# Patient Record
Sex: Male | Born: 1960
Health system: Southern US, Community
[De-identification: ages and names within clinical notes are randomized; demographics above are authoritative.]

## PROBLEM LIST (undated history)

## (undated) ENCOUNTER — Ambulatory Visit: Disposition: A | Discharge status: Home / Self Care | Payer: Medicare Other

## (undated) DIAGNOSIS — IMO0001 Reserved for inherently not codable concepts without codable children: Secondary | ICD-10-CM

## (undated) DIAGNOSIS — G4730 Sleep apnea, unspecified: Secondary | ICD-10-CM

## (undated) DIAGNOSIS — J189 Pneumonia, unspecified organism: Secondary | ICD-10-CM

## (undated) DIAGNOSIS — Z992 Dependence on renal dialysis: Secondary | ICD-10-CM

## (undated) DIAGNOSIS — E669 Obesity, unspecified: Secondary | ICD-10-CM

## (undated) DIAGNOSIS — Z973 Presence of spectacles and contact lenses: Secondary | ICD-10-CM

## (undated) DIAGNOSIS — I469 Cardiac arrest, cause unspecified: Secondary | ICD-10-CM

## (undated) DIAGNOSIS — J811 Chronic pulmonary edema: Secondary | ICD-10-CM

## (undated) DIAGNOSIS — Z95810 Presence of automatic (implantable) cardiac defibrillator: Secondary | ICD-10-CM

## (undated) DIAGNOSIS — J45909 Unspecified asthma, uncomplicated: Secondary | ICD-10-CM

## (undated) DIAGNOSIS — I509 Heart failure, unspecified: Secondary | ICD-10-CM

## (undated) DIAGNOSIS — N186 End stage renal disease: Secondary | ICD-10-CM

## (undated) DIAGNOSIS — N189 Chronic kidney disease, unspecified: Secondary | ICD-10-CM

## (undated) DIAGNOSIS — T7840XA Allergy, unspecified, initial encounter: Secondary | ICD-10-CM

## (undated) DIAGNOSIS — I2510 Atherosclerotic heart disease of native coronary artery without angina pectoris: Secondary | ICD-10-CM

## (undated) DIAGNOSIS — I10 Essential (primary) hypertension: Secondary | ICD-10-CM

## (undated) DIAGNOSIS — E785 Hyperlipidemia, unspecified: Secondary | ICD-10-CM

## (undated) DIAGNOSIS — I219 Acute myocardial infarction, unspecified: Secondary | ICD-10-CM

## (undated) HISTORY — DX: Allergy, unspecified, initial encounter: T78.40XA

## (undated) HISTORY — DX: Hyperlipidemia, unspecified: E78.5

## (undated) HISTORY — DX: Unspecified asthma, uncomplicated: J45.909

## (undated) HISTORY — DX: Chronic kidney disease, unspecified: N18.9

## (undated) HISTORY — PX: COLONOSCOPY: SHX174

## (undated) HISTORY — DX: Acute myocardial infarction, unspecified: I21.9

## (undated) HISTORY — PX: COLONOSCOPY W/ BIOPSIES AND POLYPECTOMY: SHX1376

---

## 2004-11-04 ENCOUNTER — Ambulatory Visit: Payer: Self-pay | Admitting: Family Medicine

## 2004-11-05 ENCOUNTER — Ambulatory Visit: Payer: Self-pay | Admitting: Family Medicine

## 2004-11-11 ENCOUNTER — Ambulatory Visit: Payer: Self-pay | Admitting: *Deleted

## 2004-11-13 ENCOUNTER — Ambulatory Visit: Payer: Self-pay | Admitting: Family Medicine

## 2005-07-18 ENCOUNTER — Emergency Department (HOSPITAL_COMMUNITY): Admission: EM | Admit: 2005-07-18 | Discharge: 2005-07-18 | Payer: Self-pay | Admitting: Emergency Medicine

## 2005-07-25 ENCOUNTER — Emergency Department (HOSPITAL_COMMUNITY): Admission: EM | Admit: 2005-07-25 | Discharge: 2005-07-25 | Payer: Self-pay | Admitting: Emergency Medicine

## 2005-10-21 ENCOUNTER — Emergency Department (HOSPITAL_COMMUNITY): Admission: EM | Admit: 2005-10-21 | Discharge: 2005-10-21 | Payer: Self-pay | Admitting: Family Medicine

## 2005-10-22 ENCOUNTER — Inpatient Hospital Stay (HOSPITAL_COMMUNITY): Admission: EM | Admit: 2005-10-22 | Discharge: 2005-10-24 | Payer: Self-pay | Admitting: Emergency Medicine

## 2005-12-06 DIAGNOSIS — F528 Other sexual dysfunction not due to a substance or known physiological condition: Secondary | ICD-10-CM | POA: Insufficient documentation

## 2005-12-06 HISTORY — PX: CORONARY STENT PLACEMENT: SHX1402

## 2006-01-24 ENCOUNTER — Emergency Department (HOSPITAL_COMMUNITY): Admission: EM | Admit: 2006-01-24 | Discharge: 2006-01-24 | Payer: Self-pay | Admitting: Emergency Medicine

## 2006-01-27 ENCOUNTER — Ambulatory Visit (HOSPITAL_BASED_OUTPATIENT_CLINIC_OR_DEPARTMENT_OTHER): Admission: RE | Admit: 2006-01-27 | Discharge: 2006-01-27 | Payer: Self-pay | Admitting: Otolaryngology

## 2006-01-30 ENCOUNTER — Ambulatory Visit: Payer: Self-pay | Admitting: Internal Medicine

## 2006-09-19 ENCOUNTER — Ambulatory Visit: Payer: Self-pay | Admitting: Family Medicine

## 2006-12-05 ENCOUNTER — Ambulatory Visit: Payer: Self-pay | Admitting: Family Medicine

## 2007-02-06 ENCOUNTER — Ambulatory Visit: Payer: Self-pay | Admitting: Family Medicine

## 2007-07-10 ENCOUNTER — Ambulatory Visit: Payer: Self-pay | Admitting: Family Medicine

## 2007-07-14 ENCOUNTER — Encounter (INDEPENDENT_AMBULATORY_CARE_PROVIDER_SITE_OTHER): Payer: Self-pay | Admitting: Family Medicine

## 2007-07-14 DIAGNOSIS — J45909 Unspecified asthma, uncomplicated: Secondary | ICD-10-CM | POA: Insufficient documentation

## 2007-07-14 DIAGNOSIS — I509 Heart failure, unspecified: Secondary | ICD-10-CM | POA: Insufficient documentation

## 2007-07-14 DIAGNOSIS — E669 Obesity, unspecified: Secondary | ICD-10-CM

## 2007-07-14 DIAGNOSIS — I2510 Atherosclerotic heart disease of native coronary artery without angina pectoris: Secondary | ICD-10-CM | POA: Insufficient documentation

## 2007-07-14 DIAGNOSIS — Z6841 Body Mass Index (BMI) 40.0 and over, adult: Secondary | ICD-10-CM | POA: Insufficient documentation

## 2007-07-14 DIAGNOSIS — I5040 Unspecified combined systolic (congestive) and diastolic (congestive) heart failure: Secondary | ICD-10-CM | POA: Insufficient documentation

## 2007-07-14 DIAGNOSIS — E785 Hyperlipidemia, unspecified: Secondary | ICD-10-CM | POA: Insufficient documentation

## 2007-07-14 LAB — CONVERTED CEMR LAB
HDL: 46 mg/dL
LDL Cholesterol: 84 mg/dL
Triglycerides: 63 mg/dL

## 2007-08-23 ENCOUNTER — Encounter (INDEPENDENT_AMBULATORY_CARE_PROVIDER_SITE_OTHER): Payer: Self-pay | Admitting: *Deleted

## 2007-10-23 ENCOUNTER — Encounter (INDEPENDENT_AMBULATORY_CARE_PROVIDER_SITE_OTHER): Payer: Self-pay | Admitting: Family Medicine

## 2007-10-24 ENCOUNTER — Telehealth (INDEPENDENT_AMBULATORY_CARE_PROVIDER_SITE_OTHER): Payer: Self-pay | Admitting: *Deleted

## 2007-10-25 ENCOUNTER — Ambulatory Visit (HOSPITAL_COMMUNITY): Admission: RE | Admit: 2007-10-25 | Discharge: 2007-10-25 | Payer: Self-pay | Admitting: Family Medicine

## 2007-10-25 ENCOUNTER — Ambulatory Visit: Payer: Self-pay | Admitting: Family Medicine

## 2007-11-08 ENCOUNTER — Ambulatory Visit: Payer: Self-pay | Admitting: Family Medicine

## 2007-11-08 DIAGNOSIS — K029 Dental caries, unspecified: Secondary | ICD-10-CM | POA: Insufficient documentation

## 2008-01-23 ENCOUNTER — Emergency Department (HOSPITAL_COMMUNITY): Admission: EM | Admit: 2008-01-23 | Discharge: 2008-01-23 | Payer: Self-pay | Admitting: Family Medicine

## 2008-03-14 ENCOUNTER — Emergency Department (HOSPITAL_COMMUNITY): Admission: EM | Admit: 2008-03-14 | Discharge: 2008-03-14 | Payer: Self-pay | Admitting: Emergency Medicine

## 2008-07-03 ENCOUNTER — Emergency Department (HOSPITAL_COMMUNITY): Admission: EM | Admit: 2008-07-03 | Discharge: 2008-07-03 | Payer: Self-pay | Admitting: Family Medicine

## 2008-08-23 ENCOUNTER — Telehealth (INDEPENDENT_AMBULATORY_CARE_PROVIDER_SITE_OTHER): Payer: Self-pay | Admitting: *Deleted

## 2008-10-01 ENCOUNTER — Ambulatory Visit: Payer: Self-pay | Admitting: Family Medicine

## 2008-10-01 DIAGNOSIS — I10 Essential (primary) hypertension: Secondary | ICD-10-CM | POA: Insufficient documentation

## 2008-10-01 DIAGNOSIS — K625 Hemorrhage of anus and rectum: Secondary | ICD-10-CM | POA: Insufficient documentation

## 2008-10-01 LAB — CONVERTED CEMR LAB
ALT: 28 units/L (ref 0–53)
AST: 25 units/L (ref 0–37)
Albumin: 4.3 g/dL (ref 3.5–5.2)
Alkaline Phosphatase: 112 units/L (ref 39–117)
BUN: 19 mg/dL (ref 6–23)
Basophils Absolute: 0 10*3/uL (ref 0.0–0.1)
Basophils Relative: 1 % (ref 0–1)
Bilirubin Urine: NEGATIVE
Blood in Urine, dipstick: NEGATIVE
CO2: 24 meq/L (ref 19–32)
Calcium: 9.2 mg/dL (ref 8.4–10.5)
Chloride: 104 meq/L (ref 96–112)
Cholesterol: 126 mg/dL (ref 0–200)
Creatinine, Ser: 1.23 mg/dL (ref 0.40–1.50)
Eosinophils Absolute: 0.2 10*3/uL (ref 0.0–0.7)
Eosinophils Relative: 4 % (ref 0–5)
Glucose, Bld: 99 mg/dL (ref 70–99)
Glucose, Urine, Semiquant: NEGATIVE
HCT: 45.8 % (ref 39.0–52.0)
HDL: 42 mg/dL (ref >39)
Hemoglobin: 15.4 g/dL (ref 13.0–17.0)
Ketones, urine, test strip: NEGATIVE
LDL Cholesterol: 62 mg/dL (ref 0–99)
Lymphocytes Relative: 28 % (ref 12–46)
Lymphs Abs: 1.4 10*3/uL (ref 0.7–4.0)
MCHC: 33.6 g/dL (ref 30.0–36.0)
MCV: 93.5 fL (ref 78.0–100.0)
Monocytes Absolute: 0.5 10*3/uL (ref 0.1–1.0)
Monocytes Relative: 10 % (ref 3–12)
Neutro Abs: 2.8 10*3/uL (ref 1.7–7.7)
Neutrophils Relative %: 57 % (ref 43–77)
Nitrite: NEGATIVE
PSA: 0.76 ng/mL (ref 0.10–4.00)
Platelets: 201 10*3/uL (ref 150–400)
Potassium: 4 meq/L (ref 3.5–5.3)
Protein, U semiquant: 100
RBC: 4.9 M/uL (ref 4.22–5.81)
RDW: 14.4 % (ref 11.5–15.5)
Sodium: 142 meq/L (ref 135–145)
Specific Gravity, Urine: 1.02
Total Bilirubin: 0.7 mg/dL (ref 0.3–1.2)
Total CHOL/HDL Ratio: 3
Total Protein: 7.2 g/dL (ref 6.0–8.3)
Triglycerides: 110 mg/dL (ref <150)
Urobilinogen, UA: 0.2
VLDL: 22 mg/dL (ref 0–40)
WBC Urine, dipstick: NEGATIVE
WBC: 4.9 10*3/uL (ref 4.0–10.5)
pH: 6.5

## 2008-10-02 ENCOUNTER — Encounter (INDEPENDENT_AMBULATORY_CARE_PROVIDER_SITE_OTHER): Payer: Self-pay | Admitting: Family Medicine

## 2008-10-16 ENCOUNTER — Ambulatory Visit (HOSPITAL_COMMUNITY): Admission: RE | Admit: 2008-10-16 | Discharge: 2008-10-16 | Payer: Self-pay | Admitting: Gastroenterology

## 2008-11-15 ENCOUNTER — Telehealth (INDEPENDENT_AMBULATORY_CARE_PROVIDER_SITE_OTHER): Payer: Self-pay | Admitting: *Deleted

## 2008-11-24 ENCOUNTER — Emergency Department (HOSPITAL_COMMUNITY): Admission: EM | Admit: 2008-11-24 | Discharge: 2008-11-24 | Payer: Self-pay | Admitting: Family Medicine

## 2008-11-30 ENCOUNTER — Emergency Department (HOSPITAL_COMMUNITY): Admission: EM | Admit: 2008-11-30 | Discharge: 2008-12-01 | Payer: Self-pay | Admitting: Emergency Medicine

## 2009-06-03 ENCOUNTER — Ambulatory Visit: Payer: Self-pay | Admitting: Physician Assistant

## 2009-06-03 ENCOUNTER — Encounter (INDEPENDENT_AMBULATORY_CARE_PROVIDER_SITE_OTHER): Payer: Self-pay | Admitting: *Deleted

## 2009-06-03 DIAGNOSIS — IMO0002 Reserved for concepts with insufficient information to code with codable children: Secondary | ICD-10-CM | POA: Insufficient documentation

## 2009-06-03 DIAGNOSIS — F17200 Nicotine dependence, unspecified, uncomplicated: Secondary | ICD-10-CM | POA: Insufficient documentation

## 2009-06-24 ENCOUNTER — Emergency Department (HOSPITAL_COMMUNITY): Admission: EM | Admit: 2009-06-24 | Discharge: 2009-06-24 | Payer: Self-pay | Admitting: Emergency Medicine

## 2009-09-09 ENCOUNTER — Telehealth: Payer: Self-pay | Admitting: Physician Assistant

## 2009-09-23 ENCOUNTER — Telehealth: Payer: Self-pay | Admitting: Physician Assistant

## 2009-09-23 ENCOUNTER — Ambulatory Visit: Payer: Self-pay | Admitting: Physician Assistant

## 2009-09-26 ENCOUNTER — Encounter: Payer: Self-pay | Admitting: Physician Assistant

## 2009-09-26 DIAGNOSIS — K644 Residual hemorrhoidal skin tags: Secondary | ICD-10-CM | POA: Insufficient documentation

## 2009-09-26 LAB — CONVERTED CEMR LAB
ALT: 22 units/L (ref 0–53)
AST: 28 units/L (ref 0–37)
Albumin: 4.1 g/dL (ref 3.5–5.2)
Alkaline Phosphatase: 105 units/L (ref 39–117)
BUN: 22 mg/dL (ref 6–23)
Basophils Absolute: 0 10*3/uL (ref 0.0–0.1)
Basophils Relative: 1 % (ref 0–1)
CO2: 23 meq/L (ref 19–32)
Calcium: 9.5 mg/dL (ref 8.4–10.5)
Chloride: 107 meq/L (ref 96–112)
Creatinine, Ser: 1.72 mg/dL — ABNORMAL HIGH (ref 0.40–1.50)
Eosinophils Absolute: 0.2 10*3/uL (ref 0.0–0.7)
Eosinophils Relative: 4 % (ref 0–5)
Glucose, Bld: 93 mg/dL (ref 70–99)
HCT: 43.9 % (ref 39.0–52.0)
Hemoglobin: 15.2 g/dL (ref 13.0–17.0)
Lymphocytes Relative: 34 % (ref 12–46)
Lymphs Abs: 1.9 10*3/uL (ref 0.7–4.0)
MCHC: 34.6 g/dL (ref 30.0–36.0)
MCV: 90.3 fL (ref 78.0–100.0)
Monocytes Absolute: 0.7 10*3/uL (ref 0.1–1.0)
Monocytes Relative: 12 % (ref 3–12)
Neutro Abs: 2.8 10*3/uL (ref 1.7–7.7)
Neutrophils Relative %: 49 % (ref 43–77)
Platelets: 210 10*3/uL (ref 150–400)
Potassium: 3.7 meq/L (ref 3.5–5.3)
RBC: 4.86 M/uL (ref 4.22–5.81)
RDW: 14.2 % (ref 11.5–15.5)
Sodium: 143 meq/L (ref 135–145)
Total Bilirubin: 0.5 mg/dL (ref 0.3–1.2)
Total Protein: 7.1 g/dL (ref 6.0–8.3)
WBC: 5.6 10*3/uL (ref 4.0–10.5)

## 2010-01-16 ENCOUNTER — Ambulatory Visit: Payer: Self-pay | Admitting: Physician Assistant

## 2010-01-16 DIAGNOSIS — IMO0002 Reserved for concepts with insufficient information to code with codable children: Secondary | ICD-10-CM | POA: Insufficient documentation

## 2010-01-16 DIAGNOSIS — N4889 Other specified disorders of penis: Secondary | ICD-10-CM | POA: Insufficient documentation

## 2010-01-16 LAB — CONVERTED CEMR LAB
ALT: 21 units/L (ref 0–53)
AST: 22 units/L (ref 0–37)
Albumin: 4.3 g/dL (ref 3.5–5.2)
Alkaline Phosphatase: 114 units/L (ref 39–117)
BUN: 19 mg/dL (ref 6–23)
CO2: 24 meq/L (ref 19–32)
Calcium: 9.3 mg/dL (ref 8.4–10.5)
Chloride: 105 meq/L (ref 96–112)
Creatinine, Ser: 1.48 mg/dL (ref 0.40–1.50)
Glucose, Bld: 98 mg/dL (ref 70–99)
Potassium: 3.9 meq/L (ref 3.5–5.3)
Sodium: 141 meq/L (ref 135–145)
Total Bilirubin: 0.5 mg/dL (ref 0.3–1.2)
Total Protein: 7.3 g/dL (ref 6.0–8.3)

## 2010-01-19 ENCOUNTER — Encounter: Payer: Self-pay | Admitting: Physician Assistant

## 2010-01-22 ENCOUNTER — Encounter (INDEPENDENT_AMBULATORY_CARE_PROVIDER_SITE_OTHER): Payer: Self-pay | Admitting: Internal Medicine

## 2010-01-22 ENCOUNTER — Ambulatory Visit (HOSPITAL_COMMUNITY): Admission: RE | Admit: 2010-01-22 | Discharge: 2010-01-22 | Payer: Self-pay | Admitting: Internal Medicine

## 2010-02-10 ENCOUNTER — Encounter: Payer: Self-pay | Admitting: Physician Assistant

## 2010-02-16 ENCOUNTER — Ambulatory Visit: Payer: Self-pay | Admitting: Physician Assistant

## 2010-02-16 DIAGNOSIS — G4733 Obstructive sleep apnea (adult) (pediatric): Secondary | ICD-10-CM | POA: Insufficient documentation

## 2010-02-16 DIAGNOSIS — I255 Ischemic cardiomyopathy: Secondary | ICD-10-CM | POA: Insufficient documentation

## 2010-02-16 DIAGNOSIS — I420 Dilated cardiomyopathy: Secondary | ICD-10-CM | POA: Insufficient documentation

## 2010-02-17 DIAGNOSIS — R809 Proteinuria, unspecified: Secondary | ICD-10-CM | POA: Insufficient documentation

## 2010-02-17 LAB — CONVERTED CEMR LAB
Bilirubin Urine: NEGATIVE
Casts: NONE SEEN /lpf
Crystals: NONE SEEN
Hemoglobin, Urine: NEGATIVE
Ketones, ur: NEGATIVE mg/dL
Leukocytes, UA: NEGATIVE
Nitrite: NEGATIVE
Protein, ur: 100 mg/dL — AB
Specific Gravity, Urine: 1.018 (ref 1.005–1.030)
Squamous Epithelial / HPF: NONE SEEN /lpf
Urine Glucose: NEGATIVE mg/dL
Urobilinogen, UA: 0.2 (ref 0.0–1.0)
pH: 6.5 (ref 5.0–8.0)

## 2010-02-24 ENCOUNTER — Encounter: Payer: Self-pay | Admitting: Physician Assistant

## 2010-02-24 ENCOUNTER — Ambulatory Visit: Payer: Self-pay | Admitting: Physician Assistant

## 2010-02-24 ENCOUNTER — Telehealth: Payer: Self-pay | Admitting: Physician Assistant

## 2010-02-25 ENCOUNTER — Encounter: Payer: Self-pay | Admitting: Physician Assistant

## 2010-02-25 DIAGNOSIS — N259 Disorder resulting from impaired renal tubular function, unspecified: Secondary | ICD-10-CM | POA: Insufficient documentation

## 2010-02-25 LAB — CONVERTED CEMR LAB
BUN: 22 mg/dL (ref 6–23)
CO2: 27 meq/L (ref 19–32)
Calcium: 9 mg/dL (ref 8.4–10.5)
Chloride: 102 meq/L (ref 96–112)
Collection Interval-CRCL: 24 hr
Creatinine 24 HR UR: 1352 mg/24hr (ref 800–2000)
Creatinine Clearance: 65 mL/min — ABNORMAL LOW (ref 75–125)
Creatinine, Ser: 1.45 mg/dL (ref 0.40–1.50)
Creatinine, Urine: 169 mg/dL
Glucose, Bld: 88 mg/dL (ref 70–99)
Potassium: 3.6 meq/L (ref 3.5–5.3)
Protein, Ur: 1104 mg/24hr — ABNORMAL HIGH (ref 50–100)
Sodium: 140 meq/L (ref 135–145)

## 2010-03-10 ENCOUNTER — Ambulatory Visit (HOSPITAL_COMMUNITY): Admission: RE | Admit: 2010-03-10 | Discharge: 2010-03-10 | Payer: Self-pay | Admitting: Internal Medicine

## 2010-03-11 ENCOUNTER — Telehealth: Payer: Self-pay | Admitting: Physician Assistant

## 2010-03-19 ENCOUNTER — Ambulatory Visit: Payer: Self-pay | Admitting: Physician Assistant

## 2010-03-19 LAB — CONVERTED CEMR LAB
BUN: 22 mg/dL (ref 6–23)
CO2: 26 meq/L (ref 19–32)
Calcium: 9.1 mg/dL (ref 8.4–10.5)
Chloride: 103 meq/L (ref 96–112)
Creatinine, Ser: 1.61 mg/dL — ABNORMAL HIGH (ref 0.40–1.50)
Glucose, Bld: 75 mg/dL (ref 70–99)
Potassium: 4 meq/L (ref 3.5–5.3)
Sodium: 142 meq/L (ref 135–145)

## 2010-03-20 ENCOUNTER — Encounter: Payer: Self-pay | Admitting: Physician Assistant

## 2010-04-24 ENCOUNTER — Observation Stay (HOSPITAL_COMMUNITY): Admission: EM | Admit: 2010-04-24 | Discharge: 2010-04-24 | Payer: Self-pay | Admitting: Emergency Medicine

## 2010-04-24 ENCOUNTER — Encounter: Payer: Self-pay | Admitting: Physician Assistant

## 2010-04-24 DIAGNOSIS — T783XXA Angioneurotic edema, initial encounter: Secondary | ICD-10-CM | POA: Insufficient documentation

## 2010-05-13 ENCOUNTER — Encounter: Payer: Self-pay | Admitting: Physician Assistant

## 2010-06-09 ENCOUNTER — Observation Stay: Payer: Self-pay | Admitting: Internal Medicine

## 2010-07-13 ENCOUNTER — Telehealth (INDEPENDENT_AMBULATORY_CARE_PROVIDER_SITE_OTHER): Payer: Self-pay | Admitting: *Deleted

## 2011-01-05 NOTE — Progress Notes (Signed)
  Phone Note Other Incoming   Summary of Call: received request for viagra refill patient not seen in a while and was a no show last week needs f/u appointment before viagra can be filled again will give one refill and then no refills until seen  please make patient aware Initial call taken by: [name] PA-C,  September 09, 2009 2:09 PM    Prescriptions: VIAGRA 100 MG TABS (SILDENAFIL CITRATE) 1 by mouth 1 hour prior to sex  #10 x 0   Entered and Authorized by:   [name] PA-C   Signed by:   [name] PA-C on 09/09/2009   Method used:   Faxed to ...       [facility] (retail)       [place], [place]  [id number]       Ph: RN:[phone] [place]       Fax: [phone]   RxID:   [id number]   Appended Document:  Left message on answering machine for pt to call back...  Appended Document:  spoke with pt

## 2011-01-05 NOTE — Assessment & Plan Note (Signed)
Summary: 2 WEEK ASTHMA FU///KT   Vital Signs:  Patient Profile:   50 Years Old Male Height:     66.5 inches Weight:      249 pounds BMI:     39.73 BSA:     2.21 Temp:     98.6 degrees F oral Pulse rate:   100 / minute Pulse rhythm:   regular Resp:     20 per minute BP sitting:   136 / 96  (left arm) Cuff size:   regular  Pt. in pain?   yes    Location:   tooth    Intensity:   7    Type:       aching              Is Patient Diabetic? No Comments pt did not bring medications but states he is taking them. MD did PF meter with patient 410',500, 520.     Chief Complaint:  2 week asthma follow-up and tooth pain.  History of Present Illness: #1 Pt here for f/u on asthma flare. He reports compliance with meds. Cough much better. No SOB. #2 Pt's main concern is painful tooth that has worsened over last few weeks.  Current Allergies: ! LISINOPRIL ! LIDOCAINE HCL (LIDOCAINE HCL (LOCAL ANESTH.))  Past Medical History:    Reviewed history from 07/14/2007 and no changes required:       Current Problems:        OBESITY (ICD-278.00)       CHF (ICD-428.0)       HYPERLIPIDEMIA NEC/NOS (ICD-272.4)       CAD (ICD-414.00) sees Dr.Harwani       ASTHMA (ICD-493.90) dx 2000       PROTEINURIA dx12/9/05  Past Surgical History:    Denies surgical history    Risk Factors: Tobacco use:  current    Year started:  1989 Drug use:  no HIV high-risk behavior:  no Caffeine use:  0 drinks per day Alcohol use:  no Exercise:  no Seatbelt use:  100 % Sun Exposure:  frequently  Family History Risk Factors:    Family History of MI in females < 27 years old:  no    Family History of MI in males < 70 years old:  no    Physical Exam  General:     Well-developed,well-nourished,in no acute distress; alert,appropriate and cooperative throughout examination Mouth:     Needs dental care.Has bad cavity with swelling and pus bottom right molar. Lungs:     normal respiratory effort, no  intercostal retractions, no accessory muscle use, normal breath sounds, no crackles, and no wheezes.   Heart:     Normal rate and regular rhythm. S1 and S2 normal without gallop, murmur, click, rub or other extra sounds.    Impression & Recommendations:  Problem # 1:  ASTHMA (ICD-493.90) Much improved Cont.current meds. FLU shot His updated medication list for this problem includes:    Albuterol 90 Mcg/act Aers (Albuterol) ..... Use 2 puffs every 4-6 hours as needed wheezing and cough    Advair Diskus 100-50 Mcg/dose Misc (Fluticasone-salmeterol) .Marland Kitchen... Take 1 inhalation two times a day   Problem # 2:  DENTAL CARIES (ICD-521.00) Given amoxicillin 500mg  Take 1 tablet by mouth every 8 hours x 10 days  Orders: Dental Referral (Dentist)   Complete Medication List: 1)  Viagra 100 Mg Tabs (Sildenafil citrate) .Marland Kitchen.. 1 by mouth 1 hour prior to sex 2)  Caduet 10-40 Mg Tabs (Amlodipine-atorvastatin) .Marland KitchenMarland KitchenMarland Kitchen 1  by mouth q am 3)  Hyzaar 100-25 Mg Tabs (Losartan potassium-hctz) .Marland Kitchen.. 1 by mouth q am 4)  Niaspan 500 Mg Tbcr (Niacin (antihyperlipidemic)) .Marland Kitchen.. 1 by mouth once daily 5)  Toprol Xl 100 Mg Tb24 (Metoprolol succinate) .... Take 1 tablet by mouth q 12hrs 6)  Aspir-low 81 Mg Tbec (Aspirin) .Marland Kitchen.. 1 by mouth once daily 7)  Benazepril Hcl 20 Mg Tabs (Benazepril hcl) .Marland Kitchen.. 1 by mouth once daily 8)  Albuterol 90 Mcg/act Aers (Albuterol) .... Use 2 puffs every 4-6 hours as needed wheezing and cough 9)  Advair Diskus 100-50 Mcg/dose Misc (Fluticasone-salmeterol) .... Take 1 inhalation two times a day 10)  Amoxicillin 500 Mg Caps (Amoxicillin) .... Take 1 capsule by mouth every 8 hoursx 10 days  Other Orders: Flu Vaccine 28yrs + [id number]) Admin 1st Vaccine [id number])   Patient Instructions: 1)  Please schedule a follow-up appointment in 1 year.    Prescriptions: AMOXICILLIN 500 MG  CAPS (AMOXICILLIN) Take 1 capsule by mouth every 8 hoursx 10 days  #30 x 0   Entered and Authorized by:   Milagros Evener MD   Signed by:   Milagros Evener MD on 11/08/2007   Method used:   Print then Give to Patient   RxID:   [id number]  ]  Influenza Vaccine    Vaccine Type: Fluvax 3+    Site: right deltoid    Mfr: [place]    Dose: 0.5 ml    Route: IM    Given by: Vinetta Bergamo CMA    Exp. Date: 06/04/2008    Lot #: [id number]    VIS given: 06/29/07 version given November 08, 2007.  Flu Vaccine Consent Questions    Do you have a history of severe allergic reactions to this vaccine? no    Any prior history of allergic reactions to egg and/or gelatin? no    Do you have a sensitivity to the preservative Thimersol? no    Do you have a past history of Guillan-Barre Syndrome? no    Do you currently have an acute febrile illness? no    Have you ever had a severe reaction to latex? no    Vaccine information given and explained to patient? yes

## 2011-01-05 NOTE — Assessment & Plan Note (Signed)
Summary: office visit:SOB    Vital Signs:  Patient Profile:   50 Years Old Male Height:     66.5 inches Weight:      253 pounds BMI:     40.37 BSA:     2.22 Temp:     97.2 degrees F oral Pulse rate:   60 / minute Pulse rhythm:   irregularly irregular Resp:     20 per minute BP sitting:   140 / 100  (left arm) Cuff size:   regular  Pt. in pain?   no  Vitals Entered By: Bridgett Larsson RN (October 25, 2007 8:34 AM)              Is Patient Diabetic? No  Does patient need assistance? Ambulation Normal       Chief Complaint:  SOB.  History of Present Illness: Here c/o worsening wheezing over the last 3 weeks. Pt says gets SOB with exercise and notes wheezing. Reports h/o asthma x 2years. Denies CP,chest pressure.No lower extremity edema.Denies difficulties with SOB lying down/sleeping. No fever,rhinorrhea,sorethroat,or nasal congestion Pt smokes cigarretes. Has not seen his cardiologist since 2007. Has not used albuterol MDI.   Records from [facility] requested by MD.They do not have a prior EKG on file.  Current Allergies (reviewed today): ! LISINOPRIL ! LIDOCAINE HCL (LIDOCAINE HCL (LOCAL ANESTH.))  Past Medical History:    Reviewed history from 07/14/2007 and no changes required:       Current Problems:        OBESITY (ICD-278.00)       CHF (ICD-428.0)       HYPERLIPIDEMIA NEC/NOS (ICD-272.4)       CAD (ICD-414.00) sees Dr.Harwani       ASTHMA (ICD-493.90) dx 2000       PROTEINURIA dx12/9/05    Risk Factors:  Tobacco use:  current    Year started:  1989 Drug use:  no HIV high-risk behavior:  no Caffeine use:  0 drinks per day Alcohol use:  no Exercise:  no Seatbelt use:  100 % Sun Exposure:  frequently  Family History Risk Factors:    Family History of MI in females < 66 years old:  no    Family History of MI in males < 48 years old:  no   Review of Systems      See HPI   Physical Exam  General:     Well-developed,well-nourished,in no  acute distress; alert,appropriate and cooperative throughout examination Pt prefers to lie down as has no SOB supine. Lungs:     Good airmovement bilaterally. No overt wheezing but patient has gravely voice and noteable upper airway noise. Slightly prolonged expiratory phase. No crackles or rhonchi appreciated.  10:00am s/p albuterol SVN: Pt prone on table and sleeping. Says feels better after albuterol treatment. CTA bilaterally. Normal expiratory phase.his post-SVN PF are now 470-500. Heart:     irregular rhythm and extrasystoles noted.  normal rate  but extra beats noted and EKG shows sinus rhythm and PVC's. No acute ischemic changes noted.  s/p SVN still sinus rhythm and rare PVC on palpation.  Extremities:     No CCE.    Impression & Recommendations:  Problem # 1:  ASTHMA (ICD-493.90) to f/u with MD in 2 weeks for recheck and Flu shot His updated medication list for this problem includes:    Albuterol 90 Mcg/act Aers (Albuterol) ..... Use 2 puffs every 4-6 hours as needed wheezing and cough    Advair Diskus 100-50 Mcg/dose Misc (  Fluticasone-salmeterol) .Marland Kitchen... Take 1 inhalation two times a day  Orders: Peak Flow Rate (94150) Pulse Oximetry (single measurment) (94760) CXR- 2view (CXR)   Problem # 2:  CHF (ICD-428.0) Symptoms and exam c/w asthma. Will check baseline CXR. His weight is stable and EKG shows sinus rhythm and PVC's. His updated medication list for this problem includes:    Hyzaar 100-25 Mg Tabs (Losartan potassium-hctz) .Marland Kitchen... 1 by mouth q am    Toprol Xl 100 Mg Tb24 (Metoprolol succinate) .Marland Kitchen... Take 1 tablet by mouth q 12hrs    Aspir-low 81 Mg Tbec (Aspirin) .Marland Kitchen... 1 by mouth once daily    Benazepril Hcl 20 Mg Tabs (Benazepril hcl) .Marland Kitchen... 1 by mouth once daily   Complete Medication List: 1)  Viagra 100 Mg Tabs (Sildenafil citrate) .Marland Kitchen.. 1 by mouth 1 hour prior to sex 2)  Caduet 10-40 Mg Tabs (Amlodipine-atorvastatin) .Marland Kitchen.. 1 by mouth q am 3)  Hyzaar 100-25 Mg Tabs  (Losartan potassium-hctz) .Marland Kitchen.. 1 by mouth q am 4)  Niaspan 500 Mg Tbcr (Niacin (antihyperlipidemic)) .Marland Kitchen.. 1 by mouth once daily 5)  Toprol Xl 100 Mg Tb24 (Metoprolol succinate) .... Take 1 tablet by mouth q 12hrs 6)  Aspir-low 81 Mg Tbec (Aspirin) .Marland Kitchen.. 1 by mouth once daily 7)  Benazepril Hcl 20 Mg Tabs (Benazepril hcl) .Marland Kitchen.. 1 by mouth once daily 8)  Albuterol 90 Mcg/act Aers (Albuterol) .... Use 2 puffs every 4-6 hours as needed wheezing and cough 9)  Advair Diskus 100-50 Mcg/dose Misc (Fluticasone-salmeterol) .... Take 1 inhalation two times a day  Other Orders: EKG w/ Interpretation (93000)   Patient Instructions: 1)  Please schedule a follow-up appointment in 2 weeks.asthma    Prescriptions: ADVAIR DISKUS 100-50 MCG/DOSE MISC (FLUTICASONE-SALMETEROL) Take 1 inhalation two times a day  #1 x 4   Entered and Authorized by:   Milagros Evener MD   Signed by:   Milagros Evener MD on 10/25/2007   Method used:   Print then Give to Patient   RxID:   [id number] ALBUTEROL 90 MCG/ACT AERS (ALBUTEROL) Use 2 puffs every 4-6 hours as needed wheezing and cough  #1 x 4   Entered and Authorized by:   Milagros Evener MD   Signed by:   Milagros Evener MD on 10/25/2007   Method used:   Print then Give to Patient   [id number]  ]

## 2011-01-05 NOTE — Assessment & Plan Note (Signed)
Summary: OFFFICE VISIT/GK   Vital Signs:  Patient profile:   50 year old male Height:      66.5 inches Weight:      246 pounds BMI:     39.25 Temp:     98.2 degrees F oral Pulse rate:   92 / minute Pulse rhythm:   regular Resp:     18 per minute BP sitting:   132 / 85  (left arm) Cuff size:   large  Vitals Entered By: [place] Shannon (September 23, 2009 3:55 PM) CC: ov.... pt need refills on albuterol and viagra.... pt says back in Aug. and Sept he was bleeding from rectal but not this month, Hypertension Management Is Patient Diabetic? No Pain Assessment Patient in pain? no       Does patient need assistance? Functional Status Self care Ambulation Normal   CC:  ov.... pt need refills on albuterol and viagra.... pt says back in Aug. and Sept he was bleeding from rectal but not this month and Hypertension Management.  History of Present Illness: 50 year old male who presents for followup.  When I first met him a few months ago, he was supposed to come back in a couple of weeks.  I have not seen him since that time.  He has a history of rectal bleeding.  He did undergo colonoscopy with Dr. Velora Heckler in November of 2009.  I have been unable to locate those results.  We have contacted the hospital and they are to fax me a copy of the colonoscopy results.  He says he continues to have blood in his stools ever since it was first noted a year ago.  There has been no significant change.  He has times where he sees no bleeding.  The blood is on top of the stool.  He denies any itching or burning.  He does sometimes have to strain.  He cannot really relate increased bleeding to straining.  He is still taking Chantix.  He has reduced his smoking from 2 packs per day to 6-7 cigarettes per day.  He reports a history of asthma.  In looking through the records, Dr. Dahlia Bailiff placed him on Advair and albuterol sometime last year.  He ran out of his medications recently.  He says that his breathing  has worsened since running out of these medications.  He has a history of coronary artery disease.  He also is a history of systolic dysfunction.  He has not had an echocardiogram that I can tell.  He describes NYHA class II symptoms.  He denies orthopnea or paroxysmal nocturnal dyspnea.  He denies significant pedal edema.  He denies chest pain.  He would like more pills with his Viagra prescription.  He states that the 10 pills he currently gets is not enough for the month.  He tells me he is married and is monogamous.   Hypertension History:      He complains of dyspnea with exertion, but denies chest pain, orthopnea, peripheral edema, and syncope.        Positive major cardiovascular risk factors include male age 31 years old or older, hyperlipidemia, hypertension, and current tobacco user.  Negative major cardiovascular risk factors include negative family history for ischemic heart disease.        Positive history for target organ damage include ASHD (either angina/prior MI/prior CABG) and cardiac end organ damage (either CHF or LVH).     Habits & Providers  Alcohol-Tobacco-Diet     Tobacco Status:  current  Problems Prior to Update: 1)  Smoker  (ICD-305.1) 2)  Groin Strain, Right  (ICD-848.8) 3)  Essential Hypertension  (ICD-401.9) 4)  Examination, Routine Medical  (ICD-V70.0) 5)  Rectal Bleeding  (ICD-569.3) 6)  Dental Caries  (ICD-521.00) 7)  Erectile Dysfunction  (ICD-302.72) 8)  Obesity  (ICD-278.00) 9)  CHF  (ICD-428.0) 10)  Hyperlipidemia Nec/nos  (ICD-272.4) 11)  Cad  (ICD-414.00) 12)  Asthma  (ICD-493.90)  Current Medications (verified): 1)  Viagra 100 Mg Tabs (Sildenafil Citrate) .Marland Kitchen.. 1 By Mouth 1 Hour Prior To Sex 2)  Caduet 10-40 Mg Tabs (Amlodipine-Atorvastatin) .Marland Kitchen.. 1 By Mouth Q Am 3)  Hyzaar 100-25 Mg Tabs (Losartan Potassium-Hctz) .Marland Kitchen.. 1 By Mouth Q Am 4)  Niaspan 500 Mg Tbcr (Niacin (Antihyperlipidemic)) .Marland Kitchen.. 1 By Mouth Once Daily 5)  Toprol Xl 100 Mg Tb24  (Metoprolol Succinate) .... Take 1 Tablet By Mouth Q 12hrs 6)  Aspir-Low 81 Mg Tbec (Aspirin) .Marland Kitchen.. 1 By Mouth Once Daily 7)  Benazepril Hcl 20 Mg  Tabs (Benazepril Hcl) .Marland Kitchen.. 1 By Mouth Once Daily 8)  Albuterol 90 Mcg/act Aers (Albuterol) .... Use 2 Puffs Every 4-6 Hours As Needed Wheezing and Cough 9)  Advair Diskus 100-50 Mcg/dose Misc (Fluticasone-Salmeterol) .... Take 1 Inhalation Two Times A Day 10)  Naprosyn 375 Mg Tabs (Naproxen) .Marland Kitchen.. 1 By Mouth Two Times A Day For 1 Week; Then As Needed. 11)  Chantix Starting Month Pak 0.5 Mg X 11 & 1 Mg X 42 Tabs (Varenicline Tartrate) .... As Directed 12)  Chantix Continuing Month Pak 1 Mg Tabs (Varenicline Tartrate) .... As Directed  Allergies (verified): 1)  ! Lisinopril 2)  ! Lidocaine Hcl (Lidocaine Hcl (Local Anesth.))  Past History:  Past Medical History: Last updated: 07/14/2007 Current Problems:  OBESITY (ICD-278.00) CHF (ICD-428.0) HYPERLIPIDEMIA NEC/NOS (ICD-272.4) CAD (ICD-414.00) sees Dr.Harwani ASTHMA (ICD-493.90) dx 2000 PROTEINURIA dx12/9/05  Physical Exam  General:  alert, well-developed, and well-nourished.   Head:  normocephalic and atraumatic.   Neck:  supple and no JVD.   Lungs:  normal breath sounds, no crackles, and no wheezes.   Heart:  normal rate and regular rhythm.   Abdomen:  soft and non-tender.   Rectal:  normal sphincter tone and external hemorrhoid(s). HEME negative    Neurologic:  alert & oriented X3 and cranial nerves II-XII intact.     Impression & Recommendations:  Problem # 1:  RECTAL BLEEDING (ICD-569.3) get colo results prob from hemorrhoids check CBC drink Metamucil daily proctosol HC   Orders: T-CBC w/Diff [id number])  Problem # 2:  ESSENTIAL HYPERTENSION (ICD-401.9) controlled check labs  His updated medication list for this problem includes:    Caduet 10-40 Mg Tabs (Amlodipine-atorvastatin) .Marland Kitchen... 1 by mouth q am    Hyzaar 100-25 Mg Tabs (Losartan potassium-hctz) .Marland Kitchen... 1 by  mouth q am    Toprol Xl 100 Mg Tb24 (Metoprolol succinate) .Marland Kitchen... Take 1 tablet by mouth q 12hrs    Benazepril Hcl 20 Mg Tabs (Benazepril hcl) .Marland Kitchen... 1 by mouth once daily  Orders: T-Comprehensive Metabolic Panel ([id number])  Problem # 3:  CAD (ICD-414.00) needs Echo  His updated medication list for this problem includes:    Caduet 10-40 Mg Tabs (Amlodipine-atorvastatin) .Marland Kitchen... 1 by mouth q am    Hyzaar 100-25 Mg Tabs (Losartan potassium-hctz) .Marland Kitchen... 1 by mouth q am    Toprol Xl 100 Mg Tb24 (Metoprolol succinate) .Marland Kitchen... Take 1 tablet by mouth q 12hrs    Aspir-low 81 Mg Tbec (Aspirin) .Marland Kitchen... 1 by  mouth once daily    Benazepril Hcl 20 Mg Tabs (Benazepril hcl) .Marland Kitchen... 1 by mouth once daily  Orders: 2 D Echo (2 D Echo)  Problem # 4:  ERECTILE DYSFUNCTION (ICD-302.72) refill viagra  His updated medication list for this problem includes:    Viagra 100 Mg Tabs (Sildenafil citrate) .Marland Kitchen... 1 by mouth 1 hour prior to sex  Problem # 5:  SMOKER (ICD-305.1) doing good with trying to quit  His updated medication list for this problem includes:    Chantix Starting Month Pak 0.5 Mg X 11 & 1 Mg X 42 Tabs (Varenicline tartrate) .Marland Kitchen... As directed    Chantix Continuing Month Pak 1 Mg Tabs (Varenicline tartrate) .Marland Kitchen... As directed  Problem # 6:  ASTHMA (ICD-493.90) suspect probably COPD refill meds consider PFTs at CPE  His updated medication list for this problem includes:    Albuterol 90 Mcg/act Aers (Albuterol) ..... Use 2 puffs every 4-6 hours as needed wheezing and cough    Advair Diskus 100-50 Mcg/dose Misc (Fluticasone-salmeterol) .Marland Kitchen... Take 1 inhalation two times a day  Complete Medication List: 1)  Viagra 100 Mg Tabs (Sildenafil citrate) .Marland Kitchen.. 1 by mouth 1 hour prior to sex 2)  Caduet 10-40 Mg Tabs (Amlodipine-atorvastatin) .Marland Kitchen.. 1 by mouth q am 3)  Hyzaar 100-25 Mg Tabs (Losartan potassium-hctz) .Marland Kitchen.. 1 by mouth q am 4)  Niaspan 500 Mg Tbcr (Niacin (antihyperlipidemic)) .Marland Kitchen.. 1 by mouth once  daily 5)  Toprol Xl 100 Mg Tb24 (Metoprolol succinate) .... Take 1 tablet by mouth q 12hrs 6)  Aspir-low 81 Mg Tbec (Aspirin) .Marland Kitchen.. 1 by mouth once daily 7)  Benazepril Hcl 20 Mg Tabs (Benazepril hcl) .Marland Kitchen.. 1 by mouth once daily 8)  Albuterol 90 Mcg/act Aers (Albuterol) .... Use 2 puffs every 4-6 hours as needed wheezing and cough 9)  Advair Diskus 100-50 Mcg/dose Misc (Fluticasone-salmeterol) .... Take 1 inhalation two times a day 10)  Naprosyn 375 Mg Tabs (Naproxen) .Marland Kitchen.. 1 by mouth two times a day for 1 week; then as needed. 11)  Chantix Starting Month Pak 0.5 Mg X 11 & 1 Mg X 42 Tabs (Varenicline tartrate) .... As directed 12)  Chantix Continuing Month Pak 1 Mg Tabs (Varenicline tartrate) .... As directed 13)  Proctosol Hc 2.5 % Crea (Hydrocortisone) .... Apply two times a day to three times a day as needed  Hypertension Assessment/Plan:      The patient's hypertensive risk group is category C: Target organ damage and/or diabetes.  Today's blood pressure is 132/85.  His blood pressure goal is < 140/90.  Patient Instructions: 1)  Schedule [facility] at [facility]. 2)  Get Metamucil and drink one glass daily. 3)  Flu shot today. 4)  Please schedule a follow-up appointment in 1 month with Scott for CPE.  Prescriptions: ADVAIR DISKUS 100-50 MCG/DOSE MISC (FLUTICASONE-SALMETEROL) Take 1 inhalation two times a day  #1 x 5   Entered and Authorized by:   [name] PA-C   Signed by:   [name] PA-C on 09/23/2009   Method used:   Print then Give to Patient   RxID:   [id number] ALBUTEROL 90 MCG/ACT AERS (ALBUTEROL) Use 2 puffs every 4-6 hours as needed wheezing and cough  #1 x 5   Entered and Authorized by:   [name] PA-C   Signed by:   [name] PA-C on 09/23/2009   Method used:   Print then Give to Patient   RxID:   [id number] VIAGRA 100 MG TABS (  SILDENAFIL CITRATE) 1 by mouth 1 hour prior to sex  #20 x 0   Entered and Authorized by:   [name] PA-C    Signed by:   [name] PA-C on 09/23/2009   Method used:   Print then Give to Patient   RxID:   [id number] PROCTOSOL HC 2.5 % CREA (HYDROCORTISONE) apply two times a day to three times a day as needed  #1 tube x 6   Entered and Authorized by:   [name] PA-C   Signed by:   [name] PA-C on 09/23/2009   Method used:   Print then Give to Patient   RxID:   [id number]

## 2011-01-05 NOTE — Miscellaneous (Signed)
Summary: Review with patient at Appt 02/16/2010   See echo. Need to start on hydralazine if indicated.  Clinical Lists Changes

## 2011-01-05 NOTE — Letter (Signed)
Summary: CARDIAC  CARDIAC   Imported By: Roland Earl 10/30/2007 09:38:05  _____________________________________________________________________  External Attachment:    Type:   Image     Comment:   External Document

## 2011-01-05 NOTE — Progress Notes (Signed)
Summary: medication request  Phone Note Call from Patient   Summary of Call: pt was in today with a visit with Susie and wanted to leave provider a message regarding that he has been on a diet and eating correctly. he states has been walking and has lost 4 pounds. Pt wants to stress that he would really like to get back on his viagra. he states that he follows up with you on 4/14 but would like to speak with you prior to that appt if possible. I advised I would forward the message and provider will review. Pt states current weight as of today is 252.8. Initial call taken by: Vinetta Bergamo CMA,  February 24, 2010 10:40 AM  Follow-up for Phone Call        Good job on the exercise and weight loss.  His bp has been out of control and we really need to get a handle on that before he tries to take viagra.  Keep up the good work and take the meds as prescribed and quit smoking and I'll see what his BP is in April.  Also, make him aware that our pharmacy will not carry viagra in the near future. Follow-up by: [name] PA-C,  February 24, 2010 11:54 AM  Additional Follow-up for Phone Call Additional follow up Details #1::        left message to return call............Marland KitchenVinetta Bergamo CMA  March 03, 2010 11:15 AM     Additional Follow-up for Phone Call Additional follow up Details #2::    pt advised of information per Nicki Reaper regarding viagra and he is ok with waiting until being seen in April for re-evaluation. Pt is also aware that he will no longer be able to get his viagra filled at [facility] he must purchase at an outside pharmacy. Follow-up by: Vinetta Bergamo CMA,  March 03, 2010 11:55 AM

## 2011-01-05 NOTE — Letter (Signed)
Summary: REFERRAL/NUTRITION /APPT DATE & TIME  REFERRAL/NUTRITION /APPT DATE & TIME   Imported By: Roland Earl 04/15/2010 14:50:10  _____________________________________________________________________  External Attachment:    Type:   Image     Comment:   External Document

## 2011-01-05 NOTE — Letter (Signed)
Summary: INFLUENZA VACCINATION  INFLUENZA VACCINATION   Imported By: Roland Earl 11/09/2007 10:54:36  _____________________________________________________________________  External Attachment:    Type:   Image     Comment:   External Document

## 2011-01-05 NOTE — Letter (Signed)
Summary: Handout Printed  Printed Handout:  - Diet - Sodium-Controlled

## 2011-01-05 NOTE — Letter (Signed)
Summary: DENTAL REFERRAL  DENTAL REFERRAL   Imported By: Roland Earl 11/08/2007 10:36:12  _____________________________________________________________________  External Attachment:    Type:   Image     Comment:   External Document

## 2011-01-05 NOTE — Assessment & Plan Note (Signed)
Summary: TINGING IN LEFT ARM& TALK TO DR. PERRSONAL ISSUES//SS   Vital Signs:  Patient profile:   50 year old male Height:      66.5 inches Weight:      251 pounds BMI:     40.05 O2 Sat:      98 % Temp:     97.9 degrees F oral Pulse rate:   87 / minute Pulse rhythm:   regular Resp:     18 per minute BP sitting:   163 / 78  (left arm) Cuff size:   large  Vitals Entered By: [place] Shannon (January 16, 2010 12:30 PM) CC: pt is here because left arm tingling for a month now..... pt says the arm can not bear weight.... pt would like to get circumsed...., Hypertension Management Is Patient Diabetic? No Pain Assessment Patient in pain? no       Does patient need assistance? Functional Status Self care Ambulation Normal   CC:  pt is here because left arm tingling for a month now..... pt says the arm can not bear weight.... pt would like to get circumsed.... and Hypertension Management.  History of Present Illness: Here for tingling in left arm x 1 month. I was supposed to see him in November for a CPE.  Never came in.  He does not follow up like he should. He is still smoking.  His bp is out of control.  He ran out of meds when his eligibility ran out.  Has been back on meds for about 3 weeks now.   He has noticed left arm tingling and pain with certain positions.  Changing positions helps.  Symptoms last less than a minute.  No injury.  No neck pain.  Symptoms in distribution of C6 dermatome.  He denies weakness.  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 He also wants to be circumcised.  He states he has occ pain and swelling of the foreskin.  No signs of infection or inability to retract foreskin.   Hypertension History:      He denies chest pain, orthopnea, peripheral edema, and syncope.        Positive major cardiovascular risk factors include male age 78 years old or older, hyperlipidemia, hypertension, and current tobacco user.  Negative major cardiovascular risk factors  include negative family history for ischemic heart disease.        Positive history for target organ damage include ASHD (either angina/prior MI/prior CABG) and cardiac end organ damage (either CHF or LVH).     Current Medications (verified): 1)  Viagra 100 Mg Tabs (Sildenafil Citrate) .Marland Kitchen.. 1 By Mouth 1 Hour Prior To Sex 2)  Caduet 10-40 Mg Tabs (Amlodipine-Atorvastatin) .Marland Kitchen.. 1 By Mouth Q Am 3)  Hyzaar 100-25 Mg Tabs (Losartan Potassium-Hctz) .Marland Kitchen.. 1 By Mouth Q Am 4)  Niaspan 500 Mg Tbcr (Niacin (Antihyperlipidemic)) .Marland Kitchen.. 1 By Mouth Once Daily 5)  Toprol Xl 100 Mg Tb24 (Metoprolol Succinate) .... Take 1 Tablet By Mouth Q 12hrs 6)  Aspir-Low 81 Mg Tbec (Aspirin) .Marland Kitchen.. 1 By Mouth Once Daily 7)  Benazepril Hcl 20 Mg  Tabs (Benazepril Hcl) .Marland Kitchen.. 1 By Mouth Once Daily 8)  Albuterol 90 Mcg/act Aers (Albuterol) .... Use 2 Puffs Every 4-6 Hours As Needed Wheezing and Cough 9)  Advair Diskus 100-50 Mcg/dose Misc (Fluticasone-Salmeterol) .... Take 1 Inhalation Two Times A Day 10)  Naprosyn 375 Mg Tabs (Naproxen) .Marland Kitchen.. 1 By Mouth Two Times A Day For 1 Week; Then As Needed. 11)  Chantix Starting Month Pak 0.5 Mg X 11 & 1 Mg X 42 Tabs (Varenicline Tartrate) .... As Directed 12)  Chantix Continuing Month Pak 1 Mg Tabs (Varenicline Tartrate) .... As Directed 13)  Proctosol Hc 2.5 % Crea (Hydrocortisone) .... Apply Two Times A Day To Three Times A Day As Needed  Allergies (verified): 1)  ! Lisinopril 2)  ! Lidocaine Hcl (Lidocaine Hcl (Local Anesth.))  Physical Exam  General:  alert, well-developed, and well-nourished.   Head:  normocephalic and atraumatic.   Neck:  supple.   Lungs:  normal breath sounds, no crackles, and no wheezes.   Heart:  normal rate and regular rhythm.   Msk:  Left shoulder: full active and passive ROM no crepitus left trap muscle somewhat tight no biceps tendon tend with palp empty can test negative  Extremities:  no edema Neurologic:  alert & oriented X3 and cranial nerves  II-XII intact.   Psych:  normally interactive.     Impression & Recommendations:  Problem # 1:  SHOULDER STRAIN, LEFT (ICD-840.9)  likely related to over-activity with his job limited amounts of NSAID flexeril as needed check neck xray massage (trap muscles) continue ROM exercises f/u as needed  Orders: Diagnostic X-Ray/Fluoroscopy (Diagnostic X-Ray/Flu)  Problem # 2:  ESSENTIAL HYPERTENSION (ICD-401.9)  out of control usually BP is out of control patient still smoking already on ACE, ARB, beta blocker and CCB add clonidine 0.1 two times a day needs f/u on kidney fxn . . . creat was up at last check and he never came back for f/u  His updated medication list for this problem includes:    Caduet 10-40 Mg Tabs (Amlodipine-atorvastatin) .Marland Kitchen... 1 by mouth q am    Hyzaar 100-25 Mg Tabs (Losartan potassium-hctz) .Marland Kitchen... 1 by mouth q am    Toprol Xl 100 Mg Tb24 (Metoprolol succinate) .Marland Kitchen... Take 1 tablet by mouth q 12hrs    Benazepril Hcl 20 Mg Tabs (Benazepril hcl) .Marland Kitchen... 1 by mouth once daily    Clonidine Hcl 0.1 Mg Tabs (Clonidine hcl) .Marland Kitchen... Take 1 tablet by mouth two times a day for blood pressure  Orders: T-Comprehensive Metabolic Panel (A999333)  Problem # 3:  PENILE PAIN (ICD-607.89)  patient complains of occ pain related and irritation of foreskin would like to consider getting circumcised refer to urology  Orders: Urology Referral (Urology)  Problem # 4:  CHF (ICD-428.0) never had echo reorder to eval his LVF  His updated medication list for this problem includes:    Hyzaar 100-25 Mg Tabs (Losartan potassium-hctz) .Marland Kitchen... 1 by mouth q am    Toprol Xl 100 Mg Tb24 (Metoprolol succinate) .Marland Kitchen... Take 1 tablet by mouth q 12hrs    Aspir-low 81 Mg Tbec (Aspirin) .Marland Kitchen... 1 by mouth once daily    Benazepril Hcl 20 Mg Tabs (Benazepril hcl) .Marland Kitchen... 1 by mouth once daily  Complete Medication List: 1)  Viagra 100 Mg Tabs (Sildenafil citrate) .Marland Kitchen.. 1 by mouth 1 hour prior to  sex 2)  Caduet 10-40 Mg Tabs (Amlodipine-atorvastatin) .Marland Kitchen.. 1 by mouth q am 3)  Hyzaar 100-25 Mg Tabs (Losartan potassium-hctz) .Marland Kitchen.. 1 by mouth q am 4)  Niaspan 500 Mg Tbcr (Niacin (antihyperlipidemic)) .Marland Kitchen.. 1 by mouth once daily 5)  Toprol Xl 100 Mg Tb24 (Metoprolol succinate) .... Take 1 tablet by mouth q 12hrs 6)  Aspir-low 81 Mg Tbec (Aspirin) .Marland Kitchen.. 1 by mouth once daily 7)  Benazepril Hcl 20 Mg Tabs (Benazepril hcl) .Marland Kitchen.. 1 by mouth once daily 8)  Albuterol 90 Mcg/act Aers (Albuterol) .... Use 2 puffs every 4-6 hours as needed wheezing and cough 9)  Advair Diskus 100-50 Mcg/dose Misc (Fluticasone-salmeterol) .... Take 1 inhalation two times a day 10)  Naprosyn 375 Mg Tabs (Naproxen) .Marland Kitchen.. 1 by mouth two times a day for 3 days, then as needed 11)  Chantix Starting Month Pak 0.5 Mg X 11 & 1 Mg X 42 Tabs (Varenicline tartrate) .... As directed 12)  Chantix Continuing Month Pak 1 Mg Tabs (Varenicline tartrate) .... As directed 13)  Proctosol Hc 2.5 % Crea (Hydrocortisone) .... Apply two times a day to three times a day as needed 14)  Clonidine Hcl 0.1 Mg Tabs (Clonidine hcl) .... Take 1 tablet by mouth two times a day for blood pressure 15)  Flexeril 10 Mg Tabs (Cyclobenzaprine hcl) .Marland Kitchen.. 1 by mouth once daily to two times a day as needed for muscle spasm  Hypertension Assessment/Plan:      The patient's hypertensive risk group is category C: Target organ damage and/or diabetes.  Today's blood pressure is 163/78.  His blood pressure goal is < 140/90.  Patient Instructions: 1)  Please schedule a follow-up appointment in 1 month with Caroline Longie for blood pressure. 2)  Continue to put heat on your left shoulder once daily to two times a day. 3)  Try to use massage for your shoulder as well. 4)  Continue to do range of motion (lift arm above head, swing out and in, front and back) at least once a day for some "rehab." Prescriptions: FLEXERIL 10 MG TABS (CYCLOBENZAPRINE HCL) 1 by mouth once daily to two  times a day as needed for muscle spasm  #20 x 0   Entered and Authorized by:   [name] PA-C   Signed by:   [name] PA-C on 01/16/2010   Method used:   Print then Give to Patient   RxID:   [id number] NAPROSYN 375 MG TABS (NAPROXEN) 1 by mouth two times a day for 3 days, then as needed  #30 x 0   Entered and Authorized by:   [name] PA-C   Signed by:   [name] PA-C on 01/16/2010   Method used:   Print then Give to Patient   RxID:   [id number] CLONIDINE HCL 0.1 MG TABS (CLONIDINE HCL) Take 1 tablet by mouth two times a day for blood pressure  #60 x 5   Entered and Authorized by:   [name] PA-C   Signed by:   [name] PA-C on 01/16/2010   Method used:   Print then Give to Patient   RxID:   [id number]

## 2011-01-05 NOTE — Letter (Signed)
Summary: COLONOSCOPY PROCEDURE RECORD  COLONOSCOPY PROCEDURE RECORD   Imported By: Roland Earl 09/26/2009 10:13:48  _____________________________________________________________________  External Attachment:    Type:   Image     Comment:   External Document

## 2011-01-05 NOTE — Assessment & Plan Note (Signed)
Summary: 1 MONTH FU FOR BP///KT   Vital Signs:  Patient profile:   50 year old male Weight:      246 pounds O2 Sat:      93 % Temp:     97.9 degrees F Pulse rate:   86 / minute Pulse rhythm:   regular Resp:     22 per minute BP sitting:   144 / 81  (left arm) Cuff size:   large  Vitals Entered By: Oswaldo Conroy, SMA  Serial Vital Signs/Assessments:  Time      Position  BP       Pulse  Resp  Temp     By 9:52 AM             120/88                         [name] PA-C 9:53 AM             120/82                         [name] PA-C  Comments: 9:52 AM left arm By: [name] PA-C  9:53 AM right arm By: [name] PA-C   CC: Pt. is for a f/u on BP.  Is Patient Diabetic? No Pain Assessment Patient in pain? no       Does patient need assistance? Functional Status Self care Ambulation Normal   Primary Care Provider:  [name] PA-C  CC:  Pt. is for a f/u on BP. Marland Kitchen  History of Present Illness: Here for f/u. He is very excited as he has lost 10 lbs.  He denies any chest pain, dyspnea, syncope, orthopnea, PND or edema.  He has been walking every day and watching his diet.  He is still smoking.  He would like to get his viagra refilled.  He has been referred to nephrology due to evidence of CKD on 24 hour urine and renal u/s.  He has been using his albuterol more since the pollen counts increased.  He has noted some wheezing.  Current Medications (verified): 1)  Viagra 100 Mg Tabs (Sildenafil Citrate) .Marland Kitchen.. 1 By Mouth 1 Hour Prior To Sex 2)  Caduet 10-40 Mg Tabs (Amlodipine-Atorvastatin) .Marland Kitchen.. 1 By Mouth Q Am 3)  Hyzaar 100-25 Mg Tabs (Losartan Potassium-Hctz) .Marland Kitchen.. 1 By Mouth Q Am 4)  Niaspan 500 Mg Tbcr (Niacin (Antihyperlipidemic)) .Marland Kitchen.. 1 By Mouth Once Daily 5)  Toprol Xl 100 Mg Tb24 (Metoprolol Succinate) .... Take 1 Tablet By Mouth Q 12hrs 6)  Aspir-Low 81 Mg Tbec (Aspirin) .Marland Kitchen.. 1 By Mouth Once Daily 7)  Benazepril Hcl 20 Mg  Tabs (Benazepril Hcl)  .Marland Kitchen.. 1 By Mouth Once Daily 8)  Albuterol 90 Mcg/act Aers (Albuterol) .... Use 2 Puffs Every 4-6 Hours As Needed Wheezing and Cough 9)  Advair Diskus 100-50 Mcg/dose Misc (Fluticasone-Salmeterol) .... Take 1 Inhalation Two Times A Day 10)  Naprosyn 375 Mg Tabs (Naproxen) .Marland Kitchen.. 1 By Mouth Two Times A Day For 3 Days, Then As Needed 11)  Chantix Starting Month Pak 0.5 Mg X 11 & 1 Mg X 42 Tabs (Varenicline Tartrate) .... As Directed 12)  Chantix Continuing Month Pak 1 Mg Tabs (Varenicline Tartrate) .... As Directed 13)  Proctosol Hc 2.5 % Crea (Hydrocortisone) .... Apply Two Times A Day To Three Times A Day As Needed 14)  Clonidine Hcl 0.1 Mg Tabs (Clonidine Hcl) .Marland KitchenMarland KitchenMarland Kitchen  Take 1 Tablet By Mouth Two Times A Day For Blood Pressure 15)  Flexeril 10 Mg Tabs (Cyclobenzaprine Hcl) .Marland Kitchen.. 1 By Mouth Once Daily To Two Times A Day As Needed For Muscle Spasm 16)  Hydralazine Hcl 25 Mg Tabs (Hydralazine Hcl) .... Take 1 Tablet By Mouth Three Times A Day  Allergies (verified): 1)  ! Lisinopril 2)  ! Lidocaine Hcl (Lidocaine Hcl (Local Anesth.))  Past History:  Past Medical History: Last updated: 02/10/2010 Current Problems:  CAD (ICD-414.00) sees Dr.Harwani   a.  NSTEMI 2005; tx at [facility] with PCI (? vessel)   b.  EF 40-45% at cath in 2005 at [facility] Ischemic Cardiomyopathy Chronic Systolic CHF (99991111)   a.  Echo 01/2010:  Mod LVH; EF 40-45%; basal-mid inf. AK; mod Inferolat. HK; Grade 1 Dias. Dysfxn; mild LAE HYPERLIPIDEMIA NEC/NOS (ICD-272.4) Hypertension ASTHMA (ICD-493.90) dx 2000 PROTEINURIA dx12/9/05 OBESITY (ICD-278.00)  Physical Exam  General:  alert, well-developed, and well-nourished.   Head:  normocephalic and atraumatic.   Neck:  supple.   Lungs:  normal breath sounds, no crackles, and no wheezes.   Heart:  normal rate and regular rhythm with occ premature beat (patient has a h/o PVCs) Abdomen:  soft.   Extremities:  no edema Neurologic:  alert & oriented X3 and cranial nerves II-XII  intact.   Psych:  normally interactive.     Impression & Recommendations:  Problem # 1:  ESSENTIAL HYPERTENSION (ICD-401.9)  much better controlled congratulated him on his weight loss needs to quit smoking and lose another 10 lbs may be able to stop clonidine at some point check bmet today  His updated medication list for this problem includes:    Caduet 10-40 Mg Tabs (Amlodipine-atorvastatin) .Marland Kitchen... 1 by mouth q am    Hyzaar 100-25 Mg Tabs (Losartan potassium-hctz) .Marland Kitchen... 1 by mouth q am    Toprol Xl 100 Mg Tb24 (Metoprolol succinate) .Marland Kitchen... Take 1 tablet by mouth q 12hrs    Benazepril Hcl 20 Mg Tabs (Benazepril hcl) .Marland Kitchen... 1 by mouth once daily    Clonidine Hcl 0.1 Mg Tabs (Clonidine hcl) .Marland Kitchen... Take 1 tablet by mouth two times a day for blood pressure    Hydralazine Hcl 25 Mg Tabs (Hydralazine hcl) .Marland Kitchen... Take 1 tablet by mouth three times a day  Orders: T-Basic Metabolic Panel (99991111)  Problem # 2:  RENAL INSUFFICIENCY (ICD-588.9)  check bmet has a referral pending to nephrology  Orders: T-Basic Metabolic Panel (99991111)  Problem # 3:  ASTHMA (ICD-493.90) really COPD will increase Advair to 250/50 for now  His updated medication list for this problem includes:    Albuterol 90 Mcg/act Aers (Albuterol) ..... Use 2 puffs every 4-6 hours as needed wheezing and cough    Advair Diskus 250-50 Mcg/dose Aepb (Fluticasone-salmeterol) .Marland Kitchen... 1 puff two times a day  Problem # 4:  ERECTILE DYSFUNCTION (ICD-302.72) refill viagra  His updated medication list for this problem includes:    Viagra 100 Mg Tabs (Sildenafil citrate) .Marland Kitchen... 1 by mouth 1 hour prior to sex  Complete Medication List: 1)  Viagra 100 Mg Tabs (Sildenafil citrate) .Marland Kitchen.. 1 by mouth 1 hour prior to sex 2)  Caduet 10-40 Mg Tabs (Amlodipine-atorvastatin) .Marland Kitchen.. 1 by mouth q am 3)  Hyzaar 100-25 Mg Tabs (Losartan potassium-hctz) .Marland Kitchen.. 1 by mouth q am 4)  Niaspan 500 Mg Tbcr (Niacin (antihyperlipidemic)) .Marland Kitchen.. 1 by  mouth once daily 5)  Toprol Xl 100 Mg Tb24 (Metoprolol succinate) .... Take 1 tablet by mouth q  12hrs 6)  Aspir-low 81 Mg Tbec (Aspirin) .Marland Kitchen.. 1 by mouth once daily 7)  Benazepril Hcl 20 Mg Tabs (Benazepril hcl) .Marland Kitchen.. 1 by mouth once daily 8)  Albuterol 90 Mcg/act Aers (Albuterol) .... Use 2 puffs every 4-6 hours as needed wheezing and cough 9)  Advair Diskus 250-50 Mcg/dose Aepb (Fluticasone-salmeterol) .Marland Kitchen.. 1 puff two times a day 10)  Naprosyn 375 Mg Tabs (Naproxen) .Marland Kitchen.. 1 by mouth two times a day for 3 days, then as needed 11)  Chantix Starting Month Pak 0.5 Mg X 11 & 1 Mg X 42 Tabs (Varenicline tartrate) .... As directed 12)  Chantix Continuing Month Pak 1 Mg Tabs (Varenicline tartrate) .... As directed 13)  Proctosol Hc 2.5 % Crea (Hydrocortisone) .... Apply two times a day to three times a day as needed 14)  Clonidine Hcl 0.1 Mg Tabs (Clonidine hcl) .... Take 1 tablet by mouth two times a day for blood pressure 15)  Flexeril 10 Mg Tabs (Cyclobenzaprine hcl) .Marland Kitchen.. 1 by mouth once daily to two times a day as needed for muscle spasm 16)  Hydralazine Hcl 25 Mg Tabs (Hydralazine hcl) .... Take 1 tablet by mouth three times a day  Patient Instructions: 1)  Please schedule a follow-up appointment in 3 months with Scott for blood pressure. 2)    Prescriptions: VIAGRA 100 MG TABS (SILDENAFIL CITRATE) 1 by mouth 1 hour prior to sex  #20 x 2   Entered and Authorized by:   [name] PA-C   Signed by:   [name] PA-C on 03/19/2010   Method used:   Faxed to ...       [facility] (retail)       [place], [place]  [address]       Ph: [phone] [place]       Fax: [phone]   RxID:   [id number] ADVAIR DISKUS 250-50 MCG/DOSE AEPB (FLUTICASONE-SALMETEROL) 1 puff two times a day  #1 x 5   Entered and Authorized by:   [name] PA-C   Signed by:   [name] PA-C on 03/19/2010   Method used:   Faxed to ...       [facility] (retail)       [place], [place]  [address]       Ph: [phone]       Fax: [phone]   RxID:   [id number]

## 2011-01-05 NOTE — Assessment & Plan Note (Signed)
Summary: PHYSICAL EXAM//GK   Vital Signs:  Patient Profile:   50 Years Old Male Height:     66.5 inches Weight:      252 pounds BMI:     40.21 BSA:     2.22 Temp:     98.5 degrees F oral Pulse rate:   80 / minute Pulse rhythm:   regular Resp:     24 per minute BP sitting:   152 / 100  (right arm) Cuff size:   regular  Pt. in pain?   no  Vitals Entered By: Bridgett Larsson RN (October 01, 2008 9:31 AM)              Is Patient Diabetic? No  Does patient need assistance? Functional Status Self care Ambulation Normal   Vision Comments: eyes examined in March or April given new glasses   Chief Complaint:  CPE; rectal bleeding 2-3 times per month; request refills for Advair and Albuterol.  History of Present Illness: Here for CPE. Has had bright red restal bleeding 2-3x each month x 3-4 months. Painless. Has no known h/o hemhorroids. No prior h/o flex sig or colonoscopy. Reports compliance with meds. Needs fasting labs and refills.  Still has issues with known ecrectile dysfxn and difficulties getting and maintaining an erection. He prefers cialis as works better for him than viagra but he is aware that only viagra is available through [employer]. Offered to write him a script for out-of-pocket payment for cialis but he declined secondary to cost issues.     Prior Medications Reviewed Using: Medication Bottles  Updated Prior Medication List: VIAGRA 100 MG TABS (SILDENAFIL CITRATE) 1 by mouth 1 hour prior to sex CADUET 10-40 MG TABS (AMLODIPINE-ATORVASTATIN) 1 by mouth q am HYZAAR 100-25 MG TABS (LOSARTAN POTASSIUM-HCTZ) 1 by mouth q am NIASPAN 500 MG TBCR (NIACIN (ANTIHYPERLIPIDEMIC)) 1 by mouth once daily TOPROL XL 100 MG TB24 (METOPROLOL SUCCINATE) Take 1 tablet by mouth q 12hrs ASPIR-LOW 81 MG TBEC (ASPIRIN) 1 by mouth once daily BENAZEPRIL HCL 20 MG  TABS (BENAZEPRIL HCL) 1 by mouth once daily ALBUTEROL 90 MCG/ACT AERS (ALBUTEROL) Use 2 puffs every 4-6 hours as  needed wheezing and cough ADVAIR DISKUS 100-50 MCG/DOSE MISC (FLUTICASONE-SALMETEROL) Take 1 inhalation two times a day  Current Allergies (reviewed today): ! LISINOPRIL ! LIDOCAINE HCL (LIDOCAINE HCL (LOCAL ANESTH.))  Past Medical History:    Reviewed history from 07/14/2007 and no changes required:       Current Problems:        OBESITY (ICD-278.00)       CHF (ICD-428.0)       HYPERLIPIDEMIA NEC/NOS (ICD-272.4)       CAD (ICD-414.00) sees Dr.Harwani       ASTHMA (ICD-493.90) dx 2000       PROTEINURIA dx12/9/05  Past Surgical History:    Reviewed history from 11/08/2007 and no changes required:       Denies surgical history   Family History:    Mother living HTN    Father died ETOH.    1 brother living no problems    1 sister unknown    2 sisters HTN.      No family h/o colon cancer or stomach cancer.  Social History:    Occupation:    Single    Current Smoker    Alcohol use-no    Drug use-yes...distant crack use in 2003. None since.   Risk Factors:  Tobacco use:  current    Counseled to  quit/cut down tobacco use:  yes Drug use:  yes Alcohol use:  no   Review of Systems  The patient denies anorexia and fever.         Occ gets SOB and has to use inhaler. Rare left-side chest discomfort< 20 minutes. No indigestion. Walks occasionally 30 minutes...no problems. Occasional headache.    Physical Exam  General:     Well-developed,well-nourished,in no acute distress; alert,appropriate and cooperative throughout examination Head:     Normocephalic and atraumatic without obvious abnormalities. No apparent alopecia or balding. Eyes:     No corneal or conjunctival inflammation noted. EOMI. Perrla. Vision grossly normal. Ears:     R ear normal and L ear with cerumen impaction.R ear normal and L ear normal.   Nose:     External nasal examination shows no deformity or inflammation. Nasal mucosa are pink and moist without lesions or exudates. Mouth:     Oral  mucosa and oropharynx without lesions or exudates.  Teeth in good repair.pharynx pink and moist, no erythema, and no exudates.   Neck:     supple, no masses, and no thyromegaly.   Lungs:     Normal respiratory effort, chest expands symmetrically. Lungs are clear to auscultation, no crackles or wheezes. Heart:     Normal rate and regular rhythm. S1 and S2 normal without gallop, murmur, click, rub or other extra sounds. Abdomen:     Bowel sounds positive,abdomen soft and non-tender without masses, organomegaly or hernias noted. Rectal:     . Normal sphincter tone. No rectal masses or tenderness. Has perianal hemhorroid tag. Stool guiac negative with positive control.  Genitalia:     Testes bilaterally descended without nodularity, tenderness or masses. No scrotal masses or lesions. No penis lesions or urethral discharge.uncircumcised.   Prostate:     Prostate gland firm and smooth, no enlargement, nodularity, tenderness, mass, asymmetry or induration. Msk:     No deformity or scoliosis noted of thoracic or lumbar spine.   Pulses:     R dorsalis pedis normal and L dorsalis pedis normal.   Extremities:     No CCE. Neurologic:     alert & oriented X3 and cranial nerves II-XII intact grossly. Skin:     Intact without suspicious lesions or rashes Cervical Nodes:     No lymphadenopathy noted Psych:     Cognition and judgment appear intact. Alert and cooperative with normal attention span and concentration. No apparent delusions, illusions, hallucinations    Impression & Recommendations:  Problem # 1:  EXAMINATION, ROUTINE MEDICAL (ICD-V70.0)  Orders: UA Dipstick w/o Micro (automated)  [id number]) T-Comprehensive Metabolic Panel ([id number]) T-Lipid Profile [id number]) T-CBC w/Diff [id number]) T-PSA  [id number]) Hemoccult Guaiac-1 spec.(in office) [id number])   Problem # 2:  RECTAL BLEEDING (ICD-569.3)  Orders: Gastroenterology Referral (GI)   Problem # 3:  ASTHMA  (ICD-493.90) Pt had FLU shot through work. His updated medication list for this problem includes:    Albuterol 90 Mcg/act Aers (Albuterol) ..... Use 2 puffs every 4-6 hours as needed wheezing and cough    Advair Diskus 100-50 Mcg/dose Misc (Fluticasone-salmeterol) .Marland Kitchen... Take 1 inhalation two times a day   Problem # 4:  HYPERLIPIDEMIA NEC/NOS (ICD-272.4)  His updated medication list for this problem includes:    Caduet 10-40 Mg Tabs (Amlodipine-atorvastatin) .Marland Kitchen... 1 by mouth q am    Niaspan 500 Mg Tbcr (Niacin (antihyperlipidemic)) .Marland Kitchen... 1 by mouth once daily   Problem # 5:  ESSENTIAL HYPERTENSION (ICD-401.9)  His updated medication  list for this problem includes:    Caduet 10-40 Mg Tabs (Amlodipine-atorvastatin) .Marland Kitchen... 1 by mouth q am    Hyzaar 100-25 Mg Tabs (Losartan potassium-hctz) .Marland Kitchen... 1 by mouth q am    Toprol Xl 100 Mg Tb24 (Metoprolol succinate) .Marland Kitchen... Take 1 tablet by mouth q 12hrs    Benazepril Hcl 20 Mg Tabs (Benazepril hcl) .Marland Kitchen... 1 by mouth once daily   Complete Medication List: 1)  Viagra 100 Mg Tabs (Sildenafil citrate) .Marland Kitchen.. 1 by mouth 1 hour prior to sex 2)  Caduet 10-40 Mg Tabs (Amlodipine-atorvastatin) .Marland Kitchen.. 1 by mouth q am 3)  Hyzaar 100-25 Mg Tabs (Losartan potassium-hctz) .Marland Kitchen.. 1 by mouth q am 4)  Niaspan 500 Mg Tbcr (Niacin (antihyperlipidemic)) .Marland Kitchen.. 1 by mouth once daily 5)  Toprol Xl 100 Mg Tb24 (Metoprolol succinate) .... Take 1 tablet by mouth q 12hrs 6)  Aspir-low 81 Mg Tbec (Aspirin) .Marland Kitchen.. 1 by mouth once daily 7)  Benazepril Hcl 20 Mg Tabs (Benazepril hcl) .Marland Kitchen.. 1 by mouth once daily 8)  Albuterol 90 Mcg/act Aers (Albuterol) .... Use 2 puffs every 4-6 hours as needed wheezing and cough 9)  Advair Diskus 100-50 Mcg/dose Misc (Fluticasone-salmeterol) .... Take 1 inhalation two times a day   Patient Instructions: 1)  Please schedule a follow-up appointment in 6 months for fasting labs. 2)  Keep appt with Dr.Pawhuska for your  colonoscopy.   Prescriptions: ADVAIR DISKUS 100-50 MCG/DOSE MISC (FLUTICASONE-SALMETEROL) Take 1 inhalation two times a day  #1 x 4   Entered and Authorized by:   Milagros Evener MD   Signed by:   Milagros Evener MD on 10/01/2008   Method used:   Print then Give to Patient   RxID:   [id number] ALBUTEROL 90 MCG/ACT AERS (ALBUTEROL) Use 2 puffs every 4-6 hours as needed wheezing and cough  #1 x 4   Entered and Authorized by:   Milagros Evener MD   Signed by:   Milagros Evener MD on 10/01/2008   Method used:   Print then Give to Patient   RxID:   [id number] BENAZEPRIL HCL 20 MG  TABS (BENAZEPRIL HCL) 1 by mouth once daily  #30 x 5   Entered and Authorized by:   Milagros Evener MD   Signed by:   Milagros Evener MD on 10/01/2008   Method used:   Print then Give to Patient   RxID:   [id number] ASPIR-LOW 51 MG TBEC (ASPIRIN) 1 by mouth once daily  #30 x 5   Entered and Authorized by:   Milagros Evener MD   Signed by:   Milagros Evener MD on 10/01/2008   Method used:   Print then Give to Patient   RxID:   [id number] TOPROL XL 100 MG TB24 (METOPROLOL SUCCINATE) Take 1 tablet by mouth q 12hrs  #30 x 5   Entered and Authorized by:   Milagros Evener MD   Signed by:   Milagros Evener MD on 10/01/2008   Method used:   Print then Give to Patient   RxID:   [id number] NIASPAN 500 MG TBCR (NIACIN (ANTIHYPERLIPIDEMIC)) 1 by mouth once daily  #30 x 5   Entered and Authorized by:   Milagros Evener MD   Signed by:   Milagros Evener MD on 10/01/2008   Method used:   Print then Give to Patient   RxID:   [id number] HYZAAR 100-25 MG TABS (LOSARTAN POTASSIUM-HCTZ) 1 by mouth q am  #30 x 5   Entered and Authorized by:   [place]  Rankins MD   Signed by:   Milagros Evener MD on 10/01/2008   Method used:   Print then Give to Patient   RxID:   [id number] CADUET 10-40 MG TABS (AMLODIPINE-ATORVASTATIN) 1 by mouth q am  #30 x 5   Entered and Authorized by:    Milagros Evener MD   Signed by:   Milagros Evener MD on 10/01/2008   Method used:   Print then Give to Patient   RxID:   [id number] VIAGRA 100 MG TABS (SILDENAFIL CITRATE) 1 by mouth 1 hour prior to sex  #10 x 3   Entered and Authorized by:   Milagros Evener MD   Signed by:   Milagros Evener MD on 10/01/2008   Method used:   Print then Give to Patient   RxID:   [id number]   ]  Influenza Immunization History:    Influenza # 1:  Fluvax 3+ (09/10/2008)  Laboratory Results   Urine Tests  Date/Time Received: October 01, 2008 10:50 AM  Date/Time Reported: October 01, 2008 10:50 AM   Routine Urinalysis   Color: lt. yellow Glucose: negative   (Normal Range: Negative) Bilirubin: negative   (Normal Range: Negative) Ketone: negative   (Normal Range: Negative) Spec. Gravity: 1.020   (Normal Range: 1.003-1.035) Blood: negative   (Normal Range: Negative) pH: 6.5   (Normal Range: 5.0-8.0) Protein: 100   (Normal Range: Negative) Urobilinogen: 0.2   (Normal Range: 0-1) Nitrite: negative   (Normal Range: Negative) Leukocyte Esterace: negative   (Normal Range: Negative)

## 2011-01-05 NOTE — Progress Notes (Signed)
  Request recieved from [place] [employer] sent to [employer]. Joelene Millin Mesiemore  July 13, 2010 1:39 PM

## 2011-01-05 NOTE — Progress Notes (Signed)
Summary: HARD TO BREATH   Phone Note Call from Patient   Summary of Call: [facility] PT. CALLED AND SAYING THAT HIS ASTHMA IS BOTHERING HIM AND IS HAVING A HARD TIME BREATHING. Initial call taken by: Roberto Scales,  October 24, 2007 3:50 PM  Follow-up for Phone Call        Left message to call Follow-up by: [facility] November 18,2008  Additional Follow-up for Phone Call Additional follow up Details #1::        Pt states has asthma and was never given any medications. Pt states has been wheezing and change in breathing patterns. Pt advised to come in am to be worked in by the nurse. Additional Follow-up by: Vinetta Bergamo CMA,  October 24, 2007 4:52 PM

## 2011-01-05 NOTE — Miscellaneous (Signed)
Summary: ? [facility] Allergy  Clinical Lists Changes  Problems: Added new problem of ANGIOEDEMA (ICD-995.1) Assessed ANGIOEDEMA as comment only - seen in ED 5.20.2011 Benazepril stopped        Impression & Recommendations:  Problem # 1:  ANGIOEDEMA (ICD-995.1) seen in ED 5.20.2011 Benazepril stopped  Complete Medication List: 1)  Viagra 100 Mg Tabs (Sildenafil citrate) .Marland Kitchen.. 1 by mouth 1 hour prior to sex 2)  Caduet 10-40 Mg Tabs (Amlodipine-atorvastatin) .Marland Kitchen.. 1 by mouth q am 3)  Hyzaar 100-25 Mg Tabs (Losartan potassium-hctz) .Marland Kitchen.. 1 by mouth q am 4)  Niaspan 500 Mg Tbcr (Niacin (antihyperlipidemic)) .Marland Kitchen.. 1 by mouth once daily 5)  Toprol Xl 100 Mg Tb24 (Metoprolol succinate) .... Take 1 tablet by mouth q 12hrs 6)  Aspir-low 81 Mg Tbec (Aspirin) .Marland Kitchen.. 1 by mouth once daily 7)  Benazepril Hcl 20 Mg Tabs (Benazepril hcl) .Marland Kitchen.. 1 by mouth once daily 8)  Albuterol 90 Mcg/act Aers (Albuterol) .... Use 2 puffs every 4-6 hours as needed wheezing and cough 9)  Advair Diskus 250-50 Mcg/dose Aepb (Fluticasone-salmeterol) .Marland Kitchen.. 1 puff two times a day 10)  Naprosyn 375 Mg Tabs (Naproxen) .Marland Kitchen.. 1 by mouth two times a day for 3 days, then as needed 11)  Chantix Starting Month Pak 0.5 Mg X 11 & 1 Mg X 42 Tabs (Varenicline tartrate) .... As directed 12)  Chantix Continuing Month Pak 1 Mg Tabs (Varenicline tartrate) .... As directed 13)  Proctosol Hc 2.5 % Crea (Hydrocortisone) .... Apply two times a day to three times a day as needed 14)  Clonidine Hcl 0.1 Mg Tabs (Clonidine hcl) .... Take 1 tablet by mouth two times a day for blood pressure 15)  Flexeril 10 Mg Tabs (Cyclobenzaprine hcl) .Marland Kitchen.. 1 by mouth once daily to two times a day as needed for muscle spasm 16)  Hydralazine Hcl 25 Mg Tabs (Hydralazine hcl) .... Take 1 tablet by mouth three times a day   Allergies: 1)  ! Lisinopril 2)  ! Lidocaine Hcl (Lidocaine Hcl (Local Anesth.))

## 2011-01-05 NOTE — Assessment & Plan Note (Signed)
Summary: one month f/u htn /tmm   Vital Signs:  Patient profile:   50 year old male Height:      66.5 inches Weight:      257 pounds BMI:     41.01 Temp:     97.7 degrees F oral Pulse rate:   80 / minute Pulse rhythm:   regular Resp:     18 per minute BP sitting:   160 / 110  (left arm) Cuff size:   large  Vitals Entered By: [place] Shannon (February 16, 2010 10:01 AM) CC: one month f/u.... pt has took med this morning around 9....., Hypertension Management Is Patient Diabetic? No Pain Assessment Patient in pain? no       Does patient need assistance? Functional Status Self care Ambulation Normal   Primary Care Provider:  [name] PA-C  CC:  one month f/u.... pt has took med this morning around 9..... and Hypertension Management.  History of Present Illness: Here for f/u. Taking bp meds. . . missed just one day. Does admit to poor diet.  Eats at [employer] daily.   Had echo recently.  Has ICM with EF of 40-45%.   Not willing to go on nitrates. Started clonidine last time and admits to taking. No chest pain, pedal edema, PND.  Sleeps on 2 pillows.  NYHA Class 1-2.   Has sleep apnea.  Not using CPAP nightly. Just quit smoking.  Has not had cig in 2 days.  Has a 25 pack year hx.   Hypertension History:      He denies headache, chest pain, dyspnea with exertion, neurologic problems, and syncope.  He notes no problems with any antihypertensive medication side effects.        Positive major cardiovascular risk factors include male age 59 years old or older, hyperlipidemia, hypertension, and current tobacco user.  Negative major cardiovascular risk factors include negative family history for ischemic heart disease.        Positive history for target organ damage include ASHD (either angina/prior MI/prior CABG) and cardiac end organ damage (either CHF or LVH).     Current Medications (verified): 1)  Viagra 100 Mg Tabs (Sildenafil Citrate) .Marland Kitchen.. 1 By Mouth 1 Hour  Prior To Sex 2)  Caduet 10-40 Mg Tabs (Amlodipine-Atorvastatin) .Marland Kitchen.. 1 By Mouth Q Am 3)  Hyzaar 100-25 Mg Tabs (Losartan Potassium-Hctz) .Marland Kitchen.. 1 By Mouth Q Am 4)  Niaspan 500 Mg Tbcr (Niacin (Antihyperlipidemic)) .Marland Kitchen.. 1 By Mouth Once Daily 5)  Toprol Xl 100 Mg Tb24 (Metoprolol Succinate) .... Take 1 Tablet By Mouth Q 12hrs 6)  Aspir-Low 81 Mg Tbec (Aspirin) .Marland Kitchen.. 1 By Mouth Once Daily 7)  Benazepril Hcl 20 Mg  Tabs (Benazepril Hcl) .Marland Kitchen.. 1 By Mouth Once Daily 8)  Albuterol 90 Mcg/act Aers (Albuterol) .... Use 2 Puffs Every 4-6 Hours As Needed Wheezing and Cough 9)  Advair Diskus 100-50 Mcg/dose Misc (Fluticasone-Salmeterol) .... Take 1 Inhalation Two Times A Day 10)  Naprosyn 375 Mg Tabs (Naproxen) .Marland Kitchen.. 1 By Mouth Two Times A Day For 3 Days, Then As Needed 11)  Chantix Starting Month Pak 0.5 Mg X 11 & 1 Mg X 42 Tabs (Varenicline Tartrate) .... As Directed 12)  Chantix Continuing Month Pak 1 Mg Tabs (Varenicline Tartrate) .... As Directed 13)  Proctosol Hc 2.5 % Crea (Hydrocortisone) .... Apply Two Times A Day To Three Times A Day As Needed 14)  Clonidine Hcl 0.1 Mg Tabs (Clonidine Hcl) .... Take 1 Tablet By  Mouth Two Times A Day For Blood Pressure 15)  Flexeril 10 Mg Tabs (Cyclobenzaprine Hcl) .Marland Kitchen.. 1 By Mouth Once Daily To Two Times A Day As Needed For Muscle Spasm  Allergies (verified): 1)  ! Lisinopril 2)  ! Lidocaine Hcl (Lidocaine Hcl (Local Anesth.))  Past History:  Past Medical History: Reviewed history from 02/10/2010 and no changes required. Current Problems:  CAD (ICD-414.00) sees Dr.Harwani   a.  NSTEMI 2005; tx at [facility] with PCI (? vessel)   b.  EF 40-45% at cath in 2005 at [facility] Ischemic Cardiomyopathy Chronic Systolic CHF (99991111)   a.  Echo 01/2010:  Mod LVH; EF 40-45%; basal-mid inf. AK; mod Inferolat. HK; Grade 1 Dias. Dysfxn; mild LAE HYPERLIPIDEMIA NEC/NOS (ICD-272.4) Hypertension ASTHMA (ICD-493.90) dx 2000 PROTEINURIA dx12/9/05 OBESITY (ICD-278.00)  Physical  Exam  General:  alert, well-developed, and well-nourished.   Head:  normocephalic and atraumatic.   Neck:  supple and no JVD.   Lungs:  normal breath sounds, no crackles, and no wheezes.   Heart:  normal rate and regular rhythm.   Abdomen:  soft, non-tender, normal bowel sounds, and no hepatomegaly.   Extremities:  no edema  Neurologic:  alert & oriented X3 and cranial nerves II-XII intact.   Psych:  normally interactive.     Impression & Recommendations:  Problem # 1:  ESSENTIAL HYPERTENSION (ICD-401.9)  uncontrolled now on clonidine with reduced EF, suggest he be on hydralazine and nitrates he refuses to take nitrates will start hydralazine f/u with me in one month work on diet and exercise refer to nutritionist  His updated medication list for this problem includes:    Caduet 10-40 Mg Tabs (Amlodipine-atorvastatin) .Marland Kitchen... 1 by mouth q am    Hyzaar 100-25 Mg Tabs (Losartan potassium-hctz) .Marland Kitchen... 1 by mouth q am    Toprol Xl 100 Mg Tb24 (Metoprolol succinate) .Marland Kitchen... Take 1 tablet by mouth q 12hrs    Benazepril Hcl 20 Mg Tabs (Benazepril hcl) .Marland Kitchen... 1 by mouth once daily    Clonidine Hcl 0.1 Mg Tabs (Clonidine hcl) .Marland Kitchen... Take 1 tablet by mouth two times a day for blood pressure    Hydralazine Hcl 25 Mg Tabs (Hydralazine hcl) .Marland Kitchen... Take 1 tablet by mouth three times a day  Orders: T-Urinalysis [id number]) Nutrition Referral (Nutrition)  Problem # 2:  CAD (ICD-414.00) stable no chest pain cont ASA consider referral to cards over time for routine f/u  His updated medication list for this problem includes:    Caduet 10-40 Mg Tabs (Amlodipine-atorvastatin) .Marland Kitchen... 1 by mouth q am    Hyzaar 100-25 Mg Tabs (Losartan potassium-hctz) .Marland Kitchen... 1 by mouth q am    Toprol Xl 100 Mg Tb24 (Metoprolol succinate) .Marland Kitchen... Take 1 tablet by mouth q 12hrs    Aspir-low 81 Mg Tbec (Aspirin) .Marland Kitchen... 1 by mouth once daily    Benazepril Hcl 20 Mg Tabs (Benazepril hcl) .Marland Kitchen... 1 by mouth once daily     Clonidine Hcl 0.1 Mg Tabs (Clonidine hcl) .Marland Kitchen... Take 1 tablet by mouth two times a day for blood pressure    Hydralazine Hcl 25 Mg Tabs (Hydralazine hcl) .Marland Kitchen... Take 1 tablet by mouth three times a day  Problem # 3:  SLEEP APNEA (ICD-780.57) noncompliance likely contributing to increased bp rec he get back on CPAP nightly  Problem # 4:  ISCHEMIC CARDIOMYOPATHY (ICD-414.8) on Toprol 200 mg a day . . . no need to change to carvedilol on ARB and ACE refuses nitrates add hydralazine cont ASA stable  symptoms with NYHA Class 1-2 symptoms  His updated medication list for this problem includes:    Caduet 10-40 Mg Tabs (Amlodipine-atorvastatin) .Marland Kitchen... 1 by mouth q am    Hyzaar 100-25 Mg Tabs (Losartan potassium-hctz) .Marland Kitchen... 1 by mouth q am    Toprol Xl 100 Mg Tb24 (Metoprolol succinate) .Marland Kitchen... Take 1 tablet by mouth q 12hrs    Aspir-low 81 Mg Tbec (Aspirin) .Marland Kitchen... 1 by mouth once daily    Benazepril Hcl 20 Mg Tabs (Benazepril hcl) .Marland Kitchen... 1 by mouth once daily    Clonidine Hcl 0.1 Mg Tabs (Clonidine hcl) .Marland Kitchen... Take 1 tablet by mouth two times a day for blood pressure    Hydralazine Hcl 25 Mg Tabs (Hydralazine hcl) .Marland Kitchen... Take 1 tablet by mouth three times a day  Problem # 5:  OBESITY (ICD-278.00)  rec weight loss ref to dietician  Orders: Nutrition Referral (Nutrition)  Complete Medication List: 1)  Viagra 100 Mg Tabs (Sildenafil citrate) .Marland Kitchen.. 1 by mouth 1 hour prior to sex 2)  Caduet 10-40 Mg Tabs (Amlodipine-atorvastatin) .Marland Kitchen.. 1 by mouth q am 3)  Hyzaar 100-25 Mg Tabs (Losartan potassium-hctz) .Marland Kitchen.. 1 by mouth q am 4)  Niaspan 500 Mg Tbcr (Niacin (antihyperlipidemic)) .Marland Kitchen.. 1 by mouth once daily 5)  Toprol Xl 100 Mg Tb24 (Metoprolol succinate) .... Take 1 tablet by mouth q 12hrs 6)  Aspir-low 81 Mg Tbec (Aspirin) .Marland Kitchen.. 1 by mouth once daily 7)  Benazepril Hcl 20 Mg Tabs (Benazepril hcl) .Marland Kitchen.. 1 by mouth once daily 8)  Albuterol 90 Mcg/act Aers (Albuterol) .... Use 2 puffs every 4-6 hours as  needed wheezing and cough 9)  Advair Diskus 100-50 Mcg/dose Misc (Fluticasone-salmeterol) .... Take 1 inhalation two times a day 10)  Naprosyn 375 Mg Tabs (Naproxen) .Marland Kitchen.. 1 by mouth two times a day for 3 days, then as needed 11)  Chantix Starting Month Pak 0.5 Mg X 11 & 1 Mg X 42 Tabs (Varenicline tartrate) .... As directed 12)  Chantix Continuing Month Pak 1 Mg Tabs (Varenicline tartrate) .... As directed 13)  Proctosol Hc 2.5 % Crea (Hydrocortisone) .... Apply two times a day to three times a day as needed 14)  Clonidine Hcl 0.1 Mg Tabs (Clonidine hcl) .... Take 1 tablet by mouth two times a day for blood pressure 15)  Flexeril 10 Mg Tabs (Cyclobenzaprine hcl) .Marland Kitchen.. 1 by mouth once daily to two times a day as needed for muscle spasm 16)  Hydralazine Hcl 25 Mg Tabs (Hydralazine hcl) .... Take 1 tablet by mouth three times a day  Hypertension Assessment/Plan:      The patient's hypertensive risk group is category C: Target organ damage and/or diabetes.  Today's blood pressure is 160/110.  His blood pressure goal is < 140/90.  Patient Instructions: 1)  Schedule appt with Susie Piper for education on diet with hypertension. 2)  Get back on your CPAP machine every night. 3)  Count up calories for 2 weeks and bring to your appt with the nutritionist. 4)  Increase activity.  Start low and go slow.  Walk 5 minutes a day and add a minute every few days until you get to 30 minutes a day most days of the week. 5)  Try to lose 10 pounds. 6)  Congratulations on quitting smoking!!  Keep up the good work. 7)  Take hydralazine with your other medicines for blood pressure.  Do not stop anything else. 8)  Please schedule a follow-up appointment in 1 month with Nicki Reaper  for blood pressure. 9)  Come fasting to your next appt for labs (nothing to eat or drink after midnight except water).  Prescriptions: HYDRALAZINE HCL 25 MG TABS (HYDRALAZINE HCL) Take 1 tablet by mouth three times a day  #90 x 5   Entered and  Authorized by:   [name] PA-C   Signed by:   [name] PA-C on 02/16/2010   Method used:   Print then Give to Patient   RxID:   [id number]

## 2011-01-05 NOTE — Letter (Signed)
Summary: *Referral Letter  [facility]  [address] [place], [place] [address]   Phone: [phone]  Fax: [phone]    02/25/2010  Thank you in advance for agreeing to see my patient:  Victor Thompson [address] [place], [place]  [address]  Phone: [phone]  Reason for Referral: 50 yo male with longstanding h/o uncontrolled HTN with noted proteinuria and borderline creatinine.  24 hour urine demonstrates creatinine clearance of 65 mL/min.  and protein is 1104 mg/dL.   A renal ultrasound, SPEP and UPEP have been ordered.  Please evaluate.  Procedures Requested:   Current Medical Problems: 1)  RENAL INSUFFICIENCY (ICD-588.9) 2)  PROTEINURIA (ICD-791.0) 3)  ISCHEMIC CARDIOMYOPATHY (ICD-414.8) 4)  SLEEP APNEA (ICD-780.57) 5)  PENILE PAIN (ICD-607.89) 6)  SHOULDER STRAIN, LEFT (ICD-840.9) 7)  EXTERNAL HEMORRHOIDS (ICD-455.3) 8)  SMOKER (ICD-305.1) 9)  GROIN STRAIN, RIGHT (ICD-848.8) 10)  ESSENTIAL HYPERTENSION (ICD-401.9) 11)  EXAMINATION, ROUTINE MEDICAL (ICD-V70.0) 12)  RECTAL BLEEDING (ICD-569.3) 13)  DENTAL CARIES (ICD-521.00) 14)  ERECTILE DYSFUNCTION (ICD-302.72) 15)  OBESITY (ICD-278.00) 16)  CHF (ICD-428.0) 17)  HYPERLIPIDEMIA NEC/NOS (ICD-272.4) 18)  CAD (ICD-414.00) 19)  ASTHMA (ICD-493.90)   Current Medications: 1)  VIAGRA 100 MG TABS (SILDENAFIL CITRATE) 1 by mouth 1 hour prior to sex 2)  CADUET 10-40 MG TABS (AMLODIPINE-ATORVASTATIN) 1 by mouth q am 3)  HYZAAR 100-25 MG TABS (LOSARTAN POTASSIUM-HCTZ) 1 by mouth q am 4)  NIASPAN 500 MG TBCR (NIACIN (ANTIHYPERLIPIDEMIC)) 1 by mouth once daily 5)  TOPROL XL 100 MG TB24 (METOPROLOL SUCCINATE) Take 1 tablet by mouth q 12hrs 6)  ASPIR-LOW 81 MG TBEC (ASPIRIN) 1 by mouth once daily 7)  BENAZEPRIL HCL 20 MG  TABS (BENAZEPRIL HCL) 1 by mouth once daily 8)  ALBUTEROL 90 MCG/ACT AERS (ALBUTEROL) Use 2 puffs every 4-6 hours as needed wheezing and cough 9)  ADVAIR DISKUS 100-50 MCG/DOSE  MISC (FLUTICASONE-SALMETEROL) Take 1 inhalation two times a day 10)  NAPROSYN 375 MG TABS (NAPROXEN) 1 by mouth two times a day for 3 days, then as needed 11)  CHANTIX STARTING MONTH PAK 0.5 MG X 11 & 1 MG X 42 TABS (VARENICLINE TARTRATE) as directed 12)  CHANTIX CONTINUING MONTH PAK 1 MG TABS (VARENICLINE TARTRATE) as directed 13)  PROCTOSOL HC 2.5 % CREA (HYDROCORTISONE) apply two times a day to three times a day as needed 14)  CLONIDINE HCL 0.1 MG TABS (CLONIDINE HCL) Take 1 tablet by mouth two times a day for blood pressure 15)  FLEXERIL 10 MG TABS (CYCLOBENZAPRINE HCL) 1 by mouth once daily to two times a day as needed for muscle spasm 16)  HYDRALAZINE HCL 25 MG TABS (HYDRALAZINE HCL) Take 1 tablet by mouth three times a day   Past Medical History: 1)  Current Problems:  2)  CAD (ICD-414.00) sees Dr.Harwani 3)    a.  NSTEMI 2005; tx at [facility] with PCI (? vessel) 4)    b.  EF 40-45% at cath in 2005 at [facility] 5)  Ischemic Cardiomyopathy 6)  Chronic Systolic CHF (99991111) 7)    a.  Echo 01/2010:  Mod LVH; EF 40-45%; basal-mid inf. AK; mod Inferolat. HK; Grade 1 Dias. Dysfxn; mild LAE 8)  HYPERLIPIDEMIA NEC/NOS (ICD-272.4) 9)  Hypertension 10)  ASTHMA (ICD-493.90) dx 2000 11)  PROTEINURIA dx12/9/05 12)  OBESITY (ICD-278.00)   Prior History of Blood Transfusions:   Pertinent Labs:    Thank you again for agreeing to see our patient; please contact us if you  have any further questions or need additional information.  Sincerely,  [name] PA-C

## 2011-01-05 NOTE — Assessment & Plan Note (Signed)
Summary: SHARP PAINS IN GROINS AREA//KT   Vital Signs:  Patient profile:   50 year old male Height:      66.5 inches Weight:      239 pounds Temp:     97.9 degrees F oral Pulse rate:   72 / minute Pulse rhythm:   regular Resp:     18 per minute BP sitting:   140 / 100  (left arm) Cuff size:   large  Vitals Entered By: [place] Shannon (June 03, 2009 3:12 PM)  CC:  pt says this pain has been there for a month.... pt unaware  how it happen.....  History of Present Illness: 50 yo male with right groin pain x 1 month.  Started in groin.  Radiates to buttock.  No back pain.  No swelling in groin.  No fever.  No chills.  No change in bowel habits.  No change in urinary habits.  No penile discharge.  No pain radiating down leg.  No leg weakness.  No numbness.  Patient with h/o HTN.  No meds. today.  He slept in b/c of day off.    He would like something to help him quit smoking.   No h/o depression or meds. for depression.  Current Medications (verified): 1)  Viagra 100 Mg Tabs (Sildenafil Citrate) .Marland Kitchen.. 1 By Mouth 1 Hour Prior To Sex 2)  Caduet 10-40 Mg Tabs (Amlodipine-Atorvastatin) .Marland Kitchen.. 1 By Mouth Q Am 3)  Hyzaar 100-25 Mg Tabs (Losartan Potassium-Hctz) .Marland Kitchen.. 1 By Mouth Q Am 4)  Niaspan 500 Mg Tbcr (Niacin (Antihyperlipidemic)) .Marland Kitchen.. 1 By Mouth Once Daily 5)  Toprol Xl 100 Mg Tb24 (Metoprolol Succinate) .... Take 1 Tablet By Mouth Q 12hrs 6)  Aspir-Low 81 Mg Tbec (Aspirin) .Marland Kitchen.. 1 By Mouth Once Daily 7)  Benazepril Hcl 20 Mg  Tabs (Benazepril Hcl) .Marland Kitchen.. 1 By Mouth Once Daily 8)  Albuterol 90 Mcg/act Aers (Albuterol) .... Use 2 Puffs Every 4-6 Hours As Needed Wheezing and Cough 9)  Advair Diskus 100-50 Mcg/dose Misc (Fluticasone-Salmeterol) .... Take 1 Inhalation Two Times A Day  Allergies (verified): 1)  ! Lisinopril 2)  ! Lidocaine Hcl (Lidocaine Hcl (Local Anesth.))  Physical Exam  General:  Well-developed,well-nourished,in no acute distress; alert,appropriate and cooperative  throughout examination Head:  Normocephalic and atraumatic without obvious abnormalities. No apparent alopecia or balding. Lungs:  clear to ausc. bilat. Heart:  reg. rate and rhythm; normal S1,S2; no murmur Abdomen:  soft, nontender Msk:  strength 5/5 bilat. lower ext. Pulses:  pt/dp 2+ bilat. Extremities:  no edema Neurologic:  patellar DTR's 2+ bilat. Inguinal Nodes:  no adenopathy   Impression & Recommendations:  Problem # 1:  GROIN STRAIN, RIGHT (ICD-848.8) Suspect groin strain. Rest x 2 weeks (ie no squatting, heavy lifting, exertion, etc). Naprosyn 375mg   two times a day x 1 week and as needed after that.  Will warn him to avoid prolonged usage with h/o CAD. If no better at f/u, consider plain films of back and hip; consider PT referral.  Problem # 2:  SMOKER (ICD-305.1)  Cessation discussed.  Will try chantix.  Discussed side effects and when to stop medication.  His updated medication list for this problem includes:    Chantix Starting Month Pak 0.5 Mg X 11 & 1 Mg X 42 Tabs (Varenicline tartrate) .Marland Kitchen... As directed    Chantix Continuing Month Pak 1 Mg Tabs (Varenicline tartrate) .Marland Kitchen... As directed  Problem # 3:  ESSENTIAL HYPERTENSION (ICD-401.9) Uncontrolled.  No meds today.  Instructed him to take meds every day.  Will f/u at next appt.  If continues to be elevated, will consider adjustment in meds.  He is on max. dose amlodipine; ACE +ARB; thiazide; beta blocker.  Could consider clonidine.  Hopefully smoking cessation will help with lowering BP.  Will need to discuss weight loss at next visit. His updated medication list for this problem includes:    Caduet 10-40 Mg Tabs (Amlodipine-atorvastatin) .Marland Kitchen... 1 by mouth q am    Hyzaar 100-25 Mg Tabs (Losartan potassium-hctz) .Marland Kitchen... 1 by mouth q am    Toprol Xl 100 Mg Tb24 (Metoprolol succinate) .Marland Kitchen... Take 1 tablet by mouth q 12hrs    Benazepril Hcl 20 Mg Tabs (Benazepril hcl) .Marland Kitchen... 1 by mouth once daily  Problem # 4:  CAD  (ICD-414.00) Patient has h/o NSTEMI treated at [facility] in 2005 with PTCA and stenting per the records.  Last cath in 10/2005 by Dr. Terrence Dupont.  Mod. nonobs. disease noted at that time.  No mention of where stent located.  EF was 40-45%.  Records indicate a h/o CHF.  No evidence of an ECHO in [place].  Patient has not seen cardiology since 2006.  This is my first meeting with the patient.  His records were reviewed after he was discharged.  On f/u, we will get a f/u EKG and send him for f/u Echo.  If EF is still down, will likely arrange f/u with cardiology.  If EF still down, will consider changing Toprol to carvedilol as well as adding hydralazine + nitrates.   His updated medication list for this problem includes:    Caduet 10-40 Mg Tabs (Amlodipine-atorvastatin) .Marland Kitchen... 1 by mouth q am    Hyzaar 100-25 Mg Tabs (Losartan potassium-hctz) .Marland Kitchen... 1 by mouth q am    Toprol Xl 100 Mg Tb24 (Metoprolol succinate) .Marland Kitchen... Take 1 tablet by mouth q 12hrs    Aspir-low 81 Mg Tbec (Aspirin) .Marland Kitchen... 1 by mouth once daily    Benazepril Hcl 20 Mg Tabs (Benazepril hcl) .Marland Kitchen... 1 by mouth once daily  Problem # 5:  RECTAL BLEEDING (ICD-569.3) Note patient has a h/o this.  He states he had a colonoscopy and it appears this was done.  However, I cannot pull up the results.  Will try to obtain this prior to his return visit.  Problem # 6:  HYPERLIPIDEMIA NEC/NOS (ICD-272.4) Last assessment was in 10/09 and lipid status was optimal then.  Will need f/u lipids at next CPE in 09/2009. His updated medication list for this problem includes:    Caduet 10-40 Mg Tabs (Amlodipine-atorvastatin) .Marland Kitchen... 1 by mouth q am    Niaspan 500 Mg Tbcr (Niacin (antihyperlipidemic)) .Marland Kitchen... 1 by mouth once daily  Complete Medication List: 1)  Viagra 100 Mg Tabs (Sildenafil citrate) .Marland Kitchen.. 1 by mouth 1 hour prior to sex 2)  Caduet 10-40 Mg Tabs (Amlodipine-atorvastatin) .Marland Kitchen.. 1 by mouth q am 3)  Hyzaar 100-25 Mg Tabs (Losartan potassium-hctz) .Marland Kitchen.. 1 by mouth  q am 4)  Niaspan 500 Mg Tbcr (Niacin (antihyperlipidemic)) .Marland Kitchen.. 1 by mouth once daily 5)  Toprol Xl 100 Mg Tb24 (Metoprolol succinate) .... Take 1 tablet by mouth q 12hrs 6)  Aspir-low 81 Mg Tbec (Aspirin) .Marland Kitchen.. 1 by mouth once daily 7)  Benazepril Hcl 20 Mg Tabs (Benazepril hcl) .Marland Kitchen.. 1 by mouth once daily 8)  Albuterol 90 Mcg/act Aers (Albuterol) .... Use 2 puffs every 4-6 hours as needed wheezing and cough 9)  Advair Diskus 100-50 Mcg/dose Misc (Fluticasone-salmeterol) .Marland KitchenMarland KitchenMarland Kitchen  Take 1 inhalation two times a day 10)  Naprosyn 375 Mg Tabs (Naproxen) .Marland Kitchen.. 1 by mouth two times a day for 1 week; then as needed. 11)  Chantix Starting Month Pak 0.5 Mg X 11 & 1 Mg X 42 Tabs (Varenicline tartrate) .... As directed 12)  Chantix Continuing Month Pak 1 Mg Tabs (Varenicline tartrate) .... As directed  Patient Instructions: 1)  Please schedule a follow-up appointment in 2 weeks.  2)  Take Naprosyn for 1 week straight.  Take only as needed for pain after that.  It is not good to take this medicine all the time when you have a history of a blockage in your heart or stent.   3)  No heavy lifting of straining or squatting for 2 weeks. Prescriptions: CHANTIX CONTINUING MONTH PAK 1 MG TABS (VARENICLINE TARTRATE) as directed  #1 pack x 1   Entered and Authorized by:   [name] PA-C   Signed by:   [name] PA-C on 06/03/2009   Method used:   Print then Give to Patient   RxID:   [id number] CHANTIX STARTING MONTH PAK 0.5 MG X 11 & 1 MG X 42 TABS (VARENICLINE TARTRATE) as directed  #1 pack x 0   Entered and Authorized by:   [name] PA-C   Signed by:   [name] PA-C on 06/03/2009   Method used:   Print then Give to Patient   RxID:   [id number] NAPROSYN 375 MG TABS (NAPROXEN) 1 by mouth two times a day for 1 week; then as needed.  #30 x 0   Entered and Authorized by:   [name] PA-C   Signed by:   [name] PA-C on 06/03/2009   Method used:   Print then Give to Patient   RxID:    [id number]

## 2011-01-05 NOTE — Miscellaneous (Signed)
Summary: VIP  Patient: Victor Thompson Note: All result statuses are Final unless otherwise noted.  Tests: (1) VIP (Medications)   LLIMPORTMEDS              "Result Below..."       RESULT: VIAGRA TABS 100 MG*TAKE 1 TABLETS BY MOUTH 1 HOUR PRIOR TO ACTIVITY  AS DIRECTED.  DO NOT TAKE MORE THAN 1 TABLET IN 24 HOURS.*07/12/2007*Last Refill: 09/14/2007*[id number]*******   LLIMPORTMEDS              "Result Below..."       RESULT: TOPROL XL TB24 100 MG*TAKE ONE (1) TABLET BY MOUTH EVERY 12 HOURS  GENE ICP TOPROL XL 100MG  07/08*08/18/2007*Last Refill: 09/14/2007*[id number]*******   LLIMPORTMEDS              "Result Below..."       RESULT: NIASPAN TBCR 500 MG*TAKE ONE (1) TABLET BY MOUTH EVERY DAY  GENE  ICP NIASPAN 500MG  04/08*09/14/2007*Last Refill: [id number]*******   LLIMPORTMEDS              "Result Below..."       RESULT: HYZAAR TABS 100-25 MG*TAKE ONE TABLET BY MOUTH EVERY MORNING  GENE ICP HYZAAR 100/25MG  07/08*09/14/2007*Last Refill: [id number]*******   LLIMPORTMEDS              "Result Below..."       RESULT: CADUET TABS 10-40 MG*TAKE ONE TABLET BY MOUTH EVERY MORNING  GENE  ICP CADUET 10/40MG  09/08*09/14/2007*Last Refill: [id number]*******   LLIMPORTMEDS              "Result Below..."       RESULT: BENAZEPRIL HCL TABS 20 MG*TAKE ONE TABLET BY MOUTH EVERY MORNING*09/14/2007*Last Refill: Unknown*[id number]*******   LLIMPORTMEDS              "Result Below..."       RESULT: ASPIR-LOW TBEC 81 MG*TAKE ONE (1) TABLET BY MOUTH EVERY DAY 30 MINUTES PRIOR TO NIASPAN*09/14/2007*Last Refill: [id number]*******   LLIMPORTALLS              "Result Below..."       RESULT: LISINOPRIL ORAL (ZESTRIL)*[id number]**   LLIMPORTALLS              "Result Below..."       RESULT: PROCAINE HCL INJ. (NOVOCAIN)***  Note: An exclamation mark (!) indicates a result that was not dispersed into the flowsheet. Document Creation Date: 10/05/2007 2:58 PM _______________________________________________________________________  (1)  Order result status: Final Collection or observation date-time: 08/23/2007 Requested date-time: 08/23/2007 Receipt date-time:  Reported date-time: 08/23/2007 Referring Physician:   Ordering Physician:   Specimen Source:  Source: Charlyne Mom Order Number:  Lab site:

## 2011-01-05 NOTE — Miscellaneous (Signed)
Summary: Colonoscopy Results (Hemorrhoids)  Clinical Lists Changes  Problems: Added new problem of EXTERNAL HEMORRHOIDS (ICD-455.3) Observations: Added new observation of COLONOSCOPY: Location:  [facility].   (10/16/2008 8:59) Added new observation of COLONRECACT: Repeat colonoscopy in 10 years.   (10/16/2008 8:59) Added new observation of COLONOSCOPY:  Results: Hemorrhoids.      (10/16/2008 8:59)      Colonoscopy  Procedure date:  10/16/2008  Findings:       Results: Hemorrhoids.       Comments:      Repeat colonoscopy in 10 years.    Colonoscopy  Procedure date:  10/16/2008  Findings:      Location:  [facility].

## 2011-01-05 NOTE — Progress Notes (Signed)
  Phone Note Outgoing Call   Summary of Call: CMA to call pt. Meds Rf x 1 month. Pt not seen since 12/08. No refill on amox,ibuprofen,or viagra. schedule to see MD and must come fasting. Initial call taken by: Milagros Evener MD,  August 23, 2008 1:00 PM  Follow-up for Phone Call        pt notified and rx to called into pharmacy. pt states last visit he wasnt told that he needed to be seen sooner for refills. (last visit didnt need to schedule until 1year per note) Follow-up by: Vinetta Bergamo CMA,  August 23, 2008 4:35 PM

## 2011-01-05 NOTE — Letter (Signed)
Summary: RECORDS FAXED TO [facility]  RECORDS FAXED TO [facility]   Imported By: Roland Earl 05/13/2010 11:25:48  _____________________________________________________________________  External Attachment:    Type:   Image     Comment:   External Document

## 2011-01-05 NOTE — Progress Notes (Signed)
Summary: Sinus  Phone Note Call from Patient Call back at [facility] Phone [phone]   Caller: Patient Summary of Call: The pt is having sinus infection and he wants to know what kind of medication over the counter he can take.  [facility]) Dr. Radene Ou   Initial call taken by: Alexis Goodell,  November 15, 2008 4:43 PM  Follow-up for Phone Call        pt states have sinus pressure x7days. c/o headaches and vision problems since its so painful. pt states using otc meds but no relief. pt also states also hurts in his head when he coughs. pt denies any drainage that he knows of. pt uses [facility]. Follow-up by: Vinetta Bergamo CMA,  November 18, 2008 8:59 AM  Additional Follow-up for Phone Call Additional follow up Details #1::        The pt called back again because he has sinus pressure and he would like to receive a phone call. Additional Follow-up by: Alexis Goodell,  November 18, 2008 12:31 PM    Additional Follow-up for Phone Call Additional follow up Details #2::    Call PT: Clarify.Marland KitchenMarland KitchenCold sxs?Fever?sinus congestion?sorethroat? What makes him think he has a sinus infection rather than just a headache? Just need to document.  If he does have URI/sinus sxs then MD recommends the following:  #1 Get a mucolytic/expectorant OTC...like mucinex or generic.  #2 May use OTC Afrin nasal spray or generic for THREE days only for nasal congestion.  #3 CMA to call in [employer](5 days of abx that works in system for 10 days) to [facility] of patient's choice.Marland KitchenMarland KitchenPt to pick up and take Abx only if symptoms not improving over next 3-4 days. Most head colds are viral and resolve in 7-10 days.... Follow-up by: Milagros Evener MD,  November 19, 2008 8:57 AM  Additional Follow-up for Phone Call Additional follow up Details #3:: Details for Additional Follow-up Action Taken: spoke with pt and did not state that pt had sinus infection. I stated pt had sinus pressure.pt also states no sinus  congestion/no sorethroat/no fever/. pt has used mucinex which has been helping his cough. Pt also advised of treatment plan. Additional Follow-up by: Vinetta Bergamo CMA,  November 19, 2008 9:26 AM  New/Updated Medications: AZITHROMYCIN 250 MG  TABS (AZITHROMYCIN) 2 by  mouth today and then 1 daily for 4 days   Prescriptions: AZITHROMYCIN 250 MG  TABS (AZITHROMYCIN) 2 by  mouth today and then 1 daily for 4 days  #6 x 0   Entered and Authorized by:   Milagros Evener MD   Signed by:   Milagros Evener MD on 11/19/2008   Method used:   Telephoned to ...         [id number]

## 2011-01-05 NOTE — Letter (Signed)
Summary: TAKE HOME INSTRUCTIONS/[place]  TAKE HOME INSTRUCTIONS/[place]   Imported By: Roland Earl 06/15/2010 10:46:01  _____________________________________________________________________  External Attachment:    Type:   Image     Comment:   External Document

## 2011-01-05 NOTE — Letter (Signed)
Summary: *HSN Results Follow up  [place], [place] [address]   Phone: [phone]  Fax: [phone]      02/10/2010   [name] [place], [place]  [address]   Dear  [name],                            ____S.Drinkard,FNP   ____D. Gore,FNP       ____B. McPherson,MD   ____V. Rankins,MD    ____E. Mulberry,MD    ____N. Hassell Done, FNP  ____D. Jobe Igo, MD    ____K. Tomma Lightning, MD    __x__[name], PA-C     This letter is to inform you that your recent test(s):  _______Pap Smear    _______Lab Test     ___x____Echocardiogram    _______ is within acceptable limits  _______ requires a medication change  _______ requires a follow-up lab visit  ___x____ requires a follow-up visit with your provider   Comments:  Your heart function has not changed since your heart attack.  It is still slightly reduced.  We need to work on your blood pressure.  Make sure you follow up as planned.       _________________________________________________________ If you have any questions, please contact our office                     Sincerely,  [name] PA-C [facility]

## 2011-01-05 NOTE — Progress Notes (Signed)
Summary: Needs Nephrology Referral  Phone Note Outgoing Call   Summary of Call: Victor Thompson Please refer to nephrology Initial call taken by: Versie Starks,  March 11, 2010 9:39 PM

## 2011-01-05 NOTE — Letter (Signed)
Summary: NUTRITIONIST SUMMARY//[facility]  NUTRITIONIST SUMMARY//[facility]   Imported By: Roland Earl 03/04/2010 11:05:18  _____________________________________________________________________  External Attachment:    Type:   Image     Comment:   External Document

## 2011-01-05 NOTE — Letter (Signed)
Summary: Work Excuse  [facility]  [address] [place], [place] [address]   Phone: [phone]  Fax: [phone]    Today's Date: June 03, 2009  Name of Patient: Victor Thompson  The above named patient had a medical visit today at:  am / pm.  Please take this into consideration when reviewing the time away from work/school.    Special Instructions:  [  ] None  [  ] To be off the remainder of today, returning to the normal work / school schedule tomorrow.  [  ] To be off until the next scheduled appointment on ______________________.  [ X ] Other ______Please allow the patient to do light duty for the next 2 weeks (no excessive squatting, heavy lifting over 10 pounds, pushing or pulling).__________________________________________________________ ________________________________________________________________________   Sincerely yours,   [place] Shannon

## 2011-01-05 NOTE — Progress Notes (Signed)
  Phone Note Other Incoming   Reason for Call: Discuss lab or test results Summary of Call: I have not seen the colonoscopy results yet.  Initial call taken by: [name] PA-C,  September 23, 2009 6:00 PM  Follow-up for Phone Call        spoke with a guy this time and he said he will fax them over to me Follow-up by: [place] Shannon,  September 24, 2009 12:24 PM

## 2011-01-05 NOTE — Letter (Signed)
Summary: *HSN Results Follow up  [place], [place] [address]   Phone: [phone]  Fax: [phone]      01/19/2010   [name] [place], [place]  [address]   Dear  [name],                            ____S.Drinkard,FNP   ____D. Gore,FNP       ____B. McPherson,MD   ____V. Rankins,MD    ____E. Mulberry,MD    ____N. Hassell Done, FNP  ____D. Jobe Igo, MD    ____K. Tomma Lightning, MD    __x__[name], PA-C    This letter is to inform you that your recent test(s):  _______Pap Smear    ___x____Lab Test     _______X-ray    ___x____ is within acceptable limits  _______ requires a medication change  _______ requires a follow-up lab visit  _______ requires a follow-up visit with your provider   Comments:       _________________________________________________________ If you have any questions, please contact our office                     Sincerely,  [name] PA-C [facility]

## 2011-01-05 NOTE — Letter (Signed)
Summary: *HSN Results Follow up  [place], [place] [address]   Phone: [phone]  Fax: [phone]      03/20/2010   ALOYSIUS WOLBER [address] [place], [place]  [address]   Dear  Mr. ADD JEZEWSKI,                            ____S.Drinkard,FNP   ____D. Gore,FNP       ____B. McPherson,MD   ____V. Rankins,MD    ____E. Mulberry,MD    ____N. Hassell Done, FNP  ____D. Jobe Igo, MD    ____K. Tomma Lightning, MD    __x__[name], PA-C     This letter is to inform you that your recent test(s):  _______Pap Smear    ___x____Lab Test     _______X-ray    _______ is within acceptable limits  _______ requires a medication change  _______ requires a follow-up lab visit  _______ requires a follow-up visit with your provider   Comments: Kidney function has not changed.  Continue your current medications.       _________________________________________________________ If you have any questions, please contact our office                     Sincerely,  [name] PA-C [facility]

## 2011-01-05 NOTE — Letter (Signed)
Summary: *[facility] Results Follow up  [place], [place] [address]   Phone: [phone]  Fax: [phone]      10/02/2008   Victor Thompson [address] [place], [place]  [address]   Dear  [name],                            ____S.Drinkard,FNP   ____D. Gore,FNP       ____B. McPherson,MD  __X__V. Rupa Lagan,MD    ____E. Mulberry,MD    ____N. Hassell Done, FNP  ____D. Jobe Igo, MD    ____K. Tomma Lightning, MD    ____Other     This letter is to inform you that your recent test(s):  _______Pap Smear    ___X____Lab Test     _______X-ray    ___X____ is within acceptable limits  _______ requires a medication change  _______ requires a follow-up lab visit  _______ requires a follow-up visit with your provider   Comments: All of your blood tests look good       _________________________________________________________ If you have any questions, please contact our office                     Sincerely,  Milagros Evener MD [facility]

## 2011-01-05 NOTE — Medication Information (Signed)
Summary: COLONOSCOPY PREP  COLONOSCOPY PREP   Imported By: Roland Earl 10/02/2008 16:47:54  _____________________________________________________________________  External Attachment:    Type:   Image     Comment:   External Document

## 2011-02-22 LAB — CBC
HCT: 44.1 % (ref 39.0–52.0)
Hemoglobin: 15.1 g/dL (ref 13.0–17.0)
MCHC: 34.1 g/dL (ref 30.0–36.0)
MCV: 94.4 fL (ref 78.0–100.0)
Platelets: 191 10*3/uL (ref 150–400)
RBC: 4.67 MIL/uL (ref 4.22–5.81)
RDW: 15.1 % (ref 11.5–15.5)
WBC: 7 10*3/uL (ref 4.0–10.5)

## 2011-02-22 LAB — DIFFERENTIAL
Basophils Absolute: 0 10*3/uL (ref 0.0–0.1)
Basophils Relative: 1 % (ref 0–1)
Eosinophils Absolute: 0.2 10*3/uL (ref 0.0–0.7)
Eosinophils Relative: 2 % (ref 0–5)
Lymphocytes Relative: 12 % (ref 12–46)
Lymphs Abs: 0.8 10*3/uL (ref 0.7–4.0)
Monocytes Absolute: 0.2 10*3/uL (ref 0.1–1.0)
Monocytes Relative: 3 % (ref 3–12)
Neutro Abs: 5.7 10*3/uL (ref 1.7–7.7)
Neutrophils Relative %: 82 % — ABNORMAL HIGH (ref 43–77)

## 2011-02-22 LAB — BASIC METABOLIC PANEL
BUN: 19 mg/dL (ref 6–23)
CO2: 27 mEq/L (ref 19–32)
Calcium: 8.5 mg/dL (ref 8.4–10.5)
Chloride: 107 mEq/L (ref 96–112)
Creatinine, Ser: 2.13 mg/dL — ABNORMAL HIGH (ref 0.4–1.5)
GFR calc Af Amer: 41 mL/min — ABNORMAL LOW (ref >60)
GFR calc non Af Amer: 34 mL/min — ABNORMAL LOW (ref >60)
Glucose, Bld: 128 mg/dL — ABNORMAL HIGH (ref 70–99)
Potassium: 3.5 mEq/L (ref 3.5–5.1)
Sodium: 138 mEq/L (ref 135–145)

## 2011-04-23 NOTE — Discharge Summary (Signed)
NAMEFREDIE, Victor Thompson                ACCOUNT NO.:  [id number]   MEDICAL RECORD NO.:  [id number]          PATIENT TYPE:  INP   LOCATION:  2039                         FACILITY:  [facility]   PHYSICIAN:  Allegra Lai. Terrence Dupont, M.D. DATE OF BIRTH:  [birth year]   DATE OF ADMISSION:  10/22/2005  DATE OF DISCHARGE:  10/24/2005                                 DISCHARGE SUMMARY   ADMITTING DIAGNOSES:  1.  Acute coronary syndrome.  2.  Hypertensive urgency.  3.  Coronary artery disease.  4.  History of myocardial infarction in the past.  5.  Elevated blood sugars, rule out diabetes mellitus.  6.  Tobacco abuse.  7.  Hypercholesterolemia.  8.  History of cocaine abuse.  9.  Mild renal insufficiency.   DISCHARGE DIAGNOSES:  1.  Status post small non-Q wave myocardial infarction.  2.  Hypertensive urgency.  3.  Status post left catheterization.  4.  Coronary artery disease.  5.  History of myocardial infarction in the past.  6.  Status post percutaneous coronary intervention in the past at [facility].  7.  Glucose intolerance.  8.  Tobacco abuse.  9.  Hypercholesterolemia.  10. History of cocaine abuse.  11. Status post mild renal insufficiency.  12. Status post hypokalemia.   DISCHARGE HOME MEDICATIONS:  1.  Coreg 25 mg 1 tablet every 12 hours.  2.  Altace 10 mg 1 capsule twice daily.  3.  Hyzaar 100/12.5 1 tablet daily.  4.  Imdur 120 mg 1 tablet daily in the morning.  5.  Lipitor 40 mg 1 tablet daily.  6.  Niaspan 500 mg 1 tablet daily.  7.  Baby aspirin 81 mg 1 tablet daily.   DIET:  Low-salt, low-cholesterol, 1800-calorie ADA diet.  The patient has  been advised to avoid sweets.   Post-cardiac catheterization instructions have been given.  Avoid heavy  lifting, pushing, or pulling for 48 hours, follow up with me on October 27, 2005.  The patient has been advised to monitor blood pressure and record.   CONDITION AT DISCHARGE:  Stable.   BRIEF HISTORY AND HOSPITAL COURSE:  Victor Thompson is a  50 year old black male  with past medical history significant for coronary artery disease, history  of MI approximately one year ago, status post PTCA and stenting at [facility],  history of CHF, hypertension, hypercholesterolemia, tobacco abuse, history  of cocaine abuse in the past, came to the ER complaining of retrosternal  chest pain described as heaviness, grade 7/10, radiating to the left arm,  off and on since yesterday.  He went to Urgent Care yesterday and was  discharged.  This a.m. while going to work, he had similar chest pain, took  Zantac and Mylanta without relief, so decided to come to the ER.  An EKG  done in the ER showed normal sinus rhythm with LVH with strain pattern  versus inferolateral wall ischemia.  No old records or EKGs were available.  The patient was also noted to have minimally elevated troponin-I.  The  patient was started on IV heparin and nitrates with relief of  chest pain.   PAST MEDICAL HISTORY:  As above.   PAST SURGICAL HISTORY:  None.   ALLERGIES:  He has questionable allergy to LIDOCAINE.   MEDICATIONS AT HOME:  1.  Toprol XL.  2.  Hydrochlorothiazide.  3.  Nifedipine.  4.  Isosorbide.  5.  Aspirin.  6.  Lorcet.  7.  Lovastatin.  8.  Singulair.   SOCIAL HISTORY:  He is single, has two children, smoked one pack per day for  15 years, no history of alcohol history.  Smoked cocaine, last smoked  approximately three years ago.  No history of IVDA.  He does Architect  work.   FAMILY HISTORY:  Father died at an early age, cause not known.  Mother is  alive.  She is hypertensive. One sister is hypertensive.   PHYSICAL EXAMINATION:  GENERAL:  He is alert, awake, oriented x3, in no  acute distress.  VITAL SIGNS:  Blood pressure 184/122, pulse 88.  Repeat blood pressure after  IV nitrates and Lopressor was 142/92, pulse 78.  HEENT:  Conjunctivae pink.  Neck supple, no JVD, no bruits.  LUNGS:  Clear to auscultation without rhonchi or rales.   CARDIOVASCULAR:  S1 and S2 was normal.  There was soft S4 gallop.  ABDOMEN:  Soft, bowel sounds present, nontender.  EXTREMITIES:  There was no clubbing, cyanosis, or edema.   LABORATORY DATA:  Potassium 3.5, glucose 101, creatinine 1.4, BUN 18.  Troponin-I was 0.08.  Hemoglobin was 17, hematocrit 51.  Repeat troponin was  0.39.  Today, troponin has come down to 0.30.  CPK was 378, MB 7.2.  Repeat  CPK 308, MB 4.  Index 1.3.  An EKG showed normal sinus rhythm with LVH with  strain pattern versus inferolateral wall ischemia.  Repeat EKG done  yesterday  showed normal sinus rhythm, LVH with marked improvement in ST-T  wave changes.   BRIEF HOSPITAL COURSE:  The patient was admitted to stepdown unit and  underwent left cardiac catheterization with selective left and right  coronary angiography, aortography, and visualization of bilateral renal  arteries on October 22, 2005 as per procedure report.  The patient  tolerated the procedure well.  There were no complications.  The patient was  transferred to the telemetry unit in stable condition.  The patient did not  have any further episodes of chest pain during the hospital stay.  His groin  is stable.  There is no evidence of hematoma or bruit.  The patient will  discharged home on the above medications and will be followed up in my  office next week.           ______________________________  Allegra Lai. Terrence Dupont, M.D.     MNH/MEDQ  D:  10/24/2005  T:  10/24/2005  Job:  [id number]

## 2011-04-23 NOTE — Procedures (Signed)
NAME:  Victor Thompson, Victor Thompson                ACCOUNT NO.:  [id number]   MEDICAL RECORD NO.:  [id number]          PATIENT TYPE:  OUT   LOCATION:  SLEEP CENTER                 FACILITY:  [facility]   PHYSICIAN:  Clinton D. Annamaria Boots, M.D. DATE OF BIRTH:  [birth year]   DATE OF STUDY:                              NOCTURNAL POLYSOMNOGRAM   REFERRING PHYSICIAN:  Dr. Jerrell Belfast   DATE OF STUDY:  January 27, 2006.   INDICATIONS:  For study: Hypersomnia with sleep apnea.   EPWORTH SLEEPINESS SCORE:  3/24.   BMI:  39.   WEIGHT:  250 pounds.   HOME MEDICATIONS:  Niaspan, Norvasc, Lipitor, isosorbide, Hyzaar, Coreg,  Altace.   SLEEP ARCHITECTURE:  Total sleep time 370 minutes with sleep efficiency 88%.  Stage I was 5%, stage II 81%, stages III and IV absent, REM 13% of total  sleep time. Sleep latency 8 minutes, REM latency 57 minutes, awake after  sleep onset 45 minutes, arousal index 18. No bedtime medication taken.   RESPIRATORY DATA:  Apnea/hypopnea index (AHI, RDI) 41.1 obstructive events  per hour indicating moderately severe obstructive sleep apnea/hypopnea  syndrome. This included 1 central apnea, 86 obstructive apneas and 167  hypopneas. Events were heavily associated with supine sleep position (AHI  57.1 per hour) but he slept mostly on his back for at least 80% of the  night. REM AHI 42 per hour. He qualified for C-PAP titration by split  protocol but the technician could not get him sufficiently comfortable to  keep any C-PAP mask on. He kept pulling the mask off. He complained of nasal  congestion which was improved with the Breathe-Right strip. The patient was  described as very frustrated with the idea of the mask. Split protocol  could not be completed and the study was finished as a diagnostic NPSG.   OXYGEN DATA:  Loud snoring and mouth breathing with oxygen desaturation to a  nadir of 78%. Mean oxygen saturation through the study was 94% on room air.   CARDIAC DATA:  Sinus rhythm  with PVCs.   MOVEMENT/PARASOMNIA:  Occasional leg jerks with insignificant impact on  sleep quality.   IMPRESSION/RECOMMENDATIONS:  1.  Moderately severe obstructive sleep apnea/hypopnea syndrome, AHI 41.1      per hour, most commonly while supine and in REM. Loud snoring with      oxygen desaturation to 78%.  2.  C-PAP titration could not be performed because the patient could not get      comfortable with any mask design tried. He complained of nasal      congestion and was not comfortable wearing any mask design on the study      night. He may respond to education, C-PAP desensitization and return for      C-PAP titration bringing a sleep medication if appropriate.      Clinton D. Annamaria Boots, M.D.  Diplomate, [employer] of Sleep Medicine  Electronically Signed     CDY/MEDQ  D:  01/30/2006 11:16:06  T:  01/30/2006 23:00:53  Job:  [id number]

## 2011-04-23 NOTE — Cardiovascular Report (Signed)
NAMEJOELLE, Victor Thompson                ACCOUNT NO.:  [id number]   MEDICAL RECORD NO.:  [id number]          PATIENT TYPE:  INP   LOCATION:  2039                         FACILITY:  [facility]   PHYSICIAN:  Allegra Lai. Terrence Dupont, M.D. DATE OF BIRTH:  1961/11/14   DATE OF PROCEDURE:  10/22/2005  DATE OF DISCHARGE:                              CARDIAC CATHETERIZATION   PROCEDURE:  Left cardiac cath with selective left and right coronary  angiography, LV graft via the right groin using Judkins technique.   INDICATIONS FOR PROCEDURE:  [name] is a 50 year old black male with past  medical history significant for coronary artery disease, history of non-Q-  wave MI approximately one year ago, status post PTCA stenting at [facility],  history of CHF, hypertension, hypercholesteremia, tobacco abuse, history of  cocaine abuse in the past. He came to the ER complaining of retrosternal  chest pain described as heaviness grade 7/10 radiating to left arm off and  on since yesterday. He went to Urgent Care yesterday and was discharged this  a.m. While going to work, he had similar chest pain. He took Zantac and  Mylanta without relief, so decided to come the ER. EKG done in the ER showed  normal sinus rhythm with LVH with strain pattern versus inferolateral wall  ischemia. No old records or EKG are available. The patient was noted to have  also minimally elevated troponin I. The patient was started on IV heparin  and nitrates with relief of chest pain.   PAST MEDICAL HISTORY:  As above.   PAST SURGICAL HISTORY:  None.   ALLERGIES:  Questionable allergy to LIDOCAINE.   MEDICATION AT HOME:  He is on Toprol XL, hydrochlorothiazide, nifedipine,  isosorbide, aspirin, lovastatin, and Singulair.   SOCIAL HISTORY:  He is single and has two children. Smoked one pack per day  for 15 years. No history of alcohol abuse. Used to smoke cocaine; last  smoked three years ago. 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   FAMILY HISTORY:  Father died at young age, cause not known. Mother is alive.  She is hypertensive. One sister is hypertensive.   On examination, his blood pressure was 184/122, pulse was 88. Repeat blood  pressure was 142/92 after IV nitrates, pulse was 78. The conjunctivae was  pink. Neck was supple. No JVD, no bruit. Lungs were clear to auscultation  without rhonchi or rales. Cardiovascular exam had S1 and S2 normal. There  was soft S4 gallop. Abdomen was soft. Bowel sounds present, nontender.  Extremities showed there was no clubbing, cyanosis or edema.   Discussed with the patient at length regarding left cath, possible PTCA,  stenting, its risks and benefits, i.e. death, stroke, need for emergency  CABG, risk of restenosis, local vascular complications, etc. and consented  for the procedure.   PROCEDURE:  After obtaining the informed consent, the patient was brought to  the cath lab and was placed on fluoroscopy table. The right groin was  prepped and draped in the usual fashion. 2% Xylocaine was used for local  anesthesia in the right groin. With the  help of thin-wall needle, a 6-French  arterial sheath was placed. The sheath was aspirated and flushed. Next, a 6-  French left Judkins catheter was advanced over the wire under fluoroscopic  guidance up to the ascending aorta. Wire was pulled out, the catheter was  aspirated and connected to the manifold. Catheter was further advanced and  engaged into left coronary ostium. Multiple views of the left system were  taken. Next, the catheter was disengaged and was pulled out over the wire  was replaced with a 6-French right Judkins catheter which was advanced over  the wire under fluoroscopic guidance up to the ascending aorta. Wire was  pulled out, the catheter was aspirated and connected to the manifold.  Catheter was further advanced and engaged into right coronary ostium.  Multiple views of the right system were taken. Next, the  catheter was  disengaged and was pulled out over the wire was replaced with 6-French  pigtail catheter which was advanced over the wire under fluoroscopic  guidance up to the ascending aorta. Wire was pulled out, the catheter was  aspirated and connected to the manifold. Catheter was further advanced  across aortic valve into the LV. LV pressures were recorded. Next, LV graft  was done in 30 degree RAO position. Post angiographic pressures were  recorded from LV and then pullback pressures were recorded from the aorta.  There was no gradient across aortic valve. Next, the pigtail catheter was  pulled down into the abdominal aorta. Aortography was done in PA position  with utilization of bilateral renal arteries. Next, the pigtail catheter was  pulled out. Sheaths over the wire. Sheaths were aspirated and flushed.   FINDINGS:  LV showed inferior wall hypokinesia, EF of 40-45% left main was  patent. LAD has 30-40% ostial stenosis. Diagonal-1 was very small diagonal-2  has 40-50% ostial stenosis. Left circumflex has 20-30% ostial stenosis and  20-25% proximal stenosis and then it tapers down in AV groove. OM-1 is very  small OM-2 is large which has 20-30% ostial stenosis which bifurcates into  superior and inferior branch which were patent. RCA has 15-20% mid-stenosis.  The aortogram showed no aortic aneurysm, bilateral renal arteries were  patent. The patient tolerated procedure well. There were no complications.  Arteriotomy was closed with AngioSeal without complications. The patient  tolerated procedure well and was transferred to recovery room in stable  condition.           ______________________________  Allegra Lai Terrence Dupont, M.D.     MNH/MEDQ  D:  10/22/2005  T:  10/23/2005  Job:  [id number]   cc:   Catheter Lab

## 2011-08-31 LAB — CBC
HCT: 47.3
Hemoglobin: 15.8
MCHC: 33.5
MCV: 93.8
Platelets: 215
RBC: 5.04
RDW: 15
WBC: 4.9

## 2011-08-31 LAB — POCT I-STAT, CHEM 8
BUN: 18
Calcium, Ion: 1.19
Chloride: 103
Creatinine, Ser: 1.7 — ABNORMAL HIGH
Glucose, Bld: 127 — ABNORMAL HIGH
HCT: 49
Hemoglobin: 16.7
Potassium: 3.3 — ABNORMAL LOW
Sodium: 142
TCO2: 29

## 2011-08-31 LAB — DIFFERENTIAL
Basophils Absolute: 0
Basophils Relative: 0
Eosinophils Absolute: 0.2
Eosinophils Relative: 3
Lymphocytes Relative: 28
Lymphs Abs: 1.4
Monocytes Absolute: 0.3
Monocytes Relative: 7
Neutro Abs: 3
Neutrophils Relative %: 62

## 2011-08-31 LAB — POCT CARDIAC MARKERS
CKMB, poc: 1.7
CKMB, poc: 2.3
Myoglobin, poc: 127
Myoglobin, poc: 178
Operator id: 146091
Operator id: 146091
Troponin i, poc: <0.05
Troponin i, poc: <0.05

## 2011-09-10 LAB — POCT CARDIAC MARKERS
CKMB, poc: 1.4 ng/mL (ref 1.0–8.0)
CKMB, poc: <1 ng/mL — ABNORMAL LOW (ref 1.0–8.0)
Myoglobin, poc: 230 ng/mL (ref 12–200)
Myoglobin, poc: 280 ng/mL (ref 12–200)
Troponin i, poc: <0.05 ng/mL (ref 0.00–0.09)
Troponin i, poc: <0.05 ng/mL (ref 0.00–0.09)

## 2011-09-10 LAB — DIFFERENTIAL
Basophils Absolute: 0 10*3/uL (ref 0.0–0.1)
Basophils Relative: 1 % (ref 0–1)
Eosinophils Absolute: 0.2 10*3/uL (ref 0.0–0.7)
Eosinophils Relative: 4 % (ref 0–5)
Lymphocytes Relative: 26 % (ref 12–46)
Lymphs Abs: 1.5 10*3/uL (ref 0.7–4.0)
Monocytes Absolute: 0.9 10*3/uL (ref 0.1–1.0)
Monocytes Relative: 15 % — ABNORMAL HIGH (ref 3–12)
Neutro Abs: 3.2 10*3/uL (ref 1.7–7.7)
Neutrophils Relative %: 55 % (ref 43–77)

## 2011-09-10 LAB — URINE CULTURE: Colony Count: 15000

## 2011-09-10 LAB — COMPREHENSIVE METABOLIC PANEL
ALT: 30 U/L (ref 0–53)
AST: 24 U/L (ref 0–37)
Albumin: 3.3 g/dL — ABNORMAL LOW (ref 3.5–5.2)
Alkaline Phosphatase: 102 U/L (ref 39–117)
BUN: 23 mg/dL (ref 6–23)
CO2: 29 mEq/L (ref 19–32)
Calcium: 9.1 mg/dL (ref 8.4–10.5)
Chloride: 101 mEq/L (ref 96–112)
Creatinine, Ser: 1.63 mg/dL — ABNORMAL HIGH (ref 0.4–1.5)
GFR calc Af Amer: 56 mL/min — ABNORMAL LOW (ref >60)
GFR calc non Af Amer: 46 mL/min — ABNORMAL LOW (ref >60)
Glucose, Bld: 100 mg/dL — ABNORMAL HIGH (ref 70–99)
Potassium: 3.4 mEq/L — ABNORMAL LOW (ref 3.5–5.1)
Sodium: 138 mEq/L (ref 135–145)
Total Bilirubin: 0.5 mg/dL (ref 0.3–1.2)
Total Protein: 7 g/dL (ref 6.0–8.3)

## 2011-09-10 LAB — CBC
HCT: 40.9 % (ref 39.0–52.0)
Hemoglobin: 13.4 g/dL (ref 13.0–17.0)
MCHC: 32.6 g/dL (ref 30.0–36.0)
MCV: 92.7 fL (ref 78.0–100.0)
Platelets: 273 10*3/uL (ref 150–400)
RBC: 4.42 MIL/uL (ref 4.22–5.81)
RDW: 13.9 % (ref 11.5–15.5)
WBC: 5.8 10*3/uL (ref 4.0–10.5)

## 2011-09-10 LAB — URINALYSIS, ROUTINE W REFLEX MICROSCOPIC
Bilirubin Urine: NEGATIVE
Glucose, UA: NEGATIVE mg/dL
Hgb urine dipstick: NEGATIVE
Ketones, ur: NEGATIVE mg/dL
Leukocytes, UA: NEGATIVE
Nitrite: NEGATIVE
Protein, ur: 100 mg/dL — AB
Specific Gravity, Urine: 1.024 (ref 1.005–1.030)
Urobilinogen, UA: 0.2 mg/dL (ref 0.0–1.0)
pH: 6 (ref 5.0–8.0)

## 2011-09-10 LAB — URINE MICROSCOPIC-ADD ON

## 2011-09-10 LAB — RAPID URINE DRUG SCREEN, HOSP PERFORMED
Amphetamines: NOT DETECTED
Barbiturates: NOT DETECTED
Benzodiazepines: NOT DETECTED
Cocaine: NOT DETECTED
Opiates: POSITIVE — AB
Tetrahydrocannabinol: NOT DETECTED

## 2011-09-10 LAB — PROTIME-INR
INR: 1 (ref 0.00–1.49)
Prothrombin Time: 13.1 seconds (ref 11.6–15.2)

## 2011-09-10 LAB — POCT I-STAT, CHEM 8
BUN: 25 mg/dL — ABNORMAL HIGH (ref 6–23)
Calcium, Ion: 1.15 mmol/L (ref 1.12–1.32)
Chloride: 101 mEq/L (ref 96–112)
Creatinine, Ser: 1.9 mg/dL — ABNORMAL HIGH (ref 0.4–1.5)
Glucose, Bld: 96 mg/dL (ref 70–99)
HCT: 42 % (ref 39.0–52.0)
Hemoglobin: 14.3 g/dL (ref 13.0–17.0)
Potassium: 3.4 mEq/L — ABNORMAL LOW (ref 3.5–5.1)
Sodium: 140 mEq/L (ref 135–145)
TCO2: 28 mmol/L (ref 0–100)

## 2011-09-10 LAB — LIPASE, BLOOD: Lipase: 28 U/L (ref 11–59)

## 2013-12-06 DIAGNOSIS — I469 Cardiac arrest, cause unspecified: Secondary | ICD-10-CM

## 2013-12-06 HISTORY — DX: Cardiac arrest, cause unspecified: I46.9

## 2013-12-06 HISTORY — PX: IMPLANTABLE CARDIOVERTER DEFIBRILLATOR IMPLANT: SHX5860

## 2014-12-18 ENCOUNTER — Encounter (HOSPITAL_COMMUNITY): Payer: Self-pay | Admitting: Emergency Medicine

## 2014-12-18 ENCOUNTER — Emergency Department (INDEPENDENT_AMBULATORY_CARE_PROVIDER_SITE_OTHER)
Admission: EM | Admit: 2014-12-18 | Discharge: 2014-12-18 | Disposition: A | Discharge status: Home / Self Care | Payer: Self-pay | Source: Home / Self Care | Attending: Emergency Medicine | Admitting: Emergency Medicine

## 2014-12-18 DIAGNOSIS — T783XXD Angioneurotic edema, subsequent encounter: Secondary | ICD-10-CM

## 2014-12-18 HISTORY — DX: Essential (primary) hypertension: I10

## 2014-12-18 HISTORY — DX: Atherosclerotic heart disease of native coronary artery without angina pectoris: I25.10

## 2014-12-18 MED ORDER — RANITIDINE HCL 150 MG PO TABS: 150.0000 mg | ORAL_TABLET | Freq: Two times a day (BID) | ORAL | Status: DC

## 2014-12-18 MED ORDER — DIPHENHYDRAMINE HCL 50 MG/ML IJ SOLN
50.0000 mg | Freq: Once | INTRAMUSCULAR | Status: AC
  Administered 2014-12-18: 50 mg via INTRAMUSCULAR

## 2014-12-18 MED ORDER — PREDNISONE 20 MG PO TABS
ORAL_TABLET | ORAL | Status: AC
  Filled 2014-12-18: qty 3

## 2014-12-18 MED ORDER — PREDNISONE 20 MG PO TABS
60.0000 mg | ORAL_TABLET | Freq: Once | ORAL | Status: AC
  Administered 2014-12-18: 60 mg via ORAL

## 2014-12-18 MED ORDER — DIPHENHYDRAMINE HCL 50 MG/ML IJ SOLN
INTRAMUSCULAR | Status: AC
  Filled 2014-12-18: qty 1

## 2014-12-18 MED ORDER — FAMOTIDINE 20 MG PO TABS
10.0000 mg | ORAL_TABLET | Freq: Once | ORAL | Status: AC
  Administered 2014-12-18: 10 mg via ORAL

## 2014-12-18 MED ORDER — FAMOTIDINE 20 MG PO TABS
ORAL_TABLET | ORAL | Status: AC
  Filled 2014-12-18: qty 1

## 2014-12-18 MED ORDER — PREDNISONE 20 MG PO TABS: 20.0000 mg | ORAL_TABLET | Freq: Two times a day (BID) | ORAL | Status: DC

## 2014-12-18 MED ORDER — EPINEPHRINE 0.3 MG/0.3ML IJ SOAJ: 0.3000 mg | Freq: Once | INTRAMUSCULAR | Status: DC

## 2014-12-18 NOTE — ED Provider Notes (Signed)
Chief Complaint    Allergic Reaction   History of Present Illness      Victor Thompson is a 54 year old male who has had a 4 year history of recurring episodes of angioedema. These are usually characterized by swelling of his tongue, tingling in his mouth, itching, and swelling of his hands and feet. He's never been able to discover any particular triggers for this, despite allergy evaluation. He has an EpiPen that he uses when he gets an attack. Current attack began today around 4:15 PM. His mouth felt tingly in his tongue felt like it was swelling. He denies any difficulty breathing or wheezing. He had no coughing or swelling of his face, hives, or itching of the skin other than the fingers. Around 4:30 he took a dose of EpiPen and now feels better. He has minimal swelling of his tongue, tingling in his mouth, and itching of his right index finger. He denies any new foods or medications, bites or stings, or inhalant exposure.  Review of Systems   Other than as noted above, the patient denies any of the following symptoms: Systemic:  No fever or chills. ENT:  No nasal congestion, rhinorrhea, sore throat, swelling of lips, tongue or throat. Resp:  No cough, wheezing, or shortness of breath.  [place]    Past medical history, family history, social history, meds, and allergies were reviewed. The patient just got out of prison. He has allergies to penicillin, lisinopril, and fish. He is on a number of medications including amlodipine, aspirin, Lipitor, carvedilol, hydralazine, hydrochlorothiazide, isosorbide dinitrate, Dulera, Singulair, and verapamil. He has a history of coronary artery disease and hypertension.  Physical Exam     Vital signs:  BP 179/101 mmHg  Pulse 93  Temp(Src) 98.7 F (37.1 C) (Oral)  Resp 18  SpO2 96% Gen:  Alert, oriented, in no distress. ENT:  Pharynx clear, no intraoral lesions, moist mucous membranes. No swelling of the lips, tongue, or throat. Lungs:  Clear to  auscultation. Skin:  Skin was completely clear, no hives, no swelling of the hands or feet.  Course in [facility]     The following meds were given:  Medications  predniSONE (DELTASONE) tablet 60 mg (60 mg Oral Given 12/18/14 1818)  diphenhydrAMINE (BENADRYL) injection 50 mg (50 mg Intramuscular Given 12/18/14 1811)  famotidine (PEPCID) tablet 10 mg (10 mg Oral Given 12/18/14 1810)   The patient was observed for 15 minutes after getting these medications and continued to do well without any progression of his allergy symptoms.  Assessment    The encounter diagnosis was Angioedema, subsequent encounter.  Plan     1.  Meds:  The following meds were prescribed:   New Prescriptions   EPINEPHRINE (EPIPEN 2-PAK) 0.3 MG/0.3 ML IJ SOAJ INJECTION    Inject 0.3 mLs (0.3 mg total) into the muscle once.   PREDNISONE (DELTASONE) 20 MG TABLET    Take 1 tablet (20 mg total) by mouth 2 (two) times daily.   RANITIDINE (ZANTAC) 150 MG TABLET    Take 1 tablet (150 mg total) by mouth 2 (two) times daily.    2.  Patient Education/Counseling:  The patient was given appropriate handouts, self care instructions, and instructed in symptomatic relief.  He was instructed to take Benadryl 25 mg every 4 hours while awake for the next 48 hours.  3.  Follow up:  The patient was told to follow up here if no better in 3 to 4 days, or sooner if  becoming worse in any way, and given some red flag symptoms such as worsening rash, fever, or difficulty breathing which would prompt immediate return.  Follow up with his primary care physician if needed. If he has any symptoms of difficulty breathing, he's to go directly to the emergency room by ambulance.    Harden Mo, MD 12/18/14 [phone]

## 2014-12-18 NOTE — ED Notes (Signed)
States he gave himself shot of epinephrine at 4;30p for onset allergic reaction , exhibited by fingers tingling, feet itching, tongue swelling. NAD at present, Dr Jake Michaelis advised

## 2014-12-18 NOTE — Discharge Instructions (Signed)
Angioedema °Angioedema is a sudden swelling of tissues, often of the skin. It can occur on the face or genitals or in the abdomen or other body parts. The swelling usually develops over a short period and gets better in 24 to 48 hours. It often begins during the night and is found when the person wakes up. The person may also get red, itchy patches of skin (hives). Angioedema can be dangerous if it involves swelling of the air passages.  °Depending on the cause, episodes of angioedema may only happen once, come back in unpredictable patterns, or repeat for several years and then gradually fade away.  °CAUSES  °Angioedema can be caused by an allergic reaction to various triggers. It can also result from nonallergic causes, including reactions to drugs, immune system disorders, viral infections, or an abnormal gene that is passed to you from your parents (hereditary). For some people with angioedema, the cause is unknown.  °Some things that can trigger angioedema include:  °· Foods.   °· Medicines, such as ACE inhibitors, ARBs, nonsteroidal anti-inflammatory agents, or estrogen.   °· Latex.   °· Animal saliva.   °· Insect stings.   °· Dyes used in X-rays.   °· Mild injury.   °· Dental work. °· Surgery. °· Stress.   °· Sudden changes in temperature.   °· Exercise. °SIGNS AND SYMPTOMS  °· Swelling of the skin. °· Hives. If these are present, there is also intense itching. °· Redness in the affected area.   °· Pain in the affected area. °· Swollen lips or tongue. °· Breathing problems. This may happen if the air passages swell. °· Wheezing. °If internal organs are involved, there may be:  °· Nausea.   °· Abdominal pain.   °· Vomiting.   °· Difficulty swallowing.   °· Difficulty passing urine. °DIAGNOSIS  °· Your health care provider will examine the affected area and take a medical and family history. °· Various tests may be done to help determine the cause. Tests may include: °¨ Allergy skin tests to see if the problem  is an allergic reaction.   °¨ Blood tests to check for hereditary angioedema.   °¨ Tests to check for underlying diseases that could cause the condition.   °· A review of your medicines, including over-the-counter medicines, may be done. °TREATMENT  °Treatment will depend on the cause of the angioedema. Possible treatments include:  °· Removal of anything that triggered the condition (such as stopping certain medicines).   °· Medicines to treat symptoms or prevent attacks. Medicines given may include:   °¨ Antihistamines.   °¨ Epinephrine injection.   °¨ Steroids.   °· Hospitalization may be required for severe attacks. If the air passages are affected, it can be an emergency. Tubes may need to be placed to keep the airway open. °HOME CARE INSTRUCTIONS  °· Take all medicines as directed by your health care provider. °· If you were given medicines for emergency allergy treatment, always carry them with you. °· Wear a medical bracelet as directed by your health care provider.   °· Avoid known triggers. °SEEK MEDICAL CARE IF:  °· You have repeat attacks of angioedema.   °· Your attacks are more frequent or more severe despite preventive measures.   °· You have hereditary angioedema and are considering having children. It is important to discuss with your health care provider the risks of passing the condition on to your children. °SEEK IMMEDIATE MEDICAL CARE IF:  °· You have severe swelling of the mouth, tongue, or lips. °· You have difficulty breathing.   °· You have difficulty swallowing.   °· You faint. °MAKE   SURE YOU:  Understand these instructions.  Will watch your condition.  Will get help right away if you are not doing well or get worse. Document Released: 01/31/2002 Document Revised: 04/08/2014 Document Reviewed: 07/16/2013 [facility] Patient Information 2015 [place], [place]. This information is not intended to replace advice given to you by your health care provider. Make sure you discuss any questions  you have with your health care provider.   Take Benadryl 25 mg every 4 hours while awake for next 48 hours.

## 2015-01-14 ENCOUNTER — Emergency Department (INDEPENDENT_AMBULATORY_CARE_PROVIDER_SITE_OTHER)
Admission: EM | Admit: 2015-01-14 | Discharge: 2015-01-14 | Disposition: A | Discharge status: Home / Self Care | Payer: No Typology Code available for payment source | Source: Home / Self Care | Attending: Family Medicine | Admitting: Family Medicine

## 2015-01-14 ENCOUNTER — Emergency Department (INDEPENDENT_AMBULATORY_CARE_PROVIDER_SITE_OTHER): Payer: No Typology Code available for payment source

## 2015-01-14 ENCOUNTER — Encounter (HOSPITAL_COMMUNITY): Payer: Self-pay | Admitting: Emergency Medicine

## 2015-01-14 DIAGNOSIS — R05 Cough: Secondary | ICD-10-CM

## 2015-01-14 DIAGNOSIS — J9801 Acute bronchospasm: Secondary | ICD-10-CM

## 2015-01-14 DIAGNOSIS — J069 Acute upper respiratory infection, unspecified: Secondary | ICD-10-CM

## 2015-01-14 DIAGNOSIS — R059 Cough, unspecified: Secondary | ICD-10-CM

## 2015-01-14 DIAGNOSIS — R0600 Dyspnea, unspecified: Secondary | ICD-10-CM

## 2015-01-14 DIAGNOSIS — H65193 Other acute nonsuppurative otitis media, bilateral: Secondary | ICD-10-CM

## 2015-01-14 MED ORDER — CLINDAMYCIN HCL 300 MG PO CAPS: 300.0000 mg | ORAL_CAPSULE | Freq: Three times a day (TID) | ORAL | Status: DC

## 2015-01-14 MED ORDER — TRIAMCINOLONE ACETONIDE 40 MG/ML IJ SUSP
INTRAMUSCULAR | Status: AC
  Filled 2015-01-14: qty 1

## 2015-01-14 MED ORDER — ALBUTEROL SULFATE (2.5 MG/3ML) 0.083% IN NEBU
2.5000 mg | INHALATION_SOLUTION | Freq: Once | RESPIRATORY_TRACT | Status: AC
  Administered 2015-01-14: 2.5 mg via RESPIRATORY_TRACT

## 2015-01-14 MED ORDER — TRAMADOL HCL 50 MG PO TABS: 50.0000 mg | ORAL_TABLET | Freq: Four times a day (QID) | ORAL | Status: DC | PRN

## 2015-01-14 MED ORDER — ALBUTEROL SULFATE HFA 108 (90 BASE) MCG/ACT IN AERS: 2.0000 | INHALATION_SPRAY | RESPIRATORY_TRACT | Status: DC | PRN

## 2015-01-14 MED ORDER — PREDNISONE 20 MG PO TABS
ORAL_TABLET | ORAL | Status: DC
Start: 2015-01-14 — End: 2015-04-30

## 2015-01-14 MED ORDER — IPRATROPIUM-ALBUTEROL 0.5-2.5 (3) MG/3ML IN SOLN
RESPIRATORY_TRACT | Status: AC
  Filled 2015-01-14: qty 3

## 2015-01-14 MED ORDER — IPRATROPIUM-ALBUTEROL 0.5-2.5 (3) MG/3ML IN SOLN
3.0000 mL | Freq: Once | RESPIRATORY_TRACT | Status: AC
  Administered 2015-01-14: 3 mL via RESPIRATORY_TRACT

## 2015-01-14 MED ORDER — ALBUTEROL SULFATE (2.5 MG/3ML) 0.083% IN NEBU
INHALATION_SOLUTION | RESPIRATORY_TRACT | Status: AC
  Filled 2015-01-14: qty 3

## 2015-01-14 MED ORDER — TRIAMCINOLONE ACETONIDE 40 MG/ML IJ SUSP
60.0000 mg | Freq: Once | INTRAMUSCULAR | Status: AC
  Administered 2015-01-14: 60 mg via INTRAMUSCULAR

## 2015-01-14 NOTE — Discharge Instructions (Signed)
Bronchospasm A bronchospasm is a spasm or tightening of the airways going into the lungs. During a bronchospasm breathing becomes more difficult because the airways get smaller. When this happens there can be coughing, a whistling sound when breathing (wheezing), and difficulty breathing. Bronchospasm is often associated with asthma, but not all patients who experience a bronchospasm have asthma. CAUSES  A bronchospasm is caused by inflammation or irritation of the airways. The inflammation or irritation may be triggered by:   Allergies (such as to animals, pollen, food, or mold). Allergens that cause bronchospasm may cause wheezing immediately after exposure or many hours later.   Infection. Viral infections are believed to be the most common cause of bronchospasm.   Exercise.   Irritants (such as pollution, cigarette smoke, strong odors, aerosol sprays, and paint fumes).   Weather changes. Winds increase molds and pollens in the air. Rain refreshes the air by washing irritants out. Cold air may cause inflammation.   Stress and emotional upset.  SIGNS AND SYMPTOMS   Wheezing.   Excessive nighttime coughing.   Frequent or severe coughing with a simple cold.   Chest tightness.   Shortness of breath.  DIAGNOSIS  Bronchospasm is usually diagnosed through a history and physical exam. Tests, such as chest X-rays, are sometimes done to look for other conditions. TREATMENT   Inhaled medicines can be given to open up your airways and help you breathe. The medicines can be given using either an inhaler or a nebulizer machine.  Corticosteroid medicines may be given for severe bronchospasm, usually when it is associated with asthma. HOME CARE INSTRUCTIONS   Always have a plan prepared for seeking medical care. Know when to call your health care provider and local emergency services (911 in the [place] Know where you can access local emergency care.  Only take medicines as  directed by your health care provider.  If you were prescribed an inhaler or nebulizer machine, ask your health care provider to explain how to use it correctly. Always use a spacer with your inhaler if you were given one.  It is necessary to remain calm during an attack. Try to relax and breathe more slowly.  Control your home environment in the following ways:   Change your heating and air conditioning filter at least once a month.   Limit your use of fireplaces and wood stoves.  Do not smoke and do not allow smoking in your home.   Avoid exposure to perfumes and fragrances.   Get rid of pests (such as roaches and mice) and their droppings.   Throw away plants if you see mold on them.   Keep your house clean and dust free.   Replace carpet with wood, tile, or vinyl flooring. Carpet can trap dander and dust.   Use allergy-proof pillows, mattress covers, and box spring covers.   Wash bed sheets and blankets every week in hot water and dry them in a dryer.   Use blankets that are made of polyester or cotton.   Wash hands frequently. SEEK MEDICAL CARE IF:   You have muscle aches.   You have chest pain.   The sputum changes from clear or white to yellow, green, gray, or bloody.   The sputum you cough up gets thicker.   There are problems that may be related to the medicine you are given, such as a rash, itching, swelling, or trouble breathing.  SEEK IMMEDIATE MEDICAL CARE IF:   You have worsening wheezing and coughing even  after taking your prescribed medicines.   You have increased difficulty breathing.   You develop severe chest pain. MAKE SURE YOU:   Understand these instructions.  Will watch your condition.  Will get help right away if you are not doing well or get worse. Document Released: 11/25/2003 Document Revised: 11/27/2013 Document Reviewed: 05/14/2013 [facility] Patient Information 2015 [place], [place]. This information is not  intended to replace advice given to you by your health care provider. Make sure you discuss any questions you have with your health care provider.  How to Use an Inhaler Using your inhaler correctly is very important. Good technique will make sure that the medicine reaches your lungs.  HOW TO USE AN INHALER:  Take the cap off the inhaler.  If this is the first time using your inhaler, you need to prime it. Shake the inhaler for 5 seconds. Release four puffs into the air, away from your face. Ask your doctor for help if you have questions.  Shake the inhaler for 5 seconds.  Turn the inhaler so the bottle is above the mouthpiece.  Put your pointer finger on top of the bottle. Your thumb holds the bottom of the inhaler.  Open your mouth.  Either hold the inhaler away from your mouth (the width of 2 fingers) or place your lips tightly around the mouthpiece. Ask your doctor which way to use your inhaler.  Breathe out as much air as possible.  Breathe in and push down on the bottle 1 time to release the medicine. You will feel the medicine go in your mouth and throat.  Continue to take a deep breath in very slowly. Try to fill your lungs.  After you have breathed in completely, hold your breath for 10 seconds. This will help the medicine to settle in your lungs. If you cannot hold your breath for 10 seconds, hold it for as long as you can before you breathe out.  Breathe out slowly, through pursed lips. Whistling is an example of pursed lips.  If your doctor has told you to take more than 1 puff, wait at least 15-30 seconds between puffs. This will help you get the best results from your medicine. Do not use the inhaler more than your doctor tells you to.  Put the cap back on the inhaler.  Follow the directions from your doctor or from the inhaler package about cleaning the inhaler. If you use more than one inhaler, ask your doctor which inhalers to use and what order to use them in. Ask  your doctor to help you figure out when you will need to refill your inhaler.  If you use a steroid inhaler, always rinse your mouth with water after your last puff, gargle and spit out the water. Do not swallow the water. GET HELP IF:  The inhaler medicine only partially helps to stop wheezing or shortness of breath.  You are having trouble using your inhaler.  You have some increase in thick spit (phlegm). GET HELP RIGHT AWAY IF:  The inhaler medicine does not help your wheezing or shortness of breath or you have tightness in your chest.  You have dizziness, headaches, or fast heart rate.  You have chills, fever, or night sweats.  You have a large increase of thick spit, or your thick spit is bloody. MAKE SURE YOU:   Understand these instructions.  Will watch your condition.  Will get help right away if you are not doing well or get worse. Document  Released: 08/31/2008 Document Revised: 09/12/2013 Document Reviewed: 06/21/2013 [facility] Patient Information 2015 [place], [place]. This information is not intended to replace advice given to you by your health care provider. Make sure you discuss any questions you have with your health care provider.  Otitis Media Otitis media is redness, soreness, and inflammation of the middle ear. Otitis media may be caused by allergies or, most commonly, by infection. Often it occurs as a complication of the common cold. SIGNS AND SYMPTOMS Symptoms of otitis media may include:  Earache.  Fever.  Ringing in your ear.  Headache.  Leakage of fluid from the ear. DIAGNOSIS To diagnose otitis media, your health care provider will examine your ear with an otoscope. This is an instrument that allows your health care provider to see into your ear in order to examine your eardrum. Your health care provider also will ask you questions about your symptoms. TREATMENT  Typically, otitis media resolves on its own within 3-5 days. Your health care  provider may prescribe medicine to ease your symptoms of pain. If otitis media does not resolve within 5 days or is recurrent, your health care provider may prescribe antibiotic medicines if he or she suspects that a bacterial infection is the cause. HOME CARE INSTRUCTIONS   If you were prescribed an antibiotic medicine, finish it all even if you start to feel better.  Take medicines only as directed by your health care provider.  Keep all follow-up visits as directed by your health care provider. SEEK MEDICAL CARE IF:  You have otitis media only in one ear, or bleeding from your nose, or both.  You notice a lump on your neck.  You are not getting better in 3-5 days.  You feel worse instead of better. SEEK IMMEDIATE MEDICAL CARE IF:   You have pain that is not controlled with medicine.  You have swelling, redness, or pain around your ear or stiffness in your neck.  You notice that part of your face is paralyzed.  You notice that the bone behind your ear (mastoid) is tender when you touch it. MAKE SURE YOU:   Understand these instructions.  Will watch your condition.  Will get help right away if you are not doing well or get worse. Document Released: 08/27/2004 Document Revised: 04/08/2014 Document Reviewed: 06/19/2013 [facility] Patient Information 2015 [place], [place]. This information is not intended to replace advice given to you by your health care provider. Make sure you discuss any questions you have with your health care provider.  Upper Respiratory Infection, Adult Recommend Dayquil and allegra or Claritin Nasal saline nasal spray Albuterol for cough and wheeze An upper respiratory infection (URI) is also known as the common cold. It is often caused by a type of germ (virus). Colds are easily spread (contagious). You can pass it to others by kissing, coughing, sneezing, or drinking out of the same glass. Usually, you get better in 1 or 2 weeks.  HOME CARE   Only take  medicine as told by your doctor.  Use a warm mist humidifier or breathe in steam from a hot shower.  Drink enough water and fluids to keep your pee (urine) clear or pale yellow.  Get plenty of rest.  Return to work when your temperature is back to normal or as told by your doctor. You may use a face mask and wash your hands to stop your cold from spreading. GET HELP RIGHT AWAY IF:   After the first few days, you feel you  are getting worse.  You have questions about your medicine.  You have chills, shortness of breath, or [place] or red spit (mucus).  You have yellow or [place] snot (nasal discharge) or pain in the face, especially when you bend forward.  You have a fever, puffy (swollen) neck, pain when you swallow, or white spots in the back of your throat.  You have a bad headache, ear pain, sinus pain, or chest pain.  You have a high-pitched whistling sound when you breathe in and out (wheezing).  You have a lasting cough or cough up blood.  You have sore muscles or a stiff neck. MAKE SURE YOU:   Understand these instructions.  Will watch your condition.  Will get help right away if you are not doing well or get worse. Document Released: 05/10/2008 Document Revised: 02/14/2012 Document Reviewed: 02/27/2014 [facility] Patient Information 2015 [place], [place]. This information is not intended to replace advice given to you by your health care provider. Make sure you discuss any questions you have with your health care provider.

## 2015-01-14 NOTE — ED Provider Notes (Signed)
CSN: [id number]     Arrival date & time 01/14/15  1155 History   First MD Initiated Contact with Patient 01/14/15 1312     Chief Complaint  Patient presents with  . Facial Pain   (Consider location/radiation/quality/duration/timing/severity/associated sxs/prior Treatment) HPI Comments: 54 year old male recently discharged from the [facility] is complaining of a two-week history of right ear feeling clogged, upper rest toward congestion, right-sided headache that is worse with coughing, nasal congestion, shortness of breath. Denies fever. Has been taking various OTC sinus medications without relief.   Past Medical History  Diagnosis Date  . Coronary artery disease   . Hypertension    History reviewed. No pertinent past surgical history. History reviewed. No pertinent family history. History  Substance Use Topics  . Smoking status: Current Every Day Smoker  . Smokeless tobacco: Not on file  . Alcohol Use: No    Review of Systems  Constitutional: Positive for activity change. Negative for fever, diaphoresis and fatigue.  HENT: Positive for congestion, ear pain, postnasal drip and rhinorrhea. Negative for facial swelling, sore throat and trouble swallowing.   Eyes: Negative for pain, discharge and redness.  Respiratory: Positive for cough and shortness of breath. Negative for chest tightness.   Cardiovascular: Negative.   Gastrointestinal: Negative.   Musculoskeletal: Negative.  Negative for neck pain and neck stiffness.  Skin: Negative.   Neurological: Negative.     Allergies  Lidocaine hcl; Lisinopril; and Penicillins  Home Medications   Prior to Admission medications   Medication Sig Start Date End Date Taking? Authorizing Provider  aspirin EC 81 MG tablet Take 81 mg by mouth daily.   Yes Historical Provider, MD  atorvastatin (LIPITOR) 40 MG tablet Take 40 mg by mouth daily.   Yes Historical Provider, MD  carvedilol (COREG) 25 MG tablet Take 25 mg by mouth 2 (two) times  daily with a meal.   Yes Historical Provider, MD  hydrALAZINE (APRESOLINE) 25 MG tablet Take 25 mg by mouth 3 (three) times daily.   Yes Historical Provider, MD  hydrochlorothiazide (HYDRODIURIL) 25 MG tablet Take 25 mg by mouth daily.   Yes Historical Provider, MD  isosorbide dinitrate (ISORDIL) 20 MG tablet Take 20 mg by mouth 3 (three) times daily.   Yes Historical Provider, MD  montelukast (SINGULAIR) 10 MG tablet Take 10 mg by mouth at bedtime.   Yes Historical Provider, MD  albuterol (PROVENTIL HFA;VENTOLIN HFA) 108 (90 BASE) MCG/ACT inhaler Inhale 2 puffs into the lungs every 4 (four) hours as needed for wheezing or shortness of breath. 01/14/15   Janne Napoleon, NP  amLODipine (NORVASC) 10 MG tablet Take 10 mg by mouth daily.    Historical Provider, MD  clindamycin (CLEOCIN) 300 MG capsule Take 1 capsule (300 mg total) by mouth 3 (three) times daily. 01/14/15   Janne Napoleon, NP  EPINEPHrine (EPIPEN 2-PAK) 0.3 mg/0.3 mL IJ SOAJ injection Inject 0.3 mLs (0.3 mg total) into the muscle once. 12/18/14   Harden Mo, MD  EPINEPHrine 0.3 mL in balanced salts 500 mL Irrigate with as directed once.    Historical Provider, MD  mometasone-formoterol (DULERA) 100-5 MCG/ACT AERO Inhale 2 puffs into the lungs 2 (two) times daily.    Historical Provider, MD  predniSONE (DELTASONE) 20 MG tablet Take 3 tabs po on first day, 2 tabs second day, 2 tabs third day, 1 tab fourth day, 1 tab 5th day. Take with food. 01/14/15   Janne Napoleon, NP  ranitidine (ZANTAC) 150 MG tablet Take 1  tablet (150 mg total) by mouth 2 (two) times daily. 12/18/14   Harden Mo, MD  traMADol (ULTRAM) 50 MG tablet Take 1 tablet (50 mg total) by mouth every 6 (six) hours as needed. For pain 01/14/15   Janne Napoleon, NP  verapamil (CALAN) 120 MG tablet Take 120 mg by mouth 3 (three) times daily.    Historical Provider, MD   BP 175/103 mmHg  Pulse 87  Temp(Src) 97.5 F (36.4 C) (Oral)  Resp 16  SpO2 90% Physical Exam  Constitutional: He is oriented  to person, place, and time. He appears well-developed and well-nourished. No distress.  HENT:  Mouth/Throat: No oropharyngeal exudate.  Bilateral TMs are erythematous. Oropharynx with copious amount of clear frothy PND. No erythema  Eyes: Conjunctivae and EOM are normal.  Neck: Normal range of motion. Neck supple.  Cardiovascular: Normal rate and regular rhythm.   Pulmonary/Chest: Effort normal. No respiratory distress. He has wheezes.  Bilateral expiratory wheezes. Prolonged expiratory phase  Musculoskeletal: Normal range of motion. He exhibits no edema.  Lymphadenopathy:    He has no cervical adenopathy.  Neurological: He is alert and oriented to person, place, and time.  Skin: Skin is warm and dry. No rash noted.  Psychiatric: He has a normal mood and affect.  Nursing note and vitals reviewed.   ED Course  Procedures (including critical care time) Labs Review Labs Reviewed - No data to display  Imaging Review Dg Chest 2 View  01/14/2015   CLINICAL DATA:  Right-sided pain for 2 weeks.  Difficulty breathing  EXAM: CHEST  2 VIEW  COMPARISON:  March 14, 2008  FINDINGS: Defibrillator tip overlies the sternomanubrial junction anteriorly from a leftward approach. Lungs are clear. Heart size and pulmonary vascularity are normal. No adenopathy. There is an old left posterior eighth rib fracture. There is degenerative change in the thoracic spine.  IMPRESSION: No edema or consolidation.   Electronically Signed   By: Lowella Grip III M.D.   On: 01/14/2015 14:09     MDM   1. URI (upper respiratory infection)   2. Dyspnea   3. Cough   4. Bronchospasm   5. Other acute nonsuppurative otitis media of both ears     Post DuoNeb patient's lungs are clear. There is no more cough. Expiratory phase is equal to expiratory. Patient states he feels like his breathing easier. Kenalog 60 mg IM administered We will discharge with prednisone taper dose Recommend Dayquil and allegra or  Claritin Nasal saline nasal spray Albuterol for cough and wheeze Clindamycin Albuterol HFA Tramadol 50 mg #15     Janne Napoleon, NP 01/14/15 1446

## 2015-01-14 NOTE — ED Notes (Signed)
C/o  Sinus pressure and pain.   Clogged right ear.  Symptoms present x 3 wks.  Denies fever, n/v/d

## 2015-04-30 ENCOUNTER — Emergency Department (HOSPITAL_COMMUNITY)
Admission: EM | Admit: 2015-04-30 | Discharge: 2015-04-30 | Disposition: A | Discharge status: Home / Self Care | Payer: No Typology Code available for payment source | Attending: Emergency Medicine | Admitting: Emergency Medicine

## 2015-04-30 ENCOUNTER — Encounter (HOSPITAL_COMMUNITY): Payer: Self-pay | Admitting: Emergency Medicine

## 2015-04-30 DIAGNOSIS — Z720 Tobacco use: Secondary | ICD-10-CM | POA: Insufficient documentation

## 2015-04-30 DIAGNOSIS — I10 Essential (primary) hypertension: Secondary | ICD-10-CM | POA: Insufficient documentation

## 2015-04-30 DIAGNOSIS — Y9389 Activity, other specified: Secondary | ICD-10-CM | POA: Insufficient documentation

## 2015-04-30 DIAGNOSIS — R0600 Dyspnea, unspecified: Secondary | ICD-10-CM | POA: Insufficient documentation

## 2015-04-30 DIAGNOSIS — T783XXA Angioneurotic edema, initial encounter: Secondary | ICD-10-CM

## 2015-04-30 DIAGNOSIS — X58XXXA Exposure to other specified factors, initial encounter: Secondary | ICD-10-CM | POA: Insufficient documentation

## 2015-04-30 DIAGNOSIS — I2510 Atherosclerotic heart disease of native coronary artery without angina pectoris: Secondary | ICD-10-CM | POA: Insufficient documentation

## 2015-04-30 DIAGNOSIS — Y9289 Other specified places as the place of occurrence of the external cause: Secondary | ICD-10-CM | POA: Insufficient documentation

## 2015-04-30 DIAGNOSIS — Z79899 Other long term (current) drug therapy: Secondary | ICD-10-CM | POA: Insufficient documentation

## 2015-04-30 DIAGNOSIS — Y998 Other external cause status: Secondary | ICD-10-CM | POA: Insufficient documentation

## 2015-04-30 DIAGNOSIS — Z880 Allergy status to penicillin: Secondary | ICD-10-CM | POA: Insufficient documentation

## 2015-04-30 DIAGNOSIS — Z7982 Long term (current) use of aspirin: Secondary | ICD-10-CM | POA: Insufficient documentation

## 2015-04-30 DIAGNOSIS — R220 Localized swelling, mass and lump, head: Secondary | ICD-10-CM

## 2015-04-30 DIAGNOSIS — R1310 Dysphagia, unspecified: Secondary | ICD-10-CM | POA: Insufficient documentation

## 2015-04-30 LAB — CBC WITH DIFFERENTIAL/PLATELET
Basophils Absolute: 0 10*3/uL (ref 0.0–0.1)
Basophils Relative: 0 % (ref 0–1)
Eosinophils Absolute: 0.2 10*3/uL (ref 0.0–0.7)
Eosinophils Relative: 5 % (ref 0–5)
HCT: 43.5 % (ref 39.0–52.0)
Hemoglobin: 14.5 g/dL (ref 13.0–17.0)
Lymphocytes Relative: 24 % (ref 12–46)
Lymphs Abs: 1.1 10*3/uL (ref 0.7–4.0)
MCH: 29.3 pg (ref 26.0–34.0)
MCHC: 33.3 g/dL (ref 30.0–36.0)
MCV: 87.9 fL (ref 78.0–100.0)
Monocytes Absolute: 0.4 10*3/uL (ref 0.1–1.0)
Monocytes Relative: 10 % (ref 3–12)
Neutro Abs: 2.8 10*3/uL (ref 1.7–7.7)
Neutrophils Relative %: 61 % (ref 43–77)
Platelets: 213 10*3/uL (ref 150–400)
RBC: 4.95 MIL/uL (ref 4.22–5.81)
RDW: 15.9 % — ABNORMAL HIGH (ref 11.5–15.5)
WBC: 4.6 10*3/uL (ref 4.0–10.5)

## 2015-04-30 LAB — BASIC METABOLIC PANEL
Anion gap: 9 (ref 5–15)
BUN: 18 mg/dL (ref 6–20)
CO2: 28 mmol/L (ref 22–32)
Calcium: 8.7 mg/dL — ABNORMAL LOW (ref 8.9–10.3)
Chloride: 104 mmol/L (ref 101–111)
Creatinine, Ser: 1.81 mg/dL — ABNORMAL HIGH (ref 0.61–1.24)
GFR calc Af Amer: 48 mL/min — ABNORMAL LOW (ref >60)
GFR calc non Af Amer: 42 mL/min — ABNORMAL LOW (ref >60)
Glucose, Bld: 125 mg/dL — ABNORMAL HIGH (ref 65–99)
Potassium: 3.1 mmol/L — ABNORMAL LOW (ref 3.5–5.1)
Sodium: 141 mmol/L (ref 135–145)

## 2015-04-30 MED ORDER — DIPHENHYDRAMINE HCL 50 MG/ML IJ SOLN
50.0000 mg | Freq: Once | INTRAMUSCULAR | Status: AC
  Administered 2015-04-30: 50 mg via INTRAVENOUS
  Filled 2015-04-30: qty 1

## 2015-04-30 MED ORDER — PREDNISONE 10 MG PO TABS: 20.0000 mg | ORAL_TABLET | Freq: Two times a day (BID) | ORAL | Status: DC

## 2015-04-30 MED ORDER — EPINEPHRINE 0.3 MG/0.3ML IJ SOAJ: 0.3000 mg | Freq: Once | INTRAMUSCULAR | Status: DC

## 2015-04-30 MED ORDER — METHYLPREDNISOLONE SODIUM SUCC 125 MG IJ SOLR
125.0000 mg | Freq: Once | INTRAMUSCULAR | Status: AC
  Administered 2015-04-30: 125 mg via INTRAVENOUS
  Filled 2015-04-30: qty 2

## 2015-04-30 MED ORDER — RANITIDINE HCL 15 MG/ML PO SYRP
75.0000 mg | ORAL_SOLUTION | Freq: Once | ORAL | Status: AC
  Administered 2015-04-30: 75 mg via ORAL
  Filled 2015-04-30: qty 5

## 2015-04-30 NOTE — Discharge Instructions (Signed)
Prednisone as prescribed.  Benadryl 25 mg every 6 hours for the next 3 days.  Return to the emergency department if you experience any new or worsening symptoms.   Angioedema Angioedema is a sudden swelling of tissues, often of the skin. It can occur on the face or genitals or in the abdomen or other body parts. The swelling usually develops over a short period and gets better in 24 to 48 hours. It often begins during the night and is found when the person wakes up. The person may also get red, itchy patches of skin (hives). Angioedema can be dangerous if it involves swelling of the air passages.  Depending on the cause, episodes of angioedema may only happen once, come back in unpredictable patterns, or repeat for several years and then gradually fade away.  CAUSES  Angioedema can be caused by an allergic reaction to various triggers. It can also result from nonallergic causes, including reactions to drugs, immune system disorders, viral infections, or an abnormal gene that is passed to you from your parents (hereditary). For some people with angioedema, the cause is unknown.  Some things that can trigger angioedema include:   Foods.   Medicines, such as ACE inhibitors, ARBs, nonsteroidal anti-inflammatory agents, or estrogen.   Latex.   Animal saliva.   Insect stings.   Dyes used in X-rays.   Mild injury.   Dental work.  Surgery.  Stress.   Sudden changes in temperature.   Exercise. SIGNS AND SYMPTOMS   Swelling of the skin.  Hives. If these are present, there is also intense itching.  Redness in the affected area.   Pain in the affected area.  Swollen lips or tongue.  Breathing problems. This may happen if the air passages swell.  Wheezing. If internal organs are involved, there may be:   Nausea.   Abdominal pain.   Vomiting.   Difficulty swallowing.   Difficulty passing urine. DIAGNOSIS   Your health care provider will examine the  affected area and take a medical and family history.  Various tests may be done to help determine the cause. Tests may include:  Allergy skin tests to see if the problem is an allergic reaction.   Blood tests to check for hereditary angioedema.   Tests to check for underlying diseases that could cause the condition.   A review of your medicines, including over-the-counter medicines, may be done. TREATMENT  Treatment will depend on the cause of the angioedema. Possible treatments include:   Removal of anything that triggered the condition (such as stopping certain medicines).   Medicines to treat symptoms or prevent attacks. Medicines given may include:   Antihistamines.   Epinephrine injection.   Steroids.   Hospitalization may be required for severe attacks. If the air passages are affected, it can be an emergency. Tubes may need to be placed to keep the airway open. HOME CARE INSTRUCTIONS   Take all medicines as directed by your health care provider.  If you were given medicines for emergency allergy treatment, always carry them with you.  Wear a medical bracelet as directed by your health care provider.   Avoid known triggers. SEEK MEDICAL CARE IF:   You have repeat attacks of angioedema.   Your attacks are more frequent or more severe despite preventive measures.   You have hereditary angioedema and are considering having children. It is important to discuss with your health care provider the risks of passing the condition on to your children. SEEK IMMEDIATE  MEDICAL CARE IF:   You have severe swelling of the mouth, tongue, or lips.  You have difficulty breathing.   You have difficulty swallowing.   You faint. MAKE SURE YOU:  Understand these instructions.  Will watch your condition.  Will get help right away if you are not doing well or get worse. Document Released: 01/31/2002 Document Revised: 04/08/2014 Document Reviewed:  07/16/2013 [facility] Patient Information 2015 [place], [place]. This information is not intended to replace advice given to you by your health care provider. Make sure you discuss any questions you have with your health care provider.    Emergency Department Resource Guide 1) Find a Doctor and Pay Out of Pocket Although you won't have to find out who is covered by your insurance plan, it is a good idea to ask around and get recommendations. You will then need to call the office and see if the doctor you have chosen will accept you as a new patient and what types of options they offer for patients who are self-pay. Some doctors offer discounts or will set up payment plans for their patients who do not have insurance, but you will need to ask so you aren't surprised when you get to your appointment.  2) Contact Your Local Health Department Not all health departments have doctors that can see patients for sick visits, but many do, so it is worth a call to see if yours does. If you don't know where your local health department is, you can check in your phone book. The CDC also has a tool to help you locate your state's health department, and many state websites also have listings of all of their local health departments.  3) Find a [facility] If your illness is not likely to be very severe or complicated, you may want to try a walk in clinic. These are popping up all over the country in pharmacies, drugstores, and shopping centers. They're usually staffed by nurse practitioners or physician assistants that have been trained to treat common illnesses and complaints. They're usually fairly quick and inexpensive. However, if you have serious medical issues or chronic medical problems, these are probably not your best option.  No Primary Care Doctor: - Call Health Connect at  [phone] - they can help you locate a primary care doctor that  accepts your insurance, provides certain services, etc. - Physician  Referral Service- [phone]  Chronic Pain Problems: Organization         Address  Phone   Notes  [facility]  [phone] Patients need to be referred by their primary care doctor.   Medication Assistance: Organization         Address  Phone   Notes  [facility] Medication [facility] [address] [place], [place] [address] [phone] --Must be a resident of [facility] -- Must have NO insurance coverage whatsoever (no Medicaid/ Medicare, etc.) -- The pt. MUST have a primary care doctor that directs their care regularly and follows them in the community   MedAssist  [phone]   [employer]  [phone]    Agencies that provide inexpensive medical care: Organization         Address  Phone   Notes  [place]  [phone]   [facility]    [phone]   [facility] [place], [place] [address] [phone]   [facility]  [place] [address] [place] [phone]   Planned Parenthood    [phone]   [facility]    [phone]   [place] and [place] [address], [place] Phone:  [phone], Fax:  [phone] Hours of Operation:  9 am - 6 pm, M-F.  Also accepts Medicaid/Medicare and self-pay.  [facility] for [place] [address], [address], [place] Phone: [phone], Fax: [phone]. Hours of Operation:  8:30 am - 5:30 pm, M-F.  Also accepts Medicaid and self-pay.  [facility] [address], [place] Phone: [phone]   [place], [place], [place] [phone], Ext. [phone] Mondays & Thursdays: 7-9 AM.  First 15 patients are seen on a first come, first serve basis.    [place] Providers:  Organization         Address  Phone   Notes  [facility] [address], [place] [phone] Also accepts self-pay patients.  [facility] [id number] [place], [place]  [phone]   [place], [address], [place] [phone]   [facility] [address] [place] [phone]   Lucianne Lei [address], [place]   [phone] Only accepts [place] Access [place] patients after they have their name applied to their card.   Self-Pay (no insurance) in [facility]:  Organization         Address  Phone   Notes  Sickle Cell Patients, [facility] [place] [phone]   [facility] [place] [phone]   [facility] [place]  [place], [place], [place] [phone]   [facility]/Dr. Osei-Bonsu  [address] [place] or [address], [place] [phone] Phone number for both [place] and [place] locations is the same.  Urgent Medical and [facility] [address], [place] [phone]   [facility] [address] [place] or [address] [phone]   [facility] [address], [place] [phone], phone; [phone], fax Sees patients 1st and 3rd Saturday of every month.  Must not qualify for public or private insurance (i.e. Medicaid, Medicare, [place] Health Choice, Veterans' Benefits)  Household income should be no more than 200% of the poverty level The clinic cannot treat you if you are pregnant or think you are pregnant  Sexually transmitted diseases are not treated at the clinic.    Dental Care: Organization         Address  Phone  Notes  [facility] Department of [facility] [place] [phone] Accepts children up to age 44 who are enrolled  in [place] or [place]; pregnant women with a Medicaid card; and children who have applied for Medicaid or [place] Health Choice, but were declined, whose parents can pay a reduced fee at time of service.  [facility] Department of [facility]  [address], [place] [phone] Accepts children up to age 31 who are enrolled in [place] or [place]; pregnant women with a Medicaid card; and children who have  applied for Medicaid or [place] Health Choice, but were declined, whose parents can pay a reduced fee at time of service.  [facility] Adult [facility] Access PROGRAM  [place] [phone] Patients are seen by appointment only. Walk-ins are not accepted. [place] will see patients 16 years of age and older. Monday - Tuesday (8am-5pm) Most Wednesdays (8:30-5pm) $30 per visit, cash only  [facility] Adult Dental Access PROGRAM  [address], [facility] [phone] Patients are seen by appointment only. Walk-ins are not accepted. [place] will see patients 28 years of age and older. One Wednesday Evening (Monthly: Volunteer Based).  $30 per visit, cash only  [place]  [phone] for adults; Children under age 60, call Graduate Pediatric Dentistry at [phone]. Children aged 5-14, please call [phone] to request a pediatric application.  Dental services are provided in all areas of dental care including fillings, crowns and bridges, complete and partial dentures, implants, gum treatment, root canals, and extractions. Preventive care is also provided. Treatment is provided to both adults and children. Patients are selected via a lottery and there is often a waiting list.   [facility] [address] [place]  [phone] [email or web address]   [facility] [address] [place], [place] [phone], Ext. [phone] Second and Fourth Thursday of each month, opens at  6:30 AM; Clinic ends at 9 AM.  Patients are seen on a first-come first-served basis, and a limited number are seen during each clinic.   [facility]  [address] Hillard Danker [place], [place] [phone]   Eligibility Requirements You must have lived in [place], [place], or [place] counties for at least the last three months.   You cannot be eligible for state or federal sponsored [employer], including [employer], [place], or [employer].   You generally cannot be eligible for healthcare insurance through your employer.    How to apply: Eligibility screenings are held every Tuesday and Wednesday afternoon from 1:00 pm until 4:00 pm. You do not need an appointment for the interview!  [facility] [address], [place], [place]   [place] Department  [place]  [phone]    Behavioral Health Resources in the Community: Intensive Outpatient Programs Organization         Address  Phone  Notes  [place]. [address], [place], [place] [phone]   [facility] Outpatient [address], [place], [place]   ADS: Alcohol & Drug Svcs [address], [place], [place]   [place] [address]  [place], [place] or [phone]   Substance Abuse Resources Organization         Address  Phone  Notes  Alcohol and Drug Services  [phone]   [place]  [phone]   [facility] [place]   Chinita Pester  [phone]   Residential & Outpatient Substance Abuse Program  [phone]   Psychological Services Organization         Address  Phone  Notes  [facility] [place]  [place]  [phone]   [place] [address], [place] or [phone]    Mobile Crisis Teams Organization  Address  Phone  Notes  [facility], Mobile Crisis Care Unit  [phone]   Assertive Psychotherapeutic Services  [address] [place], [place]   [facility] [address], [place] [phone]    Self-Help/Support Groups Organization         Address  Phone             Notes  [place]. of [place] - variety of support groups  [place] Call for more information  Narcotics Anonymous (NA), [facility]aring [facility] [address], [employer] [place]  2 meetings at this location   Special educational needs teacher         Address  Phone  Notes  ASAP Residential Treatment [place],    [place]  [phone]   [facility]  [address] [place] [id number], [place], [place]   [place], [place] [phone] Admissions: 8am-3pm M-F  Incentives Substance [place] [phone],    [place], [place] [phone]   [facility] [address] [place], [place], [place]   [facility] [address]  [place], [place]   [facility] Programs - Intensive Outpatient [address], [place], [place]   [facility] ([place].,  [employer] [place], [place] [phone] or [phone]   [facility]idential Treatment Services (RTS) [address] [place], [place] Accepts Medicaid  [place] [address]  [place] [phone] Substance Abuse/Addiction Treatment   [facility] Organization         Address  Phone  Notes  [facility]  [phone]   Domenic Schwab, PhD [address] [place], [place]   [phone] or [phone]   [place], [place] [phone]   [facility]  [address], [place], [place] [phone] Insurance/Medicaid/sponsorship through [facility] and Families [address]                                    [place], [place] [phone] [place] [address], [place] [phone]    Dr. Adele Schilder  [phone]   [facility] [place] Dept. 1) [address] [place] 2) East End 3)  [place] [address], [place] [phone]  [phone]   [place] [phone] or [phone] (After Hours)

## 2015-04-30 NOTE — ED Provider Notes (Signed)
CSN: [id number]     Arrival date & time 04/30/15  [id number] History   First MD Initiated Contact with Patient 04/30/15 0530     Chief Complaint  Patient presents with  . Allergic Reaction    The patient said he woke up and his tongue was swollen.  He said the only thing he knows is that he ate brownies before he went to bed and he woke up with a swollen tongue.     (Consider location/radiation/quality/duration/timing/severity/associated sxs/prior Treatment) Patient is a 54 y.o. male presenting with allergic reaction.  Allergic Reaction Presenting symptoms: difficulty breathing, difficulty swallowing, itching and swelling (tongue)   Severity:  Severe Prior allergic episodes:  Unable to specify Context comment:  Ho angioedema, ate new brownies prior to going to sleep Relieved by:  Epinephrine Worsened by:  Nothing tried Ineffective treatments:  None tried Duration: 30 min Timing constant, worsening  Past Medical History  Diagnosis Date  . Coronary artery disease   . Hypertension    History reviewed. No pertinent past surgical history. History reviewed. No pertinent family history. History  Substance Use Topics  . Smoking status: Current Every Day Smoker  . Smokeless tobacco: Not on file  . Alcohol Use: No    Review of Systems  HENT: Positive for trouble swallowing.   Skin: Positive for itching.  All other systems reviewed and are negative.     Allergies  Lidocaine hcl; Lisinopril; and Penicillins  Home Medications   Prior to Admission medications   Medication Sig Start Date End Date Taking? Authorizing Provider  albuterol (PROVENTIL HFA;VENTOLIN HFA) 108 (90 BASE) MCG/ACT inhaler Inhale 2 puffs into the lungs every 4 (four) hours as needed for wheezing or shortness of breath. 01/14/15  Yes Janne Napoleon, NP  aspirin EC 81 MG tablet Take 81 mg by mouth daily.   Yes Historical Provider, MD  atorvastatin (LIPITOR) 40 MG tablet Take 40 mg by mouth daily.   Yes Historical  Provider, MD  carvedilol (COREG) 25 MG tablet Take 25 mg by mouth 2 (two) times daily with a meal.   Yes Historical Provider, MD  hydrALAZINE (APRESOLINE) 25 MG tablet Take 25 mg by mouth 3 (three) times daily.   Yes Historical Provider, MD  hydrochlorothiazide (HYDRODIURIL) 25 MG tablet Take 25 mg by mouth daily.   Yes Historical Provider, MD  isosorbide dinitrate (ISORDIL) 20 MG tablet Take 20 mg by mouth 3 (three) times daily.   Yes Historical Provider, MD  montelukast (SINGULAIR) 10 MG tablet Take 10 mg by mouth at bedtime.   Yes Historical Provider, MD  clindamycin (CLEOCIN) 300 MG capsule Take 1 capsule (300 mg total) by mouth 3 (three) times daily. Patient not taking: Reported on 04/30/2015 01/14/15   Janne Napoleon, NP  EPINEPHrine 0.3 mg/0.3 mL IJ SOAJ injection Inject 0.3 mLs (0.3 mg total) into the muscle once. 04/30/15   Veryl Speak, MD  predniSONE (DELTASONE) 10 MG tablet Take 2 tablets (20 mg total) by mouth 2 (two) times daily. 04/30/15   Veryl Speak, MD  ranitidine (ZANTAC) 150 MG tablet Take 1 tablet (150 mg total) by mouth 2 (two) times daily. Patient not taking: Reported on 04/30/2015 12/18/14   Harden Mo, MD   BP 172/113 mmHg  Pulse 75  Temp(Src) 98 F (36.7 C) (Oral)  Resp 18  SpO2 97% Physical Exam  Constitutional: He is oriented to person, place, and time. He appears well-developed and well-nourished.  HENT:  Head: Normocephalic and atraumatic.  Swelling of  tongue, not lips  Eyes: Conjunctivae and EOM are normal.  Neck: Normal range of motion. Neck supple.  Cardiovascular: Normal rate, regular rhythm and normal heart sounds.   Pulmonary/Chest: Effort normal and breath sounds normal. No respiratory distress.  Abdominal: He exhibits no distension. There is no tenderness. There is no rebound and no guarding.  Musculoskeletal: Normal range of motion.  Neurological: He is alert and oriented to person, place, and time.  Skin: Skin is warm and dry.  Vitals  reviewed.   ED Course  Procedures (including critical care time) Labs Review Labs Reviewed  CBC WITH DIFFERENTIAL/PLATELET - Abnormal; Notable for the following:    RDW 15.9 (*)    All other components within normal limits  BASIC METABOLIC PANEL - Abnormal; Notable for the following:    Potassium 3.1 (*)    Glucose, Bld 125 (*)    Creatinine, Ser 1.81 (*)    Calcium 8.7 (*)    GFR calc non Af Amer 42 (*)    GFR calc Af Amer 48 (*)    All other components within normal limits    Imaging Review No results found.   EKG Interpretation None      MDM   Final diagnoses:  Tongue swelling  Angioedema, initial encounter    54 y.o. male with pertinent PMH of angioedema of unknown etiology presents with recurrent angioedema.  Pt ate a brownie from a friend last night, however has no known allergies that he is aware of.  No recent illness otherwise.  He gave himself IM epi 15 min PTA, on my exam states he is feeling better but does have a large tongue.  Pt given zantac, solumedrol, and benadryl here.    Pt care to Dr. Stark Jock for observation.  I have reviewed all laboratory and imaging studies if ordered as above  1. Tongue swelling   2. Angioedema, initial encounter         Debby Freiberg, MD 05/01/15 [phone]

## 2015-04-30 NOTE — ED Provider Notes (Signed)
Patient is a 54 year old male who presents with swelling of the tongue and mouth since last night. He gave himself a shot with an EpiPen which seems to have helped. He has also received steriods and histamines in the ER and his swelling is significantly improved. I feel as though he is appropriate for discharge. I will continue steroids for the next 2 days along with Benadryl. He is to return as needed for any problems.  Veryl Speak, MD 04/30/15 [phone]

## 2015-04-30 NOTE — ED Notes (Addendum)
The patient said he woke up and his tongue was swollen.  He said the only thing he knows is that he ate brownies before he went to bed and he woke up with a swollen tongue.  The patient has an EPI but he was unable to tell me why.  He shot himself with the EPI pen in the left thigh.  The patient denies any SOB or any other symptoms.

## 2015-05-26 ENCOUNTER — Other Ambulatory Visit: Payer: Self-pay | Admitting: Cardiovascular Disease

## 2015-05-26 ENCOUNTER — Ambulatory Visit
Admission: RE | Admit: 2015-05-26 | Discharge: 2015-05-26 | Disposition: A | Discharge status: Home / Self Care | Payer: No Typology Code available for payment source | Source: Ambulatory Visit | Attending: Cardiovascular Disease | Admitting: Cardiovascular Disease

## 2015-05-26 DIAGNOSIS — M545 Low back pain: Secondary | ICD-10-CM

## 2015-07-03 ENCOUNTER — Emergency Department (HOSPITAL_COMMUNITY): Payer: Self-pay

## 2015-07-03 ENCOUNTER — Emergency Department (HOSPITAL_COMMUNITY)
Admission: EM | Admit: 2015-07-03 | Discharge: 2015-07-03 | Disposition: A | Discharge status: Home / Self Care | Payer: Self-pay | Attending: Emergency Medicine | Admitting: Emergency Medicine

## 2015-07-03 ENCOUNTER — Encounter (HOSPITAL_COMMUNITY): Payer: Self-pay | Admitting: Vascular Surgery

## 2015-07-03 DIAGNOSIS — I10 Essential (primary) hypertension: Secondary | ICD-10-CM | POA: Insufficient documentation

## 2015-07-03 DIAGNOSIS — Z87891 Personal history of nicotine dependence: Secondary | ICD-10-CM | POA: Insufficient documentation

## 2015-07-03 DIAGNOSIS — E669 Obesity, unspecified: Secondary | ICD-10-CM | POA: Insufficient documentation

## 2015-07-03 DIAGNOSIS — Z79899 Other long term (current) drug therapy: Secondary | ICD-10-CM | POA: Insufficient documentation

## 2015-07-03 DIAGNOSIS — Z95810 Presence of automatic (implantable) cardiac defibrillator: Secondary | ICD-10-CM | POA: Insufficient documentation

## 2015-07-03 DIAGNOSIS — I2510 Atherosclerotic heart disease of native coronary artery without angina pectoris: Secondary | ICD-10-CM | POA: Insufficient documentation

## 2015-07-03 DIAGNOSIS — Z7982 Long term (current) use of aspirin: Secondary | ICD-10-CM | POA: Insufficient documentation

## 2015-07-03 DIAGNOSIS — Z8674 Personal history of sudden cardiac arrest: Secondary | ICD-10-CM | POA: Insufficient documentation

## 2015-07-03 DIAGNOSIS — I509 Heart failure, unspecified: Secondary | ICD-10-CM | POA: Insufficient documentation

## 2015-07-03 DIAGNOSIS — Z880 Allergy status to penicillin: Secondary | ICD-10-CM | POA: Insufficient documentation

## 2015-07-03 HISTORY — DX: Cardiac arrest, cause unspecified: I46.9

## 2015-07-03 LAB — I-STAT TROPONIN, ED: Troponin i, poc: 0.01 ng/mL (ref 0.00–0.08)

## 2015-07-03 LAB — BRAIN NATRIURETIC PEPTIDE: B Natriuretic Peptide: 459 pg/mL — ABNORMAL HIGH (ref 0.0–100.0)

## 2015-07-03 LAB — BASIC METABOLIC PANEL
Anion gap: 9 (ref 5–15)
BUN: 13 mg/dL (ref 6–20)
CO2: 26 mmol/L (ref 22–32)
Calcium: 8.5 mg/dL — ABNORMAL LOW (ref 8.9–10.3)
Chloride: 107 mmol/L (ref 101–111)
Creatinine, Ser: 2.02 mg/dL — ABNORMAL HIGH (ref 0.61–1.24)
GFR calc Af Amer: 42 mL/min — ABNORMAL LOW (ref >60)
GFR calc non Af Amer: 36 mL/min — ABNORMAL LOW (ref >60)
Glucose, Bld: 126 mg/dL — ABNORMAL HIGH (ref 65–99)
Potassium: 3.3 mmol/L — ABNORMAL LOW (ref 3.5–5.1)
Sodium: 142 mmol/L (ref 135–145)

## 2015-07-03 LAB — CBC
HCT: 43.9 % (ref 39.0–52.0)
Hemoglobin: 15.1 g/dL (ref 13.0–17.0)
MCH: 30.3 pg (ref 26.0–34.0)
MCHC: 34.4 g/dL (ref 30.0–36.0)
MCV: 88.2 fL (ref 78.0–100.0)
Platelets: 204 10*3/uL (ref 150–400)
RBC: 4.98 MIL/uL (ref 4.22–5.81)
RDW: 16.1 % — ABNORMAL HIGH (ref 11.5–15.5)
WBC: 4.6 10*3/uL (ref 4.0–10.5)

## 2015-07-03 MED ORDER — CARVEDILOL 25 MG PO TABS
25.0000 mg | ORAL_TABLET | Freq: Two times a day (BID) | ORAL | Status: DC
  Filled 2015-07-03: qty 1

## 2015-07-03 MED ORDER — ISOSORBIDE DINITRATE 20 MG PO TABS
20.0000 mg | ORAL_TABLET | Freq: Three times a day (TID) | ORAL | Status: DC
  Administered 2015-07-03: 20 mg via ORAL
  Filled 2015-07-03: qty 1

## 2015-07-03 MED ORDER — POTASSIUM CHLORIDE ER 20 MEQ PO TBCR: 20.0000 meq | EXTENDED_RELEASE_TABLET | Freq: Every day | ORAL | Status: DC

## 2015-07-03 MED ORDER — HYDRALAZINE HCL 25 MG PO TABS
25.0000 mg | ORAL_TABLET | Freq: Three times a day (TID) | ORAL | Status: DC
  Administered 2015-07-03: 25 mg via ORAL
  Filled 2015-07-03: qty 1

## 2015-07-03 NOTE — ED Notes (Signed)
MD Knapp at bedside 

## 2015-07-03 NOTE — H&P (Signed)
Referring Physician:  Darryel Diodato is an 54 y.o. male.                       Chief Complaint: Shortness oif breath.  HPI: 54 year old male with 1 week of progressive worsening of shortness of breath without chest pain. Lasix helping some. Patient wants to go home against medical advise.  Past Medical History  Diagnosis Date  . Coronary artery disease   . Hypertension   . Cardiac arrest       Past Surgical History  Procedure Laterality Date  . Implantable cardioverter defibrillator implant      No family history on file. Social History:  reports that he has quit smoking. He does not have any smokeless tobacco history on file. He reports that he does not drink alcohol or use illicit drugs.  Allergies:  Allergies  Allergen Reactions  . Lidocaine Hcl     REACTION: tongue swelling  . Lisinopril     REACTION: tougne swelling.Pt reported problem with a BP med which sounded like  lisinopril  But as of 09/19/06,pt had tolerated altace without problem  . Penicillins     Cillin family per pt.  Mw,cma     (Not in a hospital admission)  Results for orders placed or performed during the hospital encounter of 07/03/15 (from the past 48 hour(s))  Basic metabolic panel     Status: Abnormal   Collection Time: 07/03/15  4:00 PM  Result Value Ref Range   Sodium 142 135 - 145 mmol/L   Potassium 3.3 (L) 3.5 - 5.1 mmol/L   Chloride 107 101 - 111 mmol/L   CO2 26 22 - 32 mmol/L   Glucose, Bld 126 (H) 65 - 99 mg/dL   BUN 13 6 - 20 mg/dL   Creatinine, Ser 2.02 (H) 0.61 - 1.24 mg/dL   Calcium 8.5 (L) 8.9 - 10.3 mg/dL   GFR calc non Af Amer 36 (L) >60 mL/min   GFR calc Af Amer 42 (L) >60 mL/min    Comment: (NOTE) The eGFR has been calculated using the CKD EPI equation. This calculation has not been validated in all clinical situations. eGFR's persistently <60 mL/min signify possible Chronic Kidney Disease.    Anion gap 9 5 - 15  CBC     Status: Abnormal   Collection Time: 07/03/15   4:00 PM  Result Value Ref Range   WBC 4.6 4.0 - 10.5 K/uL   RBC 4.98 4.22 - 5.81 MIL/uL   Hemoglobin 15.1 13.0 - 17.0 g/dL   HCT 43.9 39.0 - 52.0 %   MCV 88.2 78.0 - 100.0 fL   MCH 30.3 26.0 - 34.0 pg   MCHC 34.4 30.0 - 36.0 g/dL   RDW 16.1 (H) 11.5 - 15.5 %   Platelets 204 150 - 400 K/uL  Brain natriuretic peptide     Status: Abnormal   Collection Time: 07/03/15  4:00 PM  Result Value Ref Range   B Natriuretic Peptide 459.0 (H) 0.0 - 100.0 pg/mL  I-stat troponin, ED     Status: None   Collection Time: 07/03/15  4:10 PM  Result Value Ref Range   Troponin i, poc 0.01 0.00 - 0.08 ng/mL   Comment 3            Comment: Due to the release kinetics of cTnI, a negative result within the first hours of the onset of symptoms does not rule out myocardial infarction with certainty. If  myocardial infarction is still suspected, repeat the test at appropriate intervals.    Dg Chest 2 View  07/03/2015   CLINICAL DATA:  Shortness of breath for 2 days  EXAM: CHEST - 2 VIEW  COMPARISON:  01/14/2015  FINDINGS: Cardiac shadow is stable. A defibrillator is again seen and stable. The lungs are well aerated bilaterally. No focal confluent infiltrate is seen. Mild interstitial changes are noted which may be related to early vascular congestion. No acute bony abnormality is seen.  IMPRESSION: Mild early vascular congestion with interstitial changes.   Electronically Signed   By: Inez Catalina M.D.   On: 07/03/2015 16:25    Review Of Systems Constitutional: Negative for fever.  Respiratory: Positive for shortness of breath.  All other systems reviewed and are negative.  Blood pressure 169/107, pulse 88, temperature 97.9 F (36.6 C), temperature source Oral, resp. rate 24, height $RemoveBe'5\' 7"'NlCDuVzOm$  (1.702 m), weight 136.623 kg (301 lb 3.2 oz), SpO2 96 %. Physical Exam  Constitutional: He appears well-developed and over-nourished. Mild respiratory distress.   HENT: Normocephalic and atraumatic. Right Ear: External  ear normal. Left Ear: External ear normal.  Eyes: [place] Shark, Conjunctivae are normal. Right eye exhibits no discharge. Left eye exhibits no discharge. No scleral icterus.  Neck: Neck supple. No tracheal deviation present.  Cardiovascular: Normal rate, regular rhythm and intact distal pulses.  Pulmonary/Chest: Effort normal and breath sounds normal. No stridor. Mild respiratory distress. He has no wheezes. He has no rales.  Abdominal: Soft. Bowel sounds are normal. He exhibits no distension. There is no tenderness. There is no rebound and no guarding.  Musculoskeletal: He exhibits edema. He exhibits no tenderness.  Neurological: He is alert. He has normal strength. No cranial nerve deficit  He exhibits normal muscle tone. He displays no seizure activity. Coordination normal.  Skin: Skin is warm and dry. No rash noted.  Psychiatric: He has a normal mood and affect.  Nursing note and vitals reviewed.  Assessment/Plan Acute congestive heart failure Hypertension Morbid obesity Hypokalemia Hyperglycemia  Patient refuses admission as he has to meet family members tomorrow. He agrees to come back if not feeling better. Resume home medications except HCTZ. He understood to change diet to low fat and low salt. Potassium supplement. Patient advised to stop HCTZ when using lasix. F/U in AM (or when back in town) in office for possible echocardiogram and additional cardiac work-up plan. HgA1C ordered   Birdie Riddle, MD  07/03/2015, 10:15 PM

## 2015-07-03 NOTE — ED Notes (Signed)
Knapp MD at bedside  

## 2015-07-03 NOTE — ED Provider Notes (Addendum)
CSN: [id number]     Arrival date & time 07/03/15  1542 History   First MD Initiated Contact with Patient 07/03/15 1838     Chief Complaint  Patient presents with  . Shortness of Breath   Patient is a 54 y.o. male presenting with shortness of breath. The history is provided by the patient.  Shortness of Breath Severity:  Moderate Onset quality:  Gradual Duration:  1 week Timing:  Constant Progression:  Worsening Context: activity   Relieved by:  Rest Worsened by:  Exertion (He saw his doctor over a week ago and was started on lasix.  It has helped a little bit.) Ineffective treatments:  Diuretics Associated symptoms: no fever   Risk factors: no hx of PE/DVT     Past Medical History  Diagnosis Date  . Coronary artery disease   . Hypertension   . Cardiac arrest    Past Surgical History  Procedure Laterality Date  . Implantable cardioverter defibrillator implant     No family history on file. History  Substance Use Topics  . Smoking status: Former Research scientist (life sciences)  . Smokeless tobacco: Not on file  . Alcohol Use: No    Review of Systems  Constitutional: Negative for fever.  Respiratory: Positive for shortness of breath.   All other systems reviewed and are negative.     Allergies  Lidocaine hcl; Lisinopril; and Penicillins  Home Medications   Prior to Admission medications   Medication Sig Start Date End Date Taking? Authorizing Provider  albuterol (PROVENTIL HFA;VENTOLIN HFA) 108 (90 BASE) MCG/ACT inhaler Inhale 2 puffs into the lungs every 4 (four) hours as needed for wheezing or shortness of breath. 01/14/15  Yes Janne Napoleon, NP  aspirin EC 81 MG tablet Take 81 mg by mouth daily.   Yes Historical Provider, MD  atorvastatin (LIPITOR) 40 MG tablet Take 40 mg by mouth daily.   Yes Historical Provider, MD  carvedilol (COREG) 25 MG tablet Take 25 mg by mouth 2 (two) times daily with a meal.   Yes Historical Provider, MD  EPINEPHrine 0.3 mg/0.3 mL IJ SOAJ injection Inject 0.3  mLs (0.3 mg total) into the muscle once. 04/30/15  Yes Veryl Speak, MD  furosemide (LASIX) 40 MG tablet Take 40 mg by mouth daily.   Yes Historical Provider, MD  hydrALAZINE (APRESOLINE) 25 MG tablet Take 25 mg by mouth 3 (three) times daily.   Yes Historical Provider, MD  hydrochlorothiazide (HYDRODIURIL) 25 MG tablet Take 25 mg by mouth daily.   Yes Historical Provider, MD  HYDROcodone-acetaminophen (NORCO) 7.5-325 MG per tablet Take 1 tablet by mouth every 6 (six) hours as needed for moderate pain.   Yes Historical Provider, MD  isosorbide dinitrate (ISORDIL) 20 MG tablet Take 20 mg by mouth 3 (three) times daily.   Yes Historical Provider, MD  methocarbamol (ROBAXIN) 500 MG tablet Take 500 mg by mouth 3 (three) times daily.   Yes Historical Provider, MD  montelukast (SINGULAIR) 10 MG tablet Take 10 mg by mouth at bedtime.   Yes Historical Provider, MD  clindamycin (CLEOCIN) 300 MG capsule Take 1 capsule (300 mg total) by mouth 3 (three) times daily. Patient not taking: Reported on 04/30/2015 01/14/15   Janne Napoleon, NP  predniSONE (DELTASONE) 10 MG tablet Take 2 tablets (20 mg total) by mouth 2 (two) times daily. Patient not taking: Reported on 07/03/2015 04/30/15   Veryl Speak, MD  ranitidine (ZANTAC) 150 MG tablet Take 1 tablet (150 mg total) by mouth 2 (two) times  daily. Patient not taking: Reported on 04/30/2015 12/18/14   Harden Mo, MD   BP 169/107 mmHg  Pulse 88  Temp(Src) 97.9 F (36.6 C) (Oral)  Resp 24  Ht 5\' 7"  (1.702 m)  Wt 301 lb 3.2 oz (136.623 kg)  BMI 47.16 kg/m2  SpO2 96% Physical Exam  Constitutional: He appears well-developed and well-nourished. No distress.  Obese  HENT:  Head: Normocephalic and atraumatic.  Right Ear: External ear normal.  Left Ear: External ear normal.  Eyes: Conjunctivae are normal. Right eye exhibits no discharge. Left eye exhibits no discharge. No scleral icterus.  Neck: Neck supple. No tracheal deviation present.  Cardiovascular: Normal  rate, regular rhythm and intact distal pulses.   Pulmonary/Chest: Effort normal and breath sounds normal. No stridor. No respiratory distress. He has no wheezes. He has no rales.  Abdominal: Soft. Bowel sounds are normal. He exhibits no distension. There is no tenderness. There is no rebound and no guarding.  Musculoskeletal: He exhibits edema. He exhibits no tenderness.  Neurological: He is alert. He has normal strength. No cranial nerve deficit (no facial droop, extraocular movements intact, no slurred speech) or sensory deficit. He exhibits normal muscle tone. He displays no seizure activity. Coordination normal.  Skin: Skin is warm and dry. No rash noted.  Psychiatric: He has a normal mood and affect.  Nursing note and vitals reviewed.   ED Course  Procedures (including critical care time) Labs Review Labs Reviewed  BASIC METABOLIC PANEL - Abnormal; Notable for the following:    Potassium 3.3 (*)    Glucose, Bld 126 (*)    Creatinine, Ser 2.02 (*)    Calcium 8.5 (*)    GFR calc non Af Amer 36 (*)    GFR calc Af Amer 42 (*)    All other components within normal limits  CBC - Abnormal; Notable for the following:    RDW 16.1 (*)    All other components within normal limits  BRAIN NATRIURETIC PEPTIDE - Abnormal; Notable for the following:    B Natriuretic Peptide 459.0 (*)    All other components within normal limits  I-STAT TROPOININ, ED    Imaging Review Dg Chest 2 View  07/03/2015   CLINICAL DATA:  Shortness of breath for 2 days  EXAM: CHEST - 2 VIEW  COMPARISON:  01/14/2015  FINDINGS: Cardiac shadow is stable. A defibrillator is again seen and stable. The lungs are well aerated bilaterally. No focal confluent infiltrate is seen. Mild interstitial changes are noted which may be related to early vascular congestion. No acute bony abnormality is seen.  IMPRESSION: Mild early vascular congestion with interstitial changes.   Electronically Signed   By: Inez Catalina M.D.   On:  07/03/2015 16:25     EKG Interpretation   Date/Time:  Thursday July 03 2015 15:50:00 EDT Ventricular Rate:  107 PR Interval:  152 QRS Duration: 114 QT Interval:  376 QTC Calculation: 501 R Axis:   -47 Text Interpretation:  Sinus tachycardia with occasional and consecutive  Premature ventricular complexes and Fusion complexes Possible Left atrial  enlargement Incomplete right bundle branch block Left anterior fascicular  block Possible Anteroseptal infarct , age undetermined Abnormal ECG No  significant change since last tracing Confirmed by Gorje Iyer  MD-J, Marynell Bies  [id number]) on 07/03/2015 8:16:17 PM     Medications  hydrALAZINE (APRESOLINE) tablet 25 mg (not administered)  carvedilol (COREG) tablet 25 mg (not administered)  isosorbide dinitrate (ISORDIL) tablet 20 mg (not administered)  MDM   Final diagnoses:  Acute on chronic congestive heart failure, unspecified congestive heart failure type    Patient has hypertension and peripheral edema. His symptoms are most likely related to congestive heart failure. Discussed the case with Dr. Doylene Canard. Pt does have worsening renal insufficiency.  Increasing his lasix may cause issues.  Pt has not had an echo.  He would prefer to admit him to the hospital and get an echo.  Pt states he has a trip planned and he does not want to stay.  He does agree to see his cardiologist in the ED for now at least.    Dorie Rank, MD 07/03/15 2201  Dorie Rank, MD 07/03/15 2202

## 2015-07-03 NOTE — ED Notes (Addendum)
Pt. Agitated at the moment wanting to speak with the nurse. Has taken leads and cords off and is sitting on the nurses stool asking for something to drink.

## 2015-07-03 NOTE — ED Notes (Signed)
Pt reports to the ED for eval of SOB x several days. Reports the SOB is at rest and with exertion. Also has bilateral leg swelling. Was seen at [facility] and placed on Lasix for it last Wednesday. Denies any CP, cough, fevers, or chills. Pt A&Ox4, resp e/u, and skin warm and dry. Pt has hx of CHF.

## 2015-08-02 ENCOUNTER — Encounter (HOSPITAL_COMMUNITY): Payer: Self-pay | Admitting: *Deleted

## 2015-08-02 ENCOUNTER — Inpatient Hospital Stay (HOSPITAL_COMMUNITY)
Admission: EM | Admit: 2015-08-02 | Discharge: 2015-08-04 | DRG: 916 | Disposition: A | Discharge status: Home / Self Care | Payer: Self-pay | Attending: Internal Medicine | Admitting: Internal Medicine

## 2015-08-02 DIAGNOSIS — I252 Old myocardial infarction: Secondary | ICD-10-CM

## 2015-08-02 DIAGNOSIS — N189 Chronic kidney disease, unspecified: Secondary | ICD-10-CM

## 2015-08-02 DIAGNOSIS — R1310 Dysphagia, unspecified: Secondary | ICD-10-CM | POA: Diagnosis present

## 2015-08-02 DIAGNOSIS — D841 Defects in the complement system: Secondary | ICD-10-CM | POA: Diagnosis present

## 2015-08-02 DIAGNOSIS — Z6841 Body Mass Index (BMI) 40.0 and over, adult: Secondary | ICD-10-CM

## 2015-08-02 DIAGNOSIS — Z8674 Personal history of sudden cardiac arrest: Secondary | ICD-10-CM

## 2015-08-02 DIAGNOSIS — Z7982 Long term (current) use of aspirin: Secondary | ICD-10-CM

## 2015-08-02 DIAGNOSIS — Z87891 Personal history of nicotine dependence: Secondary | ICD-10-CM

## 2015-08-02 DIAGNOSIS — T783XXA Angioneurotic edema, initial encounter: Principal | ICD-10-CM | POA: Diagnosis present

## 2015-08-02 DIAGNOSIS — D682 Hereditary deficiency of other clotting factors: Secondary | ICD-10-CM | POA: Diagnosis present

## 2015-08-02 DIAGNOSIS — Z95810 Presence of automatic (implantable) cardiac defibrillator: Secondary | ICD-10-CM

## 2015-08-02 DIAGNOSIS — I2510 Atherosclerotic heart disease of native coronary artery without angina pectoris: Secondary | ICD-10-CM | POA: Diagnosis present

## 2015-08-02 DIAGNOSIS — N179 Acute kidney failure, unspecified: Secondary | ICD-10-CM | POA: Diagnosis present

## 2015-08-02 DIAGNOSIS — E785 Hyperlipidemia, unspecified: Secondary | ICD-10-CM | POA: Diagnosis present

## 2015-08-02 DIAGNOSIS — I129 Hypertensive chronic kidney disease with stage 1 through stage 4 chronic kidney disease, or unspecified chronic kidney disease: Secondary | ICD-10-CM | POA: Diagnosis present

## 2015-08-02 DIAGNOSIS — T783XXD Angioneurotic edema, subsequent encounter: Secondary | ICD-10-CM

## 2015-08-02 DIAGNOSIS — E669 Obesity, unspecified: Secondary | ICD-10-CM | POA: Diagnosis present

## 2015-08-02 LAB — CBC WITH DIFFERENTIAL/PLATELET
Basophils Absolute: 0 10*3/uL (ref 0.0–0.1)
Basophils Relative: 0 % (ref 0–1)
Eosinophils Absolute: 0.2 10*3/uL (ref 0.0–0.7)
Eosinophils Relative: 3 % (ref 0–5)
HCT: 46.3 % (ref 39.0–52.0)
Hemoglobin: 15.4 g/dL (ref 13.0–17.0)
Lymphocytes Relative: 21 % (ref 12–46)
Lymphs Abs: 1.4 10*3/uL (ref 0.7–4.0)
MCH: 29.9 pg (ref 26.0–34.0)
MCHC: 33.3 g/dL (ref 30.0–36.0)
MCV: 89.9 fL (ref 78.0–100.0)
Monocytes Absolute: 0.4 10*3/uL (ref 0.1–1.0)
Monocytes Relative: 6 % (ref 3–12)
Neutro Abs: 4.4 10*3/uL (ref 1.7–7.7)
Neutrophils Relative %: 70 % (ref 43–77)
Platelets: 212 10*3/uL (ref 150–400)
RBC: 5.15 MIL/uL (ref 4.22–5.81)
RDW: 15.9 % — ABNORMAL HIGH (ref 11.5–15.5)
WBC: 6.3 10*3/uL (ref 4.0–10.5)

## 2015-08-02 LAB — HEPATIC FUNCTION PANEL
ALT: 27 U/L (ref 17–63)
AST: 33 U/L (ref 15–41)
Albumin: 2.9 g/dL — ABNORMAL LOW (ref 3.5–5.0)
Alkaline Phosphatase: 100 U/L (ref 38–126)
Bilirubin, Direct: 0.2 mg/dL (ref 0.1–0.5)
Indirect Bilirubin: 0.4 mg/dL (ref 0.3–0.9)
Total Bilirubin: 0.6 mg/dL (ref 0.3–1.2)
Total Protein: 5.8 g/dL — ABNORMAL LOW (ref 6.5–8.1)

## 2015-08-02 LAB — BASIC METABOLIC PANEL
Anion gap: 10 (ref 5–15)
BUN: 22 mg/dL — ABNORMAL HIGH (ref 6–20)
CO2: 24 mmol/L (ref 22–32)
Calcium: 7.7 mg/dL — ABNORMAL LOW (ref 8.9–10.3)
Chloride: 107 mmol/L (ref 101–111)
Creatinine, Ser: 2.3 mg/dL — ABNORMAL HIGH (ref 0.61–1.24)
GFR calc Af Amer: 36 mL/min — ABNORMAL LOW (ref >60)
GFR calc non Af Amer: 31 mL/min — ABNORMAL LOW (ref >60)
Glucose, Bld: 134 mg/dL — ABNORMAL HIGH (ref 65–99)
Potassium: 3.2 mmol/L — ABNORMAL LOW (ref 3.5–5.1)
Sodium: 141 mmol/L (ref 135–145)

## 2015-08-02 LAB — C-REACTIVE PROTEIN: CRP: 0.9 mg/dL (ref <1.0)

## 2015-08-02 LAB — SEDIMENTATION RATE: Sed Rate: 6 mm/hr (ref 0–16)

## 2015-08-02 MED ORDER — RACEPINEPHRINE HCL 2.25 % IN NEBU
0.5000 mL | INHALATION_SOLUTION | Freq: Once | RESPIRATORY_TRACT | Status: AC
  Administered 2015-08-02: 0.5 mL via RESPIRATORY_TRACT

## 2015-08-02 MED ORDER — SODIUM CHLORIDE 0.9 % IV SOLN: 250.0000 mL | INTRAVENOUS | Status: DC | PRN

## 2015-08-02 MED ORDER — METHYLPREDNISOLONE SODIUM SUCC 1000 MG IJ SOLR
250.0000 mg | Freq: Once | INTRAMUSCULAR | Status: AC
  Filled 2015-08-02: qty 2

## 2015-08-02 MED ORDER — METHYLPREDNISOLONE SODIUM SUCC 125 MG IJ SOLR
INTRAMUSCULAR | Status: AC
  Administered 2015-08-02: 250 mg
  Filled 2015-08-02: qty 4

## 2015-08-02 MED ORDER — DIPHENHYDRAMINE HCL 50 MG/ML IJ SOLN
INTRAMUSCULAR | Status: AC
  Administered 2015-08-02: 50 mg via INTRAVENOUS
  Filled 2015-08-02: qty 1

## 2015-08-02 MED ORDER — METHYLPREDNISOLONE SODIUM SUCC 125 MG IJ SOLR
125.0000 mg | Freq: Four times a day (QID) | INTRAMUSCULAR | Status: DC
  Administered 2015-08-03: 125 mg via INTRAVENOUS
  Filled 2015-08-02 (×5): qty 2

## 2015-08-02 MED ORDER — ENOXAPARIN SODIUM 80 MG/0.8ML ~~LOC~~ SOLN
70.0000 mg | SUBCUTANEOUS | Status: DC
Start: 2015-08-03 — End: 2015-08-04
  Filled 2015-08-02 (×2): qty 0.8

## 2015-08-02 MED ORDER — C1 ESTERASE INHIBITOR (HUMAN) 500 UNITS IV KIT
2000.0000 [IU] | PACK | Freq: Once | INTRAVENOUS | Status: AC
  Administered 2015-08-02: 2000 [IU] via INTRAVENOUS
  Filled 2015-08-02: qty 2000

## 2015-08-02 MED ORDER — SODIUM CHLORIDE 0.9 % IV SOLN
INTRAVENOUS | Status: DC
  Administered 2015-08-02: 23:00:00 via INTRAVENOUS

## 2015-08-02 MED ORDER — FAMOTIDINE IN NACL 20-0.9 MG/50ML-% IV SOLN
20.0000 mg | Freq: Once | INTRAVENOUS | Status: AC
  Administered 2015-08-02: 20 mg via INTRAVENOUS

## 2015-08-02 MED ORDER — ASPIRIN 81 MG PO CHEW: 324.0000 mg | CHEWABLE_TABLET | ORAL | Status: DC

## 2015-08-02 MED ORDER — RACEPINEPHRINE HCL 2.25 % IN NEBU
INHALATION_SOLUTION | RESPIRATORY_TRACT | Status: AC
  Administered 2015-08-02: 0.5 mL via RESPIRATORY_TRACT
  Filled 2015-08-02: qty 0.5

## 2015-08-02 MED ORDER — FAMOTIDINE IN NACL 20-0.9 MG/50ML-% IV SOLN
20.0000 mg | Freq: Two times a day (BID) | INTRAVENOUS | Status: DC
  Administered 2015-08-03: 20 mg via INTRAVENOUS
  Filled 2015-08-02 (×2): qty 50

## 2015-08-02 MED ORDER — DIPHENHYDRAMINE HCL 50 MG/ML IJ SOLN
50.0000 mg | Freq: Once | INTRAMUSCULAR | Status: AC
  Administered 2015-08-02: 50 mg via INTRAVENOUS

## 2015-08-02 MED ORDER — ASPIRIN 300 MG RE SUPP: 300.0000 mg | RECTAL | Status: DC

## 2015-08-02 NOTE — ED Notes (Signed)
Pt now able to speak however still very difficulty and muffled d/t oral swelling - pt reports onset of swelling was approx 21:05 and unknown allergen.

## 2015-08-02 NOTE — ED Provider Notes (Signed)
CSN: [id number]     Arrival date & time 08/02/15  2118 History   First MD Initiated Contact with Patient 08/02/15 2140     Chief Complaint  Patient presents with  . Angioedema     (Consider location/radiation/quality/duration/timing/severity/associated sxs/prior Treatment) Patient is a 54 y.o. male presenting with allergic reaction.  Allergic Reaction Presenting symptoms: difficulty breathing, difficulty swallowing and swelling   Severity:  Severe Prior allergic episodes:  Unable to specify Context: food   Relieved by:  None tried Worsened by:  Nothing tried Ineffective treatments:  None tried   Past Medical History  Diagnosis Date  . Coronary artery disease   . Hypertension   . Cardiac arrest    Past Surgical History  Procedure Laterality Date  . Implantable cardioverter defibrillator implant     History reviewed. No pertinent family history. Social History  Substance Use Topics  . Smoking status: Former Research scientist (life sciences)  . Smokeless tobacco: None  . Alcohol Use: No    Review of Systems  Unable to perform ROS: Acuity of condition  HENT: Positive for trouble swallowing.       Allergies  Lidocaine hcl; Lisinopril; and Penicillins  Home Medications   Prior to Admission medications   Medication Sig Start Date End Date Taking? Authorizing Provider  albuterol (PROVENTIL HFA;VENTOLIN HFA) 108 (90 BASE) MCG/ACT inhaler Inhale 2 puffs into the lungs every 4 (four) hours as needed for wheezing or shortness of breath. 01/14/15  Yes Janne Napoleon, NP  aspirin EC 81 MG tablet Take 81 mg by mouth daily.   Yes Historical Provider, MD  atorvastatin (LIPITOR) 40 MG tablet Take 40 mg by mouth daily.   Yes Historical Provider, MD  carvedilol (COREG) 25 MG tablet Take 25 mg by mouth 2 (two) times daily with a meal.   Yes Historical Provider, MD  EPINEPHrine 0.3 mg/0.3 mL IJ SOAJ injection Inject 0.3 mLs (0.3 mg total) into the muscle once. 04/30/15  Yes Veryl Speak, MD  furosemide (LASIX) 40  MG tablet Take 40 mg by mouth daily.   Yes Historical Provider, MD  hydrALAZINE (APRESOLINE) 25 MG tablet Take 25 mg by mouth 3 (three) times daily.   Yes Historical Provider, MD  hydrochlorothiazide (HYDRODIURIL) 25 MG tablet Take 25 mg by mouth daily.   Yes Historical Provider, MD  HYDROcodone-acetaminophen (NORCO) 7.5-325 MG per tablet Take 1 tablet by mouth every 6 (six) hours as needed for moderate pain.   Yes Historical Provider, MD  isosorbide dinitrate (ISORDIL) 20 MG tablet Take 20 mg by mouth 3 (three) times daily.   Yes Historical Provider, MD  potassium chloride 20 MEQ TBCR Take 20 mEq by mouth daily. 07/03/15  Yes Dorie Rank, MD  clindamycin (CLEOCIN) 300 MG capsule Take 1 capsule (300 mg total) by mouth 3 (three) times daily. Patient not taking: Reported on 04/30/2015 01/14/15   Janne Napoleon, NP  predniSONE (DELTASONE) 10 MG tablet Take 2 tablets (20 mg total) by mouth 2 (two) times daily. Patient not taking: Reported on 07/03/2015 04/30/15   Veryl Speak, MD  ranitidine (ZANTAC) 150 MG tablet Take 1 tablet (150 mg total) by mouth 2 (two) times daily. Patient not taking: Reported on 04/30/2015 12/18/14   Harden Mo, MD   BP 136/89 mmHg  Pulse 84  Temp(Src) 98.3 F (36.8 C) (Oral)  Resp 19  Wt 301 lb (136.533 kg)  SpO2 99% Physical Exam  Constitutional: He is oriented to person, place, and time. He appears well-developed and well-nourished. He appears  distressed.  HENT:  Head: Normocephalic and atraumatic.  Significant tongue swelling protruding from his mouth  Eyes: EOM are normal.  Neck: Normal range of motion.  Cardiovascular: Normal rate, regular rhythm and normal heart sounds.   No murmur heard. Pulmonary/Chest: Effort normal and breath sounds normal. No respiratory distress. He has no wheezes.  Abdominal: Soft. There is no tenderness.  Musculoskeletal: He exhibits edema.  Neurological: He is alert and oriented to person, place, and time.  Skin: No rash noted. He is not  diaphoretic.    ED Course  Procedures (including critical care time) Labs Review Labs Reviewed  CBC WITH DIFFERENTIAL/PLATELET - Abnormal; Notable for the following:    RDW 15.9 (*)    All other components within normal limits  BASIC METABOLIC PANEL - Abnormal; Notable for the following:    Potassium 3.2 (*)    Glucose, Bld 134 (*)    BUN 22 (*)    Creatinine, Ser 2.30 (*)    Calcium 7.7 (*)    GFR calc non Af Amer 31 (*)    GFR calc Af Amer 36 (*)    All other components within normal limits  HEPATIC FUNCTION PANEL - Abnormal; Notable for the following:    Total Protein 5.8 (*)    Albumin 2.9 (*)    All other components within normal limits  MRSA PCR SCREENING  SEDIMENTATION RATE  C-REACTIVE PROTEIN  C1 ESTERASE INHIBITOR PANEL  TRYPTASE  C4 COMPLEMENT  CBC  CREATININE, SERUM  CBC  BASIC METABOLIC PANEL  MAGNESIUM  PHOSPHORUS    Imaging Review No results found. I have personally reviewed and evaluated these images and lab results as part of my medical decision-making.   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 Patient also has a history of myocardial infarction status post AICD placement. Patient was drinking a sweet tea at [facility] tonight when he began experiencing hand and feet swelling that worsen to tongue swelling. Patient drove himself to the ED and on arrival patient had a significantly enlarged tongue that was protruding from his mouth. Patient was not in respiratory distress however was unable to speak. An EpiPen was administered to the patient immediately, racemic epinephrine was delivered, Benadryl and Zantac. ENT and anesthesia were emergently called. Patient had some improvement of symptoms following the EpiPen. Patient was given 250 mg of Solu-Medrol. Given the improvement of symptoms after  medications the decision was made not to intubate however the patient will go to the ICU for close monitoring. Patient was given Berinet.     Renne Musca, MD 08/02/15 [id number]  Pattricia Boss, MD 08/03/15 [id number]

## 2015-08-02 NOTE — Consult Note (Signed)
Reason for Consult:angioedema Referring Physician: er  Victor Thompson is an 54 y.o. male.  HPI: asked to see patient emergently for angioedema. He has had it four times since jan. He has not had any workup. He does not take ACE inb. He has been given predisone, epi, and H2 blocker. Berinert is not available.   Past Medical History  Diagnosis Date  . Coronary artery disease   . Hypertension   . Cardiac arrest     Past Surgical History  Procedure Laterality Date  . Implantable cardioverter defibrillator implant      History reviewed. No pertinent family history.  Social History:  reports that he has quit smoking. He does not have any smokeless tobacco history on file. He reports that he does not drink alcohol or use illicit drugs.  Allergies:  Allergies  Allergen Reactions  . Lidocaine Hcl     REACTION: tongue swelling  . Lisinopril     REACTION: tougne swelling.Pt reported problem with a BP med which sounded like  lisinopril  But as of 09/19/06,pt had tolerated altace without problem  . Penicillins     Cillin family per pt.  Mw,cma    Medications: I have reviewed the patient's current medications.  No results found for this or any previous visit (from the past 48 hour(s)).  No results found.  ROS Blood pressure 155/81, pulse 41, resp. rate 7, weight 136.533 kg (301 lb), SpO2 99 %. Physical Exam  HENT:  He is talking with dysarthia voice but his larynx does not sound like it has edema. No stridor. His tongue has decreased significantly since admission and seems to be going down by the minutes. Hands and tongue are edematous. He is comfortable.     Assessment/Plan: Angioedema- he seems to be dramatically improved and no airway issues. 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 We discussed Berinert but since he is improving I will leave that to pulmonary. He will be admitted to [facility]. No airway management for now.   Victor Thompson 08/02/2015, 10:05 PM

## 2015-08-02 NOTE — ED Notes (Signed)
Pt arrived to ED by private vehicle w/ severe angioedema, tongue is protruding from his mouth, pt unable to speak however pt is responsive and maintaining airway at this time.

## 2015-08-02 NOTE — H&P (Signed)
PULMONARY / CRITICAL CARE MEDICINE HISTORY AND PHYSICAL EXAMINATION   Name: Victor Thompson MRN: [id number] DOB: 1961-05-31    ADMISSION DATE:  08/02/2015  PRIMARY SERVICE: PCCM  CHIEF COMPLAINT:  Swollen tongue   BRIEF PATIENT DESCRIPTION: 54 y/o man with history of hereditary angioedema presenting with acute onset face and tongue swelling.  SIGNIFICANT EVENTS / STUDIES:  ENT, anesthesia, and PCCM evaluation in the ED. Elected to hold on elective intubation.  LINES / TUBES: PIV  CULTURES: None  ANTIBIOTICS: None  HISTORY OF PRESENT ILLNESS:   Victor Thompson is a 54 y/o man with a history most significant for what sounds like hereditary angioedema presenting with acute onset face and tongue swelling. He states that his symptoms began at 21:07 while at a fast food rest[employer]unt. He did not have an epi-pen with him, so he came directly to the ED. He states that he started having episodes like these with face swelling in 2013, and currently has 3-4 episodes per year. He has never had an episode this severe. He has not had a workup for these, but was under the impression that they were allergic in nature. He generally has an epi-pen and self-administers epinephrine, which usually causes his symptoms to resolve. He has not identified any triggers. He does not report any urticaria that accompanies his face swelling. No hx of recent ACE-inhibitor or ARB exposure.  Of note, he has a history of what sounds like sudden cardiac death and is s/p AICD insertion. He has mild systolic dysfunction (EF [id number]). It is not clear if that is related to his having received epinephrine injections.  In the ED, he was treated with steroids, antihistamines, nebulized racimic epinpheine,  was evaluated by anesthesia and ENT, and in conference, we elected to treat for acute C1-esterase inhibitor deficiency with C1-esterase inhibitor concentrate,   PAST MEDICAL HISTORY :  Past Medical History  Diagnosis Date  . Coronary  artery disease   . Hypertension   . Cardiac arrest    Past Surgical History  Procedure Laterality Date  . Implantable cardioverter defibrillator implant     Prior to Admission medications   Medication Sig Start Date End Date Taking? Authorizing Provider  albuterol (PROVENTIL HFA;VENTOLIN HFA) 108 (90 BASE) MCG/ACT inhaler Inhale 2 puffs into the lungs every 4 (four) hours as needed for wheezing or shortness of breath. 01/14/15  Yes Janne Napoleon, NP  aspirin EC 81 MG tablet Take 81 mg by mouth daily.   Yes Historical Provider, MD  atorvastatin (LIPITOR) 40 MG tablet Take 40 mg by mouth daily.   Yes Historical Provider, MD  carvedilol (COREG) 25 MG tablet Take 25 mg by mouth 2 (two) times daily with a meal.   Yes Historical Provider, MD  EPINEPHrine 0.3 mg/0.3 mL IJ SOAJ injection Inject 0.3 mLs (0.3 mg total) into the muscle once. 04/30/15  Yes Veryl Speak, MD  furosemide (LASIX) 40 MG tablet Take 40 mg by mouth daily.   Yes Historical Provider, MD  hydrALAZINE (APRESOLINE) 25 MG tablet Take 25 mg by mouth 3 (three) times daily.   Yes Historical Provider, MD  hydrochlorothiazide (HYDRODIURIL) 25 MG tablet Take 25 mg by mouth daily.   Yes Historical Provider, MD  HYDROcodone-acetaminophen (NORCO) 7.5-325 MG per tablet Take 1 tablet by mouth every 6 (six) hours as needed for moderate pain.   Yes Historical Provider, MD  isosorbide dinitrate (ISORDIL) 20 MG tablet Take 20 mg by mouth 3 (three) times daily.   Yes Historical Provider, MD  potassium chloride 20 MEQ TBCR Take 20 mEq by mouth daily. 07/03/15  Yes Dorie Rank, MD  clindamycin (CLEOCIN) 300 MG capsule Take 1 capsule (300 mg total) by mouth 3 (three) times daily. Patient not taking: Reported on 04/30/2015 01/14/15   Janne Napoleon, NP  predniSONE (DELTASONE) 10 MG tablet Take 2 tablets (20 mg total) by mouth 2 (two) times daily. Patient not taking: Reported on 07/03/2015 04/30/15   Veryl Speak, MD  ranitidine (ZANTAC) 150 MG tablet Take 1 tablet (150  mg total) by mouth 2 (two) times daily. Patient not taking: Reported on 04/30/2015 12/18/14   Harden Mo, MD   Allergies  Allergen Reactions  . Lidocaine Hcl     REACTION: tongue swelling  . Lisinopril     REACTION: tougne swelling.Pt reported problem with a BP med which sounded like  lisinopril  But as of 09/19/06,pt had tolerated altace without problem  . Penicillins     Cillin family per pt.  Mw,cma    FAMILY HISTORY:  History reviewed. No pertinent family history.  SOCIAL HISTORY:  reports that he has quit smoking. He does not have any smokeless tobacco history on file. He reports that he does not drink alcohol or use illicit drugs.  REVIEW OF SYSTEMS:  Per HPI  SUBJECTIVE: Reports improvement since arrival in ED and treatment.  VITAL SIGNS: Pulse Rate:  [41-92] 84 (08/27 2200) Resp:  [7-25] 19 (08/27 2200) BP: (136-157)/(81-109) 136/89 mmHg (08/27 2200) SpO2:  [99 %-100 %] 99 % (08/27 2200) FiO2 (%):  [100 %] 100 % (08/27 2124) Weight:  [301 lb (136.533 kg)] 301 lb (136.533 kg) (08/27 2159) HEMODYNAMICS:   VENTILATOR SETTINGS: Vent Mode:  [-]  FiO2 (%):  [100 %] 100 % INTAKE / OUTPUT: Intake/Output    None     PHYSICAL EXAMINATION: General:  Obese AAM breathing comfortably Neuro:  No deficits HEENT:  Swollen tongue, lips, and face, although able to retract tongue behind teeth and fully close mouth. No chemosis of the eyes. Firm but nontender tongue palpable under/behind the chin. No LAD. No fluctuance. Neck: Unable to assess for JVD given adiposity of neck Cardiovascular:  Heart sounds dual and normal. Lungs:  CTAB Abdomen:  Obese, nontender. Musculoskeletal:  Mild edema, no swollen joints Skin:  No rashes.  LABS:  CBC  Recent Labs Lab 08/02/15 2205  WBC 6.3  HGB 15.4  HCT 46.3  PLT 212   Coag's No results for input(s): APTT, INR in the last 168 hours. BMET No results for input(s): NA, K, CL, CO2, BUN, CREATININE, GLUCOSE in the last 168  hours. Electrolytes No results for input(s): CALCIUM, MG, PHOS in the last 168 hours. Sepsis Markers No results for input(s): LATICACIDVEN, PROCALCITON, O2SATVEN in the last 168 hours. ABG No results for input(s): PHART, PCO2ART, PO2ART in the last 168 hours. Liver Enzymes No results for input(s): AST, ALT, ALKPHOS, BILITOT, ALBUMIN in the last 168 hours. Cardiac Enzymes No results for input(s): TROPONINI, PROBNP in the last 168 hours. Glucose No results for input(s): GLUCAP in the last 168 hours.  Imaging No results found.  EKG: NSR CXR: Not obtained.  ASSESSMENT / PLAN:  Active Problems:   Angio-edema   PULMONARY A: Threatened airway P:   Discussed with anesthesia and ENT. Will treat medically and monitor given clear improvement on serial exams performed by anesthesia, and self-report of patient. Will treat with C1-esterase inhibitor concentrate, as well as continued steroids (solumedrol 125 mg q6 hrs). Plan for early  discontinuation of steroids once showing definite improvement.  CARDIOVASCULAR A: Hx of sudden cardiac death Systolic dysfunction P:   Will be cautious with beta-agonist such as epinephrine and limit use to avoid precipitating arrhythmias.  RENAL A: Hx of CKD ?stage P:   Monitor renal function. Avoid nephrotoxic drugs.   GASTROINTESTINAL A: No active issues.  P:   Maintain NPO until airway more definitely clear.  HEMATOLOGIC A: Probable hereditary angioedema due to C1-esterase inhibitor deficiency. P:   Will send up labs to w/u such as tryptase, C4 levels, and C1-activity panel.  INFECTIOUS A: No active issues P:    ENDOCRINE A: No active issues P:     NEUROLOGIC A: No active issues P:    BEST PRACTICE / DISPOSITION Level of Care:  ICU Primary Service:  PCCM Consultants:  ENT Code Status:  Full Diet:  NPO DVT Px:  Lovenox GI Px:  Pepcid Skin Integrity:  Intact Social / Family:  Updated on admission  TODAY'S SUMMARY:  54 y/o man with angioedema.  I have personally obtained a history, examined the patient, evaluated laboratory and imaging results, formulated the assessment and plan and placed orders.  CRITICAL CARE: The patient is critically ill with multiple organ systems failure and requires high complexity decision making for assessment and support, frequent evaluation and titration of therapies, application of advanced monitoring technologies and extensive interpretation of multiple databases. Critical Care Time devoted to patient care services described in this note is 95 minutes.   Luz Brazen, MD [facility] Pager: [phone]   08/02/2015, 10:47 PM

## 2015-08-02 NOTE — ED Notes (Signed)
Dr. Janace Hoard, ENT at bedside

## 2015-08-02 NOTE — ED Notes (Signed)
Pt given his own epi-pen by Angie Fava, RN per VO by Dr. Pattricia Boss, EDP

## 2015-08-03 DIAGNOSIS — T783XXD Angioneurotic edema, subsequent encounter: Secondary | ICD-10-CM

## 2015-08-03 DIAGNOSIS — N179 Acute kidney failure, unspecified: Secondary | ICD-10-CM

## 2015-08-03 DIAGNOSIS — N189 Chronic kidney disease, unspecified: Secondary | ICD-10-CM

## 2015-08-03 LAB — MRSA PCR SCREENING: MRSA by PCR: NEGATIVE

## 2015-08-03 LAB — PHOSPHORUS: Phosphorus: 2.4 mg/dL — ABNORMAL LOW (ref 2.5–4.6)

## 2015-08-03 LAB — BASIC METABOLIC PANEL
Anion gap: 10 (ref 5–15)
BUN: 24 mg/dL — ABNORMAL HIGH (ref 6–20)
CO2: 23 mmol/L (ref 22–32)
Calcium: 8.5 mg/dL — ABNORMAL LOW (ref 8.9–10.3)
Chloride: 108 mmol/L (ref 101–111)
Creatinine, Ser: 2.16 mg/dL — ABNORMAL HIGH (ref 0.61–1.24)
GFR calc Af Amer: 39 mL/min — ABNORMAL LOW (ref >60)
GFR calc non Af Amer: 34 mL/min — ABNORMAL LOW (ref >60)
Glucose, Bld: 145 mg/dL — ABNORMAL HIGH (ref 65–99)
Potassium: 3.8 mmol/L (ref 3.5–5.1)
Sodium: 141 mmol/L (ref 135–145)

## 2015-08-03 LAB — GLUCOSE, CAPILLARY: Glucose-Capillary: 140 mg/dL — ABNORMAL HIGH (ref 65–99)

## 2015-08-03 LAB — CBC
HCT: 47.9 % (ref 39.0–52.0)
Hemoglobin: 16.2 g/dL (ref 13.0–17.0)
MCH: 30.5 pg (ref 26.0–34.0)
MCHC: 33.8 g/dL (ref 30.0–36.0)
MCV: 90 fL (ref 78.0–100.0)
Platelets: 206 10*3/uL (ref 150–400)
RBC: 5.32 MIL/uL (ref 4.22–5.81)
RDW: 15.8 % — ABNORMAL HIGH (ref 11.5–15.5)
WBC: 7.7 10*3/uL (ref 4.0–10.5)

## 2015-08-03 LAB — MAGNESIUM: Magnesium: 1.9 mg/dL (ref 1.7–2.4)

## 2015-08-03 MED ORDER — CARVEDILOL 25 MG PO TABS
25.0000 mg | ORAL_TABLET | Freq: Two times a day (BID) | ORAL | Status: DC
  Administered 2015-08-04: 25 mg via ORAL
  Filled 2015-08-03: qty 1

## 2015-08-03 MED ORDER — HYDRALAZINE HCL 25 MG PO TABS
25.0000 mg | ORAL_TABLET | Freq: Three times a day (TID) | ORAL | Status: DC
  Administered 2015-08-03 – 2015-08-04 (×2): 25 mg via ORAL
  Filled 2015-08-03 (×2): qty 1

## 2015-08-03 MED ORDER — ASPIRIN EC 81 MG PO TBEC
81.0000 mg | DELAYED_RELEASE_TABLET | Freq: Every day | ORAL | Status: DC
  Administered 2015-08-03 – 2015-08-04 (×2): 81 mg via ORAL
  Filled 2015-08-03 (×2): qty 1

## 2015-08-03 MED ORDER — CETYLPYRIDINIUM CHLORIDE 0.05 % MT LIQD: 7.0000 mL | Freq: Two times a day (BID) | OROMUCOSAL | Status: DC

## 2015-08-03 MED ORDER — FAMOTIDINE 20 MG PO TABS
40.0000 mg | ORAL_TABLET | Freq: Every day | ORAL | Status: DC
  Administered 2015-08-03: 40 mg via ORAL
  Filled 2015-08-03: qty 2
  Filled 2015-08-03: qty 1

## 2015-08-03 MED ORDER — ACETAMINOPHEN 325 MG PO TABS
650.0000 mg | ORAL_TABLET | Freq: Four times a day (QID) | ORAL | Status: DC | PRN
  Administered 2015-08-03: 650 mg via ORAL
  Filled 2015-08-03: qty 2

## 2015-08-03 MED ORDER — PREDNISONE 20 MG PO TABS
60.0000 mg | ORAL_TABLET | Freq: Every day | ORAL | Status: DC
  Administered 2015-08-03 – 2015-08-04 (×2): 60 mg via ORAL
  Filled 2015-08-03 (×2): qty 1
  Filled 2015-08-03: qty 3

## 2015-08-03 MED ORDER — CHLORHEXIDINE GLUCONATE 0.12 % MT SOLN
15.0000 mL | Freq: Two times a day (BID) | OROMUCOSAL | Status: DC
  Administered 2015-08-03: 15 mL via OROMUCOSAL
  Filled 2015-08-03: qty 15

## 2015-08-03 NOTE — Progress Notes (Signed)
[facility] Progress Note [name] DOB: Oct 25, 1961 MRN: [id number]   Date of Service  08/03/2015  HPI/Events of Note  Multiple issues: 1. Patient c/o headache and 2. Needs home antihypertensives restarted. BP = 177/83.  eICU Interventions  Will order: 1.  Tylenol 650 mg PO Q 6 hours PRN HA. 2.  Restart home Coreg and Hydralazine.      Intervention Category Intermediate Interventions: Hypertension - evaluation and management;Pain - evaluation and management  Sommer,Steven Eugene 08/03/2015, 8:57 PM

## 2015-08-03 NOTE — Progress Notes (Signed)
Pt transferred to 5N02 per MD order. Report called to RN and pt transferred in wheelchair with personal belonging bag. RN was made aware that in pt belonging bag pt has wallet with money. However, at this time pt declines locking his valuables up. Receiving RN made aware of valuables.

## 2015-08-03 NOTE — Progress Notes (Signed)
Pt admitted to Rm 2M04 from ED. Pt stated some pain at oral mucosa d/t oral swelling but is able to communicate needs and pt's vital signs are stable.  No family members were present at the time and pt's belongings are at the bedside.   Will continue to monitor closely and notify the provider if condition is worsening.

## 2015-08-03 NOTE — Progress Notes (Signed)
PULMONARY / CRITICAL CARE MEDICINE HISTORY AND PHYSICAL EXAMINATION   Name: Victor Thompson MRN: [id number] DOB: 02/18/1961    ADMISSION DATE:  08/02/2015  PRIMARY SERVICE: PCCM  CHIEF COMPLAINT:  Swollen tongue   BRIEF PATIENT DESCRIPTION:  Mr. Dildine is a 54 y/o man with a history most significant for what sounds like hereditary angioedema presenting with acute onset face and tongue swelling. He states that his symptoms began at 21:07 while at a fast food resteraunt. He did not have an epi-pen with him, so he came directly to the ED. He states that he started having episodes like these with face swelling in 2013, and currently has 3-4 episodes per year. He has never had an episode this severe. He has not had a workup for these, but was under the impression that they were allergic in nature. He generally has an epi-pen and self-administers epinephrine, which usually causes his symptoms to resolve. He has not identified any triggers. He does not report any urticaria that accompanies his face swelling. No hx of recent ACE-inhibitor or ARB exposure.  Of note, he has a history of what sounds like sudden cardiac death and is s/p AICD insertion. He has mild systolic dysfunction (EF [id number]). It is not clear if that is related to his having received epinephrine injections.  In the ED, he was treated with steroids, antihistamines, nebulized racimic epinpheine,  was evaluated by anesthesia and ENT, and in conference, we elected to treat for acute C1-esterase inhibitor deficiency with C1-esterase inhibitor concentrate,    LINES / TUBES: PIV  CULTURES: None  ANTIBIOTICS: None   SIGNIFICANT EVENTS / STUDIES:  ENT, anesthesia, and PCCM evaluation in the ED. Elected to hold on elective intubation.    SUBJECTIVE/OVERNIGHT/INTERVAL HX 08/03/2015 : episode happened after drinking sweet tea yesterday. RN bedside rounds - reports much improved angioedema of fce. No resp distress. No stridor but hands  still swollen a bit. Hemodynamically stable. RN thinks he can eat. PAtient endorses hunger. He reports he feels he is ready to go home though UE still swollen.  Creat 2.3mg % and rising     VITAL SIGNS: Temp:  [97.6 F (36.4 C)-98.3 F (36.8 C)] 97.6 F (36.4 C) (08/28 0732) Pulse Rate:  [41-92] 75 (08/28 0700) Resp:  [0-25] 17 (08/28 0700) BP: (129-161)/(62-109) 152/75 mmHg (08/28 0700) SpO2:  [93 %-100 %] 96 % (08/28 0700) FiO2 (%):  [100 %] 100 % (08/27 2124) Weight:  [136 kg (299 lb 13.2 oz)-136.533 kg (301 lb)] 136 kg (299 lb 13.2 oz) (08/28 0500) HEMODYNAMICS:   VENTILATOR SETTINGS: Vent Mode:  [-]  FiO2 (%):  [100 %] 100 % INTAKE / OUTPUT: Intake/Output      08/27 0701 - 08/28 0700 08/28 0701 - 08/29 0700   I.V. (mL/kg) 83.7 (0.6)    IV Piggyback 50    Total Intake(mL/kg) 133.7 (1)    Urine (mL/kg/hr) 300    Total Output 300     Net -166.3            PHYSICAL EXAMINATION: General:  Obese AAM breathing comfortably Neuro:  No deficits. Walked well to mirror HEENT: Lower lip 2X size of upper lip - he looked at mirror - says this is baseline. No STridor. [place]ow ok.  Neck: Unable to assess for JVD given adiposity of neck Cardiovascular:  Heart sounds dual and normal. Lungs:  CTAB Abdomen:  Obese, nontender. Musculoskeletal:  Mild edema, no swollen joints Skin:  No rashes.  LABS: PULMONARY No results for  input(s): PHART, PCO2ART, PO2ART, HCO3, TCO2, O2SAT in the last 168 hours.  Invalid input(s): PCO2, PO2  CBC  Recent Labs Lab 08/02/15 2205  HGB 15.4  HCT 46.3  WBC 6.3  PLT 212    COAGULATION No results for input(s): INR in the last 168 hours.  CARDIAC  No results for input(s): TROPONINI in the last 168 hours. No results for input(s): PROBNP in the last 168 hours.   CHEMISTRY  Recent Labs Lab 08/02/15 2205  NA 141  K 3.2*  CL 107  CO2 24  GLUCOSE 134*  BUN 22*  CREATININE 2.30*  CALCIUM 7.7*   Estimated Creatinine Clearance: 50.6  mL/min (by C-G formula based on Cr of 2.3).   LIVER  Recent Labs Lab 08/02/15 2205  AST 33  ALT 27  ALKPHOS 100  BILITOT 0.6  PROT 5.8*  ALBUMIN 2.9*     INFECTIOUS No results for input(s): LATICACIDVEN, PROCALCITON in the last 168 hours.   ENDOCRINE CBG (last 3)   Recent Labs  08/02/15 2340  GLUCAP 140*         IMAGING x48h  - image(s) personally visualized  -   highlighted in bold No results found.      ASSESSMENT / PLAN:  Active Problems:   Angio-edema   Acute-on-chronic kidney injury   PULMONARY A: Threatened airway at admit 08/02/2015 due to angioedema following sweet   - much improved/nearly resolved, s/p Rx with C1 esterase concentrate P:   - change IV steroids (solumedrol 125 mg q6 hrs) to po prednisone - acontinue pepcide but change IV to po   CARDIOVASCULAR A: Hx of sudden cardiac death Systolic dysfunction   - no issues clinically P:   Will be cautious with beta-agonist such as epinephrine and limit use to avoid precipitating arrhythmias.  RENAL A: Hx of CKD ?stage - baseline creat 1.8mg % to 2mg %   - worse at admit to 2.3mG % P:   Await creat from 08/03/2015 Monitor renal function. Avoid nephrotoxic drugs.  Saline lock  GASTROINTESTINAL A: No active issues.  P:   Oral diet  HEMATOLOGIC A: Probable hereditary angioedema due to C1-esterase inhibitor deficiency. P:   Await  labs to w/u such as tryptase, C4 levels, and C1-activity panel.  INFECTIOUS A: No active issues P:    ENDOCRINE A: No active issues P:     NEUROLOGIC A: No active issues P:     SOCIAL A: $$$ issues + P - social work consult  BEST PRACTICE / DISPOSITION Level of Care:  ICU -> med surg Primary Service:  PCCM Consultants:  ENT Code Status:  Full Diet:  NPO DVT Px:  Lovenox GI Px:  Pepcid Skin Integrity:  Intact Social / Family:  Updated on admission  TODAY'S SUMMARY: 54 y/o man with angioedema. Much improved. He want to go  home. Advised to stay in floor through 08/04/15 due to angioedema on hands and rising creat. HE has reluctantly agreed. Aim for dc 08/04/15. Stays on PCCM because of anticipated dc     Dr. Brand Males, M.D., [facility]C.P Pulmonary and Critical Care Medicine Staff Physician [facility] Pager: [phone], If no answer or between  15:00h - 7:00h: call [phone]  08/03/2015 8:13 AM

## 2015-08-03 NOTE — Progress Notes (Signed)
Called phlebotomy team three times this morning to collect lab sample.  Spoken with [place] from central lab and she stated she will notify the team to come to collect.

## 2015-08-03 NOTE — Progress Notes (Signed)
At this time pt refusing Lovenox. Pt educated on benefits of Lovenox and why it is ordered. Pt noted verbally that he understood but still did not want to get Lovenox. Pt does have SCD's at the bedside that he was wearing earlier but is now currently up in the chair.

## 2015-08-04 LAB — C4 COMPLEMENT: Complement C4, Body Fluid: 26 mg/dL (ref 14–44)

## 2015-08-04 LAB — C1 ESTERASE INHIBITOR: C1INH SerPl-mCnc: 31 mg/dL (ref 21–39)

## 2015-08-04 MED ORDER — PREDNISONE 20 MG PO TABS: ORAL_TABLET | ORAL | Status: DC

## 2015-08-04 MED ORDER — PREDNISONE 20 MG PO TABS
ORAL_TABLET | ORAL | Status: DC
Start: 2015-08-04 — End: 2015-08-04

## 2015-08-04 MED ORDER — FAMOTIDINE 40 MG PO TABS: 40.0000 mg | ORAL_TABLET | Freq: Every day | ORAL | Status: DC

## 2015-08-04 NOTE — Progress Notes (Addendum)
S: feels well wants to go home  O Non focal exam Filed Vitals:   08/03/15 0800 08/03/15 1015 08/03/15 1952 08/04/15 0453  BP: 145/74 160/85 177/83 148/94  Pulse: 75 77 94 86  Temp:   98.1 F (36.7 C) 98.2 F (36.8 C)  TempSrc:   Oral Oral  Resp: 27 18 18 18   Height:      Weight:    136.623 kg (301 lb 3.2 oz)  SpO2: 96% 98% 99% 95%    PULMONARY No results for input(s): PHART, PCO2ART, PO2ART, HCO3, TCO2, O2SAT in the last 168 hours.  Invalid input(s): PCO2, PO2  CBC  Recent Labs Lab 08/02/15 2205 08/03/15 0749  HGB 15.4 16.2  HCT 46.3 47.9  WBC 6.3 7.7  PLT 212 206    COAGULATION No results for input(s): INR in the last 168 hours.  CARDIAC  No results for input(s): TROPONINI in the last 168 hours. No results for input(s): PROBNP in the last 168 hours.   CHEMISTRY  Recent Labs Lab 08/02/15 2205 08/03/15 0749  NA 141 141  K 3.2* 3.8  CL 107 108  CO2 24 23  GLUCOSE 134* 145*  BUN 22* 24*  CREATININE 2.30* 2.16*  CALCIUM 7.7* 8.5*  MG  --  1.9  PHOS  --  2.4*   Estimated Creatinine Clearance: 54 mL/min (by C-G formula based on Cr of 2.16).   LIVER  Recent Labs Lab 08/02/15 2205  AST 33  ALT 27  ALKPHOS 100  BILITOT 0.6  PROT 5.8*  ALBUMIN 2.9*     INFECTIOUS No results for input(s): LATICACIDVEN, PROCALCITON in the last 168 hours.   ENDOCRINE CBG (last 3)   Recent Labs  08/02/15 2340  GLUCAP 140*         IMAGING x48h  - image(s) personally visualized  -   highlighted in bold No results found.     A) Angioedema - resolved AKI on CKD - improved  P Dc h ome  Needs allergist opd eval - ? Dr Annamaria Boots Needs opd renal   Dr. Brand Males, M.D., [facility].C.P Pulmonary and Critical Care Medicine Staff Physician [facility] Pager: [phone], If no answer or between  15:00h - 7:00h: call [phone]  08/04/2015 8:24 AM

## 2015-08-04 NOTE — Progress Notes (Signed)
Patient discharged home, picked up by friend.

## 2015-08-04 NOTE — Discharge Summary (Signed)
Physician Discharge Summary  Patient ID: Victor Thompson MRN: [id number] DOB/AGE: 1960/12/15 54 y.o.  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 Doylene Canard (PCP) Continue Epi-Pen PRN swelling Will need allergy work up at some point to rule out allergic component to angioedema  Hx of Sudden Cardiac Death Hypertension Hyperlipidemia  Systolic Dysfunction   Discharge Plan: ASA 81 mg QD  Continue lipitor, carvedilol, hydralazine & imdur HOLD home lasix, HCTZ, KCL until seen by PCP with follow up BMP  Heart healthy, low sodium diet at discharge  Acute on Chronic Kidney Disease  Discharge Plan: Follow up BMP with PCP  Pt will need referral to Nephrology for renal follow up                  Victor Thompson is a 54 y.o. y/o male with a PMH of HTN, HLD, sudden cardiac death s/p AICD insertion, mild systolic dysfunction (EF 62-83%) and what sounds like hereditary angioedema who presented to [facility] on 08/02/15 with acute onset face and tongue swelling. He reported symptoms began at 21:07 while at a fast food resteraunt. He did not have an epi-pen with him, so he came directly to the ED for evaluation.  He started having episodes with face swelling in 2013, and currently has 3-4 episodes per year. He has never had an episode this severe. He has not had a workup for these, but was under the impression that they were allergic in nature. He generally has  an epi-pen and self-administers epinephrine, which usually causes his symptoms to resolve. He has not identified any triggers. He does not report any urticaria that accompanies his face swelling. No hx of recent ACE-inhibitor or ARB exposure.  In the ED, he was treated with steroids, antihistamines, nebulized racimic epinpheine, was evaluated by anesthesia and ENT, and in conference, it was elected to treat for acute C1-esterase inhibitor deficiency with C1-esterase inhibitor concentrate. The patient was admitted for further evaluation.  He had significant improvement in symptoms.  Hospital course complicated by acute on chronic renal failure with serum creatinine rising to 2.3.  Home blood pressure regimen was restarted prior to discharge.  Home lasix and HCTZ were held due to AKI.      SIGNIFICANT EVENTS:  8/27  Admit with Angioedema  8/27  Home BP medications resumed, sr cr 2.30 8/28  Sr cr decreased to 2.16, anxious to go home.   CONSULTS ENT Anesthesia  PCCM    Discharge Exam: General: obese male in NAD Neuro: AAOx4, speech clear CV: s1s2 rrr, no m/r/g PULM: resp's even/non-labored, lungs bilaterally clear, no angioedema, no stridor, swallowing normally  GI: obese, non-tender, BSx4 active  Extremities: warm/dry,  no edema, no joint swelling Skin: no rashes   Filed Vitals:   08/03/15 0800 08/03/15 1015 08/03/15 1952 08/04/15 0453  BP: 145/74 160/85 177/83 148/94  Pulse: 75 77 94 86  Temp:   98.1 F (36.7 C) 98.2 F (36.8 C)  TempSrc:   Oral Oral  Resp: _0 Height:      Weight:    301 lb 3.2 oz (136.623 kg)  SpO2: 96% 98% 99% 95%     Discharge Labs  BMET  Recent Labs Lab 08/02/15 2205 08/03/15 0749  NA 141 141  K 3.2* 3.8  CL 107 108  CO2 24 23  GLUCOSE 134* 145*  BUN 22* 24*  CREATININE 2.30* 2.16*  CALCIUM 7.7* 8.5*  MG  --  1.9  PHOS  --  2.4*   CBC  Recent Labs Lab 08/02/15 2205 08/03/15 0749  HGB 15.4 16.2  HCT 46.3 47.9  WBC 6.3  7.7  PLT 212 206    Discharge Instructions    Call MD for:  difficulty breathing, headache or visual disturbances    Complete by:  As directed      Call MD for:  extreme fatigue    Complete by:  As directed      Call MD for:  hives    Complete by:  As directed      Call MD for:  persistant dizziness or light-headedness    Complete by:  As directed      Call MD for:  persistant nausea and vomiting    Complete by:  As directed      Call MD for:  severe uncontrolled pain    Complete by:  As directed      Call MD for:  temperature >100.4    Complete by:  As directed      Call MD for:    Complete by:  As directed   Increased lower extremity swelling     Diet - low sodium heart healthy    Complete by:  As directed      Discharge instructions    Complete by:  As directed   1. Follow up with Dr. Braxton Feathers for lab work and BP review 2. Review your medications carefully  3. Call for an appointment for allergy testing with Dr. Annamaria Boots and Nephrology follow up     Increase activity slowly    Complete by:  As directed                 Follow-up Information    Follow up with [facility], MD On 08/12/2015.   Specialty:  Cardiology   Why:  Appt at 11:30 with a BMP (lab draw)   Contact information:   [place] [address] [phone]       Follow up with [facility].   Why:  Call for an appointment or Dr. Doylene Canard can refer you   Contact information:   [place] [address] [phone]       Follow up with Deneise Lever, MD.   Specialty:  Pulmonary Disease   Why:  Call for an appointment for allergy testing.    Contact information:   [place] [address] [phone]          Medication List    STOP taking these medications        clindamycin 300 MG capsule  Commonly known as:  CLEOCIN     furosemide  40 MG tablet  Commonly known as:  LASIX     hydrochlorothiazide 25 MG tablet  Commonly known as:  HYDRODIURIL      Potassium Chloride ER 20 MEQ Tbcr     ranitidine 150 MG tablet  Commonly known as:  ZANTAC      TAKE these medications        albuterol 108 (90 BASE) MCG/ACT inhaler  Commonly known as:  PROVENTIL HFA;VENTOLIN HFA  Inhale 2 puffs into the lungs every 4 (four) hours as needed for wheezing or shortness of breath.     aspirin EC 81 MG tablet  Take 81 mg by mouth daily.     atorvastatin 40 MG tablet  Commonly known as:  LIPITOR  Take 40 mg by mouth daily.     carvedilol 25 MG tablet  Commonly known as:  COREG  Take 25 mg by mouth 2 (two) times daily with a meal.     EPINEPHrine 0.3 mg/0.3 mL Soaj injection  Commonly known as:  EPI-PEN  Inject 0.3 mLs (0.3 mg total) into the muscle once.     hydrALAZINE 25 MG tablet  Commonly known as:  APRESOLINE  Take 25 mg by mouth 3 (three) times daily.     HYDROcodone-acetaminophen 7.5-325 MG per tablet  Commonly known as:  NORCO  Take 1 tablet by mouth every 6 (six) hours as needed for moderate pain.     isosorbide dinitrate 20 MG tablet  Commonly known as:  ISORDIL  Take 20 mg by mouth 3 (three) times daily.     predniSONE 20 MG tablet  Commonly known as:  DELTASONE  2 tabs daily for 2 days, then 1 tab daily for 2 days, then 1/2 tab daily for 4 days         Disposition:  Home.  No new home health needs identified prior to dischareg.   Discharged Condition: Saivon Prowse has met maximum benefit of inpatient care and is medically stable and cleared for discharge.  Patient is pending follow up as above.      Time spent on disposition:  Greater than 35 minutes.   Signed: Noe Gens, NP-C [facility] Pgr: [phone] Office: [phone]

## 2015-08-04 NOTE — Clinical Documentation Improvement (Signed)
Hospitalist Critical Care  Can the diagnosis of CKD be further specified?   CKD Stage I - GFR greater than or equal to 90  CKD Stage II - GFR 60-89  CKD Stage III - GFR 30-59  CKD Stage IV - GFR 15-29  CKD Stage V - GFR < 15  ESRD (End Stage Renal Disease)  Other condition  Unable to clinically determine  Supporting Information: : (risk factors, signs and symptoms, diagnostics, treatment)  Black male  GFR's ranging from 95, 70 for current admission; 42 one month ago; 48 three months ago  Please exercise your independent, professional judgment when responding. A specific answer is not anticipated or expected.  Thank You, [place] [phone]

## 2015-08-05 LAB — TRYPTASE: Tryptase: 16.6 ug/L — ABNORMAL HIGH (ref <11)

## 2015-09-03 ENCOUNTER — Other Ambulatory Visit (INDEPENDENT_AMBULATORY_CARE_PROVIDER_SITE_OTHER): Payer: No Typology Code available for payment source

## 2015-09-03 ENCOUNTER — Ambulatory Visit (INDEPENDENT_AMBULATORY_CARE_PROVIDER_SITE_OTHER): Payer: No Typology Code available for payment source | Admitting: Internal Medicine

## 2015-09-03 ENCOUNTER — Encounter: Payer: Self-pay | Admitting: Internal Medicine

## 2015-09-03 VITALS — BP 140/88 | HR 99 | Ht 67.0 in | Wt 298.6 lb

## 2015-09-03 DIAGNOSIS — N259 Disorder resulting from impaired renal tubular function, unspecified: Secondary | ICD-10-CM

## 2015-09-03 DIAGNOSIS — F17200 Nicotine dependence, unspecified, uncomplicated: Secondary | ICD-10-CM

## 2015-09-03 DIAGNOSIS — T783XXA Angioneurotic edema, initial encounter: Secondary | ICD-10-CM

## 2015-09-03 DIAGNOSIS — Z720 Tobacco use: Secondary | ICD-10-CM

## 2015-09-03 DIAGNOSIS — G4733 Obstructive sleep apnea (adult) (pediatric): Secondary | ICD-10-CM

## 2015-09-03 LAB — SEDIMENTATION RATE: Sed Rate: 29 mm/hr — ABNORMAL HIGH (ref 0–22)

## 2015-09-03 LAB — CBC WITH DIFFERENTIAL/PLATELET
Basophils Absolute: 0 10*3/uL (ref 0.0–0.1)
Basophils Relative: 0.3 % (ref 0.0–3.0)
Eosinophils Absolute: 0.1 10*3/uL (ref 0.0–0.7)
Eosinophils Relative: 2.8 % (ref 0.0–5.0)
HCT: 43.3 % (ref 39.0–52.0)
Hemoglobin: 14.4 g/dL (ref 13.0–17.0)
Lymphocytes Relative: 17.3 % (ref 12.0–46.0)
Lymphs Abs: 0.9 10*3/uL (ref 0.7–4.0)
MCHC: 33.2 g/dL (ref 30.0–36.0)
MCV: 90.1 fl (ref 78.0–100.0)
Monocytes Absolute: 0.7 10*3/uL (ref 0.1–1.0)
Monocytes Relative: 12.9 % — ABNORMAL HIGH (ref 3.0–12.0)
Neutro Abs: 3.4 10*3/uL (ref 1.4–7.7)
Neutrophils Relative %: 66.7 % (ref 43.0–77.0)
Platelets: 209 10*3/uL (ref 150.0–400.0)
RBC: 4.8 Mil/uL (ref 4.22–5.81)
RDW: 16.1 % — ABNORMAL HIGH (ref 11.5–15.5)
WBC: 5.1 10*3/uL (ref 4.0–10.5)

## 2015-09-03 LAB — C-REACTIVE PROTEIN: CRP: 0.5 mg/dL (ref 0.5–20.0)

## 2015-09-03 MED ORDER — EPINEPHRINE 0.3 MG/0.3ML IJ SOAJ: 0.3000 mg | Freq: Once | INTRAMUSCULAR | Status: DC

## 2015-09-03 NOTE — Assessment & Plan Note (Signed)
We look briefly at recent chemistry results. He will discuss management needs with his cardiologist for referral to nephrology if appropriate.

## 2015-09-03 NOTE — Patient Instructions (Signed)
Order- lab-    Allergy profile, Food IgE profile, alpha-gal IgE, Sed rate, C-reactive protein, CBC w diff, Angioedema panel  Dx angioedema  Recommend- daily H1 antihistamine (either Claritin/loratadine, Allegra/ fexofenadine, or Zyrtec/ cetirizine)                 And  Daily H2 antihistamine (either pepcid, tagamet or zantac)   Script for generic epipen to use if really necessary

## 2015-09-03 NOTE — Assessment & Plan Note (Signed)
Remote sleep studies probably done at [facility] and at [facility] neurologic with reports not currently available. He does not have a working CPAP machine. This issue will need to be addressed in the future and may require an updated sleep study.

## 2015-09-03 NOTE — Progress Notes (Signed)
09/03/15- 24 yoM former smoker hosp f/u. Pt was seen for acute angioedema. Pt states swelling has resolved. Pt c/o SOB with exertion. Pt c/o of LE edema for several weeks. Hx ICM, CAD, OSA, CHF, Asthma From DC summary: He started having episodes with face swelling in 2013, and currently has 3-4 episodes per year. He has never had an episode this severe. He has not had a workup for these, but was under the impression that they were allergic in nature. He generally has an epi-pen and self-administers epinephrine, which usually causes his symptoms to resolve. He has not identified any triggers. He does not report any urticaria that accompanies his face swelling. No hx of recent ACE-inhibitor or ARB exposure. In the ED, he was treated with steroids, antihistamines, nebulized racimic epinpheine, was evaluated by anesthesia and ENT, and in conference, it was elected to treat for acute C1-esterase inhibitor deficiency with C1-esterase inhibitor concentrate( [place]in[place]). The patient was admitted for further evaluation. He had significant improvement in symptoms. Hospital course complicated by acute on chronic renal failure with serum creatinine rising to 2.3. Home blood pressure regimen was restarted prior to discharge. Home lasix and HCTZ were held due to AKI. Lab: C1 estrerase inhibitor level  31( 21-39) on 8/27; Tryptase 16.6 (H); sed rate, CRP, C4 complement were Nl, LFTs w/o enzyme elevation Dr Doylene Canard is cardiologist-recent appointment with labs drawn. The histrionic and is that this gentleman began experiencing angioedema while incarcerated in 2013. Initial episodes were happening every few weeks but subsequently have been perennial, about once a month. He is going to the emergency room several times. He has used an EpiPen which does accelerate clearing, but is currently on affordable. He has not recognized an obvious trigger and has not been on Ace inhibitors or ARBs in a very long time. No recognized problem  with latex, aspirin, specific foods. He denies history of environmental allergy/rhinitis/asthma. He has had previous reactions to lidocaine, lisinopril, penicillins. He denies any similar family history. He has a diagnosis of obstructive sleep apnea with at least 2 previous sleep studies elsewhere. Currently no DME company and his CPAP machine does not work and has not been used in a long time.  Prior to Admission medications   Medication Sig Start Date End Date Taking? Authorizing Provider  albuterol (PROVENTIL HFA;VENTOLIN HFA) 108 (90 BASE) MCG/ACT inhaler Inhale 2 puffs into the lungs every 4 (four) hours as needed for wheezing or shortness of breath. 01/14/15  Yes Janne Napoleon, NP  aspirin EC 81 MG tablet Take 81 mg by mouth daily.   Yes Historical Provider, MD  atorvastatin (LIPITOR) 40 MG tablet Take 40 mg by mouth daily.   Yes Historical Provider, MD  carvedilol (COREG) 25 MG tablet Take 25 mg by mouth 2 (two) times daily with a meal.   Yes Historical Provider, MD  EPINEPHrine 0.3 mg/0.3 mL IJ SOAJ injection Inject 0.3 mLs (0.3 mg total) into the muscle once. 09/03/15  Yes Deneise Lever, MD  furosemide (LASIX) 40 MG tablet Take 1 tablet by mouth daily. 06/25/15  Yes Historical Provider, MD  hydrALAZINE (APRESOLINE) 25 MG tablet Take 25 mg by mouth 3 (three) times daily.   Yes Historical Provider, MD  HYDROcodone-acetaminophen (NORCO) 7.5-325 MG per tablet Take 1 tablet by mouth every 6 (six) hours as needed for moderate pain.   Yes Historical Provider, MD  isosorbide dinitrate (ISORDIL) 20 MG tablet Take 20 mg by mouth 3 (three) times daily.   Yes Historical Provider, MD  naproxen (NAPROSYN) 500 MG tablet Take 1 tablet by mouth 2 (two) times daily. WITH FOOD AS DIRECTED 06/25/15  Yes Historical Provider, MD   Past Medical History  Diagnosis Date  . Coronary artery disease   . Hypertension   . Cardiac arrest    Past Surgical History  Procedure Laterality Date  . Implantable cardioverter  defibrillator implant     No family history on file. Social History   Social History  . Marital Status: Single    Spouse Name: N/A  . Number of Children: N/A  . Years of Education: N/A   Occupational History  . Not on file.   Social History Main Topics  . Smoking status: Former Research scientist (life sciences)  . Smokeless tobacco: Not on file  . Alcohol Use: No  . Drug Use: No  . Sexual Activity: Not on file   Other Topics Concern  . Not on file   Social History Narrative   ROS-see HPI   Negative unless "+" Constitutional:    weight loss, night sweats, fevers, chills, fatigue, lassitude. HEENT:    headaches, difficulty swallowing, tooth/dental problems, sore throat,       sneezing, itching, ear ache, nasal congestion, post nasal drip, snoring CV:    chest pain, orthopnea, PND, swelling in lower extremities, anasarca,                                                        dizziness, palpitations Resp:   shortness of breath with exertion or at rest.                productive cough,   non-productive cough, coughing up of blood.              change in color of mucus.  wheezing.   Skin:    +HPI GI:  No-   heartburn, indigestion, abdominal pain, nausea, vomiting, diarrhea,                 change in bowel habits, loss of appetite GU: dysuria, change in color of urine, no urgency or frequency.   flank pain. MS:   joint pain, stiffness, decreased range of motion, back pain. Neuro-     nothing unusual Psych:  change in mood or affect.  depression or anxiety.   memory loss.  OBJ- Physical Exam General- Alert, Oriented, Affect-appropriate, Distress- none acute, + obese Skin- rash-none, lesions- none, excoriation- none Lymphadenopathy- none Head- atraumatic            Eyes- Gross vision intact, PERRLA, conjunctivae and secretions clear            Ears- Hearing, canals-normal            Nose- Clear, no-Septal dev, mucus, polyps, erosion, perforation             Throat- Mallampati IV , mucosa clear ,  drainage- none, tonsils- atrophic Neck- flexible , trachea midline, no stridor , thyroid nl, carotid no bruit Chest - symmetrical excursion , unlabored           Heart/CV- RRR , no murmur , no gallop  , no rub, nl s1 s2                           - JVD- none ,  edema- none, stasis changes- none, varices- none           Lung- clear to P&A, wheeze- none, cough- none , dullness-none, rub- none           Chest wall-  Abd-  Br/ Gen/ Rectal- Not done, not indicated Extrem- cyanosis- none, clubbing, none, atrophy- none, strength- nl Neuro- grossly intact to observation

## 2015-09-03 NOTE — Assessment & Plan Note (Signed)
Adult onset in 2013 with perennial recurrent pattern since then and no obvious specific trigger. No family history. No background of obvious atopic allergy. He was treated with a C1 esterase inhibitor at the hospital but not clear if that contributed to his improvement. Usually he responds to EpiPen, steroids and Benadryl. Plan-maintenance suppression with H1 and H2 antihistamines. Labs for allergy profile, food IgE, alpha gal, sedimentation rate, angioedema panel, CBC with differential, all intended to recheck results obtained during acute illness.

## 2015-09-03 NOTE — Assessment & Plan Note (Signed)
He identifies himself as a former smoker and is encouraged to remain abstinent.

## 2015-09-04 LAB — ALLERGEN FOOD PROFILE SPECIFIC IGE
Apple: 0.14 kU/L — ABNORMAL HIGH
Chicken IgE: <0.1 kU/L
Corn: 4.41 kU/L — ABNORMAL HIGH
Egg White IgE: <0.1 kU/L
Fish Cod: <0.1 kU/L
IgE (Immunoglobulin E), Serum: 151 kU/L — ABNORMAL HIGH (ref <115)
Milk IgE: <0.1 kU/L
Orange: 0.14 kU/L — ABNORMAL HIGH
Peanut IgE: 0.16 kU/L — ABNORMAL HIGH
Shrimp IgE: <0.1 kU/L
Soybean IgE: 0.13 kU/L — ABNORMAL HIGH
Tomato IgE: 0.26 kU/L — ABNORMAL HIGH
Tuna IgE: <0.1 kU/L
Wheat IgE: 0.48 kU/L — ABNORMAL HIGH

## 2015-09-04 LAB — ALLERGY FULL PROFILE
Allergen, D pternoyssinus,d7: 2.63 kU/L — ABNORMAL HIGH
Allergen,Goose feathers, e70: 0.19 kU/L — ABNORMAL HIGH
Alternaria Alternata: <0.1 kU/L
Aspergillus fumigatus, m3: <0.1 kU/L
Bahia Grass: 0.53 kU/L — ABNORMAL HIGH
Bermuda Grass: 0.27 kU/L — ABNORMAL HIGH
Box Elder IgE: 0.19 kU/L — ABNORMAL HIGH
Candida Albicans: <0.1 kU/L
Cat Dander: <0.1 kU/L
Common Ragweed: 0.97 kU/L — ABNORMAL HIGH
Curvularia lunata: <0.1 kU/L
D. farinae: 6.24 kU/L — ABNORMAL HIGH
Dog Dander: 0.25 kU/L — ABNORMAL HIGH
Elm IgE: 0.18 kU/L — ABNORMAL HIGH
Fescue: 1.81 kU/L — ABNORMAL HIGH
G005 Rye, Perennial: 1.69 kU/L — ABNORMAL HIGH
G009 Red Top: 3.06 kU/L — ABNORMAL HIGH
Goldenrod: 0.45 kU/L — ABNORMAL HIGH
Helminthosporium halodes: <0.1 kU/L
House Dust Hollister: 0.43 kU/L — ABNORMAL HIGH
Lamb's Quarters: 0.78 kU/L — ABNORMAL HIGH
Oak: 0.16 kU/L — ABNORMAL HIGH
Plantain: 0.29 kU/L — ABNORMAL HIGH
Stemphylium Botryosum: <0.1 kU/L
Sycamore Tree: 0.27 kU/L — ABNORMAL HIGH
Timothy Grass: 1.72 kU/L — ABNORMAL HIGH

## 2015-09-04 LAB — C1 ESTERASE INHIBITOR: C1INH SerPl-mCnc: 37 mg/dL (ref 21–39)

## 2015-09-05 LAB — C4 COMPLEMENT: C4 Complement: 31 mg/dL (ref 10–40)

## 2015-09-07 LAB — C1 ESTERASE INHIBITOR, FUNCTIONAL: C1INH Functional/C1INH Total MFr SerPl: >100 % (ref >=68)

## 2015-09-08 LAB — ALPHA GAL IGE: Alpha Gal IgE*: <0.1 kU/L (ref <0.35)

## 2015-09-09 LAB — COMPLEMENT COMPONENT C1Q: Complement C1Q: 8.6 mg/dL (ref 5.0–8.6)

## 2015-11-23 ENCOUNTER — Inpatient Hospital Stay (HOSPITAL_COMMUNITY)
Admission: EM | Admit: 2015-11-23 | Discharge: 2015-11-26 | DRG: 291 | Disposition: A | Discharge status: Home / Self Care | Payer: Self-pay | Attending: Cardiovascular Disease | Admitting: Cardiovascular Disease

## 2015-11-23 ENCOUNTER — Encounter (HOSPITAL_COMMUNITY): Payer: Self-pay

## 2015-11-23 DIAGNOSIS — N182 Chronic kidney disease, stage 2 (mild): Secondary | ICD-10-CM | POA: Diagnosis present

## 2015-11-23 DIAGNOSIS — I248 Other forms of acute ischemic heart disease: Secondary | ICD-10-CM | POA: Diagnosis present

## 2015-11-23 DIAGNOSIS — I5021 Acute systolic (congestive) heart failure: Secondary | ICD-10-CM | POA: Diagnosis present

## 2015-11-23 DIAGNOSIS — Z95810 Presence of automatic (implantable) cardiac defibrillator: Secondary | ICD-10-CM

## 2015-11-23 DIAGNOSIS — Z8674 Personal history of sudden cardiac arrest: Secondary | ICD-10-CM

## 2015-11-23 DIAGNOSIS — I5023 Acute on chronic systolic (congestive) heart failure: Secondary | ICD-10-CM | POA: Diagnosis present

## 2015-11-23 DIAGNOSIS — Z955 Presence of coronary angioplasty implant and graft: Secondary | ICD-10-CM

## 2015-11-23 DIAGNOSIS — Z87891 Personal history of nicotine dependence: Secondary | ICD-10-CM

## 2015-11-23 DIAGNOSIS — I110 Hypertensive heart disease with heart failure: Principal | ICD-10-CM | POA: Diagnosis present

## 2015-11-23 DIAGNOSIS — J9601 Acute respiratory failure with hypoxia: Secondary | ICD-10-CM | POA: Diagnosis present

## 2015-11-23 DIAGNOSIS — Z79899 Other long term (current) drug therapy: Secondary | ICD-10-CM

## 2015-11-23 DIAGNOSIS — I2510 Atherosclerotic heart disease of native coronary artery without angina pectoris: Secondary | ICD-10-CM | POA: Diagnosis present

## 2015-11-23 DIAGNOSIS — J810 Acute pulmonary edema: Secondary | ICD-10-CM

## 2015-11-23 DIAGNOSIS — I161 Hypertensive emergency: Secondary | ICD-10-CM

## 2015-11-23 DIAGNOSIS — Z6841 Body Mass Index (BMI) 40.0 and over, adult: Secondary | ICD-10-CM

## 2015-11-23 DIAGNOSIS — Z7982 Long term (current) use of aspirin: Secondary | ICD-10-CM

## 2015-11-23 HISTORY — DX: Heart failure, unspecified: I50.9

## 2015-11-23 HISTORY — DX: Reserved for inherently not codable concepts without codable children: IMO0001

## 2015-11-23 HISTORY — DX: Presence of automatic (implantable) cardiac defibrillator: Z95.810

## 2015-11-23 HISTORY — DX: Sleep apnea, unspecified: G47.30

## 2015-11-23 HISTORY — DX: Chronic pulmonary edema: J81.1

## 2015-11-23 NOTE — ED Notes (Signed)
Pt complains of shortness of breath that started this evening. Pt states that he is coughing up blood as well. Pt states it is better to breath while sitting up.

## 2015-11-23 NOTE — ED Provider Notes (Signed)
CSN: [id number]     Arrival date & time 11/23/15  2339 History  By signing my name below, I, Altamease Oiler, attest that this documentation has been prepared under the direction and in the presence of Varney Biles, MD. Electronically Signed: Altamease Oiler, ED Scribe. 11/24/2015. 1:55 AM    Chief Complaint  Patient presents with  . Shortness of Breath   The history is provided by the patient. No language interpreter was used.   Victor Thompson is a 54 y.o. male with history of CAD s/p coronary stent placement in 2005, cardiac arrest s/p ICD placement, HTN, and CHF who presents to the Emergency Department complaining of worsening SOB with gradual onset this evening. His breathing is worse with laying flat and this feels like fluid around his lungs. His home inhalers have provided no relief in symptoms.  Associated symptoms include sweating, heavy epigastric pain, and a dry cough that turned hemoptoic around 7:30 PM. Pt denies chest pain, weight gain, and fever. He is on aspirin but no other anticoagulant . No history of DVT/PE. No recent long travel by car or plane, surgery or hospitalization, or bone fracture. Pt denies tobacco or illicit drug use. He denies history of MI. His last cardiac testing was 1 year ago.    Past Medical History  Diagnosis Date  . Coronary artery disease   . Hypertension   . Cardiac arrest [facility])    Past Surgical History  Procedure Laterality Date  . Implantable cardioverter defibrillator implant     History reviewed. No pertinent family history. Social History  Substance Use Topics  . Smoking status: Former Research scientist (life sciences)  . Smokeless tobacco: None  . Alcohol Use: No    Review of Systems  Constitutional: Positive for diaphoresis. Negative for fever and unexpected weight change.  Respiratory: Positive for cough (productive of blood) and shortness of breath.   Cardiovascular: Negative for chest pain.  Gastrointestinal: Positive for abdominal pain.   10 Systems  reviewed and all are negative for acute change except as noted in the HPI.  Allergies  Lidocaine hcl; Lisinopril; and Penicillins  Home Medications   Prior to Admission medications   Medication Sig Start Date End Date Taking? Authorizing Provider  albuterol (PROVENTIL HFA;VENTOLIN HFA) 108 (90 BASE) MCG/ACT inhaler Inhale 2 puffs into the lungs every 4 (four) hours as needed for wheezing or shortness of breath. 01/14/15   Janne Napoleon, NP  aspirin EC 81 MG tablet Take 81 mg by mouth daily.    Historical Provider, MD  atorvastatin (LIPITOR) 40 MG tablet Take 40 mg by mouth daily.    Historical Provider, MD  carvedilol (COREG) 25 MG tablet Take 25 mg by mouth 2 (two) times daily with a meal.    Historical Provider, MD  EPINEPHrine 0.3 mg/0.3 mL IJ SOAJ injection Inject 0.3 mLs (0.3 mg total) into the muscle once. 09/03/15   Deneise Lever, MD  furosemide (LASIX) 40 MG tablet Take 1 tablet by mouth daily. 06/25/15   Historical Provider, MD  hydrALAZINE (APRESOLINE) 25 MG tablet Take 25 mg by mouth 3 (three) times daily.    Historical Provider, MD  HYDROcodone-acetaminophen (NORCO) 7.5-325 MG per tablet Take 1 tablet by mouth every 6 (six) hours as needed for moderate pain.    Historical Provider, MD  isosorbide dinitrate (ISORDIL) 20 MG tablet Take 20 mg by mouth 3 (three) times daily.    Historical Provider, MD  naproxen (NAPROSYN) 500 MG tablet Take 1 tablet by mouth 2 (two) times  daily. WITH FOOD AS DIRECTED 06/25/15   Historical Provider, MD   Pulse 109  Temp(Src) 97.9 F (36.6 C) (Oral)  Ht 5\' 5"  (1.651 m)  Wt 300 lb (136.079 kg)  BMI 49.92 kg/m2  SpO2 97% Physical Exam  Constitutional: He is oriented to person, place, and time. He appears well-developed and well-nourished.  HENT:  Head: Normocephalic and atraumatic.  Eyes: EOM are normal.  Neck: Normal range of motion.  Cardiovascular: Regular rhythm, normal heart sounds and intact distal pulses.  Tachycardia present.    Pulmonary/Chest: He is in respiratory distress. He has rales.   Mild subcostal retractions  Abdominal: Soft. He exhibits no distension. There is no tenderness.  Musculoskeletal: Normal range of motion.  No unilateral LE swelling or calf tenderness. No pitting edema.  Neurological: He is alert and oriented to person, place, and time.  Skin: Skin is warm and dry.  Psychiatric: He has a normal mood and affect. Judgment normal.  Nursing note and vitals reviewed.   ED Course  Procedures (including critical care time)  COORDINATION OF CARE: 11:57 PM Discussed treatment plan which includes CXR, EKG, and labs with pt at bedside and pt agreed to plan.  1:34 AM I re-evaluated the patient and provided an update on the results of his CXR and lab work.   1:53 AM-Consult complete with Dr. Doylene Canard (Internal Medicine). Patient case explained and discussed. Agrees to admit patient for further evaluation and treatment. Call ended at 1:53 AM  Labs Review Labs Reviewed  TROPONIN I - Abnormal; Notable for the following:    Troponin I 0.06 (*)    All other components within normal limits  BASIC METABOLIC PANEL - Abnormal; Notable for the following:    CO2 18 (*)    Glucose, Bld 117 (*)    BUN 24 (*)    Creatinine, Ser 2.09 (*)    Calcium 8.8 (*)    GFR calc non Af Amer 35 (*)    GFR calc Af Amer 40 (*)    All other components within normal limits  BRAIN NATRIURETIC PEPTIDE - Abnormal; Notable for the following:    B Natriuretic Peptide 686.3 (*)    All other components within normal limits  CBC WITH DIFFERENTIAL/PLATELET - Abnormal; Notable for the following:    RDW 15.7 (*)    Lymphs Abs 0.6 (*)    All other components within normal limits  APTT  PROTIME-INR  TROPONIN I  TROPONIN I  I-STAT TROPOININ, ED    Imaging Review Dg Chest Port 1 View  11/24/2015  CLINICAL DATA:  54 year old male with dyspnea EXAM: PORTABLE CHEST 1 VIEW COMPARISON:  Chest radiograph dated 07/03/2015  FINDINGS: Single portable view of the chest demonstrate increased vascular and interstitial prominence with interval progression compared to the prior study. There is cardiomegaly. No focal consolidation, pleural effusion, or pneumothorax. The osseous structures are grossly unremarkable. A defibrillator is again seen in stable positioning. IMPRESSION: Diffuse vascular prominence most compatible with congestive changes. Clinical correlation and follow-up recommended. Electronically Signed   By: Anner Crete M.D.   On: 11/24/2015 00:59   I have personally reviewed and evaluated these images and lab results as part of my medical decision-making.   EKG Interpretation   Date/Time:  Sunday November 23 2015 23:55:16 EST Ventricular Rate:  97 PR Interval:  179 QRS Duration: 134 QT Interval:  390 QTC Calculation: 495 R Axis:   -58 Text Interpretation:  Sinus tachycardia Multiform ventricular premature  complexes Probable left  atrial enlargement Left bundle branch block No  significant change since last tracing Confirmed by Jayma Volpi, MD, Venise Ellingwood  [phone]) on 11/24/2015 12:01:11 AM      MDM   Final diagnoses:  Acute pulmonary edema (HCC)  Acute on chronic systolic congestive heart failure ([place])  Hypertensive emergency  Acute respiratory failure with hypoxia ([place])    I personally performed the services described in this documentation, which was scribed in my presence. The recorded information has been reviewed and is accurate.   CRITICAL CARE Performed by: Varney Biles   Total critical care time: 48 minutes  Critical care time was exclusive of separately billable procedures and treating other patients.  Critical care was necessary to treat or prevent imminent or life-threatening deterioration.  Critical care was time spent personally by me on the following activities: development of treatment plan with patient and/or surrogate as well as nursing, discussions with consultants,  evaluation of patient's response to treatment, examination of patient, obtaining history from patient or surrogate, ordering and performing treatments and interventions, ordering and review of laboratory studies, ordering and review of radiographic studies, pulse oximetry and re-evaluation of patient's condition.   Pt comes in with cc of DIB. He is in acute hypoxic respiratory failure at arrival. Pt has hx of CHF and asthma, no wheezing. He wasp laced on CPAP due to the resp failure - and the O2 sats improved.  Clinical concerns for pulmonary edema. BP is elevated to MAP of 130 during my evaluation.  Pt reports cough with bloody phlegm. Pt has no hx of PE, DVT and denies any exogenous estrogen use, long distance travels or surgery in the past 6 weeks, active cancer, recent immobilization.  He denies any fevers, wheezing with the cough. ? Bronchitis. PE less deemed less likely based on the clinical presentation - but if hypoxia persists, or if the hemoptysis gets massive - pulmonary etiology including hemorrhage and PE will have to be considered. For now, there is certainly no massive hemoptysis and thus we will get CXR only.   2:53 AM Pt has pulm vascular congestion - will tx as HTN emergency. Nitro drip started. Goal MAP of 90-100 mmhg. Lasix 40 ordered iv. Will reassess.   Varney Biles, MD 11/24/15 [phone]

## 2015-11-23 NOTE — ED Notes (Signed)
Pt arrived complaining of Shortness of Breath.

## 2015-11-23 NOTE — ED Notes (Signed)
The the pt arrived at  Triage  Acute resp distress  He refused a w/c  Said he could not breath would not sit down in triagehe kept pacing unable to   Obtain a pulse ox initially.  Finally received a pulse ox of 78% sweating profusely  Speaking in one word replys.Jaynie Bream for 2 hours.  He uses an inhaler at home.   Unable to obtain any info.  Back to treatment room

## 2015-11-24 ENCOUNTER — Inpatient Hospital Stay (HOSPITAL_COMMUNITY): Payer: No Typology Code available for payment source

## 2015-11-24 ENCOUNTER — Encounter (HOSPITAL_COMMUNITY): Payer: Self-pay

## 2015-11-24 ENCOUNTER — Emergency Department (HOSPITAL_COMMUNITY): Payer: Self-pay

## 2015-11-24 DIAGNOSIS — I5021 Acute systolic (congestive) heart failure: Secondary | ICD-10-CM | POA: Diagnosis present

## 2015-11-24 LAB — BASIC METABOLIC PANEL
Anion gap: 10 (ref 5–15)
BUN: 24 mg/dL — ABNORMAL HIGH (ref 6–20)
CO2: 18 mmol/L — ABNORMAL LOW (ref 22–32)
Calcium: 8.8 mg/dL — ABNORMAL LOW (ref 8.9–10.3)
Chloride: 110 mmol/L (ref 101–111)
Creatinine, Ser: 2.09 mg/dL — ABNORMAL HIGH (ref 0.61–1.24)
GFR calc Af Amer: 40 mL/min — ABNORMAL LOW (ref >60)
GFR calc non Af Amer: 35 mL/min — ABNORMAL LOW (ref >60)
Glucose, Bld: 117 mg/dL — ABNORMAL HIGH (ref 65–99)
Potassium: 4.1 mmol/L (ref 3.5–5.1)
Sodium: 138 mmol/L (ref 135–145)

## 2015-11-24 LAB — CBC WITH DIFFERENTIAL/PLATELET
Basophils Absolute: 0 10*3/uL (ref 0.0–0.1)
Basophils Relative: 0 %
Eosinophils Absolute: 0.1 10*3/uL (ref 0.0–0.7)
Eosinophils Relative: 1 %
HCT: 46.3 % (ref 39.0–52.0)
Hemoglobin: 14.9 g/dL (ref 13.0–17.0)
Lymphocytes Relative: 10 %
Lymphs Abs: 0.6 10*3/uL — ABNORMAL LOW (ref 0.7–4.0)
MCH: 29.3 pg (ref 26.0–34.0)
MCHC: 32.2 g/dL (ref 30.0–36.0)
MCV: 91 fL (ref 78.0–100.0)
Monocytes Absolute: 0.2 10*3/uL (ref 0.1–1.0)
Monocytes Relative: 3 %
Neutro Abs: 5.6 10*3/uL (ref 1.7–7.7)
Neutrophils Relative %: 86 %
Platelets: 247 10*3/uL (ref 150–400)
RBC: 5.09 MIL/uL (ref 4.22–5.81)
RDW: 15.7 % — ABNORMAL HIGH (ref 11.5–15.5)
WBC: 6.5 10*3/uL (ref 4.0–10.5)

## 2015-11-24 LAB — I-STAT TROPONIN, ED: Troponin i, poc: 0 ng/mL (ref 0.00–0.08)

## 2015-11-24 LAB — PROTIME-INR
INR: 1.01 (ref 0.00–1.49)
Prothrombin Time: 13.5 seconds (ref 11.6–15.2)

## 2015-11-24 LAB — TROPONIN I
Troponin I: 0.04 ng/mL — ABNORMAL HIGH (ref <0.031)
Troponin I: 0.06 ng/mL — ABNORMAL HIGH (ref <0.031)

## 2015-11-24 LAB — BRAIN NATRIURETIC PEPTIDE: B Natriuretic Peptide: 686.3 pg/mL — ABNORMAL HIGH (ref 0.0–100.0)

## 2015-11-24 LAB — APTT: aPTT: 34 seconds (ref 24–37)

## 2015-11-24 MED ORDER — ALBUTEROL SULFATE HFA 108 (90 BASE) MCG/ACT IN AERS: 2.0000 | INHALATION_SPRAY | RESPIRATORY_TRACT | Status: DC | PRN

## 2015-11-24 MED ORDER — FUROSEMIDE 10 MG/ML IJ SOLN
40.0000 mg | Freq: Two times a day (BID) | INTRAMUSCULAR | Status: DC
  Administered 2015-11-24 (×2): 40 mg via INTRAVENOUS
  Filled 2015-11-24 (×2): qty 4

## 2015-11-24 MED ORDER — ASPIRIN EC 81 MG PO TBEC
81.0000 mg | DELAYED_RELEASE_TABLET | Freq: Every day | ORAL | Status: DC
  Administered 2015-11-24 – 2015-11-26 (×3): 81 mg via ORAL
  Filled 2015-11-24 (×3): qty 1

## 2015-11-24 MED ORDER — ONDANSETRON HCL 4 MG/2ML IJ SOLN
4.0000 mg | Freq: Four times a day (QID) | INTRAMUSCULAR | Status: DC | PRN
  Administered 2015-11-25: 4 mg via INTRAVENOUS
  Filled 2015-11-24: qty 2

## 2015-11-24 MED ORDER — CETYLPYRIDINIUM CHLORIDE 0.05 % MT LIQD
7.0000 mL | Freq: Two times a day (BID) | OROMUCOSAL | Status: DC
  Administered 2015-11-24 – 2015-11-26 (×4): 7 mL via OROMUCOSAL

## 2015-11-24 MED ORDER — HYDRALAZINE HCL 25 MG PO TABS
25.0000 mg | ORAL_TABLET | Freq: Three times a day (TID) | ORAL | Status: DC
  Administered 2015-11-24 – 2015-11-26 (×7): 25 mg via ORAL
  Filled 2015-11-24 (×10): qty 1

## 2015-11-24 MED ORDER — ATORVASTATIN CALCIUM 40 MG PO TABS
40.0000 mg | ORAL_TABLET | Freq: Every day | ORAL | Status: DC
  Administered 2015-11-24 – 2015-11-26 (×3): 40 mg via ORAL
  Filled 2015-11-24 (×4): qty 1

## 2015-11-24 MED ORDER — SODIUM CHLORIDE 0.9 % IJ SOLN: 3.0000 mL | INTRAMUSCULAR | Status: DC | PRN

## 2015-11-24 MED ORDER — ACETAMINOPHEN 325 MG PO TABS
650.0000 mg | ORAL_TABLET | ORAL | Status: DC | PRN
  Administered 2015-11-24 (×3): 650 mg via ORAL
  Filled 2015-11-24 (×3): qty 2

## 2015-11-24 MED ORDER — SODIUM CHLORIDE 0.9 % IV SOLN: 250.0000 mL | INTRAVENOUS | Status: DC | PRN

## 2015-11-24 MED ORDER — FUROSEMIDE 10 MG/ML IJ SOLN
40.0000 mg | Freq: Once | INTRAMUSCULAR | Status: AC
  Administered 2015-11-24: 40 mg via INTRAVENOUS
  Filled 2015-11-24: qty 4

## 2015-11-24 MED ORDER — ALBUTEROL SULFATE (2.5 MG/3ML) 0.083% IN NEBU: 2.5000 mg | INHALATION_SOLUTION | RESPIRATORY_TRACT | Status: DC | PRN

## 2015-11-24 MED ORDER — AMLODIPINE BESYLATE 5 MG PO TABS
5.0000 mg | ORAL_TABLET | Freq: Every day | ORAL | Status: DC
  Administered 2015-11-24 – 2015-11-26 (×3): 5 mg via ORAL
  Filled 2015-11-24 (×3): qty 1

## 2015-11-24 MED ORDER — NITROGLYCERIN IN D5W 200-5 MCG/ML-% IV SOLN
50.0000 ug/min | INTRAVENOUS | Status: DC
  Administered 2015-11-24: 50 ug/min via INTRAVENOUS
  Administered 2015-11-24: 60 ug/min via INTRAVENOUS
  Filled 2015-11-24 (×2): qty 250

## 2015-11-24 MED ORDER — HEPARIN SODIUM (PORCINE) 5000 UNIT/ML IJ SOLN
5000.0000 [IU] | Freq: Three times a day (TID) | INTRAMUSCULAR | Status: DC
Start: 2015-11-24 — End: 2015-11-26
  Administered 2015-11-24: 5000 [IU] via SUBCUTANEOUS
  Filled 2015-11-24 (×3): qty 1

## 2015-11-24 MED ORDER — HYDROCODONE-ACETAMINOPHEN 7.5-325 MG PO TABS
1.0000 | ORAL_TABLET | Freq: Four times a day (QID) | ORAL | Status: DC | PRN
  Administered 2015-11-24 – 2015-11-25 (×2): 1 via ORAL
  Filled 2015-11-24 (×2): qty 1

## 2015-11-24 MED ORDER — SODIUM CHLORIDE 0.9 % IJ SOLN
3.0000 mL | Freq: Two times a day (BID) | INTRAMUSCULAR | Status: DC
Start: 2015-11-24 — End: 2015-11-26
  Administered 2015-11-25 – 2015-11-26 (×2): 3 mL via INTRAVENOUS

## 2015-11-24 NOTE — Procedures (Signed)
Placed pt on bipap per MD.

## 2015-11-24 NOTE — Progress Notes (Signed)
Pt is on CPAP at this time tolerates it well. No complications noted

## 2015-11-24 NOTE — H&P (Signed)
Referring Physician:  Ebony Rickel is an 54 y.o. male.                       Chief Complaint: shortness of breath  HPI: 54 y.o. male with history of CAD, s/p coronary stent placement in 2005, cardiac arrest s/p ICD placement, HTN, and CHF who presents to the Emergency Department complaining of worsening SOB with gradual onset this evening. His breathing is worse with laying flat and this feels like fluid around his lungs. His home inhalers have provided no relief in symptoms. Associated symptoms include sweating, heavy epigastric pain, and a dry cough. BNP elevated at 686.3 and troponin-I minimally elevated at 0.06 ng/mL.  Past Medical History  Diagnosis Date  . Coronary artery disease   . Hypertension   . Cardiac arrest [facility])       Past Surgical History  Procedure Laterality Date  . Implantable cardioverter defibrillator implant      History reviewed. No pertinent family history. Social History:  reports that he has quit smoking. He does not have any smokeless tobacco history on file. He reports that he does not drink alcohol or use illicit drugs.  Allergies:  Allergies  Allergen Reactions  . Lidocaine Hcl     REACTION: tongue swelling  . Lisinopril     REACTION: tougne swelling.Pt reported problem with a BP med which sounded like  lisinopril  But as of 09/19/06,pt had tolerated altace without problem  . Penicillins     Cillin family per pt.  Mw,cma     (Not in a hospital admission)  Results for orders placed or performed during the hospital encounter of 11/23/15 (from the past 48 hour(s))  APTT     Status: None   Collection Time: 11/24/15 12:00 AM  Result Value Ref Range   aPTT 34 24 - 37 seconds  Protime-INR     Status: None   Collection Time: 11/24/15 12:00 AM  Result Value Ref Range   Prothrombin Time 13.5 11.6 - 15.2 seconds   INR 1.01 0.00 - 1.49  Troponin I  (0, 3, 6)     Status: Abnormal   Collection Time: 11/24/15 12:00 AM  Result Value Ref Range    Troponin I 0.06 (H) <0.031 ng/mL    Comment:        PERSISTENTLY INCREASED TROPONIN VALUES IN THE RANGE OF 0.04-0.49 ng/mL CAN BE SEEN IN:       -UNSTABLE ANGINA       -CONGESTIVE HEART FAILURE       -MYOCARDITIS       -CHEST TRAUMA       -ARRYHTHMIAS       -LATE PRESENTING MYOCARDIAL INFARCTION       -COPD   CLINICAL FOLLOW-UP RECOMMENDED.   Basic metabolic panel     Status: Abnormal   Collection Time: 11/24/15 12:00 AM  Result Value Ref Range   Sodium 138 135 - 145 mmol/L   Potassium 4.1 3.5 - 5.1 mmol/L   Chloride 110 101 - 111 mmol/L   CO2 18 (L) 22 - 32 mmol/L   Glucose, Bld 117 (H) 65 - 99 mg/dL   BUN 24 (H) 6 - 20 mg/dL   Creatinine, Ser 2.09 (H) 0.61 - 1.24 mg/dL   Calcium 8.8 (L) 8.9 - 10.3 mg/dL   GFR calc non Af Amer 35 (L) >60 mL/min   GFR calc Af Amer 40 (L) >60 mL/min    Comment: (NOTE) The eGFR has  been calculated using the CKD EPI equation. This calculation has not been validated in all clinical situations. eGFR's persistently <60 mL/min signify possible Chronic Kidney Disease.    Anion gap 10 5 - 15  Brain natriuretic peptide     Status: Abnormal   Collection Time: 11/24/15 12:00 AM  Result Value Ref Range   B Natriuretic Peptide 686.3 (H) 0.0 - 100.0 pg/mL  CBC with Differential/Platelet     Status: Abnormal   Collection Time: 11/24/15 12:00 AM  Result Value Ref Range   WBC 6.5 4.0 - 10.5 K/uL   RBC 5.09 4.22 - 5.81 MIL/uL   Hemoglobin 14.9 13.0 - 17.0 g/dL   HCT 46.3 39.0 - 52.0 %   MCV 91.0 78.0 - 100.0 fL   MCH 29.3 26.0 - 34.0 pg   MCHC 32.2 30.0 - 36.0 g/dL   RDW 15.7 (H) 11.5 - 15.5 %   Platelets 247 150 - 400 K/uL   Neutrophils Relative % 86 %   Neutro Abs 5.6 1.7 - 7.7 K/uL   Lymphocytes Relative 10 %   Lymphs Abs 0.6 (L) 0.7 - 4.0 K/uL   Monocytes Relative 3 %   Monocytes Absolute 0.2 0.1 - 1.0 K/uL   Eosinophils Relative 1 %   Eosinophils Absolute 0.1 0.0 - 0.7 K/uL   Basophils Relative 0 %   Basophils Absolute 0.0 0.0 - 0.1 K/uL   I-Stat Troponin, ED (not at [facility], [facility])     Status: None   Collection Time: 11/24/15 12:04 AM  Result Value Ref Range   Troponin i, poc 0.00 0.00 - 0.08 ng/mL   Comment 3            Comment: Due to the release kinetics of cTnI, a negative result within the first hours of the onset of symptoms does not rule out myocardial infarction with certainty. If myocardial infarction is still suspected, repeat the test at appropriate intervals.    Dg Chest Port 1 View  11/24/2015  CLINICAL DATA:  52-year-old male with dyspnea EXAM: PORTABLE CHEST 1 VIEW COMPARISON:  Chest radiograph dated 07/03/2015 FINDINGS: Single portable view of the chest demonstrate increased vascular and interstitial prominence with interval progression compared to the prior study. There is cardiomegaly. No focal consolidation, pleural effusion, or pneumothorax. The osseous structures are grossly unremarkable. A defibrillator is again seen in stable positioning. IMPRESSION: Diffuse vascular prominence most compatible with congestive changes. Clinical correlation and follow-up recommended. Electronically Signed   By: Arash  Radparvar M.D.   On: 11/24/2015 00:59    Review Of Systems Constitutional: Positive for diaphoresis. Negative for fever and unexpected weight change.  Respiratory: Positive for cough (productive of blood) and shortness of breath.  Cardiovascular: Negative for chest pain.  Gastrointestinal: Positive for abdominal pain.  10 Systems reviewed and all are negative for acute change except as noted in the HPI.  Blood pressure 156/107, pulse 76, temperature 97.9 F (36.6 C), temperature source Oral, resp. rate 25, height 5' 7" (1.702 m), weight 128.368 kg (283 lb), SpO2 98 %.  Physical Exam  Constitutional: He is oriented to person, place, and time. He appears well-developed and well-nourished.  HENT: Head: Normocephalic and atraumatic. Switzer eyes, conjunctivae-pink, Sclerae-non-icteric Neck: + JVD, Normal range  of motion.  Cardiovascular: Regular rhythm, normal heart sounds and intact distal pulses.   Pulmonary/Chest: Breath sounds normal. He is in mild respiratory distress.  Abdominal: Soft. He exhibits no distension. There is no tenderness.  Musculoskeletal: Normal range of motion. 1 +   bilateral lower leg edema. Neurological: He is alert and oriented to person, place, and time. Cranial nerves grossly intact. Moves all four extremities. Skin: Skin is warm and dry.  Psychiatric: He has a normal mood and affect.   Nursing note and vitals reviewed.  Assessment/Plan Acute congestive heart failure R/O MI CAD S/P Coronary Stent Hypertension Morbid obesity Survival of cardiac arrest  Admit/IV NTG/Oxygen/Lasix/Home medications.  Birdie Riddle, MD  11/24/2015, 2:56 AM

## 2015-11-24 NOTE — ED Notes (Signed)
Pt. Voided without any difficulty. PT. Was not  In and Out CAthed

## 2015-11-24 NOTE — ED Notes (Signed)
IM MD at bedside. 

## 2015-11-24 NOTE — Progress Notes (Signed)
  Echocardiogram 2D Echocardiogram has been performed.  Darlina Sicilian M 11/24/2015, 3:28 PM

## 2015-11-24 NOTE — ED Notes (Signed)
Pt. Given orange juice and crackers

## 2015-11-24 NOTE — Discharge Planning (Signed)
Patient is a current orange card holder and established with care at [facility] [address]. Follow up appointment made for Jan 12,2017 at 10:45am with patients pcp, pt verbalized understanding of the upcoming appointment. Patient will also be linked with a [facility] case manager upon discharge. My contact information provided for any future questions or concerns. No other [place] Specialist needs identified at this time.   [place] Specialist  Partnership for [facility] [phone]

## 2015-11-25 LAB — BASIC METABOLIC PANEL
Anion gap: 8 (ref 5–15)
BUN: 22 mg/dL — ABNORMAL HIGH (ref 6–20)
CO2: 27 mmol/L (ref 22–32)
Calcium: 8.2 mg/dL — ABNORMAL LOW (ref 8.9–10.3)
Chloride: 107 mmol/L (ref 101–111)
Creatinine, Ser: 2.15 mg/dL — ABNORMAL HIGH (ref 0.61–1.24)
GFR calc Af Amer: 39 mL/min — ABNORMAL LOW (ref >60)
GFR calc non Af Amer: 34 mL/min — ABNORMAL LOW (ref >60)
Glucose, Bld: 108 mg/dL — ABNORMAL HIGH (ref 65–99)
Potassium: 3.7 mmol/L (ref 3.5–5.1)
Sodium: 142 mmol/L (ref 135–145)

## 2015-11-25 MED ORDER — ISOSORBIDE MONONITRATE ER 30 MG PO TB24
30.0000 mg | ORAL_TABLET | Freq: Every day | ORAL | Status: DC
  Administered 2015-11-25 – 2015-11-26 (×2): 30 mg via ORAL
  Filled 2015-11-25 (×2): qty 1

## 2015-11-25 MED ORDER — FUROSEMIDE 80 MG PO TABS
80.0000 mg | ORAL_TABLET | Freq: Every day | ORAL | Status: DC
  Administered 2015-11-25 – 2015-11-26 (×2): 80 mg via ORAL
  Filled 2015-11-25 (×2): qty 1

## 2015-11-25 NOTE — Hospital Discharge Follow-Up (Signed)
Transitional Care Clinic Care Coordination Note:  Admit date: 11/23/2015  Discharge date: TBD Discharge Disposition: home Patient contact: Cell # [phone] Emergency contact(s): Adolphus Birchwood ( cousin)  - he could not remember her #  This Case Manager reviewed patient's EMR and determined patient would benefit from post-discharge medical management and chronic care management services through the [facility]. Patient has a history of CAD - s/p stent, cardiac arrest, s/p ICD, HTN, CHF.  He was admitted with shortness of breath and is being treated for acute pulmonary edema.  He has been admitted to the hospital 2 times in the past year and has been seen in the ED 4 times in the past year. This Case Manager met with patient to discuss the services and medical management that can be provided at the [facility]. Patient verbalized understanding and agreed to receive post-discharge care at the [facility].   Patient scheduled for Transitional Care appointment on 12/04/15 @ 1115 with Dr Jarold Song.  Clinic information and appointment time provided to patient. Appointment information also placed on AVS.  Assessment:       Home Environment: lives in a private home with his cousin.  He said that he has stairs to negotiate in the home.        Support System: his cousin.  He denied having anyone else in the area that can provide assistance to him.  He said that his cousin works 2 jobs.        Level of functioning: independent       Home DME: He said that he has a CPAP machine but does not know what company it is from. No assistive devices for ambulation.        Home care services: (services arranged prior to discharge or new services after discharge)       Transportation: He said that his cousin drives and can brig him to his medical appointments        Food/Nutrition: (ability to afford, access, use of any community resources)  He denied any problems obtaining/afffording  food.  He said that he had food stamps ran out.  His cousin does the food shopping and meal preparation.         Medications: (ability to afford, access, compliance, Pharmacy used) He said that he has been able to afford medications that are less than $4. He noted that the pharmacy that he had been using is closed and he is interested in having his medications filled at [facility].        Identified Barriers: no insurance, no support other than his cousin, possible difficulty affording medications.         PCP (Name, office location, phone number): He said that he [facility] but would like to establish care at [facility].   Patient Education: Instructed him about the services provided at [facility] including social work, pharmacy assistance and financial counseling.  He stated that he is interested in meeting with the financial counselor.  Explained the possible options of the [employer] and [employer].  He said that he had medicaid in the past but was denied when he applied this year.  He was very appreciative of the information provided.

## 2015-11-25 NOTE — Progress Notes (Signed)
Pt states feels horrible this morning, BP 119/68. Pt wants nitroglycerin off, stopped per request. Also nauseated, zofran given. Will continue to monitor. Carroll Kinds RN

## 2015-11-25 NOTE — Plan of Care (Signed)
Problem: Fluid Volume: Goal: Ability to maintain a balanced intake and output will improve Outcome: Adequate for Discharge Pt given information on heart failure maintenance and need to maintain less than 1200cc of fluid in a day

## 2015-11-25 NOTE — Progress Notes (Signed)
Ref: Birdie Riddle, MD   Subjective:  Good diuresis. No chest pain. Severe headache from NTG drip. Improved blood pressure control.  Objective:  Vital Signs in the last 24 hours: Temp:  [97.6 F (36.4 C)-98.6 F (37 C)] 98.6 F (37 C) (12/20 1603) Pulse Rate:  [75-85] 85 (12/20 1603) Cardiac Rhythm:  [-] Normal sinus rhythm (12/20 1605) Resp:  [12-25] 16 (12/20 1603) BP: (102-159)/(61-94) 138/81 mmHg (12/20 1603) SpO2:  [97 %-100 %] 98 % (12/20 1603) Weight:  [130.001 kg (286 lb 9.6 oz)] 130.001 kg (286 lb 9.6 oz) (12/20 0400)  Physical Exam: BP Readings from Last 1 Encounters:  11/25/15 138/81    Wt Readings from Last 1 Encounters:  11/25/15 130.001 kg (286 lb 9.6 oz)    Weight change: 1.984 kg (4 lb 6 oz)  HEENT: [place]/AT, Eyes-[place], PERL, EOMI, Conjunctiva-Pink, Sclera-Non-icteric Neck: No JVD, No bruit, Trachea midline. Lungs:  Clearing, Bilateral. Cardiac:  Regular rhythm, normal S1 and S2, no S3.  Abdomen:  Soft, non-tender. Extremities:  Trace edema present. No cyanosis. No clubbing. CNS: AxOx3, Cranial nerves grossly intact, moves all 4 extremities. Right handed. Skin: Warm and dry.   Intake/Output from previous day: 12/19 0701 - 12/20 0700 In: 1556.1 [P.O.:1040; I.V.:516.1] Out: [id number] [Urine:3525]    Lab Results: BMET    Component Value Date/Time   NA 142 11/25/2015 0355   NA 138 11/24/2015 0000   NA 141 08/03/2015 0749   K 3.7 11/25/2015 0355   K 4.1 11/24/2015 0000   K 3.8 08/03/2015 0749   CL 107 11/25/2015 0355   CL 110 11/24/2015 0000   CL 108 08/03/2015 0749   CO2 27 11/25/2015 0355   CO2 18* 11/24/2015 0000   CO2 23 08/03/2015 0749   GLUCOSE 108* 11/25/2015 0355   GLUCOSE 117* 11/24/2015 0000   GLUCOSE 145* 08/03/2015 0749   BUN 22* 11/25/2015 0355   BUN 24* 11/24/2015 0000   BUN 24* 08/03/2015 0749   CREATININE 2.15* 11/25/2015 0355   CREATININE 2.09* 11/24/2015 0000   CREATININE 2.16* 08/03/2015 0749   CALCIUM 8.2* 11/25/2015 0355    CALCIUM 8.8* 11/24/2015 0000   CALCIUM 8.5* 08/03/2015 0749   GFRNONAA 34* 11/25/2015 0355   GFRNONAA 35* 11/24/2015 0000   GFRNONAA 34* 08/03/2015 0749   GFRAA 39* 11/25/2015 0355   GFRAA 40* 11/24/2015 0000   GFRAA 39* 08/03/2015 0749   CBC    Component Value Date/Time   WBC 6.5 11/24/2015 0000   RBC 5.09 11/24/2015 0000   HGB 14.9 11/24/2015 0000   HCT 46.3 11/24/2015 0000   PLT 247 11/24/2015 0000   MCV 91.0 11/24/2015 0000   MCH 29.3 11/24/2015 0000   MCHC 32.2 11/24/2015 0000   RDW 15.7* 11/24/2015 0000   LYMPHSABS 0.6* 11/24/2015 0000   MONOABS 0.2 11/24/2015 0000   EOSABS 0.1 11/24/2015 0000   BASOSABS 0.0 11/24/2015 0000   HEPATIC Function Panel  Recent Labs  08/02/15 2205  PROT 5.8*   HEMOGLOBIN A1C No components found for: HGA1C,  MPG CARDIAC ENZYMES Lab Results  Component Value Date   TROPONINI 0.04* 11/24/2015   TROPONINI 0.06* 11/24/2015   BNP No results for input(s): PROBNP in the last 8760 hours. TSH No results for input(s): TSH in the last 8760 hours. CHOLESTEROL No results for input(s): CHOL in the last 8760 hours.  Scheduled Meds: . amLODipine  5 mg Oral Daily  . antiseptic oral rinse  7 mL Mouth Rinse BID  . aspirin EC  81 mg Oral Daily  . atorvastatin  40 mg Oral Daily  . furosemide  80 mg Oral Daily  . heparin  5,000 Units Subcutaneous 3 times per day  . hydrALAZINE  25 mg Oral TID  . isosorbide mononitrate  30 mg Oral Daily  . sodium chloride  3 mL Intravenous Q12H   Continuous Infusions:  PRN Meds:.sodium chloride, acetaminophen, albuterol, HYDROcodone-acetaminophen, ondansetron (ZOFRAN) IV, sodium chloride  Assessment/Plan: Acute congestive heart failure R/O MI CAD S/P Coronary Stent Hypertension Morbid obesity Survival of cardiac arrest   Offered cardiac catheterization. Patient wants to try medical therapy for now. Increase activity. DC NTG drip. Start low dose Imdur.   LOS: 1 day    Dixie Dials  MD   11/25/2015, 7:51 PM

## 2015-11-25 NOTE — Plan of Care (Signed)
Problem: Pain Managment: Goal: General experience of comfort will improve Outcome: Adequate for Discharge Pt tolerating po imdur without complaint of headache. No other c/o pain noted

## 2015-11-25 NOTE — Plan of Care (Signed)
Problem: Pain Managment: Goal: General experience of comfort will improve Outcome: Progressing Patient has been complaining of a headache related to nitroglycerin drip. Patient has been receiving pain medication when requested.   Problem: Physical Regulation: Goal: Ability to maintain clinical measurements within normal limits will improve Outcome: Progressing Patient's blood pressure has remained within normal limits during this shift. All other vital signs within normal limits.  Problem: Fluid Volume: Goal: Ability to maintain a balanced intake and output will improve Outcome: Progressing Patient is currently receiving IV lasix bid to treat fluid overload. Patient's intake and output is being strictly measured; patient has been educated about the purpose of his 1200 fluid restriction and has verbalized understanding.

## 2015-11-25 NOTE — Progress Notes (Signed)
UR Completed Arlett Goold Graves-Bigelow, RN,BSN [phone]  

## 2015-11-25 NOTE — Care Management Note (Addendum)
Case Management Note  Patient Details  Name: Victor Thompson MRN: [id number] Date of Birth: 1961-09-24  Subjective/Objective: Pt admitted for hypertension urgency and respiratory distress. Plan for d/c home once stable.                    Action/Plan: CM did speak with pt in regards to PCP/ Medication needs. Pt currently using the [facility] for PCP needs. Pt states pharmacy has closed and is having issues with purchasing medications. Pt is currently using [facility] $4.00 medication list. Please make sure all medications are generic upon d/c. CM did reach out to Liaison for [facility] to see if pt could get an appointment at the [facility]. CM will await call back. No further needs at this time.     Expected Discharge Date:                  Expected Discharge Plan:  Home/Self Care  In-House Referral:  NA  Discharge planning Services  CM Consult, [facility]  Post Acute Care Choice:  NA Choice offered to:  NA  DME Arranged:  N/A DME Agency:  NA  HH Arranged:  NA HH Agency:  NA  Status of Service:  Completed, signed off  Medicare Important Message Given:    Date Medicare IM Given:    Medicare IM give by:    Date Additional Medicare IM Given:    Additional Medicare Important Message give by:     If discussed at [place] of Stay Meetings, dates discussed:    Additional Comments:  Bethena Roys, RN 11/25/2015, 10:35 AM

## 2015-11-26 LAB — BASIC METABOLIC PANEL
Anion gap: 11 (ref 5–15)
BUN: 19 mg/dL (ref 6–20)
CO2: 22 mmol/L (ref 22–32)
Calcium: 8.5 mg/dL — ABNORMAL LOW (ref 8.9–10.3)
Chloride: 106 mmol/L (ref 101–111)
Creatinine, Ser: 1.99 mg/dL — ABNORMAL HIGH (ref 0.61–1.24)
GFR calc Af Amer: 43 mL/min — ABNORMAL LOW (ref >60)
GFR calc non Af Amer: 37 mL/min — ABNORMAL LOW (ref >60)
Glucose, Bld: 84 mg/dL (ref 65–99)
Potassium: 4.3 mmol/L (ref 3.5–5.1)
Sodium: 139 mmol/L (ref 135–145)

## 2015-11-26 MED ORDER — POTASSIUM CHLORIDE CRYS ER 10 MEQ PO TBCR: 10.0000 meq | EXTENDED_RELEASE_TABLET | Freq: Every day | ORAL | Status: DC

## 2015-11-26 MED ORDER — AMLODIPINE BESYLATE 5 MG PO TABS: 5.0000 mg | ORAL_TABLET | Freq: Every day | ORAL | Status: DC

## 2015-11-26 MED ORDER — ISOSORBIDE MONONITRATE ER 30 MG PO TB24: 30.0000 mg | ORAL_TABLET | Freq: Every day | ORAL | Status: DC

## 2015-11-26 MED ORDER — CARVEDILOL 12.5 MG PO TABS: 12.5000 mg | ORAL_TABLET | Freq: Two times a day (BID) | ORAL | Status: DC

## 2015-11-26 NOTE — Discharge Summary (Signed)
Physician Discharge Summary  Patient ID: Victor Thompson MRN: [id number] DOB/AGE: [birth year] 54 y.o.  Admit date: 11/23/2015 Discharge date: 11/26/2015  Admission Diagnoses: Acute congestive heart failure R/O MI CAD S/P Coronary Stent Hypertension Morbid obesity Survival of cardiac arrest Discharge Diagnoses:  Principle Problems: *  Acute left systolic and diastolic heart failure (HCC) MI ruled out CAD S/P Coronary Stent Abnormal troponin I from demand ischemia Hypertension Morbid obesity Survival of cardiac arrest CKD, II  Discharged Condition: fair  Hospital Course: 54 y.o. male with history of CAD, s/p coronary stent placement in 2005, cardiac arrest s/p ICD placement, HTN, and CHF who presents to the Emergency Department complaining of worsening SOB with gradual onset this evening. His breathing is worse with laying flat and this feels like fluid around his lungs. His home inhalers have provided no relief in symptoms. Associated symptoms include sweating, heavy epigastric pain, and a dry cough. BNP elevated at 686.3 and troponin-I minimally elevated at 0.06 ng/mL. He had good diuresis with IV lasix. He chose medical treatment for now and understood to decrease salt and fluid intake by 50 %. He was discharged home in stable condition with follow up in 1 week.  Consults: cardiology  Significant Diagnostic Studies: labs: Normal CBC and electrolytes. Minimally elevated Troponin I and blood glucose level. BUN/Cr were 24/2.09 and BNP was 686.3  EKG-SR, LBBB.  Echocardiogram: - Left ventricle: The cavity size was mildly dilated. Systolic function was mildly reduced. The estimated ejection fraction wasin the range of 45% to 50%. There is moderate hypokinesis of theanterior myocardium. There is mild hypokinesis of the inferiormyocardium. Doppler parameters are consistent with abnormal leftventricular relaxation (grade 1 diastolic dysfunction). - Mitral valve: Calcified annulus.  There was mild regurgitation. - Left atrium: The atrium was moderately to severely dilated.  Chest x-ray: Diffuse vascular prominence most compatible with congestive changes.  Treatments: cardiac meds: aspirin, carvedilol, amlodipine, furosemide, hydralazine, Imdur and potassium  Discharge Exam: Blood pressure 141/80, pulse 79, temperature 98.3 F (36.8 C), temperature source Oral, resp. rate 17, height 5\' 7"  (1.702 m), weight 128.232 kg (282 lb 11.2 oz), SpO2 98 %. HEENT: [place]/AT, Eyes-[place], PERL, EOMI, Conjunctiva-Pink, Sclera-Non-icteric Neck: No JVD, No bruit, Trachea midline. Lungs: Clearing, Bilateral. Cardiac: Regular rhythm, normal S1 and S2, no S3. II/VI systolic murmur. Abdomen: Soft, non-tender. Extremities: No edema present. No cyanosis. No clubbing. CNS: AxOx3, Cranial nerves grossly intact, moves all 4 extremities. Right handed. Skin: Warm and dry.  Disposition: 01-Home or Self Care     Medication List    STOP taking these medications        isosorbide dinitrate 20 MG tablet  Commonly known as:  ISORDIL     naproxen 500 MG tablet  Commonly known as:  NAPROSYN      TAKE these medications        albuterol 108 (90 BASE) MCG/ACT inhaler  Commonly known as:  PROVENTIL HFA;VENTOLIN HFA  Inhale 2 puffs into the lungs every 4 (four) hours as needed for wheezing or shortness of breath.     amLODipine 5 MG tablet  Commonly known as:  NORVASC  Take 1 tablet (5 mg total) by mouth daily.     aspirin EC 81 MG tablet  Take 81 mg by mouth daily.     atorvastatin 40 MG tablet  Commonly known as:  LIPITOR  Take 40 mg by mouth daily.     carvedilol 12.5 MG tablet  Commonly known as:  COREG  Take 1 tablet (12.5 mg  total) by mouth 2 (two) times daily with a meal.     EPINEPHrine 0.3 mg/0.3 mL Soaj injection  Commonly known as:  EPI-PEN  Inject 0.3 mLs (0.3 mg total) into the muscle once.     furosemide 40 MG tablet  Commonly known as:  LASIX  Take 1 tablet by  mouth daily.     hydrALAZINE 25 MG tablet  Commonly known as:  APRESOLINE  Take 25 mg by mouth 3 (three) times daily.     HYDROcodone-acetaminophen 7.5-325 MG tablet  Commonly known as:  NORCO  Take 1 tablet by mouth every 6 (six) hours as needed for moderate pain.     isosorbide mononitrate 30 MG 24 hr tablet  Commonly known as:  IMDUR  Take 1 tablet (30 mg total) by mouth daily.     potassium chloride 10 MEQ tablet  Commonly known as:  K-DUR,KLOR-CON  Take 1 tablet (10 mEq total) by mouth daily.           Follow-up Information    Follow up with [facility] on 12/04/2015.   Specialty:  Internal Medicine   Why:  at 11:15am for a [facility] appointment with Dr Jarold Song.   Contact information:   [id number] [place] [phone]      Follow up with Birdie Riddle, MD In 1 week.   Specialty:  Cardiology   Contact information:   [place] [address] [phone]       Signed: Birdie Riddle 11/26/2015, 9:42 AM

## 2015-11-26 NOTE — Hospital Discharge Follow-Up (Signed)
Transitional Care Clinic:  This Case Manager met with patient at bedside. Reminded patient of the follow-up and medical management provided at the [facility], and patient agreeable to [facility] follow-up after discharge. Patient is aware he will receive a post-discharge follow-up phone call after discharge, and he indicated [phone] is the best number to reach him. Reminded patient of [facility] and [employer] onsite resources (pharmacy, Solicitor, Social Work), and patient appreciative of information.  Jacqlyn Krauss, RN CM updated.

## 2015-11-26 NOTE — Plan of Care (Signed)
Problem: Fluid Volume: Goal: Ability to maintain a balanced intake and output will improve Outcome: Completed/Met Date Met:  11/26/15 Pt educated on fluid restriction

## 2015-11-26 NOTE — Plan of Care (Signed)
Problem: Education: Goal: Ability to demonstrate managment of disease process will improve Outcome: Completed/Met Date Met:  11/26/15 Heart Failure book at bedside, will continue to educate

## 2015-11-26 NOTE — Discharge Instructions (Signed)
Shortness of Breath Shortness of breath means you have trouble breathing. It could also mean that you have a medical problem. You should get immediate medical care for shortness of breath. CAUSES   Not enough oxygen in the air such as with high altitudes or a smoke-filled room.  Certain lung diseases, infections, or problems.  Heart disease or conditions, such as angina or heart failure.  Low red blood cells (anemia). Poor physical fitness, which can cause shortness of breath when you exercise.Isosorbide Mononitrate extended-release tablets What is this medicine? ISOSORBIDE MONONITRATE (eye soe SOR bide mon oh NYE trate) is a vasodilator. It relaxes blood vessels, increasing the blood and oxygen supply to your heart. This medicine is used to prevent chest pain caused by angina. It will not help to stop an episode of chest pain. This medicine may be used for other purposes; ask your health care provider or pharmacist if you have questions. What should I tell my health care provider before I take this medicine? They need to know if you have any of these conditions: -previous heart attack or heart failure -an unusual or allergic reaction to isosorbide mononitrate, nitrates, other medicines, foods, dyes, or preservatives -pregnant or trying to get pregnant -breast-feeding How should I use this medicine? Take this medicine by mouth with a glass of water. Follow the directions on the prescription label. Do not crush or chew. Take your medicine at regular intervals. Do not take your medicine more often than directed. Do not stop taking this medicine except on the advice of your doctor or health care professional. Talk to your pediatrician regarding the use of this medicine in children. Special care may be needed. Overdosage: If you think you have taken too much of this medicine contact a poison control center or emergency room at once. NOTE: This medicine is only for you. Do not share this medicine  with others. What if I miss a dose? If you miss a dose, take it as soon as you can. If it is almost time for your next dose, take only that dose. Do not take double or extra doses. What may interact with this medicine? Do not take this medicine with any of the following medications: -medicines used to treat erectile dysfunction (ED) like avanafil, sildenafil, tadalafil, and vardenafil -riociguat This medicine may also interact with the following medications: -medicines for high blood pressure -other medicines for angina or heart failure This list may not describe all possible interactions. Give your health care provider a list of all the medicines, herbs, non-prescription drugs, or dietary supplements you use. Also tell them if you smoke, drink alcohol, or use illegal drugs. Some items may interact with your medicine. What should I watch for while using this medicine? Check your heart rate and blood pressure regularly while you are taking this medicine. Ask your doctor or health care professional what your heart rate and blood pressure should be and when you should contact him or her. Tell your doctor or health care professional if you feel your medicine is no longer working. You may get dizzy. Do not drive, use machinery, or do anything that needs mental alertness until you know how this medicine affects you. To reduce the risk of dizzy or fainting spells, do not sit or stand up quickly, especially if you are an older patient. Alcohol can make you more dizzy, and increase flushing and rapid heartbeats. Avoid alcoholic drinks. Do not treat yourself for coughs, colds, or pain while you are taking this  medicine without asking your doctor or health care professional for advice. Some ingredients may increase your blood pressure. What side effects may I notice from receiving this medicine? Side effects that you should report to your doctor or health care professional as soon as possible: -bluish  discoloration of lips, fingernails, or palms of hands -irregular heartbeat, palpitations -low blood pressure -nausea, vomiting -persistent headache -unusually weak or tired Side effects that usually do not require medical attention (report to your doctor or health care professional if they continue or are bothersome): -flushing of the face or neck -rash This list may not describe all possible side effects. Call your doctor for medical advice about side effects. You may report side effects to FDA at 1-800-FDA-1088. Where should I keep my medicine? Keep out of the reach of children. Store between 15 and 30 degrees C (59 and 86 degrees F). Keep container tightly closed. Throw away any unused medicine after the expiration date. NOTE: This sheet is a summary. It may not cover all possible information. If you have questions about this medicine, talk to your doctor, pharmacist, or health care provider.    2016, Elsevier/Gold Standard. (2013-09-21 14:48:19)    Chest or back injuries or stiffness.  Being overweight.  Smoking.  Anxiety, which can make you feel like you are not getting enough air. DIAGNOSIS  Serious medical problems can often be found during your physical exam. Tests may also be done to determine why you are having shortness of breath. Tests may include:  Chest X-rays.  Lung function tests.  Blood tests.  An electrocardiogram (ECG).  An ambulatory electrocardiogram. An ambulatory ECG records your heartbeat patterns over a 24-hour period.  Exercise testing.  A transthoracic echocardiogram (TTE). During echocardiography, sound waves are used to evaluate how blood flows through your heart.  A transesophageal echocardiogram (TEE).  Imaging scans. Your health care provider may not be able to find a cause for your shortness of breath after your exam. In this case, it is important to have a follow-up exam with your health care provider as directed.  TREATMENT    Treatment for shortness of breath depends on the cause of your symptoms and can vary greatly. HOME CARE INSTRUCTIONS   Do not smoke. Smoking is a common cause of shortness of breath. If you smoke, ask for help to quit.  Avoid being around chemicals or things that may bother your breathing, such as paint fumes and dust.  Rest as needed. Slowly resume your usual activities.  If medicines were prescribed, take them as directed for the full length of time directed. This includes oxygen and any inhaled medicines.  Keep all follow-up appointments as directed by your health care provider. SEEK MEDICAL CARE IF:   Your condition does not improve in the time expected.  You have a hard time doing your normal activities even with rest.  You have any new symptoms. SEEK IMMEDIATE MEDICAL CARE IF:   Your shortness of breath gets worse.  You feel light-headed, faint, or develop a cough not controlled with medicines.  You start coughing up blood.  You have pain with breathing.  You have chest pain or pain in your arms, shoulders, or abdomen.  You have a fever.  You are unable to walk up stairs or exercise the way you normally do. MAKE SURE YOU:  Understand these instructions.  Will watch your condition.  Will get help right away if you are not doing well or get worse.   This information  is not intended to replace advice given to you by your health care provider. Make sure you discuss any questions you have with your health care provider.   Document Released: 08/17/2001 Document Revised: 11/27/2013 Document Reviewed: 02/07/2012 Elsevier Interactive Patient Education [employer].

## 2015-11-26 NOTE — Plan of Care (Signed)
Problem: Activity: Goal: Capacity to carry out activities will improve Outcome: Progressing Patient is now able to move around in his room with minimal shortness of breath. Patient is no longer requiring oxygen.   Problem: Education: Goal: Ability to demonstrate managment of disease process will improve Outcome: Progressing Patient has received education about the heart failure booklet including the importance of restricting fluid intake and salt intake and the need to weigh daily and record weights. Patient received education about the zone tool. Patient verbalized understanding and denies questions.  Problem: Safety: Goal: Ability to remain free from injury will improve Outcome: Completed/Met Date Met:  11/26/15 Patient has been education about safety and fall prevention. Patient verbalizes understanding. Patient is currently at a low risk for falls. Call bell is within reach.  Problem: Pain Managment: Goal: General experience of comfort will improve Outcome: Completed/Met Date Met:  11/26/15 Patient has had no complaints of pain since the discontinuation of his nitro drip.   Problem: Physical Regulation: Goal: Ability to maintain clinical measurements within normal limits will improve Outcome: Progressing Patients blood pressure has remained within normal limits; patient is no longer requiring a nitro drip for blood pressure control.  Problem: Skin Integrity: Goal: Risk for impaired skin integrity will decrease Outcome: Completed/Met Date Met:  11/26/15 Patient skin is intact. Patient is at a low risk for skin breakdown per the Braden scale. Patient has adequate nutrition and good mobility and therefore is at a low risk for pressure ulcers.   Problem: Fluid Volume: Goal: Ability to maintain a balanced intake and output will improve Outcome: Progressing Patient's IV lasix has been switched to PO. Patient's intake and output are being strictly monitored.

## 2015-11-26 NOTE — Plan of Care (Signed)
Problem: Activity: Goal: Capacity to carry out activities will improve Outcome: Completed/Met Date Met:  11/26/15 Pt up ad lib

## 2015-11-26 NOTE — Progress Notes (Signed)
UR Completed Cassie Henkels Graves-Bigelow, RN,BSN [phone]  

## 2015-11-26 NOTE — Progress Notes (Signed)
Pt refuse CPAP for tonight. Pt is stable at this time.

## 2015-11-26 NOTE — Plan of Care (Signed)
Problem: Education: Goal: Ability to verbalize understanding of medication therapies will improve Outcome: Completed/Met Date Met:  11/26/15 Education given to patient at discharge

## 2015-11-27 ENCOUNTER — Telehealth: Payer: Self-pay

## 2015-11-27 NOTE — Telephone Encounter (Signed)
[facility] Post-discharge Follow-Up Phone Call:  Date of Discharge: 11/26/15 Principal Discharge Diagnosis(es): Acute left systolic and diastolic heart failure Call Completed: Yes                    With Whom: Patient    Please check all that apply:  X  Patient is knowledgeable of his/her condition(s) and/or treatment. X  Patient is caring for self at home.  ? Patient is receiving assist at home from family and/or caregiver. Family and/or caregiver is knowledgeable of patient's condition(s) and/or treatment. ? Patient is receiving home health services. If so, name of agency.     Medication Reconciliation:  X  Medication list reviewed with patient. X  Patient obtained all discharge medications-NO.  Medication list thoroughly reviewed with patient. After reviewing all medications patient was supposed to be on, it was determined patient had not yet picked up amlodipine 5 mg daily and isosorbide mononitrate 30 mg daily.  Informed patient to pick up medications as soon as possible.  Patient verbalized understanding. Patient also informed that per discharge summary he is to STOP taking isosorbide dinitrate 20 mg tablet and naproxen 500 mg per discharge summary. Patient indicated he was unaware of that and was appreciative of information.    Activities of Daily Living:  X  Independent ? Needs assist  ? Total Care    Community resources in place for patient:  X  None  ? Home Health/Home DME ? Assisted Living ? Support Group               Questions/Concerns discussed: This Case Manager called patient to complete post-discharge phone call.  Inquired about patient's status. He denied shortness of breath or swelling in lower extremities.  Patient's medication list thoroughly reviewed, and it was determined patient still needs to pick up isosorbide mononitrate 30 mg daily and amlodipine 5 mg daily. Informed patient he will need to pick up medication. Also per discharge summary,  patient is to stop taking isosorbide dinitrate 20 mg daily and naproxen 500 mg. Patient verbalized understanding.     Also informed patient to adhere to a low sodium diet and begin doing daily weights. Patient indicated he does not have a scale. Informed patient to pick up a scale as soon as able and begin weighing himself daily.  Also informed patient to keep a weight log and bring weight log to his [facility] appointment on 12/09/15 at 0900 with Dr. Jarold Song. Patient verbalized understanding.  Patient uninsured; he indicated he has the [employer]. Informed patient he would benefit from meeting with a Development worker, community at [facility] to determine if he meets criteria for [employer]. Patient verbalized understanding. No additional needs/concerns identified.

## 2015-12-04 ENCOUNTER — Inpatient Hospital Stay: Payer: No Typology Code available for payment source | Admitting: Family Medicine

## 2015-12-04 ENCOUNTER — Telehealth: Payer: Self-pay

## 2015-12-04 NOTE — Telephone Encounter (Signed)
This Case Manager placed call to patient to remind him of his upcoming [facility] appointment on 12/09/15 at 0900. Patient indicated he plans to be at his appointment. Inquired if patient picked up amlodipine 5 mg daily and isosorbide mononitrate 30 mg daily as he had not picked up medications when called on 11/27/15. Patient indicated he now has all discharge medications.  Instructed patient to bring all medications to his appointment on 12/09/15. Patient verbalized understanding. No additional concerns identified.

## 2015-12-09 ENCOUNTER — Inpatient Hospital Stay: Payer: No Typology Code available for payment source | Admitting: Family Medicine

## 2015-12-10 ENCOUNTER — Telehealth: Payer: Self-pay

## 2015-12-10 NOTE — Telephone Encounter (Signed)
This Case Manager placed call to patient to discuss rescheduling appointment with Dr. Jarold Song as patient cancelled his appointment on 12/09/15. Unable to reach patient; voicemail left requesting return call.

## 2015-12-12 ENCOUNTER — Inpatient Hospital Stay: Payer: No Typology Code available for payment source | Admitting: Family Medicine

## 2015-12-15 ENCOUNTER — Telehealth: Payer: Self-pay

## 2015-12-15 NOTE — Telephone Encounter (Signed)
Call placed to the patient to check on his status and to discuss scheduling a follow up appointment. He said that he came to his appointment on 12/09/15 but he was 45 minutes late and the receptionist at the clinic told him that he was too late and he could not be sen by a provider.  He said that he did not have the correct address at first.  This CM apologized that he was not seen and he agreed to reschedule the appointment.  An appointment was offered for 12/16/15 but he said that he could not be there and he asked for an appointment on 12/22/15.  An appointment was then scheduled for 12/22/15 @ 1445.  He said that he has all of his medications because he was given some samples of '"one of them."  He did not have the name of the medication at this time.  He reported no other problems/questions.

## 2015-12-19 ENCOUNTER — Telehealth: Payer: Self-pay

## 2015-12-19 NOTE — Telephone Encounter (Signed)
This Case Manager placed call to patient to remind him of upcoming appointment on 12/22/15 at 1445 with Dr. Jarold Song. Patient verbalized understanding and indicated he would have transportation to his appointment. Reminded patient to bring all medications to his upcoming appointment. Patient verbalized understanding.

## 2015-12-22 ENCOUNTER — Ambulatory Visit: Payer: No Typology Code available for payment source | Attending: Family Medicine | Admitting: Family Medicine

## 2015-12-22 ENCOUNTER — Encounter: Payer: Self-pay | Admitting: Family Medicine

## 2015-12-22 VITALS — BP 155/87 | HR 83 | Temp 98.0°F | Resp 13 | Ht 67.0 in | Wt 287.0 lb

## 2015-12-22 DIAGNOSIS — G4730 Sleep apnea, unspecified: Secondary | ICD-10-CM | POA: Insufficient documentation

## 2015-12-22 DIAGNOSIS — N183 Chronic kidney disease, stage 3 (moderate): Secondary | ICD-10-CM | POA: Insufficient documentation

## 2015-12-22 DIAGNOSIS — I130 Hypertensive heart and chronic kidney disease with heart failure and stage 1 through stage 4 chronic kidney disease, or unspecified chronic kidney disease: Secondary | ICD-10-CM | POA: Insufficient documentation

## 2015-12-22 DIAGNOSIS — I5042 Chronic combined systolic (congestive) and diastolic (congestive) heart failure: Secondary | ICD-10-CM

## 2015-12-22 DIAGNOSIS — E785 Hyperlipidemia, unspecified: Secondary | ICD-10-CM

## 2015-12-22 DIAGNOSIS — Z8674 Personal history of sudden cardiac arrest: Secondary | ICD-10-CM | POA: Insufficient documentation

## 2015-12-22 DIAGNOSIS — R0602 Shortness of breath: Secondary | ICD-10-CM | POA: Insufficient documentation

## 2015-12-22 DIAGNOSIS — Z7982 Long term (current) use of aspirin: Secondary | ICD-10-CM | POA: Insufficient documentation

## 2015-12-22 DIAGNOSIS — N529 Male erectile dysfunction, unspecified: Secondary | ICD-10-CM

## 2015-12-22 DIAGNOSIS — Z95810 Presence of automatic (implantable) cardiac defibrillator: Secondary | ICD-10-CM | POA: Insufficient documentation

## 2015-12-22 DIAGNOSIS — Z79899 Other long term (current) drug therapy: Secondary | ICD-10-CM | POA: Insufficient documentation

## 2015-12-22 DIAGNOSIS — Z955 Presence of coronary angioplasty implant and graft: Secondary | ICD-10-CM | POA: Insufficient documentation

## 2015-12-22 DIAGNOSIS — Z131 Encounter for screening for diabetes mellitus: Secondary | ICD-10-CM

## 2015-12-22 DIAGNOSIS — Z888 Allergy status to other drugs, medicaments and biological substances status: Secondary | ICD-10-CM | POA: Insufficient documentation

## 2015-12-22 DIAGNOSIS — I10 Essential (primary) hypertension: Secondary | ICD-10-CM

## 2015-12-22 DIAGNOSIS — I2510 Atherosclerotic heart disease of native coronary artery without angina pectoris: Secondary | ICD-10-CM | POA: Insufficient documentation

## 2015-12-22 DIAGNOSIS — Z880 Allergy status to penicillin: Secondary | ICD-10-CM | POA: Insufficient documentation

## 2015-12-22 DIAGNOSIS — Z9889 Other specified postprocedural states: Secondary | ICD-10-CM | POA: Insufficient documentation

## 2015-12-22 LAB — POCT GLYCOSYLATED HEMOGLOBIN (HGB A1C): Hemoglobin A1C: 5.9

## 2015-12-22 MED ORDER — ALBUTEROL SULFATE HFA 108 (90 BASE) MCG/ACT IN AERS: 2.0000 | INHALATION_SPRAY | RESPIRATORY_TRACT | Status: DC | PRN

## 2015-12-22 NOTE — Progress Notes (Signed)
Patient here as a new patient after recent hospital stay He has his medications with him many of which are not listed in his chart currently He complains of erectile dysfunction and states this makes him not want to take any of his medications He refuses a flu shot today

## 2015-12-22 NOTE — Progress Notes (Signed)
Subjective:    Patient ID: Victor Thompson, male    DOB: 01-26-1961, 55 y.o.   MRN: [id number]  HPI 55 year old male with a history of chronic combined congestive heart failure (EF 45-50%), hypertension, hyperlipidemia, coronary artery disease (status post stent), history of cardiac arrest (status post ICD placement) who was recently hospitalized at [facility] from 11/24/15-11/26/15 for acute congestive heart failure after he had presented with worsening shortness of breath. On presentation to the ED his troponins were mildly elevated at 0.06 and 0.04, EKG reveals sinus rhythm, LVH, nonspecific T-wave changes in lateral leads, Q waves in anterior leads, chest x-ray revealed that vascular prominence most compatible with congestive changes, BNP was 686.3. He received IV diuresis with resulting of renal function and mild elevation of creatinine from a baseline of 1.9 to 2.15. He was closely followed by cardiology and mildly elevated troponin was thought to be secondary to demand ischemia. His condition improved and he was subsequently discharged with recommendation to follow-up with cardiology outpatient.  Interval history history: He has not been taking his antihypertensives because they cause erection problems hence patient's elevated blood pressure today. He has a couple medications with him which are not listed on his medication list (metoprolol, BiDil, pravastatin) and he informs me he was informed by his cardiologist to take those ones when he ran out of what ever he had on his med list.  Past Medical History  Diagnosis Date  . Coronary artery disease   . Hypertension   . Cardiac arrest ([place])   . Pulmonary edema   . CHF (congestive heart failure) ([place])   . Shortness of breath dyspnea   . AICD (automatic cardioverter/defibrillator) present   . Sleep apnea     USES CPAP    Past Surgical History  Procedure Laterality Date  . Implantable cardioverter defibrillator implant    .  Coronary stent placement      Allergies  Allergen Reactions  . Lidocaine Hcl     REACTION: tongue swelling  . Lisinopril     REACTION: tougne swelling.Pt reported problem with a BP med which sounded like  lisinopril  But as of 09/19/06,pt had tolerated altace without problem  . Penicillins     Cillin family per pt.  Mw,cma    Current Outpatient Prescriptions on File Prior to Visit  Medication Sig Dispense Refill  . albuterol (PROVENTIL HFA;VENTOLIN HFA) 108 (90 BASE) MCG/ACT inhaler Inhale 2 puffs into the lungs every 4 (four) hours as needed for wheezing or shortness of breath. 1 Inhaler 0  . amLODipine (NORVASC) 5 MG tablet Take 1 tablet (5 mg total) by mouth daily. 30 tablet 3  . aspirin EC 81 MG tablet Take 81 mg by mouth daily.    Marland Kitchen atorvastatin (LIPITOR) 40 MG tablet Take 40 mg by mouth daily.    . carvedilol (COREG) 12.5 MG tablet Take 1 tablet (12.5 mg total) by mouth 2 (two) times daily with a meal. 60 tablet 3  . EPINEPHrine 0.3 mg/0.3 mL IJ SOAJ injection Inject 0.3 mLs (0.3 mg total) into the muscle once. 1 Device prn  . furosemide (LASIX) 40 MG tablet Take 1 tablet by mouth daily.  0  . hydrALAZINE (APRESOLINE) 25 MG tablet Take 25 mg by mouth 3 (three) times daily.    Marland Kitchen HYDROcodone-acetaminophen (NORCO) 7.5-325 MG per tablet Take 1 tablet by mouth every 6 (six) hours as needed for moderate pain.    . isosorbide mononitrate (IMDUR) 30 MG 24 hr tablet  Take 1 tablet (30 mg total) by mouth daily. 30 tablet 3  . potassium chloride SA (K-DUR,KLOR-CON) 10 MEQ tablet Take 1 tablet (10 mEq total) by mouth daily. 30 tablet 3   No current facility-administered medications on file prior to visit.     Review of Systems  Constitutional: Negative for activity change and appetite change.  HENT: Negative for sinus pressure and sore throat.   Eyes: Negative for visual disturbance.  Respiratory: Negative for cough, chest tightness and shortness of breath.   Cardiovascular: Negative  for chest pain and leg swelling.  Gastrointestinal: Negative for abdominal pain, diarrhea, constipation and abdominal distention.  Endocrine: Negative.   Genitourinary: Negative for dysuria.       Erectile dysfunction  Musculoskeletal: Negative for myalgias and joint swelling.  Skin: Negative for rash.  Allergic/Immunologic: Negative.   Neurological: Negative for weakness, light-headedness and numbness.  Psychiatric/Behavioral: Negative for suicidal ideas and dysphoric mood.       Objective: Filed Vitals:   12/22/15 1527  BP: 155/87  Pulse: 83  Temp: 98 F (36.7 C)  Resp: 13  Height: 5\' 7"  (1.702 m)  Weight: 287 lb (130.182 kg)  SpO2: 97%      Physical Exam  Constitutional: He is oriented to person, place, and time. He appears well-developed and well-nourished.  Obese  HENT:  Head: Normocephalic and atraumatic.  Right Ear: External ear normal.  Left Ear: External ear normal.  Eyes: Conjunctivae and EOM are normal. Pupils are equal, round, and reactive to light.  Neck: Normal range of motion. Neck supple. No tracheal deviation present.  Cardiovascular: Normal rate, regular rhythm and normal heart sounds.   No murmur heard. Pulmonary/Chest: Effort normal and breath sounds normal. No respiratory distress. He has no wheezes. He exhibits no tenderness.  Abdominal: Soft. Bowel sounds are normal. He exhibits no mass. There is no tenderness.  Musculoskeletal: Normal range of motion. He exhibits no edema or tenderness.  Neurological: He is alert and oriented to person, place, and time.  Skin: Skin is warm and dry.  Psychiatric: He has a normal mood and affect.    Wt Readings from Last 3 Encounters:  12/22/15 287 lb (130.182 kg)  11/25/15 282 lb 11.2 oz (128.232 kg)  09/03/15 298 lb 9.6 oz (135.444 kg)    Transthoracic Echocardiography  Patient:  Andan, Pal MR #:    [id number] Study Date: 11/24/2015 Gender:   M Age:    75 Height:   170.2 cm Weight:    135.2 kg BSA:    2.6 m^2 Pt. Status: Room:    3W04C  ADMITTING  Dixie Dials, MD ATTENDING  Dixie Dials, MD ORDERING   Dixie Dials, MD PERFORMING  Dixie Dials, MD REFERRING  Dixie Dials, MD SONOGRAPHER Darlina Sicilian, RDCS  cc:  ------------------------------------------------------------------- LV EF: 45% -  50%  ------------------------------------------------------------------- Indications:   CHF - 428.0.  ------------------------------------------------------------------- History:  PMH: Edema. Dyspnea. Coronary artery disease. PMH: Cardiac Arrest. Risk factors: Hypertension.  ------------------------------------------------------------------- Study Conclusions  - Left ventricle: The cavity size was mildly dilated. Systolic function was mildly reduced. The estimated ejection fraction was in the range of 45% to 50%. There is moderate hypokinesis of the anterior myocardium. There is mild hypokinesis of the inferior myocardium. Doppler parameters are consistent with abnormal left ventricular relaxation (grade 1 diastolic dysfunction). - Mitral valve: Calcified annulus. There was mild regurgitation. - Left atrium: The atrium was moderately to severely dilated.  ------------------------------------------------------------------- Labs, prior tests, procedures, and surgery: ICD.  Transthoracic echocardiography. M-mode, complete 2D, spectral Doppler, and color Doppler. Birthdate: Patient birthdate: [birth year]. Age: Patient is 55 yr old. Sex: Gender: male.  BMI: 46.7 kg/m^2. Blood pressure:   145/70 Patient status: Inpatient. Study date: Study date: 11/24/2015. Study time: 02:51 PM. Location: Bedside.  -------------------------------------------------------------------  ------------------------------------------------------------------- Left ventricle: The cavity size was mildly dilated.  Systolic function was mildly reduced. The estimated ejection fraction was in the range of 45% to 50%. Regional wall motion abnormalities: There is moderate hypokinesis of the anterior myocardium. There is mild hypokinesis of the inferior myocardium. Doppler parameters are consistent with abnormal left ventricular relaxation (grade 1 diastolic dysfunction).  ------------------------------------------------------------------- Aortic valve:  Trileaflet; normal thickness leaflets. Mobility was not restricted. Doppler: Transvalvular velocity was within the normal range. There was no stenosis. There was no regurgitation.  ------------------------------------------------------------------- Aorta: The aorta was mildly calcified. Aortic root: The aortic root was normal in size.  ------------------------------------------------------------------- Mitral valve:  Calcified annulus. Mobility was not restricted. Doppler: Transvalvular velocity was within the normal range. There was no evidence for stenosis. There was mild regurgitation.  Peak gradient (D): 3 mm Hg.  ------------------------------------------------------------------- Left atrium: The atrium was moderately to severely dilated.  ------------------------------------------------------------------- Right ventricle: The cavity size was normal. Wall thickness was normal. Systolic function was normal.  ------------------------------------------------------------------- Pulmonic valve:  The valve appears to be grossly normal. Doppler: Transvalvular velocity was within the normal range. There was no evidence for stenosis. There was trivial regurgitation.  ------------------------------------------------------------------- Tricuspid valve:  Structurally normal valve.  Doppler: Transvalvular velocity was within the normal range. There was trivial  regurgitation.  ------------------------------------------------------------------- Pulmonary artery:  The main pulmonary artery was normal-sized. Systolic pressure was within the normal range.  ------------------------------------------------------------------- Right atrium: The atrium was normal in size.  ------------------------------------------------------------------- Pericardium: There was no pericardial effusion.  ------------------------------------------------------------------- Systemic veins: Inferior vena cava: The vessel was normal in size. The respirophasic diameter changes were in the normal range (>= 50%), consistent with normal central venous pressure.  ------------------------------------------------------------------- Post procedure conclusions Ascending Aorta:  - The aorta was mildly calcified.     CMP Latest Ref Rng 11/26/2015 11/25/2015 11/24/2015  Glucose 65 - 99 mg/dL 84 108(H) 117(H)  BUN 6 - 20 mg/dL 19 22(H) 24(H)  Creatinine 0.61 - 1.24 mg/dL 1.99(H) 2.15(H) 2.09(H)  Sodium 135 - 145 mmol/L 139 142 138  Potassium 3.5 - 5.1 mmol/L 4.3 3.7 4.1  Chloride 101 - 111 mmol/L 106 107 110  CO2 22 - 32 mmol/L 22 27 18(L)  Calcium 8.9 - 10.3 mg/dL 8.5(L) 8.2(L) 8.8(L)  Total Protein 6.5 - 8.1 g/dL - - -  Total Bilirubin 0.3 - 1.2 mg/dL - - -  Alkaline Phos 38 - 126 U/L - - -  AST 15 - 41 U/L - - -  ALT 17 - 63 U/L - - -     Assessment & Plan:  55 year old male with the above medical history who comes in today to establish care; he does have duplicates of medications with him and is unsure of what he takes-med review revealed both atorvastatin and pravastatin, metoprolol and carvedilol, hydralazine, isosorbide and Bidil. I have personally sorted his medications out and he will be seeing his cardiologist tomorrow for validation  Congestive heart failure: EF of 45-50% with grade 1 diastolic dysfunction from 2-D echo of 11/2015 Euvolemic at this  time. Gained 5lbs since discharge 2 weeks ago Advised to limit fluid intake to less than 2 L per day, daily weight checks, sodium restriction, heart healthy diet.  Coronary  artery disease: Status post stent Continue aggressive risk factor modification. We have called his cardiologist's office to obtain an appointment for him tomorrow.  Hypertension: Uncontrolled -due to the fact that he stopped his antihypertensives which he states were causing erectile problem. Low-sodium diet.  Hyperlipidemia: Continue Lipitor. Low cholesterol diet  Chronic kidney disease: Stage III. Advised to avoid nephrotoxic agent. Will need to monitor renal function closely especially since he is on a diuretic.  Erectile dysfunction: I have explained to him that due to the fact that he is on a nitrate he is not a candidate for erection enhancing medications. He has expressed the fact that he wants to come off his nitrates and I have informed him this will need to be discussed with his cardiologist.  This note has been created with Dragon speech recognition software and smart Company secretary. Any transcriptional errors are unintentional.

## 2015-12-28 ENCOUNTER — Encounter (HOSPITAL_COMMUNITY): Payer: Self-pay | Admitting: Emergency Medicine

## 2015-12-28 ENCOUNTER — Emergency Department (HOSPITAL_COMMUNITY)
Admission: EM | Admit: 2015-12-28 | Discharge: 2015-12-28 | Disposition: A | Discharge status: Nursing Home (Includes ICF) | Payer: No Typology Code available for payment source | Source: Home / Self Care

## 2015-12-28 ENCOUNTER — Encounter (HOSPITAL_COMMUNITY): Payer: Self-pay | Admitting: *Deleted

## 2015-12-28 ENCOUNTER — Emergency Department (HOSPITAL_COMMUNITY)
Admission: EM | Admit: 2015-12-28 | Discharge: 2015-12-28 | Disposition: A | Discharge status: Home / Self Care | Payer: No Typology Code available for payment source | Attending: Emergency Medicine | Admitting: Emergency Medicine

## 2015-12-28 DIAGNOSIS — Z9981 Dependence on supplemental oxygen: Secondary | ICD-10-CM | POA: Insufficient documentation

## 2015-12-28 DIAGNOSIS — Z8709 Personal history of other diseases of the respiratory system: Secondary | ICD-10-CM | POA: Insufficient documentation

## 2015-12-28 DIAGNOSIS — Z7982 Long term (current) use of aspirin: Secondary | ICD-10-CM | POA: Insufficient documentation

## 2015-12-28 DIAGNOSIS — Z79899 Other long term (current) drug therapy: Secondary | ICD-10-CM | POA: Insufficient documentation

## 2015-12-28 DIAGNOSIS — Z95810 Presence of automatic (implantable) cardiac defibrillator: Secondary | ICD-10-CM | POA: Insufficient documentation

## 2015-12-28 DIAGNOSIS — G4730 Sleep apnea, unspecified: Secondary | ICD-10-CM | POA: Insufficient documentation

## 2015-12-28 DIAGNOSIS — Z9861 Coronary angioplasty status: Secondary | ICD-10-CM | POA: Insufficient documentation

## 2015-12-28 DIAGNOSIS — X58XXXS Exposure to other specified factors, sequela: Secondary | ICD-10-CM | POA: Insufficient documentation

## 2015-12-28 DIAGNOSIS — Z87891 Personal history of nicotine dependence: Secondary | ICD-10-CM | POA: Insufficient documentation

## 2015-12-28 DIAGNOSIS — I2510 Atherosclerotic heart disease of native coronary artery without angina pectoris: Secondary | ICD-10-CM | POA: Insufficient documentation

## 2015-12-28 DIAGNOSIS — I10 Essential (primary) hypertension: Secondary | ICD-10-CM | POA: Insufficient documentation

## 2015-12-28 DIAGNOSIS — I509 Heart failure, unspecified: Secondary | ICD-10-CM | POA: Insufficient documentation

## 2015-12-28 DIAGNOSIS — T783XXS Angioneurotic edema, sequela: Secondary | ICD-10-CM | POA: Insufficient documentation

## 2015-12-28 DIAGNOSIS — T7840XA Allergy, unspecified, initial encounter: Secondary | ICD-10-CM

## 2015-12-28 DIAGNOSIS — Z880 Allergy status to penicillin: Secondary | ICD-10-CM | POA: Insufficient documentation

## 2015-12-28 MED ORDER — EPINEPHRINE HCL 1 MG/ML IJ SOLN
INTRAMUSCULAR | Status: AC
  Filled 2015-12-28: qty 1

## 2015-12-28 MED ORDER — METHYLPREDNISOLONE SODIUM SUCC 125 MG IJ SOLR
INTRAMUSCULAR | Status: AC
  Filled 2015-12-28: qty 2

## 2015-12-28 MED ORDER — DIPHENHYDRAMINE HCL 50 MG/ML IJ SOLN
INTRAMUSCULAR | Status: AC
  Filled 2015-12-28: qty 1

## 2015-12-28 MED ORDER — DIPHENHYDRAMINE HCL 50 MG/ML IJ SOLN
50.0000 mg | Freq: Once | INTRAMUSCULAR | Status: AC
  Administered 2015-12-28: 50 mg via INTRAMUSCULAR

## 2015-12-28 MED ORDER — METHYLPREDNISOLONE SODIUM SUCC 125 MG IJ SOLR: 125.0000 mg | Freq: Once | INTRAMUSCULAR | Status: DC

## 2015-12-28 MED ORDER — EPINEPHRINE HCL 1 MG/ML IJ SOLN
0.3000 mg | Freq: Once | INTRAMUSCULAR | Status: AC
  Administered 2015-12-28: 0.3 mg via INTRAMUSCULAR

## 2015-12-28 MED ORDER — METHYLPREDNISOLONE SODIUM SUCC 125 MG IJ SOLR
125.0000 mg | Freq: Once | INTRAMUSCULAR | Status: AC
  Administered 2015-12-28: 125 mg via INTRAMUSCULAR

## 2015-12-28 MED ORDER — SODIUM CHLORIDE 0.9 % IV SOLN
Freq: Once | INTRAVENOUS | Status: DC
Start: 2015-12-28 — End: 2015-12-28

## 2015-12-28 NOTE — ED Provider Notes (Signed)
CSN: [id number]     Arrival date & time 12/28/15  1628 History   First MD Initiated Contact with Patient 12/28/15 1632     Chief Complaint  Patient presents with  . Oral Swelling     (Consider location/radiation/quality/duration/timing/severity/associated sxs/prior Treatment) HPI  55 year old male who presents with concern for allergic reaction. History of coronary artery disease complicated by cardiac arrest status post AICD placement, CHF, and hypertension. States that he has been having frequent allergic reaction since 2013, and has had multiple workups for this. 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 States that typically his symptoms resolve after giving himself Benadryl and EpiPen. States that he has been in his usual state of health, and after waking up from a nap at 1 PM he noted itchiness to the palms of his hand. He initially took Benadryl, but subsequently felt tingling and swelling involving the back of his throat as well as his upper lip. Subsequently gave himself an EpiPen. Presented to urgent care. On arrival he states that his symptoms had fully resolved, but was told by providers that to be safe I he should be given additional epinephrine. Subsequently received a second dose of IM epinephrine at around 3 PM. Also received 125 mg of Solu-Medrol and 50 mg of Benadryl. Was sent to the ED for further evaluation. States that he felt significant palpitations after receiving his second epinephrine dose. Says that this is now resolved, and he feels that he is back at baseline. Denies any current swelling of his lip or oropharynx or posterior oropharynx. Denies any hives, difficulty breathing or wheezing, chest pain, nausea or vomiting, or abdominal pain. Denies any new food exposures today, new medications, or any other new allergens that he can think of.  Past Medical History  Diagnosis Date  . Coronary artery disease   .  Hypertension   . Cardiac arrest ([place])   . Pulmonary edema   . CHF (congestive heart failure) ([place])   . Shortness of breath dyspnea   . AICD (automatic cardioverter/defibrillator) present   . Sleep apnea     USES CPAP   Past Surgical History  Procedure Laterality Date  . Implantable cardioverter defibrillator implant    . Coronary stent placement     No family history on file. Social History  Substance Use Topics  . Smoking status: Former Smoker    Quit date: 12/06/2008  . Smokeless tobacco: Never Used  . Alcohol Use: No    Review of Systems 10/14 systems reviewed and are negative other than those stated in the HPI    Allergies  Lidocaine hcl; Lisinopril; and Penicillins  Home Medications   Prior to Admission medications   Medication Sig Start Date End Date Taking? Authorizing Provider  albuterol (PROVENTIL HFA;VENTOLIN HFA) 108 (90 Base) MCG/ACT inhaler Inhale 2 puffs into the lungs every 4 (four) hours as needed for wheezing or shortness of breath. 12/22/15  Yes Arnoldo Morale, MD  amLODipine (NORVASC) 5 MG tablet Take 1 tablet (5 mg total) by mouth daily. 11/26/15  Yes Dixie Dials, MD  aspirin EC 81 MG tablet Take 81 mg by mouth daily.   Yes Historical Provider, MD  atorvastatin (LIPITOR) 40 MG tablet Take 40 mg by mouth at bedtime.    Yes Historical Provider, MD  beclomethasone (QVAR) 40 MCG/ACT inhaler Inhale 2 puffs into the lungs 2 (two) times daily as needed (shortness of breath/ wheezing).    Yes Historical Provider, MD  EPINEPHrine 0.3 mg/0.3 mL IJ SOAJ injection Inject 0.3 mLs (0.3 mg total) into the muscle once. Patient taking differently: Inject 0.3 mg into the muscle once as needed (severe allergic reactions).  09/03/15  Yes Deneise Lever, MD  furosemide (LASIX) 40 MG tablet Take 40 mg by mouth at bedtime.  06/25/15  Yes Historical Provider, MD  isosorbide-hydrALAZINE (BIDIL) 20-37.5 MG tablet Take 1 tablet by mouth 3 (three) times daily.    Yes Historical  Provider, MD  loratadine (CLARITIN) 10 MG tablet Take 10 mg by mouth daily as needed for allergies.   Yes Historical Provider, MD  metoprolol tartrate (LOPRESSOR) 25 MG tablet Take 25 mg by mouth 2 (two) times daily.   Yes Historical Provider, MD  naproxen sodium (ALEVE) 220 MG tablet Take 220 mg by mouth 2 (two) times daily as needed (pain).   Yes Historical Provider, MD  potassium chloride SA (K-DUR,KLOR-CON) 10 MEQ tablet Take 1 tablet (10 mEq total) by mouth daily. Patient taking differently: Take 10 mEq by mouth at bedtime.  11/26/15  Yes Dixie Dials, MD  PRESCRIPTION MEDICATION Inhale into the lungs at bedtime. CPAP   Yes Historical Provider, MD  Triamcinolone Acetonide (NASACORT AQ NA) Place 1 spray into both nostrils daily as needed (congestion).   Yes Historical Provider, MD  carvedilol (COREG) 12.5 MG tablet Take 1 tablet (12.5 mg total) by mouth 2 (two) times daily with a meal. Patient not taking: Reported on 12/28/2015 11/26/15   Dixie Dials, MD  isosorbide mononitrate (IMDUR) 30 MG 24 hr tablet Take 1 tablet (30 mg total) by mouth daily. Patient not taking: Reported on 12/28/2015 11/26/15   Dixie Dials, MD   BP 145/59 mmHg  Pulse 86  Temp(Src) 98.8 F (37.1 C) (Oral)  Resp 18  SpO2 98% Physical Exam Physical Exam  Nursing note and vitals reviewed. Constitutional: Well developed, well nourished, non-toxic, and in no acute distress Head: Normocephalic and atraumatic.  Mouth/Throat: Oropharynx is clear and moist.  Neck: Normal range of motion. Neck supple.  Cardiovascular: Normal rate and regular rhythm.   Pulmonary/Chest: Effort normal and breath sounds normal.  Abdominal: Soft. There is no tenderness. There is no rebound and no guarding.  Musculoskeletal: Normal range of motion.  Neurological: Alert, no facial droop, fluent speech, moves all extremities symmetrically Skin: Skin is warm and dry.  Psychiatric: Cooperative  ED Course  Procedures (including critical care  time) Labs Review Labs Reviewed - No data to display  Imaging Review No results found. I have personally reviewed and evaluated these images and lab results as part of my medical decision-making.   EKG Interpretation None      MDM   Final diagnoses:  Angioedema, sequela    55 year old male who presents for evaluation after potential angioedema versus allergic reaction earlier today. States that he is asymptomatic on my evaluation. He has stable vital signs, and no signs of angioedema or other signs of anaphylaxsis or allergic reaction. Seems to have resolved after administration of epinephrine. He is observed here in the emergency department, and does not have any recurrence of symptoms. Appropriate for discharge home. Reviewed strict return and follow-up instructions. He expressed understanding of all discharge instructions and felt comfortable with the plan of care.    Forde Dandy, MD 12/29/15 Laureen Abrahams

## 2015-12-28 NOTE — ED Notes (Signed)
Pt wants to know why does he still have to go to the ER if he is feeling better... Notified Bard Herbert, NP

## 2015-12-28 NOTE — ED Notes (Signed)
MD at bedside. 

## 2015-12-28 NOTE — ED Notes (Signed)
Pt presents via [facility] for [facility] for oral swelling.  Pt reports taking a nap and woke up feeling like he had a "lump" in his throat.  Pt administered epi pen at home and benedryl at home.  Pt then went to UC, revieved 0.3 epi, 50 benedryl, 125 Soulmedrol.  Pt a x 4, no swelling noted.  Airway intact, denies SOB, reports voice sounds normal now.  BP 164/95 P-74 O2-95% RA.

## 2015-12-28 NOTE — ED Provider Notes (Signed)
CSN: [id number]     Arrival date & time 12/28/15  1441 History   None    Chief Complaint  Patient presents with  . Allergic Reaction   (Consider location/radiation/quality/duration/timing/severity/associated sxs/prior Treatment)  HPI   The patient is a 55 year old male presents today with complaints of angioedema and shortness of breath. Patient has a history of acute allergic reaction. Patient used his EpiPen at 1340 today. Patient states he woke up from a nap with numbness and tingling in the hands, feet, and face. Patient states he has had this happen before I knew to use his EpiPen. Patient presented here following injection. Patient states he is not sure what he is allergic to, but that he has been told that it is lots of things like roaches and dust.  Past Medical History  Diagnosis Date  . Coronary artery disease   . Hypertension   . Cardiac arrest ([place])   . Pulmonary edema   . CHF (congestive heart failure) ([place])   . Shortness of breath dyspnea   . AICD (automatic cardioverter/defibrillator) present   . Sleep apnea     USES CPAP   Past Surgical History  Procedure Laterality Date  . Implantable cardioverter defibrillator implant    . Coronary stent placement     No family history on file. Social History  Substance Use Topics  . Smoking status: Former Smoker    Quit date: 12/06/2008  . Smokeless tobacco: Never Used  . Alcohol Use: No    Review of Systems  Constitutional: Negative.  Negative for fever and chills.  HENT: Positive for facial swelling, trouble swallowing and voice change. Negative for congestion, sinus pressure, sneezing and sore throat.   Eyes: Negative.   Respiratory: Positive for shortness of breath. Negative for cough, wheezing and stridor.   Cardiovascular: Negative.   Gastrointestinal: Negative.   Endocrine: Negative.   Genitourinary: Negative.   Musculoskeletal: Negative.   Skin: Negative.  Negative for pallor and rash.   Allergic/Immunologic: Positive for environmental allergies. Negative for food allergies and immunocompromised state.  Neurological: Negative.   Hematological: Negative.   Psychiatric/Behavioral: Negative.     Allergies  Lidocaine hcl; Lisinopril; and Penicillins  Home Medications   Prior to Admission medications   Medication Sig Start Date End Date Taking? Authorizing Provider  amLODipine (NORVASC) 5 MG tablet Take 1 tablet (5 mg total) by mouth daily. 11/26/15  Yes Dixie Dials, MD  aspirin EC 81 MG tablet Take 81 mg by mouth daily.   Yes Historical Provider, MD  atorvastatin (LIPITOR) 40 MG tablet Take 40 mg by mouth daily.   Yes Historical Provider, MD  EPINEPHrine 0.3 mg/0.3 mL IJ SOAJ injection Inject 0.3 mLs (0.3 mg total) into the muscle once. 09/03/15  Yes Deneise Lever, MD  furosemide (LASIX) 40 MG tablet Take 1 tablet by mouth daily. 06/25/15  Yes Historical Provider, MD  hydrALAZINE (APRESOLINE) 25 MG tablet Take 25 mg by mouth 3 (three) times daily.   Yes Historical Provider, MD  metoprolol tartrate (LOPRESSOR) 25 MG tablet Take 25 mg by mouth 2 (two) times daily.   Yes Historical Provider, MD  potassium chloride SA (K-DUR,KLOR-CON) 10 MEQ tablet Take 1 tablet (10 mEq total) by mouth daily. 11/26/15  Yes Dixie Dials, MD  pravastatin (PRAVACHOL) 40 MG tablet Take 40 mg by mouth daily.   Yes Historical Provider, MD  albuterol (PROVENTIL HFA;VENTOLIN HFA) 108 (90 Base) MCG/ACT inhaler Inhale 2 puffs into the lungs every 4 (four) hours as needed  for wheezing or shortness of breath. 12/22/15   Arnoldo Morale, MD  beclomethasone (QVAR) 40 MCG/ACT inhaler Inhale into the lungs 2 (two) times daily.    Historical Provider, MD  carvedilol (COREG) 12.5 MG tablet Take 1 tablet (12.5 mg total) by mouth 2 (two) times daily with a meal. 11/26/15   Dixie Dials, MD  HYDROcodone-acetaminophen ([place]) 7.5-325 MG per tablet Take 1 tablet by mouth every 6 (six) hours as needed for moderate pain.  Reported on 12/22/2015    Historical Provider, MD  isosorbide mononitrate (IMDUR) 30 MG 24 hr tablet Take 1 tablet (30 mg total) by mouth daily. 11/26/15   Dixie Dials, MD  isosorbide-hydrALAZINE (BIDIL) 20-37.5 MG tablet Take by mouth 3 (three) times daily.    Historical Provider, MD   Meds Ordered and Administered this Visit   Medications  0.9 %  sodium chloride infusion (not administered)  methylPREDNISolone sodium succinate (SOLU-MEDROL) 125 mg/2 mL injection 125 mg (not administered)  diphenhydrAMINE (BENADRYL) injection 50 mg (not administered)  EPINEPHrine (ADRENALIN) injection 0.3 mg (not administered)      BP 132/88 mmHg  Pulse 81  Temp(Src) 97.1 F (36.2 C) (Oral)  Resp 18  SpO2 95% No data found.   Physical Exam  Constitutional: He is oriented to person, place, and time. He appears well-developed and well-nourished. No distress.  HENT:  Head: Normocephalic and atraumatic.  The patient's lips and tongue appear swollen and enlarged. The patient reports they still have a tingling sensation.  Neck: Normal range of motion. Neck supple.  Cardiovascular: Normal rate, regular rhythm, normal heart sounds and intact distal pulses.  Exam reveals no gallop and no friction rub.   No murmur heard. Pulmonary/Chest: Effort normal and breath sounds normal. No respiratory distress. He has no wheezes. He has no rales. He exhibits no tenderness.  Neurological: He is alert and oriented to person, place, and time.  Skin: Skin is warm and dry. He is not diaphoretic.  Nursing note and vitals reviewed.  Decreased angio edema noted upon examination at 1605.  No respiratory distress noted.  Able to visualize uvula and patient able to enunciate words more clearly.  Uvula does not appear enlarged .   ED Course  Procedures (including critical care time)  Labs Review Labs Reviewed - No data to display  Imaging Review No results found.   Plan of care was discussed with Dr. Juventino Slovak.  Orders  for IV and cardiac monitoring.  Transfer to [facility] ED for further monitoring.   Repeat Epi 0.3 IM, 125mg  solumedrol and 50mg  benadryl.    The patient refuses to be transferred to [facility] ED, states he is better and he does this "all the time".  The patient advised that I did not agree and that he would have to sign out AMA if he wished to go home.  The patient states he will sign AMA and has someone to drive him home.  States he will not be alone and has another EPI PEN at home. Discussed the importance of contacting 911 immediately if symptoms return.  He verbalized understanding.     The transport team arrived while planning the patient's AMA d/c and he decided to go ahead with transfer.  Was sent to [facility] ED as planned.   MDM   1. Acute allergic reaction, initial encounter    Meds ordered this encounter  Medications  . 0.9 %  sodium chloride infusion    Sig:   . DISCONTD: methylPREDNISolone sodium succinate (SOLU-MEDROL) 125  mg/2 mL injection 125 mg    Sig:   . diphenhydrAMINE (BENADRYL) injection 50 mg    Sig:   . EPINEPHrine (ADRENALIN) injection 0.3 mg    Sig:   . methylPREDNISolone sodium succinate (SOLU-MEDROL) 125 mg/2 mL injection 125 mg    Sig:    Plan of care was discussed with Dr. Juventino Slovak.  He is aware of patients wish to sign out AMA.  Patient finally agreed to transfer to [facility] ED.       Nehemiah Settle, NP 12/28/15 1927

## 2015-12-28 NOTE — ED Notes (Signed)
Pt reports woke up from a nap experiencing numbness/tingly of lips, bilateral hands and feet onset 1330 States he took a benadryl and at 1348 used his Epi-pen w/some relief He is A&O x4... No acute distress.

## 2015-12-28 NOTE — ED Notes (Signed)
IV unsuccessful attempt x2 -- Tilford Pillar, CMA

## 2015-12-28 NOTE — Discharge Instructions (Signed)
Return without fail for worsening symptoms, including difficulty breathing, swelling of mouth, throat or tongue, passing out, chest pain, or any other symptoms concerning to you.

## 2015-12-29 ENCOUNTER — Other Ambulatory Visit: Payer: Self-pay | Admitting: *Deleted

## 2015-12-29 MED ORDER — ALBUTEROL SULFATE HFA 108 (90 BASE) MCG/ACT IN AERS: 2.0000 | INHALATION_SPRAY | RESPIRATORY_TRACT | Status: DC | PRN

## 2015-12-29 MED FILL — VENTOLIN HFA 90 MCG INHALER: 108 (90 BAS | 25 days supply | Qty: 18 | Fill #0

## 2015-12-29 NOTE — Telephone Encounter (Signed)
Patient walked into clinic stating that he needed his albuterol inhaler.  According to Epic the Rx was printed at his last visit.  Patient states he never got a prescription.  Resending to our pharmacy.

## 2015-12-30 MED FILL — ?FUROSEMIDE 40 MG TABLET: 40 | 30 days supply | Qty: 30 | Fill #0

## 2015-12-30 MED FILL — POTASSIUM CL 10 MEQ TAB SA: 10 | 30 days supply | Qty: 30 | Fill #0

## 2015-12-30 MED FILL — AMLODIPINE BESYLATE 10 MG T: 10 | 30 days supply | Qty: 30 | Fill #0

## 2016-01-05 ENCOUNTER — Ambulatory Visit (INDEPENDENT_AMBULATORY_CARE_PROVIDER_SITE_OTHER): Payer: No Typology Code available for payment source | Admitting: Internal Medicine

## 2016-01-05 ENCOUNTER — Encounter: Payer: Self-pay | Admitting: Internal Medicine

## 2016-01-05 ENCOUNTER — Other Ambulatory Visit: Payer: No Typology Code available for payment source

## 2016-01-05 VITALS — BP 148/88 | HR 69 | Ht 67.0 in | Wt 288.4 lb

## 2016-01-05 DIAGNOSIS — T783XXA Angioneurotic edema, initial encounter: Secondary | ICD-10-CM

## 2016-01-05 DIAGNOSIS — D869 Sarcoidosis, unspecified: Secondary | ICD-10-CM

## 2016-01-05 MED ORDER — MONTELUKAST SODIUM 10 MG PO TABS: 10.0000 mg | ORAL_TABLET | Freq: Every day | ORAL | Status: DC

## 2016-01-05 MED ORDER — FAMOTIDINE 20 MG PO TABS: 20.0000 mg | ORAL_TABLET | Freq: Every day | ORAL | Status: DC

## 2016-01-05 MED FILL — ?FAMOTIDINE 20 MG TABLET: 20 | 30 days supply | Qty: 30 | Fill #0

## 2016-01-05 MED FILL — ?MONTELUKAST SOD 10 MG TAB: 10 | 30 days supply | Qty: 30 | Fill #0

## 2016-01-05 NOTE — Patient Instructions (Signed)
Continue daily loratadine/ Claritin   1 daily (H1 antihistamine)  Script sent for singulair   Anti-inflammatory    1 daily  Script sent for Pepcid   1 daily (H2 antihistamine)  Order - lab ACE level    Dx sarcoid  We printed off the results of your food and environmental allergy antibody tests. Use these only as a "watch list". We don't know that these have anything to do with your angioedema and hives, but they might help you spot a trigger.

## 2016-01-05 NOTE — Progress Notes (Signed)
09/03/15- 19 yoM former smoker hosp f/u. Pt was seen for acute angioedema. Pt states swelling has resolved. Pt c/o SOB with exertion. Pt c/o of LE edema for several weeks. Hx ICM, CAD, OSA, CHF, Asthma From DC summary: He started having episodes with face swelling in 2013, and currently has 3-4 episodes per year. He has never had an episode this severe. He has not had a workup for these, but was under the impression that they were allergic in nature. He generally has an epi-pen and self-administers epinephrine, which usually causes his symptoms to resolve. He has not identified any triggers. He does not report any urticaria that accompanies his face swelling. No hx of recent ACE-inhibitor or ARB exposure. In the ED, he was treated with steroids, antihistamines, nebulized racimic epinpheine, was evaluated by anesthesia and ENT, and in conference, it was elected to treat for acute C1-esterase inhibitor deficiency with C1-esterase inhibitor concentrate( [place]). The patient was admitted for further evaluation. He had significant improvement in symptoms. Hospital course complicated by acute on chronic renal failure with serum creatinine rising to 2.3. Home blood pressure regimen was restarted prior to discharge. Home lasix and HCTZ were held due to AKI. Lab: C1 estrerase inhibitor level  31( 21-39) on 8/27; Tryptase 16.6 (H); sed rate, CRP, C4 complement were Nl, LFTs w/o enzyme elevation Dr Doylene Canard is cardiologist-recent appointment with labs drawn. The history is that this gentleman began experiencing angioedema while incarcerated in 2013. Initial episodes were happening every few weeks but subsequently have been perennial, about once a month. He is going to the emergency room several times. He has used an EpiPen which does accelerate clearing, but is currently unaffordable. He has not recognized an obvious trigger and has not been on Ace inhibitors or ARBs in a very long time. No recognized problem with  latex, aspirin, specific foods. He denies history of environmental allergy/rhinitis/asthma. He has had previous reactions to lidocaine, lisinopril, penicillins. He denies any similar family history. He has a diagnosis of obstructive sleep apnea with at least 2 previous sleep studies elsewhere. Currently no DME company and his CPAP machine does not work and has not been used in a long time.  01/05/2016-55 year old male former smoker followed for angioedema, OSA complicated by chronic kidney disease, HBP Follows For: Angioedema. Pt reports 2 recent episodes of angioedema that he did use his Epipen for and symptoms resolved. Pt does not know what is triggering these episodes. Pt states that it starts with tingling and then swelling that includes hands, feet and face.  Allergy labs 09/03/2015- alpha gal NEG, total IgE 151, elevated food IgE for wheat, peanut, soybean, corn, tomato, orange, apple;  Elevated monocytes on peripheral smear, sedimentation rate 29, elevated environmental allergen IgE for dust mites, dog, grass pollens, tree pollens, fall weeds. Angioedema panel negative. One episode of angioedema before Christmas had one episode after. Much less frequent than before. He recognizes prodrome of tingling in his hands and episodes start. EpiPen and Benadryl was slow it down until he gets to hospital for treatment. No airway involvement. Between episodes he is well. Triggers still not recognized.  ROS-see HPI   Negative unless "+" Constitutional:    weight loss, night sweats, fevers, chills, fatigue, lassitude. HEENT:    headaches, difficulty swallowing, tooth/dental problems, sore throat,       sneezing, itching, ear ache, nasal congestion, post nasal drip, snoring CV:    chest pain, orthopnea, PND, swelling in lower extremities, anasarca,  dizziness, palpitations Resp:   shortness of breath with exertion or at rest.                productive  cough,   non-productive cough, coughing up of blood.              change in color of mucus.  wheezing.   Skin:    +HPI GI:  No-   heartburn, indigestion, abdominal pain, nausea, vomiting, diarrhea,                 change in bowel habits, loss of appetite GU: dysuria, change in color of urine, no urgency or frequency.   flank pain. MS:   joint pain, stiffness, decreased range of motion, back pain. Neuro-     nothing unusual Psych:  change in mood or affect.  depression or anxiety.   memory loss.  OBJ- Physical Exam General- Alert, Oriented, Affect-appropriate, Distress- none acute, + obese Skin- + pink area right bicep he thinks might be residual from most recent set of hives a couple of weeks ago. Lymphadenopathy- none Head- atraumatic            Eyes- Gross vision intact, PERRLA, conjunctivae and secretions clear            Ears- Hearing, canals-normal            Nose- Clear, no-Septal dev, mucus, polyps, erosion, perforation             Throat- Mallampati IV , mucosa clear , drainage- none, tonsils- atrophic Neck- flexible , trachea midline, no stridor , thyroid nl, carotid no bruit Chest - symmetrical excursion , unlabored           Heart/CV- RRR , no murmur , no gallop  , no rub, nl s1 s2                           - JVD- none , edema- none, stasis changes- none, varices- none           Lung- clear to P&A, wheeze- none, cough- none , dullness-none, rub- none           Chest wall-  Abd-  Br/ Gen/ Rectal- Not done, not indicated Extrem- cyanosis- none, clubbing, none, atrophy- none, strength- nl Neuro- grossly intact to observation

## 2016-01-06 LAB — ANGIOTENSIN CONVERTING ENZYME: Angiotensin-Converting Enzyme: 44 U/L (ref 8–52)

## 2016-01-07 ENCOUNTER — Ambulatory Visit: Payer: No Typology Code available for payment source | Attending: Family Medicine

## 2016-01-10 NOTE — Assessment & Plan Note (Signed)
He has not recognized symptoms directly related to foods or other exposures. We reviewed the list of foods with increased IgE levels as a "watch" list so he pays more attention. Environmental allergy panel might be associated with allergic rhinitis or an allergic asthma, but does not point to a specific exposure trigger for his angioedema/urticaria. We will check a ACE level for sarcoid. Plan-ACE level. Again discussed maintenance prophylaxis with H1 and H2 antihistamines, use of EpiPen/Benadryl if needed. Consider Xolair

## 2016-01-22 ENCOUNTER — Ambulatory Visit: Payer: Self-pay | Attending: Family Medicine | Admitting: Family Medicine

## 2016-01-22 ENCOUNTER — Telehealth: Payer: Self-pay | Admitting: Gastroenterology

## 2016-01-22 ENCOUNTER — Encounter: Payer: Self-pay | Admitting: Family Medicine

## 2016-01-22 VITALS — BP 127/84 | HR 62 | Temp 97.9°F | Resp 16 | Ht 67.0 in | Wt 286.0 lb

## 2016-01-22 DIAGNOSIS — I5042 Chronic combined systolic (congestive) and diastolic (congestive) heart failure: Secondary | ICD-10-CM | POA: Insufficient documentation

## 2016-01-22 DIAGNOSIS — Z114 Encounter for screening for human immunodeficiency virus [HIV]: Secondary | ICD-10-CM | POA: Insufficient documentation

## 2016-01-22 DIAGNOSIS — N183 Chronic kidney disease, stage 3 unspecified: Secondary | ICD-10-CM | POA: Insufficient documentation

## 2016-01-22 DIAGNOSIS — N529 Male erectile dysfunction, unspecified: Secondary | ICD-10-CM | POA: Insufficient documentation

## 2016-01-22 DIAGNOSIS — J4520 Mild intermittent asthma, uncomplicated: Secondary | ICD-10-CM | POA: Insufficient documentation

## 2016-01-22 DIAGNOSIS — I5020 Unspecified systolic (congestive) heart failure: Secondary | ICD-10-CM | POA: Insufficient documentation

## 2016-01-22 DIAGNOSIS — E785 Hyperlipidemia, unspecified: Secondary | ICD-10-CM | POA: Insufficient documentation

## 2016-01-22 DIAGNOSIS — Z0000 Encounter for general adult medical examination without abnormal findings: Secondary | ICD-10-CM

## 2016-01-22 DIAGNOSIS — Z87891 Personal history of nicotine dependence: Secondary | ICD-10-CM | POA: Insufficient documentation

## 2016-01-22 DIAGNOSIS — F17200 Nicotine dependence, unspecified, uncomplicated: Secondary | ICD-10-CM

## 2016-01-22 DIAGNOSIS — Z79899 Other long term (current) drug therapy: Secondary | ICD-10-CM | POA: Insufficient documentation

## 2016-01-22 DIAGNOSIS — Z1159 Encounter for screening for other viral diseases: Secondary | ICD-10-CM

## 2016-01-22 DIAGNOSIS — J309 Allergic rhinitis, unspecified: Secondary | ICD-10-CM | POA: Insufficient documentation

## 2016-01-22 DIAGNOSIS — I255 Ischemic cardiomyopathy: Secondary | ICD-10-CM | POA: Insufficient documentation

## 2016-01-22 DIAGNOSIS — K219 Gastro-esophageal reflux disease without esophagitis: Secondary | ICD-10-CM | POA: Insufficient documentation

## 2016-01-22 DIAGNOSIS — Z7982 Long term (current) use of aspirin: Secondary | ICD-10-CM | POA: Insufficient documentation

## 2016-01-22 DIAGNOSIS — I129 Hypertensive chronic kidney disease with stage 1 through stage 4 chronic kidney disease, or unspecified chronic kidney disease: Secondary | ICD-10-CM | POA: Insufficient documentation

## 2016-01-22 DIAGNOSIS — I2510 Atherosclerotic heart disease of native coronary artery without angina pectoris: Secondary | ICD-10-CM | POA: Insufficient documentation

## 2016-01-22 DIAGNOSIS — I10 Essential (primary) hypertension: Secondary | ICD-10-CM | POA: Insufficient documentation

## 2016-01-22 LAB — COMPLETE METABOLIC PANEL WITH GFR
ALT: 20 U/L (ref 9–46)
AST: 24 U/L (ref 10–35)
Albumin: 3.2 g/dL — ABNORMAL LOW (ref 3.6–5.1)
Alkaline Phosphatase: 133 U/L — ABNORMAL HIGH (ref 40–115)
BUN: 22 mg/dL (ref 7–25)
CO2: 28 mmol/L (ref 20–31)
Calcium: 8.5 mg/dL — ABNORMAL LOW (ref 8.6–10.3)
Chloride: 106 mmol/L (ref 98–110)
Creat: 2.04 mg/dL — ABNORMAL HIGH (ref 0.70–1.33)
GFR, Est African American: 42 mL/min — ABNORMAL LOW (ref >=60)
GFR, Est Non African American: 36 mL/min — ABNORMAL LOW (ref >=60)
Glucose, Bld: 89 mg/dL (ref 65–99)
Potassium: 4.3 mmol/L (ref 3.5–5.3)
Sodium: 144 mmol/L (ref 135–146)
Total Bilirubin: 0.5 mg/dL (ref 0.2–1.2)
Total Protein: 6.3 g/dL (ref 6.1–8.1)

## 2016-01-22 MED ORDER — ASPIRIN EC 81 MG PO TBEC: 81.0000 mg | DELAYED_RELEASE_TABLET | Freq: Every day | ORAL | Status: DC

## 2016-01-22 MED ORDER — ISOSORB DINITRATE-HYDRALAZINE 20-37.5 MG PO TABS: 1.0000 | ORAL_TABLET | Freq: Three times a day (TID) | ORAL | Status: DC

## 2016-01-22 MED ORDER — AMLODIPINE BESYLATE 10 MG PO TABS: 10.0000 mg | ORAL_TABLET | Freq: Every day | ORAL | Status: DC

## 2016-01-22 MED ORDER — BECLOMETHASONE DIPROPIONATE 40 MCG/ACT IN AERS: 2.0000 | INHALATION_SPRAY | Freq: Two times a day (BID) | RESPIRATORY_TRACT | Status: DC | PRN

## 2016-01-22 MED ORDER — POTASSIUM CHLORIDE ER 10 MEQ PO TBCR: 10.0000 meq | EXTENDED_RELEASE_TABLET | Freq: Every day | ORAL | Status: DC

## 2016-01-22 MED ORDER — MONTELUKAST SODIUM 10 MG PO TABS: 10.0000 mg | ORAL_TABLET | Freq: Every day | ORAL | Status: DC

## 2016-01-22 MED ORDER — FAMOTIDINE 20 MG PO TABS: 20.0000 mg | ORAL_TABLET | Freq: Two times a day (BID) | ORAL | Status: DC

## 2016-01-22 MED ORDER — TRIAMCINOLONE ACETONIDE 55 MCG/ACT NA AERO: 2.0000 | INHALATION_SPRAY | Freq: Every day | NASAL | Status: DC

## 2016-01-22 MED ORDER — LORATADINE 10 MG PO TABS: 10.0000 mg | ORAL_TABLET | Freq: Every day | ORAL | Status: DC | PRN

## 2016-01-22 MED ORDER — FUROSEMIDE 40 MG PO TABS: 40.0000 mg | ORAL_TABLET | Freq: Every day | ORAL | Status: DC

## 2016-01-22 MED ORDER — ATORVASTATIN CALCIUM 40 MG PO TABS: 40.0000 mg | ORAL_TABLET | Freq: Every day | ORAL | Status: DC

## 2016-01-22 MED ORDER — METOPROLOL TARTRATE 25 MG PO TABS: 25.0000 mg | ORAL_TABLET | Freq: Two times a day (BID) | ORAL | Status: DC

## 2016-01-22 MED FILL — ATORVASTATIN 40 MG TABLET: 40 | 30 days supply | Qty: 30 | Fill #0

## 2016-01-22 MED FILL — METOPROLOL TARTRATE 25 MG T: 25 | 30 days supply | Qty: 60 | Fill #0

## 2016-01-22 MED FILL — FUROSEMIDE 40 MG TABLET: 40 | 30 days supply | Qty: 30 | Fill #0

## 2016-01-22 MED FILL — AMLODIPINE BESYLATE 10 MG T: 10 | 30 days supply | Qty: 30 | Fill #0

## 2016-01-22 NOTE — Assessment & Plan Note (Signed)
A: CKD. No evidence of fluid overload. Patient is not taking nephrotoxic medications.  P: CMP done today Removed naproxen from med list Cautioned patient to avoid all NSAIDs

## 2016-01-22 NOTE — Telephone Encounter (Signed)
Received referral to schedule colon. Last colon in 2009 by Dr. Velora Heckler. Report placed on Dr. Doyne Keel desk for review.

## 2016-01-22 NOTE — Progress Notes (Signed)
F/U HTN Taking medication as prescribed  No pain today  No tobacco user  No suicidal thoughts in the past two weeks

## 2016-01-22 NOTE — Patient Instructions (Addendum)
Victor Thompson was seen today for hypertension.  Diagnoses and all orders for this visit:  Cardiomyopathy, ischemic -     aspirin EC 81 MG tablet; Take 1 tablet (81 mg total) by mouth daily.  Chronic combined systolic and diastolic CHF (congestive heart failure) (HCC) -     COMPLETE METABOLIC PANEL WITH GFR -     isosorbide-hydrALAZINE (BIDIL) 20-37.5 MG tablet; Take 1 tablet by mouth 3 (three) times daily. -     furosemide (LASIX) 40 MG tablet; Take 1 tablet (40 mg total) by mouth daily. -     metoprolol tartrate (LOPRESSOR) 25 MG tablet; Take 1 tablet (25 mg total) by mouth 2 (two) times daily. -     potassium chloride (K-DUR) 10 MEQ tablet; Take 1 tablet (10 mEq total) by mouth daily.  Essential hypertension -     COMPLETE METABOLIC PANEL WITH GFR -     amLODipine (NORVASC) 10 MG tablet; Take 1 tablet (10 mg total) by mouth daily.  Healthcare maintenance -     Cancel: Ambulatory referral to Gastroenterology  Screening for HIV (human immunodeficiency virus) -     Cancel: HIV antibody (with reflex)  Need for hepatitis C screening test -     Cancel: Hepatitis C antibody, reflex  Erectile dysfunction, unspecified erectile dysfunction type -     Ambulatory referral to Urology  Asthma, mild intermittent, uncomplicated -     montelukast (SINGULAIR) 10 MG tablet; Take 1 tablet (10 mg total) by mouth at bedtime. -     beclomethasone (QVAR) 40 MCG/ACT inhaler; Inhale 2 puffs into the lungs 2 (two) times daily as needed (shortness of breath/ wheezing).  Hyperlipidemia -     atorvastatin (LIPITOR) 40 MG tablet; Take 1 tablet (40 mg total) by mouth at bedtime.  Atherosclerosis of native coronary artery of native heart without angina pectoris -     atorvastatin (LIPITOR) 40 MG tablet; Take 1 tablet (40 mg total) by mouth at bedtime.  Allergic rhinitis, unspecified allergic rhinitis type -     loratadine (CLARITIN) 10 MG tablet; Take 1 tablet (10 mg total) by mouth daily as needed for  allergies. -     triamcinolone (NASACORT AQ) 55 MCG/ACT AERO nasal inhaler; Place 2 sprays into the nose daily.  Gastroesophageal reflux disease, esophagitis presence not specified -     famotidine (PEPCID) 20 MG tablet; Take 1 tablet (20 mg total) by mouth 2 (two) times daily.  CKD (chronic kidney disease) stage 3, GFR 30-59 ml/min -     COMPLETE METABOLIC PANEL WITH GFR  Tobacco use disorder in remission   All meds refilled Stop naproxen, avoid NSAIDs in setting of chronic kidney disease  You will be called with update regarding whether not viagra can be used  For erectile dysfunction, urology referral placed   F/u in 3 months for HTN  Dr. Adrian Blackwater

## 2016-01-22 NOTE — Assessment & Plan Note (Signed)
A; well controlled on current regimen Med: compliant P: Continue current regimen

## 2016-01-22 NOTE — Assessment & Plan Note (Addendum)
A; erectile dysfuntion, on bidil P: Avoid PDE inhibitors as long as patient on bidil Urology referral placed I will inquire to patient's cardiologist if there is a possibility that he will no longer require bidil in the future.

## 2016-01-22 NOTE — Progress Notes (Signed)
Subjective:  Patient ID: Victor Thompson, male    DOB: 02/26/1961  Age: 55 y.o. MRN: [id number]  CC: Hypertension   HPI Cedrick Aubry presents for   1. CHRONIC HYPERTENSION   Disease Monitoring  Blood pressure range: unsure   Chest pain: no   Dyspnea: no   Claudication: no   Medication compliance: yes, to most meds, reports a new med was added recently that he is not taking.   Medication Side Effects  Lightheadedness: no   Urinary frequency: no   Edema: no   Impotence: yes   Preventitive Healthcare:  Exercise: yes   Diet Pattern:  Salt Restriction: yes    2. Congestive heart failure: cardiologist is Dr. Doylene Canard. He last saw him yesterday.  Last ECHO 11/2015. His EF was 45-50% which was an improvement compared to 2011 ECHO. He denies CP. He sleeps on two pillow. No edema.   3. Healthcare maintenance: reports last colonoscopy was in 2009 and negative.  Reports that he was screened for HIV and Hep C in 2015 and both were negative. He declines repeat screen.   4. Erectile dysfunction: for past 3 months. He is having difficulty maintaining an erection.  He is with the same partner. No increased stressors. Reports trying Viagra in 2009.   5. CKD: long standing. He is working on weight loss. No swelling in legs. Sleeps on two pillows chronically. Naproxen on med list but he reports not taking it.    Social History  Substance Use Topics  . Smoking status: Former Smoker    Quit date: 12/06/2008  . Smokeless tobacco: Never Used  . Alcohol Use: No    Outpatient Prescriptions Prior to Visit  Medication Sig Dispense Refill  . albuterol (PROVENTIL HFA;VENTOLIN HFA) 108 (90 Base) MCG/ACT inhaler Inhale 2 puffs into the lungs every 4 (four) hours as needed for wheezing or shortness of breath. 1 Inhaler 0  . amLODipine (NORVASC) 10 MG tablet Take 1 tablet by mouth daily.  2  . aspirin EC 81 MG tablet Take 81 mg by mouth daily.    Marland Kitchen atorvastatin (LIPITOR) 40 MG tablet Take 40 mg by  mouth at bedtime.     . beclomethasone (QVAR) 40 MCG/ACT inhaler Inhale 2 puffs into the lungs 2 (two) times daily as needed (shortness of breath/ wheezing).     . carvedilol (COREG) 12.5 MG tablet Take 1 tablet (12.5 mg total) by mouth 2 (two) times daily with a meal. 60 tablet 3  . EPINEPHrine 0.3 mg/0.3 mL IJ SOAJ injection Inject 0.3 mLs (0.3 mg total) into the muscle once. (Patient taking differently: Inject 0.3 mg into the muscle once as needed (severe allergic reactions). ) 1 Device prn  . famotidine (PEPCID) 20 MG tablet Take 1 tablet (20 mg total) by mouth daily. 30 tablet 11  . furosemide (LASIX) 40 MG tablet Take 40 mg by mouth at bedtime.   0  . isosorbide-hydrALAZINE (BIDIL) 20-37.5 MG tablet Take 1 tablet by mouth 3 (three) times daily.     Marland Kitchen loratadine (CLARITIN) 10 MG tablet Take 10 mg by mouth daily as needed for allergies.    . metoprolol tartrate (LOPRESSOR) 25 MG tablet Take 25 mg by mouth 2 (two) times daily.    . montelukast (SINGULAIR) 10 MG tablet Take 1 tablet (10 mg total) by mouth at bedtime. 30 tablet 11  . naproxen sodium (ALEVE) 220 MG tablet Take 220 mg by mouth 2 (two) times daily as needed (pain).    Marland Kitchen  potassium chloride (K-DUR) 10 MEQ tablet Take 1 tablet by mouth daily.  2  . PRESCRIPTION MEDICATION Inhale into the lungs at bedtime. CPAP    . Triamcinolone Acetonide (NASACORT AQ NA) Place 1 spray into both nostrils daily as needed (congestion).     No facility-administered medications prior to visit.    ROS Review of Systems  Constitutional: Negative for fever, chills, fatigue and unexpected weight change.  Eyes: Negative for visual disturbance.  Respiratory: Negative for cough and shortness of breath.   Cardiovascular: Negative for chest pain, palpitations and leg swelling.  Gastrointestinal: Negative for nausea, vomiting, abdominal pain, diarrhea, constipation and blood in stool.  Endocrine: Negative for polydipsia, polyphagia and polyuria.    Musculoskeletal: Negative for myalgias, back pain, arthralgias, gait problem and neck pain.  Skin: Negative for rash.  Allergic/Immunologic: Negative for immunocompromised state.  Hematological: Negative for adenopathy. Does not bruise/bleed easily.  Psychiatric/Behavioral: Negative for suicidal ideas, sleep disturbance and dysphoric mood. The patient is not nervous/anxious.     Objective:  BP 127/84 mmHg  Pulse 62  Temp(Src) 97.9 F (36.6 C) (Oral)  Resp 16  Ht 5\' 7"  (1.702 m)  Wt 286 lb (129.729 kg)  BMI 44.78 kg/m2  SpO2 96%  BP/Weight 01/22/2016 01/05/2016 [id number]  Systolic BP [id number]  Diastolic BP 84 88 59  Wt. (Lbs) 286 288.4 -  BMI 44.78 45.16 -   Physical Exam  Constitutional: He appears well-developed and well-nourished. No distress.  HENT:  Head: Normocephalic and atraumatic.  Neck: Normal range of motion. Neck supple.  Cardiovascular: Normal rate, regular rhythm, normal heart sounds and intact distal pulses.   Pulmonary/Chest: Effort normal and breath sounds normal.  Musculoskeletal: He exhibits no edema.  Neurological: He is alert.  Skin: Skin is warm and dry. No rash noted. No erythema.  Psychiatric: He has a normal mood and affect.    Assessment & Plan:   Tynan was seen today for hypertension.  Diagnoses and all orders for this visit:  Cardiomyopathy, ischemic -     aspirin EC 81 MG tablet; Take 1 tablet (81 mg total) by mouth daily.  Chronic combined systolic and diastolic CHF (congestive heart failure) (HCC) -     COMPLETE METABOLIC PANEL WITH GFR -     isosorbide-hydrALAZINE (BIDIL) 20-37.5 MG tablet; Take 1 tablet by mouth 3 (three) times daily. -     furosemide (LASIX) 40 MG tablet; Take 1 tablet (40 mg total) by mouth daily. -     metoprolol tartrate (LOPRESSOR) 25 MG tablet; Take 1 tablet (25 mg total) by mouth 2 (two) times daily. -     potassium chloride (K-DUR) 10 MEQ tablet; Take 1 tablet (10 mEq total) by mouth daily.  Essential  hypertension -     COMPLETE METABOLIC PANEL WITH GFR -     amLODipine (NORVASC) 10 MG tablet; Take 1 tablet (10 mg total) by mouth daily.  Healthcare maintenance -     Cancel: Ambulatory referral to Gastroenterology  Screening for HIV (human immunodeficiency virus) -     Cancel: HIV antibody (with reflex)  Need for hepatitis C screening test -     Cancel: Hepatitis C antibody, reflex  Erectile dysfunction, unspecified erectile dysfunction type -     Ambulatory referral to Urology  Asthma, mild intermittent, uncomplicated -     montelukast (SINGULAIR) 10 MG tablet; Take 1 tablet (10 mg total) by mouth at bedtime. -     beclomethasone (QVAR) 40 MCG/ACT inhaler; Inhale  2 puffs into the lungs 2 (two) times daily as needed (shortness of breath/ wheezing).  Hyperlipidemia -     atorvastatin (LIPITOR) 40 MG tablet; Take 1 tablet (40 mg total) by mouth at bedtime.  Atherosclerosis of native coronary artery of native heart without angina pectoris -     atorvastatin (LIPITOR) 40 MG tablet; Take 1 tablet (40 mg total) by mouth at bedtime.  Allergic rhinitis, unspecified allergic rhinitis type -     loratadine (CLARITIN) 10 MG tablet; Take 1 tablet (10 mg total) by mouth daily as needed for allergies. -     triamcinolone (NASACORT AQ) 55 MCG/ACT AERO nasal inhaler; Place 2 sprays into the nose daily.  Gastroesophageal reflux disease, esophagitis presence not specified -     famotidine (PEPCID) 20 MG tablet; Take 1 tablet (20 mg total) by mouth 2 (two) times daily.  CKD (chronic kidney disease) stage 3, GFR 30-59 ml/min -     COMPLETE METABOLIC PANEL WITH GFR   There are no diagnoses linked to this encounter.  No orders of the defined types were placed in this encounter.    Follow-up: No Follow-up on file.   Boykin Nearing MD

## 2016-02-03 ENCOUNTER — Telehealth: Payer: Self-pay

## 2016-02-03 DIAGNOSIS — M25512 Pain in left shoulder: Principal | ICD-10-CM

## 2016-02-03 DIAGNOSIS — G8929 Other chronic pain: Secondary | ICD-10-CM

## 2016-02-03 NOTE — Telephone Encounter (Signed)
CMA called patient, patient verified name and DOB. Patient was given lab results, verbalized he understood, but wanted to know if he can't take NSAIDs what can he take? Per Dr. Adrian Blackwater he can take Tylenol for pain and HA's. Patient informed me that he was advised to not use Tylenol either due to problems in the past.  I assured patient that I would reach out to Dr. Adrian Blackwater to find out an alternative med for his Pain/HA's. Patient will be contacted with alternative method.

## 2016-02-03 NOTE — Telephone Encounter (Signed)
-----   Message from Boykin Nearing, MD sent at 01/23/2016  8:52 AM EST ----- Cr stable at 2, this is kidney disease but it has been stable for past 5 months  Continue current care plan Do not take advil, aleve, ibuprofen, motrin, etc to prevent progressing of kidney disease

## 2016-02-04 NOTE — Telephone Encounter (Signed)
Please call patient,  I see no reason in patient chart why he cannot take tylenol, his liver function has always been normal.   Does he know why he was informed to not take tylenol?   Another, non-tylenol and non-NSAID option is tramadol  I will prescribe tramadol  It will be available for pick up on 02/05/2016

## 2016-02-05 ENCOUNTER — Telehealth: Payer: Self-pay

## 2016-02-05 DIAGNOSIS — M25512 Pain in left shoulder: Principal | ICD-10-CM

## 2016-02-05 DIAGNOSIS — G8929 Other chronic pain: Secondary | ICD-10-CM | POA: Insufficient documentation

## 2016-02-05 MED ORDER — TRAMADOL HCL 50 MG PO TABS: 50.0000 mg | ORAL_TABLET | Freq: Three times a day (TID) | ORAL | Status: DC | PRN

## 2016-02-05 MED FILL — POTASSIUM CL 10 MEQ TAB SA: 10 | 30 days supply | Qty: 30 | Fill #0

## 2016-02-05 MED FILL — traMADol HCL 50 MG TABS: 50 | 20 days supply | Qty: 60 | Fill #0

## 2016-02-05 MED FILL — ?MONTELUKAST SOD 10 MG TAB: 10 | 30 days supply | Qty: 30 | Fill #0

## 2016-02-05 MED FILL — BIDIL TABLET: 20-37.5 | 30 days supply | Qty: 90 | Fill #0

## 2016-02-05 MED FILL — ?FAMOTIDINE 20 MG TABLET: 20 | 30 days supply | Qty: 60 | Fill #0

## 2016-02-05 MED FILL — !QVAR 40 MCG ORAL INHALER: 40 MCG | 30 days supply | Qty: 1 | Fill #0

## 2016-02-05 NOTE — Addendum Note (Signed)
Addended by: Boykin Nearing on: 02/05/2016 03:10 PM   Modules accepted: Orders

## 2016-02-05 NOTE — Telephone Encounter (Signed)
CMA called patient, patient verified name and DOB. Patient states he couldn't take Tylenol due to his blood pressure. Patient was infromed that he can try the Tramadol for pain and if pain persist, he can be reevaluated on next OV. Patient verbalized he understood and agreed to pickup meds here [facility].   Note from Dr. Adrian Blackwater:  Please call patient,  I see no reason in patient chart why he cannot take tylenol, his liver function has always been normal.   Does he know why he was informed to not take tylenol?   Another, non-tylenol and non-NSAID option is tramadol  I will prescribe tramadol  It will be available for pick up on 02/05/2016

## 2016-02-05 NOTE — Telephone Encounter (Signed)
Patient arrived today to pick up tramadol Tramadol prescribed

## 2016-02-05 NOTE — Telephone Encounter (Signed)
Dr. Havery Moros reviewed records and has accepted patient. Dr. Havery Moros states next colon in 2019 unless patient is having symptoms are family hx of colon cancer. Patient states that he is not having any problems and does not have a family hx of colon cancer. Recall colon entered.

## 2016-02-06 ENCOUNTER — Other Ambulatory Visit: Payer: Self-pay | Admitting: Family Medicine

## 2016-02-06 ENCOUNTER — Encounter: Payer: Self-pay | Admitting: Gastroenterology

## 2016-02-06 DIAGNOSIS — T783XXS Angioneurotic edema, sequela: Secondary | ICD-10-CM

## 2016-02-06 MED ORDER — ALBUTEROL SULFATE HFA 108 (90 BASE) MCG/ACT IN AERS: 2.0000 | INHALATION_SPRAY | RESPIRATORY_TRACT | Status: DC | PRN

## 2016-02-06 MED ORDER — EPINEPHRINE 0.3 MG/0.3ML IJ SOAJ: 0.3000 mg | Freq: Once | INTRAMUSCULAR | Status: DC

## 2016-02-09 ENCOUNTER — Other Ambulatory Visit: Payer: Self-pay | Admitting: *Deleted

## 2016-02-09 DIAGNOSIS — J4520 Mild intermittent asthma, uncomplicated: Secondary | ICD-10-CM

## 2016-02-09 DIAGNOSIS — I5042 Chronic combined systolic (congestive) and diastolic (congestive) heart failure: Secondary | ICD-10-CM

## 2016-02-09 MED ORDER — BECLOMETHASONE DIPROPIONATE 40 MCG/ACT IN AERS: 2.0000 | INHALATION_SPRAY | Freq: Two times a day (BID) | RESPIRATORY_TRACT | Status: DC | PRN

## 2016-02-09 MED ORDER — ISOSORB DINITRATE-HYDRALAZINE 20-37.5 MG PO TABS
1.0000 | ORAL_TABLET | Freq: Three times a day (TID) | ORAL | Status: DC
Start: 2016-02-09 — End: 2016-03-29

## 2016-03-04 ENCOUNTER — Inpatient Hospital Stay (HOSPITAL_COMMUNITY)
Admission: EM | Admit: 2016-03-04 | Discharge: 2016-03-06 | DRG: 309 | Disposition: A | Discharge status: Home / Self Care | Payer: Self-pay | Attending: Cardiology | Admitting: Cardiology

## 2016-03-04 ENCOUNTER — Encounter (HOSPITAL_COMMUNITY): Payer: Self-pay | Admitting: Emergency Medicine

## 2016-03-04 DIAGNOSIS — Z6841 Body Mass Index (BMI) 40.0 and over, adult: Secondary | ICD-10-CM

## 2016-03-04 DIAGNOSIS — Z79899 Other long term (current) drug therapy: Secondary | ICD-10-CM

## 2016-03-04 DIAGNOSIS — N184 Chronic kidney disease, stage 4 (severe): Secondary | ICD-10-CM | POA: Diagnosis present

## 2016-03-04 DIAGNOSIS — I462 Cardiac arrest due to underlying cardiac condition: Secondary | ICD-10-CM | POA: Diagnosis present

## 2016-03-04 DIAGNOSIS — I5022 Chronic systolic (congestive) heart failure: Secondary | ICD-10-CM | POA: Diagnosis present

## 2016-03-04 DIAGNOSIS — E785 Hyperlipidemia, unspecified: Secondary | ICD-10-CM | POA: Diagnosis present

## 2016-03-04 DIAGNOSIS — I4901 Ventricular fibrillation: Principal | ICD-10-CM

## 2016-03-04 DIAGNOSIS — J441 Chronic obstructive pulmonary disease with (acute) exacerbation: Secondary | ICD-10-CM | POA: Diagnosis present

## 2016-03-04 DIAGNOSIS — Z880 Allergy status to penicillin: Secondary | ICD-10-CM

## 2016-03-04 DIAGNOSIS — Z8249 Family history of ischemic heart disease and other diseases of the circulatory system: Secondary | ICD-10-CM

## 2016-03-04 DIAGNOSIS — I2510 Atherosclerotic heart disease of native coronary artery without angina pectoris: Secondary | ICD-10-CM

## 2016-03-04 DIAGNOSIS — Z0389 Encounter for observation for other suspected diseases and conditions ruled out: Secondary | ICD-10-CM

## 2016-03-04 DIAGNOSIS — I255 Ischemic cardiomyopathy: Secondary | ICD-10-CM

## 2016-03-04 DIAGNOSIS — Z955 Presence of coronary angioplasty implant and graft: Secondary | ICD-10-CM

## 2016-03-04 DIAGNOSIS — J45909 Unspecified asthma, uncomplicated: Secondary | ICD-10-CM | POA: Diagnosis present

## 2016-03-04 DIAGNOSIS — Z87891 Personal history of nicotine dependence: Secondary | ICD-10-CM

## 2016-03-04 DIAGNOSIS — I469 Cardiac arrest, cause unspecified: Secondary | ICD-10-CM | POA: Diagnosis present

## 2016-03-04 DIAGNOSIS — E876 Hypokalemia: Secondary | ICD-10-CM | POA: Diagnosis present

## 2016-03-04 DIAGNOSIS — I130 Hypertensive heart and chronic kidney disease with heart failure and stage 1 through stage 4 chronic kidney disease, or unspecified chronic kidney disease: Secondary | ICD-10-CM | POA: Diagnosis present

## 2016-03-04 DIAGNOSIS — Z95810 Presence of automatic (implantable) cardiac defibrillator: Secondary | ICD-10-CM

## 2016-03-04 DIAGNOSIS — G4733 Obstructive sleep apnea (adult) (pediatric): Secondary | ICD-10-CM | POA: Diagnosis present

## 2016-03-04 DIAGNOSIS — Z4502 Encounter for adjustment and management of automatic implantable cardiac defibrillator: Secondary | ICD-10-CM | POA: Insufficient documentation

## 2016-03-04 DIAGNOSIS — Z888 Allergy status to other drugs, medicaments and biological substances status: Secondary | ICD-10-CM

## 2016-03-04 DIAGNOSIS — Z7982 Long term (current) use of aspirin: Secondary | ICD-10-CM

## 2016-03-04 HISTORY — DX: Obesity, unspecified: E66.9

## 2016-03-04 LAB — COMPREHENSIVE METABOLIC PANEL
ALT: 23 U/L (ref 17–63)
AST: 30 U/L (ref 15–41)
Albumin: 2.9 g/dL — ABNORMAL LOW (ref 3.5–5.0)
Alkaline Phosphatase: 131 U/L — ABNORMAL HIGH (ref 38–126)
Anion gap: 8 (ref 5–15)
BUN: 19 mg/dL (ref 6–20)
CO2: 26 mmol/L (ref 22–32)
Calcium: 8.6 mg/dL — ABNORMAL LOW (ref 8.9–10.3)
Chloride: 108 mmol/L (ref 101–111)
Creatinine, Ser: 2.24 mg/dL — ABNORMAL HIGH (ref 0.61–1.24)
GFR calc Af Amer: 37 mL/min — ABNORMAL LOW (ref >60)
GFR calc non Af Amer: 32 mL/min — ABNORMAL LOW (ref >60)
Glucose, Bld: 97 mg/dL (ref 65–99)
Potassium: 3.3 mmol/L — ABNORMAL LOW (ref 3.5–5.1)
Sodium: 142 mmol/L (ref 135–145)
Total Bilirubin: 0.7 mg/dL (ref 0.3–1.2)
Total Protein: 7.4 g/dL (ref 6.5–8.1)

## 2016-03-04 LAB — URINALYSIS, ROUTINE W REFLEX MICROSCOPIC
Bilirubin Urine: NEGATIVE
Glucose, UA: NEGATIVE mg/dL
Ketones, ur: NEGATIVE mg/dL
Leukocytes, UA: NEGATIVE
Nitrite: NEGATIVE
Protein, ur: >300 mg/dL — AB
Specific Gravity, Urine: 1.016 (ref 1.005–1.030)
pH: 7.5 (ref 5.0–8.0)

## 2016-03-04 LAB — CBC
HCT: 40.6 % (ref 39.0–52.0)
HCT: 41.7 % (ref 39.0–52.0)
Hemoglobin: 13.3 g/dL (ref 13.0–17.0)
Hemoglobin: 14 g/dL (ref 13.0–17.0)
MCH: 29 pg (ref 26.0–34.0)
MCH: 29.7 pg (ref 26.0–34.0)
MCHC: 32.8 g/dL (ref 30.0–36.0)
MCHC: 33.6 g/dL (ref 30.0–36.0)
MCV: 88.6 fL (ref 78.0–100.0)
MCV: 88.5 fL (ref 78.0–100.0)
Platelets: 216 10*3/uL (ref 150–400)
Platelets: 232 10*3/uL (ref 150–400)
RBC: 4.58 MIL/uL (ref 4.22–5.81)
RBC: 4.71 MIL/uL (ref 4.22–5.81)
RDW: 15.5 % (ref 11.5–15.5)
RDW: 15.3 % (ref 11.5–15.5)
WBC: 4.4 10*3/uL (ref 4.0–10.5)
WBC: 6.7 10*3/uL (ref 4.0–10.5)

## 2016-03-04 LAB — BASIC METABOLIC PANEL
Anion gap: 11 (ref 5–15)
BUN: 21 mg/dL — ABNORMAL HIGH (ref 6–20)
CO2: 23 mmol/L (ref 22–32)
Calcium: 8.2 mg/dL — ABNORMAL LOW (ref 8.9–10.3)
Chloride: 108 mmol/L (ref 101–111)
Creatinine, Ser: 2.33 mg/dL — ABNORMAL HIGH (ref 0.61–1.24)
GFR calc Af Amer: 35 mL/min — ABNORMAL LOW (ref >60)
GFR calc non Af Amer: 30 mL/min — ABNORMAL LOW (ref >60)
Glucose, Bld: 119 mg/dL — ABNORMAL HIGH (ref 65–99)
Potassium: 2.9 mmol/L — ABNORMAL LOW (ref 3.5–5.1)
Sodium: 142 mmol/L (ref 135–145)

## 2016-03-04 LAB — MAGNESIUM: Magnesium: 2.1 mg/dL (ref 1.7–2.4)

## 2016-03-04 LAB — PROTIME-INR
INR: 1.08 (ref 0.00–1.49)
Prothrombin Time: 14.2 seconds (ref 11.6–15.2)

## 2016-03-04 LAB — URINE MICROSCOPIC-ADD ON: Squamous Epithelial / LPF: NONE SEEN

## 2016-03-04 LAB — CBG MONITORING, ED: Glucose-Capillary: 93 mg/dL (ref 65–99)

## 2016-03-04 LAB — TROPONIN I: Troponin I: 0.05 ng/mL — ABNORMAL HIGH (ref <0.031)

## 2016-03-04 LAB — TSH: TSH: 0.725 u[IU]/mL (ref 0.350–4.500)

## 2016-03-04 MED ORDER — POTASSIUM CHLORIDE CRYS ER 20 MEQ PO TBCR
40.0000 meq | EXTENDED_RELEASE_TABLET | Freq: Once | ORAL | Status: AC
  Administered 2016-03-04: 40 meq via ORAL
  Filled 2016-03-04: qty 2

## 2016-03-04 MED ORDER — ASPIRIN 81 MG PO CHEW
324.0000 mg | CHEWABLE_TABLET | ORAL | Status: AC
  Administered 2016-03-04: 324 mg via ORAL
  Filled 2016-03-04: qty 4

## 2016-03-04 MED ORDER — SPIRONOLACTONE 25 MG PO TABS
25.0000 mg | ORAL_TABLET | Freq: Every day | ORAL | Status: DC
  Administered 2016-03-05 – 2016-03-06 (×2): 25 mg via ORAL
  Filled 2016-03-04 (×3): qty 1

## 2016-03-04 MED ORDER — ISOSORB DINITRATE-HYDRALAZINE 20-37.5 MG PO TABS
1.0000 | ORAL_TABLET | Freq: Three times a day (TID) | ORAL | Status: DC
  Administered 2016-03-04 – 2016-03-06 (×7): 1 via ORAL
  Filled 2016-03-04 (×7): qty 1

## 2016-03-04 MED ORDER — NITROGLYCERIN 0.4 MG SL SUBL: 0.4000 mg | SUBLINGUAL_TABLET | SUBLINGUAL | Status: DC | PRN

## 2016-03-04 MED ORDER — ACETAMINOPHEN 325 MG PO TABS: 650.0000 mg | ORAL_TABLET | ORAL | Status: DC | PRN

## 2016-03-04 MED ORDER — HEPARIN SODIUM (PORCINE) 5000 UNIT/ML IJ SOLN
5000.0000 [IU] | Freq: Three times a day (TID) | INTRAMUSCULAR | Status: DC
  Filled 2016-03-04 (×2): qty 1

## 2016-03-04 MED ORDER — ASPIRIN EC 81 MG PO TBEC
81.0000 mg | DELAYED_RELEASE_TABLET | Freq: Every day | ORAL | Status: DC
  Administered 2016-03-05 – 2016-03-06 (×2): 81 mg via ORAL
  Filled 2016-03-04 (×2): qty 1

## 2016-03-04 MED ORDER — ATORVASTATIN CALCIUM 40 MG PO TABS
40.0000 mg | ORAL_TABLET | Freq: Every day | ORAL | Status: DC
  Administered 2016-03-04 – 2016-03-05 (×2): 40 mg via ORAL
  Filled 2016-03-04 (×2): qty 1

## 2016-03-04 MED ORDER — CARVEDILOL 12.5 MG PO TABS
12.5000 mg | ORAL_TABLET | Freq: Two times a day (BID) | ORAL | Status: DC
  Filled 2016-03-04: qty 1

## 2016-03-04 MED ORDER — ASPIRIN 300 MG RE SUPP: 300.0000 mg | RECTAL | Status: AC

## 2016-03-04 MED ORDER — ONDANSETRON HCL 4 MG/2ML IJ SOLN: 4.0000 mg | Freq: Four times a day (QID) | INTRAMUSCULAR | Status: DC | PRN

## 2016-03-04 NOTE — ED Notes (Signed)
Pt made aware of bed assignment 

## 2016-03-04 NOTE — ED Notes (Signed)
CBG 93 

## 2016-03-04 NOTE — ED Notes (Signed)
Heart healthy lunch tray & hospital bed ordered.

## 2016-03-04 NOTE — H&P (Signed)
Victor Thompson is an 55 y.o. male.   Chief Complaint: Near-syncope/ICD shocks HPI: Patient is 55 year old male with past rectal history significant for coronary artery disease history of cardiac arrest in the past status post ICD and [facility] in 2015, hypertension, hyperlipidemia, chronic kidney disease stage IV remote tobacco abuse, history of congestive heart failure secondary to systolic dysfunction, obstructive sleep apnea, morbid obesity, came to the ER early this morning following near-syncopal episode while driving to his work. Patient states he felt shock and had near-syncopal episode while driving. His friend pulled over who was sitting beside him. No history of motor vehicle accident. Patient states he had similar episode of shock last night but did not seek any medical attention. Denies any ischemic workup in recent past. States had stent in 2007 in [place]. Patient was noted to have hypokalemia which is being replaced.  Past Medical History  Diagnosis Date  . Coronary artery disease   . Hypertension   . Cardiac arrest ([place])   . Pulmonary edema   . CHF (congestive heart failure) ([place])   . Shortness of breath dyspnea   . AICD (automatic cardioverter/defibrillator) present   . Sleep apnea     USES CPAP  . Obesity     Past Surgical History  Procedure Laterality Date  . Implantable cardioverter defibrillator implant    . Coronary stent placement      Family History  Problem Relation Age of Onset  . Hypertension Mother    Social History:  reports that he quit smoking about 7 years ago. He has never used smokeless tobacco. He reports that he does not drink alcohol or use illicit drugs.  Allergies:  Allergies  Allergen Reactions  . Ace Inhibitors Swelling  . Lidocaine Hcl Swelling     tongue swelling  . Lisinopril Swelling    REACTION: tongue swelling.Pt reported problem with a BP med which sounded like  lisinopril  But as of 09/19/06,pt had tolerated altace without  problem  . Penicillins Swelling    Tongue swelling - cannot take any "cillins" Has patient had a PCN reaction causing immediate rash, facial/tongue/throat swelling, SOB or lightheadedness with hypotension: Yes Has patient had a PCN reaction causing severe rash involving mucus membranes or skin necrosis: No Has patient had a PCN reaction that required hospitalization No Has patient had a PCN reaction occurring within the last 10 years: Yes If all of the above answers are "NO", then may proceed with Cephalosporin use.     (Not in a hospital admission)  Results for orders placed or performed during the hospital encounter of 03/04/16 (from the past 48 hour(s))  CBG monitoring, ED     Status: None   Collection Time: 03/04/16  6:56 AM  Result Value Ref Range   Glucose-Capillary 93 65 - 99 mg/dL  Basic metabolic panel     Status: Abnormal   Collection Time: 03/04/16  7:25 AM  Result Value Ref Range   Sodium 142 135 - 145 mmol/L   Potassium 2.9 (L) 3.5 - 5.1 mmol/L   Chloride 108 101 - 111 mmol/L   CO2 23 22 - 32 mmol/L   Glucose, Bld 119 (H) 65 - 99 mg/dL   BUN 21 (H) 6 - 20 mg/dL   Creatinine, Ser 2.33 (H) 0.61 - 1.24 mg/dL   Calcium 8.2 (L) 8.9 - 10.3 mg/dL   GFR calc non Af Amer 30 (L) >60 mL/min   GFR calc Af Amer 35 (L) >60 mL/min  Comment: (NOTE) The eGFR has been calculated using the CKD EPI equation. This calculation has not been validated in all clinical situations. eGFR's persistently <60 mL/min signify possible Chronic Kidney Disease.    Anion gap 11 5 - 15  CBC     Status: None   Collection Time: 03/04/16  7:25 AM  Result Value Ref Range   WBC 4.4 4.0 - 10.5 K/uL   RBC 4.58 4.22 - 5.81 MIL/uL   Hemoglobin 13.3 13.0 - 17.0 g/dL   HCT 40.6 39.0 - 52.0 %   MCV 88.6 78.0 - 100.0 fL   MCH 29.0 26.0 - 34.0 pg   MCHC 32.8 30.0 - 36.0 g/dL   RDW 15.5 11.5 - 15.5 %   Platelets 216 150 - 400 K/uL  Urinalysis, Routine w reflex microscopic (not at [facility])     Status:  Abnormal   Collection Time: 03/04/16 10:19 AM  Result Value Ref Range   Color, Urine YELLOW YELLOW   APPearance CLEAR CLEAR   Specific Gravity, Urine 1.016 1.005 - 1.030   pH 7.5 5.0 - 8.0   Glucose, UA NEGATIVE NEGATIVE mg/dL   Hgb urine dipstick TRACE (A) NEGATIVE   Bilirubin Urine NEGATIVE NEGATIVE   Ketones, ur NEGATIVE NEGATIVE mg/dL   Protein, ur >300 (A) NEGATIVE mg/dL   Nitrite NEGATIVE NEGATIVE   Leukocytes, UA NEGATIVE NEGATIVE  Urine microscopic-add on     Status: Abnormal   Collection Time: 03/04/16 10:19 AM  Result Value Ref Range   Squamous Epithelial / LPF NONE SEEN NONE SEEN   WBC, UA 0-5 0 - 5 WBC/hpf   RBC / HPF 0-5 0 - 5 RBC/hpf   Bacteria, UA RARE (A) NONE SEEN   Casts HYALINE CASTS (A) NEGATIVE   No results found.  Review of Systems  Eyes: Negative for double vision.  Respiratory: Negative for shortness of breath.   Cardiovascular: Negative for chest pain, palpitations and leg swelling.  Gastrointestinal: Negative for nausea and vomiting.  Neurological: Positive for dizziness. Negative for headaches.    Blood pressure 154/84, pulse 83, temperature 98 F (36.7 C), temperature source Oral, resp. rate 25, height _0  (1.702 m), weight 130.182 kg (287 lb), SpO2 98 %. Physical Exam  Constitutional: He is oriented to person, place, and time.  HENT:  Head: Normocephalic and atraumatic.  Eyes: Conjunctivae are normal. Pupils are equal, round, and reactive to light. Left eye exhibits no discharge. No scleral icterus.  Neck: Normal range of motion. Neck supple. No JVD present. No tracheal deviation present. No thyromegaly present.  Cardiovascular: Normal rate and regular rhythm.   Murmur (Soft systolic murmur noted no S3 gallop) heard. Respiratory: Effort normal and breath sounds normal.  GI: Soft. Bowel sounds are normal.  Musculoskeletal: He exhibits no edema or tenderness.  Neurological: He is alert and oriented to person, place, and time.      Assessment/Plan Status post recurrent V. fib cardiac arrest in the setting of hypokalemia status post ICD shocks Coronary artery disease history of PCI in the past History of cardiac arrest in the past status post ICD Ischemic cardiomyopathy History of congestive heart failure secondary to depressed LV systolic function in the past Hypertension Hyperlipidemia Obstructive sleep apnea Remote tobacco abuse Chronic kidney disease stage IV Plan As per orders   Charolette Forward, MD 03/04/2016, 3:45 PM   You start her onI1 aortic Arch and this Morning and consult  Inhaler   He is closed and consult was a PA
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
## 2016-03-04 NOTE — ED Notes (Addendum)
Pt. reports brief syncopal episode with dizziness and nausea this morning , alert and oriented at arrival / ambulatory , denies chest pain or SOB . Speech clear/ no facial asymmetry , equal grips with no arm drift.

## 2016-03-04 NOTE — ED Notes (Addendum)
This RN spoke with Corene Cornea with [employer].  He will be over to interrogate SCID in approx 1 hour.  Dr. Eulis Foster made aware.

## 2016-03-04 NOTE — ED Notes (Signed)
[employer] for interrogation

## 2016-03-04 NOTE — ED Notes (Addendum)
Victor Thompson from [place] at [employer], reports pt appears to have episode of VF this am resulting in syncopal event which triggered his SCICD to fire.  Pt appears to have returned to NSR.  Victor Thompson reports pt has had 4 episodes over past 4 weeks.  Pt provided with report.  Copy of report placed in med record.  Dr. Eulis Foster made aware.

## 2016-03-05 ENCOUNTER — Inpatient Hospital Stay (HOSPITAL_COMMUNITY): Payer: MEDICAID

## 2016-03-05 LAB — BASIC METABOLIC PANEL
Anion gap: 7 (ref 5–15)
BUN: 19 mg/dL (ref 6–20)
CO2: 25 mmol/L (ref 22–32)
Calcium: 8.2 mg/dL — ABNORMAL LOW (ref 8.9–10.3)
Chloride: 110 mmol/L (ref 101–111)
Creatinine, Ser: 2.08 mg/dL — ABNORMAL HIGH (ref 0.61–1.24)
GFR calc Af Amer: 40 mL/min — ABNORMAL LOW (ref >60)
GFR calc non Af Amer: 35 mL/min — ABNORMAL LOW (ref >60)
Glucose, Bld: 105 mg/dL — ABNORMAL HIGH (ref 65–99)
Potassium: 3.3 mmol/L — ABNORMAL LOW (ref 3.5–5.1)
Sodium: 142 mmol/L (ref 135–145)

## 2016-03-05 LAB — LIPID PANEL
Cholesterol: 182 mg/dL (ref 0–200)
HDL: 38 mg/dL — ABNORMAL LOW (ref >40)
LDL Cholesterol: 120 mg/dL — ABNORMAL HIGH (ref 0–99)
Total CHOL/HDL Ratio: 4.8 RATIO
Triglycerides: 118 mg/dL (ref <150)
VLDL: 24 mg/dL (ref 0–40)

## 2016-03-05 LAB — CBC
HCT: 38.7 % — ABNORMAL LOW (ref 39.0–52.0)
Hemoglobin: 12.7 g/dL — ABNORMAL LOW (ref 13.0–17.0)
MCH: 29.1 pg (ref 26.0–34.0)
MCHC: 32.8 g/dL (ref 30.0–36.0)
MCV: 88.8 fL (ref 78.0–100.0)
Platelets: 215 10*3/uL (ref 150–400)
RBC: 4.36 MIL/uL (ref 4.22–5.81)
RDW: 15.7 % — ABNORMAL HIGH (ref 11.5–15.5)
WBC: 5.5 10*3/uL (ref 4.0–10.5)

## 2016-03-05 LAB — TROPONIN I
Troponin I: 0.05 ng/mL — ABNORMAL HIGH (ref <0.031)
Troponin I: 0.06 ng/mL — ABNORMAL HIGH (ref <0.031)

## 2016-03-05 MED ORDER — POTASSIUM CHLORIDE CRYS ER 20 MEQ PO TBCR
40.0000 meq | EXTENDED_RELEASE_TABLET | Freq: Once | ORAL | Status: AC
  Administered 2016-03-05: 40 meq via ORAL
  Filled 2016-03-05: qty 2

## 2016-03-05 MED ORDER — TRAMADOL HCL 50 MG PO TABS
50.0000 mg | ORAL_TABLET | Freq: Three times a day (TID) | ORAL | Status: DC | PRN
  Administered 2016-03-05 – 2016-03-06 (×2): 50 mg via ORAL
  Filled 2016-03-05 (×2): qty 1

## 2016-03-05 MED ORDER — POTASSIUM CHLORIDE CRYS ER 20 MEQ PO TBCR
20.0000 meq | EXTENDED_RELEASE_TABLET | Freq: Two times a day (BID) | ORAL | Status: DC
  Administered 2016-03-05 – 2016-03-06 (×2): 20 meq via ORAL
  Filled 2016-03-05 (×2): qty 1

## 2016-03-05 MED ORDER — CARVEDILOL 25 MG PO TABS
25.0000 mg | ORAL_TABLET | Freq: Two times a day (BID) | ORAL | Status: DC
  Administered 2016-03-05 – 2016-03-06 (×4): 25 mg via ORAL
  Filled 2016-03-05 (×4): qty 1

## 2016-03-05 NOTE — Care Management Note (Signed)
Case Management Note  Patient Details  Name: Destine Bronaugh MRN: [id number] Date of Birth: 02-Jul-1961  Subjective/Objective: Pt admitted for syncope- ICD fired.                  Action/Plan: Pt is without insurance. Hospital f/u scheduled for [facility] made via [place]. Placed appointment time onAVS. Pt will be able to utilize pharmacy on site. No further needs from CM at this time.    Expected Discharge Date:                  Expected Discharge Plan:  Home/Self Care  In-House Referral:  Financial Counselor  Discharge planning Services  CM Consult, Follow-up appt scheduled, [place] Acute Care Choice:  NA Choice offered to:  NA  DME Arranged:  N/A DME Agency:  NA  HH Arranged:  NA HH Agency:  NA  Status of Service:  Completed, signed off  Medicare Important Message Given:    Date Medicare IM Given:    Medicare IM give by:    Date Additional Medicare IM Given:    Additional Medicare Important Message give by:     If discussed at [place] of Stay Meetings, dates discussed:    Additional Comments:  Bethena Roys, RN 03/05/2016, 3:31 PM

## 2016-03-05 NOTE — Progress Notes (Signed)
Subjective:  Patient denies any chest pain or shortness of breath.  Denies any palpitation.  Had few beats of nonsustained VT, asymptomatic.  No ICD shocks.  potassium is being replaced. Tolerated.  Rest portion of the nuclear stress test earlier today  Objective:  Vital Signs in the last 24 hours: Temp:  [97.7 F (36.5 C)-98.3 F (36.8 C)] 98.2 F (36.8 C) (03/31 0359) Pulse Rate:  [82-85] 83 (03/31 0852) Resp:  [18-23] 18 (03/31 0359) BP: (122-161)/(55-86) 149/80 mmHg (03/31 0852) SpO2:  [93 %-100 %] 93 % (03/31 0852) Weight:  [129.502 kg (285 lb 8 oz)-129.729 kg (286 lb)] 129.729 kg (286 lb) (03/31 0510)  Intake/Output from previous day: 03/30 0701 - 03/31 0700 In: -  Out: 700 [Urine:700] Intake/Output from this shift: Total I/O In: 120 [P.O.:120] Out: -   Physical Exam: Neck: no adenopathy, no carotid bruit, no JVD and supple, symmetrical, trachea midline Lungs: clear to auscultation bilaterally Heart: regular rate and rhythm, S1, S2 normal and soft systolic murmur noted Abdomen: soft, non-tender; bowel sounds normal; no masses,  no organomegaly Extremities: extremities normal, atraumatic, no cyanosis or edema  Lab Results:  Recent Labs  03/04/16 1653 03/05/16 0503  WBC 6.7 5.5  HGB 14.0 12.7*  PLT 232 215    Recent Labs  03/04/16 1653 03/05/16 0503  NA 142 142  K 3.3* 3.3*  CL 108 110  CO2 26 25  GLUCOSE 97 105*  BUN 19 19  CREATININE 2.24* 2.08*    Recent Labs  03/04/16 2334 03/05/16 0503  TROPONINI 0.05* 0.06*   Hepatic Function Panel  Recent Labs  03/04/16 1653  PROT 7.4  ALBUMIN 2.9*  AST 30  ALT 23  ALKPHOS 131*  BILITOT 0.7    Recent Labs  03/05/16 0503  CHOL 182   No results for input(s): PROTIME in the last 72 hours.  Imaging: Imaging results have been reviewed and No results found.  Cardiac Studies:  Assessment/Plan:  Status post recurrent V. fib cardiac arrest in the setting of hypokalemia status post ICD  shocks Coronary artery disease history of PCI in the past History of cardiac arrest in the past status post ICD Ischemic cardiomyopathy History of congestive heart failure secondary to depressed LV systolic function in the past Hypertension Hyperlipidemia Obstructive sleep apnea Remote tobacco abuse Chronic kidney disease stage IV Plan Increase carvedilol as per orders. Replace K as per orders. Check labs in a.m. Possible discharge tomorrow if nuclear stress test negative for ischemia  LOS: 1 day    Charolette Forward 03/05/2016, 3:26 PM

## 2016-03-05 NOTE — Hospital Discharge Follow-Up (Signed)
Transitional Care Clinic at [place]:  Victor Thompson known to the [facility] at [place]. Victor Thompson has now established care with Dr. Adrian Blackwater at [facility]. This Case Manager met with Victor Thompson to determine if Victor Thompson would like to receive follow-up and medical management with the [facility] after discharge. Victor Thompson indicated he preferred to continue follow-up with Dr. Adrian Blackwater. Appointment scheduled on 03/15/16 at 86 with Dr. Adrian Blackwater at [facility]. AVS updated. Victor Thompson indicated he will have a ride to his appointment.  Victor Thompson also indicated he is working on gathering documents to finish [employer]. Encouraged Victor Thompson to schedule an appointment with Financial Counselor at [facility] or utilize Development worker, community walk-in hours when all documentation obtained. Victor Thompson verbalized understanding and appreciative of information. Jacqlyn Krauss, RN CM updated.

## 2016-03-05 NOTE — Progress Notes (Signed)
SUBJECTIVE: The patient is doing well today.  At this time, he denies chest pain, shortness of breath, or any new concerns.  Wants to go home.  Marland Kitchen aspirin EC  81 mg Oral Daily  . atorvastatin  40 mg Oral QHS  . carvedilol  25 mg Oral BID WC  . heparin  5,000 Units Subcutaneous 3 times per day  . isosorbide-hydrALAZINE  1 tablet Oral TID  . spironolactone  25 mg Oral Daily      OBJECTIVE: Physical Exam: Filed Vitals:   03/04/16 2016 03/04/16 2346 03/05/16 0359 03/05/16 0510  BP: 140/60 122/55 141/81   Pulse: 82 83 84   Temp: 98 F (36.7 C) 98.3 F (36.8 C) 98.2 F (36.8 C)   TempSrc: Oral Oral Oral   Resp: 18 18 18    Height:      Weight:    286 lb (129.729 kg)  SpO2: 96% 96% 97%     Intake/Output Summary (Last 24 hours) at 03/05/16 [id number] Last data filed at 03/04/16 1600  Gross per 24 hour  Intake      0 ml  Output    700 ml  Net   -700 ml    Telemetry reveals sinus rhythm, NSVT noted overnight  GEN- The patient is obese appearing, alert and oriented x 3 today.   Head- normocephalic, atraumatic Eyes-  Sclera clear, conjunctiva pink Ears- hearing intact Oropharynx- clear Neck- supple,   Lungs- Clear to ausculation bilaterally, normal work of breathing Heart- Regular rate and rhythm, no murmurs, rubs or gallops, PMI not laterally displaced GI- soft, NT, ND, + BS Extremities- no clubbing, cyanosis, + dependant edema Skin- no rash or lesion Psych- euthymic mood, full affect Neuro- strength and sensation are intact  LABS: Basic Metabolic Panel:  Recent Labs  03/04/16 0725 03/04/16 1653  NA 142 142  K 2.9* 3.3*  CL 108 108  CO2 23 26  GLUCOSE 119* 97  BUN 21* 19  CREATININE 2.33* 2.24*  CALCIUM 8.2* 8.6*  MG  --  2.1   Liver Function Tests:  Recent Labs  03/04/16 1653  AST 30  ALT 23  ALKPHOS 131*  BILITOT 0.7  PROT 7.4  ALBUMIN 2.9*   No results for input(s): LIPASE, AMYLASE in the last 72 hours. CBC:  Recent Labs  03/04/16 1653  03/05/16 0503  WBC 6.7 5.5  HGB 14.0 12.7*  HCT 41.7 38.7*  MCV 88.5 88.8  PLT 232 215   Cardiac Enzymes:  Recent Labs  03/04/16 1653 03/04/16 2334  TROPONINI 0.05* 0.05*   ASSESSMENT AND PLAN:  Active Problems:   COPD exacerbation (HCC)   Cardiac arrest with ventricular fibrillation [facility])   Defibrillator discharge   Ventricular fibrillation (HCC)    1. VF x2 with successful shocks, in the setting of significant hypokalemia   Replete K     Keep K >3.9, Mg >1.9 (will need home K supplementation now that he is on lasix)  Increase coreg to 25mg  BID     Interestingly, recently coreg was switched to toprol before he began having VF.  I am optimistic that switching back to coreg may improve his arrhythmia.  If he has further sustained arrhythmias in the future, would consider amiodarone at that point.     OK to discharge from EP standpoint.  I will arrange follow-up with me in the office.  No driving 6 months by [place] law, the patient was informed, states understanding  2. CAD  No CP,  no ischemic looking EKG changes  Troponin flat even despite ICD shock. I do not feel strongly that he have further inpatient ischemic workup at this time but will defer to primary cardiologist.   3. ICM  Appears compensated by his exam  4. CKD  Electrophysiology team to see as needed while here. Please call with questions.   [name], MD 03/05/2016 6:20 AM

## 2016-03-05 NOTE — Progress Notes (Signed)
Called by tele, pt had 8 beats VT. Pt asymptomatic. MD made aware. No new orders at this time. Will continue to monitor closely.

## 2016-03-05 NOTE — Progress Notes (Signed)
Patient had 4 beats V-tach at 02:12 AM.  Patient is asymptomatic. Will continue to monitor.

## 2016-03-06 LAB — BASIC METABOLIC PANEL
Anion gap: 6 (ref 5–15)
BUN: 22 mg/dL — ABNORMAL HIGH (ref 6–20)
CO2: 24 mmol/L (ref 22–32)
Calcium: 8.3 mg/dL — ABNORMAL LOW (ref 8.9–10.3)
Chloride: 110 mmol/L (ref 101–111)
Creatinine, Ser: 2.07 mg/dL — ABNORMAL HIGH (ref 0.61–1.24)
GFR calc Af Amer: 41 mL/min — ABNORMAL LOW (ref >60)
GFR calc non Af Amer: 35 mL/min — ABNORMAL LOW (ref >60)
Glucose, Bld: 97 mg/dL (ref 65–99)
Potassium: 3.6 mmol/L (ref 3.5–5.1)
Sodium: 140 mmol/L (ref 135–145)

## 2016-03-06 LAB — CBC
HCT: 38.7 % — ABNORMAL LOW (ref 39.0–52.0)
Hemoglobin: 12.6 g/dL — ABNORMAL LOW (ref 13.0–17.0)
MCH: 29 pg (ref 26.0–34.0)
MCHC: 32.6 g/dL (ref 30.0–36.0)
MCV: 89 fL (ref 78.0–100.0)
Platelets: 206 10*3/uL (ref 150–400)
RBC: 4.35 MIL/uL (ref 4.22–5.81)
RDW: 15.7 % — ABNORMAL HIGH (ref 11.5–15.5)
WBC: 5.5 10*3/uL (ref 4.0–10.5)

## 2016-03-06 MED ORDER — NITROGLYCERIN 0.4 MG SL SUBL: 0.4000 mg | SUBLINGUAL_TABLET | SUBLINGUAL | Status: DC | PRN

## 2016-03-06 MED ORDER — TECHNETIUM TC 99M SESTAMIBI - CARDIOLITE
30.0000 | Freq: Once | INTRAVENOUS | Status: AC | PRN
  Administered 2016-03-05: 30 via INTRAVENOUS

## 2016-03-06 MED ORDER — LABETALOL HCL 5 MG/ML IV SOLN
INTRAVENOUS | Status: AC
  Administered 2016-03-06: 10 mg via INTRAVENOUS
  Filled 2016-03-06: qty 4

## 2016-03-06 MED ORDER — TECHNETIUM TC 99M SESTAMIBI GENERIC - CARDIOLITE
30.0000 | Freq: Once | INTRAVENOUS | Status: AC | PRN
  Administered 2016-03-06: 30 via INTRAVENOUS

## 2016-03-06 MED ORDER — LABETALOL HCL 5 MG/ML IV SOLN
10.0000 mg | Freq: Once | INTRAVENOUS | Status: AC
  Administered 2016-03-06: 10 mg via INTRAVENOUS

## 2016-03-06 MED ORDER — REGADENOSON 0.4 MG/5ML IV SOLN
0.4000 mg | Freq: Once | INTRAVENOUS | Status: AC
  Administered 2016-03-06: 0.4 mg via INTRAVENOUS

## 2016-03-06 MED ORDER — SPIRONOLACTONE 25 MG PO TABS: 25.0000 mg | ORAL_TABLET | Freq: Every day | ORAL | Status: DC

## 2016-03-06 MED ORDER — CARVEDILOL 25 MG PO TABS: 25.0000 mg | ORAL_TABLET | Freq: Two times a day (BID) | ORAL | Status: DC

## 2016-03-06 MED ORDER — AMLODIPINE BESYLATE 5 MG PO TABS
5.0000 mg | ORAL_TABLET | Freq: Every day | ORAL | Status: DC
  Administered 2016-03-06: 5 mg via ORAL
  Filled 2016-03-06: qty 1

## 2016-03-06 MED ORDER — REGADENOSON 0.4 MG/5ML IV SOLN
INTRAVENOUS | Status: AC
  Filled 2016-03-06: qty 5

## 2016-03-06 NOTE — Discharge Summary (Signed)
Discharge summary dictated on 03/06/2016 dictation number is [phone]

## 2016-03-06 NOTE — Discharge Instructions (Signed)
No driving for 6 monthsCardioverter Defibrillator Implantation An implantable cardioverter defibrillator (ICD) is a small, lightweight, battery-powered device that is placed (implanted) under the skin in the chest or abdomen. Your caregiver may prescribe an ICD if:  You have had an irregular heart rhythm (arrhythmia) that originated in the lower chambers of the heart (ventricles).  Your heart has been damaged by a disease (such as coronary artery disease) or heart condition (such as a heart attack). An ICD consists of a battery that lasts several years, a small computer called a pulse generator, and wires called leads that go into the heart. It is used to detect and correct two dangerous arrhythmias: a rapid heart rhythm (tachycardia) and an arrhythmia in which the ventricles contract in an uncoordinated way (fibrillation). When an ICD detects tachycardia, it sends an electrical signal to the heart that restores the heartbeat to normal (cardioversion). This signal is usually painless. If cardioversion does not work or if the ICD detects fibrillation, it delivers a small electrical shock to the heart (defibrillation) to restart the heart. The shock may feel like a strong jolt in the chest.ICDs may be programmed to correct other problems. Sometimes, ICDs are programmed to act as another type of implantable device called a pacemaker. Pacemakers are used to treat a slow heartbeat (bradycardia). LET YOUR CAREGIVER KNOW ABOUT:  Any allergies you have.  All medicines you are taking, including vitamins, herbs, eyedrops, and over-the-counter medicines and creams.  Previous problems you or members of your family have had with the use of anesthetics.  Any blood disorders you have had.  Other health problems you have. RISKS AND COMPLICATIONS Generally, the procedure to implant an ICD is safe. However, as with any surgical procedure, complications can occur. Possible complications associated with implanting  an ICD include:  Swelling, bleeding, or bruising at the site where the ICD was implanted.  Infection at the site where the ICD was implanted.  A reaction to medicine used during the procedure.  Nerve, heart, or blood vessel damage.  Blood clots. BEFORE THE PROCEDURE  You may need to have blood tests, heart tests, or a chest X-ray done before the day of the procedure.  Ask your caregiver about changing or stopping your regular medicines.  Make plans to have someone drive you home. You may need to stay in the hospital overnight after the procedure.  Stop smoking at least 24 hours before the procedure.  Take a bath or shower the night before the procedure. You may need to scrub your chest or abdomen with a special type of soap.  Do not eat or drink before your procedure for as long as directed by your caregiver. Ask if it is okay to take any needed medicine with a small sip of water. PROCEDURE  The procedure to implant an ICD in your chest or abdomen is usually done at a hospital in a room that has a large X-ray machine called a fluoroscope. The machine will be above you during the procedure. It will help your caregiver see your heart during the procedure. Implanting an ICD usually takes 1-3 hours. Before the procedure:   Small monitors will be put on your body. They will be used to check your heart, blood pressure, and oxygen level.  A needle will be put into a vein in your hand or arm. This is called an intravenous (IV) access tube. Fluids and medicine will flow directly into your body through the IV tube.  Your chest or abdomen will  be cleaned with a germ-killing (antiseptic) solution. The area may be shaved.  You may be given medicine to help you relax (sedative).  You will be given a medicine called a local anesthetic. This medicine will make the surgical site numb while the ICD is implanted. You will be sleepy but awake during the procedure. After you are numb the procedure  will begin. The caregiver will:  Make a small cut (incision). This will make a pocket deep under your skin that will hold the pulse generator.  Guide the leads through a large blood vessel into your heart and attach them to the heart muscles. Depending on the ICD, the leads may go into one ventricle or they may go to both ventricles and into an upper chamber of the heart (atrium).  Test the ICD.  Close the incision with stitches, glue, or staples. AFTER THE PROCEDURE  You may feel pain. Some pain is normal. It may last a few days.  You may stay in a recovery area until the local anesthetic has worn off. Your blood pressure and pulse will be checked often. You will be taken to a room where your heart will be monitored.  A chest X-ray will be taken. This is done to check that the cardioverter defibrillator is in the right place.  You may stay in the hospital overnight.  A slight bump may be seen over the skin where the ICD was placed. Sometimes, it is possible to feel the ICD under the skin. This is normal.  In the months and years afterward, your caregiver will check the device, the leads, and the battery every few months. Eventually, when the battery is low, the ICD will be replaced.   This information is not intended to replace advice given to you by your health care provider. Make sure you discuss any questions you have with your health care provider.   Document Released: 08/14/2002 Document Revised: 09/12/2013 Document Reviewed: 12/11/2012 Elsevier Interactive Patient Education [employer].

## 2016-03-06 NOTE — Plan of Care (Signed)
Problem: Education: Goal: Knowledge of [facility] Education information/materials will improve Outcome: Completed/Met Date Met:  03/06/16 Pt educated throughout hospitalization regarding medications, tests, procedures, pain ratings, and vaccine education.   Problem: Safety: Goal: Ability to remain free from injury will improve Outcome: Completed/Met Date Met:  03/06/16 Pt remains free from injury during this admission   Problem: Pain Managment: Goal: General experience of comfort will improve Outcome: Completed/Met Date Met:  03/06/16 Pt remains free from pain at this time  Problem: Physical Regulation: Goal: Will remain free from infection Outcome: Completed/Met Date Met:  03/06/16 Pt remains free from infection during this admission

## 2016-03-06 NOTE — Discharge Summary (Signed)
Victor Thompson, Victor Thompson                ACCOUNT NO.:  [id number]  MEDICAL RECORD NO.:  [id number]  LOCATION:  3W14C                        FACILITY:  [facility]  PHYSICIAN:  Allegra Lai. Terrence Dupont, M.D. DATE OF BIRTH:  [birth year]  DATE OF ADMISSION:  03/04/2016 DATE OF DISCHARGE:  03/06/2016                              DISCHARGE SUMMARY   ADMITTING DIAGNOSES: 1. Status post recurrent ventricular fibrillation cardiac arrest in     the setting of hypokalemia status post ICD in the past. 2. Coronary artery disease, history of percutaneous coronary     intervention in the past. 3. History of cardiac arrest in the past status post ICD. 4. Ischemic cardiomyopathy. 5. History of congestive heart failure secondary to depressed left     ventricular systolic function in the past. 6. Hypertension. 7. Hyperlipidemia. 8. Obstructive sleep apnea. 9. Remote tobacco abuse. 10.Chronic kidney disease, stage 4.  DISCHARGE DIAGNOSES: 1. Status post recurrent ventricular fibrillation cardiac arrest in     the setting of hypokalemia status post ICD shocks. 2. Status post hypokalemia. 3. Coronary artery disease, history of percutaneous coronary     intervention in the past.  Negative nuclear stress test, although     it showed markedly depressed left ventricular systolic function     with inferolateral and posterolateral wall scarring with ejection     fraction of 28%. 4. History of cardiac arrest in the past, status post ICD. 5. Ischemic cardiomyopathy. 6. History of congestive heart failure secondary to depressed left     ventricular systolic function. 7. Hypertension. 8. Hyperlipidemia. 9. Obstructive sleep apnea. 10.Remote tobacco abuse. 11.Chronic kidney disease, stage 4.  DIET:  Low salt, low cholesterol, weight-reducing diet.  DISCHARGE INSTRUCTIONS:  The patient has been advised to monitor weight daily.  Heart failure instructions have been given.  The patient has been advised to avoid driving for  6 months.  CONDITION AT DISCHARGE:  Stable.  FOLLOWUP:  Follow up with Dr. Doylene Canard in 2 weeks.  Follow up with Dr. Rayann Heman as scheduled.  DISCHARGE HOME MEDICATIONS: 1. Carvedilol 25 mg 1 tablet twice daily. 2. Nitrostat 0.4 mg sublingual p.r.n. 3. Spironolactone 25 mg 1 tablet daily. 4. Albuterol inhaler as before as needed. 5. Aspirin 81 mg 1 tablet daily. 6. Atorvastatin 40 mg daily. 7. EpiPen as before. 8. Pepcid 20 mg daily. 9. Claritin 10 mg daily. 10.CPAP as before. 11.QVAR 2 puffs twice daily as before. 12.BiDil 1 tablet 3 times daily. 13.Singulair 10 mg 1 tablet daily. 14.Tramadol 50 mg 1 tablet every 8 hours as needed for pain. 15.Nasacort as before. 16.The patient has been advised to stop amlodipine, Lasix, metoprolol     tartrate, and potassium chloride.  BRIEF HISTORY AND HOSPITAL COURSE:  Victor Thompson is a 55 year old male with past medical history significant for coronary artery disease, history of cardiac arrest in the past status post ICD at [facility] in 2015, hypertension, hyperlipidemia, chronic kidney disease stage 4, remote tobacco abuse, history of congestive heart failure secondary to systolic dysfunction, obstructive sleep apnea, morbid obesity.  He came to the ER early this morning following near syncopal episode while driving to his work.  The patient states he  felt shock and had near syncopal episode while driving.  His friend pulled over who was sitting beside him.  No history of motor vehicle accident.  The patient states he had similar episode of shock last night but did not seek any medical attention.  Denies any ischemic workup in recent past.  States he had stent placed in [place] in 2007.  The patient was noted to be hypokalemic which is being replaced.  PHYSICAL EXAMINATION:  GENERAL:  When seen in the ED, he was alert, awake, and oriented x3. VITAL SIGNS:  Blood pressure 154/84, pulse 83, he was afebrile.  HEENT: Conjunctivae were  pink. NECK:  Supple.  No JVD.  No bruit. LUNGS:  Clear to auscultation without rhonchi or rales. CARDIOVASCULAR: S1, S2 was normal.  There was soft systolic murmur.  No S3, gallop. ABDOMEN:  Soft.  Bowel sounds were present.  Nontender. EXTREMITIES:  There was no clubbing, cyanosis, or edema.  LABORATORY DATA:  His sodium was 142, potassium 2.9, BUN 21, creatinine 2.33.  Troponin I 3 sets were 0.05, 0.05, and 0.06.  Cholesterol was 182, triglycerides 118, HDL 38, LDL was elevated at 120.  Hemoglobin was 13.3, hematocrit 40.6, white count of 4.4.  Repeat electrolytes today sodium is 140, potassium 3.6, BUN 22, creatinine 2.07.  The patient had nuclear stress test which showed a large zone of nonreversible decreased myocardial perfusion involving the inferolateral and posterolateral wall of the ventricle extending to the inferior apex, diffuse myocardial thinning with left ventricular dilatation with EF of 28% with no evidence of reversible ischemia.  BRIEF HOSPITAL COURSE:  The patient was admitted to step-down unit.  The patient ruled out for myocardial infarction.  The patient did not have any episodes of chest pain during the hospital stay.  His troponin was minimally elevated which was attributed to ICD shocks.  His potassium was replaced.  EP consultation was obtained with Dr. Rayann Heman.  His metoprolol was switched to carvedilol, and his Lasix was switched to spironolactone which he is tolerating it well.  His renal function has been stable.  Discussed at length with high-risk findings on the nuclear stress test due to markedly depressed LV function and left ventricular dilatation, although there was no evidence of reversible ischemia.  The patient denies any chest pain.  States, he was told in the past had enlarged heart.  Records from [facility] are not available, but the patient states he had ICD because his heart was enlarged and weak.  The patient did have 2D echo  in December 2016 which showed EF approximately 45%.  The patient is eager to go home, will be discharged home on above medications.  He will be followed up closely as outpatient and may need repeat 2D echo and old records from [place].  The patient has been advised to follow up closely with Dr. Doylene Canard and EP, Dr. Rayann Heman as scheduled.  The patient also has been advised to refrain from using any NSAIDs.     Allegra Lai. Terrence Dupont, M.D.     MNH/MEDQ  D:  03/06/2016  T:  03/06/2016  Job:  [id number]  cc:   Dr. Merrilee Jansky office

## 2016-03-06 NOTE — Progress Notes (Signed)
Subjective:  Doing well denies any chest pain or shortness of breath. No further ICD discharges tolerated nuclear stress portion today awaiting nuclear scan results  Objective:  Vital Signs in the last 24 hours: Temp:  [98 F (36.7 C)-99.1 F (37.3 C)] 98.8 F (37.1 C) (04/01 0803) Pulse Rate:  [79-88] 85 (04/01 0913) Resp:  [18-20] 18 (04/01 0854) BP: (92-167)/(67-113) 147/98 mmHg (04/01 0913) SpO2:  [94 %-100 %] 100 % (04/01 0803) Weight:  [130.001 kg (286 lb 9.6 oz)] 130.001 kg (286 lb 9.6 oz) (04/01 0540)  Intake/Output from previous day: 03/31 0701 - 04/01 0700 In: 580 [P.O.:580] Out: -  Intake/Output from this shift:    Physical Exam: Exam unchanged  Lab Results:  Recent Labs  03/05/16 0503 03/06/16 0432  WBC 5.5 5.5  HGB 12.7* 12.6*  PLT 215 206    Recent Labs  03/05/16 0503 03/06/16 0432  NA 142 140  K 3.3* 3.6  CL 110 110  CO2 25 24  GLUCOSE 105* 97  BUN 19 22*  CREATININE 2.08* 2.07*    Recent Labs  03/04/16 2334 03/05/16 0503  TROPONINI 0.05* 0.06*   Hepatic Function Panel  Recent Labs  03/04/16 1653  PROT 7.4  ALBUMIN 2.9*  AST 30  ALT 23  ALKPHOS 131*  BILITOT 0.7    Recent Labs  03/05/16 0503  CHOL 182   No results for input(s): PROTIME in the last 72 hours.  Imaging: Imaging results have been reviewed and No results found.  Cardiac Studies:  Assessment/Plan:  Status post recurrent V. fib cardiac arrest in the setting of hypokalemia status post ICD shocks Coronary artery disease history of PCI in the past History of cardiac arrest in the past status post ICD Ischemic cardiomyopathy History of congestive heart failure secondary to depressed LV systolic function in the past Hypertension Hyperlipidemia Obstructive sleep apnea Remote tobacco abuse Chronic kidney disease stage IV Plan Restart amlodipine 5 mg daily Will DC home if nuclear stress test negative for ischemia later today  LOS: 2 days    Charolette Forward 03/06/2016, 10:06 AM

## 2016-03-08 MED FILL — ?SPIRONOLACTONE 25 MG TABLE: 25 | 30 days supply | Qty: 30 | Fill #0

## 2016-03-08 MED FILL — ?CARVEDILOL 25 MG TABLET: 25 | 30 days supply | Qty: 60 | Fill #0

## 2016-03-10 MED FILL — BIDIL TABLET: 20-37.5 | 30 days supply | Qty: 90 | Fill #1

## 2016-03-10 MED FILL — ?AMLODIPINE BESYLATE 10 MG: 10 | 30 days supply | Qty: 30 | Fill #1

## 2016-03-10 MED FILL — POTASSIUM CL 10 MEQ TAB SA: 10 | 30 days supply | Qty: 30 | Fill #1

## 2016-03-10 MED FILL — ATORVASTATIN 40 MG TABLET: 40 | 30 days supply | Qty: 30 | Fill #1

## 2016-03-15 ENCOUNTER — Encounter: Payer: Self-pay | Admitting: Family Medicine

## 2016-03-15 ENCOUNTER — Ambulatory Visit: Payer: Self-pay | Attending: Family Medicine | Admitting: Family Medicine

## 2016-03-15 ENCOUNTER — Encounter: Payer: Self-pay | Admitting: Clinical

## 2016-03-15 VITALS — BP 138/80 | HR 66 | Temp 98.3°F | Resp 16 | Ht 67.0 in | Wt 286.0 lb

## 2016-03-15 DIAGNOSIS — I5042 Chronic combined systolic (congestive) and diastolic (congestive) heart failure: Secondary | ICD-10-CM | POA: Insufficient documentation

## 2016-03-15 DIAGNOSIS — Z7982 Long term (current) use of aspirin: Secondary | ICD-10-CM | POA: Insufficient documentation

## 2016-03-15 DIAGNOSIS — Z87891 Personal history of nicotine dependence: Secondary | ICD-10-CM | POA: Insufficient documentation

## 2016-03-15 DIAGNOSIS — I4901 Ventricular fibrillation: Secondary | ICD-10-CM | POA: Insufficient documentation

## 2016-03-15 DIAGNOSIS — Z79899 Other long term (current) drug therapy: Secondary | ICD-10-CM | POA: Insufficient documentation

## 2016-03-15 DIAGNOSIS — Z23 Encounter for immunization: Secondary | ICD-10-CM

## 2016-03-15 LAB — BASIC METABOLIC PANEL
BUN: 21 mg/dL (ref 7–25)
CO2: 25 mmol/L (ref 20–31)
Calcium: 8.6 mg/dL (ref 8.6–10.3)
Chloride: 108 mmol/L (ref 98–110)
Creat: 1.97 mg/dL — ABNORMAL HIGH (ref 0.70–1.33)
Glucose, Bld: 99 mg/dL (ref 65–99)
Potassium: 4.5 mmol/L (ref 3.5–5.3)
Sodium: 140 mmol/L (ref 135–146)

## 2016-03-15 LAB — CBC
HCT: 39.2 % (ref 38.5–50.0)
Hemoglobin: 13.3 g/dL (ref 13.2–17.1)
MCH: 29.8 pg (ref 27.0–33.0)
MCHC: 33.9 g/dL (ref 32.0–36.0)
MCV: 87.9 fL (ref 80.0–100.0)
MPV: 12 fL (ref 7.5–12.5)
Platelets: 221 10*3/uL (ref 140–400)
RBC: 4.46 MIL/uL (ref 4.20–5.80)
RDW: 15.7 % — ABNORMAL HIGH (ref 11.0–15.0)
WBC: 5.3 10*3/uL (ref 3.8–10.8)

## 2016-03-15 NOTE — Patient Instructions (Addendum)
Victor Thompson was seen today for hospitalization follow-up.  Diagnoses and all orders for this visit:  Ventricular fibrillation [facility]) -     Basic Metabolic Panel -     CBC   Talk to your cardiologist about switching from spironolactone  to eplenerone given your concern about erectile dysfunction.   Tetanus shot done today  F/u in 3 months for HTN   Dr. Adrian Blackwater

## 2016-03-15 NOTE — Progress Notes (Signed)
Depression screen [facility] 2/9 03/15/2016 01/22/2016 12/22/2015  Decreased Interest 0 0 0  Down, Depressed, Hopeless 0 0 0  PHQ - 2 Score 0 0 0  Altered sleeping 1 - -  Tired, decreased energy 1 - -  Change in appetite 0 - -  Feeling bad or failure about yourself  0 - -  Trouble concentrating 0 - -  Moving slowly or fidgety/restless 0 - -  Suicidal thoughts 0 - -  PHQ-9 Score 2 - -    GAD 7 : Generalized Anxiety Score 03/15/2016 01/22/2016  Nervous, Anxious, on Edge 0 0  Control/stop worrying 2 0  Worry too much - different things 2 0  Trouble relaxing 0 0  Restless 0 0  Easily annoyed or irritable 0 1  Afraid - awful might happen 2 0  Total GAD 7 Score 6 1

## 2016-03-15 NOTE — Progress Notes (Signed)
Subjective:  Patient ID: Victor Thompson, male    DOB: Jun 01, 1961  Age: 55 y.o. MRN: [id number]  CC: Hospitalization Follow-up   HPI Victor Thompson presents for   1. HFU V fib cardiac arrest: patient was hospitalized from 3/30-03/06/16 for near-syncope/ICD shocks in setting of V fib due to hypokalemia. This was in the setting of lasix 40 mg daily and K DUR 10 mEq daily. He potassium was replaced during his hospitalization. He denies CP, SOB, near syncope, syncope. He is now taking spironolactone 25 mg daily. He is concerned about the risk of erectile dysfunction.   Social History  Substance Use Topics  . Smoking status: Former Smoker    Quit date: 12/06/2008  . Smokeless tobacco: Never Used  . Alcohol Use: No    Outpatient Prescriptions Prior to Visit  Medication Sig Dispense Refill  . albuterol (PROVENTIL HFA;VENTOLIN HFA) 108 (90 Base) MCG/ACT inhaler Inhale 2 puffs into the lungs every 4 (four) hours as needed for wheezing or shortness of breath. 54 g 3  . aspirin EC 81 MG tablet Take 1 tablet (81 mg total) by mouth daily. 30 tablet 5  . atorvastatin (LIPITOR) 40 MG tablet Take 1 tablet (40 mg total) by mouth at bedtime. 30 tablet 5  . beclomethasone (QVAR) 40 MCG/ACT inhaler Inhale 2 puffs into the lungs 2 (two) times daily as needed (shortness of breath/ wheezing). (Patient taking differently: Inhale 1 puff into the lungs 2 (two) times daily as needed (shortness of breath/ wheezing). ) 1 Inhaler 5  . carvedilol (COREG) 25 MG tablet Take 1 tablet (25 mg total) by mouth 2 (two) times daily with a meal. 60 tablet 3  . EPINEPHrine 0.3 mg/0.3 mL IJ SOAJ injection Inject 0.3 mLs (0.3 mg total) into the muscle once. 1 Device 3  . famotidine (PEPCID) 20 MG tablet Take 1 tablet (20 mg total) by mouth 2 (two) times daily. 60 tablet 5  . isosorbide-hydrALAZINE (BIDIL) 20-37.5 MG tablet Take 1 tablet by mouth 3 (three) times daily. (Patient taking differently: Take 1 tablet by mouth 2 (two) times  daily. ) 90 tablet 3  . loratadine (CLARITIN) 10 MG tablet Take 1 tablet (10 mg total) by mouth daily as needed for allergies. 30 tablet 5  . montelukast (SINGULAIR) 10 MG tablet Take 1 tablet (10 mg total) by mouth at bedtime. (Patient taking differently: Take 10 mg by mouth daily as needed. ) 30 tablet 11  . nitroGLYCERIN (NITROSTAT) 0.4 MG SL tablet Place 1 tablet (0.4 mg total) under the tongue every 5 (five) minutes x 3 doses as needed for chest pain. 25 tablet 12  . PRESCRIPTION MEDICATION Inhale into the lungs at bedtime. CPAP    . spironolactone (ALDACTONE) 25 MG tablet Take 1 tablet (25 mg total) by mouth daily. 30 tablet 3  . traMADol (ULTRAM) 50 MG tablet Take 1 tablet (50 mg total) by mouth every 8 (eight) hours as needed. (Patient taking differently: Take 50 mg by mouth daily as needed. ) 60 tablet 0  . triamcinolone (NASACORT AQ) 55 MCG/ACT AERO nasal inhaler Place 2 sprays into the nose daily. (Patient taking differently: Place 1 spray into the nose daily. ) 1 Inhaler 12   No facility-administered medications prior to visit.    ROS Review of Systems  Constitutional: Negative for fever, chills, fatigue and unexpected weight change.  Eyes: Negative for visual disturbance.  Respiratory: Negative for cough and shortness of breath.   Cardiovascular: Negative for chest pain, palpitations and  leg swelling.  Gastrointestinal: Negative for nausea, vomiting, abdominal pain, diarrhea, constipation and blood in stool.  Endocrine: Negative for polydipsia, polyphagia and polyuria.  Musculoskeletal: Negative for myalgias, back pain, arthralgias, gait problem and neck pain.  Skin: Negative for rash.  Allergic/Immunologic: Negative for immunocompromised state.  Hematological: Negative for adenopathy. Does not bruise/bleed easily.  Psychiatric/Behavioral: Negative for suicidal ideas, sleep disturbance and dysphoric mood. The patient is not nervous/anxious.     Objective:  BP 138/80 mmHg   Pulse 66  Temp(Src) 98.3 F (36.8 C) (Oral)  Resp 16  Ht 5\' 7"  (1.702 m)  Wt 286 lb (129.729 kg)  BMI 44.78 kg/m2  SpO2 98%  BP/Weight 03/15/2016 03/06/2016 [id number]  Systolic BP 0000000 [id number]  Diastolic BP 80 51 84  Wt. (Lbs) 286 286.6 286  BMI 44.78 44.88 44.78   Physical Exam  Constitutional: He appears well-developed and well-nourished. No distress.  HENT:  Head: Normocephalic and atraumatic.  Neck: Normal range of motion. Neck supple.  Cardiovascular: Normal rate, regular rhythm, normal heart sounds and intact distal pulses.   Pulmonary/Chest: Effort normal and breath sounds normal.  Musculoskeletal: He exhibits edema (trace to 1+ LLE ).  Neurological: He is alert.  Skin: Skin is warm and dry. No rash noted. No erythema.  Psychiatric: He has a normal mood and affect.     Assessment & Plan:   There are no diagnoses linked to this encounter. Victor Thompson was seen today for hospitalization follow-up.  Diagnoses and all orders for this visit:  Ventricular fibrillation ([place]) -     Basic Metabolic Panel -     CBC  Other orders -     Tdap vaccine greater than or equal to 7yo IM   No orders of the defined types were placed in this encounter.    Follow-up: No Follow-up on file.   Boykin Nearing MD

## 2016-03-15 NOTE — Progress Notes (Signed)
HFU defibrinator problems Pt stated was taken off potassium and lasix No Pain today  No suicidal thoughts in the past two weeks No tobacco user

## 2016-03-16 ENCOUNTER — Encounter: Payer: Self-pay | Admitting: Family Medicine

## 2016-03-16 NOTE — Assessment & Plan Note (Signed)
Taking aldactone BP and HR normal   Checking BMP and CBC today

## 2016-03-17 ENCOUNTER — Telehealth: Payer: Self-pay | Admitting: *Deleted

## 2016-03-17 NOTE — Telephone Encounter (Signed)
-----   Message from Boykin Nearing, MD sent at 03/15/2016 11:50 PM EDT ----- Normal potassium Improved Cr Normal CBC

## 2016-03-17 NOTE — Telephone Encounter (Signed)
LVM to return call.

## 2016-03-29 ENCOUNTER — Encounter: Payer: Self-pay | Admitting: Internal Medicine

## 2016-03-29 ENCOUNTER — Ambulatory Visit (INDEPENDENT_AMBULATORY_CARE_PROVIDER_SITE_OTHER): Payer: Self-pay | Admitting: Internal Medicine

## 2016-03-29 VITALS — BP 122/86 | HR 76 | Ht 67.0 in | Wt 284.8 lb

## 2016-03-29 DIAGNOSIS — I472 Ventricular tachycardia, unspecified: Secondary | ICD-10-CM | POA: Insufficient documentation

## 2016-03-29 DIAGNOSIS — I5042 Chronic combined systolic (congestive) and diastolic (congestive) heart failure: Secondary | ICD-10-CM

## 2016-03-29 DIAGNOSIS — I255 Ischemic cardiomyopathy: Secondary | ICD-10-CM

## 2016-03-29 NOTE — Progress Notes (Signed)
Electrophysiology Office Note   Date:  03/29/2016   ID:  Victor Thompson, DOB Mar 22, 1961, MRN [id number]  PCP:  Minerva Ends, MD  Cardiologist:  Dr Doylene Canard Primary Electrophysiologist: [name], MD    Chief Complaint  Patient presents with  . VT     History of Present Illness: Victor Thompson is a 55 y.o. male who presents today for electrophysiology evaluation.   The patient presents for follow-up after recent hospital visit for symptomatic VT with ICD shocks delivered.  He had recently been switched from coreg to toprol prior to the episodes.  We switched him back to coreg in the hospital.  He has done well since. He has returned to work but is not driving.  He says he is following our instructions.  He has had no further arrhythmias.    Today, he denies symptoms of palpitations, chest pain, shortness of breath, orthopnea, PND, lower extremity edema, claudication, dizziness, presyncope, syncope, bleeding, or neurologic sequela. The patient is tolerating medications without difficulties and is otherwise without complaint today.    Past Medical History  Diagnosis Date  . Coronary artery disease   . Hypertension   . Cardiac arrest ([place])   . Pulmonary edema   . CHF (congestive heart failure) ([place])   . Shortness of breath dyspnea   . AICD (automatic cardioverter/defibrillator) present   . Sleep apnea     USES CPAP  . Obesity    Past Surgical History  Procedure Laterality Date  . Implantable cardioverter defibrillator implant    . Coronary stent placement       Current Outpatient Prescriptions  Medication Sig Dispense Refill  . albuterol (PROVENTIL HFA;VENTOLIN HFA) 108 (90 Base) MCG/ACT inhaler Inhale 2 puffs into the lungs every 4 (four) hours as needed for wheezing or shortness of breath. 54 g 3  . amLODipine (NORVASC) 10 MG tablet Take 1 tablet by mouth as directed.  5  . aspirin EC 81 MG tablet Take 1 tablet (81 mg total) by mouth daily. 30 tablet 5  .  atorvastatin (LIPITOR) 40 MG tablet Take 1 tablet (40 mg total) by mouth at bedtime. 30 tablet 5  . beclomethasone (QVAR) 40 MCG/ACT inhaler Inhale 1 puff into the lungs 2 (two) times daily.    . carvedilol (COREG) 25 MG tablet Take 1 tablet (25 mg total) by mouth 2 (two) times daily with a meal. 60 tablet 3  . EPINEPHrine 0.3 mg/0.3 mL IJ SOAJ injection Inject 0.3 mLs (0.3 mg total) into the muscle once. 1 Device 3  . famotidine (PEPCID) 20 MG tablet Take 1 tablet (20 mg total) by mouth 2 (two) times daily. 60 tablet 5  . furosemide (LASIX) 40 MG tablet Take 1 tablet by mouth as directed.  5  . isosorbide-hydrALAZINE (BIDIL) 20-37.5 MG tablet Take 1 tablet by mouth 2 (two) times daily.    Marland Kitchen loratadine (CLARITIN) 10 MG tablet Take 1 tablet (10 mg total) by mouth daily as needed for allergies. 30 tablet 5  . montelukast (SINGULAIR) 10 MG tablet Take 10 mg by mouth as directed.    . nitroGLYCERIN (NITROSTAT) 0.4 MG SL tablet Place 1 tablet (0.4 mg total) under the tongue every 5 (five) minutes x 3 doses as needed for chest pain. 25 tablet 12  . potassium chloride (K-DUR) 10 MEQ tablet Take 1 tablet by mouth as directed.  2  . PRESCRIPTION MEDICATION Inhale into the lungs at bedtime. CPAP    . spironolactone (ALDACTONE) 25 MG  tablet Take 1 tablet (25 mg total) by mouth daily. 30 tablet 3  . traMADol (ULTRAM) 50 MG tablet Take 50 mg by mouth every 6 (six) hours as needed.    . triamcinolone (NASAL ALLERGY 24 HOUR) 55 MCG/ACT AERO nasal inhaler Place 1 spray into the nose as directed.     No current facility-administered medications for this visit.    Allergies:   Ace inhibitors; Lidocaine hcl; Lisinopril; and Penicillins   Social History:  The patient  reports that he quit smoking about 7 years ago. He has never used smokeless tobacco. He reports that he does not drink alcohol or use illicit drugs.   Family History:  The patient's family history includes Hypertension in his mother.    ROS:   Please see the history of present illness.   All other systems are reviewed and negative.    PHYSICAL EXAM: VS:  BP 122/86 mmHg  Pulse 76  Ht 5\' 7"  (1.702 m)  Wt 284 lb 12.8 oz (129.184 kg)  BMI 44.60 kg/m2 , BMI Body mass index is 44.6 kg/(m^2). GEN: obese, in no acute distress HEENT: normal Neck: no JVD, carotid bruits, or masses Cardiac: RRR;  + 1 edema  Respiratory:  clear to auscultation bilaterally, normal work of breathing GI: soft, nontender, nondistended, + BS MS: no deformity or atrophy Skin: warm and dry, device pocket is well healed Neuro:  Strength and sensation are intact Psych: euthymic mood, full affect  EKG:  EKG is ordered today. The ekg ordered today shows sinus rhythm with LAHB  Device interrogation is reviewed today in detail.  See PaceArt for details.   Recent Labs: 11/24/2015: B Natriuretic Peptide 686.3* 03/04/2016: ALT 23; Magnesium 2.1; TSH 0.725 03/15/2016: BUN 21; Creat 1.97*; Hemoglobin 13.3; Platelets 221; Potassium 4.5; Sodium 140    Lipid Panel     Component Value Date/Time   CHOL 182 03/05/2016 0503   TRIG 118 03/05/2016 0503   HDL 38* 03/05/2016 0503   CHOLHDL 4.8 03/05/2016 0503   VLDL 24 03/05/2016 0503   LDLCALC 120* 03/05/2016 0503     Wt Readings from Last 3 Encounters:  03/29/16 284 lb 12.8 oz (129.184 kg)  03/15/16 286 lb (129.729 kg)  03/06/16 286 lb 9.6 oz (130.001 kg)      Other studies Reviewed: Additional studies/ records that were reviewed today include: hospital records   ASSESSMENT AND PLAN:  1.  VT Doing well s/p hospital discharge SICD interrogated and functioning normally today Continue coreg No driving x 6 months  2. Chronic systolic dysfunction Stable No change required today  3. CAD No ischemic symptoms Stable No change required today  4. Obesity Weight reduction encouraged  Follow-up: return to see me for device management in 6 months  Current medicines are reviewed at length with the  patient today.   The patient does not have concerns regarding his medicines.  The following changes were made today:  none  Signed, [name], MD  03/29/2016 5:03 PM     [place] [id number] [phone] (office) [phone] (fax)

## 2016-03-29 NOTE — Patient Instructions (Signed)

## 2016-03-30 LAB — CUP PACEART INCLINIC DEVICE CHECK
Date Time Interrogation Session: 20170425083559
Implantable Lead Implant Date: 20150507
Implantable Lead Location: 753862
Implantable Lead Model: 3400
Pulse Gen Model: 1010
Pulse Gen Serial Number: 14970

## 2016-04-19 MED FILL — AMLODIPINE BESYLATE 10 MG T: 10 | 30 days supply | Qty: 30 | Fill #2

## 2016-05-04 ENCOUNTER — Encounter: Payer: Self-pay | Admitting: Internal Medicine

## 2016-05-04 ENCOUNTER — Ambulatory Visit: Payer: Self-pay | Admitting: Internal Medicine

## 2016-05-04 VITALS — BP 122/60 | HR 72 | Ht 67.0 in | Wt 289.8 lb

## 2016-05-04 DIAGNOSIS — G4733 Obstructive sleep apnea (adult) (pediatric): Secondary | ICD-10-CM

## 2016-05-04 NOTE — Progress Notes (Signed)
09/03/15- 55 yoM former smoker hosp f/u. Pt was seen for acute angioedema. Pt states swelling has resolved. Pt c/o SOB with exertion. Pt c/o of LE edema for several weeks. Hx ICM, CAD, OSA, CHF, Asthma From DC summary: He started having episodes with face swelling in 2013, and currently has 3-4 episodes per year. He has never had an episode this severe. He has not had a workup for these, but was under the impression that they were allergic in nature. He generally has an epi-pen and self-administers epinephrine, which usually causes his symptoms to resolve. He has not identified any triggers. He does not report any urticaria that accompanies his face swelling. No hx of recent ACE-inhibitor or ARB exposure. In the ED, he was treated with steroids, antihistamines, nebulized racimic epinpheine, was evaluated by anesthesia and ENT, and in conference, it was elected to treat for acute C1-esterase inhibitor deficiency with C1-esterase inhibitor concentrate( [place]). The patient was admitted for further evaluation. He had significant improvement in symptoms. Hospital course complicated by acute on chronic renal failure with serum creatinine rising to 2.3. Home blood pressure regimen was restarted prior to discharge. Home lasix and HCTZ were held due to AKI. Lab: C1 estrerase inhibitor level  31( 21-39) on 8/27; Tryptase 16.6 (H); sed rate, CRP, C4 complement were Nl, LFTs w/o enzyme elevation Dr Doylene Canard is cardiologist-recent appointment with labs drawn. The history is that this gentleman began experiencing angioedema while incarcerated in 2013. Initial episodes were happening every few weeks but subsequently have been perennial, about once a month. He is going to the emergency room several times. He has used an EpiPen which does accelerate clearing, but is currently unaffordable. He has not recognized an obvious trigger and has not been on Ace inhibitors or ARBs in a very long time. No recognized problem with  latex, aspirin, specific foods. He denies history of environmental allergy/rhinitis/asthma. He has had previous reactions to lidocaine, lisinopril, penicillins. He denies any similar family history. He has a diagnosis of obstructive sleep apnea with at least 2 previous sleep studies elsewhere. Currently no DME company and his CPAP machine does not work and has not been used in a long time.  01/05/2016-55 year old male former smoker followed for angioedema, OSA complicated by chronic kidney disease, HBP Follows For: Angioedema. Pt reports 2 recent episodes of angioedema that he did use his Epipen for and symptoms resolved. Pt does not know what is triggering these episodes. Pt states that it starts with tingling and then swelling that includes hands, feet and face. male former smoker followed for angioedema, OSA complicated by chronic kidney disease, HBP Follows For: Angioedema. Pt reports 2 recent episodes of angioedema that he did use his Epipen for and symptoms resolved. Pt does not know what is triggering these episodes. Pt states that it starts with tingling and then swelling that includes hands, feet and face.  Allergy labs 09/03/2015- alpha gal NEG, total IgE 151, elevated food IgE for wheat, peanut, soybean, corn, tomato, orange, apple;  Elevated monocytes on peripheral smear, sedimentation rate 29, elevated environmental allergen IgE for dust mites, dog, grass pollens, tree pollens, fall weeds. Angioedema panel negative. One episode of angioedema before Christmas had one episode after. Much less frequent than before. He recognizes prodrome of tingling in his hands and episodes start. EpiPen and Benadryl was slow it down until he gets to hospital for treatment. No airway involvement. Between episodes he is well. Triggers still not recognized.  05/04/2016-55 year old male former smoker followed for angioedema, OSA, complicated by a CK D, HBP FOLLOWS FOR: Pt states few days ago his hands started tingling and he took Benadryl to help-had not gotten into anything new or different. Pt denies any wheezing, cough, or SOB at this time. former smoker followed for angioedema, OSA, complicated by a CK D, HBP FOLLOWS FOR: Pt states few days ago his hands started tingling and he took Benadryl to help-had not gotten into anything new or different. Pt denies any wheezing, cough, or SOB at this time.    ROS-see HPI   Negative unless "+" Constitutional:    weight loss, night  sweats, fevers, chills, fatigue, lassitude. HEENT:    headaches, difficulty swallowing, tooth/dental problems, sore throat,       sneezing,  itching, ear ache, nasal congestion, post nasal drip, snoring CV:    chest pain, orthopnea, PND, swelling in lower extremities, anasarca,                                                        dizziness, palpitations Resp:   shortness of breath with exertion or at rest.                productive cough,   non-productive cough, coughing up of blood.              change in color of mucus.  wheezing.   Skin:    +HPI GI:  No-   heartburn, indigestion, abdominal pain, nausea, vomiting, diarrhea,                 change in bowel habits, loss of appetite GU: dysuria, change in color of urine, no urgency or frequency.   flank pain. MS:   joint pain, stiffness, decreased range of motion, back pain. Neuro-     nothing unusual Psych:  change in mood or affect.  depression or anxiety.   memory loss.  OBJ- Physical Exam General- Alert, Oriented, Affect-appropriate, Distress- none acute, + obese Skin- + pink area right bicep he thinks might be residual from most recent set of hives a couple of weeks ago. Lymphadenopathy- none Head- atraumatic            Eyes- Gross vision intact, PERRLA, conjunctivae and secretions clear            Ears- Hearing, canals-normal            Nose- Clear, no-Septal dev, mucus, polyps, erosion, perforation             Throat- Mallampati IV , mucosa clear , drainage- none, tonsils- atrophic Neck- flexible , trachea midline, no stridor , thyroid nl, carotid no bruit Chest - symmetrical excursion , unlabored           Heart/CV- RRR , no murmur , no gallop  , no rub, nl s1 s2                           - JVD- none , edema- none, stasis changes- none, varices- none           Lung- clear to P&A, wheeze- none, cough- none , dullness-none, rub- none           Chest wall-  Abd-  Br/ Gen/ Rectal- Not done, not indicated Extrem- cyanosis- none, clubbing, none, atrophy- none, strength- nl Neuro- grossly intact to observation

## 2016-05-04 NOTE — Patient Instructions (Addendum)
Benadryl is ok if needed for swelling and hives. One of the newer antihistamines like claritin or allegra is lees likely to cause sleepiness.  Order- Schedule sleep study- either unattended HSA, or split protocol NPSG     Dx OSA  Please call as needed

## 2016-05-07 MED FILL — ATORVASTATIN 40 MG TABLET: 40 | 30 days supply | Qty: 30 | Fill #2

## 2016-05-07 MED FILL — ?SPIRONOLACTONE 25 MG TABLE: 25 | 30 days supply | Qty: 30 | Fill #1

## 2016-05-07 MED FILL — ?CARVEDILOL 25 MG TABLET: 25 | 30 days supply | Qty: 60 | Fill #1

## 2016-05-07 MED FILL — POTASSIUM CL 10 MEQ TAB SA: 10 | 30 days supply | Qty: 30 | Fill #2

## 2016-05-18 DIAGNOSIS — G4733 Obstructive sleep apnea (adult) (pediatric): Secondary | ICD-10-CM | POA: Diagnosis not present

## 2016-05-26 DIAGNOSIS — G4733 Obstructive sleep apnea (adult) (pediatric): Secondary | ICD-10-CM | POA: Diagnosis not present

## 2016-05-26 MED FILL — AMLODIPINE BESYLATE 10 MG T: 10 | 30 days supply | Qty: 30 | Fill #3

## 2016-05-31 ENCOUNTER — Other Ambulatory Visit: Payer: Self-pay | Admitting: *Deleted

## 2016-05-31 DIAGNOSIS — G4733 Obstructive sleep apnea (adult) (pediatric): Secondary | ICD-10-CM

## 2016-06-01 ENCOUNTER — Ambulatory Visit (INDEPENDENT_AMBULATORY_CARE_PROVIDER_SITE_OTHER): Payer: BLUE CROSS/BLUE SHIELD | Admitting: Internal Medicine

## 2016-06-01 ENCOUNTER — Telehealth: Payer: Self-pay | Admitting: *Deleted

## 2016-06-01 ENCOUNTER — Encounter: Payer: Self-pay | Admitting: Internal Medicine

## 2016-06-01 VITALS — BP 120/78 | HR 76 | Ht 67.0 in | Wt 279.2 lb

## 2016-06-01 DIAGNOSIS — G4733 Obstructive sleep apnea (adult) (pediatric): Secondary | ICD-10-CM | POA: Diagnosis not present

## 2016-06-01 DIAGNOSIS — I5042 Chronic combined systolic (congestive) and diastolic (congestive) heart failure: Secondary | ICD-10-CM

## 2016-06-01 NOTE — Assessment & Plan Note (Signed)
Severe obstructive sleep apnea. Sleep study was reviewed with him. Plan-continue CPAP therapy, replacing old worn out machine and establishing with DME. Start with AutoPap 5-20

## 2016-06-01 NOTE — Telephone Encounter (Signed)
Dr. Annamaria Boots requested that message be sent to Katie to schedule patient's follow up in 3 months. To Katie.

## 2016-06-01 NOTE — Patient Instructions (Addendum)
Order DME ( is on [employer]) replacement for old worn out CPAP machine, auto 5-20, mask of choice, humidifier, supplies, AirView     Dx OSA  Based on HST 05/18/16  We will help you set up a return visit

## 2016-06-01 NOTE — Progress Notes (Signed)
09/03/15- 8 yoM former smoker hosp f/u. Pt was seen for acute angioedema. Pt states swelling has resolved. Pt c/o SOB with exertion. Pt c/o of LE edema for several weeks. Hx ICM, CAD, OSA, CHF, Asthma From DC summary: He started having episodes with face swelling in 2013, and currently has 3-4 episodes per year. He has never had an episode this severe. He has not had a workup for these, but was under the impression that they were allergic in nature. He generally has an epi-pen and self-administers epinephrine, which usually causes his symptoms to resolve. He has not identified any triggers. He does not report any urticaria that accompanies his face swelling. No hx of recent ACE-inhibitor or ARB exposure. In the ED, he was treated with steroids, antihistamines, nebulized racimic epinpheine, was evaluated by anesthesia and ENT, and in conference, it was elected to treat for acute C1-esterase inhibitor deficiency with C1-esterase inhibitor concentrate( [place]). The patient was admitted for further evaluation. He had significant improvement in symptoms. Hospital course complicated by acute on chronic renal failure with serum creatinine rising to 2.3. Home blood pressure regimen was restarted prior to discharge. Home lasix and HCTZ were held due to AKI. Lab: C1 estrerase inhibitor level  31( 21-39) on 8/27; Tryptase 16.6 (H); sed rate, CRP, C4 complement were Nl, LFTs w/o enzyme elevation Dr Doylene Canard is cardiologist-recent appointment with labs drawn. The history is that this gentleman began experiencing angioedema while incarcerated in 2013. Initial episodes were happening every few weeks but subsequently have been perennial, about once a month. He is going to the emergency room several times. He has used an EpiPen which does accelerate clearing, but is currently unaffordable. He has not recognized an obvious trigger and has not been on Ace inhibitors or ARBs in a very long time. No recognized problem with  latex, aspirin, specific foods. He denies history of environmental allergy/rhinitis/asthma. He has had previous reactions to lidocaine, lisinopril, penicillins. He denies any similar family history. He has a diagnosis of obstructive sleep apnea with at least 2 previous sleep studies elsewhere. Currently no DME company and his CPAP machine does not work and has not been used in a long time.  01/05/2016-55 year old male former smoker followed for angioedema, OSA complicated by chronic kidney disease, HBP Follows For: Angioedema. Pt reports 2 recent episodes of angioedema that he did use his Epipen for and symptoms resolved. Pt does not know what is triggering these episodes. Pt states that it starts with tingling and then swelling that includes hands, feet and face.  Allergy labs 09/03/2015- alpha gal NEG, total IgE 151, elevated food IgE for wheat, peanut, soybean, corn, tomato, orange, apple;  Elevated monocytes on peripheral smear, sedimentation rate 29, elevated environmental allergen IgE for dust mites, dog, grass pollens, tree pollens, fall weeds. Angioedema panel negative. One episode of angioedema before Christmas had one episode after. Much less frequent than before. He recognizes prodrome of tingling in his hands and episodes start. EpiPen and Benadryl was slow it down until he gets to hospital for treatment. No airway involvement. Between episodes he is well. Triggers still not recognized.  05/04/2016-55 year old male former smoker followed for angioedema, OSA, complicated by a CK D, HBP FOLLOWS FOR: Pt states few days ago his hands started tingling and he took Benadryl to help-had not gotten into anything new or different. Pt denies any wheezing, cough, or SOB at this time.   06/01/2016-55 year old male former smoker followed for Angioedema, OSA, complicated by CKD, HBP FOLLOW FOR:  Sleep Study results.  Unattended Home Sleep Test 05/18/2016-AHI 60.7/hour, desaturation to 79%, body weight  289.5 pounds We reviewed his sleep study and discussed medical significance. He needed the study to document ongoing need for CPAP so he could replace his old machine. He is still been trying to use his old machine but he says he "fights with it every night". No angioedema since last here.  ROS-see HPI   Negative unless "+" Constitutional:    weight loss, night sweats, fevers, chills, fatigue, lassitude. HEENT:    headaches, difficulty swallowing, tooth/dental problems, sore throat,       sneezing, itching, ear ache, nasal congestion, post nasal drip, snoring CV:    chest pain, orthopnea, PND, swelling in lower extremities, anasarca,                                                        dizziness, palpitations Resp:   shortness of breath with exertion or at rest.                productive cough,   non-productive cough, coughing up of blood.              change in color of mucus.  wheezing.   Skin:   Clear GI:  No-   heartburn, indigestion, abdominal pain, nausea, vomiting, diarrhea,                 change in bowel habits, loss of appetite GU: dysuria, change in color of urine, no urgency or frequency.   flank pain. MS:   joint pain, stiffness, decreased range of motion, back pain. Neuro-     nothing unusual Psych:  change in mood or affect.  depression or anxiety.   memory loss.  OBJ- Physical Exam General- Alert, Oriented, Affect-appropriate, Distress- none acute, + obese Skin-clear Lymphadenopathy- none Head- atraumatic            Eyes- Gross vision intact, PERRLA, conjunctivae and secretions clear            Ears- Hearing, canals-normal            Nose- Clear, no-Septal dev, mucus, polyps, erosion, perforation             Throat- Mallampati IV , mucosa clear , drainage- none, tonsils- atrophic Neck- flexible , trachea midline, no stridor , thyroid nl, carotid no bruit Chest - symmetrical excursion , unlabored           Heart/CV- RRR , no murmur , no gallop  , no rub, nl s1 s2                            - JVD- none , edema- none, stasis changes- none, varices- none           Lung- clear to P&A, wheeze- none, cough- none , dullness-none, rub- none           Chest wall-  Abd-  Br/ Gen/ Rectal- Not done, not indicated Extrem- cyanosis- none, clubbing, none, atrophy- none, strength- nl Neuro- grossly intact to observation, + yawning

## 2016-06-03 NOTE — Telephone Encounter (Signed)
Pt can be seen on Friday 09-03-16 at 11:15am slot (30 minute slot) for 3 month ROV. Thanks.

## 2016-06-03 NOTE — Telephone Encounter (Signed)
Called spoke with pt. appt scheduled. Nothing further needed 

## 2016-06-14 MED FILL — SPIRONOLACTONE 25 MG TABLET: 25 | 30 days supply | Qty: 30 | Fill #2

## 2016-06-14 MED FILL — BIDIL TABLET: 20-37.5 | 30 days supply | Qty: 90 | Fill #2

## 2016-06-22 MED FILL — POTASSIUM CL 10 MEQ TAB SA: 10 | 30 days supply | Qty: 30 | Fill #1

## 2016-06-25 MED FILL — ?CARVEDILOL 25 MG TABLET: 25 | 30 days supply | Qty: 60 | Fill #2

## 2016-07-01 MED FILL — ?ATORVASTATIN 40MG TABLET: 40 | 30 days supply | Qty: 30 | Fill #3

## 2016-07-01 MED FILL — ?AMLODIPINE BESYLATE 10 MG: 10 | 30 days supply | Qty: 30 | Fill #4

## 2016-07-20 ENCOUNTER — Other Ambulatory Visit: Payer: Self-pay | Admitting: Family Medicine

## 2016-07-20 MED FILL — SPIRONOLACTONE 25 MG TABLET: 25 | 30 days supply | Qty: 30 | Fill #3

## 2016-08-11 MED FILL — AMLODIPINE BESYLATE 10 MG T: 10 | 30 days supply | Qty: 30 | Fill #5

## 2016-08-11 MED FILL — POTASSIUM CL 10 MEQ TAB SA: 10 | 30 days supply | Qty: 30 | Fill #2

## 2016-08-11 MED FILL — CARVEDILOL 25 MG TABLET: 25 | 30 days supply | Qty: 60 | Fill #3

## 2016-08-13 DIAGNOSIS — Z95810 Presence of automatic (implantable) cardiac defibrillator: Secondary | ICD-10-CM | POA: Insufficient documentation

## 2016-08-13 DIAGNOSIS — J449 Chronic obstructive pulmonary disease, unspecified: Secondary | ICD-10-CM | POA: Diagnosis not present

## 2016-08-13 DIAGNOSIS — Z7982 Long term (current) use of aspirin: Secondary | ICD-10-CM | POA: Insufficient documentation

## 2016-08-13 DIAGNOSIS — Z87891 Personal history of nicotine dependence: Secondary | ICD-10-CM | POA: Insufficient documentation

## 2016-08-13 DIAGNOSIS — I2510 Atherosclerotic heart disease of native coronary artery without angina pectoris: Secondary | ICD-10-CM | POA: Insufficient documentation

## 2016-08-13 DIAGNOSIS — I130 Hypertensive heart and chronic kidney disease with heart failure and stage 1 through stage 4 chronic kidney disease, or unspecified chronic kidney disease: Secondary | ICD-10-CM | POA: Insufficient documentation

## 2016-08-13 DIAGNOSIS — T7840XA Allergy, unspecified, initial encounter: Secondary | ICD-10-CM | POA: Insufficient documentation

## 2016-08-13 DIAGNOSIS — I5042 Chronic combined systolic (congestive) and diastolic (congestive) heart failure: Secondary | ICD-10-CM | POA: Diagnosis not present

## 2016-08-13 DIAGNOSIS — N183 Chronic kidney disease, stage 3 (moderate): Secondary | ICD-10-CM | POA: Insufficient documentation

## 2016-08-13 DIAGNOSIS — Z955 Presence of coronary angioplasty implant and graft: Secondary | ICD-10-CM | POA: Insufficient documentation

## 2016-08-13 NOTE — ED Provider Notes (Signed)
[facility] DEPT Provider Note   CSN: [id number] Arrival date & time: 08/13/16  2354  By signing my name below, I, Evelene Croon, attest that this documentation has been prepared under the direction and in the presence of [name], DO . Electronically Signed: Evelene Croon, Scribe. 08/14/2016. 12:06 AM.   History   Chief Complaint Chief Complaint  Patient presents with  . Allergic Reaction    The history is provided by the patient. No language interpreter was used.     HPI Comments:  Victor Thompson is a 55 y.o. male who presents to the Emergency Department complaining of worsening swelling to his tongue which began ~ 30 minutes PTA. Pt reports associated itching and swelling of his bilateral hands. He denies rash. He denies having new foods or drinks. Pt has a h/o a similar episode and has an epi pen as a result however was unable to find his epi pen this evening.   Past Medical History:  Diagnosis Date  . AICD (automatic cardioverter/defibrillator) present   . Cardiac arrest ([place])   . CHF (congestive heart failure) ([place])   . Coronary artery disease   . Hypertension   . Obesity   . Pulmonary edema   . Shortness of breath dyspnea   . Sleep apnea    USES CPAP    Patient Active Problem List   Diagnosis Date Noted  . Ventricular tachyarrhythmia ([place]) 03/29/2016  . COPD exacerbation ([place]) 03/04/2016  . Cardiac arrest with ventricular fibrillation ([place]) 03/04/2016  . Defibrillator discharge   . Ventricular fibrillation ([place])   . Chronic left shoulder pain 02/05/2016  . Chronic combined systolic and diastolic CHF (congestive heart failure) ([place]) 01/22/2016  . Erectile dysfunction 01/22/2016  . Allergic rhinitis 01/22/2016  . GERD (gastroesophageal reflux disease) 01/22/2016  . CKD (chronic kidney disease) stage 3, GFR 30-59 ml/min 01/22/2016  . Angioedema 04/24/2010  . PROTEINURIA 02/17/2010  . Cardiomyopathy, ischemic 02/16/2010  . Obstructive sleep apnea 02/16/2010  .  PENILE PAIN 01/16/2010  . SHOULDER STRAIN, LEFT 01/16/2010  . EXTERNAL HEMORRHOIDS 09/26/2009  . Tobacco use disorder in remission 06/03/2009  . [place], RIGHT 06/03/2009  . Essential hypertension 10/01/2008  . RECTAL BLEEDING 10/01/2008  . DENTAL CARIES 11/08/2007  . Hyperlipidemia 07/14/2007  . OBESITY 07/14/2007  . Coronary atherosclerosis 07/14/2007  . Asthma 07/14/2007  . ERECTILE DYSFUNCTION 12/06/2005    Past Surgical History:  Procedure Laterality Date  . CORONARY STENT PLACEMENT    . IMPLANTABLE CARDIOVERTER DEFIBRILLATOR IMPLANT         Home Medications    Prior to Admission medications   Medication Sig Start Date End Date Taking? Authorizing Provider  albuterol (PROVENTIL HFA;VENTOLIN HFA) 108 (90 Base) MCG/ACT inhaler Inhale 2 puffs into the lungs every 4 (four) hours as needed for wheezing or shortness of breath. 02/06/16  Yes Arnoldo Morale, MD  amLODipine (NORVASC) 10 MG tablet Take 1 tablet by mouth daily.  03/10/16  Yes Historical Provider, MD  aspirin EC 81 MG tablet Take 1 tablet (81 mg total) by mouth daily. 01/22/16  Yes Josalyn [name], MD  atorvastatin (LIPITOR) 40 MG tablet Take 1 tablet (40 mg total) by mouth at bedtime. 01/22/16  Yes Josalyn [name], MD  beclomethasone (QVAR) 40 MCG/ACT inhaler Inhale 1 puff into the lungs 2 (two) times daily.   Yes Historical Provider, MD  carvedilol (COREG) 25 MG tablet Take 1 tablet (25 mg total) by mouth 2 (two) times daily with a meal. 03/06/16  Yes Charolette Forward,  MD  EPINEPHrine 0.3 mg/0.3 mL IJ SOAJ injection Inject 0.3 mLs (0.3 mg total) into the muscle once. 02/06/16  Yes Josalyn [name], MD  isosorbide-hydrALAZINE (BIDIL) 20-37.5 MG tablet Take 1 tablet by mouth 2 (two) times daily.   Yes Historical Provider, MD  loratadine (CLARITIN) 10 MG tablet Take 1 tablet (10 mg total) by mouth daily as needed for allergies. 01/22/16  Yes Josalyn [name], MD  montelukast (SINGULAIR) 10 MG tablet Take 10 mg by mouth at bedtime.     Yes Historical Provider, MD  nitroGLYCERIN (NITROSTAT) 0.4 MG SL tablet Place 1 tablet (0.4 mg total) under the tongue every 5 (five) minutes x 3 doses as needed for chest pain. 03/06/16  Yes Charolette Forward, MD  potassium chloride (K-DUR) 10 MEQ tablet Take 1 tablet by mouth daily.  03/10/16  Yes Historical Provider, MD  spironolactone (ALDACTONE) 25 MG tablet Take 1 tablet (25 mg total) by mouth daily. 03/06/16  Yes Charolette Forward, MD  traMADol (ULTRAM) 50 MG tablet Take 50 mg by mouth every 6 (six) hours as needed.   Yes Historical Provider, MD  EPINEPHrine 0.3 mg/0.3 mL IJ SOAJ injection Inject 0.3 mLs (0.3 mg total) into the muscle once. 08/14/16 08/14/16  [name], DO  famotidine (PEPCID) 20 MG tablet Take 1 tablet (20 mg total) by mouth 2 (two) times daily. Patient not taking: Reported on 08/14/2016 01/22/16   Boykin Nearing, MD  predniSONE (DELTASONE) 20 MG tablet 2 tabs po daily x 4 days 08/14/16   [name], DO  PRESCRIPTION MEDICATION Inhale into the lungs at bedtime. CPAP    Historical Provider, MD    Family History Family History  Problem Relation Age of Onset  . Hypertension Mother     Social History Social History  Substance Use Topics  . Smoking status: Former Smoker    Quit date: 12/06/2008  . Smokeless tobacco: Never Used  . Alcohol use No     Allergies   Ace inhibitors; Lidocaine hcl; Lisinopril; and Penicillins   Review of Systems Review of Systems  HENT: Positive for facial swelling (tongue). Negative for trouble swallowing.   Respiratory: Negative for shortness of breath and stridor.   Musculoskeletal:       + Hand swelling  Skin: Negative for rash.  All other systems reviewed and are negative.  Physical Exam Updated Vital Signs BP 143/86   Pulse 78   Temp 98.1 F (36.7 C) (Oral)   Resp 19   SpO2 96%   Physical Exam  Constitutional: He is oriented to person, place, and time. He appears well-developed and well-nourished.  HENT:  Head: Normocephalic and  atraumatic.  Swollen tongue No noted lip swelling Handling secretions  Eyes: EOM are normal. Pupils are equal, round, and reactive to light.  Neck: Normal range of motion. Neck supple. No JVD present.  Cardiovascular: Normal rate and regular rhythm.  Exam reveals no gallop and no friction rub.   No murmur heard. Pulmonary/Chest: No respiratory distress. He has no wheezes.  Abdominal: Soft. He exhibits no distension. There is no tenderness. There is no rebound and no guarding.  Musculoskeletal: Normal range of motion.  Swelling noted to bilateral hands   Neurological: He is alert and oriented to person, place, and time.  Skin: No rash noted. No pallor.  Psychiatric: He has a normal mood and affect. His behavior is normal.  Nursing note and vitals reviewed.    ED Treatments / Results  DIAGNOSTIC STUDIES:  Oxygen Saturation is 96%  on RA, normal by my interpretation.    COORDINATION OF CARE:  12:02 AM Discussed treatment plan with pt at bedside and pt agreed to plan.  Labs (all labs ordered are listed, but only abnormal results are displayed) Labs Reviewed - No data to display  EKG  EKG Interpretation None       Radiology No results found.  Procedures Procedures (including critical care time)  Medications Ordered in ED Medications  EPINEPHrine (EPI-PEN) injection 0.3 mg (0.3 mg Intramuscular Given 08/14/16 0009)  methylPREDNISolone sodium succinate (SOLU-MEDROL) 125 mg/2 mL injection 125 mg (125 mg Intravenous Given 08/14/16 0009)  diphenhydrAMINE (BENADRYL) injection 50 mg (50 mg Intravenous Given 08/14/16 0009)  famotidine (PEPCID) IVPB 20 mg premix (0 mg Intravenous Stopped 08/14/16 0046)     Initial Impression / Assessment and Plan / ED Course  I have reviewed the triage vital signs and the nursing notes.  Pertinent labs & imaging results that were available during my care of the patient were reviewed by me and considered in my medical decision making (see chart for  details).  Clinical Course    55 yo M with tongue and hand swelling.  Going on for past 30 min.  Hx of similar in the past.  Patient thinks its an allergic reaction.    Give epi with rapid oral swelling, discussed possible intubation with patient who declines this as a possibility.   Symptoms improved with epi.  Obs for 4 hours with continued improvement.  D/c home, burst dose steroids.  4:23 AM:  I have discussed the diagnosis/risks/treatment options with the patient and family and believe the pt to be eligible for discharge home to follow-up with PCP. We also discussed returning to the ED immediately if new or worsening sx occur. We discussed the sx which are most concerning (e.g., worsening swelling, sob, syncope that necessitate immediate return. Medications administered to the patient during their visit and any new prescriptions provided to the patient are listed below.  Medications given during this visit Medications  EPINEPHrine (EPI-PEN) injection 0.3 mg (0.3 mg Intramuscular Given 08/14/16 0009)  methylPREDNISolone sodium succinate (SOLU-MEDROL) 125 mg/2 mL injection 125 mg (125 mg Intravenous Given 08/14/16 0009)  diphenhydrAMINE (BENADRYL) injection 50 mg (50 mg Intravenous Given 08/14/16 0009)  famotidine (PEPCID) IVPB 20 mg premix (0 mg Intravenous Stopped 08/14/16 0046)     The patient appears reasonably screen and/or stabilized for discharge and I doubt any other medical condition or other [facility] requiring further screening, evaluation, or treatment in the ED at this time prior to discharge.      Final Clinical Impressions(s) / ED Diagnoses   Final diagnoses:  Allergic reaction, initial encounter    New Prescriptions New Prescriptions   EPINEPHRINE 0.3 MG/0.3 ML IJ SOAJ INJECTION    Inject 0.3 mLs (0.3 mg total) into the muscle once.   PREDNISONE (DELTASONE) 20 MG TABLET    2 tabs po daily x 4 days   I personally performed the services described in this documentation, which  was scribed in my presence. The recorded information has been reviewed and is accurate.      [name], DO 08/14/16 [phone]

## 2016-08-14 ENCOUNTER — Emergency Department (HOSPITAL_COMMUNITY)
Admission: EM | Admit: 2016-08-14 | Discharge: 2016-08-14 | Disposition: A | Discharge status: Home / Self Care | Payer: BLUE CROSS/BLUE SHIELD | Attending: Emergency Medicine | Admitting: Emergency Medicine

## 2016-08-14 ENCOUNTER — Encounter (HOSPITAL_COMMUNITY): Payer: Self-pay | Admitting: Emergency Medicine

## 2016-08-14 DIAGNOSIS — T7840XA Allergy, unspecified, initial encounter: Secondary | ICD-10-CM

## 2016-08-14 MED ORDER — METHYLPREDNISOLONE SODIUM SUCC 125 MG IJ SOLR
125.0000 mg | Freq: Once | INTRAMUSCULAR | Status: AC
  Administered 2016-08-14: 125 mg via INTRAVENOUS
  Filled 2016-08-14: qty 2

## 2016-08-14 MED ORDER — PREDNISONE 20 MG PO TABS: ORAL_TABLET | ORAL | 0 refills | Status: DC

## 2016-08-14 MED ORDER — FAMOTIDINE IN NACL 20-0.9 MG/50ML-% IV SOLN
20.0000 mg | Freq: Once | INTRAVENOUS | Status: AC
  Administered 2016-08-14: 20 mg via INTRAVENOUS
  Filled 2016-08-14: qty 50

## 2016-08-14 MED ORDER — EPINEPHRINE 0.3 MG/0.3ML IJ SOAJ: 0.3000 mg | Freq: Once | INTRAMUSCULAR | 0 refills | Status: DC

## 2016-08-14 MED ORDER — EPINEPHRINE 0.3 MG/0.3ML IJ SOAJ
0.3000 mg | Freq: Once | INTRAMUSCULAR | Status: AC
  Administered 2016-08-14: 0.3 mg via INTRAMUSCULAR
  Filled 2016-08-14: qty 0.3

## 2016-08-14 MED ORDER — DIPHENHYDRAMINE HCL 50 MG/ML IJ SOLN
50.0000 mg | Freq: Once | INTRAMUSCULAR | Status: AC
  Administered 2016-08-14: 50 mg via INTRAVENOUS
  Filled 2016-08-14: qty 1

## 2016-08-14 NOTE — ED Notes (Signed)
Pt departed in NAD, refused use of wheelchair.  

## 2016-08-14 NOTE — ED Notes (Signed)
EDP at bedside  

## 2016-08-14 NOTE — ED Triage Notes (Signed)
Pt arrived to ED via POV. This evening around 9pm, pt ate pork chop sandwich and macaroni and cheese. A few hours later, pt woke up having symptoms of allergic reaction, fingers tingling, and tongue swelling.

## 2016-08-17 MED FILL — BIDIL TABLET: 20-37.5 | 30 days supply | Qty: 90 | Fill #3

## 2016-08-18 MED FILL — ATORVASTATIN 40 MG TABLET: 40 | 30 days supply | Qty: 30 | Fill #4

## 2016-08-31 ENCOUNTER — Other Ambulatory Visit: Payer: Self-pay | Admitting: Family Medicine

## 2016-08-31 NOTE — Telephone Encounter (Signed)
Received refill request for tramadol Patient needs OV to evaluate pain

## 2016-09-03 ENCOUNTER — Ambulatory Visit: Payer: BLUE CROSS/BLUE SHIELD | Admitting: Internal Medicine

## 2016-09-07 ENCOUNTER — Other Ambulatory Visit: Payer: Self-pay | Admitting: Family Medicine

## 2016-09-09 NOTE — Telephone Encounter (Signed)
Tramadol ready for pick up Please inform patient

## 2016-09-13 ENCOUNTER — Other Ambulatory Visit: Payer: Self-pay

## 2016-09-13 MED ORDER — SPIRONOLACTONE 25 MG PO TABS: 25.0000 mg | ORAL_TABLET | Freq: Every day | ORAL | 3 refills | Status: DC

## 2016-09-13 MED FILL — ?SPIRONOLACTONE 25 MG TABLE: 25 MG | 30 days supply | Qty: 30 | Fill #0

## 2016-09-13 MED FILL — traMADol HCL 50 MG TABS: 50 | 20 days supply | Qty: 60 | Fill #0

## 2016-09-30 ENCOUNTER — Other Ambulatory Visit: Payer: Self-pay | Admitting: Family Medicine

## 2016-09-30 DIAGNOSIS — I10 Essential (primary) hypertension: Secondary | ICD-10-CM

## 2016-10-05 ENCOUNTER — Other Ambulatory Visit: Payer: Self-pay | Admitting: Family Medicine

## 2016-10-05 DIAGNOSIS — I10 Essential (primary) hypertension: Secondary | ICD-10-CM

## 2016-10-05 MED FILL — ?ATORVASTATIN 40MG TABLET: 40 | 30 days supply | Qty: 30 | Fill #5

## 2016-10-07 MED FILL — ?SPIRONOLACTONE 25 MG TABLE: 25 MG | 30 days supply | Qty: 30 | Fill #0

## 2016-10-07 MED FILL — ?AMLODIPINE BESYLATE 5 MG T: 5 | 30 days supply | Qty: 30 | Fill #0

## 2016-10-07 MED FILL — CARVEDILOL 25 MG TABLET: 25 | 30 days supply | Qty: 60 | Fill #0

## 2016-10-07 MED FILL — POTASSIUM CL 10 MEQ TAB SA: 10 | 30 days supply | Qty: 30 | Fill #0

## 2016-10-21 MED FILL — BIDIL TABLET: 20-37.5 | 30 days supply | Qty: 90 | Fill #4

## 2016-11-01 MED FILL — CARVEDILOL 25 MG TABLET: 25 | 30 days supply | Qty: 60 | Fill #1

## 2016-11-15 MED FILL — AMLODIPINE BESYLATE 5 MG TA: 5 | 30 days supply | Qty: 30 | Fill #1

## 2016-11-16 ENCOUNTER — Other Ambulatory Visit: Payer: Self-pay

## 2016-11-16 MED ORDER — ISOSORB DINITRATE-HYDRALAZINE 20-37.5 MG PO TABS: 1.0000 | ORAL_TABLET | Freq: Two times a day (BID) | ORAL | 3 refills | Status: DC

## 2016-11-16 NOTE — Telephone Encounter (Signed)
Printed script for pass. 

## 2016-11-22 ENCOUNTER — Other Ambulatory Visit: Payer: Self-pay | Admitting: Family Medicine

## 2016-11-22 DIAGNOSIS — E785 Hyperlipidemia, unspecified: Secondary | ICD-10-CM

## 2016-11-22 DIAGNOSIS — I2510 Atherosclerotic heart disease of native coronary artery without angina pectoris: Secondary | ICD-10-CM

## 2016-11-22 MED FILL — POTASSIUM CL 10 MEQ TAB SA: 10 | 30 days supply | Qty: 30 | Fill #1

## 2016-11-22 MED FILL — ATORVASTATIN 40 MG TABLET: 40 | 30 days supply | Qty: 30 | Fill #0

## 2016-12-07 MED FILL — ?AZITHROMYCIN 250 MG TABLET: 250 | 5 days supply | Qty: 6 | Fill #0

## 2016-12-08 MED FILL — ?SPIRONOLACTONE 25 MG TABLE: 25 MG | 30 days supply | Qty: 30 | Fill #1

## 2016-12-15 MED FILL — CARVEDILOL 25 MG TABLET: 25 | 30 days supply | Qty: 60 | Fill #2

## 2016-12-23 MED FILL — BIDIL TABLET: 20-37.5 | 30 days supply | Qty: 90 | Fill #5

## 2016-12-23 MED FILL — ?AMLODIPINE BESYLATE 5 MG T: 5 | 30 days supply | Qty: 30 | Fill #2

## 2016-12-29 MED FILL — POTASSIUM CL 10 MEQ TAB SA: 10 | 30 days supply | Qty: 30 | Fill #2

## 2017-01-14 MED FILL — ATORVASTATIN 40 MG TABLET: 40 | 30 days supply | Qty: 30 | Fill #1

## 2017-01-14 MED FILL — traMADol HCL 50 MG TABS: 50 | 7 days supply | Qty: 30 | Fill #0

## 2017-01-14 MED FILL — SPIRONOLACTONE 25 MG TABLET: 25 | 30 days supply | Qty: 30 | Fill #2

## 2017-01-14 MED FILL — FAMOTIDINE 20 MG TABLET: 20 | 30 days supply | Qty: 60 | Fill #1

## 2017-01-25 MED FILL — CARVEDILOL 25 MG TABLET: 25 | 30 days supply | Qty: 60 | Fill #3

## 2017-02-16 MED FILL — ?AMLODIPINE BESYLATE 5 MG T: 5 | 30 days supply | Qty: 30 | Fill #3

## 2017-02-16 MED FILL — CARVEDILOL 25 MG TABLET: 25 | 30 days supply | Qty: 60 | Fill #4

## 2017-02-23 MED FILL — POTASSIUM CL 10 MEQ TAB SA: 10 | 30 days supply | Qty: 30 | Fill #3

## 2017-03-03 MED FILL — ATORVASTATIN 40 MG TABLET: 40 | 30 days supply | Qty: 30 | Fill #2

## 2017-03-07 ENCOUNTER — Encounter: Payer: Self-pay | Admitting: Cardiology

## 2017-03-09 ENCOUNTER — Other Ambulatory Visit: Payer: Self-pay | Admitting: Family Medicine

## 2017-03-09 DIAGNOSIS — I5042 Chronic combined systolic (congestive) and diastolic (congestive) heart failure: Secondary | ICD-10-CM

## 2017-03-09 MED FILL — SPIRONOLACTONE 25 MG TABLET: 25 | 30 days supply | Qty: 30 | Fill #3

## 2017-03-09 MED FILL — BIDIL TABLET: 20-37.5 | 30 days supply | Qty: 90 | Fill #0

## 2017-04-05 ENCOUNTER — Other Ambulatory Visit: Payer: Self-pay | Admitting: Family Medicine

## 2017-04-05 DIAGNOSIS — E785 Hyperlipidemia, unspecified: Secondary | ICD-10-CM

## 2017-04-05 DIAGNOSIS — I5042 Chronic combined systolic (congestive) and diastolic (congestive) heart failure: Secondary | ICD-10-CM

## 2017-04-05 DIAGNOSIS — I2510 Atherosclerotic heart disease of native coronary artery without angina pectoris: Secondary | ICD-10-CM

## 2017-04-05 MED FILL — AMLODIPINE BESYLATE 5 MG TA: 5 | 30 days supply | Qty: 30 | Fill #4

## 2017-04-05 MED FILL — CARVEDILOL 25 MG TABLET: 25 | 30 days supply | Qty: 60 | Fill #5

## 2017-04-05 MED FILL — ?SPIRONOLACTONE 25 MG TABLE: 25 | 30 days supply | Qty: 30 | Fill #4

## 2017-04-05 MED FILL — POTASSIUM CL 10 MEQ TAB SA: 10 | 30 days supply | Qty: 30 | Fill #4

## 2017-04-08 MED FILL — ATORVASTATIN 40 MG TABLET: 40 | 30 days supply | Qty: 30 | Fill #0

## 2017-04-09 ENCOUNTER — Inpatient Hospital Stay (HOSPITAL_COMMUNITY)
Admission: EM | Admit: 2017-04-09 | Discharge: 2017-04-11 | DRG: 916 | Disposition: A | Discharge status: Home / Self Care | Payer: BLUE CROSS/BLUE SHIELD | Attending: Pulmonary Disease | Admitting: Pulmonary Disease

## 2017-04-09 ENCOUNTER — Encounter (HOSPITAL_COMMUNITY): Payer: Self-pay | Admitting: Emergency Medicine

## 2017-04-09 DIAGNOSIS — I2510 Atherosclerotic heart disease of native coronary artery without angina pectoris: Secondary | ICD-10-CM | POA: Diagnosis present

## 2017-04-09 DIAGNOSIS — I5032 Chronic diastolic (congestive) heart failure: Secondary | ICD-10-CM | POA: Diagnosis present

## 2017-04-09 DIAGNOSIS — R0902 Hypoxemia: Secondary | ICD-10-CM | POA: Diagnosis not present

## 2017-04-09 DIAGNOSIS — I255 Ischemic cardiomyopathy: Secondary | ICD-10-CM | POA: Diagnosis present

## 2017-04-09 DIAGNOSIS — R0600 Dyspnea, unspecified: Secondary | ICD-10-CM

## 2017-04-09 DIAGNOSIS — I10 Essential (primary) hypertension: Secondary | ICD-10-CM | POA: Diagnosis not present

## 2017-04-09 DIAGNOSIS — R0603 Acute respiratory distress: Secondary | ICD-10-CM | POA: Diagnosis present

## 2017-04-09 DIAGNOSIS — Z95810 Presence of automatic (implantable) cardiac defibrillator: Secondary | ICD-10-CM

## 2017-04-09 DIAGNOSIS — Z880 Allergy status to penicillin: Secondary | ICD-10-CM

## 2017-04-09 DIAGNOSIS — N179 Acute kidney failure, unspecified: Secondary | ICD-10-CM | POA: Diagnosis present

## 2017-04-09 DIAGNOSIS — Z888 Allergy status to other drugs, medicaments and biological substances status: Secondary | ICD-10-CM

## 2017-04-09 DIAGNOSIS — T782XXA Anaphylactic shock, unspecified, initial encounter: Secondary | ICD-10-CM

## 2017-04-09 DIAGNOSIS — N189 Chronic kidney disease, unspecified: Secondary | ICD-10-CM | POA: Diagnosis present

## 2017-04-09 DIAGNOSIS — T783XXA Angioneurotic edema, initial encounter: Secondary | ICD-10-CM | POA: Diagnosis not present

## 2017-04-09 DIAGNOSIS — Z884 Allergy status to anesthetic agent status: Secondary | ICD-10-CM | POA: Diagnosis not present

## 2017-04-09 DIAGNOSIS — Z8674 Personal history of sudden cardiac arrest: Secondary | ICD-10-CM

## 2017-04-09 DIAGNOSIS — I130 Hypertensive heart and chronic kidney disease with heart failure and stage 1 through stage 4 chronic kidney disease, or unspecified chronic kidney disease: Secondary | ICD-10-CM | POA: Diagnosis present

## 2017-04-09 DIAGNOSIS — Z6841 Body Mass Index (BMI) 40.0 and over, adult: Secondary | ICD-10-CM | POA: Diagnosis not present

## 2017-04-09 DIAGNOSIS — T7809XA Anaphylactic reaction due to other food products, initial encounter: Principal | ICD-10-CM | POA: Diagnosis present

## 2017-04-09 DIAGNOSIS — E662 Morbid (severe) obesity with alveolar hypoventilation: Secondary | ICD-10-CM | POA: Diagnosis present

## 2017-04-09 DIAGNOSIS — T783XXD Angioneurotic edema, subsequent encounter: Secondary | ICD-10-CM | POA: Diagnosis not present

## 2017-04-09 LAB — I-STAT CHEM 8, ED
BUN: 32 mg/dL — ABNORMAL HIGH (ref 6–20)
Calcium, Ion: 0.98 mmol/L — ABNORMAL LOW (ref 1.15–1.40)
Chloride: 111 mmol/L (ref 101–111)
Creatinine, Ser: 3.1 mg/dL — ABNORMAL HIGH (ref 0.61–1.24)
Glucose, Bld: 108 mg/dL — ABNORMAL HIGH (ref 65–99)
HCT: 45 % (ref 39.0–52.0)
Hemoglobin: 15.3 g/dL (ref 13.0–17.0)
Potassium: 3.8 mmol/L (ref 3.5–5.1)
Sodium: 143 mmol/L (ref 135–145)
TCO2: 20 mmol/L (ref 0–100)

## 2017-04-09 LAB — CBC
HCT: 44.5 % (ref 39.0–52.0)
Hemoglobin: 14.6 g/dL (ref 13.0–17.0)
MCH: 30 pg (ref 26.0–34.0)
MCHC: 32.8 g/dL (ref 30.0–36.0)
MCV: 91.6 fL (ref 78.0–100.0)
Platelets: 221 10*3/uL (ref 150–400)
RBC: 4.86 MIL/uL (ref 4.22–5.81)
RDW: 15.2 % (ref 11.5–15.5)
WBC: 3.3 10*3/uL — ABNORMAL LOW (ref 4.0–10.5)

## 2017-04-09 LAB — BASIC METABOLIC PANEL
Anion gap: 13 (ref 5–15)
BUN: 28 mg/dL — ABNORMAL HIGH (ref 6–20)
CO2: 18 mmol/L — ABNORMAL LOW (ref 22–32)
Calcium: 7.9 mg/dL — ABNORMAL LOW (ref 8.9–10.3)
Chloride: 112 mmol/L — ABNORMAL HIGH (ref 101–111)
Creatinine, Ser: 2.97 mg/dL — ABNORMAL HIGH (ref 0.61–1.24)
GFR calc Af Amer: 26 mL/min — ABNORMAL LOW (ref >60)
GFR calc non Af Amer: 23 mL/min — ABNORMAL LOW (ref >60)
Glucose, Bld: 106 mg/dL — ABNORMAL HIGH (ref 65–99)
Potassium: 3.9 mmol/L (ref 3.5–5.1)
Sodium: 143 mmol/L (ref 135–145)

## 2017-04-09 LAB — I-STAT CG4 LACTIC ACID, ED: Lactic Acid, Venous: 2.65 mmol/L (ref 0.5–1.9)

## 2017-04-09 LAB — I-STAT TROPONIN, ED: Troponin i, poc: 0.03 ng/mL (ref 0.00–0.08)

## 2017-04-09 LAB — GLUCOSE, CAPILLARY: Glucose-Capillary: 143 mg/dL — ABNORMAL HIGH (ref 65–99)

## 2017-04-09 MED ORDER — ISOSORB DINITRATE-HYDRALAZINE 20-37.5 MG PO TABS
1.0000 | ORAL_TABLET | Freq: Three times a day (TID) | ORAL | Status: DC
  Administered 2017-04-09 – 2017-04-11 (×5): 1 via ORAL
  Filled 2017-04-09 (×6): qty 1

## 2017-04-09 MED ORDER — LORATADINE 10 MG PO TABS: 10.0000 mg | ORAL_TABLET | Freq: Every day | ORAL | Status: DC

## 2017-04-09 MED ORDER — METHYLPREDNISOLONE SODIUM SUCC 125 MG IJ SOLR
125.0000 mg | Freq: Once | INTRAMUSCULAR | Status: AC
  Administered 2017-04-09: 125 mg via INTRAVENOUS

## 2017-04-09 MED ORDER — EPINEPHRINE 0.3 MG/0.3ML IJ SOAJ
0.3000 mg | Freq: Once | INTRAMUSCULAR | Status: AC
  Administered 2017-04-09: 0.3 mg via INTRAMUSCULAR

## 2017-04-09 MED ORDER — SODIUM CHLORIDE 0.9 % IV SOLN
250.0000 mL | INTRAVENOUS | Status: DC | PRN
  Administered 2017-04-10: 250 mL via INTRAVENOUS

## 2017-04-09 MED ORDER — FAMOTIDINE IN NACL 20-0.9 MG/50ML-% IV SOLN
20.0000 mg | Freq: Two times a day (BID) | INTRAVENOUS | Status: DC
  Administered 2017-04-09 – 2017-04-11 (×4): 20 mg via INTRAVENOUS
  Filled 2017-04-09 (×7): qty 50

## 2017-04-09 MED ORDER — ALBUTEROL (5 MG/ML) CONTINUOUS INHALATION SOLN
10.0000 mg/h | INHALATION_SOLUTION | Freq: Once | RESPIRATORY_TRACT | Status: AC
  Administered 2017-04-09: 10 mg/h via RESPIRATORY_TRACT
  Filled 2017-04-09: qty 20

## 2017-04-09 MED ORDER — METHYLPREDNISOLONE SODIUM SUCC 125 MG IJ SOLR
80.0000 mg | Freq: Two times a day (BID) | INTRAMUSCULAR | Status: DC
  Administered 2017-04-10: 80 mg via INTRAVENOUS
  Filled 2017-04-09: qty 1.28
  Filled 2017-04-09: qty 2

## 2017-04-09 MED ORDER — ONDANSETRON HCL 4 MG/2ML IJ SOLN
INTRAMUSCULAR | Status: AC
  Administered 2017-04-09: 20:00:00
  Filled 2017-04-09: qty 2

## 2017-04-09 MED ORDER — LORATADINE 5 MG/5ML PO SYRP
10.0000 mg | ORAL_SOLUTION | Freq: Every day | ORAL | Status: DC
  Administered 2017-04-10 – 2017-04-11 (×3): 10 mg via ORAL
  Filled 2017-04-09 (×3): qty 10

## 2017-04-09 MED ORDER — DIPHENHYDRAMINE HCL 50 MG/ML IJ SOLN
50.0000 mg | Freq: Once | INTRAMUSCULAR | Status: AC
  Administered 2017-04-09: 50 mg via INTRAVENOUS

## 2017-04-09 MED ORDER — HEPARIN SODIUM (PORCINE) 5000 UNIT/ML IJ SOLN
5000.0000 [IU] | Freq: Three times a day (TID) | INTRAMUSCULAR | Status: DC
  Filled 2017-04-09 (×4): qty 1

## 2017-04-09 MED ORDER — SODIUM CHLORIDE 0.9 % IV BOLUS (SEPSIS)
1000.0000 mL | Freq: Once | INTRAVENOUS | Status: AC
  Administered 2017-04-09: 1000 mL via INTRAVENOUS

## 2017-04-09 MED ORDER — CARVEDILOL 25 MG PO TABS
25.0000 mg | ORAL_TABLET | Freq: Two times a day (BID) | ORAL | Status: DC
  Administered 2017-04-10 – 2017-04-11 (×3): 25 mg via ORAL
  Filled 2017-04-09 (×4): qty 1

## 2017-04-09 MED ORDER — SPIRONOLACTONE 25 MG PO TABS
25.0000 mg | ORAL_TABLET | Freq: Every day | ORAL | Status: DC
  Administered 2017-04-10 – 2017-04-11 (×2): 25 mg via ORAL
  Filled 2017-04-09 (×2): qty 1

## 2017-04-09 MED ORDER — CARVEDILOL 12.5 MG PO TABS
25.0000 mg | ORAL_TABLET | Freq: Two times a day (BID) | ORAL | Status: DC
  Filled 2017-04-09: qty 2

## 2017-04-09 MED ORDER — EPINEPHRINE PF 1 MG/ML IJ SOLN
0.5000 ug/min | INTRAVENOUS | Status: DC
  Administered 2017-04-09: 5 ug/min via INTRAVENOUS
  Filled 2017-04-09: qty 4

## 2017-04-09 MED ORDER — FAMOTIDINE IN NACL 20-0.9 MG/50ML-% IV SOLN
20.0000 mg | Freq: Once | INTRAVENOUS | Status: AC
  Administered 2017-04-09: 20 mg via INTRAVENOUS

## 2017-04-09 NOTE — ED Notes (Signed)
Pt's BP 209/131, epi drip titrated to 67mcg/min, EDP aware.

## 2017-04-09 NOTE — ED Notes (Signed)
Pt received 0.3 mg epi given PTA

## 2017-04-09 NOTE — H&P (Signed)
PULMONARY / CRITICAL CARE MEDICINE   Name: Victor Thompson MRN: [id number] DOB: [birth year]    ADMISSION DATE:  04/09/2017  CHIEF COMPLAINT:  Throat Swelling  HISTORY OF PRESENT ILLNESS:   56 y/o M with hx of angioedema (allergic vs C1inh def, prior w/u showed normal C1ih function, but has elv. tryptase in the past), CAD, ICM, Hx of Cardiac Arrest, s/p ICD insertion who presented with throat swelling after eating a [place] Gyro. He believes his prior episodes have been food-related, but he is not aware of a specific allergic reaction he has. He did self-administer an EpiPen once he symptoms developed. He did not have urticaria. He was hypertensive after the epi. His airway was patent, but critical, and he was apparently in some respiratory distress. The ED nearly intubated him, but his swelling abated with his treatment, and he was left with a natural airway. PCCM was called for ICU-level care given his still-critical airway.  PAST MEDICAL HISTORY :  He  has no past medical history on file.  PAST SURGICAL HISTORY: He  has no past surgical history on file.  Allergies  Allergen Reactions  . Ace Inhibitors Swelling  . Lidocaine Swelling    TONGUE SWELLS  . Lisinopril Swelling    TONGUE SWELLS  . Penicillins Swelling    TONGUE SWELLS Has patient had a PCN reaction causing immediate rash, facial/tongue/throat swelling, SOB or lightheadedness with hypotension: Yes Has patient had a PCN reaction causing severe rash involving mucus membranes or skin necrosis: No Has patient had a PCN reaction that required hospitalization No Has patient had a PCN reaction occurring within the last 10 years: Yes If all of the above answers are "NO", then may proceed with Cephalosporin use.     No current facility-administered medications on file prior to encounter.    No current outpatient prescriptions on file prior to encounter.    FAMILY HISTORY:  His has no family status information on file.     SOCIAL HISTORY: He  reports that he has never smoked. He has never used smokeless tobacco. He reports that he does not drink alcohol or use drugs.  REVIEW OF SYSTEMS:   Per HPI  VITAL SIGNS: BP (!) 144/83 (BP Location: Left Arm)   Pulse 81   Temp 98 F (36.7 C) (Oral)   Resp 20   SpO2 97%   HEMODYNAMICS:    VENTILATOR SETTINGS:    INTAKE / OUTPUT: No intake/output data recorded.  PHYSICAL EXAMINATION: General:  Obese man, resting, no NAD Neuro:  Awake, alert, oriented HEENT:  Grossly swollen face, lips, and tongue. Poor protrusion. Cardiovascular:  RRR Lungs:  Normal clear airway. Abdomen:  Obese Musculoskeletal:  No deformities Skin:  No rashes on visible skin  LABS:  BMET  Recent Labs Lab 04/09/17 1942  NA 143  K 3.8  CL 111  BUN 32*  CREATININE 3.10*  GLUCOSE 108*    Electrolytes No results for input(s): CALCIUM, MG, PHOS in the last 168 hours.  CBC  Recent Labs Lab 04/09/17 1942 04/09/17 2023  WBC  --  3.3*  HGB 15.3 14.6  HCT 45.0 44.5  PLT  --  221    Coag's No results for input(s): APTT, INR in the last 168 hours.  Sepsis Markers  Recent Labs Lab 04/09/17 2034  LATICACIDVEN 2.65*    ABG No results for input(s): PHART, PCO2ART, PO2ART in the last 168 hours.  Liver Enzymes No results for input(s): AST, ALT, ALKPHOS, BILITOT, ALBUMIN in the  last 168 hours.  Cardiac Enzymes No results for input(s): TROPONINI, PROBNP in the last 168 hours.  Glucose No results for input(s): GLUCAP in the last 168 hours.  Imaging No results found.   STUDIES:  None  CULTURES: None  ANTIBIOTICS: None  SIGNIFICANT EVENTS: None  LINES/TUBES: PIVs  DISCUSSION: 56 y/o man with probable allergic angioedema  ASSESSMENT / PLAN:  PULMONARY A: Threatened Airway Angioedema P:   Will treat with steroids, H1/H2 blockers ICU monitoring. Intubation if necessary, but appears to be improving with treatment.  CARDIOVASCULAR A:   Hx of ICM, cardiac arrest HTN P:  Continue Home tx Concerned for cardiac stress s/p dose of epi.  RENAL A:   Hx of renal insuf Elev lactate P:   Complicated by ICM Will monitor Will trend lactic acid. Unclear etiology.  GASTROINTESTINAL A:   Oropharyngeal edema P:   Will keep NPO to prevent aspiration. Liquid meds when possible  HEMATOLOGIC A:   No active issues P:   INFECTIOUS A:   No active issues P:    ENDOCRINE A:   No active issues P:    NEUROLOGIC A:   No active issues P:   RASS goal: 0   FAMILY  - Updates:   CRITICAL CARE Performed by: Luz Brazen   Total critical care time: 45 minutes  Critical care time was exclusive of separately billable procedures and treating other patients.  Critical care was necessary to treat or prevent imminent or life-threatening deterioration.  Critical care was time spent personally by me on the following activities: development of treatment plan with patient and/or surrogate as well as nursing, discussions with consultants, evaluation of patient's response to treatment, examination of patient, obtaining history from patient or surrogate, ordering and performing treatments and interventions, ordering and review of laboratory studies, ordering and review of radiographic studies, pulse oximetry and re-evaluation of patient's condition.    Luz Brazen, MD Pulmonary and [place] Pager: [phone]  04/09/2017, 10:49 PM

## 2017-04-09 NOTE — ED Triage Notes (Signed)
Allergic reaction after eating a gyro. Lips swollen, shortness of breath, lung sounds clear

## 2017-04-09 NOTE — ED Notes (Signed)
Pt states he is feeling better, oral swelling has improved.

## 2017-04-09 NOTE — ED Notes (Signed)
0.3 mg IM epi given, 50 mg benadryl IV, and 125mg  solumedrol IV given

## 2017-04-09 NOTE — ED Notes (Signed)
Pt c/o chest discomfort, EDP aware, EKG obtained.

## 2017-04-09 NOTE — ED Provider Notes (Signed)
[facility] DEPT Provider Note   CSN: [id number] Arrival date & time: 04/09/17  1932     History   Chief Complaint Chief Complaint  Patient presents with  . Allergic Reaction    HPI Victor Thompson is a 56 y.o. male.  The history is provided by the patient.  Allergic Reaction  Presenting symptoms: difficulty breathing, difficulty swallowing, itching and swelling   Severity:  Severe Duration: immediately PTA. Prior episodes: hx of angioedema. Context comment:  Ate a gyro prior to symptoms Relieved by:  Nothing Worsened by:  Nothing Ineffective treatments:  Epinephrine   History reviewed. No pertinent past medical history.  There are no active problems to display for this patient.   History reviewed. No pertinent surgical history.     Home Medications    Prior to Admission medications   Not on File    Family History No family history on file.  Social History Social History  Substance Use Topics  . Smoking status: Never Smoker  . Smokeless tobacco: Never Used  . Alcohol use No     Allergies   Ace inhibitors; Lidocaine; Lisinopril; and Penicillins   Review of Systems Review of Systems  Unable to perform ROS: Severe respiratory distress  HENT: Positive for trouble swallowing.   Skin: Positive for itching.     Physical Exam Updated Vital Signs BP (!) 144/83 (BP Location: Left Arm)   Pulse 81   Temp 98 F (36.7 C) (Oral)   Resp 20   SpO2 97%   Physical Exam  Constitutional: He is oriented to person, place, and time. Vital signs are normal. He appears well-developed and well-nourished. He appears toxic. He appears distressed. Face mask in place.  HENT:  Severe lip and intraoral swelling. Only able to open mouth two fingers width and unable to visualize uvula or tonsils.  Eyes: EOM are normal. Pupils are equal, round, and reactive to light.  Neck: Normal range of motion.  Cardiovascular: Normal rate, regular rhythm, normal heart sounds and  intact distal pulses.   Pulmonary/Chest: No stridor. He is in respiratory distress. He has no wheezes. He has no rales.  Abdominal: Soft. He exhibits no distension and no mass. There is no tenderness. There is no guarding.  Musculoskeletal: Normal range of motion. He exhibits edema (hand and feet).  Neurological: He is alert and oriented to person, place, and time. No cranial nerve deficit. Coordination normal.  Skin: Skin is warm and dry. No rash noted. No erythema. No pallor.  Nursing note and vitals reviewed.    ED Treatments / Results  Labs (all labs ordered are listed, but only abnormal results are displayed) Labs Reviewed  CBC - Abnormal; Notable for the following:       Result Value   WBC 3.3 (*)    All other components within normal limits  I-STAT CHEM 8, ED - Abnormal; Notable for the following:    BUN 32 (*)    Creatinine, Ser 3.10 (*)    Glucose, Bld 108 (*)    Calcium, Ion 0.98 (*)    All other components within normal limits  I-STAT CG4 LACTIC ACID, ED - Abnormal; Notable for the following:    Lactic Acid, Venous 2.65 (*)    All other components within normal limits  BASIC METABOLIC PANEL  I-STAT TROPOININ, ED    EKG  EKG Interpretation None       Radiology No results found.  Procedures Procedures (including critical care time)  Medications Ordered in ED  Medications  diphenhydrAMINE (BENADRYL) injection 50 mg (50 mg Intravenous Given 04/09/17 1942)  methylPREDNISolone sodium succinate (SOLU-MEDROL) 125 mg/2 mL injection 125 mg (125 mg Intravenous Given 04/09/17 1942)  EPINEPHrine (EPI-PEN) injection 0.3 mg (0.3 mg Intramuscular Given 04/09/17 1946)  famotidine (PEPCID) IVPB 20 mg premix (0 mg Intravenous Stopped 04/09/17 2023)  sodium chloride 0.9 % bolus 1,000 mL (0 mLs Intravenous Stopped 04/09/17 2023)  ondansetron (ZOFRAN) 4 MG/2ML injection (  Given 04/09/17 1949)  albuterol (PROVENTIL,VENTOLIN) solution continuous neb (10 mg/hr Nebulization Given 04/09/17  1955)  EPINEPHrine (EPI-PEN) injection 0.3 mg (0.3 mg Intramuscular Given 04/09/17 2003)     Initial Impression / Assessment and Plan / ED Course  I have reviewed the triage vital signs and the nursing notes.  Pertinent labs & imaging results that were available during my care of the patient were reviewed by me and considered in my medical decision making (see chart for details).     Patient is a 56 year old male with a history of ischemic cardiomyopathy with ICD in place as well as history of angioedema without clear cause at this time who presents from home with concerns of angioedema/anaphylaxis after eating a gyro. Took his home EpiPen prior to arrival. Upon arrival he is hemodynamically stable but does appear to have marked oral swelling with marked respiratory distress. He was given multiple doses of intramuscular epinephrine as well as started on epinephrine drip. He was given steroids and H1 and H2 blockers as well as fluid. Given an albuterol nebulizer. Patient's shortness of breath subsequently started improving and he was weaned down to nasal cannula oxygen and weaned off the epinephrine drip. Of note he did develop some chest pain while on the epinephrine drip but had no acute ischemic changes on EKG. Given his severity, he will be admitted to ICU for further airway observation.  Final Clinical Impressions(s) / ED Diagnoses   Final diagnoses:  Anaphylaxis, initial encounter  Angioedema, initial encounter    New Prescriptions New Prescriptions   No medications on file     Heriberto Antigua, MD 04/10/17 1759    Elnora Morrison, MD 04/11/17 [phone]

## 2017-04-10 ENCOUNTER — Inpatient Hospital Stay (HOSPITAL_COMMUNITY): Payer: BLUE CROSS/BLUE SHIELD

## 2017-04-10 LAB — BASIC METABOLIC PANEL
Anion gap: 10 (ref 5–15)
BUN: 32 mg/dL — ABNORMAL HIGH (ref 6–20)
CO2: 20 mmol/L — ABNORMAL LOW (ref 22–32)
Calcium: 7.8 mg/dL — ABNORMAL LOW (ref 8.9–10.3)
Chloride: 111 mmol/L (ref 101–111)
Creatinine, Ser: 3.13 mg/dL — ABNORMAL HIGH (ref 0.61–1.24)
GFR calc Af Amer: 25 mL/min — ABNORMAL LOW (ref >60)
GFR calc non Af Amer: 21 mL/min — ABNORMAL LOW (ref >60)
Glucose, Bld: 148 mg/dL — ABNORMAL HIGH (ref 65–99)
Potassium: 4.2 mmol/L (ref 3.5–5.1)
Sodium: 141 mmol/L (ref 135–145)

## 2017-04-10 LAB — LACTIC ACID, PLASMA
Lactic Acid, Venous: 1.9 mmol/L (ref 0.5–1.9)
Lactic Acid, Venous: 1.5 mmol/L (ref 0.5–1.9)

## 2017-04-10 LAB — CBC
HCT: 40.5 % (ref 39.0–52.0)
Hemoglobin: 13.3 g/dL (ref 13.0–17.0)
MCH: 29.7 pg (ref 26.0–34.0)
MCHC: 32.8 g/dL (ref 30.0–36.0)
MCV: 90.4 fL (ref 78.0–100.0)
Platelets: 188 10*3/uL (ref 150–400)
RBC: 4.48 MIL/uL (ref 4.22–5.81)
RDW: 15.4 % (ref 11.5–15.5)
WBC: 14.5 10*3/uL — ABNORMAL HIGH (ref 4.0–10.5)

## 2017-04-10 LAB — MAGNESIUM: Magnesium: 1.7 mg/dL (ref 1.7–2.4)

## 2017-04-10 LAB — MRSA PCR SCREENING: MRSA by PCR: NEGATIVE

## 2017-04-10 LAB — HIV ANTIBODY (ROUTINE TESTING W REFLEX): HIV Screen 4th Generation wRfx: NONREACTIVE

## 2017-04-10 LAB — PHOSPHORUS: Phosphorus: 2.6 mg/dL (ref 2.5–4.6)

## 2017-04-10 MED ORDER — METHYLPREDNISOLONE SODIUM SUCC 125 MG IJ SOLR
80.0000 mg | Freq: Two times a day (BID) | INTRAMUSCULAR | Status: AC
  Administered 2017-04-10: 80 mg via INTRAVENOUS
  Filled 2017-04-10: qty 2

## 2017-04-10 MED ORDER — ORAL CARE MOUTH RINSE
15.0000 mL | Freq: Two times a day (BID) | OROMUCOSAL | Status: DC
  Administered 2017-04-10 – 2017-04-11 (×2): 15 mL via OROMUCOSAL

## 2017-04-10 MED ORDER — PREDNISONE 20 MG PO TABS
40.0000 mg | ORAL_TABLET | Freq: Every day | ORAL | Status: AC
  Administered 2017-04-11: 40 mg via ORAL
  Filled 2017-04-10: qty 2

## 2017-04-10 MED ORDER — TRAMADOL HCL 50 MG PO TABS
50.0000 mg | ORAL_TABLET | Freq: Three times a day (TID) | ORAL | Status: DC | PRN
  Administered 2017-04-10 (×2): 50 mg via ORAL
  Filled 2017-04-10 (×2): qty 1

## 2017-04-10 NOTE — Progress Notes (Signed)
PULMONARY / CRITICAL CARE MEDICINE   Name: Victor Thompson MRN: [id number] DOB: [birth year]    ADMISSION DATE:  04/09/2017  CHIEF COMPLAINT:  Throat Swelling  HISTORY OF PRESENT ILLNESS:   56 y/o M with hx of angioedema (allergic vs C1inh def, prior w/u showed normal C1ih function, but has elv. tryptase in the past), CAD, ICM, Hx of Cardiac Arrest, s/p ICD insertion who presented with throat swelling after eating a [place] Gyro. He believes his prior episodes have been food-related, but he is not aware of a specific allergic reaction he has. He did self-administer an EpiPen once he symptoms developed. He did not have urticaria. He was hypertensive after the epi. His airway was patent, but critical, and he was apparently in some respiratory distress. The ED nearly intubated him, but his swelling abated with his treatment, and he was left with a natural airway. PCCM was called for ICU-level care given his still-critical airway.   SUBJECTIVE :  He is significantly improved.  Denies SOB, tingling, difficulty swallowing.  (-) subjective complaints Pt received 3 epi shots with anaphylaxis < 12 hrs ago.      VITAL SIGNS: BP (!) 145/104   Pulse 81   Temp 98 F (36.7 C) (Oral)   Resp (!) 26   Ht 5\' 7"  (1.702 m)   Wt 131.8 kg (290 lb 9.1 oz)   SpO2 99%   BMI 45.51 kg/m   HEMODYNAMICS:    VENTILATOR SETTINGS:    INTAKE / OUTPUT: I/O last 3 completed shifts: In: 110 [I.V.:60; IV Piggyback:50] Out: 300 [Urine:300]  PHYSICAL EXAMINATION: General:  Obese man, resting, no NAD Neuro:  Awake, alert, oriented HEENT:  (-) NVD. Tongue not swollen. NV not prominent Cardiovascular:  RRR Lungs:  Normal clear airway. Abdomen:  Obese Musculoskeletal:  No deformities Skin:  No rashes on visible skin  LABS:  BMET  Recent Labs Lab 04/09/17 1942 04/09/17 2023 04/10/17 0504  NA 143 143 141  K 3.8 3.9 4.2  CL 111 112* 111  CO2  --  18* 20*  BUN 32* 28* 32*  CREATININE 3.10* 2.97* 3.13*   GLUCOSE 108* 106* 148*    Electrolytes  Recent Labs Lab 04/09/17 2023 04/10/17 0504  CALCIUM 7.9* 7.8*  MG  --  1.7  PHOS  --  2.6    CBC  Recent Labs Lab 04/09/17 1942 04/09/17 2023 04/10/17 0504  WBC  --  3.3* 14.5*  HGB 15.3 14.6 13.3  HCT 45.0 44.5 40.5  PLT  --  221 188    Coag's No results for input(s): APTT, INR in the last 168 hours.  Sepsis Markers  Recent Labs Lab 04/09/17 2034 04/10/17 0225 04/10/17 0504  LATICACIDVEN 2.65* 1.9 1.5    ABG No results for input(s): PHART, PCO2ART, PO2ART in the last 168 hours.  Liver Enzymes No results for input(s): AST, ALT, ALKPHOS, BILITOT, ALBUMIN in the last 168 hours.  Cardiac Enzymes No results for input(s): TROPONINI, PROBNP in the last 168 hours.  Glucose  Recent Labs Lab 04/09/17 2340  GLUCAP 143*    Imaging No results found.   STUDIES:  None  CULTURES: None  ANTIBIOTICS: None  SIGNIFICANT EVENTS: None  LINES/TUBES: PIVs  DISCUSSION: 57 y/o man with probable allergic angioedema  ASSESSMENT / PLAN:  PULMONARY A: S/P Angioedema, from food intake ([place] sandwich) P:   Clinically improved.  Since he got 3 epi shots < 12 hrs ago, plan to see how he does overnight.  If stable,  anticipate d/c in am.  Will taper off steroids. Will give IV today and PO tomorrow x 1 dose.  Cont Pepcid. Cont benadryl prn.    CARDIOVASCULAR A:  Hx of ICM, cardiac arrest HTN P:  Continue Home cardiac meds   RENAL A:   AKI CKD, unsure severity. Baseline not known.  Elev lactate, resolved P:   Will monitor   GASTROINTESTINAL A:   No issues. Protecting airway.  P:   Renal cardiac diet   HEMATOLOGIC A:   No active issues P:   INFECTIOUS A:   No active issues P:    ENDOCRINE A:   No active issues P:    NEUROLOGIC A:   No active issues P:   RASS goal: 0   FAMILY  - Updates: pt and girlfriend updated at bedside. Transfer to telemetry. If stable in am, d/c. Need  to determine if creat is stable in am.   [place], MD 04/10/2017, 1:33 PM [facility] Pager [phone] After 3 pm or if no answer, call [phone]

## 2017-04-10 NOTE — Progress Notes (Signed)
Patient started on CPAP at 8 cmH2O and is tolerating well. 2l O2 bleed with patient SATs 99%, Hr 78, RR 18. Patient will call if any further assistance needed.

## 2017-04-11 DIAGNOSIS — I10 Essential (primary) hypertension: Secondary | ICD-10-CM

## 2017-04-11 DIAGNOSIS — T783XXD Angioneurotic edema, subsequent encounter: Secondary | ICD-10-CM

## 2017-04-11 DIAGNOSIS — R0902 Hypoxemia: Secondary | ICD-10-CM

## 2017-04-11 DIAGNOSIS — T783XXA Angioneurotic edema, initial encounter: Secondary | ICD-10-CM

## 2017-04-11 LAB — CBC
HCT: 37.6 % — ABNORMAL LOW (ref 39.0–52.0)
Hemoglobin: 12.3 g/dL — ABNORMAL LOW (ref 13.0–17.0)
MCH: 29.7 pg (ref 26.0–34.0)
MCHC: 32.7 g/dL (ref 30.0–36.0)
MCV: 90.8 fL (ref 78.0–100.0)
Platelets: 198 10*3/uL (ref 150–400)
RBC: 4.14 MIL/uL — ABNORMAL LOW (ref 4.22–5.81)
RDW: 15.7 % — ABNORMAL HIGH (ref 11.5–15.5)
WBC: 14.9 10*3/uL — ABNORMAL HIGH (ref 4.0–10.5)

## 2017-04-11 LAB — BASIC METABOLIC PANEL
Anion gap: 7 (ref 5–15)
BUN: 43 mg/dL — ABNORMAL HIGH (ref 6–20)
CO2: 21 mmol/L — ABNORMAL LOW (ref 22–32)
Calcium: 7.9 mg/dL — ABNORMAL LOW (ref 8.9–10.3)
Chloride: 109 mmol/L (ref 101–111)
Creatinine, Ser: 3.44 mg/dL — ABNORMAL HIGH (ref 0.61–1.24)
GFR calc Af Amer: 22 mL/min — ABNORMAL LOW (ref >60)
GFR calc non Af Amer: 19 mL/min — ABNORMAL LOW (ref >60)
Glucose, Bld: 138 mg/dL — ABNORMAL HIGH (ref 65–99)
Potassium: 4.8 mmol/L (ref 3.5–5.1)
Sodium: 137 mmol/L (ref 135–145)

## 2017-04-11 LAB — GLUCOSE, CAPILLARY: Glucose-Capillary: 163 mg/dL — ABNORMAL HIGH (ref 65–99)

## 2017-04-11 NOTE — Discharge Summary (Signed)
Physician Discharge Summary  Patient ID: Victor Thompson MRN: [id number] DOB/AGE: 1961-11-14 56 y.o.  Admit date: 04/09/2017 Discharge date: 04/11/2017    Discharge Diagnoses:  Angioedema - presumed due to food intake ([place] gyro).  Resolved after epi pen x 3. Hx OSA - on nocturnal CPAP. Hx HTN, CHF, CAD, ICM, cardiac arrest s/p AICD. AoCKD.                                                                      DISCHARGE PLAN BY DIAGNOSIS    Angioedema - presumed due to food intake ([place] gyro).  Resolved after epi pen x 3. Hx OSA - on nocturnal CPAP. Plan: No further interventions required. Caution with new foods. Continue to keep epi-pen on hand and use as needed. Continue nocturnal CPAP.  Hx HTN, CHF, CAD, ICM, cardiac arrest s/p AICD. Plan: Continue preadmission amlodipine, carvedilol, bidil, atorvastatin, nitro PRN, spironolactone.  AoCKD. Plan: Follow up with PCP scheduled for 04/22/17 - please have BMP drawn to evaluate SCr.                 DISCHARGE SUMMARY   Victor Thompson is a 56 y.o. y/o male with a PMH of OSA, angioedema (allergic vs C1inh deficiency, prior w/u showed normal C1inh function but he has elevated tryptase in the past), obesity, HTN, CHF (echo from Dec 2016 with EF 45-50%, G1DD), CAD, ICM, cardiac arrest s/p AICD.  He was admitted on 5/5 with throat swelling after eating a [place] gyro.  He self administered his epi-pen as soon as symptoms developed and called EMS.  He received an additional 2 rounds from epi-pen and upon arrival to ED, was much improved.  Given concern for deterioration, PCCM was called for admit to ICU.  He was treated with steroids, benadryl, pepcid.  He responded extremely well and was transferred out of ICU 5/6.  On 5/7, he was deemed medically stable and was cleared for discharge home.  He has follow up scheduled with PCP (Dr. Adrian Blackwater) on 04/22/17 where he will have BMP drawn to evaluate SCr.            SIGNIFICANT DIAGNOSTIC  STUDIES CXR 5/6 > atelectasis.  SIGNIFICANT EVENTS 5/5 > admit. 5/7 > discharge.   Discharge Exam: General: Adult male, resting comfortably, in NAD. Neuro: A&O x 3, non-focal.  HEENT: [place]/AT. PERRL, sclerae anicteric. Cardiovascular: RRR, no M/R/G.  Lungs: Respirations even and unlabored.  CTA bilaterally, No W/R/R. Abdomen: Obese, BS x 4, soft, NT/ND.  Musculoskeletal: No gross deformities, no edema.  Skin: Intact, warm, no rashes.   Vitals:   04/10/17 2123 04/10/17 2316 04/11/17 0536 04/11/17 0920  BP: (!) 142/62  128/64 (!) 165/103  Pulse: 87 90 80 87  Resp: 18 (!) 22 18   Temp: 98.1 F (36.7 C)  97.6 F (36.4 C)   TempSrc: Oral  Oral   SpO2: 97% 99% 99%   Weight:      Height:         Discharge Labs  BMET  Recent Labs Lab 04/09/17 1942 04/09/17 2023 04/10/17 0504 04/11/17 0659  NA 143 143 141 137  K 3.8 3.9 4.2 4.8  CL 111 112* 111 109  CO2  --  18* 20* 21*  GLUCOSE 108* 106* 148* 138*  BUN 32* 28* 32* 43*  CREATININE 3.10* 2.97* 3.13* 3.44*  CALCIUM  --  7.9* 7.8* 7.9*  MG  --   --  1.7  --   PHOS  --   --  2.6  --     CBC  Recent Labs Lab 04/09/17 2023 04/10/17 0504 04/11/17 0659  HGB 14.6 13.3 12.3*  HCT 44.5 40.5 37.6*  WBC 3.3* 14.5* 14.9*  PLT 221 188 198    Anti-Coagulation No results for input(s): INR in the last 168 hours.  Discharge Instructions    Diet - low sodium heart healthy    Complete by:  As directed        Follow-up Information    Boykin Nearing, MD Follow up on 04/22/2017.   Specialty:  Family Medicine Why:  Your appointment is at [facility] (you will have a BMP drawn at this appointment). Contact information: [place] [address] [phone]            Allergies as of 04/11/2017      Reactions   Ace Inhibitors Swelling   Lidocaine Swelling   TONGUE SWELLS   Lisinopril Swelling   TONGUE SWELLS   Penicillins Swelling   TONGUE SWELLS Has patient had a PCN reaction causing immediate  rash, facial/tongue/throat swelling, SOB or lightheadedness with hypotension: Yes Has patient had a PCN reaction causing severe rash involving mucus membranes or skin necrosis: No Has patient had a PCN reaction that required hospitalization No Has patient had a PCN reaction occurring within the last 10 years: Yes If all of the above answers are "NO", then may proceed with Cephalosporin use.      Medication List    TAKE these medications   amLODipine 5 MG tablet Commonly known as:  NORVASC Take 5 mg by mouth daily.   atorvastatin 40 MG tablet Commonly known as:  LIPITOR Take 40 mg by mouth daily at 6 PM.   carvedilol 25 MG tablet Commonly known as:  COREG Take 25 mg by mouth 2 (two) times daily with a meal.   isosorbide-hydrALAZINE 20-37.5 MG tablet Commonly known as:  BIDIL Take 1 tablet by mouth 3 (three) times daily.   nitroGLYCERIN 0.4 MG SL tablet Commonly known as:  NITROSTAT Place 0.4 mg under the tongue every 5 (five) minutes as needed for chest pain.   potassium chloride 10 MEQ tablet Commonly known as:  K-DUR,KLOR-CON Take 10 mEq by mouth daily.   spironolactone 25 MG tablet Commonly known as:  ALDACTONE Take 25 mg by mouth daily.   traMADol 50 MG tablet Commonly known as:  ULTRAM Take 50 mg by mouth every 8 (eight) hours as needed for moderate pain.        Disposition: Home.  Discharged Condition: Victor Thompson has met maximum benefit of inpatient care and is medically stable and cleared for discharge.  Patient is pending follow up as above.      Time spent on disposition:  Greater than 35 minutes.   Montey Hora, [facility] Pgr: [phone]  or [phone]  Attending Note:  45 minutes spent in discharge related activities  Patient seen and examined, agree with above note.  I dictated the care and orders written for this patient under my direction.  Rush Farmer, [place]

## 2017-04-11 NOTE — Progress Notes (Signed)
Victor Thompson to be D/C'd Home per MD order.  Discussed with the patient and all questions fully answered.  VSS, Skin clean, dry and intact without evidence of skin break down, no evidence of skin tears noted. IV catheter discontinued intact. Site without signs and symptoms of complications. Dressing and pressure applied.  An After Visit Summary was printed and given to the patient.   D/c education completed with patient/family including follow up instructions, medication list, d/c activities limitations if indicated, with other d/c instructions as indicated by MD - patient able to verbalize understanding, all questions fully answered.   Patient instructed to return to ED, call 911, or call MD for any changes in condition.   Patient escorted via [place], and D/C home via private auto.  Victor Thompson 04/11/2017 2:45 PM

## 2017-04-12 ENCOUNTER — Encounter (HOSPITAL_COMMUNITY): Payer: Self-pay | Admitting: Emergency Medicine

## 2017-04-20 ENCOUNTER — Other Ambulatory Visit: Payer: Self-pay | Admitting: Family Medicine

## 2017-04-22 ENCOUNTER — Ambulatory Visit: Payer: BLUE CROSS/BLUE SHIELD | Attending: Family Medicine | Admitting: Family Medicine

## 2017-04-22 ENCOUNTER — Encounter: Payer: Self-pay | Admitting: Family Medicine

## 2017-04-22 VITALS — BP 164/90 | HR 74 | Temp 98.0°F | Wt 287.6 lb

## 2017-04-22 DIAGNOSIS — T783XXS Angioneurotic edema, sequela: Secondary | ICD-10-CM | POA: Diagnosis not present

## 2017-04-22 DIAGNOSIS — N183 Chronic kidney disease, stage 3 unspecified: Secondary | ICD-10-CM

## 2017-04-22 DIAGNOSIS — I10 Essential (primary) hypertension: Secondary | ICD-10-CM

## 2017-04-22 DIAGNOSIS — I2510 Atherosclerotic heart disease of native coronary artery without angina pectoris: Secondary | ICD-10-CM | POA: Diagnosis not present

## 2017-04-22 DIAGNOSIS — I5042 Chronic combined systolic (congestive) and diastolic (congestive) heart failure: Secondary | ICD-10-CM

## 2017-04-22 DIAGNOSIS — I130 Hypertensive heart and chronic kidney disease with heart failure and stage 1 through stage 4 chronic kidney disease, or unspecified chronic kidney disease: Secondary | ICD-10-CM | POA: Diagnosis present

## 2017-04-22 MED ORDER — EPINEPHRINE 0.3 MG/0.3ML IJ SOAJ: 0.3000 mg | Freq: Once | INTRAMUSCULAR | 2 refills | Status: DC

## 2017-04-22 MED ORDER — ISOSORB DINITRATE-HYDRALAZINE 20-37.5 MG PO TABS: 1.0000 | ORAL_TABLET | Freq: Three times a day (TID) | ORAL | 11 refills | Status: DC

## 2017-04-22 MED ORDER — CARVEDILOL 25 MG PO TABS: 25.0000 mg | ORAL_TABLET | Freq: Two times a day (BID) | ORAL | 11 refills | Status: DC

## 2017-04-22 MED ORDER — AMLODIPINE BESYLATE 10 MG PO TABS: 10.0000 mg | ORAL_TABLET | Freq: Every day | ORAL | 11 refills | Status: DC

## 2017-04-22 MED ORDER — SPIRONOLACTONE 25 MG PO TABS: 25.0000 mg | ORAL_TABLET | Freq: Every day | ORAL | 2 refills | Status: DC

## 2017-04-22 MED FILL — BIDIL TABLET: 20-37.5 | 30 days supply | Qty: 90 | Fill #0

## 2017-04-22 MED FILL — CARVEDILOL 25 MG TABLET: 25 | 30 days supply | Qty: 60 | Fill #0

## 2017-04-22 MED FILL — SPIRONOLACTONE 25 MG TABLET: 25 | 30 days supply | Qty: 30 | Fill #0

## 2017-04-22 MED FILL — ?AMLODIPINE BESYLATE 10 MG: 10 | 30 days supply | Qty: 30 | Fill #0

## 2017-04-22 NOTE — Progress Notes (Signed)
Subjective:  Patient ID: Victor Thompson, male    DOB: 1961/11/16  Age: 56 y.o. MRN: [id number]  CC: Hospitalization Follow-up   HPI [name] has CKD stage 3, CAD, history of V fib with cardiac arrest, combined CHF he presents for   1. Hospitalization follow up: he was hospitalized from 5/5-04/11/17 for angioedema following eating a [place] gyro from [employer]. His swelling was in his throat. He self administered an epi pen and called EMS. He was given to more round from epi-pen. He admitted to ICU for monitoring. He received additional steroids, benadryl and pepcid. He was transferred out of the ICU on 5/6 and discharged to home on 5/7. He has known CKD and his Cr was 3.44 on 04/11/17 with GFR of 22.   He reports not recurrent swelling of lips or tongue.   2. CKD : stage 3 at baseline. Recent labs with stage 4 GFR.  He reports shortness of breath at work with climbing steps. He reports shortness of breath started with weight gain. He started walking 30 minutes a day and joined with [employer]. He eats out often. He reports he does not add salt to his food. He reports compliance with antihypertensives. He did not bring his pill bottles today and he does not know if he takes 5 or 10 mg of amlodipine.   03/10/2010 renal ultrasound:  IMPRESSION: Slight increased echogenicity of renal parenchyma is seen with decreased differentiation between cortex and medulla.  This may be seen in medical renal disease.  No hydronephrosis, mass, or other renal abnormality is seen.  No bladder lesion is demonstrated.  3. Shoulder pains: he reports 4-6/10 pains in shoulder that does not interfere with range of motion or use.  He first reported pain was in R shoulder. When I reminded him that at his last visit he complained of L shoulder pain he changed his story and complained of L sided pain.  He request tramadol refill. He does not take NSAID due to CKD. He has not tried tylenol as he thought it was also a prohibited  medication. He reports HA when he takes imdur   Social History  Substance Use Topics  . Smoking status: Never Smoker  . Smokeless tobacco: Never Used  . Alcohol use No    Outpatient Medications Prior to Visit  Medication Sig Dispense Refill  . albuterol (PROVENTIL HFA;VENTOLIN HFA) 108 (90 Base) MCG/ACT inhaler Inhale 2 puffs into the lungs every 4 (four) hours as needed for wheezing or shortness of breath. 54 g 3  . amLODipine (NORVASC) 10 MG tablet TAKE 1 TABLET BY MOUTH DAILY. 30 tablet 0  . amLODipine (NORVASC) 5 MG tablet Take 5 mg by mouth daily.    Marland Kitchen aspirin EC 81 MG tablet Take 1 tablet (81 mg total) by mouth daily. 30 tablet 5  . atorvastatin (LIPITOR) 40 MG tablet TAKE 1 TABLET BY MOUTH AT BEDTIME. 30 tablet 2  . atorvastatin (LIPITOR) 40 MG tablet Take 40 mg by mouth daily at 6 PM.    . beclomethasone (QVAR) 40 MCG/ACT inhaler Inhale 1 puff into the lungs 2 (two) times daily.    Marland Kitchen BIDIL 20-37.5 MG tablet TAKE 1 TABLET BY MOUTH 3 TIMES DAILY. 90 tablet 2  . carvedilol (COREG) 25 MG tablet Take 1 tablet (25 mg total) by mouth 2 (two) times daily with a meal. 60 tablet 3  . carvedilol (COREG) 25 MG tablet Take 25 mg by mouth 2 (two) times daily with a meal.    .  EPINEPHrine 0.3 mg/0.3 mL IJ SOAJ injection Inject 0.3 mLs (0.3 mg total) into the muscle once. 1 Device 3  . famotidine (PEPCID) 20 MG tablet Take 1 tablet (20 mg total) by mouth 2 (two) times daily. (Patient not taking: Reported on 08/14/2016) 60 tablet 5  . isosorbide-hydrALAZINE (BIDIL) 20-37.5 MG tablet Take 1 tablet by mouth 3 (three) times daily.    Marland Kitchen loratadine (CLARITIN) 10 MG tablet Take 1 tablet (10 mg total) by mouth daily as needed for allergies. 30 tablet 5  . montelukast (SINGULAIR) 10 MG tablet Take 10 mg by mouth at bedtime.     . nitroGLYCERIN (NITROSTAT) 0.4 MG SL tablet Place 1 tablet (0.4 mg total) under the tongue every 5 (five) minutes x 3 doses as needed for chest pain. 25 tablet 12  . nitroGLYCERIN  (NITROSTAT) 0.4 MG SL tablet Place 0.4 mg under the tongue every 5 (five) minutes as needed for chest pain.    . potassium chloride (K-DUR) 10 MEQ tablet Take 1 tablet by mouth daily.   2  . potassium chloride (K-DUR,KLOR-CON) 10 MEQ tablet Take 10 mEq by mouth daily.    . predniSONE (DELTASONE) 20 MG tablet 2 tabs po daily x 4 days 8 tablet 0  . PRESCRIPTION MEDICATION Inhale into the lungs at bedtime. CPAP    . spironolactone (ALDACTONE) 25 MG tablet Take 1 tablet (25 mg total) by mouth daily. 30 tablet 3  . spironolactone (ALDACTONE) 25 MG tablet Take 25 mg by mouth daily.    . traMADol (ULTRAM) 50 MG tablet TAKE 1 TABLET BY MOUTH EVERY 8 HOURS AS NEEDED 60 tablet 0  . traMADol (ULTRAM) 50 MG tablet Take 50 mg by mouth every 8 (eight) hours as needed for moderate pain.     No facility-administered medications prior to visit.     ROS Review of Systems  Constitutional: Negative for chills, fatigue, fever and unexpected weight change.  Eyes: Negative for visual disturbance.  Respiratory: Positive for shortness of breath. Negative for cough.   Cardiovascular: Negative for chest pain, palpitations and leg swelling.  Gastrointestinal: Negative for abdominal pain, blood in stool, constipation, diarrhea, nausea and vomiting.  Endocrine: Negative for polydipsia, polyphagia and polyuria.  Musculoskeletal: Negative for arthralgias, back pain, gait problem, myalgias and neck pain.  Skin: Negative for rash.  Allergic/Immunologic: Negative for immunocompromised state.  Hematological: Negative for adenopathy. Does not bruise/bleed easily.  Psychiatric/Behavioral: Negative for dysphoric mood, sleep disturbance and suicidal ideas. The patient is not nervous/anxious.     Objective:  BP (!) 164/90   Pulse 74   Temp 98 F (36.7 C) (Oral)   Wt 287 lb 9.6 oz (130.5 kg)   SpO2 100%   BMI 45.04 kg/m   BP/Weight 04/22/2017 12/14/4172 0/07/1447  Systolic BP 185 631 -  Diastolic BP 90 497 -  Wt. (Lbs)  287.6 - 290.57  BMI 45.04 - 45.51    Physical Exam  Constitutional: He appears well-developed and well-nourished. No distress.  Obese male   HENT:  Head: Normocephalic and atraumatic.  Mouth/Throat: Oropharynx is clear and moist and mucous membranes are normal.  Neck: Normal range of motion. Neck supple.  Cardiovascular: Normal rate, regular rhythm, normal heart sounds and intact distal pulses.   Pulmonary/Chest: Effort normal and breath sounds normal.  Musculoskeletal: He exhibits no edema.  Neurological: He is alert.  Skin: Skin is warm and dry. No rash noted. No erythema.  Psychiatric: He has a normal mood and affect.  Chemistry      Component Value Date/Time   NA 146 (H) 04/22/2017 1706   K 4.6 04/22/2017 1706   CL 108 (H) 04/22/2017 1706   CO2 20 04/22/2017 1706   BUN 28 (H) 04/22/2017 1706   CREATININE 2.84 (H) 04/22/2017 1706   CREATININE 1.97 (H) 03/15/2016 1223      Component Value Date/Time   CALCIUM 8.2 (L) 04/22/2017 1706   ALKPHOS 131 (H) 03/04/2016 1653   AST 30 03/04/2016 1653   ALT 23 03/04/2016 1653   BILITOT 0.7 03/04/2016 1653       Assessment & Plan:  Lot was seen today for hospitalization follow-up.  Diagnoses and all orders for this visit:  Chronic combined systolic and diastolic CHF (congestive heart failure) (HCC) -     isosorbide-hydrALAZINE (BIDIL) 20-37.5 MG tablet; Take 1 tablet by mouth 3 (three) times daily. -     carvedilol (COREG) 25 MG tablet; Take 1 tablet (25 mg total) by mouth 2 (two) times daily with a meal. -     spironolactone (ALDACTONE) 25 MG tablet; Take 1 tablet (25 mg total) by mouth daily.  CKD (chronic kidney disease) stage 3, GFR 30-59 ml/min -     Cancel: BASIC METABOLIC PANEL WITH GFR -     US Renal; Future -     BMP8+EGFR -     Ambulatory referral to Nephrology  Essential hypertension -     amLODipine (NORVASC) 10 MG tablet; Take 1 tablet (10 mg total) by mouth daily. -     isosorbide-hydrALAZINE (BIDIL)  20-37.5 MG tablet; Take 1 tablet by mouth 3 (three) times daily. -     carvedilol (COREG) 25 MG tablet; Take 1 tablet (25 mg total) by mouth 2 (two) times daily with a meal. -     spironolactone (ALDACTONE) 25 MG tablet; Take 1 tablet (25 mg total) by mouth daily. -     Cancel: BASIC METABOLIC PANEL WITH GFR -     BMP8+EGFR  Angioedema, sequela -     EPINEPHrine 0.3 mg/0.3 mL IJ SOAJ injection; Inject 0.3 mLs (0.3 mg total) into the muscle once.   There are no diagnoses linked to this encounter.  No orders of the defined types were placed in this encounter.   Follow-up: Return in about 3 weeks (around 05/13/2017) for BP check .   Boykin Nearing MD

## 2017-04-22 NOTE — Patient Instructions (Addendum)
Victor Thompson was seen today for hospitalization follow-up.  Diagnoses and all orders for this visit:  Chronic combined systolic and diastolic CHF (congestive heart failure) (HCC) -     isosorbide-hydrALAZINE (BIDIL) 20-37.5 MG tablet; Take 1 tablet by mouth 3 (three) times daily. -     carvedilol (COREG) 25 MG tablet; Take 1 tablet (25 mg total) by mouth 2 (two) times daily with a meal. -     spironolactone (ALDACTONE) 25 MG tablet; Take 1 tablet (25 mg total) by mouth daily.  CKD (chronic kidney disease) stage 3, GFR 30-59 ml/min -     BASIC METABOLIC PANEL WITH GFR -     US Renal; Future  Essential hypertension -     amLODipine (NORVASC) 10 MG tablet; Take 1 tablet (10 mg total) by mouth daily. -     isosorbide-hydrALAZINE (BIDIL) 20-37.5 MG tablet; Take 1 tablet by mouth 3 (three) times daily. -     carvedilol (COREG) 25 MG tablet; Take 1 tablet (25 mg total) by mouth 2 (two) times daily with a meal. -     spironolactone (ALDACTONE) 25 MG tablet; Take 1 tablet (25 mg total) by mouth daily. -     BASIC METABOLIC PANEL WITH GFR  Angioedema, sequela -     EPINEPHrine 0.3 mg/0.3 mL IJ SOAJ injection; Inject 0.3 mLs (0.3 mg total) into the muscle once.  do not take over the counter NSAIDs like aleve, ibuprogen etc You can take OTC tylenol [id number] mg up to 3 times daily for pain or headache  F/u in 3 weeks for BP check with clinical pharmacologist  F/u with me in 6 weeks for HTN  Dr. Adrian Blackwater

## 2017-04-23 LAB — BMP8+EGFR
BUN/Creatinine Ratio: 10 (ref 9–20)
BUN: 28 mg/dL — ABNORMAL HIGH (ref 6–24)
CO2: 20 mmol/L (ref 18–29)
Calcium: 8.2 mg/dL — ABNORMAL LOW (ref 8.7–10.2)
Chloride: 108 mmol/L — ABNORMAL HIGH (ref 96–106)
Creatinine, Ser: 2.84 mg/dL — ABNORMAL HIGH (ref 0.76–1.27)
GFR calc Af Amer: 28 mL/min/{1.73_m2} — ABNORMAL LOW (ref >59)
GFR calc non Af Amer: 24 mL/min/{1.73_m2} — ABNORMAL LOW (ref >59)
Glucose: 97 mg/dL (ref 65–99)
Potassium: 4.6 mmol/L (ref 3.5–5.2)
Sodium: 146 mmol/L — ABNORMAL HIGH (ref 134–144)

## 2017-04-24 NOTE — Assessment & Plan Note (Addendum)
A: uncontrolled HTN in obese patient with CKD P: Refilled medications norvasc 10 mg Stressed low salt diet, weight loss and medication compliance to improve BP, maintain renal function of avoid dialysis Avoid NSAIDs Tylenol advised for mild to moderate headache or arthralgias

## 2017-04-24 NOTE — Assessment & Plan Note (Signed)
A: CKD stage 3 at baseline but has progressed to stage 4 in setting of HTN P: Renal ultrasound Renal referral Stressed importance of BP control. Med compliance, NSAID avoidance, weight loss

## 2017-04-24 NOTE — Assessment & Plan Note (Signed)
Resolved Refilled epi pen

## 2017-04-25 ENCOUNTER — Other Ambulatory Visit: Payer: Self-pay | Admitting: Family Medicine

## 2017-04-25 DIAGNOSIS — K219 Gastro-esophageal reflux disease without esophagitis: Secondary | ICD-10-CM

## 2017-04-27 ENCOUNTER — Ambulatory Visit (HOSPITAL_COMMUNITY): Payer: BLUE CROSS/BLUE SHIELD

## 2017-05-04 ENCOUNTER — Telehealth: Payer: Self-pay

## 2017-05-04 NOTE — Telephone Encounter (Signed)
Pt was called and a VM was left informing pt to return phone call for lab results.

## 2017-05-11 ENCOUNTER — Other Ambulatory Visit: Payer: Self-pay | Admitting: Family Medicine

## 2017-05-11 DIAGNOSIS — K219 Gastro-esophageal reflux disease without esophagitis: Secondary | ICD-10-CM

## 2017-05-19 MED FILL — EPIPEN 2-PAK 0.3 MG AUTO-IN: 0.3 | 30 days supply | Qty: 2 | Fill #0

## 2017-05-23 MED FILL — EPIPEN 2-PAK 0.3 MG AUTO-IN: 0.3 | 30 days supply | Qty: 2 | Fill #1

## 2017-05-25 ENCOUNTER — Telehealth: Payer: Self-pay | Admitting: Family Medicine

## 2017-05-25 NOTE — Telephone Encounter (Signed)
Please inform patient that both doses were on his med list at his last OV. When questioned he stated he did not know if he took 5 mg or 10 mg.  Since his BP was elevated and he has chronic kidney disease. The 10 mg dose was refilled to reduce his BP and prevent worsening of chronic kidney disease which if unchecked will lead to kidney failure and dialysis.

## 2017-05-25 NOTE — Telephone Encounter (Signed)
Pt calling frustrated because his amlodipine was increased from 5mg  dosage to 10 mg dosage. Pt states that he was not made aware of the change in his prescription and would like CMA [place] to call him to explain why his dosage was increased. Please f/u with pt.

## 2017-05-27 NOTE — Telephone Encounter (Signed)
Pt was called and informed of reason for medication change.

## 2017-06-01 MED FILL — BIDIL TABLET: 20-37.5 | 30 days supply | Qty: 90 | Fill #1

## 2017-06-01 MED FILL — CARVEDILOL 25 MG TABLET: 25 | 30 days supply | Qty: 60 | Fill #1

## 2017-06-01 MED FILL — POTASSIUM CL 10 MEQ TAB SA: 10 | 30 days supply | Qty: 30 | Fill #5

## 2017-06-01 MED FILL — SPIRONOLACTONE 25 MG TABLET: 25 | 30 days supply | Qty: 30 | Fill #1

## 2017-06-01 MED FILL — ?AMLODIPINE BESYLATE 10 MG: 10 | 30 days supply | Qty: 30 | Fill #1

## 2017-06-01 MED FILL — ?ATORVASTATIN 40MG TABLET: 40 | 30 days supply | Qty: 30 | Fill #1

## 2017-06-16 ENCOUNTER — Other Ambulatory Visit: Payer: Self-pay | Admitting: Family Medicine

## 2017-06-16 DIAGNOSIS — K219 Gastro-esophageal reflux disease without esophagitis: Secondary | ICD-10-CM

## 2017-07-04 ENCOUNTER — Other Ambulatory Visit: Payer: Self-pay | Admitting: Family Medicine

## 2017-07-04 DIAGNOSIS — K219 Gastro-esophageal reflux disease without esophagitis: Secondary | ICD-10-CM

## 2017-08-01 MED FILL — CARVEDILOL 25 MG TABLET: 25 | 30 days supply | Qty: 60 | Fill #2

## 2017-08-01 MED FILL — AMLODIPINE BESYLATE 10 MG T: 10 | 30 days supply | Qty: 30 | Fill #2

## 2017-08-01 MED FILL — TEKTURNA 150 MG TABLET: 150 | 30 days supply | Qty: 30 | Fill #0

## 2017-08-01 MED FILL — ATORVASTATIN 40 MG TABLET: 40 | 30 days supply | Qty: 30 | Fill #2

## 2017-08-01 MED FILL — BIDIL TABLET: 20-37.5 | 30 days supply | Qty: 90 | Fill #2

## 2017-08-01 MED FILL — POTASSIUM CL 10 MEQ TAB SA: 10 | 30 days supply | Qty: 30 | Fill #6

## 2017-08-01 MED FILL — SPIRONOLACTONE 25 MG TABLET: 25 | 30 days supply | Qty: 30 | Fill #2

## 2017-09-19 MED FILL — TEKTURNA 150 MG TABLET: 150 | 30 days supply | Qty: 30 | Fill #1

## 2017-10-07 MED FILL — CARVEDILOL 25 MG TABLET: 25 | 30 days supply | Qty: 60 | Fill #3

## 2017-10-07 MED FILL — AMLODIPINE BESYLATE 10 MG T: 10 | 30 days supply | Qty: 30 | Fill #3

## 2017-10-10 ENCOUNTER — Telehealth: Payer: Self-pay | Admitting: Family Medicine

## 2017-10-10 DIAGNOSIS — I2510 Atherosclerotic heart disease of native coronary artery without angina pectoris: Secondary | ICD-10-CM

## 2017-10-10 DIAGNOSIS — E785 Hyperlipidemia, unspecified: Secondary | ICD-10-CM

## 2017-10-10 MED ORDER — ATORVASTATIN CALCIUM 40 MG PO TABS: 40.0000 mg | ORAL_TABLET | Freq: Every day | ORAL | 0 refills | Status: DC

## 2017-10-10 NOTE — Telephone Encounter (Signed)
Pt called requesting medication refill for atorvastatin (LIPITOR) 40 MG tablet, pt does not have enough to last for until appt

## 2017-10-10 NOTE — Telephone Encounter (Signed)
Refilled

## 2017-10-18 ENCOUNTER — Telehealth: Payer: Self-pay | Admitting: Family Medicine

## 2017-10-18 DIAGNOSIS — E785 Hyperlipidemia, unspecified: Secondary | ICD-10-CM

## 2017-10-18 DIAGNOSIS — I2510 Atherosclerotic heart disease of native coronary artery without angina pectoris: Secondary | ICD-10-CM

## 2017-10-18 MED ORDER — ATORVASTATIN CALCIUM 40 MG PO TABS: 40.0000 mg | ORAL_TABLET | Freq: Every day | ORAL | 0 refills | Status: DC

## 2017-10-18 NOTE — Addendum Note (Signed)
Addended by: Rica Mast on: 10/18/2017 01:16 PM   Modules accepted: Orders

## 2017-10-18 NOTE — Telephone Encounter (Signed)
Patient called asking for a refill on his atorvastatin 40mg  please fu. He does have a appointment on 11/21/2017

## 2017-10-18 NOTE — Telephone Encounter (Signed)
Refilled

## 2017-11-02 MED FILL — AMLODIPINE BESYLATE 10 MG T: 10 | 30 days supply | Qty: 30 | Fill #4

## 2017-11-03 MED FILL — TORSEMIDE 20 MG TABLET: 20 | 30 days supply | Qty: 60 | Fill #0

## 2017-11-09 ENCOUNTER — Encounter: Payer: Self-pay | Admitting: Internal Medicine

## 2017-11-09 NOTE — Progress Notes (Signed)
Saw Dr. Joelyn Oms 11/03/2017 Start Bidil 20/37.5 TID Coreg 25 mg BID Cont Norvasc 10 mg and Tekturna 150 mg daily.  Stage 4 CKD, subnephrotic proteinuria.

## 2017-11-21 ENCOUNTER — Ambulatory Visit: Payer: BLUE CROSS/BLUE SHIELD | Attending: Internal Medicine | Admitting: Internal Medicine

## 2017-11-21 ENCOUNTER — Encounter: Payer: Self-pay | Admitting: Internal Medicine

## 2017-11-21 VITALS — BP 123/72 | HR 87 | Temp 97.9°F | Resp 18 | Ht 67.0 in | Wt 299.0 lb

## 2017-11-21 DIAGNOSIS — N529 Male erectile dysfunction, unspecified: Secondary | ICD-10-CM | POA: Diagnosis not present

## 2017-11-21 DIAGNOSIS — G4733 Obstructive sleep apnea (adult) (pediatric): Secondary | ICD-10-CM | POA: Diagnosis not present

## 2017-11-21 DIAGNOSIS — Z880 Allergy status to penicillin: Secondary | ICD-10-CM | POA: Insufficient documentation

## 2017-11-21 DIAGNOSIS — Z6841 Body Mass Index (BMI) 40.0 and over, adult: Secondary | ICD-10-CM | POA: Diagnosis not present

## 2017-11-21 DIAGNOSIS — I5042 Chronic combined systolic (congestive) and diastolic (congestive) heart failure: Secondary | ICD-10-CM

## 2017-11-21 DIAGNOSIS — I255 Ischemic cardiomyopathy: Secondary | ICD-10-CM | POA: Insufficient documentation

## 2017-11-21 DIAGNOSIS — E785 Hyperlipidemia, unspecified: Secondary | ICD-10-CM | POA: Diagnosis not present

## 2017-11-21 DIAGNOSIS — I10 Essential (primary) hypertension: Secondary | ICD-10-CM | POA: Diagnosis not present

## 2017-11-21 DIAGNOSIS — Z2821 Immunization not carried out because of patient refusal: Secondary | ICD-10-CM

## 2017-11-21 DIAGNOSIS — E669 Obesity, unspecified: Secondary | ICD-10-CM | POA: Insufficient documentation

## 2017-11-21 DIAGNOSIS — J449 Chronic obstructive pulmonary disease, unspecified: Secondary | ICD-10-CM | POA: Insufficient documentation

## 2017-11-21 DIAGNOSIS — N184 Chronic kidney disease, stage 4 (severe): Secondary | ICD-10-CM | POA: Diagnosis not present

## 2017-11-21 DIAGNOSIS — I130 Hypertensive heart and chronic kidney disease with heart failure and stage 1 through stage 4 chronic kidney disease, or unspecified chronic kidney disease: Secondary | ICD-10-CM | POA: Diagnosis not present

## 2017-11-21 DIAGNOSIS — K219 Gastro-esophageal reflux disease without esophagitis: Secondary | ICD-10-CM | POA: Insufficient documentation

## 2017-11-21 DIAGNOSIS — I2510 Atherosclerotic heart disease of native coronary artery without angina pectoris: Secondary | ICD-10-CM

## 2017-11-21 DIAGNOSIS — Z8674 Personal history of sudden cardiac arrest: Secondary | ICD-10-CM | POA: Diagnosis not present

## 2017-11-21 DIAGNOSIS — Z7982 Long term (current) use of aspirin: Secondary | ICD-10-CM | POA: Insufficient documentation

## 2017-11-21 DIAGNOSIS — Z79899 Other long term (current) drug therapy: Secondary | ICD-10-CM | POA: Diagnosis not present

## 2017-11-21 MED ORDER — ATORVASTATIN CALCIUM 40 MG PO TABS: 40.0000 mg | ORAL_TABLET | Freq: Every day | ORAL | 6 refills | Status: DC

## 2017-11-21 MED FILL — ATORVASTATIN 40 MG TABLET: 40 | 30 days supply | Qty: 30 | Fill #0

## 2017-11-21 NOTE — Progress Notes (Signed)
Patient ID: Victor Thompson, male    DOB: 1961-06-15  MRN: [id number]  CC: Establish Care   Subjective: Victor Thompson is a 56 y.o. male who presents for chronic ds management. Last saw Dr. Adrian Blackwater 04/2017 His concerns today include:  Pt with hx of CKD stage 4, HTN, CAD (with hx of cardiac arrest and), ICD, combined sys/dia CHF, HL, Obese, COPD  1.  CAD/CHF combined/HL: -would like to get off Bidil because it causes headache every time he takes it -no CP/SOB. + LE edema, less witth new diuretic, torsemide -he has not seen cardiology, Dr. Rayann Heman in over a yr Limits salt  2. Obesity: -gained 12 lbs since last visit -He has cut back on the amount that he eats.  -walks at [facility] 2 x a wk for 30-40 mins  3. CKD 4/HTN: followed by Dwyane Luo, Dr. Joelyn Oms and seen recently  He was Start Bidil 20/37.5 TID, Coreg 25 mg BID Cont Norvasc 10 mg and Tekturna 150 mg daily. However the latter was d/c as insurance would not cover. -still makes good urine    Patient Active Problem List   Diagnosis Date Noted  . Angioedema 04/09/2017  . COPD exacerbation ([place]) 03/04/2016  . Cardiac arrest with ventricular fibrillation ([place]) 03/04/2016  . Defibrillator discharge   . Ventricular fibrillation ([place])   . Chronic combined systolic and diastolic CHF (congestive heart failure) ([place]) 01/22/2016  . Erectile dysfunction 01/22/2016  . Allergic rhinitis 01/22/2016  . GERD (gastroesophageal reflux disease) 01/22/2016  . CKD (chronic kidney disease) stage 3, GFR 30-59 ml/min (HCC) 01/22/2016  . PROTEINURIA 02/17/2010  . Cardiomyopathy, ischemic 02/16/2010  . Obstructive sleep apnea 02/16/2010  . PENILE PAIN 01/16/2010  . EXTERNAL HEMORRHOIDS 09/26/2009  . Tobacco use disorder in remission 06/03/2009  . [place], RIGHT 06/03/2009  . Essential hypertension 10/01/2008  . RECTAL BLEEDING 10/01/2008  . DENTAL CARIES 11/08/2007  . Hyperlipidemia 07/14/2007  . OBESITY 07/14/2007  . Coronary  atherosclerosis 07/14/2007  . Asthma 07/14/2007  . ERECTILE DYSFUNCTION 12/06/2005     Current Outpatient Medications on File Prior to Visit  Medication Sig Dispense Refill  . albuterol (PROVENTIL HFA;VENTOLIN HFA) 108 (90 Base) MCG/ACT inhaler Inhale 2 puffs into the lungs every 4 (four) hours as needed for wheezing or shortness of breath. 54 g 3  . amLODipine (NORVASC) 10 MG tablet Take 1 tablet (10 mg total) by mouth daily. 30 tablet 11  . aspirin EC 81 MG tablet Take 1 tablet (81 mg total) by mouth daily. 30 tablet 5  . atorvastatin (LIPITOR) 40 MG tablet Take 1 tablet (40 mg total) at bedtime by mouth. 30 tablet 0  . beclomethasone (QVAR) 40 MCG/ACT inhaler Inhale 1 puff into the lungs 2 (two) times daily.    . carvedilol (COREG) 25 MG tablet Take 1 tablet (25 mg total) by mouth 2 (two) times daily with a meal. 60 tablet 11  . isosorbide-hydrALAZINE (BIDIL) 20-37.5 MG tablet Take 1 tablet by mouth 3 (three) times daily. 90 tablet 11  . montelukast (SINGULAIR) 10 MG tablet Take 10 mg by mouth at bedtime.     . nitroGLYCERIN (NITROSTAT) 0.4 MG SL tablet Place 0.4 mg under the tongue every 5 (five) minutes as needed for chest pain.    Marland Kitchen PRESCRIPTION MEDICATION Inhale into the lungs at bedtime. CPAP    . spironolactone (ALDACTONE) 25 MG tablet Take 1 tablet (25 mg total) by mouth daily. 30 tablet 2  . nitroGLYCERIN (NITROSTAT) 0.4 MG SL  tablet Place 1 tablet (0.4 mg total) under the tongue every 5 (five) minutes x 3 doses as needed for chest pain. (Patient not taking: Reported on 04/22/2017) 25 tablet 12   No current facility-administered medications on file prior to visit.     Allergies  Allergen Reactions  . Ace Inhibitors Swelling  . Ace Inhibitors Swelling  . Lidocaine Swelling    TONGUE SWELLS  . Lidocaine Hcl Swelling     tongue swelling  . Lisinopril Swelling    REACTION: tongue swelling.Pt reported problem with a BP med which sounded like  lisinopril  But as of 09/19/06,pt  had tolerated altace without problem  . Lisinopril Swelling    TONGUE SWELLS  . Penicillins Swelling    Tongue swelling - cannot take any "cillins" Has patient had a PCN reaction causing immediate rash, facial/tongue/throat swelling, SOB or lightheadedness with hypotension: Yes Has patient had a PCN reaction causing severe rash involving mucus membranes or skin necrosis: No Has patient had a PCN reaction that required hospitalization No Has patient had a PCN reaction occurring within the last 10 years: Yes If all of the above answers are "NO", then may proceed with Cephalosporin use.  Marland Kitchen Penicillins Swelling    TONGUE SWELLS Has patient had a PCN reaction causing immediate rash, facial/tongue/throat swelling, SOB or lightheadedness with hypotension: Yes Has patient had a PCN reaction causing severe rash involving mucus membranes or skin necrosis: No Has patient had a PCN reaction that required hospitalization No Has patient had a PCN reaction occurring within the last 10 years: Yes If all of the above answers are "NO", then may proceed with Cephalosporin use.     Social History   Socioeconomic History  . Marital status: Married    Spouse name: Not on file  . Number of children: Not on file  . Years of education: Not on file  . Highest education level: Not on file  Social Needs  . Financial resource strain: Not on file  . Food insecurity - worry: Not on file  . Food insecurity - inability: Not on file  . Transportation needs - medical: Not on file  . Transportation needs - non-medical: Not on file  Occupational History  . Not on file  Tobacco Use  . Smoking status: Never Smoker  . Smokeless tobacco: Never Used  Substance and Sexual Activity  . Alcohol use: No  . Drug use: No  . Sexual activity: Not on file  Other Topics Concern  . Not on file  Social History Narrative   ** Merged History Encounter **        Family History  Problem Relation Age of Onset  .  Hypertension Mother     Past Surgical History:  Procedure Laterality Date  . CORONARY STENT PLACEMENT    . IMPLANTABLE CARDIOVERTER DEFIBRILLATOR IMPLANT      ROS: Review of Systems Neg except as stated above PHYSICAL EXAM: BP 123/72 (BP Location: Left Arm, Patient Position: Sitting, Cuff Size: Large)   Pulse 87   Temp 97.9 F (36.6 C) (Oral)   Resp 18   Ht 5\' 7"  (1.702 m)   Wt 299 lb (135.6 kg)   SpO2 96%   BMI 46.83 kg/m   Wt Readings from Last 3 Encounters:  11/21/17 299 lb (135.6 kg)  04/22/17 287 lb 9.6 oz (130.5 kg)  06/01/16 279 lb 3.2 oz (126.6 kg)   Physical Exam  General appearance - alert, well appearing, obese, middle age AAM and  in no distress Mental status - alert, oriented to person, place, and time, normal mood, behavior, speech, dress, motor activity, and thought processes Mouth - mucous membranes moist, pharynx normal without lesions Neck - supple, no significant adenopathy Chest - clear to auscultation, no wheezes, rales or rhonchi, symmetric air entry Heart - normal rate, regular rhythm, normal S1, S2, no murmurs, rubs, clicks or gallops Extremities -trace LE edema  ASSESSMENT AND PLAN: 1. Essential hypertension -at goal.  2. Hyperlipidemia - atorvastatin (LIPITOR) 40 MG tablet; Take 1 tablet (40 mg total) by mouth at bedtime.  Dispense: 30 tablet; Refill: 6 - Lipid panel  3. CKD (chronic kidney disease) stage 4, GFR 15-29 ml/min (HCC) -followed by nephrology. Avoid NSAIDs  4. CAD in native artery -cont ASA, statin, Coreg. We discussed stopping Bidil and changing to just Hydralazine because of reported nitrate induce HA. Pt would like to f/u with cardiology and discuss options first Will refer to cardiolgy for f/u - atorvastatin (LIPITOR) 40 MG tablet; Take 1 tablet (40 mg total) by mouth at bedtime.  Dispense: 30 tablet; Refill: 6 - CBC  5. Chronic combined systolic and diastolic CHF (congestive heart failure) (HCC) -compensated  6.  Influenza vaccination declined    Patient was given the opportunity to ask questions.  Patient verbalized understanding of the plan and was able to repeat key elements of the plan.   No orders of the defined types were placed in this encounter.    Requested Prescriptions    No prescriptions requested or ordered in this encounter    No Follow-up on file.  Karle Plumber, MD, FACP

## 2017-11-21 NOTE — Patient Instructions (Signed)
Follow a Healthy Eating Plan - You can do it! Limit sugary drinks.  Avoid sodas, sweet tea, sport or energy drinks, or fruit drinks.  Drink water, lo-fat milk, or diet drinks. Limit snack foods.   Cut back on candy, cake, cookies, chips, ice cream.  These are a special treat, only in small amounts. Eat plenty of vegetables.  Especially dark green, red, and orange vegetables. Aim for at least 3 servings a day. More is better! Include fruit in your daily diet.  Whole fruit is much healthier than fruit juice! Limit "white" bread, "white" pasta, "white" rice.   Choose "100% whole grain" products, [place] or wild rice. Avoid fatty meats. Try "Meatless Monday" and choose eggs or beans one day a week.  When eating meat, choose lean meats like chicken, turkey, and fish.  Grill, broil, or bake meats instead of frying, and eat poultry without the skin. Eat less salt.  Avoid frozen pizzas, frozen dinners and salty foods.  Use seasonings other than salt in cooking.  This can help blood pressure and keep you from swelling Beer, wine and liquor have calories.  If you can safely drink alcohol, limit to 1 drink per day for women, 2 drinks for men  

## 2017-11-23 DIAGNOSIS — N184 Chronic kidney disease, stage 4 (severe): Secondary | ICD-10-CM | POA: Insufficient documentation

## 2017-12-26 ENCOUNTER — Ambulatory Visit (INDEPENDENT_AMBULATORY_CARE_PROVIDER_SITE_OTHER): Payer: BLUE CROSS/BLUE SHIELD | Admitting: Internal Medicine

## 2017-12-26 ENCOUNTER — Encounter: Payer: Self-pay | Admitting: Internal Medicine

## 2017-12-26 VITALS — BP 140/90 | HR 79 | Ht 67.0 in | Wt 295.0 lb

## 2017-12-26 DIAGNOSIS — I472 Ventricular tachycardia, unspecified: Secondary | ICD-10-CM

## 2017-12-26 DIAGNOSIS — I255 Ischemic cardiomyopathy: Secondary | ICD-10-CM

## 2017-12-26 DIAGNOSIS — I5042 Chronic combined systolic (congestive) and diastolic (congestive) heart failure: Secondary | ICD-10-CM

## 2017-12-26 LAB — CUP PACEART INCLINIC DEVICE CHECK
Date Time Interrogation Session: 20190121111445
Implantable Lead Implant Date: 20150507
Implantable Lead Location: 753862
Implantable Lead Model: 3400
Implantable Pulse Generator Implant Date: 20150507
Pulse Gen Model: 1010
Pulse Gen Serial Number: 14970

## 2017-12-26 NOTE — Progress Notes (Signed)
PCP: [name], MD Primary Cardiologist: Primary EP: Dr Annita Brod is a 57 y.o. male who presents today for routine electrophysiology followup.  Since last being seen in our clinic, the patient reports doing very well.  Continues to work.  Took 5 grandchildren to [place] this past summer and was able to walk the park without difficulty. Today, he denies symptoms of palpitations, chest pain, shortness of breath,  lower extremity edema, dizziness, presyncope, syncope, or ICD shocks.  + angioedema 5/18 and 6/18 (ED notes reviewed).  He says he sees Dr Doylene Canard regularly.  The patient is otherwise without complaint today.   Past Medical History:  Diagnosis Date  . AICD (automatic cardioverter/defibrillator) present   . Cardiac arrest ([place])   . CHF (congestive heart failure) ([place])   . Coronary artery disease   . Hypertension   . Obesity   . Pulmonary edema   . Shortness of breath dyspnea   . Sleep apnea    USES CPAP   Past Surgical History:  Procedure Laterality Date  . CORONARY STENT PLACEMENT    . IMPLANTABLE CARDIOVERTER DEFIBRILLATOR IMPLANT      ROS- all systems are reviewed and negative except as per HPI above  Current Outpatient Medications  Medication Sig Dispense Refill  . albuterol (PROVENTIL HFA;VENTOLIN HFA) 108 (90 Base) MCG/ACT inhaler Inhale 2 puffs into the lungs every 4 (four) hours as needed for wheezing or shortness of breath. 54 g 3  . amLODipine (NORVASC) 10 MG tablet Take 1 tablet (10 mg total) by mouth daily. 30 tablet 11  . aspirin EC 81 MG tablet Take 1 tablet (81 mg total) by mouth daily. 30 tablet 5  . atorvastatin (LIPITOR) 40 MG tablet Take 1 tablet (40 mg total) by mouth at bedtime. 30 tablet 6  . beclomethasone (QVAR) 40 MCG/ACT inhaler Inhale 1 puff into the lungs 2 (two) times daily.    . carvedilol (COREG) 25 MG tablet Take 1 tablet (25 mg total) by mouth 2 (two) times daily with a meal. 60 tablet 11  . isosorbide-hydrALAZINE  (BIDIL) 20-37.5 MG tablet Take 1 tablet by mouth 3 (three) times daily. 90 tablet 11  . montelukast (SINGULAIR) 10 MG tablet Take 10 mg by mouth at bedtime.     . nitroGLYCERIN (NITROSTAT) 0.4 MG SL tablet Place 1 tablet (0.4 mg total) under the tongue every 5 (five) minutes x 3 doses as needed for chest pain. 25 tablet 12  . PRESCRIPTION MEDICATION Inhale into the lungs at bedtime. CPAP    . torsemide (DEMADEX) 20 MG tablet Take 40 mg by mouth daily.    . traMADol (ULTRAM) 50 MG tablet Take 50 mg by mouth every 6 (six) hours as needed for pain.     No current facility-administered medications for this visit.     Physical Exam: Vitals:   12/26/17 1052  BP: 140/90  Pulse: 79  SpO2: 97%  Weight: 295 lb (133.8 kg)  Height: 5\' 7"  (1.702 m)    GEN- The patient is overweight appearing, alert and oriented x 3 today.   Head- normocephalic, atraumatic Eyes-  Sclera clear, conjunctiva pink Ears- hearing intact Oropharynx- clear Lungs- Clear to ausculation bilaterally, normal work of breathing S-ICD pocket is well healed Heart- Regular rate and rhythm, no murmurs, rubs or gallops, PMI not laterally displaced GI- soft, NT, ND, + BS Extremities- no clubbing, cyanosis, or edema  ICD interrogation- reviewed in detail today,  See PACEART report  Assessment and  Plan:  1.  Chronic systolic dysfunction euvolemic today Stable on an appropriate medical regimen Normal SCD function See P[facility] Art report No changes today  2. VT Well controlled with coreg Normal S-ICD function  3. CAD No ischemic symptoms  4. Obesity Body mass index is 46.2 kg/m. Weight loss is advised  Return for device check every 3 months Return to see EP APP in 1 year

## 2017-12-26 NOTE — Patient Instructions (Addendum)
Medication Instructions:  Your physician recommends that you continue on your current medications as directed. Please refer to the Current Medication list given to you today.   Labwork: None ordered   Testing/Procedures: None ordered   Follow-Up: Your physician wants you to follow-up in: 3 months in the device clinic and 12 months with Chanetta Marshall, NP  please call our office to schedule the follow-up appointment.  .    Any Other Special Instructions Will Be Listed Below (If Applicable).     If you need a refill on your cardiac medications before your next appointment, please call your pharmacy.

## 2018-01-16 MED FILL — AMLODIPINE BESYLATE 10 MG T: 10 | 30 days supply | Qty: 30 | Fill #5

## 2018-01-16 MED FILL — TORSEMIDE 20 MG TABLET: 20 | 30 days supply | Qty: 60 | Fill #1

## 2018-01-30 MED FILL — CALCITRIOL 0.25 MCG CAPS: 0.25 | 30 days supply | Qty: 30 | Fill #0

## 2018-02-01 ENCOUNTER — Telehealth: Payer: Self-pay | Admitting: Internal Medicine

## 2018-02-01 NOTE — Telephone Encounter (Signed)
Received fax from [facility] note, fax will be on the pcp in-box

## 2018-02-08 ENCOUNTER — Encounter: Payer: Self-pay | Admitting: Internal Medicine

## 2018-02-08 NOTE — Progress Notes (Signed)
Received a note from patient's nephrologist Dr. Joelyn Oms.  Patient seen 01/30/2018.  Patient with CKD stage IV presumably due to HTN and has questionable history of angioedema from ACE inhibitor.  He has subnephrotic proteinuria. Recent labs revealed creatinine of 4.0 to with estimated GFR of 18, calcium of 8 PTH of 282, hemoglobin 12.8 phosphorus 3.7 patient started on Calcitrol 0.25 MCG's 1 capsule daily.

## 2018-02-17 ENCOUNTER — Ambulatory Visit: Payer: BLUE CROSS/BLUE SHIELD | Attending: Internal Medicine | Admitting: Internal Medicine

## 2018-02-17 ENCOUNTER — Encounter: Payer: Self-pay | Admitting: Internal Medicine

## 2018-02-17 VITALS — BP 120/68 | HR 71 | Temp 97.9°F | Resp 16 | Ht 65.0 in | Wt 288.2 lb

## 2018-02-17 DIAGNOSIS — G4733 Obstructive sleep apnea (adult) (pediatric): Secondary | ICD-10-CM | POA: Diagnosis not present

## 2018-02-17 DIAGNOSIS — Z79899 Other long term (current) drug therapy: Secondary | ICD-10-CM | POA: Diagnosis not present

## 2018-02-17 DIAGNOSIS — Z8249 Family history of ischemic heart disease and other diseases of the circulatory system: Secondary | ICD-10-CM | POA: Insufficient documentation

## 2018-02-17 DIAGNOSIS — I5042 Chronic combined systolic (congestive) and diastolic (congestive) heart failure: Secondary | ICD-10-CM | POA: Diagnosis not present

## 2018-02-17 DIAGNOSIS — E785 Hyperlipidemia, unspecified: Secondary | ICD-10-CM | POA: Diagnosis not present

## 2018-02-17 DIAGNOSIS — Z8674 Personal history of sudden cardiac arrest: Secondary | ICD-10-CM | POA: Diagnosis not present

## 2018-02-17 DIAGNOSIS — I2510 Atherosclerotic heart disease of native coronary artery without angina pectoris: Secondary | ICD-10-CM | POA: Diagnosis not present

## 2018-02-17 DIAGNOSIS — Z9889 Other specified postprocedural states: Secondary | ICD-10-CM | POA: Insufficient documentation

## 2018-02-17 DIAGNOSIS — I10 Essential (primary) hypertension: Secondary | ICD-10-CM | POA: Diagnosis not present

## 2018-02-17 DIAGNOSIS — N184 Chronic kidney disease, stage 4 (severe): Secondary | ICD-10-CM | POA: Diagnosis not present

## 2018-02-17 DIAGNOSIS — R634 Abnormal weight loss: Secondary | ICD-10-CM | POA: Diagnosis not present

## 2018-02-17 DIAGNOSIS — R51 Headache: Secondary | ICD-10-CM | POA: Insufficient documentation

## 2018-02-17 DIAGNOSIS — M545 Low back pain, unspecified: Secondary | ICD-10-CM | POA: Insufficient documentation

## 2018-02-17 DIAGNOSIS — K219 Gastro-esophageal reflux disease without esophagitis: Secondary | ICD-10-CM | POA: Insufficient documentation

## 2018-02-17 DIAGNOSIS — E669 Obesity, unspecified: Secondary | ICD-10-CM | POA: Diagnosis not present

## 2018-02-17 DIAGNOSIS — Z888 Allergy status to other drugs, medicaments and biological substances status: Secondary | ICD-10-CM | POA: Diagnosis not present

## 2018-02-17 DIAGNOSIS — Z955 Presence of coronary angioplasty implant and graft: Secondary | ICD-10-CM | POA: Insufficient documentation

## 2018-02-17 DIAGNOSIS — N529 Male erectile dysfunction, unspecified: Secondary | ICD-10-CM | POA: Diagnosis not present

## 2018-02-17 DIAGNOSIS — J45909 Unspecified asthma, uncomplicated: Secondary | ICD-10-CM | POA: Insufficient documentation

## 2018-02-17 DIAGNOSIS — T783XXA Angioneurotic edema, initial encounter: Secondary | ICD-10-CM | POA: Insufficient documentation

## 2018-02-17 DIAGNOSIS — Z7982 Long term (current) use of aspirin: Secondary | ICD-10-CM | POA: Diagnosis not present

## 2018-02-17 DIAGNOSIS — I255 Ischemic cardiomyopathy: Secondary | ICD-10-CM | POA: Insufficient documentation

## 2018-02-17 DIAGNOSIS — J449 Chronic obstructive pulmonary disease, unspecified: Secondary | ICD-10-CM | POA: Diagnosis not present

## 2018-02-17 DIAGNOSIS — G8929 Other chronic pain: Secondary | ICD-10-CM | POA: Diagnosis not present

## 2018-02-17 DIAGNOSIS — Z880 Allergy status to penicillin: Secondary | ICD-10-CM | POA: Diagnosis not present

## 2018-02-17 DIAGNOSIS — I130 Hypertensive heart and chronic kidney disease with heart failure and stage 1 through stage 4 chronic kidney disease, or unspecified chronic kidney disease: Secondary | ICD-10-CM | POA: Diagnosis present

## 2018-02-17 DIAGNOSIS — K644 Residual hemorrhoidal skin tags: Secondary | ICD-10-CM | POA: Insufficient documentation

## 2018-02-17 MED ORDER — TRAMADOL HCL 50 MG PO TABS: 50.0000 mg | ORAL_TABLET | Freq: Two times a day (BID) | ORAL | 1 refills | Status: DC | PRN

## 2018-02-17 MED FILL — traMADol HCL 50 MG TABS: 50 | 7 days supply | Qty: 14 | Fill #0

## 2018-02-17 NOTE — Progress Notes (Signed)
Patient ID: Victor Thompson, male    DOB: 1961-08-20  MRN: [id number]  CC: Hypertension   Subjective: Victor Thompson is a 57 y.o. male who presents for chronic ds management.  His concerns today include:  Pt with hx of CKD stage 4, HTN, CAD (with hx of cardiac arrest and), ICD, combined sys/dia CHF, HL, Obese, COPD  1.  CAD/ICD/CHF/HTN -no CP, SOB at rest. LE edema. No PND or orthopnea -reports he stopped taking Bidil since January because of headaches.  On last visit I had recommended that we change the BiDil just to hydralazine but patient wanted to wait until he see Dr. Rayann Heman, to discuss before making changes.  He saw the cardiologist but forgot to mention it. -does not check BP Limits salt  2.  COPD:  Not using Qvar or Albuterol  No cough.  Nonsmoker  3.  Obesity:  Loss 10 lbs since last visit.  "I quit all the sugars and pasta.  I had a divorce of all the things I like. " -goes to [facility] a wk.   4.  Pain in lower back x few mths.  Located in center of lower back.  -does a lot of bending at the waist in his job.  Does mason work.  "When I bend down, it is hard to get back up." -intermittent pain and stiffness.  Last 1-2 hrs. -sometimes interrupts sleep "if I move the wrong way."  -took Tramadol intermittently in past and wanting to know if I can prescribe X-ray back 2016 revealed mild multilevel spondylosis. He denies any street drug use including marijuana.  Patient Active Problem List   Diagnosis Date Noted  . CKD (chronic kidney disease) stage 4, GFR 15-29 ml/min (HCC) 11/23/2017  . Angioedema 04/09/2017  . COPD exacerbation ([place]) 03/04/2016  . Cardiac arrest with ventricular fibrillation ([place]) 03/04/2016  . Defibrillator discharge   . Chronic combined systolic and diastolic CHF (congestive heart failure) ([place]) 01/22/2016  . Erectile dysfunction 01/22/2016  . Allergic rhinitis 01/22/2016  . GERD (gastroesophageal reflux disease) 01/22/2016  . PROTEINURIA  02/17/2010  . Cardiomyopathy, ischemic 02/16/2010  . Obstructive sleep apnea 02/16/2010  . EXTERNAL HEMORRHOIDS 09/26/2009  . Tobacco use disorder in remission 06/03/2009  . Essential hypertension 10/01/2008  . Hyperlipidemia 07/14/2007  . OBESITY 07/14/2007  . Coronary atherosclerosis 07/14/2007  . Asthma 07/14/2007     Current Outpatient Medications on File Prior to Visit  Medication Sig Dispense Refill  . albuterol (PROVENTIL HFA;VENTOLIN HFA) 108 (90 Base) MCG/ACT inhaler Inhale 2 puffs into the lungs every 4 (four) hours as needed for wheezing or shortness of breath. 54 g 3  . amLODipine (NORVASC) 10 MG tablet Take 1 tablet (10 mg total) by mouth daily. 30 tablet 11  . aspirin EC 81 MG tablet Take 1 tablet (81 mg total) by mouth daily. 30 tablet 5  . atorvastatin (LIPITOR) 40 MG tablet Take 1 tablet (40 mg total) by mouth at bedtime. 30 tablet 6  . beclomethasone (QVAR) 40 MCG/ACT inhaler Inhale 1 puff into the lungs 2 (two) times daily.    . carvedilol (COREG) 25 MG tablet Take 1 tablet (25 mg total) by mouth 2 (two) times daily with a meal. 60 tablet 11  . montelukast (SINGULAIR) 10 MG tablet Take 10 mg by mouth at bedtime.     . nitroGLYCERIN (NITROSTAT) 0.4 MG SL tablet Place 1 tablet (0.4 mg total) under the tongue every 5 (five) minutes x 3 doses as needed for  chest pain. 25 tablet 12  . PRESCRIPTION MEDICATION Inhale into the lungs at bedtime. CPAP    . torsemide (DEMADEX) 20 MG tablet Take 40 mg by mouth daily.     No current facility-administered medications on file prior to visit.     Allergies  Allergen Reactions  . Ace Inhibitors Swelling  . Ace Inhibitors Swelling  . Lidocaine Swelling    TONGUE SWELLS  . Lidocaine Hcl Swelling     tongue swelling  . Lisinopril Swelling    REACTION: tongue swelling.Pt reported problem with a BP med which sounded like  lisinopril  But as of 09/19/06,pt had tolerated altace without problem  . Lisinopril Swelling    TONGUE  SWELLS  . Penicillins Swelling    Tongue swelling - cannot take any "cillins" Has patient had a PCN reaction causing immediate rash, facial/tongue/throat swelling, SOB or lightheadedness with hypotension: Yes Has patient had a PCN reaction causing severe rash involving mucus membranes or skin necrosis: No Has patient had a PCN reaction that required hospitalization No Has patient had a PCN reaction occurring within the last 10 years: Yes If all of the above answers are "NO", then may proceed with Cephalosporin use.  Marland Kitchen Penicillins Swelling    TONGUE SWELLS Has patient had a PCN reaction causing immediate rash, facial/tongue/throat swelling, SOB or lightheadedness with hypotension: Yes Has patient had a PCN reaction causing severe rash involving mucus membranes or skin necrosis: No Has patient had a PCN reaction that required hospitalization No Has patient had a PCN reaction occurring within the last 10 years: Yes If all of the above answers are "NO", then may proceed with Cephalosporin use.     Social History   Socioeconomic History  . Marital status: Married    Spouse name: Not on file  . Number of children: Not on file  . Years of education: Not on file  . Highest education level: Not on file  Social Needs  . Financial resource strain: Not on file  . Food insecurity - worry: Not on file  . Food insecurity - inability: Not on file  . Transportation needs - medical: Not on file  . Transportation needs - non-medical: Not on file  Occupational History  . Not on file  Tobacco Use  . Smoking status: Never Smoker  . Smokeless tobacco: Never Used  Substance and Sexual Activity  . Alcohol use: No  . Drug use: No  . Sexual activity: Not on file  Other Topics Concern  . Not on file  Social History Narrative   ** Merged History Encounter **        Family History  Problem Relation Age of Onset  . Hypertension Mother     Past Surgical History:  Procedure Laterality Date  .  CORONARY STENT PLACEMENT    . IMPLANTABLE CARDIOVERTER DEFIBRILLATOR IMPLANT      ROS: Review of Systems Negative except as stated above PHYSICAL EXAM: BP 120/68   Pulse 71   Temp 97.9 F (36.6 C) (Oral)   Resp 16   Ht 5\' 5"  (1.651 m)   Wt 288 lb 3.2 oz (130.7 kg)   SpO2 98%   BMI 47.96 kg/m   Wt Readings from Last 3 Encounters:  02/17/18 288 lb 3.2 oz (130.7 kg)  12/26/17 295 lb (133.8 kg)  11/21/17 299 lb (135.6 kg)  Repeat BP 120/68  Physical Exam  General appearance - alert, well appearing, obese middle-aged African-American male and in no distress Mental status -  alert, oriented to person, place, and time, normal mood, behavior, speech, dress, motor activity, and thought processes Neck - supple, no significant adenopathy Chest - clear to auscultation, no wheezes, rales or rhonchi, symmetric air entry Heart - normal rate, regular rhythm, normal S1, S2, no murmurs, rubs, clicks or gallops Musculoskeletal -no tenderness on palpation of lumbar spine. Extremities -no lower extremity edema.  ASSESSMENT AND PLAN: 1. Essential hypertension At goal.  I removed BiDil from the list since he is no longer taking.  He is on carvedilol, furosemide and amlodipine.  Continue to limit salt in the foods  2. CAD in native artery Clinically stable.  Continue carvedilol, aspirin, Lipitor  3. Chronic combined systolic and diastolic CHF (congestive heart failure) ([place]) Compensated. Continue current meds and limit salt in the foods  4. Chronic midline low back pain without sciatica We will update imaging study.  Recommend back exercises.  Commended him on weight loss so far and encouraged him to try to lose more. We will place him on tramadol to use as needed.  I went over possible side effects of the medication including drowsiness, constipation.  Informed patient that it is a controlled substance and as such patients can develop dependence and/or addiction.  Advised to use as needed.   Controlled substance prescribing agreement reviewed and signed. [place] reviewed.  Pt was prescribed 30 tabs 01/14/2018 by Reece Packer - DG Lumbar Spine Complete; Future - traMADol (ULTRAM) 50 MG tablet; Take 1 tablet (50 mg total) by mouth every 12 (twelve) hours as needed. Each prescription to last 1 mth  Dispense: 40 tablet; Refill: 1  Patient was given the opportunity to ask questions.  Patient verbalized understanding of the plan and was able to repeat key elements of the plan.   Orders Placed This Encounter  Procedures  . DG Lumbar Spine Complete     Requested Prescriptions   Signed Prescriptions Disp Refills  . traMADol (ULTRAM) 50 MG tablet 40 tablet 1    Sig: Take 1 tablet (50 mg total) by mouth every 12 (twelve) hours as needed. Each prescription to last 1 mth    Return in about 3 years (around 02/17/2021).  Karle Plumber, MD, FACP

## 2018-02-17 NOTE — Patient Instructions (Addendum)
Congratulations on losing 10 pounds.  Keep up the good work.  I recommend increasing exercise to twice a week for 30 minutes.  Weight loss will help decrease the back pain.  I have included some information below about back exercises that may also be helpful. Back Exercises If you have pain in your back, do these exercises 2-3 times each day or as told by your doctor. When the pain goes away, do the exercises once each day, but repeat the steps more times for each exercise (do more repetitions). If you do not have pain in your back, do these exercises once each day or as told by your doctor. Exercises Single Knee to Chest  Do these steps 3-5 times in a row for each leg: 1. Lie on your back on a firm bed or the floor with your legs stretched out. 2. Bring one knee to your chest. 3. Hold your knee to your chest by grabbing your knee or thigh. 4. Pull on your knee until you feel a gentle stretch in your lower back. 5. Keep doing the stretch for 10-30 seconds. 6. Slowly let go of your leg and straighten it.  Pelvic Tilt  Do these steps 5-10 times in a row: 1. Lie on your back on a firm bed or the floor with your legs stretched out. 2. Bend your knees so they point up to the ceiling. Your feet should be flat on the floor. 3. Tighten your lower belly (abdomen) muscles to press your lower back against the floor. This will make your tailbone point up to the ceiling instead of pointing down to your feet or the floor. 4. Stay in this position for 5-10 seconds while you gently tighten your muscles and breathe evenly.  Cat-Cow  Do these steps until your lower back bends more easily: 1. Get on your hands and knees on a firm surface. Keep your hands under your shoulders, and keep your knees under your hips. You may put padding under your knees. 2. Let your head hang down, and make your tailbone point down to the floor so your lower back is round like the back of a cat. 3. Stay in this position for 5  seconds. 4. Slowly lift your head and make your tailbone point up to the ceiling so your back hangs low (sags) like the back of a cow. 5. Stay in this position for 5 seconds.  Press-Ups  Do these steps 5-10 times in a row: 1. Lie on your belly (face-down) on the floor. 2. Place your hands near your head, about shoulder-width apart. 3. While you keep your back relaxed and keep your hips on the floor, slowly straighten your arms to raise the top half of your body and lift your shoulders. Do not use your back muscles. To make yourself more comfortable, you may change where you place your hands. 4. Stay in this position for 5 seconds. 5. Slowly return to lying flat on the floor.  Bridges  Do these steps 10 times in a row: 1. Lie on your back on a firm surface. 2. Bend your knees so they point up to the ceiling. Your feet should be flat on the floor. 3. Tighten your butt muscles and lift your butt off of the floor until your waist is almost as high as your knees. If you do not feel the muscles working in your butt and the back of your thighs, slide your feet 1-2 inches farther away from your butt. 4.  Stay in this position for 3-5 seconds. 5. Slowly lower your butt to the floor, and let your butt muscles relax.  If this exercise is too easy, try doing it with your arms crossed over your chest. Belly Crunches  Do these steps 5-10 times in a row: 1. Lie on your back on a firm bed or the floor with your legs stretched out. 2. Bend your knees so they point up to the ceiling. Your feet should be flat on the floor. 3. Cross your arms over your chest. 4. Tip your chin a little bit toward your chest but do not bend your neck. 5. Tighten your belly muscles and slowly raise your chest just enough to lift your shoulder blades a tiny bit off of the floor. 6. Slowly lower your chest and your head to the floor.  Back Lifts Do these steps 5-10 times in a row: 1. Lie on your belly (face-down) with your  arms at your sides, and rest your forehead on the floor. 2. Tighten the muscles in your legs and your butt. 3. Slowly lift your chest off of the floor while you keep your hips on the floor. Keep the back of your head in line with the curve in your back. Look at the floor while you do this. 4. Stay in this position for 3-5 seconds. 5. Slowly lower your chest and your face to the floor.  Contact a doctor if:  Your back pain gets a lot worse when you do an exercise.  Your back pain does not lessen 2 hours after you exercise. If you have any of these problems, stop doing the exercises. Do not do them again unless your doctor says it is okay. Get help right away if:  You have sudden, very bad back pain. If this happens, stop doing the exercises. Do not do them again unless your doctor says it is okay. This information is not intended to replace advice given to you by your health care provider. Make sure you discuss any questions you have with your health care provider. Document Released: 12/25/2010 Document Revised: 04/29/2016 Document Reviewed: 01/16/2015 Elsevier Interactive Patient Education  [employer].

## 2018-02-23 MED FILL — ATORVASTATIN 40 MG TABLET: 40 | 30 days supply | Qty: 30 | Fill #1

## 2018-02-23 MED FILL — CARVEDILOL 25 MG TABS: 25 | 30 days supply | Qty: 60 | Fill #4

## 2018-02-23 MED FILL — traMADol HCL 50 MG TABS: 50 | 7 days supply | Qty: 14 | Fill #1

## 2018-03-20 MED FILL — traMADol HCL 50 MG TABS: 50 | 7 days supply | Qty: 14 | Fill #2

## 2018-03-21 ENCOUNTER — Telehealth: Payer: Self-pay | Admitting: Internal Medicine

## 2018-03-21 NOTE — Telephone Encounter (Signed)
6 page, paperwork received through fax 03-21-18.

## 2018-03-27 ENCOUNTER — Ambulatory Visit (INDEPENDENT_AMBULATORY_CARE_PROVIDER_SITE_OTHER): Payer: BLUE CROSS/BLUE SHIELD | Admitting: *Deleted

## 2018-03-27 DIAGNOSIS — I469 Cardiac arrest, cause unspecified: Secondary | ICD-10-CM

## 2018-03-27 DIAGNOSIS — I4901 Ventricular fibrillation: Secondary | ICD-10-CM | POA: Diagnosis not present

## 2018-03-27 NOTE — Progress Notes (Signed)
Subcutaneous ICD check in clinic. 0 untreated episodes; 0 treated episodes; 0 shocks delivered. Electrode impedance status okay. No programming changes. Remaining longevity to ERI 41%. ROV with DC in 74mo.

## 2018-03-29 ENCOUNTER — Telehealth: Payer: Self-pay | Admitting: Internal Medicine

## 2018-03-29 ENCOUNTER — Encounter: Payer: Self-pay | Admitting: Internal Medicine

## 2018-03-29 NOTE — Telephone Encounter (Signed)
I received notes from [facility].  Patient seen 03/20/2018.  Patient with CKD stage IV.  We began speaking with the patient about renal replacement therapy.  Patient on Calcitrol 0.25 MCG 1 capsule daily.  Started on vitamin D 5000 units international units daily. Recent labs revealed a BUN of 44 and creatinine of 4.04.  Hemoglobin 11.2.

## 2018-04-03 ENCOUNTER — Emergency Department (HOSPITAL_COMMUNITY)
Admission: EM | Admit: 2018-04-03 | Discharge: 2018-04-03 | Disposition: A | Discharge status: Home / Self Care | Payer: BLUE CROSS/BLUE SHIELD | Attending: Emergency Medicine | Admitting: Emergency Medicine

## 2018-04-03 ENCOUNTER — Encounter (HOSPITAL_COMMUNITY): Payer: Self-pay | Admitting: *Deleted

## 2018-04-03 ENCOUNTER — Emergency Department (HOSPITAL_COMMUNITY): Payer: BLUE CROSS/BLUE SHIELD

## 2018-04-03 DIAGNOSIS — E785 Hyperlipidemia, unspecified: Secondary | ICD-10-CM | POA: Diagnosis not present

## 2018-04-03 DIAGNOSIS — Z79899 Other long term (current) drug therapy: Secondary | ICD-10-CM | POA: Diagnosis not present

## 2018-04-03 DIAGNOSIS — Z95810 Presence of automatic (implantable) cardiac defibrillator: Secondary | ICD-10-CM | POA: Diagnosis not present

## 2018-04-03 DIAGNOSIS — Z7982 Long term (current) use of aspirin: Secondary | ICD-10-CM | POA: Diagnosis not present

## 2018-04-03 DIAGNOSIS — I130 Hypertensive heart and chronic kidney disease with heart failure and stage 1 through stage 4 chronic kidney disease, or unspecified chronic kidney disease: Secondary | ICD-10-CM | POA: Insufficient documentation

## 2018-04-03 DIAGNOSIS — I509 Heart failure, unspecified: Secondary | ICD-10-CM

## 2018-04-03 DIAGNOSIS — I2510 Atherosclerotic heart disease of native coronary artery without angina pectoris: Secondary | ICD-10-CM | POA: Diagnosis not present

## 2018-04-03 DIAGNOSIS — N184 Chronic kidney disease, stage 4 (severe): Secondary | ICD-10-CM | POA: Diagnosis not present

## 2018-04-03 DIAGNOSIS — I5043 Acute on chronic combined systolic (congestive) and diastolic (congestive) heart failure: Secondary | ICD-10-CM | POA: Insufficient documentation

## 2018-04-03 DIAGNOSIS — R0602 Shortness of breath: Secondary | ICD-10-CM | POA: Diagnosis present

## 2018-04-03 LAB — I-STAT TROPONIN, ED
Troponin i, poc: 0.02 ng/mL (ref 0.00–0.08)
Troponin i, poc: 0.01 ng/mL (ref 0.00–0.08)

## 2018-04-03 LAB — BASIC METABOLIC PANEL
Anion gap: 8 (ref 5–15)
BUN: 38 mg/dL — ABNORMAL HIGH (ref 6–20)
CO2: 20 mmol/L — ABNORMAL LOW (ref 22–32)
Calcium: 7.6 mg/dL — ABNORMAL LOW (ref 8.9–10.3)
Chloride: 114 mmol/L — ABNORMAL HIGH (ref 101–111)
Creatinine, Ser: 4.31 mg/dL — ABNORMAL HIGH (ref 0.61–1.24)
GFR calc Af Amer: 17 mL/min — ABNORMAL LOW (ref >60)
GFR calc non Af Amer: 14 mL/min — ABNORMAL LOW (ref >60)
Glucose, Bld: 108 mg/dL — ABNORMAL HIGH (ref 65–99)
Potassium: 3.3 mmol/L — ABNORMAL LOW (ref 3.5–5.1)
Sodium: 142 mmol/L (ref 135–145)

## 2018-04-03 LAB — CBC
HCT: 35.5 % — ABNORMAL LOW (ref 39.0–52.0)
Hemoglobin: 11.6 g/dL — ABNORMAL LOW (ref 13.0–17.0)
MCH: 29.2 pg (ref 26.0–34.0)
MCHC: 32.7 g/dL (ref 30.0–36.0)
MCV: 89.4 fL (ref 78.0–100.0)
Platelets: 191 10*3/uL (ref 150–400)
RBC: 3.97 MIL/uL — ABNORMAL LOW (ref 4.22–5.81)
RDW: 15.1 % (ref 11.5–15.5)
WBC: 5.2 10*3/uL (ref 4.0–10.5)

## 2018-04-03 LAB — BRAIN NATRIURETIC PEPTIDE: B Natriuretic Peptide: 418.5 pg/mL — ABNORMAL HIGH (ref 0.0–100.0)

## 2018-04-03 MED ORDER — METOLAZONE 5 MG PO TABS: 2.5000 mg | ORAL_TABLET | Freq: Every day | ORAL | 0 refills | Status: DC

## 2018-04-03 MED ORDER — IPRATROPIUM-ALBUTEROL 0.5-2.5 (3) MG/3ML IN SOLN
3.0000 mL | Freq: Once | RESPIRATORY_TRACT | Status: AC
  Administered 2018-04-03: 3 mL via RESPIRATORY_TRACT
  Filled 2018-04-03: qty 3

## 2018-04-03 NOTE — ED Notes (Signed)
ED Provider at bedside. 

## 2018-04-03 NOTE — ED Provider Notes (Signed)
[facility] EMERGENCY DEPARTMENT Provider Note   CSN: [id number] Arrival date & time: 04/03/18  1607     History   Chief Complaint Chief Complaint  Patient presents with  . Shortness of Breath    HPI Victor Thompson is a 57 y.o. male.  The history is provided by the patient and medical records. No language interpreter was used.   Victor Thompson is a 57 y.o. male  with a PMH of prior cardiac arrest with AICD in place, CAD, CHF, HTN, asthma who presents to the Emergency Department complaining of shortness of breath over the last week.  Feels as if it got worse last night.  Shortness of breath does get worse with exertion.  He states that he walked up about 13 stairs.  Typically this does not cause him any problems, but he got very winded after doing this.  Does note a little more swelling in his legs than usual.  No calf pain.  He is on furosemide daily, but has missed a couple doses this week.  Associated with a burning sensation across the chest wall, but denies any chest pain.  Also endorses intermittently productive cough and wheezing.  He tried his inhaler which did somewhat help with his symptoms.  No fever, chills, abdominal pain, nausea, vomiting, diaphoresis, back pain.  Past Medical History:  Diagnosis Date  . AICD (automatic cardioverter/defibrillator) present   . Cardiac arrest ([place])   . CHF (congestive heart failure) ([place])   . Coronary artery disease   . Hypertension   . Obesity   . Pulmonary edema   . Shortness of breath dyspnea   . Sleep apnea    USES CPAP    Patient Active Problem List   Diagnosis Date Noted  . Chronic midline low back pain without sciatica 02/17/2018  . CKD (chronic kidney disease) stage 4, GFR 15-29 ml/min (HCC) 11/23/2017  . Angioedema 04/09/2017  . COPD exacerbation ([place]) 03/04/2016  . Cardiac arrest with ventricular fibrillation ([place]) 03/04/2016  . Defibrillator discharge   . Chronic combined systolic and diastolic CHF  (congestive heart failure) ([place]) 01/22/2016  . Erectile dysfunction 01/22/2016  . Allergic rhinitis 01/22/2016  . GERD (gastroesophageal reflux disease) 01/22/2016  . PROTEINURIA 02/17/2010  . Cardiomyopathy, ischemic 02/16/2010  . Obstructive sleep apnea 02/16/2010  . EXTERNAL HEMORRHOIDS 09/26/2009  . Tobacco use disorder in remission 06/03/2009  . Essential hypertension 10/01/2008  . Hyperlipidemia 07/14/2007  . OBESITY 07/14/2007  . Coronary atherosclerosis 07/14/2007  . Asthma 07/14/2007    Past Surgical History:  Procedure Laterality Date  . CORONARY STENT PLACEMENT    . IMPLANTABLE CARDIOVERTER DEFIBRILLATOR IMPLANT          Home Medications    Prior to Admission medications   Medication Sig Start Date End Date Taking? Authorizing Provider  albuterol (PROVENTIL HFA;VENTOLIN HFA) 108 (90 Base) MCG/ACT inhaler Inhale 2 puffs into the lungs every 4 (four) hours as needed for wheezing or shortness of breath. 02/06/16  Yes Charlott Rakes, MD  amLODipine (NORVASC) 10 MG tablet Take 1 tablet (10 mg total) by mouth daily. 04/22/17  Yes [name], [name], MD  aspirin EC 81 MG tablet Take 1 tablet (81 mg total) by mouth daily. 01/22/16  Yes [name], Josalyn, MD  atorvastatin (LIPITOR) 40 MG tablet Take 1 tablet (40 mg total) by mouth at bedtime. 11/21/17  Yes [name], MD  carvedilol (COREG) 25 MG tablet Take 1 tablet (25 mg total) by mouth 2 (two) times daily with  a meal. 04/22/17  Yes [name], [name], MD  PRESCRIPTION MEDICATION Inhale into the lungs at bedtime. CPAP   Yes [provider]  torsemide (DEMADEX) 20 MG tablet Take 40 mg by mouth daily.   Yes [provider]  traMADol (ULTRAM) 50 MG tablet Take 1 tablet (50 mg total) by mouth every 12 (twelve) hours as needed. Each prescription to last 1 mth 02/17/18  Yes [name], MD  metolazone (ZAROXOLYN) 5 MG tablet Take 0.5 tablets (2.5 mg total) by mouth daily. Take 1 tablet (2.5 mg total) by  mouth daily 30 minutes before you take your fluid pill. 04/03/18   Shaquavia Whisonant, Ozella Almond, PA-C  nitroGLYCERIN (NITROSTAT) 0.4 MG SL tablet Place 1 tablet (0.4 mg total) under the tongue every 5 (five) minutes x 3 doses as needed for chest pain. 03/06/16   Charolette Forward, MD    Family History Family History  Problem Relation Age of Onset  . Hypertension Mother     Social History Social History   Tobacco Use  . Smoking status: Never Smoker  . Smokeless tobacco: Never Used  Substance Use Topics  . Alcohol use: No  . Drug use: No     Allergies   Ace inhibitors; Ace inhibitors; Lidocaine; Lidocaine hcl; Lisinopril; Lisinopril; Penicillins; and Penicillins   Review of Systems Review of Systems  Constitutional: Negative for fever.  Respiratory: Positive for cough, shortness of breath and wheezing.   Cardiovascular: Positive for leg swelling. Negative for chest pain and palpitations.  All other systems reviewed and are negative.   Physical Exam Updated Vital Signs BP (!) 144/105   Pulse 72   Temp 98.2 F (36.8 C) (Oral)   Resp 19   SpO2 98%   Physical Exam  Constitutional: He is oriented to person, place, and time. He appears well-developed and well-nourished. No distress.  HENT:  Head: Normocephalic and atraumatic.  Neck: Neck supple. No JVD present.  Cardiovascular: Normal rate, regular rhythm, normal heart sounds and intact distal pulses.  Pulmonary/Chest: Effort normal and breath sounds normal. No respiratory distress. He has no wheezes. He has no rales. He exhibits no tenderness.  Abdominal: Soft. He exhibits no distension. There is no tenderness.  Musculoskeletal:  1+ lower extremity edema bilaterally. No calf tenderness.  Neurological: He is alert and oriented to person, place, and time.  Skin: Skin is warm and dry.  Nursing note and vitals reviewed.    ED Treatments / Results  Labs (all labs ordered are listed, but only abnormal results are displayed) Labs  Reviewed  BASIC METABOLIC PANEL - Abnormal; Notable for the following components:      Result Value   Potassium 3.3 (*)    Chloride 114 (*)    CO2 20 (*)    Glucose, Bld 108 (*)    BUN 38 (*)    Creatinine, Ser 4.31 (*)    Calcium 7.6 (*)    GFR calc non Af Amer 14 (*)    GFR calc Af Amer 17 (*)    All other components within normal limits  CBC - Abnormal; Notable for the following components:   RBC 3.97 (*)    Hemoglobin 11.6 (*)    HCT 35.5 (*)    All other components within normal limits  BRAIN NATRIURETIC PEPTIDE - Abnormal; Notable for the following components:   B Natriuretic Peptide 418.5 (*)    All other components within normal limits  I-STAT TROPONIN, ED  I-STAT TROPONIN, ED  EKG EKG Interpretation  Date/Time:  Monday April 03 2018 06:14:47 EDT Ventricular Rate:  76 PR Interval:  170 QRS Duration: 136 QT Interval:  456 QTC Calculation: 513 R Axis:   -51 Text Interpretation:  Normal sinus rhythm Left axis deviation Non-specific intra-ventricular conduction block Abnormal QRS-T angle, consider primary T wave abnormality Abnormal ECG No significant change since last tracing Confirmed by Ripley Fraise [phone]) on 04/03/2018 6:25:52 AM Also confirmed by Ripley Fraise [phone]), editor Hattie Perch ([id number])  on 04/03/2018 6:50:19 AM   Radiology Dg Chest 2 View  Result Date: 04/03/2018 CLINICAL DATA:  Shortness of breath for the past week, worse tonight. Chest pain with cough. EXAM: CHEST - 2 VIEW COMPARISON:  11/24/2015 FINDINGS: Generator pack over the left lower lateral chest wall with lead tips in the soft tissues anterior to the mid chest. Mild cardiac enlargement. No vascular congestion or edema. No airspace disease or consolidation in the lungs. No blunting of costophrenic angles. No pneumothorax. Mediastinal contours appear intact. Degenerative changes in the spine. IMPRESSION: Mild cardiac enlargement.  No evidence of active pulmonary disease.  Electronically Signed   By: Lucienne Capers M.D.   On: 04/03/2018 06:47    Procedures Procedures (including critical care time)  Medications Ordered in ED Medications  ipratropium-albuterol (DUONEB) 0.5-2.5 (3) MG/3ML nebulizer solution 3 mL (3 mLs Nebulization Given 04/03/18 1247)     Initial Impression / Assessment and Plan / ED Course  I have reviewed the triage vital signs and the nursing notes.  Pertinent labs & imaging results that were available during my care of the patient were reviewed by me and considered in my medical decision making (see chart for details).   Victor Thompson is a 57 y.o. male who presents to ED for shortness of breath over the last week. On exam, patient is afebrile, hemodynamically stable. Speaking in full sentences without difficulty. Oxygenating great >96% O2. Lungs are clear. He does have 1+ pitting edema. Hx of CHF on Demadex. He has missed two doses this week. CXR with cardiomegaly, but no vascular congestion or edema. No PNA. Troponin negative x2 and EKG without acute findings. Doubt ACS. BMP with creatinine of 4.31 - per chart review, patient followed by nephrology with creatinine of 4 last month. BNP elevated at 418.5 - chf likely etiology for shortness of breath today.  Ambulated in the emergency department and maintained oxygenation > 93%. Will start him on zaroxolyn 5mg  30 minutes before his Demadex for the next 3 days to help with diuresis. Stressed importance of not missing any doses of his Demadex as well. Encouraged him to call cards today and schedule follow up appointment this week for recheck. Reasons to return to ER discussed and all questions answered.    Patient discussed with Dr. Jeneen Rinks who agrees with treatment plan.   Final Clinical Impressions(s) / ED Diagnoses   Final diagnoses:  Acute on chronic congestive heart failure, unspecified heart failure type [facility])    ED Discharge Orders        Ordered    metolazone (ZAROXOLYN) 5 MG tablet   Daily     04/03/18 1456       Matisyn Cabeza, Ozella Almond, PA-C 04/03/18 1507    [name], MD 04/09/18 [phone]

## 2018-04-03 NOTE — ED Notes (Addendum)
Pt verbalized understanding of all d/c instructions, prescriptions, and f/u information. Opportunity for questioning and answers provided. All belongings with patient at this time. Pt ambulatory to lobby with steady gait.

## 2018-04-03 NOTE — Discharge Instructions (Signed)
It was my pleasure taking care of you today!   Call your cardiologist today and schedule a follow up appointment, preferably this week!   Take the medicine prescribed today (metolazone) daily. You should take this medication, wait 30 minutes, then take your fluid pill (torsemide/demadex).  Please return to ER for new or worsening symptoms, any additional concerns.

## 2018-04-03 NOTE — ED Triage Notes (Signed)
Pt c/o sob for the past week worse tonight. Pt reports chest pain with coughing, described as a burning sensation

## 2018-04-03 NOTE — ED Notes (Signed)
Ambulate pt in the hall.  Pt had steady gait and O2 started at 98% and went down to 92% while walking.  O2 went back to 98 upon sitting back down.

## 2018-04-04 MED FILL — CALCITRIOL 0.25 MCG CAPS: 0.25 | 30 days supply | Qty: 30 | Fill #1

## 2018-04-18 MED FILL — ATORVASTATIN 40 MG TABLET: 40 | 30 days supply | Qty: 30 | Fill #2

## 2018-04-18 MED FILL — CARVEDILOL 25 MG TABS: 25 | 30 days supply | Qty: 60 | Fill #5

## 2018-04-24 ENCOUNTER — Telehealth: Payer: Self-pay | Admitting: Internal Medicine

## 2018-04-24 NOTE — Telephone Encounter (Signed)
5 page, paperwork received through fax 5.20.19

## 2018-05-08 MED FILL — TORSEMIDE 20 MG TABLET: 20 | 30 days supply | Qty: 60 | Fill #0

## 2018-05-08 MED FILL — traMADol HCL 50 MG TABS: 50 | 7 days supply | Qty: 14 | Fill #3

## 2018-05-10 ENCOUNTER — Other Ambulatory Visit: Payer: Self-pay

## 2018-05-10 DIAGNOSIS — I10 Essential (primary) hypertension: Secondary | ICD-10-CM

## 2018-05-10 MED ORDER — AMLODIPINE BESYLATE 10 MG PO TABS: 10.0000 mg | ORAL_TABLET | Freq: Every day | ORAL | 3 refills | Status: DC

## 2018-05-10 MED FILL — AMLODIPINE BESYLATE 10 MG T: 10 | 30 days supply | Qty: 30 | Fill #0

## 2018-05-12 ENCOUNTER — Other Ambulatory Visit: Payer: Self-pay

## 2018-05-12 DIAGNOSIS — I10 Essential (primary) hypertension: Secondary | ICD-10-CM

## 2018-05-12 DIAGNOSIS — I5042 Chronic combined systolic (congestive) and diastolic (congestive) heart failure: Secondary | ICD-10-CM

## 2018-05-12 MED ORDER — CARVEDILOL 25 MG PO TABS: 25.0000 mg | ORAL_TABLET | Freq: Two times a day (BID) | ORAL | 5 refills | Status: DC

## 2018-05-12 MED FILL — CARVEDILOL 25 MG TABLET: 25 | 30 days supply | Qty: 60 | Fill #0

## 2018-05-17 MED FILL — CALCITRIOL 0.25 MCG CAPS: 0.25 | 30 days supply | Qty: 30 | Fill #2

## 2018-06-20 MED FILL — ATORVASTATIN CALCIUM 40 MG: 40 | 30 days supply | Qty: 30 | Fill #3

## 2018-06-20 MED FILL — CALCITRIOL 0.25 MCG CAPS: 0.25 | 30 days supply | Qty: 30 | Fill #3

## 2018-06-20 MED FILL — TORSEMIDE 20 MG TABLET: 20 | 30 days supply | Qty: 60 | Fill #1

## 2018-06-20 MED FILL — CARVEDILOL 25 MG TABLET: 25 | 30 days supply | Qty: 60 | Fill #1

## 2018-06-20 MED FILL — AMLODIPINE BESYLATE 10 MG T: 10 | 30 days supply | Qty: 30 | Fill #1

## 2018-06-20 MED FILL — traMADol HCL 50 MG TABS: 50 | 7 days supply | Qty: 14 | Fill #4

## 2018-07-06 ENCOUNTER — Encounter: Payer: Self-pay | Admitting: *Deleted

## 2018-07-06 ENCOUNTER — Ambulatory Visit (INDEPENDENT_AMBULATORY_CARE_PROVIDER_SITE_OTHER): Payer: BLUE CROSS/BLUE SHIELD | Admitting: *Deleted

## 2018-07-06 ENCOUNTER — Other Ambulatory Visit: Payer: BLUE CROSS/BLUE SHIELD

## 2018-07-06 VITALS — BP 141/88 | HR 70

## 2018-07-06 DIAGNOSIS — I5042 Chronic combined systolic (congestive) and diastolic (congestive) heart failure: Secondary | ICD-10-CM

## 2018-07-06 DIAGNOSIS — I472 Ventricular tachycardia, unspecified: Secondary | ICD-10-CM

## 2018-07-06 DIAGNOSIS — I10 Essential (primary) hypertension: Secondary | ICD-10-CM

## 2018-07-06 LAB — BASIC METABOLIC PANEL
BUN/Creatinine Ratio: 8 — ABNORMAL LOW (ref 9–20)
BUN: 38 mg/dL — ABNORMAL HIGH (ref 6–24)
CO2: 20 mmol/L (ref 20–29)
Calcium: 8 mg/dL — ABNORMAL LOW (ref 8.7–10.2)
Chloride: 109 mmol/L — ABNORMAL HIGH (ref 96–106)
Creatinine, Ser: 4.85 mg/dL — ABNORMAL HIGH (ref 0.76–1.27)
GFR calc Af Amer: 15 mL/min/{1.73_m2} — ABNORMAL LOW (ref >59)
GFR calc non Af Amer: 13 mL/min/{1.73_m2} — ABNORMAL LOW (ref >59)
Glucose: 108 mg/dL — ABNORMAL HIGH (ref 65–99)
Potassium: 4.2 mmol/L (ref 3.5–5.2)
Sodium: 145 mmol/L — ABNORMAL HIGH (ref 134–144)

## 2018-07-06 LAB — CUP PACEART INCLINIC DEVICE CHECK
Date Time Interrogation Session: 20190801120524
Implantable Lead Implant Date: 20150507
Implantable Lead Location: 753862
Implantable Lead Model: 3400
Implantable Pulse Generator Implant Date: 20150507
Pulse Gen Model: 1010
Pulse Gen Serial Number: 14970

## 2018-07-06 LAB — MAGNESIUM: Magnesium: 2.2 mg/dL (ref 1.6–2.3)

## 2018-07-06 MED ORDER — CARVEDILOL 25 MG PO TABS: 37.5000 mg | ORAL_TABLET | Freq: Two times a day (BID) | ORAL | 5 refills | Status: DC

## 2018-07-06 NOTE — Progress Notes (Signed)
Subcutaneous ICD check in clinic. 0 untreated episodes; 1 treated episodes; 1 successful shocks delivered-- EGM appears VT. Patient states he was at work all day in the heat, he was walking to get some water when he felt dizzy and received a shock, immediately feeling better. Patient states he took his medication as ordered. Reviewed with Chanetta Marshall, NP-- ordered labs: BMET and Magnesium, Increase Carvedilol to 37.5 mg BID and schedule 3 month f/u. BP 141/88. Advised patient to monitor BP with increase in Carvedilol. Advised patient no driving for 6 months from time of episode 06/19/18.  Electrode impedance status okay. No programming changes. Remaining longevity to ERI 41%. ROV with AS in 3 months.  Photo attached in chart with patient permission.

## 2018-07-06 NOTE — Patient Instructions (Addendum)
INCREASE Carvedilol 37.5 mg 2 times daily  Labs collected today BMET and Magnesium  Follow-up with Chanetta Marshall NP in 3 months

## 2018-07-07 ENCOUNTER — Telehealth: Payer: Self-pay | Admitting: *Deleted

## 2018-07-07 NOTE — Telephone Encounter (Signed)
Patient aware of stable labs.  He denies questions or concerns at this time.

## 2018-07-07 NOTE — Telephone Encounter (Signed)
-----   Message from Patsey Berthold, NP sent at 07/06/2018  4:11 PM EDT ----- Please notify patient of stable labs. Thanks!

## 2018-07-07 NOTE — Telephone Encounter (Signed)
LMOVM requesting call back to the [facility].

## 2018-07-21 ENCOUNTER — Telehealth: Payer: Self-pay | Admitting: Internal Medicine

## 2018-07-21 MED FILL — CALCITRIOL 0.25 MCG CAPS: 0.25 | 30 days supply | Qty: 30 | Fill #4

## 2018-07-21 MED FILL — ATORVASTATIN CALCIUM 40 MG: 40 | 30 days supply | Qty: 30 | Fill #4

## 2018-07-21 MED FILL — TORSEMIDE 20 MG TABLET: 20 | 30 days supply | Qty: 60 | Fill #2

## 2018-07-21 MED FILL — AMLODIPINE BESYLATE 10 MG T: 10 | 30 days supply | Qty: 30 | Fill #2

## 2018-07-21 MED FILL — CARVEDILOL 25 MG TABLET: 25 | 30 days supply | Qty: 90 | Fill #0

## 2018-07-21 NOTE — Telephone Encounter (Signed)
Will route to PCP for review. Patient was informed to go to ED

## 2018-07-21 NOTE — Telephone Encounter (Signed)
Patient called to let provider know that he is coughing up blood, and just wanted to let his provider know.

## 2018-07-25 NOTE — Telephone Encounter (Signed)
Could you schedule pt an appointment

## 2018-07-27 ENCOUNTER — Ambulatory Visit: Payer: BLUE CROSS/BLUE SHIELD | Attending: Internal Medicine | Admitting: Physician Assistant

## 2018-07-27 VITALS — BP 122/75 | HR 72 | Temp 98.2°F | Resp 18 | Ht 67.0 in | Wt 291.0 lb

## 2018-07-27 DIAGNOSIS — E669 Obesity, unspecified: Secondary | ICD-10-CM | POA: Diagnosis not present

## 2018-07-27 DIAGNOSIS — R042 Hemoptysis: Secondary | ICD-10-CM | POA: Insufficient documentation

## 2018-07-27 DIAGNOSIS — Z888 Allergy status to other drugs, medicaments and biological substances status: Secondary | ICD-10-CM | POA: Insufficient documentation

## 2018-07-27 DIAGNOSIS — M545 Low back pain, unspecified: Secondary | ICD-10-CM

## 2018-07-27 DIAGNOSIS — Z880 Allergy status to penicillin: Secondary | ICD-10-CM | POA: Diagnosis not present

## 2018-07-27 DIAGNOSIS — G4730 Sleep apnea, unspecified: Secondary | ICD-10-CM | POA: Insufficient documentation

## 2018-07-27 DIAGNOSIS — Z79899 Other long term (current) drug therapy: Secondary | ICD-10-CM | POA: Insufficient documentation

## 2018-07-27 DIAGNOSIS — I2510 Atherosclerotic heart disease of native coronary artery without angina pectoris: Secondary | ICD-10-CM | POA: Insufficient documentation

## 2018-07-27 DIAGNOSIS — G8929 Other chronic pain: Secondary | ICD-10-CM | POA: Insufficient documentation

## 2018-07-27 DIAGNOSIS — Z95810 Presence of automatic (implantable) cardiac defibrillator: Secondary | ICD-10-CM | POA: Diagnosis not present

## 2018-07-27 DIAGNOSIS — I130 Hypertensive heart and chronic kidney disease with heart failure and stage 1 through stage 4 chronic kidney disease, or unspecified chronic kidney disease: Secondary | ICD-10-CM | POA: Insufficient documentation

## 2018-07-27 DIAGNOSIS — N184 Chronic kidney disease, stage 4 (severe): Secondary | ICD-10-CM | POA: Insufficient documentation

## 2018-07-27 DIAGNOSIS — J40 Bronchitis, not specified as acute or chronic: Secondary | ICD-10-CM | POA: Insufficient documentation

## 2018-07-27 DIAGNOSIS — M1611 Unilateral primary osteoarthritis, right hip: Secondary | ICD-10-CM | POA: Insufficient documentation

## 2018-07-27 DIAGNOSIS — Z7982 Long term (current) use of aspirin: Secondary | ICD-10-CM | POA: Insufficient documentation

## 2018-07-27 DIAGNOSIS — I10 Essential (primary) hypertension: Secondary | ICD-10-CM | POA: Diagnosis not present

## 2018-07-27 DIAGNOSIS — Z8674 Personal history of sudden cardiac arrest: Secondary | ICD-10-CM | POA: Diagnosis not present

## 2018-07-27 MED ORDER — AZITHROMYCIN 250 MG PO TABS: ORAL_TABLET | ORAL | 0 refills | Status: DC

## 2018-07-27 MED ORDER — CYCLOBENZAPRINE HCL 10 MG PO TABS: 10.0000 mg | ORAL_TABLET | Freq: Every day | ORAL | 0 refills | Status: DC

## 2018-07-27 MED ORDER — TRAMADOL HCL 50 MG PO TABS: 50.0000 mg | ORAL_TABLET | Freq: Two times a day (BID) | ORAL | 0 refills | Status: DC | PRN

## 2018-07-27 MED FILL — AZITHROMYCIN 250 MG TABLET: 250 | 5 days supply | Qty: 6 | Fill #0

## 2018-07-27 MED FILL — CYCLOBENZAPRINE 10 MG TAB: 10 | 30 days supply | Qty: 30 | Fill #0

## 2018-07-27 NOTE — Progress Notes (Signed)
Patient ID: Victor Thompson, male   DOB: Jun 12, 1961, 57 y.o.   MRN: [id number]    [name], is a 57 y.o. male  MAU:[id number]  BWL:[id number]  DOB - 1961/06/13  Subjective:  Chief Complaint and HPI: Victor Thompson is a 57 y.o. male here today for cough X 2 weeks with some blood tinged mucus.  He denies f/c.  No weight loss or night sweats.  No recent travel. OTC meds not helping.  No TB exposure.  Recently saw transplant team and has been refused for kidney transplant.  He sees Dr Joelyn Oms in 1 month.  He can not tell me the reason he was refused for transplant.    Also c/o ongoing issue or LBP and R hip arthritis that is worse at night.  No new numbness or weakness.  This is ongoing and long-standing.  Anti-inflammatories help.  Tramadol doesn't help and keeps him awake.  ( I am unsure if he should be taking anti-inflammatories due to kidney disease.)  ROS:   Constitutional:  No f/c, No night sweats, No unexplained weight loss. EENT:  No vision changes, No blurry vision, No hearing changes. No mouth, throat, or ear problems.  Respiratory: + cough, No SOB, some bloody mucus Cardiac: No CP, no palpitations GI:  No abd pain, No N/V/D. GU: No Urinary s/sx Musculoskeletal: +LBP and hip pain Neuro: No headache, no dizziness, no motor weakness.  Skin: No rash Endocrine:  No polydipsia. No polyuria.  Psych: Denies SI/HI  No problems updated.  ALLERGIES: Allergies  Allergen Reactions  . Ace Inhibitors Swelling  . Ace Inhibitors Swelling  . Lidocaine Swelling    TONGUE SWELLS  . Lidocaine Hcl Swelling     tongue swelling  . Lisinopril Swelling    REACTION: tongue swelling.Pt reported problem with a BP med which sounded like  lisinopril  But as of 09/19/06,pt had tolerated altace without problem  . Lisinopril Swelling    TONGUE SWELLS  . Penicillins Swelling    Tongue swelling - cannot take any "cillins" Has patient had a PCN reaction causing immediate rash, facial/tongue/throat  swelling, SOB or lightheadedness with hypotension: Yes Has patient had a PCN reaction causing severe rash involving mucus membranes or skin necrosis: No Has patient had a PCN reaction that required hospitalization No Has patient had a PCN reaction occurring within the last 10 years: Yes If all of the above answers are "NO", then may proceed with Cephalosporin use.  Marland Kitchen Penicillins Swelling    TONGUE SWELLS Has patient had a PCN reaction causing immediate rash, facial/tongue/throat swelling, SOB or lightheadedness with hypotension: Yes Has patient had a PCN reaction causing severe rash involving mucus membranes or skin necrosis: No Has patient had a PCN reaction that required hospitalization No Has patient had a PCN reaction occurring within the last 10 years: Yes If all of the above answers are "NO", then may proceed with Cephalosporin use.     PAST MEDICAL HISTORY: Past Medical History:  Diagnosis Date  . AICD (automatic cardioverter/defibrillator) present   . Cardiac arrest ([place])   . CHF (congestive heart failure) ([place])   . Coronary artery disease   . Hypertension   . Obesity   . Pulmonary edema   . Shortness of breath dyspnea   . Sleep apnea    USES CPAP    MEDICATIONS AT HOME: Prior to Admission medications   Medication Sig Start Date End Date Taking? Authorizing Provider  albuterol (PROVENTIL HFA;VENTOLIN HFA) 108 (90 Base) MCG/ACT inhaler Inhale  2 puffs into the lungs every 4 (four) hours as needed for wheezing or shortness of breath. 02/06/16  Yes Charlott Rakes, MD  amLODipine (NORVASC) 10 MG tablet Take 1 tablet (10 mg total) by mouth daily. 05/10/18  Yes [name], MD  aspirin EC 81 MG tablet Take 1 tablet (81 mg total) by mouth daily. 01/22/16  Yes [name], Josalyn, MD  atorvastatin (LIPITOR) 40 MG tablet Take 1 tablet (40 mg total) by mouth at bedtime. 11/21/17  Yes [name], MD  carvedilol (COREG) 25 MG tablet Take 1.5 tablets (37.5 mg total) by mouth 2  (two) times daily with a meal. 07/06/18  Yes Patsey Berthold, NP  PRESCRIPTION MEDICATION Inhale into the lungs at bedtime. CPAP   Yes [provider]  torsemide (DEMADEX) 20 MG tablet Take 40 mg by mouth daily.   Yes [provider]  azithromycin (ZITHROMAX) 250 MG tablet Take 2 today then 1 daily 07/27/18   [name], PA-C  cyclobenzaprine (FLEXERIL) 10 MG tablet Take 1 tablet (10 mg total) by mouth at bedtime. Prn pain 07/27/18   [name], PA-C  metolazone (ZAROXOLYN) 5 MG tablet Take 0.5 tablets (2.5 mg total) by mouth daily. Take 1 tablet (2.5 mg total) by mouth daily 30 minutes before you take your fluid pill. 04/03/18   Ward, Ozella Almond, PA-C  nitroGLYCERIN (NITROSTAT) 0.4 MG SL tablet Place 1 tablet (0.4 mg total) under the tongue every 5 (five) minutes x 3 doses as needed for chest pain. Patient not taking: Reported on 07/06/2018 03/06/16   Charolette Forward, MD     Objective:  EXAM:   Vitals:   07/27/18 0923  BP: 122/75  Pulse: 72  Resp: 18  Temp: 98.2 F (36.8 C)  TempSrc: Oral  SpO2: 99%  Weight: 291 lb (132 kg)  Height: 5\' 7"  (1.702 m)    General appearance : A&OX3. NAD. Non-toxic-appearing HEENT: Atraumatic and Normocephalic.  PERRLA. EOM intact.  TM clear B. Mouth-MMM, post pharynx WNL w/o erythema, + PND. Neck: supple, no JVD. No cervical lymphadenopathy. No thyromegaly Chest/Lungs:  Breathing-non-labored, Good air entry bilaterally, breath sounds normal without rales, rhonchi, or wheezing  CVS: S1 S2 regular, no murmurs, gallops, rubs  Extremities: Bilateral Lower Ext shows no edema, both legs are warm to touch with = pulse throughout Neurology:  CN II-XII grossly intact, Non focal.   Psych:  TP linear. J/I WNL. Normal speech. Appropriate eye contact and affect.  Skin:  No Rash  Data Review Lab Results  Component Value Date   HGBA1C 5.90 12/22/2015     Assessment & Plan   1. Hemoptysis - CBC with Differential/Platelet - DG  Chest 2 View; Future  2. Chronic midline low back pain without sciatica He can take tylenol.  Check with Dr Joelyn Oms about anti-inflammatories - cyclobenzaprine (FLEXERIL) 10 MG tablet; Take 1 tablet (10 mg total) by mouth at bedtime. Prn pain  Dispense: 30 tablet; Refill: 0  3. Bronchitis Cover for atypicals.  Xray due to bloody mucus - azithromycin (ZITHROMAX) 250 MG tablet; Take 2 today then 1 daily  Dispense: 6 tablet; Refill: 0 - DG Chest 2 View; Future  4. Essential hypertension Controlled. Continue current regimen - Basic metabolic panel  5. CKD (chronic kidney disease) stage 4, GFR 15-29 ml/min (HCC) See Dr Joelyn Oms next month - Basic metabolic panel     Patient have been counseled extensively about nutrition and exercise  Return in about 2 months (around 09/26/2018)  for Dr Lianne Cure; f/up hip/back pain and htn.  The patient was given clear instructions to go to ER or return to medical center if symptoms don't improve, worsen or new problems develop. The patient verbalized understanding. The patient was told to call to get lab results if they haven't heard anything in the next week.     Freeman Caldron, PA-C [facility] [place], [place]   07/27/2018, 9:51 AM

## 2018-07-28 LAB — CBC WITH DIFFERENTIAL/PLATELET
Basophils Absolute: 0 10*3/uL (ref 0.0–0.2)
Basos: 0 %
EOS (ABSOLUTE): 0.3 10*3/uL (ref 0.0–0.4)
Eos: 6 %
Hematocrit: 35.1 % — ABNORMAL LOW (ref 37.5–51.0)
Hemoglobin: 11.9 g/dL — ABNORMAL LOW (ref 13.0–17.7)
Immature Grans (Abs): 0 10*3/uL (ref 0.0–0.1)
Immature Granulocytes: 0 %
Lymphocytes Absolute: 1.1 10*3/uL (ref 0.7–3.1)
Lymphs: 22 %
MCH: 29.8 pg (ref 26.6–33.0)
MCHC: 33.9 g/dL (ref 31.5–35.7)
MCV: 88 fL (ref 79–97)
Monocytes Absolute: 0.5 10*3/uL (ref 0.1–0.9)
Monocytes: 10 %
Neutrophils Absolute: 3 10*3/uL (ref 1.4–7.0)
Neutrophils: 62 %
Platelets: 205 10*3/uL (ref 150–450)
RBC: 3.99 x10E6/uL — ABNORMAL LOW (ref 4.14–5.80)
RDW: 16.1 % — ABNORMAL HIGH (ref 12.3–15.4)
WBC: 4.9 10*3/uL (ref 3.4–10.8)

## 2018-07-28 LAB — BASIC METABOLIC PANEL
BUN/Creatinine Ratio: 8 — ABNORMAL LOW (ref 9–20)
BUN: 39 mg/dL — ABNORMAL HIGH (ref 6–24)
CO2: 22 mmol/L (ref 20–29)
Calcium: 8.5 mg/dL — ABNORMAL LOW (ref 8.7–10.2)
Chloride: 107 mmol/L — ABNORMAL HIGH (ref 96–106)
Creatinine, Ser: 4.97 mg/dL — ABNORMAL HIGH (ref 0.76–1.27)
GFR calc Af Amer: 14 mL/min/{1.73_m2} — ABNORMAL LOW (ref >59)
GFR calc non Af Amer: 12 mL/min/{1.73_m2} — ABNORMAL LOW (ref >59)
Glucose: 100 mg/dL — ABNORMAL HIGH (ref 65–99)
Potassium: 4 mmol/L (ref 3.5–5.2)
Sodium: 146 mmol/L — ABNORMAL HIGH (ref 134–144)

## 2018-07-31 ENCOUNTER — Telehealth: Payer: Self-pay | Admitting: *Deleted

## 2018-07-31 NOTE — Telephone Encounter (Signed)
-----   Message from [name], [place] sent at 07/28/2018  5:03 PM EDT ----- Please call patient.  His kidney function is impaired and unchanged as we already knew.  His blood count is stable.  Follow-up as planned.  Thanks, Freeman Caldron, PA-C

## 2018-07-31 NOTE — Telephone Encounter (Signed)
Patient verified DOB Patient is aware of kidney function being impaired but unchanged, patient is aware of blood count being stable and to follow up as planned. No further questions at this time.

## 2018-08-16 MED FILL — CALCITRIOL 0.5 MCG CAPS: 0.5 | 30 days supply | Qty: 30 | Fill #0

## 2018-08-21 ENCOUNTER — Other Ambulatory Visit: Payer: Self-pay

## 2018-08-21 ENCOUNTER — Encounter: Payer: Self-pay | Admitting: Internal Medicine

## 2018-08-21 DIAGNOSIS — N184 Chronic kidney disease, stage 4 (severe): Secondary | ICD-10-CM

## 2018-08-21 NOTE — Progress Notes (Signed)
Received note from Dr. Joelyn Oms from [facility].  Patient seen 08/16/2018.  Labs reviewed.  Hemoglobin 11.5.  Phosphorus 4.8, calcium 8.4, BUN 38 creatinine 4.9 and GFR 14.  Patient was to continue torsemide 20 mg 2 tablets daily, carvedilol 25 mg twice a day and amlodipine 10 mg daily.  Patient on vitamin D3 5000 units daily.

## 2018-08-23 ENCOUNTER — Other Ambulatory Visit: Payer: Self-pay

## 2018-08-23 ENCOUNTER — Emergency Department (HOSPITAL_COMMUNITY)
Admission: EM | Admit: 2018-08-23 | Discharge: 2018-08-23 | Disposition: A | Discharge status: Home / Self Care | Payer: BLUE CROSS/BLUE SHIELD | Attending: Emergency Medicine | Admitting: Emergency Medicine

## 2018-08-23 ENCOUNTER — Emergency Department (HOSPITAL_COMMUNITY): Payer: BLUE CROSS/BLUE SHIELD

## 2018-08-23 ENCOUNTER — Encounter (HOSPITAL_COMMUNITY): Payer: Self-pay

## 2018-08-23 ENCOUNTER — Encounter: Payer: BLUE CROSS/BLUE SHIELD | Admitting: Vascular Surgery

## 2018-08-23 ENCOUNTER — Encounter (HOSPITAL_COMMUNITY): Payer: BLUE CROSS/BLUE SHIELD

## 2018-08-23 ENCOUNTER — Other Ambulatory Visit (HOSPITAL_COMMUNITY): Payer: BLUE CROSS/BLUE SHIELD

## 2018-08-23 DIAGNOSIS — Z79899 Other long term (current) drug therapy: Secondary | ICD-10-CM | POA: Diagnosis not present

## 2018-08-23 DIAGNOSIS — I5042 Chronic combined systolic (congestive) and diastolic (congestive) heart failure: Secondary | ICD-10-CM | POA: Diagnosis not present

## 2018-08-23 DIAGNOSIS — N184 Chronic kidney disease, stage 4 (severe): Secondary | ICD-10-CM | POA: Insufficient documentation

## 2018-08-23 DIAGNOSIS — I130 Hypertensive heart and chronic kidney disease with heart failure and stage 1 through stage 4 chronic kidney disease, or unspecified chronic kidney disease: Secondary | ICD-10-CM | POA: Diagnosis not present

## 2018-08-23 DIAGNOSIS — R0600 Dyspnea, unspecified: Secondary | ICD-10-CM | POA: Diagnosis not present

## 2018-08-23 DIAGNOSIS — J441 Chronic obstructive pulmonary disease with (acute) exacerbation: Secondary | ICD-10-CM | POA: Diagnosis not present

## 2018-08-23 DIAGNOSIS — R0602 Shortness of breath: Secondary | ICD-10-CM | POA: Diagnosis present

## 2018-08-23 DIAGNOSIS — Z7982 Long term (current) use of aspirin: Secondary | ICD-10-CM | POA: Insufficient documentation

## 2018-08-23 DIAGNOSIS — I10 Essential (primary) hypertension: Secondary | ICD-10-CM

## 2018-08-23 LAB — CBC
HCT: 36.8 % — ABNORMAL LOW (ref 39.0–52.0)
Hemoglobin: 11.7 g/dL — ABNORMAL LOW (ref 13.0–17.0)
MCH: 29.8 pg (ref 26.0–34.0)
MCHC: 31.8 g/dL (ref 30.0–36.0)
MCV: 93.6 fL (ref 78.0–100.0)
Platelets: 176 10*3/uL (ref 150–400)
RBC: 3.93 MIL/uL — ABNORMAL LOW (ref 4.22–5.81)
RDW: 14.8 % (ref 11.5–15.5)
WBC: 4.8 10*3/uL (ref 4.0–10.5)

## 2018-08-23 LAB — BASIC METABOLIC PANEL
Anion gap: 13 (ref 5–15)
BUN: 35 mg/dL — ABNORMAL HIGH (ref 6–20)
CO2: 19 mmol/L — ABNORMAL LOW (ref 22–32)
Calcium: 7.8 mg/dL — ABNORMAL LOW (ref 8.9–10.3)
Chloride: 110 mmol/L (ref 98–111)
Creatinine, Ser: 5.03 mg/dL — ABNORMAL HIGH (ref 0.61–1.24)
GFR calc Af Amer: 14 mL/min — ABNORMAL LOW (ref >60)
GFR calc non Af Amer: 12 mL/min — ABNORMAL LOW (ref >60)
Glucose, Bld: 144 mg/dL — ABNORMAL HIGH (ref 70–99)
Potassium: 4 mmol/L (ref 3.5–5.1)
Sodium: 142 mmol/L (ref 135–145)

## 2018-08-23 LAB — TROPONIN I: Troponin I: 0.04 ng/mL (ref <0.03)

## 2018-08-23 LAB — BRAIN NATRIURETIC PEPTIDE: B Natriuretic Peptide: 576.8 pg/mL — ABNORMAL HIGH (ref 0.0–100.0)

## 2018-08-23 MED ORDER — CARVEDILOL 25 MG PO TABS
25.0000 mg | ORAL_TABLET | Freq: Two times a day (BID) | ORAL | 5 refills | Status: DC
Start: 2018-08-23 — End: 2019-06-28

## 2018-08-23 MED FILL — CARVEDILOL 25 MG TABLET: 25 | 30 days supply | Qty: 60 | Fill #0

## 2018-08-23 NOTE — ED Triage Notes (Signed)
Pt states that he has had SOB for the past month, getting worse, feels he has fluid in his lungs, hx of CHF, denies CP, reports coughing up blood for the past two weeks.

## 2018-08-23 NOTE — Discharge Instructions (Addendum)
Please call your cardiologist for follow up  Change your Carvedilol back to 1 pill in the morning and 1 pill in the evening  Please follow-up with the vascular surgeons as recommended by your nephrologist

## 2018-08-23 NOTE — ED Provider Notes (Signed)
[facility] EMERGENCY DEPARTMENT Provider Note   CSN: [id number] Arrival date & time: 08/23/18  6294     History   Chief Complaint Chief Complaint  Patient presents with  . Shortness of Breath    HPI Victor Thompson is a 57 y.o. male.  HPI 57 year old male with a history of congestive heart failure and chronic kidney disease presents the emergency department with increasing exertional shortness of breath over the past 3 to 4 weeks.  He feels like it is getting worse.  He recently had his carvedilol increased after he had an episode of VT that was shocked by his AICD.  He went from 25 mg twice a day to 37.5 mg twice a day.  This seems to coincide in terms of when his symptoms began.  Denies significant changes in his diet.  Denies orthopnea.  No fevers or chills.  Still has some mild ongoing productive cough with occasional hemoptysis.  No history of DVT or pulmonary embolism.  No unilateral leg swelling.  Reports baseline mild edema in his legs.  No fevers or chills.  No shortness of breath at rest.  No chest discomfort at this time.  Symptoms are mild to moderate in severity.   Past Medical History:  Diagnosis Date  . AICD (automatic cardioverter/defibrillator) present   . Cardiac arrest ([place])   . CHF (congestive heart failure) ([place])   . Coronary artery disease   . Hypertension   . Obesity   . Pulmonary edema   . Shortness of breath dyspnea   . Sleep apnea    USES CPAP    Patient Active Problem List   Diagnosis Date Noted  . Chronic midline low back pain without sciatica 02/17/2018  . CKD (chronic kidney disease) stage 4, GFR 15-29 ml/min (HCC) 11/23/2017  . Angioedema 04/09/2017  . COPD exacerbation ([place]) 03/04/2016  . Cardiac arrest with ventricular fibrillation ([place]) 03/04/2016  . Defibrillator discharge   . Chronic combined systolic and diastolic CHF (congestive heart failure) ([place]) 01/22/2016  . Erectile dysfunction 01/22/2016  . Allergic rhinitis  01/22/2016  . GERD (gastroesophageal reflux disease) 01/22/2016  . PROTEINURIA 02/17/2010  . Cardiomyopathy, ischemic 02/16/2010  . Obstructive sleep apnea 02/16/2010  . EXTERNAL HEMORRHOIDS 09/26/2009  . Tobacco use disorder in remission 06/03/2009  . Essential hypertension 10/01/2008  . Hyperlipidemia 07/14/2007  . OBESITY 07/14/2007  . Coronary atherosclerosis 07/14/2007  . Asthma 07/14/2007    Past Surgical History:  Procedure Laterality Date  . CORONARY STENT PLACEMENT    . IMPLANTABLE CARDIOVERTER DEFIBRILLATOR IMPLANT          Home Medications    Prior to Admission medications   Medication Sig Start Date End Date Taking? Authorizing Provider  albuterol (PROVENTIL HFA;VENTOLIN HFA) 108 (90 Base) MCG/ACT inhaler Inhale 2 puffs into the lungs every 4 (four) hours as needed for wheezing or shortness of breath. 02/06/16  Yes Charlott Rakes, MD  amLODipine (NORVASC) 10 MG tablet Take 1 tablet (10 mg total) by mouth daily. 05/10/18  Yes [name], MD  aspirin EC 81 MG tablet Take 1 tablet (81 mg total) by mouth daily. 01/22/16  Yes [name], Josalyn, MD  atorvastatin (LIPITOR) 40 MG tablet Take 1 tablet (40 mg total) by mouth at bedtime. 11/21/17  Yes [name], MD  calcitRIOL (ROCALTROL) 0.25 MCG capsule Take 0.25 mcg by mouth daily.  08/16/18  Yes [provider]  carvedilol (COREG) 25 MG tablet Take 1.5 tablets (37.5 mg total) by mouth  2 (two) times daily with a meal. 07/06/18  Yes Seiler, [employer] K, NP  cyclobenzaprine (FLEXERIL) 10 MG tablet Take 1 tablet (10 mg total) by mouth at bedtime. Prn pain 07/27/18  Yes [name], PA-C  EPINEPHrine 0.3 mg/0.3 mL IJ SOAJ injection Inject 0.3 mLs into the muscle once. 02/06/16  Yes [provider]  [place] into the lungs at bedtime. CPAP   Yes [provider]  torsemide (DEMADEX) 20 MG tablet Take 40 mg by mouth daily.   Yes [provider]  metolazone (ZAROXOLYN)  5 MG tablet Take 0.5 tablets (2.5 mg total) by mouth daily. Take 1 tablet (2.5 mg total) by mouth daily 30 minutes before you take your fluid pill. Patient not taking: Reported on 08/23/2018 04/03/18   Ward, Ozella Almond, PA-C  nitroGLYCERIN (NITROSTAT) 0.4 MG SL tablet Place 1 tablet (0.4 mg total) under the tongue every 5 (five) minutes x 3 doses as needed for chest pain. Patient not taking: Reported on 08/23/2018 03/06/16   Charolette Forward, MD    Family History Family History  Problem Relation Age of Onset  . Hypertension Mother     Social History Social History   Tobacco Use  . Smoking status: Never Smoker  . Smokeless tobacco: Never Used  Substance Use Topics  . Alcohol use: No  . Drug use: No     Allergies   Influenza vaccines; Ketorolac; Ace inhibitors; Ace inhibitors; Lidocaine; Lidocaine hcl; Lisinopril; Lisinopril; Penicillins; and Penicillins   Review of Systems Review of Systems  All other systems reviewed and are negative.    Physical Exam Updated Vital Signs BP 124/84   Pulse 76   Temp (!) 97 F (36.1 C) (Oral)   Resp 20   SpO2 98%   Physical Exam  Constitutional: He is oriented to person, place, and time. He appears well-developed and well-nourished.  HENT:  Head: Normocephalic and atraumatic.  Eyes: EOM are normal.  Neck: Normal range of motion.  Cardiovascular: Normal rate, regular rhythm, normal heart sounds and intact distal pulses.  Pulmonary/Chest: Effort normal and breath sounds normal. No respiratory distress.  Abdominal: Soft. He exhibits no distension. There is no tenderness.  Musculoskeletal: Normal range of motion.       Right lower leg: He exhibits edema.  Neurological: He is alert and oriented to person, place, and time.  Skin: Skin is warm and dry.  Psychiatric: He has a normal mood and affect. Judgment normal.  Nursing note and vitals reviewed.    ED Treatments / Results  Labs (all labs ordered are listed, but only abnormal  results are displayed) Labs Reviewed  CBC - Abnormal; Notable for the following components:      Result Value   RBC 3.93 (*)    Hemoglobin 11.7 (*)    HCT 36.8 (*)    All other components within normal limits  BASIC METABOLIC PANEL - Abnormal; Notable for the following components:   CO2 19 (*)    Glucose, Bld 144 (*)    BUN 35 (*)    Creatinine, Ser 5.03 (*)    Calcium 7.8 (*)    GFR calc non Af Amer 12 (*)    GFR calc Af Amer 14 (*)    All other components within normal limits  BRAIN NATRIURETIC PEPTIDE - Abnormal; Notable for the following components:   B Natriuretic Peptide 576.8 (*)    All other components within normal limits  TROPONIN I - Abnormal; Notable for the  following components:   Troponin I 0.04 (*)    All other components within normal limits    EKG EKG Interpretation  Date/Time:  Wednesday August 23 2018 06:34:31 EDT Ventricular Rate:  71 PR Interval:  180 QRS Duration: 134 QT Interval:  478 QTC Calculation: 519 R Axis:   -59 Text Interpretation:  Sinus rhythm with Premature atrial complexes Left axis deviation Non-specific intra-ventricular conduction block Abnormal QRS-T angle, consider primary T wave abnormality Abnormal ECG No significant change was found Confirmed by Jola Schmidt [phone]) on 08/23/2018 7:28:59 AM   Radiology Dg Chest 2 View  Result Date: 08/23/2018 CLINICAL DATA:  Shortness of breath EXAM: CHEST - 2 VIEW COMPARISON:  April 03, 2018 FINDINGS: Defibrillator lead tip overlies the manubrium anteriorly. No edema or consolidation. Heart is mildly enlarged with pulmonary vascularity normal. No adenopathy. There is an old fracture of the left posterolateral eighth rib. No pneumothorax. IMPRESSION: Stable defibrillator lead placement. No edema or consolidation. Stable cardiac prominence. Electronically Signed   By: Lowella Grip III M.D.   On: 08/23/2018 07:11    Procedures Procedures (including critical care time)  Medications Ordered  in ED Medications - No data to display   Initial Impression / Assessment and Plan / ED Course  I have reviewed the triage vital signs and the nursing notes.  Pertinent labs & imaging results that were available during my care of the patient were reviewed by me and considered in my medical decision making (see chart for details).     Overall the patient is well-appearing.  I suspect this is secondary to his increased carvedilol.  Work-up in the emergency department is without significant abnormality.  Baseline renal insufficiency.  I have encouraged him to continue following up with his nephrologist.  I have encouraged vascular surgery consultation for AV fistula placement.  He likely will need dialysis in the coming months.  He will monitor his weight daily.  We will decrease his carvedilol back to 25 mg twice a day and see if this makes a difference.  Close primary care follow-up.  He will need outpatient echocardiogram repeated as well.  Patient is encouraged to return to the ER for new or worsening symptoms   Final Clinical Impressions(s) / ED Diagnoses   Final diagnoses:  Dyspnea, unspecified type    ED Discharge Orders    None       Jola Schmidt, MD 08/23/18 1030

## 2018-09-06 ENCOUNTER — Ambulatory Visit: Payer: BLUE CROSS/BLUE SHIELD | Admitting: Vascular Surgery

## 2018-09-06 ENCOUNTER — Ambulatory Visit (INDEPENDENT_AMBULATORY_CARE_PROVIDER_SITE_OTHER)
Admission: RE | Admit: 2018-09-06 | Discharge: 2018-09-06 | Disposition: A | Discharge status: Home / Self Care | Payer: BLUE CROSS/BLUE SHIELD | Source: Ambulatory Visit | Attending: Vascular Surgery | Admitting: Vascular Surgery

## 2018-09-06 ENCOUNTER — Other Ambulatory Visit: Payer: Self-pay

## 2018-09-06 ENCOUNTER — Ambulatory Visit (HOSPITAL_COMMUNITY)
Admission: RE | Admit: 2018-09-06 | Discharge: 2018-09-06 | Disposition: A | Discharge status: Home / Self Care | Payer: BLUE CROSS/BLUE SHIELD | Source: Ambulatory Visit | Attending: Vascular Surgery | Admitting: Vascular Surgery

## 2018-09-06 ENCOUNTER — Encounter: Payer: Self-pay | Admitting: Vascular Surgery

## 2018-09-06 VITALS — BP 146/89 | HR 70 | Temp 98.5°F | Resp 16 | Ht 67.0 in | Wt 287.0 lb

## 2018-09-06 DIAGNOSIS — N184 Chronic kidney disease, stage 4 (severe): Secondary | ICD-10-CM

## 2018-09-06 DIAGNOSIS — E785 Hyperlipidemia, unspecified: Secondary | ICD-10-CM | POA: Insufficient documentation

## 2018-09-06 DIAGNOSIS — R936 Abnormal findings on diagnostic imaging of limbs: Secondary | ICD-10-CM | POA: Diagnosis not present

## 2018-09-06 DIAGNOSIS — I129 Hypertensive chronic kidney disease with stage 1 through stage 4 chronic kidney disease, or unspecified chronic kidney disease: Secondary | ICD-10-CM | POA: Diagnosis not present

## 2018-09-06 DIAGNOSIS — Z01818 Encounter for other preprocedural examination: Secondary | ICD-10-CM | POA: Diagnosis present

## 2018-09-06 DIAGNOSIS — I2510 Atherosclerotic heart disease of native coronary artery without angina pectoris: Secondary | ICD-10-CM | POA: Insufficient documentation

## 2018-09-06 NOTE — Progress Notes (Signed)
REASON FOR CONSULT:    To evaluate for hemodialysis access.  The consult is requested by Dr. Pearson Grippe.  HPI:   Victor Thompson is a pleasant 57 y.o. male, who is referred for evaluation of hemodialysis access.  Patient has stage V chronic kidney disease.  Patient does have some fatigue but denies any other uremic symptoms.  Specifically, he denies nausea, vomiting, anorexia, or palpitations.  He is left-handed.  He has an AICD on the left side.  I have reviewed the records that were sent from the referring office.  The patient was seen on 08/16/2018 with stage V chronic kidney disease.  GFR was less than 15.  AV fistula evaluation was recommended but apparently was declined as he felt overloaded.  It is felt that the kidney disease is likely related to hypertension.  This is been slowly progressive over time.  Of note, if we are unable to place an AV fistula, we were asked to wait on placement of an AV graft.  He works as a Horticulturist, commercial.  Past Medical History:  Diagnosis Date  . AICD (automatic cardioverter/defibrillator) present   . Cardiac arrest ([place])   . CHF (congestive heart failure) ([place])   . Coronary artery disease   . Hypertension   . Obesity   . Pulmonary edema   . Shortness of breath dyspnea   . Sleep apnea    USES CPAP    Family History  Problem Relation Age of Onset  . Hypertension Mother     SOCIAL HISTORY: Social History   Socioeconomic History  . Marital status: Married    Spouse name: Not on file  . Number of children: Not on file  . Years of education: Not on file  . Highest education level: Not on file  Occupational History  . Not on file  Social Needs  . Financial resource strain: Not on file  . Food insecurity:    Worry: Not on file    Inability: Not on file  . Transportation needs:    Medical: Not on file    Non-medical: Not on file  Tobacco Use  . Smoking status: Never Smoker  . Smokeless tobacco: Never Used  Substance and Sexual  Activity  . Alcohol use: No  . Drug use: No  . Sexual activity: Yes  Lifestyle  . Physical activity:    Days per week: Not on file    Minutes per session: Not on file  . Stress: Not on file  Relationships  . Social connections:    Talks on phone: Not on file    Gets together: Not on file    Attends religious service: Not on file    Active member of club or organization: Not on file    Attends meetings of clubs or organizations: Not on file    Relationship status: Not on file  . Intimate partner violence:    Fear of current or ex partner: Not on file    Emotionally abused: Not on file    Physically abused: Not on file    Forced sexual activity: Not on file  Other Topics Concern  . Not on file  Social History Narrative   ** Merged History Encounter **        Allergies  Allergen Reactions  . Influenza Vaccines Hives and Swelling  . Ketorolac Swelling  . Ace Inhibitors Swelling  . Ace Inhibitors Swelling  . Lidocaine Swelling    TONGUE SWELLS  . Lidocaine Hcl Swelling  tongue swelling  . Lisinopril Swelling    REACTION: tongue swelling.Pt reported problem with a BP med which sounded like  lisinopril  But as of 09/19/06,pt had tolerated altace without problem  . Lisinopril Swelling    TONGUE SWELLS  . Penicillins Swelling    Tongue swelling - cannot take any "cillins" Has patient had a PCN reaction causing immediate rash, facial/tongue/throat swelling, SOB or lightheadedness with hypotension: Yes Has patient had a PCN reaction causing severe rash involving mucus membranes or skin necrosis: No Has patient had a PCN reaction that required hospitalization No Has patient had a PCN reaction occurring within the last 10 years: Yes If all of the above answers are "NO", then may proceed with Cephalosporin use.  Marland Kitchen Penicillins Swelling    TONGUE SWELLS Has patient had a PCN reaction causing immediate rash, facial/tongue/throat swelling, SOB or lightheadedness with  hypotension: Yes Has patient had a PCN reaction causing severe rash involving mucus membranes or skin necrosis: No Has patient had a PCN reaction that required hospitalization No Has patient had a PCN reaction occurring within the last 10 years: Yes If all of the above answers are "NO", then may proceed with Cephalosporin use.     Current Outpatient Medications  Medication Sig Dispense Refill  . albuterol (PROVENTIL HFA;VENTOLIN HFA) 108 (90 Base) MCG/ACT inhaler Inhale 2 puffs into the lungs every 4 (four) hours as needed for wheezing or shortness of breath. 54 g 3  . amLODipine (NORVASC) 10 MG tablet Take 1 tablet (10 mg total) by mouth daily. 30 tablet 3  . aspirin EC 81 MG tablet Take 1 tablet (81 mg total) by mouth daily. 30 tablet 5  . atorvastatin (LIPITOR) 40 MG tablet Take 1 tablet (40 mg total) by mouth at bedtime. 30 tablet 6  . calcitRIOL (ROCALTROL) 0.25 MCG capsule Take 0.25 mcg by mouth daily.   11  . carvedilol (COREG) 25 MG tablet Take 1 tablet (25 mg total) by mouth 2 (two) times daily with a meal. 60 tablet 5  . cyclobenzaprine (FLEXERIL) 10 MG tablet Take 1 tablet (10 mg total) by mouth at bedtime. Prn pain 30 tablet 0  . EPINEPHrine 0.3 mg/0.3 mL IJ SOAJ injection Inject 0.3 mLs into the muscle once.    . metolazone (ZAROXOLYN) 5 MG tablet Take 0.5 tablets (2.5 mg total) by mouth daily. Take 1 tablet (2.5 mg total) by mouth daily 30 minutes before you take your fluid pill. 3 tablet 0  . nitroGLYCERIN (NITROSTAT) 0.4 MG SL tablet Place 1 tablet (0.4 mg total) under the tongue every 5 (five) minutes x 3 doses as needed for chest pain. 25 tablet 12  . PRESCRIPTION MEDICATION Inhale into the lungs at bedtime. CPAP    . torsemide (DEMADEX) 20 MG tablet Take 40 mg by mouth daily.     No current facility-administered medications for this visit.     REVIEW OF SYSTEMS:  [X]  denotes positive finding, [ ]  denotes negative finding Cardiac  Comments:  Chest pain or chest  pressure:    Shortness of breath upon exertion:    Short of breath when lying flat:    Irregular heart rhythm:        Vascular    Pain in calf, thigh, or hip brought on by ambulation:    Pain in feet at night that wakes you up from your sleep:     Blood clot in your veins:    Leg swelling:  Pulmonary    Oxygen at home:    Productive cough:     Wheezing:         Neurologic    Sudden weakness in arms or legs:     Sudden numbness in arms or legs:     Sudden onset of difficulty speaking or slurred speech:    Temporary loss of vision in one eye:     Problems with dizziness:         Gastrointestinal    Blood in stool:     Vomited blood:         Genitourinary    Burning when urinating:     Blood in urine:        Psychiatric    Victor depression:         Hematologic    Bleeding problems:    Problems with blood clotting too easily:        Skin    Rashes or ulcers:        Constitutional    Fever or chills:     PHYSICAL EXAM:   Vitals:   09/06/18 1432 09/06/18 1436  BP: (!) 143/92 (!) 146/89  Pulse: 70 70  Resp: 16   Temp: 98.5 F (36.9 C)   TempSrc: Oral   SpO2: 97%   Weight: 287 lb (130.2 kg)   Height: 5\' 7"  (1.702 m)     GENERAL: The patient is a well-nourished male, in no acute distress. The vital signs are documented above. CARDIAC: There is a regular rate and rhythm.  VASCULAR: I do not detect carotid bruits. He has radial pulses bilaterally. PULMONARY: There is good air exchange bilaterally without wheezing or rales. ABDOMEN: Soft and non-tender with normal pitched bowel sounds.  MUSCULOSKELETAL: There are no Victor deformities or cyanosis. NEUROLOGIC: No focal weakness or paresthesias are detected. SKIN: There are no ulcers or rashes noted. PSYCHIATRIC: The patient has a normal affect.  DATA:    UPPER EXTREMITY VEIN MAP: I have independently interpreted the patient's upper extremity vein map.  On the right sid the forearm and upper arm  cephalic vein looks reasonable in size.  The basilic vein looks reasonable in size.  On the left side the forearm cephalic vein looks small.  The upper arm cephalic vein is small at the antecubital level and distal upper arm but might potentially be usable.  The basilic vein looks reasonable in size.  UPPER EXTREMITY ARTERIAL DUPLEX: I have independently interpreted the upper extremity arterial duplex scan.  On the right side there is a triphasic radial and ulnar waveform.  Brachial artery measures 0.66 cm in diameter.  On the left side there is a triphasic radial and ulnar waveform.  The brachial artery measures 0.57 cm in diameter.   ASSESSMENT & PLAN:   STAGE V CHRONIC KIDNEY DISEASE: Based on his vein map I think he is a good candidate for an AV fistula in the right arm.  He is left-handed.  In addition, he has an AICD on the left side would be reluctant to place access in the left arm initially.  I had a long discussion with him today about the options of an AV fistula versus an AV graft.  I explained that we have been asked to only place an AV fistula and wait arm AV graft if the fistula is not possible.  However I think he is a good candidate for an AV fistula.  He has multiple concerns and tells me that he  simply cannot get his head around all of this currently and does not want to schedule surgery.  Certainly would be willing to schedule placement of an AV fistula once he is agreeable.    Deitra Mayo [facility] [phone]

## 2018-09-06 NOTE — Progress Notes (Signed)
Vitals:   09/06/18 1432  BP: (!) 143/92  Pulse: 70  Resp: 16  Temp: 98.5 F (36.9 C)  TempSrc: Oral  SpO2: 97%  Weight: 287 lb (130.2 kg)  Height: 5\' 7"  (1.702 m)

## 2018-09-26 ENCOUNTER — Encounter: Payer: Self-pay | Admitting: Internal Medicine

## 2018-09-26 ENCOUNTER — Ambulatory Visit: Payer: BLUE CROSS/BLUE SHIELD | Attending: Internal Medicine | Admitting: Internal Medicine

## 2018-09-26 VITALS — BP 149/103 | HR 81 | Temp 98.1°F | Resp 16 | Wt 290.2 lb

## 2018-09-26 DIAGNOSIS — I2510 Atherosclerotic heart disease of native coronary artery without angina pectoris: Secondary | ICD-10-CM | POA: Insufficient documentation

## 2018-09-26 DIAGNOSIS — Z8674 Personal history of sudden cardiac arrest: Secondary | ICD-10-CM | POA: Insufficient documentation

## 2018-09-26 DIAGNOSIS — N185 Chronic kidney disease, stage 5: Secondary | ICD-10-CM | POA: Diagnosis not present

## 2018-09-26 DIAGNOSIS — Z1211 Encounter for screening for malignant neoplasm of colon: Secondary | ICD-10-CM

## 2018-09-26 DIAGNOSIS — J449 Chronic obstructive pulmonary disease, unspecified: Secondary | ICD-10-CM | POA: Insufficient documentation

## 2018-09-26 DIAGNOSIS — K219 Gastro-esophageal reflux disease without esophagitis: Secondary | ICD-10-CM | POA: Insufficient documentation

## 2018-09-26 DIAGNOSIS — E669 Obesity, unspecified: Secondary | ICD-10-CM | POA: Insufficient documentation

## 2018-09-26 DIAGNOSIS — M47816 Spondylosis without myelopathy or radiculopathy, lumbar region: Secondary | ICD-10-CM | POA: Insufficient documentation

## 2018-09-26 DIAGNOSIS — G4733 Obstructive sleep apnea (adult) (pediatric): Secondary | ICD-10-CM | POA: Insufficient documentation

## 2018-09-26 DIAGNOSIS — I5042 Chronic combined systolic (congestive) and diastolic (congestive) heart failure: Secondary | ICD-10-CM | POA: Insufficient documentation

## 2018-09-26 DIAGNOSIS — M545 Low back pain: Secondary | ICD-10-CM | POA: Insufficient documentation

## 2018-09-26 DIAGNOSIS — Z880 Allergy status to penicillin: Secondary | ICD-10-CM | POA: Insufficient documentation

## 2018-09-26 DIAGNOSIS — I10 Essential (primary) hypertension: Secondary | ICD-10-CM

## 2018-09-26 DIAGNOSIS — Z7982 Long term (current) use of aspirin: Secondary | ICD-10-CM | POA: Insufficient documentation

## 2018-09-26 DIAGNOSIS — Z888 Allergy status to other drugs, medicaments and biological substances status: Secondary | ICD-10-CM | POA: Insufficient documentation

## 2018-09-26 DIAGNOSIS — I132 Hypertensive heart and chronic kidney disease with heart failure and with stage 5 chronic kidney disease, or end stage renal disease: Secondary | ICD-10-CM | POA: Insufficient documentation

## 2018-09-26 DIAGNOSIS — E785 Hyperlipidemia, unspecified: Secondary | ICD-10-CM | POA: Insufficient documentation

## 2018-09-26 DIAGNOSIS — Z79899 Other long term (current) drug therapy: Secondary | ICD-10-CM | POA: Insufficient documentation

## 2018-09-26 DIAGNOSIS — N529 Male erectile dysfunction, unspecified: Secondary | ICD-10-CM | POA: Insufficient documentation

## 2018-09-26 DIAGNOSIS — I255 Ischemic cardiomyopathy: Secondary | ICD-10-CM | POA: Insufficient documentation

## 2018-09-26 DIAGNOSIS — G8929 Other chronic pain: Secondary | ICD-10-CM | POA: Insufficient documentation

## 2018-09-26 MED ORDER — ATORVASTATIN CALCIUM 40 MG PO TABS: 40.0000 mg | ORAL_TABLET | Freq: Every day | ORAL | 6 refills | Status: DC

## 2018-09-26 MED ORDER — TORSEMIDE 20 MG PO TABS: 40.0000 mg | ORAL_TABLET | Freq: Every day | ORAL | 2 refills | Status: DC

## 2018-09-26 MED ORDER — AMLODIPINE BESYLATE 10 MG PO TABS: 10.0000 mg | ORAL_TABLET | Freq: Every day | ORAL | 3 refills | Status: DC

## 2018-09-26 NOTE — Patient Instructions (Signed)
Your blood pressure is elevated today.  Please take your blood pressure medications this morning as soon as possible.  Continue to limit salt in the foods.  I have printed some information below to help you with healthy eating habits.  You should schedule a follow-up appointment with your cardiologist.  Follow a Healthy [place] can do it! Limit sugary drinks.  Avoid sodas, sweet tea, sport or energy drinks, or fruit drinks.  Drink water, lo-fat milk, or diet drinks. Limit snack foods.   Cut back on candy, cake, cookies, chips, ice cream.  These are a special treat, only in small amounts. Eat plenty of vegetables.  Especially dark green, red, and orange vegetables. Aim for at least 3 servings a day. More is better! Include fruit in your daily diet.  Whole fruit is much healthier than fruit juice! Limit "white" bread, "white" pasta, "white" rice.   Choose "100% whole grain" products, [place] or wild rice. Avoid fatty meats. Try "Meatless Monday" and choose eggs or beans one day a week.  When eating meat, choose lean meats like chicken, [place], and fish.  Grill, broil, or bake meats instead of frying, and eat poultry without the skin. Eat less salt.  Avoid frozen pizzas, frozen dinners and salty foods.  Use seasonings other than salt in cooking.  This can help blood pressure and keep you from swelling Beer, wine and liquor have calories.  If you can safely drink alcohol, limit to 1 drink per day for women, 2 drinks for men

## 2018-09-26 NOTE — Progress Notes (Signed)
Patient ID: Victor Thompson, male    DOB: May 16, 1961  MRN: [id number]  CC: Hypertension  Subjective: Victor Thompson is a 57 y.o. male who presents for chronic disease management. His concerns today include:  Pt with hx of CKD stage 5, HTN, CAD (with hx of cardiac arrest and), ICD, combined sys/dia CHF, HL, Obese, COPD, chronic lower back pain with multilevel spondylosis  CKD stage 5: Patient is followed by Dr. Joelyn Oms of [facility].  He has stage V kidney disease.  He was referred to Dr. Doren Custard for placement of AV fistula but patient declined to have it placed.  He is having a hard time mentally excepting the fact that he will eventually end up with renal replacement therapy.  Wanting to know if it is okay for him to be on ASA given his   CAD/ICD/CHF: denies any CP. Some sob which he attributes to being over wgh.  + LE edema.  -no PND/orthopnea Did not take meds as yet for the morning.  No device to check BP. Limits salt in foods -seen in ER 1 mth ago for inc SOB -had episode of ICD firing 07/2018.  He has not seen cardiologist since then.  Cardiologist is Dr. Doylene Canard; last saw him in April  HL:  Compliant with Lipitor  Obesity: Wanting to lose weight.  He reports that sweet tea is his weakness.  He tries to eat a light lunch at work.  Weight is down 9 pounds compared to December of last year.  HM:  Requesting Rf for c-scope, last one 10/2008  Patient Active Problem List   Diagnosis Date Noted  . Chronic midline low back pain without sciatica 02/17/2018  . CKD (chronic kidney disease) stage 4, GFR 15-29 ml/min (HCC) 11/23/2017  . Angioedema 04/09/2017  . COPD exacerbation ([place]) 03/04/2016  . Cardiac arrest with ventricular fibrillation ([place]) 03/04/2016  . Defibrillator discharge   . Chronic combined systolic and diastolic CHF (congestive heart failure) ([place]) 01/22/2016  . Erectile dysfunction 01/22/2016  . Allergic rhinitis 01/22/2016  . GERD (gastroesophageal reflux  disease) 01/22/2016  . PROTEINURIA 02/17/2010  . Cardiomyopathy, ischemic 02/16/2010  . Obstructive sleep apnea 02/16/2010  . EXTERNAL HEMORRHOIDS 09/26/2009  . Tobacco use disorder in remission 06/03/2009  . Essential hypertension 10/01/2008  . Hyperlipidemia 07/14/2007  . OBESITY 07/14/2007  . Coronary atherosclerosis 07/14/2007  . Asthma 07/14/2007     Current Outpatient Medications on File Prior to Visit  Medication Sig Dispense Refill  . albuterol (PROVENTIL HFA;VENTOLIN HFA) 108 (90 Base) MCG/ACT inhaler Inhale 2 puffs into the lungs every 4 (four) hours as needed for wheezing or shortness of breath. 54 g 3  . aspirin EC 81 MG tablet Take 1 tablet (81 mg total) by mouth daily. 30 tablet 5  . calcitRIOL (ROCALTROL) 0.25 MCG capsule Take 0.25 mcg by mouth daily.   11  . carvedilol (COREG) 25 MG tablet Take 1 tablet (25 mg total) by mouth 2 (two) times daily with a meal. 60 tablet 5  . cyclobenzaprine (FLEXERIL) 10 MG tablet Take 1 tablet (10 mg total) by mouth at bedtime. Prn pain 30 tablet 0  . EPINEPHrine 0.3 mg/0.3 mL IJ SOAJ injection Inject 0.3 mLs into the muscle once.    . metolazone (ZAROXOLYN) 5 MG tablet Take 0.5 tablets (2.5 mg total) by mouth daily. Take 1 tablet (2.5 mg total) by mouth daily 30 minutes before you take your fluid pill. 3 tablet 0  . nitroGLYCERIN (NITROSTAT) 0.4 MG  SL tablet Place 1 tablet (0.4 mg total) under the tongue every 5 (five) minutes x 3 doses as needed for chest pain. 25 tablet 12  . PRESCRIPTION MEDICATION Inhale into the lungs at bedtime. CPAP     No current facility-administered medications on file prior to visit.     Allergies  Allergen Reactions  . Influenza Vaccines Hives and Swelling  . Ketorolac Swelling  . Ace Inhibitors Swelling  . Ace Inhibitors Swelling  . Lidocaine Swelling    TONGUE SWELLS  . Lidocaine Hcl Swelling     tongue swelling  . Lisinopril Swelling    REACTION: tongue swelling.Pt reported problem with a BP med  which sounded like  lisinopril  But as of 09/19/06,pt had tolerated altace without problem  . Lisinopril Swelling    TONGUE SWELLS  . Penicillins Swelling    Tongue swelling - cannot take any "cillins" Has patient had a PCN reaction causing immediate rash, facial/tongue/throat swelling, SOB or lightheadedness with hypotension: Yes Has patient had a PCN reaction causing severe rash involving mucus membranes or skin necrosis: No Has patient had a PCN reaction that required hospitalization No Has patient had a PCN reaction occurring within the last 10 years: Yes If all of the above answers are "NO", then may proceed with Cephalosporin use.  Marland Kitchen Penicillins Swelling    TONGUE SWELLS Has patient had a PCN reaction causing immediate rash, facial/tongue/throat swelling, SOB or lightheadedness with hypotension: Yes Has patient had a PCN reaction causing severe rash involving mucus membranes or skin necrosis: No Has patient had a PCN reaction that required hospitalization No Has patient had a PCN reaction occurring within the last 10 years: Yes If all of the above answers are "NO", then may proceed with Cephalosporin use.     Social History   Socioeconomic History  . Marital status: Married    Spouse name: Not on file  . Number of children: Not on file  . Years of education: Not on file  . Highest education level: Not on file  Occupational History  . Not on file  Social Needs  . Financial resource strain: Not on file  . Food insecurity:    Worry: Not on file    Inability: Not on file  . Transportation needs:    Medical: Not on file    Non-medical: Not on file  Tobacco Use  . Smoking status: Never Smoker  . Smokeless tobacco: Never Used  Substance and Sexual Activity  . Alcohol use: No  . Drug use: No  . Sexual activity: Yes  Lifestyle  . Physical activity:    Days per week: Not on file    Minutes per session: Not on file  . Stress: Not on file  Relationships  . Social  connections:    Talks on phone: Not on file    Gets together: Not on file    Attends religious service: Not on file    Active member of club or organization: Not on file    Attends meetings of clubs or organizations: Not on file    Relationship status: Not on file  . Intimate partner violence:    Fear of current or ex partner: Not on file    Emotionally abused: Not on file    Physically abused: Not on file    Forced sexual activity: Not on file  Other Topics Concern  . Not on file  Social History Narrative   ** Merged History Encounter **  Family History  Problem Relation Age of Onset  . Hypertension Mother     Past Surgical History:  Procedure Laterality Date  . CORONARY STENT PLACEMENT    . IMPLANTABLE CARDIOVERTER DEFIBRILLATOR IMPLANT      ROS: Review of Systems Negative except as above   PHYSICAL EXAM: BP (!) 149/103 (BP Location: Left Arm, Patient Position: Sitting, Cuff Size: Large)   Pulse 81   Temp 98.1 F (36.7 C) (Oral)   Resp 16   Wt 290 lb 3.2 oz (131.6 kg)   SpO2 96%   BMI 45.45 kg/m   Wt Readings from Last 3 Encounters:  09/26/18 290 lb 3.2 oz (131.6 kg)  09/06/18 287 lb (130.2 kg)  07/27/18 291 lb (132 kg)    Physical Exam General appearance - alert, well appearing, obese middle-aged African-American male and in no distress Mental status - normal mood, behavior, speech, dress, motor activity, and thought processes Mouth - mucous membranes moist, pharynx normal without lesions Neck - supple, no significant adenopathy Chest - clear to auscultation, no wheezes, rales or rhonchi, symmetric air entry Heart - normal rate, regular rhythm, normal S1, S2, no murmurs, rubs, clicks or gallops Extremities -trace lower extremity edema  ASSESSMENT AND PLAN: 1. Essential hypertension Not at goal.  Patient has not taken medications as yet for today.  Recommend checking blood pressure at least twice a week.  Goal is 130/80 or lower - amLODipine  (NORVASC) 10 MG tablet; Take 1 tablet (10 mg total) by mouth daily.  Dispense: 30 tablet; Refill: 3  2. CKD (chronic kidney disease) stage 5, GFR less than 15 ml/min (HCC) Followed by nephrology. Stressed the importance of good blood pressure control. Okay to continue aspirin but avoid NSAIDs  3. Chronic combined systolic and diastolic CHF (congestive heart failure) ([place]) 4. CAD in native artery Continue aspirin, beta-blocker, Lipitor and his diuretic. - torsemide (DEMADEX) 20 MG tablet; Take 2 tablets (40 mg total) by mouth daily.  Dispense: 90 tablet; Refill: 2 - atorvastatin (LIPITOR) 40 MG tablet; Take 1 tablet (40 mg total) by mouth at bedtime.  Dispense: 30 tablet; Refill: 6  5. Hyperlipidemia, unspecified hyperlipidemia type - atorvastatin (LIPITOR) 40 MG tablet; Take 1 tablet (40 mg total) by mouth at bedtime.  Dispense: 30 tablet; Refill: 6  6. Colon cancer screening - Ambulatory referral to Gastroenterology  Patient was given the opportunity to ask questions.  Patient verbalized understanding of the plan and was able to repeat key elements of the plan.   Orders Placed This Encounter  Procedures  . Ambulatory referral to Gastroenterology     Requested Prescriptions   Signed Prescriptions Disp Refills  . amLODipine (NORVASC) 10 MG tablet 30 tablet 3    Sig: Take 1 tablet (10 mg total) by mouth daily.  Marland Kitchen atorvastatin (LIPITOR) 40 MG tablet 30 tablet 6    Sig: Take 1 tablet (40 mg total) by mouth at bedtime.  . torsemide (DEMADEX) 20 MG tablet 90 tablet 2    Sig: Take 2 tablets (40 mg total) by mouth daily.    Return in about 3 months (around 12/27/2018).  Karle Plumber, MD, FACP

## 2018-10-04 MED FILL — ATORVASTATIN CALCIUM 40 MG: 40 | 30 days supply | Qty: 30 | Fill #0

## 2018-10-04 MED FILL — AMLODIPINE BESYLATE 10 MG T: 10 | 30 days supply | Qty: 30 | Fill #0

## 2018-10-04 MED FILL — TORSEMIDE 20 MG TABLET: 20 | 30 days supply | Qty: 60 | Fill #0

## 2018-11-13 MED FILL — ATORVASTATIN CALCIUM 40 MG: 40 | 30 days supply | Qty: 30 | Fill #1

## 2018-11-14 ENCOUNTER — Telehealth: Payer: Self-pay | Admitting: Vascular Surgery

## 2018-11-14 NOTE — Telephone Encounter (Signed)
-----   Message from Mena Goes, RN sent at 11/14/2018  9:34 AM EST ----- Regarding: Schedule pt to come back to discuss surgery; PA or CSD   ----- Message ----- From: Angelia Mould, MD Sent: 11/14/2018   7:36 AM EST To: [facility] Charge Pool Subject: FW: AVF                                          ----- Message ----- From: Rexene Agent, MD Sent: 11/13/2018   1:53 PM EST To: Angelia Mould, MD Subject: AVF                                            Thanks for seeing this pt recenlty. Saw him again for OV with me after you offered AVF and he declined. 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 I asnwered as best I can.  Maybe he can be s cheduled to return to see you or PA for further discussion so that he might agree for AVF Thanks Thurmond Butts

## 2018-11-14 NOTE — Telephone Encounter (Signed)
sch appt spk to pt (pt choose date) 01/03/2019 4pm f/u PA to discuss AVF

## 2018-11-23 MED FILL — CALCITRIOL 0.5 MCG CAPS: 0.5 | 30 days supply | Qty: 30 | Fill #1

## 2018-11-23 MED FILL — AMLODIPINE BESYLATE 10 MG T: 10 | 30 days supply | Qty: 30 | Fill #1

## 2018-11-26 ENCOUNTER — Encounter: Payer: Self-pay | Admitting: Gastroenterology

## 2018-12-08 MED FILL — TORSEMIDE 20 MG TABLET: 20 | 30 days supply | Qty: 60 | Fill #1

## 2018-12-21 ENCOUNTER — Other Ambulatory Visit: Payer: Self-pay | Admitting: Physician Assistant

## 2018-12-21 DIAGNOSIS — M545 Low back pain: Principal | ICD-10-CM

## 2018-12-21 DIAGNOSIS — G8929 Other chronic pain: Secondary | ICD-10-CM

## 2018-12-21 MED FILL — CARVEDILOL 25 MG TABLET: 25 | 30 days supply | Qty: 60 | Fill #1

## 2018-12-21 MED FILL — CYCLOBENZAPRINE 10 MG TAB: 10 | 30 days supply | Qty: 30 | Fill #0

## 2018-12-21 MED FILL — ATORVASTATIN CALCIUM 40 MG: 40 | 30 days supply | Qty: 30 | Fill #2

## 2018-12-31 ENCOUNTER — Encounter (HOSPITAL_COMMUNITY): Payer: Self-pay

## 2018-12-31 ENCOUNTER — Emergency Department (HOSPITAL_COMMUNITY): Payer: BLUE CROSS/BLUE SHIELD

## 2018-12-31 ENCOUNTER — Emergency Department (HOSPITAL_COMMUNITY)
Admission: EM | Admit: 2018-12-31 | Discharge: 2018-12-31 | Disposition: A | Discharge status: Home / Self Care | Payer: BLUE CROSS/BLUE SHIELD | Attending: Emergency Medicine | Admitting: Emergency Medicine

## 2018-12-31 DIAGNOSIS — I5042 Chronic combined systolic (congestive) and diastolic (congestive) heart failure: Secondary | ICD-10-CM | POA: Diagnosis not present

## 2018-12-31 DIAGNOSIS — N185 Chronic kidney disease, stage 5: Secondary | ICD-10-CM | POA: Insufficient documentation

## 2018-12-31 DIAGNOSIS — J449 Chronic obstructive pulmonary disease, unspecified: Secondary | ICD-10-CM | POA: Diagnosis not present

## 2018-12-31 DIAGNOSIS — Z79899 Other long term (current) drug therapy: Secondary | ICD-10-CM | POA: Insufficient documentation

## 2018-12-31 DIAGNOSIS — Z7982 Long term (current) use of aspirin: Secondary | ICD-10-CM | POA: Diagnosis not present

## 2018-12-31 DIAGNOSIS — Z95810 Presence of automatic (implantable) cardiac defibrillator: Secondary | ICD-10-CM | POA: Insufficient documentation

## 2018-12-31 DIAGNOSIS — I132 Hypertensive heart and chronic kidney disease with heart failure and with stage 5 chronic kidney disease, or end stage renal disease: Secondary | ICD-10-CM | POA: Diagnosis not present

## 2018-12-31 DIAGNOSIS — I2510 Atherosclerotic heart disease of native coronary artery without angina pectoris: Secondary | ICD-10-CM | POA: Insufficient documentation

## 2018-12-31 DIAGNOSIS — H538 Other visual disturbances: Secondary | ICD-10-CM

## 2018-12-31 DIAGNOSIS — J019 Acute sinusitis, unspecified: Secondary | ICD-10-CM | POA: Diagnosis not present

## 2018-12-31 MED ORDER — FLUTICASONE PROPIONATE 50 MCG/ACT NA SUSP: 1.0000 | Freq: Every day | NASAL | 2 refills | Status: DC

## 2018-12-31 MED ORDER — TETRACAINE HCL 0.5 % OP SOLN
1.0000 [drp] | Freq: Once | OPHTHALMIC | Status: AC
  Administered 2018-12-31: 1 [drp] via OPHTHALMIC
  Filled 2018-12-31: qty 4

## 2018-12-31 MED ORDER — DOXYCYCLINE HYCLATE 100 MG PO CAPS: 100.0000 mg | ORAL_CAPSULE | Freq: Two times a day (BID) | ORAL | 0 refills | Status: AC

## 2018-12-31 NOTE — ED Provider Notes (Signed)
[facility] EMERGENCY DEPARTMENT Provider Note   CSN: [id number] Arrival date & time: 12/31/18  7001     History   Chief Complaint Chief Complaint  Patient presents with  . Eye Problem    HPI Wlliam Grosso is a 58 y.o. male with PMH/o CHF, HTN resents for evaluation of 2 complaints.  Patient reports that he started having some bilateral blurry vision yesterday.  He describes it as "haziness, chalky" feeling.  He states that everything is blurry in both eyes.  He states he still able to see but he just sees haziness over it.  Patient states he did not have any vision loss.  He denies any curtain dropping sensation.  Does report that over the last week, he has had issues with his sinuses.  He reports bimaxillary and frontal sinus pressure as well as nasal congestion, rhinorrhea.  He states he is also had some watery discharge of his eyes.  Patient denies any trauma, injury to the eye.  Patient reports he does not have any fever.  Patient states that he does not wear contacts.  Patient denies any numbness/weakness of his arms or legs.  The history is provided by the patient.    Past Medical History:  Diagnosis Date  . AICD (automatic cardioverter/defibrillator) present   . Cardiac arrest ([place])   . CHF (congestive heart failure) ([place])   . Coronary artery disease   . Hypertension   . Obesity   . Pulmonary edema   . Shortness of breath dyspnea   . Sleep apnea    USES CPAP    Patient Active Problem List   Diagnosis Date Noted  . CKD (chronic kidney disease) stage 5, GFR less than 15 ml/min (HCC) 09/26/2018  . Chronic midline low back pain without sciatica 02/17/2018  . COPD exacerbation ([place]) 03/04/2016  . Cardiac arrest with ventricular fibrillation ([place]) 03/04/2016  . Defibrillator discharge   . Chronic combined systolic and diastolic CHF (congestive heart failure) ([place]) 01/22/2016  . Erectile dysfunction 01/22/2016  . Allergic rhinitis 01/22/2016  . GERD  (gastroesophageal reflux disease) 01/22/2016  . PROTEINURIA 02/17/2010  . Cardiomyopathy, ischemic 02/16/2010  . Obstructive sleep apnea 02/16/2010  . EXTERNAL HEMORRHOIDS 09/26/2009  . Tobacco use disorder in remission 06/03/2009  . Essential hypertension 10/01/2008  . Hyperlipidemia 07/14/2007  . OBESITY 07/14/2007  . Asthma 07/14/2007    Past Surgical History:  Procedure Laterality Date  . CORONARY STENT PLACEMENT    . IMPLANTABLE CARDIOVERTER DEFIBRILLATOR IMPLANT          Home Medications    Prior to Admission medications   Medication Sig Start Date End Date Taking? Authorizing Provider  albuterol (PROVENTIL HFA;VENTOLIN HFA) 108 (90 Base) MCG/ACT inhaler Inhale 2 puffs into the lungs every 4 (four) hours as needed for wheezing or shortness of breath. 02/06/16   Charlott Rakes, MD  amLODipine (NORVASC) 10 MG tablet Take 1 tablet (10 mg total) by mouth daily. 09/26/18   [name], MD  aspirin EC 81 MG tablet Take 1 tablet (81 mg total) by mouth daily. 01/22/16   [name], [name], MD  atorvastatin (LIPITOR) 40 MG tablet Take 1 tablet (40 mg total) by mouth at bedtime. 09/26/18   [name], MD  calcitRIOL (ROCALTROL) 0.25 MCG capsule Take 0.25 mcg by mouth daily.  08/16/18   [provider]  carvedilol (COREG) 25 MG tablet Take 1 tablet (25 mg total) by mouth 2 (two) times daily with a meal. 08/23/18  Jola Schmidt, MD  cyclobenzaprine (FLEXERIL) 10 MG tablet TAKE 1 TABLET (10 MG TOTAL) BY MOUTH AT BEDTIME. AS NEEDED FOR PAIN 12/21/18   [name], MD  doxycycline (VIBRAMYCIN) 100 MG capsule Take 1 capsule (100 mg total) by mouth 2 (two) times daily for 7 days. 12/31/18 01/07/19  Volanda Napoleon, PA-C  EPINEPHrine 0.3 mg/0.3 mL IJ SOAJ injection Inject 0.3 mLs into the muscle once. 02/06/16   [provider]  fluticasone (FLONASE) 50 MCG/ACT nasal spray Place 1 spray into both nostrils daily. 12/31/18   Volanda Napoleon, PA-C  metolazone  (ZAROXOLYN) 5 MG tablet Take 0.5 tablets (2.5 mg total) by mouth daily. Take 1 tablet (2.5 mg total) by mouth daily 30 minutes before you take your fluid pill. 04/03/18   Ward, Ozella Almond, PA-C  nitroGLYCERIN (NITROSTAT) 0.4 MG SL tablet Place 1 tablet (0.4 mg total) under the tongue every 5 (five) minutes x 3 doses as needed for chest pain. 03/06/16   Charolette Forward, MD  PRESCRIPTION MEDICATION Inhale into the lungs at bedtime. CPAP    [provider]  torsemide (DEMADEX) 20 MG tablet Take 2 tablets (40 mg total) by mouth daily. 09/26/18   [name], MD    Family History Family History  Problem Relation Age of Onset  . Hypertension Mother     Social History Social History   Tobacco Use  . Smoking status: Never Smoker  . Smokeless tobacco: Never Used  Substance Use Topics  . Alcohol use: No  . Drug use: No     Allergies   Influenza vaccines; Ketorolac; Ace inhibitors; Ace inhibitors; Lidocaine; Lidocaine hcl; Lisinopril; Lisinopril; Penicillins; and Penicillins   Review of Systems Review of Systems  Constitutional: Negative for fever.  HENT: Positive for congestion, rhinorrhea, sinus pressure and sinus pain.   Eyes: Positive for discharge and visual disturbance. Negative for pain and redness.  All other systems reviewed and are negative.    Physical Exam Updated Vital Signs BP (!) 172/101 (BP Location: Right Arm)   Pulse 92   Temp 98.7 F (37.1 C) (Oral)   Resp 18   SpO2 100%   Physical Exam Vitals signs and nursing note reviewed.  Constitutional:      Appearance: He is well-developed.  HENT:     Head: Normocephalic and atraumatic.     Nose: Congestion present.     Right Turbinates: Enlarged and swollen.     Left Turbinates: Enlarged and swollen.     Right Sinus: Maxillary sinus tenderness present.     Left Sinus: Maxillary sinus tenderness present.     Comments: Edematous and erythematous nasal turbinates bilaterally.  Eyes:     General:  Lids are normal. No scleral icterus.       Right eye: No discharge.        Left eye: No discharge.     Intraocular pressure: Right eye pressure is 46 mmHg. Left eye pressure is 54 mmHg. Measurements were taken using a handheld tonometer.    Extraocular Movements:     Right eye: Normal extraocular motion.     Left eye: Normal extraocular motion.     Conjunctiva/sclera: Conjunctivae normal.     Right eye: Right conjunctiva is not injected. No chemosis.    Left eye: Left conjunctiva is not injected. No chemosis.    Comments: PERRL bilaterally. EOMs intact without any difficulty.  No warmth, erythema, edema, tenderness noted to periorbital region bilaterally.   Pulmonary:  Effort: Pulmonary effort is normal.  Skin:    General: Skin is warm and dry.  Neurological:     Mental Status: He is alert.     Comments: Cranial nerves III-XII intact Follows commands, Moves all extremities  5/5 strength to BUE and BLE  Sensation intact throughout all major nerve distributions Normal coordination No gait abnormalities  No slurred speech. No facial droop.   Psychiatric:        Speech: Speech normal.        Behavior: Behavior normal.      ED Treatments / Results  Labs (all labs ordered are listed, but only abnormal results are displayed) Labs Reviewed - No data to display  EKG None  Radiology Ct Head Wo Contrast  Result Date: 12/31/2018 CLINICAL DATA:  Blurred vision over the last 2 days. EXAM: CT HEAD WITHOUT CONTRAST TECHNIQUE: Contiguous axial images were obtained from the base of the skull through the vertex without intravenous contrast. COMPARISON:  11/30/2008 FINDINGS: Brain: No acute finding. Mild generalized volume loss. No sign of old or acute infarction, mass lesion, hemorrhage, hydrocephalus or extra-axial collection. Vascular: There is atherosclerotic calcification of the major vessels at the base of the brain. Skull: Negative Sinuses/Orbits: Mucosal inflammatory changes of the  paranasal sinuses. Orbits negative. Other: None IMPRESSION: No acute finding. Mild generalized volume loss. Atherosclerotic calcification of the major vessels at the base of the brain. Electronically Signed   By: Nelson Chimes M.D.   On: 12/31/2018 21:07    Procedures Procedures (including critical care time)  Medications Ordered in ED Medications  tetracaine (PONTOCAINE) 0.5 % ophthalmic solution 1 drop (1 drop Both Eyes Given 12/31/18 1949)     Initial Impression / Assessment and Plan / ED Course  I have reviewed the triage vital signs and the nursing notes.  Pertinent labs & imaging results that were available during my care of the patient were reviewed by me and considered in my medical decision making (see chart for details).     58 y.o. M with PMH/o CHF, HTN who presents for evaluation of 1 week of nasal congestion, rhinorrhea, sinus pain and pressure and 2 days of blurry vision.  He reports that since yesterday, he has had "haziness" in both eyes.  He states that have any associated vision loss.  He denies any preceding trauma, injury.  Patient states that he thought it was his sinuses causing the issue.  He does report he has had some watery discharge. Patient is afebrile, non-toxic appearing, sitting comfortably on examination table. Vital signs reviewed and stable.  EOMs intact with any difficulty. PERRL bilaterally.  No evidence of conjunctival injection, chemosis on my exam, though question conjunctivitis given history of watery discharge.  History/physical exam not concerning for preseptal or orbital cellulitis.  Doubt glaucoma given history/physical exam. Plan to check CT head, IOP.  No neuro deficits on exam.  Do not suspect CVA given history/physical exam.  Patient had negative for any acute abnormalities though there is some sinus mucosal thickening.   Patient with difficulty cooperating with IOP's.  He required 2 people to hold him and open up his eyelids and even then, he  cannot keep his eye still and was very uncooperative. He continuously closes his eyes and will not open them.  1 Tono-Pen kept reading error, I suspect from inaccurate exam.  A second Tono-Pen was used which showed a LIOP: 54 and RIOP: 46.  Patient requested that I not try again to get additional readings.  Discussed patient with Dr. Posey Pronto (Ophthalmology) who will see patient in outpatient follow-up tomorrow for formal eye exam.  No other recommendations here in the emergency department  Updated patient on plan.  He is agreeable. At this time, patient exhibits no emergent life-threatening condition that require further evaluation in ED or admission. Patient had ample opportunity for questions and discussion. All patient's questions were answered with full understanding. Strict return precautions discussed. Patient expresses understanding and agreement to plan.    Portions of this note were generated with [employer]. Dictation errors may occur despite best attempts at proofreading.   Final Clinical Impressions(s) / ED Diagnoses   Final diagnoses:  Acute non-recurrent sinusitis, unspecified location  Blurred vision, bilateral    ED Discharge Orders         Ordered    fluticasone (FLONASE) 50 MCG/ACT nasal spray  Daily     12/31/18 2207    doxycycline (VIBRAMYCIN) 100 MG capsule  2 times daily     12/31/18 2207           Desma Mcgregor 12/31/18 2233    Julianne Rice, MD 01/01/19 2308

## 2018-12-31 NOTE — Discharge Instructions (Signed)
Use flonase as directed.   Take antibiotics as directed. Please take all of your antibiotics until finished.  As we discussed, you will need to follow-up with the referred ophthalmologist.  Call his office tomorrow to arrange for an appointment.  Return emergency department for any worsening symptoms, fevers, loss of vision or any other worsening or concerning symptoms.

## 2018-12-31 NOTE — ED Triage Notes (Signed)
Onset 2 days ago nasal congestion, watery eyes, blurred vision.  Onset of blurred vision was right eye only, now both eyes blurry.

## 2019-01-01 ENCOUNTER — Encounter: Payer: Self-pay | Admitting: Gastroenterology

## 2019-01-01 MED FILL — AMLODIPINE BESYLATE 10 MG T: 10 | 30 days supply | Qty: 30 | Fill #2

## 2019-01-01 MED FILL — FLUTICASONE PROP 50 MCG SPR: 50 | 30 days supply | Qty: 16 | Fill #0

## 2019-01-01 MED FILL — DOXYCYCLINE HYCLATE 100 MG: 100 | 7 days supply | Qty: 14 | Fill #0

## 2019-01-01 MED FILL — CALCITRIOL 0.5 MCG CAPS: 0.5 | 30 days supply | Qty: 30 | Fill #2

## 2019-01-03 ENCOUNTER — Other Ambulatory Visit: Payer: Self-pay

## 2019-01-03 ENCOUNTER — Encounter: Payer: Self-pay | Admitting: *Deleted

## 2019-01-03 ENCOUNTER — Ambulatory Visit (INDEPENDENT_AMBULATORY_CARE_PROVIDER_SITE_OTHER): Payer: BLUE CROSS/BLUE SHIELD | Admitting: Physician Assistant

## 2019-01-03 VITALS — BP 158/95 | HR 83 | Temp 97.2°F | Resp 20 | Ht 67.0 in | Wt 295.0 lb

## 2019-01-03 DIAGNOSIS — N185 Chronic kidney disease, stage 5: Secondary | ICD-10-CM | POA: Diagnosis not present

## 2019-01-03 NOTE — Progress Notes (Signed)
Established Dialysis Access   History of Present Illness   Victor Thompson is a 58 y.o. (09-03-1961) male who presents for re-evaluation for permanent access.  The patient is L hand dominant and has AICD on left side.  He had been offered a right arm fistula by Dr. Scot Dock in October 2019 however was not willing to consent at the time due to a busy work schedule and unwillingness to proceed with access placement.  He has since met with his nephrologist Dr. Joelyn Oms who further discussed need for access in anticipation of hemodialysis initiation.  He returns today willing to proceed with surgery after further discussion about the nature of fistula surgery.  He works as a Horticulturist, commercial and questions how this will affect his ability to perform his job.  He does not take any blood thinners.  He denies chest pain or shortness of breath.  He has a colonoscopy scheduled in mid February and would prefer to schedule fistula placement in early March.    Current Outpatient Medications  Medication Sig Dispense Refill  . albuterol (PROVENTIL HFA;VENTOLIN HFA) 108 (90 Base) MCG/ACT inhaler Inhale 2 puffs into the lungs every 4 (four) hours as needed for wheezing or shortness of breath. 54 g 3  . amLODipine (NORVASC) 10 MG tablet Take 1 tablet (10 mg total) by mouth daily. 30 tablet 3  . aspirin EC 81 MG tablet Take 1 tablet (81 mg total) by mouth daily. 30 tablet 5  . atorvastatin (LIPITOR) 40 MG tablet Take 1 tablet (40 mg total) by mouth at bedtime. 30 tablet 6  . calcitRIOL (ROCALTROL) 0.25 MCG capsule Take 0.25 mcg by mouth daily.   11  . carvedilol (COREG) 25 MG tablet Take 1 tablet (25 mg total) by mouth 2 (two) times daily with a meal. 60 tablet 5  . cyclobenzaprine (FLEXERIL) 10 MG tablet TAKE 1 TABLET (10 MG TOTAL) BY MOUTH AT BEDTIME. AS NEEDED FOR PAIN 30 tablet 0  . doxycycline (VIBRAMYCIN) 100 MG capsule Take 1 capsule (100 mg total) by mouth 2 (two) times daily for 7 days. 14 capsule 0  .  EPINEPHrine 0.3 mg/0.3 mL IJ SOAJ injection Inject 0.3 mLs into the muscle once.    . fluticasone (FLONASE) 50 MCG/ACT nasal spray Place 1 spray into both nostrils daily. 16 g 2  . metolazone (ZAROXOLYN) 5 MG tablet Take 0.5 tablets (2.5 mg total) by mouth daily. Take 1 tablet (2.5 mg total) by mouth daily 30 minutes before you take your fluid pill. 3 tablet 0  . nitroGLYCERIN (NITROSTAT) 0.4 MG SL tablet Place 1 tablet (0.4 mg total) under the tongue every 5 (five) minutes x 3 doses as needed for chest pain. 25 tablet 12  . PRESCRIPTION MEDICATION Inhale into the lungs at bedtime. CPAP    . torsemide (DEMADEX) 20 MG tablet Take 2 tablets (40 mg total) by mouth daily. 90 tablet 2   No current facility-administered medications for this visit.     On ROS today: 10 system ROS is negative unless otherwise noted in HPI   Physical Examination   Vitals:   01/03/19 1556  BP: (!) 158/95  Pulse: 83  Resp: 20  Temp: (!) 97.2 F (36.2 C)  TempSrc: Oral  SpO2: 99%  Weight: 295 lb (133.8 kg)  Height: '5\' 7"'$  (1.702 m)   Body mass index is 46.2 kg/m.  General Alert, O x 3, WD, NAD  Pulmonary Sym exp, good B air movt, CTA B  Cardiac  RRR, Nl S1, S2,  Vascular Vessel Right Left  Radial Palpable Palpable  Brachial Palpable Palpable  Ulnar Not palpable Not palpable    Musculo- skeletal M/S 5/5 throughout  , Extremities without ischemic changes    Neurologic A&O; CN grossly intact     Non-invasive Vascular Imaging    BUE Vein Mapping  (09/2018):   R arm: acceptable vein conduits include cephalic and basilic veins    Medical Decision Making   Victor Thompson is a 58 y.o. male who presents with chronic kidney disease stage V not yet requiring hemodialysis.    Based on vein mapping and examination, this patient's permanent access options include using right cephalic or basilic veins.  After all questions were answered he is willing to proceed with R arm AV fistula creation however  would like to wait and schedule this in early March  I asked patient to avoid lab draws or other venipuncture in R arm  Patient is also aware there is a chance he could require a second stage of surgery to superficialize/translocate even if cephalic vein was used as conduit    Dagoberto Ligas PA-C [facility]: [phone]

## 2019-01-05 ENCOUNTER — Other Ambulatory Visit: Payer: Self-pay | Admitting: Vascular Surgery

## 2019-01-15 MED FILL — TORSEMIDE 20 MG TABLET: 20 | 30 days supply | Qty: 60 | Fill #0

## 2019-01-22 ENCOUNTER — Encounter: Payer: Self-pay | Admitting: Gastroenterology

## 2019-01-22 ENCOUNTER — Ambulatory Visit (AMBULATORY_SURGERY_CENTER): Payer: Self-pay

## 2019-01-22 ENCOUNTER — Encounter: Payer: Self-pay | Admitting: Internal Medicine

## 2019-01-22 VITALS — Ht 67.0 in | Wt 291.8 lb

## 2019-01-22 DIAGNOSIS — Z1211 Encounter for screening for malignant neoplasm of colon: Secondary | ICD-10-CM

## 2019-01-22 MED ORDER — PEG 3350-KCL-NA BICARB-NACL 420 G PO SOLR: 4000.0000 mL | Freq: Once | ORAL | 0 refills | Status: AC

## 2019-01-22 NOTE — Progress Notes (Signed)
Received note from Dr. Joelyn Oms.  Patient was seen 01/15/2019.  Patient with diagnosis of CKD stage V, secondary hyperparathyroidism, proteinuria, essential hypertension and vitamin D deficiency.  CKD presumably HTN related.  GFR 11, creatinine 5.9 BUN 66, hemoglobin 11.3, PTH 215 and hepatitis B surface antigen negative.

## 2019-01-22 NOTE — Progress Notes (Signed)
Per pt, no allergies to soy or egg products.Pt not taking any weight loss meds or using  O2 at home.  Pt refused emmi video.  During the PV, I spent over 45 minutes with the pt going over the bowel prep and answering questions. Pt had many questions regarding the bowel prep and was concerned about drinking a gallon. He will call back, if he has further questions.

## 2019-01-29 MED FILL — PEG-3350 SOLUTION: 420 | 1 days supply | Qty: 4000 | Fill #0

## 2019-01-30 MED FILL — CARVEDILOL 25 MG TABLET: 25 | 30 days supply | Qty: 60 | Fill #2

## 2019-01-30 MED FILL — ATORVASTATIN CALCIUM 40 MG: 40 | 30 days supply | Qty: 30 | Fill #3

## 2019-02-01 ENCOUNTER — Encounter: Payer: Self-pay | Admitting: Gastroenterology

## 2019-02-01 ENCOUNTER — Ambulatory Visit (AMBULATORY_SURGERY_CENTER): Payer: BLUE CROSS/BLUE SHIELD | Admitting: Gastroenterology

## 2019-02-01 VITALS — BP 150/90 | HR 72 | Temp 97.8°F | Resp 16

## 2019-02-01 DIAGNOSIS — Z1211 Encounter for screening for malignant neoplasm of colon: Secondary | ICD-10-CM

## 2019-02-01 DIAGNOSIS — K6289 Other specified diseases of anus and rectum: Secondary | ICD-10-CM

## 2019-02-01 DIAGNOSIS — D129 Benign neoplasm of anus and anal canal: Secondary | ICD-10-CM

## 2019-02-01 DIAGNOSIS — D120 Benign neoplasm of cecum: Secondary | ICD-10-CM | POA: Diagnosis not present

## 2019-02-01 DIAGNOSIS — K635 Polyp of colon: Secondary | ICD-10-CM

## 2019-02-01 DIAGNOSIS — D122 Benign neoplasm of ascending colon: Secondary | ICD-10-CM | POA: Diagnosis not present

## 2019-02-01 DIAGNOSIS — D125 Benign neoplasm of sigmoid colon: Secondary | ICD-10-CM

## 2019-02-01 MED ORDER — SODIUM CHLORIDE 0.9 % IV SOLN: 500.0000 mL | Freq: Once | INTRAVENOUS | Status: DC

## 2019-02-01 NOTE — Progress Notes (Signed)
Report given to PACU, vss 

## 2019-02-01 NOTE — Op Note (Signed)
[facility] [name] Procedure Date: 02/01/2019 11:42 AM MRN: [id number] Endoscopist: Remo Lipps P. Havery Moros , MD Age: 58 Referring MD:  Date of Birth: 07-Oct-1961 Gender: Male Account #: [id number] Procedure:                Colonoscopy Indications:              Screening for colorectal malignant neoplasm Medicines:                Monitored Anesthesia Care Procedure:                Pre-Anesthesia Assessment:                           - Prior to the procedure, a History and Physical                            was performed, and patient medications and                            allergies were reviewed. The patient's tolerance of                            previous anesthesia was also reviewed. The risks                            and benefits of the procedure and the sedation                            options and risks were discussed with the patient.                            All questions were answered, and informed consent                            was obtained. Prior Anticoagulants: The patient has                            taken no previous anticoagulant or antiplatelet                            agents. ASA Grade Assessment: III - A patient with                            severe systemic disease. After reviewing the risks                            and benefits, the patient was deemed in                            satisfactory condition to undergo the procedure.                           After obtaining informed consent, the colonoscope  was passed under direct vision. Throughout the                            procedure, the patient's blood pressure, pulse, and                            oxygen saturations were monitored continuously. The                            Colonoscope was introduced through the anus and                            advanced to the the cecum, identified by                            appendiceal orifice  and ileocecal valve. The                            colonoscopy was performed without difficulty. The                            patient tolerated the procedure well. The quality                            of the bowel preparation was adequate. The                            ileocecal valve, appendiceal orifice, and rectum                            were photographed. Scope In: 11:49:53 AM Scope Out: 12:12:07 PM Scope Withdrawal Time: 0 hours 19 minutes 17 seconds  Total Procedure Duration: 0 hours 22 minutes 14 seconds  Findings:                 The perianal and digital rectal examinations were                            normal.                           A 6 mm polyp was found in the cecum. The polyp was                            sessile. The polyp was removed with a cold snare.                            Resection and retrieval were complete.                           Three sessile polyps were found in the ascending                            colon. The polyps were 4 to 5 mm in size. These  polyps were removed with a cold snare. Resection                            and retrieval were complete.                           A 3 mm polyp was found in the sigmoid colon. The                            polyp was sessile. The polyp was removed with a                            cold snare. Resection and retrieval were complete.                           A polypoid lesion was found at the anus. The lesion                            was pedunculated. I suspect benign hypertrophied                            anal papillae. Biopsies were taken with a cold                            forceps for histology to rule out AIN.                           Internal hemorrhoids were found during                            retroflexion. The hemorrhoids were moderate.                           The exam was otherwise without abnormality. Prep                            was adequate but  took several minutes to lavage and                            clear for adequate views. Complications:            No immediate complications. Estimated blood loss:                            Minimal. Estimated Blood Loss:     Estimated blood loss was minimal. Impression:               - One 6 mm polyp in the cecum, removed with a cold                            snare. Resected and retrieved.                           - Three 4 to 5 mm polyps in the ascending  colon,                            removed with a cold snare. Resected and retrieved.                           - One 3 mm polyp in the sigmoid colon, removed with                            a cold snare. Resected and retrieved.                           - Polypoid lesion at the anus. Biopsied.                           - Internal hemorrhoids.                           - The examination was otherwise normal. Recommendation:           - Patient has a contact number available for                            emergencies. The signs and symptoms of potential                            delayed complications were discussed with the                            patient. Return to normal activities tomorrow.                            Written discharge instructions were provided to the                            patient.                           - Resume previous diet.                           - Continue present medications.                           - Await pathology results. Remo Lipps P. Armbruster, MD 02/01/2019 12:17:44 PM This report has been signed electronically.

## 2019-02-01 NOTE — Progress Notes (Signed)
Pt's states no medical or surgical changes since previsit or office visit. 

## 2019-02-01 NOTE — Patient Instructions (Signed)
Handouts given on polyps and hemorrhoids. Good examples for first meal: Eggs Grits Toast Pancakes/waffles Lean meat    YOU HAD AN ENDOSCOPIC PROCEDURE TODAY AT [facility]:   Refer to the procedure report that was given to you for any specific questions about what was found during the examination.  If the procedure report does not answer your questions, please call your gastroenterologist to clarify.  If you requested that your care partner not be given the details of your procedure findings, then the procedure report has been included in a sealed envelope for you to review at your convenience later.  YOU SHOULD EXPECT: Some feelings of bloating in the abdomen. Passage of more gas than usual.  Walking can help get rid of the air that was put into your GI tract during the procedure and reduce the bloating. If you had a lower endoscopy (such as a colonoscopy or flexible sigmoidoscopy) you may notice spotting of blood in your stool or on the toilet paper. If you underwent a bowel prep for your procedure, you may not have a normal bowel movement for a few days.  Please Note:  You might notice some irritation and congestion in your nose or some drainage.  This is from the oxygen used during your procedure.  There is no need for concern and it should clear up in a day or so.  SYMPTOMS TO REPORT IMMEDIATELY:   Following lower endoscopy (colonoscopy or flexible sigmoidoscopy):  Excessive amounts of blood in the stool  Significant tenderness or worsening of abdominal pains  Swelling of the abdomen that is new, acute  Fever of 100F or higher   For urgent or emergent issues, a gastroenterologist can be reached at any hour by calling [phone].   DIET:  We do recommend a small meal at first, but then you may proceed to your regular diet.  Drink plenty of fluids but you should avoid alcoholic beverages for 24 hours.  ACTIVITY:  You should plan to take it easy for the rest of  today and you should NOT DRIVE or use heavy machinery until tomorrow (because of the sedation medicines used during the test).    FOLLOW UP: Our staff will call the number listed on your records the next business day following your procedure to check on you and address any questions or concerns that you may have regarding the information given to you following your procedure. If we do not reach you, we will leave a message.  However, if you are feeling well and you are not experiencing any problems, there is no need to return our call.  We will assume that you have returned to your regular daily activities without incident.  If any biopsies were taken you will be contacted by phone or by letter within the next 1-3 weeks.  Please call us at [phone] if you have not heard about the biopsies in 3 weeks.    SIGNATURES/CONFIDENTIALITY: You and/or your care partner have signed paperwork which will be entered into your electronic medical record.  These signatures attest to the fact that that the information above on your After Visit Summary has been reviewed and is understood.  Full responsibility of the confidentiality of this discharge information lies with you and/or your care-partner.

## 2019-02-01 NOTE — Progress Notes (Signed)
Called to room to assist during endoscopic procedure.  Patient ID and intended procedure confirmed with present staff. Received instructions for my participation in the procedure from the performing physician.  

## 2019-02-02 ENCOUNTER — Telehealth: Payer: Self-pay

## 2019-02-02 ENCOUNTER — Telehealth: Payer: Self-pay | Admitting: Gastroenterology

## 2019-02-02 NOTE — Telephone Encounter (Signed)
  Follow up Call-  Call back number 02/01/2019  Post procedure Call Back phone  # [phone]  Permission to leave phone message Yes  Some recent data might be hidden     No ID on voicemail

## 2019-02-02 NOTE — Telephone Encounter (Signed)
  Follow up Call-  Call back number 02/01/2019  Post procedure Call Back phone  # [phone]  Permission to leave phone message Yes  Some recent data might be hidden     No ID on voicemail No message left

## 2019-02-02 NOTE — Telephone Encounter (Signed)
Pt returned call stating that he is doing well.

## 2019-02-06 ENCOUNTER — Encounter: Payer: Self-pay | Admitting: Gastroenterology

## 2019-02-08 MED FILL — CALCITRIOL 0.5 MCG CAPS: 0.5 | 30 days supply | Qty: 30 | Fill #3

## 2019-02-08 MED FILL — AMLODIPINE BESYLATE 10 MG T: 10 | 30 days supply | Qty: 30 | Fill #3

## 2019-02-09 ENCOUNTER — Telehealth: Payer: Self-pay | Admitting: *Deleted

## 2019-02-09 ENCOUNTER — Encounter (HOSPITAL_COMMUNITY): Payer: Self-pay | Admitting: Physician Assistant

## 2019-02-09 ENCOUNTER — Other Ambulatory Visit: Payer: Self-pay | Admitting: *Deleted

## 2019-02-09 ENCOUNTER — Other Ambulatory Visit: Payer: Self-pay

## 2019-02-09 ENCOUNTER — Encounter (HOSPITAL_COMMUNITY): Payer: Self-pay | Admitting: *Deleted

## 2019-02-09 NOTE — Telephone Encounter (Signed)
Chart has been reviewed. Pt's primary cardiologist is Dr. Doylene Canard, who is in private practice. He is not with [facility]. Dr. Rayann Heman only follows his ICD. Cardiac clearance for procedure and any guidance regarding perioperative management of cardiac medications will need to come from Dr. Doylene Canard, Please contact his office for clearance.   I will route notification to requesting party and will remove from our preop pool.   Lyda Jester, PA-C 02/09/2019

## 2019-02-09 NOTE — Progress Notes (Signed)
Received call from Karoline Caldwell, [place] that there was some concern regarding this patient not being followed up last year when his AICD fired. He is currently a patient of Dr. Rayann Heman and also Dr. Doylene Canard. We will postpone his RUE AVF surgery that was on Monday 02-12-19 with Dr. Scot Dock until he is cleared.

## 2019-02-09 NOTE — Progress Notes (Signed)
Anesthesia Chart Review: SAME DAY WORKUP   Case:  [id number] Date/Time:  02/12/19 0715   Procedure:  RIGHT ARM ARTERIOVENOUS (AV) FISTULA CREATION (Right )   Anesthesia type:  Choice   Pre-op diagnosis:  STAGE 5 CHRONIC KIDNEY DISEASE   Location:  [facility] ROOM 12 /  [facility]   Surgeon:  Angelia Mould, MD      DISCUSSION: 58 yo male never smoker. Pertinent hx includes HTN, OSA on CPAP, CKD V not yet on HD, CAD s/p stent 2005, Cardiac arrest s/p AICD, HFrEF.  History of cardiac arrest s/p AICD placement. Follows with Dr. Rayann Heman. Review of last device interrogation 07/06/2018 shows 1 successful shock was delivered due to VT. Note in Epic advised pt to f/u in 3 months but he has not been seen.  Pt's primary cardiologist was previously Dr. Doylene Canard, pt states he no longer sees.   Pt had recent eval for kidney transplant at [facility]. Echo 06/28/18 in care everywhere shows EF 30%, mild-mod LV dilation, mod LVH, mild MR, dilated IVC c/w elevated RA pressure.   Discussed with Zigmund Daniel at [facility]. Pt needs cardiology input/followup prior to surgery. She said case will be postponed so they can get cardiac clearance.   VS: There were no vitals taken for this visit.  PROVIDERS: [name], MD is PCP  [name], MD is EP Cardiologist  LABS: Will need DOS labs Labs Reviewed - No data to display   IMAGES: CHEST - 2 VIEW  COMPARISON:  April 03, 2018  FINDINGS: Defibrillator lead tip overlies the manubrium anteriorly. No edema or consolidation. Heart is mildly enlarged with pulmonary vascularity normal. No adenopathy. There is an old fracture of the left posterolateral eighth rib. No pneumothorax.  IMPRESSION: Stable defibrillator lead placement. No edema or consolidation. Stable cardiac prominence.  EKG: Sinus rhythm with Premature atrial complexes. Rate 71. Left axis deviation. Non-specific intra-ventricular conduction block. Abnormal QRS-T angle, consider primary T wave abnormality. No  significant change was found  CV: TTE 06/28/2018 (care everywhere): SUMMARY The left ventricle is mild-moderately dilated. Moderate left ventricular hypertrophy  Left ventricular systolic function is moderately reduced. LV ejection fraction = 30-35%. The right ventricle is normal in size and function. The left atrium is mildly dilated. There is aortic valve sclerosis. There is mild mitral regurgitation. The aortic root is normal. Dilated IVC consistent with elevated RA pressure. There is no pericardial effusion. There is a pleural effusion present. There is no comparison study available.   Past Medical History:  Diagnosis Date  . AICD (automatic cardioverter/defibrillator) present    2015  . Asthma    no meds  . Cardiac arrest ([facility]) 2015  . CHF (congestive heart failure) ([place])   . Chronic kidney disease    ckd -stage 5  . Coronary artery disease   . Hypertension   . Myocardial infarction ([place])   . Obesity   . Pneumonia   . Pulmonary edema   . Shortness of breath dyspnea   . Sleep apnea    USES CPAP  . Wears glasses     Past Surgical History:  Procedure Laterality Date  . COLONOSCOPY    . COLONOSCOPY W/ BIOPSIES AND POLYPECTOMY    . CORONARY STENT PLACEMENT  2007  . IMPLANTABLE CARDIOVERTER DEFIBRILLATOR IMPLANT  2015    MEDICATIONS: . 0.9 %  sodium chloride infusion   . albuterol (PROVENTIL HFA;VENTOLIN HFA) 108 (90 Base) MCG/ACT inhaler  . amLODipine (NORVASC) 10 MG tablet  . aspirin EC  81 MG tablet  . atorvastatin (LIPITOR) 40 MG tablet  . calcitRIOL (ROCALTROL) 0.5 MCG capsule  . carvedilol (COREG) 25 MG tablet  . Cholecalciferol (VITAMIN D-3) 125 MCG (5000 UT) TABS  . cyclobenzaprine (FLEXERIL) 10 MG tablet  . EPINEPHrine 0.3 mg/0.3 mL IJ SOAJ injection  . fluticasone (FLONASE) 50 MCG/ACT nasal spray  . loratadine (CLARITIN) 10 MG tablet  . PRESCRIPTION MEDICATION  . torsemide (DEMADEX) 20 MG tablet    Wynonia Musty [facility] Short Stay  Center/Anesthesiology Phone [phone] 02/09/2019 12:24 PM

## 2019-02-09 NOTE — Telephone Encounter (Signed)
   [facility] Pre-operative Risk Assessment    Request for surgical clearance:  1. What type of surgery is being performed? RIGHT ARM FISTULA CREATION   2. When is this surgery scheduled? 02/12/19  3. What type of clearance is required (medical clearance vs. Pharmacy clearance to hold med vs. Both)? MEDICAL  4. Are there any medications that need to be held prior to surgery and how long?ASA DOES NOT NEED TO BE HELD PER DR. DICKSON'S OFFICE    5. Practice name and name of physician performing surgery? [facility]; DR. Harrell Gave DICKSON   6. What is your office phone number [phone]    7.   What is your office fax number [phone]  8.   Anesthesia type (None, local, MAC, general) ? MAC   Julaine Hua 02/09/2019, 1:32 PM  _________________________________________________________________   (provider comments below)

## 2019-02-09 NOTE — Progress Notes (Signed)
Pt denies SOB and chest pain. Pt stated that he is under the care of Dr. Rayann Heman, Cardiology. Pt denies having a cardiac cath. Peri-op prescription for ICD initiated; awaiting response. Pt made aware to stop taking vitamins, fish oil and herbal medications. Do not take any NSAIDs ie: Ibuprofen, Advil, Naproxen (Aleve), Motrin, BC and Goody Powder. Pt verbalized understanding of all pre-op instructions. PA, Anesthesiology, asked to review pt history.

## 2019-02-14 MED FILL — TORSEMIDE 20 MG TABLET: 20 | 30 days supply | Qty: 60 | Fill #1

## 2019-02-15 ENCOUNTER — Encounter: Payer: Self-pay | Admitting: Internal Medicine

## 2019-02-26 ENCOUNTER — Telehealth (INDEPENDENT_AMBULATORY_CARE_PROVIDER_SITE_OTHER): Payer: BLUE CROSS/BLUE SHIELD | Admitting: Internal Medicine

## 2019-02-26 ENCOUNTER — Encounter: Payer: BLUE CROSS/BLUE SHIELD | Admitting: Internal Medicine

## 2019-02-26 ENCOUNTER — Other Ambulatory Visit: Payer: Self-pay

## 2019-02-26 DIAGNOSIS — I255 Ischemic cardiomyopathy: Secondary | ICD-10-CM

## 2019-02-26 DIAGNOSIS — I4901 Ventricular fibrillation: Secondary | ICD-10-CM

## 2019-02-26 DIAGNOSIS — I5042 Chronic combined systolic (congestive) and diastolic (congestive) heart failure: Secondary | ICD-10-CM

## 2019-02-26 DIAGNOSIS — I469 Cardiac arrest, cause unspecified: Secondary | ICD-10-CM

## 2019-02-26 NOTE — Progress Notes (Signed)
Electrophysiology TeleHealth Note    Due to national recommendations of social distancing due to [place] 19, an audio telehealth visit is felt to be most appropriate for this patient at this time.  See MyChart message from today for the patient's consent to telehealth for [facility].   Date:  02/26/2019   ID:  Victor Thompson, DOB [birth year], MRN [id number]  Location: patient's home  Provider location: [address] [place]  Evaluation Performed: Follow-up visit  PCP:  [name], MD  Electrophysiologist:  Dr Rayann Heman Primary Cardiologist:  Dr Doylene Canard  Chief Complaint:  CHF  History of Present Illness:    Victor Thompson is a 58 y.o. male who presents via audio conferencing for a telehealth visit today.  Since last being seen in our clinic, the patient reports doing very well.  I have not seen him since 2019. He has not seen Dr Doylene Canard either due to conflicts over copays.   He is doing well.  Remains active.  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  Today, he denies symptoms of palpitations, chest pain, dizziness, presyncope, or syncope. Mild edema chronically.  SOB is well controlled. The patient is otherwise without complaint today.  The patient denies symptoms of fevers, chills, cough, or new SOB worrisome for COVID 19.   Past Medical History:  Diagnosis Date  . AICD (automatic cardioverter/defibrillator) present    2015  . Asthma    no meds  . Cardiac arrest ([place]) 2015  . CHF (congestive heart failure) ([place])   . Chronic kidney disease    ckd -stage 5  . Coronary artery disease   . Hypertension   . Myocardial infarction ([place])   . Obesity   . Pneumonia   . Pulmonary edema   . Shortness of breath dyspnea   . Sleep apnea    USES CPAP  . Wears glasses     Past Surgical History:  Procedure Laterality Date  . COLONOSCOPY    . COLONOSCOPY W/ BIOPSIES AND POLYPECTOMY    . CORONARY STENT PLACEMENT  2007  . IMPLANTABLE CARDIOVERTER  DEFIBRILLATOR IMPLANT  2015    Current Outpatient Medications  Medication Sig Dispense Refill  . albuterol (PROVENTIL HFA;VENTOLIN HFA) 108 (90 Base) MCG/ACT inhaler Inhale 2 puffs into the lungs every 4 (four) hours as needed for wheezing or shortness of breath. 54 g 3  . amLODipine (NORVASC) 10 MG tablet Take 1 tablet (10 mg total) by mouth daily. 30 tablet 3  . aspirin EC 81 MG tablet Take 1 tablet (81 mg total) by mouth daily. 30 tablet 5  . atorvastatin (LIPITOR) 40 MG tablet Take 1 tablet (40 mg total) by mouth at bedtime. 30 tablet 6  . calcitRIOL (ROCALTROL) 0.5 MCG capsule Take 0.5 mcg by mouth daily.   11  . carvedilol (COREG) 25 MG tablet Take 1 tablet (25 mg total) by mouth 2 (two) times daily with a meal. 60 tablet 5  . Cholecalciferol (VITAMIN D-3) 125 MCG (5000 UT) TABS Take 5,000 Units by mouth daily.     . cyclobenzaprine (FLEXERIL) 10 MG tablet TAKE 1 TABLET (10 MG TOTAL) BY MOUTH AT BEDTIME. AS NEEDED FOR PAIN (Patient taking differently: Take 10 mg by mouth at bedtime as needed for muscle spasms. ) 30 tablet 0  . EPINEPHrine 0.3 mg/0.3 mL IJ SOAJ injection Inject 0.3 mLs into the muscle once as needed for anaphylaxis.     . fluticasone (FLONASE) 50 MCG/ACT nasal  spray Place 1 spray into both nostrils daily. (Patient taking differently: Place 1 spray into both nostrils daily as needed for allergies. ) 16 g 2  . loratadine (CLARITIN) 10 MG tablet Take 10 mg by mouth daily as needed for allergies.    Marland Kitchen PRESCRIPTION MEDICATION Inhale into the lungs at bedtime. CPAP    . torsemide (DEMADEX) 20 MG tablet Take 2 tablets (40 mg total) by mouth daily. 90 tablet 2   Current Facility-Administered Medications  Medication Dose Route Frequency Provider Last Rate Last Dose  . 0.9 %  sodium chloride infusion  500 mL Intravenous Once Armbruster, Carlota Raspberry, MD        Allergies:   Ace inhibitors; Influenza vaccines; Ketorolac; Lidocaine; Lisinopril; and Penicillins   Social History:  The  patient  reports that he has never smoked. He has never used smokeless tobacco. He reports that he does not drink alcohol or use drugs.   Family History:  The patient's  family history includes Hypertension in his mother; Liver disease in his father.   ROS:  Please see the history of present illness.   All other systems are personally reviewed and negative.    Exam:    Vital Signs:  He has not checked vitals  Comfortable by phone, not depressed.   Was on a scaffold when I called him.   Labs/Other Tests and Data Reviewed:    Recent Labs: 07/06/2018: Magnesium 2.2 08/23/2018: B Natriuretic Peptide 576.8; BUN 35; Creatinine, Ser 5.03; Hemoglobin 11.7; Platelets 176; Potassium 4.0; Sodium 142   Wt Readings from Last 3 Encounters:  01/22/19 291 lb 12.8 oz (132.4 kg)  01/03/19 295 lb (133.8 kg)  09/26/18 290 lb 3.2 oz (131.6 kg)     Other studies personally reviewed: Additional studies/ records that were reviewed today include: my prior office visit, nephrology appointments,   Review of the above records today demonstrates: as above Prior radiographs: myoview 03/05/2016.  EF 28%, large scar along the inferolateral LV    ASSESSMENT & PLAN:    1.  Ischemic CM, chronic systolic dysfunction/ prior VF arrest Doing well clinically CHF is controlled No ischemic symptoms No changes todday  His S-ICD has not been interrogated since 1/19.  I will arrange followup with me after COVID 19.  2. preop Recently scheduled for AV fistula.  Cancelled due to COVID 19.  Ok to proceed when able without further CV testing.  Would like to have his S-ICD interrogated prior to the procedure (in preop holding).  3. COVID 19 screen The patient denies symptoms of COVID 19 at this time.  The importance of social distancing was discussed today.  Follow-up:  With me in 4 months Will refer to general cardiology as per his request, though not an urgent issue.  He does not plan to follow-up with Dr Doylene Canard   Current medicines are reviewed at length with the patient today.   The patient does not have concerns regarding his medicines.  The following changes were made today:  none  Labs/ tests ordered today include:  No orders of the defined types were placed in this encounter.  Patient Risk:  after full review of this patients clinical status, I feel that they are at high risk at this time.  Today, I have spent 12 minutes with the patient with telehealth technology discussing preop clearance and CHF.    Army Fossa, MD  02/26/2019 11:29 AM     [address]  [place] [address] [phone] (office) [phone] (fax)

## 2019-03-05 ENCOUNTER — Ambulatory Visit (HOSPITAL_COMMUNITY)
Admission: RE | Admit: 2019-03-05 | Payer: BLUE CROSS/BLUE SHIELD | Source: Home / Self Care | Admitting: Vascular Surgery

## 2019-03-05 HISTORY — DX: Pneumonia, unspecified organism: J18.9

## 2019-03-05 HISTORY — DX: Presence of spectacles and contact lenses: Z97.3

## 2019-03-05 SURGERY — ARTERIOVENOUS (AV) FISTULA CREATION
Anesthesia: Choice | Laterality: Right

## 2019-03-07 MED FILL — ATORVASTATIN CALCIUM 40 MG: 40 | 30 days supply | Qty: 30 | Fill #4

## 2019-03-07 MED FILL — CARVEDILOL 25 MG TABLET: 25 | 30 days supply | Qty: 60 | Fill #3

## 2019-03-08 ENCOUNTER — Telehealth: Payer: Self-pay | Admitting: *Deleted

## 2019-03-08 ENCOUNTER — Other Ambulatory Visit: Payer: Self-pay | Admitting: *Deleted

## 2019-03-08 NOTE — Telephone Encounter (Signed)
Call to patient and given instructions for surgery. Instructed to be at [facility] admitting at 8 am on 03/15/2019. Expect a call and follow the detailed surgery instructions received from the hospital pre-admission department. Must have a driver and caregiver for discharge. Visitor restrictions discussed. Verbalized understanding.

## 2019-03-14 ENCOUNTER — Encounter (HOSPITAL_COMMUNITY): Payer: Self-pay | Admitting: *Deleted

## 2019-03-14 ENCOUNTER — Other Ambulatory Visit: Payer: Self-pay | Admitting: *Deleted

## 2019-03-14 ENCOUNTER — Other Ambulatory Visit: Payer: Self-pay

## 2019-03-14 NOTE — Progress Notes (Signed)
Spoke with Heron Sabins, [employer] Rep to make aware that cardiologist progress note stated "  Would like to have his S-ICD interrogated prior to the procedure (in preop holding)." Rep stated that if he is needed on DOS for nurse to call him at 8:30 A.M.. Rep made aware that pt surgery is scheduled for 9:00 A.M. and pt is scheduled to arrive at 6:30A.M. PA, Anesthesiology, also made aware.

## 2019-03-14 NOTE — Progress Notes (Signed)
Pt denies SOB and chest pain. Pt stated that he is under the care of Dr. Rayann Heman, Cardiology. Pt denies having a cardiac cath. Peri-op prescription for ICD initiated; awaiting response. Pt made aware to stop taking vitamins, fish oil and herbal medications. Do not take any NSAIDs ie: Ibuprofen, Advil, Naproxen (Aleve), Motrin, BC and Goody Powder. PA, Anesthesiology, asked to review pt history. See note.   Coronavirus Screening Pt denies that he and family have experienced the following symptoms:  Cough yes/no: No Fever (>100.20F)  yes/no: No Runny nose yes/no: No Sore throat yes/no: No Difficulty breathing/shortness of breath  yes/no: No  Have you or a family member traveled in the last 14 days and where? yes/no: No  Pt reminded that hospital visitation restrictions are in effect and the importance of the restrictions.  Pt verbalized understanding of all pre-op instructions.

## 2019-03-14 NOTE — Anesthesia Preprocedure Evaluation (Addendum)
Anesthesia Evaluation  Patient identified by MRN, date of birth, ID band Patient awake    Reviewed: Allergy & Precautions, H&P , NPO status , Patient's Chart, lab work & pertinent test results, reviewed documented beta blocker date and time   Airway Mallampati: II  TM Distance: >3 FB Neck ROM: Full    Dental no notable dental hx. (+) Teeth Intact, Dental Advisory Given   Pulmonary shortness of breath, asthma , sleep apnea , COPD,  COPD inhaler,    Pulmonary exam normal breath sounds clear to auscultation       Cardiovascular hypertension, Pt. on medications and Pt. on home beta blockers + CAD, + Past MI and +CHF  + Cardiac Defibrillator  Rhythm:Regular Rate:Normal     Neuro/Psych negative neurological ROS  negative psych ROS   GI/Hepatic Neg liver ROS, GERD  ,  Endo/Other  Morbid obesity  Renal/GU CRFRenal disease  negative genitourinary   Musculoskeletal   Abdominal   Peds  Hematology negative hematology ROS (+)   Anesthesia Other Findings   Reproductive/Obstetrics negative OB ROS                           Anesthesia Physical Anesthesia Plan  ASA: III  Anesthesia Plan: General   Post-op Pain Management:    Induction: Intravenous  PONV Risk Score and Plan: 2 and Ondansetron, Dexamethasone and Midazolam  Airway Management Planned: Oral ETT  Additional Equipment:   Intra-op Plan:   Post-operative Plan: Extubation in OR  Informed Consent: I have reviewed the patients History and Physical, chart, labs and discussed the procedure including the risks, benefits and alternatives for the proposed anesthesia with the patient or authorized representative who has indicated his/her understanding and acceptance.     Dental advisory given  Plan Discussed with: CRNA  Anesthesia Plan Comments: (See previous note by Karoline Caldwell, PA-C 02/09/2019. In the interim, pt was seen by cardiology,  Dr. Rayann Heman, via telehealth. Per his note "Recently scheduled for AV fistula.  Cancelled due to COVID 19.  Ok to proceed when able without further CV testing.  Would like to have his S-ICD interrogated prior to the procedure (in preop holding)." [employer] rep contacted by Allyne Gee, RN and notified of Dr. Jackalyn Lombard request for preop interrogation. Pt's nephrologist is Pearson Grippe, MD.  )      Anesthesia Quick Evaluation

## 2019-03-15 ENCOUNTER — Encounter (HOSPITAL_COMMUNITY)
Admission: RE | Disposition: A | Discharge status: Home / Self Care | Payer: Self-pay | Source: Home / Self Care | Attending: Vascular Surgery

## 2019-03-15 ENCOUNTER — Encounter (HOSPITAL_COMMUNITY): Payer: Self-pay | Admitting: *Deleted

## 2019-03-15 ENCOUNTER — Other Ambulatory Visit (HOSPITAL_COMMUNITY): Payer: Self-pay | Admitting: Cardiology

## 2019-03-15 ENCOUNTER — Other Ambulatory Visit: Payer: Self-pay

## 2019-03-15 ENCOUNTER — Telehealth: Payer: Self-pay | Admitting: Vascular Surgery

## 2019-03-15 ENCOUNTER — Ambulatory Visit (HOSPITAL_COMMUNITY)
Admission: RE | Admit: 2019-03-15 | Discharge: 2019-03-15 | Disposition: A | Discharge status: Home / Self Care | Payer: BLUE CROSS/BLUE SHIELD | Attending: Vascular Surgery | Admitting: Vascular Surgery

## 2019-03-15 ENCOUNTER — Ambulatory Visit (HOSPITAL_COMMUNITY): Payer: BLUE CROSS/BLUE SHIELD | Admitting: Physician Assistant

## 2019-03-15 DIAGNOSIS — I2510 Atherosclerotic heart disease of native coronary artery without angina pectoris: Secondary | ICD-10-CM | POA: Insufficient documentation

## 2019-03-15 DIAGNOSIS — I252 Old myocardial infarction: Secondary | ICD-10-CM | POA: Insufficient documentation

## 2019-03-15 DIAGNOSIS — Z7982 Long term (current) use of aspirin: Secondary | ICD-10-CM | POA: Diagnosis not present

## 2019-03-15 DIAGNOSIS — Z95810 Presence of automatic (implantable) cardiac defibrillator: Secondary | ICD-10-CM | POA: Insufficient documentation

## 2019-03-15 DIAGNOSIS — I509 Heart failure, unspecified: Secondary | ICD-10-CM | POA: Insufficient documentation

## 2019-03-15 DIAGNOSIS — G4730 Sleep apnea, unspecified: Secondary | ICD-10-CM | POA: Diagnosis not present

## 2019-03-15 DIAGNOSIS — Z79899 Other long term (current) drug therapy: Secondary | ICD-10-CM | POA: Insufficient documentation

## 2019-03-15 DIAGNOSIS — I132 Hypertensive heart and chronic kidney disease with heart failure and with stage 5 chronic kidney disease, or end stage renal disease: Secondary | ICD-10-CM | POA: Insufficient documentation

## 2019-03-15 DIAGNOSIS — K219 Gastro-esophageal reflux disease without esophagitis: Secondary | ICD-10-CM | POA: Insufficient documentation

## 2019-03-15 DIAGNOSIS — N185 Chronic kidney disease, stage 5: Secondary | ICD-10-CM | POA: Insufficient documentation

## 2019-03-15 DIAGNOSIS — Z6841 Body Mass Index (BMI) 40.0 and over, adult: Secondary | ICD-10-CM | POA: Insufficient documentation

## 2019-03-15 DIAGNOSIS — J449 Chronic obstructive pulmonary disease, unspecified: Secondary | ICD-10-CM | POA: Insufficient documentation

## 2019-03-15 HISTORY — PX: AV FISTULA PLACEMENT: SHX1204

## 2019-03-15 LAB — POCT I-STAT 4, (NA,K, GLUC, HGB,HCT)
Glucose, Bld: 109 mg/dL — ABNORMAL HIGH (ref 70–99)
HCT: 31 % — ABNORMAL LOW (ref 39.0–52.0)
Hemoglobin: 10.5 g/dL — ABNORMAL LOW (ref 13.0–17.0)
Potassium: 4 mmol/L (ref 3.5–5.1)
Sodium: 143 mmol/L (ref 135–145)

## 2019-03-15 SURGERY — ARTERIOVENOUS (AV) FISTULA CREATION
Anesthesia: General | Site: Arm Upper | Laterality: Right

## 2019-03-15 MED ORDER — OXYCODONE-ACETAMINOPHEN 5-325 MG PO TABS
1.0000 | ORAL_TABLET | ORAL | Status: DC | PRN
  Administered 2019-03-15: 1 via ORAL

## 2019-03-15 MED ORDER — SODIUM CHLORIDE 0.9 % IV SOLN
INTRAVENOUS | Status: DC
  Administered 2019-03-15 (×3): via INTRAVENOUS

## 2019-03-15 MED ORDER — ONDANSETRON HCL 4 MG/2ML IJ SOLN
INTRAMUSCULAR | Status: AC
  Filled 2019-03-15: qty 2

## 2019-03-15 MED ORDER — CIPROFLOXACIN IN D5W 400 MG/200ML IV SOLN
400.0000 mg | INTRAVENOUS | Status: AC
  Administered 2019-03-15: 400 mg via INTRAVENOUS
  Filled 2019-03-15: qty 200

## 2019-03-15 MED ORDER — HEPARIN SODIUM (PORCINE) 1000 UNIT/ML IJ SOLN
INTRAMUSCULAR | Status: AC
  Filled 2019-03-15: qty 1

## 2019-03-15 MED ORDER — SODIUM CHLORIDE 0.9 % IV SOLN
INTRAVENOUS | Status: DC | PRN
  Administered 2019-03-15: 500 mL

## 2019-03-15 MED ORDER — MIDAZOLAM HCL 2 MG/2ML IJ SOLN
INTRAMUSCULAR | Status: AC
  Filled 2019-03-15: qty 2

## 2019-03-15 MED ORDER — FENTANYL CITRATE (PF) 250 MCG/5ML IJ SOLN
INTRAMUSCULAR | Status: DC | PRN
  Administered 2019-03-15: 100 ug via INTRAVENOUS

## 2019-03-15 MED ORDER — ONDANSETRON HCL 4 MG/2ML IJ SOLN
INTRAMUSCULAR | Status: DC | PRN
  Administered 2019-03-15: 4 mg via INTRAVENOUS

## 2019-03-15 MED ORDER — PHENYLEPHRINE 40 MCG/ML (10ML) SYRINGE FOR IV PUSH (FOR BLOOD PRESSURE SUPPORT)
PREFILLED_SYRINGE | INTRAVENOUS | Status: DC | PRN
  Administered 2019-03-15: 120 ug via INTRAVENOUS
  Administered 2019-03-15: 80 ug via INTRAVENOUS
  Administered 2019-03-15: 120 ug via INTRAVENOUS
  Administered 2019-03-15: 80 ug via INTRAVENOUS

## 2019-03-15 MED ORDER — 0.9 % SODIUM CHLORIDE (POUR BTL) OPTIME
TOPICAL | Status: DC | PRN
  Administered 2019-03-15: 1000 mL

## 2019-03-15 MED ORDER — OXYCODONE-ACETAMINOPHEN 5-325 MG PO TABS: 1.0000 | ORAL_TABLET | Freq: Four times a day (QID) | ORAL | 0 refills | Status: DC | PRN

## 2019-03-15 MED ORDER — SUCCINYLCHOLINE CHLORIDE 200 MG/10ML IV SOSY
PREFILLED_SYRINGE | INTRAVENOUS | Status: DC | PRN
  Administered 2019-03-15: 100 mg via INTRAVENOUS

## 2019-03-15 MED ORDER — MIDAZOLAM HCL 2 MG/2ML IJ SOLN
INTRAMUSCULAR | Status: DC | PRN
  Administered 2019-03-15: 2 mg via INTRAVENOUS

## 2019-03-15 MED ORDER — PROTAMINE SULFATE 10 MG/ML IV SOLN
INTRAVENOUS | Status: DC | PRN
  Administered 2019-03-15: 50 mg via INTRAVENOUS

## 2019-03-15 MED ORDER — SODIUM CHLORIDE 0.9 % IV SOLN
INTRAVENOUS | Status: AC
  Filled 2019-03-15: qty 1.2

## 2019-03-15 MED ORDER — EPHEDRINE SULFATE-NACL 50-0.9 MG/10ML-% IV SOSY
PREFILLED_SYRINGE | INTRAVENOUS | Status: DC | PRN
  Administered 2019-03-15 (×3): 10 mg via INTRAVENOUS

## 2019-03-15 MED ORDER — LIDOCAINE HCL (PF) 1 % IJ SOLN
INTRAMUSCULAR | Status: AC
  Filled 2019-03-15: qty 30

## 2019-03-15 MED ORDER — PROPOFOL 10 MG/ML IV BOLUS
INTRAVENOUS | Status: AC
  Filled 2019-03-15: qty 20

## 2019-03-15 MED ORDER — SODIUM CHLORIDE 0.9 % IV SOLN
INTRAVENOUS | Status: DC | PRN
  Administered 2019-03-15: 50 ug/min via INTRAVENOUS

## 2019-03-15 MED ORDER — SUGAMMADEX SODIUM 200 MG/2ML IV SOLN
INTRAVENOUS | Status: DC | PRN
  Administered 2019-03-15: 500 mg via INTRAVENOUS

## 2019-03-15 MED ORDER — ROCURONIUM BROMIDE 10 MG/ML (PF) SYRINGE
PREFILLED_SYRINGE | INTRAVENOUS | Status: DC | PRN
  Administered 2019-03-15: 50 mg via INTRAVENOUS

## 2019-03-15 MED ORDER — DEXAMETHASONE SODIUM PHOSPHATE 10 MG/ML IJ SOLN
INTRAMUSCULAR | Status: DC | PRN
  Administered 2019-03-15: 10 mg via INTRAVENOUS

## 2019-03-15 MED ORDER — OXYCODONE-ACETAMINOPHEN 5-325 MG PO TABS
ORAL_TABLET | ORAL | Status: AC
  Filled 2019-03-15: qty 1

## 2019-03-15 MED ORDER — ROCURONIUM BROMIDE 50 MG/5ML IV SOSY
PREFILLED_SYRINGE | INTRAVENOUS | Status: AC
  Filled 2019-03-15: qty 5

## 2019-03-15 MED ORDER — PROPOFOL 10 MG/ML IV BOLUS
INTRAVENOUS | Status: DC | PRN
  Administered 2019-03-15: 200 mg via INTRAVENOUS

## 2019-03-15 MED ORDER — HEPARIN SODIUM (PORCINE) 1000 UNIT/ML IJ SOLN
INTRAMUSCULAR | Status: DC | PRN
  Administered 2019-03-15: 10000 [IU] via INTRAVENOUS

## 2019-03-15 MED ORDER — DEXAMETHASONE SODIUM PHOSPHATE 10 MG/ML IJ SOLN
INTRAMUSCULAR | Status: AC
  Filled 2019-03-15: qty 1

## 2019-03-15 MED ORDER — LIDOCAINE-EPINEPHRINE (PF) 1 %-1:200000 IJ SOLN
INTRAMUSCULAR | Status: AC
  Filled 2019-03-15: qty 30

## 2019-03-15 MED ORDER — HYDROMORPHONE HCL 1 MG/ML IJ SOLN: 0.2500 mg | INTRAMUSCULAR | Status: DC | PRN

## 2019-03-15 MED ORDER — FENTANYL CITRATE (PF) 250 MCG/5ML IJ SOLN
INTRAMUSCULAR | Status: AC
  Filled 2019-03-15: qty 5

## 2019-03-15 SURGICAL SUPPLY — 32 items
ADH SKN CLS APL DERMABOND .7 (GAUZE/BANDAGES/DRESSINGS) ×1
ARMBAND PINK RESTRICT EXTREMIT (MISCELLANEOUS) ×3 IMPLANT
CANISTER SUCT 3000ML PPV (MISCELLANEOUS) ×2 IMPLANT
CANNULA VESSEL 3MM 2 BLNT TIP (CANNULA) ×2 IMPLANT
CLIP VESOCCLUDE MED 6/CT (CLIP) ×2 IMPLANT
CLIP VESOCCLUDE SM WIDE 6/CT (CLIP) ×3 IMPLANT
COVER PROBE W GEL 5X96 (DRAPES) IMPLANT
COVER WAND RF STERILE (DRAPES) ×1 IMPLANT
DECANTER SPIKE VIAL GLASS SM (MISCELLANEOUS) ×1 IMPLANT
DERMABOND ADVANCED (GAUZE/BANDAGES/DRESSINGS) ×1
DERMABOND ADVANCED .7 DNX12 (GAUZE/BANDAGES/DRESSINGS) ×1 IMPLANT
ELECT REM PT RETURN 9FT ADLT (ELECTROSURGICAL) ×2
ELECTRODE REM PT RTRN 9FT ADLT (ELECTROSURGICAL) ×1 IMPLANT
GLOVE BIO SURGEON STRL SZ7.5 (GLOVE) ×2 IMPLANT
GLOVE BIOGEL PI IND STRL 8 (GLOVE) ×1 IMPLANT
GLOVE BIOGEL PI INDICATOR 8 (GLOVE) ×1
GOWN STRL REUS W/ TWL LRG LVL3 (GOWN DISPOSABLE) ×3 IMPLANT
GOWN STRL REUS W/TWL LRG LVL3 (GOWN DISPOSABLE) ×6
KIT BASIN OR (CUSTOM PROCEDURE TRAY) ×2 IMPLANT
KIT TURNOVER KIT B (KITS) ×2 IMPLANT
NS IRRIG 1000ML POUR BTL (IV SOLUTION) ×2 IMPLANT
PACK CV ACCESS (CUSTOM PROCEDURE TRAY) ×2 IMPLANT
PAD ARMBOARD 7.5X6 YLW CONV (MISCELLANEOUS) ×4 IMPLANT
SPONGE SURGIFOAM ABS GEL 100 (HEMOSTASIS) IMPLANT
SUT PROLENE 6 0 BV (SUTURE) ×2 IMPLANT
SUT VIC AB 3-0 SH 27 (SUTURE) ×4
SUT VIC AB 3-0 SH 27X BRD (SUTURE) ×1 IMPLANT
SUT VIC AB 4-0 PS2 18 (SUTURE) ×1 IMPLANT
SUT VICRYL 4-0 PS2 18IN ABS (SUTURE) ×2 IMPLANT
TOWEL GREEN STERILE (TOWEL DISPOSABLE) ×2 IMPLANT
UNDERPAD 30X30 (UNDERPADS AND DIAPERS) ×2 IMPLANT
WATER STERILE IRR 1000ML POUR (IV SOLUTION) ×2 IMPLANT

## 2019-03-15 NOTE — Transfer of Care (Signed)
Immediate Anesthesia Transfer of Care Note  Patient: Victor Thompson  Procedure(s) Performed: RIGHT ARM ARTERIOVENOUS (AV) FISTULA CREATION (Right Arm Upper)  Patient Location: PACU  Anesthesia Type:General  Level of Consciousness: awake, alert  and oriented  Airway & Oxygen Therapy: Patient Spontanous Breathing and Patient connected to face mask oxygen  Post-op Assessment: Report given to RN and Post -op Vital signs reviewed and stable  Post vital signs: Reviewed and stable  Last Vitals:  Vitals Value Taken Time  BP    Temp    Pulse 65 03/15/2019 12:10 PM  Resp 18 03/15/2019 12:10 PM  SpO2 100 % 03/15/2019 12:10 PM  Vitals shown include unvalidated device data.  Last Pain:  Vitals:   03/15/19 0706  TempSrc:   PainSc: 0-No pain      Patients Stated Pain Goal: 3 (19/01/22 2411)  Complications: No apparent anesthesia complications

## 2019-03-15 NOTE — Op Note (Signed)
    NAME: Victor Thompson    MRN: [id number] DOB: February 01, 1961    DATE OF OPERATION: 03/15/2019  PREOP DIAGNOSIS:    Stage V chronic kidney disease  POSTOP DIAGNOSIS:    Same  PROCEDURE:    Right brachiocephalic AV fistula  SURGEON: Judeth Cornfield. Scot Dock, MD, FACS  ASSIST: Leontine Locket, PA  ANESTHESIA: General  EBL: Minimal  INDICATIONS:    Victor Thompson is a 58 y.o. male with stage V chronic kidney disease.  He has not yet on dialysis.  I was asked to place new access.  FINDINGS:   4 mm upper arm cephalic vein  TECHNIQUE:   The patient was taken to the operating room and received a general anesthetic.  The right arm was prepped and draped in usual sterile fashion.  The vein was fairly far lateral therefore had to make a separate incision over the vein and dissected below the antecubital crease to allow adequate mobilization of the vein for anastomosis to the brachial artery.  Incision in the upper arm was extended across the crease to lower exposure of the artery and get full length of the vein.  The cephalic vein was dissected free with branches divided tween clips and 3-0 silk ties.  The brachial artery was dissected free beneath the fascia.  Patient was heparinized.  The brachial artery was clamped proximally and distally and a longitudinal arteriotomy was made.  The vein was sewn into side to the artery using continuous 6-0 Prolene suture.  At the completion there was an excellent thrill in the fistula and a palpable right radial pulse.  Hemostasis was obtained in the wounds.  The wounds were closed with a deep layer of 3-0 Vicryl the skin closed with 4-0 Vicryl.  Dermabond was applied.  The patient tolerated the procedure well was transferred to the recovery room in stable condition.  All needle and sponge counts were correct.  Victor Mayo, MD, FACS [facility]  DATE OF DICTATION:   03/15/2019

## 2019-03-15 NOTE — Telephone Encounter (Signed)
sch appt lvm mld ltr 04/25/2019 10am Dialysis Duplex 1115am p/o MD

## 2019-03-15 NOTE — H&P (Signed)
Established Dialysis Access   History of Present Illness   Victor Thompson is a 58 y.o. (06/30/1961) male who presents for re-evaluation for permanent access.  The patient is L hand dominant and has AICD on left side.  He had been offered a right arm fistula by Dr. Scot Dock in October 2019 however was not willing to consent at the time due to a busy work schedule and unwillingness to proceed with access placement.  He has since met with his nephrologist Dr. Joelyn Oms who further discussed need for access in anticipation of hemodialysis initiation.  He returns today willing to proceed with surgery after further discussion about the nature of fistula surgery.  He works as a Horticulturist, commercial and questions how this will affect his ability to perform his job.  He does not take any blood thinners.  He denies chest pain or shortness of breath.  He has a colonoscopy scheduled in mid February and would prefer to schedule fistula placement in early March.          Current Outpatient Medications  Medication Sig Dispense Refill  . albuterol (PROVENTIL HFA;VENTOLIN HFA) 108 (90 Base) MCG/ACT inhaler Inhale 2 puffs into the lungs every 4 (four) hours as needed for wheezing or shortness of breath. 54 g 3  . amLODipine (NORVASC) 10 MG tablet Take 1 tablet (10 mg total) by mouth daily. 30 tablet 3  . aspirin EC 81 MG tablet Take 1 tablet (81 mg total) by mouth daily. 30 tablet 5  . atorvastatin (LIPITOR) 40 MG tablet Take 1 tablet (40 mg total) by mouth at bedtime. 30 tablet 6  . calcitRIOL (ROCALTROL) 0.25 MCG capsule Take 0.25 mcg by mouth daily.   11  . carvedilol (COREG) 25 MG tablet Take 1 tablet (25 mg total) by mouth 2 (two) times daily with a meal. 60 tablet 5  . cyclobenzaprine (FLEXERIL) 10 MG tablet TAKE 1 TABLET (10 MG TOTAL) BY MOUTH AT BEDTIME. AS NEEDED FOR PAIN 30 tablet 0  . doxycycline (VIBRAMYCIN) 100 MG capsule Take 1 capsule (100 mg total) by mouth 2 (two) times daily for 7 days. 14 capsule 0   . EPINEPHrine 0.3 mg/0.3 mL IJ SOAJ injection Inject 0.3 mLs into the muscle once.    . fluticasone (FLONASE) 50 MCG/ACT nasal spray Place 1 spray into both nostrils daily. 16 g 2  . metolazone (ZAROXOLYN) 5 MG tablet Take 0.5 tablets (2.5 mg total) by mouth daily. Take 1 tablet (2.5 mg total) by mouth daily 30 minutes before you take your fluid pill. 3 tablet 0  . nitroGLYCERIN (NITROSTAT) 0.4 MG SL tablet Place 1 tablet (0.4 mg total) under the tongue every 5 (five) minutes x 3 doses as needed for chest pain. 25 tablet 12  . PRESCRIPTION MEDICATION Inhale into the lungs at bedtime. CPAP    . torsemide (DEMADEX) 20 MG tablet Take 2 tablets (40 mg total) by mouth daily. 90 tablet 2   No current facility-administered medications for this visit.     On ROS today: 10 system ROS is negative unless otherwise noted in HPI   Physical Examination      Vitals:   01/03/19 1556  BP: (!) 158/95  Pulse: 83  Resp: 20  Temp: (!) 97.2 F (36.2 C)  TempSrc: Oral  SpO2: 99%  Weight: 295 lb (133.8 kg)  Height: _0  (1.702 m)   Body mass index is 46.2 kg/m.  General Alert, O x 3, WD, NAD  Pulmonary Sym  exp, good B air movt, CTA B  Cardiac RRR, Nl S1, S2,  Vascular Vessel Right Left  Radial Palpable Palpable  Brachial Palpable Palpable  Ulnar Not palpable Not palpable    Musculo- skeletal M/S 5/5 throughout  , Extremities without ischemic changes    Neurologic A&O; CN grossly intact     Non-invasive Vascular Imaging    BUE Vein Mapping  (09/2018):   R arm: acceptable vein conduits include cephalic and basilic veins    Medical Decision Making   Victor Thompson is a 58 y.o. male who presents with chronic kidney disease stage V not yet requiring hemodialysis.    Based on vein mapping and examination, this patient's permanent access options include using right cephalic or basilic veins.  After all questions were answered he is willing to proceed with R arm AV  fistula creation however would like to wait and schedule this in early March  I asked patient to avoid lab draws or other venipuncture in R arm  Patient is also aware there is a chance he could require a second stage of surgery to superficialize/translocate even if cephalic vein was used as conduit

## 2019-03-15 NOTE — Anesthesia Postprocedure Evaluation (Signed)
Anesthesia Post Note  Patient: Victor Thompson  Procedure(s) Performed: RIGHT ARM ARTERIOVENOUS (AV) FISTULA CREATION (Right Arm Upper)     Patient location during evaluation: PACU Anesthesia Type: General Level of consciousness: awake and alert Pain management: pain level controlled Vital Signs Assessment: post-procedure vital signs reviewed and stable Respiratory status: spontaneous breathing, nonlabored ventilation and respiratory function stable Cardiovascular status: blood pressure returned to baseline and stable Postop Assessment: no apparent nausea or vomiting Anesthetic complications: no    Last Vitals:  Vitals:   03/15/19 1310 03/15/19 1315  BP: 121/77 121/80  Pulse: 67 68  Resp: (!) 21 20  Temp:    SpO2: 96% 95%    Last Pain:  Vitals:   03/15/19 1315  TempSrc:   PainSc: 0-No pain                 Taylin Mans,W. EDMOND

## 2019-03-15 NOTE — Progress Notes (Signed)
Spoke to [employer] with [employer] about patients S-ICD according to Joey the patients Device can be interrogated post-op/  Spoke with Dr. Therisa Doyne requested that the device be interrogated pre-operatively. Gave Joey Dr. Therisa Doyne contact number to discuss.  Joey stated that if Dr. Ola Spurr still wanted it to be completed pre-op then Joey can be here by 9 am

## 2019-03-15 NOTE — Telephone Encounter (Signed)
-----   Message from Gabriel Earing, [place] sent at 03/15/2019 11:49 AM EDT ----- S/p right BC AVF 4/9.  F/u with Dr. Scot Dock in 6 weeks with duplex.  Thanks

## 2019-03-15 NOTE — Discharge Instructions (Signed)
° °  [facility]  Discharge Instructions  AV Fistula or Graft Surgery for Dialysis Access  Please refer to the following instructions for your post-procedure care. Your surgeon or physician assistant will discuss any changes with you.  Activity  You may drive the day following your surgery, if you are comfortable and no longer taking prescription pain medication. Resume full activity as the soreness in your incision resolves.  Bathing/Showering  You may shower after you go home. Keep your incision dry for 48 hours. Do not soak in a bathtub, hot tub, or swim until the incision heals completely. You may not shower if you have a hemodialysis catheter.  Incision Care  Clean your incision with mild soap and water after 48 hours. Pat the area dry with a clean towel. You do not need a bandage unless otherwise instructed. Do not apply any ointments or creams to your incision. You may have skin glue on your incision. Do not peel it off. It will come off on its own in about one week. Your arm may swell a bit after surgery. To reduce swelling use pillows to elevate your arm so it is above your heart. Your doctor will tell you if you need to lightly wrap your arm with an ACE bandage.  Diet  Resume your normal diet. There are not special food restrictions following this procedure. In order to heal from your surgery, it is CRITICAL to get adequate nutrition. Your body requires vitamins, minerals, and protein. Vegetables are the best source of vitamins and minerals. Vegetables also provide the perfect balance of protein. Processed food has little nutritional value, so try to avoid this.  Medications  Resume taking all of your medications. If your incision is causing pain, you may take over-the counter pain relievers such as acetaminophen (Tylenol). If you were prescribed a stronger pain medication, please be aware these medications can cause nausea and constipation. Prevent  nausea by taking the medication with a snack or meal. Avoid constipation by drinking plenty of fluids and eating foods with high amount of fiber, such as fruits, vegetables, and grains.  Do not take Tylenol if you are taking prescription pain medications.  Follow up Your surgeon may want to see you in the office following your access surgery. If so, this will be arranged at the time of your surgery.  Please call us immediately for any of the following conditions:  Increased pain, redness, drainage (pus) from your incision site Fever of 101 degrees or higher Severe or worsening pain at your incision site Hand pain or numbness.  Reduce your risk of vascular disease:  Stop smoking. If you would like help, call [facility] at 1-800-QUIT-NOW 705-498-9305) or [place] at [place] your cholesterol Maintain a desired weight Control your diabetes Keep your blood pressure down  Dialysis  It will take several weeks to several months for your new dialysis access to be ready for use. Your surgeon will determine when it is okay to use it. Your nephrologist will continue to direct your dialysis. You can continue to use your Permcath until your new access is ready for use.   03/15/2019 Victor Thompson [id number] [birth year]  Surgeon(s): Angelia Mould, MD  Procedure(s): Creation of right brachiocephalic AV fistula  x Do not stick fistula for 12 weeks    If you have any questions, please call the office at [phone].

## 2019-03-15 NOTE — Anesthesia Procedure Notes (Signed)
Procedure Name: Intubation Date/Time: 03/15/2019 10:44 AM Performed by: Teressa Lower., CRNA Pre-anesthesia Checklist: Patient identified, Emergency Drugs available, Suction available and Patient being monitored Patient Re-evaluated:Patient Re-evaluated prior to induction Oxygen Delivery Method: Circle system utilized Preoxygenation: Pre-oxygenation with 100% oxygen Induction Type: IV induction and Rapid sequence Laryngoscope Size: 4 and Glidescope Grade View: Grade III Tube type: Oral Tube size: 7.5 mm Number of attempts: 2 Airway Equipment and Method: Video-laryngoscopy and Stylet Placement Confirmation: ETT inserted through vocal cords under direct vision,  positive ETCO2 and breath sounds checked- equal and bilateral Secured at: 23 cm Tube secured with: Tape Dental Injury: Teeth and Oropharynx as per pre-operative assessment  Difficulty Due To: Difficult Airway- due to large tongue, Difficult Airway- due to reduced neck mobility and Difficulty was unanticipated Future Recommendations: Recommend- induction with short-acting agent, and alternative techniques readily available Comments: Dl x1 with mac 4, grade 3 view, unable to pass ett, glidescope 4 utilized, grade 1 view, BBS, +etco2, mouth as preop.

## 2019-03-16 ENCOUNTER — Encounter (HOSPITAL_COMMUNITY): Payer: Self-pay | Admitting: Vascular Surgery

## 2019-03-19 ENCOUNTER — Encounter (HOSPITAL_COMMUNITY): Payer: Self-pay | Admitting: Vascular Surgery

## 2019-03-23 MED FILL — AMLODIPINE BESYLATE 10 MG T: 10 | 30 days supply | Qty: 30 | Fill #3

## 2019-03-23 MED FILL — CALCITRIOL 0.5 MCG CAPS: 0.5 | 30 days supply | Qty: 30 | Fill #4

## 2019-03-30 MED FILL — TORSEMIDE 20 MG TABLET: 20 | 30 days supply | Qty: 60 | Fill #2

## 2019-04-03 ENCOUNTER — Other Ambulatory Visit: Payer: Self-pay

## 2019-04-03 DIAGNOSIS — N185 Chronic kidney disease, stage 5: Secondary | ICD-10-CM

## 2019-04-09 ENCOUNTER — Other Ambulatory Visit: Payer: Self-pay

## 2019-04-09 ENCOUNTER — Emergency Department (HOSPITAL_COMMUNITY)
Admission: EM | Admit: 2019-04-09 | Discharge: 2019-04-10 | Disposition: A | Discharge status: Home / Self Care | Payer: BLUE CROSS/BLUE SHIELD | Attending: Emergency Medicine | Admitting: Emergency Medicine

## 2019-04-09 ENCOUNTER — Encounter (HOSPITAL_COMMUNITY): Payer: Self-pay | Admitting: Emergency Medicine

## 2019-04-09 ENCOUNTER — Emergency Department (HOSPITAL_COMMUNITY): Payer: BLUE CROSS/BLUE SHIELD

## 2019-04-09 DIAGNOSIS — Z7982 Long term (current) use of aspirin: Secondary | ICD-10-CM | POA: Insufficient documentation

## 2019-04-09 DIAGNOSIS — I252 Old myocardial infarction: Secondary | ICD-10-CM | POA: Insufficient documentation

## 2019-04-09 DIAGNOSIS — Z95810 Presence of automatic (implantable) cardiac defibrillator: Secondary | ICD-10-CM | POA: Insufficient documentation

## 2019-04-09 DIAGNOSIS — Z79899 Other long term (current) drug therapy: Secondary | ICD-10-CM | POA: Diagnosis not present

## 2019-04-09 DIAGNOSIS — J449 Chronic obstructive pulmonary disease, unspecified: Secondary | ICD-10-CM | POA: Insufficient documentation

## 2019-04-09 DIAGNOSIS — N185 Chronic kidney disease, stage 5: Secondary | ICD-10-CM | POA: Diagnosis not present

## 2019-04-09 DIAGNOSIS — I5042 Chronic combined systolic (congestive) and diastolic (congestive) heart failure: Secondary | ICD-10-CM | POA: Diagnosis not present

## 2019-04-09 DIAGNOSIS — I132 Hypertensive heart and chronic kidney disease with heart failure and with stage 5 chronic kidney disease, or end stage renal disease: Secondary | ICD-10-CM | POA: Diagnosis not present

## 2019-04-09 DIAGNOSIS — J4541 Moderate persistent asthma with (acute) exacerbation: Secondary | ICD-10-CM

## 2019-04-09 DIAGNOSIS — R0602 Shortness of breath: Secondary | ICD-10-CM | POA: Diagnosis present

## 2019-04-09 DIAGNOSIS — I2510 Atherosclerotic heart disease of native coronary artery without angina pectoris: Secondary | ICD-10-CM | POA: Diagnosis not present

## 2019-04-09 LAB — CBC WITH DIFFERENTIAL/PLATELET
Abs Immature Granulocytes: 0.01 10*3/uL (ref 0.00–0.07)
Basophils Absolute: 0 10*3/uL (ref 0.0–0.1)
Basophils Relative: 0 %
Eosinophils Absolute: 0.6 10*3/uL — ABNORMAL HIGH (ref 0.0–0.5)
Eosinophils Relative: 11 %
HCT: 35 % — ABNORMAL LOW (ref 39.0–52.0)
Hemoglobin: 11.4 g/dL — ABNORMAL LOW (ref 13.0–17.0)
Immature Granulocytes: 0 %
Lymphocytes Relative: 15 %
Lymphs Abs: 0.9 10*3/uL (ref 0.7–4.0)
MCH: 30.5 pg (ref 26.0–34.0)
MCHC: 32.6 g/dL (ref 30.0–36.0)
MCV: 93.6 fL (ref 80.0–100.0)
Monocytes Absolute: 0.8 10*3/uL (ref 0.1–1.0)
Monocytes Relative: 14 %
Neutro Abs: 3.5 10*3/uL (ref 1.7–7.7)
Neutrophils Relative %: 60 %
Platelets: 203 10*3/uL (ref 150–400)
RBC: 3.74 MIL/uL — ABNORMAL LOW (ref 4.22–5.81)
RDW: 14.9 % (ref 11.5–15.5)
WBC: 5.9 10*3/uL (ref 4.0–10.5)
nRBC: 0 % (ref 0.0–0.2)

## 2019-04-09 LAB — BASIC METABOLIC PANEL
Anion gap: 10 (ref 5–15)
BUN: 55 mg/dL — ABNORMAL HIGH (ref 6–20)
CO2: 22 mmol/L (ref 22–32)
Calcium: 8.2 mg/dL — ABNORMAL LOW (ref 8.9–10.3)
Chloride: 110 mmol/L (ref 98–111)
Creatinine, Ser: 6.5 mg/dL — ABNORMAL HIGH (ref 0.61–1.24)
GFR calc Af Amer: 10 mL/min — ABNORMAL LOW (ref >60)
GFR calc non Af Amer: 9 mL/min — ABNORMAL LOW (ref >60)
Glucose, Bld: 106 mg/dL — ABNORMAL HIGH (ref 70–99)
Potassium: 4.4 mmol/L (ref 3.5–5.1)
Sodium: 142 mmol/L (ref 135–145)

## 2019-04-09 MED ORDER — MAGNESIUM SULFATE 2 GM/50ML IV SOLN
2.0000 g | Freq: Once | INTRAVENOUS | Status: AC
  Administered 2019-04-09: 2 g via INTRAVENOUS
  Filled 2019-04-09: qty 50

## 2019-04-09 MED ORDER — ALBUTEROL SULFATE HFA 108 (90 BASE) MCG/ACT IN AERS
8.0000 | INHALATION_SPRAY | Freq: Once | RESPIRATORY_TRACT | Status: AC
  Administered 2019-04-09: 8 via RESPIRATORY_TRACT
  Filled 2019-04-09: qty 6.7

## 2019-04-09 MED ORDER — METHYLPREDNISOLONE SODIUM SUCC 125 MG IJ SOLR
125.0000 mg | Freq: Once | INTRAMUSCULAR | Status: AC
  Administered 2019-04-09: 125 mg via INTRAVENOUS
  Filled 2019-04-09 (×2): qty 2

## 2019-04-09 MED ORDER — AEROCHAMBER PLUS FLO-VU MISC
1.0000 | Freq: Once | Status: AC
  Administered 2019-04-09: 1
  Filled 2019-04-09: qty 1

## 2019-04-09 MED ORDER — IPRATROPIUM-ALBUTEROL 20-100 MCG/ACT IN AERS
2.0000 | INHALATION_SPRAY | Freq: Once | RESPIRATORY_TRACT | Status: AC
  Administered 2019-04-09: 2 via RESPIRATORY_TRACT
  Filled 2019-04-09: qty 4

## 2019-04-09 MED ORDER — PREDNISONE 20 MG PO TABS: ORAL_TABLET | ORAL | 0 refills | Status: DC

## 2019-04-09 NOTE — ED Triage Notes (Signed)
Pt in with c/o sob worsened x few days. Hx of asthma, also has R arm fistula - waiting for it to mature for dialysis. States he feels like he has excess fluid today. Denies any cp, n/v, cough or fevers.

## 2019-04-09 NOTE — ED Notes (Signed)
Some noted improvement with breathing tx.  Pt does have increased cough prior and after tx  since arriving to the ED.  Requires frequent reminders to wear mask.  No scute changes noted.

## 2019-04-09 NOTE — ED Provider Notes (Signed)
[facility] EMERGENCY DEPARTMENT Provider Note   CSN: [id number] Arrival date & time: 04/09/19  0849    History   Chief Complaint Chief Complaint  Patient presents with   Shortness of Breath    HPI Victor Thompson is a 58 y.o. male.     58 yo M with a chief complaint shortness of breath.  Really got worse yesterday patient started having worsening cough.  Has a history of asthma and thinks it feels the same.  Has tried his albuterol inhaler with some minimal improvement.  Patient has a history of CKD and has recently had a AV fistula created.  Thinks he may be mildly more edematous than normal.  No fevers, chills, no coronavirus exposure.   The history is provided by the patient.  Shortness of Breath  Severity:  Moderate Onset quality:  Gradual Duration:  2 days Timing:  Constant Progression:  Worsening Chronicity:  Recurrent Relieved by:  Inhaler Worsened by:  Nothing Ineffective treatments:  None tried Associated symptoms: cough   Associated symptoms: no abdominal pain, no chest pain, no fever, no headaches, no rash and no vomiting     Past Medical History:  Diagnosis Date   AICD (automatic cardioverter/defibrillator) present    2015   Asthma    no meds   Cardiac arrest ([place]) 2015   CHF (congestive heart failure) (HCC)    Chronic kidney disease    ckd -stage 5   Coronary artery disease    Hypertension    Myocardial infarction ([place])    Obesity    Pneumonia    Pulmonary edema    Shortness of breath dyspnea    Sleep apnea    USES CPAP   Wears glasses     Patient Active Problem List   Diagnosis Date Noted   CKD (chronic kidney disease) stage 5, GFR less than 15 ml/min ([place]) 09/26/2018   Chronic midline low back pain without sciatica 02/17/2018   COPD exacerbation ([place]) 03/04/2016   Cardiac arrest with ventricular fibrillation ([place]) 03/04/2016   Defibrillator discharge    Chronic combined systolic and diastolic CHF  (congestive heart failure) ([place]) 01/22/2016   Erectile dysfunction 01/22/2016   Allergic rhinitis 01/22/2016   GERD (gastroesophageal reflux disease) 01/22/2016   PROTEINURIA 02/17/2010   Cardiomyopathy, ischemic 02/16/2010   Obstructive sleep apnea 02/16/2010   EXTERNAL HEMORRHOIDS 09/26/2009   Tobacco use disorder in remission 06/03/2009   Essential hypertension 10/01/2008   Hyperlipidemia 07/14/2007   OBESITY 07/14/2007   Asthma 07/14/2007    Past Surgical History:  Procedure Laterality Date   AV FISTULA PLACEMENT Right 03/15/2019   Procedure: RIGHT ARM ARTERIOVENOUS (AV) FISTULA CREATION;  Surgeon: Angelia Mould, MD;  Location: [place];  Service: Vascular;  Laterality: Right;   COLONOSCOPY     COLONOSCOPY W/ BIOPSIES AND POLYPECTOMY     CORONARY STENT PLACEMENT  2007   IMPLANTABLE CARDIOVERTER DEFIBRILLATOR IMPLANT  2015        Home Medications    Prior to Admission medications   Medication Sig Start Date End Date Taking? Authorizing Provider  albuterol (PROVENTIL HFA;VENTOLIN HFA) 108 (90 Base) MCG/ACT inhaler Inhale 2 puffs into the lungs every 4 (four) hours as needed for wheezing or shortness of breath. 02/06/16   Charlott Rakes, MD  amLODipine (NORVASC) 10 MG tablet Take 1 tablet (10 mg total) by mouth daily. 09/26/18   [name], MD  aspirin EC 81 MG tablet Take 1 tablet (81 mg total) by mouth  daily. 01/22/16   Boykin Nearing, MD  atorvastatin (LIPITOR) 40 MG tablet Take 1 tablet (40 mg total) by mouth at bedtime. 09/26/18   [name], MD  calcitRIOL (ROCALTROL) 0.5 MCG capsule Take 0.5 mcg by mouth daily.  08/16/18   [provider]  carvedilol (COREG) 25 MG tablet Take 1 tablet (25 mg total) by mouth 2 (two) times daily with a meal. 08/23/18   Jola Schmidt, MD  Cholecalciferol (VITAMIN D-3) 125 MCG (5000 UT) TABS Take 5,000 Units by mouth daily.     [provider]  cyclobenzaprine (FLEXERIL) 10 MG tablet TAKE  1 TABLET (10 MG TOTAL) BY MOUTH AT BEDTIME. AS NEEDED FOR PAIN Patient taking differently: Take 10 mg by mouth at bedtime as needed for muscle spasms.  12/21/18   [name], MD  EPINEPHrine 0.3 mg/0.3 mL IJ SOAJ injection Inject 0.3 mLs into the muscle once as needed for anaphylaxis.  02/06/16   [provider]  fluticasone (FLONASE) 50 MCG/ACT nasal spray Place 1 spray into both nostrils daily. Patient taking differently: Place 1 spray into both nostrils daily as needed for allergies.  12/31/18   Volanda Napoleon, PA-C  loratadine (CLARITIN) 10 MG tablet Take 10 mg by mouth daily as needed for allergies.    [provider]  oxyCODONE-acetaminophen (PERCOCET) 5-325 MG tablet Take 1 tablet by mouth every 6 (six) hours as needed for severe pain. 03/15/19   Rhyne, Hulen Shouts, PA-C  predniSONE (DELTASONE) 20 MG tablet 2 tabs po daily x 4 days 04/09/19   [name], DO  PRESCRIPTION MEDICATION Inhale into the lungs at bedtime. CPAP    [provider]  torsemide (DEMADEX) 20 MG tablet Take 2 tablets (40 mg total) by mouth daily. 09/26/18   [name], MD    Family History Family History  Problem Relation Age of Onset   Hypertension Mother    Liver disease Father     Social History Social History   Tobacco Use   Smoking status: Never Smoker   Smokeless tobacco: Never Used  Substance Use Topics   Alcohol use: No   Drug use: No     Allergies   Ace inhibitors; Influenza vaccines; Ketorolac; Lidocaine; Lisinopril; and Penicillins   Review of Systems Review of Systems  Constitutional: Negative for chills and fever.  HENT: Negative for congestion and facial swelling.   Eyes: Negative for discharge and visual disturbance.  Respiratory: Positive for cough and shortness of breath.   Cardiovascular: Negative for chest pain and palpitations.  Gastrointestinal: Negative for abdominal pain, diarrhea and vomiting.  Musculoskeletal: Negative for  arthralgias and myalgias.  Skin: Negative for color change and rash.  Neurological: Negative for tremors, syncope and headaches.  Psychiatric/Behavioral: Negative for confusion and dysphoric mood.     Physical Exam Updated Vital Signs BP 127/78    Pulse 77    Temp 97.8 F (36.6 C) (Oral)    Resp 16    Ht 5\' 7"  (1.702 m)    Wt 134.3 kg    SpO2 97%    BMI 46.36 kg/m   Physical Exam Vitals signs and nursing note reviewed.  Constitutional:      Appearance: He is well-developed.  HENT:     Head: Normocephalic and atraumatic.  Eyes:     Pupils: Pupils are equal, round, and reactive to light.  Neck:     Musculoskeletal: Normal range of motion and neck supple.     Vascular: No JVD.  Cardiovascular:     Rate and Rhythm: Normal rate and regular rhythm.     Heart sounds: No murmur. No friction rub. No gallop.   Pulmonary:     Effort: No respiratory distress.     Breath sounds: Wheezing (diffuse) present.  Abdominal:     General: There is no distension.     Tenderness: There is no guarding or rebound.  Musculoskeletal: Normal range of motion.  Skin:    Coloration: Skin is not pale.     Findings: No rash.  Neurological:     Mental Status: He is alert and oriented to person, place, and time.  Psychiatric:        Behavior: Behavior normal.      ED Treatments / Results  Labs (all labs ordered are listed, but only abnormal results are displayed) Labs Reviewed  CBC WITH DIFFERENTIAL/PLATELET - Abnormal; Notable for the following components:      Result Value   RBC 3.74 (*)    Hemoglobin 11.4 (*)    HCT 35.0 (*)    Eosinophils Absolute 0.6 (*)    All other components within normal limits  BASIC METABOLIC PANEL - Abnormal; Notable for the following components:   Glucose, Bld 106 (*)    BUN 55 (*)    Creatinine, Ser 6.50 (*)    Calcium 8.2 (*)    GFR calc non Af Amer 9 (*)    GFR calc Af Amer 10 (*)    All other components within normal limits    EKG EKG  Interpretation  Date/Time:  Monday Apr 09 2019 09:05:05 EDT Ventricular Rate:  82 PR Interval:  182 QRS Duration: 136 QT Interval:  428 QTC Calculation: 500 R Axis:   -61 Text Interpretation:  Sinus rhythm with Fusion complexes Possible Left atrial enlargement Left axis deviation Left bundle branch block Abnormal ECG No significant change since last tracing Confirmed by [name] [phone]) on 04/09/2019 11:17:24 AM   Radiology Dg Chest 2 View  Result Date: 04/09/2019 CLINICAL DATA:  Shortness of breath 5 days with cough beginning 2 days ago. EXAM: CHEST - 2 VIEW COMPARISON:  08/23/2018 FINDINGS: Left-sided defibrillator device unchanged with tip projecting over the upper anterior left chest wall just left of midline. Lungs are adequately inflated with minimal prominence of the central perihilar vessels likely minimal vascular congestion. No evidence of lobar consolidation or effusion. Mild stable cardiomegaly. Degenerative changes of the spine with bridging syndesmophytes. IMPRESSION: Mild cardiomegaly with suggestion of minimal vascular congestion. Electronically Signed   By: Marin Olp M.D.   On: 04/09/2019 09:39    Procedures Procedures (including critical care time)  Medications Ordered in ED Medications  magnesium sulfate IVPB 2 g 50 mL (2 g Intravenous New Bag/Given 04/09/19 1317)  methylPREDNISolone sodium succinate (SOLU-MEDROL) 125 mg/2 mL injection 125 mg (125 mg Intravenous Given 04/09/19 1242)  albuterol (VENTOLIN HFA) 108 (90 Base) MCG/ACT inhaler 8 puff (8 puffs Inhalation Given 04/09/19 1241)  Ipratropium-Albuterol (COMBIVENT) respimat 2 puff (2 puffs Inhalation Given 04/09/19 1256)  aerochamber plus with mask device 1 each (1 each Other Given 04/09/19 1257)     Initial Impression / Assessment and Plan / ED Course  I have reviewed the triage vital signs and the nursing notes.  Pertinent labs & imaging results that were available during my care of the patient were reviewed by me  and considered in my medical decision making (see chart for details).        58 yo M  with a cc of sob.  Wheezing on exam.  Improved with inhalers, steroids, magnesium.  Feeling much better.  CXR without focal infiltrate or pneumothorax.    2:57 PM:  I have discussed the diagnosis/risks/treatment options with the patient and family and believe the pt to be eligible for discharge home to follow-up with PCP. We also discussed returning to the ED immediately if new or worsening sx occur. We discussed the sx which are most concerning (e.g., sudden worsening pain, fever, inability to tolerate by mouth) that necessitate immediate return. Medications administered to the patient during their visit and any new prescriptions provided to the patient are listed below.  Medications given during this visit Medications  magnesium sulfate IVPB 2 g 50 mL (2 g Intravenous New Bag/Given 04/09/19 1317)  methylPREDNISolone sodium succinate (SOLU-MEDROL) 125 mg/2 mL injection 125 mg (125 mg Intravenous Given 04/09/19 1242)  albuterol (VENTOLIN HFA) 108 (90 Base) MCG/ACT inhaler 8 puff (8 puffs Inhalation Given 04/09/19 1241)  Ipratropium-Albuterol (COMBIVENT) respimat 2 puff (2 puffs Inhalation Given 04/09/19 1256)  aerochamber plus with mask device 1 each (1 each Other Given 04/09/19 1257)     The patient appears reasonably screen and/or stabilized for discharge and I doubt any other medical condition or other [facility] requiring further screening, evaluation, or treatment in the ED at this time prior to discharge.    Final Clinical Impressions(s) / ED Diagnoses   Final diagnoses:  Moderate persistent asthma with exacerbation    ED Discharge Orders         Ordered    predniSONE (DELTASONE) 20 MG tablet     04/09/19 [place], [place], DO 04/09/19 1457

## 2019-04-09 NOTE — Discharge Instructions (Signed)
Use your inhaler every 4 hours(6 puffs) while awake, return for sudden worsening shortness of breath, or if you need to use your inhaler more often.   Use your combivent inhaler two times a day for the next week.

## 2019-04-09 NOTE — ED Notes (Signed)
Ambulated in halls without acute resp distress, able to speak full sentences.  Ambulating SA02 on RA 98-100%

## 2019-04-10 ENCOUNTER — Telehealth: Payer: Self-pay

## 2019-04-10 ENCOUNTER — Encounter: Payer: Self-pay | Admitting: Cardiology

## 2019-04-10 ENCOUNTER — Telehealth (INDEPENDENT_AMBULATORY_CARE_PROVIDER_SITE_OTHER): Payer: BLUE CROSS/BLUE SHIELD | Admitting: Cardiology

## 2019-04-10 VITALS — Ht 67.0 in | Wt 296.0 lb

## 2019-04-10 DIAGNOSIS — I10 Essential (primary) hypertension: Secondary | ICD-10-CM

## 2019-04-10 DIAGNOSIS — Z7189 Other specified counseling: Secondary | ICD-10-CM | POA: Diagnosis not present

## 2019-04-10 DIAGNOSIS — I5042 Chronic combined systolic (congestive) and diastolic (congestive) heart failure: Secondary | ICD-10-CM

## 2019-04-10 DIAGNOSIS — I255 Ischemic cardiomyopathy: Secondary | ICD-10-CM

## 2019-04-10 DIAGNOSIS — I2510 Atherosclerotic heart disease of native coronary artery without angina pectoris: Secondary | ICD-10-CM | POA: Diagnosis not present

## 2019-04-10 DIAGNOSIS — N185 Chronic kidney disease, stage 5: Secondary | ICD-10-CM

## 2019-04-10 DIAGNOSIS — E7800 Pure hypercholesterolemia, unspecified: Secondary | ICD-10-CM

## 2019-04-10 NOTE — Progress Notes (Signed)
Virtual Visit via Video Note   This visit type was conducted due to national recommendations for restrictions regarding the COVID-19 Pandemic (e.g. social distancing) in an effort to limit this patient's exposure and mitigate transmission in our community.  Due to his co-morbid illnesses, this patient is at least at moderate risk for complications without adequate follow up.  This format is felt to be most appropriate for this patient at this time.  All issues noted in this document were discussed and addressed.  A limited physical exam was performed with this format.  Please refer to the patient's chart for his consent to telehealth for [facility].   Evaluation Performed:  Cardiology consult  This visit type was conducted due to national recommendations for restrictions regarding the COVID-19 Pandemic (e.g. social distancing).  This format is felt to be most appropriate for this patient at this time.  All issues noted in this document were discussed and addressed.  No physical exam was performed (except for noted visual exam findings with Video Visits).  Please refer to the patient's chart (MyChart message for video visits and phone note for telephone visits) for the patient's consent to telehealth for [facility].  Date:  04/10/2019   ID:  [name], DOB May 23, 1961, MRN [id number]  Patient Location:  Home  Provider location:   [place]  PCP:  [name], MD  Cardiologist:  NEW Electrophysiologist:  None   Chief Complaint:  Ischemic DCM  History of Present Illness:    Victor Thompson is a 58 y.o. male who presents via audio/video conferencing for a telehealth visit today in referral by Karle Plumber, MD to establish with general Cardiologist.  This is a pleasant 58 year old African-American male who was followed by Dr. Doylene Canard in the past and wishes to establish with a new cardiologist.  He has a history of ischemic dilated cardiomyopathy with cardiac arrest status  post ICD at [facility] in 2015 and is now followed in our device clinic. He has a history of ASCAD status post PCI in 2005.  He has chronic systolic CHF, morbid obesity, OSA on CPAP, chronic kidney disease, hypertension and hyperlipidemia.  His last echo in 2016 showed mildly reduced LV systolic function with EF 45 to 50% with moderate hypokinesis of the anterior myocardium and mild hypokinesis of the inferior myocardium.  There is grade 1 diastolic dysfunction.  The left atrium was moderately to severely dilated and there was mild MR. He also has a history of angioedema with no clear trigger.  He is here today for followup and is doing well.  He denies any anginal chest pain, PND, orthopnea, dizziness, palpitations or syncope. He has intermittent LE edema which is well controlled and usually only gets edema if he eats too much sodium.  He tells me that last Thursday he was going up and down 3 flights of stairs multiple times and noticed some DOE.  He says that he was wheezing some and used his albuterol inhaler but it was outdated.  He says that the wheezing got worse and he could not lay down on Sunday.  He has a history of asthma.  He was worried that he might have fluid on his lungs.  He went to the ER and was given a breathing treatment, steroids and antibiotics.  He was dx with asthmatic exacerbation and was sent home on steroids.  He is feeling much better today.  e is compliant with his meds and is tolerating meds with no  SE.    The patient does not have symptoms concerning for COVID-19 infection (fever, chills, cough, or new shortness of breath).   Prior CV studies:   The following studies were reviewed today:  2D echo 2016  Past Medical History:  Diagnosis Date  . AICD (automatic cardioverter/defibrillator) present    2015  . Asthma    no meds  . Cardiac arrest ([place]) 2015  . CHF (congestive heart failure) ([place])   . Chronic kidney disease    ckd -stage 5  . Coronary artery  disease   . Hypertension   . Myocardial infarction ([place])   . Obesity   . Pneumonia   . Pulmonary edema   . Shortness of breath dyspnea   . Sleep apnea    USES CPAP  . Wears glasses    Past Surgical History:  Procedure Laterality Date  . AV FISTULA PLACEMENT Right 03/15/2019   Procedure: RIGHT ARM ARTERIOVENOUS (AV) FISTULA CREATION;  Surgeon: Angelia Mould, MD;  Location: [place];  Service: Vascular;  Laterality: Right;  . COLONOSCOPY    . COLONOSCOPY W/ BIOPSIES AND POLYPECTOMY    . CORONARY STENT PLACEMENT  2007  . IMPLANTABLE CARDIOVERTER DEFIBRILLATOR IMPLANT  2015     Current Meds  Medication Sig  . albuterol (PROVENTIL HFA;VENTOLIN HFA) 108 (90 Base) MCG/ACT inhaler Inhale 2 puffs into the lungs every 4 (four) hours as needed for wheezing or shortness of breath.  Marland Kitchen amLODipine (NORVASC) 10 MG tablet Take 1 tablet (10 mg total) by mouth daily.  Marland Kitchen aspirin EC 81 MG tablet Take 1 tablet (81 mg total) by mouth daily.  Marland Kitchen atorvastatin (LIPITOR) 40 MG tablet Take 1 tablet (40 mg total) by mouth at bedtime.  . calcitRIOL (ROCALTROL) 0.5 MCG capsule Take 0.5 mcg by mouth daily.   . carvedilol (COREG) 25 MG tablet Take 1 tablet (25 mg total) by mouth 2 (two) times daily with a meal.  . Cholecalciferol (VITAMIN D-3) 125 MCG (5000 UT) TABS Take 5,000 Units by mouth daily.   . cyclobenzaprine (FLEXERIL) 10 MG tablet TAKE 1 TABLET (10 MG TOTAL) BY MOUTH AT BEDTIME. AS NEEDED FOR PAIN (Patient taking differently: Take 10 mg by mouth at bedtime as needed for muscle spasms. )  . EPINEPHrine 0.3 mg/0.3 mL IJ SOAJ injection Inject 0.3 mLs into the muscle once as needed for anaphylaxis.   . fluticasone (FLONASE) 50 MCG/ACT nasal spray Place 1 spray into both nostrils daily. (Patient taking differently: Place 1 spray into both nostrils daily as needed for allergies. )  . loratadine (CLARITIN) 10 MG tablet Take 10 mg by mouth daily as needed for allergies.  Marland Kitchen oxyCODONE-acetaminophen (PERCOCET)  5-325 MG tablet Take 1 tablet by mouth every 6 (six) hours as needed for severe pain.  . predniSONE (DELTASONE) 20 MG tablet 2 tabs po daily x 4 days  . PRESCRIPTION MEDICATION Inhale into the lungs at bedtime. CPAP  . torsemide (DEMADEX) 20 MG tablet Take 2 tablets (40 mg total) by mouth daily.     Allergies:   Ace inhibitors; Influenza vaccines; Ketorolac; Lidocaine; Lisinopril; and Penicillins   Social History   Tobacco Use  . Smoking status: Never Smoker  . Smokeless tobacco: Never Used  Substance Use Topics  . Alcohol use: No  . Drug use: No     Family Hx: The patient's family history includes Hypertension in his mother; Liver disease in his father.  ROS:   Please see the history of present illness.  All other systems reviewed and are negative.   Labs/Other Tests and Data Reviewed:    Recent Labs: 07/06/2018: Magnesium 2.2 08/23/2018: B Natriuretic Peptide 576.8 04/09/2019: BUN 55; Creatinine, Ser 6.50; Hemoglobin 11.4; Platelets 203; Potassium 4.4; Sodium 142   Recent Lipid Panel Lab Results  Component Value Date/Time   CHOL 182 03/05/2016 05:03 AM   TRIG 118 03/05/2016 05:03 AM   HDL 38 (L) 03/05/2016 05:03 AM   CHOLHDL 4.8 03/05/2016 05:03 AM   LDLCALC 120 (H) 03/05/2016 05:03 AM    Wt Readings from Last 3 Encounters:  04/10/19 296 lb (134.3 kg)  04/09/19 296 lb (134.3 kg)  03/15/19 290 lb (131.5 kg)     Objective:    Vital Signs:  Ht 5\' 7"  (1.702 m)   Wt 296 lb (134.3 kg)   BMI 46.36 kg/m    CONSTITUTIONAL:  Well nourished, well developed male in no acute distress.  EYES: anicteric MOUTH: oral mucosa is pink RESPIRATORY: Normal respiratory effort, symmetric expansion CARDIOVASCULAR: No peripheral edema SKIN: No rash, lesions or ulcers MUSCULOSKELETAL: no digital cyanosis NEURO: Cranial Nerves II-XII grossly intact, moves all extremities PSYCH: Intact judgement and insight.  A&O x 3, Mood/affect appropriate   ASSESSMENT & PLAN:    1.   Chronic systolic CHF -his last EF assessment was in 2016 showing an EF of 45 to 50%.  He tells me that his weight has been stable.  He has not had any shortness of breath or lower extremity edema.  He appears compensated.  He is NYHA class II CHF.  He will continue on carvedilol 25 mg twice daily.  He is not on ACE or ARB due to history of angioedema/CKD.  He will continue on Demadex 20 mg 2 tablets daily.  2.  ASCAD -status post remote PCI in 2005 at [facility].  He has not had any anginal symptoms.  Nuclear stress test in 2017 showed a large zone of nonreversible decreased perfusion in the inferior lateral wall extending into the inferior apex with myocardial thinning and LV dilatation.  There was no ischemia.  EF was 28%.  He has not had any anginal symptoms.  He will continue on aspirin 81 mg daily, statin and beta-blocker.  3.  Dilated cardiomyopathy/ischemic -his last EF assessment by echo was in 2016 at which time his EF was 45 to 50% although a nuclear stress test in 2017 showed EF of 28%.  He is on carvedilol 25 mg twice daily but he is not on an ACE or an arm due to history of angioedema.  I am going to repeat 2D echocardiogram to reassess LV function.  If his EF is below 45% then would recommend starting hydralazine/Imdur for preload and afterload reduction.  4.  Hypertension -he does not check his blood pressure at home.  He will continue on carvedilol 25 mg twice daily and amlodipine 10 mg daily.   5.  Hyperlipidemia - his LDL goal is < 70.  I will try to get his last LDL from PCP.  He will continue on atorvastatin 40 mg daily.  6.  CKD stage 5 -this is followed by nephrology.  His last creatinine was 6.5. He has an AVF in place.    7.  COVID-19 Education:The signs and symptoms of COVID-19 were discussed with the patient and how to seek care for testing (follow up with PCP or arrange E-visit).  The importance of social distancing was discussed today.  Patient Risk:   After full review of  this  patient's clinical status, I feel that they are at least moderate risk at this time.  Time:   Today, I have spent 20 minutes directly with the patient on video discussing medical problems including CAD, CHF, DCM, HTN, lipids.  We also reviewed the symptoms of COVID 19 and the ways to protect against contracting the virus with telehealth technology.  I spent an additional 15 minutes reviewing patient's chart including prior hospital notes, 2D echo, nuclear stress test, labs.  Medication Adjustments/Labs and Tests Ordered: Current medicines are reviewed at length with the patient today.  Concerns regarding medicines are outlined above.  Tests Ordered: No orders of the defined types were placed in this encounter.  Medication Changes: No orders of the defined types were placed in this encounter.   Disposition:  Follow up in 6 month(s)  Signed, Fransico Him, MD  04/10/2019 10:50 AM    [place]

## 2019-04-10 NOTE — Telephone Encounter (Signed)
Called and obtained verbal consent for virtual visit.     Virtual Visit Pre-Appointment Phone Call  "(Name), I am calling you today to discuss your upcoming appointment. We are currently trying to limit exposure to the virus that causes COVID-19 by seeing patients at home rather than in the office."  1. "What is the BEST phone number to call the day of the visit?" - include this in appointment notes  2. "Do you have or have access to (through a family member/friend) a smartphone with video capability that we can use for your visit?" a. If yes - list this number in appt notes as "cell" (if different from BEST phone #) and list the appointment type as a VIDEO visit in appointment notes b. If no - list the appointment type as a PHONE visit in appointment notes  3. Confirm consent - "In the setting of the current Covid19 crisis, you are scheduled for a (phone or video) visit with your provider on (date) at (time).  Just as we do with many in-office visits, in order for you to participate in this visit, we must obtain consent.  If you'd like, I can send this to your mychart (if signed up) or email for you to review.  Otherwise, I can obtain your verbal consent now.  All virtual visits are billed to your insurance company just like a normal visit would be.  By agreeing to a virtual visit, we'd like you to understand that the technology does not allow for your provider to perform an examination, and thus may limit your provider's ability to fully assess your condition. If your provider identifies any concerns that need to be evaluated in person, we will make arrangements to do so.  Finally, though the technology is pretty good, we cannot assure that it will always work on either your or our end, and in the setting of a video visit, we may have to convert it to a phone-only visit.  In either situation, we cannot ensure that we have a secure connection.  Are you willing to proceed?" STAFF: Did the patient  verbally acknowledge consent to telehealth visit? Document YES/NO here: YES  4. Advise patient to be prepared - "Two hours prior to your appointment, go ahead and check your blood pressure, pulse, oxygen saturation, and your weight (if you have the equipment to check those) and write them all down. When your visit starts, your provider will ask you for this information. If you have an Apple Watch or Kardia device, please plan to have heart rate information ready on the day of your appointment. Please have a pen and paper handy nearby the day of the visit as well."  5. Give patient instructions for MyChart download to smartphone OR Doximity/Doxy.me as below if video visit (depending on what platform provider is using)  6. Inform patient they will receive a phone call 15 minutes prior to their appointment time (may be from unknown caller ID) so they should be prepared to answer    TELEPHONE CALL NOTE  Victor Thompson has been deemed a candidate for a follow-up tele-health visit to limit community exposure during the Covid-19 pandemic. I spoke with the patient via phone to ensure availability of phone/video source, confirm preferred email & phone number, and discuss instructions and expectations.  I reminded Victor Thompson to be prepared with any vital sign and/or heart rhythm information that could potentially be obtained via home monitoring, at the time of his visit. I reminded Victor Thompson  to expect a phone call prior to his visit.  Victor Thompson, [name] 04/10/2019 9:43 AM   INSTRUCTIONS FOR DOWNLOADING THE MYCHART APP TO SMARTPHONE  - The patient must first make sure to have activated MyChart and know their login information - If Apple, go to [employer] and type in MyChart in the search bar and download the app. If Android, ask patient to go to [employer] and type in [place] in the search bar and download the app. The app is free but as with any other app downloads, their phone may require them to  verify saved payment information or Apple/Android password.  - The patient will need to then log into the app with their MyChart username and password, and select [facility] as their healthcare provider to link the account. When it is time for your visit, go to the MyChart app, find appointments, and click Begin Video Visit. Be sure to Select Allow for your device to access the Microphone and Camera for your visit. You will then be connected, and your provider will be with you shortly.  **If they have any issues connecting, or need assistance please contact MyChart service desk [phone])**  **If using a computer, in order to ensure the best quality for their visit they will need to use either of the following Internet Browsers: [employer], or Google Chrome**  IF USING DOXIMITY or DOXY.ME - The patient will receive a link just prior to their visit by text.     FULL LENGTH CONSENT FOR TELE-HEALTH VISIT   I hereby voluntarily request, consent and authorize [facility] and its employed or contracted physicians, physician assistants, nurse practitioners or other licensed health care professionals (the Practitioner), to provide me with telemedicine health care services (the "Services") as deemed necessary by the treating Practitioner. I acknowledge and consent to receive the Services by the Practitioner via telemedicine. I understand that the telemedicine visit will involve communicating with the Practitioner through live audiovisual communication technology and the disclosure of certain medical information by electronic transmission. I acknowledge that I have been given the opportunity to request an in-person assessment or other available alternative prior to the telemedicine visit and am voluntarily participating in the telemedicine visit.  I understand that I have the right to withhold or withdraw my consent to the use of telemedicine in the course of my care at any time,  without affecting my right to future care or treatment, and that the Practitioner or I may terminate the telemedicine visit at any time. I understand that I have the right to inspect all information obtained and/or recorded in the course of the telemedicine visit and may receive copies of available information for a reasonable fee.  I understand that some of the potential risks of receiving the Services via telemedicine include:  Marland Kitchen Delay or interruption in medical evaluation due to technological equipment failure or disruption; . Information transmitted may not be sufficient (e.g. poor resolution of images) to allow for appropriate medical decision making by the Practitioner; and/or  . In rare instances, security protocols could fail, causing a breach of personal health information.  Furthermore, I acknowledge that it is my responsibility to provide information about my medical history, conditions and care that is complete and accurate to the best of my ability. I acknowledge that Practitioner's advice, recommendations, and/or decision may be based on factors not within their control, such as incomplete or inaccurate data provided by me or distortions of diagnostic images or specimens  that may result from electronic transmissions. I understand that the practice of medicine is not an exact science and that Practitioner makes no warranties or guarantees regarding treatment outcomes. I acknowledge that I will receive a copy of this consent concurrently upon execution via email to the email address I last provided but may also request a printed copy by calling the office of [facility].    I understand that my insurance will be billed for this visit.   I have read or had this consent read to me. . I understand the contents of this consent, which adequately explains the benefits and risks of the Services being provided via telemedicine.  . I have been provided ample opportunity to ask questions regarding  this consent and the Services and have had my questions answered to my satisfaction. . I give my informed consent for the services to be provided through the use of telemedicine in my medical care  By participating in this telemedicine visit I agree to the above.

## 2019-04-10 NOTE — Patient Instructions (Signed)
Medication Instructions:  Your physician recommends that you continue on your current medications as directed. Please refer to the Current Medication list given to you today.  If you need a refill on your cardiac medications before your next appointment, please call your pharmacy.   Lab work: Fasting Labs: Lipid, Liver and BMET same day as echo in July.   If you have labs (blood work) drawn today and your tests are completely normal, you will receive your results only by: Marland Kitchen MyChart Message (if you have MyChart) OR . A paper copy in the mail If you have any lab test that is abnormal or we need to change your treatment, we will call you to review the results.  Testing/Procedures: Your physician has requested that you have an echocardiogram around July. Echocardiography is a painless test that uses sound waves to create images of your heart. It provides your doctor with information about the size and shape of your heart and how well your heart's chambers and valves are working. This procedure takes approximately one hour. There are no restrictions for this procedure.  Follow-Up: At [facility], you and your health needs are our priority.  As part of our continuing mission to provide you with exceptional heart care, we have created designated Provider Care Teams.  These Care Teams include your primary Cardiologist (physician) and Advanced Practice Providers (APPs -  Physician Assistants and Nurse Practitioners) who all work together to provide you with the care you need, when you need it. You will need a follow up appointment in 6 months.  Please call our office 2 months in advance to schedule this appointment.  You may see Dr. Radford Pax or one of the following Advanced Practice Providers on your designated Care Team:   [place], PA-C Melina Copa, PA-C . Ermalinda Barrios, PA-C

## 2019-04-16 MED FILL — CARVEDILOL 25 MG TABLET: 25 | 30 days supply | Qty: 60 | Fill #4

## 2019-04-24 ENCOUNTER — Telehealth (HOSPITAL_COMMUNITY): Payer: Self-pay | Admitting: Rehabilitation

## 2019-04-24 NOTE — Telephone Encounter (Signed)
The above patient or their representative was contacted and gave the following answers to these questions:         Do you have any of the following symptoms? No  Fever                    Cough                   Shortness of breath  Do  you have any of the following other symptoms? No   muscle pain         vomiting,        diarrhea        rash         weakness        red eye        abdominal pain         bruising          bruising or bleeding              joint pain           severe headache    Have you been in contact with someone who was or has been sick in the past 2 weeks? No  Yes                 Unsure                         Unable to assess   Does the person that you were in contact with have any of the following symptoms?   Cough         shortness of breath           muscle pain         vomiting,            diarrhea            rash            weakness           fever            red eye           abdominal pain           bruising  or  bleeding                joint pain                severe headache               Have you  or someone you have been in contact with traveled internationally in th last month?  No       If yes, which countries?   Have you  or someone you have been in contact with traveled outside [place] in th last month? No        If yes, which state and city?   COMMENTS OR ACTION PLAN FOR THIS PATIENT:  Pt instructed to wear mask to appt and come alone.

## 2019-04-25 ENCOUNTER — Encounter: Payer: Self-pay | Admitting: Vascular Surgery

## 2019-04-25 ENCOUNTER — Ambulatory Visit (INDEPENDENT_AMBULATORY_CARE_PROVIDER_SITE_OTHER): Payer: Self-pay | Admitting: Vascular Surgery

## 2019-04-25 ENCOUNTER — Other Ambulatory Visit: Payer: Self-pay

## 2019-04-25 ENCOUNTER — Ambulatory Visit (HOSPITAL_COMMUNITY)
Admission: RE | Admit: 2019-04-25 | Discharge: 2019-04-25 | Disposition: A | Discharge status: Home / Self Care | Payer: BLUE CROSS/BLUE SHIELD | Source: Ambulatory Visit | Attending: Vascular Surgery | Admitting: Vascular Surgery

## 2019-04-25 VITALS — BP 135/88 | HR 69 | Temp 97.4°F | Resp 20 | Ht 67.0 in | Wt 292.0 lb

## 2019-04-25 DIAGNOSIS — N185 Chronic kidney disease, stage 5: Secondary | ICD-10-CM | POA: Diagnosis present

## 2019-04-25 DIAGNOSIS — N184 Chronic kidney disease, stage 4 (severe): Secondary | ICD-10-CM

## 2019-04-25 NOTE — Progress Notes (Signed)
[name] MRN: [id number] DOB: October 15, 1961 Sex: male  REASON FOR VISIT:   Follow-up after right brachiocephalic fistula  HPI:   Victor Thompson is a pleasant 58 y.o. male who had a right brachiocephalic fistula placed on 03/15/2019.  He comes in for 6-week follow-up visit.  He occasionally gets some tingling in his hand but this does not happen often.  He has no specific complaints.  He is not on dialysis.  He said no recent uremic symptoms.  Specifically, he denies nausea, vomiting, fatigue, anorexia, or palpitations.  Current Outpatient Medications  Medication Sig Dispense Refill  . albuterol (PROVENTIL HFA;VENTOLIN HFA) 108 (90 Base) MCG/ACT inhaler Inhale 2 puffs into the lungs every 4 (four) hours as needed for wheezing or shortness of breath. 54 g 3  . amLODipine (NORVASC) 10 MG tablet Take 1 tablet (10 mg total) by mouth daily. 30 tablet 3  . aspirin EC 81 MG tablet Take 1 tablet (81 mg total) by mouth daily. 30 tablet 5  . atorvastatin (LIPITOR) 40 MG tablet Take 1 tablet (40 mg total) by mouth at bedtime. 30 tablet 6  . calcitRIOL (ROCALTROL) 0.5 MCG capsule Take 0.5 mcg by mouth daily.   11  . carvedilol (COREG) 25 MG tablet Take 1 tablet (25 mg total) by mouth 2 (two) times daily with a meal. 60 tablet 5  . Cholecalciferol (VITAMIN D-3) 125 MCG (5000 UT) TABS Take 5,000 Units by mouth daily.     . cyclobenzaprine (FLEXERIL) 10 MG tablet TAKE 1 TABLET (10 MG TOTAL) BY MOUTH AT BEDTIME. AS NEEDED FOR PAIN (Patient taking differently: Take 10 mg by mouth at bedtime as needed for muscle spasms. ) 30 tablet 0  . EPINEPHrine 0.3 mg/0.3 mL IJ SOAJ injection Inject 0.3 mLs into the muscle once as needed for anaphylaxis.     . fluticasone (FLONASE) 50 MCG/ACT nasal spray Place 1 spray into both nostrils daily. (Patient taking differently: Place 1 spray into both nostrils daily as needed for allergies. ) 16 g 2  . loratadine (CLARITIN) 10 MG tablet Take 10 mg by mouth daily as  needed for allergies.    Marland Kitchen PRESCRIPTION MEDICATION Inhale into the lungs at bedtime. CPAP    . torsemide (DEMADEX) 20 MG tablet Take 2 tablets (40 mg total) by mouth daily. 90 tablet 2   No current facility-administered medications for this visit.     REVIEW OF SYSTEMS:  [X]  denotes positive finding, [ ]  denotes negative finding Vascular    Leg swelling    Cardiac    Chest pain or chest pressure:    Shortness of breath upon exertion:    Short of breath when lying flat:    Irregular heart rhythm:    Constitutional    Fever or chills:     PHYSICAL EXAM:   Vitals:   04/25/19 1033  BP: 135/88  Pulse: 69  Resp: 20  Temp: (!) 97.4 F (36.3 C)  SpO2: 99%  Weight: 292 lb (132.5 kg)  Height: 5\' 7"  (1.702 m)    GENERAL: The patient is a well-nourished male, in no acute distress. The vital signs are documented above. CARDIOVASCULAR: There is a regular rate and rhythm. PULMONARY: There is good air exchange bilaterally without wheezing or rales. He has an excellent thrill in his right upper arm fistula.  His incisions are healing nicely.  DATA:   DUPLEX AV FISTULA: I have independently interpreted the duplex of the AV fistula.  The diameters of  the fistula ranged from 0.57-0.69 cm.  The vein is somewhat deep.  There are elevated velocities at the antecubital level.  MEDICAL ISSUES:   STATUS POST RIGHT BRACHIOCEPHALIC FISTULA: The fistula appears to be maturing adequately.  It is slightly deep.  If he needs this for dialysis and the depth is an issue then certainly we could consider superficialization.  I will see him back as needed.  Deitra Mayo [facility] [phone]

## 2019-04-26 ENCOUNTER — Emergency Department (HOSPITAL_COMMUNITY)
Admission: EM | Admit: 2019-04-26 | Discharge: 2019-04-27 | Disposition: A | Discharge status: Home / Self Care | Payer: BLUE CROSS/BLUE SHIELD | Attending: Emergency Medicine | Admitting: Emergency Medicine

## 2019-04-26 ENCOUNTER — Emergency Department (HOSPITAL_COMMUNITY): Payer: BLUE CROSS/BLUE SHIELD

## 2019-04-26 ENCOUNTER — Other Ambulatory Visit: Payer: Self-pay

## 2019-04-26 ENCOUNTER — Encounter (HOSPITAL_COMMUNITY): Payer: Self-pay | Admitting: Emergency Medicine

## 2019-04-26 DIAGNOSIS — Z79899 Other long term (current) drug therapy: Secondary | ICD-10-CM | POA: Insufficient documentation

## 2019-04-26 DIAGNOSIS — Z95810 Presence of automatic (implantable) cardiac defibrillator: Secondary | ICD-10-CM | POA: Insufficient documentation

## 2019-04-26 DIAGNOSIS — I252 Old myocardial infarction: Secondary | ICD-10-CM | POA: Diagnosis not present

## 2019-04-26 DIAGNOSIS — Z7982 Long term (current) use of aspirin: Secondary | ICD-10-CM | POA: Insufficient documentation

## 2019-04-26 DIAGNOSIS — J441 Chronic obstructive pulmonary disease with (acute) exacerbation: Secondary | ICD-10-CM | POA: Diagnosis not present

## 2019-04-26 DIAGNOSIS — R609 Edema, unspecified: Secondary | ICD-10-CM | POA: Diagnosis not present

## 2019-04-26 DIAGNOSIS — N185 Chronic kidney disease, stage 5: Secondary | ICD-10-CM | POA: Insufficient documentation

## 2019-04-26 DIAGNOSIS — I5042 Chronic combined systolic (congestive) and diastolic (congestive) heart failure: Secondary | ICD-10-CM | POA: Diagnosis not present

## 2019-04-26 DIAGNOSIS — I132 Hypertensive heart and chronic kidney disease with heart failure and with stage 5 chronic kidney disease, or end stage renal disease: Secondary | ICD-10-CM | POA: Diagnosis not present

## 2019-04-26 DIAGNOSIS — R0602 Shortness of breath: Secondary | ICD-10-CM | POA: Diagnosis present

## 2019-04-26 LAB — CBC
HCT: 34.2 % — ABNORMAL LOW (ref 39.0–52.0)
Hemoglobin: 11.1 g/dL — ABNORMAL LOW (ref 13.0–17.0)
MCH: 30 pg (ref 26.0–34.0)
MCHC: 32.5 g/dL (ref 30.0–36.0)
MCV: 92.4 fL (ref 80.0–100.0)
Platelets: 170 10*3/uL (ref 150–400)
RBC: 3.7 MIL/uL — ABNORMAL LOW (ref 4.22–5.81)
RDW: 15 % (ref 11.5–15.5)
WBC: 7.5 10*3/uL (ref 4.0–10.5)
nRBC: 0 % (ref 0.0–0.2)

## 2019-04-26 LAB — TROPONIN I: Troponin I: 0.06 ng/mL (ref <0.03)

## 2019-04-26 LAB — BASIC METABOLIC PANEL
Anion gap: 14 (ref 5–15)
BUN: 66 mg/dL — ABNORMAL HIGH (ref 6–20)
CO2: 20 mmol/L — ABNORMAL LOW (ref 22–32)
Calcium: 8.8 mg/dL — ABNORMAL LOW (ref 8.9–10.3)
Chloride: 108 mmol/L (ref 98–111)
Creatinine, Ser: 6.91 mg/dL — ABNORMAL HIGH (ref 0.61–1.24)
GFR calc Af Amer: 9 mL/min — ABNORMAL LOW (ref >60)
GFR calc non Af Amer: 8 mL/min — ABNORMAL LOW (ref >60)
Glucose, Bld: 117 mg/dL — ABNORMAL HIGH (ref 70–99)
Potassium: 4.1 mmol/L (ref 3.5–5.1)
Sodium: 142 mmol/L (ref 135–145)

## 2019-04-26 MED ORDER — SODIUM CHLORIDE 0.9% FLUSH: 3.0000 mL | Freq: Once | INTRAVENOUS | Status: DC

## 2019-04-26 MED FILL — ATORVASTATIN CALCIUM 40 MG: 40 | 30 days supply | Qty: 30 | Fill #5

## 2019-04-26 NOTE — ED Triage Notes (Signed)
Reports SOB since yesterday morning.  Reports being seen here previously for the same.  Patient has fistula in R arm.  Has not started dialysis yet. Denies CP, n/v or fever.  Reports inhaler is not helping at home.

## 2019-04-27 LAB — BRAIN NATRIURETIC PEPTIDE: B Natriuretic Peptide: 427.8 pg/mL — ABNORMAL HIGH (ref 0.0–100.0)

## 2019-04-27 LAB — TROPONIN I: Troponin I: 0.06 ng/mL (ref <0.03)

## 2019-04-27 MED ORDER — ALBUTEROL SULFATE HFA 108 (90 BASE) MCG/ACT IN AERS
8.0000 | INHALATION_SPRAY | Freq: Once | RESPIRATORY_TRACT | Status: AC
  Administered 2019-04-27: 8 via RESPIRATORY_TRACT
  Filled 2019-04-27: qty 6.7

## 2019-04-27 MED ORDER — AEROCHAMBER PLUS FLO-VU LARGE MISC
Status: AC
  Filled 2019-04-27: qty 1

## 2019-04-27 MED ORDER — AEROCHAMBER PLUS FLO-VU LARGE MISC
1.0000 | Freq: Once | Status: AC
  Administered 2019-04-27: 01:00:00 1

## 2019-04-27 MED ORDER — PREDNISONE 20 MG PO TABS: 40.0000 mg | ORAL_TABLET | Freq: Every day | ORAL | 0 refills | Status: DC

## 2019-04-27 MED ORDER — ALBUTEROL SULFATE HFA 108 (90 BASE) MCG/ACT IN AERS
8.0000 | INHALATION_SPRAY | Freq: Once | RESPIRATORY_TRACT | Status: AC
  Administered 2019-04-27: 8 via RESPIRATORY_TRACT

## 2019-04-27 MED ORDER — PREDNISONE 20 MG PO TABS
60.0000 mg | ORAL_TABLET | Freq: Once | ORAL | Status: AC
  Administered 2019-04-27: 60 mg via ORAL
  Filled 2019-04-27: qty 3

## 2019-04-27 NOTE — ED Provider Notes (Signed)
[facility] EMERGENCY DEPARTMENT Provider Note   CSN: [id number] Arrival date & time: 04/26/19  2220    History   Chief Complaint Chief Complaint  Patient presents with   Shortness of Breath    HPI Victor Thompson is a 58 y.o. male.     Patient with past medical history remarkable for COPD, CKD, not yet on dialysis, CHF, CAD, presents to the emergency department with a chief complaint of shortness of breath.  He states his symptoms started yesterday morning at about 4 AM.  He reports some dry cough.  Denies any fevers or chills.  Denies any new swelling on his extremities.  Symptoms are worsened with exertion.  Denies any treatments at home.  States that he takes a fluid pill.  He still makes urine.  The history is provided by the patient. No language interpreter was used.    Past Medical History:  Diagnosis Date   AICD (automatic cardioverter/defibrillator) present    2015   Asthma    no meds   Cardiac arrest ([place]) 2015   CHF (congestive heart failure) (HCC)    Chronic kidney disease    ckd -stage 5   Coronary artery disease    Hypertension    Myocardial infarction ([place])    Obesity    Pneumonia    Pulmonary edema    Shortness of breath dyspnea    Sleep apnea    USES CPAP   Wears glasses     Patient Active Problem List   Diagnosis Date Noted   CAD (coronary artery disease), native coronary artery 04/10/2019   CKD (chronic kidney disease) stage 5, GFR less than 15 ml/min ([place]) 09/26/2018   Chronic midline low back pain without sciatica 02/17/2018   COPD exacerbation ([place]) 03/04/2016   Cardiac arrest with ventricular fibrillation ([place]) 03/04/2016   Defibrillator discharge    Chronic combined systolic and diastolic CHF (congestive heart failure) ([place]) 01/22/2016   Erectile dysfunction 01/22/2016   Allergic rhinitis 01/22/2016   GERD (gastroesophageal reflux disease) 01/22/2016   PROTEINURIA 02/17/2010   Cardiomyopathy,  ischemic 02/16/2010   Obstructive sleep apnea 02/16/2010   EXTERNAL HEMORRHOIDS 09/26/2009   Tobacco use disorder in remission 06/03/2009   Essential hypertension 10/01/2008   Hyperlipidemia 07/14/2007   OBESITY 07/14/2007   Asthma 07/14/2007    Past Surgical History:  Procedure Laterality Date   AV FISTULA PLACEMENT Right 03/15/2019   Procedure: RIGHT ARM ARTERIOVENOUS (AV) FISTULA CREATION;  Surgeon: Angelia Mould, MD;  Location: [place];  Service: Vascular;  Laterality: Right;   COLONOSCOPY     COLONOSCOPY W/ BIOPSIES AND POLYPECTOMY     CORONARY STENT PLACEMENT  2007   IMPLANTABLE CARDIOVERTER DEFIBRILLATOR IMPLANT  2015        Home Medications    Prior to Admission medications   Medication Sig Start Date End Date Taking? Authorizing Provider  albuterol (PROVENTIL HFA;VENTOLIN HFA) 108 (90 Base) MCG/ACT inhaler Inhale 2 puffs into the lungs every 4 (four) hours as needed for wheezing or shortness of breath. 02/06/16   Charlott Rakes, MD  amLODipine (NORVASC) 10 MG tablet Take 1 tablet (10 mg total) by mouth daily. 09/26/18   [name], MD  aspirin EC 81 MG tablet Take 1 tablet (81 mg total) by mouth daily. 01/22/16   [name], [name], MD  atorvastatin (LIPITOR) 40 MG tablet Take 1 tablet (40 mg total) by mouth at bedtime. 09/26/18   [name], MD  calcitRIOL (ROCALTROL) 0.5 MCG capsule  Take 0.5 mcg by mouth daily.  08/16/18   [provider]  carvedilol (COREG) 25 MG tablet Take 1 tablet (25 mg total) by mouth 2 (two) times daily with a meal. 08/23/18   Jola Schmidt, MD  Cholecalciferol (VITAMIN D-3) 125 MCG (5000 UT) TABS Take 5,000 Units by mouth daily.     [provider]  cyclobenzaprine (FLEXERIL) 10 MG tablet TAKE 1 TABLET (10 MG TOTAL) BY MOUTH AT BEDTIME. AS NEEDED FOR PAIN Patient taking differently: Take 10 mg by mouth at bedtime as needed for muscle spasms.  12/21/18   [name], MD  EPINEPHrine 0.3 mg/0.3 mL  IJ SOAJ injection Inject 0.3 mLs into the muscle once as needed for anaphylaxis.  02/06/16   [provider]  fluticasone (FLONASE) 50 MCG/ACT nasal spray Place 1 spray into both nostrils daily. Patient taking differently: Place 1 spray into both nostrils daily as needed for allergies.  12/31/18   Volanda Napoleon, PA-C  loratadine (CLARITIN) 10 MG tablet Take 10 mg by mouth daily as needed for allergies.    [provider]  PRESCRIPTION MEDICATION Inhale into the lungs at bedtime. CPAP    [provider]  torsemide (DEMADEX) 20 MG tablet Take 2 tablets (40 mg total) by mouth daily. 09/26/18   [name], MD    Family History Family History  Problem Relation Age of Onset   Hypertension Mother    Liver disease Father     Social History Social History   Tobacco Use   Smoking status: Never Smoker   Smokeless tobacco: Never Used  Substance Use Topics   Alcohol use: No   Drug use: No     Allergies   Ace inhibitors; Influenza vaccines; Ketorolac; Lidocaine; Lisinopril; and Penicillins   Review of Systems Review of Systems  All other systems reviewed and are negative.    Physical Exam Updated Vital Signs BP (!) 156/86 (BP Location: Left Arm)    Pulse 86    Temp 98.2 F (36.8 C) (Oral)    Resp 20    Ht 5\' 7"  (1.702 m)    Wt 132.4 kg    SpO2 98%    BMI 45.72 kg/m   Physical Exam Vitals signs and nursing note reviewed.  Constitutional:      Appearance: He is well-developed.  HENT:     Head: Normocephalic and atraumatic.  Eyes:     Conjunctiva/sclera: Conjunctivae normal.  Neck:     Musculoskeletal: Neck supple.  Cardiovascular:     Rate and Rhythm: Normal rate and regular rhythm.     Heart sounds: No murmur.  Pulmonary:     Effort: Pulmonary effort is normal. No respiratory distress.     Breath sounds: Wheezing present.     Comments: Diffuse end expiratory wheezes Abdominal:     Palpations: Abdomen is soft.     Tenderness:  There is no abdominal tenderness.  Musculoskeletal:     Comments: 1+ bilateral pitting edema in lower extremities  Skin:    General: Skin is warm and dry.  Neurological:     Mental Status: He is alert and oriented to person, place, and time.  Psychiatric:        Mood and Affect: Mood normal.        Behavior: Behavior normal.      ED Treatments / Results  Labs (all labs ordered are listed, but only abnormal results are displayed) Labs Reviewed  BASIC METABOLIC PANEL - Abnormal; Notable  for the following components:      Result Value   CO2 20 (*)    Glucose, Bld 117 (*)    BUN 66 (*)    Creatinine, Ser 6.91 (*)    Calcium 8.8 (*)    GFR calc non Af Amer 8 (*)    GFR calc Af Amer 9 (*)    All other components within normal limits  CBC - Abnormal; Notable for the following components:   RBC 3.70 (*)    Hemoglobin 11.1 (*)    HCT 34.2 (*)    All other components within normal limits  TROPONIN I - Abnormal; Notable for the following components:   Troponin I 0.06 (*)    All other components within normal limits    EKG EKG Interpretation  Date/Time:  Friday Apr 27 2019 03:00:17 EDT Ventricular Rate:  77 PR Interval:    QRS Duration: 154 QT Interval:  454 QTC Calculation: 514 R Axis:   -61 Text Interpretation:  Sinus rhythm Ventricular premature complex Nonspecific IVCD with LAD No significant change since last tracing Confirmed by Pryor Curia [id number]) on 04/27/2019 3:25:23 AM   Radiology Dg Chest 2 View  Result Date: 04/26/2019 CLINICAL DATA:  Chest pain EXAM: CHEST - 2 VIEW COMPARISON:  04/09/2019, 08/23/2018 FINDINGS: Similar appearance of left-sided cardiac pacing device. Mild cardiomegaly. Bronchitic changes. No consolidation or effusion. No pneumothorax. IMPRESSION: Cardiomegaly with mild central congestion.  Bronchitic changes. Electronically Signed   By: Donavan Foil M.D.   On: 04/26/2019 23:18   Vas US Duplex Dialysis Access (avf,avg)  Result Date:  04/25/2019 DIALYSIS ACCESS Reason for Exam: Routine follow up. Access Site: Right Upper Extremity. Access Type: Brachial-cephalic AVF. History: Placed 03/15/19. Performing Technologist: Ralene Cork RVT  Examination Guidelines: A complete evaluation includes B-mode imaging, spectral Doppler, color Doppler, and power Doppler as needed of all accessible portions of each vessel. Unilateral testing is considered an integral part of a complete examination. Limited examinations for reoccurring indications may be performed as noted.  Findings: +--------------------+----------+-----------------+--------+  AVF                  PSV (cm/s) Flow Vol (mL/min) Comments  +--------------------+----------+-----------------+--------+  Native artery inflow    164            889                  +--------------------+----------+-----------------+--------+  AVF Anastomosis         300                                 +--------------------+----------+-----------------+--------+  +------------+----------+-------------+----------+-------------------+  OUTFLOW VEIN PSV (cm/s) Diameter (cm) Depth (cm)      Describe        +------------+----------+-------------+----------+-------------------+  Prox UA         118         0.57         0.94                         +------------+----------+-------------+----------+-------------------+  Mid UA          172         0.63         1.00                         +------------+----------+-------------+----------+-------------------+  Dist UA  170         0.69         0.81                         +------------+----------+-------------+----------+-------------------+  AC Fossa        517         0.67         0.59    partially-occlusive  +------------+----------+-------------+----------+-------------------+   Summary: Patent brachio-cephalic AVF with non occlusive thrombus noted in the outflow vein at the ante cubitus. No significant branches. Distal radial artery is antegrade and triphasic.  *See  table(s) above for measurements and observations.  Diagnosing physician: Deitra Mayo MD Electronically signed by Deitra Mayo MD on 04/25/2019 at 10:49:09 AM.   --------------------------------------------------------------------------------   Final     Procedures Procedures (including critical care time)  Medications Ordered in ED Medications  sodium chloride flush (NS) 0.9 % injection 3 mL (has no administration in time range)  albuterol (VENTOLIN HFA) 108 (90 Base) MCG/ACT inhaler 8 puff (has no administration in time range)  AeroChamber Plus Flo-Vu Large MISC 1 each (has no administration in time range)     Initial Impression / Assessment and Plan / ED Course  I have reviewed the triage vital signs and the nursing notes.  Pertinent labs & imaging results that were available during my care of the patient were reviewed by me and considered in my medical decision making (see chart for details).        Patient with shortness of breath and wheezing.  History of CHF and COPD.  Will give albuterol inhaler with spacer.  Patient does have pitting edema on his lower extremities.  Will check BNP.  Chest x-ray shows mild vascular congestion.  May need additional diuresis, however he does have stage V kidney disease.  Additionally, troponin is elevated at 0.06.  Prior labs reviewed, patient chronically has mildly elevated troponin.  Patient feels significantly improved after albuterol.  Has taken prednisone and done well with it in the past.  BNP is baseline.  Troponin is also chronically elevated, and repeat is unchanged.  I doubt ACS.  Doubt CHF exacerbation.  I believe this is likely the patient's COPD.  Patient ambulates maintaining 100% pulse oxygenation.  He states that he feels improved and would like to go home.  I feel that this is appropriate.  I discussed the case with Dr. Leonides Schanz, who agrees with the plan.  Final Clinical Impressions(s) / ED Diagnoses   Final diagnoses:   COPD exacerbation [facility])    ED Discharge Orders         Ordered    predniSONE (DELTASONE) 20 MG tablet  Daily     04/27/19 0412           Paola, Flynt, PA-C 04/27/19 Ogilvie, Delice Bison, DO 04/27/19 [phone]

## 2019-04-27 NOTE — ED Notes (Signed)
Pt. ambulated with pulse ox with sats.consistently at 100% & good pleth. Pt. denies discomfort & increased SOB

## 2019-04-27 NOTE — ED Notes (Signed)
Patient verbalized understanding of dc instructions, vss, ambulatory with nad.   

## 2019-04-27 NOTE — ED Notes (Signed)
Lab to add on BNP.  °

## 2019-05-03 ENCOUNTER — Other Ambulatory Visit: Payer: Self-pay | Admitting: Internal Medicine

## 2019-05-03 DIAGNOSIS — I10 Essential (primary) hypertension: Secondary | ICD-10-CM

## 2019-05-03 MED FILL — AMLODIPINE BESYLATE 10 MG T: 10 | 30 days supply | Qty: 30 | Fill #0

## 2019-05-11 MED FILL — CALCITRIOL 0.5 MCG CAPS: 0.5 | 30 days supply | Qty: 30 | Fill #5

## 2019-05-11 MED FILL — TORSEMIDE 20 MG TABLET: 20 | 30 days supply | Qty: 60 | Fill #3

## 2019-05-30 MED FILL — CARVEDILOL 25 MG TABLET: 25 | 30 days supply | Qty: 60 | Fill #5

## 2019-06-06 MED FILL — ATORVASTATIN CALCIUM 40 MG: 40 | 30 days supply | Qty: 30 | Fill #6

## 2019-06-07 ENCOUNTER — Other Ambulatory Visit: Payer: Self-pay

## 2019-06-07 ENCOUNTER — Ambulatory Visit (HOSPITAL_COMMUNITY): Payer: BC Managed Care – PPO | Attending: Cardiology

## 2019-06-07 DIAGNOSIS — I5042 Chronic combined systolic (congestive) and diastolic (congestive) heart failure: Secondary | ICD-10-CM | POA: Diagnosis not present

## 2019-06-11 ENCOUNTER — Telehealth: Payer: Self-pay

## 2019-06-11 DIAGNOSIS — I5042 Chronic combined systolic (congestive) and diastolic (congestive) heart failure: Secondary | ICD-10-CM

## 2019-06-11 MED ORDER — HYDRALAZINE HCL 10 MG PO TABS: 10.0000 mg | ORAL_TABLET | Freq: Three times a day (TID) | ORAL | 11 refills | Status: DC

## 2019-06-11 MED ORDER — ISOSORBIDE MONONITRATE ER 30 MG PO TB24: 30.0000 mg | ORAL_TABLET | Freq: Every day | ORAL | 11 refills | Status: DC

## 2019-06-11 MED FILL — hydrALAZINE HCL 10 MG TABS: 10 | 30 days supply | Qty: 90 | Fill #0

## 2019-06-11 MED FILL — ISOSORBIDE MN ER 30 MG TAB: 30 | 30 days supply | Qty: 30 | Fill #0

## 2019-06-11 NOTE — Telephone Encounter (Signed)
Spoke with the patient, he accepted taking hydralazine and Imdur. He agreed to check his blood pressure twice a day. He is scheduled on 06/26/19 for a 2 week follow up.

## 2019-06-11 NOTE — Telephone Encounter (Signed)
Notes recorded by Sueanne Margarita, MD on 06/11/2019 at 8:19 AM EDT  Please let patient know that heart function is severely reduced With mildly thickened heart muscle and stiff heart muscle. EF remains low so would like to start Hydralazine 10mg  TID and Imdur 30mg  daily and followup with PA in 2 weeks for uptitration of HF meds and repeat echo in 3 months. Last nuclear stress test 2017 with no ischemia and EF 28%.

## 2019-06-18 ENCOUNTER — Ambulatory Visit: Payer: Self-pay | Admitting: *Deleted

## 2019-06-18 NOTE — Telephone Encounter (Signed)
Patient is calling to get COVID testing The patient states that he has been unable to reach his PCP for 3 days. And has had exposure to someone that tested postived for COVID. Please advise. Thank you.   Returned call to patient and discussed with him the ways that he can get tested. First one would be with having a visit with his provider and getting it scheduled that way. Or he could try a pop up testing site. He wanted to go with the pop up site. Information was given to him regarding testing. He voiced understanding.

## 2019-06-20 ENCOUNTER — Other Ambulatory Visit: Payer: Self-pay | Admitting: Internal Medicine

## 2019-06-20 DIAGNOSIS — I10 Essential (primary) hypertension: Secondary | ICD-10-CM

## 2019-06-20 MED FILL — CALCITRIOL 0.5 MCG CAPS: 0.5 | 30 days supply | Qty: 30 | Fill #6

## 2019-06-20 MED FILL — TORSEMIDE 20 MG TABLET: 20 | 30 days supply | Qty: 60 | Fill #4

## 2019-06-25 ENCOUNTER — Telehealth: Payer: Self-pay

## 2019-06-25 NOTE — Telephone Encounter (Signed)
Left message regarding appt on 07/02/19. 

## 2019-06-26 ENCOUNTER — Ambulatory Visit: Payer: BC Managed Care – PPO | Admitting: Cardiology

## 2019-06-26 NOTE — Progress Notes (Deleted)
06/26/2019 Victor Thompson   1961/09/27  [id number]  Primary Physician [name], MD Primary Cardiologist: Dr. Radford Pax Electrophysiologist: None   Reason for Visit/CC: F/u for Chronic Systolic HF; medication management  HPI:   Victor Thompson is a 58 y/o male, presenting to clinic today for f/u for chronic systolic HF/ medication management. He was previously followed by Dr. Doylene Canard, but recently transferred care to our practice and had initial visit w/ Dr. Radford Pax in May. He has a history of ischemic dilated cardiomyopathy with cardiac arrest status post ICD at [facility] in 2015 and is now followed in our device clinic. He has a history of ASCAD status post PCI in 2005.  He has chronic systolic CHF, morbid obesity, OSA on CPAP, chronic kidney disease, hypertension and hyperlipidemia.  Echo in 2016 showed mildly reduced LV systolic function with EF 45 to 50% with moderate hypokinesis of the anterior myocardium and mild hypokinesis of the inferior myocardium.  There is grade 1 diastolic dysfunction.  The left atrium was moderately to severely dilated and there was mild MR. He also has a history of angioedema with no clear trigger, thus not on ACEi/ARB. He had a recent echo 06/07/19 that showed further reduction in LVEF, now severely reduced at 25-30%, mild LVH, severe LVE, moderate diastolic dysfunction and mild MR. Dr. Radford Pax added Imdur and hydralazine and pt advised to f/u in 2 weeks w/ APP.   Today in f/u, he reports    Cardiac Studies  2D Echo 06/07/19 IMPRESSIONS    1. The left ventricle has severely reduced systolic function, with an ejection fraction of 25-30%. The cavity size was severely dilated. There is mildly increased left ventricular wall thickness. Left ventricular diastolic Doppler parameters are  consistent with pseudonormalization. Elevated mean left atrial pressure Left ventricular diffuse hypokinesis.  2. The right ventricle has normal systolic function. The cavity  was normal.  3. The mitral valve is grossly normal. Mild thickening of the mitral valve leaflet.  4. The aortic valve is tricuspid. Mild thickening of the aortic valve. No stenosis of the aortic valve.  5. Severe LV dysfunction; mild LVH; severe LVE; moderate diastolic dysfunction; mild MR.   No outpatient medications have been marked as taking for the 06/26/19 encounter (Appointment) with Consuelo Pandy, PA-C.   Allergies  Allergen Reactions  . Ace Inhibitors Swelling    Swelling of the tongue  . Influenza Vaccines Hives and Swelling    SWELLING REACTION UNSPECIFIED   . Ketorolac Swelling    SWELLING REACTION UNSPECIFIED   . Lidocaine Swelling    TONGUE SWELLS  . Lisinopril Swelling    TONGUE SWELLING Pt reported problem with a BP med which sounded like  lisinopril  But as of 09/19/06,pt had tolerated altace without problem  . Penicillins Swelling    TONGUE SWELLS Has patient had a PCN reaction causing immediate rash, facial/tongue/throat swelling, SOB or lightheadedness with hypotension: Yes Has patient had a PCN reaction causing severe rash involving mucus membranes or skin necrosis: No Has patient had a PCN reaction that required hospitalization No Has patient had a PCN reaction occurring within the last 10 years: Yes If all of the above answers are "NO", then may proceed with Cephalosporin use.    Past Medical History:  Diagnosis Date  . AICD (automatic cardioverter/defibrillator) present    2015  . Asthma    no meds  . Cardiac arrest ([place]) 2015  . CHF (congestive heart failure) ([place])   . Chronic  kidney disease    ckd -stage 5  . Coronary artery disease   . Hypertension   . Myocardial infarction ([place])   . Obesity   . Pneumonia   . Pulmonary edema   . Shortness of breath dyspnea   . Sleep apnea    USES CPAP  . Wears glasses    Family History  Problem Relation Age of Onset  . Hypertension Mother   . Liver disease Father    Past Surgical History:   Procedure Laterality Date  . AV FISTULA PLACEMENT Right 03/15/2019   Procedure: RIGHT ARM ARTERIOVENOUS (AV) FISTULA CREATION;  Surgeon: Angelia Mould, MD;  Location: [place];  Service: Vascular;  Laterality: Right;  . COLONOSCOPY    . COLONOSCOPY W/ BIOPSIES AND POLYPECTOMY    . CORONARY STENT PLACEMENT  2007  . IMPLANTABLE CARDIOVERTER DEFIBRILLATOR IMPLANT  2015   Social History   Socioeconomic History  . Marital status: Married    Spouse name: Not on file  . Number of children: Not on file  . Years of education: Not on file  . Highest education level: Not on file  Occupational History  . Not on file  Social Needs  . Financial resource strain: Not on file  . Food insecurity    Worry: Not on file    Inability: Not on file  . Transportation needs    Medical: Not on file    Non-medical: Not on file  Tobacco Use  . Smoking status: Never Smoker  . Smokeless tobacco: Never Used  Substance and Sexual Activity  . Alcohol use: No  . Drug use: No  . Sexual activity: Yes  Lifestyle  . Physical activity    Days per week: Not on file    Minutes per session: Not on file  . Stress: Not on file  Relationships  . Social Herbalist on phone: Not on file    Gets together: Not on file    Attends religious service: Not on file    Active member of club or organization: Not on file    Attends meetings of clubs or organizations: Not on file    Relationship status: Not on file  . Intimate partner violence    Fear of current or ex partner: Not on file    Emotionally abused: Not on file    Physically abused: Not on file    Forced sexual activity: Not on file  Other Topics Concern  . Not on file  Social History Narrative   ** Merged History Encounter **         Lipid Panel     Component Value Date/Time   CHOL 182 03/05/2016 0503   TRIG 118 03/05/2016 0503   HDL 38 (L) 03/05/2016 0503   CHOLHDL 4.8 03/05/2016 0503   VLDL 24 03/05/2016 0503   LDLCALC 120 (H)  03/05/2016 0503    Review of Systems: General: negative for chills, fever, night sweats or weight changes.  Cardiovascular: negative for chest pain, dyspnea on exertion, edema, orthopnea, palpitations, paroxysmal nocturnal dyspnea or shortness of breath Dermatological: negative for rash Respiratory: negative for cough or wheezing Urologic: negative for hematuria Abdominal: negative for nausea, vomiting, diarrhea, bright red blood per rectum, melena, or hematemesis Neurologic: negative for visual changes, syncope, or dizziness All other systems reviewed and are otherwise negative except as noted above.   Physical Exam:  There were no vitals taken for this visit.  {Physical UJWJ:[id number]}  EKG *** --  personally reviewed   ASSESSMENT AND PLAN:   No problem-specific Assessment & Plan notes found for this encounter.     Follow-Up ***  Nelida Gores, MHS [facility] 06/26/2019 8:03 AM

## 2019-06-28 ENCOUNTER — Ambulatory Visit: Payer: BC Managed Care – PPO | Attending: Internal Medicine | Admitting: Physician Assistant

## 2019-06-28 ENCOUNTER — Other Ambulatory Visit: Payer: Self-pay

## 2019-06-28 DIAGNOSIS — I10 Essential (primary) hypertension: Secondary | ICD-10-CM | POA: Diagnosis not present

## 2019-06-28 DIAGNOSIS — E785 Hyperlipidemia, unspecified: Secondary | ICD-10-CM

## 2019-06-28 DIAGNOSIS — I5042 Chronic combined systolic (congestive) and diastolic (congestive) heart failure: Secondary | ICD-10-CM | POA: Diagnosis not present

## 2019-06-28 DIAGNOSIS — I2510 Atherosclerotic heart disease of native coronary artery without angina pectoris: Secondary | ICD-10-CM | POA: Diagnosis not present

## 2019-06-28 MED ORDER — ATORVASTATIN CALCIUM 40 MG PO TABS: 40.0000 mg | ORAL_TABLET | Freq: Every day | ORAL | 1 refills | Status: DC

## 2019-06-28 MED ORDER — HYDRALAZINE HCL 10 MG PO TABS: 10.0000 mg | ORAL_TABLET | Freq: Three times a day (TID) | ORAL | 1 refills | Status: DC

## 2019-06-28 MED ORDER — ISOSORBIDE MONONITRATE ER 30 MG PO TB24: 30.0000 mg | ORAL_TABLET | Freq: Every day | ORAL | 1 refills | Status: DC

## 2019-06-28 MED ORDER — TORSEMIDE 20 MG PO TABS: 40.0000 mg | ORAL_TABLET | Freq: Every day | ORAL | 1 refills | Status: DC

## 2019-06-28 MED ORDER — AMLODIPINE BESYLATE 10 MG PO TABS: 10.0000 mg | ORAL_TABLET | Freq: Every day | ORAL | 1 refills | Status: DC

## 2019-06-28 MED ORDER — CARVEDILOL 25 MG PO TABS: 25.0000 mg | ORAL_TABLET | Freq: Two times a day (BID) | ORAL | 1 refills | Status: DC

## 2019-06-28 MED FILL — CARVEDILOL 25 MG TABLET: 25 | 30 days supply | Qty: 60 | Fill #0

## 2019-06-28 MED FILL — AMLODIPINE BESYLATE 10 MG T: 10 | 30 days supply | Qty: 30 | Fill #0

## 2019-06-28 NOTE — Telephone Encounter (Signed)
Spoke with pt about upcoming appt on 07/02/19. Pt was advise to check vitals prior to appt. Pt questions and concerns were address.

## 2019-06-28 NOTE — Progress Notes (Signed)
Virtual Visit via Telephone Note  I connected with Victor Thompson on 06/28/19 at  8:50 AM EDT by telephone and verified that I am speaking with the correct person using two identifiers.   I discussed the limitations, risks, security and privacy concerns of performing an evaluation and management service by telephone and the availability of in person appointments. I also discussed with the patient that there may be a patient responsible charge related to this service. The patient expressed understanding and agreed to proceed.  Patient location:  home My Location:  [place] office Persons on the call:  Me and the patient  History of Present Illness: Patient needs RF on his meds.  Last labs in may 2020.  Denies orthopnea.  No SOB.  No CP.  No dizziness.  Feeling good overall.  Trying to adhere to quarantine esp bc he has so many health problems.      Observations/Objective: A&Ox3   Assessment and Plan: 1. Essential hypertension No changes - amLODipine (NORVASC) 10 MG tablet; Take 1 tablet (10 mg total) by mouth daily.  Dispense: 90 tablet; Refill: 1 - carvedilol (COREG) 25 MG tablet; Take 1 tablet (25 mg total) by mouth 2 (two) times daily with a meal.  Dispense: 180 tablet; Refill: 1 - hydrALAZINE (APRESOLINE) 10 MG tablet; Take 1 tablet (10 mg total) by mouth 3 (three) times daily.  Dispense: 270 tablet; Refill: 1  2. CAD in native artery - atorvastatin (LIPITOR) 40 MG tablet; Take 1 tablet (40 mg total) by mouth at bedtime.  Dispense: 90 tablet; Refill: 1 - isosorbide mononitrate (IMDUR) 30 MG 24 hr tablet; Take 1 tablet (30 mg total) by mouth daily.  Dispense: 90 tablet; Refill: 1  3. Hyperlipidemia, unspecified hyperlipidemia type - atorvastatin (LIPITOR) 40 MG tablet; Take 1 tablet (40 mg total) by mouth at bedtime.  Dispense: 90 tablet; Refill: 1  4. Chronic combined systolic and diastolic CHF (congestive heart failure) (HCC) - carvedilol (COREG) 25 MG tablet; Take 1 tablet (25 mg  total) by mouth 2 (two) times daily with a meal.  Dispense: 180 tablet; Refill: 1 - isosorbide mononitrate (IMDUR) 30 MG 24 hr tablet; Take 1 tablet (30 mg total) by mouth daily.  Dispense: 90 tablet; Refill: 1 - torsemide (DEMADEX) 20 MG tablet; Take 2 tablets (40 mg total) by mouth daily.  Dispense: 180 tablet; Refill: 1   Follow Up Instructions: 2-3 months with PCP;  Sooner if needed   I discussed the assessment and treatment plan with the patient. The patient was provided an opportunity to ask questions and all were answered. The patient agreed with the plan and demonstrated an understanding of the instructions.   The patient was advised to call back or seek an in-person evaluation if the symptoms worsen or if the condition fails to improve as anticipated.  I provided 13 minutes of non-face-to-face time during this encounter.   Freeman Caldron, PA-C  Patient ID: Victor Thompson, male   DOB: 1961-05-02, 58 y.o.   MRN: [id number]

## 2019-07-02 ENCOUNTER — Encounter: Payer: Self-pay | Admitting: Internal Medicine

## 2019-07-02 ENCOUNTER — Telehealth (INDEPENDENT_AMBULATORY_CARE_PROVIDER_SITE_OTHER): Payer: BC Managed Care – PPO | Admitting: Internal Medicine

## 2019-07-02 ENCOUNTER — Telehealth: Payer: Self-pay | Admitting: Internal Medicine

## 2019-07-02 ENCOUNTER — Other Ambulatory Visit: Payer: Self-pay

## 2019-07-02 VITALS — Ht 67.0 in | Wt 292.0 lb

## 2019-07-02 DIAGNOSIS — R0602 Shortness of breath: Secondary | ICD-10-CM | POA: Diagnosis not present

## 2019-07-02 DIAGNOSIS — I472 Ventricular tachycardia, unspecified: Secondary | ICD-10-CM

## 2019-07-02 NOTE — Progress Notes (Signed)
Electrophysiology TeleHealth Note  Due to national recommendations of social distancing due to [place] 19, an audio telehealth visit is felt to be most appropriate for this patient at this time.  Verbal consent was obtained by me for the telehealth visit today.  The patient does not have capability for a virtual visit.  A phone visit is therefore required today.   Date:  07/02/2019   ID:  Victor Thompson, DOB 1961/01/03, MRN [id number]  Location: patient's home  Provider location:  [facility]  Evaluation Performed: Follow-up visit  PCP:  [name], MD   Electrophysiologist:  Dr Rayann Heman  Chief Complaint:  SOB  History of Present Illness:    Victor Thompson is a 58 y.o. male who presents via telehealth conferencing today.  Since last being seen in our clinic, the patient reports doing reasonably well.  He is SOB.  He feels that he is wheezing quite and bit and is progressively SOB.  He feels that his edema is stable.   He thinks that he may have received "a shock" from his defibrillator several weeks ago but did not seek medical care.  Today, he denies symptoms of palpitations, chest pain, dizziness, presyncope, or syncope.  The patient is otherwise without complaint today.    Past Medical History:  Diagnosis Date  . AICD (automatic cardioverter/defibrillator) present    2015  . Asthma    no meds  . Cardiac arrest ([place]) 2015  . CHF (congestive heart failure) ([place])   . Chronic kidney disease    ckd -stage 5  . Coronary artery disease   . Hypertension   . Myocardial infarction ([place])   . Obesity   . Pneumonia   . Pulmonary edema   . Shortness of breath dyspnea   . Sleep apnea    USES CPAP  . Wears glasses     Past Surgical History:  Procedure Laterality Date  . AV FISTULA PLACEMENT Right 03/15/2019   Procedure: RIGHT ARM ARTERIOVENOUS (AV) FISTULA CREATION;  Surgeon: Angelia Mould, MD;  Location: [place] [facility];  Service: Vascular;  Laterality: Right;  . COLONOSCOPY     . COLONOSCOPY W/ BIOPSIES AND POLYPECTOMY    . CORONARY STENT PLACEMENT  2007  . IMPLANTABLE CARDIOVERTER DEFIBRILLATOR IMPLANT  2015    Current Outpatient Medications  Medication Sig Dispense Refill  . albuterol (PROVENTIL HFA;VENTOLIN HFA) 108 (90 Base) MCG/ACT inhaler Inhale 2 puffs into the lungs every 4 (four) hours as needed for wheezing or shortness of breath. 54 g 3  . amLODipine (NORVASC) 10 MG tablet Take 1 tablet (10 mg total) by mouth daily. 90 tablet 1  . aspirin EC 81 MG tablet Take 1 tablet (81 mg total) by mouth daily. 30 tablet 5  . atorvastatin (LIPITOR) 40 MG tablet Take 1 tablet (40 mg total) by mouth at bedtime. 90 tablet 1  . calcitRIOL (ROCALTROL) 0.5 MCG capsule Take 0.5 mcg by mouth daily.   11  . carvedilol (COREG) 25 MG tablet Take 1 tablet (25 mg total) by mouth 2 (two) times daily with a meal. 180 tablet 1  . Cholecalciferol (VITAMIN D-3) 125 MCG (5000 UT) TABS Take 5,000 Units by mouth daily.     . cyclobenzaprine (FLEXERIL) 10 MG tablet TAKE 1 TABLET (10 MG TOTAL) BY MOUTH AT BEDTIME. AS NEEDED FOR PAIN (Patient taking differently: Take 10 mg by mouth at bedtime as needed for muscle spasms. ) 30 tablet 0  . EPINEPHrine 0.3 mg/0.3 mL IJ  SOAJ injection Inject 0.3 mLs into the muscle once as needed for anaphylaxis.     . fluticasone (FLONASE) 50 MCG/ACT nasal spray Place 1 spray into both nostrils daily. (Patient taking differently: Place 1 spray into both nostrils daily as needed for allergies. ) 16 g 2  . hydrALAZINE (APRESOLINE) 10 MG tablet Take 1 tablet (10 mg total) by mouth 3 (three) times daily. 270 tablet 1  . isosorbide mononitrate (IMDUR) 30 MG 24 hr tablet Take 1 tablet (30 mg total) by mouth daily. 90 tablet 1  . loratadine (CLARITIN) 10 MG tablet Take 10 mg by mouth daily as needed for allergies.    Marland Kitchen PRESCRIPTION MEDICATION Inhale into the lungs at bedtime. CPAP    . torsemide (DEMADEX) 20 MG tablet Take 2 tablets (40 mg total) by mouth daily. 180  tablet 1   No current facility-administered medications for this visit.     Allergies:   Ace inhibitors, Influenza vaccines, Ketorolac, Lidocaine, Lisinopril, and Penicillins   Social History:  The patient  reports that he has never smoked. He has never used smokeless tobacco. He reports that he does not drink alcohol or use drugs.   Family History:  The patient's  family history includes Hypertension in his mother; Liver disease in his father.   ROS:  Please see the history of present illness.   All other systems are personally reviewed and negative.    Exam:    Vital Signs:  Ht 5\' 7"  (1.702 m)   Wt 292 lb (132.5 kg)   BMI 45.73 kg/m   Well sounding today   Labs/Other Tests and Data Reviewed:    Recent Labs: 07/06/2018: Magnesium 2.2 04/26/2019: B Natriuretic Peptide 427.8; BUN 66; Creatinine, Ser 6.91; Hemoglobin 11.1; Platelets 170; Potassium 4.1; Sodium 142   Wt Readings from Last 3 Encounters:  07/02/19 292 lb (132.5 kg)  04/26/19 291 lb 14.2 oz (132.4 kg)  04/25/19 292 lb (132.5 kg)    wxho 06/07/2019- reviewed  ASSESSMENT & PLAN:    1.  SOB Likely multifactoral  He has a h/o asthma and feels that the weather may have exacerbated his asthma.  I have advised that he follow-up with primary care.  He may benefit from pulmonary referral.  2. ICD shock Device interrogation is necessary at this time.  We will arrange for him to come to our device clinic to arrange this at the next available time. No driving until ICD interrogation.  3. Ischemic CM/ chronic systolic dysfunction/ prior VF arrest As above Will need close follow-up with general cardiology   Follow-up:  Bring back in the next week for EP APP to interrogate his S-ICD.  Very complicated patient.  He is at risk for further decompensation/ hospitalization/ arrhythmias.  A high level of decision making was required for this encounter.  Patient Risk:  after full review of this patients clinical status, I feel  that they are at high risk at this time.  Today, I have spent 20 minutes with the patient with telehealth technology discussing arrhythmia management .    Army Fossa, MD  07/02/2019 8:47 AM     [address] [place] [address] [phone] (office) [phone] (fax)

## 2019-07-02 NOTE — Telephone Encounter (Signed)
Pt has virtual appt with [facility] today 07-02-19, wants pt to have EP APP fu next week. [name] is the APP in clinic next week. Offered 8-6 or 8-7 am or pm.

## 2019-07-03 ENCOUNTER — Emergency Department (HOSPITAL_COMMUNITY)
Admission: EM | Admit: 2019-07-03 | Discharge: 2019-07-03 | Disposition: A | Discharge status: Home / Self Care | Payer: BC Managed Care – PPO | Attending: Emergency Medicine | Admitting: Emergency Medicine

## 2019-07-03 ENCOUNTER — Encounter (HOSPITAL_COMMUNITY): Payer: Self-pay | Admitting: Emergency Medicine

## 2019-07-03 ENCOUNTER — Other Ambulatory Visit: Payer: Self-pay

## 2019-07-03 ENCOUNTER — Emergency Department (HOSPITAL_COMMUNITY): Payer: BC Managed Care – PPO

## 2019-07-03 DIAGNOSIS — Z7982 Long term (current) use of aspirin: Secondary | ICD-10-CM | POA: Diagnosis not present

## 2019-07-03 DIAGNOSIS — Z79899 Other long term (current) drug therapy: Secondary | ICD-10-CM | POA: Diagnosis not present

## 2019-07-03 DIAGNOSIS — N186 End stage renal disease: Secondary | ICD-10-CM | POA: Diagnosis not present

## 2019-07-03 DIAGNOSIS — I509 Heart failure, unspecified: Secondary | ICD-10-CM

## 2019-07-03 DIAGNOSIS — J441 Chronic obstructive pulmonary disease with (acute) exacerbation: Secondary | ICD-10-CM

## 2019-07-03 DIAGNOSIS — Z992 Dependence on renal dialysis: Secondary | ICD-10-CM | POA: Insufficient documentation

## 2019-07-03 DIAGNOSIS — I132 Hypertensive heart and chronic kidney disease with heart failure and with stage 5 chronic kidney disease, or end stage renal disease: Secondary | ICD-10-CM | POA: Insufficient documentation

## 2019-07-03 DIAGNOSIS — J45909 Unspecified asthma, uncomplicated: Secondary | ICD-10-CM | POA: Insufficient documentation

## 2019-07-03 DIAGNOSIS — I2510 Atherosclerotic heart disease of native coronary artery without angina pectoris: Secondary | ICD-10-CM | POA: Insufficient documentation

## 2019-07-03 DIAGNOSIS — R0602 Shortness of breath: Secondary | ICD-10-CM | POA: Diagnosis present

## 2019-07-03 LAB — BASIC METABOLIC PANEL
Anion gap: 14 (ref 5–15)
BUN: 47 mg/dL — ABNORMAL HIGH (ref 6–20)
CO2: 17 mmol/L — ABNORMAL LOW (ref 22–32)
Calcium: 8.1 mg/dL — ABNORMAL LOW (ref 8.9–10.3)
Chloride: 112 mmol/L — ABNORMAL HIGH (ref 98–111)
Creatinine, Ser: 7.09 mg/dL — ABNORMAL HIGH (ref 0.61–1.24)
GFR calc Af Amer: 9 mL/min — ABNORMAL LOW (ref >60)
GFR calc non Af Amer: 8 mL/min — ABNORMAL LOW (ref >60)
Glucose, Bld: 112 mg/dL — ABNORMAL HIGH (ref 70–99)
Potassium: 3.7 mmol/L (ref 3.5–5.1)
Sodium: 143 mmol/L (ref 135–145)

## 2019-07-03 LAB — TROPONIN I (HIGH SENSITIVITY)
Troponin I (High Sensitivity): 42 ng/L — ABNORMAL HIGH (ref <18)
Troponin I (High Sensitivity): 44 ng/L — ABNORMAL HIGH (ref <18)

## 2019-07-03 LAB — CBC
HCT: 34.5 % — ABNORMAL LOW (ref 39.0–52.0)
Hemoglobin: 11 g/dL — ABNORMAL LOW (ref 13.0–17.0)
MCH: 29.6 pg (ref 26.0–34.0)
MCHC: 31.9 g/dL (ref 30.0–36.0)
MCV: 92.7 fL (ref 80.0–100.0)
Platelets: 182 10*3/uL (ref 150–400)
RBC: 3.72 MIL/uL — ABNORMAL LOW (ref 4.22–5.81)
RDW: 15 % (ref 11.5–15.5)
WBC: 6.1 10*3/uL (ref 4.0–10.5)
nRBC: 0 % (ref 0.0–0.2)

## 2019-07-03 LAB — BRAIN NATRIURETIC PEPTIDE: B Natriuretic Peptide: 713.4 pg/mL — ABNORMAL HIGH (ref 0.0–100.0)

## 2019-07-03 MED ORDER — ALBUTEROL SULFATE HFA 108 (90 BASE) MCG/ACT IN AERS
6.0000 | INHALATION_SPRAY | Freq: Once | RESPIRATORY_TRACT | Status: AC
  Administered 2019-07-03: 6 via RESPIRATORY_TRACT
  Filled 2019-07-03: qty 6.7

## 2019-07-03 MED ORDER — SODIUM CHLORIDE 0.9% FLUSH
3.0000 mL | Freq: Once | INTRAVENOUS | Status: AC
  Administered 2019-07-03: 3 mL via INTRAVENOUS

## 2019-07-03 MED ORDER — METHYLPREDNISOLONE SODIUM SUCC 125 MG IJ SOLR
125.0000 mg | Freq: Once | INTRAMUSCULAR | Status: AC
  Administered 2019-07-03: 125 mg via INTRAVENOUS
  Filled 2019-07-03: qty 2

## 2019-07-03 MED ORDER — FUROSEMIDE 10 MG/ML IJ SOLN
40.0000 mg | Freq: Once | INTRAMUSCULAR | Status: AC
  Administered 2019-07-03: 40 mg via INTRAVENOUS
  Filled 2019-07-03: qty 4

## 2019-07-03 MED ORDER — PREDNISONE 10 MG PO TABS: 20.0000 mg | ORAL_TABLET | Freq: Two times a day (BID) | ORAL | 0 refills | Status: DC

## 2019-07-03 NOTE — Discharge Instructions (Addendum)
Begin taking prednisone 20 mg twice daily as prescribed today.  Take an additional 20 mg of torsemide in the afternoon for the next 3 days.  Continue use of your inhaler as needed.  Follow-up with your primary doctor in the next 1 to 2 weeks, and return to the ER if you develop worsening breathing, high fever, severe chest pain, or other new and concerning symptoms.

## 2019-07-03 NOTE — ED Notes (Signed)
Patient verbalizes understanding of discharge instructions. Opportunity for questioning and answers were provided. Armband removed by staff, pt discharged from ED.  

## 2019-07-03 NOTE — ED Provider Notes (Addendum)
[facility] EMERGENCY DEPARTMENT Provider Note   CSN: [id number] Arrival date & time: 07/03/19  5631     History   Chief Complaint Chief Complaint  Patient presents with  . Shortness of Breath    HPI Victor Thompson is a 58 y.o. male.     Patient is a 58 year old male with extensive past medical history including congestive heart failure with defibrillator, coronary artery disease, hypertension, asthma, and end-stage renal disease approaching dialysis.  Patient presents for evaluation of shortness of breath.  This is progressed over the past week.  He states that he becomes dyspneic with walking short distances and with lying flat.  He denies any fevers, chills, or chest pain.  He denies any productive cough.  He tells me he was tested for COVID last week and this test was negative.  The history is provided by the patient.  Shortness of Breath Severity:  Moderate Onset quality:  Gradual Duration:  1 week Timing:  Constant Progression:  Worsening Chronicity:  New Context: activity   Relieved by:  Nothing Worsened by:  Nothing Ineffective treatments:  None tried Associated symptoms: no chest pain, no cough, no fever and no sputum production     Past Medical History:  Diagnosis Date  . AICD (automatic cardioverter/defibrillator) present    2015  . Asthma    no meds  . Cardiac arrest ([place]) 2015  . CHF (congestive heart failure) ([place])   . Chronic kidney disease    ckd -stage 5  . Coronary artery disease   . Hypertension   . Myocardial infarction ([place])   . Obesity   . Pneumonia   . Pulmonary edema   . Shortness of breath dyspnea   . Sleep apnea    USES CPAP  . Wears glasses     Patient Active Problem List   Diagnosis Date Noted  . CAD (coronary artery disease), native coronary artery 04/10/2019  . CKD (chronic kidney disease) stage 5, GFR less than 15 ml/min (HCC) 09/26/2018  . Chronic midline low back pain without sciatica 02/17/2018  . COPD  exacerbation ([place]) 03/04/2016  . Cardiac arrest with ventricular fibrillation ([place]) 03/04/2016  . Defibrillator discharge   . Chronic combined systolic and diastolic CHF (congestive heart failure) ([place]) 01/22/2016  . Erectile dysfunction 01/22/2016  . Allergic rhinitis 01/22/2016  . GERD (gastroesophageal reflux disease) 01/22/2016  . PROTEINURIA 02/17/2010  . Cardiomyopathy, ischemic 02/16/2010  . Obstructive sleep apnea 02/16/2010  . EXTERNAL HEMORRHOIDS 09/26/2009  . Tobacco use disorder in remission 06/03/2009  . Essential hypertension 10/01/2008  . Hyperlipidemia 07/14/2007  . OBESITY 07/14/2007  . Asthma 07/14/2007    Past Surgical History:  Procedure Laterality Date  . AV FISTULA PLACEMENT Right 03/15/2019   Procedure: RIGHT ARM ARTERIOVENOUS (AV) FISTULA CREATION;  Surgeon: Angelia Mould, MD;  Location: [facility];  Service: Vascular;  Laterality: Right;  . COLONOSCOPY    . COLONOSCOPY W/ BIOPSIES AND POLYPECTOMY    . CORONARY STENT PLACEMENT  2007  . IMPLANTABLE CARDIOVERTER DEFIBRILLATOR IMPLANT  2015        Home Medications    Prior to Admission medications   Medication Sig Start Date End Date Taking? Authorizing Provider  albuterol (PROVENTIL HFA;VENTOLIN HFA) 108 (90 Base) MCG/ACT inhaler Inhale 2 puffs into the lungs every 4 (four) hours as needed for wheezing or shortness of breath. 02/06/16   Charlott Rakes, MD  amLODipine (NORVASC) 10 MG tablet Take 1 tablet (10 mg total) by mouth daily. 06/28/19  [name], PA-C  aspirin EC 81 MG tablet Take 1 tablet (81 mg total) by mouth daily. 01/22/16   [name], [name], MD  atorvastatin (LIPITOR) 40 MG tablet Take 1 tablet (40 mg total) by mouth at bedtime. 06/28/19   [name], PA-C  calcitRIOL (ROCALTROL) 0.5 MCG capsule Take 0.5 mcg by mouth daily.  08/16/18   [provider]  carvedilol (COREG) 25 MG tablet Take 1 tablet (25 mg total) by mouth 2 (two) times daily with a meal. 06/28/19   McClung,  Dionne Bucy, PA-C  Cholecalciferol (VITAMIN D-3) 125 MCG (5000 UT) TABS Take 5,000 Units by mouth daily.     [provider]  cyclobenzaprine (FLEXERIL) 10 MG tablet TAKE 1 TABLET (10 MG TOTAL) BY MOUTH AT BEDTIME. AS NEEDED FOR PAIN Patient taking differently: Take 10 mg by mouth at bedtime as needed for muscle spasms.  12/21/18   [name], MD  EPINEPHrine 0.3 mg/0.3 mL IJ SOAJ injection Inject 0.3 mLs into the muscle once as needed for anaphylaxis.  02/06/16   [provider]  fluticasone (FLONASE) 50 MCG/ACT nasal spray Place 1 spray into both nostrils daily. Patient taking differently: Place 1 spray into both nostrils daily as needed for allergies.  12/31/18   Volanda Napoleon, PA-C  hydrALAZINE (APRESOLINE) 10 MG tablet Take 1 tablet (10 mg total) by mouth 3 (three) times daily. 06/28/19 06/27/20  [name], PA-C  isosorbide mononitrate (IMDUR) 30 MG 24 hr tablet Take 1 tablet (30 mg total) by mouth daily. 06/28/19 06/27/20  [name], PA-C  loratadine (CLARITIN) 10 MG tablet Take 10 mg by mouth daily as needed for allergies.    [provider]  PRESCRIPTION MEDICATION Inhale into the lungs at bedtime. CPAP    [provider]  torsemide (DEMADEX) 20 MG tablet Take 2 tablets (40 mg total) by mouth daily. 06/28/19   [name], PA-C    Family History Family History  Problem Relation Age of Onset  . Hypertension Mother   . Liver disease Father     Social History Social History   Tobacco Use  . Smoking status: Never Smoker  . Smokeless tobacco: Never Used  Substance Use Topics  . Alcohol use: No  . Drug use: No     Allergies   Ace inhibitors, Influenza vaccines, Ketorolac, Lidocaine, Lisinopril, and Penicillins   Review of Systems Review of Systems  Constitutional: Negative for fever.  Respiratory: Positive for shortness of breath. Negative for cough and sputum production.   Cardiovascular: Negative for chest pain.   All other systems reviewed and are negative.    Physical Exam Updated Vital Signs BP (!) 151/77   Pulse 76   Temp 98.1 F (36.7 C)   Resp (!) 22   SpO2 97%   Physical Exam Vitals signs and nursing note reviewed.  Constitutional:      General: He is not in acute distress.    Appearance: He is well-developed. He is not diaphoretic.  HENT:     Head: Normocephalic and atraumatic.  Neck:     Musculoskeletal: Normal range of motion and neck supple.  Cardiovascular:     Rate and Rhythm: Normal rate and regular rhythm.     Heart sounds: No murmur. No friction rub.  Pulmonary:     Effort: Pulmonary effort is normal. No respiratory distress.     Breath sounds: Examination of the right-middle field reveals wheezing. Examination of the left-middle field reveals  wheezing. Wheezing present. No rales.  Abdominal:     General: Bowel sounds are normal. There is no distension.     Palpations: Abdomen is soft.     Tenderness: There is no abdominal tenderness.  Musculoskeletal: Normal range of motion.     Right lower leg: Edema present.     Left lower leg: Edema present.     Comments: There is 1+ edema of both lower extremities.  Skin:    General: Skin is warm and dry.  Neurological:     Mental Status: He is alert and oriented to person, place, and time.     Coordination: Coordination normal.      ED Treatments / Results  Labs (all labs ordered are listed, but only abnormal results are displayed) Labs Reviewed  BASIC METABOLIC PANEL - Abnormal; Notable for the following components:      Result Value   Chloride 112 (*)    CO2 17 (*)    Glucose, Bld 112 (*)    BUN 47 (*)    Creatinine, Ser 7.09 (*)    Calcium 8.1 (*)    GFR calc non Af Amer 8 (*)    GFR calc Af Amer 9 (*)    All other components within normal limits  CBC - Abnormal; Notable for the following components:   RBC 3.72 (*)    Hemoglobin 11.0 (*)    HCT 34.5 (*)    All other components within normal limits   TROPONIN I (HIGH SENSITIVITY) - Abnormal; Notable for the following components:   Troponin I (High Sensitivity) 42 (*)    All other components within normal limits  BRAIN NATRIURETIC PEPTIDE    EKG EKG Interpretation  Date/Time:  Tuesday July 03 2019 06:47:40 EDT Ventricular Rate:  78 PR Interval:  184 QRS Duration: 144 QT Interval:  448 QTC Calculation: 510 R Axis:   -66 Text Interpretation:  Normal sinus rhythm Left axis deviation Left bundle branch block Abnormal ECG Confirmed by Veryl Speak [id number]) on 07/03/2019 7:51:48 AM   Radiology No results found.  Procedures Procedures (including critical care time)  Medications Ordered in ED Medications  sodium chloride flush (NS) 0.9 % injection 3 mL (has no administration in time range)  albuterol (VENTOLIN HFA) 108 (90 Base) MCG/ACT inhaler 6 puff (has no administration in time range)  methylPREDNISolone sodium succinate (SOLU-MEDROL) 125 mg/2 mL injection 125 mg (has no administration in time range)  furosemide (LASIX) injection 40 mg (has no administration in time range)     Initial Impression / Assessment and Plan / ED Course  I have reviewed the triage vital signs and the nursing notes.  Pertinent labs & imaging results that were available during my care of the patient were reviewed by me and considered in my medical decision making (see chart for details).  Patient is a 58 year old male with past medical history as described in the HPI.  He presents today with complaints of difficulty breathing.  The etiology of this appears to be a combination of COPD and CHF.  His BNP is elevated and chest x-ray is suggestive of superimposed interstitial edema.  Patient was given IV Lasix with a good diuresis.  Patient also has some wheezing on exam.  He was given Solu-Medrol and albuterol by MDI and is feeling improved.  Remainder of his work-up is otherwise unremarkable.  Creatinine is consistent with baseline.  His troponin is  mildly elevated, however repeat is unchanged and patient has baseline renal insufficiency that  I suspect is the etiology of the troponin elevation as opposed to an acute cardiac event.  Patient's oxygen saturations are currently 98% to 100% while I am speaking with him.  He is feeling better and I believe is appropriate for discharge.  He will be advised to take an additional dose of his torsemide in the afternoon.  He will also be prescribed prednisone and advised to use his inhaler as needed.  Final Clinical Impressions(s) / ED Diagnoses   Final diagnoses:  None    ED Discharge Orders    None       Veryl Speak, MD 07/03/19 1004    Veryl Speak, MD 07/03/19 1007

## 2019-07-03 NOTE — ED Triage Notes (Signed)
Patient with shortness of breath since Sunday.  Patient denies any chest pain with the shortness of breath.  Patient has CHF, has fistula in right arm, has not started dialysis.

## 2019-07-12 NOTE — Progress Notes (Signed)
Cardiology Office Note Date:  07/13/2019  Patient ID:  Victor Thompson, DOB 09-Jul-1961, MRN [id number] PCP:  [name], MD  Cardiologist:  Dr. Doylene Canard  >>> pt preference, now Dr Radford Pax Electrophysiologist: Dr. Rayann Heman Nephrologist: Dr. Joelyn Oms    Chief Complaint:  f/u Dr. Jackalyn Lombard visit  History of Present Illness: Victor Thompson is a 58 y.o. male with history of CAD (PCI 2007), ICM w/hx of arrest and ICD (at [facility] in 2015), HTN, HLD, CKD, approaching dialysis), had AVF creation April 2020, chronic CHF (systolic), OSA w/CPAP, morbid obesity, he has h/o seeing/following with pulmonary with episodes of angioedema of unknown trigger that requires epi pen use  He saw Dr. Rayann Heman via a telehealth visit 07/02/2019, he was progressively SOB, reported wheezing, but reported his edema as stable.  He thought he may have been shocked by his device a couple weeks prior though did not seek attention for it, planned for an in-clinic visit to evaluate his device  He went to the ER 7/28 for his SOB felt to be combination of CHF/COPD, given lasix IV with good diuresis and solumedrol, albuterol.  Labs reported to be c/w historical, mild Trop elevation 2/2 renal, CHF, not ACS.  Pt was feeling better, VSS, sats good and discharged from the ER with instruction to take an additional torsemide once home, and rx prednisone  Labs from his ER visit are reviewed, HS Trop 42, 44, agree are c/w his Trop historically, K+ 3.7, no mag, WBC wnl, H/H stable, Creat looked about his baseline at 7.09  He feels well, breathing is back at his baseline.  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  Last month with his shock he seems to recall he was relaxed watching TV, no warning, felt fine before and after.  He has not had any dizzy spells, no near syncope or syncope, with the shock or otherwise. He has not had further shocks   He has been  using an extra Torsemide a few days a week, sometime QOD to keep his swelling controlled. This a week at the recommendation of the ER he says and this seems to have been working.   COVID education/precautions were discussed with the patient today    Device information: BSCi S-ICD, implanted 2015 at [facility] + h/o VT, VF and appropriate shocks AAD  None  2017 with shocks for VF, was observed that this occurred after switch from coreg to toprol.   Neg stress test with this event He was placed back on coreg with thoughts if more arrhythmia he may require amiodarone in the future  Past Medical History:  Diagnosis Date  . AICD (automatic cardioverter/defibrillator) present    2015  . Asthma    no meds  . Cardiac arrest ([place]) 2015  . CHF (congestive heart failure) ([place])   . Chronic kidney disease    ckd -stage 5  . Coronary artery disease   . Hypertension   . Myocardial infarction ([place])   . Obesity   . Pneumonia   . Pulmonary edema   . Shortness of breath dyspnea   . Sleep apnea    USES CPAP  . Wears glasses     Past Surgical History:  Procedure Laterality Date  . AV FISTULA PLACEMENT Right 03/15/2019   Procedure: RIGHT ARM ARTERIOVENOUS (AV) FISTULA CREATION;  Surgeon: Angelia Mould, MD;  Location: [facility];  Service: Vascular;  Laterality: Right;  . COLONOSCOPY    . COLONOSCOPY W/ BIOPSIES AND POLYPECTOMY    . CORONARY STENT PLACEMENT  2007  . IMPLANTABLE CARDIOVERTER DEFIBRILLATOR IMPLANT  2015    Current Outpatient Medications  Medication Sig Dispense Refill  . albuterol (PROVENTIL HFA;VENTOLIN HFA) 108 (90 Base) MCG/ACT inhaler Inhale 2 puffs into the lungs every 4 (four) hours as needed for wheezing or shortness of breath. 54 g 3  . amLODipine (NORVASC) 10 MG tablet Take 1 tablet (10 mg total) by mouth daily. 90 tablet 1  . aspirin EC 81 MG tablet Take 1 tablet (81 mg total) by mouth daily. 30 tablet 5  . atorvastatin (LIPITOR) 40 MG tablet Take 1  tablet (40 mg total) by mouth at bedtime. 90 tablet 1  . calcitRIOL (ROCALTROL) 0.5 MCG capsule Take 0.5 mcg by mouth daily.   11  . carvedilol (COREG) 25 MG tablet Take 1 tablet (25 mg total) by mouth 2 (two) times daily with a meal. 180 tablet 1  . Cholecalciferol (VITAMIN D-3) 125 MCG (5000 UT) TABS Take 5,000 Units by mouth daily.     . cyclobenzaprine (FLEXERIL) 10 MG tablet TAKE 1 TABLET (10 MG TOTAL) BY MOUTH AT BEDTIME. AS NEEDED FOR PAIN (Patient taking differently: Take 10 mg by mouth at bedtime as needed for muscle spasms. ) 30 tablet 0  . EPINEPHrine 0.3 mg/0.3 mL IJ SOAJ injection Inject 0.3 mLs into the muscle once as needed for anaphylaxis.     . fluticasone (FLONASE) 50 MCG/ACT nasal spray Place 1 spray into both nostrils daily. (Patient taking differently: Place 1 spray into both nostrils daily as needed for allergies. ) 16 g 2  . hydrALAZINE (APRESOLINE) 10 MG tablet Take 1 tablet (10 mg total) by mouth 3 (three) times daily. 270 tablet 1  . isosorbide mononitrate (IMDUR) 30 MG 24 hr tablet Take 1 tablet (30 mg total) by mouth daily. 90 tablet 1  . loratadine (CLARITIN) 10 MG tablet Take 10 mg by mouth daily as needed for allergies.    Marland Kitchen PRESCRIPTION MEDICATION Inhale into the lungs at bedtime. CPAP    . torsemide (DEMADEX) 20 MG tablet Take 2 tablets (40 mg total) by mouth daily. 180 tablet 1   No current facility-administered medications for this visit.     Allergies:   Ace inhibitors, Influenza vaccines, Ketorolac, Lidocaine, Lisinopril, and Penicillins   Social History:  The patient  reports that he has never smoked. He has never used smokeless tobacco. He reports that he does not drink alcohol or use drugs.   Family History:  The patient's family history includes Hypertension in his mother; Liver disease in his father.  ROS:  Please see the history of present illness.   All other systems are reviewed and otherwise negative.   PHYSICAL EXAM:  VS:  BP 139/89   Pulse 75    Ht 5\' 7"  (1.702 m)   Wt 295 lb (133.8 kg)   BMI 46.20 kg/m  BMI: Body mass index is 46.2 kg/m. Well nourished, well developed, in no acute distress  HEENT: normocephalic, atraumatic  Neck: no JVD, carotid bruits or masses Cardiac: RRR; no significant murmurs, no rubs, or gallops Lungs:  CTA b/l, no wheezing, rhonchi or rales  Abd: soft, nontender, obese MS: no deformity or atrophy Ext: trace-1+ edema  Skin: warm and dry, no rash Neuro:  No gross deficits appreciated Psych: euthymic mood, full affect  S-CD site is stable, no tethering or discomfort  EKG:  Done  07/04/2019 is reviewed: SR, LBBB, LAD, no significant change from prior is appreciated  ICD interrogation done today and reviewed by myself: battery 18% to ERI, in d/w [facility] representative, estimates a year of battery life 04/29/2019 MMVT episode appropriately/successfuly shocked, none since   06/07/2019: TTE IMPRESSIONS  1. The left ventricle has severely reduced systolic function, with an ejection fraction of 25-30%. The cavity size was severely dilated. There is mildly increased left ventricular wall thickness. Left ventricular diastolic Doppler parameters are  consistent with pseudonormalization. Elevated mean left atrial pressure Left ventricular diffuse hypokinesis.  2. The right ventricle has normal systolic function. The cavity was normal.  3. The mitral valve is grossly normal. Mild thickening of the mitral valve leaflet.  4. The aortic valve is tricuspid. Mild thickening of the aortic valve. No stenosis of the aortic valve.  5. Severe LV dysfunction; mild LVH; severe LVE; moderate diastolic dysfunction; mild MR.  FINDINGS  Left Ventricle: The left ventricle has severely reduced systolic function, with an ejection fraction of 25-30%. The cavity size was severely dilated. There is mildly increased left ventricular wall thickness. Left ventricular diastolic Doppler  parameters are consistent with  pseudonormalization. Elevated mean left atrial pressure Left ventricular diffuse hypokinesis.  Right Ventricle: The right ventricle has normal systolic function. The cavity was normal.  Left Atrium: Left atrial size was normal in size.  Right Atrium: Right atrial size was normal in size.   Pericardium: There is no evidence of pericardial effusion.  Mitral Valve: The mitral valve is grossly normal. Mild thickening of the mitral valve leaflet. Mitral valve regurgitation is mild by color flow Doppler.  Tricuspid Valve: The tricuspid valve is normal in structure. Tricuspid valve regurgitation is mild by color flow Doppler.  Aortic Valve: The aortic valve is tricuspid Mild thickening of the aortic valve. Aortic valve regurgitation was not visualized by color flow Doppler. There is No stenosis of the aortic valve.  Pulmonic Valve: The pulmonic valve was normal in structure. Pulmonic valve regurgitation is not visualized by color flow Doppler.  Venous: The inferior vena cava is normal in size with greater than 50% respiratory variability.  Additional Comments: Severe LV dysfunction; mild LVH; severe LVE; moderate diastolic dysfunction; mild MR.     03/06/2016: Stress myoview IMPRESSION: 1. Large zone of non reversible decreased myocardial perfusion involving the inferolateral 1 post oral lateral walls of LEFT ventricle, extending to the inferior apex. Diffuse myocardial thinning and LEFT ventricular dilatation.  2. Dilated LEFT ventricle with global hypokinesia.  3. Left ventricular ejection fraction 28%  4. High-risk stress test findings based on presence of a large fixed defect with LEFT ventricular dilatation*.     Recent Labs: 07/03/2019: B Natriuretic Peptide 713.4; BUN 47; Creatinine, Ser 7.09; Hemoglobin 11.0; Platelets 182; Potassium 3.7; Sodium 143  No results found for requested labs within last 8760 hours.   Estimated Creatinine Clearance: 15.5 mL/min (A)  (by C-G formula based on SCr of 7.09 mg/dL (H)).   Wt Readings from Last 3 Encounters:  07/13/19 295 lb (133.8 kg)  07/02/19 292 lb (132.5 kg)  04/26/19 291 lb 14.2 oz (132.4 kg)     Other studies reviewed: Additional studies/records reviewed today include: summarized above  ASSESSMENT AND PLAN:  1. H/o cardiac arrest 2. ICD shock     This occurred back in May  He has had shock before... discussed given has been years in-between to make no changes.  He initially is not very excited about  this.  I think his only real AAD option is amiodarone, I discussed this, potential side effects he does not want to start this just yet. He reports good medicine compliance, going back to day in may is difficult but says he does not typically miss his medicines. He works very hard at his job, no anginal sounding symptoms  For now, will make no changes  I discussed [place] law, no driving 6 months from may 224, 2020  3. ICM 4. Chronic CHF (systolic)     On BB, diuretic tx, no ACE/ARB given severe renal disease     He reports his breathing and exertional capacity at his baseline     Lungs are clear, edema today is his usual  Counseled on minimizing sodium He thinks his next appt with Dr. Joelyn Oms is in Oct, though he reports usually sees monthly He is asked to move up his appointment with Dr. Joelyn Oms to help with his reports of increased diuretic use to keep his edema managed BMET today     5. 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 Radford Pax     Disposition: F/u with Device clinic in 60mo follow battery, c/w Dr. Radford Pax as scheduled.  We can see him sooner if needed.  He is instructed to let us know when/if  He ever gets shocked again   Current  medicines are reviewed at length with the patient today.  The patient did not have any concerns regarding medicines.  Venetia Night, PA-C 07/13/2019 8:47 AM     [address] [address] [address] [phone] (office)  [phone] (fax)

## 2019-07-13 ENCOUNTER — Ambulatory Visit (INDEPENDENT_AMBULATORY_CARE_PROVIDER_SITE_OTHER): Payer: BC Managed Care – PPO | Admitting: Physician Assistant

## 2019-07-13 ENCOUNTER — Other Ambulatory Visit: Payer: Self-pay

## 2019-07-13 ENCOUNTER — Other Ambulatory Visit: Payer: Self-pay | Admitting: *Deleted

## 2019-07-13 ENCOUNTER — Encounter: Payer: Self-pay | Admitting: Physician Assistant

## 2019-07-13 VITALS — BP 139/89 | HR 75 | Ht 67.0 in | Wt 295.0 lb

## 2019-07-13 DIAGNOSIS — I2510 Atherosclerotic heart disease of native coronary artery without angina pectoris: Secondary | ICD-10-CM

## 2019-07-13 DIAGNOSIS — I255 Ischemic cardiomyopathy: Secondary | ICD-10-CM

## 2019-07-13 DIAGNOSIS — I5042 Chronic combined systolic (congestive) and diastolic (congestive) heart failure: Secondary | ICD-10-CM

## 2019-07-13 DIAGNOSIS — I472 Ventricular tachycardia, unspecified: Secondary | ICD-10-CM

## 2019-07-13 DIAGNOSIS — Z95810 Presence of automatic (implantable) cardiac defibrillator: Secondary | ICD-10-CM

## 2019-07-13 LAB — BASIC METABOLIC PANEL
BUN/Creatinine Ratio: 12 (ref 9–20)
BUN: 77 mg/dL (ref 6–24)
CO2: 19 mmol/L — ABNORMAL LOW (ref 20–29)
Calcium: 8.1 mg/dL — ABNORMAL LOW (ref 8.7–10.2)
Chloride: 108 mmol/L — ABNORMAL HIGH (ref 96–106)
Creatinine, Ser: 6.61 mg/dL — ABNORMAL HIGH (ref 0.76–1.27)
GFR calc Af Amer: 10 mL/min/{1.73_m2} — ABNORMAL LOW (ref >59)
GFR calc non Af Amer: 9 mL/min/{1.73_m2} — ABNORMAL LOW (ref >59)
Glucose: 106 mg/dL — ABNORMAL HIGH (ref 65–99)
Potassium: 4.7 mmol/L (ref 3.5–5.2)
Sodium: 146 mmol/L — ABNORMAL HIGH (ref 134–144)

## 2019-07-13 LAB — MAGNESIUM: Magnesium: 2.2 mg/dL (ref 1.6–2.3)

## 2019-07-13 NOTE — Patient Instructions (Signed)
Medication Instructions: NONE ORDERED  TODAY  If you need a refill on your cardiac medications before your next appointment, please call your pharmacy.   Lab work:  MAG LEVEL   If you have labs (blood work) drawn today and your tests are completely normal, you will receive your results only by: Marland Kitchen MyChart Message (if you have MyChart) OR . A paper copy in the mail If you have any lab test that is abnormal or we need to change your treatment, we will call you to review the results.  Testing/Procedures: NONE ORDERED  TODAY  Follow-Up:  WITH DEVICE CLINIC IN 3 MONTHS   Any Other Special Instructions Will Be Listed Below (If Applicable).

## 2019-07-15 ENCOUNTER — Encounter: Payer: Self-pay | Admitting: Internal Medicine

## 2019-07-15 DIAGNOSIS — N2581 Secondary hyperparathyroidism of renal origin: Secondary | ICD-10-CM | POA: Insufficient documentation

## 2019-07-15 DIAGNOSIS — E559 Vitamin D deficiency, unspecified: Secondary | ICD-10-CM | POA: Insufficient documentation

## 2019-07-16 ENCOUNTER — Telehealth: Payer: Self-pay

## 2019-07-16 IMAGING — DX CHEST - 2 VIEW
3 series · 3 of 3 positions shown · non-contrast
Comparison: 04/09/2019, 08/23/2018

CLINICAL DATA: Chest pain

EXAM:
CHEST - 2 VIEW

[chest pa (1 of 2)]
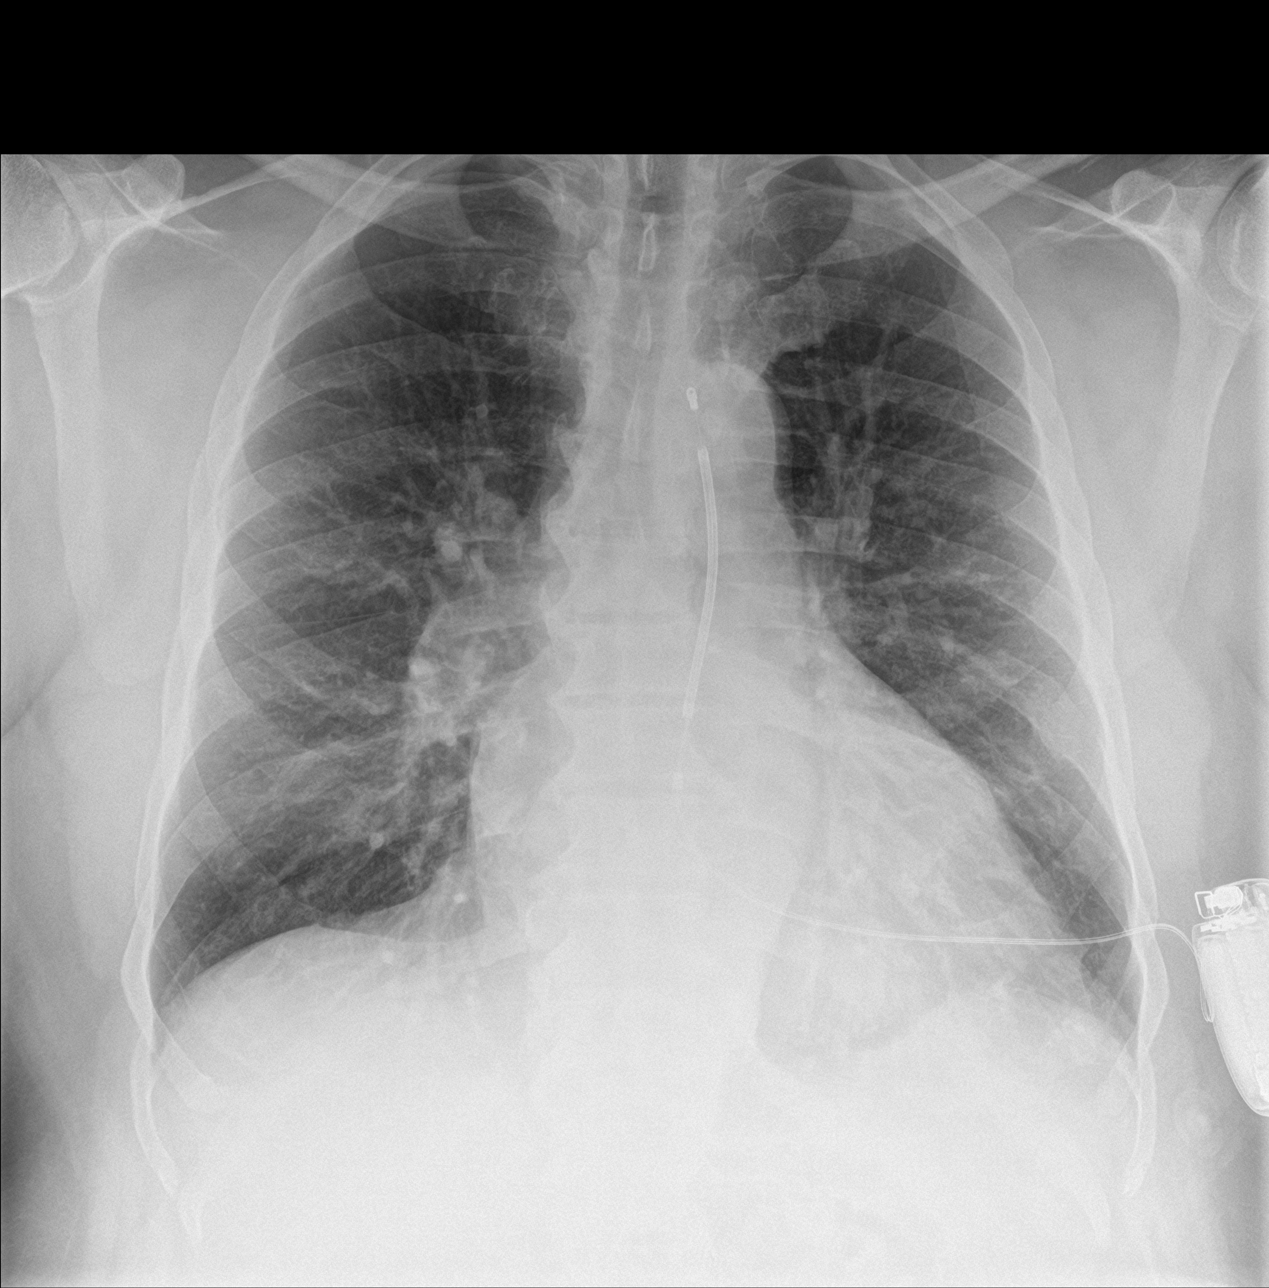

[chest lat]
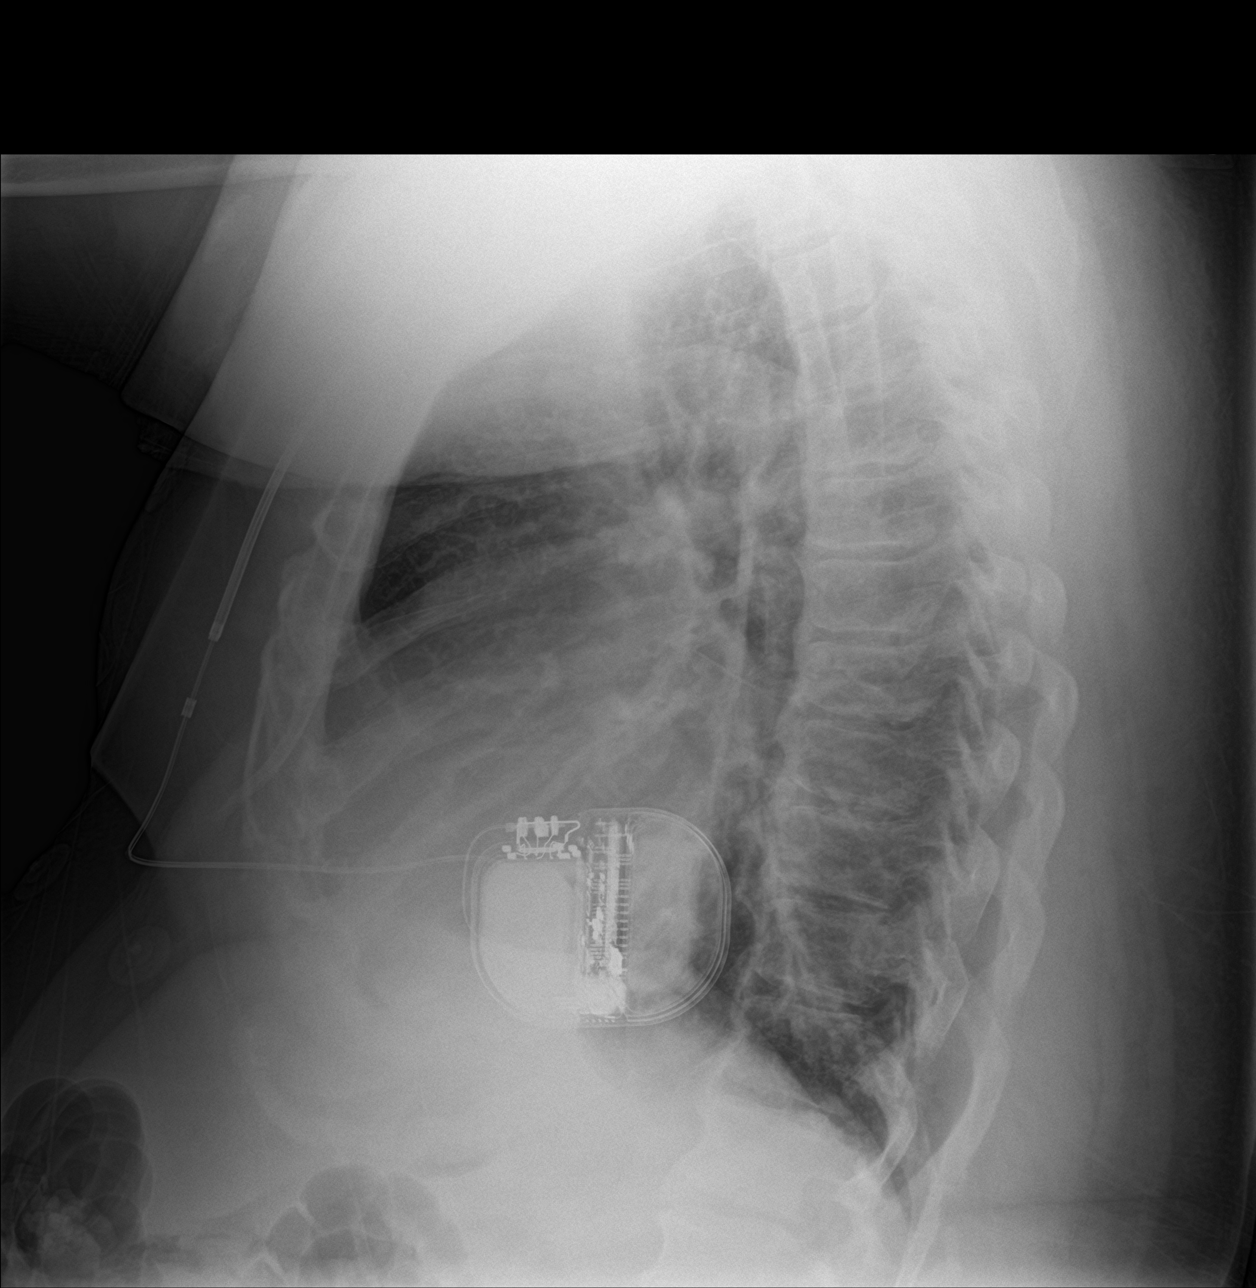

[chest pa (2 of 2)]
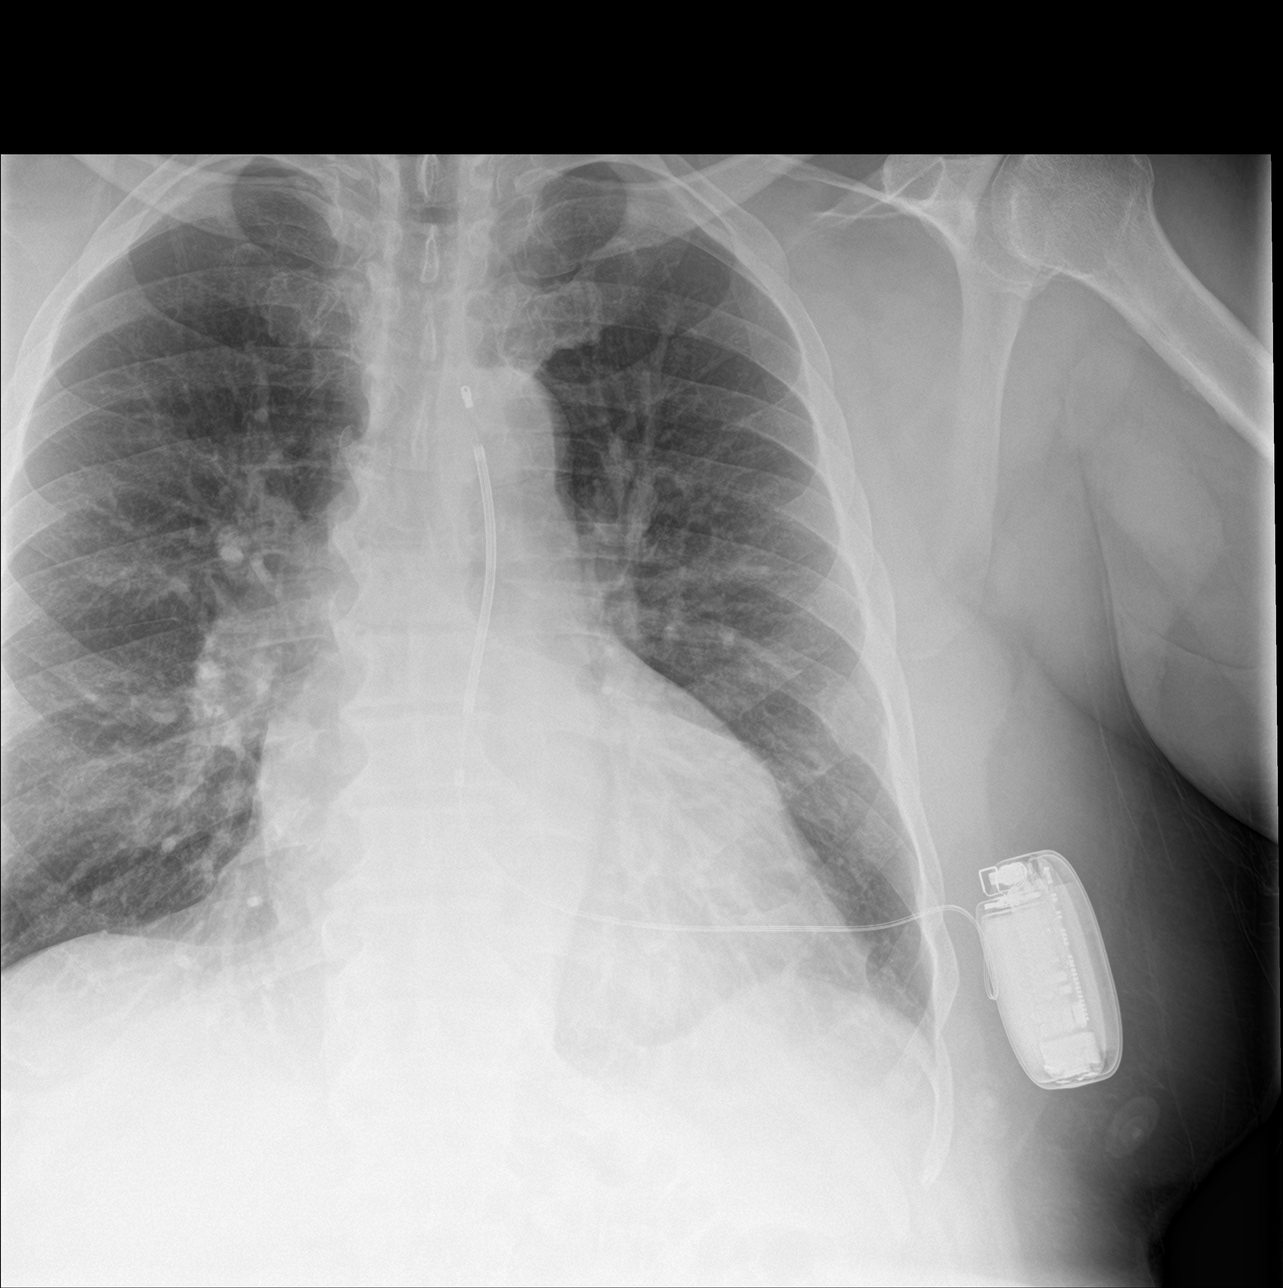

[3 of 3 positions shown; findings below may reference images not displayed]

FINDINGS: Similar appearance of left-sided cardiac pacing device. Mild
cardiomegaly. Bronchitic changes. No consolidation or effusion. No
pneumothorax.
IMPRESSION: Cardiomegaly with mild central congestion.  Bronchitic changes.

## 2019-07-16 NOTE — Telephone Encounter (Signed)
Notes recorded by Frederik Schmidt, RN on 07/16/2019 at 8:39 AM EDT  The patient has been notified of the result and verbalized understanding. All questions (if any) were answered.  Frederik Schmidt, RN 07/16/2019 8:39 AM

## 2019-07-16 NOTE — Telephone Encounter (Signed)
-----   Message from Sueanne Margarita, MD sent at 07/14/2019 12:10 PM EDT ----- Stable labs - continue current meds and forward to PCP and nephrologist

## 2019-07-20 ENCOUNTER — Ambulatory Visit: Payer: BC Managed Care – PPO | Admitting: Internal Medicine

## 2019-07-23 ENCOUNTER — Other Ambulatory Visit: Payer: Self-pay

## 2019-07-23 ENCOUNTER — Emergency Department (HOSPITAL_COMMUNITY)
Admission: EM | Admit: 2019-07-23 | Discharge: 2019-07-23 | Disposition: A | Discharge status: Home / Self Care | Payer: BC Managed Care – PPO | Attending: Emergency Medicine | Admitting: Emergency Medicine

## 2019-07-23 ENCOUNTER — Emergency Department (HOSPITAL_COMMUNITY): Payer: BC Managed Care – PPO

## 2019-07-23 ENCOUNTER — Encounter (HOSPITAL_COMMUNITY): Payer: Self-pay

## 2019-07-23 DIAGNOSIS — Z79899 Other long term (current) drug therapy: Secondary | ICD-10-CM | POA: Diagnosis not present

## 2019-07-23 DIAGNOSIS — I252 Old myocardial infarction: Secondary | ICD-10-CM | POA: Insufficient documentation

## 2019-07-23 DIAGNOSIS — I132 Hypertensive heart and chronic kidney disease with heart failure and with stage 5 chronic kidney disease, or end stage renal disease: Secondary | ICD-10-CM | POA: Insufficient documentation

## 2019-07-23 DIAGNOSIS — R062 Wheezing: Secondary | ICD-10-CM | POA: Insufficient documentation

## 2019-07-23 DIAGNOSIS — Z7982 Long term (current) use of aspirin: Secondary | ICD-10-CM | POA: Diagnosis not present

## 2019-07-23 DIAGNOSIS — I5022 Chronic systolic (congestive) heart failure: Secondary | ICD-10-CM | POA: Diagnosis not present

## 2019-07-23 DIAGNOSIS — Z95810 Presence of automatic (implantable) cardiac defibrillator: Secondary | ICD-10-CM | POA: Diagnosis not present

## 2019-07-23 DIAGNOSIS — R2243 Localized swelling, mass and lump, lower limb, bilateral: Secondary | ICD-10-CM | POA: Insufficient documentation

## 2019-07-23 DIAGNOSIS — N185 Chronic kidney disease, stage 5: Secondary | ICD-10-CM | POA: Insufficient documentation

## 2019-07-23 DIAGNOSIS — R0602 Shortness of breath: Secondary | ICD-10-CM | POA: Diagnosis present

## 2019-07-23 LAB — CBC WITH DIFFERENTIAL/PLATELET
Abs Immature Granulocytes: 0.02 10*3/uL (ref 0.00–0.07)
Basophils Absolute: 0 10*3/uL (ref 0.0–0.1)
Basophils Relative: 0 %
Eosinophils Absolute: 0.4 10*3/uL (ref 0.0–0.5)
Eosinophils Relative: 5 %
HCT: 32.2 % — ABNORMAL LOW (ref 39.0–52.0)
Hemoglobin: 10 g/dL — ABNORMAL LOW (ref 13.0–17.0)
Immature Granulocytes: 0 %
Lymphocytes Relative: 9 %
Lymphs Abs: 0.6 10*3/uL — ABNORMAL LOW (ref 0.7–4.0)
MCH: 29.5 pg (ref 26.0–34.0)
MCHC: 31.1 g/dL (ref 30.0–36.0)
MCV: 95 fL (ref 80.0–100.0)
Monocytes Absolute: 0.6 10*3/uL (ref 0.1–1.0)
Monocytes Relative: 8 %
Neutro Abs: 5.5 10*3/uL (ref 1.7–7.7)
Neutrophils Relative %: 78 %
Platelets: 170 10*3/uL (ref 150–400)
RBC: 3.39 MIL/uL — ABNORMAL LOW (ref 4.22–5.81)
RDW: 15.4 % (ref 11.5–15.5)
WBC: 7.1 10*3/uL (ref 4.0–10.5)
nRBC: 0 % (ref 0.0–0.2)

## 2019-07-23 LAB — COMPREHENSIVE METABOLIC PANEL
ALT: 20 U/L (ref 0–44)
AST: 22 U/L (ref 15–41)
Albumin: 3.2 g/dL — ABNORMAL LOW (ref 3.5–5.0)
Alkaline Phosphatase: 59 U/L (ref 38–126)
Anion gap: 11 (ref 5–15)
BUN: 62 mg/dL — ABNORMAL HIGH (ref 6–20)
CO2: 20 mmol/L — ABNORMAL LOW (ref 22–32)
Calcium: 8.2 mg/dL — ABNORMAL LOW (ref 8.9–10.3)
Chloride: 111 mmol/L (ref 98–111)
Creatinine, Ser: 7.56 mg/dL — ABNORMAL HIGH (ref 0.61–1.24)
GFR calc Af Amer: 8 mL/min — ABNORMAL LOW (ref >60)
GFR calc non Af Amer: 7 mL/min — ABNORMAL LOW (ref >60)
Glucose, Bld: 108 mg/dL — ABNORMAL HIGH (ref 70–99)
Potassium: 3.7 mmol/L (ref 3.5–5.1)
Sodium: 142 mmol/L (ref 135–145)
Total Bilirubin: 0.7 mg/dL (ref 0.3–1.2)
Total Protein: 6.4 g/dL — ABNORMAL LOW (ref 6.5–8.1)

## 2019-07-23 LAB — TROPONIN I (HIGH SENSITIVITY)
Troponin I (High Sensitivity): 65 ng/L — ABNORMAL HIGH (ref <18)
Troponin I (High Sensitivity): 64 ng/L — ABNORMAL HIGH (ref <18)

## 2019-07-23 LAB — BRAIN NATRIURETIC PEPTIDE: B Natriuretic Peptide: 697.5 pg/mL — ABNORMAL HIGH (ref 0.0–100.0)

## 2019-07-23 MED ORDER — IPRATROPIUM BROMIDE HFA 17 MCG/ACT IN AERS
2.0000 | INHALATION_SPRAY | Freq: Once | RESPIRATORY_TRACT | Status: AC
  Administered 2019-07-23: 2 via RESPIRATORY_TRACT
  Filled 2019-07-23: qty 12.9

## 2019-07-23 MED ORDER — PREDNISONE 20 MG PO TABS: 40.0000 mg | ORAL_TABLET | Freq: Every day | ORAL | 0 refills | Status: DC

## 2019-07-23 MED ORDER — PREDNISONE 20 MG PO TABS
60.0000 mg | ORAL_TABLET | Freq: Once | ORAL | Status: AC
  Administered 2019-07-23: 60 mg via ORAL
  Filled 2019-07-23: qty 3

## 2019-07-23 MED ORDER — ALBUTEROL SULFATE HFA 108 (90 BASE) MCG/ACT IN AERS
6.0000 | INHALATION_SPRAY | RESPIRATORY_TRACT | Status: DC | PRN
  Administered 2019-07-23 (×2): 6 via RESPIRATORY_TRACT
  Filled 2019-07-23: qty 6.7

## 2019-07-23 NOTE — Discharge Instructions (Signed)
Please read and follow all provided instructions.  Your diagnoses today include:  1. Wheezing   2. Chronic systolic heart failure (HCC)   3. Stage 5 chronic kidney disease not on chronic dialysis ([place])     Tests performed today include:  Blood counts and electrolytes  Chest x-ray  Heart failure blood test -stable  Blood test for heart attack -stable  Vital signs. See below for your results today.   Medications prescribed:   Prednisone - steroid medicine   It is best to take this medication in the morning to prevent sleeping problems. If you are diabetic, monitor your blood sugar closely and stop taking Prednisone if blood sugar is over 300. Take with food to prevent stomach upset.    Albuterol inhaler - medication that opens up your airway  Use inhaler as follows: 1-2 puffs with spacer every 4 hours as needed for wheezing, cough, or shortness of breath.    Atrovent - breathing medication  Use inhaler as follows: 1 puffs daily for wheezing  Take any prescribed medications only as directed.  Home care instructions:  Follow any educational materials contained in this packet.  Follow-up instructions: Please follow-up with your primary care provider in the next 2 days for further evaluation of your symptoms and management of your asthma.  Return instructions:   Please return to the Emergency Department if you experience worsening symptoms.  Please return with worsening wheezing, shortness of breath, or difficulty breathing.  Return with persistent fever above 101F.   Please return if you have any other emergent concerns.  Additional Information:  Your vital signs today were: BP (!) 142/92 (BP Location: Left Arm)    Pulse 80    Temp 98.2 F (36.8 C) (Oral)    Resp 18    Ht 5\' 7"  (1.702 m)    Wt 132.5 kg    SpO2 97%    BMI 45.75 kg/m  If your blood pressure (BP) was elevated above 135/85 this visit, please have this repeated by your doctor within one  month. --------------

## 2019-07-23 NOTE — ED Provider Notes (Signed)
Medical screening examination/treatment/procedure(s) were conducted as a shared visit with non-physician practitioner(s) and myself.  I personally evaluated the patient during the encounter.  EKG Interpretation  Date/Time:  Monday July 23 2019 07:40:06 EDT Ventricular Rate:  76 PR Interval:    QRS Duration: 152 QT Interval:  464 QTC Calculation: 522 R Axis:   -60 Text Interpretation:  Sinus rhythm Multiple ventricular premature complexes Nonspecific IVCD with LAD Left ventricular hypertrophy Anterior Q waves, possibly due to LVH agree, no sig change from previous Confirmed by Charlesetta Shanks [phone]) on 07/23/2019 11:22:01 AM  Patient with severe comorbid illness.  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  Reports he had a fan blowing and wore a mask but he has had wheezing and increased shortness of breath since that time.  He reports he is using his albuterol which helps a little bit.  He denies he is having any chest pain.  He denies fever.  He reports he does have some cough with clear sputum.  He reports he feels like it is either his asthma or possibly some fluid buildup.  Patient is alert and appropriate.  Nontoxic.  Mild increased work of breathing at rest.  Lungs have diffuse wheeze bilaterally.  Heart is regular without gross rub murmur gallop.  Patient's legs with pitting edema bilaterally symmetric.  Patient has significant history of chronic kidney disease as well as asthma.  Patient has not yet on dialysis.  Chest x-ray is not showing vascular congestion.  He does have wheezing somewhat more suggestive of asthma exacerbation.  Treatment initiated with prednisone albuterol.  I discussed overnight treatment and observation with the patient.  He reports he does not want to be admitted to the hospital this time and wants to try outpatient treatment.  He reports he will return immediately if his symptoms are worsening but at this time does  not want to be in the hospital.  Patient has a chronic severe baseline disease.  But his oxygenation is stable, he reports he can walk 50 yards, which point he needs to rest.  We will continue treatment emergency department and reassess prior to discharge.    Charlesetta Shanks, MD 07/23/19 1143

## 2019-07-23 NOTE — ED Triage Notes (Signed)
Pt complains of wheezing, shob that began 2 days ago while working around dust that aggravated his breathing. Has inhaler at home and has been using it with some relief. Has hx of CHF. Denies increased swelling. VSS

## 2019-07-23 NOTE — ED Notes (Addendum)
Pt ambulated once around the nursing unit. spo2 remained at 97% the entire time, pt did have to stop and rest against the hall rail half way through though due to fatigue. Complains of fatigue more so than shob.

## 2019-07-23 NOTE — ED Notes (Signed)
Patient verbalizes understanding of discharge instructions. Opportunity for questioning and answers were provided. Armband removed by staff, pt discharged from ED.  

## 2019-07-23 NOTE — ED Provider Notes (Signed)
[facility] EMERGENCY DEPARTMENT Provider Note   CSN: [id number] Arrival date & time: 07/23/19  7510     History   Chief Complaint Chief Complaint  Patient presents with  . Shortness of Breath    HPI Victor Thompson is a 58 y.o. male.     Patient with history of CAD (PCI 2007), ICM w/hx of arrest and ICD (at [facility] center in 2015), HTN, HLD, CKD (approaching dialysis), AVF creation April 2020, chronic CHF (systolic), OSA w/CPAP, morbid obesity --presents to the emergency department today with complaint of worsening shortness of breath over the past 2 days.  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  He was wearing a mask however that does seem to irritate his breathing.  He has had some wheezing improved for a short time with albuterol.  States compliance with his medications including torsemide.  Denies any fevers.  He has an occasional nonproductive cough.  He reports may be slightly worse lower extremity edema than baseline.  Recently patient was seen in the emergency department, treated with additional Lasix, albuterol, prednisone.  He was discharged to home.  He followed up with his cardiologist 10 days ago.  His weight is actually 1.3 kg lower today than it was at his cardiology appointment.  Onset of symptoms acute.  Course is worsening.      Past Medical History:  Diagnosis Date  . AICD (automatic cardioverter/defibrillator) present    2015  . Asthma    no meds  . Cardiac arrest ([place]) 2015  . CHF (congestive heart failure) ([place])   . Chronic kidney disease    ckd -stage 5  . Coronary artery disease   . Hypertension   . Myocardial infarction ([place])   . Obesity   . Pneumonia   . Pulmonary edema   . Shortness of breath dyspnea   . Sleep apnea    USES CPAP  . Wears glasses     Patient Active Problem List   Diagnosis Date Noted  . Secondary hyperparathyroidism of renal origin ([place])  07/15/2019  . Vitamin D deficiency 07/15/2019  . CAD (coronary artery disease), native coronary artery 04/10/2019  . CKD (chronic kidney disease) stage 5, GFR less than 15 ml/min (HCC) 09/26/2018  . Chronic midline low back pain without sciatica 02/17/2018  . COPD exacerbation ([place]) 03/04/2016  . Cardiac arrest with ventricular fibrillation ([place]) 03/04/2016  . Defibrillator discharge   . Chronic combined systolic and diastolic CHF (congestive heart failure) ([place]) 01/22/2016  . Erectile dysfunction 01/22/2016  . Allergic rhinitis 01/22/2016  . GERD (gastroesophageal reflux disease) 01/22/2016  . PROTEINURIA 02/17/2010  . Cardiomyopathy, ischemic 02/16/2010  . Obstructive sleep apnea 02/16/2010  . EXTERNAL HEMORRHOIDS 09/26/2009  . Tobacco use disorder in remission 06/03/2009  . Essential hypertension 10/01/2008  . Hyperlipidemia 07/14/2007  . OBESITY 07/14/2007  . Asthma 07/14/2007    Past Surgical History:  Procedure Laterality Date  . AV FISTULA PLACEMENT Right 03/15/2019   Procedure: RIGHT ARM ARTERIOVENOUS (AV) FISTULA CREATION;  Surgeon: Angelia Mould, MD;  Location: [facility];  Service: Vascular;  Laterality: Right;  . COLONOSCOPY    . COLONOSCOPY W/ BIOPSIES AND POLYPECTOMY    . CORONARY STENT PLACEMENT  2007  . IMPLANTABLE CARDIOVERTER DEFIBRILLATOR IMPLANT  2015        Home Medications    Prior to Admission medications   Medication Sig Start Date End Date Taking? Authorizing Provider  albuterol (PROVENTIL HFA;VENTOLIN HFA) 108 (90 Base) MCG/ACT inhaler Inhale 2 puffs into the lungs every 4 (four) hours as needed for wheezing or shortness of breath. 02/06/16   Charlott Rakes, MD  amLODipine (NORVASC) 10 MG tablet Take 1 tablet (10 mg total) by mouth daily. 06/28/19   [name], PA-C  aspirin EC 81 MG tablet Take 1 tablet (81 mg total) by mouth daily. 01/22/16   [name], [name], MD  atorvastatin (LIPITOR) 40 MG tablet Take 1 tablet (40 mg total) by mouth at  bedtime. 06/28/19   [name], PA-C  calcitRIOL (ROCALTROL) 0.5 MCG capsule Take 0.5 mcg by mouth daily.  08/16/18   [provider]  carvedilol (COREG) 25 MG tablet Take 1 tablet (25 mg total) by mouth 2 (two) times daily with a meal. 06/28/19   McClung, Dionne Bucy, PA-C  Cholecalciferol (VITAMIN D-3) 125 MCG (5000 UT) TABS Take 5,000 Units by mouth daily.     [provider]  cyclobenzaprine (FLEXERIL) 10 MG tablet TAKE 1 TABLET (10 MG TOTAL) BY MOUTH AT BEDTIME. AS NEEDED FOR PAIN Patient taking differently: Take 10 mg by mouth at bedtime as needed for muscle spasms.  12/21/18   [name], MD  EPINEPHrine 0.3 mg/0.3 mL IJ SOAJ injection Inject 0.3 mLs into the muscle once as needed for anaphylaxis.  02/06/16   [provider]  fluticasone (FLONASE) 50 MCG/ACT nasal spray Place 1 spray into both nostrils daily. Patient taking differently: Place 1 spray into both nostrils daily as needed for allergies.  12/31/18   Volanda Napoleon, PA-C  hydrALAZINE (APRESOLINE) 10 MG tablet Take 1 tablet (10 mg total) by mouth 3 (three) times daily. 06/28/19 06/27/20  [name], PA-C  isosorbide mononitrate (IMDUR) 30 MG 24 hr tablet Take 1 tablet (30 mg total) by mouth daily. 06/28/19 06/27/20  [name], PA-C  loratadine (CLARITIN) 10 MG tablet Take 10 mg by mouth daily as needed for allergies.    [provider]  PRESCRIPTION MEDICATION Inhale into the lungs at bedtime. CPAP    [provider]  torsemide (DEMADEX) 20 MG tablet Take 2 tablets (40 mg total) by mouth daily. 06/28/19   [name], PA-C    Family History Family History  Problem Relation Age of Onset  . Hypertension Mother   . Liver disease Father     Social History Social History   Tobacco Use  . Smoking status: Never Smoker  . Smokeless tobacco: Never Used  Substance Use Topics  . Alcohol use: No  . Drug use: No     Allergies   Ace inhibitors, Influenza  vaccines, Ketorolac, Lidocaine, Lisinopril, and Penicillins   Review of Systems Review of Systems  Constitutional: Negative for fever.  HENT: Negative for rhinorrhea and sore throat.   Eyes: Negative for redness.  Respiratory: Positive for cough and shortness of breath.   Cardiovascular: Positive for leg swelling. Negative for chest pain.  Gastrointestinal: Negative for abdominal pain, diarrhea, nausea and vomiting.  Genitourinary: Negative for dysuria.  Musculoskeletal: Negative for myalgias.  Skin: Negative for rash.  Neurological: Negative for headaches.     Physical Exam Updated Vital Signs BP 123/82 (BP Location: Left Arm)   Pulse 78   Temp 98.2 F (36.8 C) (Oral)   Resp 20   Ht 5\' 7"  (1.702 m)   Wt 132.5 kg   SpO2 97%   BMI 45.75 kg/m   Physical Exam Vitals signs and nursing note  reviewed.  Constitutional:      Appearance: He is well-developed. He is not diaphoretic.  HENT:     Head: Normocephalic and atraumatic.     Mouth/Throat:     Mouth: Mucous membranes are not dry.  Eyes:     Conjunctiva/sclera: Conjunctivae normal.  Neck:     Musculoskeletal: Normal range of motion and neck supple. No muscular tenderness.     Vascular: Normal carotid pulses. No carotid bruit or JVD.     Trachea: Trachea normal. No tracheal deviation.  Cardiovascular:     Rate and Rhythm: Normal rate and regular rhythm.     Pulses: No decreased pulses.     Heart sounds: Normal heart sounds, S1 normal and S2 normal. Heart sounds not distant. No murmur.  Pulmonary:     Effort: Pulmonary effort is normal. No respiratory distress.     Breath sounds: Decreased breath sounds present. No wheezing (No wheezing appreciated on exam) or rhonchi.  Chest:     Chest wall: No tenderness.  Abdominal:     General: Bowel sounds are normal.     Palpations: Abdomen is soft.     Tenderness: There is no abdominal tenderness. There is no guarding or rebound.  Musculoskeletal:     Right lower leg: Edema  present.     Left lower leg: Edema present.     Comments: 1-2+ symmetric edema bilaterally lower extremities to the knees.   Skin:    General: Skin is warm and dry.     Coloration: Skin is not pale.  Neurological:     Mental Status: He is alert.      ED Treatments / Results  Labs (all labs ordered are listed, but only abnormal results are displayed) Labs Reviewed  CBC WITH DIFFERENTIAL/PLATELET - Abnormal; Notable for the following components:      Result Value   RBC 3.39 (*)    Hemoglobin 10.0 (*)    HCT 32.2 (*)    Lymphs Abs 0.6 (*)    All other components within normal limits  COMPREHENSIVE METABOLIC PANEL - Abnormal; Notable for the following components:   CO2 20 (*)    Glucose, Bld 108 (*)    BUN 62 (*)    Creatinine, Ser 7.56 (*)    Calcium 8.2 (*)    Total Protein 6.4 (*)    Albumin 3.2 (*)    GFR calc non Af Amer 7 (*)    GFR calc Af Amer 8 (*)    All other components within normal limits  BRAIN NATRIURETIC PEPTIDE - Abnormal; Notable for the following components:   B Natriuretic Peptide 697.5 (*)    All other components within normal limits  TROPONIN I (HIGH SENSITIVITY) - Abnormal; Notable for the following components:   Troponin I (High Sensitivity) 65 (*)    All other components within normal limits  TROPONIN I (HIGH SENSITIVITY)    ED ECG REPORT   Date: 07/23/2019  Rate: 76  Rhythm: normal sinus rhythm and premature ventricular contractions (PVC)  QRS Axis: left  Intervals: normal  ST/T Wave abnormalities: normal  Conduction Disutrbances:left bundle branch block  Narrative Interpretation:   Old EKG Reviewed: unchanged  I have personally reviewed the EKG tracing and agree with the computerized printout as noted.  Radiology Dg Chest Port 1 View  Result Date: 07/23/2019 CLINICAL DATA:  Shortness of breath and wheezing EXAM: PORTABLE CHEST 1 VIEW COMPARISON:  July 03, 2019 FINDINGS: A small portion of the left base is  not imaged on this study.  There is mild left base atelectasis. Lungs elsewhere are clear. There is cardiomegaly with pulmonary vascularity normal. Pacemaker device is unchanged in position with lead overlying the midline upper sternal region. There is an old healed fracture of the posterior eighth rib on the left. No pneumothorax. IMPRESSION: Stable position of pacemaker device. Mild left base atelectasis. No edema or consolidation. Note that a portion of the left base is not imaged. Stable cardiomegaly. Electronically Signed   By: Lowella Grip III M.D.   On: 07/23/2019 08:13    Procedures Procedures (including critical care time)  Medications Ordered in ED Medications  albuterol (VENTOLIN HFA) 108 (90 Base) MCG/ACT inhaler 6 puff (6 puffs Inhalation Given 07/23/19 1303)  ipratropium (ATROVENT HFA) inhaler 2 puff (2 puffs Inhalation Given 07/23/19 1127)  predniSONE (DELTASONE) tablet 60 mg (60 mg Oral Given 07/23/19 1123)  ipratropium (ATROVENT HFA) inhaler 2 puff (2 puffs Inhalation Given 07/23/19 1311)     Initial Impression / Assessment and Plan / ED Course  I have reviewed the triage vital signs and the nursing notes.  Pertinent labs & imaging results that were available during my care of the patient were reviewed by me and considered in my medical decision making (see chart for details).        Patient seen and examined.  Weights appear to be stable.  Lab work, chest x-ray, EKG ordered.  Will ambulate and check oxygen level as symptoms are worse with ambulation.  Patient with increased respiratory rate after walking from the bed to the hallway where a scale was placed, and back to the bed.  Oxygen level 94-95% on room air.  Vital signs reviewed and are as follows: BP 123/82 (BP Location: Left Arm)   Pulse 78   Temp 98.2 F (36.8 C) (Oral)   Resp 20   Ht 5\' 7"  (1.702 m)   Wt 132.5 kg   SpO2 97%   BMI 45.75 kg/m   10:49 AM.  Patient felt somewhat better after albuterol but needs additional  medication.  Will order prednisone and Atrovent as well.  Overall picture is less concerning for congestive heart failure as weights are stable, chest x-ray is clear, BNP is stable.  CKD slightly worse today but BUN is slightly better.  We will continue to work on breathing.  Patient has ambulated without oxygen desaturation.  1:08 PM Patient discussed with and seen by Dr. Johnney Killian earlier.  Patient with more wheezing and better air movement after albuterol and Atrovent.  He states continued improvement.  Admission for continued treatment discussed with patient.  He does not want to be admitted to the hospital.  He states that he would rather use inhalers at home, take his fluid pills, take steroid and follow-up with his doctors.  He states that he is willing to come back if something gets worse.  Troponin 65>> 64, so this is stable.  We will give additional dose of albuterol and Atrovent prior to discharge.  Sitting in bed patient with oxygen saturation of 98% on room air.  No respiratory distress.  No complaints of pain.  1:19 PM patient rechecked, continues to do well.  He can speak in full sentences without any apparent shortness of breath.  Lungs continue to improve with mild residual wheezing.  Will discharge to home with inhalers, prednisone.   Discussed need to return with worsening breathing or shortness of breath.  Encouraged PCP follow-up in 2 days for recheck.  He  will continue following up with his nephrologist given his kidney function.  Final Clinical Impressions(s) / ED Diagnoses   Final diagnoses:  Wheezing  Chronic systolic heart failure ([place])  Stage 5 chronic kidney disease not on chronic dialysis [facility])   Patient here with wheezing and shortness of breath, improved with albuterol and Atrovent.  Started on another steroid burst.  He is not hypoxic and is able to ambulate without dropping oxygen saturation.  Chest x-ray is clear.  From a heart failure standpoint, patient appears  compensated.  Weights are stable.  BNP is stable.  Chest does not show any signs of edema.  No JVD or significant worsening of his chronic lower extremity edema.  From a chronic kidney disease standpoint, creatinine today is slightly worse than previous, BUN slightly improved.  No significant electrolyte abnormalities.  Again no significant fluid overload to suggest that this is acutely worsening.  Patient follows with nephrology and will need to continue to do so.  ED Discharge Orders         Ordered    predniSONE (DELTASONE) 20 MG tablet  Daily     07/23/19 1320           Carlisle Cater, PA-C 07/23/19 1326    Charlesetta Shanks, MD 07/24/19 [phone]

## 2019-07-25 MED FILL — ATORVASTATIN CALCIUM 40 MG: 40 | 90 days supply | Qty: 90 | Fill #0

## 2019-07-25 MED FILL — TORSEMIDE 20 MG TABLET: 20 | 30 days supply | Qty: 60 | Fill #5

## 2019-07-25 MED FILL — AMLODIPINE BESYLATE 10 MG T: 10 | 30 days supply | Qty: 30 | Fill #1

## 2019-07-25 MED FILL — ISOSORBIDE MN ER 30 MG TAB: 30 | 30 days supply | Qty: 30 | Fill #1

## 2019-08-16 MED FILL — hydrALAZINE HCL 10 MG TABS: 10 | 30 days supply | Qty: 90 | Fill #1

## 2019-08-16 MED FILL — CALCITRIOL 0.5 MCG CAPS: 0.5 | 30 days supply | Qty: 30 | Fill #7

## 2019-08-23 MED FILL — CARVEDILOL 25 MG TABLET: 25 | 30 days supply | Qty: 60 | Fill #1

## 2019-09-05 ENCOUNTER — Encounter: Payer: Self-pay | Admitting: Internal Medicine

## 2019-09-05 NOTE — Progress Notes (Signed)
Note from Nephrology Dr. Joelyn Oms. Pt seen 08/08/2019. Pt has CKD 5 with eGFR 10 - declined KT due to LVEF 30-35%, tentative plan is HD. 2HPTH Vit D def

## 2019-09-11 MED FILL — ISOSORBIDE MN ER 30 MG TAB: 30 | 30 days supply | Qty: 30 | Fill #2

## 2019-09-14 ENCOUNTER — Emergency Department (HOSPITAL_COMMUNITY)
Admission: EM | Admit: 2019-09-14 | Discharge: 2019-09-14 | Disposition: A | Discharge status: Home / Self Care | Payer: BC Managed Care – PPO | Attending: Emergency Medicine | Admitting: Emergency Medicine

## 2019-09-14 ENCOUNTER — Other Ambulatory Visit: Payer: Self-pay

## 2019-09-14 ENCOUNTER — Encounter (HOSPITAL_COMMUNITY): Payer: Self-pay | Admitting: Emergency Medicine

## 2019-09-14 DIAGNOSIS — Z79899 Other long term (current) drug therapy: Secondary | ICD-10-CM | POA: Diagnosis not present

## 2019-09-14 DIAGNOSIS — I132 Hypertensive heart and chronic kidney disease with heart failure and with stage 5 chronic kidney disease, or end stage renal disease: Secondary | ICD-10-CM | POA: Insufficient documentation

## 2019-09-14 DIAGNOSIS — N185 Chronic kidney disease, stage 5: Secondary | ICD-10-CM | POA: Insufficient documentation

## 2019-09-14 DIAGNOSIS — T7840XA Allergy, unspecified, initial encounter: Secondary | ICD-10-CM | POA: Insufficient documentation

## 2019-09-14 DIAGNOSIS — Z7982 Long term (current) use of aspirin: Secondary | ICD-10-CM | POA: Insufficient documentation

## 2019-09-14 DIAGNOSIS — I2510 Atherosclerotic heart disease of native coronary artery without angina pectoris: Secondary | ICD-10-CM | POA: Diagnosis not present

## 2019-09-14 DIAGNOSIS — J449 Chronic obstructive pulmonary disease, unspecified: Secondary | ICD-10-CM | POA: Diagnosis not present

## 2019-09-14 DIAGNOSIS — I509 Heart failure, unspecified: Secondary | ICD-10-CM | POA: Insufficient documentation

## 2019-09-14 MED ORDER — PREDNISONE 20 MG PO TABS: 40.0000 mg | ORAL_TABLET | Freq: Every day | ORAL | 0 refills | Status: AC

## 2019-09-14 MED ORDER — PREDNISONE 20 MG PO TABS
40.0000 mg | ORAL_TABLET | Freq: Once | ORAL | Status: AC
  Administered 2019-09-14: 40 mg via ORAL
  Filled 2019-09-14: qty 2

## 2019-09-14 NOTE — ED Notes (Signed)
Patient verbalizes understanding of discharge instructions. Opportunity for questioning and answers were provided. Armband removed by staff, pt discharged from ED.  

## 2019-09-14 NOTE — ED Triage Notes (Signed)
Pt here from home with c/o allergic reaction no airway involvement at triage , pt is requesting pills only hx of same

## 2019-09-14 NOTE — Discharge Instructions (Signed)
You can take one dose of over the counter benadryl when you get home. If your symptoms are improved you do not have to continue taking it.   Take prednisone as directed.   Return to the Emergency Department for any trouble breathing, vomiting, swelling of tongue or lips, fevers or any other worsening or concerning symptoms.

## 2019-09-14 NOTE — ED Notes (Addendum)
Pt able to ingest fluids without difficulty or complaint. Pt denies airway swelling at this time.

## 2019-09-14 NOTE — ED Provider Notes (Signed)
[facility] EMERGENCY DEPARTMENT Provider Note   CSN: [id number] Arrival date & time: 09/14/19  1532     History   Chief Complaint Chief Complaint  Patient presents with  . Allergic Reaction    HPI Victor Thompson is a 58 y.o. male history of asthma, cardiac arrest, CHF, chronic kidney disease, CAD, hypertension, myocardial infarction who presents for evaluation of possible allergic reaction.  He reports that about 230 this afternoon, he felt like his tongue was swelling up.  He states that he did not have any trouble breathing, vomiting.  He states he was concerned because he has had angioedema secondary to lisinopril before and so he came to emergency department.  On ED arrival, he said that swelling has completely resolved.  He does not feel like his tongue or lips are swollen at all.  He states he never any swelling of his face.  States he did eat some hamburger mac & cheese that tasted a little funny to him before symptoms started.  He has never had reaction to that before.  He also ate some shrimp but states he has eaten shrimp before that he difficulty.  No new medications current 20.  None lisinopril usage.  Patient denies any chest pain, difficulty breathing, vomiting, difficulty swallowing his saliva.     The history is provided by the patient.    Past Medical History:  Diagnosis Date  . AICD (automatic cardioverter/defibrillator) present    2015  . Asthma    no meds  . Cardiac arrest ([place]) 2015  . CHF (congestive heart failure) ([employer])   . Chronic kidney disease    ckd -stage 5  . Coronary artery disease   . Hypertension   . Myocardial infarction ([place])   . Obesity   . Pneumonia   . Pulmonary edema   . Shortness of breath dyspnea   . Sleep apnea    USES CPAP  . Wears glasses     Patient Active Problem List   Diagnosis Date Noted  . Secondary hyperparathyroidism of renal origin ([place]) 07/15/2019  . Vitamin D deficiency 07/15/2019  . CAD (coronary  artery disease), native coronary artery 04/10/2019  . CKD (chronic kidney disease) stage 5, GFR less than 15 ml/min (HCC) 09/26/2018  . Chronic midline low back pain without sciatica 02/17/2018  . COPD exacerbation ([place]) 03/04/2016  . Cardiac arrest with ventricular fibrillation ([place]) 03/04/2016  . Defibrillator discharge   . Chronic combined systolic and diastolic CHF (congestive heart failure) ([place]) 01/22/2016  . Erectile dysfunction 01/22/2016  . Allergic rhinitis 01/22/2016  . GERD (gastroesophageal reflux disease) 01/22/2016  . PROTEINURIA 02/17/2010  . Cardiomyopathy, ischemic 02/16/2010  . Obstructive sleep apnea 02/16/2010  . EXTERNAL HEMORRHOIDS 09/26/2009  . Tobacco use disorder in remission 06/03/2009  . Essential hypertension 10/01/2008  . Hyperlipidemia 07/14/2007  . OBESITY 07/14/2007  . Asthma 07/14/2007    Past Surgical History:  Procedure Laterality Date  . AV FISTULA PLACEMENT Right 03/15/2019   Procedure: RIGHT ARM ARTERIOVENOUS (AV) FISTULA CREATION;  Surgeon: Angelia Mould, MD;  Location: [facility];  Service: Vascular;  Laterality: Right;  . COLONOSCOPY    . COLONOSCOPY W/ BIOPSIES AND POLYPECTOMY    . CORONARY STENT PLACEMENT  2007  . IMPLANTABLE CARDIOVERTER DEFIBRILLATOR IMPLANT  2015        Home Medications    Prior to Admission medications   Medication Sig Start Date End Date Taking? Authorizing Provider  albuterol (PROVENTIL HFA;VENTOLIN HFA) 108 (90 Base)  MCG/ACT inhaler Inhale 2 puffs into the lungs every 4 (four) hours as needed for wheezing or shortness of breath. 02/06/16   Charlott Rakes, MD  amLODipine (NORVASC) 10 MG tablet Take 1 tablet (10 mg total) by mouth daily. 06/28/19   [name], PA-C  aspirin EC 81 MG tablet Take 1 tablet (81 mg total) by mouth daily. 01/22/16   [name], [name], MD  atorvastatin (LIPITOR) 40 MG tablet Take 1 tablet (40 mg total) by mouth at bedtime. 06/28/19   [name], PA-C  calcitRIOL  (ROCALTROL) 0.5 MCG capsule Take 0.5 mcg by mouth daily.  08/16/18   [provider]  carvedilol (COREG) 25 MG tablet Take 1 tablet (25 mg total) by mouth 2 (two) times daily with a meal. 06/28/19   McClung, Dionne Bucy, PA-C  Cholecalciferol (VITAMIN D-3) 125 MCG (5000 UT) TABS Take 5,000 Units by mouth daily.     [provider]  cyclobenzaprine (FLEXERIL) 10 MG tablet TAKE 1 TABLET (10 MG TOTAL) BY MOUTH AT BEDTIME. AS NEEDED FOR PAIN Patient taking differently: Take 10 mg by mouth at bedtime as needed for muscle spasms.  12/21/18   [name], MD  EPINEPHrine 0.3 mg/0.3 mL IJ SOAJ injection Inject 0.3 mLs into the muscle once as needed for anaphylaxis.  02/06/16   [provider]  fluticasone (FLONASE) 50 MCG/ACT nasal spray Place 1 spray into both nostrils daily. Patient taking differently: Place 1 spray into both nostrils daily as needed for allergies.  12/31/18   Volanda Napoleon, PA-C  hydrALAZINE (APRESOLINE) 10 MG tablet Take 1 tablet (10 mg total) by mouth 3 (three) times daily. 06/28/19 06/27/20  [name], PA-C  isosorbide mononitrate (IMDUR) 30 MG 24 hr tablet Take 1 tablet (30 mg total) by mouth daily. 06/28/19 06/27/20  [name], PA-C  loratadine (CLARITIN) 10 MG tablet Take 10 mg by mouth daily as needed for allergies.    [provider]  predniSONE (DELTASONE) 20 MG tablet Take 2 tablets (40 mg total) by mouth daily for 3 days. 09/14/19 09/17/19  Volanda Napoleon, PA-C  torsemide (DEMADEX) 20 MG tablet Take 2 tablets (40 mg total) by mouth daily. 06/28/19   [name], PA-C    Family History Family History  Problem Relation Age of Onset  . Hypertension Mother   . Liver disease Father     Social History Social History   Tobacco Use  . Smoking status: Never Smoker  . Smokeless tobacco: Never Used  Substance Use Topics  . Alcohol use: No  . Drug use: No     Allergies   Ace inhibitors, Influenza vaccines,  Ketorolac, Lidocaine, Lisinopril, and Penicillins   Review of Systems Review of Systems  Respiratory: Negative for shortness of breath.   Cardiovascular: Negative for chest pain.  Gastrointestinal: Negative for nausea and vomiting.  All other systems reviewed and are negative.    Physical Exam Updated Vital Signs BP 139/73 (BP Location: Right Arm)   Pulse 75   Temp 98.7 F (37.1 C) (Oral)   Resp 20   SpO2 99%   Physical Exam Vitals signs and nursing note reviewed.  Constitutional:      Appearance: He is well-developed.  HENT:     Head: Normocephalic and atraumatic.     Comments: Face is symmetric in appearance without any overlying warmth, erythema, edema.    Mouth/Throat:     Mouth: Mucous membranes are moist.     Pharynx:  Oropharynx is clear.     Comments: Airways pain, phonation is intact.  Uvula is midline.  Tongue without any swelling.  No oral angioedema. Eyes:     General: No scleral icterus.       Right eye: No discharge.        Left eye: No discharge.     Conjunctiva/sclera: Conjunctivae normal.  Pulmonary:     Effort: Pulmonary effort is normal.     Comments: Lungs clear to auscultation bilaterally.  Symmetric chest rise.  No wheezing, rales, rhonchi. Skin:    General: Skin is warm and dry.  Neurological:     Mental Status: He is alert.  Psychiatric:        Speech: Speech normal.        Behavior: Behavior normal.      ED Treatments / Results  Labs (all labs ordered are listed, but only abnormal results are displayed) Labs Reviewed - No data to display  EKG None  Radiology No results found.  Procedures Procedures (including critical care time)  Medications Ordered in ED Medications  predniSONE (DELTASONE) tablet 40 mg (40 mg Oral Given 09/14/19 1644)     Initial Impression / Assessment and Plan / ED Course  I have reviewed the triage vital signs and the nursing notes.  Pertinent labs & imaging results that were available during my care  of the patient were reviewed by me and considered in my medical decision making (see chart for details).        58 year old male who presents for evaluation of possible allergic reaction.  Reports about 230, he felt like his tongue was swelling.  Does endorse eating some hamburger mac & cheese prior to onset of symptoms.  On ED arrival, states symptoms are improved but he was concerned because he has had a reaction to lisinopril before.  He is not currently on any lisinopril.  He denies any trouble swallowing, nausea/vomiting, difficulty breathing. Patient is afebrile, non-toxic appearing, sitting comfortably on examination table. Vital signs reviewed and stable.  On exam, no evidence of oral angioedema.  He is currently not taking Lisinopril. Doubt ACE reaction.  We will plan to observe, give a dose of prednisone p.o. challenge.  Reevaluation.  Patient is resting comfortably in bed.  No signs of distress.  Denies any symptoms at this time.  He was able to tolerate p.o. with any difficulty.  Repeat evaluation shows no evidence of oral angioedema.  He denies any symptoms at this time.  Unclear etiology of his symptoms.  Question if he had larger backslash ultrasound.  At this time, he is hemodynamically stable.  Will give short course of prednisone.  I discussed with him regarding close monitoring of Benadryl given his renal function.  Instructed him to only take 1 dose today. At this time, patient exhibits no emergent life-threatening condition that require further evaluation in ED or admission. Patient had ample opportunity for questions and discussion. All patient's questions were answered with full understanding. Strict return precautions discussed. Patient expresses understanding and agreement to plan.   Portions of this note were generated with [employer]. Dictation errors may occur despite best attempts at proofreading.   Final Clinical Impressions(s) / ED Diagnoses   Final  diagnoses:  Allergic reaction, initial encounter    ED Discharge Orders         Ordered    predniSONE (DELTASONE) 20 MG tablet  Daily     09/14/19 1720  Volanda Napoleon, PA-C 09/14/19 1743    Davonna Belling, MD 09/14/19 [phone]

## 2019-09-17 MED FILL — TORSEMIDE 20 MG TABLET: 20 | 30 days supply | Qty: 60 | Fill #6

## 2019-09-19 ENCOUNTER — Ambulatory Visit (HOSPITAL_COMMUNITY): Payer: BC Managed Care – PPO | Attending: Internal Medicine

## 2019-09-19 ENCOUNTER — Other Ambulatory Visit: Payer: Self-pay

## 2019-09-19 DIAGNOSIS — I5042 Chronic combined systolic (congestive) and diastolic (congestive) heart failure: Secondary | ICD-10-CM | POA: Insufficient documentation

## 2019-09-25 MED FILL — AMLODIPINE BESYLATE 10 MG T: 10 | 30 days supply | Qty: 30 | Fill #2

## 2019-10-01 ENCOUNTER — Ambulatory Visit: Payer: BC Managed Care – PPO | Attending: Internal Medicine | Admitting: Internal Medicine

## 2019-10-01 ENCOUNTER — Encounter: Payer: Self-pay | Admitting: Internal Medicine

## 2019-10-01 ENCOUNTER — Other Ambulatory Visit: Payer: Self-pay

## 2019-10-01 VITALS — BP 130/90 | HR 78 | Temp 98.2°F | Resp 16 | Ht 67.0 in | Wt 276.4 lb

## 2019-10-01 DIAGNOSIS — I10 Essential (primary) hypertension: Secondary | ICD-10-CM

## 2019-10-01 DIAGNOSIS — I5042 Chronic combined systolic (congestive) and diastolic (congestive) heart failure: Secondary | ICD-10-CM

## 2019-10-01 DIAGNOSIS — Z2821 Immunization not carried out because of patient refusal: Secondary | ICD-10-CM

## 2019-10-01 DIAGNOSIS — I2510 Atherosclerotic heart disease of native coronary artery without angina pectoris: Secondary | ICD-10-CM

## 2019-10-01 DIAGNOSIS — J4530 Mild persistent asthma, uncomplicated: Secondary | ICD-10-CM

## 2019-10-01 DIAGNOSIS — N2581 Secondary hyperparathyroidism of renal origin: Secondary | ICD-10-CM

## 2019-10-01 DIAGNOSIS — Z6841 Body Mass Index (BMI) 40.0 and over, adult: Secondary | ICD-10-CM

## 2019-10-01 DIAGNOSIS — E66813 Obesity, class 3: Secondary | ICD-10-CM

## 2019-10-01 DIAGNOSIS — N185 Chronic kidney disease, stage 5: Secondary | ICD-10-CM

## 2019-10-01 MED ORDER — BUDESONIDE-FORMOTEROL FUMARATE 80-4.5 MCG/ACT IN AERO: 2.0000 | INHALATION_SPRAY | Freq: Two times a day (BID) | RESPIRATORY_TRACT | 6 refills | Status: DC

## 2019-10-01 MED ORDER — ALBUTEROL SULFATE (2.5 MG/3ML) 0.083% IN NEBU: 2.5000 mg | INHALATION_SOLUTION | Freq: Four times a day (QID) | RESPIRATORY_TRACT | 12 refills | Status: DC | PRN

## 2019-10-01 MED ORDER — HYDRALAZINE HCL 10 MG PO TABS: 20.0000 mg | ORAL_TABLET | Freq: Two times a day (BID) | ORAL | 6 refills | Status: DC

## 2019-10-01 MED ORDER — CARVEDILOL 25 MG PO TABS: 25.0000 mg | ORAL_TABLET | Freq: Two times a day (BID) | ORAL | 1 refills | Status: DC

## 2019-10-01 MED ORDER — ALBUTEROL SULFATE HFA 108 (90 BASE) MCG/ACT IN AERS: 2.0000 | INHALATION_SPRAY | RESPIRATORY_TRACT | 6 refills | Status: DC | PRN

## 2019-10-01 MED ORDER — ISOSORBIDE MONONITRATE ER 30 MG PO TB24: 30.0000 mg | ORAL_TABLET | Freq: Every day | ORAL | 1 refills | Status: DC

## 2019-10-01 MED ORDER — TORSEMIDE 20 MG PO TABS: 40.0000 mg | ORAL_TABLET | Freq: Every day | ORAL | 1 refills | Status: DC

## 2019-10-01 MED FILL — SYMBICORT 80-4.5 MCG INH: 80-4.5 | 30 days supply | Qty: 10 | Fill #0

## 2019-10-01 MED FILL — hydrALAZINE HCL 10 MG TABS: 10 | 90 days supply | Qty: 360 | Fill #0

## 2019-10-01 MED FILL — ALBUTEROL SUL 2.5 MG/3 ML S: (2.5 MG/3ML | 6 days supply | Qty: 75 | Fill #0

## 2019-10-01 MED FILL — VENTOLIN HFA 90 MCG INHALER: 108 (90 BAS | 16 days supply | Qty: 18 | Fill #0

## 2019-10-01 MED FILL — CARVEDILOL 25 MG TABLET: 25 | 90 days supply | Qty: 180 | Fill #0

## 2019-10-01 NOTE — Patient Instructions (Signed)
Increase hydralazine to 20 mg twice a day for better blood pressure control.  I have refilled your albuterol inhaler.  We have added an inhaler called Symbicort that you will use twice a day.  I have put you in for pulmonary function tests to evaluate your lung function.

## 2019-10-01 NOTE — Progress Notes (Signed)
Patient ID: Victor Thompson, male    DOB: 1961-06-25  MRN: [id number]  CC: Hypertension   Subjective: Victor Thompson is a 57 y.o. male who presents for chronic ds management His concerns today include:  Pt with hx of CKD stage 5, HTN, CAD (with hx of cardiac arrest and), ICD, combined sys/dia CHF, HL, Obese, COPD, chronic lower back pain with multilevel spondylosis  COPD/asthma  Seen in ER intermittently for breathing issues.  Given breatthing treatments and prednisone which hepls. Feels he needs neb machine and Prednisone -intermittent cough. + wheezing intermittently more on weekends.  SOB sometimes at nights but not lately Nonsmoker Uses albuterol inh 3-4 x a wk  CAD/combine CHF/ICD:  No LE edema/CP/no PND lately/sleeps on 2 pillows Meds:  Taking meds consistently but takes Hydralazine BID instead of TID.  Does not want to take Imdur but still taking it. Saw cardiology 2.5 wks ago but forgot to discuss then Had echo last month with improve EF from 25-30% in July to 40-45%  CKD 5/ Sec Hyperparathyroid/Vit D def: He is followed by Dr. Joelyn Oms.  He now has a graft in the right upper extremity.   Has follow-up appointment with nephrologist appt later this wk.  Still making good urine He has secondary hyperparathyroidism.  This is being followed by nephrology.  Obesity:  Loss 19 lbs since August "I cut back on eating some of the craziness.  I try to eat right." Gets out of breath after walking 50 yards.  Has to stop and rest.  Patient Active Problem List   Diagnosis Date Noted  . Secondary hyperparathyroidism of renal origin ([place]) 07/15/2019  . Vitamin D deficiency 07/15/2019  . CAD (coronary artery disease), native coronary artery 04/10/2019  . CKD (chronic kidney disease) stage 5, GFR less than 15 ml/min (HCC) 09/26/2018  . Chronic midline low back pain without sciatica 02/17/2018  . COPD exacerbation ([place]) 03/04/2016  . Cardiac arrest with ventricular fibrillation ([place])  03/04/2016  . Defibrillator discharge   . Chronic combined systolic and diastolic CHF (congestive heart failure) ([place]) 01/22/2016  . Erectile dysfunction 01/22/2016  . Allergic rhinitis 01/22/2016  . GERD (gastroesophageal reflux disease) 01/22/2016  . PROTEINURIA 02/17/2010  . Cardiomyopathy, ischemic 02/16/2010  . Obstructive sleep apnea 02/16/2010  . EXTERNAL HEMORRHOIDS 09/26/2009  . Tobacco use disorder in remission 06/03/2009  . Essential hypertension 10/01/2008  . Hyperlipidemia 07/14/2007  . OBESITY 07/14/2007  . Asthma 07/14/2007     Current Outpatient Medications on File Prior to Visit  Medication Sig Dispense Refill  . albuterol (PROVENTIL HFA;VENTOLIN HFA) 108 (90 Base) MCG/ACT inhaler Inhale 2 puffs into the lungs every 4 (four) hours as needed for wheezing or shortness of breath. 54 g 3  . amLODipine (NORVASC) 10 MG tablet Take 1 tablet (10 mg total) by mouth daily. 90 tablet 1  . aspirin EC 81 MG tablet Take 1 tablet (81 mg total) by mouth daily. 30 tablet 5  . atorvastatin (LIPITOR) 40 MG tablet Take 1 tablet (40 mg total) by mouth at bedtime. 90 tablet 1  . calcitRIOL (ROCALTROL) 0.5 MCG capsule Take 0.5 mcg by mouth daily.   11  . carvedilol (COREG) 25 MG tablet Take 1 tablet (25 mg total) by mouth 2 (two) times daily with a meal. 180 tablet 1  . Cholecalciferol (VITAMIN D-3) 125 MCG (5000 UT) TABS Take 5,000 Units by mouth daily.     . cyclobenzaprine (FLEXERIL) 10 MG tablet TAKE 1 TABLET (10 MG TOTAL) BY  MOUTH AT BEDTIME. AS NEEDED FOR PAIN (Patient taking differently: Take 10 mg by mouth at bedtime as needed for muscle spasms. ) 30 tablet 0  . EPINEPHrine 0.3 mg/0.3 mL IJ SOAJ injection Inject 0.3 mLs into the muscle once as needed for anaphylaxis.     . fluticasone (FLONASE) 50 MCG/ACT nasal spray Place 1 spray into both nostrils daily. (Patient taking differently: Place 1 spray into both nostrils daily as needed for allergies. ) 16 g 2  . hydrALAZINE (APRESOLINE)  10 MG tablet Take 1 tablet (10 mg total) by mouth 3 (three) times daily. 270 tablet 1  . isosorbide mononitrate (IMDUR) 30 MG 24 hr tablet Take 1 tablet (30 mg total) by mouth daily. 90 tablet 1  . loratadine (CLARITIN) 10 MG tablet Take 10 mg by mouth daily as needed for allergies.    Marland Kitchen torsemide (DEMADEX) 20 MG tablet Take 2 tablets (40 mg total) by mouth daily. 180 tablet 1   No current facility-administered medications on file prior to visit.     Allergies  Allergen Reactions  . Ace Inhibitors Swelling    Swelling of the tongue  . Influenza Vaccines Hives and Swelling    SWELLING REACTION UNSPECIFIED   . Ketorolac Swelling    SWELLING REACTION UNSPECIFIED   . Lidocaine Swelling    TONGUE SWELLS  . Lisinopril Swelling    TONGUE SWELLING Pt reported problem with a BP med which sounded like  lisinopril  But as of 09/19/06,pt had tolerated altace without problem  . Penicillins Swelling    TONGUE SWELLS Has patient had a PCN reaction causing immediate rash, facial/tongue/throat swelling, SOB or lightheadedness with hypotension: Yes Has patient had a PCN reaction causing severe rash involving mucus membranes or skin necrosis: No Has patient had a PCN reaction that required hospitalization No Has patient had a PCN reaction occurring within the last 10 years: Yes If all of the above answers are "NO", then may proceed with Cephalosporin use.     Social History   Socioeconomic History  . Marital status: Married    Spouse name: Not on file  . Number of children: Not on file  . Years of education: Not on file  . Highest education level: Not on file  Occupational History  . Not on file  Social Needs  . Financial resource strain: Not on file  . Food insecurity    Worry: Not on file    Inability: Not on file  . Transportation needs    Medical: Not on file    Non-medical: Not on file  Tobacco Use  . Smoking status: Never Smoker  . Smokeless tobacco: Never Used  Substance and  Sexual Activity  . Alcohol use: No  . Drug use: No  . Sexual activity: Yes  Lifestyle  . Physical activity    Days per week: Not on file    Minutes per session: Not on file  . Stress: Not on file  Relationships  . Social Herbalist on phone: Not on file    Gets together: Not on file    Attends religious service: Not on file    Active member of club or organization: Not on file    Attends meetings of clubs or organizations: Not on file    Relationship status: Not on file  . Intimate partner violence    Fear of current or ex partner: Not on file    Emotionally abused: Not on file  Physically abused: Not on file    Forced sexual activity: Not on file  Other Topics Concern  . Not on file  Social History Narrative   ** Merged History Encounter **        Family History  Problem Relation Age of Onset  . Hypertension Mother   . Liver disease Father     Past Surgical History:  Procedure Laterality Date  . AV FISTULA PLACEMENT Right 03/15/2019   Procedure: RIGHT ARM ARTERIOVENOUS (AV) FISTULA CREATION;  Surgeon: Angelia Mould, MD;  Location: [facility];  Service: Vascular;  Laterality: Right;  . COLONOSCOPY    . COLONOSCOPY W/ BIOPSIES AND POLYPECTOMY    . CORONARY STENT PLACEMENT  2007  . IMPLANTABLE CARDIOVERTER DEFIBRILLATOR IMPLANT  2015    ROS: Review of Systems Negative except as stated above  PHYSICAL EXAM: BP (!) 157/98   Pulse 78   Temp 98.2 F (36.8 C) (Oral)   Resp 16   Ht 5\' 7"  (1.702 m)   Wt 276 lb 6.4 oz (125.4 kg)   SpO2 95%   BMI 43.29 kg/m   Wt Readings from Last 3 Encounters:  10/01/19 276 lb 6.4 oz (125.4 kg)  07/23/19 292 lb 2 oz (132.5 kg)  07/13/19 295 lb (133.8 kg)   Physical Exam General appearance - alert, well appearing, and in no distress Mental status - normal mood, behavior, speech, dress, motor activity, and thought processes Mouth - mucous membranes moist, pharynx normal without lesions Neck - supple, no  significant adenopathy Chest - clear to auscultation, no wheezes, rales or rhonchi, symmetric air entry Heart -regular rate rhythm, 2 out of 6 systolic ejection murmur along left sternal border.  S4 gallop Extremities -trace lower extremity edema CMP Latest Ref Rng & Units 07/23/2019 07/13/2019 07/03/2019  Glucose 70 - 99 mg/dL 108(H) 106(H) 112(H)  BUN 6 - 20 mg/dL 62(H) 77(HH) 47(H)  Creatinine 0.61 - 1.24 mg/dL 7.56(H) 6.61(H) 7.09(H)  Sodium 135 - 145 mmol/L 142 146(H) 143  Potassium 3.5 - 5.1 mmol/L 3.7 4.7 3.7  Chloride 98 - 111 mmol/L 111 108(H) 112(H)  CO2 22 - 32 mmol/L 20(L) 19(L) 17(L)  Calcium 8.9 - 10.3 mg/dL 8.2(L) 8.1(L) 8.1(L)  Total Protein 6.5 - 8.1 g/dL 6.4(L) - -  Total Bilirubin 0.3 - 1.2 mg/dL 0.7 - -  Alkaline Phos 38 - 126 U/L 59 - -  AST 15 - 41 U/L 22 - -  ALT 0 - 44 U/L 20 - -   Lipid Panel     Component Value Date/Time   CHOL 182 03/05/2016 0503   TRIG 118 03/05/2016 0503   HDL 38 (L) 03/05/2016 0503   CHOLHDL 4.8 03/05/2016 0503   VLDL 24 03/05/2016 0503   LDLCALC 120 (H) 03/05/2016 0503    CBC    Component Value Date/Time   WBC 7.1 07/23/2019 0820   RBC 3.39 (L) 07/23/2019 0820   HGB 10.0 (L) 07/23/2019 0820   HGB 11.9 (L) 07/27/2018 1006   HCT 32.2 (L) 07/23/2019 0820   HCT 35.1 (L) 07/27/2018 1006   PLT 170 07/23/2019 0820   PLT 205 07/27/2018 1006   MCV 95.0 07/23/2019 0820   MCV 88 07/27/2018 1006   MCH 29.5 07/23/2019 0820   MCHC 31.1 07/23/2019 0820   RDW 15.4 07/23/2019 0820   RDW 16.1 (H) 07/27/2018 1006   LYMPHSABS 0.6 (L) 07/23/2019 0820   LYMPHSABS 1.1 07/27/2018 1006   MONOABS 0.6 07/23/2019 0820   EOSABS 0.4  07/23/2019 0820   EOSABS 0.3 07/27/2018 1006   BASOSABS 0.0 07/23/2019 0820   BASOSABS 0.0 07/27/2018 1006    ASSESSMENT AND PLAN: 1. Essential hypertension Not at goal.  Increase hydralazine to 20 mg but change to twice a daily dosing.  Continue other medications including carvedilol - hydrALAZINE (APRESOLINE) 10  MG tablet; Take 2 tablets (20 mg total) by mouth 2 (two) times daily.  Dispense: 120 tablet; Refill: 6 - carvedilol (COREG) 25 MG tablet; Take 1 tablet (25 mg total) by mouth 2 (two) times daily with a meal.  Dispense: 180 tablet; Refill: 1  2. Mild persistent asthma without complication Patient given nebulizer machine and albuterol treatments to use with it. I have also started him on Symbicort.  Went over how to use.  Encouraged him to rinse mouth after each use. - Pulmonary function test; Future  3. CKD (chronic kidney disease) stage 5, GFR less than 15 ml/min (HCC) Followed by nephrology.  Being prepared for dialysis  4. Chronic combined systolic and diastolic CHF (congestive heart failure) ([place]) He has a little bit of edema in the legs today.  DASH diet encouraged.  Continue current dose of torsemide - carvedilol (COREG) 25 MG tablet; Take 1 tablet (25 mg total) by mouth 2 (two) times daily with a meal.  Dispense: 180 tablet; Refill: 1 - isosorbide mononitrate (IMDUR) 30 MG 24 hr tablet; Take 1 tablet (30 mg total) by mouth daily.  Dispense: 90 tablet; Refill: 1 - torsemide (DEMADEX) 20 MG tablet; Take 2 tablets (40 mg total) by mouth daily.  Dispense: 180 tablet; Refill: 1  5. Class 3 severe obesity due to excess calories with serious comorbidity and body mass index (BMI) of 40.0 to 44.9 in adult [facility]) Commended him on weight loss.  Encourage to continue healthy eating habits.  We will see if his breathing gets better with the Symbicort inhaler and if so he may be able to move more  6. Coronary artery disease involving native coronary artery of native heart without angina pectoris Stable. - carvedilol (COREG) 25 MG tablet; Take 1 tablet (25 mg total) by mouth 2 (two) times daily with a meal.  Dispense: 180 tablet; Refill: 1 - isosorbide mononitrate (IMDUR) 30 MG 24 hr tablet; Take 1 tablet (30 mg total) by mouth daily.  Dispense: 90 tablet; Refill: 1  7. Secondary hyperparathyroidism  of renal origin [facility]) Followed by nephrology  8. Influenza vaccination declined  Patient was given the opportunity to ask questions.  Patient verbalized understanding of the plan and was able to repeat key elements of the plan.   No orders of the defined types were placed in this encounter.    Requested Prescriptions    No prescriptions requested or ordered in this encounter    No follow-ups on file.  Karle Plumber, MD, FACP

## 2019-10-03 MED FILL — CALCITRIOL CAP 0.5 MCG: 0.5 | 30 days supply | Qty: 30 | Fill #0

## 2019-10-16 ENCOUNTER — Ambulatory Visit (INDEPENDENT_AMBULATORY_CARE_PROVIDER_SITE_OTHER): Payer: BC Managed Care – PPO | Admitting: Student

## 2019-10-16 ENCOUNTER — Other Ambulatory Visit: Payer: Self-pay

## 2019-10-16 DIAGNOSIS — I472 Ventricular tachycardia, unspecified: Secondary | ICD-10-CM

## 2019-10-16 DIAGNOSIS — I5042 Chronic combined systolic (congestive) and diastolic (congestive) heart failure: Secondary | ICD-10-CM | POA: Diagnosis not present

## 2019-10-16 LAB — CUP PACEART INCLINIC DEVICE CHECK
Date Time Interrogation Session: 20201110084514
Implantable Lead Implant Date: 20150507
Implantable Lead Location: 753862
Implantable Lead Model: 3400
Implantable Pulse Generator Implant Date: 20150507
Pulse Gen Model: 1010
Pulse Gen Serial Number: 14970

## 2019-10-16 NOTE — Progress Notes (Signed)
Subcutaneous ICD check in clinic. 0 untreated episodes; 0 treated episodes; 0 shocks delivered. Electrode impedance status okay. No programming changes. Remaining longevity to ERI 16%. RTC 6 months for battery check.    See scanned report.

## 2019-10-24 MED FILL — ISOSORBIDE MN ER 30 MG TAB: 30 | 30 days supply | Qty: 30 | Fill #3

## 2019-11-07 MED FILL — AMLODIPINE BESYLATE 10 MG T: 10 | 30 days supply | Qty: 30 | Fill #3

## 2019-11-07 MED FILL — TORSEMIDE 20 MG TABLET: 20 | 30 days supply | Qty: 60 | Fill #7

## 2019-11-08 ENCOUNTER — Other Ambulatory Visit (HOSPITAL_COMMUNITY): Payer: Self-pay | Admitting: Internal Medicine

## 2019-11-08 ENCOUNTER — Other Ambulatory Visit: Payer: Self-pay

## 2019-11-08 ENCOUNTER — Ambulatory Visit: Payer: BC Managed Care – PPO | Attending: Internal Medicine | Admitting: Internal Medicine

## 2019-11-08 ENCOUNTER — Other Ambulatory Visit: Payer: Self-pay | Admitting: Internal Medicine

## 2019-11-08 ENCOUNTER — Encounter: Payer: Self-pay | Admitting: Internal Medicine

## 2019-11-08 DIAGNOSIS — I2510 Atherosclerotic heart disease of native coronary artery without angina pectoris: Secondary | ICD-10-CM

## 2019-11-08 DIAGNOSIS — R0602 Shortness of breath: Secondary | ICD-10-CM

## 2019-11-08 DIAGNOSIS — I10 Essential (primary) hypertension: Secondary | ICD-10-CM

## 2019-11-08 DIAGNOSIS — I5042 Chronic combined systolic (congestive) and diastolic (congestive) heart failure: Secondary | ICD-10-CM

## 2019-11-08 DIAGNOSIS — J4530 Mild persistent asthma, uncomplicated: Secondary | ICD-10-CM

## 2019-11-08 NOTE — Progress Notes (Signed)
Virtual Visit via Telephone Note Due to current restrictions/limitations of in-office visits due to the COVID-19 pandemic, this scheduled clinical appointment was converted to a telehealth visit  I connected with Victor Thompson on 11/08/19 at 4:33 p.m by telephone and verified that I am speaking with the correct person using two identifiers. I am in my office.  The patient is at home.  Only the patient and myself participated in this encounter.  I discussed the limitations, risks, security and privacy concerns of performing an evaluation and management service by telephone and the availability of in person appointments. I also discussed with the patient that there may be a patient responsible charge related to this service. The patient expressed understanding and agreed to proceed.   History of Present Illness: Pt with hx of CKD stage5 (Dr. Joelyn Oms), HTN, CAD (with hx of cardiac arrest and), ICD, combined sys/dia CHF (EF 40-45%08/2019), HL, Obese, COPD,chronic lower back pain with multilevel spondylosis.  Patient was last seen 10/01/2019.   HTN: On last visit his blood pressure was not controlled.  We increase hydralazine to 20 mg twice a day.  He reports compliance with hydralazine on his other blood pressure medications.  He limits salt in the foods.  No device to check blood pressure.  However he has an appointment coming up with his nephrologist on Monday of next week.       On last visit he also requested nebulizer machine because of intermittent breathing issues.  He was given a nebulizer and I also started him on Symbicort.  He cannot tell whether the Symbicort has helped with his breathing.  We ordered pulmonary function tests but patient states he has not been called for that as yet.  Shortness of breath is worse if he is walking and carrying something.  He endorses wheezing at nights.  No shortness of breath when laying down.  He sleeps with 2 pillows.  No lower extremity edema.  Used Neb 2  days ago Current Outpatient Medications on File Prior to Visit  Medication Sig Dispense Refill  . albuterol (PROVENTIL) (2.5 MG/3ML) 0.083% nebulizer solution Take 3 mLs (2.5 mg total) by nebulization every 6 (six) hours as needed for wheezing or shortness of breath. 75 mL 12  . albuterol (VENTOLIN HFA) 108 (90 Base) MCG/ACT inhaler Inhale 2 puffs into the lungs every 4 (four) hours as needed for wheezing or shortness of breath. 8 g 6  . amLODipine (NORVASC) 10 MG tablet Take 1 tablet (10 mg total) by mouth daily. 90 tablet 1  . aspirin EC 81 MG tablet Take 1 tablet (81 mg total) by mouth daily. 30 tablet 5  . atorvastatin (LIPITOR) 40 MG tablet Take 1 tablet (40 mg total) by mouth at bedtime. 90 tablet 1  . budesonide-formoterol (SYMBICORT) 80-4.5 MCG/ACT inhaler Inhale 2 puffs into the lungs 2 (two) times daily. 1 Inhaler 6  . calcitRIOL (ROCALTROL) 0.5 MCG capsule Take 0.5 mcg by mouth daily.   11  . carvedilol (COREG) 25 MG tablet Take 1 tablet (25 mg total) by mouth 2 (two) times daily with a meal. 180 tablet 1  . Cholecalciferol (VITAMIN D-3) 125 MCG (5000 UT) TABS Take 5,000 Units by mouth daily.     . cyclobenzaprine (FLEXERIL) 10 MG tablet TAKE 1 TABLET (10 MG TOTAL) BY MOUTH AT BEDTIME. AS NEEDED FOR PAIN (Patient taking differently: Take 10 mg by mouth at bedtime as needed for muscle spasms. ) 30 tablet 0  . EPINEPHrine 0.3 mg/0.3 mL  IJ SOAJ injection Inject 0.3 mLs into the muscle once as needed for anaphylaxis.     . fluticasone (FLONASE) 50 MCG/ACT nasal spray Place 1 spray into both nostrils daily. (Patient taking differently: Place 1 spray into both nostrils daily as needed for allergies. ) 16 g 2  . hydrALAZINE (APRESOLINE) 10 MG tablet Take 2 tablets (20 mg total) by mouth 2 (two) times daily. 120 tablet 6  . isosorbide mononitrate (IMDUR) 30 MG 24 hr tablet Take 1 tablet (30 mg total) by mouth daily. 90 tablet 1  . loratadine (CLARITIN) 10 MG tablet Take 10 mg by mouth daily as  needed for allergies.    Marland Kitchen torsemide (DEMADEX) 20 MG tablet Take 2 tablets (40 mg total) by mouth daily. 180 tablet 1   No current facility-administered medications on file prior to visit.     Observations/Objective: No direct observation done as this was a telephone encounter.  Assessment and Plan: 1. Shortness of breath Question of whether this is due to uncontrolled asthma/COPD versus CHF versus CAD versus deconditioning.  He reports no lower extremity edema or PND at this time to suggest decompensated CHF.  He does not feel it has improved with Symbicort either.  We will have him do a chest x-ray, schedule PFT as previously ordered and get him in with our pulmonologist Dr. Joya Gaskins.  I will also send a message to the cardiology PA who saw him back in August to see if they can get him in for reevaluation  2. Chronic combined systolic and diastolic CHF (congestive heart failure) (HCC) Continue current medications including his diuretic  3. Essential hypertension Continue hydralazine, carvedilol, torsemide and amlodipine.  He has an appointment with nephrology early next week and blood pressure will be checked on that visit.  4. Coronary artery disease involving native coronary artery of native heart without angina pectoris Continue Lipitor, isosorbide, carvedilol, aspirin.   Follow Up Instructions:    I discussed the assessment and treatment plan with the patient. The patient was provided an opportunity to ask questions and all were answered. The patient agreed with the plan and demonstrated an understanding of the instructions.   The patient was advised to call back or seek an in-person evaluation if the symptoms worsen or if the condition fails to improve as anticipated.  I provided 9 minutes of non-face-to-face time during this encounter.   Karle Plumber, MD

## 2019-11-14 ENCOUNTER — Ambulatory Visit (HOSPITAL_COMMUNITY)
Admission: RE | Admit: 2019-11-14 | Discharge: 2019-11-14 | Disposition: A | Discharge status: Home / Self Care | Payer: BC Managed Care – PPO | Source: Ambulatory Visit | Attending: Internal Medicine | Admitting: Internal Medicine

## 2019-11-14 ENCOUNTER — Telehealth: Payer: Self-pay | Admitting: Internal Medicine

## 2019-11-14 ENCOUNTER — Encounter: Payer: Self-pay | Admitting: Physician Assistant

## 2019-11-14 ENCOUNTER — Other Ambulatory Visit: Payer: Self-pay | Admitting: Internal Medicine

## 2019-11-14 ENCOUNTER — Telehealth: Payer: Self-pay

## 2019-11-14 ENCOUNTER — Ambulatory Visit (INDEPENDENT_AMBULATORY_CARE_PROVIDER_SITE_OTHER): Payer: BC Managed Care – PPO | Admitting: Physician Assistant

## 2019-11-14 ENCOUNTER — Other Ambulatory Visit: Payer: Self-pay

## 2019-11-14 ENCOUNTER — Encounter (HOSPITAL_COMMUNITY)
Admission: RE | Admit: 2019-11-14 | Discharge: 2019-11-14 | Disposition: A | Discharge status: Home / Self Care | Payer: BC Managed Care – PPO | Source: Ambulatory Visit | Attending: Internal Medicine | Admitting: Internal Medicine

## 2019-11-14 ENCOUNTER — Other Ambulatory Visit (HOSPITAL_COMMUNITY): Payer: Self-pay | Admitting: Internal Medicine

## 2019-11-14 VITALS — BP 130/72 | HR 81 | Ht 67.0 in | Wt 278.0 lb

## 2019-11-14 DIAGNOSIS — R0602 Shortness of breath: Secondary | ICD-10-CM | POA: Diagnosis not present

## 2019-11-14 DIAGNOSIS — N185 Chronic kidney disease, stage 5: Secondary | ICD-10-CM | POA: Diagnosis not present

## 2019-11-14 DIAGNOSIS — I255 Ischemic cardiomyopathy: Secondary | ICD-10-CM

## 2019-11-14 DIAGNOSIS — I5042 Chronic combined systolic (congestive) and diastolic (congestive) heart failure: Secondary | ICD-10-CM | POA: Diagnosis not present

## 2019-11-14 DIAGNOSIS — J4530 Mild persistent asthma, uncomplicated: Secondary | ICD-10-CM | POA: Insufficient documentation

## 2019-11-14 DIAGNOSIS — J189 Pneumonia, unspecified organism: Secondary | ICD-10-CM

## 2019-11-14 MED ORDER — MOXIFLOXACIN HCL 400 MG PO TABS: 400.0000 mg | ORAL_TABLET | Freq: Every day | ORAL | 0 refills | Status: DC

## 2019-11-14 MED ORDER — AZITHROMYCIN 250 MG PO TABS: ORAL_TABLET | ORAL | 0 refills | Status: DC

## 2019-11-14 MED ORDER — TECHNETIUM TO 99M ALBUMIN AGGREGATED
1.4800 | Freq: Once | INTRAVENOUS | Status: AC | PRN
  Administered 2019-11-14: 1.48 via INTRAVENOUS

## 2019-11-14 MED FILL — AZITHROMYCIN 250 MG TABLET: 250 | 5 days supply | Qty: 6 | Fill #0

## 2019-11-14 NOTE — Progress Notes (Signed)
Cardiology Office Note    Date:  11/14/2019   ID:  Victor Thompson, DOB [birth year], MRN [id number]  PCP:  [name], MD  Cardiologist:  Dr. Radford Pax (previously Dr. Doylene Canard) Electrophysiologist: Dr. Rayann Heman  Chief Complaint: SOB  History of Present Illness:   Victor Thompson is a 58 y.o. male with hx of ischemic dilated cardiomyopathy with cardiac arrest status post ICD at [facility] in 2015, CAD s/p PCI in 6967, chronic systolic and diastolic CHF, morbid obesity, OSA on CPAP, chronic kidney disease, hypertension and hyperlipidemia seen for SOB.   Echo in 2016 showed mildly reduced LV systolic function with EF 45 to 50% with moderate hypokinesis of the anterior myocardium and mild hypokinesis of the inferior myocardium.  There is grade 1 diastolic dysfunction.  Nuclear stress test in 2017 showed a large zone of nonreversible decreased perfusion in the inferior lateral wall extending into the inferior apex with myocardial thinning and LV dilatation.  There was no ischemia.  EF was 28%.   Established care with Dr. Radford Pax 04/2019. Echo 06/2019 showed 25-30% LVEF with grade 2 DD and mild MR. No ACE/ARB given CKD. He was placed on hydralazine and Imdur. Repeat echo 09/2019 showed improved LVEF to 40-45% and grade 2 DD. Recommended continued medical therapy.   Recently seen by PCP for SOB. Reviwed today's NM pulmonary perfusion>> negative for PE. CXR concerning for Pneumonia.   Here today for further evaluation. He has been dealing with SOB for few weeks. He denies cp, cough, congestion, fever, chills or COVID exposure. No orthopnea, pnd, le edema or syncope.   Past Medical History:  Diagnosis Date   AICD (automatic cardioverter/defibrillator) present    2015   Asthma    no meds   Cardiac arrest ([place]) 2015   CHF (congestive heart failure) (HCC)    Chronic kidney disease    ckd -stage 5   Coronary artery disease    Hypertension    Myocardial infarction ([place])     Obesity    Pneumonia    Pulmonary edema    Shortness of breath dyspnea    Sleep apnea    USES CPAP   Wears glasses     Past Surgical History:  Procedure Laterality Date   AV FISTULA PLACEMENT Right 03/15/2019   Procedure: RIGHT ARM ARTERIOVENOUS (AV) FISTULA CREATION;  Surgeon: Angelia Mould, MD;  Location: [facility];  Service: Vascular;  Laterality: Right;   COLONOSCOPY     COLONOSCOPY W/ BIOPSIES AND POLYPECTOMY     CORONARY STENT PLACEMENT  2007   IMPLANTABLE CARDIOVERTER DEFIBRILLATOR IMPLANT  2015    Current Medications: Prior to Admission medications   Medication Sig Start Date End Date Taking? Authorizing Provider  albuterol (PROVENTIL) (2.5 MG/3ML) 0.083% nebulizer solution Take 3 mLs (2.5 mg total) by nebulization every 6 (six) hours as needed for wheezing or shortness of breath. 10/01/19   [name], MD  albuterol (VENTOLIN HFA) 108 (90 Base) MCG/ACT inhaler Inhale 2 puffs into the lungs every 4 (four) hours as needed for wheezing or shortness of breath. 10/01/19   [name], MD  amLODipine (NORVASC) 10 MG tablet Take 1 tablet (10 mg total) by mouth daily. 06/28/19   [name], PA-C  aspirin EC 81 MG tablet Take 1 tablet (81 mg total) by mouth daily. 01/22/16   [name], [name], MD  atorvastatin (LIPITOR) 40 MG tablet Take 1 tablet (40 mg total) by mouth at bedtime. 06/28/19   Freeman Caldron  M, PA-C  budesonide-formoterol (SYMBICORT) 80-4.5 MCG/ACT inhaler Inhale 2 puffs into the lungs 2 (two) times daily. 10/01/19   [name], MD  calcitRIOL (ROCALTROL) 0.5 MCG capsule Take 0.5 mcg by mouth daily.  08/16/18   [provider]  carvedilol (COREG) 25 MG tablet Take 1 tablet (25 mg total) by mouth 2 (two) times daily with a meal. 10/01/19   [name], MD  Cholecalciferol (VITAMIN D-3) 125 MCG (5000 UT) TABS Take 5,000 Units by mouth daily.     [provider]  cyclobenzaprine (FLEXERIL) 10 MG tablet TAKE 1  TABLET (10 MG TOTAL) BY MOUTH AT BEDTIME. AS NEEDED FOR PAIN Patient taking differently: Take 10 mg by mouth at bedtime as needed for muscle spasms.  12/21/18   [name], MD  EPINEPHrine 0.3 mg/0.3 mL IJ SOAJ injection Inject 0.3 mLs into the muscle once as needed for anaphylaxis.  02/06/16   [provider]  fluticasone (FLONASE) 50 MCG/ACT nasal spray Place 1 spray into both nostrils daily. Patient taking differently: Place 1 spray into both nostrils daily as needed for allergies.  12/31/18   Volanda Napoleon, PA-C  hydrALAZINE (APRESOLINE) 10 MG tablet Take 2 tablets (20 mg total) by mouth 2 (two) times daily. 10/01/19   [name], MD  isosorbide mononitrate (IMDUR) 30 MG 24 hr tablet Take 1 tablet (30 mg total) by mouth daily. 10/01/19   [name], MD  loratadine (CLARITIN) 10 MG tablet Take 10 mg by mouth daily as needed for allergies.    [provider]  torsemide (DEMADEX) 20 MG tablet Take 2 tablets (40 mg total) by mouth daily. 10/01/19   [name], MD    Allergies:   Ace inhibitors, Influenza vaccines, Ketorolac, Lidocaine, Lisinopril, and Penicillins   Social History   Socioeconomic History   Marital status: Single    Spouse name: Not on file   Number of children: Not on file   Years of education: Not on file   Highest education level: Not on file  Occupational History   Not on file  Social Needs   Financial resource strain: Not on file   Food insecurity    Worry: Not on file    Inability: Not on file   Transportation needs    Medical: Not on file    Non-medical: Not on file  Tobacco Use   Smoking status: Never Smoker   Smokeless tobacco: Never Used  Substance and Sexual Activity   Alcohol use: No   Drug use: No   Sexual activity: Yes  Lifestyle   Physical activity    Days per week: Not on file    Minutes per session: Not on file   Stress: Not on file  Relationships   Social connections     Talks on phone: Not on file    Gets together: Not on file    Attends religious service: Not on file    Active member of club or organization: Not on file    Attends meetings of clubs or organizations: Not on file    Relationship status: Not on file  Other Topics Concern   Not on file  Social History Narrative   ** Merged History Encounter **         Family History:  The patient's family history includes Hypertension in his mother; Liver disease in his father.   ROS:   Please see the history of present illness.    ROS  All other systems reviewed and are negative.   PHYSICAL EXAM:   VS:  BP 130/72    Pulse 81    Ht 5\' 7"  (1.702 m)    Wt 278 lb (126.1 kg)    SpO2 98%    BMI 43.54 kg/m    GEN: Well nourished, well developed, in no acute distress  HEENT: normal  Neck: no JVD, carotid bruits, or masses Cardiac: RRR; no murmurs, rubs, or gallops,no edema  Respiratory:  clear to auscultation bilaterally, normal work of breathing GI: soft, nontender, nondistended, + BS MS: no deformity or atrophy  Skin: warm and dry, no rash Neuro:  Alert and Oriented x 3, Strength and sensation are intact Psych: euthymic mood, full affect  Wt Readings from Last 3 Encounters:  11/14/19 278 lb (126.1 kg)  10/01/19 276 lb 6.4 oz (125.4 kg)  07/23/19 292 lb 2 oz (132.5 kg)      Studies/Labs Reviewed:   EKG:  EKG is ordered today.  The ekg ordered today demonstrates NSR at rate of 81 bpm, chronic LBBB, no significant changes   Recent Labs: 07/13/2019: Magnesium 2.2 07/23/2019: ALT 20; B Natriuretic Peptide 697.5; BUN 62; Creatinine, Ser 7.56; Hemoglobin 10.0; Platelets 170; Potassium 3.7; Sodium 142   Lipid Panel    Component Value Date/Time   CHOL 182 03/05/2016 0503   TRIG 118 03/05/2016 0503   HDL 38 (L) 03/05/2016 0503   CHOLHDL 4.8 03/05/2016 0503   VLDL 24 03/05/2016 0503   LDLCALC 120 (H) 03/05/2016 0503    Additional studies/ records that were reviewed today include:    Echocardiogram: 09/2019   1. Left ventricular ejection fraction, by visual estimation, is 40 to 45%. The left ventricle has moderately decreased function. Moderately increased left ventricular size. There is no left ventricular hypertrophy.  2. Inferior akinesis.  3. Elevated left atrial and left ventricular end-diastolic pressures.  4. Left ventricular diastolic Doppler parameters are consistent with pseudonormalization pattern of LV diastolic filling.  5. Global right ventricle has normal systolic function.The right ventricular size is mildly enlarged. No increase in right ventricular wall thickness.  6. Left atrial size was normal.  7. Right atrial size was normal.  8. The mitral valve is abnormal. Mild mitral valve regurgitation.  9. The tricuspid valve is grossly normal. Tricuspid valve regurgitation is trivial. 10. The aortic valve is tricuspid Aortic valve regurgitation was not visualized by color flow Doppler. Mild aortic valve sclerosis without stenosis. 11. The pulmonic valve was grossly normal. Pulmonic valve regurgitation is trivial by color flow Doppler. 12. Normal pulmonary artery systolic pressure. 13. The inferior vena cava is normal in size with <50% respiratory variability, suggesting right atrial pressure of 8 mmHg. 14. A pacer wire is visualized.   ASSESSMENT & PLAN:    1. CAP - Has SOB for past few weeks. No covid symptoms or exposure. VQ scan without PE. CXR today showed "New hazy opacity in the right lower chest likely right middle lobe suspicious for developing pneumonia". Will defer Dr. [name] to further management. I have personally called PCP office and spoke with nurse regarding result. Patient will directly go to [facility].   2.  chronic systolic and diastolic CHF - Euvolemic. No CHF symptoms. Continue current therapy with Coreg, Imdur and Hydralazine.  -  echo 09/2019 showed improved LVEF to 40-45% and grade 2 DD.   3. CAD - No angina. Continue  ASA, statin and BB.     Medication Adjustments/Labs and Tests Ordered: Current  medicines are reviewed at length with the patient today.  Concerns regarding medicines are outlined above.  Medication changes, Labs and Tests ordered today are listed in the Patient Instructions below. Patient Instructions  Medication Instructions:  Your physician recommends that you continue on your current medications as directed. Please refer to the Current Medication list given to you today.  *If you need a refill on your cardiac medications before your next appointment, please call your pharmacy*  Lab Work: None   If you have labs (blood work) drawn today and your tests are completely normal, you will receive your results only by:  [place] (if you have MyChart) OR  A paper copy in the mail If you have any lab test that is abnormal or we need to change your treatment, we will call you to review the results.  Testing/Procedures: None   Follow-Up: At [facility], you and your health needs are our priority.  As part of our continuing mission to provide you with exceptional heart care, we have created designated Provider Care Teams.  These Care Teams include your primary Cardiologist (physician) and Advanced Practice Providers (APPs -  Physician Assistants and Nurse Practitioners) who all work together to provide you with the care you need, when you need it.  Your next appointment:   6 month(s)  The format for your next appointment:   In Person  Provider:   You may see Dr. Radford Pax or one of the following Advanced Practice Providers on your designated Care Team:    Melina Copa, PA-C  Ermalinda Barrios, PA-C   Other Instructions None      Signed, Leanor Kail, [place]  11/14/2019 3:14 PM    [facility] [address], [place], [place]  [address] Phone: [phone]; Fax: [phone]

## 2019-11-14 NOTE — Progress Notes (Signed)
See recent CV note today and CXR.  Reasonable to try ABX- azithromycin  Reviewed medications

## 2019-11-14 NOTE — Patient Instructions (Signed)
Medication Instructions:  Your physician recommends that you continue on your current medications as directed. Please refer to the Current Medication list given to you today.  *If you need a refill on your cardiac medications before your next appointment, please call your pharmacy*  Lab Work: None   If you have labs (blood work) drawn today and your tests are completely normal, you will receive your results only by: [facility] Kitchen MyChart Message (if you have MyChart) OR . A paper copy in the mail If you have any lab test that is abnormal or we need to change your treatment, we will call you to review the results.  Testing/Procedures: None   Follow-Up: At [facility], you and your health needs are our priority.  As part of our continuing mission to provide you with exceptional heart care, we have created designated Provider Care Teams.  These Care Teams include your primary Cardiologist (physician) and Advanced Practice Providers (APPs -  Physician Assistants and Nurse Practitioners) who all work together to provide you with the care you need, when you need it.  Your next appointment:   6 month(s)  The format for your next appointment:   In Person  Provider:   You may see Dr. Radford Pax or one of the following Advanced Practice Providers on your designated Care Team:    Melina Copa, PA-C  Ermalinda Barrios, PA-C   Other Instructions None

## 2019-11-14 NOTE — Telephone Encounter (Signed)
Radiology called to see if PCP has reviewed chest xray results.

## 2019-11-14 NOTE — Telephone Encounter (Signed)
PC placed to pt this evening.   I informed pt that I received the results of CXR and perfusion test that he had done.  Perfusion test did not suggest any P.E.  CXR per radiologist showed some haziness in RLL to suggest early pneumonia. Pt denies any fever. Reports he has had a faint cough with wheezing when he coughs. No sick contacts, no known COVID exposure, no loss of taste or smell.  Dr. Judd Gaudier had called in [place] for him to cover for CAP.  However, given pt's comorbid conditions, he needs broader cover.  Not able to add Augmentin due to PCN allergy.  Therefore, will have him stop Zithromax and prescribe Avelox instead.  No adjustment in dose needed based on kidney function per UpToDate. I also recommend that he have COVID testing done.  Pt does not want to go to our [facility]. States he prefers to go to [place] to have it done and he will go tomorrow. Plan to keep him out of work for 1 wk while completing abx.  Advise pt to please notify me if COVID test is positive as he will need to be out of work longer.  He expressed understanding.

## 2019-11-15 ENCOUNTER — Other Ambulatory Visit: Payer: Self-pay

## 2019-11-15 DIAGNOSIS — Z20828 Contact with and (suspected) exposure to other viral communicable diseases: Secondary | ICD-10-CM

## 2019-11-15 DIAGNOSIS — Z20822 Contact with and (suspected) exposure to covid-19: Secondary | ICD-10-CM

## 2019-11-15 NOTE — Telephone Encounter (Signed)
Contacted pt and made aware that letter is ready for pickup 

## 2019-11-16 MED FILL — CALCITRIOL CAP 0.5 MCG: 0.5 | 30 days supply | Qty: 30 | Fill #1

## 2019-11-17 LAB — NOVEL CORONAVIRUS, NAA: SARS-CoV-2, NAA: NOT DETECTED

## 2019-11-19 ENCOUNTER — Telehealth: Payer: Self-pay

## 2019-11-19 NOTE — Telephone Encounter (Signed)
Contacted pt to go over lab results pt is aware and doesn't have any questions or concerns 

## 2019-11-19 NOTE — Progress Notes (Signed)
Subjective:    Patient ID: Victor Thompson, male    DOB: 05-20-1961, 58 y.o.   MRN: [id number]  58 y.o.M f/u for breathing issues  Covid was NEG  11/20/2019 This is a follow-up visit for patient who was diagnosed with pneumonia right lower lobe over a week ago and he had associated with this shortness of breath and cough.  The patient also had requested a nebulizer machine because he had pre-existing issues and was also on the Symbicort 2 puffs twice daily was unclear of the was improving his situation.  He states that he sleeps only on 2 pillows but at times cannot lay flat.  His shortness of breath is worse with exertion.  He was given moxifloxacin for 5 days a week ago with some mprovement.  He does have stage V CKD with creatinine over 7 and is followed by nephrology.  The patient also has history of systolic and diastolic heart failure along with coronary artery disease.  The patient has pending pulmonary function studies.  This patient also was tested for Covid and was negative.  Blood pressure because of end-stage nature kidney disease has been difficult to control.  Note the patient is a non-smoker.  He does have sleep apnea and is on a CPAP machine he has had asthma in the past as well.  He denies any sinus complaints at this time.  He did have a nuclear medicine study which showed no evidence of pulmonary emboli.  He also has a pending repeat cardiology visit. Shortness of Breath This is a new problem. The current episode started 1 to 4 weeks ago. The problem occurs daily. The problem has been unchanged. Associated symptoms include headaches, leg swelling, orthopnea and wheezing. Pertinent negatives include no abdominal pain, chest pain, claudication, ear pain, fever, hemoptysis, leg pain, PND, rash, rhinorrhea, sore throat or sputum production. The symptoms are aggravated by any activity and lying flat. He has tried beta agonist inhalers and steroid inhalers for the symptoms. The treatment  provided mild relief. His past medical history is significant for asthma, CAD, COPD, a heart failure and pneumonia.    Past Medical History:  Diagnosis Date  . AICD (automatic cardioverter/defibrillator) present    2015  . Asthma    no meds  . Cardiac arrest ([place]) 2015  . CHF (congestive heart failure) ([place])   . Chronic kidney disease    ckd -stage 5  . Coronary artery disease   . Hypertension   . Myocardial infarction ([place])   . Obesity   . Pneumonia   . Pulmonary edema   . Shortness of breath dyspnea   . Sleep apnea    USES CPAP  . Wears glasses      Family History  Problem Relation Age of Onset  . Hypertension Mother   . Liver disease Father      Social History   Socioeconomic History  . Marital status: Single    Spouse name: Not on file  . Number of children: Not on file  . Years of education: Not on file  . Highest education level: Not on file  Occupational History  . Not on file  Tobacco Use  . Smoking status: Never Smoker  . Smokeless tobacco: Never Used  Substance and Sexual Activity  . Alcohol use: No  . Drug use: No  . Sexual activity: Yes  Other Topics Concern  . Not on file  Social History Narrative   ** Merged History Encounter **  Social Determinants of Health   Financial Resource Strain:   . Difficulty of Paying Living Expenses: Not on file  Food Insecurity:   . Worried About Charity fundraiser in the Last Year: Not on file  . Ran Out of Food in the Last Year: Not on file  Transportation Needs:   . Lack of Transportation (Medical): Not on file  . Lack of Transportation (Non-Medical): Not on file  Physical Activity:   . Days of Exercise per Week: Not on file  . Minutes of Exercise per Session: Not on file  Stress:   . Feeling of Stress : Not on file  Social Connections:   . Frequency of Communication with Friends and Family: Not on file  . Frequency of Social Gatherings with Friends and Family: Not on file  . Attends Religious  Services: Not on file  . Active Member of Clubs or Organizations: Not on file  . Attends Archivist Meetings: Not on file  . Marital Status: Not on file  Intimate Partner Violence:   . Fear of Current or Ex-Partner: Not on file  . Emotionally Abused: Not on file  . Physically Abused: Not on file  . Sexually Abused: Not on file     Allergies  Allergen Reactions  . Ace Inhibitors Swelling    Swelling of the tongue  . Influenza Vaccines Hives and Swelling    SWELLING REACTION UNSPECIFIED   . Ketorolac Swelling    SWELLING REACTION UNSPECIFIED   . Lidocaine Swelling    TONGUE SWELLS  . Lisinopril Swelling    TONGUE SWELLING Pt reported problem with a BP med which sounded like  lisinopril  But as of 09/19/06,pt had tolerated altace without problem  . Penicillins Swelling    TONGUE SWELLS Has patient had a PCN reaction causing immediate rash, facial/tongue/throat swelling, SOB or lightheadedness with hypotension: Yes Has patient had a PCN reaction causing severe rash involving mucus membranes or skin necrosis: No Has patient had a PCN reaction that required hospitalization No Has patient had a PCN reaction occurring within the last 10 years: Yes If all of the above answers are "NO", then may proceed with Cephalosporin use.      Outpatient Medications Prior to Visit  Medication Sig Dispense Refill  . albuterol (PROVENTIL) (2.5 MG/3ML) 0.083% nebulizer solution Take 3 mLs (2.5 mg total) by nebulization every 6 (six) hours as needed for wheezing or shortness of breath. 75 mL 12  . albuterol (VENTOLIN HFA) 108 (90 Base) MCG/ACT inhaler Inhale 2 puffs into the lungs every 4 (four) hours as needed for wheezing or shortness of breath. 8 g 6  . amLODipine (NORVASC) 10 MG tablet Take 1 tablet (10 mg total) by mouth daily. 90 tablet 1  . aspirin EC 81 MG tablet Take 1 tablet (81 mg total) by mouth daily. 30 tablet 5  . atorvastatin (LIPITOR) 40 MG tablet Take 1 tablet (40 mg total)  by mouth at bedtime. 90 tablet 1  . budesonide-formoterol (SYMBICORT) 80-4.5 MCG/ACT inhaler Inhale 2 puffs into the lungs 2 (two) times daily. 1 Inhaler 6  . calcitRIOL (ROCALTROL) 0.5 MCG capsule Take 0.5 mcg by mouth daily.   11  . carvedilol (COREG) 25 MG tablet Take 1 tablet (25 mg total) by mouth 2 (two) times daily with a meal. 180 tablet 1  . Cholecalciferol (VITAMIN D-3) 125 MCG (5000 UT) TABS Take 5,000 Units by mouth daily.     . cyclobenzaprine (FLEXERIL) 10 MG tablet  TAKE 1 TABLET (10 MG TOTAL) BY MOUTH AT BEDTIME. AS NEEDED FOR PAIN 30 tablet 0  . EPINEPHrine 0.3 mg/0.3 mL IJ SOAJ injection Inject 0.3 mLs into the muscle once as needed for anaphylaxis.     . fluticasone (FLONASE) 50 MCG/ACT nasal spray Place 1 spray into both nostrils daily. 16 g 2  . hydrALAZINE (APRESOLINE) 10 MG tablet Take 2 tablets (20 mg total) by mouth 2 (two) times daily. 120 tablet 6  . isosorbide mononitrate (IMDUR) 30 MG 24 hr tablet Take 1 tablet (30 mg total) by mouth daily. 90 tablet 1  . loratadine (CLARITIN) 10 MG tablet Take 10 mg by mouth daily as needed for allergies.    Marland Kitchen torsemide (DEMADEX) 20 MG tablet Take 2 tablets (40 mg total) by mouth daily. 180 tablet 1  . azithromycin (ZITHROMAX) 250 MG tablet 2 by mouth today, then 1 daily for 4 days (Patient not taking: Reported on 11/20/2019) 6 tablet 0  . moxifloxacin (AVELOX) 400 MG tablet Take 1 tablet (400 mg total) by mouth daily. (Patient not taking: Reported on 11/20/2019) 7 tablet 0   No facility-administered medications prior to visit.     Review of Systems  Constitutional: Negative for fever.  HENT: Negative for ear pain, rhinorrhea and sore throat.   Respiratory: Positive for shortness of breath and wheezing. Negative for hemoptysis and sputum production.   Cardiovascular: Positive for orthopnea and leg swelling. Negative for chest pain, claudication and PND.  Gastrointestinal: Negative for abdominal pain.  Skin: Negative for rash.    Neurological: Positive for headaches.       Objective:   Physical Exam Vitals:   11/20/19 1512  BP: (!) 160/94  Pulse: 92  Resp: 16  SpO2: 96%  Weight: 278 lb (126.1 kg)    Gen: Pleasant, obese, in no distress,  normal affect  ENT: No lesions,  mouth clear,  oropharynx clear, no postnasal drip  Neck: No JVD, no TMG, no carotid bruits  Lungs: No use of accessory muscles, no dullness to percussion, distant breath sounds   cardiovascular: RRR, heart sounds normal, no murmur or gallops, trace peripheral edema in ankles Abdomen: soft and NT, no HSM,  BS normal  Musculoskeletal: No deformities, no cyanosis or clubbing  Neuro: alert, non focal  Skin: Warm, no lesions or rashes  Chest x-ray is reviewed and shows right lower lobe infiltrate  Nuclear medicine studies reviewed and showed normal perfusion     Assessment & Plan:  I personally reviewed all images and lab data in the [facility] system as well as any outside material available during this office visit and agree with the  radiology impressions.   Community acquired pneumonia of right lower lobe of lung The patient does have right lower lobe infiltrate on chest x-ray and I am concerned may have persisting pneumonia at this time  Plan for this patient be another 5-day course of Levaquin 500 mg daily    COPD exacerbation ([place]) This patient has a prior history of tobacco use but he is now no longer smoking   I agree with continuing Symbicort on a scheduled basis and albuterol either by handheld or nebulizer as needed  I agree with getting pulmonary function studies and will follow up on this  Obstructive sleep apnea Sleep apnea appears to be stable at this time will maintain CPAP as currently prescribed  Tobacco use disorder in remission Unconfirmed this patient has quit smoking in 2010   Quindarrius was seen today for  follow-up.  Diagnoses and all orders for this visit:  Community acquired pneumonia of right lower  lobe of lung  COPD exacerbation ([place])  Obstructive sleep apnea  Tobacco use disorder in remission  Other orders -     levofloxacin (LEVAQUIN) 500 MG tablet; Take 1 tablet (500 mg total) by mouth daily.

## 2019-11-20 ENCOUNTER — Other Ambulatory Visit: Payer: Self-pay

## 2019-11-20 ENCOUNTER — Encounter: Payer: Self-pay | Admitting: Critical Care Medicine

## 2019-11-20 ENCOUNTER — Ambulatory Visit: Payer: BC Managed Care – PPO | Attending: Critical Care Medicine | Admitting: Critical Care Medicine

## 2019-11-20 VITALS — BP 160/94 | HR 92 | Resp 16 | Wt 278.0 lb

## 2019-11-20 DIAGNOSIS — G4733 Obstructive sleep apnea (adult) (pediatric): Secondary | ICD-10-CM

## 2019-11-20 DIAGNOSIS — J189 Pneumonia, unspecified organism: Secondary | ICD-10-CM | POA: Diagnosis not present

## 2019-11-20 DIAGNOSIS — F17200 Nicotine dependence, unspecified, uncomplicated: Secondary | ICD-10-CM | POA: Diagnosis not present

## 2019-11-20 DIAGNOSIS — J441 Chronic obstructive pulmonary disease with (acute) exacerbation: Secondary | ICD-10-CM

## 2019-11-20 MED ORDER — LEVOFLOXACIN 500 MG PO TABS: 500.0000 mg | ORAL_TABLET | Freq: Every day | ORAL | 0 refills | Status: DC

## 2019-11-20 NOTE — Patient Instructions (Signed)
Take Levaquin 1 daily for 5 days this was sent to your [facility]  No other change in medications at this time  Stay on the Symbicort inhaler 2 puffs twice daily  We will see about getting the lung function test that was previously ordered performed

## 2019-11-20 NOTE — Assessment & Plan Note (Signed)
Sleep apnea appears to be stable at this time will maintain CPAP as currently prescribed

## 2019-11-20 NOTE — Assessment & Plan Note (Addendum)
This patient has a prior history of tobacco use but he is now no longer smoking   I agree with continuing Symbicort on a scheduled basis and albuterol either by handheld or nebulizer as needed  I agree with getting pulmonary function studies and will follow up on this

## 2019-11-20 NOTE — Assessment & Plan Note (Signed)
Unconfirmed this patient has quit smoking in 2010

## 2019-11-20 NOTE — Assessment & Plan Note (Signed)
The patient does have right lower lobe infiltrate on chest x-ray and I am concerned may have persisting pneumonia at this time  Plan for this patient be another 5-day course of Levaquin 500 mg daily

## 2019-11-26 ENCOUNTER — Emergency Department (HOSPITAL_COMMUNITY): Payer: BC Managed Care – PPO

## 2019-11-26 ENCOUNTER — Encounter (HOSPITAL_COMMUNITY): Payer: Self-pay | Admitting: Emergency Medicine

## 2019-11-26 ENCOUNTER — Emergency Department (HOSPITAL_COMMUNITY)
Admission: EM | Admit: 2019-11-26 | Discharge: 2019-11-26 | Disposition: A | Discharge status: Home / Self Care | Payer: BC Managed Care – PPO | Attending: Emergency Medicine | Admitting: Emergency Medicine

## 2019-11-26 ENCOUNTER — Other Ambulatory Visit: Payer: Self-pay

## 2019-11-26 DIAGNOSIS — R05 Cough: Secondary | ICD-10-CM | POA: Insufficient documentation

## 2019-11-26 DIAGNOSIS — R0602 Shortness of breath: Secondary | ICD-10-CM

## 2019-11-26 DIAGNOSIS — I2510 Atherosclerotic heart disease of native coronary artery without angina pectoris: Secondary | ICD-10-CM | POA: Diagnosis not present

## 2019-11-26 DIAGNOSIS — Z79899 Other long term (current) drug therapy: Secondary | ICD-10-CM | POA: Insufficient documentation

## 2019-11-26 DIAGNOSIS — R059 Cough, unspecified: Secondary | ICD-10-CM

## 2019-11-26 DIAGNOSIS — J45909 Unspecified asthma, uncomplicated: Secondary | ICD-10-CM | POA: Diagnosis not present

## 2019-11-26 DIAGNOSIS — Z7982 Long term (current) use of aspirin: Secondary | ICD-10-CM | POA: Insufficient documentation

## 2019-11-26 DIAGNOSIS — N185 Chronic kidney disease, stage 5: Secondary | ICD-10-CM | POA: Diagnosis not present

## 2019-11-26 DIAGNOSIS — I5042 Chronic combined systolic (congestive) and diastolic (congestive) heart failure: Secondary | ICD-10-CM | POA: Insufficient documentation

## 2019-11-26 DIAGNOSIS — I132 Hypertensive heart and chronic kidney disease with heart failure and with stage 5 chronic kidney disease, or end stage renal disease: Secondary | ICD-10-CM | POA: Diagnosis not present

## 2019-11-26 DIAGNOSIS — I252 Old myocardial infarction: Secondary | ICD-10-CM | POA: Diagnosis not present

## 2019-11-26 LAB — COMPREHENSIVE METABOLIC PANEL
ALT: 15 U/L (ref 0–44)
AST: 17 U/L (ref 15–41)
Albumin: 3.4 g/dL — ABNORMAL LOW (ref 3.5–5.0)
Alkaline Phosphatase: 74 U/L (ref 38–126)
Anion gap: 11 (ref 5–15)
BUN: 54 mg/dL — ABNORMAL HIGH (ref 6–20)
CO2: 20 mmol/L — ABNORMAL LOW (ref 22–32)
Calcium: 8.7 mg/dL — ABNORMAL LOW (ref 8.9–10.3)
Chloride: 111 mmol/L (ref 98–111)
Creatinine, Ser: 7.4 mg/dL — ABNORMAL HIGH (ref 0.61–1.24)
GFR calc Af Amer: 9 mL/min — ABNORMAL LOW (ref >60)
GFR calc non Af Amer: 7 mL/min — ABNORMAL LOW (ref >60)
Glucose, Bld: 150 mg/dL — ABNORMAL HIGH (ref 70–99)
Potassium: 3.7 mmol/L (ref 3.5–5.1)
Sodium: 142 mmol/L (ref 135–145)
Total Bilirubin: 1.1 mg/dL (ref 0.3–1.2)
Total Protein: 6.8 g/dL (ref 6.5–8.1)

## 2019-11-26 LAB — CBC WITH DIFFERENTIAL/PLATELET
Abs Immature Granulocytes: 0.02 10*3/uL (ref 0.00–0.07)
Basophils Absolute: 0 10*3/uL (ref 0.0–0.1)
Basophils Relative: 1 %
Eosinophils Absolute: 0.5 10*3/uL (ref 0.0–0.5)
Eosinophils Relative: 8 %
HCT: 34.6 % — ABNORMAL LOW (ref 39.0–52.0)
Hemoglobin: 11.1 g/dL — ABNORMAL LOW (ref 13.0–17.0)
Immature Granulocytes: 0 %
Lymphocytes Relative: 10 %
Lymphs Abs: 0.6 10*3/uL — ABNORMAL LOW (ref 0.7–4.0)
MCH: 29.2 pg (ref 26.0–34.0)
MCHC: 32.1 g/dL (ref 30.0–36.0)
MCV: 91.1 fL (ref 80.0–100.0)
Monocytes Absolute: 0.6 10*3/uL (ref 0.1–1.0)
Monocytes Relative: 10 %
Neutro Abs: 4.5 10*3/uL (ref 1.7–7.7)
Neutrophils Relative %: 71 %
Platelets: 197 10*3/uL (ref 150–400)
RBC: 3.8 MIL/uL — ABNORMAL LOW (ref 4.22–5.81)
RDW: 16.6 % — ABNORMAL HIGH (ref 11.5–15.5)
WBC: 6.2 10*3/uL (ref 4.0–10.5)
nRBC: 0 % (ref 0.0–0.2)

## 2019-11-26 LAB — TROPONIN I (HIGH SENSITIVITY): Troponin I (High Sensitivity): 45 ng/L — ABNORMAL HIGH (ref <18)

## 2019-11-26 LAB — BRAIN NATRIURETIC PEPTIDE: B Natriuretic Peptide: 680.6 pg/mL — ABNORMAL HIGH (ref 0.0–100.0)

## 2019-11-26 MED ORDER — DEXAMETHASONE 4 MG PO TABS
10.0000 mg | ORAL_TABLET | Freq: Once | ORAL | Status: AC
  Administered 2019-11-26: 10 mg via ORAL
  Filled 2019-11-26: qty 3

## 2019-11-26 NOTE — ED Provider Notes (Signed)
[facility] EMERGENCY DEPARTMENT Provider Note   CSN: [id number] Arrival date & time: 11/26/19  6256     History Chief Complaint  Patient presents with  . Shortness of Breath    Victor Thompson is a 58 y.o. male.  The history is provided by the patient and medical records. No language interpreter was used.  Shortness of Breath  Victor Thompson is a 58 y.o. male who presents to the Emergency Department complaining of sob.  He reports that he became ill in august of this year with increased sob.  He has been seen multiple times.  Two weeks ago he experienced worsening sxs and was treated with abx and had minimal improvement.  One week ago he received 5 days of levaquin and his sob has mildly improved but he has experienced rattling cough (in his chest) for the last two days.  He has DOE but this is overall improving.  He has orthopnea, which is not improving.  Denies fevers, chest pain, productive cough, edema, V/D. Sxs are moderate, constant.      Past Medical History:  Diagnosis Date  . AICD (automatic cardioverter/defibrillator) present    2015  . Asthma    no meds  . Cardiac arrest ([place]) 2015  . CHF (congestive heart failure) ([place])   . Chronic kidney disease    ckd -stage 5  . Coronary artery disease   . Hypertension   . Myocardial infarction ([place])   . Obesity   . Pneumonia   . Pulmonary edema   . Shortness of breath dyspnea   . Sleep apnea    USES CPAP  . Wears glasses     Patient Active Problem List   Diagnosis Date Noted  . Community acquired pneumonia of right lower lobe of lung 11/20/2019  . Secondary hyperparathyroidism of renal origin ([place]) 07/15/2019  . Vitamin D deficiency 07/15/2019  . CAD (coronary artery disease), native coronary artery 04/10/2019  . CKD (chronic kidney disease) stage 5, GFR less than 15 ml/min ([facility]) 09/26/2018  . Chronic midline low back pain without sciatica 02/17/2018  . COPD exacerbation ([place]) 03/04/2016  .  Defibrillator discharge   . Chronic combined systolic and diastolic CHF (congestive heart failure) ([place]) 01/22/2016  . Erectile dysfunction 01/22/2016  . Allergic rhinitis 01/22/2016  . GERD (gastroesophageal reflux disease) 01/22/2016  . PROTEINURIA 02/17/2010  . Cardiomyopathy, ischemic 02/16/2010  . Obstructive sleep apnea 02/16/2010  . EXTERNAL HEMORRHOIDS 09/26/2009  . Tobacco use disorder in remission 06/03/2009  . Essential hypertension 10/01/2008  . Hyperlipidemia 07/14/2007  . OBESITY 07/14/2007  . Asthma 07/14/2007    Past Surgical History:  Procedure Laterality Date  . AV FISTULA PLACEMENT Right 03/15/2019   Procedure: RIGHT ARM ARTERIOVENOUS (AV) FISTULA CREATION;  Surgeon: Angelia Mould, MD;  Location: [facility];  Service: Vascular;  Laterality: Right;  . COLONOSCOPY    . COLONOSCOPY W/ BIOPSIES AND POLYPECTOMY    . CORONARY STENT PLACEMENT  2007  . IMPLANTABLE CARDIOVERTER DEFIBRILLATOR IMPLANT  2015       Family History  Problem Relation Age of Onset  . Hypertension Mother   . Liver disease Father     Social History   Tobacco Use  . Smoking status: Never Smoker  . Smokeless tobacco: Never Used  Substance Use Topics  . Alcohol use: No  . Drug use: No    Home Medications Prior to Admission medications   Medication Sig Start Date End Date Taking? Authorizing Provider  albuterol (PROVENTIL) (  2.5 MG/3ML) 0.083% nebulizer solution Take 3 mLs (2.5 mg total) by nebulization every 6 (six) hours as needed for wheezing or shortness of breath. 10/01/19   [name], MD  albuterol (VENTOLIN HFA) 108 (90 Base) MCG/ACT inhaler Inhale 2 puffs into the lungs every 4 (four) hours as needed for wheezing or shortness of breath. 10/01/19   [name], MD  amLODipine (NORVASC) 10 MG tablet Take 1 tablet (10 mg total) by mouth daily. 06/28/19   [name], PA-C  aspirin EC 81 MG tablet Take 1 tablet (81 mg total) by mouth daily. 01/22/16   [name],  [name], MD  atorvastatin (LIPITOR) 40 MG tablet Take 1 tablet (40 mg total) by mouth at bedtime. 06/28/19   [name], PA-C  budesonide-formoterol (SYMBICORT) 80-4.5 MCG/ACT inhaler Inhale 2 puffs into the lungs 2 (two) times daily. 10/01/19   [name], MD  calcitRIOL (ROCALTROL) 0.5 MCG capsule Take 0.5 mcg by mouth daily.  08/16/18   [provider]  carvedilol (COREG) 25 MG tablet Take 1 tablet (25 mg total) by mouth 2 (two) times daily with a meal. 10/01/19   [name], MD  Cholecalciferol (VITAMIN D-3) 125 MCG (5000 UT) TABS Take 5,000 Units by mouth daily.     [provider]  cyclobenzaprine (FLEXERIL) 10 MG tablet TAKE 1 TABLET (10 MG TOTAL) BY MOUTH AT BEDTIME. AS NEEDED FOR PAIN 12/21/18   [name], MD  EPINEPHrine 0.3 mg/0.3 mL IJ SOAJ injection Inject 0.3 mLs into the muscle once as needed for anaphylaxis.  02/06/16   [provider]  fluticasone (FLONASE) 50 MCG/ACT nasal spray Place 1 spray into both nostrils daily. 12/31/18   Volanda Napoleon, PA-C  hydrALAZINE (APRESOLINE) 10 MG tablet Take 2 tablets (20 mg total) by mouth 2 (two) times daily. 10/01/19   [name], MD  isosorbide mononitrate (IMDUR) 30 MG 24 hr tablet Take 1 tablet (30 mg total) by mouth daily. 10/01/19   [name], MD  levofloxacin (LEVAQUIN) 500 MG tablet Take 1 tablet (500 mg total) by mouth daily. 11/20/19   [name], MD  loratadine (CLARITIN) 10 MG tablet Take 10 mg by mouth daily as needed for allergies.    [provider]  torsemide (DEMADEX) 20 MG tablet Take 2 tablets (40 mg total) by mouth daily. 10/01/19   [name], MD    Allergies    Ace inhibitors, Influenza vaccines, Ketorolac, Lidocaine, Lisinopril, and Penicillins  Review of Systems   Review of Systems  Respiratory: Positive for shortness of breath.   All other systems reviewed and are negative.   Physical Exam Updated Vital Signs BP  139/80   Pulse 77   Temp 98.1 F (36.7 C) (Oral)   Resp 13   SpO2 94%   Physical Exam Vitals and nursing note reviewed.  Constitutional:      Appearance: He is well-developed.  HENT:     Head: Normocephalic and atraumatic.  Cardiovascular:     Rate and Rhythm: Normal rate and regular rhythm.     Heart sounds: No murmur.  Pulmonary:     Effort: Pulmonary effort is normal. No respiratory distress.     Breath sounds: Normal breath sounds.  Abdominal:     Palpations: Abdomen is soft.     Tenderness: There is no abdominal tenderness. There is no guarding or rebound.  Musculoskeletal:     Comments: Trace edema to BLE  Skin:  General: Skin is warm and dry.  Neurological:     Mental Status: He is alert and oriented to person, place, and time.  Psychiatric:        Behavior: Behavior normal.     ED Results / Procedures / Treatments   Labs (all labs ordered are listed, but only abnormal results are displayed) Labs Reviewed  COMPREHENSIVE METABOLIC PANEL - Abnormal; Notable for the following components:      Result Value   CO2 20 (*)    Glucose, Bld 150 (*)    BUN 54 (*)    Creatinine, Ser 7.40 (*)    Calcium 8.7 (*)    Albumin 3.4 (*)    GFR calc non Af Amer 7 (*)    GFR calc Af Amer 9 (*)    All other components within normal limits  BRAIN NATRIURETIC PEPTIDE - Abnormal; Notable for the following components:   B Natriuretic Peptide 680.6 (*)    All other components within normal limits  CBC WITH DIFFERENTIAL/PLATELET - Abnormal; Notable for the following components:   RBC 3.80 (*)    Hemoglobin 11.1 (*)    HCT 34.6 (*)    RDW 16.6 (*)    Lymphs Abs 0.6 (*)    All other components within normal limits  TROPONIN I (HIGH SENSITIVITY) - Abnormal; Notable for the following components:   Troponin I (High Sensitivity) 45 (*)    All other components within normal limits    EKG EKG Interpretation  Date/Time:  Monday November 26 2019 09:23:18 EST Ventricular Rate:   82 PR Interval:  176 QRS Duration: 144 QT Interval:  438 QTC Calculation: 511 R Axis:   -60 Text Interpretation: Sinus rhythm with Premature supraventricular complexes and with occasional Premature ventricular complexes Left bundle branch block Abnormal ECG Confirmed by Quintella Reichert [id number]) on 11/26/2019 9:27:28 AM   Radiology DG Chest 2 View  Result Date: 11/26/2019 CLINICAL DATA:  Cough for over 1 week. EXAM: CHEST - 2 VIEW COMPARISON:  PA and lateral chest 08/23/2018, 11/14/2019 and 06/13/2019. FINDINGS: Defibrillator is unchanged. Lungs are clear. Heart size is enlarged. No pneumothorax or pleural effusion. No acute or focal bony abnormality. IMPRESSION: No acute disease. Electronically Signed   By: Inge Rise M.D.   On: 11/26/2019 10:26    Procedures Procedures (including critical care time)  Medications Ordered in ED Medications  dexamethasone (DECADRON) tablet 10 mg (10 mg Oral Given 11/26/19 1153)    ED Course  I have reviewed the triage vital signs and the nursing notes.  Pertinent labs & imaging results that were available during my care of the patient were reviewed by me and considered in my medical decision making (see chart for details).    MDM Rules/Calculators/A&P                      Patient here for evaluation of changing cough. He has chronic shortness of breath and overall this is been improving over the last two weeks with the initiation of antibiotics. He is non-toxic appearing on evaluation and in no respiratory distress. Chest x-ray without acute infiltrate. Labs demonstrate stable renal insufficiency. Mild elevation in troponin and BNP, similar when compared to priors. Discussed with patient unclear source of symptoms. Presentation is not consistent with PE, ACS, acute CHF. Patient is requesting round of steroids is assessed helped him in the past. Will provide one-time dose of Decadron. Discussed importance of outpatient follow-up and return  precautions.  Victor Thompson was evaluated in Emergency Department on 11/26/2019 for the symptoms described in the history of present illness. He was evaluated in the context of the global COVID-19 pandemic, which necessitated consideration that the patient might be at risk for infection with the SARS-CoV-2 virus that causes COVID-19. Institutional protocols and algorithms that pertain to the evaluation of patients at risk for COVID-19 are in a state of rapid change based on information released by regulatory bodies including the CDC and federal and state organizations. These policies and algorithms were followed during the patient's care in the ED.  Final Clinical Impression(s) / ED Diagnoses Final diagnoses:  Cough  SOB (shortness of breath)    Rx / DC Orders ED Discharge Orders    None       Quintella Reichert, MD 11/26/19 1540

## 2019-11-26 NOTE — ED Notes (Signed)
Got patient on the monitor patient is resting with call bell in reach  ?

## 2019-11-26 NOTE — ED Notes (Signed)
Patient Alert and oriented to baseline. Stable and ambulatory to baseline. Patient verbalized understanding of the discharge instructions.  Patient belongings were taken by the patient.   

## 2019-11-26 NOTE — ED Triage Notes (Signed)
Pt states aw dr Joya Gaskins on the 12/15 and given rx for levoquin for cough states started it but sat felt like his chest was rattling more and   He has a cough  still

## 2019-12-11 ENCOUNTER — Emergency Department (HOSPITAL_COMMUNITY)
Admission: EM | Admit: 2019-12-11 | Discharge: 2019-12-11 | Disposition: A | Discharge status: Home / Self Care | Payer: BC Managed Care – PPO | Attending: Emergency Medicine | Admitting: Emergency Medicine

## 2019-12-11 ENCOUNTER — Encounter (HOSPITAL_COMMUNITY): Payer: Self-pay | Admitting: *Deleted

## 2019-12-11 ENCOUNTER — Emergency Department (HOSPITAL_COMMUNITY): Payer: BC Managed Care – PPO

## 2019-12-11 ENCOUNTER — Other Ambulatory Visit: Payer: Self-pay

## 2019-12-11 DIAGNOSIS — J189 Pneumonia, unspecified organism: Secondary | ICD-10-CM

## 2019-12-11 DIAGNOSIS — R0602 Shortness of breath: Secondary | ICD-10-CM | POA: Diagnosis present

## 2019-12-11 LAB — BASIC METABOLIC PANEL
Anion gap: 14 (ref 5–15)
BUN: 67 mg/dL — ABNORMAL HIGH (ref 6–20)
CO2: 19 mmol/L — ABNORMAL LOW (ref 22–32)
Calcium: 8.6 mg/dL — ABNORMAL LOW (ref 8.9–10.3)
Chloride: 109 mmol/L (ref 98–111)
Creatinine, Ser: 7.13 mg/dL — ABNORMAL HIGH (ref 0.61–1.24)
GFR calc Af Amer: 9 mL/min — ABNORMAL LOW (ref >60)
GFR calc non Af Amer: 8 mL/min — ABNORMAL LOW (ref >60)
Glucose, Bld: 125 mg/dL — ABNORMAL HIGH (ref 70–99)
Potassium: 3.8 mmol/L (ref 3.5–5.1)
Sodium: 142 mmol/L (ref 135–145)

## 2019-12-11 LAB — CBC
HCT: 35 % — ABNORMAL LOW (ref 39.0–52.0)
Hemoglobin: 11 g/dL — ABNORMAL LOW (ref 13.0–17.0)
MCH: 28.9 pg (ref 26.0–34.0)
MCHC: 31.4 g/dL (ref 30.0–36.0)
MCV: 92.1 fL (ref 80.0–100.0)
Platelets: 208 10*3/uL (ref 150–400)
RBC: 3.8 MIL/uL — ABNORMAL LOW (ref 4.22–5.81)
RDW: 16.9 % — ABNORMAL HIGH (ref 11.5–15.5)
WBC: 6.1 10*3/uL (ref 4.0–10.5)
nRBC: 0 % (ref 0.0–0.2)

## 2019-12-11 LAB — TROPONIN I (HIGH SENSITIVITY): Troponin I (High Sensitivity): 47 ng/L — ABNORMAL HIGH (ref <18)

## 2019-12-11 MED ORDER — SODIUM CHLORIDE 0.9% FLUSH: 3.0000 mL | Freq: Once | INTRAVENOUS | Status: DC

## 2019-12-11 MED ORDER — LEVOFLOXACIN 750 MG PO TABS: 750.0000 mg | ORAL_TABLET | Freq: Every day | ORAL | 0 refills | Status: AC

## 2019-12-11 MED FILL — ATORVASTATIN CALCIUM 40 MG: 40 | 90 days supply | Qty: 90 | Fill #1

## 2019-12-11 MED FILL — ISOSORBIDE MN ER 30 MG TAB: 30 | 30 days supply | Qty: 30 | Fill #4

## 2019-12-11 MED FILL — TORSEMIDE 20 MG TABLET: 20 | 30 days supply | Qty: 60 | Fill #8

## 2019-12-11 NOTE — ED Notes (Signed)
Discharge instructions and prescription discussed with patient. Pt verbalizes understanding. Pt alert and oriented. Pt ambulatory on discharge.

## 2019-12-11 NOTE — ED Provider Notes (Signed)
[facility] EMERGENCY DEPARTMENT Provider Note   CSN: [id number] Arrival date & time: 12/11/19  8338     History Chief Complaint  Patient presents with  . Shortness of Breath  . Cough    Victor Thompson is a 59 y.o. male   HPI  Patient presents today for 2 days of shortness of breath.  Patient states that this is an ongoing problem for him and states that he has been on two 5-day courses of Levaquin 500 mg daily over the past month most recently placed on Levaquin 12/15.  Patient states he takes medications for entire dose and felt slightly improved but never completely better.  Recently was seen in emergency department and given single dose of 10 mg Decadron which she states made her feel significantly better.  Patient's most recent chest x-ray was 12/21 during her ED visit which showed no evidence of infiltrate.   Patient states that his main concern today is that he has a burning painful dry cough.  Patient states that he has been sleeping sitting up as he feels like he coughs more when he lays down.  Does deny any orthopnea or PND.  Denies any leg swelling.  Denies any calf tenderness.  Denies any history of clots recent immobilization or surgery.  Denies any fevers.  Patient states that he has been using albuterol nebulizer treatments at home with short relief of his symptoms.  Patient states he is prescribed Symbicort but has not been taking it as he did not realize it was for his lungs.      Past Medical History:  Diagnosis Date  . AICD (automatic cardioverter/defibrillator) present    2015  . Asthma    no meds  . Cardiac arrest ([place]) 2015  . CHF (congestive heart failure) ([place])   . Chronic kidney disease    ckd -stage 5  . Coronary artery disease   . Hypertension   . Myocardial infarction ([place])   . Obesity   . Pneumonia   . Pulmonary edema   . Shortness of breath dyspnea   . Sleep apnea    USES CPAP  . Wears glasses     Patient Active Problem  List   Diagnosis Date Noted  . Community acquired pneumonia of right lower lobe of lung 11/20/2019  . Secondary hyperparathyroidism of renal origin ([place]) 07/15/2019  . Vitamin D deficiency 07/15/2019  . CAD (coronary artery disease), native coronary artery 04/10/2019  . CKD (chronic kidney disease) stage 5, GFR less than 15 ml/min ([facility]) 09/26/2018  . Chronic midline low back pain without sciatica 02/17/2018  . COPD exacerbation ([place]) 03/04/2016  . Defibrillator discharge   . Chronic combined systolic and diastolic CHF (congestive heart failure) ([place]) 01/22/2016  . Erectile dysfunction 01/22/2016  . Allergic rhinitis 01/22/2016  . GERD (gastroesophageal reflux disease) 01/22/2016  . PROTEINURIA 02/17/2010  . Cardiomyopathy, ischemic 02/16/2010  . Obstructive sleep apnea 02/16/2010  . EXTERNAL HEMORRHOIDS 09/26/2009  . Tobacco use disorder in remission 06/03/2009  . Essential hypertension 10/01/2008  . Hyperlipidemia 07/14/2007  . OBESITY 07/14/2007  . Asthma 07/14/2007    Past Surgical History:  Procedure Laterality Date  . AV FISTULA PLACEMENT Right 03/15/2019   Procedure: RIGHT ARM ARTERIOVENOUS (AV) FISTULA CREATION;  Surgeon: Angelia Mould, MD;  Location: [place];  Service: Vascular;  Laterality: Right;  . COLONOSCOPY    . COLONOSCOPY W/ BIOPSIES AND POLYPECTOMY    . CORONARY STENT PLACEMENT  2007  . IMPLANTABLE CARDIOVERTER  DEFIBRILLATOR IMPLANT  2015       Family History  Problem Relation Age of Onset  . Hypertension Mother   . Liver disease Father     Social History   Tobacco Use  . Smoking status: Never Smoker  . Smokeless tobacco: Never Used  Substance Use Topics  . Alcohol use: No  . Drug use: No    Home Medications Prior to Admission medications   Medication Sig Start Date End Date Taking? Authorizing Provider  albuterol (PROVENTIL) (2.5 MG/3ML) 0.083% nebulizer solution Take 3 mLs (2.5 mg total) by nebulization every 6 (six) hours as needed for  wheezing or shortness of breath. 10/01/19   [name], MD  albuterol (VENTOLIN HFA) 108 (90 Base) MCG/ACT inhaler Inhale 2 puffs into the lungs every 4 (four) hours as needed for wheezing or shortness of breath. 10/01/19   [name], MD  amLODipine (NORVASC) 10 MG tablet Take 1 tablet (10 mg total) by mouth daily. 06/28/19   [name], PA-C  aspirin EC 81 MG tablet Take 1 tablet (81 mg total) by mouth daily. 01/22/16   [name], [name], MD  atorvastatin (LIPITOR) 40 MG tablet Take 1 tablet (40 mg total) by mouth at bedtime. 06/28/19   [name], PA-C  budesonide-formoterol (SYMBICORT) 80-4.5 MCG/ACT inhaler Inhale 2 puffs into the lungs 2 (two) times daily. 10/01/19   [name], MD  calcitRIOL (ROCALTROL) 0.5 MCG capsule Take 0.5 mcg by mouth daily.  08/16/18   [provider]  carvedilol (COREG) 25 MG tablet Take 1 tablet (25 mg total) by mouth 2 (two) times daily with a meal. 10/01/19   [name], MD  Cholecalciferol (VITAMIN D-3) 125 MCG (5000 UT) TABS Take 5,000 Units by mouth daily.     [provider]  cyclobenzaprine (FLEXERIL) 10 MG tablet TAKE 1 TABLET (10 MG TOTAL) BY MOUTH AT BEDTIME. AS NEEDED FOR PAIN 12/21/18   [name], MD  EPINEPHrine 0.3 mg/0.3 mL IJ SOAJ injection Inject 0.3 mLs into the muscle once as needed for anaphylaxis.  02/06/16   [provider]  fluticasone (FLONASE) 50 MCG/ACT nasal spray Place 1 spray into both nostrils daily. 12/31/18   Volanda Napoleon, PA-C  hydrALAZINE (APRESOLINE) 10 MG tablet Take 2 tablets (20 mg total) by mouth 2 (two) times daily. 10/01/19   [name], MD  isosorbide mononitrate (IMDUR) 30 MG 24 hr tablet Take 1 tablet (30 mg total) by mouth daily. 10/01/19   [name], MD  levofloxacin (LEVAQUIN) 750 MG tablet Take 1 tablet (750 mg total) by mouth daily for 5 days. 12/11/19 12/16/19  Tedd Sias, PA  loratadine (CLARITIN) 10 MG tablet Take 10 mg  by mouth daily as needed for allergies.    [provider]  torsemide (DEMADEX) 20 MG tablet Take 2 tablets (40 mg total) by mouth daily. 10/01/19   [name], MD    Allergies    Ace inhibitors, Influenza vaccines, Ketorolac, Lidocaine, Lisinopril, and Penicillins  Review of Systems   Review of Systems  Constitutional: Positive for fatigue. Negative for chills and fever.  HENT: Negative for congestion.   Eyes: Negative for pain.  Respiratory: Positive for cough, chest tightness and shortness of breath.   Cardiovascular: Negative for chest pain, palpitations and leg swelling.  Gastrointestinal: Negative for abdominal pain and vomiting.  Genitourinary: Positive for scrotal swelling. Negative for dysuria.  Musculoskeletal: Negative for myalgias.  Skin: Negative for  rash.  Neurological: Negative for dizziness and headaches.    Physical Exam Updated Vital Signs BP 125/79   Pulse 71   Temp 97.8 F (36.6 C) (Oral)   Resp 18   SpO2 97%   Physical Exam Vitals and nursing note reviewed.  Constitutional:      General: He is not in acute distress.    Appearance: He is not ill-appearing.  HENT:     Head: Normocephalic and atraumatic.     Nose: Nose normal.     Mouth/Throat:     Mouth: Mucous membranes are moist.  Eyes:     General: No scleral icterus. Cardiovascular:     Rate and Rhythm: Normal rate and regular rhythm.     Pulses: Normal pulses.     Heart sounds: Normal heart sounds.  Pulmonary:     Effort: Pulmonary effort is normal. No respiratory distress.     Breath sounds: No wheezing.     Comments: No increased work of breathing, lungs clear to auscultation bilaterally with mildly decreased lung sounds in the bases Abdominal:     Palpations: Abdomen is soft.     Tenderness: There is no abdominal tenderness.  Musculoskeletal:     Cervical back: Normal range of motion.     Right lower leg: Edema present.     Left lower leg: Edema present.      Comments: Trace stable bilateral lower extremity edema at the ankles  Skin:    General: Skin is warm and dry.     Capillary Refill: Capillary refill takes less than 2 seconds.  Neurological:     Mental Status: He is alert. Mental status is at baseline.  Psychiatric:        Mood and Affect: Mood normal.        Behavior: Behavior normal.     ED Results / Procedures / Treatments   Labs (all labs ordered are listed, but only abnormal results are displayed) Labs Reviewed  BASIC METABOLIC PANEL - Abnormal; Notable for the following components:      Result Value   CO2 19 (*)    Glucose, Bld 125 (*)    BUN 67 (*)    Creatinine, Ser 7.13 (*)    Calcium 8.6 (*)    GFR calc non Af Amer 8 (*)    GFR calc Af Amer 9 (*)    All other components within normal limits  CBC - Abnormal; Notable for the following components:   RBC 3.80 (*)    Hemoglobin 11.0 (*)    HCT 35.0 (*)    RDW 16.9 (*)    All other components within normal limits  TROPONIN I (HIGH SENSITIVITY) - Abnormal; Notable for the following components:   Troponin I (High Sensitivity) 47 (*)    All other components within normal limits    EKG EKG Interpretation  Date/Time:  Tuesday December 11 2019 08:19:28 EST Ventricular Rate:  79 PR Interval:  184 QRS Duration: 142 QT Interval:  460 QTC Calculation: 527 R Axis:   -66 Text Interpretation: Sinus rhythm with occasional Premature ventricular complexes Left axis deviation Non-specific intra-ventricular conduction block Abnormal ECG Confirmed by Carmin Muskrat [phone]) on 12/11/2019 3:36:14 PM   Radiology DG Chest 2 View  Result Date: 12/11/2019 CLINICAL DATA:  Shortness of breath, cough EXAM: CHEST - 2 VIEW COMPARISON:  11/26/2019 FINDINGS: Unchanged positioning of single lead left-sided implanted cardiac device. The heart size and mediastinal contours are stable. Slightly increasing at right lower lobe  airspace opacity. The visualized skeletal structures are unremarkable.  IMPRESSION: Slightly increasing right lower lobe airspace opacity suspicious for developing pneumonia. Electronically Signed   By: Davina Poke D.O.   On: 12/11/2019 08:58    Procedures Procedures (including critical care time)  Medications Ordered in ED Medications  sodium chloride flush (NS) 0.9 % injection 3 mL (has no administration in time range)    ED Course  I have reviewed the triage vital signs and the nursing notes.  Pertinent labs & imaging results that were available during my care of the patient were reviewed by me and considered in my medical decision making (see chart for details).    MDM Rules/Calculators/A&P                      Patient presents today for 1 month of shortness of breath that is intermittent waxing and waning over this whole month.  Patient states that he has had a nonproductive cough.  Has been treated for pneumonia twice.    Physical exam is reassuring.  No increased work of breathing.  He is not wheezing.  Dyspneic on exam.  Pulse ox within normal limits.  Not tachypneic.  No evidence of fluid overload on my exam.  Abdomen appears to be chronically distended patient denies any leg swelling or abdominal swelling.  No prominent JVD.  Patient has no clinical signs of heart failure/fluid overload state, pneumothorax, PE.  Doubt ACS as pain is present only during coughing spells.  Troponin drawn in triage is stable at patient's prior elevated normal.  No change from baseline.  Patient has elevated BUN and creatinine consistent with his baseline.  Chest x-ray inability reviewed by myself.  Official read indicates possible infiltrate/consolidation in the right lower lobe.  On my review this appears to be some decrease in the clarity of the right heart border however there appears to be a more concerning opacity on the left lower base.  As patient has been treated twice with Levaquin will treat with outpatient Pneumonia empiric therapy of Augmentin plus  azithromycin.  Patient given instructions on conservative therapies including NyQuil, Robitussin.  Recommended against NSAIDs as patient has some degree of heart failure.  Patient with stratified with curb 65 rule.  Patient's only scoring criteria is from elevated BUN which is chronic for him.  Patient is a low risk for outpatient pneumonia treatment.  He will follow up with pulmonology.  As he is penicillin allergic and states he has anaphylactic reactions with tongue swelling to penicillin medications will use monotherapy Levaquin instead of amoxicillin/azithromycin.  Discussed with patient that he likely has bronchitis and that the infiltrate on his x-ray is likely viral however.  Recommended that patient expect at least 1 to 2 weeks of coughing to occur.  Patient given strict return precautions.  The medical records were personally reviewed by myself. I personally reviewed all lab results and interpreted all imaging studies and either concurred with their official read or contacted radiology for clarification.   This patient appears reasonably screened and I doubt any other medical condition requiring further workup, evaluation, or treatment in the ED at this time prior to discharge.   Patient's vitals are WNL apart from vital sign abnormalities discussed above, patient is in NAD, and able to ambulate in the ED at their baseline. Pain has been managed or a plan has been made for home management and has no complaints prior to discharge. Patient is comfortable with above plan and  for discharge at this time. All questions were answered prior to disposition. Results from the ER workup discussed with the patient face to face and all questions answered to the best of my ability. The patient is safe for discharge with strict return precautions. Patient appears safe for discharge with appropriate follow-up. Conveyed my impression with the patient and they voiced understanding and are agreeable to plan.   An  After Visit Summary was printed and given to the patient.  Portions of this note were generated with [employer]. Dictation errors may occur despite best attempts at proofreading.   Khole Branch was evaluated in Emergency Department on 12/11/2019 for the symptoms described in the history of present illness. He was evaluated in the context of the global COVID-19 pandemic, which necessitated consideration that the patient might be at risk for infection with the SARS-CoV-2 virus that causes COVID-19. Institutional protocols and algorithms that pertain to the evaluation of patients at risk for COVID-19 are in a state of rapid change based on information released by regulatory bodies including the CDC and federal and state organizations. These policies and algorithms were followed during the patient's care in the ED.   Final Clinical Impression(s) / ED Diagnoses Final diagnoses:  Community acquired pneumonia of left lower lobe of lung    Rx / DC Orders ED Discharge Orders         Ordered    levofloxacin (LEVAQUIN) 750 MG tablet  Daily     12/11/19 1556           Pati Gallo [place], [place] 12/11/19 1715    Carmin Muskrat, MD 12/12/19 [phone]

## 2019-12-11 NOTE — Discharge Instructions (Addendum)
Take antibiotics as prescribed.  Follow-up with your pulmonologist.  Return to ED if you have any new or concerning symptoms.

## 2019-12-11 NOTE — ED Triage Notes (Signed)
Pt reports ongoing sob for extended amount of time. Has nonproductive cough and recently treated for mild pneumonia. Reports taking all meds as prescribed. Has metallic taste in his mouth when he coughs, denies swelling to extremities.

## 2019-12-20 MED FILL — AMLODIPINE BESYLATE 10 MG T: 10 | 30 days supply | Qty: 30 | Fill #4

## 2019-12-24 ENCOUNTER — Ambulatory Visit: Payer: BC Managed Care – PPO | Attending: Internal Medicine | Admitting: Internal Medicine

## 2019-12-24 ENCOUNTER — Other Ambulatory Visit: Payer: Self-pay

## 2019-12-24 DIAGNOSIS — R0600 Dyspnea, unspecified: Secondary | ICD-10-CM | POA: Diagnosis not present

## 2019-12-24 DIAGNOSIS — J189 Pneumonia, unspecified organism: Secondary | ICD-10-CM | POA: Diagnosis not present

## 2019-12-24 DIAGNOSIS — R0609 Other forms of dyspnea: Secondary | ICD-10-CM

## 2019-12-24 NOTE — Progress Notes (Signed)
Virtual Visit via Telephone Note Due to current restrictions/limitations of in-office visits due to the COVID-19 pandemic, this scheduled clinical appointment was converted to a telehealth visit  I connected with Victor Thompson on 12/24/19 at 8:49 a.m by telephone and verified that I am speaking with the correct person using two identifiers. I am in my office.  The patient is at home.  Only the patient and myself participated in this encounter.  I discussed the limitations, risks, security and privacy concerns of performing an evaluation and management service by telephone and the availability of in person appointments. I also discussed with the patient that there may be a patient responsible charge related to this service. The patient expressed understanding and agreed to proceed.   History of Present Illness: Pt with hx of CKD stage5 (Dr. Joelyn Oms), HTN, CAD (with hx of cardiac arrest and), ICD, combined sys/dia CHF (EF 40-45%08/2019), HL, Obese, COPD,chronic lower back pain with multilevel spondylosis.  Purpose of today's visit is follow-up from the emergency room.  My last evaluation with patient was 11/08/2019.  I had ordered a chest x-ray, PFTs and VQ scan for his symptoms of shortness of breath.  The chest x-ray revealed hazy right lower lobe infiltrate.  VQ scan was negative.  PFTs not done as yet.  Outpatient PFTs are on hold right now as all pulmonary techs are being used to help with the inpatient management of patients with Covid.  Patient was treated with 5-day course of Avelox.  He subsequently saw Dr. Joya Gaskins in follow-up visit on 11/20/2019.  He was advised to continue Symbicort.  Since last visit, he was seen in ER x 2.  First visit was 11/26/2019 with complaints of increasing shortness of breath and a rattling cough.  He did not have any fever.  His pulse ox was good.  Chest x-ray revealed no acute disease.  BNP was elevated at 680.  CBC showed stable mild anemia.  Patient was given  one-time dose of Decadron 10 mg.  He returned to the ER again on 12/11/2019 with similar complaint.  Chest x-ray revealed slight increasing right lower lobe airspace opacity suspicious for developing pneumonia.  Patient revealed that he was not using Symbicort consistently.  He was advised to do so.  Patient given 5-day course of Levaquin. -Today he states that he feels better this wk than he did in past 2 wks. SOB if he over exerts himself or "if I walk 50 yards there is SOB." Cough has resolved.  Using Symbicort BID and Albuterol PRN.  Uses neb machine every other day. No fever. Slight LE edema.  Does not wgh self but does have scale at home He works [employer].  He wears a mask when there is a lot of dust.  He does not feel that his symptoms get any better when he is away from work.   Observations/Objective: Lab Results  Component Value Date   WBC 6.1 12/11/2019   HGB 11.0 (L) 12/11/2019   HCT 35.0 (L) 12/11/2019   MCV 92.1 12/11/2019   PLT 208 12/11/2019     Chemistry      Component Value Date/Time   NA 142 12/11/2019 0806   NA 146 (H) 07/13/2019 0000   K 3.8 12/11/2019 0806   CL 109 12/11/2019 0806   CO2 19 (L) 12/11/2019 0806   BUN 67 (H) 12/11/2019 0806   BUN 77 (HH) 07/13/2019 0000   CREATININE 7.13 (H) 12/11/2019 0806   CREATININE 1.97 (H) 03/15/2016  1223      Component Value Date/Time   CALCIUM 8.6 (L) 12/11/2019 0806   ALKPHOS 74 11/26/2019 1007   AST 17 11/26/2019 1007   ALT 15 11/26/2019 1007   BILITOT 1.1 11/26/2019 1007       Assessment and Plan: 1. Recurrent pneumonia -Patient has been treated twice in the past month with antibiotics for development of hazy opacity in the right lower lobe.  He reports today that he is feeling better and his cough has resolved.  He still has some dyspnea on exertion which I think may be multifactorial considering he has CHF and CKD stage V. -Plan to get PFTs once they start doing them as an outpatient  again. Encouraged him to use the Symbicort twice a day. If there is any recurrence of this right lower lobe infiltrate, I think we may need to do CT scan to eval that area in more detail  2. DOE (dyspnea on exertion) See #1 above   Follow Up Instructions: 2 to 3 months   I discussed the assessment and treatment plan with the patient. The patient was provided an opportunity to ask questions and all were answered. The patient agreed with the plan and demonstrated an understanding of the instructions.   The patient was advised to call back or seek an in-person evaluation if the symptoms worsen or if the condition fails to improve as anticipated.  I provided 13 minutes of non-face-to-face time during this encounter.   Karle Plumber, MD

## 2019-12-26 MED FILL — CALCITRIOL 0.5 MCG CAPS: 0.5 | 30 days supply | Qty: 30 | Fill #2

## 2020-01-11 MED FILL — ISOSORBIDE MN ER 30 MG TAB: 30 | 30 days supply | Qty: 30 | Fill #5

## 2020-01-22 MED FILL — TORSEMIDE 20 MG TABLET: 20 | 90 days supply | Qty: 180 | Fill #0

## 2020-02-01 MED FILL — AMLODIPINE BESYLATE 10 MG T: 10 | 30 days supply | Qty: 30 | Fill #5

## 2020-02-12 MED FILL — CARVEDILOL 25 MG TABLET: 25 | 90 days supply | Qty: 180 | Fill #1

## 2020-02-12 MED FILL — CALCITRIOL 0.5 MCG CAPS: 0.5 | 30 days supply | Qty: 30 | Fill #3

## 2020-02-13 ENCOUNTER — Telehealth: Payer: Self-pay | Admitting: Internal Medicine

## 2020-02-13 NOTE — Telephone Encounter (Signed)
Contacted pt and left a detailed vm informing him of Dr. [name] message and provided telephone number to call and schedule and if he has any questions or concerns to give me a call

## 2020-02-13 NOTE — Telephone Encounter (Signed)
Patient came in requesting to know if it's safe for him to take the COVID shot due to his health conditions. Please f/u

## 2020-02-13 NOTE — Telephone Encounter (Signed)
Will forward to pcp

## 2020-03-05 MED FILL — ISOSORBIDE MN ER 30 MG TAB: 30 | 30 days supply | Qty: 30 | Fill #6

## 2020-03-20 ENCOUNTER — Ambulatory Visit
Admission: EM | Admit: 2020-03-20 | Discharge: 2020-03-20 | Disposition: A | Discharge status: Home / Self Care | Payer: BC Managed Care – PPO

## 2020-03-20 NOTE — ED Notes (Signed)
Patient was last exposed yesterday to a coworker that tested positive for covid.  Patient was given information about local sites, website and testing numbers for information about testing sites.  Information provided to patient at [place] direction

## 2020-03-24 ENCOUNTER — Other Ambulatory Visit: Payer: Self-pay | Admitting: Physician Assistant

## 2020-03-24 ENCOUNTER — Ambulatory Visit: Payer: BC Managed Care – PPO | Attending: Internal Medicine | Admitting: Internal Medicine

## 2020-03-24 ENCOUNTER — Other Ambulatory Visit: Payer: Self-pay | Admitting: Internal Medicine

## 2020-03-24 ENCOUNTER — Other Ambulatory Visit: Payer: Self-pay

## 2020-03-24 ENCOUNTER — Encounter: Payer: Self-pay | Admitting: Internal Medicine

## 2020-03-24 VITALS — BP 147/89 | HR 71 | Temp 97.9°F | Resp 16 | Wt 266.4 lb

## 2020-03-24 DIAGNOSIS — R042 Hemoptysis: Secondary | ICD-10-CM | POA: Diagnosis not present

## 2020-03-24 DIAGNOSIS — I5042 Chronic combined systolic (congestive) and diastolic (congestive) heart failure: Secondary | ICD-10-CM

## 2020-03-24 DIAGNOSIS — I10 Essential (primary) hypertension: Secondary | ICD-10-CM

## 2020-03-24 DIAGNOSIS — Z6841 Body Mass Index (BMI) 40.0 and over, adult: Secondary | ICD-10-CM

## 2020-03-24 DIAGNOSIS — I2510 Atherosclerotic heart disease of native coronary artery without angina pectoris: Secondary | ICD-10-CM

## 2020-03-24 DIAGNOSIS — J440 Chronic obstructive pulmonary disease with acute lower respiratory infection: Secondary | ICD-10-CM | POA: Diagnosis not present

## 2020-03-24 DIAGNOSIS — I472 Ventricular tachycardia, unspecified: Secondary | ICD-10-CM

## 2020-03-24 DIAGNOSIS — N185 Chronic kidney disease, stage 5: Secondary | ICD-10-CM

## 2020-03-24 DIAGNOSIS — J209 Acute bronchitis, unspecified: Secondary | ICD-10-CM

## 2020-03-24 MED ORDER — AMLODIPINE BESYLATE 10 MG PO TABS: 10.0000 mg | ORAL_TABLET | Freq: Every day | ORAL | 1 refills | Status: DC

## 2020-03-24 MED ORDER — EPINEPHRINE 0.3 MG/0.3ML IJ SOAJ: 0.3000 mg | Freq: Once | INTRAMUSCULAR | 1 refills | Status: DC | PRN

## 2020-03-24 MED ORDER — AZITHROMYCIN 250 MG PO TABS: ORAL_TABLET | ORAL | 0 refills | Status: DC

## 2020-03-24 MED FILL — AMLODIPINE BESYLATE 10 MG T: 10 | 30 days supply | Qty: 30 | Fill #0

## 2020-03-24 MED FILL — EPINEPHRINE 0.3 MG AUTO-INJ: 0.3 | 2 days supply | Qty: 2 | Fill #0

## 2020-03-24 MED FILL — CALCITRIOL 0.5 MCG CAPS: 0.5 | 30 days supply | Qty: 30 | Fill #4

## 2020-03-24 MED FILL — AZITHROMYCIN 250 MG TABLET: 250 | 5 days supply | Qty: 6 | Fill #0

## 2020-03-24 NOTE — Patient Instructions (Signed)
Please give patient an appointment with Dr. Joya Gaskins within the next 1 to 3 weeks.

## 2020-03-24 NOTE — Progress Notes (Signed)
Patient ID: Victor Thompson, male    DOB: [birth year]  MRN: [id number]  CC: Hypertension   Subjective: Victor Thompson is a 59 y.o. male who presents for chronic ds management His concerns today include:  Pt with hx of CKD stage5(Dr. Joelyn Oms), HTN, CAD (with hx of cardiac arrest and), ICD, combined sys/dia CHF(EF 40-45%08/2019), HL, Obese, COPD,chronic lower back pain with multilevel spondylosis.    Received 2nd shot of Pfizer vaccine yesterday.  COPD:  C/o cough productive of pinkish, reddish mucous x 1 wk.  Thinks he needs abx.  Treated with antibiotics for right lower lobe pneumonia in December of last year and January of this year. No fever.  Some SOB with exertion which is chronic Using Symbicort BID.  Feels it helps.  Uses Albuterol inh once a day and neb 2 x a wk  HYPERTENSION/CAD Currently taking: see medication list Med Adherence: [x]  Yes took meds already but out Norvasc x 2 days    []  No Medication side effects: []  Yes    [x]  No Adherence with salt restriction: [x]  Yes    []  No Home Monitoring?: []  Yes    [x]  No Monitoring Frequency: []  Yes    []  No Home BP results range: []  Yes    []  No SOB? [x]  Yes    []  No Chest Pain?: []  Yes    [x]  No Leg swelling?: []  Yes    [x]  No Headaches?: []  Yes    [x]  No Dizziness? []  Yes    [x]  No Comments: no palpitations.  No firing of ICD.   Obesity:  Down 8 lbs since 11/2019. He has been actively cutting back on amount that he eats and has cut back on junk foods   CKD 5: Followed by [facility].  Appt 4/26/202021.  Seeing [facility] for pre-transplant work up Had blood test 1 wk ago for Dr. Joelyn Oms through [place].  I do not have those results as yet. Patient Active Problem List   Diagnosis Date Noted  . Community acquired pneumonia of right lower lobe of lung 11/20/2019  . Secondary hyperparathyroidism of renal origin ([place]) 07/15/2019  . Vitamin D deficiency 07/15/2019  . CAD (coronary artery disease), native coronary artery  04/10/2019  . CKD (chronic kidney disease) stage 5, GFR less than 15 ml/min (HCC) 09/26/2018  . Chronic midline low back pain without sciatica 02/17/2018  . VT (ventricular tachycardia) ([place]) 03/29/2016  . COPD exacerbation ([place]) 03/04/2016  . Defibrillator discharge   . Chronic combined systolic and diastolic CHF (congestive heart failure) ([place]) 01/22/2016  . Erectile dysfunction 01/22/2016  . Allergic rhinitis 01/22/2016  . GERD (gastroesophageal reflux disease) 01/22/2016  . PROTEINURIA 02/17/2010  . Cardiomyopathy, ischemic 02/16/2010  . Obstructive sleep apnea 02/16/2010  . EXTERNAL HEMORRHOIDS 09/26/2009  . Tobacco use disorder in remission 06/03/2009  . Essential hypertension 10/01/2008  . Hyperlipidemia 07/14/2007  . Class 3 severe obesity due to excess calories with serious comorbidity and body mass index (BMI) of 40.0 to 44.9 in adult ([place]) 07/14/2007  . Asthma 07/14/2007     Current Outpatient Medications on File Prior to Visit  Medication Sig Dispense Refill  . albuterol (PROVENTIL) (2.5 MG/3ML) 0.083% nebulizer solution Take 3 mLs (2.5 mg total) by nebulization every 6 (six) hours as needed for wheezing or shortness of breath. 75 mL 12  . albuterol (VENTOLIN HFA) 108 (90 Base) MCG/ACT inhaler Inhale 2 puffs into the lungs every 4 (four) hours as needed for wheezing or shortness of breath.  8 g 6  . aspirin EC 81 MG tablet Take 1 tablet (81 mg total) by mouth daily. 30 tablet 5  . atorvastatin (LIPITOR) 40 MG tablet Take 1 tablet (40 mg total) by mouth at bedtime. 90 tablet 1  . budesonide-formoterol (SYMBICORT) 80-4.5 MCG/ACT inhaler Inhale 2 puffs into the lungs 2 (two) times daily. 1 Inhaler 6  . calcitRIOL (ROCALTROL) 0.5 MCG capsule Take 0.5 mcg by mouth daily.   11  . carvedilol (COREG) 25 MG tablet Take 1 tablet (25 mg total) by mouth 2 (two) times daily with a meal. 180 tablet 1  . Cholecalciferol (VITAMIN D-3) 125 MCG (5000 UT) TABS Take 5,000 Units by mouth daily.      . cyclobenzaprine (FLEXERIL) 10 MG tablet TAKE 1 TABLET (10 MG TOTAL) BY MOUTH AT BEDTIME. AS NEEDED FOR PAIN 30 tablet 0  . fluticasone (FLONASE) 50 MCG/ACT nasal spray Place 1 spray into both nostrils daily. 16 g 2  . hydrALAZINE (APRESOLINE) 10 MG tablet Take 2 tablets (20 mg total) by mouth 2 (two) times daily. 120 tablet 6  . isosorbide mononitrate (IMDUR) 30 MG 24 hr tablet Take 1 tablet (30 mg total) by mouth daily. 90 tablet 1  . loratadine (CLARITIN) 10 MG tablet Take 10 mg by mouth daily as needed for allergies.    Marland Kitchen torsemide (DEMADEX) 20 MG tablet Take 2 tablets (40 mg total) by mouth daily. 180 tablet 1   No current facility-administered medications on file prior to visit.    Allergies  Allergen Reactions  . Ace Inhibitors Swelling    Swelling of the tongue  . Influenza Vaccines Hives and Swelling    SWELLING REACTION UNSPECIFIED   . Ketorolac Swelling    SWELLING REACTION UNSPECIFIED   . Lidocaine Swelling    TONGUE SWELLS  . Lisinopril Swelling    TONGUE SWELLING Pt reported problem with a BP med which sounded like  lisinopril  But as of 09/19/06,pt had tolerated altace without problem  . Penicillins Swelling    TONGUE SWELLS Has patient had a PCN reaction causing immediate rash, facial/tongue/throat swelling, SOB or lightheadedness with hypotension: Yes Has patient had a PCN reaction causing severe rash involving mucus membranes or skin necrosis: No Has patient had a PCN reaction that required hospitalization No Has patient had a PCN reaction occurring within the last 10 years: Yes If all of the above answers are "NO", then may proceed with Cephalosporin use.     Social History   Socioeconomic History  . Marital status: Single    Spouse name: Not on file  . Number of children: Not on file  . Years of education: Not on file  . Highest education level: Not on file  Occupational History  . Not on file  Tobacco Use  . Smoking status: Never Smoker  .  Smokeless tobacco: Never Used  Substance and Sexual Activity  . Alcohol use: No  . Drug use: No  . Sexual activity: Yes  Other Topics Concern  . Not on file  Social History Narrative   ** Merged History Encounter **       Social Determinants of Health   Financial Resource Strain:   . Difficulty of Paying Living Expenses:   Food Insecurity:   . Worried About Charity fundraiser in the Last Year:   . Arboriculturist in the Last Year:   Transportation Needs:   . Film/video editor (Medical):   Marland Kitchen Lack of Transportation (  Non-Medical):   Physical Activity:   . Days of Exercise per Week:   . Minutes of Exercise per Session:   Stress:   . Feeling of Stress :   Social Connections:   . Frequency of Communication with Friends and Family:   . Frequency of Social Gatherings with Friends and Family:   . Attends Religious Services:   . Active Member of Clubs or Organizations:   . Attends Archivist Meetings:   Marland Kitchen Marital Status:   Intimate Partner Violence:   . Fear of Current or Ex-Partner:   . Emotionally Abused:   Marland Kitchen Physically Abused:   . Sexually Abused:     Family History  Problem Relation Age of Onset  . Hypertension Mother   . Liver disease Father     Past Surgical History:  Procedure Laterality Date  . AV FISTULA PLACEMENT Right 03/15/2019   Procedure: RIGHT ARM ARTERIOVENOUS (AV) FISTULA CREATION;  Surgeon: Angelia Mould, MD;  Location: [facility];  Service: Vascular;  Laterality: Right;  . COLONOSCOPY    . COLONOSCOPY W/ BIOPSIES AND POLYPECTOMY    . CORONARY STENT PLACEMENT  2007  . IMPLANTABLE CARDIOVERTER DEFIBRILLATOR IMPLANT  2015    ROS: Review of Systems Negative except as stated above  PHYSICAL EXAM: BP (!) 147/89   Pulse 71   Temp 97.9 F (36.6 C)   Resp 16   Wt 266 lb 6.4 oz (120.8 kg)   SpO2 94%   BMI 41.72 kg/m   Wt Readings from Last 3 Encounters:  03/24/20 266 lb 6.4 oz (120.8 kg)  11/20/19 278 lb (126.1 kg)  11/14/19  278 lb (126.1 kg)    Physical Exam  General appearance - alert, well appearing, obese middle-aged African-American male and in no distress Mental status - normal mood, behavior, speech, dress, motor activity, and thought processes Neck - supple, no significant adenopathy Chest -breath sounds slightly decreased but no wheezes, rales or rhonchi, symmetric air entry Heart - normal rate, regular rhythm, normal S1, S2, no murmurs, rubs, clicks or gallops Extremities -trace lower extremity edema CMP Latest Ref Rng & Units 12/11/2019 11/26/2019 07/23/2019  Glucose 70 - 99 mg/dL 125(H) 150(H) 108(H)  BUN 6 - 20 mg/dL 67(H) 54(H) 62(H)  Creatinine 0.61 - 1.24 mg/dL 7.13(H) 7.40(H) 7.56(H)  Sodium 135 - 145 mmol/L 142 142 142  Potassium 3.5 - 5.1 mmol/L 3.8 3.7 3.7  Chloride 98 - 111 mmol/L 109 111 111  CO2 22 - 32 mmol/L 19(L) 20(L) 20(L)  Calcium 8.9 - 10.3 mg/dL 8.6(L) 8.7(L) 8.2(L)  Total Protein 6.5 - 8.1 g/dL - 6.8 6.4(L)  Total Bilirubin 0.3 - 1.2 mg/dL - 1.1 0.7  Alkaline Phos 38 - 126 U/L - 74 59  AST 15 - 41 U/L - 17 22  ALT 0 - 44 U/L - 15 20   Lipid Panel     Component Value Date/Time   CHOL 182 03/05/2016 0503   TRIG 118 03/05/2016 0503   HDL 38 (L) 03/05/2016 0503   CHOLHDL 4.8 03/05/2016 0503   VLDL 24 03/05/2016 0503   LDLCALC 120 (H) 03/05/2016 0503    CBC    Component Value Date/Time   WBC 6.1 12/11/2019 0806   RBC 3.80 (L) 12/11/2019 0806   HGB 11.0 (L) 12/11/2019 0806   HGB 11.9 (L) 07/27/2018 1006   HCT 35.0 (L) 12/11/2019 0806   HCT 35.1 (L) 07/27/2018 1006   PLT 208 12/11/2019 0806   PLT 205 07/27/2018 1006  MCV 92.1 12/11/2019 0806   MCV 88 07/27/2018 1006   MCH 28.9 12/11/2019 0806   MCHC 31.4 12/11/2019 0806   RDW 16.9 (H) 12/11/2019 0806   RDW 16.1 (H) 07/27/2018 1006   LYMPHSABS 0.6 (L) 11/26/2019 1007   LYMPHSABS 1.1 07/27/2018 1006   MONOABS 0.6 11/26/2019 1007   EOSABS 0.5 11/26/2019 1007   EOSABS 0.3 07/27/2018 1006   BASOSABS 0.0  11/26/2019 1007   BASOSABS 0.0 07/27/2018 1006    ASSESSMENT AND PLAN: 1. COPD with acute bronchitis ([place]) 2. Hemoptysis We will treat with antibiotics and get him in with Dr. Joya Gaskins - azithromycin (ZITHROMAX) 250 MG tablet; Take 2 tablets p.o. on day 1 then 1 tablet daily  Dispense: 6 tablet; Refill: 0   3. Essential hypertension Not at goal.  Needing refill on amlodipine.  Continue current medications and low-salt diet - amLODipine (NORVASC) 10 MG tablet; Take 1 tablet (10 mg total) by mouth daily.  Dispense: 90 tablet; Refill: 1  4. Class 3 severe obesity due to excess calories with serious comorbidity and body mass index (BMI) of 40.0 to 44.9 in adult [facility]) Commended him on weight loss so far.  Encouraged him to continue trying to eat healthier  5. Coronary artery disease involving native coronary artery of native heart without angina pectoris Stable.  Continue carvedilol, isosorbide, aspirin and atorvastatin  6. Chronic combined systolic and diastolic CHF (congestive heart failure) (HCC) Stable.  7. CKD (chronic kidney disease) stage 5, GFR less than 15 ml/min (HCC) Followed by nephrology.  Currently being considered for the transplant list  8. VT (ventricular tachycardia) (HCC) No recent firing of his ICD    Patient was given the opportunity to ask questions.  Patient verbalized understanding of the plan and was able to repeat key elements of the plan.   No orders of the defined types were placed in this encounter.    Requested Prescriptions   Signed Prescriptions Disp Refills  . EPINEPHrine 0.3 mg/0.3 mL IJ SOAJ injection 1 each 1    Sig: Inject 0.3 mLs (0.3 mg total) into the muscle once as needed for anaphylaxis.  Marland Kitchen amLODipine (NORVASC) 10 MG tablet 90 tablet 1    Sig: Take 1 tablet (10 mg total) by mouth daily.  Marland Kitchen azithromycin (ZITHROMAX) 250 MG tablet 6 tablet 0    Sig: Take 2 tablets p.o. on day 1 then 1 tablet daily    Return in about 3 months (around  06/23/2020) for in person visit.  Karle Plumber, MD, FACP

## 2020-04-07 ENCOUNTER — Other Ambulatory Visit: Payer: Self-pay

## 2020-04-07 ENCOUNTER — Ambulatory Visit: Payer: BC Managed Care – PPO | Attending: Critical Care Medicine | Admitting: Critical Care Medicine

## 2020-04-07 ENCOUNTER — Encounter: Payer: Self-pay | Admitting: Critical Care Medicine

## 2020-04-07 VITALS — BP 139/86 | HR 78 | Temp 97.8°F | Resp 19 | Ht 67.0 in | Wt 258.0 lb

## 2020-04-07 DIAGNOSIS — Z01812 Encounter for preprocedural laboratory examination: Secondary | ICD-10-CM | POA: Diagnosis not present

## 2020-04-07 DIAGNOSIS — J4551 Severe persistent asthma with (acute) exacerbation: Secondary | ICD-10-CM

## 2020-04-07 DIAGNOSIS — Z20822 Contact with and (suspected) exposure to covid-19: Secondary | ICD-10-CM

## 2020-04-07 DIAGNOSIS — N185 Chronic kidney disease, stage 5: Secondary | ICD-10-CM | POA: Diagnosis not present

## 2020-04-07 DIAGNOSIS — G4733 Obstructive sleep apnea (adult) (pediatric): Secondary | ICD-10-CM | POA: Diagnosis not present

## 2020-04-07 MED ORDER — PREDNISONE 10 MG PO TABS: ORAL_TABLET | ORAL | 0 refills | Status: DC

## 2020-04-07 MED FILL — predniSONE 10 MG TABS: 10 | 5 days supply | Qty: 20 | Fill #0

## 2020-04-07 NOTE — Progress Notes (Signed)
Subjective:    Patient ID: Victor Thompson, male    DOB: 01/06/1961, 59 y.o.   MRN: [id number]  58 y.o.M referred by PCP for hemoptysis, Asthma with bronchitis  This patient is seen on referral for evaluation of increased cough increased shortness of breath and history of hemoptysis.  The patient states the hemoptysis has stopped.  Before the patient had pink frothy mucus with flecks of blood.  Patient notes increased wheezing and is short of breath with exertion only.  Patient also notes no prior history of known asthma but has been labeled as having COPD in the past although he states is a lifelong never smoker however the records suggest he is an ex-smoker.  He has had history of pneumonia right lower lobe and I saw the patient for this back in December 2020.  Recent chest x-ray showed resolution of any infiltrates.  The patient saw PCP on April 19 and was prescribed azithromycin however he has not started this until yesterday on May 2.  Patient does have history of hypertension, coronary disease, chronic systolic diastolic heart failure, chronic kidney disease stage V nearing need for either dialysis and is being evaluated for kidney transplantation.  Patient still makes adequate urine.  His weight is actually down 20 pounds.  Patient does have a Symbicort inhaler but upon review does not use this with good efficiency.  He sprays 2 sprays in his throat and then takes an inhalation.  He does the same thing with the albuterol inhaler as well  No PFTs have been obtained   Past Medical History:  Diagnosis Date  . AICD (automatic cardioverter/defibrillator) present    2015  . Asthma    no meds  . Cardiac arrest ([place]) 2015  . CHF (congestive heart failure) ([place])   . Chronic kidney disease    ckd -stage 5  . Coronary artery disease   . Hypertension   . Myocardial infarction ([place])   . Obesity   . Pneumonia   . Pulmonary edema   . Shortness of breath dyspnea   . Sleep apnea    USES CPAP   . Wears glasses      Family History  Problem Relation Age of Onset  . Hypertension Mother   . Liver disease Father      Social History   Socioeconomic History  . Marital status: Single    Spouse name: Not on file  . Number of children: Not on file  . Years of education: Not on file  . Highest education level: Not on file  Occupational History  . Not on file  Tobacco Use  . Smoking status: Never Smoker  . Smokeless tobacco: Never Used  Substance and Sexual Activity  . Alcohol use: No  . Drug use: No  . Sexual activity: Yes  Other Topics Concern  . Not on file  Social History Narrative   ** Merged History Encounter **       Social Determinants of Health   Financial Resource Strain:   . Difficulty of Paying Living Expenses:   Food Insecurity:   . Worried About Charity fundraiser in the Last Year:   . Arboriculturist in the Last Year:   Transportation Needs:   . Film/video editor (Medical):   Marland Kitchen Lack of Transportation (Non-Medical):   Physical Activity:   . Days of Exercise per Week:   . Minutes of Exercise per Session:   Stress:   . Feeling of Stress :  Social Connections:   . Frequency of Communication with Friends and Family:   . Frequency of Social Gatherings with Friends and Family:   . Attends Religious Services:   . Active Member of Clubs or Organizations:   . Attends Archivist Meetings:   Marland Kitchen Marital Status:   Intimate Partner Violence:   . Fear of Current or Ex-Partner:   . Emotionally Abused:   Marland Kitchen Physically Abused:   . Sexually Abused:      Allergies  Allergen Reactions  . Ace Inhibitors Swelling    Swelling of the tongue  . Influenza Vaccines Hives and Swelling    SWELLING REACTION UNSPECIFIED   . Ketorolac Swelling    SWELLING REACTION UNSPECIFIED   . Lidocaine Swelling    TONGUE SWELLS  . Lisinopril Swelling    TONGUE SWELLING Pt reported problem with a BP med which sounded like  lisinopril  But as of 09/19/06,pt had  tolerated altace without problem  . Penicillins Swelling    TONGUE SWELLS Has patient had a PCN reaction causing immediate rash, facial/tongue/throat swelling, SOB or lightheadedness with hypotension: Yes Has patient had a PCN reaction causing severe rash involving mucus membranes or skin necrosis: No Has patient had a PCN reaction that required hospitalization No Has patient had a PCN reaction occurring within the last 10 years: Yes If all of the above answers are "NO", then may proceed with Cephalosporin use.      Outpatient Medications Prior to Visit  Medication Sig Dispense Refill  . albuterol (PROVENTIL) (2.5 MG/3ML) 0.083% nebulizer solution Take 3 mLs (2.5 mg total) by nebulization every 6 (six) hours as needed for wheezing or shortness of breath. 75 mL 12  . albuterol (VENTOLIN HFA) 108 (90 Base) MCG/ACT inhaler Inhale 2 puffs into the lungs every 4 (four) hours as needed for wheezing or shortness of breath. 8 g 6  . amLODipine (NORVASC) 10 MG tablet Take 1 tablet (10 mg total) by mouth daily. 90 tablet 1  . aspirin EC 81 MG tablet Take 1 tablet (81 mg total) by mouth daily. 30 tablet 5  . atorvastatin (LIPITOR) 40 MG tablet Take 1 tablet (40 mg total) by mouth at bedtime. 90 tablet 1  . azithromycin (ZITHROMAX) 250 MG tablet Take 2 tablets p.o. on day 1 then 1 tablet daily 6 tablet 0  . budesonide-formoterol (SYMBICORT) 80-4.5 MCG/ACT inhaler Inhale 2 puffs into the lungs 2 (two) times daily. 1 Inhaler 6  . carvedilol (COREG) 25 MG tablet Take 1 tablet (25 mg total) by mouth 2 (two) times daily with a meal. 180 tablet 1  . Cholecalciferol (VITAMIN D-3) 125 MCG (5000 UT) TABS Take 5,000 Units by mouth daily.     . cyclobenzaprine (FLEXERIL) 10 MG tablet TAKE 1 TABLET (10 MG TOTAL) BY MOUTH AT BEDTIME. AS NEEDED FOR PAIN 30 tablet 0  . fluticasone (FLONASE) 50 MCG/ACT nasal spray Place 1 spray into both nostrils daily. 16 g 2  . hydrALAZINE (APRESOLINE) 10 MG tablet Take 2 tablets (20  mg total) by mouth 2 (two) times daily. 120 tablet 6  . isosorbide mononitrate (IMDUR) 30 MG 24 hr tablet Take 1 tablet (30 mg total) by mouth daily. 90 tablet 1  . loratadine (CLARITIN) 10 MG tablet Take 10 mg by mouth daily as needed for allergies.    Marland Kitchen torsemide (DEMADEX) 20 MG tablet Take 2 tablets (40 mg total) by mouth daily. 180 tablet 1  . calcitRIOL (ROCALTROL) 0.5 MCG capsule Take 0.5  mcg by mouth daily.   11  . EPINEPHrine 0.3 mg/0.3 mL IJ SOAJ injection Inject 0.3 mLs (0.3 mg total) into the muscle once as needed for anaphylaxis. (Patient not taking: Reported on 04/07/2020) 1 each 1   No facility-administered medications prior to visit.      Review of Systems  Constitutional: Negative for fatigue and fever.  HENT: Negative.   Respiratory: Positive for cough, chest tightness, shortness of breath and wheezing.   Cardiovascular: Negative for chest pain, palpitations and leg swelling.  Gastrointestinal: Negative.   Genitourinary: Negative for difficulty urinating, dysuria, enuresis, flank pain and frequency.  Musculoskeletal: Negative.   Neurological: Negative.   Psychiatric/Behavioral: Negative.        Objective:   Physical Exam Vitals:   04/07/20 0902  BP: 139/86  Pulse: 78  Resp: 19  Temp: 97.8 F (36.6 C)  SpO2: 96%  Weight: 258 lb (117 kg)  Height: 5' 7" (1.702 m)    Gen: Pleasant, well-nourished, in no distress,  normal affect  ENT: No lesions,  mouth clear,  oropharynx clear, no postnasal drip  Neck: No JVD, no TMG, no carotid bruits  Lungs: No use of accessory muscles, no dullness to percussion, clear without rales or rhonchi  Cardiovascular: RRR, heart sounds normal, no murmur or gallops, no peripheral edema  Abdomen: soft and NT, no HSM,  BS normal  Musculoskeletal: No deformities, no cyanosis or clubbing  Neuro: alert, non focal  Skin: Warm, no lesions or rashes  BMP Latest Ref Rng & Units 12/11/2019 11/26/2019 07/23/2019  Glucose 70 - 99 mg/dL  125(H) 150(H) 108(H)  BUN 6 - 20 mg/dL 67(H) 54(H) 62(H)  Creatinine 0.61 - 1.24 mg/dL 7.13(H) 7.40(H) 7.56(H)  BUN/Creat Ratio 9 - 20 - - -  Sodium 135 - 145 mmol/L 142 142 142  Potassium 3.5 - 5.1 mmol/L 3.8 3.7 3.7  Chloride 98 - 111 mmol/L 109 111 111  CO2 22 - 32 mmol/L 19(L) 20(L) 20(L)  Calcium 8.9 - 10.3 mg/dL 8.6(L) 8.7(L) 8.2(L)   Hepatic Function Latest Ref Rng & Units 11/26/2019 07/23/2019 03/04/2016  Total Protein 6.5 - 8.1 g/dL 6.8 6.4(L) 7.4  Albumin 3.5 - 5.0 g/dL 3.4(L) 3.2(L) 2.9(L)  AST 15 - 41 U/L _0 ALT 0 - 44 U/L _1 Alk Phosphatase 38 - 126 U/L 74 59 131(H)  Total Bilirubin 0.3 - 1.2 mg/dL 1.1 0.7 0.7  Bilirubin, Direct 0.1 - 0.5 mg/dL - - -   CBC Latest Ref Rng & Units 12/11/2019 11/26/2019 07/23/2019  WBC 4.0 - 10.5 K/uL 6.1 6.2 7.1  Hemoglobin 13.0 - 17.0 g/dL 11.0(L) 11.1(L) 10.0(L)  Hematocrit 39.0 - 52.0 % 35.0(L) 34.6(L) 32.2(L)  Platelets 150 - 400 K/uL 208 197 170    CXR NAD 03/2020      Assessment & Plan:  I personally reviewed all images and lab data in the [facility] system as well as any outside material available during this office visit and agree with the  radiology impressions.   Asthma Severe persistent asthma with acute exacerbation and to clarify no evidence of COPD on this exam  Plan will be to pulse prednisone 40 mg a day for 5 days and the patient will continue his current course of azithromycin  I reinstructed the patient as to the proper use of his inhalers and he improved his technique by 75%  In order to clarify the patient's pulmonary status full set of pulmonary function studies will be obtained  Obstructive sleep apnea Obstructive sleep apnea stable at this time on CPAP therapy  CKD (chronic kidney disease) stage 5, GFR less than 15 ml/min (HCC) CKD stage V with extremely low GFR  Currently being evaluated for transplantation   Amato was seen today for shortness of breath.  Diagnoses and all orders for this  visit:  Severe persistent asthma with acute exacerbation -     Pulmonary Function Test; Future  Encounter for preoperative screening laboratory testing for COVID-19 virus -     Novel Coronavirus, NAA ([facility])  Obstructive sleep apnea  CKD (chronic kidney disease) stage 5, GFR less than 15 ml/min (HCC)  Other orders -     predniSONE (DELTASONE) 10 MG tablet; Take 4 tablets daily for 5 days then stop

## 2020-04-07 NOTE — Assessment & Plan Note (Addendum)
Severe persistent asthma with acute exacerbation and to clarify no evidence of COPD on this exam  Plan will be to pulse prednisone 40 mg a day for 5 days and the patient will continue his current course of azithromycin  I reinstructed the patient as to the proper use of his inhalers and he improved his technique by 75%  In order to clarify the patient's pulmonary status full set of pulmonary function studies will be obtained

## 2020-04-07 NOTE — Assessment & Plan Note (Signed)
Obstructive sleep apnea stable at this time on CPAP therapy

## 2020-04-07 NOTE — Patient Instructions (Signed)
Finish your antibiotic azithromycin  Begin prednisone 10mg  : 4 daily for 5 days  Use Symbicort two puff twice daily  Remember the proper inhaler technique we discussed  Obtain a pulmonary function test , you will need to obtain a Covid test at least three days before the lung function testing  Return Dr Joya Gaskins one month for Video visit

## 2020-04-07 NOTE — Progress Notes (Signed)
Referred by PCP for SOB / Stated to get worsen past few days

## 2020-04-07 NOTE — Assessment & Plan Note (Signed)
CKD stage V with extremely low GFR  Currently being evaluated for transplantation

## 2020-04-14 ENCOUNTER — Other Ambulatory Visit: Payer: Self-pay | Admitting: Nephrology

## 2020-04-15 ENCOUNTER — Other Ambulatory Visit: Payer: Self-pay

## 2020-04-15 ENCOUNTER — Other Ambulatory Visit: Payer: Self-pay | Admitting: Nephrology

## 2020-04-15 ENCOUNTER — Ambulatory Visit (INDEPENDENT_AMBULATORY_CARE_PROVIDER_SITE_OTHER): Payer: BC Managed Care – PPO | Admitting: Emergency Medicine

## 2020-04-15 DIAGNOSIS — I255 Ischemic cardiomyopathy: Secondary | ICD-10-CM

## 2020-04-15 DIAGNOSIS — N2889 Other specified disorders of kidney and ureter: Secondary | ICD-10-CM

## 2020-04-15 LAB — CUP PACEART INCLINIC DEVICE CHECK
Date Time Interrogation Session: 20210511102127
Implantable Lead Implant Date: 20150507
Implantable Lead Location: 753862
Implantable Lead Model: 3400
Implantable Pulse Generator Implant Date: 20150507
Pulse Gen Model: 1010
Pulse Gen Serial Number: 14970

## 2020-04-15 NOTE — Progress Notes (Signed)
Subcutaneous ICD check in clinic. 0 untreated episodes; 0 treated episodes; 0 shocks delivered. Electrode impedance status okay. No programming changes. Remaining longevity to ERI 7%. Per Industry recommendation will return for in -clinic check on 06/17/20 to check battery status. See scanned in repiort.

## 2020-04-21 ENCOUNTER — Other Ambulatory Visit: Payer: BC Managed Care – PPO

## 2020-04-22 MED FILL — ISOSORBIDE MN ER 30 MG TAB: 30 | 30 days supply | Qty: 30 | Fill #7

## 2020-04-23 ENCOUNTER — Ambulatory Visit
Admission: RE | Admit: 2020-04-23 | Discharge: 2020-04-23 | Disposition: A | Discharge status: Home / Self Care | Payer: BC Managed Care – PPO | Source: Ambulatory Visit | Attending: Nephrology | Admitting: Nephrology

## 2020-04-23 DIAGNOSIS — N2889 Other specified disorders of kidney and ureter: Secondary | ICD-10-CM

## 2020-04-24 ENCOUNTER — Encounter (HOSPITAL_COMMUNITY): Payer: BC Managed Care – PPO

## 2020-05-02 ENCOUNTER — Other Ambulatory Visit: Payer: Self-pay | Admitting: Pharmacist

## 2020-05-02 ENCOUNTER — Telehealth: Payer: Self-pay | Admitting: Internal Medicine

## 2020-05-02 DIAGNOSIS — E785 Hyperlipidemia, unspecified: Secondary | ICD-10-CM

## 2020-05-02 DIAGNOSIS — I2510 Atherosclerotic heart disease of native coronary artery without angina pectoris: Secondary | ICD-10-CM

## 2020-05-02 MED ORDER — ATORVASTATIN CALCIUM 40 MG PO TABS: 40.0000 mg | ORAL_TABLET | Freq: Every day | ORAL | 1 refills | Status: DC

## 2020-05-02 MED FILL — ATORVASTATIN CALCIUM 40 MG: 40 | 30 days supply | Qty: 30 | Fill #0

## 2020-05-02 NOTE — Telephone Encounter (Signed)
1) Medication(s) Requested (by name): atorvastatin (LIPITOR) 40 MG tablet   2) Pharmacy of Choice:  3) Special Requests:   Approved medications will be sent to the pharmacy, we will reach out if there is an issue.  Requests made after 3pm may not be addressed until the following business day!  If a patient is unsure of the name of the medication(s) please note and ask patient to call back when they are able to provide all info, do not send to responsible party until all information is available!

## 2020-05-13 ENCOUNTER — Other Ambulatory Visit (HOSPITAL_COMMUNITY): Payer: BC Managed Care – PPO

## 2020-05-15 ENCOUNTER — Encounter (HOSPITAL_COMMUNITY): Payer: BC Managed Care – PPO

## 2020-05-16 ENCOUNTER — Other Ambulatory Visit: Payer: Self-pay | Admitting: Internal Medicine

## 2020-05-27 ENCOUNTER — Other Ambulatory Visit (HOSPITAL_COMMUNITY): Payer: BC Managed Care – PPO

## 2020-05-28 ENCOUNTER — Encounter (HOSPITAL_COMMUNITY): Payer: BC Managed Care – PPO

## 2020-05-30 ENCOUNTER — Other Ambulatory Visit (HOSPITAL_COMMUNITY)
Admission: RE | Admit: 2020-05-30 | Discharge: 2020-05-30 | Disposition: A | Discharge status: Home / Self Care | Payer: BC Managed Care – PPO | Source: Ambulatory Visit | Attending: Critical Care Medicine | Admitting: Critical Care Medicine

## 2020-05-30 DIAGNOSIS — Z01812 Encounter for preprocedural laboratory examination: Secondary | ICD-10-CM | POA: Insufficient documentation

## 2020-05-30 DIAGNOSIS — Z20822 Contact with and (suspected) exposure to covid-19: Secondary | ICD-10-CM | POA: Insufficient documentation

## 2020-05-30 LAB — SARS CORONAVIRUS 2 (TAT 6-24 HRS): SARS Coronavirus 2: NEGATIVE

## 2020-06-02 ENCOUNTER — Other Ambulatory Visit: Payer: Self-pay

## 2020-06-02 ENCOUNTER — Ambulatory Visit (HOSPITAL_COMMUNITY)
Admission: RE | Admit: 2020-06-02 | Discharge: 2020-06-02 | Disposition: A | Discharge status: Home / Self Care | Payer: BC Managed Care – PPO | Source: Ambulatory Visit | Attending: Critical Care Medicine | Admitting: Critical Care Medicine

## 2020-06-02 DIAGNOSIS — J4551 Severe persistent asthma with (acute) exacerbation: Secondary | ICD-10-CM | POA: Insufficient documentation

## 2020-06-02 LAB — PULMONARY FUNCTION TEST
DL/VA % pred: 83 %
DL/VA: 3.63 ml/min/mmHg/L
DLCO unc % pred: 48 %
DLCO unc: 12.59 ml/min/mmHg
FEF 25-75 Post: 1.36 L/sec
FEF 25-75 Pre: 1.04 L/sec
FEF2575-%Change-Post: 30 %
FEF2575-%Pred-Post: 48 %
FEF2575-%Pred-Pre: 37 %
FEV1-%Change-Post: 8 %
FEV1-%Pred-Post: 61 %
FEV1-%Pred-Pre: 56 %
FEV1-Post: 1.76 L
FEV1-Pre: 1.63 L
FEV1FVC-%Change-Post: 5 %
FEV1FVC-%Pred-Pre: 89 %
FEV6-%Change-Post: 3 %
FEV6-%Pred-Post: 66 %
FEV6-%Pred-Pre: 64 %
FEV6-Post: 2.36 L
FEV6-Pre: 2.28 L
FEV6FVC-%Change-Post: 1 %
FEV6FVC-%Pred-Post: 103 %
FEV6FVC-%Pred-Pre: 102 %
FVC-%Change-Post: 2 %
FVC-%Pred-Post: 64 %
FVC-%Pred-Pre: 63 %
FVC-Post: 2.36 L
FVC-Pre: 2.31 L
Post FEV1/FVC ratio: 75 %
Post FEV6/FVC ratio: 100 %
Pre FEV1/FVC ratio: 71 %
Pre FEV6/FVC Ratio: 99 %
RV % pred: 77 %
RV: 1.55 L
TLC % pred: 62 %
TLC: 3.97 L

## 2020-06-02 MED ORDER — ALBUTEROL SULFATE (2.5 MG/3ML) 0.083% IN NEBU
2.5000 mg | INHALATION_SOLUTION | Freq: Once | RESPIRATORY_TRACT | Status: AC
  Administered 2020-06-02: 2.5 mg via RESPIRATORY_TRACT

## 2020-06-04 ENCOUNTER — Other Ambulatory Visit: Payer: Self-pay | Admitting: Critical Care Medicine

## 2020-06-04 MED ORDER — BUDESONIDE-FORMOTEROL FUMARATE 80-4.5 MCG/ACT IN AERO: 2.0000 | INHALATION_SPRAY | Freq: Two times a day (BID) | RESPIRATORY_TRACT | 6 refills | Status: DC

## 2020-06-04 MED FILL — SYMBICORT 80-4.5 MCG INH: 80-4.5 | 30 days supply | Qty: 10 | Fill #0

## 2020-06-13 MED FILL — ISOSORBIDE MN ER 30 MG TAB: 30 | 90 days supply | Qty: 90 | Fill #0

## 2020-06-13 MED FILL — TORSEMIDE 20 MG TABLET: 20 | 90 days supply | Qty: 180 | Fill #1

## 2020-06-14 ENCOUNTER — Observation Stay (HOSPITAL_COMMUNITY)
Admission: EM | Admit: 2020-06-14 | Discharge: 2020-06-15 | Disposition: A | Discharge status: Home / Self Care | Payer: BC Managed Care – PPO | Attending: Family Medicine | Admitting: Family Medicine

## 2020-06-14 ENCOUNTER — Ambulatory Visit (INDEPENDENT_AMBULATORY_CARE_PROVIDER_SITE_OTHER)
Admission: EM | Admit: 2020-06-14 | Discharge: 2020-06-14 | Disposition: A | Discharge status: Home / Self Care | Payer: BC Managed Care – PPO | Source: Home / Self Care

## 2020-06-14 ENCOUNTER — Emergency Department (HOSPITAL_COMMUNITY): Payer: BC Managed Care – PPO

## 2020-06-14 ENCOUNTER — Other Ambulatory Visit: Payer: Self-pay

## 2020-06-14 ENCOUNTER — Encounter (HOSPITAL_COMMUNITY): Payer: Self-pay | Admitting: *Deleted

## 2020-06-14 DIAGNOSIS — G4733 Obstructive sleep apnea (adult) (pediatric): Secondary | ICD-10-CM | POA: Diagnosis not present

## 2020-06-14 DIAGNOSIS — N289 Disorder of kidney and ureter, unspecified: Secondary | ICD-10-CM

## 2020-06-14 DIAGNOSIS — E785 Hyperlipidemia, unspecified: Secondary | ICD-10-CM | POA: Diagnosis not present

## 2020-06-14 DIAGNOSIS — I5023 Acute on chronic systolic (congestive) heart failure: Secondary | ICD-10-CM

## 2020-06-14 DIAGNOSIS — Z20822 Contact with and (suspected) exposure to covid-19: Secondary | ICD-10-CM | POA: Diagnosis not present

## 2020-06-14 DIAGNOSIS — R0602 Shortness of breath: Secondary | ICD-10-CM

## 2020-06-14 DIAGNOSIS — N185 Chronic kidney disease, stage 5: Secondary | ICD-10-CM | POA: Diagnosis not present

## 2020-06-14 DIAGNOSIS — I132 Hypertensive heart and chronic kidney disease with heart failure and with stage 5 chronic kidney disease, or end stage renal disease: Principal | ICD-10-CM | POA: Insufficient documentation

## 2020-06-14 DIAGNOSIS — Z95810 Presence of automatic (implantable) cardiac defibrillator: Secondary | ICD-10-CM | POA: Diagnosis not present

## 2020-06-14 DIAGNOSIS — J45909 Unspecified asthma, uncomplicated: Secondary | ICD-10-CM | POA: Diagnosis present

## 2020-06-14 DIAGNOSIS — I2510 Atherosclerotic heart disease of native coronary artery without angina pectoris: Secondary | ICD-10-CM | POA: Insufficient documentation

## 2020-06-14 DIAGNOSIS — Z79899 Other long term (current) drug therapy: Secondary | ICD-10-CM | POA: Diagnosis not present

## 2020-06-14 DIAGNOSIS — I5043 Acute on chronic combined systolic (congestive) and diastolic (congestive) heart failure: Secondary | ICD-10-CM | POA: Diagnosis not present

## 2020-06-14 DIAGNOSIS — I509 Heart failure, unspecified: Secondary | ICD-10-CM

## 2020-06-14 DIAGNOSIS — Z7982 Long term (current) use of aspirin: Secondary | ICD-10-CM | POA: Diagnosis not present

## 2020-06-14 DIAGNOSIS — R609 Edema, unspecified: Secondary | ICD-10-CM

## 2020-06-14 DIAGNOSIS — I5042 Chronic combined systolic (congestive) and diastolic (congestive) heart failure: Secondary | ICD-10-CM

## 2020-06-14 DIAGNOSIS — I10 Essential (primary) hypertension: Secondary | ICD-10-CM | POA: Diagnosis present

## 2020-06-14 HISTORY — DX: Acute on chronic systolic (congestive) heart failure: I50.23

## 2020-06-14 LAB — CBC
HCT: 32.5 % — ABNORMAL LOW (ref 39.0–52.0)
Hemoglobin: 10.2 g/dL — ABNORMAL LOW (ref 13.0–17.0)
MCH: 28.7 pg (ref 26.0–34.0)
MCHC: 31.4 g/dL (ref 30.0–36.0)
MCV: 91.5 fL (ref 80.0–100.0)
Platelets: 201 10*3/uL (ref 150–400)
RBC: 3.55 MIL/uL — ABNORMAL LOW (ref 4.22–5.81)
RDW: 17.2 % — ABNORMAL HIGH (ref 11.5–15.5)
WBC: 7.8 10*3/uL (ref 4.0–10.5)
nRBC: 0 % (ref 0.0–0.2)

## 2020-06-14 LAB — BASIC METABOLIC PANEL
Anion gap: 14 (ref 5–15)
BUN: 70 mg/dL — ABNORMAL HIGH (ref 6–20)
CO2: 18 mmol/L — ABNORMAL LOW (ref 22–32)
Calcium: 8.3 mg/dL — ABNORMAL LOW (ref 8.9–10.3)
Chloride: 112 mmol/L — ABNORMAL HIGH (ref 98–111)
Creatinine, Ser: 8.35 mg/dL — ABNORMAL HIGH (ref 0.61–1.24)
GFR calc Af Amer: 7 mL/min — ABNORMAL LOW (ref >60)
GFR calc non Af Amer: 6 mL/min — ABNORMAL LOW (ref >60)
Glucose, Bld: 94 mg/dL (ref 70–99)
Potassium: 3.9 mmol/L (ref 3.5–5.1)
Sodium: 144 mmol/L (ref 135–145)

## 2020-06-14 LAB — TROPONIN I (HIGH SENSITIVITY)
Troponin I (High Sensitivity): 111 ng/L (ref <18)
Troponin I (High Sensitivity): 114 ng/L (ref <18)

## 2020-06-14 LAB — SARS CORONAVIRUS 2 BY RT PCR (HOSPITAL ORDER, PERFORMED IN ~~LOC~~ HOSPITAL LAB): SARS Coronavirus 2: NEGATIVE

## 2020-06-14 MED ORDER — FUROSEMIDE 10 MG/ML IJ SOLN
40.0000 mg | Freq: Once | INTRAMUSCULAR | Status: AC
  Administered 2020-06-15: 40 mg via INTRAVENOUS
  Filled 2020-06-14: qty 4

## 2020-06-14 MED ORDER — ATORVASTATIN CALCIUM 40 MG PO TABS
40.0000 mg | ORAL_TABLET | Freq: Every day | ORAL | Status: DC
  Administered 2020-06-15: 40 mg via ORAL
  Filled 2020-06-14: qty 1

## 2020-06-14 MED ORDER — AMLODIPINE BESYLATE 10 MG PO TABS
10.0000 mg | ORAL_TABLET | Freq: Every day | ORAL | Status: DC
  Administered 2020-06-15: 10 mg via ORAL
  Filled 2020-06-14: qty 1

## 2020-06-14 MED ORDER — SODIUM CHLORIDE 0.9% FLUSH
3.0000 mL | Freq: Once | INTRAVENOUS | Status: AC
  Administered 2020-06-14: 3 mL via INTRAVENOUS

## 2020-06-14 MED ORDER — ALBUTEROL SULFATE HFA 108 (90 BASE) MCG/ACT IN AERS
8.0000 | INHALATION_SPRAY | Freq: Once | RESPIRATORY_TRACT | Status: AC
  Administered 2020-06-14: 8 via RESPIRATORY_TRACT
  Filled 2020-06-14: qty 6.7

## 2020-06-14 MED ORDER — CARVEDILOL 25 MG PO TABS
25.0000 mg | ORAL_TABLET | Freq: Two times a day (BID) | ORAL | Status: DC
  Administered 2020-06-15: 25 mg via ORAL
  Filled 2020-06-14 (×2): qty 1

## 2020-06-14 MED ORDER — ASPIRIN EC 81 MG PO TBEC
81.0000 mg | DELAYED_RELEASE_TABLET | Freq: Every day | ORAL | Status: DC
  Administered 2020-06-15: 81 mg via ORAL
  Filled 2020-06-14: qty 1

## 2020-06-14 MED ORDER — MOMETASONE FURO-FORMOTEROL FUM 100-5 MCG/ACT IN AERO
2.0000 | INHALATION_SPRAY | Freq: Two times a day (BID) | RESPIRATORY_TRACT | Status: DC
  Administered 2020-06-15: 2 via RESPIRATORY_TRACT
  Filled 2020-06-14: qty 8.8

## 2020-06-14 MED ORDER — ALBUTEROL SULFATE (2.5 MG/3ML) 0.083% IN NEBU: 2.5000 mg | INHALATION_SOLUTION | Freq: Four times a day (QID) | RESPIRATORY_TRACT | Status: DC | PRN

## 2020-06-14 MED ORDER — HYDRALAZINE HCL 10 MG PO TABS
20.0000 mg | ORAL_TABLET | Freq: Two times a day (BID) | ORAL | Status: DC
  Administered 2020-06-15 (×2): 20 mg via ORAL
  Filled 2020-06-14 (×3): qty 2

## 2020-06-14 MED ORDER — ACETAMINOPHEN 325 MG PO TABS: 650.0000 mg | ORAL_TABLET | ORAL | Status: DC | PRN

## 2020-06-14 MED ORDER — ONDANSETRON HCL 4 MG/2ML IJ SOLN: 4.0000 mg | Freq: Four times a day (QID) | INTRAMUSCULAR | Status: DC | PRN

## 2020-06-14 MED ORDER — HEPARIN SODIUM (PORCINE) 5000 UNIT/ML IJ SOLN
5000.0000 [IU] | Freq: Three times a day (TID) | INTRAMUSCULAR | Status: DC
  Filled 2020-06-14: qty 1

## 2020-06-14 MED ORDER — SODIUM CHLORIDE 0.9% FLUSH
3.0000 mL | Freq: Two times a day (BID) | INTRAVENOUS | Status: DC
  Administered 2020-06-15 (×2): 3 mL via INTRAVENOUS

## 2020-06-14 MED ORDER — ISOSORBIDE MONONITRATE ER 30 MG PO TB24
30.0000 mg | ORAL_TABLET | Freq: Every day | ORAL | Status: DC
  Administered 2020-06-15: 30 mg via ORAL
  Filled 2020-06-14: qty 1

## 2020-06-14 MED ORDER — SODIUM CHLORIDE 0.9 % IV SOLN: 250.0000 mL | INTRAVENOUS | Status: DC | PRN

## 2020-06-14 MED ORDER — SODIUM CHLORIDE 0.9% FLUSH: 3.0000 mL | INTRAVENOUS | Status: DC | PRN

## 2020-06-14 MED ORDER — ALBUTEROL SULFATE (2.5 MG/3ML) 0.083% IN NEBU
5.0000 mg | INHALATION_SOLUTION | Freq: Once | RESPIRATORY_TRACT | Status: AC
  Administered 2020-06-14: 5 mg via RESPIRATORY_TRACT
  Filled 2020-06-14: qty 6

## 2020-06-14 NOTE — ED Provider Notes (Addendum)
[place] EMERGENCY DEPARTMENT Provider Note   CSN: [id number] Arrival date & time: 06/14/20  1734     History Chief Complaint  Patient presents with  . Shortness of Breath    Victor Thompson is a 59 y.o. male.  The history is provided by the patient. No language interpreter was used.  Shortness of Breath Severity:  Moderate Onset quality:  Gradual Duration:  1 day Timing:  Constant Progression:  Worsening Chronicity:  Recurrent Context: activity   Relieved by:  Nothing Worsened by:  Nothing Ineffective treatments:  None tried Associated symptoms: no syncope    Pt complains of increased shortness of breath today.  Pt reports no relief with nebulizer or Symbicort.  Pt reports chest feels tight  Pt has a history of CHF, and stage 5 kidney disease.  Pt is being seen at [facility] for possible transplant.  Pt reports he has missed dosages of his medication.  Pt reports legs are more swollen than usual.   Pt has a defibrillator.  It has not fired   Past Medical History:  Diagnosis Date  . AICD (automatic cardioverter/defibrillator) present    2015  . Asthma    no meds  . Cardiac arrest ([place]) 2015  . CHF (congestive heart failure) ([place])   . Chronic kidney disease    ckd -stage 5  . Coronary artery disease   . Hypertension   . Myocardial infarction ([place])   . Obesity   . Pneumonia   . Pulmonary edema   . Shortness of breath dyspnea   . Sleep apnea    USES CPAP  . Wears glasses     Patient Active Problem List   Diagnosis Date Noted  . Secondary hyperparathyroidism of renal origin ([place]) 07/15/2019  . Vitamin D deficiency 07/15/2019  . CAD (coronary artery disease), native coronary artery 04/10/2019  . CKD (chronic kidney disease) stage 5, GFR less than 15 ml/min (HCC) 09/26/2018  . Chronic midline low back pain without sciatica 02/17/2018  . VT (ventricular tachycardia) ([place]) 03/29/2016  . Defibrillator discharge   . Chronic combined systolic and  diastolic CHF (congestive heart failure) ([place]) 01/22/2016  . Erectile dysfunction 01/22/2016  . Allergic rhinitis 01/22/2016  . GERD (gastroesophageal reflux disease) 01/22/2016  . PROTEINURIA 02/17/2010  . Cardiomyopathy, ischemic 02/16/2010  . Obstructive sleep apnea 02/16/2010  . EXTERNAL HEMORRHOIDS 09/26/2009  . Tobacco use disorder in remission 06/03/2009  . Essential hypertension 10/01/2008  . Hyperlipidemia 07/14/2007  . Class 3 severe obesity due to excess calories with serious comorbidity and body mass index (BMI) of 40.0 to 44.9 in adult ([place]) 07/14/2007  . Asthma 07/14/2007    Past Surgical History:  Procedure Laterality Date  . AV FISTULA PLACEMENT Right 03/15/2019   Procedure: RIGHT ARM ARTERIOVENOUS (AV) FISTULA CREATION;  Surgeon: Angelia Mould, MD;  Location: [place];  Service: Vascular;  Laterality: Right;  . COLONOSCOPY    . COLONOSCOPY W/ BIOPSIES AND POLYPECTOMY    . CORONARY STENT PLACEMENT  2007  . IMPLANTABLE CARDIOVERTER DEFIBRILLATOR IMPLANT  2015       Family History  Problem Relation Age of Onset  . Hypertension Mother   . Liver disease Father     Social History   Tobacco Use  . Smoking status: Never Smoker  . Smokeless tobacco: Never Used  Vaping Use  . Vaping Use: Never used  Substance Use Topics  . Alcohol use: No  . Drug use: No    Home Medications Prior  to Admission medications   Medication Sig Start Date End Date Taking? Authorizing Provider  albuterol (PROVENTIL) (2.5 MG/3ML) 0.083% nebulizer solution Take 3 mLs (2.5 mg total) by nebulization every 6 (six) hours as needed for wheezing or shortness of breath. 10/01/19   [name], MD  albuterol (VENTOLIN HFA) 108 (90 Base) MCG/ACT inhaler Inhale 2 puffs into the lungs every 4 (four) hours as needed for wheezing or shortness of breath. 10/01/19   [name], MD  amLODipine (NORVASC) 10 MG tablet Take 1 tablet (10 mg total) by mouth daily. 03/24/20   [name], MD  aspirin EC 81 MG tablet Take 1 tablet (81 mg total) by mouth daily. 01/22/16   [name], [name], MD  atorvastatin (LIPITOR) 40 MG tablet Take 1 tablet (40 mg total) by mouth at bedtime. 05/02/20   [name], MD  azithromycin (ZITHROMAX) 250 MG tablet Take 2 tablets p.o. on day 1 then 1 tablet daily 03/24/20   [name], MD  budesonide-formoterol [facility]) 80-4.5 MCG/ACT inhaler Inhale 2 puffs into the lungs 2 (two) times daily. 06/04/20   [name], MD  calcitRIOL (ROCALTROL) 0.5 MCG capsule Take 0.5 mcg by mouth daily.  08/16/18   [provider]  carvedilol (COREG) 25 MG tablet Take 1 tablet (25 mg total) by mouth 2 (two) times daily with a meal. 10/01/19   [name], MD  Cholecalciferol (VITAMIN D-3) 125 MCG (5000 UT) TABS Take 5,000 Units by mouth daily.     [provider]  cyclobenzaprine (FLEXERIL) 10 MG tablet TAKE 1 TABLET (10 MG TOTAL) BY MOUTH AT BEDTIME. AS NEEDED FOR PAIN 12/21/18   [name], MD  EPINEPHrine 0.3 mg/0.3 mL IJ SOAJ injection Inject 0.3 mLs (0.3 mg total) into the muscle once as needed for anaphylaxis. 03/24/20   [name], MD  fluticasone (FLONASE) 50 MCG/ACT nasal spray Place 1 spray into both nostrils daily. 12/31/18   Volanda Napoleon, PA-C  hydrALAZINE (APRESOLINE) 10 MG tablet Take 2 tablets (20 mg total) by mouth 2 (two) times daily. 10/01/19   [name], MD  isosorbide mononitrate (IMDUR) 30 MG 24 hr tablet Take 1 tablet (30 mg total) by mouth daily. 10/01/19   [name], MD  loratadine (CLARITIN) 10 MG tablet Take 10 mg by mouth daily as needed for allergies.    [provider]  predniSONE (DELTASONE) 10 MG tablet Take 4 tablets daily for 5 days then stop 04/07/20   [name], MD  torsemide (DEMADEX) 20 MG tablet Take 2 tablets (40 mg total) by mouth daily. 10/01/19   [name], MD    Allergies    Ace inhibitors, Influenza vaccines,  Ketorolac, Lidocaine, Lisinopril, and Penicillins  Review of Systems   Review of Systems  Respiratory: Positive for shortness of breath.   Cardiovascular: Negative for syncope.  All other systems reviewed and are negative.   Physical Exam Updated Vital Signs BP (!) 145/86 (BP Location: Left Arm)   Pulse 90   Temp 98.8 F (37.1 C) (Oral)   Resp 16   Ht 5\' 7"  (1.702 m)   Wt 122.5 kg   SpO2 98%   BMI 42.29 kg/m   Physical Exam Vitals and nursing note reviewed.  Constitutional:      Appearance: He is well-developed.  HENT:     Head: Normocephalic.  Cardiovascular:     Rate and Rhythm: Normal rate and regular rhythm.  Pulmonary:     Effort: Pulmonary effort is normal.  Abdominal:     General: There is no distension.  Musculoskeletal:        General: Normal range of motion.     Cervical back: Normal range of motion.     Right lower leg: Edema present.     Left lower leg: Edema present.  Skin:    General: Skin is warm.  Neurological:     General: No focal deficit present.     Mental Status: He is alert and oriented to person, place, and time.  Psychiatric:        Mood and Affect: Mood normal.     ED Results / Procedures / Treatments   Labs (all labs ordered are listed, but only abnormal results are displayed) Labs Reviewed  BASIC METABOLIC PANEL - Abnormal; Notable for the following components:      Result Value   Chloride 112 (*)    CO2 18 (*)    BUN 70 (*)    Creatinine, Ser 8.35 (*)    Calcium 8.3 (*)    GFR calc non Af Amer 6 (*)    GFR calc Af Amer 7 (*)    All other components within normal limits  CBC - Abnormal; Notable for the following components:   RBC 3.55 (*)    Hemoglobin 10.2 (*)    HCT 32.5 (*)    RDW 17.2 (*)    All other components within normal limits  TROPONIN I (HIGH SENSITIVITY) - Abnormal; Notable for the following components:   Troponin I (High Sensitivity) 111 (*)    All other components within normal limits  TROPONIN I (HIGH  SENSITIVITY) - Abnormal; Notable for the following components:   Troponin I (High Sensitivity) 114 (*)    All other components within normal limits  SARS CORONAVIRUS 2 BY RT PCR (HOSPITAL ORDER, [facility])    EKG None  Radiology DG Chest 2 View  Result Date: 06/14/2020 CLINICAL DATA:  Shortness of breath. EXAM: CHEST - 2 VIEW COMPARISON:  December 11, 2019 FINDINGS: Stable cardiomegaly. Stable generator device over the left lateral chest. The hila and mediastinum are unchanged. No pneumothorax. No nodules or masses. No focal infiltrates. No overt edema. IMPRESSION: No active cardiopulmonary disease. Electronically Signed   By: Dorise Bullion III M.D   On: 06/14/2020 18:53    Procedures .Critical Care Performed by: Fransico Meadow, PA-C Authorized by: Fransico Meadow, PA-C   Critical care provider statement:    Critical care time (minutes):  45   Critical care start time:  06/14/2020 8:00 PM   Critical care end time:  06/14/2020 11:00 PM   Critical care time was exclusive of:  Separately billable procedures and treating other patients and teaching time   Critical care was necessary to treat or prevent imminent or life-threatening deterioration of the following conditions:  Cardiac failure, circulatory failure and CNS failure or compromise   Critical care was time spent personally by me on the following activities:  Discussions with consultants, evaluation of patient's response to treatment, examination of patient, ordering and performing treatments and interventions, ordering and review of laboratory studies, ordering and review of radiographic studies, pulse oximetry, re-evaluation of patient's condition, obtaining history from patient or surrogate and review of old charts   I assumed direction of critical care for this patient from another provider in my specialty: no     (including critical care time)  Medications  Ordered in ED Medications  sodium chloride  flush (NS) 0.9 % injection 3 mL (3 mLs Intravenous Given 06/14/20 2054)  albuterol (VENTOLIN HFA) 108 (90 Base) MCG/ACT inhaler 8 puff (8 puffs Inhalation Given 06/14/20 2055)    ED Course  I have reviewed the triage vital signs and the nursing notes.  Pertinent labs & imaging results that were available during my care of the patient were reviewed by me and considered in my medical decision making (see chart for details).    MDM Rules/Calculators/A&P                          MDM:  Pt has an increase in his troponin x 2.  Renal function has increased, Potassium is normal   Chest xray no overt edema.  I spoke to Dr. Posey Pronto who will see for admission  Final Clinical Impression(s) / ED Diagnoses Final diagnoses:  Acute congestive heart failure, unspecified heart failure type [facility])  Kidney disease    Rx / DC Orders ED Discharge Orders    None    An After Visit Summary was printed and given to the patient.    Sidney Ace 06/14/20 2253    Pattricia Boss, MD 06/16/20 1216    Fransico Meadow, PA-C 07/04/20 8563    Pattricia Boss, MD 07/12/20 [phone]

## 2020-06-14 NOTE — Discharge Instructions (Addendum)
I do think it is appropriate for you to get more complete evaluation in the ER.

## 2020-06-14 NOTE — H&P (Signed)
History and Physical    Victor Thompson OVZ:[id number] DOB: 01-02-1961 DOA: 06/14/2020  PCP: Victor Pier, MD  Patient coming from: Home  I have personally briefly reviewed patient's old medical records in [place]  Chief Complaint: Dyspnea  HPI: Victor Thompson is a 59 y.o. male with medical history significant for chronic combined systolic and diastolic CHF (EF 41-28% by echocardiogram 03/31/2020), CAD s/p PCI, ischemic cardiomyopathy, history of V. tach and cardiac arrest s/p ICD, CKD stage V, severe persistent asthma, hypertension, hyperlipidemia, and OSA who presents to the ED for evaluation of dyspnea.  Patient states he has had 1 week of progressive dyspnea.  Yesterday shortness of breath worsened and he began to have orthopnea and paroxysmal nocturnal dyspnea.  He has had dyspnea on exertion and noted some swelling in both of his lower extremities.  He has felt like he has been wheezing.  He reports good urine output.  He denies any chest pain, subjective fevers, chills, diaphoresis, abdominal pain.  ED Course:  Initial vitals showed BP 184/108, pulse 88, RR 24, temp 99.2 Fahrenheit, SPO2 96% on room air.  Labs notable for sodium 144, potassium 3.9, bicarb 18, BUN 70, creatinine 8.35, EGFR 7, serum glucose 94, WBC 7.8, hemoglobin 10.2, platelets 201,000, high-sensitivity troponin I 111 >> 114.  SARS-CoV-2 PCR is negative.  2 view chest x-ray shows left-sided ICD in place, stable cardiomegaly, no focal consolidation or infiltrates.  Patient was given albuterol inhaler and hospitalist service was consulted to admit for further evaluation management.  Review of Systems: All systems reviewed and are negative except as documented in history of present illness above.   Past Medical History:  Diagnosis Date   AICD (automatic cardioverter/defibrillator) present    2015   Asthma    no meds   Cardiac arrest ([place]) 2015   CHF (congestive heart failure) (HCC)    Chronic  kidney disease    ckd -stage 5   Coronary artery disease    Hypertension    Myocardial infarction ([place])    Obesity    Pneumonia    Pulmonary edema    Shortness of breath dyspnea    Sleep apnea    USES CPAP   Wears glasses     Past Surgical History:  Procedure Laterality Date   AV FISTULA PLACEMENT Right 03/15/2019   Procedure: RIGHT ARM ARTERIOVENOUS (AV) FISTULA CREATION;  Surgeon: Angelia Mould, MD;  Location: [place];  Service: Vascular;  Laterality: Right;   COLONOSCOPY     COLONOSCOPY W/ BIOPSIES AND POLYPECTOMY     CORONARY STENT PLACEMENT  2007   IMPLANTABLE CARDIOVERTER DEFIBRILLATOR IMPLANT  2015    Social History:  reports that he has never smoked. He has never used smokeless tobacco. He reports that he does not drink alcohol and does not use drugs.  Allergies  Allergen Reactions   Ace Inhibitors Swelling    Swelling of the tongue   Influenza Vaccines Hives and Swelling    SWELLING REACTION UNSPECIFIED    Ketorolac Swelling    SWELLING REACTION UNSPECIFIED    Lidocaine Swelling    TONGUE SWELLS   Lisinopril Swelling    TONGUE SWELLING Pt reported problem with a BP med which sounded like  lisinopril  But as of 09/19/06,pt had tolerated altace without problem   Penicillins Swelling    TONGUE SWELLS Has patient had a PCN reaction causing immediate rash, facial/tongue/throat swelling, SOB or lightheadedness with hypotension: Yes Has patient had a PCN reaction causing  severe rash involving mucus membranes or skin necrosis: No Has patient had a PCN reaction that required hospitalization No Has patient had a PCN reaction occurring within the last 10 years: Yes If all of the above answers are "NO", then may proceed with Cephalosporin use.     Family History  Problem Relation Age of Onset   Hypertension Mother    Liver disease Father      Prior to Admission medications   Medication Sig Start Date End Date Taking? Authorizing  Provider  albuterol (PROVENTIL) (2.5 MG/3ML) 0.083% nebulizer solution Take 3 mLs (2.5 mg total) by nebulization every 6 (six) hours as needed for wheezing or shortness of breath. 10/01/19  Yes Victor Pier, MD  albuterol (VENTOLIN HFA) 108 (90 Base) MCG/ACT inhaler Inhale 2 puffs into the lungs every 4 (four) hours as needed for wheezing or shortness of breath. 10/01/19  Yes Victor Pier, MD  amLODipine (NORVASC) 10 MG tablet Take 1 tablet (10 mg total) by mouth daily. 03/24/20  Yes Victor Pier, MD  aspirin EC 81 MG tablet Take 1 tablet (81 mg total) by mouth daily. 01/22/16  Yes [name], Josalyn, MD  atorvastatin (LIPITOR) 40 MG tablet Take 1 tablet (40 mg total) by mouth at bedtime. 05/02/20  Yes Victor Pier, MD  budesonide-formoterol [facility]) 80-4.5 MCG/ACT inhaler Inhale 2 puffs into the lungs 2 (two) times daily. 06/04/20  Yes [name], MD  calcitRIOL (ROCALTROL) 0.5 MCG capsule Take 0.5 mcg by mouth daily.  08/16/18  Yes [provider]  carvedilol (COREG) 25 MG tablet Take 1 tablet (25 mg total) by mouth 2 (two) times daily with a meal. 10/01/19  Yes Victor Pier, MD  Cholecalciferol (VITAMIN D-3) 125 MCG (5000 UT) TABS Take 5,000 Units by mouth daily.    Yes [provider]  EPINEPHrine 0.3 mg/0.3 mL IJ SOAJ injection Inject 0.3 mLs (0.3 mg total) into the muscle once as needed for anaphylaxis. 03/24/20  Yes Victor Pier, MD  fluticasone (FLONASE) 50 MCG/ACT nasal spray Place 1 spray into both nostrils daily. 12/31/18  Yes Providence Lanius A, PA-C  hydrALAZINE (APRESOLINE) 10 MG tablet Take 2 tablets (20 mg total) by mouth 2 (two) times daily. 10/01/19  Yes Victor Pier, MD  isosorbide mononitrate (IMDUR) 30 MG 24 hr tablet Take 1 tablet (30 mg total) by mouth daily. 10/01/19  Yes Victor Pier, MD  loratadine (CLARITIN) 10 MG tablet Take 10 mg by mouth daily as needed for allergies.   Yes [provider]   torsemide (DEMADEX) 20 MG tablet Take 2 tablets (40 mg total) by mouth daily. 10/01/19  Yes Victor Pier, MD  azithromycin (ZITHROMAX) 250 MG tablet Take 2 tablets p.o. on day 1 then 1 tablet daily Patient not taking: Reported on 06/14/2020 03/24/20   Victor Pier, MD  cyclobenzaprine (FLEXERIL) 10 MG tablet TAKE 1 TABLET (10 MG TOTAL) BY MOUTH AT BEDTIME. AS NEEDED FOR PAIN Patient not taking: Reported on 06/14/2020 12/21/18   Victor Pier, MD  predniSONE (DELTASONE) 10 MG tablet Take 4 tablets daily for 5 days then stop Patient not taking: Reported on 06/14/2020 04/07/20   [name], MD    Physical Exam: Vitals:   06/14/20 1813 06/14/20 1814  BP: (!) 145/86   Pulse: 90   Resp: 16   Temp: 98.8 F (37.1 C)   TempSrc: Oral   SpO2: 98%   Weight:  122.5 kg  Height:  $'5\' 7"'M$  (1.702 m)   Constitutional: Resting in bed with head elevated NAD, calm, comfortable Eyes: PERRL, lids and conjunctivae normal ENMT: Mucous membranes are moist. Posterior pharynx clear of any exudate or lesions.Normal dentition.  Neck: normal, supple, no masses. Respiratory: Faint bibasilar crackles, no active wheezing.  Normal respiratory effort. No accessory muscle use.  Cardiovascular: Regular rate and rhythm, no murmurs / rubs / gallops.  +1 bilateral lower extremity edema. 2+ pedal pulses. Abdomen: no tenderness, no masses palpated. No hepatosplenomegaly. Bowel sounds positive.  Musculoskeletal: no clubbing / cyanosis. No joint deformity upper and lower extremities. Good ROM, no contractures. Normal muscle tone.  Skin: no rashes, lesions, ulcers. No induration Neurologic: CN 2-12 grossly intact. Sensation intact, Strength 5/5 in all 4.  Psychiatric: Normal judgment and insight. Alert and oriented x 3. Normal mood.     Labs on Admission: I have personally reviewed following labs and imaging studies  CBC: Recent Labs  Lab 06/14/20 1818  WBC 7.8  HGB 10.2*  HCT 32.5*  MCV 91.5  PLT  269   Basic Metabolic Panel: Recent Labs  Lab 06/14/20 1818  NA 144  K 3.9  CL 112*  CO2 18*  GLUCOSE 94  BUN 70*  CREATININE 8.35*  CALCIUM 8.3*   GFR: Estimated Creatinine Clearance: 12.4 mL/min (A) (by C-G formula based on SCr of 8.35 mg/dL (H)). Liver Function Tests: No results for input(s): AST, ALT, ALKPHOS, BILITOT, PROT, ALBUMIN in the last 168 hours. No results for input(s): LIPASE, AMYLASE in the last 168 hours. No results for input(s): AMMONIA in the last 168 hours. Coagulation Profile: No results for input(s): INR, PROTIME in the last 168 hours. Cardiac Enzymes: No results for input(s): CKTOTAL, CKMB, CKMBINDEX, TROPONINI in the last 168 hours. BNP (last 3 results) No results for input(s): PROBNP in the last 8760 hours. HbA1C: No results for input(s): HGBA1C in the last 72 hours. CBG: No results for input(s): GLUCAP in the last 168 hours. Lipid Profile: No results for input(s): CHOL, HDL, LDLCALC, TRIG, CHOLHDL, LDLDIRECT in the last 72 hours. Thyroid Function Tests: No results for input(s): TSH, T4TOTAL, FREET4, T3FREE, THYROIDAB in the last 72 hours. Anemia Panel: No results for input(s): VITAMINB12, FOLATE, FERRITIN, TIBC, IRON, RETICCTPCT in the last 72 hours. Urine analysis:    Component Value Date/Time   COLORURINE YELLOW 03/04/2016 1019   APPEARANCEUR CLEAR 03/04/2016 1019   LABSPEC 1.016 03/04/2016 1019   PHURINE 7.5 03/04/2016 1019   GLUCOSEU NEGATIVE 03/04/2016 1019   GLUCOSEU NEG mg/dL 02/16/2010 2226   HGBUR TRACE (A) 03/04/2016 1019   HGBUR negative 10/01/2008 0929   BILIRUBINUR NEGATIVE 03/04/2016 1019   KETONESUR NEGATIVE 03/04/2016 1019   PROTEINUR >300 (A) 03/04/2016 1019   UROBILINOGEN 0.2 02/16/2010 2226   NITRITE NEGATIVE 03/04/2016 1019   LEUKOCYTESUR NEGATIVE 03/04/2016 1019    Radiological Exams on Admission: DG Chest 2 View  Result Date: 06/14/2020 CLINICAL DATA:  Shortness of breath. EXAM: CHEST - 2 VIEW COMPARISON:   December 11, 2019 FINDINGS: Stable cardiomegaly. Stable generator device over the left lateral chest. The hila and mediastinum are unchanged. No pneumothorax. No nodules or masses. No focal infiltrates. No overt edema. IMPRESSION: No active cardiopulmonary disease. Electronically Signed   By: Dorise Bullion III M.D   On: 06/14/2020 18:53    EKG: Independently reviewed. Sinus rhythm with PACs, LBBB.  Not significantly changed when compared to previous.  Assessment/Plan Principal Problem:   Acute on chronic combined systolic and diastolic CHF (congestive  heart failure) ([place]) Active Problems:   Hyperlipidemia   Essential hypertension   Asthma   Obstructive sleep apnea   CKD (chronic kidney disease) stage 5, GFR less than 15 ml/min (HCC)   CAD (coronary artery disease), native coronary artery  Braelen Sproule is a 59 y.o. male with medical history significant for chronic combined systolic and diastolic CHF (EF 74-93% by echocardiogram 03/31/2020), CAD s/p PCI, ischemic cardiomyopathy, history of V. tach and cardiac arrest s/p ICD, CKD stage V, severe persistent asthma, hypertension, hyperlipidemia, and OSA who is admitted with acute on chronic combined systolic and diastolic CHF.  Acute on chronic combined systolic and diastolic CHF/ischemic cardiomyopathy: EF 30-35% by echocardiogram on 03/31/2020.  Patient has had worsening dyspnea on exertion, orthopnea, paroxysmal nocturnal dyspnea, and increase lower extremity edema. -Give IV Lasix 40 mg once now and monitor response -Continue home Coreg, Imdur -Strict I/O's and daily weights  CAD s/p PCI History of V. tach s/p ICD: Denies any chest pain.  Troponin mildly elevated likely demand ischemia from acute CHF. -Continue aspirin, atorvastatin, Coreg, Imdur  CKD stage V: Slightly worsened renal function compared to recent baseline.  Monitor closely with diuresis.  Severe persistent asthma: Likely some component of asthma exacerbation on admission,  now improved after initial albuterol treatment.  No active wheezing at time of  admission. -Continue Symbicort and as needed albuterol nebulizers  Hypertension: Continue amlodipine, Coreg, hydralazine, Imdur.  Hyperlipidemia: Continue atorvastatin.  OSA: Continue CPAP nightly.   DVT prophylaxis: Subcutaneous heparin Code Status: Full code, confirmed with patient Family Communication: Discussed with patient, he has discussed with family Disposition Plan: From home and likely discharge home pending improvement in respiratory status Consults called: None Admission status:  Status is: Observation  The patient remains OBS appropriate and will d/c before 2 midnights.  Dispo: The patient is from: Home              Anticipated d/c is to: Home              Anticipated d/c date is: 1 day pending improvement in respiratory symptoms.              Patient currently is not medically stable to d/c.  Victor Finders MD [facility]  If 7PM-7AM, please contact night-coverage [email or web address]  06/14/2020, 11:26 PM

## 2020-06-14 NOTE — ED Notes (Signed)
Patient is being discharged from the Urgent Care and sent to the Emergency Department via POV  . Per Augusto Gamble, NP, patient is in need of higher level of care due to SOB, BLE edema, HTN. Patient is aware and verbalizes understanding of plan of care.  Vitals:   06/14/20 1712  BP: (!) 184/108  Pulse: 88  Resp: (!) 24  Temp: 99.2 F (37.3 C)  SpO2: 96%

## 2020-06-14 NOTE — ED Triage Notes (Signed)
Pt c/o SOB, cough for approx 3 days. States SOBOE with mild exertion and when lying down. States "feels like my lungs are trying to fill up" and that his inhalers are not improving symptoms. Pt does not use/have a spacer for MDI inhalers.  Also reports runny nose. Denies fever, chills, n/v/d, sore throat, congestion   +3 edema to BLE that pt reports has been present for approx 1-2 weeks. Bilat lung sounds diminished bases, S1S2 irregular.  COVID test on 06/28 was negative.

## 2020-06-14 NOTE — ED Triage Notes (Signed)
The pt is c/o sob for 4 days no pain.  Swelling in his feet and ankles also

## 2020-06-14 NOTE — ED Provider Notes (Signed)
[place]    CSN: [id number] Arrival date & time: 06/14/20  [place]      History   Chief Complaint Chief Complaint  Patient presents with  . Shortness of Breath    HPI Victor Thompson is a 59 y.o. male.   Victor Thompson presents with complaints of shortness of breath  Which has worsened over the past 3-4 days, as well as non productive cough. Worse at night. Lower leg edema has been worsening over the past week. History of CHF, CKD on transplant list, htn, MI, and severe persistent asthma. Uses symbicort, also taking torsemide. Presents today with hypertension and mild tachypnea. Afebrile.      Past Medical History:  Diagnosis Date  . AICD (automatic cardioverter/defibrillator) present    2015  . Asthma    no meds  . Cardiac arrest ([place]) 2015  . CHF (congestive heart failure) ([place])   . Chronic kidney disease    ckd -stage 5  . Coronary artery disease   . Hypertension   . Myocardial infarction ([place])   . Obesity   . Pneumonia   . Pulmonary edema   . Shortness of breath dyspnea   . Sleep apnea    USES CPAP  . Wears glasses     Patient Active Problem List   Diagnosis Date Noted  . Secondary hyperparathyroidism of renal origin ([place]) 07/15/2019  . Vitamin D deficiency 07/15/2019  . CAD (coronary artery disease), native coronary artery 04/10/2019  . CKD (chronic kidney disease) stage 5, GFR less than 15 ml/min (HCC) 09/26/2018  . Chronic midline low back pain without sciatica 02/17/2018  . VT (ventricular tachycardia) ([place]) 03/29/2016  . Defibrillator discharge   . Chronic combined systolic and diastolic CHF (congestive heart failure) ([place]) 01/22/2016  . Erectile dysfunction 01/22/2016  . Allergic rhinitis 01/22/2016  . GERD (gastroesophageal reflux disease) 01/22/2016  . PROTEINURIA 02/17/2010  . Cardiomyopathy, ischemic 02/16/2010  . Obstructive sleep apnea 02/16/2010  . EXTERNAL HEMORRHOIDS 09/26/2009  . Tobacco use disorder in remission 06/03/2009  .  Essential hypertension 10/01/2008  . Hyperlipidemia 07/14/2007  . Class 3 severe obesity due to excess calories with serious comorbidity and body mass index (BMI) of 40.0 to 44.9 in adult ([place]) 07/14/2007  . Asthma 07/14/2007    Past Surgical History:  Procedure Laterality Date  . AV FISTULA PLACEMENT Right 03/15/2019   Procedure: RIGHT ARM ARTERIOVENOUS (AV) FISTULA CREATION;  Surgeon: Angelia Mould, MD;  Location: [facility];  Service: Vascular;  Laterality: Right;  . COLONOSCOPY    . COLONOSCOPY W/ BIOPSIES AND POLYPECTOMY    . CORONARY STENT PLACEMENT  2007  . IMPLANTABLE CARDIOVERTER DEFIBRILLATOR IMPLANT  2015       Home Medications    Prior to Admission medications   Medication Sig Start Date End Date Taking? Authorizing Provider  albuterol (PROVENTIL) (2.5 MG/3ML) 0.083% nebulizer solution Take 3 mLs (2.5 mg total) by nebulization every 6 (six) hours as needed for wheezing or shortness of breath. 10/01/19  Yes [name], MD  albuterol (VENTOLIN HFA) 108 (90 Base) MCG/ACT inhaler Inhale 2 puffs into the lungs every 4 (four) hours as needed for wheezing or shortness of breath. 10/01/19  Yes [name], MD  amLODipine (NORVASC) 10 MG tablet Take 1 tablet (10 mg total) by mouth daily. 03/24/20  Yes [name], MD  aspirin EC 81 MG tablet Take 1 tablet (81 mg total) by mouth daily. 01/22/16  Yes Boykin Nearing, MD  atorvastatin (LIPITOR)  40 MG tablet Take 1 tablet (40 mg total) by mouth at bedtime. 05/02/20  Yes [name], MD  budesonide-formoterol [facility]) 80-4.5 MCG/ACT inhaler Inhale 2 puffs into the lungs 2 (two) times daily. 06/04/20  Yes [name], MD  calcitRIOL (ROCALTROL) 0.5 MCG capsule Take 0.5 mcg by mouth daily.  08/16/18  Yes [provider]  carvedilol (COREG) 25 MG tablet Take 1 tablet (25 mg total) by mouth 2 (two) times daily with a meal. 10/01/19  Yes [name], MD  fluticasone (FLONASE) 50 MCG/ACT  nasal spray Place 1 spray into both nostrils daily. 12/31/18  Yes Providence Lanius A, PA-C  hydrALAZINE (APRESOLINE) 10 MG tablet Take 2 tablets (20 mg total) by mouth 2 (two) times daily. 10/01/19  Yes [name], MD  isosorbide mononitrate (IMDUR) 30 MG 24 hr tablet Take 1 tablet (30 mg total) by mouth daily. 10/01/19  Yes [name], MD  loratadine (CLARITIN) 10 MG tablet Take 10 mg by mouth daily as needed for allergies.   Yes [provider]  torsemide (DEMADEX) 20 MG tablet Take 2 tablets (40 mg total) by mouth daily. 10/01/19  Yes [name], MD  azithromycin (ZITHROMAX) 250 MG tablet Take 2 tablets p.o. on day 1 then 1 tablet daily 03/24/20   [name], MD  Cholecalciferol (VITAMIN D-3) 125 MCG (5000 UT) TABS Take 5,000 Units by mouth daily.     [provider]  cyclobenzaprine (FLEXERIL) 10 MG tablet TAKE 1 TABLET (10 MG TOTAL) BY MOUTH AT BEDTIME. AS NEEDED FOR PAIN 12/21/18   [name], MD  EPINEPHrine 0.3 mg/0.3 mL IJ SOAJ injection Inject 0.3 mLs (0.3 mg total) into the muscle once as needed for anaphylaxis. 03/24/20   [name], MD  predniSONE (DELTASONE) 10 MG tablet Take 4 tablets daily for 5 days then stop 04/07/20   [name], MD    Family History Family History  Problem Relation Age of Onset  . Hypertension Mother   . Liver disease Father     Social History Social History   Tobacco Use  . Smoking status: Never Smoker  . Smokeless tobacco: Never Used  Vaping Use  . Vaping Use: Never used  Substance Use Topics  . Alcohol use: No  . Drug use: No     Allergies   Ace inhibitors, Influenza vaccines, Ketorolac, Lidocaine, Lisinopril, and Penicillins   Review of Systems Review of Systems   Physical Exam Triage Vital Signs ED Triage Vitals  Enc Vitals Group     BP 06/14/20 1712 (!) 184/108     Pulse Rate 06/14/20 1712 88     Resp 06/14/20 1712 (!) 24     Temp 06/14/20 1712 99.2 F (37.3 C)      Temp Source 06/14/20 1712 Oral     SpO2 06/14/20 1712 96 %     Weight --      Height --      Head Circumference --      Peak Flow --      Pain Score 06/14/20 1713 0     Pain Loc --      Pain Edu? --      Excl. in [place]? --    No data found.  Updated Vital Signs BP (!) 184/108 (BP Location: Left Wrist)   Pulse 88   Temp 99.2 F (37.3 C) (Oral)   Resp (!) 24   SpO2 96%    Physical  Exam Cardiovascular:     Rate and Rhythm: Normal rate.  Pulmonary:     Effort: Tachypnea present.     Breath sounds: Decreased breath sounds present.  Musculoskeletal:     Right lower leg: 3+ Pitting Edema present.     Left lower leg: 3+ Edema present.      UC Treatments / Results  Labs (all labs ordered are listed, but only abnormal results are displayed) Labs Reviewed - No data to display  EKG   Radiology No results found.  Procedures Procedures (including critical care time)  Medications Ordered in UC Medications - No data to display  Initial Impression / Assessment and Plan / UC Course  I have reviewed the triage vital signs and the nursing notes.  Pertinent labs & imaging results that were available during my care of the patient were reviewed by me and considered in my medical decision making (see chart for details).     Hypertensive, tachypneic, increased edema in this patient awaiting kidney transplant with CHF and severe asthma. Likely needs diuresis, feel it is safest for patient to present to the ER at this time for more thorough evaluation and treatment. Patient agreeable and is self transporting now.  Final Clinical Impressions(s) / UC Diagnoses   Final diagnoses:  Shortness of breath  Peripheral edema     Discharge Instructions     I do think it is appropriate for you to get more complete evaluation in the ER.    ED Prescriptions    None     PDMP not reviewed this encounter.   Zigmund Gottron, NP 06/14/20 1732

## 2020-06-14 NOTE — ED Triage Notes (Signed)
The pt has a tro of 111

## 2020-06-15 ENCOUNTER — Encounter (HOSPITAL_COMMUNITY): Payer: Self-pay | Admitting: Internal Medicine

## 2020-06-15 ENCOUNTER — Observation Stay (HOSPITAL_BASED_OUTPATIENT_CLINIC_OR_DEPARTMENT_OTHER): Payer: BC Managed Care – PPO

## 2020-06-15 DIAGNOSIS — N185 Chronic kidney disease, stage 5: Secondary | ICD-10-CM | POA: Diagnosis not present

## 2020-06-15 DIAGNOSIS — I5043 Acute on chronic combined systolic (congestive) and diastolic (congestive) heart failure: Secondary | ICD-10-CM

## 2020-06-15 DIAGNOSIS — I2510 Atherosclerotic heart disease of native coronary artery without angina pectoris: Secondary | ICD-10-CM | POA: Diagnosis not present

## 2020-06-15 DIAGNOSIS — J4540 Moderate persistent asthma, uncomplicated: Secondary | ICD-10-CM

## 2020-06-15 DIAGNOSIS — I160 Hypertensive urgency: Secondary | ICD-10-CM

## 2020-06-15 DIAGNOSIS — Z9119 Patient's noncompliance with other medical treatment and regimen: Secondary | ICD-10-CM

## 2020-06-15 DIAGNOSIS — E7800 Pure hypercholesterolemia, unspecified: Secondary | ICD-10-CM

## 2020-06-15 LAB — CBC
HCT: 35.4 % — ABNORMAL LOW (ref 39.0–52.0)
Hemoglobin: 11.1 g/dL — ABNORMAL LOW (ref 13.0–17.0)
MCH: 28.5 pg (ref 26.0–34.0)
MCHC: 31.4 g/dL (ref 30.0–36.0)
MCV: 91 fL (ref 80.0–100.0)
Platelets: 200 10*3/uL (ref 150–400)
RBC: 3.89 MIL/uL — ABNORMAL LOW (ref 4.22–5.81)
RDW: 17.2 % — ABNORMAL HIGH (ref 11.5–15.5)
WBC: 6.7 10*3/uL (ref 4.0–10.5)
nRBC: 0 % (ref 0.0–0.2)

## 2020-06-15 LAB — ECHOCARDIOGRAM COMPLETE
Height: 67 in
Weight: 4222.4 oz

## 2020-06-15 LAB — BASIC METABOLIC PANEL
Anion gap: 12 (ref 5–15)
BUN: 67 mg/dL — ABNORMAL HIGH (ref 6–20)
CO2: 20 mmol/L — ABNORMAL LOW (ref 22–32)
Calcium: 8.4 mg/dL — ABNORMAL LOW (ref 8.9–10.3)
Chloride: 111 mmol/L (ref 98–111)
Creatinine, Ser: 8.04 mg/dL — ABNORMAL HIGH (ref 0.61–1.24)
GFR calc Af Amer: 8 mL/min — ABNORMAL LOW (ref >60)
GFR calc non Af Amer: 7 mL/min — ABNORMAL LOW (ref >60)
Glucose, Bld: 105 mg/dL — ABNORMAL HIGH (ref 70–99)
Potassium: 3.4 mmol/L — ABNORMAL LOW (ref 3.5–5.1)
Sodium: 143 mmol/L (ref 135–145)

## 2020-06-15 LAB — HIV ANTIBODY (ROUTINE TESTING W REFLEX): HIV Screen 4th Generation wRfx: NONREACTIVE

## 2020-06-15 LAB — MAGNESIUM
Magnesium: 2.2 mg/dL (ref 1.7–2.4)
Magnesium: 2.1 mg/dL (ref 1.7–2.4)

## 2020-06-15 LAB — BRAIN NATRIURETIC PEPTIDE: B Natriuretic Peptide: 931.6 pg/mL — ABNORMAL HIGH (ref 0.0–100.0)

## 2020-06-15 MED ORDER — POTASSIUM CHLORIDE CRYS ER 20 MEQ PO TBCR
40.0000 meq | EXTENDED_RELEASE_TABLET | Freq: Once | ORAL | Status: AC
  Administered 2020-06-15: 40 meq via ORAL
  Filled 2020-06-15: qty 2

## 2020-06-15 MED ORDER — TORSEMIDE 20 MG PO TABS
40.0000 mg | ORAL_TABLET | Freq: Every day | ORAL | Status: DC
  Administered 2020-06-15: 40 mg via ORAL
  Filled 2020-06-15: qty 2

## 2020-06-15 MED ORDER — HYDRALAZINE HCL 25 MG PO TABS: 25.0000 mg | ORAL_TABLET | Freq: Three times a day (TID) | ORAL | 1 refills | Status: DC

## 2020-06-15 MED ORDER — HYDRALAZINE HCL 25 MG PO TABS: 25.0000 mg | ORAL_TABLET | Freq: Three times a day (TID) | ORAL | Status: DC

## 2020-06-15 MED ORDER — TORSEMIDE 20 MG PO TABS: 40.0000 mg | ORAL_TABLET | Freq: Two times a day (BID) | ORAL | 0 refills | Status: DC

## 2020-06-15 MED ORDER — PERFLUTREN LIPID MICROSPHERE
1.0000 mL | INTRAVENOUS | Status: DC | PRN
  Administered 2020-06-15: 2 mL via INTRAVENOUS
  Filled 2020-06-15: qty 10

## 2020-06-15 NOTE — Discharge Summary (Signed)
Physician Discharge Summary [facility]    Patient: Victor Thompson                   Admit date: 06/14/2020   DOB: [birth year]             Discharge date:06/15/2020/12:45 PM ZPH:[id number]                          PCP: [name], MD  Disposition: Home   Recommendations for Outpatient Follow-up:   . Follow up: in 1 week  . Follow-up with your nephrologist within 1 week  Discharge Condition: Stable   Code Status:   Code Status: Full Code  Diet recommendation: Renal diet   Discharge Diagnoses:    Principal Problem:   Acute on chronic combined systolic and diastolic CHF (congestive heart failure) ([place]) Active Problems:   Hyperlipidemia   Essential hypertension   Asthma   Obstructive sleep apnea   CKD (chronic kidney disease) stage 5, GFR less than 15 ml/min (HCC)   CAD (coronary artery disease), native coronary artery   History of Present Illness/ Hospital Course Victor Thompson Summary:   Victor Thompson is a 58 y.o. male with medical history significant for chronic combined systolic and diastolic CHF (EF 80-16% by echocardiogram 03/31/2020), CAD s/p PCI, ischemic cardiomyopathy, history of V. tach and cardiac arrest s/p ICD, CKD stage V, severe persistent asthma, hypertension, hyperlipidemia, and OSA who is admitted with acute on chronic combined systolic and diastolic CHF.  Acute on chronic combined systolic and diastolic CHF/ischemic cardiomyopathy: EF 30-35% by echocardiogram on 03/31/2020.  Patient has had worsening dyspnea on exertion, orthopnea, paroxysmal nocturnal dyspnea, and increase lower extremity edema. -Status post treatment with IV Lasix 40 mg >.>>>> switch to p.o. torsemide 40 mg p.o. twice daily x5 days then 40 mg daily till follow-up with nephrology  -amlodipine 10 mg daily, carvedilol 25 mg p.o. twice daily, increase hydralazine to 25 mg p.o. 3 times daily, continue Imdur 30 mg daily   CAD s/p PCI History of V. tach s/p ICD: Denies any chest  pain.  Troponin mildly elevated likely demand ischemia from acute CHF. -Continue aspirin, atorvastatin, Coreg, Imdur  CKD stage V: Slightly worsened renal function compared to recent baseline.  Monitor closely with diuresis. Called nephrologist on-call Dr. Tessa Lerner who reviewed the records, recommended no immediate or urgent dialysis needed.  Recommended diuretics may be doubled for next few days till patient sees his primary nephrologist for near future hemodialysis.  Has appointment with Dr. Nyra Capes this months, he is nearing hemodialysis.   Severe persistent asthma: Likely some component of asthma exacerbation on admission, now improved after initial albuterol treatment.  No active wheezing at time of  admission. -Continue Symbicort and as needed albuterol nebulizers  Hypertension: Continue amlodipine, Coreg, hydralazine, Imdur.  Hyperlipidemia: Continue atorvastatin.  OSA: Continue CPAP nightly.    Code Status: Full code, confirmed with patient Family Communication: Discussed with patient, he has discussed with family Disposition Plan: From home and discharge home  Consults called:  Cardiology, curbside consult nephrology Admission status: Status is: Observation    Dispo: The patient is from: Home  Anticipated d/c is to: Home   Nutritional status:          Discharge Instructions:   Discharge Instructions    (HEART FAILURE PATIENTS) Call MD:  Anytime you have any of the following symptoms: 1) 3 pound weight gain in 24 hours or 5 pounds in 1 week  2) shortness of breath, with or without a dry hacking cough 3) swelling in the hands, feet or stomach 4) if you have to sleep on extra pillows at night in order to breathe.   Complete by: As directed    Activity as tolerated - No restrictions   Complete by: As directed    Call MD for:  difficulty breathing, headache or visual disturbances   Complete by: As directed    Diet - low sodium heart healthy    Complete by: As directed    Discharge instructions   Complete by: As directed    Please take the current medication as recommended by cardiologist. Current recommendation to take your torsemide twice a day for next few days till you see your nephrologist. Please follow-up with your nephrologist as soon as possible. Likely need hemodialysis in near future.   Increase activity slowly   Complete by: As directed        Medication List    STOP taking these medications   azithromycin 250 MG tablet Commonly known as: ZITHROMAX   cyclobenzaprine 10 MG tablet Commonly known as: FLEXERIL   predniSONE 10 MG tablet Commonly known as: DELTASONE     TAKE these medications   albuterol 108 (90 Base) MCG/ACT inhaler Commonly known as: VENTOLIN HFA Inhale 2 puffs into the lungs every 4 (four) hours as needed for wheezing or shortness of breath.   albuterol (2.5 MG/3ML) 0.083% nebulizer solution Commonly known as: PROVENTIL Take 3 mLs (2.5 mg total) by nebulization every 6 (six) hours as needed for wheezing or shortness of breath.   amLODipine 10 MG tablet Commonly known as: NORVASC Take 1 tablet (10 mg total) by mouth daily.   aspirin EC 81 MG tablet Take 1 tablet (81 mg total) by mouth daily.   atorvastatin 40 MG tablet Commonly known as: LIPITOR Take 1 tablet (40 mg total) by mouth at bedtime.   budesonide-formoterol 80-4.5 MCG/ACT inhaler Commonly known as: SYMBICORT Inhale 2 puffs into the lungs 2 (two) times daily.   calcitRIOL 0.5 MCG capsule Commonly known as: ROCALTROL Take 0.5 mcg by mouth daily.   carvedilol 25 MG tablet Commonly known as: COREG Take 1 tablet (25 mg total) by mouth 2 (two) times daily with a meal.   EPINEPHrine 0.3 mg/0.3 mL Soaj injection Commonly known as: EPI-PEN Inject 0.3 mLs (0.3 mg total) into the muscle once as needed for anaphylaxis.   fluticasone 50 MCG/ACT nasal spray Commonly known as: FLONASE Place 1 spray into both nostrils  daily.   hydrALAZINE 25 MG tablet Commonly known as: APRESOLINE Take 1 tablet (25 mg total) by mouth 3 (three) times daily. What changed:   medication strength  how much to take  when to take this   isosorbide mononitrate 30 MG 24 hr tablet Commonly known as: IMDUR Take 1 tablet (30 mg total) by mouth daily.   loratadine 10 MG tablet Commonly known as: CLARITIN Take 10 mg by mouth daily as needed for allergies.   torsemide 20 MG tablet Commonly known as: DEMADEX Take 2 tablets (40 mg total) by mouth 2 (two) times daily for 5 days. May take the medication 40 mg p.o. twice daily x5 days then daily, per instruction by cardiology and nephrologist. What changed:   when to take this  additional instructions   Vitamin D-3 125 MCG (5000 UT) Tabs Take 5,000 Units by mouth daily.       Allergies  Allergen Reactions  . Ace Inhibitors Swelling  Swelling of the tongue  . Influenza Vaccines Hives and Swelling    SWELLING REACTION UNSPECIFIED   . Ketorolac Swelling    SWELLING REACTION UNSPECIFIED   . Lidocaine Swelling    TONGUE SWELLS  . Lisinopril Swelling    TONGUE SWELLING Pt reported problem with a BP med which sounded like  lisinopril  But as of 09/19/06,pt had tolerated altace without problem  . Penicillins Swelling    TONGUE SWELLS Has patient had a PCN reaction causing immediate rash, facial/tongue/throat swelling, SOB or lightheadedness with hypotension: Yes Has patient had a PCN reaction causing severe rash involving mucus membranes or skin necrosis: No Has patient had a PCN reaction that required hospitalization No Has patient had a PCN reaction occurring within the last 10 years: Yes If all of the above answers are "NO", then may proceed with Cephalosporin use.      Procedures /Studies:   DG Chest 2 View  Result Date: 06/14/2020 CLINICAL DATA:  Shortness of breath. EXAM: CHEST - 2 VIEW COMPARISON:  December 11, 2019 FINDINGS: Stable cardiomegaly. Stable  generator device over the left lateral chest. The hila and mediastinum are unchanged. No pneumothorax. No nodules or masses. No focal infiltrates. No overt edema. IMPRESSION: No active cardiopulmonary disease. Electronically Signed   By: Dorise Bullion III M.D   On: 06/14/2020 18:53     Subjective:   Patient was seen and examined 06/15/2020, 12:45 PM Patient stable today. No acute distress.  No issues overnight Stable for discharge.  Discharge Exam:    Vitals:   06/15/20 0518 06/15/20 0841 06/15/20 0900 06/15/20 1044  BP: (!) 129/102  (!) 141/84 128/80  Pulse: 91  77 76  Resp: 18   18  Temp: 98.1 F (36.7 C)   97.7 F (36.5 C)  TempSrc: Oral   Oral  SpO2: 100% 100%  100%  Weight:      Height:        General: Pt lying comfortably in bed & appears in no obvious distress. Cardiovascular: S1 & S2 heard, RRR, S1/S2 +. No murmurs, rubs, gallops or clicks. No JVD or pedal edema. Respiratory: Clear to auscultation without wheezing, rhonchi or crackles. No increased work of breathing. Abdominal:  Non-distended, non-tender & soft. No organomegaly or masses appreciated. Normal bowel sounds heard. CNS: Alert and oriented. No focal deficits. Extremities: no edema, no cyanosis    The results of significant diagnostics from this hospitalization (including imaging, microbiology, ancillary and laboratory) are listed below for reference.      Microbiology:   Recent Results (from the past 240 hour(s))  SARS Coronavirus 2 by RT PCR (hospital order, performed in [facility] hospital lab) Nasopharyngeal Nasopharyngeal Swab     Status: None   Collection Time: 06/14/20  8:52 PM   Specimen: Nasopharyngeal Swab  Result Value Ref Range Status   SARS Coronavirus 2 NEGATIVE NEGATIVE Final    Comment: (NOTE) SARS-CoV-2 target nucleic acids are NOT DETECTED.  The SARS-CoV-2 RNA is generally detectable in upper and lower respiratory specimens during the acute phase of infection. The  lowest concentration of SARS-CoV-2 viral copies this assay can detect is 250 copies / mL. A negative result does not preclude SARS-CoV-2 infection and should not be used as the sole basis for treatment or other patient management decisions.  A negative result may occur with improper specimen collection / handling, submission of specimen other than nasopharyngeal swab, presence of viral mutation(s) within the areas targeted by this assay, and inadequate number  of viral copies (<250 copies / mL). A negative result must be combined with clinical observations, patient history, and epidemiological information.  Fact Sheet for Patients:   StrictlyIdeas.no  Fact Sheet for Healthcare Providers: BankingDealers.co.za  This test is not yet approved or  cleared by the [place] FDA and has been authorized for detection and/or diagnosis of SARS-CoV-2 by FDA under an Emergency Use Authorization (EUA).  This EUA will remain in effect (meaning this test can be used) for the duration of the COVID-19 declaration under Section 564(b)(1) of the Act, 21 U.S.C. section 360bbb-3(b)(1), unless the authorization is terminated or revoked sooner.  Performed at [facility], [place] [address], [place], [place] [address]      Labs:   CBC: Recent Labs  Lab 06/14/20 1818 06/15/20 0222  WBC 7.8 6.7  HGB 10.2* 11.1*  HCT 32.5* 35.4*  MCV 91.5 91.0  PLT 201 916   Basic Metabolic Panel: Recent Labs  Lab 06/14/20 1818 06/15/20 0222  NA 144 143  K 3.9 3.4*  CL 112* 111  CO2 18* 20*  GLUCOSE 94 105*  BUN 70* 67*  CREATININE 8.35* 8.04*  CALCIUM 8.3* 8.4*  MG  --  2.2   Liver Function Tests: No results for input(s): AST, ALT, ALKPHOS, BILITOT, PROT, ALBUMIN in the last 168 hours. BNP (last 3 results) Recent Labs    07/23/19 0820 11/26/19 1007 06/14/20 1818  BNP 697.5* 680.6* 931.6*  Urinalysis    Component Value Date/Time    COLORURINE YELLOW 03/04/2016 1019   APPEARANCEUR CLEAR 03/04/2016 1019   LABSPEC 1.016 03/04/2016 1019   PHURINE 7.5 03/04/2016 1019   GLUCOSEU NEGATIVE 03/04/2016 1019   GLUCOSEU NEG mg/dL 02/16/2010 2226   HGBUR TRACE (A) 03/04/2016 1019   HGBUR negative 10/01/2008 0929   BILIRUBINUR NEGATIVE 03/04/2016 1019   KETONESUR NEGATIVE 03/04/2016 1019   PROTEINUR >300 (A) 03/04/2016 1019   UROBILINOGEN 0.2 02/16/2010 2226   NITRITE NEGATIVE 03/04/2016 1019   LEUKOCYTESUR NEGATIVE 03/04/2016 1019         Time coordinating discharge: Over 45 minutes  SIGNED: [name], MD, FACP, FHM. [facility],  Please use [email or web address] to Page If 7PM-7AM, please contact night-coverage [email or web address] [email or web address] 06/15/2020, 12:45 PM

## 2020-06-15 NOTE — Progress Notes (Signed)
Pt placed on cpap 

## 2020-06-15 NOTE — Consult Note (Addendum)
Cardiology Consultation:   Patient ID: Victor Thompson MRN: [id number]; DOB: 03/15/1961  Admit date: 06/14/2020 Date of Consult: 06/15/2020  Primary Care Provider: [name], MD [facility] Cardiologist: Dr Radford Pax Electrophysiologist: Dr. Rayann Heman  Patient Profile:   Victor Thompson is a 59 y.o. male with a hx of CAD (PCI 2007), ICM w/hx of arrest and ICD (at [facility] in 2015), HTN, HLD, CKD, approaching dialysis), had AVF creation April 2020, chronic CHF (systolic), OSA w/CPAP, severe persistent asthma, morbid obesity, who is being seen today for the evaluation of acute on chronic combined CHF at the request of Dr. Zada Finders.  History of Present Illness:   Victor Thompson follows with Dr. Radford Pax and Dr. Rayann Heman, he was last seen in December 2020 when he was stable. He has hx of ischemic cardiomyopathy with cardiac arrest status post ICD at [facility] in 2015, CAD s/p PCI in 3846, chronic systolic and diastolic CHF, morbid obesity, OSA on CPAP, chronic kidney disease, hypertension and hyperlipidemia seen for SOB.   Echo in 2016 showed mildly reduced LV systolic function with EF 45 to 50% with moderate hypokinesis of the anterior myocardium and mild hypokinesis of the inferior myocardium.  Established care with Dr. Radford Pax 04/2019. Echo 06/2019 showed 25-30% LVEF with grade 2 DD and mild MR. No ACE/ARB given CKD. He was placed on hydralazine and Imdur. Repeat echo 09/2019 showed improved LVEF to 40-45% and grade 2 DD. Recommended continued medical therapy.   He has CKD stage V, not on dialysis yet, followed as outpatient with Dr. Baird Cancer from nephrology.  Also history of severe persistent asthma, hypertension, hyperlipidemia, and OSA on CPAP at home   He presented to the ED with dyspnea on June 14, 2020, he admits that he has been drinking extra fluids, not being compliant with low-sodium diet and missed some doses of torsemide.  He dyspnea started about a week ago, it  progressed to orthopnea proximal nocturnal dyspnea as well as mild lower extremity edema.  He denies any cough, fever or chills, no chest pain, no recent palpitations or ICD firing.  No syncope.  He was found to be hypertensive in the ER BP 184/108, pulse 88, SPO2 96% on room air.  Labs notable for sodium 144, potassium 3.9, bicarb 18, BUN 70, creatinine 8.35, EGFR 7, serum glucose 94, WBC 7.8, hemoglobin 10.2, platelets 201,000, high-sensitivity troponin I 111 >> 114.  BNP was elevated at 931.  Chest x-ray did not show any pulmonary edema.  Past Medical History:  Diagnosis Date  . AICD (automatic cardioverter/defibrillator) present    2015  . Asthma    no meds  . Cardiac arrest ([place]) 2015  . CHF (congestive heart failure) ([place])   . Chronic kidney disease    ckd -stage 5  . Coronary artery disease   . Hypertension   . Myocardial infarction ([place])   . Obesity   . Pneumonia   . Pulmonary edema   . Shortness of breath dyspnea   . Sleep apnea    USES CPAP  . Wears glasses     Past Surgical History:  Procedure Laterality Date  . AV FISTULA PLACEMENT Right 03/15/2019   Procedure: RIGHT ARM ARTERIOVENOUS (AV) FISTULA CREATION;  Surgeon: Angelia Mould, MD;  Location: [place];  Service: Vascular;  Laterality: Right;  . COLONOSCOPY    . COLONOSCOPY W/ BIOPSIES AND POLYPECTOMY    . CORONARY STENT PLACEMENT  2007  . IMPLANTABLE CARDIOVERTER DEFIBRILLATOR IMPLANT  2015  Inpatient Medications: Scheduled Meds: . amLODipine  10 mg Oral Daily  . aspirin EC  81 mg Oral Daily  . atorvastatin  40 mg Oral QHS  . carvedilol  25 mg Oral BID WC  . heparin  5,000 Units Subcutaneous Q8H  . hydrALAZINE  20 mg Oral BID  . isosorbide mononitrate  30 mg Oral Daily  . mometasone-formoterol  2 puff Inhalation BID  . sodium chloride flush  3 mL Intravenous Q12H   Continuous Infusions: . sodium chloride     PRN Meds: sodium chloride, acetaminophen, albuterol, ondansetron (ZOFRAN) IV,  sodium chloride flush  Allergies:    Allergies  Allergen Reactions  . Ace Inhibitors Swelling    Swelling of the tongue  . Influenza Vaccines Hives and Swelling    SWELLING REACTION UNSPECIFIED   . Ketorolac Swelling    SWELLING REACTION UNSPECIFIED   . Lidocaine Swelling    TONGUE SWELLS  . Lisinopril Swelling    TONGUE SWELLING Pt reported problem with a BP med which sounded like  lisinopril  But as of 09/19/06,pt had tolerated altace without problem  . Penicillins Swelling    TONGUE SWELLS Has patient had a PCN reaction causing immediate rash, facial/tongue/throat swelling, SOB or lightheadedness with hypotension: Yes Has patient had a PCN reaction causing severe rash involving mucus membranes or skin necrosis: No Has patient had a PCN reaction that required hospitalization No Has patient had a PCN reaction occurring within the last 10 years: Yes If all of the above answers are "NO", then may proceed with Cephalosporin use.     Social History:   Social History   Socioeconomic History  . Marital status: Single    Spouse name: Not on file  . Number of children: Not on file  . Years of education: Not on file  . Highest education level: Not on file  Occupational History  . Not on file  Tobacco Use  . Smoking status: Never Smoker  . Smokeless tobacco: Never Used  Vaping Use  . Vaping Use: Never used  Substance and Sexual Activity  . Alcohol use: No  . Drug use: No  . Sexual activity: Yes  Other Topics Concern  . Not on file  Social History Narrative   ** Merged History Encounter **       Social Determinants of Health   Financial Resource Strain:   . Difficulty of Paying Living Expenses:   Food Insecurity:   . Worried About Charity fundraiser in the Last Year:   . Arboriculturist in the Last Year:   Transportation Needs:   . Film/video editor (Medical):   Marland Kitchen Lack of Transportation (Non-Medical):   Physical Activity:   . Days of Exercise per Week:   .  Minutes of Exercise per Session:   Stress:   . Feeling of Stress :   Social Connections:   . Frequency of Communication with Friends and Family:   . Frequency of Social Gatherings with Friends and Family:   . Attends Religious Services:   . Active Member of Clubs or Organizations:   . Attends Archivist Meetings:   Marland Kitchen Marital Status:   Intimate Partner Violence:   . Fear of Current or Ex-Partner:   . Emotionally Abused:   Marland Kitchen Physically Abused:   . Sexually Abused:     Family History:    Family History  Problem Relation Age of Onset  . Hypertension Mother   . Liver disease Father  ROS:  Please see the history of present illness.  All other ROS reviewed and negative.     Physical Exam/Data:   Vitals:   06/15/20 0518 06/15/20 0841 06/15/20 0900 06/15/20 1044  BP: (!) 129/102  (!) 141/84 128/80  Pulse: 91  77 76  Resp: 18   18  Temp: 98.1 F (36.7 C)   97.7 F (36.5 C)  TempSrc: Oral   Oral  SpO2: 100% 100%  100%  Weight:      Height:        Intake/Output Summary (Last 24 hours) at 06/15/2020 1112 Last data filed at 06/15/2020 0904 Gross per 24 hour  Intake --  Output 1625 ml  Net -1625 ml   Last 3 Weights 06/15/2020 06/14/2020 04/07/2020  Weight (lbs) 263 lb 14.4 oz 270 lb 258 lb  Weight (kg) 119.704 kg 122.471 kg 117.028 kg  Some encounter information is confidential and restricted. Go to Review Flowsheets activity to see all data.     Body mass index is 41.33 kg/m.  General: Obese, well developed, in no acute distress HEENT: normal Lymph: no adenopathy Neck: no JVD Endocrine:  No thryomegaly Vascular: No carotid bruits; FA pulses 2+ bilaterally without bruits  Cardiac:  normal S1, S2; RRR; 2/6 systolic Lungs:  clear to auscultation bilaterally, no wheezing, rhonchi or rales  Abd: soft, nontender, no hepatomegaly  Ext: no edema Musculoskeletal:  No deformities, BUE and BLE strength normal and equal Skin: warm and dry  Neuro:  CNs 2-12 intact,  no focal abnormalities noted Psych:  Normal affect   EKG:  The EKG was personally reviewed and demonstrates: Sinus rhythm, left axis deviation, left bundle branch block and PVCs, this is unchanged from prior EKGs in January and December 2020.  This was personally reviewed. Telemetry:  Telemetry was personally reviewed and demonstrates: Sinus rhythm with occasional PACs and PVCs  Relevant CV Studies:  TTE: 09/19/2019  1. Left ventricular ejection fraction, by visual estimation, is 40 to  45%. The left ventricle has moderately decreased function. Moderately  increased left ventricular size. There is no left ventricular hypertrophy.  2. Inferior akinesis.  3. Elevated left atrial and left ventricular end-diastolic pressures.  4. Left ventricular diastolic Doppler parameters are consistent with  pseudonormalization pattern of LV diastolic filling.  5. Global right ventricle has normal systolic function.The right  ventricular size is mildly enlarged. No increase in right ventricular wall  thickness.  6. Left atrial size was normal.  7. Right atrial size was normal.  8. The mitral valve is abnormal. Mild mitral valve regurgitation.  9. The tricuspid valve is grossly normal. Tricuspid valve regurgitation  is trivial.  10. The aortic valve is tricuspid Aortic valve regurgitation was not  visualized by color flow Doppler. Mild aortic valve sclerosis without  stenosis.  11. The pulmonic valve was grossly normal. Pulmonic valve regurgitation is  trivial by color flow Doppler.  12. Normal pulmonary artery systolic pressure.    Laboratory Data:  High Sensitivity Troponin:   Recent Labs  Lab 06/14/20 1818 06/14/20 2012  TROPONINIHS 111* 114*     Chemistry Recent Labs  Lab 06/14/20 1818 06/15/20 0222  NA 144 143  K 3.9 3.4*  CL 112* 111  CO2 18* 20*  GLUCOSE 94 105*  BUN 70* 67*  CREATININE 8.35* 8.04*  CALCIUM 8.3* 8.4*  GFRNONAA 6* 7*  GFRAA 7* 8*  ANIONGAP 14 12      No results for input(s): PROT, ALBUMIN, AST, ALT, ALKPHOS,  BILITOT in the last 168 hours. Hematology Recent Labs  Lab 06/14/20 1818 06/15/20 0222  WBC 7.8 6.7  RBC 3.55* 3.89*  HGB 10.2* 11.1*  HCT 32.5* 35.4*  MCV 91.5 91.0  MCH 28.7 28.5  MCHC 31.4 31.4  RDW 17.2* 17.2*  PLT 201 200   BNP Recent Labs  Lab 06/14/20 1818  BNP 931.6*    DDimer No results for input(s): DDIMER in the last 168 hours.   Radiology/Studies:  DG Chest 2 View  Result Date: 06/14/2020 CLINICAL DATA:  Shortness of breath. EXAM: CHEST - 2 VIEW COMPARISON:  December 11, 2019 FINDINGS: Stable cardiomegaly. Stable generator device over the left lateral chest. The hila and mediastinum are unchanged. No pneumothorax. No nodules or masses. No focal infiltrates. No overt edema. IMPRESSION: No active cardiopulmonary disease. Electronically Signed   By: Dorise Bullion III M.D   On: 06/14/2020 18:53    Assessment and Plan:   1. Acute on chronic combined systolic diastolic CHF 2. Ischemic cardiomyopathy, LVEF 40 to 45% in October 2020, CAD, remote PCI, no recent chest pain 3. H/o cardiac arrest, status post ICD placement, last ICD shock in May 2020 4. Noncompliance 5. Chronic persistent asthma 6. Obstructive sleep apnea on CPAP 7. CKD stage V nearing hemodialysis 8. Hypertension 9. Hyperlipidemia 10.   -The patient presented after fluid overload secondary to dietary and medication noncompliance, he has responded well to IV Lasix, he does not seem to be fluid overloaded on physical exam, I would restart his home torsemide 40 mg daily, he is currently 263 pounds, his weight in December 2020 was 278 pounds.  -Repeat echocardiogram, if LVEF less than 35% with persistent left bundle branch block he might be a candidate for upgrade from ICD to biventricular ICD  -Mainstay of therapy will be compliance with diet, limiting fluids and compliant with his medications, counseled on minimizing sodium, fluid  restriction and compliance with his torsemide therapy, replace potassium  -Has appointment with Dr. Nyra Capes this months, he is nearing hemodialysis  -Continue amlodipine 10 mg daily, carvedilol 25 mg p.o. twice daily, increase hydralazine to 25 mg p.o. 3 times daily, continue Imdur 30 mg daily  -Continue ASA, BB, nitrate, statin  -Continue CPAP  -Continue Symbicort and as needed albuterol nebulizers  The patient can potentially be discharged today, obtain an echocardiogram for evaluation of LVEF, he needs to follow-up in our clinic within 2 weeks from discharge.  For questions or updates, please contact [place] Please consult [email or web address] for contact info under   Signed, Ena Dawley, MD  06/15/2020 11:12 AM

## 2020-06-15 NOTE — Progress Notes (Addendum)
  Echocardiogram 2D Echocardiogram with contrast has been performed.  Merrie Roof F 06/15/2020, 1:44 PM

## 2020-06-15 NOTE — Progress Notes (Signed)
Patient had 5 beat run of V-tach per CCMD. MD Posey Pronto notified. Patient asymptomatic and is currently NSR in the 80's.

## 2020-06-15 NOTE — Progress Notes (Signed)
Nsg Discharge Note  Admit Date:  06/14/2020 Discharge date: 06/15/2020   [name] to be D/C'd Home per MD order.  AVS completed.  Copy for chart, and copy for patient signed, and dated. Patient/caregiver able to verbalize understanding.  Discharge Medication:   Discharge Assessment: Vitals:   06/15/20 0900 06/15/20 1044  BP: (!) 141/84 128/80  Pulse: 77 76  Resp:  18  Temp:  97.7 F (36.5 C)  SpO2:  100%   Skin clean, dry and intact without evidence of skin break down, no evidence of skin tears noted. IV catheter discontinued intact. Site without signs and symptoms of complications - no redness or edema noted at insertion site, patient denies c/o pain - only slight tenderness at site.  Dressing with slight pressure applied.  D/c Instructions-Education: Discharge instructions given to patient/family with verbalized understanding. D/c education completed with patient/family including follow up instructions, medication list, d/c activities limitations if indicated, with other d/c instructions as indicated by MD - patient able to verbalize understanding, all questions fully answered. Patient instructed to return to ED, call 911, or call MD for any changes in condition.  Patient escorted via [place], and D/C home via private auto.  Erasmo Leventhal, RN 06/15/2020 4:33 PM

## 2020-06-16 ENCOUNTER — Telehealth: Payer: Self-pay

## 2020-06-16 NOTE — Telephone Encounter (Signed)
Transition Care Management Follow-up Telephone Call Date of discharge and from where: 06/15/2020, [facility]   Call placed to patient # [phone], message left with call back requested to this CM # [phone].  Patient has an appointment with Dr [name] 06/30/2020

## 2020-06-17 ENCOUNTER — Telehealth: Payer: Self-pay

## 2020-06-17 NOTE — Telephone Encounter (Signed)
Call placed to patient and informed him that as per Dr [name] : Patient just released from the hospital 2 days ago with decompensated CHF. I recommend that he takes the torsemide twice a day for 5 days then once a day as recommended on his discharge papers. If needed, he can take 1 extra dose today.    He said that he understood and noted that he has been taking the torsemide twice daily as instructed on the AVS.

## 2020-06-17 NOTE — Telephone Encounter (Signed)
Transition Care Management Follow-up Telephone Call  Date of discharge and from where: 06/15/2020, [facility]  How have you been since you were released from the hospital? He said that he needs something to help get get the fluid out of his lungs, he feels like he is drowning. He explained that he hears a gurgling sound when he coughs.  He stated that he is usually given prednisone for this issue but not this time.  He is not able to expectorate any phlegm. He last used his nebulizer yesterday. Informed him that Dr [name] would be notified of his concern  Any questions or concerns?  noted above  Items Reviewed:  Did the pt receive and understand the discharge instructions provided?  yes  Medications obtained and verified?  he said that he has all medications nad understands the changes with the meds and did not have any questions.   Any new allergies since your discharge?  none reported   Do you have support at home?  lives alone  No home health or DME ordered  Has CPAP, nebulizer.  Does not have a scale.   Functional Questionnaire: (I = Independent and D = Dependent) ADLs:independent  Follow up appointments reviewed:   PCP Hospital f/u appt confirmed?Dr [name] 06/30/2020, he did not want to schedule an appointment any sooner  [facility] f/u appt confirmed? he needs to schedule appt with nephrology.   Are transportation arrangements needed?  no  If their condition worsens, is the pt aware to call PCP or go to the Emergency Dept.? } yes  Was the patient provided with contact information for the PCP's office or ED?  he has the phone number for the clinic  Was to pt encouraged to call back with questions or concerns?  yes

## 2020-06-19 ENCOUNTER — Ambulatory Visit (INDEPENDENT_AMBULATORY_CARE_PROVIDER_SITE_OTHER): Payer: BC Managed Care – PPO | Admitting: Emergency Medicine

## 2020-06-19 ENCOUNTER — Other Ambulatory Visit: Payer: Self-pay

## 2020-06-19 DIAGNOSIS — I255 Ischemic cardiomyopathy: Secondary | ICD-10-CM | POA: Diagnosis not present

## 2020-06-19 LAB — CUP PACEART INCLINIC DEVICE CHECK
Date Time Interrogation Session: 20210715083253
Implantable Lead Implant Date: 20150507
Implantable Lead Location: 753862
Implantable Lead Model: 3400
Implantable Pulse Generator Implant Date: 20150507
Pulse Gen Model: 1010
Pulse Gen Serial Number: 14970

## 2020-06-19 NOTE — Progress Notes (Signed)
Subcutaneous ICD check in clinic. 0 untreated episodes; 0 treated episodes; 0 shocks delivered. Electrode impedance status okay. No programming changes. Remaining longevity to ERI 2%. See scanned report. ROV for battery recheck 07/17/20.

## 2020-06-20 ENCOUNTER — Other Ambulatory Visit: Payer: Self-pay | Admitting: Nurse Practitioner

## 2020-06-24 MED FILL — ATORVASTATIN CALCIUM 40 MG: 40 | 30 days supply | Qty: 30 | Fill #1

## 2020-06-25 MED FILL — CARVEDILOL 25 MG TABLET: 25 | 90 days supply | Qty: 180 | Fill #2

## 2020-06-30 ENCOUNTER — Telehealth: Payer: Self-pay

## 2020-06-30 ENCOUNTER — Other Ambulatory Visit: Payer: Self-pay

## 2020-06-30 ENCOUNTER — Encounter: Payer: Self-pay | Admitting: Internal Medicine

## 2020-06-30 ENCOUNTER — Ambulatory Visit: Payer: BC Managed Care – PPO | Attending: Internal Medicine | Admitting: Internal Medicine

## 2020-06-30 VITALS — BP 110/55 | HR 63 | Resp 16 | Wt 268.6 lb

## 2020-06-30 DIAGNOSIS — N2581 Secondary hyperparathyroidism of renal origin: Secondary | ICD-10-CM

## 2020-06-30 DIAGNOSIS — J449 Chronic obstructive pulmonary disease, unspecified: Secondary | ICD-10-CM | POA: Diagnosis not present

## 2020-06-30 DIAGNOSIS — N185 Chronic kidney disease, stage 5: Secondary | ICD-10-CM | POA: Diagnosis not present

## 2020-06-30 DIAGNOSIS — I5042 Chronic combined systolic (congestive) and diastolic (congestive) heart failure: Secondary | ICD-10-CM | POA: Diagnosis not present

## 2020-06-30 DIAGNOSIS — Z09 Encounter for follow-up examination after completed treatment for conditions other than malignant neoplasm: Secondary | ICD-10-CM

## 2020-06-30 MED FILL — AMLODIPINE BESYLATE 10 MG T: 10 | 30 days supply | Qty: 30 | Fill #2

## 2020-06-30 NOTE — Telephone Encounter (Signed)
Met with the patient when he was in the clinic today. He explained that he is seriously considering ending his employment due to his medical issues. He said that he applied for disability about 5 years ago and was denied.  He was in agreement to having this CM place a referral to [employer] [place] [facility] [place] and/or the [facility] for assistance with re-applying for disability.

## 2020-06-30 NOTE — Progress Notes (Signed)
Patient ID: Victor Thompson, male    DOB: 1961-06-09  MRN: [id number]  CC: Transition of care Date of hospitalization: 7/10-10/2020 Date of phone call from care manager: 06/17/2020  Subjective: Victor Thompson is a 59 y.o. male who presents for insertion of care visit. His concerns today include:  Pt with hx of CKD stage5(Dr. Joelyn Oms), HTN, CAD (with hx of cardiac arrest and), ICD, combined sys/dia CHF(EF 30-35 % 03/2020), HL, Obese, COPD,chronic lower back pain with multilevel spondylosis.  Pt presents for hosp f/u.  He was hosp with acute on chronic combined systolic and diastolic.  He was diuresed.  Hydralazine increased to 25 mg TID  Today:   CHF:  No further LE edema.  No CP.  However, he can not walk more than 5 min without feeling tired He limits salt in foods.   Has scale at home but not using.  Gained 5 lbs since hosp.  Reports compliance with meds Has app 07/17/20 with cardiology.  CKD V:  Turned down for transplant.  Reports he was told his heart is too weak. Has appt with nephrologist next mth.   COPD:  Using Symbicort QOD.  Does not feel he needs it every day.  Last used neb 3 days ago.   C/o chronic dry cough "as if I'm trying to cough up fluid."   PFT done 05/2020 revealed moderate obstructive ds    Patient Active Problem List   Diagnosis Date Noted  . Acute on chronic combined systolic and diastolic CHF (congestive heart failure) ([place]) 06/14/2020  . Secondary hyperparathyroidism of renal origin ([place]) 07/15/2019  . Vitamin D deficiency 07/15/2019  . CAD (coronary artery disease), native coronary artery 04/10/2019  . CKD (chronic kidney disease) stage 5, GFR less than 15 ml/min (HCC) 09/26/2018  . Chronic midline low back pain without sciatica 02/17/2018  . VT (ventricular tachycardia) ([place]) 03/29/2016  . Defibrillator discharge   . Chronic combined systolic and diastolic CHF (congestive heart failure) ([place]) 01/22/2016  . Erectile dysfunction 01/22/2016  .  Allergic rhinitis 01/22/2016  . GERD (gastroesophageal reflux disease) 01/22/2016  . PROTEINURIA 02/17/2010  . Cardiomyopathy, ischemic 02/16/2010  . Obstructive sleep apnea 02/16/2010  . EXTERNAL HEMORRHOIDS 09/26/2009  . Tobacco use disorder in remission 06/03/2009  . Essential hypertension 10/01/2008  . Hyperlipidemia 07/14/2007  . Class 3 severe obesity due to excess calories with serious comorbidity and body mass index (BMI) of 40.0 to 44.9 in adult ([place]) 07/14/2007  . Asthma 07/14/2007     Current Outpatient Medications on File Prior to Visit  Medication Sig Dispense Refill  . albuterol (PROVENTIL) (2.5 MG/3ML) 0.083% nebulizer solution Take 3 mLs (2.5 mg total) by nebulization every 6 (six) hours as needed for wheezing or shortness of breath. 75 mL 12  . albuterol (VENTOLIN HFA) 108 (90 Base) MCG/ACT inhaler Inhale 2 puffs into the lungs every 4 (four) hours as needed for wheezing or shortness of breath. 8 g 6  . amLODipine (NORVASC) 10 MG tablet Take 1 tablet (10 mg total) by mouth daily. 90 tablet 1  . aspirin EC 81 MG tablet Take 1 tablet (81 mg total) by mouth daily. 30 tablet 5  . atorvastatin (LIPITOR) 40 MG tablet Take 1 tablet (40 mg total) by mouth at bedtime. 30 tablet 1  . budesonide-formoterol (SYMBICORT) 80-4.5 MCG/ACT inhaler Inhale 2 puffs into the lungs 2 (two) times daily. 1 Inhaler 6  . calcitRIOL (ROCALTROL) 0.5 MCG capsule Take 0.5 mcg by mouth daily.   11  .  carvedilol (COREG) 25 MG tablet Take 1 tablet (25 mg total) by mouth 2 (two) times daily with a meal. 180 tablet 1  . Cholecalciferol (VITAMIN D-3) 125 MCG (5000 UT) TABS Take 5,000 Units by mouth daily.     Marland Kitchen EPINEPHrine 0.3 mg/0.3 mL IJ SOAJ injection Inject 0.3 mLs (0.3 mg total) into the muscle once as needed for anaphylaxis. 1 each 1  . fluticasone (FLONASE) 50 MCG/ACT nasal spray Place 1 spray into both nostrils daily. 16 g 2  . hydrALAZINE (APRESOLINE) 25 MG tablet Take 1 tablet (25 mg total) by mouth  3 (three) times daily. 90 tablet 1  . isosorbide mononitrate (IMDUR) 30 MG 24 hr tablet Take 1 tablet (30 mg total) by mouth daily. 90 tablet 1  . loratadine (CLARITIN) 10 MG tablet Take 10 mg by mouth daily as needed for allergies.    Marland Kitchen torsemide (DEMADEX) 20 MG tablet Take 2 tablets (40 mg total) by mouth 2 (two) times daily for 5 days. May take the medication 40 mg p.o. twice daily x5 days then daily, per instruction by cardiology and nephrologist. 20 tablet 0   No current facility-administered medications on file prior to visit.    Allergies  Allergen Reactions  . Ace Inhibitors Swelling    Swelling of the tongue  . Influenza Vaccines Hives and Swelling    SWELLING REACTION UNSPECIFIED   . Ketorolac Swelling    SWELLING REACTION UNSPECIFIED   . Lidocaine Swelling    TONGUE SWELLS  . Lisinopril Swelling    TONGUE SWELLING Pt reported problem with a BP med which sounded like  lisinopril  But as of 09/19/06,pt had tolerated altace without problem  . Penicillins Swelling    TONGUE SWELLS Has patient had a PCN reaction causing immediate rash, facial/tongue/throat swelling, SOB or lightheadedness with hypotension: Yes Has patient had a PCN reaction causing severe rash involving mucus membranes or skin necrosis: No Has patient had a PCN reaction that required hospitalization No Has patient had a PCN reaction occurring within the last 10 years: Yes If all of the above answers are "NO", then may proceed with Cephalosporin use.     Social History   Socioeconomic History  . Marital status: Single    Spouse name: Not on file  . Number of children: Not on file  . Years of education: Not on file  . Highest education level: Not on file  Occupational History  . Not on file  Tobacco Use  . Smoking status: Never Smoker  . Smokeless tobacco: Never Used  Vaping Use  . Vaping Use: Never used  Substance and Sexual Activity  . Alcohol use: No  . Drug use: No  . Sexual activity: Yes    Other Topics Concern  . Not on file  Social History Narrative   ** Merged History Encounter **       Social Determinants of Health   Financial Resource Strain:   . Difficulty of Paying Living Expenses:   Food Insecurity:   . Worried About Charity fundraiser in the Last Year:   . Arboriculturist in the Last Year:   Transportation Needs:   . Film/video editor (Medical):   Marland Kitchen Lack of Transportation (Non-Medical):   Physical Activity:   . Days of Exercise per Week:   . Minutes of Exercise per Session:   Stress:   . Feeling of Stress :   Social Connections:   . Frequency of Communication with Friends  and Family:   . Frequency of Social Gatherings with Friends and Family:   . Attends Religious Services:   . Active Member of Clubs or Organizations:   . Attends Archivist Meetings:   Marland Kitchen Marital Status:   Intimate Partner Violence:   . Fear of Current or Ex-Partner:   . Emotionally Abused:   Marland Kitchen Physically Abused:   . Sexually Abused:     Family History  Problem Relation Age of Onset  . Hypertension Mother   . Liver disease Father     Past Surgical History:  Procedure Laterality Date  . AV FISTULA PLACEMENT Right 03/15/2019   Procedure: RIGHT ARM ARTERIOVENOUS (AV) FISTULA CREATION;  Surgeon: Angelia Mould, MD;  Location: [place];  Service: Vascular;  Laterality: Right;  . COLONOSCOPY    . COLONOSCOPY W/ BIOPSIES AND POLYPECTOMY    . CORONARY STENT PLACEMENT  2007  . IMPLANTABLE CARDIOVERTER DEFIBRILLATOR IMPLANT  2015    ROS: Review of Systems Negative except as stated above  PHYSICAL EXAM: BP (!) 110/55   Pulse 63   Resp 16   Wt (!) 268 lb 9.6 oz (121.8 kg)   SpO2 94%   BMI 42.07 kg/m   Wt Readings from Last 3 Encounters:  06/30/20 (!) 268 lb 9.6 oz (121.8 kg)  06/15/20 263 lb 14.4 oz (119.7 kg)  04/07/20 258 lb (117 kg)   Physical Exam General appearance - alert, well appearing, and in no distress Mental status - normal mood,  behavior, speech, dress, motor activity, and thought processes Neck - supple, no significant adenopathy Chest -breath sounds decreased at the bases bilaterally.  No wheezes or rhonchi is heard.   Heart - normal rate, regular rhythm, normal S1, S2, no murmurs, rubs, clicks or gallops Extremities -trace lower extremity edema CMP Latest Ref Rng & Units 06/15/2020 06/14/2020 12/11/2019  Glucose 70 - 99 mg/dL 105(H) 94 125(H)  BUN 6 - 20 mg/dL 67(H) 70(H) 67(H)  Creatinine 0.61 - 1.24 mg/dL 8.04(H) 8.35(H) 7.13(H)  Sodium 135 - 145 mmol/L 143 144 142  Potassium 3.5 - 5.1 mmol/L 3.4(L) 3.9 3.8  Chloride 98 - 111 mmol/L 111 112(H) 109  CO2 22 - 32 mmol/L 20(L) 18(L) 19(L)  Calcium 8.9 - 10.3 mg/dL 8.4(L) 8.3(L) 8.6(L)  Total Protein 6.5 - 8.1 g/dL - - -  Total Bilirubin 0.3 - 1.2 mg/dL - - -  Alkaline Phos 38 - 126 U/L - - -  AST 15 - 41 U/L - - -  ALT 0 - 44 U/L - - -   Lipid Panel     Component Value Date/Time   CHOL 182 03/05/2016 0503   TRIG 118 03/05/2016 0503   HDL 38 (L) 03/05/2016 0503   CHOLHDL 4.8 03/05/2016 0503   VLDL 24 03/05/2016 0503   LDLCALC 120 (H) 03/05/2016 0503    CBC    Component Value Date/Time   WBC 6.7 06/15/2020 0222   RBC 3.89 (L) 06/15/2020 0222   HGB 11.1 (L) 06/15/2020 0222   HGB 11.9 (L) 07/27/2018 1006   HCT 35.4 (L) 06/15/2020 0222   HCT 35.1 (L) 07/27/2018 1006   PLT 200 06/15/2020 0222   PLT 205 07/27/2018 1006   MCV 91.0 06/15/2020 0222   MCV 88 07/27/2018 1006   MCH 28.5 06/15/2020 0222   MCHC 31.4 06/15/2020 0222   RDW 17.2 (H) 06/15/2020 0222   RDW 16.1 (H) 07/27/2018 1006   LYMPHSABS 0.6 (L) 11/26/2019 1007   LYMPHSABS 1.1  07/27/2018 1006   MONOABS 0.6 11/26/2019 1007   EOSABS 0.5 11/26/2019 1007   EOSABS 0.3 07/27/2018 1006   BASOSABS 0.0 11/26/2019 1007   BASOSABS 0.0 07/27/2018 1006    ASSESSMENT AND PLAN: 1. Hospital discharge follow-up  2. Chronic combined systolic and diastolic CHF (congestive heart failure) ([place]) Advised  and encourage patient to weigh himself at least twice a week.  If weight increases by more than 5 pounds he should call us or his cardiologist and take an extra dose of torsemide.  His weight today is up 5 pounds.  I have told him to take an extra dose of torsemide today Keep appt with cardiology -Patient currently works as a Horticulturist, commercial.  Given his chronic medical issues including CHF, advanced kidney disease, I recommend he consider applying for disability. I had our case worker meet with him today to discuss process - Basic Metabolic Panel  3. Chronic obstructive pulmonary disease, unspecified COPD type ([place]) Encourage patient to use the Symbicort twice a day every day regardless of how he feels.  I think this will help decrease the cough.  4. CKD (chronic kidney disease) stage 5, GFR less than 15 ml/min (HCC) -keep appt with nephrology  5. Secondary hyperparathyroidism of renal origin [facility])    Patient was given the opportunity to ask questions.  Patient verbalized understanding of the plan and was able to repeat key elements of the plan.   Orders Placed This Encounter  Procedures  . Basic Metabolic Panel     Requested Prescriptions    No prescriptions requested or ordered in this encounter    No follow-ups on file.  Karle Plumber, MD, FACP

## 2020-06-30 NOTE — Patient Instructions (Signed)
You should consider applying for disability.  Use your Symbicort twice a day every day regardless of how you feel.  It will help decrease the cough.  Your weight is up 5 pounds since hospital.  I would take an extra dose of torsemide today and then continue taking the torsemide 20 mg daily.

## 2020-07-01 LAB — BASIC METABOLIC PANEL
BUN/Creatinine Ratio: 6 — ABNORMAL LOW (ref 9–20)
BUN: 56 mg/dL — ABNORMAL HIGH (ref 6–24)
CO2: 21 mmol/L (ref 20–29)
Calcium: 9.5 mg/dL (ref 8.7–10.2)
Chloride: 103 mmol/L (ref 96–106)
Creatinine, Ser: 9.56 mg/dL — ABNORMAL HIGH (ref 0.76–1.27)
GFR calc Af Amer: 6 mL/min/{1.73_m2} — ABNORMAL LOW (ref >59)
GFR calc non Af Amer: 5 mL/min/{1.73_m2} — ABNORMAL LOW (ref >59)
Glucose: 100 mg/dL — ABNORMAL HIGH (ref 65–99)
Potassium: 4.1 mmol/L (ref 3.5–5.2)
Sodium: 145 mmol/L — ABNORMAL HIGH (ref 134–144)

## 2020-07-02 ENCOUNTER — Telehealth: Payer: Self-pay

## 2020-07-02 MED FILL — CALCITRIOL 0.5 MCG CAPS: 0.5 | 30 days supply | Qty: 30 | Fill #6

## 2020-07-02 NOTE — Telephone Encounter (Signed)
Triage nurse tried to contact pt to go over lab results pt didn't answer vm was left

## 2020-07-04 NOTE — Telephone Encounter (Signed)
This CM contacted [facility] regarding providing assistance for patient with application for disability. She requested referral with patient's approval  Call placed to patient to inform him of referral to [facility] for assistance with disability application.  Message left with call back requested to this CM # [phone]

## 2020-07-07 ENCOUNTER — Telehealth: Payer: Self-pay | Admitting: Internal Medicine

## 2020-07-07 NOTE — Telephone Encounter (Signed)
Call returned to patient [phone] regarding applying for disability. Message left with call back requested to this CM # [phone]

## 2020-07-07 NOTE — Telephone Encounter (Signed)
Please advise.   Copied from [place] [phone]. Topic: General - Other >> Jul 07, 2020 10:01 AM Rainey Pines A wrote: Patient returning [place] call and would like a callback

## 2020-07-07 NOTE — Telephone Encounter (Signed)
Patient is calling to speak to Nurse Opal Sidles RN Case management regarding applying for disability. CB- [phone]

## 2020-07-08 ENCOUNTER — Telehealth: Payer: Self-pay

## 2020-07-08 NOTE — Telephone Encounter (Signed)
Call received from the patient. Explained that the [facility] may be able to assist him with applying for disability.  He was in agreement to submitting a referral and provided the necessary demographic and personal information. He also provided verbal authorization to place the referral.   He inquired about information that his employer would need for him to take a LOA.  Instructed him to speak to his employer about the LOA and what documents he needs to complete or have his provider complete, possibly FMLA.  Also instructed him to call this CM back with any questions.   Referral sent to the [facility] via [place] email

## 2020-07-09 ENCOUNTER — Telehealth: Payer: Self-pay

## 2020-07-09 NOTE — Telephone Encounter (Signed)
Call returned to patient.  He said that he was contacted by [facility] and has an appointment to meet with them on 07/15/2020.   He has also spoken to his employer and his last day of work will be 07/11/2020.  They will be sending some documents for PCP to sign.  Provided the patient with [facility] fax #.

## 2020-07-09 NOTE — Telephone Encounter (Signed)
Patient called requesting to speak to Ms Opal Sidles was not able to locate her he would like a call back Ph# [phone]

## 2020-07-11 ENCOUNTER — Telehealth: Payer: Self-pay | Admitting: Internal Medicine

## 2020-07-11 NOTE — Telephone Encounter (Signed)
FYI  Returned pt call. Pt states his job made notify her that he is going on temporary disability.

## 2020-07-11 NOTE — Telephone Encounter (Signed)
Copied from [place] [phone]. Topic: General - Other >> Jul 08, 2020 10:06 AM Celene Kras wrote: Reason for CRM: Pt called and is requesting to speak with PCP regarding his disability. PT states that it is urgent. Please advise.

## 2020-07-17 ENCOUNTER — Other Ambulatory Visit: Payer: Self-pay

## 2020-07-17 ENCOUNTER — Ambulatory Visit (INDEPENDENT_AMBULATORY_CARE_PROVIDER_SITE_OTHER): Payer: BC Managed Care – PPO | Admitting: Emergency Medicine

## 2020-07-17 DIAGNOSIS — I255 Ischemic cardiomyopathy: Secondary | ICD-10-CM

## 2020-07-17 DIAGNOSIS — I5043 Acute on chronic combined systolic (congestive) and diastolic (congestive) heart failure: Secondary | ICD-10-CM | POA: Diagnosis not present

## 2020-07-17 LAB — CUP PACEART INCLINIC DEVICE CHECK
Date Time Interrogation Session: 20210812084639
Implantable Lead Implant Date: 20150507
Implantable Lead Location: 753862
Implantable Lead Model: 3400
Implantable Pulse Generator Implant Date: 20150507
Pulse Gen Model: 1010
Pulse Gen Serial Number: 14970

## 2020-07-17 NOTE — Progress Notes (Signed)
Subcutaneous ICD check in clinic. 0 untreated episodes; 0 treated episodes; 0 shocks delivered. Electrode impedance status okay. No programming changes. Remaining longevity to ERI 2%. Next in-clinic battery check scheduled for 08/19/20.

## 2020-07-22 ENCOUNTER — Telehealth: Payer: Self-pay

## 2020-07-22 ENCOUNTER — Telehealth: Payer: Self-pay | Admitting: Internal Medicine

## 2020-07-22 NOTE — Telephone Encounter (Signed)
Copied from [place] [phone]. Topic: General - Call Back - No Documentation >> Jul 22, 2020 11:25 AM Erick Blinks wrote: Reason for CRM: Pt called requesting to speak to Opal Sidles regarding short term disability paperwork. Please advise Best contact: [phone] >> Jul 22, 2020 11:28 AM Erick Blinks wrote: Pt would also like to speak to PCP

## 2020-07-22 NOTE — Telephone Encounter (Signed)
Call received from patient.  He spoke to his employer and they are requesting a letter from PCP stating how long he will be out of work so they can initiate the short term disability paperwork.   Fax # [phone] - attn: Angela Nevin [employer]

## 2020-07-30 ENCOUNTER — Other Ambulatory Visit: Payer: Self-pay | Admitting: Internal Medicine

## 2020-07-30 DIAGNOSIS — E785 Hyperlipidemia, unspecified: Secondary | ICD-10-CM

## 2020-07-30 DIAGNOSIS — I2510 Atherosclerotic heart disease of native coronary artery without angina pectoris: Secondary | ICD-10-CM

## 2020-07-30 MED FILL — ATORVASTATIN CALCIUM 40 MG: 40 | 30 days supply | Qty: 30 | Fill #0

## 2020-07-30 MED FILL — AMLODIPINE BESYLATE 10 MG T: 10 | 30 days supply | Qty: 30 | Fill #3

## 2020-07-30 MED FILL — hydrALAZINE HCL 10 MG TABS: 10 | 90 days supply | Qty: 360 | Fill #1

## 2020-08-15 MED FILL — CALCITRIOL 0.5 MCG CAPS: 0.5 | 30 days supply | Qty: 30 | Fill #7

## 2020-08-19 ENCOUNTER — Ambulatory Visit (INDEPENDENT_AMBULATORY_CARE_PROVIDER_SITE_OTHER): Payer: BC Managed Care – PPO | Admitting: Emergency Medicine

## 2020-08-19 ENCOUNTER — Other Ambulatory Visit: Payer: Self-pay

## 2020-08-19 DIAGNOSIS — I469 Cardiac arrest, cause unspecified: Secondary | ICD-10-CM | POA: Diagnosis not present

## 2020-08-19 DIAGNOSIS — I4901 Ventricular fibrillation: Secondary | ICD-10-CM

## 2020-08-19 DIAGNOSIS — Z95810 Presence of automatic (implantable) cardiac defibrillator: Secondary | ICD-10-CM | POA: Diagnosis not present

## 2020-08-19 LAB — CUP PACEART INCLINIC DEVICE CHECK
Date Time Interrogation Session: 20210914110541
Implantable Lead Implant Date: 20150507
Implantable Lead Location: 753862
Implantable Lead Model: 3400
Implantable Pulse Generator Implant Date: 20150507
Pulse Gen Model: 1010
Pulse Gen Serial Number: 14970

## 2020-08-19 NOTE — Progress Notes (Addendum)
Subcutaneous ICD check in clinic. ERI as of 07/25/20. No untreated episodes; no treated episodes; no shocks delivered. Electrode impedance status okay. No programming changes. Scheduled for virtual visit 08/19/20 with Dr. Rayann Heman. Will discuss with [facility] to clarify urgency of generator replacement.

## 2020-08-19 NOTE — Progress Notes (Signed)
Spoke with Shanda Howells, [place] tachy therapies Pharmacist, community.  Per engineering analysis of patient's device data as of 08/19/20: despite premature battery depletion advisory, ERI to EOS time is predicted at full 90 days once ERI triggered (07/25/20).  Encounter routed to Dr. Rayann Heman as Juluis Rainier in advance of virtual visit on 08/25/20.

## 2020-08-22 ENCOUNTER — Telehealth: Payer: Self-pay

## 2020-08-22 NOTE — Telephone Encounter (Signed)
Spoke with pt regarding virtual appt on 08/25/20. Pt stated he can not check his BP. Pt stated he did not have any questions regarding his appt and confirmed virtual visit.

## 2020-08-25 ENCOUNTER — Other Ambulatory Visit: Payer: Self-pay

## 2020-08-25 ENCOUNTER — Telehealth: Payer: Self-pay

## 2020-08-25 ENCOUNTER — Telehealth (INDEPENDENT_AMBULATORY_CARE_PROVIDER_SITE_OTHER): Payer: BC Managed Care – PPO | Admitting: Internal Medicine

## 2020-08-25 DIAGNOSIS — I4901 Ventricular fibrillation: Secondary | ICD-10-CM

## 2020-08-25 DIAGNOSIS — I469 Cardiac arrest, cause unspecified: Secondary | ICD-10-CM

## 2020-08-25 DIAGNOSIS — I5042 Chronic combined systolic (congestive) and diastolic (congestive) heart failure: Secondary | ICD-10-CM | POA: Diagnosis not present

## 2020-08-25 DIAGNOSIS — I255 Ischemic cardiomyopathy: Secondary | ICD-10-CM

## 2020-08-25 NOTE — Progress Notes (Signed)
Electrophysiology TeleHealth Note  Due to national recommendations of social distancing due to [place] 19, an audio telehealth visit is felt to be most appropriate for this patient at this time.  Verbal consent was obtained by me for the telehealth visit today.  The patient does not have capability for a virtual visit.  A phone visit is therefore required today.   Date:  08/25/2020   ID:  Victor Thompson, DOB March 22, 1961, MRN [id number]  Location: patient's home  Provider location:  [place]  Evaluation Performed: Follow-up visit  PCP:  [name], MD   Electrophysiologist:  Dr Rayann Heman  Chief Complaint:  palpitations  History of Present Illness:    Victor Thompson is a 59 y.o. male who presents via telehealth conferencing today.  Since last being seen in our clinic, the patient reports doing very well.  He is reasonably active.  SOB is stable.  Today, he denies symptoms of palpitations, chest pain, lower extremity edema, dizziness, presyncope, or syncope.  The patient is otherwise without complaint today.     Past Medical History:  Diagnosis Date  . AICD (automatic cardioverter/defibrillator) present    2015  . Asthma    no meds  . Cardiac arrest ([place]) 2015  . CHF (congestive heart failure) ([place])   . Chronic kidney disease    ckd -stage 5  . Coronary artery disease   . Hypertension   . Myocardial infarction ([place])   . Obesity   . Pneumonia   . Pulmonary edema   . Shortness of breath dyspnea   . Sleep apnea    USES CPAP  . Wears glasses     Past Surgical History:  Procedure Laterality Date  . AV FISTULA PLACEMENT Right 03/15/2019   Procedure: RIGHT ARM ARTERIOVENOUS (AV) FISTULA CREATION;  Surgeon: Angelia Mould, MD;  Location: [place];  Service: Vascular;  Laterality: Right;  . COLONOSCOPY    . COLONOSCOPY W/ BIOPSIES AND POLYPECTOMY    . CORONARY STENT PLACEMENT  2007  . IMPLANTABLE CARDIOVERTER DEFIBRILLATOR IMPLANT  2015    Current Outpatient  Medications  Medication Sig Dispense Refill  . albuterol (PROVENTIL) (2.5 MG/3ML) 0.083% nebulizer solution Take 3 mLs (2.5 mg total) by nebulization every 6 (six) hours as needed for wheezing or shortness of breath. 75 mL 12  . albuterol (VENTOLIN HFA) 108 (90 Base) MCG/ACT inhaler Inhale 2 puffs into the lungs every 4 (four) hours as needed for wheezing or shortness of breath. 8 g 6  . amLODipine (NORVASC) 10 MG tablet Take 1 tablet (10 mg total) by mouth daily. 90 tablet 1  . aspirin EC 81 MG tablet Take 1 tablet (81 mg total) by mouth daily. 30 tablet 5  . atorvastatin (LIPITOR) 40 MG tablet TAKE 1 TABLET (40 MG TOTAL) BY MOUTH AT BEDTIME. 30 tablet 1  . budesonide-formoterol (SYMBICORT) 80-4.5 MCG/ACT inhaler Inhale 2 puffs into the lungs 2 (two) times daily. 1 Inhaler 6  . calcitRIOL (ROCALTROL) 0.5 MCG capsule Take 0.5 mcg by mouth daily.   11  . carvedilol (COREG) 25 MG tablet Take 1 tablet (25 mg total) by mouth 2 (two) times daily with a meal. 180 tablet 1  . Cholecalciferol (VITAMIN D-3) 125 MCG (5000 UT) TABS Take 5,000 Units by mouth daily.     Marland Kitchen EPINEPHrine 0.3 mg/0.3 mL IJ SOAJ injection Inject 0.3 mLs (0.3 mg total) into the muscle once as needed for anaphylaxis. 1 each 1  . fluticasone (FLONASE) 50 MCG/ACT  nasal spray Place 1 spray into both nostrils daily. 16 g 2  . hydrALAZINE (APRESOLINE) 25 MG tablet Take 1 tablet (25 mg total) by mouth 3 (three) times daily. 90 tablet 1  . isosorbide mononitrate (IMDUR) 30 MG 24 hr tablet Take 1 tablet (30 mg total) by mouth daily. 90 tablet 1  . loratadine (CLARITIN) 10 MG tablet Take 10 mg by mouth daily as needed for allergies.    Marland Kitchen torsemide (DEMADEX) 20 MG tablet Take 2 tablets (40 mg total) by mouth 2 (two) times daily for 5 days. May take the medication 40 mg p.o. twice daily x5 days then daily, per instruction by cardiology and nephrologist. 20 tablet 0   No current facility-administered medications for this visit.    Allergies:    Ace inhibitors, Influenza vaccines, Ketorolac, Lidocaine, Lisinopril, and Penicillins   Social History:  The patient  reports that he has never smoked. He has never used smokeless tobacco. He reports that he does not drink alcohol and does not use drugs.   ROS:  Please see the history of present illness.   All other systems are personally reviewed and negative.    Exam:    Vital Signs:  There were no vitals taken for this visit.  Well sounding, alert and conversant   Labs/Other Tests and Data Reviewed:    Recent Labs: 11/26/2019: ALT 15 06/14/2020: B Natriuretic Peptide 931.6 06/15/2020: Hemoglobin 11.1; Magnesium 2.1; Platelets 200 06/30/2020: BUN 56; Creatinine, Ser 9.56; Potassium 4.1; Sodium 145   Wt Readings from Last 3 Encounters:  06/30/20 (!) 268 lb 9.6 oz (121.8 kg)  06/15/20 263 lb 14.4 oz (119.7 kg)  04/07/20 258 lb (117 kg)     ASSESSMENT & PLAN:    1.  Prior VF arrest  His ICD is at [facility]. He has a subQ ICD.  I have spoken with Dr Lovena Le who will assist with generator change.  We will plan to use general anesthesia to allow for DFT testing. Risks, benefits, and alternatives to S- ICD pulse generator replacement were discussed in detail today.  The patient understands that risks include but are not limited to bleeding, infection, MI, stroke, death, inappropriate shocks, damage to his existing leads, and lead dislodgement and wishes to proceed.  We will therefore schedule the procedure at the next available time.  2. Ischemic CM/ chronic systolic dysfunction EF remains 30-35% No ischemic symptoms He has QRS < 150 msec.  I would therefore not advise upgrade to CRT at this time. Continue medicine optimization   Overdue for general cardiology follow-up.  Risks, benefits and potential toxicities for medications prescribed and/or refilled reviewed with patient today.    Patient Risk:  after full review of this patients clinical status, I feel that they are at moderate  risk at this time.  Today, I have spent 15 minutes with the patient with telehealth technology discussing arrhythmia management .    Army Fossa, MD  08/25/2020 12:33 PM     [place] [id number] [phone] (office) [phone] (fax)

## 2020-08-25 NOTE — Telephone Encounter (Signed)
-----   Message from [name], MD sent at 08/25/2020 12:49 PM EDT ----- Patients S-ICD is at [facility].  Please schedule S-ICD generator change with Dr Lovena Le.  Will need anesthesia as per Dr Lovena Le.

## 2020-08-28 NOTE — Telephone Encounter (Signed)
Pt returning Nurse Jenny's call

## 2020-08-28 NOTE — Telephone Encounter (Signed)
Patient returned your call.

## 2020-08-28 NOTE — Telephone Encounter (Signed)
Left message for Pt to return this nurse call.  Pt needs to be scheduled for subcut ICD generator change prior to 10/25/2020  Dr. Lovena Le will be performing procedure.  Need to speak with Pt to see if he would prefer an OV to meet Dr. Lovena Le prior to procedure or ok to schedule and Dr. Lovena Le will meet with Pt on day of procedure.  Requested call back to this nurse.

## 2020-09-02 NOTE — Telephone Encounter (Signed)
Patient returning call.

## 2020-09-02 NOTE — Telephone Encounter (Signed)
LMTCB

## 2020-09-02 NOTE — Telephone Encounter (Signed)
Spoke with Pt.  He does not request an office visit with Dr. Lovena Le prior to gen change.  Pt scheduled for October 13, 2020 at 7:30 am for subcut icd gen change  Will get labs/covid test on November 5  Will mail instruction letter to Pt.

## 2020-09-15 MED FILL — ATORVASTATIN CALCIUM 40 MG: 40 | 30 days supply | Qty: 30 | Fill #1

## 2020-09-15 MED FILL — AMLODIPINE BESYLATE 10 MG T: 10 | 30 days supply | Qty: 30 | Fill #4

## 2020-09-21 ENCOUNTER — Other Ambulatory Visit: Payer: Self-pay

## 2020-09-21 ENCOUNTER — Observation Stay (HOSPITAL_COMMUNITY)
Admission: EM | Admit: 2020-09-21 | Discharge: 2020-09-23 | Disposition: A | Discharge status: Home / Self Care | Payer: 59 | Attending: Internal Medicine | Admitting: Internal Medicine

## 2020-09-21 DIAGNOSIS — I2510 Atherosclerotic heart disease of native coronary artery without angina pectoris: Secondary | ICD-10-CM | POA: Diagnosis present

## 2020-09-21 DIAGNOSIS — G4733 Obstructive sleep apnea (adult) (pediatric): Secondary | ICD-10-CM | POA: Diagnosis present

## 2020-09-21 DIAGNOSIS — I5020 Unspecified systolic (congestive) heart failure: Secondary | ICD-10-CM | POA: Diagnosis present

## 2020-09-21 DIAGNOSIS — E785 Hyperlipidemia, unspecified: Secondary | ICD-10-CM | POA: Diagnosis present

## 2020-09-21 DIAGNOSIS — J45909 Unspecified asthma, uncomplicated: Secondary | ICD-10-CM | POA: Diagnosis present

## 2020-09-21 DIAGNOSIS — I132 Hypertensive heart and chronic kidney disease with heart failure and with stage 5 chronic kidney disease, or end stage renal disease: Secondary | ICD-10-CM | POA: Insufficient documentation

## 2020-09-21 DIAGNOSIS — I5042 Chronic combined systolic (congestive) and diastolic (congestive) heart failure: Secondary | ICD-10-CM | POA: Diagnosis not present

## 2020-09-21 DIAGNOSIS — I10 Essential (primary) hypertension: Secondary | ICD-10-CM | POA: Diagnosis present

## 2020-09-21 DIAGNOSIS — Z79899 Other long term (current) drug therapy: Secondary | ICD-10-CM | POA: Insufficient documentation

## 2020-09-21 DIAGNOSIS — E66813 Obesity, class 3: Secondary | ICD-10-CM

## 2020-09-21 DIAGNOSIS — N185 Chronic kidney disease, stage 5: Secondary | ICD-10-CM | POA: Diagnosis present

## 2020-09-21 DIAGNOSIS — T783XXA Angioneurotic edema, initial encounter: Principal | ICD-10-CM | POA: Diagnosis present

## 2020-09-21 DIAGNOSIS — Z20822 Contact with and (suspected) exposure to covid-19: Secondary | ICD-10-CM | POA: Diagnosis not present

## 2020-09-21 DIAGNOSIS — N189 Chronic kidney disease, unspecified: Secondary | ICD-10-CM

## 2020-09-21 DIAGNOSIS — K148 Other diseases of tongue: Secondary | ICD-10-CM | POA: Diagnosis present

## 2020-09-21 DIAGNOSIS — Z7982 Long term (current) use of aspirin: Secondary | ICD-10-CM | POA: Insufficient documentation

## 2020-09-21 DIAGNOSIS — D631 Anemia in chronic kidney disease: Secondary | ICD-10-CM

## 2020-09-21 DIAGNOSIS — Z95810 Presence of automatic (implantable) cardiac defibrillator: Secondary | ICD-10-CM | POA: Insufficient documentation

## 2020-09-21 DIAGNOSIS — Z6841 Body Mass Index (BMI) 40.0 and over, adult: Secondary | ICD-10-CM

## 2020-09-21 DIAGNOSIS — R220 Localized swelling, mass and lump, head: Secondary | ICD-10-CM

## 2020-09-21 DIAGNOSIS — R197 Diarrhea, unspecified: Secondary | ICD-10-CM

## 2020-09-21 MED ORDER — METHYLPREDNISOLONE SODIUM SUCC 125 MG IJ SOLR
INTRAMUSCULAR | Status: AC
  Administered 2020-09-21: 125 mg
  Filled 2020-09-21: qty 2

## 2020-09-21 MED ORDER — EPINEPHRINE 0.3 MG/0.3ML IJ SOAJ
INTRAMUSCULAR | Status: AC
  Filled 2020-09-21: qty 0.3

## 2020-09-21 MED ORDER — DIPHENHYDRAMINE HCL 50 MG/ML IJ SOLN
50.0000 mg | Freq: Once | INTRAMUSCULAR | Status: AC
  Administered 2020-09-21: 50 mg via INTRAVENOUS
  Filled 2020-09-21: qty 1

## 2020-09-21 MED ORDER — FAMOTIDINE IN NACL 20-0.9 MG/50ML-% IV SOLN
20.0000 mg | Freq: Once | INTRAVENOUS | Status: AC
  Administered 2020-09-21: 20 mg via INTRAVENOUS
  Filled 2020-09-21: qty 50

## 2020-09-21 NOTE — ED Notes (Signed)
Provider placed iv using ultra-sound. Pepcid started.  Pt is able to communicate using one word statements.  Reports it is not getting worse at this time.

## 2020-09-21 NOTE — ED Provider Notes (Signed)
[facility] EMERGENCY DEPARTMENT Provider Note   CSN: [id number] Arrival date & time: 09/21/20  2155     History No chief complaint on file.   Victor Thompson is a 59 y.o. male.  59 yo M with a chief complaints of tongue swelling difficulty itching and diarrhea.  This started after he got home after having out back.  No prior history of allergies.  Denies difficulty breathing.  The history is provided by the patient.  Illness Severity:  Moderate Onset quality:  Gradual Duration:  2 hours Timing:  Constant Progression:  Unchanged Chronicity:  New Associated symptoms: diarrhea   Associated symptoms: no abdominal pain, no chest pain, no congestion, no fever, no headaches, no myalgias, no rash, no shortness of breath and no vomiting        Past Medical History:  Diagnosis Date  . AICD (automatic cardioverter/defibrillator) present    2015  . Asthma    no meds  . Cardiac arrest ([place]) 2015  . CHF (congestive heart failure) ([place])   . Chronic kidney disease    ckd -stage 5  . Coronary artery disease   . Hypertension   . Myocardial infarction ([place])   . Obesity   . Pneumonia   . Pulmonary edema   . Shortness of breath dyspnea   . Sleep apnea    USES CPAP  . Wears glasses     Patient Active Problem List   Diagnosis Date Noted  . Acute on chronic combined systolic and diastolic CHF (congestive heart failure) ([place]) 06/14/2020  . Secondary hyperparathyroidism of renal origin ([place]) 07/15/2019  . Vitamin D deficiency 07/15/2019  . CAD (coronary artery disease), native coronary artery 04/10/2019  . CKD (chronic kidney disease) stage 5, GFR less than 15 ml/min ([facility]) 09/26/2018  . Chronic midline low back pain without sciatica 02/17/2018  . VT (ventricular tachycardia) ([place]) 03/29/2016  . Defibrillator discharge   . Chronic combined systolic and diastolic CHF (congestive heart failure) ([place]) 01/22/2016  . Erectile dysfunction 01/22/2016  . Allergic  rhinitis 01/22/2016  . GERD (gastroesophageal reflux disease) 01/22/2016  . PROTEINURIA 02/17/2010  . Cardiomyopathy, ischemic 02/16/2010  . Obstructive sleep apnea 02/16/2010  . EXTERNAL HEMORRHOIDS 09/26/2009  . Tobacco use disorder in remission 06/03/2009  . Essential hypertension 10/01/2008  . Hyperlipidemia 07/14/2007  . Class 3 severe obesity due to excess calories with serious comorbidity and body mass index (BMI) of 40.0 to 44.9 in adult ([place]) 07/14/2007  . Asthma 07/14/2007    Past Surgical History:  Procedure Laterality Date  . AV FISTULA PLACEMENT Right 03/15/2019   Procedure: RIGHT ARM ARTERIOVENOUS (AV) FISTULA CREATION;  Surgeon: Angelia Mould, MD;  Location: [facility];  Service: Vascular;  Laterality: Right;  . COLONOSCOPY    . COLONOSCOPY W/ BIOPSIES AND POLYPECTOMY    . CORONARY STENT PLACEMENT  2007  . IMPLANTABLE CARDIOVERTER DEFIBRILLATOR IMPLANT  2015       Family History  Problem Relation Age of Onset  . Hypertension Mother   . Liver disease Father     Social History   Tobacco Use  . Smoking status: Never Smoker  . Smokeless tobacco: Never Used  Vaping Use  . Vaping Use: Never used  Substance Use Topics  . Alcohol use: No  . Drug use: No    Home Medications Prior to Admission medications   Medication Sig Start Date End Date Taking? Authorizing Provider  albuterol (PROVENTIL) (2.5 MG/3ML) 0.083% nebulizer solution Take 3 mLs (2.5 mg  total) by nebulization every 6 (six) hours as needed for wheezing or shortness of breath. 10/01/19   [name], MD  albuterol (VENTOLIN HFA) 108 (90 Base) MCG/ACT inhaler Inhale 2 puffs into the lungs every 4 (four) hours as needed for wheezing or shortness of breath. 10/01/19   [name], MD  amLODipine (NORVASC) 10 MG tablet Take 1 tablet (10 mg total) by mouth daily. 03/24/20   [name], MD  aspirin EC 81 MG tablet Take 1 tablet (81 mg total) by mouth daily. 01/22/16   [name], [name],  MD  atorvastatin (LIPITOR) 40 MG tablet TAKE 1 TABLET (40 MG TOTAL) BY MOUTH AT BEDTIME. 07/30/20   [name], MD  budesonide-formoterol [facility]) 80-4.5 MCG/ACT inhaler Inhale 2 puffs into the lungs 2 (two) times daily. 06/04/20   [name], MD  calcitRIOL (ROCALTROL) 0.5 MCG capsule Take 0.5 mcg by mouth daily.  08/16/18   [provider]  carvedilol (COREG) 25 MG tablet Take 1 tablet (25 mg total) by mouth 2 (two) times daily with a meal. 10/01/19   [name], MD  Cholecalciferol (VITAMIN D-3) 125 MCG (5000 UT) TABS Take 5,000 Units by mouth daily.     [provider]  EPINEPHrine 0.3 mg/0.3 mL IJ SOAJ injection Inject 0.3 mLs (0.3 mg total) into the muscle once as needed for anaphylaxis. 03/24/20   [name], MD  fluticasone (FLONASE) 50 MCG/ACT nasal spray Place 1 spray into both nostrils daily. 12/31/18   Volanda Napoleon, PA-C  hydrALAZINE (APRESOLINE) 25 MG tablet Take 1 tablet (25 mg total) by mouth 3 (three) times daily. 06/15/20 08/19/20  [name], MD  isosorbide mononitrate (IMDUR) 30 MG 24 hr tablet Take 1 tablet (30 mg total) by mouth daily. 10/01/19   [name], MD  loratadine (CLARITIN) 10 MG tablet Take 10 mg by mouth daily as needed for allergies.    [provider]  torsemide (DEMADEX) 20 MG tablet Take 2 tablets (40 mg total) by mouth 2 (two) times daily for 5 days. May take the medication 40 mg p.o. twice daily x5 days then daily, per instruction by cardiology and nephrologist. 06/15/20 08/19/20  [name], MD    Allergies    Ace inhibitors, Influenza vaccines, Ketorolac, Lidocaine, Lisinopril, and Penicillins  Review of Systems   Review of Systems  Constitutional: Negative for chills and fever.  HENT: Positive for trouble swallowing. Negative for congestion and facial swelling.   Eyes: Negative for discharge and visual disturbance.  Respiratory: Negative for shortness of breath.    Cardiovascular: Negative for chest pain and palpitations.  Gastrointestinal: Positive for diarrhea. Negative for abdominal pain and vomiting.  Musculoskeletal: Negative for arthralgias and myalgias.  Skin: Negative for color change and rash.  Neurological: Negative for tremors, syncope and headaches.  Psychiatric/Behavioral: Negative for confusion and dysphoric mood.    Physical Exam Updated Vital Signs BP (!) 159/99   Pulse (!) 127   Resp (!) 9   SpO2 94%   Physical Exam Vitals and nursing note reviewed.  Constitutional:      Appearance: He is well-developed.  HENT:     Head: Normocephalic and atraumatic.     Comments: Significant tongue swelling, tolerating secretions without difficulty Eyes:     Pupils: Pupils are equal, round, and reactive to light.  Neck:     Vascular: No JVD.  Cardiovascular:     Rate and Rhythm: Normal rate and regular rhythm.  Heart sounds: No murmur heard.  No friction rub. No gallop.   Pulmonary:     Effort: No respiratory distress.     Breath sounds: No wheezing.  Abdominal:     General: There is no distension.     Tenderness: There is no abdominal tenderness. There is no guarding or rebound.  Musculoskeletal:        General: Normal range of motion.     Cervical back: Normal range of motion and neck supple.  Skin:    Coloration: Skin is not pale.     Findings: No rash.  Neurological:     Mental Status: He is alert and oriented to person, place, and time.  Psychiatric:        Behavior: Behavior normal.     ED Results / Procedures / Treatments   Labs (all labs ordered are listed, but only abnormal results are displayed) Labs Reviewed - No data to display  EKG EKG Interpretation  Date/Time:  Sunday September 21 2020 22:41:34 EDT Ventricular Rate:  75 PR Interval:    QRS Duration: 158 QT Interval:  452 QTC Calculation: 505 R Axis:   -78 Text Interpretation: duplicate, discard Confirmed by Glick, David ([id number]) on 09/21/2020  11:36:14 PM   Radiology No results found.  Procedures Procedures (including critical care time) Procedure note: Ultrasound Guided Peripheral IV Ultrasound guided peripheral 1.88 inch angiocath IV placement performed by me. Indications: Nursing unable to place IV. Details: The antecubital fossa and upper arm were evaluated with a multifrequency linear probe. Patent brachial veins were noted. 1 attempt was made to cannulate a vein under realtime US guidance with successful cannulation of the vein and catheter placement. There is return of non-pulsatile dark red blood. The patient tolerated the procedure well without complications. Images archived electronically.  CPT codes: 76937 and 36410  Medications Ordered in ED Medications  EPINEPHrine (EPI-PEN) 0.3 mg/0.3 mL injection (  Given 09/21/20 2218)  methylPREDNISolone sodium succinate (SOLU-MEDROL) 125 mg/2 mL injection (125 mg  Given 09/21/20 2218)  diphenhydrAMINE (BENADRYL) injection 50 mg (50 mg Intravenous Given 09/21/20 2250)  famotidine (PEPCID) IVPB 20 mg premix (20 mg Intravenous New Bag/Given 09/21/20 2240)    ED Course  I have reviewed the triage vital signs and the nursing notes.  Pertinent labs & imaging results that were available during my care of the patient were reviewed by me and considered in my medical decision making (see chart for details).    MDM Rules/Calculators/A&P                          58  y o M with a cc of a possible allergic reaction.  Occurred after eating at [facility] with diarrhea, tongue swelling and diffuse itching.  Will give epi IM.  Solumendrol, H1/2 blockers. Not on ACEI.  Observe.   Reassessed multiple times.  No obvious worsening, patient feels has improved somewhat.  Will continue to obs.   Signed out to Dr. Roxanne Mins, please see their note for further details of care in the ED.   CRITICAL CARE Performed by: Cecilio Asper   Total critical care time: 35 minutes  Critical care time was  exclusive of separately billable procedures and treating other patients.  Critical care was necessary to treat or prevent imminent or life-threatening deterioration.  Critical care was time spent personally by me on the following activities: development of treatment plan with patient and/or surrogate as well as nursing, discussions with consultants,  evaluation of patient's response to treatment, examination of patient, obtaining history from patient or surrogate, ordering and performing treatments and interventions, ordering and review of laboratory studies, ordering and review of radiographic studies, pulse oximetry and re-evaluation of patient's condition.   The patients results and plan were reviewed and discussed.   Any x-rays performed were independently reviewed by myself.   Differential diagnosis were considered with the presenting HPI.  Medications  EPINEPHrine (EPI-PEN) 0.3 mg/0.3 mL injection (  Given 09/21/20 2218)  methylPREDNISolone sodium succinate (SOLU-MEDROL) 125 mg/2 mL injection (125 mg  Given 09/21/20 2218)  diphenhydrAMINE (BENADRYL) injection 50 mg (50 mg Intravenous Given 09/21/20 2250)  famotidine (PEPCID) IVPB 20 mg premix (20 mg Intravenous New Bag/Given 09/21/20 2240)    Vitals:   09/21/20 2218 09/21/20 2219 09/21/20 2220 09/21/20 2249  BP:   (!) 145/104 (!) 159/99  Pulse: 68 77 73 (!) 127  Resp: 18 (!) 23 (!) 24 (!) 9  SpO2: 92% 91% 91% 94%    Final diagnoses:  Tongue swelling  Diarrhea, unspecified type        Final Clinical Impression(s) / ED Diagnoses Final diagnoses:  Tongue swelling  Diarrhea, unspecified type    Rx / DC Orders ED Discharge Orders    None       [name], DO 09/21/20 2337

## 2020-09-21 NOTE — ED Notes (Signed)
Pt brought back to Trauma C, 20 G left arm. Epi given

## 2020-09-22 ENCOUNTER — Other Ambulatory Visit: Payer: Self-pay

## 2020-09-22 DIAGNOSIS — I5042 Chronic combined systolic (congestive) and diastolic (congestive) heart failure: Secondary | ICD-10-CM

## 2020-09-22 DIAGNOSIS — J4520 Mild intermittent asthma, uncomplicated: Secondary | ICD-10-CM | POA: Diagnosis not present

## 2020-09-22 DIAGNOSIS — I10 Essential (primary) hypertension: Secondary | ICD-10-CM

## 2020-09-22 DIAGNOSIS — T783XXA Angioneurotic edema, initial encounter: Secondary | ICD-10-CM | POA: Diagnosis present

## 2020-09-22 DIAGNOSIS — Z6841 Body Mass Index (BMI) 40.0 and over, adult: Secondary | ICD-10-CM

## 2020-09-22 DIAGNOSIS — I2510 Atherosclerotic heart disease of native coronary artery without angina pectoris: Secondary | ICD-10-CM | POA: Diagnosis not present

## 2020-09-22 DIAGNOSIS — N185 Chronic kidney disease, stage 5: Secondary | ICD-10-CM

## 2020-09-22 LAB — RESPIRATORY PANEL BY RT PCR (FLU A&B, COVID)
Influenza A by PCR: NEGATIVE
Influenza B by PCR: NEGATIVE
SARS Coronavirus 2 by RT PCR: NEGATIVE

## 2020-09-22 LAB — BASIC METABOLIC PANEL
Anion gap: 15 (ref 5–15)
BUN: 72 mg/dL — ABNORMAL HIGH (ref 6–20)
CO2: 18 mmol/L — ABNORMAL LOW (ref 22–32)
Calcium: 9.1 mg/dL (ref 8.9–10.3)
Chloride: 107 mmol/L (ref 98–111)
Creatinine, Ser: 9.36 mg/dL — ABNORMAL HIGH (ref 0.61–1.24)
GFR, Estimated: 6 mL/min — ABNORMAL LOW (ref >60)
Glucose, Bld: 176 mg/dL — ABNORMAL HIGH (ref 70–99)
Potassium: 4 mmol/L (ref 3.5–5.1)
Sodium: 140 mmol/L (ref 135–145)

## 2020-09-22 LAB — CBC WITH DIFFERENTIAL/PLATELET
Abs Immature Granulocytes: 0.03 10*3/uL (ref 0.00–0.07)
Basophils Absolute: 0 10*3/uL (ref 0.0–0.1)
Basophils Relative: 0 %
Eosinophils Absolute: 0 10*3/uL (ref 0.0–0.5)
Eosinophils Relative: 0 %
HCT: 38 % — ABNORMAL LOW (ref 39.0–52.0)
Hemoglobin: 12.3 g/dL — ABNORMAL LOW (ref 13.0–17.0)
Immature Granulocytes: 0 %
Lymphocytes Relative: 4 %
Lymphs Abs: 0.5 10*3/uL — ABNORMAL LOW (ref 0.7–4.0)
MCH: 29.6 pg (ref 26.0–34.0)
MCHC: 32.4 g/dL (ref 30.0–36.0)
MCV: 91.3 fL (ref 80.0–100.0)
Monocytes Absolute: 0.1 10*3/uL (ref 0.1–1.0)
Monocytes Relative: 1 %
Neutro Abs: 9.5 10*3/uL — ABNORMAL HIGH (ref 1.7–7.7)
Neutrophils Relative %: 95 %
Platelets: 235 10*3/uL (ref 150–400)
RBC: 4.16 MIL/uL — ABNORMAL LOW (ref 4.22–5.81)
RDW: 16.5 % — ABNORMAL HIGH (ref 11.5–15.5)
WBC: 10.1 10*3/uL (ref 4.0–10.5)
nRBC: 0 % (ref 0.0–0.2)

## 2020-09-22 MED ORDER — HEPARIN SODIUM (PORCINE) 5000 UNIT/ML IJ SOLN
5000.0000 [IU] | Freq: Three times a day (TID) | INTRAMUSCULAR | Status: DC
  Filled 2020-09-22 (×2): qty 1

## 2020-09-22 MED ORDER — CALCIUM CARBONATE ANTACID 1250 MG/5ML PO SUSP: 500.0000 mg | Freq: Four times a day (QID) | ORAL | Status: DC | PRN

## 2020-09-22 MED ORDER — FLUTICASONE FUROATE-VILANTEROL 100-25 MCG/INH IN AEPB
1.0000 | INHALATION_SPRAY | Freq: Every day | RESPIRATORY_TRACT | Status: DC
  Administered 2020-09-22: 1 via RESPIRATORY_TRACT
  Filled 2020-09-22: qty 28

## 2020-09-22 MED ORDER — CALCITRIOL 0.5 MCG PO CAPS
0.5000 ug | ORAL_CAPSULE | Freq: Every day | ORAL | Status: DC
  Administered 2020-09-22 – 2020-09-23 (×2): 0.5 ug via ORAL
  Filled 2020-09-22 (×2): qty 1

## 2020-09-22 MED ORDER — ACETAMINOPHEN 650 MG RE SUPP: 650.0000 mg | Freq: Four times a day (QID) | RECTAL | Status: DC | PRN

## 2020-09-22 MED ORDER — ONDANSETRON HCL 4 MG/2ML IJ SOLN: 4.0000 mg | Freq: Four times a day (QID) | INTRAMUSCULAR | Status: DC | PRN

## 2020-09-22 MED ORDER — ASPIRIN EC 81 MG PO TBEC
81.0000 mg | DELAYED_RELEASE_TABLET | Freq: Every day | ORAL | Status: DC
  Administered 2020-09-22 – 2020-09-23 (×2): 81 mg via ORAL
  Filled 2020-09-22 (×2): qty 1

## 2020-09-22 MED ORDER — SORBITOL 70 % SOLN: 30.0000 mL | Status: DC | PRN

## 2020-09-22 MED ORDER — EPINEPHRINE 0.3 MG/0.3ML IJ SOAJ: 0.3000 mg | Freq: Once | INTRAMUSCULAR | Status: DC | PRN

## 2020-09-22 MED ORDER — CAMPHOR-MENTHOL 0.5-0.5 % EX LOTN: 1.0000 "application " | TOPICAL_LOTION | Freq: Three times a day (TID) | CUTANEOUS | Status: DC | PRN

## 2020-09-22 MED ORDER — CARVEDILOL 12.5 MG PO TABS
25.0000 mg | ORAL_TABLET | Freq: Two times a day (BID) | ORAL | Status: DC
  Administered 2020-09-22 (×2): 25 mg via ORAL
  Filled 2020-09-22 (×2): qty 2

## 2020-09-22 MED ORDER — ALBUTEROL SULFATE (2.5 MG/3ML) 0.083% IN NEBU: 2.5000 mg | INHALATION_SOLUTION | RESPIRATORY_TRACT | Status: DC | PRN

## 2020-09-22 MED ORDER — DIPHENHYDRAMINE HCL 25 MG PO CAPS
25.0000 mg | ORAL_CAPSULE | Freq: Four times a day (QID) | ORAL | Status: DC
  Administered 2020-09-22 – 2020-09-23 (×6): 25 mg via ORAL
  Filled 2020-09-22 (×6): qty 1

## 2020-09-22 MED ORDER — AMLODIPINE BESYLATE 5 MG PO TABS
10.0000 mg | ORAL_TABLET | Freq: Every day | ORAL | Status: DC
  Administered 2020-09-22: 10 mg via ORAL
  Filled 2020-09-22: qty 2

## 2020-09-22 MED ORDER — HYDRALAZINE HCL 25 MG PO TABS
25.0000 mg | ORAL_TABLET | Freq: Three times a day (TID) | ORAL | Status: DC
  Administered 2020-09-22 (×3): 25 mg via ORAL
  Filled 2020-09-22 (×3): qty 1

## 2020-09-22 MED ORDER — HYDROXYZINE HCL 25 MG PO TABS: 25.0000 mg | ORAL_TABLET | Freq: Three times a day (TID) | ORAL | Status: DC | PRN

## 2020-09-22 MED ORDER — ISOSORBIDE MONONITRATE ER 30 MG PO TB24
30.0000 mg | ORAL_TABLET | Freq: Every day | ORAL | Status: DC
  Administered 2020-09-22 – 2020-09-23 (×2): 30 mg via ORAL
  Filled 2020-09-22 (×2): qty 1

## 2020-09-22 MED ORDER — FAMOTIDINE 20 MG PO TABS
20.0000 mg | ORAL_TABLET | Freq: Two times a day (BID) | ORAL | Status: DC
  Administered 2020-09-22 – 2020-09-23 (×3): 20 mg via ORAL
  Filled 2020-09-22 (×3): qty 1

## 2020-09-22 MED ORDER — TORSEMIDE 20 MG PO TABS
40.0000 mg | ORAL_TABLET | Freq: Every day | ORAL | Status: DC
  Administered 2020-09-22 – 2020-09-23 (×2): 40 mg via ORAL
  Filled 2020-09-22 (×2): qty 2

## 2020-09-22 MED ORDER — ONDANSETRON HCL 4 MG PO TABS: 4.0000 mg | ORAL_TABLET | Freq: Four times a day (QID) | ORAL | Status: DC | PRN

## 2020-09-22 MED ORDER — ZOLPIDEM TARTRATE 5 MG PO TABS: 5.0000 mg | ORAL_TABLET | Freq: Every evening | ORAL | Status: DC | PRN

## 2020-09-22 MED ORDER — HYDRALAZINE HCL 20 MG/ML IJ SOLN: 5.0000 mg | INTRAMUSCULAR | Status: DC | PRN

## 2020-09-22 MED ORDER — ATORVASTATIN CALCIUM 40 MG PO TABS
40.0000 mg | ORAL_TABLET | Freq: Every day | ORAL | Status: DC
  Administered 2020-09-22: 40 mg via ORAL
  Filled 2020-09-22 (×2): qty 1

## 2020-09-22 MED ORDER — DOCUSATE SODIUM 283 MG RE ENEM: 1.0000 | ENEMA | RECTAL | Status: DC | PRN

## 2020-09-22 MED ORDER — ACETAMINOPHEN 325 MG PO TABS: 650.0000 mg | ORAL_TABLET | Freq: Four times a day (QID) | ORAL | Status: DC | PRN

## 2020-09-22 MED ORDER — FLUTICASONE PROPIONATE 50 MCG/ACT NA SUSP
1.0000 | Freq: Every day | NASAL | Status: DC
  Administered 2020-09-22 – 2020-09-23 (×2): 1 via NASAL
  Filled 2020-09-22: qty 16

## 2020-09-22 MED ORDER — METHYLPREDNISOLONE SODIUM SUCC 125 MG IJ SOLR
80.0000 mg | Freq: Two times a day (BID) | INTRAMUSCULAR | Status: DC
  Administered 2020-09-22 – 2020-09-23 (×3): 80 mg via INTRAVENOUS
  Filled 2020-09-22 (×3): qty 2

## 2020-09-22 MED ORDER — NEPRO/CARBSTEADY PO LIQD
237.0000 mL | Freq: Three times a day (TID) | ORAL | Status: DC | PRN
  Filled 2020-09-22: qty 237

## 2020-09-22 NOTE — H&P (Signed)
History and Physical    Victor Thompson AQT:[id number] DOB: March 06, 1961 DOA: 09/21/2020  PCP: [name], MD Consultants:  Allred - cardiology; Joelyn Oms - nephrology Patient coming from:  Home - lives with cousin; [place]: Cousin  Chief Complaint: Tongue swelling  HPI: Victor Thompson is a 59 y.o. male with medical history significant of OSA on CPAP; CAD; stage 5 CKD; HTN; and AICD placement presenting with tongue swelling.  He reports episodic tongue swelling which started when he was in prison years ago.  He has had maybe 15 lifetime episodes, most recently last May.  Generally, his hands start to tingle, then his feet tingle, and then his tongue swells.  It usually resolves after he takes Benadryl, an Epipen, and prednisone.  He has seen a specialist but they did not find a reason for the symptoms.  Last night, he went to eat at [facility] with his girlfriend.  He developed symptoms about 915 and tried his usual measures; since he didn't have relief he drove in.  On the drive, his symptoms got significantly worse and he felt like he was having difficulty keeping his tongue in his mouth.  Upon arrival, he was given Benadryl and Pepcid x 1, about 1015PM.  He has not received additional treatment and still "feels funny."    He has stage 5 CKD and has a fistula but has not yet started HD.  He saw Dr. Joelyn Oms 3 weeks ago and is due to see him again in 3 more weeks.  He is also due to have his AICD replaced soon.    ED Course:  Carryover, per Dr. Tonie Griffith:  Patient presented for evaluation of swelling of his tongue. It after eating at [facility] last night. No respiratory symptoms. Has been given Pepcid and Benadryl as well as steroids in the emergency room. Was also given epinephrine. Has been in the emergency room for 8 hours. One called about placing patient in observation ER doctor reported patient standing is improved and not as swollen and patient is now speaking in sentences and has no  respiratory complaints but feels he should be observed and could possibly go home this afternoon  Review of Systems: As per HPI; otherwise review of systems reviewed and negative.   Ambulatory Status:  Ambulates without assistance  COVID Vaccine Status:  Complete  Past Medical History:  Diagnosis Date  . AICD (automatic cardioverter/defibrillator) present    2015  . Asthma    no meds  . Cardiac arrest ([place]) 2015  . CHF (congestive heart failure) ([place])   . Chronic kidney disease    ckd -stage 5  . Coronary artery disease   . Hypertension   . Myocardial infarction ([place])   . Obesity   . Pneumonia   . Pulmonary edema   . Shortness of breath dyspnea   . Sleep apnea    USES CPAP  . Wears glasses     Past Surgical History:  Procedure Laterality Date  . AV FISTULA PLACEMENT Right 03/15/2019   Procedure: RIGHT ARM ARTERIOVENOUS (AV) FISTULA CREATION;  Surgeon: Angelia Mould, MD;  Location: [facility];  Service: Vascular;  Laterality: Right;  . COLONOSCOPY    . COLONOSCOPY W/ BIOPSIES AND POLYPECTOMY    . CORONARY STENT PLACEMENT  2007  . IMPLANTABLE CARDIOVERTER DEFIBRILLATOR IMPLANT  2015    Social History   Socioeconomic History  . Marital status: Single    Spouse name: Not on file  . Number of children: Not on file  .  Years of education: Not on file  . Highest education level: Not on file  Occupational History  . Not on file  Tobacco Use  . Smoking status: Never Smoker  . Smokeless tobacco: Never Used  Vaping Use  . Vaping Use: Never used  Substance and Sexual Activity  . Alcohol use: No  . Drug use: No  . Sexual activity: Yes  Other Topics Concern  . Not on file  Social History Narrative   ** Merged History Encounter **       Social Determinants of Health   Financial Resource Strain:   . Difficulty of Paying Living Expenses: Not on file  Food Insecurity:   . Worried About Charity fundraiser in the Last Year: Not on file  . Ran Out of Food in the  Last Year: Not on file  Transportation Needs:   . Lack of Transportation (Medical): Not on file  . Lack of Transportation (Non-Medical): Not on file  Physical Activity:   . Days of Exercise per Week: Not on file  . Minutes of Exercise per Session: Not on file  Stress:   . Feeling of Stress : Not on file  Social Connections:   . Frequency of Communication with Friends and Family: Not on file  . Frequency of Social Gatherings with Friends and Family: Not on file  . Attends Religious Services: Not on file  . Active Member of Clubs or Organizations: Not on file  . Attends Archivist Meetings: Not on file  . Marital Status: Not on file  Intimate Partner Violence:   . Fear of Current or Ex-Partner: Not on file  . Emotionally Abused: Not on file  . Physically Abused: Not on file  . Sexually Abused: Not on file    Allergies  Allergen Reactions  . Ace Inhibitors Swelling    Swelling of the tongue  . Influenza Vaccines Hives and Swelling    SWELLING REACTION UNSPECIFIED   . Ketorolac Swelling    SWELLING REACTION UNSPECIFIED   . Lidocaine Swelling    TONGUE SWELLS  . Lisinopril Swelling    TONGUE SWELLING Pt reported problem with a BP med which sounded like  lisinopril  But as of 09/19/06,pt had tolerated altace without problem  . Penicillins Swelling    TONGUE SWELLS Has patient had a PCN reaction causing immediate rash, facial/tongue/throat swelling, SOB or lightheadedness with hypotension: Yes Has patient had a PCN reaction causing severe rash involving mucus membranes or skin necrosis: No Has patient had a PCN reaction that required hospitalization No Has patient had a PCN reaction occurring within the last 10 years: Yes If all of the above answers are "NO", then may proceed with Cephalosporin use.     Family History  Problem Relation Age of Onset  . Hypertension Mother   . Liver disease Father     Prior to Admission medications   Medication Sig Start Date  End Date Taking? Authorizing Provider  albuterol (PROVENTIL) (2.5 MG/3ML) 0.083% nebulizer solution Take 3 mLs (2.5 mg total) by nebulization every 6 (six) hours as needed for wheezing or shortness of breath. 10/01/19   [name], MD  albuterol (VENTOLIN HFA) 108 (90 Base) MCG/ACT inhaler Inhale 2 puffs into the lungs every 4 (four) hours as needed for wheezing or shortness of breath. 10/01/19   [name], MD  amLODipine (NORVASC) 10 MG tablet Take 1 tablet (10 mg total) by mouth daily. 03/24/20   [name], MD  aspirin EC 81 MG tablet Take 1 tablet (81 mg total) by mouth daily. 01/22/16   [name], [name], MD  atorvastatin (LIPITOR) 40 MG tablet TAKE 1 TABLET (40 MG TOTAL) BY MOUTH AT BEDTIME. 07/30/20   [name], MD  budesonide-formoterol [facility]) 80-4.5 MCG/ACT inhaler Inhale 2 puffs into the lungs 2 (two) times daily. 06/04/20   [name], MD  calcitRIOL (ROCALTROL) 0.5 MCG capsule Take 0.5 mcg by mouth daily.  08/16/18   [provider]  carvedilol (COREG) 25 MG tablet Take 1 tablet (25 mg total) by mouth 2 (two) times daily with a meal. 10/01/19   [name], MD  Cholecalciferol (VITAMIN D-3) 125 MCG (5000 UT) TABS Take 5,000 Units by mouth daily.     [provider]  EPINEPHrine 0.3 mg/0.3 mL IJ SOAJ injection Inject 0.3 mLs (0.3 mg total) into the muscle once as needed for anaphylaxis. 03/24/20   [name], MD  fluticasone (FLONASE) 50 MCG/ACT nasal spray Place 1 spray into both nostrils daily. 12/31/18   Volanda Napoleon, PA-C  hydrALAZINE (APRESOLINE) 25 MG tablet Take 1 tablet (25 mg total) by mouth 3 (three) times daily. 06/15/20 08/19/20  [name], MD  isosorbide mononitrate (IMDUR) 30 MG 24 hr tablet Take 1 tablet (30 mg total) by mouth daily. 10/01/19   [name], MD  loratadine (CLARITIN) 10 MG tablet Take 10 mg by mouth daily as needed for allergies.    [provider]  torsemide  (DEMADEX) 20 MG tablet Take 2 tablets (40 mg total) by mouth 2 (two) times daily for 5 days. May take the medication 40 mg p.o. twice daily x5 days then daily, per instruction by cardiology and nephrologist. 06/15/20 08/19/20  [name], MD    Physical Exam: Vitals:   09/22/20 0615 09/22/20 0630 09/22/20 0645 09/22/20 0700  BP: (!) 161/70 (!) 141/77 (!) 142/81 133/84  Pulse: 82 86 84 84  Resp: (!) 21 (!) 21 20 (!) 23  SpO2: 97% 95% 97% 95%  Weight:      Height:         . General:  Appears calm and comfortable and is NAD . Eyes:  EOMI, normal lids, iris . ENT:  grossly normal hearing, lip edema, large tongue that occupies most of anterior mouth but airway appears to be open . Neck:  no LAD, masses or thyromegaly . Cardiovascular:  RRR, no m/r/g. No LE edema.  Marland Kitchen Respiratory:   CTA bilaterally with no wheezes/rales/rhonchi.  Normal respiratory effort. . Abdomen:  soft, NT, ND, NABS . Skin:  no rash or induration seen on limited exam . Musculoskeletal:  grossly normal tone BUE/BLE, good ROM, no bony abnormality . Psychiatric:  grossly normal mood and affect, speech fluent and appropriate, AOx3 . Neurologic:  CN 2-12 grossly intact, moves all extremities in coordinated fashion    Radiological Exams on Admission: No results found.  EKG: Independently reviewed.  NSR with rate 75; IVCD, LVH; NSCSLT  Labs on Admission: I have personally reviewed the available labs and imaging studies at the time of the admission.  Pertinent labs:   CO2 18 Glucose 17 BUN 72/Creatinine 9.36/GFR 6 WBC 10.1 Hgb 12.3 COVID/flu negative   Assessment/Plan Principal Problem:   Angioedema Active Problems:   Hyperlipidemia   Class 3 severe obesity due to excess calories with serious comorbidity and body mass index (BMI) of 40.0 to 44.9 in adult [facility])   Essential hypertension   Asthma  Obstructive sleep apnea   Chronic combined systolic and diastolic CHF (congestive heart failure) (HCC)    CKD (chronic kidney disease) stage 5, GFR less than 15 ml/min (HCC)   CAD (coronary artery disease), native coronary artery    Angioedema -Patient with intermittent food-related angioedema -He does not appear to have a new rash -He does demonstrate marked tongue swelling and lip edema without obvious airway compromise at this time -He was treated in the ER with Benadryl; Pepcid  -These medications have been continued as standing medications and steroids have been added -Since he appears to be mildly improved and not worsening at this time, will hold further epinephrine - but will order as prn -Will observe overnight with continuous pulse ox.   -If his tongue edema is improving, he is likely appropriate for d/c tomorrow. -Suggest outpatient allergy f/u -He definitely needs to keep an Epipen on hand  HTN -Continue Norvasc, hydralazine, Coreg  HLD -Continue Lipitor  Stage 5 CKD -Patient is nearing need for HD -No known correlation between uremia and angioedema -He does have mild acidosis but does not appear to have other imminent need for HD today -Patient briefly d/w Dr. Jonnie Finner, who agrees -Has outpatient f/u scheduled with Dr. Joelyn Oms in a few weeks -Continue Calcitriol  CAD/Chronic combined CHF with AICD -Echo on 06/15/20 with EF 30-35% and grade 2 diastolic dysfunction -Appears to be compensated at this time -Due for AICD replacement soon (10/13/20) -Continue ASA, Imdur -Continue Demadex  OSA -Continue CPAP  Asthma -Continue Albtuterol nebs prn -Continue Symbicort (Breo formulary substitution)  Obesity Body mass index is 42.06 kg/m. -Weight loss should be encouraged -Outpatient PCP/bariatric medicine/bariatric surgery f/u encouraged     Note: This patient has been tested and is negative for the novel coronavirus COVID-19. The patient has been fully vaccinated against COVID-19.    DVT prophylaxis: Heparin Code Status:  Full - confirmed with patient Family  Communication: None present Disposition Plan:  The patient is from: home  Anticipated d/c is to: home without [facility] services   Anticipated d/c date will depend on clinical response to treatment, but possibly as early as tomorrow if he has excellent response to treatment  Patient is currently: acutely ill Consults called: Nephrology - telephone only Admission status:  It is my clinical opinion that referral for OBSERVATION is reasonable and necessary in this patient based on the above information provided. The aforementioned taken together are felt to place the patient at high risk for further clinical deterioration. However it is anticipated that the patient may be medically stable for discharge from the hospital within 24 to 48 hours.    Karmen Bongo MD [facility]   How to contact the [facility] Attending or Consulting provider [place] or covering provider during after hours [place], for this patient?  1. Check the care team in [facility] and look for a) attending/consulting [facility] provider listed and b) the [facility] team listed 2. Log into [email or web address] and use [place]'s universal password to access. If you do not have the password, please contact the hospital operator. 3. Locate the [facility] provider you are looking for under [facility] and page to a number that you can be directly reached. 4. If you still have difficulty reaching the provider, please page the [facility] (Director on Call) for the Hospitalists listed on amion for assistance.   09/22/2020, 8:48 AM

## 2020-09-22 NOTE — ED Notes (Signed)
Gave the patient some water

## 2020-09-22 NOTE — ED Notes (Signed)
Lunch Tray Ordered @ 1050. 

## 2020-09-22 NOTE — ED Provider Notes (Signed)
Care assumed from Dr. Tyrone Nine.  Patient with angioedema of the tongue.  He has been observed in the ED for 5 hours.  There has been minimal improvement.  He is able to speak, but tongue still occludes the entire oral cavity.  He is not felt to be safe for discharge.  Will check screening labs and arrange for hospital admission.  4:35 AM Phlebotomy inability to draw blood, therefore I drew his labs from the right femoral vein.  There has been some additional improvement in his tongue swelling in the interim.  5:59 AM Labs are unremarkable, urine and creatinine elevated consistent with end-stage renal disease, mild anemia present, negative respiratory pathogen's.  Case is discussed with Dr. Tonie Griffith of [facility], who agrees to admit the patient.   Delora Fuel, MD 37/35/78 [id number]

## 2020-09-22 NOTE — ED Notes (Signed)
Pt able to speak 3 to 4 words now without difficulty. Patient vss and nad. Blood work attmpted 2 by phlebotomy and nurse x2.

## 2020-09-23 ENCOUNTER — Other Ambulatory Visit (HOSPITAL_COMMUNITY): Payer: Self-pay | Admitting: Internal Medicine

## 2020-09-23 DIAGNOSIS — T783XXA Angioneurotic edema, initial encounter: Secondary | ICD-10-CM | POA: Diagnosis not present

## 2020-09-23 LAB — CBC
HCT: 36.4 % — ABNORMAL LOW (ref 39.0–52.0)
Hemoglobin: 11.7 g/dL — ABNORMAL LOW (ref 13.0–17.0)
MCH: 29.7 pg (ref 26.0–34.0)
MCHC: 32.1 g/dL (ref 30.0–36.0)
MCV: 92.4 fL (ref 80.0–100.0)
Platelets: 208 10*3/uL (ref 150–400)
RBC: 3.94 MIL/uL — ABNORMAL LOW (ref 4.22–5.81)
RDW: 16.4 % — ABNORMAL HIGH (ref 11.5–15.5)
WBC: 14 10*3/uL — ABNORMAL HIGH (ref 4.0–10.5)
nRBC: 0 % (ref 0.0–0.2)

## 2020-09-23 LAB — BASIC METABOLIC PANEL
Anion gap: 16 — ABNORMAL HIGH (ref 5–15)
BUN: 95 mg/dL — ABNORMAL HIGH (ref 6–20)
CO2: 18 mmol/L — ABNORMAL LOW (ref 22–32)
Calcium: 9.3 mg/dL (ref 8.9–10.3)
Chloride: 106 mmol/L (ref 98–111)
Creatinine, Ser: 10.43 mg/dL — ABNORMAL HIGH (ref 0.61–1.24)
GFR, Estimated: 5 mL/min — ABNORMAL LOW (ref >60)
Glucose, Bld: 181 mg/dL — ABNORMAL HIGH (ref 70–99)
Potassium: 4.2 mmol/L (ref 3.5–5.1)
Sodium: 140 mmol/L (ref 135–145)

## 2020-09-23 MED ORDER — CETIRIZINE HCL 10 MG PO TABS: 10.0000 mg | ORAL_TABLET | Freq: Every day | ORAL | 0 refills | Status: DC

## 2020-09-23 MED ORDER — EPINEPHRINE 0.3 MG/0.3ML IJ SOAJ: 0.3000 mg | Freq: Once | INTRAMUSCULAR | 0 refills | Status: DC | PRN

## 2020-09-23 MED FILL — CETIRIZINE HCL 10 MG TABS: 10 | 30 days supply | Qty: 30 | Fill #0

## 2020-09-23 NOTE — Discharge Summary (Signed)
Physician Discharge Summary  Victor Thompson ZOX:[id number] DOB: September 11, 1961 DOA: 09/21/2020  PCP: [name], MD  Admit date: 09/21/2020 Discharge date: 09/23/2020   Discharge Diagnoses:  Principal Problem:   Angioedema Active Problems:   Hyperlipidemia   Class 3 severe obesity due to excess calories with serious comorbidity and body mass index (BMI) of 40.0 to 44.9 in adult [facility])   Essential hypertension   Asthma   Obstructive sleep apnea   Chronic combined systolic and diastolic CHF (congestive heart failure) (HCC)   CKD (chronic kidney disease) stage 5, GFR less than 15 ml/min (HCC)   CAD (coronary artery disease), native coronary artery  Angioedema -Patient with intermittent food-related angioedema -He does not appear to have a new rash -He does demonstrate marked tongue swelling and lip edema without obvious airway compromise at this time -He was treated in the ER with Benadryl; Pepcid  -These medications have been continued as standing medications and steroids have been added -Since he appears to be mildly improved and not worsening at this time, will hold further epinephrine - but will order as prn -Will observe overnight with continuous pulse ox.   -If his tongue edema is improving, he is likely appropriate for d/c tomorrow. -Suggest outpatient allergy f/u -He definitely needs to keep an Epipen on hand -pt discharged on zyrtec at bedtime and epipen. outpatient allergist referral within 1- 2 weeks.    HTN -Continue Norvasc, hydralazine, Coreg  HLD -Continue Lipitor  Stage 5 CKD -Patient is nearing need for HD. -No known correlation between uremia and angioedema -He does have mild acidosis but does not appear to have other imminent need for HD today -Patient briefly d/w Dr. Jonnie Finner, who agrees -Has outpatient f/u scheduled with Dr. Joelyn Oms in a few weeks -Continue Calcitriol. -continue close nephrology follow up on outpatient basis.  CAD/Chronic  combined CHF with AICD -Echo on 06/15/20 with EF 30-35% and grade 2 diastolic dysfunction -Appears to be compensated at this time -Due for AICD replacement soon (10/13/20) -Continue ASA, Imdur -Continue Demadex  OSA -Continue CPAP, d/w pt about regular use of cpap.  Asthma -Continue Albtuterol nebs prn -Continue Symbicort ([employer] formulary substitution)  Obesity -Body mass index is 42.06 kg/m. -Weight loss should be encouraged -Outpatient PCP/bariatric medicine/bariatric surgery f/u encouraged   Discharge Condition:  Good  Diet recommendation:  Low fat, low carb,low sodium diet.    Filed Weights   09/22/20 0537  Weight: 121.8 kg    Discharge activity: As tolerated.  History of present illness:  Victor Thompson is a 59 y.o. male with medical history significant of OSA on CPAP; CAD; stage 5 CKD; HTN; and AICD placement presenting with tongue swelling.  He reports episodic tongue swelling which started when he was in prison years ago.  He has had maybe 15 lifetime episodes, most recently last May.  Generally, his hands start to tingle, then his feet tingle, and then his tongue swells.  It usually resolves after he takes Benadryl, an Epipen, and prednisone.  He has seen a specialist but they did not find a reason for the symptoms.  Last night, he went to eat at [employer] with his girlfriend.  He developed symptoms about 915 and tried his usual measures; since he didn't have relief he drove in.  On the drive, his symptoms got significantly worse and he felt like he was having difficulty keeping his tongue in his mouth.  Upon arrival, he was given Benadryl and Pepcid x 1, about 1015PM.  He has  not received additional treatment and still "feels funny."    He has stage 5 CKD and has a fistula but has not yet started HD.  He saw Dr. Joelyn Oms 3 weeks ago and is due to see him again in 3 more weeks.  He is also due to have his AICD replaced soon.  ED Course:  Carryover, per Dr.  Tonie Griffith Patient presented for evaluation of swelling of his tongue. It after eating at [facility] last night. No respiratory symptoms. Has been given Pepcid and Benadryl as well as steroids in the emergency room. Was also given epinephrine. Has been in the emergency room for 8 hours. One called about placing patient in observation ER doctor reported patient standing is improved and not as swollen and patient is now speaking in sentences and has no respiratory complaints but feels he should be observed and could possibly go home this afternoon.  Hospital Course:  Pt admitted for angioedema with no obvious etiology, pt needs allergy and immunology evaluation in the coming two weeks. Pt is alert,awake and states he feels as he is back to baseline. He Denies any difficulty speaking breathing or eating, pt also denies any complaints of abd pain or hematuria.  Hospital stay has been uneventful otherwise.  Procedures: None. Consultations: None.  Discharge Exam: Vitals:   09/23/20 1000 09/23/20 1200  BP: 122/81 108/69  Pulse: 90 85  Resp:    Temp:    SpO2: 97% 93%   Physical Exam Vitals and nursing note reviewed.  Constitutional:      Appearance: He is obese.  HENT:     Head: Normocephalic and atraumatic.     Right Ear: External ear normal.     Left Ear: External ear normal.     Nose: Nose normal.     Mouth/Throat:     Mouth: Mucous membranes are dry. No angioedema.     Tongue: No lesions. Tongue does not deviate from midline.     Palate: No mass and lesions.     Pharynx: Oropharynx is clear. No pharyngeal swelling or posterior oropharyngeal erythema.  Eyes:     Extraocular Movements: Extraocular movements intact.  Cardiovascular:     Rate and Rhythm: Normal rate and regular rhythm.     Pulses: Normal pulses.     Heart sounds: Normal heart sounds.  Pulmonary:     Effort: Pulmonary effort is normal. No respiratory distress.     Breath sounds: Normal breath sounds.  Abdominal:      Palpations: Abdomen is soft.  Neurological:     Mental Status: He is alert.    Discharge Instructions Please follow up with allergist in 1-2 weeks and return to ER if swelling of face, lips mouth occur or any SOB or chest pain.   Discharge Instructions    Call MD for:   Complete by: As directed    LIP SWELLING/ TONGUE SWELLING/ DIFFICULTY BREATHING/ SOB/ CHEST PAIN/ RASH OR TROUBLE SWALLOWING.   Call MD for:  extreme fatigue   Complete by: As directed    Call MD for:  hives   Complete by: As directed    Call MD for:  persistant dizziness or light-headedness   Complete by: As directed    Call MD for:  persistant nausea and vomiting   Complete by: As directed    Call MD for:  severe uncontrolled pain   Complete by: As directed    Diet - low sodium heart healthy   Complete by: As directed  Diet Carb Modified   Complete by: As directed    Increase activity slowly   Complete by: As directed      Allergies as of 09/23/2020      Reactions   Ace Inhibitors Swelling   Swelling of the tongue   Influenza Vaccines Hives, Swelling   SWELLING REACTION UNSPECIFIED    Ketorolac Swelling   SWELLING REACTION UNSPECIFIED    Lidocaine Swelling   TONGUE SWELLS   Lisinopril Swelling   TONGUE SWELLING Pt reported problem with a BP med which sounded like  lisinopril  But as of 09/19/06,pt had tolerated altace without problem   Penicillins Swelling   TONGUE SWELLS Has patient had a PCN reaction causing immediate rash, facial/tongue/throat swelling, SOB or lightheadedness with hypotension: Yes Has patient had a PCN reaction causing severe rash involving mucus membranes or skin necrosis: No Has patient had a PCN reaction that required hospitalization No Has patient had a PCN reaction occurring within the last 10 years: Yes If all of the above answers are "NO", then may proceed with Cephalosporin use.    Med Rec must be completed prior to using this [place]      Allergies   Allergen Reactions  . Ace Inhibitors Swelling    Swelling of the tongue  . Influenza Vaccines Hives and Swelling    SWELLING REACTION UNSPECIFIED   . Ketorolac Swelling    SWELLING REACTION UNSPECIFIED   . Lidocaine Swelling    TONGUE SWELLS  . Lisinopril Swelling    TONGUE SWELLING Pt reported problem with a BP med which sounded like  lisinopril  But as of 09/19/06,pt had tolerated altace without problem  . Penicillins Swelling    TONGUE SWELLS Has patient had a PCN reaction causing immediate rash, facial/tongue/throat swelling, SOB or lightheadedness with hypotension: Yes Has patient had a PCN reaction causing severe rash involving mucus membranes or skin necrosis: No Has patient had a PCN reaction that required hospitalization No Has patient had a PCN reaction occurring within the last 10 years: Yes If all of the above answers are "NO", then may proceed with Cephalosporin use.     Follow-up Information    [name], MD. Call in 1 week(s).   Specialty: Internal Medicine Contact information: [place] [address] [phone]        [facility]. Call in 1 week(s).   Contact information: [place] [phone]             The results of significant diagnostics from this hospitalization (including imaging, microbiology, ancillary and laboratory) are listed below for reference.    Significant Diagnostic Studies: No results found.  Microbiology: Recent Results (from the past 240 hour(s))  Respiratory Panel by RT PCR (Flu A&B, Covid) - Nasopharyngeal Swab     Status: None   Collection Time: 09/22/20  3:37 AM   Specimen: Nasopharyngeal Swab  Result Value Ref Range Status   SARS Coronavirus 2 by RT PCR NEGATIVE NEGATIVE Final    Comment: (NOTE) SARS-CoV-2 target nucleic acids are NOT DETECTED.  The SARS-CoV-2 RNA is generally detectable in upper respiratoy specimens during the  acute phase of infection. The lowest concentration of SARS-CoV-2 viral copies this assay can detect is 131 copies/mL. A negative result does not preclude SARS-Cov-2 infection and should not be used as the sole basis for treatment or other patient management decisions. A negative result may occur with  improper  specimen collection/handling, submission of specimen other than nasopharyngeal swab, presence of viral mutation(s) within the areas targeted by this assay, and inadequate number of viral copies (<131 copies/mL). A negative result must be combined with clinical observations, patient history, and epidemiological information. The expected result is Negative.  Fact Sheet for Patients:  PinkCheek.be  Fact Sheet for Healthcare Providers:  GravelBags.it  This test is no t yet approved or cleared by the [place] FDA and  has been authorized for detection and/or diagnosis of SARS-CoV-2 by FDA under an Emergency Use Authorization (EUA). This EUA will remain  in effect (meaning this test can be used) for the duration of the COVID-19 declaration under Section 564(b)(1) of the Act, 21 U.S.C. section 360bbb-3(b)(1), unless the authorization is terminated or revoked sooner.     Influenza A by PCR NEGATIVE NEGATIVE Final   Influenza B by PCR NEGATIVE NEGATIVE Final    Comment: (NOTE) The Xpert Xpress SARS-CoV-2/FLU/RSV assay is intended as an aid in  the diagnosis of influenza from Nasopharyngeal swab specimens and  should not be used as a sole basis for treatment. Nasal washings and  aspirates are unacceptable for Xpert Xpress SARS-CoV-2/FLU/RSV  testing.  Fact Sheet for Patients: PinkCheek.be  Fact Sheet for Healthcare Providers: GravelBags.it  This test is not yet approved or cleared by the [place] FDA and  has been authorized for detection and/or diagnosis  of SARS-CoV-2 by  FDA under an Emergency Use Authorization (EUA). This EUA will remain  in effect (meaning this test can be used) for the duration of the  Covid-19 declaration under Section 564(b)(1) of the Act, 21  U.S.C. section 360bbb-3(b)(1), unless the authorization is  terminated or revoked. Performed at [facility], [place] [address] [place], [place] [address]      Labs: Basic Metabolic Panel: Recent Labs  Lab 09/22/20 0440 09/23/20 0523  NA 140 140  K 4.0 4.2  CL 107 106  CO2 18* 18*  GLUCOSE 176* 181*  BUN 72* 95*  CREATININE 9.36* 10.43*  CALCIUM 9.1 9.3   Liver Function Tests: No results for input(s): AST, ALT, ALKPHOS, BILITOT, PROT, ALBUMIN in the last 168 hours. No results for input(s): LIPASE, AMYLASE in the last 168 hours. No results for input(s): AMMONIA in the last 168 hours. CBC: Recent Labs  Lab 09/22/20 0440 09/23/20 0523  WBC 10.1 14.0*  NEUTROABS 9.5*  --   HGB 12.3* 11.7*  HCT 38.0* 36.4*  MCV 91.3 92.4  PLT 235 208   Cardiac Enzymes: No results for input(s): CKTOTAL, CKMB, CKMBINDEX, TROPONINI in the last 168 hours. BNP: BNP (last 3 results) Recent Labs    11/26/19 1007 06/14/20 1818  BNP 680.6* 931.6*    ProBNP (last 3 results) No results for input(s): PROBNP in the last 8760 hours.  CBG: No results for input(s): GLUCAP in the last 168 hours.    Time spent: 20 minutes  Signed: Para Skeans MD.  [facility] 09/23/2020, 1:24 PM

## 2020-10-02 ENCOUNTER — Other Ambulatory Visit: Payer: Self-pay

## 2020-10-02 ENCOUNTER — Ambulatory Visit: Payer: 59 | Attending: Internal Medicine | Admitting: Internal Medicine

## 2020-10-02 ENCOUNTER — Encounter: Payer: Self-pay | Admitting: Internal Medicine

## 2020-10-02 VITALS — BP 126/78 | HR 77 | Resp 16 | Wt 266.0 lb

## 2020-10-02 DIAGNOSIS — Z09 Encounter for follow-up examination after completed treatment for conditions other than malignant neoplasm: Secondary | ICD-10-CM | POA: Diagnosis not present

## 2020-10-02 DIAGNOSIS — I10 Essential (primary) hypertension: Secondary | ICD-10-CM | POA: Diagnosis not present

## 2020-10-02 DIAGNOSIS — J449 Chronic obstructive pulmonary disease, unspecified: Secondary | ICD-10-CM | POA: Diagnosis not present

## 2020-10-02 DIAGNOSIS — N185 Chronic kidney disease, stage 5: Secondary | ICD-10-CM

## 2020-10-02 DIAGNOSIS — I2510 Atherosclerotic heart disease of native coronary artery without angina pectoris: Secondary | ICD-10-CM | POA: Diagnosis not present

## 2020-10-02 NOTE — Progress Notes (Signed)
Patient ID: Khyler Eschmann, male    DOB: 11/01/1961  MRN: [id number]  CC: Hospitalization Follow-up   Subjective: Victor Thompson is a 59 y.o. male who presents for hosp f/u His concerns today include:  Pt with hx of CKD stage5(Dr. Joelyn Oms), HTN, CAD (with hx of cardiac arrest and), ICD, combined sys/dia CHF(EF 30-35 % 03/2020), HL, Obese, COPD,chronic lower back pain with multilevel spondylosis.  Patient hospitalized 10/17-19/2021 with angioedema.  Patient states he developed swelling of the lips and tongue a few hours after eating at a restaurant.  No prior history of angioedema.  He has no prior food allergies.  He is not on ACE inhibitor or any new medications.  He was treated with Benadryl, Pepcid and steroids.  He was eventually able to be discharged home.  He was given an EpiPen.  HYPERTENSION/CAD/CHF Currently taking: see medication list Med Adherence: [x]  Yes    []  No Medication side effects: []  Yes    [x]  No Adherence with salt restriction: [x]  Yes    []  No Home Monitoring?: []  Yes    [x]  No Monitoring Frequency: []  Yes    []  No Home BP results range: []  Yes    []  No SOB? []  Yes    [x]  No Chest Pain?: []  Yes    [x]  No Leg swelling?: []  Yes    [x]  No Headaches?: []  Yes    [x]  No Dizziness? []  Yes    [x]  No Comments:    COPD: Reports compliance with Symbicort.  He feels his breathing has been good recently.  No increased cough.  No wheezing.  He declines flu vaccine.  CKD stage V: Saw Dr. Joelyn Oms last week.  States he was told that he is weeks away from needing HD.  He dreads the thought of it.  Legal aid is working with him to try get him approved for disability.  All paperwork has been turned in.  He has discontinued working.  Patient Active Problem List   Diagnosis Date Noted  . Angioedema 09/22/2020  . Acute on chronic combined systolic and diastolic CHF (congestive heart failure) ([place]) 06/14/2020  . Secondary hyperparathyroidism of renal origin ([place]) 07/15/2019   . Vitamin D deficiency 07/15/2019  . CAD (coronary artery disease), native coronary artery 04/10/2019  . CKD (chronic kidney disease) stage 5, GFR less than 15 ml/min (H[facility]) 09/26/2018  . Chronic midline low back pain without sciatica 02/17/2018  . VT (ventricular tachycardia) ([place]) 03/29/2016  . Defibrillator discharge   . Chronic combined systolic and diastolic CHF (congestive heart failure) ([place]) 01/22/2016  . Erectile dysfunction 01/22/2016  . Allergic rhinitis 01/22/2016  . GERD (gastroesophageal reflux disease) 01/22/2016  . PROTEINURIA 02/17/2010  . Cardiomyopathy, ischemic 02/16/2010  . Obstructive sleep apnea 02/16/2010  . EXTERNAL HEMORRHOIDS 09/26/2009  . Tobacco use disorder in remission 06/03/2009  . Essential hypertension 10/01/2008  . Hyperlipidemia 07/14/2007  . Class 3 severe obesity due to excess calories with serious comorbidity and body mass index (BMI) of 40.0 to 44.9 in adult ([place]) 07/14/2007  . Asthma 07/14/2007     Current Outpatient Medications on File Prior to Visit  Medication Sig Dispense Refill  . albuterol (PROVENTIL) (2.5 MG/3ML) 0.083% nebulizer solution Take 3 mLs (2.5 mg total) by nebulization every 6 (six) hours as needed for wheezing or shortness of breath. 75 mL 12  . albuterol (VENTOLIN HFA) 108 (90 Base) MCG/ACT inhaler Inhale 2 puffs into the lungs every 4 (four) hours as needed for wheezing or shortness  of breath. 8 g 6  . amLODipine (NORVASC) 10 MG tablet Take 1 tablet (10 mg total) by mouth daily. 90 tablet 1  . aspirin EC 81 MG tablet Take 1 tablet (81 mg total) by mouth daily. 30 tablet 5  . atorvastatin (LIPITOR) 40 MG tablet TAKE 1 TABLET (40 MG TOTAL) BY MOUTH AT BEDTIME. 30 tablet 1  . budesonide-formoterol (SYMBICORT) 80-4.5 MCG/ACT inhaler Inhale 2 puffs into the lungs 2 (two) times daily. 1 Inhaler 6  . calcitRIOL (ROCALTROL) 0.5 MCG capsule Take 0.5 mcg by mouth daily.   11  . carvedilol (COREG) 25 MG tablet Take 1 tablet (25 mg  total) by mouth 2 (two) times daily with a meal. 180 tablet 1  . cetirizine (ZYRTEC ALLERGY) 10 MG tablet Take 1 tablet (10 mg total) by mouth at bedtime. 30 tablet 0  . Cholecalciferol (VITAMIN D-3) 125 MCG (5000 UT) TABS Take 5,000 Units by mouth daily.     Marland Kitchen EPINEPHrine 0.3 mg/0.3 mL IJ SOAJ injection Inject 0.3 mLs (0.3 mg total) into the muscle once. 1 Device 0  . EPINEPHrine 0.3 mg/0.3 mL IJ SOAJ injection Inject 0.3 mLs (0.3 mg total) into the muscle once. 1 Device 2  . EPINEPHrine 0.3 mg/0.3 mL IJ SOAJ injection Inject 0.3 mLs (0.3 mg total) into the muscle once as needed for anaphylaxis. 1 each 1  . EPINEPHrine 0.3 mg/0.3 mL IJ SOAJ injection Inject 0.3 mg into the muscle once as needed for up to 1 dose (if worsening tongue swelling, SOB, hypoxia, or other concerns for progressive anaphylaxis). 1 each 0  . fluticasone (FLONASE) 50 MCG/ACT nasal spray Place 1 spray into both nostrils daily. (Patient not taking: Reported on 09/22/2020) 16 g 2  . hydrALAZINE (APRESOLINE) 10 MG tablet Take 20 mg by mouth 2 (two) times daily.    . hydrALAZINE (APRESOLINE) 25 MG tablet Take 1 tablet (25 mg total) by mouth 3 (three) times daily. (Patient not taking: Reported on 09/22/2020) 90 tablet 1  . isosorbide mononitrate (IMDUR) 30 MG 24 hr tablet Take 1 tablet (30 mg total) by mouth daily. 90 tablet 1  . torsemide (DEMADEX) 20 MG tablet Take 2 tablets (40 mg total) by mouth 2 (two) times daily for 5 days. May take the medication 40 mg p.o. twice daily x5 days then daily, per instruction by cardiology and nephrologist. (Patient taking differently: Take 40 mg by mouth daily. ) 20 tablet 0   No current facility-administered medications on file prior to visit.    Allergies  Allergen Reactions  . Ace Inhibitors Swelling    Swelling of the tongue  . Influenza Vaccines Hives and Swelling    SWELLING REACTION UNSPECIFIED   . Ketorolac Swelling    SWELLING REACTION UNSPECIFIED   . Lidocaine Swelling     TONGUE SWELLS  . Lisinopril Swelling    TONGUE SWELLING Pt reported problem with a BP med which sounded like  lisinopril  But as of 09/19/06,pt had tolerated altace without problem  . Penicillins Swelling    TONGUE SWELLS Has patient had a PCN reaction causing immediate rash, facial/tongue/throat swelling, SOB or lightheadedness with hypotension: Yes Has patient had a PCN reaction causing severe rash involving mucus membranes or skin necrosis: No Has patient had a PCN reaction that required hospitalization No Has patient had a PCN reaction occurring within the last 10 years: Yes If all of the above answers are "NO", then may proceed with Cephalosporin use.     Social History  Socioeconomic History  . Marital status: Single    Spouse name: Not on file  . Number of children: Not on file  . Years of education: Not on file  . Highest education level: Not on file  Occupational History  . Not on file  Tobacco Use  . Smoking status: Never Smoker  . Smokeless tobacco: Never Used  Vaping Use  . Vaping Use: Never used  Substance and Sexual Activity  . Alcohol use: No  . Drug use: No  . Sexual activity: Yes  Other Topics Concern  . Not on file  Social History Narrative   ** Merged History Encounter **       Social Determinants of Health   Financial Resource Strain:   . Difficulty of Paying Living Expenses: Not on file  Food Insecurity:   . Worried About Charity fundraiser in the Last Year: Not on file  . Ran Out of Food in the Last Year: Not on file  Transportation Needs:   . Lack of Transportation (Medical): Not on file  . Lack of Transportation (Non-Medical): Not on file  Physical Activity:   . Days of Exercise per Week: Not on file  . Minutes of Exercise per Session: Not on file  Stress:   . Feeling of Stress : Not on file  Social Connections:   . Frequency of Communication with Friends and Family: Not on file  . Frequency of Social Gatherings with Friends and  Family: Not on file  . Attends Religious Services: Not on file  . Active Member of Clubs or Organizations: Not on file  . Attends Archivist Meetings: Not on file  . Marital Status: Not on file  Intimate Partner Violence:   . Fear of Current or Ex-Partner: Not on file  . Emotionally Abused: Not on file  . Physically Abused: Not on file  . Sexually Abused: Not on file    Family History  Problem Relation Age of Onset  . Hypertension Mother   . Liver disease Father     Past Surgical History:  Procedure Laterality Date  . AV FISTULA PLACEMENT Right 03/15/2019   Procedure: RIGHT ARM ARTERIOVENOUS (AV) FISTULA CREATION;  Surgeon: Angelia Mould, MD;  Location: [facility];  Service: Vascular;  Laterality: Right;  . COLONOSCOPY    . COLONOSCOPY W/ BIOPSIES AND POLYPECTOMY    . CORONARY STENT PLACEMENT  2007  . IMPLANTABLE CARDIOVERTER DEFIBRILLATOR IMPLANT  2015    ROS: Review of Systems Negative except as stated above  PHYSICAL EXAM: BP 126/78   Pulse 77   Resp 16   Wt 266 lb (120.7 kg)   SpO2 95%   BMI 41.66 kg/m   Physical Exam   General appearance - alert, well appearing, and in no distress Mental status - normal mood, behavior, speech, dress, motor activity, and thought processes Neck - supple, no significant adenopathy Chest - clear to auscultation, no wheezes, rales or rhonchi, symmetric air entry Heart - normal rate, regular rhythm, normal S1, S2, no murmurs, rubs, clicks or gallops Extremities -no lower extremity edema.  He has  a dialysis graft in the right upper arm. CMP Latest Ref Rng & Units 09/23/2020 09/22/2020 06/30/2020  Glucose 70 - 99 mg/dL 181(H) 176(H) 100(H)  BUN 6 - 20 mg/dL 95(H) 72(H) 56(H)  Creatinine 0.61 - 1.24 mg/dL 10.43(H) 9.36(H) 9.56(H)  Sodium 135 - 145 mmol/L 140 140 145(H)  Potassium 3.5 - 5.1 mmol/L 4.2 4.0 4.1  Chloride 98 -  111 mmol/L 106 107 103  CO2 22 - 32 mmol/L 18(L) 18(L) 21  Calcium 8.9 - 10.3 mg/dL 9.3 9.1 9.5    Total Protein 6.5 - 8.1 g/dL - - -  Total Bilirubin 0.3 - 1.2 mg/dL - - -  Alkaline Phos 38 - 126 U/L - - -  AST 15 - 41 U/L - - -  ALT 0 - 44 U/L - - -   Lipid Panel     Component Value Date/Time   CHOL 182 03/05/2016 0503   TRIG 118 03/05/2016 0503   HDL 38 (L) 03/05/2016 0503   CHOLHDL 4.8 03/05/2016 0503   VLDL 24 03/05/2016 0503   LDLCALC 120 (H) 03/05/2016 0503    CBC    Component Value Date/Time   WBC 14.0 (H) 09/23/2020 0523   RBC 3.94 (L) 09/23/2020 0523   HGB 11.7 (L) 09/23/2020 0523   HGB 11.9 (L) 07/27/2018 1006   HCT 36.4 (L) 09/23/2020 0523   HCT 35.1 (L) 07/27/2018 1006   PLT 208 09/23/2020 0523   PLT 205 07/27/2018 1006   MCV 92.4 09/23/2020 0523   MCV 88 07/27/2018 1006   MCH 29.7 09/23/2020 0523   MCHC 32.1 09/23/2020 0523   RDW 16.4 (H) 09/23/2020 0523   RDW 16.1 (H) 07/27/2018 1006   LYMPHSABS 0.5 (L) 09/22/2020 0440   LYMPHSABS 1.1 07/27/2018 1006   MONOABS 0.1 09/22/2020 0440   EOSABS 0.0 09/22/2020 0440   EOSABS 0.3 07/27/2018 1006   BASOSABS 0.0 09/22/2020 0440   BASOSABS 0.0 07/27/2018 1006    ASSESSMENT AND PLAN: 1. Hospital discharge follow-up No further angioedema since hospital discharge.  He has an EpiPen which he keeps with him.  2. Essential hypertension At goal.  Continue current medications and low-salt diet  3. CAD in native artery Stable at this time.  Denies any chest pains or increasing shortness of breath.  4. Chronic obstructive pulmonary disease, unspecified COPD type ([place]) More stable now that he is using Symbicort consistently.  5. CKD (chronic kidney disease) stage 5, GFR less than 15 ml/min ([facility]) Followed by nephrology.    Patient was given the opportunity to ask questions.  Patient verbalized understanding of the plan and was able to repeat key elements of the plan.   No orders of the defined types were placed in this encounter.    Requested Prescriptions    No prescriptions requested or ordered in  this encounter    No follow-ups on file.  Karle Plumber, MD, FACP

## 2020-10-03 ENCOUNTER — Other Ambulatory Visit: Payer: Self-pay | Admitting: Nephrology

## 2020-10-06 MED FILL — CALCITRIOL 0.5 MCG CAPS: 0.5 | 30 days supply | Qty: 30 | Fill #0

## 2020-10-10 ENCOUNTER — Other Ambulatory Visit: Payer: Self-pay

## 2020-10-10 ENCOUNTER — Other Ambulatory Visit (HOSPITAL_COMMUNITY)
Admission: RE | Admit: 2020-10-10 | Discharge: 2020-10-10 | Disposition: A | Discharge status: Home / Self Care | Payer: 59 | Source: Ambulatory Visit | Attending: Internal Medicine | Admitting: Internal Medicine

## 2020-10-10 ENCOUNTER — Other Ambulatory Visit: Payer: 59 | Admitting: *Deleted

## 2020-10-10 DIAGNOSIS — Z20822 Contact with and (suspected) exposure to covid-19: Secondary | ICD-10-CM | POA: Insufficient documentation

## 2020-10-10 DIAGNOSIS — Z01812 Encounter for preprocedural laboratory examination: Secondary | ICD-10-CM | POA: Insufficient documentation

## 2020-10-10 DIAGNOSIS — I469 Cardiac arrest, cause unspecified: Secondary | ICD-10-CM

## 2020-10-10 DIAGNOSIS — I255 Ischemic cardiomyopathy: Secondary | ICD-10-CM

## 2020-10-10 LAB — CBC WITH DIFFERENTIAL/PLATELET
Basophils Absolute: 0 10*3/uL (ref 0.0–0.2)
Basos: 0 %
EOS (ABSOLUTE): 0.3 10*3/uL (ref 0.0–0.4)
Eos: 4 %
Hematocrit: 34.8 % — ABNORMAL LOW (ref 37.5–51.0)
Hemoglobin: 11.4 g/dL — ABNORMAL LOW (ref 13.0–17.7)
Immature Grans (Abs): 0 10*3/uL (ref 0.0–0.1)
Immature Granulocytes: 0 %
Lymphocytes Absolute: 0.9 10*3/uL (ref 0.7–3.1)
Lymphs: 13 %
MCH: 29.5 pg (ref 26.6–33.0)
MCHC: 32.8 g/dL (ref 31.5–35.7)
MCV: 90 fL (ref 79–97)
Monocytes Absolute: 0.7 10*3/uL (ref 0.1–0.9)
Monocytes: 11 %
Neutrophils Absolute: 4.9 10*3/uL (ref 1.4–7.0)
Neutrophils: 72 %
Platelets: 179 10*3/uL (ref 150–450)
RBC: 3.87 x10E6/uL — ABNORMAL LOW (ref 4.14–5.80)
RDW: 16.1 % — ABNORMAL HIGH (ref 11.6–15.4)
WBC: 6.8 10*3/uL (ref 3.4–10.8)

## 2020-10-10 LAB — BASIC METABOLIC PANEL
BUN/Creatinine Ratio: 8 — ABNORMAL LOW (ref 9–20)
BUN: 79 mg/dL (ref 6–24)
CO2: 21 mmol/L (ref 20–29)
Calcium: 10.2 mg/dL (ref 8.7–10.2)
Chloride: 105 mmol/L (ref 96–106)
Creatinine, Ser: 9.85 mg/dL — ABNORMAL HIGH (ref 0.76–1.27)
GFR calc Af Amer: 6 mL/min/{1.73_m2} — ABNORMAL LOW (ref >59)
GFR calc non Af Amer: 5 mL/min/{1.73_m2} — ABNORMAL LOW (ref >59)
Glucose: 128 mg/dL — ABNORMAL HIGH (ref 65–99)
Potassium: 4.2 mmol/L (ref 3.5–5.2)
Sodium: 146 mmol/L — ABNORMAL HIGH (ref 134–144)

## 2020-10-10 LAB — SARS CORONAVIRUS 2 (TAT 6-24 HRS): SARS Coronavirus 2: NEGATIVE

## 2020-10-10 NOTE — Progress Notes (Signed)
Instructed patient on the following items: Arrival time 0530 Nothing to eat or drink after midnight No meds AM of procedure Responsible person to drive you home and stay with you for 24 hrs Wash with special soap night before and morning of procedure  

## 2020-10-12 NOTE — Anesthesia Preprocedure Evaluation (Addendum)
Anesthesia Evaluation  Patient identified by MRN, date of birth, ID band Patient awake    Reviewed: Allergy & Precautions, NPO status , Patient's Chart, lab work & pertinent test results, reviewed documented beta blocker date and time   Airway Mallampati: II  TM Distance: >3 FB Neck ROM: Full    Dental no notable dental hx. (+) Teeth Intact, Dental Advisory Given   Pulmonary shortness of breath, asthma , sleep apnea and Continuous Positive Airway Pressure Ventilation ,    Pulmonary exam normal breath sounds clear to auscultation       Cardiovascular hypertension, Pt. on home beta blockers and Pt. on medications + CAD, + Past MI, + Cardiac Stents (2007) and +CHF  Normal cardiovascular exam+ dysrhythmias (VF arrest 2015 now s/p AICD ) Ventricular Fibrillation + Cardiac Defibrillator  Rhythm:Regular Rate:Normal  Device check 08/2020 Subcutaneous ICD check in clinic. ERI as of 07/25/20. No untreated episodes; no treated episodes; no shocks delivered. Electrode impedance status okay. No programming changes. Scheduled for virtual visit 08/19/20 with Dr. Rayann Heman. Will discuss with [facility] to clarify urgency of generator replacement (not an advisory device).  TTE 06/2020 1. Left ventricular ejection fraction, by estimation, is 30 to 35%. The left ventricle has moderately decreased function. The left ventricle demonstrates global hypokinesis. There is mild concentric left ventricular  hypertrophy. Left ventricular diastolic parameters are consistent with Grade II diastolic dysfunction  (pseudonormalization). Elevated left atrial pressure.  2. Right ventricular systolic function is mildly reduced. The right ventricular size is mildly enlarged. There is mildly elevated pulmonary artery systolic pressure. The estimated right ventricular systolic pressure is 58.3 mmHg.  3. The mitral valve is grossly normal. Mild mitral valve  regurgitation. No evidence of mitral stenosis.  4. The aortic valve is tricuspid. Aortic valve regurgitation is not visualized. No aortic stenosis is present.  5. The inferior vena cava is dilated in size with <50% respiratory variability, suggesting right atrial pressure of 15 mmHg.   Neuro/Psych negative neurological ROS  negative psych ROS   GI/Hepatic Neg liver ROS, GERD  ,  Endo/Other  Morbid obesity  Renal/GU Renal InsufficiencyRenal disease (Stage V, not yet on dialysis)  negative genitourinary   Musculoskeletal negative musculoskeletal ROS (+)   Abdominal   Peds  Hematology negative hematology ROS (+)   Anesthesia Other Findings   Reproductive/Obstetrics                            Anesthesia Physical Anesthesia Plan  ASA: III  Anesthesia Plan: General   Post-op Pain Management:    Induction: Intravenous  PONV Risk Score and Plan: 2 and Midazolam, Dexamethasone and Ondansetron  Airway Management Planned: Oral ETT and Video Laryngoscope Planned  Additional Equipment:   Intra-op Plan:   Post-operative Plan: Extubation in OR  Informed Consent: I have reviewed the patients History and Physical, chart, labs and discussed the procedure including the risks, benefits and alternatives for the proposed anesthesia with the patient or authorized representative who has indicated his/her understanding and acceptance.     Dental advisory given  Plan Discussed with: CRNA  Anesthesia Plan Comments:        Anesthesia Quick Evaluation

## 2020-10-13 ENCOUNTER — Ambulatory Visit (HOSPITAL_COMMUNITY): Payer: 59 | Admitting: Anesthesiology

## 2020-10-13 ENCOUNTER — Ambulatory Visit (HOSPITAL_COMMUNITY)
Admission: RE | Disposition: A | Discharge status: Home / Self Care | Payer: 59 | Source: Home / Self Care | Attending: Internal Medicine

## 2020-10-13 ENCOUNTER — Ambulatory Visit (HOSPITAL_COMMUNITY)
Admission: RE | Admit: 2020-10-13 | Discharge: 2020-10-13 | Disposition: A | Discharge status: Home / Self Care | Payer: 59 | Attending: Internal Medicine | Admitting: Internal Medicine

## 2020-10-13 ENCOUNTER — Other Ambulatory Visit: Payer: Self-pay

## 2020-10-13 ENCOUNTER — Encounter (HOSPITAL_COMMUNITY): Payer: Self-pay | Admitting: Internal Medicine

## 2020-10-13 DIAGNOSIS — Z888 Allergy status to other drugs, medicaments and biological substances status: Secondary | ICD-10-CM | POA: Diagnosis not present

## 2020-10-13 DIAGNOSIS — Z8674 Personal history of sudden cardiac arrest: Secondary | ICD-10-CM | POA: Insufficient documentation

## 2020-10-13 DIAGNOSIS — N186 End stage renal disease: Secondary | ICD-10-CM | POA: Insufficient documentation

## 2020-10-13 DIAGNOSIS — I509 Heart failure, unspecified: Secondary | ICD-10-CM | POA: Insufficient documentation

## 2020-10-13 DIAGNOSIS — Z880 Allergy status to penicillin: Secondary | ICD-10-CM | POA: Diagnosis not present

## 2020-10-13 DIAGNOSIS — I255 Ischemic cardiomyopathy: Secondary | ICD-10-CM | POA: Insufficient documentation

## 2020-10-13 DIAGNOSIS — I132 Hypertensive heart and chronic kidney disease with heart failure and with stage 5 chronic kidney disease, or end stage renal disease: Secondary | ICD-10-CM | POA: Diagnosis not present

## 2020-10-13 DIAGNOSIS — Z4502 Encounter for adjustment and management of automatic implantable cardiac defibrillator: Secondary | ICD-10-CM | POA: Diagnosis present

## 2020-10-13 DIAGNOSIS — I4901 Ventricular fibrillation: Secondary | ICD-10-CM

## 2020-10-13 DIAGNOSIS — I472 Ventricular tachycardia: Secondary | ICD-10-CM | POA: Diagnosis not present

## 2020-10-13 HISTORY — PX: SUBQ ICD CHANGEOUT: EP1235

## 2020-10-13 LAB — POCT I-STAT, CHEM 8
BUN: 81 mg/dL — ABNORMAL HIGH (ref 6–20)
Calcium, Ion: 1.27 mmol/L (ref 1.15–1.40)
Chloride: 106 mmol/L (ref 98–111)
Creatinine, Ser: 11 mg/dL — ABNORMAL HIGH (ref 0.61–1.24)
Glucose, Bld: 102 mg/dL — ABNORMAL HIGH (ref 70–99)
HCT: 32 % — ABNORMAL LOW (ref 39.0–52.0)
Hemoglobin: 10.9 g/dL — ABNORMAL LOW (ref 13.0–17.0)
Potassium: 3.9 mmol/L (ref 3.5–5.1)
Sodium: 142 mmol/L (ref 135–145)
TCO2: 24 mmol/L (ref 22–32)

## 2020-10-13 SURGERY — SUBQ ICD CHANGEOUT
Anesthesia: General

## 2020-10-13 MED ORDER — SODIUM CHLORIDE 0.9 % IV SOLN
80.0000 mg | INTRAVENOUS | Status: AC
  Administered 2020-10-13: 80 mg

## 2020-10-13 MED ORDER — SUGAMMADEX SODIUM 200 MG/2ML IV SOLN
INTRAVENOUS | Status: DC | PRN
  Administered 2020-10-13: 400 mg via INTRAVENOUS

## 2020-10-13 MED ORDER — PHENYLEPHRINE HCL-NACL 10-0.9 MG/250ML-% IV SOLN
INTRAVENOUS | Status: DC | PRN
  Administered 2020-10-13: 25 ug/min via INTRAVENOUS

## 2020-10-13 MED ORDER — ROCURONIUM BROMIDE 10 MG/ML (PF) SYRINGE
PREFILLED_SYRINGE | INTRAVENOUS | Status: DC | PRN
  Administered 2020-10-13: 100 mg via INTRAVENOUS

## 2020-10-13 MED ORDER — VANCOMYCIN HCL 1500 MG/300ML IV SOLN
1500.0000 mg | INTRAVENOUS | Status: AC
  Administered 2020-10-13: 1500 mg via INTRAVENOUS
  Filled 2020-10-13: qty 300

## 2020-10-13 MED ORDER — SODIUM CHLORIDE 0.9 % IV SOLN: INTRAVENOUS | Status: DC

## 2020-10-13 MED ORDER — MIDAZOLAM HCL 5 MG/5ML IJ SOLN
INTRAMUSCULAR | Status: DC | PRN
  Administered 2020-10-13 (×2): 1 mg via INTRAVENOUS

## 2020-10-13 MED ORDER — CHLOROPROCAINE HCL 2 % IJ SOLN
INTRAMUSCULAR | Status: DC | PRN
  Administered 2020-10-13: 60 mL

## 2020-10-13 MED ORDER — ONDANSETRON HCL 4 MG/2ML IJ SOLN
INTRAMUSCULAR | Status: DC | PRN
  Administered 2020-10-13: 4 mg via INTRAVENOUS

## 2020-10-13 MED ORDER — SODIUM CHLORIDE 0.9 % IV SOLN
INTRAVENOUS | Status: AC
  Filled 2020-10-13: qty 2

## 2020-10-13 MED ORDER — VANCOMYCIN HCL IN DEXTROSE 1-5 GM/200ML-% IV SOLN
INTRAVENOUS | Status: AC
  Filled 2020-10-13: qty 200

## 2020-10-13 MED ORDER — PROPOFOL 10 MG/ML IV BOLUS
INTRAVENOUS | Status: DC | PRN
  Administered 2020-10-13: 150 mg via INTRAVENOUS

## 2020-10-13 MED ORDER — ONDANSETRON HCL 4 MG/2ML IJ SOLN: 4.0000 mg | Freq: Four times a day (QID) | INTRAMUSCULAR | Status: DC | PRN

## 2020-10-13 MED ORDER — ACETAMINOPHEN 325 MG PO TABS
ORAL_TABLET | ORAL | Status: AC
  Filled 2020-10-13: qty 2

## 2020-10-13 MED ORDER — LIDOCAINE HCL 1 % IJ SOLN
INTRAMUSCULAR | Status: AC
  Filled 2020-10-13: qty 60

## 2020-10-13 MED ORDER — FENTANYL CITRATE (PF) 250 MCG/5ML IJ SOLN
INTRAMUSCULAR | Status: DC | PRN
  Administered 2020-10-13: 50 ug via INTRAVENOUS

## 2020-10-13 MED ORDER — ACETAMINOPHEN 325 MG PO TABS
325.0000 mg | ORAL_TABLET | ORAL | Status: DC | PRN
  Administered 2020-10-13: 650 mg via ORAL

## 2020-10-13 MED ORDER — FENTANYL CITRATE (PF) 100 MCG/2ML IJ SOLN: 25.0000 ug | INTRAMUSCULAR | Status: DC | PRN

## 2020-10-13 MED ORDER — PHENYLEPHRINE HCL (PRESSORS) 10 MG/ML IV SOLN
INTRAVENOUS | Status: DC | PRN
  Administered 2020-10-13 (×2): 80 ug via INTRAVENOUS
  Administered 2020-10-13: 40 ug via INTRAVENOUS

## 2020-10-13 MED ORDER — DEXAMETHASONE SODIUM PHOSPHATE 10 MG/ML IJ SOLN
INTRAMUSCULAR | Status: DC | PRN
  Administered 2020-10-13: 5 mg via INTRAVENOUS

## 2020-10-13 MED ORDER — LIDOCAINE HCL (PF) 1 % IJ SOLN: INTRAMUSCULAR | Status: DC | PRN

## 2020-10-13 SURGICAL SUPPLY — 6 items
CABLE SURGICAL S-101-97-12 (CABLE) ×2 IMPLANT
ICD SUBCU MRI EMBLEM A219 (ICD Generator) ×1 IMPLANT
MAT PREVALON FULL STRYKER (MISCELLANEOUS) ×1 IMPLANT
PAD PRO RADIOLUCENT 2001M-C (PAD) ×2 IMPLANT
SHEATH PROBE COVER 6X72 (BAG) ×1 IMPLANT
TRAY PACEMAKER INSERTION (PACKS) ×2 IMPLANT

## 2020-10-13 NOTE — Discharge Instructions (Signed)
Wound care instructions Keep incision clean and dry for 10 days. No driving for 2 days.  You can remove outer dressing tomorrow. Leave steri-strips (little pieces of tape) on until seen in the office for wound check appointment. Call the office [phone]) for redness, drainage, swelling, or fever.    After Your ICD (Implantable Cardiac Defibrillator)   . You have a  ICD/ [employer]  ACTIVITY . Do not lift your arm above shoulder height for 1 week after your procedure. After 7 days, you may progress as below.     Monday October 20, 2020  Tuesday October 21, 2020 Wednesday October 22, 2020 Thursday October 23, 2020   . Do not lift, push, pull, or carry anything over 10 pounds with the affected arm until 6 weeks (Monday November 24, 2020 ) after your procedure.   . Do NOT DRIVE until you have been seen for your wound check, or as long as instructed by your healthcare provider.   . Ask your healthcare provider when you can go back to work   INCISION/Dressing .   Marland Kitchen Monitor your defibrillator site for redness, swelling, and drainage. Call the device clinic at [phone] if you experience these symptoms or fever/chills.  . .  . Avoid lotions, ointments, or perfumes over your incision until it is well-healed.  . You may use a hot tub or a pool AFTER your wound check appointment if the incision is completely closed.  . Your ICD is designed to protect you from life threatening heart rhythms. Because of this, you may receive a shock.   o 1 shock with no symptoms:  Call the office during business hours. o 1 shock with symptoms (chest pain, chest pressure, dizziness, lightheadedness, shortness of breath, overall feeling unwell):  Call 911. o If you experience 2 or more shocks in 24 hours:  Call 911. o If you receive a shock, you should not drive for 6 months per the [place] DMV IF you receive appropriate therapy from your ICD.   . ICD Alerts:  Some alerts are vibratory and  others beep. These are NOT emergencies. Please call our office to let us know. If this occurs at night or on weekends, it can wait until the next business day. Send a remote transmission.  . If your device is capable of reading fluid status (for heart failure), you will be offered monthly monitoring to review this with you.   DEVICE MANAGEMENT . Remote monitoring is used to monitor your ICD from home. This monitoring is scheduled every 91 days by our office. It allows [place] to keep an eye on the functioning of your device to ensure it is working properly. You will routinely see your Electrophysiologist annually (more often if necessary).   . You should receive your ID card for your new device in 4-8 weeks. Keep this card with you at all times once received. Consider wearing a medical alert bracelet or necklace.  . Your ICD  may be MRI compatible. This will be discussed at your next office visit/wound check.  You should avoid contact with strong electric or magnetic fields.    Do not use amateur (ham) radio equipment or electric (arc) welding torches. MP3 player headphones with magnets should not be used. Some devices are safe to use if held at least 12 inches (30 cm) from your defibrillator. These include power tools, lawn mowers, and speakers. If you are unsure if something is safe to use, ask your health care provider.  When using your cell phone, hold it to the ear that is on the opposite side from the defibrillator. Do not leave your cell phone in a pocket over the defibrillator.   You may safely use electric blankets, heating pads, computers, and microwave ovens.  Call the office right away if:  You have chest pain.  You feel more than one shock.  You feel more short of breath than you have felt before.  You feel more light-headed than you have felt before.  Your incision starts to open up.  This information is not intended to replace advice given to you by your health care  provider. Make sure you discuss any questions you have with your health care provider.        I

## 2020-10-13 NOTE — H&P (Signed)
Electrophysiology TeleHealth Note  Due to national recommendations of social distancing due to [place] 19, an audio telehealth visit is felt to be most appropriate for this patient at this time.  Verbal consent was obtained by me for the telehealth visit today.  The patient does not have capability for a virtual visit.  A phone visit is therefore required today.   Date:  08/25/2020   ID:  Victor Thompson, DOB Sep 25, 1961, MRN [id number]  Location: patient's home  Provider location:  [facility]  Evaluation Performed: Follow-up visit  PCP:  [name], MD          Electrophysiologist:  Dr Rayann Heman  Chief Complaint:  palpitations  History of Present Illness:    Victor Thompson is a 59 y.o. male who presents via telehealth conferencing today.  Since last being seen in our clinic, the patient reports doing very well.  He is reasonably active.  SOB is stable.  Today, he denies symptoms of palpitations, chest pain, lower extremity edema, dizziness, presyncope, or syncope.  The patient is otherwise without complaint today.         Past Medical History:  Diagnosis Date  . AICD (automatic cardioverter/defibrillator) present    2015  . Asthma    no meds  . Cardiac arrest ([place]) 2015  . CHF (congestive heart failure) ([place])   . Chronic kidney disease    ckd -stage 5  . Coronary artery disease   . Hypertension   . Myocardial infarction ([place])   . Obesity   . Pneumonia   . Pulmonary edema   . Shortness of breath dyspnea   . Sleep apnea    USES CPAP  . Wears glasses          Past Surgical History:  Procedure Laterality Date  . AV FISTULA PLACEMENT Right 03/15/2019   Procedure: RIGHT ARM ARTERIOVENOUS (AV) FISTULA CREATION;  Surgeon: Angelia Mould, MD;  Location: [place];  Service: Vascular;  Laterality: Right;  . COLONOSCOPY    . COLONOSCOPY W/ BIOPSIES AND POLYPECTOMY    . CORONARY STENT PLACEMENT  2007  . IMPLANTABLE CARDIOVERTER  DEFIBRILLATOR IMPLANT  2015          Current Outpatient Medications  Medication Sig Dispense Refill  . albuterol (PROVENTIL) (2.5 MG/3ML) 0.083% nebulizer solution Take 3 mLs (2.5 mg total) by nebulization every 6 (six) hours as needed for wheezing or shortness of breath. 75 mL 12  . albuterol (VENTOLIN HFA) 108 (90 Base) MCG/ACT inhaler Inhale 2 puffs into the lungs every 4 (four) hours as needed for wheezing or shortness of breath. 8 g 6  . amLODipine (NORVASC) 10 MG tablet Take 1 tablet (10 mg total) by mouth daily. 90 tablet 1  . aspirin EC 81 MG tablet Take 1 tablet (81 mg total) by mouth daily. 30 tablet 5  . atorvastatin (LIPITOR) 40 MG tablet TAKE 1 TABLET (40 MG TOTAL) BY MOUTH AT BEDTIME. 30 tablet 1  . budesonide-formoterol (SYMBICORT) 80-4.5 MCG/ACT inhaler Inhale 2 puffs into the lungs 2 (two) times daily. 1 Inhaler 6  . calcitRIOL (ROCALTROL) 0.5 MCG capsule Take 0.5 mcg by mouth daily.   11  . carvedilol (COREG) 25 MG tablet Take 1 tablet (25 mg total) by mouth 2 (two) times daily with a meal. 180 tablet 1  . Cholecalciferol (VITAMIN D-3) 125 MCG (5000 UT) TABS Take 5,000 Units by mouth daily.     Marland Kitchen EPINEPHrine 0.3 mg/0.3 mL IJ SOAJ injection Inject 0.3 mLs (  0.3 mg total) into the muscle once as needed for anaphylaxis. 1 each 1  . fluticasone (FLONASE) 50 MCG/ACT nasal spray Place 1 spray into both nostrils daily. 16 g 2  . hydrALAZINE (APRESOLINE) 25 MG tablet Take 1 tablet (25 mg total) by mouth 3 (three) times daily. 90 tablet 1  . isosorbide mononitrate (IMDUR) 30 MG 24 hr tablet Take 1 tablet (30 mg total) by mouth daily. 90 tablet 1  . loratadine (CLARITIN) 10 MG tablet Take 10 mg by mouth daily as needed for allergies.    Marland Kitchen torsemide (DEMADEX) 20 MG tablet Take 2 tablets (40 mg total) by mouth 2 (two) times daily for 5 days. May take the medication 40 mg p.o. twice daily x5 days then daily, per instruction by cardiology and nephrologist. 20 tablet 0   No current  facility-administered medications for this visit.    Allergies:   Ace inhibitors, Influenza vaccines, Ketorolac, Lidocaine, Lisinopril, and Penicillins   Social History:  The patient  reports that he has never smoked. He has never used smokeless tobacco. He reports that he does not drink alcohol and does not use drugs.   ROS:  Please see the history of present illness.   All other systems are personally reviewed and negative.    Exam:    Vital Signs:  There were no vitals taken for this visit.  Well sounding, alert and conversant   Labs/Other Tests and Data Reviewed:    Recent Labs: 11/26/2019: ALT 15 06/14/2020: B Natriuretic Peptide 931.6 06/15/2020: Hemoglobin 11.1; Magnesium 2.1; Platelets 200 06/30/2020: BUN 56; Creatinine, Ser 9.56; Potassium 4.1; Sodium 145      Wt Readings from Last 3 Encounters:  06/30/20 (!) 268 lb 9.6 oz (121.8 kg)  06/15/20 263 lb 14.4 oz (119.7 kg)  04/07/20 258 lb (117 kg)     ASSESSMENT & PLAN:    1.  Prior VF arrest  His ICD is at [facility]. He has a subQ ICD.  I have spoken with Dr Lovena Le who will assist with generator change.  We will plan to use general anesthesia to allow for DFT testing. Risks, benefits, and alternatives to S- ICD pulse generator replacement were discussed in detail today.  The patient understands that risks include but are not limited to bleeding, infection, MI, stroke, death, inappropriate shocks, damage to his existing leads, and lead dislodgement and wishes to proceed.  We will therefore schedule the procedure at the next available time.  2. Ischemic CM/ chronic systolic dysfunction EF remains 30-35% No ischemic symptoms He has QRS < 150 msec.  I would therefore not advise upgrade to CRT at this time. Continue medicine optimization   Overdue for general cardiology follow-up.  Risks, benefits and potential toxicities for medications prescribed and/or refilled reviewed with patient today.    Patient Risk:   after full review of this patients clinical status, I feel that they are at moderate risk at this time.  Today, I have spent 15 minutes with the patient with telehealth technology discussing arrhythmia management .    Signed, [name], MD   EP Attending  Patient seen and examined. Agree with the findings as noted by Dr. Rayann Heman. The patient presents today to undergo S-ICD gen change out. He has a h/o prior VF arrest and has ESRD approaching HD. He has reached ERI. I have discussed the indications/risks/benefits/goals/expectations of ICD gen change out and he wishes to proceed.   Carleene Overlie Rechy Bost,MD

## 2020-10-13 NOTE — Transfer of Care (Signed)
Immediate Anesthesia Transfer of Care Note  Patient: Victor Thompson  Procedure(s) Performed: [place] (N/A )  Patient Location: Cath Lab  Anesthesia Type:General  Level of Consciousness: drowsy and patient cooperative  Airway & Oxygen Therapy: Patient Spontanous Breathing and Patient connected to face mask oxygen  Post-op Assessment: Report given to RN and Post -op Vital signs reviewed and stable  Post vital signs: Reviewed and stable  Last Vitals:  Vitals Value Taken Time  BP 165/63 10/13/20 0915  Temp    Pulse 69 10/13/20 0916  Resp 19 10/13/20 0916  SpO2 100 % 10/13/20 0916  Vitals shown include unvalidated device data.  Last Pain:  Vitals:   10/13/20 0636  TempSrc:   PainSc: 0-No pain         Complications: No complications documented.

## 2020-10-13 NOTE — Addendum Note (Signed)
Addendum  created 10/13/20 1051 by Alphons Burgert, Dahlia Bailiff, CRNA   Flowsheet accepted, Intraprocedure Flowsheets edited

## 2020-10-13 NOTE — Anesthesia Postprocedure Evaluation (Signed)
Anesthesia Post Note  Patient: Victor Thompson  Procedure(s) Performed: [place] (N/A )     Patient location during evaluation: PACU Anesthesia Type: General Level of consciousness: awake and alert Pain management: pain level controlled Vital Signs Assessment: post-procedure vital signs reviewed and stable Respiratory status: spontaneous breathing, nonlabored ventilation, respiratory function stable and patient connected to nasal cannula oxygen Cardiovascular status: blood pressure returned to baseline and stable Postop Assessment: no apparent nausea or vomiting Anesthetic complications: no   No complications documented.  Last Vitals:  Vitals:   10/13/20 0940 10/13/20 1004  BP: (!) 157/70   Pulse: 70 73  Resp: 19 14  Temp: 36.6 C   SpO2: 100%     Last Pain:  Vitals:   10/13/20 1004  TempSrc:   PainSc: 7                  Berthe Oley L Karon Heckendorn

## 2020-10-14 ENCOUNTER — Telehealth (HOSPITAL_COMMUNITY): Payer: Self-pay | Admitting: Vascular Surgery

## 2020-10-14 MED FILL — Lidocaine HCl Local Inj 1%: INTRAMUSCULAR | Qty: 60 | Status: AC

## 2020-10-14 MED FILL — Gentamicin Sulfate Inj 40 MG/ML: INTRAMUSCULAR | Qty: 80 | Status: AC

## 2020-10-14 NOTE — Telephone Encounter (Signed)
Left pt message to make new chf appt w/ either provider

## 2020-10-23 ENCOUNTER — Ambulatory Visit (INDEPENDENT_AMBULATORY_CARE_PROVIDER_SITE_OTHER): Payer: 59 | Admitting: Emergency Medicine

## 2020-10-23 ENCOUNTER — Other Ambulatory Visit: Payer: Self-pay

## 2020-10-23 DIAGNOSIS — I472 Ventricular tachycardia, unspecified: Secondary | ICD-10-CM

## 2020-10-23 LAB — CUP PACEART INCLINIC DEVICE CHECK
Date Time Interrogation Session: 20211118104234
Implantable Lead Implant Date: 20150507
Implantable Lead Location: 753862
Implantable Lead Model: 3400
Implantable Pulse Generator Implant Date: 20211108
Pulse Gen Serial Number: 146303

## 2020-10-23 NOTE — Progress Notes (Signed)
Subcutaneous ICD/wound check in clinic. No redness, drainage, warmth noted. Difficult to tell if site has swelling or patients skin appears to fold in area. Patient advised he can use ice to minimize any risk increased swelling. 0 untreated episodes; 0 treated episodes; 0 shocks delivered. Electrode impedance status okay. No programming changes. Remaining longevity to ERI 100%. Patient would like to use home remote monitor. Joey from [place] made aware and will have box shipped to patient house. Expect in December.

## 2020-10-23 NOTE — Patient Instructions (Signed)
Please monitor for increased swelling, redness, drainage fever or chills. You may use an ice pack 3 times a day 20 minutes at a time on implant site to reduce swelling. We will get a monitor box shipped to your house, expect sometime in December. Please call the device clinic when you receive your box.  [facility] [phone]

## 2020-10-24 ENCOUNTER — Other Ambulatory Visit: Payer: Self-pay | Admitting: Internal Medicine

## 2020-10-24 DIAGNOSIS — I5042 Chronic combined systolic (congestive) and diastolic (congestive) heart failure: Secondary | ICD-10-CM

## 2020-10-24 DIAGNOSIS — I2510 Atherosclerotic heart disease of native coronary artery without angina pectoris: Secondary | ICD-10-CM

## 2020-10-24 MED FILL — ISOSORBIDE MN ER 30 MG TAB: 30 | 90 days supply | Qty: 90 | Fill #0

## 2020-10-24 MED FILL — TORSEMIDE 20 MG TABLET: 20 | 30 days supply | Qty: 60 | Fill #0

## 2020-11-03 ENCOUNTER — Other Ambulatory Visit: Payer: Self-pay

## 2020-11-03 ENCOUNTER — Ambulatory Visit: Payer: 59 | Attending: Internal Medicine

## 2020-11-04 ENCOUNTER — Ambulatory Visit: Payer: 59

## 2020-11-05 NOTE — H&P (Signed)
I have seen Victor Thompson is a 59 y.o. male in the office today who has been referred by Dr. Rayann Heman for consideration of ICD implant for secondary prevention of sudden death.  The patient's chart has been reviewed and they meet criteria for ICD implant.  I have had a thorough discussion with the patient reviewing options.  The patient and their family (if available) have had opportunities to ask questions and have them answered. The patient and I have decided together through a shared decision making process to proeed with ICD at this time.  Risks, benefits, alternatives to ICD implantation were discussed in detail with the patient today. The patient  understands that the risks include but are not limited to bleeding, infection, pneumothorax, perforation, tamponade, vascular damage, renal failure, MI, stroke, death, inappropriate shocks, and lead dislodgement and he wishes to proceed.  We will therefore schedule device implantation at the next available time.

## 2020-11-07 ENCOUNTER — Other Ambulatory Visit: Payer: Self-pay | Admitting: Internal Medicine

## 2020-11-07 DIAGNOSIS — E785 Hyperlipidemia, unspecified: Secondary | ICD-10-CM

## 2020-11-07 DIAGNOSIS — I2510 Atherosclerotic heart disease of native coronary artery without angina pectoris: Secondary | ICD-10-CM

## 2020-11-07 MED FILL — ATORVASTATIN CALCIUM 40 MG: 40 | 30 days supply | Qty: 30 | Fill #0

## 2020-11-10 MED FILL — AMLODIPINE BESYLATE 10 MG T: 10 | 30 days supply | Qty: 30 | Fill #5

## 2020-11-11 ENCOUNTER — Other Ambulatory Visit: Payer: Self-pay | Admitting: Nephrology

## 2020-11-11 MED FILL — CALCITRIOL 0.5 MCG CAPS: 0.5 | 28 days supply | Qty: 12 | Fill #0

## 2020-11-24 ENCOUNTER — Other Ambulatory Visit: Payer: Self-pay | Admitting: Physician Assistant

## 2020-11-24 ENCOUNTER — Other Ambulatory Visit: Payer: Self-pay | Admitting: Internal Medicine

## 2020-11-24 DIAGNOSIS — I2510 Atherosclerotic heart disease of native coronary artery without angina pectoris: Secondary | ICD-10-CM

## 2020-11-24 DIAGNOSIS — I10 Essential (primary) hypertension: Secondary | ICD-10-CM

## 2020-11-24 DIAGNOSIS — I5042 Chronic combined systolic (congestive) and diastolic (congestive) heart failure: Secondary | ICD-10-CM

## 2020-11-24 MED FILL — CARVEDILOL 25 MG TABLET: 25 | 30 days supply | Qty: 60 | Fill #0

## 2020-12-08 ENCOUNTER — Other Ambulatory Visit: Payer: Self-pay | Admitting: Internal Medicine

## 2020-12-08 MED FILL — ALBUTEROL 0.083% INHAL SOLN: (2.5 MG/3ML | 6 days supply | Qty: 75 | Fill #0

## 2020-12-09 ENCOUNTER — Other Ambulatory Visit: Payer: Self-pay

## 2020-12-09 ENCOUNTER — Emergency Department (HOSPITAL_COMMUNITY): Payer: HRSA Program

## 2020-12-09 ENCOUNTER — Other Ambulatory Visit (HOSPITAL_COMMUNITY): Payer: Self-pay | Admitting: Student

## 2020-12-09 ENCOUNTER — Emergency Department (HOSPITAL_COMMUNITY)
Admission: EM | Admit: 2020-12-09 | Discharge: 2020-12-09 | Disposition: A | Discharge status: Home / Self Care | Payer: HRSA Program | Attending: Emergency Medicine | Admitting: Emergency Medicine

## 2020-12-09 DIAGNOSIS — I5042 Chronic combined systolic (congestive) and diastolic (congestive) heart failure: Secondary | ICD-10-CM | POA: Insufficient documentation

## 2020-12-09 DIAGNOSIS — Z79899 Other long term (current) drug therapy: Secondary | ICD-10-CM | POA: Insufficient documentation

## 2020-12-09 DIAGNOSIS — U071 COVID-19: Secondary | ICD-10-CM | POA: Insufficient documentation

## 2020-12-09 DIAGNOSIS — R0602 Shortness of breath: Secondary | ICD-10-CM | POA: Diagnosis present

## 2020-12-09 DIAGNOSIS — N185 Chronic kidney disease, stage 5: Secondary | ICD-10-CM | POA: Insufficient documentation

## 2020-12-09 DIAGNOSIS — I132 Hypertensive heart and chronic kidney disease with heart failure and with stage 5 chronic kidney disease, or end stage renal disease: Secondary | ICD-10-CM | POA: Insufficient documentation

## 2020-12-09 DIAGNOSIS — J45909 Unspecified asthma, uncomplicated: Secondary | ICD-10-CM | POA: Insufficient documentation

## 2020-12-09 DIAGNOSIS — Z7982 Long term (current) use of aspirin: Secondary | ICD-10-CM | POA: Insufficient documentation

## 2020-12-09 LAB — CBC
HCT: 33 % — ABNORMAL LOW (ref 39.0–52.0)
Hemoglobin: 10.8 g/dL — ABNORMAL LOW (ref 13.0–17.0)
MCH: 30.7 pg (ref 26.0–34.0)
MCHC: 32.7 g/dL (ref 30.0–36.0)
MCV: 93.8 fL (ref 80.0–100.0)
Platelets: 204 10*3/uL (ref 150–400)
RBC: 3.52 MIL/uL — ABNORMAL LOW (ref 4.22–5.81)
RDW: 16.3 % — ABNORMAL HIGH (ref 11.5–15.5)
WBC: 5.5 10*3/uL (ref 4.0–10.5)
nRBC: 0 % (ref 0.0–0.2)

## 2020-12-09 LAB — BASIC METABOLIC PANEL
Anion gap: 16 — ABNORMAL HIGH (ref 5–15)
BUN: 75 mg/dL — ABNORMAL HIGH (ref 6–20)
CO2: 17 mmol/L — ABNORMAL LOW (ref 22–32)
Calcium: 8.2 mg/dL — ABNORMAL LOW (ref 8.9–10.3)
Chloride: 106 mmol/L (ref 98–111)
Creatinine, Ser: 10.63 mg/dL — ABNORMAL HIGH (ref 0.61–1.24)
GFR, Estimated: 5 mL/min — ABNORMAL LOW (ref >60)
Glucose, Bld: 101 mg/dL — ABNORMAL HIGH (ref 70–99)
Potassium: 3.7 mmol/L (ref 3.5–5.1)
Sodium: 139 mmol/L (ref 135–145)

## 2020-12-09 LAB — TROPONIN I (HIGH SENSITIVITY): Troponin I (High Sensitivity): 56 ng/L — ABNORMAL HIGH (ref <18)

## 2020-12-09 LAB — RESP PANEL BY RT-PCR (RSV, FLU A&B, COVID)  RVPGX2
Influenza A by PCR: NEGATIVE
Influenza B by PCR: NEGATIVE
Resp Syncytial Virus by PCR: NEGATIVE
SARS Coronavirus 2 by RT PCR: POSITIVE — AB

## 2020-12-09 LAB — BRAIN NATRIURETIC PEPTIDE: B Natriuretic Peptide: 825.1 pg/mL — ABNORMAL HIGH (ref 0.0–100.0)

## 2020-12-09 MED ORDER — PREDNISONE 20 MG PO TABS: 20.0000 mg | ORAL_TABLET | Freq: Every day | ORAL | 0 refills | Status: DC

## 2020-12-09 MED ORDER — PREDNISONE 10 MG PO TABS: 10.0000 mg | ORAL_TABLET | Freq: Every day | ORAL | 0 refills | Status: AC

## 2020-12-09 MED ORDER — ALBUTEROL SULFATE HFA 108 (90 BASE) MCG/ACT IN AERS
2.0000 | INHALATION_SPRAY | Freq: Once | RESPIRATORY_TRACT | Status: AC
  Administered 2020-12-09: 2 via RESPIRATORY_TRACT
  Filled 2020-12-09: qty 6.7

## 2020-12-09 NOTE — Discharge Instructions (Addendum)
You have been seen here for URI like symptoms.  I will start you on steroids please take as prescribed.  Just be aware this medication can increase your blood pressure and heart rate.  Also given you albuterol inhaler you may do 1 to 2 puffs every 4-6 hours as needed for shortness of breath.  Please beware this medication can increase your heart rate.  I recommend taking Tylenol for fever control and please follow dosing on the back of bottle.  I recommend staying hydrated and if you do not an appetite, I recommend soups as this will provide you with fluids and calories.    you are Covid positive you must self quarantine for 10 days starting on symptom onset.  I would like you to contact "post Covid care" as they will provide you with information how to manage your Covid symptoms.  Come back to the emergency department if you develop chest pain, shortness of breath, severe abdominal pain, uncontrolled nausea, vomiting, diarrhea.

## 2020-12-09 NOTE — ED Triage Notes (Signed)
To ED via POV with c/o shortness of breath started 1 week ago, hx of CHF, asthma-- wheezing audible, no pedal edema---  Unable to speak in a complete sentence Vaccine x 2, no booster

## 2020-12-09 NOTE — ED Provider Notes (Signed)
[facility] DE[facility] Provider Note   CSN: [id number] Arrival date & time: 12/09/20  1026     History Chief Complaint  Patient presents with  . Shortness of Breath  . Congestive Heart Failure  . Asthma    Victor Thompson is a 60 y.o. male.  HPI    Patient with significant medical history of CAD, congestive heart failure, asthma presents to the emergency department with chief complaint of shortness of breath.  Patient states symptoms started on December 30 and have gotten worse.  He states he has worsening short of breath on exertion, orthopnea but denies chest pain, becoming diaphoretic, nausea or vomiting.  He states that he has a history of congestive heart failure feels as if he has fluid around his lungs.  He denies associated pedal edema and has been taking his medications as prescribed.  He states he is vaccine against COVID-19 and denies  recent sick contacts.  He denies  alleviating factors at this time.  Patient denies headaches, fevers, chills, shortness of breath, chest pain, abdominal pain, nausea, vomiting, diarrhea, pedal edema.  Past Medical History:  Diagnosis Date  . AICD (automatic cardioverter/defibrillator) present    2015  . Asthma    no meds  . Cardiac arrest ([place]) 2015  . CHF (congestive heart failure) ([place])   . Chronic kidney disease    ckd -stage 5  . Coronary artery disease   . Hypertension   . Myocardial infarction ([place])   . Obesity   . Pneumonia   . Pulmonary edema   . Shortness of breath dyspnea   . Sleep apnea    USES CPAP  . Wears glasses     Patient Active Problem List   Diagnosis Date Noted  . Angioedema 09/22/2020  . Acute on chronic combined systolic and diastolic CHF (congestive heart failure) ([place]) 06/14/2020  . Secondary hyperparathyroidism of renal origin ([place]) 07/15/2019  . Vitamin D deficiency 07/15/2019  . CAD (coronary artery disease), native coronary artery 04/10/2019  . CKD (chronic kidney  disease) stage 5, GFR less than 15 ml/min ([facility]) 09/26/2018  . Chronic midline low back pain without sciatica 02/17/2018  . VT (ventricular tachycardia) ([place]) 03/29/2016  . Defibrillator discharge   . Chronic combined systolic and diastolic CHF (congestive heart failure) ([place]) 01/22/2016  . Erectile dysfunction 01/22/2016  . Allergic rhinitis 01/22/2016  . GERD (gastroesophageal reflux disease) 01/22/2016  . PROTEINURIA 02/17/2010  . Cardiomyopathy, ischemic 02/16/2010  . Obstructive sleep apnea 02/16/2010  . EXTERNAL HEMORRHOIDS 09/26/2009  . Tobacco use disorder in remission 06/03/2009  . Essential hypertension 10/01/2008  . Hyperlipidemia 07/14/2007  . Class 3 severe obesity due to excess calories with serious comorbidity and body mass index (BMI) of 40.0 to 44.9 in adult ([place]) 07/14/2007  . Asthma 07/14/2007    Past Surgical History:  Procedure Laterality Date  . AV FISTULA PLACEMENT Right 03/15/2019   Procedure: RIGHT ARM ARTERIOVENOUS (AV) FISTULA CREATION;  Surgeon: Angelia Mould, MD;  Location: [facility];  Service: Vascular;  Laterality: Right;  . COLONOSCOPY    . COLONOSCOPY W/ BIOPSIES AND POLYPECTOMY    . CORONARY STENT PLACEMENT  2007  . IMPLANTABLE CARDIOVERTER DEFIBRILLATOR IMPLANT  2015  . SUBQ ICD CHANGEOUT N/A 10/13/2020   Procedure: SUBQ ICD CHANGEOUT;  Surgeon: Evans Lance, MD;  Location: [facility];  Service: Cardiovascular;  Laterality: N/A;       Family History  Problem Relation Age of Onset  .  Hypertension Mother   . Liver disease Father     Social History   Tobacco Use  . Smoking status: Never Smoker  . Smokeless tobacco: Never Used  Vaping Use  . Vaping Use: Never used  Substance Use Topics  . Alcohol use: No  . Drug use: No    Home Medications Prior to Admission medications   Medication Sig Start Date End Date Taking? Authorizing Provider  albuterol (PROVENTIL) (2.5 MG/3ML) 0.083% nebulizer solution TAKE 3 MLS (2.5 MG TOTAL)  BY NEBULIZATION EVERY 6 (SIX) HOURS AS NEEDED FOR WHEEZING OR SHORTNESS OF BREATH. 12/08/20   [name], MD  albuterol (VENTOLIN HFA) 108 (90 Base) MCG/ACT inhaler Inhale 2 puffs into the lungs every 4 (four) hours as needed for wheezing or shortness of breath. 10/01/19   [name], MD  amLODipine (NORVASC) 10 MG tablet Take 1 tablet (10 mg total) by mouth daily. 03/24/20   [name], MD  aspirin EC 81 MG tablet Take 1 tablet (81 mg total) by mouth daily. 01/22/16   [name], [name], MD  atorvastatin (LIPITOR) 40 MG tablet TAKE 1 TABLET (40 MG TOTAL) BY MOUTH AT BEDTIME. 11/07/20   [name], MD  budesonide-formoterol [facility]) 80-4.5 MCG/ACT inhaler Inhale 2 puffs into the lungs 2 (two) times daily. 06/04/20   [name], MD  calcitRIOL (ROCALTROL) 0.5 MCG capsule Take 0.5 mcg by mouth daily.  08/16/18   [provider]  carvedilol (COREG) 25 MG tablet TAKE 1 TABLET (25 MG TOTAL) BY MOUTH 2 (TWO) TIMES DAILY WITH A MEAL. 11/24/20   [name], MD  cetirizine (ZYRTEC ALLERGY) 10 MG tablet Take 1 tablet (10 mg total) by mouth at bedtime. 09/23/20 10/23/20  Para Skeans, MD  Cholecalciferol (VITAMIN D-3) 125 MCG (5000 UT) TABS Take 5,000 Units by mouth daily.     [provider]  EPINEPHrine 0.3 mg/0.3 mL IJ SOAJ injection Inject 0.3 mLs (0.3 mg total) into the muscle once. 08/14/16 08/14/16  [name], DO  EPINEPHrine 0.3 mg/0.3 mL IJ SOAJ injection Inject 0.3 mLs (0.3 mg total) into the muscle once. 04/22/17 04/22/17  [name], [name], MD  EPINEPHrine 0.3 mg/0.3 mL IJ SOAJ injection Inject 0.3 mLs (0.3 mg total) into the muscle once as needed for anaphylaxis. 03/24/20   [name], MD  EPINEPHrine 0.3 mg/0.3 mL IJ SOAJ injection Inject 0.3 mg into the muscle once as needed for up to 1 dose (if worsening tongue swelling, SOB, hypoxia, or other concerns for progressive anaphylaxis). 09/23/20   Para Skeans, MD  fluticasone (FLONASE) 50  MCG/ACT nasal spray Place 1 spray into both nostrils daily. 12/31/18   Volanda Napoleon, PA-C  hydrALAZINE (APRESOLINE) 10 MG tablet Take 20 mg by mouth 2 (two) times daily. 07/30/20   [provider]  hydrALAZINE (APRESOLINE) 25 MG tablet Take 1 tablet (25 mg total) by mouth 3 (three) times daily. 06/15/20 09/22/29  ShahmehdiValeria Batman, MD  isosorbide mononitrate (IMDUR) 30 MG 24 hr tablet TAKE 1 TABLET (30 MG TOTAL) BY MOUTH DAILY. 10/24/20   [name], MD  predniSONE (DELTASONE) 10 MG tablet Take 1 tablet (10 mg total) by mouth daily for 5 days. 12/09/20 12/14/20  Marcello Fennel, PA-C  torsemide (DEMADEX) 20 MG tablet TAKE 2 TABLETS (40 MG TOTAL) BY MOUTH DAILY. 10/24/20   [name], MD    Allergies    Ace inhibitors, Influenza vaccines, Ketorolac, Lidocaine, Lisinopril, and Penicillins  Review of Systems   Review of Systems  Constitutional: Negative for chills and fever.  HENT: Negative for congestion.   Respiratory: Positive for shortness of breath.   Cardiovascular: Negative for chest pain.  Gastrointestinal: Negative for abdominal pain, constipation, diarrhea and vomiting.  Genitourinary: Negative for enuresis.  Musculoskeletal: Negative for back pain.  Skin: Negative for rash.  Neurological: Negative for dizziness.  Hematological: Does not bruise/bleed easily.    Physical Exam Updated Vital Signs BP 131/61   Pulse 79   Temp 98.3 F (36.8 C) (Oral)   Resp (!) 23   Ht 5\' 7"  (1.702 m)   Wt 120.7 kg   SpO2 97%   BMI 41.66 kg/m   Physical Exam Vitals and nursing note reviewed.  Constitutional:      General: He is not in acute distress.    Appearance: He is not ill-appearing.  HENT:     Head: Normocephalic and atraumatic.     Nose: No congestion.     Mouth/Throat:     Mouth: Mucous membranes are moist.     Pharynx: Oropharynx is clear. No oropharyngeal exudate or posterior oropharyngeal erythema.  Eyes:     Conjunctiva/sclera: Conjunctivae  normal.  Cardiovascular:     Rate and Rhythm: Normal rate and regular rhythm.     Pulses: Normal pulses.     Heart sounds: No murmur heard. No friction rub. No gallop.   Pulmonary:     Effort: No respiratory distress.     Breath sounds: Wheezing present. No rhonchi or rales.     Comments: Patient not in acute distress, satting at mid to upper 90s on room air, able to talk in full sentences without difficulty.  Patient had some tight sounding chest, intermittent wheezing, no rales, rhonchi or other abnormalities noted. Chest:     Chest wall: Tenderness present.  Abdominal:     Palpations: Abdomen is soft.     Tenderness: There is no abdominal tenderness.  Musculoskeletal:     Right lower leg: No edema.     Left lower leg: No edema.  Skin:    General: Skin is warm and dry.  Neurological:     Mental Status: He is alert.  Psychiatric:        Mood and Affect: Mood normal.     ED Results / Procedures / Treatments   Labs (all labs ordered are listed, but only abnormal results are displayed) Labs Reviewed  RESP PANEL BY RT-PCR (RSV, FLU A&B, COVID)  RVPGX2 - Abnormal; Notable for the following components:      Result Value   SARS Coronavirus 2 by RT PCR POSITIVE (*)    All other components within normal limits  BASIC METABOLIC PANEL - Abnormal; Notable for the following components:   CO2 17 (*)    Glucose, Bld 101 (*)    BUN 75 (*)    Creatinine, Ser 10.63 (*)    Calcium 8.2 (*)    GFR, Estimated 5 (*)    Anion gap 16 (*)    All other components within normal limits  CBC - Abnormal; Notable for the following components:   RBC 3.52 (*)    Hemoglobin 10.8 (*)    HCT 33.0 (*)    RDW 16.3 (*)    All other components within normal limits  BRAIN NATRIURETIC PEPTIDE - Abnormal; Notable for the following components:   B Natriuretic Peptide 825.1 (*)    All other components within normal limits  TROPONIN I (HIGH  SENSITIVITY) - Abnormal; Notable for the following components:    Troponin I (High Sensitivity) 56 (*)    All other components within normal limits  TROPONIN I (HIGH SENSITIVITY)    EKG EKG Interpretation  Date/Time:  Tuesday December 09 2020 10:28:24 EST Ventricular Rate:  83 PR Interval:  184 QRS Duration: 142 QT Interval:  426 QTC Calculation: 500 R Axis:   -58 Text Interpretation: Sinus rhythm with occasional Premature ventricular complexes and Fusion complexes Left axis deviation Left bundle branch block Abnormal ECG No significant change since last tracing Confirmed by [name] [id number]) on 12/09/2020 4:06:53 PM   Radiology DG Chest 2 View  Result Date: 12/09/2020 CLINICAL DATA:  Shortness of breath EXAM: CHEST - 2 VIEW COMPARISON:  June 15, 2019 FINDINGS: No edema or airspace opacity. Stable cardiomegaly with pulmonary vascularity within normal limits. Apparent defibrillator device on the left with lead tip anterior to the sternomanubrial junction, stable. No adenopathy. There is degenerative change in the thoracic spine. IMPRESSION: Stable cardiomegaly. Stable positioning of defibrillator device. No edema or airspace opacity. Electronically Signed   By: Lowella Grip III M.D.   On: 12/09/2020 11:12    Procedures Procedures (including critical care time)  Medications Ordered in ED Medications  albuterol (VENTOLIN HFA) 108 (90 Base) MCG/ACT inhaler 2 puff (2 puffs Inhalation Given 12/09/20 1644)    ED Course  I have reviewed the triage vital signs and the nursing notes.  Pertinent labs & imaging results that were available during my care of the patient were reviewed by me and considered in my medical decision making (see chart for details).    MDM Rules/Calculators/A&P                          Patient presents with shortness of breath.  He is alert, does not appear to be distress, vital signs reassuring.  Will obtain basic lab work-up and reevaluate.  Patient is reassessed, states she feels unchanged, signs have remained stable  patient has no other complaints.  CBC negative for leukocytosis, shows normocytic anemia appears to be at baseline for patient.  BMP shows no Electra abnormalities, shows metabolic acidosis with CO2 of 17, hyperglycemia 101, BUN of 75, creatinine 10.6, GFR 5 appears to be at baseline for patient.  Anion gap 16.  Bnp 825.1, baseline for patient.  Initial troponin 56 at baseline for patient.  Chest x-ray shows no edema or airspace opacities.  EKG shows sinus rhythm without signs of ischemia no ST elevation depression noted.  I have low suspicion for ACS as history is atypicalEKG was sinus rhythm without signs of ischemia.  Patient had initial troponin of 56 which is at baseline for patient, second opponent was deferred at this time as he has had no chest pain in over 12 hours.  Low suspicion for CHF exacerbation as BNP appears to be at baseline, there is no noted fluid in lungs on x-ray.  Low suspicion for PE as patient denies pleuritic chest pain, patient denies leg pain, no pedal edema noted on exam, vital signs are reassuring.  Low suspicion for systemic infection as patient is nontoxic-appearing, vital signs reassuring, no obvious source infection noted on exam.  Low suspicion for pneumonia as lung sounds are clear bilaterally, x-ray did not reveal any acute findings. low suspicion for strep throat as oropharynx was visualized, no erythema or exudates noted.  Low suspicion patient would need  hospitalized due to Covid as vital signs  reassuring, patient is not in respiratory distress.  Suspect patient suffering from Covid causing his symptoms.  Will provide him with inhaler and steroids.  Infusion clinic was notified palpation and very will schedule him for an appointment.  Vital signs have remained stable, no indication for hospital admission.  Patient discussed with attending and they agreed with assessment and plan.  Patient given at home care as well strict return precautions.  Patient verbalized that  they understood agreed to said plan.  Final Clinical Impression(s) / ED Diagnoses Final diagnoses:  COVID    Rx / DC Orders ED Discharge Orders         Ordered    predniSONE (DELTASONE) 20 MG tablet  Daily,   Status:  Discontinued        12/09/20 1631    predniSONE (DELTASONE) 10 MG tablet  Daily        12/09/20 1632           Marcello Fennel, PA-C 12/09/20 [place], [place], DO 12/09/20 1818

## 2020-12-10 ENCOUNTER — Telehealth: Payer: Self-pay | Admitting: Infectious Diseases

## 2020-12-10 ENCOUNTER — Other Ambulatory Visit: Payer: Self-pay | Admitting: Infectious Diseases

## 2020-12-10 DIAGNOSIS — U071 COVID-19: Secondary | ICD-10-CM

## 2020-12-10 NOTE — Progress Notes (Signed)
I connected by phone with Victor Thompson on 12/10/2020 at 2:15 PM to discuss the potential use of a new treatment for mild to moderate COVID-19 viral infection in non-hospitalized patients.  This patient is a 60 y.o. male that meets the FDA criteria for Emergency Use Authorization of COVID monoclonal antibody casirivimab/imdevimab, bamlanivimab/etesevimab, or sotrovimab.  Has a (+) direct SARS-CoV-2 viral test result  Has mild or moderate COVID-19   Is NOT hospitalized due to COVID-19  Is within 10 days of symptom onset  Has at least one of the high risk factor(s) for progression to severe COVID-19 and/or hospitalization as defined in EUA.  Specific high risk criteria : BMI > 25, Cardiovascular disease or hypertension and Chronic Lung Disease   I have spoken and communicated the following to the patient or parent/caregiver regarding COVID monoclonal antibody treatment:  1. FDA has authorized the emergency use for the treatment of mild to moderate COVID-19 in adults and pediatric patients with positive results of direct SARS-CoV-2 viral testing who are 26 years of age and older weighing at least 40 kg, and who are at high risk for progressing to severe COVID-19 and/or hospitalization.  2. The significant known and potential risks and benefits of COVID monoclonal antibody, and the extent to which such potential risks and benefits are unknown.  3. Information on available alternative treatments and the risks and benefits of those alternatives, including clinical trials.  4. Patients treated with COVID monoclonal antibody should continue to self-isolate and use infection control measures (e.g., wear mask, isolate, social distance, avoid sharing personal items, clean and disinfect "high touch" surfaces, and frequent handwashing) according to CDC guidelines.   5. The patient or parent/caregiver has the option to accept or refuse COVID monoclonal antibody treatment.  After reviewing this  information with the patient, the patient has agreed to receive one of the available covid 19 monoclonal antibodies and will be provided an appropriate fact sheet prior to infusion. Janene Madeira, NP 12/10/2020 2:15 PM

## 2020-12-10 NOTE — Telephone Encounter (Signed)
Called to discuss with patient about COVID-19 symptoms and the use of one of the available treatments for those with mild to moderate Covid symptoms and at a high risk of hospitalization.  Pt appears to qualify for outpatient treatment due to co-morbid conditions and/or a member of an at-risk group in accordance with the FDA Emergency Use Authorization.    Symptom onset: 12/30 Vaccinated: primary vaccine Pfizer last dose April 2021  No booster Qualifiers: Obesity, CAD, CHF, [place], SVI score 3, NIH Tier 1   Scheduled for sotrovimab 12/11/2020   Janene Madeira, NP

## 2020-12-11 ENCOUNTER — Encounter (HOSPITAL_COMMUNITY): Payer: 59 | Admitting: Cardiology

## 2020-12-11 ENCOUNTER — Ambulatory Visit (HOSPITAL_COMMUNITY)
Admission: RE | Admit: 2020-12-11 | Discharge: 2020-12-11 | Disposition: A | Discharge status: Home / Self Care | Payer: HRSA Program | Source: Ambulatory Visit | Attending: Pulmonary Disease | Admitting: Pulmonary Disease

## 2020-12-11 DIAGNOSIS — U071 COVID-19: Secondary | ICD-10-CM | POA: Insufficient documentation

## 2020-12-11 MED ORDER — ALBUTEROL SULFATE HFA 108 (90 BASE) MCG/ACT IN AERS: 2.0000 | INHALATION_SPRAY | Freq: Once | RESPIRATORY_TRACT | Status: DC | PRN

## 2020-12-11 MED ORDER — DIPHENHYDRAMINE HCL 50 MG/ML IJ SOLN: 50.0000 mg | Freq: Once | INTRAMUSCULAR | Status: DC | PRN

## 2020-12-11 MED ORDER — FAMOTIDINE IN NACL 20-0.9 MG/50ML-% IV SOLN: 20.0000 mg | Freq: Once | INTRAVENOUS | Status: DC | PRN

## 2020-12-11 MED ORDER — SOTROVIMAB 500 MG/8ML IV SOLN
500.0000 mg | Freq: Once | INTRAVENOUS | Status: AC
  Administered 2020-12-11: 500 mg via INTRAVENOUS

## 2020-12-11 MED ORDER — SODIUM CHLORIDE 0.9 % IV SOLN: INTRAVENOUS | Status: DC | PRN

## 2020-12-11 MED ORDER — METHYLPREDNISOLONE SODIUM SUCC 125 MG IJ SOLR: 125.0000 mg | Freq: Once | INTRAMUSCULAR | Status: DC | PRN

## 2020-12-11 MED ORDER — EPINEPHRINE 0.3 MG/0.3ML IJ SOAJ: 0.3000 mg | Freq: Once | INTRAMUSCULAR | Status: DC | PRN

## 2020-12-11 NOTE — Progress Notes (Signed)
Patient reviewed Fact Sheet for Patients, Parents, and Caregivers for Emergency Use Authorization (EUA) of sotrovimab for the Treatment of Coronavirus. Patient also reviewed and is agreeable to the estimated cost of treatment. Patient is agreeable to proceed.   

## 2020-12-11 NOTE — Discharge Instructions (Signed)
10 Things You Can Do to Manage Your COVID-19 Symptoms at Home If you have possible or confirmed COVID-19: 1. Stay home from work and school. And stay away from other public places. If you must go out, avoid using any kind of public transportation, ridesharing, or taxis. 2. Monitor your symptoms carefully. If your symptoms get worse, call your healthcare provider immediately. 3. Get rest and stay hydrated. 4. If you have a medical appointment, call the healthcare provider ahead of time and tell them that you have or may have COVID-19. 5. For medical emergencies, call 911 and notify the dispatch personnel that you have or may have COVID-19. 6. Cover your cough and sneezes with a tissue or use the inside of your elbow. 7. Wash your hands often with soap and water for at least 20 seconds or clean your hands with an alcohol-based hand sanitizer that contains at least 60% alcohol. 8. As much as possible, stay in a specific room and away from other people in your home. Also, you should use a separate bathroom, if available. If you need to be around other people in or outside of the home, wear a mask. 9. Avoid sharing personal items with other people in your household, like dishes, towels, and bedding. 10. Clean all surfaces that are touched often, like counters, tabletops, and doorknobs. Use household cleaning sprays or wipes according to the label instructions. cdc.gov/coronavirus 06/06/2019 This information is not intended to replace advice given to you by your health care provider. Make sure you discuss any questions you have with your health care provider. Document Revised: 11/08/2019 Document Reviewed: 11/08/2019 Elsevier Patient Education  2020 Elsevier Inc. What types of side effects do monoclonal antibody drugs cause?  Common side effects  In general, the more common side effects caused by monoclonal antibody drugs include: . Allergic reactions, such as hives or itching . Flu-like signs and  symptoms, including chills, fatigue, fever, and muscle aches and pains . Nausea, vomiting . Diarrhea . Skin rashes . Low blood pressure   The CDC is recommending patients who receive monoclonal antibody treatments wait at least 90 days before being vaccinated.  Currently, there are no data on the safety and efficacy of mRNA COVID-19 vaccines in persons who received monoclonal antibodies or convalescent plasma as part of COVID-19 treatment. Based on the estimated half-life of such therapies as well as evidence suggesting that reinfection is uncommon in the 90 days after initial infection, vaccination should be deferred for at least 90 days, as a precautionary measure until additional information becomes available, to avoid interference of the antibody treatment with vaccine-induced immune responses. If you have any questions or concerns after the infusion please call the Advanced Practice Provider on call at [phone]. This number is ONLY intended for your use regarding questions or concerns about the infusion post-treatment side-effects.  Please do not provide this number to others for use. For return to work notes please contact your primary care provider.   If someone you know is interested in receiving treatment please have them call the COVID hotline at [phone].   

## 2020-12-11 NOTE — Progress Notes (Signed)
Diagnosis: COVID-19  Physician: Dr. Patrick Wright  Procedure: Covid Infusion Clinic Med: Sotrovimab infusion - Provided patient with sotrovimab fact sheet for patients, parents, and caregivers prior to infusion.   Complications: No immediate complications noted  Discharge: Discharged home    

## 2020-12-12 ENCOUNTER — Other Ambulatory Visit: Payer: Self-pay | Admitting: Internal Medicine

## 2020-12-12 DIAGNOSIS — I5042 Chronic combined systolic (congestive) and diastolic (congestive) heart failure: Secondary | ICD-10-CM

## 2020-12-15 ENCOUNTER — Other Ambulatory Visit: Payer: Self-pay

## 2020-12-15 ENCOUNTER — Ambulatory Visit: Payer: Self-pay | Attending: Internal Medicine

## 2020-12-15 ENCOUNTER — Other Ambulatory Visit: Payer: Self-pay | Admitting: Internal Medicine

## 2020-12-15 DIAGNOSIS — I5042 Chronic combined systolic (congestive) and diastolic (congestive) heart failure: Secondary | ICD-10-CM

## 2020-12-15 MED FILL — TORSEMIDE 20 MG TABLET: 20 | 30 days supply | Qty: 60 | Fill #0

## 2020-12-23 ENCOUNTER — Other Ambulatory Visit: Payer: Self-pay | Admitting: Internal Medicine

## 2020-12-23 ENCOUNTER — Other Ambulatory Visit: Payer: Self-pay | Admitting: Physician Assistant

## 2020-12-23 DIAGNOSIS — I10 Essential (primary) hypertension: Secondary | ICD-10-CM

## 2020-12-23 MED FILL — ATORVASTATIN CALCIUM 40 MG: 40 | 30 days supply | Qty: 30 | Fill #1

## 2020-12-23 MED FILL — AMLODIPINE BESYLATE 10 MG T: 10 | 30 days supply | Qty: 30 | Fill #0

## 2020-12-30 MED FILL — CALCITRIOL 0.5 MCG CAPS: 0.5 | 28 days supply | Qty: 12 | Fill #1

## 2021-01-07 ENCOUNTER — Other Ambulatory Visit: Payer: Self-pay

## 2021-01-07 ENCOUNTER — Encounter: Payer: Self-pay | Admitting: Physician Assistant

## 2021-01-07 ENCOUNTER — Other Ambulatory Visit: Payer: Self-pay | Admitting: Physician Assistant

## 2021-01-07 ENCOUNTER — Ambulatory Visit: Payer: Medicare Other | Attending: Physician Assistant | Admitting: Physician Assistant

## 2021-01-07 DIAGNOSIS — I10 Essential (primary) hypertension: Secondary | ICD-10-CM

## 2021-01-07 DIAGNOSIS — Z09 Encounter for follow-up examination after completed treatment for conditions other than malignant neoplasm: Secondary | ICD-10-CM

## 2021-01-07 DIAGNOSIS — I5042 Chronic combined systolic (congestive) and diastolic (congestive) heart failure: Secondary | ICD-10-CM | POA: Diagnosis not present

## 2021-01-07 DIAGNOSIS — I255 Ischemic cardiomyopathy: Secondary | ICD-10-CM | POA: Diagnosis not present

## 2021-01-07 DIAGNOSIS — U071 COVID-19: Secondary | ICD-10-CM | POA: Diagnosis not present

## 2021-01-07 MED ORDER — TORSEMIDE 20 MG PO TABS: ORAL_TABLET | ORAL | 1 refills | Status: DC

## 2021-01-07 MED ORDER — AMLODIPINE BESYLATE 10 MG PO TABS
10.0000 mg | ORAL_TABLET | Freq: Every day | ORAL | 3 refills | Status: DC
Start: 2021-01-07 — End: 2021-01-07

## 2021-01-07 NOTE — Progress Notes (Signed)
Patient ID: Victor Thompson, male   DOB: [birth year], 60 y.o.   MRN: [id number]   Virtual Visit via Telephone Note  I connected with Victor Thompson on 01/07/21 at  2:50 PM EST by telephone and verified that I am speaking with the correct person using two identifiers.  Location: Patient: home Provider: [facility] office Screened by Victor Thompson   I discussed the limitations, risks, security and privacy concerns of performing an evaluation and management service by telephone and the availability of in person appointments. I also discussed with the patient that there may be a patient responsible charge related to this service. The patient expressed understanding and agreed to proceed.   History of Present Illness: After being seen in ED 12/09/2020 with Covid 19 with infusion on 12/11/2020. Appetite is good.  No fevers.  covid has resolved.  Compliant on torsemide and still having SOB.  BNP=825.1 but has been elevated for years.  He was supposed to see cardiology in January but got covid infection and was rescheduled for march.  He denies CP but still having SOB.    HPI    Patient with significant medical history of CAD, congestive heart failure, asthma presents to the emergency department with chief complaint of shortness of breath.  Patient states symptoms started on December 30 and have gotten worse.  He states he has worsening short of breath on exertion, orthopnea but denies chest pain, becoming diaphoretic, nausea or vomiting.  He states that he has a history of congestive heart failure feels as if he has fluid around his lungs.  He denies associated pedal edema and has been taking his medications as prescribed.  He states he is vaccine against COVID-19 and denies  recent sick contacts.  He denies  alleviating factors at this time.  Patient denies headaches, fevers, chills, shortness of breath, chest pain, abdominal pain, nausea, vomiting, diarrhea, pedal edema.  From A/P: Patient presents with  shortness of breath.  He is alert, does not appear to be distress, vital signs reassuring.  Will obtain basic lab work-up and reevaluate.  Patient is reassessed, states she feels unchanged, signs have remained stable patient has no other complaints.  CBC negative for leukocytosis, shows normocytic anemia appears to be at baseline for patient.  BMP shows no Electra abnormalities, shows metabolic acidosis with CO2 of 17, hyperglycemia 101, BUN of 75, creatinine 10.6, GFR 5 appears to be at baseline for patient.  Anion gap 16.  Bnp 825.1, baseline for patient.  Initial troponin 56 at baseline for patient.  Chest x-ray shows no edema or airspace opacities.  EKG shows sinus rhythm without signs of ischemia no ST elevation depression noted.  I have low suspicion for ACS as history is atypicalEKG was sinus rhythm without signs of ischemia.  Patient had initial troponin of 56 which is at baseline for patient, second opponent was deferred at this time as he has had no chest pain in over 12 hours.  Low suspicion for CHF exacerbation as BNP appears to be at baseline, there is no noted fluid in lungs on x-ray.  Low suspicion for PE as patient denies pleuritic chest pain, patient denies leg pain, no pedal edema noted on exam, vital signs are reassuring.  Low suspicion for systemic infection as patient is nontoxic-appearing, vital signs reassuring, no obvious source infection noted on exam.  Low suspicion for pneumonia as lung sounds are clear bilaterally, x-ray did not reveal any acute findings. low suspicion for strep throat as oropharynx  was visualized, no erythema or exudates noted.  Low suspicion patient would need  hospitalized due to Covid as vital signs reassuring, patient is not in respiratory distress.  Suspect patient suffering from Covid causing his symptoms.  Will provide him with inhaler and steroids.  Infusion clinic was notified palpation and very will schedule him for an appointment.  Vital signs have  remained stable, no indication for hospital admission.  Patient discussed with attending and they agreed with assessment and plan.  Patient given at home care as well strict return precautions.  Patient verbalized that they understood agreed to said plan.    Observations/Objective: NAD.  A&Ox3  Assessment and Plan: 1. Chronic combined systolic and diastolic CHF (congestive heart failure) ([place]) wew will call and see if we can get him an appt sooner than March.  Victor Thompson will call patient back with sooner appt.  Patient instructed to go to ED if CP develops or SOB worsens.  Patient verbalizes understanding - torsemide (DEMADEX) 20 MG tablet; TAKE 2 TABLETS (40 MG TOTAL) BY MOUTH DAILY.  Dispense: 60 tablet; Refill: 1  2. Cardiomyopathy, ischemic Continue current med regimen  3. Essential hypertension Needs amlodipine RF - amLODipine (NORVASC) 10 MG tablet; Take 1 tablet (10 mg total) by mouth daily.  Dispense: 30 tablet; Refill: 3  4. COVID-19 virus infection resolved  5. Hospital discharge follow-up Much improved overall   Follow Up Instructions: See PCP in 2 months   I discussed the assessment and treatment plan with the patient. The patient was provided an opportunity to ask questions and all were answered. The patient agreed with the plan and demonstrated an understanding of the instructions.   The patient was advised to call back or seek an in-person evaluation if the symptoms worsen or if the condition fails to improve as anticipated.  I provided 18 minutes of non-face-to-face time during this encounter.   Victor Caldron, PA-C

## 2021-01-11 ENCOUNTER — Observation Stay (HOSPITAL_COMMUNITY)
Admission: EM | Admit: 2021-01-11 | Discharge: 2021-01-12 | DRG: 291 | Disposition: A | Discharge status: Home / Self Care | Payer: Medicaid Other | Attending: Internal Medicine | Admitting: Internal Medicine

## 2021-01-11 ENCOUNTER — Encounter (HOSPITAL_COMMUNITY): Payer: Self-pay | Admitting: Emergency Medicine

## 2021-01-11 ENCOUNTER — Emergency Department (HOSPITAL_COMMUNITY): Payer: Medicaid Other

## 2021-01-11 ENCOUNTER — Other Ambulatory Visit: Payer: Self-pay

## 2021-01-11 DIAGNOSIS — Z887 Allergy status to serum and vaccine status: Secondary | ICD-10-CM | POA: Diagnosis not present

## 2021-01-11 DIAGNOSIS — Z95810 Presence of automatic (implantable) cardiac defibrillator: Secondary | ICD-10-CM

## 2021-01-11 DIAGNOSIS — Z8249 Family history of ischemic heart disease and other diseases of the circulatory system: Secondary | ICD-10-CM | POA: Diagnosis not present

## 2021-01-11 DIAGNOSIS — Z884 Allergy status to anesthetic agent status: Secondary | ICD-10-CM | POA: Diagnosis not present

## 2021-01-11 DIAGNOSIS — I248 Other forms of acute ischemic heart disease: Secondary | ICD-10-CM | POA: Diagnosis not present

## 2021-01-11 DIAGNOSIS — Z955 Presence of coronary angioplasty implant and graft: Secondary | ICD-10-CM

## 2021-01-11 DIAGNOSIS — Z8674 Personal history of sudden cardiac arrest: Secondary | ICD-10-CM

## 2021-01-11 DIAGNOSIS — G4733 Obstructive sleep apnea (adult) (pediatric): Secondary | ICD-10-CM | POA: Diagnosis not present

## 2021-01-11 DIAGNOSIS — Z79899 Other long term (current) drug therapy: Secondary | ICD-10-CM

## 2021-01-11 DIAGNOSIS — R0902 Hypoxemia: Secondary | ICD-10-CM | POA: Diagnosis present

## 2021-01-11 DIAGNOSIS — I5043 Acute on chronic combined systolic (congestive) and diastolic (congestive) heart failure: Secondary | ICD-10-CM | POA: Diagnosis present

## 2021-01-11 DIAGNOSIS — I132 Hypertensive heart and chronic kidney disease with heart failure and with stage 5 chronic kidney disease, or end stage renal disease: Principal | ICD-10-CM | POA: Diagnosis present

## 2021-01-11 DIAGNOSIS — Z880 Allergy status to penicillin: Secondary | ICD-10-CM

## 2021-01-11 DIAGNOSIS — E8770 Fluid overload, unspecified: Secondary | ICD-10-CM | POA: Diagnosis present

## 2021-01-11 DIAGNOSIS — Z7951 Long term (current) use of inhaled steroids: Secondary | ICD-10-CM

## 2021-01-11 DIAGNOSIS — I252 Old myocardial infarction: Secondary | ICD-10-CM | POA: Diagnosis not present

## 2021-01-11 DIAGNOSIS — Z888 Allergy status to other drugs, medicaments and biological substances status: Secondary | ICD-10-CM

## 2021-01-11 DIAGNOSIS — I2510 Atherosclerotic heart disease of native coronary artery without angina pectoris: Secondary | ICD-10-CM | POA: Diagnosis present

## 2021-01-11 DIAGNOSIS — Z6841 Body Mass Index (BMI) 40.0 and over, adult: Secondary | ICD-10-CM | POA: Diagnosis not present

## 2021-01-11 DIAGNOSIS — N186 End stage renal disease: Secondary | ICD-10-CM | POA: Diagnosis present

## 2021-01-11 DIAGNOSIS — Z7982 Long term (current) use of aspirin: Secondary | ICD-10-CM | POA: Diagnosis not present

## 2021-01-11 DIAGNOSIS — I5042 Chronic combined systolic (congestive) and diastolic (congestive) heart failure: Secondary | ICD-10-CM

## 2021-01-11 DIAGNOSIS — N179 Acute kidney failure, unspecified: Secondary | ICD-10-CM

## 2021-01-11 DIAGNOSIS — R079 Chest pain, unspecified: Secondary | ICD-10-CM | POA: Diagnosis present

## 2021-01-11 DIAGNOSIS — Z8616 Personal history of COVID-19: Secondary | ICD-10-CM

## 2021-01-11 DIAGNOSIS — N289 Disorder of kidney and ureter, unspecified: Secondary | ICD-10-CM

## 2021-01-11 LAB — BASIC METABOLIC PANEL
Anion gap: 16 — ABNORMAL HIGH (ref 5–15)
BUN: 81 mg/dL — ABNORMAL HIGH (ref 6–20)
CO2: 18 mmol/L — ABNORMAL LOW (ref 22–32)
Calcium: 8.6 mg/dL — ABNORMAL LOW (ref 8.9–10.3)
Chloride: 108 mmol/L (ref 98–111)
Creatinine, Ser: 12.9 mg/dL — ABNORMAL HIGH (ref 0.61–1.24)
GFR, Estimated: 4 mL/min — ABNORMAL LOW (ref >60)
Glucose, Bld: 124 mg/dL — ABNORMAL HIGH (ref 70–99)
Potassium: 4.2 mmol/L (ref 3.5–5.1)
Sodium: 142 mmol/L (ref 135–145)

## 2021-01-11 LAB — CBC
HCT: 35.3 % — ABNORMAL LOW (ref 39.0–52.0)
HCT: 34.7 % — ABNORMAL LOW (ref 39.0–52.0)
Hemoglobin: 11.2 g/dL — ABNORMAL LOW (ref 13.0–17.0)
Hemoglobin: 11 g/dL — ABNORMAL LOW (ref 13.0–17.0)
MCH: 29.6 pg (ref 26.0–34.0)
MCH: 29.1 pg (ref 26.0–34.0)
MCHC: 31.7 g/dL (ref 30.0–36.0)
MCHC: 31.7 g/dL (ref 30.0–36.0)
MCV: 93.4 fL (ref 80.0–100.0)
MCV: 91.8 fL (ref 80.0–100.0)
Platelets: 208 10*3/uL (ref 150–400)
Platelets: 214 10*3/uL (ref 150–400)
RBC: 3.78 MIL/uL — ABNORMAL LOW (ref 4.22–5.81)
RBC: 3.78 MIL/uL — ABNORMAL LOW (ref 4.22–5.81)
RDW: 15.7 % — ABNORMAL HIGH (ref 11.5–15.5)
RDW: 15.7 % — ABNORMAL HIGH (ref 11.5–15.5)
WBC: 7.2 10*3/uL (ref 4.0–10.5)
WBC: 5.6 10*3/uL (ref 4.0–10.5)
nRBC: 0 % (ref 0.0–0.2)
nRBC: 0 % (ref 0.0–0.2)

## 2021-01-11 LAB — TROPONIN I (HIGH SENSITIVITY)
Troponin I (High Sensitivity): 156 ng/L (ref <18)
Troponin I (High Sensitivity): 145 ng/L (ref <18)

## 2021-01-11 LAB — BRAIN NATRIURETIC PEPTIDE: B Natriuretic Peptide: 870.8 pg/mL — ABNORMAL HIGH (ref 0.0–100.0)

## 2021-01-11 LAB — CREATININE, SERUM
Creatinine, Ser: 13.01 mg/dL — ABNORMAL HIGH (ref 0.61–1.24)
GFR, Estimated: 4 mL/min — ABNORMAL LOW (ref >60)

## 2021-01-11 MED ORDER — CARVEDILOL 3.125 MG PO TABS
25.0000 mg | ORAL_TABLET | Freq: Two times a day (BID) | ORAL | Status: DC
  Administered 2021-01-12 (×2): 25 mg via ORAL
  Filled 2021-01-11 (×2): qty 8

## 2021-01-11 MED ORDER — HYDRALAZINE HCL 10 MG PO TABS
20.0000 mg | ORAL_TABLET | Freq: Two times a day (BID) | ORAL | Status: DC
  Administered 2021-01-11 – 2021-01-12 (×2): 20 mg via ORAL
  Filled 2021-01-11 (×2): qty 2

## 2021-01-11 MED ORDER — ASPIRIN EC 81 MG PO TBEC
81.0000 mg | DELAYED_RELEASE_TABLET | Freq: Every day | ORAL | Status: DC
  Administered 2021-01-12: 81 mg via ORAL
  Filled 2021-01-11: qty 1

## 2021-01-11 MED ORDER — MOMETASONE FURO-FORMOTEROL FUM 100-5 MCG/ACT IN AERO
2.0000 | INHALATION_SPRAY | Freq: Two times a day (BID) | RESPIRATORY_TRACT | Status: DC
  Filled 2021-01-11: qty 8.8

## 2021-01-11 MED ORDER — HEPARIN SODIUM (PORCINE) 5000 UNIT/ML IJ SOLN
5000.0000 [IU] | Freq: Three times a day (TID) | INTRAMUSCULAR | Status: DC
  Filled 2021-01-11 (×3): qty 1

## 2021-01-11 MED ORDER — VITAMIN D 25 MCG (1000 UNIT) PO TABS: 5000.0000 [IU] | ORAL_TABLET | Freq: Every day | ORAL | Status: DC

## 2021-01-11 MED ORDER — ACETAMINOPHEN 325 MG PO TABS: 650.0000 mg | ORAL_TABLET | ORAL | Status: DC | PRN

## 2021-01-11 MED ORDER — ATORVASTATIN CALCIUM 10 MG PO TABS
40.0000 mg | ORAL_TABLET | Freq: Every day | ORAL | Status: DC
  Administered 2021-01-11: 40 mg via ORAL
  Filled 2021-01-11: qty 4

## 2021-01-11 MED ORDER — FUROSEMIDE 10 MG/ML IJ SOLN
80.0000 mg | Freq: Once | INTRAMUSCULAR | Status: AC
  Administered 2021-01-11: 80 mg via INTRAVENOUS
  Filled 2021-01-11: qty 8

## 2021-01-11 MED ORDER — AMLODIPINE BESYLATE 5 MG PO TABS
10.0000 mg | ORAL_TABLET | Freq: Every day | ORAL | Status: DC
  Administered 2021-01-12: 10 mg via ORAL
  Filled 2021-01-11: qty 2

## 2021-01-11 MED ORDER — CALCITRIOL 0.5 MCG PO CAPS
0.5000 ug | ORAL_CAPSULE | Freq: Every day | ORAL | Status: DC
  Administered 2021-01-12: 0.5 ug via ORAL
  Filled 2021-01-11: qty 1

## 2021-01-11 MED ORDER — TORSEMIDE 20 MG PO TABS
20.0000 mg | ORAL_TABLET | Freq: Every day | ORAL | Status: DC
  Administered 2021-01-12: 20 mg via ORAL
  Filled 2021-01-11: qty 1

## 2021-01-11 MED ORDER — ISOSORBIDE MONONITRATE ER 30 MG PO TB24
30.0000 mg | ORAL_TABLET | Freq: Every day | ORAL | Status: DC
  Administered 2021-01-12: 30 mg via ORAL
  Filled 2021-01-11: qty 1

## 2021-01-11 NOTE — H&P (Signed)
NAME:  Victor Thompson, MRN:  [id number], DOB:  [birth year], LOS: 0 ADMISSION DATE:  01/11/2021, Primary: Victor Pier, MD  CHIEF COMPLAINT:  Chest pain  Medical Service: Internal Medicine Teaching Service         Attending Physician: Dr. Aldine Contes, MD    First Contact: Dr. Lisabeth Devoid Pager: [phone]  Second Contact: Dr. Marva Panda Pager: [phone]       After Hours (After 5p/  First Contact Pager: [phone]  weekends / holidays): Second Contact Pager: [phone]    History of present illness   15 yom with combined heart failure and ESRD (not on HD) who presented to [facility] on 01/11/21 for 2d history of substernal chest pain and shortness of breath.  Chest pain characterized as pressure and does not radiate. He denies pleuritic chest pain. Not positional. Present at rest and with activity. Denies aggravating or alleviating symptoms. Denies palpitations. He also notes 2d history of worsening shortness of breath. He is fairly limited in mobility due to this at baseline--able to walk around 100 ft or 3 steps before having to stop to rest. Last night, he was unable to sleep because he felt short of breath laying down. He typically sleeps on 2 pillows however this was not sufficient last night. He endorses a dry cough over this same period of time.  He denies any recent changes in volume or frequency of urination. No recent dietary indiscretions. No recent medication changes No recent illnesses. Denies fever, chills, n/v, diarrhea or constipation.   Past Medical History  He,  has a past medical history of AICD (automatic cardioverter/defibrillator) present, Asthma, Cardiac arrest ([place]) (2015), CHF (congestive heart failure) ([place]), Chronic kidney disease, Coronary artery disease, Hypertension, Myocardial infarction ([place]), Obesity, Pneumonia, Pulmonary edema, Shortness of breath dyspnea, Sleep apnea, and Wears glasses.   Home Medications     Prior to Admission medications   Medication Sig Start Date  End Date Taking? Authorizing Provider  albuterol (PROVENTIL) (2.5 MG/3ML) 0.083% nebulizer solution TAKE 3 MLS (2.5 MG TOTAL) BY NEBULIZATION EVERY 6 (SIX) HOURS AS NEEDED FOR WHEEZING OR SHORTNESS OF BREATH. 12/08/20  Yes Victor Pier, MD  albuterol (VENTOLIN HFA) 108 (90 Base) MCG/ACT inhaler Inhale 2 puffs into the lungs every 4 (four) hours as needed for wheezing or shortness of breath. 10/01/19  Yes Victor Pier, MD  amLODipine (NORVASC) 10 MG tablet Take 1 tablet (10 mg total) by mouth daily. 01/07/21  Yes Victor Donovan, PA-C  aspirin EC 81 MG tablet Take 1 tablet (81 mg total) by mouth daily. 01/22/16  Yes [name], [name], MD  atorvastatin (LIPITOR) 40 MG tablet TAKE 1 TABLET (40 MG TOTAL) BY MOUTH AT BEDTIME. 11/07/20  Yes Victor Pier, MD  budesonide-formoterol (SYMBICORT) 80-4.5 MCG/ACT inhaler Inhale 2 puffs into the lungs 2 (two) times daily. Patient taking differently: Inhale 2 puffs into the lungs as needed (wheezing). 06/04/20  Yes [name], MD  calcitRIOL (ROCALTROL) 0.5 MCG capsule Take 0.5 mcg by mouth daily.  08/16/18  Yes [provider]  carvedilol (COREG) 25 MG tablet TAKE 1 TABLET (25 MG TOTAL) BY MOUTH 2 (TWO) TIMES DAILY WITH A MEAL. 11/24/20  Yes Victor Pier, MD  cetirizine (ZYRTEC ALLERGY) 10 MG tablet Take 1 tablet (10 mg total) by mouth at bedtime. 09/23/20 10/23/20 Yes Para Skeans, MD  Cholecalciferol (VITAMIN D-3) 125 MCG (5000 UT) TABS Take 5,000 Units by mouth daily.    Yes [provider]  EPINEPHrine  0.3 mg/0.3 mL IJ SOAJ injection Inject 0.3 mg into the muscle once as needed for up to 1 dose (if worsening tongue swelling, SOB, hypoxia, or other concerns for progressive anaphylaxis). 09/23/20  Yes Para Skeans, MD  fluticasone (FLONASE) 50 MCG/ACT nasal spray Place 1 spray into both nostrils daily. Patient taking differently: Place 1 spray into both nostrils as needed. 12/31/18  Yes Providence Lanius A, PA-C   hydrALAZINE (APRESOLINE) 10 MG tablet Take 20 mg by mouth 2 (two) times daily. 07/30/20  Yes [provider]  isosorbide mononitrate (IMDUR) 30 MG 24 hr tablet TAKE 1 TABLET (30 MG TOTAL) BY MOUTH DAILY. 10/24/20  Yes Victor Pier, MD  torsemide (DEMADEX) 20 MG tablet TAKE 2 TABLETS (40 MG TOTAL) BY MOUTH DAILY. 01/07/21  Yes [name], PA-C  EPINEPHrine 0.3 mg/0.3 mL IJ SOAJ injection Inject 0.3 mLs (0.3 mg total) into the muscle once. 08/14/16 08/14/16  [name], DO  EPINEPHrine 0.3 mg/0.3 mL IJ SOAJ injection Inject 0.3 mLs (0.3 mg total) into the muscle once. 04/22/17 04/22/17  [name], [name], MD  EPINEPHrine 0.3 mg/0.3 mL IJ SOAJ injection Inject 0.3 mLs (0.3 mg total) into the muscle once as needed for anaphylaxis. Patient not taking: Reported on 01/11/2021 03/24/20   Victor Pier, MD  hydrALAZINE (APRESOLINE) 25 MG tablet Take 1 tablet (25 mg total) by mouth 3 (three) times daily. Patient not taking: Reported on 01/11/2021 06/15/20 09/22/29  Victor James, MD    Allergies    Allergies as of 01/11/2021 - Review Complete 01/11/2021  Allergen Reaction Noted  . Ace inhibitors Swelling 03/04/2016  . Influenza vaccines Hives and Swelling 09/13/2017  . Ketorolac Swelling 09/13/2017  . Lidocaine Swelling 04/09/2017  . Lisinopril Swelling 07/17/2007  . Penicillins Swelling 04/09/2017    Social History   reports that he has never smoked. He has never used smokeless tobacco. He reports that he does not drink alcohol and does not use drugs.   Family History   His family history includes Hypertension in his mother; Liver disease in his father.   ROS  Negative unless stated in HPI Objective   Blood pressure 127/84, pulse 91, temperature (!) 97.5 F (36.4 C), temperature source Oral, resp. rate 20, SpO2 94 %.      Examination: GENERAL: chronically ill appearing HEENT: head atraumatic. No conjunctival injection. Nares patent. JVD not appreciable CARDIAC: heart  RRR. Trace pitting LE edema PULMONARY: breathing comfortably on room air. Speaks complete sentences. Lungs clear bilaterally. ABDOMEN: soft. Nontender to palpation.  Nondistended.  NEURO: CN II-XII grossly intact. SKIN: no rash or lesions on limited exam  MSK: moves all extremities  Significant Diagnostic Tests:  CXR: no active cardiopulmonary disease  Labs    CBC Latest Ref Rng & Units 01/11/2021 12/09/2020 10/13/2020  WBC 4.0 - 10.5 K/uL 7.2 5.5 -  Hemoglobin 13.0 - 17.0 g/dL 11.2(L) 10.8(L) 10.9(L)  Hematocrit 39.0 - 52.0 % 35.3(L) 33.0(L) 32.0(L)  Platelets 150 - 400 K/uL 208 204 -   BMP Latest Ref Rng & Units 01/11/2021 12/09/2020 10/13/2020  Glucose 70 - 99 mg/dL 124(H) 101(H) 102(H)  BUN 6 - 20 mg/dL 81(H) 75(H) 81(H)  Creatinine 0.61 - 1.24 mg/dL 12.90(H) 10.63(H) 11.00(H)  BUN/Creat Ratio 9 - 20 - - -  Sodium 135 - 145 mmol/L 142 139 142  Potassium 3.5 - 5.1 mmol/L 4.2 3.7 3.9  Chloride 98 - 111 mmol/L 108 106 106  CO2 22 - 32 mmol/L 18(L) 17(L) -  Calcium 8.9 - 10.3 mg/dL 8.6(L) 8.2(L) -     Summary  59 yom with combined heart failure, ICM, ESRD (not on HD) who was admitted to [facility]MT[facility] for chest pain.  Assessment & Plan:  Active Problems:   Chest pain  Chest pain. HEART score 7. ACS ruled out--elevated trops likely related to demand. No significant EKG changes from prior.   ICM, combined heart failure. Last echo in 7/21 with EF 30-35% G2DD. He endorses orthopnea however he looks euvolemic on exam. No pulmonary edema or effusions on CXR Follows with Dr. Radford Pax and Dr. Rayann Heman CAD s/p PCI 2007 Hx of VF arrest s/p ICD ([employer]) replacement 11/21. Follows with Dr. Lovena Le in EP.  QRS <183msec on EKG. No recent shocks.  Hypertension Plan -continue home medications--amlodipine, coreg, hydralazine, imdur, toresemide, aspirin, statin -tele monitoring -strict I/O  ESRD not on HD. Right AVF in place. Follows at [facility] Renal function has declined further over the  past month. No significant electrolyte derangements or volume overload on admission. I do not think that his orthopnea is necessarily from being hypervolemic. We do not have a weight documented yet this admission, so unclear how this compares to prior weights. He does not know his dry weight. He could be having some pericarditis in the setting of uremia however his sx are not necessarily consistent with that either. Metabolic acidosis 2/2 renal disease On call nephrologist notified of admission.  Plan -given his relative euvolemic state and ESRD, I will just continue his home toresemide dose for now.  -will call back nephrology if something changes -continue calitriol  OSA. Continue CPAP. Hx of asthma. Continue symbicort. Chronic Anemia--stable. Best practice:  CODE STATUS: Full Diet: Renal DVT for prophylaxis: heparin Social considerations/Family communication: none Dispo: Admit patient to Inpatient with expected length of stay greater than 2 midnights. suspect he may be able to discharge in 1-2d if nephrology does not plan to initiate HD here   Mitzi Hansen, MD Internal Medicine Resident PGY-2 [facility] Pager: [phone] 01/11/2021 8:40 PM

## 2021-01-11 NOTE — ED Provider Notes (Signed)
[place] EMERGENCY DEPARTMENT Provider Note   CSN: [id number] Arrival date & time: 01/11/21  1113     History Chief Complaint  Patient presents with  . Shortness of Breath  . Chest Pain    Victor Thompson is a 60 y.o. male.  The history is provided by the patient. No language interpreter was used.  Shortness of Breath Severity:  Moderate Onset quality:  Gradual Timing:  Constant Progression:  Worsening Chronicity:  New Relieved by:  Nothing Worsened by:  Nothing Ineffective treatments:  None tried Associated symptoms: no cough and no fever   Pt has a history fo chf and kidney failure.  Pt complains of chest tightness and shortness of breath     Past Medical History:  Diagnosis Date  . AICD (automatic cardioverter/defibrillator) present    2015  . Asthma    no meds  . Cardiac arrest ([place]) 2015  . CHF (congestive heart failure) ([place])   . Chronic kidney disease    ckd -stage 5  . Coronary artery disease   . Hypertension   . Myocardial infarction ([place])   . Obesity   . Pneumonia   . Pulmonary edema   . Shortness of breath dyspnea   . Sleep apnea    USES CPAP  . Wears glasses     Patient Active Problem List   Diagnosis Date Noted  . Angioedema 09/22/2020  . Acute on chronic combined systolic and diastolic CHF (congestive heart failure) ([place]) 06/14/2020  . Secondary hyperparathyroidism of renal origin ([place]) 07/15/2019  . Vitamin D deficiency 07/15/2019  . CAD (coronary artery disease), native coronary artery 04/10/2019  . CKD (chronic kidney disease) stage 5, GFR less than 15 ml/min ([facility]) 09/26/2018  . Chronic midline low back pain without sciatica 02/17/2018  . VT (ventricular tachycardia) ([place]) 03/29/2016  . Defibrillator discharge   . Chronic combined systolic and diastolic CHF (congestive heart failure) ([place]) 01/22/2016  . Erectile dysfunction 01/22/2016  . Allergic rhinitis 01/22/2016  . GERD (gastroesophageal reflux disease)  01/22/2016  . PROTEINURIA 02/17/2010  . Cardiomyopathy, ischemic 02/16/2010  . Obstructive sleep apnea 02/16/2010  . EXTERNAL HEMORRHOIDS 09/26/2009  . Tobacco use disorder in remission 06/03/2009  . Essential hypertension 10/01/2008  . Hyperlipidemia 07/14/2007  . Class 3 severe obesity due to excess calories with serious comorbidity and body mass index (BMI) of 40.0 to 44.9 in adult ([place]) 07/14/2007  . Asthma 07/14/2007    Past Surgical History:  Procedure Laterality Date  . AV FISTULA PLACEMENT Right 03/15/2019   Procedure: RIGHT ARM ARTERIOVENOUS (AV) FISTULA CREATION;  Surgeon: Angelia Mould, MD;  Location: [facility];  Service: Vascular;  Laterality: Right;  . COLONOSCOPY    . COLONOSCOPY W/ BIOPSIES AND POLYPECTOMY    . CORONARY STENT PLACEMENT  2007  . IMPLANTABLE CARDIOVERTER DEFIBRILLATOR IMPLANT  2015  . SUBQ ICD CHANGEOUT N/A 10/13/2020   Procedure: SUBQ ICD CHANGEOUT;  Surgeon: Evans Lance, MD;  Location: [facility];  Service: Cardiovascular;  Laterality: N/A;       Family History  Problem Relation Age of Onset  . Hypertension Mother   . Liver disease Father     Social History   Tobacco Use  . Smoking status: Never Smoker  . Smokeless tobacco: Never Used  Vaping Use  . Vaping Use: Never used  Substance Use Topics  . Alcohol use: No  . Drug use: No    Home Medications Prior to Admission medications  Medication Sig Start Date End Date Taking? Authorizing Provider  albuterol (PROVENTIL) (2.5 MG/3ML) 0.083% nebulizer solution TAKE 3 MLS (2.5 MG TOTAL) BY NEBULIZATION EVERY 6 (SIX) HOURS AS NEEDED FOR WHEEZING OR SHORTNESS OF BREATH. 12/08/20  Yes [name], MD  albuterol (VENTOLIN HFA) 108 (90 Base) MCG/ACT inhaler Inhale 2 puffs into the lungs every 4 (four) hours as needed for wheezing or shortness of breath. 10/01/19  Yes [name], MD  amLODipine (NORVASC) 10 MG tablet Take 1 tablet (10 mg total) by mouth daily. 01/07/21  Yes  [name], PA-C  aspirin EC 81 MG tablet Take 1 tablet (81 mg total) by mouth daily. 01/22/16  Yes [name], [name], MD  atorvastatin (LIPITOR) 40 MG tablet TAKE 1 TABLET (40 MG TOTAL) BY MOUTH AT BEDTIME. 11/07/20  Yes [name], MD  budesonide-formoterol (SYMBICORT) 80-4.5 MCG/ACT inhaler Inhale 2 puffs into the lungs 2 (two) times daily. Patient taking differently: Inhale 2 puffs into the lungs as needed (wheezing). 06/04/20  Yes [name], MD  calcitRIOL (ROCALTROL) 0.5 MCG capsule Take 0.5 mcg by mouth daily.  08/16/18  Yes [provider]  carvedilol (COREG) 25 MG tablet TAKE 1 TABLET (25 MG TOTAL) BY MOUTH 2 (TWO) TIMES DAILY WITH A MEAL. 11/24/20  Yes [name], MD  cetirizine (ZYRTEC ALLERGY) 10 MG tablet Take 1 tablet (10 mg total) by mouth at bedtime. 09/23/20 10/23/20 Yes Para Skeans, MD  Cholecalciferol (VITAMIN D-3) 125 MCG (5000 UT) TABS Take 5,000 Units by mouth daily.    Yes [provider]  EPINEPHrine 0.3 mg/0.3 mL IJ SOAJ injection Inject 0.3 mg into the muscle once as needed for up to 1 dose (if worsening tongue swelling, SOB, hypoxia, or other concerns for progressive anaphylaxis). 09/23/20  Yes Para Skeans, MD  fluticasone (FLONASE) 50 MCG/ACT nasal spray Place 1 spray into both nostrils daily. Patient taking differently: Place 1 spray into both nostrils as needed. 12/31/18  Yes Providence Lanius A, PA-C  hydrALAZINE (APRESOLINE) 10 MG tablet Take 20 mg by mouth 2 (two) times daily. 07/30/20  Yes [provider]  isosorbide mononitrate (IMDUR) 30 MG 24 hr tablet TAKE 1 TABLET (30 MG TOTAL) BY MOUTH DAILY. 10/24/20  Yes [name], MD  torsemide (DEMADEX) 20 MG tablet TAKE 2 TABLETS (40 MG TOTAL) BY MOUTH DAILY. 01/07/21  Yes [name], PA-C  EPINEPHrine 0.3 mg/0.3 mL IJ SOAJ injection Inject 0.3 mLs (0.3 mg total) into the muscle once. 08/14/16 08/14/16  [name], DO  EPINEPHrine 0.3 mg/0.3 mL IJ SOAJ  injection Inject 0.3 mLs (0.3 mg total) into the muscle once. 04/22/17 04/22/17  [name], [name], MD  EPINEPHrine 0.3 mg/0.3 mL IJ SOAJ injection Inject 0.3 mLs (0.3 mg total) into the muscle once as needed for anaphylaxis. Patient not taking: Reported on 01/11/2021 03/24/20   [name], MD  hydrALAZINE (APRESOLINE) 25 MG tablet Take 1 tablet (25 mg total) by mouth 3 (three) times daily. Patient not taking: Reported on 01/11/2021 06/15/20 09/22/29  [name], MD    Allergies    Ace inhibitors, Influenza vaccines, Ketorolac, Lidocaine, Lisinopril, and Penicillins  Review of Systems   Review of Systems  Constitutional: Negative for fever.  Respiratory: Positive for shortness of breath. Negative for cough.   All other systems reviewed and are negative.   Physical Exam Updated Vital Signs BP 132/88 (BP Location: Left Arm)   Pulse 77   Temp (!) 97.5  F (36.4 C) (Oral)   Resp (!) 22   SpO2 97%   Physical Exam Vitals and nursing note reviewed.  Constitutional:      Appearance: He is well-developed and well-nourished.  HENT:     Head: Normocephalic and atraumatic.  Eyes:     Conjunctiva/sclera: Conjunctivae normal.  Cardiovascular:     Rate and Rhythm: Normal rate and regular rhythm.     Heart sounds: No murmur heard.   Pulmonary:     Effort: Pulmonary effort is normal. No respiratory distress.     Breath sounds: Normal breath sounds. No decreased breath sounds.  Chest:     Chest wall: No deformity or tenderness.  Abdominal:     Palpations: Abdomen is soft.     Tenderness: There is no abdominal tenderness.  Musculoskeletal:        General: No edema. Normal range of motion.     Cervical back: Normal range of motion and neck supple.  Skin:    General: Skin is warm and dry.  Neurological:     General: No focal deficit present.     Mental Status: He is alert.  Psychiatric:        Mood and Affect: Mood and affect and mood normal.     ED Results / Procedures /  Treatments   Labs (all labs ordered are listed, but only abnormal results are displayed) Labs Reviewed  BASIC METABOLIC PANEL - Abnormal; Notable for the following components:      Result Value   CO2 18 (*)    Glucose, Bld 124 (*)    BUN 81 (*)    Creatinine, Ser 12.90 (*)    Calcium 8.6 (*)    GFR, Estimated 4 (*)    Anion gap 16 (*)    All other components within normal limits  CBC - Abnormal; Notable for the following components:   RBC 3.78 (*)    Hemoglobin 11.2 (*)    HCT 35.3 (*)    RDW 15.7 (*)    All other components within normal limits  TROPONIN I (HIGH SENSITIVITY) - Abnormal; Notable for the following components:   Troponin I (High Sensitivity) 156 (*)    All other components within normal limits  TROPONIN I (HIGH SENSITIVITY)    EKG None  Radiology DG Chest 2 View  Result Date: 01/11/2021 CLINICAL DATA:  Chest pain. EXAM: CHEST - 2 VIEW COMPARISON:  December 09, 2020. FINDINGS: Stable cardiomegaly. No pneumothorax or pleural effusion is noted. Stable position of defibrillator device. Lungs are clear. Bony thorax is unremarkable. IMPRESSION: No active cardiopulmonary disease. Electronically Signed   By: Marijo Conception M.D.   On: 01/11/2021 12:17    Procedures Procedures   Medications Ordered in ED Medications - No data to display  ED Course  I have reviewed the triage vital signs and the nursing notes.  Pertinent labs & imaging results that were available during my care of the patient were reviewed by me and considered in my medical decision making (see chart for details).    MDM Rules/Calculators/A&P                          MDM: Pt had covid in January.  Covid not repeated today.  Pt has increase in troponin from his baseline.   I spoke to Dr Posey Pronto.  He advised admit to medicine  Lasix 80mg  tid if pt does not improve, consult to schedule dialysis  Final Clinical Impression(s) / ED Diagnoses Final diagnoses:  Chest pain, unspecified type  Hypoxia   Acute renal failure, unspecified acute renal failure type [facility])    Rx / DC Orders ED Discharge Orders    None       Sidney Ace 01/11/21 1602    Blanchie Dessert, MD 01/11/21 2051

## 2021-01-11 NOTE — ED Notes (Signed)
Dr. Maryan Rued notified of Trop 156.

## 2021-01-11 NOTE — ED Triage Notes (Signed)
Pt reports SOB and pain to center of chest x 2 days.  Using inhalers without relief.  COVID + 1/6.

## 2021-01-11 NOTE — Progress Notes (Signed)
Placed patient on CPAP 12 at this time.

## 2021-01-12 ENCOUNTER — Observation Stay (HOSPITAL_COMMUNITY): Payer: Medicaid Other

## 2021-01-12 ENCOUNTER — Other Ambulatory Visit (HOSPITAL_COMMUNITY): Payer: Self-pay | Admitting: Internal Medicine

## 2021-01-12 DIAGNOSIS — I5043 Acute on chronic combined systolic (congestive) and diastolic (congestive) heart failure: Secondary | ICD-10-CM

## 2021-01-12 DIAGNOSIS — E8770 Fluid overload, unspecified: Secondary | ICD-10-CM

## 2021-01-12 DIAGNOSIS — N289 Disorder of kidney and ureter, unspecified: Secondary | ICD-10-CM

## 2021-01-12 LAB — ECHOCARDIOGRAM LIMITED
Height: 67 in
S' Lateral: 5.3 cm
Weight: 4256 oz

## 2021-01-12 MED ORDER — TORSEMIDE 20 MG PO TABS: 40.0000 mg | ORAL_TABLET | Freq: Two times a day (BID) | ORAL | 1 refills | Status: DC

## 2021-01-12 MED ORDER — TORSEMIDE 20 MG PO TABS: 40.0000 mg | ORAL_TABLET | Freq: Every day | ORAL | Status: DC

## 2021-01-12 MED ORDER — FUROSEMIDE 10 MG/ML IJ SOLN
80.0000 mg | Freq: Once | INTRAMUSCULAR | Status: AC
  Administered 2021-01-12: 80 mg via INTRAVENOUS
  Filled 2021-01-12: qty 8

## 2021-01-12 MED ORDER — FUROSEMIDE 10 MG/ML IJ SOLN: 80.0000 mg | Freq: Once | INTRAMUSCULAR | Status: DC

## 2021-01-12 NOTE — Progress Notes (Signed)
HD#0 Subjective:  Overnight Events:   [name] was evaluated at bedside this morning. He is resting comfortably in bed. He endorses improved breathing and denies any chest pain at this time.  He notes having worsening dyspnea on exertion and orthopnea over past several days  Objective:  Vital signs in last 24 hours: Vitals:   01/11/21 1745 01/11/21 2221 01/11/21 2345 01/12/21 0130  BP: 127/84 (!) 151/92  (!) 155/60  Pulse: 91 82 85 81  Resp: 20 (!) 26 (!) 23 (!) 24  Temp:      TempSrc:      SpO2: 94% 94% 95% 95%  Weight:  120.7 kg    Height:  5\' 7"  (1.702 m)     Supplemental O2: Room Air SpO2: 95 %   Physical Exam:  Physical Exam Constitutional:      Appearance: He is well-developed.  Eyes:     Pupils: Pupils are equal, round, and reactive to light.  Cardiovascular:     Rate and Rhythm: Normal rate and regular rhythm.     Heart sounds: No murmur heard. No friction rub. No gallop.   Pulmonary:     Effort: Pulmonary effort is normal. Tachypnea present. No accessory muscle usage or respiratory distress.     Breath sounds: No rhonchi or rales.  Chest:     Chest wall: No tenderness or edema.  Abdominal:     General: Bowel sounds are normal.     Palpations: Abdomen is soft.     Tenderness: There is no abdominal tenderness.  Musculoskeletal:        General: Normal range of motion.     Cervical back: Normal range of motion and neck supple.     Right lower leg: No edema.     Left lower leg: No edema.  Skin:    General: Skin is warm and dry.     Capillary Refill: Capillary refill takes less than 2 seconds.  Neurological:     General: No focal deficit present.     Mental Status: He is alert and oriented to person, place, and time.  Psychiatric:        Mood and Affect: Mood normal.        Behavior: Behavior normal.     Filed Weights   01/11/21 2221  Weight: 120.7 kg    No intake or output data in the 24 hours ending 01/12/21 0615 Net IO Since  Admission: No IO data has been entered for this period [01/12/21 0615]  Pertinent Labs: CBC Latest Ref Rng & Units 01/11/2021 01/11/2021 12/09/2020  WBC 4.0 - 10.5 K/uL 5.6 7.2 5.5  Hemoglobin 13.0 - 17.0 g/dL 11.0(L) 11.2(L) 10.8(L)  Hematocrit 39.0 - 52.0 % 34.7(L) 35.3(L) 33.0(L)  Platelets 150 - 400 K/uL 214 208 204    CMP Latest Ref Rng & Units 01/11/2021 01/11/2021 12/09/2020  Glucose 70 - 99 mg/dL - 124(H) 101(H)  BUN 6 - 20 mg/dL - 81(H) 75(H)  Creatinine 0.61 - 1.24 mg/dL 13.01(H) 12.90(H) 10.63(H)  Sodium 135 - 145 mmol/L - 142 139  Potassium 3.5 - 5.1 mmol/L - 4.2 3.7  Chloride 98 - 111 mmol/L - 108 106  CO2 22 - 32 mmol/L - 18(L) 17(L)  Calcium 8.9 - 10.3 mg/dL - 8.6(L) 8.2(L)  Total Protein 6.5 - 8.1 g/dL - - -  Total Bilirubin 0.3 - 1.2 mg/dL - - -  Alkaline Phos 38 - 126 U/L - - -  AST 15 - 41  U/L - - -  ALT 0 - 44 U/L - - -   Component Ref Range & Units 1 d ago  (01/11/21) 1 d ago  (01/11/21) 1 mo ago  (12/09/20) 7 mo ago  (06/14/20) 7 mo ago  (06/14/20) 1 yr ago  (12/11/19) 1 yr ago  (11/26/19)  Troponin I (High Sensitivity) <18 ng/L 145High Panic  156High Panic CM  56High CM  114High Panic CM  111High Panic CM  47High CM      Component Ref Range & Units 1 d ago  (01/11/21) 1 mo ago  (12/09/20) 7 mo ago  (06/14/20) 1 yr ago  (11/26/19) 1 yr ago  (07/23/19) 1 yr ago  (07/03/19) 1 yr ago  (04/26/19)  B Natriuretic Peptide 0.0 - 100.0 pg/mL 870.8High  825.1High CM  931.6High CM  680.6High CM  697.5High CM  713.4High     Imaging: DG Chest 2 View  Result Date: 01/11/2021 CLINICAL DATA:  Chest pain. EXAM: CHEST - 2 VIEW COMPARISON:  December 09, 2020. FINDINGS: Stable cardiomegaly. No pneumothorax or pleural effusion is noted. Stable position of defibrillator device. Lungs are clear. Bony thorax is unremarkable. IMPRESSION: No active cardiopulmonary disease. Electronically Signed   By: Marijo Conception M.D.   On: 01/11/2021 12:17    Assessment/Plan:   Active  Problems:   Chest pain   Patient Summary: Chest pain.    ICM, combined heart failure. CAD s/p PCI 2007  Hypertension Chest pain now resolved. SOB back to baseline. Elevated trops to 150s but flat, ikely related to demand in the setting of volume overload. No significant EKG changes from prior.  Last echo in 7/21 with EF 30-35% G2DD.  Plan -continue home medications--amlodipine, coreg, hydralazine, imdur, toresemide, aspirin, statin - echo today -tele monitoring -strict I/O  ESRD not on HD. Right AVF in place. Follows at [facility] Renal function has declined further over the past month. Received 80 mg IV lasix in ED with much improvement in SOB. Weight stable since January. Appears euvolemic on exam this morning no LE edema, abdominal bloating, or JVD. Will give addition dose of diuretics, suspect symptoms due to worsening renal function and plan for discharge tomorrow on home torsemide 40 mg daily. Plan -IV lasix 80 mg  -continue calitriol  OSA. Continue CPAP. Hx of asthma. Continue symbicort. Chronic Anemia--stable.  Diet:  Renal fluid restrict 1200 Fluids: None DVT: heparin Code: Full  Dispo: Anticipated discharge to Home in 1 days pending further mangement.   Iona Beard, MD 01/12/2021, 6:15 AM Pager: [phone]  Please contact the on call pager after 5 pm and on weekends at [phone].

## 2021-01-12 NOTE — Progress Notes (Signed)
Date: 01/12/2021  [name]  Medical record number: [id number]  Date of birth: 1961-09-01   I have seen and evaluated [name] and discussed their care with the Residency Team.  In brief, patient is a 60 year old male with past medical history of chronic combined systolic and diastolic heart failure with grade 2 diastolic dysfunction and EF of 30 to 35%, ESRD not on hemodialysis who presented to the ED with worsening shortness of breath and intermittent chest pain over the last couple of days.  Patient states that over the last couple of days he has noted progressively worsening shortness of breath with dyspnea on exertion as well as orthopnea.  He also endorses a cough productive of white phlegm over the last couple of days.  He states that his chest pain was pressure-like, nonradiating, not worse with exertion, intermittent.  No nausea or vomiting, no palpitations, no diaphoresis, no lightheadedness, no syncope, no focal weakness, no tingling or numbness, no headache, no blurry vision, no fevers or chills.  Today, patient states that he feels well and denies any chest pain or shortness of breath.  States that his orthopnea has resolved.  Was able to ambulate with the nurse tech this morning.  PMHx, Fam Hx, and/or Soc Hx : As per resident admit note  Vitals:   01/12/21 1114 01/12/21 1200  BP: 136/78 118/69  Pulse:  79  Resp:  (!) 28  Temp:    SpO2:  95%   General: Awake, alert, oriented x3, NAD CVS: Regular rate and rhythm, normal heart sounds Lungs: CTA bilaterally Abdomen: Soft, nontender, nondistended, normoactive bowel sounds Extremities: No edema noted, nontender to palpation Psych: Normal mood and affect HEENT: Normocephalic, atraumatic Skin: Warm and dry  Assessment and Plan: I have seen and evaluated the patient as outlined above. I agree with the formulated Assessment and Plan as detailed in the residents' note, with the following changes:   1.   Acute on chronic combined systolic and diastolic heart failure exacerbation: -Patient presented to the ED with worsening shortness of breath with dyspnea on exertion and orthopnea in the setting of ESRD not on hemodialysis as well as chronic combined heart failure with symptoms consistent of an acute on chronic heart failure exacerbation.  Chest x-ray with no acute pathology noted and he only had trace edema on admission.  He received 1 dose of IV Lasix in the ED with resolution of symptoms. -Patient states that he had significant urine output after his IV Lasix dose.  He was compliant with his oral torsemide at home but may require a higher dose given his poor renal function. -He received an additional dose of IV Lasix 80 mg today -We will transition him to oral torsemide at increased dose (20 mg twice daily) for now and have him follow-up with his PCP and nephrology as an outpatient -Patient blood pressure is well controlled currently.  We will continue with current medications -Patient did have episodes of chest pain which I suspect was likely secondary to his heart failure exacerbation.  His troponins have been flat here and no ischemic changes are noted on his EKG.  Patient's chest pain resolved with diuresis.  I suspect that the mildly elevated troponins were likely secondary to his ESRD as well as mild demand from his heart failure exacerbation. -We will follow-up 2D echo.  If this is unchanged will DC patient home for outpatient follow-up -No further work-up at this time.  Patient will likely eventually need  hemodialysis eventually given his ESRD but no acute indication for this currently.  Aldine Contes, MD 2/7/20222:12 PM

## 2021-01-12 NOTE — ED Notes (Signed)
Attempted to give report to floor, this rn to be called back, floor nurse unavailable.

## 2021-01-12 NOTE — Discharge Summary (Signed)
Name: Victor Thompson MRN: [id number] DOB: [birth year] 60 y.o. PCP: Victor Pier, MD  Date of Admission: 01/11/2021 11:15 AM Date of Discharge:  Attending Physician: Aldine Contes, MD  Discharge Diagnosis: 1. Chest pain 2. ESRD  Discharge Medications: Allergies as of 01/12/2021      Reactions   Ace Inhibitors Swelling   Swelling of the tongue   Influenza Vaccines Hives, Swelling   SWELLING REACTION UNSPECIFIED    Ketorolac Swelling   SWELLING REACTION UNSPECIFIED    Lidocaine Swelling   TONGUE SWELLS   Lisinopril Swelling   TONGUE SWELLING Pt reported problem with a BP med which sounded like  lisinopril  But as of 09/19/06,pt had tolerated altace without problem   Penicillins Swelling   TONGUE SWELLS Has patient had a PCN reaction causing immediate rash, facial/tongue/throat swelling, SOB or lightheadedness with hypotension: Yes Has patient had a PCN reaction causing severe rash involving mucus membranes or skin necrosis: No Has patient had a PCN reaction that required hospitalization No Has patient had a PCN reaction occurring within the last 10 years: Yes If all of the above answers are "NO", then may proceed with Cephalosporin use.      Medication List    TAKE these medications   albuterol 108 (90 Base) MCG/ACT inhaler Commonly known as: VENTOLIN HFA Inhale 2 puffs into the lungs every 4 (four) hours as needed for wheezing or shortness of breath.   albuterol (2.5 MG/3ML) 0.083% nebulizer solution Commonly known as: PROVENTIL TAKE 3 MLS (2.5 MG TOTAL) BY NEBULIZATION EVERY 6 (SIX) HOURS AS NEEDED FOR WHEEZING OR SHORTNESS OF BREATH.   amLODipine 10 MG tablet Commonly known as: NORVASC Take 1 tablet (10 mg total) by mouth daily.   aspirin EC 81 MG tablet Take 1 tablet (81 mg total) by mouth daily.   atorvastatin 40 MG tablet Commonly known as: LIPITOR TAKE 1 TABLET (40 MG TOTAL) BY MOUTH AT BEDTIME.   budesonide-formoterol 80-4.5 MCG/ACT  inhaler Commonly known as: SYMBICORT Inhale 2 puffs into the lungs 2 (two) times daily. What changed:   when to take this  reasons to take this   calcitRIOL 0.5 MCG capsule Commonly known as: ROCALTROL Take 0.5 mcg by mouth daily.   carvedilol 25 MG tablet Commonly known as: COREG TAKE 1 TABLET (25 MG TOTAL) BY MOUTH 2 (TWO) TIMES DAILY WITH A MEAL.   cetirizine 10 MG tablet Commonly known as: ZyrTEC Allergy Take 1 tablet (10 mg total) by mouth at bedtime.   EPINEPHrine 0.3 mg/0.3 mL Soaj injection Commonly known as: EPI-PEN Inject 0.3 mLs (0.3 mg total) into the muscle once. What changed: Another medication with the same name was removed. Continue taking this medication, and follow the directions you see here.   EPINEPHrine 0.3 mg/0.3 mL Soaj injection Commonly known as: EPI-PEN Inject 0.3 mg into the muscle once as needed for up to 1 dose (if worsening tongue swelling, SOB, hypoxia, or other concerns for progressive anaphylaxis). What changed: Another medication with the same name was removed. Continue taking this medication, and follow the directions you see here.   fluticasone 50 MCG/ACT nasal spray Commonly known as: FLONASE Place 1 spray into both nostrils daily. What changed:   when to take this  reasons to take this   hydrALAZINE 25 MG tablet Commonly known as: APRESOLINE Take 1 tablet (25 mg total) by mouth 3 (three) times daily.   hydrALAZINE 10 MG tablet Commonly known as: APRESOLINE Take 20 mg by mouth 2 (  two) times daily.   isosorbide mononitrate 30 MG 24 hr tablet Commonly known as: IMDUR TAKE 1 TABLET (30 MG TOTAL) BY MOUTH DAILY.   torsemide 20 MG tablet Commonly known as: DEMADEX Take 2 tablets (40 mg total) by mouth 2 (two) times daily. TAKE 2 TABLETS (40 MG TOTAL) BY MOUTH DAILY. What changed:   how much to take  how to take this  when to take this   Vitamin D-3 125 MCG (5000 UT) Tabs Take 5,000 Units by mouth daily.        Disposition and follow-up:   Victor Thompson was discharged from [facility] in Stable condition.  At the hospital follow up visit please address:  1.  ESRD  - Assess symptoms of volume overload  - F/u with nephrology at [facility] for repeat BMP  Acute on chronic combined systolic and diastolic HF exacerbation Echocardiogram showed slight reduction of EF to 25-30% - Increase Torsemide to 40 mg twice daily  - F/u with Cardiology - F/u with PCP for repeat BMP  2.  Labs / imaging needed at time of follow-up: BMP  3.  Pending labs/ test needing follow-up: None  Follow-up Appointments:  Follow-up Information    Victor Pier, MD. Schedule an appointment as soon as possible for a visit in 1 week(s).   Specialty: Internal Medicine Contact information: [place] [address] [phone]        Victor Lance, MD. Schedule an appointment as soon as possible for a visit in 1 week(s).   Specialty: Cardiology Contact information: [address] [place] [address] [phone]               HPI [name] was evaluated at bedside this morning. He is resting comfortably in bed. He endorses improved breathing and denies any chest pain at this time.  He notes having worsening dyspnea on exertion and orthopnea over past several days  Hospital Course by problem list: 1.  Acute on chronic combined systolic and diastolic heart failure exacerbation Patient presented to the ED for worsening substernal chest pain, shortness of breath and dyspnea on exertion.  Patient also complains of orthopnea and dry cough.  Chest x-ray shows no acute pathology.  Patient is not significantly volume overloaded on exam.  His symptom improved with 1 dose of IV Lasix 80 mg in the ED and he received an additional IV Lasix 80 mg.  His chest pain was likely related to his heart failure exacerbation with 5 troponins and no ischemic  changes noted on EKG.  His chest pain resolved with diuresis.  Echocardiogram did show slight reduction in EF to 25-30%.  His home medications was continued.  Patient will be discharged home with increased dose of torsemide 40 mg twice daily and follow-up with his cardiologist outpatient.  2.  ESRD Patient has history of ESRD not on dialysis.  His renal function has declined further over the past month.  Patient did not appear significantly overloaded on exam.  He will continue follow-up with [facility] after discharge.  Discharge Exam:   BP 121/65   Pulse 81   Temp (!) 97.5 F (36.4 C) (Oral)   Resp 18   Ht 5\' 7"  (1.702 m)   Wt 120.7 kg   SpO2 92%   BMI 41.66 kg/m  Discharge exam:   Physical Exam Constitutional:      Appearance: He is well-developed.  Eyes:  Pupils: Pupils are equal, round, and reactive to light.  Cardiovascular:     Rate and Rhythm: Normal rate and regular rhythm.     Heart sounds: No murmur heard. No friction rub. No gallop.   Pulmonary:     Effort: Pulmonary effort is normal. Tachypnea present. No accessory muscle usage or respiratory distress.     Breath sounds: No rhonchi or rales.  Chest:     Chest wall: No tenderness or edema.  Abdominal:     General: Bowel sounds are normal.     Palpations: Abdomen is soft.     Tenderness: There is no abdominal tenderness.  Musculoskeletal:        General: Normal range of motion.     Cervical back: Normal range of motion and neck supple.     Right lower leg: No edema.     Left lower leg: No edema.  Skin:    General: Skin is warm and dry.     Capillary Refill: Capillary refill takes less than 2 seconds.  Neurological:     General: No focal deficit present.     Mental Status: He is alert and oriented to person, place, and time.  Psychiatric:        Mood and Affect: Mood normal.        Behavior: Behavior normal.   Pertinent Labs, Studies, and Procedures:  CBC Latest Ref Rng & Units 01/11/2021 01/11/2021  12/09/2020  WBC 4.0 - 10.5 K/uL 5.6 7.2 5.5  Hemoglobin 13.0 - 17.0 g/dL 11.0(L) 11.2(L) 10.8(L)  Hematocrit 39.0 - 52.0 % 34.7(L) 35.3(L) 33.0(L)  Platelets 150 - 400 K/uL 214 208 204   CMP Latest Ref Rng & Units 01/11/2021 01/11/2021 12/09/2020  Glucose 70 - 99 mg/dL - 124(H) 101(H)  BUN 6 - 20 mg/dL - 81(H) 75(H)  Creatinine 0.61 - 1.24 mg/dL 13.01(H) 12.90(H) 10.63(H)  Sodium 135 - 145 mmol/L - 142 139  Potassium 3.5 - 5.1 mmol/L - 4.2 3.7  Chloride 98 - 111 mmol/L - 108 106  CO2 22 - 32 mmol/L - 18(L) 17(L)  Calcium 8.9 - 10.3 mg/dL - 8.6(L) 8.2(L)  Total Protein 6.5 - 8.1 g/dL - - -  Total Bilirubin 0.3 - 1.2 mg/dL - - -  Alkaline Phos 38 - 126 U/L - - -  AST 15 - 41 U/L - - -  ALT 0 - 44 U/L - - -   DG Chest 2 View  Result Date: 01/11/2021 CLINICAL DATA:  Chest pain. EXAM: CHEST - 2 VIEW COMPARISON:  December 09, 2020. FINDINGS: Stable cardiomegaly. No pneumothorax or pleural effusion is noted. Stable position of defibrillator device. Lungs are clear. Bony thorax is unremarkable. IMPRESSION: No active cardiopulmonary disease. Electronically Signed   By: Marijo Conception M.D.   On: 01/11/2021 12:17   ECHOCARDIOGRAM LIMITED  Result Date: 01/12/2021  IMPRESSIONS  1. Left ventricular ejection fraction, by estimation, is 25 to 30%. The left ventricle has severely decreased function. The left ventricle demonstrates global hypokinesis. The left ventricular internal cavity size was moderately dilated. There is mild left ventricular hypertrophy. Left ventricular diastolic parameters are consistent with Grade II diastolic dysfunction (pseudonormalization).  2. Right ventricular systolic function is normal. The right ventricular size is normal. There is normal pulmonary artery systolic pressure. The estimated right ventricular systolic pressure is 32.9 mmHg.  3. The mitral valve is normal in structure. Mild mitral valve regurgitation. No evidence of mitral stenosis.  4. The aortic valve is tricuspid.  Aortic  valve regurgitation is not visualized. Mild aortic valve sclerosis is present, with no evidence of aortic valve stenosis.  5. The inferior vena cava is normal in size with greater than 50% respiratory variability, suggesting right atrial pressure of 3 mmHg.   Discharge Instructions: Discharge Instructions    (HEART FAILURE PATIENTS) Call MD:  Anytime you have any of the following symptoms: 1) 3 pound weight gain in 24 hours or 5 pounds in 1 week 2) shortness of breath, with or without a dry hacking cough 3) swelling in the hands, feet or stomach 4) if you have to sleep on extra pillows at night in order to breathe.   Complete by: As directed    Call MD for:  difficulty breathing, headache or visual disturbances   Complete by: As directed    Call MD for:  extreme fatigue   Complete by: As directed    Call MD for:  hives   Complete by: As directed    Call MD for:  persistant dizziness or light-headedness   Complete by: As directed    Call MD for:  persistant nausea and vomiting   Complete by: As directed    Call MD for:  temperature >100.4   Complete by: As directed    Diet - low sodium heart healthy   Complete by: As directed    Discharge instructions   Complete by: As directed    Mr Madyx Delfin,  You were admitted with worsening shortness of breath when laying flat and walking. You were given IV Lasix to help remove extra volume resulting in improvement in your symptoms. We obtained an Echo during this admission which showed slightly reduced heart function.  On discharge, please increase your torsemide dosing to 40mg  twice daily. Please continue to take all other medications as prescribed. Please follow up with your PCP and cardiologist within 1-2 weeks.  Thank you!   Increase activity slowly   Complete by: As directed       Signed: Harvie Heck, MD 01/12/2021, 7:25 PM

## 2021-01-12 NOTE — ED Notes (Signed)
Pt attempting to find ride at this time. Going up for discharge

## 2021-01-12 NOTE — ED Notes (Signed)
Trop 156 Breakfast order placed

## 2021-01-12 NOTE — ED Notes (Signed)
Pt able to ambulate with 02 remaining at 94% room air and pulse at 90. Nurse notified.

## 2021-01-12 NOTE — Progress Notes (Signed)
  Echocardiogram 2D Echocardiogram has been performed.  Victor Thompson 01/12/2021, 5:35 PM

## 2021-01-13 ENCOUNTER — Other Ambulatory Visit: Payer: Self-pay | Admitting: *Deleted

## 2021-01-13 ENCOUNTER — Other Ambulatory Visit: Payer: Self-pay | Admitting: Pharmacy Technician

## 2021-01-13 ENCOUNTER — Other Ambulatory Visit: Payer: Self-pay | Admitting: Internal Medicine

## 2021-01-13 DIAGNOSIS — I5042 Chronic combined systolic (congestive) and diastolic (congestive) heart failure: Secondary | ICD-10-CM

## 2021-01-13 MED ORDER — TORSEMIDE 20 MG PO TABS: 40.0000 mg | ORAL_TABLET | Freq: Two times a day (BID) | ORAL | 1 refills | Status: DC

## 2021-01-13 MED FILL — TORSEMIDE 20 MG TABLET: 20 | 30 days supply | Qty: 60 | Fill #0

## 2021-01-13 NOTE — Telephone Encounter (Signed)
Hello. Prescription was sent recently by his PCP's office. It looks like he follows with [facility]. I am unsure why the prescription clarification was sent to [place].

## 2021-01-13 NOTE — Telephone Encounter (Signed)
[facility] received Torsemide rx with 2 different directions. Please clarify and send new rx. Thanks

## 2021-01-13 NOTE — Telephone Encounter (Signed)
Pt was discharged yesterday by [facility]; Torsemide rx was written by Dr Marva Panda - it has 2 different instructions. The pharmacy needs clarification.

## 2021-01-14 MED FILL — CARVEDILOL 25 MG TABLET: 25 | 30 days supply | Qty: 60 | Fill #1

## 2021-01-15 ENCOUNTER — Other Ambulatory Visit: Payer: Self-pay

## 2021-01-15 ENCOUNTER — Ambulatory Visit (INDEPENDENT_AMBULATORY_CARE_PROVIDER_SITE_OTHER): Payer: Medicare Other | Admitting: Internal Medicine

## 2021-01-15 ENCOUNTER — Encounter: Payer: Self-pay | Admitting: Internal Medicine

## 2021-01-15 VITALS — BP 112/70 | HR 78 | Ht 67.0 in | Wt 265.0 lb

## 2021-01-15 DIAGNOSIS — I472 Ventricular tachycardia, unspecified: Secondary | ICD-10-CM

## 2021-01-15 DIAGNOSIS — I255 Ischemic cardiomyopathy: Secondary | ICD-10-CM

## 2021-01-15 LAB — CUP PACEART INCLINIC DEVICE CHECK
Date Time Interrogation Session: 20220210140353
Implantable Lead Implant Date: 20150507
Implantable Lead Implant Date: 20150507
Implantable Lead Location: 753862
Implantable Lead Location: 753862
Implantable Lead Model: 3400
Implantable Lead Model: 3400
Implantable Pulse Generator Implant Date: 20211108
Pulse Gen Serial Number: 146303

## 2021-01-15 NOTE — Progress Notes (Signed)
PCP: [name], MD Primary Cardiologist: Dr Aundra Dubin Primary EP: Dr Percell Boston is a 60 y.o. male who presents today for routine electrophysiology followup.  Since his ICD generator change, the patient reports doing reasonably well.  He developed COVID in January.  He has had worsening SOB since that time.  + cough.  Today, he denies symptoms of palpitations, chest pain, lower extremity edema, dizziness, presyncope, syncope, or ICD shocks.  The patient is otherwise without complaint today.   Past Medical History:  Diagnosis Date  . AICD (automatic cardioverter/defibrillator) present    2015  . Asthma    no meds  . Cardiac arrest ([place]) 2015  . CHF (congestive heart failure) ([place])   . Chronic kidney disease    ckd -stage 5  . Coronary artery disease   . Hypertension   . Myocardial infarction ([place])   . Obesity   . Pneumonia   . Pulmonary edema   . Shortness of breath dyspnea   . Sleep apnea    USES CPAP  . Wears glasses    Past Surgical History:  Procedure Laterality Date  . AV FISTULA PLACEMENT Right 03/15/2019   Procedure: RIGHT ARM ARTERIOVENOUS (AV) FISTULA CREATION;  Surgeon: Angelia Mould, MD;  Location: [facility];  Service: Vascular;  Laterality: Right;  . COLONOSCOPY    . COLONOSCOPY W/ BIOPSIES AND POLYPECTOMY    . CORONARY STENT PLACEMENT  2007  . IMPLANTABLE CARDIOVERTER DEFIBRILLATOR IMPLANT  2015  . SUBQ ICD CHANGEOUT N/A 10/13/2020   Procedure: SUBQ ICD CHANGEOUT;  Surgeon: Evans Lance, MD;  Location: [facility];  Service: Cardiovascular;  Laterality: N/A;    ROS- all systems are reviewed and negative except as per HPI above  Current Outpatient Medications  Medication Sig Dispense Refill  . albuterol (PROVENTIL) (2.5 MG/3ML) 0.083% nebulizer solution TAKE 3 MLS (2.5 MG TOTAL) BY NEBULIZATION EVERY 6 (SIX) HOURS AS NEEDED FOR WHEEZING OR SHORTNESS OF BREATH. 75 mL 12  . albuterol (VENTOLIN HFA) 108 (90 Base) MCG/ACT inhaler  Inhale 2 puffs into the lungs every 4 (four) hours as needed for wheezing or shortness of breath. 8 g 6  . amLODipine (NORVASC) 10 MG tablet Take 1 tablet (10 mg total) by mouth daily. 30 tablet 3  . aspirin EC 81 MG tablet Take 1 tablet (81 mg total) by mouth daily. 30 tablet 5  . atorvastatin (LIPITOR) 40 MG tablet TAKE 1 TABLET (40 MG TOTAL) BY MOUTH AT BEDTIME. 30 tablet 1  . budesonide-formoterol (SYMBICORT) 80-4.5 MCG/ACT inhaler Inhale 2 puffs into the lungs 2 (two) times daily. (Patient taking differently: Inhale 2 puffs into the lungs as needed (wheezing).) 1 Inhaler 6  . calcitRIOL (ROCALTROL) 0.5 MCG capsule Take 0.5 mcg by mouth daily.   11  . carvedilol (COREG) 25 MG tablet TAKE 1 TABLET (25 MG TOTAL) BY MOUTH 2 (TWO) TIMES DAILY WITH A MEAL. 180 tablet 1  . Cholecalciferol (VITAMIN D-3) 125 MCG (5000 UT) TABS Take 5,000 Units by mouth daily.     Marland Kitchen EPINEPHrine 0.3 mg/0.3 mL IJ SOAJ injection Inject 0.3 mg into the muscle once as needed for up to 1 dose (if worsening tongue swelling, SOB, hypoxia, or other concerns for progressive anaphylaxis). 1 each 0  . fluticasone (FLONASE) 50 MCG/ACT nasal spray Place 1 spray into both nostrils daily. (Patient taking differently: Place 1 spray into both nostrils as needed.) 16 g 2  . hydrALAZINE (APRESOLINE) 10 MG tablet  Take 20 mg by mouth 2 (two) times daily.    . hydrALAZINE (APRESOLINE) 25 MG tablet Take 1 tablet (25 mg total) by mouth 3 (three) times daily. 90 tablet 1  . isosorbide mononitrate (IMDUR) 30 MG 24 hr tablet TAKE 1 TABLET (30 MG TOTAL) BY MOUTH DAILY. 90 tablet 0  . torsemide (DEMADEX) 20 MG tablet Take 2 tablets (40 mg total) by mouth 2 (two) times daily. 60 tablet 1  . cetirizine (ZYRTEC ALLERGY) 10 MG tablet Take 1 tablet (10 mg total) by mouth at bedtime. 30 tablet 0  . EPINEPHrine 0.3 mg/0.3 mL IJ SOAJ injection Inject 0.3 mLs (0.3 mg total) into the muscle once. 1 Device 0   No current facility-administered medications for  this visit.    Physical Exam: Vitals:   01/15/21 1353  BP: 112/70  Pulse: 78  SpO2: 94%  Weight: 265 lb (120.2 kg)  Height: 5\' 7"  (1.702 m)    GEN- The patient is overweight appearing, alert and oriented x 3 today.   Head- normocephalic, atraumatic Eyes-  Sclera clear, conjunctiva pink Ears- hearing intact Oropharynx- clear Lungs- Clear to ausculation bilaterally, normal work of breathing Chest- ICD pocket is well healed Heart- Regular rate and rhythm, no murmurs, rubs or gallops, PMI not laterally displaced GI- soft, NT, ND, + BS Extremities- no clubbing, cyanosis, or edema  ICD interrogation- reviewed in detail today,  See PACEART report   Wt Readings from Last 3 Encounters:  01/15/21 265 lb (120.2 kg)  01/11/21 266 lb (120.7 kg)  12/09/20 266 lb (120.7 kg)    Assessment and Plan:  1.  Chronic systolic dysfunction/ ischemic CM Mild volume overload today Stable on an appropriate medical regimen Normal S-ICD function See [facility] report No changes today He has QRS < 150 msec, we have therefore not advised CRT upgrade  He has advanced renal failure.  He has an appointment with nephrology soon.  I would anticipate that CHF symptoms will improve with initiation of  HD.  2. Prior VF arrest Normal ICD function  Risks, benefits and potential toxicities for medications prescribed and/or refilled reviewed with patient today.   Return to see EP PA every 6 months  [name] MD, [facility] 01/15/2021 2:15 PM

## 2021-01-15 NOTE — Patient Instructions (Signed)
Medication Instructions:  Your physician recommends that you continue on your current medications as directed. Please refer to the Current Medication list given to you today.  Labwork: None ordered.  Testing/Procedures: None ordered.  Follow-Up:  Your physician wants you to follow-up in: 6 months with  Legrand Como "Jonni Sanger" Dunlap, PA-C   You will receive a reminder letter in the mail two months in advance. If you don't receive a letter, please call our office to schedule the follow-up appointment.  Remote monitoring is used to monitor your ICD from home. This monitoring reduces the number of office visits required to check your device to one time per year. It allows [place] to keep an eye on the functioning of your device to ensure it is working properly. You are scheduled for a device check from home on 04/23/21. You may send your transmission at any time that day. If you have a wireless device, the transmission will be sent automatically. After your physician reviews your transmission, you will receive a postcard with your next transmission date.  Any Other Special Instructions Will Be Listed Below (If Applicable).  If you need a refill on your cardiac medications before your next appointment, please call your pharmacy.

## 2021-01-20 ENCOUNTER — Other Ambulatory Visit: Payer: Self-pay | Admitting: Nephrology

## 2021-01-20 MED FILL — RENA-VITE TABS: 30 days supply | Qty: 30 | Fill #0

## 2021-01-20 MED FILL — ETHYL CHLORIDE AERO: 1 days supply | Qty: 116 | Fill #0

## 2021-01-26 MED FILL — AMLODIPINE BESYLATE 10 MG T: 10 | 30 days supply | Qty: 30 | Fill #0

## 2021-01-28 ENCOUNTER — Other Ambulatory Visit: Payer: Self-pay | Admitting: Internal Medicine

## 2021-01-28 NOTE — Telephone Encounter (Signed)
Requested medication (s) are due for refill today: no  Requested medication (s) are on the active medication list: no  Last refill:  06/20/2018  Future visit scheduled: yes  Notes to clinic:  this refill cannot be delegated    Requested Prescriptions  Pending Prescriptions Disp Refills   traMADol (ULTRAM) 50 MG tablet [Pharmacy Med Name: traMADol HCL 50 MG TABS 50 Tablet] 14 tablet 1    Sig: TAKE 1 TABLET BY MOUTH EVERY 12 HOURS AS NEEDED.      Not Delegated - Analgesics:  Opioid Agonists Failed - 01/28/2021  9:08 AM      Failed - This refill cannot be delegated      Failed - Urine Drug Screen completed in last 360 days      Passed - Valid encounter within last 6 months    Recent Outpatient Visits           3 weeks ago COVID-19 virus infection   [facility] [place], [place], [place]   3 months ago Hospital discharge follow-up   [place], [place]   7 months ago Hospital discharge follow-up   [place], MD   9 months ago Severe persistent asthma with acute exacerbation   [place], [place]   10 months ago COPD with acute bronchitis [facility])   [place], [place]       Future Appointments             In 1 week [name], MD [place]

## 2021-02-02 ENCOUNTER — Other Ambulatory Visit: Payer: Self-pay | Admitting: Internal Medicine

## 2021-02-02 DIAGNOSIS — I2510 Atherosclerotic heart disease of native coronary artery without angina pectoris: Secondary | ICD-10-CM

## 2021-02-02 DIAGNOSIS — E785 Hyperlipidemia, unspecified: Secondary | ICD-10-CM

## 2021-02-02 MED FILL — ATORVASTATIN CALCIUM 40 MG: 40 | 7 days supply | Qty: 7 | Fill #0

## 2021-02-04 MED FILL — TORSEMIDE 20 MG TABLET: 20 | 15 days supply | Qty: 60 | Fill #0

## 2021-02-06 ENCOUNTER — Encounter: Payer: Self-pay | Admitting: Internal Medicine

## 2021-02-06 ENCOUNTER — Other Ambulatory Visit: Payer: Self-pay | Admitting: Internal Medicine

## 2021-02-06 ENCOUNTER — Ambulatory Visit: Payer: Medicare Other | Attending: Internal Medicine | Admitting: Internal Medicine

## 2021-02-06 ENCOUNTER — Other Ambulatory Visit: Payer: Self-pay

## 2021-02-06 VITALS — BP 121/78 | HR 81 | Resp 16 | Wt 262.8 lb

## 2021-02-06 DIAGNOSIS — N186 End stage renal disease: Secondary | ICD-10-CM | POA: Insufficient documentation

## 2021-02-06 DIAGNOSIS — Z992 Dependence on renal dialysis: Secondary | ICD-10-CM

## 2021-02-06 DIAGNOSIS — I5042 Chronic combined systolic (congestive) and diastolic (congestive) heart failure: Secondary | ICD-10-CM

## 2021-02-06 DIAGNOSIS — I10 Essential (primary) hypertension: Secondary | ICD-10-CM

## 2021-02-06 DIAGNOSIS — G8929 Other chronic pain: Secondary | ICD-10-CM

## 2021-02-06 DIAGNOSIS — J449 Chronic obstructive pulmonary disease, unspecified: Secondary | ICD-10-CM

## 2021-02-06 DIAGNOSIS — Z79899 Other long term (current) drug therapy: Secondary | ICD-10-CM

## 2021-02-06 DIAGNOSIS — M545 Low back pain, unspecified: Secondary | ICD-10-CM

## 2021-02-06 DIAGNOSIS — Z6841 Body Mass Index (BMI) 40.0 and over, adult: Secondary | ICD-10-CM

## 2021-02-06 MED ORDER — TRAMADOL HCL 50 MG PO TABS: 50.0000 mg | ORAL_TABLET | Freq: Two times a day (BID) | ORAL | 1 refills | Status: DC | PRN

## 2021-02-06 MED FILL — traMADol HCL 50 MG TABS: 50 | 30 days supply | Qty: 60 | Fill #0

## 2021-02-06 NOTE — Patient Instructions (Signed)
Go to the radiology department at [facility] to get the x-rays of the lower back done.

## 2021-02-06 NOTE — Progress Notes (Signed)
Patient ID: Victor Thompson, male    DOB: May 24, 1961  MRN: [id number]  CC: Hospitalization Follow-up (ED)   Subjective: Victor Thompson is a 60 y.o. male who presents for chronic disease management His concerns today include:  Pt with hx of ESRD on hemodialysis(Dr. Joelyn Oms), HTN, CAD (with hx of cardiac arrest and), ICD, combined sys/dia CHF(EF30-35%03/2020), HL, Obese,COPD,chronic lower back pain with multilevel spondylosis.  HYPERTENSION/CHF/CAD/ICD Currently taking: see medication list.  He is on amlodipine, hydralazine, carvedilol, isosorbide, torsemide.  Hospitalized a month ago with decompensated CHF.  He has been limiting the salt in the foods. Med Adherence: [x]  Yes    []  No Medication side effects: []  Yes    [x]  No Adherence with salt restriction: [x]  Yes    []  No Home Monitoring?: [x]  Yes  -at HD Monitoring Frequency: Patient reports that his blood pressure has been good during hemodialysis sessions. Home BP results range: []  Yes    []  No SOB? []  Yes    [x]  No Chest Pain?: []  Yes    [x]  No Leg swelling?: []  Yes    [x]  No Headaches?: []  Yes    []  No Dizziness? []  Yes    []  No Comments: No PND/orthopnea.  He has an appointment coming up with a cardiologist Dr. Marigene Ehlers next week.  ESRD: started HD 01/21/2020 Since starting HD he reports a flareup of pain across his lower back. No radiation, numbness or tingling in legs.  Worse when up moving around and laying down.  Interferes with sleep.   Intermittent. Sharp pain.   Was on Tramadol in past for his back. Last x-ray of the lumbar spine done 05/2015 revealed mild multilevel spondylosis.  COPD: Reports compliance with Symbicort inhaler.  No recent flares.  Patient Active Problem List   Diagnosis Date Noted  . Hypervolemia associated with renal insufficiency 01/12/2021  . Chest pain 01/11/2021  . Angioedema 09/22/2020  . Acute on chronic combined systolic and diastolic CHF (congestive heart failure) ([place]) 06/14/2020   . Secondary hyperparathyroidism of renal origin ([place]) 07/15/2019  . Vitamin D deficiency 07/15/2019  . CAD (coronary artery disease), native coronary artery 04/10/2019  . CKD (chronic kidney disease) stage 5, GFR less than 15 ml/min (HCC) 09/26/2018  . Chronic midline low back pain without sciatica 02/17/2018  . VT (ventricular tachycardia) ([place]) 03/29/2016  . Defibrillator discharge   . Chronic combined systolic and diastolic CHF (congestive heart failure) ([place]) 01/22/2016  . Erectile dysfunction 01/22/2016  . Allergic rhinitis 01/22/2016  . GERD (gastroesophageal reflux disease) 01/22/2016  . PROTEINURIA 02/17/2010  . Cardiomyopathy, ischemic 02/16/2010  . Obstructive sleep apnea 02/16/2010  . EXTERNAL HEMORRHOIDS 09/26/2009  . Tobacco use disorder in remission 06/03/2009  . Essential hypertension 10/01/2008  . Hyperlipidemia 07/14/2007  . Class 3 severe obesity due to excess calories with serious comorbidity and body mass index (BMI) of 40.0 to 44.9 in adult ([place]) 07/14/2007  . Asthma 07/14/2007     Current Outpatient Medications on File Prior to Visit  Medication Sig Dispense Refill  . albuterol (PROVENTIL) (2.5 MG/3ML) 0.083% nebulizer solution TAKE 3 MLS (2.5 MG TOTAL) BY NEBULIZATION EVERY 6 (SIX) HOURS AS NEEDED FOR WHEEZING OR SHORTNESS OF BREATH. 75 mL 12  . albuterol (VENTOLIN HFA) 108 (90 Base) MCG/ACT inhaler Inhale 2 puffs into the lungs every 4 (four) hours as needed for wheezing or shortness of breath. 8 g 6  . amLODipine (NORVASC) 10 MG tablet Take 1 tablet (10 mg total) by mouth daily. [place]  tablet 3  . aspirin EC 81 MG tablet Take 1 tablet (81 mg total) by mouth daily. 30 tablet 5  . atorvastatin (LIPITOR) 40 MG tablet TAKE 1 TABLET (40 MG TOTAL) BY MOUTH AT BEDTIME. 7 tablet 0  . budesonide-formoterol (SYMBICORT) 80-4.5 MCG/ACT inhaler Inhale 2 puffs into the lungs 2 (two) times daily. (Patient taking differently: Inhale 2 puffs into the lungs as needed (wheezing).) 1  Inhaler 6  . calcitRIOL (ROCALTROL) 0.5 MCG capsule Take 0.5 mcg by mouth daily.   11  . carvedilol (COREG) 25 MG tablet TAKE 1 TABLET (25 MG TOTAL) BY MOUTH 2 (TWO) TIMES DAILY WITH A MEAL. 180 tablet 1  . cetirizine (ZYRTEC ALLERGY) 10 MG tablet Take 1 tablet (10 mg total) by mouth at bedtime. 30 tablet 0  . Cholecalciferol (VITAMIN D-3) 125 MCG (5000 UT) TABS Take 5,000 Units by mouth daily.     Marland Kitchen EPINEPHrine 0.3 mg/0.3 mL IJ SOAJ injection Inject 0.3 mLs (0.3 mg total) into the muscle once. 1 Device 0  . EPINEPHrine 0.3 mg/0.3 mL IJ SOAJ injection Inject 0.3 mg into the muscle once as needed for up to 1 dose (if worsening tongue swelling, SOB, hypoxia, or other concerns for progressive anaphylaxis). 1 each 0  . fluticasone (FLONASE) 50 MCG/ACT nasal spray Place 1 spray into both nostrils daily. (Patient taking differently: Place 1 spray into both nostrils as needed.) 16 g 2  . hydrALAZINE (APRESOLINE) 10 MG tablet Take 20 mg by mouth 2 (two) times daily.    . hydrALAZINE (APRESOLINE) 25 MG tablet Take 1 tablet (25 mg total) by mouth 3 (three) times daily. 90 tablet 1  . isosorbide mononitrate (IMDUR) 30 MG 24 hr tablet TAKE 1 TABLET (30 MG TOTAL) BY MOUTH DAILY. 90 tablet 0  . torsemide (DEMADEX) 20 MG tablet Take 2 tablets (40 mg total) by mouth 2 (two) times daily. 60 tablet 1   No current facility-administered medications on file prior to visit.    Allergies  Allergen Reactions  . Ace Inhibitors Swelling    Swelling of the tongue  . Influenza Vaccines Hives and Swelling    SWELLING REACTION UNSPECIFIED   . Ketorolac Swelling    SWELLING REACTION UNSPECIFIED   . Lidocaine Swelling    TONGUE SWELLS  . Lisinopril Swelling    TONGUE SWELLING Pt reported problem with a BP med which sounded like  lisinopril  But as of 09/19/06,pt had tolerated altace without problem  . Penicillins Swelling    TONGUE SWELLS Has patient had a PCN reaction causing immediate rash, facial/tongue/throat  swelling, SOB or lightheadedness with hypotension: Yes Has patient had a PCN reaction causing severe rash involving mucus membranes or skin necrosis: No Has patient had a PCN reaction that required hospitalization No Has patient had a PCN reaction occurring within the last 10 years: Yes If all of the above answers are "NO", then may proceed with Cephalosporin use.     Social History   Socioeconomic History  . Marital status: Single    Spouse name: Not on file  . Number of children: Not on file  . Years of education: Not on file  . Highest education level: Not on file  Occupational History  . Not on file  Tobacco Use  . Smoking status: Never Smoker  . Smokeless tobacco: Never Used  Vaping Use  . Vaping Use: Never used  Substance and Sexual Activity  . Alcohol use: No  . Drug use: No  . Sexual  activity: Yes  Other Topics Concern  . Not on file  Social History Narrative   ** Merged History Encounter **       Social Determinants of Health   Financial Resource Strain: Not on file  Food Insecurity: Not on file  Transportation Needs: Not on file  Physical Activity: Not on file  Stress: Not on file  Social Connections: Not on file  Intimate Partner Violence: Not on file    Family History  Problem Relation Age of Onset  . Hypertension Mother   . Liver disease Father     Past Surgical History:  Procedure Laterality Date  . AV FISTULA PLACEMENT Right 03/15/2019   Procedure: RIGHT ARM ARTERIOVENOUS (AV) FISTULA CREATION;  Surgeon: Angelia Mould, MD;  Location: [facility];  Service: Vascular;  Laterality: Right;  . COLONOSCOPY    . COLONOSCOPY W/ BIOPSIES AND POLYPECTOMY    . CORONARY STENT PLACEMENT  2007  . IMPLANTABLE CARDIOVERTER DEFIBRILLATOR IMPLANT  2015  . SUBQ ICD CHANGEOUT N/A 10/13/2020   Procedure: SUBQ ICD CHANGEOUT;  Surgeon: Evans Lance, MD;  Location: [facility];  Service: Cardiovascular;  Laterality: N/A;    ROS: Review of  Systems Negative except as stated above  PHYSICAL EXAM: BP 121/78   Pulse 81   Resp 16   Wt 262 lb 12.8 oz (119.2 kg)   SpO2 95%   BMI 41.16 kg/m   Wt Readings from Last 3 Encounters:  02/06/21 262 lb 12.8 oz (119.2 kg)  01/15/21 265 lb (120.2 kg)  01/11/21 266 lb (120.7 kg)    Physical Exam  General appearance - alert, well appearing, and in no distress Mental status - normal mood, behavior, speech, dress, motor activity, and thought processes Neck - supple, no significant adenopathy Chest - clear to auscultation, no wheezes, rales or rhonchi, symmetric air entry Heart -regular rate rhythm Musculoskeletal -no tenderness on palpation of the lumbar spine or surrounding paraspinal muscles.  Power in his lower extremities 5/5 bilaterally.   Extremities -no lower extremity edema.  Dialysis graft is in his right upper extremity and has a good thrill.  Lab Results  Component Value Date   CALCIUM 8.6 (L) 01/11/2021   CAION 1.27 10/13/2020   PHOS 2.6 04/10/2017    CMP Latest Ref Rng & Units 01/11/2021 01/11/2021 12/09/2020  Glucose 70 - 99 mg/dL - 124(H) 101(H)  BUN 6 - 20 mg/dL - 81(H) 75(H)  Creatinine 0.61 - 1.24 mg/dL 13.01(H) 12.90(H) 10.63(H)  Sodium 135 - 145 mmol/L - 142 139  Potassium 3.5 - 5.1 mmol/L - 4.2 3.7  Chloride 98 - 111 mmol/L - 108 106  CO2 22 - 32 mmol/L - 18(L) 17(L)  Calcium 8.9 - 10.3 mg/dL - 8.6(L) 8.2(L)  Total Protein 6.5 - 8.1 g/dL - - -  Total Bilirubin 0.3 - 1.2 mg/dL - - -  Alkaline Phos 38 - 126 U/L - - -  AST 15 - 41 U/L - - -  ALT 0 - 44 U/L - - -   Lipid Panel     Component Value Date/Time   CHOL 182 03/05/2016 0503   TRIG 118 03/05/2016 0503   HDL 38 (L) 03/05/2016 0503   CHOLHDL 4.8 03/05/2016 0503   VLDL 24 03/05/2016 0503   LDLCALC 120 (H) 03/05/2016 0503    CBC    Component Value Date/Time   WBC 5.6 01/11/2021 2105   RBC 3.78 (L) 01/11/2021 2105   HGB 11.0 (L) 01/11/2021 2105  HGB 11.4 (L) 10/10/2020 0925   HCT 34.7 (L)  01/11/2021 2105   HCT 34.8 (L) 10/10/2020 0925   PLT 214 01/11/2021 2105   PLT 179 10/10/2020 0925   MCV 91.8 01/11/2021 2105   MCV 90 10/10/2020 0925   MCH 29.1 01/11/2021 2105   MCHC 31.7 01/11/2021 2105   RDW 15.7 (H) 01/11/2021 2105   RDW 16.1 (H) 10/10/2020 0925   LYMPHSABS 0.9 10/10/2020 0925   MONOABS 0.1 09/22/2020 0440   EOSABS 0.3 10/10/2020 0925   BASOSABS 0.0 10/10/2020 0925    ASSESSMENT AND PLAN: 1. Essential hypertension At goal.  Continue current medications.  2. Chronic combined systolic and diastolic CHF (congestive heart failure) ([place]) Compensated.  He will be establishing with the cardiologist Dr. Aundra Dubin next week.  3. ESRD on dialysis [facility]) Going to dialysis Tuesday Thursdays and Saturdays.  4. Chronic obstructive pulmonary disease, unspecified COPD type ([place]) Stable.  Continue Symbicort  5. Chronic bilateral low back pain without sciatica Likely due to worsening arthritis or degenerative disks. I wanted to check a PSA level but patient declined stating he will do it on a subsequent visit. Due to his kidney issue we cannot prescribe NSAIDs.  I have prescribed some tramadol to use as needed.  Went over with him that it is a controlled substance that can cause drowsiness and sometimes constipation.  People can also sometimes become dependent or addicted to the medication.  He is agreeable to entering into a controlled substance prescribing agreement with [place].  [place] controlled substance reporting system reviewed. - traMADol (ULTRAM) 50 MG tablet; Take 1 tablet (50 mg total) by mouth 2 (two) times daily as needed.  Dispense: 60 tablet; Refill: 1 - DG Lumbar Spine Complete; Future  6. Controlled substance agreement signed - [id number] 11+Oxyco+Alc+Crt-Bund  7. Body mass index (BMI) 40.0-44.9, adult (HCC) Encourage healthy eating habits.    Patient was given the opportunity to ask questions.  Patient verbalized understanding of the plan and was able to  repeat key elements of the plan.   No orders of the defined types were placed in this encounter.    Requested Prescriptions    No prescriptions requested or ordered in this encounter    No follow-ups on file.  Karle Plumber, MD, FACP

## 2021-02-07 LAB — DRUG SCREEN 764883 11+OXYCO+ALC+CRT-BUND
Amphetamines, Urine: NEGATIVE ng/mL
BENZODIAZ UR QL: NEGATIVE ng/mL
Barbiturate: NEGATIVE ng/mL
Cannabinoid Quant, Ur: NEGATIVE ng/mL
Cocaine (Metabolite): NEGATIVE ng/mL
Creatinine: 171.4 mg/dL (ref 20.0–300.0)
Ethanol: NEGATIVE %
Meperidine: NEGATIVE ng/mL
Methadone Screen, Urine: NEGATIVE ng/mL
OPIATE SCREEN URINE: NEGATIVE ng/mL
Oxycodone/Oxymorphone, Urine: NEGATIVE ng/mL
Phencyclidine: NEGATIVE ng/mL
Propoxyphene: NEGATIVE ng/mL
Tramadol: NEGATIVE ng/mL
pH, Urine: 7.8 (ref 4.5–8.9)

## 2021-02-09 MED FILL — ETHYL CHLORIDE AERO: 1 days supply | Qty: 116 | Fill #1

## 2021-02-10 ENCOUNTER — Other Ambulatory Visit: Payer: Self-pay | Admitting: Internal Medicine

## 2021-02-10 DIAGNOSIS — E785 Hyperlipidemia, unspecified: Secondary | ICD-10-CM

## 2021-02-10 DIAGNOSIS — I2510 Atherosclerotic heart disease of native coronary artery without angina pectoris: Secondary | ICD-10-CM

## 2021-02-10 NOTE — Telephone Encounter (Signed)
Requested medication (s) are due for refill today: yes  Requested medication (s) are on the active medication list: yes  Last refill:  02/02/21  Future visit scheduled: yes  Notes to clinic:  overdue for blood work   Requested Prescriptions  Pending Prescriptions Disp Refills   atorvastatin (LIPITOR) 40 MG tablet [Pharmacy Med Name: ATORVASTATIN CALCIUM 40 MG 40 Tablet] 7 tablet 0    Sig: TAKE 1 TABLET (40 MG TOTAL) BY MOUTH AT BEDTIME.      Cardiovascular:  Antilipid - Statins Failed - 02/10/2021  9:18 AM      Failed - Total Cholesterol in normal range and within 360 days    Cholesterol  Date Value Ref Range Status  03/05/2016 182 0 - 200 mg/dL Final          Failed - LDL in normal range and within 360 days    LDL Cholesterol  Date Value Ref Range Status  03/05/2016 120 (H) 0 - 99 mg/dL Final    Comment:           Total Cholesterol/HDL:CHD Risk Coronary Heart Disease Risk Table                     Men   Women  1/2 Average Risk   3.4   3.3  Average Risk       5.0   4.4  2 X Average Risk   9.6   7.1  3 X Average Risk  23.4   11.0        Use the calculated Patient Ratio above and the CHD Risk Table to determine the patient's CHD Risk.        ATP III CLASSIFICATION (LDL):  <100     mg/dL   Optimal  100-129  mg/dL   Near or Above                    Optimal  130-159  mg/dL   Borderline  160-189  mg/dL   High  >190     mg/dL   Very High           Failed - HDL in normal range and within 360 days    HDL  Date Value Ref Range Status  03/05/2016 38 (L) >40 mg/dL Final          Failed - Triglycerides in normal range and within 360 days    Triglycerides  Date Value Ref Range Status  03/05/2016 118 <150 mg/dL Final          Passed - Patient is not pregnant      Passed - Valid encounter within last 12 months    Recent Outpatient Visits           4 days ago Essential hypertension   [place], [place]   1 month ago  COVID-19 virus infection   [place], [place]   4 months ago Hospital discharge follow-up   [place], [place]   7 months ago Hospital discharge follow-up   [place], MD   10 months ago Severe persistent asthma with acute exacerbation   [place], MD       Future Appointments             In 3 months [name],  Dalbert Batman, MD [place]

## 2021-02-11 ENCOUNTER — Other Ambulatory Visit: Payer: Self-pay | Admitting: Internal Medicine

## 2021-02-12 MED FILL — ATORVASTATIN CALCIUM 40 MG: 40 | 30 days supply | Qty: 30 | Fill #0

## 2021-02-13 ENCOUNTER — Encounter (HOSPITAL_COMMUNITY): Payer: Self-pay | Admitting: Cardiology

## 2021-02-23 MED FILL — TORSEMIDE 20 MG TABLET: 20 | 15 days supply | Qty: 60 | Fill #1

## 2021-03-04 ENCOUNTER — Other Ambulatory Visit: Payer: Self-pay | Admitting: Internal Medicine

## 2021-03-04 DIAGNOSIS — I5042 Chronic combined systolic (congestive) and diastolic (congestive) heart failure: Secondary | ICD-10-CM

## 2021-03-04 DIAGNOSIS — I2510 Atherosclerotic heart disease of native coronary artery without angina pectoris: Secondary | ICD-10-CM

## 2021-03-04 MED FILL — ISOSORBIDE MN ER 30 MG TAB: 30 | 30 days supply | Qty: 30 | Fill #0

## 2021-03-04 NOTE — Telephone Encounter (Signed)
Requested Prescriptions  Pending Prescriptions Disp Refills  . isosorbide mononitrate (IMDUR) 30 MG 24 hr tablet [Pharmacy Med Name: ISOSORBIDE MN ER 30 MG TAB 30 Tablet] 90 tablet 0    Sig: TAKE 1 TABLET (30 MG TOTAL) BY MOUTH DAILY.     Cardiovascular:  Nitrates Passed - 03/04/2021  7:09 AM      Passed - Last BP in normal range    BP Readings from Last 1 Encounters:  02/06/21 121/78         Passed - Last Heart Rate in normal range    Pulse Readings from Last 1 Encounters:  02/06/21 81         Passed - Valid encounter within last 12 months    Recent Outpatient Visits          3 weeks ago Essential hypertension   [place], [place]   1 month ago COVID-19 virus infection   [place], [place], [place]   5 months ago Hospital discharge follow-up   [place], MD   8 months ago Hospital discharge follow-up   [place], MD   11 months ago Severe persistent asthma with acute exacerbation   [place], MD      Future Appointments            In 3 months [name] Dalbert Batman, MD [place]

## 2021-03-07 ENCOUNTER — Other Ambulatory Visit: Payer: Self-pay

## 2021-03-09 MED FILL — Amlodipine Besylate Tab 10 MG (Base Equivalent): ORAL | 30 days supply | Qty: 30 | Fill #0 | Status: AC

## 2021-03-10 ENCOUNTER — Other Ambulatory Visit: Payer: Self-pay

## 2021-03-16 ENCOUNTER — Ambulatory Visit (INDEPENDENT_AMBULATORY_CARE_PROVIDER_SITE_OTHER): Payer: Medicare Other

## 2021-03-16 DIAGNOSIS — I255 Ischemic cardiomyopathy: Secondary | ICD-10-CM | POA: Diagnosis not present

## 2021-03-16 MED FILL — Ethyl Chloride Aerosol Spray: CUTANEOUS | 30 days supply | Qty: 116 | Fill #0 | Status: CN

## 2021-03-17 ENCOUNTER — Other Ambulatory Visit (HOSPITAL_COMMUNITY): Payer: Self-pay

## 2021-03-17 ENCOUNTER — Other Ambulatory Visit: Payer: Self-pay

## 2021-03-17 MED FILL — Ethyl Chloride Aerosol Spray: CUTANEOUS | 30 days supply | Qty: 116 | Fill #0 | Status: AC

## 2021-03-18 LAB — CUP PACEART REMOTE DEVICE CHECK
Battery Remaining Percentage: 95 %
Date Time Interrogation Session: 20220410140800
Implantable Lead Implant Date: 20150507
Implantable Lead Implant Date: 20150507
Implantable Lead Location: 753862
Implantable Lead Location: 753862
Implantable Lead Model: 3400
Implantable Lead Model: 3400
Implantable Pulse Generator Implant Date: 20211108
Pulse Gen Serial Number: 146303

## 2021-03-22 MED FILL — Atorvastatin Calcium Tab 40 MG (Base Equivalent): ORAL | 30 days supply | Qty: 30 | Fill #0 | Status: CN

## 2021-03-23 ENCOUNTER — Encounter (HOSPITAL_COMMUNITY): Payer: Self-pay | Admitting: Cardiology

## 2021-03-23 ENCOUNTER — Other Ambulatory Visit: Payer: Self-pay

## 2021-03-26 ENCOUNTER — Other Ambulatory Visit: Payer: Self-pay

## 2021-03-26 MED FILL — Atorvastatin Calcium Tab 40 MG (Base Equivalent): ORAL | 30 days supply | Qty: 30 | Fill #0 | Status: AC

## 2021-03-27 NOTE — Progress Notes (Signed)
Remote ICD transmission.   

## 2021-03-29 MED FILL — B-Complex w/ C & Folic Acid Tab 0.8 MG: ORAL | 30 days supply | Qty: 30 | Fill #0 | Status: CN

## 2021-03-30 ENCOUNTER — Other Ambulatory Visit: Payer: Self-pay

## 2021-03-31 ENCOUNTER — Other Ambulatory Visit: Payer: Self-pay

## 2021-03-31 MED ORDER — RENA-VITE PO TABS
ORAL_TABLET | ORAL | 10 refills | Status: DC
  Filled 2021-03-31: qty 30, 30d supply, fill #0
  Filled 2021-05-10: qty 30, 30d supply, fill #1

## 2021-04-09 ENCOUNTER — Other Ambulatory Visit: Payer: Self-pay

## 2021-04-12 MED FILL — Ethyl Chloride Aerosol Spray: CUTANEOUS | 30 days supply | Qty: 116 | Fill #1 | Status: AC

## 2021-04-13 ENCOUNTER — Other Ambulatory Visit: Payer: Self-pay

## 2021-04-13 MED FILL — Carvedilol Tab 25 MG: ORAL | 30 days supply | Qty: 60 | Fill #0 | Status: AC

## 2021-04-13 MED FILL — Isosorbide Mononitrate Tab ER 24HR 30 MG: ORAL | 30 days supply | Qty: 30 | Fill #0 | Status: AC

## 2021-04-20 ENCOUNTER — Other Ambulatory Visit: Payer: Self-pay

## 2021-04-20 MED FILL — Ethyl Chloride Aerosol Spray: CUTANEOUS | 30 days supply | Qty: 116 | Fill #2 | Status: CN

## 2021-04-21 ENCOUNTER — Other Ambulatory Visit: Payer: Self-pay

## 2021-04-21 MED FILL — Ethyl Chloride Aerosol Spray: CUTANEOUS | 30 days supply | Qty: 116 | Fill #2 | Status: AC

## 2021-04-21 MED FILL — Ethyl Chloride Aerosol Spray: CUTANEOUS | 30 days supply | Qty: 116 | Fill #3 | Status: CN

## 2021-04-23 ENCOUNTER — Other Ambulatory Visit: Payer: Self-pay

## 2021-05-06 MED FILL — Atorvastatin Calcium Tab 40 MG (Base Equivalent): ORAL | 30 days supply | Qty: 30 | Fill #1 | Status: AC

## 2021-05-07 ENCOUNTER — Other Ambulatory Visit: Payer: Self-pay

## 2021-05-07 MED FILL — Ethyl Chloride Aerosol Spray: CUTANEOUS | 30 days supply | Qty: 116 | Fill #3 | Status: CN

## 2021-05-11 ENCOUNTER — Other Ambulatory Visit: Payer: Self-pay

## 2021-05-11 ENCOUNTER — Other Ambulatory Visit (HOSPITAL_COMMUNITY): Payer: Self-pay | Admitting: *Deleted

## 2021-05-11 ENCOUNTER — Ambulatory Visit (HOSPITAL_COMMUNITY)
Admission: RE | Admit: 2021-05-11 | Discharge: 2021-05-11 | Disposition: A | Discharge status: Home / Self Care | Payer: Medicare Other | Source: Ambulatory Visit | Attending: Cardiology | Admitting: Cardiology

## 2021-05-11 VITALS — HR 75 | Ht 67.0 in | Wt 256.8 lb

## 2021-05-11 DIAGNOSIS — Z8616 Personal history of COVID-19: Secondary | ICD-10-CM | POA: Insufficient documentation

## 2021-05-11 DIAGNOSIS — Z7951 Long term (current) use of inhaled steroids: Secondary | ICD-10-CM | POA: Diagnosis not present

## 2021-05-11 DIAGNOSIS — N186 End stage renal disease: Secondary | ICD-10-CM | POA: Diagnosis not present

## 2021-05-11 DIAGNOSIS — Z79899 Other long term (current) drug therapy: Secondary | ICD-10-CM | POA: Diagnosis not present

## 2021-05-11 DIAGNOSIS — E785 Hyperlipidemia, unspecified: Secondary | ICD-10-CM | POA: Insufficient documentation

## 2021-05-11 DIAGNOSIS — Z955 Presence of coronary angioplasty implant and graft: Secondary | ICD-10-CM | POA: Diagnosis not present

## 2021-05-11 DIAGNOSIS — G4733 Obstructive sleep apnea (adult) (pediatric): Secondary | ICD-10-CM | POA: Diagnosis not present

## 2021-05-11 DIAGNOSIS — Z8249 Family history of ischemic heart disease and other diseases of the circulatory system: Secondary | ICD-10-CM | POA: Insufficient documentation

## 2021-05-11 DIAGNOSIS — Z95810 Presence of automatic (implantable) cardiac defibrillator: Secondary | ICD-10-CM | POA: Diagnosis not present

## 2021-05-11 DIAGNOSIS — I5022 Chronic systolic (congestive) heart failure: Secondary | ICD-10-CM | POA: Insufficient documentation

## 2021-05-11 DIAGNOSIS — I132 Hypertensive heart and chronic kidney disease with heart failure and with stage 5 chronic kidney disease, or end stage renal disease: Secondary | ICD-10-CM | POA: Diagnosis not present

## 2021-05-11 DIAGNOSIS — Z992 Dependence on renal dialysis: Secondary | ICD-10-CM | POA: Insufficient documentation

## 2021-05-11 DIAGNOSIS — I447 Left bundle-branch block, unspecified: Secondary | ICD-10-CM | POA: Insufficient documentation

## 2021-05-11 DIAGNOSIS — I5042 Chronic combined systolic (congestive) and diastolic (congestive) heart failure: Secondary | ICD-10-CM

## 2021-05-11 DIAGNOSIS — Z8674 Personal history of sudden cardiac arrest: Secondary | ICD-10-CM | POA: Insufficient documentation

## 2021-05-11 DIAGNOSIS — I2510 Atherosclerotic heart disease of native coronary artery without angina pectoris: Secondary | ICD-10-CM | POA: Insufficient documentation

## 2021-05-11 DIAGNOSIS — I472 Ventricular tachycardia, unspecified: Secondary | ICD-10-CM

## 2021-05-11 DIAGNOSIS — I255 Ischemic cardiomyopathy: Secondary | ICD-10-CM | POA: Diagnosis not present

## 2021-05-11 DIAGNOSIS — Z7982 Long term (current) use of aspirin: Secondary | ICD-10-CM | POA: Insufficient documentation

## 2021-05-11 LAB — LIPID PANEL
Cholesterol: 142 mg/dL (ref 0–200)
HDL: 36 mg/dL — ABNORMAL LOW (ref >40)
LDL Cholesterol: 75 mg/dL (ref 0–99)
Total CHOL/HDL Ratio: 3.9 RATIO
Triglycerides: 154 mg/dL — ABNORMAL HIGH (ref <150)
VLDL: 31 mg/dL (ref 0–40)

## 2021-05-11 MED ORDER — HYDRALAZINE HCL 25 MG PO TABS
37.5000 mg | ORAL_TABLET | Freq: Two times a day (BID) | ORAL | 3 refills | Status: DC
  Filled 2021-05-11: qty 90, 30d supply, fill #0

## 2021-05-11 MED ORDER — RENA-VITE PO TABS
1.0000 | ORAL_TABLET | Freq: Every day | ORAL | 11 refills | Status: AC
  Filled 2021-05-11 – 2022-01-07 (×3): qty 30, 30d supply, fill #0
  Filled 2022-01-07: qty 30, 30d supply, fill #1

## 2021-05-11 NOTE — Progress Notes (Signed)
PCP: [name], MD Cardiology: Dr. Radford Pax HF cardiology: Dr. Aundra Dubin  60 y.o. with history of CAD, ischemic CMP, and ESRD was referred by Dr. Radford Pax for evaluation of CHF.  He had PCI in 2585, uncertain what vessel was involved.  He has not had coronary angiography since that time due to advanced CKD.  He has had ischemic cardiomyopathy x years.  Most recent echo in 2/22 showed EF 25-30% with moderate LV dilation and normal RV.  He had VT arrest in 2015, has St Jude subcutaneous ICD.  [name] in 2/22.    He feels like his breathing has improved with HD.  No exertional dyspnea currently.  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  No chest pain unless fluid is pulled off too fast at HD (no exertional CP).  No lightheadedness.  BP will occasionally drop at HD, he does not take his cardiac meds the morning before HD.  He was turned down for renal transplant at [facility] due to cardiomyopathy.  He wants to know what he can do to get his EF up so that he can get a renal transplant.   ECG (personally reviewed): NSR, IVCD 144 msec  PMH: 1. CAD: PCI in 2778, uncertain what vessel.  - Cardiolite 2017: Inferior and inferolateral large fixed defect suggestive of prior infarction, EF 28%.  2. Chronic systolic CHF: Ischemic cardiomyopathy. St Jude subcutaneous ICD.  - Echo (7/20) with EF 25-30%.  - Echo (10/20) with EF 40-45% - Echo (2/22): EF 25-30%, moderate LV dilation, mild LVH, normal RV size and systolic function, mild MR.  3. ESRD since 2/22 4. OSA on CPAP 5. H/o VT arrest: Subcutaneous St Jude ICD placed in 2015 at [facility].  6. COVID-19 1/22.  7. HTN 8. Hyperlipidemia  Social History   Socioeconomic History  . Marital status: Single    Spouse name: Not on file  . Number of children: Not on file  . Years of education: Not on file  . Highest education level: Not on file  Occupational History  . Not on file  Tobacco Use  . Smoking status: Never Smoker  .  Smokeless tobacco: Never Used  Vaping Use  . Vaping Use: Never used  Substance and Sexual Activity  . Alcohol use: No  . Drug use: No  . Sexual activity: Yes  Other Topics Concern  . Not on file  Social History Narrative   ** Merged History Encounter **       Social Determinants of Health   Financial Resource Strain: Not on file  Food Insecurity: Not on file  Transportation Needs: Not on file  Physical Activity: Not on file  Stress: Not on file  Social Connections: Not on file  Intimate Partner Violence: Not on file   Family History  Problem Relation Age of Onset  . Hypertension Mother   . Liver disease Father    ROS: All systems reviewed and negative except as per HPI.   Current Outpatient Medications  Medication Sig Dispense Refill  . acetaminophen (TYLENOL) 500 MG tablet Take by mouth as needed.    Marland Kitchen albuterol (PROVENTIL) (2.5 MG/3ML) 0.083% nebulizer solution TAKE 3 MLS (2.5 MG TOTAL) BY NEBULIZATION EVERY 6 (SIX) HOURS AS NEEDED FOR WHEEZING OR SHORTNESS OF BREATH. 75 mL 12  . albuterol (VENTOLIN HFA) 108 (90 Base) MCG/ACT inhaler Inhale 2 puffs into the lungs every 4 (four) hours as needed for wheezing or shortness of breath. 8  g 6  . aspirin EC 81 MG tablet Take 1 tablet (81 mg total) by mouth daily. 30 tablet 5  . atorvastatin (LIPITOR) 40 MG tablet TAKE 1 TABLET (40 MG TOTAL) BY MOUTH AT BEDTIME. 30 tablet 2  . AURYXIA 1 GM 210 MG(Fe) tablet Take 420 mg by mouth 3 (three) times daily.    . budesonide-formoterol (SYMBICORT) 80-4.5 MCG/ACT inhaler Inhale 2 puffs into the lungs 2 (two) times daily. (Patient taking differently: Inhale 2 puffs into the lungs as needed (wheezing).) 1 Inhaler 6  . calcitRIOL (ROCALTROL) 0.5 MCG capsule TAKE 1 CAPSULE BY MOUTH ONCE DAILY 30 capsule 11  . carvedilol (COREG) 25 MG tablet TAKE 1 TABLET (25 MG TOTAL) BY MOUTH 2 (TWO) TIMES DAILY WITH A MEAL. 180 tablet 1  . cetirizine (ZYRTEC) 10 MG tablet TAKE 1 TABLET (10 MG TOTAL) BY MOUTH AT  BEDTIME. 30 tablet 0  . Cholecalciferol (VITAMIN D-3) 125 MCG (5000 UT) TABS Take 5,000 Units by mouth daily.     Marland Kitchen doxercalciferol (HECTOROL) 0.5 MCG capsule Doxercalciferol (Hectorol)    . EPINEPHrine 0.3 mg/0.3 mL IJ SOAJ injection Inject 0.3 mg into the muscle once as needed for up to 1 dose (if worsening tongue swelling, SOB, hypoxia, or other concerns for progressive anaphylaxis). 1 each 0  . ethyl chloride spray SPRAY 1 SPRAY TO SKIN THREE TIMES A WEEK AS DIRECTED SPRAY A SMALL AMOUNT JUST PRIOR TO NEEDLE INSERTION 116 mL 11  . fluticasone (FLONASE) 50 MCG/ACT nasal spray Place 1 spray into both nostrils daily. (Patient taking differently: Place 1 spray into both nostrils as needed.) 16 g 2  . iron sucrose in sodium chloride 0.9 % 100 mL Iron Sucrose (Venofer)    . isosorbide mononitrate (IMDUR) 30 MG 24 hr tablet TAKE 1 TABLET (30 MG TOTAL) BY MOUTH DAILY. 90 tablet 0  . loratadine (CLARITIN) 10 MG tablet Take by mouth.    . multivitamin (RENA-VIT) TABS tablet TAKE 1 TABLET BY MOUTH ONCE DAILY 30 tablet 11  . NON FORMULARY Heparin Sodium (Porcine) 1,000 Units/mL Systemic    . traMADol (ULTRAM) 50 MG tablet TAKE 1 TABLET (50 MG TOTAL) BY MOUTH 2 (TWO) TIMES DAILY AS NEEDED. 60 tablet 1  . Tuberculin PPD (TUBERSOL ID) Inject into the skin.    Marland Kitchen EPINEPHrine 0.3 mg/0.3 mL IJ SOAJ injection Inject 0.3 mLs (0.3 mg total) into the muscle once. 1 Device 0  . hydrALAZINE (APRESOLINE) 25 MG tablet Take 1.5 tablets (37.5 mg total) by mouth 2 (two) times daily. 90 tablet 3   No current facility-administered medications for this encounter.   Pulse 75   Ht 5\' 7"  (1.702 m)   Wt 116.5 kg (256 lb 12.8 oz)   BMI 40.22 kg/m  General: NAD Neck: No JVD, no thyromegaly or thyroid nodule.  Lungs: Clear to auscultation bilaterally with normal respiratory effort. CV: Nondisplaced PMI.  Heart regular S1/S2, no S3/S4, no murmur.  No peripheral edema.  No carotid bruit.  Normal pedal pulses.  Abdomen: Soft,  nontender, no hepatosplenomegaly, no distention.  Skin: Intact without lesions or rashes.  Neurologic: Alert and oriented x 3.  Psych: Normal affect. Extremities: No clubbing or cyanosis.  HEENT: Normal.   Assessment/Plan: 1. CAD: PCI in 2005, unsure what vessel.  No coronary angiography since that time due to advanced CKD, now on HD.  Echo in 2/22 with EF down to 25-30%. No chest pain except when fluid is pulled too fast at HD.   -  Continue atorvastatin, check lipids today.  - Continue ASA 81 daily.  - I think that he needs coronary angiography to reassess coronary system now that he is committed to HD, ?interventional option to improve LV function by CABG or PCI.  We discussed risks/benefits of procedure and he agrees to it, wants to wait until later in summer.  2. ESRD: Since 2/22.  Turned down for renal transplant by [facility] due to low EF.  3. Chronic systolic CHF: Ischemic cardiomyopathy, echo in 2/22 with EF 25-30%, moderate LV dilation, mild LVH, normal RV size and systolic function, mild MR.   He has a Research officer, political party subcutaneous ICD.  He is not volume overloaded on exam.  Medication titration limited by ESRD, need to avoid hypotension at HD.  - Continue Coreg 25 mg bid.  - Increase hydralazine to 37.5 mg tid and continue Imdur 30 daily.  - As above, will plan LHC/RHC.  Need to see if he has coronary targets for revascularization that could lead to improvement in LV function.  - ECG with IVCD 144 msec, thought not to be good candidate for CRT upgrade (QRS < 150 msec).  Can re-address with Dr. Rayann Heman.  4. OSA: Continue CPAP.   Followup in 1 month.   Victor Thompson 05/11/2021

## 2021-05-11 NOTE — Patient Instructions (Signed)
Increase Hydralazine to 37.5 mg (1 & 1/2 tabs) Twice daily   Labs done today, we will call you for abnormal results  Heart catheterization on Wed 07/08/21, see instructions below  Your physician recommends that you schedule a follow-up appointment in: after catheterization  If you have any questions or concerns before your next appointment please send [place] a message through [facility] or call our office at [phone].    TO LEAVE A MESSAGE FOR THE NURSE SELECT OPTION 2, PLEASE LEAVE A MESSAGE INCLUDING: . YOUR NAME . DATE OF BIRTH . CALL BACK NUMBER . REASON FOR CALL**this is important as we prioritize the call backs  [place] AS LONG AS YOU CALL BEFORE 4:00 PM  At the [facility], you and your health needs are our priority. As part of our continuing mission to provide you with exceptional heart care, we have created designated Provider Care Teams. These Care Teams include your primary Cardiologist (physician) and Advanced Practice Providers (APPs- Physician Assistants and Nurse Practitioners) who all work together to provide you with the care you need, when you need it.   You may see any of the following providers on your designated Care Team at your next follow up: [place] Kitchen Dr Glori Bickers . Dr Loralie Champagne . Dr Vickki Muff . Darrick Grinder, NP . Lyda Jester, [place] . Audry Riles, PharmD   Please be sure to bring in all your medications bottles to every appointment.      CATHETERIZATION INSTRUCTIONS  You are scheduled for a Cardiac Catheterization on Wednesday, August 3 with Dr. Loralie Champagne.  1. Please arrive at the [facility] (Main Entrance A) at [name]: [address] [place], [place] [address] at 8:00 AM (This time is two hours before your procedure to ensure your preparation). Free valet parking service is available.   Special note: Every effort is made to have your procedure done on time.  Please understand that emergencies sometimes delay scheduled procedures.  2. Diet: Do not eat solid foods after midnight.  The patient may have clear liquids until 5am upon the day of the procedure.  3. Medication instructions in preparation for your procedure:   TAKE ALL MEDICATIONS AS PRESCRIBED  On the morning of your procedure, take your Aspirin and any morning medicines NOT listed above.  You may use sips of water.  5. Plan for one night stay--bring personal belongings. 6. Bring a current list of your medications and current insurance cards. 7. You MUST have a responsible person to drive you home. 8. Someone MUST be with you the first 24 hours after you arrive home or your discharge will be delayed. 9. Please wear clothes that are easy to get on and off and wear slip-on shoes.  Thank you for allowing [place] to care for you!   -- [facility]

## 2021-05-12 ENCOUNTER — Other Ambulatory Visit: Payer: Self-pay

## 2021-05-12 ENCOUNTER — Encounter (INDEPENDENT_AMBULATORY_CARE_PROVIDER_SITE_OTHER): Payer: Self-pay

## 2021-05-12 MED FILL — Ethyl Chloride Aerosol Spray: CUTANEOUS | 30 days supply | Qty: 116 | Fill #3 | Status: AC

## 2021-05-14 ENCOUNTER — Other Ambulatory Visit: Payer: Self-pay

## 2021-05-22 ENCOUNTER — Telehealth: Payer: Self-pay | Admitting: Internal Medicine

## 2021-05-22 DIAGNOSIS — G4733 Obstructive sleep apnea (adult) (pediatric): Secondary | ICD-10-CM

## 2021-05-22 NOTE — Telephone Encounter (Signed)
Copied from [place] [phone]. Topic: General - Other >> May 22, 2021  1:23 PM Pawlus, Victor Thompson wrote: Reason for CRM: Pt wanted to let Dr [name] know that he needs Thompson new CPAP machine, please advise.

## 2021-05-26 ENCOUNTER — Telehealth: Payer: Self-pay | Admitting: Internal Medicine

## 2021-05-26 DIAGNOSIS — G4733 Obstructive sleep apnea (adult) (pediatric): Secondary | ICD-10-CM

## 2021-05-26 NOTE — Telephone Encounter (Signed)
Left message for patient to call back  

## 2021-05-27 MED FILL — Isosorbide Mononitrate Tab ER 24HR 30 MG: ORAL | 30 days supply | Qty: 30 | Fill #1 | Status: AC

## 2021-05-27 MED FILL — Ethyl Chloride Aerosol Spray: CUTANEOUS | 30 days supply | Qty: 116 | Fill #4 | Status: AC

## 2021-05-28 ENCOUNTER — Other Ambulatory Visit: Payer: Self-pay

## 2021-05-28 NOTE — Telephone Encounter (Signed)
Pt returning a phone call. Pt can be reached at [phone]

## 2021-05-28 NOTE — Telephone Encounter (Signed)
Patient last seen 06/01/16 with pending consult for 07/16/2021.  Patient is requesting an order for new cpap, as his current machine is broken. Patient is aware that an appt is needed prior to ordering a new machine. Patient is requesting a new machine prior to upcoming appt.   Dr. Annamaria Boots, please advise. Thanks

## 2021-05-28 NOTE — Telephone Encounter (Signed)
Call placed to patient.  He said that he has contacted Dr Annamaria Boots about the need for a new CPAP machine.  He has an appointment with Dr Annamaria Boots 07/16/2021 but is concerned about sleeping without a machine in the meantime.   He's had the current machine for many years and did not remember where he got it from. He has been getting the CPAP supplies from [place].   He has been approved for disability and is now interested in Section 8 housing.  Provided him with the phone number for [employer].

## 2021-05-29 ENCOUNTER — Telehealth: Payer: Self-pay

## 2021-05-29 NOTE — Telephone Encounter (Signed)
Latitude alert received for 1 AF event on 04/27/21 with unknown duration. No hx of AF or [place] on file. Routing to Dr. Rayann Heman for review and recommendations.

## 2021-06-02 NOTE — Telephone Encounter (Signed)
Phone call placed to patient today.  Patient states that his CPAP machine gave out about 2 weeks ago and since then he has not been able to sleep well and feels his OSA is not controlled as it was when he was using the CPAP.  He has an appointment coming up with Dr. Annamaria Boots in August but does not want to wait until then to get another device.  As far as he remembers, the setting on his device remained the same since it was first ordered by Dr. Annamaria Boots in 2017.  He had ordered AutoPap 5-20.  Patient gets his supplies through [facility].  I told him that I will go ahead and send a prescription for the CPAP machine to [facility].

## 2021-06-02 NOTE — Telephone Encounter (Signed)
Ok to try sending order to his DME to please replace broken old machine, mask of choice, humidifier, auto 5-20, supplies, AirView/ card

## 2021-06-03 NOTE — Telephone Encounter (Signed)
Spoke with the pt and notified order sent for CPAP. He is aware of national back order on CPAP and DME will keep him informed on updated on when he can get machine.

## 2021-06-03 NOTE — Telephone Encounter (Signed)
DME referral placed for replacement CPAP. Called the pt and had to [facility].

## 2021-06-03 NOTE — Telephone Encounter (Signed)
Attempted to call pt but unable to reach. Left message for him to return call. °

## 2021-06-07 NOTE — Telephone Encounter (Signed)
Single episode of afib noted.  Given renal failure, I am reluctant to start B[place] at this time.   I will alert Dr Aundra Dubin.  We will continue to follow. He was in sinus on follow-up with Dr Aundra Dubin 05/11/21.

## 2021-06-09 ENCOUNTER — Ambulatory Visit: Payer: Medicare Other | Attending: Internal Medicine | Admitting: Internal Medicine

## 2021-06-09 ENCOUNTER — Other Ambulatory Visit: Payer: Self-pay

## 2021-06-09 DIAGNOSIS — I10 Essential (primary) hypertension: Secondary | ICD-10-CM | POA: Diagnosis not present

## 2021-06-09 DIAGNOSIS — G4733 Obstructive sleep apnea (adult) (pediatric): Secondary | ICD-10-CM | POA: Diagnosis not present

## 2021-06-09 DIAGNOSIS — I255 Ischemic cardiomyopathy: Secondary | ICD-10-CM | POA: Diagnosis not present

## 2021-06-09 DIAGNOSIS — J449 Chronic obstructive pulmonary disease, unspecified: Secondary | ICD-10-CM | POA: Diagnosis not present

## 2021-06-09 MED ORDER — CALCIUM ACETATE (PHOS BINDER) 667 MG PO CAPS
ORAL_CAPSULE | ORAL | 6 refills | Status: DC
  Filled 2021-06-09: qty 180, 30d supply, fill #0

## 2021-06-09 NOTE — Progress Notes (Signed)
Patient ID: Victor Thompson, male   DOB: [birth year], 60 y.o.   MRN: [id number] Virtual Visit via Telephone Note  I connected with Victor Thompson on 06/09/2021 at 2:51 p.m by telephone and verified that I am speaking with the correct person using two identifiers  Location: Patient: home Provider: office  Participants: Myself Patient   I discussed the limitations, risks, security and privacy concerns of performing an evaluation and management service by telephone and the availability of in person appointments. I also discussed with the patient that there may be a patient responsible charge related to this service. The patient expressed understanding and agreed to proceed.   History of Present Illness: Pt with hx of ESRD on hemodialysis (Dr. Joelyn Oms), HTN, CAD (with hx of cardiac arrest), ICD, combined sys/dia CHF (EF 25-30 % 01/2021), HL, OSA, Obese, COPD, chronic lower back pain with multilevel spondylosis.   OSA: we sent rxn to [place] for his CPAP.  However, pt states he was told by [facility] that the manufacturer is back logged for 5 mth.  He is not sure what to do.  ESRD: turned down by [facility] transplant due to low EF.  He saw the cardiologist Dr. Aundra Dubin last month.  He would like to do cardiac catheterization to reassess his coronary system.  Patient agreed to it but decided to put it off until later in the summer.  He continues to go to dialysis 3 days a week.  CAD/HTN/CHF:  taking meds as prescribed.  Limits salt in foods.  Denies any chest pains, PND orthopnea.  No lower extremity edema.  Reports his blood pressure has been good at dialysis.  He does not have a device at home to check blood pressure.  COPD: No recent flares.  Reports using Symbicort as needed rather than BID.  He tells me he uses the Albuterol and Symbicort about 2 x a mth.  HM:  Declines Pneumonia vaccine.  Over due for COVID booster.  He wants to get the booster.  Outpatient Encounter Medications as of 06/09/2021   Medication Sig   acetaminophen (TYLENOL) 500 MG tablet Take by mouth as needed.   albuterol (PROVENTIL) (2.5 MG/3ML) 0.083% nebulizer solution TAKE 3 MLS (2.5 MG TOTAL) BY NEBULIZATION EVERY 6 (SIX) HOURS AS NEEDED FOR WHEEZING OR SHORTNESS OF BREATH.   albuterol (VENTOLIN HFA) 108 (90 Base) MCG/ACT inhaler Inhale 2 puffs into the lungs every 4 (four) hours as needed for wheezing or shortness of breath.   aspirin EC 81 MG tablet Take 1 tablet (81 mg total) by mouth daily.   atorvastatin (LIPITOR) 40 MG tablet TAKE 1 TABLET (40 MG TOTAL) BY MOUTH AT BEDTIME.   AURYXIA 1 GM 210 MG(Fe) tablet Take 420 mg by mouth 3 (three) times daily.   budesonide-formoterol (SYMBICORT) 80-4.5 MCG/ACT inhaler Inhale 2 puffs into the lungs 2 (two) times daily. (Patient taking differently: Inhale 2 puffs into the lungs as needed (wheezing).)   calcitRIOL (ROCALTROL) 0.5 MCG capsule TAKE 1 CAPSULE BY MOUTH ONCE DAILY   calcium acetate (PHOSLO) 667 MG capsule Take 2 capsule by mouth three times a day with meals   carvedilol (COREG) 25 MG tablet TAKE 1 TABLET (25 MG TOTAL) BY MOUTH 2 (TWO) TIMES DAILY WITH A MEAL.   cetirizine (ZYRTEC) 10 MG tablet TAKE 1 TABLET (10 MG TOTAL) BY MOUTH AT BEDTIME.   Cholecalciferol (VITAMIN D-3) 125 MCG (5000 UT) TABS Take 5,000 Units by mouth daily.    doxercalciferol (HECTOROL) 0.5 MCG capsule Doxercalciferol (  Hectorol)   EPINEPHrine 0.3 mg/0.3 mL IJ SOAJ injection Inject 0.3 mLs (0.3 mg total) into the muscle once.   EPINEPHrine 0.3 mg/0.3 mL IJ SOAJ injection Inject 0.3 mg into the muscle once as needed for up to 1 dose (if worsening tongue swelling, SOB, hypoxia, or other concerns for progressive anaphylaxis).   ethyl chloride spray SPRAY 1 SPRAY TO SKIN THREE TIMES A WEEK AS DIRECTED SPRAY A SMALL AMOUNT JUST PRIOR TO NEEDLE INSERTION   fluticasone (FLONASE) 50 MCG/ACT nasal spray Place 1 spray into both nostrils daily. (Patient taking differently: Place 1 spray into both nostrils  as needed.)   hydrALAZINE (APRESOLINE) 25 MG tablet Take 1.5 tablets (37.5 mg total) by mouth 2 (two) times daily.   iron sucrose in sodium chloride 0.9 % 100 mL Iron Sucrose (Venofer)   isosorbide mononitrate (IMDUR) 30 MG 24 hr tablet TAKE 1 TABLET (30 MG TOTAL) BY MOUTH DAILY.   loratadine (CLARITIN) 10 MG tablet Take by mouth.   multivitamin (RENA-VIT) TABS tablet TAKE 1 TABLET BY MOUTH ONCE DAILY   NON FORMULARY Heparin Sodium (Porcine) 1,000 Units/mL Systemic   traMADol (ULTRAM) 50 MG tablet TAKE 1 TABLET (50 MG TOTAL) BY MOUTH 2 (TWO) TIMES DAILY AS NEEDED.   Tuberculin PPD (TUBERSOL ID) Inject into the skin.   No facility-administered encounter medications on file as of 06/09/2021.       Observations/Objective: No direct observation done as this was a telephone encounter.  Assessment and Plan: 1. Obstructive sleep apnea I told patient that he can call [facility] to see whether they have CPAP machines in stock and if so, if he is agreeable to it, we can send his prescription there if they can get it for him much sooner than what [facility] is saying.  2. Chronic obstructive pulmonary disease, unspecified COPD type ([place]) Patient prefers to continue his Symbicort inhaler as needed rather than daily.  He is doing well without any recent flares.  3. Essential hypertension Continue current medications listed above.  4. Ischemic cardiomyopathy Stable and followed by cardiology.   Follow Up Instructions: 3-4 mths   I discussed the assessment and treatment plan with the patient. The patient was provided an opportunity to ask questions and all were answered. The patient agreed with the plan and demonstrated an understanding of the instructions.   The patient was advised to call back or seek an in-person evaluation if the symptoms worsen or if the condition fails to improve as anticipated.  I  Spent 13 minutes on this telephone encounter  Karle Plumber, MD

## 2021-06-11 ENCOUNTER — Telehealth: Payer: Self-pay | Admitting: Internal Medicine

## 2021-06-11 NOTE — Telephone Encounter (Signed)
-----   Message from [name], MD sent at 06/09/2021  5:33 PM EDT ----- F/u in 4 mths

## 2021-06-11 NOTE — Telephone Encounter (Signed)
Called patient and LVM to return call and schedule his follow up appointment. If patient calls back please schedule appointment.

## 2021-06-14 MED FILL — Carvedilol Tab 25 MG: ORAL | 30 days supply | Qty: 60 | Fill #1 | Status: AC

## 2021-06-15 ENCOUNTER — Other Ambulatory Visit: Payer: Self-pay

## 2021-06-15 ENCOUNTER — Ambulatory Visit (INDEPENDENT_AMBULATORY_CARE_PROVIDER_SITE_OTHER): Payer: Medicare Other

## 2021-06-15 DIAGNOSIS — I255 Ischemic cardiomyopathy: Secondary | ICD-10-CM | POA: Diagnosis not present

## 2021-06-16 ENCOUNTER — Other Ambulatory Visit: Payer: Self-pay

## 2021-06-16 MED ORDER — SILDENAFIL CITRATE 50 MG PO TABS
ORAL_TABLET | ORAL | 0 refills | Status: DC
  Filled 2021-06-16: qty 10, 30d supply, fill #0

## 2021-06-18 ENCOUNTER — Other Ambulatory Visit: Payer: Self-pay

## 2021-06-20 MED FILL — Ethyl Chloride Aerosol Spray: CUTANEOUS | 30 days supply | Qty: 116 | Fill #5 | Status: AC

## 2021-06-22 ENCOUNTER — Other Ambulatory Visit: Payer: Self-pay

## 2021-06-22 LAB — CUP PACEART REMOTE DEVICE CHECK
Battery Remaining Percentage: 92 %
Date Time Interrogation Session: 20220715155700
Implantable Lead Implant Date: 20150507
Implantable Lead Implant Date: 20150507
Implantable Lead Location: 753862
Implantable Lead Location: 753862
Implantable Lead Model: 3400
Implantable Lead Model: 3400
Implantable Pulse Generator Implant Date: 20211108
Pulse Gen Serial Number: 146303

## 2021-07-06 NOTE — Progress Notes (Signed)
Remote ICD transmission.   

## 2021-07-07 DIAGNOSIS — Z992 Dependence on renal dialysis: Secondary | ICD-10-CM | POA: Diagnosis not present

## 2021-07-07 DIAGNOSIS — R52 Pain, unspecified: Secondary | ICD-10-CM | POA: Diagnosis not present

## 2021-07-07 DIAGNOSIS — D631 Anemia in chronic kidney disease: Secondary | ICD-10-CM | POA: Diagnosis not present

## 2021-07-07 DIAGNOSIS — D509 Iron deficiency anemia, unspecified: Secondary | ICD-10-CM | POA: Diagnosis not present

## 2021-07-07 DIAGNOSIS — N2581 Secondary hyperparathyroidism of renal origin: Secondary | ICD-10-CM | POA: Diagnosis not present

## 2021-07-07 DIAGNOSIS — N186 End stage renal disease: Secondary | ICD-10-CM | POA: Diagnosis not present

## 2021-07-08 ENCOUNTER — Ambulatory Visit (HOSPITAL_COMMUNITY)
Admission: RE | Disposition: A | Discharge status: Home / Self Care | Payer: Self-pay | Source: Home / Self Care | Attending: Cardiology

## 2021-07-08 ENCOUNTER — Ambulatory Visit (HOSPITAL_COMMUNITY)
Admission: RE | Admit: 2021-07-08 | Discharge: 2021-07-08 | Disposition: A | Discharge status: Home / Self Care | Payer: Medicare Other | Attending: Cardiology | Admitting: Cardiology

## 2021-07-08 ENCOUNTER — Other Ambulatory Visit: Payer: Self-pay

## 2021-07-08 ENCOUNTER — Encounter (HOSPITAL_COMMUNITY): Payer: Self-pay | Admitting: Cardiology

## 2021-07-08 ENCOUNTER — Other Ambulatory Visit: Payer: Self-pay | Admitting: Internal Medicine

## 2021-07-08 DIAGNOSIS — G4730 Sleep apnea, unspecified: Secondary | ICD-10-CM | POA: Insufficient documentation

## 2021-07-08 DIAGNOSIS — I428 Other cardiomyopathies: Secondary | ICD-10-CM | POA: Diagnosis not present

## 2021-07-08 DIAGNOSIS — Z8674 Personal history of sudden cardiac arrest: Secondary | ICD-10-CM | POA: Insufficient documentation

## 2021-07-08 DIAGNOSIS — Z888 Allergy status to other drugs, medicaments and biological substances status: Secondary | ICD-10-CM | POA: Diagnosis not present

## 2021-07-08 DIAGNOSIS — I2510 Atherosclerotic heart disease of native coronary artery without angina pectoris: Secondary | ICD-10-CM

## 2021-07-08 DIAGNOSIS — Z880 Allergy status to penicillin: Secondary | ICD-10-CM | POA: Diagnosis not present

## 2021-07-08 DIAGNOSIS — I252 Old myocardial infarction: Secondary | ICD-10-CM | POA: Diagnosis not present

## 2021-07-08 DIAGNOSIS — Z8249 Family history of ischemic heart disease and other diseases of the circulatory system: Secondary | ICD-10-CM | POA: Insufficient documentation

## 2021-07-08 DIAGNOSIS — N186 End stage renal disease: Secondary | ICD-10-CM | POA: Insufficient documentation

## 2021-07-08 DIAGNOSIS — I5042 Chronic combined systolic (congestive) and diastolic (congestive) heart failure: Secondary | ICD-10-CM

## 2021-07-08 DIAGNOSIS — Z95810 Presence of automatic (implantable) cardiac defibrillator: Secondary | ICD-10-CM | POA: Insufficient documentation

## 2021-07-08 DIAGNOSIS — I132 Hypertensive heart and chronic kidney disease with heart failure and with stage 5 chronic kidney disease, or end stage renal disease: Secondary | ICD-10-CM | POA: Diagnosis not present

## 2021-07-08 DIAGNOSIS — E669 Obesity, unspecified: Secondary | ICD-10-CM | POA: Diagnosis not present

## 2021-07-08 DIAGNOSIS — I429 Cardiomyopathy, unspecified: Secondary | ICD-10-CM | POA: Diagnosis not present

## 2021-07-08 DIAGNOSIS — I509 Heart failure, unspecified: Secondary | ICD-10-CM | POA: Insufficient documentation

## 2021-07-08 DIAGNOSIS — Z6839 Body mass index (BMI) 39.0-39.9, adult: Secondary | ICD-10-CM | POA: Diagnosis not present

## 2021-07-08 DIAGNOSIS — Z887 Allergy status to serum and vaccine status: Secondary | ICD-10-CM | POA: Insufficient documentation

## 2021-07-08 DIAGNOSIS — Z7951 Long term (current) use of inhaled steroids: Secondary | ICD-10-CM | POA: Diagnosis not present

## 2021-07-08 HISTORY — PX: RIGHT/LEFT HEART CATH AND CORONARY ANGIOGRAPHY: CATH118266

## 2021-07-08 LAB — POCT I-STAT 7, (LYTES, BLD GAS, ICA,H+H)
Acid-Base Excess: 8 mmol/L — ABNORMAL HIGH (ref 0.0–2.0)
Acid-Base Excess: 9 mmol/L — ABNORMAL HIGH (ref 0.0–2.0)
Bicarbonate: 34.9 mmol/L — ABNORMAL HIGH (ref 20.0–28.0)
Bicarbonate: 35.1 mmol/L — ABNORMAL HIGH (ref 20.0–28.0)
Calcium, Ion: 1.11 mmol/L — ABNORMAL LOW (ref 1.15–1.40)
Calcium, Ion: 1.12 mmol/L — ABNORMAL LOW (ref 1.15–1.40)
HCT: 35 % — ABNORMAL LOW (ref 39.0–52.0)
HCT: 35 % — ABNORMAL LOW (ref 39.0–52.0)
Hemoglobin: 11.9 g/dL — ABNORMAL LOW (ref 13.0–17.0)
Hemoglobin: 11.9 g/dL — ABNORMAL LOW (ref 13.0–17.0)
O2 Saturation: 74 %
O2 Saturation: 75 %
Potassium: 4 mmol/L (ref 3.5–5.1)
Potassium: 4 mmol/L (ref 3.5–5.1)
Sodium: 140 mmol/L (ref 135–145)
Sodium: 141 mmol/L (ref 135–145)
TCO2: 37 mmol/L — ABNORMAL HIGH (ref 22–32)
TCO2: 37 mmol/L — ABNORMAL HIGH (ref 22–32)
pCO2 arterial: 55.8 mmHg — ABNORMAL HIGH (ref 32.0–48.0)
pCO2 arterial: 56.9 mmHg — ABNORMAL HIGH (ref 32.0–48.0)
pH, Arterial: 7.398 (ref 7.350–7.450)
pH, Arterial: 7.404 (ref 7.350–7.450)
pO2, Arterial: 41 mmHg — ABNORMAL LOW (ref 83.0–108.0)
pO2, Arterial: 41 mmHg — ABNORMAL LOW (ref 83.0–108.0)

## 2021-07-08 LAB — CBC
HCT: 37.3 % — ABNORMAL LOW (ref 39.0–52.0)
Hemoglobin: 12.5 g/dL — ABNORMAL LOW (ref 13.0–17.0)
MCH: 33.3 pg (ref 26.0–34.0)
MCHC: 33.5 g/dL (ref 30.0–36.0)
MCV: 99.5 fL (ref 80.0–100.0)
Platelets: 170 10*3/uL (ref 150–400)
RBC: 3.75 MIL/uL — ABNORMAL LOW (ref 4.22–5.81)
RDW: 15.1 % (ref 11.5–15.5)
WBC: 4.7 10*3/uL (ref 4.0–10.5)
nRBC: 0 % (ref 0.0–0.2)

## 2021-07-08 LAB — BASIC METABOLIC PANEL
Anion gap: 15 (ref 5–15)
BUN: 35 mg/dL — ABNORMAL HIGH (ref 6–20)
CO2: 26 mmol/L (ref 22–32)
Calcium: 8.6 mg/dL — ABNORMAL LOW (ref 8.9–10.3)
Chloride: 98 mmol/L (ref 98–111)
Creatinine, Ser: 11.26 mg/dL — ABNORMAL HIGH (ref 0.61–1.24)
GFR, Estimated: 5 mL/min — ABNORMAL LOW (ref >60)
Glucose, Bld: 91 mg/dL (ref 70–99)
Potassium: 4 mmol/L (ref 3.5–5.1)
Sodium: 139 mmol/L (ref 135–145)

## 2021-07-08 SURGERY — RIGHT/LEFT HEART CATH AND CORONARY ANGIOGRAPHY
Anesthesia: LOCAL

## 2021-07-08 MED ORDER — SODIUM CHLORIDE 0.9% FLUSH: 3.0000 mL | Freq: Two times a day (BID) | INTRAVENOUS | Status: DC

## 2021-07-08 MED ORDER — FENTANYL CITRATE (PF) 100 MCG/2ML IJ SOLN
INTRAMUSCULAR | Status: AC
  Filled 2021-07-08: qty 2

## 2021-07-08 MED ORDER — MIDAZOLAM HCL 2 MG/2ML IJ SOLN
INTRAMUSCULAR | Status: DC | PRN
  Administered 2021-07-08 (×2): 1 mg via INTRAVENOUS

## 2021-07-08 MED ORDER — MIDAZOLAM HCL 2 MG/2ML IJ SOLN
INTRAMUSCULAR | Status: AC
  Filled 2021-07-08: qty 2

## 2021-07-08 MED ORDER — VERAPAMIL HCL 2.5 MG/ML IV SOLN
INTRAVENOUS | Status: AC
  Filled 2021-07-08: qty 2

## 2021-07-08 MED ORDER — ASPIRIN 81 MG PO CHEW
81.0000 mg | CHEWABLE_TABLET | Freq: Once | ORAL | Status: AC
  Administered 2021-07-08: 81 mg via ORAL
  Filled 2021-07-08: qty 1

## 2021-07-08 MED ORDER — ACETAMINOPHEN 325 MG PO TABS: 650.0000 mg | ORAL_TABLET | ORAL | Status: DC | PRN

## 2021-07-08 MED ORDER — FENTANYL CITRATE (PF) 100 MCG/2ML IJ SOLN
INTRAMUSCULAR | Status: DC | PRN
  Administered 2021-07-08 (×2): 25 ug via INTRAVENOUS

## 2021-07-08 MED ORDER — SODIUM CHLORIDE 0.9 % IV SOLN: INTRAVENOUS | Status: DC

## 2021-07-08 MED ORDER — LABETALOL HCL 5 MG/ML IV SOLN: 10.0000 mg | INTRAVENOUS | Status: DC | PRN

## 2021-07-08 MED ORDER — SODIUM CHLORIDE 0.9% FLUSH: 3.0000 mL | INTRAVENOUS | Status: DC | PRN

## 2021-07-08 MED ORDER — HEPARIN (PORCINE) IN NACL 1000-0.9 UT/500ML-% IV SOLN
INTRAVENOUS | Status: AC
  Filled 2021-07-08: qty 1000

## 2021-07-08 MED ORDER — HEPARIN SODIUM (PORCINE) 1000 UNIT/ML IJ SOLN
INTRAMUSCULAR | Status: AC
  Filled 2021-07-08: qty 1

## 2021-07-08 MED ORDER — IOHEXOL 350 MG/ML SOLN
INTRAVENOUS | Status: DC | PRN
  Administered 2021-07-08: 50 mL

## 2021-07-08 MED ORDER — TETRACAINE HCL 1 % IJ SOLN
20.0000 mg | Freq: Once | INTRAMUSCULAR | Status: DC
  Filled 2021-07-08: qty 6

## 2021-07-08 MED ORDER — LIDOCAINE HCL (PF) 1 % IJ SOLN
INTRAMUSCULAR | Status: AC
  Filled 2021-07-08: qty 30

## 2021-07-08 MED ORDER — SODIUM CHLORIDE 0.9 % IV SOLN: 250.0000 mL | INTRAVENOUS | Status: DC | PRN

## 2021-07-08 MED ORDER — ISOSORBIDE MONONITRATE ER 30 MG PO TB24
ORAL_TABLET | Freq: Three times a day (TID) | ORAL | 0 refills | Status: DC | PRN
Start: 2021-07-08 — End: 2021-07-20
  Filled 2021-07-08: qty 90, 90d supply, fill #0

## 2021-07-08 MED ORDER — HYDRALAZINE HCL 20 MG/ML IJ SOLN: 10.0000 mg | INTRAMUSCULAR | Status: DC | PRN

## 2021-07-08 MED ORDER — HEPARIN (PORCINE) IN NACL 1000-0.9 UT/500ML-% IV SOLN
INTRAVENOUS | Status: DC | PRN
  Administered 2021-07-08 (×3): 500 mL

## 2021-07-08 MED ORDER — ONDANSETRON HCL 4 MG/2ML IJ SOLN: 4.0000 mg | Freq: Four times a day (QID) | INTRAMUSCULAR | Status: DC | PRN

## 2021-07-08 MED ORDER — TETRACAINE HCL 1 % IJ SOLN
INTRAMUSCULAR | Status: DC | PRN
  Administered 2021-07-08: 60 mg

## 2021-07-08 SURGICAL SUPPLY — 10 items
CATH INFINITI 5FR MULTPACK ANG (CATHETERS) ×1 IMPLANT
CATH SWAN GANZ 7F STRAIGHT (CATHETERS) ×1 IMPLANT
KIT HEART LEFT (KITS) ×2 IMPLANT
PACK CARDIAC CATHETERIZATION (CUSTOM PROCEDURE TRAY) ×2 IMPLANT
SHEATH PINNACLE 5F 10CM (SHEATH) ×1 IMPLANT
SHEATH PINNACLE 7F 10CM (SHEATH) ×1 IMPLANT
SHEATH PROBE COVER 6X72 (BAG) ×1 IMPLANT
TRANSDUCER W/STOPCOCK (MISCELLANEOUS) ×2 IMPLANT
WIRE EMERALD 3MM-J .035X150CM (WIRE) ×1 IMPLANT
WIRE HI TORQ VERSACORE J 260CM (WIRE) IMPLANT

## 2021-07-08 NOTE — Telephone Encounter (Signed)
Requested Prescriptions  Pending Prescriptions Disp Refills  . isosorbide mononitrate (IMDUR) 30 MG 24 hr tablet 90 tablet 0    Sig: TAKE 1 TABLET (30 MG TOTAL) BY MOUTH DAILY.     Cardiovascular:  Nitrates Passed - 07/08/2021  2:48 PM      Passed - Last BP in normal range    BP Readings from Last 1 Encounters:  07/08/21 112/71         Passed - Last Heart Rate in normal range    Pulse Readings from Last 1 Encounters:  07/08/21 76         Passed - Valid encounter within last 12 months    Recent Outpatient Visits          4 weeks ago Obstructive sleep apnea   [place], MD   5 months ago Essential hypertension   [place], MD   6 months ago COVID-19 virus infection   [place] [place], [place], [place]   9 months ago Hospital discharge follow-up   [place], Deborah B, MD   1 year ago Hospital discharge follow-up   [place], MD      Future Appointments            In 2 months [name], MD [place]

## 2021-07-08 NOTE — Progress Notes (Signed)
Discharge instructions reviewed with pt and his sig other. Both voice understanding.

## 2021-07-08 NOTE — H&P (Signed)
Advanced Heart Failure Team History and Physical Note   PCP:  [name], MD  PCP-Cardiology: None      HPI:    Patient with ESRD and cardiomyopathy presents for cath to see if any targets for intervention/potential LV function improvement.  Turned down for renal transplant due to low EF.   Review of Systems: [y] = yes, [ ]  = no   General: Weight gain [ ] ; Weight loss [ ] ; Anorexia [ ] ; Fatigue [ ] ; Fever [ ] ; Chills [ ] ; Weakness [ ]   Cardiac: Chest pain/pressure [ ] ; Resting SOB [ ] ; Exertional SOB [ ] ; Orthopnea [ ] ; Pedal Edema [ ] ; Palpitations [ ] ; Syncope [ ] ; Presyncope [ ] ; Paroxysmal nocturnal dyspnea[ ]   Pulmonary: Cough [ ] ; Wheezing[ ] ; Hemoptysis[ ] ; Sputum [ ] ; Snoring [ ]   GI: Vomiting[ ] ; Dysphagia[ ] ; Melena[ ] ; Hematochezia [ ] ; Heartburn[ ] ; Abdominal pain [ ] ; Constipation [ ] ; Diarrhea [ ] ; BRBPR [ ]   GU: Hematuria[ ] ; Dysuria [ ] ; Nocturia[ ]   Vascular: Pain in legs with walking [ ] ; Pain in feet with lying flat [ ] ; Non-healing sores [ ] ; Stroke [ ] ; TIA [ ] ; Slurred speech [ ] ;  Neuro: Headaches[ ] ; Vertigo[ ] ; Seizures[ ] ; Paresthesias[ ] ;Blurred vision [ ] ; Diplopia [ ] ; Vision changes [ ]   Ortho/Skin: Arthritis [ ] ; Joint pain [ ] ; Muscle pain [ ] ; Joint swelling [ ] ; Back Pain [ ] ; Rash [ ]   Psych: Depression[ ] ; Anxiety[ ]   Heme: Bleeding problems [ ] ; Clotting disorders [ ] ; Anemia [ ]   Endocrine: Diabetes [ ] ; Thyroid dysfunction[ ]    Home Medications Prior to Admission medications   Medication Sig Start Date End Date Taking? Authorizing Provider  acetaminophen (TYLENOL) 500 MG tablet Take 1,000 mg by mouth every 8 (eight) hours as needed for mild pain, moderate pain or headache. 01/20/21 01/19/22 Yes [provider]  albuterol (PROVENTIL) (2.5 MG/3ML) 0.083% nebulizer solution TAKE 3 MLS (2.5 MG TOTAL) BY NEBULIZATION EVERY 6 (SIX) HOURS AS NEEDED FOR WHEEZING OR SHORTNESS OF BREATH. 12/08/20 12/08/21 Yes [name], MD  albuterol  (VENTOLIN HFA) 108 (90 Base) MCG/ACT inhaler Inhale 2 puffs into the lungs every 4 (four) hours as needed for wheezing or shortness of breath. 10/01/19  Yes [name], MD  aspirin EC 81 MG tablet Take 1 tablet (81 mg total) by mouth daily. 01/22/16  Yes [name], Josalyn, MD  atorvastatin (LIPITOR) 40 MG tablet TAKE 1 TABLET (40 MG TOTAL) BY MOUTH AT BEDTIME. Patient taking differently: Take 40 mg by mouth at bedtime. 02/11/21 02/11/22 Yes [name], MD  AURYXIA 1 GM 210 MG(Fe) tablet Take 210 mg by mouth 3 (three) times daily. 04/14/21  Yes [provider]  budesonide-formoterol (SYMBICORT) 80-4.5 MCG/ACT inhaler Inhale 2 puffs into the lungs 2 (two) times daily. Patient taking differently: Inhale 2 puffs into the lungs daily as needed (wheezing). 06/04/20  Yes [name], MD  calcium acetate (PHOSLO) 667 MG capsule Take 2 capsule by mouth three times a day with meals Patient taking differently: Take 1,334 mg by mouth 3 (three) times daily with meals. 06/09/21  Yes   carvedilol (COREG) 25 MG tablet TAKE 1 TABLET (25 MG TOTAL) BY MOUTH 2 (TWO) TIMES DAILY WITH A MEAL. Patient taking differently: Take 25 mg by mouth 2 (two) times daily with a meal. 11/24/20 11/24/21 Yes [name], MD  Cholecalciferol (VITAMIN D-3) 125 MCG (5000 UT) TABS  Take 5,000 Units by mouth daily.   Yes [provider]  EPINEPHrine 0.3 mg/0.3 mL IJ SOAJ injection Inject 0.3 mg into the muscle once as needed for up to 1 dose (if worsening tongue swelling, SOB, hypoxia, or other concerns for progressive anaphylaxis). 09/23/20  Yes Para Skeans, MD  ethyl chloride spray SPRAY 1 SPRAY TO SKIN THREE TIMES A WEEK AS DIRECTED SPRAY A SMALL AMOUNT JUST PRIOR TO NEEDLE INSERTION Patient taking differently: Apply 1 application topically every Tuesday, Thursday, and Saturday at 6 PM. 01/20/21 01/20/22 Yes Stovall, Woodfin Ganja, PA-C  fluticasone (FLONASE) 50 MCG/ACT nasal spray Place 1 spray into both  nostrils daily. Patient taking differently: Place 1 spray into both nostrils daily as needed for allergies or rhinitis. 12/31/18  Yes Providence Lanius A, PA-C  hydrALAZINE (APRESOLINE) 25 MG tablet Take 1.5 tablets (37.5 mg total) by mouth 2 (two) times daily. Patient taking differently: Take 37.5 mg by mouth 3 (three) times daily. 05/11/21  Yes Larey Dresser, MD  iron sucrose in sodium chloride 0.9 % 100 mL Iron Sucrose (Venofer) 02/19/21 02/11/22 Yes [provider]  isosorbide mononitrate (IMDUR) 30 MG 24 hr tablet TAKE 1 TABLET (30 MG TOTAL) BY MOUTH DAILY. Patient taking differently: Take 30 mg by mouth daily. 03/04/21 03/04/22 Yes [name], MD  loratadine (CLARITIN) 10 MG tablet Take 10 mg by mouth daily as needed for allergies or rhinitis. 01/19/21  Yes [provider]  multivitamin (RENA-VIT) TABS tablet TAKE 1 TABLET BY MOUTH ONCE DAILY 05/11/21 05/11/22 Yes Larey Dresser, MD  sildenafil (VIAGRA) 50 MG tablet Take 1 tablet by mouth once a day as needed 30 min prior to sexual activity. Patient taking differently: Take 50 mg by mouth as needed for erectile dysfunction. 06/16/21  Yes   traMADol (ULTRAM) 50 MG tablet TAKE 1 TABLET (50 MG TOTAL) BY MOUTH 2 (TWO) TIMES DAILY AS NEEDED. Patient taking differently: Take 50 mg by mouth 2 (two) times daily as needed for severe pain or moderate pain. 02/06/21 08/07/21 Yes [name], MD  calcitRIOL (ROCALTROL) 0.5 MCG capsule TAKE 1 CAPSULE BY MOUTH ONCE DAILY Patient not taking: No sig reported 10/03/20 10/03/21  Rexene Agent, MD  cetirizine (ZYRTEC) 10 MG tablet TAKE 1 TABLET (10 MG TOTAL) BY MOUTH AT BEDTIME. Patient not taking: Reported on 07/01/2021 09/23/20 09/23/21  Para Skeans, MD  doxercalciferol (HECTOROL) 0.5 MCG capsule Doxercalciferol (Hectorol) 01/20/21 01/19/22  [provider]    Past Medical History: Past Medical History:  Diagnosis Date   AICD (automatic cardioverter/defibrillator) present     2015   Asthma    no meds   Cardiac arrest ([place]) 2015   CHF (congestive heart failure) (HCC)    Chronic kidney disease    ckd -stage 5   Coronary artery disease    Hypertension    Myocardial infarction ([place])    Obesity    Pneumonia    Pulmonary edema    Shortness of breath dyspnea    Sleep apnea    USES CPAP   Wears glasses     Past Surgical History: Past Surgical History:  Procedure Laterality Date   AV FISTULA PLACEMENT Right 03/15/2019   Procedure: RIGHT ARM ARTERIOVENOUS (AV) FISTULA CREATION;  Surgeon: Angelia Mould, MD;  Location: [place] [place] [facility];  Service: Vascular;  Laterality: Right;   COLONOSCOPY     COLONOSCOPY W/ BIOPSIES AND POLYPECTOMY     CORONARY STENT PLACEMENT  2007   IMPLANTABLE  CARDIOVERTER DEFIBRILLATOR IMPLANT  2015   SUBQ ICD CHANGEOUT N/A 10/13/2020   Procedure: SUBQ ICD CHANGEOUT;  Surgeon: Evans Lance, MD;  Location: [facility];  Service: Cardiovascular;  Laterality: N/A;    Family History:  Family History  Problem Relation Age of Onset   Hypertension Mother    Liver disease Father     Social History: Social History   Socioeconomic History   Marital status: Single    Spouse name: Not on file   Number of children: Not on file   Years of education: Not on file   Highest education level: Not on file  Occupational History   Not on file  Tobacco Use   Smoking status: Never   Smokeless tobacco: Never  Vaping Use   Vaping Use: Never used  Substance and Sexual Activity   Alcohol use: No   Drug use: No   Sexual activity: Yes  Other Topics Concern   Not on file  Social History Narrative   ** Merged History Encounter **       Social Determinants of Health   Financial Resource Strain: Not on file  Food Insecurity: Not on file  Transportation Needs: Not on file  Physical Activity: Not on file  Stress: Not on file  Social Connections: Not on file    Allergies:  Allergies  Allergen Reactions   Ace Inhibitors Swelling     Swelling of the tongue   Influenza Vaccines Hives and Swelling    SWELLING REACTION UNSPECIFIED    Ketorolac Swelling    SWELLING REACTION UNSPECIFIED    Lidocaine Swelling    TONGUE SWELLS   Lisinopril Swelling    TONGUE SWELLING Pt reported problem with a BP med which sounded like  lisinopril  But as of 09/19/06,pt had tolerated altace without problem   Penicillins Swelling    TONGUE SWELLS Has patient had a PCN reaction causing immediate rash, facial/tongue/throat swelling, SOB or lightheadedness with hypotension: Yes Has patient had a PCN reaction causing severe rash involving mucus membranes or skin necrosis: No Has patient had a PCN reaction that required hospitalization No Has patient had a PCN reaction occurring within the last 10 years: Yes If all of the above answers are "NO", then may proceed with Cephalosporin use.     Objective:    Vital Signs:   Temp:  [98.1 F (36.7 C)] 98.1 F (36.7 C) (08/03 0818) Pulse Rate:  [81] 81 (08/03 0818) BP: (131)/(83) 131/83 (08/03 0818) SpO2:  [100 %] 100 % (08/03 0818) Weight:  [113.9 kg] 113.9 kg (08/03 0818)   Filed Weights   07/08/21 0818  Weight: 113.9 kg     Physical Exam     General:  Well appearing. No respiratory difficulty HEENT: Normal Neck: Supple. no JVD. Carotids 2+ bilat; no bruits. No lymphadenopathy or thyromegaly appreciated. Cor: PMI nondisplaced. Regular rate & rhythm. No rubs, gallops or murmurs. Lungs: Clear Abdomen: Soft, nontender, nondistended. No hepatosplenomegaly. No bruits or masses. Good bowel sounds. Extremities: No cyanosis, clubbing, rash, edema Neuro: Alert & oriented x 3, cranial nerves grossly intact. moves all 4 extremities w/o difficulty. Affect pleasant.  Labs     Basic Metabolic Panel: No results for input(s): NA, K, CL, CO2, GLUCOSE, BUN, CREATININE, CALCIUM, MG, PHOS in the last 168 hours.  Liver Function Tests: No results for input(s): AST, ALT, ALKPHOS, BILITOT, PROT,  ALBUMIN in the last 168 hours. No results for input(s): LIPASE, AMYLASE in the last 168 hours. No  results for input(s): AMMONIA in the last 168 hours.  CBC: No results for input(s): WBC, NEUTROABS, HGB, HCT, MCV, PLT in the last 168 hours.  Cardiac Enzymes: No results for input(s): CKTOTAL, CKMB, CKMBINDEX, TROPONINI in the last 168 hours.  BNP: BNP (last 3 results) Recent Labs    12/09/20 1055 01/11/21 2105  BNP 825.1* 870.8*    ProBNP (last 3 results) No results for input(s): PROBNP in the last 8760 hours.   CBG: No results for input(s): GLUCAP in the last 168 hours.  Coagulation Studies: No results for input(s): LABPROT, INR in the last 72 hours.  Imaging: No results found.    Assessment/Plan   LHC/RHC today with fall in EF, assess for coronary disease as cause of worsening cardiomyopathy.    Loralie Champagne, MD 07/08/2021, 8:32 AM  Advanced Heart Failure Team Pager [phone] (M-F; 7a - 5p)  Please contact [facility] for night-coverage after hours (4p -7a ) and weekends on [email or web address]

## 2021-07-08 NOTE — Progress Notes (Signed)
Ambulated no bleeding noted before or after ambulation.

## 2021-07-08 NOTE — Progress Notes (Signed)
Site area: right groin a 5 french arterial sheath was removed  Site Prior to Removal:  Level 0  Pressure Applied For 20 MINUTES    Bedrest Beginning at 1220p  x 4 hours  Manual:   Yes.    Patient Status During Pull:  stable  Post Pull Groin Site:  Level 0  Post Pull Instructions Given:  Yes.    Post Pull Pulses Present:  Yes.    Dressing Applied:  Yes.    Comments:

## 2021-07-09 ENCOUNTER — Other Ambulatory Visit: Payer: Self-pay | Admitting: Internal Medicine

## 2021-07-09 ENCOUNTER — Other Ambulatory Visit: Payer: Self-pay

## 2021-07-09 DIAGNOSIS — D631 Anemia in chronic kidney disease: Secondary | ICD-10-CM | POA: Diagnosis not present

## 2021-07-09 DIAGNOSIS — Z992 Dependence on renal dialysis: Secondary | ICD-10-CM | POA: Diagnosis not present

## 2021-07-09 DIAGNOSIS — D509 Iron deficiency anemia, unspecified: Secondary | ICD-10-CM | POA: Diagnosis not present

## 2021-07-09 DIAGNOSIS — I2510 Atherosclerotic heart disease of native coronary artery without angina pectoris: Secondary | ICD-10-CM

## 2021-07-09 DIAGNOSIS — R52 Pain, unspecified: Secondary | ICD-10-CM | POA: Diagnosis not present

## 2021-07-09 DIAGNOSIS — N2581 Secondary hyperparathyroidism of renal origin: Secondary | ICD-10-CM | POA: Diagnosis not present

## 2021-07-09 DIAGNOSIS — N186 End stage renal disease: Secondary | ICD-10-CM | POA: Diagnosis not present

## 2021-07-09 DIAGNOSIS — E785 Hyperlipidemia, unspecified: Secondary | ICD-10-CM

## 2021-07-09 MED ORDER — ATORVASTATIN CALCIUM 40 MG PO TABS
ORAL_TABLET | Freq: Every day | ORAL | 2 refills | Status: DC
  Filled 2021-07-09: qty 30, 30d supply, fill #0
  Filled 2021-12-01: qty 30, 30d supply, fill #1

## 2021-07-09 MED FILL — Lidocaine HCl Local Preservative Free (PF) Inj 1%: INTRAMUSCULAR | Qty: 30 | Status: AC

## 2021-07-11 DIAGNOSIS — R52 Pain, unspecified: Secondary | ICD-10-CM | POA: Diagnosis not present

## 2021-07-11 DIAGNOSIS — D509 Iron deficiency anemia, unspecified: Secondary | ICD-10-CM | POA: Diagnosis not present

## 2021-07-11 DIAGNOSIS — N186 End stage renal disease: Secondary | ICD-10-CM | POA: Diagnosis not present

## 2021-07-11 DIAGNOSIS — D631 Anemia in chronic kidney disease: Secondary | ICD-10-CM | POA: Diagnosis not present

## 2021-07-11 DIAGNOSIS — N2581 Secondary hyperparathyroidism of renal origin: Secondary | ICD-10-CM | POA: Diagnosis not present

## 2021-07-11 DIAGNOSIS — Z992 Dependence on renal dialysis: Secondary | ICD-10-CM | POA: Diagnosis not present

## 2021-07-13 ENCOUNTER — Encounter (HOSPITAL_COMMUNITY): Payer: Self-pay

## 2021-07-13 ENCOUNTER — Emergency Department (HOSPITAL_COMMUNITY)
Admission: EM | Admit: 2021-07-13 | Discharge: 2021-07-14 | Disposition: A | Discharge status: Home / Self Care | Payer: Medicare Other | Attending: Emergency Medicine | Admitting: Emergency Medicine

## 2021-07-13 DIAGNOSIS — Z992 Dependence on renal dialysis: Secondary | ICD-10-CM | POA: Insufficient documentation

## 2021-07-13 DIAGNOSIS — T783XXA Angioneurotic edema, initial encounter: Secondary | ICD-10-CM | POA: Insufficient documentation

## 2021-07-13 DIAGNOSIS — R000 Tachycardia, unspecified: Secondary | ICD-10-CM | POA: Diagnosis not present

## 2021-07-13 DIAGNOSIS — I2510 Atherosclerotic heart disease of native coronary artery without angina pectoris: Secondary | ICD-10-CM | POA: Diagnosis not present

## 2021-07-13 DIAGNOSIS — N186 End stage renal disease: Secondary | ICD-10-CM

## 2021-07-13 DIAGNOSIS — J45909 Unspecified asthma, uncomplicated: Secondary | ICD-10-CM | POA: Insufficient documentation

## 2021-07-13 DIAGNOSIS — I132 Hypertensive heart and chronic kidney disease with heart failure and with stage 5 chronic kidney disease, or end stage renal disease: Secondary | ICD-10-CM | POA: Diagnosis not present

## 2021-07-13 DIAGNOSIS — I5042 Chronic combined systolic (congestive) and diastolic (congestive) heart failure: Secondary | ICD-10-CM | POA: Diagnosis not present

## 2021-07-13 DIAGNOSIS — Z79899 Other long term (current) drug therapy: Secondary | ICD-10-CM | POA: Diagnosis not present

## 2021-07-13 DIAGNOSIS — Z7982 Long term (current) use of aspirin: Secondary | ICD-10-CM | POA: Insufficient documentation

## 2021-07-13 DIAGNOSIS — I120 Hypertensive chronic kidney disease with stage 5 chronic kidney disease or end stage renal disease: Secondary | ICD-10-CM | POA: Diagnosis not present

## 2021-07-13 DIAGNOSIS — T7840XA Allergy, unspecified, initial encounter: Secondary | ICD-10-CM | POA: Diagnosis present

## 2021-07-13 MED ORDER — DIPHENHYDRAMINE HCL 50 MG/ML IJ SOLN
25.0000 mg | Freq: Once | INTRAMUSCULAR | Status: AC
  Administered 2021-07-13: 25 mg via INTRAVENOUS
  Filled 2021-07-13: qty 1

## 2021-07-13 MED ORDER — EPINEPHRINE 0.3 MG/0.3ML IJ SOAJ
0.3000 mg | Freq: Once | INTRAMUSCULAR | Status: AC
  Administered 2021-07-13: 0.3 mg via INTRAMUSCULAR
  Filled 2021-07-13: qty 0.3

## 2021-07-13 MED ORDER — METHYLPREDNISOLONE SODIUM SUCC 125 MG IJ SOLR
125.0000 mg | Freq: Once | INTRAMUSCULAR | Status: AC
  Administered 2021-07-13: 125 mg via INTRAVENOUS
  Filled 2021-07-13: qty 2

## 2021-07-13 MED ORDER — FAMOTIDINE IN NACL 20-0.9 MG/50ML-% IV SOLN
20.0000 mg | Freq: Once | INTRAVENOUS | Status: AC
  Administered 2021-07-13: 20 mg via INTRAVENOUS
  Filled 2021-07-13: qty 50

## 2021-07-13 NOTE — ED Triage Notes (Signed)
Pt states that he ate some fish sticks from [employer] and then became to have severe angioedema and some SOB.

## 2021-07-13 NOTE — ED Provider Notes (Signed)
[facility] EMERGENCY DEPARTMENT Provider Note   CSN: [id number] Arrival date & time: 07/13/21  2326     History Chief Complaint  Patient presents with   Allergic Reaction    Victor Thompson is a 60 y.o. male.  The history is provided by the patient.  Allergic Reaction He has history of hypertension, coronary artery disease, combined systolic and diastolic heart failure, end-stage renal disease on hemodialysis and comes in because of tongue swelling which started at 10:55 PM.  He denies any itch itching and denies any difficulty breathing.  There is minimal difficulty swallowing mucosa tongue occludes his mouth.  He is having some difficulty talking because of his tongue swelling.  He has had similar episodes in the past and evaluation has not found an obvious cause.  He is not on any ACE inhibitors or angiotensin receptor blockers.   Past Medical History:  Diagnosis Date   AICD (automatic cardioverter/defibrillator) present    2015   Asthma    no meds   Cardiac arrest ([place]) 2015   CHF (congestive heart failure) (HCC)    Chronic kidney disease    ckd -stage 5   Coronary artery disease    Hypertension    Myocardial infarction ([place])    Obesity    Pneumonia    Pulmonary edema    Shortness of breath dyspnea    Sleep apnea    USES CPAP   Wears glasses     Patient Active Problem List   Diagnosis Date Noted   ESRD on dialysis ([place]) 02/06/2021   Hypervolemia associated with renal insufficiency 01/12/2021   Angioedema 09/22/2020   Secondary hyperparathyroidism of renal origin ([place]) 07/15/2019   Vitamin D deficiency 07/15/2019   CAD (coronary artery disease), native coronary artery 04/10/2019   Chronic midline low back pain without sciatica 02/17/2018   VT (ventricular tachycardia) ([place]) 03/29/2016   Defibrillator discharge    Chronic combined systolic and diastolic CHF (congestive heart failure) ([place]) 01/22/2016   Erectile dysfunction 01/22/2016    Allergic rhinitis 01/22/2016   GERD (gastroesophageal reflux disease) 01/22/2016   PROTEINURIA 02/17/2010   Cardiomyopathy, ischemic 02/16/2010   Obstructive sleep apnea 02/16/2010   EXTERNAL HEMORRHOIDS 09/26/2009   Tobacco use disorder in remission 06/03/2009   Essential hypertension 10/01/2008   Hyperlipidemia 07/14/2007   Class 3 severe obesity due to excess calories with serious comorbidity and body mass index (BMI) of 40.0 to 44.9 in adult [facility]) 07/14/2007   Asthma 07/14/2007    Past Surgical History:  Procedure Laterality Date   AV FISTULA PLACEMENT Right 03/15/2019   Procedure: RIGHT ARM ARTERIOVENOUS (AV) FISTULA CREATION;  Surgeon: Angelia Mould, MD;  Location: [facility];  Service: Vascular;  Laterality: Right;   COLONOSCOPY     COLONOSCOPY W/ BIOPSIES AND POLYPECTOMY     CORONARY STENT PLACEMENT  2007   IMPLANTABLE CARDIOVERTER DEFIBRILLATOR IMPLANT  2015   RIGHT/LEFT HEART CATH AND CORONARY ANGIOGRAPHY N/A 07/08/2021   Procedure: RIGHT/LEFT HEART CATH AND CORONARY ANGIOGRAPHY;  Surgeon: Larey Dresser, MD;  Location: [place] [facility];  Service: Cardiovascular;  Laterality: N/A;   SUBQ ICD CHANGEOUT N/A 10/13/2020   Procedure: SUBQ ICD CHANGEOUT;  Surgeon: Evans Lance, MD;  Location: [place] [facility];  Service: Cardiovascular;  Laterality: N/A;       Family History  Problem Relation Age of Onset   Hypertension Mother    Liver disease Father     Social History   Tobacco Use  Smoking status: Never   Smokeless tobacco: Never  Vaping Use   Vaping Use: Never used  Substance Use Topics   Alcohol use: No   Drug use: No    Home Medications Prior to Admission medications   Medication Sig Start Date End Date Taking? Authorizing Provider  acetaminophen (TYLENOL) 500 MG tablet Take 1,000 mg by mouth every 8 (eight) hours as needed for mild pain, moderate pain or headache. 01/20/21 01/19/22  [provider]  albuterol (PROVENTIL) (2.5 MG/3ML) 0.083%  nebulizer solution TAKE 3 MLS (2.5 MG TOTAL) BY NEBULIZATION EVERY 6 (SIX) HOURS AS NEEDED FOR WHEEZING OR SHORTNESS OF BREATH. 12/08/20 12/08/21  [name], MD  albuterol (VENTOLIN HFA) 108 (90 Base) MCG/ACT inhaler Inhale 2 puffs into the lungs every 4 (four) hours as needed for wheezing or shortness of breath. 10/01/19   [name], MD  aspirin EC 81 MG tablet Take 1 tablet (81 mg total) by mouth daily. 01/22/16   [name], [name], MD  atorvastatin (LIPITOR) 40 MG tablet TAKE 1 TABLET (40 MG TOTAL) BY MOUTH AT BEDTIME. 07/09/21 07/09/22  [name], MD  AURYXIA 1 GM 210 MG(Fe) tablet Take 210 mg by mouth 3 (three) times daily. 04/14/21   [provider]  budesonide-formoterol (SYMBICORT) 80-4.5 MCG/ACT inhaler Inhale 2 puffs into the lungs 2 (two) times daily. Patient taking differently: Inhale 2 puffs into the lungs daily as needed (wheezing). 06/04/20   [name], MD  calcitRIOL (ROCALTROL) 0.5 MCG capsule TAKE 1 CAPSULE BY MOUTH ONCE DAILY 10/03/20 10/03/21  Rexene Agent, MD  calcium acetate (PHOSLO) 667 MG capsule Take 2 capsule by mouth three times a day with meals Patient taking differently: Take 1,334 mg by mouth 3 (three) times daily with meals. 06/09/21     carvedilol (COREG) 25 MG tablet TAKE 1 TABLET (25 MG TOTAL) BY MOUTH 2 (TWO) TIMES DAILY WITH A MEAL. Patient taking differently: Take 25 mg by mouth 2 (two) times daily with a meal. 11/24/20 11/24/21  [name], MD  cetirizine (ZYRTEC) 10 MG tablet TAKE 1 TABLET (10 MG TOTAL) BY MOUTH AT BEDTIME. Patient not taking: Reported on 07/01/2021 09/23/20 09/23/21  Para Skeans, MD  Cholecalciferol (VITAMIN D-3) 125 MCG (5000 UT) TABS Take 5,000 Units by mouth daily.    [provider]  doxercalciferol (HECTOROL) 0.5 MCG capsule Doxercalciferol (Hectorol) 01/20/21 01/19/22  [provider]  EPINEPHrine 0.3 mg/0.3 mL IJ SOAJ injection Inject 0.3 mg into the muscle once as needed for up  to 1 dose (if worsening tongue swelling, SOB, hypoxia, or other concerns for progressive anaphylaxis). 09/23/20   Para Skeans, MD  ethyl chloride spray SPRAY 1 SPRAY TO SKIN THREE TIMES A WEEK AS DIRECTED SPRAY A SMALL AMOUNT JUST PRIOR TO NEEDLE INSERTION Patient taking differently: Apply 1 application topically every Tuesday, Thursday, and Saturday at 6 PM. 01/20/21 01/20/22  Stovall, Woodfin Ganja, PA-C  fluticasone (FLONASE) 50 MCG/ACT nasal spray Place 1 spray into both nostrils daily. Patient taking differently: Place 1 spray into both nostrils daily as needed for allergies or rhinitis. 12/31/18   Volanda Napoleon, PA-C  hydrALAZINE (APRESOLINE) 25 MG tablet Take 1.5 tablets (37.5 mg total) by mouth 2 (two) times daily. Patient taking differently: Take 37.5 mg by mouth 3 (three) times daily. 05/11/21   Larey Dresser, MD  iron sucrose in sodium chloride 0.9 % 100 mL Iron Sucrose (Venofer) 02/19/21 02/11/22  [provider]  isosorbide mononitrate (  IMDUR) 30 MG 24 hr tablet TAKE 1 TABLET (30 MG TOTAL) BY MOUTH DAILY. 07/08/21 07/08/22  [name], MD  loratadine (CLARITIN) 10 MG tablet Take 10 mg by mouth daily as needed for allergies or rhinitis. 01/19/21   [provider]  multivitamin (RENA-VIT) TABS tablet TAKE 1 TABLET BY MOUTH ONCE DAILY 05/11/21 05/11/22  Larey Dresser, MD  sildenafil (VIAGRA) 50 MG tablet Take 1 tablet by mouth once a day as needed 30 min prior to sexual activity. Patient taking differently: Take 50 mg by mouth as needed for erectile dysfunction. 06/16/21     traMADol (ULTRAM) 50 MG tablet TAKE 1 TABLET (50 MG TOTAL) BY MOUTH 2 (TWO) TIMES DAILY AS NEEDED. Patient taking differently: Take 50 mg by mouth 2 (two) times daily as needed for severe pain or moderate pain. 02/06/21 08/07/21  [name], MD    Allergies    Ace inhibitors, Influenza vaccines, Ketorolac, Lidocaine, Lisinopril, and Penicillins  Review of Systems   Review of Systems  All other  systems reviewed and are negative.  Physical Exam Updated Vital Signs BP (!) 107/55   Pulse (!) 102   Temp 98.5 F (36.9 C) (Temporal)   Resp (!) 27   SpO2 100%   Physical Exam Vitals and nursing note reviewed.  60 year old male, resting comfortably and in no acute distress. Vital signs are significant for borderline elevated blood pressure. Oxygen saturation is 100%, which is normal. Head is normocephalic and atraumatic. PERRLA, EOMI. tongue is swollen and includes the entire mouth, but there is no drooling.  There is no stridor.  Unable to see the pharynx because of tongue swelling. Neck is nontender and supple without adenopathy or JVD. Back is nontender and there is no CVA tenderness. Lungs are clear without rales, wheezes, or rhonchi. Chest is nontender. Heart has regular rate and rhythm without murmur. Abdomen is soft, flat, nontender without masses or hepatosplenomegaly and peristalsis is normoactive. Extremities have no cyanosis or edema, full range of motion is present.  AV fistula present in the right antecubital space with thrill present. Skin is warm and dry without rash. Neurologic: Mental status is normal, cranial nerves are intact, there are no motor or sensory deficits.  ED Results / Procedures / Treatments   Labs (all labs ordered are listed, but only abnormal results are displayed) Labs Reviewed  CBC WITH DIFFERENTIAL/PLATELET  BASIC METABOLIC PANEL    Procedures Procedures  CRITICAL CARE Performed by: Delora Fuel Total critical care time: 110 minutes Critical care time was exclusive of separately billable procedures and treating other patients. Critical care was necessary to treat or prevent imminent or life-threatening deterioration. Critical care was time spent personally by me on the following activities: development of treatment plan with patient and/or surrogate as well as nursing, discussions with consultants, evaluation of patient's response to  treatment, examination of patient, obtaining history from patient or surrogate, ordering and performing treatments and interventions, ordering and review of laboratory studies, ordering and review of radiographic studies, pulse oximetry and re-evaluation of patient's condition.  Medications Ordered in ED Medications  famotidine (PEPCID) IVPB 20 mg premix (20 mg Intravenous New Bag/Given 07/13/21 2345)  EPINEPHrine (EPI-PEN) injection 0.3 mg (0.3 mg Intramuscular Given 07/13/21 2341)  methylPREDNISolone sodium succinate (SOLU-MEDROL) 125 mg/2 mL injection 125 mg (125 mg Intravenous Given 07/13/21 2343)  diphenhydrAMINE (BENADRYL) injection 25 mg (25 mg Intravenous Given 07/13/21 2342)    ED Course  I have reviewed the triage vital  signs and the nursing notes.  Pertinent lab results that were available during my care of the patient were reviewed by me and considered in my medical decision making (see chart for details).   MDM Rules/Calculators/A&P                         Angioedema of the tongue.  Old records are reviewed, and he has several prior ED visits with similar complaints.  He is given an injection of epinephrine as well as intravenous diphenhydramine, famotidine, methylprednisolone and will need to be observed in the emergency department.  Patient was observed in the emergency department for 5 hours.  There was gradual reduction in the swelling of the tongue.  In this point, he is speaking normally.  He is not having any difficulty with his secretions.  Based on continuous improvement, it is felt he is safe for discharge.  He is discharged with a prescription for prednisone and also for cetirizine.  Given strict return precautions.  He is referred back to his primary care provider, and I feel he would benefit from referral to an allergist.  Final Clinical Impression(s) / ED Diagnoses Final diagnoses:  Angioedema, initial encounter  End-stage renal disease on hemodialysis [facility])    Rx / DC  Orders ED Discharge Orders          Ordered    predniSONE (DELTASONE) 50 MG tablet  Daily        07/14/21 0510    cetirizine (ZYRTEC) 10 MG tablet  Daily        07/14/21 0932             Delora Fuel, MD [birth year] [phone]

## 2021-07-13 NOTE — ED Provider Notes (Signed)
Emergency Medicine Provider Triage Evaluation Note  Victor Thompson , a 60 y.o. male  was evaluated in triage.  Pt complains of tongue swelling on set at 2255 after eating [employer] fish sticks. ESRD patient. Denies use of Lisinopril.   Review of Systems  Positive: Angioedema Negative: Hives  Physical Exam  There were no vitals taken for this visit. Gen:   Awake, no distress   Resp:  Normal effort  MSK:   Moves extremities without difficulty  Other:  Prominent angioedema of tongue. Tolerating secretions.  Medical Decision Making  Medically screening exam initiated at 11:32 PM.  Appropriate orders placed.  [name] was informed that the remainder of the evaluation will be completed by another provider, this initial triage assessment does not replace that evaluation, and the importance of remaining in the ED until their evaluation is complete.  Angioedema.    Antonietta Breach, PA-C 07/13/21 2333    Isla Pence, MD 07/14/21 [phone]

## 2021-07-14 ENCOUNTER — Other Ambulatory Visit: Payer: Self-pay | Admitting: Internal Medicine

## 2021-07-14 ENCOUNTER — Other Ambulatory Visit: Payer: Self-pay

## 2021-07-14 DIAGNOSIS — N186 End stage renal disease: Secondary | ICD-10-CM | POA: Diagnosis not present

## 2021-07-14 DIAGNOSIS — Z992 Dependence on renal dialysis: Secondary | ICD-10-CM | POA: Diagnosis not present

## 2021-07-14 DIAGNOSIS — D631 Anemia in chronic kidney disease: Secondary | ICD-10-CM | POA: Diagnosis not present

## 2021-07-14 DIAGNOSIS — N2581 Secondary hyperparathyroidism of renal origin: Secondary | ICD-10-CM | POA: Diagnosis not present

## 2021-07-14 DIAGNOSIS — D509 Iron deficiency anemia, unspecified: Secondary | ICD-10-CM | POA: Diagnosis not present

## 2021-07-14 DIAGNOSIS — R52 Pain, unspecified: Secondary | ICD-10-CM | POA: Diagnosis not present

## 2021-07-14 LAB — CBC WITH DIFFERENTIAL/PLATELET
Abs Immature Granulocytes: 0.01 10*3/uL (ref 0.00–0.07)
Basophils Absolute: 0 10*3/uL (ref 0.0–0.1)
Basophils Relative: 0 %
Eosinophils Absolute: 0.1 10*3/uL (ref 0.0–0.5)
Eosinophils Relative: 2 %
HCT: 41.6 % (ref 39.0–52.0)
Hemoglobin: 13.9 g/dL (ref 13.0–17.0)
Immature Granulocytes: 0 %
Lymphocytes Relative: 32 %
Lymphs Abs: 1.4 10*3/uL (ref 0.7–4.0)
MCH: 32.6 pg (ref 26.0–34.0)
MCHC: 33.4 g/dL (ref 30.0–36.0)
MCV: 97.4 fL (ref 80.0–100.0)
Monocytes Absolute: 0.6 10*3/uL (ref 0.1–1.0)
Monocytes Relative: 13 %
Neutro Abs: 2.3 10*3/uL (ref 1.7–7.7)
Neutrophils Relative %: 53 %
Platelets: 250 10*3/uL (ref 150–400)
RBC: 4.27 MIL/uL (ref 4.22–5.81)
RDW: 15.1 % (ref 11.5–15.5)
WBC: 4.4 10*3/uL (ref 4.0–10.5)
nRBC: 0 % (ref 0.0–0.2)

## 2021-07-14 LAB — BASIC METABOLIC PANEL
Anion gap: 19 — ABNORMAL HIGH (ref 5–15)
BUN: 49 mg/dL — ABNORMAL HIGH (ref 6–20)
CO2: 27 mmol/L (ref 22–32)
Calcium: 8.8 mg/dL — ABNORMAL LOW (ref 8.9–10.3)
Chloride: 93 mmol/L — ABNORMAL LOW (ref 98–111)
Creatinine, Ser: 14.84 mg/dL — ABNORMAL HIGH (ref 0.61–1.24)
GFR, Estimated: 3 mL/min — ABNORMAL LOW (ref >60)
Glucose, Bld: 104 mg/dL — ABNORMAL HIGH (ref 70–99)
Potassium: 3.9 mmol/L (ref 3.5–5.1)
Sodium: 139 mmol/L (ref 135–145)

## 2021-07-14 MED ORDER — EPINEPHRINE 0.3 MG/0.3ML IJ SOAJ
0.3000 mg | Freq: Once | INTRAMUSCULAR | 0 refills | Status: DC | PRN
  Filled 2021-07-14: qty 1, fill #0

## 2021-07-14 MED ORDER — CETIRIZINE HCL 10 MG PO TABS
10.0000 mg | ORAL_TABLET | Freq: Every day | ORAL | 0 refills | Status: DC
  Filled 2021-07-14: qty 10, 10d supply, fill #0

## 2021-07-14 MED ORDER — ACETAMINOPHEN 160 MG/5ML PO SOLN
650.0000 mg | Freq: Once | ORAL | Status: AC
  Administered 2021-07-14: 650 mg via ORAL
  Filled 2021-07-14: qty 20.3

## 2021-07-14 MED ORDER — PREDNISONE 10 MG PO TABS
50.0000 mg | ORAL_TABLET | Freq: Every day | ORAL | 0 refills | Status: DC
  Filled 2021-07-14 (×2): qty 25, 5d supply, fill #0

## 2021-07-14 NOTE — Telephone Encounter (Signed)
Requested medication (s) are due for refill today:   Not sure he is still on this  Requested medication (s) are on the active medication list:   Given during an ED visit  Future visit scheduled:   Yes with Dr. [name]   Last ordered: 09/23/2020 1 with no refills  Returned because this was prescribed as a one time dose from an ED visit.   Requested Prescriptions  Pending Prescriptions Disp Refills   EPINEPHrine 0.3 mg/0.3 mL IJ SOAJ injection 1 each 0    Sig: Inject 0.3 mg into the muscle once as needed for up to 1 dose (if worsening tongue swelling, SOB, hypoxia, or other concerns for progressive anaphylaxis).      Immunology: Antidotes Passed - 07/14/2021  2:15 PM      Passed - Valid encounter within last 12 months    Recent Outpatient Visits           1 month ago Obstructive sleep apnea   [place], MD   5 months ago Essential hypertension   [place], MD   6 months ago COVID-19 virus infection   [place], Dionne Bucy, [place]   9 months ago Hospital discharge follow-up   [place], Deborah B, MD   1 year ago Hospital discharge follow-up   [place], MD       Future Appointments             In 1 month [name] Dalbert Batman, MD [place]

## 2021-07-14 NOTE — Discharge Instructions (Addendum)
Talk with your primary care provider about getting referred to an allergist to try to find out what is triggering these attacks.  Return immediately if your swelling seems to be getting worse.

## 2021-07-15 ENCOUNTER — Other Ambulatory Visit: Payer: Self-pay

## 2021-07-15 ENCOUNTER — Other Ambulatory Visit: Payer: Self-pay | Admitting: Internal Medicine

## 2021-07-15 MED ORDER — EPINEPHRINE 0.3 MG/0.3ML IJ SOAJ
0.3000 mg | Freq: Once | INTRAMUSCULAR | 2 refills | Status: DC | PRN
  Filled 2021-07-15: qty 2, 2d supply, fill #0

## 2021-07-15 NOTE — Progress Notes (Signed)
HPI male former smoker followed for Angioedema, OSA, complicated by CKD, HBP Unattended Home Sleep Test 05/18/2016-AHI 60.7/hour, desaturation to 79%, body weight 289.5 pounds  =====================================================================   06/01/2016-60 year old male former smoker followed for Angioedema, OSA, complicated by CKD, HBP FOLLOW FOR:  Sleep Study results.  Unattended Home Sleep Test 05/18/2016-AHI 60.7/hour, desaturation to 79%, body weight 289.5 pounds We reviewed his sleep study and discussed medical significance. He needed the study to document ongoing need for CPAP so he could replace his old machine. He is still been trying to use his old machine but he says he "fights with it every night". No angioedema since last here.  07/16/21- 76yoM former smoker coming to re-establish for OSA, complicated by hx Angioedema, CKD,/ ESRD/Dialysis,  HBP, CAD/M/ AICD, CHF, CM, Renal Hyperparathyroidism, Morbid Obesity,  Epworth score -2 CPAP- 5-20/ [facility] Download- Body weight today- 260 lbs Covid vax- ED visit 8/8 for tongue swelling> Pepcid, Epinephrine, Solumedrol, Benadryl,  ------Re-establish care for OSA. States he needs a new cpap machine. His current machine is working but he is unsure of how much longer it will work.  He has been using CPAP every night. Housing is beginning to break and [employer] told him replacement would take 5 months. Discussed supply chain, alternative providers.  Angioedema- he asked for help seeing an allergist after recent ER visit CXR 01/11/21- IMPRESSION: No active cardiopulmonary disease  ROS-see HPI  + = positive Constitutional:    weight loss, night sweats, fevers, chills, fatigue, lassitude. HEENT:    headaches, difficulty swallowing, tooth/dental problems, sore throat,       sneezing, itching, ear ache, nasal congestion, post nasal drip, snoring CV:    chest pain, orthopnea, PND, swelling in lower extremities, anasarca,                                                         dizziness, palpitations Resp:   shortness of breath with exertion or at rest.                productive cough,   non-productive cough, coughing up of blood.              change in color of mucus.  wheezing.   Skin:   Clear GI:  No-   heartburn, indigestion, abdominal pain, nausea, vomiting, diarrhea,                 change in bowel habits, loss of appetite GU: dysuria, change in color of urine, no urgency or frequency.   flank pain. MS:   joint pain, stiffness, decreased range of motion, back pain. Neuro-     nothing unusual Psych:  change in mood or affect.  depression or anxiety.   memory loss.  OBJ- Physical Exam General- Alert, Oriented, Affect-appropriate, Distress- none acute, + obese Skin-clear Lymphadenopathy- none Head- atraumatic            Eyes- Gross vision intact, PERRLA, conjunctivae and secretions clear            Ears- Hearing, canals-normal            Nose- Clear, no-Septal dev, mucus, polyps, erosion, perforation             Throat- Mallampati IV , mucosa clear , drainage- none, tonsils- atrophic Neck- flexible , trachea midline, no  stridor , thyroid nl, carotid no bruit Chest - symmetrical excursion , unlabored           Heart/CV- RRR , no murmur , no gallop  , no rub, nl s1 s2                           - JVD- none , edema- none, stasis changes- none, varices- none           Lung- clear to P&A, wheeze- none, cough- none , dullness-none, rub- none           Chest wall-  Abd-  Br/ Gen/ Rectal- Not done, not indicated Extrem- cyanosis- none, clubbing, none, atrophy- none, strength- nl Neuro- grossly intact to observation,

## 2021-07-16 ENCOUNTER — Ambulatory Visit (INDEPENDENT_AMBULATORY_CARE_PROVIDER_SITE_OTHER): Payer: Medicare Other | Admitting: Internal Medicine

## 2021-07-16 ENCOUNTER — Encounter: Payer: Self-pay | Admitting: Internal Medicine

## 2021-07-16 ENCOUNTER — Telehealth: Payer: Self-pay | Admitting: Internal Medicine

## 2021-07-16 ENCOUNTER — Other Ambulatory Visit: Payer: Self-pay

## 2021-07-16 VITALS — BP 128/74 | HR 74 | Ht 67.0 in | Wt 260.6 lb

## 2021-07-16 DIAGNOSIS — R52 Pain, unspecified: Secondary | ICD-10-CM | POA: Diagnosis not present

## 2021-07-16 DIAGNOSIS — G4733 Obstructive sleep apnea (adult) (pediatric): Secondary | ICD-10-CM | POA: Diagnosis not present

## 2021-07-16 DIAGNOSIS — Z992 Dependence on renal dialysis: Secondary | ICD-10-CM | POA: Diagnosis not present

## 2021-07-16 DIAGNOSIS — D509 Iron deficiency anemia, unspecified: Secondary | ICD-10-CM | POA: Diagnosis not present

## 2021-07-16 DIAGNOSIS — T783XXD Angioneurotic edema, subsequent encounter: Secondary | ICD-10-CM

## 2021-07-16 DIAGNOSIS — D631 Anemia in chronic kidney disease: Secondary | ICD-10-CM | POA: Diagnosis not present

## 2021-07-16 DIAGNOSIS — N186 End stage renal disease: Secondary | ICD-10-CM | POA: Diagnosis not present

## 2021-07-16 DIAGNOSIS — N2581 Secondary hyperparathyroidism of renal origin: Secondary | ICD-10-CM | POA: Diagnosis not present

## 2021-07-16 NOTE — Assessment & Plan Note (Signed)
Referring to Allergist for recurrent angioedema

## 2021-07-16 NOTE — Telephone Encounter (Signed)
Call placed to patient.  He explained that he is now receiving disability - $866/month.  He is currently living with his first cousin but would like a place of his own.  He said he has already contacted the [employer] and [employer].  Informed him that this CM can print him a list of available rentals from social [email or web address].  He said that his rent limit would be $500/month. The list was printed and mailed to him as he requested. Also included with the mailing was the contact number for DSS- employment for individuals receiving disability. He said he would like to be able to some sort of work but he is not sure exactly what he could do.  He can call DSS to discuss possible job options.

## 2021-07-16 NOTE — Assessment & Plan Note (Signed)
Benefits and compliant with CPAP. Needs replacement and concerned [facility] will take too long "5 months" so interested in changing DME Plan- replace CPAP auto 5-20

## 2021-07-16 NOTE — Patient Instructions (Addendum)
Order- [facility]- Patient would like to change DME company from [place] to get replacement machine ASAP Needs replacement for old CPAP machine he has been using every night. Auto 5-20, mask of choice, humidifier, supplies, Airiew/ card.  Order- referral to [facility] on [address]   dx recurrent angioedema  Please call if we can help

## 2021-07-16 NOTE — Telephone Encounter (Signed)
Copied from [place] [id number]. Topic: General - Other >> Jul 14, 2021  2:19 PM Celene Kras wrote: Reason for CRM: Pt called and is requesting to speak with Opal Sidles regarding work while on disability and housing. Please advise.

## 2021-07-17 NOTE — Progress Notes (Signed)
Electrophysiology Office Note Date: 07/20/2021  ID:  Victor Thompson, DOB 01/06/1961, MRN [id number]  PCP: [name], MD Primary Cardiologist: None Electrophysiologist: [name], MD   CC: Routine ICD follow-up  Victor Thompson is a 60 y.o. male seen today for [name], MD for routine electrophysiology followup.  Since last being seen in our clinic the patient reports doing very well since starting HD. Volume status much better controlled. Currently exercising most days by walking without any difficulty.  he denies chest pain, palpitations, dyspnea, PND, orthopnea, nausea, vomiting, dizziness, syncope, edema, weight gain, or early satiety. He has not had ICD shocks.   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   Past Medical History:  Diagnosis Date   AICD (automatic cardioverter/defibrillator) present    2015   Asthma    no meds   Cardiac arrest ([place]) 2015   CHF (congestive heart failure) (HCC)    Chronic kidney disease    ckd -stage 5   Coronary artery disease    Hypertension    Myocardial infarction ([place])    Obesity    Pneumonia    Pulmonary edema    Shortness of breath dyspnea    Sleep apnea    USES CPAP   Wears glasses    Past Surgical History:  Procedure Laterality Date   AV FISTULA PLACEMENT Right 03/15/2019   Procedure: RIGHT ARM ARTERIOVENOUS (AV) FISTULA CREATION;  Surgeon: Angelia Mould, MD;  Location: [facility];  Service: Vascular;  Laterality: Right;   COLONOSCOPY     COLONOSCOPY W/ BIOPSIES AND POLYPECTOMY     CORONARY STENT PLACEMENT  2007   IMPLANTABLE CARDIOVERTER DEFIBRILLATOR IMPLANT  2015   RIGHT/LEFT HEART CATH AND CORONARY ANGIOGRAPHY N/A 07/08/2021   Procedure: RIGHT/LEFT HEART CATH AND CORONARY ANGIOGRAPHY;  Surgeon: Larey Dresser, MD;  Location: [facility];  Service: Cardiovascular;  Laterality: N/A;   SUBQ ICD CHANGEOUT N/A 10/13/2020   Procedure: SUBQ ICD  CHANGEOUT;  Surgeon: Evans Lance, MD;  Location: [facility];  Service: Cardiovascular;  Laterality: N/A;    Current Outpatient Medications  Medication Sig Dispense Refill   acetaminophen (TYLENOL) 500 MG tablet Take 1,000 mg by mouth every 8 (eight) hours as needed for mild pain, moderate pain or headache.     albuterol (PROVENTIL) (2.5 MG/3ML) 0.083% nebulizer solution TAKE 3 MLS (2.5 MG TOTAL) BY NEBULIZATION EVERY 6 (SIX) HOURS AS NEEDED FOR WHEEZING OR SHORTNESS OF BREATH. 75 mL 12   albuterol (VENTOLIN HFA) 108 (90 Base) MCG/ACT inhaler Inhale 2 puffs into the lungs every 4 (four) hours as needed for wheezing or shortness of breath. 8 g 6   aspirin EC 81 MG tablet Take 1 tablet (81 mg total) by mouth daily. 30 tablet 5   atorvastatin (LIPITOR) 40 MG tablet TAKE 1 TABLET (40 MG TOTAL) BY MOUTH AT BEDTIME. 30 tablet 2   AURYXIA 1 GM 210 MG(Fe) tablet Take 210 mg by mouth 3 (three) times daily.     budesonide-formoterol (SYMBICORT) 80-4.5 MCG/ACT inhaler Inhale 2 puffs into the lungs 2 (two) times daily. 1 Inhaler 6   calcitRIOL (ROCALTROL) 0.5 MCG capsule TAKE 1 CAPSULE BY MOUTH ONCE DAILY 30 capsule 11   calcium acetate (PHOSLO) 667 MG capsule Take 2 capsule by mouth three times a day with meals 180 capsule 6   carvedilol (COREG) 25 MG tablet TAKE 1 TABLET (25 MG TOTAL) BY  MOUTH 2 (TWO) TIMES DAILY WITH A MEAL. 180 tablet 1   cetirizine (ZYRTEC) 10 MG tablet Take 1 tablet (10 mg total) by mouth daily. 10 tablet 0   Cholecalciferol (VITAMIN D-3) 125 MCG (5000 UT) TABS Take 5,000 Units by mouth daily.     doxercalciferol (HECTOROL) 0.5 MCG capsule Doxercalciferol (Hectorol)     EPINEPHrine 0.3 mg/0.3 mL IJ SOAJ injection Inject 0.3 mg into the muscle once as needed for up to 2 doses (if worsening tongue swelling, SOB, hypoxia, or other concerns for progressive anaphylaxis). 2 each 2   ethyl chloride spray SPRAY 1 SPRAY TO SKIN THREE TIMES A WEEK AS DIRECTED SPRAY A SMALL AMOUNT JUST  PRIOR TO NEEDLE INSERTION 116 mL 11   fluticasone (FLONASE) 50 MCG/ACT nasal spray Place 1 spray into both nostrils daily. 16 g 2   hydrALAZINE (APRESOLINE) 25 MG tablet Take 1.5 tablets (37.5 mg total) by mouth 2 (two) times daily. 90 tablet 3   iron sucrose in sodium chloride 0.9 % 100 mL Iron Sucrose (Venofer)     isosorbide mononitrate (IMDUR) 30 MG 24 hr tablet TAKE 1 TABLET (30 MG TOTAL) BY MOUTH DAILY. 90 tablet 0   loratadine (CLARITIN) 10 MG tablet Take 10 mg by mouth daily as needed for allergies or rhinitis.     multivitamin (RENA-VIT) TABS tablet TAKE 1 TABLET BY MOUTH ONCE DAILY 30 tablet 11   predniSONE (DELTASONE) 10 MG tablet Take 5 tablets (50 mg total) by mouth daily. 25 tablet 0   sildenafil (VIAGRA) 50 MG tablet Take 1 tablet by mouth once a day as needed 30 min prior to sexual activity. 30 tablet 0   traMADol (ULTRAM) 50 MG tablet TAKE 1 TABLET (50 MG TOTAL) BY MOUTH 2 (TWO) TIMES DAILY AS NEEDED. 60 tablet 1   No current facility-administered medications for this visit.    Allergies:   Ace inhibitors, Influenza vaccines, Ketorolac, Lidocaine, Lisinopril, and Penicillins   Social History: Social History   Socioeconomic History   Marital status: Single    Spouse name: Not on file   Number of children: Not on file   Years of education: Not on file   Highest education level: Not on file  Occupational History   Not on file  Tobacco Use   Smoking status: Never   Smokeless tobacco: Never  Vaping Use   Vaping Use: Never used  Substance and Sexual Activity   Alcohol use: No   Drug use: No   Sexual activity: Yes  Other Topics Concern   Not on file  Social History Narrative   ** Merged History Encounter **       Social Determinants of Health   Financial Resource Strain: Not on file  Food Insecurity: Not on file  Transportation Needs: Not on file  Physical Activity: Not on file  Stress: Not on file  Social Connections: Not on file  Intimate Partner  Violence: Not on file    Family History: Family History  Problem Relation Age of Onset   Hypertension Mother    Liver disease Father     Review of Systems: All other systems reviewed and are otherwise negative except as noted above.   Physical Exam: Vitals:   07/20/21 0858  BP: 128/82  Pulse: 82  SpO2: 97%  Weight: 266 lb 6.4 oz (120.8 kg)  Height: 5\' 7"  (1.702 m)     GEN- The patient is well appearing, alert and oriented x 3 today.   HEENT: normocephalic,  atraumatic; sclera clear, conjunctiva pink; hearing intact; oropharynx clear; neck supple, no JVP Lymph- no cervical lymphadenopathy Lungs- Clear to ausculation bilaterally, normal work of breathing.  No wheezes, rales, rhonchi Heart- Regular rate and rhythm, no murmurs, rubs or gallops, PMI not laterally displaced GI- soft, non-tender, non-distended, bowel sounds present, no hepatosplenomegaly Extremities- no clubbing or cyanosis. No edema; DP/PT/radial pulses 2+ bilaterally MS- no significant deformity or atrophy Skin- warm and dry, no rash or lesion; ICD pocket well healed Psych- euthymic mood, full affect Neuro- strength and sensation are intact  ICD interrogation- reviewed in detail today,  See PACEART report  EKG:  EKG is not ordered today. Personal review of EKG ordered  07/13/21  shows sinus tachycardia at 102 bpm  Recent Labs: 01/11/2021: B Natriuretic Peptide 870.8 07/13/2021: BUN 49; Creatinine, Ser 14.84; Hemoglobin 13.9; Platelets 250; Potassium 3.9; Sodium 139   Wt Readings from Last 3 Encounters:  07/20/21 266 lb 6.4 oz (120.8 kg)  07/16/21 260 lb 9.6 oz (118.2 kg)  07/08/21 251 lb (113.9 kg)     Other studies Reviewed: Additional studies/ records that were reviewed today include: Previous EP office notes   Assessment and Plan:  1.  Chronic systolic dysfunction/ ischemic CM Echo 01/12/21 LVEF 25-30% Volume status stable on exam. NYHA II-III Stable on an appropriate medical regimen Normal S-ICD  function See P[facility] Art report No changes today He has QRS < 150 msec, we have therefore not advised CRT upgrade    2. Prior VF arrest Normal ICD function  K stable 07/13/2021  3. ESRD on HD T/Th/Saturday  4. "AF" episodes on S-ICD IF true, burden is low and last episode 04/2021. If burden increased would likely need to confirm with monitor.  With ESRD, would need to go on Eliquis. For now, continue to follow burden.   Current medicines are reviewed at length with the patient today.   The patient does not have concerns regarding his medicines.  The following changes were made today:  none  Labs/ tests ordered today include:  Labs from 07/13/21 reviewed and stable. Cr elevated on HD.  Disposition:   Follow up with EP APP  6 months  Signed, Shirley Friar, PA-C  07/20/2021 9:18 AM  [facility] [address] [place] [address] [phone] (office) [phone] (fax)

## 2021-07-18 DIAGNOSIS — Z992 Dependence on renal dialysis: Secondary | ICD-10-CM | POA: Diagnosis not present

## 2021-07-18 DIAGNOSIS — N186 End stage renal disease: Secondary | ICD-10-CM | POA: Diagnosis not present

## 2021-07-18 DIAGNOSIS — R52 Pain, unspecified: Secondary | ICD-10-CM | POA: Diagnosis not present

## 2021-07-18 DIAGNOSIS — D631 Anemia in chronic kidney disease: Secondary | ICD-10-CM | POA: Diagnosis not present

## 2021-07-18 DIAGNOSIS — D509 Iron deficiency anemia, unspecified: Secondary | ICD-10-CM | POA: Diagnosis not present

## 2021-07-18 DIAGNOSIS — N2581 Secondary hyperparathyroidism of renal origin: Secondary | ICD-10-CM | POA: Diagnosis not present

## 2021-07-18 MED FILL — Ethyl Chloride Aerosol Spray: CUTANEOUS | 30 days supply | Qty: 116 | Fill #6 | Status: AC

## 2021-07-20 ENCOUNTER — Other Ambulatory Visit: Payer: Self-pay

## 2021-07-20 ENCOUNTER — Encounter: Payer: Self-pay | Admitting: Student

## 2021-07-20 ENCOUNTER — Ambulatory Visit (HOSPITAL_COMMUNITY)
Admission: RE | Admit: 2021-07-20 | Discharge: 2021-07-20 | Disposition: A | Discharge status: Home / Self Care | Payer: Medicare Other | Source: Ambulatory Visit | Attending: Cardiology | Admitting: Cardiology

## 2021-07-20 ENCOUNTER — Ambulatory Visit (INDEPENDENT_AMBULATORY_CARE_PROVIDER_SITE_OTHER): Payer: Medicare Other | Admitting: Student

## 2021-07-20 VITALS — BP 128/82 | HR 82 | Ht 67.0 in | Wt 266.4 lb

## 2021-07-20 DIAGNOSIS — I472 Ventricular tachycardia, unspecified: Secondary | ICD-10-CM

## 2021-07-20 DIAGNOSIS — X58XXXA Exposure to other specified factors, initial encounter: Secondary | ICD-10-CM | POA: Insufficient documentation

## 2021-07-20 DIAGNOSIS — Z992 Dependence on renal dialysis: Secondary | ICD-10-CM

## 2021-07-20 DIAGNOSIS — I5042 Chronic combined systolic (congestive) and diastolic (congestive) heart failure: Secondary | ICD-10-CM

## 2021-07-20 DIAGNOSIS — I255 Ischemic cardiomyopathy: Secondary | ICD-10-CM | POA: Diagnosis not present

## 2021-07-20 DIAGNOSIS — N186 End stage renal disease: Secondary | ICD-10-CM | POA: Diagnosis not present

## 2021-07-20 DIAGNOSIS — I132 Hypertensive heart and chronic kidney disease with heart failure and with stage 5 chronic kidney disease, or end stage renal disease: Secondary | ICD-10-CM | POA: Diagnosis not present

## 2021-07-20 DIAGNOSIS — I2510 Atherosclerotic heart disease of native coronary artery without angina pectoris: Secondary | ICD-10-CM

## 2021-07-20 DIAGNOSIS — E785 Hyperlipidemia, unspecified: Secondary | ICD-10-CM | POA: Insufficient documentation

## 2021-07-20 DIAGNOSIS — Z79899 Other long term (current) drug therapy: Secondary | ICD-10-CM | POA: Insufficient documentation

## 2021-07-20 DIAGNOSIS — Z8674 Personal history of sudden cardiac arrest: Secondary | ICD-10-CM | POA: Diagnosis not present

## 2021-07-20 DIAGNOSIS — Z7951 Long term (current) use of inhaled steroids: Secondary | ICD-10-CM | POA: Insufficient documentation

## 2021-07-20 DIAGNOSIS — Z8616 Personal history of COVID-19: Secondary | ICD-10-CM | POA: Diagnosis not present

## 2021-07-20 DIAGNOSIS — Z8249 Family history of ischemic heart disease and other diseases of the circulatory system: Secondary | ICD-10-CM | POA: Diagnosis not present

## 2021-07-20 DIAGNOSIS — Z95810 Presence of automatic (implantable) cardiac defibrillator: Secondary | ICD-10-CM | POA: Insufficient documentation

## 2021-07-20 DIAGNOSIS — I447 Left bundle-branch block, unspecified: Secondary | ICD-10-CM | POA: Insufficient documentation

## 2021-07-20 DIAGNOSIS — G4733 Obstructive sleep apnea (adult) (pediatric): Secondary | ICD-10-CM | POA: Insufficient documentation

## 2021-07-20 DIAGNOSIS — T783XXA Angioneurotic edema, initial encounter: Secondary | ICD-10-CM | POA: Diagnosis not present

## 2021-07-20 DIAGNOSIS — Z7982 Long term (current) use of aspirin: Secondary | ICD-10-CM | POA: Diagnosis not present

## 2021-07-20 LAB — CUP PACEART INCLINIC DEVICE CHECK
Date Time Interrogation Session: 20220815100306
Implantable Lead Implant Date: 20150507
Implantable Lead Implant Date: 20150507
Implantable Lead Location: 753862
Implantable Lead Location: 753862
Implantable Lead Model: 3400
Implantable Lead Model: 3400
Implantable Pulse Generator Implant Date: 20211108
Pulse Gen Serial Number: 146303

## 2021-07-20 MED ORDER — HYDRALAZINE HCL 25 MG PO TABS
75.0000 mg | ORAL_TABLET | Freq: Three times a day (TID) | ORAL | 11 refills | Status: DC
  Filled 2021-07-20: qty 270, 30d supply, fill #0
  Filled 2021-12-01: qty 270, 30d supply, fill #1

## 2021-07-20 MED ORDER — ISOSORBIDE MONONITRATE ER 60 MG PO TB24
60.0000 mg | ORAL_TABLET | Freq: Every day | ORAL | 3 refills | Status: DC
  Filled 2021-07-20: qty 90, 90d supply, fill #0
  Filled 2021-12-01: qty 90, 90d supply, fill #1

## 2021-07-20 NOTE — Patient Instructions (Signed)
Medication Instructions:  Your physician recommends that you continue on your current medications as directed. Please refer to the Current Medication list given to you today.  *If you need a refill on your cardiac medications before your next appointment, please call your pharmacy*   Lab Work: None If you have labs (blood work) drawn today and your tests are completely normal, you will receive your results only by: MyChart Message (if you have MyChart) OR A paper copy in the mail If you have any lab test that is abnormal or we need to change your treatment, we will call you to review the results.   Follow-Up: At [facility], you and your health needs are our priority.  As part of our continuing mission to provide you with exceptional heart care, we have created designated Provider Care Teams.  These Care Teams include your primary Cardiologist (physician) and Advanced Practice Providers (APPs -  Physician Assistants and Nurse Practitioners) who all work together to provide you with the care you need, when you need it.  We recommend signing up for the patient portal called "MyChart".  Sign up information is provided on this After Visit Summary.  MyChart is used to connect with patients for Virtual Visits (Telemedicine).  Patients are able to view lab/test results, encounter notes, upcoming appointments, etc.  Non-urgent messages can be sent to your provider as well.   To learn more about what you can do with MyChart, go to [email or web address].    Your next appointment:   6 month(s)  The format for your next appointment:   In Person  Provider:   You will see one of the following Advanced Practice Providers on your designated Care Team:    Renee Ursuy, PA-C Michael "Andy" Tillery, PA-C  

## 2021-07-20 NOTE — Patient Instructions (Signed)
EKG done today.  No Labs done today.   INCREASE Hydralazine to 75mg  (3 tablets) by mouth 3 times daily.  INCREASE Imdur to 60mg  (1 tablet) by mouth daily.   No other medication changes were made. Please continue all current medications as prescribed.  Your physician recommends that you schedule a follow-up appointment in: 1 month with our Clinic Pharmacist and in  3 months with an echo prior to your exam.  If you have any questions or concerns before your next appointment please send [place] a message through [place] or call our office at [phone].    TO LEAVE A MESSAGE FOR THE NURSE SELECT OPTION 2, PLEASE LEAVE A MESSAGE INCLUDING: YOUR NAME DATE OF BIRTH CALL BACK NUMBER REASON FOR CALL**this is important as we prioritize the call backs  YOU WILL RECEIVE A CALL BACK THE SAME DAY AS LONG AS YOU CALL BEFORE 4:00 PM   Do the following things EVERYDAY: Weigh yourself in the morning before breakfast. Write it down and keep it in a log. Take your medicines as prescribed Eat low salt foods--Limit salt (sodium) to 2000 mg per day.  Stay as active as you can everyday Limit all fluids for the day to less than 2 liters   At the [facility], you and your health needs are our priority. As part of our continuing mission to provide you with exceptional heart care, we have created designated Provider Care Teams. These Care Teams include your primary Cardiologist (physician) and Advanced Practice Providers (APPs- Physician Assistants and Nurse Practitioners) who all work together to provide you with the care you need, when you need it.   You may see any of the following providers on your designated Care Team at your next follow up: Dr Glori Bickers Dr Haynes Kerns, NP Lyda Jester, [place] Audry Riles, PharmD   Please be sure to bring in all your medications bottles to every appointment.

## 2021-07-20 NOTE — Progress Notes (Signed)
PCP: [name], MD Cardiology: Dr. Radford Pax HF cardiology: Dr. Aundra Dubin  60 y.o. with history of CAD, ischemic CMP, and ESRD was referred by Dr. Radford Pax for evaluation of CHF.  He had PCI in 8315, uncertain what vessel was involved.  He has not had coronary angiography since that time due to advanced CKD.  He has had ischemic cardiomyopathy x years.  Most recent echo in 2/22 showed EF 25-30% with moderate LV dilation and normal RV.  He had VT arrest in 2015, has St Jude subcutaneous ICD.  [name] in 2/22.  LHC/RHC was done in 8/22, showing no significant CAD and stable hemodynamics.   He returns for followup of CHF.  He was in the ER on 07/13/21 with an episode of angioedema, has had in the past as well.  Generally feeling good, no significant dyspnea with usual activities.  No orthopnea/PND.  No lightheadedness.  He is tolerating dialysis.   ECG (personally reviewed): NSR, LBBB 144 msec  Labs (6/22): LDL 75 Labs (8/22): hgb 13.9  PMH: 1. CAD: PCI in 1761, uncertain what vessel.  - Cardiolite 2017: Inferior and inferolateral large fixed defect suggestive of prior infarction, EF 28%.  - LHC (8/22): No significant CAD.  2. Chronic systolic CHF: Ischemic cardiomyopathy. St Jude subcutaneous ICD.  - Echo (7/20) with EF 25-30%.  - Echo (10/20) with EF 40-45% - Echo (2/22): EF 25-30%, moderate LV dilation, mild LVH, normal RV size and systolic function, mild MR.  - LHC/RHC (8/22): mean RA 2, PA 24/3, mean PCWP 9, CI 3.13; no significant coronary disease.  3. ESRD since 2/22 4. OSA on CPAP 5. H/o VT arrest: Subcutaneous St Jude ICD placed in 2015 at [facility].  6. COVID-19 1/22.  7. HTN 8. Hyperlipidemia 9. Recurrent angioedema.   Social History   Socioeconomic History   Marital status: Single    Spouse name: Not on file   Number of children: Not on file   Years of education: Not on file   Highest education level: Not on file  Occupational History   Not on file  Tobacco Use    Smoking status: Never   Smokeless tobacco: Never  Vaping Use   Vaping Use: Never used  Substance and Sexual Activity   Alcohol use: No   Drug use: No   Sexual activity: Yes  Other Topics Concern   Not on file  Social History Narrative   ** Merged History Encounter **       Social Determinants of Health   Financial Resource Strain: Not on file  Food Insecurity: Not on file  Transportation Needs: Not on file  Physical Activity: Not on file  Stress: Not on file  Social Connections: Not on file  Intimate Partner Violence: Not on file   Family History  Problem Relation Age of Onset   Hypertension Mother    Liver disease Father    ROS: All systems reviewed and negative except as per HPI.   Current Outpatient Medications  Medication Sig Dispense Refill   acetaminophen (TYLENOL) 500 MG tablet Take 1,000 mg by mouth every 8 (eight) hours as needed for mild pain, moderate pain or headache.     albuterol (PROVENTIL) (2.5 MG/3ML) 0.083% nebulizer solution TAKE 3 MLS (2.5 MG TOTAL) BY NEBULIZATION EVERY 6 (SIX) HOURS AS NEEDED FOR WHEEZING OR SHORTNESS OF BREATH. 75 mL 12   albuterol (VENTOLIN HFA) 108 (90 Base) MCG/ACT inhaler Inhale 2 puffs into the lungs every 4 (four) hours  as needed for wheezing or shortness of breath. 8 g 6   aspirin EC 81 MG tablet Take 1 tablet (81 mg total) by mouth daily. 30 tablet 5   atorvastatin (LIPITOR) 40 MG tablet TAKE 1 TABLET (40 MG TOTAL) BY MOUTH AT BEDTIME. 30 tablet 2   AURYXIA 1 GM 210 MG(Fe) tablet Take 210 mg by mouth 3 (three) times daily.     budesonide-formoterol (SYMBICORT) 80-4.5 MCG/ACT inhaler Inhale 2 puffs into the lungs 2 (two) times daily. 1 Inhaler 6   calcitRIOL (ROCALTROL) 0.5 MCG capsule TAKE 1 CAPSULE BY MOUTH ONCE DAILY 30 capsule 11   calcium acetate (PHOSLO) 667 MG capsule Take 2 capsule by mouth three times a day with meals 180 capsule 6   carvedilol (COREG) 25 MG tablet TAKE 1 TABLET (25 MG TOTAL) BY MOUTH 2 (TWO) TIMES  DAILY WITH A MEAL. 180 tablet 1   cetirizine (ZYRTEC) 10 MG tablet Take 1 tablet (10 mg total) by mouth daily. 10 tablet 0   Cholecalciferol (VITAMIN D-3) 125 MCG (5000 UT) TABS Take 5,000 Units by mouth daily.     doxercalciferol (HECTOROL) 0.5 MCG capsule Doxercalciferol (Hectorol)     EPINEPHrine 0.3 mg/0.3 mL IJ SOAJ injection Inject 0.3 mg into the muscle once as needed for up to 2 doses (if worsening tongue swelling, SOB, hypoxia, or other concerns for progressive anaphylaxis). 2 each 2   ethyl chloride spray SPRAY 1 SPRAY TO SKIN THREE TIMES A WEEK AS DIRECTED SPRAY A SMALL AMOUNT JUST PRIOR TO NEEDLE INSERTION 116 mL 11   fluticasone (FLONASE) 50 MCG/ACT nasal spray Place 1 spray into both nostrils daily. 16 g 2   iron sucrose in sodium chloride 0.9 % 100 mL Iron Sucrose (Venofer)     loratadine (CLARITIN) 10 MG tablet Take 10 mg by mouth daily as needed for allergies or rhinitis.     multivitamin (RENA-VIT) TABS tablet TAKE 1 TABLET BY MOUTH ONCE DAILY 30 tablet 11   predniSONE (DELTASONE) 10 MG tablet Take 5 tablets (50 mg total) by mouth daily. 25 tablet 0   sildenafil (VIAGRA) 50 MG tablet Take 1 tablet by mouth once a day as needed 30 min prior to sexual activity. 30 tablet 0   traMADol (ULTRAM) 50 MG tablet TAKE 1 TABLET (50 MG TOTAL) BY MOUTH 2 (TWO) TIMES DAILY AS NEEDED. 60 tablet 1   hydrALAZINE (APRESOLINE) 25 MG tablet Take 3 tablets (75 mg total) by mouth 3 (three) times daily. 270 tablet 11   isosorbide mononitrate (IMDUR) 60 MG 24 hr tablet Take 1 tablet (60 mg total) by mouth daily. 90 tablet 3   No current facility-administered medications for this encounter.   BP (!) 150/70   Pulse 74   Wt 120.8 kg (266 lb 6.4 oz)   SpO2 96%   BMI 41.72 kg/m  General: NAD Neck: No JVD, no thyromegaly or thyroid nodule.  Lungs: Clear to auscultation bilaterally with normal respiratory effort. CV: Nondisplaced PMI.  Heart regular S1/S2, no S3/S4, no murmur.  No peripheral edema.  No  carotid bruit.  Normal pedal pulses.  Abdomen: Soft, nontender, no hepatosplenomegaly, no distention.  Skin: Intact without lesions or rashes.  Neurologic: Alert and oriented x 3.  Psych: Normal affect. Extremities: No clubbing or cyanosis.  HEENT: Normal.   Assessment/Plan: 1. CAD: PCI in 2005, unsure what vessel.  Coronary angiography in 8/22 showed no significant CAD.    - Continue atorvastatin, lipids ok in 6/22.  - Continue  ASA 81 daily.  2. ESRD: Since 2/22.  Turned down for renal transplant due to low EF.  3. Chronic systolic CHF: Ischemic cardiomyopathy, echo in 2/22 with EF 25-30%, moderate LV dilation, mild LVH, normal RV size and systolic function, mild MR.   He has a Research officer, political party subcutaneous ICD.  He is not volume overloaded on exam.  Medication titration limited by ESRD, need to avoid hypotension at HD.  - Continue Coreg 25 mg bid.  - Increase hydralazine to 50 mg tid and Imdur to 60 mg daily.  He knows to avoid taking sildenafil if he has used Imdur (needs to be off Imdur for > 24 hrs if he uses).  - ECG with LBBB 144 msec, thought not to be good candidate for CRT upgrade (QRS < 150 msec).  - Repeat echo at followup in 3 months.  4. OSA: Continue CPAP.  5. Angioedema: Recurrent episodes.  He is not on a med that commonly causes angioedema.  Only Venifer appears to carry any risk.  He has an appointment with an allergist to assess.   Followup in 1 month with HF pharmacist for med titration (hydralazine/Imdur), followup 3 months with me with echo.   Loralie Champagne 07/20/2021

## 2021-07-21 ENCOUNTER — Telehealth: Payer: Self-pay

## 2021-07-21 DIAGNOSIS — D509 Iron deficiency anemia, unspecified: Secondary | ICD-10-CM | POA: Diagnosis not present

## 2021-07-21 DIAGNOSIS — Z992 Dependence on renal dialysis: Secondary | ICD-10-CM | POA: Diagnosis not present

## 2021-07-21 DIAGNOSIS — R52 Pain, unspecified: Secondary | ICD-10-CM | POA: Diagnosis not present

## 2021-07-21 DIAGNOSIS — N186 End stage renal disease: Secondary | ICD-10-CM | POA: Diagnosis not present

## 2021-07-21 DIAGNOSIS — N2581 Secondary hyperparathyroidism of renal origin: Secondary | ICD-10-CM | POA: Diagnosis not present

## 2021-07-21 DIAGNOSIS — D631 Anemia in chronic kidney disease: Secondary | ICD-10-CM | POA: Diagnosis not present

## 2021-07-21 NOTE — Telephone Encounter (Signed)
Call placed to [facility] # [phone] to check on status of CPAP order.  Spoke to [place] who stated this was a re-PAP order and the call was transferred to # [phone].  Spoke to [place] who said that they have the order but still need to work on it.  She will send message to supervisors noting order needs to be processed.

## 2021-07-23 DIAGNOSIS — N186 End stage renal disease: Secondary | ICD-10-CM | POA: Diagnosis not present

## 2021-07-23 DIAGNOSIS — Z992 Dependence on renal dialysis: Secondary | ICD-10-CM | POA: Diagnosis not present

## 2021-07-23 DIAGNOSIS — R52 Pain, unspecified: Secondary | ICD-10-CM | POA: Diagnosis not present

## 2021-07-23 DIAGNOSIS — D631 Anemia in chronic kidney disease: Secondary | ICD-10-CM | POA: Diagnosis not present

## 2021-07-23 DIAGNOSIS — N2581 Secondary hyperparathyroidism of renal origin: Secondary | ICD-10-CM | POA: Diagnosis not present

## 2021-07-23 DIAGNOSIS — D509 Iron deficiency anemia, unspecified: Secondary | ICD-10-CM | POA: Diagnosis not present

## 2021-07-25 DIAGNOSIS — R52 Pain, unspecified: Secondary | ICD-10-CM | POA: Diagnosis not present

## 2021-07-25 DIAGNOSIS — D509 Iron deficiency anemia, unspecified: Secondary | ICD-10-CM | POA: Diagnosis not present

## 2021-07-25 DIAGNOSIS — N186 End stage renal disease: Secondary | ICD-10-CM | POA: Diagnosis not present

## 2021-07-25 DIAGNOSIS — Z992 Dependence on renal dialysis: Secondary | ICD-10-CM | POA: Diagnosis not present

## 2021-07-25 DIAGNOSIS — N2581 Secondary hyperparathyroidism of renal origin: Secondary | ICD-10-CM | POA: Diagnosis not present

## 2021-07-25 DIAGNOSIS — D631 Anemia in chronic kidney disease: Secondary | ICD-10-CM | POA: Diagnosis not present

## 2021-07-28 DIAGNOSIS — D509 Iron deficiency anemia, unspecified: Secondary | ICD-10-CM | POA: Diagnosis not present

## 2021-07-28 DIAGNOSIS — D631 Anemia in chronic kidney disease: Secondary | ICD-10-CM | POA: Diagnosis not present

## 2021-07-28 DIAGNOSIS — N2581 Secondary hyperparathyroidism of renal origin: Secondary | ICD-10-CM | POA: Diagnosis not present

## 2021-07-28 DIAGNOSIS — R52 Pain, unspecified: Secondary | ICD-10-CM | POA: Diagnosis not present

## 2021-07-28 DIAGNOSIS — N186 End stage renal disease: Secondary | ICD-10-CM | POA: Diagnosis not present

## 2021-07-28 DIAGNOSIS — Z992 Dependence on renal dialysis: Secondary | ICD-10-CM | POA: Diagnosis not present

## 2021-07-30 DIAGNOSIS — D631 Anemia in chronic kidney disease: Secondary | ICD-10-CM | POA: Diagnosis not present

## 2021-07-30 DIAGNOSIS — R52 Pain, unspecified: Secondary | ICD-10-CM | POA: Diagnosis not present

## 2021-07-30 DIAGNOSIS — N2581 Secondary hyperparathyroidism of renal origin: Secondary | ICD-10-CM | POA: Diagnosis not present

## 2021-07-30 DIAGNOSIS — D509 Iron deficiency anemia, unspecified: Secondary | ICD-10-CM | POA: Diagnosis not present

## 2021-07-30 DIAGNOSIS — N186 End stage renal disease: Secondary | ICD-10-CM | POA: Diagnosis not present

## 2021-07-30 DIAGNOSIS — Z992 Dependence on renal dialysis: Secondary | ICD-10-CM | POA: Diagnosis not present

## 2021-08-01 DIAGNOSIS — R52 Pain, unspecified: Secondary | ICD-10-CM | POA: Diagnosis not present

## 2021-08-01 DIAGNOSIS — N2581 Secondary hyperparathyroidism of renal origin: Secondary | ICD-10-CM | POA: Diagnosis not present

## 2021-08-01 DIAGNOSIS — N186 End stage renal disease: Secondary | ICD-10-CM | POA: Diagnosis not present

## 2021-08-01 DIAGNOSIS — D631 Anemia in chronic kidney disease: Secondary | ICD-10-CM | POA: Diagnosis not present

## 2021-08-01 DIAGNOSIS — Z992 Dependence on renal dialysis: Secondary | ICD-10-CM | POA: Diagnosis not present

## 2021-08-01 DIAGNOSIS — D509 Iron deficiency anemia, unspecified: Secondary | ICD-10-CM | POA: Diagnosis not present

## 2021-08-04 DIAGNOSIS — N186 End stage renal disease: Secondary | ICD-10-CM | POA: Diagnosis not present

## 2021-08-04 DIAGNOSIS — D509 Iron deficiency anemia, unspecified: Secondary | ICD-10-CM | POA: Diagnosis not present

## 2021-08-04 DIAGNOSIS — D631 Anemia in chronic kidney disease: Secondary | ICD-10-CM | POA: Diagnosis not present

## 2021-08-04 DIAGNOSIS — N2581 Secondary hyperparathyroidism of renal origin: Secondary | ICD-10-CM | POA: Diagnosis not present

## 2021-08-04 DIAGNOSIS — Z992 Dependence on renal dialysis: Secondary | ICD-10-CM | POA: Diagnosis not present

## 2021-08-04 DIAGNOSIS — R52 Pain, unspecified: Secondary | ICD-10-CM | POA: Diagnosis not present

## 2021-08-05 DIAGNOSIS — Z992 Dependence on renal dialysis: Secondary | ICD-10-CM | POA: Diagnosis not present

## 2021-08-05 DIAGNOSIS — I129 Hypertensive chronic kidney disease with stage 1 through stage 4 chronic kidney disease, or unspecified chronic kidney disease: Secondary | ICD-10-CM | POA: Diagnosis not present

## 2021-08-05 DIAGNOSIS — N186 End stage renal disease: Secondary | ICD-10-CM | POA: Diagnosis not present

## 2021-08-06 DIAGNOSIS — D509 Iron deficiency anemia, unspecified: Secondary | ICD-10-CM | POA: Diagnosis not present

## 2021-08-06 DIAGNOSIS — N186 End stage renal disease: Secondary | ICD-10-CM | POA: Diagnosis not present

## 2021-08-06 DIAGNOSIS — D631 Anemia in chronic kidney disease: Secondary | ICD-10-CM | POA: Diagnosis not present

## 2021-08-06 DIAGNOSIS — Z992 Dependence on renal dialysis: Secondary | ICD-10-CM | POA: Diagnosis not present

## 2021-08-06 DIAGNOSIS — N2581 Secondary hyperparathyroidism of renal origin: Secondary | ICD-10-CM | POA: Diagnosis not present

## 2021-08-07 DIAGNOSIS — N186 End stage renal disease: Secondary | ICD-10-CM | POA: Diagnosis not present

## 2021-08-07 DIAGNOSIS — I871 Compression of vein: Secondary | ICD-10-CM | POA: Diagnosis not present

## 2021-08-07 DIAGNOSIS — T82858A Stenosis of vascular prosthetic devices, implants and grafts, initial encounter: Secondary | ICD-10-CM | POA: Diagnosis not present

## 2021-08-07 DIAGNOSIS — Z992 Dependence on renal dialysis: Secondary | ICD-10-CM | POA: Diagnosis not present

## 2021-08-08 DIAGNOSIS — N2581 Secondary hyperparathyroidism of renal origin: Secondary | ICD-10-CM | POA: Diagnosis not present

## 2021-08-08 DIAGNOSIS — D631 Anemia in chronic kidney disease: Secondary | ICD-10-CM | POA: Diagnosis not present

## 2021-08-08 DIAGNOSIS — N186 End stage renal disease: Secondary | ICD-10-CM | POA: Diagnosis not present

## 2021-08-08 DIAGNOSIS — D509 Iron deficiency anemia, unspecified: Secondary | ICD-10-CM | POA: Diagnosis not present

## 2021-08-08 DIAGNOSIS — Z992 Dependence on renal dialysis: Secondary | ICD-10-CM | POA: Diagnosis not present

## 2021-08-11 DIAGNOSIS — N2581 Secondary hyperparathyroidism of renal origin: Secondary | ICD-10-CM | POA: Diagnosis not present

## 2021-08-11 DIAGNOSIS — Z992 Dependence on renal dialysis: Secondary | ICD-10-CM | POA: Diagnosis not present

## 2021-08-11 DIAGNOSIS — D631 Anemia in chronic kidney disease: Secondary | ICD-10-CM | POA: Diagnosis not present

## 2021-08-11 DIAGNOSIS — N186 End stage renal disease: Secondary | ICD-10-CM | POA: Diagnosis not present

## 2021-08-11 DIAGNOSIS — D509 Iron deficiency anemia, unspecified: Secondary | ICD-10-CM | POA: Diagnosis not present

## 2021-08-12 ENCOUNTER — Other Ambulatory Visit: Payer: Self-pay | Admitting: Internal Medicine

## 2021-08-12 MED ORDER — BUDESONIDE-FORMOTEROL FUMARATE 80-4.5 MCG/ACT IN AERO: 2.0000 | INHALATION_SPRAY | Freq: Two times a day (BID) | RESPIRATORY_TRACT | 6 refills | Status: DC

## 2021-08-12 MED ORDER — AURYXIA 1 GM 210 MG(FE) PO TABS: 210.0000 mg | ORAL_TABLET | Freq: Three times a day (TID) | ORAL | 1 refills | Status: DC

## 2021-08-13 DIAGNOSIS — Z992 Dependence on renal dialysis: Secondary | ICD-10-CM | POA: Diagnosis not present

## 2021-08-13 DIAGNOSIS — N2581 Secondary hyperparathyroidism of renal origin: Secondary | ICD-10-CM | POA: Diagnosis not present

## 2021-08-13 DIAGNOSIS — N186 End stage renal disease: Secondary | ICD-10-CM | POA: Diagnosis not present

## 2021-08-13 DIAGNOSIS — D631 Anemia in chronic kidney disease: Secondary | ICD-10-CM | POA: Diagnosis not present

## 2021-08-13 DIAGNOSIS — D509 Iron deficiency anemia, unspecified: Secondary | ICD-10-CM | POA: Diagnosis not present

## 2021-08-14 NOTE — Progress Notes (Signed)
PCP: [name], MD Cardiology: Dr. Radford Pax HF cardiology: Dr. Aundra Dubin  HPI:  60 y.o. with history of CAD, ischemic CMP, and ESRD was referred by Dr. Radford Pax for evaluation of CHF.  He had PCI in 5732, uncertain what vessel was involved.  He has not had coronary angiography since that time due to advanced CKD.  Most recent echo in 01/2021 showed EF 25-30% with moderate LV dilation and normal RV.  He had VT arrest in 2015, has St Jude subcutaneous ICD.  [name] in 01/2021.  LHC/RHC was done in 07/2021, showing no significant CAD and stable hemodynamics.    He presented to HF Clinic for followup of CHF on 07/20/21.  He was in the ER on 07/13/21 with an episode of angioedema, has had in the past as well.  Generally was feeling good, no significant dyspnea with usual activities.  No orthopnea/PND.  No lightheadedness.  He has been tolerating dialysis.   Today he returns to HF clinic for pharmacist medication titration. At last visit with MD hydralazine was increased to 75 mg TID and Imdur was increased to 60 mg daily. Overall he is feeling well today. Has been trying to lose weight by eating better and exercising. Has been walking around the track [facility] the [facility], 5-6 laps. Notes occasional dizziness. BP at home is 110/80s. BP was 118/66 in clinic, has not taken meds this morning. Today was a dialysis day, BP decreased to <100/60 in dialysis per patient. Dialysis had to be stopped to get his BP up before continuing. No CP or palpitations. No SOB/DOE. Weight stable. No PND/orthopnea.      HF Medications: Carvedilol 25 mg BID Hydralazine 75 mg TID Imdur 60 mg daily  Has the patient been experiencing any side effects to the medications prescribed?  no  Does the patient have any problems obtaining medications due to transportation or finances?   No [facility] Medicare/Medicaid  Understanding of regimen: good Understanding of indications: good Potential of compliance: good Patient understands to  avoid NSAIDs. Patient understands to avoid decongestants.    Pertinent Lab Values: 07/13/21: Serum creatinine 14.84, BUN 49, Potassium 3.9, Sodium 139  Vital Signs: Weight: 264.8 lbs (last clinic weight: 266.4 lbs) Blood pressure: 118/66 - hasn't taken medications yet  Heart rate: 86   Assessment/Plan: 1. CAD: PCI in 2005, unsure what vessel.  Coronary angiography in 07/2021 showed no significant CAD.    - Continue atorvastatin, lipids ok in 05/2021.  - Continue ASA 81 daily.  2. ESRD: Since 01/2021.  Turned down for renal transplant due to low EF.  3. Chronic systolic CHF: Ischemic cardiomyopathy, echo in 01/2021 with EF 25-30%, moderate LV dilation, mild LVH, normal RV size and systolic function, mild MR.   He has a Research officer, political party subcutaneous ICD.  He is not volume overloaded on exam.  Medication titration limited by ESRD, need to avoid hypotension at HD.  - No medication changes today given low BP in dialysis this morning. BP in clinic today 118/66, has not taken medications yet today.  - Continue carvedilol 25 mg BID.  - Continue hydralazine 75 mg TID and Imdur 60 mg daily.  He knows to avoid taking sildenafil if he has used Imdur (needs to be off Imdur for > 24 hrs if he uses).  - ECG with LBBB 144 msec, thought not to be good candidate for CRT upgrade (QRS < 150 msec).  - Repeat echo at followup in 3 months.  4. OSA:  Continue CPAP.  5. Angioedema: Recurrent episodes.  He is not on a med that commonly causes angioedema.  Only Venifer appears to carry any risk.  He has an appointment with an allergist to assess.   Follow up 2 months with Dr. Lita Mains, PharmD, BCPS, [facility], [facility] Pharmacist [phone]

## 2021-08-15 DIAGNOSIS — N2581 Secondary hyperparathyroidism of renal origin: Secondary | ICD-10-CM | POA: Diagnosis not present

## 2021-08-15 DIAGNOSIS — D509 Iron deficiency anemia, unspecified: Secondary | ICD-10-CM | POA: Diagnosis not present

## 2021-08-15 DIAGNOSIS — Z992 Dependence on renal dialysis: Secondary | ICD-10-CM | POA: Diagnosis not present

## 2021-08-15 DIAGNOSIS — N186 End stage renal disease: Secondary | ICD-10-CM | POA: Diagnosis not present

## 2021-08-15 DIAGNOSIS — D631 Anemia in chronic kidney disease: Secondary | ICD-10-CM | POA: Diagnosis not present

## 2021-08-18 ENCOUNTER — Telehealth: Payer: Self-pay

## 2021-08-18 DIAGNOSIS — N2581 Secondary hyperparathyroidism of renal origin: Secondary | ICD-10-CM | POA: Diagnosis not present

## 2021-08-18 DIAGNOSIS — D631 Anemia in chronic kidney disease: Secondary | ICD-10-CM | POA: Diagnosis not present

## 2021-08-18 DIAGNOSIS — D509 Iron deficiency anemia, unspecified: Secondary | ICD-10-CM | POA: Diagnosis not present

## 2021-08-18 DIAGNOSIS — N186 End stage renal disease: Secondary | ICD-10-CM | POA: Diagnosis not present

## 2021-08-18 DIAGNOSIS — Z992 Dependence on renal dialysis: Secondary | ICD-10-CM | POA: Diagnosis not present

## 2021-08-18 NOTE — Telephone Encounter (Signed)
PA for Lorin Picket has been approved until 12/05/21.

## 2021-08-20 ENCOUNTER — Ambulatory Visit (HOSPITAL_COMMUNITY)
Admission: RE | Admit: 2021-08-20 | Discharge: 2021-08-20 | Disposition: A | Discharge status: Home / Self Care | Payer: Medicare Other | Source: Ambulatory Visit | Attending: Cardiology | Admitting: Cardiology

## 2021-08-20 ENCOUNTER — Other Ambulatory Visit: Payer: Self-pay

## 2021-08-20 VITALS — BP 118/66 | HR 86 | Wt 264.8 lb

## 2021-08-20 DIAGNOSIS — N186 End stage renal disease: Secondary | ICD-10-CM | POA: Diagnosis not present

## 2021-08-20 DIAGNOSIS — Z992 Dependence on renal dialysis: Secondary | ICD-10-CM | POA: Diagnosis not present

## 2021-08-20 DIAGNOSIS — I2510 Atherosclerotic heart disease of native coronary artery without angina pectoris: Secondary | ICD-10-CM | POA: Insufficient documentation

## 2021-08-20 DIAGNOSIS — I255 Ischemic cardiomyopathy: Secondary | ICD-10-CM | POA: Insufficient documentation

## 2021-08-20 DIAGNOSIS — T783XXA Angioneurotic edema, initial encounter: Secondary | ICD-10-CM | POA: Insufficient documentation

## 2021-08-20 DIAGNOSIS — Z7982 Long term (current) use of aspirin: Secondary | ICD-10-CM | POA: Diagnosis not present

## 2021-08-20 DIAGNOSIS — I5022 Chronic systolic (congestive) heart failure: Secondary | ICD-10-CM | POA: Diagnosis not present

## 2021-08-20 DIAGNOSIS — G4733 Obstructive sleep apnea (adult) (pediatric): Secondary | ICD-10-CM | POA: Insufficient documentation

## 2021-08-20 DIAGNOSIS — Z79899 Other long term (current) drug therapy: Secondary | ICD-10-CM | POA: Diagnosis not present

## 2021-08-20 DIAGNOSIS — D631 Anemia in chronic kidney disease: Secondary | ICD-10-CM | POA: Diagnosis not present

## 2021-08-20 DIAGNOSIS — N2581 Secondary hyperparathyroidism of renal origin: Secondary | ICD-10-CM | POA: Diagnosis not present

## 2021-08-20 DIAGNOSIS — D509 Iron deficiency anemia, unspecified: Secondary | ICD-10-CM | POA: Diagnosis not present

## 2021-08-20 MED FILL — Ethyl Chloride Aerosol Spray: CUTANEOUS | 30 days supply | Qty: 116 | Fill #7 | Status: AC

## 2021-08-20 NOTE — Patient Instructions (Addendum)
It was a pleasure seeing you today! ? ?MEDICATIONS: ?-No medication changes today ?-Call if you have questions about your medications. ? ? ?NEXT APPOINTMENT: ?Return to clinic in 2 months with Dr. McLean. ? ?In general, to take care of your heart failure: ?-Limit your fluid intake to 2 Liters (half-gallon) per day.   ?-Limit your salt intake to ideally 2-3 grams (2000-3000 mg) per day. ?-Weigh yourself daily and record, and bring that "weight diary" to your next appointment.  (Weight gain of 2-3 pounds in 1 day typically means fluid weight.) ?-The medications for your heart are to help your heart and help you live longer.   ?-Please contact us before stopping any of your heart medications. ? ?Call the clinic at [phone] with questions or to reschedule future appointments.  ?

## 2021-08-21 ENCOUNTER — Other Ambulatory Visit: Payer: Self-pay

## 2021-08-22 DIAGNOSIS — D631 Anemia in chronic kidney disease: Secondary | ICD-10-CM | POA: Diagnosis not present

## 2021-08-22 DIAGNOSIS — D509 Iron deficiency anemia, unspecified: Secondary | ICD-10-CM | POA: Diagnosis not present

## 2021-08-22 DIAGNOSIS — Z992 Dependence on renal dialysis: Secondary | ICD-10-CM | POA: Diagnosis not present

## 2021-08-22 DIAGNOSIS — N2581 Secondary hyperparathyroidism of renal origin: Secondary | ICD-10-CM | POA: Diagnosis not present

## 2021-08-22 DIAGNOSIS — N186 End stage renal disease: Secondary | ICD-10-CM | POA: Diagnosis not present

## 2021-08-25 ENCOUNTER — Other Ambulatory Visit: Payer: Self-pay

## 2021-08-25 ENCOUNTER — Ambulatory Visit: Payer: Medicare Other | Admitting: Physician Assistant

## 2021-08-25 VITALS — BP 139/87 | HR 84 | Temp 98.2°F | Resp 18 | Ht 67.0 in | Wt 266.0 lb

## 2021-08-25 DIAGNOSIS — Z6841 Body Mass Index (BMI) 40.0 and over, adult: Secondary | ICD-10-CM

## 2021-08-25 DIAGNOSIS — Z202 Contact with and (suspected) exposure to infections with a predominantly sexual mode of transmission: Secondary | ICD-10-CM

## 2021-08-25 DIAGNOSIS — D631 Anemia in chronic kidney disease: Secondary | ICD-10-CM | POA: Diagnosis not present

## 2021-08-25 DIAGNOSIS — N186 End stage renal disease: Secondary | ICD-10-CM | POA: Diagnosis not present

## 2021-08-25 DIAGNOSIS — Z992 Dependence on renal dialysis: Secondary | ICD-10-CM

## 2021-08-25 DIAGNOSIS — D509 Iron deficiency anemia, unspecified: Secondary | ICD-10-CM | POA: Diagnosis not present

## 2021-08-25 DIAGNOSIS — N2581 Secondary hyperparathyroidism of renal origin: Secondary | ICD-10-CM | POA: Diagnosis not present

## 2021-08-25 LAB — POCT URINALYSIS DIP (CLINITEK)
Bilirubin, UA: NEGATIVE
Glucose, UA: 100 mg/dL — AB
Ketones, POC UA: NEGATIVE mg/dL
Nitrite, UA: NEGATIVE
POC PROTEIN,UA: >=300 — AB
Spec Grav, UA: 1.025 (ref 1.010–1.025)
Urobilinogen, UA: 0.2 E.U./dL
pH, UA: 8 (ref 5.0–8.0)

## 2021-08-25 NOTE — Progress Notes (Signed)
Patient presents for screening of STI from shared exposure from a recent partner. Patient has eaten today and patient has not taken medication today. Patient denies discharge, odor or pain when urinating. Patient last intercourse was 2 weeks ago unprotected with same partner who was diagnosed with trichomoniasis.

## 2021-08-25 NOTE — Progress Notes (Signed)
Established Patient Office Visit  Subjective:  Patient ID: Victor Thompson, male    DOB: Oct 09, 1961  Age: 60 y.o. MRN: [id number]  CC:  Chief Complaint  Patient presents with   Exposure to STD    HPI DAXSON REFFETT reports that his partner was experiencing vaginal discharge and presented to the emergency department approximately a week and a half ago.  Reports that he was sent a screenshot of her after visit summary and stated that she was tested for trichomonas and was also treated for trichomonas.  States that he does not have any confirmation of testing results.  Denies any genital lesions, testicular pain and scrotal pain, abdominal pain, or any type of discharge or dysuria.  States that he goes to dialysis on Tuesdays Thursdays and Saturdays.   Past Medical History:  Diagnosis Date   AICD (automatic cardioverter/defibrillator) present    2015   Asthma    no meds   Cardiac arrest ([place]) 2015   CHF (congestive heart failure) (HCC)    Chronic kidney disease    ckd -stage 5   Coronary artery disease    Hypertension    Myocardial infarction ([place])    Obesity    Pneumonia    Pulmonary edema    Shortness of breath dyspnea    Sleep apnea    USES CPAP   Wears glasses     Past Surgical History:  Procedure Laterality Date   AV FISTULA PLACEMENT Right 03/15/2019   Procedure: RIGHT ARM ARTERIOVENOUS (AV) FISTULA CREATION;  Surgeon: Angelia Mould, MD;  Location: [facility];  Service: Vascular;  Laterality: Right;   COLONOSCOPY     COLONOSCOPY W/ BIOPSIES AND POLYPECTOMY     CORONARY STENT PLACEMENT  2007   IMPLANTABLE CARDIOVERTER DEFIBRILLATOR IMPLANT  2015   RIGHT/LEFT HEART CATH AND CORONARY ANGIOGRAPHY N/A 07/08/2021   Procedure: RIGHT/LEFT HEART CATH AND CORONARY ANGIOGRAPHY;  Surgeon: Larey Dresser, MD;  Location: [facility];  Service: Cardiovascular;  Laterality: N/A;   SUBQ ICD CHANGEOUT N/A 10/13/2020   Procedure: SUBQ ICD CHANGEOUT;  Surgeon: Evans Lance, MD;  Location: [facility];  Service: Cardiovascular;  Laterality: N/A;    Family History  Problem Relation Age of Onset   Hypertension Mother    Liver disease Father     Social History   Socioeconomic History   Marital status: Single    Spouse name: Not on file   Number of children: Not on file   Years of education: Not on file   Highest education level: Not on file  Occupational History   Not on file  Tobacco Use   Smoking status: Never   Smokeless tobacco: Never  Vaping Use   Vaping Use: Never used  Substance and Sexual Activity   Alcohol use: No   Drug use: No   Sexual activity: Yes  Other Topics Concern   Not on file  Social History Narrative   ** Merged History Encounter **       Social Determinants of Health   Financial Resource Strain: Not on file  Food Insecurity: Not on file  Transportation Needs: Not on file  Physical Activity: Not on file  Stress: Not on file  Social Connections: Not on file  Intimate Partner Violence: Not on file    Outpatient Medications Prior to Visit  Medication Sig Dispense Refill   acetaminophen (TYLENOL) 500 MG tablet Take 1,000 mg by mouth every 8 (eight) hours as  needed for mild pain, moderate pain or headache.     albuterol (PROVENTIL) (2.5 MG/3ML) 0.083% nebulizer solution TAKE 3 MLS (2.5 MG TOTAL) BY NEBULIZATION EVERY 6 (SIX) HOURS AS NEEDED FOR WHEEZING OR SHORTNESS OF BREATH. 75 mL 12   albuterol (VENTOLIN HFA) 108 (90 Base) MCG/ACT inhaler Inhale 2 puffs into the lungs every 4 (four) hours as needed for wheezing or shortness of breath. 8 g 6   aspirin EC 81 MG tablet Take 1 tablet (81 mg total) by mouth daily. 30 tablet 5   atorvastatin (LIPITOR) 40 MG tablet TAKE 1 TABLET (40 MG TOTAL) BY MOUTH AT BEDTIME. 30 tablet 2   AURYXIA 1 GM 210 MG(Fe) tablet Take 1 tablet (210 mg total) by mouth 3 (three) times daily. 270 tablet 1   budesonide-formoterol (SYMBICORT) 80-4.5 MCG/ACT inhaler Inhale 2 puffs into  the lungs 2 (two) times daily. 1 each 6   calcitRIOL (ROCALTROL) 0.5 MCG capsule TAKE 1 CAPSULE BY MOUTH ONCE DAILY 30 capsule 11   calcium acetate (PHOSLO) 667 MG capsule Take 2 capsule by mouth three times a day with meals 180 capsule 6   carvedilol (COREG) 25 MG tablet TAKE 1 TABLET (25 MG TOTAL) BY MOUTH 2 (TWO) TIMES DAILY WITH A MEAL. 180 tablet 1   cetirizine (ZYRTEC) 10 MG tablet Take 1 tablet (10 mg total) by mouth daily. 10 tablet 0   Cholecalciferol (VITAMIN D-3) 125 MCG (5000 UT) TABS Take 5,000 Units by mouth daily.     doxercalciferol (HECTOROL) 0.5 MCG capsule Doxercalciferol (Hectorol)     EPINEPHrine 0.3 mg/0.3 mL IJ SOAJ injection Inject 0.3 mg into the muscle once as needed for up to 2 doses (if worsening tongue swelling, SOB, hypoxia, or other concerns for progressive anaphylaxis). 2 each 2   ethyl chloride spray SPRAY 1 SPRAY TO SKIN THREE TIMES A WEEK AS DIRECTED SPRAY A SMALL AMOUNT JUST PRIOR TO NEEDLE INSERTION 116 mL 11   fluticasone (FLONASE) 50 MCG/ACT nasal spray Place 1 spray into both nostrils daily. 16 g 2   hydrALAZINE (APRESOLINE) 25 MG tablet Take 3 tablets (75 mg total) by mouth 3 (three) times daily. 270 tablet 11   iron sucrose in sodium chloride 0.9 % 100 mL Iron Sucrose (Venofer)     isosorbide mononitrate (IMDUR) 60 MG 24 hr tablet Take 1 tablet (60 mg total) by mouth daily. 90 tablet 3   loratadine (CLARITIN) 10 MG tablet Take 10 mg by mouth daily as needed for allergies or rhinitis.     Methoxy PEG-Epoetin Beta (MIRCERA IJ) Mircera     multivitamin (RENA-VIT) TABS tablet TAKE 1 TABLET BY MOUTH ONCE DAILY 30 tablet 11   sildenafil (VIAGRA) 50 MG tablet Take 1 tablet by mouth once a day as needed 30 min prior to sexual activity. 30 tablet 0   predniSONE (DELTASONE) 10 MG tablet Take 5 tablets (50 mg total) by mouth daily. 25 tablet 0   No facility-administered medications prior to visit.    Allergies  Allergen Reactions   Ace Inhibitors Swelling     Swelling of the tongue   Influenza Vaccines Hives and Swelling    SWELLING REACTION UNSPECIFIED    Ketorolac Swelling    SWELLING REACTION UNSPECIFIED    Lidocaine Swelling    TONGUE SWELLS   Lisinopril Swelling    TONGUE SWELLING Pt reported problem with a BP med which sounded like  lisinopril  But as of 09/19/06,pt had tolerated altace without problem   Penicillins  Swelling    TONGUE SWELLS Has patient had a PCN reaction causing immediate rash, facial/tongue/throat swelling, SOB or lightheadedness with hypotension: Yes Has patient had a PCN reaction causing severe rash involving mucus membranes or skin necrosis: No Has patient had a PCN reaction that required hospitalization No Has patient had a PCN reaction occurring within the last 10 years: Yes If all of the above answers are "NO", then may proceed with Cephalosporin use.     ROS Review of Systems  Constitutional:  Negative for chills and fever.  HENT: Negative.    Eyes: Negative.   Respiratory:  Negative for shortness of breath.   Cardiovascular:  Negative for chest pain.  Gastrointestinal: Negative.   Endocrine: Negative.   Genitourinary:  Negative for dysuria, genital sores, hematuria, penile discharge, penile pain, penile swelling, scrotal swelling and testicular pain.  Musculoskeletal: Negative.   Allergic/Immunologic: Negative.   Neurological: Negative.   Hematological: Negative.   Psychiatric/Behavioral: Negative.       Objective:    Physical Exam Vitals and nursing note reviewed.  Constitutional:      Appearance: Normal appearance.  HENT:     Head: Normocephalic and atraumatic.     Right Ear: External ear normal.     Left Ear: External ear normal.     Nose: Nose normal.     Mouth/Throat:     Mouth: Mucous membranes are moist.     Pharynx: Oropharynx is clear.  Eyes:     Extraocular Movements: Extraocular movements intact.     Conjunctiva/sclera: Conjunctivae normal.     Pupils: Pupils are equal,  round, and reactive to light.  Cardiovascular:     Rate and Rhythm: Normal rate and regular rhythm.     Pulses: Normal pulses.     Heart sounds: Normal heart sounds.  Pulmonary:     Effort: Pulmonary effort is normal.     Breath sounds: Normal breath sounds.  Musculoskeletal:        General: Normal range of motion.     Cervical back: Normal range of motion and neck supple.  Skin:    General: Skin is warm and dry.  Neurological:     General: No focal deficit present.     Mental Status: He is alert and oriented to person, place, and time.  Psychiatric:        Mood and Affect: Mood normal.        Behavior: Behavior normal.        Thought Content: Thought content normal.        Judgment: Judgment normal.    BP 139/87 (BP Location: Left Arm, Patient Position: Sitting, Cuff Size: Large)   Pulse 84   Temp 98.2 F (36.8 C) (Oral)   Resp 18   Ht 5\' 7"  (1.702 m)   Wt 266 lb (120.7 kg)   SpO2 100%   BMI 41.66 kg/m  Wt Readings from Last 3 Encounters:  08/25/21 266 lb (120.7 kg)  08/20/21 264 lb 12.8 oz (120.1 kg)  07/20/21 266 lb 6.4 oz (120.8 kg)     Health Maintenance Due  Topic Date Due   Zoster Vaccines- Shingrix (1 of 2) Never done   COVID-19 Vaccine (3 - Pfizer risk series) 04/20/2020   INFLUENZA VACCINE  07/06/2021    There are no preventive care reminders to display for this patient.  Lab Results  Component Value Date   TSH 0.725 03/04/2016   Lab Results  Component Value Date   WBC 4.4 07/13/2021  HGB 13.9 07/13/2021   HCT 41.6 07/13/2021   MCV 97.4 07/13/2021   PLT 250 07/13/2021   Lab Results  Component Value Date   NA 139 07/13/2021   K 3.9 07/13/2021   CO2 27 07/13/2021   GLUCOSE 104 (H) 07/13/2021   BUN 49 (H) 07/13/2021   CREATININE 14.84 (H) 07/13/2021   BILITOT 1.1 11/26/2019   ALKPHOS 74 11/26/2019   AST 17 11/26/2019   ALT 15 11/26/2019   PROT 6.8 11/26/2019   ALBUMIN 3.4 (L) 11/26/2019   CALCIUM 8.8 (L) 07/13/2021   ANIONGAP 19 (H)  07/13/2021   Lab Results  Component Value Date   CHOL 142 05/11/2021   Lab Results  Component Value Date   HDL 36 (L) 05/11/2021   Lab Results  Component Value Date   LDLCALC 75 05/11/2021   Lab Results  Component Value Date   TRIG 154 (H) 05/11/2021   Lab Results  Component Value Date   CHOLHDL 3.9 05/11/2021   Lab Results  Component Value Date   HGBA1C 5.90 12/22/2015      Assessment & Plan:   Problem List Items Addressed This Visit       Genitourinary   ESRD on dialysis ([place])     Other   Class 3 severe obesity due to excess calories with serious comorbidity and body mass index (BMI) of 40.0 to 44.9 in adult [facility])   STD exposure - Primary   Relevant Orders   POCT URINALYSIS DIP (CLINITEK) (Completed)   Urine cytology ancillary only   RPR (Completed)   HIV antibody (with reflex) (Completed)    No orders of the defined types were placed in this encounter. 1. STD exposure Patient declines same day treatment, would like to wait for his test results to return prior to having treatment completed.  Patient education given on safe sex practices Red flags for prompt reevaluation - POCT URINALYSIS DIP (CLINITEK) - Urine cytology ancillary only - RPR - HIV antibody (with reflex)  2. ESRD on dialysis ([place])   3. Class 3 severe obesity due to excess calories with serious comorbidity and body mass index (BMI) of 40.0 to 44.9 in adult [facility])   I have reviewed the patient's medical history (PMH, PSH, Social History, Family History, Medications, and allergies) , and have been updated if relevant. I spent 20 minutes reviewing chart and  face to face time with patient.     Follow-up: Return if symptoms worsen or fail to improve.    Loraine Grip Mayers, PA-C

## 2021-08-25 NOTE — Patient Instructions (Signed)
We will call you with your lab results.  Kennieth Rad, PA-C Physician Assistant [place] [email or web address]   Safe Sex Practicing safe sex means taking steps before and during sex to reduce your risk of: Getting an STI (sexually transmitted infection). Giving your partner an STI. Unwanted or unplanned pregnancy. How to practice safe sex Ways you can practice safe sex  Limit your sexual partners to only one partner who is having sex with only you. Avoid using alcohol and drugs before having sex. Alcohol and drugs can affect your judgment. Before having sex with a new partner: Talk to your partner about past partners, past STIs, and drug use. Get screened for STIs and discuss the results with your partner. Ask your partner to get screened too. Check your body regularly for sores, blisters, rashes, or unusual discharge. If you notice any of these problems, visit your health care provider. Avoid sexual contact if you have symptoms of an infection or you are being treated for an STI. While having sex, use a condom. Make sure to: Use a condom every time you have vaginal, oral, or anal sex. Both females and males should wear condoms during oral sex. Keep condoms in place from the beginning to the end of sexual activity. Use a latex condom, if possible. Latex condoms offer the best protection. Use only water-based lubricants with a condom. Using petroleum-based lubricants or oils will weaken the condom and increase the chance that it will break. Ways your health care provider can help you practice safe sex  See your health care provider for regular screenings, exams, and tests for STIs. Talk with your health care provider about what kind of birth control (contraception) is best for you. Get vaccinated against hepatitis B and human papillomavirus (HPV). If you are at risk of being infected with HIV (human immunodeficiency virus),  talk with your health care provider about taking a prescription medicine to prevent HIV infection. You are at risk for HIV if you: Are a man who has sex with other men. Are sexually active with more than one partner. Take drugs by injection. Have a sex partner who has HIV. Have unprotected sex. Have sex with someone who has sex with both men and women. Have had an STI. Follow these instructions at home: Take over-the-counter and prescription medicines only as told by your health care provider. Keep all follow-up visits. This is important. Where to find more information Centers for Disease Control and Prevention: http://www.wolf.info/ Planned Parenthood: www.plannedparenthood.org Office on [employer] [facility]: [place] Summary Practicing safe sex means taking steps before and during sex to reduce your risk getting an STI, giving your partner an STI, and having an unwanted or unplanned pregnancy. Before having sex with a new partner, talk to your partner about past partners, past STIs, and drug use. Use a condom every time you have vaginal, oral, or anal sex. Both females and males should wear condoms during oral sex. Check your body regularly for sores, blisters, rashes, or unusual discharge. If you notice any of these problems, visit your health care provider. See your health care provider for regular screenings, exams, and tests for STIs. This information is not intended to replace advice given to you by your health care provider. Make sure you discuss any questions you have with your health care provider. Document Revised: 04/28/2020 Document Reviewed: 04/28/2020 Elsevier Patient Education  [place].

## 2021-08-26 ENCOUNTER — Telehealth: Payer: Self-pay | Admitting: Internal Medicine

## 2021-08-26 LAB — HIV ANTIBODY (ROUTINE TESTING W REFLEX): HIV Screen 4th Generation wRfx: NONREACTIVE

## 2021-08-26 LAB — RPR: RPR Ser Ql: NONREACTIVE

## 2021-08-26 NOTE — Telephone Encounter (Signed)
Copied from [place] [phone]. Topic: General - Call Back - No Documentation >> Aug 24, 2021  2:33 PM Erick Blinks wrote: Reason for CRM: Pt called and would like to speak to Opal Sidles, STD related  Best contact: [phone] >> Aug 24, 2021  2:55 PM Pawlus, Brayton Layman A wrote: Pt called back requested to speak with Opal Sidles, please advise.

## 2021-08-26 NOTE — Telephone Encounter (Signed)
Call returned to patient and he said [facility] and had his issue taken care of.

## 2021-08-27 DIAGNOSIS — Z202 Contact with and (suspected) exposure to infections with a predominantly sexual mode of transmission: Secondary | ICD-10-CM | POA: Insufficient documentation

## 2021-08-27 DIAGNOSIS — Z992 Dependence on renal dialysis: Secondary | ICD-10-CM | POA: Diagnosis not present

## 2021-08-27 DIAGNOSIS — N186 End stage renal disease: Secondary | ICD-10-CM | POA: Diagnosis not present

## 2021-08-27 DIAGNOSIS — N2581 Secondary hyperparathyroidism of renal origin: Secondary | ICD-10-CM | POA: Diagnosis not present

## 2021-08-27 DIAGNOSIS — D631 Anemia in chronic kidney disease: Secondary | ICD-10-CM | POA: Diagnosis not present

## 2021-08-27 DIAGNOSIS — D509 Iron deficiency anemia, unspecified: Secondary | ICD-10-CM | POA: Diagnosis not present

## 2021-08-28 DIAGNOSIS — D509 Iron deficiency anemia, unspecified: Secondary | ICD-10-CM | POA: Diagnosis not present

## 2021-08-28 DIAGNOSIS — N186 End stage renal disease: Secondary | ICD-10-CM | POA: Diagnosis not present

## 2021-08-28 DIAGNOSIS — N2581 Secondary hyperparathyroidism of renal origin: Secondary | ICD-10-CM | POA: Diagnosis not present

## 2021-08-28 DIAGNOSIS — Z992 Dependence on renal dialysis: Secondary | ICD-10-CM | POA: Diagnosis not present

## 2021-08-28 DIAGNOSIS — D631 Anemia in chronic kidney disease: Secondary | ICD-10-CM | POA: Diagnosis not present

## 2021-09-01 ENCOUNTER — Other Ambulatory Visit: Payer: Self-pay

## 2021-09-01 DIAGNOSIS — Z992 Dependence on renal dialysis: Secondary | ICD-10-CM | POA: Diagnosis not present

## 2021-09-01 DIAGNOSIS — N2581 Secondary hyperparathyroidism of renal origin: Secondary | ICD-10-CM | POA: Diagnosis not present

## 2021-09-01 DIAGNOSIS — D509 Iron deficiency anemia, unspecified: Secondary | ICD-10-CM | POA: Diagnosis not present

## 2021-09-01 DIAGNOSIS — N186 End stage renal disease: Secondary | ICD-10-CM | POA: Diagnosis not present

## 2021-09-01 DIAGNOSIS — D631 Anemia in chronic kidney disease: Secondary | ICD-10-CM | POA: Diagnosis not present

## 2021-09-02 ENCOUNTER — Other Ambulatory Visit (HOSPITAL_COMMUNITY)
Admission: RE | Admit: 2021-09-02 | Discharge: 2021-09-02 | Disposition: A | Discharge status: Home / Self Care | Payer: Medicare Other | Source: Ambulatory Visit | Attending: Physician Assistant | Admitting: Physician Assistant

## 2021-09-02 ENCOUNTER — Other Ambulatory Visit: Payer: Self-pay

## 2021-09-02 ENCOUNTER — Ambulatory Visit: Payer: Medicare Other | Admitting: *Deleted

## 2021-09-02 DIAGNOSIS — Z202 Contact with and (suspected) exposure to infections with a predominantly sexual mode of transmission: Secondary | ICD-10-CM | POA: Insufficient documentation

## 2021-09-02 DIAGNOSIS — A599 Trichomoniasis, unspecified: Secondary | ICD-10-CM | POA: Diagnosis not present

## 2021-09-02 NOTE — Progress Notes (Signed)
Patient is aware of negative results and provided an additional urine specimen today for further STI testing.

## 2021-09-03 ENCOUNTER — Other Ambulatory Visit: Payer: Self-pay

## 2021-09-03 ENCOUNTER — Encounter: Payer: Self-pay | Admitting: Physician Assistant

## 2021-09-03 ENCOUNTER — Telehealth: Payer: Self-pay | Admitting: *Deleted

## 2021-09-03 DIAGNOSIS — N2581 Secondary hyperparathyroidism of renal origin: Secondary | ICD-10-CM | POA: Diagnosis not present

## 2021-09-03 DIAGNOSIS — Z992 Dependence on renal dialysis: Secondary | ICD-10-CM | POA: Diagnosis not present

## 2021-09-03 DIAGNOSIS — D631 Anemia in chronic kidney disease: Secondary | ICD-10-CM | POA: Diagnosis not present

## 2021-09-03 DIAGNOSIS — N186 End stage renal disease: Secondary | ICD-10-CM | POA: Diagnosis not present

## 2021-09-03 DIAGNOSIS — D509 Iron deficiency anemia, unspecified: Secondary | ICD-10-CM | POA: Diagnosis not present

## 2021-09-03 LAB — URINE CYTOLOGY ANCILLARY ONLY
Bacterial Vaginitis-Urine: NEGATIVE
Candida Urine: NEGATIVE
Chlamydia: NEGATIVE
Comment: NEGATIVE
Comment: NEGATIVE
Comment: NORMAL
Neisseria Gonorrhea: NEGATIVE
Trichomonas: POSITIVE — AB

## 2021-09-03 MED ORDER — METRONIDAZOLE 500 MG PO TABS
2000.0000 mg | ORAL_TABLET | Freq: Once | ORAL | 0 refills | Status: AC
Start: 2021-09-03 — End: 2021-09-05
  Filled 2021-09-03: qty 4, 1d supply, fill #0

## 2021-09-03 NOTE — Telephone Encounter (Signed)
Patient verified DOB Patient is aware of [facility] being noted and treatment being sent to [facility].

## 2021-09-04 ENCOUNTER — Other Ambulatory Visit: Payer: Self-pay

## 2021-09-04 DIAGNOSIS — Z992 Dependence on renal dialysis: Secondary | ICD-10-CM | POA: Diagnosis not present

## 2021-09-04 DIAGNOSIS — N186 End stage renal disease: Secondary | ICD-10-CM | POA: Diagnosis not present

## 2021-09-04 DIAGNOSIS — I129 Hypertensive chronic kidney disease with stage 1 through stage 4 chronic kidney disease, or unspecified chronic kidney disease: Secondary | ICD-10-CM | POA: Diagnosis not present

## 2021-09-05 DIAGNOSIS — Z992 Dependence on renal dialysis: Secondary | ICD-10-CM | POA: Diagnosis not present

## 2021-09-05 DIAGNOSIS — N2581 Secondary hyperparathyroidism of renal origin: Secondary | ICD-10-CM | POA: Diagnosis not present

## 2021-09-05 DIAGNOSIS — D509 Iron deficiency anemia, unspecified: Secondary | ICD-10-CM | POA: Diagnosis not present

## 2021-09-05 DIAGNOSIS — D631 Anemia in chronic kidney disease: Secondary | ICD-10-CM | POA: Diagnosis not present

## 2021-09-05 DIAGNOSIS — N186 End stage renal disease: Secondary | ICD-10-CM | POA: Diagnosis not present

## 2021-09-08 ENCOUNTER — Other Ambulatory Visit: Payer: Self-pay

## 2021-09-08 ENCOUNTER — Encounter: Payer: Self-pay | Admitting: Allergy & Immunology

## 2021-09-08 ENCOUNTER — Ambulatory Visit (INDEPENDENT_AMBULATORY_CARE_PROVIDER_SITE_OTHER): Payer: Medicare Other | Admitting: Allergy & Immunology

## 2021-09-08 VITALS — BP 140/84 | HR 94 | Temp 99.2°F | Resp 18 | Ht 67.0 in | Wt 265.8 lb

## 2021-09-08 DIAGNOSIS — J302 Other seasonal allergic rhinitis: Secondary | ICD-10-CM | POA: Diagnosis not present

## 2021-09-08 DIAGNOSIS — N2581 Secondary hyperparathyroidism of renal origin: Secondary | ICD-10-CM | POA: Diagnosis not present

## 2021-09-08 DIAGNOSIS — J4540 Moderate persistent asthma, uncomplicated: Secondary | ICD-10-CM

## 2021-09-08 DIAGNOSIS — T783XXD Angioneurotic edema, subsequent encounter: Secondary | ICD-10-CM | POA: Diagnosis not present

## 2021-09-08 DIAGNOSIS — D631 Anemia in chronic kidney disease: Secondary | ICD-10-CM | POA: Diagnosis not present

## 2021-09-08 DIAGNOSIS — D509 Iron deficiency anemia, unspecified: Secondary | ICD-10-CM | POA: Diagnosis not present

## 2021-09-08 DIAGNOSIS — Z992 Dependence on renal dialysis: Secondary | ICD-10-CM | POA: Diagnosis not present

## 2021-09-08 DIAGNOSIS — J3089 Other allergic rhinitis: Secondary | ICD-10-CM | POA: Diagnosis not present

## 2021-09-08 DIAGNOSIS — N186 End stage renal disease: Secondary | ICD-10-CM | POA: Diagnosis not present

## 2021-09-08 MED ORDER — FAMOTIDINE 20 MG PO TABS
20.0000 mg | ORAL_TABLET | Freq: Two times a day (BID) | ORAL | 5 refills | Status: DC | PRN
  Filled 2021-09-08: qty 60, 30d supply, fill #0

## 2021-09-08 MED ORDER — CETIRIZINE HCL 10 MG PO TABS
10.0000 mg | ORAL_TABLET | Freq: Two times a day (BID) | ORAL | 5 refills | Status: DC
  Filled 2021-09-08: qty 60, 30d supply, fill #0

## 2021-09-08 NOTE — Progress Notes (Signed)
 NEW PATIENT  Date of Service/Encounter:  09/08/21  Consult requested by: Johnson, Deborah B, MD   Assessment:   Moderate persistent asthma, uncomplicated   Seasonal and perennial allergic rhinitis (grasses, weeds, and indoor molds)  Angioedema - unknown trigger  Plan/Recommendations:   1. Seasonal and perennial allergic rhinitis - Testing today showed: grasses, weeds, and indoor molds - Copy of test results provided.  - Avoidance measures provided. - Stop taking: Claritin - Start taking: Zyrtec (cetirizine) as below - You can use an extra dose of the antihistamine, if needed, for breakthrough symptoms.  - Consider nasal saline rinses 1-2 times daily to remove allergens from the nasal cavities as well as help with mucous clearance (this is especially helpful to do before the nasal sprays are given)  2. Angioedema, subsequent encounter - Your history does not have any "red flags" such as fevers, joint pains, or permanent skin changes that would be concerning for a more serious cause of hives/swelling.  - We will get some labs to rule out serious causes of hives: complete blood count, tryptase level, chronic urticaria panel, CMP, ESR, and CRP. - Chronic hives are often times a self limited process and will "burn themselves out" over 6-12 months, although this is not always the case.  - In the meantime, start suppressive dosing of antihistamines:   - Morning: Zyrtec (cetirizine) 10mg (one tablet) + Pepcid (famotidine) 20mg  - Evening: Zyrtec (cetirizine) 10mg (one tablet) + Pepcid (famotidine) 20mg  - If the above is not working, try adding: Pepcid (famotidine) 20mg - You can change this dosing at home, decreasing the dose as needed or increasing the dosing as needed.   3. Return in about 2 months (around 11/08/2021).     This note in its entirety was forwarded to the Provider who requested this consultation.  Subjective:   Victor Thompson is a 59 y.o. male presenting today  for evaluation of  Chief Complaint  Patient presents with   Angioedema    Swelling not sure the cause - hands, tongues, sometimes feet ( mostly after eating     Victor Thompson has a history of the following: Patient Active Problem List   Diagnosis Date Noted   STD exposure 08/27/2021   ESRD on dialysis (HCC) 02/06/2021   Hypervolemia associated with renal insufficiency 01/12/2021   Angioedema 09/22/2020   Secondary hyperparathyroidism of renal origin (HCC) 07/15/2019   Vitamin D deficiency 07/15/2019   CAD (coronary artery disease), native coronary artery 04/10/2019   Chronic midline low back pain without sciatica 02/17/2018   VT (ventricular tachycardia) 03/29/2016   Defibrillator discharge    Chronic combined systolic and diastolic CHF (congestive heart failure) (HCC) 01/22/2016   Erectile dysfunction 01/22/2016   Allergic rhinitis 01/22/2016   GERD (gastroesophageal reflux disease) 01/22/2016   PROTEINURIA 02/17/2010   Cardiomyopathy, ischemic 02/16/2010   Obstructive sleep apnea 02/16/2010   EXTERNAL HEMORRHOIDS 09/26/2009   Tobacco use disorder in remission 06/03/2009   Essential hypertension 10/01/2008   Hyperlipidemia 07/14/2007   Class 3 severe obesity due to excess calories with serious comorbidity and body mass index (BMI) of 40.0 to 44.9 in adult (HCC) 07/14/2007   Asthma 07/14/2007    History obtained from: chart review and patient.  Victor Thompson was referred by Johnson, Deborah B, MD.     [name] is a 59 y.o. male presenting for an evaluation of angioedema .   He has been having swelling when eating processed foods. It started on   his feet and is now involving more of his body including his tongue. The first time that it happened was years ago. He does not remember what he was doing then. He did have to go to the hospital, although it was not a [facility] location. He received an EpiPen, Benadryl, and prednisone. Everything went away completely. This might have  been around 5 years ago.  It has happened less often lately. Initially and at some point it was once per week or so. He has been to the hospital a number of times for this. He has received epinephrine a number of times. His last ED visit was in August 2022. He does not remember a trigger but he had recently eaten. He had no eaten anything out of the ordinary. He did have hives with the swelling one or two times. He does have some deep itching every time, but there is not usually any hives. Symptoms persist until he gets to the hospital.   Of note, he avoids ACE inhibitors due to angioedema with that. He has swelled from the flu shot before as well. He found out about the ACIE inhibitor a few years ago, maybe around 4 years ago. He did have allergy testing done in [place] and this was negative. This was around 7 years ago. He did have a workup at [facility].   He tolerates peanuts, tree nuts, wheat, eggs, milk, seafood. He does not do soy sauce because of the salt content.   He does have a history of CHF but none of this is felt to be related to his heart.   Asthma/Respiratory Symptom History: He has asthma and has had no issues with his asthma.  He does have a rescue inhaler. He uses this around every blue moon. He uses the Symbicort every blue moon as well. It does not seem that he uses that on a regular basis. He has not gotten prednisone for breathing purposes. He denies nighttime coughing.   Allergic Rhinitis Symptom History: He does have intermittent rhinorrhea and sneezing. This is not constant. He does not get antibiotics for infections at all.   Otherwise, there is no history of other atopic diseases, including asthma, food allergies, drug allergies, stinging insect allergies, eczema, or contact dermatitis. There is no significant infectious history. Vaccinations are up to date.    Past Medical History: Patient Active Problem List   Diagnosis Date Noted   STD exposure 08/27/2021   ESRD on  dialysis ([place]) 02/06/2021   Hypervolemia associated with renal insufficiency 01/12/2021   Angioedema 09/22/2020   Secondary hyperparathyroidism of renal origin ([place]) 07/15/2019   Vitamin D deficiency 07/15/2019   CAD (coronary artery disease), native coronary artery 04/10/2019   Chronic midline low back pain without sciatica 02/17/2018   VT (ventricular tachycardia) 03/29/2016   Defibrillator discharge    Chronic combined systolic and diastolic CHF (congestive heart failure) ([place]) 01/22/2016   Erectile dysfunction 01/22/2016   Allergic rhinitis 01/22/2016   GERD (gastroesophageal reflux disease) 01/22/2016   PROTEINURIA 02/17/2010   Cardiomyopathy, ischemic 02/16/2010   Obstructive sleep apnea 02/16/2010   EXTERNAL HEMORRHOIDS 09/26/2009   Tobacco use disorder in remission 06/03/2009   Essential hypertension 10/01/2008   Hyperlipidemia 07/14/2007   Class 3 severe obesity due to excess calories with serious comorbidity and body mass index (BMI) of 40.0 to 44.9 in adult [facility]) 07/14/2007   Asthma 07/14/2007    Medication List:  Allergies as of 09/08/2021       Reactions  Ace Inhibitors Swelling   Swelling of the tongue   Influenza Vaccines Hives, Swelling   SWELLING REACTION UNSPECIFIED    Ketorolac Swelling   SWELLING REACTION UNSPECIFIED    Lidocaine Swelling   TONGUE SWELLS   Lisinopril Swelling   TONGUE SWELLING Pt reported problem with a BP med which sounded like  lisinopril  But as of 09/19/06,pt had tolerated altace without problem   Penicillins Swelling   TONGUE SWELLS Has patient had a PCN reaction causing immediate rash, facial/tongue/throat swelling, SOB or lightheadedness with hypotension: Yes Has patient had a PCN reaction causing severe rash involving mucus membranes or skin necrosis: No Has patient had a PCN reaction that required hospitalization No Has patient had a PCN reaction occurring within the last 10 years: Yes If all of the above answers are "NO",  then may proceed with Cephalosporin use.        Medication List        Accurate as of September 08, 2021 11:59 PM. If you have any questions, ask your nurse or doctor.          acetaminophen 500 MG tablet Commonly known as: TYLENOL Take 1,000 mg by mouth every 8 (eight) hours as needed for mild pain, moderate pain or headache.   albuterol 108 (90 Base) MCG/ACT inhaler Commonly known as: VENTOLIN HFA Inhale 2 puffs into the lungs every 4 (four) hours as needed for wheezing or shortness of breath.   albuterol (2.5 MG/3ML) 0.083% nebulizer solution Commonly known as: PROVENTIL TAKE 3 MLS (2.5 MG TOTAL) BY NEBULIZATION EVERY 6 (SIX) HOURS AS NEEDED FOR WHEEZING OR SHORTNESS OF BREATH.   aspirin EC 81 MG tablet Take 1 tablet (81 mg total) by mouth daily.   atorvastatin 40 MG tablet Commonly known as: LIPITOR TAKE 1 TABLET (40 MG TOTAL) BY MOUTH AT BEDTIME.   Auryxia 1 GM 210 MG(Fe) tablet Generic drug: ferric citrate Take 1 tablet (210 mg total) by mouth 3 (three) times daily.   budesonide-formoterol 80-4.5 MCG/ACT inhaler Commonly known as: SYMBICORT Inhale 2 puffs into the lungs 2 (two) times daily.   calcitRIOL 0.5 MCG capsule Commonly known as: ROCALTROL TAKE 1 CAPSULE BY MOUTH ONCE DAILY   calcium acetate 667 MG capsule Commonly known as: PHOSLO Take 2 capsule by mouth three times a day with meals   carvedilol 25 MG tablet Commonly known as: COREG TAKE 1 TABLET (25 MG TOTAL) BY MOUTH 2 (TWO) TIMES DAILY WITH A MEAL.   cetirizine 10 MG tablet Commonly known as: ZYRTEC Take 1 tablet (10 mg total) by mouth daily. What changed: Another medication with the same name was added. Make sure you understand how and when to take each. Changed by: Valentina Shaggy, MD   cetirizine 10 MG tablet Commonly known as: ZYRTEC Take 1 tablet (10 mg total) by mouth 2 (two) times daily. as directed for swelling What changed: You were already taking a medication with the same  name, and this prescription was added. Make sure you understand how and when to take each. Changed by: Valentina Shaggy, MD   doxercalciferol 0.5 MCG capsule Commonly known as: HECTOROL Doxercalciferol (Hectorol)   EPINEPHrine 0.3 mg/0.3 mL Soaj injection Commonly known as: EPI-PEN Inject 0.3 mg into the muscle once as needed for up to 2 doses (if worsening tongue swelling, SOB, hypoxia, or other concerns for progressive anaphylaxis).   ethyl chloride spray SPRAY 1 SPRAY TO SKIN THREE TIMES A WEEK AS DIRECTED SPRAY A SMALL AMOUNT JUST  PRIOR TO NEEDLE INSERTION   famotidine 20 MG tablet Commonly known as: PEPCID Take 1 tablet (20 mg total) by mouth 2 (two) times daily as needed for heartburn or indigestion. As directed for swelling Started by: Valentina Shaggy, MD   fluticasone 50 MCG/ACT nasal spray Commonly known as: FLONASE Place 1 spray into both nostrils daily.   hydrALAZINE 25 MG tablet Commonly known as: APRESOLINE Take 3 tablets (75 mg total) by mouth 3 (three) times daily.   iron sucrose in sodium chloride 0.9 % 100 mL Iron Sucrose (Venofer)   isosorbide mononitrate 60 MG 24 hr tablet Commonly known as: IMDUR Take 1 tablet (60 mg total) by mouth daily.   loratadine 10 MG tablet Commonly known as: CLARITIN Take 10 mg by mouth daily as needed for allergies or rhinitis.   MIRCERA IJ Mircera   multivitamin Tabs tablet TAKE 1 TABLET BY MOUTH ONCE DAILY   sildenafil 50 MG tablet Commonly known as: Viagra Take 1 tablet by mouth once a day as needed 30 min prior to sexual activity.   Vitamin D-3 125 MCG (5000 UT) Tabs Take 5,000 Units by mouth daily.        Birth History: non-contributory  Developmental History: non-contributory  Past Surgical History: Past Surgical History:  Procedure Laterality Date   AV FISTULA PLACEMENT Right 03/15/2019   Procedure: RIGHT ARM ARTERIOVENOUS (AV) FISTULA CREATION;  Surgeon: Angelia Mould, MD;  Location:  [facility];  Service: Vascular;  Laterality: Right;   COLONOSCOPY     COLONOSCOPY W/ BIOPSIES AND POLYPECTOMY     CORONARY STENT PLACEMENT  2007   IMPLANTABLE CARDIOVERTER DEFIBRILLATOR IMPLANT  2015   RIGHT/LEFT HEART CATH AND CORONARY ANGIOGRAPHY N/A 07/08/2021   Procedure: RIGHT/LEFT HEART CATH AND CORONARY ANGIOGRAPHY;  Surgeon: Larey Dresser, MD;  Location: [facility];  Service: Cardiovascular;  Laterality: N/A;   SUBQ ICD CHANGEOUT N/A 10/13/2020   Procedure: SUBQ ICD CHANGEOUT;  Surgeon: Evans Lance, MD;  Location: [facility];  Service: Cardiovascular;  Laterality: N/A;     Family History: Family History  Problem Relation Age of Onset   Hypertension Mother    Liver disease Father      Social History: Kollen lives at home with his family.  He lives in a house that has vinyl plank flooring in the main living areas and carpeting in the bedroom.  He has electric heating and central cooling.  There is a dog inside of the home.  There are no dust mite covers on the bedding.  There is no tobacco exposure.  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  He is exposed to fumes, chemicals, and dust.  He does not use a HEPA filter.  He does not live near an interstate or industrial area.  He is not a smoker.   Review of Systems  Constitutional: Negative.  Negative for chills, fever, malaise/fatigue and weight loss.  HENT:  Negative for congestion, ear discharge, ear pain and sinus pain.   Eyes:  Negative for pain, discharge and redness.  Respiratory:  Negative for cough, sputum production, shortness of breath and wheezing.   Cardiovascular: Negative.  Negative for chest pain and palpitations.  Gastrointestinal:  Negative for abdominal pain, constipation, diarrhea, heartburn, nausea and vomiting.  Skin:  Positive for itching. Negative for rash.  Neurological:  Negative for dizziness and headaches.  Endo/Heme/Allergies:  Positive for environmental  allergies. Does not bruise/bleed easily.  Objective:   Blood pressure 140/84, pulse 94, temperature 99.2 F (37.3 C), resp. rate 18, height 5' 7" (1.702 m), weight 265 lb 12.8 oz (120.6 kg), SpO2 97 %. Body mass index is 41.63 kg/m.   Physical Exam:   Physical Exam Vitals reviewed.  Constitutional:      Appearance: He is well-developed. He is obese.  HENT:     Head: Normocephalic and atraumatic.     Right Ear: Tympanic membrane, ear canal and external ear normal. No drainage, swelling or tenderness. Tympanic membrane is not injected, scarred, erythematous, retracted or bulging.     Left Ear: Tympanic membrane, ear canal and external ear normal. No drainage, swelling or tenderness. Tympanic membrane is not injected, scarred, erythematous, retracted or bulging.     Nose: No nasal deformity, septal deviation, mucosal edema or rhinorrhea.     Right Turbinates: Enlarged and swollen.     Left Turbinates: Enlarged and swollen.     Right Sinus: No maxillary sinus tenderness or frontal sinus tenderness.     Left Sinus: No maxillary sinus tenderness or frontal sinus tenderness.     Mouth/Throat:     Mouth: Mucous membranes are not pale and not dry.     Pharynx: Uvula midline.  Eyes:     General:        Right eye: No discharge.        Left eye: No discharge.     Conjunctiva/sclera: Conjunctivae normal.     Right eye: Right conjunctiva is not injected. No chemosis.    Left eye: Left conjunctiva is not injected. No chemosis.    Pupils: Pupils are equal, round, and reactive to light.  Cardiovascular:     Rate and Rhythm: Normal rate and regular rhythm.     Heart sounds: Normal heart sounds.  Pulmonary:     Effort: Pulmonary effort is normal. No tachypnea, accessory muscle usage or respiratory distress.     Breath sounds: Normal breath sounds. No wheezing, rhonchi or rales.  Chest:     Chest wall: No tenderness.  Abdominal:     Tenderness: There is no abdominal tenderness. There  is no guarding or rebound.  Lymphadenopathy:     Head:     Right side of head: No submandibular, tonsillar or occipital adenopathy.     Left side of head: No submandibular, tonsillar or occipital adenopathy.     Cervical: No cervical adenopathy.  Skin:    General: Skin is warm.     Capillary Refill: Capillary refill takes less than 2 seconds.     Coloration: Skin is not pale.     Findings: No abrasion, erythema, petechiae or rash. Rash is not papular, urticarial or vesicular.  Neurological:     Mental Status: He is alert.     Diagnostic studies:    Spirometry: results abnormal (FEV1: 2.01/71%, FVC: 2.49/69%, FEV1/FVC: 81%).    Spirometry consistent with possible restrictive disease.   Allergy Studies:     Airborne Adult Perc - 09/08/21 1410     Time Antigen Placed 1410    Allergen Manufacturer Lavella Hammock    Location Back    Number of Test 59    1. Control-Buffer 50% Glycerol Negative    2. Control-Histamine 1 mg/ml 2+    3. Albumin saline Negative    4. Blue Point Negative    5. [place] Negative    6. Johnson Negative    7. Bergman Blue Negative    8. Meadow Fescue Negative  9. Perennial Rye 3+    10. Sweet Vernal Negative    11. Timothy Negative    12. Cocklebur Negative    13. Burweed Marshelder Negative    14. Ragweed, short Negative    15. Ragweed, Giant Negative    16. Plantain,  English Negative    17. Lamb's Quarters Negative    18. Sheep Sorrell Negative    19. Rough Pigweed Negative    20. Marsh Elder, Rough Negative    21. Mugwort, Common 3+    22. Ash mix Negative    23. Birch mix Negative    24. Beech American Negative    25. Box, Elder Negative    26. Cedar, red Negative    27. Cottonwood, [place] Negative    28. Elm mix Negative    29. Hickory Negative    30. Maple mix Negative    31. Oak, [place] mix Negative    32. Pecan Pollen Negative    33. Pine mix Negative    34. Sycamore Eastern Negative    35. Roslyn, Black Pollen Negative    36.  Alternaria alternata Negative    37. Cladosporium Herbarum Negative    38. Aspergillus mix Negative    39. Penicillium mix Negative    40. Bipolaris sorokiniana (Helminthosporium) Negative    41. Drechslera spicifera (Curvularia) Negative    42. Mucor plumbeus Negative    43. Fusarium moniliforme Negative    44. Aureobasidium pullulans (pullulara) Negative    45. Rhizopus oryzae Negative    46. Botrytis cinera Negative    47. Epicoccum nigrum Negative    48. Phoma betae Negative    49. Candida Albicans Negative    50. Trichophyton mentagrophytes 3+    51. Mite, D Farinae  5,000 AU/ml Negative    52. Mite, D Pteronyssinus  5,000 AU/ml Negative    53. Cat Hair 10,000 BAU/ml Negative    54.  Dog Epithelia Negative    55. Mixed Feathers Negative    56. Horse Epithelia Negative    57. Cockroach, German Negative    58. Mouse Negative    59. Tobacco Leaf Negative             Food Perc - 09/08/21 1410       Test Information   Time Antigen Placed 1410    Allergen Manufacturer Lavella Hammock    Location Back    Number of allergen test 10      Food   1. Peanut Negative    2. Soybean food Negative    3. Wheat, whole Negative    4. Sesame Negative    5. Milk, cow Negative    6. Egg White, chicken Negative    7. Casein Negative    8. Shellfish mix Negative    9. Fish mix Negative    10. Cashew Negative             Allergy testing results were read and interpreted by myself, documented by clinical staff.         Salvatore Marvel, MD [facility]

## 2021-09-08 NOTE — Patient Instructions (Addendum)
1. Seasonal and perennial allergic rhinitis - Testing today showed: grasses, weeds, and indoor molds - Copy of test results provided.  - Avoidance measures provided. - Stop taking: Claritin - Start taking: Zyrtec (cetirizine) as below - You can use an extra dose of the antihistamine, if needed, for breakthrough symptoms.  - Consider nasal saline rinses 1-2 times daily to remove allergens from the nasal cavities as well as help with mucous clearance (this is especially helpful to do before the nasal sprays are given)  2. Angioedema, subsequent encounter - Your history does not have any "red flags" such as fevers, joint pains, or permanent skin changes that would be concerning for a more serious cause of hives/swelling.  - We will get some labs to rule out serious causes of hives: complete blood count, tryptase level, chronic urticaria panel, CMP, ESR, and CRP. - Chronic hives are often times a self limited process and will "burn themselves out" over 6-12 months, although this is not always the case.  - In the meantime, start suppressive dosing of antihistamines:   - Morning: Zyrtec (cetirizine) 46m (one tablet) + Pepcid (famotidine) 257m - Evening: Zyrtec (cetirizine) 1052mone tablet) + Pepcid (famotidine) 8m16m If the above is not working, try adding: Pepcid (famotidine) 8mg82mou can change this dosing at home, decreasing the dose as needed or increasing the dosing as needed.   3. Return in about 2 months (around 11/08/2021).    Please inform us of[facility] Emergency Department visits, hospitalizations, or changes in symptoms. Call us beKoreare going to the ED for breathing or allergy symptoms since we might be able to fit you in for a sick visit. Feel free to contact us anKoreaime with any questions, problems, or concerns.  It was a pleasure to meet you today!  Websites that have reliable patient information: 1. American Academy of Asthma, Allergy, and Immunology: www.aaaai.org 2. Food  Allergy Research and Education (FARE): foodallergy.org 3. Mothers of Asthmatics: http://www.asthmacommunitynetwork.org 4. American College of Allergy, Asthma, and Immunology: www.acaai.org   COVID-19 Vaccine Information can be found at: [email or web address] related to vaccine distribution or appointments, please email [email or web address] or call [phone] realize that you might be concerned about having an allergic reaction to the COVID19 vaccines. To help with that concern, WE ARE OFFERING THE COVID19 VACCINES IN OUR OFFICE! Ask the front desk for dates!     "Like" us onKoreaacebook and Instagram for our latest updates!      A healthy democracy works best when ALL vNew York Life Insuranceicipate! Make sure you are registered to vote! If you have moved or changed any of your contact information, you will need to get this updated before voting!  In some cases, you MAY be able to register to vote online: [email or web address]. Control-Buffer 50% Glycerol Negative   2. Control-Histamine 1 mg/ml 2+   3. Albumin saline Negative   4. Bahia[place]wood Landingtive   5. BermuGuatemalative   6. Johnson Negative   7. [place]ong[place] Negative   8. Meadow Fescue Negative   9. Perennial Rye 3+   10. Sweet Vernal Negative   11. Timothy Negative   12. Cocklebur Negative   13. Burweed Marshelder Negative   14. Ragweed, short Negative   15. Ragweed, Giant Negative   16. Plantain,  English Negative   17. Lamb's Quarters Negative   18. Sheep Sorrell Negative   19. Rough Pigweed Negative   20. MarshAndreas Ohmgh  Negative   21. Mugwort, Common 3+   22. Ash mix Negative   23. Birch mix Negative   24. Beech American Negative   25. Box, Elder Negative   26. Cedar, red Negative   27. Cottonwood, [place] Negative   28. Elm mix Negative   29. Hickory Negative   30. Maple mix Negative   31. Oak, [place] mix  Negative   32. Pecan Pollen Negative   33. Pine mix Negative   34. Sycamore Eastern Negative   35. [place], Black Pollen Negative   36. Alternaria alternata Negative   37. Cladosporium Herbarum Negative   38. Aspergillus mix Negative   39. Penicillium mix Negative   40. Bipolaris sorokiniana (Helminthosporium) Negative   41. Drechslera spicifera (Curvularia) Negative   42. Mucor plumbeus Negative   43. Fusarium moniliforme Negative   44. Aureobasidium pullulans (pullulara) Negative   45. Rhizopus oryzae Negative   46. Botrytis cinera Negative   47. Epicoccum nigrum Negative   48. Phoma betae Negative   49. Candida Albicans Negative   50. Trichophyton mentagrophytes 3+   51. Mite, D Farinae  5,000 AU/ml Negative   52. Mite, D Pteronyssinus  5,000 AU/ml Negative   53. Cat Hair 10,000 BAU/ml Negative   54.  Dog Epithelia Negative   55. Mixed Feathers Negative   56. Horse Epithelia Negative   57. Cockroach, German Negative   58. Mouse Negative   59. Tobacco Leaf Negative      1. Peanut Negative   2. Soybean food Negative   3. Wheat, whole Negative   4. Sesame Negative   5. Milk, cow Negative   6. Egg White, chicken Negative   7. Casein Negative   8. Shellfish mix Negative   9. Fish mix Negative   10. Cashew Negative     Reducing Pollen Exposure  The American Academy of Allergy, Asthma and Immunology suggests the following steps to reduce your exposure to pollen during allergy seasons.    Do not hang sheets or clothing out to dry; pollen may collect on these items. Do not mow lawns or spend time around freshly cut grass; mowing stirs up pollen. Keep windows closed at night.  Keep car windows closed while driving. Minimize morning activities outdoors, a time when pollen counts are usually at their highest. Stay indoors as much as possible when pollen counts or humidity is high and on windy days when pollen tends to remain in the air longer. Use air conditioning when  possible.  Many air conditioners have filters that trap the pollen spores. Use a HEPA room air filter to remove pollen form the indoor air you breathe.

## 2021-09-09 ENCOUNTER — Other Ambulatory Visit: Payer: Self-pay

## 2021-09-10 ENCOUNTER — Ambulatory Visit: Payer: Medicare Other | Admitting: Internal Medicine

## 2021-09-10 ENCOUNTER — Encounter: Payer: Self-pay | Admitting: Allergy & Immunology

## 2021-09-10 DIAGNOSIS — D631 Anemia in chronic kidney disease: Secondary | ICD-10-CM | POA: Diagnosis not present

## 2021-09-10 DIAGNOSIS — N186 End stage renal disease: Secondary | ICD-10-CM | POA: Diagnosis not present

## 2021-09-10 DIAGNOSIS — Z992 Dependence on renal dialysis: Secondary | ICD-10-CM | POA: Diagnosis not present

## 2021-09-10 DIAGNOSIS — D509 Iron deficiency anemia, unspecified: Secondary | ICD-10-CM | POA: Diagnosis not present

## 2021-09-10 DIAGNOSIS — N2581 Secondary hyperparathyroidism of renal origin: Secondary | ICD-10-CM | POA: Diagnosis not present

## 2021-09-11 LAB — CMP14+EGFR
ALT: 31 IU/L (ref 0–44)
AST: 23 IU/L (ref 0–40)
Albumin/Globulin Ratio: 1.5 (ref 1.2–2.2)
Albumin: 4.4 g/dL (ref 3.8–4.9)
Alkaline Phosphatase: 98 IU/L (ref 44–121)
BUN/Creatinine Ratio: 3 — ABNORMAL LOW (ref 9–20)
BUN: 25 mg/dL — ABNORMAL HIGH (ref 6–24)
Bilirubin Total: 0.3 mg/dL (ref 0.0–1.2)
CO2: 28 mmol/L (ref 20–29)
Calcium: 8.6 mg/dL — ABNORMAL LOW (ref 8.7–10.2)
Chloride: 94 mmol/L — ABNORMAL LOW (ref 96–106)
Creatinine, Ser: 8.19 mg/dL — ABNORMAL HIGH (ref 0.76–1.27)
Globulin, Total: 2.9 g/dL (ref 1.5–4.5)
Glucose: 97 mg/dL (ref 70–99)
Potassium: 4.4 mmol/L (ref 3.5–5.2)
Sodium: 142 mmol/L (ref 134–144)
Total Protein: 7.3 g/dL (ref 6.0–8.5)
eGFR: 7 mL/min/{1.73_m2} — ABNORMAL LOW (ref >59)

## 2021-09-11 LAB — CBC WITH DIFFERENTIAL
Basophils Absolute: 0 10*3/uL (ref 0.0–0.2)
Basos: 1 %
EOS (ABSOLUTE): 0.3 10*3/uL (ref 0.0–0.4)
Eos: 5 %
Hematocrit: 35.4 % — ABNORMAL LOW (ref 37.5–51.0)
Hemoglobin: 12 g/dL — ABNORMAL LOW (ref 13.0–17.7)
Immature Grans (Abs): 0 10*3/uL (ref 0.0–0.1)
Immature Granulocytes: 0 %
Lymphocytes Absolute: 0.8 10*3/uL (ref 0.7–3.1)
Lymphs: 15 %
MCH: 32.2 pg (ref 26.6–33.0)
MCHC: 33.9 g/dL (ref 31.5–35.7)
MCV: 95 fL (ref 79–97)
Monocytes Absolute: 0.7 10*3/uL (ref 0.1–0.9)
Monocytes: 13 %
Neutrophils Absolute: 3.8 10*3/uL (ref 1.4–7.0)
Neutrophils: 66 %
RBC: 3.73 x10E6/uL — ABNORMAL LOW (ref 4.14–5.80)
RDW: 14.2 % (ref 11.6–15.4)
WBC: 5.7 10*3/uL (ref 3.4–10.8)

## 2021-09-11 LAB — ANTINUCLEAR ANTIBODIES, IFA: ANA Titer 1: NEGATIVE

## 2021-09-11 LAB — TRYPTASE: Tryptase: 11 ug/L (ref 2.2–13.2)

## 2021-09-11 LAB — C-REACTIVE PROTEIN: CRP: 7 mg/L (ref 0–10)

## 2021-09-11 LAB — SEDIMENTATION RATE: Sed Rate: 60 mm/hr — ABNORMAL HIGH (ref 0–30)

## 2021-09-12 DIAGNOSIS — Z992 Dependence on renal dialysis: Secondary | ICD-10-CM | POA: Diagnosis not present

## 2021-09-12 DIAGNOSIS — D509 Iron deficiency anemia, unspecified: Secondary | ICD-10-CM | POA: Diagnosis not present

## 2021-09-12 DIAGNOSIS — N2581 Secondary hyperparathyroidism of renal origin: Secondary | ICD-10-CM | POA: Diagnosis not present

## 2021-09-12 DIAGNOSIS — N186 End stage renal disease: Secondary | ICD-10-CM | POA: Diagnosis not present

## 2021-09-12 DIAGNOSIS — D631 Anemia in chronic kidney disease: Secondary | ICD-10-CM | POA: Diagnosis not present

## 2021-09-14 ENCOUNTER — Ambulatory Visit (INDEPENDENT_AMBULATORY_CARE_PROVIDER_SITE_OTHER): Payer: Medicare Other

## 2021-09-14 DIAGNOSIS — I255 Ischemic cardiomyopathy: Secondary | ICD-10-CM

## 2021-09-15 DIAGNOSIS — N2581 Secondary hyperparathyroidism of renal origin: Secondary | ICD-10-CM | POA: Diagnosis not present

## 2021-09-15 DIAGNOSIS — D509 Iron deficiency anemia, unspecified: Secondary | ICD-10-CM | POA: Diagnosis not present

## 2021-09-15 DIAGNOSIS — N186 End stage renal disease: Secondary | ICD-10-CM | POA: Diagnosis not present

## 2021-09-15 DIAGNOSIS — D631 Anemia in chronic kidney disease: Secondary | ICD-10-CM | POA: Diagnosis not present

## 2021-09-15 DIAGNOSIS — Z992 Dependence on renal dialysis: Secondary | ICD-10-CM | POA: Diagnosis not present

## 2021-09-15 LAB — ALPHA-GAL PANEL
Allergen Lamb IgE: <0.1 kU/L
Beef IgE: <0.1 kU/L
IgE (Immunoglobulin E), Serum: 145 IU/mL (ref 6–495)
O215-IgE Alpha-Gal: 0.22 kU/L — AB
Pork IgE: <0.1 kU/L

## 2021-09-16 ENCOUNTER — Other Ambulatory Visit: Payer: Self-pay

## 2021-09-16 LAB — CUP PACEART REMOTE DEVICE CHECK
Battery Remaining Percentage: 90 %
Date Time Interrogation Session: 20221010212900
Implantable Lead Implant Date: 20150507
Implantable Lead Implant Date: 20150507
Implantable Lead Location: 753862
Implantable Lead Location: 753862
Implantable Lead Model: 3400
Implantable Lead Model: 3400
Implantable Pulse Generator Implant Date: 20211108
Pulse Gen Serial Number: 146303

## 2021-09-17 DIAGNOSIS — D509 Iron deficiency anemia, unspecified: Secondary | ICD-10-CM | POA: Diagnosis not present

## 2021-09-17 DIAGNOSIS — D631 Anemia in chronic kidney disease: Secondary | ICD-10-CM | POA: Diagnosis not present

## 2021-09-17 DIAGNOSIS — N186 End stage renal disease: Secondary | ICD-10-CM | POA: Diagnosis not present

## 2021-09-17 DIAGNOSIS — N2581 Secondary hyperparathyroidism of renal origin: Secondary | ICD-10-CM | POA: Diagnosis not present

## 2021-09-17 DIAGNOSIS — Z992 Dependence on renal dialysis: Secondary | ICD-10-CM | POA: Diagnosis not present

## 2021-09-19 DIAGNOSIS — N186 End stage renal disease: Secondary | ICD-10-CM | POA: Diagnosis not present

## 2021-09-19 DIAGNOSIS — N2581 Secondary hyperparathyroidism of renal origin: Secondary | ICD-10-CM | POA: Diagnosis not present

## 2021-09-19 DIAGNOSIS — D631 Anemia in chronic kidney disease: Secondary | ICD-10-CM | POA: Diagnosis not present

## 2021-09-19 DIAGNOSIS — Z992 Dependence on renal dialysis: Secondary | ICD-10-CM | POA: Diagnosis not present

## 2021-09-19 DIAGNOSIS — D509 Iron deficiency anemia, unspecified: Secondary | ICD-10-CM | POA: Diagnosis not present

## 2021-09-21 ENCOUNTER — Other Ambulatory Visit: Payer: Self-pay

## 2021-09-21 DIAGNOSIS — D509 Iron deficiency anemia, unspecified: Secondary | ICD-10-CM | POA: Diagnosis not present

## 2021-09-21 DIAGNOSIS — N2581 Secondary hyperparathyroidism of renal origin: Secondary | ICD-10-CM | POA: Diagnosis not present

## 2021-09-21 DIAGNOSIS — N186 End stage renal disease: Secondary | ICD-10-CM | POA: Diagnosis not present

## 2021-09-21 DIAGNOSIS — Z992 Dependence on renal dialysis: Secondary | ICD-10-CM | POA: Diagnosis not present

## 2021-09-21 DIAGNOSIS — D631 Anemia in chronic kidney disease: Secondary | ICD-10-CM | POA: Diagnosis not present

## 2021-09-21 MED FILL — Ethyl Chloride Aerosol Spray: CUTANEOUS | 30 days supply | Qty: 116 | Fill #8 | Status: AC

## 2021-09-22 DIAGNOSIS — D509 Iron deficiency anemia, unspecified: Secondary | ICD-10-CM | POA: Diagnosis not present

## 2021-09-22 DIAGNOSIS — N2581 Secondary hyperparathyroidism of renal origin: Secondary | ICD-10-CM | POA: Diagnosis not present

## 2021-09-22 DIAGNOSIS — D631 Anemia in chronic kidney disease: Secondary | ICD-10-CM | POA: Diagnosis not present

## 2021-09-22 DIAGNOSIS — N186 End stage renal disease: Secondary | ICD-10-CM | POA: Diagnosis not present

## 2021-09-22 DIAGNOSIS — Z992 Dependence on renal dialysis: Secondary | ICD-10-CM | POA: Diagnosis not present

## 2021-09-22 NOTE — Progress Notes (Signed)
Remote ICD transmission.   

## 2021-09-25 ENCOUNTER — Telehealth: Payer: Self-pay | Admitting: *Deleted

## 2021-09-25 ENCOUNTER — Other Ambulatory Visit: Payer: Self-pay | Admitting: *Deleted

## 2021-09-25 ENCOUNTER — Encounter: Payer: Self-pay | Admitting: Internal Medicine

## 2021-09-25 DIAGNOSIS — N186 End stage renal disease: Secondary | ICD-10-CM | POA: Diagnosis not present

## 2021-09-25 DIAGNOSIS — I5042 Chronic combined systolic (congestive) and diastolic (congestive) heart failure: Secondary | ICD-10-CM

## 2021-09-25 DIAGNOSIS — I2510 Atherosclerotic heart disease of native coronary artery without angina pectoris: Secondary | ICD-10-CM

## 2021-09-25 DIAGNOSIS — D509 Iron deficiency anemia, unspecified: Secondary | ICD-10-CM | POA: Diagnosis not present

## 2021-09-25 DIAGNOSIS — N2581 Secondary hyperparathyroidism of renal origin: Secondary | ICD-10-CM | POA: Diagnosis not present

## 2021-09-25 DIAGNOSIS — D631 Anemia in chronic kidney disease: Secondary | ICD-10-CM | POA: Diagnosis not present

## 2021-09-25 DIAGNOSIS — Z992 Dependence on renal dialysis: Secondary | ICD-10-CM | POA: Diagnosis not present

## 2021-09-25 DIAGNOSIS — I10 Essential (primary) hypertension: Secondary | ICD-10-CM

## 2021-09-25 NOTE — Telephone Encounter (Signed)
Error

## 2021-09-25 NOTE — Telephone Encounter (Signed)
error 

## 2021-09-29 DIAGNOSIS — D631 Anemia in chronic kidney disease: Secondary | ICD-10-CM | POA: Diagnosis not present

## 2021-09-29 DIAGNOSIS — N2581 Secondary hyperparathyroidism of renal origin: Secondary | ICD-10-CM | POA: Diagnosis not present

## 2021-09-29 DIAGNOSIS — Z992 Dependence on renal dialysis: Secondary | ICD-10-CM | POA: Diagnosis not present

## 2021-09-29 DIAGNOSIS — D509 Iron deficiency anemia, unspecified: Secondary | ICD-10-CM | POA: Diagnosis not present

## 2021-09-29 DIAGNOSIS — N186 End stage renal disease: Secondary | ICD-10-CM | POA: Diagnosis not present

## 2021-10-01 DIAGNOSIS — N186 End stage renal disease: Secondary | ICD-10-CM | POA: Diagnosis not present

## 2021-10-01 DIAGNOSIS — D631 Anemia in chronic kidney disease: Secondary | ICD-10-CM | POA: Diagnosis not present

## 2021-10-01 DIAGNOSIS — Z992 Dependence on renal dialysis: Secondary | ICD-10-CM | POA: Diagnosis not present

## 2021-10-01 DIAGNOSIS — N2581 Secondary hyperparathyroidism of renal origin: Secondary | ICD-10-CM | POA: Diagnosis not present

## 2021-10-01 DIAGNOSIS — D509 Iron deficiency anemia, unspecified: Secondary | ICD-10-CM | POA: Diagnosis not present

## 2021-10-02 ENCOUNTER — Encounter: Payer: Self-pay | Admitting: Internal Medicine

## 2021-10-02 ENCOUNTER — Other Ambulatory Visit: Payer: Self-pay

## 2021-10-02 ENCOUNTER — Ambulatory Visit: Payer: Medicare Other | Attending: Internal Medicine | Admitting: Internal Medicine

## 2021-10-02 VITALS — BP 136/84 | HR 92 | Resp 16 | Wt 261.4 lb

## 2021-10-02 DIAGNOSIS — G4733 Obstructive sleep apnea (adult) (pediatric): Secondary | ICD-10-CM

## 2021-10-02 DIAGNOSIS — N186 End stage renal disease: Secondary | ICD-10-CM | POA: Diagnosis not present

## 2021-10-02 DIAGNOSIS — I10 Essential (primary) hypertension: Secondary | ICD-10-CM | POA: Diagnosis not present

## 2021-10-02 DIAGNOSIS — R238 Other skin changes: Secondary | ICD-10-CM | POA: Diagnosis not present

## 2021-10-02 DIAGNOSIS — Z2821 Immunization not carried out because of patient refusal: Secondary | ICD-10-CM | POA: Diagnosis not present

## 2021-10-02 DIAGNOSIS — I5042 Chronic combined systolic (congestive) and diastolic (congestive) heart failure: Secondary | ICD-10-CM

## 2021-10-02 DIAGNOSIS — Z992 Dependence on renal dialysis: Secondary | ICD-10-CM

## 2021-10-02 NOTE — Progress Notes (Signed)
Patient ID: Victor Thompson, male    DOB: Jun 08, 1961  MRN: [id number]  CC: Hypertension   Subjective: Victor Thompson is a 60 y.o. male who presents for chronic ds management His concerns today include:  Pt with hx of ESRD on hemodialysis (Dr. Joelyn Oms), HTN, CAD (with hx of cardiac arrest), ICD, combined sys/dia CHF (EF 25-30 % 01/2021), HL, OSA, Obese, COPD, chronic lower back pain with multilevel spondylosis.   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 Needs physical.  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  He states they did not give him a form to bring with him today.   Approved for Medicare 02/2021.  He will need to be scheduled for his Medicare wellness visit.,  OSA: Saw Dr. Annamaria Boots since last visit with me.  He thinks his CPAP machine has finally come in.   He has appt 10/06/21 with a supplier in [place] to be shown how to use machine.  Old machine works off and on.  Wakes up in morning not feeling refreshed.  HTN/CHF/CAD: He had cardiac catheterization by Dr. Aundra Dubin in August of this year.  It revealed no significant CAD.  His medications were adjusted to try to help improve his heart function.  Hydralazine and Imdur doses were increased. -reports he has not taken his meds recently.  Last took meds last wk.  Reports BP at home low and drops lower when at HD when he takes meds. Does not have log of home BP readings but reports that they have been good without medications.  Feels he does not needs meds Limits salt No CP/SOB/LE edema  ESRD: Goes to dialysis on Tue/Th/Sat.  Reports the spray that they use on his right upper arm to numb it before attaching him to the dialysis machine via his graft has been causing his skin color to change in the area.  I think he is referring to the Ethyl chloride spray.  Not on transplant list due to low ejection fraction.  HM:  declines flu shot. Declines shingles. Had 3 COVID shot   Patient Active Problem List    Diagnosis Date Noted   STD exposure 08/27/2021   ESRD on dialysis ([place]) 02/06/2021   Hypervolemia associated with renal insufficiency 01/12/2021   Angioedema 09/22/2020   Secondary hyperparathyroidism of renal origin ([place]) 07/15/2019   Vitamin D deficiency 07/15/2019   CAD (coronary artery disease), native coronary artery 04/10/2019   Chronic midline low back pain without sciatica 02/17/2018   VT (ventricular tachycardia) 03/29/2016   Defibrillator discharge    Chronic combined systolic and diastolic CHF (congestive heart failure) ([place]) 01/22/2016   Erectile dysfunction 01/22/2016   Allergic rhinitis 01/22/2016   GERD (gastroesophageal reflux disease) 01/22/2016   PROTEINURIA 02/17/2010   Cardiomyopathy, ischemic 02/16/2010   Obstructive sleep apnea 02/16/2010   EXTERNAL HEMORRHOIDS 09/26/2009   Tobacco use disorder in remission 06/03/2009   Essential hypertension 10/01/2008   Hyperlipidemia 07/14/2007   Class 3 severe obesity due to excess calories with serious comorbidity and body mass index (BMI) of 40.0 to 44.9 in adult [facility]) 07/14/2007   Asthma 07/14/2007     Current Outpatient Medications on File Prior to Visit  Medication Sig Dispense Refill   acetaminophen (TYLENOL) 500 MG tablet Take 1,000 mg by mouth every 8 (eight) hours as needed for mild pain, moderate pain or headache.     albuterol (PROVENTIL) (2.5 MG/3ML) 0.083% nebulizer solution TAKE 3 MLS (2.5  MG TOTAL) BY NEBULIZATION EVERY 6 (SIX) HOURS AS NEEDED FOR WHEEZING OR SHORTNESS OF BREATH. 75 mL 12   albuterol (VENTOLIN HFA) 108 (90 Base) MCG/ACT inhaler Inhale 2 puffs into the lungs every 4 (four) hours as needed for wheezing or shortness of breath. 8 g 6   aspirin EC 81 MG tablet Take 1 tablet (81 mg total) by mouth daily. 30 tablet 5   atorvastatin (LIPITOR) 40 MG tablet TAKE 1 TABLET (40 MG TOTAL) BY MOUTH AT BEDTIME. 30 tablet 2   AURYXIA 1 GM 210 MG(Fe) tablet Take 1 tablet (210 mg total) by mouth 3 (three) times  daily. 270 tablet 1   budesonide-formoterol (SYMBICORT) 80-4.5 MCG/ACT inhaler Inhale 2 puffs into the lungs 2 (two) times daily. 1 each 6   calcitRIOL (ROCALTROL) 0.5 MCG capsule TAKE 1 CAPSULE BY MOUTH ONCE DAILY 30 capsule 11   calcium acetate (PHOSLO) 667 MG capsule Take 2 capsule by mouth three times a day with meals 180 capsule 6   carvedilol (COREG) 25 MG tablet TAKE 1 TABLET (25 MG TOTAL) BY MOUTH 2 (TWO) TIMES DAILY WITH A MEAL. 180 tablet 1   cetirizine (ZYRTEC) 10 MG tablet Take 1 tablet (10 mg total) by mouth daily. 10 tablet 0   cetirizine (ZYRTEC) 10 MG tablet Take 1 tablet (10 mg total) by mouth 2 (two) times daily. as directed for swelling 60 tablet 5   Cholecalciferol (VITAMIN D-3) 125 MCG (5000 UT) TABS Take 5,000 Units by mouth daily.     doxercalciferol (HECTOROL) 0.5 MCG capsule Doxercalciferol (Hectorol)     EPINEPHrine 0.3 mg/0.3 mL IJ SOAJ injection Inject 0.3 mg into the muscle once as needed for up to 2 doses (if worsening tongue swelling, SOB, hypoxia, or other concerns for progressive anaphylaxis). 2 each 2   ethyl chloride spray SPRAY 1 SPRAY TO SKIN THREE TIMES A WEEK AS DIRECTED SPRAY A SMALL AMOUNT JUST PRIOR TO NEEDLE INSERTION 116 mL 11   famotidine (PEPCID) 20 MG tablet Take 1 tablet (20 mg total) by mouth 2 (two) times daily as needed for heartburn or indigestion. As directed for swelling 60 tablet 5   fluticasone (FLONASE) 50 MCG/ACT nasal spray Place 1 spray into both nostrils daily. 16 g 2   hydrALAZINE (APRESOLINE) 25 MG tablet Take 3 tablets (75 mg total) by mouth 3 (three) times daily. 270 tablet 11   iron sucrose in sodium chloride 0.9 % 100 mL Iron Sucrose (Venofer)     isosorbide mononitrate (IMDUR) 60 MG 24 hr tablet Take 1 tablet (60 mg total) by mouth daily. 90 tablet 3   loratadine (CLARITIN) 10 MG tablet Take 10 mg by mouth daily as needed for allergies or rhinitis.     Methoxy PEG-Epoetin Beta (MIRCERA IJ) Mircera     multivitamin (RENA-VIT) TABS  tablet TAKE 1 TABLET BY MOUTH ONCE DAILY 30 tablet 11   sildenafil (VIAGRA) 50 MG tablet Take 1 tablet by mouth once a day as needed 30 min prior to sexual activity. 30 tablet 0   No current facility-administered medications on file prior to visit.    Allergies  Allergen Reactions   Ace Inhibitors Swelling    Swelling of the tongue   Influenza Vaccines Hives and Swelling    SWELLING REACTION UNSPECIFIED    Ketorolac Swelling    SWELLING REACTION UNSPECIFIED    Lidocaine Swelling    TONGUE SWELLS   Lisinopril Swelling    TONGUE SWELLING Pt reported problem with a BP  med which sounded like  lisinopril  But as of 09/19/06,pt had tolerated altace without problem   Penicillins Swelling    TONGUE SWELLS Has patient had a PCN reaction causing immediate rash, facial/tongue/throat swelling, SOB or lightheadedness with hypotension: Yes Has patient had a PCN reaction causing severe rash involving mucus membranes or skin necrosis: No Has patient had a PCN reaction that required hospitalization No Has patient had a PCN reaction occurring within the last 10 years: Yes If all of the above answers are "NO", then may proceed with Cephalosporin use.     Social History   Socioeconomic History   Marital status: Single    Spouse name: Not on file   Number of children: Not on file   Years of education: Not on file   Highest education level: Not on file  Occupational History   Not on file  Tobacco Use   Smoking status: Never   Smokeless tobacco: Never  Vaping Use   Vaping Use: Never used  Substance and Sexual Activity   Alcohol use: No   Drug use: No   Sexual activity: Yes  Other Topics Concern   Not on file  Social History Narrative   ** Merged History Encounter **       Social Determinants of Health   Financial Resource Strain: Not on file  Food Insecurity: Not on file  Transportation Needs: Not on file  Physical Activity: Not on file  Stress: Not on file  Social Connections:  Not on file  Intimate Partner Violence: Not on file    Family History  Problem Relation Age of Onset   Hypertension Mother    Liver disease Father     Past Surgical History:  Procedure Laterality Date   AV FISTULA PLACEMENT Right 03/15/2019   Procedure: RIGHT ARM ARTERIOVENOUS (AV) FISTULA CREATION;  Surgeon: Angelia Mould, MD;  Location: [facility];  Service: Vascular;  Laterality: Right;   COLONOSCOPY     COLONOSCOPY W/ BIOPSIES AND POLYPECTOMY     CORONARY STENT PLACEMENT  2007   IMPLANTABLE CARDIOVERTER DEFIBRILLATOR IMPLANT  2015   RIGHT/LEFT HEART CATH AND CORONARY ANGIOGRAPHY N/A 07/08/2021   Procedure: RIGHT/LEFT HEART CATH AND CORONARY ANGIOGRAPHY;  Surgeon: Larey Dresser, MD;  Location: [facility];  Service: Cardiovascular;  Laterality: N/A;   SUBQ ICD CHANGEOUT N/A 10/13/2020   Procedure: SUBQ ICD CHANGEOUT;  Surgeon: Evans Lance, MD;  Location: [facility];  Service: Cardiovascular;  Laterality: N/A;    ROS: Review of Systems Negative except as stated above  PHYSICAL EXAM: BP 136/84   Pulse 92   Resp 16   Wt 261 lb 6.4 oz (118.6 kg)   SpO2 95%   BMI 40.94 kg/m   Physical Exam   General appearance - alert, well appearing, and in no distress Mental status - normal mood, behavior, speech, dress, motor activity, and thought processes Neck - supple, no significant adenopathy Chest - clear to auscultation, no wheezes, rales or rhonchi, symmetric air entry Heart - normal rate, regular rhythm, normal S1, S2, no murmurs, rubs, clicks or gallops Extremities -no lower extremity edema. Skin: He has hypopigmentation of about 10 cm in length and the right upper arm over the deltoid muscle.  CMP Latest Ref Rng & Units 09/08/2021 07/13/2021 07/08/2021  Glucose 70 - 99 mg/dL 97 104(H) -  BUN 6 - 24 mg/dL 25(H) 49(H) -  Creatinine 0.76 - 1.27 mg/dL 8.19(H) 14.84(H) -  Sodium 134 - 144  mmol/L 142 139 141  Potassium 3.5 - 5.2 mmol/L 4.4 3.9 4.0  Chloride  96 - 106 mmol/L 94(L) 93(L) -  CO2 20 - 29 mmol/L 28 27 -  Calcium 8.7 - 10.2 mg/dL 8.6(L) 8.8(L) -  Total Protein 6.0 - 8.5 g/dL 7.3 - -  Total Bilirubin 0.0 - 1.2 mg/dL 0.3 - -  Alkaline Phos 44 - 121 IU/L 98 - -  AST 0 - 40 IU/L 23 - -  ALT 0 - 44 IU/L 31 - -   Lipid Panel     Component Value Date/Time   CHOL 142 05/11/2021 1122   TRIG 154 (H) 05/11/2021 1122   HDL 36 (L) 05/11/2021 1122   CHOLHDL 3.9 05/11/2021 1122   VLDL 31 05/11/2021 1122   LDLCALC 75 05/11/2021 1122    CBC    Component Value Date/Time   WBC 5.7 09/08/2021 1450   WBC 4.4 07/13/2021 2342   RBC 3.73 (L) 09/08/2021 1450   RBC 4.27 07/13/2021 2342   HGB 12.0 (L) 09/08/2021 1450   HCT 35.4 (L) 09/08/2021 1450   PLT 250 07/13/2021 2342   PLT 179 10/10/2020 0925   MCV 95 09/08/2021 1450   MCH 32.2 09/08/2021 1450   MCH 32.6 07/13/2021 2342   MCHC 33.9 09/08/2021 1450   MCHC 33.4 07/13/2021 2342   RDW 14.2 09/08/2021 1450   LYMPHSABS 0.8 09/08/2021 1450   MONOABS 0.6 07/13/2021 2342   EOSABS 0.3 09/08/2021 1450   BASOSABS 0.0 09/08/2021 1450    ASSESSMENT AND PLAN: 1. Chronic combined systolic and diastolic CHF (congestive heart failure) ([place]) Compensated.  Patient has not been taking carvedilol, hydralazine or isosorbide consistently because of low blood pressures.  I will send a message to Dr. Aundra Dubin informing him of this.  1 or 2 of them may need to be stopped  2. Essential hypertension Blood pressure today not bad considering he has not been taking his medications.  Encouraged him to check blood pressure at home dialysis days to make sure his blood pressure is staying good.  3. ESRD on dialysis [facility]) Advised him to inform the dialysis nurse of the fact that the spray that is being used to numb the site over his dialysis graft is causing skin discoloration  4. OSA (obstructive sleep apnea) Hopefully he will have his new CPAP machine on the first of next month as he does have an appointment with  the supplier.  5. Other skin changes See #3 above.  6. Influenza vaccination declined Advised getting the flu shot but patient declines.  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  Patient verbalized understanding of the plan and was able to repeat key elements of the plan.   No orders of the defined types were placed in this encounter.    Requested Prescriptions    No prescriptions requested or ordered in this encounter    Return in about 6 weeks (around 11/13/2021) for Medicare Wellness.  Karle Plumber, MD, FACP

## 2021-10-02 NOTE — Patient Instructions (Signed)
I recommend that she get your COVID-19 booster shot as soon as possible.  You can get from any outside pharmacy as we do not have it here. You should find out from the driver's company whether this is a Teacher, adult education.  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  You can check with [facility].

## 2021-10-03 DIAGNOSIS — N2581 Secondary hyperparathyroidism of renal origin: Secondary | ICD-10-CM | POA: Diagnosis not present

## 2021-10-03 DIAGNOSIS — Z992 Dependence on renal dialysis: Secondary | ICD-10-CM | POA: Diagnosis not present

## 2021-10-03 DIAGNOSIS — D509 Iron deficiency anemia, unspecified: Secondary | ICD-10-CM | POA: Diagnosis not present

## 2021-10-03 DIAGNOSIS — D631 Anemia in chronic kidney disease: Secondary | ICD-10-CM | POA: Diagnosis not present

## 2021-10-03 DIAGNOSIS — N186 End stage renal disease: Secondary | ICD-10-CM | POA: Diagnosis not present

## 2021-10-05 DIAGNOSIS — Z992 Dependence on renal dialysis: Secondary | ICD-10-CM | POA: Diagnosis not present

## 2021-10-05 DIAGNOSIS — I129 Hypertensive chronic kidney disease with stage 1 through stage 4 chronic kidney disease, or unspecified chronic kidney disease: Secondary | ICD-10-CM | POA: Diagnosis not present

## 2021-10-05 DIAGNOSIS — N186 End stage renal disease: Secondary | ICD-10-CM | POA: Diagnosis not present

## 2021-10-06 DIAGNOSIS — R52 Pain, unspecified: Secondary | ICD-10-CM | POA: Diagnosis not present

## 2021-10-06 DIAGNOSIS — N186 End stage renal disease: Secondary | ICD-10-CM | POA: Diagnosis not present

## 2021-10-06 DIAGNOSIS — Z992 Dependence on renal dialysis: Secondary | ICD-10-CM | POA: Diagnosis not present

## 2021-10-06 DIAGNOSIS — D509 Iron deficiency anemia, unspecified: Secondary | ICD-10-CM | POA: Diagnosis not present

## 2021-10-06 DIAGNOSIS — N2581 Secondary hyperparathyroidism of renal origin: Secondary | ICD-10-CM | POA: Diagnosis not present

## 2021-10-06 DIAGNOSIS — D631 Anemia in chronic kidney disease: Secondary | ICD-10-CM | POA: Diagnosis not present

## 2021-10-08 DIAGNOSIS — D631 Anemia in chronic kidney disease: Secondary | ICD-10-CM | POA: Diagnosis not present

## 2021-10-08 DIAGNOSIS — Z992 Dependence on renal dialysis: Secondary | ICD-10-CM | POA: Diagnosis not present

## 2021-10-08 DIAGNOSIS — N2581 Secondary hyperparathyroidism of renal origin: Secondary | ICD-10-CM | POA: Diagnosis not present

## 2021-10-08 DIAGNOSIS — N186 End stage renal disease: Secondary | ICD-10-CM | POA: Diagnosis not present

## 2021-10-08 DIAGNOSIS — D509 Iron deficiency anemia, unspecified: Secondary | ICD-10-CM | POA: Diagnosis not present

## 2021-10-08 DIAGNOSIS — R52 Pain, unspecified: Secondary | ICD-10-CM | POA: Diagnosis not present

## 2021-10-10 DIAGNOSIS — D631 Anemia in chronic kidney disease: Secondary | ICD-10-CM | POA: Diagnosis not present

## 2021-10-10 DIAGNOSIS — N186 End stage renal disease: Secondary | ICD-10-CM | POA: Diagnosis not present

## 2021-10-10 DIAGNOSIS — D509 Iron deficiency anemia, unspecified: Secondary | ICD-10-CM | POA: Diagnosis not present

## 2021-10-10 DIAGNOSIS — Z992 Dependence on renal dialysis: Secondary | ICD-10-CM | POA: Diagnosis not present

## 2021-10-10 DIAGNOSIS — N2581 Secondary hyperparathyroidism of renal origin: Secondary | ICD-10-CM | POA: Diagnosis not present

## 2021-10-10 DIAGNOSIS — R52 Pain, unspecified: Secondary | ICD-10-CM | POA: Diagnosis not present

## 2021-10-13 DIAGNOSIS — Z992 Dependence on renal dialysis: Secondary | ICD-10-CM | POA: Diagnosis not present

## 2021-10-13 DIAGNOSIS — D509 Iron deficiency anemia, unspecified: Secondary | ICD-10-CM | POA: Diagnosis not present

## 2021-10-13 DIAGNOSIS — D631 Anemia in chronic kidney disease: Secondary | ICD-10-CM | POA: Diagnosis not present

## 2021-10-13 DIAGNOSIS — R52 Pain, unspecified: Secondary | ICD-10-CM | POA: Diagnosis not present

## 2021-10-13 DIAGNOSIS — N186 End stage renal disease: Secondary | ICD-10-CM | POA: Diagnosis not present

## 2021-10-13 DIAGNOSIS — N2581 Secondary hyperparathyroidism of renal origin: Secondary | ICD-10-CM | POA: Diagnosis not present

## 2021-10-15 DIAGNOSIS — Z992 Dependence on renal dialysis: Secondary | ICD-10-CM | POA: Diagnosis not present

## 2021-10-15 DIAGNOSIS — D631 Anemia in chronic kidney disease: Secondary | ICD-10-CM | POA: Diagnosis not present

## 2021-10-15 DIAGNOSIS — D509 Iron deficiency anemia, unspecified: Secondary | ICD-10-CM | POA: Diagnosis not present

## 2021-10-15 DIAGNOSIS — N186 End stage renal disease: Secondary | ICD-10-CM | POA: Diagnosis not present

## 2021-10-15 DIAGNOSIS — N2581 Secondary hyperparathyroidism of renal origin: Secondary | ICD-10-CM | POA: Diagnosis not present

## 2021-10-15 DIAGNOSIS — R52 Pain, unspecified: Secondary | ICD-10-CM | POA: Diagnosis not present

## 2021-10-16 DIAGNOSIS — G4733 Obstructive sleep apnea (adult) (pediatric): Secondary | ICD-10-CM | POA: Diagnosis not present

## 2021-10-17 DIAGNOSIS — N2581 Secondary hyperparathyroidism of renal origin: Secondary | ICD-10-CM | POA: Diagnosis not present

## 2021-10-17 DIAGNOSIS — N186 End stage renal disease: Secondary | ICD-10-CM | POA: Diagnosis not present

## 2021-10-17 DIAGNOSIS — D631 Anemia in chronic kidney disease: Secondary | ICD-10-CM | POA: Diagnosis not present

## 2021-10-17 DIAGNOSIS — R52 Pain, unspecified: Secondary | ICD-10-CM | POA: Diagnosis not present

## 2021-10-17 DIAGNOSIS — Z992 Dependence on renal dialysis: Secondary | ICD-10-CM | POA: Diagnosis not present

## 2021-10-17 DIAGNOSIS — D509 Iron deficiency anemia, unspecified: Secondary | ICD-10-CM | POA: Diagnosis not present

## 2021-10-20 ENCOUNTER — Other Ambulatory Visit: Payer: Self-pay | Admitting: Nephrology

## 2021-10-20 DIAGNOSIS — Z992 Dependence on renal dialysis: Secondary | ICD-10-CM | POA: Diagnosis not present

## 2021-10-20 DIAGNOSIS — D631 Anemia in chronic kidney disease: Secondary | ICD-10-CM | POA: Diagnosis not present

## 2021-10-20 DIAGNOSIS — G4733 Obstructive sleep apnea (adult) (pediatric): Secondary | ICD-10-CM | POA: Diagnosis not present

## 2021-10-20 DIAGNOSIS — D509 Iron deficiency anemia, unspecified: Secondary | ICD-10-CM | POA: Diagnosis not present

## 2021-10-20 DIAGNOSIS — N186 End stage renal disease: Secondary | ICD-10-CM | POA: Diagnosis not present

## 2021-10-20 DIAGNOSIS — N2581 Secondary hyperparathyroidism of renal origin: Secondary | ICD-10-CM | POA: Diagnosis not present

## 2021-10-20 DIAGNOSIS — R52 Pain, unspecified: Secondary | ICD-10-CM | POA: Diagnosis not present

## 2021-10-21 ENCOUNTER — Ambulatory Visit (HOSPITAL_BASED_OUTPATIENT_CLINIC_OR_DEPARTMENT_OTHER)
Admission: RE | Admit: 2021-10-21 | Discharge: 2021-10-21 | Disposition: A | Discharge status: Home / Self Care | Payer: Medicare Other | Source: Ambulatory Visit | Attending: Cardiology | Admitting: Cardiology

## 2021-10-21 ENCOUNTER — Other Ambulatory Visit: Payer: Self-pay

## 2021-10-21 ENCOUNTER — Encounter (HOSPITAL_COMMUNITY): Payer: Self-pay | Admitting: Cardiology

## 2021-10-21 ENCOUNTER — Ambulatory Visit (HOSPITAL_COMMUNITY)
Admission: RE | Admit: 2021-10-21 | Discharge: 2021-10-21 | Disposition: A | Discharge status: Home / Self Care | Payer: Medicare Other | Source: Ambulatory Visit | Attending: Cardiology | Admitting: Cardiology

## 2021-10-21 VITALS — BP 130/88 | HR 85 | Wt 267.6 lb

## 2021-10-21 DIAGNOSIS — G4733 Obstructive sleep apnea (adult) (pediatric): Secondary | ICD-10-CM | POA: Diagnosis not present

## 2021-10-21 DIAGNOSIS — Z79899 Other long term (current) drug therapy: Secondary | ICD-10-CM | POA: Diagnosis not present

## 2021-10-21 DIAGNOSIS — Z7982 Long term (current) use of aspirin: Secondary | ICD-10-CM | POA: Diagnosis not present

## 2021-10-21 DIAGNOSIS — Z8674 Personal history of sudden cardiac arrest: Secondary | ICD-10-CM | POA: Insufficient documentation

## 2021-10-21 DIAGNOSIS — Z95810 Presence of automatic (implantable) cardiac defibrillator: Secondary | ICD-10-CM | POA: Diagnosis not present

## 2021-10-21 DIAGNOSIS — I5042 Chronic combined systolic (congestive) and diastolic (congestive) heart failure: Secondary | ICD-10-CM

## 2021-10-21 DIAGNOSIS — Z09 Encounter for follow-up examination after completed treatment for conditions other than malignant neoplasm: Secondary | ICD-10-CM | POA: Insufficient documentation

## 2021-10-21 DIAGNOSIS — T783XXA Angioneurotic edema, initial encounter: Secondary | ICD-10-CM | POA: Diagnosis not present

## 2021-10-21 DIAGNOSIS — N186 End stage renal disease: Secondary | ICD-10-CM | POA: Diagnosis not present

## 2021-10-21 DIAGNOSIS — E785 Hyperlipidemia, unspecified: Secondary | ICD-10-CM | POA: Insufficient documentation

## 2021-10-21 DIAGNOSIS — I255 Ischemic cardiomyopathy: Secondary | ICD-10-CM | POA: Diagnosis not present

## 2021-10-21 DIAGNOSIS — I132 Hypertensive heart and chronic kidney disease with heart failure and with stage 5 chronic kidney disease, or end stage renal disease: Secondary | ICD-10-CM | POA: Insufficient documentation

## 2021-10-21 DIAGNOSIS — I2510 Atherosclerotic heart disease of native coronary artery without angina pectoris: Secondary | ICD-10-CM | POA: Insufficient documentation

## 2021-10-21 DIAGNOSIS — Z992 Dependence on renal dialysis: Secondary | ICD-10-CM | POA: Insufficient documentation

## 2021-10-21 DIAGNOSIS — Z8616 Personal history of COVID-19: Secondary | ICD-10-CM | POA: Diagnosis not present

## 2021-10-21 DIAGNOSIS — Z7901 Long term (current) use of anticoagulants: Secondary | ICD-10-CM | POA: Diagnosis not present

## 2021-10-21 LAB — ECHOCARDIOGRAM COMPLETE
Area-P 1/2: 4.55 cm2
Calc EF: 25.7 %
S' Lateral: 5.6 cm
Single Plane A2C EF: 28.9 %
Single Plane A4C EF: 30.7 %

## 2021-10-21 NOTE — Progress Notes (Signed)
PCP: [name], MD Cardiology: Dr. Radford Pax HF cardiology: Dr. Aundra Dubin  60 y.o. with history of CAD, ischemic CMP, and ESRD was referred by Dr. Radford Pax for evaluation of CHF.  He had PCI in 7371, uncertain what vessel was involved.  He has not had coronary angiography since that time due to advanced CKD.  He has had ischemic cardiomyopathy x years.  Echo in 2/22 showed EF 25-30% with moderate LV dilation and normal RV.  He had VT arrest in 2015, has St Jude subcutaneous ICD.  [name] in 2/22.  LHC/RHC was done in 8/22, showing no significant CAD and stable hemodynamics.   Echo was done today and reviewed, EF 25-30%, moderate LV dilation, diffuse hypokinesis, normal RV.   He returns for followup of CHF.  He is tolerating dialysis reasonably well.  He holds his cardiac meds the morning of dialysis and still gets hypotension at times.  No significant exertional dyspnea or chest pain.  He is working full times as a Chief Strategy Officer, does a lot of physical labor with no problems.  No lightheadedness.  Weight stable.    ECG (personally reviewed): NSR, IVCD 138 msec  Labs (6/22): LDL 75 Labs (8/22): hgb 13.9  PMH: 1. CAD: PCI in 0626, uncertain what vessel.  - Cardiolite 2017: Inferior and inferolateral large fixed defect suggestive of prior infarction, EF 28%.  - LHC (8/22): No significant CAD.  2. Chronic systolic CHF: Ischemic cardiomyopathy. St Jude subcutaneous ICD.  - Echo (7/20) with EF 25-30%.  - Echo (10/20) with EF 40-45% - Echo (2/22): EF 25-30%, moderate LV dilation, mild LVH, normal RV size and systolic function, mild MR.  - LHC/RHC (8/22): mean RA 2, PA 24/3, mean PCWP 9, CI 3.13; no significant coronary disease.  - Echo (11/22): EF 25-30%, moderate LV dilation, diffuse hypokinesis, normal RV. 3. ESRD since 2/22 4. OSA on CPAP 5. H/o VT arrest: Subcutaneous St Jude ICD placed in 2015 at [facility].  6. COVID-19 1/22.  7. HTN 8. Hyperlipidemia 9. Recurrent angioedema.    Social History   Socioeconomic History   Marital status: Single    Spouse name: Not on file   Number of children: Not on file   Years of education: Not on file   Highest education level: Not on file  Occupational History   Not on file  Tobacco Use   Smoking status: Never   Smokeless tobacco: Never  Vaping Use   Vaping Use: Never used  Substance and Sexual Activity   Alcohol use: No   Drug use: No   Sexual activity: Yes  Other Topics Concern   Not on file  Social History Narrative   ** Merged History Encounter **       Social Determinants of Health   Financial Resource Strain: Not on file  Food Insecurity: Not on file  Transportation Needs: Not on file  Physical Activity: Not on file  Stress: Not on file  Social Connections: Not on file  Intimate Partner Violence: Not on file   Family History  Problem Relation Age of Onset   Hypertension Mother    Liver disease Father    ROS: All systems reviewed and negative except as per HPI.   Current Outpatient Medications  Medication Sig Dispense Refill   acetaminophen (TYLENOL) 500 MG tablet Take 1,000 mg by mouth every 8 (eight) hours as needed for mild pain, moderate pain or headache.     albuterol (PROVENTIL) (2.5 MG/3ML) 0.083% nebulizer solution TAKE  3 MLS (2.5 MG TOTAL) BY NEBULIZATION EVERY 6 (SIX) HOURS AS NEEDED FOR WHEEZING OR SHORTNESS OF BREATH. 75 mL 12   albuterol (VENTOLIN HFA) 108 (90 Base) MCG/ACT inhaler Inhale 2 puffs into the lungs every 4 (four) hours as needed for wheezing or shortness of breath. 8 g 6   aspirin EC 81 MG tablet Take 1 tablet (81 mg total) by mouth daily. 30 tablet 5   atorvastatin (LIPITOR) 40 MG tablet TAKE 1 TABLET (40 MG TOTAL) BY MOUTH AT BEDTIME. 30 tablet 2   AURYXIA 1 GM 210 MG(Fe) tablet Take 1 tablet (210 mg total) by mouth 3 (three) times daily. 270 tablet 1   budesonide-formoterol (SYMBICORT) 80-4.5 MCG/ACT inhaler Inhale 2 puffs into the lungs 2 (two) times daily. 1 each 6    calcium acetate (PHOSLO) 667 MG capsule Take 2 capsule by mouth three times a day with meals 180 capsule 6   carvedilol (COREG) 25 MG tablet TAKE 1 TABLET (25 MG TOTAL) BY MOUTH 2 (TWO) TIMES DAILY WITH A MEAL. 180 tablet 1   cetirizine (ZYRTEC) 10 MG tablet Take 1 tablet (10 mg total) by mouth daily. 10 tablet 0   Cholecalciferol (VITAMIN D-3) 125 MCG (5000 UT) TABS Take 5,000 Units by mouth daily.     doxercalciferol (HECTOROL) 0.5 MCG capsule Doxercalciferol (Hectorol)     EPINEPHrine 0.3 mg/0.3 mL IJ SOAJ injection Inject 0.3 mg into the muscle once as needed for up to 2 doses (if worsening tongue swelling, SOB, hypoxia, or other concerns for progressive anaphylaxis). 2 each 2   ethyl chloride spray SPRAY 1 SPRAY TO SKIN THREE TIMES A WEEK AS DIRECTED SPRAY A SMALL AMOUNT JUST PRIOR TO NEEDLE INSERTION 116 mL 11   fluticasone (FLONASE) 50 MCG/ACT nasal spray Place into both nostrils as needed for allergies or rhinitis.     hydrALAZINE (APRESOLINE) 25 MG tablet Take 3 tablets (75 mg total) by mouth 3 (three) times daily. 270 tablet 11   isosorbide mononitrate (IMDUR) 60 MG 24 hr tablet Take 1 tablet (60 mg total) by mouth daily. 90 tablet 3   loratadine (CLARITIN) 10 MG tablet Take 10 mg by mouth daily as needed for allergies or rhinitis.     Methoxy PEG-Epoetin Beta (MIRCERA IJ) Mircera     multivitamin (RENA-VIT) TABS tablet TAKE 1 TABLET BY MOUTH ONCE DAILY 30 tablet 11   sildenafil (VIAGRA) 50 MG tablet Take 1 tablet by mouth once a day as needed 30 min prior to sexual activity. 30 tablet 0   No current facility-administered medications for this encounter.   BP 130/88   Pulse 85   Wt 121.4 kg (267 lb 9.6 oz)   SpO2 95%   BMI 41.91 kg/m  General: NAD, obese.  Neck: No JVD, no thyromegaly or thyroid nodule.  Lungs: Clear to auscultation bilaterally with normal respiratory effort. CV: Nondisplaced PMI.  Heart regular S1/S2, no S3/S4, no murmur.  No peripheral edema.  No carotid  bruit.  Normal pedal pulses.  Abdomen: Soft, nontender, no hepatosplenomegaly, no distention.  Skin: Intact without lesions or rashes.  Neurologic: Alert and oriented x 3.  Psych: Normal affect. Extremities: No clubbing or cyanosis.  HEENT: Normal.    Assessment/Plan: 1. CAD: PCI in 2005, unsure what vessel.  Coronary angiography in 8/22 showed no significant CAD.    - Continue atorvastatin, lipids ok in 6/22.  - Continue ASA 81 daily.  2. ESRD: Since 2/22.  Turned down for renal transplant due to  low EF.  3. Chronic systolic CHF: Ischemic cardiomyopathy, echo in 2/22 with EF 25-30%, moderate LV dilation, mild LVH, normal RV size and systolic function, mild MR.   He has a Research officer, political party subcutaneous ICD.  Echo today with EF 25-30%, moderate LV dilation, normal RV.  He is not volume overloaded on exam.  Medication titration limited by ESRD, need to avoid hypotension at HD.  - Continue Coreg 25 mg bid.  - Continue hydralazine 75 mg tid and Imdur to 60 mg daily.  He knows to avoid taking sildenafil if he has used Imdur (needs to be off Imdur for > 24 hrs if he uses).  - ECG with IVCD 138 msec, thought not to be good candidate for CRT upgrade (QRS < 150 msec, not true LBBB).  4. OSA: Continue CPAP.  5. Angioedema: Recurrent episodes.  He is not on a med that commonly causes angioedema.    Followup in 4 months with APP.   Loralie Champagne 10/21/2021

## 2021-10-21 NOTE — Patient Instructions (Signed)
Labs done today, your results will be available in MyChart, we will contact you for abnormal readings. ? ?Your physician recommends that you schedule a follow-up appointment in: 4 months ? ?If you have any questions or concerns before your next appointment please send us a message through mychart or call our office at [phone].   ? ?TO LEAVE A MESSAGE FOR THE NURSE SELECT OPTION 2, PLEASE LEAVE A MESSAGE INCLUDING: ?YOUR NAME ?DATE OF BIRTH ?CALL BACK NUMBER ?REASON FOR CALL**this is important as we prioritize the call backs ? ?YOU WILL RECEIVE A CALL BACK THE SAME DAY AS LONG AS YOU CALL BEFORE 4:00 PM ? ?At the [facility], you and your health needs are our priority. As part of our continuing mission to provide you with exceptional heart care, we have created designated Provider Care Teams. These Care Teams include your primary Cardiologist (physician) and Advanced Practice Providers (APPs- Physician Assistants and Nurse Practitioners) who all work together to provide you with the care you need, when you need it.  ? ?You may see any of the following providers on your designated Care Team at your next follow up: ?Dr Daniel Bensimhon ?Dr Dalton McLean ?Amy Clegg, NP ?Brittainy Simmons, PA ?Jessica Milford,NP ?Lindsay Finch, PA ?Lauren Kemp, PharmD ? ? ?Please be sure to bring in all your medications bottles to every appointment.  ? ? ?

## 2021-10-21 NOTE — Progress Notes (Signed)
  Echocardiogram 2D Echocardiogram has been performed.  Darlina Sicilian M 10/21/2021, 11:38 AM

## 2021-10-22 ENCOUNTER — Other Ambulatory Visit: Payer: Self-pay

## 2021-10-22 DIAGNOSIS — D631 Anemia in chronic kidney disease: Secondary | ICD-10-CM | POA: Diagnosis not present

## 2021-10-22 DIAGNOSIS — Z992 Dependence on renal dialysis: Secondary | ICD-10-CM | POA: Diagnosis not present

## 2021-10-22 DIAGNOSIS — D509 Iron deficiency anemia, unspecified: Secondary | ICD-10-CM | POA: Diagnosis not present

## 2021-10-22 DIAGNOSIS — N2581 Secondary hyperparathyroidism of renal origin: Secondary | ICD-10-CM | POA: Diagnosis not present

## 2021-10-22 DIAGNOSIS — N186 End stage renal disease: Secondary | ICD-10-CM | POA: Diagnosis not present

## 2021-10-22 DIAGNOSIS — R52 Pain, unspecified: Secondary | ICD-10-CM | POA: Diagnosis not present

## 2021-10-24 DIAGNOSIS — Z992 Dependence on renal dialysis: Secondary | ICD-10-CM | POA: Diagnosis not present

## 2021-10-24 DIAGNOSIS — D631 Anemia in chronic kidney disease: Secondary | ICD-10-CM | POA: Diagnosis not present

## 2021-10-24 DIAGNOSIS — N186 End stage renal disease: Secondary | ICD-10-CM | POA: Diagnosis not present

## 2021-10-24 DIAGNOSIS — N2581 Secondary hyperparathyroidism of renal origin: Secondary | ICD-10-CM | POA: Diagnosis not present

## 2021-10-24 DIAGNOSIS — R52 Pain, unspecified: Secondary | ICD-10-CM | POA: Diagnosis not present

## 2021-10-24 DIAGNOSIS — D509 Iron deficiency anemia, unspecified: Secondary | ICD-10-CM | POA: Diagnosis not present

## 2021-10-26 ENCOUNTER — Other Ambulatory Visit: Payer: Self-pay

## 2021-10-26 ENCOUNTER — Other Ambulatory Visit: Payer: Self-pay | Admitting: Nephrology

## 2021-10-26 ENCOUNTER — Ambulatory Visit: Payer: Self-pay

## 2021-10-26 ENCOUNTER — Other Ambulatory Visit: Payer: Self-pay | Admitting: Internal Medicine

## 2021-10-26 MED ORDER — ETHYL CHLORIDE EX AERO
INHALATION_SPRAY | CUTANEOUS | 11 refills | Status: DC
  Filled 2021-10-26 (×2): qty 116, 30d supply, fill #0
  Filled 2021-12-01: qty 116, 30d supply, fill #1
  Filled 2022-01-07: qty 116, 30d supply, fill #0
  Filled 2022-01-07: qty 116, 30d supply, fill #2
  Filled 2022-02-11: qty 116, 30d supply, fill #1
  Filled 2022-04-03: qty 116, 30d supply, fill #2
  Filled 2022-05-18: qty 116, 30d supply, fill #3
  Filled 2022-07-01: qty 116, 30d supply, fill #4
  Filled 2022-08-03: qty 116, 30d supply, fill #5
  Filled 2022-10-02 – 2022-10-04 (×2): qty 116, 30d supply, fill #6

## 2021-10-26 NOTE — Telephone Encounter (Signed)
Pt. Reports he is out of refills for ethyl chloride spray he needs for dialysis tomorrow. Dr. Brayton El ordered last. Please advise pt.    Answer Assessment - Initial Assessment Questions 1. DRUG NAME: "What medicine do you need to have refilled?"     Ethyl chloride spray 2. REFILLS REMAINING: "How many refills are remaining?" (Note: The label on the medicine or pill bottle will show how many refills are remaining. If there are no refills remaining, then a renewal may be needed.)     0 3. EXPIRATION DATE: "What is the expiration date?" (Note: The label states when the prescription will expire, and thus can no longer be refilled.)     N/a 4. PRESCRIBING HCP: "Who prescribed it?" Reason: If prescribed by specialist, call should be referred to that group.     Dr. Brayton El ordered last 5. SYMPTOMS: "Do you have any symptoms?"     N/a 6. PREGNANCY: "Is there any chance that you are pregnant?" "When was your last menstrual period?"     N/a  Protocols used: Medication Refill and Renewal Call-A-AH

## 2021-10-26 NOTE — Telephone Encounter (Signed)
Will forward to provider  

## 2021-10-27 DIAGNOSIS — N186 End stage renal disease: Secondary | ICD-10-CM | POA: Diagnosis not present

## 2021-10-27 DIAGNOSIS — Z992 Dependence on renal dialysis: Secondary | ICD-10-CM | POA: Diagnosis not present

## 2021-10-27 DIAGNOSIS — D509 Iron deficiency anemia, unspecified: Secondary | ICD-10-CM | POA: Diagnosis not present

## 2021-10-27 DIAGNOSIS — N2581 Secondary hyperparathyroidism of renal origin: Secondary | ICD-10-CM | POA: Diagnosis not present

## 2021-10-27 DIAGNOSIS — D631 Anemia in chronic kidney disease: Secondary | ICD-10-CM | POA: Diagnosis not present

## 2021-10-27 DIAGNOSIS — R52 Pain, unspecified: Secondary | ICD-10-CM | POA: Diagnosis not present

## 2021-10-30 DIAGNOSIS — D631 Anemia in chronic kidney disease: Secondary | ICD-10-CM | POA: Diagnosis not present

## 2021-10-30 DIAGNOSIS — R52 Pain, unspecified: Secondary | ICD-10-CM | POA: Diagnosis not present

## 2021-10-30 DIAGNOSIS — N2581 Secondary hyperparathyroidism of renal origin: Secondary | ICD-10-CM | POA: Diagnosis not present

## 2021-10-30 DIAGNOSIS — D509 Iron deficiency anemia, unspecified: Secondary | ICD-10-CM | POA: Diagnosis not present

## 2021-10-30 DIAGNOSIS — N186 End stage renal disease: Secondary | ICD-10-CM | POA: Diagnosis not present

## 2021-10-30 DIAGNOSIS — Z992 Dependence on renal dialysis: Secondary | ICD-10-CM | POA: Diagnosis not present

## 2021-11-01 DIAGNOSIS — N186 End stage renal disease: Secondary | ICD-10-CM | POA: Diagnosis not present

## 2021-11-01 DIAGNOSIS — Z992 Dependence on renal dialysis: Secondary | ICD-10-CM | POA: Diagnosis not present

## 2021-11-01 DIAGNOSIS — D509 Iron deficiency anemia, unspecified: Secondary | ICD-10-CM | POA: Diagnosis not present

## 2021-11-01 DIAGNOSIS — N2581 Secondary hyperparathyroidism of renal origin: Secondary | ICD-10-CM | POA: Diagnosis not present

## 2021-11-01 DIAGNOSIS — R52 Pain, unspecified: Secondary | ICD-10-CM | POA: Diagnosis not present

## 2021-11-01 DIAGNOSIS — D631 Anemia in chronic kidney disease: Secondary | ICD-10-CM | POA: Diagnosis not present

## 2021-11-03 DIAGNOSIS — D509 Iron deficiency anemia, unspecified: Secondary | ICD-10-CM | POA: Diagnosis not present

## 2021-11-03 DIAGNOSIS — N186 End stage renal disease: Secondary | ICD-10-CM | POA: Diagnosis not present

## 2021-11-03 DIAGNOSIS — D631 Anemia in chronic kidney disease: Secondary | ICD-10-CM | POA: Diagnosis not present

## 2021-11-03 DIAGNOSIS — N2581 Secondary hyperparathyroidism of renal origin: Secondary | ICD-10-CM | POA: Diagnosis not present

## 2021-11-03 DIAGNOSIS — Z992 Dependence on renal dialysis: Secondary | ICD-10-CM | POA: Diagnosis not present

## 2021-11-03 DIAGNOSIS — R52 Pain, unspecified: Secondary | ICD-10-CM | POA: Diagnosis not present

## 2021-11-04 DIAGNOSIS — Z992 Dependence on renal dialysis: Secondary | ICD-10-CM | POA: Diagnosis not present

## 2021-11-04 DIAGNOSIS — I129 Hypertensive chronic kidney disease with stage 1 through stage 4 chronic kidney disease, or unspecified chronic kidney disease: Secondary | ICD-10-CM | POA: Diagnosis not present

## 2021-11-04 DIAGNOSIS — N186 End stage renal disease: Secondary | ICD-10-CM | POA: Diagnosis not present

## 2021-11-05 ENCOUNTER — Ambulatory Visit: Payer: Medicare Other | Admitting: Internal Medicine

## 2021-11-05 DIAGNOSIS — N186 End stage renal disease: Secondary | ICD-10-CM | POA: Diagnosis not present

## 2021-11-05 DIAGNOSIS — N2581 Secondary hyperparathyroidism of renal origin: Secondary | ICD-10-CM | POA: Diagnosis not present

## 2021-11-05 DIAGNOSIS — D509 Iron deficiency anemia, unspecified: Secondary | ICD-10-CM | POA: Diagnosis not present

## 2021-11-05 DIAGNOSIS — Z992 Dependence on renal dialysis: Secondary | ICD-10-CM | POA: Diagnosis not present

## 2021-11-05 DIAGNOSIS — R52 Pain, unspecified: Secondary | ICD-10-CM | POA: Diagnosis not present

## 2021-11-05 DIAGNOSIS — D631 Anemia in chronic kidney disease: Secondary | ICD-10-CM | POA: Diagnosis not present

## 2021-11-06 ENCOUNTER — Other Ambulatory Visit (HOSPITAL_COMMUNITY): Payer: Self-pay

## 2021-11-06 DIAGNOSIS — E785 Hyperlipidemia, unspecified: Secondary | ICD-10-CM

## 2021-11-06 NOTE — Progress Notes (Signed)
Orders Placed This Encounter  Procedures   Lipid Profile    Standing Status:   Future    Standing Expiration Date:   11/06/2022    Order Specific Question:   Release to patient    Answer:   Immediate

## 2021-11-07 DIAGNOSIS — N2581 Secondary hyperparathyroidism of renal origin: Secondary | ICD-10-CM | POA: Diagnosis not present

## 2021-11-07 DIAGNOSIS — N186 End stage renal disease: Secondary | ICD-10-CM | POA: Diagnosis not present

## 2021-11-07 DIAGNOSIS — R52 Pain, unspecified: Secondary | ICD-10-CM | POA: Diagnosis not present

## 2021-11-07 DIAGNOSIS — Z992 Dependence on renal dialysis: Secondary | ICD-10-CM | POA: Diagnosis not present

## 2021-11-07 DIAGNOSIS — D509 Iron deficiency anemia, unspecified: Secondary | ICD-10-CM | POA: Diagnosis not present

## 2021-11-07 DIAGNOSIS — D631 Anemia in chronic kidney disease: Secondary | ICD-10-CM | POA: Diagnosis not present

## 2021-11-09 ENCOUNTER — Encounter (HOSPITAL_COMMUNITY): Payer: Self-pay

## 2021-11-09 ENCOUNTER — Other Ambulatory Visit (HOSPITAL_COMMUNITY): Payer: Self-pay

## 2021-11-09 ENCOUNTER — Other Ambulatory Visit: Payer: Self-pay

## 2021-11-09 ENCOUNTER — Ambulatory Visit (HOSPITAL_COMMUNITY)
Admission: RE | Admit: 2021-11-09 | Discharge: 2021-11-09 | Disposition: A | Discharge status: Home / Self Care | Payer: Medicare Other | Source: Ambulatory Visit | Attending: Internal Medicine | Admitting: Internal Medicine

## 2021-11-09 DIAGNOSIS — E785 Hyperlipidemia, unspecified: Secondary | ICD-10-CM

## 2021-11-09 NOTE — Progress Notes (Signed)
Orders Placed This Encounter  Procedures   Lipid panel    Standing Status:   Future    Standing Expiration Date:   11/09/2022    Order Specific Question:   Release to patient    Answer:   Immediate

## 2021-11-10 DIAGNOSIS — Z992 Dependence on renal dialysis: Secondary | ICD-10-CM | POA: Diagnosis not present

## 2021-11-10 DIAGNOSIS — D631 Anemia in chronic kidney disease: Secondary | ICD-10-CM | POA: Diagnosis not present

## 2021-11-10 DIAGNOSIS — N2581 Secondary hyperparathyroidism of renal origin: Secondary | ICD-10-CM | POA: Diagnosis not present

## 2021-11-10 DIAGNOSIS — R52 Pain, unspecified: Secondary | ICD-10-CM | POA: Diagnosis not present

## 2021-11-10 DIAGNOSIS — N186 End stage renal disease: Secondary | ICD-10-CM | POA: Diagnosis not present

## 2021-11-10 DIAGNOSIS — D509 Iron deficiency anemia, unspecified: Secondary | ICD-10-CM | POA: Diagnosis not present

## 2021-11-12 ENCOUNTER — Ambulatory Visit (INDEPENDENT_AMBULATORY_CARE_PROVIDER_SITE_OTHER): Payer: Medicare Other | Admitting: Allergy & Immunology

## 2021-11-12 ENCOUNTER — Other Ambulatory Visit: Payer: Self-pay

## 2021-11-12 ENCOUNTER — Encounter: Payer: Self-pay | Admitting: Allergy & Immunology

## 2021-11-12 VITALS — BP 124/78 | HR 62 | Temp 98.3°F | Resp 16 | Ht 67.0 in | Wt 268.6 lb

## 2021-11-12 DIAGNOSIS — T783XXD Angioneurotic edema, subsequent encounter: Secondary | ICD-10-CM

## 2021-11-12 DIAGNOSIS — N2581 Secondary hyperparathyroidism of renal origin: Secondary | ICD-10-CM | POA: Diagnosis not present

## 2021-11-12 DIAGNOSIS — J302 Other seasonal allergic rhinitis: Secondary | ICD-10-CM | POA: Diagnosis not present

## 2021-11-12 DIAGNOSIS — J4540 Moderate persistent asthma, uncomplicated: Secondary | ICD-10-CM | POA: Diagnosis not present

## 2021-11-12 DIAGNOSIS — R52 Pain, unspecified: Secondary | ICD-10-CM | POA: Diagnosis not present

## 2021-11-12 DIAGNOSIS — D631 Anemia in chronic kidney disease: Secondary | ICD-10-CM | POA: Diagnosis not present

## 2021-11-12 DIAGNOSIS — N186 End stage renal disease: Secondary | ICD-10-CM | POA: Diagnosis not present

## 2021-11-12 DIAGNOSIS — J3089 Other allergic rhinitis: Secondary | ICD-10-CM | POA: Insufficient documentation

## 2021-11-12 DIAGNOSIS — Z5320 Procedure and treatment not carried out because of patient's decision for unspecified reasons: Secondary | ICD-10-CM | POA: Diagnosis not present

## 2021-11-12 DIAGNOSIS — D509 Iron deficiency anemia, unspecified: Secondary | ICD-10-CM | POA: Diagnosis not present

## 2021-11-12 DIAGNOSIS — Z992 Dependence on renal dialysis: Secondary | ICD-10-CM | POA: Diagnosis not present

## 2021-11-12 MED ORDER — FAMOTIDINE 20 MG PO TABS
20.0000 mg | ORAL_TABLET | Freq: Two times a day (BID) | ORAL | 5 refills | Status: DC
  Filled 2021-11-12: qty 60, 30d supply, fill #0

## 2021-11-12 MED ORDER — CETIRIZINE HCL 10 MG PO TABS
10.0000 mg | ORAL_TABLET | Freq: Two times a day (BID) | ORAL | 5 refills | Status: DC
  Filled 2021-11-12: qty 60, 30d supply, fill #0

## 2021-11-12 NOTE — Patient Instructions (Signed)
1. Seasonal and perennial allergic rhinitis - Previous testing showed: grasses, weeds, and indoor molds - Start taking: Zyrtec (cetirizine) as below - You can use an extra dose of the antihistamine, if needed, for breakthrough symptoms.  - Consider nasal saline rinses 1-2 times daily to remove allergens from the nasal cavities as well as help with mucous clearance (this is especially helpful to do before the nasal sprays are given)  2.  Moderate persistent, uncomplicated - Lung testing was in the 50% range, but did improve to the low 60% range with the albuterol treatment. - I truly think that you need an every day controller medicine to keep her lung functioning at the best that they can. - You let us know when you are ready to pursue that. - In the meantime, continue with albuterol as needed. - You could also talk about this with Dr. Annamaria Boots when you follow-up with him for your CPAP visit.  3. Angioedema - Your previous lab work-up showed a very low sensitization to alpha gal (red meat allergy), but I am not sure is relevant since you eat it all the time and have no swelling episodes. - Your ESR which is a marker of inflammation was elevated, but that could just be because you were on dialysis and have your other chronic medical problems. - Otherwise your labs were normal. - I would like to get some labs to rule out hereditary angioedema, but we could not get blood today. - We can certainly consider doing this in the future. - In the meantime, continue suppressive dosing of antihistamines:   - Morning: Zyrtec (cetirizine) 10mg  (one tablet) + Pepcid (famotidine) 20mg   - Evening: Zyrtec (cetirizine) 10mg  (one tablet) + Pepcid (famotidine) 20mg  - You can change this dosing at home, decreasing the dose as needed or increasing the dosing as needed  4. Return in about 6 months (around 05/13/2022).    Please inform us of any Emergency Department visits, hospitalizations, or changes in symptoms.  Call us before going to the ED for breathing or allergy symptoms since we might be able to fit you in for a sick visit. Feel free to contact us anytime with any questions, problems, or concerns.  It was a pleasure to see you again today!  Websites that have reliable patient information: 1. American Academy of Asthma, Allergy, and Immunology: www.aaaai.org 2. Food Allergy Research and Education (FARE): foodallergy.org 3. Mothers of Asthmatics: http://www.asthmacommunitynetwork.org 4. American College of Allergy, Asthma, and Immunology: www.acaai.org   COVID-19 Vaccine Information can be found at: ShippingScam.co.uk For questions related to vaccine distribution or appointments, please email [email or web address] or call [phone].   We realize that you might be concerned about having an allergic reaction to the COVID19 vaccines. To help with that concern, WE ARE OFFERING THE COVID19 VACCINES IN OUR OFFICE! Ask the front desk for dates!     "Like" [place] on Facebook and Instagram for our latest updates!      A healthy democracy works best when [employer] participate! Make sure you are registered to vote! If you have moved or changed any of your contact information, you will need to get this updated before voting!  In some cases, you MAY be able to register to vote online: [email or web address]

## 2021-11-12 NOTE — Progress Notes (Signed)
FOLLOW UP  Date of Service/Encounter:  11/12/21   Assessment:   Moderate persistent asthma, uncomplicated - with refusal to use any type of controller medication   Seasonal and perennial allergic rhinitis (grasses, weeds, and indoor molds)   Angioedema - unknown trigger  CKD - on dialysis three times weekly   Plan/Recommendations:   1. Seasonal and perennial allergic rhinitis - Previous testing showed: grasses, weeds, and indoor molds - Start taking: Zyrtec (cetirizine) as below - You can use an extra dose of the antihistamine, if needed, for breakthrough symptoms.  - Consider nasal saline rinses 1-2 times daily to remove allergens from the nasal cavities as well as help with mucous clearance (this is especially helpful to do before the nasal sprays are given)  2.  Moderate persistent, uncomplicated - Lung testing was in the 50% range, but did improve to the low 60% range with the albuterol treatment. - I truly think that you need an every day controller medicine to keep her lung functioning at the best that they can. - You let us know when you are ready to pursue that. - In the meantime, continue with albuterol as needed. - You could also talk about this with Dr. Annamaria Boots when you follow-up with him for your CPAP visit.  3. Angioedema - Your previous lab work-up showed a very low sensitization to alpha gal (red meat allergy), but I am not sure is relevant since you eat it all the time and have no swelling episodes. - Your ESR which is a marker of inflammation was elevated, but that could just be because you were on dialysis and have your other chronic medical problems. - Otherwise your labs were normal. - I would like to get some labs to rule out hereditary angioedema, but we could not get blood today. - We can certainly consider doing this in the future. - In the meantime, continue suppressive dosing of antihistamines:   - Morning: Zyrtec (cetirizine) 10mg  (one tablet) +  Pepcid (famotidine) 20mg   - Evening: Zyrtec (cetirizine) 10mg  (one tablet) + Pepcid (famotidine) 20mg  - You can change this dosing at home, decreasing the dose as needed or increasing the dosing as needed  4. Return in about 6 months (around 05/13/2022).   Subjective:   Victor Thompson is a 60 y.o. male presenting today for follow up of  Chief Complaint  Patient presents with   Asthma    Says it is ok. Says he feels like he has fluid in the lungs. Says he has a dry cough. ACT:23    Victor Thompson has a history of the following: Patient Active Problem List   Diagnosis Date Noted   STD exposure 08/27/2021   ESRD on dialysis ([place]) 02/06/2021   Hypervolemia associated with renal insufficiency 01/12/2021   Angioedema 09/22/2020   Secondary hyperparathyroidism of renal origin ([place]) 07/15/2019   Vitamin D deficiency 07/15/2019   CAD (coronary artery disease), native coronary artery 04/10/2019   Chronic midline low back pain without sciatica 02/17/2018   VT (ventricular tachycardia) 03/29/2016   Defibrillator discharge    Chronic combined systolic and diastolic CHF (congestive heart failure) ([place]) 01/22/2016   Erectile dysfunction 01/22/2016   Allergic rhinitis 01/22/2016   GERD (gastroesophageal reflux disease) 01/22/2016   PROTEINURIA 02/17/2010   Cardiomyopathy, ischemic 02/16/2010   Obstructive sleep apnea 02/16/2010   EXTERNAL HEMORRHOIDS 09/26/2009   Tobacco use disorder in remission 06/03/2009   Essential hypertension 10/01/2008   Hyperlipidemia 07/14/2007   Class 3  severe obesity due to excess calories with serious comorbidity and body mass index (BMI) of 40.0 to 44.9 in adult [facility]) 07/14/2007   Asthma 07/14/2007    History obtained from: chart review and patient.  Victor Thompson is a 60 y.o. male presenting for a follow up visit.  He was last seen in October 2022 as a new patient.  At that time, he had testing that was positive to grasses, weeds, and molds.  We stopped his  Claritin and started Zyrtec.  For his angioedema, we obtained labs to rule out serious causes of swelling and hives.  We started Zyrtec and Pepcid twice daily.  Since the last visit, he has mostly done well.   Asthma/Respiratory Symptom History: He reports that he started having issues a couple of days ago with wheezing. He has been using his rescue inhaler without improvement.  He reports that this feels like he has some "fluid on [his] lungs". He is having a dry cough. He is not using anything in the way of a controller medication at all. He has never felt that it helps. He has been on Symbicort and Advair in the past. He has never been on Trelegy but he continuously tells me that he "has too many medications as it is".   Allergic Rhinitis Symptom History: He remains on the cetirizine. He does not use nose sprays at all.  He feels that the cetirizine is helping to control his symptoms. He has not needed antibiotics at all since the last visit.t  Skin Symptom History: He has had no episodes of swelling at all.  We did spend some time going through his labs today. He seems confused about the entire MyChart discussion; clearly he is signed up for it, but he is not readying it at all. There is no one else in the family with swelling episodes like he is experiencing.   He is on dialysis and did that today.   Otherwise, there have been no changes to his past medical history, surgical history, family history, or social history.    Review of Systems  Constitutional: Negative.  Negative for chills, fever, malaise/fatigue and weight loss.  HENT: Negative.  Negative for congestion, ear discharge, ear pain and sinus pain.   Eyes:  Negative for pain, discharge and redness.  Respiratory:  Positive for cough and shortness of breath. Negative for sputum production and wheezing.   Cardiovascular: Negative.  Negative for chest pain and palpitations.  Gastrointestinal:  Negative for abdominal pain, constipation,  diarrhea, heartburn, nausea and vomiting.  Skin: Negative.  Negative for itching and rash.  Neurological:  Negative for dizziness and headaches.  Endo/Heme/Allergies:  Positive for environmental allergies. Does not bruise/bleed easily.  All other systems reviewed and are negative.     Objective:   Blood pressure 124/78, pulse 62, temperature 98.3 F (36.8 C), temperature source Temporal, resp. rate 16, height $RemoveBe'5\' 7"'RROAWkBQA$  (1.702 m), weight 268 lb 9.6 oz (121.8 kg), SpO2 99 %. Body mass index is 42.07 kg/m.   Physical Exam:  Physical Exam Vitals reviewed.  Constitutional:      Appearance: He is well-developed. He is obese.  HENT:     Head: Normocephalic and atraumatic.     Right Ear: Tympanic membrane, ear canal and external ear normal. No drainage, swelling or tenderness. Tympanic membrane is not injected, scarred, erythematous, retracted or bulging.     Left Ear: Tympanic membrane, ear canal and external ear normal. No drainage, swelling or tenderness. Tympanic membrane is not  injected, scarred, erythematous, retracted or bulging.     Nose: No nasal deformity, septal deviation, mucosal edema or rhinorrhea.     Right Turbinates: Enlarged, swollen and pale.     Left Turbinates: Enlarged, swollen and pale.     Right Sinus: No maxillary sinus tenderness or frontal sinus tenderness.     Left Sinus: No maxillary sinus tenderness or frontal sinus tenderness.     Mouth/Throat:     Mouth: Mucous membranes are not pale and not dry.     Pharynx: Uvula midline.  Eyes:     General:        Right eye: No discharge.        Left eye: No discharge.     Conjunctiva/sclera: Conjunctivae normal.     Right eye: Right conjunctiva is not injected. No chemosis.    Left eye: Left conjunctiva is not injected. No chemosis.    Pupils: Pupils are equal, round, and reactive to light.  Cardiovascular:     Rate and Rhythm: Normal rate and regular rhythm.     Heart sounds: Normal heart sounds.  Pulmonary:      Effort: Pulmonary effort is normal. No tachypnea, accessory muscle usage or respiratory distress.     Breath sounds: Examination of the right-upper field reveals wheezing. Examination of the left-upper field reveals wheezing. Wheezing present. No rhonchi or rales.     Comments: Decreased air movement at the bases.  Chest:     Chest wall: No tenderness.  Abdominal:     Tenderness: There is no abdominal tenderness. There is no guarding or rebound.  Lymphadenopathy:     Head:     Right side of head: No submandibular, tonsillar or occipital adenopathy.     Left side of head: No submandibular, tonsillar or occipital adenopathy.     Cervical: No cervical adenopathy.  Skin:    General: Skin is warm.     Capillary Refill: Capillary refill takes less than 2 seconds.     Coloration: Skin is not pale.     Findings: No abrasion, erythema, petechiae or rash. Rash is not papular, urticarial or vesicular.  Neurological:     Mental Status: He is alert.  Psychiatric:        Behavior: Behavior is cooperative.     Diagnostic studies:    Spirometry: results abnormal (FEV1: 1.27/50%, FVC: 2.13/59%, FEV1/FVC: 60%).    Spirometry consistent with possible restrictive disease. Xopenex four puffs via MDI treatment given in clinic with significant improvement in FEV1 and FVC per ATS criteria.  Allergy Studies: none      Salvatore Marvel, MD  [facility] [place]

## 2021-11-13 ENCOUNTER — Other Ambulatory Visit: Payer: Self-pay

## 2021-11-14 DIAGNOSIS — N2581 Secondary hyperparathyroidism of renal origin: Secondary | ICD-10-CM | POA: Diagnosis not present

## 2021-11-14 DIAGNOSIS — Z992 Dependence on renal dialysis: Secondary | ICD-10-CM | POA: Diagnosis not present

## 2021-11-14 DIAGNOSIS — N186 End stage renal disease: Secondary | ICD-10-CM | POA: Diagnosis not present

## 2021-11-14 DIAGNOSIS — R52 Pain, unspecified: Secondary | ICD-10-CM | POA: Diagnosis not present

## 2021-11-14 DIAGNOSIS — D509 Iron deficiency anemia, unspecified: Secondary | ICD-10-CM | POA: Diagnosis not present

## 2021-11-14 DIAGNOSIS — D631 Anemia in chronic kidney disease: Secondary | ICD-10-CM | POA: Diagnosis not present

## 2021-11-15 DIAGNOSIS — G4733 Obstructive sleep apnea (adult) (pediatric): Secondary | ICD-10-CM | POA: Diagnosis not present

## 2021-11-16 ENCOUNTER — Telehealth: Payer: Self-pay

## 2021-11-16 DIAGNOSIS — H2513 Age-related nuclear cataract, bilateral: Secondary | ICD-10-CM | POA: Diagnosis not present

## 2021-11-16 NOTE — Telephone Encounter (Signed)
PA for Auryxia approved until 12/05/22

## 2021-11-17 DIAGNOSIS — D631 Anemia in chronic kidney disease: Secondary | ICD-10-CM | POA: Diagnosis not present

## 2021-11-17 DIAGNOSIS — D509 Iron deficiency anemia, unspecified: Secondary | ICD-10-CM | POA: Diagnosis not present

## 2021-11-17 DIAGNOSIS — R52 Pain, unspecified: Secondary | ICD-10-CM | POA: Diagnosis not present

## 2021-11-17 DIAGNOSIS — Z992 Dependence on renal dialysis: Secondary | ICD-10-CM | POA: Diagnosis not present

## 2021-11-17 DIAGNOSIS — N2581 Secondary hyperparathyroidism of renal origin: Secondary | ICD-10-CM | POA: Diagnosis not present

## 2021-11-17 DIAGNOSIS — N186 End stage renal disease: Secondary | ICD-10-CM | POA: Diagnosis not present

## 2021-11-19 DIAGNOSIS — D631 Anemia in chronic kidney disease: Secondary | ICD-10-CM | POA: Diagnosis not present

## 2021-11-19 DIAGNOSIS — R52 Pain, unspecified: Secondary | ICD-10-CM | POA: Diagnosis not present

## 2021-11-19 DIAGNOSIS — N2581 Secondary hyperparathyroidism of renal origin: Secondary | ICD-10-CM | POA: Diagnosis not present

## 2021-11-19 DIAGNOSIS — D509 Iron deficiency anemia, unspecified: Secondary | ICD-10-CM | POA: Diagnosis not present

## 2021-11-19 DIAGNOSIS — Z992 Dependence on renal dialysis: Secondary | ICD-10-CM | POA: Diagnosis not present

## 2021-11-19 DIAGNOSIS — N186 End stage renal disease: Secondary | ICD-10-CM | POA: Diagnosis not present

## 2021-11-20 ENCOUNTER — Other Ambulatory Visit: Payer: Self-pay

## 2021-11-21 DIAGNOSIS — D509 Iron deficiency anemia, unspecified: Secondary | ICD-10-CM | POA: Diagnosis not present

## 2021-11-21 DIAGNOSIS — D631 Anemia in chronic kidney disease: Secondary | ICD-10-CM | POA: Diagnosis not present

## 2021-11-21 DIAGNOSIS — Z992 Dependence on renal dialysis: Secondary | ICD-10-CM | POA: Diagnosis not present

## 2021-11-21 DIAGNOSIS — N2581 Secondary hyperparathyroidism of renal origin: Secondary | ICD-10-CM | POA: Diagnosis not present

## 2021-11-21 DIAGNOSIS — R52 Pain, unspecified: Secondary | ICD-10-CM | POA: Diagnosis not present

## 2021-11-21 DIAGNOSIS — N186 End stage renal disease: Secondary | ICD-10-CM | POA: Diagnosis not present

## 2021-11-24 DIAGNOSIS — Z992 Dependence on renal dialysis: Secondary | ICD-10-CM | POA: Diagnosis not present

## 2021-11-24 DIAGNOSIS — N186 End stage renal disease: Secondary | ICD-10-CM | POA: Diagnosis not present

## 2021-11-24 DIAGNOSIS — D509 Iron deficiency anemia, unspecified: Secondary | ICD-10-CM | POA: Diagnosis not present

## 2021-11-24 DIAGNOSIS — N2581 Secondary hyperparathyroidism of renal origin: Secondary | ICD-10-CM | POA: Diagnosis not present

## 2021-11-24 DIAGNOSIS — R52 Pain, unspecified: Secondary | ICD-10-CM | POA: Diagnosis not present

## 2021-11-24 DIAGNOSIS — D631 Anemia in chronic kidney disease: Secondary | ICD-10-CM | POA: Diagnosis not present

## 2021-11-26 DIAGNOSIS — D509 Iron deficiency anemia, unspecified: Secondary | ICD-10-CM | POA: Diagnosis not present

## 2021-11-26 DIAGNOSIS — N186 End stage renal disease: Secondary | ICD-10-CM | POA: Diagnosis not present

## 2021-11-26 DIAGNOSIS — Z992 Dependence on renal dialysis: Secondary | ICD-10-CM | POA: Diagnosis not present

## 2021-11-26 DIAGNOSIS — R52 Pain, unspecified: Secondary | ICD-10-CM | POA: Diagnosis not present

## 2021-11-26 DIAGNOSIS — D631 Anemia in chronic kidney disease: Secondary | ICD-10-CM | POA: Diagnosis not present

## 2021-11-26 DIAGNOSIS — N2581 Secondary hyperparathyroidism of renal origin: Secondary | ICD-10-CM | POA: Diagnosis not present

## 2021-11-28 DIAGNOSIS — D509 Iron deficiency anemia, unspecified: Secondary | ICD-10-CM | POA: Diagnosis not present

## 2021-11-28 DIAGNOSIS — Z992 Dependence on renal dialysis: Secondary | ICD-10-CM | POA: Diagnosis not present

## 2021-11-28 DIAGNOSIS — N186 End stage renal disease: Secondary | ICD-10-CM | POA: Diagnosis not present

## 2021-11-28 DIAGNOSIS — N2581 Secondary hyperparathyroidism of renal origin: Secondary | ICD-10-CM | POA: Diagnosis not present

## 2021-11-28 DIAGNOSIS — R52 Pain, unspecified: Secondary | ICD-10-CM | POA: Diagnosis not present

## 2021-11-28 DIAGNOSIS — D631 Anemia in chronic kidney disease: Secondary | ICD-10-CM | POA: Diagnosis not present

## 2021-12-01 ENCOUNTER — Other Ambulatory Visit: Payer: Self-pay | Admitting: Internal Medicine

## 2021-12-01 ENCOUNTER — Other Ambulatory Visit: Payer: Self-pay

## 2021-12-01 DIAGNOSIS — D631 Anemia in chronic kidney disease: Secondary | ICD-10-CM | POA: Diagnosis not present

## 2021-12-01 DIAGNOSIS — N2581 Secondary hyperparathyroidism of renal origin: Secondary | ICD-10-CM | POA: Diagnosis not present

## 2021-12-01 DIAGNOSIS — R52 Pain, unspecified: Secondary | ICD-10-CM | POA: Diagnosis not present

## 2021-12-01 DIAGNOSIS — I2510 Atherosclerotic heart disease of native coronary artery without angina pectoris: Secondary | ICD-10-CM

## 2021-12-01 DIAGNOSIS — N186 End stage renal disease: Secondary | ICD-10-CM | POA: Diagnosis not present

## 2021-12-01 DIAGNOSIS — Z992 Dependence on renal dialysis: Secondary | ICD-10-CM | POA: Diagnosis not present

## 2021-12-01 DIAGNOSIS — D509 Iron deficiency anemia, unspecified: Secondary | ICD-10-CM | POA: Diagnosis not present

## 2021-12-01 DIAGNOSIS — I10 Essential (primary) hypertension: Secondary | ICD-10-CM

## 2021-12-01 DIAGNOSIS — I5042 Chronic combined systolic (congestive) and diastolic (congestive) heart failure: Secondary | ICD-10-CM

## 2021-12-01 MED ORDER — CARVEDILOL 25 MG PO TABS
ORAL_TABLET | Freq: Two times a day (BID) | ORAL | 1 refills | Status: DC
  Filled 2021-12-01: qty 180, 90d supply, fill #0

## 2021-12-02 ENCOUNTER — Other Ambulatory Visit: Payer: Self-pay

## 2021-12-02 NOTE — Progress Notes (Signed)
HPI male former smoker followed for Angioedema, OSA, complicated by CKD, HBP Unattended Home Sleep Test 05/18/2016-AHI 60.7/hour, desaturation to 79%, body weight 289.5 pounds  ===============================================================  07/16/21- 59yoM former smoker coming to re-establish for OSA, complicated by hx Angioedema, CKD,/ ESRD/Dialysis,  HBP, CAD/M/ AICD, CHF, CM, Renal Hyperparathyroidism, Morbid Obesity,  Epworth score -2 CPAP- 5-20/ [facility] Download- Body weight today- 260 lbs Covid vax- ED visit 8/8 for tongue swelling> Pepcid, Epinephrine, Solumedrol, Benadryl,  ------Re-establish care for OSA. States he needs a new cpap machine. His current machine is working but he is unsure of how much longer it will work.  He has been using CPAP every night. Housing is beginning to break and [facility] told him replacement would take 5 months. Discussed supply chain, alternative providers.  Angioedema- he asked for help seeing an allergist after recent ER visit CXR 01/11/21- IMPRESSION: No active cardiopulmonary disease  12/03/21- 64yoM former smoker coming to re-establish for OSA, complicated by hx Angioedema, CKD,/ ESRD/Dialysis,  HBP, CAD/M/ AICD, CHF, CM, Renal Hyperparathyroidism, Morbid Obesity,  Epworth score -2 CPAP- 5-20/ [facility]         [place] machine auto Download- compliance  85.7%  , AHI 2.5/ hr Body weight today- 269 lbs Covid vax-2 Phizer Flu vax- none Patient doing good, no concerns He actually admits to me that he "fights" with his CPAP mask.  Fullface mask.  He salivates and lies with mouth open.  We discussed chinstrap and we can also try mask fitting and desensitization to see if we can help him get more comfortable.  Download reviewed with him.  ROS-see HPI  + = positive Constitutional:    weight loss, night sweats, fevers, chills, fatigue, lassitude. HEENT:    headaches, difficulty swallowing, tooth/dental problems, sore throat,       sneezing, itching, ear ache,  nasal congestion, post nasal drip, snoring CV:    chest pain, orthopnea, PND, swelling in lower extremities, anasarca,                                                        dizziness, palpitations Resp:   shortness of breath with exertion or at rest.                productive cough,   non-productive cough, coughing up of blood.              change in color of mucus.  wheezing.   Skin:   Clear GI:  No-   heartburn, indigestion, abdominal pain, nausea, vomiting, diarrhea,                 change in bowel habits, loss of appetite GU: dysuria, change in color of urine, no urgency or frequency.   flank pain. MS:   joint pain, stiffness, decreased range of motion, back pain. Neuro-     nothing unusual Psych:  change in mood or affect.  depression or anxiety.   memory loss.  OBJ- Physical Exam General- Alert, Oriented, Affect-appropriate, Distress- none acute, + obese Skin-clear Lymphadenopathy- none Head- atraumatic            Eyes- Gross vision intact, PERRLA, conjunctivae and secretions clear            Ears- Hearing, canals-normal            Nose-  Clear, no-Septal dev, mucus, polyps, erosion, perforation             Throat- Mallampati IV , mucosa clear , drainage- none, tonsils- atrophic Neck- flexible , trachea midline, no stridor , thyroid nl, carotid no bruit Chest - symmetrical excursion , unlabored           Heart/CV- RRR , no murmur , no gallop  , no rub, nl s1 s2                           - JVD- none , edema- none, stasis changes- none, varices- none           Lung- clear to P&A, wheeze- none, cough- none , dullness-none, rub- none           Chest wall-  Abd-  Br/ Gen/ Rectal- Not done, not indicated Extrem- cyanosis- none, clubbing, none, atrophy- none, strength- nl Neuro- grossly intact to observation,

## 2021-12-03 ENCOUNTER — Encounter: Payer: Self-pay | Admitting: Internal Medicine

## 2021-12-03 ENCOUNTER — Ambulatory Visit (INDEPENDENT_AMBULATORY_CARE_PROVIDER_SITE_OTHER): Payer: Medicare Other | Admitting: Internal Medicine

## 2021-12-03 ENCOUNTER — Other Ambulatory Visit: Payer: Self-pay

## 2021-12-03 DIAGNOSIS — N186 End stage renal disease: Secondary | ICD-10-CM | POA: Diagnosis not present

## 2021-12-03 DIAGNOSIS — J4540 Moderate persistent asthma, uncomplicated: Secondary | ICD-10-CM | POA: Diagnosis not present

## 2021-12-03 DIAGNOSIS — T783XXD Angioneurotic edema, subsequent encounter: Secondary | ICD-10-CM

## 2021-12-03 DIAGNOSIS — Z992 Dependence on renal dialysis: Secondary | ICD-10-CM | POA: Diagnosis not present

## 2021-12-03 DIAGNOSIS — N2581 Secondary hyperparathyroidism of renal origin: Secondary | ICD-10-CM | POA: Diagnosis not present

## 2021-12-03 DIAGNOSIS — G4733 Obstructive sleep apnea (adult) (pediatric): Secondary | ICD-10-CM | POA: Diagnosis not present

## 2021-12-03 DIAGNOSIS — R52 Pain, unspecified: Secondary | ICD-10-CM | POA: Diagnosis not present

## 2021-12-03 DIAGNOSIS — D631 Anemia in chronic kidney disease: Secondary | ICD-10-CM | POA: Diagnosis not present

## 2021-12-03 DIAGNOSIS — D509 Iron deficiency anemia, unspecified: Secondary | ICD-10-CM | POA: Diagnosis not present

## 2021-12-03 NOTE — Assessment & Plan Note (Signed)
He has had allergy skin testing and evaluation.

## 2021-12-03 NOTE — Assessment & Plan Note (Signed)
He benefits from CPAP with good compliance and control but admits to struggling with mask, mouth stays open and he drools. Plan-mask fitting and desensitization, chinstrap.  Continue auto 5-20

## 2021-12-03 NOTE — Patient Instructions (Signed)
Order- DME [facility]- continue CPAP auto 5-20, mask of choice, humidifier, supplies,             Please provide SD card and Chin Strap  Order-referral to sleep center for mask fitting/ desensitization

## 2021-12-03 NOTE — Assessment & Plan Note (Signed)
Chest is clear now, very slight cough noted once.  No recent exacerbation.

## 2021-12-04 NOTE — Addendum Note (Signed)
Addended by: Elby Beck R on: 12/04/2021 10:56 AM   Modules accepted: Orders

## 2021-12-05 DIAGNOSIS — I129 Hypertensive chronic kidney disease with stage 1 through stage 4 chronic kidney disease, or unspecified chronic kidney disease: Secondary | ICD-10-CM | POA: Diagnosis not present

## 2021-12-05 DIAGNOSIS — R52 Pain, unspecified: Secondary | ICD-10-CM | POA: Diagnosis not present

## 2021-12-05 DIAGNOSIS — N2581 Secondary hyperparathyroidism of renal origin: Secondary | ICD-10-CM | POA: Diagnosis not present

## 2021-12-05 DIAGNOSIS — Z992 Dependence on renal dialysis: Secondary | ICD-10-CM | POA: Diagnosis not present

## 2021-12-05 DIAGNOSIS — N186 End stage renal disease: Secondary | ICD-10-CM | POA: Diagnosis not present

## 2021-12-05 DIAGNOSIS — D509 Iron deficiency anemia, unspecified: Secondary | ICD-10-CM | POA: Diagnosis not present

## 2021-12-05 DIAGNOSIS — D631 Anemia in chronic kidney disease: Secondary | ICD-10-CM | POA: Diagnosis not present

## 2021-12-08 DIAGNOSIS — D509 Iron deficiency anemia, unspecified: Secondary | ICD-10-CM | POA: Diagnosis not present

## 2021-12-08 DIAGNOSIS — Z992 Dependence on renal dialysis: Secondary | ICD-10-CM | POA: Diagnosis not present

## 2021-12-08 DIAGNOSIS — N2581 Secondary hyperparathyroidism of renal origin: Secondary | ICD-10-CM | POA: Diagnosis not present

## 2021-12-08 DIAGNOSIS — D631 Anemia in chronic kidney disease: Secondary | ICD-10-CM | POA: Diagnosis not present

## 2021-12-08 DIAGNOSIS — N186 End stage renal disease: Secondary | ICD-10-CM | POA: Diagnosis not present

## 2021-12-10 DIAGNOSIS — D509 Iron deficiency anemia, unspecified: Secondary | ICD-10-CM | POA: Diagnosis not present

## 2021-12-10 DIAGNOSIS — N186 End stage renal disease: Secondary | ICD-10-CM | POA: Diagnosis not present

## 2021-12-10 DIAGNOSIS — N2581 Secondary hyperparathyroidism of renal origin: Secondary | ICD-10-CM | POA: Diagnosis not present

## 2021-12-10 DIAGNOSIS — Z992 Dependence on renal dialysis: Secondary | ICD-10-CM | POA: Diagnosis not present

## 2021-12-10 DIAGNOSIS — D631 Anemia in chronic kidney disease: Secondary | ICD-10-CM | POA: Diagnosis not present

## 2021-12-12 DIAGNOSIS — Z992 Dependence on renal dialysis: Secondary | ICD-10-CM | POA: Diagnosis not present

## 2021-12-12 DIAGNOSIS — D509 Iron deficiency anemia, unspecified: Secondary | ICD-10-CM | POA: Diagnosis not present

## 2021-12-12 DIAGNOSIS — D631 Anemia in chronic kidney disease: Secondary | ICD-10-CM | POA: Diagnosis not present

## 2021-12-12 DIAGNOSIS — N186 End stage renal disease: Secondary | ICD-10-CM | POA: Diagnosis not present

## 2021-12-12 DIAGNOSIS — N2581 Secondary hyperparathyroidism of renal origin: Secondary | ICD-10-CM | POA: Diagnosis not present

## 2021-12-14 ENCOUNTER — Ambulatory Visit (INDEPENDENT_AMBULATORY_CARE_PROVIDER_SITE_OTHER): Payer: Self-pay

## 2021-12-14 ENCOUNTER — Other Ambulatory Visit: Payer: Self-pay

## 2021-12-14 DIAGNOSIS — I255 Ischemic cardiomyopathy: Secondary | ICD-10-CM

## 2021-12-14 DIAGNOSIS — I5042 Chronic combined systolic (congestive) and diastolic (congestive) heart failure: Secondary | ICD-10-CM

## 2021-12-14 DIAGNOSIS — I2510 Atherosclerotic heart disease of native coronary artery without angina pectoris: Secondary | ICD-10-CM

## 2021-12-14 NOTE — Telephone Encounter (Signed)
This is a CHF pt, Dr. Aundra Dubin, pt needing Rx sent to a different pharmacy. Please address

## 2021-12-15 DIAGNOSIS — D509 Iron deficiency anemia, unspecified: Secondary | ICD-10-CM | POA: Diagnosis not present

## 2021-12-15 DIAGNOSIS — Z992 Dependence on renal dialysis: Secondary | ICD-10-CM | POA: Diagnosis not present

## 2021-12-15 DIAGNOSIS — N2581 Secondary hyperparathyroidism of renal origin: Secondary | ICD-10-CM | POA: Diagnosis not present

## 2021-12-15 DIAGNOSIS — N186 End stage renal disease: Secondary | ICD-10-CM | POA: Diagnosis not present

## 2021-12-15 DIAGNOSIS — D631 Anemia in chronic kidney disease: Secondary | ICD-10-CM | POA: Diagnosis not present

## 2021-12-15 LAB — CUP PACEART REMOTE DEVICE CHECK
Battery Remaining Percentage: 87 %
Date Time Interrogation Session: 20230109172600
Implantable Lead Implant Date: 20150507
Implantable Lead Implant Date: 20150507
Implantable Lead Location: 753862
Implantable Lead Location: 753862
Implantable Lead Model: 3400
Implantable Lead Model: 3400
Implantable Pulse Generator Implant Date: 20211108
Pulse Gen Serial Number: 146303

## 2021-12-15 MED ORDER — HYDRALAZINE HCL 25 MG PO TABS: 75.0000 mg | ORAL_TABLET | Freq: Three times a day (TID) | ORAL | 11 refills | Status: DC

## 2021-12-15 MED ORDER — ISOSORBIDE MONONITRATE ER 60 MG PO TB24: 60.0000 mg | ORAL_TABLET | Freq: Every day | ORAL | 3 refills | Status: DC

## 2021-12-17 DIAGNOSIS — D509 Iron deficiency anemia, unspecified: Secondary | ICD-10-CM | POA: Diagnosis not present

## 2021-12-17 DIAGNOSIS — N2581 Secondary hyperparathyroidism of renal origin: Secondary | ICD-10-CM | POA: Diagnosis not present

## 2021-12-17 DIAGNOSIS — N186 End stage renal disease: Secondary | ICD-10-CM | POA: Diagnosis not present

## 2021-12-17 DIAGNOSIS — D631 Anemia in chronic kidney disease: Secondary | ICD-10-CM | POA: Diagnosis not present

## 2021-12-17 DIAGNOSIS — Z992 Dependence on renal dialysis: Secondary | ICD-10-CM | POA: Diagnosis not present

## 2021-12-19 DIAGNOSIS — N2581 Secondary hyperparathyroidism of renal origin: Secondary | ICD-10-CM | POA: Diagnosis not present

## 2021-12-19 DIAGNOSIS — N186 End stage renal disease: Secondary | ICD-10-CM | POA: Diagnosis not present

## 2021-12-19 DIAGNOSIS — D631 Anemia in chronic kidney disease: Secondary | ICD-10-CM | POA: Diagnosis not present

## 2021-12-19 DIAGNOSIS — D509 Iron deficiency anemia, unspecified: Secondary | ICD-10-CM | POA: Diagnosis not present

## 2021-12-19 DIAGNOSIS — Z992 Dependence on renal dialysis: Secondary | ICD-10-CM | POA: Diagnosis not present

## 2021-12-22 DIAGNOSIS — D631 Anemia in chronic kidney disease: Secondary | ICD-10-CM | POA: Diagnosis not present

## 2021-12-22 DIAGNOSIS — N2581 Secondary hyperparathyroidism of renal origin: Secondary | ICD-10-CM | POA: Diagnosis not present

## 2021-12-22 DIAGNOSIS — D509 Iron deficiency anemia, unspecified: Secondary | ICD-10-CM | POA: Diagnosis not present

## 2021-12-22 DIAGNOSIS — Z992 Dependence on renal dialysis: Secondary | ICD-10-CM | POA: Diagnosis not present

## 2021-12-22 DIAGNOSIS — N186 End stage renal disease: Secondary | ICD-10-CM | POA: Diagnosis not present

## 2021-12-22 NOTE — Progress Notes (Signed)
Remote ICD transmission.   

## 2021-12-24 DIAGNOSIS — D631 Anemia in chronic kidney disease: Secondary | ICD-10-CM | POA: Diagnosis not present

## 2021-12-24 DIAGNOSIS — D509 Iron deficiency anemia, unspecified: Secondary | ICD-10-CM | POA: Diagnosis not present

## 2021-12-24 DIAGNOSIS — Z992 Dependence on renal dialysis: Secondary | ICD-10-CM | POA: Diagnosis not present

## 2021-12-24 DIAGNOSIS — N186 End stage renal disease: Secondary | ICD-10-CM | POA: Diagnosis not present

## 2021-12-24 DIAGNOSIS — N2581 Secondary hyperparathyroidism of renal origin: Secondary | ICD-10-CM | POA: Diagnosis not present

## 2021-12-26 DIAGNOSIS — D631 Anemia in chronic kidney disease: Secondary | ICD-10-CM | POA: Diagnosis not present

## 2021-12-26 DIAGNOSIS — Z992 Dependence on renal dialysis: Secondary | ICD-10-CM | POA: Diagnosis not present

## 2021-12-26 DIAGNOSIS — N2581 Secondary hyperparathyroidism of renal origin: Secondary | ICD-10-CM | POA: Diagnosis not present

## 2021-12-26 DIAGNOSIS — N186 End stage renal disease: Secondary | ICD-10-CM | POA: Diagnosis not present

## 2021-12-26 DIAGNOSIS — D509 Iron deficiency anemia, unspecified: Secondary | ICD-10-CM | POA: Diagnosis not present

## 2021-12-27 ENCOUNTER — Other Ambulatory Visit: Payer: Self-pay | Admitting: Internal Medicine

## 2021-12-27 NOTE — Telephone Encounter (Signed)
Requested medication (s) are due for refill today: yes  Requested medication (s) are on the active medication list: yes  Last refill:  12/08/20  Future visit scheduled: yes  Notes to clinic:  Med expired today 12/08/21   Requested Prescriptions  Pending Prescriptions Disp Refills   albuterol (PROVENTIL) (2.5 MG/3ML) 0.083% nebulizer solution 75 mL 12    Sig: TAKE 3 MLS (2.5 MG TOTAL) BY NEBULIZATION EVERY 6 (SIX) HOURS AS NEEDED FOR WHEEZING OR SHORTNESS OF BREATH.     Pulmonology:  Beta Agonists Failed - 12/27/2021  3:42 PM      Failed - One inhaler should last at least one month. If the patient is requesting refills earlier, contact the patient to check for uncontrolled symptoms.      Passed - Valid encounter within last 12 months    Recent Outpatient Visits           2 months ago Chronic combined systolic and diastolic CHF (congestive heart failure) ([place])   [place] [name], MD   6 months ago Obstructive sleep apnea   [place], [place]   10 months ago Essential hypertension   [place], [place]   11 months ago COVID-19 virus infection   [place], Dionne Bu[place], [place]   1 year ago Hospital discharge follow-up   [place], [place]       Future Appointments             In 4 months Ernst Bowler Gwenith Daily, MD [facility] [place]

## 2021-12-29 DIAGNOSIS — Z992 Dependence on renal dialysis: Secondary | ICD-10-CM | POA: Diagnosis not present

## 2021-12-29 DIAGNOSIS — D631 Anemia in chronic kidney disease: Secondary | ICD-10-CM | POA: Diagnosis not present

## 2021-12-29 DIAGNOSIS — D509 Iron deficiency anemia, unspecified: Secondary | ICD-10-CM | POA: Diagnosis not present

## 2021-12-29 DIAGNOSIS — N2581 Secondary hyperparathyroidism of renal origin: Secondary | ICD-10-CM | POA: Diagnosis not present

## 2021-12-29 DIAGNOSIS — N186 End stage renal disease: Secondary | ICD-10-CM | POA: Diagnosis not present

## 2021-12-30 ENCOUNTER — Other Ambulatory Visit (HOSPITAL_BASED_OUTPATIENT_CLINIC_OR_DEPARTMENT_OTHER): Payer: Medicare Other | Admitting: Internal Medicine

## 2021-12-30 ENCOUNTER — Other Ambulatory Visit: Payer: Self-pay

## 2021-12-30 MED ORDER — ALBUTEROL SULFATE (2.5 MG/3ML) 0.083% IN NEBU
3.0000 mL | INHALATION_SOLUTION | Freq: Four times a day (QID) | RESPIRATORY_TRACT | 12 refills | Status: DC | PRN
  Filled 2021-12-30: qty 150, 13d supply, fill #0
  Filled 2021-12-30: qty 75, 7d supply, fill #0

## 2021-12-31 DIAGNOSIS — N186 End stage renal disease: Secondary | ICD-10-CM | POA: Diagnosis not present

## 2021-12-31 DIAGNOSIS — D631 Anemia in chronic kidney disease: Secondary | ICD-10-CM | POA: Diagnosis not present

## 2021-12-31 DIAGNOSIS — N2581 Secondary hyperparathyroidism of renal origin: Secondary | ICD-10-CM | POA: Diagnosis not present

## 2021-12-31 DIAGNOSIS — Z992 Dependence on renal dialysis: Secondary | ICD-10-CM | POA: Diagnosis not present

## 2021-12-31 DIAGNOSIS — D509 Iron deficiency anemia, unspecified: Secondary | ICD-10-CM | POA: Diagnosis not present

## 2022-01-02 DIAGNOSIS — N186 End stage renal disease: Secondary | ICD-10-CM | POA: Diagnosis not present

## 2022-01-02 DIAGNOSIS — Z992 Dependence on renal dialysis: Secondary | ICD-10-CM | POA: Diagnosis not present

## 2022-01-02 DIAGNOSIS — N2581 Secondary hyperparathyroidism of renal origin: Secondary | ICD-10-CM | POA: Diagnosis not present

## 2022-01-02 DIAGNOSIS — D509 Iron deficiency anemia, unspecified: Secondary | ICD-10-CM | POA: Diagnosis not present

## 2022-01-02 DIAGNOSIS — D631 Anemia in chronic kidney disease: Secondary | ICD-10-CM | POA: Diagnosis not present

## 2022-01-04 ENCOUNTER — Other Ambulatory Visit: Payer: Self-pay

## 2022-01-05 DIAGNOSIS — N2581 Secondary hyperparathyroidism of renal origin: Secondary | ICD-10-CM | POA: Diagnosis not present

## 2022-01-05 DIAGNOSIS — D631 Anemia in chronic kidney disease: Secondary | ICD-10-CM | POA: Diagnosis not present

## 2022-01-05 DIAGNOSIS — Z992 Dependence on renal dialysis: Secondary | ICD-10-CM | POA: Diagnosis not present

## 2022-01-05 DIAGNOSIS — I129 Hypertensive chronic kidney disease with stage 1 through stage 4 chronic kidney disease, or unspecified chronic kidney disease: Secondary | ICD-10-CM | POA: Diagnosis not present

## 2022-01-05 DIAGNOSIS — D509 Iron deficiency anemia, unspecified: Secondary | ICD-10-CM | POA: Diagnosis not present

## 2022-01-05 DIAGNOSIS — N186 End stage renal disease: Secondary | ICD-10-CM | POA: Diagnosis not present

## 2022-01-06 DIAGNOSIS — G4733 Obstructive sleep apnea (adult) (pediatric): Secondary | ICD-10-CM | POA: Diagnosis not present

## 2022-01-07 ENCOUNTER — Other Ambulatory Visit: Payer: Self-pay

## 2022-01-07 DIAGNOSIS — N2581 Secondary hyperparathyroidism of renal origin: Secondary | ICD-10-CM | POA: Diagnosis not present

## 2022-01-07 DIAGNOSIS — Z992 Dependence on renal dialysis: Secondary | ICD-10-CM | POA: Diagnosis not present

## 2022-01-07 DIAGNOSIS — D631 Anemia in chronic kidney disease: Secondary | ICD-10-CM | POA: Diagnosis not present

## 2022-01-07 DIAGNOSIS — D509 Iron deficiency anemia, unspecified: Secondary | ICD-10-CM | POA: Diagnosis not present

## 2022-01-07 DIAGNOSIS — N186 End stage renal disease: Secondary | ICD-10-CM | POA: Diagnosis not present

## 2022-01-09 DIAGNOSIS — D631 Anemia in chronic kidney disease: Secondary | ICD-10-CM | POA: Diagnosis not present

## 2022-01-09 DIAGNOSIS — Z992 Dependence on renal dialysis: Secondary | ICD-10-CM | POA: Diagnosis not present

## 2022-01-09 DIAGNOSIS — N186 End stage renal disease: Secondary | ICD-10-CM | POA: Diagnosis not present

## 2022-01-09 DIAGNOSIS — D509 Iron deficiency anemia, unspecified: Secondary | ICD-10-CM | POA: Diagnosis not present

## 2022-01-09 DIAGNOSIS — N2581 Secondary hyperparathyroidism of renal origin: Secondary | ICD-10-CM | POA: Diagnosis not present

## 2022-01-12 ENCOUNTER — Ambulatory Visit (HOSPITAL_BASED_OUTPATIENT_CLINIC_OR_DEPARTMENT_OTHER): Payer: Medicare Other | Attending: Internal Medicine | Admitting: Internal Medicine

## 2022-01-12 DIAGNOSIS — Z992 Dependence on renal dialysis: Secondary | ICD-10-CM | POA: Diagnosis not present

## 2022-01-12 DIAGNOSIS — N2581 Secondary hyperparathyroidism of renal origin: Secondary | ICD-10-CM | POA: Diagnosis not present

## 2022-01-12 DIAGNOSIS — D509 Iron deficiency anemia, unspecified: Secondary | ICD-10-CM | POA: Diagnosis not present

## 2022-01-12 DIAGNOSIS — N186 End stage renal disease: Secondary | ICD-10-CM | POA: Diagnosis not present

## 2022-01-12 DIAGNOSIS — D631 Anemia in chronic kidney disease: Secondary | ICD-10-CM | POA: Diagnosis not present

## 2022-01-12 DIAGNOSIS — G4733 Obstructive sleep apnea (adult) (pediatric): Secondary | ICD-10-CM

## 2022-01-14 DIAGNOSIS — D631 Anemia in chronic kidney disease: Secondary | ICD-10-CM | POA: Diagnosis not present

## 2022-01-14 DIAGNOSIS — D509 Iron deficiency anemia, unspecified: Secondary | ICD-10-CM | POA: Diagnosis not present

## 2022-01-14 DIAGNOSIS — N2581 Secondary hyperparathyroidism of renal origin: Secondary | ICD-10-CM | POA: Diagnosis not present

## 2022-01-14 DIAGNOSIS — N186 End stage renal disease: Secondary | ICD-10-CM | POA: Diagnosis not present

## 2022-01-14 DIAGNOSIS — Z992 Dependence on renal dialysis: Secondary | ICD-10-CM | POA: Diagnosis not present

## 2022-01-16 DIAGNOSIS — D631 Anemia in chronic kidney disease: Secondary | ICD-10-CM | POA: Diagnosis not present

## 2022-01-16 DIAGNOSIS — N2581 Secondary hyperparathyroidism of renal origin: Secondary | ICD-10-CM | POA: Diagnosis not present

## 2022-01-16 DIAGNOSIS — D509 Iron deficiency anemia, unspecified: Secondary | ICD-10-CM | POA: Diagnosis not present

## 2022-01-16 DIAGNOSIS — Z992 Dependence on renal dialysis: Secondary | ICD-10-CM | POA: Diagnosis not present

## 2022-01-16 DIAGNOSIS — N186 End stage renal disease: Secondary | ICD-10-CM | POA: Diagnosis not present

## 2022-01-19 DIAGNOSIS — D509 Iron deficiency anemia, unspecified: Secondary | ICD-10-CM | POA: Diagnosis not present

## 2022-01-19 DIAGNOSIS — N2581 Secondary hyperparathyroidism of renal origin: Secondary | ICD-10-CM | POA: Diagnosis not present

## 2022-01-19 DIAGNOSIS — Z992 Dependence on renal dialysis: Secondary | ICD-10-CM | POA: Diagnosis not present

## 2022-01-19 DIAGNOSIS — N186 End stage renal disease: Secondary | ICD-10-CM | POA: Diagnosis not present

## 2022-01-19 DIAGNOSIS — D631 Anemia in chronic kidney disease: Secondary | ICD-10-CM | POA: Diagnosis not present

## 2022-01-21 DIAGNOSIS — N2581 Secondary hyperparathyroidism of renal origin: Secondary | ICD-10-CM | POA: Diagnosis not present

## 2022-01-21 DIAGNOSIS — D631 Anemia in chronic kidney disease: Secondary | ICD-10-CM | POA: Diagnosis not present

## 2022-01-21 DIAGNOSIS — D509 Iron deficiency anemia, unspecified: Secondary | ICD-10-CM | POA: Diagnosis not present

## 2022-01-21 DIAGNOSIS — Z992 Dependence on renal dialysis: Secondary | ICD-10-CM | POA: Diagnosis not present

## 2022-01-21 DIAGNOSIS — N186 End stage renal disease: Secondary | ICD-10-CM | POA: Diagnosis not present

## 2022-01-23 DIAGNOSIS — N2581 Secondary hyperparathyroidism of renal origin: Secondary | ICD-10-CM | POA: Diagnosis not present

## 2022-01-23 DIAGNOSIS — D631 Anemia in chronic kidney disease: Secondary | ICD-10-CM | POA: Diagnosis not present

## 2022-01-23 DIAGNOSIS — D509 Iron deficiency anemia, unspecified: Secondary | ICD-10-CM | POA: Diagnosis not present

## 2022-01-23 DIAGNOSIS — Z992 Dependence on renal dialysis: Secondary | ICD-10-CM | POA: Diagnosis not present

## 2022-01-23 DIAGNOSIS — N186 End stage renal disease: Secondary | ICD-10-CM | POA: Diagnosis not present

## 2022-01-26 DIAGNOSIS — D631 Anemia in chronic kidney disease: Secondary | ICD-10-CM | POA: Diagnosis not present

## 2022-01-26 DIAGNOSIS — N186 End stage renal disease: Secondary | ICD-10-CM | POA: Diagnosis not present

## 2022-01-26 DIAGNOSIS — N2581 Secondary hyperparathyroidism of renal origin: Secondary | ICD-10-CM | POA: Diagnosis not present

## 2022-01-26 DIAGNOSIS — Z992 Dependence on renal dialysis: Secondary | ICD-10-CM | POA: Diagnosis not present

## 2022-01-26 DIAGNOSIS — D509 Iron deficiency anemia, unspecified: Secondary | ICD-10-CM | POA: Diagnosis not present

## 2022-01-28 DIAGNOSIS — D509 Iron deficiency anemia, unspecified: Secondary | ICD-10-CM | POA: Diagnosis not present

## 2022-01-28 DIAGNOSIS — D631 Anemia in chronic kidney disease: Secondary | ICD-10-CM | POA: Diagnosis not present

## 2022-01-28 DIAGNOSIS — N186 End stage renal disease: Secondary | ICD-10-CM | POA: Diagnosis not present

## 2022-01-28 DIAGNOSIS — Z992 Dependence on renal dialysis: Secondary | ICD-10-CM | POA: Diagnosis not present

## 2022-01-28 DIAGNOSIS — N2581 Secondary hyperparathyroidism of renal origin: Secondary | ICD-10-CM | POA: Diagnosis not present

## 2022-01-30 DIAGNOSIS — D631 Anemia in chronic kidney disease: Secondary | ICD-10-CM | POA: Diagnosis not present

## 2022-01-30 DIAGNOSIS — D509 Iron deficiency anemia, unspecified: Secondary | ICD-10-CM | POA: Diagnosis not present

## 2022-01-30 DIAGNOSIS — Z992 Dependence on renal dialysis: Secondary | ICD-10-CM | POA: Diagnosis not present

## 2022-01-30 DIAGNOSIS — N2581 Secondary hyperparathyroidism of renal origin: Secondary | ICD-10-CM | POA: Diagnosis not present

## 2022-01-30 DIAGNOSIS — N186 End stage renal disease: Secondary | ICD-10-CM | POA: Diagnosis not present

## 2022-02-02 DIAGNOSIS — N186 End stage renal disease: Secondary | ICD-10-CM | POA: Diagnosis not present

## 2022-02-02 DIAGNOSIS — I129 Hypertensive chronic kidney disease with stage 1 through stage 4 chronic kidney disease, or unspecified chronic kidney disease: Secondary | ICD-10-CM | POA: Diagnosis not present

## 2022-02-02 DIAGNOSIS — D509 Iron deficiency anemia, unspecified: Secondary | ICD-10-CM | POA: Diagnosis not present

## 2022-02-02 DIAGNOSIS — N2581 Secondary hyperparathyroidism of renal origin: Secondary | ICD-10-CM | POA: Diagnosis not present

## 2022-02-02 DIAGNOSIS — D631 Anemia in chronic kidney disease: Secondary | ICD-10-CM | POA: Diagnosis not present

## 2022-02-02 DIAGNOSIS — Z992 Dependence on renal dialysis: Secondary | ICD-10-CM | POA: Diagnosis not present

## 2022-02-04 DIAGNOSIS — D509 Iron deficiency anemia, unspecified: Secondary | ICD-10-CM | POA: Diagnosis not present

## 2022-02-04 DIAGNOSIS — E875 Hyperkalemia: Secondary | ICD-10-CM | POA: Diagnosis not present

## 2022-02-04 DIAGNOSIS — N2581 Secondary hyperparathyroidism of renal origin: Secondary | ICD-10-CM | POA: Diagnosis not present

## 2022-02-04 DIAGNOSIS — Z992 Dependence on renal dialysis: Secondary | ICD-10-CM | POA: Diagnosis not present

## 2022-02-04 DIAGNOSIS — D631 Anemia in chronic kidney disease: Secondary | ICD-10-CM | POA: Diagnosis not present

## 2022-02-04 DIAGNOSIS — N186 End stage renal disease: Secondary | ICD-10-CM | POA: Diagnosis not present

## 2022-02-06 DIAGNOSIS — Z992 Dependence on renal dialysis: Secondary | ICD-10-CM | POA: Diagnosis not present

## 2022-02-06 DIAGNOSIS — D631 Anemia in chronic kidney disease: Secondary | ICD-10-CM | POA: Diagnosis not present

## 2022-02-06 DIAGNOSIS — N186 End stage renal disease: Secondary | ICD-10-CM | POA: Diagnosis not present

## 2022-02-06 DIAGNOSIS — E875 Hyperkalemia: Secondary | ICD-10-CM | POA: Diagnosis not present

## 2022-02-06 DIAGNOSIS — D509 Iron deficiency anemia, unspecified: Secondary | ICD-10-CM | POA: Diagnosis not present

## 2022-02-06 DIAGNOSIS — N2581 Secondary hyperparathyroidism of renal origin: Secondary | ICD-10-CM | POA: Diagnosis not present

## 2022-02-09 DIAGNOSIS — D631 Anemia in chronic kidney disease: Secondary | ICD-10-CM | POA: Diagnosis not present

## 2022-02-09 DIAGNOSIS — N186 End stage renal disease: Secondary | ICD-10-CM | POA: Diagnosis not present

## 2022-02-09 DIAGNOSIS — E875 Hyperkalemia: Secondary | ICD-10-CM | POA: Diagnosis not present

## 2022-02-09 DIAGNOSIS — D509 Iron deficiency anemia, unspecified: Secondary | ICD-10-CM | POA: Diagnosis not present

## 2022-02-09 DIAGNOSIS — N2581 Secondary hyperparathyroidism of renal origin: Secondary | ICD-10-CM | POA: Diagnosis not present

## 2022-02-09 DIAGNOSIS — Z992 Dependence on renal dialysis: Secondary | ICD-10-CM | POA: Diagnosis not present

## 2022-02-11 ENCOUNTER — Other Ambulatory Visit: Payer: Self-pay

## 2022-02-11 DIAGNOSIS — D509 Iron deficiency anemia, unspecified: Secondary | ICD-10-CM | POA: Diagnosis not present

## 2022-02-11 DIAGNOSIS — Z992 Dependence on renal dialysis: Secondary | ICD-10-CM | POA: Diagnosis not present

## 2022-02-11 DIAGNOSIS — E875 Hyperkalemia: Secondary | ICD-10-CM | POA: Diagnosis not present

## 2022-02-11 DIAGNOSIS — N186 End stage renal disease: Secondary | ICD-10-CM | POA: Diagnosis not present

## 2022-02-11 DIAGNOSIS — D631 Anemia in chronic kidney disease: Secondary | ICD-10-CM | POA: Diagnosis not present

## 2022-02-11 DIAGNOSIS — N2581 Secondary hyperparathyroidism of renal origin: Secondary | ICD-10-CM | POA: Diagnosis not present

## 2022-02-13 DIAGNOSIS — N2581 Secondary hyperparathyroidism of renal origin: Secondary | ICD-10-CM | POA: Diagnosis not present

## 2022-02-13 DIAGNOSIS — Z992 Dependence on renal dialysis: Secondary | ICD-10-CM | POA: Diagnosis not present

## 2022-02-13 DIAGNOSIS — N186 End stage renal disease: Secondary | ICD-10-CM | POA: Diagnosis not present

## 2022-02-13 DIAGNOSIS — D509 Iron deficiency anemia, unspecified: Secondary | ICD-10-CM | POA: Diagnosis not present

## 2022-02-13 DIAGNOSIS — D631 Anemia in chronic kidney disease: Secondary | ICD-10-CM | POA: Diagnosis not present

## 2022-02-13 DIAGNOSIS — E875 Hyperkalemia: Secondary | ICD-10-CM | POA: Diagnosis not present

## 2022-02-15 ENCOUNTER — Encounter: Payer: Self-pay | Admitting: Internal Medicine

## 2022-02-15 ENCOUNTER — Other Ambulatory Visit: Payer: Self-pay

## 2022-02-15 ENCOUNTER — Telehealth (HOSPITAL_COMMUNITY): Payer: Self-pay

## 2022-02-15 ENCOUNTER — Encounter: Payer: Self-pay | Admitting: Gastroenterology

## 2022-02-15 ENCOUNTER — Ambulatory Visit: Payer: Medicare Other | Attending: Internal Medicine | Admitting: Internal Medicine

## 2022-02-15 VITALS — BP 140/80 | HR 90 | Resp 18 | Ht 67.0 in | Wt 273.0 lb

## 2022-02-15 DIAGNOSIS — Z860100 Personal history of colon polyps, unspecified: Secondary | ICD-10-CM

## 2022-02-15 DIAGNOSIS — Z23 Encounter for immunization: Secondary | ICD-10-CM

## 2022-02-15 DIAGNOSIS — K644 Residual hemorrhoidal skin tags: Secondary | ICD-10-CM | POA: Diagnosis not present

## 2022-02-15 DIAGNOSIS — Z6841 Body Mass Index (BMI) 40.0 and over, adult: Secondary | ICD-10-CM

## 2022-02-15 DIAGNOSIS — Z0000 Encounter for general adult medical examination without abnormal findings: Secondary | ICD-10-CM

## 2022-02-15 DIAGNOSIS — I10 Essential (primary) hypertension: Secondary | ICD-10-CM

## 2022-02-15 DIAGNOSIS — Z0001 Encounter for general adult medical examination with abnormal findings: Secondary | ICD-10-CM | POA: Diagnosis not present

## 2022-02-15 DIAGNOSIS — K5903 Drug induced constipation: Secondary | ICD-10-CM | POA: Diagnosis not present

## 2022-02-15 DIAGNOSIS — H00011 Hordeolum externum right upper eyelid: Secondary | ICD-10-CM

## 2022-02-15 DIAGNOSIS — Z1211 Encounter for screening for malignant neoplasm of colon: Secondary | ICD-10-CM

## 2022-02-15 MED ORDER — DOCUSATE SODIUM 100 MG PO CAPS
100.0000 mg | ORAL_CAPSULE | Freq: Two times a day (BID) | ORAL | 4 refills | Status: DC
  Filled 2022-02-15: qty 60, 30d supply, fill #0

## 2022-02-15 NOTE — Progress Notes (Incomplete)
PCP: [name], MD Cardiology: Dr. Radford Pax HF cardiology: Dr. Aundra Dubin  61 y.o. with history of CAD, ischemic CMP, and ESRD was referred by Dr. Radford Pax for evaluation of CHF.  He had PCI in 0865, uncertain what vessel was involved.  He has not had coronary angiography since that time due to advanced CKD.  He has had ischemic cardiomyopathy x years.  Echo in 2/22 showed EF 25-30% with moderate LV dilation and normal RV.  He had VT arrest in 2015, has St Jude subcutaneous ICD.  [name] in 2/22.  LHC/RHC was done in 8/22, showing no significant CAD and stable hemodynamics.   Echo was done today and reviewed, EF 25-30%, moderate LV dilation, diffuse hypokinesis, normal RV.   He returns for followup of CHF.  He is tolerating dialysis reasonably well.  He holds his cardiac meds the morning of dialysis and still gets hypotension at times.  No significant exertional dyspnea or chest pain.  He is working full times as a Chief Strategy Officer, does a lot of physical labor with no problems.  No lightheadedness.  Weight stable.    ECG (personally reviewed): NSR, IVCD 138 msec  Labs (6/22): LDL 75 Labs (8/22): hgb 13.9  PMH: 1. CAD: PCI in 7846, uncertain what vessel.  - Cardiolite 2017: Inferior and inferolateral large fixed defect suggestive of prior infarction, EF 28%.  - LHC (8/22): No significant CAD.  2. Chronic systolic CHF: Ischemic cardiomyopathy. St Jude subcutaneous ICD.  - Echo (7/20) with EF 25-30%.  - Echo (10/20) with EF 40-45% - Echo (2/22): EF 25-30%, moderate LV dilation, mild LVH, normal RV size and systolic function, mild MR.  - LHC/RHC (8/22): mean RA 2, PA 24/3, mean PCWP 9, CI 3.13; no significant coronary disease.  - Echo (11/22): EF 25-30%, moderate LV dilation, diffuse hypokinesis, normal RV. 3. ESRD since 2/22 4. OSA on CPAP 5. H/o VT arrest: Subcutaneous St Jude ICD [facility] in [facility] at [facility].  6. COVID-19 1/22.  7. HTN 8. Hyperlipidemia 9. Recurrent angioedema.    Social History   Socioeconomic History   Marital status: Single    Spouse name: Not on file   Number of children: Not on file   Years of education: Not on file   Highest education level: Not on file  Occupational History   Not on file  Tobacco Use   Smoking status: Never   Smokeless tobacco: Never  Vaping Use   Vaping Use: Never used  Substance and Sexual Activity   Alcohol use: No   Drug use: No   Sexual activity: Yes  Other Topics Concern   Not on file  Social History Narrative   ** Merged History Encounter **       Social Determinants of Health   Financial Resource Strain: Not on file  Food Insecurity: Not on file  Transportation Needs: Not on file  Physical Activity: Not on file  Stress: Not on file  Social Connections: Not on file  Intimate Partner Violence: Not on file   Family History  Problem Relation Age of Onset   Hypertension Mother    Liver disease Father    ROS: All systems reviewed and negative except as per HPI.   Current Outpatient Medications  Medication Sig Dispense Refill   albuterol (PROVENTIL) (2.5 MG/3ML) 0.083% nebulizer solution TAKE 3 MLS (2.5 MG TOTAL) BY NEBULIZATION EVERY 6 (SIX) HOURS AS NEEDED FOR WHEEZING OR SHORTNESS OF BREATH. 75 mL 12   albuterol (VENTOLIN HFA)  108 (90 Base) MCG/ACT inhaler Inhale 2 puffs into the lungs every 4 (four) hours as needed for wheezing or shortness of breath. 8 g 6   aspirin EC 81 MG tablet Take 1 tablet (81 mg total) by mouth daily. 30 tablet 5   atorvastatin (LIPITOR) 40 MG tablet TAKE 1 TABLET (40 MG TOTAL) BY MOUTH AT BEDTIME. 30 tablet 2   AURYXIA 1 GM 210 MG(Fe) tablet Take 1 tablet (210 mg total) by mouth 3 (three) times daily. 270 tablet 1   budesonide-formoterol (SYMBICORT) 80-4.5 MCG/ACT inhaler Inhale 2 puffs into the lungs 2 (two) times daily. 1 each 6   calcium acetate (PHOSLO) 667 MG capsule Take 2 capsule by mouth three times a day with meals 180 capsule 6   carvedilol (COREG) 25 MG  tablet TAKE 1 TABLET (25 MG TOTAL) BY MOUTH 2 (TWO) TIMES DAILY WITH A MEAL. 180 tablet 1   cetirizine (ZYRTEC) 10 MG tablet Take 1 tablet (10 mg total) by mouth 2 (two) times daily. 60 tablet 5   Cholecalciferol (VITAMIN D-3) 125 MCG (5000 UT) TABS Take 5,000 Units by mouth daily.     EPINEPHrine 0.3 mg/0.3 mL IJ SOAJ injection Inject 0.3 mg into the muscle once as needed for up to 2 doses (if worsening tongue swelling, SOB, hypoxia, or other concerns for progressive anaphylaxis). 2 each 2   ethyl chloride spray SPRAY A SMALL AMOUNT THREE TIMES A WEEK JUST PRIOR TO NEEDLE INSERTION 116 mL 11   famotidine (PEPCID) 20 MG tablet Take 1 tablet (20 mg total) by mouth 2 (two) times daily. 60 tablet 5   fluticasone (FLONASE) 50 MCG/ACT nasal spray Place into both nostrils as needed for allergies or rhinitis.     hydrALAZINE (APRESOLINE) 25 MG tablet Take 3 tablets (75 mg total) by mouth 3 (three) times daily. 270 tablet 11   isosorbide mononitrate (IMDUR) 60 MG 24 hr tablet Take 1 tablet (60 mg total) by mouth daily. 90 tablet 3   loratadine (CLARITIN) 10 MG tablet Take 10 mg by mouth daily as needed for allergies or rhinitis.     Methoxy PEG-Epoetin Beta (MIRCERA IJ)      multivitamin (RENA-VIT) TABS tablet TAKE 1 TABLET BY MOUTH ONCE DAILY 30 tablet 11   sildenafil (VIAGRA) 50 MG tablet Take 1 tablet by mouth once a day as needed 30 min prior to sexual activity. 30 tablet 0   No current facility-administered medications for this visit.   There were no vitals taken for this visit. General: NAD, obese.  Neck: No JVD, no thyromegaly or thyroid nodule.  Lungs: Clear to auscultation bilaterally with normal respiratory effort. CV: Nondisplaced PMI.  Heart regular S1/S2, no S3/S4, no murmur.  No peripheral edema.  No carotid bruit.  Normal pedal pulses.  Abdomen: Soft, nontender, no hepatosplenomegaly, no distention.  Skin: Intact without lesions or rashes.  Neurologic: Alert and oriented x 3.  Psych:  Normal affect. Extremities: No clubbing or cyanosis.  HEENT: Normal.    Assessment/Plan: 1. CAD: PCI in 2005, unsure what vessel.  Coronary angiography in 8/22 showed no significant CAD.    - Continue atorvastatin, lipids ok in 6/22.  - Continue ASA 81 daily.  2. ESRD: Since 2/22.  Turned down for renal transplant due to low EF.  3. Chronic systolic CHF: Ischemic cardiomyopathy, echo in 2/22 with EF 25-30%, moderate LV dilation, mild LVH, normal RV size and systolic function, mild MR.   He has a Research officer, political party subcutaneous ICD.  Echo today with EF 25-30%, moderate LV dilation, normal RV.  He is not volume overloaded on exam.  Medication titration limited by ESRD, need to avoid hypotension at HD.  - Continue Coreg 25 mg bid.  - Continue hydralazine 75 mg tid and Imdur to 60 mg daily.  He knows to avoid taking sildenafil if he has used Imdur (needs to be off Imdur for > 24 hrs if he uses).  - ECG with IVCD 138 msec, thought not to be good candidate for CRT upgrade (QRS < 150 msec, not true LBBB).  4. OSA: Continue CPAP.  5. Angioedema: Recurrent episodes.  He is not on a med that commonly causes angioedema.    Followup in 4 months with APP.   [place] 02/15/2022

## 2022-02-15 NOTE — Patient Instructions (Signed)
Please remember to take your blood pressure medications when you return home. ?I have referred you back to Dr. Havery Moros for your colonoscopy given history of precancerous colon polyps. ?I have sent a prescription to the pharmacy for the stool softener called Colace for you to take daily.  Sit in warm water at least once or twice a week to help sooth the hemorrhoids. ? ?I have referred you to the nutritionist as was discussed today. ? ?Healthy Eating ?Following a healthy eating pattern may help you to achieve and maintain a healthy body weight, reduce the risk of chronic disease, and live a long and productive life. It is important to follow a healthy eating pattern at an appropriate calorie level for your body. Your nutritional needs should be met primarily through food by choosing a variety of nutrient-rich foods. ?What are tips for following this plan? ?Reading food labels ?Read labels and choose the following: ?Reduced or low sodium. ?Juices with 100% fruit juice. ?Foods with low saturated fats and high polyunsaturated and monounsaturated fats. ?Foods with whole grains, such as whole wheat, cracked wheat, [place] rice, and wild rice. ?Whole grains that are fortified with folic acid. This is recommended for women who are pregnant or who want to become pregnant. ?Read labels and avoid the following: ?Foods with a lot of added sugars. These include foods that contain [place] sugar, corn sweetener, corn syrup, dextrose, fructose, glucose, high-fructose corn syrup, honey, invert sugar, lactose, malt syrup, maltose, molasses, raw sugar, sucrose, trehalose, or turbinado sugar. ?Do not eat more than the following amounts of added sugar per day: ?6 teaspoons (25 g) for women. ?9 teaspoons (38 g) for men. ?Foods that contain processed or refined starches and grains. ?Refined grain products, such as white flour, degermed cornmeal, white bread, and white rice. ?Shopping ?Choose nutrient-rich snacks, such as vegetables, whole  fruits, and nuts. Avoid high-calorie and high-sugar snacks, such as potato chips, fruit snacks, and candy. ?Use oil-based dressings and spreads on foods instead of solid fats such as butter, stick margarine, or cream cheese. ?Limit pre-made sauces, mixes, and "instant" products such as flavored rice, instant noodles, and ready-made pasta. ?Try more plant-protein sources, such as tofu, tempeh, black beans, edamame, lentils, nuts, and seeds. ?Explore eating plans such as the Mediterranean diet or vegetarian diet. ?Cooking ?Use oil to saut? or stir-fry foods instead of solid fats such as butter, stick margarine, or lard. ?Try baking, boiling, grilling, or broiling instead of frying. ?Remove the fatty part of meats before cooking. ?Steam vegetables in water or broth. ?Meal planning ? ?At meals, imagine dividing your plate into fourths: ?One-half of your plate is fruits and vegetables. ?One-fourth of your plate is whole grains. ?One-fourth of your plate is protein, especially lean meats, poultry, eggs, tofu, beans, or nuts. ?Include low-fat dairy as part of your daily diet. ?Lifestyle ?Choose healthy options in all settings, including home, work, school, restaurants, or stores. ?Prepare your food safely: ?Wash your hands after handling raw meats. ?Keep food preparation surfaces clean by regularly washing with hot, soapy water. ?Keep raw meats separate from ready-to-eat foods, such as fruits and vegetables. ?Cook seafood, meat, poultry, and eggs to the recommended internal temperature. ?Store foods at safe temperatures. In general: ?Keep cold foods at 40?F (4.4?C) or below. ?Keep hot foods at 140?F (60?C) or above. ?Keep your freezer at 0?F (-17.8?C) or below. ?Foods are no longer safe to eat when they have been between the temperatures of 40?-140?F (4.4-60?C) for more than 2 hours. ?What  foods should I eat? ?Fruits ?Aim to eat 2 cup-equivalents of fresh, canned (in natural juice), or frozen fruits each day. Examples of 1  cup-equivalent of fruit include 1 small apple, 8 large strawberries, 1 cup canned fruit, ? cup dried fruit, or 1 cup 100% juice. ?Vegetables ?Aim to eat 2?-3 cup-equivalents of fresh and frozen vegetables each day, including different varieties and colors. Examples of 1 cup-equivalent of vegetables include 2 medium carrots, 2 cups raw, leafy greens, 1 cup chopped vegetable (raw or cooked), or 1 medium baked potato. ?Grains ?Aim to eat 6 ounce-equivalents of whole grains each day. Examples of 1 ounce-equivalent of grains include 1 slice of bread, 1 cup ready-to-eat cereal, 3 cups popcorn, or ? cup cooked rice, pasta, or cereal. ?Meats and other proteins ?Aim to eat 5-6 ounce-equivalents of protein each day. Examples of 1 ounce-equivalent of protein include 1 egg, 1/2 cup nuts or seeds, or 1 tablespoon (16 g) peanut butter. A cut of meat or fish that is the size of a deck of cards is about 3-4 ounce-equivalents. ?Of the protein you eat each week, try to have at least 8 ounces come from seafood. This includes salmon, trout, herring, and anchovies. ?Dairy ?Aim to eat 3 cup-equivalents of fat-free or low-fat dairy each day. Examples of 1 cup-equivalent of dairy include 1 cup (240 mL) milk, 8 ounces (250 g) yogurt, 1? ounces (44 g) natural cheese, or 1 cup (240 mL) fortified soy milk. ?Fats and oils ?Aim for about 5 teaspoons (21 g) per day. Choose monounsaturated fats, such as canola and olive oils, avocados, peanut butter, and most nuts, or polyunsaturated fats, such as sunflower, corn, and soybean oils, walnuts, pine nuts, sesame seeds, sunflower seeds, and flaxseed. ?Beverages ?Aim for six 8-oz glasses of water per day. Limit coffee to three to five 8-oz cups per day. ?Limit caffeinated beverages that have added calories, such as soda and energy drinks. ?Limit alcohol intake to no more than 1 drink a day for nonpregnant women and 2 drinks a day for men. One drink equals 12 oz of beer (355 mL), 5 oz of wine (148 mL),  or 1? oz of hard liquor (44 mL). ?Seasoning and other foods ?Avoid adding excess amounts of salt to your foods. Try flavoring foods with herbs and spices instead of salt. ?Avoid adding sugar to foods. ?Try using oil-based dressings, sauces, and spreads instead of solid fats. ?This information is based on general [place] nutrition guidelines. For more information, visit Build[employer].es. Exact amounts may vary based on your nutrition needs. ?Summary ?A healthy eating plan may help you to maintain a healthy weight, reduce the risk of chronic diseases, and stay active throughout your life. ?Plan your meals. Make sure you eat the right portions of a variety of nutrient-rich foods. ?Try baking, boiling, grilling, or broiling instead of frying. ?Choose healthy options in all settings, including home, work, school, restaurants, or stores. ?This information is not intended to replace advice given to you by your health care provider. Make sure you discuss any questions you have with your health care provider. ?Document Revised: 07/21/2021 Document Reviewed: 07/21/2021 ?Elsevier Patient Education ? 2022 [employer]. ? ? ? ?

## 2022-02-15 NOTE — Telephone Encounter (Signed)
Called to confirm/remind patient of their appointment at the [facility] on 02/16/22.  ? ?Patient reminded to bring all medications and/or complete list. ? ?Confirmed patient has transportation. Gave directions, instructed to utilize [place] parking. ? ?Confirmed appointment prior to ending call.  ? ?

## 2022-02-15 NOTE — Progress Notes (Signed)
Subjective:   Victor Thompson is a 61 y.o. male who presents for a Welcome to Medicare exam.  Pt with hx of ESRD on hemodialysis (Dr. Joelyn Thompson), HTN, CAD (with hx of cardiac arrest), ICD, combined sys/dia CHF (EF 25-30 % 01/2021), HL, OSA on CPAP, Obese, COPD, chronic lower back pain with multilevel spondylosis.   Review of Systems: GI:  reports constipation when he takes his binders.  Takes Ducolax once every 2 wks. Thinks he has hemorrhoids which are now hanging outside the rectum.  Some rectal bleeding with bowel movements but not often.  Using Preparation H OTC.  Patient had colonoscopy in 01/2019 that revealed internal hemorrhoids.  Several polyps removed.  He is due for repeat colonoscopy with Dr. Havery Thompson. Eye:  knot on upper RT eye lid x 1-2 wks CV:  BP elevated.  Did not take meds as yet for the a.m        Objective:    Today's Vitals   02/15/22 1017  BP: 140/80  Pulse: 90  Resp: 18  SpO2: 95%  Weight: 273 lb (123.8 kg)  Height: '5\' 7"'$  (1.702 m)  PainSc: 6    Body mass index is 42.76 kg/m. Wt Readings from Last 3 Encounters:  02/15/22 273 lb (123.8 kg)  12/03/21 269 lb 12.8 oz (122.4 kg)  11/12/21 268 lb 9.6 oz (121.8 kg)   General: Middle to older African-American male obese in NAD: Eyes: Small stye on the medial aspect of the right upper eyelid.  No conjunctival injection. Neck: No cervical lymphadenopathy.  No thyromegaly. Chest: Clear to auscultation bilaterally CVS: Regular rate rhythm.  Soft systolic ejection murmur heard along the left sternal border. Extremity: No lower extremity edema. Rectal: CMA Victor Thompson present as chaperone: He has several noninflamed hemorrhoids and rectal tags. Medications Outpatient Encounter Medications as of 02/15/2022  Medication Sig   albuterol (PROVENTIL) (2.5 MG/3ML) 0.083% nebulizer solution TAKE 3 MLS (2.5 MG TOTAL) BY NEBULIZATION EVERY 6 (SIX) HOURS AS NEEDED FOR WHEEZING OR SHORTNESS OF BREATH.   albuterol  (VENTOLIN HFA) 108 (90 Base) MCG/ACT inhaler Inhale 2 puffs into the lungs every 4 (four) hours as needed for wheezing or shortness of breath.   aspirin EC 81 MG tablet Take 1 tablet (81 mg total) by mouth daily.   atorvastatin (LIPITOR) 40 MG tablet TAKE 1 TABLET (40 MG TOTAL) BY MOUTH AT BEDTIME.   AURYXIA 1 GM 210 MG(Fe) tablet Take 1 tablet (210 mg total) by mouth 3 (three) times daily.   budesonide-formoterol (SYMBICORT) 80-4.5 MCG/ACT inhaler Inhale 2 puffs into the lungs 2 (two) times daily.   calcium acetate (PHOSLO) 667 MG capsule Take 2 capsule by mouth three times a day with meals   carvedilol (COREG) 25 MG tablet TAKE 1 TABLET (25 MG TOTAL) BY MOUTH 2 (TWO) TIMES DAILY WITH A MEAL.   cetirizine (ZYRTEC) 10 MG tablet Take 1 tablet (10 mg total) by mouth 2 (two) times daily.   Cholecalciferol (VITAMIN D-3) 125 MCG (5000 UT) TABS Take 5,000 Units by mouth daily.   docusate sodium (COLACE) 100 MG capsule Take 1 capsule (100 mg total) by mouth 2 (two) times daily.   EPINEPHrine 0.3 mg/0.3 mL IJ SOAJ injection Inject 0.3 mg into the muscle once as needed for up to 2 doses (if worsening tongue swelling, SOB, hypoxia, or other concerns for progressive anaphylaxis).   ethyl chloride spray SPRAY A SMALL AMOUNT THREE TIMES A WEEK JUST PRIOR TO NEEDLE INSERTION   famotidine (PEPCID)  20 MG tablet Take 1 tablet (20 mg total) by mouth 2 (two) times daily.   fluticasone (FLONASE) 50 MCG/ACT nasal spray Place into both nostrils as needed for allergies or rhinitis.   hydrALAZINE (APRESOLINE) 25 MG tablet Take 3 tablets (75 mg total) by mouth 3 (three) times daily.   isosorbide mononitrate (IMDUR) 60 MG 24 hr tablet Take 1 tablet (60 mg total) by mouth daily.   loratadine (CLARITIN) 10 MG tablet Take 10 mg by mouth daily as needed for allergies or rhinitis.   Methoxy PEG-Epoetin Beta (MIRCERA IJ)    multivitamin (RENA-VIT) TABS tablet TAKE 1 TABLET BY MOUTH ONCE DAILY   sildenafil (VIAGRA) 50 MG tablet  Take 1 tablet by mouth once a day as needed 30 min prior to sexual activity.   No facility-administered encounter medications on file as of 02/15/2022.     History: Past Medical History:  Diagnosis Date   AICD (automatic cardioverter/defibrillator) present    2015   Asthma    no meds   Cardiac arrest ([place]) 2015   CHF (congestive heart failure) (HCC)    Chronic kidney disease    ckd -stage 5   Coronary artery disease    Hypertension    Myocardial infarction ([place])    Obesity    Pneumonia    Pulmonary edema    Shortness of breath dyspnea    Sleep apnea    USES CPAP   Wears glasses    Past Surgical History:  Procedure Laterality Date   AV FISTULA PLACEMENT Right 03/15/2019   Procedure: RIGHT ARM ARTERIOVENOUS (AV) FISTULA CREATION;  Surgeon: Victor Mould, MD;  Location: [facility];  Service: Vascular;  Laterality: Right;   COLONOSCOPY     COLONOSCOPY W/ BIOPSIES AND POLYPECTOMY     CORONARY STENT PLACEMENT  2007   IMPLANTABLE CARDIOVERTER DEFIBRILLATOR IMPLANT  2015   RIGHT/LEFT HEART CATH AND CORONARY ANGIOGRAPHY N/A 07/08/2021   Procedure: RIGHT/LEFT HEART CATH AND CORONARY ANGIOGRAPHY;  Surgeon: Victor Dresser, MD;  Location: [facility];  Service: Cardiovascular;  Laterality: N/A;   SUBQ ICD CHANGEOUT N/A 10/13/2020   Procedure: SUBQ ICD CHANGEOUT;  Surgeon: Victor Lance, MD;  Location: [facility];  Service: Cardiovascular;  Laterality: N/A;    Family History  Problem Relation Age of Onset   Hypertension Mother    Liver disease Father    Social History   Occupational History   Not on file  Tobacco Use   Smoking status: Never   Smokeless tobacco: Never  Vaping Use   Vaping Use: Never used  Substance and Sexual Activity   Alcohol use: No   Drug use: No   Sexual activity: Yes   Tobacco Counseling Counseling given: Not Answered   Immunizations and Health Maintenance Immunization History  Administered Date(s) Administered   Influenza  Whole 11/08/2007, 09/10/2008   PFIZER(Purple Top)SARS-COV-2 Vaccination 02/27/2020, 03/23/2020   PPD Test 09/01/2016   Td 12/06/2004   Tdap 03/15/2016   Health Maintenance Due  Topic Date Due   Zoster Vaccines- Shingrix (1 of 2) Never done   COVID-19 Vaccine (3 - Pfizer risk series) 04/20/2020   COLONOSCOPY (Pts 45-67yr Insurance coverage will need to be confirmed)  02/01/2022    Activities of Daily Living In your present state of health, do you have any difficulty performing the following activities: 02/15/2022  Hearing? N  Vision? N  Difficulty concentrating or making decisions? N  Walking or climbing stairs? N  Dressing or  bathing? N  Doing errands, shopping? N  Preparing Food and eating ? N  Using the Toilet? N  In the past six months, have you accidently leaked urine? N  Do you have problems with loss of bowel control? N  Managing your Medications? N  Managing your Finances? N  Housekeeping or managing your Housekeeping? N  Some recent data might be hidden    Physical Exam  (optional), or other factors deemed appropriate based on the beneficiary's medical and social history and current clinical standards.  Advanced Directives:      Assessment:    This is a routine wellness  examination for this patient .   Vision/Hearing screen -Reports having had eye exam at [facility] 1 mth and got new glasses for distance.  Screening vision today is 20/25 bilaterally without corrective lenses. -Whisper test normal.  Dietary issues and exercise activities discussed:  -feels he needs to eat smaller portions.  Drinks mainly water -Reports he puts down floors and hang sheet rock 1-2 x/wk   Goals      Blood Pressure < 140/90     Weight (lb) < 200 lb (90.7 kg)     Patient has set a goal to lose 20 pounds by the end of this year.        Depression Screen PHQ 2/9 Scores 02/15/2022 10/02/2021 08/25/2021 02/06/2021  PHQ - 2 Score 0 - 0 0  PHQ- 9 Score - - - -  Exception  Documentation - Patient refusal - -     Fall Risk Fall Risk  02/15/2022  Falls in the past year? 0  Number falls in past yr: 0  Injury with Fall? 0  Risk for fall due to : No Fall Risks    Cognitive Function MMSE - Mini Mental State Exam 02/15/2022  Orientation to time 5  Orientation to Place 5  Registration 3  Attention/ Calculation 5  Recall 2  Language- name 2 objects 2  Language- repeat 1  Language- follow 3 step command 3  Language- read & follow direction 1  Write a sentence 1  Copy design 0  Total score 28        Patient Care Team: [name], MD as PCP - General (Internal Medicine) [name], MD as PCP - Electrophysiology (Cardiology) Dixie Dials, MD as Consulting Physician (Cardiology) [name], MD as Consulting Physician (Cardiology)     Plan:    1. Encounter for Medicare annual wellness exam Patient declines shingles vaccine. He reports having had 2 COVID-19 vaccines.  I have encouraged him to get the new booster shot.  2. Drug-induced constipation Discussed the importance of keeping bowel movements soft and regular.  Prescription given for Colace to take twice a day. - docusate sodium (COLACE) 100 MG capsule; Take 1 capsule (100 mg total) by mouth 2 (two) times daily.  Dispense: 60 capsule; Refill: 4  3. Hemorrhoids, external without complications Discussed the importance of keeping bowel movements soft and regular.  Start Colace.  Recommend increase fiber in the diet.   Sitz bath recommended twice a week.  He declines prescription for Anusol [facility] as he reports good results with Preparation H from over-the-counter.  4. Hordeolum externum of right upper eyelid Recommend warm compresses 2-3 times a day for 10 minutes  5. Essential hypertension Not at goal.  Patient has not taken his medicines as yet for the morning and will do so once he returns home  6. Morbid obesity ([place]) Patient agreeable  to seeing a nutritionist for dietary  counseling and to help him achieve his weight goal.  Recommend and encourage him to try walking several days a week for 15 to 20 minutes. - Amb ref to Medical Nutrition Therapy-MNT  7. Screening for colon cancer He is due for repeat colon cancer screening.  Referral submitted. - Ambulatory referral to Gastroenterology  8. COVID-19 vaccine series started Encouraged him to get the new COVID 19 vaccine booster.  9. History of colon polyps - Ambulatory referral to Gastroenterology  I have personally reviewed and noted the following in the patients chart:   Medical and social history Use of alcohol, tobacco or illicit drugs  Current medications and supplements Functional ability and status Nutritional status Physical activity Advanced directives List of other physicians Hospitalizations, surgeries, and ER visits in previous 12 months Vitals Screenings to include cognitive, depression, and falls Referrals and appointments  In addition, I have reviewed and discussed with patient certain preventive protocols, quality metrics, and best practice recommendations. A written personalized care plan for preventive services as well as general preventive health recommendations were provided to patient.    Karle Plumber, MD 02/15/2022

## 2022-02-16 ENCOUNTER — Encounter (HOSPITAL_COMMUNITY): Payer: Self-pay

## 2022-02-16 ENCOUNTER — Ambulatory Visit (HOSPITAL_COMMUNITY)
Admission: RE | Admit: 2022-02-16 | Discharge: 2022-02-16 | Disposition: A | Discharge status: Home / Self Care | Payer: Medicare Other | Source: Ambulatory Visit | Attending: Family Medicine | Admitting: Family Medicine

## 2022-02-16 VITALS — BP 122/80 | HR 94 | Wt 268.2 lb

## 2022-02-16 DIAGNOSIS — Z6841 Body Mass Index (BMI) 40.0 and over, adult: Secondary | ICD-10-CM | POA: Insufficient documentation

## 2022-02-16 DIAGNOSIS — G4733 Obstructive sleep apnea (adult) (pediatric): Secondary | ICD-10-CM | POA: Diagnosis not present

## 2022-02-16 DIAGNOSIS — R9431 Abnormal electrocardiogram [ECG] [EKG]: Secondary | ICD-10-CM | POA: Insufficient documentation

## 2022-02-16 DIAGNOSIS — N186 End stage renal disease: Secondary | ICD-10-CM | POA: Diagnosis not present

## 2022-02-16 DIAGNOSIS — X58XXXA Exposure to other specified factors, initial encounter: Secondary | ICD-10-CM | POA: Diagnosis not present

## 2022-02-16 DIAGNOSIS — Z9989 Dependence on other enabling machines and devices: Secondary | ICD-10-CM | POA: Insufficient documentation

## 2022-02-16 DIAGNOSIS — E669 Obesity, unspecified: Secondary | ICD-10-CM | POA: Insufficient documentation

## 2022-02-16 DIAGNOSIS — I2510 Atherosclerotic heart disease of native coronary artery without angina pectoris: Secondary | ICD-10-CM | POA: Diagnosis not present

## 2022-02-16 DIAGNOSIS — D509 Iron deficiency anemia, unspecified: Secondary | ICD-10-CM | POA: Diagnosis not present

## 2022-02-16 DIAGNOSIS — Z7982 Long term (current) use of aspirin: Secondary | ICD-10-CM | POA: Insufficient documentation

## 2022-02-16 DIAGNOSIS — Z992 Dependence on renal dialysis: Secondary | ICD-10-CM | POA: Diagnosis not present

## 2022-02-16 DIAGNOSIS — I447 Left bundle-branch block, unspecified: Secondary | ICD-10-CM | POA: Diagnosis not present

## 2022-02-16 DIAGNOSIS — I5022 Chronic systolic (congestive) heart failure: Secondary | ICD-10-CM | POA: Diagnosis not present

## 2022-02-16 DIAGNOSIS — E875 Hyperkalemia: Secondary | ICD-10-CM | POA: Diagnosis not present

## 2022-02-16 DIAGNOSIS — T783XXS Angioneurotic edema, sequela: Secondary | ICD-10-CM

## 2022-02-16 DIAGNOSIS — Z8249 Family history of ischemic heart disease and other diseases of the circulatory system: Secondary | ICD-10-CM | POA: Insufficient documentation

## 2022-02-16 DIAGNOSIS — I255 Ischemic cardiomyopathy: Secondary | ICD-10-CM | POA: Insufficient documentation

## 2022-02-16 DIAGNOSIS — Z79899 Other long term (current) drug therapy: Secondary | ICD-10-CM | POA: Diagnosis not present

## 2022-02-16 DIAGNOSIS — I491 Atrial premature depolarization: Secondary | ICD-10-CM | POA: Insufficient documentation

## 2022-02-16 DIAGNOSIS — D631 Anemia in chronic kidney disease: Secondary | ICD-10-CM | POA: Diagnosis not present

## 2022-02-16 DIAGNOSIS — Z95810 Presence of automatic (implantable) cardiac defibrillator: Secondary | ICD-10-CM | POA: Diagnosis not present

## 2022-02-16 DIAGNOSIS — Z8674 Personal history of sudden cardiac arrest: Secondary | ICD-10-CM | POA: Diagnosis not present

## 2022-02-16 DIAGNOSIS — T783XXA Angioneurotic edema, initial encounter: Secondary | ICD-10-CM | POA: Insufficient documentation

## 2022-02-16 DIAGNOSIS — I132 Hypertensive heart and chronic kidney disease with heart failure and with stage 5 chronic kidney disease, or end stage renal disease: Secondary | ICD-10-CM | POA: Insufficient documentation

## 2022-02-16 DIAGNOSIS — N2581 Secondary hyperparathyroidism of renal origin: Secondary | ICD-10-CM | POA: Diagnosis not present

## 2022-02-16 NOTE — Patient Instructions (Signed)
Thank you for coming in today ? ?EP clinic will call you and make a appointment for follow up ? ?Your physician recommends that you schedule a follow-up appointment in:  ?4 months with Dr. Aundra Dubin ? ?At the [facility], you and your health needs are our priority. As part of our continuing mission to provide you with exceptional heart care, we have created designated Provider Care Teams. These Care Teams include your primary Cardiologist (physician) and Advanced Practice Providers (APPs- Physician Assistants and Nurse Practitioners) who all work together to provide you with the care you need, when you need it.  ? ?You may see any of the following providers on your designated Care Team at your next follow up: ?Dr Glori Bickers ?Dr Loralie Champagne ?Darrick Grinder, NP ?Lyda Jester, PA ?Jessica Milford,NP ?Marlyce Huge, PA ?Audry Riles, PharmD ? ? ?Please be sure to bring in all your medications bottles to every appointment.  ? ?If you have any questions or concerns before your next appointment please send [place] a message through [place] or call our office at [phone].   ? ?TO LEAVE A MESSAGE FOR THE NURSE SELECT OPTION 2, PLEASE LEAVE A MESSAGE INCLUDING: ?YOUR NAME ?DATE OF BIRTH ?CALL BACK NUMBER ?REASON FOR CALL**this is important as we prioritize the call backs ? ?YOU WILL RECEIVE A CALL BACK THE SAME DAY AS LONG AS YOU CALL BEFORE 4:00 PM ? ?

## 2022-02-17 DIAGNOSIS — G4733 Obstructive sleep apnea (adult) (pediatric): Secondary | ICD-10-CM | POA: Diagnosis not present

## 2022-02-18 DIAGNOSIS — N186 End stage renal disease: Secondary | ICD-10-CM | POA: Diagnosis not present

## 2022-02-18 DIAGNOSIS — N2581 Secondary hyperparathyroidism of renal origin: Secondary | ICD-10-CM | POA: Diagnosis not present

## 2022-02-18 DIAGNOSIS — E875 Hyperkalemia: Secondary | ICD-10-CM | POA: Diagnosis not present

## 2022-02-18 DIAGNOSIS — D509 Iron deficiency anemia, unspecified: Secondary | ICD-10-CM | POA: Diagnosis not present

## 2022-02-18 DIAGNOSIS — Z992 Dependence on renal dialysis: Secondary | ICD-10-CM | POA: Diagnosis not present

## 2022-02-18 DIAGNOSIS — D631 Anemia in chronic kidney disease: Secondary | ICD-10-CM | POA: Diagnosis not present

## 2022-02-19 DIAGNOSIS — I871 Compression of vein: Secondary | ICD-10-CM | POA: Diagnosis not present

## 2022-02-19 DIAGNOSIS — Z992 Dependence on renal dialysis: Secondary | ICD-10-CM | POA: Diagnosis not present

## 2022-02-19 DIAGNOSIS — T82858A Stenosis of vascular prosthetic devices, implants and grafts, initial encounter: Secondary | ICD-10-CM | POA: Diagnosis not present

## 2022-02-19 DIAGNOSIS — N186 End stage renal disease: Secondary | ICD-10-CM | POA: Diagnosis not present

## 2022-02-20 DIAGNOSIS — Z992 Dependence on renal dialysis: Secondary | ICD-10-CM | POA: Diagnosis not present

## 2022-02-20 DIAGNOSIS — N186 End stage renal disease: Secondary | ICD-10-CM | POA: Diagnosis not present

## 2022-02-20 DIAGNOSIS — D631 Anemia in chronic kidney disease: Secondary | ICD-10-CM | POA: Diagnosis not present

## 2022-02-20 DIAGNOSIS — E875 Hyperkalemia: Secondary | ICD-10-CM | POA: Diagnosis not present

## 2022-02-20 DIAGNOSIS — D509 Iron deficiency anemia, unspecified: Secondary | ICD-10-CM | POA: Diagnosis not present

## 2022-02-20 DIAGNOSIS — N2581 Secondary hyperparathyroidism of renal origin: Secondary | ICD-10-CM | POA: Diagnosis not present

## 2022-02-23 DIAGNOSIS — D631 Anemia in chronic kidney disease: Secondary | ICD-10-CM | POA: Diagnosis not present

## 2022-02-23 DIAGNOSIS — E875 Hyperkalemia: Secondary | ICD-10-CM | POA: Diagnosis not present

## 2022-02-23 DIAGNOSIS — N2581 Secondary hyperparathyroidism of renal origin: Secondary | ICD-10-CM | POA: Diagnosis not present

## 2022-02-23 DIAGNOSIS — D509 Iron deficiency anemia, unspecified: Secondary | ICD-10-CM | POA: Diagnosis not present

## 2022-02-23 DIAGNOSIS — Z992 Dependence on renal dialysis: Secondary | ICD-10-CM | POA: Diagnosis not present

## 2022-02-23 DIAGNOSIS — N186 End stage renal disease: Secondary | ICD-10-CM | POA: Diagnosis not present

## 2022-02-25 DIAGNOSIS — D631 Anemia in chronic kidney disease: Secondary | ICD-10-CM | POA: Diagnosis not present

## 2022-02-25 DIAGNOSIS — N186 End stage renal disease: Secondary | ICD-10-CM | POA: Diagnosis not present

## 2022-02-25 DIAGNOSIS — Z992 Dependence on renal dialysis: Secondary | ICD-10-CM | POA: Diagnosis not present

## 2022-02-25 DIAGNOSIS — E875 Hyperkalemia: Secondary | ICD-10-CM | POA: Diagnosis not present

## 2022-02-25 DIAGNOSIS — N2581 Secondary hyperparathyroidism of renal origin: Secondary | ICD-10-CM | POA: Diagnosis not present

## 2022-02-25 DIAGNOSIS — D509 Iron deficiency anemia, unspecified: Secondary | ICD-10-CM | POA: Diagnosis not present

## 2022-02-27 DIAGNOSIS — N2581 Secondary hyperparathyroidism of renal origin: Secondary | ICD-10-CM | POA: Diagnosis not present

## 2022-02-27 DIAGNOSIS — E875 Hyperkalemia: Secondary | ICD-10-CM | POA: Diagnosis not present

## 2022-02-27 DIAGNOSIS — D631 Anemia in chronic kidney disease: Secondary | ICD-10-CM | POA: Diagnosis not present

## 2022-02-27 DIAGNOSIS — Z992 Dependence on renal dialysis: Secondary | ICD-10-CM | POA: Diagnosis not present

## 2022-02-27 DIAGNOSIS — D509 Iron deficiency anemia, unspecified: Secondary | ICD-10-CM | POA: Diagnosis not present

## 2022-02-27 DIAGNOSIS — N186 End stage renal disease: Secondary | ICD-10-CM | POA: Diagnosis not present

## 2022-03-02 DIAGNOSIS — E875 Hyperkalemia: Secondary | ICD-10-CM | POA: Diagnosis not present

## 2022-03-02 DIAGNOSIS — D631 Anemia in chronic kidney disease: Secondary | ICD-10-CM | POA: Diagnosis not present

## 2022-03-02 DIAGNOSIS — D509 Iron deficiency anemia, unspecified: Secondary | ICD-10-CM | POA: Diagnosis not present

## 2022-03-02 DIAGNOSIS — N186 End stage renal disease: Secondary | ICD-10-CM | POA: Diagnosis not present

## 2022-03-02 DIAGNOSIS — Z992 Dependence on renal dialysis: Secondary | ICD-10-CM | POA: Diagnosis not present

## 2022-03-02 DIAGNOSIS — N2581 Secondary hyperparathyroidism of renal origin: Secondary | ICD-10-CM | POA: Diagnosis not present

## 2022-03-04 DIAGNOSIS — N2581 Secondary hyperparathyroidism of renal origin: Secondary | ICD-10-CM | POA: Diagnosis not present

## 2022-03-04 DIAGNOSIS — Z992 Dependence on renal dialysis: Secondary | ICD-10-CM | POA: Diagnosis not present

## 2022-03-04 DIAGNOSIS — N186 End stage renal disease: Secondary | ICD-10-CM | POA: Diagnosis not present

## 2022-03-04 DIAGNOSIS — E875 Hyperkalemia: Secondary | ICD-10-CM | POA: Diagnosis not present

## 2022-03-04 DIAGNOSIS — D631 Anemia in chronic kidney disease: Secondary | ICD-10-CM | POA: Diagnosis not present

## 2022-03-04 DIAGNOSIS — D509 Iron deficiency anemia, unspecified: Secondary | ICD-10-CM | POA: Diagnosis not present

## 2022-03-05 ENCOUNTER — Ambulatory Visit: Payer: Self-pay

## 2022-03-05 DIAGNOSIS — Z992 Dependence on renal dialysis: Secondary | ICD-10-CM | POA: Diagnosis not present

## 2022-03-05 DIAGNOSIS — I129 Hypertensive chronic kidney disease with stage 1 through stage 4 chronic kidney disease, or unspecified chronic kidney disease: Secondary | ICD-10-CM | POA: Diagnosis not present

## 2022-03-05 DIAGNOSIS — N186 End stage renal disease: Secondary | ICD-10-CM | POA: Diagnosis not present

## 2022-03-05 NOTE — Telephone Encounter (Signed)
Pt called in for assistance. 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  ? ? ?Pt would like to discuss what to do further ? ?Pt. Asking to be seen as soon as possible for this so he can be cleared on his DOT physical. Please advise pt. ? ?Answer Assessment - Initial Assessment Questions ?1. REASON FOR CALL: "What is your main concern right now?" ?    Went for DOT physical and was told he has protein and blood in urine. Needs to be seen so he can pass this physical ?2. ONSET: "When did the n/a start?" ?    N/a ?3. SEVERITY: "How bad is the n/a?" ?    N/a ?4. FEVER: "Do you have a fever?" ?    No ?5. OTHER SYMPTOMS: "Do you have any other new symptoms?" ?    None ?6. TREATMENTS AND RESPONSE: "What have you done so far to try to make this better? What medicines have you used?" ?    N/a ?7. PREGNANCY: "Is there any chance you are pregnant?" "When was your last menstrual period?" ?    No ? ?Protocols used: No Guideline Available-A-AH ? ?

## 2022-03-06 DIAGNOSIS — N2581 Secondary hyperparathyroidism of renal origin: Secondary | ICD-10-CM | POA: Diagnosis not present

## 2022-03-06 DIAGNOSIS — Z992 Dependence on renal dialysis: Secondary | ICD-10-CM | POA: Diagnosis not present

## 2022-03-06 DIAGNOSIS — Z23 Encounter for immunization: Secondary | ICD-10-CM | POA: Diagnosis not present

## 2022-03-06 DIAGNOSIS — D631 Anemia in chronic kidney disease: Secondary | ICD-10-CM | POA: Diagnosis not present

## 2022-03-06 DIAGNOSIS — D509 Iron deficiency anemia, unspecified: Secondary | ICD-10-CM | POA: Diagnosis not present

## 2022-03-06 DIAGNOSIS — N186 End stage renal disease: Secondary | ICD-10-CM | POA: Diagnosis not present

## 2022-03-06 DIAGNOSIS — R52 Pain, unspecified: Secondary | ICD-10-CM | POA: Diagnosis not present

## 2022-03-09 ENCOUNTER — Other Ambulatory Visit: Payer: Self-pay | Admitting: Internal Medicine

## 2022-03-09 DIAGNOSIS — N2581 Secondary hyperparathyroidism of renal origin: Secondary | ICD-10-CM | POA: Diagnosis not present

## 2022-03-09 DIAGNOSIS — D631 Anemia in chronic kidney disease: Secondary | ICD-10-CM | POA: Diagnosis not present

## 2022-03-09 DIAGNOSIS — N186 End stage renal disease: Secondary | ICD-10-CM | POA: Diagnosis not present

## 2022-03-09 DIAGNOSIS — R52 Pain, unspecified: Secondary | ICD-10-CM | POA: Diagnosis not present

## 2022-03-09 DIAGNOSIS — D509 Iron deficiency anemia, unspecified: Secondary | ICD-10-CM | POA: Diagnosis not present

## 2022-03-09 DIAGNOSIS — Z23 Encounter for immunization: Secondary | ICD-10-CM | POA: Diagnosis not present

## 2022-03-09 DIAGNOSIS — Z992 Dependence on renal dialysis: Secondary | ICD-10-CM | POA: Diagnosis not present

## 2022-03-09 NOTE — Telephone Encounter (Signed)
Contacted pt to go over provider message and to schedule an appt pt didn't answer lvm  ?

## 2022-03-09 NOTE — Telephone Encounter (Signed)
Pt returned call. PT is schedule a virtual with provider for 4/13 ?

## 2022-03-09 NOTE — Telephone Encounter (Signed)
Correction 4/14  ?

## 2022-03-11 DIAGNOSIS — Z23 Encounter for immunization: Secondary | ICD-10-CM | POA: Diagnosis not present

## 2022-03-11 DIAGNOSIS — R52 Pain, unspecified: Secondary | ICD-10-CM | POA: Diagnosis not present

## 2022-03-11 DIAGNOSIS — D509 Iron deficiency anemia, unspecified: Secondary | ICD-10-CM | POA: Diagnosis not present

## 2022-03-11 DIAGNOSIS — Z992 Dependence on renal dialysis: Secondary | ICD-10-CM | POA: Diagnosis not present

## 2022-03-11 DIAGNOSIS — N186 End stage renal disease: Secondary | ICD-10-CM | POA: Diagnosis not present

## 2022-03-11 DIAGNOSIS — N2581 Secondary hyperparathyroidism of renal origin: Secondary | ICD-10-CM | POA: Diagnosis not present

## 2022-03-11 DIAGNOSIS — D631 Anemia in chronic kidney disease: Secondary | ICD-10-CM | POA: Diagnosis not present

## 2022-03-13 DIAGNOSIS — N186 End stage renal disease: Secondary | ICD-10-CM | POA: Diagnosis not present

## 2022-03-13 DIAGNOSIS — N2581 Secondary hyperparathyroidism of renal origin: Secondary | ICD-10-CM | POA: Diagnosis not present

## 2022-03-13 DIAGNOSIS — D509 Iron deficiency anemia, unspecified: Secondary | ICD-10-CM | POA: Diagnosis not present

## 2022-03-13 DIAGNOSIS — Z992 Dependence on renal dialysis: Secondary | ICD-10-CM | POA: Diagnosis not present

## 2022-03-13 DIAGNOSIS — R52 Pain, unspecified: Secondary | ICD-10-CM | POA: Diagnosis not present

## 2022-03-13 DIAGNOSIS — D631 Anemia in chronic kidney disease: Secondary | ICD-10-CM | POA: Diagnosis not present

## 2022-03-13 DIAGNOSIS — Z23 Encounter for immunization: Secondary | ICD-10-CM | POA: Diagnosis not present

## 2022-03-15 ENCOUNTER — Ambulatory Visit (INDEPENDENT_AMBULATORY_CARE_PROVIDER_SITE_OTHER): Payer: Self-pay

## 2022-03-15 DIAGNOSIS — I255 Ischemic cardiomyopathy: Secondary | ICD-10-CM

## 2022-03-15 DIAGNOSIS — N186 End stage renal disease: Secondary | ICD-10-CM | POA: Diagnosis not present

## 2022-03-15 DIAGNOSIS — D631 Anemia in chronic kidney disease: Secondary | ICD-10-CM | POA: Diagnosis not present

## 2022-03-15 DIAGNOSIS — Z23 Encounter for immunization: Secondary | ICD-10-CM | POA: Diagnosis not present

## 2022-03-15 DIAGNOSIS — R52 Pain, unspecified: Secondary | ICD-10-CM | POA: Diagnosis not present

## 2022-03-15 DIAGNOSIS — N2581 Secondary hyperparathyroidism of renal origin: Secondary | ICD-10-CM | POA: Diagnosis not present

## 2022-03-15 DIAGNOSIS — Z992 Dependence on renal dialysis: Secondary | ICD-10-CM | POA: Diagnosis not present

## 2022-03-15 DIAGNOSIS — D509 Iron deficiency anemia, unspecified: Secondary | ICD-10-CM | POA: Diagnosis not present

## 2022-03-16 LAB — CUP PACEART REMOTE DEVICE CHECK
Battery Remaining Percentage: 84 %
Date Time Interrogation Session: 20230410120800
Implantable Lead Implant Date: 20150507
Implantable Lead Implant Date: 20150507
Implantable Lead Location: 753862
Implantable Lead Location: 753862
Implantable Lead Model: 3400
Implantable Lead Model: 3400
Implantable Pulse Generator Implant Date: 20211108
Pulse Gen Serial Number: 146303

## 2022-03-18 ENCOUNTER — Encounter: Payer: Medicare Other | Admitting: Student

## 2022-03-19 ENCOUNTER — Ambulatory Visit: Payer: Medicare Other | Attending: Internal Medicine | Admitting: Internal Medicine

## 2022-03-19 DIAGNOSIS — D509 Iron deficiency anemia, unspecified: Secondary | ICD-10-CM | POA: Diagnosis not present

## 2022-03-19 DIAGNOSIS — R808 Other proteinuria: Secondary | ICD-10-CM

## 2022-03-19 DIAGNOSIS — D631 Anemia in chronic kidney disease: Secondary | ICD-10-CM | POA: Diagnosis not present

## 2022-03-19 DIAGNOSIS — Z23 Encounter for immunization: Secondary | ICD-10-CM | POA: Diagnosis not present

## 2022-03-19 DIAGNOSIS — R3129 Other microscopic hematuria: Secondary | ICD-10-CM

## 2022-03-19 DIAGNOSIS — N186 End stage renal disease: Secondary | ICD-10-CM | POA: Diagnosis not present

## 2022-03-19 DIAGNOSIS — N2581 Secondary hyperparathyroidism of renal origin: Secondary | ICD-10-CM | POA: Diagnosis not present

## 2022-03-19 DIAGNOSIS — Z992 Dependence on renal dialysis: Secondary | ICD-10-CM | POA: Diagnosis not present

## 2022-03-19 DIAGNOSIS — R52 Pain, unspecified: Secondary | ICD-10-CM | POA: Diagnosis not present

## 2022-03-19 NOTE — Progress Notes (Signed)
Patient ID: Victor Thompson, male   DOB: 12-20-1960, 61 y.o.   MRN: [id number] ?Virtual Visit via Telephone Note ? ?I connected with Victor Thompson on 03/19/2022 at 11:29 AM by telephone and verified that I am speaking with the correct person using two identifiers ? ?Location: ?Patient: home ?Provider: office ? ?Participants: ?Myself ?Patient ?  ?I discussed the limitations, risks, security and privacy concerns of performing an evaluation and management service by telephone and the availability of in person appointments. I also discussed with the patient that there may be a patient responsible charge related to this service. The patient expressed understanding and agreed to proceed. ? ? ?History of Present Illness: ?Pt with hx of ESRD on hemodialysis (Dr. Joelyn Oms), HTN, CAD (with hx of cardiac arrest), ICD, combined sys/dia CHF (EF 25-30 % 01/2021), HL, OSA on CPAP, Obese, COPD, chronic lower back pain with multilevel spondylosis.  ? 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  He was found to have blood and protein in the urine. ?-Patient states they told him that he needs to be evaluated by his primary for this before they can dispense his license. ?-Of note, patient has a history of subnephrotic range proteinuria that is not new.  He had this from the time he was diagnosed with CKD and has since progressed to ESRD and is on hemodialysis.  His nephrologist is Dr. Joelyn Oms. ?-Denies any gross hematuria. ? ?He is concerned about obesity especially around his midsection.  Recently went on a 3-day cruise to the [place] and states that he over ate.  Just got back a day or 2 ago.  On his recent dialysis he had to have more than his usual amount of fluid pulled off.  I referred him to nutritionist on our last visit in March with morbid obesity.  However I think his insurance did not cover for that. 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  Works  about 3 to 4 days a week. ? ? ?Outpatient Encounter Medications as of 03/19/2022  ?Medication Sig  ? albuterol (PROVENTIL) (2.5 MG/3ML) 0.083% nebulizer solution TAKE 3 MLS (2.5 MG TOTAL) BY NEBULIZATION EVERY 6 (SIX) HOURS AS NEEDED FOR WHEEZING OR SHORTNESS OF BREATH.  ? albuterol (VENTOLIN HFA) 108 (90 Base) MCG/ACT inhaler Inhale 2 puffs into the lungs every 4 (four) hours as needed for wheezing or shortness of breath.  ? aspirin EC 81 MG tablet Take 1 tablet (81 mg total) by mouth daily.  ? atorvastatin (LIPITOR) 40 MG tablet TAKE 1 TABLET (40 MG TOTAL) BY MOUTH AT BEDTIME.  ? AURYXIA 1 GM 210 MG(Fe) tablet Take 1 tablet (210 mg total) by mouth 3 (three) times daily.  ? calcium acetate (PHOSLO) 667 MG capsule Take 2 capsule by mouth three times a day with meals  ? carvedilol (COREG) 25 MG tablet TAKE 1 TABLET (25 MG TOTAL) BY MOUTH 2 (TWO) TIMES DAILY WITH A MEAL.  ? cetirizine (ZYRTEC) 10 MG tablet Take 1 tablet (10 mg total) by mouth 2 (two) times daily.  ? Cholecalciferol (VITAMIN D-3) 125 MCG (5000 UT) TABS Take 5,000 Units by mouth daily.  ? docusate sodium (COLACE) 100 MG capsule Take 1 capsule (100 mg total) by mouth 2 (two) times daily.  ? EPINEPHrine 0.3 mg/0.3 mL IJ SOAJ injection Inject 0.3 mg into the muscle once as needed for up to 2 doses (if worsening tongue swelling, SOB, hypoxia, or other  concerns for progressive anaphylaxis).  ? ethyl chloride spray SPRAY A SMALL AMOUNT THREE TIMES A WEEK JUST PRIOR TO NEEDLE INSERTION  ? famotidine (PEPCID) 20 MG tablet Take 1 tablet (20 mg total) by mouth 2 (two) times daily.  ? fluticasone (FLONASE) 50 MCG/ACT nasal spray Place into both nostrils as needed for allergies or rhinitis.  ? hydrALAZINE (APRESOLINE) 25 MG tablet Take 3 tablets (75 mg total) by mouth 3 (three) times daily.  ? isosorbide mononitrate (IMDUR) 60 MG 24 hr tablet Take 1 tablet (60 mg total) by mouth daily.  ? loratadine (CLARITIN) 10 MG tablet Take 10 mg by mouth daily as needed for  allergies or rhinitis.  ? Methoxy PEG-Epoetin Beta (MIRCERA IJ)   ? multivitamin (RENA-VIT) TABS tablet TAKE 1 TABLET BY MOUTH ONCE DAILY  ? sildenafil (VIAGRA) 50 MG tablet Take 1 tablet by mouth once a day as needed 30 min prior to sexual activity.  ? SYMBICORT 80-4.5 MCG/ACT inhaler INHALE TWO PUFFS INTO THE LUNGS TWICE DAILY (BULK)  ? ?No facility-administered encounter medications on file as of 03/19/2022.  ? ? ?  ?Observations/Objective: ?No direct observation done as this was a telephone visit. ? ?Assessment and Plan: ?1. Other proteinuria ?Due to underlying kidney disease.  Not a new finding. ? ?2. Microscopic hematuria ?We will have him come in and do a urinalysis.  This too may be due to underlying kidney disease but if persists, he may need work-up for which he can see his nephrologist Dr. Joelyn Oms or her urologist. ?I will get back to him once I have the results. ? ?3. Morbid obesity ([place]) ?Discussed and encourage healthy eating habits.  Encouraged him to cut back on portion sizes.  When he comes to the lab to do urinalysis, we will check an A1c to screen for diabetes. ? ? ?Follow Up Instructions: ?As previously scheduled. ?  ?I discussed the assessment and treatment plan with the patient. The patient was provided an opportunity to ask questions and all were answered. The patient agreed with the plan and demonstrated an understanding of the instructions. ?  ?The patient was advised to call back or seek an in-person evaluation if the symptoms worsen or if the condition fails to improve as anticipated. ? ?I  Spent 12 minutes on this telephone encounter ? ?This note has been created with Surveyor, quantity. Any transcriptional errors are unintentional. ? ?Karle Plumber, MD ? ?

## 2022-03-20 DIAGNOSIS — D509 Iron deficiency anemia, unspecified: Secondary | ICD-10-CM | POA: Diagnosis not present

## 2022-03-20 DIAGNOSIS — Z992 Dependence on renal dialysis: Secondary | ICD-10-CM | POA: Diagnosis not present

## 2022-03-20 DIAGNOSIS — R52 Pain, unspecified: Secondary | ICD-10-CM | POA: Diagnosis not present

## 2022-03-20 DIAGNOSIS — N2581 Secondary hyperparathyroidism of renal origin: Secondary | ICD-10-CM | POA: Diagnosis not present

## 2022-03-20 DIAGNOSIS — N186 End stage renal disease: Secondary | ICD-10-CM | POA: Diagnosis not present

## 2022-03-20 DIAGNOSIS — Z23 Encounter for immunization: Secondary | ICD-10-CM | POA: Diagnosis not present

## 2022-03-20 DIAGNOSIS — D631 Anemia in chronic kidney disease: Secondary | ICD-10-CM | POA: Diagnosis not present

## 2022-03-22 DIAGNOSIS — G4733 Obstructive sleep apnea (adult) (pediatric): Secondary | ICD-10-CM | POA: Diagnosis not present

## 2022-03-23 DIAGNOSIS — D509 Iron deficiency anemia, unspecified: Secondary | ICD-10-CM | POA: Diagnosis not present

## 2022-03-23 DIAGNOSIS — R52 Pain, unspecified: Secondary | ICD-10-CM | POA: Diagnosis not present

## 2022-03-23 DIAGNOSIS — N2581 Secondary hyperparathyroidism of renal origin: Secondary | ICD-10-CM | POA: Diagnosis not present

## 2022-03-23 DIAGNOSIS — Z992 Dependence on renal dialysis: Secondary | ICD-10-CM | POA: Diagnosis not present

## 2022-03-23 DIAGNOSIS — D631 Anemia in chronic kidney disease: Secondary | ICD-10-CM | POA: Diagnosis not present

## 2022-03-23 DIAGNOSIS — N186 End stage renal disease: Secondary | ICD-10-CM | POA: Diagnosis not present

## 2022-03-23 DIAGNOSIS — Z23 Encounter for immunization: Secondary | ICD-10-CM | POA: Diagnosis not present

## 2022-03-25 DIAGNOSIS — D509 Iron deficiency anemia, unspecified: Secondary | ICD-10-CM | POA: Diagnosis not present

## 2022-03-25 DIAGNOSIS — R52 Pain, unspecified: Secondary | ICD-10-CM | POA: Diagnosis not present

## 2022-03-25 DIAGNOSIS — N2581 Secondary hyperparathyroidism of renal origin: Secondary | ICD-10-CM | POA: Diagnosis not present

## 2022-03-25 DIAGNOSIS — D631 Anemia in chronic kidney disease: Secondary | ICD-10-CM | POA: Diagnosis not present

## 2022-03-25 DIAGNOSIS — N186 End stage renal disease: Secondary | ICD-10-CM | POA: Diagnosis not present

## 2022-03-25 DIAGNOSIS — Z992 Dependence on renal dialysis: Secondary | ICD-10-CM | POA: Diagnosis not present

## 2022-03-25 DIAGNOSIS — Z23 Encounter for immunization: Secondary | ICD-10-CM | POA: Diagnosis not present

## 2022-03-27 DIAGNOSIS — Z23 Encounter for immunization: Secondary | ICD-10-CM | POA: Diagnosis not present

## 2022-03-27 DIAGNOSIS — N2581 Secondary hyperparathyroidism of renal origin: Secondary | ICD-10-CM | POA: Diagnosis not present

## 2022-03-27 DIAGNOSIS — R52 Pain, unspecified: Secondary | ICD-10-CM | POA: Diagnosis not present

## 2022-03-27 DIAGNOSIS — N186 End stage renal disease: Secondary | ICD-10-CM | POA: Diagnosis not present

## 2022-03-27 DIAGNOSIS — D509 Iron deficiency anemia, unspecified: Secondary | ICD-10-CM | POA: Diagnosis not present

## 2022-03-27 DIAGNOSIS — Z992 Dependence on renal dialysis: Secondary | ICD-10-CM | POA: Diagnosis not present

## 2022-03-27 DIAGNOSIS — D631 Anemia in chronic kidney disease: Secondary | ICD-10-CM | POA: Diagnosis not present

## 2022-03-30 DIAGNOSIS — Z992 Dependence on renal dialysis: Secondary | ICD-10-CM | POA: Diagnosis not present

## 2022-03-30 DIAGNOSIS — D509 Iron deficiency anemia, unspecified: Secondary | ICD-10-CM | POA: Diagnosis not present

## 2022-03-30 DIAGNOSIS — Z23 Encounter for immunization: Secondary | ICD-10-CM | POA: Diagnosis not present

## 2022-03-30 DIAGNOSIS — D631 Anemia in chronic kidney disease: Secondary | ICD-10-CM | POA: Diagnosis not present

## 2022-03-30 DIAGNOSIS — N186 End stage renal disease: Secondary | ICD-10-CM | POA: Diagnosis not present

## 2022-03-30 DIAGNOSIS — N2581 Secondary hyperparathyroidism of renal origin: Secondary | ICD-10-CM | POA: Diagnosis not present

## 2022-03-30 DIAGNOSIS — R52 Pain, unspecified: Secondary | ICD-10-CM | POA: Diagnosis not present

## 2022-03-30 NOTE — Progress Notes (Signed)
Remote ICD transmission.   

## 2022-04-01 DIAGNOSIS — N2581 Secondary hyperparathyroidism of renal origin: Secondary | ICD-10-CM | POA: Diagnosis not present

## 2022-04-01 DIAGNOSIS — Z23 Encounter for immunization: Secondary | ICD-10-CM | POA: Diagnosis not present

## 2022-04-01 DIAGNOSIS — D631 Anemia in chronic kidney disease: Secondary | ICD-10-CM | POA: Diagnosis not present

## 2022-04-01 DIAGNOSIS — R52 Pain, unspecified: Secondary | ICD-10-CM | POA: Diagnosis not present

## 2022-04-01 DIAGNOSIS — N186 End stage renal disease: Secondary | ICD-10-CM | POA: Diagnosis not present

## 2022-04-01 DIAGNOSIS — D509 Iron deficiency anemia, unspecified: Secondary | ICD-10-CM | POA: Diagnosis not present

## 2022-04-01 DIAGNOSIS — Z992 Dependence on renal dialysis: Secondary | ICD-10-CM | POA: Diagnosis not present

## 2022-04-03 DIAGNOSIS — D509 Iron deficiency anemia, unspecified: Secondary | ICD-10-CM | POA: Diagnosis not present

## 2022-04-03 DIAGNOSIS — R52 Pain, unspecified: Secondary | ICD-10-CM | POA: Diagnosis not present

## 2022-04-03 DIAGNOSIS — Z23 Encounter for immunization: Secondary | ICD-10-CM | POA: Diagnosis not present

## 2022-04-03 DIAGNOSIS — D631 Anemia in chronic kidney disease: Secondary | ICD-10-CM | POA: Diagnosis not present

## 2022-04-03 DIAGNOSIS — Z992 Dependence on renal dialysis: Secondary | ICD-10-CM | POA: Diagnosis not present

## 2022-04-03 DIAGNOSIS — N2581 Secondary hyperparathyroidism of renal origin: Secondary | ICD-10-CM | POA: Diagnosis not present

## 2022-04-03 DIAGNOSIS — N186 End stage renal disease: Secondary | ICD-10-CM | POA: Diagnosis not present

## 2022-04-04 DIAGNOSIS — I129 Hypertensive chronic kidney disease with stage 1 through stage 4 chronic kidney disease, or unspecified chronic kidney disease: Secondary | ICD-10-CM | POA: Diagnosis not present

## 2022-04-04 DIAGNOSIS — Z992 Dependence on renal dialysis: Secondary | ICD-10-CM | POA: Diagnosis not present

## 2022-04-04 DIAGNOSIS — N186 End stage renal disease: Secondary | ICD-10-CM | POA: Diagnosis not present

## 2022-04-05 ENCOUNTER — Other Ambulatory Visit: Payer: Self-pay

## 2022-04-05 NOTE — Progress Notes (Signed)
? ? ?Electrophysiology Office Note ?Date: 04/05/2022 ? ?ID:  Victor Thompson, DOB [birth year], MRN [id number] ? ?PCP: [name], MD ?Primary Cardiologist: None ?Electrophysiologist: [name], MD  ? ?CC: Routine ICD follow-up ? ?Victor Thompson is a 61 y.o. male seen today for [name], MD for routine electrophysiology followup.  Since last being seen in our clinic the patient reports doing very well.  he denies chest pain, palpitations, dyspnea, PND, orthopnea, nausea, vomiting, dizziness, syncope, edema, weight gain, or early satiety. He has not had ICD shocks.  ? ?Device History: ?[employer] S-ICD ICD implanted 04/2014, gen change 10/13/2020 for CHF and VT.  ? ?Past Medical History:  ?Diagnosis Date  ? AICD (automatic cardioverter/defibrillator) present   ? 2015  ? Asthma   ? no meds  ? Cardiac arrest [facility]) 2015  ? CHF (congestive heart failure) ([place])   ? Chronic kidney disease   ? ckd -stage 5  ? Coronary artery disease   ? Hypertension   ? Myocardial infarction [facility])   ? Obesity   ? Pneumonia   ? Pulmonary edema   ? Shortness of breath dyspnea   ? Sleep apnea   ? USES CPAP  ? Wears glasses   ? ?Past Surgical History:  ?Procedure Laterality Date  ? AV FISTULA PLACEMENT Right 03/15/2019  ? Procedure: RIGHT ARM ARTERIOVENOUS (AV) FISTULA CREATION;  Surgeon: Angelia Mould, MD;  Location: [facility] OR;  Service: Vascular;  Laterality: Right;  ? COLONOSCOPY    ? COLONOSCOPY W/ BIOPSIES AND POLYPECTOMY    ? CORONARY STENT PLACEMENT  2007  ? IMPLANTABLE CARDIOVERTER DEFIBRILLATOR IMPLANT  2015  ? RIGHT/LEFT HEART CATH AND CORONARY ANGIOGRAPHY N/A 07/08/2021  ? Procedure: RIGHT/LEFT HEART CATH AND CORONARY ANGIOGRAPHY;  Surgeon: Larey Dresser, MD;  Location: [place] [facility];  Service: Cardiovascular;  Laterality: N/A;  ? SUBQ ICD CHANGEOUT N/A 10/13/2020  ? Procedure: SUBQ ICD CHANGEOUT;  Surgeon: Evans Lance, MD;  Location: [place] [facility] LAB;  Service: Cardiovascular;  Laterality: N/A;   ? ? ?Current Outpatient Medications  ?Medication Sig Dispense Refill  ? albuterol (PROVENTIL) (2.5 MG/3ML) 0.083% nebulizer solution TAKE 3 MLS (2.5 MG TOTAL) BY NEBULIZATION EVERY 6 (SIX) HOURS AS NEEDED FOR WHEEZING OR SHORTNESS OF BREATH. 75 mL 12  ? albuterol (VENTOLIN HFA) 108 (90 Base) MCG/ACT inhaler Inhale 2 puffs into the lungs every 4 (four) hours as needed for wheezing or shortness of breath. 8 g 6  ? aspirin EC 81 MG tablet Take 1 tablet (81 mg total) by mouth daily. 30 tablet 5  ? atorvastatin (LIPITOR) 40 MG tablet TAKE 1 TABLET (40 MG TOTAL) BY MOUTH AT BEDTIME. 30 tablet 2  ? AURYXIA 1 GM 210 MG(Fe) tablet Take 1 tablet (210 mg total) by mouth 3 (three) times daily. 270 tablet 1  ? calcium acetate (PHOSLO) 667 MG capsule Take 2 capsule by mouth three times a day with meals 180 capsule 6  ? carvedilol (COREG) 25 MG tablet TAKE 1 TABLET (25 MG TOTAL) BY MOUTH 2 (TWO) TIMES DAILY WITH A MEAL. 180 tablet 1  ? cetirizine (ZYRTEC) 10 MG tablet Take 1 tablet (10 mg total) by mouth 2 (two) times daily. 60 tablet 5  ? Cholecalciferol (VITAMIN D-3) 125 MCG (5000 UT) TABS Take 5,000 Units by mouth daily.    ? docusate sodium (COLACE) 100 MG capsule Take 1 capsule (100 mg total) by mouth 2 (two) times daily. 60 capsule 4  ? EPINEPHrine  0.3 mg/0.3 mL IJ SOAJ injection Inject 0.3 mg into the muscle once as needed for up to 2 doses (if worsening tongue swelling, SOB, hypoxia, or other concerns for progressive anaphylaxis). 2 each 2  ? ethyl chloride spray SPRAY A SMALL AMOUNT THREE TIMES A WEEK JUST PRIOR TO NEEDLE INSERTION 116 mL 11  ? famotidine (PEPCID) 20 MG tablet Take 1 tablet (20 mg total) by mouth 2 (two) times daily. 60 tablet 5  ? fluticasone (FLONASE) 50 MCG/ACT nasal spray Place into both nostrils as needed for allergies or rhinitis.    ? hydrALAZINE (APRESOLINE) 25 MG tablet Take 3 tablets (75 mg total) by mouth 3 (three) times daily. 270 tablet 11  ? isosorbide mononitrate (IMDUR) 60 MG 24 hr tablet  Take 1 tablet (60 mg total) by mouth daily. 90 tablet 3  ? loratadine (CLARITIN) 10 MG tablet Take 10 mg by mouth daily as needed for allergies or rhinitis.    ? Methoxy PEG-Epoetin Beta (MIRCERA IJ)     ? multivitamin (RENA-VIT) TABS tablet TAKE 1 TABLET BY MOUTH ONCE DAILY 30 tablet 11  ? sildenafil (VIAGRA) 50 MG tablet Take 1 tablet by mouth once a day as needed 30 min prior to sexual activity. 30 tablet 0  ? SYMBICORT 80-4.5 MCG/ACT inhaler INHALE TWO PUFFS INTO THE LUNGS TWICE DAILY (BULK) 10.2 g 6  ? ?No current facility-administered medications for this visit.  ? ? ?Allergies:   Ace inhibitors, Influenza vaccines, Ketorolac, Lidocaine, Lisinopril, and Penicillins  ? ?Social History: ?Social History  ? ?Socioeconomic History  ? Marital status: Single  ?  Spouse name: Not on file  ? Number of children: Not on file  ? Years of education: Not on file  ? Highest education level: Not on file  ?Occupational History  ? Not on file  ?Tobacco Use  ? Smoking status: Never  ? Smokeless tobacco: Never  ?Vaping Use  ? Vaping Use: Never used  ?Substance and Sexual Activity  ? Alcohol use: No  ? Drug use: No  ? Sexual activity: Yes  ?Other Topics Concern  ? Not on file  ?Social History Narrative  ? ** Merged History Encounter **  ?    ? ?Social Determinants of Health  ? ?Financial Resource Strain: Not on file  ?Food Insecurity: Not on file  ?Transportation Needs: Not on file  ?Physical Activity: Not on file  ?Stress: Not on file  ?Social Connections: Not on file  ?Intimate Partner Violence: Not on file  ? ? ?Family History: ?Family History  ?Problem Relation Age of Onset  ? Hypertension Mother   ? Liver disease Father   ? ? ?Review of Systems: ?All other systems reviewed and are otherwise negative except as noted above. ? ? ?Physical Exam: ?There were no vitals filed for this visit.  ? ?GEN- The patient is well appearing, alert and oriented x 3 today.   ?HEENT: normocephalic, atraumatic; sclera clear, conjunctiva pink;  hearing intact; oropharynx clear; neck supple, no JVP ?Lymph- no cervical lymphadenopathy ?Lungs- Clear to ausculation bilaterally, normal work of breathing.  No wheezes, rales, rhonchi ?Heart- Regular rate and rhythm, no murmurs, rubs or gallops, PMI not laterally displaced ?GI- soft, non-tender, non-distended, bowel sounds present, no hepatosplenomegaly ?Extremities- no clubbing or cyanosis. No edema; DP/PT/radial pulses 2+ bilaterally ?MS- no significant deformity or atrophy ?Skin- warm and dry, no rash or lesion; ICD pocket well healed ?Psych- euthymic mood, full affect ?Neuro- strength and sensation are intact ? ?ICD interrogation- reviewed  in detail today,  See PACEART report ? ?EKG:  EKG is not ordered today. ?Personal review of EKG ordered  02/16/2022  shows NSR at 89 bpm with PVCs ? ?Recent Labs: ?07/13/2021: Platelets 250 ?09/08/2021: ALT 31; BUN 25; Creatinine, Ser 8.19; Hemoglobin 12.0; Potassium 4.4; Sodium 142  ? ?Wt Readings from Last 3 Encounters:  ?02/16/22 268 lb 3.2 oz (121.7 kg)  ?02/15/22 273 lb (123.8 kg)  ?12/03/21 269 lb 12.8 oz (122.4 kg)  ?  ? ?Other studies Reviewed: ?Additional studies/ records that were reviewed today include: Previous EP office notes.  ? ?Assessment and Plan: ? ?1.  Chronic systolic dysfunction s/p [employer] S-ICD  ?euvolemic today ?Stable on an appropriate medical regimen ?Normal ICD function ?See Claudia Desanct[facility] Art report ?No changes today ? ?2. Prior VF arrest ?Normal ICD function ?  ?3. ESRD on HD ?T/Th/Saturday ?  ?4. "AF" episodes on S-ICD ?IF true, burden is low and last episode 04/2021. ?If burden increased would likely need to confirm with monitor.  ?With ESRD, would need to go on Eliquis. For now, continue to follow burden.  ?Only way to truly clarify would be wearing Zio vs 30 day event. Consider if burden increases. He does have PVCs ? ? ?Current medicines are reviewed at length with the patient today.   ? ? ?Disposition:   Follow up with Dr. Quentin Ore in 12 months.   ? ? ?Signed, ?Shirley Friar, PA-C  ?04/05/2022 ?11:56 AM ? ?[facility] ?[address] ?[address] ?[place] [address] ?[phone] (office) ?[phone] (fax)  ?

## 2022-04-06 DIAGNOSIS — N186 End stage renal disease: Secondary | ICD-10-CM | POA: Diagnosis not present

## 2022-04-06 DIAGNOSIS — D509 Iron deficiency anemia, unspecified: Secondary | ICD-10-CM | POA: Diagnosis not present

## 2022-04-06 DIAGNOSIS — D631 Anemia in chronic kidney disease: Secondary | ICD-10-CM | POA: Diagnosis not present

## 2022-04-06 DIAGNOSIS — Z992 Dependence on renal dialysis: Secondary | ICD-10-CM | POA: Diagnosis not present

## 2022-04-06 DIAGNOSIS — N2581 Secondary hyperparathyroidism of renal origin: Secondary | ICD-10-CM | POA: Diagnosis not present

## 2022-04-06 DIAGNOSIS — Z23 Encounter for immunization: Secondary | ICD-10-CM | POA: Diagnosis not present

## 2022-04-08 DIAGNOSIS — Z23 Encounter for immunization: Secondary | ICD-10-CM | POA: Diagnosis not present

## 2022-04-08 DIAGNOSIS — N186 End stage renal disease: Secondary | ICD-10-CM | POA: Diagnosis not present

## 2022-04-08 DIAGNOSIS — N2581 Secondary hyperparathyroidism of renal origin: Secondary | ICD-10-CM | POA: Diagnosis not present

## 2022-04-08 DIAGNOSIS — D631 Anemia in chronic kidney disease: Secondary | ICD-10-CM | POA: Diagnosis not present

## 2022-04-08 DIAGNOSIS — D509 Iron deficiency anemia, unspecified: Secondary | ICD-10-CM | POA: Diagnosis not present

## 2022-04-08 DIAGNOSIS — Z992 Dependence on renal dialysis: Secondary | ICD-10-CM | POA: Diagnosis not present

## 2022-04-10 DIAGNOSIS — Z23 Encounter for immunization: Secondary | ICD-10-CM | POA: Diagnosis not present

## 2022-04-10 DIAGNOSIS — N186 End stage renal disease: Secondary | ICD-10-CM | POA: Diagnosis not present

## 2022-04-10 DIAGNOSIS — D631 Anemia in chronic kidney disease: Secondary | ICD-10-CM | POA: Diagnosis not present

## 2022-04-10 DIAGNOSIS — Z992 Dependence on renal dialysis: Secondary | ICD-10-CM | POA: Diagnosis not present

## 2022-04-10 DIAGNOSIS — D509 Iron deficiency anemia, unspecified: Secondary | ICD-10-CM | POA: Diagnosis not present

## 2022-04-10 DIAGNOSIS — N2581 Secondary hyperparathyroidism of renal origin: Secondary | ICD-10-CM | POA: Diagnosis not present

## 2022-04-12 ENCOUNTER — Ambulatory Visit (INDEPENDENT_AMBULATORY_CARE_PROVIDER_SITE_OTHER): Payer: Medicare Other | Admitting: Student

## 2022-04-12 ENCOUNTER — Encounter: Payer: Self-pay | Admitting: Student

## 2022-04-12 VITALS — BP 122/74 | HR 99 | Ht 67.0 in | Wt 270.8 lb

## 2022-04-12 DIAGNOSIS — I2510 Atherosclerotic heart disease of native coronary artery without angina pectoris: Secondary | ICD-10-CM | POA: Diagnosis not present

## 2022-04-12 DIAGNOSIS — N186 End stage renal disease: Secondary | ICD-10-CM | POA: Diagnosis not present

## 2022-04-12 DIAGNOSIS — Z992 Dependence on renal dialysis: Secondary | ICD-10-CM | POA: Diagnosis not present

## 2022-04-12 DIAGNOSIS — I255 Ischemic cardiomyopathy: Secondary | ICD-10-CM

## 2022-04-12 DIAGNOSIS — I5022 Chronic systolic (congestive) heart failure: Secondary | ICD-10-CM

## 2022-04-12 LAB — CUP PACEART INCLINIC DEVICE CHECK
Date Time Interrogation Session: 20230508094439
Implantable Lead Implant Date: 20150507
Implantable Lead Implant Date: 20150507
Implantable Lead Location: 753862
Implantable Lead Location: 753862
Implantable Lead Model: 3400
Implantable Lead Model: 3400
Implantable Pulse Generator Implant Date: 20211108
Pulse Gen Serial Number: 146303

## 2022-04-12 NOTE — Patient Instructions (Signed)
Medication Instructions:  ?Your physician recommends that you continue on your current medications as directed. Please refer to the Current Medication list given to you today. ? ?*If you need a refill on your cardiac medications before your next appointment, please call your pharmacy* ? ? ?Lab Work: ?None  ?If you have labs (blood work) drawn today and your tests are completely normal, you will receive your results only by: ?MyChart Message (if you have MyChart) OR ?A paper copy in the mail ?If you have any lab test that is abnormal or we need to change your treatment, we will call you to review the results. ? ? ?Follow-Up: ?At [facility], you and your health needs are our priority.  As part of our continuing mission to provide you with exceptional heart care, we have created designated Provider Care Teams.  These Care Teams include your primary Cardiologist (physician) and Advanced Practice Providers (APPs -  Physician Assistants and Nurse Practitioners) who all work together to provide you with the care you need, when you need it. ? ?Your next appointment:   ?1 year(s) ? ?The format for your next appointment:   ?In Person ? ?Provider:   ?Cameron Lambert, MD{ ?

## 2022-04-13 DIAGNOSIS — Z992 Dependence on renal dialysis: Secondary | ICD-10-CM | POA: Diagnosis not present

## 2022-04-13 DIAGNOSIS — N2581 Secondary hyperparathyroidism of renal origin: Secondary | ICD-10-CM | POA: Diagnosis not present

## 2022-04-13 DIAGNOSIS — D509 Iron deficiency anemia, unspecified: Secondary | ICD-10-CM | POA: Diagnosis not present

## 2022-04-13 DIAGNOSIS — D631 Anemia in chronic kidney disease: Secondary | ICD-10-CM | POA: Diagnosis not present

## 2022-04-13 DIAGNOSIS — N186 End stage renal disease: Secondary | ICD-10-CM | POA: Diagnosis not present

## 2022-04-13 DIAGNOSIS — Z23 Encounter for immunization: Secondary | ICD-10-CM | POA: Diagnosis not present

## 2022-04-15 DIAGNOSIS — Z992 Dependence on renal dialysis: Secondary | ICD-10-CM | POA: Diagnosis not present

## 2022-04-15 DIAGNOSIS — N186 End stage renal disease: Secondary | ICD-10-CM | POA: Diagnosis not present

## 2022-04-15 DIAGNOSIS — Z23 Encounter for immunization: Secondary | ICD-10-CM | POA: Diagnosis not present

## 2022-04-15 DIAGNOSIS — D631 Anemia in chronic kidney disease: Secondary | ICD-10-CM | POA: Diagnosis not present

## 2022-04-15 DIAGNOSIS — D509 Iron deficiency anemia, unspecified: Secondary | ICD-10-CM | POA: Diagnosis not present

## 2022-04-15 DIAGNOSIS — N2581 Secondary hyperparathyroidism of renal origin: Secondary | ICD-10-CM | POA: Diagnosis not present

## 2022-04-17 DIAGNOSIS — N2581 Secondary hyperparathyroidism of renal origin: Secondary | ICD-10-CM | POA: Diagnosis not present

## 2022-04-17 DIAGNOSIS — D631 Anemia in chronic kidney disease: Secondary | ICD-10-CM | POA: Diagnosis not present

## 2022-04-17 DIAGNOSIS — Z23 Encounter for immunization: Secondary | ICD-10-CM | POA: Diagnosis not present

## 2022-04-17 DIAGNOSIS — N186 End stage renal disease: Secondary | ICD-10-CM | POA: Diagnosis not present

## 2022-04-17 DIAGNOSIS — Z992 Dependence on renal dialysis: Secondary | ICD-10-CM | POA: Diagnosis not present

## 2022-04-17 DIAGNOSIS — D509 Iron deficiency anemia, unspecified: Secondary | ICD-10-CM | POA: Diagnosis not present

## 2022-04-20 DIAGNOSIS — N186 End stage renal disease: Secondary | ICD-10-CM | POA: Diagnosis not present

## 2022-04-20 DIAGNOSIS — Z23 Encounter for immunization: Secondary | ICD-10-CM | POA: Diagnosis not present

## 2022-04-20 DIAGNOSIS — D509 Iron deficiency anemia, unspecified: Secondary | ICD-10-CM | POA: Diagnosis not present

## 2022-04-20 DIAGNOSIS — Z992 Dependence on renal dialysis: Secondary | ICD-10-CM | POA: Diagnosis not present

## 2022-04-20 DIAGNOSIS — N2581 Secondary hyperparathyroidism of renal origin: Secondary | ICD-10-CM | POA: Diagnosis not present

## 2022-04-20 DIAGNOSIS — D631 Anemia in chronic kidney disease: Secondary | ICD-10-CM | POA: Diagnosis not present

## 2022-04-22 DIAGNOSIS — Z23 Encounter for immunization: Secondary | ICD-10-CM | POA: Diagnosis not present

## 2022-04-22 DIAGNOSIS — Z992 Dependence on renal dialysis: Secondary | ICD-10-CM | POA: Diagnosis not present

## 2022-04-22 DIAGNOSIS — N2581 Secondary hyperparathyroidism of renal origin: Secondary | ICD-10-CM | POA: Diagnosis not present

## 2022-04-22 DIAGNOSIS — N186 End stage renal disease: Secondary | ICD-10-CM | POA: Diagnosis not present

## 2022-04-22 DIAGNOSIS — D631 Anemia in chronic kidney disease: Secondary | ICD-10-CM | POA: Diagnosis not present

## 2022-04-22 DIAGNOSIS — D509 Iron deficiency anemia, unspecified: Secondary | ICD-10-CM | POA: Diagnosis not present

## 2022-04-24 DIAGNOSIS — Z23 Encounter for immunization: Secondary | ICD-10-CM | POA: Diagnosis not present

## 2022-04-24 DIAGNOSIS — D631 Anemia in chronic kidney disease: Secondary | ICD-10-CM | POA: Diagnosis not present

## 2022-04-24 DIAGNOSIS — N186 End stage renal disease: Secondary | ICD-10-CM | POA: Diagnosis not present

## 2022-04-24 DIAGNOSIS — Z992 Dependence on renal dialysis: Secondary | ICD-10-CM | POA: Diagnosis not present

## 2022-04-24 DIAGNOSIS — D509 Iron deficiency anemia, unspecified: Secondary | ICD-10-CM | POA: Diagnosis not present

## 2022-04-24 DIAGNOSIS — N2581 Secondary hyperparathyroidism of renal origin: Secondary | ICD-10-CM | POA: Diagnosis not present

## 2022-04-27 DIAGNOSIS — Z23 Encounter for immunization: Secondary | ICD-10-CM | POA: Diagnosis not present

## 2022-04-27 DIAGNOSIS — D631 Anemia in chronic kidney disease: Secondary | ICD-10-CM | POA: Diagnosis not present

## 2022-04-27 DIAGNOSIS — D509 Iron deficiency anemia, unspecified: Secondary | ICD-10-CM | POA: Diagnosis not present

## 2022-04-27 DIAGNOSIS — Z992 Dependence on renal dialysis: Secondary | ICD-10-CM | POA: Diagnosis not present

## 2022-04-27 DIAGNOSIS — N2581 Secondary hyperparathyroidism of renal origin: Secondary | ICD-10-CM | POA: Diagnosis not present

## 2022-04-27 DIAGNOSIS — N186 End stage renal disease: Secondary | ICD-10-CM | POA: Diagnosis not present

## 2022-04-29 DIAGNOSIS — Z992 Dependence on renal dialysis: Secondary | ICD-10-CM | POA: Diagnosis not present

## 2022-04-29 DIAGNOSIS — N2581 Secondary hyperparathyroidism of renal origin: Secondary | ICD-10-CM | POA: Diagnosis not present

## 2022-04-29 DIAGNOSIS — D509 Iron deficiency anemia, unspecified: Secondary | ICD-10-CM | POA: Diagnosis not present

## 2022-04-29 DIAGNOSIS — Z23 Encounter for immunization: Secondary | ICD-10-CM | POA: Diagnosis not present

## 2022-04-29 DIAGNOSIS — D631 Anemia in chronic kidney disease: Secondary | ICD-10-CM | POA: Diagnosis not present

## 2022-04-29 DIAGNOSIS — N186 End stage renal disease: Secondary | ICD-10-CM | POA: Diagnosis not present

## 2022-05-01 DIAGNOSIS — N186 End stage renal disease: Secondary | ICD-10-CM | POA: Diagnosis not present

## 2022-05-01 DIAGNOSIS — Z992 Dependence on renal dialysis: Secondary | ICD-10-CM | POA: Diagnosis not present

## 2022-05-01 DIAGNOSIS — Z23 Encounter for immunization: Secondary | ICD-10-CM | POA: Diagnosis not present

## 2022-05-01 DIAGNOSIS — D631 Anemia in chronic kidney disease: Secondary | ICD-10-CM | POA: Diagnosis not present

## 2022-05-01 DIAGNOSIS — D509 Iron deficiency anemia, unspecified: Secondary | ICD-10-CM | POA: Diagnosis not present

## 2022-05-01 DIAGNOSIS — N2581 Secondary hyperparathyroidism of renal origin: Secondary | ICD-10-CM | POA: Diagnosis not present

## 2022-05-04 DIAGNOSIS — D631 Anemia in chronic kidney disease: Secondary | ICD-10-CM | POA: Diagnosis not present

## 2022-05-04 DIAGNOSIS — N186 End stage renal disease: Secondary | ICD-10-CM | POA: Diagnosis not present

## 2022-05-04 DIAGNOSIS — D509 Iron deficiency anemia, unspecified: Secondary | ICD-10-CM | POA: Diagnosis not present

## 2022-05-04 DIAGNOSIS — N2581 Secondary hyperparathyroidism of renal origin: Secondary | ICD-10-CM | POA: Diagnosis not present

## 2022-05-04 DIAGNOSIS — Z992 Dependence on renal dialysis: Secondary | ICD-10-CM | POA: Diagnosis not present

## 2022-05-04 DIAGNOSIS — Z23 Encounter for immunization: Secondary | ICD-10-CM | POA: Diagnosis not present

## 2022-05-05 ENCOUNTER — Ambulatory Visit (INDEPENDENT_AMBULATORY_CARE_PROVIDER_SITE_OTHER): Payer: Medicare Other | Admitting: Gastroenterology

## 2022-05-05 ENCOUNTER — Other Ambulatory Visit: Payer: Self-pay

## 2022-05-05 ENCOUNTER — Encounter: Payer: Self-pay | Admitting: Gastroenterology

## 2022-05-05 VITALS — BP 124/70 | HR 93 | Ht 67.0 in | Wt 267.0 lb

## 2022-05-05 DIAGNOSIS — K5900 Constipation, unspecified: Secondary | ICD-10-CM

## 2022-05-05 DIAGNOSIS — I5042 Chronic combined systolic (congestive) and diastolic (congestive) heart failure: Secondary | ICD-10-CM

## 2022-05-05 DIAGNOSIS — N186 End stage renal disease: Secondary | ICD-10-CM | POA: Diagnosis not present

## 2022-05-05 DIAGNOSIS — K649 Unspecified hemorrhoids: Secondary | ICD-10-CM | POA: Diagnosis not present

## 2022-05-05 DIAGNOSIS — Z86010 Personal history of colonic polyps: Secondary | ICD-10-CM

## 2022-05-05 DIAGNOSIS — Z992 Dependence on renal dialysis: Secondary | ICD-10-CM | POA: Diagnosis not present

## 2022-05-05 DIAGNOSIS — I129 Hypertensive chronic kidney disease with stage 1 through stage 4 chronic kidney disease, or unspecified chronic kidney disease: Secondary | ICD-10-CM | POA: Diagnosis not present

## 2022-05-05 DIAGNOSIS — K625 Hemorrhage of anus and rectum: Secondary | ICD-10-CM | POA: Diagnosis not present

## 2022-05-05 MED ORDER — POLYETHYLENE GLYCOL 3350 17 G PO PACK
17.0000 g | PACK | Freq: Every day | ORAL | 0 refills | Status: DC
  Filled 2022-05-05: qty 14, 14d supply, fill #0

## 2022-05-05 MED ORDER — CALMOL-4 76-10 % RE SUPP: RECTAL | 0 refills | Status: DC

## 2022-05-05 NOTE — Progress Notes (Signed)
HPI :  61 year old male with history of CAD, ischemic cardiomyopathy with EF 23%, end-stage renal disease on HD, history of colon polyps, here to reestablish care for rectal bleeding, history of colon polyps, discussion for surveillance colonoscopy.  Patient states he has been on dialysis now for the past 15 months or so.  With dialysis he has to take "binders", which can constipate him.  He states with constipation he is developed some hemorrhoids and can notice some blood on the toilet paper when wiping himself and also on the toilet at times.  This is not with every bowel movement and can come and go.  Typically occurs if he is constipated.  He states this has been going on for several months now.  He has been using Dulcolax and stool softeners as needed which he states can help when he takes them but does not take anything routinely.  He states he has been stable from a cardiopulmonary perspective lately.  Denies any chest pains or shortness of breath.  Reports his conditions are well controlled.  He has been having routine blood work done at dialysis, he is not aware of any history of anemia etc.  He denies any family history of colon cancer.  He does use Pepcid as needed, does not have any routine heartburn or dysphagia that bothers him.  He eats well.  Weight stable.  His last colonoscopy as outlined below, he had 5 adenomas removed.  Also had hypertrophied anal papilla noted on retroflexion which biopsies showed no evidence of AIN.   Colonoscopy 02/01/2019: One 6 mm polyp in the cecum, removed with a cold snare. Resected and retrieved. - Three 4 to 5 mm polyps in the ascending colon, removed with a cold snare. Resected and retrieved. - One 3 mm polyp in the sigmoid colon, removed with a cold snare. Resected and retrieved. - Polypoid lesion at the anus. Biopsied. - Internal hemorrhoids. - The examination was otherwise normal.  1. Surgical [P], colon, ascending and cecum, sigmoid, polyp  (5) - TUBULAR ADENOMA(S) - NEGATIVE FOR HIGH-GRADE DYSPLASIA OR MALIGNANCY 2. Surgical [P], anal canal polyp - ANAL SQUAMOUS MUCOSA WITH REACTIVE CHANGES AND HYPERKERATOSIS - NO EVIDENCE OF MALIGNANCY   Echo 10/21/21: EF 20-30%, grade I DD   Past Medical History:  Diagnosis Date   AICD (automatic cardioverter/defibrillator) present    2015   Asthma    no meds   Cardiac arrest ([place]) 2015   CHF (congestive heart failure) ([facility])    Chronic kidney disease    ckd -stage 5   Coronary artery disease    Hypertension    Myocardial infarction ([place])    Obesity    Pneumonia    Pulmonary edema    Shortness of breath dyspnea    Sleep apnea    USES CPAP   Wears glasses      Past Surgical History:  Procedure Laterality Date   AV FISTULA PLACEMENT Right 03/15/2019   Procedure: RIGHT ARM ARTERIOVENOUS (AV) FISTULA CREATION;  Surgeon: Angelia Mould, MD;  Location: [place];  Service: Vascular;  Laterality: Right;   COLONOSCOPY     COLONOSCOPY W/ BIOPSIES AND POLYPECTOMY     CORONARY STENT PLACEMENT  2007   IMPLANTABLE CARDIOVERTER DEFIBRILLATOR IMPLANT  2015   RIGHT/LEFT HEART CATH AND CORONARY ANGIOGRAPHY N/A 07/08/2021   Procedure: RIGHT/LEFT HEART CATH AND CORONARY ANGIOGRAPHY;  Surgeon: Larey Dresser, MD;  Location: [place] [facility];  Service: Cardiovascular;  Laterality: N/A;   SUBQ  ICD CHANGEOUT N/A 10/13/2020   Procedure: SUBQ ICD CHANGEOUT;  Surgeon: Evans Lance, MD;  Location: [place] [facility];  Service: Cardiovascular;  Laterality: N/A;   Family History  Problem Relation Age of Onset   Hypertension Mother    Liver disease Father    Social History   Tobacco Use   Smoking status: Never   Smokeless tobacco: Never  Vaping Use   Vaping Use: Never used  Substance Use Topics   Alcohol use: No   Drug use: No   Current Outpatient Medications  Medication Sig Dispense Refill   albuterol (PROVENTIL) (2.5 MG/3ML) 0.083% nebulizer solution TAKE 3 MLS (2.5 MG  TOTAL) BY NEBULIZATION EVERY 6 (SIX) HOURS AS NEEDED FOR WHEEZING OR SHORTNESS OF BREATH. 75 mL 12   albuterol (VENTOLIN HFA) 108 (90 Base) MCG/ACT inhaler Inhale 2 puffs into the lungs every 4 (four) hours as needed for wheezing or shortness of breath. 8 g 6   aspirin EC 81 MG tablet Take 1 tablet (81 mg total) by mouth daily. 30 tablet 5   atorvastatin (LIPITOR) 40 MG tablet TAKE 1 TABLET (40 MG TOTAL) BY MOUTH AT BEDTIME. 30 tablet 2   AURYXIA 1 GM 210 MG(Fe) tablet Take 1 tablet (210 mg total) by mouth 3 (three) times daily. 270 tablet 1   calcium acetate (PHOSLO) 667 MG capsule Take 2 capsule by mouth three times a day with meals 180 capsule 6   carvedilol (COREG) 25 MG tablet TAKE 1 TABLET (25 MG TOTAL) BY MOUTH 2 (TWO) TIMES DAILY WITH A MEAL. 180 tablet 1   cetirizine (ZYRTEC) 10 MG tablet Take 1 tablet (10 mg total) by mouth 2 (two) times daily. 60 tablet 5   Cholecalciferol (VITAMIN D-3) 125 MCG (5000 UT) TABS Take 5,000 Units by mouth daily.     docusate sodium (COLACE) 100 MG capsule Take 1 capsule (100 mg total) by mouth 2 (two) times daily. 60 capsule 4   EPINEPHrine 0.3 mg/0.3 mL IJ SOAJ injection Inject 0.3 mg into the muscle once as needed for up to 2 doses (if worsening tongue swelling, SOB, hypoxia, or other concerns for progressive anaphylaxis). 2 each 2   ethyl chloride spray SPRAY A SMALL AMOUNT THREE TIMES A WEEK JUST PRIOR TO NEEDLE INSERTION 116 mL 11   famotidine (PEPCID) 20 MG tablet Take 1 tablet (20 mg total) by mouth 2 (two) times daily. 60 tablet 5   fluticasone (FLONASE) 50 MCG/ACT nasal spray Place into both nostrils as needed for allergies or rhinitis.     hydrALAZINE (APRESOLINE) 25 MG tablet Take 3 tablets (75 mg total) by mouth 3 (three) times daily. 270 tablet 11   isosorbide mononitrate (IMDUR) 60 MG 24 hr tablet Take 1 tablet (60 mg total) by mouth daily. 90 tablet 3   loratadine (CLARITIN) 10 MG tablet Take 10 mg by mouth daily as needed for allergies or  rhinitis.     Methoxy PEG-Epoetin Beta (MIRCERA IJ)      multivitamin (RENA-VIT) TABS tablet TAKE 1 TABLET BY MOUTH ONCE DAILY 30 tablet 11   sildenafil (VIAGRA) 50 MG tablet Take 1 tablet by mouth once a day as needed 30 min prior to sexual activity. 30 tablet 0   SYMBICORT 80-4.5 MCG/ACT inhaler INHALE TWO PUFFS INTO THE LUNGS TWICE DAILY (BULK) 10.2 g 6   No current facility-administered medications for this visit.   Allergies  Allergen Reactions   Ace Inhibitors Swelling    Swelling of the tongue  Influenza Vaccines Hives and Swelling    SWELLING REACTION UNSPECIFIED    Ketorolac Swelling    SWELLING REACTION UNSPECIFIED    Lidocaine Swelling    TONGUE SWELLS   Lisinopril Swelling    TONGUE SWELLING Pt reported problem with a BP med which sounded like  lisinopril  But as of 09/19/06,pt had tolerated altace without problem   Penicillins Swelling    TONGUE SWELLS Has patient had a PCN reaction causing immediate rash, facial/tongue/throat swelling, SOB or lightheadedness with hypotension: Yes Has patient had a PCN reaction causing severe rash involving mucus membranes or skin necrosis: No Has patient had a PCN reaction that required hospitalization No Has patient had a PCN reaction occurring within the last 10 years: Yes If all of the above answers are "NO", then may proceed with Cephalosporin use.      Review of Systems: All systems reviewed and negative except where noted in HPI.   Lab Results  Component Value Date   WBC 5.7 09/08/2021   HGB 12.0 (L) 09/08/2021   HCT 35.4 (L) 09/08/2021   MCV 95 09/08/2021   PLT 250 07/13/2021    Lab Results  Component Value Date   CREATININE 8.19 (H) 09/08/2021   BUN 25 (H) 09/08/2021   NA 142 09/08/2021   K 4.4 09/08/2021   CL 94 (L) 09/08/2021   CO2 28 09/08/2021    Lab Results  Component Value Date   ALT 31 09/08/2021   AST 23 09/08/2021   ALKPHOS 98 09/08/2021   BILITOT 0.3 09/08/2021    Physical Exam: BP  124/70 Comment: large cuff  Pulse 93   Ht '5\' 7"'$  (1.702 m)   Wt 267 lb (121.1 kg)   BMI 41.82 kg/m  Constitutional: Pleasant,well-developed, male in no acute distress. HEENT: Normocephalic and atraumatic. Conjunctivae are normal. No scleral icterus. Neck supple.  Cardiovascular: Normal rate, regular rhythm.  Pulmonary/chest: Effort normal and breath sounds normal.  Abdominal: Soft, nondistended, nontender.  There are no masses palpable.  DRE - no fissure obvious, hypertrophied anal papillae palpated. Hemorrhoids noted. Anoscopy attempted but patient did not tolerate due to discomfort- [place] standby. Extremities: no edema Lymphadenopathy: No cervical adenopathy noted. Neurological: Alert and oriented to person place and time. Skin: Skin is warm and dry. No rashes noted. Psychiatric: Normal mood and affect. Behavior is normal.   ASSESSMENT AND PLAN: 61 year old male here for reassessment of the following:  History of colon polyps Internal hemorrhoids /rectal bleeding Constipation CHF End-stage renal disease on HD  As above, patient is overdue for surveillance colonoscopy for his history of colon polyps.  He is now on dialysis and having some occasional constipation when taking binders during dialysis.  In the setting he has had some scant rectal bleeding he thinks is due to hemorrhoids.  On DRE he has hemorrhoids and palpated suspected hypertrophied papilla which were noted on his last colonoscopy and biopsied and benign.  I attempted anoscopy today but he did not tolerate it due to discomfort.  We discussed his course.  Suspect very likely hemorrhoidal bleeding is causing his symptoms in light of constipation.  Recommend he use MiraLAX daily and titrate up as needed to produce 1 soft bowel movement per daily.  He can try some Calmol 4 suppositories over-the-counter as needed to treat this.  He is due for a colonoscopy otherwise and we will further evaluate for polyps and his  bleeding symptoms as well.  Due to his comorbidities with EF less than  35% and being on dialysis, his case must be done at the hospital for anesthesia support.  We discussed risk benefits of colonoscopy and anesthesia and he wants to proceed.  Unfortunately we have a significant backlog of cases waiting to be done at the hospital due to limited access for anesthesia there for outpatients.  I do not think we have access to do his case there for another 3 to 4 months.  He will be added to a wait list and contacted as soon as an opening is available.  He is agreeable with this, if his bleeding gets worse he will contact me in the interim.  I will request labs from his nephrologist to clarify where his hemoglobin is trending etc, and iron studies if available.  He agrees with the plan, all questions answered.  Further recommendations pending his course and results of his procedure.  Jolly Mango, MD [facility]  CC: [name], MD

## 2022-05-05 NOTE — Patient Instructions (Signed)
If you are age 59 or older, your body mass index should be between 23-30. Your Body mass index is 41.82 kg/m. If this is out of the aforementioned range listed, please consider follow up with your Primary Care Provider.  If you are age 50 or younger, your body mass index should be between 19-25. Your Body mass index is 41.82 kg/m. If this is out of the aformentioned range listed, please consider follow up with your Primary Care Provider.   ________________________________________________________  [facility] providers would like to encourage you to use [facility] to communicate with providers for non-urgent requests or questions.  Due to long hold times on the telephone, sending your provider a message by [facility] may be a faster and more efficient way to get a response.  Please allow 48 business hours for a response.  Please remember that this is for non-urgent requests.  _______________________________________________________  We will add you to the wait list for a colonoscopy at the hospital. We will call you when we have an opening.  We will request your labs from Nephrology.  Please purchase the following medications over the counter and take as directed: Miralax: Take once daily and titrate up as needed Calmol 4 suppositories: Take as needed (you can buy these online if you can't find them locally)  Thank you for entrusting me with your care and for choosing  HealthCare, Dr.  Cellar

## 2022-05-06 DIAGNOSIS — Z992 Dependence on renal dialysis: Secondary | ICD-10-CM | POA: Diagnosis not present

## 2022-05-06 DIAGNOSIS — D509 Iron deficiency anemia, unspecified: Secondary | ICD-10-CM | POA: Diagnosis not present

## 2022-05-06 DIAGNOSIS — R52 Pain, unspecified: Secondary | ICD-10-CM | POA: Diagnosis not present

## 2022-05-06 DIAGNOSIS — N2581 Secondary hyperparathyroidism of renal origin: Secondary | ICD-10-CM | POA: Diagnosis not present

## 2022-05-06 DIAGNOSIS — Z23 Encounter for immunization: Secondary | ICD-10-CM | POA: Diagnosis not present

## 2022-05-06 DIAGNOSIS — D631 Anemia in chronic kidney disease: Secondary | ICD-10-CM | POA: Diagnosis not present

## 2022-05-06 DIAGNOSIS — N186 End stage renal disease: Secondary | ICD-10-CM | POA: Diagnosis not present

## 2022-05-08 DIAGNOSIS — N2581 Secondary hyperparathyroidism of renal origin: Secondary | ICD-10-CM | POA: Diagnosis not present

## 2022-05-08 DIAGNOSIS — N186 End stage renal disease: Secondary | ICD-10-CM | POA: Diagnosis not present

## 2022-05-08 DIAGNOSIS — D509 Iron deficiency anemia, unspecified: Secondary | ICD-10-CM | POA: Diagnosis not present

## 2022-05-08 DIAGNOSIS — D631 Anemia in chronic kidney disease: Secondary | ICD-10-CM | POA: Diagnosis not present

## 2022-05-08 DIAGNOSIS — R52 Pain, unspecified: Secondary | ICD-10-CM | POA: Diagnosis not present

## 2022-05-08 DIAGNOSIS — Z992 Dependence on renal dialysis: Secondary | ICD-10-CM | POA: Diagnosis not present

## 2022-05-08 DIAGNOSIS — Z23 Encounter for immunization: Secondary | ICD-10-CM | POA: Diagnosis not present

## 2022-05-11 DIAGNOSIS — Z992 Dependence on renal dialysis: Secondary | ICD-10-CM | POA: Diagnosis not present

## 2022-05-11 DIAGNOSIS — Z23 Encounter for immunization: Secondary | ICD-10-CM | POA: Diagnosis not present

## 2022-05-11 DIAGNOSIS — R52 Pain, unspecified: Secondary | ICD-10-CM | POA: Diagnosis not present

## 2022-05-11 DIAGNOSIS — N186 End stage renal disease: Secondary | ICD-10-CM | POA: Diagnosis not present

## 2022-05-11 DIAGNOSIS — D631 Anemia in chronic kidney disease: Secondary | ICD-10-CM | POA: Diagnosis not present

## 2022-05-11 DIAGNOSIS — N2581 Secondary hyperparathyroidism of renal origin: Secondary | ICD-10-CM | POA: Diagnosis not present

## 2022-05-11 DIAGNOSIS — D509 Iron deficiency anemia, unspecified: Secondary | ICD-10-CM | POA: Diagnosis not present

## 2022-05-13 ENCOUNTER — Ambulatory Visit: Payer: Medicare Other | Admitting: Allergy & Immunology

## 2022-05-13 DIAGNOSIS — Z23 Encounter for immunization: Secondary | ICD-10-CM | POA: Diagnosis not present

## 2022-05-13 DIAGNOSIS — R52 Pain, unspecified: Secondary | ICD-10-CM | POA: Diagnosis not present

## 2022-05-13 DIAGNOSIS — D631 Anemia in chronic kidney disease: Secondary | ICD-10-CM | POA: Diagnosis not present

## 2022-05-13 DIAGNOSIS — N186 End stage renal disease: Secondary | ICD-10-CM | POA: Diagnosis not present

## 2022-05-13 DIAGNOSIS — D509 Iron deficiency anemia, unspecified: Secondary | ICD-10-CM | POA: Diagnosis not present

## 2022-05-13 DIAGNOSIS — N2581 Secondary hyperparathyroidism of renal origin: Secondary | ICD-10-CM | POA: Diagnosis not present

## 2022-05-13 DIAGNOSIS — Z992 Dependence on renal dialysis: Secondary | ICD-10-CM | POA: Diagnosis not present

## 2022-05-15 DIAGNOSIS — Z23 Encounter for immunization: Secondary | ICD-10-CM | POA: Diagnosis not present

## 2022-05-15 DIAGNOSIS — D509 Iron deficiency anemia, unspecified: Secondary | ICD-10-CM | POA: Diagnosis not present

## 2022-05-15 DIAGNOSIS — D631 Anemia in chronic kidney disease: Secondary | ICD-10-CM | POA: Diagnosis not present

## 2022-05-15 DIAGNOSIS — N186 End stage renal disease: Secondary | ICD-10-CM | POA: Diagnosis not present

## 2022-05-15 DIAGNOSIS — Z992 Dependence on renal dialysis: Secondary | ICD-10-CM | POA: Diagnosis not present

## 2022-05-15 DIAGNOSIS — N2581 Secondary hyperparathyroidism of renal origin: Secondary | ICD-10-CM | POA: Diagnosis not present

## 2022-05-15 DIAGNOSIS — R52 Pain, unspecified: Secondary | ICD-10-CM | POA: Diagnosis not present

## 2022-05-18 ENCOUNTER — Ambulatory Visit (INDEPENDENT_AMBULATORY_CARE_PROVIDER_SITE_OTHER): Payer: Medicare Other | Admitting: Allergy & Immunology

## 2022-05-18 ENCOUNTER — Other Ambulatory Visit: Payer: Self-pay

## 2022-05-18 ENCOUNTER — Encounter: Payer: Self-pay | Admitting: Allergy & Immunology

## 2022-05-18 VITALS — BP 126/74 | HR 94 | Temp 97.9°F | Resp 18 | Ht 67.0 in | Wt 268.1 lb

## 2022-05-18 DIAGNOSIS — R52 Pain, unspecified: Secondary | ICD-10-CM | POA: Diagnosis not present

## 2022-05-18 DIAGNOSIS — J3089 Other allergic rhinitis: Secondary | ICD-10-CM

## 2022-05-18 DIAGNOSIS — N2581 Secondary hyperparathyroidism of renal origin: Secondary | ICD-10-CM | POA: Diagnosis not present

## 2022-05-18 DIAGNOSIS — D631 Anemia in chronic kidney disease: Secondary | ICD-10-CM | POA: Diagnosis not present

## 2022-05-18 DIAGNOSIS — J4540 Moderate persistent asthma, uncomplicated: Secondary | ICD-10-CM | POA: Diagnosis not present

## 2022-05-18 DIAGNOSIS — N186 End stage renal disease: Secondary | ICD-10-CM | POA: Diagnosis not present

## 2022-05-18 DIAGNOSIS — Z992 Dependence on renal dialysis: Secondary | ICD-10-CM | POA: Diagnosis not present

## 2022-05-18 DIAGNOSIS — D509 Iron deficiency anemia, unspecified: Secondary | ICD-10-CM | POA: Diagnosis not present

## 2022-05-18 DIAGNOSIS — Z23 Encounter for immunization: Secondary | ICD-10-CM | POA: Diagnosis not present

## 2022-05-18 DIAGNOSIS — T783XXD Angioneurotic edema, subsequent encounter: Secondary | ICD-10-CM

## 2022-05-18 NOTE — Progress Notes (Signed)
FOLLOW UP  Date of Service/Encounter:  05/18/22   Assessment:   Moderate persistent asthma, uncomplicated - with refusal to use any type of controller medication   Seasonal and perennial allergic rhinitis (grasses, weeds, and indoor molds)   Angioedema - unknown trigger   CKD - on dialysis three times weekly    Plan/Recommendations:   1. Seasonal and perennial allergic rhinitis - Previous testing showed: grasses, weeds, and indoor molds - Continue taking: Zyrtec (cetirizine) as below - You can use an extra dose of the antihistamine, if needed, for breakthrough symptoms.  - Consider nasal saline rinses 1-2 times daily to remove allergens from the nasal cavities as well as help with mucous clearance (this is especially helpful to do before the nasal sprays are given)  2.  Moderate persistent, uncomplicated - Lung testing was in the 50% range again, but since you do not want to take a daily inhaler, we are not going to worry about it.   3. Angioedema - unknown trigger - We are going to get some more labs, which can be drawn next time that you get hooked up to dialysis.  - Requisition printed off.  - In the meantime, continue suppressive dosing of antihistamines:   - Morning: Zyrtec (cetirizine) 10mg  (one tablet) + Pepcid (famotidine) 20mg   - Evening: NOTHING - You can change this dosing at home, decreasing the dose as needed or increasing the dosing as needed  4. Return in about 1 year (around 05/19/2023).   Subjective:   Victor Thompson is a 61 y.o. male presenting today for follow up of  Chief Complaint  Patient presents with   Follow-up    WILLAIM MODE has a history of the following: Patient Active Problem List   Diagnosis Date Noted   Seasonal and perennial allergic rhinitis 11/12/2021   Moderate persistent asthma, uncomplicated 16/09/9603   STD exposure 08/27/2021   ESRD on dialysis ([place]) 02/06/2021   Hypervolemia associated with renal insufficiency  01/12/2021   Angioedema 09/22/2020   Secondary hyperparathyroidism of renal origin ([place]) 07/15/2019   Vitamin D deficiency 07/15/2019   CAD (coronary artery disease), native coronary artery 04/10/2019   Chronic midline low back pain without sciatica 02/17/2018   VT (ventricular tachycardia) ([place]) 03/29/2016   Defibrillator discharge    Chronic combined systolic and diastolic CHF (congestive heart failure) ([place]) 01/22/2016   Erectile dysfunction 01/22/2016   Allergic rhinitis 01/22/2016   GERD (gastroesophageal reflux disease) 01/22/2016   PROTEINURIA 02/17/2010   Cardiomyopathy, ischemic 02/16/2010   Obstructive sleep apnea 02/16/2010   EXTERNAL HEMORRHOIDS 09/26/2009   Tobacco use disorder in remission 06/03/2009   Essential hypertension 10/01/2008   Hyperlipidemia 07/14/2007   Class 3 severe obesity due to excess calories with serious comorbidity and body mass index (BMI) of 40.0 to 44.9 in adult [facility]) 07/14/2007   Asthma 07/14/2007    History obtained from: chart review and patient.  Victor Thompson is a 61 y.o. male presenting for a follow up visit.  He was last seen in December 2022.  At that time, we started Zyrtec 10 mg daily.  His asthma was not under good control.  It was in the 50% range but improved to the low 60% range with albuterol.  We talked about starting a daily controller medication, but he was not interested in this at all.  His previous work-up was positive to alpha gal with a very low IgE.  His ESR was elevated as well which we have contributed to his  dialysis.  We continued him on suppressive antihistamines.  Since the last visit, he has done very well.   Asthma/Respiratory Symptom History: He denies coughing or wheezing. He is not using an inhaler at all. He does not wake up in the middle of the night without coughing.  He sees Dr. Annamaria Boots with his CPAP management.   Allergic Rhinitis Symptom History: Rhinitis symptoms are under good control with the Zyrtec 10 mg daily.   He has not had any antibiotics for any sinopulmonary infections since last visit.  Skin Symptom History: He has not had any angioedema episodes. He is doing one antihistamine daily. He has  not had an outbreak in a while.   He goes Tuesdays, Thursdays, and Saturdays for dialysis.  Otherwise, there have been no changes to his past medical history, surgical history, family history, or social history.    Review of Systems  Constitutional: Negative.  Negative for chills, fever, malaise/fatigue and weight loss.  HENT: Negative.  Negative for congestion, ear discharge, ear pain and sinus pain.        Positive for rhinorrhea.  Eyes:  Negative for pain, discharge and redness.  Respiratory:  Positive for cough. Negative for sputum production, shortness of breath and wheezing.   Cardiovascular: Negative.  Negative for chest pain and palpitations.  Gastrointestinal:  Negative for abdominal pain, constipation, diarrhea, heartburn, nausea and vomiting.  Skin: Negative.  Negative for itching and rash.  Neurological:  Negative for dizziness and headaches.  Endo/Heme/Allergies:  Positive for environmental allergies. Does not bruise/bleed easily.  All other systems reviewed and are negative.      Objective:   Blood pressure 126/74, pulse 94, temperature 97.9 F (36.6 C), resp. rate 18, height $RemoveBe'5\' 7"'TzUtrbPmV$  (1.702 m), weight 268 lb 2 oz (121.6 kg), SpO2 94 %. Body mass index is 41.99 kg/m.    Physical Exam Vitals reviewed.  Constitutional:      Appearance: He is well-developed. He is obese.  HENT:     Head: Normocephalic and atraumatic.     Right Ear: Tympanic membrane, ear canal and external ear normal. No drainage, swelling or tenderness. Tympanic membrane is not injected, scarred, erythematous, retracted or bulging.     Left Ear: Tympanic membrane, ear canal and external ear normal. No drainage, swelling or tenderness. Tympanic membrane is not injected, scarred, erythematous, retracted or bulging.      Nose: No nasal deformity, septal deviation, mucosal edema or rhinorrhea.     Right Turbinates: Enlarged, swollen and pale.     Left Turbinates: Enlarged, swollen and pale.     Right Sinus: No maxillary sinus tenderness or frontal sinus tenderness.     Left Sinus: No maxillary sinus tenderness or frontal sinus tenderness.     Mouth/Throat:     Mouth: Mucous membranes are not pale and not dry.     Pharynx: Uvula midline.  Eyes:     General:        Right eye: No discharge.        Left eye: No discharge.     Conjunctiva/sclera: Conjunctivae normal.     Right eye: Right conjunctiva is not injected. No chemosis.    Left eye: Left conjunctiva is not injected. No chemosis.    Pupils: Pupils are equal, round, and reactive to light.  Cardiovascular:     Rate and Rhythm: Normal rate and regular rhythm.     Heart sounds: Normal heart sounds.  Pulmonary:     Effort: Pulmonary effort is normal. No  tachypnea, accessory muscle usage or respiratory distress.     Breath sounds: No wheezing, rhonchi or rales.     Comments: Decreased air movement at the bases.  Chest:     Chest wall: No tenderness.  Abdominal:     Tenderness: There is no abdominal tenderness. There is no guarding or rebound.  Lymphadenopathy:     Head:     Right side of head: No submandibular, tonsillar or occipital adenopathy.     Left side of head: No submandibular, tonsillar or occipital adenopathy.     Cervical: No cervical adenopathy.  Skin:    General: Skin is warm.     Capillary Refill: Capillary refill takes less than 2 seconds.     Coloration: Skin is not pale.     Findings: No abrasion, erythema, petechiae or rash. Rash is not papular, urticarial or vesicular.  Neurological:     Mental Status: He is alert.  Psychiatric:        Behavior: Behavior is cooperative.      Diagnostic studies:    Spirometry: results abnormal (FEV1: 1.45/52%, FVC: 1.90/53%, FEV1/FVC: 76%).    Spirometry consistent with possible  restrictive disease.    Allergy Studies: none        Salvatore Marvel, MD  [facility] [place]

## 2022-05-18 NOTE — Patient Instructions (Addendum)
1. Seasonal and perennial allergic rhinitis - Previous testing showed: grasses, weeds, and indoor molds - Continue taking: Zyrtec (cetirizine) as below - You can use an extra dose of the antihistamine, if needed, for breakthrough symptoms.  - Consider nasal saline rinses 1-2 times daily to remove allergens from the nasal cavities as well as help with mucous clearance (this is especially helpful to do before the nasal sprays are given)  2.  Moderate persistent, uncomplicated - Lung testing was in the 50% range again, but since you do not want to take a daily inhaler, we are not going to worry about it.   3. Angioedema - unknown trigger - We are going to get some more labs, which can be drawn next time that you get hooked up to dialysis.  - Requisition printed off.  - In the meantime, continue suppressive dosing of antihistamines:   - Morning: Zyrtec (cetirizine) '10mg'$  (one tablet) + Pepcid (famotidine) '20mg'$   - Evening: NOTHING - You can change this dosing at home, decreasing the dose as needed or increasing the dosing as needed  4. Return in about 1 year (around 05/19/2023).    Please inform us of any Emergency Department visits, hospitalizations, or changes in symptoms. Call us before going to the ED for breathing or allergy symptoms since we might be able to fit you in for a sick visit. Feel free to contact us anytime with any questions, problems, or concerns.  It was a pleasure to see you again today!  Websites that have reliable patient information: 1. American Academy of Asthma, Allergy, and Immunology: www.aaaai.org 2. Food Allergy Research and Education (FARE): foodallergy.org 3. Mothers of Asthmatics: http://www.asthmacommunitynetwork.org 4. American College of Allergy, Asthma, and Immunology: www.acaai.org   COVID-19 Vaccine Information can be found at: [email or web address].[email or web address].uk For questions related to vaccine distribution or  appointments, please email [email or web address] or call [phone].   We realize that you might be concerned about having an allergic reaction to the COVID19 vaccines. To help with that concern, WE ARE OFFERING THE COVID19 VACCINES IN OUR OFFICE! Ask the front desk for dates!     "Like" [place] on Facebook and Instagram for our latest updates!      A healthy democracy works best when [employer] participate! Make sure you are registered to vote! If you have moved or changed any of your contact information, you will need to get this updated before voting!  In some cases, you MAY be able to register to vote online: [email or web address]

## 2022-05-20 DIAGNOSIS — N186 End stage renal disease: Secondary | ICD-10-CM | POA: Diagnosis not present

## 2022-05-20 DIAGNOSIS — Z992 Dependence on renal dialysis: Secondary | ICD-10-CM | POA: Diagnosis not present

## 2022-05-20 DIAGNOSIS — D631 Anemia in chronic kidney disease: Secondary | ICD-10-CM | POA: Diagnosis not present

## 2022-05-20 DIAGNOSIS — Z23 Encounter for immunization: Secondary | ICD-10-CM | POA: Diagnosis not present

## 2022-05-20 DIAGNOSIS — R52 Pain, unspecified: Secondary | ICD-10-CM | POA: Diagnosis not present

## 2022-05-20 DIAGNOSIS — D509 Iron deficiency anemia, unspecified: Secondary | ICD-10-CM | POA: Diagnosis not present

## 2022-05-20 DIAGNOSIS — N2581 Secondary hyperparathyroidism of renal origin: Secondary | ICD-10-CM | POA: Diagnosis not present

## 2022-05-22 DIAGNOSIS — D631 Anemia in chronic kidney disease: Secondary | ICD-10-CM | POA: Diagnosis not present

## 2022-05-22 DIAGNOSIS — R52 Pain, unspecified: Secondary | ICD-10-CM | POA: Diagnosis not present

## 2022-05-22 DIAGNOSIS — Z23 Encounter for immunization: Secondary | ICD-10-CM | POA: Diagnosis not present

## 2022-05-22 DIAGNOSIS — N2581 Secondary hyperparathyroidism of renal origin: Secondary | ICD-10-CM | POA: Diagnosis not present

## 2022-05-22 DIAGNOSIS — D509 Iron deficiency anemia, unspecified: Secondary | ICD-10-CM | POA: Diagnosis not present

## 2022-05-22 DIAGNOSIS — Z992 Dependence on renal dialysis: Secondary | ICD-10-CM | POA: Diagnosis not present

## 2022-05-22 DIAGNOSIS — N186 End stage renal disease: Secondary | ICD-10-CM | POA: Diagnosis not present

## 2022-05-25 ENCOUNTER — Inpatient Hospital Stay (HOSPITAL_COMMUNITY): Payer: Medicare Other

## 2022-05-25 ENCOUNTER — Encounter (HOSPITAL_COMMUNITY): Payer: Self-pay | Admitting: Emergency Medicine

## 2022-05-25 ENCOUNTER — Emergency Department (HOSPITAL_COMMUNITY): Payer: Medicare Other

## 2022-05-25 ENCOUNTER — Inpatient Hospital Stay (HOSPITAL_COMMUNITY)
Admission: EM | Admit: 2022-05-25 | Discharge: 2022-05-28 | DRG: 308 | Disposition: A | Discharge status: Home / Self Care | Payer: Medicare Other | Attending: Internal Medicine | Admitting: Internal Medicine

## 2022-05-25 ENCOUNTER — Other Ambulatory Visit: Payer: Self-pay

## 2022-05-25 DIAGNOSIS — Z992 Dependence on renal dialysis: Secondary | ICD-10-CM

## 2022-05-25 DIAGNOSIS — I472 Ventricular tachycardia, unspecified: Secondary | ICD-10-CM | POA: Diagnosis not present

## 2022-05-25 DIAGNOSIS — J4520 Mild intermittent asthma, uncomplicated: Secondary | ICD-10-CM | POA: Diagnosis not present

## 2022-05-25 DIAGNOSIS — N2581 Secondary hyperparathyroidism of renal origin: Secondary | ICD-10-CM | POA: Diagnosis present

## 2022-05-25 DIAGNOSIS — D509 Iron deficiency anemia, unspecified: Secondary | ICD-10-CM | POA: Diagnosis not present

## 2022-05-25 DIAGNOSIS — I2510 Atherosclerotic heart disease of native coronary artery without angina pectoris: Secondary | ICD-10-CM | POA: Diagnosis not present

## 2022-05-25 DIAGNOSIS — I120 Hypertensive chronic kidney disease with stage 5 chronic kidney disease or end stage renal disease: Secondary | ICD-10-CM | POA: Diagnosis not present

## 2022-05-25 DIAGNOSIS — Z95810 Presence of automatic (implantable) cardiac defibrillator: Secondary | ICD-10-CM

## 2022-05-25 DIAGNOSIS — Z23 Encounter for immunization: Secondary | ICD-10-CM | POA: Diagnosis not present

## 2022-05-25 DIAGNOSIS — D631 Anemia in chronic kidney disease: Secondary | ICD-10-CM | POA: Diagnosis present

## 2022-05-25 DIAGNOSIS — J9601 Acute respiratory failure with hypoxia: Secondary | ICD-10-CM | POA: Diagnosis not present

## 2022-05-25 DIAGNOSIS — R000 Tachycardia, unspecified: Secondary | ICD-10-CM | POA: Diagnosis not present

## 2022-05-25 DIAGNOSIS — Z888 Allergy status to other drugs, medicaments and biological substances status: Secondary | ICD-10-CM | POA: Diagnosis not present

## 2022-05-25 DIAGNOSIS — K219 Gastro-esophageal reflux disease without esophagitis: Secondary | ICD-10-CM | POA: Diagnosis present

## 2022-05-25 DIAGNOSIS — E785 Hyperlipidemia, unspecified: Secondary | ICD-10-CM | POA: Diagnosis present

## 2022-05-25 DIAGNOSIS — R079 Chest pain, unspecified: Secondary | ICD-10-CM | POA: Diagnosis present

## 2022-05-25 DIAGNOSIS — I5022 Chronic systolic (congestive) heart failure: Secondary | ICD-10-CM | POA: Diagnosis not present

## 2022-05-25 DIAGNOSIS — Z7951 Long term (current) use of inhaled steroids: Secondary | ICD-10-CM

## 2022-05-25 DIAGNOSIS — N186 End stage renal disease: Secondary | ICD-10-CM | POA: Diagnosis not present

## 2022-05-25 DIAGNOSIS — E875 Hyperkalemia: Secondary | ICD-10-CM | POA: Diagnosis not present

## 2022-05-25 DIAGNOSIS — I499 Cardiac arrhythmia, unspecified: Secondary | ICD-10-CM | POA: Diagnosis not present

## 2022-05-25 DIAGNOSIS — R001 Bradycardia, unspecified: Secondary | ICD-10-CM | POA: Diagnosis not present

## 2022-05-25 DIAGNOSIS — I252 Old myocardial infarction: Secondary | ICD-10-CM | POA: Diagnosis not present

## 2022-05-25 DIAGNOSIS — J9600 Acute respiratory failure, unspecified whether with hypoxia or hypercapnia: Secondary | ICD-10-CM

## 2022-05-25 DIAGNOSIS — I493 Ventricular premature depolarization: Secondary | ICD-10-CM | POA: Diagnosis not present

## 2022-05-25 DIAGNOSIS — I10 Essential (primary) hypertension: Secondary | ICD-10-CM | POA: Diagnosis not present

## 2022-05-25 DIAGNOSIS — Z8249 Family history of ischemic heart disease and other diseases of the circulatory system: Secondary | ICD-10-CM

## 2022-05-25 DIAGNOSIS — N250 Renal osteodystrophy: Secondary | ICD-10-CM | POA: Diagnosis not present

## 2022-05-25 DIAGNOSIS — R579 Shock, unspecified: Secondary | ICD-10-CM

## 2022-05-25 DIAGNOSIS — Z4502 Encounter for adjustment and management of automatic implantable cardiac defibrillator: Secondary | ICD-10-CM | POA: Diagnosis not present

## 2022-05-25 DIAGNOSIS — I255 Ischemic cardiomyopathy: Secondary | ICD-10-CM | POA: Diagnosis present

## 2022-05-25 DIAGNOSIS — M898X9 Other specified disorders of bone, unspecified site: Secondary | ICD-10-CM | POA: Diagnosis present

## 2022-05-25 DIAGNOSIS — G4733 Obstructive sleep apnea (adult) (pediatric): Secondary | ICD-10-CM | POA: Diagnosis present

## 2022-05-25 DIAGNOSIS — I5023 Acute on chronic systolic (congestive) heart failure: Secondary | ICD-10-CM

## 2022-05-25 DIAGNOSIS — Z87891 Personal history of nicotine dependence: Secondary | ICD-10-CM

## 2022-05-25 DIAGNOSIS — I5043 Acute on chronic combined systolic (congestive) and diastolic (congestive) heart failure: Secondary | ICD-10-CM | POA: Diagnosis present

## 2022-05-25 DIAGNOSIS — Z880 Allergy status to penicillin: Secondary | ICD-10-CM | POA: Diagnosis not present

## 2022-05-25 DIAGNOSIS — R52 Pain, unspecified: Secondary | ICD-10-CM | POA: Diagnosis not present

## 2022-05-25 DIAGNOSIS — I5042 Chronic combined systolic (congestive) and diastolic (congestive) heart failure: Secondary | ICD-10-CM | POA: Diagnosis not present

## 2022-05-25 DIAGNOSIS — R531 Weakness: Secondary | ICD-10-CM | POA: Diagnosis not present

## 2022-05-25 DIAGNOSIS — I132 Hypertensive heart and chronic kidney disease with heart failure and with stage 5 chronic kidney disease, or end stage renal disease: Secondary | ICD-10-CM | POA: Diagnosis present

## 2022-05-25 DIAGNOSIS — J811 Chronic pulmonary edema: Secondary | ICD-10-CM

## 2022-05-25 DIAGNOSIS — Z955 Presence of coronary angioplasty implant and graft: Secondary | ICD-10-CM

## 2022-05-25 DIAGNOSIS — Z6841 Body Mass Index (BMI) 40.0 and over, adult: Secondary | ICD-10-CM | POA: Diagnosis not present

## 2022-05-25 DIAGNOSIS — I5020 Unspecified systolic (congestive) heart failure: Secondary | ICD-10-CM | POA: Diagnosis not present

## 2022-05-25 DIAGNOSIS — Z7982 Long term (current) use of aspirin: Secondary | ICD-10-CM

## 2022-05-25 DIAGNOSIS — Z79899 Other long term (current) drug therapy: Secondary | ICD-10-CM

## 2022-05-25 DIAGNOSIS — R0602 Shortness of breath: Secondary | ICD-10-CM | POA: Diagnosis not present

## 2022-05-25 HISTORY — DX: Chest pain, unspecified: R07.9

## 2022-05-25 LAB — HEPATITIS C ANTIBODY: HCV Ab: NONREACTIVE

## 2022-05-25 LAB — CBC WITH DIFFERENTIAL/PLATELET
Abs Immature Granulocytes: 0.01 10*3/uL (ref 0.00–0.07)
Basophils Absolute: 0 10*3/uL (ref 0.0–0.1)
Basophils Relative: 0 %
Eosinophils Absolute: 0.2 10*3/uL (ref 0.0–0.5)
Eosinophils Relative: 3 %
HCT: 36 % — ABNORMAL LOW (ref 39.0–52.0)
Hemoglobin: 11.7 g/dL — ABNORMAL LOW (ref 13.0–17.0)
Immature Granulocytes: 0 %
Lymphocytes Relative: 12 %
Lymphs Abs: 0.7 10*3/uL (ref 0.7–4.0)
MCH: 32.9 pg (ref 26.0–34.0)
MCHC: 32.5 g/dL (ref 30.0–36.0)
MCV: 101.1 fL — ABNORMAL HIGH (ref 80.0–100.0)
Monocytes Absolute: 0.6 10*3/uL (ref 0.1–1.0)
Monocytes Relative: 11 %
Neutro Abs: 4.4 10*3/uL (ref 1.7–7.7)
Neutrophils Relative %: 74 %
Platelets: 199 10*3/uL (ref 150–400)
RBC: 3.56 MIL/uL — ABNORMAL LOW (ref 4.22–5.81)
RDW: 15.4 % (ref 11.5–15.5)
WBC: 6 10*3/uL (ref 4.0–10.5)
nRBC: 0 % (ref 0.0–0.2)

## 2022-05-25 LAB — BASIC METABOLIC PANEL
Anion gap: 21 — ABNORMAL HIGH (ref 5–15)
BUN: 51 mg/dL — ABNORMAL HIGH (ref 6–20)
CO2: 22 mmol/L (ref 22–32)
Calcium: 8.2 mg/dL — ABNORMAL LOW (ref 8.9–10.3)
Chloride: 94 mmol/L — ABNORMAL LOW (ref 98–111)
Creatinine, Ser: 13.72 mg/dL — ABNORMAL HIGH (ref 0.61–1.24)
GFR, Estimated: 4 mL/min — ABNORMAL LOW (ref >60)
Glucose, Bld: 282 mg/dL — ABNORMAL HIGH (ref 70–99)
Potassium: 5.5 mmol/L — ABNORMAL HIGH (ref 3.5–5.1)
Sodium: 137 mmol/L (ref 135–145)

## 2022-05-25 LAB — COMPREHENSIVE METABOLIC PANEL
ALT: 18 U/L (ref 0–44)
AST: 17 U/L (ref 15–41)
Albumin: 3.3 g/dL — ABNORMAL LOW (ref 3.5–5.0)
Alkaline Phosphatase: 59 U/L (ref 38–126)
Anion gap: 17 — ABNORMAL HIGH (ref 5–15)
BUN: 46 mg/dL — ABNORMAL HIGH (ref 6–20)
CO2: 30 mmol/L (ref 22–32)
Calcium: 8.2 mg/dL — ABNORMAL LOW (ref 8.9–10.3)
Chloride: 94 mmol/L — ABNORMAL LOW (ref 98–111)
Creatinine, Ser: 12.46 mg/dL — ABNORMAL HIGH (ref 0.61–1.24)
GFR, Estimated: 4 mL/min — ABNORMAL LOW (ref >60)
Glucose, Bld: 128 mg/dL — ABNORMAL HIGH (ref 70–99)
Potassium: 4.2 mmol/L (ref 3.5–5.1)
Sodium: 141 mmol/L (ref 135–145)
Total Bilirubin: 0.7 mg/dL (ref 0.3–1.2)
Total Protein: 7.3 g/dL (ref 6.5–8.1)

## 2022-05-25 LAB — RENAL FUNCTION PANEL
Albumin: 3.2 g/dL — ABNORMAL LOW (ref 3.5–5.0)
Anion gap: 20 — ABNORMAL HIGH (ref 5–15)
BUN: 52 mg/dL — ABNORMAL HIGH (ref 6–20)
CO2: 23 mmol/L (ref 22–32)
Calcium: 8.3 mg/dL — ABNORMAL LOW (ref 8.9–10.3)
Chloride: 94 mmol/L — ABNORMAL LOW (ref 98–111)
Creatinine, Ser: 13.53 mg/dL — ABNORMAL HIGH (ref 0.61–1.24)
GFR, Estimated: 4 mL/min — ABNORMAL LOW (ref >60)
Glucose, Bld: 283 mg/dL — ABNORMAL HIGH (ref 70–99)
Phosphorus: 8 mg/dL — ABNORMAL HIGH (ref 2.5–4.6)
Potassium: 5.6 mmol/L — ABNORMAL HIGH (ref 3.5–5.1)
Sodium: 137 mmol/L (ref 135–145)

## 2022-05-25 LAB — I-STAT CHEM 8, ED
BUN: 43 mg/dL — ABNORMAL HIGH (ref 6–20)
Calcium, Ion: 0.92 mmol/L — ABNORMAL LOW (ref 1.15–1.40)
Chloride: 97 mmol/L — ABNORMAL LOW (ref 98–111)
Creatinine, Ser: 13.8 mg/dL — ABNORMAL HIGH (ref 0.61–1.24)
Glucose, Bld: 113 mg/dL — ABNORMAL HIGH (ref 70–99)
HCT: 35 % — ABNORMAL LOW (ref 39.0–52.0)
Hemoglobin: 11.9 g/dL — ABNORMAL LOW (ref 13.0–17.0)
Potassium: 4 mmol/L (ref 3.5–5.1)
Sodium: 139 mmol/L (ref 135–145)
TCO2: 28 mmol/L (ref 22–32)

## 2022-05-25 LAB — PROTIME-INR
INR: 1.1 (ref 0.8–1.2)
Prothrombin Time: 14 seconds (ref 11.4–15.2)

## 2022-05-25 LAB — LACTIC ACID, PLASMA
Lactic Acid, Venous: 1.9 mmol/L (ref 0.5–1.9)
Lactic Acid, Venous: 4.1 mmol/L (ref 0.5–1.9)

## 2022-05-25 LAB — HEPATITIS B SURFACE ANTIBODY,QUALITATIVE: Hep B S Ab: REACTIVE — AB

## 2022-05-25 LAB — HEPATITIS B CORE ANTIBODY, TOTAL: Hep B Core Total Ab: NONREACTIVE

## 2022-05-25 LAB — GLUCOSE, CAPILLARY: Glucose-Capillary: 146 mg/dL — ABNORMAL HIGH (ref 70–99)

## 2022-05-25 LAB — TROPONIN I (HIGH SENSITIVITY)
Troponin I (High Sensitivity): 72 ng/L — ABNORMAL HIGH (ref <18)
Troponin I (High Sensitivity): 77 ng/L — ABNORMAL HIGH (ref <18)
Troponin I (High Sensitivity): 73 ng/L — ABNORMAL HIGH (ref <18)
Troponin I (High Sensitivity): 78 ng/L — ABNORMAL HIGH (ref <18)

## 2022-05-25 LAB — APTT: aPTT: 27 seconds (ref 24–36)

## 2022-05-25 LAB — MAGNESIUM
Magnesium: 2.6 mg/dL — ABNORMAL HIGH (ref 1.7–2.4)
Magnesium: 2.5 mg/dL — ABNORMAL HIGH (ref 1.7–2.4)

## 2022-05-25 LAB — HEPATITIS B SURFACE ANTIGEN: Hepatitis B Surface Ag: NONREACTIVE

## 2022-05-25 LAB — AMYLASE: Amylase: 91 U/L (ref 28–100)

## 2022-05-25 LAB — LIPASE, BLOOD: Lipase: 35 U/L (ref 11–51)

## 2022-05-25 LAB — MRSA NEXT GEN BY PCR, NASAL: MRSA by PCR Next Gen: NOT DETECTED

## 2022-05-25 LAB — BRAIN NATRIURETIC PEPTIDE: B Natriuretic Peptide: 1355.4 pg/mL — ABNORMAL HIGH (ref 0.0–100.0)

## 2022-05-25 LAB — PHOSPHORUS: Phosphorus: 7.9 mg/dL — ABNORMAL HIGH (ref 2.5–4.6)

## 2022-05-25 LAB — PROCALCITONIN: Procalcitonin: 0.52 ng/mL

## 2022-05-25 MED ORDER — RANOLAZINE ER 500 MG PO TB12
500.0000 mg | ORAL_TABLET | Freq: Two times a day (BID) | ORAL | Status: DC
  Administered 2022-05-25 – 2022-05-28 (×6): 500 mg via ORAL
  Filled 2022-05-25 (×6): qty 1

## 2022-05-25 MED ORDER — DEXTROSE 50 % IV SOLN
1.0000 | Freq: Once | INTRAVENOUS | Status: AC
  Administered 2022-05-25: 50 mL via INTRAVENOUS
  Filled 2022-05-25: qty 50

## 2022-05-25 MED ORDER — AMIODARONE IV BOLUS ONLY 150 MG/100ML
150.0000 mg | Freq: Once | INTRAVENOUS | Status: DC
Start: 2022-05-25 — End: 2022-05-25

## 2022-05-25 MED ORDER — PHENYLEPHRINE HCL-NACL 20-0.9 MG/250ML-% IV SOLN
25.0000 ug/min | INTRAVENOUS | Status: DC
  Administered 2022-05-25: 25 ug/min via INTRAVENOUS
  Filled 2022-05-25: qty 250

## 2022-05-25 MED ORDER — HEPARIN SODIUM (PORCINE) 5000 UNIT/ML IJ SOLN
5000.0000 [IU] | Freq: Three times a day (TID) | INTRAMUSCULAR | Status: DC
  Administered 2022-05-25 – 2022-05-28 (×6): 5000 [IU] via SUBCUTANEOUS
  Filled 2022-05-25 (×7): qty 1

## 2022-05-25 MED ORDER — AMIODARONE IV BOLUS ONLY 150 MG/100ML
150.0000 mg | Freq: Once | INTRAVENOUS | Status: AC
  Administered 2022-05-25: 150 mg via INTRAVENOUS
  Filled 2022-05-25: qty 100

## 2022-05-25 MED ORDER — METOPROLOL TARTRATE 5 MG/5ML IV SOLN
2.5000 mg | Freq: Once | INTRAVENOUS | Status: AC
Start: 2022-05-25 — End: 2022-05-25
  Administered 2022-05-25: 2.5 mg via INTRAVENOUS
  Filled 2022-05-25: qty 5

## 2022-05-25 MED ORDER — HYDROMORPHONE HCL 1 MG/ML IJ SOLN
0.5000 mg | Freq: Once | INTRAMUSCULAR | Status: AC
  Administered 2022-05-25: 0.5 mg via INTRAVENOUS
  Filled 2022-05-25: qty 0.5

## 2022-05-25 MED ORDER — SODIUM CHLORIDE 0.9 % IV SOLN: INTRAVENOUS | Status: DC

## 2022-05-25 MED ORDER — HEPARIN SOD (PORK) LOCK FLUSH 100 UNIT/ML IV SOLN: 500.0000 [IU] | INTRAVENOUS | Status: DC | PRN

## 2022-05-25 MED ORDER — AMIODARONE HCL IN DEXTROSE 360-4.14 MG/200ML-% IV SOLN
60.0000 mg/h | INTRAVENOUS | Status: DC
  Administered 2022-05-25 – 2022-05-26 (×5): 60 mg/h via INTRAVENOUS
  Administered 2022-05-26: 30 mg/h via INTRAVENOUS
  Administered 2022-05-26 – 2022-05-27 (×3): 60 mg/h via INTRAVENOUS
  Filled 2022-05-25 (×8): qty 200

## 2022-05-25 MED ORDER — DOCUSATE SODIUM 100 MG PO CAPS: 100.0000 mg | ORAL_CAPSULE | Freq: Two times a day (BID) | ORAL | Status: DC | PRN

## 2022-05-25 MED ORDER — AMIODARONE HCL IN DEXTROSE 360-4.14 MG/200ML-% IV SOLN: 30.0000 mg/h | INTRAVENOUS | Status: DC

## 2022-05-25 MED ORDER — AMIODARONE LOAD VIA INFUSION
150.0000 mg | Freq: Once | INTRAVENOUS | Status: AC
  Administered 2022-05-25: 150 mg via INTRAVENOUS
  Filled 2022-05-25: qty 83.34

## 2022-05-25 MED ORDER — CALCIUM CHLORIDE 10 % IV SOLN
INTRAVENOUS | Status: AC
  Filled 2022-05-25: qty 10

## 2022-05-25 MED ORDER — ALBUTEROL SULFATE (2.5 MG/3ML) 0.083% IN NEBU
2.5000 mg | INHALATION_SOLUTION | RESPIRATORY_TRACT | Status: DC | PRN
  Administered 2022-05-26: 2.5 mg via RESPIRATORY_TRACT
  Filled 2022-05-25: qty 3

## 2022-05-25 MED ORDER — AMIODARONE LOAD VIA INFUSION: 150.0000 mg | Freq: Once | INTRAVENOUS | Status: DC

## 2022-05-25 MED ORDER — SODIUM BICARBONATE 8.4 % IV SOLN: 50.0000 meq | Freq: Once | INTRAVENOUS | Status: AC

## 2022-05-25 MED ORDER — AMIODARONE LOAD VIA INFUSION
150.0000 mg | Freq: Once | INTRAVENOUS | Status: AC
  Administered 2022-05-25: 150 mg via INTRAVENOUS

## 2022-05-25 MED ORDER — CHLORHEXIDINE GLUCONATE CLOTH 2 % EX PADS: 6.0000 | MEDICATED_PAD | Freq: Every day | CUTANEOUS | Status: DC

## 2022-05-25 MED ORDER — PANTOPRAZOLE SODIUM 40 MG IV SOLR
40.0000 mg | Freq: Every day | INTRAVENOUS | Status: DC
  Administered 2022-05-25 – 2022-05-26 (×2): 40 mg via INTRAVENOUS
  Filled 2022-05-25 (×3): qty 10

## 2022-05-25 MED ORDER — ALBUTEROL SULFATE (2.5 MG/3ML) 0.083% IN NEBU
2.5000 mg | INHALATION_SOLUTION | RESPIRATORY_TRACT | Status: DC
  Administered 2022-05-25 – 2022-05-26 (×6): 2.5 mg via RESPIRATORY_TRACT
  Filled 2022-05-25 (×6): qty 3

## 2022-05-25 MED ORDER — INSULIN ASPART 100 UNIT/ML IV SOLN
5.0000 [IU] | Freq: Once | INTRAVENOUS | Status: AC
  Administered 2022-05-25: 5 [IU] via INTRAVENOUS

## 2022-05-25 MED ORDER — ACETAMINOPHEN 325 MG PO TABS
650.0000 mg | ORAL_TABLET | Freq: Four times a day (QID) | ORAL | Status: DC | PRN
  Administered 2022-05-25: 650 mg via ORAL
  Filled 2022-05-25: qty 2

## 2022-05-25 MED ORDER — LIDOCAINE HCL (PF) 1 % IJ SOLN
INTRAMUSCULAR | Status: AC
  Filled 2022-05-25: qty 5

## 2022-05-25 MED ORDER — LORAZEPAM 2 MG/ML IJ SOLN
0.5000 mg | Freq: Once | INTRAMUSCULAR | Status: AC
  Administered 2022-05-25: 0.5 mg via INTRAVENOUS
  Filled 2022-05-25: qty 1

## 2022-05-25 MED ORDER — HEPARIN SODIUM (PORCINE) 1000 UNIT/ML IJ SOLN
INTRAMUSCULAR | Status: AC
  Filled 2022-05-25: qty 3

## 2022-05-25 MED ORDER — AMIODARONE HCL IN DEXTROSE 360-4.14 MG/200ML-% IV SOLN
60.0000 mg/h | INTRAVENOUS | Status: DC
  Administered 2022-05-25: 60 mg/h via INTRAVENOUS
  Filled 2022-05-25 (×2): qty 200

## 2022-05-25 MED ORDER — AMIODARONE IV BOLUS ONLY 150 MG/100ML: 150.0000 mg | Freq: Once | INTRAVENOUS | Status: DC

## 2022-05-25 MED ORDER — CALCIUM GLUCONATE-NACL 1-0.675 GM/50ML-% IV SOLN
1.0000 g | Freq: Once | INTRAVENOUS | Status: AC
Start: 2022-05-25 — End: 2022-05-25

## 2022-05-25 MED ORDER — SODIUM CHLORIDE 0.9 % IV SOLN
250.0000 mL | INTRAVENOUS | Status: DC
  Administered 2022-05-25: 500 mL via INTRAVENOUS

## 2022-05-25 MED ORDER — POLYETHYLENE GLYCOL 3350 17 G PO PACK: 17.0000 g | PACK | Freq: Every day | ORAL | Status: DC | PRN

## 2022-05-25 MED ORDER — SODIUM BICARBONATE 8.4 % IV SOLN
INTRAVENOUS | Status: AC
  Administered 2022-05-25: 50 meq via INTRAVENOUS
  Filled 2022-05-25: qty 100

## 2022-05-25 MED ORDER — METOPROLOL TARTRATE 5 MG/5ML IV SOLN
2.5000 mg | INTRAVENOUS | Status: DC | PRN
  Administered 2022-05-25: 2.5 mg via INTRAVENOUS
  Filled 2022-05-25: qty 5

## 2022-05-25 MED ORDER — ORAL CARE MOUTH RINSE: 15.0000 mL | OROMUCOSAL | Status: DC | PRN

## 2022-05-25 MED ORDER — CALCIUM GLUCONATE-NACL 1-0.675 GM/50ML-% IV SOLN
INTRAVENOUS | Status: AC
  Administered 2022-05-25: 1000 mg via INTRAVENOUS
  Filled 2022-05-25: qty 50

## 2022-05-25 NOTE — ED Notes (Signed)
Dr. Laverta Baltimore request patient stay in his shirt to keep fistula access.

## 2022-05-25 NOTE — ED Notes (Signed)
Pt refused to have fistula deaccessed

## 2022-05-25 NOTE — Procedures (Signed)
Arterial Catheter Insertion Procedure Note  TEDD COTTRILL  [id number]  10-18-1961  Date:05/25/22  Time:8:02 PM    Provider Performing: Otilio Carpen Nilda Keathley    Procedure: Insertion of Arterial Line [phone]) with US guidance ([id number])   Indication(s) Blood pressure monitoring and/or need for frequent ABGs  Consent Risks of the procedure as well as the alternatives and risks of each were explained to the patient and/or caregiver.  Consent for the procedure was obtained and is signed in the bedside chart  Anesthesia None   Time Out Verified patient identification, verified procedure, site/side was marked, verified correct patient position, special equipment/implants available, medications/allergies/relevant history reviewed, required imaging and test results available.   Sterile Technique Maximal sterile technique including full sterile barrier drape, hand hygiene, sterile gown, sterile gloves, mask, hair covering, sterile ultrasound probe cover (if used).   Procedure Description Area of catheter insertion was cleaned with chlorhexidine and draped in sterile fashion. With real-time ultrasound guidance an arterial catheter was placed into the left radial artery.  Appropriate arterial tracings confirmed on monitor.     Complications/Tolerance None; patient tolerated the procedure well.   EBL Minimal   Specimen(s) None   Otilio Carpen Naiomy Watters, PA-C

## 2022-05-25 NOTE — ED Provider Notes (Signed)
[facility] EMERGENCY DEPARTMENT Provider Note   CSN: [id number] Arrival date & time: 05/25/22  7026     History  Chief Complaint  Patient presents with   Victor Thompson is a 61 y.o. male.  HPI  61 year old male with medical history significant for CAD, HTN, cardiac arrest, CHF  (last EF in November 2022 25 to 30%), AICD in place, ESRD on HD, VT who presents to the emergency department with concern for multiple episodes of nonsustained VT and his ICD firing.  The patient states that he felt off yesterday with some improvement on sitting up.  He felt as if his ICD was going to fire this morning and subsequently felt a shock him 1 time while he was at dialysis.  EMS was called and reported initial bradycardia and then subsequent runs of V. tach.  He was administered 300 of amnio and 50 mEq of sodium bicarb through access via his dialysis fistula.  The patient states that he has had no chest pain or shortness of breath but has been feeling a fluttering sensation of palpitations in his chest and a sensation of malaise for the last 24 hours.  Patient is hemodynamically stable with EMS with blood pressure 140/100.  He arrived to the emergency department with intermittent runs of nonsustained V. tach, status post amiodarone bolus.  Ultrasound-guided IV access was obtained and the patient's [employer] pacemaker rep was contacted. Cardiology consulted on arrival. The patient remained hemodynamically stable with intermittent runs on nonsustained vtach on telemetry.  Home Medications Prior to Admission medications   Medication Sig Start Date End Date Taking? Authorizing Provider  albuterol (PROVENTIL) (2.5 MG/3ML) 0.083% nebulizer solution TAKE 3 MLS (2.5 MG TOTAL) BY NEBULIZATION EVERY 6 (SIX) HOURS AS NEEDED FOR WHEEZING OR SHORTNESS OF BREATH. 12/30/21 12/30/22 Yes [name], MD  albuterol (VENTOLIN HFA) 108 (90 Base) MCG/ACT inhaler Inhale 2 puffs into the lungs  every 4 (four) hours as needed for wheezing or shortness of breath. 10/01/19  Yes [name], MD  aspirin EC 81 MG tablet Take 1 tablet (81 mg total) by mouth daily. 01/22/16  Yes [name], Josalyn, MD  atorvastatin (LIPITOR) 40 MG tablet TAKE 1 TABLET (40 MG TOTAL) BY MOUTH AT BEDTIME. 07/09/21 07/09/22 Yes [name], MD  AURYXIA 1 GM 210 MG(Fe) tablet Take 1 tablet (210 mg total) by mouth 3 (three) times daily. 08/12/21  Yes [name], MD  calcium acetate (PHOSLO) 667 MG capsule Take 2 capsule by mouth three times a day with meals 06/09/21  Yes   carvedilol (COREG) 25 MG tablet TAKE 1 TABLET (25 MG TOTAL) BY MOUTH 2 (TWO) TIMES DAILY WITH A MEAL. 12/01/21 12/01/22 Yes [name], MD  cetirizine (ZYRTEC) 10 MG tablet Take 1 tablet (10 mg total) by mouth 2 (two) times daily. 11/12/21  Yes Valentina Shaggy, MD  Cholecalciferol (VITAMIN D-3) 125 MCG (5000 UT) TABS Take 5,000 Units by mouth daily.   Yes [provider]  docusate sodium (COLACE) 100 MG capsule Take 1 capsule (100 mg total) by mouth 2 (two) times daily. 02/15/22  Yes [name], MD  famotidine (PEPCID) 20 MG tablet Take 1 tablet (20 mg total) by mouth 2 (two) times daily. 11/12/21  Yes Valentina Shaggy, MD  fluticasone [facility]) 50 MCG/ACT nasal spray Place into both nostrils as needed for allergies or rhinitis.   Yes [provider]  hydrALAZINE (APRESOLINE) 25 MG tablet  Take 3 tablets (75 mg total) by mouth 3 (three) times daily. 12/15/21  Yes Larey Dresser, MD  isosorbide mononitrate (IMDUR) 60 MG 24 hr tablet Take 1 tablet (60 mg total) by mouth daily. 12/15/21 12/15/22 Yes Larey Dresser, MD  polyethylene glycol (MIRALAX) 17 g packet Take 17 g by mouth daily. Titrate up as needed 05/05/22  Yes Armbruster, Carlota Raspberry, MD  sildenafil (VIAGRA) 50 MG tablet Take 1 tablet by mouth once a day as needed 30 min prior to sexual activity. 06/16/21  Yes   SYMBICORT 80-4.5 MCG/ACT inhaler  INHALE TWO PUFFS INTO THE LUNGS TWICE DAILY (BULK) 03/10/22  Yes [name], MD  EPINEPHrine 0.3 mg/0.3 mL IJ SOAJ injection Inject 0.3 mg into the muscle once as needed for up to 2 doses (if worsening tongue swelling, SOB, hypoxia, or other concerns for progressive anaphylaxis). Patient not taking: Reported on 05/25/2022 07/15/21   [name], MD  ethyl chloride spray SPRAY A SMALL AMOUNT THREE TIMES A WEEK JUST PRIOR TO NEEDLE INSERTION 10/26/21   [name], MD  Methoxy PEG-Epoetin Beta (MIRCERA IJ)  08/04/21 08/03/22  [provider]  Rectal Protectant-Emollient (CALMOL-4) 76-10 % SUPP Use as directed once to twice daily 05/05/22   Armbruster, Carlota Raspberry, MD      Allergies    Ace inhibitors, Influenza vaccines, Ketorolac, Lidocaine, Lisinopril, and Penicillins    Review of Systems   Review of Systems  Constitutional:  Positive for fatigue.  Cardiovascular:  Positive for palpitations.  All other systems reviewed and are negative.   Physical Exam Updated Vital Signs BP 98/66   Pulse 83   Temp 97.8 F (36.6 C) (Oral)   Resp (!) 22   Ht '5\' 7"'$  (1.702 m)   Wt 121.6 kg   SpO2 100%   BMI 41.99 kg/m  Physical Exam Vitals and nursing note reviewed.  Constitutional:      General: He is not in acute distress.    Appearance: He is well-developed. He is obese. He is not ill-appearing.  HENT:     Head: Normocephalic and atraumatic.  Eyes:     Conjunctiva/sclera: Conjunctivae normal.  Cardiovascular:     Rate and Rhythm: Normal rate and regular rhythm.  Pulmonary:     Effort: Pulmonary effort is normal. No respiratory distress.     Breath sounds: Normal breath sounds.  Abdominal:     Palpations: Abdomen is soft.     Tenderness: There is no abdominal tenderness.  Musculoskeletal:        General: No swelling.     Cervical back: Neck supple.     Right lower leg: No edema.     Left lower leg: No edema.     Comments: Right upper extremity dialysis fistula in  place, accessed by EMS for medication administration  Skin:    General: Skin is warm and dry.     Capillary Refill: Capillary refill takes less than 2 seconds.  Neurological:     General: No focal deficit present.     Mental Status: He is alert and oriented to person, place, and time. Mental status is at baseline.  Psychiatric:        Mood and Affect: Mood normal.     ED Results / Procedures / Treatments   Labs (all labs ordered are listed, but only abnormal results are displayed) Labs Reviewed  CBC WITH DIFFERENTIAL/PLATELET - Abnormal; Notable for the following components:      Result Value  RBC 3.56 (*)    Hemoglobin 11.7 (*)    HCT 36.0 (*)    MCV 101.1 (*)    All other components within normal limits  COMPREHENSIVE METABOLIC PANEL - Abnormal; Notable for the following components:   Chloride 94 (*)    Glucose, Bld 128 (*)    BUN 46 (*)    Creatinine, Ser 12.46 (*)    Calcium 8.2 (*)    Albumin 3.3 (*)    GFR, Estimated 4 (*)    Anion gap 17 (*)    All other components within normal limits  BRAIN NATRIURETIC PEPTIDE - Abnormal; Notable for the following components:   B Natriuretic Peptide 1,355.4 (*)    All other components within normal limits  MAGNESIUM - Abnormal; Notable for the following components:   Magnesium 2.6 (*)    All other components within normal limits  I-STAT CHEM 8, ED - Abnormal; Notable for the following components:   Chloride 97 (*)    BUN 43 (*)    Creatinine, Ser 13.80 (*)    Glucose, Bld 113 (*)    Calcium, Ion 0.92 (*)    Hemoglobin 11.9 (*)    HCT 35.0 (*)    All other components within normal limits  TROPONIN I (HIGH SENSITIVITY) - Abnormal; Notable for the following components:   Troponin I (High Sensitivity) 72 (*)    All other components within normal limits  TROPONIN I (HIGH SENSITIVITY) - Abnormal; Notable for the following components:   Troponin I (High Sensitivity) 77 (*)    All other components within normal limits  CBG  MONITORING, ED    EKG EKG Interpretation  Date/Time:  Tuesday May 25 2022 06:59:17 EDT Ventricular Rate:  151 PR Interval:    QRS Duration: 142 QT Interval:  308 QTC Calculation: 375 R Axis:   105 Text Interpretation: Ventricular tachycardia, unsustained Nonspecific intraventricular conduction delay Probable lateral infarct, age indeterminate Anteroseptal infarct, old Confirmed by Regan Lemming ([id number]) on 05/25/2022 7:16:32 AM  Radiology DG Chest Portable 1 View  Result Date: 05/25/2022 CLINICAL DATA:  Shortness of breath. EXAM: PORTABLE CHEST 1 VIEW COMPARISON:  January 11, 2021. FINDINGS: Stable cardiomegaly. Stable position of defibrillator is noted. Right lung is clear. Left lung base is not included in field-of-view and therefore pathology in this area cannot be excluded. Bony thorax is unremarkable. IMPRESSION: Left lung base is not included in field-of-view and therefore pathology in this area cannot be excluded. No definite acute abnormality is noted in the visualized lung fields. Electronically Signed   By: Marijo Conception M.D.   On: 05/25/2022 08:06    Procedures .Critical Care  Performed by: Regan Lemming, MD Authorized by: Regan Lemming, MD   Critical care provider statement:    Critical care time (minutes):  30   Critical care was necessary to treat or prevent imminent or life-threatening deterioration of the following conditions:  Cardiac failure   Critical care was time spent personally by me on the following activities:  Development of treatment plan with patient or surrogate, discussions with consultants, evaluation of patient's response to treatment, examination of patient, ordering and review of laboratory studies, ordering and review of radiographic studies, ordering and performing treatments and interventions, pulse oximetry, re-evaluation of patient's condition and review of old charts     Medications Ordered in ED Medications  heparin lock flush 100 unit/mL  (has no administration in time range)  amiodarone (NEXTERONE PREMIX) 360-4.14 MG/200ML-% (1.8 mg/mL) IV infusion (60 mg/hr  Intravenous New Bag/Given 05/25/22 1051)  amiodarone (NEXTERONE) IV bolus only 150 mg/100 mL (0 mg Intravenous Stopped 05/25/22 1048)    ED Course/ Medical Decision Making/ A&P                           Medical Decision Making Risk Prescription drug management. Decision regarding hospitalization.   61 year old male with medical history significant for CAD, HTN, cardiac arrest, CHF  (last EF in November 2022 25 to 30%), AICD in place, ESRD on HD, VT who presents to the emergency department with concern for multiple episodes of nonsustained VT and his ICD firing.  The patient states that he felt off yesterday with some improvement on sitting up.  He felt as if his ICD was going to fire this morning and subsequently felt a shock him 1 time while he was at dialysis.  EMS was called and reported initial bradycardia and then subsequent runs of V. tach.  He was administered 300 of amnio and 50 mEq of sodium bicarb through access via his dialysis fistula.  The patient states that he has had no chest pain or shortness of breath but has been feeling a fluttering sensation of palpitations in his chest and a sensation of malaise for the last 24 hours.  Patient is hemodynamically stable with EMS with blood pressure 140/100.  He arrived to the emergency department with intermittent runs of nonsustained V. tach, status post amiodarone bolus.  Ultrasound-guided IV access was obtained and the patient's [employer] pacemaker rep was contacted. Cardiology consulted on arrival. The patient remained hemodynamically stable with intermittent runs on nonsustained vtach on telemetry.  The patient did not receive a full dialysis session this morning. He denies chest pain. He appears euvolemic on exam but does endorse worsening shortness of breath. He has no evidence of pulmonary edema on CXR. EMS had  emergently accessed his fistula to administer Amiodarone. Neprhology consulted for further recommendations while inpatient. Dr. Royce Macadamia is aware of the patient for consultation.  Cardiology also consulted for recommendations. Will see the patient in consultation. While in the ED, the patient continued to have runs of nonsustained Vtach on telemetry. He remained hemodynamically stable.    Hospitalist medicine initially consulted as the patient was hemodynamically stable over 4 hours with intermittent runs of nonsustained VT.  He is on an amiodarone gtt.  Following consultation, cardiology did recommend escalation of care to the ICU.  Pulmonary critical care consulted for admission.   Final Clinical Impression(s) / ED Diagnoses Final diagnoses:  ICD (implantable cardioverter-defibrillator) discharge  Ventricular tachycardia ([place])  ESRD (end stage renal disease) ([place])    Rx / DC Orders ED Discharge Orders     None         Regan Lemming, MD 05/25/22 1110

## 2022-05-25 NOTE — Progress Notes (Addendum)
    Called to see pt due to rhythm change and increasing diaphoresis, weakness, SOB.  Pt was in VT, confirmed by device interrogation and got shocked today at HD.   In the ER, complexes were wide and bizarre, but are mostly irregular, ECG is read as atrial fib, HR generally 90- 120.  Of note K+ was 4.0 this am, no recheck due to difficult stick. Phos was 7.9 He has had insulin and D50,   He was seen by Dr Lovena Le earlier and started on amio. Dr Lovena Le contacted and stated his ICD would not shock unless HR was higher.   He has had his 4th amio bolus and is on a drip.   Rosaria Ferries, PA-C 05/25/2022 6:37 PM   Called to bedside.   Limited therapies available for polymorphic VT.  Not able to take lidocaine secondary to allergies.  Treated with beta blocker IV with plan for dialysis as decompensated HF is likely exacerbating his arrhythmia.

## 2022-05-25 NOTE — Progress Notes (Signed)
IVT consulted to deaccess fistula; Pt refused.  MD at bedside and agreed to leave in for dialysis today.

## 2022-05-25 NOTE — Progress Notes (Signed)
Lab called a critical lactic acid of 4.1 at 20:05, reported to Elink at 20:10. Pt. currently receiving hemodialysis with approximately 3.5 hours remaining.

## 2022-05-25 NOTE — Consult Note (Addendum)
[facility] Renal Consultation Note    Indication for Consultation:  Management of ESRD/hemodialysis; anemia, hypertension/volume and secondary hyperparathyroidism PCP: Dr. Karle Plumber HF-Dr. Aundra Dubin Nephrology-Dr. Hollie Salk  HPI: Victor Thompson is a 61 y.o. male with ESRD on hemodialysis T,Th,S at [facility]. PMH: CAD, HFrEF, cardiac arrest with AICD present, HTN, obesity, OSA, SHPT, anemia of ESRD. He is compliant with dialysis. Last full HD 05/22/2022 at which he left 0.5 kg above OP EDW. He has 28 minutes actual treatment time today.    Patient presented to HD this AM. During 1st hour of HD, ACID fired. EMS was called, he was noted to be having runs of V Tach. He was alert, oriented. He was transported to ED for further evaluation. Upon arrival in ED, he continued to have runs of VTach. He was started on amiodarone gtt and when seen, was sitting on side of stretcher texting. He denies chest pain, said he felt like he "was full of fluid" but CXR and exam unremarkable. He was seen by EP and has been admitted for VT storm, ischemic cardiomyopathy combined systolic HF.   Addendum: Patient seen in 2H08 this PM. Still having runs of Vtach but patient's respiratory status has started to decline. Repeat CXR is showing Cardiomegaly with central pulmonary vascular prominence and hazy opacities which may represent atelectasis or edema. Discussed with pulmonary. Will have urgent HD for volume removal this PM.    Past Medical History:  Diagnosis Date   AICD (automatic cardioverter/defibrillator) present    2015   Asthma    no meds   Cardiac arrest ([place]) 2015   CHF (congestive heart failure) (HCC)    Chronic kidney disease    ckd -stage 5   Coronary artery disease    Hypertension    Myocardial infarction ([place])    Obesity    Pneumonia    Pulmonary edema    Shortness of breath dyspnea    Sleep apnea    USES CPAP   Wears glasses    Past Surgical History:   Procedure Laterality Date   AV FISTULA PLACEMENT Right 03/15/2019   Procedure: RIGHT ARM ARTERIOVENOUS (AV) FISTULA CREATION;  Surgeon: Angelia Mould, MD;  Location: [place];  Service: Vascular;  Laterality: Right;   COLONOSCOPY     COLONOSCOPY W/ BIOPSIES AND POLYPECTOMY     CORONARY STENT PLACEMENT  2007   IMPLANTABLE CARDIOVERTER DEFIBRILLATOR IMPLANT  2015   RIGHT/LEFT HEART CATH AND CORONARY ANGIOGRAPHY N/A 07/08/2021   Procedure: RIGHT/LEFT HEART CATH AND CORONARY ANGIOGRAPHY;  Surgeon: Larey Dresser, MD;  Location: [place] [facility];  Service: Cardiovascular;  Laterality: N/A;   SUBQ ICD CHANGEOUT N/A 10/13/2020   Procedure: SUBQ ICD CHANGEOUT;  Surgeon: Evans Lance, MD;  Location: [place] [facility];  Service: Cardiovascular;  Laterality: N/A;   Family History  Problem Relation Age of Onset   Hypertension Mother    Liver disease Father    Social History:  reports that he has never smoked. He has never used smokeless tobacco. He reports that he does not drink alcohol and does not use drugs. Allergies  Allergen Reactions   Ace Inhibitors Swelling    Swelling of the tongue   Influenza Vaccines Hives and Swelling    SWELLING REACTION UNSPECIFIED    Ketorolac Swelling    SWELLING REACTION UNSPECIFIED    Lidocaine Swelling    TONGUE SWELLS   Lisinopril Swelling    TONGUE SWELLING Pt reported problem  with a BP med which sounded like  lisinopril  But as of 09/19/06,pt had tolerated altace without problem   Penicillins Swelling    TONGUE SWELLS Has patient had a PCN reaction causing immediate rash, facial/tongue/throat swelling, SOB or lightheadedness with hypotension: Yes Has patient had a PCN reaction causing severe rash involving mucus membranes or skin necrosis: No Has patient had a PCN reaction that required hospitalization No Has patient had a PCN reaction occurring within the last 10 years: Yes If all of the above answers are "NO", then may proceed with  Cephalosporin use.    Prior to Admission medications   Medication Sig Start Date End Date Taking? Authorizing Provider  albuterol (PROVENTIL) (2.5 MG/3ML) 0.083% nebulizer solution TAKE 3 MLS (2.5 MG TOTAL) BY NEBULIZATION EVERY 6 (SIX) HOURS AS NEEDED FOR WHEEZING OR SHORTNESS OF BREATH. 12/30/21 12/30/22 Yes [name], MD  albuterol (VENTOLIN HFA) 108 (90 Base) MCG/ACT inhaler Inhale 2 puffs into the lungs every 4 (four) hours as needed for wheezing or shortness of breath. 10/01/19  Yes [name], MD  aspirin EC 81 MG tablet Take 1 tablet (81 mg total) by mouth daily. 01/22/16  Yes [name], Josalyn, MD  atorvastatin (LIPITOR) 40 MG tablet TAKE 1 TABLET (40 MG TOTAL) BY MOUTH AT BEDTIME. 07/09/21 07/09/22 Yes [name], MD  AURYXIA 1 GM 210 MG(Fe) tablet Take 1 tablet (210 mg total) by mouth 3 (three) times daily. 08/12/21  Yes [name], MD  calcium acetate (PHOSLO) 667 MG capsule Take 2 capsule by mouth three times a day with meals 06/09/21  Yes   carvedilol (COREG) 25 MG tablet TAKE 1 TABLET (25 MG TOTAL) BY MOUTH 2 (TWO) TIMES DAILY WITH A MEAL. 12/01/21 12/01/22 Yes [name], MD  cetirizine (ZYRTEC) 10 MG tablet Take 1 tablet (10 mg total) by mouth 2 (two) times daily. 11/12/21  Yes Valentina Shaggy, MD  Cholecalciferol (VITAMIN D-3) 125 MCG (5000 UT) TABS Take 5,000 Units by mouth daily.   Yes [provider]  docusate sodium (COLACE) 100 MG capsule Take 1 capsule (100 mg total) by mouth 2 (two) times daily. 02/15/22  Yes [name], MD  famotidine (PEPCID) 20 MG tablet Take 1 tablet (20 mg total) by mouth 2 (two) times daily. 11/12/21  Yes Valentina Shaggy, MD  fluticasone [facility]) 50 MCG/ACT nasal spray Place into both nostrils as needed for allergies or rhinitis.   Yes [provider]  hydrALAZINE (APRESOLINE) 25 MG tablet Take 3 tablets (75 mg total) by mouth 3 (three) times daily. 12/15/21  Yes Larey Dresser, MD   isosorbide mononitrate (IMDUR) 60 MG 24 hr tablet Take 1 tablet (60 mg total) by mouth daily. 12/15/21 12/15/22 Yes Larey Dresser, MD  polyethylene glycol (MIRALAX) 17 g packet Take 17 g by mouth daily. Titrate up as needed 05/05/22  Yes Armbruster, Carlota Raspberry, MD  sildenafil (VIAGRA) 50 MG tablet Take 1 tablet by mouth once a day as needed 30 min prior to sexual activity. 06/16/21  Yes   SYMBICORT 80-4.5 MCG/ACT inhaler INHALE TWO PUFFS INTO THE LUNGS TWICE DAILY (BULK) 03/10/22  Yes [name], MD  EPINEPHrine 0.3 mg/0.3 mL IJ SOAJ injection Inject 0.3 mg into the muscle once as needed for up to 2 doses (if worsening tongue swelling, SOB, hypoxia, or other concerns for progressive anaphylaxis). Patient not taking: Reported on 05/25/2022 07/15/21   [name], MD  ethyl chloride spray SPRAY  A SMALL AMOUNT THREE TIMES A WEEK JUST PRIOR TO NEEDLE INSERTION 10/26/21   [name], MD  Methoxy PEG-Epoetin Beta (MIRCERA IJ)  08/04/21 08/03/22  [provider]  Rectal Protectant-Emollient (CALMOL-4) 76-10 % SUPP Use as directed once to twice daily 05/05/22   Armbruster, Carlota Raspberry, MD   Current Facility-Administered Medications  Medication Dose Route Frequency Provider Last Rate Last Admin   0.9 %  sodium chloride infusion   Intravenous Continuous Minor, Grace Bushy, NP       acetaminophen (TYLENOL) tablet 650 mg  650 mg Oral Q6H PRN Margaretha Seeds, MD       albuterol (PROVENTIL) (2.5 MG/3ML) 0.083% nebulizer solution 2.5 mg  2.5 mg Nebulization Q4H Minor, Grace Bushy, NP   2.5 mg at 05/25/22 1232   albuterol (PROVENTIL) (2.5 MG/3ML) 0.083% nebulizer solution 2.5 mg  2.5 mg Nebulization Q2H PRN Minor, Grace Bushy, NP       amiodarone (NEXTERONE PREMIX) 360-4.14 MG/200ML-% (1.8 mg/mL) IV infusion  60 mg/hr Intravenous Continuous Baldwin Jamaica, PA-C 33.3 mL/hr at 05/25/22 1051 60 mg/hr at 05/25/22 1051   docusate sodium (COLACE) capsule 100 mg  100 mg Oral BID PRN Minor, Grace Bushy,  NP       heparin injection 5,000 Units  5,000 Units Subcutaneous Q8H Minor, Grace Bushy, NP       heparin lock flush 100 unit/mL  500 Units Intracatheter Prior to discharge Regan Lemming, MD       pantoprazole (PROTONIX) injection 40 mg  40 mg Intravenous QHS Minor, Grace Bushy, NP       polyethylene glycol (MIRALAX / GLYCOLAX) packet 17 g  17 g Oral Daily PRN Minor, Grace Bushy, NP       Labs: Basic Metabolic Panel: Recent Labs  Lab 05/25/22 0705 05/25/22 0751 05/25/22 1349  NA 141 139  --   K 4.2 4.0  --   CL 94* 97*  --   CO2 30  --   --   GLUCOSE 128* 113*  --   BUN 46* 43*  --   CREATININE 12.46* 13.80*  --   CALCIUM 8.2*  --   --   PHOS  --   --  7.9*   Liver Function Tests: Recent Labs  Lab 05/25/22 0705  AST 17  ALT 18  ALKPHOS 59  BILITOT 0.7  PROT 7.3  ALBUMIN 3.3*   Recent Labs  Lab 05/25/22 1350  LIPASE 35  AMYLASE 91   No results for input(s): "AMMONIA" in the last 168 hours. CBC: Recent Labs  Lab 05/25/22 0705 05/25/22 0751  WBC 6.0  --   NEUTROABS 4.4  --   HGB 11.7* 11.9*  HCT 36.0* 35.0*  MCV 101.1*  --   PLT 199  --    Cardiac Enzymes: No results for input(s): "CKTOTAL", "CKMB", "CKMBINDEX", "TROPONINI" in the last 168 hours. CBG: No results for input(s): "GLUCAP" in the last 168 hours. Iron Studies: No results for input(s): "IRON", "TIBC", "TRANSFERRIN", "FERRITIN" in the last 72 hours. Studies/Results: DG Chest Portable 1 View  Result Date: 05/25/2022 CLINICAL DATA:  Shortness of breath. EXAM: PORTABLE CHEST 1 VIEW COMPARISON:  January 11, 2021. FINDINGS: Stable cardiomegaly. Stable position of defibrillator is noted. Right lung is clear. Left lung base is not included in field-of-view and therefore pathology in this area cannot be excluded. Bony thorax is unremarkable. IMPRESSION: Left lung base is not included in field-of-view and therefore pathology in this area cannot be excluded.  No definite acute abnormality is noted in the visualized  lung fields. Electronically Signed   By: Marijo Conception M.D.   On: 05/25/2022 08:06    ROS: As per HPI otherwise negative.   Physical Exam: Vitals:   05/25/22 1330 05/25/22 1340 05/25/22 1400 05/25/22 1415  BP:  128/74  124/73  Pulse:  (!) 44 (!) 42 (!) 42  Resp:  13 16 (!) 21  Temp:  98.6 F (37 C)    TempSrc:  Oral    SpO2: 95% 95% 97% 94%  Weight:  121.6 kg    Height:  '5\' 7"'$  (1.702 m)       General: Pleasant older male in no acute distress. Head: Normocephalic, atraumatic, sclera non-icteric, mucus membranes are moist Neck: Supple. JVD not elevated. Lungs: Clear bilaterally to auscultation without wheezes, rales, or rhonchi. Breathing is unlabored. Heart: S1,S2 irreg. Salvos of ventricular tachycardia on monitor. Occasional brief SR.  Abdomen: Obese NABS, NT, ND Lower extremities: Trace BLE edema Neuro: Alert and oriented X 3. Moves all extremities spontaneously. Psych:  Responds to questions appropriately with a normal affect. Dialysis Access: RUA AVF + T/B  Dialysis Orders: [place] T,Th,S 4 hr 15 min 180NRe 450/Autoflow 1.5 120kg  UFP4 AVF -Heparin 3000 units IV TIW -Venofer 50 mg IV weekly -Hectorol 6 mcg IV TIW   Assessment/Plan:  AICD firing/V Tach Storm-seen by EP in ER. On amiodarone. Device rep to come interrogate ACID. Per primary/EP.  HFrEF-EF 25-30% 01/2021. NYHA II. Followed by HF. Does not appear volume overloaded by exam. Per primary  ESRD - T,Th,S. Deferred HD initially today but volume status has deteriorated . Will have urgent HD for volume removal. K+ 4.0 This was drawn immediately post HD. Will recheck now.   Hypertension/volume  - BP controlled. Not overtly volume overloaded by exam earlier today but repeat CXR confirms pulmonary edema. UF as tolerated and lower volume.  Anemia  - HGB 11.7. No ESA needed. Continue weekly Venofer  Metabolic bone disease -  Corrected Ca+ 8.7 PO4 7.9. Continue binders, VDRA.   Nutrition -NPO  OSA-per primary.   Victor H.  Owens Shark, NP-C 05/25/2022, 3:29 PM  [employer] [phone]     Seen and examined independently.  Agree with note and exam as documented above by physician extender and as noted here.   Patient with a history of end-stage renal disease on hemodialysis TTS at [place], CAD, heart failure with reduced EF, AICD in situ and hypertension who presented to the hospital after his AICD fired in dialysis today.  He had run about an hour of HD then his AICD fired and EMS was called.  He was found to have vtach and was brought to the ER.  He had an amnio bolus and amio infusion initiated.  EP consulted.  Initially emergent IV access was obtained by EMS in the patient's fistula per ER attending report and this was later removed once the ER got IV access.    Seen this am  in the ER and again this afternoon in the ICU.  In the interim his respiratory status deteriorated 2/2 new pulmonary edema.    General adult male in bed short of breath HEENT normocephalic atraumatic extraocular movements intact sclera anicteric Neck supple trachea midline Lungs increased work of breathing and breath sounds reduced; getting a nebulizer currently  Heart tachycardic/VT on monitor intermittently in the room  Abdomen soft nontender nondistended Extremities 2+ edema  Psych normal mood and affect Neuro -  alert and oriented x3 provides hx and follows commands Access RUE AVF bruit and thrill   # VT - s/p EP consult - amio bolus earlier and on amio gtt - team giving amio bolus  - headed into dialysis he has been bolused with amio and is on an amio gtt - is albuterol aggravating?  - phenylephrine is available if needed   # ESRD  - HD this afternoon urgently - orders in and the HD unit is aware. AVF is used for dialysis   - outpatient schedule is TTS  # Acute hypoxic respiratory failure  - secondary to pulmonary edema - optimize volume with dialysis - he has a fistula and wouldn't tolerate lying flat  for placement of a catheter well - appropriate for trial of HD   # Heart failure reduced ejection fraction  - optimize volume with HD   # HTN  - optimize volume with HD  # Anemia CKD  - Hb 11.9 - no ESA  # Metabolic bone disease  - continue binders and activated vit D when taking PO  Claudia Desanctis, MD 05/25/2022 6:16 PM

## 2022-05-25 NOTE — H&P (Signed)
NAME:  RAFAN SANDERS, MRN:  [id number], DOB:  08-30-1961, LOS: 0 ADMISSION DATE:  05/25/2022, CONSULTATION DATE: 05/25/2022 REFERRING MD: Emergency department, CHIEF COMPLAINT: AICD firing  History of Present Illness:  Mr. Woodcox is a 61 year old male former smoker who was in dialysis at [facility] and after approximately 1 hour he noticed his AICD fired off.  He is transferred to [facility] emergency department where he was stable pulmonary critical care called to the bedside for admission for hypotension although he is hemodynamically stable at the time of admission.  He is got extensive past medical history is well-documented below.  He states he can sense when his AICD is going to fire off as he knows his [employer] does decline just before it firings off.  He will be admitted to the intensive care unit for further evaluation and treatment.  Pertinent  Medical History   Past Medical History:  Diagnosis Date   AICD (automatic cardioverter/defibrillator) present    2015   Asthma    no meds   Cardiac arrest ([place]) 2015   CHF (congestive heart failure) (HCC)    Chronic kidney disease    ckd -stage 5   Coronary artery disease    Hypertension    Myocardial infarction (HCC)    Obesity    Pneumonia    Pulmonary edema    Shortness of breath dyspnea    Sleep apnea    USES CPAP   Wears glasses      Significant Hospital Events: Including procedures, antibiotic start and stop dates in addition to other pertinent events   05/25/2022 AICD firing 05/25/2022 admit to the intensive care unit under critical care service  Interim History / Subjective:  Was in dialysis when AICD fired off.  Objective   Blood pressure 110/75, pulse 91, temperature 97.8 F (36.6 C), temperature source Oral, resp. rate 20, height '5\' 7"'$  (1.702 m), weight 121.6 kg, SpO2 99 %.        Intake/Output Summary (Last 24 hours) at 05/25/2022 1201 Last data filed at 05/25/2022 1051 Gross per 24 hour  Intake 200 ml   Output --  Net 200 ml   Filed Weights   05/25/22 0658  Weight: 121.6 kg    Examination: General: Morbid obese male no acute distress HENT: No JVD or lymphadenopathy is appreciated Lungs: Decreased breath sounds in the bases mild rhonchi Cardiovascular: Heart sounds are irregular Abdomen: Soft mildly distended positive bowel sounds Extremities: 1+ edema right bicep dialysis bruit and thrill Neuro: Grossly intact without focal defect GU: Does not void  Resolved Hospital Problem list     Assessment & Plan:  Discharge of AICD during hemodialysis on 05/25/2022.  With known ischemia cardiomyopathy and combined systolic diastolic heart failure with AICD implantation Cardiology consult Amiodarone has been started Admit to the intensive care unit Careful monitoring Follow electrolytes Check troponins Not on full anticoagulation at this time.  End-stage renal disease Dialysis per nephrology  Obstructive sleep apnea CPAP   History of asthma Bronchodilators  Severe obesity Weight loss    Best Practice (right click and "Reselect all SmartList Selections" daily)   Diet/type: NPO DVT prophylaxis: prophylactic heparin  GI prophylaxis: PPI Lines: N/A Foley:  N/A Code Status:  full code Last date of multidisciplinary goals of care discussion [TBD]  Labs   CBC: Recent Labs  Lab 05/25/22 0705 05/25/22 0751  WBC 6.0  --   NEUTROABS 4.4  --   HGB 11.7* 11.9*  HCT 36.0* 35.0*  MCV 101.1*  --   PLT 199  --     Basic Metabolic Panel: Recent Labs  Lab 05/25/22 0705 05/25/22 0751  NA 141 139  K 4.2 4.0  CL 94* 97*  CO2 30  --   GLUCOSE 128* 113*  BUN 46* 43*  CREATININE 12.46* 13.80*  CALCIUM 8.2*  --   MG 2.6*  --    GFR: Estimated Creatinine Clearance: 7.3 mL/min (A) (by C-G formula based on SCr of 13.8 mg/dL (H)). Recent Labs  Lab 05/25/22 0705  WBC 6.0    Liver Function Tests: Recent Labs  Lab 05/25/22 0705  AST 17  ALT 18  ALKPHOS 59   BILITOT 0.7  PROT 7.3  ALBUMIN 3.3*   No results for input(s): "LIPASE", "AMYLASE" in the last 168 hours. No results for input(s): "AMMONIA" in the last 168 hours.  ABG    Component Value Date/Time   PHART 7.404 07/08/2021 1111   PHART 7.398 07/08/2021 1111   PCO2ART 55.8 (H) 07/08/2021 1111   PCO2ART 56.9 (H) 07/08/2021 1111   PO2ART 41 (L) 07/08/2021 1111   PO2ART 41 (L) 07/08/2021 1111   HCO3 34.9 (H) 07/08/2021 1111   HCO3 35.1 (H) 07/08/2021 1111   TCO2 28 05/25/2022 0751   O2SAT 75.0 07/08/2021 1111   O2SAT 74.0 07/08/2021 1111     Coagulation Profile: No results for input(s): "INR", "PROTIME" in the last 168 hours.  Cardiac Enzymes: No results for input(s): "CKTOTAL", "CKMB", "CKMBINDEX", "TROPONINI" in the last 168 hours.  HbA1C: Hemoglobin A1C  Date/Time Value Ref Range Status  12/22/2015 03:22 PM 5.90  Final    CBG: No results for input(s): "GLUCAP" in the last 168 hours.  Review of Systems:   10 point review of system taken, please see HPI for positives and negatives. Is also positive for general malaise for the last 2 to 3 days.  Shortness of breath feeling like he is volume overloaded despite aggressive diuresis, he notes that he can feel his firing of his AICD coming on less likely fact his heart pumping adequately he feels that sensation of coming down.  Past Medical History:  He,  has a past medical history of AICD (automatic cardioverter/defibrillator) present, Asthma, Cardiac arrest ([place]) (2015), CHF (congestive heart failure) ([place]), Chronic kidney disease, Coronary artery disease, Hypertension, Myocardial infarction ([place]), Obesity, Pneumonia, Pulmonary edema, Shortness of breath dyspnea, Sleep apnea, and Wears glasses.   Surgical History:   Past Surgical History:  Procedure Laterality Date   AV FISTULA PLACEMENT Right 03/15/2019   Procedure: RIGHT ARM ARTERIOVENOUS (AV) FISTULA CREATION;  Surgeon: Angelia Mould, MD;  Location: [facility];   Service: Vascular;  Laterality: Right;   COLONOSCOPY     COLONOSCOPY W/ BIOPSIES AND POLYPECTOMY     CORONARY STENT PLACEMENT  2007   IMPLANTABLE CARDIOVERTER DEFIBRILLATOR IMPLANT  2015   RIGHT/LEFT HEART CATH AND CORONARY ANGIOGRAPHY N/A 07/08/2021   Procedure: RIGHT/LEFT HEART CATH AND CORONARY ANGIOGRAPHY;  Surgeon: Larey Dresser, MD;  Location: [facility];  Service: Cardiovascular;  Laterality: N/A;   SUBQ ICD CHANGEOUT N/A 10/13/2020   Procedure: SUBQ ICD CHANGEOUT;  Surgeon: Evans Lance, MD;  Location: [facility];  Service: Cardiovascular;  Laterality: N/A;     Social History:   reports that he has never smoked. He has never used smokeless tobacco. He reports that he does not drink alcohol and does not use drugs.   Family History:  His family history includes  Hypertension in his mother; Liver disease in his father.   Allergies Allergies  Allergen Reactions   Ace Inhibitors Swelling    Swelling of the tongue   Influenza Vaccines Hives and Swelling    SWELLING REACTION UNSPECIFIED    Ketorolac Swelling    SWELLING REACTION UNSPECIFIED    Lidocaine Swelling    TONGUE SWELLS   Lisinopril Swelling    TONGUE SWELLING Pt reported problem with a BP med which sounded like  lisinopril  But as of 09/19/06,pt had tolerated altace without problem   Penicillins Swelling    TONGUE SWELLS Has patient had a PCN reaction causing immediate rash, facial/tongue/throat swelling, SOB or lightheadedness with hypotension: Yes Has patient had a PCN reaction causing severe rash involving mucus membranes or skin necrosis: No Has patient had a PCN reaction that required hospitalization No Has patient had a PCN reaction occurring within the last 10 years: Yes If all of the above answers are "NO", then may proceed with Cephalosporin use.      Home Medications  Prior to Admission medications   Medication Sig Start Date End Date Taking? Authorizing Provider  albuterol  (PROVENTIL) (2.5 MG/3ML) 0.083% nebulizer solution TAKE 3 MLS (2.5 MG TOTAL) BY NEBULIZATION EVERY 6 (SIX) HOURS AS NEEDED FOR WHEEZING OR SHORTNESS OF BREATH. 12/30/21 12/30/22 Yes [name], MD  albuterol (VENTOLIN HFA) 108 (90 Base) MCG/ACT inhaler Inhale 2 puffs into the lungs every 4 (four) hours as needed for wheezing or shortness of breath. 10/01/19  Yes [name], MD  aspirin EC 81 MG tablet Take 1 tablet (81 mg total) by mouth daily. 01/22/16  Yes [name], Josalyn, MD  atorvastatin (LIPITOR) 40 MG tablet TAKE 1 TABLET (40 MG TOTAL) BY MOUTH AT BEDTIME. 07/09/21 07/09/22 Yes [name], MD  AURYXIA 1 GM 210 MG(Fe) tablet Take 1 tablet (210 mg total) by mouth 3 (three) times daily. 08/12/21  Yes [name], MD  calcium acetate (PHOSLO) 667 MG capsule Take 2 capsule by mouth three times a day with meals 06/09/21  Yes   carvedilol (COREG) 25 MG tablet TAKE 1 TABLET (25 MG TOTAL) BY MOUTH 2 (TWO) TIMES DAILY WITH A MEAL. 12/01/21 12/01/22 Yes [name], MD  cetirizine (ZYRTEC) 10 MG tablet Take 1 tablet (10 mg total) by mouth 2 (two) times daily. 11/12/21  Yes Valentina Shaggy, MD  Cholecalciferol (VITAMIN D-3) 125 MCG (5000 UT) TABS Take 5,000 Units by mouth daily.   Yes [provider]  docusate sodium (COLACE) 100 MG capsule Take 1 capsule (100 mg total) by mouth 2 (two) times daily. 02/15/22  Yes [name], MD  famotidine (PEPCID) 20 MG tablet Take 1 tablet (20 mg total) by mouth 2 (two) times daily. 11/12/21  Yes Valentina Shaggy, MD  fluticasone [facility]) 50 MCG/ACT nasal spray Place into both nostrils as needed for allergies or rhinitis.   Yes [provider]  hydrALAZINE (APRESOLINE) 25 MG tablet Take 3 tablets (75 mg total) by mouth 3 (three) times daily. 12/15/21  Yes Larey Dresser, MD  isosorbide mononitrate (IMDUR) 60 MG 24 hr tablet Take 1 tablet (60 mg total) by mouth daily. 12/15/21 12/15/22 Yes Larey Dresser, MD   polyethylene glycol (MIRALAX) 17 g packet Take 17 g by mouth daily. Titrate up as needed 05/05/22  Yes Armbruster, Carlota Raspberry, MD  sildenafil (VIAGRA) 50 MG tablet Take 1 tablet by mouth once a day as needed 30 min prior to  sexual activity. 06/16/21  Yes   SYMBICORT 80-4.5 MCG/ACT inhaler INHALE TWO PUFFS INTO THE LUNGS TWICE DAILY (BULK) 03/10/22  Yes [name], MD  EPINEPHrine 0.3 mg/0.3 mL IJ SOAJ injection Inject 0.3 mg into the muscle once as needed for up to 2 doses (if worsening tongue swelling, SOB, hypoxia, or other concerns for progressive anaphylaxis). Patient not taking: Reported on 05/25/2022 07/15/21   [name], MD  ethyl chloride spray SPRAY A SMALL AMOUNT THREE TIMES A WEEK JUST PRIOR TO NEEDLE INSERTION 10/26/21   [name], MD  Methoxy PEG-Epoetin Beta (MIRCERA IJ)  08/04/21 08/03/22  [provider]  Rectal Protectant-Emollient (CALMOL-4) 76-10 % SUPP Use as directed once to twice daily 05/05/22   Armbruster, Carlota Raspberry, MD     Critical care time: 34  min   Richardson Landry Exzavier Ruderman ACNP Acute Care Nurse Practitioner [place] Please consult Amion 05/25/2022, 12:02 PM

## 2022-05-25 NOTE — ED Notes (Signed)
I notified Dr Tamala Julian that pt's bp is trending down. He notified critical care and I'm awaiting orders

## 2022-05-25 NOTE — Consult Note (Addendum)
Cardiology Consultation:   Patient ID: KANTON KAMEL MRN: [id number]; DOB: [birth year]  Admit date: 05/25/2022 Date of Consult: 05/25/2022  PCP:  [name], MD   [facility] Providers Cardiologist: Dr. Radford Pax AHF: Dr. Aundra Dubin EP: Dr. Rayann Heman  Patient Profile:   Victor Thompson is a 61 y.o. male with a hx of VF arrest (2015), CAD (PCI in 2005, unknown details), ICM,. Chronic CHF (systolic), ESRF on HD , OSA w/CPAP, HTN, HLD, recurrent angio edema who is being seen 05/25/2022 for the evaluation of VT at the request of Dr. Armandina Gemma .  Device information BSCi S-ICD implanted  elsewhere 04/11/2014 > gen change 10/13/2020  History of Present Illness:   Victor Thompson was in his USOH until a couple days ago started to feel palpitations and unusual SOB.  He suspected perhaps volume OL though gets HD regularly (confirmed with HD team, is very compliant, no missed sessions), was at dialysis this AM when he got shocked by his ICD.  He has not had any LOC, no CP of any kind. He reports good medication and dialysis compliance.  EMS was called, and transported to ER, he has been observed to have very frequent NSPMVT episodes that he is aware of, got amiodarone in route '300mg'$  total and started on gtt here, he also got NaHCO3 labs really are largely unremarkable outside his creat.  EP was asked to see  LABS K+ 4.2, 4.0 BUN/Creat 46/12.46 > 13.80 Mag 2.6 BNP 1355 HS Trop 72 > 77 WBC 6.0 H/H 11/35 Plts 199   He does have hx of CADwith prior PCI though cath Aug 2022 noted no obstructive disease Cardiolite 2017: Inferior and inferolateral large fixed defect suggestive of prior infarction, EF 28% Last Echo was Nov 2022, known reduced LVEF 25-30% (unchanged from 2021.   Past Medical History:  Diagnosis Date   AICD (automatic cardioverter/defibrillator) present    2015   Asthma    no meds   Cardiac arrest ([place]) 2015   CHF (congestive heart failure) (HCC)    Chronic kidney disease     ckd -stage 5   Coronary artery disease    Hypertension    Myocardial infarction ([place])    Obesity    Pneumonia    Pulmonary edema    Shortness of breath dyspnea    Sleep apnea    USES CPAP   Wears glasses     Past Surgical History:  Procedure Laterality Date   AV FISTULA PLACEMENT Right 03/15/2019   Procedure: RIGHT ARM ARTERIOVENOUS (AV) FISTULA CREATION;  Surgeon: Angelia Mould, MD;  Location: [facility];  Service: Vascular;  Laterality: Right;   COLONOSCOPY     COLONOSCOPY W/ BIOPSIES AND POLYPECTOMY     CORONARY STENT PLACEMENT  2007   IMPLANTABLE CARDIOVERTER DEFIBRILLATOR IMPLANT  2015   RIGHT/LEFT HEART CATH AND CORONARY ANGIOGRAPHY N/A 07/08/2021   Procedure: RIGHT/LEFT HEART CATH AND CORONARY ANGIOGRAPHY;  Surgeon: Larey Dresser, MD;  Location: [place] [facility];  Service: Cardiovascular;  Laterality: N/A;   SUBQ ICD CHANGEOUT N/A 10/13/2020   Procedure: SUBQ ICD CHANGEOUT;  Surgeon: Evans Lance, MD;  Location: [place] [facility];  Service: Cardiovascular;  Laterality: N/A;     Home Medications:  Prior to Admission medications   Medication Sig Start Date End Date Taking? Authorizing Provider  albuterol (PROVENTIL) (2.5 MG/3ML) 0.083% nebulizer solution TAKE 3 MLS (2.5 MG TOTAL) BY NEBULIZATION EVERY 6 (SIX) HOURS AS NEEDED FOR WHEEZING OR SHORTNESS OF BREATH.  12/30/21 12/30/22 Yes [name], MD  albuterol (VENTOLIN HFA) 108 (90 Base) MCG/ACT inhaler Inhale 2 puffs into the lungs every 4 (four) hours as needed for wheezing or shortness of breath. 10/01/19  Yes [name], MD  aspirin EC 81 MG tablet Take 1 tablet (81 mg total) by mouth daily. 01/22/16  Yes [name], Josalyn, MD  atorvastatin (LIPITOR) 40 MG tablet TAKE 1 TABLET (40 MG TOTAL) BY MOUTH AT BEDTIME. 07/09/21 07/09/22 Yes [name], MD  AURYXIA 1 GM 210 MG(Fe) tablet Take 1 tablet (210 mg total) by mouth 3 (three) times daily. 08/12/21  Yes [name], MD  calcium acetate (PHOSLO)  667 MG capsule Take 2 capsule by mouth three times a day with meals 06/09/21  Yes   carvedilol (COREG) 25 MG tablet TAKE 1 TABLET (25 MG TOTAL) BY MOUTH 2 (TWO) TIMES DAILY WITH A MEAL. 12/01/21 12/01/22 Yes [name], MD  cetirizine (ZYRTEC) 10 MG tablet Take 1 tablet (10 mg total) by mouth 2 (two) times daily. 11/12/21  Yes Valentina Shaggy, MD  Cholecalciferol (VITAMIN D-3) 125 MCG (5000 UT) TABS Take 5,000 Units by mouth daily.   Yes [provider]  docusate sodium (COLACE) 100 MG capsule Take 1 capsule (100 mg total) by mouth 2 (two) times daily. 02/15/22  Yes [name], MD  famotidine (PEPCID) 20 MG tablet Take 1 tablet (20 mg total) by mouth 2 (two) times daily. 11/12/21  Yes Valentina Shaggy, MD  fluticasone [facility]) 50 MCG/ACT nasal spray Place into both nostrils as needed for allergies or rhinitis.   Yes [provider]  hydrALAZINE (APRESOLINE) 25 MG tablet Take 3 tablets (75 mg total) by mouth 3 (three) times daily. 12/15/21  Yes Larey Dresser, MD  isosorbide mononitrate (IMDUR) 60 MG 24 hr tablet Take 1 tablet (60 mg total) by mouth daily. 12/15/21 12/15/22 Yes Larey Dresser, MD  polyethylene glycol (MIRALAX) 17 g packet Take 17 g by mouth daily. Titrate up as needed 05/05/22  Yes Armbruster, Carlota Raspberry, MD  sildenafil (VIAGRA) 50 MG tablet Take 1 tablet by mouth once a day as needed 30 min prior to sexual activity. 06/16/21  Yes   SYMBICORT 80-4.5 MCG/ACT inhaler INHALE TWO PUFFS INTO THE LUNGS TWICE DAILY (BULK) 03/10/22  Yes [name], MD  EPINEPHrine 0.3 mg/0.3 mL IJ SOAJ injection Inject 0.3 mg into the muscle once as needed for up to 2 doses (if worsening tongue swelling, SOB, hypoxia, or other concerns for progressive anaphylaxis). Patient not taking: Reported on 05/25/2022 07/15/21   [name], MD  ethyl chloride spray SPRAY A SMALL AMOUNT THREE TIMES A WEEK JUST PRIOR TO NEEDLE INSERTION 10/26/21   [name], MD   Methoxy PEG-Epoetin Beta (MIRCERA IJ)  08/04/21 08/03/22  [provider]  Rectal Protectant-Emollient (CALMOL-4) 76-10 % SUPP Use as directed once to twice daily 05/05/22   Armbruster, Carlota Raspberry, MD    Inpatient Medications: Scheduled Meds:  Continuous Infusions:  amiodarone 60 mg/hr (05/25/22 0704)   amiodarone     PRN Meds: heparin lock flush  Allergies:    Allergies  Allergen Reactions   Ace Inhibitors Swelling    Swelling of the tongue   Influenza Vaccines Hives and Swelling    SWELLING REACTION UNSPECIFIED    Ketorolac Swelling    SWELLING REACTION UNSPECIFIED    Lidocaine Swelling    TONGUE SWELLS   Lisinopril Swelling    TONGUE  SWELLING Pt reported problem with a BP med which sounded like  lisinopril  But as of 09/19/06,pt had tolerated altace without problem   Penicillins Swelling    TONGUE SWELLS Has patient had a PCN reaction causing immediate rash, facial/tongue/throat swelling, SOB or lightheadedness with hypotension: Yes Has patient had a PCN reaction causing severe rash involving mucus membranes or skin necrosis: No Has patient had a PCN reaction that required hospitalization No Has patient had a PCN reaction occurring within the last 10 years: Yes If all of the above answers are "NO", then may proceed with Cephalosporin use.     Social History:   Social History   Socioeconomic History   Marital status: Single    Spouse name: Not on file   Number of children: 3   Years of education: Not on file   Highest education level: Not on file  Occupational History   Occupation: retired  Tobacco Use   Smoking status: Never   Smokeless tobacco: Never  Vaping Use   Vaping Use: Never used  Substance and Sexual Activity   Alcohol use: No   Drug use: No   Sexual activity: Yes  Other Topics Concern   Not on file  Social History Narrative   ** Merged History Encounter **       Social Determinants of Health   Financial Resource Strain: Not on file   Food Insecurity: Not on file  Transportation Needs: Not on file  Physical Activity: Not on file  Stress: Not on file  Social Connections: Not on file  Intimate Partner Violence: Not on file    Family History:   Family History  Problem Relation Age of Onset   Hypertension Mother    Liver disease Father      ROS:  Please see the history of present illness.  All other ROS reviewed and negative.     Physical Exam/Data:   Vitals:   05/25/22 0845 05/25/22 0900 05/25/22 0915 05/25/22 0930  BP: 115/69 117/65 102/71 (!) 112/43  Pulse: (!) 114 (!) 113 (!) 108 (!) 50  Resp: 15 17 (!) 24 (!) 22  Temp:      TempSrc:      SpO2: 100% 100% 100% 99%  Weight:      Height:       No intake or output data in the 24 hours ending 05/25/22 0954    05/25/2022    6:58 AM 05/18/2022    1:41 PM 05/05/2022    9:20 AM  Last 3 Weights  Weight (lbs) 268 lb 1.3 oz 268 lb 2 oz 267 lb  Weight (kg) 121.6 kg 121.621 kg 121.11 kg     Body mass index is 41.99 kg/m.  General:  Well nourished, well developed, appears anxious but in no acute physical distress HEENT: normal Neck: no JVD Vascular: No carotid bruits; Distal pulses 2+ bilaterally Cardiac:  RRR; frequent extrasystoles, no murmurs, gallops or rubs Lungs:  CTA b/l, no wheezing, rhonchi or rales  Abd: soft, nontender  Ext: no edema AVF in RUE Musculoskeletal:  No deformities Skin: warm and dry  Neuro:  no focal abnormalities noted Psych:  Normal affect   EKG:  The EKG was personally reviewed and demonstrates:   NSPMVT, PVCs, SR  underlying  Telemetry:  Telemetry was personally reviewed and demonstrates:   SR w/frequent runs of NSPMVT  Relevant CV Studies:   10/21/21: TTE  1. Left ventricular ejection fraction, by estimation, is 25 to 30%. The  left  ventricle has severely decreased function. The left ventricle  demonstrates global hypokinesis. The left ventricular internal cavity size  was moderately dilated. Left  ventricular  diastolic parameters are consistent with Grade I diastolic  dysfunction (impaired relaxation).   2. Right ventricular systolic function is normal. The right ventricular  size is normal. There is normal pulmonary artery systolic pressure. The  estimated right ventricular systolic pressure is 17.4 mmHg.   3. The mitral valve is normal in structure. Mild mitral valve  regurgitation. No evidence of mitral stenosis.   4. The aortic valve is tricuspid. Aortic valve regurgitation is not  visualized. No aortic stenosis is present.   5. The inferior vena cava is normal in size with greater than 50%  respiratory variability, suggesting right atrial pressure of 3 mmHg.   07/08/21; LHC 1. Normal filling pressures and normal cardiac output.  2. No significant CAD.  Nonischemic cardiomyopathy.   Laboratory Data:  High Sensitivity Troponin:   Recent Labs  Lab 05/25/22 0705  TROPONINIHS 72*     Chemistry Recent Labs  Lab 05/25/22 0705 05/25/22 0751  NA 141 139  K 4.2 4.0  CL 94* 97*  CO2 30  --   GLUCOSE 128* 113*  BUN 46* 43*  CREATININE 12.46* 13.80*  CALCIUM 8.2*  --   MG 2.6*  --   GFRNONAA 4*  --   ANIONGAP 17*  --     Recent Labs  Lab 05/25/22 0705  PROT 7.3  ALBUMIN 3.3*  AST 17  ALT 18  ALKPHOS 59  BILITOT 0.7   Lipids No results for input(s): "CHOL", "TRIG", "HDL", "LABVLDL", "LDLCALC", "CHOLHDL" in the last 168 hours.  Hematology Recent Labs  Lab 05/25/22 0705 05/25/22 0751  WBC 6.0  --   RBC 3.56*  --   HGB 11.7* 11.9*  HCT 36.0* 35.0*  MCV 101.1*  --   MCH 32.9  --   MCHC 32.5  --   RDW 15.4  --   PLT 199  --    Thyroid No results for input(s): "TSH", "FREET4" in the last 168 hours.  BNP Recent Labs  Lab 05/25/22 0705  BNP 1,355.4*    DDimer No results for input(s): "DDIMER" in the last 168 hours.   Radiology/Studies:  DG Chest Portable 1 View Result Date: 05/25/2022 CLINICAL DATA:  Shortness of breath. EXAM: PORTABLE CHEST 1 VIEW COMPARISON:   January 11, 2021. FINDINGS: Stable cardiomegaly. Stable position of defibrillator is noted. Right lung is clear. Left lung base is not included in field-of-view and therefore pathology in this area cannot be excluded. Bony thorax is unremarkable. IMPRESSION: Left lung base is not included in field-of-view and therefore pathology in this area cannot be excluded. No definite acute abnormality is noted in the visualized lung fields. Electronically Signed   By: Marijo Conception M.D.   On: 05/25/2022 08:06     Assessment and Plan:   VT storm He continues to have very frequent non-sustained PMVT episodes Ordered another amio bolus Keep amio gtt running at '60mg'$ /hr, no down-titration I have spoken to [facility] rep, he will be in to check his device  Unclear the trigger for his VT outside of his baseline CM Lytes look ok, he does not appear volume OL with good HD compliance No CP, HS Trops only mildly abnormal and likely from the VT and ESRF  Dr. Lovena Le has seen the patient   Medicine team is admitting He will need ICU bed Nephrology team has been  bedside already    Risk Assessment/Risk Scores:    For questions or updates, please contact [place] Please consult [email or web address] for contact info under    Signed, Baldwin Jamaica, PA-C  05/25/2022 9:54 AM  EP Attending  Patient seen and examined. The patient is known to me from undergoing S-ICD gen change out about 9 months ago. He has an ICM and patent coronary arteries and an EF of 20%. He has ESRD on HD. He presented today with an ICD shock and was found in the ED to have recurrent and fairly long runs of PMVT. His troponins have been unremarkable. He did not have angina when I saw him. He was a little anxious. Exam demonstrates a pleasant 61 yo man, NAD. Lungs reveal rales bilaterally but no increased work of breathing. Ext are warm and CV reveals an IR rhythm. He has a non-focal neuro exam. His tele demonstrates NSR with PMVT.  A/P PMVT  - he has had a single ICD shock on ICD interrogation done under my direction. We will continue IV amiodarone and if bp allows also add some IV metoprolol. ICM - he has no evidence of angina and his troponins are fairly unremarkable. He had no obstructive CAD by cath 9 months ago. I would recommend watchful waiting.  Chronic systolic heart failure -He does not have overt volume overload but is certainly at risk for worsening. He cannot be diuresed and would require HD/CVVH for control of his volume.    Carleene Overlie Arlin Sass,MD

## 2022-05-25 NOTE — Progress Notes (Signed)
PCCM Progress Note  He was given amiodarone bolus for increased PVC frequency at 16:30 PM. Also started on BiPAP. Will attempt dialysis. Unable to start CRRT due to patient unable to lay down. Discussed plan with Cardiology and Nephrology.  At 1800 repeat amio bolus x 2 given for persistent arrhythmia with runs of polymorphic VT. Patient with new onset chest, nausea. BiPAP discontinued. Given calcium, bicarb x 1 amp, insulin and dextrose for presumed hyperkalemia. Cardiology called and at bedside. Patient allergic to lidocaine (tongue swelling) so was given metoprolol 2.5 mg x 2 with some improvement. Concerned volume overload is driving his condition.  Dialysis initiated with neo gtt for support. Discussed with patient high risk of needing intubation tonight and possible cardioversion though may not be effective. He expresses that he does not want to be intubated until absolutely necessary.   Elink and ground team notified.  Critical Care Time: 90 min  Rodman Pickle, M.D. [facility] Pulmonary/Critical Care Medicine 05/25/2022 7:07 PM   Please see Amion for pager number to reach on-call Pulmonary and Critical Care Team.

## 2022-05-25 NOTE — Plan of Care (Signed)
Cardiology plan of care note  Briefly 61 yo M with hx of NICM, last LHC 2022, HFrEF (25% in 2022, has ESRD and has never had CMR) who has two episodes of VT s/p Shock   One shock  this AM: 5:48 .  He has been in intermittent PMVT since then; he has had three untreated shocks.  Morphology is wide and is less suggestive of Afib with aberrancy (QRS ~ 190).  He has a VT conditional zone of 200 and VT shock zone 230 bpm   Despite Mag of 2.8, Amiodarone drip and multiple amiodarone boluses, he has has runs of PMVT (slow VT 100-120s)  Tolerate metoprolol 2.5 mg IV (he is now or neo for BP support)  I have started ranexa 500 mg PO BID (one dose received)  He has started HD.    In combination of this he has improved, NSVT burden has improved. Rate is lower.  We hope that with HD, he will be improve his volume status and decrease his PMVT burden  He has tongue swelling with lidocaine  I worry adding quinidine will not help  If worsening he have talked with patient and fiance about pseudo-elective intubation, we have discussed with our critical care colleagues as well  If refractor nausea, given QT concerns, low dose IV ativan may be reasonable, we hope this will improve with HD   CRITICAL CARE Performed by: Velera Lansdale A Chancey Cullinane  Total critical care time: 30  minutes. Critical care time was exclusive of separately billable procedures and treating other patients. Critical care was necessary to treat or prevent imminent or life-threatening deterioration. Critical care was time spent personally by me on the following activities: development of treatment plan with patient and/or surrogate as well as nursing, discussions with consultants, evaluation of patient's response to treatment, examination of patient, obtaining history from patient or surrogate, ordering and performing treatments and interventions, ordering and review of laboratory studies, ordering and review of radiographic studies, pulse  oximetry and re-evaluation of patient's condition.    Signed, Rudean Haskell, [place]  05/25/2022 8:00 PM

## 2022-05-25 NOTE — Progress Notes (Signed)
An USGPIV (ultrasound guided PIV) has been placed for short-term vasopressor infusion. A correctly placed ivWatch must be used when administering Vasopressors. Should this treatment be needed beyond 72 hours, central line access should be obtained.  It will be the responsibility of the bedside nurse to follow best practice to prevent extravasations.   ?

## 2022-05-25 NOTE — Progress Notes (Signed)
Hd tx completed. Pt tolerated tx well. R avf w/out s/s of complications. Pt aox4, no sob noted on 6lnc. Report given to genisis dancer, rn.  Total uf removed: 3064m  Medication given: none Post hd vs: 119/67 (85) 97.9  89 18 100% Post hd weight: 127.3kg

## 2022-05-25 NOTE — ED Triage Notes (Signed)
Pt BIB EMS from dialysis. This AM pt felt defibrillator shock him around 5:50 while at dialysis. EMS reports bradycardia with runs of vtach. EMS gave 300 amio, 79mq of sodium bicarb through dialysis access. Pt has remained Aox4 the entire time. Pt reports flutter feeling in his chest.  BP with EMS 140/100 95% RA

## 2022-05-25 NOTE — Plan of Care (Signed)
Initially consulted to admit patient after ICD firing with concern for ventricular tachycardia currently hemodynamically stable on amiodarone drip.  Cardiology evaluated and recommended ICU level of care.  Therefore deferred admission and notified Steve Minor, PA of PCCM who will evaluate patient for admission.

## 2022-05-26 DIAGNOSIS — I5022 Chronic systolic (congestive) heart failure: Secondary | ICD-10-CM | POA: Diagnosis not present

## 2022-05-26 DIAGNOSIS — I255 Ischemic cardiomyopathy: Secondary | ICD-10-CM | POA: Diagnosis not present

## 2022-05-26 DIAGNOSIS — I472 Ventricular tachycardia, unspecified: Secondary | ICD-10-CM | POA: Diagnosis not present

## 2022-05-26 DIAGNOSIS — I5043 Acute on chronic combined systolic (congestive) and diastolic (congestive) heart failure: Secondary | ICD-10-CM

## 2022-05-26 DIAGNOSIS — N186 End stage renal disease: Secondary | ICD-10-CM | POA: Diagnosis not present

## 2022-05-26 DIAGNOSIS — Z4502 Encounter for adjustment and management of automatic implantable cardiac defibrillator: Secondary | ICD-10-CM | POA: Diagnosis not present

## 2022-05-26 LAB — RENAL FUNCTION PANEL
Albumin: 3.4 g/dL — ABNORMAL LOW (ref 3.5–5.0)
Anion gap: 19 — ABNORMAL HIGH (ref 5–15)
BUN: 28 mg/dL — ABNORMAL HIGH (ref 6–20)
CO2: 26 mmol/L (ref 22–32)
Calcium: 8.5 mg/dL — ABNORMAL LOW (ref 8.9–10.3)
Chloride: 90 mmol/L — ABNORMAL LOW (ref 98–111)
Creatinine, Ser: 8.91 mg/dL — ABNORMAL HIGH (ref 0.61–1.24)
GFR, Estimated: 6 mL/min — ABNORMAL LOW (ref >60)
Glucose, Bld: 120 mg/dL — ABNORMAL HIGH (ref 70–99)
Phosphorus: 5.3 mg/dL — ABNORMAL HIGH (ref 2.5–4.6)
Potassium: 5.4 mmol/L — ABNORMAL HIGH (ref 3.5–5.1)
Sodium: 135 mmol/L (ref 135–145)

## 2022-05-26 LAB — CBC
HCT: 38.1 % — ABNORMAL LOW (ref 39.0–52.0)
Hemoglobin: 12.8 g/dL — ABNORMAL LOW (ref 13.0–17.0)
MCH: 33 pg (ref 26.0–34.0)
MCHC: 33.6 g/dL (ref 30.0–36.0)
MCV: 98.2 fL (ref 80.0–100.0)
Platelets: 242 10*3/uL (ref 150–400)
RBC: 3.88 MIL/uL — ABNORMAL LOW (ref 4.22–5.81)
RDW: 15.9 % — ABNORMAL HIGH (ref 11.5–15.5)
WBC: 8.4 10*3/uL (ref 4.0–10.5)
nRBC: 0 % (ref 0.0–0.2)

## 2022-05-26 LAB — MAGNESIUM: Magnesium: 2 mg/dL (ref 1.7–2.4)

## 2022-05-26 LAB — LACTIC ACID, PLASMA: Lactic Acid, Venous: 1.4 mmol/L (ref 0.5–1.9)

## 2022-05-26 MED ORDER — MELATONIN 3 MG PO TABS
ORAL_TABLET | ORAL | Status: AC
  Filled 2022-05-26: qty 1

## 2022-05-26 MED ORDER — CHLORHEXIDINE GLUCONATE CLOTH 2 % EX PADS: 6.0000 | MEDICATED_PAD | Freq: Every day | CUTANEOUS | Status: DC

## 2022-05-26 MED ORDER — CHLORHEXIDINE GLUCONATE CLOTH 2 % EX PADS
6.0000 | MEDICATED_PAD | Freq: Every day | CUTANEOUS | Status: DC
Start: 2022-05-27 — End: 2022-05-27

## 2022-05-26 MED ORDER — SODIUM ZIRCONIUM CYCLOSILICATE 10 G PO PACK
10.0000 g | PACK | Freq: Once | ORAL | Status: AC
  Administered 2022-05-26: 10 g via ORAL
  Filled 2022-05-26: qty 1

## 2022-05-26 MED ORDER — MELATONIN 3 MG PO TABS
3.0000 mg | ORAL_TABLET | Freq: Once | ORAL | Status: AC | PRN
  Administered 2022-05-26: 3 mg via ORAL

## 2022-05-26 MED ORDER — DOXERCALCIFEROL 4 MCG/2ML IV SOLN
6.0000 ug | INTRAVENOUS | Status: DC
Start: 2022-05-27 — End: 2022-05-28
  Administered 2022-05-27: 6 ug via INTRAVENOUS
  Filled 2022-05-26: qty 4

## 2022-05-26 MED ORDER — PHENYLEPHRINE-MINERAL OIL-PET 0.25-14-74.9 % RE OINT: 1.0000 | TOPICAL_OINTMENT | Freq: Two times a day (BID) | RECTAL | Status: DC | PRN

## 2022-05-26 NOTE — TOC Progression Note (Signed)
Transition of Care [facility]) - Progression Note    Patient Details  Name: Victor Thompson MRN: [id number] Date of Birth: 09-27-1961  Transition [facility] CM/SW Contact  Zenon Mayo, RN Phone Number: 05/26/2022, 10:28 AM  Clinical Narrative:    Patient with ESRD who presented after having his AICD fire while undergoing dialysis. TOC will continue to follow for dc needs.        Expected Discharge Plan and Services                                                 Social Determinants of Health (SDOH) Interventions    Readmission Risk Interventions     No data to display

## 2022-05-26 NOTE — Progress Notes (Signed)
eLink Physician-Brief Progress Note [name] DOB: 1961-09-22 MRN: [id number]   Date of Service  05/26/2022  HPI/Events of Note  Patient asking for a sleep aid.  eICU Interventions  Melatonin ordered PRN.        Kerry Kass Breyer Tejera 05/26/2022, 10:35 PM

## 2022-05-26 NOTE — Progress Notes (Addendum)
Progress Note  [name] Date of Encounter: 05/26/2022  [facility] Cardiologist: Dr. Radford Pax  Subjective   I'm hungry  Inpatient Medications    Scheduled Meds:  amiodarone  150 mg Intravenous Once   Chlorhexidine Gluconate Cloth  6 each Topical Q0600   Chlorhexidine Gluconate Cloth  6 each Topical Q0600   [START ON 05/27/2022] doxercalciferol  6 mcg Intravenous Q T,Th,Sa-HD   heparin  5,000 Units Subcutaneous Q8H   pantoprazole (PROTONIX) IV  40 mg Intravenous QHS   ranolazine  500 mg Oral BID   Continuous Infusions:  sodium chloride Stopped (05/25/22 1809)   sodium chloride 10 mL/hr at 05/26/22 0600   amiodarone 30 mg/hr (05/26/22 0830)   phenylephrine (NEO-SYNEPHRINE) Adult infusion 15 mcg/min (05/26/22 0600)   PRN Meds: acetaminophen, albuterol, docusate sodium, heparin lock flush, metoprolol tartrate, mouth rinse, polyethylene glycol   Vital Signs    Vitals:   05/26/22 0904 05/26/22 0907 05/26/22 0936 05/26/22 0944  BP: 100/87  (!) 115/92 117/69  Pulse: 85     Resp: 10     Temp: 98.1 F (36.7 C)  98.1 F (36.7 C)   TempSrc: Oral     SpO2: 92%     Weight:  125.6 kg    Height:        Intake/Output Summary (Last 24 hours) at 05/26/2022 0950 Last data filed at 05/26/2022 0600 Gross per 24 hour  Intake 1382.37 ml  Output --  Net 1382.37 ml      05/26/2022    9:07 AM 05/25/2022    1:40 PM 05/25/2022    6:58 AM  Last 3 Weights  Weight (lbs) 276 lb 14.4 oz 268 lb 1.3 oz 268 lb 1.3 oz  Weight (kg) 125.6 kg 121.6 kg 121.6 kg      Telemetry    SR, PVCs intermittently, NSVT, VT burden MUCH improved - Personally Reviewed  ECG    Last night is VT 108bpm - Personally Reviewed  Physical Exam   GEN: No acute distress, getting HD.   Neck: No JVD Cardiac: RRR, no murmurs, rubs, or gallops.  Respiratory: CTA b/l (ant/lat) asuscultation. GI: Soft, nontender, non-distended  MS: No edema; No deformity. Neuro:  Nonfocal  Psych: Normal  affect   Labs    High Sensitivity Troponin:   Recent Labs  Lab 05/25/22 0705 05/25/22 0906 05/25/22 1349 05/25/22 1350  TROPONINIHS 72* 77* 78* 73*     Chemistry Recent Labs  Lab 05/25/22 0705 05/25/22 0751 05/25/22 1349 05/25/22 1911 05/26/22 0343  NA 141 139  --  137  137 135  K 4.2 4.0  --  5.5*  5.6* 5.4*  CL 94* 97*  --  94*  94* 90*  CO2 30  --   --  '22  23 26  '$ GLUCOSE 128* 113*  --  282*  283* 120*  BUN 46* 43*  --  51*  52* 28*  CREATININE 12.46* 13.80*  --  13.72*  13.53* 8.91*  CALCIUM 8.2*  --   --  8.2*  8.3* 8.5*  MG 2.6*  --  2.5*  --  2.0  PROT 7.3  --   --   --   --   ALBUMIN 3.3*  --   --  3.2* 3.4*  AST 17  --   --   --   --   ALT 18  --   --   --   --   ALKPHOS 59  --   --   --   --  BILITOT 0.7  --   --   --   --   GFRNONAA 4*  --   --  4*  4* 6*  ANIONGAP 17*  --   --  21*  20* 19*    Lipids No results for input(s): "CHOL", "TRIG", "HDL", "LABVLDL", "LDLCALC", "CHOLHDL" in the last 168 hours.  Hematology Recent Labs  Lab 05/25/22 0705 05/25/22 0751 05/26/22 0343  WBC 6.0  --  8.4  RBC 3.56*  --  3.88*  HGB 11.7* 11.9* 12.8*  HCT 36.0* 35.0* 38.1*  MCV 101.1*  --  98.2  MCH 32.9  --  33.0  MCHC 32.5  --  33.6  RDW 15.4  --  15.9*  PLT 199  --  242   Thyroid No results for input(s): "TSH", "FREET4" in the last 168 hours.  BNP Recent Labs  Lab 05/25/22 0705  BNP 1,355.4*    DDimer No results for input(s): "DDIMER" in the last 168 hours.   Radiology    DG CHEST PORT 1 VIEW Result Date: 05/25/2022 CLINICAL DATA:  Shortness of breath EXAM: PORTABLE CHEST 1 VIEW COMPARISON:  Chest x-ray 05/25/2022 FINDINGS: Heart is enlarged. Mediastinum appears stable. Stable appearing left-sided cardiac device and lead. Pulmonary vascular prominence and hazy opacities in the lower lung zones. No pleural effusion or pneumothorax visualized. IMPRESSION: Cardiomegaly with central pulmonary vascular prominence and hazy opacities which may  represent atelectasis or edema. Electronically Signed   By: Ofilia Neas M.D.   On: 05/25/2022 16:30   DG Chest Portable 1 View Result Date: 05/25/2022 CLINICAL DATA:  Shortness of breath. EXAM: PORTABLE CHEST 1 VIEW COMPARISON:  January 11, 2021. FINDINGS: Stable cardiomegaly. Stable position of defibrillator is noted. Right lung is clear. Left lung base is not included in field-of-view and therefore pathology in this area cannot be excluded. Bony thorax is unremarkable. IMPRESSION: Left lung base is not included in field-of-view and therefore pathology in this area cannot be excluded. No definite acute abnormality is noted in the visualized lung fields. Electronically Signed   By: Marijo Conception M.D.   On: 05/25/2022 08:06    Cardiac Studies   10/21/21: TTE  1. Left ventricular ejection fraction, by estimation, is 25 to 30%. The  left ventricle has severely decreased function. The left ventricle  demonstrates global hypokinesis. The left ventricular internal cavity size  was moderately dilated. Left  ventricular diastolic parameters are consistent with Grade I diastolic  dysfunction (impaired relaxation).   2. Right ventricular systolic function is normal. The right ventricular  size is normal. There is normal pulmonary artery systolic pressure. The  estimated right ventricular systolic pressure is 93.5 mmHg.   3. The mitral valve is normal in structure. Mild mitral valve  regurgitation. No evidence of mitral stenosis.   4. The aortic valve is tricuspid. Aortic valve regurgitation is not  visualized. No aortic stenosis is present.   5. The inferior vena cava is normal in size with greater than 50%  respiratory variability, suggesting right atrial pressure of 3 mmHg.    07/08/21; LHC 1. Normal filling pressures and normal cardiac output.  2. No significant CAD.  Nonischemic cardiomyopathy.   Patient Profile     61 y.o. male hx of VF arrest (2015), CAD (PCI in 2005, unknown  details), ICM,. Chronic CHF (systolic), ESRF on HD , OSA w/CPAP, HTN, HLD, recurrent angio edema admitted with VT storm after a shock from his ICD at HD yesterday   Device information  BSCi S-ICD implanted  elsewhere 04/11/2014 > gen change 10/13/2020  Assessment & Plan    VT storm Had progressive SOB and respiratory insufficiency last night and another surge of VT last evening, hemodynamically unstable requiring add on pressor 2/neo-synephrine Had urgent HD last night that helped both significantly HD started this Am already Rhythm is much improved Try to get the pressor off today, d/w RN  Keep amio at '60mg'$ /hour today  Acute/chronic CHF (systolic) Managed with dialysis  CAD No anginal symptoms No obstructive CAD by cath Aug 2022 [place]  ESRF/HD Nephrology on board  5. Nausea He thinks 2/2 hunger, has diet order Will get EKG, last EKG was in VT Will check QT this AM  Appreciate CCM   Dr. Lovena Le has seen the patient this AM  ADDEND: SR 80bpm, PVC, QRS is 156 and adds to his QTc, though given that still borderline, will try to avoid zofran, hopefully nausea settles after he eats  For questions or updates, please contact [place] Please consult [email or web address] for contact info under        Signed, Baldwin [place], PA-C  05/26/2022, 9:50 AM    EP attending  He is improved this morning. Review of his tele demonstrates that the VT was much better after HD. Continue. Hopefully we can wean pressors off. Appreciate help from CCM.  Carleene Overlie Dietrick Barris,MD

## 2022-05-26 NOTE — Progress Notes (Signed)
Received patient in bed, alert and oriented. Informed consent signed and in chart.  Time tx initiated:0932  Pre HD weight:125.6  Pre HD VS:see chart  Time tx completed:1245  HD treatment completed. Patient tolerated well. Fistula/Graft/HD catheter without signs and symptoms of complications. Patient transported back to the room, alert and orient and in no acute distress. Report given to bedside RN.  Total UF removed:1.0  Medication given:none  Post HD VS: see chart  Post HD weight: 126.5  Pt completed prescribed tx, tolerated well.  No voiced concerns at this time.  Goal met.  Vitals remained stable throughout treatment.

## 2022-05-26 NOTE — Progress Notes (Addendum)
Pt receives out-pt HD at [facility] on TTS. Pt arrives at 5:15 am for 5:30 chair time. Will assist as needed.   Melven Sartorius Renal Navigator [phone]

## 2022-05-26 NOTE — Progress Notes (Addendum)
[facility] Progress Note  Name: Victor Thompson MRN: [id number] DOB: 06/26/1961  Chief Complaint:  ICD fired  Subjective:  Last HD on 6/20 - he had 3 kg UF of 4 kg goal.  He was transferred to the ICU from the ED as below.  He has been on phenylephrine at 15 mcg/min.  Still having issues with arrhythmia but better than before. He is ok with additional HD today.  He states on velphoro at home.  He asks if he can eat and I asked him to discuss with his team   Review of systems:  Breathing is much better today  Denies chest pain Nausea this am; no vomiting -------------- Background on consult:  Patient with a history of end-stage renal disease on hemodialysis TTS at [place], CAD, heart failure with reduced EF, AICD in situ and hypertension who presented to the hospital after his AICD fired in dialysis today.  He had run about an hour of HD then his AICD fired and EMS was called.  He was found to have vtach and was brought to the ER.  He had an amnio bolus and amio infusion initiated.  EP consulted.  Initially emergent IV access was obtained by EMS in the patient's fistula per ER attending report and this was later removed once the ER got IV access.     Seen this am  in the ER and again this afternoon in the ICU.  In the interim his respiratory status deteriorated 2/2 new pulmonary edema.    Intake/Output Summary (Last 24 hours) at 05/26/2022 0541 Last data filed at 05/26/2022 0400 Gross per 24 hour  Intake 1273.4 ml  Output --  Net 1273.4 ml    Vitals:  Vitals:   05/26/22 0415 05/26/22 0430 05/26/22 0445 05/26/22 0500  BP:    100/80  Pulse: (!) 49 73 69 80  Resp: (!) 0 (!) 9 (!) 5 (!) 0  Temp:      TempSrc:      SpO2: 99% 99% 99% 99%  Weight:      Height:         Physical Exam:  General adult male in bed in no acute distress HEENT normocephalic atraumatic extraocular movements intact sclera anicteric Neck supple trachea midline Lungs clear to auscultation  bilaterally normal work of breathing at rest on CPAP then transitioned to 4 liters Heart S1S2 tachy Abdomen soft nontender distended/obese habitus Extremities no edema  Psych normal mood and affect Neuro - alert and oriented x 3 provides hx and follows commands Access RUE AVF bruit and thrill    Medications reviewed   Labs:     Latest Ref Rng & Units 05/26/2022    3:43 AM 05/25/2022    7:11 PM 05/25/2022    7:51 AM  BMP  Glucose 70 - 99 mg/dL 120  283    282  113   BUN 6 - 20 mg/dL 28  52    51  43   Creatinine 0.61 - 1.24 mg/dL 8.91  13.53    13.72  13.80   Sodium 135 - 145 mmol/L 135  137    137  139   Potassium 3.5 - 5.1 mmol/L 5.4  5.6    5.5  4.0   Chloride 98 - 111 mmol/L 90  94    94  97   CO2 22 - 32 mmol/L '26  23    22    '$ Calcium 8.9 - 10.3 mg/dL 8.5  8.3  8.2     Dialysis Orders: [place] T,Th,S 4 hr 15 min 180NRe 450/Autoflow 1.5 120kg  UFP4 AVF -Heparin 3000 units IV TIW -Venofer 50 mg IV weekly -Hectorol 6 mcg IV TIW   Assessment/Plan:   # VT - EP consulting - on amio per critical care   # ESRD  - plan for additional HD today to optimize    - lokelma once today - outpatient schedule is TTS   # Acute hypoxic respiratory failure  - secondary to pulmonary edema - optimize volume with dialysis   # Heart failure reduced ejection fraction  - optimize volume with HD    # HTN - hx of such - optimize volume with HD - blood pressure has actually been running lower here - phenylephrine is on at a low rate   # Anemia CKD  - Hb 12.8 - no ESA   # Metabolic bone disease  - resumed hectorol.  When taking PO resume velphoro - right now NPO and not tolerating much  Claudia Desanctis, MD 05/26/2022 6:06 AM

## 2022-05-26 NOTE — Progress Notes (Signed)
NAME:  Victor Thompson, MRN:  [id number], DOB:  [birth year], LOS: 1 ADMISSION DATE:  05/25/2022, CONSULTATION DATE: 05/25/2022 REFERRING MD: Emergency department, CHIEF COMPLAINT: AICD firing  History of Present Illness:  Victor Thompson is a 61 year old male former smoker who was in dialysis at [facility] and after approximately 1 hour he noticed his AICD fired off.  He is transferred to [facility] emergency department where pulmonary critical care called to the bedside for admission for hypotension. He is got extensive past medical history is well-documented below.  He states he can sense when his AICD is going to fire off as he knows his Lifef[employer] does decline just before it firings off.  He will be admitted to the intensive care unit for further evaluation and treatment.  Pertinent  Medical History   Past Medical History:  Diagnosis Date   AICD (automatic cardioverter/defibrillator) present    2015   Asthma    no meds   Cardiac arrest ([place]) 2015   CHF (congestive heart failure) (HCC)    Chronic kidney disease    ckd -stage 5   Coronary artery disease    Hypertension    Myocardial infarction (HCC)    Obesity    Pneumonia    Pulmonary edema    Shortness of breath dyspnea    Sleep apnea    USES CPAP   Wears glasses      Significant Hospital Events: Including procedures, antibiotic start and stop dates in addition to other pertinent events   05/25/2022 AICD firing 05/25/2022 admit to the intensive care unit under critical care service  Interim History / Subjective:  Had multiple episodes of PMVT yesterday, received multiple boluses of amiodarone, also he received hemodialysis overnight with improvement in cardiac rhythm Remains on low-dose phenylephrine This morning he denies chest pain, nausea, vomiting, states he is hungry and would like to eat  Objective   Blood pressure 118/80, pulse 86, temperature 98 F (36.7 C), temperature source Axillary, resp. rate (!) 28, height '5\' 7"'$   (1.702 m), weight 121.6 kg, SpO2 96 %.    FiO2 (%):  [40 %] 40 %   Intake/Output Summary (Last 24 hours) at 05/26/2022 0749 Last data filed at 05/26/2022 0600 Gross per 24 hour  Intake 1382.37 ml  Output --  Net 1382.37 ml   Filed Weights   05/25/22 0658 05/25/22 1340  Weight: 121.6 kg 121.6 kg    Examination: Physical exam: General: Acute on chronically ill-appearing male, lying on the bed HEENT: [place]/AT, eyes anicteric.  moist mucus membranes Neuro: Alert, awake following commands Chest: Coarse breath sounds, no wheezes or rhonchi Heart: Regular rate and rhythm, no murmurs or gallops Abdomen: Soft, nontender, nondistended, bowel sounds present Skin: No rash   Resolved Hospital Problem list     Assessment & Plan:  Polymorphic VT's with AICD discharge during hemodialysis  Appreciate cardiology and EP follow-up After interrogation of AICD, it was confirmed AICD fired yesterday Received multiple doses of Amio bolus Continue amiodarone infusion  Keep magnesium >2 and potassium 4-5  Acute on chronic combined systolic/diastolic congestive heart failure Acute hypoxic respiratory failure Patient was volume overloaded, requiring BiPAP therapy Now on 3 L oxygen via nasal cannula Monitor intake and output Continue goal-directed therapy  End-stage renal disease Hyperkalemia Patient received dialysis yesterday, plan to dialyzing today again Nephrology is following Closely monitor electrolytes  Obstructive sleep apnea CPAP nightly  Intermittent asthma Continue bronchodilators as needed  Morbid obesity Dietitian is following Counseling provided regarding diet  and exercise  Best Practice (right click and "Reselect all SmartList Selections" daily)   Diet/type: Full liquid diet, advance as tolerated DVT prophylaxis: prophylactic heparin  GI prophylaxis: PPI Lines: N/A Foley:  N/A Code Status:  full code Last date of multidisciplinary goals of care discussion  [pending]  Labs   CBC: Recent Labs  Lab 05/25/22 0705 05/25/22 0751 05/26/22 0343  WBC 6.0  --  8.4  NEUTROABS 4.4  --   --   HGB 11.7* 11.9* 12.8*  HCT 36.0* 35.0* 38.1*  MCV 101.1*  --  98.2  PLT 199  --  357    Basic Metabolic Panel: Recent Labs  Lab 05/25/22 0705 05/25/22 0751 05/25/22 1349 05/25/22 1911 05/26/22 0343  NA 141 139  --  137  137 135  K 4.2 4.0  --  5.5*  5.6* 5.4*  CL 94* 97*  --  94*  94* 90*  CO2 30  --   --  '22  23 26  '$ GLUCOSE 128* 113*  --  282*  283* 120*  BUN 46* 43*  --  51*  52* 28*  CREATININE 12.46* 13.80*  --  13.72*  13.53* 8.91*  CALCIUM 8.2*  --   --  8.2*  8.3* 8.5*  MG 2.6*  --  2.5*  --  2.0  PHOS  --   --  7.9* 8.0* 5.3*   GFR: Estimated Creatinine Clearance: 11.3 mL/min (A) (by C-G formula based on SCr of 8.91 mg/dL (H)). Recent Labs  Lab 05/25/22 0705 05/25/22 1349 05/25/22 1350 05/25/22 1911 05/26/22 0343  PROCALCITON  --   --  0.52  --   --   WBC 6.0  --   --   --  8.4  LATICACIDVEN  --  1.9  --  4.1* 1.4    Liver Function Tests: Recent Labs  Lab 05/25/22 0705 05/25/22 1911 05/26/22 0343  AST 17  --   --   ALT 18  --   --   ALKPHOS 59  --   --   BILITOT 0.7  --   --   PROT 7.3  --   --   ALBUMIN 3.3* 3.2* 3.4*   Recent Labs  Lab 05/25/22 1350  LIPASE 35  AMYLASE 91   No results for input(s): "AMMONIA" in the last 168 hours.  ABG    Component Value Date/Time   PHART 7.404 07/08/2021 1111   PHART 7.398 07/08/2021 1111   PCO2ART 55.8 (H) 07/08/2021 1111   PCO2ART 56.9 (H) 07/08/2021 1111   PO2ART 41 (L) 07/08/2021 1111   PO2ART 41 (L) 07/08/2021 1111   HCO3 34.9 (H) 07/08/2021 1111   HCO3 35.1 (H) 07/08/2021 1111   TCO2 28 05/25/2022 0751   O2SAT 75.0 07/08/2021 1111   O2SAT 74.0 07/08/2021 1111     Coagulation Profile: Recent Labs  Lab 05/25/22 1349  INR 1.1    Cardiac Enzymes: No results for input(s): "CKTOTAL", "CKMB", "CKMBINDEX", "TROPONINI" in the last 168  hours.  HbA1C: Hemoglobin A1C  Date/Time Value Ref Range Status  12/22/2015 03:22 PM 5.90  Final    CBG: Recent Labs  Lab 05/25/22 1810  GLUCAP 146*    Critical care time:     Total critical care time: 41 minutes  Performed by: [place] care time was exclusive of separately billable procedures and treating other patients.   Critical care was necessary to treat or prevent imminent or life-threatening deterioration.   Critical  care was time spent personally by me on the following activities: development of treatment plan with patient and/or surrogate as well as nursing, discussions with consultants, evaluation of patient's response to treatment, examination of patient, obtaining history from patient or surrogate, ordering and performing treatments and interventions, ordering and review of laboratory studies, ordering and review of radiographic studies, pulse oximetry and re-evaluation of patient's condition.   Jacky Kindle, MD [facility] See Amion for pager If no response to pager, please call [phone] until 7pm After 7pm, Please call E-link [phone]

## 2022-05-27 DIAGNOSIS — I255 Ischemic cardiomyopathy: Secondary | ICD-10-CM | POA: Diagnosis not present

## 2022-05-27 DIAGNOSIS — I472 Ventricular tachycardia, unspecified: Secondary | ICD-10-CM | POA: Diagnosis not present

## 2022-05-27 DIAGNOSIS — Z4502 Encounter for adjustment and management of automatic implantable cardiac defibrillator: Secondary | ICD-10-CM | POA: Diagnosis not present

## 2022-05-27 DIAGNOSIS — I5022 Chronic systolic (congestive) heart failure: Secondary | ICD-10-CM | POA: Diagnosis not present

## 2022-05-27 DIAGNOSIS — N186 End stage renal disease: Secondary | ICD-10-CM | POA: Diagnosis not present

## 2022-05-27 LAB — CBC WITH DIFFERENTIAL/PLATELET
Abs Immature Granulocytes: 0.03 10*3/uL (ref 0.00–0.07)
Basophils Absolute: 0 10*3/uL (ref 0.0–0.1)
Basophils Relative: 0 %
Eosinophils Absolute: 0 10*3/uL (ref 0.0–0.5)
Eosinophils Relative: 0 %
HCT: 35.9 % — ABNORMAL LOW (ref 39.0–52.0)
Hemoglobin: 12.1 g/dL — ABNORMAL LOW (ref 13.0–17.0)
Immature Granulocytes: 0 %
Lymphocytes Relative: 8 %
Lymphs Abs: 0.6 10*3/uL — ABNORMAL LOW (ref 0.7–4.0)
MCH: 33.1 pg (ref 26.0–34.0)
MCHC: 33.7 g/dL (ref 30.0–36.0)
MCV: 98.1 fL (ref 80.0–100.0)
Monocytes Absolute: 1 10*3/uL (ref 0.1–1.0)
Monocytes Relative: 13 %
Neutro Abs: 6 10*3/uL (ref 1.7–7.7)
Neutrophils Relative %: 79 %
Platelets: 173 10*3/uL (ref 150–400)
RBC: 3.66 MIL/uL — ABNORMAL LOW (ref 4.22–5.81)
RDW: 15.5 % (ref 11.5–15.5)
WBC: 7.6 10*3/uL (ref 4.0–10.5)
nRBC: 0 % (ref 0.0–0.2)

## 2022-05-27 LAB — RENAL FUNCTION PANEL
Albumin: 2.9 g/dL — ABNORMAL LOW (ref 3.5–5.0)
Anion gap: 13 (ref 5–15)
BUN: 26 mg/dL — ABNORMAL HIGH (ref 6–20)
CO2: 28 mmol/L (ref 22–32)
Calcium: 8.1 mg/dL — ABNORMAL LOW (ref 8.9–10.3)
Chloride: 90 mmol/L — ABNORMAL LOW (ref 98–111)
Creatinine, Ser: 8.35 mg/dL — ABNORMAL HIGH (ref 0.61–1.24)
GFR, Estimated: 7 mL/min — ABNORMAL LOW (ref >60)
Glucose, Bld: 105 mg/dL — ABNORMAL HIGH (ref 70–99)
Phosphorus: 6.1 mg/dL — ABNORMAL HIGH (ref 2.5–4.6)
Potassium: 4 mmol/L (ref 3.5–5.1)
Sodium: 131 mmol/L — ABNORMAL LOW (ref 135–145)

## 2022-05-27 LAB — HEPATITIS B SURFACE ANTIBODY, QUANTITATIVE: Hep B S AB Quant (Post): 244.2 m[IU]/mL (ref >9.9)

## 2022-05-27 MED ORDER — LIDOCAINE HCL (PF) 1 % IJ SOLN: 5.0000 mL | INTRAMUSCULAR | Status: DC | PRN

## 2022-05-27 MED ORDER — SUCROFERRIC OXYHYDROXIDE 500 MG PO CHEW
1000.0000 mg | CHEWABLE_TABLET | Freq: Three times a day (TID) | ORAL | Status: DC
  Filled 2022-05-27 (×2): qty 2

## 2022-05-27 MED ORDER — PENTAFLUOROPROP-TETRAFLUOROETH EX AERO
1.0000 | INHALATION_SPRAY | CUTANEOUS | Status: DC | PRN
  Administered 2022-05-27: 1 via TOPICAL
  Filled 2022-05-27: qty 103.5

## 2022-05-27 MED ORDER — AMIODARONE HCL 200 MG PO TABS
400.0000 mg | ORAL_TABLET | Freq: Two times a day (BID) | ORAL | Status: DC
  Administered 2022-05-27 – 2022-05-28 (×3): 400 mg via ORAL
  Filled 2022-05-27 (×3): qty 2

## 2022-05-27 MED ORDER — SUCROFERRIC OXYHYDROXIDE 500 MG PO CHEW
500.0000 mg | CHEWABLE_TABLET | Freq: Three times a day (TID) | ORAL | Status: DC
  Administered 2022-05-27 – 2022-05-28 (×4): 500 mg via ORAL
  Filled 2022-05-27 (×7): qty 1

## 2022-05-27 MED ORDER — PANTOPRAZOLE SODIUM 40 MG PO TBEC
40.0000 mg | DELAYED_RELEASE_TABLET | Freq: Every day | ORAL | Status: DC
  Administered 2022-05-28: 40 mg via ORAL
  Filled 2022-05-27: qty 1

## 2022-05-27 NOTE — Progress Notes (Signed)
Delay in getting started w/ HD tx as once the machine was turned on it needed to be tested for mincare, then after set up, the water check came back blue which required a second rinse cycle. Will wait for the rinse cycle to complete and re test. If it is clear at that time I will proceed w/ continuing starting tx.

## 2022-05-27 NOTE — Progress Notes (Signed)
Pt placed on CPAP for bed w/ 3L bleed in. RT will cont to monitor as needed.

## 2022-05-27 NOTE — Progress Notes (Signed)
VAST consulted to obtain IV access. Pt with restricted right arm d/t dialysis fistula. Left lower arm extremely swollen and painful to touch per patient. Left AC also painful per patient (he winces and moans when palpated). Amiodarone has been changed to PO. After conferencing with patient's nurse, advised patient we are going to leave IV access out for now to allow the swelling and pain in his left arm to dissipate. Instructed patient to keep left arm propped up on pillows to help reduce swelling. He verbalized understanding.

## 2022-05-28 ENCOUNTER — Encounter (HOSPITAL_COMMUNITY): Payer: Self-pay | Admitting: Pulmonary Disease

## 2022-05-28 ENCOUNTER — Other Ambulatory Visit: Payer: Self-pay

## 2022-05-28 DIAGNOSIS — I5023 Acute on chronic systolic (congestive) heart failure: Secondary | ICD-10-CM | POA: Diagnosis not present

## 2022-05-28 DIAGNOSIS — J9601 Acute respiratory failure with hypoxia: Secondary | ICD-10-CM

## 2022-05-28 DIAGNOSIS — J9600 Acute respiratory failure, unspecified whether with hypoxia or hypercapnia: Secondary | ICD-10-CM

## 2022-05-28 DIAGNOSIS — I2510 Atherosclerotic heart disease of native coronary artery without angina pectoris: Secondary | ICD-10-CM | POA: Diagnosis not present

## 2022-05-28 DIAGNOSIS — J811 Chronic pulmonary edema: Secondary | ICD-10-CM

## 2022-05-28 DIAGNOSIS — I472 Ventricular tachycardia, unspecified: Secondary | ICD-10-CM | POA: Diagnosis not present

## 2022-05-28 DIAGNOSIS — I5042 Chronic combined systolic (congestive) and diastolic (congestive) heart failure: Secondary | ICD-10-CM | POA: Diagnosis not present

## 2022-05-28 HISTORY — DX: Acute respiratory failure, unspecified whether with hypoxia or hypercapnia: J96.00

## 2022-05-28 HISTORY — DX: Chronic pulmonary edema: J81.1

## 2022-05-28 LAB — RENAL FUNCTION PANEL
Albumin: 3.2 g/dL — ABNORMAL LOW (ref 3.5–5.0)
Anion gap: 16 — ABNORMAL HIGH (ref 5–15)
BUN: 38 mg/dL — ABNORMAL HIGH (ref 6–20)
CO2: 28 mmol/L (ref 22–32)
Calcium: 8.5 mg/dL — ABNORMAL LOW (ref 8.9–10.3)
Chloride: 91 mmol/L — ABNORMAL LOW (ref 98–111)
Creatinine, Ser: 8.27 mg/dL — ABNORMAL HIGH (ref 0.61–1.24)
GFR, Estimated: 7 mL/min — ABNORMAL LOW (ref >60)
Glucose, Bld: 116 mg/dL — ABNORMAL HIGH (ref 70–99)
Phosphorus: 4.2 mg/dL (ref 2.5–4.6)
Potassium: 3.9 mmol/L (ref 3.5–5.1)
Sodium: 135 mmol/L (ref 135–145)

## 2022-05-28 MED ORDER — CARVEDILOL 3.125 MG PO TABS
3.1250 mg | ORAL_TABLET | Freq: Two times a day (BID) | ORAL | 11 refills | Status: DC
  Filled 2022-05-28: qty 60, 30d supply, fill #0

## 2022-05-28 MED ORDER — AMIODARONE HCL 200 MG PO TABS
400.0000 mg | ORAL_TABLET | Freq: Two times a day (BID) | ORAL | 1 refills | Status: DC
  Filled 2022-05-28: qty 120, 30d supply, fill #0

## 2022-05-28 MED ORDER — CARVEDILOL 25 MG PO TABS
ORAL_TABLET | Freq: Two times a day (BID) | ORAL | 1 refills | Status: DC
  Filled 2022-05-28: qty 180, 90d supply, fill #0

## 2022-05-28 MED ORDER — ISOSORBIDE MONONITRATE ER 30 MG PO TB24
15.0000 mg | ORAL_TABLET | Freq: Every day | ORAL | 12 refills | Status: DC
  Filled 2022-05-28: qty 15, 30d supply, fill #0

## 2022-05-29 ENCOUNTER — Telehealth: Payer: Self-pay | Admitting: Nephrology

## 2022-05-29 DIAGNOSIS — Z992 Dependence on renal dialysis: Secondary | ICD-10-CM | POA: Diagnosis not present

## 2022-05-29 DIAGNOSIS — Z23 Encounter for immunization: Secondary | ICD-10-CM | POA: Diagnosis not present

## 2022-05-29 DIAGNOSIS — R52 Pain, unspecified: Secondary | ICD-10-CM | POA: Diagnosis not present

## 2022-05-29 DIAGNOSIS — N186 End stage renal disease: Secondary | ICD-10-CM | POA: Diagnosis not present

## 2022-05-29 DIAGNOSIS — D509 Iron deficiency anemia, unspecified: Secondary | ICD-10-CM | POA: Diagnosis not present

## 2022-05-29 DIAGNOSIS — N2581 Secondary hyperparathyroidism of renal origin: Secondary | ICD-10-CM | POA: Diagnosis not present

## 2022-05-29 DIAGNOSIS — D631 Anemia in chronic kidney disease: Secondary | ICD-10-CM | POA: Diagnosis not present

## 2022-05-29 NOTE — Telephone Encounter (Signed)
Transition of Care Contact from Inpatient Facility   Date of Discharge: 05/28/22 Date of Contact: 05/29/22 Method of contact: phone Talked to patient   Patient contacted to discuss transition of care form recent hospitaliztion. Patient was admitted to [facility] from 05/25/22 to 05/28/22 with the discharge diagnosis of Ventricular tachycardia annd acute hypoxic respiratory failure/pulmonary edema.  Medication changes were reviewed.   Patient will follow up with is outpatient dialysis center today 05/29/22.   Other follow up needs include none identified.   Virgina Norfolk, PA-C [employer]

## 2022-05-31 ENCOUNTER — Telehealth: Payer: Self-pay

## 2022-06-01 DIAGNOSIS — D509 Iron deficiency anemia, unspecified: Secondary | ICD-10-CM | POA: Diagnosis not present

## 2022-06-01 DIAGNOSIS — I509 Heart failure, unspecified: Secondary | ICD-10-CM | POA: Diagnosis not present

## 2022-06-01 DIAGNOSIS — Z992 Dependence on renal dialysis: Secondary | ICD-10-CM | POA: Diagnosis not present

## 2022-06-01 DIAGNOSIS — D631 Anemia in chronic kidney disease: Secondary | ICD-10-CM | POA: Diagnosis not present

## 2022-06-01 DIAGNOSIS — N2581 Secondary hyperparathyroidism of renal origin: Secondary | ICD-10-CM | POA: Diagnosis not present

## 2022-06-01 DIAGNOSIS — N186 End stage renal disease: Secondary | ICD-10-CM | POA: Diagnosis not present

## 2022-06-01 DIAGNOSIS — Z23 Encounter for immunization: Secondary | ICD-10-CM | POA: Diagnosis not present

## 2022-06-01 DIAGNOSIS — I472 Ventricular tachycardia, unspecified: Secondary | ICD-10-CM | POA: Diagnosis not present

## 2022-06-01 DIAGNOSIS — R52 Pain, unspecified: Secondary | ICD-10-CM | POA: Diagnosis not present

## 2022-06-03 ENCOUNTER — Telehealth: Payer: Self-pay

## 2022-06-03 DIAGNOSIS — Z992 Dependence on renal dialysis: Secondary | ICD-10-CM | POA: Diagnosis not present

## 2022-06-03 DIAGNOSIS — N2581 Secondary hyperparathyroidism of renal origin: Secondary | ICD-10-CM | POA: Diagnosis not present

## 2022-06-03 DIAGNOSIS — N186 End stage renal disease: Secondary | ICD-10-CM | POA: Diagnosis not present

## 2022-06-03 DIAGNOSIS — D509 Iron deficiency anemia, unspecified: Secondary | ICD-10-CM | POA: Diagnosis not present

## 2022-06-03 DIAGNOSIS — R52 Pain, unspecified: Secondary | ICD-10-CM | POA: Diagnosis not present

## 2022-06-03 DIAGNOSIS — D631 Anemia in chronic kidney disease: Secondary | ICD-10-CM | POA: Diagnosis not present

## 2022-06-03 DIAGNOSIS — Z23 Encounter for immunization: Secondary | ICD-10-CM | POA: Diagnosis not present

## 2022-06-03 NOTE — Telephone Encounter (Signed)
-----   Message from Baldwin Jamaica, [place] sent at 06/03/2022 12:52 PM EDT ----- Would you guys please follow up with this patient? He was hospitalized with VT storm and a shock Did very well with daily dialysis and adding amiodarone.  Would like if you could follow up with him and see how he is doing.  ALSO I do not think we discussed no driving with him..... Will you also please advise him of the driving restriction as well.  Thanks renee

## 2022-06-03 NOTE — Telephone Encounter (Signed)
Successful telephone encounter to patient to follow up post VT/VF during dialysis with ICD shock. Patient is home and states he is doing much better. He is compliant with all medications including his coreg and amiodarone as prescribed. He is not driving and aware of [place] driving restrictions. Shock plan reviewed. Patient is also provided device clinic contact at [phone] for any additional questions or concerns that may arise. Patient appreciative of follow up post hospitalization.

## 2022-06-04 DIAGNOSIS — N186 End stage renal disease: Secondary | ICD-10-CM | POA: Diagnosis not present

## 2022-06-04 DIAGNOSIS — I129 Hypertensive chronic kidney disease with stage 1 through stage 4 chronic kidney disease, or unspecified chronic kidney disease: Secondary | ICD-10-CM | POA: Diagnosis not present

## 2022-06-04 DIAGNOSIS — Z992 Dependence on renal dialysis: Secondary | ICD-10-CM | POA: Diagnosis not present

## 2022-06-05 DIAGNOSIS — N186 End stage renal disease: Secondary | ICD-10-CM | POA: Diagnosis not present

## 2022-06-05 DIAGNOSIS — I2510 Atherosclerotic heart disease of native coronary artery without angina pectoris: Secondary | ICD-10-CM | POA: Diagnosis not present

## 2022-06-05 DIAGNOSIS — D631 Anemia in chronic kidney disease: Secondary | ICD-10-CM | POA: Diagnosis not present

## 2022-06-05 DIAGNOSIS — D509 Iron deficiency anemia, unspecified: Secondary | ICD-10-CM | POA: Diagnosis not present

## 2022-06-05 DIAGNOSIS — Z992 Dependence on renal dialysis: Secondary | ICD-10-CM | POA: Diagnosis not present

## 2022-06-05 DIAGNOSIS — N2581 Secondary hyperparathyroidism of renal origin: Secondary | ICD-10-CM | POA: Diagnosis not present

## 2022-06-05 DIAGNOSIS — R52 Pain, unspecified: Secondary | ICD-10-CM | POA: Diagnosis not present

## 2022-06-08 DIAGNOSIS — D631 Anemia in chronic kidney disease: Secondary | ICD-10-CM | POA: Diagnosis not present

## 2022-06-08 DIAGNOSIS — N2581 Secondary hyperparathyroidism of renal origin: Secondary | ICD-10-CM | POA: Diagnosis not present

## 2022-06-08 DIAGNOSIS — D509 Iron deficiency anemia, unspecified: Secondary | ICD-10-CM | POA: Diagnosis not present

## 2022-06-08 DIAGNOSIS — N186 End stage renal disease: Secondary | ICD-10-CM | POA: Diagnosis not present

## 2022-06-08 DIAGNOSIS — R52 Pain, unspecified: Secondary | ICD-10-CM | POA: Diagnosis not present

## 2022-06-08 DIAGNOSIS — Z992 Dependence on renal dialysis: Secondary | ICD-10-CM | POA: Diagnosis not present

## 2022-06-08 DIAGNOSIS — I2510 Atherosclerotic heart disease of native coronary artery without angina pectoris: Secondary | ICD-10-CM | POA: Diagnosis not present

## 2022-06-10 ENCOUNTER — Telehealth: Payer: Self-pay

## 2022-06-10 ENCOUNTER — Other Ambulatory Visit: Payer: Self-pay

## 2022-06-10 DIAGNOSIS — N186 End stage renal disease: Secondary | ICD-10-CM

## 2022-06-10 DIAGNOSIS — Z992 Dependence on renal dialysis: Secondary | ICD-10-CM | POA: Diagnosis not present

## 2022-06-10 DIAGNOSIS — N2581 Secondary hyperparathyroidism of renal origin: Secondary | ICD-10-CM | POA: Diagnosis not present

## 2022-06-10 DIAGNOSIS — R52 Pain, unspecified: Secondary | ICD-10-CM | POA: Diagnosis not present

## 2022-06-10 DIAGNOSIS — D631 Anemia in chronic kidney disease: Secondary | ICD-10-CM | POA: Diagnosis not present

## 2022-06-10 DIAGNOSIS — D509 Iron deficiency anemia, unspecified: Secondary | ICD-10-CM | POA: Diagnosis not present

## 2022-06-10 DIAGNOSIS — Z86010 Personal history of colonic polyps: Secondary | ICD-10-CM

## 2022-06-10 DIAGNOSIS — I2510 Atherosclerotic heart disease of native coronary artery without angina pectoris: Secondary | ICD-10-CM | POA: Diagnosis not present

## 2022-06-10 NOTE — Progress Notes (Signed)
Colonoscopy at [facility] on 9-18 Monday at 8:15am.

## 2022-06-10 NOTE — Telephone Encounter (Signed)
Called and spoke to patient. Offered him Thurs 8-3 or Tues, 8-29 for his colonoscopy procedure at the hospital but He has dialysis on Tuesdays and Thursday mornings. Therefore I scheduled him for Monday, 9-18th. He confirmed that date. I have Scheduled him for a pre visit on Tuesday, 9-5 at 8:30 am.  Appointment Information sent to [place].

## 2022-06-11 ENCOUNTER — Encounter (HOSPITAL_COMMUNITY): Payer: Self-pay | Admitting: Cardiology

## 2022-06-11 ENCOUNTER — Ambulatory Visit (HOSPITAL_COMMUNITY)
Admit: 2022-06-11 | Discharge: 2022-06-11 | Disposition: A | Discharge status: Home / Self Care | Payer: Medicare Other | Source: Ambulatory Visit | Attending: Cardiology | Admitting: Cardiology

## 2022-06-11 ENCOUNTER — Other Ambulatory Visit: Payer: Self-pay

## 2022-06-11 VITALS — BP 120/78 | HR 84 | Wt 260.8 lb

## 2022-06-11 DIAGNOSIS — T783XXA Angioneurotic edema, initial encounter: Secondary | ICD-10-CM | POA: Insufficient documentation

## 2022-06-11 DIAGNOSIS — Z79899 Other long term (current) drug therapy: Secondary | ICD-10-CM | POA: Insufficient documentation

## 2022-06-11 DIAGNOSIS — I5042 Chronic combined systolic (congestive) and diastolic (congestive) heart failure: Secondary | ICD-10-CM | POA: Diagnosis not present

## 2022-06-11 DIAGNOSIS — Z7901 Long term (current) use of anticoagulants: Secondary | ICD-10-CM | POA: Diagnosis not present

## 2022-06-11 DIAGNOSIS — I2510 Atherosclerotic heart disease of native coronary artery without angina pectoris: Secondary | ICD-10-CM | POA: Insufficient documentation

## 2022-06-11 DIAGNOSIS — N186 End stage renal disease: Secondary | ICD-10-CM | POA: Diagnosis not present

## 2022-06-11 DIAGNOSIS — G4733 Obstructive sleep apnea (adult) (pediatric): Secondary | ICD-10-CM | POA: Insufficient documentation

## 2022-06-11 DIAGNOSIS — Z7982 Long term (current) use of aspirin: Secondary | ICD-10-CM | POA: Diagnosis not present

## 2022-06-11 DIAGNOSIS — Z95810 Presence of automatic (implantable) cardiac defibrillator: Secondary | ICD-10-CM | POA: Insufficient documentation

## 2022-06-11 DIAGNOSIS — Z8616 Personal history of COVID-19: Secondary | ICD-10-CM | POA: Diagnosis not present

## 2022-06-11 DIAGNOSIS — I132 Hypertensive heart and chronic kidney disease with heart failure and with stage 5 chronic kidney disease, or end stage renal disease: Secondary | ICD-10-CM | POA: Insufficient documentation

## 2022-06-11 DIAGNOSIS — I255 Ischemic cardiomyopathy: Secondary | ICD-10-CM | POA: Diagnosis not present

## 2022-06-11 MED ORDER — CARVEDILOL 6.25 MG PO TABS
3.1250 mg | ORAL_TABLET | Freq: Two times a day (BID) | ORAL | 11 refills | Status: DC
Start: 2022-06-11 — End: 2022-08-21
  Filled 2022-06-11: qty 60, 60d supply, fill #0
  Filled 2022-08-11: qty 60, 60d supply, fill #1

## 2022-06-11 NOTE — Patient Instructions (Signed)
Good to see you today!  In 1 week DECREASE Amiodarone to 200 mg Twice daily   Increase Coreg to 6.25 mg Twice daily  Labs done today, your results will be available in MyChart, we will contact you for abnormal readings.  Your physician recommends that you schedule a follow-up appointment in: 3 months with app clinic  Call office in August to schedule an appointment  If you have any questions or concerns before your next appointment please send [place] a message through [place] or call our office at [phone].    TO LEAVE A MESSAGE FOR THE NURSE SELECT OPTION 2, PLEASE LEAVE A MESSAGE INCLUDING: YOUR NAME DATE OF BIRTH CALL BACK NUMBER REASON FOR CALL**this is important as we prioritize the call backs  YOU WILL RECEIVE A CALL BACK THE SAME DAY AS LONG AS YOU CALL BEFORE 4:00 PM  At the [facility], you and your health needs are our priority. As part of our continuing mission to provide you with exceptional heart care, we have created designated Provider Care Teams. These Care Teams include your primary Cardiologist (physician) and Advanced Practice Providers (APPs- Physician Assistants and Nurse Practitioners) who all work together to provide you with the care you need, when you need it.   You may see any of the following providers on your designated Care Team at your next follow up: Dr Glori Bickers Dr Haynes Kerns, NP Lyda Jester, [place] [facility] [place], [place] Audry Riles, PharmD   Please be sure to bring in all your medications bottles to every appointment.

## 2022-06-12 DIAGNOSIS — R52 Pain, unspecified: Secondary | ICD-10-CM | POA: Diagnosis not present

## 2022-06-12 DIAGNOSIS — N186 End stage renal disease: Secondary | ICD-10-CM | POA: Diagnosis not present

## 2022-06-12 DIAGNOSIS — Z992 Dependence on renal dialysis: Secondary | ICD-10-CM | POA: Diagnosis not present

## 2022-06-12 DIAGNOSIS — N2581 Secondary hyperparathyroidism of renal origin: Secondary | ICD-10-CM | POA: Diagnosis not present

## 2022-06-12 DIAGNOSIS — I2510 Atherosclerotic heart disease of native coronary artery without angina pectoris: Secondary | ICD-10-CM | POA: Diagnosis not present

## 2022-06-12 DIAGNOSIS — D631 Anemia in chronic kidney disease: Secondary | ICD-10-CM | POA: Diagnosis not present

## 2022-06-12 DIAGNOSIS — D509 Iron deficiency anemia, unspecified: Secondary | ICD-10-CM | POA: Diagnosis not present

## 2022-06-12 NOTE — Progress Notes (Signed)
PCP: [name], MD Cardiology: Dr. Radford Pax EP: Dr. Rayann Heman HF cardiology: Dr. Aundra Dubin  61 y.o. with history of CAD, ischemic CMP, and ESRD was referred by Dr. Radford Pax for evaluation of CHF.  He had PCI in 0240, uncertain what vessel was involved.  He has not had coronary angiography since that time due to advanced CKD.  He has had ischemic cardiomyopathy x years.  Echo in 2/22 showed EF 25-30% with moderate LV dilation and normal RV.  He had VT arrest in 2015, has [employer] subcutaneous ICD.  [name] in 2/22.  LHC/RHC was done in 8/22, showing no significant CAD and stable hemodynamics.   Echo 11/22 EF 25-30%, moderate LV dilation, diffuse hypokinesis, normal RV.   Patient had ICD shock in 6/23 while at HD => VT storm.  He was noted to be volume overloaded.  He was started on amiodarone.    Today he returns for HF follow up.  No further VT episodes.  Weight down 8 lbs.  He says that they have lowered his dry weight at HD.  No dyspnea walking on flat ground, he does get short of breath walking up stairs.  No chest pain.    ECG (personally reviewed): NSR with PVC, IVCD 150 msec  Labs (6/22): LDL 75 Labs (8/22): hgb 13.9  PMH: 1. CAD: PCI in 9735, uncertain what vessel.  - Cardiolite 2017: Inferior and inferolateral large fixed defect suggestive of prior infarction, EF 28%.  - LHC (8/22): No significant CAD.  2. Chronic systolic CHF: Ischemic cardiomyopathy. St Jude subcutaneous ICD.  - Echo (7/20) with EF 25-30%.  - Echo (10/20) with EF 40-45% - Echo (2/22): EF 25-30%, moderate LV dilation, mild LVH, normal RV size and systolic function, mild MR.  - LHC/RHC (8/22): mean RA 2, PA 24/3, mean PCWP 9, CI 3.13; no significant coronary disease.  - Echo (11/22): EF 25-30%, moderate LV dilation, diffuse hypokinesis, normal RV. 3. ESRD since 2/22 4. OSA on CPAP 5. H/o VT arrest: Subcutaneous St Jude ICD placed in 2015 at [facility]  - VT storm 6/23, amiodarone started.  6. COVID-19  1/22.  7. HTN 8. Hyperlipidemia 9. Recurrent angioedema.   Social History   Socioeconomic History   Marital status: Single    Spouse name: Not on file   Number of children: 3   Years of education: Not on file   Highest education level: Not on file  Occupational History   Occupation: retired  Tobacco Use   Smoking status: Never   Smokeless tobacco: Never  Vaping Use   Vaping Use: Never used  Substance and Sexual Activity   Alcohol use: No   Drug use: No   Sexual activity: Yes  Other Topics Concern   Not on file  Social History Narrative   ** Merged History Encounter **       Social Determinants of Health   Financial Resource Strain: Not on file  Food Insecurity: Not on file  Transportation Needs: Not on file  Physical Activity: Not on file  Stress: Not on file  Social Connections: Not on file  Intimate Partner Violence: Not on file   Family History  Problem Relation Age of Onset   Hypertension Mother    Liver disease Father    ROS: All systems reviewed and negative except as per HPI.   Current Outpatient Medications  Medication Sig Dispense Refill   albuterol (PROVENTIL) (2.5 MG/3ML) 0.083% nebulizer solution TAKE 3 MLS (2.5 MG TOTAL)  BY NEBULIZATION EVERY 6 (SIX) HOURS AS NEEDED FOR WHEEZING OR SHORTNESS OF BREATH. 75 mL 12   albuterol (VENTOLIN HFA) 108 (90 Base) MCG/ACT inhaler Inhale 2 puffs into the lungs every 4 (four) hours as needed for wheezing or shortness of breath. 8 g 6   amiodarone (PACERONE) 200 MG tablet Take 2 tablets (400 mg total) by mouth 2 (two) times daily. Take Tabs twice a day until instructed otherwise 120 tablet 1   aspirin EC 81 MG tablet Take 1 tablet (81 mg total) by mouth daily. 30 tablet 5   atorvastatin (LIPITOR) 40 MG tablet TAKE 1 TABLET (40 MG TOTAL) BY MOUTH AT BEDTIME. 30 tablet 2   AURYXIA 1 GM 210 MG(Fe) tablet Take 1 tablet (210 mg total) by mouth 3 (three) times daily. 270 tablet 1   calcium acetate (PHOSLO) 667 MG capsule  Take 2 capsule by mouth three times a day with meals 180 capsule 6   cetirizine (ZYRTEC) 10 MG tablet Take 1 tablet (10 mg total) by mouth 2 (two) times daily. 60 tablet 5   Cholecalciferol (VITAMIN D-3) 125 MCG (5000 UT) TABS Take 5,000 Units by mouth daily.     docusate sodium (COLACE) 100 MG capsule Take 1 capsule (100 mg total) by mouth 2 (two) times daily. 60 capsule 4   EPINEPHrine 0.3 mg/0.3 mL IJ SOAJ injection Inject 0.3 mg into the muscle once as needed for up to 2 doses (if worsening tongue swelling, SOB, hypoxia, or other concerns for progressive anaphylaxis). 2 each 2   ethyl chloride spray SPRAY A SMALL AMOUNT THREE TIMES A WEEK JUST PRIOR TO NEEDLE INSERTION 116 mL 11   fluticasone (FLONASE) 50 MCG/ACT nasal spray Place into both nostrils as needed for allergies or rhinitis.     hydrALAZINE (APRESOLINE) 25 MG tablet Take 3 tablets (75 mg total) by mouth 3 (three) times daily. 270 tablet 11   isosorbide mononitrate (IMDUR) 60 MG 24 hr tablet Take 60 mg by mouth daily.     Methoxy PEG-Epoetin Beta (MIRCERA IJ)      polyethylene glycol (MIRALAX) 17 g packet Take 17 g by mouth daily. Titrate up as needed 14 each 0   Rectal Protectant-Emollient (CALMOL-4) 76-10 % SUPP Use as directed once to twice daily 6 suppository 0   SYMBICORT 80-4.5 MCG/ACT inhaler INHALE TWO PUFFS INTO THE LUNGS TWICE DAILY (BULK) 10.2 g 6   carvedilol (COREG) 6.25 MG tablet Take 0.5 tablets (3.125 mg total) by mouth 2 (two) times daily. 60 tablet 11   No current facility-administered medications for this encounter.   Wt Readings from Last 3 Encounters:  06/11/22 118.3 kg (260 lb 12.8 oz)  05/28/22 117.2 kg (258 lb 4.8 oz)  05/18/22 121.6 kg (268 lb 2 oz)   BP 120/78   Pulse 84   Wt 118.3 kg (260 lb 12.8 oz)   SpO2 95%   BMI 40.85 kg/m  General: NAD, obese.  Neck: No JVD, no thyromegaly or thyroid nodule.  Lungs: Clear to auscultation bilaterally with normal respiratory effort. CV: Nondisplaced PMI.   Heart regular S1/S2, no S3/S4, no murmur.  No peripheral edema.  No carotid bruit.  Normal pedal pulses.  Abdomen: Soft, nontender, no hepatosplenomegaly, no distention.  Skin: Intact without lesions or rashes.  Neurologic: Alert and oriented x 3.  Psych: Normal affect. Extremities: No clubbing or cyanosis.  HEENT: Normal.   Assessment/Plan: 1. CAD: PCI in 2005, unsure what vessel.  Coronary angiography in 8/22 showed no  significant CAD.   No chest pain. - Continue atorvastatin, check lipids today.  - Continue ASA 81 daily.  2. ESRD: Since 2/22.  Turned down for renal transplant due to low EF.  3. Chronic systolic CHF: Ischemic cardiomyopathy, echo in 2/22 with EF 25-30%, moderate LV dilation, mild LVH, normal RV size and systolic function, mild MR.   He has a [employer] subcutaneous ICD.  Echo t11/22 EF 25-30%, moderate LV dilation, normal RV.  He is not volume overloaded on exam, stable NYHA II symptoms.  Medication titration limited by ESRD, need to avoid hypotension at HD.  - Increase Coreg to 6.25 mg bid.   - Continue hydralazine 75 mg tid and Imdur 60 mg daily.   - ECG with IVCD 150 msec, thought not to be good candidate for CRT upgrade (not true LBBB).  4. OSA: Continue CPAP.  5. Angioedema: Recurrent episodes.  He is not on a med that commonly causes angioedema.  No recent episodes. 6. VT: VT storm episode in 6/23 and amiodarone started.  - Continue amiodarone 400 bid for a total of 21 days then 200 mg bid after that.  Check LFTs and TSH today, he will need a regular eye exam.   Followup in 3 months with APP.   Loralie Champagne FNP 06/12/2022

## 2022-06-14 ENCOUNTER — Ambulatory Visit (INDEPENDENT_AMBULATORY_CARE_PROVIDER_SITE_OTHER): Payer: Self-pay

## 2022-06-14 DIAGNOSIS — I5022 Chronic systolic (congestive) heart failure: Secondary | ICD-10-CM

## 2022-06-14 DIAGNOSIS — I255 Ischemic cardiomyopathy: Secondary | ICD-10-CM

## 2022-06-15 ENCOUNTER — Encounter: Payer: Medicare Other | Admitting: Physician Assistant

## 2022-06-15 DIAGNOSIS — D509 Iron deficiency anemia, unspecified: Secondary | ICD-10-CM | POA: Diagnosis not present

## 2022-06-15 DIAGNOSIS — Z992 Dependence on renal dialysis: Secondary | ICD-10-CM | POA: Diagnosis not present

## 2022-06-15 DIAGNOSIS — N2581 Secondary hyperparathyroidism of renal origin: Secondary | ICD-10-CM | POA: Diagnosis not present

## 2022-06-15 DIAGNOSIS — D631 Anemia in chronic kidney disease: Secondary | ICD-10-CM | POA: Diagnosis not present

## 2022-06-15 DIAGNOSIS — N186 End stage renal disease: Secondary | ICD-10-CM | POA: Diagnosis not present

## 2022-06-15 DIAGNOSIS — I2510 Atherosclerotic heart disease of native coronary artery without angina pectoris: Secondary | ICD-10-CM | POA: Diagnosis not present

## 2022-06-15 DIAGNOSIS — R52 Pain, unspecified: Secondary | ICD-10-CM | POA: Diagnosis not present

## 2022-06-17 ENCOUNTER — Ambulatory Visit: Payer: Medicare Other | Admitting: Internal Medicine

## 2022-06-17 DIAGNOSIS — Z992 Dependence on renal dialysis: Secondary | ICD-10-CM | POA: Diagnosis not present

## 2022-06-17 DIAGNOSIS — N186 End stage renal disease: Secondary | ICD-10-CM | POA: Diagnosis not present

## 2022-06-17 DIAGNOSIS — R52 Pain, unspecified: Secondary | ICD-10-CM | POA: Diagnosis not present

## 2022-06-17 DIAGNOSIS — I2510 Atherosclerotic heart disease of native coronary artery without angina pectoris: Secondary | ICD-10-CM | POA: Diagnosis not present

## 2022-06-17 DIAGNOSIS — N2581 Secondary hyperparathyroidism of renal origin: Secondary | ICD-10-CM | POA: Diagnosis not present

## 2022-06-17 DIAGNOSIS — D509 Iron deficiency anemia, unspecified: Secondary | ICD-10-CM | POA: Diagnosis not present

## 2022-06-17 DIAGNOSIS — D631 Anemia in chronic kidney disease: Secondary | ICD-10-CM | POA: Diagnosis not present

## 2022-06-18 ENCOUNTER — Encounter: Payer: Self-pay | Admitting: Internal Medicine

## 2022-06-18 ENCOUNTER — Ambulatory Visit: Payer: Medicare Other | Attending: Internal Medicine | Admitting: Internal Medicine

## 2022-06-18 ENCOUNTER — Other Ambulatory Visit: Payer: Self-pay

## 2022-06-18 VITALS — BP 124/80 | HR 63 | Wt 260.2 lb

## 2022-06-18 DIAGNOSIS — I10 Essential (primary) hypertension: Secondary | ICD-10-CM

## 2022-06-18 DIAGNOSIS — N186 End stage renal disease: Secondary | ICD-10-CM | POA: Diagnosis not present

## 2022-06-18 DIAGNOSIS — G4733 Obstructive sleep apnea (adult) (pediatric): Secondary | ICD-10-CM | POA: Diagnosis not present

## 2022-06-18 DIAGNOSIS — N2581 Secondary hyperparathyroidism of renal origin: Secondary | ICD-10-CM

## 2022-06-18 DIAGNOSIS — I472 Ventricular tachycardia, unspecified: Secondary | ICD-10-CM

## 2022-06-18 DIAGNOSIS — I5022 Chronic systolic (congestive) heart failure: Secondary | ICD-10-CM | POA: Diagnosis not present

## 2022-06-18 DIAGNOSIS — Z23 Encounter for immunization: Secondary | ICD-10-CM

## 2022-06-18 DIAGNOSIS — K649 Unspecified hemorrhoids: Secondary | ICD-10-CM

## 2022-06-18 DIAGNOSIS — I2510 Atherosclerotic heart disease of native coronary artery without angina pectoris: Secondary | ICD-10-CM

## 2022-06-18 DIAGNOSIS — J449 Chronic obstructive pulmonary disease, unspecified: Secondary | ICD-10-CM

## 2022-06-18 DIAGNOSIS — Z9989 Dependence on other enabling machines and devices: Secondary | ICD-10-CM

## 2022-06-18 DIAGNOSIS — Z992 Dependence on renal dialysis: Secondary | ICD-10-CM | POA: Diagnosis not present

## 2022-06-18 LAB — CUP PACEART REMOTE DEVICE CHECK
Battery Remaining Percentage: 74 %
Date Time Interrogation Session: 20230713194900
Implantable Lead Implant Date: 20150507
Implantable Lead Implant Date: 20150507
Implantable Lead Location: 753862
Implantable Lead Location: 753862
Implantable Lead Model: 3400
Implantable Lead Model: 3400
Implantable Pulse Generator Implant Date: 20211108
Pulse Gen Serial Number: 146303

## 2022-06-18 MED ORDER — ZOSTER VAC RECOMB ADJUVANTED 50 MCG/0.5ML IM SUSR
0.5000 mL | Freq: Once | INTRAMUSCULAR | 0 refills | Status: DC
  Filled 2022-06-18: qty 0.5, 1d supply, fill #0

## 2022-06-18 MED ORDER — ZOSTER VAC RECOMB ADJUVANTED 50 MCG/0.5ML IM SUSR: 0.5000 mL | Freq: Once | INTRAMUSCULAR | 0 refills | Status: AC

## 2022-06-18 MED ORDER — CALMOL-4 76-10 % RE SUPP: RECTAL | 2 refills | Status: DC

## 2022-06-18 NOTE — Progress Notes (Signed)
Patient ID: Victor Thompson, male    DOB: 08/06/1961  MRN: [id number]  CC: Chronic disease management  Subjective: Victor Thompson is a 61 y.o. male who presents for chronic disease management His concerns today include:  Pt with hx of ESRD on hemodialysis (Dr. Joelyn Oms), HTN, CAD (with hx of cardiac arrest), ICD, combined sys/dia CHF (EF 25-30 % 01/2021 and 10/2021), HL, OSA on CPAP, Obese, COPD, chronic lower back pain with multilevel spondylosis.   HTN/CHF/CAD: Since last visit with me, patient was hospitalized 05/28/2022 after his ICD fired during dialysis, thought to be VT storm.  He was started on amiodarone.  He has followed up with Dr. Aundra Dubin since then.  Carvedilol increased to 6.25 mg twice a day.  He was continued on hydralazine 75 mg 3 times daily and Imdur 60 mg daily.  Continue to take Lipitor 40 mg - reports compliance with meds -no CP or further firing of ICD since then -LFTs, Lipid profile and TSH ordered by Dr. Aundra Dubin.  Reports he gave lab slip to HD nurse and they drew it  OSA: Received his new CPAP from [employer].  However wakes up at nights with water in the hose -using his CPAP every night.  Wakes up feeling refresh and feels he is benefiting from use  Saw Dr. Havery Moros for hemorrhoids and rectal bleeding.  Recommended MiraLAX to use as stool softener and given sample of Rectal Protectant-Emollient.  However pt states he did not receive the latter.  He has been using Preparation H OTC which helps only slightly.   -no more rectal bleeding but pain from hemorrhoid when he has b/m.  BM have been soft.  Colonoscopy scheduled for 08/2022 for history of colon polyps  ESRD/secondary PTH: Compliant with going to dialysis 3 times a week.  COPD:  using Symbicort inhaler but not every day.  Using it PRN.  Using Albuterol QOD since recent hospitalization.  Used his neb 2 x since hosp  Patient Active Problem List   Diagnosis Date Noted   Chronic obstructive pulmonary disease, unspecified  COPD type ([place]) 06/18/2022   ICD (implantable cardioverter-defibrillator) discharge 05/25/2022   Seasonal and perennial allergic rhinitis 11/12/2021   Moderate persistent asthma, uncomplicated 93/71/6967   STD exposure 08/27/2021   ESRD on dialysis ([place]) 02/06/2021   Hypervolemia associated with renal insufficiency 01/12/2021   Angioedema 09/22/2020   Secondary hyperparathyroidism of renal origin ([place]) 07/15/2019   Vitamin D deficiency 07/15/2019   CAD (coronary artery disease), native coronary artery 04/10/2019   Chronic midline low back pain without sciatica 02/17/2018   Ventricular tachycardia ([place]) 03/29/2016   Defibrillator discharge    Chronic combined systolic and diastolic CHF (congestive heart failure) ([place]) 01/22/2016   Erectile dysfunction 01/22/2016   Allergic rhinitis 01/22/2016   GERD (gastroesophageal reflux disease) 01/22/2016   PROTEINURIA 02/17/2010   Cardiomyopathy, ischemic 02/16/2010   Obstructive sleep apnea 02/16/2010   EXTERNAL HEMORRHOIDS 09/26/2009   Tobacco use disorder in remission 06/03/2009   Essential hypertension 10/01/2008   Hyperlipidemia 07/14/2007   Class 3 severe obesity due to excess calories with serious comorbidity and body mass index (BMI) of 40.0 to 44.9 in adult [facility]) 07/14/2007   Asthma 07/14/2007     Current Outpatient Medications on File Prior to Visit  Medication Sig Dispense Refill   albuterol (PROVENTIL) (2.5 MG/3ML) 0.083% nebulizer solution TAKE 3 MLS (2.5 MG TOTAL) BY NEBULIZATION EVERY 6 (SIX) HOURS AS NEEDED FOR WHEEZING OR SHORTNESS OF BREATH. 75 mL 12  albuterol (VENTOLIN HFA) 108 (90 Base) MCG/ACT inhaler Inhale 2 puffs into the lungs every 4 (four) hours as needed for wheezing or shortness of breath. 8 g 6   amiodarone (PACERONE) 200 MG tablet Take 2 tablets (400 mg total) by mouth 2 (two) times daily. Take Tabs twice a day until instructed otherwise 120 tablet 1   aspirin EC 81 MG tablet Take 1 tablet (81 mg total) by mouth  daily. 30 tablet 5   atorvastatin (LIPITOR) 40 MG tablet TAKE 1 TABLET (40 MG TOTAL) BY MOUTH AT BEDTIME. 30 tablet 2   AURYXIA 1 GM 210 MG(Fe) tablet Take 1 tablet (210 mg total) by mouth 3 (three) times daily. 270 tablet 1   calcium acetate (PHOSLO) 667 MG capsule Take 2 capsule by mouth three times a day with meals 180 capsule 6   carvedilol (COREG) 6.25 MG tablet Take 0.5 tablets (3.125 mg total) by mouth 2 (two) times daily. 60 tablet 11   cetirizine (ZYRTEC) 10 MG tablet Take 1 tablet (10 mg total) by mouth 2 (two) times daily. 60 tablet 5   Cholecalciferol (VITAMIN D-3) 125 MCG (5000 UT) TABS Take 5,000 Units by mouth daily.     docusate sodium (COLACE) 100 MG capsule Take 1 capsule (100 mg total) by mouth 2 (two) times daily. 60 capsule 4   EPINEPHrine 0.3 mg/0.3 mL IJ SOAJ injection Inject 0.3 mg into the muscle once as needed for up to 2 doses (if worsening tongue swelling, SOB, hypoxia, or other concerns for progressive anaphylaxis). 2 each 2   ethyl chloride spray SPRAY A SMALL AMOUNT THREE TIMES A WEEK JUST PRIOR TO NEEDLE INSERTION 116 mL 11   fluticasone (FLONASE) 50 MCG/ACT nasal spray Place into both nostrils as needed for allergies or rhinitis.     hydrALAZINE (APRESOLINE) 25 MG tablet Take 3 tablets (75 mg total) by mouth 3 (three) times daily. 270 tablet 11   isosorbide mononitrate (IMDUR) 60 MG 24 hr tablet Take 60 mg by mouth daily.     Methoxy PEG-Epoetin Beta (MIRCERA IJ)      polyethylene glycol (MIRALAX) 17 g packet Take 17 g by mouth daily. Titrate up as needed 14 each 0   SYMBICORT 80-4.5 MCG/ACT inhaler INHALE TWO PUFFS INTO THE LUNGS TWICE DAILY (BULK) 10.2 g 6   No current facility-administered medications on file prior to visit.    Allergies  Allergen Reactions   Ace Inhibitors Swelling    Swelling of the tongue   Influenza Vaccines Hives and Swelling    SWELLING REACTION UNSPECIFIED    Ketorolac Swelling    SWELLING REACTION UNSPECIFIED    Lidocaine  Swelling    TONGUE SWELLS   Lisinopril Swelling    TONGUE SWELLING Pt reported problem with a BP med which sounded like  lisinopril  But as of 09/19/06,pt had tolerated altace without problem   Penicillins Swelling    TONGUE SWELLS Has patient had a PCN reaction causing immediate rash, facial/tongue/throat swelling, SOB or lightheadedness with hypotension: Yes Has patient had a PCN reaction causing severe rash involving mucus membranes or skin necrosis: No Has patient had a PCN reaction that required hospitalization No Has patient had a PCN reaction occurring within the last 10 years: Yes If all of the above answers are "NO", then may proceed with Cephalosporin use.     Social History   Socioeconomic History   Marital status: Single    Spouse name: Not on file   Number of children:  3   Years of education: Not on file   Highest education level: Not on file  Occupational History   Occupation: retired  Tobacco Use   Smoking status: Never   Smokeless tobacco: Never  Vaping Use   Vaping Use: Never used  Substance and Sexual Activity   Alcohol use: No   Drug use: No   Sexual activity: Yes  Other Topics Concern   Not on file  Social History Narrative   ** Merged History Encounter **       Social Determinants of Health   Financial Resource Strain: Not on file  Food Insecurity: Not on file  Transportation Needs: Not on file  Physical Activity: Not on file  Stress: Not on file  Social Connections: Not on file  Intimate Partner Violence: Not on file    Family History  Problem Relation Age of Onset   Hypertension Mother    Liver disease Father     Past Surgical History:  Procedure Laterality Date   AV FISTULA PLACEMENT Right 03/15/2019   Procedure: RIGHT ARM ARTERIOVENOUS (AV) FISTULA CREATION;  Surgeon: Angelia Mould, MD;  Location: [facility];  Service: Vascular;  Laterality: Right;   COLONOSCOPY     COLONOSCOPY W/ BIOPSIES AND POLYPECTOMY     CORONARY STENT  PLACEMENT  2007   IMPLANTABLE CARDIOVERTER DEFIBRILLATOR IMPLANT  2015   RIGHT/LEFT HEART CATH AND CORONARY ANGIOGRAPHY N/A 07/08/2021   Procedure: RIGHT/LEFT HEART CATH AND CORONARY ANGIOGRAPHY;  Surgeon: Larey Dresser, MD;  Location: [place] [facility];  Service: Cardiovascular;  Laterality: N/A;   SUBQ ICD CHANGEOUT N/A 10/13/2020   Procedure: SUBQ ICD CHANGEOUT;  Surgeon: Evans Lance, MD;  Location: [place] [facility];  Service: Cardiovascular;  Laterality: N/A;    ROS: Review of Systems Negative except as stated above  PHYSICAL EXAM: BP 124/80   Pulse 63   Wt 260 lb 3.2 oz (118 kg)   SpO2 95%   BMI 40.75 kg/m   Wt Readings from Last 3 Encounters:  06/18/22 260 lb 3.2 oz (118 kg)  06/11/22 260 lb 12.8 oz (118.3 kg)  05/28/22 258 lb 4.8 oz (117.2 kg)    Physical Exam  General appearance - alert, well appearing, obese older African-American male and in no distress Mental status - normal mood, behavior, speech, dress, motor activity, and thought processes Neck - supple, no significant adenopathy Chest - clear to auscultation, no wheezes, rales or rhonchi, symmetric air entry Heart -regular rate and rhythm Extremities -no lower extremity edema        Latest Ref Rng & Units 05/28/2022    9:35 AM 05/27/2022    4:05 AM 05/26/2022    3:43 AM  CMP  Glucose 70 - 99 mg/dL 116  105  120   BUN 6 - 20 mg/dL 38  26  28   Creatinine 0.61 - 1.24 mg/dL 8.27  8.35  8.91   Sodium 135 - 145 mmol/L 135  131  135   Potassium 3.5 - 5.1 mmol/L 3.9  4.0  5.4   Chloride 98 - 111 mmol/L 91  90  90   CO2 22 - 32 mmol/L '28  28  26   '$ Calcium 8.9 - 10.3 mg/dL 8.5  8.1  8.5    Lipid Panel     Component Value Date/Time   CHOL 142 05/11/2021 1122   TRIG 154 (H) 05/11/2021 1122   HDL 36 (L) 05/11/2021 1122   CHOLHDL 3.9 05/11/2021 1122  VLDL 31 05/11/2021 1122   LDLCALC 75 05/11/2021 1122    CBC    Component Value Date/Time   WBC 7.6 05/27/2022 0405   RBC 3.66 (L) 05/27/2022 0405    HGB 12.1 (L) 05/27/2022 0405   HGB 12.0 (L) 09/08/2021 1450   HCT 35.9 (L) 05/27/2022 0405   HCT 35.4 (L) 09/08/2021 1450   PLT 173 05/27/2022 0405   PLT 179 10/10/2020 0925   MCV 98.1 05/27/2022 0405   MCV 95 09/08/2021 1450   MCH 33.1 05/27/2022 0405   MCHC 33.7 05/27/2022 0405   RDW 15.5 05/27/2022 0405   RDW 14.2 09/08/2021 1450   LYMPHSABS 0.6 (L) 05/27/2022 0405   LYMPHSABS 0.8 09/08/2021 1450   MONOABS 1.0 05/27/2022 0405   EOSABS 0.0 05/27/2022 0405   EOSABS 0.3 09/08/2021 1450   BASOSABS 0.0 05/27/2022 0405   BASOSABS 0.0 09/08/2021 1450    ASSESSMENT AND PLAN: 1. Essential hypertension At goal.  Continue current medications including carvedilol, hydralazine 75 mg 3 times a day, Imdur 60 mg daily  2. Chronic systolic congestive heart failure, NYHA class 2 (HCC) Stable and compensated.  Continue Coreg, hydralazine and Imdur  3. CAD in native artery Continue aspirin, atorvastatin 40 mg daily, isosorbide 60 mg daily, carvedilol  4. Chronic obstructive pulmonary disease, unspecified COPD type ([place]) Recommend that he takes the Symbicort daily.  In doing so he will find that he may not have to use the albuterol inhaler as often.  5. VT (ventricular tachycardia) ([place]) With recent firing of ICD.  Now on amiodarone.  Followed by cardiology.  6. ESRD on dialysis [facility]) Patient will continue to go to dialysis 3 days a week.  Not a transplant candidate due to poor EF.  7. Secondary hyperparathyroidism of renal origin [facility]) Followed by nephrology.  On hemodialysis  8. OSA on CPAP Using and benefiting from his CPAP.  Advised that he call [facility] about the issue he is having with his CPAP machine for recommendations.  9. Hemorrhoids, unspecified hemorrhoid type Discussed the importance of keeping the bowel movements soft and regular. Recommend increasing fiber in the diet. - Rectal Protectant-Emollient (CALMOL-4) 76-10 % SUPP; Use as directed once to twice daily   Dispense: 6 suppository; Refill: 2  10. Need for shingles vaccine Discussed recommendation for shingles vaccine.  Advised that the vaccine can cause redness and swelling at the injection site that can last several days.  Also discussed rare side effect of Guillian barre syndrome.  Patient agreeable to receiving the vaccine.  Prescription given for him to get it at an outside pharmacy. - Zoster Vaccine Adjuvanted [facility]) injection; Inject 0.5 mLs into the muscle once for 1 dose.  Dispense: 0.5 mL; Refill: 0   Patient was given the opportunity to ask questions.  Patient verbalized understanding of the plan and was able to repeat key elements of the plan.   This documentation was completed using Radio producer.  Any transcriptional errors are unintentional.  No orders of the defined types were placed in this encounter.    Requested Prescriptions   Signed Prescriptions Disp Refills   Rectal Protectant-Emollient (CALMOL-4) 76-10 % SUPP 6 suppository 2    Sig: Use as directed once to twice daily   Zoster Vaccine Adjuvanted [facility]) injection 0.5 mL 0    Sig: Inject 0.5 mLs into the muscle once for 1 dose.    Return in about 4 months (around 10/19/2022).  Karle Plumber, MD, FACP

## 2022-06-18 NOTE — Patient Instructions (Signed)
You should take your CPAP machine and to [facility] for to be checked. I have sent the prescription to our pharmacy for the rectal suppository to use as needed for hemorrhoids.  Try doing warm sitdown baths twice a week to help with hemorrhoids. Use your Symbicort inhaler every day as prescribed. Please ask your dialysis center to send me the results of the recent blood test that were drawn for the cardiologist.

## 2022-06-19 DIAGNOSIS — I2510 Atherosclerotic heart disease of native coronary artery without angina pectoris: Secondary | ICD-10-CM | POA: Diagnosis not present

## 2022-06-19 DIAGNOSIS — Z992 Dependence on renal dialysis: Secondary | ICD-10-CM | POA: Diagnosis not present

## 2022-06-19 DIAGNOSIS — N186 End stage renal disease: Secondary | ICD-10-CM | POA: Diagnosis not present

## 2022-06-19 DIAGNOSIS — D509 Iron deficiency anemia, unspecified: Secondary | ICD-10-CM | POA: Diagnosis not present

## 2022-06-19 DIAGNOSIS — D631 Anemia in chronic kidney disease: Secondary | ICD-10-CM | POA: Diagnosis not present

## 2022-06-19 DIAGNOSIS — R52 Pain, unspecified: Secondary | ICD-10-CM | POA: Diagnosis not present

## 2022-06-19 DIAGNOSIS — N2581 Secondary hyperparathyroidism of renal origin: Secondary | ICD-10-CM | POA: Diagnosis not present

## 2022-06-22 DIAGNOSIS — D631 Anemia in chronic kidney disease: Secondary | ICD-10-CM | POA: Diagnosis not present

## 2022-06-22 DIAGNOSIS — Z992 Dependence on renal dialysis: Secondary | ICD-10-CM | POA: Diagnosis not present

## 2022-06-22 DIAGNOSIS — R52 Pain, unspecified: Secondary | ICD-10-CM | POA: Diagnosis not present

## 2022-06-22 DIAGNOSIS — N2581 Secondary hyperparathyroidism of renal origin: Secondary | ICD-10-CM | POA: Diagnosis not present

## 2022-06-22 DIAGNOSIS — D509 Iron deficiency anemia, unspecified: Secondary | ICD-10-CM | POA: Diagnosis not present

## 2022-06-22 DIAGNOSIS — N186 End stage renal disease: Secondary | ICD-10-CM | POA: Diagnosis not present

## 2022-06-22 DIAGNOSIS — I2510 Atherosclerotic heart disease of native coronary artery without angina pectoris: Secondary | ICD-10-CM | POA: Diagnosis not present

## 2022-06-23 ENCOUNTER — Encounter: Payer: Self-pay | Admitting: Internal Medicine

## 2022-06-23 ENCOUNTER — Encounter: Payer: Self-pay | Admitting: Student

## 2022-06-23 ENCOUNTER — Other Ambulatory Visit: Payer: Self-pay

## 2022-06-23 ENCOUNTER — Ambulatory Visit (INDEPENDENT_AMBULATORY_CARE_PROVIDER_SITE_OTHER): Payer: Medicare Other | Admitting: Student

## 2022-06-23 VITALS — BP 110/76 | HR 68 | Resp 16 | Ht 67.0 in | Wt 261.8 lb

## 2022-06-23 DIAGNOSIS — I5042 Chronic combined systolic (congestive) and diastolic (congestive) heart failure: Secondary | ICD-10-CM

## 2022-06-23 DIAGNOSIS — G4733 Obstructive sleep apnea (adult) (pediatric): Secondary | ICD-10-CM | POA: Diagnosis not present

## 2022-06-23 DIAGNOSIS — I472 Ventricular tachycardia, unspecified: Secondary | ICD-10-CM

## 2022-06-23 LAB — CUP PACEART INCLINIC DEVICE CHECK
Date Time Interrogation Session: 20230719102518
Implantable Lead Implant Date: 20150507
Implantable Lead Implant Date: 20150507
Implantable Lead Location: 753862
Implantable Lead Location: 753862
Implantable Lead Model: 3400
Implantable Lead Model: 3400
Implantable Pulse Generator Implant Date: 20211108
Pulse Gen Serial Number: 146303

## 2022-06-23 MED ORDER — AMIODARONE HCL 200 MG PO TABS
200.0000 mg | ORAL_TABLET | Freq: Two times a day (BID) | ORAL | 3 refills | Status: DC
  Filled 2022-06-23: qty 180, 90d supply, fill #0

## 2022-06-23 NOTE — Progress Notes (Signed)
Electrophysiology Office Note Date: 06/23/2022  ID:  Victor Victor, DOB 06-18-1961, MRN [id number]  PCP: [name], MD Primary Cardiologist: None Electrophysiologist: Dr. Rayann Heman -> Dr. Lovena Le  CC: Routine ICD follow-up  Victor Victor is a 61 y.o. male seen today for Victor Grayer, MD for post hospital follow up  Admitted 6/20 - 05/28/2022 for ICD shock and treated with IV amiodarone load for VT storm.   Since discharge, he has been doing very well. Doing HD T/Th/Saturday. Getting around and doing working in home remodeling without issue. No further ICD shocks. Denies undue dyspnea, chest pain, or syncope.   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   Past Medical History:  Diagnosis Date   Acute on chronic systolic CHF (congestive heart failure) ([place]) 06/14/2020   Acute respiratory failure ([place]) 05/28/2022   AICD (automatic cardioverter/defibrillator) present    2015   Asthma    no meds   Cardiac arrest ([place]) 2015   Chest pain 05/25/2022   CHF (congestive heart failure) ([facility])    Chronic kidney disease    ckd -stage 5   Coronary artery disease    Hypertension    Myocardial infarction ([place])    Obesity    Pneumonia    Pulmonary edema    Pulmonary edema 05/28/2022   Shortness of breath dyspnea    Sleep apnea    USES CPAP   Wears glasses    Past Surgical History:  Procedure Laterality Date   AV FISTULA PLACEMENT Right 03/15/2019   Procedure: RIGHT ARM ARTERIOVENOUS (AV) FISTULA CREATION;  Surgeon: Angelia Mould, MD;  Location: [place];  Service: Vascular;  Laterality: Right;   COLONOSCOPY     COLONOSCOPY W/ BIOPSIES AND POLYPECTOMY     CORONARY STENT PLACEMENT  2007   IMPLANTABLE CARDIOVERTER DEFIBRILLATOR IMPLANT  2015   RIGHT/LEFT HEART CATH AND CORONARY ANGIOGRAPHY N/A 07/08/2021   Procedure: RIGHT/LEFT HEART CATH AND CORONARY ANGIOGRAPHY;  Surgeon: Larey Dresser, MD;  Location: [facility];  Service: Cardiovascular;  Laterality: N/A;   SUBQ ICD CHANGEOUT N/A 10/13/2020   Procedure: SUBQ ICD CHANGEOUT;  Surgeon: Evans Lance, MD;  Location: [facility];  Service: Cardiovascular;  Laterality: N/A;    Current Outpatient Medications  Medication Sig Dispense Refill   albuterol (PROVENTIL) (2.5 MG/3ML) 0.083% nebulizer solution TAKE 3 MLS (2.5 MG TOTAL) BY NEBULIZATION EVERY 6 (SIX) HOURS AS NEEDED FOR WHEEZING OR SHORTNESS OF BREATH. 75 mL 12   albuterol (VENTOLIN HFA) 108 (90 Base) MCG/ACT inhaler Inhale 2 puffs into the lungs every 4 (four) hours as needed for wheezing or shortness of breath. 8 g 6   amiodarone (PACERONE) 200 MG tablet Take 2 tablets (400 mg total) by mouth 2 (two) times daily. Take Tabs twice a day until instructed otherwise 120 tablet 1   aspirin EC 81 MG tablet Take 1 tablet (81 mg total) by mouth daily. 30 tablet 5   atorvastatin (LIPITOR) 40 MG tablet TAKE 1 TABLET (40 MG TOTAL) BY MOUTH AT BEDTIME. 30 tablet 2   AURYXIA 1 GM 210 MG(Fe) tablet Take 1 tablet (210 mg total) by mouth 3 (three) times daily. 270 tablet 1   calcium acetate (PHOSLO) 667 MG capsule Take 2 capsule by mouth three times a day with meals 180 capsule 6   carvedilol (COREG) 6.25 MG tablet Take 0.5 tablets (3.125 mg total) by mouth 2 (two) times  daily. 60 tablet 11   cetirizine (ZYRTEC) 10 MG tablet Take 1 tablet (10 mg total) by mouth 2 (two) times daily. 60 tablet 5   Cholecalciferol (VITAMIN D-3) 125 MCG (5000 UT) TABS Take 5,000 Units by mouth daily.     docusate sodium (COLACE) 100 MG capsule Take 1 capsule (100 mg total) by mouth 2 (two) times daily. 60 capsule 4   EPINEPHrine 0.3 mg/0.3 mL IJ SOAJ injection Inject 0.3 mg into the muscle once as needed for up to 2 doses (if worsening tongue swelling, SOB, hypoxia, or other concerns for progressive anaphylaxis). 2 each 2   ethyl chloride spray SPRAY A SMALL AMOUNT THREE TIMES A WEEK JUST PRIOR TO NEEDLE INSERTION 116 mL 11    fluticasone (FLONASE) 50 MCG/ACT nasal spray Place into both nostrils as needed for allergies or rhinitis.     hydrALAZINE (APRESOLINE) 25 MG tablet Take 3 tablets (75 mg total) by mouth 3 (three) times daily. 270 tablet 11   isosorbide mononitrate (IMDUR) 60 MG 24 hr tablet Take 60 mg by mouth daily.     Methoxy PEG-Epoetin Beta (MIRCERA IJ)      polyethylene glycol (MIRALAX) 17 g packet Take 17 g by mouth daily. Titrate up as needed 14 each 0   Rectal Protectant-Emollient (CALMOL-4) 76-10 % SUPP Use as directed once to twice daily 6 suppository 2   SYMBICORT 80-4.5 MCG/ACT inhaler INHALE TWO PUFFS INTO THE LUNGS TWICE DAILY (BULK) 10.2 g 6   No current facility-administered medications for this visit.    Allergies:   Ace inhibitors, Influenza vaccines, Ketorolac, Lidocaine, Lisinopril, and Penicillins   Social History: Social History   Socioeconomic History   Marital status: Single    Spouse name: Not on file   Number of children: 3   Years of education: Not on file   Highest education level: Not on file  Occupational History   Occupation: retired  Tobacco Use   Smoking status: Never   Smokeless tobacco: Never  Vaping Use   Vaping Use: Never used  Substance and Sexual Activity   Alcohol use: No   Drug use: No   Sexual activity: Yes  Other Topics Concern   Not on file  Social History Narrative   ** Merged History Encounter **       Social Determinants of Health   Financial Resource Strain: Not on file  Food Insecurity: Not on file  Transportation Needs: Not on file  Physical Activity: Not on file  Stress: Not on file  Social Connections: Not on file  Intimate Partner Violence: Not on file    Family History: Family History  Problem Relation Age of Onset   Hypertension Mother    Liver disease Father     Review of Systems: All other systems reviewed and are otherwise negative except as noted above.   Physical Exam: Vitals:   06/23/22 1005  BP: 110/76   Pulse: 68  Resp: 16  SpO2: 96%  Weight: 261 lb 12.8 oz (118.8 kg)  Height: '5\' 7"'$  (1.702 m)     GEN- The patient is well appearing, alert and oriented x 3 today.   HEENT: normocephalic, atraumatic; sclera clear, conjunctiva pink; hearing intact; oropharynx clear; neck supple, no JVP Lymph- no cervical lymphadenopathy Lungs- Clear to ausculation bilaterally, normal work of breathing.  No wheezes, rales, rhonchi Heart- Regular rate and rhythm, no murmurs, rubs or gallops, PMI not laterally displaced GI- soft, non-tender, non-distended, bowel sounds present, no hepatosplenomegaly Extremities- no  clubbing or cyanosis. No edema; DP/PT/radial pulses 2+ bilaterally MS- no significant deformity or atrophy Skin- warm and dry, no rash or lesion; ICD pocket well healed Psych- euthymic mood, full affect Neuro- strength and sensation are intact  ICD interrogation- reviewed in detail today,  See PACEART report  EKG:  EKG is not ordered today. Personal review of EKG ordered  02/16/2022  shows NSR at 89 bpm with PVCs  Recent Labs: 05/25/2022: ALT 18; B Natriuretic Peptide 1,355.4 05/26/2022: Magnesium 2.0 05/27/2022: Hemoglobin 12.1; Platelets 173 05/28/2022: BUN 38; Creatinine, Ser 8.27; Potassium 3.9; Sodium 135   Wt Readings from Last 3 Encounters:  06/23/22 261 lb 12.8 oz (118.8 kg)  06/18/22 260 lb 3.2 oz (118 kg)  06/11/22 260 lb 12.8 oz (118.3 kg)     Other studies Reviewed: Additional studies/ records that were reviewed today include: Previous EP office notes.   Assessment and Plan:  1.  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 Volume per HD.  Stable on an appropriate medical regimen Normal S-ICD function See Pace Art report No changes today  2. Prior VF arrest 3. VT No further since discharge Normal ICD function   4. ESRD on HD T/Th/Saturday.  Will have labs drawn there.    4. "AF" episodes on S-ICD Most consistent with ectopy and  overall low burden.  Continue to monitor through device. If burden increases, may need to consider monitor for clarity.   Current medicines are reviewed at length with the patient today.    Disposition:   Follow up with Dr Lovena Le in 3 months to establish from Dr. Rayann Heman.   Jacalyn Lefevre, PA-C  06/23/2022 10:17 AM  [facility] [address] [place] [address] [phone] (office) [phone] (fax)

## 2022-06-23 NOTE — Progress Notes (Signed)
I received the labs drawn during dialysis on this patient at [facility] on 06/15/2022. H/H 12.1/36.3 BUN/creatinine 67/12.8, phosphorus 8.9, PTH 407 AST 10 Cholesterol 207, triglyceride 208, LDL 134 TSH 4.6

## 2022-06-23 NOTE — Patient Instructions (Signed)
Medication Instructions:  Your physician has recommended you make the following change in your medication:   DECREASE: Amiodarone to '200mg'$  twice daily  *If you need a refill on your cardiac medications before your next appointment, please call your pharmacy*   Lab Work: CMET, TSH, FreeT4 - have this done at Dialysis  If you have labs (blood work) drawn today and your tests are completely normal, you will receive your results only by: [place] (if you have MyChart) OR A paper copy in the mail If you have any lab test that is abnormal or we need to change your treatment, we will call you to review the results.   Follow-Up: At [facility], you and your health needs are our priority.  As part of our continuing mission to provide you with exceptional heart care, we have created designated Provider Care Teams.  These Care Teams include your primary Cardiologist (physician) and Advanced Practice Providers (APPs -  Physician Assistants and Nurse Practitioners) who all work together to provide you with the care you need, when you need it.   Your next appointment:   3 month(s)  The format for your next appointment:   In Person  Provider:   Cristopher Peru, MD{

## 2022-06-24 DIAGNOSIS — Z992 Dependence on renal dialysis: Secondary | ICD-10-CM | POA: Diagnosis not present

## 2022-06-24 DIAGNOSIS — N186 End stage renal disease: Secondary | ICD-10-CM | POA: Diagnosis not present

## 2022-06-24 DIAGNOSIS — N2581 Secondary hyperparathyroidism of renal origin: Secondary | ICD-10-CM | POA: Diagnosis not present

## 2022-06-24 DIAGNOSIS — R52 Pain, unspecified: Secondary | ICD-10-CM | POA: Diagnosis not present

## 2022-06-24 DIAGNOSIS — D509 Iron deficiency anemia, unspecified: Secondary | ICD-10-CM | POA: Diagnosis not present

## 2022-06-24 DIAGNOSIS — I2510 Atherosclerotic heart disease of native coronary artery without angina pectoris: Secondary | ICD-10-CM | POA: Diagnosis not present

## 2022-06-24 DIAGNOSIS — D631 Anemia in chronic kidney disease: Secondary | ICD-10-CM | POA: Diagnosis not present

## 2022-06-25 ENCOUNTER — Other Ambulatory Visit: Payer: Self-pay

## 2022-06-25 ENCOUNTER — Telehealth (HOSPITAL_COMMUNITY): Payer: Self-pay | Admitting: Cardiology

## 2022-06-25 DIAGNOSIS — E785 Hyperlipidemia, unspecified: Secondary | ICD-10-CM

## 2022-06-25 DIAGNOSIS — I2510 Atherosclerotic heart disease of native coronary artery without angina pectoris: Secondary | ICD-10-CM

## 2022-06-25 MED ORDER — ATORVASTATIN CALCIUM 80 MG PO TABS
80.0000 mg | ORAL_TABLET | Freq: Every day | ORAL | 3 refills | Status: DC
  Filled 2022-06-25: qty 90, 90d supply, fill #0
  Filled 2022-09-13: qty 90, 90d supply, fill #1

## 2022-06-25 NOTE — Telephone Encounter (Signed)
3 minutes ago (10:58 AM)    Pt aware and voiced understanding Meds updated Lab appt mailed to patient       Note        Larey Dresser, MD  Shirley Friar, PA-C; [facility] Triage Pool; Rose Fillers, [place] Dolly, RN 16 hours ago (6:46 PM)    Increase atorvastatin to 80 mg daily, check lipids/LFTs in 2 months.

## 2022-06-25 NOTE — Progress Notes (Signed)
Pt aware and voiced understanding Meds updated Lab appt mailed to patient

## 2022-06-26 DIAGNOSIS — Z992 Dependence on renal dialysis: Secondary | ICD-10-CM | POA: Diagnosis not present

## 2022-06-26 DIAGNOSIS — N2581 Secondary hyperparathyroidism of renal origin: Secondary | ICD-10-CM | POA: Diagnosis not present

## 2022-06-26 DIAGNOSIS — D631 Anemia in chronic kidney disease: Secondary | ICD-10-CM | POA: Diagnosis not present

## 2022-06-26 DIAGNOSIS — I2510 Atherosclerotic heart disease of native coronary artery without angina pectoris: Secondary | ICD-10-CM | POA: Diagnosis not present

## 2022-06-26 DIAGNOSIS — N186 End stage renal disease: Secondary | ICD-10-CM | POA: Diagnosis not present

## 2022-06-26 DIAGNOSIS — D509 Iron deficiency anemia, unspecified: Secondary | ICD-10-CM | POA: Diagnosis not present

## 2022-06-26 DIAGNOSIS — R52 Pain, unspecified: Secondary | ICD-10-CM | POA: Diagnosis not present

## 2022-06-29 DIAGNOSIS — N186 End stage renal disease: Secondary | ICD-10-CM | POA: Diagnosis not present

## 2022-06-29 DIAGNOSIS — D509 Iron deficiency anemia, unspecified: Secondary | ICD-10-CM | POA: Diagnosis not present

## 2022-06-29 DIAGNOSIS — R52 Pain, unspecified: Secondary | ICD-10-CM | POA: Diagnosis not present

## 2022-06-29 DIAGNOSIS — Z992 Dependence on renal dialysis: Secondary | ICD-10-CM | POA: Diagnosis not present

## 2022-06-29 DIAGNOSIS — D631 Anemia in chronic kidney disease: Secondary | ICD-10-CM | POA: Diagnosis not present

## 2022-06-29 DIAGNOSIS — I2510 Atherosclerotic heart disease of native coronary artery without angina pectoris: Secondary | ICD-10-CM | POA: Diagnosis not present

## 2022-06-29 DIAGNOSIS — N2581 Secondary hyperparathyroidism of renal origin: Secondary | ICD-10-CM | POA: Diagnosis not present

## 2022-07-01 ENCOUNTER — Other Ambulatory Visit: Payer: Self-pay

## 2022-07-01 DIAGNOSIS — N186 End stage renal disease: Secondary | ICD-10-CM | POA: Diagnosis not present

## 2022-07-01 DIAGNOSIS — R52 Pain, unspecified: Secondary | ICD-10-CM | POA: Diagnosis not present

## 2022-07-01 DIAGNOSIS — I2510 Atherosclerotic heart disease of native coronary artery without angina pectoris: Secondary | ICD-10-CM | POA: Diagnosis not present

## 2022-07-01 DIAGNOSIS — N2581 Secondary hyperparathyroidism of renal origin: Secondary | ICD-10-CM | POA: Diagnosis not present

## 2022-07-01 DIAGNOSIS — D631 Anemia in chronic kidney disease: Secondary | ICD-10-CM | POA: Diagnosis not present

## 2022-07-01 DIAGNOSIS — D509 Iron deficiency anemia, unspecified: Secondary | ICD-10-CM | POA: Diagnosis not present

## 2022-07-01 DIAGNOSIS — Z992 Dependence on renal dialysis: Secondary | ICD-10-CM | POA: Diagnosis not present

## 2022-07-03 DIAGNOSIS — Z992 Dependence on renal dialysis: Secondary | ICD-10-CM | POA: Diagnosis not present

## 2022-07-03 DIAGNOSIS — D631 Anemia in chronic kidney disease: Secondary | ICD-10-CM | POA: Diagnosis not present

## 2022-07-03 DIAGNOSIS — N2581 Secondary hyperparathyroidism of renal origin: Secondary | ICD-10-CM | POA: Diagnosis not present

## 2022-07-03 DIAGNOSIS — R52 Pain, unspecified: Secondary | ICD-10-CM | POA: Diagnosis not present

## 2022-07-03 DIAGNOSIS — D509 Iron deficiency anemia, unspecified: Secondary | ICD-10-CM | POA: Diagnosis not present

## 2022-07-03 DIAGNOSIS — N186 End stage renal disease: Secondary | ICD-10-CM | POA: Diagnosis not present

## 2022-07-03 DIAGNOSIS — I2510 Atherosclerotic heart disease of native coronary artery without angina pectoris: Secondary | ICD-10-CM | POA: Diagnosis not present

## 2022-07-05 DIAGNOSIS — I129 Hypertensive chronic kidney disease with stage 1 through stage 4 chronic kidney disease, or unspecified chronic kidney disease: Secondary | ICD-10-CM | POA: Diagnosis not present

## 2022-07-05 DIAGNOSIS — N186 End stage renal disease: Secondary | ICD-10-CM | POA: Diagnosis not present

## 2022-07-05 DIAGNOSIS — Z992 Dependence on renal dialysis: Secondary | ICD-10-CM | POA: Diagnosis not present

## 2022-07-06 DIAGNOSIS — Z23 Encounter for immunization: Secondary | ICD-10-CM | POA: Diagnosis not present

## 2022-07-06 DIAGNOSIS — D509 Iron deficiency anemia, unspecified: Secondary | ICD-10-CM | POA: Diagnosis not present

## 2022-07-06 DIAGNOSIS — N2581 Secondary hyperparathyroidism of renal origin: Secondary | ICD-10-CM | POA: Diagnosis not present

## 2022-07-06 DIAGNOSIS — N186 End stage renal disease: Secondary | ICD-10-CM | POA: Diagnosis not present

## 2022-07-06 DIAGNOSIS — D631 Anemia in chronic kidney disease: Secondary | ICD-10-CM | POA: Diagnosis not present

## 2022-07-06 DIAGNOSIS — Z992 Dependence on renal dialysis: Secondary | ICD-10-CM | POA: Diagnosis not present

## 2022-07-08 ENCOUNTER — Telehealth: Payer: Self-pay

## 2022-07-08 ENCOUNTER — Telehealth: Payer: Self-pay | Admitting: Gastroenterology

## 2022-07-08 DIAGNOSIS — N2581 Secondary hyperparathyroidism of renal origin: Secondary | ICD-10-CM | POA: Diagnosis not present

## 2022-07-08 DIAGNOSIS — Z992 Dependence on renal dialysis: Secondary | ICD-10-CM | POA: Diagnosis not present

## 2022-07-08 DIAGNOSIS — D509 Iron deficiency anemia, unspecified: Secondary | ICD-10-CM | POA: Diagnosis not present

## 2022-07-08 DIAGNOSIS — D631 Anemia in chronic kidney disease: Secondary | ICD-10-CM | POA: Diagnosis not present

## 2022-07-08 DIAGNOSIS — Z23 Encounter for immunization: Secondary | ICD-10-CM | POA: Diagnosis not present

## 2022-07-08 DIAGNOSIS — N186 End stage renal disease: Secondary | ICD-10-CM | POA: Diagnosis not present

## 2022-07-08 NOTE — Telephone Encounter (Signed)
[facility] Pre-operative Risk Assessment     Request for surgical clearance:     Endoscopy Procedure  What type of surgery is being performed?     Colonoscopy    When is this surgery scheduled?     08/23/22  What type of clearance is required ?   Medical Clearance  Practice name and name of physician performing surgery?      [facility]  What is your office phone and fax number?      Phone- [phone]  [phone]  Anesthesia type (None, local, MAC, general) ?       MAC

## 2022-07-08 NOTE — Telephone Encounter (Signed)
Labs arrived from patient's nephrologist.  Lab from July 26 -hemoglobin 12.1  Lab from July 13 -hemoglobin 12.1, MCV 100 Ferritin of 756, iron level of 71, TIBC 206, iron sat 34%  No evidence of iron deficiency     [place] I otherwise noted that he was admitted to the hospital with some cardiac issues back in June.  Sounds like he is doing better based on review of recent cardiology note, however I do think he will need cardiac clearance to have anesthesia for his colonoscopy in September.  Can you please reach out to his cardiologist to see if he needs to see them in advance or can they provide clearance based off their recent visit with him?  Thanks

## 2022-07-08 NOTE — Progress Notes (Signed)
Remote reviewed. Battery status noted.  Leads function stable

## 2022-07-08 NOTE — Telephone Encounter (Signed)
Called and spoke with patient regarding his lab results. Pt is aware that we are reaching out to his cardiologist for medical clearance prior to his colonoscopy on 08/23/22. Pt is aware that we will contact him with more information once we receive a response from cardiology. Pt verbalized understanding and had no concerns at the end of the call.  See alternate telephone encounter for cardiac clearance details.

## 2022-07-09 NOTE — Telephone Encounter (Signed)
Ok to proceed. 

## 2022-07-10 DIAGNOSIS — N186 End stage renal disease: Secondary | ICD-10-CM | POA: Diagnosis not present

## 2022-07-10 DIAGNOSIS — D509 Iron deficiency anemia, unspecified: Secondary | ICD-10-CM | POA: Diagnosis not present

## 2022-07-10 DIAGNOSIS — Z992 Dependence on renal dialysis: Secondary | ICD-10-CM | POA: Diagnosis not present

## 2022-07-10 DIAGNOSIS — Z23 Encounter for immunization: Secondary | ICD-10-CM | POA: Diagnosis not present

## 2022-07-10 DIAGNOSIS — N2581 Secondary hyperparathyroidism of renal origin: Secondary | ICD-10-CM | POA: Diagnosis not present

## 2022-07-10 DIAGNOSIS — D631 Anemia in chronic kidney disease: Secondary | ICD-10-CM | POA: Diagnosis not present

## 2022-07-12 NOTE — Telephone Encounter (Signed)
   Primary Cardiologist: Loralie Champagne, MD  Chart reviewed as part of pre-operative protocol coverage. Given past medical history and time since last visit, based on ACC/AHA guidelines, per Dr. Jeannette How would be at acceptable risk for the planned procedure without further cardiovascular testing.   I will route this recommendation to the requesting party via Epic fax function and remove from pre-op pool.  Please call with questions.  Emmaline Life, NP-C    07/12/2022, 10:56 AM [place] [address] Office [phone] Fax [phone]

## 2022-07-13 DIAGNOSIS — D509 Iron deficiency anemia, unspecified: Secondary | ICD-10-CM | POA: Diagnosis not present

## 2022-07-13 DIAGNOSIS — N2581 Secondary hyperparathyroidism of renal origin: Secondary | ICD-10-CM | POA: Diagnosis not present

## 2022-07-13 DIAGNOSIS — N186 End stage renal disease: Secondary | ICD-10-CM | POA: Diagnosis not present

## 2022-07-13 DIAGNOSIS — D631 Anemia in chronic kidney disease: Secondary | ICD-10-CM | POA: Diagnosis not present

## 2022-07-13 DIAGNOSIS — Z23 Encounter for immunization: Secondary | ICD-10-CM | POA: Diagnosis not present

## 2022-07-13 DIAGNOSIS — Z992 Dependence on renal dialysis: Secondary | ICD-10-CM | POA: Diagnosis not present

## 2022-07-15 ENCOUNTER — Ambulatory Visit: Payer: Self-pay

## 2022-07-15 DIAGNOSIS — D509 Iron deficiency anemia, unspecified: Secondary | ICD-10-CM | POA: Diagnosis not present

## 2022-07-15 DIAGNOSIS — N186 End stage renal disease: Secondary | ICD-10-CM | POA: Diagnosis not present

## 2022-07-15 DIAGNOSIS — Z992 Dependence on renal dialysis: Secondary | ICD-10-CM | POA: Diagnosis not present

## 2022-07-15 DIAGNOSIS — D631 Anemia in chronic kidney disease: Secondary | ICD-10-CM | POA: Diagnosis not present

## 2022-07-15 DIAGNOSIS — N2581 Secondary hyperparathyroidism of renal origin: Secondary | ICD-10-CM | POA: Diagnosis not present

## 2022-07-15 DIAGNOSIS — Z23 Encounter for immunization: Secondary | ICD-10-CM | POA: Diagnosis not present

## 2022-07-15 NOTE — Patient Instructions (Signed)
Visit Information  Thank you for taking time to visit with me today. Please don't hesitate to contact me if I can be of assistance to you.   Following are the goals we discussed today:   Goals Addressed             This Visit's Progress    COMPLETED: Care Coordination Activities       Care Coordination Interventions: SDoH screening performed - no acute challenges identified at this time Discussed the patient is currently living with his cousin and would like a place of his own. Patient has been on wait list with [employer] since 2020 and is unable to afford more than $500 per month Encouraged the patient to stay engaged with [employer] Fall risk assessment completed - patient is a low fall risk Confirmed the patient does not have difficulty affording the costs of medications Performed chart review to note patient participated in [place] on 02/15/22 Introduced care coordination program to the patient - patient would like to speak with a nurse case manager Referral placed to RN Case Manager [place]        Please call the care guide team at [phone] if you need to schedule an appointment with me  If you are experiencing a Mental Health or [place] or need someone to talk to, please call 1-800-273-TALK (toll free, 24 hour hotline)  Patient verbalizes understanding of instructions and care plan provided today and agrees to view in [place]. Active MyChart status and patient understanding of how to access instructions and care plan via MyChart confirmed with patient.     The nurse case manager will contact you.   Daneen Schick, BSW, CDP Social Worker, Certified Dementia Practitioner Care Coordination [phone]

## 2022-07-15 NOTE — Progress Notes (Signed)
Remote ICD transmission.   

## 2022-07-15 NOTE — Patient Outreach (Signed)
  Care Coordination   Initial Visit Note   07/15/2022 Name: IVEN EARNHART MRN: [id number] DOB: 04/27/1961  LORENZO ARSCOTT is a 61 y.o. year old male who sees [name], MD for primary care. I spoke with  [name] by phone today  What matters to the patients health and wellness today?  I would like to speak to the nurse    Goals Addressed             This Visit's Progress    COMPLETED: Care Coordination Activities       Care Coordination Interventions: SDoH screening performed - no acute challenges identified at this time Discussed the patient is currently living with his cousin and would like a place of his own. Patient has been on wait list with [employer] since 2020 and is unable to afford more than $500 per month Encouraged the patient to stay engaged with [employer] Fall risk assessment completed - patient is a low fall risk Confirmed the patient does not have difficulty affording the costs of medications Performed chart review to note patient participated in [place] on 02/15/22 Introduced care coordination program to the patient - patient would like to speak with a nurse case manager Referral placed to RN Case Manager [place]        SDOH assessments and interventions completed:  Yes  SDOH Interventions Today    Flowsheet Row Most Recent Value  SDOH Interventions   Food Insecurity Interventions Intervention Not Indicated  Housing Interventions Intervention Not Indicated  Transportation Interventions Intervention Not Indicated        Care Coordination Interventions Activated:  Yes  Care Coordination Interventions:  Yes, provided   Follow up plan: Referral made to RN Care Manager    Encounter Outcome:  Pt. Visit Completed   Daneen Schick, BSW, CDP Social Worker, Certified Dementia Practitioner Care Coordination [phone]

## 2022-07-17 DIAGNOSIS — N186 End stage renal disease: Secondary | ICD-10-CM | POA: Diagnosis not present

## 2022-07-17 DIAGNOSIS — D631 Anemia in chronic kidney disease: Secondary | ICD-10-CM | POA: Diagnosis not present

## 2022-07-17 DIAGNOSIS — D509 Iron deficiency anemia, unspecified: Secondary | ICD-10-CM | POA: Diagnosis not present

## 2022-07-17 DIAGNOSIS — Z23 Encounter for immunization: Secondary | ICD-10-CM | POA: Diagnosis not present

## 2022-07-17 DIAGNOSIS — Z992 Dependence on renal dialysis: Secondary | ICD-10-CM | POA: Diagnosis not present

## 2022-07-17 DIAGNOSIS — N2581 Secondary hyperparathyroidism of renal origin: Secondary | ICD-10-CM | POA: Diagnosis not present

## 2022-07-20 DIAGNOSIS — Z23 Encounter for immunization: Secondary | ICD-10-CM | POA: Diagnosis not present

## 2022-07-20 DIAGNOSIS — Z992 Dependence on renal dialysis: Secondary | ICD-10-CM | POA: Diagnosis not present

## 2022-07-20 DIAGNOSIS — D509 Iron deficiency anemia, unspecified: Secondary | ICD-10-CM | POA: Diagnosis not present

## 2022-07-20 DIAGNOSIS — D631 Anemia in chronic kidney disease: Secondary | ICD-10-CM | POA: Diagnosis not present

## 2022-07-20 DIAGNOSIS — N2581 Secondary hyperparathyroidism of renal origin: Secondary | ICD-10-CM | POA: Diagnosis not present

## 2022-07-20 DIAGNOSIS — N186 End stage renal disease: Secondary | ICD-10-CM | POA: Diagnosis not present

## 2022-07-22 DIAGNOSIS — Z23 Encounter for immunization: Secondary | ICD-10-CM | POA: Diagnosis not present

## 2022-07-22 DIAGNOSIS — Z992 Dependence on renal dialysis: Secondary | ICD-10-CM | POA: Diagnosis not present

## 2022-07-22 DIAGNOSIS — D631 Anemia in chronic kidney disease: Secondary | ICD-10-CM | POA: Diagnosis not present

## 2022-07-22 DIAGNOSIS — N186 End stage renal disease: Secondary | ICD-10-CM | POA: Diagnosis not present

## 2022-07-22 DIAGNOSIS — D509 Iron deficiency anemia, unspecified: Secondary | ICD-10-CM | POA: Diagnosis not present

## 2022-07-22 DIAGNOSIS — N2581 Secondary hyperparathyroidism of renal origin: Secondary | ICD-10-CM | POA: Diagnosis not present

## 2022-07-24 DIAGNOSIS — D631 Anemia in chronic kidney disease: Secondary | ICD-10-CM | POA: Diagnosis not present

## 2022-07-24 DIAGNOSIS — Z992 Dependence on renal dialysis: Secondary | ICD-10-CM | POA: Diagnosis not present

## 2022-07-24 DIAGNOSIS — N2581 Secondary hyperparathyroidism of renal origin: Secondary | ICD-10-CM | POA: Diagnosis not present

## 2022-07-24 DIAGNOSIS — Z23 Encounter for immunization: Secondary | ICD-10-CM | POA: Diagnosis not present

## 2022-07-24 DIAGNOSIS — D509 Iron deficiency anemia, unspecified: Secondary | ICD-10-CM | POA: Diagnosis not present

## 2022-07-24 DIAGNOSIS — N186 End stage renal disease: Secondary | ICD-10-CM | POA: Diagnosis not present

## 2022-07-27 DIAGNOSIS — D509 Iron deficiency anemia, unspecified: Secondary | ICD-10-CM | POA: Diagnosis not present

## 2022-07-27 DIAGNOSIS — N2581 Secondary hyperparathyroidism of renal origin: Secondary | ICD-10-CM | POA: Diagnosis not present

## 2022-07-27 DIAGNOSIS — N186 End stage renal disease: Secondary | ICD-10-CM | POA: Diagnosis not present

## 2022-07-27 DIAGNOSIS — Z23 Encounter for immunization: Secondary | ICD-10-CM | POA: Diagnosis not present

## 2022-07-27 DIAGNOSIS — Z992 Dependence on renal dialysis: Secondary | ICD-10-CM | POA: Diagnosis not present

## 2022-07-27 DIAGNOSIS — D631 Anemia in chronic kidney disease: Secondary | ICD-10-CM | POA: Diagnosis not present

## 2022-07-29 DIAGNOSIS — N2581 Secondary hyperparathyroidism of renal origin: Secondary | ICD-10-CM | POA: Diagnosis not present

## 2022-07-29 DIAGNOSIS — N186 End stage renal disease: Secondary | ICD-10-CM | POA: Diagnosis not present

## 2022-07-29 DIAGNOSIS — Z992 Dependence on renal dialysis: Secondary | ICD-10-CM | POA: Diagnosis not present

## 2022-07-29 DIAGNOSIS — D509 Iron deficiency anemia, unspecified: Secondary | ICD-10-CM | POA: Diagnosis not present

## 2022-07-29 DIAGNOSIS — D631 Anemia in chronic kidney disease: Secondary | ICD-10-CM | POA: Diagnosis not present

## 2022-07-29 DIAGNOSIS — Z23 Encounter for immunization: Secondary | ICD-10-CM | POA: Diagnosis not present

## 2022-07-31 DIAGNOSIS — D631 Anemia in chronic kidney disease: Secondary | ICD-10-CM | POA: Diagnosis not present

## 2022-07-31 DIAGNOSIS — N2581 Secondary hyperparathyroidism of renal origin: Secondary | ICD-10-CM | POA: Diagnosis not present

## 2022-07-31 DIAGNOSIS — D509 Iron deficiency anemia, unspecified: Secondary | ICD-10-CM | POA: Diagnosis not present

## 2022-07-31 DIAGNOSIS — Z992 Dependence on renal dialysis: Secondary | ICD-10-CM | POA: Diagnosis not present

## 2022-07-31 DIAGNOSIS — N186 End stage renal disease: Secondary | ICD-10-CM | POA: Diagnosis not present

## 2022-07-31 DIAGNOSIS — Z23 Encounter for immunization: Secondary | ICD-10-CM | POA: Diagnosis not present

## 2022-08-03 ENCOUNTER — Telehealth: Payer: Self-pay | Admitting: *Deleted

## 2022-08-03 ENCOUNTER — Other Ambulatory Visit: Payer: Self-pay

## 2022-08-03 DIAGNOSIS — N186 End stage renal disease: Secondary | ICD-10-CM | POA: Diagnosis not present

## 2022-08-03 DIAGNOSIS — D631 Anemia in chronic kidney disease: Secondary | ICD-10-CM | POA: Diagnosis not present

## 2022-08-03 DIAGNOSIS — Z23 Encounter for immunization: Secondary | ICD-10-CM | POA: Diagnosis not present

## 2022-08-03 DIAGNOSIS — D509 Iron deficiency anemia, unspecified: Secondary | ICD-10-CM | POA: Diagnosis not present

## 2022-08-03 DIAGNOSIS — N2581 Secondary hyperparathyroidism of renal origin: Secondary | ICD-10-CM | POA: Diagnosis not present

## 2022-08-03 DIAGNOSIS — Z992 Dependence on renal dialysis: Secondary | ICD-10-CM | POA: Diagnosis not present

## 2022-08-03 NOTE — Chronic Care Management (AMB) (Signed)
  Care Coordination  Outreach Note  08/03/2022 Name: Victor Thompson MRN: [id number] DOB: 1961/04/25   Care Coordination Outreach Attempts  An unsuccessful telephone outreach was attempted today to reschedule initial call with [facility] for care coordination services as a benefit of their health plan.   Follow Up Plan:  Additional outreach attempts will be made to offer the patient care coordination information and services.   Encounter Outcome:  No Answer  [place]  Direct Dial: [phone]

## 2022-08-03 NOTE — Chronic Care Management (AMB) (Signed)
  Care Coordination   Note   08/03/2022 Name: Victor Thompson MRN: [id number] DOB: 03-Aug-1961  Victor Thompson is a 61 y.o. year old male who sees [name], MD for primary care. I reached out to [name] by phone today to offer care coordination services.  [name] was given information about Care Coordination services today including:   The Care Coordination services include support from the care team which includes your Nurse Coordinator, Clinical Social Worker, or Pharmacist.  The Care Coordination team is here to help remove barriers to the health concerns and goals most important to you. Care Coordination services are voluntary, and the patient may decline or stop services at any time by request to their care team member.   Care Coordination Consent Status: Patient agreed to services and verbal consent obtained.   Follow up plan:  Telephone appointment with care coordination team member scheduled for:  08/18/22  Encounter Outcome:  Pt. Scheduled  [place]  Direct Dial: [phone]

## 2022-08-05 DIAGNOSIS — D509 Iron deficiency anemia, unspecified: Secondary | ICD-10-CM | POA: Diagnosis not present

## 2022-08-05 DIAGNOSIS — D631 Anemia in chronic kidney disease: Secondary | ICD-10-CM | POA: Diagnosis not present

## 2022-08-05 DIAGNOSIS — I129 Hypertensive chronic kidney disease with stage 1 through stage 4 chronic kidney disease, or unspecified chronic kidney disease: Secondary | ICD-10-CM | POA: Diagnosis not present

## 2022-08-05 DIAGNOSIS — N2581 Secondary hyperparathyroidism of renal origin: Secondary | ICD-10-CM | POA: Diagnosis not present

## 2022-08-05 DIAGNOSIS — Z992 Dependence on renal dialysis: Secondary | ICD-10-CM | POA: Diagnosis not present

## 2022-08-05 DIAGNOSIS — Z23 Encounter for immunization: Secondary | ICD-10-CM | POA: Diagnosis not present

## 2022-08-05 DIAGNOSIS — N186 End stage renal disease: Secondary | ICD-10-CM | POA: Diagnosis not present

## 2022-08-07 DIAGNOSIS — D631 Anemia in chronic kidney disease: Secondary | ICD-10-CM | POA: Diagnosis not present

## 2022-08-07 DIAGNOSIS — Z992 Dependence on renal dialysis: Secondary | ICD-10-CM | POA: Diagnosis not present

## 2022-08-07 DIAGNOSIS — N186 End stage renal disease: Secondary | ICD-10-CM | POA: Diagnosis not present

## 2022-08-07 DIAGNOSIS — D509 Iron deficiency anemia, unspecified: Secondary | ICD-10-CM | POA: Diagnosis not present

## 2022-08-07 DIAGNOSIS — N2581 Secondary hyperparathyroidism of renal origin: Secondary | ICD-10-CM | POA: Diagnosis not present

## 2022-08-10 ENCOUNTER — Ambulatory Visit (AMBULATORY_SURGERY_CENTER): Payer: Self-pay | Admitting: *Deleted

## 2022-08-10 ENCOUNTER — Other Ambulatory Visit: Payer: Self-pay

## 2022-08-10 VITALS — Ht 67.0 in | Wt 266.8 lb

## 2022-08-10 DIAGNOSIS — Z992 Dependence on renal dialysis: Secondary | ICD-10-CM | POA: Diagnosis not present

## 2022-08-10 DIAGNOSIS — D509 Iron deficiency anemia, unspecified: Secondary | ICD-10-CM | POA: Diagnosis not present

## 2022-08-10 DIAGNOSIS — N2581 Secondary hyperparathyroidism of renal origin: Secondary | ICD-10-CM | POA: Diagnosis not present

## 2022-08-10 DIAGNOSIS — Z86010 Personal history of colonic polyps: Secondary | ICD-10-CM

## 2022-08-10 DIAGNOSIS — N186 End stage renal disease: Secondary | ICD-10-CM | POA: Diagnosis not present

## 2022-08-10 DIAGNOSIS — D631 Anemia in chronic kidney disease: Secondary | ICD-10-CM | POA: Diagnosis not present

## 2022-08-10 MED ORDER — NA SULFATE-K SULFATE-MG SULF 17.5-3.13-1.6 GM/177ML PO SOLN
1.0000 | Freq: Once | ORAL | 0 refills | Status: AC
  Filled 2022-08-10: qty 354, 2d supply, fill #0

## 2022-08-10 NOTE — Progress Notes (Signed)
Pt's previsit is done over the phone and all paperwork (prep instructions, blank consent form to just read over) sent to patient.  Pt's name and DOB verified at the beginning of the previsit.   Reminded pt to return calls he will receive from [facility] prior to procedure date  No trouble with anesthesia, denies being told they were difficult to intubate, or hx/fam hx of malignant hyperthermia per pt   No egg or soy allergy  No home oxygen use   No medications for weight loss taken  emmi information given  Pt informed that we do not do prior authorizations for prep  2 day Suprep/Miralax given

## 2022-08-11 ENCOUNTER — Other Ambulatory Visit: Payer: Self-pay

## 2022-08-12 DIAGNOSIS — D631 Anemia in chronic kidney disease: Secondary | ICD-10-CM | POA: Diagnosis not present

## 2022-08-12 DIAGNOSIS — D509 Iron deficiency anemia, unspecified: Secondary | ICD-10-CM | POA: Diagnosis not present

## 2022-08-12 DIAGNOSIS — N186 End stage renal disease: Secondary | ICD-10-CM | POA: Diagnosis not present

## 2022-08-12 DIAGNOSIS — N2581 Secondary hyperparathyroidism of renal origin: Secondary | ICD-10-CM | POA: Diagnosis not present

## 2022-08-12 DIAGNOSIS — Z992 Dependence on renal dialysis: Secondary | ICD-10-CM | POA: Diagnosis not present

## 2022-08-13 ENCOUNTER — Encounter (HOSPITAL_COMMUNITY): Payer: Self-pay | Admitting: Gastroenterology

## 2022-08-13 ENCOUNTER — Other Ambulatory Visit: Payer: Self-pay

## 2022-08-14 DIAGNOSIS — N2581 Secondary hyperparathyroidism of renal origin: Secondary | ICD-10-CM | POA: Diagnosis not present

## 2022-08-14 DIAGNOSIS — D631 Anemia in chronic kidney disease: Secondary | ICD-10-CM | POA: Diagnosis not present

## 2022-08-14 DIAGNOSIS — N186 End stage renal disease: Secondary | ICD-10-CM | POA: Diagnosis not present

## 2022-08-14 DIAGNOSIS — Z992 Dependence on renal dialysis: Secondary | ICD-10-CM | POA: Diagnosis not present

## 2022-08-14 DIAGNOSIS — D509 Iron deficiency anemia, unspecified: Secondary | ICD-10-CM | POA: Diagnosis not present

## 2022-08-16 ENCOUNTER — Emergency Department (HOSPITAL_COMMUNITY): Payer: Medicare Other

## 2022-08-16 ENCOUNTER — Other Ambulatory Visit: Payer: Self-pay

## 2022-08-16 ENCOUNTER — Encounter (HOSPITAL_COMMUNITY): Payer: Self-pay | Admitting: *Deleted

## 2022-08-16 ENCOUNTER — Inpatient Hospital Stay (HOSPITAL_COMMUNITY)
Admission: EM | Admit: 2022-08-16 | Discharge: 2022-08-21 | DRG: 308 | Disposition: A | Discharge status: Home / Self Care | Payer: Medicare Other | Attending: Cardiology | Admitting: Cardiology

## 2022-08-16 DIAGNOSIS — K5903 Drug induced constipation: Secondary | ICD-10-CM | POA: Diagnosis not present

## 2022-08-16 DIAGNOSIS — Z95810 Presence of automatic (implantable) cardiac defibrillator: Secondary | ICD-10-CM | POA: Diagnosis not present

## 2022-08-16 DIAGNOSIS — Z8674 Personal history of sudden cardiac arrest: Secondary | ICD-10-CM

## 2022-08-16 DIAGNOSIS — J9601 Acute respiratory failure with hypoxia: Secondary | ICD-10-CM | POA: Diagnosis not present

## 2022-08-16 DIAGNOSIS — I4729 Other ventricular tachycardia: Secondary | ICD-10-CM | POA: Diagnosis not present

## 2022-08-16 DIAGNOSIS — I2510 Atherosclerotic heart disease of native coronary artery without angina pectoris: Secondary | ICD-10-CM | POA: Diagnosis not present

## 2022-08-16 DIAGNOSIS — Z8249 Family history of ischemic heart disease and other diseases of the circulatory system: Secondary | ICD-10-CM

## 2022-08-16 DIAGNOSIS — I420 Dilated cardiomyopathy: Secondary | ICD-10-CM | POA: Diagnosis not present

## 2022-08-16 DIAGNOSIS — Z955 Presence of coronary angioplasty implant and graft: Secondary | ICD-10-CM | POA: Diagnosis not present

## 2022-08-16 DIAGNOSIS — K219 Gastro-esophageal reflux disease without esophagitis: Secondary | ICD-10-CM | POA: Diagnosis present

## 2022-08-16 DIAGNOSIS — Z880 Allergy status to penicillin: Secondary | ICD-10-CM

## 2022-08-16 DIAGNOSIS — D631 Anemia in chronic kidney disease: Secondary | ICD-10-CM | POA: Diagnosis not present

## 2022-08-16 DIAGNOSIS — I5023 Acute on chronic systolic (congestive) heart failure: Secondary | ICD-10-CM | POA: Diagnosis not present

## 2022-08-16 DIAGNOSIS — I10 Essential (primary) hypertension: Secondary | ICD-10-CM | POA: Diagnosis not present

## 2022-08-16 DIAGNOSIS — I5020 Unspecified systolic (congestive) heart failure: Secondary | ICD-10-CM | POA: Diagnosis present

## 2022-08-16 DIAGNOSIS — Z79899 Other long term (current) drug therapy: Secondary | ICD-10-CM | POA: Diagnosis not present

## 2022-08-16 DIAGNOSIS — I472 Ventricular tachycardia, unspecified: Secondary | ICD-10-CM | POA: Diagnosis not present

## 2022-08-16 DIAGNOSIS — N186 End stage renal disease: Secondary | ICD-10-CM | POA: Diagnosis present

## 2022-08-16 DIAGNOSIS — I255 Ischemic cardiomyopathy: Secondary | ICD-10-CM | POA: Diagnosis present

## 2022-08-16 DIAGNOSIS — M898X9 Other specified disorders of bone, unspecified site: Secondary | ICD-10-CM | POA: Diagnosis not present

## 2022-08-16 DIAGNOSIS — R112 Nausea with vomiting, unspecified: Secondary | ICD-10-CM | POA: Diagnosis not present

## 2022-08-16 DIAGNOSIS — I132 Hypertensive heart and chronic kidney disease with heart failure and with stage 5 chronic kidney disease, or end stage renal disease: Secondary | ICD-10-CM | POA: Diagnosis present

## 2022-08-16 DIAGNOSIS — I959 Hypotension, unspecified: Secondary | ICD-10-CM | POA: Diagnosis not present

## 2022-08-16 DIAGNOSIS — I25119 Atherosclerotic heart disease of native coronary artery with unspecified angina pectoris: Secondary | ICD-10-CM | POA: Diagnosis not present

## 2022-08-16 DIAGNOSIS — R570 Cardiogenic shock: Secondary | ICD-10-CM | POA: Diagnosis not present

## 2022-08-16 DIAGNOSIS — J811 Chronic pulmonary edema: Secondary | ICD-10-CM | POA: Diagnosis not present

## 2022-08-16 DIAGNOSIS — R0689 Other abnormalities of breathing: Secondary | ICD-10-CM | POA: Diagnosis not present

## 2022-08-16 DIAGNOSIS — Z91199 Patient's noncompliance with other medical treatment and regimen due to unspecified reason: Secondary | ICD-10-CM | POA: Diagnosis not present

## 2022-08-16 DIAGNOSIS — N2581 Secondary hyperparathyroidism of renal origin: Secondary | ICD-10-CM | POA: Diagnosis not present

## 2022-08-16 DIAGNOSIS — Z4502 Encounter for adjustment and management of automatic implantable cardiac defibrillator: Secondary | ICD-10-CM

## 2022-08-16 DIAGNOSIS — R079 Chest pain, unspecified: Secondary | ICD-10-CM | POA: Diagnosis not present

## 2022-08-16 DIAGNOSIS — I248 Other forms of acute ischemic heart disease: Secondary | ICD-10-CM | POA: Diagnosis present

## 2022-08-16 DIAGNOSIS — Z6841 Body Mass Index (BMI) 40.0 and over, adult: Secondary | ICD-10-CM

## 2022-08-16 DIAGNOSIS — I493 Ventricular premature depolarization: Secondary | ICD-10-CM | POA: Diagnosis present

## 2022-08-16 DIAGNOSIS — K567 Ileus, unspecified: Secondary | ICD-10-CM | POA: Diagnosis not present

## 2022-08-16 DIAGNOSIS — G4733 Obstructive sleep apnea (adult) (pediatric): Secondary | ICD-10-CM | POA: Diagnosis not present

## 2022-08-16 DIAGNOSIS — I480 Paroxysmal atrial fibrillation: Secondary | ICD-10-CM | POA: Diagnosis not present

## 2022-08-16 DIAGNOSIS — Z992 Dependence on renal dialysis: Secondary | ICD-10-CM

## 2022-08-16 DIAGNOSIS — E785 Hyperlipidemia, unspecified: Secondary | ICD-10-CM | POA: Diagnosis present

## 2022-08-16 DIAGNOSIS — E854 Organ-limited amyloidosis: Secondary | ICD-10-CM | POA: Diagnosis not present

## 2022-08-16 DIAGNOSIS — Z888 Allergy status to other drugs, medicaments and biological substances status: Secondary | ICD-10-CM

## 2022-08-16 DIAGNOSIS — N250 Renal osteodystrophy: Secondary | ICD-10-CM | POA: Diagnosis not present

## 2022-08-16 LAB — CBC
HCT: 36.1 % — ABNORMAL LOW (ref 39.0–52.0)
Hemoglobin: 12.5 g/dL — ABNORMAL LOW (ref 13.0–17.0)
MCH: 33.9 pg (ref 26.0–34.0)
MCHC: 34.6 g/dL (ref 30.0–36.0)
MCV: 97.8 fL (ref 80.0–100.0)
Platelets: 205 10*3/uL (ref 150–400)
RBC: 3.69 MIL/uL — ABNORMAL LOW (ref 4.22–5.81)
RDW: 16 % — ABNORMAL HIGH (ref 11.5–15.5)
WBC: 6.7 10*3/uL (ref 4.0–10.5)
nRBC: 0 % (ref 0.0–0.2)

## 2022-08-16 LAB — HEPATIC FUNCTION PANEL
ALT: 13 U/L (ref 0–44)
AST: 19 U/L (ref 15–41)
Albumin: 3.3 g/dL — ABNORMAL LOW (ref 3.5–5.0)
Alkaline Phosphatase: 59 U/L (ref 38–126)
Bilirubin, Direct: 0.3 mg/dL — ABNORMAL HIGH (ref 0.0–0.2)
Indirect Bilirubin: 0.4 mg/dL (ref 0.3–0.9)
Total Bilirubin: 0.7 mg/dL (ref 0.3–1.2)
Total Protein: 7.3 g/dL (ref 6.5–8.1)

## 2022-08-16 LAB — I-STAT CHEM 8, ED
BUN: 61 mg/dL — ABNORMAL HIGH (ref 6–20)
Calcium, Ion: 0.95 mmol/L — ABNORMAL LOW (ref 1.15–1.40)
Chloride: 103 mmol/L (ref 98–111)
Creatinine, Ser: 14.1 mg/dL — ABNORMAL HIGH (ref 0.61–1.24)
Glucose, Bld: 97 mg/dL (ref 70–99)
HCT: 41 % (ref 39.0–52.0)
Hemoglobin: 13.9 g/dL (ref 13.0–17.0)
Potassium: 4.5 mmol/L (ref 3.5–5.1)
Sodium: 138 mmol/L (ref 135–145)
TCO2: 25 mmol/L (ref 22–32)

## 2022-08-16 LAB — HEPATITIS C ANTIBODY: HCV Ab: NONREACTIVE

## 2022-08-16 LAB — BASIC METABOLIC PANEL
Anion gap: 20 — ABNORMAL HIGH (ref 5–15)
BUN: 53 mg/dL — ABNORMAL HIGH (ref 6–20)
CO2: 20 mmol/L — ABNORMAL LOW (ref 22–32)
Calcium: 9.3 mg/dL (ref 8.9–10.3)
Chloride: 100 mmol/L (ref 98–111)
Creatinine, Ser: 12.77 mg/dL — ABNORMAL HIGH (ref 0.61–1.24)
GFR, Estimated: 4 mL/min — ABNORMAL LOW (ref >60)
Glucose, Bld: 92 mg/dL (ref 70–99)
Potassium: 4.5 mmol/L (ref 3.5–5.1)
Sodium: 140 mmol/L (ref 135–145)

## 2022-08-16 LAB — LIPID PANEL
Cholesterol: 230 mg/dL — ABNORMAL HIGH (ref 0–200)
HDL: 27 mg/dL — ABNORMAL LOW (ref >40)
LDL Cholesterol: 164 mg/dL — ABNORMAL HIGH (ref 0–99)
Total CHOL/HDL Ratio: 8.5 RATIO
Triglycerides: 195 mg/dL — ABNORMAL HIGH (ref <150)
VLDL: 39 mg/dL (ref 0–40)

## 2022-08-16 LAB — HEPATITIS B SURFACE ANTIGEN: Hepatitis B Surface Ag: NONREACTIVE

## 2022-08-16 LAB — MAGNESIUM: Magnesium: 2.5 mg/dL — ABNORMAL HIGH (ref 1.7–2.4)

## 2022-08-16 LAB — TROPONIN I (HIGH SENSITIVITY): Troponin I (High Sensitivity): 53 ng/L — ABNORMAL HIGH (ref <18)

## 2022-08-16 LAB — MRSA NEXT GEN BY PCR, NASAL: MRSA by PCR Next Gen: NOT DETECTED

## 2022-08-16 LAB — CREATININE, SERUM
Creatinine, Ser: 13.17 mg/dL — ABNORMAL HIGH (ref 0.61–1.24)
GFR, Estimated: 4 mL/min — ABNORMAL LOW (ref >60)

## 2022-08-16 LAB — TSH: TSH: 3.364 u[IU]/mL (ref 0.350–4.500)

## 2022-08-16 LAB — BRAIN NATRIURETIC PEPTIDE
B Natriuretic Peptide: UNDETERMINED pg/mL (ref 0.0–100.0)
B Natriuretic Peptide: 399.2 pg/mL — ABNORMAL HIGH (ref 0.0–100.0)

## 2022-08-16 LAB — HEPATITIS B CORE ANTIBODY, TOTAL: Hep B Core Total Ab: NONREACTIVE

## 2022-08-16 LAB — HEPATITIS B SURFACE ANTIBODY,QUALITATIVE: Hep B S Ab: REACTIVE — AB

## 2022-08-16 MED ORDER — AMIODARONE HCL IN DEXTROSE 360-4.14 MG/200ML-% IV SOLN
60.0000 mg/h | INTRAVENOUS | Status: DC
Start: 2022-08-16 — End: 2022-08-16
  Administered 2022-08-16: 60 mg/h via INTRAVENOUS

## 2022-08-16 MED ORDER — AMIODARONE LOAD VIA INFUSION
150.0000 mg | Freq: Once | INTRAVENOUS | Status: DC
  Filled 2022-08-16: qty 83.34

## 2022-08-16 MED ORDER — ISOSORBIDE MONONITRATE ER 60 MG PO TB24
60.0000 mg | ORAL_TABLET | Freq: Every day | ORAL | Status: DC
  Administered 2022-08-16: 60 mg via ORAL
  Filled 2022-08-16: qty 2

## 2022-08-16 MED ORDER — CARVEDILOL 3.125 MG PO TABS
3.1250 mg | ORAL_TABLET | Freq: Two times a day (BID) | ORAL | Status: DC
  Administered 2022-08-16 – 2022-08-18 (×5): 3.125 mg via ORAL
  Filled 2022-08-16 (×6): qty 1

## 2022-08-16 MED ORDER — AMIODARONE HCL IN DEXTROSE 360-4.14 MG/200ML-% IV SOLN: 30.0000 mg/h | INTRAVENOUS | Status: DC

## 2022-08-16 MED ORDER — CHLORHEXIDINE GLUCONATE CLOTH 2 % EX PADS
6.0000 | MEDICATED_PAD | Freq: Every day | CUTANEOUS | Status: DC
  Administered 2022-08-16 – 2022-08-21 (×5): 6 via TOPICAL

## 2022-08-16 MED ORDER — RENA-VITE PO TABS
1.0000 | ORAL_TABLET | Freq: Every day | ORAL | Status: DC
  Administered 2022-08-16 – 2022-08-20 (×5): 1 via ORAL
  Filled 2022-08-16 (×5): qty 1

## 2022-08-16 MED ORDER — ASPIRIN 81 MG PO TBEC
81.0000 mg | DELAYED_RELEASE_TABLET | Freq: Every day | ORAL | Status: DC
  Administered 2022-08-16 – 2022-08-21 (×6): 81 mg via ORAL
  Filled 2022-08-16 (×6): qty 1

## 2022-08-16 MED ORDER — ALBUTEROL SULFATE HFA 108 (90 BASE) MCG/ACT IN AERS: 2.0000 | INHALATION_SPRAY | RESPIRATORY_TRACT | Status: DC | PRN

## 2022-08-16 MED ORDER — HEPARIN SODIUM (PORCINE) 5000 UNIT/ML IJ SOLN
5000.0000 [IU] | Freq: Three times a day (TID) | INTRAMUSCULAR | Status: DC
  Administered 2022-08-16 – 2022-08-17 (×4): 5000 [IU] via SUBCUTANEOUS
  Filled 2022-08-16 (×7): qty 1

## 2022-08-16 MED ORDER — AMIODARONE IV BOLUS ONLY 150 MG/100ML
150.0000 mg | Freq: Once | INTRAVENOUS | Status: AC
Start: 2022-08-16 — End: 2022-08-16

## 2022-08-16 MED ORDER — PROSOURCE PLUS PO LIQD
30.0000 mL | Freq: Two times a day (BID) | ORAL | Status: DC
  Administered 2022-08-19: 30 mL via ORAL
  Filled 2022-08-16 (×7): qty 30

## 2022-08-16 MED ORDER — AMIODARONE HCL IN DEXTROSE 360-4.14 MG/200ML-% IV SOLN
30.0000 mg/h | INTRAVENOUS | Status: DC
  Filled 2022-08-16: qty 200

## 2022-08-16 MED ORDER — ORAL CARE MOUTH RINSE: 15.0000 mL | OROMUCOSAL | Status: DC | PRN

## 2022-08-16 MED ORDER — ALBUTEROL SULFATE (2.5 MG/3ML) 0.083% IN NEBU: 3.0000 mL | INHALATION_SOLUTION | Freq: Four times a day (QID) | RESPIRATORY_TRACT | Status: DC | PRN

## 2022-08-16 MED ORDER — MOMETASONE FURO-FORMOTEROL FUM 100-5 MCG/ACT IN AERO
2.0000 | INHALATION_SPRAY | Freq: Two times a day (BID) | RESPIRATORY_TRACT | Status: DC
  Administered 2022-08-17 – 2022-08-20 (×5): 2 via RESPIRATORY_TRACT
  Filled 2022-08-16 (×3): qty 8.8

## 2022-08-16 MED ORDER — AMIODARONE HCL IN DEXTROSE 360-4.14 MG/200ML-% IV SOLN
30.0000 mg/h | INTRAVENOUS | Status: AC
  Administered 2022-08-16 (×3): 60 mg/h via INTRAVENOUS
  Administered 2022-08-17: 30 mg/h via INTRAVENOUS
  Administered 2022-08-17: 60 mg/h via INTRAVENOUS
  Filled 2022-08-16 (×4): qty 200

## 2022-08-16 MED ORDER — AMIODARONE HCL IN DEXTROSE 360-4.14 MG/200ML-% IV SOLN
60.0000 mg/h | INTRAVENOUS | Status: DC
  Filled 2022-08-16: qty 200

## 2022-08-16 MED ORDER — SUCROFERRIC OXYHYDROXIDE 500 MG PO CHEW
1000.0000 mg | CHEWABLE_TABLET | Freq: Three times a day (TID) | ORAL | Status: DC
  Administered 2022-08-16 – 2022-08-20 (×9): 1000 mg via ORAL
  Filled 2022-08-16 (×16): qty 2

## 2022-08-16 MED ORDER — DOXERCALCIFEROL 4 MCG/2ML IV SOLN
7.0000 ug | INTRAVENOUS | Status: DC
  Administered 2022-08-17 – 2022-08-19 (×2): 7 ug via INTRAVENOUS
  Administered 2022-08-21: 8 ug via INTRAVENOUS
  Filled 2022-08-16 (×6): qty 4

## 2022-08-16 MED ORDER — ATORVASTATIN CALCIUM 80 MG PO TABS
80.0000 mg | ORAL_TABLET | Freq: Every day | ORAL | Status: DC
  Administered 2022-08-16 – 2022-08-20 (×5): 80 mg via ORAL
  Filled 2022-08-16 (×5): qty 1

## 2022-08-16 MED ORDER — ACETAMINOPHEN 325 MG PO TABS
650.0000 mg | ORAL_TABLET | ORAL | Status: DC | PRN
  Administered 2022-08-17 – 2022-08-21 (×3): 650 mg via ORAL
  Filled 2022-08-16 (×3): qty 2

## 2022-08-16 MED ORDER — AMIODARONE IV BOLUS ONLY 150 MG/100ML
INTRAVENOUS | Status: AC
  Administered 2022-08-16: 150 mg
  Filled 2022-08-16: qty 100

## 2022-08-16 MED ORDER — METOPROLOL TARTRATE 5 MG/5ML IV SOLN
5.0000 mg | INTRAVENOUS | Status: AC
  Administered 2022-08-16 (×2): 5 mg via INTRAVENOUS

## 2022-08-16 MED ORDER — ALBUTEROL SULFATE (2.5 MG/3ML) 0.083% IN NEBU: 3.0000 mL | INHALATION_SOLUTION | RESPIRATORY_TRACT | Status: DC | PRN

## 2022-08-16 MED ORDER — METOPROLOL TARTRATE 5 MG/5ML IV SOLN: 10.0000 mg | Freq: Once | INTRAVENOUS | Status: DC

## 2022-08-16 MED ORDER — METOPROLOL TARTRATE 5 MG/5ML IV SOLN
10.0000 mg | Freq: Once | INTRAVENOUS | Status: AC
  Administered 2022-08-16: 5 mg via INTRAVENOUS
  Filled 2022-08-16: qty 10

## 2022-08-16 NOTE — ED Notes (Signed)
Lab rejected lavendar top, insufficient amount; needs to be recollected

## 2022-08-16 NOTE — H&P (Addendum)
See consult note to serve as H&P  reneeUrsuy, PA-C  Agree with above. A full consult has been placed.  Carleene Overlie Tobey Lippard,MD

## 2022-08-16 NOTE — ED Triage Notes (Signed)
Pt arrived with [facility] after numerous ICD firings. Pt rolled over in bed and felt the first shock. Per EMS, shocks occurring 4-16mns with ~15 shocks. Pt in usual state of health prior to tonight. Tues, Thurs, Sat dialysis pt, last treatment on Saturday

## 2022-08-16 NOTE — ED Notes (Signed)
Cardiology at bedside.

## 2022-08-16 NOTE — Consult Note (Signed)
[facility] Renal Consultation Note    Indication for Consultation:  Management of ESRD/hemodialysis, anemia, hypertension/volume, and secondary hyperparathyroidism. PCP:  HPI: Victor Thompson is a 61 y.o. male with ESRD, CAD, HFrEF (EF 25-30%), Hx VT arrest (s/p AICD), recurrent VT in 05/2022 who was admitted with VT storm and recurrent ICD firings.  Presented to ED via EMS after being woke from sleep with ICD firing. He reports 28 shocks delivered from the time he woke up until the time he arrived in the ED. No LOC, dizziness. No dyspnea, N/V/D. He has not been sick recently, no fever or chills. Admits that had missed amiodarone at home this weekend. In the ED, telemetry showed intermittent runs of VT. He was loaded with amiodarone with improvement. Labs with Na 140, K 4.5, CO2 20, BUN 53, Cr 12, Ca 9.3, Hgb 13.9, TSH 3.3. EP and cardiology consulted - amiodarone drip adjusted. CXR with pulmonary edema.  Saw in ED bed -- feeling better now, some residual chest and abdominal pain/soreness. Joking with his family at the bedside.  Dialyzes on TTS schedule at [facility] HD unit. He is compliant with treatments, has not missed. Last HD was 9/9 which he completed in entirety. No issues with RUE AVF recently.  Past Medical History:  Diagnosis Date   Acute on chronic systolic CHF (congestive heart failure) ([place]) 06/14/2020   Acute respiratory failure ([place]) 05/28/2022   AICD (automatic cardioverter/defibrillator) present    2015   Allergy    Asthma    no meds   Cardiac arrest ([place]) 2015   Chest pain 05/25/2022   CHF (congestive heart failure) ([facility])    Chronic kidney disease    ckd -stage 5   Coronary artery disease    Hyperlipidemia    Hypertension    Myocardial infarction ([place])    Obesity    Pneumonia    Pulmonary edema    Pulmonary edema 05/28/2022   Shortness of breath dyspnea    Sleep apnea    USES CPAP   Wears glasses    Past Surgical History:  Procedure  Laterality Date   AV FISTULA PLACEMENT Right 03/15/2019   Procedure: RIGHT ARM ARTERIOVENOUS (AV) FISTULA CREATION;  Surgeon: Angelia Mould, MD;  Location: [facility];  Service: Vascular;  Laterality: Right;   COLONOSCOPY     COLONOSCOPY W/ BIOPSIES AND POLYPECTOMY     CORONARY STENT PLACEMENT  2007   IMPLANTABLE CARDIOVERTER DEFIBRILLATOR IMPLANT  2015   RIGHT/LEFT HEART CATH AND CORONARY ANGIOGRAPHY N/A 07/08/2021   Procedure: RIGHT/LEFT HEART CATH AND CORONARY ANGIOGRAPHY;  Surgeon: Larey Dresser, MD;  Location: [facility];  Service: Cardiovascular;  Laterality: N/A;   SUBQ ICD CHANGEOUT N/A 10/13/2020   Procedure: SUBQ ICD CHANGEOUT;  Surgeon: Evans Lance, MD;  Location: [facility];  Service: Cardiovascular;  Laterality: N/A;   Family History  Problem Relation Age of Onset   Hypertension Mother    Liver disease Father    Colon cancer Neg Hx    Esophageal cancer Neg Hx    Rectal cancer Neg Hx    Stomach cancer Neg Hx    Social History:  reports that he has never smoked. He has never used smokeless tobacco. He reports that he does not drink alcohol and does not use drugs.  ROS: As per HPI otherwise negative.  Physical Exam: Vitals:   08/16/22 0915 08/16/22 0930 08/16/22 0949 08/16/22 1045  BP: 107/77 105/87  114/84  Pulse:  82 (!) 41  76  Resp: (!) '22 14  15  '$ Temp:   97.8 F (36.6 C)   TempSrc:   Oral   SpO2: 99% 95%  98%  Weight:      Height:         General: Well developed, well nourished, in no acute distress. Room air. Head: Normocephalic, atraumatic, sclera non-icteric, mucus membranes are moist. Neck: Supple without lymphadenopathy/masses. JVD not elevated. Lungs: Clear bilaterally to auscultation without wheezes, rales, or rhonchi. Breathing is unlabored. Heart: RRR at this moment Abdomen: Soft, non-tender, non-distended with normoactive bowel sounds. No rebound/guarding.  Musculoskeletal:  Strength and tone appear normal for age. Lower  extremities: No edema or ischemic changes, no open wounds. Neuro: Alert and oriented X 3. Moves all extremities spontaneously. Psych:  Responds to questions appropriately with a normal affect. Dialysis Access: RUE AVF + bruit  Allergies  Allergen Reactions   Ace Inhibitors Swelling    Swelling of the tongue   Influenza Vaccines Hives and Swelling    SWELLING REACTION UNSPECIFIED    Ketorolac Swelling    SWELLING REACTION UNSPECIFIED    Lidocaine Swelling    TONGUE SWELLS   Lisinopril Swelling    TONGUE SWELLING Pt reported problem with a BP med which sounded like  lisinopril  But as of 09/19/06,pt had tolerated altace without problem   Penicillins Swelling    TONGUE SWELLS Has patient had a PCN reaction causing immediate rash, facial/tongue/throat swelling, SOB or lightheadedness with hypotension: Yes Has patient had a PCN reaction causing severe rash involving mucus membranes or skin necrosis: No Has patient had a PCN reaction that required hospitalization No Has patient had a PCN reaction occurring within the last 10 years: Yes If all of the above answers are "NO", then may proceed with Cephalosporin use.    Prior to Admission medications   Medication Sig Start Date End Date Taking? Authorizing Provider  albuterol (PROVENTIL) (2.5 MG/3ML) 0.083% nebulizer solution TAKE 3 MLS (2.5 MG TOTAL) BY NEBULIZATION EVERY 6 (SIX) HOURS AS NEEDED FOR WHEEZING OR SHORTNESS OF BREATH. 12/30/21 12/30/22  [name], MD  albuterol (VENTOLIN HFA) 108 (90 Base) MCG/ACT inhaler Inhale 2 puffs into the lungs every 4 (four) hours as needed for wheezing or shortness of breath. 10/01/19   [name], MD  amiodarone (PACERONE) 200 MG tablet Take 1 tablet (200 mg total) by mouth 2 (two) times daily. 06/23/22   Shirley Friar, PA-C  aspirin EC 81 MG tablet Take 1 tablet (81 mg total) by mouth daily. 01/22/16   [name], [name], MD  atorvastatin (LIPITOR) 80 MG tablet Take 1 tablet (80  mg total) by mouth at bedtime. 06/25/22 06/25/23  Larey Dresser, MD  AURYXIA 1 GM 210 MG(Fe) tablet Take 1 tablet (210 mg total) by mouth 3 (three) times daily. 08/12/21   [name], MD  calcium acetate (PHOSLO) 667 MG capsule Take 2 capsule by mouth three times a day with meals 06/09/21     carvedilol (COREG) 6.25 MG tablet Take 0.5 tablets (3.125 mg total) by mouth 2 (two) times daily. 06/11/22 06/11/23  Larey Dresser, MD  cetirizine (ZYRTEC) 10 MG tablet Take 1 tablet (10 mg total) by mouth 2 (two) times daily. 11/12/21   Valentina Shaggy, MD  Cholecalciferol (VITAMIN D-3) 125 MCG (5000 UT) TABS Take 5,000 Units by mouth daily.    [provider]  docusate sodium (COLACE) 100 MG capsule Take 1 capsule (100 mg  total) by mouth 2 (two) times daily. 02/15/22   [name], MD  EPINEPHrine 0.3 mg/0.3 mL IJ SOAJ injection Inject 0.3 mg into the muscle once as needed for up to 2 doses (if worsening tongue swelling, SOB, hypoxia, or other concerns for progressive anaphylaxis). Patient not taking: Reported on 08/10/2022 07/15/21   [name], MD  ethyl chloride spray SPRAY A SMALL AMOUNT THREE TIMES A WEEK JUST PRIOR TO NEEDLE INSERTION 10/26/21   [name], MD  fluticasone [facility]) 50 MCG/ACT nasal spray Place into both nostrils as needed for allergies or rhinitis.    [provider]  hydrALAZINE (APRESOLINE) 25 MG tablet Take 3 tablets (75 mg total) by mouth 3 (three) times daily. 12/15/21   Larey Dresser, MD  isosorbide mononitrate (IMDUR) 60 MG 24 hr tablet Take 60 mg by mouth daily.    [provider]  polyethylene glycol (MIRALAX) 17 g packet Take 17 g by mouth daily. Titrate up as needed 05/05/22   Armbruster, Carlota Raspberry, MD  Rectal Protectant-Emollient (CALMOL-4) 76-10 % SUPP Use as directed once to twice daily 06/18/22   [name], MD  sildenafil (VIAGRA) 50 MG tablet Take 50 mg by mouth daily as needed. Patient not taking: Reported  on 08/10/2022 07/08/22   [provider]  SYMBICORT 80-4.5 MCG/ACT inhaler INHALE TWO PUFFS INTO THE LUNGS TWICE DAILY (BULK) 03/10/22   [name], MD  VELPHORO 500 MG chewable tablet Chew 1,000 mg by mouth 3 (three) times daily. 08/06/22   [provider]   Current Facility-Administered Medications  Medication Dose Route Frequency Provider Last Rate Last Admin   acetaminophen (TYLENOL) tablet 650 mg  650 mg Oral Q4H PRN Baldwin Jamaica, PA-C       albuterol (PROVENTIL) (2.5 MG/3ML) 0.083% nebulizer solution 3 mL  3 mL Nebulization Q4H PRN Jardin, Carla G, RPH       amiodarone (NEXTERONE PREMIX) 360-4.14 MG/200ML-% (1.8 mg/mL) IV infusion  60 mg/hr Intravenous Continuous Evans Lance, MD 33.3 mL/hr at 08/16/22 0902 60 mg/hr at 08/16/22 0902   aspirin EC tablet 81 mg  81 mg Oral Daily Forest[place] Na L, NP   81 mg at 08/16/22 1046   atorvastatin (LIPITOR) tablet 80 mg  80 mg Oral QHS Baldwin Jamaica, PA-C       carvedilol (COREG) tablet 3.125 mg  3.125 mg Oral BID Baldwin Jamaica, PA-C   3.125 mg at 08/16/22 1046   heparin injection 5,000 Units  5,000 Units Subcutaneous Q8H Baldwin Jamaica, PA-C   5,000 Units at 08/16/22 2637   isosorbide mononitrate (IMDUR) 24 hr tablet 60 mg  60 mg Oral Daily Baldwin Jamaica, PA-C   60 mg at 08/16/22 1046   mometasone-formoterol (DULERA) 100-5 MCG/ACT inhaler 2 puff  2 puff Inhalation BID Baldwin Jamaica, PA-C       Current Outpatient Medications  Medication Sig Dispense Refill   albuterol (PROVENTIL) (2.5 MG/3ML) 0.083% nebulizer solution TAKE 3 MLS (2.5 MG TOTAL) BY NEBULIZATION EVERY 6 (SIX) HOURS AS NEEDED FOR WHEEZING OR SHORTNESS OF BREATH. 75 mL 12   albuterol (VENTOLIN HFA) 108 (90 Base) MCG/ACT inhaler Inhale 2 puffs into the lungs every 4 (four) hours as needed for wheezing or shortness of breath. 8 g 6   amiodarone (PACERONE) 200 MG tablet Take 1 tablet (200 mg total) by mouth 2 (two) times daily. 180 tablet 3   aspirin  EC 81 MG tablet Take 1  tablet (81 mg total) by mouth daily. 30 tablet 5   atorvastatin (LIPITOR) 80 MG tablet Take 1 tablet (80 mg total) by mouth at bedtime. 90 tablet 3   AURYXIA 1 GM 210 MG(Fe) tablet Take 1 tablet (210 mg total) by mouth 3 (three) times daily. 270 tablet 1   calcium acetate (PHOSLO) 667 MG capsule Take 2 capsule by mouth three times a day with meals 180 capsule 6   carvedilol (COREG) 6.25 MG tablet Take 0.5 tablets (3.125 mg total) by mouth 2 (two) times daily. 60 tablet 11   cetirizine (ZYRTEC) 10 MG tablet Take 1 tablet (10 mg total) by mouth 2 (two) times daily. 60 tablet 5   Cholecalciferol (VITAMIN D-3) 125 MCG (5000 UT) TABS Take 5,000 Units by mouth daily.     docusate sodium (COLACE) 100 MG capsule Take 1 capsule (100 mg total) by mouth 2 (two) times daily. 60 capsule 4   EPINEPHrine 0.3 mg/0.3 mL IJ SOAJ injection Inject 0.3 mg into the muscle once as needed for up to 2 doses (if worsening tongue swelling, SOB, hypoxia, or other concerns for progressive anaphylaxis). (Patient not taking: Reported on 08/10/2022) 2 each 2   ethyl chloride spray SPRAY A SMALL AMOUNT THREE TIMES A WEEK JUST PRIOR TO NEEDLE INSERTION 116 mL 11   fluticasone (FLONASE) 50 MCG/ACT nasal spray Place into both nostrils as needed for allergies or rhinitis.     hydrALAZINE (APRESOLINE) 25 MG tablet Take 3 tablets (75 mg total) by mouth 3 (three) times daily. 270 tablet 11   isosorbide mononitrate (IMDUR) 60 MG 24 hr tablet Take 60 mg by mouth daily.     polyethylene glycol (MIRALAX) 17 g packet Take 17 g by mouth daily. Titrate up as needed 14 each 0   Rectal Protectant-Emollient (CALMOL-4) 76-10 % SUPP Use as directed once to twice daily 6 suppository 2   sildenafil (VIAGRA) 50 MG tablet Take 50 mg by mouth daily as needed. (Patient not taking: Reported on 08/10/2022)     SYMBICORT 80-4.5 MCG/ACT inhaler INHALE TWO PUFFS INTO THE LUNGS TWICE DAILY (BULK) 10.2 g 6   VELPHORO 500 MG chewable tablet Chew  1,000 mg by mouth 3 (three) times daily.     Labs: Basic Metabolic Panel: Recent Labs  Lab 08/16/22 0757 08/16/22 1004  NA 140  138  --   K 4.5  4.5  --   CL 100  103  --   CO2 20*  --   GLUCOSE 92  97  --   BUN 53*  61*  --   CREATININE 12.77*  14.10* 13.17*  CALCIUM 9.3  --    Liver Function Tests: Recent Labs  Lab 08/16/22 1004  AST 19  ALT 13  ALKPHOS 59  BILITOT 0.7  PROT 7.3  ALBUMIN 3.3*   CBC: Recent Labs  Lab 08/16/22 0757  HGB 13.9  HCT 41.0   Studies/Results: DG Chest Port 1 View  Result Date: 08/16/2022 CLINICAL DATA:  Chest pain. EXAM: PORTABLE CHEST 1 VIEW COMPARISON:  05/25/2022 FINDINGS: 0611 hours. The cardio pericardial silhouette is enlarged. There is pulmonary vascular congestion without overt pulmonary edema. Left-sided ICD again noted. Telemetry leads overlie the chest. IMPRESSION: Enlargement of the cardiopericardial silhouette with pulmonary vascular congestion. Electronically Signed   By: Misty Stanley M.D.   On: 08/16/2022 06:23    Dialysis Orders:  TTS at [facility] 4:15hr, 450/A1.5, EDW 116.4kg (usually leaves ~117kg), 2K/2Ca, UFP #4, AVF, heparin 3000 unit bolus -  Hectoral 12mg IV q HD - no ESA, Hgb > 11  Assessment/Plan:  VT storm: s/p ICD firings since this AM. Had missed amiodarone at home, reloaded this AM - remains on amiodarone drip. Hgb, K, Ca, TSH normal range. IP and HF teams following.  ESRD: Usual TTS schedule. Although his CXR does show some pulm edema, I would be nervous that HD could acutely worse his BP/electrolytes at the moment while the EP/HF teams are trying to regulate his rhythm. He is asymptomatic as far as dyspnea. Will plan to dialyze him tomorrow, per his usual schedule.  Hypertension/volume: Pulm edema on CXR. BP often drops with dialysis. UF as tolerated with next HD.  Anemia: Hgb remains > 11, no ESA needed.  Metabolic bone disease: Ca ok, Phos pending. Continue binders + VDRA.  Nutrition:  Alb low, will add  protein supplements.  CAD/HFrEF  KVeneta Penton PA-C 08/16/2022, 11:05 AM  [employer]

## 2022-08-16 NOTE — ED Notes (Signed)
[employer] rep at the bedside.

## 2022-08-16 NOTE — Consult Note (Addendum)
H&P   Patient ID: Victor Thompson MRN: [id number]; DOB: [birth year]  Admit date: 08/16/2022 Date of Consult: 08/16/2022  PCP:  [name], MD   [facility] Cardiologist:  Loralie Champagne, MD  EP: Dr. Rayann Heman >> Dr. Lovena Le  Patient Profile:   Victor Thompson is a 61 y.o. male with a hx of  hx of VF arrest (2015), CAD (PCI in 2005, unknown details), ICM,. Chronic CHF (systolic), ESRF on HD , OSA w/CPAP, HTN, HLD, recurrent angio edema who is being seen 05/25/2022 for the evaluation of VT  at the request of Dr. Stark Jock.  CADwith prior PCI though cath Aug 2022 noted no obstructive disease Cardiolite 2017: Inferior and inferolateral large fixed defect suggestive of prior infarction, EF 28% Last Echo was Nov 2022, known reduced LVEF 25-30% (unchanged from 2021   Device information BSCi S-ICD implanted  elsewhere 04/11/2014 > gen change 10/13/2020 + Hx of appropriate therapies Amiodarone started June 2023  History of Present Illness:   Victor Thompson was recently here in June for VT/ICD shocks he was observed then to have recurrent NSPMVT he was volume OL required urgent HD with surges of VT that did better afterwards as well as the addition of amiodarone.  Planned for amio '400mg'$  BID x21 days then '200mg'$  BID  He saw Dr. Aundra Dubin in the office 06/11/22, dry weight was lowered and doing better.  He saw andy 06/23/22, also doing OK, no more shocks, no changes were made  He was brought today to he ER via EMS, called 911 with multiple ICD shocks, EMS reviewed, repots witness ICD shocks, lowq BPs, no meds given  In the ER started on amiodarone w/bolus   Labs are pending  The patient reports nothing unusual, no fever, no illness, no missed HD sessions, though has missed his amiodarone for 2 days. Last night/early this AM rolled over in bed to get comfortable and started getting shocked, no reports of CP or symptoms leading up to this AM or of late. No LOC  ICD interrogation  notes: Stable battery and electrode impedance 28 appropriate shocks  Past Medical History:  Diagnosis Date   Acute on chronic systolic CHF (congestive heart failure) ([facility]) 06/14/2020   Acute respiratory failure ([facility]) 05/28/2022   AICD (automatic cardioverter/defibrillator) present    2015   Allergy    Asthma    no meds   Cardiac arrest ([place]) 2015   Chest pain 05/25/2022   CHF (congestive heart failure) ([facility])    Chronic kidney disease    ckd -stage 5   Coronary artery disease    Hyperlipidemia    Hypertension    Myocardial infarction ([facility])    Obesity    Pneumonia    Pulmonary edema    Pulmonary edema 05/28/2022   Shortness of breath dyspnea    Sleep apnea    USES CPAP   Wears glasses     Past Surgical History:  Procedure Laterality Date   AV FISTULA PLACEMENT Right 03/15/2019   Procedure: RIGHT ARM ARTERIOVENOUS (AV) FISTULA CREATION;  Surgeon: Angelia Mould, MD;  Location: [facility];  Service: Vascular;  Laterality: Right;   COLONOSCOPY     COLONOSCOPY W/ BIOPSIES AND POLYPECTOMY     CORONARY STENT PLACEMENT  2007   IMPLANTABLE CARDIOVERTER DEFIBRILLATOR IMPLANT  2015   RIGHT/LEFT HEART CATH AND CORONARY ANGIOGRAPHY N/A 07/08/2021   Procedure: RIGHT/LEFT HEART CATH AND CORONARY ANGIOGRAPHY;  Surgeon: Larey Dresser, MD;  Location: [facility]  CV [facility];  Service: Cardiovascular;  Laterality: N/A;   SUBQ ICD CHANGEOUT N/A 10/13/2020   Procedure: SUBQ ICD CHANGEOUT;  Surgeon: Evans Lance, MD;  Location: [facility];  Service: Cardiovascular;  Laterality: N/A;     Home Medications:  Prior to Admission medications   Medication Sig Start Date End Date Taking? Authorizing Provider  albuterol (PROVENTIL) (2.5 MG/3ML) 0.083% nebulizer solution TAKE 3 MLS (2.5 MG TOTAL) BY NEBULIZATION EVERY 6 (SIX) HOURS AS NEEDED FOR WHEEZING OR SHORTNESS OF BREATH. 12/30/21 12/30/22  [name], MD  albuterol (VENTOLIN HFA) 108 (90 Base) MCG/ACT inhaler Inhale 2 puffs into  the lungs every 4 (four) hours as needed for wheezing or shortness of breath. 10/01/19   [name], MD  amiodarone (PACERONE) 200 MG tablet Take 1 tablet (200 mg total) by mouth 2 (two) times daily. 06/23/22   Shirley Friar, PA-C  aspirin EC 81 MG tablet Take 1 tablet (81 mg total) by mouth daily. 01/22/16   [name], [name], MD  atorvastatin (LIPITOR) 80 MG tablet Take 1 tablet (80 mg total) by mouth at bedtime. 06/25/22 06/25/23  Larey Dresser, MD  AURYXIA 1 GM 210 MG(Fe) tablet Take 1 tablet (210 mg total) by mouth 3 (three) times daily. 08/12/21   [name], MD  calcium acetate (PHOSLO) 667 MG capsule Take 2 capsule by mouth three times a day with meals 06/09/21     carvedilol (COREG) 6.25 MG tablet Take 0.5 tablets (3.125 mg total) by mouth 2 (two) times daily. 06/11/22 06/11/23  Larey Dresser, MD  cetirizine (ZYRTEC) 10 MG tablet Take 1 tablet (10 mg total) by mouth 2 (two) times daily. 11/12/21   Valentina Shaggy, MD  Cholecalciferol (VITAMIN D-3) 125 MCG (5000 UT) TABS Take 5,000 Units by mouth daily.    [provider]  docusate sodium (COLACE) 100 MG capsule Take 1 capsule (100 mg total) by mouth 2 (two) times daily. 02/15/22   [name], MD  EPINEPHrine 0.3 mg/0.3 mL IJ SOAJ injection Inject 0.3 mg into the muscle once as needed for up to 2 doses (if worsening tongue swelling, SOB, hypoxia, or other concerns for progressive anaphylaxis). Patient not taking: Reported on 08/10/2022 07/15/21   [name], MD  ethyl chloride spray SPRAY A SMALL AMOUNT THREE TIMES A WEEK JUST PRIOR TO NEEDLE INSERTION 10/26/21   [name], MD  fluticasone [facility]) 50 MCG/ACT nasal spray Place into both nostrils as needed for allergies or rhinitis.    [provider]  hydrALAZINE (APRESOLINE) 25 MG tablet Take 3 tablets (75 mg total) by mouth 3 (three) times daily. 12/15/21   Larey Dresser, MD  isosorbide mononitrate (IMDUR) 60 MG 24 hr tablet  Take 60 mg by mouth daily.    [provider]  polyethylene glycol (MIRALAX) 17 g packet Take 17 g by mouth daily. Titrate up as needed 05/05/22   Armbruster, Carlota Raspberry, MD  Rectal Protectant-Emollient (CALMOL-4) 76-10 % SUPP Use as directed once to twice daily 06/18/22   [name], MD  sildenafil (VIAGRA) 50 MG tablet Take 50 mg by mouth daily as needed. Patient not taking: Reported on 08/10/2022 07/08/22   [provider]  SYMBICORT 80-4.5 MCG/ACT inhaler INHALE TWO PUFFS INTO THE LUNGS TWICE DAILY (BULK) 03/10/22   [name], MD    Inpatient Medications: Scheduled Meds:  Continuous Infusions:  amiodarone 60 mg/hr (08/16/22 0555)   amiodarone  PRN Meds:   Allergies:    Allergies  Allergen Reactions   Ace Inhibitors Swelling    Swelling of the tongue   Influenza Vaccines Hives and Swelling    SWELLING REACTION UNSPECIFIED    Ketorolac Swelling    SWELLING REACTION UNSPECIFIED    Lidocaine Swelling    TONGUE SWELLS   Lisinopril Swelling    TONGUE SWELLING Pt reported problem with a BP med which sounded like  lisinopril  But as of 09/19/06,pt had tolerated altace without problem   Penicillins Swelling    TONGUE SWELLS Has patient had a PCN reaction causing immediate rash, facial/tongue/throat swelling, SOB or lightheadedness with hypotension: Yes Has patient had a PCN reaction causing severe rash involving mucus membranes or skin necrosis: No Has patient had a PCN reaction that required hospitalization No Has patient had a PCN reaction occurring within the last 10 years: Yes If all of the above answers are "NO", then may proceed with Cephalosporin use.     Social History:   Social History   Socioeconomic History   Marital status: Single    Spouse name: Not on file   Number of children: 3   Years of education: Not on file   Highest education level: Not on file  Occupational History   Occupation: retired  Tobacco Use   Smoking status:  Never   Smokeless tobacco: Never  Vaping Use   Vaping Use: Never used  Substance and Sexual Activity   Alcohol use: No   Drug use: No   Sexual activity: Yes  Other Topics Concern   Not on file  Social History Narrative   ** Merged History Encounter **       Social Determinants of Health   Financial Resource Strain: Not on file  Food Insecurity: No Food Insecurity (07/15/2022)   Hunger Vital Sign    Worried About Running Out of Food in the Last Year: Never true    Ran Out of Food in the Last Year: Never true  Transportation Needs: No Transportation Needs (07/15/2022)   PRAPARE - Hydrologist (Medical): No    Lack of Transportation (Non-Medical): No  Physical Activity: Not on file  Stress: Not on file  Social Connections: Not on file  Intimate Partner Violence: Not on file    Family History:   Family History  Problem Relation Age of Onset   Hypertension Mother    Liver disease Father    Colon cancer Neg Hx    Esophageal cancer Neg Hx    Rectal cancer Neg Hx    Stomach cancer Neg Hx      ROS:  Please see the history of present illness.  All other ROS reviewed and negative.     Physical Exam/Data:   Vitals:   08/16/22 0544 08/16/22 0615 08/16/22 0645  BP: (!) 158/59 122/82 123/84  Pulse: (!) 117 100 98  Resp: (!) '24 14 12  '$ Temp: (!) 97.3 F (36.3 C)    SpO2: 95% 90% 97%   No intake or output data in the 24 hours ending 08/16/22 0702    08/10/2022    8:42 AM 06/23/2022   10:05 AM 06/18/2022    8:42 AM  Last 3 Weights  Weight (lbs) 266 lb 12.1 oz 261 lb 12.8 oz 260 lb 3.2 oz  Weight (kg) 121 kg 118.752 kg 118.026 kg     There is no height or weight on file to calculate BMI.  General:  Well nourished, well developed, in no acute distress, but anxious about getting more shocks HEENT: normal Neck: no JVD Vascular: No carotid bruits; Distal pulses 2+ bilaterally Cardiac:  RRR; w/extrasystoles no murmurs, gallops opr rubs Lungs:   clear to auscultation bilaterally, no wheezing, rhonchi or rales  Abd: soft, nontender, no hepatomegaly  Ext: no edema Musculoskeletal:  No deformities, BUE and BLE strength normal and equal Skin: warm and dry  Neuro:  CNs 2-12 intact, no focal abnormalities noted Psych:  Normal affect   EKG:  The EKG was personally reviewed and demonstrates:   SR NSVTs, PVCs #2 is mostly VTs, with soe sinus beats SR, NSVTs  Telemetry:  Telemetry was personally reviewed and demonstrates:   SR 90's-100 or so, intermittent NSVTs  Relevant CV Studies:  10/21/21: TTE  1. Left ventricular ejection fraction, by estimation, is 25 to 30%. The  left ventricle has severely decreased function. The left ventricle  demonstrates global hypokinesis. The left ventricular internal cavity size  was moderately dilated. Left  ventricular diastolic parameters are consistent with Grade I diastolic  dysfunction (impaired relaxation).   2. Right ventricular systolic function is normal. The right ventricular  size is normal. There is normal pulmonary artery systolic pressure. The  estimated right ventricular systolic pressure is 16.1 mmHg.   3. The mitral valve is normal in structure. Mild mitral valve  regurgitation. No evidence of mitral stenosis.   4. The aortic valve is tricuspid. Aortic valve regurgitation is not  visualized. No aortic stenosis is present.   5. The inferior vena cava is normal in size with greater than 50%  respiratory variability, suggesting right atrial pressure of 3 mmHg.    07/08/21; LHC 1. Normal filling pressures and normal cardiac output.  2. No significant CAD.  Nonischemic cardiomyopathy.   Laboratory Data:  High Sensitivity Troponin:  No results for input(s): "TROPONINIHS" in the last 720 hours.   ChemistryNo results for input(s): "NA", "K", "CL", "CO2", "GLUCOSE", "BUN", "CREATININE", "CALCIUM", "MG", "GFRNONAA", "GFRAA", "ANIONGAP" in the last 168 hours.  No results for input(s):  "PROT", "ALBUMIN", "AST", "ALT", "ALKPHOS", "BILITOT" in the last 168 hours. Lipids No results for input(s): "CHOL", "TRIG", "HDL", "LABVLDL", "LDLCALC", "CHOLHDL" in the last 168 hours.  HematologyNo results for input(s): "WBC", "RBC", "HGB", "HCT", "MCV", "MCH", "MCHC", "RDW", "PLT" in the last 168 hours. Thyroid No results for input(s): "TSH", "FREET4" in the last 168 hours.  BNPNo results for input(s): "BNP", "PROBNP" in the last 168 hours.  DDimer No results for input(s): "DDIMER" in the last 168 hours.   Radiology/Studies:  DG Chest Port 1 View  Result Date: 08/16/2022 CLINICAL DATA:  Chest pain. EXAM: PORTABLE CHEST 1 VIEW COMPARISON:  05/25/2022 FINDINGS: 0611 hours. The cardio pericardial silhouette is enlarged. There is pulmonary vascular congestion without overt pulmonary edema. Left-sided ICD again noted. Telemetry leads overlie the chest. IMPRESSION: Enlargement of the cardiopericardial silhouette with pulmonary vascular congestion. Electronically Signed   By: Misty Stanley M.D.   On: 08/16/2022 06:23     Assessment and Plan:   VT storm Missed his amio  this weekend Got '300mg'$  IV amio bolus ion arrival > gtt Will give another 150 no and 2 doses of IV lopressor Admit to ICD Keep amio at 63 do not titrate down  Labs are pending  NPO for now until rhythm settles  ESRF on HD I have paged nephrology Will leave his renal home meds to them  Chronic CHF Volume with HD HF team is aware  he is here  CAD No angina No obstructive CAD by cath Aug 2022  Dr. Lovena Le has seen and examined the patient   Risk Assessment/Risk Scores:    For questions or updates, please contact [facility] Please consult [email or web address] for contact info under    Signed, Baldwin Jamaica, PA-C  08/16/2022 7:02 AM  EP Attending  Patient seen and examined. At the bedside he is having salvos of both polymorphic and monomorphic VT though he has not been shocked in over an hour. He  missed his meds, including amiodarone. He denies missing HD. He is scheduled for HD tomorrow. He denies anginal symptoms. His bp on my visit was 130/80 and pulse was irregular and 75. Lungs reveal scattered rales and CV reveals an irregular rhythm. Tele is as above.  A/P VT storm - he will be admitted for IV amio and undergo HD tomorrow. Sooner if thought indicated by nephrololgy.  Chronic systolic heart failure - I do not get the impression that he is particularly volume overloaded as he does not appear dypneic at the bedside.  Non-compliance - this will need to be worked on.  ESRD - for HD tomorrow, sooner if judged necessary by the nephrology service.  Carleene Overlie Kaytelyn Glore,MD

## 2022-08-16 NOTE — ED Notes (Signed)
ICD located to L ribcage. Unable to interrogate at this time. Rep paged.   Pt also reports not taking his amiodarone for at least 2 days

## 2022-08-16 NOTE — ED Provider Notes (Signed)
Victor Thompson   CSN: [id number] Arrival date & time: 08/16/22  0537     History  Chief Complaint  Patient presents with   AICD Problem    Victor Thompson is a 61 y.o. male.  Patient is a 61 year old male with extensive past medical history including ischemic cardiomyopathy with pacer defibrillator, congestive heart failure, end-stage renal disease on hemodialysis, hypertension, hyperlipidemia, GERD, sleep apnea.  Patient presenting today for evaluation of his AICD firing.  Patient states he rolled over in bed this morning, then felt a shock from his defibrillator.  He reports multiple additional shocks, then called 911 and was transported here.  In all, patient tells me he has had approximately 15 shocks this morning prior to arrival here.  He denies to me he is having any chest pain.  His last dialysis session was on Saturday and he reports being compliant with this.  He also reports compliance with his amiodarone and other medications.  It appears as though he was admitted several months ago with a similar presentation and required blood pressure and respiratory support in the ICU.  The history is provided by the patient.       Home Medications Prior to Admission medications   Medication Sig Start Date End Date Taking? Authorizing Provider  albuterol (PROVENTIL) (2.5 MG/3ML) 0.083% nebulizer solution TAKE 3 MLS (2.5 MG TOTAL) BY NEBULIZATION EVERY 6 (SIX) HOURS AS NEEDED FOR WHEEZING OR SHORTNESS OF BREATH. 12/30/21 12/30/22  [name], MD  albuterol (VENTOLIN HFA) 108 (90 Base) MCG/ACT inhaler Inhale 2 puffs into the lungs every 4 (four) hours as needed for wheezing or shortness of breath. 10/01/19   [name], MD  amiodarone (PACERONE) 200 MG tablet Take 1 tablet (200 mg total) by mouth 2 (two) times daily. 06/23/22   Shirley Friar, PA-C  aspirin EC 81 MG tablet Take 1 tablet (81 mg total) by mouth daily.  01/22/16   [name], [name], MD  atorvastatin (LIPITOR) 80 MG tablet Take 1 tablet (80 mg total) by mouth at bedtime. 06/25/22 06/25/23  Larey Dresser, MD  AURYXIA 1 GM 210 MG(Fe) tablet Take 1 tablet (210 mg total) by mouth 3 (three) times daily. 08/12/21   [name], MD  calcium acetate (PHOSLO) 667 MG capsule Take 2 capsule by mouth three times a day with meals 06/09/21     carvedilol (COREG) 6.25 MG tablet Take 0.5 tablets (3.125 mg total) by mouth 2 (two) times daily. 06/11/22 06/11/23  Larey Dresser, MD  cetirizine (ZYRTEC) 10 MG tablet Take 1 tablet (10 mg total) by mouth 2 (two) times daily. 11/12/21   Valentina Shaggy, MD  Cholecalciferol (VITAMIN D-3) 125 MCG (5000 UT) TABS Take 5,000 Units by mouth daily.    [provider]  docusate sodium (COLACE) 100 MG capsule Take 1 capsule (100 mg total) by mouth 2 (two) times daily. 02/15/22   [name], MD  EPINEPHrine 0.3 mg/0.3 mL IJ SOAJ injection Inject 0.3 mg into the muscle once as needed for up to 2 doses (if worsening tongue swelling, SOB, hypoxia, or other concerns for progressive anaphylaxis). Patient not taking: Reported on 08/10/2022 07/15/21   [name], MD  ethyl chloride spray SPRAY A SMALL AMOUNT THREE TIMES A WEEK JUST PRIOR TO NEEDLE INSERTION 10/26/21   [name], MD  fluticasone [facility]) 50 MCG/ACT nasal spray Place into both nostrils as needed for allergies or rhinitis.  [provider]  hydrALAZINE (APRESOLINE) 25 MG tablet Take 3 tablets (75 mg total) by mouth 3 (three) times daily. 12/15/21   Larey Dresser, MD  isosorbide mononitrate (IMDUR) 60 MG 24 hr tablet Take 60 mg by mouth daily.    [provider]  polyethylene glycol (MIRALAX) 17 g packet Take 17 g by mouth daily. Titrate up as needed 05/05/22   Armbruster, Carlota Raspberry, MD  Rectal Protectant-Emollient (CALMOL-4) 76-10 % SUPP Use as directed once to twice daily 06/18/22   [name], MD  sildenafil  (VIAGRA) 50 MG tablet Take 50 mg by mouth daily as needed. Patient not taking: Reported on 08/10/2022 07/08/22   [provider]  SYMBICORT 80-4.5 MCG/ACT inhaler INHALE TWO PUFFS INTO THE LUNGS TWICE DAILY (BULK) 03/10/22   [name], MD      Allergies    Ace inhibitors, Influenza vaccines, Ketorolac, Lidocaine, Lisinopril, and Penicillins    Review of Systems   Review of Systems  All other systems reviewed and are negative.   Physical Exam Updated Vital Signs BP (!) 158/59 (BP Location: Left Arm)   Pulse (!) 117   Temp (!) 97.3 F (36.3 C)   Resp (!) 24   SpO2 95%  Physical Exam Vitals and nursing Thompson reviewed.  Constitutional:      General: He is not in acute distress.    Appearance: He is well-developed. He is not diaphoretic.  HENT:     Head: Normocephalic and atraumatic.  Cardiovascular:     Rate and Rhythm: Normal rate and regular rhythm.     Heart sounds: No murmur heard.    No friction rub.  Pulmonary:     Effort: Pulmonary effort is normal. No respiratory distress.     Breath sounds: Normal breath sounds. No wheezing or rales.  Abdominal:     General: Bowel sounds are normal. There is no distension.     Palpations: Abdomen is soft.     Tenderness: There is no abdominal tenderness.  Musculoskeletal:        General: Normal range of motion.     Cervical back: Normal range of motion and neck supple.  Skin:    General: Skin is warm and dry.  Neurological:     Mental Status: He is alert and oriented to person, place, and time.     Coordination: Coordination normal.     ED Results / Procedures / Treatments   Labs (all labs ordered are listed, but only abnormal results are displayed) Labs Reviewed - No data to display  EKG EKG Interpretation  Date/Time:  Monday August 16 2022 05:40:26 EDT Ventricular Rate:  189 PR Interval:    QRS Duration: 171 QT Interval:  398 QTC Calculation: 558 R Axis:   204 Text Interpretation: Ventricular  tachycardia  Confirmed by Veryl Speak [phone]) on 08/16/2022 5:54:03 AM  Radiology No results found.  Procedures Procedures    Medications Ordered in ED Medications  amiodarone (NEXTERONE) 1.8 mg/mL load via infusion 150 mg (has no administration in time range)    Followed by  amiodarone (NEXTERONE PREMIX) 360-4.14 MG/200ML-% (1.8 mg/mL) IV infusion (has no administration in time range)    Followed by  amiodarone (NEXTERONE PREMIX) 360-4.14 MG/200ML-% (1.8 mg/mL) IV infusion (has no administration in time range)  amiodarone (NEXTERONE) 150-4.21 MG/100ML-% bolus (0 mg  Stopped 08/16/22 0547)    ED Course/ Medical Decision Making/ A&P  Patient is a 61 year old male with extensive past medical history as  per HPI presenting with complaints of receiving shocks from his defibrillator.  He reports rolling over in bed this morning, then receiving a shock.  He reports receiving a total of 15 shocks this morning.  He arrives here with intermittent episodes of ventricular tachycardia that are sustained.  He initially reported that he was compliant with his amiodarone, but now tells me he has not taken it in the past 2 days.  He was given an IV bolus of 150 mg of amiodarone followed by an amiodarone drip and seems to be having some improvement in the frequency of his V. tach.    Laboratory studies are currently pending, but patient does tell me he attended dialysis on Saturday  I have spoken with the PA Dayna from cardiology and patient will be evaluated by them.  CRITICAL CARE Performed by: Veryl Speak Total critical care time: 40 minutes Critical care time was exclusive of separately billable procedures and treating other patients. Critical care was necessary to treat or prevent imminent or life-threatening deterioration. Critical care was time spent personally by me on the following activities: development of treatment plan with patient and/or surrogate as well as nursing, discussions with  consultants, evaluation of patient's response to treatment, examination of patient, obtaining history from patient or surrogate, ordering and performing treatments and interventions, ordering and review of laboratory studies, ordering and review of radiographic studies, pulse oximetry and re-evaluation of patient's condition.   Final Clinical Impression(s) / ED Diagnoses Final diagnoses:  None    Rx / DC Orders ED Discharge Orders     None         Veryl Speak, MD 08/16/22 [phone]

## 2022-08-16 NOTE — ED Notes (Signed)
Gregory notified for interrogation

## 2022-08-16 NOTE — Progress Notes (Signed)
Pt receives out-pt HD at [facility] on [facility]. Pt arrives at 5:15 am for 5:30 am chair time. Will assist as needed.  Melven Sartorius Renal Navigator [phone]

## 2022-08-16 NOTE — ED Notes (Signed)
Phlebotomy to attempt labs

## 2022-08-16 NOTE — Plan of Care (Signed)
  Problem: Education: Goal: Knowledge of General Education information will improve Description: Including pain rating scale, medication(s)/side effects and non-pharmacologic comfort measures Outcome: Progressing   Problem: Clinical Measurements: Goal: Ability to maintain clinical measurements within normal limits will improve Outcome: Progressing Goal: Will remain free from infection Outcome: Progressing Goal: Diagnostic test results will improve Outcome: Progressing Goal: Respiratory complications will improve Outcome: Progressing Goal: Cardiovascular complication will be avoided Outcome: Progressing   Problem: Nutrition: Goal: Adequate nutrition will be maintained Outcome: Progressing   Problem: Pain Managment: Goal: General experience of comfort will improve Outcome: Progressing

## 2022-08-16 NOTE — ED Provider Notes (Signed)
  Physical Exam  BP 114/74   Pulse (!) 45   Temp (!) 97.3 F (36.3 C)   Resp (!) 22   Ht '5\' 7"'$  (1.702 m)   Wt 123 kg   SpO2 95%   BMI 42.47 kg/m   Physical Exam Patient awake alert and mentating appropriately. Right upper extremity AV fistula. Tachycardic  Procedures  Procedures CRITICAL CARE Performed by: Elgie Congo   Total critical care time: 35 minutes  Critical care time was exclusive of separately billable procedures and treating other patients.  Critical care was necessary to treat or prevent imminent or life-threatening deterioration.  Critical care was time spent personally by me on the following activities: development of treatment plan with patient and/or surrogate as well as nursing, discussions with consultants, evaluation of patient's response to treatment, examination of patient, obtaining history from patient or surrogate, ordering and performing treatments and interventions, ordering and review of laboratory studies, ordering and review of radiographic studies, pulse oximetry and re-evaluation of patient's condition.  ED Course / MDM   Clinical Course as of 08/16/22 0902  Mon Aug 16, 2022  0658 Received signout from previous attending.  ESRD patient last dialysis Saturday and AICD presented with intermittent V. tach and multiple shocks from AICD.  Currently on amiodarone drip. Follow up labs and cardiology consult. [VB]  0902 An i-STAT was obtained with normal potassium 4.5.  Creatinine 14.1 with known ESRD.  Troponin 53 in the setting of V. tach with multiple ICD shocks 28 recorded by tach.  He has been admitted to cardiology for further work-up and management.  They would like him to remain on amiodarone drip at 60. [VB]    Clinical Course User Index [VB] Elgie Congo, MD   Medical Decision Making Amount and/or Complexity of Data Reviewed Labs: ordered. Radiology: ordered.  Risk Prescription drug management. Decision regarding  hospitalization.          Elgie Congo, MD 08/16/22 [phone]

## 2022-08-16 NOTE — ED Notes (Signed)
150 mg amio bolus given verbal order from cardiology at bedside.

## 2022-08-16 NOTE — Consult Note (Addendum)
Advanced Heart Failure Team Consult Note   Primary Physician: [name], MD PCP-Cardiologist:  Loralie Champagne, MD  Reason for Consultation: Acute on Chronic systolic heart failure in setting of VT storm  HPI:    Victor Thompson is seen today for evaluation of acute on chronic systolic heart failure in setting of VT storm at the request of Dr. Lovena Le EP.   Victor Thompson is a 61 y.o. with history of CAD, ischemic CMP, and ESRD.  He had PCI in 7782, uncertain what vessel was involved. Echo 2/22: EF 25-30% with moderate LV dilation and normal RV.  He had VT arrest in 2015, has [employer] Jude subcutaneous ICD.  [name] in 2/22.  LHC/RHC was done in 8/22, showing no significant CAD and stable hemodynamics.    Echo 11/22 EF 25-30%, moderate LV dilation, diffuse hypokinesis, normal RV.    Patient had ICD shock in 6/23 while at HD => VT storm.  He was noted to be volume overloaded.  He was started on amiodarone.     Presented to the ED after being woken up from his sleep around 5 am with shock delivery. Total of 28 shocks delivered until arrival to ED. Hasn't missed any dialysis treatments, last Saturday. He did miss 3 days worth of amiodarone. Compliant with all other meds, wears CPAP. Prior to today had no CP or SOB. Denies CP and SOB on assessment.   10/21/21: TTE  1. Left ventricular ejection fraction, by estimation, is 25 to 30%. The  left ventricle has severely decreased function. The left ventricle  demonstrates global hypokinesis. The left ventricular internal cavity size  was moderately dilated. Left  ventricular diastolic parameters are consistent with Grade I diastolic  dysfunction (impaired relaxation).   2. Right ventricular systolic function is normal. The right ventricular  size is normal. There is normal pulmonary artery systolic pressure. The  estimated right ventricular systolic pressure is 42.3 mmHg.   3. The mitral valve is normal in structure. Mild mitral valve   regurgitation. No evidence of mitral stenosis.   4. The aortic valve is tricuspid. Aortic valve regurgitation is not  visualized. No aortic stenosis is present.   5. The inferior vena cava is normal in size with greater than 50%  respiratory variability, suggesting right atrial pressure of 3 mmHg.    07/08/21: LHC 1. Normal filling pressures and normal cardiac output.  2. No significant CAD.  - NICM  Review of Systems: [y] = yes, '[ ]'$  = no   General: Weight gain '[ ]'$ ; Weight loss '[ ]'$ ; Anorexia '[ ]'$ ; Fatigue '[ ]'$ ; Fever '[ ]'$ ; Chills '[ ]'$ ; Weakness '[ ]'$   Cardiac: Chest pain/pressure [Y ]; Resting SOB [ Y]; Exertional SOB '[ ]'$ ; Orthopnea '[ ]'$ ; Pedal Edema '[ ]'$ ; Palpitations '[ ]'$ ; Syncope '[ ]'$ ; Presyncope '[ ]'$ ; Paroxysmal nocturnal dyspnea'[ ]'$   Pulmonary: Cough '[ ]'$ ; Wheezing'[ ]'$ ; Hemoptysis'[ ]'$ ; Sputum '[ ]'$ ; Snoring '[ ]'$   GI: Vomiting'[ ]'$ ; Dysphagia'[ ]'$ ; Melena'[ ]'$ ; Hematochezia '[ ]'$ ; Heartburn'[ ]'$ ; Abdominal pain '[ ]'$ ; Constipation '[ ]'$ ; Diarrhea '[ ]'$ ; BRBPR '[ ]'$   GU: Hematuria'[ ]'$ ; Dysuria '[ ]'$ ; Nocturia'[ ]'$   Vascular: Pain in legs with walking '[ ]'$ ; Pain in feet with lying flat '[ ]'$ ; Non-healing sores '[ ]'$ ; Stroke '[ ]'$ ; TIA '[ ]'$ ; Slurred speech '[ ]'$ ;  Neuro: Headaches'[ ]'$ ; Vertigo'[ ]'$ ; Seizures'[ ]'$ ; Paresthesias'[ ]'$ ;Blurred vision '[ ]'$ ; Diplopia '[ ]'$ ; Vision changes '[ ]'$   Ortho/Skin: Arthritis '[ ]'$ ;  Joint pain '[ ]'$ ; Muscle pain '[ ]'$ ; Joint swelling '[ ]'$ ; Back Pain '[ ]'$ ; Rash '[ ]'$   Psych: Depression'[ ]'$ ; Anxiety'[ ]'$   Heme: Bleeding problems '[ ]'$ ; Clotting disorders '[ ]'$ ; Anemia '[ ]'$   Endocrine: Diabetes '[ ]'$ ; Thyroid dysfunction'[ ]'$   Home Medications Prior to Admission medications   Medication Sig Start Date End Date Taking? Authorizing Provider  albuterol (PROVENTIL) (2.5 MG/3ML) 0.083% nebulizer solution TAKE 3 MLS (2.5 MG TOTAL) BY NEBULIZATION EVERY 6 (SIX) HOURS AS NEEDED FOR WHEEZING OR SHORTNESS OF BREATH. 12/30/21 12/30/22  [name], MD  albuterol (VENTOLIN HFA) 108 (90 Base) MCG/ACT inhaler Inhale 2 puffs into the lungs every 4  (four) hours as needed for wheezing or shortness of breath. 10/01/19   [name], MD  amiodarone (PACERONE) 200 MG tablet Take 1 tablet (200 mg total) by mouth 2 (two) times daily. 06/23/22   Shirley Friar, PA-C  aspirin EC 81 MG tablet Take 1 tablet (81 mg total) by mouth daily. 01/22/16   [name], [name], MD  atorvastatin (LIPITOR) 80 MG tablet Take 1 tablet (80 mg total) by mouth at bedtime. 06/25/22 06/25/23  Larey Dresser, MD  AURYXIA 1 GM 210 MG(Fe) tablet Take 1 tablet (210 mg total) by mouth 3 (three) times daily. 08/12/21   [name], MD  calcium acetate (PHOSLO) 667 MG capsule Take 2 capsule by mouth three times a day with meals 06/09/21     carvedilol (COREG) 6.25 MG tablet Take 0.5 tablets (3.125 mg total) by mouth 2 (two) times daily. 06/11/22 06/11/23  Larey Dresser, MD  cetirizine (ZYRTEC) 10 MG tablet Take 1 tablet (10 mg total) by mouth 2 (two) times daily. 11/12/21   Valentina Shaggy, MD  Cholecalciferol (VITAMIN D-3) 125 MCG (5000 UT) TABS Take 5,000 Units by mouth daily.    [provider]  docusate sodium (COLACE) 100 MG capsule Take 1 capsule (100 mg total) by mouth 2 (two) times daily. 02/15/22   [name], MD  EPINEPHrine 0.3 mg/0.3 mL IJ SOAJ injection Inject 0.3 mg into the muscle once as needed for up to 2 doses (if worsening tongue swelling, SOB, hypoxia, or other concerns for progressive anaphylaxis). Patient not taking: Reported on 08/10/2022 07/15/21   [name], MD  ethyl chloride spray SPRAY A SMALL AMOUNT THREE TIMES A WEEK JUST PRIOR TO NEEDLE INSERTION 10/26/21   [name], MD  fluticasone [facility]) 50 MCG/ACT nasal spray Place into both nostrils as needed for allergies or rhinitis.    [provider]  hydrALAZINE (APRESOLINE) 25 MG tablet Take 3 tablets (75 mg total) by mouth 3 (three) times daily. 12/15/21   Larey Dresser, MD  isosorbide mononitrate (IMDUR) 60 MG 24 hr tablet Take 60 mg by mouth  daily.    [provider]  polyethylene glycol (MIRALAX) 17 g packet Take 17 g by mouth daily. Titrate up as needed 05/05/22   Armbruster, Carlota Raspberry, MD  Rectal Protectant-Emollient (CALMOL-4) 76-10 % SUPP Use as directed once to twice daily 06/18/22   [name], MD  sildenafil (VIAGRA) 50 MG tablet Take 50 mg by mouth daily as needed. Patient not taking: Reported on 08/10/2022 07/08/22   [provider]  SYMBICORT 80-4.5 MCG/ACT inhaler INHALE TWO PUFFS INTO THE LUNGS TWICE DAILY (BULK) 03/10/22   [name], MD    Past Medical History: Past Medical History:  Diagnosis Date   Acute on chronic systolic CHF (  congestive heart failure) ([place]) 06/14/2020   Acute respiratory failure ([place]) 05/28/2022   AICD (automatic cardioverter/defibrillator) present    2015   Allergy    Asthma    no meds   Cardiac arrest [facility]) 2015   Chest pain 05/25/2022   CHF (congestive heart failure) ([facility])    Chronic kidney disease    ckd -stage 5   Coronary artery disease    Hyperlipidemia    Hypertension    Myocardial infarction [facility])    Obesity    Pneumonia    Pulmonary edema    Pulmonary edema 05/28/2022   Shortness of breath dyspnea    Sleep apnea    USES CPAP   Wears glasses     Past Surgical History: Past Surgical History:  Procedure Laterality Date   AV FISTULA PLACEMENT Right 03/15/2019   Procedure: RIGHT ARM ARTERIOVENOUS (AV) FISTULA CREATION;  Surgeon: Angelia Mould, MD;  Location: [facility];  Service: Vascular;  Laterality: Right;   COLONOSCOPY     COLONOSCOPY W/ BIOPSIES AND POLYPECTOMY     CORONARY STENT PLACEMENT  2007   IMPLANTABLE CARDIOVERTER DEFIBRILLATOR IMPLANT  2015   RIGHT/LEFT HEART CATH AND CORONARY ANGIOGRAPHY N/A 07/08/2021   Procedure: RIGHT/LEFT HEART CATH AND CORONARY ANGIOGRAPHY;  Surgeon: Larey Dresser, MD;  Location: [place];  Service: Cardiovascular;  Laterality: N/A;   SUBQ ICD CHANGEOUT N/A 10/13/2020   Procedure: SUBQ ICD  CHANGEOUT;  Surgeon: Evans Lance, MD;  Location: [facility];  Service: Cardiovascular;  Laterality: N/A;    Family History: Family History  Problem Relation Age of Onset   Hypertension Mother    Liver disease Father    Colon cancer Neg Hx    Esophageal cancer Neg Hx    Rectal cancer Neg Hx    Stomach cancer Neg Hx     Social History: Social History   Socioeconomic History   Marital status: Single    Spouse name: Not on file   Number of children: 3   Years of education: Not on file   Highest education level: Not on file  Occupational History   Occupation: retired  Tobacco Use   Smoking status: Never   Smokeless tobacco: Never  Vaping Use   Vaping Use: Never used  Substance and Sexual Activity   Alcohol use: No   Drug use: No   Sexual activity: Yes  Other Topics Concern   Not on file  Social History Narrative   ** Merged History Encounter **       Social Determinants of Health   Financial Resource Strain: Not on file  Food Insecurity: No Food Insecurity (07/15/2022)   Hunger Vital Sign    Worried About Running Out of Food in the Last Year: Never true    Ran Out of Food in the Last Year: Never true  Transportation Needs: No Transportation Needs (07/15/2022)   PRAPARE - Hydrologist (Medical): No    Lack of Transportation (Non-Medical): No  Physical Activity: Not on file  Stress: Not on file  Social Connections: Not on file    Allergies:  Allergies  Allergen Reactions   Ace Inhibitors Swelling    Swelling of the tongue   Influenza Vaccines Hives and Swelling    SWELLING REACTION UNSPECIFIED    Ketorolac Swelling    SWELLING REACTION UNSPECIFIED    Lidocaine Swelling    TONGUE SWELLS   Lisinopril Swelling    TONGUE SWELLING  Pt reported problem with a BP med which sounded like  lisinopril  But as of 09/19/06,pt had tolerated altace without problem   Penicillins Swelling    TONGUE SWELLS Has patient had a PCN  reaction causing immediate rash, facial/tongue/throat swelling, SOB or lightheadedness with hypotension: Yes Has patient had a PCN reaction causing severe rash involving mucus membranes or skin necrosis: No Has patient had a PCN reaction that required hospitalization No Has patient had a PCN reaction occurring within the last 10 years: Yes If all of the above answers are "NO", then may proceed with Cephalosporin use.     Objective:    Vital Signs:   Temp:  [97.3 F (36.3 C)] 97.3 F (36.3 C) (09/11 0544) Pulse Rate:  [45-117] 45 (09/11 0830) Resp:  [10-28] 22 (09/11 0830) BP: (107-158)/(59-115) 114/74 (09/11 0830) SpO2:  [90 %-98 %] 95 % (09/11 0830) Weight:  [644 kg] 123 kg (09/11 0744)    Weight change: Filed Weights   08/16/22 0744  Weight: 123 kg    Intake/Output:  No intake or output data in the 24 hours ending 08/16/22 0913    Physical Exam    General:  well appearing. Looks stated age. No respiratory difficulty HEENT: normal Neck: supple. JVD ~8 cm. Carotids 2+ bilat; no bruits. No lymphadenopathy or thyromegaly appreciated. Cor: PMI nondisplaced. Regular rate & rhythm. No rubs, gallops or murmurs. Lungs: clear Abdomen: soft, nontender, nondistended. No hepatosplenomegaly. No bruits or masses. Good bowel sounds. Extremities: no cyanosis, clubbing, rash, edema. Graft RUE Neuro: alert & oriented x 3, cranial nerves grossly intact. moves all 4 extremities w/o difficulty. Affect pleasant.   Telemetry   NSR 80s w/ PVC's (Personally reviewed)    EKG    Extreme tachycardia with wide complex 174bpm A fib NSVT, RBBB 145 bpm  Labs   Basic Metabolic Panel: Recent Labs  Lab 08/16/22 0757  NA 140  138  K 4.5  4.5  CL 100  103  CO2 20*  GLUCOSE 92  97  BUN 53*  61*  CREATININE 12.77*  14.10*  CALCIUM 9.3  MG 2.5*    Liver Function Tests: No results for input(s): "AST", "ALT", "ALKPHOS", "BILITOT", "PROT", "ALBUMIN" in the last 168 hours. No results  for input(s): "LIPASE", "AMYLASE" in the last 168 hours. No results for input(s): "AMMONIA" in the last 168 hours.  CBC: Recent Labs  Lab 08/16/22 0757  HGB 13.9  HCT 41.0    Cardiac Enzymes: No results for input(s): "CKTOTAL", "CKMB", "CKMBINDEX", "TROPONINI" in the last 168 hours.  BNP: BNP (last 3 results) Recent Labs    05/25/22 0705  BNP 1,355.4*    ProBNP (last 3 results) No results for input(s): "PROBNP" in the last 8760 hours.   CBG: No results for input(s): "GLUCAP" in the last 168 hours.  Coagulation Studies: No results for input(s): "LABPROT", "INR" in the last 72 hours.   Imaging   DG Chest Port 1 View  Result Date: 08/16/2022 CLINICAL DATA:  Chest pain. EXAM: PORTABLE CHEST 1 VIEW COMPARISON:  05/25/2022 FINDINGS: 0611 hours. The cardio pericardial silhouette is enlarged. There is pulmonary vascular congestion without overt pulmonary edema. Left-sided ICD again noted. Telemetry leads overlie the chest. IMPRESSION: Enlargement of the cardiopericardial silhouette with pulmonary vascular congestion. Electronically Signed   By: Misty Stanley M.D.   On: 08/16/2022 06:23     Medications:     Current Medications:  atorvastatin  80 mg Oral QHS   carvedilol  3.125 mg  Oral BID   heparin  5,000 Units Subcutaneous Q8H   isosorbide mononitrate  60 mg Oral Daily   mometasone-formoterol  2 puff Inhalation BID    Infusions:  amiodarone 60 mg/hr (08/16/22 0902)      Patient Profile   Victor Thompson is a 61 y.o. with history of CAD, ischemic CMP, and ESRD.  He had PCI in 0867, uncertain what vessel was involved. He has not had coronary angiography since that time due to advanced CKD.  He has had ischemic cardiomyopathy x years.  Echo 2/22: EF 25-30% with moderate LV dilation and normal RV.  He had VT arrest in 2015, has St Jude subcutaneous ICD.  [name] in 2/22.  LHC/RHC was done in 8/22, showing no significant CAD and stable hemodynamics. AHF team asked  to see for acute on chronic systolic heart failure in setting of VT storm.  Assessment/Plan   VT storm - VT storm episode in 6/23 and amiodarone started.  - Woke up today around 5am with shock, has BSCi S-ICD, total of 28 shocks - Was on amiodarone 200 mg bid - Being loaded with IV amiodarone, continue at 60/hr - Checking LFT's, TSH 2. Acute on chronic systolic CHF: Ischemic cardiomyopathy, echo in 2/22 with EF 25-30%, moderate LV dilation, mild LVH, normal RV size and systolic function, mild MR. He has a [employer] subcutaneous ICD. Echo 11/22 EF 25-30%, moderate LV dilation, normal RV.  He is not volume overloaded on exam, stable NYHA II symptoms. Medication titration limited by ESRD. - Continue Coreg at 3.125 mg bid - Hold hydralazine 75 mg tid and continue Imdur 60 mg daily.   - ECG with IVCD 150 msec, thought not to be good candidate for CRT upgrade (not true LBBB) - BNP pending 3. CAD: PCI in 2005, unsure what vessel.  Coronary angiography in 8/22 showed no significant CAD. No chest pain. - Will check lipids while admitted - Continue atorvastatin - Continue ASA 81 daily.  4. ESRD: Since 2/22.  Turned down for renal transplant due to low EF.  - Avoid hypotension - Nephrology may need to follow 5. OSA: Continue CPAP.  6. Angioedema: Recurrent episodes.  He is not on a med that commonly causes angioedema.  No recent episodes.  Length of Stay: [place], AGACNP-BC  08/16/2022, 9:13 AM  Advanced Heart Failure Team Pager [phone] (M-F; 7a - 5p)  Please contact [facility] for night-coverage after hours (4p -7a ) and weekends on [email or web address]  Patient seen and examined with the above-signed Advanced Practice Provider and/or Housestaff. I personally reviewed laboratory data, imaging studies and relevant notes. I independently examined the patient and formulated the important aspects of the plan. I have edited the note to reflect any of my changes or salient points. I have personally  discussed the plan with the patient and/or family.  61 y/o male with ESRD, OSA and systolic HF due to probable NICM EF 25-30% (though he carries history of remote PCI). Has been doing reasonably well from HF perspective NYHA II.  Admitted today with VT strom with 28 ICD discharges. Seen by EP, VT now quiescent on IV amio. K 4.5 Mg 2.5  hstrop 53 No angina  Has not missed HD.   General:  Well appearing. No resp difficulty HEENT: normal Neck: supple. no JVD. Carotids 2+ bilat; no bruits. No lymphadenopathy or thryomegaly appreciated. Cor: PMI nondisplaced. Regular rate & rhythm. No rubs, gallops or murmurs. Lungs: clear Abdomen: obese  soft, nontender, nondistended. No hepatosplenomegaly. No bruits or masses. Good bowel sounds. Extremities: no cyanosis, clubbing, rash, edema Neuro: alert & orientedx3, cranial nerves grossly intact. moves all 4 extremities w/o difficulty. Affect pleasant  VT strom in setting of severe NICM. VT now under control with IV amio. Given normal cath in 8/22 and low hstrop doubt this is ischemically mediated.  HF currently well controlled. Volume status maintained with HD though Scr remains quite high. Will defer to EP with regard to VT management. Can consider cMRI to look for infiltrative disease/scar burden. Will d/w Dr. Aundra Dubin.   Glori Bickers, MD  11:17 AM

## 2022-08-17 DIAGNOSIS — I472 Ventricular tachycardia, unspecified: Secondary | ICD-10-CM | POA: Diagnosis not present

## 2022-08-17 LAB — BASIC METABOLIC PANEL
Anion gap: 20 — ABNORMAL HIGH (ref 5–15)
BUN: 69 mg/dL — ABNORMAL HIGH (ref 6–20)
CO2: 22 mmol/L (ref 22–32)
Calcium: 8.6 mg/dL — ABNORMAL LOW (ref 8.9–10.3)
Chloride: 98 mmol/L (ref 98–111)
Creatinine, Ser: 14.36 mg/dL — ABNORMAL HIGH (ref 0.61–1.24)
GFR, Estimated: 4 mL/min — ABNORMAL LOW (ref >60)
Glucose, Bld: 100 mg/dL — ABNORMAL HIGH (ref 70–99)
Potassium: 4.4 mmol/L (ref 3.5–5.1)
Sodium: 140 mmol/L (ref 135–145)

## 2022-08-17 LAB — HEPATITIS B SURFACE ANTIBODY, QUANTITATIVE: Hep B S AB Quant (Post): >1000 m[IU]/mL (ref >9.9)

## 2022-08-17 LAB — MAGNESIUM: Magnesium: 2.6 mg/dL — ABNORMAL HIGH (ref 1.7–2.4)

## 2022-08-17 MED ORDER — AMIODARONE HCL 200 MG PO TABS
400.0000 mg | ORAL_TABLET | Freq: Two times a day (BID) | ORAL | Status: DC
  Administered 2022-08-17 – 2022-08-18 (×3): 400 mg via ORAL
  Filled 2022-08-17 (×3): qty 2

## 2022-08-17 MED ORDER — MIDODRINE HCL 5 MG PO TABS
10.0000 mg | ORAL_TABLET | Freq: Once | ORAL | Status: AC
  Administered 2022-08-17: 10 mg via ORAL
  Filled 2022-08-17: qty 2

## 2022-08-17 MED ORDER — ALBUMIN HUMAN 25 % IV SOLN
12.5000 g | Freq: Once | INTRAVENOUS | Status: AC
  Administered 2022-08-17: 12.5 g via INTRAVENOUS
  Filled 2022-08-17: qty 50

## 2022-08-17 NOTE — Progress Notes (Signed)
Pt refused Subcutaneous Heparin this afternoon. Educated on the importance of medication regimen adherence, but pt adamantly refused despite education attempts. Dr. Aundra Dubin notified.

## 2022-08-17 NOTE — Care Management (Signed)
  Transition of Care [facility]) Screening Note   Patient Details  Name: Victor Thompson Date of Birth: 1961-10-11   Transition of Care [facility]) CM/SW Contact:    Bethena Roys, RN Phone Number: 08/17/2022, 4:52 PM    Transition of Care Department [facility]) has reviewed the patient and no TOC needs have been identified at this time. We will continue to monitor patient advancement through interdisciplinary progression rounds. If new patient transition needs arise, please place a TOC consult.

## 2022-08-17 NOTE — Progress Notes (Cosign Needed Addendum)
Rounding Note    [name] Date of Encounter: 08/17/2022  [place] Cardiologist: Loralie Champagne, MD   Subjective   Feels better today, getting HD currently  Inpatient Medications    Scheduled Meds:  (feeding supplement) PROSource Plus  30 mL Oral BID BM   aspirin EC  81 mg Oral Daily   atorvastatin  80 mg Oral QHS   carvedilol  3.125 mg Oral BID   Chlorhexidine Gluconate Cloth  6 each Topical Daily   doxercalciferol  7 mcg Intravenous Q T,Th,Sa-HD   heparin  5,000 Units Subcutaneous Q8H   mometasone-formoterol  2 puff Inhalation BID   multivitamin  1 tablet Oral QHS   sucroferric oxyhydroxide  1,000 mg Oral TID WC   Continuous Infusions:  amiodarone 60 mg/hr (08/17/22 0600)   PRN Meds: acetaminophen, albuterol, mouth rinse   Vital Signs    Vitals:   08/17/22 0815 08/17/22 0819 08/17/22 0830 08/17/22 0845  BP:  110/76 118/86 109/81  Pulse: 72  74 74  Resp: 15  (!) 24 18  Temp:      TempSrc:      SpO2: 93%  95% 95%  Weight:      Height:        Intake/Output Summary (Last 24 hours) at 08/17/2022 0901 Last data filed at 08/17/2022 0600 Gross per 24 hour  Intake 1536.05 ml  Output --  Net 1536.05 ml      08/17/2022    7:15 AM 08/17/2022    5:00 AM 08/16/2022    7:44 AM  Last 3 Weights  Weight (lbs) 267 lb 13.7 oz 269 lb 6.4 oz 271 lb 2.7 oz  Weight (kg) 121.5 kg 122.2 kg 123 kg      Telemetry    SR 60's, infrequent PVCs, ectopy burden up some with HD - Personally Reviewed  ECG    No new EKGs - Personally Reviewed  Physical Exam   GEN: No acute distress.   Neck: No JVD Cardiac: RRR, no murmurs, rubs, or gallops.  Respiratory: CTA b/l. GI: Soft, nontender, non-distended  MS: trace edema; No deformity. Neuro:  Nonfocal  Psych: Normal affect   Labs    High Sensitivity Troponin:   Recent Labs  Lab 08/16/22 0627  TROPONINIHS 53*     Chemistry Recent Labs  Lab 08/16/22 0757 08/16/22 1004 08/17/22 0520   NA 140  138  --  140  K 4.5  4.5  --  4.4  CL 100  103  --  98  CO2 20*  --  22  GLUCOSE 92  97  --  100*  BUN 53*  61*  --  69*  CREATININE 12.77*  14.10* 13.17* 14.36*  CALCIUM 9.3  --  8.6*  MG 2.5*  --   --   PROT  --  7.3  --   ALBUMIN  --  3.3*  --   AST  --  19  --   ALT  --  13  --   ALKPHOS  --  59  --   BILITOT  --  0.7  --   GFRNONAA 4* 4* 4*  ANIONGAP 20*  --  20*    Lipids  Recent Labs  Lab 08/16/22 1004  CHOL 230*  TRIG 195*  HDL 27*  LDLCALC 164*  CHOLHDL 8.5    Hematology Recent Labs  Lab 08/16/22 0757 08/16/22 1257  WBC  --  6.7  RBC  --  3.69*  HGB 13.9 12.5*  HCT 41.0 36.1*  MCV  --  97.8  MCH  --  33.9  MCHC  --  34.6  RDW  --  16.0*  PLT  --  205   Thyroid  Recent Labs  Lab 08/16/22 1004  TSH 3.364    BNP Recent Labs  Lab 08/16/22 0627 08/16/22 1257  BNP QUANTITY NOT SUFFICIENT, UNABLE TO PERFORM TEST 399.2*    DDimer No results for input(s): "DDIMER" in the last 168 hours.   Radiology    DG Chest Port 1 View  Result Date: 08/16/2022 CLINICAL DATA:  Chest pain. EXAM: PORTABLE CHEST 1 VIEW COMPARISON:  05/25/2022 FINDINGS: 0611 hours. The cardio pericardial silhouette is enlarged. There is pulmonary vascular congestion without overt pulmonary edema. Left-sided ICD again noted. Telemetry leads overlie the chest. IMPRESSION: Enlargement of the cardiopericardial silhouette with pulmonary vascular congestion. Electronically Signed   By: Misty Stanley M.D.   On: 08/16/2022 06:23    Cardiac Studies   10/21/21: TTE  1. Left ventricular ejection fraction, by estimation, is 25 to 30%. The  left ventricle has severely decreased function. The left ventricle  demonstrates global hypokinesis. The left ventricular internal cavity size  was moderately dilated. Left  ventricular diastolic parameters are consistent with Grade I diastolic  dysfunction (impaired relaxation).   2. Right ventricular systolic function is normal. The right  ventricular  size is normal. There is normal pulmonary artery systolic pressure. The  estimated right ventricular systolic pressure is 43.1 mmHg.   3. The mitral valve is normal in structure. Mild mitral valve  regurgitation. No evidence of mitral stenosis.   4. The aortic valve is tricuspid. Aortic valve regurgitation is not  visualized. No aortic stenosis is present.   5. The inferior vena cava is normal in size with greater than 50%  respiratory variability, suggesting right atrial pressure of 3 mmHg.    07/08/21; LHC 1. Normal filling pressures and normal cardiac output.  2. No significant CAD.  Nonischemic cardiomyopathy.   Patient Profile     61 y.o. male with a hx of  hx of VF arrest (2015), CAD (PCI in 2005, unknown details), ICM,. Chronic CHF (systolic), ESRF on HD , OSA w/CPAP, HTN, HLD, recurrent angio edema admitted yesterday with VT storm.  CADwith prior PCI though cath Aug 2022 noted no obstructive disease Cardiolite 2017: Inferior and inferolateral large fixed defect suggestive of prior infarction, EF 28% Last Echo was Nov 2022, known reduced LVEF 25-30% (unchanged from 2021)     Device information BSCi S-ICD implanted  elsewhere 04/11/2014 > gen change 10/13/2020 + Hx of appropriate therapies Amiodarone started June 2023  ICD interrogation 08/16/22: Stable battery and electrode impedance 28 appropriate shocks  Assessment & Plan    VT storm Missed his amio  as it turns out for a couple weeks Reduce amio gtt to 30 Having some ectopy on HD this AM D/w RN can rebolus or resume 60 through HD this AM if needed  Lytes were OK, keep K+ 4,0 or better and mag 2.0 or better  Dr. Lovena Le has seen him this AM '30mg'$ /hr today and transition to PO this evening, '400mg'$  BID Ok to tele bed tonight if rhythm is OK Timeline looks to discharge Thursday hopefully   ESRF on HD Nephrology is on board   Chronic CHF, NICM Volume with HD HF team is aware he is here Planned for  c.MRI   CAD, this is unclear No angina No  obstructive CAD by cath Aug 2022  For questions or updates, please contact [facility] Please consult [email or web address] for contact info under        Signed, Baldwin Jamaica, PA-C  08/17/2022, 9:01 AM

## 2022-08-17 NOTE — Progress Notes (Signed)
Received patient in bed to unit.  Alert and oriented.  Informed consent signed and in chart.   Treatment initiated: 0745 Treatment completed: 1224 is when 1st system clotted off, set up to run again and tried to restart and venous access wouldn't push any longer, pt refused to be restuck and wants to try again tomorrow. [place], PA was made aware of events and request.  Patient tolerated well.   Alert, without acute distress.  Hand-off given to patient's nurse.   Access used: AVF Access issues: fought increasing VP alarms throughout and then increased TMP alarms as orders stated, "no heparin" and pt states he gets heparin at the beginning of his tx  Total UF removed: 1675m Medication(s) given: Midodrine '10mg'$  po, Albumin 25% 12.5g IVPB, Hectorol 737m IVP Post HD VS: 97.9    112/78    70    23    96% RA Post HD weight: 119.1kg   [facility]

## 2022-08-17 NOTE — Progress Notes (Signed)
Patient ID: Victor Thompson, male   DOB: 21-Nov-1961, 61 y.o.   MRN: [id number]     Advanced Heart Failure Rounding Note  PCP-Cardiologist: Loralie Champagne, MD   Subjective:    No further VT, had some PVCs. He is on amiodarone gtt 60 mg/hr.  SBP 100s-110s.   No complaints this morning, starting HD.    Objective:   Weight Range: 121.5 kg Body mass index is 41.95 kg/m.   Vital Signs:   Temp:  [97.8 F (36.6 C)-98.3 F (36.8 C)] 98 F (36.7 C) (09/12 0725) Pulse Rate:  [41-82] 73 (09/12 0800) Resp:  [14-28] 14 (09/12 0800) BP: (84-123)/(51-105) 84/70 (09/12 0800) SpO2:  [93 %-100 %] 97 % (09/12 0800) Weight:  [121.5 kg-122.2 kg] 121.5 kg (09/12 0715) Last BM Date :  (PTA)  Weight change: Filed Weights   08/16/22 0744 08/17/22 0500 08/17/22 0715  Weight: 123 kg 122.2 kg 121.5 kg    Intake/Output:   Intake/Output Summary (Last 24 hours) at 08/17/2022 0821 Last data filed at 08/17/2022 0600 Gross per 24 hour  Intake 1536.05 ml  Output --  Net 1536.05 ml      Physical Exam    General:  Well appearing. No resp difficulty HEENT: Normal Neck: Supple. JVP 8. Carotids 2+ bilat; no bruits. No lymphadenopathy or thyromegaly appreciated. Cor: PMI nondisplaced. Regular rate & rhythm. No rubs, gallops or murmurs. Lungs: Clear Abdomen: Soft, nontender, nondistended. No hepatosplenomegaly. No bruits or masses. Good bowel sounds. Extremities: No cyanosis, clubbing, rash, edema Neuro: Alert & orientedx3, cranial nerves grossly intact. moves all 4 extremities w/o difficulty. Affect pleasant   Telemetry   NSR with PVCs (personally reviewed)   Labs    CBC Recent Labs    08/16/22 0757 08/16/22 1257  WBC  --  6.7  HGB 13.9 12.5*  HCT 41.0 36.1*  MCV  --  97.8  PLT  --  476   Basic Metabolic Panel Recent Labs    08/16/22 0757 08/16/22 1004 08/17/22 0520  NA 140  138  --  140  K 4.5  4.5  --  4.4  CL 100  103  --  98  CO2 20*  --  22  GLUCOSE 92  97  --  100*   BUN 53*  61*  --  69*  CREATININE 12.77*  14.10* 13.17* 14.36*  CALCIUM 9.3  --  8.6*  MG 2.5*  --   --    Liver Function Tests Recent Labs    08/16/22 1004  AST 19  ALT 13  ALKPHOS 59  BILITOT 0.7  PROT 7.3  ALBUMIN 3.3*   No results for input(s): "LIPASE", "AMYLASE" in the last 72 hours. Cardiac Enzymes No results for input(s): "CKTOTAL", "CKMB", "CKMBINDEX", "TROPONINI" in the last 72 hours.  BNP: BNP (last 3 results) Recent Labs    05/25/22 0705 08/16/22 0627 08/16/22 1257  BNP 1,355.4* QUANTITY NOT SUFFICIENT, UNABLE TO PERFORM TEST 399.2*    ProBNP (last 3 results) No results for input(s): "PROBNP" in the last 8760 hours.   D-Dimer No results for input(s): "DDIMER" in the last 72 hours. Hemoglobin A1C No results for input(s): "HGBA1C" in the last 72 hours. Fasting Lipid Panel Recent Labs    08/16/22 1004  CHOL 230*  HDL 27*  LDLCALC 164*  TRIG 195*  CHOLHDL 8.5   Thyroid Function Tests Recent Labs    08/16/22 1004  TSH 3.364    Other results:   Imaging  No results found.   Medications:     Scheduled Medications:  (feeding supplement) PROSource Plus  30 mL Oral BID BM   aspirin EC  81 mg Oral Daily   atorvastatin  80 mg Oral QHS   carvedilol  3.125 mg Oral BID   Chlorhexidine Gluconate Cloth  6 each Topical Daily   doxercalciferol  7 mcg Intravenous Q T,Th,Sa-HD   heparin  5,000 Units Subcutaneous Q8H   isosorbide mononitrate  60 mg Oral Daily   mometasone-formoterol  2 puff Inhalation BID   multivitamin  1 tablet Oral QHS   sucroferric oxyhydroxide  1,000 mg Oral TID WC    Infusions:  amiodarone 60 mg/hr (08/17/22 0600)    PRN Medications: acetaminophen, albuterol, mouth rinse   Assessment/Plan   1. VT storm: VT storm episode in 6/23 and amiodarone started. 28 shocks on 9/11 via [place].  Nonischemic cardiomyopathy and HS-TnI not significantly elevated with no chest pain, doubt ACS.  On discussion  with patient, he has likely been off amiodarone for several weeks (had stopped all meds).  - Reload amiodarone per EP.  - Apparently, he has been off Coreg at home.  Restarted at 3.125 mg bid.  - Cardiac MRI to look for infiltrative disease/sarcoidosis. Device is compatible.  2. Acute on chronic systolic CHF: Suspect primarily nonischemic cardiomyopathy.  Echo in 2/22 with EF 25-30%, moderate LV dilation, mild LVH, normal RV size and systolic function, mild MR. Coronary angiography was done in 8/22, no significant CAD.  He has a [employer] officer, political party subcutaneous ICD. Echo 11/22 EF 25-30%, moderate LV dilation, normal RV.  He is not significantly volume overloaded on exam, stable NYHA II symptoms. He has been getting HD regularly without missing sessions.  BNP 399, not particularly elevated in setting of ESRD.  - Continue Coreg at 3.125 mg bid - HD for volume management, to get today.  - Probably has not been taking hydralazine/Imdur at home.  Hold for now and see if we can restart based on BP.  - ECG with IVCD 150 msec, thought not to be good candidate for CRT upgrade (not true LBBB) - Cardiac MRI to look for infiltrative disease/sarcoidosis.  3. CAD: PCI in 2005, unsure what vessel.  Coronary angiography in 8/22 showed no significant CAD and also does not appear to show prior stent so h/o CAD is questionable. No chest pain.  HS-TnI not significantly elevated, doubt ACS.  - LDL high, has been off atorvastatin => restart at 80 mg daily.  - Continue ASA 81 daily.  4. ESRD: Since 2/22.  Turned down for renal transplant due to low EF.  - Avoid hypotension - HD today.  5. OSA: Continue CPAP.  6. Angioedema: Recurrent episodes.  He is not on a med that commonly causes angioedema.  No recent episodes.    Length of Stay: 1  Loralie Champagne, MD  08/17/2022, 8:21 AM  Advanced Heart Failure Team Pager [phone] (M-F; 7a - 5p)  Please contact [facility] for night-coverage after hours (5p -7a ) and weekends on  [email or web address]

## 2022-08-17 NOTE — Procedures (Signed)
I was present at this dialysis session. I have reviewed the session itself and made appropriate changes.   Seen on HD.  Some IDH rec midodrine/albumin with good effect.  Using AVF.  3K bath, 3.1L UF goal. Pt tolerating well.    No sustained ectopy. Remains on amio gtt.    Filed Weights   08/16/22 0744 08/17/22 0500 08/17/22 0715  Weight: 123 kg 122.2 kg 121.5 kg    Recent Labs  Lab 08/17/22 0520  NA 140  K 4.4  CL 98  CO2 22  GLUCOSE 100*  BUN 69*  CREATININE 14.36*  CALCIUM 8.6*    Recent Labs  Lab 08/16/22 0757 08/16/22 1257  WBC  --  6.7  HGB 13.9 12.5*  HCT 41.0 36.1*  MCV  --  97.8  PLT  --  205    Scheduled Meds:  (feeding supplement) PROSource Plus  30 mL Oral BID BM   aspirin EC  81 mg Oral Daily   atorvastatin  80 mg Oral QHS   carvedilol  3.125 mg Oral BID   Chlorhexidine Gluconate Cloth  6 each Topical Daily   doxercalciferol  7 mcg Intravenous Q T,Th,Sa-HD   heparin  5,000 Units Subcutaneous Q8H   mometasone-formoterol  2 puff Inhalation BID   multivitamin  1 tablet Oral QHS   sucroferric oxyhydroxide  1,000 mg Oral TID WC   Continuous Infusions:  amiodarone 60 mg/hr (08/17/22 0600)   PRN Meds:.acetaminophen, albuterol, mouth rinse   Pearson Grippe  MD 08/17/2022, 9:03 AM

## 2022-08-17 NOTE — Plan of Care (Signed)
  Problem: Education: Goal: Understanding of cardiac disease, CV risk reduction, and recovery process will improve Outcome: Progressing Goal: Individualized Educational Video(s) Outcome: Progressing   Problem: Activity: Goal: Ability to tolerate increased activity will improve Outcome: Progressing   Problem: Cardiac: Goal: Ability to achieve and maintain adequate cardiovascular perfusion will improve Outcome: Progressing   Problem: Health Behavior/Discharge Planning: Goal: Ability to safely manage health-related needs after discharge will improve Outcome: Progressing   Problem: Education: Goal: Knowledge of General Education information will improve Description: Including pain rating scale, medication(s)/side effects and non-pharmacologic comfort measures Outcome: Progressing   Problem: Health Behavior/Discharge Planning: Goal: Ability to manage health-related needs will improve Outcome: Progressing   Problem: Clinical Measurements: Goal: Ability to maintain clinical measurements within normal limits will improve Outcome: Progressing Goal: Will remain free from infection Outcome: Progressing Goal: Diagnostic test results will improve Outcome: Progressing Goal: Respiratory complications will improve Outcome: Progressing Goal: Cardiovascular complication will be avoided Outcome: Progressing   Problem: Activity: Goal: Risk for activity intolerance will decrease Outcome: Progressing   Problem: Nutrition: Goal: Adequate nutrition will be maintained Outcome: Progressing   Problem: Coping: Goal: Level of anxiety will decrease Outcome: Progressing   Problem: Elimination: Goal: Will not experience complications related to bowel motility Outcome: Progressing Goal: Will not experience complications related to urinary retention Outcome: Progressing   Problem: Pain Managment: Goal: General experience of comfort will improve Outcome: Progressing   Problem:  Safety: Goal: Ability to remain free from injury will improve Outcome: Progressing

## 2022-08-18 ENCOUNTER — Encounter (HOSPITAL_COMMUNITY): Payer: Self-pay

## 2022-08-18 ENCOUNTER — Ambulatory Visit: Payer: Self-pay

## 2022-08-18 ENCOUNTER — Inpatient Hospital Stay (HOSPITAL_COMMUNITY): Payer: Medicare Other

## 2022-08-18 DIAGNOSIS — I472 Ventricular tachycardia, unspecified: Secondary | ICD-10-CM | POA: Diagnosis not present

## 2022-08-18 LAB — BASIC METABOLIC PANEL
Anion gap: 20 — ABNORMAL HIGH (ref 5–15)
BUN: 62 mg/dL — ABNORMAL HIGH (ref 6–20)
CO2: 24 mmol/L (ref 22–32)
Calcium: 9 mg/dL (ref 8.9–10.3)
Chloride: 93 mmol/L — ABNORMAL LOW (ref 98–111)
Creatinine, Ser: 13.34 mg/dL — ABNORMAL HIGH (ref 0.61–1.24)
GFR, Estimated: 4 mL/min — ABNORMAL LOW (ref >60)
Glucose, Bld: 103 mg/dL — ABNORMAL HIGH (ref 70–99)
Potassium: 4.7 mmol/L (ref 3.5–5.1)
Sodium: 137 mmol/L (ref 135–145)

## 2022-08-18 LAB — MAGNESIUM: Magnesium: 2.3 mg/dL (ref 1.7–2.4)

## 2022-08-18 MED ORDER — MELATONIN 5 MG PO TABS
10.0000 mg | ORAL_TABLET | Freq: Every evening | ORAL | Status: DC | PRN
  Administered 2022-08-18 – 2022-08-20 (×3): 10 mg via ORAL
  Filled 2022-08-18 (×3): qty 2

## 2022-08-18 MED ORDER — DOCUSATE SODIUM 100 MG PO CAPS
100.0000 mg | ORAL_CAPSULE | Freq: Two times a day (BID) | ORAL | Status: DC
  Administered 2022-08-18 – 2022-08-20 (×6): 100 mg via ORAL
  Filled 2022-08-18 (×6): qty 1

## 2022-08-18 MED ORDER — AMIODARONE IV BOLUS ONLY 150 MG/100ML
150.0000 mg | Freq: Once | INTRAVENOUS | Status: AC
  Administered 2022-08-19: 150 mg via INTRAVENOUS
  Filled 2022-08-18: qty 100

## 2022-08-18 NOTE — Progress Notes (Addendum)
Patient ID: Victor Thompson, male   DOB: Apr 15, 1961, 61 y.o.   MRN: [id number]     Advanced Heart Failure Rounding Note  PCP-Cardiologist: Loralie Champagne, MD   Subjective:    Completed 24 hr amio gtt. Now on PO 400 mg bid. No further VT   K 4.4 Mg 2.6  Had HD yesterday but treatment session terminated early after system clotted off, pt refused SQ heparin. Only 1.6L volume removal. Planning HD again today.  Awaiting cMRI.   Sitting up on side of bed. No complaints. Denies CP. No dyspnea.   Objective:   Weight Range: 119.1 kg Body mass index is 41.12 kg/m.   Vital Signs:   Temp:  [97.9 F (36.6 C)-98.8 F (37.1 C)] 98.3 F (36.8 C) (09/13 0700) Pulse Rate:  [57-80] 79 (09/13 0300) Resp:  [11-29] 20 (09/13 0300) BP: (84-128)/(70-100) 98/72 (09/13 0300) SpO2:  [88 %-99 %] 97 % (09/13 0300) Weight:  [119.1 kg] 119.1 kg (09/12 1100) Last BM Date : 08/17/22  Weight change: Filed Weights   08/17/22 0500 08/17/22 0715 08/17/22 1100  Weight: 122.2 kg 121.5 kg 119.1 kg    Intake/Output:   Intake/Output Summary (Last 24 hours) at 08/18/2022 0719 Last data filed at 08/18/2022 0709 Gross per 24 hour  Intake 885.79 ml  Output 1600 ml  Net -714.21 ml      Physical Exam    General:  Well appearing, obese, sitting up on side of bed. No resp difficulty HEENT: Normal Neck: Supple. JVP 7 cm. Carotids 2+ bilat; no bruits. No lymphadenopathy or thyromegaly appreciated. Cor: PMI nondisplaced. Regular rate & rhythm. No rubs, gallops or murmurs. Lungs: Clear Abdomen: Soft, nontender, nondistended. No hepatosplenomegaly. No bruits or masses. Good bowel sounds. Extremities: No cyanosis, clubbing, rash, edema Neuro: Alert & orientedx3, cranial nerves grossly intact. moves all 4 extremities w/o difficulty. Affect pleasant   Telemetry   NSR with occasional PVCs, 80s. No further VT (personally reviewed)   Labs    CBC Recent Labs    08/16/22 0757 08/16/22 1257  WBC  --  6.7   HGB 13.9 12.5*  HCT 41.0 36.1*  MCV  --  97.8  PLT  --  751   Basic Metabolic Panel Recent Labs    08/16/22 0757 08/16/22 1004 08/17/22 0520  NA 140  138  --  140  K 4.5  4.5  --  4.4  CL 100  103  --  98  CO2 20*  --  22  GLUCOSE 92  97  --  100*  BUN 53*  61*  --  69*  CREATININE 12.77*  14.10* 13.17* 14.36*  CALCIUM 9.3  --  8.6*  MG 2.5*  --  2.6*   Liver Function Tests Recent Labs    08/16/22 1004  AST 19  ALT 13  ALKPHOS 59  BILITOT 0.7  PROT 7.3  ALBUMIN 3.3*   No results for input(s): "LIPASE", "AMYLASE" in the last 72 hours. Cardiac Enzymes No results for input(s): "CKTOTAL", "CKMB", "CKMBINDEX", "TROPONINI" in the last 72 hours.  BNP: BNP (last 3 results) Recent Labs    05/25/22 0705 08/16/22 0627 08/16/22 1257  BNP 1,355.4* QUANTITY NOT SUFFICIENT, UNABLE TO PERFORM TEST 399.2*    ProBNP (last 3 results) No results for input(s): "PROBNP" in the last 8760 hours.   D-Dimer No results for input(s): "DDIMER" in the last 72 hours. Hemoglobin A1C No results for input(s): "HGBA1C" in the last 72 hours. Fasting Lipid Panel Recent  Labs    08/16/22 1004  CHOL 230*  HDL 27*  LDLCALC 164*  TRIG 195*  CHOLHDL 8.5   Thyroid Function Tests Recent Labs    08/16/22 1004  TSH 3.364    Other results:   Imaging    No results found.   Medications:     Scheduled Medications:  (feeding supplement) PROSource Plus  30 mL Oral BID BM   amiodarone  400 mg Oral BID   aspirin EC  81 mg Oral Daily   atorvastatin  80 mg Oral QHS   carvedilol  3.125 mg Oral BID   Chlorhexidine Gluconate Cloth  6 each Topical Daily   doxercalciferol  7 mcg Intravenous Q T,Th,Sa-HD   heparin  5,000 Units Subcutaneous Q8H   mometasone-formoterol  2 puff Inhalation BID   multivitamin  1 tablet Oral QHS   sucroferric oxyhydroxide  1,000 mg Oral TID WC    Infusions:    PRN Medications: acetaminophen, albuterol, mouth rinse   Assessment/Plan   1.  VT storm: VT storm episode in 6/23 and amiodarone started. 28 shocks on 9/11 via [place].  Nonischemic cardiomyopathy and HS-TnI not significantly elevated with no chest pain, doubt ACS.  On discussion with patient, he has likely been off amiodarone for several weeks (had stopped all meds).  - Reload amiodarone per EP. Completed 24 hr gtt, now on PO 400 mg bid  - Apparently, he has been off Coreg at home.  Restarted at 3.125 mg bid.  - no further VT on tele - Cardiac MRI to look for infiltrative disease/sarcoidosis. Device is compatible.  - Keep K > 4.0 and Mg > 2.0  2. Acute on chronic systolic CHF: Suspect primarily nonischemic cardiomyopathy.  Echo in 2/22 with EF 25-30%, moderate LV dilation, mild LVH, normal RV size and systolic function, mild MR. Coronary angiography was done in 8/22, no significant CAD.  He has a Research officer, political party subcutaneous ICD. Echo 11/22 EF 25-30%, moderate LV dilation, normal RV.  He is not significantly volume overloaded on exam, stable NYHA II symptoms. He has been getting HD regularly without missing sessions.  BNP 399, not particularly elevated in setting of ESRD.  - Continue Coreg at 3.125 mg bid - HD for volume management, to get today.  - Probably has not been taking hydralazine/Imdur at home.  Hold for now and see if we can restart based on BP.  - ECG with IVCD 150 msec, thought not to be good candidate for CRT upgrade (not true LBBB) - Cardiac MRI to look for infiltrative disease/sarcoidosis.  3. CAD: PCI in 2005, unsure what vessel.  Coronary angiography in 8/22 showed no significant CAD and also does not appear to show prior stent so h/o CAD is questionable. No chest pain.  HS-TnI not significantly elevated, doubt ACS.  - LDL high, had been off atorvastatin => restarted at 80 mg daily.  - Continue ASA 81 daily.  4. ESRD: Since 2/22.  Turned down for renal transplant due to low EF.  - Avoid hypotension - HD per nephrology 5. OSA: Continue CPAP.  6.  Angioedema: Recurrent episodes.  He is not on a med that commonly causes angioedema.  No recent episodes.    Length of Stay: 2  Lyda Jester, PA-C  08/18/2022, 7:19 AM  Advanced Heart Failure Team Pager [phone] (M-F; 7a - 5p)  Please contact [facility] for night-coverage after hours (5p -7a ) and weekends on [email or web address]   Patient seen with PA, agree  with the above note.   No complaints this morning.  HD terminated early yesterday after system clotted off.    He has had some PVCs but no VT.  Now on po amiodarone.   General: NAD Neck: Thick, JVP difficult, no thyromegaly or thyroid nodule.  Lungs: Clear to auscultation bilaterally with normal respiratory effort. CV: Nondisplaced PMI.  Heart regular S1/S2, no S3/S4, no murmur.  No peripheral edema.   Abdomen: Soft, nontender, no hepatosplenomegaly, no distention.  Skin: Intact without lesions or rashes.  Neurologic: Alert and oriented x 3.  Psych: Normal affect. Extremities: No clubbing or cyanosis.  HEENT: Normal.   He will be going for HD again today as did not complete yesterday.   SBP 90s-100s.  Can continue Coreg but will not start hydralazine/Imdur.   Now on amiodarone 400 mg bid, will continue re-load as he was not taking at home.    Plan cMRI today to look for infiltrative disease/sarcoidosis.  Device is compatible but position may cause too much artifact for an interpretable study.   Loralie Champagne 08/18/2022 8:31 AM

## 2022-08-18 NOTE — Progress Notes (Addendum)
Rounding Note    [name] Date of Encounter: 08/18/2022  [place] Cardiologist: Victor Champagne, MD   Subjective   No new worries, hopes to go home tomorrow, but a little anxious about more shocks  Inpatient Medications    Scheduled Meds:  (feeding supplement) PROSource Plus  30 mL Oral BID BM   amiodarone  400 mg Oral BID   aspirin EC  81 mg Oral Daily   atorvastatin  80 mg Oral QHS   carvedilol  3.125 mg Oral BID   Chlorhexidine Gluconate Cloth  6 each Topical Daily   doxercalciferol  7 mcg Intravenous Q T,Th,Sa-HD   heparin  5,000 Units Subcutaneous Q8H   mometasone-formoterol  2 puff Inhalation BID   multivitamin  1 tablet Oral QHS   sucroferric oxyhydroxide  1,000 mg Oral TID WC   Continuous Infusions:   PRN Meds: acetaminophen, albuterol, mouth rinse   Vital Signs    Vitals:   08/17/22 2205 08/17/22 2300 08/18/22 0300 08/18/22 0700  BP:  94/82 98/72   Pulse: 80 71 79   Resp: '20 11 20   '$ Temp:  98.7 F (37.1 C) 98.7 F (37.1 C) 98.3 F (36.8 C)  TempSrc:  Oral Axillary Oral  SpO2: 98% 94% 97%   Weight:      Height:        Intake/Output Summary (Last 24 hours) at 08/18/2022 0851 Last data filed at 08/18/2022 0709 Gross per 24 hour  Intake 852.41 ml  Output 1600 ml  Net -747.59 ml      08/17/2022   11:00 AM 08/17/2022    7:15 AM 08/17/2022    5:00 AM  Last 3 Weights  Weight (lbs) 262 lb 9.1 oz 267 lb 13.7 oz 269 lb 6.4 oz  Weight (kg) 119.1 kg 121.5 kg 122.2 kg      Telemetry    SR 60's, infrequent PVCs,rare NSVT - Personally Reviewed  ECG    No new EKGs - Personally Reviewed  Physical Exam   Exam remains unchanged GEN: No acute distress.   Neck: No JVD Cardiac: RRR, no murmurs, rubs, or gallops.  Respiratory: CTA b/l. GI: Soft, nontender, non-distended  MS: trace edema; No deformity. Neuro:  Nonfocal  Psych: Normal affect   Labs    High Sensitivity Troponin:   Recent Labs  Lab 08/16/22 0627   TROPONINIHS 53*     Chemistry Recent Labs  Lab 08/16/22 0757 08/16/22 1004 08/17/22 0520 08/18/22 0554  NA 140  138  --  140 137  K 4.5  4.5  --  4.4 4.7  CL 100  103  --  98 93*  CO2 20*  --  22 24  GLUCOSE 92  97  --  100* 103*  BUN 53*  61*  --  69* 62*  CREATININE 12.77*  14.10* 13.17* 14.36* 13.34*  CALCIUM 9.3  --  8.6* 9.0  MG 2.5*  --  2.6* 2.3  PROT  --  7.3  --   --   ALBUMIN  --  3.3*  --   --   AST  --  19  --   --   ALT  --  13  --   --   ALKPHOS  --  59  --   --   BILITOT  --  0.7  --   --   GFRNONAA 4* 4* 4* 4*  ANIONGAP 20*  --  20* 20*  Lipids  Recent Labs  Lab 08/16/22 1004  CHOL 230*  TRIG 195*  HDL 27*  LDLCALC 164*  CHOLHDL 8.5    Hematology Recent Labs  Lab 08/16/22 0757 08/16/22 1257  WBC  --  6.7  RBC  --  3.69*  HGB 13.9 12.5*  HCT 41.0 36.1*  MCV  --  97.8  MCH  --  33.9  MCHC  --  34.6  RDW  --  16.0*  PLT  --  205   Thyroid  Recent Labs  Lab 08/16/22 1004  TSH 3.364    BNP Recent Labs  Lab 08/16/22 0627 08/16/22 1257  BNP QUANTITY NOT SUFFICIENT, UNABLE TO PERFORM TEST 399.2*    DDimer No results for input(s): "DDIMER" in the last 168 hours.   Radiology    No results found.  Cardiac Studies   10/21/21: TTE  1. Left ventricular ejection fraction, by estimation, is 25 to 30%. The  left ventricle has severely decreased function. The left ventricle  demonstrates global hypokinesis. The left ventricular internal cavity size  was moderately dilated. Left  ventricular diastolic parameters are consistent with Grade I diastolic  dysfunction (impaired relaxation).   2. Right ventricular systolic function is normal. The right ventricular  size is normal. There is normal pulmonary artery systolic pressure. The  estimated right ventricular systolic pressure is 26.9 mmHg.   3. The mitral valve is normal in structure. Mild mitral valve  regurgitation. No evidence of mitral stenosis.   4. The aortic valve is  tricuspid. Aortic valve regurgitation is not  visualized. No aortic stenosis is present.   5. The inferior vena cava is normal in size with greater than 50%  respiratory variability, suggesting right atrial pressure of 3 mmHg.    07/08/21; LHC 1. Normal filling pressures and normal cardiac output.  2. No significant CAD.  Nonischemic cardiomyopathy.   Patient Profile     61 y.o. male with a hx of  hx of VF arrest (2015), CAD (PCI in 2005, unknown details), ICM,. Chronic CHF (systolic), ESRF on HD , OSA w/CPAP, HTN, HLD, recurrent angio edema admitted yesterday with VT storm.  CADwith prior PCI though cath Aug 2022 noted no obstructive disease Cardiolite 2017: Inferior and inferolateral large fixed defect suggestive of prior infarction, EF 28% Last Echo was Nov 2022, known reduced LVEF 25-30% (unchanged from 2021)     Device information BSCi S-ICD implanted  elsewhere 04/11/2014 > gen change 10/13/2020 + Hx of appropriate therapies Amiodarone started June 2023  ICD interrogation 08/16/22: Stable battery and electrode impedance 28 appropriate shocks  Assessment & Plan    VT storm Missed his amio  as it turns out for a couple weeks Transitioned to PO amiodarone last night Tele remains OK, has some NSVT but rare   Lytes are OK  Looking to perhaps discharge tomorrow, pending rhythm stability on PO amiodarone   ESRF on HD Nephrology is on board Pt was unhappy with HD yesterday, Victor Thompson defer to nephrology team   Chronic CHF, NICM Volume with HD HF team is on board Planned for c.MRI if able to get reasonable images Device system is MRI condictional   CAD, this is unclear No angina No obstructive CAD by cath Aug 2022  For questions or updates, please contact [place] Please consult [email or web address] for contact info under        Signed, Victor Jamaica, PA-C  08/18/2022, 8:51 AM     I have seen and examined  this patient with Victor Thompson.  Agree with above, note  added to reflect my findings.  Currently feeling well.  No further ventricular arrhythmias.  Tolerated dialysis well yesterday.  GEN: Well nourished, well developed, in no acute distress  HEENT: normal  Neck: no JVD, carotid bruits, or masses Cardiac: RRR; no murmurs, rubs, or gallops,no edema  Respiratory:  clear to auscultation bilaterally, normal work of breathing GI: soft, nontender, nondistended, + BS MS: no deformity or atrophy  Skin: warm and dry, device site well healed Neuro:  Strength and sensation are intact Psych: euthymic mood, full affect   Ventricular tachycardia: Was previously on IV amiodarone.  Has been switched to p.o. amiodarone.  No further ventricular arrhythmias.  Apparently had not taken his amiodarone for weeks to months prior to his admission.  We Alton Tremblay continue with current load.  Potential discharge tomorrow. End-stage renal disease: Patient on dialysis.  Plan per nephrology. Chronic systolic heart failure: Due to nonischemic cardiomyopathy.  Volume managed by dialysis.  Plan for MRI per heart failure cardiology.   Exavier Lina M. Cordarrius Coad MD 08/18/2022 9:13 AM

## 2022-08-18 NOTE — Progress Notes (Signed)
Placed patient on CPAP for the night with pressure set at 10cm.   

## 2022-08-18 NOTE — Progress Notes (Signed)
Mobility Specialist Progress Note    08/18/22 1143  Mobility  Activity Ambulated independently in hallway  Level of Assistance Standby assist, set-up cues, supervision of patient - no hands on  Assistive Device None  Distance Ambulated (ft) 420 ft  Activity Response Tolerated well  $Mobility charge 1 Mobility   Pre-Mobility: 85 HR, 94% SpO2 During Mobility: 95 HR, 98% SpO2 Post-Mobility: 86 HR, 95% SpO2  Pt received in bed and agreeable. No complaints on walk. Returned to sitting EOB with call bell in reach.    Hildred Alamin Mobility Specialist

## 2022-08-18 NOTE — Progress Notes (Signed)
Pt having lots of ectopy, PVC's, VT. HR as high as 150.  Pt states that he can feel his heart fluttering.  VSS, bp 142/79.  Denies being shocked. EKG done.  RR Doristine Johns called.  RR to bedside.  No intervention done.  Dr. Marcelle Smiling paged, findings relayed.  Orders received.  Amio bolus to be given.  Will continue to monitor heart rate and rhythms.

## 2022-08-18 NOTE — Progress Notes (Signed)
Patient to MRI from 2 central . Patient has Environmental education officer. Patient has recently experience episodes of [facility] with shocks. Patient in NSR at this time. Spoke with Joey-Rep. S-ICD off at this time, MRI protection on. Clarise Cruz RN to monitor patient during MRI. This RN will re-program once scan is completed.

## 2022-08-18 NOTE — Progress Notes (Signed)
Admit: 08/16/2022 LOS: 2  25M ESRD chronic HFrEF with VTAch Storm  Subjective:  HD yesterday, 1.6L, answered questions about differences compared to outpt No further VT   09/12 0701 - 09/13 0700 In: 885.8 [P.O.:580; I.V.:255.8; IV Piggyback:50] Out: 1600   Filed Weights   08/17/22 0500 08/17/22 0715 08/17/22 1100  Weight: 122.2 kg 121.5 kg 119.1 kg    Scheduled Meds:  (feeding supplement) PROSource Plus  30 mL Oral BID BM   amiodarone  400 mg Oral BID   aspirin EC  81 mg Oral Daily   atorvastatin  80 mg Oral QHS   carvedilol  3.125 mg Oral BID   Chlorhexidine Gluconate Cloth  6 each Topical Daily   doxercalciferol  7 mcg Intravenous Q T,Th,Sa-HD   heparin  5,000 Units Subcutaneous Q8H   mometasone-formoterol  2 puff Inhalation BID   multivitamin  1 tablet Oral QHS   sucroferric oxyhydroxide  1,000 mg Oral TID WC   Continuous Infusions: PRN Meds:.acetaminophen, albuterol, mouth rinse  Current Labs: reviewed    Physical Exam:  Blood pressure 98/72, pulse 79, temperature 98.3 F (36.8 C), temperature source Oral, resp. rate 20, height '5\' 7"'$  (1.702 m), weight 119.1 kg, SpO2 97 %. NAD RRR CTAB RUE AVF +B/T No LEE S/nt/nd  Dialysis Orders:  TTS at [facility] 4:15hr, 450/A1.5, EDW 116.4kg (usually leaves ~117kg), 2K/2Ca, UFP #4, AVF, heparin 3000 unit bolus - Hectoral 90mg IV q HD - no ESA, Hgb > 11  A Assessment/Plan:  VT storm: reloading on amio, no further events per EP/AHF  ESRD: On schedule, HD tomorrow  Hypertension/volume BP often drops with dialysis. UF as tolerated with next HD.  Anemia: Hgb remains > 11, no ESA needed.  Metabolic bone disease: Ca ok, Continue binders + VDRA.  Nutrition:  Alb low, will add protein supplements.  CAD/HFrEF    RPearson GrippeMD 08/18/2022, 10:39 AM  Recent Labs  Lab 08/16/22 0757 08/16/22 1004 08/17/22 0520 08/18/22 0554  NA 140  138  --  140 137  K 4.5  4.5  --  4.4 4.7  CL 100  103  --  98 93*  CO2 20*  --  22 24   GLUCOSE 92  97  --  100* 103*  BUN 53*  61*  --  69* 62*  CREATININE 12.77*  14.10* 13.17* 14.36* 13.34*  CALCIUM 9.3  --  8.6* 9.0   Recent Labs  Lab 08/16/22 0757 08/16/22 1257  WBC  --  6.7  HGB 13.9 12.5*  HCT 41.0 36.1*  MCV  --  97.8  PLT  --  205

## 2022-08-18 NOTE — Plan of Care (Signed)

## 2022-08-18 NOTE — Patient Outreach (Signed)
  Care Coordination   08/18/2022 Name: BREKKEN BEACH MRN: [id number] DOB: 01-30-1961   Care Coordination Outreach Attempts:  An unsuccessful telephone outreach was attempted for a scheduled appointment today.  Follow Up Plan:  Additional outreach attempts will be made to offer the patient care coordination information and services.   Encounter Outcome:  Pt. Request to Call Back  Care Coordination Interventions Activated:  No   Care Coordination Interventions:  No, not indicated    Barb Merino, RN, BSN, CCM Care Management Coordinator [place] [facility] Direct Phone: [phone]

## 2022-08-19 ENCOUNTER — Telehealth: Payer: Self-pay

## 2022-08-19 DIAGNOSIS — N186 End stage renal disease: Secondary | ICD-10-CM

## 2022-08-19 DIAGNOSIS — I2510 Atherosclerotic heart disease of native coronary artery without angina pectoris: Secondary | ICD-10-CM

## 2022-08-19 LAB — BASIC METABOLIC PANEL
Anion gap: 16 — ABNORMAL HIGH (ref 5–15)
BUN: 82 mg/dL — ABNORMAL HIGH (ref 6–20)
CO2: 25 mmol/L (ref 22–32)
Calcium: 8.7 mg/dL — ABNORMAL LOW (ref 8.9–10.3)
Chloride: 98 mmol/L (ref 98–111)
Creatinine, Ser: 15.84 mg/dL — ABNORMAL HIGH (ref 0.61–1.24)
GFR, Estimated: 3 mL/min — ABNORMAL LOW (ref >60)
Glucose, Bld: 108 mg/dL — ABNORMAL HIGH (ref 70–99)
Potassium: 4.6 mmol/L (ref 3.5–5.1)
Sodium: 139 mmol/L (ref 135–145)

## 2022-08-19 LAB — CBC
HCT: 30.5 % — ABNORMAL LOW (ref 39.0–52.0)
Hemoglobin: 10.5 g/dL — ABNORMAL LOW (ref 13.0–17.0)
MCH: 33.1 pg (ref 26.0–34.0)
MCHC: 34.4 g/dL (ref 30.0–36.0)
MCV: 96.2 fL (ref 80.0–100.0)
Platelets: 171 10*3/uL (ref 150–400)
RBC: 3.17 MIL/uL — ABNORMAL LOW (ref 4.22–5.81)
RDW: 15.9 % — ABNORMAL HIGH (ref 11.5–15.5)
WBC: 6.3 10*3/uL (ref 4.0–10.5)
nRBC: 0 % (ref 0.0–0.2)

## 2022-08-19 LAB — MAGNESIUM: Magnesium: 2.4 mg/dL (ref 1.7–2.4)

## 2022-08-19 MED ORDER — HEPARIN SODIUM (PORCINE) 1000 UNIT/ML IJ SOLN
3000.0000 [IU] | Freq: Once | INTRAMUSCULAR | Status: AC
  Administered 2022-08-19: 3000 [IU] via INTRAVENOUS
  Filled 2022-08-19: qty 3

## 2022-08-19 MED ORDER — PENTAFLUOROPROP-TETRAFLUOROETH EX AERO
INHALATION_SPRAY | CUTANEOUS | Status: AC
  Administered 2022-08-19: 30
  Filled 2022-08-19: qty 30

## 2022-08-19 MED ORDER — HEPARIN SODIUM (PORCINE) 1000 UNIT/ML IJ SOLN
INTRAMUSCULAR | Status: AC
  Filled 2022-08-19: qty 3

## 2022-08-19 MED ORDER — AMIODARONE HCL IN DEXTROSE 360-4.14 MG/200ML-% IV SOLN
60.0000 mg/h | INTRAVENOUS | Status: AC
  Administered 2022-08-19: 60 mg/h via INTRAVENOUS
  Filled 2022-08-19 (×2): qty 200

## 2022-08-19 MED ORDER — CARVEDILOL 6.25 MG PO TABS
6.2500 mg | ORAL_TABLET | Freq: Two times a day (BID) | ORAL | Status: DC
  Administered 2022-08-19 – 2022-08-21 (×5): 6.25 mg via ORAL
  Filled 2022-08-19 (×5): qty 1

## 2022-08-19 MED ORDER — MIDODRINE HCL 5 MG PO TABS
ORAL_TABLET | ORAL | Status: AC
  Administered 2022-08-19: 10 mg
  Filled 2022-08-19: qty 2

## 2022-08-19 MED ORDER — AMIODARONE HCL IN DEXTROSE 360-4.14 MG/200ML-% IV SOLN
30.0000 mg/h | INTRAVENOUS | Status: DC
  Administered 2022-08-19 – 2022-08-20 (×2): 30 mg/h via INTRAVENOUS
  Filled 2022-08-19 (×2): qty 200

## 2022-08-19 MED ORDER — HEPARIN SODIUM (PORCINE) 1000 UNIT/ML DIALYSIS: 3000.0000 [IU] | INTRAMUSCULAR | Status: DC | PRN

## 2022-08-19 NOTE — Progress Notes (Signed)
Patient placed on CPAP at this time. Patient resting comfortably.

## 2022-08-19 NOTE — Telephone Encounter (Signed)
9/18 hospital colonoscopy cancelled.   I called and left patient a detailed vm with Dr. Doyne Keel recommendations as outlined below. I advised patient to call back if he had any questions or concerns.

## 2022-08-19 NOTE — Progress Notes (Addendum)
Rounding Note    [name] Date of Encounter: 08/19/2022  [place] Cardiologist: Loralie Champagne, MD   Subjective   Resting comfortably, getting HD, easily woken, felt palpitations last night  Inpatient Medications    Scheduled Meds:  (feeding supplement) PROSource Plus  30 mL Oral BID BM   aspirin EC  81 mg Oral Daily   atorvastatin  80 mg Oral QHS   carvedilol  6.25 mg Oral BID   Chlorhexidine Gluconate Cloth  6 each Topical Daily   docusate sodium  100 mg Oral BID   doxercalciferol  7 mcg Intravenous Q T,Th,Sa-HD   heparin  5,000 Units Subcutaneous Q8H   heparin sodium (porcine)       mometasone-formoterol  2 puff Inhalation BID   multivitamin  1 tablet Oral QHS   sucroferric oxyhydroxide  1,000 mg Oral TID WC   Continuous Infusions:  amiodarone 60 mg/hr (08/19/22 0842)   amiodarone      PRN Meds: acetaminophen, albuterol, heparin, heparin sodium (porcine), melatonin, mouth rinse   Vital Signs    Vitals:   08/19/22 0800 08/19/22 0830 08/19/22 0900 08/19/22 0930  BP: 124/87 105/64 118/76 111/77  Pulse: 77 (!) 28 69 69  Resp: '19 18 19 17  '$ Temp:      TempSrc:      SpO2: 98% 97% 97% 99%  Weight:      Height:        Intake/Output Summary (Last 24 hours) at 08/19/2022 0941 Last data filed at 08/19/2022 0543 Gross per 24 hour  Intake 91.69 ml  Output --  Net 91.69 ml      08/19/2022    7:20 AM 08/17/2022   11:00 AM 08/17/2022    7:15 AM  Last 3 Weights  Weight (lbs) 277 lb 9 oz 262 lb 9.1 oz 267 lb 13.7 oz  Weight (kg) 125.9 kg 119.1 kg 121.5 kg      Telemetry    SR 60's, infrequent PVCs,rare NSVT - Personally Reviewed  ECG    No new EKGs - Personally Reviewed  Physical Exam   Exam remains unchanged GEN: No acute distress.   Neck: No JVD Cardiac: RRR, no murmurs, rubs, or gallops.  Respiratory: CTA b/l. GI: Soft, nontender, non-distended  MS: trace edema; No deformity. Neuro:  Nonfocal  Psych: Normal affect    Labs    High Sensitivity Troponin:   Recent Labs  Lab 08/16/22 0627  TROPONINIHS 53*     Chemistry Recent Labs  Lab 08/16/22 1004 08/17/22 0520 08/18/22 0554 08/19/22 0243  NA  --  140 137 139  K  --  4.4 4.7 4.6  CL  --  98 93* 98  CO2  --  '22 24 25  '$ GLUCOSE  --  100* 103* 108*  BUN  --  69* 62* 82*  CREATININE 13.17* 14.36* 13.34* 15.84*  CALCIUM  --  8.6* 9.0 8.7*  MG  --  2.6* 2.3 2.4  PROT 7.3  --   --   --   ALBUMIN 3.3*  --   --   --   AST 19  --   --   --   ALT 13  --   --   --   ALKPHOS 59  --   --   --   BILITOT 0.7  --   --   --   GFRNONAA 4* 4* 4* 3*  ANIONGAP  --  20* 20* 16*  Lipids  Recent Labs  Lab 08/16/22 1004  CHOL 230*  TRIG 195*  HDL 27*  LDLCALC 164*  CHOLHDL 8.5    Hematology Recent Labs  Lab 08/16/22 0757 08/16/22 1257 08/19/22 0730  WBC  --  6.7 6.3  RBC  --  3.69* 3.17*  HGB 13.9 12.5* 10.5*  HCT 41.0 36.1* 30.5*  MCV  --  97.8 96.2  MCH  --  33.9 33.1  MCHC  --  34.6 34.4  RDW  --  16.0* 15.9*  PLT  --  205 171   Thyroid  Recent Labs  Lab 08/16/22 1004  TSH 3.364    BNP Recent Labs  Lab 08/16/22 0627 08/16/22 1257  BNP QUANTITY NOT SUFFICIENT, UNABLE TO PERFORM TEST 399.2*    DDimer No results for input(s): "DDIMER" in the last 168 hours.   Radiology    No results found.  Cardiac Studies   10/21/21: TTE  1. Left ventricular ejection fraction, by estimation, is 25 to 30%. The  left ventricle has severely decreased function. The left ventricle  demonstrates global hypokinesis. The left ventricular internal cavity size  was moderately dilated. Left  ventricular diastolic parameters are consistent with Grade I diastolic  dysfunction (impaired relaxation).   2. Right ventricular systolic function is normal. The right ventricular  size is normal. There is normal pulmonary artery systolic pressure. The  estimated right ventricular systolic pressure is 63.1 mmHg.   3. The mitral valve is normal in  structure. Mild mitral valve  regurgitation. No evidence of mitral stenosis.   4. The aortic valve is tricuspid. Aortic valve regurgitation is not  visualized. No aortic stenosis is present.   5. The inferior vena cava is normal in size with greater than 50%  respiratory variability, suggesting right atrial pressure of 3 mmHg.    07/08/21; LHC 1. Normal filling pressures and normal cardiac output.  2. No significant CAD.  Nonischemic cardiomyopathy.   Patient Profile     61 y.o. male with a hx of  hx of VF arrest (2015), CAD (PCI in 2005, unknown details), ICM,. Chronic CHF (systolic), ESRF on HD , OSA w/CPAP, HTN, HLD, recurrent angio edema admitted yesterday with VT storm.  CADwith prior PCI though cath Aug 2022 noted no obstructive disease Cardiolite 2017: Inferior and inferolateral large fixed defect suggestive of prior infarction, EF 28% Last Echo was Nov 2022, known reduced LVEF 25-30% (unchanged from 2021)     Device information BSCi S-ICD implanted  elsewhere 04/11/2014 > gen change 10/13/2020 + Hx of appropriate therapies Amiodarone started June 2023  ICD interrogation 08/16/22: Stable battery and electrode impedance 28 appropriate shocks  Assessment & Plan    VT storm Missed his amio  as it turns out for a couple weeks Transitioned to PO amiodarone last night  Last night had some recurrent NSVTs and bolused with amiodarone Pt felt them, nothing sustained, no ICD shocks Lytes remain OK  Resume amio gtt for today again Pt aware, hopefully transition to PO tomorrow and d/c Saturday    ESRF on HD Nephrology is on board Getting HD currently   Chronic CHF, NICM Volume with HD HF team is on board Too much ICD artifact yesterday with MRI scout images, imaging not completed   CAD, this is unclear No angina No obstructive CAD by cath Aug 2022  For questions or updates, please contact [place] Please consult [email or web address] for contact info under         Signed,  Renee Dyane Dustman, PA-C  08/19/2022, 9:41 AM     I have seen and examined this patient with [name].  Agree with above, note added to reflect my findings.  Developed more nonsustained VT overnight.  Felt palpitations.  Patient was transitioned to p.o. amiodarone yesterday.  Received 150 bolus of IV amiodarone due to nonsustained VT.  GEN: Well nourished, well developed, in no acute distress  HEENT: normal  Neck: no JVD, carotid bruits, or masses Cardiac: RRR; no murmurs, rubs, or gallops,no edema  Respiratory:  clear to auscultation bilaterally, normal work of breathing GI: soft, nontender, nondistended, + BS MS: no deformity or atrophy  Skin: warm and dry Neuro:  Strength and sensation are intact Psych: euthymic mood, full affect   VT storm: Amiodarone was switched to p.o. yesterday.  He began having more frequent episodes of ventricular tachycardia, nonsustained.  Due to that, have transitioned him to IV amiodarone.  We Catcher Dehoyos continue IV amiodarone for at least the next 24 hours. End-stage renal disease: Continue dialysis per nephrology Chronic systolic heart failure: Due to nonischemic cardiomyopathy.  Heart failure team is following.  Could not complete MRI due to artifact from ICD.  Volume managed by dialysis.  Eyla Tallon M. Rosaura Bolon MD 08/19/2022 10:03 AM

## 2022-08-19 NOTE — Progress Notes (Signed)
Received patient in bed to unit.  Alert and oriented.  Informed consent signed and in chart.   Treatment initiated: Midodrine 10 mg given prior to treatment   08/19/22 0720  Vitals  Temp 97.9 F (36.6 C)  Pulse Rate 78  Resp 14  BP 121/73  SpO2 100 %  Weight 125.9 kg (bed weight)  Type of Weight Pre-Dialysis  Oxygen Therapy  Patient Activity (if Appropriate) In bed  Pulse Oximetry Type Continuous  Pre Treatment  Is pt a NEW START this admission?  No  Vascular access used during treatment Graft  Patient is receiving dialysis in a chair No  Hemodialysis Consent Verified Yes  Hemodialysis Standing Orders Initiated Yes  ECG (Telemetry) Monitor On Yes  Prime Ordered Normal Saline  Length of  DialysisTreatment -hour(s) 4 Hour(s)  Dialysis mode HD  Dialyzer Revaclear 300  Dialysate 2K;2.5 Ca  Dialysis Anticoagulation Automated NS Flushes  Dialysate Flow Ordered 300  Blood Flow Rate Ordered 400 mL/min  Ultrafiltration Goal 3 Liters  Pre Treatment Labs CBC  Dialysis Blood Pressure Support Ordered Normal Saline      [facility]

## 2022-08-19 NOTE — Procedures (Signed)
I was present at this dialysis session. I have reviewed the session itself and made appropriate changes.   3L UF goal, K 4.6 will use a 3K bath.  AVF working well. Hb stable.   Ectopy overnight, EP/AHF following.    Filed Weights   08/17/22 0715 08/17/22 1100 08/19/22 0720  Weight: 121.5 kg 119.1 kg 125.9 kg    Recent Labs  Lab 08/19/22 0243  NA 139  K 4.6  CL 98  CO2 25  GLUCOSE 108*  BUN 82*  CREATININE 15.84*  CALCIUM 8.7*    Recent Labs  Lab 08/16/22 0757 08/16/22 1257 08/19/22 0730  WBC  --  6.7 6.3  HGB 13.9 12.5* 10.5*  HCT 41.0 36.1* 30.5*  MCV  --  97.8 96.2  PLT  --  205 171    Scheduled Meds:  (feeding supplement) PROSource Plus  30 mL Oral BID BM   aspirin EC  81 mg Oral Daily   atorvastatin  80 mg Oral QHS   carvedilol  6.25 mg Oral BID   Chlorhexidine Gluconate Cloth  6 each Topical Daily   docusate sodium  100 mg Oral BID   doxercalciferol  7 mcg Intravenous Q T,Th,Sa-HD   heparin  5,000 Units Subcutaneous Q8H   heparin sodium (porcine)       mometasone-formoterol  2 puff Inhalation BID   multivitamin  1 tablet Oral QHS   pentafluoroprop-tetrafluoroeth       sucroferric oxyhydroxide  1,000 mg Oral TID WC   Continuous Infusions:  amiodarone     amiodarone     PRN Meds:.acetaminophen, albuterol, heparin, heparin sodium (porcine), melatonin, mouth rinse, pentafluoroprop-tetrafluoroeth   Pearson Grippe  MD 08/19/2022, 8:17 AM

## 2022-08-19 NOTE — Progress Notes (Addendum)
Patient ID: Victor Thompson, male   DOB: 1961-09-07, 61 y.o.   MRN: [id number]     Advanced Heart Failure Rounding Note  PCP-Cardiologist: Loralie Champagne, MD   Subjective:    Lots of Ventricular ectopy overnight w/ PVCs and runs of NSVT. Rebolused w/ IV amio.   K 4.6 Mg 2.4   Unfortunately, unable to complete cMRI yesterday due to too much artifact from device.   Getting iHD today.   Says he was symptomatic last night w/ NSVT. Feels better today. Tolerating HD. No current complaints.    Objective:   Weight Range: 125.9 kg Body mass index is 43.47 kg/m.   Vital Signs:   Temp:  [97.8 F (36.6 C)-98.4 F (36.9 C)] 97.9 F (36.6 C) (09/14 0720) Pulse Rate:  [75-86] 75 (09/14 0730) Resp:  [14-22] 20 (09/14 0730) BP: (101-151)/(64-79) 121/78 (09/14 0730) SpO2:  [90 %-100 %] 98 % (09/14 0730) Weight:  [125.9 kg] 125.9 kg (09/14 0720) Last BM Date : 08/17/22  Weight change: Filed Weights   08/17/22 0715 08/17/22 1100 08/19/22 0720  Weight: 121.5 kg 119.1 kg 125.9 kg    Intake/Output:   Intake/Output Summary (Last 24 hours) at 08/19/2022 0747 Last data filed at 08/19/2022 0543 Gross per 24 hour  Intake 211.69 ml  Output --  Net 211.69 ml      Physical Exam    General:  Well appearing. No resp difficulty HEENT: Normal Neck: Supple. Thick neck, JVD not well visualized. Carotids 2+ bilat; no bruits. No lymphadenopathy or thyromegaly appreciated. Cor: PMI nondisplaced. Regular rate & rhythm. No rubs, gallops or murmurs. Lungs: CTAB. No wheezing  Abdomen: Soft, nontender, nondistended. No hepatosplenomegaly. No bruits or masses. Good bowel sounds. Extremities: No cyanosis, clubbing, rash, edema Neuro: Alert & orientedx3, cranial nerves grossly intact. moves all 4 extremities w/o difficulty. Affect pleasant   Telemetry   NSR 80s, runs of NSVT, PVCs overnight (personally reviewed)   Labs    CBC Recent Labs    08/16/22 0757 08/16/22 1257  WBC  --  6.7  HGB  13.9 12.5*  HCT 41.0 36.1*  MCV  --  97.8  PLT  --  841   Basic Metabolic Panel Recent Labs    08/18/22 0554 08/19/22 0243  NA 137 139  K 4.7 4.6  CL 93* 98  CO2 24 25  GLUCOSE 103* 108*  BUN 62* 82*  CREATININE 13.34* 15.84*  CALCIUM 9.0 8.7*  MG 2.3 2.4   Liver Function Tests Recent Labs    08/16/22 1004  AST 19  ALT 13  ALKPHOS 59  BILITOT 0.7  PROT 7.3  ALBUMIN 3.3*   No results for input(s): "LIPASE", "AMYLASE" in the last 72 hours. Cardiac Enzymes No results for input(s): "CKTOTAL", "CKMB", "CKMBINDEX", "TROPONINI" in the last 72 hours.  BNP: BNP (last 3 results) Recent Labs    05/25/22 0705 08/16/22 0627 08/16/22 1257  BNP 1,355.4* QUANTITY NOT SUFFICIENT, UNABLE TO PERFORM TEST 399.2*    ProBNP (last 3 results) No results for input(s): "PROBNP" in the last 8760 hours.   D-Dimer No results for input(s): "DDIMER" in the last 72 hours. Hemoglobin A1C No results for input(s): "HGBA1C" in the last 72 hours. Fasting Lipid Panel Recent Labs    08/16/22 1004  CHOL 230*  HDL 27*  LDLCALC 164*  TRIG 195*  CHOLHDL 8.5   Thyroid Function Tests Recent Labs    08/16/22 1004  TSH 3.364    Other results:   Imaging  No results found.   Medications:     Scheduled Medications:  (feeding supplement) PROSource Plus  30 mL Oral BID BM   amiodarone  400 mg Oral BID   aspirin EC  81 mg Oral Daily   atorvastatin  80 mg Oral QHS   carvedilol  3.125 mg Oral BID   Chlorhexidine Gluconate Cloth  6 each Topical Daily   docusate sodium  100 mg Oral BID   doxercalciferol  7 mcg Intravenous Q T,Th,Sa-HD   heparin  5,000 Units Subcutaneous Q8H   heparin sodium (porcine)       mometasone-formoterol  2 puff Inhalation BID   multivitamin  1 tablet Oral QHS   pentafluoroprop-tetrafluoroeth       sucroferric oxyhydroxide  1,000 mg Oral TID WC    Infusions:    PRN Medications: acetaminophen, albuterol, heparin sodium (porcine), melatonin,  mouth rinse, pentafluoroprop-tetrafluoroeth   Assessment/Plan   1. VT storm: VT storm episode in 6/23 and amiodarone started. 28 shocks on 9/11 via [place].  Nonischemic cardiomyopathy and HS-TnI not significantly elevated with no chest pain, doubt ACS.  On discussion with patient, he has likely been off amiodarone for several weeks (had stopped all meds).  - Reloaded w/ IV amiodarone and transitioned to PO 400 mg bid. Had recurrence of NSVT overnight. D/w EP. Will rebolus w/ IV amio and place back on gtt  - electrolytes ok. Keep K > 4.0 and Mg > 2.0  - Apparently, he has been off Coreg at home.  Restarted at 3.125 mg bid.  - Plan was for Cardiac MRI to look for infiltrative disease/sarcoidosis. However unable to complete imaging given too much artifact from device. Plan for cardiac PET at [facility]. Check PYP for TTR amyloid assessment  - if not due to sarcoid, ? VT ablation.  2. Acute on chronic systolic CHF: Suspect primarily nonischemic cardiomyopathy.  Echo in 2/22 with EF 25-30%, moderate LV dilation, mild LVH, normal RV size and systolic function, mild MR. Coronary angiography was done in 8/22, no significant CAD.  He has a Research officer, political party subcutaneous ICD. Echo 11/22 EF 25-30%, moderate LV dilation, normal RV.  He is not significantly volume overloaded on exam, stable NYHA II symptoms. He has been getting HD regularly without missing sessions.  BNP 399, not particularly elevated in setting of ESRD.  - Continue Coreg at 3.125 mg bid - HD for volume management, to get today.  - Probably has not been taking hydralazine/Imdur at home.  Hold for now and see if we can restart based on BP.  - ECG with IVCD 150 msec, thought not to be good candidate for CRT upgrade (not true LBBB) - Concern for possible infiltrative disease/sarcoidosis. Unfortunately, unable to get cMRI as outlined above 3. CAD: PCI in 2005, unsure what vessel.  Coronary angiography in 8/22 showed no significant CAD and also does  not appear to show prior stent so h/o CAD is questionable. No chest pain.  HS-TnI not significantly elevated, doubt ACS.  - LDL high, had been off atorvastatin => restarted at 80 mg daily.  - Continue ASA 81 daily.  4. ESRD: Since 2/22.  Turned down for renal transplant due to low EF.  - Avoid hypotension - HD per nephrology 5. OSA: Continue CPAP.  6. Angioedema: Recurrent episodes.  He is not on a med that commonly causes angioedema.  No recent episodes.    Length of Stay: [address] Rosita Fire, PA-C  08/19/2022, 7:47 AM  Advanced Heart  Failure Team Pager [phone] (M-F; 7a - 5p)  Please contact [facility] for night-coverage after hours (5p -7a ) and weekends on [email or web address]   Patient seen with PA, agree with the above note.   Unable to get cMRI due to artifact from ICD.    Paitent had increased PVCs and NSVT overnight.  Bolused with amiodarone and gtt restarted.  Patient felt palpitations.   Seen at HD.   General: NAD Neck: Thick. No JVD, no thyromegaly or thyroid nodule.  Lungs: Clear to auscultation bilaterally with normal respiratory effort. CV: Nondisplaced PMI.  Heart regular S1/S2, no S3/S4, no murmur.  No peripheral edema.  Abdomen: Soft, nontender, no hepatosplenomegaly, no distention.  Skin: Intact without lesions or rashes.  Neurologic: Alert and oriented x 3.  Psych: Normal affect. Extremities: No clubbing or cyanosis.  HEENT: Normal.   Discussed with EP, will transition back to IV amiodarone today to continue IV load.  Patient off amiodarone for several weeks prior to admission.  Will also increase Coreg to 6.25 mg bid.   NICM, will be unable to complete cMRI for infiltrative disease due to ICD artifact.   Loralie Champagne 08/19/2022 8:34 AM

## 2022-08-19 NOTE — Procedures (Signed)
HD Note:  Some information was entered later than the data was gathered due to patient care needs. The entered time with the data is accurate.   Received patient in bed to unit.  Alert and oriented.  Informed consent signed and in chart.   BFR was decreased to 300 related to arterial pull pressures not WDL.  No other issues with fistula or treatment.  Patient tolerated well.  Transported back to the room  Alert, without acute distress.  Hand-off given to patient's nurse.    Total UF removed: 3000 Medication(s) given: see MAR    [facility]

## 2022-08-19 NOTE — Progress Notes (Signed)
  Treatment completed:  Patient tolerated well.  Pt awaiting transport back to the room  Alert, without acute distress.  Hand-off given to patient's nurse.   Access used: R AVG Access issues: BFR reduced due to increased AVP  Total UF removed: 3L Medication(s) given: Hectorol, Tylenol, Heparin, Midodrine, and Amiodarone.    08/19/22 1241  Vitals  Temp 97.8 F (36.6 C)  Pulse Rate 64  Resp (!) 23  BP 118/89  SpO2 99 %  O2 Device Room Air  Weight 122.3 kg (bed weight)  Type of Weight Post-Dialysis  Oxygen Therapy  Patient Activity (if Appropriate) In bed  Pulse Oximetry Type Continuous  Post Treatment  Dialyzer Clearance Heavily streaked  Duration of HD Treatment -hour(s) 4 hour(s)  Liters Processed 68.6  Fluid Removed 3000 mL  Tolerated HD Treatment Yes  AVG/AVF Arterial Site Held (minutes) 5 minutes  AVG/AVF Venous Site Held (minutes) 5 minutes    [facility]

## 2022-08-20 ENCOUNTER — Inpatient Hospital Stay (HOSPITAL_COMMUNITY): Payer: Medicare Other

## 2022-08-20 DIAGNOSIS — I472 Ventricular tachycardia, unspecified: Secondary | ICD-10-CM | POA: Diagnosis not present

## 2022-08-20 LAB — BASIC METABOLIC PANEL
Anion gap: 18 — ABNORMAL HIGH (ref 5–15)
BUN: 55 mg/dL — ABNORMAL HIGH (ref 6–20)
CO2: 26 mmol/L (ref 22–32)
Calcium: 9.3 mg/dL (ref 8.9–10.3)
Chloride: 93 mmol/L — ABNORMAL LOW (ref 98–111)
Creatinine, Ser: 12.16 mg/dL — ABNORMAL HIGH (ref 0.61–1.24)
GFR, Estimated: 4 mL/min — ABNORMAL LOW (ref >60)
Glucose, Bld: 104 mg/dL — ABNORMAL HIGH (ref 70–99)
Potassium: 4.4 mmol/L (ref 3.5–5.1)
Sodium: 137 mmol/L (ref 135–145)

## 2022-08-20 LAB — MAGNESIUM: Magnesium: 2.1 mg/dL (ref 1.7–2.4)

## 2022-08-20 MED ORDER — AMIODARONE HCL 200 MG PO TABS
400.0000 mg | ORAL_TABLET | Freq: Two times a day (BID) | ORAL | Status: DC
  Administered 2022-08-20 – 2022-08-21 (×3): 400 mg via ORAL
  Filled 2022-08-20 (×3): qty 2

## 2022-08-20 MED ORDER — TECHNETIUM TC 99M PYROPHOSPHATE
21.3000 | Freq: Once | INTRAVENOUS | Status: AC | PRN
  Administered 2022-08-20: 21.3 via INTRAVENOUS

## 2022-08-20 NOTE — TOC Initial Note (Signed)
Transition of Care [facility]) - Initial/Assessment Note    Patient Details  Name: Victor Thompson MRN: [id number] Date of Birth: Sep 19, 1961  Transition of [facility]) CM/SW Contact:    Erenest Rasher, RN Phone Number: [phone] 08/20/2022, 12:12 PM  Clinical Narrative:                 HF TOC CM spoke to pt at bedside. States he does have a scale but does not do daily weight. States they weigh him at dialysis. States his cousin assist with transportation. Pt states they reduced his food stamps. Explained he can provide DSS a letter stating cousin does not assist him with food and that they cook separately.   Expected Discharge Plan: Home/Self Care Barriers to Discharge: Continued Medical Work up   Patient Goals and CMS Choice Patient states their goals for this hospitalization and ongoing recovery are:: wants to remain independent      Expected Discharge Plan and Services Expected Discharge Plan: Home/Self Care   Discharge Planning Services: CM Consult   Living arrangements for the past 2 months: Single Family Home                                      Prior Living Arrangements/Services Living arrangements for the past 2 months: Single Family Home Lives with:: Relatives   Do you feel safe going back to the place where you live?: Yes      Need for Family Participation in Patient Care: No (Comment) Care giver support system in place?: Yes (comment) Current home services: DME Criminal Activity/Legal Involvement Pertinent to Current Situation/Hospitalization: No - Comment as needed  Activities of Daily Living Home Assistive Devices/Equipment: None ADL Screening (condition at time of admission) Patient's cognitive ability adequate to safely complete daily activities?: Yes Is the patient deaf or have difficulty hearing?: No Does the patient have difficulty seeing, even when wearing glasses/contacts?: No Does the patient have difficulty concentrating, remembering, or  making decisions?: No Patient able to express need for assistance with ADLs?: Yes Does the patient have difficulty dressing or bathing?: No Independently performs ADLs?: Yes (appropriate for developmental age) Does the patient have difficulty walking or climbing stairs?: No Weakness of Legs: None Weakness of Arms/Hands: None  Permission Sought/Granted Permission sought to share information with : Case Manager, Family Supports, PCP Permission granted to share information with : Yes, Verbal Permission Granted  Share Information with NAME: Mykeal Carrick     Permission granted to share info w Relationship: cousin  Permission granted to share info w Contact Information: [phone]  Emotional Assessment Appearance:: Appears stated age Attitude/Demeanor/Rapport: Unable to Assess Affect (typically observed): Accepting Orientation: : Oriented to Self, Oriented to Place, Oriented to  Time, Oriented to Situation   Psych Involvement: No (comment)  Admission diagnosis:  Paroxysmal ventricular tachycardia (HCC) [I47.29] VT (ventricular tachycardia) ([place]) [I47.20] AICD discharge [Z45.02] Patient Active Problem List   Diagnosis Date Noted   VT (ventricular tachycardia) ([place]) 08/16/2022   Chronic obstructive pulmonary disease, unspecified COPD type ([place]) 06/18/2022   ICD (implantable cardioverter-defibrillator) discharge 05/25/2022   Seasonal and perennial allergic rhinitis 11/12/2021   Moderate persistent asthma, uncomplicated 31/54/0086   STD exposure 08/27/2021   ESRD on dialysis ([place]) 02/06/2021   Hypervolemia associated with renal insufficiency 01/12/2021   Angioedema 09/22/2020   Secondary hyperparathyroidism of renal origin ([place]) 07/15/2019   Vitamin D deficiency 07/15/2019  CAD (coronary artery disease), native coronary artery 04/10/2019   Chronic midline low back pain without sciatica 02/17/2018   Ventricular tachycardia ([place]) 03/29/2016   Defibrillator discharge    Chronic  combined systolic and diastolic CHF (congestive heart failure) ([place]) 01/22/2016   Erectile dysfunction 01/22/2016   Allergic rhinitis 01/22/2016   GERD (gastroesophageal reflux disease) 01/22/2016   PROTEINURIA 02/17/2010   Cardiomyopathy, ischemic 02/16/2010   Obstructive sleep apnea 02/16/2010   EXTERNAL HEMORRHOIDS 09/26/2009   Tobacco use disorder in remission 06/03/2009   Essential hypertension 10/01/2008   Hyperlipidemia 07/14/2007   Class 3 severe obesity due to excess calories with serious comorbidity and body mass index (BMI) of 40.0 to 44.9 in adult [facility] 07/14/2007   Asthma 07/14/2007   PCP:  [name], MD Pharmacy:   [facility] [address] [place] [address] Phone: [phone] Fax: [phone]  [facility] [place] - [place], [place] [address] Phone: [phone] Fax: [phone]     Social Determinants of Health (SDOH) Interventions Utilities Interventions: Intervention Not Indicated  Readmission Risk Interventions     No data to display

## 2022-08-20 NOTE — Progress Notes (Addendum)
Patient ID: Victor Thompson, male   DOB: August 11, 1961, 61 y.o.   MRN: [id number]     Advanced Heart Failure Rounding Note  PCP-Cardiologist: Loralie Champagne, MD   Subjective:    Transitioned back to PO amio. NSR. No further VT.   K 4.4 Mg 2.1  Unfortunately, unable to complete cMRI due to too much artifact from device. Getting PYP scan today to assess for TTR amyloid.   Volume controlled through iHD. Next session is tomorrow.   Feels well. No CP, dyspnea, palpitations. Wants to go home.   Objective:   Weight Range: 122.3 kg Body mass index is 42.23 kg/m.   Vital Signs:   Temp:  [97.8 F (36.6 C)-98.7 F (37.1 C)] 98.7 F (37.1 C) (09/15 0745) Pulse Rate:  [56-80] 78 (09/15 0745) Resp:  [14-25] 19 (09/15 0745) BP: (93-121)/(56-89) 104/61 (09/15 0745) SpO2:  [94 %-100 %] 94 % (09/15 0745) Weight:  [122.3 kg] 122.3 kg (09/14 1241) Last BM Date : 08/17/22  Weight change: Filed Weights   08/17/22 1100 08/19/22 0720 08/19/22 1241  Weight: 119.1 kg 125.9 kg 122.3 kg    Intake/Output:   Intake/Output Summary (Last 24 hours) at 08/20/2022 0951 Last data filed at 08/20/2022 0746 Gross per 24 hour  Intake 824.44 ml  Output 3000 ml  Net -2175.56 ml      Physical Exam    General:  Well appearing. No respiratory difficulty HEENT: normal Neck: supple. no JVD. Carotids 2+ bilat; no bruits. No lymphadenopathy or thyromegaly appreciated. Cor: PMI nondisplaced. Regular rate & rhythm. No rubs, gallops or murmurs. Lungs: clear Abdomen: soft, nontender, nondistended. No hepatosplenomegaly. No bruits or masses. Good bowel sounds. Extremities: no cyanosis, clubbing, rash, edema Neuro: alert & oriented x 3, cranial nerves grossly intact. moves all 4 extremities w/o difficulty. Affect pleasant.  Telemetry   NSR 80s, no further VT (personally reviewed)   Labs    CBC Recent Labs    08/19/22 0730  WBC 6.3  HGB 10.5*  HCT 30.5*  MCV 96.2  PLT 902   Basic Metabolic  Panel Recent Labs    08/19/22 0243 08/20/22 0732  NA 139 137  K 4.6 4.4  CL 98 93*  CO2 25 26  GLUCOSE 108* 104*  BUN 82* 55*  CREATININE 15.84* 12.16*  CALCIUM 8.7* 9.3  MG 2.4 2.1   Liver Function Tests No results for input(s): "AST", "ALT", "ALKPHOS", "BILITOT", "PROT", "ALBUMIN" in the last 72 hours.  No results for input(s): "LIPASE", "AMYLASE" in the last 72 hours. Cardiac Enzymes No results for input(s): "CKTOTAL", "CKMB", "CKMBINDEX", "TROPONINI" in the last 72 hours.  BNP: BNP (last 3 results) Recent Labs    05/25/22 0705 08/16/22 0627 08/16/22 1257  BNP 1,355.4* QUANTITY NOT SUFFICIENT, UNABLE TO PERFORM TEST 399.2*    ProBNP (last 3 results) No results for input(s): "PROBNP" in the last 8760 hours.   D-Dimer No results for input(s): "DDIMER" in the last 72 hours. Hemoglobin A1C No results for input(s): "HGBA1C" in the last 72 hours. Fasting Lipid Panel No results for input(s): "CHOL", "HDL", "LDLCALC", "TRIG", "CHOLHDL", "LDLDIRECT" in the last 72 hours.  Thyroid Function Tests No results for input(s): "TSH", "T4TOTAL", "T3FREE", "THYROIDAB" in the last 72 hours.  Invalid input(s): "FREET3"   Other results:   Imaging    No results found.   Medications:     Scheduled Medications:  (feeding supplement) PROSource Plus  30 mL Oral BID BM   amiodarone  400 mg Oral BID  aspirin EC  81 mg Oral Daily   atorvastatin  80 mg Oral QHS   carvedilol  6.25 mg Oral BID   Chlorhexidine Gluconate Cloth  6 each Topical Daily   docusate sodium  100 mg Oral BID   doxercalciferol  7 mcg Intravenous Q T,Th,Sa-HD   heparin  5,000 Units Subcutaneous Q8H   mometasone-formoterol  2 puff Inhalation BID   multivitamin  1 tablet Oral QHS   sucroferric oxyhydroxide  1,000 mg Oral TID WC    Infusions:    PRN Medications: acetaminophen, albuterol, melatonin, mouth rinse   Assessment/Plan   1. VT storm: VT storm episode in 6/23 and amiodarone started.  28 shocks on 9/11 via [place].  Nonischemic cardiomyopathy and HS-TnI not significantly elevated with no chest pain, doubt ACS.  On discussion with patient, he has likely been off amiodarone for several weeks (had stopped all meds). Reloaded w/ IV amiodarone and transitioned to PO 400 mg bid. No further VT. EP following  - electrolytes ok. Keep K > 4.0 and Mg > 2.0  - Apparently, he has been off Coreg at home.  Restarted and dose titrated to 6.25 mg bid  - Plan was for Cardiac MRI to look for infiltrative disease/sarcoidosis. However unable to complete imaging given too much artifact from device. Plan [facility]. Check PYP for TTR amyloid assessment  - if not due to sarcoid, ? VT ablation.  2. Acute on chronic systolic CHF: Suspect primarily nonischemic cardiomyopathy.  Echo in 2/22 with EF 25-30%, moderate LV dilation, mild LVH, normal RV size and systolic function, mild MR. Coronary angiography was done in 8/22, no significant CAD.  He has a Research officer, political party subcutaneous ICD. Echo 11/22 EF 25-30%, moderate LV dilation, normal RV.  He is not significantly volume overloaded on exam, stable NYHA II symptoms. He has been getting HD regularly without missing sessions.  BNP 399, not particularly elevated in setting of ESRD.  - Continue Coreg 6.25 mg bid - HD for volume management. On Tue,Thu,Sat schedule   - Probably has not been taking hydralazine/Imdur at home.  Hold for now and see if we can restart based on BP.  - ECG with IVCD 150 msec, thought not to be good candidate for CRT upgrade (not true LBBB) - Concern for possible infiltrative disease/sarcoidosis. Unfortunately, unable to get cMRI as outlined above - Plan PYP today to assess for TTR amyloid - If PYP negative, will send to [facility] for cardiac PET to assess for sarcoid  3. CAD: PCI in 2005, unsure what vessel.  Coronary angiography in 8/22 showed no significant CAD and also does not appear to show prior stent so h/o CAD is  questionable. No chest pain.  HS-TnI not significantly elevated, doubt ACS.  - LDL high, had been off atorvastatin => restarted at 80 mg daily.  - Continue ASA 81 daily.  4. ESRD: Since 2/22.  Turned down for renal transplant due to low EF.  - Avoid hypotension - HD per nephrology 5. OSA: Continue CPAP.  6. Angioedema: Recurrent episodes.  He is not on a med that commonly causes angioedema.  No recent episodes.    Length of Stay: [address], PA-C  08/20/2022, 9:51 AM  Advanced Heart Failure Team Pager [phone] (M-F; 7a - 5p)  Please contact [facility] for night-coverage after hours (5p -7a ) and weekends on [email or web address]   Patient seen with PA, agree with the above note.   He is  now on po amiodarone, no further VT.    PYP scan today was not suggestive of TTR cardiac amyloidosis.   General: NAD Neck: No JVD, no thyromegaly or thyroid nodule.  Lungs: Clear to auscultation bilaterally with normal respiratory effort. CV: Nondisplaced PMI.  Heart regular S1/S2, no S3/S4, no murmur.  No peripheral edema.   Abdomen: Soft, nontender, no hepatosplenomegaly, no distention.  Skin: Intact without lesions or rashes.  Neurologic: Alert and oriented x 3.  Psych: Normal affect. Extremities: No clubbing or cyanosis.  HEENT: Normal.   Continue po amiodarone load.    PYP scan not suggestive of TTR amyloidosis, unable to get cardiac MRI.  Would be reasonable to arrange for cardiac PET to look for cardiac sarcoidosis as outpatient.   To get HD tomorrow, hopefully home afterwards.   Loralie Champagne 08/20/2022 4:25 PM

## 2022-08-20 NOTE — Progress Notes (Signed)
Admit: 08/16/2022 LOS: 4  58M ESRD chronic HFrEF with VTAch Storm  Subjective:  HD yesterday, 3L UF No interval events, back on PO amio  09/14 0701 - 09/15 0700 In: 584.4 [P.O.:240; I.V.:344.4] Out: 3000   Filed Weights   08/17/22 1100 08/19/22 0720 08/19/22 1241  Weight: 119.1 kg 125.9 kg 122.3 kg    Scheduled Meds:  (feeding supplement) PROSource Plus  30 mL Oral BID BM   amiodarone  400 mg Oral BID   aspirin EC  81 mg Oral Daily   atorvastatin  80 mg Oral QHS   carvedilol  6.25 mg Oral BID   Chlorhexidine Gluconate Cloth  6 each Topical Daily   docusate sodium  100 mg Oral BID   doxercalciferol  7 mcg Intravenous Q T,Th,Sa-HD   heparin  5,000 Units Subcutaneous Q8H   mometasone-formoterol  2 puff Inhalation BID   multivitamin  1 tablet Oral QHS   sucroferric oxyhydroxide  1,000 mg Oral TID WC   Continuous Infusions: PRN Meds:.acetaminophen, albuterol, melatonin, mouth rinse  Current Labs: reviewed    Physical Exam:  Blood pressure 106/66, pulse 76, temperature 98.4 F (36.9 C), temperature source Oral, resp. rate 18, height $RemoveBe'5\' 7"'[id number]$  (1.702 m), weight 122.3 kg, SpO2 99 %. NAD RRR CTAB RUE AVF +B/T No LEE S/nt/nd  Dialysis Orders:  TTS at [facility] 4:15hr, 450/A1.5, EDW 116.4kg (usually leaves ~117kg), 2K/2Ca, UFP #4, AVF, heparin 3000 unit bolus - Hectoral 108mcg IV q HD - no ESA, Hgb > 11  A Assessment/Plan:  VT storm: reloading on amio, resuming and uptitrating BB, no further events per EP/AHF  ESRD: On schedule, HD tomorrow. Bolus heparin.  Give midodrine given tendency towards IDH  Hypertension/volume BP often drops with dialysis. UF as tolerated with next HD.  Use BB to extent BPs permit  Anemia: Hgb remains > 10, no ESA needed.  Metabolic bone disease: Ca ok, Continue binders + VDRA.  Nutrition:  Alb low, will add protein supplements.  HFrEF   Pearson Grippe MD 08/20/2022, 12:36 PM  Recent Labs  Lab 08/18/22 0554 08/19/22 0243 08/20/22 0732  NA 137  139 137  K 4.7 4.6 4.4  CL 93* 98 93*  CO2 $Re'24 25 26  'eWs$ GLUCOSE 103* 108* 104*  BUN 62* 82* 55*  CREATININE 13.34* 15.84* 12.16*  CALCIUM 9.0 8.7* 9.3    Recent Labs  Lab 08/16/22 0757 08/16/22 1257 08/19/22 0730  WBC  --  6.7 6.3  HGB 13.9 12.5* 10.5*  HCT 41.0 36.1* 30.5*  MCV  --  97.8 96.2  PLT  --  205 171

## 2022-08-20 NOTE — Progress Notes (Signed)
Rounding Note    [name] Date of Encounter: 08/20/2022  [place] Cardiologist: Loralie Champagne, MD   Subjective   NAEON. Completed nuc scan earlier today. Requesting to go home to have different food.   Inpatient Medications    Scheduled Meds:  (feeding supplement) PROSource Plus  30 mL Oral BID BM   amiodarone  400 mg Oral BID   aspirin EC  81 mg Oral Daily   atorvastatin  80 mg Oral QHS   carvedilol  6.25 mg Oral BID   Chlorhexidine Gluconate Cloth  6 each Topical Daily   docusate sodium  100 mg Oral BID   doxercalciferol  7 mcg Intravenous Q T,Th,Sa-HD   heparin  5,000 Units Subcutaneous Q8H   mometasone-formoterol  2 puff Inhalation BID   multivitamin  1 tablet Oral QHS   sucroferric oxyhydroxide  1,000 mg Oral TID WC   Continuous Infusions:    PRN Meds: acetaminophen, albuterol, melatonin, mouth rinse   Vital Signs    Vitals:   08/20/22 0256 08/20/22 0745 08/20/22 0747 08/20/22 1054  BP: 108/75 104/61 104/61 106/66  Pulse: 75 78 77 76  Resp: '20 19 14 18  '$ Temp: 98.6 F (37 C) 98.7 F (37.1 C)  98.4 F (36.9 C)  TempSrc: Oral Oral  Oral  SpO2: 97% 94% 96% 99%  Weight:      Height:        Intake/Output Summary (Last 24 hours) at 08/20/2022 1349 Last data filed at 08/20/2022 0746 Gross per 24 hour  Intake 824.44 ml  Output --  Net 824.44 ml      08/19/2022   12:41 PM 08/19/2022    7:20 AM 08/17/2022   11:00 AM  Last 3 Weights  Weight (lbs) 269 lb 10 oz 277 lb 9 oz 262 lb 9.1 oz  Weight (kg) 122.3 kg 125.9 kg 119.1 kg      Telemetry    SR 60-70's, infrequent PVCs,  - Personally Reviewed  ECG    No new EKGs - Personally Reviewed  Physical Exam   Exam remains unchanged GEN: No acute distress.   Neck: No JVD Cardiac: RRR, no murmurs, rubs, or gallops.  Respiratory: CTA b/l, normal WOB GI: Soft, nontender, non-distended  MS: trace edema; No deformity. Neuro:  Nonfocal  Psych: Normal affect   Labs     High Sensitivity Troponin:   Recent Labs  Lab 08/16/22 0627  TROPONINIHS 53*     Chemistry Recent Labs  Lab 08/16/22 1004 08/17/22 0520 08/18/22 0554 08/19/22 0243 08/20/22 0732  NA  --    < > 137 139 137  K  --    < > 4.7 4.6 4.4  CL  --    < > 93* 98 93*  CO2  --    < > '24 25 26  '$ GLUCOSE  --    < > 103* 108* 104*  BUN  --    < > 62* 82* 55*  CREATININE 13.17*   < > 13.34* 15.84* 12.16*  CALCIUM  --    < > 9.0 8.7* 9.3  MG  --    < > 2.3 2.4 2.1  PROT 7.3  --   --   --   --   ALBUMIN 3.3*  --   --   --   --   AST 19  --   --   --   --   ALT 13  --   --   --   --  ALKPHOS 59  --   --   --   --   BILITOT 0.7  --   --   --   --   GFRNONAA 4*   < > 4* 3* 4*  ANIONGAP  --    < > 20* 16* 18*   < > = values in this interval not displayed.    Lipids  Recent Labs  Lab 08/16/22 1004  CHOL 230*  TRIG 195*  HDL 27*  LDLCALC 164*  CHOLHDL 8.5    Hematology Recent Labs  Lab 08/16/22 0757 08/16/22 1257 08/19/22 0730  WBC  --  6.7 6.3  RBC  --  3.69* 3.17*  HGB 13.9 12.5* 10.5*  HCT 41.0 36.1* 30.5*  MCV  --  97.8 96.2  MCH  --  33.9 33.1  MCHC  --  34.6 34.4  RDW  --  16.0* 15.9*  PLT  --  205 171   Thyroid  Recent Labs  Lab 08/16/22 1004  TSH 3.364    BNP Recent Labs  Lab 08/16/22 0627 08/16/22 1257  BNP QUANTITY NOT SUFFICIENT, UNABLE TO PERFORM TEST 399.2*    DDimer No results for input(s): "DDIMER" in the last 168 hours.   Radiology    No results found.  Cardiac Studies   08/20/22: PYP nuc med Visual and quantitative assessment (grade 0, H/CL = 0.94) is not suggestive of transthyretin amyloidosis  10/21/21: TTE  1. Left ventricular ejection fraction, by estimation, is 25 to 30%. The  left ventricle has severely decreased function. The left ventricle  demonstrates global hypokinesis. The left ventricular internal cavity size  was moderately dilated. Left  ventricular diastolic parameters are consistent with Grade I diastolic  dysfunction  (impaired relaxation).   2. Right ventricular systolic function is normal. The right ventricular  size is normal. There is normal pulmonary artery systolic pressure. The  estimated right ventricular systolic pressure is 50.3 mmHg.   3. The mitral valve is normal in structure. Mild mitral valve  regurgitation. No evidence of mitral stenosis.   4. The aortic valve is tricuspid. Aortic valve regurgitation is not  visualized. No aortic stenosis is present.   5. The inferior vena cava is normal in size with greater than 50%  respiratory variability, suggesting right atrial pressure of 3 mmHg.    07/08/21; LHC 1. Normal filling pressures and normal cardiac output.  2. No significant CAD.  Nonischemic cardiomyopathy.   Patient Profile     61 y.o. male with a hx of  hx of VF arrest (2015), CAD (PCI in 2005, unknown details), ICM,. Chronic CHF (systolic), ESRF on HD , OSA w/CPAP, HTN, HLD, recurrent angio edema admitted 9/11 with VT storm.  CAD with prior PCI though cath Aug 2022 noted no obstructive disease Cardiolite 2017: Inferior and inferolateral large fixed defect suggestive of prior infarction, EF 28% Last Echo was Nov 2022, known reduced LVEF 25-30% (unchanged from 2021)     Device information BSCi S-ICD implanted  elsewhere 04/11/2014 > gen change 10/13/2020 + Hx of appropriate therapies Amiodarone started June 2023  ICD interrogation 08/16/22: Stable battery and electrode impedance 28 appropriate shocks  Assessment & Plan    VT storm Did not take amio for several weeks prior to admission Transitioned back to amio gtt yesterday, tele much better after and overnight.  - will attempt to transition to PO today. If tolerates, will plan to dc tomorrow (Saturday) - lytes look good this AM.   ESRF on HD  Nephrology is on board. T/Th/Sa   Chronic CHF, NICM Volume managed with HD HF team is on board Too much ICD artifact with MRI scout images, imaging not completed S/p PYP nuc med  scan today, not suggestive of transthyretin amyloidosis   CAD, this is unclear No angina No obstructive CAD by cath Aug 2022  For questions or updates, please contact [place] Please consult [email or web address] for contact info under   Mamie Levers, NP  08/20/2022, 1:49 PM

## 2022-08-21 LAB — RENAL FUNCTION PANEL
Albumin: 3.1 g/dL — ABNORMAL LOW (ref 3.5–5.0)
Anion gap: 18 — ABNORMAL HIGH (ref 5–15)
BUN: 75 mg/dL — ABNORMAL HIGH (ref 6–20)
CO2: 24 mmol/L (ref 22–32)
Calcium: 8.9 mg/dL (ref 8.9–10.3)
Chloride: 95 mmol/L — ABNORMAL LOW (ref 98–111)
Creatinine, Ser: 14.77 mg/dL — ABNORMAL HIGH (ref 0.61–1.24)
GFR, Estimated: 3 mL/min — ABNORMAL LOW (ref >60)
Glucose, Bld: 150 mg/dL — ABNORMAL HIGH (ref 70–99)
Phosphorus: 7 mg/dL — ABNORMAL HIGH (ref 2.5–4.6)
Potassium: 4.4 mmol/L (ref 3.5–5.1)
Sodium: 137 mmol/L (ref 135–145)

## 2022-08-21 LAB — BASIC METABOLIC PANEL
Anion gap: 18 — ABNORMAL HIGH (ref 5–15)
BUN: 74 mg/dL — ABNORMAL HIGH (ref 6–20)
CO2: 24 mmol/L (ref 22–32)
Calcium: 8.9 mg/dL (ref 8.9–10.3)
Chloride: 95 mmol/L — ABNORMAL LOW (ref 98–111)
Creatinine, Ser: 14.77 mg/dL — ABNORMAL HIGH (ref 0.61–1.24)
GFR, Estimated: 3 mL/min — ABNORMAL LOW (ref >60)
Glucose, Bld: 151 mg/dL — ABNORMAL HIGH (ref 70–99)
Potassium: 4.4 mmol/L (ref 3.5–5.1)
Sodium: 137 mmol/L (ref 135–145)

## 2022-08-21 LAB — CBC
HCT: 31.1 % — ABNORMAL LOW (ref 39.0–52.0)
Hemoglobin: 10.5 g/dL — ABNORMAL LOW (ref 13.0–17.0)
MCH: 33.1 pg (ref 26.0–34.0)
MCHC: 33.8 g/dL (ref 30.0–36.0)
MCV: 98.1 fL (ref 80.0–100.0)
Platelets: 160 10*3/uL (ref 150–400)
RBC: 3.17 MIL/uL — ABNORMAL LOW (ref 4.22–5.81)
RDW: 15.8 % — ABNORMAL HIGH (ref 11.5–15.5)
WBC: 6.3 10*3/uL (ref 4.0–10.5)
nRBC: 0 % (ref 0.0–0.2)

## 2022-08-21 LAB — MAGNESIUM: Magnesium: 2.1 mg/dL (ref 1.7–2.4)

## 2022-08-21 MED ORDER — AMIODARONE HCL 200 MG PO TABS
ORAL_TABLET | ORAL | 11 refills | Status: DC
  Filled 2022-08-21: qty 70, 28d supply, fill #0

## 2022-08-21 MED ORDER — MIDODRINE HCL 5 MG PO TABS
ORAL_TABLET | ORAL | Status: AC
  Filled 2022-08-21: qty 2

## 2022-08-21 MED ORDER — CARVEDILOL 6.25 MG PO TABS
6.2500 mg | ORAL_TABLET | Freq: Two times a day (BID) | ORAL | 11 refills | Status: DC
  Filled 2022-08-21: qty 60, 30d supply, fill #0

## 2022-08-21 MED ORDER — MIDODRINE HCL 5 MG PO TABS
10.0000 mg | ORAL_TABLET | Freq: Once | ORAL | Status: AC
  Administered 2022-08-21: 10 mg via ORAL

## 2022-08-21 MED ORDER — HEPARIN SODIUM (PORCINE) 1000 UNIT/ML IJ SOLN
INTRAMUSCULAR | Status: AC
  Filled 2022-08-21: qty 1

## 2022-08-21 MED ORDER — HEPARIN SODIUM (PORCINE) 1000 UNIT/ML DIALYSIS
3000.0000 [IU] | Freq: Once | INTRAMUSCULAR | Status: AC
  Administered 2022-08-21: 3000 [IU] via INTRAVENOUS_CENTRAL
  Filled 2022-08-21 (×2): qty 3

## 2022-08-21 NOTE — Progress Notes (Signed)
Received patient in bed to unit.  Alert and oriented.  Informed consent signed and in chart.   Treatment initiated: 0805 Treatment completed: 1228  Patient tolerated well. Normal saline bolus 100cc x 1 given and stopped uf for approximately 40 minutes  d/t sbp 80s.  Transported back to the room  Alert, without acute distress.  Hand-off given to patient's nurse.   Access used: fistula right upper arm  Access issues: none  Total UF removed: 1.9 liters Medication(s) given: midodrine, heparin, hectorol, tylenol  Post HD VS: 111/79 MAP 91 HR 81 RR 23 Sat 100% on room air Temp oral 98.1 Post HD weight: 119.3 kg bed wt   [facility]

## 2022-08-21 NOTE — Progress Notes (Signed)
Admit: 08/16/2022 LOS: 5  33M ESRD chronic HFrEF with VTAch Storm  Subjective:  No interval events For HD today  09/15 0701 - 09/16 0700 In: 480 [P.O.:480] Out: -   Filed Weights   08/17/22 1100 08/19/22 0720 08/19/22 1241  Weight: 119.1 kg 125.9 kg 122.3 kg    Scheduled Meds:  (feeding supplement) PROSource Plus  30 mL Oral BID BM   amiodarone  400 mg Oral BID   aspirin EC  81 mg Oral Daily   atorvastatin  80 mg Oral QHS   carvedilol  6.25 mg Oral BID   Chlorhexidine Gluconate Cloth  6 each Topical Daily   docusate sodium  100 mg Oral BID   doxercalciferol  7 mcg Intravenous Q T,Th,Sa-HD   heparin  5,000 Units Subcutaneous Q8H   heparin sodium (porcine)       midodrine       midodrine       mometasone-formoterol  2 puff Inhalation BID   multivitamin  1 tablet Oral QHS   sucroferric oxyhydroxide  1,000 mg Oral TID WC   Continuous Infusions: PRN Meds:.acetaminophen, albuterol, heparin sodium (porcine), melatonin, midodrine, midodrine, mouth rinse  Current Labs: reviewed    Physical Exam:  Blood pressure 114/61, pulse 78, temperature 99.6 F (37.6 C), temperature source Oral, resp. rate (!) 25, height $RemoveBe'5\' 7"'LLPsRoRJc$  (1.702 m), weight 122.3 kg, SpO2 98 %. NAD RRR CTAB RUE AVF +B/T No LEE S/nt/nd  Dialysis Orders:  TTS at [facility] 4:15hr, 450/A1.5, EDW 116.4kg (usually leaves ~117kg), 2K/2Ca, UFP #4, AVF, heparin 3000 unit bolus - Hectoral 53mcg IV q HD - no ESA, Hgb > 11  A Assessment/Plan:  VT storm: reloading on amio, resuming and uptitrating BB, no further events per EP/AHF; likely DC today  ESRD: On schedule, HD today. Bolus heparin.  Give midodrine given tendency towards IDH  Hypertension/volume BP often drops with dialysis. UF as tolerated with next HD.  Use BB to extent BPs permit.  Midodrine as above  Anemia: Hgb remains > 10, no ESA needed.  Metabolic bone disease: Ca ok, Continue binders + VDRA.  Nutrition:  Alb low, will protein supplements.  HFrEF, per  AHF   Pearson Grippe MD 08/21/2022, 9:15 AM  Recent Labs  Lab 08/19/22 0243 08/20/22 0732 08/21/22 0800  NA 139 137 137  K 4.6 4.4 4.4  CL 98 93* 95*  CO2 $Re'25 26 24  'sYg$ GLUCOSE 108* 104* 151*  BUN 82* 55* 74*  CREATININE 15.84* 12.16* 14.77*  CALCIUM 8.7* 9.3 8.9    Recent Labs  Lab 08/16/22 1257 08/19/22 0730 08/21/22 0800  WBC 6.7 6.3 6.3  HGB 12.5* 10.5* 10.5*  HCT 36.1* 30.5* 31.1*  MCV 97.8 96.2 98.1  PLT 205 171 160

## 2022-08-21 NOTE — Discharge Instructions (Signed)
Please take amiodarone 200 mg - 2 tablets (400 mg) twice daily for 14 days.  Then, on October 1 you will start taking 200 mg once daily.  Carvedilol has been increased to 6.25 mg twice daily.  I have sent a prescription to your pharmacy for amiodarone and carvedilol.  Take what you have at home.  It is very important that you not miss a dose of amiodarone as this will help prevent abnormal heart rhythms (ventricular tachycardia).  Please pick up your prescriptions on Monday.

## 2022-08-21 NOTE — Progress Notes (Signed)
Rounding Note    [name] Date of Encounter: 08/21/2022  [place] Cardiologist: Loralie Champagne, MD   Subjective   Currently feeling well.  No further ventricular arrhythmias since switching to p.o. amiodarone.  Inpatient Medications    Scheduled Meds:  midodrine       midodrine       (feeding supplement) PROSource Plus  30 mL Oral BID BM   amiodarone  400 mg Oral BID   aspirin EC  81 mg Oral Daily   atorvastatin  80 mg Oral QHS   carvedilol  6.25 mg Oral BID   Chlorhexidine Gluconate Cloth  6 each Topical Daily   docusate sodium  100 mg Oral BID   doxercalciferol  7 mcg Intravenous Q T,Th,Sa-HD   heparin  5,000 Units Subcutaneous Q8H   heparin sodium (porcine)       mometasone-formoterol  2 puff Inhalation BID   multivitamin  1 tablet Oral QHS   sucroferric oxyhydroxide  1,000 mg Oral TID WC   Continuous Infusions:    PRN Meds: midodrine, midodrine, acetaminophen, albuterol, heparin sodium (porcine), melatonin, mouth rinse   Vital Signs    Vitals:   08/21/22 0002 08/21/22 0403 08/21/22 0735 08/21/22 0801  BP: 100/67 105/60 (!) 107/55 123/62  Pulse: 78 78 78 79  Resp: 19 20 (!) 25 (!) 28  Temp: 99.2 F (37.3 C) 98.4 F (36.9 C) 99.6 F (37.6 C)   TempSrc: Axillary Axillary Oral   SpO2: 95% 94% 99% 97%  Weight:      Height:        Intake/Output Summary (Last 24 hours) at 08/21/2022 0904 Last data filed at 08/20/2022 2040 Gross per 24 hour  Intake 240 ml  Output --  Net 240 ml       08/19/2022   12:41 PM 08/19/2022    7:20 AM 08/17/2022   11:00 AM  Last 3 Weights  Weight (lbs) 269 lb 10 oz 277 lb 9 oz 262 lb 9.1 oz  Weight (kg) 122.3 kg 125.9 kg 119.1 kg      Telemetry    Sinus rhythm, PVCs  ECG    None new  Physical Exam   GEN: Well nourished, well developed, in no acute distress  HEENT: normal  Neck: no JVD, carotid bruits, or masses Cardiac: RRR; no murmurs, rubs, or gallops,no edema  Respiratory:   clear to auscultation bilaterally, normal work of breathing GI: soft, nontender, nondistended, + BS MS: no deformity or atrophy  Skin: warm and dry Neuro:  Strength and sensation are intact Psych: euthymic mood, full affect   Labs    High Sensitivity Troponin:   Recent Labs  Lab 08/16/22 0627  TROPONINIHS 53*      Chemistry Recent Labs  Lab 08/16/22 1004 08/17/22 0520 08/19/22 0243 08/20/22 0732 08/21/22 0800  NA  --    < > 139 137 137  K  --    < > 4.6 4.4 4.4  CL  --    < > 98 93* 95*  CO2  --    < > '25 26 24  '$ GLUCOSE  --    < > 108* 104* 151*  BUN  --    < > 82* 55* 74*  CREATININE 13.17*   < > 15.84* 12.16* 14.77*  CALCIUM  --    < > 8.7* 9.3 8.9  MG  --    < > 2.4 2.1 2.1  PROT 7.3  --   --   --   --  ALBUMIN 3.3*  --   --   --   --   AST 19  --   --   --   --   ALT 13  --   --   --   --   ALKPHOS 59  --   --   --   --   BILITOT 0.7  --   --   --   --   GFRNONAA 4*   < > 3* 4* 3*  ANIONGAP  --    < > 16* 18* 18*   < > = values in this interval not displayed.     Lipids  Recent Labs  Lab 08/16/22 1004  CHOL 230*  TRIG 195*  HDL 27*  LDLCALC 164*  CHOLHDL 8.5     Hematology Recent Labs  Lab 08/16/22 1257 08/19/22 0730 08/21/22 0800  WBC 6.7 6.3 6.3  RBC 3.69* 3.17* 3.17*  HGB 12.5* 10.5* 10.5*  HCT 36.1* 30.5* 31.1*  MCV 97.8 96.2 98.1  MCH 33.9 33.1 33.1  MCHC 34.6 34.4 33.8  RDW 16.0* 15.9* 15.8*  PLT 205 171 160    Thyroid  Recent Labs  Lab 08/16/22 1004  TSH 3.364     BNP Recent Labs  Lab 08/16/22 0627 08/16/22 1257  BNP QUANTITY NOT SUFFICIENT, UNABLE TO PERFORM TEST 399.2*     DDimer No results for input(s): "DDIMER" in the last 168 hours.   Radiology    NM CARDIAC AMYLOID TUMOR LOC INFLAM SPECT 1 DAY  Result Date: 08/20/2022 CLINICAL DATA:  Heart failure, cardiomyopathy, question cardiac amyloidosis. EXAM: NUCLEAR MEDICINE TUMOR LOCALIZATION. PYP CARDIAC AMYLOIDOSIS SCAN WITH SPECT TECHNIQUE: Following intravenous  administration of radiopharmaceutical, anterior planar images of the chest were obtained. Regions of interest were placed on the heart and contralateral chest wall for quantitative assessment. Additional SPECT imaging of the chest was obtained. RADIOPHARMACEUTICALS:  21.3 mCi Tc-56mpyrophosphate IV FINDINGS: Planar Visual assessment: Anterior planar imaging demonstrates no significant tracer uptake within the heart (grade 0). Quantitative assessment : Quantitative assessment of the cardiac uptake compared to the contralateral chest wall is equal to 0.94 (H/CL = 0.94). SPECT assessment: SPECT imaging of the chest demonstrates no radiotracer accumulation within the LEFT ventricle. However significant tracer localization is seen far anteriorly in the chest. IMPRESSION: Visual and quantitative assessment (grade 0, H/CL = 0.94) is not suggestive of transthyretin amyloidosis. Significant tracer localization in the far anterior chest at the expected position of the sternum/LEFT parasternal region. This may reflect sternal osseous uptake, presternal wound, or anterior chest wall trauma but anterior mediastinal uptake/mass not entirely excluded (though considered less likely); recommend CT imaging to determine etiology. Electronically Signed   By: MLavonia DanaM.D.   On: 08/20/2022 13:47    Cardiac Studies   08/20/22: PYP nuc med Visual and quantitative assessment (grade 0, H/CL = 0.94) is not suggestive of transthyretin amyloidosis  10/21/21: TTE  1. Left ventricular ejection fraction, by estimation, is 25 to 30%. The  left ventricle has severely decreased function. The left ventricle  demonstrates global hypokinesis. The left ventricular internal cavity size  was moderately dilated. Left  ventricular diastolic parameters are consistent with Grade I diastolic  dysfunction (impaired relaxation).   2. Right ventricular systolic function is normal. The right ventricular  size is normal. There is normal pulmonary  artery systolic pressure. The  estimated right ventricular systolic pressure is 381.1mmHg.   3. The mitral valve is normal in structure. Mild mitral valve  regurgitation. No evidence of mitral stenosis.   4. The aortic valve is tricuspid. Aortic valve regurgitation is not  visualized. No aortic stenosis is present.   5. The inferior vena cava is normal in size with greater than 50%  respiratory variability, suggesting right atrial pressure of 3 mmHg.    07/08/21; LHC 1. Normal filling pressures and normal cardiac output.  2. No significant CAD.  Nonischemic cardiomyopathy.   Patient Profile     61 y.o. male with a hx of  hx of VF arrest (2015), CAD (PCI in 2005, unknown details), ICM,. Chronic CHF (systolic), ESRF on HD , OSA w/CPAP, HTN, HLD, recurrent angio edema admitted 9/11 with VT storm.  CAD with prior PCI though cath Aug 2022 noted no obstructive disease Cardiolite 2017: Inferior and inferolateral large fixed defect suggestive of prior infarction, EF 28% Last Echo was Nov 2022, known reduced LVEF 25-30% (unchanged from 2021)     Device information BSCi S-ICD implanted  elsewhere 04/11/2014 > gen change 10/13/2020 + Hx of appropriate therapies Amiodarone started June 2023  ICD interrogation 08/16/22: Stable battery and electrode impedance 28 appropriate shocks  Assessment & Plan    1.  Ventricular tachycardia storm: Patient was off of amiodarone for multiple weeks.  Has had a IV amiodarone load.  Was switched to p.o. amiodarone yesterday.  No further ventricular arrhythmias.  We Victor Thompson plan to discharge today.  Discharge on 400 mg twice daily for 2 weeks followed by 200 mg daily.  Arrange for follow-up in EP clinic in 3 weeks with an app.  2.  Chronic systolic heart failure: Due to nonischemic cardiomyopathy.  Victor Thompson need follow-up in heart failure clinic for possible outpatient PET scan.  3.  End-stage renal disease: Currently on dialysis.   For questions or updates, please  contact [place] Please consult [email or web address] for contact info under   Victor Thompson Meredith Leeds, MD  08/21/2022, 9:04 AM

## 2022-08-21 NOTE — Discharge Summary (Addendum)
Discharge Summary    Patient ID: ROLLINS WRIGHTSON MRN: [id number]; DOB: 03/09/1961  Admit date: 08/16/2022 Discharge date: 08/21/2022  PCP:  [name], MD   [place] [facility] Cardiologist:  Loralie Champagne, MD  Electrophysiologist:  [name], MD       Discharge Diagnoses    Principal Problem:   VT (ventricular tachycardia) [facility]) Active Problems:   AICD discharge   HFrEF (heart failure with reduced ejection fraction) ([place])   ESRD on dialysis [facility])   Dilated cardiomyopathy ([place])   CAD (coronary artery disease), native coronary artery    Diagnostic Studies/Procedures    PYP Scan 08/19/22 IMPRESSION: Visual and quantitative assessment (grade 0, H/CL = 0.94) is not suggestive of transthyretin amyloidosis.   Significant tracer localization in the far anterior chest at the expected position of the sternum/LEFT parasternal region. This may reflect sternal osseous uptake, presternal wound, or anterior chest wall trauma but anterior mediastinal uptake/mass not entirely excluded (though considered less likely); recommend CT imaging to determine etiology.    _____________   History of Present Illness     OVIDE DUSEK is a 61 y.o. male with coronary artery disease s/p prior PCI in 2005 (details unknown; cath in 8/22 with no obstructive CAD), suspected nonischemic cardiomyopathy, (HFrEF) heart failure with reduced ejection fraction (EF 25-30), ventricular tachycardia (admitted for VT storm in June 2023), prior VF arrest in 2015, s/p subcutaneous ICD, amiodarone Rx, ESRD on hemodialysis, OSA on CPAP, hypertension, hyperlipidemia, recurrent angioedema.  He presented via EMS to the hospital on 08/16/2022 with multiple ICD shocks in the setting of VT storm.  Interrogation of his device confirmed 28 appropriate shocks.  He had been off of his amiodarone for a couple of weeks.    Hospital Course     Consultants:  Advanced Heart Failure (Dr. Haroldine Laws and Dr.  Aundra Dubin);  Nephrology (Dr. Joelyn Oms)  VT storm The patient was admitted and placed on IV amiodarone with bolus.  His rhythm remained stable and he was eventually transition to oral amiodarone.  Given his presentation with VT storm and being off of his amiodarone for 2 weeks, he was kept in the hospital for continued load.  However, he had episodes of nonsustained ventricular tachycardia.  Therefore, he was switched back to IV amiodarone for 24 hours.  He was switched back to oral amiodarone on 08/20/2022.  He was evaluated by Dr. Curt Bears this morning and noted to be in stable condition ready for discharge to home.  The patient Ascencion Coye need to go home on amiodarone 400 mg twice daily for 2 weeks followed by 200 mg daily.  He has a post hospital follow-up scheduled with Oda Kilts, PA-C 09/02/2022 at 10:40 AM.  HFrEF He was seen by Dr. Haroldine Laws with the advanced heart failure team for management of his congestive heart failure.  He was noted to be off of his carvedilol at home and this was restarted.  He had also been off of his hydralazine and isosorbide.  These were held secondary to low blood pressure.  Cardiac MRI was arranged (device compatible) to look for infiltrative disease/sarcoidosis.  However, this could not be completed due to too much artifact from his device.  Plan is to possibly proceed with cardiac PET at [facility] to assess for cardiac sarcoid.  PYP scan during this admission was not suggestive of TTR amyloid.  He has a follow-up with the CHF clinic 09/03/2022 at 2:30 PM.  Coronary artery disease Troponins were not significantly elevated.  His presentation was not felt to be secondary to ACS.  ESRD on hemodialysis He was also seen by Dr. Joelyn Oms with nephrology to continue management of his end-stage renal disease with dialysis.    Did the patient have an acute coronary syndrome (MI, NSTEMI, STEMI, etc) this admission?:  No.   The elevated Troponin was due to the acute medical illness (demand  ischemia).         _____________  Discharge Vitals Blood pressure 111/79, pulse 75, temperature 98.1 F (36.7 C), temperature source Oral, resp. rate 16, height '5\' 7"'$  (1.702 m), weight 122.3 kg, SpO2 100 %.  Filed Weights   08/17/22 1100 08/19/22 0720 08/19/22 1241  Weight: 119.1 kg 125.9 kg 122.3 kg    Labs & Radiologic Studies    CBC Recent Labs    08/19/22 0730 08/21/22 0800  WBC 6.3 6.3  HGB 10.5* 10.5*  HCT 30.5* 31.1*  MCV 96.2 98.1  PLT 171 720   Basic Metabolic Panel Recent Labs    08/20/22 0732 08/21/22 0800  NA 137 137  137  K 4.4 4.4  4.4  CL 93* 95*  95*  CO2 '26 24  24  '$ GLUCOSE 104* 150*  151*  BUN 55* 75*  74*  CREATININE 12.16* 14.77*  14.77*  CALCIUM 9.3 8.9  8.9  MG 2.1 2.1  PHOS  --  7.0*   Liver Function Tests Recent Labs    08/21/22 0800  ALBUMIN 3.1*   AST  Date/Time Value Ref Range Status  08/16/2022 10:04 AM 19 15 - 41 U/L Final   ALT  Date/Time Value Ref Range Status  08/16/2022 10:04 AM 13 0 - 44 U/L Final   Alkaline Phosphatase  Date/Time Value Ref Range Status  08/16/2022 10:04 AM 59 38 - 126 U/L Final   LIPIDS Cholesterol  Date/Time Value Ref Range Status  08/16/2022 10:04 AM 230 (H) 0 - 200 mg/dL Final   LDL Cholesterol  Date/Time Value Ref Range Status  08/16/2022 10:04 AM 164 (H) 0 - 99 mg/dL Final    Comment:           Total Cholesterol/HDL:CHD Risk Coronary Heart Disease Risk Table                     Men   Women  1/2 Average Risk   3.4   3.3  Average Risk       5.0   4.4  2 X Average Risk   9.6   7.1  3 X Average Risk  23.4   11.0        Use the calculated Patient Ratio above and the CHD Risk Table to determine the patient's CHD Risk.        ATP III CLASSIFICATION (LDL):  <100     mg/dL   Optimal  100-129  mg/dL   Near or Above                    Optimal  130-159  mg/dL   Borderline  160-189  mg/dL   High  >190     mg/dL   Very High Performed at [facility] [address],  [place], [place] [address]    Triglycerides  Date/Time Value Ref Range Status  08/16/2022 10:04 AM 195 (H) <150 mg/dL Final    High Sensitivity Troponin:   Recent Labs  Lab 08/16/22 0627  TROPONINIHS 53*    B Natriuretic Peptide  Date/Time Value  Ref Range Status  08/16/2022 12:57 PM 399.2 (H) 0.0 - 100.0 pg/mL Final    Comment:    Performed at [facility], [place] [address] [place], [place] [address]     _____________  NM CARDIAC AMYLOID TUMOR LOC INFLAM SPECT 1 DAY  Result Date: 08/20/2022 CLINICAL DATA:  Heart failure, cardiomyopathy, question cardiac amyloidosis. EXAM: NUCLEAR MEDICINE TUMOR LOCALIZATION. PYP CARDIAC AMYLOIDOSIS SCAN WITH SPECT TECHNIQUE: Following intravenous administration of radiopharmaceutical, anterior planar images of the chest were obtained. Regions of interest were placed on the heart and contralateral chest wall for quantitative assessment. Additional SPECT imaging of the chest was obtained. RADIOPHARMACEUTICALS:  21.3 mCi Tc-80mpyrophosphate IV FINDINGS: Planar Visual assessment: Anterior planar imaging demonstrates no significant tracer uptake within the heart (grade 0). Quantitative assessment : Quantitative assessment of the cardiac uptake compared to the contralateral chest wall is equal to 0.94 (H/CL = 0.94). SPECT assessment: SPECT imaging of the chest demonstrates no radiotracer accumulation within the LEFT ventricle. However significant tracer localization is seen far anteriorly in the chest. IMPRESSION: Visual and quantitative assessment (grade 0, H/CL = 0.94) is not suggestive of transthyretin amyloidosis. Significant tracer localization in the far anterior chest at the expected position of the sternum/LEFT parasternal region. This may reflect sternal osseous uptake, presternal wound, or anterior chest wall trauma but anterior mediastinal uptake/mass not entirely excluded (though considered less likely); recommend CT imaging to determine etiology.  Electronically Signed   By: MLavonia DanaM.D.   On: 08/20/2022 13:47   DG Chest Port 1 View  Result Date: 08/16/2022 CLINICAL DATA:  Chest pain. EXAM: PORTABLE CHEST 1 VIEW COMPARISON:  05/25/2022 FINDINGS: 0611 hours. The cardio pericardial silhouette is enlarged. There is pulmonary vascular congestion without overt pulmonary edema. Left-sided ICD again noted. Telemetry leads overlie the chest. IMPRESSION: Enlargement of the cardiopericardial silhouette with pulmonary vascular congestion. Electronically Signed   By: EMisty StanleyM.D.   On: 08/16/2022 06:23    Disposition   Pt is being discharged home today in good condition.  Follow-up Plans & Appointments     Follow-up Information     [facility] Follow up.   Specialty: Cardiology Why: 09/03/22 at 2:30 PM  [facility](Dr. MClaris GladdenOffice)  EBryan Lemmainformation: [address][id number]       TShirley Friar PA-C Follow up on 09/02/2022.   Specialty: Physician Assistant Why: 10:40 am Contact information: [id number]               Discharge Instructions     ([place] MD:  Anytime you have any of the following symptoms: 1) 3 pound weight gain in 24 hours or 5 pounds in 1 week 2) shortness of breath, with or without a dry hacking cough 3) swelling in the hands, feet or stomach 4) if you have to sleep on extra pillows at night in order to breathe.   Complete by: As directed    Diet - low sodium heart healthy   Complete by: As directed    Driving Restrictions   Complete by: As directed    No driving   Increase activity slowly   Complete by: As directed    No wound care   Complete by: As directed        Discharge Medications  Allergies as of 08/21/2022       Reactions   Ace  Inhibitors Swelling   Swelling of the tongue   Influenza Vaccines Hives, Swelling   SWELLING REACTION UNSPECIFIED    Ketorolac Swelling   SWELLING REACTION UNSPECIFIED    Lidocaine Swelling   TONGUE SWELLS   Lisinopril Swelling   TONGUE SWELLING Pt reported problem with a BP med which sounded like  lisinopril  But as of 09/19/06,pt had tolerated altace without problem   Penicillins Swelling   TONGUE SWELLS Has patient had a PCN reaction causing immediate rash, facial/tongue/throat swelling, SOB or lightheadedness with hypotension: Yes Has patient had a PCN reaction causing severe rash involving mucus membranes or skin necrosis: No Has patient had a PCN reaction that required hospitalization No Has patient had a PCN reaction occurring within the last 10 years: Yes If all of the above answers are "NO", then may proceed with Cephalosporin use.        Medication List     STOP taking these medications    hydrALAZINE 25 MG tablet Commonly known as: APRESOLINE   isosorbide mononitrate 60 MG 24 hr tablet Commonly known as: IMDUR       TAKE these medications    albuterol 108 (90 Base) MCG/ACT inhaler Commonly known as: VENTOLIN HFA Inhale 2 puffs into the lungs every 4 (four) hours as needed for wheezing or shortness of breath.   albuterol (2.5 MG/3ML) 0.083% nebulizer solution Commonly known as: PROVENTIL TAKE 3 MLS (2.5 MG TOTAL) BY NEBULIZATION EVERY 6 (SIX) HOURS AS NEEDED FOR WHEEZING OR SHORTNESS OF BREATH.   amiodarone 200 MG tablet Commonly known as: PACERONE Take 2 tabs (400 mg) twice daily for 14 days (now through Sep 30). Then take 1 tab (200 mg) once daily (starting Oct 1) What changed:  how much to take how to take this when to take this additional instructions   aspirin EC 81 MG tablet Take 1 tablet (81 mg total) by mouth daily.   atorvastatin 80 MG tablet Commonly known as: LIPITOR Take 1 tablet (80 mg total) by mouth at bedtime.   Auryxia 1 GM 210  MG(Fe) tablet Generic drug: ferric citrate Take 1 tablet (210 mg total) by mouth 3 (three) times daily.   calcium acetate 667 MG capsule Commonly known as: PHOSLO Take 2 capsule by mouth three times a day with meals   Calmol-4 76-10 % Supp Use as directed once to twice daily   carvedilol 6.25 MG tablet Commonly known as: Coreg Take 1 tablet (6.25 mg total) by mouth 2 (two) times daily. What changed: how much to take   cetirizine 10 MG tablet Commonly known as: ZYRTEC Take 1 tablet (10 mg total) by mouth 2 (two) times daily.   docusate sodium 100 MG capsule Commonly known as: Colace Take 1 capsule (100 mg total) by mouth 2 (two) times daily.   EPINEPHrine 0.3 mg/0.3 mL Soaj injection Commonly known as: EPI-PEN Inject 0.3 mg into the muscle once as needed for up to 2 doses (if worsening tongue swelling, SOB, hypoxia, or other concerns for progressive anaphylaxis).   ethyl chloride spray SPRAY A SMALL AMOUNT THREE TIMES A WEEK JUST PRIOR TO NEEDLE INSERTION   fluticasone 50 MCG/ACT nasal spray Commonly known as: FLONASE Place into both nostrils as needed for allergies or rhinitis.   polyethylene glycol 17 g packet Commonly known as: MiraLax Take 17 g by mouth daily. Titrate up as needed   Symbicort 80-4.5 MCG/ACT inhaler  Generic drug: budesonide-formoterol INHALE TWO PUFFS INTO THE LUNGS TWICE DAILY (BULK)   Velphoro 500 MG chewable tablet Generic drug: sucroferric oxyhydroxide Chew 1,000 mg by mouth 3 (three) times daily.   Vitamin D-3 125 MCG (5000 UT) Tabs Take 5,000 Units by mouth daily.           Outstanding Labs/Studies   None  Duration of Discharge Encounter   Greater than 30 minutes including physician time.  Signed, [name], PA-C 08/21/2022, 1:22 PM    I have seen and examined this patient with [name].  Agree with above, note added to reflect my findings.  Patient initially presented to the hospital with multiple shocks from his S  ICD.  He has a history of chronic systolic heart failure.  He was previously on amiodarone, but he stopped it multiple weeks ago.  Amiodarone has been loaded.  He has not had any further ICD shocks.  Has been getting dialysis.  We Neomi Laidler plan for discharge today with follow-up in clinic.  GEN: Well nourished, well developed, in no acute distress  HEENT: normal  Neck: no JVD, carotid bruits, or masses Cardiac: RRR; no murmurs, rubs, or gallops,no edema  Respiratory:  clear to auscultation bilaterally, normal work of breathing GI: soft, nontender, nondistended, + BS MS: no deformity or atrophy  Skin: warm and dry Neuro:  Strength and sensation are intact Psych: euthymic mood, full affect     Keelon Zurn M. Katrenia Alkins MD 08/21/2022 2:00 PM

## 2022-08-23 ENCOUNTER — Ambulatory Visit: Payer: Self-pay | Admitting: *Deleted

## 2022-08-23 ENCOUNTER — Ambulatory Visit (INDEPENDENT_AMBULATORY_CARE_PROVIDER_SITE_OTHER): Payer: Medicare Other | Admitting: Physician Assistant

## 2022-08-23 ENCOUNTER — Ambulatory Visit (HOSPITAL_COMMUNITY): Admission: RE | Admit: 2022-08-23 | Payer: Medicare Other | Source: Home / Self Care | Admitting: Gastroenterology

## 2022-08-23 ENCOUNTER — Telehealth: Payer: Self-pay

## 2022-08-23 ENCOUNTER — Encounter: Payer: Self-pay | Admitting: Physician Assistant

## 2022-08-23 ENCOUNTER — Other Ambulatory Visit: Payer: Self-pay

## 2022-08-23 ENCOUNTER — Encounter (HOSPITAL_COMMUNITY): Admission: RE | Payer: Self-pay | Source: Home / Self Care

## 2022-08-23 ENCOUNTER — Telehealth: Payer: Self-pay | Admitting: Nurse Practitioner

## 2022-08-23 VITALS — BP 118/57 | HR 57 | Ht 67.0 in | Wt 264.0 lb

## 2022-08-23 DIAGNOSIS — F418 Other specified anxiety disorders: Secondary | ICD-10-CM

## 2022-08-23 DIAGNOSIS — Z6841 Body Mass Index (BMI) 40.0 and over, adult: Secondary | ICD-10-CM

## 2022-08-23 DIAGNOSIS — I2510 Atherosclerotic heart disease of native coronary artery without angina pectoris: Secondary | ICD-10-CM

## 2022-08-23 DIAGNOSIS — I5020 Unspecified systolic (congestive) heart failure: Secondary | ICD-10-CM | POA: Diagnosis not present

## 2022-08-23 SURGERY — COLONOSCOPY WITH PROPOFOL
Anesthesia: Monitor Anesthesia Care

## 2022-08-23 MED ORDER — HYDROXYZINE HCL 25 MG PO TABS
25.0000 mg | ORAL_TABLET | Freq: Every evening | ORAL | 0 refills | Status: DC | PRN
  Filled 2022-08-23: qty 30, 15d supply, fill #0

## 2022-08-23 NOTE — Telephone Encounter (Signed)
   Reason for Disposition . MODERATE anxiety (e.g., persistent or frequent anxiety symptoms; interferes with sleep, school, or work)  Answer Assessment - Initial Assessment Questions 1. CONCERN: "Did anything happen that prompted you to call today?"      Anxiety- was recently in the hospital 5 days- patient was shocked with defibrillator- unable to sleep 2. ANXIETY SYMPTOMS: "Can you describe how you (your loved one; patient) have been feeling?" (e.g., tense, restless, panicky, anxious, keyed up, overwhelmed, sense of impending doom).      Unable to sleep 3. ONSET: "How long have you been feeling this way?" (e.g., hours, days, weeks)     Patient gets anxious - when sleeping- mostly at night 4. SEVERITY: "How would you rate the level of anxiety?" (e.g., 0 - 10; or mild, moderate, severe).     7 5. FUNCTIONAL IMPAIRMENT: "How have these feelings affected your ability to do daily activities?" "Have you had more difficulty than usual doing your normal daily activities?" (e.g., getting better, same, worse; self-care, school, work, interactions)     Unable to sleep at night- melatonin helps but does not last 6. HISTORY: "Have you felt this way before?" "Have you ever been diagnosed with an anxiety problem in the past?" (e.g., generalized anxiety disorder, panic attacks, PTSD). If Yes, ask: "How was this problem treated?" (e.g., medicines, counseling, etc.)     Shocked 28 times- patient thinks he will gets shocked 7. RISK OF HARM - SUICIDAL IDEATION: "Do you ever have thoughts of hurting or killing yourself?" If Yes, ask:  "Do you have these feelings now?" "Do you have a plan on how you would do this?"     No thoughts 8. TREATMENT:  "What has been done so far to treat this anxiety?" (e.g., medicines, relaxation strategies). "What has helped?"     Patient have distract himself 9. TREATMENT - THERAPIST: "Do you have a counselor or therapist? Name?"     no 10. POTENTIAL TRIGGERS: "Do you drink  caffeinated beverages (e.g., coffee, colas, teas), and how much daily?" "Do you drink alcohol or use any drugs?" "Have you started any new medicines recently?"       No, no drugs/alcohol 11. PATIENT SUPPORT: "Who is with you now?" "Who do you live with?" "Do you have family or friends who you can talk to?"        Patient has support- friends 73. OTHER SYMPTOMS: "Do you have any other symptoms?" (e.g., feeling depressed, trouble concentrating, trouble sleeping, trouble breathing, palpitations or fast heartbeat, chest pain, sweating, nausea, or diarrhea)         13. PREGNANCY: "Is there any chance you are pregnant?" "When was your last menstrual period?"  Protocols used: Anxiety and Panic Attack-A-AH

## 2022-08-23 NOTE — Patient Instructions (Signed)
To help with your anxiety and difficulty sleeping, you are going to use hydroxyzine at bedtime.  You can use 1 to 2 tablets, they are breakable in case 1-1/2 tablets is sufficient.  I do encourage you to work diligently over the next few days using some calming affirmations to help remind your brain that you are taking your medication on a daily basis, you have not had a shock in over a week, you are safe.  I encourage you to check your blood pressure at home on a daily basis, keep a written log and have available for all office visits.  I hope that you feel better soon, please let us know if there is anything else we can do for you  Kennieth Rad, PA-C Physician Assistant [place] [email or web address]   Managing Anxiety, Adult After being diagnosed with anxiety, you may be relieved to know why you have felt or behaved a certain way. You may also feel overwhelmed about the treatment ahead and what it will mean for your life. With care and support, you can manage this condition. How to manage lifestyle changes Managing stress and anxiety  Stress is your body's reaction to life changes and events, both good and bad. Most stress will last just a few hours, but stress can be ongoing and can lead to more than just stress. Although stress can play a major role in anxiety, it is not the same as anxiety. Stress is usually caused by something external, such as a deadline, test, or competition. Stress normally passes after the triggering event has ended.  Anxiety is caused by something internal, such as imagining a terrible outcome or worrying that something will go wrong that will devastate you. Anxiety often does not go away even after the triggering event is over, and it can become long-term (chronic) worry. It is important to understand the differences between stress and anxiety and to manage your stress effectively so that it does not lead to  an anxious response. Talk with your health care provider or a counselor to learn more about reducing anxiety and stress. He or she may suggest tension reduction techniques, such as: Music therapy. Spend time creating or listening to music that you enjoy and that inspires you. Mindfulness-based meditation. Practice being aware of your normal breaths while not trying to control your breathing. It can be done while sitting or walking. Centering prayer. This involves focusing on a word, phrase, or sacred image that means something to you and brings you peace. Deep breathing. To do this, expand your stomach and inhale slowly through your nose. Hold your breath for 3-5 seconds. Then exhale slowly, letting your stomach muscles relax. Self-talk. Learn to notice and identify thought patterns that lead to anxiety reactions and change those patterns to thoughts that feel peaceful. Muscle relaxation. Taking time to tense muscles and then relax them. Choose a tension reduction technique that fits your lifestyle and personality. These techniques take time and practice. Set aside 5-15 minutes a day to do them. Therapists can offer counseling and training in these techniques. The training to help with anxiety may be covered by some insurance plans. Other things you can do to manage stress and anxiety include: Keeping a stress diary. This can help you learn what triggers your reaction and then learn ways to manage your response. Thinking about how you react to certain situations. You may not be able to control everything, but you can control your response. Making time for activities  that help you relax and not feeling guilty about spending your time in this way. Doing visual imagery. This involves imagining or creating mental pictures to help you relax. Practicing yoga. Through yoga poses, you can lower tension and promote relaxation.  Medicines Medicines can help ease symptoms. Medicines for anxiety  include: Antidepressant medicines. These are usually prescribed for long-term daily control. Anti-anxiety medicines. These may be added in severe cases, especially when panic attacks occur. Medicines will be prescribed by a health care provider. When used together, medicines, psychotherapy, and tension reduction techniques may be the most effective treatment. Relationships Relationships can play a big part in helping you recover. Try to spend more time connecting with trusted friends and family members. Consider going to couples counseling if you have a partner, taking family education classes, or going to family therapy. Therapy can help you and others better understand your condition. How to recognize changes in your anxiety Everyone responds differently to treatment for anxiety. Recovery from anxiety happens when symptoms decrease and stop interfering with your daily activities at home or work. This may mean that you will start to: Have better concentration and focus. Worry will interfere less in your daily thinking. Sleep better. Be less irritable. Have more energy. Have improved memory. It is also important to recognize when your condition is getting worse. Contact your health care provider if your symptoms interfere with home or work and you feel like your condition is not improving. Follow these instructions at home: Activity Exercise. Adults should do the following: Exercise for at least 150 minutes each week. The exercise should increase your heart rate and make you sweat (moderate-intensity exercise). Strengthening exercises at least twice a week. Get the right amount and quality of sleep. Most adults need 7-9 hours of sleep each night. Lifestyle  Eat a healthy diet that includes plenty of vegetables, fruits, whole grains, low-fat dairy products, and lean protein. Do not eat a lot of foods that are high in fats, added sugars, or salt (sodium). Make choices that simplify your  life. Do not use any products that contain nicotine or tobacco. These products include cigarettes, chewing tobacco, and vaping devices, such as e-cigarettes. If you need help quitting, ask your health care provider. Avoid caffeine, alcohol, and certain over-the-counter cold medicines. These may make you feel worse. Ask your pharmacist which medicines to avoid. General instructions Take over-the-counter and prescription medicines only as told by your health care provider. Keep all follow-up visits. This is important. Where to find support You can get help and support from these sources: Self-help groups. Online and [employer]. A trusted spiritual leader. Couples counseling. Family education classes. Family therapy. Where to find more information You may find that joining a support group helps you deal with your anxiety. The following sources can help you locate counselors or support groups near you: [place]: www.mentalhealthamerica.net Anxiety and Depression Association of [place] (ADAA): https://www.clark.net/ National Alliance on Mental Illness (NAMI): www.nami.org Contact a health care provider if: You have a hard time staying focused or finishing daily tasks. You spend many hours a day feeling worried about everyday life. You become exhausted by worry. You start to have headaches or frequently feel tense. You develop chronic nausea or diarrhea. Get help right away if: You have a racing heart and shortness of breath. You have thoughts of hurting yourself or others. If you ever feel like you may hurt yourself or others, or have thoughts about taking your own life, get help right  away. Go to your nearest emergency department or: Call your local emergency services (911 in the [place] Call a suicide crisis helpline, such as the [place] at 479-824-5192 or 988 in the [place]. This is open 24 hours a day in the [place] Text the Crisis Text Line at  (254)244-3832 (in the [place].). Summary Taking steps to learn and use tension reduction techniques can help calm you and help prevent triggering an anxiety reaction. When used together, medicines, psychotherapy, and tension reduction techniques may be the most effective treatment. Family, friends, and partners can play a big part in supporting you. This information is not intended to replace advice given to you by your health care provider. Make sure you discuss any questions you have with your health care provider. Document Revised: 06/17/2021 Document Reviewed: 03/15/2021 Elsevier Patient Education  [place].

## 2022-08-23 NOTE — Telephone Encounter (Signed)
Per epic patient has been seen at mobile unit.

## 2022-08-23 NOTE — Telephone Encounter (Signed)
Transition Care Management Follow-up Telephone Call Date of discharge and from where: 08/21/2022, [facility] How have you been since you were released from the hospital? He said he is tired during the day and if he sleeps during the day, he can't sleep at night. He said he also very anxious about his health and was seen by Carrolyn Meiers, PA today for the anxiety.  He has the hydralazine that she prescribed and he plans to start taking that tonight.  Any questions or concerns? Yes- noted above   Items Reviewed: Did the pt receive and understand the discharge instructions provided? Yes  Medications obtained and verified? Yes - he said he has all of his medications Other? No  Any new allergies since your discharge? No  Dietary orders reviewed? No Do you have support at home? Yes   Home Care and Equipment/Supplies: Were home health services ordered? no If so, what is the name of the agency? N/a  Has the agency set up a time to come to the patient's home? not applicable Were any new equipment or medical supplies ordered?  No What is the name of the medical supply agency? N/a Were you able to get the supplies/equipment? not applicable Do you have any questions related to the use of the equipment or supplies? No  Functional Questionnaire: (I = Independent and D = Dependent) ADLs: independent.  Attends HD: T/T/S   Follow up appointments reviewed:  PCP Hospital f/u appt confirmed? Yes  Scheduled to see Dr [name] - 09/16/2022.  [facility] f/u appt confirmed? Yes  Scheduled to see cardiology- 09/02/2022.  Are transportation arrangements needed? No  If their condition worsens, is the pt aware to call PCP or go to the Emergency Dept.? Yes Was the patient provided with contact information for the PCP's office or ED? Yes Was to pt encouraged to call back with questions or concerns? Yes

## 2022-08-23 NOTE — Telephone Encounter (Signed)
Transition of care contact from inpatient facility  Date of discharge:  Date of contact:  Method: Phone Spoke to: Patient  Patient contacted to discuss transition of care from recent inpatient hospitalization. Patient was admitted to [facility] from 09/11-09/16/2023 with discharge diagnosis of Ventricular Tachycardia.   Medication changes were reviewed.  Patient will follow up with his/her outpatient HD unit on: 08/24/2022.

## 2022-08-23 NOTE — Progress Notes (Signed)
Established Patient Office Visit  Subjective   Patient ID: Victor Thompson, male    DOB: 06-01-1961  Age: 61 y.o. MRN: [id number]  Chief Complaint  Patient presents with   Anxiety    States that he has been having increased anxiety and difficulty sleeping ever since he was hospitalized for recurring ventricle tachycardia and his defibrillator discharged many times.  States that he has heightened awareness of his health and is worried that his defibrillator is going to "discharge at any time"  States that he has been having difficulty sleeping both falling asleep and staying asleep, states that he is only sleeping approximately 4 hours a night.  Has not tried anything for relief.  Does endorse that he was "stubborn" about taking his medication previously, states that he had not taken his medications for approximately 2 weeks prior to his recent hospitalization.  States that he does plan on being compliant to the medication on a daily basis.  States that he does not check his blood pressure at home, but does endorse he does have a blood pressure cuff.    Past Medical History:  Diagnosis Date   Acute on chronic systolic CHF (congestive heart failure) ([place]) 06/14/2020   Acute respiratory failure ([place]) 05/28/2022   AICD (automatic cardioverter/defibrillator) present    2015   Allergy    Asthma    no meds   Cardiac arrest ([place]) 2015   Chest pain 05/25/2022   CHF (congestive heart failure) (HCC)    Chronic kidney disease    ckd -stage 5   Coronary artery disease    Hyperlipidemia    Hypertension    Myocardial infarction ([place])    Obesity    Pneumonia    Pulmonary edema    Pulmonary edema 05/28/2022   Shortness of breath dyspnea    Sleep apnea    USES CPAP   Wears glasses    Social History   Socioeconomic History   Marital status: Single    Spouse name: Not on file   Number of children: 3   Years of education: Not on file   Highest education level: Not on file   Occupational History   Occupation: retired  Tobacco Use   Smoking status: Never   Smokeless tobacco: Never  Vaping Use   Vaping Use: Never used  Substance and Sexual Activity   Alcohol use: No   Drug use: No   Sexual activity: Yes  Other Topics Concern   Not on file  Social History Narrative   ** Merged History Encounter **       Social Determinants of Health   Financial Resource Strain: Not on file  Food Insecurity: No Food Insecurity (08/23/2022)   Hunger Vital Sign    Worried About Running Out of Food in the Last Year: Never true    Ran Out of Food in the Last Year: Never true  Transportation Needs: No Transportation Needs (08/23/2022)   PRAPARE - Hydrologist (Medical): No    Lack of Transportation (Non-Medical): No  Physical Activity: Insufficiently Active (08/20/2022)   Exercise Vital Sign    Days of Exercise per Week: 1 day    Minutes of Exercise per Session: 10 min  Stress: Not on file  Social Connections: Not on file  Intimate Partner Violence: Not At Risk (08/16/2022)   Humiliation, Afraid, Rape, and Kick questionnaire    Fear of Current or Ex-Partner: No    Emotionally Abused: No  Physically Abused: No    Sexually Abused: No   Family History  Problem Relation Age of Onset   Hypertension Mother    Liver disease Father    Colon cancer Neg Hx    Esophageal cancer Neg Hx    Rectal cancer Neg Hx    Stomach cancer Neg Hx    Allergies  Allergen Reactions   Ace Inhibitors Swelling    Swelling of the tongue   Influenza Vaccines Hives and Swelling    SWELLING REACTION UNSPECIFIED    Ketorolac Swelling    SWELLING REACTION UNSPECIFIED    Lidocaine Swelling    TONGUE SWELLS   Lisinopril Swelling    TONGUE SWELLING Pt reported problem with a BP med which sounded like  lisinopril  But as of 09/19/06,pt had tolerated altace without problem   Penicillins Swelling    TONGUE SWELLS Has patient had a PCN reaction causing immediate  rash, facial/tongue/throat swelling, SOB or lightheadedness with hypotension: Yes Has patient had a PCN reaction causing severe rash involving mucus membranes or skin necrosis: No Has patient had a PCN reaction that required hospitalization No Has patient had a PCN reaction occurring within the last 10 years: Yes If all of the above answers are "NO", then may proceed with Cephalosporin use.     Review of Systems  Constitutional: Negative.   HENT: Negative.    Eyes: Negative.   Respiratory:  Negative for shortness of breath.   Cardiovascular:  Negative for chest pain and palpitations.  Gastrointestinal: Negative.   Genitourinary: Negative.   Musculoskeletal: Negative.   Skin: Negative.   Neurological: Negative.   Endo/Heme/Allergies: Negative.   Psychiatric/Behavioral:  Negative for depression. The patient is nervous/anxious and has insomnia.    89/53 - 85/51   Objective:     BP (!) 118/57 (BP Location: Left Arm, Patient Position: Sitting)   Pulse (!) 57   Ht '5\' 7"'$  (1.702 m)   Wt 264 lb (119.7 kg)   SpO2 98%   BMI 41.35 kg/m  BP Readings from Last 3 Encounters:  08/23/22 (!) 118/57  08/21/22 102/72  06/23/22 110/76   Wt Readings from Last 3 Encounters:  08/23/22 264 lb (119.7 kg)  08/21/22 263 lb 0.1 oz (119.3 kg)  08/10/22 266 lb 12.1 oz (121 kg)      Physical Exam Vitals and nursing note reviewed.  Constitutional:      Appearance: Normal appearance. He is obese.  HENT:     Head: Normocephalic and atraumatic.     Right Ear: External ear normal.     Left Ear: External ear normal.     Nose: Nose normal.     Mouth/Throat:     Mouth: Mucous membranes are moist.     Pharynx: Oropharynx is clear.  Eyes:     Extraocular Movements: Extraocular movements intact.     Conjunctiva/sclera: Conjunctivae normal.     Pupils: Pupils are equal, round, and reactive to light.  Cardiovascular:     Rate and Rhythm: Normal rate and regular rhythm.     Pulses: Normal pulses.      Heart sounds: Normal heart sounds.  Pulmonary:     Effort: Pulmonary effort is normal.     Breath sounds: Normal breath sounds.  Musculoskeletal:        General: Normal range of motion.     Cervical back: Normal range of motion and neck supple.  Skin:    General: Skin is warm and dry.  Neurological:  General: No focal deficit present.     Mental Status: He is alert and oriented to person, place, and time.  Psychiatric:        Attention and Perception: Attention normal.        Mood and Affect: Mood is anxious.        Speech: Speech normal.        Behavior: Behavior normal.        Thought Content: Thought content normal.        Cognition and Memory: Cognition and memory normal.        Judgment: Judgment normal.          Assessment & Plan:   Problem List Items Addressed This Visit       Cardiovascular and Mediastinum   HFrEF (heart failure with reduced ejection fraction) (HCC) (Chronic)   CAD (coronary artery disease), native coronary artery     Other   Class 3 severe obesity due to excess calories with serious comorbidity and body mass index (BMI) of 40.0 to 44.9 in adult [facility])   Other Visit Diagnoses     Anxiety about health    -  Primary   Relevant Medications   hydrOXYzine (ATARAX) 25 MG tablet     1. Anxiety about health Trial hydroxyzine.  Patient education given on coping skills.  Red flags given for prompt reevaluation. - hydrOXYzine (ATARAX) 25 MG tablet; Take 1-2 tablets (25-50 mg total) by mouth at bedtime as needed for anxiety/insomnia.  Dispense: 30 tablet; Refill: 0  2. Coronary artery disease involving native coronary artery of native heart without angina pectoris Continue follow-up with cardiology and heart failure clinic.  Patient given appointment for hospital follow-up with PCP.  3. HFrEF (heart failure with reduced ejection fraction) (HCC)   4. Class 3 severe obesity due to excess calories with serious comorbidity and body mass index  (BMI) of 40.0 to 44.9 in adult [facility])    I have reviewed the patient's medical history (PMH, PSH, Social History, Family History, Medications, and allergies) , and have been updated if relevant. I spent 25 minutes reviewing chart and  face to face time with patient.     Return for needs HFU appt with Dr. [name].    Loraine Grip Mayers, PA-C

## 2022-08-23 NOTE — Patient Outreach (Signed)
  Care Coordination [facility] Note Transition Care Management Follow-up Telephone Call Date of discharge and from where: 08/21/22 How have you been since you were released from the hospital? Patient voices he is doing okay. He admits that he has bene having a lot of anxiety/ he is worried and concerned about his heart and him having another cardiac event like he did.  Any questions or concerns? Yes-He saw MD today for follow up apt to discuss anxiety-new prescription ordered. Patient with some reservations about taking med-educated pt on med.   Items Reviewed: Did the pt receive and understand the discharge instructions provided? Yes  Medications obtained and verified? Yes  Other? Yes  Any new allergies since your discharge? No  Dietary orders reviewed? Yes Do you have support at home? Yes   Home Care and Equipment/Supplies: Were home health services ordered? no If so, what is the name of the agency? N/a  Has the agency set up a time to come to the patient's home? not applicable Were any new equipment or medical supplies ordered?  No What is the name of the medical supply agency? N/a Were you able to get the supplies/equipment? not applicable Do you have any questions related to the use of the equipment or supplies? No  Functional Questionnaire: (I = Independent and D = Dependent) ADLs: I  Bathing/Dressing- I  Meal Prep- I  Eating- I  Maintaining continence- I  Transferring/Ambulation- I  Managing Meds- I  Follow up appointments reviewed:  PCP Hospital f/u appt confirmed? Yes  Patient had mobile health visit today and  Scheduled to see PCP on 09/16/22 @ 2:30pm. [facility] f/u appt confirmed? Yes  Scheduled  on 09/03/22 @ 2:30pm. Are transportation arrangements needed? No  If their condition worsens, is the pt aware to call PCP or go to the Emergency Dept.? Yes Was the patient provided with contact information for the PCP's office or ED? Yes Was to pt encouraged to call  back with questions or concerns? Yes  SDOH assessments and interventions completed:   Yes  Care Coordination Interventions Activated:  Yes   Care Coordination Interventions:  Referred for Care Coordination Services:  RN Care Coordinator -pt already has appt scheduled for 08/27/22  Encounter Outcome:  Pt. Visit Completed    Enzo Montgomery, RN,BSN,CCM [place] Management Telephonic Care Management Coordinator Direct Phone: [phone] Toll Free: [phone] Fax: [phone]

## 2022-08-23 NOTE — Telephone Encounter (Signed)
  Chief Complaint: anxiety Symptoms: recent hospitalization with multiple shocks from defibrillator- can't sleep for fear of being shocked. Frequency: since hospital release Pertinent Negatives: Patient denies suicidal thoughts Disposition: '[]'$ ED /'[]'$ Urgent Care (no appt availability in office) / '[x]'$ Appointment(In office/virtual)/ '[]'$  [facility]/ '[]'$ Home Care/ '[]'$ Refused Recommended Disposition /'[]'$ [facility]/ '[]'$  Follow-up with PCP Additional Notes: Patient will nedd hospital follow up- but anxiety is main issue today- appointment scheduled

## 2022-08-24 ENCOUNTER — Inpatient Hospital Stay (HOSPITAL_COMMUNITY): Payer: Medicare Other

## 2022-08-24 ENCOUNTER — Inpatient Hospital Stay (HOSPITAL_COMMUNITY)
Admission: EM | Admit: 2022-08-24 | Discharge: 2022-09-10 | DRG: 286 | Disposition: A | Discharge status: Home Health | Payer: Medicare Other | Source: Ambulatory Visit | Attending: Internal Medicine | Admitting: Internal Medicine

## 2022-08-24 ENCOUNTER — Encounter (HOSPITAL_COMMUNITY): Payer: Self-pay | Admitting: Cardiology

## 2022-08-24 ENCOUNTER — Emergency Department (HOSPITAL_COMMUNITY): Payer: Medicare Other

## 2022-08-24 DIAGNOSIS — I272 Pulmonary hypertension, unspecified: Secondary | ICD-10-CM | POA: Diagnosis not present

## 2022-08-24 DIAGNOSIS — K5900 Constipation, unspecified: Secondary | ICD-10-CM | POA: Diagnosis not present

## 2022-08-24 DIAGNOSIS — I4892 Unspecified atrial flutter: Secondary | ICD-10-CM | POA: Diagnosis not present

## 2022-08-24 DIAGNOSIS — J984 Other disorders of lung: Secondary | ICD-10-CM | POA: Diagnosis not present

## 2022-08-24 DIAGNOSIS — N19 Unspecified kidney failure: Secondary | ICD-10-CM | POA: Diagnosis not present

## 2022-08-24 DIAGNOSIS — R570 Cardiogenic shock: Secondary | ICD-10-CM | POA: Diagnosis present

## 2022-08-24 DIAGNOSIS — R001 Bradycardia, unspecified: Secondary | ICD-10-CM | POA: Diagnosis not present

## 2022-08-24 DIAGNOSIS — I2510 Atherosclerotic heart disease of native coronary artery without angina pectoris: Secondary | ICD-10-CM | POA: Diagnosis not present

## 2022-08-24 DIAGNOSIS — T380X5A Adverse effect of glucocorticoids and synthetic analogues, initial encounter: Secondary | ICD-10-CM | POA: Diagnosis not present

## 2022-08-24 DIAGNOSIS — I499 Cardiac arrhythmia, unspecified: Secondary | ICD-10-CM | POA: Diagnosis not present

## 2022-08-24 DIAGNOSIS — Z8674 Personal history of sudden cardiac arrest: Secondary | ICD-10-CM

## 2022-08-24 DIAGNOSIS — Z4659 Encounter for fitting and adjustment of other gastrointestinal appliance and device: Secondary | ICD-10-CM | POA: Diagnosis not present

## 2022-08-24 DIAGNOSIS — I472 Ventricular tachycardia, unspecified: Secondary | ICD-10-CM | POA: Diagnosis not present

## 2022-08-24 DIAGNOSIS — R739 Hyperglycemia, unspecified: Secondary | ICD-10-CM | POA: Diagnosis not present

## 2022-08-24 DIAGNOSIS — I252 Old myocardial infarction: Secondary | ICD-10-CM

## 2022-08-24 DIAGNOSIS — R1110 Vomiting, unspecified: Secondary | ICD-10-CM | POA: Diagnosis not present

## 2022-08-24 DIAGNOSIS — I5022 Chronic systolic (congestive) heart failure: Secondary | ICD-10-CM

## 2022-08-24 DIAGNOSIS — J45909 Unspecified asthma, uncomplicated: Secondary | ICD-10-CM | POA: Diagnosis not present

## 2022-08-24 DIAGNOSIS — I132 Hypertensive heart and chronic kidney disease with heart failure and with stage 5 chronic kidney disease, or end stage renal disease: Secondary | ICD-10-CM | POA: Diagnosis present

## 2022-08-24 DIAGNOSIS — R079 Chest pain, unspecified: Secondary | ICD-10-CM | POA: Diagnosis not present

## 2022-08-24 DIAGNOSIS — I509 Heart failure, unspecified: Secondary | ICD-10-CM | POA: Diagnosis not present

## 2022-08-24 DIAGNOSIS — J90 Pleural effusion, not elsewhere classified: Secondary | ICD-10-CM | POA: Diagnosis not present

## 2022-08-24 DIAGNOSIS — I5021 Acute systolic (congestive) heart failure: Secondary | ICD-10-CM | POA: Diagnosis not present

## 2022-08-24 DIAGNOSIS — E785 Hyperlipidemia, unspecified: Secondary | ICD-10-CM | POA: Diagnosis not present

## 2022-08-24 DIAGNOSIS — Z6841 Body Mass Index (BMI) 40.0 and over, adult: Secondary | ICD-10-CM | POA: Diagnosis not present

## 2022-08-24 DIAGNOSIS — G4733 Obstructive sleep apnea (adult) (pediatric): Secondary | ICD-10-CM | POA: Diagnosis not present

## 2022-08-24 DIAGNOSIS — I428 Other cardiomyopathies: Secondary | ICD-10-CM | POA: Diagnosis present

## 2022-08-24 DIAGNOSIS — I491 Atrial premature depolarization: Secondary | ICD-10-CM | POA: Diagnosis not present

## 2022-08-24 DIAGNOSIS — N2581 Secondary hyperparathyroidism of renal origin: Secondary | ICD-10-CM | POA: Diagnosis not present

## 2022-08-24 DIAGNOSIS — Z4682 Encounter for fitting and adjustment of non-vascular catheter: Secondary | ICD-10-CM | POA: Diagnosis not present

## 2022-08-24 DIAGNOSIS — R599 Enlarged lymph nodes, unspecified: Secondary | ICD-10-CM | POA: Diagnosis not present

## 2022-08-24 DIAGNOSIS — Z95810 Presence of automatic (implantable) cardiac defibrillator: Secondary | ICD-10-CM

## 2022-08-24 DIAGNOSIS — I4891 Unspecified atrial fibrillation: Secondary | ICD-10-CM | POA: Diagnosis not present

## 2022-08-24 DIAGNOSIS — Z888 Allergy status to other drugs, medicaments and biological substances status: Secondary | ICD-10-CM

## 2022-08-24 DIAGNOSIS — N186 End stage renal disease: Secondary | ICD-10-CM | POA: Diagnosis present

## 2022-08-24 DIAGNOSIS — J690 Pneumonitis due to inhalation of food and vomit: Secondary | ICD-10-CM | POA: Diagnosis not present

## 2022-08-24 DIAGNOSIS — Z887 Allergy status to serum and vaccine status: Secondary | ICD-10-CM

## 2022-08-24 DIAGNOSIS — I480 Paroxysmal atrial fibrillation: Secondary | ICD-10-CM

## 2022-08-24 DIAGNOSIS — R918 Other nonspecific abnormal finding of lung field: Secondary | ICD-10-CM | POA: Diagnosis not present

## 2022-08-24 DIAGNOSIS — Z66 Do not resuscitate: Secondary | ICD-10-CM | POA: Diagnosis not present

## 2022-08-24 DIAGNOSIS — R112 Nausea with vomiting, unspecified: Secondary | ICD-10-CM | POA: Diagnosis not present

## 2022-08-24 DIAGNOSIS — F4310 Post-traumatic stress disorder, unspecified: Secondary | ICD-10-CM | POA: Diagnosis present

## 2022-08-24 DIAGNOSIS — Z992 Dependence on renal dialysis: Secondary | ICD-10-CM

## 2022-08-24 DIAGNOSIS — K5903 Drug induced constipation: Secondary | ICD-10-CM | POA: Diagnosis not present

## 2022-08-24 DIAGNOSIS — R000 Tachycardia, unspecified: Secondary | ICD-10-CM | POA: Diagnosis not present

## 2022-08-24 DIAGNOSIS — Z7982 Long term (current) use of aspirin: Secondary | ICD-10-CM

## 2022-08-24 DIAGNOSIS — K567 Ileus, unspecified: Secondary | ICD-10-CM | POA: Diagnosis not present

## 2022-08-24 DIAGNOSIS — J432 Centrilobular emphysema: Secondary | ICD-10-CM | POA: Diagnosis not present

## 2022-08-24 DIAGNOSIS — R0689 Other abnormalities of breathing: Secondary | ICD-10-CM | POA: Diagnosis not present

## 2022-08-24 DIAGNOSIS — Z8249 Family history of ischemic heart disease and other diseases of the circulatory system: Secondary | ICD-10-CM

## 2022-08-24 DIAGNOSIS — Z880 Allergy status to penicillin: Secondary | ICD-10-CM

## 2022-08-24 DIAGNOSIS — M898X9 Other specified disorders of bone, unspecified site: Secondary | ICD-10-CM | POA: Diagnosis present

## 2022-08-24 DIAGNOSIS — Z7951 Long term (current) use of inhaled steroids: Secondary | ICD-10-CM

## 2022-08-24 DIAGNOSIS — E875 Hyperkalemia: Secondary | ICD-10-CM | POA: Diagnosis present

## 2022-08-24 DIAGNOSIS — R531 Weakness: Secondary | ICD-10-CM | POA: Diagnosis not present

## 2022-08-24 DIAGNOSIS — I5020 Unspecified systolic (congestive) heart failure: Secondary | ICD-10-CM | POA: Diagnosis not present

## 2022-08-24 DIAGNOSIS — Z79899 Other long term (current) drug therapy: Secondary | ICD-10-CM | POA: Diagnosis not present

## 2022-08-24 DIAGNOSIS — E876 Hypokalemia: Secondary | ICD-10-CM | POA: Diagnosis not present

## 2022-08-24 DIAGNOSIS — Z515 Encounter for palliative care: Secondary | ICD-10-CM | POA: Diagnosis not present

## 2022-08-24 DIAGNOSIS — D631 Anemia in chronic kidney disease: Secondary | ICD-10-CM | POA: Diagnosis not present

## 2022-08-24 DIAGNOSIS — J9601 Acute respiratory failure with hypoxia: Secondary | ICD-10-CM

## 2022-08-24 DIAGNOSIS — I340 Nonrheumatic mitral (valve) insufficiency: Secondary | ICD-10-CM | POA: Diagnosis not present

## 2022-08-24 DIAGNOSIS — Z452 Encounter for adjustment and management of vascular access device: Secondary | ICD-10-CM | POA: Diagnosis not present

## 2022-08-24 DIAGNOSIS — I4729 Other ventricular tachycardia: Principal | ICD-10-CM

## 2022-08-24 DIAGNOSIS — Z884 Allergy status to anesthetic agent status: Secondary | ICD-10-CM

## 2022-08-24 DIAGNOSIS — I5023 Acute on chronic systolic (congestive) heart failure: Secondary | ICD-10-CM | POA: Diagnosis not present

## 2022-08-24 DIAGNOSIS — I10 Essential (primary) hypertension: Secondary | ICD-10-CM | POA: Diagnosis not present

## 2022-08-24 DIAGNOSIS — N250 Renal osteodystrophy: Secondary | ICD-10-CM | POA: Diagnosis not present

## 2022-08-24 DIAGNOSIS — J9690 Respiratory failure, unspecified, unspecified whether with hypoxia or hypercapnia: Secondary | ICD-10-CM | POA: Diagnosis not present

## 2022-08-24 DIAGNOSIS — R590 Localized enlarged lymph nodes: Secondary | ICD-10-CM | POA: Diagnosis not present

## 2022-08-24 DIAGNOSIS — J811 Chronic pulmonary edema: Secondary | ICD-10-CM | POA: Diagnosis not present

## 2022-08-24 DIAGNOSIS — K3189 Other diseases of stomach and duodenum: Secondary | ICD-10-CM | POA: Diagnosis not present

## 2022-08-24 DIAGNOSIS — J449 Chronic obstructive pulmonary disease, unspecified: Secondary | ICD-10-CM | POA: Diagnosis not present

## 2022-08-24 DIAGNOSIS — D509 Iron deficiency anemia, unspecified: Secondary | ICD-10-CM | POA: Diagnosis not present

## 2022-08-24 DIAGNOSIS — I129 Hypertensive chronic kidney disease with stage 1 through stage 4 chronic kidney disease, or unspecified chronic kidney disease: Secondary | ICD-10-CM | POA: Diagnosis not present

## 2022-08-24 DIAGNOSIS — I700 Atherosclerosis of aorta: Secondary | ICD-10-CM | POA: Diagnosis not present

## 2022-08-24 HISTORY — DX: End stage renal disease: Z99.2

## 2022-08-24 HISTORY — DX: Dependence on renal dialysis: N18.6

## 2022-08-24 LAB — T4, FREE: Free T4: 1.22 ng/dL — ABNORMAL HIGH (ref 0.61–1.12)

## 2022-08-24 LAB — I-STAT ARTERIAL BLOOD GAS, ED
Acid-Base Excess: 7 mmol/L — ABNORMAL HIGH (ref 0.0–2.0)
Bicarbonate: 31 mmol/L — ABNORMAL HIGH (ref 20.0–28.0)
Calcium, Ion: 1 mmol/L — ABNORMAL LOW (ref 1.15–1.40)
HCT: 31 % — ABNORMAL LOW (ref 39.0–52.0)
Hemoglobin: 10.5 g/dL — ABNORMAL LOW (ref 13.0–17.0)
O2 Saturation: 100 %
Potassium: 3.9 mmol/L (ref 3.5–5.1)
Sodium: 130 mmol/L — ABNORMAL LOW (ref 135–145)
TCO2: 32 mmol/L (ref 22–32)
pCO2 arterial: 41.9 mmHg (ref 32–48)
pH, Arterial: 7.477 — ABNORMAL HIGH (ref 7.35–7.45)
pO2, Arterial: 216 mmHg — ABNORMAL HIGH (ref 83–108)

## 2022-08-24 LAB — I-STAT CHEM 8, ED
BUN: 34 mg/dL — ABNORMAL HIGH (ref 6–20)
Calcium, Ion: 0.91 mmol/L — ABNORMAL LOW (ref 1.15–1.40)
Chloride: 96 mmol/L — ABNORMAL LOW (ref 98–111)
Creatinine, Ser: 9.3 mg/dL — ABNORMAL HIGH (ref 0.61–1.24)
Glucose, Bld: 98 mg/dL (ref 70–99)
HCT: 37 % — ABNORMAL LOW (ref 39.0–52.0)
Hemoglobin: 12.6 g/dL — ABNORMAL LOW (ref 13.0–17.0)
Potassium: 3.4 mmol/L — ABNORMAL LOW (ref 3.5–5.1)
Sodium: 138 mmol/L (ref 135–145)
TCO2: 30 mmol/L (ref 22–32)

## 2022-08-24 LAB — CBC WITH DIFFERENTIAL/PLATELET
Abs Immature Granulocytes: 0.02 10*3/uL (ref 0.00–0.07)
Basophils Absolute: 0 10*3/uL (ref 0.0–0.1)
Basophils Relative: 1 %
Eosinophils Absolute: 0.1 10*3/uL (ref 0.0–0.5)
Eosinophils Relative: 2 %
HCT: 35.8 % — ABNORMAL LOW (ref 39.0–52.0)
Hemoglobin: 12.2 g/dL — ABNORMAL LOW (ref 13.0–17.0)
Immature Granulocytes: 0 %
Lymphocytes Relative: 13 %
Lymphs Abs: 0.7 10*3/uL (ref 0.7–4.0)
MCH: 33.6 pg (ref 26.0–34.0)
MCHC: 34.1 g/dL (ref 30.0–36.0)
MCV: 98.6 fL (ref 80.0–100.0)
Monocytes Absolute: 0.7 10*3/uL (ref 0.1–1.0)
Monocytes Relative: 14 %
Neutro Abs: 3.8 10*3/uL (ref 1.7–7.7)
Neutrophils Relative %: 70 %
Platelets: 251 10*3/uL (ref 150–400)
RBC: 3.63 MIL/uL — ABNORMAL LOW (ref 4.22–5.81)
RDW: 15.8 % — ABNORMAL HIGH (ref 11.5–15.5)
WBC: 5.4 10*3/uL (ref 4.0–10.5)
nRBC: 0 % (ref 0.0–0.2)

## 2022-08-24 LAB — COMPREHENSIVE METABOLIC PANEL
ALT: 18 U/L (ref 0–44)
AST: 15 U/L (ref 15–41)
Albumin: 3.5 g/dL (ref 3.5–5.0)
Alkaline Phosphatase: 62 U/L (ref 38–126)
Anion gap: 16 — ABNORMAL HIGH (ref 5–15)
BUN: 31 mg/dL — ABNORMAL HIGH (ref 6–20)
CO2: 29 mmol/L (ref 22–32)
Calcium: 8.7 mg/dL — ABNORMAL LOW (ref 8.9–10.3)
Chloride: 94 mmol/L — ABNORMAL LOW (ref 98–111)
Creatinine, Ser: 8.59 mg/dL — ABNORMAL HIGH (ref 0.61–1.24)
GFR, Estimated: 7 mL/min — ABNORMAL LOW (ref >60)
Glucose, Bld: 102 mg/dL — ABNORMAL HIGH (ref 70–99)
Potassium: 3.5 mmol/L (ref 3.5–5.1)
Sodium: 139 mmol/L (ref 135–145)
Total Bilirubin: 0.5 mg/dL (ref 0.3–1.2)
Total Protein: 7.6 g/dL (ref 6.5–8.1)

## 2022-08-24 LAB — GLUCOSE, CAPILLARY
Glucose-Capillary: 154 mg/dL — ABNORMAL HIGH (ref 70–99)
Glucose-Capillary: 171 mg/dL — ABNORMAL HIGH (ref 70–99)
Glucose-Capillary: 211 mg/dL — ABNORMAL HIGH (ref 70–99)

## 2022-08-24 LAB — TROPONIN I (HIGH SENSITIVITY): Troponin I (High Sensitivity): 40 ng/L — ABNORMAL HIGH (ref <18)

## 2022-08-24 LAB — CBG MONITORING, ED: Glucose-Capillary: 104 mg/dL — ABNORMAL HIGH (ref 70–99)

## 2022-08-24 LAB — TSH: TSH: 5.964 u[IU]/mL — ABNORMAL HIGH (ref 0.350–4.500)

## 2022-08-24 LAB — MAGNESIUM: Magnesium: 2 mg/dL (ref 1.7–2.4)

## 2022-08-24 LAB — MRSA NEXT GEN BY PCR, NASAL: MRSA by PCR Next Gen: NOT DETECTED

## 2022-08-24 MED ORDER — METOPROLOL TARTRATE 5 MG/5ML IV SOLN
5.0000 mg | Freq: Once | INTRAVENOUS | Status: AC
  Administered 2022-08-24: 5 mg via INTRAVENOUS

## 2022-08-24 MED ORDER — MIDAZOLAM HCL 2 MG/2ML IJ SOLN
2.0000 mg | INTRAMUSCULAR | Status: DC | PRN
  Filled 2022-08-24: qty 2

## 2022-08-24 MED ORDER — SODIUM CHLORIDE 0.9 % IV SOLN: INTRAVENOUS | Status: DC | PRN

## 2022-08-24 MED ORDER — AMIODARONE HCL IN DEXTROSE 360-4.14 MG/200ML-% IV SOLN: 60.0000 mg/h | INTRAVENOUS | Status: DC

## 2022-08-24 MED ORDER — SODIUM CHLORIDE 0.9 % IV SOLN
0.0000 ug/kg/min | INTRAVENOUS | Status: DC
  Administered 2022-08-24: 2.5 ug/kg/min via INTRAVENOUS
  Administered 2022-08-25: 6 ug/kg/min via INTRAVENOUS
  Filled 2022-08-24 (×3): qty 20

## 2022-08-24 MED ORDER — FENTANYL BOLUS VIA INFUSION
50.0000 ug | INTRAVENOUS | Status: DC | PRN
  Administered 2022-08-24: 150 ug via INTRAVENOUS
  Administered 2022-08-25 – 2022-08-27 (×9): 100 ug via INTRAVENOUS

## 2022-08-24 MED ORDER — SODIUM CHLORIDE 0.9% FLUSH: 3.0000 mL | INTRAVENOUS | Status: DC | PRN

## 2022-08-24 MED ORDER — VASOPRESSIN 20 UNITS/100 ML INFUSION FOR SHOCK
0.0000 [IU]/min | INTRAVENOUS | Status: DC
  Administered 2022-08-24 – 2022-08-26 (×4): 0.03 [IU]/min via INTRAVENOUS
  Filled 2022-08-24 (×4): qty 100

## 2022-08-24 MED ORDER — ROCURONIUM BROMIDE 50 MG/5ML IV SOLN
80.0000 mg | Freq: Once | INTRAVENOUS | Status: AC
  Administered 2022-08-24: 80 mg via INTRAVENOUS

## 2022-08-24 MED ORDER — MIDAZOLAM BOLUS VIA INFUSION
0.0000 mg | INTRAVENOUS | Status: DC | PRN
  Administered 2022-08-24: 3 mg via INTRAVENOUS
  Administered 2022-08-24 – 2022-08-26 (×7): 2 mg via INTRAVENOUS
  Administered 2022-08-26: 3 mg via INTRAVENOUS

## 2022-08-24 MED ORDER — AMIODARONE IV BOLUS ONLY 150 MG/100ML
150.0000 mg | Freq: Once | INTRAVENOUS | Status: AC
  Administered 2022-08-24: 150 mg via INTRAVENOUS

## 2022-08-24 MED ORDER — SODIUM CHLORIDE 0.9% FLUSH
10.0000 mL | INTRAVENOUS | Status: DC | PRN
  Administered 2022-09-05: 10 mL

## 2022-08-24 MED ORDER — FLUTICASONE PROPIONATE 50 MCG/ACT NA SUSP: 1.0000 | NASAL | Status: DC | PRN

## 2022-08-24 MED ORDER — AMIODARONE HCL IN DEXTROSE 360-4.14 MG/200ML-% IV SOLN
60.0000 mg/h | INTRAVENOUS | Status: AC
  Administered 2022-08-24 – 2022-08-27 (×10): 60 mg/h via INTRAVENOUS
  Filled 2022-08-24 (×11): qty 200

## 2022-08-24 MED ORDER — VITAMIN D 25 MCG (1000 UNIT) PO TABS
5000.0000 [IU] | ORAL_TABLET | Freq: Every day | ORAL | Status: DC
  Administered 2022-08-26 – 2022-08-27 (×2): 5000 [IU]
  Filled 2022-08-24 (×3): qty 5

## 2022-08-24 MED ORDER — MIDAZOLAM HCL 2 MG/2ML IJ SOLN
INTRAMUSCULAR | Status: AC
  Administered 2022-08-24: 2 mg via INTRAVENOUS
  Filled 2022-08-24: qty 2

## 2022-08-24 MED ORDER — ONDANSETRON HCL 4 MG/2ML IJ SOLN
4.0000 mg | Freq: Once | INTRAMUSCULAR | Status: AC
  Administered 2022-08-24: 4 mg via INTRAVENOUS
  Filled 2022-08-24: qty 2

## 2022-08-24 MED ORDER — HYDROMORPHONE HCL 1 MG/ML IJ SOLN
0.5000 mg | Freq: Once | INTRAMUSCULAR | Status: AC
  Administered 2022-08-24: 0.5 mg via INTRAVENOUS
  Filled 2022-08-24: qty 1

## 2022-08-24 MED ORDER — SODIUM CHLORIDE 0.9 % IV SOLN
250.0000 mL | INTRAVENOUS | Status: DC
  Administered 2022-08-24 – 2022-08-26 (×2): 250 mL via INTRAVENOUS

## 2022-08-24 MED ORDER — MIDAZOLAM HCL 2 MG/2ML IJ SOLN: 2.0000 mg | INTRAMUSCULAR | Status: DC | PRN

## 2022-08-24 MED ORDER — AMIODARONE LOAD VIA INFUSION
150.0000 mg | Freq: Once | INTRAVENOUS | Status: AC
  Administered 2022-08-24: 150 mg via INTRAVENOUS
  Filled 2022-08-24: qty 83.34

## 2022-08-24 MED ORDER — FENTANYL CITRATE PF 50 MCG/ML IJ SOSY: 50.0000 ug | PREFILLED_SYRINGE | Freq: Once | INTRAMUSCULAR | Status: DC

## 2022-08-24 MED ORDER — ATORVASTATIN CALCIUM 80 MG PO TABS
80.0000 mg | ORAL_TABLET | Freq: Every day | ORAL | Status: DC
  Administered 2022-08-24: 80 mg via ORAL
  Filled 2022-08-24: qty 1

## 2022-08-24 MED ORDER — ASPIRIN 81 MG PO TBEC
81.0000 mg | DELAYED_RELEASE_TABLET | Freq: Every day | ORAL | Status: DC
  Administered 2022-08-24: 81 mg via ORAL
  Filled 2022-08-24: qty 1

## 2022-08-24 MED ORDER — FENTANYL 2500MCG IN NS 250ML (10MCG/ML) PREMIX INFUSION
50.0000 ug/h | INTRAVENOUS | Status: DC
  Administered 2022-08-24: 100 ug/h via INTRAVENOUS
  Administered 2022-08-25 – 2022-08-26 (×5): 400 ug/h via INTRAVENOUS
  Administered 2022-08-26: 100 ug/h via INTRAVENOUS
  Filled 2022-08-24 (×7): qty 250

## 2022-08-24 MED ORDER — FENTANYL CITRATE PF 50 MCG/ML IJ SOSY
PREFILLED_SYRINGE | INTRAMUSCULAR | Status: AC
  Administered 2022-08-24: 200 ug via INTRAVENOUS
  Filled 2022-08-24: qty 4

## 2022-08-24 MED ORDER — METOPROLOL TARTRATE 5 MG/5ML IV SOLN
5.0000 mg | Freq: Once | INTRAVENOUS | Status: AC
  Administered 2022-08-24: 5 mg via INTRAVENOUS
  Filled 2022-08-24: qty 5

## 2022-08-24 MED ORDER — CARVEDILOL 6.25 MG PO TABS: 6.2500 mg | ORAL_TABLET | Freq: Two times a day (BID) | ORAL | Status: DC

## 2022-08-24 MED ORDER — ORAL CARE MOUTH RINSE
15.0000 mL | OROMUCOSAL | Status: DC
  Administered 2022-08-24 – 2022-08-27 (×31): 15 mL via OROMUCOSAL

## 2022-08-24 MED ORDER — POTASSIUM CHLORIDE CRYS ER 20 MEQ PO TBCR
40.0000 meq | EXTENDED_RELEASE_TABLET | Freq: Once | ORAL | Status: AC
  Administered 2022-08-24: 40 meq via ORAL
  Filled 2022-08-24: qty 2

## 2022-08-24 MED ORDER — SODIUM CHLORIDE 0.9 % IV SOLN: 250.0000 mL | INTRAVENOUS | Status: DC | PRN

## 2022-08-24 MED ORDER — DOCUSATE SODIUM 100 MG PO CAPS: 100.0000 mg | ORAL_CAPSULE | Freq: Two times a day (BID) | ORAL | Status: DC

## 2022-08-24 MED ORDER — CALCIUM ACETATE (PHOS BINDER) 667 MG PO CAPS: 1334.0000 mg | ORAL_CAPSULE | Freq: Three times a day (TID) | ORAL | Status: DC

## 2022-08-24 MED ORDER — MAGNESIUM SULFATE 2 GM/50ML IV SOLN
2.0000 g | Freq: Once | INTRAVENOUS | Status: AC
  Administered 2022-08-24: 2 g via INTRAVENOUS
  Filled 2022-08-24: qty 50

## 2022-08-24 MED ORDER — FENTANYL CITRATE PF 50 MCG/ML IJ SOSY
PREFILLED_SYRINGE | INTRAMUSCULAR | Status: AC
  Filled 2022-08-24: qty 1

## 2022-08-24 MED ORDER — ONDANSETRON HCL 4 MG/2ML IJ SOLN
4.0000 mg | Freq: Four times a day (QID) | INTRAMUSCULAR | Status: DC | PRN
  Administered 2022-08-27: 4 mg via INTRAVENOUS
  Filled 2022-08-24: qty 2

## 2022-08-24 MED ORDER — POLYETHYLENE GLYCOL 3350 17 G PO PACK: 17.0000 g | PACK | Freq: Every day | ORAL | Status: DC

## 2022-08-24 MED ORDER — HEPARIN SODIUM (PORCINE) 5000 UNIT/ML IJ SOLN
5000.0000 [IU] | Freq: Three times a day (TID) | INTRAMUSCULAR | Status: DC
  Administered 2022-08-24: 5000 [IU] via SUBCUTANEOUS
  Filled 2022-08-24: qty 1

## 2022-08-24 MED ORDER — ASPIRIN 81 MG PO CHEW
81.0000 mg | CHEWABLE_TABLET | ORAL | Status: AC
  Administered 2022-08-25: 81 mg via ORAL
  Filled 2022-08-24: qty 1

## 2022-08-24 MED ORDER — NOREPINEPHRINE 4 MG/250ML-% IV SOLN
2.0000 ug/min | INTRAVENOUS | Status: DC
  Administered 2022-08-24: 10 ug/min via INTRAVENOUS

## 2022-08-24 MED ORDER — ARTIFICIAL TEARS OPHTHALMIC OINT
1.0000 | TOPICAL_OINTMENT | Freq: Three times a day (TID) | OPHTHALMIC | Status: DC
  Administered 2022-08-24 – 2022-08-26 (×7): 1 via OPHTHALMIC
  Filled 2022-08-24 (×3): qty 3.5

## 2022-08-24 MED ORDER — NOREPINEPHRINE 4 MG/250ML-% IV SOLN
INTRAVENOUS | Status: AC
  Administered 2022-08-24: 5 ug/min via INTRAVENOUS
  Filled 2022-08-24: qty 250

## 2022-08-24 MED ORDER — SUCROFERRIC OXYHYDROXIDE 500 MG PO CHEW: 1000.0000 mg | CHEWABLE_TABLET | Freq: Three times a day (TID) | ORAL | Status: DC

## 2022-08-24 MED ORDER — NOREPINEPHRINE 4 MG/250ML-% IV SOLN
0.0000 ug/min | INTRAVENOUS | Status: DC
  Administered 2022-08-24: 8 ug/min via INTRAVENOUS
  Administered 2022-08-25 (×2): 6 ug/min via INTRAVENOUS
  Administered 2022-08-27: 4 ug/min via INTRAVENOUS
  Filled 2022-08-24 (×4): qty 250

## 2022-08-24 MED ORDER — ALBUTEROL SULFATE (2.5 MG/3ML) 0.083% IN NEBU: 3.0000 mL | INHALATION_SOLUTION | Freq: Four times a day (QID) | RESPIRATORY_TRACT | Status: DC | PRN

## 2022-08-24 MED ORDER — SODIUM CHLORIDE 0.9% FLUSH
3.0000 mL | Freq: Two times a day (BID) | INTRAVENOUS | Status: DC
  Administered 2022-08-26: 3 mL via INTRAVENOUS

## 2022-08-24 MED ORDER — CHLORHEXIDINE GLUCONATE CLOTH 2 % EX PADS
6.0000 | MEDICATED_PAD | Freq: Every day | CUTANEOUS | Status: DC
  Administered 2022-08-26 – 2022-08-27 (×4): 6 via TOPICAL

## 2022-08-24 MED ORDER — ACETAMINOPHEN 325 MG PO TABS
650.0000 mg | ORAL_TABLET | ORAL | Status: DC | PRN
  Administered 2022-08-29 – 2022-09-06 (×4): 650 mg via ORAL
  Filled 2022-08-24 (×5): qty 2

## 2022-08-24 MED ORDER — SODIUM CHLORIDE 0.9 % IV BOLUS
250.0000 mL | Freq: Once | INTRAVENOUS | Status: AC
  Administered 2022-08-24: 250 mL via INTRAVENOUS

## 2022-08-24 MED ORDER — POTASSIUM CHLORIDE 10 MEQ/100ML IV SOLN: 10.0000 meq | INTRAVENOUS | Status: AC

## 2022-08-24 MED ORDER — QUINIDINE GLUCONATE ER 324 MG PO TBCR: 324.0000 mg | EXTENDED_RELEASE_TABLET | Freq: Three times a day (TID) | ORAL | Status: DC

## 2022-08-24 MED ORDER — PROCAINAMIDE HCL 100 MG/ML IJ SOLN
1000.0000 mg | Freq: Once | INTRAVENOUS | Status: AC
  Administered 2022-08-24: 1000 mg via INTRAVENOUS
  Filled 2022-08-24: qty 10

## 2022-08-24 MED ORDER — MIDAZOLAM HCL 2 MG/2ML IJ SOLN: 2.0000 mg | Freq: Once | INTRAMUSCULAR | Status: AC

## 2022-08-24 MED ORDER — FENTANYL CITRATE PF 50 MCG/ML IJ SOSY: 200.0000 ug | PREFILLED_SYRINGE | Freq: Once | INTRAMUSCULAR | Status: AC

## 2022-08-24 MED ORDER — EPINEPHRINE 0.3 MG/0.3ML IJ SOAJ: 0.3000 mg | Freq: Once | INTRAMUSCULAR | Status: DC | PRN

## 2022-08-24 MED ORDER — PANTOPRAZOLE 2 MG/ML SUSPENSION
40.0000 mg | Freq: Every day | ORAL | Status: DC
  Administered 2022-08-24 – 2022-08-27 (×3): 40 mg
  Filled 2022-08-24 (×5): qty 20

## 2022-08-24 MED ORDER — POLYETHYLENE GLYCOL 3350 17 G PO PACK
17.0000 g | PACK | Freq: Every day | ORAL | Status: DC
  Administered 2022-08-24 – 2022-08-27 (×3): 17 g
  Filled 2022-08-24 (×3): qty 1

## 2022-08-24 MED ORDER — SODIUM CHLORIDE 0.9 % IV SOLN: INTRAVENOUS | Status: DC

## 2022-08-24 MED ORDER — AMIODARONE HCL IN DEXTROSE 360-4.14 MG/200ML-% IV SOLN
60.0000 mg/h | INTRAVENOUS | Status: AC
  Administered 2022-08-24: 60 mg/h via INTRAVENOUS
  Filled 2022-08-24 (×3): qty 200

## 2022-08-24 MED ORDER — DOCUSATE SODIUM 50 MG/5ML PO LIQD
100.0000 mg | Freq: Two times a day (BID) | ORAL | Status: DC
  Administered 2022-08-24 – 2022-08-27 (×7): 100 mg
  Filled 2022-08-24 (×7): qty 10

## 2022-08-24 MED ORDER — ORAL CARE MOUTH RINSE: 15.0000 mL | OROMUCOSAL | Status: DC | PRN

## 2022-08-24 MED ORDER — SODIUM CHLORIDE 0.9% FLUSH: 3.0000 mL | Freq: Two times a day (BID) | INTRAVENOUS | Status: DC

## 2022-08-24 MED ORDER — MOMETASONE FURO-FORMOTEROL FUM 100-5 MCG/ACT IN AERO
2.0000 | INHALATION_SPRAY | Freq: Two times a day (BID) | RESPIRATORY_TRACT | Status: DC
  Filled 2022-08-24: qty 8.8

## 2022-08-24 MED ORDER — ETOMIDATE 2 MG/ML IV SOLN
20.0000 mg | Freq: Once | INTRAVENOUS | Status: AC
  Administered 2022-08-24: 20 mg via INTRAVENOUS

## 2022-08-24 MED ORDER — MEXILETINE HCL 200 MG PO CAPS
200.0000 mg | ORAL_CAPSULE | Freq: Two times a day (BID) | ORAL | Status: DC
  Administered 2022-08-24: 200 mg
  Filled 2022-08-24: qty 1

## 2022-08-24 MED ORDER — FERRIC CITRATE 1 GM 210 MG(FE) PO TABS: 210.0000 mg | ORAL_TABLET | Freq: Three times a day (TID) | ORAL | Status: DC

## 2022-08-24 MED ORDER — SODIUM CHLORIDE 0.9% FLUSH
10.0000 mL | Freq: Two times a day (BID) | INTRAVENOUS | Status: DC
  Administered 2022-08-24 – 2022-09-10 (×26): 10 mL

## 2022-08-24 MED ORDER — METOPROLOL TARTRATE 5 MG/5ML IV SOLN
INTRAVENOUS | Status: AC
  Filled 2022-08-24: qty 10

## 2022-08-24 MED ORDER — SODIUM CHLORIDE 0.9 % IV SOLN
0.0000 ug/kg/min | INTRAVENOUS | Status: DC
  Administered 2022-08-24: 3 ug/kg/min via INTRAVENOUS
  Filled 2022-08-24: qty 20

## 2022-08-24 MED ORDER — HYDROXYZINE HCL 25 MG PO TABS: 25.0000 mg | ORAL_TABLET | Freq: Every evening | ORAL | Status: DC | PRN

## 2022-08-24 MED ORDER — ENOXAPARIN SODIUM 30 MG/0.3ML IJ SOSY: 30.0000 mg | PREFILLED_SYRINGE | INTRAMUSCULAR | Status: DC

## 2022-08-24 MED ORDER — MIDAZOLAM-SODIUM CHLORIDE 100-0.9 MG/100ML-% IV SOLN
0.0000 mg/h | INTRAVENOUS | Status: DC
  Administered 2022-08-24: 2 mg/h via INTRAVENOUS
  Administered 2022-08-25 – 2022-08-26 (×4): 10 mg/h via INTRAVENOUS
  Administered 2022-08-26: 2 mg/h via INTRAVENOUS
  Filled 2022-08-24 (×5): qty 100

## 2022-08-24 MED ORDER — LORATADINE 10 MG PO TABS: 10.0000 mg | ORAL_TABLET | Freq: Every day | ORAL | Status: DC

## 2022-08-24 MED ORDER — ALBUTEROL SULFATE HFA 108 (90 BASE) MCG/ACT IN AERS: 2.0000 | INHALATION_SPRAY | RESPIRATORY_TRACT | Status: DC | PRN

## 2022-08-24 MED ORDER — NOREPINEPHRINE 16 MG/250ML-% IV SOLN: 0.0000 ug/min | INTRAVENOUS | Status: DC

## 2022-08-24 MED ORDER — QUINIDINE SULFATE 200 MG PO TABS
200.0000 mg | ORAL_TABLET | Freq: Three times a day (TID) | ORAL | Status: DC
  Administered 2022-08-24 – 2022-08-28 (×11): 200 mg
  Filled 2022-08-24 (×12): qty 1

## 2022-08-24 MED ORDER — VASOPRESSIN 20 UNITS/100 ML INFUSION FOR SHOCK
INTRAVENOUS | Status: AC
  Administered 2022-08-24: 0.03 [IU]/min via INTRAVENOUS
  Filled 2022-08-24: qty 100

## 2022-08-24 NOTE — Consult Note (Addendum)
Advanced Heart Failure Team Consult Note   Primary Physician: [name], MD PCP-Cardiologist:  Loralie Champagne, MD  Reason for Consultation: VT Storm   HPI:    Victor Thompson is seen today for evaluation of VT storm at the request of Dr Tyrone Nine.   Mr Soulliere is 61 year old with a history of VT, ESRD, CAD, ischemic CMP, and ESRD.  He had PCI in 2094, uncertain what vessel was involved. Echo 2/22: EF 25-30% with moderate LV dilation and normal RV.  He had VT arrest in 2015, has [employer] subcutaneous ICD.  [name] in 2/22.  LHC/RHC was done in 8/22, showing no significant CAD and stable hemodynamics.    Echo 11/22 EF 25-30%, moderate LV dilation, diffuse hypokinesis, normal RV.   Patient had ICD shock in 6/23 while at HD => VT storm.  He was noted to be volume overloaded.  He was started on amiodarone.    Admitted 08/17/22 with VT storm. Missed at least 2 wks of amiodarone prior.  Had 28 shocks. Started on amio drip and transitioned to amio 400 mg twice a day.   Earlier today he was at HD and was shocked x3. In the ED had multiple ICD shocks + external shocks. Started amio drip and given lopressor. Refractory  VT complicated by hypotension. Started on norepi, vasopressin, and given a bolus of procainamide. CCM consulted for urgent intubation.   Review of Systems: [y] = yes, '[ ]'$  = no   General: Weight gain '[ ]'$ ; Weight loss '[ ]'$ ; Anorexia '[ ]'$ ; Fatigue [ Y]; Fever '[ ]'$ ; Chills '[ ]'$ ; Weakness [Y ]  Cardiac: Chest pain/pressure '[ ]'$ ; Resting SOB '[ ]'$ ; Exertional SOB [ Y]; Orthopnea '[ ]'$ ; Pedal Edema '[ ]'$ ; Palpitations '[ ]'$ ; Syncope '[ ]'$ ; Presyncope '[ ]'$ ; Paroxysmal nocturnal dyspnea'[ ]'$   Pulmonary: Cough '[ ]'$ ; Wheezing'[ ]'$ ; Hemoptysis'[ ]'$ ; Sputum '[ ]'$ ; Snoring '[ ]'$   GI: Vomiting'[ ]'$ ; Dysphagia'[ ]'$ ; Melena'[ ]'$ ; Hematochezia '[ ]'$ ; Heartburn'[ ]'$ ; Abdominal pain '[ ]'$ ; Constipation '[ ]'$ ; Diarrhea '[ ]'$ ; BRBPR '[ ]'$   GU: Hematuria'[ ]'$ ; Dysuria '[ ]'$ ; Nocturia'[ ]'$   Vascular: Pain in legs with walking '[ ]'$ ; Pain in  feet with lying flat '[ ]'$ ; Non-healing sores '[ ]'$ ; Stroke '[ ]'$ ; TIA '[ ]'$ ; Slurred speech '[ ]'$ ;  Neuro: Headaches'[ ]'$ ; Vertigo'[ ]'$ ; Seizures'[ ]'$ ; Paresthesias'[ ]'$ ;Blurred vision '[ ]'$ ; Diplopia '[ ]'$ ; Vision changes '[ ]'$   Ortho/Skin: Arthritis '[ ]'$ ; Joint pain [ Y]; Muscle pain '[ ]'$ ; Joint swelling '[ ]'$ ; Back Pain '[ ]'$ ; Rash '[ ]'$   Psych: Depression'[ ]'$ ; Anxiety'[ ]'$   Heme: Bleeding problems '[ ]'$ ; Clotting disorders '[ ]'$ ; Anemia '[ ]'$   Endocrine: Diabetes '[ ]'$ ; Thyroid dysfunction'[ ]'$   Home Medications Prior to Admission medications   Medication Sig Start Date End Date Taking? Authorizing Provider  albuterol (PROVENTIL) (2.5 MG/3ML) 0.083% nebulizer solution TAKE 3 MLS (2.5 MG TOTAL) BY NEBULIZATION EVERY 6 (SIX) HOURS AS NEEDED FOR WHEEZING OR SHORTNESS OF BREATH. 12/30/21 12/30/22 Yes [name], MD  albuterol (VENTOLIN HFA) 108 (90 Base) MCG/ACT inhaler Inhale 2 puffs into the lungs every 4 (four) hours as needed for wheezing or shortness of breath. 10/01/19  Yes [name], MD  amiodarone (PACERONE) 200 MG tablet Take 2 tabs (400 mg) twice daily for 14 days (now through Sep 30). Then take 1 tab (200 mg) once daily (starting Oct 1) 08/21/22  Yes [name] T, PA-C  aspirin EC  81 MG tablet Take 1 tablet (81 mg total) by mouth daily. 01/22/16  Yes [name], Josalyn, MD  atorvastatin (LIPITOR) 80 MG tablet Take 1 tablet (80 mg total) by mouth at bedtime. 06/25/22 06/25/23 Yes Larey Dresser, MD  AURYXIA 1 GM 210 MG(Fe) tablet Take 1 tablet (210 mg total) by mouth 3 (three) times daily. 08/12/21  Yes [name], MD  calcium acetate (PHOSLO) 667 MG capsule Take 2 capsule by mouth three times a day with meals Patient taking differently: Take 1,334 mg by mouth 3 (three) times daily with meals. 06/09/21  Yes   carvedilol (COREG) 6.25 MG tablet Take 1 tablet (6.25 mg total) by mouth 2 (two) times daily. 08/21/22  Yes Weaver, Scott T, PA-C  cetirizine (ZYRTEC) 10 MG tablet Take 1 tablet (10 mg total) by mouth 2 (two)  times daily. Patient taking differently: Take 10 mg by mouth 2 (two) times daily as needed for allergies. 11/12/21  Yes Valentina Shaggy, MD  Cholecalciferol (VITAMIN D-3) 125 MCG (5000 UT) TABS Take 5,000 Units by mouth daily.   Yes [provider]  docusate sodium (COLACE) 100 MG capsule Take 1 capsule (100 mg total) by mouth 2 (two) times daily. 02/15/22  Yes [name], MD  EPINEPHrine 0.3 mg/0.3 mL IJ SOAJ injection Inject 0.3 mg into the muscle once as needed for up to 2 doses (if worsening tongue swelling, SOB, hypoxia, or other concerns for progressive anaphylaxis). 07/15/21  Yes [name], MD  ethyl chloride spray SPRAY A SMALL AMOUNT THREE TIMES A WEEK JUST PRIOR TO NEEDLE INSERTION Patient taking differently: Apply 1 Application topically 3 (three) times a week. 10/26/21  Yes [name], MD  fluticasone (FLONASE) 50 MCG/ACT nasal spray Place 1 spray into both nostrils as needed for allergies or rhinitis.   Yes [provider]  hydrOXYzine (ATARAX) 25 MG tablet Take 1-2 tablets (25-50 mg total) by mouth at bedtime as needed for anxiety/insomnia. 08/23/22  Yes Mayers, Cari S, PA-C  polyethylene glycol (MIRALAX) 17 g packet Take 17 g by mouth daily. Titrate up as needed 05/05/22  Yes Armbruster, Carlota Raspberry, MD  Rectal Protectant-Emollient (CALMOL-4) 76-10 % SUPP Use as directed once to twice daily Patient taking differently: Place 1 suppository rectally 2 (two) times daily as needed (constipation). Use as directed once to twice daily 06/18/22  Yes [name], MD  SYMBICORT 80-4.5 MCG/ACT inhaler INHALE TWO PUFFS INTO THE LUNGS TWICE DAILY (BULK) Patient taking differently: Inhale 2 puffs into the lungs in the morning and at bedtime. 03/10/22  Yes [name], MD  VELPHORO 500 MG chewable tablet Chew 1,000 mg by mouth 3 (three) times daily. 08/06/22  Yes [provider]    Past Medical History: Past Medical History:  Diagnosis Date    Acute on chronic systolic CHF (congestive heart failure) ([facility]) 06/14/2020   Acute respiratory failure ([facility]) 05/28/2022   AICD (automatic cardioverter/defibrillator) present    2015   Allergy    Asthma    no meds   Cardiac arrest ([facility]) 2015   Chest pain 05/25/2022   CHF (congestive heart failure) ([facility])    Coronary artery disease    ESRD on hemodialysis ([facility])    ckd -stage 5   Hyperlipidemia    Hypertension    Myocardial infarction ([facility])    Obesity    Pneumonia    Shortness of breath dyspnea    Sleep apnea    USES CPAP  Wears glasses     Past Surgical History: Past Surgical History:  Procedure Laterality Date   AV FISTULA PLACEMENT Right 03/15/2019   Procedure: RIGHT ARM ARTERIOVENOUS (AV) FISTULA CREATION;  Surgeon: Angelia Mould, MD;  Location: [facility];  Service: Vascular;  Laterality: Right;   COLONOSCOPY     COLONOSCOPY W/ BIOPSIES AND POLYPECTOMY     CORONARY STENT PLACEMENT  2007   IMPLANTABLE CARDIOVERTER DEFIBRILLATOR IMPLANT  2015   RIGHT/LEFT HEART CATH AND CORONARY ANGIOGRAPHY N/A 07/08/2021   Procedure: RIGHT/LEFT HEART CATH AND CORONARY ANGIOGRAPHY;  Surgeon: Larey Dresser, MD;  Location: [facility];  Service: Cardiovascular;  Laterality: N/A;   SUBQ ICD CHANGEOUT N/A 10/13/2020   Procedure: SUBQ ICD CHANGEOUT;  Surgeon: Evans Lance, MD;  Location: [facility];  Service: Cardiovascular;  Laterality: N/A;    Family History: Family History  Problem Relation Age of Onset   Hypertension Mother    Liver disease Father    Colon cancer Neg Hx    Esophageal cancer Neg Hx    Rectal cancer Neg Hx    Stomach cancer Neg Hx     Social History: Social History   Socioeconomic History   Marital status: Single    Spouse name: Not on file   Number of children: 3   Years of education: Not on file   Highest education level: Not on file  Occupational History   Occupation: retired  Tobacco Use   Smoking status: Never   Smokeless tobacco:  Never  Vaping Use   Vaping Use: Never used  Substance and Sexual Activity   Alcohol use: No   Drug use: No   Sexual activity: Yes  Other Topics Concern   Not on file  Social History Narrative   ** Merged History Encounter **       Social Determinants of Health   Financial Resource Strain: Not on file  Food Insecurity: No Food Insecurity (08/23/2022)   Hunger Vital Sign    Worried About Running Out of Food in the Last Year: Never true    Ran Out of Food in the Last Year: Never true  Transportation Needs: No Transportation Needs (08/23/2022)   PRAPARE - Hydrologist (Medical): No    Lack of Transportation (Non-Medical): No  Physical Activity: Insufficiently Active (08/20/2022)   Exercise Vital Sign    Days of Exercise per Week: 1 day    Minutes of Exercise per Session: 10 min  Stress: Not on file  Social Connections: Not on file    Allergies:  Allergies  Allergen Reactions   Ace Inhibitors Swelling    Swelling of the tongue   Influenza Vaccines Hives and Swelling    SWELLING REACTION UNSPECIFIED    Ketorolac Swelling    SWELLING REACTION UNSPECIFIED    Lidocaine Swelling    TONGUE SWELLS   Lisinopril Swelling    TONGUE SWELLING Pt reported problem with a BP med which sounded like  lisinopril  But as of 09/19/06,pt had tolerated altace without problem   Penicillins Swelling    TONGUE SWELLS Has patient had a PCN reaction causing immediate rash, facial/tongue/throat swelling, SOB or lightheadedness with hypotension: Yes Has patient had a PCN reaction causing severe rash involving mucus membranes or skin necrosis: No Has patient had a PCN reaction that required hospitalization No Has patient had a PCN reaction occurring within the last 10 years: Yes If all of the above answers are "NO",  then may proceed with Cephalosporin use.     Objective:    Vital Signs:   Temp:  [98 F (36.7 C)] 98 F (36.7 C) (09/19 1033) Pulse Rate:  [30-160]  105 (09/19 1441) Resp:  [10-26] 18 (09/19 1441) BP: (67-146)/(36-133) 110/63 (09/19 1436) SpO2:  [95 %-100 %] 100 % (09/19 1441) Arterial Line BP: (69-120)/(42-63) 85/55 (09/19 1441) Weight:  [161 kg] 119 kg (09/19 1031)    Weight change: Filed Weights   08/24/22 1031  Weight: 119 kg    Intake/Output:   Intake/Output Summary (Last 24 hours) at 08/24/2022 1446 Last data filed at 08/24/2022 1429 Gross per 24 hour  Intake 500 ml  Output --  Net 500 ml      Physical Exam    General:  Intubated.  HEENT: normal Neck: supple. JVP difficult to assess. Carotids 2+ bilat; no bruits. No lymphadenopathy or thyromegaly appreciated. Cor: PMI nondisplaced. Regular rate & rhythm. No rubs, gallops or murmurs. Lungs: clear Abdomen: soft, nontender, nondistended. No hepatosplenomegaly. No bruits or masses. Good bowel sounds. Extremities: no cyanosis, clubbing, rash, edema. R AVF Neuro: Sedated intubated.   Telemetry   VT   EKG    VT  Labs   Basic Metabolic Panel: Recent Labs  Lab 08/18/22 0554 08/19/22 0243 08/20/22 0732 08/21/22 0800 08/24/22 1040 08/24/22 1107 08/24/22 1435  NA 137 139 137 137  137 139 138 130*  K 4.7 4.6 4.4 4.4  4.4 3.5 3.4* 3.9  CL 93* 98 93* 95*  95* 94* 96*  --   CO2 '24 25 26 24  24 29  '$ --   --   GLUCOSE 103* 108* 104* 150*  151* 102* 98  --   BUN 62* 82* 55* 75*  74* 31* 34*  --   CREATININE 13.34* 15.84* 12.16* 14.77*  14.77* 8.59* 9.30*  --   CALCIUM 9.0 8.7* 9.3 8.9  8.9 8.7*  --   --   MG 2.3 2.4 2.1 2.1 2.0  --   --   PHOS  --   --   --  7.0*  --   --   --     Liver Function Tests: Recent Labs  Lab 08/21/22 0800 08/24/22 1040  AST  --  15  ALT  --  18  ALKPHOS  --  62  BILITOT  --  0.5  PROT  --  7.6  ALBUMIN 3.1* 3.5   No results for input(s): "LIPASE", "AMYLASE" in the last 168 hours. No results for input(s): "AMMONIA" in the last 168 hours.  CBC: Recent Labs  Lab 08/19/22 0730 08/21/22 0800 08/24/22 1040  08/24/22 1107 08/24/22 1435  WBC 6.3 6.3 5.4  --   --   NEUTROABS  --   --  3.8  --   --   HGB 10.5* 10.5* 12.2* 12.6* 10.5*  HCT 30.5* 31.1* 35.8* 37.0* 31.0*  MCV 96.2 98.1 98.6  --   --   PLT 171 160 251  --   --     Cardiac Enzymes: No results for input(s): "CKTOTAL", "CKMB", "CKMBINDEX", "TROPONINI" in the last 168 hours.  BNP: BNP (last 3 results) Recent Labs    05/25/22 0705 08/16/22 0627 08/16/22 1257  BNP 1,355.4* QUANTITY NOT SUFFICIENT, UNABLE TO PERFORM TEST 399.2*    ProBNP (last 3 results) No results for input(s): "PROBNP" in the last 8760 hours.   CBG: Recent Labs  Lab 08/24/22 1040  GLUCAP 104*    Coagulation Studies: No  results for input(s): "LABPROT", "INR" in the last 72 hours.   Imaging   DG Chest Port 1 View  Result Date: 08/24/2022 CLINICAL DATA:  Ventricular tachycardia EXAM: PORTABLE CHEST 1 VIEW COMPARISON:  08/16/2022 FINDINGS: Subcutaneous ICD noted. Distal tip about the vertical level of the upper aortic arch. Mild cardiomegaly. The lungs appear clear. Previous signs of pulmonary venous hypertension are no longer readily apparent. No blunting of the costophrenic angles. Thoracic spondylosis. IMPRESSION: 1. Mild cardiomegaly, without edema. 2. Subcutaneous ICD noted. Electronically Signed   By: Van Clines M.D.   On: 08/24/2022 10:41     Medications:     Current Medications:  docusate  100 mg Per Tube BID   fentaNYL (SUBLIMAZE) injection  50 mcg Intravenous Once   metoprolol tartrate       polyethylene glycol  17 g Per Tube Daily    Infusions:  sodium chloride 250 mL (08/24/22 1403)   sodium chloride     amiodarone 60 mg/hr (08/24/22 1059)   fentaNYL infusion INTRAVENOUS 100 mcg/hr (08/24/22 1433)   midazolam 2 mg/hr (08/24/22 1433)   norepinephrine (LEVOPHED) Adult infusion 14 mcg/min (08/24/22 1440)   potassium chloride     procainamide (PRONESTYL) 1,000 mg in dextrose 5 % 250 mL IVPB 1,000 mg (08/24/22 1409)    vasopressin 0.03 Units/min (08/24/22 1414)      Patient Profile  Mr Brister is 61 year old with a history of VT, ESRD, CAD, ischemic CMP, and ESRD.  He had PCI in 7425, uncertain what vessel was involved. Echo 2/22: EF 25-30% with moderate LV dilation and normal RV.  He had VT arrest in 2015, has St Jude subcutaneous ICD.  [name] in 2/22.  LHC/RHC was done in 8/22, showing no significant CAD and stable hemodynamics.  Admitted with refractory VT complicated by shock.    Assessment/Plan  1. VT Storm -History VT storm 05/2022 and  08/16/22 -Admitted with refractory VT with multiple shocks. Started on amio and procainamide.  - Intubated/sedated.  -Had cath 2022 no significant disease. Plan cath tomorrow  to reassess.  - HS Troponin 40 - Concern for sarcoid?  Unable to obtain CMRI due to artifact   2. Shock in the setting of VT Storm  Placed on vasopressin + norepi.  Placing central line/arterial line.   3. Chronic HFrEF Suspect primarily nonischemic cardiomyopathy.  Echo in 2/22 with EF 25-30%, moderate LV dilation, mild LVH, normal RV size and systolic function, mild MR. Coronary angiography was done in 8/22, no significant CAD.  He has a [employer] subcutaneous ICD. Echo 11/22 EF 25-30%, moderate LV dilation, normal RV. Volume managed per HD.   - Holding HF meds with hypotension.   4. ESRD Nephrology consulted.   5. OSA On CPAP   I personally called his cousin, Hassan Rowan [phone] to discuss tenuous condition. Admitted to [facility] by EP.   Dr Aundra Dubin at bedside.    Length of Stay: 0  Darrick Grinder, NP  08/24/2022, 2:46 PM  Advanced Heart Failure Team Pager [phone] (M-F; 7a - 5p)  Please contact [facility] for night-coverage after hours (4p -7a ) and weekends on [email or web address]  Patient seen with NP, agree with the above note.   Patient was admitted with VT storm, multiple shocks from his defibrillator with recurrent monomorphic VT.  He was at HD today when VT began. No  pre-existing chest pain or dyspnea.  HS-TnI minimally elevated. He reports compliance with amiodarone at home.  He was started  on IV amiodarone and given multiple boluses.  He received boluses of metoprolol IV, magnesium sulfate, and KCl.  He cannot get lidocaine due to previous symptoms consistent with angioedema.    Eventually, he was sedated and intubated.  He continued to have VT and had episodes of external cardioversion after magnet was put on his device.  He eventually appeared to be in slow VT (rate 90s) alternating with NSR.  He required norepinephrine and vasopressin to maintain MAP with sedation.   General: Intubated, sedated.  Neck: No JVD, no thyromegaly or thyroid nodule.  Lungs: Clear to auscultation bilaterally with normal respiratory effort. CV: Nondisplaced PMI.  Heart tachy, regular S1/S2, no S3/S4, no murmur.  Trace ankle edema.  No carotid bruit.  Normal pedal pulses.  Abdomen: Soft, nontender, no hepatosplenomegaly, no distention.  Skin: Intact without lesions or rashes.  Neurologic: Sedated on vent. Extremities: No clubbing or cyanosis.  HEENT: Normal.   1. VT storm: Has has a Animator ICD.  Refractory monomorphic VT. Multiple shocks.  Currently alternating slow VT in 90s with NSR.  BP drops with VT runs.   - Continue amiodarone gtt 30 mg/hr.  - Would not use further procainamide due to risk for worsening hypotension and ESRD state.  - He is allergic to lidocaine with suspected angioedema.  Will start mexiletine 200 mg bid per tube.  This should not cross-react in terms of allergies.   - No further beta blocker with hypotension.  - Favor vasopressin for pressor and wean NE as able to limit catecholeaminergic stimuli.   - Will paralyze with cisatracurium and sedate overnight to limit catecholeaminergic stimuli.  - Long term very concerning, not candidate for LVAD with ESRD.  Discussed with Dr. Quentin Ore and not candidate for VT ablation.  2. Acute on chronic systolic CHF  with cardiogenic shock: In setting of incessant VT.  H/o NICM, last echo in 11/22 with EF 25-30%.  At last appointment, unable to complete cardiac MRI due to artifact from [facility] ICD.  PYP scan was negative.  He does not appear significantly volume overloaded on exam.  Currently on NE 8 with vasopressin 0.03, pressors required in setting of VT and sedation.  - Wean pressors as able, start with NE.   - Repeat echo.  - May need CVVH for volume management if pressor requirement does not resolve.  - Plan for RHC/LHC tomorrow to rule out CAD, think unlikely but with recurrent VT think that we must reassess this.  3. CAD: ?PCI in 2005, unsure what vessel.  Coronary angiography in 8/22 showed no significant CAD and also does not appear to show prior stent so h/o CAD is questionable. No chest pain.  HS-TnI not significantly elevated, doubt ACS.  - As above, LHC/RHC tomorrow.  4. ESRD: Since 2/22.  - Consult nephrology.  - May need CVVH if pressor requirement does not resolve.   Discussed with family.   CRITICAL CARE Performed by: Loralie Champagne  Total critical care time: 120 minutes  Critical care time was exclusive of separately billable procedures and treating other patients.  Critical care was necessary to treat or prevent imminent or life-threatening deterioration.  Critical care was time spent personally by me on the following activities: development of treatment plan with patient and/or surrogate as well as nursing, discussions with consultants, evaluation of patient's response to treatment, examination of patient, obtaining history from patient or surrogate, ordering and performing treatments and interventions, ordering and review of laboratory studies, ordering and review  of radiographic studies, pulse oximetry and re-evaluation of patient's condition.  Loralie Champagne 08/24/2022

## 2022-08-24 NOTE — Procedures (Signed)
Intubation Procedure Note  [name]  [id number]  01/01/1961  Date:08/24/22  Time:3:00 PM   Provider Performing:Susannah Carbin    Procedure: Intubation (31500)  Indication(s) Respiratory Failure  Consent Risks of the procedure as well as the alternatives and risks of each were explained to the patient and/or caregiver.  Consent for the procedure was obtained and is signed in the bedside chart   Anesthesia Etomidate, Versed, Fentanyl, and Rocuronium   Time Out Verified patient identification, verified procedure, site/side was marked, verified correct patient position, special equipment/implants available, medications/allergies/relevant history reviewed, required imaging and test results available.   Sterile Technique Usual hand hygeine, masks, and gloves were used   Procedure Description Patient positioned in bed supine.  Sedation given as noted above.  Patient was intubated with endotracheal tube using Glidescope.  View was Grade 1 full glottis .  Number of attempts was 1.  Colorimetric CO2 detector was consistent with tracheal placement.   Complications/Tolerance None; patient tolerated the procedure well. Chest X-ray is ordered to verify placement.  Kipp Brood, MD [facility] ICU Physician [place]  Pager: [phone] Or Epic Secure Chat After hours: [phone].  08/24/2022, 3:02 PM

## 2022-08-24 NOTE — ED Notes (Signed)
Amiodarone, NS and Magnesium sulfate cleared by Angela Nevin, pharmacist to be infused at the same site. Per pharmacist, all 3 meds are compatible. Cards team at bedside. Made aware that pt still has his av fistula, venous site accessed and can be used with order and clearance from nephro. Will continue to monitor. Pt remains alert and oriented x 4.

## 2022-08-24 NOTE — ED Notes (Signed)
Pt was defib x 2 at 200j by Dr.Ravi at this time.

## 2022-08-24 NOTE — ED Notes (Signed)
[employer] cabin crew is not compatible with Victor Thompson. Thompson placement is left sided on rib area. Dr.Floyd is aware.

## 2022-08-24 NOTE — Progress Notes (Addendum)
Getting ready to Deaccess pt Patients dialysis catheters from graft and ICD gave patient a shock - unable to safely deaccess while patient is having issues with his ICD - Dr Tyrone Nine & RN aware. Pt had another shock at 1246. That was 3rd shock When pt has a shock he screams and both arms flailed up over his head. Dialysis cath site clean and dry - still accessed,dressing is secure with gauze and tape.

## 2022-08-24 NOTE — ED Notes (Signed)
Central line placement in process by Dr.Ravi.

## 2022-08-24 NOTE — ED Notes (Signed)
Pt was shocked by his AICD x 5 in a span of 18mnutes. MD is aware. Cardiology team at bedside. Observed when pt was shocked x 3 by this AICD. Will continue to monitor.

## 2022-08-24 NOTE — Progress Notes (Signed)
Transported pt from ED to [facility] on ventilator. Pt stable throughout with no complications.

## 2022-08-24 NOTE — Progress Notes (Signed)
Heart Failure Navigator Progress Note  Assessed for Heart & Vascular TOC clinic readiness.  Patient does not meet criteria due to Advanced Heart Failure Team (Dr. Aundra Dubin).    Earnestine Leys, BSN, Clinical cytogeneticist Only

## 2022-08-24 NOTE — Progress Notes (Signed)
  Dr Quentin Ore has seen.  Unfortunately no other options for anti arrhythmics.   Procainamide has no oral equivalent so we can only use  to hopefully temporize him for now.  Not a candidate for ablation with incessant VT.   One other option would be mechanical support, but with ESRD he has no good end point from this.  Personally discussed difficulty of situation with family.   His prognosis is extrem ely guarded, and likely poor in the short term.  Legrand Como [address] [place], [place]

## 2022-08-24 NOTE — ED Notes (Signed)
Pt went to vtach at this time and his AICD shocked him. HR went up to 229 bpm and pt became lethargic at this time. Pt HR decreased to 120s bpm after pt was shocked by his AICD. Will continue to monitor.

## 2022-08-24 NOTE — ED Notes (Signed)
Cardiology team made aware of pt bp. IVF bolus in place.

## 2022-08-24 NOTE — ED Notes (Signed)
Dr. Floyd at bedside. 

## 2022-08-24 NOTE — ED Notes (Signed)
Preparing for central line at this time with Dr.Ravi.

## 2022-08-24 NOTE — ED Notes (Signed)
IV team came to deaccessed fistula but pt was having aicd shocks at this time. Canceled at this time.

## 2022-08-24 NOTE — Procedures (Signed)
Arterial Catheter Insertion Procedure Note  Victor Thompson  [id number]  [birth year]  Date:08/24/22  Time:2:29 PM    Provider Performing: Esperanza Sheets T    Procedure: Insertion of Arterial Line [phone]) without US guidance  Indication(s) Blood pressure monitoring and/or need for frequent ABGs  Consent Unable to obtain consent due to emergent nature of procedure.  Anesthesia None   Time Out Verified patient identification, verified procedure, site/side was marked, verified correct patient position, special equipment/implants available, medications/allergies/relevant history reviewed, required imaging and test results available.   Sterile Technique Maximal sterile technique including full sterile barrier drape, hand hygiene, sterile gown, sterile gloves, mask, hair covering, sterile ultrasound probe cover (if used).   Procedure Description Area of catheter insertion was cleaned with chlorhexidine and draped in sterile fashion. Without real-time ultrasound guidance an arterial catheter was placed into the right radial artery.  Appropriate arterial tracings confirmed on monitor.     Complications/Tolerance None; patient tolerated the procedure well.   EBL Minimal   Specimen(s) None

## 2022-08-24 NOTE — ED Triage Notes (Signed)
Vtach at 220s per EMS. Amiodarone given. Pt HR ranges from 115-180 bpm. Alert and oriented x 4. Pt was 30 minutes short to finish dialysis. AICD in place.

## 2022-08-24 NOTE — ED Notes (Signed)
A-line in place at this time by RT.

## 2022-08-24 NOTE — Progress Notes (Signed)
ABG results obtained on the following vent settings:  vT 580cc RR 18 PEEP 5 FIO2 60% Pt not breathing over.  vT decreased to 8cc 520 post ABG, FIO2 decreased to 40%.   Latest Reference Range & Units 08/24/22 14:35  Sample type  ARTERIAL  pH, Arterial 7.35 - 7.45  7.477 (H)  pCO2 arterial 32 - 48 mmHg 41.9  pO2, Arterial 83 - 108 mmHg 216 (H)  TCO2 22 - 32 mmol/L 32  Acid-Base Excess 0.0 - 2.0 mmol/L 7.0 (H)  Bicarbonate 20.0 - 28.0 mmol/L 31.0 (H)  O2 Saturation % 100  Sodium 135 - 145 mmol/L 130 (L)  Potassium 3.5 - 5.1 mmol/L 3.9  Calcium Ionized 1.15 - 1.40 mmol/L 1.00 (L)  Hemoglobin 13.0 - 17.0 g/dL 10.5 (L)  HCT 39.0 - 52.0 % 31.0 (L)  (H): Data is abnormally high (L): Data is abnormally low

## 2022-08-24 NOTE — H&P (Addendum)
ELECTROPHYSIOLOGY H&P NOTE    Patient ID: Victor Thompson MRN: [id number], DOB/AGE: 04-03-1961 39 y.o.  Admit date: 08/24/2022 Date of Consult: 08/24/2022  Primary Physician: [name], MD Primary Cardiologist: Loralie Champagne, MD  Electrophysiologist: Dr. Lovena Le   Reason for Admission: VT Storm   Patient Profile: Victor Thompson is a 61 y.o. male with a hx of  hx of VF arrest (2015), CAD (PCI in 2005, unknown details), ICM,. Chronic CHF (systolic), ESRF on HD , OSA w/CPAP, HTN, HLD, recurrent angio edema who is being seen today for the evaluation of VT at the request of Dr Tyrone Nine.    CADwith prior PCI though cath Aug 2022 noted no obstructive disease Cardiolite 2017: Inferior and inferolateral large fixed defect suggestive of prior infarction, EF 28% Last Echo was Nov 2022, known reduced LVEF 25-30% (unchanged from 2021   Device information BSCi S-ICD implanted  elsewhere 04/11/2014 > gen change 10/13/2020 + Hx of appropriate therapies Amiodarone started June 2023  HPI:  Victor Thompson is a 61 y.o. male with medical history as above.   Mr. Ke was recently here in June and again in September for VT/ICD shocks. He was observed then to have recurrent NSPMVT he was volume OL required urgent HD with surges of VT that did better afterwards as well as the addition of amiodarone.   Recently admitted 9/11 - 9/16 with recurrent VT and reloaded on IV amiodarone.   PYP Scan 08/19/22 IMPRESSION: Visual and quantitative assessment (grade 0, H/CL = 0.94) is not suggestive of transthyretin amyloidosis. Significant tracer localization in the far anterior chest at the expected position of the sternum/LEFT parasternal region. This may reflect sternal osseous uptake, presternal wound, or anterior chest wall trauma but anterior mediastinal uptake/mass not entirely excluded (though considered less likely); recommend CT imaging to determine etiology.   Planned for PET scan as outpatient.   Pt  returns today from HD with sensation of tachy palpitations. Mostly finished HD apart from 30 minutes at the end.  He felt faint and was suddenly shocked by his ICD. EMS was called who noted him to be in a wide complex tachycardia and initiated IV amiodarone through his HD access.   Potassium3.4* (09/19 1107) Magnesium  2.0 (09/19 1040) Creatinine, ser  9.30* (09/19 1107) PLT  251 (09/19 1040) HGB  12.6* (09/19 1107) WBC 5.4 (09/19 1040) Troponin I (High Sensitivity)40* (09/19 1040).    Pt reports taking all of his medications this morning and nearly completing dialysis when his symptoms started. Currently, he remains in and out of a wide complex rhythm in the 130-170s, no further ICD shocks at initial evaluation. Pt feeling generally unwell currently with tachycardia.   Shortly after initial examination pt started to have further ICD shocks, 7-8 total. Rebolused amiodarone and given lopressor x 2 with some regulation of his arrhyhtmia. Dr. Aundra Dubin in to see as well at our request.   PMVT has thus far remained refractory to shocks.    Past Medical History:  Diagnosis Date   Acute on chronic systolic CHF (congestive heart failure) ([place]) 06/14/2020   Acute respiratory failure ([place]) 05/28/2022   AICD (automatic cardioverter/defibrillator) present    2015   Allergy    Asthma    no meds   Cardiac arrest [facility]) 2015   Chest pain 05/25/2022   CHF (congestive heart failure) (H[facility])    Chronic kidney disease    ckd -stage 5   Coronary artery disease    Hyperlipidemia  Hypertension    Myocardial infarction [facility])    Obesity    Pneumonia    Pulmonary edema    Pulmonary edema 05/28/2022   Shortness of breath dyspnea    Sleep apnea    USES CPAP   Wears glasses      Surgical History:  Past Surgical History:  Procedure Laterality Date   AV FISTULA PLACEMENT Right 03/15/2019   Procedure: RIGHT ARM ARTERIOVENOUS (AV) FISTULA CREATION;  Surgeon: Angelia Mould, MD;  Location: [place];   Service: Vascular;  Laterality: Right;   COLONOSCOPY     COLONOSCOPY W/ BIOPSIES AND POLYPECTOMY     CORONARY STENT PLACEMENT  2007   IMPLANTABLE CARDIOVERTER DEFIBRILLATOR IMPLANT  2015   RIGHT/LEFT HEART CATH AND CORONARY ANGIOGRAPHY N/A 07/08/2021   Procedure: RIGHT/LEFT HEART CATH AND CORONARY ANGIOGRAPHY;  Surgeon: Larey Dresser, MD;  Location: [place] CV LAB;  Service: Cardiovascular;  Laterality: N/A;   SUBQ ICD CHANGEOUT N/A 10/13/2020   Procedure: SUBQ ICD CHANGEOUT;  Surgeon: Evans Lance, MD;  Location: [place] CV LAB;  Service: Cardiovascular;  Laterality: N/A;     (Not in a hospital admission)   Inpatient Medications:   potassium chloride  40 mEq Oral Once    Allergies:  Allergies  Allergen Reactions   Ace Inhibitors Swelling    Swelling of the tongue   Influenza Vaccines Hives and Swelling    SWELLING REACTION UNSPECIFIED    Ketorolac Swelling    SWELLING REACTION UNSPECIFIED    Lidocaine Swelling    TONGUE SWELLS   Lisinopril Swelling    TONGUE SWELLING Pt reported problem with a BP med which sounded like  lisinopril  But as of 09/19/06,pt had tolerated altace without problem   Penicillins Swelling    TONGUE SWELLS Has patient had a PCN reaction causing immediate rash, facial/tongue/throat swelling, SOB or lightheadedness with hypotension: Yes Has patient had a PCN reaction causing severe rash involving mucus membranes or skin necrosis: No Has patient had a PCN reaction that required hospitalization No Has patient had a PCN reaction occurring within the last 10 years: Yes If all of the above answers are "NO", then may proceed with Cephalosporin use.     Social History   Socioeconomic History   Marital status: Single    Spouse name: Not on file   Number of children: 3   Years of education: Not on file   Highest education level: Not on file  Occupational History   Occupation: retired  Tobacco Use   Smoking status: Never   Smokeless  tobacco: Never  Vaping Use   Vaping Use: Never used  Substance and Sexual Activity   Alcohol use: No   Drug use: No   Sexual activity: Yes  Other Topics Concern   Not on file  Social History Narrative   ** Merged History Encounter **       Social Determinants of Health   Financial Resource Strain: Not on file  Food Insecurity: No Food Insecurity (08/23/2022)   Hunger Vital Sign    Worried About Running Out of Food in the Last Year: Never true    Ran Out of Food in the Last Year: Never true  Transportation Needs: No Transportation Needs (08/23/2022)   PRAPARE - Hydrologist (Medical): No    Lack of Transportation (Non-Medical): No  Physical Activity: Insufficiently Active (08/20/2022)   Exercise Vital Sign    Days of Exercise per Week: 1 day  Minutes of Exercise per Session: 10 min  Stress: Not on file  Social Connections: Not on file  Intimate Partner Violence: Not At Risk (08/16/2022)   Humiliation, Afraid, Rape, and Kick questionnaire    Fear of Current or Ex-Partner: No    Emotionally Abused: No    Physically Abused: No    Sexually Abused: No     Family History  Problem Relation Age of Onset   Hypertension Mother    Liver disease Father    Colon cancer Neg Hx    Esophageal cancer Neg Hx    Rectal cancer Neg Hx    Stomach cancer Neg Hx      Review of Systems: All other systems reviewed and are otherwise negative except as noted above.  Physical Exam: Vitals:   08/24/22 1055 08/24/22 1100 08/24/22 1115 08/24/22 1145  BP: 110/66 (!) 118/59 118/80 (!) 105/58  Pulse: (!) 153 (!) 135 (!) 110 (!) 127  Resp: (!) '22 13 10 15  '$ Temp:      TempSrc:      SpO2: 99% 99% 99% 99%  Weight:      Height:        GEN- The patient is well appearing, alert and oriented x 3 today.   HEENT: normocephalic, atraumatic; sclera clear, conjunctiva pink; hearing intact; oropharynx clear; neck supple Lungs- Clear to ausculation bilaterally, normal work of  breathing.  No wheezes, rales, rhonchi Heart- Regular rate and rhythm, no murmurs, rubs or gallops GI- soft, non-tender, non-distended, bowel sounds present Extremities- no clubbing, cyanosis, or edema; DP/PT/radial pulses 2+ bilaterally MS- no significant deformity or atrophy Skin- warm and dry, no rash or lesion Psych- euthymic mood, full affect Neuro- strength and sensation are intact  Labs:   Lab Results  Component Value Date   WBC 5.4 08/24/2022   HGB 12.6 (L) 08/24/2022   HCT 37.0 (L) 08/24/2022   MCV 98.6 08/24/2022   PLT 251 08/24/2022    Recent Labs  Lab 08/24/22 1040 08/24/22 1107  NA 139 138  K 3.5 3.4*  CL 94* 96*  CO2 29  --   BUN 31* 34*  CREATININE 8.59* 9.30*  CALCIUM 8.7*  --   PROT 7.6  --   BILITOT 0.5  --   ALKPHOS 62  --   ALT 18  --   AST 15  --   GLUCOSE 102* 98      Radiology/Studies: DG Chest Port 1 View  Result Date: 08/24/2022 CLINICAL DATA:  Ventricular tachycardia EXAM: PORTABLE CHEST 1 VIEW COMPARISON:  08/16/2022 FINDINGS: Subcutaneous ICD noted. Distal tip about the vertical level of the upper aortic arch. Mild cardiomegaly. The lungs appear clear. Previous signs of pulmonary venous hypertension are no longer readily apparent. No blunting of the costophrenic angles. Thoracic spondylosis. IMPRESSION: 1. Mild cardiomegaly, without edema. 2. Subcutaneous ICD noted. Electronically Signed   By: Van Clines M.D.   On: 08/24/2022 10:41   NM CARDIAC AMYLOID TUMOR LOC INFLAM SPECT 1 DAY  Result Date: 08/20/2022 CLINICAL DATA:  Heart failure, cardiomyopathy, question cardiac amyloidosis. EXAM: NUCLEAR MEDICINE TUMOR LOCALIZATION. PYP CARDIAC AMYLOIDOSIS SCAN WITH SPECT TECHNIQUE: Following intravenous administration of radiopharmaceutical, anterior planar images of the chest were obtained. Regions of interest were placed on the heart and contralateral chest wall for quantitative assessment. Additional SPECT imaging of the chest was obtained.  RADIOPHARMACEUTICALS:  21.3 mCi Tc-69mpyrophosphate IV FINDINGS: Planar Visual assessment: Anterior planar imaging demonstrates no significant tracer uptake within the heart (grade 0).  Quantitative assessment : Quantitative assessment of the cardiac uptake compared to the contralateral chest wall is equal to 0.94 (H/CL = 0.94). SPECT assessment: SPECT imaging of the chest demonstrates no radiotracer accumulation within the LEFT ventricle. However significant tracer localization is seen far anteriorly in the chest. IMPRESSION: Visual and quantitative assessment (grade 0, H/CL = 0.94) is not suggestive of transthyretin amyloidosis. Significant tracer localization in the far anterior chest at the expected position of the sternum/LEFT parasternal region. This may reflect sternal osseous uptake, presternal wound, or anterior chest wall trauma but anterior mediastinal uptake/mass not entirely excluded (though considered less likely); recommend CT imaging to determine etiology. Electronically Signed   By: Lavonia Dana M.D.   On: 08/20/2022 13:47   DG Chest Port 1 View  Result Date: 08/16/2022 CLINICAL DATA:  Chest pain. EXAM: PORTABLE CHEST 1 VIEW COMPARISON:  05/25/2022 FINDINGS: 0611 hours. The cardio pericardial silhouette is enlarged. There is pulmonary vascular congestion without overt pulmonary edema. Left-sided ICD again noted. Telemetry leads overlie the chest. IMPRESSION: Enlargement of the cardiopericardial silhouette with pulmonary vascular congestion. Electronically Signed   By: Misty Stanley M.D.   On: 08/16/2022 06:23    EKG: on arrival shows wide QRS tachycardia at 176 bpm (personally reviewed)  TELEMETRY: wide QRS tachycardia 110-170s (personally reviewed)  DEVICE HISTORY:  BSCi S-ICD implanted  elsewhere 04/11/2014 > gen change 10/13/2020 + Hx of appropriate therapies Amiodarone started June 2023  Assessment/Plan: 1.  VT storm With recent history of the same Potassium3.4* (09/19 1107)  Supp Magnesium  2.0 (09/19 1040) Supp Keep K > 4.0 and Mg > 2.0  Re-bolus amiodarone. Will keep at 60 mg/hr for now.  Has allergy listed to lidocaine. With multiple sources of recurrent angioedema, would not rechallenge.  With upwards of 8 shocks in the ER, may need to consider intubation and sedation if cannot cool off on BB and amiodarone.  Bolus lopressor as needed. Follow BP  2. Chronic systolic CHF Coronary angiography in 07/2021 showed no significant CAD, and also did not appear to show prior "stent" which had been reported.  Troponins have been flat this and last admission.  Volume status OK s/p HD this am (sans 30 minutes)  PYP scan negative  Planning for PET as outpatient for possible Sarcoid.  Dr. Aundra Dubin has also seen and will plan for [facility] tomorrow.  ECG with IVCD 150 msec, thought not to be good candidate for CRT upgrade (not true LBBB)  3. ESRD Will ask nephrology to follow.  Turned down for renal tx due to low EF.  Underwent HD today sans 30 minutes.  Will admit to ICU.   If VT remains refractory with continued shocks, may ultimately require intubation and sedation.  Appears to have had angioedema to Lidocaine that he specifically remembers, so does not appear to be an option we can use.   HF team to follow  Patient is critically ill and in danger of multiple system organ failure.    ADDENDUM Pt had incessant VT with recurrent S-ICD shocks. Magnet placed and engaged CCM and HF team to potentially intubated and sedate.   Pt then had VF with loss of consciousness and was externally cardioverted to sinus, but continued to have VT and further external defibrillations via ZOLL.  Pt was intubated and sedated. He was also started on procainamide given lidocaine allergy.  For questions or updates, please contact [place] Please consult [email or web address] for contact info under Cardiology/STEMI.  Signed, Shirley Friar, PA-C  08/24/2022 12:27 PM

## 2022-08-24 NOTE — ED Notes (Addendum)
Preparing for intubation at this time. RT, [name], trauma RN, [name], primary RN and cardiology team at bedside. Pharmacist has meds ready for intubation. CCM at bedside explaining to pt intubation process and benefits. [place]ag and suction in place. Pt continues to be alert and oriented x 4.

## 2022-08-24 NOTE — Consult Note (Signed)
NAME:  Victor Thompson, MRN:  [id number], DOB:  11/28/1961, LOS: 0 ADMISSION DATE:  08/24/2022, CONSULTATION DATE:  08/24/2022 REFERRING MD:  Aundra Dubin - HF, CHIEF COMPLAINT:  VT storm.    History of Present Illness:  61 year old man who presented from HD with palpitations. Felt faint on HD today.   In ED , recurrent episodes of VT with repeated [place] IVD discharges.  Denied chest pain or  dyspnea.   EF 25-30%. Followed by Dr Aundra Dubin.  Prior episodes of VT, on amiodarone chronically.   Pertinent  Medical History   Past Medical History:  Diagnosis Date   Acute on chronic systolic CHF (congestive heart failure) ([place]) 06/14/2020   Acute respiratory failure ([facility]) 05/28/2022   AICD (automatic cardioverter/defibrillator) present    2015   Allergy    Asthma    no meds   Cardiac arrest [facility]) 2015   Chest pain 05/25/2022   CHF (congestive heart failure) (HCC)    Coronary artery disease    ESRD on hemodialysis ([facility])    ckd -stage 5   Hyperlipidemia    Hypertension    Myocardial infarction ([place])    Obesity    Pneumonia    Shortness of breath dyspnea    Sleep apnea    USES CPAP   Wears glasses    Significant Hospital Events: Including procedures, antibiotic start and stop dates in addition to other pertinent events   9/19 - intubated, central line placed  Interim History / Subjective:  Repeated episodes of VT requiring DCCV  Objective   Blood pressure 97/73, pulse (!) 49, temperature (!) 96.8 F (36 C), resp. rate 18, height '5\' 7"'$  (1.702 m), weight 119 kg, SpO2 99 %.    Vent Mode: PRVC FiO2 (%):  [40 %-60 %] 40 % Set Rate:  [18 bmp] 18 bmp Vt Set:  [520 mL-580 mL] 520 mL PEEP:  [5 cmH20] 5 cmH20 Plateau Pressure:  [20 cmH20-24 cmH20] 24 cmH20   Intake/Output Summary (Last 24 hours) at 08/24/2022 1830 Last data filed at 08/24/2022 1508 Gross per 24 hour  Intake 750 ml  Output --  Net 750 ml   Filed Weights   08/24/22 1031  Weight: 119 kg    Examination: General: obese,  interactive.  HENT: no proptosis, no thyromegaly.  Lungs: chest clear .  Cardiovascular: Extremities warm. HS distant.  Abdomen: distended Extremities: Minimal edema Neuro: Intact prior to intubation.  GU: deferred.  Ancillary tests personally reviewed:   CXR: shows clear lung fields with all lines in place.  Magnesium 2.  TSH 5.964 Assessment & Plan:  Critically ill due electrical storm in context of NICM requiring repeated DCCV - Intubated, sedated and paralyzed to ablate sympathetic tone. - Continue IV amiodarone.  - Control of VT finally achieved following procainamide load.  - Start enteral mexiletine.  Critically ill due to cardiogenic and distributive shock requiring titration vasopressors.  - Titrate VP and NE to keep MAP >65. Avoid excessive doses of NE as may exacerbate VT.  ESRD on HD - May require temporary CRRT  Acute hypoxic respiratory failure - Maintain full ventilatory support.  Best Practice (right click and "Reselect all SmartList Selections" daily)   Diet/type: NPO w/ meds via tube DVT prophylaxis: prophylactic heparin  GI prophylaxis: PPI Lines: Central line and Arterial Line Foley:  Yes, and it is still needed Code Status:  full code Last date of multidisciplinary goals of care discussion [family updated]  CRITICAL CARE Performed by: Einar Grad  Amayiah Gosnell   Total critical care time: 60 minutes  Critical care time was exclusive of separately billable procedures and treating other patients.  Critical care was necessary to treat or prevent imminent or life-threatening deterioration.  Critical care was time spent personally by me on the following activities: development of treatment plan with patient and/or surrogate as well as nursing, discussions with consultants, evaluation of patient's response to treatment, examination of patient, obtaining history from patient or surrogate, ordering and performing treatments and interventions, ordering and review of  laboratory studies, ordering and review of radiographic studies, pulse oximetry, re-evaluation of patient's condition and participation in multidisciplinary rounds.  Kipp Brood, MD [facility] ICU Physician [place]  Pager: [phone] Mobile: [phone] After hours: [phone].

## 2022-08-24 NOTE — ED Notes (Signed)
Additional shock by AICD x 3 at this time with a total of 8 shocks by AICD in ED and 1 shock when pt was with EMS. Cardiology team at bedside at this time. Will continue to monitor.

## 2022-08-24 NOTE — Progress Notes (Signed)
Patient previously seen in ED by IV team RN Everlene Other who stated that it was unsafe to deaccess graft at that time.  Arrived to remove graft needles now but per bedside nurse the needles have already been removed by HD nurse.

## 2022-08-24 NOTE — ED Provider Notes (Signed)
[facility] EMERGENCY DEPARTMENT Provider Note   CSN: [id number] Arrival date & time: 08/24/22  1015     History  Chief Complaint  Patient presents with   Abnormal ECG    Victor Thompson is a 61 y.o. male.  61 yo M with a chief complaints of a sensation like his heart is racing.  This has been going on since this morning.  The patient tells me he was at dialysis and then suddenly felt like it went really fast and then he felt faint and was suddenly shocked by his ICD.  EMS was called today and he was found to be in a very rapid wide rhythm.  Seems to be irregular in nature.  Was given a bolus dose of amiodarone through his dialysis access with some moderate slowing of his rate.  Patient otherwise denies any chest pain denies difficulty breathing denies nausea or vomiting.  He had received all but 30 minutes of his dialysis this morning.  He denies any significant fluid overload recently.          Home Medications Prior to Admission medications   Medication Sig Start Date End Date Taking? Authorizing Provider  albuterol (PROVENTIL) (2.5 MG/3ML) 0.083% nebulizer solution TAKE 3 MLS (2.5 MG TOTAL) BY NEBULIZATION EVERY 6 (SIX) HOURS AS NEEDED FOR WHEEZING OR SHORTNESS OF BREATH. 12/30/21 12/30/22 Yes [name], MD  albuterol (VENTOLIN HFA) 108 (90 Base) MCG/ACT inhaler Inhale 2 puffs into the lungs every 4 (four) hours as needed for wheezing or shortness of breath. 10/01/19  Yes [name], MD  amiodarone (PACERONE) 200 MG tablet Take 2 tabs (400 mg) twice daily for 14 days (now through Sep 30). Then take 1 tab (200 mg) once daily (starting Oct 1) 08/21/22  Yes [name] T, PA-C  aspirin EC 81 MG tablet Take 1 tablet (81 mg total) by mouth daily. 01/22/16  Yes [name], Josalyn, MD  atorvastatin (LIPITOR) 80 MG tablet Take 1 tablet (80 mg total) by mouth at bedtime. 06/25/22 06/25/23 Yes Larey Dresser, MD  AURYXIA 1 GM 210 MG(Fe) tablet Take 1 tablet  (210 mg total) by mouth 3 (three) times daily. 08/12/21  Yes [name], MD  calcium acetate (PHOSLO) 667 MG capsule Take 2 capsule by mouth three times a day with meals 06/09/21  Yes   Cholecalciferol (VITAMIN D-3) 125 MCG (5000 UT) TABS Take 5,000 Units by mouth daily.   Yes [provider]  docusate sodium (COLACE) 100 MG capsule Take 1 capsule (100 mg total) by mouth 2 (two) times daily. 02/15/22  Yes [name], MD  EPINEPHrine 0.3 mg/0.3 mL IJ SOAJ injection Inject 0.3 mg into the muscle once as needed for up to 2 doses (if worsening tongue swelling, SOB, hypoxia, or other concerns for progressive anaphylaxis). 07/15/21  Yes [name], MD  ethyl chloride spray SPRAY A SMALL AMOUNT THREE TIMES A WEEK JUST PRIOR TO NEEDLE INSERTION 10/26/21  Yes [name], MD  hydrOXYzine (ATARAX) 25 MG tablet Take 1-2 tablets (25-50 mg total) by mouth at bedtime as needed for anxiety/insomnia. 08/23/22  Yes Mayers, Cari S, PA-C  polyethylene glycol (MIRALAX) 17 g packet Take 17 g by mouth daily. Titrate up as needed 05/05/22  Yes Armbruster, Carlota Raspberry, MD  Rectal Protectant-Emollient (CALMOL-4) 76-10 % SUPP Use as directed once to twice daily 06/18/22  Yes [name], MD  SYMBICORT 80-4.5 MCG/ACT inhaler INHALE TWO PUFFS INTO THE LUNGS  TWICE DAILY (BULK) 03/10/22  Yes [name], MD  VELPHORO 500 MG chewable tablet Chew 1,000 mg by mouth 3 (three) times daily. 08/06/22  Yes [provider]  carvedilol (COREG) 6.25 MG tablet Take 1 tablet (6.25 mg total) by mouth 2 (two) times daily. 08/21/22   [name] T, PA-C  cetirizine (ZYRTEC) 10 MG tablet Take 1 tablet (10 mg total) by mouth 2 (two) times daily. 11/12/21   Valentina Shaggy, MD  fluticasone [facility]) 50 MCG/ACT nasal spray Place into both nostrils as needed for allergies or rhinitis.    [provider]      Allergies    Ace inhibitors, Influenza vaccines, Ketorolac, Lidocaine,  Lisinopril, and Penicillins    Review of Systems   Review of Systems  Physical Exam Updated Vital Signs BP (!) 87/72   Pulse (!) 133   Temp 98 F (36.7 C) (Oral)   Resp (!) 23   Ht '5\' 7"'$  (1.702 m)   Wt 119 kg   SpO2 100%   BMI 41.09 kg/m  Physical Exam Vitals and nursing note reviewed.  Constitutional:      Appearance: He is well-developed.     Comments: BMI 41  HENT:     Head: Normocephalic and atraumatic.  Eyes:     Pupils: Pupils are equal, round, and reactive to light.  Neck:     Vascular: No JVD.  Cardiovascular:     Rate and Rhythm: Normal rate and regular rhythm.     Heart sounds: No murmur heard.    No friction rub. No gallop.  Pulmonary:     Effort: No respiratory distress.     Breath sounds: No wheezing.  Abdominal:     General: There is no distension.     Tenderness: There is no abdominal tenderness. There is no guarding or rebound.  Musculoskeletal:        General: Normal range of motion.     Cervical back: Normal range of motion and neck supple.     Comments: Right AV fistula with palpable thrill and currently accessed.  Skin:    Coloration: Skin is not pale.     Findings: No rash.  Neurological:     Mental Status: He is alert and oriented to person, place, and time.  Psychiatric:        Behavior: Behavior normal.     ED Results / Procedures / Treatments   Labs (all labs ordered are listed, but only abnormal results are displayed) Labs Reviewed  COMPREHENSIVE METABOLIC PANEL - Abnormal; Notable for the following components:      Result Value   Chloride 94 (*)    Glucose, Bld 102 (*)    BUN 31 (*)    Creatinine, Ser 8.59 (*)    Calcium 8.7 (*)    GFR, Estimated 7 (*)    Anion gap 16 (*)    All other components within normal limits  CBC WITH DIFFERENTIAL/PLATELET - Abnormal; Notable for the following components:   RBC 3.63 (*)    Hemoglobin 12.2 (*)    HCT 35.8 (*)    RDW 15.8 (*)    All other components within normal limits  CBG  MONITORING, ED - Abnormal; Notable for the following components:   Glucose-Capillary 104 (*)    All other components within normal limits  I-STAT CHEM 8, ED - Abnormal; Notable for the following components:   Potassium 3.4 (*)    Chloride 96 (*)    BUN  34 (*)    Creatinine, Ser 9.30 (*)    Calcium, Ion 0.91 (*)    Hemoglobin 12.6 (*)    HCT 37.0 (*)    All other components within normal limits  TROPONIN I (HIGH SENSITIVITY) - Abnormal; Notable for the following components:   Troponin I (High Sensitivity) 40 (*)    All other components within normal limits  MAGNESIUM    EKG EKG Interpretation  Date/Time:  Tuesday August 24 2022 10:26:43 EDT Ventricular Rate:  176 PR Interval:  57 QRS Duration: 134 QT Interval:  261 QTC Calculation: 447 R Axis:   182 Text Interpretation: Wide-QRS tachycardia Right bundle branch block Otherwise no significant change Confirmed by [name] [phone]) on 08/24/2022 10:27:54 AM  Radiology DG Chest Port 1 View  Result Date: 08/24/2022 CLINICAL DATA:  Ventricular tachycardia EXAM: PORTABLE CHEST 1 VIEW COMPARISON:  08/16/2022 FINDINGS: Subcutaneous ICD noted. Distal tip about the vertical level of the upper aortic arch. Mild cardiomegaly. The lungs appear clear. Previous signs of pulmonary venous hypertension are no longer readily apparent. No blunting of the costophrenic angles. Thoracic spondylosis. IMPRESSION: 1. Mild cardiomegaly, without edema. 2. Subcutaneous ICD noted. Electronically Signed   By: Van Clines M.D.   On: 08/24/2022 10:41    Procedures Procedures    Medications Ordered in ED Medications  amiodarone (NEXTERONE) 1.8 mg/mL load via infusion 150 mg (150 mg Intravenous Bolus from Bag 08/24/22 1057)    Followed by  amiodarone (NEXTERONE PREMIX) 360-4.14 MG/200ML-% (1.8 mg/mL) IV infusion (60 mg/hr Intravenous New Bag/Given 08/24/22 1059)  magnesium sulfate IVPB 2 g 50 mL (2 g Intravenous New Bag/Given 08/24/22 1253)  potassium  chloride 10 mEq in 100 mL IVPB (has no administration in time range)  metoprolol tartrate (LOPRESSOR) 5 MG/5ML injection (  Not Given 08/24/22 1319)  HYDROmorphone (DILAUDID) injection 0.5 mg (0.5 mg Intravenous Given 08/24/22 1112)  ondansetron (ZOFRAN) injection 4 mg (4 mg Intravenous Given 08/24/22 1112)  potassium chloride SA (KLOR-CON M) CR tablet 40 mEq (40 mEq Oral Given 08/24/22 1254)  HYDROmorphone (DILAUDID) injection 0.5 mg (0.5 mg Intravenous Given 08/24/22 1253)  metoprolol tartrate (LOPRESSOR) injection 5 mg (5 mg Intravenous Given 08/24/22 1258)  sodium chloride 0.9 % bolus 250 mL (250 mLs Intravenous New Bag/Given 08/24/22 1306)  amiodarone (NEXTERONE) IV bolus only 150 mg/100 mL (0 mg Intravenous Stopped 08/24/22 1327)  metoprolol tartrate (LOPRESSOR) injection 5 mg (5 mg Intravenous Given 08/24/22 1306)    ED Course/ Medical Decision Making/ A&P                           Medical Decision Making Amount and/or Complexity of Data Reviewed Labs: ordered. Radiology: ordered. ECG/medicine tests: ordered.  Risk Prescription drug management. Decision regarding hospitalization.   61 yo M with a chief complaints of his ICD firing.  He has been feeling like his heart has been racing off and on.  He was shocked by his device while at dialysis.  On record review the patient was just hospitalized for something similar.  Required prolonged IV course of amiodarone prior to being discharged.  Patient is in a wide rapid rhythm on arrival.  No problem with his blood pressure.  We will load on amiodarone and started on infusion.  Lab work discussed with cardiology.  Chest x-ray independently interpreted by me without focal infiltrate or pneumothorax.  Cardiology evaluated patient at bedside.  Patient was shocked multiple times in a row by  his device.  Improved with second bolus of amiodarone and bolus dose of Lopressor.  Cardiology to admit.  CRITICAL CARE Performed by: Cecilio Asper   Total critical care time: 80 minutes  Critical care time was exclusive of separately billable procedures and treating other patients.  Critical care was necessary to treat or prevent imminent or life-threatening deterioration.  Critical care was time spent personally by me on the following activities: development of treatment plan with patient and/or surrogate as well as nursing, discussions with consultants, evaluation of patient's response to treatment, examination of patient, obtaining history from patient or surrogate, ordering and performing treatments and interventions, ordering and review of laboratory studies, ordering and review of radiographic studies, pulse oximetry and re-evaluation of patient's condition.        Final Clinical Impression(s) / ED Diagnoses Final diagnoses:  Paroxysmal ventricular tachycardia [facility])    Rx / DC Orders ED Discharge Orders     None         [name], DO 08/24/22 1337

## 2022-08-24 NOTE — Consult Note (Addendum)
Renal Service Consult Note [facility]  DONNA SILVERMAN 08/24/2022 Sol Blazing, MD Requesting Physician: Dr. Lovena Le  Reason for Consult: ESRD pt w/ vent tachycardia HPI: The patient is a 61 y.o. year-old w/ hx of HFrEF, sp AICD, hx cardiac arrest, ESRD on HD, HL, HTN, obesity who presented to ED this am to ED per EMS having vtach at 220 rate. Given amiodarone and HR was down to 115-180 bpm. Pt had most of his HD today (taken off 44mn early). Pt has AICD in place. EP team consulted w/ dx of VT storm w/ recent hx of the same. Pt allergic to lidocaine. Trying amiodarone and BB, it not improving may need intubation and sedation. No vol issues, had HD this am. We are asked to see for ESRD.    Pt seen in ICU, now is intubated and sedated w/ soft BP's getting pressors x 2.  Family at bedside. Pt sedated, unable to give any hx.    ROS    Past Medical History  Past Medical History:  Diagnosis Date   Acute on chronic systolic CHF (congestive heart failure) ([facility] 06/14/2020   Acute respiratory failure (HCC) 05/28/2022   AICD (automatic cardioverter/defibrillator) present    2015   Allergy    Asthma    no meds   [place]   Chest pain 05/25/2022   CHF (congestive heart failure) (HCC)    Chronic kidney disease    ckd -stage 5   Coronary artery disease    Hyperlipidemia    Hypertension    Myocardial infarction ([facility]    Obesity    Pneumonia    Pulmonary edema    Pulmonary edema 05/28/2022   Shortness of breath dyspnea    Sleep apnea    USES CPAP   Wears glasses    Past Surgical History  Past Surgical History:  Procedure Laterality Date   AV FISTULA PLACEMENT Right 03/15/2019   Procedure: RIGHT ARM ARTERIOVENOUS (AV) FISTULA CREATION;  Surgeon: DAngelia Mould MD;  [place] [facility]  Service: Vascular;  Laterality: Right;   COLONOSCOPY     COLONOSCOPY W/ BIOPSIES AND POLYPECTOMY     CORONARY STENT PLACEMENT  2007   IMPLANTABLE CARDIOVERTER  DEFIBRILLATOR IMPLANT  2015   RIGHT/LEFT HEART CATH AND CORONARY ANGIOGRAPHY N/A 07/08/2021   Procedure: RIGHT/LEFT HEART CATH AND CORONARY ANGIOGRAPHY;  Surgeon: MLarey Dresser MD;  [facility];  Service: Cardiovascular;  Laterality: N/A;   SUBQ ICD CHANGEOUT N/A 10/13/2020   Procedure: SUBQ ICD CHANGEOUT;  Surgeon: TEvans Lance MD;  [facility];  Service: Cardiovascular;  Laterality: N/A;   Family History  Family History  Problem Relation Age of Onset   Hypertension Mother    Liver disease Father    Colon cancer Neg Hx    Esophageal cancer Neg Hx    Rectal cancer Neg Hx    Stomach cancer Neg Hx    Social History  reports that he has never smoked. He has never used smokeless tobacco. He reports that he does not drink alcohol and does not use drugs. Allergies  Allergies  Allergen Reactions   Ace Inhibitors Swelling    Swelling of the tongue   Influenza Vaccines Hives and Swelling    SWELLING REACTION UNSPECIFIED    Ketorolac Swelling    SWELLING REACTION UNSPECIFIED    Lidocaine Swelling    TONGUE SWELLS   Lisinopril Swelling    TONGUE SWELLING Pt  reported problem with a BP med which sounded like  lisinopril  But as of 09/19/06,pt had tolerated altace without problem   Penicillins Swelling    TONGUE SWELLS Has patient had a PCN reaction causing immediate rash, facial/tongue/throat swelling, SOB or lightheadedness with hypotension: Yes Has patient had a PCN reaction causing severe rash involving mucus membranes or skin necrosis: No Has patient had a PCN reaction that required hospitalization No Has patient had a PCN reaction occurring within the last 10 years: Yes If all of the above answers are "NO", then may proceed with Cephalosporin use.    Home medications Prior to Admission medications   Medication Sig Start Date End Date Taking? Authorizing Provider  albuterol (PROVENTIL) (2.5 MG/3ML) 0.083% nebulizer solution TAKE 3 MLS (2.5 MG  TOTAL) BY NEBULIZATION EVERY 6 (SIX) HOURS AS NEEDED FOR WHEEZING OR SHORTNESS OF BREATH. 12/30/21 12/30/22 Yes [name], MD  albuterol (VENTOLIN HFA) 108 (90 Base) MCG/ACT inhaler Inhale 2 puffs into the lungs every 4 (four) hours as needed for wheezing or shortness of breath. 10/01/19  Yes [name], MD  amiodarone (PACERONE) 200 MG tablet Take 2 tabs (400 mg) twice daily for 14 days (now through Sep 30). Then take 1 tab (200 mg) once daily (starting Oct 1) 08/21/22  Yes [name] T, PA-C  aspirin EC 81 MG tablet Take 1 tablet (81 mg total) by mouth daily. 01/22/16  Yes [name], Josalyn, MD  atorvastatin (LIPITOR) 80 MG tablet Take 1 tablet (80 mg total) by mouth at bedtime. 06/25/22 06/25/23 Yes Larey Dresser, MD  AURYXIA 1 GM 210 MG(Fe) tablet Take 1 tablet (210 mg total) by mouth 3 (three) times daily. 08/12/21  Yes [name], MD  calcium acetate (PHOSLO) 667 MG capsule Take 2 capsule by mouth three times a day with meals Patient taking differently: Take 1,334 mg by mouth 3 (three) times daily with meals. 06/09/21  Yes   carvedilol (COREG) 6.25 MG tablet Take 1 tablet (6.25 mg total) by mouth 2 (two) times daily. 08/21/22  Yes Weaver, Scott T, PA-C  cetirizine (ZYRTEC) 10 MG tablet Take 1 tablet (10 mg total) by mouth 2 (two) times daily. Patient taking differently: Take 10 mg by mouth 2 (two) times daily as needed for allergies. 11/12/21  Yes Valentina Shaggy, MD  Cholecalciferol (VITAMIN D-3) 125 MCG (5000 UT) TABS Take 5,000 Units by mouth daily.   Yes [provider]  docusate sodium (COLACE) 100 MG capsule Take 1 capsule (100 mg total) by mouth 2 (two) times daily. 02/15/22  Yes [name], MD  EPINEPHrine 0.3 mg/0.3 mL IJ SOAJ injection Inject 0.3 mg into the muscle once as needed for up to 2 doses (if worsening tongue swelling, SOB, hypoxia, or other concerns for progressive anaphylaxis). 07/15/21  Yes [name], MD  ethyl chloride spray  SPRAY A SMALL AMOUNT THREE TIMES A WEEK JUST PRIOR TO NEEDLE INSERTION Patient taking differently: Apply 1 Application topically 3 (three) times a week. 10/26/21  Yes [name], MD  fluticasone (FLONASE) 50 MCG/ACT nasal spray Place 1 spray into both nostrils as needed for allergies or rhinitis.   Yes [provider]  hydrOXYzine (ATARAX) 25 MG tablet Take 1-2 tablets (25-50 mg total) by mouth at bedtime as needed for anxiety/insomnia. 08/23/22  Yes Mayers, Cari S, PA-C  polyethylene glycol (MIRALAX) 17 g packet Take 17 g by mouth daily. Titrate up as needed 05/05/22  Yes Armbruster, Carlota Raspberry,  MD  Rectal Protectant-Emollient (CALMOL-4) 76-10 % SUPP Use as directed once to twice daily Patient taking differently: Place 1 suppository rectally 2 (two) times daily as needed (constipation). Use as directed once to twice daily 06/18/22  Yes [name], MD  SYMBICORT 80-4.5 MCG/ACT inhaler INHALE TWO PUFFS INTO THE LUNGS TWICE DAILY (BULK) Patient taking differently: Inhale 2 puffs into the lungs in the morning and at bedtime. 03/10/22  Yes [name], MD  VELPHORO 500 MG chewable tablet Chew 1,000 mg by mouth 3 (three) times daily. 08/06/22  Yes [provider]     Vitals:   08/24/22 1230 08/24/22 1245 08/24/22 1300 08/24/22 1315  BP:   125/71 (!) 87/72  Pulse: (!) 38  (!) 146 (!) 133  Resp: '18 14 16 '$ (!) 23  Temp:      TempSrc:      SpO2: 100%  100% 100%  Weight:      Height:       Exam Gen on vent, sedated No rash, cyanosis or gangrene Sclera anicteric, throat w/ ETT No jvd or bruits Chest clear anterior/ lateral RRR no MRG Abd soft ntnd no mass or ascites +bs GU normal male MS no joint effusions or deformity Ext no LE or UE edema, no wounds or ulcers Neuro is on vent, sedated  AVF+bruit   Home meds include - albuterol, amiodarone, aspirin, atorvastatin, auryxia 1 ac tid, calc acetate 2 ac, carvedilol 6.25 bid, colace, symbicort, velphoro 2 ac tid,  prns/ vits/ supps   OP HD: [place] TTS  4h 38mn  450/ 1.5  116.4kg  AVF  15g  Hep 3000 - hectorol 7 ug iv tiw - no esa   Assessment/ Plan: VT storm / VF - pt intubated and sedated in ICU now. Per cardiology/ EP team.  ESRD - on HD TTS. Had most of his HD at OP unit today, no need for RRT at this time. Next HD would be 9/21.  HFrEF / EF 20-25%/ sp ICD - currently in shock getting pressor support Anemia esrd - Hb 10-12, no esa needs for now MBD ckd - CCa in range, phos a bit high. No binders needed at this time as pt intubated.    Rob Rondy Krupinski  MD 08/24/2022, 5:07 PM Recent Labs  Lab 08/21/22 0800 08/24/22 1040 08/24/22 1107  HGB 10.5* 12.2* 12.6*  ALBUMIN 3.1* 3.5  --   CALCIUM 8.9  8.9 8.7*  --   PHOS 7.0*  --   --   CREATININE 14.77*  14.77* 8.59* 9.30*  K 4.4  4.4 3.5 3.4*   Inpatient medications:  metoprolol tartrate       rocuronium  80 mg Intravenous Once    sodium chloride 250 mL (08/24/22 1403)   amiodarone 60 mg/hr (08/24/22 1059)   norepinephrine (LEVOPHED) Adult infusion 10 mcg/min (08/24/22 1353)   potassium chloride     metoprolol tartrate

## 2022-08-24 NOTE — Procedures (Signed)
Central Venous Catheter Insertion Procedure Note  JAKEVION ARNEY  [id number]  01/10/1961  Date:08/24/22  Time:3:04 PM   Provider Performing:Chandon Lazcano   Procedure: Insertion of Non-tunneled Central Venous Catheter(36556) with US guidance (15726)   Indication(s) Medication administration and Difficult access  Consent Unable to obtain consent due to emergent nature of procedure.  Anesthesia None, patient sedated.   Timeout Verified patient identification, verified procedure, site/side was marked, verified correct patient position, special equipment/implants available, medications/allergies/relevant history reviewed, required imaging and test results available.  Sterile Technique Maximal sterile technique including full sterile barrier drape, hand hygiene, sterile gown, sterile gloves, mask, hair covering, sterile ultrasound probe cover (if used).  Procedure Description Area of catheter insertion was cleaned with chlorhexidine and draped in sterile fashion.  With real-time ultrasound guidance a 37F 20cm  central venous catheter was placed into the left subclavian vein. Nonpulsatile blood flow and easy flushing noted in all ports.  The catheter was sutured in place and sterile dressing applied.  Unable to cannulate via subclavicular approach. Cannulated via supraclavicular approach first pass.  Complications/Tolerance None; patient tolerated the procedure well. Chest X-ray is ordered to verify placement for internal jugular or subclavian cannulation.   Chest x-ray is not ordered for femoral cannulation.  EBL Minimal  Specimen(s) None   Kipp Brood, MD [facility] ICU Physician [place]  Pager: [phone] Or Epic Secure Chat After hours: [phone].  08/24/2022, 3:07 PM

## 2022-08-24 NOTE — ED Notes (Signed)
Pt placed on defib pads and zoll at bedside. Crash cart outside of pt room. Pt continues to be alert and oriented x 4. Reports intermittent chest pain, heart racing and feeling like he is going to pass out. MD is aware. Will continue to monitor.

## 2022-08-25 ENCOUNTER — Inpatient Hospital Stay (HOSPITAL_COMMUNITY): Payer: Medicare Other

## 2022-08-25 ENCOUNTER — Encounter (HOSPITAL_COMMUNITY)
Admission: EM | Disposition: A | Discharge status: Home Health | Payer: Self-pay | Source: Ambulatory Visit | Attending: Internal Medicine

## 2022-08-25 ENCOUNTER — Encounter (HOSPITAL_COMMUNITY): Payer: Self-pay | Admitting: Cardiology

## 2022-08-25 DIAGNOSIS — I472 Ventricular tachycardia, unspecified: Secondary | ICD-10-CM | POA: Diagnosis not present

## 2022-08-25 DIAGNOSIS — I340 Nonrheumatic mitral (valve) insufficiency: Secondary | ICD-10-CM | POA: Diagnosis not present

## 2022-08-25 DIAGNOSIS — R570 Cardiogenic shock: Secondary | ICD-10-CM | POA: Diagnosis not present

## 2022-08-25 DIAGNOSIS — I5021 Acute systolic (congestive) heart failure: Secondary | ICD-10-CM | POA: Diagnosis not present

## 2022-08-25 DIAGNOSIS — I2510 Atherosclerotic heart disease of native coronary artery without angina pectoris: Secondary | ICD-10-CM

## 2022-08-25 HISTORY — PX: RIGHT/LEFT HEART CATH AND CORONARY ANGIOGRAPHY: CATH118266

## 2022-08-25 LAB — RENAL FUNCTION PANEL
Albumin: 3.1 g/dL — ABNORMAL LOW (ref 3.5–5.0)
Albumin: 3 g/dL — ABNORMAL LOW (ref 3.5–5.0)
Anion gap: 19 — ABNORMAL HIGH (ref 5–15)
Anion gap: 18 — ABNORMAL HIGH (ref 5–15)
BUN: 38 mg/dL — ABNORMAL HIGH (ref 6–20)
BUN: 38 mg/dL — ABNORMAL HIGH (ref 6–20)
CO2: 25 mmol/L (ref 22–32)
CO2: 22 mmol/L (ref 22–32)
Calcium: 8.5 mg/dL — ABNORMAL LOW (ref 8.9–10.3)
Calcium: 7.9 mg/dL — ABNORMAL LOW (ref 8.9–10.3)
Chloride: 91 mmol/L — ABNORMAL LOW (ref 98–111)
Chloride: 95 mmol/L — ABNORMAL LOW (ref 98–111)
Creatinine, Ser: 10.4 mg/dL — ABNORMAL HIGH (ref 0.61–1.24)
Creatinine, Ser: 8.94 mg/dL — ABNORMAL HIGH (ref 0.61–1.24)
GFR, Estimated: 5 mL/min — ABNORMAL LOW (ref >60)
GFR, Estimated: 6 mL/min — ABNORMAL LOW
Glucose, Bld: 148 mg/dL — ABNORMAL HIGH (ref 70–99)
Glucose, Bld: 122 mg/dL — ABNORMAL HIGH (ref 70–99)
Phosphorus: 6.1 mg/dL — ABNORMAL HIGH (ref 2.5–4.6)
Phosphorus: 5.6 mg/dL — ABNORMAL HIGH (ref 2.5–4.6)
Potassium: 4.6 mmol/L (ref 3.5–5.1)
Potassium: 4.7 mmol/L (ref 3.5–5.1)
Sodium: 135 mmol/L (ref 135–145)
Sodium: 135 mmol/L (ref 135–145)

## 2022-08-25 LAB — ECHOCARDIOGRAM COMPLETE
AR max vel: 3.88 cm2
AV Area VTI: 3.69 cm2
AV Area mean vel: 3.7 cm2
AV Mean grad: 2 mmHg
AV Peak grad: 4.4 mmHg
Ao pk vel: 1.05 m/s
Area-P 1/2: 3.85 cm2
Height: 67 in
MV M vel: 3.76 m/s
MV Peak grad: 56.6 mmHg
S' Lateral: 6.1 cm
Weight: 4197.56 oz

## 2022-08-25 LAB — POCT I-STAT EG7
Acid-Base Excess: 3 mmol/L — ABNORMAL HIGH (ref 0.0–2.0)
Acid-Base Excess: 4 mmol/L — ABNORMAL HIGH (ref 0.0–2.0)
Bicarbonate: 28.7 mmol/L — ABNORMAL HIGH (ref 20.0–28.0)
Bicarbonate: 28.9 mmol/L — ABNORMAL HIGH (ref 20.0–28.0)
Calcium, Ion: 1.01 mmol/L — ABNORMAL LOW (ref 1.15–1.40)
Calcium, Ion: 1.02 mmol/L — ABNORMAL LOW (ref 1.15–1.40)
HCT: 30 % — ABNORMAL LOW (ref 39.0–52.0)
HCT: 30 % — ABNORMAL LOW (ref 39.0–52.0)
Hemoglobin: 10.2 g/dL — ABNORMAL LOW (ref 13.0–17.0)
Hemoglobin: 10.2 g/dL — ABNORMAL LOW (ref 13.0–17.0)
O2 Saturation: 76 %
O2 Saturation: 77 %
Potassium: 4.8 mmol/L (ref 3.5–5.1)
Potassium: 4.8 mmol/L (ref 3.5–5.1)
Sodium: 132 mmol/L — ABNORMAL LOW (ref 135–145)
Sodium: 132 mmol/L — ABNORMAL LOW (ref 135–145)
TCO2: 30 mmol/L (ref 22–32)
TCO2: 30 mmol/L (ref 22–32)
pCO2, Ven: 46.5 mmHg (ref 44–60)
pCO2, Ven: 46.8 mmHg (ref 44–60)
pH, Ven: 7.398 (ref 7.25–7.43)
pH, Ven: 7.4 (ref 7.25–7.43)
pO2, Ven: 42 mmHg (ref 32–45)
pO2, Ven: 42 mmHg (ref 32–45)

## 2022-08-25 LAB — POCT I-STAT 7, (LYTES, BLD GAS, ICA,H+H)
Acid-Base Excess: 9 mmol/L — ABNORMAL HIGH (ref 0.0–2.0)
Bicarbonate: 32.8 mmol/L — ABNORMAL HIGH (ref 20.0–28.0)
Calcium, Ion: 0.97 mmol/L — ABNORMAL LOW (ref 1.15–1.40)
HCT: 32 % — ABNORMAL LOW (ref 39.0–52.0)
Hemoglobin: 10.9 g/dL — ABNORMAL LOW (ref 13.0–17.0)
O2 Saturation: 100 %
Patient temperature: 36.4
Potassium: 5 mmol/L (ref 3.5–5.1)
Sodium: 131 mmol/L — ABNORMAL LOW (ref 135–145)
TCO2: 34 mmol/L — ABNORMAL HIGH (ref 22–32)
pCO2 arterial: 40 mmHg (ref 32–48)
pH, Arterial: 7.519 — ABNORMAL HIGH (ref 7.35–7.45)
pO2, Arterial: 148 mmHg — ABNORMAL HIGH (ref 83–108)

## 2022-08-25 LAB — CBC WITH DIFFERENTIAL/PLATELET
Abs Immature Granulocytes: 0.06 10*3/uL (ref 0.00–0.07)
Basophils Absolute: 0 10*3/uL (ref 0.0–0.1)
Basophils Relative: 0 %
Eosinophils Absolute: 0.1 10*3/uL (ref 0.0–0.5)
Eosinophils Relative: 1 %
HCT: 30.3 % — ABNORMAL LOW (ref 39.0–52.0)
Hemoglobin: 10.3 g/dL — ABNORMAL LOW (ref 13.0–17.0)
Immature Granulocytes: 1 %
Lymphocytes Relative: 11 %
Lymphs Abs: 0.9 10*3/uL (ref 0.7–4.0)
MCH: 32.9 pg (ref 26.0–34.0)
MCHC: 34 g/dL (ref 30.0–36.0)
MCV: 96.8 fL (ref 80.0–100.0)
Monocytes Absolute: 1.1 10*3/uL — ABNORMAL HIGH (ref 0.1–1.0)
Monocytes Relative: 14 %
Neutro Abs: 5.8 10*3/uL (ref 1.7–7.7)
Neutrophils Relative %: 73 %
Platelets: 236 10*3/uL (ref 150–400)
RBC: 3.13 MIL/uL — ABNORMAL LOW (ref 4.22–5.81)
RDW: 15.7 % — ABNORMAL HIGH (ref 11.5–15.5)
WBC: 7.9 10*3/uL (ref 4.0–10.5)
nRBC: 0 % (ref 0.0–0.2)

## 2022-08-25 LAB — POCT ACTIVATED CLOTTING TIME: Activated Clotting Time: 137 seconds

## 2022-08-25 LAB — MAGNESIUM
Magnesium: 2.5 mg/dL — ABNORMAL HIGH (ref 1.7–2.4)
Magnesium: 2.4 mg/dL (ref 1.7–2.4)

## 2022-08-25 LAB — GLUCOSE, CAPILLARY
Glucose-Capillary: 148 mg/dL — ABNORMAL HIGH (ref 70–99)
Glucose-Capillary: 118 mg/dL — ABNORMAL HIGH (ref 70–99)

## 2022-08-25 LAB — COOXEMETRY PANEL
Carboxyhemoglobin: 1.8 % — ABNORMAL HIGH (ref 0.5–1.5)
Methemoglobin: 0.8 % (ref 0.0–1.5)
O2 Saturation: 82.3 %
Total hemoglobin: 8 g/dL — ABNORMAL LOW (ref 12.0–16.0)

## 2022-08-25 SURGERY — RIGHT/LEFT HEART CATH AND CORONARY ANGIOGRAPHY
Anesthesia: LOCAL

## 2022-08-25 MED ORDER — SODIUM CHLORIDE 0.9 % IV SOLN
500.0000 [IU]/h | INTRAVENOUS | Status: DC
  Administered 2022-08-25: 500 [IU]/h via INTRAVENOUS_CENTRAL
  Administered 2022-08-26 (×2): 750 [IU]/h via INTRAVENOUS_CENTRAL
  Filled 2022-08-25 (×2): qty 2
  Filled 2022-08-25 (×2): qty 10000

## 2022-08-25 MED ORDER — SODIUM CHLORIDE 0.9% FLUSH
3.0000 mL | Freq: Two times a day (BID) | INTRAVENOUS | Status: DC
  Administered 2022-08-26: 3 mL via INTRAVENOUS

## 2022-08-25 MED ORDER — SODIUM CHLORIDE 0.9 % FOR CRRT: INTRAVENOUS_CENTRAL | Status: DC | PRN

## 2022-08-25 MED ORDER — ACETAMINOPHEN 325 MG PO TABS: 650.0000 mg | ORAL_TABLET | ORAL | Status: DC | PRN

## 2022-08-25 MED ORDER — ATORVASTATIN CALCIUM 80 MG PO TABS
80.0000 mg | ORAL_TABLET | Freq: Every day | ORAL | Status: DC
  Administered 2022-08-25 – 2022-08-27 (×3): 80 mg
  Filled 2022-08-25 (×3): qty 1

## 2022-08-25 MED ORDER — ASPIRIN 81 MG PO CHEW
81.0000 mg | CHEWABLE_TABLET | Freq: Every day | ORAL | Status: DC
  Administered 2022-08-26 – 2022-08-27 (×2): 81 mg
  Filled 2022-08-25 (×3): qty 1

## 2022-08-25 MED ORDER — ONDANSETRON HCL 4 MG/2ML IJ SOLN: 4.0000 mg | Freq: Four times a day (QID) | INTRAMUSCULAR | Status: DC | PRN

## 2022-08-25 MED ORDER — SODIUM CHLORIDE 0.9 % IV SOLN: 250.0000 mL | INTRAVENOUS | Status: DC | PRN

## 2022-08-25 MED ORDER — PRISMASOL BGK 4/2.5 32-4-2.5 MEQ/L REPLACEMENT SOLN: Status: DC

## 2022-08-25 MED ORDER — HEPARIN SODIUM (PORCINE) 1000 UNIT/ML DIALYSIS
1000.0000 [IU] | INTRAMUSCULAR | Status: DC | PRN
  Administered 2022-08-27: 2800 [IU] via INTRAVENOUS_CENTRAL
  Filled 2022-08-25: qty 3
  Filled 2022-08-25: qty 6

## 2022-08-25 MED ORDER — SODIUM CHLORIDE 0.9% FLUSH: 3.0000 mL | INTRAVENOUS | Status: DC | PRN

## 2022-08-25 MED ORDER — LABETALOL HCL 5 MG/ML IV SOLN: 10.0000 mg | INTRAVENOUS | Status: AC | PRN

## 2022-08-25 MED ORDER — ALTEPLASE 2 MG IJ SOLR: 2.0000 mg | Freq: Once | INTRAMUSCULAR | Status: DC | PRN

## 2022-08-25 MED ORDER — PRISMASOL BGK 4/2.5 32-4-2.5 MEQ/L EC SOLN: Status: DC

## 2022-08-25 MED ORDER — HEPARIN SODIUM (PORCINE) 5000 UNIT/ML IJ SOLN
5000.0000 [IU] | Freq: Three times a day (TID) | INTRAMUSCULAR | Status: DC
  Administered 2022-08-25 – 2022-08-31 (×17): 5000 [IU] via SUBCUTANEOUS
  Filled 2022-08-25 (×18): qty 1

## 2022-08-25 MED ORDER — AMIODARONE HCL IN DEXTROSE 360-4.14 MG/200ML-% IV SOLN
INTRAVENOUS | Status: AC | PRN
  Administered 2022-08-25: 59.94 mg/h via INTRAVENOUS

## 2022-08-25 MED ORDER — IOHEXOL 350 MG/ML SOLN
INTRAVENOUS | Status: DC | PRN
  Administered 2022-08-25: 55 mL

## 2022-08-25 MED ORDER — HYDRALAZINE HCL 20 MG/ML IJ SOLN: 10.0000 mg | INTRAMUSCULAR | Status: AC | PRN

## 2022-08-25 MED ORDER — PERFLUTREN LIPID MICROSPHERE
1.0000 mL | INTRAVENOUS | Status: AC | PRN
  Administered 2022-08-25: 2 mL via INTRAVENOUS

## 2022-08-25 MED ORDER — AMIODARONE HCL IN DEXTROSE 360-4.14 MG/200ML-% IV SOLN
INTRAVENOUS | Status: AC
  Filled 2022-08-25: qty 200

## 2022-08-25 SURGICAL SUPPLY — 12 items
CATH INFINITI 5 FR 3DRC (CATHETERS) IMPLANT
CATH INFINITI 5FR MULTPACK ANG (CATHETERS) IMPLANT
CATH SWAN GANZ 7F STRAIGHT (CATHETERS) IMPLANT
KIT HEART LEFT (KITS) ×1 IMPLANT
KIT MICROPUNCTURE NIT STIFF (SHEATH) IMPLANT
PACK CARDIAC CATHETERIZATION (CUSTOM PROCEDURE TRAY) ×1 IMPLANT
SET ATX SIMPLICITY (MISCELLANEOUS) IMPLANT
SHEATH PINNACLE 5F 10CM (SHEATH) IMPLANT
SHEATH PINNACLE 7F 10CM (SHEATH) IMPLANT
SHEATH PROBE COVER 6X72 (BAG) IMPLANT
TRANSDUCER W/STOPCOCK (MISCELLANEOUS) ×1 IMPLANT
WIRE EMERALD 3MM-J .035X150CM (WIRE) IMPLANT

## 2022-08-25 NOTE — Progress Notes (Addendum)
Electrophysiology Rounding Note  [name] Date of Encounter: 08/25/2022  Primary Cardiologist: Loralie Champagne, MD Electrophysiologist: [name], MD   Subjective   Pt is intubated, sedated, and paralyzed. NAEO  Inpatient Medications    Scheduled Meds:  artificial tears  1 Application Both Eyes [id number]   aspirin  81 mg Per Tube Daily   atorvastatin  80 mg Per Tube QHS   Chlorhexidine Gluconate Cloth  6 each Topical Daily   cholecalciferol  5,000 Units Per Tube Daily   docusate  100 mg Per Tube BID   fentaNYL (SUBLIMAZE) injection  50 mcg Intravenous Once   heparin injection (subcutaneous)  5,000 Units Subcutaneous Q8H   mouth rinse  15 mL Mouth Rinse Q2H   pantoprazole  40 mg Per Tube Daily   polyethylene glycol  17 g Per Tube Daily   quiNIDine sulfate  200 mg Per Tube Q8H   sodium chloride flush  10-40 mL Intracatheter Q12H   sodium chloride flush  3 mL Intravenous Q12H   sodium chloride flush  3 mL Intravenous Q12H   Continuous Infusions:  sodium chloride     sodium chloride 20 mL/hr at 08/25/22 0700   sodium chloride     sodium chloride     sodium chloride     amiodarone 60 mg/hr (08/25/22 0700)   cisatracurium (NIMBEX) 200 mg in sodium chloride 0.9 % 100 mL (2 mg/mL) infusion 7.5 mcg/kg/min (08/25/22 0700)   fentaNYL infusion INTRAVENOUS 400 mcg/hr (08/25/22 0700)   midazolam 10 mg/hr (08/25/22 0700)   norepinephrine (LEVOPHED) Adult infusion 6 mcg/min (08/25/22 0700)   vasopressin 0.03 Units/min (08/25/22 0700)   PRN Meds: sodium chloride, Place/Maintain arterial line **AND** sodium chloride, sodium chloride, acetaminophen, albuterol, fentaNYL, midazolam, ondansetron (ZOFRAN) IV, mouth rinse, sodium chloride flush, sodium chloride flush, sodium chloride flush   Vital Signs    Vitals:   08/25/22 0400 08/25/22 0500 08/25/22 0600 08/25/22 0634  BP: 99/80 110/76 108/72   Pulse: 62 78 (!) 53   Resp: '18 18 18 18  '$ Temp: (!) 97.3 F (36.3 C)  (!) 97.5 F (36.4 C) (!) 97.5 F (36.4 C) (!) 97.5 F (36.4 C)  TempSrc:      SpO2: 100% 100% 100%   Weight:      Height:        Intake/Output Summary (Last 24 hours) at 08/25/2022 0705 Last data filed at 08/25/2022 0700 Gross per 24 hour  Intake 3005.33 ml  Output --  Net 3005.33 ml   Filed Weights   08/24/22 1031 08/24/22 1623  Weight: 119 kg 119 kg    Physical Exam    GEN- The patient is intubated and sedated. Responds to voice.  HEENT- + ET tube.  Lungs- +mechanical breathing sounds.  Heart- Regular rate and rhythm, no murmurs, rubs or gallops GI- soft Extremities- no clubbing or cyanosis. No edema Skin- no rash or lesion Neuro- intubated and sedated.    Labs    CBC Recent Labs    08/24/22 1040 08/24/22 1107 08/25/22 0414 08/25/22 0430  WBC 5.4  --  7.9  --   NEUTROABS 3.8  --  5.8  --   HGB 12.2*   < > 10.3* 10.9*  HCT 35.8*   < > 30.3* 32.0*  MCV 98.6  --  96.8  --   PLT 251  --  236  --    < > = values in this interval not displayed.   Basic Metabolic Panel  Recent Labs    08/24/22 1040 08/24/22 1107 08/24/22 1435 08/25/22 0414 08/25/22 0430  NA 139 138   < > 135 131*  K 3.5 3.4*   < > 4.6 5.0  CL 94* 96*  --  91*  --   CO2 29  --   --  25  --   GLUCOSE 102* 98  --  148*  --   BUN 31* 34*  --  38*  --   CREATININE 8.59* 9.30*  --  10.40*  --   CALCIUM 8.7*  --   --  8.5*  --   MG 2.0  --   --  2.5*  --   PHOS  --   --   --  6.1*  --    < > = values in this interval not displayed.   Liver Function Tests Recent Labs    08/24/22 1040 08/25/22 0414  AST 15  --   ALT 18  --   ALKPHOS 62  --   BILITOT 0.5  --   PROT 7.6  --   ALBUMIN 3.5 3.1*   No results for input(s): "LIPASE", "AMYLASE" in the last 72 hours. Cardiac Enzymes No results for input(s): "CKTOTAL", "CKMB", "CKMBINDEX", "TROPONINI" in the last 72 hours.   Telemetry    Sinus brady/NSR 50-70s with intermittent ? idioventricular rhythm (personally reviewed)  Radiology     DG Chest Portable 1 View  Result Date: 08/24/2022 CLINICAL DATA:  Post extubation.  Central line placement. EXAM: PORTABLE CHEST 1 VIEW COMPARISON:  AP chest 08/24/2022 at 1028 hours FINDINGS: Pacing device pads overlie the left scapula, axilla, lateral left hemithorax, and lower left hemithorax, limiting evaluation of these regions. Left chest wall electronic device with apparent leads overlying the heart and mediastinum are again seen, compatible with an implantable cardiac defibrillator. New left internal jugular central venous catheter tip overlies the junction of the left brachiocephalic vein and superior vena cava. New endotracheal tube with tip terminating approximately 3.1 cm above the carina. New enteric tube descends below the diaphragm with the tip excluded by collimation. Cardiac silhouette is again mildly enlarged. Mediastinal contours are within normal limits. Mild calcification within the aortic arch. Mild bilateral interstitial thickening is unchanged. No pleural effusion or pneumothorax. Moderate multilevel degenerative bridging osteophytes throughout the thoracic spine. IMPRESSION: 1. Interval placement of left internal jugular central venous catheter with tip overlying the junction of the left brachiocephalic vein and superior vena cava. No pneumothorax. 2. Interval placement of enteric tube which descends below the diaphragm with the tip excluded by collimation. 3. Interval placement of endotracheal tube with tip terminating approximately 3.1 cm above the carina. Electronically Signed   By: Yvonne Kendall M.D.   On: 08/24/2022 15:12   DG Chest Port 1 View  Result Date: 08/24/2022 CLINICAL DATA:  Ventricular tachycardia EXAM: PORTABLE CHEST 1 VIEW COMPARISON:  08/16/2022 FINDINGS: Subcutaneous ICD noted. Distal tip about the vertical level of the upper aortic arch. Mild cardiomegaly. The lungs appear clear. Previous signs of pulmonary venous hypertension are no longer readily apparent.  No blunting of the costophrenic angles. Thoracic spondylosis. IMPRESSION: 1. Mild cardiomegaly, without edema. 2. Subcutaneous ICD noted. Electronically Signed   By: Van Clines M.D.   On: 08/24/2022 10:41    Patient Profile     Victor Thompson is a 61 y.o. male with a hx of  hx of VF arrest (2015), CAD (PCI in 2005, unknown details), ICM,. Chronic CHF (systolic), ESRF on  HD , OSA w/CPAP, HTN, HLD, recurrent angio edema who was admitted for VT storm.   Assessment & Plan    1.  VT storm / refractory VT Quiescent overnight.  Telemetry with continued intermittent accelerated junctional vs idioventricular rhythm.  Received a total of 14 shocks from his device and multiple external defibs as well. Battery now 16% to ERI (was 74% in July) Potassium5.0 (09/20 0430) Magnesium  2.5* (09/20 0414) Creatinine, ser  10.40* (09/20 0414) Keep K > 4.0 and Mg > 2.0  Continue amiodarone.  Plan to start quinidine this am, with options for AADs given allergies. Discussed dosing with PharmD personally re: HD. Has allergy listed to lidocaine. With multiple sources of recurrent angioedema, would not rechallenge.    2. Chronic systolic CHF Coronary angiography in 07/2021 showed no significant CAD, and also did not appear to show prior "stent" which had been reported.  Plan for [facility] today per HF team.  Troponins have been flat this and last admission.  Volume status OK s/p HD this am (sans 30 minutes)  PYP scan negative  Planning for PET as outpatient for possible Sarcoid.  ECG with IVCD 150 msec, thought not to be good candidate for CRT upgrade (not true LBBB)   3. ESRD Nephrology following.   4. Intubation Intubated, sedated and paralyzed for incessant VT with repeated shocks and airway protection Appreciate HF and CCM care.   For questions or updates, please contact [facility] Please consult [email or web address] for contact info under Cardiology/STEMI.  Signed, Shirley Friar, PA-C   08/25/2022, 7:05 AM   EP Attending  Patient seen and examined. Agree with the findings as noted above. He has unfortunately had more VT/VT storm and has required intubation and sedation and pressors in the setting of  IV amiodarone. He cannot receive IV lidocaine due to a h/o angioedema and has been placed on quinidine to help suppress his VT via NG tube. He will undergo heart cath later today. I do not expect that his has developed CAD but right heart cath should give [place] more hemodynamic data and help with prognosis as well. His ICD battery has taken a significant hit with all of the shocks from VT/VF. His prognosis is guarded. Appreciate heart failure team as well as critical care medicine input.  Carleene Overlie Ata Pecha,MD

## 2022-08-25 NOTE — Progress Notes (Addendum)
Advanced Heart Failure Rounding Note  PCP-Cardiologist: Loralie Champagne, MD   Subjective:   -Admitted VT storm   - On norepi 6 mcg + vasopressin 0.03 +amio drip.    Intubated/sedated . No VT overnight.   Objective:   Weight Range: 119 kg Body mass index is 41.09 kg/m.   Vital Signs:   Temp:  [94.5 F (34.7 C)-98 F (36.7 C)] 97.5 F (36.4 C) (09/20 0634) Pulse Rate:  [30-160] 53 (09/20 0600) Resp:  [10-26] 18 (09/20 0634) BP: (67-146)/(36-133) 108/72 (09/20 0600) SpO2:  [95 %-100 %] 100 % (09/20 0600) Arterial Line BP: (69-143)/(42-103) 114/78 (09/20 0634) FiO2 (%):  [40 %-60 %] 40 % (09/20 0339) Weight:  [782 kg] 119 kg (09/19 1623)    Weight change: Filed Weights   08/24/22 1031 08/24/22 1623  Weight: 119 kg 119 kg    Intake/Output:   Intake/Output Summary (Last 24 hours) at 08/25/2022 0702 Last data filed at 08/25/2022 0500 Gross per 24 hour  Intake 2746.67 ml  Output --  Net 2746.67 ml      Physical Exam    General:  Intubated/sedated. Zoll Pads in place HEENT: ETT Neck: Supple. JVP difficult to assess . Carotids 2+ bilat; no bruits. No lymphadenopathy or thyromegaly appreciated. Cor: PMI nondisplaced. Regular rate & rhythm. No rubs, gallops or murmurs. Lungs: Clear Abdomen: Soft, nontender, nondistended. No hepatosplenomegaly. No bruits or masses. Good bowel sounds. Extremities: No cyanosis, clubbing, rash, edema. RUE AVF Neuro: Intubated / sedated  Telemetry   Over night. No VT. SR/SB 50-70s with occasional PVCs.   EKG    N/A  Labs    CBC Recent Labs    08/24/22 1040 08/24/22 1107 08/25/22 0414 08/25/22 0430  WBC 5.4  --  7.9  --   NEUTROABS 3.8  --  5.8  --   HGB 12.2*   < > 10.3* 10.9*  HCT 35.8*   < > 30.3* 32.0*  MCV 98.6  --  96.8  --   PLT 251  --  236  --    < > = values in this interval not displayed.   Basic Metabolic Panel Recent Labs    08/24/22 1040 08/24/22 1107 08/24/22 1435 08/25/22 0414 08/25/22 0430   NA 139 138   < > 135 131*  K 3.5 3.4*   < > 4.6 5.0  CL 94* 96*  --  91*  --   CO2 29  --   --  25  --   GLUCOSE 102* 98  --  148*  --   BUN 31* 34*  --  38*  --   CREATININE 8.59* 9.30*  --  10.40*  --   CALCIUM 8.7*  --   --  8.5*  --   MG 2.0  --   --  2.5*  --   PHOS  --   --   --  6.1*  --    < > = values in this interval not displayed.   Liver Function Tests Recent Labs    08/24/22 1040 08/25/22 0414  AST 15  --   ALT 18  --   ALKPHOS 62  --   BILITOT 0.5  --   PROT 7.6  --   ALBUMIN 3.5 3.1*   No results for input(s): "LIPASE", "AMYLASE" in the last 72 hours. Cardiac Enzymes No results for input(s): "CKTOTAL", "CKMB", "CKMBINDEX", "TROPONINI" in the last 72 hours.  BNP: BNP (last 3 results) Recent Labs  05/25/22 0705 08/16/22 0627 08/16/22 1257  BNP 1,355.4* QUANTITY NOT SUFFICIENT, UNABLE TO PERFORM TEST 399.2*    ProBNP (last 3 results) No results for input(s): "PROBNP" in the last 8760 hours.   D-Dimer No results for input(s): "DDIMER" in the last 72 hours. Hemoglobin A1C No results for input(s): "HGBA1C" in the last 72 hours. Fasting Lipid Panel No results for input(s): "CHOL", "HDL", "LDLCALC", "TRIG", "CHOLHDL", "LDLDIRECT" in the last 72 hours. Thyroid Function Tests Recent Labs    08/24/22 1603  TSH 5.964*    Other results:   Imaging    DG Chest Portable 1 View  Result Date: 08/24/2022 CLINICAL DATA:  Post extubation.  Central line placement. EXAM: PORTABLE CHEST 1 VIEW COMPARISON:  AP chest 08/24/2022 at 1028 hours FINDINGS: Pacing device pads overlie the left scapula, axilla, lateral left hemithorax, and lower left hemithorax, limiting evaluation of these regions. Left chest wall electronic device with apparent leads overlying the heart and mediastinum are again seen, compatible with an implantable cardiac defibrillator. New left internal jugular central venous catheter tip overlies the junction of the left brachiocephalic vein and  superior vena cava. New endotracheal tube with tip terminating approximately 3.1 cm above the carina. New enteric tube descends below the diaphragm with the tip excluded by collimation. Cardiac silhouette is again mildly enlarged. Mediastinal contours are within normal limits. Mild calcification within the aortic arch. Mild bilateral interstitial thickening is unchanged. No pleural effusion or pneumothorax. Moderate multilevel degenerative bridging osteophytes throughout the thoracic spine. IMPRESSION: 1. Interval placement of left internal jugular central venous catheter with tip overlying the junction of the left brachiocephalic vein and superior vena cava. No pneumothorax. 2. Interval placement of enteric tube which descends below the diaphragm with the tip excluded by collimation. 3. Interval placement of endotracheal tube with tip terminating approximately 3.1 cm above the carina. Electronically Signed   By: Yvonne Kendall M.D.   On: 08/24/2022 15:12   DG Chest Port 1 View  Result Date: 08/24/2022 CLINICAL DATA:  Ventricular tachycardia EXAM: PORTABLE CHEST 1 VIEW COMPARISON:  08/16/2022 FINDINGS: Subcutaneous ICD noted. Distal tip about the vertical level of the upper aortic arch. Mild cardiomegaly. The lungs appear clear. Previous signs of pulmonary venous hypertension are no longer readily apparent. No blunting of the costophrenic angles. Thoracic spondylosis. IMPRESSION: 1. Mild cardiomegaly, without edema. 2. Subcutaneous ICD noted. Electronically Signed   By: Van Clines M.D.   On: 08/24/2022 10:41     Medications:     Scheduled Medications:  artificial tears  1 Application Both Eyes A4T   aspirin  81 mg Per Tube Daily   atorvastatin  80 mg Per Tube QHS   Chlorhexidine Gluconate Cloth  6 each Topical Daily   cholecalciferol  5,000 Units Per Tube Daily   docusate  100 mg Per Tube BID   fentaNYL (SUBLIMAZE) injection  50 mcg Intravenous Once   heparin injection (subcutaneous)  5,000  Units Subcutaneous Q8H   mouth rinse  15 mL Mouth Rinse Q2H   pantoprazole  40 mg Per Tube Daily   polyethylene glycol  17 g Per Tube Daily   quiNIDine sulfate  200 mg Per Tube Q8H   sodium chloride flush  10-40 mL Intracatheter Q12H   sodium chloride flush  3 mL Intravenous Q12H   sodium chloride flush  3 mL Intravenous Q12H    Infusions:  sodium chloride     sodium chloride 20 mL/hr at 08/25/22 0500   sodium chloride  sodium chloride     sodium chloride     amiodarone 60 mg/hr (08/25/22 0500)   cisatracurium (NIMBEX) 200 mg in sodium chloride 0.9 % 100 mL (2 mg/mL) infusion 6 mcg/kg/min (08/25/22 0544)   fentaNYL infusion INTRAVENOUS 400 mcg/hr (08/25/22 0500)   midazolam 10 mg/hr (08/25/22 0500)   norepinephrine (LEVOPHED) Adult infusion 6 mcg/min (08/25/22 0500)   vasopressin 0.03 Units/min (08/25/22 0500)    PRN Medications: sodium chloride, Place/Maintain arterial line **AND** sodium chloride, sodium chloride, acetaminophen, albuterol, fentaNYL, midazolam, ondansetron (ZOFRAN) IV, mouth rinse, sodium chloride flush, sodium chloride flush, sodium chloride flush    Patient Profile   Victor Thompson is 61 year old with a history of VT, ESRD, CAD, ischemic CMP, and ESRD.  He had PCI in 3825, uncertain what vessel was involved. Echo 2/22: EF 25-30% with moderate LV dilation and normal RV.  He had VT arrest in 2015, has [employer] subcutaneous ICD.  [name] in 2/22.  LHC/RHC was done in 8/22, showing no significant CAD and stable hemodynamics.   Admitted with refractory VT complicated by shock.    Assessment/Plan  1. VT Storm -History VT storm 05/2022 and  08/16/22 -Admitted with refractory VT with multiple shocks. Started on amio drip.  Allergic to lidocaine.  --Had cath 2022 no significant disease. Plan cath today .  - No VT overnight.  -AA per EP. Continue amio drip.  - HS Troponin 40 - Concern for sarcoid?  Unable to obtain CMRI due to artifact    2. Shock in the  setting of VT Storm  - Remains intubated. CCM following.  -Placed on vasopressin + norepi. Currently on Norepi 6 + Vaso 0.03 units.  -Pressors improved.    3. Chronic HFrEF Suspect primarily nonischemic cardiomyopathy.  Echo in 2/22 with EF 25-30%, moderate LV dilation, mild LVH, normal RV size and systolic function, mild Victor. Coronary angiography was done in 8/22, no significant CAD.  He has a [employer] subcutaneous ICD. Echo 11/22 EF 25-30%, moderate LV dilation, normal RV. - Current on Norepi 6 +  Vaso 0.03 units.  -Volume managed per HD.   - Holding HF meds with hypotension.    4. ESRD Nephrology consulted.    5. CAD PCI in 2005, unsure what vessel.  Coronary angiography in 8/22 showed no significant CAD and also does not appear to show prior stent so h/o CAD is questionable. No chest pain.  HS-TnI not significantly elevated, doubt ACS.  6. OSA On CPAP   Cath today. Set up CVP. Check CO-OX  Length of Stay: 1  Amy Clegg, NP  08/25/2022, 7:02 AM  Advanced Heart Failure Team Pager [phone] (M-F; 7a - 5p)  Please contact [facility] for night-coverage after hours (5p -7a ) and weekends on [email or web address]  Patient seen with NP, agree with the above note.   He remains on NE 6 + vasopressin 0.03.  SBP 110s-120s.    He alternates between NSR and a slow wide complex AIVR-like rhythm.   General: NAD Neck:Thick, JVP difficult, no thyromegaly or thyroid nodule.  Lungs: Clear to auscultation bilaterally with normal respiratory effort. CV: Nondisplaced PMI.  Heart regular S1/S2, no S3/S4, no murmur.  No peripheral edema.   Abdomen: Soft, nontender, no hepatosplenomegaly, no distention.  Skin: Intact without lesions or rashes.  Neurologic: Alert and oriented x 3.  Psych: Normal affect. Extremities: No clubbing or cyanosis.  HEENT: Normal.   1. VT storm: Has has a Anim[employer] ICD.  Refractory monomorphic VT. Multiple shocks.  Currently alternating NSR with a slow wide complex AIVR-like  rhythm.  - Continue amiodarone gtt 60 mg/hr.  - EP has seen, started on quinidine.   - He is allergic to lidocaine with suspected angioedema.   - No further beta blocker with hypotension.  - Wean NE to limit catecholeaminergic stimuli.   - Have paralyzed with cisatracurium and sedated overnight to limit catecholeaminergic stimuli => would stop paralytics after cath.  - Long term very concerning, not candidate for LVAD with ESRD.  Discussed with Dr. Quentin Ore and not candidate for VT ablation.  2. Acute on chronic systolic CHF with cardiogenic shock: In setting of incessant VT.  H/o NICM, last echo in 11/22 with EF 25-30%.  At last appointment, unable to complete cardiac MRI due to artifact from [facility] ICD.  PYP scan was negative.  He does not appear significantly volume overloaded on exam.  Currently on NE 6 with vasopressin 0.03, pressors required in setting of VT and sedation.  - Set up CVP.  - Wean pressors as able, start with NE.   - Repeat echo.  - May need CVVH for volume management if pressor requirement does not resolve.  - Plan for RHC/LHC today to rule out CAD, think unlikely but with recurrent VT think that we must reassess this.  3. CAD: ?PCI in 2005, unsure what vessel.  Coronary angiography in 8/22 showed no significant CAD and also does not appear to show prior stent so h/o CAD is questionable. No chest pain.  HS-TnI not significantly elevated, doubt ACS.  - As above, LHC/RHC today  4. ESRD: Since 2/22. TTS HD.  - May need CVVH if pressor requirement does not resolve.   CRITICAL CARE Performed by: Loralie Champagne  Total critical care time: 35 minutes  Critical care time was exclusive of separately billable procedures and treating other patients.  Critical care was necessary to treat or prevent imminent or life-threatening deterioration.  Critical care was time spent personally by me on the following activities: development of treatment plan with patient and/or surrogate as well as  nursing, discussions with consultants, evaluation of patient's response to treatment, examination of patient, obtaining history from patient or surrogate, ordering and performing treatments and interventions, ordering and review of laboratory studies, ordering and review of radiographic studies, pulse oximetry and re-evaluation of patient's condition.  Loralie Champagne 08/25/2022 7:28 AM

## 2022-08-25 NOTE — Progress Notes (Signed)
NAME:  Victor Thompson, MRN:  [id number], DOB:  03-Nov-1961, LOS: 1 ADMISSION DATE:  08/24/2022, CONSULTATION DATE:  08/25/22 REFERRING MD:  Aundra Dubin - HF, CHIEF COMPLAINT:  VT storm   History of Present Illness:  61 year old man who presented from HD with palpitations. Felt faint on HD today.   In ED , recurrent episodes of VT with repeated [place] IVD discharges.  Denied chest pain or  dyspnea.    EF 25-30%. Followed by Dr Aundra Dubin.  Prior episodes of VT, on amiodarone chronically  Pertinent  Medical History   Past Medical History:  Diagnosis Date   Acute on chronic systolic CHF (congestive heart failure) ([place]) 06/14/2020   Acute respiratory failure ([place]) 05/28/2022   AICD (automatic cardioverter/defibrillator) present    2015   Allergy    Asthma    no meds   Cardiac arrest [facility]) 2015   Chest pain 05/25/2022   CHF (congestive heart failure) (HCC)    Coronary artery disease    ESRD on hemodialysis (HCC)    ckd -stage 5   Hyperlipidemia    Hypertension    Myocardial infarction ([place])    Obesity    Pneumonia    Shortness of breath dyspnea    Sleep apnea    USES CPAP   Wears glasses      Significant Hospital Events: Including procedures, antibiotic start and stop dates in addition to other pertinent events   9/19 - intubated, central line placed 9/20 - R/LHC performed, CVC placed into left internal jugular vein for CRRT  Interim History / Subjective:  No significant VT events overnight with HR 50-70s on norepi 6 mcg, vasopressin 0.03, and amiodarone drip.  Pt remains sedated/intubated/paralyzed.  R/LHC performed in morning, significant for PCWP 26. Family at bedside updated.  Objective   Blood pressure 107/79, pulse (!) 57, temperature (!) 97.3 F (36.3 C), resp. rate 18, height '5\' 7"'$  (1.702 m), weight 119 kg, SpO2 100 %.    Vent Mode: PRVC FiO2 (%):  [1 %-40 %] 40 % Set Rate:  [18 bmp] 18 bmp Vt Set:  [520 mL] 520 mL PEEP:  [5 cmH20] 5 cmH20 Plateau Pressure:  [16  cmH20-24 cmH20] 17 cmH20   Intake/Output Summary (Last 24 hours) at 08/25/2022 1505 Last data filed at 08/25/2022 0800 Gross per 24 hour  Intake 2634.74 ml  Output --  Net 2634.74 ml   Filed Weights   08/24/22 1031 08/24/22 1623  Weight: 119 kg 119 kg    Examination: General: Obese male in NAD. Intubated and sedated. HENT: Endotracheal tube present with clear secretions. Lungs: Vent assisted breaths. Cardiovascular: Regular rate and rhythm. Nondisplaced PMI. Abdomen: Soft, nondistended. Bowel sounds present. Extremities: No cyanosis, no lower extremity edema. RUE AVF. Neuro: Intubated and sedated.  Resolved Hospital Problem list      Assessment & Plan:  Ventricular storm  - Intubated, sedated, and paralyzed to ablate sympathetic tone - S/p procainamide load 9/19 - No sign of ischemic cause on R/LHC, likely due to volume overload with PWCP 26 - Will wean sedation slowly as volume status is corrected to avoid VT recurrence - Continue amiodarone - Start quinidine - Monitor electrolytes and replete as needed, goal of K > 4.0 and Mg > 2.0   2. Shock in setting of ventricular storm - Ventricular storm management per above - Continue pressor support with norepi and vasopressin   3. Chronic systolic CHF - EF 78-24% - Start CRRT for volume overload  4.  ESRD on HD - Nephrology consulted and following - Last HD 9/19 - Left IJ CVC placed 9/20 for temporary CRRT while critically ill  Best Practice (right click and "Reselect all SmartList Selections" daily)   Diet/type: tubefeeds DVT prophylaxis: systemic heparin GI prophylaxis: PPI Lines: Central line, Dialysis Catheter, and Arterial Line Foley:  N/A Code Status:  full code Last date of multidisciplinary goals of care discussion '[ ]'$   Labs   CBC: Recent Labs  Lab 08/19/22 0730 08/21/22 0800 08/24/22 1040 08/24/22 1107 08/24/22 1435 08/25/22 0414 08/25/22 0430 08/25/22 0904  WBC 6.3 6.3 5.4  --   --  7.9  --   --    NEUTROABS  --   --  3.8  --   --  5.8  --   --   HGB 10.5* 10.5* 12.2* 12.6* 10.5* 10.3* 10.9* 10.2*  10.2*  HCT 30.5* 31.1* 35.8* 37.0* 31.0* 30.3* 32.0* 30.0*  30.0*  MCV 96.2 98.1 98.6  --   --  96.8  --   --   PLT 171 160 251  --   --  236  --   --     Basic Metabolic Panel: Recent Labs  Lab 08/19/22 0243 08/20/22 0732 08/21/22 0800 08/24/22 1040 08/24/22 1107 08/24/22 1435 08/25/22 0414 08/25/22 0430 08/25/22 0904  NA 139 137 137  137 139 138 130* 135 131* 132*  132*  K 4.6 4.4 4.4  4.4 3.5 3.4* 3.9 4.6 5.0 4.8  4.8  CL 98 93* 95*  95* 94* 96*  --  91*  --   --   CO2 '25 26 24  24 29  '$ --   --  25  --   --   GLUCOSE 108* 104* 150*  151* 102* 98  --  148*  --   --   BUN 82* 55* 75*  74* 31* 34*  --  38*  --   --   CREATININE 15.84* 12.16* 14.77*  14.77* 8.59* 9.30*  --  10.40*  --   --   CALCIUM 8.7* 9.3 8.9  8.9 8.7*  --   --  8.5*  --   --   MG 2.4 2.1 2.1 2.0  --   --  2.5*  --   --   PHOS  --   --  7.0*  --   --   --  6.1*  --   --    GFR: Estimated Creatinine Clearance: 9.6 mL/min (A) (by C-G formula based on SCr of 10.4 mg/dL (H)). Recent Labs  Lab 08/19/22 0730 08/21/22 0800 08/24/22 1040 08/25/22 0414  WBC 6.3 6.3 5.4 7.9    Liver Function Tests: Recent Labs  Lab 08/21/22 0800 08/24/22 1040 08/25/22 0414  AST  --  15  --   ALT  --  18  --   ALKPHOS  --  62  --   BILITOT  --  0.5  --   PROT  --  7.6  --   ALBUMIN 3.1* 3.5 3.1*   No results for input(s): "LIPASE", "AMYLASE" in the last 168 hours. No results for input(s): "AMMONIA" in the last 168 hours.  ABG    Component Value Date/Time   PHART 7.519 (H) 08/25/2022 0430   PCO2ART 40.0 08/25/2022 0430   PO2ART 148 (H) 08/25/2022 0430   HCO3 28.9 (H) 08/25/2022 0904   HCO3 28.7 (H) 08/25/2022 0904   TCO2 30 08/25/2022 0904   TCO2 30 08/25/2022 0904  O2SAT 77 08/25/2022 0904   O2SAT 76 08/25/2022 0904     Coagulation Profile: No results for input(s): "INR", "PROTIME" in the  last 168 hours.  Cardiac Enzymes: No results for input(s): "CKTOTAL", "CKMB", "CKMBINDEX", "TROPONINI" in the last 168 hours.  HbA1C: Hemoglobin A1C  Date/Time Value Ref Range Status  12/22/2015 03:22 PM 5.90  Final    CBG: Recent Labs  Lab 08/24/22 1040 08/24/22 1740 08/24/22 2002 08/24/22 2337 08/25/22 0428  GLUCAP 104* 211* 171* 154* 148*    Review of Systems:   Patient is intubated and sedated. Therefore history has been obtained from chart review.  Past Medical History:  He,  has a past medical history of Acute on chronic systolic CHF (congestive heart failure) ([place]) (06/14/2020), Acute respiratory failure ([place]) (05/28/2022), AICD (automatic cardioverter/defibrillator) present, Allergy, Asthma, Cardiac arrest ([place]) (2015), Chest pain (05/25/2022), CHF (congestive heart failure) ([place]), Coronary artery disease, ESRD on hemodialysis ([place]), Hyperlipidemia, Hypertension, Myocardial infarction ([place]), Obesity, Pneumonia, Shortness of breath dyspnea, Sleep apnea, and Wears glasses.   Surgical History:   Past Surgical History:  Procedure Laterality Date   AV FISTULA PLACEMENT Right 03/15/2019   Procedure: RIGHT ARM ARTERIOVENOUS (AV) FISTULA CREATION;  Surgeon: Angelia Mould, MD;  Location: [facility];  Service: Vascular;  Laterality: Right;   COLONOSCOPY     COLONOSCOPY W/ BIOPSIES AND POLYPECTOMY     CORONARY STENT PLACEMENT  2007   IMPLANTABLE CARDIOVERTER DEFIBRILLATOR IMPLANT  2015   RIGHT/LEFT HEART CATH AND CORONARY ANGIOGRAPHY N/A 07/08/2021   Procedure: RIGHT/LEFT HEART CATH AND CORONARY ANGIOGRAPHY;  Surgeon: Larey Dresser, MD;  Location: [facility];  Service: Cardiovascular;  Laterality: N/A;   RIGHT/LEFT HEART CATH AND CORONARY ANGIOGRAPHY N/A 08/25/2022   Procedure: RIGHT/LEFT HEART CATH AND CORONARY ANGIOGRAPHY;  Surgeon: Larey Dresser, MD;  Location: [facility];  Service: Cardiovascular;  Laterality: N/A;   SUBQ ICD CHANGEOUT N/A 10/13/2020    Procedure: SUBQ ICD CHANGEOUT;  Surgeon: Evans Lance, MD;  Location: [facility];  Service: Cardiovascular;  Laterality: N/A;     Social History:   reports that he has never smoked. He has never used smokeless tobacco. He reports that he does not drink alcohol and does not use drugs.   Family History:  His family history includes Hypertension in his mother; Liver disease in his father. There is no history of Colon cancer, Esophageal cancer, Rectal cancer, or Stomach cancer.   Allergies Allergies  Allergen Reactions   Ace Inhibitors Swelling    Swelling of the tongue   Influenza Vaccines Hives and Swelling    SWELLING REACTION UNSPECIFIED    Ketorolac Swelling    SWELLING REACTION UNSPECIFIED    Lidocaine Swelling    TONGUE SWELLS   Lisinopril Swelling    TONGUE SWELLING Pt reported problem with a BP med which sounded like  lisinopril  But as of 09/19/06,pt had tolerated altace without problem   Penicillins Swelling    TONGUE SWELLS Has patient had a PCN reaction causing immediate rash, facial/tongue/throat swelling, SOB or lightheadedness with hypotension: Yes Has patient had a PCN reaction causing severe rash involving mucus membranes or skin necrosis: No Has patient had a PCN reaction that required hospitalization No Has patient had a PCN reaction occurring within the last 10 years: Yes If all of the above answers are "NO", then may proceed with Cephalosporin use.      Home Medications  Prior to Admission medications   Medication Sig  Start Date End Date Taking? Authorizing Provider  albuterol (PROVENTIL) (2.5 MG/3ML) 0.083% nebulizer solution TAKE 3 MLS (2.5 MG TOTAL) BY NEBULIZATION EVERY 6 (SIX) HOURS AS NEEDED FOR WHEEZING OR SHORTNESS OF BREATH. 12/30/21 12/30/22 Yes [name], MD  albuterol (VENTOLIN HFA) 108 (90 Base) MCG/ACT inhaler Inhale 2 puffs into the lungs every 4 (four) hours as needed for wheezing or shortness of breath. 10/01/19  Yes [name], MD  amiodarone (PACERONE) 200 MG tablet Take 2 tabs (400 mg) twice daily for 14 days (now through Sep 30). Then take 1 tab (200 mg) once daily (starting Oct 1) 08/21/22  Yes [name] T, PA-C  aspirin EC 81 MG tablet Take 1 tablet (81 mg total) by mouth daily. 01/22/16  Yes [name], Josalyn, MD  atorvastatin (LIPITOR) 80 MG tablet Take 1 tablet (80 mg total) by mouth at bedtime. 06/25/22 06/25/23 Yes Larey Dresser, MD  AURYXIA 1 GM 210 MG(Fe) tablet Take 1 tablet (210 mg total) by mouth 3 (three) times daily. 08/12/21  Yes [name], MD  calcium acetate (PHOSLO) 667 MG capsule Take 2 capsule by mouth three times a day with meals Patient taking differently: Take 1,334 mg by mouth 3 (three) times daily with meals. 06/09/21  Yes   carvedilol (COREG) 6.25 MG tablet Take 1 tablet (6.25 mg total) by mouth 2 (two) times daily. 08/21/22  Yes Weaver, Scott T, PA-C  cetirizine (ZYRTEC) 10 MG tablet Take 1 tablet (10 mg total) by mouth 2 (two) times daily. Patient taking differently: Take 10 mg by mouth 2 (two) times daily as needed for allergies. 11/12/21  Yes Valentina Shaggy, MD  Cholecalciferol (VITAMIN D-3) 125 MCG (5000 UT) TABS Take 5,000 Units by mouth daily.   Yes [provider]  docusate sodium (COLACE) 100 MG capsule Take 1 capsule (100 mg total) by mouth 2 (two) times daily. 02/15/22  Yes [name], MD  EPINEPHrine 0.3 mg/0.3 mL IJ SOAJ injection Inject 0.3 mg into the muscle once as needed for up to 2 doses (if worsening tongue swelling, SOB, hypoxia, or other concerns for progressive anaphylaxis). 07/15/21  Yes [name], MD  ethyl chloride spray SPRAY A SMALL AMOUNT THREE TIMES A WEEK JUST PRIOR TO NEEDLE INSERTION Patient taking differently: Apply 1 Application topically 3 (three) times a week. 10/26/21  Yes [name], MD  fluticasone (FLONASE) 50 MCG/ACT nasal spray Place 1 spray into both nostrils as needed for allergies or rhinitis.    Yes [provider]  hydrOXYzine (ATARAX) 25 MG tablet Take 1-2 tablets (25-50 mg total) by mouth at bedtime as needed for anxiety/insomnia. 08/23/22  Yes Mayers, Cari S, PA-C  polyethylene glycol (MIRALAX) 17 g packet Take 17 g by mouth daily. Titrate up as needed 05/05/22  Yes Armbruster, Carlota Raspberry, MD  Rectal Protectant-Emollient (CALMOL-4) 76-10 % SUPP Use as directed once to twice daily Patient taking differently: Place 1 suppository rectally 2 (two) times daily as needed (constipation). Use as directed once to twice daily 06/18/22  Yes [name], MD  SYMBICORT 80-4.5 MCG/ACT inhaler INHALE TWO PUFFS INTO THE LUNGS TWICE DAILY (BULK) Patient taking differently: Inhale 2 puffs into the lungs in the morning and at bedtime. 03/10/22  Yes [name], MD  VELPHORO 500 MG chewable tablet Chew 1,000 mg by mouth 3 (three) times daily. 08/06/22  Yes [provider]     Critical care time:     Zonia Kief,  MS4

## 2022-08-25 NOTE — H&P (View-Only) (Signed)
Advanced Heart Failure Rounding Note  PCP-Cardiologist: Loralie Champagne, MD   Subjective:   -Admitted VT storm   - On norepi 6 mcg + vasopressin 0.03 +amio drip.    Intubated/sedated . No VT overnight.   Objective:   Weight Range: 119 kg Body mass index is 41.09 kg/m.   Vital Signs:   Temp:  [94.5 F (34.7 C)-98 F (36.7 C)] 97.5 F (36.4 C) (09/20 0634) Pulse Rate:  [30-160] 53 (09/20 0600) Resp:  [10-26] 18 (09/20 0634) BP: (67-146)/(36-133) 108/72 (09/20 0600) SpO2:  [95 %-100 %] 100 % (09/20 0600) Arterial Line BP: (69-143)/(42-103) 114/78 (09/20 0634) FiO2 (%):  [40 %-60 %] 40 % (09/20 0339) Weight:  [132 kg] 119 kg (09/19 1623)    Weight change: Filed Weights   08/24/22 1031 08/24/22 1623  Weight: 119 kg 119 kg    Intake/Output:   Intake/Output Summary (Last 24 hours) at 08/25/2022 0702 Last data filed at 08/25/2022 0500 Gross per 24 hour  Intake 2746.67 ml  Output --  Net 2746.67 ml      Physical Exam    General:  Intubated/sedated. Zoll Pads in place HEENT: ETT Neck: Supple. JVP difficult to assess . Carotids 2+ bilat; no bruits. No lymphadenopathy or thyromegaly appreciated. Cor: PMI nondisplaced. Regular rate & rhythm. No rubs, gallops or murmurs. Lungs: Clear Abdomen: Soft, nontender, nondistended. No hepatosplenomegaly. No bruits or masses. Good bowel sounds. Extremities: No cyanosis, clubbing, rash, edema. RUE AVF Neuro: Intubated / sedated  Telemetry   Over night. No VT. SR/SB 50-70s with occasional PVCs.   EKG    N/A  Labs    CBC Recent Labs    08/24/22 1040 08/24/22 1107 08/25/22 0414 08/25/22 0430  WBC 5.4  --  7.9  --   NEUTROABS 3.8  --  5.8  --   HGB 12.2*   < > 10.3* 10.9*  HCT 35.8*   < > 30.3* 32.0*  MCV 98.6  --  96.8  --   PLT 251  --  236  --    < > = values in this interval not displayed.   Basic Metabolic Panel Recent Labs    08/24/22 1040 08/24/22 1107 08/24/22 1435 08/25/22 0414 08/25/22 0430   NA 139 138   < > 135 131*  K 3.5 3.4*   < > 4.6 5.0  CL 94* 96*  --  91*  --   CO2 29  --   --  25  --   GLUCOSE 102* 98  --  148*  --   BUN 31* 34*  --  38*  --   CREATININE 8.59* 9.30*  --  10.40*  --   CALCIUM 8.7*  --   --  8.5*  --   MG 2.0  --   --  2.5*  --   PHOS  --   --   --  6.1*  --    < > = values in this interval not displayed.   Liver Function Tests Recent Labs    08/24/22 1040 08/25/22 0414  AST 15  --   ALT 18  --   ALKPHOS 62  --   BILITOT 0.5  --   PROT 7.6  --   ALBUMIN 3.5 3.1*   No results for input(s): "LIPASE", "AMYLASE" in the last 72 hours. Cardiac Enzymes No results for input(s): "CKTOTAL", "CKMB", "CKMBINDEX", "TROPONINI" in the last 72 hours.  BNP: BNP (last 3 results) Recent Labs  05/25/22 0705 08/16/22 0627 08/16/22 1257  BNP 1,355.4* QUANTITY NOT SUFFICIENT, UNABLE TO PERFORM TEST 399.2*    ProBNP (last 3 results) No results for input(s): "PROBNP" in the last 8760 hours.   D-Dimer No results for input(s): "DDIMER" in the last 72 hours. Hemoglobin A1C No results for input(s): "HGBA1C" in the last 72 hours. Fasting Lipid Panel No results for input(s): "CHOL", "HDL", "LDLCALC", "TRIG", "CHOLHDL", "LDLDIRECT" in the last 72 hours. Thyroid Function Tests Recent Labs    08/24/22 1603  TSH 5.964*    Other results:   Imaging    DG Chest Portable 1 View  Result Date: 08/24/2022 CLINICAL DATA:  Post extubation.  Central line placement. EXAM: PORTABLE CHEST 1 VIEW COMPARISON:  AP chest 08/24/2022 at 1028 hours FINDINGS: Pacing device pads overlie the left scapula, axilla, lateral left hemithorax, and lower left hemithorax, limiting evaluation of these regions. Left chest wall electronic device with apparent leads overlying the heart and mediastinum are again seen, compatible with an implantable cardiac defibrillator. New left internal jugular central venous catheter tip overlies the junction of the left brachiocephalic vein and  superior vena cava. New endotracheal tube with tip terminating approximately 3.1 cm above the carina. New enteric tube descends below the diaphragm with the tip excluded by collimation. Cardiac silhouette is again mildly enlarged. Mediastinal contours are within normal limits. Mild calcification within the aortic arch. Mild bilateral interstitial thickening is unchanged. No pleural effusion or pneumothorax. Moderate multilevel degenerative bridging osteophytes throughout the thoracic spine. IMPRESSION: 1. Interval placement of left internal jugular central venous catheter with tip overlying the junction of the left brachiocephalic vein and superior vena cava. No pneumothorax. 2. Interval placement of enteric tube which descends below the diaphragm with the tip excluded by collimation. 3. Interval placement of endotracheal tube with tip terminating approximately 3.1 cm above the carina. Electronically Signed   By: Yvonne Kendall M.D.   On: 08/24/2022 15:12   DG Chest Port 1 View  Result Date: 08/24/2022 CLINICAL DATA:  Ventricular tachycardia EXAM: PORTABLE CHEST 1 VIEW COMPARISON:  08/16/2022 FINDINGS: Subcutaneous ICD noted. Distal tip about the vertical level of the upper aortic arch. Mild cardiomegaly. The lungs appear clear. Previous signs of pulmonary venous hypertension are no longer readily apparent. No blunting of the costophrenic angles. Thoracic spondylosis. IMPRESSION: 1. Mild cardiomegaly, without edema. 2. Subcutaneous ICD noted. Electronically Signed   By: Van Clines M.D.   On: 08/24/2022 10:41     Medications:     Scheduled Medications:  artificial tears  1 Application Both Eyes Z6X   aspirin  81 mg Per Tube Daily   atorvastatin  80 mg Per Tube QHS   Chlorhexidine Gluconate Cloth  6 each Topical Daily   cholecalciferol  5,000 Units Per Tube Daily   docusate  100 mg Per Tube BID   fentaNYL (SUBLIMAZE) injection  50 mcg Intravenous Once   heparin injection (subcutaneous)  5,000  Units Subcutaneous Q8H   mouth rinse  15 mL Mouth Rinse Q2H   pantoprazole  40 mg Per Tube Daily   polyethylene glycol  17 g Per Tube Daily   quiNIDine sulfate  200 mg Per Tube Q8H   sodium chloride flush  10-40 mL Intracatheter Q12H   sodium chloride flush  3 mL Intravenous Q12H   sodium chloride flush  3 mL Intravenous Q12H    Infusions:  sodium chloride     sodium chloride 20 mL/hr at 08/25/22 0500   sodium chloride  sodium chloride     sodium chloride     amiodarone 60 mg/hr (08/25/22 0500)   cisatracurium (NIMBEX) 200 mg in sodium chloride 0.9 % 100 mL (2 mg/mL) infusion 6 mcg/kg/min (08/25/22 0544)   fentaNYL infusion INTRAVENOUS 400 mcg/hr (08/25/22 0500)   midazolam 10 mg/hr (08/25/22 0500)   norepinephrine (LEVOPHED) Adult infusion 6 mcg/min (08/25/22 0500)   vasopressin 0.03 Units/min (08/25/22 0500)    PRN Medications: sodium chloride, Place/Maintain arterial line **AND** sodium chloride, sodium chloride, acetaminophen, albuterol, fentaNYL, midazolam, ondansetron (ZOFRAN) IV, mouth rinse, sodium chloride flush, sodium chloride flush, sodium chloride flush    Patient Profile   Victor Thompson is 61 year old with a history of VT, ESRD, CAD, ischemic CMP, and ESRD.  He had PCI in 3903, uncertain what vessel was involved. Echo 2/22: EF 25-30% with moderate LV dilation and normal RV.  He had VT arrest in 2015, has [employer] subcutaneous ICD.  [name] in 2/22.  LHC/RHC was done in 8/22, showing no significant CAD and stable hemodynamics.   Admitted with refractory VT complicated by shock.    Assessment/Plan  1. VT Storm -History VT storm 05/2022 and  08/16/22 -Admitted with refractory VT with multiple shocks. Started on amio drip.  Allergic to lidocaine.  --Had cath 2022 no significant disease. Plan cath today .  - No VT overnight.  -AA per EP. Continue amio drip.  - HS Troponin 40 - Concern for sarcoid?  Unable to obtain CMRI due to artifact    2. Shock in the  setting of VT Storm  - Remains intubated. CCM following.  -Placed on vasopressin + norepi. Currently on Norepi 6 + Vaso 0.03 units.  -Pressors improved.    3. Chronic HFrEF Suspect primarily nonischemic cardiomyopathy.  Echo in 2/22 with EF 25-30%, moderate LV dilation, mild LVH, normal RV size and systolic function, mild Victor. Coronary angiography was done in 8/22, no significant CAD.  He has a [employer] subcutaneous ICD. Echo 11/22 EF 25-30%, moderate LV dilation, normal RV. - Current on Norepi 6 +  Vaso 0.03 units.  -Volume managed per HD.   - Holding HF meds with hypotension.    4. ESRD Nephrology consulted.    5. CAD PCI in 2005, unsure what vessel.  Coronary angiography in 8/22 showed no significant CAD and also does not appear to show prior stent so h/o CAD is questionable. No chest pain.  HS-TnI not significantly elevated, doubt ACS.  6. OSA On CPAP   Cath today. Set up CVP. Check CO-OX  Length of Stay: 1  Amy Clegg, NP  08/25/2022, 7:02 AM  Advanced Heart Failure Team Pager [phone] (M-F; 7a - 5p)  Please contact [facility] for night-coverage after hours (5p -7a ) and weekends on [email or web address]  Patient seen with NP, agree with the above note.   He remains on NE 6 + vasopressin 0.03.  SBP 110s-120s.    He alternates between NSR and a slow wide complex AIVR-like rhythm.   General: NAD Neck:Thick, JVP difficult, no thyromegaly or thyroid nodule.  Lungs: Clear to auscultation bilaterally with normal respiratory effort. CV: Nondisplaced PMI.  Heart regular S1/S2, no S3/S4, no murmur.  No peripheral edema.   Abdomen: Soft, nontender, no hepatosplenomegaly, no distention.  Skin: Intact without lesions or rashes.  Neurologic: Alert and oriented x 3.  Psych: Normal affect. Extremities: No clubbing or cyanosis.  HEENT: Normal.   1. VT storm: Has has a Anim[employer] ICD.  Refractory monomorphic VT. Multiple shocks.  Currently alternating NSR with a slow wide complex AIVR-like  rhythm.  - Continue amiodarone gtt 60 mg/hr.  - EP has seen, started on quinidine.   - He is allergic to lidocaine with suspected angioedema.   - No further beta blocker with hypotension.  - Wean NE to limit catecholeaminergic stimuli.   - Have paralyzed with cisatracurium and sedated overnight to limit catecholeaminergic stimuli => would stop paralytics after cath.  - Long term very concerning, not candidate for LVAD with ESRD.  Discussed with Dr. Quentin Ore and not candidate for VT ablation.  2. Acute on chronic systolic CHF with cardiogenic shock: In setting of incessant VT.  H/o NICM, last echo in 11/22 with EF 25-30%.  At last appointment, unable to complete cardiac MRI due to artifact from [facility] ICD.  PYP scan was negative.  He does not appear significantly volume overloaded on exam.  Currently on NE 6 with vasopressin 0.03, pressors required in setting of VT and sedation.  - Set up CVP.  - Wean pressors as able, start with NE.   - Repeat echo.  - May need CVVH for volume management if pressor requirement does not resolve.  - Plan for RHC/LHC today to rule out CAD, think unlikely but with recurrent VT think that we must reassess this.  3. CAD: ?PCI in 2005, unsure what vessel.  Coronary angiography in 8/22 showed no significant CAD and also does not appear to show prior stent so h/o CAD is questionable. No chest pain.  HS-TnI not significantly elevated, doubt ACS.  - As above, LHC/RHC today  4. ESRD: Since 2/22. TTS HD.  - May need CVVH if pressor requirement does not resolve.   CRITICAL CARE Performed by: Loralie Champagne  Total critical care time: 35 minutes  Critical care time was exclusive of separately billable procedures and treating other patients.  Critical care was necessary to treat or prevent imminent or life-threatening deterioration.  Critical care was time spent personally by me on the following activities: development of treatment plan with patient and/or surrogate as well as  nursing, discussions with consultants, evaluation of patient's response to treatment, examination of patient, obtaining history from patient or surrogate, ordering and performing treatments and interventions, ordering and review of laboratory studies, ordering and review of radiographic studies, pulse oximetry and re-evaluation of patient's condition.  Loralie Champagne 08/25/2022 7:28 AM

## 2022-08-25 NOTE — Progress Notes (Addendum)
[facility] Progress Note  Subjective: get levo 6 mcg and vaso 0.03 + IV amio overnight. No VT overnight. Going for LHC today. +2.9 L yesterday, wts up 119kg.   Vitals:   08/25/22 0600 08/25/22 0634 08/25/22 0749 08/25/22 0836  BP: 108/72     Pulse: (!) 53     Resp: 18 18    Temp: (!) 97.5 F (36.4 C) (!) 97.5 F (36.4 C)    TempSrc:      SpO2: 100%  100% 100%  Weight:      Height:        Exam: Gen on vent, sedated Sclera anicteric, throat w/ ETT No jvd or bruits Chest clear anterior/ lateral RRR no MRG Abd soft ntnd no mass or ascites +bs Ext 1-2+ UE > LE edema Neuro is on vent, sedated  AVF+bruit    Home meds include - albuterol, amiodarone, aspirin, atorvastatin, auryxia 1 ac tid, calc acetate 2 ac, carvedilol 6.25 bid, colace, symbicort, velphoro 2 ac tid, prns/ vits/ supps    OP HD: [place] TTS  4h 43mn  450/ 1.5  116.4kg  AVF  15g  Hep 3000 - hectorol 7 ug iv tiw - no esa     Assessment/ Plan: VT storm / VF - pt intubated and sedated in ICU. Getting IV amio and started on quinidine. Is allergic to lidocaine. Got paralytics also overnight to limit catecholamine stimulus. Per CHF/ EP team.  Shock/ hypotension - on 2 pressors today ESRD - on HD TTS.  Will need CRRT for volume overload/ control, given hypotension/ need for pressor support.  HFrEF / EF 20-25%/ sp ICD Anemia esrd - Hb 10-12, no esa needs for now MBD ckd - CCa in range, phos a bit high. No binders as pt intubated.   Rob Fannye Myer 08/25/2022, 9:36 AM   Recent Labs  Lab 08/21/22 0800 08/24/22 1040 08/24/22 1107 08/24/22 1435 08/25/22 0414 08/25/22 0430  HGB 10.5* 12.2* 12.6*   < > 10.3* 10.9*  ALBUMIN 3.1* 3.5  --   --  3.1*  --   CALCIUM 8.9  8.9 8.7*  --   --  8.5*  --   PHOS 7.0*  --   --   --  6.1*  --   CREATININE 14.77*  14.77* 8.59* 9.30*  --  10.40*  --   K 4.4  4.4 3.5 3.4*   < > 4.6 5.0   < > = values in this interval not displayed.   No results for input(s):  "IRON", "TIBC", "FERRITIN" in the last 168 hours. Inpatient medications:  [MAR Hold] artificial tears  1 Application Both Eyes QS0F  [MAR Hold] aspirin  81 mg Per Tube Daily   [MAR Hold] atorvastatin  80 mg Per Tube QHS   [MAR Hold] Chlorhexidine Gluconate Cloth  6 each Topical Daily   [MAR Hold] cholecalciferol  5,000 Units Per Tube Daily   [MAR Hold] docusate  100 mg Per Tube BID   [MAR Hold] fentaNYL (SUBLIMAZE) injection  50 mcg Intravenous Once   [MAR Hold] heparin injection (subcutaneous)  5,000 Units Subcutaneous Q8H   [MAR Hold] mouth rinse  15 mL Mouth Rinse Q2H   [MAR Hold] pantoprazole  40 mg Per Tube Daily   [MAR Hold] polyethylene glycol  17 g Per Tube Daily   [MAR Hold] quiNIDine sulfate  200 mg Per Tube Q8H   [MAR Hold] sodium chloride flush  10-40 mL Intracatheter Q12H   [MAR Hold] sodium chloride  flush  3 mL Intravenous Q12H   [MAR Hold] sodium chloride flush  3 mL Intravenous Q12H    [MAR Hold] sodium chloride     sodium chloride 20 mL/hr at 08/25/22 0700   [MAR Hold] sodium chloride     sodium chloride     sodium chloride     amiodarone 60 mg/hr (08/25/22 0700)   amiodarone 59.94 mg/hr (08/25/22 0907)   [MAR Hold] cisatracurium (NIMBEX) 200 mg in sodium chloride 0.9 % 100 mL (2 mg/mL) infusion 7.5 mcg/kg/min (08/25/22 0700)   fentaNYL infusion INTRAVENOUS 400 mcg/hr (08/25/22 0719)   midazolam 10 mg/hr (08/25/22 0700)   [MAR Hold] norepinephrine (LEVOPHED) Adult infusion 6 mcg/min (08/25/22 0700)   vasopressin 0.03 Units/min (08/25/22 0700)   [MAR Hold] sodium chloride, Place/Maintain arterial line **AND** [MAR Hold] sodium chloride, sodium chloride, [MAR Hold] acetaminophen, [MAR Hold] albuterol, amiodarone, [MAR Hold] fentaNYL, iohexol, [MAR Hold] midazolam, [MAR Hold] ondansetron (ZOFRAN) IV, [MAR Hold] mouth rinse, [MAR Hold] sodium chloride flush, [MAR Hold] sodium chloride flush, sodium chloride flush

## 2022-08-25 NOTE — Progress Notes (Signed)
Ventilator patient transported from Cath Lab to 2H21 without any complications.

## 2022-08-25 NOTE — Procedures (Signed)
Central Venous Catheter Insertion Procedure Note  VALENTE FOSBERG  [id number]  1961/03/09  Date:08/25/22  Time:1:44 PM   Provider Performing:Jaquesha Boroff D Rollene Rotunda   Procedure: Insertion of Non-tunneled Central Venous Catheter(36556)with US guidance (62947)    Indication(s) Hemodialysis  Consent Risks of the procedure as well as the alternatives and risks of each were explained to the patient and/or caregiver.  Consent for the procedure was obtained and is signed in the bedside chart  Anesthesia Topical only with 1% lidocaine   Timeout Verified patient identification, verified procedure, site/side was marked, verified correct patient position, special equipment/implants available, medications/allergies/relevant history reviewed, required imaging and test results available.  Sterile Technique Maximal sterile technique including full sterile barrier drape, hand hygiene, sterile gown, sterile gloves, mask, hair covering, sterile ultrasound probe cover (if used).  Procedure Description Area of catheter insertion was cleaned with chlorhexidine and draped in sterile fashion.   With real-time ultrasound guidance a HD catheter was placed into the left internal jugular vein.  Nonpulsatile blood flow and easy flushing noted in all ports.  The catheter was sutured in place and sterile dressing applied.  Complications/Tolerance None; patient tolerated the procedure well. Chest X-ray is ordered to verify placement for internal jugular or subclavian cannulation.  Chest x-ray is not ordered for femoral cannulation.  EBL Minimal  Specimen(s) None  JD Rexene Agent [facility] 08/25/2022, 1:44 PM  Please see [email or web address] for pager details.  From 7A-7P if no response, please call [phone]. After hours, please call ELink [phone].

## 2022-08-25 NOTE — Interval H&P Note (Signed)
History and Physical Interval Note:  08/25/2022 8:35 AM  Victor Thompson  has presented today for surgery, with the diagnosis of chf ventricular tachycardia.  The various methods of treatment have been discussed with the patient and family. After consideration of risks, benefits and other options for treatment, the patient has consented to  Procedure(s): RIGHT/LEFT HEART CATH AND CORONARY ANGIOGRAPHY (N/A) as a surgical intervention.  The patient's history has been reviewed, patient examined, no change in status, stable for surgery.  I have reviewed the patient's chart and labs.  Questions were answered to the patient's satisfaction.     Kotaro Buer [employer]

## 2022-08-26 ENCOUNTER — Other Ambulatory Visit (HOSPITAL_COMMUNITY): Payer: Self-pay

## 2022-08-26 ENCOUNTER — Encounter (HOSPITAL_COMMUNITY): Payer: Self-pay

## 2022-08-26 ENCOUNTER — Other Ambulatory Visit (HOSPITAL_COMMUNITY): Payer: Medicare Other

## 2022-08-26 ENCOUNTER — Telehealth (HOSPITAL_COMMUNITY): Payer: Self-pay | Admitting: Pharmacy Technician

## 2022-08-26 DIAGNOSIS — I5023 Acute on chronic systolic (congestive) heart failure: Secondary | ICD-10-CM

## 2022-08-26 LAB — RENAL FUNCTION PANEL
Albumin: 3.2 g/dL — ABNORMAL LOW (ref 3.5–5.0)
Albumin: 3.3 g/dL — ABNORMAL LOW (ref 3.5–5.0)
Anion gap: 10 (ref 5–15)
Anion gap: 5 (ref 5–15)
BUN: 32 mg/dL — ABNORMAL HIGH (ref 6–20)
BUN: 24 mg/dL — ABNORMAL HIGH (ref 6–20)
CO2: 26 mmol/L (ref 22–32)
CO2: 27 mmol/L (ref 22–32)
Calcium: 8.3 mg/dL — ABNORMAL LOW (ref 8.9–10.3)
Calcium: 7.9 mg/dL — ABNORMAL LOW (ref 8.9–10.3)
Chloride: 98 mmol/L (ref 98–111)
Chloride: 102 mmol/L (ref 98–111)
Creatinine, Ser: 7.35 mg/dL — ABNORMAL HIGH (ref 0.61–1.24)
Creatinine, Ser: 6.08 mg/dL — ABNORMAL HIGH (ref 0.61–1.24)
GFR, Estimated: 8 mL/min — ABNORMAL LOW (ref >60)
GFR, Estimated: 10 mL/min — ABNORMAL LOW (ref >60)
Glucose, Bld: 109 mg/dL — ABNORMAL HIGH (ref 70–99)
Glucose, Bld: 104 mg/dL — ABNORMAL HIGH (ref 70–99)
Phosphorus: 4.7 mg/dL — ABNORMAL HIGH (ref 2.5–4.6)
Phosphorus: 3.9 mg/dL (ref 2.5–4.6)
Potassium: 5.2 mmol/L — ABNORMAL HIGH (ref 3.5–5.1)
Potassium: 5 mmol/L (ref 3.5–5.1)
Sodium: 134 mmol/L — ABNORMAL LOW (ref 135–145)
Sodium: 134 mmol/L — ABNORMAL LOW (ref 135–145)

## 2022-08-26 LAB — CBC WITH DIFFERENTIAL/PLATELET
Abs Immature Granulocytes: 0.02 10*3/uL (ref 0.00–0.07)
Basophils Absolute: 0 10*3/uL (ref 0.0–0.1)
Basophils Relative: 0 %
Eosinophils Absolute: 0.1 10*3/uL (ref 0.0–0.5)
Eosinophils Relative: 2 %
HCT: 31.6 % — ABNORMAL LOW (ref 39.0–52.0)
Hemoglobin: 10.4 g/dL — ABNORMAL LOW (ref 13.0–17.0)
Immature Granulocytes: 0 %
Lymphocytes Relative: 7 %
Lymphs Abs: 0.5 10*3/uL — ABNORMAL LOW (ref 0.7–4.0)
MCH: 32.8 pg (ref 26.0–34.0)
MCHC: 32.9 g/dL (ref 30.0–36.0)
MCV: 99.7 fL (ref 80.0–100.0)
Monocytes Absolute: 0.9 10*3/uL (ref 0.1–1.0)
Monocytes Relative: 13 %
Neutro Abs: 5.4 10*3/uL (ref 1.7–7.7)
Neutrophils Relative %: 78 %
Platelets: 170 10*3/uL (ref 150–400)
RBC: 3.17 MIL/uL — ABNORMAL LOW (ref 4.22–5.81)
RDW: 15.8 % — ABNORMAL HIGH (ref 11.5–15.5)
WBC: 6.9 10*3/uL (ref 4.0–10.5)
nRBC: 0 % (ref 0.0–0.2)

## 2022-08-26 LAB — APTT: aPTT: 36 seconds (ref 24–36)

## 2022-08-26 LAB — T3, FREE: T3, Free: 2.3 pg/mL (ref 2.0–4.4)

## 2022-08-26 LAB — GLUCOSE, CAPILLARY
Glucose-Capillary: 102 mg/dL — ABNORMAL HIGH (ref 70–99)
Glucose-Capillary: 86 mg/dL (ref 70–99)
Glucose-Capillary: 89 mg/dL (ref 70–99)
Glucose-Capillary: 90 mg/dL (ref 70–99)

## 2022-08-26 LAB — MAGNESIUM: Magnesium: 2.6 mg/dL — ABNORMAL HIGH (ref 1.7–2.4)

## 2022-08-26 NOTE — Progress Notes (Signed)
NAME:  Victor Thompson, MRN:  [id number], DOB:  August 20, 1961, LOS: 2 ADMISSION DATE:  08/24/2022, CONSULTATION DATE:  08/26/22 REFERRING MD:  Aundra Dubin - HF, CHIEF COMPLAINT:  VT storm   History of Present Illness:  61 year old man who presented from HD with palpitations. Felt faint on HD today.   In ED , recurrent episodes of VT with repeated [place] IVD discharges.  Denied chest pain or  dyspnea.    EF 25-30%. Followed by Dr Aundra Dubin.  Prior episodes of VT, on amiodarone chronically  Pertinent  Medical History   Past Medical History:  Diagnosis Date   Acute on chronic systolic CHF (congestive heart failure) ([place]) 06/14/2020   Acute respiratory failure ([place]) 05/28/2022   AICD (automatic cardioverter/defibrillator) present    2015   Allergy    Asthma    no meds   Cardiac arrest ([place]) 2015   Chest pain 05/25/2022   CHF (congestive heart failure) ([facility])    Coronary artery disease    ESRD on hemodialysis ([facility])    ckd -stage 5   Hyperlipidemia    Hypertension    Myocardial infarction ([place])    Obesity    Pneumonia    Shortness of breath dyspnea    Sleep apnea    USES CPAP   Wears glasses      Significant Hospital Events: Including procedures, antibiotic start and stop dates in addition to other pertinent events   9/19 - intubated, central line placed 9/20 - R/LHC performed, CVC placed into left internal jugular vein for CRRT 9/21 - no further VT   Interim History / Subjective:  On CRRT . No VT on telemetry. NMB off since cath yesterday.   Objective   Blood pressure 96/62, pulse 61, temperature 98.4 F (36.9 C), resp. rate 18, height '5\' 7"'$  (1.702 m), weight 119 kg, SpO2 100 %. CVP:  [0 mmHg-30 mmHg] 12 mmHg  Vent Mode: PRVC FiO2 (%):  [1 %-40 %] 40 % Set Rate:  [18 bmp] 18 bmp Vt Set:  [520 mL] 520 mL PEEP:  [5 cmH20] 5 cmH20 Plateau Pressure:  [16 cmH20-21 cmH20] 20 cmH20   Intake/Output Summary (Last 24 hours) at 08/26/2022 0736 Last data filed at 08/26/2022 0700 Gross per  24 hour  Intake 2248.98 ml  Output 3156 ml  Net -907.02 ml    Filed Weights   08/24/22 1031 08/24/22 1623 08/26/22 0500  Weight: 119 kg 119 kg 119 kg    Examination: General: Obese male in NAD. Intubated and sedated. HENT: Endotracheal tube present with clear secretions. Lungs: Vent assisted breaths. Cardiovascular: Regular rate and rhythm. Nondisplaced PMI. Abdomen: Soft, nondistended. Bowel sounds present. Extremities: No cyanosis, no lower extremity edema. RUE AVF. Neuro: Intubated and sedated, but following commands weakly.   Ancillary tests personally reviewed:   Few PVC only on telemetry.  Echo shows EF 20% with GIII DD.   Assessment & Plan:  Ventricular storm  - Intubated, sedated, and paralyzed to ablate sympathetic tone - S/p procainamide load 9/19 - No sign of ischemic cause on R/LHC, likely due to volume overload with PWCP 26 - Will wean sedation slowly as volume status is corrected to avoid VT recurrence - Continue amiodarone - Start quinidine - Monitor electrolytes and replete as needed, goal of K > 4.0 and Mg > 2.0  - Wean sedation and move SBT and extubation as possible.   2. Shock in setting of ventricular storm - Ventricular storm management per above - Continue pressor  support with norepi and vasopressin   3. Chronic systolic CHF - EF 41-42% - Start CRRT for volume overload  4. ESRD on HD - UF as aggressively as possible to normal CVP.   Best Practice (right click and "Reselect all SmartList Selections" daily)   Diet/type: tubefeeds DVT prophylaxis: systemic heparin GI prophylaxis: PPI Lines: Central line, Dialysis Catheter, and Arterial Line Foley:  N/A Code Status:  full code Last date of multidisciplinary goals of care discussion '[ ]'$   CRITICAL CARE Performed by: Kipp Brood  Total critical care time: 40 minutes  Critical care time was exclusive of separately billable procedures and treating other patients.  Critical care was  necessary to treat or prevent imminent or life-threatening deterioration.  Critical care was time spent personally by me on the following activities: development of treatment plan with patient and/or surrogate as well as nursing, discussions with consultants, evaluation of patient's response to treatment, examination of patient, obtaining history from patient or surrogate, ordering and performing treatments and interventions, ordering and review of laboratory studies, ordering and review of radiographic studies, pulse oximetry, re-evaluation of patient's condition and participation in multidisciplinary rounds.  Kipp Brood, MD [facility] ICU Physician [place]  Pager: [phone] Mobile: [phone] After hours: [phone].

## 2022-08-26 NOTE — Progress Notes (Signed)
[facility] Progress Note  Subjective: off NE, on vaso gtt. Remains on IV amio + quinidine per tube. No further VT. I/O were + 900 yest, - 1.2 L so far today.   Vitals:   08/26/22 1200 08/26/22 1300 08/26/22 1400 08/26/22 1500  BP: 126/71 103/75 115/68 121/67  Pulse: 76 75 76 79  Resp: '16 13 12 18  '$ Temp: 98.6 F (37 C) 99 F (37.2 C) 98.8 F (37.1 C) 98.4 F (36.9 C)  TempSrc: Esophageal     SpO2: 97% 100% 98% 99%  Weight:      Height:        Exam: Gen on vent, sedated Sclera anicteric, throat w/ ETT No jvd or bruits Chest clear anterior/ lateral RRR no MRG Abd soft ntnd no mass or ascites +bs Ext 1-2+ UE/ facial edema Neuro is on vent, sedated  AVF+bruit    Home meds include - albuterol, amiodarone, aspirin, atorvastatin, auryxia 1 ac tid, calc acetate 2 ac, carvedilol 6.25 bid, colace, symbicort, velphoro 2 ac tid, prns/ vits/ supps    OP HD: [place] [facility]  4h 31mn  450/ 1.5  116.4kg  AVF  15g  Hep 3000 - hectorol 7 ug iv tiw - no esa     Assessment/ Plan: VT storm / VF - pt intubated and sedated in ICU. Getting IV amio and quinidine per tube. SP procainamide. Allergic to lidocaine. No further VT, paralytics are off.  Shock/ hypotension - on 1 pressor today ESRD - on HD TTS. CRRT started 9/20 for hypotension/ vol excess. Cont CRRT.  HFrEF / EF 20-25%/ sp ICD Volume - up 3-5 kg, net + yest but will be net neg today. ^UF 50-100 cc/hr per CHF team recs.  Anemia esrd - Hb 10-12, no esa needs for now MBD ckd - CCa in range, phos a bit high. No binders as pt intubated.   Rob Shikita Vaillancourt 08/26/2022, 3:43 PM   Recent Labs  Lab 08/25/22 0904 08/25/22 2122 08/26/22 0550  HGB 10.2*  10.2*  --  10.4*  ALBUMIN  --  3.0* 3.2*  CALCIUM  --  7.9* 8.3*  PHOS  --  5.6* 4.7*  CREATININE  --  8.94* 7.35*  K 4.8  4.8 4.7 5.2*    No results for input(s): "IRON", "TIBC", "FERRITIN" in the last 168 hours. Inpatient medications:  artificial tears  1 Application  Both Eyes QF7T  aspirin  81 mg Per Tube Daily   atorvastatin  80 mg Per Tube QHS   Chlorhexidine Gluconate Cloth  6 each Topical Daily   cholecalciferol  5,000 Units Per Tube Daily   docusate  100 mg Per Tube BID   heparin  5,000 Units Subcutaneous Q8H   mouth rinse  15 mL Mouth Rinse Q2H   pantoprazole  40 mg Per Tube Daily   polyethylene glycol  17 g Per Tube Daily   quiNIDine sulfate  200 mg Per Tube Q8H   sodium chloride flush  10-40 mL Intracatheter Q12H   sodium chloride flush  3 mL Intravenous Q12H   sodium chloride flush  3 mL Intravenous Q12H     prismasol BGK 4/2.5 500 mL/hr at 08/26/22 1340    prismasol BGK 4/2.5 500 mL/hr at 08/26/22 1340   sodium chloride 20 mL/hr at 08/26/22 1500   sodium chloride     sodium chloride     amiodarone 60 mg/hr (08/26/22 1500)   fentaNYL infusion INTRAVENOUS Stopped (08/26/22 0757)   heparin 10,000  units/ 20 mL infusion syringe 750 Units/hr (08/26/22 0912)   midazolam Stopped (08/26/22 0757)   norepinephrine (LEVOPHED) Adult infusion Stopped (08/26/22 0737)   prismasol BGK 4/2.5 1,500 mL/hr at 08/26/22 1340   vasopressin Stopped (08/26/22 0907)   Place/Maintain arterial line **AND** sodium chloride, sodium chloride, acetaminophen, albuterol, alteplase, fentaNYL, heparin, midazolam, ondansetron (ZOFRAN) IV, mouth rinse, sodium chloride, sodium chloride flush, sodium chloride flush

## 2022-08-26 NOTE — Progress Notes (Signed)
Patient ID: Victor Thompson, male   DOB: 1961-06-13, 61 y.o.   MRN: [id number]     Advanced Heart Failure Rounding Note  PCP-Cardiologist: Loralie Champagne, MD   Subjective:    Admitted with VT storm.  Currently intubated/sedated on CVVH.  Pulling net negative 50 cc/hr with CVP about 14.  He is off NE, on vasopressin 0.03.  He remains on amiodarone gtt at 60 mg/hr + quinidine, no further VT.    RHC/LHC (9/20):  Coronary Findings  Diagnostic Dominance: Right Left Anterior Descending  Ost LAD to Prox LAD lesion is 40% stenosed.    First Diagonal Branch  1st Diag lesion is 60% stenosed.    Left Circumflex  Mid Cx lesion is 30% stenosed.    Right Coronary Artery  Prox RCA lesion is 30% stenosed.    Intervention   No interventions have been documented.   Right Heart  Right Heart Pressures RHC Procedural Findings: Hemodynamics (mmHg) RA mean 14 RV 52/15 PA 53/27, mean 37 PCWP mean 26 LV 97/29 AO 103/64  Oxygen saturations: PA 76% AO 100%  Cardiac Output (Fick) 8.98  Cardiac Index (Fick) 3.96 PVR 2.8 WU    Objective:   Weight Range: 119 kg Body mass index is 41.09 kg/m.   Vital Signs:   Temp:  [95.5 F (35.3 C)-98.6 F (37 C)] 98.4 F (36.9 C) (09/21 0700) Pulse Rate:  [0-74] 61 (09/21 0700) Resp:  [0-34] 18 (09/21 0700) BP: (80-120)/(41-83) 96/62 (09/21 0530) SpO2:  [96 %-100 %] 100 % (09/21 0700) Arterial Line BP: (81-111)/(51-73) 97/55 (09/21 0700) FiO2 (%):  [1 %-40 %] 40 % (09/21 0400) Weight:  [182 kg] 119 kg (09/21 0500) Last BM Date :  (PTA)  Weight change: Filed Weights   08/24/22 1031 08/24/22 1623 08/26/22 0500  Weight: 119 kg 119 kg 119 kg    Intake/Output:   Intake/Output Summary (Last 24 hours) at 08/26/2022 0726 Last data filed at 08/26/2022 0700 Gross per 24 hour  Intake 2248.98 ml  Output 3156 ml  Net -907.02 ml      Physical Exam    General: Intubated/sedated.  Neck: JVP 12+, no thyromegaly or thyroid nodule.  Lungs:  Clear to auscultation bilaterally with normal respiratory effort. CV: Nondisplaced PMI.  Heart regular S1/S2, no S3/S4, no murmur.  Trace ankle edema.  Abdomen: Soft, nontender, no hepatosplenomegaly, no distention.  Skin: Intact without lesions or rashes.  Neurologic: Sedated on vent.  Extremities: No clubbing or cyanosis.  HEENT: Normal.   Telemetry   NSR 60s with occasional PVCs (personally reviewed)   EKG    N/A  Labs    CBC Recent Labs    08/25/22 0414 08/25/22 0430 08/25/22 0904 08/26/22 0550  WBC 7.9  --   --  6.9  NEUTROABS 5.8  --   --  5.4  HGB 10.3*   < > 10.2*  10.2* 10.4*  HCT 30.3*   < > 30.0*  30.0* 31.6*  MCV 96.8  --   --  99.7  PLT 236  --   --  170   < > = values in this interval not displayed.   Basic Metabolic Panel Recent Labs    08/25/22 0414 08/25/22 0430 08/25/22 0904 08/25/22 2122 08/26/22 0550  NA 135   < > 132*  132* 135  --   K 4.6   < > 4.8  4.8 4.7  --   CL 91*  --   --  95*  --  CO2 25  --   --  22  --   GLUCOSE 148*  --   --  122*  --   BUN 38*  --   --  38*  --   CREATININE 10.40*  --   --  8.94*  --   CALCIUM 8.5*  --   --  7.9*  --   MG 2.5*  --   --  2.4 2.6*  PHOS 6.1*  --   --  5.6*  --    < > = values in this interval not displayed.   Liver Function Tests Recent Labs    08/24/22 1040 08/25/22 0414 08/25/22 2122  AST 15  --   --   ALT 18  --   --   ALKPHOS 62  --   --   BILITOT 0.5  --   --   PROT 7.6  --   --   ALBUMIN 3.5 3.1* 3.0*   No results for input(s): "LIPASE", "AMYLASE" in the last 72 hours. Cardiac Enzymes No results for input(s): "CKTOTAL", "CKMB", "CKMBINDEX", "TROPONINI" in the last 72 hours.  BNP: BNP (last 3 results) Recent Labs    05/25/22 0705 08/16/22 0627 08/16/22 1257  BNP 1,355.4* QUANTITY NOT SUFFICIENT, UNABLE TO PERFORM TEST 399.2*    ProBNP (last 3 results) No results for input(s): "PROBNP" in the last 8760 hours.   D-Dimer No results for input(s): "DDIMER" in the  last 72 hours. Hemoglobin A1C No results for input(s): "HGBA1C" in the last 72 hours. Fasting Lipid Panel No results for input(s): "CHOL", "HDL", "LDLCALC", "TRIG", "CHOLHDL", "LDLDIRECT" in the last 72 hours. Thyroid Function Tests Recent Labs    08/24/22 1603  TSH 5.964*  T3FREE 2.3    Other results:   Imaging    ECHOCARDIOGRAM COMPLETE  Result Date: 08/25/2022    ECHOCARDIOGRAM REPORT   Patient Name:   Victor Thompson Date of Exam: 08/25/2022 Medical Rec #:  [id number]      Height:       67.0 in Accession #:    [id number]     Weight:       262.3 lb Date of Birth:  1961/10/16     BSA:          2.269 m Patient Age:    70 years       BP:           107/79 mmHg Patient Gender: M              HR:           60 bpm. Exam Location:  Inpatient Procedure: 2D Echo, Cardiac Doppler and Color Doppler Indications:    CHF-acute systolic  History:        Patient has prior history of Echocardiogram examinations, most                 recent 10/21/2021. CHF, CAD, Defibrillator; Risk                 Factors:Hypertension and Sleep Apnea. ESRD.  Sonographer:    Clayton Lefort RDCS (AE) Referring Phys: [facility]  Sonographer Comments: Patient is obese and echo performed with patient supine and on artificial respirator. IMPRESSIONS  1. Left ventricular ejection fraction, by estimation, is 20 to 25%. The left ventricle has severely decreased function. The left ventricle demonstrates global hypokinesis. The left ventricular internal cavity size was severely dilated. Left ventricular diastolic parameters are consistent with Grade III diastolic  dysfunction (restrictive). Elevated left ventricular end-diastolic pressure.  2. Right ventricular systolic function is normal. The right ventricular size is mildly enlarged. There is mildly elevated pulmonary artery systolic pressure.  3. Left atrial size was moderately dilated.  4. Right atrial size was mildly dilated.  5. The mitral valve is normal in structure. Mild to  moderate mitral valve regurgitation. No evidence of mitral stenosis.  6. The aortic valve is grossly normal. Aortic valve regurgitation is not visualized. No aortic stenosis is present.  7. The inferior vena cava is dilated in size with <50% respiratory variability, suggesting right atrial pressure of 15 mmHg.  8. Cannot exclude a small PFO. Comparison(s): Changes from prior study are noted. LV chamber now severely dilated (7 cm). Conclusion(s)/Recommendation(s): No left ventricular mural or apical thrombus/thrombi. Severely reduced LVEF with severe LV dilation. FINDINGS  Left Ventricle: Left ventricular ejection fraction, by estimation, is 20 to 25%. The left ventricle has severely decreased function. The left ventricle demonstrates global hypokinesis. Definity contrast agent was given IV to delineate the left ventricular endocardial borders. The left ventricular internal cavity size was severely dilated. There is no left ventricular hypertrophy. Left ventricular diastolic parameters are consistent with Grade III diastolic dysfunction (restrictive). Elevated left ventricular end-diastolic pressure. Right Ventricle: The right ventricular size is mildly enlarged. Right vetricular wall thickness was not well visualized. Right ventricular systolic function is normal. There is mildly elevated pulmonary artery systolic pressure. The tricuspid regurgitant  velocity is 2.48 m/s, and with an assumed right atrial pressure of 15 mmHg, the estimated right ventricular systolic pressure is 21.3 mmHg. Left Atrium: Left atrial size was moderately dilated. Right Atrium: Right atrial size was mildly dilated. Pericardium: There is no evidence of pericardial effusion. Mitral Valve: The mitral valve is normal in structure. Mild to moderate mitral valve regurgitation. No evidence of mitral valve stenosis. Tricuspid Valve: The tricuspid valve is normal in structure. Tricuspid valve regurgitation is trivial. No evidence of tricuspid  stenosis. Aortic Valve: The aortic valve is grossly normal. Aortic valve regurgitation is not visualized. No aortic stenosis is present. Aortic valve mean gradient measures 2.0 mmHg. Aortic valve peak gradient measures 4.4 mmHg. Aortic valve area, by VTI measures 3.69 cm. Pulmonic Valve: The pulmonic valve was not well visualized. Pulmonic valve regurgitation is not visualized. Aorta: The aortic root and ascending aorta are structurally normal, with no evidence of dilitation and the aortic arch was not well visualized. Venous: The inferior vena cava is dilated in size with less than 50% respiratory variability, suggesting right atrial pressure of 15 mmHg. IAS/Shunts: Cannot exclude a small PFO.  LEFT VENTRICLE PLAX 2D LVIDd:         7.00 cm   Diastology LVIDs:         6.10 cm   LV e' medial:    2.62 cm/s LV PW:         1.00 cm   LV E/e' medial:  43.5 LV IVS:        0.90 cm   LV e' lateral:   2.75 cm/s LVOT diam:     2.50 cm   LV E/e' lateral: 41.5 LV SV:         86 LV SV Index:   38 LVOT Area:     4.91 cm  RIGHT VENTRICLE             IVC RV Basal diam:  4.00 cm     IVC diam: 2.50 cm RV Mid diam:    3.20  cm RV S prime:     10.70 cm/s TAPSE (M-mode): 2.5 cm LEFT ATRIUM             Index        RIGHT ATRIUM           Index LA diam:        4.50 cm 1.98 cm/m   RA Area:     20.90 cm LA Vol (A2C):   75.1 ml 33.09 ml/m  RA Volume:   62.20 ml  27.41 ml/m LA Vol (A4C):   65.2 ml 28.73 ml/m LA Biplane Vol: 73.5 ml 32.39 ml/m  AORTIC VALVE AV Area (Vmax):    3.88 cm AV Area (Vmean):   3.70 cm AV Area (VTI):     3.69 cm AV Vmax:           105.00 cm/s AV Vmean:          68.800 cm/s AV VTI:            0.234 m AV Peak Grad:      4.4 mmHg AV Mean Grad:      2.0 mmHg LVOT Vmax:         82.90 cm/s LVOT Vmean:        51.800 cm/s LVOT VTI:          0.176 m LVOT/AV VTI ratio: 0.75  AORTA Ao Root diam: 2.80 cm Ao Asc diam:  2.60 cm MITRAL VALVE                TRICUSPID VALVE MV Area (PHT): 3.85 cm     TR Peak grad:   24.6 mmHg  MV Decel Time: 197 msec     TR Vmax:        248.00 cm/s MR Peak grad: 56.6 mmHg MR Mean grad: 31.0 mmHg     SHUNTS MR Vmax:      376.00 cm/s   Systemic VTI:  0.18 m MR Vmean:     249.0 cm/s    Systemic Diam: 2.50 cm MV E velocity: 114.00 cm/s MV A velocity: 37.30 cm/s MV E/A ratio:  3.06 Buford Dresser MD Electronically signed by Buford Dresser MD Signature Date/Time: 08/25/2022/7:11:10 PM    Final    DG CHEST PORT 1 VIEW  Result Date: 08/25/2022 CLINICAL DATA:  Provided history: Abnormal ECG, intubated, shortness of breath, hypertension, pneumonia, CHF, asthma. EXAM: PORTABLE CHEST 1 VIEW COMPARISON:  Prior chest radiographs 08/24/2022 and earlier. FINDINGS: ET tube present with tip terminating 3.8 cm above the level of the carina. Three left-sided central venous catheters terminating in the region of the left brachiocephalic vein and brachiocephalic/SVC confluence. An enteric tube passes below the level of left hemidiaphragm with tip excluded from the field of view. Subcutaneous ICD again noted. Cardiomegaly. Central pulmonary vascular congestion without overt pulmonary edema. Mild ill-defined opacity within the left lung base, new from the prior examination. No appreciable airspace consolidation on the right. No evidence of pleural effusion or pneumothorax. Degenerative changes of the spine. IMPRESSION: Support apparatus, as described. Cardiomegaly with central pulmonary vascular congestion. No overt pulmonary edema. Mild ill-defined opacity within the left lung base, new from the prior examination. his may reflect atelectasis or early pneumonia. Correlate clinically and consider short-interval radiographic follow-up. Electronically Signed   By: Kellie Simmering D.O.   On: 08/25/2022 14:05   CARDIAC CATHETERIZATION  Result Date: 08/25/2022   1st Diag lesion is 60% stenosed.   Ost LAD to Prox LAD lesion is 40%  stenosed.   Mid Cx lesion is 30% stenosed.   Prox RCA lesion is 30% stenosed. 1.  Elevated right and left heart filling pressures with pulmonary venous hypertension. 2. Nonobstructive CAD. Needs CVVH for fluid removal.     Medications:     Scheduled Medications:  artificial tears  1 Application Both Eyes H4V   aspirin  81 mg Per Tube Daily   atorvastatin  80 mg Per Tube QHS   Chlorhexidine Gluconate Cloth  6 each Topical Daily   cholecalciferol  5,000 Units Per Tube Daily   docusate  100 mg Per Tube BID   fentaNYL (SUBLIMAZE) injection  50 mcg Intravenous Once   heparin  5,000 Units Subcutaneous Q8H   mouth rinse  15 mL Mouth Rinse Q2H   pantoprazole  40 mg Per Tube Daily   polyethylene glycol  17 g Per Tube Daily   quiNIDine sulfate  200 mg Per Tube Q8H   sodium chloride flush  10-40 mL Intracatheter Q12H   sodium chloride flush  3 mL Intravenous Q12H   sodium chloride flush  3 mL Intravenous Q12H    Infusions:   prismasol BGK 4/2.5 500 mL/hr at 08/26/22 0250    prismasol BGK 4/2.5 500 mL/hr at 08/26/22 0250   sodium chloride 20 mL/hr at 08/26/22 0700   sodium chloride     sodium chloride     amiodarone 60 mg/hr (08/26/22 0700)   fentaNYL infusion INTRAVENOUS 400 mcg/hr (08/26/22 0700)   heparin 10,000 units/ 20 mL infusion syringe 750 Units/hr (08/26/22 0652)   midazolam 10 mg/hr (08/26/22 0700)   norepinephrine (LEVOPHED) Adult infusion Stopped (08/26/22 0548)   prismasol BGK 4/2.5 1,500 mL/hr at 08/26/22 0601   vasopressin 0.03 Units/min (08/26/22 0700)    PRN Medications: Place/Maintain arterial line **AND** sodium chloride, sodium chloride, acetaminophen, albuterol, alteplase, fentaNYL, heparin, midazolam, ondansetron (ZOFRAN) IV, mouth rinse, sodium chloride, sodium chloride flush, sodium chloride flush    Patient Profile   Mr Ledet is 61 year old with a history of VT, ESRD, CAD, ischemic CMP, and ESRD.  He had PCI in 4259, uncertain what vessel was involved. Echo 2/22: EF 25-30% with moderate LV dilation and normal RV.  He had VT arrest in  2015, has [employer] subcutaneous ICD.  [name] in 2/22.  LHC/RHC was done in 8/22, showing no significant CAD and stable hemodynamics.   Admitted with refractory VT complicated by shock.    Assessment/Plan   1. VT storm: Has has a Animator ICD.  Refractory monomorphic VT. Multiple shocks.  Later alternating NSR with a slow wide complex AIVR-like rhythm. Now with NSR and no further VT.  Cath on 9/20 with nonobstructive CAD.  - Continue amiodarone gtt 60 mg/hr.  - EP has seen, started on quinidine.   - No further beta blocker with hypotension.  - Long term very concerning, not candidate for LVAD with ESRD.  Discussed with Dr. Quentin Ore and not candidate for VT ablation.  2. Acute on chronic systolic CHF with cardiogenic shock: In setting of incessant VT.  H/o NICM, echo this admission with EF 20-25%, normal RV function, mild-moderate MR, IVC dilated.  At last appointment, unable to complete cardiac MRI due to artifact from [facility] ICD.  PYP scan was negative.  RHC yesterday showed volume overload, today CVP is 14.  Currently off NE with vasopressin 0.03, pressors required in setting of VT and sedation.  - Continue CVVH, pull 50-100 cc/hr net negative.  -  Wean vasopressin as able today.    - Repeat echo.  3. CAD: ?PCI in 2005, unsure what vessel.  Coronary angiography in 8/22 showed no significant CAD and also does not appear to show prior stent so h/o CAD is questionable. LHC this admission with nonobstructive CAD.  - Continue ASA 81 and statin.  4. ESRD: Since 2/22. TTS HD.  - CVVH for now, pull 50-100 cc/hr net negative today . 5. Acute hypoxemic respiratory failure: In setting of VT storm.  - Continue volume removal via CVVH.  - Wean sedation, hopefully extubate today.   CRITICAL CARE Performed by: Loralie Champagne  Total critical care time: 40 minutes  Critical care time was exclusive of separately billable procedures and treating other patients.  Critical care was necessary to  treat or prevent imminent or life-threatening deterioration.  Critical care was time spent personally by me on the following activities: development of treatment plan with patient and/or surrogate as well as nursing, discussions with consultants, evaluation of patient's response to treatment, examination of patient, obtaining history from patient or surrogate, ordering and performing treatments and interventions, ordering and review of laboratory studies, ordering and review of radiographic studies, pulse oximetry and re-evaluation of patient's condition.  Loralie Champagne 08/26/2022 7:26 AM

## 2022-08-26 NOTE — Telephone Encounter (Signed)
Pharmacy Patient Advocate Encounter  Insurance verification completed.    The patient is insured through [employer]   The patient is currently admitted and ran test claims for the following: Quinidine '200mg'$  & '324mg'$ .  Copays and coinsurance results were relayed to Inpatient clinical team.

## 2022-08-26 NOTE — TOC Benefit Eligibility Note (Signed)
Patient Research scientist (life sciences) completed.     The patient is currently admitted and upon discharge could be taking Quinidine '200mg'$  or '324mg'$ .   The current 30 day co-pay is, $0.   The patient is insured through [employer].

## 2022-08-26 NOTE — Progress Notes (Addendum)
Electrophysiology Rounding Note  [name] Date of Encounter: 08/26/2022  Primary Cardiologist: Loralie Champagne, MD Electrophysiologist: Dr. Lovena Le   Subjective   NAEO.   Remains intubated/sedated.  Inpatient Medications    Scheduled Meds:  artificial tears  1 Application Both Eyes T7G   aspirin  81 mg Per Tube Daily   atorvastatin  80 mg Per Tube QHS   Chlorhexidine Gluconate Cloth  6 each Topical Daily   cholecalciferol  5,000 Units Per Tube Daily   docusate  100 mg Per Tube BID   fentaNYL (SUBLIMAZE) injection  50 mcg Intravenous Once   heparin  5,000 Units Subcutaneous Q8H   mouth rinse  15 mL Mouth Rinse Q2H   pantoprazole  40 mg Per Tube Daily   polyethylene glycol  17 g Per Tube Daily   quiNIDine sulfate  200 mg Per Tube Q8H   sodium chloride flush  10-40 mL Intracatheter Q12H   sodium chloride flush  3 mL Intravenous Q12H   sodium chloride flush  3 mL Intravenous Q12H   Continuous Infusions:   prismasol BGK 4/2.5 500 mL/hr at 08/26/22 0250    prismasol BGK 4/2.5 500 mL/hr at 08/26/22 0250   sodium chloride 20 mL/hr at 08/26/22 0700   sodium chloride     sodium chloride     amiodarone 60 mg/hr (08/26/22 0700)   fentaNYL infusion INTRAVENOUS 400 mcg/hr (08/26/22 0700)   heparin 10,000 units/ 20 mL infusion syringe 750 Units/hr (08/26/22 0652)   midazolam 10 mg/hr (08/26/22 0700)   norepinephrine (LEVOPHED) Adult infusion Stopped (08/26/22 0548)   prismasol BGK 4/2.5 1,500 mL/hr at 08/26/22 0601   vasopressin 0.03 Units/min (08/26/22 0700)   PRN Meds: Place/Maintain arterial line **AND** sodium chloride, sodium chloride, acetaminophen, albuterol, alteplase, fentaNYL, heparin, midazolam, ondansetron (ZOFRAN) IV, mouth rinse, sodium chloride, sodium chloride flush, sodium chloride flush   Vital Signs    Vitals:   08/26/22 0615 08/26/22 0630 08/26/22 0645 08/26/22 0700  BP:      Pulse: 62 65 65 61  Resp: '17 17 17 18  '$ Temp: (!) 97.5 F  (36.4 C) 98.1 F (36.7 C) 98.4 F (36.9 C) 98.4 F (36.9 C)  TempSrc:      SpO2: 100% 100% 100% 100%  Weight:      Height:        Intake/Output Summary (Last 24 hours) at 08/26/2022 0719 Last data filed at 08/26/2022 0700 Gross per 24 hour  Intake 2248.98 ml  Output 3156 ml  Net -907.02 ml   Filed Weights   08/24/22 1031 08/24/22 1623 08/26/22 0500  Weight: 119 kg 119 kg 119 kg    Physical Exam    GEN- The patient is intubated and sedated.  HEENT- + ET tube.  Lungs- +mechanical breathing sounds.  Heart- Regular rate and rhythm, no murmurs, rubs or gallops GI- soft Extremities- no clubbing or cyanosis. No edema Skin- no rash or lesion Neuro- intubated and sedated.    Labs    CBC Recent Labs    08/25/22 0414 08/25/22 0430 08/25/22 0904 08/26/22 0550  WBC 7.9  --   --  6.9  NEUTROABS 5.8  --   --  5.4  HGB 10.3*   < > 10.2*  10.2* 10.4*  HCT 30.3*   < > 30.0*  30.0* 31.6*  MCV 96.8  --   --  99.7  PLT 236  --   --  170   < > = values in this  interval not displayed.   Basic Metabolic Panel Recent Labs    08/25/22 0414 08/25/22 0430 08/25/22 0904 08/25/22 2122 08/26/22 0550  NA 135   < > 132*  132* 135  --   K 4.6   < > 4.8  4.8 4.7  --   CL 91*  --   --  95*  --   CO2 25  --   --  22  --   GLUCOSE 148*  --   --  122*  --   BUN 38*  --   --  38*  --   CREATININE 10.40*  --   --  8.94*  --   CALCIUM 8.5*  --   --  7.9*  --   MG 2.5*  --   --  2.4 2.6*  PHOS 6.1*  --   --  5.6*  --    < > = values in this interval not displayed.   Liver Function Tests Recent Labs    08/24/22 1040 08/25/22 0414 08/25/22 2122  AST 15  --   --   ALT 18  --   --   ALKPHOS 62  --   --   BILITOT 0.5  --   --   PROT 7.6  --   --   ALBUMIN 3.5 3.1* 3.0*   No results for input(s): "LIPASE", "AMYLASE" in the last 72 hours. Cardiac Enzymes No results for input(s): "CKTOTAL", "CKMB", "CKMBINDEX", "TROPONINI" in the last 72 hours.   Telemetry    NSR 50-70s  (personally reviewed)  Radiology    ECHOCARDIOGRAM COMPLETE  Result Date: 08/25/2022    ECHOCARDIOGRAM REPORT   Patient Name:   Victor Thompson Date of Exam: 08/25/2022 Medical Rec #:  [id number]      Height:       67.0 in Accession #:    [id number]     Weight:       262.3 lb Date of Birth:  01/26/1961     BSA:          2.269 m Patient Age:    61 years       BP:           107/79 mmHg Patient Gender: M              HR:           60 bpm. Exam Location:  Inpatient Procedure: 2D Echo, Cardiac Doppler and Color Doppler Indications:    CHF-acute systolic  History:        Patient has prior history of Echocardiogram examinations, most                 recent 10/21/2021. CHF, CAD, Defibrillator; Risk                 Factors:Hypertension and Sleep Apnea. ESRD.  Sonographer:    Clayton Lefort RDCS (AE) Referring Phys: Elby Showers [facility]  Sonographer Comments: Patient is obese and echo performed with patient supine and on artificial respirator. IMPRESSIONS  1. Left ventricular ejection fraction, by estimation, is 20 to 25%. The left ventricle has severely decreased function. The left ventricle demonstrates global hypokinesis. The left ventricular internal cavity size was severely dilated. Left ventricular diastolic parameters are consistent with Grade III diastolic dysfunction (restrictive). Elevated left ventricular end-diastolic pressure.  2. Right ventricular systolic function is normal. The right ventricular size is mildly enlarged. There is mildly elevated pulmonary artery systolic pressure.  3. Left atrial size  was moderately dilated.  4. Right atrial size was mildly dilated.  5. The mitral valve is normal in structure. Mild to moderate mitral valve regurgitation. No evidence of mitral stenosis.  6. The aortic valve is grossly normal. Aortic valve regurgitation is not visualized. No aortic stenosis is present.  7. The inferior vena cava is dilated in size with <50% respiratory variability, suggesting right atrial pressure of  15 mmHg.  8. Cannot exclude a small PFO. Comparison(s): Changes from prior study are noted. LV chamber now severely dilated (7 cm). Conclusion(s)/Recommendation(s): No left ventricular mural or apical thrombus/thrombi. Severely reduced LVEF with severe LV dilation. FINDINGS  Left Ventricle: Left ventricular ejection fraction, by estimation, is 20 to 25%. The left ventricle has severely decreased function. The left ventricle demonstrates global hypokinesis. Definity contrast agent was given IV to delineate the left ventricular endocardial borders. The left ventricular internal cavity size was severely dilated. There is no left ventricular hypertrophy. Left ventricular diastolic parameters are consistent with Grade III diastolic dysfunction (restrictive). Elevated left ventricular end-diastolic pressure. Right Ventricle: The right ventricular size is mildly enlarged. Right vetricular wall thickness was not well visualized. Right ventricular systolic function is normal. There is mildly elevated pulmonary artery systolic pressure. The tricuspid regurgitant  velocity is 2.48 m/s, and with an assumed right atrial pressure of 15 mmHg, the estimated right ventricular systolic pressure is 34.1 mmHg. Left Atrium: Left atrial size was moderately dilated. Right Atrium: Right atrial size was mildly dilated. Pericardium: There is no evidence of pericardial effusion. Mitral Valve: The mitral valve is normal in structure. Mild to moderate mitral valve regurgitation. No evidence of mitral valve stenosis. Tricuspid Valve: The tricuspid valve is normal in structure. Tricuspid valve regurgitation is trivial. No evidence of tricuspid stenosis. Aortic Valve: The aortic valve is grossly normal. Aortic valve regurgitation is not visualized. No aortic stenosis is present. Aortic valve mean gradient measures 2.0 mmHg. Aortic valve peak gradient measures 4.4 mmHg. Aortic valve area, by VTI measures 3.69 cm. Pulmonic Valve: The pulmonic valve  was not well visualized. Pulmonic valve regurgitation is not visualized. Aorta: The aortic root and ascending aorta are structurally normal, with no evidence of dilitation and the aortic arch was not well visualized. Venous: The inferior vena cava is dilated in size with less than 50% respiratory variability, suggesting right atrial pressure of 15 mmHg. IAS/Shunts: Cannot exclude a small PFO.  LEFT VENTRICLE PLAX 2D LVIDd:         7.00 cm   Diastology LVIDs:         6.10 cm   LV e' medial:    2.62 cm/s LV PW:         1.00 cm   LV E/e' medial:  43.5 LV IVS:        0.90 cm   LV e' lateral:   2.75 cm/s LVOT diam:     2.50 cm   LV E/e' lateral: 41.5 LV SV:         86 LV SV Index:   38 LVOT Area:     4.91 cm  RIGHT VENTRICLE             IVC RV Basal diam:  4.00 cm     IVC diam: 2.50 cm RV Mid diam:    3.20 cm RV S prime:     10.70 cm/s TAPSE (M-mode): 2.5 cm LEFT ATRIUM             Index  RIGHT ATRIUM           Index LA diam:        4.50 cm 1.98 cm/m   RA Area:     20.90 cm LA Vol (A2C):   75.1 ml 33.09 ml/m  RA Volume:   62.20 ml  27.41 ml/m LA Vol (A4C):   65.2 ml 28.73 ml/m LA Biplane Vol: 73.5 ml 32.39 ml/m  AORTIC VALVE AV Area (Vmax):    3.88 cm AV Area (Vmean):   3.70 cm AV Area (VTI):     3.69 cm AV Vmax:           105.00 cm/s AV Vmean:          68.800 cm/s AV VTI:            0.234 m AV Peak Grad:      4.4 mmHg AV Mean Grad:      2.0 mmHg LVOT Vmax:         82.90 cm/s LVOT Vmean:        51.800 cm/s LVOT VTI:          0.176 m LVOT/AV VTI ratio: 0.75  AORTA Ao Root diam: 2.80 cm Ao Asc diam:  2.60 cm MITRAL VALVE                TRICUSPID VALVE MV Area (PHT): 3.85 cm     TR Peak grad:   24.6 mmHg MV Decel Time: 197 msec     TR Vmax:        248.00 cm/s MR Peak grad: 56.6 mmHg MR Mean grad: 31.0 mmHg     SHUNTS MR Vmax:      376.00 cm/s   Systemic VTI:  0.18 m MR Vmean:     249.0 cm/s    Systemic Diam: 2.50 cm MV E velocity: 114.00 cm/s MV A velocity: 37.30 cm/s MV E/A ratio:  3.06 Buford Dresser MD Electronically signed by Buford Dresser MD Signature Date/Time: 08/25/2022/7:11:10 PM    Final    DG CHEST PORT 1 VIEW  Result Date: 08/25/2022 CLINICAL DATA:  Provided history: Abnormal ECG, intubated, shortness of breath, hypertension, pneumonia, CHF, asthma. EXAM: PORTABLE CHEST 1 VIEW COMPARISON:  Prior chest radiographs 08/24/2022 and earlier. FINDINGS: ET tube present with tip terminating 3.8 cm above the level of the carina. Three left-sided central venous catheters terminating in the region of the left brachiocephalic vein and brachiocephalic/SVC confluence. An enteric tube passes below the level of left hemidiaphragm with tip excluded from the field of view. Subcutaneous ICD again noted. Cardiomegaly. Central pulmonary vascular congestion without overt pulmonary edema. Mild ill-defined opacity within the left lung base, new from the prior examination. No appreciable airspace consolidation on the right. No evidence of pleural effusion or pneumothorax. Degenerative changes of the spine. IMPRESSION: Support apparatus, as described. Cardiomegaly with central pulmonary vascular congestion. No overt pulmonary edema. Mild ill-defined opacity within the left lung base, new from the prior examination. his may reflect atelectasis or early pneumonia. Correlate clinically and consider short-interval radiographic follow-up. Electronically Signed   By: Kellie Simmering D.O.   On: 08/25/2022 14:05   CARDIAC CATHETERIZATION  Result Date: 08/25/2022   1st Diag lesion is 60% stenosed.   Ost LAD to Prox LAD lesion is 40% stenosed.   Mid Cx lesion is 30% stenosed.   Prox RCA lesion is 30% stenosed. 1. Elevated right and left heart filling pressures with pulmonary venous hypertension. 2. Nonobstructive CAD. Needs CVVH for fluid  removal.   DG Chest Portable 1 View  Result Date: 08/24/2022 CLINICAL DATA:  Post extubation.  Central line placement. EXAM: PORTABLE CHEST 1 VIEW COMPARISON:  AP chest  08/24/2022 at 1028 hours FINDINGS: Pacing device pads overlie the left scapula, axilla, lateral left hemithorax, and lower left hemithorax, limiting evaluation of these regions. Left chest wall electronic device with apparent leads overlying the heart and mediastinum are again seen, compatible with an implantable cardiac defibrillator. New left internal jugular central venous catheter tip overlies the junction of the left brachiocephalic vein and superior vena cava. New endotracheal tube with tip terminating approximately 3.1 cm above the carina. New enteric tube descends below the diaphragm with the tip excluded by collimation. Cardiac silhouette is again mildly enlarged. Mediastinal contours are within normal limits. Mild calcification within the aortic arch. Mild bilateral interstitial thickening is unchanged. No pleural effusion or pneumothorax. Moderate multilevel degenerative bridging osteophytes throughout the thoracic spine. IMPRESSION: 1. Interval placement of left internal jugular central venous catheter with tip overlying the junction of the left brachiocephalic vein and superior vena cava. No pneumothorax. 2. Interval placement of enteric tube which descends below the diaphragm with the tip excluded by collimation. 3. Interval placement of endotracheal tube with tip terminating approximately 3.1 cm above the carina. Electronically Signed   By: Yvonne Kendall M.D.   On: 08/24/2022 15:12   DG Chest Port 1 View  Result Date: 08/24/2022 CLINICAL DATA:  Ventricular tachycardia EXAM: PORTABLE CHEST 1 VIEW COMPARISON:  08/16/2022 FINDINGS: Subcutaneous ICD noted. Distal tip about the vertical level of the upper aortic arch. Mild cardiomegaly. The lungs appear clear. Previous signs of pulmonary venous hypertension are no longer readily apparent. No blunting of the costophrenic angles. Thoracic spondylosis. IMPRESSION: 1. Mild cardiomegaly, without edema. 2. Subcutaneous ICD noted. Electronically Signed   By:  Van Clines M.D.   On: 08/24/2022 10:41    Patient Profile     Victor Thompson is a 61 y.o. male with a hx of  hx of VF arrest (2015), CAD (PCI in 2005, unknown details), ICM,. Chronic CHF (systolic), ESRF on HD , OSA w/CPAP, HTN, HLD, recurrent angio edema who was admitted for VT storm.   Cath 08/25/2022   1st Diag lesion is 60% stenosed.   Ost LAD to Prox LAD lesion is 40% stenosed.   Mid Cx lesion is 30% stenosed.   Prox RCA lesion is 30% stenosed.   1. Elevated right and left heart filling pressures with pulmonary venous hypertension.  2. Nonobstructive CAD.    RA mean 14 RV 52/15 PA 53/27, mean 37 PCWP mean 26 LV 97/29 AO 103/64  Oxygen saturations: PA 76% AO 100%  Cardiac Output (Fick) 8.98  Cardiac Index (Fick) 3.96 PVR 2.8 WU  Assessment & Plan    1.  VT storm / refractory VT Remains quiescent.  Received a total of 14 shocks from his device and multiple external defibs as well. Battery now 16% to ERI (was 74% in July) Potassium4.7 (09/20 2122) Magnesium  2.6* (09/21 0550) Creatinine, ser  8.94* (09/20 2122) Keep K > 4.0 and Mg > 2.0  Continue amiodarone Continue quinidine 200 TID. Pharm D to help assess availability as an outpatient.  Has allergy listed to lidocaine. With multiple sources of recurrent angioedema, would not rechallenge or recommend trying mexitil   2. Chronic systolic CHF Coronary angiography in 07/2021 showed no significant CAD, and also did not appear to show prior "stent" which had been reported.  R/LHC  08/25/2022 with Moderate non obstructive CAD and elevated filling pressures as above.  PYP scan negative  Planning for PET as outpatient for possible Sarcoid.  ECG with IVCD 150 msec, thought not to be good candidate for CRT upgrade (not true LBBB)   3. ESRD Nephrology following.  Currently on CRRT   4. Intubation Remains Intubated and sedated for incessant VT with repeated shocks and airway protection [employer]  stopped. Appreciate HF and CCM care.   Patient remains critically ill and in danger of multiple system organ failure.    Dr. Lovena Le has seen. Plan to continue amiodarone 60 mg/hr for now.  If he is extubated and stable this afternoon, would plan to decrease to 30 mg/hr this evening. Otherwise, continue 60 mg/hr.    For questions or updates, please contact [place] Please consult [email or web address] for contact info under Cardiology/STEMI.  Signed, Shirley Friar, PA-C  08/26/2022, 7:19 AM   EP Attending  Remains intubate but sedation has been weaned in hopes of extubation. No additional VT over the last 24 hours. His pressor requirements have improved. I have recoomended he undergo weaning trials, and continue IV amio. His skin is warm and CV reveals a RRR and lungs are clear with mild basilar rales. Abdomen is soft. Ext are warm.  A/P VT storm - he will continue the amiodarone. Hopefully the can remain quiet.  ICD - his battery has taken a hit. We will follow. Chronic systolic heart failure - results of right and left heart cath noted.  Obesity - this is contributing.   Carleene Overlie Tatyana Biber,MD

## 2022-08-27 ENCOUNTER — Other Ambulatory Visit (HOSPITAL_COMMUNITY): Payer: Self-pay

## 2022-08-27 ENCOUNTER — Ambulatory Visit: Payer: Self-pay

## 2022-08-27 LAB — RENAL FUNCTION PANEL
Albumin: 2.9 g/dL — ABNORMAL LOW (ref 3.5–5.0)
Anion gap: 9 (ref 5–15)
BUN: 20 mg/dL (ref 6–20)
CO2: 26 mmol/L (ref 22–32)
Calcium: 8.2 mg/dL — ABNORMAL LOW (ref 8.9–10.3)
Chloride: 101 mmol/L (ref 98–111)
Creatinine, Ser: 4.65 mg/dL — ABNORMAL HIGH (ref 0.61–1.24)
GFR, Estimated: 14 mL/min — ABNORMAL LOW (ref >60)
Glucose, Bld: 97 mg/dL (ref 70–99)
Phosphorus: 3.3 mg/dL (ref 2.5–4.6)
Potassium: 4.6 mmol/L (ref 3.5–5.1)
Sodium: 136 mmol/L (ref 135–145)

## 2022-08-27 LAB — CBC WITH DIFFERENTIAL/PLATELET
Abs Immature Granulocytes: 0.04 10*3/uL (ref 0.00–0.07)
Basophils Absolute: 0 10*3/uL (ref 0.0–0.1)
Basophils Relative: 0 %
Eosinophils Absolute: 0.1 10*3/uL (ref 0.0–0.5)
Eosinophils Relative: 2 %
HCT: 29.8 % — ABNORMAL LOW (ref 39.0–52.0)
Hemoglobin: 10 g/dL — ABNORMAL LOW (ref 13.0–17.0)
Immature Granulocytes: 0 %
Lymphocytes Relative: 4 %
Lymphs Abs: 0.4 10*3/uL — ABNORMAL LOW (ref 0.7–4.0)
MCH: 33.6 pg (ref 26.0–34.0)
MCHC: 33.6 g/dL (ref 30.0–36.0)
MCV: 100 fL (ref 80.0–100.0)
Monocytes Absolute: 0.9 10*3/uL (ref 0.1–1.0)
Monocytes Relative: 10 %
Neutro Abs: 7.8 10*3/uL — ABNORMAL HIGH (ref 1.7–7.7)
Neutrophils Relative %: 84 %
Platelets: 162 10*3/uL (ref 150–400)
RBC: 2.98 MIL/uL — ABNORMAL LOW (ref 4.22–5.81)
RDW: 16 % — ABNORMAL HIGH (ref 11.5–15.5)
WBC: 9.2 10*3/uL (ref 4.0–10.5)
nRBC: 0 % (ref 0.0–0.2)

## 2022-08-27 LAB — GLUCOSE, CAPILLARY
Glucose-Capillary: 105 mg/dL — ABNORMAL HIGH (ref 70–99)
Glucose-Capillary: 113 mg/dL — ABNORMAL HIGH (ref 70–99)
Glucose-Capillary: 121 mg/dL — ABNORMAL HIGH (ref 70–99)
Glucose-Capillary: 94 mg/dL (ref 70–99)

## 2022-08-27 LAB — LIPOPROTEIN A (LPA): Lipoprotein (a): 166.8 nmol/L — ABNORMAL HIGH (ref <75.0)

## 2022-08-27 LAB — APTT: aPTT: 81 seconds — ABNORMAL HIGH (ref 24–36)

## 2022-08-27 LAB — MAGNESIUM: Magnesium: 2.7 mg/dL — ABNORMAL HIGH (ref 1.7–2.4)

## 2022-08-27 MED ORDER — AMIODARONE HCL IN DEXTROSE 360-4.14 MG/200ML-% IV SOLN
30.0000 mg/h | INTRAVENOUS | Status: DC
  Administered 2022-08-28 – 2022-08-31 (×6): 30 mg/h via INTRAVENOUS
  Administered 2022-08-31: 30.006 mg/h via INTRAVENOUS
  Administered 2022-09-01 (×3): 30 mg/h via INTRAVENOUS
  Administered 2022-09-02: 60 mg/h via INTRAVENOUS
  Administered 2022-09-02: 30 mg/h via INTRAVENOUS
  Administered 2022-09-02 (×2): 60 mg/h via INTRAVENOUS
  Administered 2022-09-03: 30 mg/h via INTRAVENOUS
  Administered 2022-09-03: 60 mg/h via INTRAVENOUS
  Administered 2022-09-03 – 2022-09-07 (×7): 30 mg/h via INTRAVENOUS
  Filled 2022-08-27 (×24): qty 200

## 2022-08-27 MED ORDER — CHLORHEXIDINE GLUCONATE CLOTH 2 % EX PADS
6.0000 | MEDICATED_PAD | Freq: Every day | CUTANEOUS | Status: DC
  Administered 2022-08-28 – 2022-09-06 (×9): 6 via TOPICAL

## 2022-08-27 MED ORDER — HEPARIN SODIUM (PORCINE) 1000 UNIT/ML DIALYSIS
2000.0000 [IU] | INTRAMUSCULAR | Status: AC | PRN
  Administered 2022-08-28: 2000 [IU] via INTRAVENOUS_CENTRAL
  Filled 2022-08-27 (×2): qty 2

## 2022-08-27 MED ORDER — ORAL CARE MOUTH RINSE: 15.0000 mL | OROMUCOSAL | Status: DC | PRN

## 2022-08-27 NOTE — Progress Notes (Signed)
Pt placed on PS/CPAP 10/5 on 40% and is tolerating well. RT will monitor.

## 2022-08-27 NOTE — Patient Outreach (Signed)
  Care Coordination   Follow Up Visit Note   08/27/2022 Name: KERON NEENAN MRN: [id number] DOB: 15-May-1961  DOMNICK CHERVENAK is a 61 y.o. year old male who sees [name], MD for primary care. I reviewed patients chart in preparation to contact patient, noted patient is currently admitted into [facility]   What matters to the patients health and wellness today?  Not applicable     Goals Addressed             This Visit's Progress    Care Coordination Activities - further follow up needed       Care Coordination Interventions: Reviewed chart in preparation to contact patient, noted patient was admitted to [facility] on 08/24/22 with dx: Ventricular tachyarrhythmia, patient remains to be inpatient         SDOH assessments and interventions completed:  No     Care Coordination Interventions Activated:  Yes  Care Coordination Interventions:  Yes, provided   Follow up plan: Follow up call scheduled for 09/01/22 '@10'$ :45 AM     Encounter Outcome:  Pt. Visit Completed

## 2022-08-27 NOTE — Progress Notes (Signed)
[facility] Progress Note  Subjective: off pressors and extubated this am. Net neg 2.5 L yesterday. CVP down to 7. Pt interactive.   Vitals:   08/27/22 0800 08/27/22 0900 08/27/22 1000 08/27/22 1100  BP: 125/69     Pulse: 64 70 69 64  Resp: '11 16 17 10  '$ Temp: 98.1 F (36.7 C)     TempSrc: Oral     SpO2: 100% 100% 100% 100%  Weight:      Height:        Exam:  alert, nad   no jvd  Chest cta bilat  Cor reg no RG  Abd soft ntnd no ascites   Ext trace LE edema   Alert, NF, ox3    AVF+bruit    Home meds include - albuterol, amiodarone, aspirin, atorvastatin, auryxia 1 ac tid, calc acetate 2 ac, carvedilol 6.25 bid, colace, symbicort, velphoro 2 ac tid, prns/ vits/ supps    OP HD: [place] [facility]  4h 42mn  450/ 1.5  116.4kg  AVF  15g  Hep 3000 - hectorol 7 ug iv tiw - no esa     Assessment/ Plan: VT storm / VF - pt rx'd w/ IV amio, po quinidine, sedation / intubation and paralytics. Also sp procainamide. No further VT, now extubated.  Shock/ hypotension - resolved, off pressors, BP's wnl.  ESRD - on HD TTS. CRRT started 9/20 and will be dc'd today as vol issues are now resolved. Plan short HD tomorrow, then resume TTS.  HFrEF / EF 20-25%/ sp ICD Volume - much improved, 1kg up today Anemia esrd - Hb 10-12, no esa needs for now MBD ckd - CCa in range, phos a bit high. Resume binders when eating.   Rob Neveen Daponte 08/27/2022, 1:08 PM   Recent Labs  Lab 08/26/22 0550 08/26/22 1557 08/27/22 0430  HGB 10.4*  --  10.0*  ALBUMIN 3.2* 3.3* 2.9*  CALCIUM 8.3* 7.9* 8.2*  PHOS 4.7* 3.9 3.3  CREATININE 7.35* 6.08* 4.65*  K 5.2* 5.0 4.6    No results for input(s): "IRON", "TIBC", "FERRITIN" in the last 168 hours. Inpatient medications:  aspirin  81 mg Per Tube Daily   atorvastatin  80 mg Per Tube QHS   Chlorhexidine Gluconate Cloth  6 each Topical Daily   cholecalciferol  5,000 Units Per Tube Daily   docusate  100 mg Per Tube BID   heparin  5,000 Units  Subcutaneous Q8H   mouth rinse  15 mL Mouth Rinse Q2H   pantoprazole  40 mg Per Tube Daily   polyethylene glycol  17 g Per Tube Daily   quiNIDine sulfate  200 mg Per Tube Q8H   sodium chloride flush  10-40 mL Intracatheter Q12H    sodium chloride Stopped (08/26/22 1922)   sodium chloride     amiodarone 60 mg/hr (08/27/22 1200)   fentaNYL infusion INTRAVENOUS Stopped (08/27/22 0339)   midazolam Stopped (08/27/22 0312)   norepinephrine (LEVOPHED) Adult infusion Stopped (08/27/22 0233)   vasopressin Stopped (08/26/22 0907)   Place/Maintain arterial line **AND** sodium chloride, acetaminophen, albuterol, fentaNYL, midazolam, ondansetron (ZOFRAN) IV, mouth rinse, sodium chloride flush

## 2022-08-27 NOTE — Progress Notes (Signed)
NAME:  Victor Thompson, MRN:  [id number], DOB:  1961-06-20, LOS: 3 ADMISSION DATE:  08/24/2022, CONSULTATION DATE:  08/27/22 REFERRING MD:  Aundra Dubin - HF, CHIEF COMPLAINT:  VT storm   History of Present Illness:  61 year old man who presented from HD with palpitations. Felt faint on HD.   In ED , recurrent episodes of VT with repeated [place] IVD discharges.  Denied chest pain or  dyspnea.    EF 25-30%. Followed by Dr Aundra Dubin.  Prior episodes of VT, on amiodarone chronically  Pertinent  Medical History   Past Medical History:  Diagnosis Date   Acute on chronic systolic CHF (congestive heart failure) ([place]) 06/14/2020   Acute respiratory failure ([place]) 05/28/2022   AICD (automatic cardioverter/defibrillator) present    2015   Allergy    Asthma    no meds   Cardiac arrest [facility]) 2015   Chest pain 05/25/2022   CHF (congestive heart failure) (H[facility])    Coronary artery disease    ESRD on hemodialysis ([facility])    ckd -stage 5   Hyperlipidemia    Hypertension    Myocardial infarction ([place])    Obesity    Pneumonia    Shortness of breath dyspnea    Sleep apnea    USES CPAP   Wears glasses      Significant Hospital Events: Including procedures, antibiotic start and stop dates in addition to other pertinent events   9/19 - intubated, central line placed 9/20 - R/LHC performed, CVC placed into left internal jugular vein for CRRT 9/21 - no further VT  9/22 - extubated, off pressors, off sedation, right radial arterial line removed, CRRT dc'd  Interim History / Subjective:  Overall improving. Extubated this morning and transitioned to 4 L [place], which pt is tolerating well. He awake, alert, and able to speak. Denies pain and feels well enough to ask to go home.  Volume status improved. Discontinuing CRRT to transition back to intermittent HD.  No VT on telemetry.  Objective   Blood pressure (!) 112/42, pulse 69, temperature 98.1 F (36.7 C), temperature source Oral, resp. rate 10, height '5\' 7"'$   (1.702 m), weight 117.6 kg, SpO2 100 %. CVP:  [0 mmHg-15 mmHg] 7 mmHg  Vent Mode: PSV;CPAP FiO2 (%):  [40 %] 40 % Set Rate:  [18 bmp] 18 bmp Vt Set:  [520 mL] 520 mL PEEP:  [5 cmH20] 5 cmH20 Pressure Support:  [10 cmH20] 10 cmH20 Plateau Pressure:  [17 cmH20-24 cmH20] 17 cmH20   Intake/Output Summary (Last 24 hours) at 08/27/2022 1351 Last data filed at 08/27/2022 1300 Gross per 24 hour  Intake 1160.21 ml  Output 3913 ml  Net -2752.79 ml   Filed Weights   08/24/22 1623 08/26/22 0500 08/27/22 0400  Weight: 119 kg 119 kg 117.6 kg    Examination: General: Obese male in NAD. Awake and calm. HENT: No abnormalities. No JVD. Lungs: Normal respiratory effort on 4 L [place]. Mild coarse breath sounds following extubation. Cardiovascular: Regular rate and rhythm. Nondisplaced PMI. Abdomen: Soft, nondistended. Bowel sounds present. Extremities: No cyanosis, no lower extremity edema. RUE AVF. Neuro: Alert and oriented x3.  Ancillary tests personally reviewed:   Few PVC only on telemetry.  Echo shows EF 20% with GIII DD.   Assessment & Plan:  Ventricular storm  - Extubated, no longer sedated - S/p procainamide load 9/19 - No sign of ischemic cause on R/LHC, likely due to volume overload with PWCP 26 - Continue amiodarone - Continue  quinidine - Monitor electrolytes and replete as needed, goal of K > 4.0 and Mg > 2.0   2. Shock in setting of ventricular storm, improved - Ventricular storm management per above - No longer requiring pressor support, hemodynamically stable  3. Chronic systolic CHF - EF 86-38% - Volume status improved after CRRT  4. ESRD on HD - Discontinued CRRT - Resume intermittent HD  Best Practice (right click and "Reselect all SmartList Selections" daily)   Diet/type: advance as tolerated DVT prophylaxis: systemic heparin GI prophylaxis: PPI Lines: Central line and Dialysis Catheter Foley:  N/A Code Status:  full code Last date of multidisciplinary goals  of care discussion '[ ]'$      Zonia Kief, MS4

## 2022-08-27 NOTE — Procedures (Signed)
Extubation Procedure Note  Patient Details:   Name: Victor Thompson DOB: Feb 26, 1961 MRN: [id number]   Airway Documentation:    Vent end date: 08/27/22 Vent end time: 0900   Evaluation  O2 sats: stable throughout Complications: No apparent complications Patient did tolerate procedure well. Bilateral Breath Sounds: Clear, Diminished   Yes  Pt extubated to [place] with RN at bedside. Positive cuff leak noted and pt is tolerating well. RT will monitor.    Cathie Olden 08/27/2022, 9:16 AM

## 2022-08-27 NOTE — Progress Notes (Signed)
Pt receives out-pt HD at [facility] on TTS. Pt arrives at 5:15 am for 5:30 am chair time. Will assist as needed.   Melven Sartorius Renal Navigator [phone]

## 2022-08-27 NOTE — Progress Notes (Signed)
Patient ID: Victor Thompson, male   DOB: Jul 21, 1961, 61 y.o.   MRN: [id number]     Advanced Heart Failure Rounding Note  PCP-Cardiologist: Victor Champagne, MD   Subjective:    Admitted with VT storm.  Currently intubated on CVVH.  Pulling net negative 100-150 cc/hr with CVP about 7 today.  He is off NE and vasopressin.  He remains on amiodarone gtt at 60 mg/hr + quinidine, no further VT.    He is awake, follows commands.   RHC/LHC (9/20):  Coronary Findings  Diagnostic Dominance: Right Left Anterior Descending  Ost LAD to Prox LAD lesion is 40% stenosed.    First Diagonal Branch  1st Diag lesion is 60% stenosed.    Left Circumflex  Mid Cx lesion is 30% stenosed.    Right Coronary Artery  Prox RCA lesion is 30% stenosed.    Intervention   No interventions have been documented.   Right Heart  Right Heart Pressures RHC Procedural Findings: Hemodynamics (mmHg) RA mean 14 RV 52/15 PA 53/27, mean 37 PCWP mean 26 LV 97/29 AO 103/64  Oxygen saturations: PA 76% AO 100%  Cardiac Output (Fick) 8.98  Cardiac Index (Fick) 3.96 PVR 2.8 WU    Objective:   Weight Range: 117.6 kg Body mass index is 40.61 kg/m.   Vital Signs:   Temp:  [96.8 F (36 C)-99 F (37.2 C)] 97.9 F (36.6 C) (09/22 0400) Pulse Rate:  [53-80] 53 (09/22 0500) Resp:  [12-22] 18 (09/22 0654) BP: (95-134)/(49-78) 102/78 (09/22 0654) SpO2:  [95 %-100 %] 100 % (09/22 0500) Arterial Line BP: (92-128)/(45-67) 119/57 (09/22 0654) FiO2 (%):  [40 %] 40 % (09/22 0400) Weight:  [117.6 kg] 117.6 kg (09/22 0400) Last BM Date :  (PTA)  Weight change: Filed Weights   08/24/22 1623 08/26/22 0500 08/27/22 0400  Weight: 119 kg 119 kg 117.6 kg    Intake/Output:   Intake/Output Summary (Last 24 hours) at 08/27/2022 2355 Last data filed at 08/27/2022 0700 Gross per 24 hour  Intake 1151.8 ml  Output 4263 ml  Net -3111.2 ml      Physical Exam    General: NAD, intubated Neck: No JVD, no thyromegaly  or thyroid nodule.  Lungs: Clear to auscultation bilaterally with normal respiratory effort. CV: Nondisplaced PMI.  Heart regular S1/S2, no S3/S4, no murmur.  No peripheral edema.  No carotid bruit.  Normal pedal pulses.  Abdomen: Soft, nontender, no hepatosplenomegaly, no distention.  Skin: Intact without lesions or rashes.  Neurologic: Awake/follows commands.  Extremities: No clubbing or cyanosis.  HEENT: Normal.    Telemetry   NSR 60s with occasional PVCs, no VT (personally reviewed)   EKG    N/A  Labs    CBC Recent Labs    08/26/22 0550 08/27/22 0430  WBC 6.9 9.2  NEUTROABS 5.4 7.8*  HGB 10.4* 10.0*  HCT 31.6* 29.8*  MCV 99.7 100.0  PLT 170 732   Basic Metabolic Panel Recent Labs    08/26/22 0550 08/26/22 1557 08/27/22 0430  NA 134* 134* 136  K 5.2* 5.0 4.6  CL 98 102 101  CO2 '26 27 26  '$ GLUCOSE 109* 104* 97  BUN 32* 24* 20  CREATININE 7.35* 6.08* 4.65*  CALCIUM 8.3* 7.9* 8.2*  MG 2.6*  --  2.7*  PHOS 4.7* 3.9 3.3   Liver Function Tests Recent Labs    08/24/22 1040 08/25/22 0414 08/26/22 1557 08/27/22 0430  AST 15  --   --   --  ALT 18  --   --   --   ALKPHOS 62  --   --   --   BILITOT 0.5  --   --   --   PROT 7.6  --   --   --   ALBUMIN 3.5   < > 3.3* 2.9*   < > = values in this interval not displayed.   No results for input(s): "LIPASE", "AMYLASE" in the last 72 hours. Cardiac Enzymes No results for input(s): "CKTOTAL", "CKMB", "CKMBINDEX", "TROPONINI" in the last 72 hours.  BNP: BNP (last 3 results) Recent Labs    05/25/22 0705 08/16/22 0627 08/16/22 1257  BNP 1,355.4* QUANTITY NOT SUFFICIENT, UNABLE TO PERFORM TEST 399.2*    ProBNP (last 3 results) No results for input(s): "PROBNP" in the last 8760 hours.   D-Dimer No results for input(s): "DDIMER" in the last 72 hours. Hemoglobin A1C No results for input(s): "HGBA1C" in the last 72 hours. Fasting Lipid Panel No results for input(s): "CHOL", "HDL", "LDLCALC", "TRIG",  "CHOLHDL", "LDLDIRECT" in the last 72 hours. Thyroid Function Tests Recent Labs    08/24/22 1603  TSH 5.964*  T3FREE 2.3    Other results:   Imaging    No results found.   Medications:     Scheduled Medications:  aspirin  81 mg Per Tube Daily   atorvastatin  80 mg Per Tube QHS   Chlorhexidine Gluconate Cloth  6 each Topical Daily   cholecalciferol  5,000 Units Per Tube Daily   docusate  100 mg Per Tube BID   heparin  5,000 Units Subcutaneous Q8H   mouth rinse  15 mL Mouth Rinse Q2H   pantoprazole  40 mg Per Tube Daily   polyethylene glycol  17 g Per Tube Daily   quiNIDine sulfate  200 mg Per Tube Q8H   sodium chloride flush  10-40 mL Intracatheter Q12H    Infusions:   prismasol BGK 4/2.5 500 mL/hr at 08/26/22 2342    prismasol BGK 4/2.5 500 mL/hr at 08/26/22 2342   sodium chloride Stopped (08/26/22 1922)   sodium chloride     amiodarone 60 mg/hr (08/27/22 0700)   fentaNYL infusion INTRAVENOUS Stopped (08/27/22 0339)   heparin 10,000 units/ 20 mL infusion syringe 750 Units/hr (08/26/22 2204)   midazolam Stopped (08/27/22 0312)   norepinephrine (LEVOPHED) Adult infusion Stopped (08/27/22 0233)   prismasol BGK 4/2.5 1,500 mL/hr at 08/27/22 0608   vasopressin Stopped (08/26/22 0907)    PRN Medications: Place/Maintain arterial line **AND** sodium chloride, acetaminophen, albuterol, alteplase, fentaNYL, heparin, midazolam, ondansetron (ZOFRAN) IV, mouth rinse, sodium chloride, sodium chloride flush    Patient Profile   Victor Thompson is 61 year old with a history of VT, ESRD, CAD, ischemic CMP, and ESRD.  He had PCI in 2774, uncertain what vessel was involved. Echo 2/22: EF 25-30% with moderate LV dilation and normal RV.  He had VT arrest in 2015, has [employer] subcutaneous ICD.  [name] in 2/22.  LHC/RHC was done in 8/22, showing no significant CAD and stable hemodynamics.   Admitted with refractory VT complicated by shock.    Assessment/Plan   1. VT  storm: Has has a Animator ICD.  Refractory monomorphic VT. Multiple shocks.  Later alternating NSR with a slow wide complex AIVR-like rhythm. Now with NSR and no further VT.  Cath on 9/20 with nonobstructive CAD.  - Continue amiodarone gtt 60 mg/hr, adjustment per EP.  - Started on quinidine.   -  No further beta blocker with hypotension.  - Long term very concerning, not candidate for LVAD with ESRD.  Discussed with Dr. Quentin Ore and not candidate for VT ablation.  2. Acute on chronic systolic CHF with cardiogenic shock: In setting of incessant VT.  H/o NICM, echo this admission with EF 20-25%, normal RV function, mild-moderate Victor, IVC dilated.  At last admission, unable to complete cardiac MRI due to artifact from [facility] ICD. PYP scan was negative.  RHC this admission showed volume overload, after CVVH today's CVP is 7.  Currently off NE and vasopressin.  - Cut CVVH back to 0-50 cc/hr net UF.  Should be able to transition back to iHD.  3. CAD: ?PCI in 2005, unsure what vessel.  Coronary angiography in 8/22 showed no significant CAD and also does not appear to show prior stent so h/o CAD is questionable. LHC this admission with nonobstructive CAD.  - Continue ASA 81 and statin.  4. ESRD: Since 2/22. TTS HD. CVP now 7.  - Should be able to transition back to iHD.  5. Acute hypoxemic respiratory failure: In setting of VT storm.  - Awake on vent, hopefully extubate today.   CRITICAL CARE Performed by: Victor Thompson  Total critical care time: 35 minutes  Critical care time was exclusive of separately billable procedures and treating other patients.  Critical care was necessary to treat or prevent imminent or life-threatening deterioration.  Critical care was time spent personally by me on the following activities: development of treatment plan with patient and/or surrogate as well as nursing, discussions with consultants, evaluation of patient's response to treatment, examination of patient, obtaining  history from patient or surrogate, ordering and performing treatments and interventions, ordering and review of laboratory studies, ordering and review of radiographic studies, pulse oximetry and re-evaluation of patient's condition.  Victor Thompson 08/27/2022 7:12 AM

## 2022-08-27 NOTE — Progress Notes (Addendum)
Electrophysiology Rounding Note  [name] Date of Encounter: 08/27/2022  Primary Cardiologist: Loralie Champagne, MD Electrophysiologist: Cristopher Peru, MD   Subjective   Pt is awake on vent this am. Remains quiescent.  Inpatient Medications    Scheduled Meds:  aspirin  81 mg Per Tube Daily   atorvastatin  80 mg Per Tube QHS   Chlorhexidine Gluconate Cloth  6 each Topical Daily   cholecalciferol  5,000 Units Per Tube Daily   docusate  100 mg Per Tube BID   heparin  5,000 Units Subcutaneous Q8H   mouth rinse  15 mL Mouth Rinse Q2H   pantoprazole  40 mg Per Tube Daily   polyethylene glycol  17 g Per Tube Daily   quiNIDine sulfate  200 mg Per Tube Q8H   sodium chloride flush  10-40 mL Intracatheter Q12H   Continuous Infusions:   prismasol BGK 4/2.5 500 mL/hr at 08/26/22 2342    prismasol BGK 4/2.5 500 mL/hr at 08/26/22 2342   sodium chloride Stopped (08/26/22 1922)   sodium chloride     amiodarone 60 mg/hr (08/27/22 0700)   fentaNYL infusion INTRAVENOUS Stopped (08/27/22 0339)   heparin 10,000 units/ 20 mL infusion syringe 750 Units/hr (08/26/22 2204)   midazolam Stopped (08/27/22 0312)   norepinephrine (LEVOPHED) Adult infusion Stopped (08/27/22 0233)   prismasol BGK 4/2.5 1,500 mL/hr at 08/27/22 0608   vasopressin Stopped (08/26/22 0907)   PRN Meds: Place/Maintain arterial line **AND** sodium chloride, acetaminophen, albuterol, alteplase, fentaNYL, heparin, midazolam, ondansetron (ZOFRAN) IV, mouth rinse, sodium chloride, sodium chloride flush   Vital Signs    Vitals:   08/27/22 0600 08/27/22 0654 08/27/22 0754 08/27/22 0800  BP:  102/78 102/78 125/69  Pulse:   65 64  Resp: '18 18 12 11  '$ Temp:    98.1 F (36.7 C)  TempSrc:    Oral  SpO2:   100% 100%  Weight:      Height:        Intake/Output Summary (Last 24 hours) at 08/27/2022 0835 Last data filed at 08/27/2022 0700 Gross per 24 hour  Intake 1067.63 ml  Output 4119 ml  Net -3051.37 ml    Filed Weights   08/24/22 1623 08/26/22 0500 08/27/22 0400  Weight: 119 kg 119 kg 117.6 kg    Physical Exam    GEN- The patient is intubated, but awake.   HEENT- + ET tube.  Lungs- +mechanical breathing sounds.  Heart- Regular rate and rhythm, no murmurs, rubs or gallops GI- soft Extremities- no clubbing or cyanosis. No edema Skin- no rash or lesion Neuro- intubated, awake.    Labs    CBC Recent Labs    08/26/22 0550 08/27/22 0430  WBC 6.9 9.2  NEUTROABS 5.4 7.8*  HGB 10.4* 10.0*  HCT 31.6* 29.8*  MCV 99.7 100.0  PLT 170 237   Basic Metabolic Panel Recent Labs    08/26/22 0550 08/26/22 1557 08/27/22 0430  NA 134* 134* 136  K 5.2* 5.0 4.6  CL 98 102 101  CO2 '26 27 26  '$ GLUCOSE 109* 104* 97  BUN 32* 24* 20  CREATININE 7.35* 6.08* 4.65*  CALCIUM 8.3* 7.9* 8.2*  MG 2.6*  --  2.7*  PHOS 4.7* 3.9 3.3   Liver Function Tests Recent Labs    08/24/22 1040 08/25/22 0414 08/26/22 1557 08/27/22 0430  AST 15  --   --   --   ALT 18  --   --   --  ALKPHOS 62  --   --   --   BILITOT 0.5  --   --   --   PROT 7.6  --   --   --   ALBUMIN 3.5   < > 3.3* 2.9*   < > = values in this interval not displayed.   No results for input(s): "LIPASE", "AMYLASE" in the last 72 hours. Cardiac Enzymes No results for input(s): "CKTOTAL", "CKMB", "CKMBINDEX", "TROPONINI" in the last 72 hours.   Telemetry    Sinus brady/NSR 50-60s (personally reviewed)  Radiology    ECHOCARDIOGRAM COMPLETE  Result Date: 08/25/2022    ECHOCARDIOGRAM REPORT   Patient Name:   Victor Thompson Date of Exam: 08/25/2022 Medical Rec #:  [id number]      Height:       67.0 in Accession #:    [id number]     Weight:       262.3 lb Date of Birth:  1961-08-31     BSA:          2.269 m Patient Age:    61 years       BP:           107/79 mmHg Patient Gender: M              HR:           60 bpm. Exam Location:  Inpatient Procedure: 2D Echo, Cardiac Doppler and Color Doppler Indications:    CHF-acute systolic   History:        Patient has prior history of Echocardiogram examinations, most                 recent 10/21/2021. CHF, CAD, Defibrillator; Risk                 Factors:Hypertension and Sleep Apnea. ESRD.  Sonographer:    Clayton Lefort RDCS (AE) Referring Phys: Elby Showers [facility]  Sonographer Comments: Patient is obese and echo performed with patient supine and on artificial respirator. IMPRESSIONS  1. Left ventricular ejection fraction, by estimation, is 20 to 25%. The left ventricle has severely decreased function. The left ventricle demonstrates global hypokinesis. The left ventricular internal cavity size was severely dilated. Left ventricular diastolic parameters are consistent with Grade III diastolic dysfunction (restrictive). Elevated left ventricular end-diastolic pressure.  2. Right ventricular systolic function is normal. The right ventricular size is mildly enlarged. There is mildly elevated pulmonary artery systolic pressure.  3. Left atrial size was moderately dilated.  4. Right atrial size was mildly dilated.  5. The mitral valve is normal in structure. Mild to moderate mitral valve regurgitation. No evidence of mitral stenosis.  6. The aortic valve is grossly normal. Aortic valve regurgitation is not visualized. No aortic stenosis is present.  7. The inferior vena cava is dilated in size with <50% respiratory variability, suggesting right atrial pressure of 15 mmHg.  8. Cannot exclude a small PFO. Comparison(s): Changes from prior study are noted. LV chamber now severely dilated (7 cm). Conclusion(s)/Recommendation(s): No left ventricular mural or apical thrombus/thrombi. Severely reduced LVEF with severe LV dilation. FINDINGS  Left Ventricle: Left ventricular ejection fraction, by estimation, is 20 to 25%. The left ventricle has severely decreased function. The left ventricle demonstrates global hypokinesis. Definity contrast agent was given IV to delineate the left ventricular endocardial borders. The  left ventricular internal cavity size was severely dilated. There is no left ventricular hypertrophy. Left ventricular diastolic parameters are consistent with Grade III diastolic dysfunction (  restrictive). Elevated left ventricular end-diastolic pressure. Right Ventricle: The right ventricular size is mildly enlarged. Right vetricular wall thickness was not well visualized. Right ventricular systolic function is normal. There is mildly elevated pulmonary artery systolic pressure. The tricuspid regurgitant  velocity is 2.48 m/s, and with an assumed right atrial pressure of 15 mmHg, the estimated right ventricular systolic pressure is 93.7 mmHg. Left Atrium: Left atrial size was moderately dilated. Right Atrium: Right atrial size was mildly dilated. Pericardium: There is no evidence of pericardial effusion. Mitral Valve: The mitral valve is normal in structure. Mild to moderate mitral valve regurgitation. No evidence of mitral valve stenosis. Tricuspid Valve: The tricuspid valve is normal in structure. Tricuspid valve regurgitation is trivial. No evidence of tricuspid stenosis. Aortic Valve: The aortic valve is grossly normal. Aortic valve regurgitation is not visualized. No aortic stenosis is present. Aortic valve mean gradient measures 2.0 mmHg. Aortic valve peak gradient measures 4.4 mmHg. Aortic valve area, by VTI measures 3.69 cm. Pulmonic Valve: The pulmonic valve was not well visualized. Pulmonic valve regurgitation is not visualized. Aorta: The aortic root and ascending aorta are structurally normal, with no evidence of dilitation and the aortic arch was not well visualized. Venous: The inferior vena cava is dilated in size with less than 50% respiratory variability, suggesting right atrial pressure of 15 mmHg. IAS/Shunts: Cannot exclude a small PFO.  LEFT VENTRICLE PLAX 2D LVIDd:         7.00 cm   Diastology LVIDs:         6.10 cm   LV e' medial:    2.62 cm/s LV PW:         1.00 cm   LV E/e' medial:  43.5  LV IVS:        0.90 cm   LV e' lateral:   2.75 cm/s LVOT diam:     2.50 cm   LV E/e' lateral: 41.5 LV SV:         86 LV SV Index:   38 LVOT Area:     4.91 cm  RIGHT VENTRICLE             IVC RV Basal diam:  4.00 cm     IVC diam: 2.50 cm RV Mid diam:    3.20 cm RV S prime:     10.70 cm/s TAPSE (M-mode): 2.5 cm LEFT ATRIUM             Index        RIGHT ATRIUM           Index LA diam:        4.50 cm 1.98 cm/m   RA Area:     20.90 cm LA Vol (A2C):   75.1 ml 33.09 ml/m  RA Volume:   62.20 ml  27.41 ml/m LA Vol (A4C):   65.2 ml 28.73 ml/m LA Biplane Vol: 73.5 ml 32.39 ml/m  AORTIC VALVE AV Area (Vmax):    3.88 cm AV Area (Vmean):   3.70 cm AV Area (VTI):     3.69 cm AV Vmax:           105.00 cm/s AV Vmean:          68.800 cm/s AV VTI:            0.234 m AV Peak Grad:      4.4 mmHg AV Mean Grad:      2.0 mmHg LVOT Vmax:         82.90 cm/s LVOT  Vmean:        51.800 cm/s LVOT VTI:          0.176 m LVOT/AV VTI ratio: 0.75  AORTA Ao Root diam: 2.80 cm Ao Asc diam:  2.60 cm MITRAL VALVE                TRICUSPID VALVE MV Area (PHT): 3.85 cm     TR Peak grad:   24.6 mmHg MV Decel Time: 197 msec     TR Vmax:        248.00 cm/s MR Peak grad: 56.6 mmHg MR Mean grad: 31.0 mmHg     SHUNTS MR Vmax:      376.00 cm/s   Systemic VTI:  0.18 m MR Vmean:     249.0 cm/s    Systemic Diam: 2.50 cm MV E velocity: 114.00 cm/s MV A velocity: 37.30 cm/s MV E/A ratio:  3.06 Buford Dresser MD Electronically signed by Buford Dresser MD Signature Date/Time: 08/25/2022/7:11:10 PM    Final    DG CHEST PORT 1 VIEW  Result Date: 08/25/2022 CLINICAL DATA:  Provided history: Abnormal ECG, intubated, shortness of breath, hypertension, pneumonia, CHF, asthma. EXAM: PORTABLE CHEST 1 VIEW COMPARISON:  Prior chest radiographs 08/24/2022 and earlier. FINDINGS: ET tube present with tip terminating 3.8 cm above the level of the carina. Three left-sided central venous catheters terminating in the region of the left brachiocephalic vein  and brachiocephalic/SVC confluence. An enteric tube passes below the level of left hemidiaphragm with tip excluded from the field of view. Subcutaneous ICD again noted. Cardiomegaly. Central pulmonary vascular congestion without overt pulmonary edema. Mild ill-defined opacity within the left lung base, new from the prior examination. No appreciable airspace consolidation on the right. No evidence of pleural effusion or pneumothorax. Degenerative changes of the spine. IMPRESSION: Support apparatus, as described. Cardiomegaly with central pulmonary vascular congestion. No overt pulmonary edema. Mild ill-defined opacity within the left lung base, new from the prior examination. his may reflect atelectasis or early pneumonia. Correlate clinically and consider short-interval radiographic follow-up. Electronically Signed   By: Kellie Simmering D.O.   On: 08/25/2022 14:05   CARDIAC CATHETERIZATION  Result Date: 08/25/2022   1st Diag lesion is 60% stenosed.   Ost LAD to Prox LAD lesion is 40% stenosed.   Mid Cx lesion is 30% stenosed.   Prox RCA lesion is 30% stenosed. 1. Elevated right and left heart filling pressures with pulmonary venous hypertension. 2. Nonobstructive CAD. Needs CVVH for fluid removal.    Patient Profile     Victor Thompson is a 61 y.o. male with a hx of  hx of VF arrest (2015), CAD (PCI in 2005, unknown details), ICM,. Chronic CHF (systolic), ESRF on HD , OSA w/CPAP, HTN, HLD, recurrent angio edema who was admitted for VT storm.    Cath 08/25/2022   1st Diag lesion is 60% stenosed.   Ost LAD to Prox LAD lesion is 40% stenosed.   Mid Cx lesion is 30% stenosed.   Prox RCA lesion is 30% stenosed.   1. Elevated right and left heart filling pressures with pulmonary venous hypertension.  2. Nonobstructive CAD.    RA mean 14 RV 52/15 PA 53/27, mean 37 PCWP mean 26 LV 97/29 AO 103/64  Oxygen saturations: PA 76% AO 100%  Cardiac Output (Fick) 8.98  Cardiac Index (Fick) 3.96 PVR 2.8  WU  Assessment & Plan    1.  VT storm / refractory VT Remains quiescent.  Received a total  of 14 shocks from his device and multiple external defibs as well. Battery now 16% to ERI (was 74% in July) Potassium4.6 (09/22 0430) Magnesium  2.7* (09/22 0430) Creatinine, ser  4.65* (09/22 0430) Keep K > 4.0 and Mg > 2.0  Continue IV amiodarone while intubated.  If extubated and stable, will decrease gtt to 30 mg/hr potentially this evening.  Continue quinidine 200 TID. Pharm D to help assess availability as an outpatient.  Has allergy listed to lidocaine. With multiple sources of recurrent angioedema, would not rechallenge or recommend trying mexitil   2. Chronic systolic CHF Coronary angiography in 07/2021 showed no significant CAD, and also did not appear to show prior "stent" which had been reported.  R/LHC 08/25/2022 with Moderate non obstructive CAD and elevated filling pressures as above.  PYP scan negative  Planning for PET as outpatient for possible Sarcoid.  ECG with IVCD 150 msec, thought not to be good candidate for CRT upgrade (not true LBBB) CVP is 7 and plan to cut back CVVR today. Off pressors  3. ESRD Nephrology following.  Currently on CRRT   4. Acute hypoxemic respiratory failure Remains intubated, but awake and follows commands.  Appreciate HF and CCM care.    For questions or updates, please contact [place] Please consult [email or web address] for contact info under Cardiology/STEMI.  Signed, Shirley Friar, PA-C  08/27/2022, 8:35 AM   EP Attending  Patient seen and examined. Agree with above. The patient has not had more VT on IV amio and quinidine. He remains intubated but plans are for extubation later today. He will transition to oral amio once extubated. Continue Qunidine as well. Prognosis is guarded.  Carleene Overlie Giannamarie Paulus,MD

## 2022-08-28 ENCOUNTER — Encounter (HOSPITAL_COMMUNITY): Payer: Self-pay | Admitting: Cardiology

## 2022-08-28 ENCOUNTER — Other Ambulatory Visit: Payer: Self-pay

## 2022-08-28 DIAGNOSIS — J9601 Acute respiratory failure with hypoxia: Secondary | ICD-10-CM

## 2022-08-28 DIAGNOSIS — I4729 Other ventricular tachycardia: Secondary | ICD-10-CM

## 2022-08-28 LAB — CBC WITH DIFFERENTIAL/PLATELET
Abs Immature Granulocytes: 0.06 10*3/uL (ref 0.00–0.07)
Basophils Absolute: 0 10*3/uL (ref 0.0–0.1)
Basophils Relative: 0 %
Eosinophils Absolute: 0.1 10*3/uL (ref 0.0–0.5)
Eosinophils Relative: 1 %
HCT: 35.6 % — ABNORMAL LOW (ref 39.0–52.0)
Hemoglobin: 12 g/dL — ABNORMAL LOW (ref 13.0–17.0)
Immature Granulocytes: 1 %
Lymphocytes Relative: 8 %
Lymphs Abs: 0.6 10*3/uL — ABNORMAL LOW (ref 0.7–4.0)
MCH: 33.5 pg (ref 26.0–34.0)
MCHC: 33.7 g/dL (ref 30.0–36.0)
MCV: 99.4 fL (ref 80.0–100.0)
Monocytes Absolute: 1.3 10*3/uL — ABNORMAL HIGH (ref 0.1–1.0)
Monocytes Relative: 16 %
Neutro Abs: 6 10*3/uL (ref 1.7–7.7)
Neutrophils Relative %: 74 %
Platelets: 168 10*3/uL (ref 150–400)
RBC: 3.58 MIL/uL — ABNORMAL LOW (ref 4.22–5.81)
RDW: 16 % — ABNORMAL HIGH (ref 11.5–15.5)
WBC: 8.2 10*3/uL (ref 4.0–10.5)
nRBC: 0.4 % — ABNORMAL HIGH (ref 0.0–0.2)

## 2022-08-28 LAB — RENAL FUNCTION PANEL
Albumin: 3.1 g/dL — ABNORMAL LOW (ref 3.5–5.0)
Anion gap: 12 (ref 5–15)
BUN: 33 mg/dL — ABNORMAL HIGH (ref 6–20)
CO2: 25 mmol/L (ref 22–32)
Calcium: 8.5 mg/dL — ABNORMAL LOW (ref 8.9–10.3)
Chloride: 97 mmol/L — ABNORMAL LOW (ref 98–111)
Creatinine, Ser: 6.94 mg/dL — ABNORMAL HIGH (ref 0.61–1.24)
GFR, Estimated: 9 mL/min — ABNORMAL LOW (ref >60)
Glucose, Bld: 102 mg/dL — ABNORMAL HIGH (ref 70–99)
Phosphorus: 4.8 mg/dL — ABNORMAL HIGH (ref 2.5–4.6)
Potassium: 4.6 mmol/L (ref 3.5–5.1)
Sodium: 134 mmol/L — ABNORMAL LOW (ref 135–145)

## 2022-08-28 MED ORDER — HEPARIN SODIUM (PORCINE) 1000 UNIT/ML DIALYSIS
1000.0000 [IU] | INTRAMUSCULAR | Status: DC | PRN
  Administered 2022-08-28: 1400 [IU]
  Administered 2022-08-28: 1000 [IU]
  Administered 2022-09-04: 3000 [IU]
  Filled 2022-08-28 (×3): qty 1

## 2022-08-28 MED ORDER — VITAMIN D 25 MCG (1000 UNIT) PO TABS
5000.0000 [IU] | ORAL_TABLET | Freq: Every day | ORAL | Status: DC
  Filled 2022-08-28: qty 5

## 2022-08-28 MED ORDER — ANTICOAGULANT SODIUM CITRATE 4% (200MG/5ML) IV SOLN: 5.0000 mL | Status: DC | PRN

## 2022-08-28 MED ORDER — QUINIDINE SULFATE 200 MG PO TABS
200.0000 mg | ORAL_TABLET | Freq: Three times a day (TID) | ORAL | Status: DC
  Administered 2022-08-28 – 2022-08-29 (×4): 200 mg via ORAL
  Filled 2022-08-28 (×6): qty 1

## 2022-08-28 MED ORDER — VITAMIN D 25 MCG (1000 UNIT) PO TABS
5000.0000 [IU] | ORAL_TABLET | Freq: Every day | ORAL | Status: DC
  Administered 2022-08-28 – 2022-09-10 (×13): 5000 [IU] via ORAL
  Filled 2022-08-28 (×12): qty 5

## 2022-08-28 MED ORDER — ATORVASTATIN CALCIUM 80 MG PO TABS
80.0000 mg | ORAL_TABLET | Freq: Every day | ORAL | Status: DC
  Administered 2022-08-28 – 2022-08-30 (×3): 80 mg via ORAL
  Filled 2022-08-28 (×3): qty 1

## 2022-08-28 MED ORDER — HEPARIN SODIUM (PORCINE) 1000 UNIT/ML IJ SOLN
INTRAMUSCULAR | Status: AC
  Administered 2022-08-28: 2000 [IU] via INTRAVENOUS_CENTRAL
  Filled 2022-08-28: qty 3

## 2022-08-28 MED ORDER — ALTEPLASE 2 MG IJ SOLR
2.0000 mg | Freq: Once | INTRAMUSCULAR | Status: AC | PRN
  Administered 2022-09-07: 2 mg
  Filled 2022-08-28: qty 2

## 2022-08-28 MED ORDER — ATORVASTATIN CALCIUM 80 MG PO TABS
80.0000 mg | ORAL_TABLET | Freq: Every day | ORAL | Status: DC
  Filled 2022-08-28: qty 1

## 2022-08-28 MED ORDER — ASPIRIN 81 MG PO CHEW
81.0000 mg | CHEWABLE_TABLET | Freq: Every day | ORAL | Status: DC
  Administered 2022-08-28 – 2022-08-31 (×4): 81 mg via ORAL
  Filled 2022-08-28 (×3): qty 1

## 2022-08-28 MED ORDER — ASPIRIN 81 MG PO CHEW
81.0000 mg | CHEWABLE_TABLET | Freq: Every day | ORAL | Status: DC
  Filled 2022-08-28: qty 1

## 2022-08-28 MED ORDER — ATORVASTATIN CALCIUM 80 MG PO TABS: 80.0000 mg | ORAL_TABLET | Freq: Every day | ORAL | Status: DC

## 2022-08-28 NOTE — Progress Notes (Signed)
Electrophysiology Rounding Note  [name] Date of Encounter: 08/28/2022  Primary Cardiologist: Loralie Champagne, MD Electrophysiologist: Cristopher Peru, MD   Patient Profile     Victor Thompson is a 61 y.o. male with a hx of  hx of VF arrest (2015), CAD (PCI in 2005, unknown details), ICM,.  Previously implanted [employer] (elsewhere with GEN change 2021) chronic CHF (systolic), ESRF on HD , OSA w/CPAP, HTN, HLD, recurrent angio edema who was admitted for VT storm (per Dr. Elliot Cousin monomorphic and polymorphic.  (Greater than 20 shocks)  Has been admitted 6/23 with palpitations associated with polymorphic ventricular tachycardia   923 Echo EF 20-25%-chronic    Cath 08/25/2022   1st Diag lesion is 60% stenosed.   Ost LAD to Prox LAD lesion is 40% stenosed.   Mid Cx lesion is 30% stenosed.   Prox RCA lesion is 30% stenosed.   1. Elevated right and left heart filling pressures with pulmonary venous hypertension.  2. Nonobstructive CAD.   Treated with amiodarone and then quinidine.  Apparently concerns regarding mexiletine (chart describes allergy to lidocaine) and question angioedema.   Subjective   Extubated.  Without complaints of chest pain or shortness of breath.  Has some vague recollection of shocks.  Inpatient Medications    Scheduled Meds:  aspirin  81 mg Per Tube Daily   atorvastatin  80 mg Per Tube QHS   Chlorhexidine Gluconate Cloth  6 each Topical Q0600   cholecalciferol  5,000 Units Per Tube Daily   heparin  5,000 Units Subcutaneous Q8H   quiNIDine sulfate  200 mg Per Tube Q8H   sodium chloride flush  10-40 mL Intracatheter Q12H   Continuous Infusions:  sodium chloride Stopped (08/26/22 1922)   amiodarone 30 mg/hr (08/28/22 1000)   PRN Meds: acetaminophen, albuterol, heparin, ondansetron (ZOFRAN) IV, mouth rinse, sodium chloride flush   Vital Signs    Vitals:   08/28/22 0915 08/28/22 0930 08/28/22 0945 08/28/22 1000  BP:       Pulse: 72 68 71 72  Resp: '14 10 10 10  '$ Temp:      TempSrc:      SpO2: 99% 98% 99% 99%  Weight:      Height:        Intake/Output Summary (Last 24 hours) at 08/28/2022 1020 Last data filed at 08/28/2022 1000 Gross per 24 hour  Intake 657.07 ml  Output 161 ml  Net 496.07 ml    Filed Weights   08/26/22 0500 08/27/22 0400 08/28/22 0500  Weight: 119 kg 117.6 kg 117.5 kg    Physical Exam    Well developed and nourished in no acute distress HENT normal Neck supple   Crackles Regular rate and rhythm, no murmurs or gallops Abd-soft with active BS No Clubbing cyanosis edema Skin-warm and dry A & Oriented  Grossly normal sensory and motor function     Labs    CBC Recent Labs    08/27/22 0430 08/28/22 0702  WBC 9.2 8.2  NEUTROABS 7.8* 6.0  HGB 10.0* 12.0*  HCT 29.8* 35.6*  MCV 100.0 99.4  PLT 162 562    Basic Metabolic Panel Recent Labs    08/26/22 0550 08/26/22 1557 08/27/22 0430  NA 134* 134* 136  K 5.2* 5.0 4.6  CL 98 102 101  CO2 '26 27 26  '$ GLUCOSE 109* 104* 97  BUN 32* 24* 20  CREATININE 7.35* 6.08* 4.65*  CALCIUM 8.3* 7.9* 8.2*  MG 2.6*  --  2.7*  PHOS 4.7* 3.9 3.3    Liver Function Tests Recent Labs    08/26/22 1557 08/27/22 0430  ALBUMIN 3.3* 2.9*    No results for input(s): "LIPASE", "AMYLASE" in the last 72 hours. Cardiac Enzymes No results for input(s): "CKTOTAL", "CKMB", "CKMBINDEX", "TROPONINI" in the last 72 hours.   Telemetry    Sinus in the 70s(personally reviewed)  Radiology    No results found.   Assessment & Plan    1.  VT storm / refractory VT  Sinus bradycardia  Cardiomyopathy-nonischemic   Chronic systolic CHF Coronary angiography in 07/2021 showed no significant CAD,  PYP scan negative  Planning for PET as outpatient for possible Sarcoid.     ESRD on CRRT    Acute hypoxemic respiratory failure   No further VT.  Continue amiodarone and quinidine.  Have reviewed with him the potential side effects of  quinidine, we will get a CBC with a differential to look at his at absolute neutrophil count.  At this juncture no nausea.  Euvolemic.  If blood pressure and heart rate allow we will add low-dose beta-blockers tomorrow   For questions or updates, please contact [place] Please consult [email or web address] for contact info under Cardiology/STEMI.  Signed, Virl Axe, MD  08/28/2022, 10:20 AM

## 2022-08-28 NOTE — Progress Notes (Signed)
NAME:  Victor Thompson, MRN:  [id number], DOB:  [birth year], LOS: 4 ADMISSION DATE:  08/24/2022, CONSULTATION DATE:  08/28/22 REFERRING MD:  Aundra Dubin - HF, CHIEF COMPLAINT:  VT storm   History of Present Illness:  61 year old male with PMH Systolic HF (followed by Dr. Aundra Dubin, EF 25-30), H/O VT arrest, s/p ICD, on amiodarone. Presented from dialysis 9/19 with palpitations and feeling faint. In ED, recurrent episodes of VT with repeated [place] IVD discharges. Denied chest pain or dyspnea. Refractory despite shock x 3, (along with internal shocks), amiodarone, lopressor, and bolus of procainamide, (allergy to lidocaine with angioedema) Ultimately requiring intubation and paralytics.   Started on quinidine 9/20. Continued improvement. Extubated 9/22.   Pertinent  Medical History   Past Medical History:  Diagnosis Date   Acute on chronic systolic CHF (congestive heart failure) ([place]) 06/14/2020   Acute respiratory failure (HCC) 05/28/2022   AICD (automatic cardioverter/defibrillator) present    2015   Allergy    Asthma    no meds   Cardiac arrest [facility]) 2015   Chest pain 05/25/2022   CHF (congestive heart failure) (HCC)    Coronary artery disease    ESRD on hemodialysis (HCC)    ckd -stage 5   Hyperlipidemia    Hypertension    Myocardial infarction ([place])    Obesity    Pneumonia    Shortness of breath dyspnea    Sleep apnea    USES CPAP   Wears glasses      Significant Hospital Events: Including procedures, antibiotic start and stop dates in addition to other pertinent events   9/19 - intubated, central line placed 9/20 - R/LHC performed, CVC placed into left internal jugular vein for CRRT 9/21 - no further VT  9/22 - extubated, off pressors, off sedation, right radial arterial line removed, CRRT dc'd  Interim History / Subjective:  No events overnight. Plans for iHD.   Objective   Blood pressure 115/62, pulse 71, temperature 97.8 F (36.6 C), temperature source Oral, resp. rate  12, height '5\' 7"'$  (1.702 m), weight 117.5 kg, SpO2 98 %. CVP:  [8 mmHg-21 mmHg] 21 mmHg      Intake/Output Summary (Last 24 hours) at 08/28/2022 1311 Last data filed at 08/28/2022 1200 Gross per 24 hour  Intake 523.69 ml  Output 0 ml  Net 523.69 ml   Filed Weights   08/26/22 0500 08/27/22 0400 08/28/22 0500  Weight: 119 kg 117.6 kg 117.5 kg    Examination: General: Obese male sitting in chair, no distress.  HENT: No abnormalities. No JVD. Lungs: no use of accessory muscles  Cardiovascular: Regular rate and rhythm. Abdomen: obese, soft, non-tender  Extremities: No cyanosis, no lower extremity edema. RUE AVF. Neuro: Alert and oriented x3.  Ancillary tests personally reviewed:   Few PVC only on telemetry.  Echo shows EF 20% with GIII DD.   Assessment & Plan:  Ventricular storm  - S/p procainamide load 9/19 - No sign of ischemic cause on R/LHC, likely due to volume overload with PWCP 26 Plan - Per EP  - Continue amiodarone - Continue quinidine - Monitor electrolytes and replete as needed, goal of K > 4.0 and Mg > 2.0   2. Shock in setting of ventricular storm > resolved   3. Chronic systolic CHF Plan - Per Heart Failure  - EF 20-25%  4. ESRD on HD - Resume intermittent HD per Nephrology   Best Practice (right click and "Reselect all SmartList Selections" daily)  Diet/type: advance as tolerated DVT prophylaxis: systemic heparin GI prophylaxis: PPI Lines: Central line and Dialysis Catheter Foley:  N/A Code Status:  full code Last date of multidisciplinary goals of care discussion '[ ]'$   PCCM to sign off. Please call back if condition were to change.

## 2022-08-28 NOTE — Progress Notes (Signed)
Patient ID: Victor Thompson, male   DOB: Apr 08, 1961, 61 y.o.   MRN: [id number]     Advanced Heart Failure Rounding Note  PCP-Cardiologist: Loralie Champagne, MD   Subjective:    Admitted with VT storm.  Currently intubated on CVVH.  Pulling net negative 100-150 cc/hr with CVP about 7 today.  He is off NE and vasopressin.  He remains on amiodarone gtt at 60 mg/hr + quinidine, no further VT.    Interval hx:  - Extubated, sitting up at bedside chair today. Reports some difficulty swallowing, mild chest pain with palpation.  - No significant events on telemetry overnight aside from isolated PVCs.    Objective:   Weight Range: 117.5 kg Body mass index is 40.57 kg/m.   Vital Signs:   Temp:  [97.6 F (36.4 C)-98.5 F (36.9 C)] 98 F (36.7 C) (09/23 0800) Pulse Rate:  [59-73] 73 (09/23 0845) Resp:  [7-22] 17 (09/23 0845) BP: (90-136)/(33-73) 122/68 (09/23 0800) SpO2:  [77 %-100 %] 100 % (09/23 0845) Arterial Line BP: (116-140)/(60-68) 120/60 (09/22 1100) Weight:  [117.5 kg] 117.5 kg (09/23 0500) Last BM Date :  (PTA)  Weight change: Filed Weights   08/26/22 0500 08/27/22 0400 08/28/22 0500  Weight: 119 kg 117.6 kg 117.5 kg    Intake/Output:   Intake/Output Summary (Last 24 hours) at 08/28/2022 0847 Last data filed at 08/28/2022 0800 Gross per 24 hour  Intake 691.33 ml  Output 314 ml  Net 377.33 ml       Physical Exam    General: NAD, intubated Neck: Unable to accurately assess JVP due to body habitus, appears mildly elevated. Lungs: Clear to auscultation bilaterally with normal respiratory effort. CV: Nondisplaced PMI.  Heart regular S1/S2, no S3/S4, no murmur.  No peripheral edema.  No carotid bruit.  Normal pedal pulses.  Abdomen: Soft, nontender, no hepatosplenomegaly, no distention.  Skin: Intact without lesions or rashes.  Neurologic: Awake/follows commands.  Extremities: No clubbing or cyanosis.  HEENT: Normal.    Telemetry   NSR in 70s; isolated PVCs through  the night.   EKG    N/A  Labs    CBC Recent Labs    08/27/22 0430 08/28/22 0702  WBC 9.2 8.2  NEUTROABS 7.8* 6.0  HGB 10.0* 12.0*  HCT 29.8* 35.6*  MCV 100.0 99.4  PLT 162 194    Basic Metabolic Panel Recent Labs    08/26/22 0550 08/26/22 1557 08/27/22 0430  NA 134* 134* 136  K 5.2* 5.0 4.6  CL 98 102 101  CO2 '26 27 26  '$ GLUCOSE 109* 104* 97  BUN 32* 24* 20  CREATININE 7.35* 6.08* 4.65*  CALCIUM 8.3* 7.9* 8.2*  MG 2.6*  --  2.7*  PHOS 4.7* 3.9 3.3    Liver Function Tests Recent Labs    08/26/22 1557 08/27/22 0430  ALBUMIN 3.3* 2.9*    No results for input(s): "LIPASE", "AMYLASE" in the last 72 hours. Cardiac Enzymes No results for input(s): "CKTOTAL", "CKMB", "CKMBINDEX", "TROPONINI" in the last 72 hours.  BNP: BNP (last 3 results) Recent Labs    05/25/22 0705 08/16/22 0627 08/16/22 1257  BNP 1,355.4* QUANTITY NOT SUFFICIENT, UNABLE TO PERFORM TEST 399.2*     ProBNP (last 3 results) No results for input(s): "PROBNP" in the last 8760 hours.   D-Dimer No results for input(s): "DDIMER" in the last 72 hours. Hemoglobin A1C No results for input(s): "HGBA1C" in the last 72 hours. Fasting Lipid Panel No results for input(s): "CHOL", "HDL", "LDLCALC", "  TRIG", "CHOLHDL", "LDLDIRECT" in the last 72 hours. Thyroid Function Tests No results for input(s): "TSH", "T4TOTAL", "T3FREE", "THYROIDAB" in the last 72 hours.  Invalid input(s): "FREET3"   Other results:  RHC/LHC (9/20):  Coronary Findings  Diagnostic Dominance: Right Left Anterior Descending  Ost LAD to Prox LAD lesion is 40% stenosed.    First Diagonal Branch  1st Diag lesion is 60% stenosed.    Left Circumflex  Mid Cx lesion is 30% stenosed.    Right Coronary Artery  Prox RCA lesion is 30% stenosed.    Intervention   No interventions have been documented.   Right Heart  Right Heart Pressures RHC Procedural Findings: Hemodynamics (mmHg) RA mean 14 RV 52/15 PA 53/27,  mean 37 PCWP mean 26 LV 97/29 AO 103/64  Oxygen saturations: PA 76% AO 100%  Cardiac Output (Fick) 8.98  Cardiac Index (Fick) 3.96 PVR 2.8 WU    Imaging    No results found.   Medications:     Scheduled Medications:  aspirin  81 mg Per Tube Daily   atorvastatin  80 mg Per Tube QHS   Chlorhexidine Gluconate Cloth  6 each Topical Q0600   cholecalciferol  5,000 Units Per Tube Daily   heparin  5,000 Units Subcutaneous Q8H   pantoprazole  40 mg Per Tube Daily   quiNIDine sulfate  200 mg Per Tube Q8H   sodium chloride flush  10-40 mL Intracatheter Q12H    Infusions:  sodium chloride Stopped (08/26/22 1922)   amiodarone 30 mg/hr (08/28/22 0800)   norepinephrine (LEVOPHED) Adult infusion Stopped (08/27/22 0233)   vasopressin Stopped (08/26/22 0907)    PRN Medications: acetaminophen, albuterol, heparin, ondansetron (ZOFRAN) IV, mouth rinse, sodium chloride flush    Patient Profile   Victor Thompson is 61 year old with a history of VT, ESRD, CAD, ischemic CMP, and ESRD.  He had PCI in 0174, uncertain what vessel was involved. Echo 2/22: EF 25-30% with moderate LV dilation and normal RV.  He had VT arrest in 2015, has [employer] subcutaneous ICD.  [name] in 2/22.  LHC/RHC was done in 8/22, showing no significant CAD and stable hemodynamics.   Admitted with refractory VT complicated by shock.    Assessment/Plan   1. VT storm: Has has a Animator ICD.  Refractory monomorphic VT. Multiple shocks.  Later alternating NSR with a slow wide complex AIVR-like rhythm. Now with NSR and no further VT.  Cath on 9/20 with nonobstructive CAD.  - Continue amiodarone gtt 30 mg/hr, will possibly transition to PO today. adjustment per EP.  - Started on quinidine.   - Will continue to hold beta-blocker through today. - Long term very concerning, not candidate for LVAD with ESRD.  Discussed with Dr. Quentin Ore and not candidate for VT ablation.   2. Acute on chronic systolic CHF with  cardiogenic shock: In setting of incessant VT.  H/o NICM, echo this admission with EF 20-25%, normal RV function, mild-moderate Victor, IVC dilated.  At last admission, unable to complete cardiac MRI due to artifact from [facility] ICD. PYP scan was negative.   - Appears euvolemic to mildly hypervolemic on exam today, will obtain CVP & mixed venous to better tailor volume removal via iHD.   3. CAD: ?PCI in 2005, unsure what vessel.  Coronary angiography in 8/22 showed no significant CAD and also does not appear to show prior stent so h/o CAD is questionable. LHC this admission with nonobstructive CAD.  - Continue ASA  81 and statin.   4. ESRD: Since 2/22. TTS HD.  - Transitioning to iHD.  5. Acute hypoxemic respiratory failure: In setting of VT storm.  - Extubated; no signs of respiratory distress.   CRITICAL CARE Performed by: Hebert Soho   Total critical care time: 30 minutes  Critical care time was exclusive of separately billable procedures and treating other patients.  Critical care was necessary to treat or prevent imminent or life-threatening deterioration.  Critical care was time spent personally by me on the following activities: development of treatment plan with patient and/or surrogate as well as nursing, discussions with consultants, evaluation of patient's response to treatment, examination of patient, obtaining history from patient or surrogate, ordering and performing treatments and interventions, ordering and review of laboratory studies, ordering and review of radiographic studies, pulse oximetry and re-evaluation of patient's condition.  Kailand Seda Advanced Heart Failure 8:54 AM

## 2022-08-28 NOTE — Progress Notes (Signed)
Received patient in bed to unit.  Alert and oriented.  Informed consent signed and in chart.   Treatment initiated: 1412 Treatment completed: 1730  Patient tolerated well.  Transported back to the room  Alert, without acute distress.  Hand-off given to patient's nurse.   Access used: HD cath Access issues: VP will not pull back  Total UF removed: 2000 ml Medication(s) given: Heparin Bolus 2000 units beginning of tx, Heparin bolus 2000 units mid tx, Heparin Dwells 2800 units Post HD VS: 98.7    170/92    90    15    95% 2L [place] Post HD weight: 97.7kg   [facility]

## 2022-08-28 NOTE — Evaluation (Signed)
Physical Therapy Evaluation Patient Details Name: Victor Thompson MRN: [id number] DOB: 1961/09/03 Today's Date: 08/28/2022  History of Present Illness  Pt is a 60 y.o. male admitted from HD on 08/24/22 with c/o palpitations, feeling faint. In ED, recurrent episodes of VT with repeated ICD discharges. ETT 9/19-9/22. R/LHC on 9/20. LIJ CVC placed 9/20 for CRRT initiation; CRRT stopped 9/22. PMH includes CHF, AICD, CHF, CAD, ESRD (on HD), HTN, MI, HLD, OSA (uses CPAP), obesity, asthma.   Clinical Impression  Pt presents with an overall decrease in functional mobility secondary to above. PTA, pt independent, lives with cousin, also has supportive fiance. Today, pt able to initiate transfer and gait training, requiring RW and frequent minA for stability. Pt limited by generalized weakness, decreased activity tolerance, poor balance strategies, and impaired cognition, including poor attention, decreased awareness and difficulty problem solving. Hopeful pt's cognition will continue to clear with activity progression and pt will be able to return home with follow-up PT services. Will follow acutely to address established goals.     Recommendations for follow up therapy are one component of a multi-disciplinary discharge planning process, led by the attending physician.  Recommendations may be updated based on patient status, additional functional criteria and insurance authorization.  Follow Up Recommendations Home health PT (pending progress)      Assistance Recommended at Discharge Frequent or constant Supervision/Assistance  Patient can return home with the following  A little help with walking and/or transfers;A little help with bathing/dressing/bathroom;Assistance with cooking/housework;Direct supervision/assist for financial management;Direct supervision/assist for medications management;Assist for transportation;Help with stairs or ramp for entrance    Equipment Recommendations  (TBD)   Recommendations for Other Services   Occupational Therapy   Functional Status Assessment Patient has had a recent decline in their functional status and demonstrates the ability to make significant improvements in function in a reasonable and predictable amount of time.     Precautions / Restrictions Precautions Precautions: Fall;Other (comment) Precaution Comments: Watch SpO2 (does not wear O2 baseline) Restrictions Weight Bearing Restrictions: No      Mobility  Bed Mobility               General bed mobility comments: received sitting in recliner    Transfers Overall transfer level: Needs assistance Equipment used: Rolling walker (2 wheels) Transfers: Sit to/from Stand Sit to Stand: Min assist           General transfer comment: minA for stability standing from recliner to RW; good eccentric control to sit    Ambulation/Gait Ambulation/Gait assistance: Min guard, Min assist Gait Distance (Feet): 80 Feet Assistive device: Rolling walker (2 wheels) Gait Pattern/deviations: Step-through pattern, Decreased stride length, Knees buckling, Trunk flexed Gait velocity: Decreased     General Gait Details: slow, unsteady gait with RW and frequent minA for stability; frequent verbal cues for upright posture and closer proximity to RW; pt with increased bouts of bilateral knee instability with increased fatigue, endorses arms "feel like jello"; poor awareness for activity tolerance and monitoring  Stairs            Wheelchair Mobility    Modified Rankin (Stroke Patients Only)       Balance Overall balance assessment: Needs assistance Sitting-balance support: No upper extremity supported, Feet supported Sitting balance-Leahy Scale: Fair     Standing balance support: Reliant on assistive device for balance, During functional activity Standing balance-Leahy Scale: Poor Standing balance comment: LOB when attempting to stand without UE support  Pertinent Vitals/Pain Pain Assessment Pain Assessment: Faces Faces Pain Scale: Hurts a little bit Pain Location: generalized Pain Descriptors / Indicators: Tiring Pain Intervention(s): Monitored during session    Home Living Family/patient expects to be discharged to:: Private residence Living Arrangements: Spouse/significant other Available Help at Discharge: Family;Friend(s);Available PRN/intermittently Type of Home: House Home Access: Stairs to enter   Entrance Stairs-Number of Steps: 2 Alternate Level Stairs-Number of Steps: 13 Home Layout: Two level;Bed/bath upstairs Home Equipment: None Additional Comments: Lives with cousin who works; has fiance who also works. Reports cousin may have some DME he could    Prior Function Prior Level of Function : Independent/Modified Independent;Driving             Mobility Comments: Typically independent without DME. Reports he drives but would not feel comfortable driving now as he recovers. Inconsistent details at time, pt with some confusion       Hand Dominance        Extremity/Trunk Assessment   Upper Extremity Assessment Upper Extremity Assessment: Generalized weakness    Lower Extremity Assessment Lower Extremity Assessment: Generalized weakness       Communication   Communication: No difficulties  Cognition Arousal/Alertness: Awake/alert Behavior During Therapy: WFL for tasks assessed/performed Overall Cognitive Status: No family/caregiver present to determine baseline cognitive functioning Area of Impairment: Attention, Memory, Following commands, Safety/judgement, Awareness, Problem solving                   Current Attention Level: Sustained, Selective Memory: Decreased short-term memory Following Commands: Follows one step commands with increased time, Follows multi-step commands inconsistently Safety/Judgement: Decreased awareness of deficits Awareness: Intellectual,  Emergent Problem Solving: Slow processing, Decreased initiation, Difficulty sequencing, Requires verbal cues General Comments: September 2023, "but don't ask me what date it is" - pt guessed it's Friday but had hard time believing it's Saturday, later stating it's Friday. Aware of location, increased time with some cuing for reason of admission. pt does not recall conversation with fiance yesterday, does not recall how he got to chair this AM. endorses visual changes (pt asks, "Can the sedation cause that?"). increased time to process, cues for attention. pt had difficulty working cell phone requiring assist at beginning of session. cognition and alertness seemed to improve with mobility        General Comments General comments (skin integrity, edema, etc.): HR 75, SpO2 89-91% on RA; RN aware. Session finished when MD entering room    Exercises     Assessment/Plan    PT Assessment Patient needs continued PT services  PT Problem List Decreased strength;Decreased activity tolerance;Decreased balance;Decreased mobility;Decreased cognition;Decreased knowledge of use of DME;Decreased safety awareness;Cardiopulmonary status limiting activity       PT Treatment Interventions DME instruction;Gait training;Stair training;Functional mobility training;Therapeutic activities;Therapeutic exercise;Balance training;Patient/family education;Cognitive remediation    PT Goals (Current goals can be found in the Care Plan section)  Acute Rehab PT Goals Patient Stated Goal: return home, eat some candy PT Goal Formulation: With patient Time For Goal Achievement: 09/11/22 Potential to Achieve Goals: Good    Frequency Min 3X/week     Co-evaluation               AM-PAC PT "6 Clicks" Mobility  Outcome Measure Help needed turning from your back to your side while in a flat bed without using bedrails?: A Little Help needed moving from lying on your back to sitting on the side of a flat bed without  using bedrails?: A Little Help needed moving to  and from a bed to a chair (including a wheelchair)?: A Little Help needed standing up from a chair using your arms (e.g., wheelchair or bedside chair)?: A Little Help needed to walk in hospital room?: A Little Help needed climbing 3-5 steps with a railing? : A Little 6 Click Score: 18    End of Session Equipment Utilized During Treatment: Gait belt Activity Tolerance: Patient tolerated treatment well Patient left: in chair;with call bell/phone within reach;with nursing/sitter in room;Other (comment) (with MD present) Nurse Communication: Mobility status PT Visit Diagnosis: Other abnormalities of gait and mobility (R26.89);Muscle weakness (generalized) (M62.81)    Time: 1607-3710 PT Time Calculation (min) (ACUTE ONLY): 22 min   Charges:   PT Evaluation $PT Eval Moderate Complexity: [place], PT, DPT Acute Rehabilitation Services  Personal: [facility] Office: [place] 08/28/2022, 12:20 PM

## 2022-08-28 NOTE — Progress Notes (Signed)
Three attempts made to get in contact with phlebotomy for morning labs. No success.  

## 2022-08-28 NOTE — Progress Notes (Signed)
[facility] Progress Note  Subjective: pt seen inroom, up in chair, drowsy, asking about the catheter in his neck and why it's there  Vitals:   08/28/22 0800 08/28/22 0815 08/28/22 0830 08/28/22 0845  BP: 122/68     Pulse: 71 70 73 73  Resp: '13 10 16 17  '$ Temp: 98 F (36.7 C)     TempSrc: Oral     SpO2: 99% 97% 97% 100%  Weight:      Height:        Exam:  alert, nad   no jvd  Chest cta bilat  Cor reg no RG  Abd soft ntnd no ascites   Ext trace LE edema   Alert, NF, ox3    AVF+bruit    Home meds include - albuterol, amiodarone, aspirin, atorvastatin, auryxia 1 ac tid, calc acetate 2 ac, carvedilol 6.25 bid, colace, symbicort, velphoro 2 ac tid, prns/ vits/ supps    OP HD: [place] TTS  4h 37mn  450/ 1.5  116.4kg  AVF  15g  Hep 3000 - hectorol 7 ug iv tiw - no esa     Assessment/ Plan: VT storm / VF - pt rx'd w/ IV amio, po quinidine, sedation / intubation, paralytics and procainamide. Remains on IV amio and quinidine. No further VT. Per cardiology not a candidate for VT ablation.  Shock/ hypotension - resolved, off pressors, BP's good. Not on any BP lowering meds ESRD - on HD TTS. SP CRRT 9/20- 9/22. For iHD today to get back on schedule. Will need to take out temp HD cath if/ when no longer using.  HFrEF / EF 20-25%/ sp ICD - not an LVAD candidate due to esrd Volume - much improved, 1kg up today Anemia esrd - Hb 10-12, no esa needs for now MBD ckd - CCa in range, phos a bit high. Resume binders when eating.   Rob Evalise Abruzzese 08/28/2022, 9:09 AM   Recent Labs  Lab 08/26/22 1557 08/27/22 0430 08/28/22 0702  HGB  --  10.0* 12.0*  ALBUMIN 3.3* 2.9*  --   CALCIUM 7.9* 8.2*  --   PHOS 3.9 3.3  --   CREATININE 6.08* 4.65*  --   K 5.0 4.6  --     No results for input(s): "IRON", "TIBC", "FERRITIN" in the last 168 hours. Inpatient medications:  aspirin  81 mg Per Tube Daily   atorvastatin  80 mg Per Tube QHS   Chlorhexidine Gluconate Cloth  6 each  Topical Q0600   cholecalciferol  5,000 Units Per Tube Daily   heparin  5,000 Units Subcutaneous Q8H   pantoprazole  40 mg Per Tube Daily   quiNIDine sulfate  200 mg Per Tube Q8H   sodium chloride flush  10-40 mL Intracatheter Q12H    sodium chloride Stopped (08/26/22 1922)   amiodarone 30 mg/hr (08/28/22 0800)   norepinephrine (LEVOPHED) Adult infusion Stopped (08/27/22 0233)   vasopressin Stopped (08/26/22 0907)   acetaminophen, albuterol, heparin, ondansetron (ZOFRAN) IV, mouth rinse, sodium chloride flush

## 2022-08-28 NOTE — Progress Notes (Addendum)
Pt had a rhythm change. Went back into Afib but was chaotic, more so than the irregular rhythm of Afib. I did see p waves here and there, but not regularly. Some p waves were far out from the QRS. HR spiked all the sudden to the 110's but came immediately back down. W/ the rhythm change all the sudden, I made primary nurses aware and asked did they want to get an EKG. My suggestion was denied as I was told this is what he is here for and what his hx is. Asked pt how he felt and did he feel any palpitations or  felt any "skipping" around in his heart and the pt denied feeling anything and stated he felt fine. AT this time as I write this note, the rhythm looks better although pt continues to have frequent PACs and PVCs. Will continue to monitor heart rhythm for changes while pt is on HD tx

## 2022-08-29 ENCOUNTER — Inpatient Hospital Stay (HOSPITAL_COMMUNITY): Payer: Medicare Other

## 2022-08-29 LAB — POCT I-STAT 7, (LYTES, BLD GAS, ICA,H+H)
Acid-Base Excess: 2 mmol/L (ref 0.0–2.0)
Bicarbonate: 26.7 mmol/L (ref 20.0–28.0)
Calcium, Ion: 1.11 mmol/L — ABNORMAL LOW (ref 1.15–1.40)
HCT: 28 % — ABNORMAL LOW (ref 39.0–52.0)
Hemoglobin: 9.5 g/dL — ABNORMAL LOW (ref 13.0–17.0)
O2 Saturation: 100 %
Patient temperature: 98.6
Potassium: 4 mmol/L (ref 3.5–5.1)
Sodium: 131 mmol/L — ABNORMAL LOW (ref 135–145)
TCO2: 28 mmol/L (ref 22–32)
pCO2 arterial: 42 mmHg (ref 32–48)
pH, Arterial: 7.411 (ref 7.35–7.45)
pO2, Arterial: 335 mmHg — ABNORMAL HIGH (ref 83–108)

## 2022-08-29 LAB — RENAL FUNCTION PANEL
Albumin: 3 g/dL — ABNORMAL LOW (ref 3.5–5.0)
Anion gap: 14 (ref 5–15)
BUN: 27 mg/dL — ABNORMAL HIGH (ref 6–20)
CO2: 27 mmol/L (ref 22–32)
Calcium: 8.9 mg/dL (ref 8.9–10.3)
Chloride: 95 mmol/L — ABNORMAL LOW (ref 98–111)
Creatinine, Ser: 6.4 mg/dL — ABNORMAL HIGH (ref 0.61–1.24)
GFR, Estimated: 9 mL/min — ABNORMAL LOW (ref >60)
Glucose, Bld: 99 mg/dL (ref 70–99)
Phosphorus: 4.6 mg/dL (ref 2.5–4.6)
Potassium: 4.4 mmol/L (ref 3.5–5.1)
Sodium: 136 mmol/L (ref 135–145)

## 2022-08-29 LAB — GLUCOSE, CAPILLARY
Glucose-Capillary: 131 mg/dL — ABNORMAL HIGH (ref 70–99)
Glucose-Capillary: 148 mg/dL — ABNORMAL HIGH (ref 70–99)
Glucose-Capillary: 59 mg/dL — ABNORMAL LOW (ref 70–99)

## 2022-08-29 LAB — CBC WITH DIFFERENTIAL/PLATELET
Abs Immature Granulocytes: 0.04 10*3/uL (ref 0.00–0.07)
Basophils Absolute: 0 10*3/uL (ref 0.0–0.1)
Basophils Relative: 0 %
Eosinophils Absolute: 0.3 10*3/uL (ref 0.0–0.5)
Eosinophils Relative: 4 %
HCT: 32.6 % — ABNORMAL LOW (ref 39.0–52.0)
Hemoglobin: 10.9 g/dL — ABNORMAL LOW (ref 13.0–17.0)
Immature Granulocytes: 1 %
Lymphocytes Relative: 8 %
Lymphs Abs: 0.5 10*3/uL — ABNORMAL LOW (ref 0.7–4.0)
MCH: 32.4 pg (ref 26.0–34.0)
MCHC: 33.4 g/dL (ref 30.0–36.0)
MCV: 97 fL (ref 80.0–100.0)
Monocytes Absolute: 0.9 10*3/uL (ref 0.1–1.0)
Monocytes Relative: 13 %
Neutro Abs: 5.3 10*3/uL (ref 1.7–7.7)
Neutrophils Relative %: 74 %
Platelets: 213 10*3/uL (ref 150–400)
RBC: 3.36 MIL/uL — ABNORMAL LOW (ref 4.22–5.81)
RDW: 15.7 % — ABNORMAL HIGH (ref 11.5–15.5)
WBC: 7.1 10*3/uL (ref 4.0–10.5)
nRBC: 0 % (ref 0.0–0.2)

## 2022-08-29 LAB — COMPREHENSIVE METABOLIC PANEL
ALT: 15 U/L (ref 0–44)
AST: 18 U/L (ref 15–41)
Albumin: 3.1 g/dL — ABNORMAL LOW (ref 3.5–5.0)
Alkaline Phosphatase: 56 U/L (ref 38–126)
Anion gap: 17 — ABNORMAL HIGH (ref 5–15)
BUN: 38 mg/dL — ABNORMAL HIGH (ref 6–20)
CO2: 23 mmol/L (ref 22–32)
Calcium: 8.9 mg/dL (ref 8.9–10.3)
Chloride: 94 mmol/L — ABNORMAL LOW (ref 98–111)
Creatinine, Ser: 7.76 mg/dL — ABNORMAL HIGH (ref 0.61–1.24)
GFR, Estimated: 7 mL/min — ABNORMAL LOW (ref >60)
Glucose, Bld: 107 mg/dL — ABNORMAL HIGH (ref 70–99)
Potassium: 5.3 mmol/L — ABNORMAL HIGH (ref 3.5–5.1)
Sodium: 134 mmol/L — ABNORMAL LOW (ref 135–145)
Total Bilirubin: 0.4 mg/dL (ref 0.3–1.2)
Total Protein: 6.6 g/dL (ref 6.5–8.1)

## 2022-08-29 LAB — HEPATIC FUNCTION PANEL
ALT: 14 U/L (ref 0–44)
AST: 11 U/L — ABNORMAL LOW (ref 15–41)
Albumin: 3 g/dL — ABNORMAL LOW (ref 3.5–5.0)
Alkaline Phosphatase: 58 U/L (ref 38–126)
Bilirubin, Direct: <0.1 mg/dL (ref 0.0–0.2)
Total Bilirubin: 0.3 mg/dL (ref 0.3–1.2)
Total Protein: 6.7 g/dL (ref 6.5–8.1)

## 2022-08-29 LAB — MAGNESIUM: Magnesium: 2.4 mg/dL (ref 1.7–2.4)

## 2022-08-29 MED ORDER — FENTANYL CITRATE PF 50 MCG/ML IJ SOSY
PREFILLED_SYRINGE | INTRAMUSCULAR | Status: AC
  Filled 2022-08-29: qty 2

## 2022-08-29 MED ORDER — FENTANYL 2500MCG IN NS 250ML (10MCG/ML) PREMIX INFUSION
INTRAVENOUS | Status: AC
  Administered 2022-08-29: 50 ug/h via INTRAVENOUS
  Filled 2022-08-29: qty 250

## 2022-08-29 MED ORDER — PROCAINAMIDE HCL 100 MG/ML IJ SOLN: 1.0000 mg/min | INTRAVENOUS | Status: DC

## 2022-08-29 MED ORDER — PROCAINAMIDE HCL 100 MG/ML IJ SOLN
750.0000 mg | Freq: Once | INTRAVENOUS | Status: DC
  Filled 2022-08-29: qty 7.5

## 2022-08-29 MED ORDER — POLYETHYLENE GLYCOL 3350 17 G PO PACK
17.0000 g | PACK | Freq: Every day | ORAL | Status: DC
  Administered 2022-08-29 – 2022-09-02 (×5): 17 g
  Filled 2022-08-29 (×5): qty 1

## 2022-08-29 MED ORDER — ROCURONIUM BROMIDE 10 MG/ML (PF) SYRINGE: 50.0000 mg | PREFILLED_SYRINGE | Freq: Once | INTRAVENOUS | Status: AC

## 2022-08-29 MED ORDER — SODIUM CHLORIDE 0.9 % IV SOLN
500.0000 mg | Freq: Once | INTRAVENOUS | Status: AC
  Administered 2022-08-29: 500 mg via INTRAVENOUS
  Filled 2022-08-29: qty 8

## 2022-08-29 MED ORDER — VASOPRESSIN 20 UNITS/100 ML INFUSION FOR SHOCK
INTRAVENOUS | Status: AC
  Administered 2022-08-29: 0.03 [IU]/min via INTRAVENOUS
  Filled 2022-08-29: qty 100

## 2022-08-29 MED ORDER — PROCAINAMIDE HCL 100 MG/ML IJ SOLN
1.0000 mg/min | INTRAVENOUS | Status: DC
  Administered 2022-08-29 – 2022-08-30 (×2): 1 mg/min via INTRAVENOUS
  Filled 2022-08-29 (×3): qty 10

## 2022-08-29 MED ORDER — PANTOPRAZOLE SODIUM 40 MG IV SOLR
40.0000 mg | Freq: Every day | INTRAVENOUS | Status: DC
  Administered 2022-08-29: 40 mg via INTRAVENOUS
  Filled 2022-08-29: qty 10

## 2022-08-29 MED ORDER — DOCUSATE SODIUM 50 MG/5ML PO LIQD
100.0000 mg | Freq: Two times a day (BID) | ORAL | Status: DC
  Administered 2022-08-29 – 2022-09-02 (×8): 100 mg
  Filled 2022-08-29 (×8): qty 10

## 2022-08-29 MED ORDER — ETOMIDATE 2 MG/ML IV SOLN
INTRAVENOUS | Status: AC
  Administered 2022-08-29: 20 mg
  Filled 2022-08-29: qty 20

## 2022-08-29 MED ORDER — MIDAZOLAM HCL 2 MG/2ML IJ SOLN
INTRAMUSCULAR | Status: AC
  Filled 2022-08-29: qty 2

## 2022-08-29 MED ORDER — LORAZEPAM 1 MG PO TABS
1.0000 mg | ORAL_TABLET | Freq: Three times a day (TID) | ORAL | Status: DC | PRN
  Administered 2022-09-03 – 2022-09-08 (×7): 1 mg via ORAL
  Filled 2022-08-29 (×3): qty 1
  Filled 2022-08-29: qty 2
  Filled 2022-08-29: qty 1
  Filled 2022-08-29 (×2): qty 2

## 2022-08-29 MED ORDER — LORAZEPAM 2 MG/ML IJ SOLN
INTRAMUSCULAR | Status: AC
  Filled 2022-08-29: qty 1

## 2022-08-29 MED ORDER — MIDAZOLAM BOLUS VIA INFUSION: 0.0000 mg | INTRAVENOUS | Status: DC | PRN

## 2022-08-29 MED ORDER — MIDAZOLAM-SODIUM CHLORIDE 100-0.9 MG/100ML-% IV SOLN
0.0000 mg/h | INTRAVENOUS | Status: DC
  Administered 2022-08-29: 5 mg/h via INTRAVENOUS
  Administered 2022-08-30: 8 mg/h via INTRAVENOUS
  Administered 2022-08-30 – 2022-08-31 (×3): 15 mg/h via INTRAVENOUS
  Filled 2022-08-29 (×6): qty 100

## 2022-08-29 MED ORDER — FENTANYL 2500MCG IN NS 250ML (10MCG/ML) PREMIX INFUSION
50.0000 ug/h | INTRAVENOUS | Status: DC
  Administered 2022-08-30 – 2022-09-01 (×6): 200 ug/h via INTRAVENOUS
  Administered 2022-09-02: 100 ug/h via INTRAVENOUS
  Filled 2022-08-29 (×7): qty 250

## 2022-08-29 MED ORDER — FENTANYL CITRATE PF 50 MCG/ML IJ SOSY: 50.0000 ug | PREFILLED_SYRINGE | Freq: Once | INTRAMUSCULAR | Status: DC

## 2022-08-29 MED ORDER — MORPHINE SULFATE (PF) 2 MG/ML IV SOLN
INTRAVENOUS | Status: AC
  Filled 2022-08-29: qty 1

## 2022-08-29 MED ORDER — ROCURONIUM BROMIDE 10 MG/ML (PF) SYRINGE
PREFILLED_SYRINGE | INTRAVENOUS | Status: AC
  Administered 2022-08-29: 100 mg
  Filled 2022-08-29: qty 10

## 2022-08-29 MED ORDER — FENTANYL CITRATE PF 50 MCG/ML IJ SOSY
PREFILLED_SYRINGE | INTRAMUSCULAR | Status: AC
  Administered 2022-08-29: 100 ug
  Filled 2022-08-29: qty 2

## 2022-08-29 MED ORDER — FENTANYL BOLUS VIA INFUSION
50.0000 ug | INTRAVENOUS | Status: DC | PRN
  Administered 2022-09-01: 100 ug via INTRAVENOUS
  Administered 2022-09-01: 50 ug via INTRAVENOUS
  Administered 2022-09-02 (×2): 100 ug via INTRAVENOUS
  Administered 2022-09-02: 50 ug via INTRAVENOUS
  Administered 2022-09-02 (×2): 100 ug via INTRAVENOUS

## 2022-08-29 MED ORDER — PROCAINAMIDE HCL 100 MG/ML IJ SOLN
750.0000 mg | Freq: Once | INTRAVENOUS | Status: AC
  Administered 2022-08-29: 750 mg via INTRAVENOUS
  Filled 2022-08-29: qty 7.5

## 2022-08-29 MED ORDER — VASOPRESSIN 20 UNITS/100 ML INFUSION FOR SHOCK
0.0000 [IU]/min | INTRAVENOUS | Status: DC
  Administered 2022-08-30 (×3): 0.04 [IU]/min via INTRAVENOUS
  Administered 2022-08-31: 0.03 [IU]/min via INTRAVENOUS
  Administered 2022-08-31: 0.04 [IU]/min via INTRAVENOUS
  Administered 2022-08-31 (×2): 0.03 [IU]/min via INTRAVENOUS
  Administered 2022-09-01: 0.02 [IU]/min via INTRAVENOUS
  Administered 2022-09-01: 0.04 [IU]/min via INTRAVENOUS
  Administered 2022-09-02: 0.03 [IU]/min via INTRAVENOUS
  Filled 2022-08-29 (×8): qty 100

## 2022-08-29 MED ORDER — ROCURONIUM BROMIDE 10 MG/ML (PF) SYRINGE
PREFILLED_SYRINGE | INTRAVENOUS | Status: AC
  Administered 2022-08-29: 50 mg via INTRAVENOUS
  Filled 2022-08-29: qty 10

## 2022-08-29 MED ORDER — NOREPINEPHRINE 4 MG/250ML-% IV SOLN
0.0000 ug/min | INTRAVENOUS | Status: DC
  Administered 2022-08-29: 2 ug/min via INTRAVENOUS
  Filled 2022-08-29: qty 250

## 2022-08-29 MED ORDER — PROPOFOL 1000 MG/100ML IV EMUL
INTRAVENOUS | Status: AC
  Filled 2022-08-29: qty 100

## 2022-08-29 NOTE — Progress Notes (Signed)
Patient ID: Victor Thompson, male   DOB: 1961/01/02, 61 y.o.   MRN: [id number]     Advanced Heart Failure Rounding Note  PCP-Cardiologist: Loralie Champagne, MD   Subjective:   Interval hx:  No significant events overnight.  Awake and comfortable on CPAP this morning.   Appears relatively euvolemic on exam after 2 L of volume removal via dialysis yesterday.   Objective:   Weight Range: 95.2 kg Body mass index is 32.87 kg/m.   Vital Signs:   Temp:  [97.8 F (36.6 C)-98.9 F (37.2 C)] 98.2 F (36.8 C) (09/24 0800) Pulse Rate:  [50-95] 95 (09/24 0700) Resp:  [8-22] 20 (09/24 0700) BP: (68-200)/(33-109) 125/99 (09/24 0700) SpO2:  [87 %-100 %] 99 % (09/24 0700) Weight:  [95.2 kg-101 kg] 95.2 kg (09/24 0500) Last BM Date :  (pt does not know)  Weight change: Filed Weights   08/28/22 1345 08/28/22 1751 08/29/22 0500  Weight: 101 kg 97.7 kg 95.2 kg    Intake/Output:   Intake/Output Summary (Last 24 hours) at 08/29/2022 0947 Last data filed at 08/29/2022 0800 Gross per 24 hour  Intake 569.39 ml  Output 2000 ml  Net -1430.61 ml       Physical Exam    General: Awake, alert; on CPAP. Neck: Unable to accurately assess JVP due to body habitus Lungs: CTA bilaterally CV: Nondisplaced PMI.  Heart regular S1/S2, no S3/S4, no murmur.  No peripheral edema.  No carotid bruit.  Normal pedal pulses.  Abdomen: Soft, nontender, no hepatosplenomegaly, no distention.  Skin: Intact without lesions or rashes.  Neurologic: Awake/follows commands.  Extremities: No clubbing or cyanosis.  HEENT: Normal.    Telemetry   Sinus rhythm in the 70s to 80s; no significant arrhythmia overnight.  EKG    N/A  Labs    CBC Recent Labs    08/28/22 0702 08/29/22 0637  WBC 8.2 7.1  NEUTROABS 6.0 5.3  HGB 12.0* 10.9*  HCT 35.6* 32.6*  MCV 99.4 97.0  PLT 168 638    Basic Metabolic Panel Recent Labs    08/27/22 0430 08/28/22 0918 08/29/22 0637  NA 136 134* 136  K 4.6 4.6 4.4  CL 101  97* 95*  CO2 '26 25 27  '$ GLUCOSE 97 102* 99  BUN 20 33* 27*  CREATININE 4.65* 6.94* 6.40*  CALCIUM 8.2* 8.5* 8.9  MG 2.7*  --   --   PHOS 3.3 4.8* 4.6    Liver Function Tests Recent Labs    08/28/22 0918 08/29/22 0637  ALBUMIN 3.1* 3.0*    No results for input(s): "LIPASE", "AMYLASE" in the last 72 hours. Cardiac Enzymes No results for input(s): "CKTOTAL", "CKMB", "CKMBINDEX", "TROPONINI" in the last 72 hours.  BNP: BNP (last 3 results) Recent Labs    05/25/22 0705 08/16/22 0627 08/16/22 1257  BNP 1,355.4* QUANTITY NOT SUFFICIENT, UNABLE TO PERFORM TEST 399.2*     Other results:  RHC/LHC (9/20):  Coronary Findings  Diagnostic Dominance: Right Left Anterior Descending  Ost LAD to Prox LAD lesion is 40% stenosed.    First Diagonal Branch  1st Diag lesion is 60% stenosed.    Left Circumflex  Mid Cx lesion is 30% stenosed.    Right Coronary Artery  Prox RCA lesion is 30% stenosed.    Intervention   No interventions have been documented.   Right Heart  Right Heart Pressures RHC Procedural Findings: Hemodynamics (mmHg) RA mean 14 RV 52/15 PA 53/27, mean 37 PCWP mean 26 LV 97/29 AO  103/64  Oxygen saturations: PA 76% AO 100%  Cardiac Output (Fick) 8.98  Cardiac Index (Fick) 3.96 PVR 2.8 WU    Imaging   Reviewed  Medications:     Scheduled Medications:  aspirin  81 mg Oral Daily   atorvastatin  80 mg Oral QHS   Chlorhexidine Gluconate Cloth  6 each Topical Q0600   cholecalciferol  5,000 Units Oral Daily   heparin  5,000 Units Subcutaneous Q8H   quiNIDine sulfate  200 mg Oral Q8H   sodium chloride flush  10-40 mL Intracatheter Q12H    Infusions:  sodium chloride Stopped (08/26/22 1922)   amiodarone 30 mg/hr (08/29/22 0607)   anticoagulant sodium citrate      PRN Medications: acetaminophen, albuterol, alteplase, anticoagulant sodium citrate, heparin, ondansetron (ZOFRAN) IV, mouth rinse, sodium chloride flush    Patient  Profile   [name] is 61 year old with a history of VT, ESRD, CAD, ischemic CMP, and ESRD.  He had PCI in 6812, uncertain what vessel was involved. Echo 2/22: EF 25-30% with moderate LV dilation and normal RV.  He had VT arrest in 2015, has [employer] subcutaneous ICD.  [name] in 2/22.  LHC/RHC was done in 8/22, showing no significant CAD and stable hemodynamics.   Admitted with refractory VT complicated by shock.    Assessment/Plan   1. VT storm: Has has a Animator ICD.  Refractory monomorphic VT. Multiple shocks.  Later alternating NSR with a slow wide complex AIVR-like rhythm.  Cath on 9/20 with nonobstructive CAD.  -Continue quinidine and amiodarone.  Plan to transfer to the floor today with transition to p.o. amiodarone tomorrow and likely starting beta-blocker tomorrow. - Long term very concerning, not candidate for LVAD with ESRD.  Discussed with Dr. Quentin Ore and not candidate for VT ablation.   2. Acute on chronic systolic CHF with cardiogenic shock: In setting of incessant VT.  H/o NICM, echo this admission with EF 20-25%, normal RV function, mild-moderate MR, IVC dilated.  At last admission, unable to complete cardiac MRI due to artifact from [facility] ICD. PYP scan was negative.   -2 L of volume removal yesterday (CVP 9); euvolemic on exam.  3. CAD: ?PCI in 2005, unsure what vessel.  Coronary angiography in 8/22 showed no significant CAD and also does not appear to show prior stent so h/o CAD is questionable. LHC this admission with nonobstructive CAD.  - Continue ASA 81 and statin.   4. ESRD: Since 2/22. TTS HD.  -Tolerated IHD well yesterday 2 L of volume removal  5. Acute hypoxemic respiratory failure: In setting of VT storm.  -Extubated 48 hours ago; no signs of respiratory distress we will plan on transferring to the floor.  CRITICAL CARE Performed by: Hebert Soho   Total critical care time: 30 minutes  Critical care time was exclusive of separately billable  procedures and treating other patients.  Critical care was necessary to treat or prevent imminent or life-threatening deterioration.  Critical care was time spent personally by me on the following activities: development of treatment plan with patient and/or surrogate as well as nursing, discussions with consultants, evaluation of patient's response to treatment, examination of patient, obtaining history from patient or surrogate, ordering and performing treatments and interventions, ordering and review of laboratory studies, ordering and review of radiographic studies, pulse oximetry and re-evaluation of patient's condition.

## 2022-08-29 NOTE — Progress Notes (Signed)
CTSP for recurrent VT by Janett Billow Willis--multiple repeated shocks for VT monomorphic,  reintubated, rebolused with amio, quinidine now on hold and will try procainamide, dosing at '1mg'$ /min given   Have spoken with CHF  will use vasopressin for BP support if necessary  The issue of sarcoid is in the background and discussion about empiric steroids with CHF   Intubated   Tried to call cousin, could  not reach  nurse contacted her Prognosis very poor    Mechanism of the VT if it were ischemic which is not as likely as electromechanical the treatment would be lidocaine to which he has an alleregy ( angioedema--precluding)   [phone]

## 2022-08-29 NOTE — Progress Notes (Signed)
NAME:  Victor Thompson, MRN:  [id number], DOB:  [birth year], LOS: 5 ADMISSION DATE:  08/24/2022, CONSULTATION DATE:  08/29/22 REFERRING MD:  Aundra Dubin - HF, CHIEF COMPLAINT:  VT storm   History of Present Illness:  61 year old male with PMH Systolic HF (followed by Dr. Aundra Dubin, EF 25-30), H/O VT arrest, s/p ICD, on amiodarone. Presented from dialysis 9/19 with palpitations and feeling faint. In ED, recurrent episodes of VT with repeated [place] IVD discharges. Denied chest pain or dyspnea. Refractory despite shock x 3, (along with internal shocks), amiodarone, lopressor, and bolus of procainamide, (allergy to lidocaine with angioedema) Ultimately requiring intubation and paralytics.   Started on quinidine 9/20. Continued improvement. Extubated 9/22.   Pertinent  Medical History   Past Medical History:  Diagnosis Date   Acute on chronic systolic CHF (congestive heart failure) ([place]) 06/14/2020   Acute respiratory failure (HCC) 05/28/2022   AICD (automatic cardioverter/defibrillator) present    2015   Allergy    Asthma    no meds   Cardiac arrest [facility] 2015   Chest pain 05/25/2022   CHF (congestive heart failure) (HCC)    Coronary artery disease    ESRD on hemodialysis (HCC)    ckd -stage 5   Hyperlipidemia    Hypertension    Myocardial infarction ([place])    Obesity    Pneumonia    Shortness of breath dyspnea    Sleep apnea    USES CPAP   Wears glasses      Significant Hospital Events: Including procedures, antibiotic start and stop dates in addition to other pertinent events   9/19 - intubated, central line placed 9/20 - R/LHC performed, CVC placed into left internal jugular vein for CRRT 9/21 - no further VT  9/22 - extubated, off pressors, off sedation, right radial arterial line removed, CRRT dc'd  Interim History / Subjective:  Patient went back into nonsustained VT's, his ICD shocked him 5 times, despite amiodarone infusion and boluses  Objective   Blood pressure (!) 133/96,  pulse 98, temperature 97.9 F (36.6 C), temperature source Axillary, resp. rate 12, height '5\' 7"'$  (1.702 m), weight 95.2 kg, SpO2 100 %. CVP:  [12 mmHg-13 mmHg] 13 mmHg      Intake/Output Summary (Last 24 hours) at 08/29/2022 1915 Last data filed at 08/29/2022 1230 Gross per 24 hour  Intake 463.39 ml  Output --  Net 463.39 ml   Filed Weights   08/28/22 1345 08/28/22 1751 08/29/22 0500  Weight: 101 kg 97.7 kg 95.2 kg    Examination: Physical exam: General: Acute on chronically ill-appearing male, lying on the bed, looks in distress due to chest pain from ICD shocks HEENT: [place]/AT, eyes anicteric.  moist mucus membranes Neuro: Alert, awake following commands Chest: Coarse breath sounds, no wheezes or rhonchi Heart: Regular rate and rhythm, no murmurs or gallops Abdomen: Soft, nontender, nondistended, bowel sounds present Skin: No rash   Ancillary tests personally reviewed:   Few PVC only on telemetry.  Echo shows EF 20%  Assessment & Plan:  Recurrent ventricular storm  S/p procainamide load 9/19 No sign of ischemic cause on R/LHC, likely due to volume overload with PWCP 26 Patient had recurrent VT storm's, he was shocked by ICD x5 EP and general cardiology are following Continue amiodarone Continue quinidine Monitor electrolytes and replete as needed, goal of K > 4.0 and Mg > 2.0   Acute respiratory failure with hypoxia Patient remained on 2 L nasal cannula oxygen with frequent ICD  shocks, he looked uncomfortable, started desatting Decision was to proceed with endotracheal intubation and mechanical ventilation Continue PEG protocol with Versed and fentanyl Continue lung protective ventilation VAP bundle in place  Shock in setting of ventricular storm > resolved   Chronic systolic CHF EF is 93% Advanced heart failure team is following  ESRD on HD On intermittent HD per Nephrology   Morbid obesity Dietitian is following  Best Practice (right click and "Reselect all  SmartList Selections" daily)   Diet/type: NPO DVT prophylaxis: systemic heparin GI prophylaxis: PPI Lines: Central line and Dialysis Catheter Foley:  N/A Code Status:  full code Last date of multidisciplinary goals of care discussion [Per primary team]   Total critical care time: 37 minutes  Performed by: [place] care time was exclusive of separately billable procedures and treating other patients.   Critical care was necessary to treat or prevent imminent or life-threatening deterioration.   Critical care was time spent personally by me on the following activities: development of treatment plan with patient and/or surrogate as well as nursing, discussions with consultants, evaluation of patient's response to treatment, examination of patient, obtaining history from patient or surrogate, ordering and performing treatments and interventions, ordering and review of laboratory studies, ordering and review of radiographic studies, pulse oximetry and re-evaluation of patient's condition.   Jacky Kindle, MD [facility] See Amion for pager If no response to pager, please call [phone] until 7pm After 7pm, Please call E-link [phone]

## 2022-08-29 NOTE — Progress Notes (Signed)
This RN called into room by Blossom Hoops, RN. Pt had been talking and eating, up in the chair when he  then went into VT. Pt alerted and oriented, and able to communicate. This RN, Agricultural consultant, and Katie assisted pt back into bed. Cardiology MD notified and en route. '150mg'$  amio bolus given, increased dose to 60. At 1801, pt ICD shocked him at around 200 BPM. EKG completed and uploaded to chart. Cardiology MD stated to keep pt on increased amio dose and to remain in bed.Pharmacy at bedside.   At [place], pt went back into VT and became unresponsive for about 30 seconds. ICD shocked again at this time. Cardiology and CCM paged.  1845: shock x2, Dr. Tacy Learn at bedside 1845: shock  Dr. Tacy Learn discuss intubation with pt, pt agreed and expressed understanding. Respiratory notified  6728: 100 fent 9791: etomidate and roc, intubation. See provider note.

## 2022-08-29 NOTE — Procedures (Signed)
Intubation Procedure Note  Victor Thompson  [id number]  [birth year]  Date:08/29/22  Time:7:14 PM   Provider Performing:Keriann Rankin    Procedure: Intubation (31500)  Indication(s) Respiratory Failure  Consent Risks of the procedure as well as the alternatives and risks of each were explained to the patient and/or caregiver.  Consent for the procedure was obtained and is signed in the bedside chart   Anesthesia Etomidate and Rocuronium   Time Out Verified patient identification, verified procedure, site/side was marked, verified correct patient position, special equipment/implants available, medications/allergies/relevant history reviewed, required imaging and test results available.   Sterile Technique Usual hand hygeine, masks, and gloves were used   Procedure Description Patient positioned in bed supine.  Sedation given as noted above.  Patient was intubated with endotracheal tube using  MAC 4 .  View was Grade 1 full glottis .  Number of attempts was 1.  Colorimetric CO2 detector was consistent with tracheal placement.   Complications/Tolerance None; patient tolerated the procedure well. Chest X-ray is ordered to verify placement.   EBL Minimal   Specimen(s) None

## 2022-08-29 NOTE — Procedures (Signed)
Intraosseous Needle Insertion Procedure Note    Date:08/29/22  Time:7:16 PM   Provider Performing:Haleema Vanderheyden   Procedure: Insertion Intraosseous (11572)  Indication(s) Medication administration  Consent Risks of the procedure as well as the alternatives and risks of each were explained to the patient and/or caregiver.  Consent for the procedure was obtained and is signed in the bedside chart  Anesthesia Topical only with 1% lidocaine   Timeout Verified patient identification, verified procedure, site/side was marked, verified correct patient position, special equipment/implants available, medications/allergies/relevant history reviewed, required imaging and test results available.  Procedure Description Area of needle insertion was cleaned with chlorhexidine. Intraosseous needle was placed into the right tibia. Bone marrow was aspirated and site easily flushed. The needle was secured in place and dressing applied.  Complications/Tolerance None; patient tolerated the procedure well.  EBL Minimal

## 2022-08-29 NOTE — Progress Notes (Signed)
Came to bedside to eval for CVL  Has good anatomy for RIJ HAs Left HD cath trialysuis  RN says - currently in fent, versed and amio through trialyusis port and is all compatible. EP going to change amio to procainamide. Patient on vent. No escalating needs for care. Patient has backup IO that is good for 24h. PIV by IV team - RN plans to call  Plan  - hold off CVL for now - try PIV via IV team  - can use IO - call for CVL if escclating needs or urgent need     SIGNATURE    Dr. Brand Males, M.D., F.C.C.P,  Pulmonary and Critical Care Medicine Staff Physician, [place] Director - Interstitial Lung Disease  Program  Medical Director - [facility] [place] at [place], [place], [address]  NPI Number:  NPI #[id number] [place] Number: [id number]  Pager: [phone], If no answer  -[place] or Try [phone] Telephone (clinical office): [phone] Telephone (research): [phone]  8:08 PM 08/29/2022

## 2022-08-29 NOTE — Procedures (Addendum)
Central Venous Catheter Insertion Procedure Note  Victor Thompson  [id number]  Aug 29, 1961  Date:08/29/22  Time:11:15 PM   Provider Performing:Terrion Gencarelli   Procedure: Insertion of Non-tunneled Central Venous [phone]) with US guidance ([address]073)   Indication(s) Medication administration and Difficult access - Intiially felt not needed but cardiology wanted access soon so had to be done   Consent Unable to obtain consent due to emergent nature of procedure. -= called mom listed even after procedure and LMTCB  Anesthesia SEDATED WITH FENTANYL Roc '50mg'$  IV x 1 given  Timeout Verified patient identification, verified procedure, site/side was marked, verified correct patient position, special equipment/implants available, medications/allergies/relevant history reviewed, required imaging and test results available.  Sterile Technique Maximal sterile technique including full sterile barrier drape, hand hygiene, sterile gown, sterile gloves, mask, hair covering, sterile ultrasound probe cover (if used).  Procedure Description Area of catheter insertion was cleaned with chlorhexidine and draped in sterile fashion.  With real-time ultrasound guidance a central venous catheter was placed into the right internal jugular vein. Nonpulsatile blood flow and easy flushing noted in all ports.  The catheter was sutured in place and sterile dressing applied. Scecure suture also placed  Complications/Tolerance None; patient tolerated the procedure well. Chest X-ray is ordered to verify placement for internal jugular or subclavian cannulation.    EBL Minimal  Specimen(s) None  Cxr poost line looks good to use line    SIGNATURE    Dr. Brand Males, M.D., F.C.C.P,  Pulmonary and Critical Care Medicine Staff Physician, [place] Director - Interstitial Lung Disease  Program  Medical Director - [facility] [place] at  [place], [place], [address]  NPI Number:  NPI #[id number] [facility] Number: [id number]  Pager: [phone], If no answer  -[place] or [place] Telephone (clinical office): [phone] Telephone (research): [phone]  11:16 PM 08/29/2022

## 2022-08-29 NOTE — Progress Notes (Signed)
[facility] Progress Note  Subjective: pt seen in ICU, only c/o is poor sleep. No cough, SOB or swelling.   Vitals:   08/29/22 0900 08/29/22 1000 08/29/22 1100 08/29/22 1200  BP:   113/73 104/69  Pulse: 72 76 70 69  Resp: '12 17 11 12  '$ Temp:    97.9 F (36.6 C)  TempSrc:    Axillary  SpO2: 98% 98% 97% 95%  Weight:      Height:        Exam:  alert, nad   no jvd  Chest cta bilat  Cor reg no RG  Abd soft ntnd no ascites   Ext trace LE edema   Alert, NF, ox3    AVF+bruit    Home meds include - albuterol, amiodarone, aspirin, atorvastatin, auryxia 1 ac tid, calc acetate 2 ac, carvedilol 6.25 bid, colace, symbicort, velphoro 2 ac tid, prns/ vits/ supps    OP HD: [place] TTS  4h 96mn  450/ 1.5  116.4kg  AVF  15g  Hep 3000 - hectorol 7 ug iv tiw - no esa     Assessment/ Plan: VT storm / VF - pt rx'd w/ IV amio, po quinidine, sedation / intubation, paralytics and procainamide. Remains on IV amio and quinidine. No further VT. Per cardiology not a candidate for VT ablation. Per cards/ CCM.  Shock/ hypotension - resolved, off pressors, BP's low normal. Not on any BP meds.  ESRD - on HD TTS. SP CRRT 9/20- 9/22. SP regular HD Saturday. Next HD 9/26. Will need to remove temp HD cath when no longer using (IV amiodarone).  HFrEF / EF 20-25%/ sp ICD - not LVAD candidate due to esrd Volume - much improved, weights seem off yest and today. Looks euvolemic.  Anemia esrd - Hb 10-12, no esa needs for now MBD ckd - CCa in range, phos a bit high.  Will resume auryxia 1 ac tid for now (usual dose is 2 ac). Phos is 4-5 range.   Rob Zeno Hickel 08/29/2022, 4:02 PM   Recent Labs  Lab 08/28/22 0702 08/28/22 0918 08/29/22 0637  HGB 12.0*  --  10.9*  ALBUMIN  --  3.1* 3.0*  3.0*  CALCIUM  --  8.5* 8.9  PHOS  --  4.8* 4.6  CREATININE  --  6.94* 6.40*  K  --  4.6 4.4    No results for input(s): "IRON", "TIBC", "FERRITIN" in the last 168 hours. Inpatient medications:  aspirin   81 mg Oral Daily   atorvastatin  80 mg Oral QHS   Chlorhexidine Gluconate Cloth  6 each Topical Q0600   cholecalciferol  5,000 Units Oral Daily   heparin  5,000 Units Subcutaneous Q8H   quiNIDine sulfate  200 mg Oral Q8H   sodium chloride flush  10-40 mL Intracatheter Q12H    sodium chloride Stopped (08/26/22 1922)   amiodarone 30 mg/hr (08/29/22 1200)   anticoagulant sodium citrate     acetaminophen, albuterol, alteplase, anticoagulant sodium citrate, heparin, LORazepam, ondansetron (ZOFRAN) IV, mouth rinse, sodium chloride flush

## 2022-08-29 NOTE — Progress Notes (Signed)
Electrophysiology Rounding Note  [name] Date of Encounter: 08/29/2022  Primary Cardiologist: Loralie Champagne, MD Electrophysiologist: Cristopher Peru, MD   Patient Profile     Victor Thompson is a 61 y.o. male with a hx of  hx of VF arrest (2015), CAD (PCI in 2005, unknown details), ICM,.  Previously implanted [employer] (elsewhere with GEN change 2021) chronic CHF (systolic), ESRF on HD , OSA w/CPAP, HTN, HLD, recurrent angio edema who was admitted for VT storm (per Dr. Elliot Cousin monomorphic and polymorphic.  (Greater than 20 shocks)  Has been admitted 6/23 with palpitations associated with polymorphic ventricular tachycardia   923 Echo EF 20-25%-chronic    Cath 08/25/2022   1st Diag lesion is 60% stenosed.   Ost LAD to Prox LAD lesion is 40% stenosed.   Mid Cx lesion is 30% stenosed.   Prox RCA lesion is 30% stenosed.   1. Elevated right and left heart filling pressures with pulmonary venous hypertension.  2. Nonobstructive CAD.   Treated with amiodarone and then quinidine.  Apparently concerns regarding mexiletine (chart describes allergy to lidocaine) and question angioedema.   Subjective   No chest pain or shortness of breath.  Terrified of shocks.  Unable to sleep.  Inpatient Medications    Scheduled Meds:  aspirin  81 mg Oral Daily   atorvastatin  80 mg Oral QHS   Chlorhexidine Gluconate Cloth  6 each Topical Q0600   cholecalciferol  5,000 Units Oral Daily   heparin  5,000 Units Subcutaneous Q8H   quiNIDine sulfate  200 mg Oral Q8H   sodium chloride flush  10-40 mL Intracatheter Q12H   Continuous Infusions:  sodium chloride Stopped (08/26/22 1922)   amiodarone 30 mg/hr (08/29/22 1000)   anticoagulant sodium citrate     PRN Meds: acetaminophen, albuterol, alteplase, anticoagulant sodium citrate, heparin, ondansetron (ZOFRAN) IV, mouth rinse, sodium chloride flush   Vital Signs    Vitals:   08/29/22 0800 08/29/22 0900 08/29/22 1000  08/29/22 1100  BP: 136/66   113/73  Pulse: 63 72 76 70  Resp: '17 12 17 11  '$ Temp: 98.2 F (36.8 C)     TempSrc: Axillary     SpO2: 96% 98% 98% 97%  Weight:      Height:        Intake/Output Summary (Last 24 hours) at 08/29/2022 1116 Last data filed at 08/29/2022 1000 Gross per 24 hour  Intake 603.51 ml  Output 2000 ml  Net -1396.49 ml    Filed Weights   08/28/22 1345 08/28/22 1751 08/29/22 0500  Weight: 101 kg 97.7 kg 95.2 kg    Physical Exam     BP 113/73   Pulse 70   Temp 98.2 F (36.8 C) (Axillary)   Resp 11   Ht '5\' 7"'$  (1.702 m)   Wt 95.2 kg   SpO2 97%   BMI 32.87 kg/m  Well developed and Morbidly obese in no acute distress HENT normal Neck supple with   Clear Device pocket well healed; without hematoma or erythema.  There is no tethering  Regular rate and rhythm, no  gallop No murmur Abd-soft with active BS No Clubbing cyanosis  edema Skin-warm and dry A & Oriented  Grossly normal sensory and motor function        Labs    CBC Recent Labs    08/28/22 0702 08/29/22 0637  WBC 8.2 7.1  NEUTROABS 6.0 5.3  HGB 12.0* 10.9*  HCT 35.6* 32.6*  MCV 99.4 97.0  PLT 168 621    Basic Metabolic Panel Recent Labs    08/27/22 0430 08/28/22 0918 08/29/22 0637  NA 136 134* 136  K 4.6 4.6 4.4  CL 101 97* 95*  CO2 '26 25 27  '$ GLUCOSE 97 102* 99  BUN 20 33* 27*  CREATININE 4.65* 6.94* 6.40*  CALCIUM 8.2* 8.5* 8.9  MG 2.7*  --   --   PHOS 3.3 4.8* 4.6    Liver Function Tests Recent Labs    08/28/22 0918 08/29/22 0637  AST  --  11*  ALT  --  14  ALKPHOS  --  58  BILITOT  --  0.3  PROT  --  6.7  ALBUMIN 3.1* 3.0*  3.0*    No results for input(s): "LIPASE", "AMYLASE" in the last 72 hours. Cardiac Enzymes No results for input(s): "CKTOTAL", "CKMB", "CKMBINDEX", "TROPONINI" in the last 72 hours.   Telemetry    Sinus in the 70s(personally reviewed)  Radiology    No results found.   Assessment & Plan    1.  VT storm / refractory  VT  Sinus bradycardia  Cardiomyopathy-nonischemic   Chronic systolic CHF Coronary angiography in 07/2021 showed no significant CAD,  PYP scan negative  Planning for PET as outpatient for possible Sarcoid.     ESRD on CRRT   Treated sleep apnea  PTSD    No further VT.  We will continue amiodarone and quinidine.  We will transition his amiodarone to p.o. tomorrow.  Unable to sleep, anxious about recurrent shocks.  We will begin him on low-dose Ativan.  Blood pressure and heart rate are okay; will resume carvedilol 6.25     For questions or updates, please contact [place] Please consult [email or web address] for contact info under Cardiology/STEMI.  Signed, Virl Axe, MD  08/29/2022, 11:16 AM

## 2022-08-29 NOTE — Progress Notes (Signed)
Called and spoke with mother and sister and in hospital with fiancee About the increasingly limited options of managing his VT and that each night and hour is important  35 min

## 2022-08-29 NOTE — Progress Notes (Signed)
Discussed with Celesta Gentile  And called and spoke with mother and sister

## 2022-08-29 NOTE — Procedures (Signed)
Arterial Catheter Insertion Procedure Note  MUSCAB BRENNEMAN  [id number]  12/30/1960  Date:08/29/22  Time:10:15 PM    Provider Performing: Wynelle Beckmann    Procedure: Insertion of Arterial Line [phone]) without US guidance  Indication(s) Blood pressure monitoring and/or need for frequent ABGs  Consent Unable to obtain consent due to inability to find a medical decision maker for patient.  All reasonable efforts were made.  Another independent medical provider,   , confirmed the benefits of this procedure outweigh the risks.  Anesthesia None   Time Out Verified patient identification, verified procedure, site/side was marked, verified correct patient position, special equipment/implants available, medications/allergies/relevant history reviewed, required imaging and test results available.   Sterile Technique Maximal sterile technique including full sterile barrier drape, hand hygiene, sterile gown, sterile gloves, mask, hair covering, sterile ultrasound probe cover (if used).   Procedure Description Area of catheter insertion was cleaned with chlorhexidine and draped in sterile fashion. Without real-time ultrasound guidance an arterial catheter was placed into the left radial artery.  Appropriate arterial tracings confirmed on monitor.     Complications/Tolerance None; patient tolerated the procedure well.   EBL Minimal   Specimen(s) None

## 2022-08-30 ENCOUNTER — Inpatient Hospital Stay (HOSPITAL_COMMUNITY): Payer: Medicare Other

## 2022-08-30 LAB — COMPREHENSIVE METABOLIC PANEL
ALT: 14 U/L (ref 0–44)
AST: 9 U/L — ABNORMAL LOW (ref 15–41)
Albumin: 2.8 g/dL — ABNORMAL LOW (ref 3.5–5.0)
Alkaline Phosphatase: 54 U/L (ref 38–126)
Anion gap: 15 (ref 5–15)
BUN: 42 mg/dL — ABNORMAL HIGH (ref 6–20)
CO2: 24 mmol/L (ref 22–32)
Calcium: 8.8 mg/dL — ABNORMAL LOW (ref 8.9–10.3)
Chloride: 94 mmol/L — ABNORMAL LOW (ref 98–111)
Creatinine, Ser: 8.31 mg/dL — ABNORMAL HIGH (ref 0.61–1.24)
GFR, Estimated: 7 mL/min — ABNORMAL LOW (ref >60)
Glucose, Bld: 147 mg/dL — ABNORMAL HIGH (ref 70–99)
Potassium: 5.1 mmol/L (ref 3.5–5.1)
Sodium: 133 mmol/L — ABNORMAL LOW (ref 135–145)
Total Bilirubin: 0.3 mg/dL (ref 0.3–1.2)
Total Protein: 6.3 g/dL — ABNORMAL LOW (ref 6.5–8.1)

## 2022-08-30 LAB — GLUCOSE, CAPILLARY
Glucose-Capillary: 166 mg/dL — ABNORMAL HIGH (ref 70–99)
Glucose-Capillary: 158 mg/dL — ABNORMAL HIGH (ref 70–99)
Glucose-Capillary: 142 mg/dL — ABNORMAL HIGH (ref 70–99)

## 2022-08-30 LAB — CBC WITH DIFFERENTIAL/PLATELET
Abs Immature Granulocytes: 0.04 10*3/uL (ref 0.00–0.07)
Basophils Absolute: 0 10*3/uL (ref 0.0–0.1)
Basophils Relative: 0 %
Eosinophils Absolute: 0 10*3/uL (ref 0.0–0.5)
Eosinophils Relative: 0 %
HCT: 28.8 % — ABNORMAL LOW (ref 39.0–52.0)
Hemoglobin: 9.8 g/dL — ABNORMAL LOW (ref 13.0–17.0)
Immature Granulocytes: 1 %
Lymphocytes Relative: 4 %
Lymphs Abs: 0.3 10*3/uL — ABNORMAL LOW (ref 0.7–4.0)
MCH: 32.8 pg (ref 26.0–34.0)
MCHC: 34 g/dL (ref 30.0–36.0)
MCV: 96.3 fL (ref 80.0–100.0)
Monocytes Absolute: 0.2 10*3/uL (ref 0.1–1.0)
Monocytes Relative: 3 %
Neutro Abs: 6.6 10*3/uL (ref 1.7–7.7)
Neutrophils Relative %: 92 %
Platelets: 197 10*3/uL (ref 150–400)
RBC: 2.99 MIL/uL — ABNORMAL LOW (ref 4.22–5.81)
RDW: 15.4 % (ref 11.5–15.5)
WBC: 7.1 10*3/uL (ref 4.0–10.5)
nRBC: 0 % (ref 0.0–0.2)

## 2022-08-30 LAB — COOXEMETRY PANEL
Carboxyhemoglobin: 2.1 % — ABNORMAL HIGH (ref 0.5–1.5)
Methemoglobin: <0.7 % (ref 0.0–1.5)
O2 Saturation: 81.8 %
Total hemoglobin: 9.1 g/dL — ABNORMAL LOW (ref 12.0–16.0)

## 2022-08-30 LAB — MAGNESIUM
Magnesium: 2.2 mg/dL (ref 1.7–2.4)
Magnesium: 2.2 mg/dL (ref 1.7–2.4)

## 2022-08-30 LAB — PHOSPHORUS
Phosphorus: 4.3 mg/dL (ref 2.5–4.6)
Phosphorus: 6.7 mg/dL — ABNORMAL HIGH (ref 2.5–4.6)

## 2022-08-30 MED ORDER — ORAL CARE MOUTH RINSE
15.0000 mL | OROMUCOSAL | Status: DC
  Administered 2022-08-30 – 2022-09-02 (×41): 15 mL via OROMUCOSAL

## 2022-08-30 MED ORDER — RENA-VITE PO TABS
1.0000 | ORAL_TABLET | Freq: Every day | ORAL | Status: DC
  Administered 2022-08-30 – 2022-09-01 (×3): 1
  Filled 2022-08-30 (×3): qty 1

## 2022-08-30 MED ORDER — PANTOPRAZOLE 2 MG/ML SUSPENSION
40.0000 mg | Freq: Every day | ORAL | Status: DC
  Administered 2022-08-30 – 2022-09-02 (×4): 40 mg
  Filled 2022-08-30 (×4): qty 20

## 2022-08-30 MED ORDER — PREDNISONE 20 MG PO TABS
40.0000 mg | ORAL_TABLET | Freq: Every day | ORAL | Status: DC
  Administered 2022-08-30 – 2022-09-02 (×4): 40 mg
  Filled 2022-08-30 (×4): qty 2

## 2022-08-30 MED ORDER — VITAL HIGH PROTEIN PO LIQD: 1000.0000 mL | ORAL | Status: DC

## 2022-08-30 MED ORDER — VITAL 1.5 CAL PO LIQD
1000.0000 mL | ORAL | Status: DC
  Administered 2022-08-30 – 2022-08-31 (×2): 1000 mL

## 2022-08-30 MED ORDER — ORAL CARE MOUTH RINSE: 15.0000 mL | OROMUCOSAL | Status: DC | PRN

## 2022-08-30 MED ORDER — PROSOURCE TF20 ENFIT COMPATIBL EN LIQD
60.0000 mL | Freq: Two times a day (BID) | ENTERAL | Status: DC
  Administered 2022-08-30 – 2022-09-03 (×8): 60 mL
  Filled 2022-08-30 (×8): qty 60

## 2022-08-30 NOTE — Progress Notes (Signed)
NAME:  Victor Thompson, MRN:  [id number], DOB:  1961-02-13, LOS: 6 ADMISSION DATE:  08/24/2022, CONSULTATION DATE:  08/30/22 REFERRING MD:  Aundra Dubin - HF, CHIEF COMPLAINT:  VT storm   History of Present Illness:  61 year old male with PMH Systolic HF (followed by Dr. Aundra Dubin, EF 25-30), H/O VT arrest, s/p ICD, on amiodarone. Presented from dialysis 9/19 with palpitations and feeling faint. In ED, recurrent episodes of VT with repeated [place] IVD discharges. Denied chest pain or dyspnea. Refractory despite shock x 3, (along with internal shocks), amiodarone, lopressor, and bolus of procainamide, (allergy to lidocaine with angioedema) Ultimately requiring intubation and paralytics.   Started on quinidine 9/20. Continued improvement. Extubated 9/22. Reintubated   Pertinent Medical History:   Past Medical History:  Diagnosis Date   Acute on chronic systolic CHF (congestive heart failure) ([place]) 06/14/2020   Acute respiratory failure (HCC) 05/28/2022   AICD (automatic cardioverter/defibrillator) present    2015   Allergy    Asthma    no meds   Cardiac arrest [facility]) 2015   Chest pain 05/25/2022   CHF (congestive heart failure) (HCC)    Coronary artery disease    ESRD on hemodialysis (HCC)    ckd -stage 5   Hyperlipidemia    Hypertension    Myocardial infarction ([place])    Obesity    Pneumonia    Shortness of breath dyspnea    Sleep apnea    USES CPAP   Wears glasses     Significant Hospital Events: Including procedures, antibiotic start and stop dates in addition to other pertinent events   9/19 - Intubated, central line placed 9/20 - R/LHC performed, CVC placed into left internal jugular vein for CRRT 9/21 - No further VT  9/22 - Extubated, off pressors, off sedation, right radial arterial line removed, CRRT dc'd 9/24 - Reintubated in the setting of need for increased sedation and recurrent VT (monomorphic). Rebolus with amiodarone. Procainamide started. IO placed. CVC placed later in the  evening. Aline replaced. Brady/hypotensive overnight, amio decreased. NE/Vaso resumed. 9/25 - Remains on amio/procainamide. Brady in 71s. Off NE, on vaso 0.04. CVP 8-10. Keeping sedated per EP.  Interim History / Subjective:  Reintubated 9/24PM in the setting of need for increased sedation and recurrent VT (monomorphic) Rebolused with amiodarone Procainamide started IO placed, CVC placed later in the evening, Aline replaced Brady/hypotensive overnight, amio decreased. NE/Vaso resumed, now off of NE Patient intubated/lightly sedating, nodding appropriate to questions EP would like patient to be more sedated, increased ceiling on Versed to 15  Objective:  Blood pressure 116/68, pulse (!) 56, temperature 98.4 F (36.9 C), temperature source Axillary, resp. rate 19, height '5\' 7"'$  (1.702 m), weight 95.2 kg, SpO2 100 %. CVP:  [7 mmHg-17 mmHg] 7 mmHg  Vent Mode: PRVC FiO2 (%):  [50 %-100 %] 50 % Set Rate:  [18 bmp] 18 bmp Vt Set:  [520 mL] 520 mL PEEP:  [5 cmH20] 5 cmH20 Plateau Pressure:  [17 cmH20-19 cmH20] 19 cmH20   Intake/Output Summary (Last 24 hours) at 08/30/2022 1018 Last data filed at 08/30/2022 0700 Gross per 24 hour  Intake 1533.21 ml  Output --  Net 1533.21 ml    Filed Weights   08/28/22 1345 08/28/22 1751 08/29/22 0500  Weight: 101 kg 97.7 kg 95.2 kg   Physical Examination: General: Acutely ill-appearing middle-aged man in NAD. Intubated, lightly sedated. HEENT: [place]/AT, anicteric sclera, PERRL, moist mucous membranes. ETT/OGT in place. Neuro:  Awake, nodding appropriately to questions.  Responds to verbal stimuli. Following commands consistently. Moves all 4 extremities spontaneously. +Cough and +Gag  CV: Bradycardic to 50s, regular, no m/g/r. PULM: Breathing even and unlabored on vent (PEEP 5, FiO2 50%). Lung fields CTAB. GI: Soft, nontender, nondistended. Normoactive bowel sounds. Extremities: Trace bilateral symmetric LE edema noted. Skin: Warm/dry, no  rashes.  Assessment & Plan:  Recurrent ventricular storm  Shock in setting of ventricular storm > resolved  S/p procainamide load 9/19. No sign of ischemic cause on R/LHC, likely due to volume overload with PWCP 26. Patient with recurrent VT (monomorphic), shocked by ICD x 5. - Advanced HF, EP teams following - Continue amiodarone at reduced rate - Continue procainamide - Replete electrolytes as indicated, goal K > 4, Mg > 2 - Monitor I&Os - F/u urine studies - Avoid nephrotoxic agents as able - Ensure adequate renal perfusion  Acute respiratory failure with hypoxia requiring mechanical ventilation Patient remained on 2 L nasal cannula oxygen with frequent ICD shocks, he looked uncomfortable, started desatting. Decision was to proceed with endotracheal intubation and mechanical ventilation. - Reintubated 9/24 for increased need for sedation - Continue PEG protocol with Versed, Fentanyl - Continue full vent support (4-8cc/kg IBW) - Wean FiO2 for O2 sat > 90% - Daily WUA/SBT when clinically appropriate - VAP bundle - Pulmonary hygiene - PAD protocol for sedation: Fentanyl and Versed for goal RASS -1 to -2  Chronic systolic CHF Echo 5/91/6384 with EF 20-25%, global hypokinesis, Grade III diastolic dysfunction. - AHF team following, appreciate recs  ESRD on HD - Nephrology following - iHD versus CRRT per Nephro - Trend BMP - Replete electrolytes as indicated  Morbid obesity - Nutrition/RD following  Best Practice (right click and "Reselect all SmartList Selections" daily)   Diet/type: NPO DVT prophylaxis: systemic heparin GI prophylaxis: PPI Lines: Central line and Dialysis Catheter Foley:  N/A Code Status:  full code Last date of multidisciplinary goals of care discussion [Per primary team]  Critical care time:   The patient is critically ill with multiple organ system failure and requires high complexity decision making for assessment and support, frequent evaluation  and titration of therapies, advanced monitoring, review of radiographic studies and interpretation of complex data.   Critical Care Time devoted to patient care services, exclusive of separately billable procedures, described in this note is 36 minutes.  Lestine Mount, PA-C  Pulmonary & Critical Care 08/30/22 10:18 AM  Please see [email or web address] for pager details.  From 7A-7P if no response, please call [phone] After hours, please call ELink [phone]

## 2022-08-30 NOTE — Progress Notes (Addendum)
Patient ID: Victor Thompson, male   DOB: [birth year], 61 y.o.   MRN: [id number]     Advanced Heart Failure Rounding Note  PCP-Cardiologist: Loralie Champagne, MD   Subjective:    Admitted with VT storm.   9/24 multiple repeated shocks for VT monomorphic, reintubated, rebolused with amio, quinidine now on hold, started on procainamide.    Developed bradycardia and hypotension overnight. Amio dropped from 60>>30. NE and VP added back for BP support.   This morning, remains on amio 30 + Procainamide 1 mg/min. Currently sinus brady 21s. No further VT since last PM. Now off NE. Remains on VP 0.04. MAPs mid 61s.   Remains intubated and sedated. RN got CVP 10-12 overnight. CVP 8 on my read.   K 5.1 Mg 2.4 last PM    RHC/LHC (9/20):  Coronary Findings  Diagnostic Dominance: Right Left Anterior Descending  Ost LAD to Prox LAD lesion is 40% stenosed.    First Diagonal Branch  1st Diag lesion is 60% stenosed.    Left Circumflex  Mid Cx lesion is 30% stenosed.    Right Coronary Artery  Prox RCA lesion is 30% stenosed.    Intervention   No interventions have been documented.   Right Heart  Right Heart Pressures RHC Procedural Findings: Hemodynamics (mmHg) RA mean 14 RV 52/15 PA 53/27, mean 37 PCWP mean 26 LV 97/29 AO 103/64  Oxygen saturations: PA 76% AO 100%  Cardiac Output (Fick) 8.98  Cardiac Index (Fick) 3.96 PVR 2.8 WU    Objective:   Weight Range: 95.2 kg Body mass index is 32.87 kg/m.   Vital Signs:   Temp:  [97.9 F (36.6 C)-98.6 F (37 C)] 98.3 F (36.8 C) (09/24 2336) Pulse Rate:  [51-125] 54 (09/25 0700) Resp:  [10-26] 18 (09/25 0700) BP: (104-168)/(66-101) 133/96 (09/24 1820) SpO2:  [82 %-100 %] 100 % (09/25 0700) Arterial Line BP: (91-119)/(55-70) 107/62 (09/25 0700) FiO2 (%):  [60 %-100 %] 60 % (09/25 0110) Last BM Date :  (pt does not know)  Weight change: Filed Weights   08/28/22 1345 08/28/22 1751 08/29/22 0500  Weight: 101 kg 97.7 kg  95.2 kg    Intake/Output:   Intake/Output Summary (Last 24 hours) at 08/30/2022 0716 Last data filed at 08/30/2022 0700 Gross per 24 hour  Intake 1719.84 ml  Output --  Net 1719.84 ml      Physical Exam   CVP 8  General:  intubated and sedated  HEENT: + ETT normal Neck: supple. Thick neck, JVD not well visualized Carotids 2+ bilat; no bruits. No lymphadenopathy or thyromegaly appreciated. Cor: PMI nondisplaced. Regular rate & rhythm. No rubs, gallops or murmurs. Lungs: intubated and clear Abdomen: soft, nontender, nondistended. No hepatosplenomegaly. No bruits or masses. Good bowel sounds. Extremities: no cyanosis, clubbing, rash, trace b/l LE edema Neuro: alert & oriented x 3, cranial nerves grossly intact. moves all 4 extremities w/o difficulty. Affect pleasant.   Telemetry   Recurrent monomorphic VT overnight, currently SB 50s  (personally reviewed)   EKG    N/A  Labs    CBC Recent Labs    08/29/22 0637 08/29/22 2235 08/30/22 0301  WBC 7.1  --  7.1  NEUTROABS 5.3  --  6.6  HGB 10.9* 9.5* 9.8*  HCT 32.6* 28.0* 28.8*  MCV 97.0  --  96.3  PLT 213  --  366   Basic Metabolic Panel Recent Labs    08/29/22 0637 08/29/22 2051 08/29/22 2235 08/30/22 0301  NA 136  134* 131* 133*  K 4.4 5.3* 4.0 5.1  CL 95* 94*  --  94*  CO2 27 23  --  24  GLUCOSE 99 107*  --  147*  BUN 27* 38*  --  42*  CREATININE 6.40* 7.76*  --  8.31*  CALCIUM 8.9 8.9  --  8.8*  MG  --  2.4  --   --   PHOS 4.6  --   --  4.3   Liver Function Tests Recent Labs    08/29/22 2051 08/30/22 0301  AST 18 9*  ALT 15 14  ALKPHOS 56 54  BILITOT 0.4 0.3  PROT 6.6 6.3*  ALBUMIN 3.1* 2.8*   No results for input(s): "LIPASE", "AMYLASE" in the last 72 hours. Cardiac Enzymes No results for input(s): "CKTOTAL", "CKMB", "CKMBINDEX", "TROPONINI" in the last 72 hours.  BNP: BNP (last 3 results) Recent Labs    05/25/22 0705 08/16/22 0627 08/16/22 1257  BNP 1,355.4* QUANTITY NOT SUFFICIENT,  UNABLE TO PERFORM TEST 399.2*    ProBNP (last 3 results) No results for input(s): "PROBNP" in the last 8760 hours.   D-Dimer No results for input(s): "DDIMER" in the last 72 hours. Hemoglobin A1C No results for input(s): "HGBA1C" in the last 72 hours. Fasting Lipid Panel No results for input(s): "CHOL", "HDL", "LDLCALC", "TRIG", "CHOLHDL", "LDLDIRECT" in the last 72 hours. Thyroid Function Tests No results for input(s): "TSH", "T4TOTAL", "T3FREE", "THYROIDAB" in the last 72 hours.  Invalid input(s): "FREET3"   Other results:   Imaging    DG CHEST PORT 1 VIEW  Result Date: 08/29/2022 CLINICAL DATA:  Central line placement EXAM: PORTABLE CHEST 1 VIEW COMPARISON:  08/29/2022 FINDINGS: Endotracheal to, left dialysis catheter, pacer are unchanged. Interval placement of right internal jugular central line with the tip in the SVC. NG tube placement enters the stomach. Mild cardiomegaly, vascular congestion. Bibasilar opacities, left greater than right, increasing since prior study. No effusions or pneumothorax. IMPRESSION: Right central line placement with the tip in the SVC. No pneumothorax. Increasing bibasilar atelectasis or infiltrates, left greater than right. Cardiomegaly, vascular congestion Electronically Signed   By: Rolm Baptise M.D.   On: 08/29/2022 23:44   DG Abd Portable 1V  Result Date: 08/29/2022 CLINICAL DATA:  [id number].  Check NG tube placement. EXAM: PORTABLE ABDOMEN - 1 VIEW COMPARISON:  Portable chest earlier today. FINDINGS: Standard NGT is well inside the stomach, curving around within the proximal stomach and continuing medially with tip in the body of stomach. There is mild to moderate gastric air distention. Stool is seen in the flexures but the small bowel pattern is not evaluated since it is not in the field. The heart is enlarged. Perihilar vascular congestion is again seen and patchy infiltrates and small pleural effusion left base. There are degenerative changes of  the spine. IMPRESSION: NGT is well inside the stomach with the tip in the body of stomach, with mild to moderate gastric air distention. Cardiomegaly and perihilar vascular congestion are similar. Patchy infiltrates and small pleural effusion left lung base. Electronically Signed   By: Telford Nab M.D.   On: 08/29/2022 20:04   DG Chest Port 1 View  Result Date: 08/29/2022 CLINICAL DATA:  ET tube placement EXAM: PORTABLE CHEST 1 VIEW COMPARISON:  08/25/2022 FINDINGS: Endotracheal tube is 3.9 cm above the carina. Pacer wire and left central line remain in place, unchanged. Cardiomegaly, vascular congestion. No confluent opacities or effusions. IMPRESSION: Support devices as above. Cardiomegaly, vascular congestion. Electronically Signed  By: Rolm Baptise M.D.   On: 08/29/2022 19:18     Medications:     Scheduled Medications:  aspirin  81 mg Oral Daily   atorvastatin  80 mg Oral QHS   Chlorhexidine Gluconate Cloth  6 each Topical Q0600   cholecalciferol  5,000 Units Oral Daily   docusate  100 mg Per Tube BID   fentaNYL (SUBLIMAZE) injection  50 mcg Intravenous Once   heparin  5,000 Units Subcutaneous Q8H   pantoprazole (PROTONIX) IV  40 mg Intravenous QHS   polyethylene glycol  17 g Per Tube Daily   sodium chloride flush  10-40 mL Intracatheter Q12H    Infusions:  sodium chloride Stopped (08/26/22 1922)   amiodarone 30 mg/hr (08/30/22 0700)   anticoagulant sodium citrate     fentaNYL infusion INTRAVENOUS 200 mcg/hr (08/30/22 0700)   midazolam 10 mg/hr (08/30/22 0700)   norepinephrine (LEVOPHED) Adult infusion Stopped (08/30/22 0059)   procainamide (PRONESTYL) 1,000 mg in dextrose 5 % 250 mL (4 mg/mL) infusion 1 mg/min (08/30/22 0700)   vasopressin 0.04 Units/min (08/30/22 0700)    PRN Medications: acetaminophen, albuterol, alteplase, anticoagulant sodium citrate, fentaNYL, heparin, LORazepam, midazolam, mouth rinse, sodium chloride flush    Patient Profile   Mr Sandlin is 60  year old with a history of VT, ESRD, CAD, ischemic CMP, and ESRD.  He had PCI in 3382, uncertain what vessel was involved. Echo 2/22: EF 25-30% with moderate LV dilation and normal RV.  He had VT arrest in 2015, has [employer] subcutaneous ICD.  [name] in 2/22.  LHC/RHC was done in 8/22, showing no significant CAD and stable hemodynamics.   Admitted with refractory VT complicated by shock.    Assessment/Plan   1. VT storm: Has has a Animator ICD.  Refractory monomorphic VT. Multiple shocks.  Later alternating NSR with a slow wide complex AIVR-like rhythm. Recurrent monomorphic VT 9/24 w/ repeated shocks>>re-intubated, rebolused with amio, quinidine discontinued, started on procainamide.  This morning, on amio gtt at 30 mg/hr + procainamide 1 mg/min. Currently SB in the 66s. Cath on 9/20 with nonobstructive CAD. Echo this admit, EF 20-25%, GIIIDD (restrictive), RV normal. EP following  - Continue amiodarone gtt 30 mg/hr - Continue procainamide 1 mg/min - No further beta blocker with hypotension.  - Long term very concerning, not candidate for LVAD with ESRD.  Discussed with Dr. Quentin Ore and not candidate for VT ablation.  2. Acute on chronic systolic CHF with cardiogenic shock: In setting of incessant VT.  H/o NICM, echo this admission with EF 20-25%, normal RV function, mild-moderate MR, IVC dilated.  At last admission, unable to complete cardiac MRI due to artifact from [facility] ICD.  PYP scan was negative.  RHC this admit showed volume overload>>CRRT for volume removal, currently off. CVP 8 today  - Nephrology following. No immediate HD needs currently. Follow CVP closely   Currently off NE. On vasopressin 0.04, pressors required in setting of VT and sedation.  - Wean vasopressin as able today.    3. CAD: ?PCI in 2005, unsure what vessel.  Coronary angiography in 8/22 showed no significant CAD and also does not appear to show prior stent so h/o CAD is questionable. LHC this admission with  nonobstructive CAD.  - Continue ASA 81 and statin.  4. ESRD: Since 2/22. TTS HD.  - Currently off CRRT. May need to resume pending volume status and pressor requirements. No immediate HD needs currently. CVP 8. If able to  wean off pressors and extubate, will plan to resume iHD - nephrology following  5. Acute hypoxemic respiratory failure: In setting of VT storm.  - Wean sedation, hopefully extubate today.   Lyda Jester,  PA-C  08/30/2022 7:16 AM  Patient seen with PA, agree with the above note.   Recurrent VT with shocks and intubation yesterday.  Currently on vasopressin 0.03 and off NE.  No further VT, NSR in 50s.  Wakes up on vent.   CVP 8, co-ox 81%.    General: NAD, intubated Neck: JVP 8 cm, no thyromegaly or thyroid nodule.  Lungs: Clear to auscultation bilaterally with normal respiratory effort. CV: Nondisplaced PMI.  Heart regular S1/S2, no S3/S4, no murmur.  No peripheral edema.   Abdomen: Soft, nontender, no hepatosplenomegaly, no distention.  Skin: Intact without lesions or rashes.  Neurologic: Wakes up on vent Extremities: No clubbing or cyanosis.  HEENT: Normal.   Further VT, now on procainamide and amiodarone 30.  Long-term plan unclear, not VT ablation candidate.  We have not ruled out (or in) cardiac sarcoidosis.  Unable to get MRI due to position of his ICD.   - Start empiric steroids with prednisone 40 mg daily for now.   - Would like eventual cardiac PET for sarcoidosis but can't do at this center.  - Would get CT chest when more stable to look for evidence of pulmonary sarcoidosis.   Hopefully extubate today, awake on vent.   TTS HD, would be due tomorrow. Hopefully can get him off vasopressin today and avoid need to restart CVVH.   CRITICAL CARE Performed by: Loralie Champagne  Total critical care time: 35 minutes  Critical care time was exclusive of separately billable procedures and treating other patients.  Critical care was necessary to treat or  prevent imminent or life-threatening deterioration.  Critical care was time spent personally by me on the following activities: development of treatment plan with patient and/or surrogate as well as nursing, discussions with consultants, evaluation of patient's response to treatment, examination of patient, obtaining history from patient or surrogate, ordering and performing treatments and interventions, ordering and review of laboratory studies, ordering and review of radiographic studies, pulse oximetry and re-evaluation of patient's condition.  Loralie Champagne 08/30/2022 8:18 AM

## 2022-08-30 NOTE — Progress Notes (Addendum)
Electrophysiology Rounding Note  [name] Date of Encounter: 08/30/2022  Primary Cardiologist: Loralie Champagne, MD Electrophysiologist: Cristopher Peru, MD   Subjective   Intubated and sedated, RN reports follows all commands.  Fiancee at bedside  Inpatient Medications    Scheduled Meds:  aspirin  81 mg Oral Daily   atorvastatin  80 mg Oral QHS   Chlorhexidine Gluconate Cloth  6 each Topical Q0600   cholecalciferol  5,000 Units Oral Daily   docusate  100 mg Per Tube BID   fentaNYL (SUBLIMAZE) injection  50 mcg Intravenous Once   heparin  5,000 Units Subcutaneous Q8H   mouth rinse  15 mL Mouth Rinse Q2H   pantoprazole (PROTONIX) IV  40 mg Intravenous QHS   polyethylene glycol  17 g Per Tube Daily   predniSONE  40 mg Per Tube Q breakfast   sodium chloride flush  10-40 mL Intracatheter Q12H   Continuous Infusions:  sodium chloride Stopped (08/26/22 1922)   amiodarone 30 mg/hr (08/30/22 0700)   anticoagulant sodium citrate     fentaNYL infusion INTRAVENOUS 200 mcg/hr (08/30/22 0700)   midazolam 10 mg/hr (08/30/22 0700)   norepinephrine (LEVOPHED) Adult infusion Stopped (08/30/22 0059)   procainamide (PRONESTYL) 1,000 mg in dextrose 5 % 250 mL (4 mg/mL) infusion 1 mg/min (08/30/22 0700)   vasopressin 0.04 Units/min (08/30/22 0700)   PRN Meds: acetaminophen, albuterol, alteplase, anticoagulant sodium citrate, fentaNYL, heparin, LORazepam, midazolam, mouth rinse, mouth rinse, sodium chloride flush   Vital Signs    Vitals:   08/30/22 0500 08/30/22 0600 08/30/22 0700 08/30/22 0800  BP:      Pulse: (!) 53 62 (!) 54 (!) 55  Resp: '18 18 18 19  '$ Temp:      TempSrc:      SpO2: 100% 100% 100% 100%  Weight:      Height:        Intake/Output Summary (Last 24 hours) at 08/30/2022 0809 Last data filed at 08/30/2022 0700 Gross per 24 hour  Intake 1566.59 ml  Output --  Net 1566.59 ml   Filed Weights   08/28/22 1345 08/28/22 1751 08/29/22 0500  Weight: 101  kg 97.7 kg 95.2 kg    Physical Exam    GEN- The patient is intubated, sedated.  Morbidly obese  HEENT- + ET tube.  Lungs- in vent, clear b/l Heart- RRR, no murmurs, rubs or gallops GI- soft Extremities- no clubbing or cyanosis. No edema Skin- no rash or lesion Neuro- intubated, RN reports following commands  Labs    CBC Recent Labs    08/29/22 0637 08/29/22 2235 08/30/22 0301  WBC 7.1  --  7.1  NEUTROABS 5.3  --  6.6  HGB 10.9* 9.5* 9.8*  HCT 32.6* 28.0* 28.8*  MCV 97.0  --  96.3  PLT 213  --  854   Basic Metabolic Panel Recent Labs    08/29/22 0637 08/29/22 2051 08/29/22 2235 08/30/22 0301  NA 136 134* 131* 133*  K 4.4 5.3* 4.0 5.1  CL 95* 94*  --  94*  CO2 27 23  --  24  GLUCOSE 99 107*  --  147*  BUN 27* 38*  --  42*  CREATININE 6.40* 7.76*  --  8.31*  CALCIUM 8.9 8.9  --  8.8*  MG  --  2.4  --   --   PHOS 4.6  --   --  4.3   Liver Function Tests Recent Labs    08/29/22 2051 08/30/22  0301  AST 18 9*  ALT 15 14  ALKPHOS 56 54  BILITOT 0.4 0.3  PROT 6.6 6.3*  ALBUMIN 3.1* 2.8*   No results for input(s): "LIPASE", "AMYLASE" in the last 72 hours. Cardiac Enzymes No results for input(s): "CKTOTAL", "CKMB", "CKMBINDEX", "TROPONINI" in the last 72 hours.   Telemetry    SB 50's, no VT since about 1800 yesterday (personally reviewed)  Radiology     08/25/22: [facility] Left Anterior Descending  Ost LAD to Prox LAD lesion is 40% stenosed.    First Diagonal Branch  1st Diag lesion is 60% stenosed.    Left Circumflex  Mid Cx lesion is 30% stenosed.    Right Coronary Artery  Prox RCA lesion is 30% stenosed.     Right Heart Pressures RHC Procedural Findings: Hemodynamics (mmHg) RA mean 14 RV 52/15 PA 53/27, mean 37 PCWP mean 26 LV 97/29 AO 103/64  Oxygen saturations: PA 76% AO 100%  Cardiac Output (Fick) 8.98  Cardiac Index (Fick) 3.96 PVR 2.8 WU    08/25/2022: TTE  1. Left ventricular ejection fraction, by estimation, is 20 to  25%. The  left ventricle has severely decreased function. The left ventricle  demonstrates global hypokinesis. The left ventricular internal cavity size  was severely dilated. Left ventricular  diastolic parameters are consistent with Grade III diastolic dysfunction  (restrictive). Elevated left ventricular end-diastolic pressure.   2. Right ventricular systolic function is normal. The right ventricular  size is mildly enlarged. There is mildly elevated pulmonary artery  systolic pressure.   3. Left atrial size was moderately dilated.   4. Right atrial size was mildly dilated.   5. The mitral valve is normal in structure. Mild to moderate mitral valve  regurgitation. No evidence of mitral stenosis.   6. The aortic valve is grossly normal. Aortic valve regurgitation is not  visualized. No aortic stenosis is present.   7. The inferior vena cava is dilated in size with <50% respiratory  variability, suggesting right atrial pressure of 15 mmHg.   8. Cannot exclude a small PFO.   Comparison(s): Changes from prior study are noted. LV chamber now severely  dilated (7 cm).    10/21/21: TTE  1. Left ventricular ejection fraction, by estimation, is 25 to 30%. The  left ventricle has severely decreased function. The left ventricle  demonstrates global hypokinesis. The left ventricular internal cavity size  was moderately dilated. Left  ventricular diastolic parameters are consistent with Grade I diastolic  dysfunction (impaired relaxation).   2. Right ventricular systolic function is normal. The right ventricular  size is normal. There is normal pulmonary artery systolic pressure. The  estimated right ventricular systolic pressure is 09.8 mmHg.   3. The mitral valve is normal in structure. Mild mitral valve  regurgitation. No evidence of mitral stenosis.   4. The aortic valve is tricuspid. Aortic valve regurgitation is not  visualized. No aortic stenosis is present.   5. The inferior vena  cava is normal in size with greater than 50%  respiratory variability, suggesting right atrial pressure of 3 mmHg.    07/08/21; LHC 1. Normal filling pressures and normal cardiac output.  2. No significant CAD.  Nonischemic cardiomyopathy.   Patient Profile     Victor Thompson is a 61 y.o. male with a hx of  hx of VF arrest (2015), CAD (PCI in 2005, unknown details), ICM,. Chronic CHF (systolic), ESRF on HD , OSA w/CPAP, HTN, HLD, recurrent angio edema who was admitted  for another VT storm.    Assessment & Plan    1.  VT storm / refractory VT Mor VT this weekend Shocked again yesterday Re-intubated and sedated Amio gtt at 30 Failed quinidine Now on procainamide '1mg'$ /min Has angioedema with lidocaine so unable to use, would not try mexiletine    2. Chronic systolic CHF Coronary angiography in 07/2021 showed no significant CAD, and also did not appear to show prior "stent" which had been reported.  R/LHC 08/25/2022 with Moderate non obstructive CAD and elevated filling pressures as above.  PYP scan negative  Had been planned for PET as outpatient for possible Sarcoid.  ECG with IVCD 150 msec, thought not to be good candidate for CRT upgrade (not true LBBB) Back on pressors overnight > levo is off, still on vasopressin Off BB  Planned for a trial of steroids if perhaps/possible sarcoid playing a role  3. ESRD Nephrology following.  T/TH/sat Off CRRT   4. Acute hypoxemic respiratory failure Re-intubated, sedated Appreciate HF and CCM care.  Will d/w Dr. Kallie Locks Perhaps give him today on vent if no mor VT perhaps weaning trial tomorrow?   For questions or updates, please contact [place] Please consult [email or web address] for contact info under Cardiology/STEMI.  Signed, Baldwin Jamaica, PA-C  08/30/2022, 8:09 AM   Numerous discussions with CHF and CCM and amongst EP service  His situation is dire and recurrent VT storm is likely as we have no oral drug options which  he has not been on.  I am not sure if po Procainamide is available and he failed quinidine, and it should be the case that he had relatively full K and Na blocking effects.  The Intrisicoid deflection of his VT is very late raising concern about exit site and that it is likely midmyocardial or epicardial.  Moreover after discussions with Dr DM today he observed that the VT had various rates, slowing and speeding and recurring in short and longer bursts which suggests automaticity as opposed to reentry and this would require mapping, not of substrate at rest but during VT.  DM observations also were that BP was stable in VT ( surprisingly to me)    The EP plan tentatively is to take for high risk VT ablation as the options are so limited in this young man.  ? Thursday and would keep intubated and sedated until then for fear of more shocks whille awake ( EVEN if intubated)  Will stop procainamide as we have no way to measure levels and he is at high risk for inability to clear with renal and hepatic issues

## 2022-08-30 NOTE — Progress Notes (Signed)
[facility] Progress Note   Assessment/ Plan:    [facility]  4h 73mn  450/ 1.5  116.4kg  AVF  15g  Hep 3000 - hectorol 7 ug iv tiw - no esa     Assessment/ Plan: VT storm / VF - pt rx'd w/ IV amio, po quinidine, sedation / intubation, paralytics and procainamide. Had more VT/ shocks/ amio.  On IV amio and procainamide Shock/ hypotension - back on pressors, going to try to wean vaso today per cardiology ESRD - on HD TTS. SP CRRT 9/20- 9/22. SP regular HD Saturday. Next HD 9/26 if can tolerate. Will need to remove temp HD cath when no longer using (IV amiodarone).  HFrEF / EF 20-25%/ sp ICD - not LVAD candidate due to esrd Volume - much improved, weights seem off yest and today. Looks euvolemic.  Anemia esrd - Hb 10-12, no esa needs for now MBD ckd - CCa in range, phos a bit high.  Will resume auryxia 1 ac tid for now (usual dose is 2 ac). Phos is 4-5 range.   Subjective:    Seen in room.  Had multiple shocks from AICD last night d/t VT and amio boluses.     Objective:   BP 116/68   Pulse (!) 56   Temp 98.4 F (36.9 C) (Axillary)   Resp 19   Ht '5\' 7"'$  (1.702 m)   Wt 95.2 kg   SpO2 100%   BMI 32.87 kg/m   Physical Exam: [id number] sedated CVS: borderline brady Resp: clear Abd: soft Ext: trace LE edema ACCESS: L AVF + T/B  Labs: BMET Recent Labs  Lab 08/25/22 2122 08/26/22 0550 08/26/22 1557 08/27/22 0430 08/28/22 0918 08/29/22 0637 08/29/22 2051 08/29/22 2235 08/30/22 0301  NA 135 134* 134* 136 134* 136 134* 131* 133*  K 4.7 5.2* 5.0 4.6 4.6 4.4 5.3* 4.0 5.1  CL 95* 98 102 101 97* 95* 94*  --  94*  CO2 '22 26 27 26 25 27 23  '$ --  24  GLUCOSE 122* 109* 104* 97 102* 99 107*  --  147*  BUN 38* 32* 24* 20 33* 27* 38*  --  42*  CREATININE 8.94* 7.35* 6.08* 4.65* 6.94* 6.40* 7.76*  --  8.31*  CALCIUM 7.9* 8.3* 7.9* 8.2* 8.5* 8.9 8.9  --  8.8*  PHOS 5.6* 4.7* 3.9 3.3 4.8* 4.6  --   --  4.3   CBC Recent Labs  Lab 08/27/22 0430  08/28/22 0702 08/29/22 0637 08/29/22 2235 08/30/22 0301  WBC 9.2 8.2 7.1  --  7.1  NEUTROABS 7.8* 6.0 5.3  --  6.6  HGB 10.0* 12.0* 10.9* 9.5* 9.8*  HCT 29.8* 35.6* 32.6* 28.0* 28.8*  MCV 100.0 99.4 97.0  --  96.3  PLT 162 168 213  --  197      Medications:     aspirin  81 mg Oral Daily   atorvastatin  80 mg Oral QHS   Chlorhexidine Gluconate Cloth  6 each Topical Q0600   cholecalciferol  5,000 Units Oral Daily   docusate  100 mg Per Tube BID   fentaNYL (SUBLIMAZE) injection  50 mcg Intravenous Once   heparin  5,000 Units Subcutaneous Q8H   mouth rinse  15 mL Mouth Rinse Q2H   pantoprazole  40 mg Per Tube Daily   polyethylene glycol  17 g Per Tube Daily   predniSONE  40 mg Per Tube Q breakfast   sodium chloride flush  10-40 mL Intracatheter Q12H     Madelon Lips MD 08/30/2022, 10:44 AM

## 2022-08-30 NOTE — Progress Notes (Signed)
eLink Physician-Brief Progress Note [name] DOB: 01/21/1961 MRN: [id number]   Date of Service  08/30/2022  HPI/Events of Note  CXR and ABG post intubation reviewed.   Adequate placement of ETT and R IJ cath.   ABG 7.411/42/335.  eICU Interventions  Titrate FiO2 accordingly.      Intervention Category Intermediate Interventions: Diagnostic test evaluation  Elsie Lincoln 08/30/2022, 5:52 AM

## 2022-08-30 NOTE — Progress Notes (Signed)
Initial Nutrition Assessment  DOCUMENTATION CODES:   Not applicable  INTERVENTION:   Tube Feeding via OG:  Vital 1.5 at 50 ml/hr Pro-Source TF20 60 mL BID This provides 121 g of protein, 1960 kcals and 912 mL of free water  Add Renal MVI daily  +Constipation with no BM since admission, consider increasing bowel regimen/checking for impaction if continues without BM   NUTRITION DIAGNOSIS:   Inadequate oral intake related to acute illness as evidenced by NPO status.   GOAL:   Patient will meet greater than or equal to 90% of their needs   MONITOR:   Vent status, Labs, Weight trends, TF tolerance, Skin, I & O's  REASON FOR ASSESSMENT:   Ventilator    ASSESSMENT:   61 yo male admitted with recurrent VT storm with multiple ICD shocks requiring intubation. PMH includes ESRD on HD, CHF, CAD, HTN, HLD, ICD  9/19 Intubated 9/20 CRRT initiated 9/22 CRRT discontinued, Extubated 9/23 Received iHD 9/24 Multiple repeated shocks for VT, Intubated  Pt remains on vent support, off levophed, remains on vasopressin Net iHD on 9/26 if pt able to tolerate  Recorded po intake 10-50% of meals post extubation and prior to reintubation. Around 25% of meals on Renal/Carb Mod diet. Pt did not receive TF with previous intubation.   OG tube in stomach per abd xray  No documented BM since admission  EDW 116.4 kg; current wt 95 kg.Pt is below outpatient EDW at this time. If weights are accurate, EDW will need to be adjusted at discharge  Labs: sodium 133 (L), potassium 5.1 (wdl) Meds: prednisone, Vit D3, colace, miralax, prednisone    Diet Order:   Diet Order             Diet NPO time specified  Diet effective now                   EDUCATION NEEDS:   Not appropriate for education at this time  Skin:  Skin Assessment: Reviewed RN Assessment  Last BM:  PTA  Height:   Ht Readings from Last 1 Encounters:  08/29/22 '5\' 7"'$  (1.702 m)    Weight:   Wt Readings from  Last 1 Encounters:  08/29/22 95.2 kg    BMI:  Body mass index is 32.87 kg/m.  Estimated Nutritional Needs:   Kcal:  1850-2150 kcals  Protein:  100-135 g  Fluid:  1000 mL plus UOP   Kerman Passey MS, RDN, LDN, CNSC Registered Dietitian 3 Clinical Nutrition RD Pager and On-Call Pager Number Located in [place]

## 2022-08-30 NOTE — Progress Notes (Signed)
PT Cancellation Note  Patient Details Name: Victor Thompson MRN: [id number] DOB: 1961/03/06   Cancelled Treatment:    Reason Eval/Treat Not Completed: Patient not medically ready Holding PT today as pt re-intubated 9/24 due to multiple shocks needed and now sedated. Will follow.   Victor Thompson 08/30/2022, 7:39 AM Marisa Severin, PT, DPT Acute Rehabilitation Services Secure chat preferred Office [phone]

## 2022-08-30 NOTE — Progress Notes (Signed)
OT Cancellation Note  Patient Details Name: KELLAR WESTBERG MRN: [id number] DOB: June 13, 1961   Cancelled Treatment:    Reason Eval/Treat Not Completed: Patient not medically ready (Now intubated adn sedated. Will check back another time.)  [facility] 08/30/2022, 7:48 AM Maurie Boettcher, OT/L   Acute OT Clinical Specialist Acute Rehabilitation Services Pager [phone] Office [phone]

## 2022-08-30 NOTE — TOC Initial Note (Signed)
Transition of Care [facility]) - Initial/Assessment Note    Patient Details  Name: Victor Thompson MRN: [id number] Date of Birth: [birth year]  Transition of [facility]) CM/SW Contact:    Erenest Rasher, RN Phone Number: [phone] 08/30/2022, 3:35 PM  Clinical Narrative:                 Pt readmission. Pt lives in home with cousin. Waiting PT/OT recommendations for home. Will continue to follow for dc needs.   Expected Discharge Plan: Skilled Nursing Facility Barriers to Discharge: Continued Medical Work up   Patient Goals and CMS Choice        Expected Discharge Plan and Services Expected Discharge Plan: [place]   Discharge Planning Services: CM Consult   Living arrangements for the past 2 months: Single Family Home                                      Prior Living Arrangements/Services Living arrangements for the past 2 months: Single Family Home Lives with:: Relatives Patient language and need for interpreter reviewed:: Yes                 Activities of Daily Living Home Assistive Devices/Equipment: None ADL Screening (condition at time of admission) Patient's cognitive ability adequate to safely complete daily activities?: Yes Is the patient deaf or have difficulty hearing?: No Does the patient have difficulty seeing, even when wearing glasses/contacts?: No Does the patient have difficulty concentrating, remembering, or making decisions?: No Patient able to express need for assistance with ADLs?: Yes Does the patient have difficulty dressing or bathing?: No Independently performs ADLs?: Yes (appropriate for developmental age) Does the patient have difficulty walking or climbing stairs?: No Weakness of Legs: None Weakness of Arms/Hands: None  Permission Sought/Granted Permission sought to share information with : Case Manager, Family Supports, PCP    Share Information with NAME: Cedrik Heindl     Permission granted to share info  w Relationship: cousin  Permission granted to share info w Contact Information: [phone]  Emotional Assessment              Admission diagnosis:  Paroxysmal ventricular tachycardia ([place]) [I47.29] Ventricular tachyarrhythmia ([place]) [I47.20] Patient Active Problem List   Diagnosis Date Noted   Paroxysmal ventricular tachycardia ([place]) 08/24/2022   VT (ventricular tachycardia) ([place]) 08/16/2022   Chronic obstructive pulmonary disease, unspecified COPD type ([place]) 06/18/2022   Acute respiratory failure with hypoxia ([place]) 05/28/2022   AICD discharge 05/25/2022   Seasonal and perennial allergic rhinitis 11/12/2021   Moderate persistent asthma, uncomplicated 65/78/4696   STD exposure 08/27/2021   ESRD on dialysis ([place]) 02/06/2021   Hypervolemia associated with renal insufficiency 01/12/2021   Angioedema 09/22/2020   Acute on chronic HFrEF (heart failure with reduced ejection fraction) ([employer]) 06/14/2020   Secondary hyperparathyroidism of renal origin ([place]) 07/15/2019   Vitamin D deficiency 07/15/2019   CAD (coronary artery disease), native coronary artery 04/10/2019   Chronic midline low back pain without sciatica 02/17/2018   Ventricular tachycardia ([place]) 03/29/2016   Defibrillator discharge    HFrEF (heart failure with reduced ejection fraction) ([place]) 01/22/2016   Erectile dysfunction 01/22/2016   Allergic rhinitis 01/22/2016   GERD (gastroesophageal reflux disease) 01/22/2016   PROTEINURIA 02/17/2010   Dilated cardiomyopathy ([place]) 02/16/2010   Obstructive sleep apnea 02/16/2010   EXTERNAL HEMORRHOIDS 09/26/2009   Tobacco use disorder in  remission 06/03/2009   Essential hypertension 10/01/2008   Hyperlipidemia 07/14/2007   Class 3 severe obesity due to excess calories with serious comorbidity and body mass index (BMI) of 40.0 to 44.9 in adult [facility]) 07/14/2007   Asthma 07/14/2007   PCP:  [name], MD Pharmacy:   [facility] [address], [place] [address] Phone: [phone] Fax: [phone]  [facility] - [place], [place] [address] Phone: [phone] Fax: [phone]     Social Determinants of Health (SDOH) Interventions    Readmission Risk Interventions     No data to display

## 2022-08-31 LAB — CBC WITH DIFFERENTIAL/PLATELET
Abs Immature Granulocytes: 0.07 10*3/uL (ref 0.00–0.07)
Basophils Absolute: 0 10*3/uL (ref 0.0–0.1)
Basophils Relative: 0 %
Eosinophils Absolute: 0 10*3/uL (ref 0.0–0.5)
Eosinophils Relative: 0 %
HCT: 29.2 % — ABNORMAL LOW (ref 39.0–52.0)
Hemoglobin: 9.9 g/dL — ABNORMAL LOW (ref 13.0–17.0)
Immature Granulocytes: 1 %
Lymphocytes Relative: 2 %
Lymphs Abs: 0.3 10*3/uL — ABNORMAL LOW (ref 0.7–4.0)
MCH: 33.1 pg (ref 26.0–34.0)
MCHC: 33.9 g/dL (ref 30.0–36.0)
MCV: 97.7 fL (ref 80.0–100.0)
Monocytes Absolute: 0.9 10*3/uL (ref 0.1–1.0)
Monocytes Relative: 8 %
Neutro Abs: 10.4 10*3/uL — ABNORMAL HIGH (ref 1.7–7.7)
Neutrophils Relative %: 89 %
Platelets: 180 10*3/uL (ref 150–400)
RBC: 2.99 MIL/uL — ABNORMAL LOW (ref 4.22–5.81)
RDW: 15.6 % — ABNORMAL HIGH (ref 11.5–15.5)
WBC: 11.7 10*3/uL — ABNORMAL HIGH (ref 4.0–10.5)
nRBC: 0 % (ref 0.0–0.2)

## 2022-08-31 LAB — RENAL FUNCTION PANEL
Albumin: 3 g/dL — ABNORMAL LOW (ref 3.5–5.0)
Albumin: 2.9 g/dL — ABNORMAL LOW (ref 3.5–5.0)
Anion gap: 15 (ref 5–15)
Anion gap: 16 — ABNORMAL HIGH (ref 5–15)
BUN: 75 mg/dL — ABNORMAL HIGH (ref 6–20)
BUN: 79 mg/dL — ABNORMAL HIGH (ref 6–20)
CO2: 22 mmol/L (ref 22–32)
CO2: 21 mmol/L — ABNORMAL LOW (ref 22–32)
Calcium: 8.5 mg/dL — ABNORMAL LOW (ref 8.9–10.3)
Calcium: 8.2 mg/dL — ABNORMAL LOW (ref 8.9–10.3)
Chloride: 93 mmol/L — ABNORMAL LOW (ref 98–111)
Chloride: 96 mmol/L — ABNORMAL LOW (ref 98–111)
Creatinine, Ser: 10.52 mg/dL — ABNORMAL HIGH (ref 0.61–1.24)
Creatinine, Ser: 9.25 mg/dL — ABNORMAL HIGH (ref 0.61–1.24)
GFR, Estimated: 5 mL/min — ABNORMAL LOW (ref >60)
GFR, Estimated: 6 mL/min — ABNORMAL LOW (ref >60)
Glucose, Bld: 138 mg/dL — ABNORMAL HIGH (ref 70–99)
Glucose, Bld: 145 mg/dL — ABNORMAL HIGH (ref 70–99)
Phosphorus: 8 mg/dL — ABNORMAL HIGH (ref 2.5–4.6)
Phosphorus: 7.2 mg/dL — ABNORMAL HIGH (ref 2.5–4.6)
Potassium: 5.3 mmol/L — ABNORMAL HIGH (ref 3.5–5.1)
Potassium: 5.1 mmol/L (ref 3.5–5.1)
Sodium: 130 mmol/L — ABNORMAL LOW (ref 135–145)
Sodium: 133 mmol/L — ABNORMAL LOW (ref 135–145)

## 2022-08-31 LAB — MISC LABCORP TEST (SEND OUT): Labcorp test code: 7252

## 2022-08-31 LAB — MAGNESIUM: Magnesium: 2.3 mg/dL (ref 1.7–2.4)

## 2022-08-31 LAB — GLUCOSE, CAPILLARY
Glucose-Capillary: 127 mg/dL — ABNORMAL HIGH (ref 70–99)
Glucose-Capillary: 131 mg/dL — ABNORMAL HIGH (ref 70–99)
Glucose-Capillary: 128 mg/dL — ABNORMAL HIGH (ref 70–99)
Glucose-Capillary: 139 mg/dL — ABNORMAL HIGH (ref 70–99)
Glucose-Capillary: 158 mg/dL — ABNORMAL HIGH (ref 70–99)

## 2022-08-31 LAB — HEPARIN LEVEL (UNFRACTIONATED): Heparin Unfractionated: 0.4 IU/mL (ref 0.30–0.70)

## 2022-08-31 MED ORDER — PRISMASOL BGK 4/2.5 32-4-2.5 MEQ/L REPLACEMENT SOLN: Status: DC

## 2022-08-31 MED ORDER — HEPARIN (PORCINE) 25000 UT/250ML-% IV SOLN
950.0000 [IU]/h | INTRAVENOUS | Status: DC
  Administered 2022-08-31: 1000 [IU]/h via INTRAVENOUS
  Filled 2022-08-31 (×2): qty 250

## 2022-08-31 MED ORDER — PRISMASOL BGK 4/2.5 32-4-2.5 MEQ/L EC SOLN: Status: DC

## 2022-08-31 MED ORDER — ASPIRIN 81 MG PO CHEW: 81.0000 mg | CHEWABLE_TABLET | Freq: Every day | ORAL | Status: DC

## 2022-08-31 MED ORDER — ATORVASTATIN CALCIUM 80 MG PO TABS
80.0000 mg | ORAL_TABLET | Freq: Every day | ORAL | Status: DC
  Administered 2022-08-31 – 2022-09-01 (×2): 80 mg
  Filled 2022-08-31 (×2): qty 1

## 2022-08-31 MED ORDER — PROPOFOL 1000 MG/100ML IV EMUL
0.0000 ug/kg/min | INTRAVENOUS | Status: DC
  Administered 2022-08-31: 5 ug/kg/min via INTRAVENOUS
  Administered 2022-08-31: 10 ug/kg/min via INTRAVENOUS
  Administered 2022-09-01: 15 ug/kg/min via INTRAVENOUS
  Administered 2022-09-01: 10 ug/kg/min via INTRAVENOUS
  Administered 2022-09-01 – 2022-09-02 (×2): 30 ug/kg/min via INTRAVENOUS
  Administered 2022-09-02: 20 ug/kg/min via INTRAVENOUS
  Filled 2022-08-31: qty 100
  Filled 2022-08-31: qty 200
  Filled 2022-08-31 (×4): qty 100

## 2022-08-31 NOTE — Progress Notes (Signed)
PT Cancellation Note  Patient Details Name: Victor Thompson MRN: [id number] DOB: 1961-04-02   Cancelled Treatment:    Reason Eval/Treat Not Completed: Patient not medically ready (sedated on vent and not medically appropriate)   [name] [name] 08/31/2022, 12:44 PM [name], [place] Office: [phone]

## 2022-08-31 NOTE — Progress Notes (Signed)
Electrophysiology Rounding Note  [name] Date of Encounter: 08/31/2022  Primary Cardiologist: Loralie Champagne, MD Electrophysiologist: Cristopher Peru, MD   Subjective   Intubated and sedated, RN reports follows all commands.   Inpatient Medications    Scheduled Meds:  aspirin  81 mg Oral Daily   atorvastatin  80 mg Oral QHS   Chlorhexidine Gluconate Cloth  6 each Topical Q0600   cholecalciferol  5,000 Units Oral Daily   docusate  100 mg Per Tube BID   feeding supplement (PROSource TF20)  60 mL Per Tube BID   fentaNYL (SUBLIMAZE) injection  50 mcg Intravenous Once   heparin  5,000 Units Subcutaneous Q8H   multivitamin  1 tablet Per Tube QHS   mouth rinse  15 mL Mouth Rinse Q2H   pantoprazole  40 mg Per Tube Daily   polyethylene glycol  17 g Per Tube Daily   predniSONE  40 mg Per Tube Q breakfast   sodium chloride flush  10-40 mL Intracatheter Q12H   Continuous Infusions:  sodium chloride Stopped (08/26/22 1922)   amiodarone 30.006 mg/hr (08/31/22 0100)   anticoagulant sodium citrate     feeding supplement (VITAL 1.5 CAL) 40 mL/hr at 08/31/22 0500   fentaNYL infusion INTRAVENOUS 200 mcg/hr (08/31/22 0637)   midazolam 15 mg/hr (08/31/22 0800)   vasopressin 0.04 Units/min (08/31/22 0308)   PRN Meds: acetaminophen, albuterol, alteplase, anticoagulant sodium citrate, fentaNYL, heparin, LORazepam, midazolam, mouth rinse, mouth rinse, sodium chloride flush   Vital Signs    Vitals:   08/31/22 0400 08/31/22 0500 08/31/22 0600 08/31/22 0700  BP:      Pulse: 68 66 63   Resp: '15 17 18   '$ Temp:    98.1 F (36.7 C)  TempSrc:    Axillary  SpO2: 97% 98% 97%   Weight:  96.3 kg    Height:        Intake/Output Summary (Last 24 hours) at 08/31/2022 0836 Last data filed at 08/30/2022 1800 Gross per 24 hour  Intake 890.78 ml  Output --  Net 890.78 ml   Filed Weights   08/28/22 1751 08/29/22 0500 08/31/22 0500  Weight: 97.7 kg 95.2 kg 96.3 kg     Physical Exam    GEN- The patient is intubated, sedated.  [place], follows commands, Morbidly obese  HEENT- + ET tube.  Lungs- in vent, clear b/l Heart- RRR, no murmurs, rubs or gallops GI- soft Extremities- no clubbing or cyanosis. No edema Skin- no rash or lesion Neuro- intubated, RN reports following commands  Labs    CBC Recent Labs    08/30/22 0301 08/31/22 0450  WBC 7.1 11.7*  NEUTROABS 6.6 10.4*  HGB 9.8* 9.9*  HCT 28.8* 29.2*  MCV 96.3 97.7  PLT 197 324   Basic Metabolic Panel Recent Labs    08/30/22 0301 08/30/22 1610 08/31/22 0450  NA 133*  --  130*  K 5.1  --  5.3*  CL 94*  --  93*  CO2 24  --  22  GLUCOSE 147*  --  138*  BUN 42*  --  75*  CREATININE 8.31*  --  10.52*  CALCIUM 8.8*  --  8.5*  MG 2.2 2.2 2.3  PHOS 4.3 6.7* 8.0*   Liver Function Tests Recent Labs    08/29/22 2051 08/30/22 0301 08/31/22 0450  AST 18 9*  --   ALT 15 14  --   ALKPHOS 56 54  --   BILITOT 0.4 0.3  --  PROT 6.6 6.3*  --   ALBUMIN 3.1* 2.8* 3.0*   No results for input(s): "LIPASE", "AMYLASE" in the last 72 hours. Cardiac Enzymes No results for input(s): "CKTOTAL", "CKMB", "CKMBINDEX", "TROPONINI" in the last 72 hours.   Telemetry    SB/SR 50's- 70s, no VT since about 1800 on 9/24 (personally reviewed)  Radiology     08/25/22: [facility] Left Anterior Descending  Ost LAD to Prox LAD lesion is 40% stenosed.    First Diagonal Branch  1st Diag lesion is 60% stenosed.    Left Circumflex  Mid Cx lesion is 30% stenosed.    Right Coronary Artery  Prox RCA lesion is 30% stenosed.     Right Heart Pressures RHC Procedural Findings: Hemodynamics (mmHg) RA mean 14 RV 52/15 PA 53/27, mean 37 PCWP mean 26 LV 97/29 AO 103/64  Oxygen saturations: PA 76% AO 100%  Cardiac Output (Fick) 8.98  Cardiac Index (Fick) 3.96 PVR 2.8 WU    08/25/2022: TTE  1. Left ventricular ejection fraction, by estimation, is 20 to 25%. The  left ventricle has severely  decreased function. The left ventricle  demonstrates global hypokinesis. The left ventricular internal cavity size  was severely dilated. Left ventricular  diastolic parameters are consistent with Grade III diastolic dysfunction  (restrictive). Elevated left ventricular end-diastolic pressure.   2. Right ventricular systolic function is normal. The right ventricular  size is mildly enlarged. There is mildly elevated pulmonary artery  systolic pressure.   3. Left atrial size was moderately dilated.   4. Right atrial size was mildly dilated.   5. The mitral valve is normal in structure. Mild to moderate mitral valve  regurgitation. No evidence of mitral stenosis.   6. The aortic valve is grossly normal. Aortic valve regurgitation is not  visualized. No aortic stenosis is present.   7. The inferior vena cava is dilated in size with <50% respiratory  variability, suggesting right atrial pressure of 15 mmHg.   8. Cannot exclude a small PFO.   Comparison(s): Changes from prior study are noted. LV chamber now severely  dilated (7 cm).    10/21/21: TTE  1. Left ventricular ejection fraction, by estimation, is 25 to 30%. The  left ventricle has severely decreased function. The left ventricle  demonstrates global hypokinesis. The left ventricular internal cavity size  was moderately dilated. Left  ventricular diastolic parameters are consistent with Grade I diastolic  dysfunction (impaired relaxation).   2. Right ventricular systolic function is normal. The right ventricular  size is normal. There is normal pulmonary artery systolic pressure. The  estimated right ventricular systolic pressure is 21.3 mmHg.   3. The mitral valve is normal in structure. Mild mitral valve  regurgitation. No evidence of mitral stenosis.   4. The aortic valve is tricuspid. Aortic valve regurgitation is not  visualized. No aortic stenosis is present.   5. The inferior vena cava is normal in size with greater than  50%  respiratory variability, suggesting right atrial pressure of 3 mmHg.    07/08/21; LHC 1. Normal filling pressures and normal cardiac output.  2. No significant CAD.  Nonischemic cardiomyopathy.   Patient Profile     HOSTEEN KIENAST is a 61 y.o. male with a hx of  hx of VF arrest (2015), CAD (PCI in 2005, unknown details), ICM,. Chronic CHF (systolic), ESRF on HD , OSA w/CPAP, HTN, HLD, recurrent angio edema who was admitted for another VT storm.    Assessment & Plan  1.  VT storm / refractory VT Mor VT this weekend Shocked again yesterday Re-intubated and sedated Amio gtt at 30 Failed quinidine Off procainamide without an oral option to move to Has angioedema with lidocaine so unable to use, would not try mexiletine    2. Chronic systolic CHF AHF team is on board Coronary angiography in 07/2021 showed no significant CAD, and also did not appear to show prior "stent" which had been reported.  R/LHC 08/25/2022 with Moderate non obstructive CAD and elevated filling pressures as above.  PYP scan negative  Had been planned for PET as outpatient for possible Sarcoid.  ECG with IVCD 150 msec, thought not to be good candidate for CRT upgrade (not true LBBB) Back on pressors overnight > levo is off, still on vasopressin Off BB  Started on steroids if perhaps/possible sarcoid playing a role  3. ESRD Nephrology following.  T/TH/sat Off CRRT   4. Acute hypoxemic respiratory failure Re-intubated, sedated Appreciate HF and CCM care.   5. AFib/flutter  Over the weekend, back in [place] is 3 Started on heparin today   For now, would like to keep him intubated/sedated D/w EP partners to develop management plan VT ablation will a high risk procedure, will look at his VTs further, and discuss plans, timing, here or another facility   For questions or updates, please contact [place] Please consult [email or web address] for contact info under  Cardiology/STEMI.  Signed, Baldwin [place], PA-C  08/31/2022, 8:36 AM

## 2022-08-31 NOTE — Progress Notes (Addendum)
Patient ID: Victor Thompson, male   DOB: [birth year], 61 y.o.   MRN: [id number]     Advanced Heart Failure Rounding Note  PCP-Cardiologist: Loralie Champagne, MD   Subjective:    Admitted with VT storm.   9/24 multiple repeated shocks for VT monomorphic, reintubated, rebolused with amio, quinidine now on hold, started on procainamide.   9/25 Given concern for possible cardiac sarcoid, started empirically on prednisone. IV procainamide discontinued by EP   Remains intubated. Awake on vent and following commands.   Stable overnight, no further VT. On amio gtt at 30/hr + VP 0.04. MAPs 80s   K 5.3 Mg 2.3   CVP 17   Discussed EKGs w/ EP. Appears to have had runs of Afib/AFL this admit. Currently in NSR on tele. HR 70s.    RHC/LHC (9/20):  Coronary Findings  Diagnostic Dominance: Right Left Anterior Descending  Ost LAD to Prox LAD lesion is 40% stenosed.    First Diagonal Branch  1st Diag lesion is 60% stenosed.    Left Circumflex  Mid Cx lesion is 30% stenosed.    Right Coronary Artery  Prox RCA lesion is 30% stenosed.    Intervention   No interventions have been documented.   Right Heart  Right Heart Pressures RHC Procedural Findings: Hemodynamics (mmHg) RA mean 14 RV 52/15 PA 53/27, mean 37 PCWP mean 26 LV 97/29 AO 103/64  Oxygen saturations: PA 76% AO 100%  Cardiac Output (Fick) 8.98  Cardiac Index (Fick) 3.96 PVR 2.8 WU    Objective:   Weight Range: 96.3 kg Body mass index is 33.25 kg/m.   Vital Signs:   Temp:  [97.1 F (36.2 C)-98.4 F (36.9 C)] 97.1 F (36.2 C) (09/25 1200) Pulse Rate:  [54-68] 63 (09/26 0600) Resp:  [0-22] 18 (09/26 0600) BP: (114-122)/(65-70) 122/70 (09/25 1605) SpO2:  [95 %-100 %] 97 % (09/26 0600) Arterial Line BP: (111-153)/(64-81) 153/81 (09/26 0600) FiO2 (%):  [50 %] 50 % (09/26 0344) Weight:  [96.3 kg] 96.3 kg (09/26 0500) Last BM Date :  (pt does not know)  Weight change: Filed Weights   08/28/22 1751 08/29/22  0500 08/31/22 0500  Weight: 97.7 kg 95.2 kg 96.3 kg    Intake/Output:   Intake/Output Summary (Last 24 hours) at 08/31/2022 0738 Last data filed at 08/30/2022 1800 Gross per 24 hour  Intake 964.39 ml  Output --  Net 964.39 ml      Physical Exam   CVP 17  General:  intubated. Awake on vent and following commands   HEENT: + ETT normal Neck: supple. Thick neck, JVD not well visualized Carotids 2+ bilat; no bruits. No lymphadenopathy or thyromegaly appreciated. Cor: PMI nondisplaced. Regular rate & rhythm. No rubs, gallops or murmurs. Lungs: intubated and clear Abdomen: soft, nontender, nondistended. No hepatosplenomegaly. No bruits or masses. Good bowel sounds. Extremities: no cyanosis, clubbing, rash, trace b/l LE edema Neuro: intubated, awake on vent but following commands   Telemetry   NSR 70s, personally reviewed  (personally reviewed)   EKG    N/A  Labs    CBC Recent Labs    08/30/22 0301 08/31/22 0450  WBC 7.1 11.7*  NEUTROABS 6.6 10.4*  HGB 9.8* 9.9*  HCT 28.8* 29.2*  MCV 96.3 97.7  PLT 197 786   Basic Metabolic Panel Recent Labs    08/30/22 0301 08/30/22 1610 08/31/22 0450  NA 133*  --  130*  K 5.1  --  5.3*  CL 94*  --  93*  CO2 24  --  22  GLUCOSE 147*  --  138*  BUN 42*  --  75*  CREATININE 8.31*  --  10.52*  CALCIUM 8.8*  --  8.5*  MG 2.2 2.2 2.3  PHOS 4.3 6.7* 8.0*   Liver Function Tests Recent Labs    08/29/22 2051 08/30/22 0301 08/31/22 0450  AST 18 9*  --   ALT 15 14  --   ALKPHOS 56 54  --   BILITOT 0.4 0.3  --   PROT 6.6 6.3*  --   ALBUMIN 3.1* 2.8* 3.0*   No results for input(s): "LIPASE", "AMYLASE" in the last 72 hours. Cardiac Enzymes No results for input(s): "CKTOTAL", "CKMB", "CKMBINDEX", "TROPONINI" in the last 72 hours.  BNP: BNP (last 3 results) Recent Labs    05/25/22 0705 08/16/22 0627 08/16/22 1257  BNP 1,355.4* QUANTITY NOT SUFFICIENT, UNABLE TO PERFORM TEST 399.2*    ProBNP (last 3 results) No  results for input(s): "PROBNP" in the last 8760 hours.   D-Dimer No results for input(s): "DDIMER" in the last 72 hours. Hemoglobin A1C No results for input(s): "HGBA1C" in the last 72 hours. Fasting Lipid Panel No results for input(s): "CHOL", "HDL", "LDLCALC", "TRIG", "CHOLHDL", "LDLDIRECT" in the last 72 hours. Thyroid Function Tests No results for input(s): "TSH", "T4TOTAL", "T3FREE", "THYROIDAB" in the last 72 hours.  Invalid input(s): "FREET3"   Other results:   Imaging    No results found.   Medications:     Scheduled Medications:  aspirin  81 mg Oral Daily   atorvastatin  80 mg Oral QHS   Chlorhexidine Gluconate Cloth  6 each Topical Q0600   cholecalciferol  5,000 Units Oral Daily   docusate  100 mg Per Tube BID   feeding supplement (PROSource TF20)  60 mL Per Tube BID   fentaNYL (SUBLIMAZE) injection  50 mcg Intravenous Once   heparin  5,000 Units Subcutaneous Q8H   multivitamin  1 tablet Per Tube QHS   mouth rinse  15 mL Mouth Rinse Q2H   pantoprazole  40 mg Per Tube Daily   polyethylene glycol  17 g Per Tube Daily   predniSONE  40 mg Per Tube Q breakfast   sodium chloride flush  10-40 mL Intracatheter Q12H    Infusions:  sodium chloride Stopped (08/26/22 1922)   amiodarone 30.006 mg/hr (08/31/22 0100)   anticoagulant sodium citrate     feeding supplement (VITAL 1.5 CAL) 40 mL/hr at 08/31/22 0500   fentaNYL infusion INTRAVENOUS 200 mcg/hr (08/31/22 7510)   midazolam 15 mg/hr (08/31/22 0138)   vasopressin 0.04 Units/min (08/31/22 0308)    PRN Medications: acetaminophen, albuterol, alteplase, anticoagulant sodium citrate, fentaNYL, heparin, LORazepam, midazolam, mouth rinse, mouth rinse, sodium chloride flush    Patient Profile   Mr Fessel is 61 year old with a history of VT, ESRD, CAD, ischemic CMP, and ESRD.  He had PCI in 2585, uncertain what vessel was involved. Echo 2/22: EF 25-30% with moderate LV dilation and normal RV.  He had VT arrest in  2015, has [employer] subcutaneous ICD.  [name] in 2/22.  LHC/RHC was done in 8/22, showing no significant CAD and stable hemodynamics.   Admitted with refractory VT complicated by shock.    Assessment/Plan   1. VT storm: Has has a Animator ICD.  Refractory monomorphic VT. Multiple shocks.  Later alternating NSR with a slow wide complex AIVR-like rhythm. Recurrent monomorphic VT 9/24 w/ repeated shocks>>re-intubated, rebolused with  amio, quinidine discontinued, started on procainamide.  Given concern for possible cardiac sarcoid, started empirically on prednisone, currently on 40 mg daily. Cath this admit with nonobstructive CAD. Echo EF 20-25%, GIIIDD (restrictive), RV normal.  - No further VT overnight. Now off procainamide per EP. Remains on amio gtt at 30/hr - EP considering high risk VT ablation later this week  - No further beta blocker with hypotension.  - Long term very concerning, not candidate for LVAD with ESRD.   - Continue empiric prednisone 40 mg daily  - Would like eventual cardiac PET for sarcoidosis but can't do at this center.  - Would get CT chest when more stable to look for evidence of pulmonary sarcoidosis.  2. Acute on chronic systolic CHF with cardiogenic shock: In setting of incessant VT.  H/o NICM, echo this admission with EF 20-25%, normal RV function, mild-moderate MR, IVC dilated.  At last admission, unable to complete cardiac MRI due to artifact from [facility] ICD.  PYP scan was negative.  RHC this admit showed volume overload>>CRRT for volume removal, currently off. Scheduled to get iHD today but c/w pressor support, currently on VP 0.04. CVP 17  - Nephrology following. Will likely need restart of CRRT today   - Wean vasopressin as able today.    3. CAD: ?PCI in 2005, unsure what vessel.  Coronary angiography in 8/22 showed no significant CAD and also does not appear to show prior stent so h/o CAD is questionable. LHC this admission with nonobstructive CAD.  -  Continue ASA 81 and statin.  4. ESRD: Since 2/22. TTS HD.  - Currently off CRRT. Suspect will need to restart today given volume status and continued pressor requirements.  - nephrology following  5. Acute hypoxemic respiratory failure: In setting of VT storm.  - Wean sedation, hopefully extubate once volume improved w/ dialysis  6. Hyperkalemia: K 5.3  - lokelma/ HD per nephrology  7. PAF/AFL - noted on EKGs this admit, duration unknown. Currently NSR on tele  - continue amio per above - ? Anticoagulation   Nelida Gores  08/31/2022 7:38 AM  Patient seen with PA, agree with the above note.   Procainamide stopped due to inability to get po med and failure on quinidine.  He remains on amiodarone gtt and empiric prednisone.    He remains intubated.  CVP 14 on my read this morning.   General: NAD Neck: JVP 12-14 cm, no thyromegaly or thyroid nodule.  Lungs: Clear to auscultation bilaterally with normal respiratory effort. CV: Nondisplaced PMI.  Heart regular S1/S2, no S3/S4, no murmur.  No peripheral edema.   Abdomen: Soft, nontender, no hepatosplenomegaly, no distention.  Skin: Intact without lesions or rashes.  Neurologic: Awake on vent.  Extremities: No clubbing or cyanosis.  HEENT: Normal.   Volume overload, would like him as optimized as possible in terms of volume for possible VT ablation on Thursday.   - CVVH vs HD for fluid removal today, renal to see.   Hemodynamically stable with SBP 140s.  Think we can wean off vasopressin.   Paroxysmal atrial fibrillation, will start heparin gtt.   Currently on amiodarone gtt and off procainamide.  Failed quinidine and unable to get procainamide as outpatient.  Remains on empiric prednisone for now as unable to rule out cardiac sarcoidosis.  - Will need sarcoid workup eventually with PET ideally (MRI not interpretable).  Will send ACE level, CT chest when extubated.  - Continue amiodarone gtt.  - Discussed with  EP,  considering VT ablation attempt on Thursday.   CRITICAL CARE Performed by: Loralie Champagne  Total critical care time: 40 minutes  Critical care time was exclusive of separately billable procedures and treating other patients.  Critical care was necessary to treat or prevent imminent or life-threatening deterioration.  Critical care was time spent personally by me on the following activities: development of treatment plan with patient and/or surrogate as well as nursing, discussions with consultants, evaluation of patient's response to treatment, examination of patient, obtaining history from patient or surrogate, ordering and performing treatments and interventions, ordering and review of laboratory studies, ordering and review of radiographic studies, pulse oximetry and re-evaluation of patient's condition.  Loralie Champagne 08/31/2022. 8:37 AM

## 2022-08-31 NOTE — Progress Notes (Signed)
During initiation of CVVHDF this RN was unable to aspirate the dwelling heparin from the HD return line. Pharmacy was notified and line was flushed successfully. Patient on CRRT. No complications noted.

## 2022-08-31 NOTE — Progress Notes (Signed)
ANTICOAGULATION CONSULT NOTE - Initial Consult  Pharmacy Consult for heparin  Indication: atrial fibrillation  Allergies  Allergen Reactions   Ace Inhibitors Swelling    Swelling of the tongue   Influenza Vaccines Hives and Swelling    SWELLING REACTION UNSPECIFIED    Ketorolac Swelling    SWELLING REACTION UNSPECIFIED    Lidocaine Swelling    TONGUE SWELLS   Lisinopril Swelling    TONGUE SWELLING Pt reported problem with a BP med which sounded like  lisinopril  But as of 09/19/06,pt had tolerated altace without problem   Penicillins Swelling    TONGUE SWELLS Has patient had a PCN reaction causing immediate rash, facial/tongue/throat swelling, SOB or lightheadedness with hypotension: Yes Has patient had a PCN reaction causing severe rash involving mucus membranes or skin necrosis: No Has patient had a PCN reaction that required hospitalization No Has patient had a PCN reaction occurring within the last 10 years: Yes If all of the above answers are "NO", then may proceed with Cephalosporin use.     Patient Measurements: Height: '5\' 7"'$  (170.2 cm) Weight: 96.3 kg (212 lb 4.9 oz) IBW/kg (Calculated) : 66.1   Vital Signs: Temp: 98.1 F (36.7 C) (09/26 0800) Temp Source: Axillary (09/26 0800) Pulse Rate: 63 (09/26 0600)  Labs: Recent Labs    08/29/22 0637 08/29/22 2051 08/29/22 2235 08/30/22 0301 08/31/22 0450  HGB 10.9*  --  9.5* 9.8* 9.9*  HCT 32.6*  --  28.0* 28.8* 29.2*  PLT 213  --   --  197 180  CREATININE 6.40* 7.76*  --  8.31* 10.52*    Estimated Creatinine Clearance: 8.5 mL/min (A) (by C-G formula based on SCr of 10.52 mg/dL (H)).   Medical History: Past Medical History:  Diagnosis Date   Acute on chronic systolic CHF (congestive heart failure) ([place]) 06/14/2020   Acute respiratory failure ([place]) 05/28/2022   AICD (automatic cardioverter/defibrillator) present    2015   Allergy    Asthma    no meds   Cardiac arrest [facility]) 2015   Chest pain  05/25/2022   CHF (congestive heart failure) (H[facility])    Coronary artery disease    ESRD on hemodialysis (HCC)    ckd -stage 5   Hyperlipidemia    Hypertension    Myocardial infarction ([place])    Obesity    Pneumonia    Shortness of breath dyspnea    Sleep apnea    USES CPAP   Wears glasses       Assessment: 60yom admitted with VT storm now on amiodarone drip.  Has had runs of atrial fibrillation  - will start anticoagulation for stroke prophylaxis  Will convert to oral after procedures complete H/h stable 9/29 pltc stable 180s   Goal of Therapy:  Heparin level 0.3-0.5 units/ml Monitor platelets by anticoagulation protocol: Yes   Plan:  Stop sq heparin No heparin bolus Heparin drip 1000 uts/hr Heparin level 6hr after initiation Daily heparin level and CBC  Monitor s/s bleeding    Bonnita Nasuti Pharm.D. CPP, BCPS Clinical Pharmacist [phone] 08/31/2022 8:44 AM

## 2022-08-31 NOTE — Progress Notes (Signed)
NAME:  GREGORIO WORLEY, MRN:  [id number], DOB:  10/12/1961, LOS: 7 ADMISSION DATE:  08/24/2022, CONSULTATION DATE:  08/31/22 REFERRING MD:  Aundra Dubin - HF, CHIEF COMPLAINT:  VT storm   History of Present Illness:  61 year old male with PMH Systolic HF (followed by Dr. Aundra Dubin, EF 25-30), H/O VT arrest, s/p ICD, on amiodarone. Presented from dialysis 9/19 with palpitations and feeling faint. In ED, recurrent episodes of VT with repeated [place] IVD discharges. Denied chest pain or dyspnea. Refractory despite shock x 3, (along with internal shocks), amiodarone, lopressor, and bolus of procainamide, (allergy to lidocaine with angioedema) Ultimately requiring intubation and paralytics.   Started on quinidine 9/20. Continued improvement. Extubated 9/22. Reintubated 9/24 in the setting of need for increased sedation and recurrent VT (monomorphic).   Pertinent Medical History:   Past Medical History:  Diagnosis Date   Acute on chronic systolic CHF (congestive heart failure) ([place]) 06/14/2020   Acute respiratory failure (HCC) 05/28/2022   AICD (automatic cardioverter/defibrillator) present    2015   Allergy    Asthma    no meds   Cardiac arrest [facility]) 2015   Chest pain 05/25/2022   CHF (congestive heart failure) (HCC)    Coronary artery disease    ESRD on hemodialysis (HCC)    ckd -stage 5   Hyperlipidemia    Hypertension    Myocardial infarction ([place])    Obesity    Pneumonia    Shortness of breath dyspnea    Sleep apnea    USES CPAP   Wears glasses     Significant Hospital Events: Including procedures, antibiotic start and stop dates in addition to other pertinent events   9/19 - Intubated, central line placed 9/20 - R/LHC performed, CVC placed into left internal jugular vein for CRRT 9/21 - No further VT  9/22 - Extubated, off pressors, off sedation, right radial arterial line removed, CRRT dc'd 9/24 - Reintubated in the setting of need for increased sedation and recurrent VT (monomorphic).  Rebolus with amiodarone. Procainamide started. IO placed. CVC placed later in the evening. Aline replaced. Brady/hypotensive overnight, amio decreased. NE/Vaso resumed. 9/25 - Remains on amio/procainamide. Brady in 56s. Off NE, on vaso 0.04. CVP 8-10. Keeping sedated per EP. 9/26 - HR 60s, remains off of NE, on vaso 0.04. CVP 14-15. Goal RASS -2 to -3 per EP. Versed changed to Propofol. Plan for CRRT vs. HD.  Interim History / Subjective:  No significant events overnight HR 60s today, BP improved Remains off of NE, on vaso 0.04 CVP increased ~14 Sedation adjusted; Versed discontinued and Prop started Goal RASS -2 CRRT vs. HD, defer to Nephro Last VT 9/24 1800  Objective:  Blood pressure 122/70, pulse 63, temperature (!) 97.1 F (36.2 C), resp. rate 18, height '5\' 7"'$  (1.702 m), weight 96.3 kg, SpO2 97 %. CVP:  [7 mmHg-29 mmHg] 29 mmHg  Vent Mode: PRVC FiO2 (%):  [50 %] 50 % Set Rate:  [18 bmp] 18 bmp Vt Set:  [520 mL] 520 mL PEEP:  [5 cmH20] 5 cmH20 Plateau Pressure:  [17 cmH20-19 cmH20] 17 cmH20   Intake/Output Summary (Last 24 hours) at 08/31/2022 0754 Last data filed at 08/30/2022 1800 Gross per 24 hour  Intake 964.39 ml  Output --  Net 964.39 ml    Filed Weights   08/28/22 1751 08/29/22 0500 08/31/22 0500  Weight: 97.7 kg 95.2 kg 96.3 kg   Physical Examination: General: Middle-aged man, intubated/sedated, in NAD. HEENT: [place]/AT, anicteric sclera, PERRL,  moist mucous membranes. ETT/OGT in place. Neuro:  Sedated, but able to nod appropriately to questions.  Responds to verbal stimuli. Following commands consistently. Moves all 4 extremities spontaneously. +Cough and +Gag  CV: Sinus brady, regular with rates 60s, no m/g/r. PULM: Breathing even and unlabored on vent (PEEP 5, FiO2 40%). Lung fields CTAB anteriorly. GI: Soft, nontender, mildly distended. Hypoactive bowel sounds. Extremities: No LE edema noted. Skin: Warm/dry, no rashes.  Assessment & Plan:  Recurrent  ventricular storm  Shock in setting of ventricular storm > resolved  S/p procainamide load 9/19. No sign of ischemic cause on R/LHC, likely due to volume overload with PWCP 26. Patient with recurrent VT (monomorphic), shocked by ICD x 5. - Advanced HF, EP teams following - Continue reduced rate amiodarone - Continue procainamide - Replete electrolytes for goal K > 4, Mg > 2 - Monitor I&Os, goal euvolemia  Acute respiratory failure with hypoxia requiring mechanical ventilation Patient remained on 2 L nasal cannula oxygen with frequent ICD shocks, he looked uncomfortable, started desatting. Decision was to proceed with endotracheal intubation and mechanical ventilation. - Reintubated 9/24 for increased need for sedation - Goal RASS -2 to -3 per EP - PAD protocol with Fentanyl, Propofol; Versed discontinued - Continue full vent support (4-8cc/kg IBW) - Wean FiO2 for O2 sat > 90% - Daily WUA/SBT when clinically appropriate to lighten sedation - VAP bundle - Pulmonary hygiene  Chronic systolic CHF Echo 1/47/0929 with EF 20-25%, global hypokinesis, Grade III diastolic dysfunction. - AHF team following, appreciate management.  ESRD on HD - Nephrology following - CRRT vs. iHD today, 9/26; favor CRRT in the setting of frequent VT/cardiac irritability with volume shifts - Trend BMP - Monitor I&Os - Replete lytes as indicated above  Morbid obesity - Nutrition/RD following  Best Practice (right click and "Reselect all SmartList Selections" daily)   Diet/type: NPO DVT prophylaxis: systemic heparin GI prophylaxis: PPI Lines: Central line and Dialysis Catheter Foley:  N/A Code Status:  full code Last date of multidisciplinary goals of care discussion [Per primary team]  Critical care time:   The patient is critically ill with multiple organ system failure and requires high complexity decision making for assessment and support, frequent evaluation and titration of therapies, advanced  monitoring, review of radiographic studies and interpretation of complex data.   Critical Care Time devoted to patient care services, exclusive of separately billable procedures, described in this note is 34 minutes.  Lestine Mount, PA-C [facility] 08/31/22 7:54 AM  Please see [email or web address] for pager details.  From 7A-7P if no response, please call [phone] After hours, please call ELink [phone]

## 2022-08-31 NOTE — Progress Notes (Signed)
OT Cancellation Note  Patient Details Name: Victor Thompson MRN: [id number] DOB: 12-20-1960   Cancelled Treatment:    Reason Eval/Treat Not Completed: Patient not medically ready (sedated/intubated. Discussed with nsg. Will check back later tin the week to assess for medical readiness.)  [facility] 08/31/2022, 12:07 PM Maurie Boettcher, OT/L   Acute OT Clinical Specialist Acute Rehabilitation Services Pager [phone] Office [phone]

## 2022-08-31 NOTE — Progress Notes (Signed)
ANTICOAGULATION CONSULT NOTE - Initial Consult  Pharmacy Consult for heparin  Indication: atrial fibrillation  Allergies  Allergen Reactions   Ace Inhibitors Swelling    Swelling of the tongue   Influenza Vaccines Hives and Swelling    SWELLING REACTION UNSPECIFIED    Ketorolac Swelling    SWELLING REACTION UNSPECIFIED    Lidocaine Swelling    TONGUE SWELLS   Lisinopril Swelling    TONGUE SWELLING Pt reported problem with a BP med which sounded like  lisinopril  But as of 09/19/06,pt had tolerated altace without problem   Penicillins Swelling    TONGUE SWELLS Has patient had a PCN reaction causing immediate rash, facial/tongue/throat swelling, SOB or lightheadedness with hypotension: Yes Has patient had a PCN reaction causing severe rash involving mucus membranes or skin necrosis: No Has patient had a PCN reaction that required hospitalization No Has patient had a PCN reaction occurring within the last 10 years: Yes If all of the above answers are "NO", then may proceed with Cephalosporin use.     Patient Measurements: Height: '5\' 7"'$  (170.2 cm) Weight: 96.3 kg (212 lb 4.9 oz) IBW/kg (Calculated) : 66.1   Vital Signs: Temp: 97.6 F (36.4 C) (09/26 1119) Temp Source: Axillary (09/26 0800) BP: 114/63 (09/26 1548) Pulse Rate: 66 (09/26 0840)  Labs: Recent Labs    08/29/22 3354 08/29/22 2051 08/29/22 2235 08/30/22 0301 08/31/22 0450 08/31/22 1508 08/31/22 1509  HGB 10.9*  --  9.5* 9.8* 9.9*  --   --   HCT 32.6*  --  28.0* 28.8* 29.2*  --   --   PLT 213  --   --  197 180  --   --   HEPARINUNFRC  --   --   --   --   --  0.40  --   CREATININE 6.40*   < >  --  8.31* 10.52*  --  9.25*   < > = values in this interval not displayed.     Estimated Creatinine Clearance: 9.6 mL/min (A) (by C-G formula based on SCr of 9.25 mg/dL (H)).   Medical History: Past Medical History:  Diagnosis Date   Acute on chronic systolic CHF (congestive heart failure) ([place]) 06/14/2020    Acute respiratory failure ([place]) 05/28/2022   AICD (automatic cardioverter/defibrillator) present    2015   Allergy    Asthma    no meds   Cardiac arrest [facility]) 2015   Chest pain 05/25/2022   CHF (congestive heart failure) (HCC)    Coronary artery disease    ESRD on hemodialysis (HCC)    ckd -stage 5   Hyperlipidemia    Hypertension    Myocardial infarction ([place])    Obesity    Pneumonia    Shortness of breath dyspnea    Sleep apnea    USES CPAP   Wears glasses       Assessment: 60yom admitted with VT storm now on amiodarone drip.  Has had runs of atrial fibrillation  - will start anticoagulation for stroke prophylaxis  Will convert to oral after procedures complete H/h stable 9/29 pltc stable 180s   9/26 PM HL 0.40 No issues with the infusion No signs of bleeding  Goal of Therapy:  Heparin level 0.3-0.5 units/ml Monitor platelets by anticoagulation protocol: Yes   Plan:  Continue heparin drip at 1000 units/hr Confirmatory Heparin level 8hr Daily heparin level and CBC  Monitor s/s bleeding    Victor Thompson BS, PharmD, BCPS Clinical Pharmacist 08/31/2022 4:05  PM  Contact: [phone] after 3 PM  "Be curious, not judgmental..." -Jamal Maes

## 2022-08-31 NOTE — Progress Notes (Signed)
[facility] Progress Note   Assessment/ Plan:    [facility]  4h 69mn  450/ 1.5  116.4kg  AVF  15g  Hep 3000 - hectorol 7 ug iv tiw - no esa     Assessment/ Plan: VT storm / VF - pt rx'd w/ IV amio, po quinidine, sedation / intubation, paralytics and procainamide. Had more VT/ shocks/ amio.  On IV amio, procainamide stopped Shock/ hypotension - back on pressors, going to try to wean vaso today per cardiology ESRD - on HD TTS. SP CRRT 9/20- 9/22. SP regular HD Saturday. Were going to attempt IHD today- however we need to optimize volume for possible VT ablation.  Concerned he won't be able to tolerate a high enough UF goal to get euvolemic without more arrhythmias so will resume CRRT HFrEF / EF 20-25%/ sp ICD - not LVAD candidate due to esrd Volume - as above Anemia esrd - Hb 9.9, follow and give ESA if needed MBD ckd - CCa in range, phos a bit high.  Will resume auryxia 1 ac tid for now (usual dose is 2 ac). Phos is 4-5 range.   Subjective:    NO more AICD shocks overnight.  For possible VT ablation Thursday.  CVP up to 14.     Objective:   BP 138/70   Pulse 66   Temp 98.1 F (36.7 C) (Axillary)   Resp 18   Ht '5\' 7"'$  (1.702 m)   Wt 96.3 kg   SpO2 97%   BMI 33.25 kg/m   Physical Exam: [id number] sedated CVS: borderline brady Resp: clear Abd: soft Ext: trace LE edema ACCESS: L AVF + T/B  Labs: BMET Recent Labs  Lab 08/26/22 1557 08/27/22 0430 08/28/22 0918 08/29/22 0637 08/29/22 2051 08/29/22 2235 08/30/22 0301 08/30/22 1610 08/31/22 0450  NA 134* 136 134* 136 134* 131* 133*  --  130*  K 5.0 4.6 4.6 4.4 5.3* 4.0 5.1  --  5.3*  CL 102 101 97* 95* 94*  --  94*  --  93*  CO2 '27 26 25 27 23  '$ --  24  --  22  GLUCOSE 104* 97 102* 99 107*  --  147*  --  138*  BUN 24* 20 33* 27* 38*  --  42*  --  75*  CREATININE 6.08* 4.65* 6.94* 6.40* 7.76*  --  8.31*  --  10.52*  CALCIUM 7.9* 8.2* 8.5* 8.9 8.9  --  8.8*  --  8.5*  PHOS 3.9 3.3 4.8* 4.6   --   --  4.3 6.7* 8.0*   CBC Recent Labs  Lab 08/28/22 0702 08/29/22 0637 08/29/22 2235 08/30/22 0301 08/31/22 0450  WBC 8.2 7.1  --  7.1 11.7*  NEUTROABS 6.0 5.3  --  6.6 10.4*  HGB 12.0* 10.9* 9.5* 9.8* 9.9*  HCT 35.6* 32.6* 28.0* 28.8* 29.2*  MCV 99.4 97.0  --  96.3 97.7  PLT 168 213  --  197 180      Medications:     [START ON 09/01/2022] aspirin  81 mg Per Tube Daily   atorvastatin  80 mg Oral QHS   Chlorhexidine Gluconate Cloth  6 each Topical Q0600   cholecalciferol  5,000 Units Oral Daily   docusate  100 mg Per Tube BID   feeding supplement (PROSource TF20)  60 mL Per Tube BID   fentaNYL (SUBLIMAZE) injection  50 mcg Intravenous Once   multivitamin  1 tablet Per Tube QHS  mouth rinse  15 mL Mouth Rinse Q2H   pantoprazole  40 mg Per Tube Daily   polyethylene glycol  17 g Per Tube Daily   predniSONE  40 mg Per Tube Q breakfast   sodium chloride flush  10-40 mL Intracatheter Q12H     Madelon Lips MD 08/31/2022, 10:57 AM

## 2022-09-01 ENCOUNTER — Other Ambulatory Visit (HOSPITAL_COMMUNITY): Payer: Self-pay

## 2022-09-01 ENCOUNTER — Encounter: Payer: Self-pay | Admitting: Internal Medicine

## 2022-09-01 ENCOUNTER — Ambulatory Visit: Payer: Self-pay

## 2022-09-01 ENCOUNTER — Inpatient Hospital Stay (HOSPITAL_COMMUNITY): Payer: Medicare Other

## 2022-09-01 ENCOUNTER — Telehealth (HOSPITAL_COMMUNITY): Payer: Self-pay | Admitting: Pharmacy Technician

## 2022-09-01 LAB — RENAL FUNCTION PANEL
Albumin: 3.1 g/dL — ABNORMAL LOW (ref 3.5–5.0)
Albumin: 3.3 g/dL — ABNORMAL LOW (ref 3.5–5.0)
Anion gap: 13 (ref 5–15)
Anion gap: 9 (ref 5–15)
BUN: 60 mg/dL — ABNORMAL HIGH (ref 6–20)
BUN: 49 mg/dL — ABNORMAL HIGH (ref 6–20)
CO2: 24 mmol/L (ref 22–32)
CO2: 26 mmol/L (ref 22–32)
Calcium: 8.3 mg/dL — ABNORMAL LOW (ref 8.9–10.3)
Calcium: 8 mg/dL — ABNORMAL LOW (ref 8.9–10.3)
Chloride: 97 mmol/L — ABNORMAL LOW (ref 98–111)
Chloride: 101 mmol/L (ref 98–111)
Creatinine, Ser: 6.22 mg/dL — ABNORMAL HIGH (ref 0.61–1.24)
Creatinine, Ser: 4.56 mg/dL — ABNORMAL HIGH (ref 0.61–1.24)
GFR, Estimated: 10 mL/min — ABNORMAL LOW (ref >60)
GFR, Estimated: 14 mL/min — ABNORMAL LOW (ref >60)
Glucose, Bld: 117 mg/dL — ABNORMAL HIGH (ref 70–99)
Glucose, Bld: 133 mg/dL — ABNORMAL HIGH (ref 70–99)
Phosphorus: 4.8 mg/dL — ABNORMAL HIGH (ref 2.5–4.6)
Phosphorus: 3.4 mg/dL (ref 2.5–4.6)
Potassium: 5.6 mmol/L — ABNORMAL HIGH (ref 3.5–5.1)
Potassium: 5.1 mmol/L (ref 3.5–5.1)
Sodium: 134 mmol/L — ABNORMAL LOW (ref 135–145)
Sodium: 136 mmol/L (ref 135–145)

## 2022-09-01 LAB — CBC WITH DIFFERENTIAL/PLATELET
Abs Immature Granulocytes: 0.14 10*3/uL — ABNORMAL HIGH (ref 0.00–0.07)
Basophils Absolute: 0 10*3/uL (ref 0.0–0.1)
Basophils Relative: 0 %
Eosinophils Absolute: 0 10*3/uL (ref 0.0–0.5)
Eosinophils Relative: 0 %
HCT: 30.9 % — ABNORMAL LOW (ref 39.0–52.0)
Hemoglobin: 10.4 g/dL — ABNORMAL LOW (ref 13.0–17.0)
Immature Granulocytes: 1 %
Lymphocytes Relative: 2 %
Lymphs Abs: 0.3 10*3/uL — ABNORMAL LOW (ref 0.7–4.0)
MCH: 32.9 pg (ref 26.0–34.0)
MCHC: 33.7 g/dL (ref 30.0–36.0)
MCV: 97.8 fL (ref 80.0–100.0)
Monocytes Absolute: 1.6 10*3/uL — ABNORMAL HIGH (ref 0.1–1.0)
Monocytes Relative: 13 %
Neutro Abs: 10.6 10*3/uL — ABNORMAL HIGH (ref 1.7–7.7)
Neutrophils Relative %: 84 %
Platelets: 213 10*3/uL (ref 150–400)
RBC: 3.16 MIL/uL — ABNORMAL LOW (ref 4.22–5.81)
RDW: 16 % — ABNORMAL HIGH (ref 11.5–15.5)
WBC: 12.7 10*3/uL — ABNORMAL HIGH (ref 4.0–10.5)
nRBC: 0 % (ref 0.0–0.2)

## 2022-09-01 LAB — ANGIOTENSIN CONVERTING ENZYME: Angiotensin-Converting Enzyme: 33 U/L (ref 14–82)

## 2022-09-01 LAB — GLUCOSE, CAPILLARY
Glucose-Capillary: 102 mg/dL — ABNORMAL HIGH (ref 70–99)
Glucose-Capillary: 110 mg/dL — ABNORMAL HIGH (ref 70–99)
Glucose-Capillary: 116 mg/dL — ABNORMAL HIGH (ref 70–99)
Glucose-Capillary: 132 mg/dL — ABNORMAL HIGH (ref 70–99)
Glucose-Capillary: 150 mg/dL — ABNORMAL HIGH (ref 70–99)
Glucose-Capillary: 180 mg/dL — ABNORMAL HIGH (ref 70–99)

## 2022-09-01 LAB — HEPARIN LEVEL (UNFRACTIONATED)
Heparin Unfractionated: 0.63 IU/mL (ref 0.30–0.70)
Heparin Unfractionated: 0.6 IU/mL (ref 0.30–0.70)

## 2022-09-01 LAB — TRIGLYCERIDES: Triglycerides: 47 mg/dL (ref <150)

## 2022-09-01 MED ORDER — ALBUMIN HUMAN 25 % IV SOLN
25.0000 g | Freq: Four times a day (QID) | INTRAVENOUS | Status: AC
  Administered 2022-09-01: 12.5 g via INTRAVENOUS
  Administered 2022-09-01 (×2): 25 g via INTRAVENOUS
  Filled 2022-09-01 (×3): qty 100

## 2022-09-01 MED ORDER — ALBUMIN HUMAN 25 % IV SOLN
INTRAVENOUS | Status: AC
  Filled 2022-09-01: qty 50

## 2022-09-01 MED ORDER — ALBUMIN HUMAN 25 % IV SOLN
INTRAVENOUS | Status: AC
  Administered 2022-09-01: 12.5 g via INTRAVENOUS
  Filled 2022-09-01: qty 50

## 2022-09-01 MED ORDER — SODIUM CHLORIDE 0.9 % IV SOLN
2.0000 g | INTRAVENOUS | Status: DC
  Administered 2022-09-01 – 2022-09-02 (×2): 2 g via INTRAVENOUS
  Filled 2022-09-01 (×2): qty 20

## 2022-09-01 MED ORDER — PRISMASOL BGK 0/2.5 32-2.5 MEQ/L EC SOLN
Status: DC
  Filled 2022-09-01 (×5): qty 5000

## 2022-09-01 MED ORDER — PRISMASOL BGK 0/2.5 32-2.5 MEQ/L EC SOLN
Status: DC
  Filled 2022-09-01 (×21): qty 5000

## 2022-09-01 MED ORDER — APIXABAN 5 MG PO TABS
5.0000 mg | ORAL_TABLET | Freq: Two times a day (BID) | ORAL | Status: DC
  Administered 2022-09-01 – 2022-09-02 (×3): 5 mg
  Filled 2022-09-01 (×3): qty 1

## 2022-09-01 MED ORDER — MEXILETINE HCL 200 MG PO CAPS
200.0000 mg | ORAL_CAPSULE | Freq: Two times a day (BID) | ORAL | Status: DC
  Administered 2022-09-01 – 2022-09-02 (×3): 200 mg
  Filled 2022-09-01 (×4): qty 1

## 2022-09-01 MED ORDER — PRISMASOL BGK 0/2.5 32-2.5 MEQ/L EC SOLN
Status: DC
  Filled 2022-09-01 (×7): qty 5000

## 2022-09-01 MED ORDER — SENNOSIDES 8.8 MG/5ML PO SYRP
10.0000 mL | ORAL_SOLUTION | Freq: Two times a day (BID) | ORAL | Status: DC
  Administered 2022-09-01 – 2022-09-02 (×2): 10 mL
  Filled 2022-09-01 (×2): qty 10

## 2022-09-01 MED ORDER — NOREPINEPHRINE 16 MG/250ML-% IV SOLN
0.0000 ug/min | INTRAVENOUS | Status: DC
  Administered 2022-09-01: 2 ug/min via INTRAVENOUS
  Filled 2022-09-01: qty 250

## 2022-09-01 NOTE — Progress Notes (Signed)
Received and completed form from [employer] for CPAP supplies on this patient.  Form signed 09/01/2022.

## 2022-09-01 NOTE — Patient Outreach (Addendum)
  Care Coordination   Follow Up Visit Note   09/01/2022 Name: Victor Thompson MRN: [id number] DOB: 1961/04/25  Victor Thompson is a 61 y.o. year old male who sees [name], MD for primary care. Chart review in preparation to contact patient for RN CC follow up. The patient remains to be inpatient at [facility].   What matters to the patients health and wellness today?  Not applicable     Goals Addressed             This Visit's Progress    Care Coordination Activities - further follow up needed       Care Coordination Interventions: Reviewed chart in preparation to contact patient, noted patient was admitted to [facility] on 08/24/22 with dx: Ventricular tachyarrhythmia, patient remains to be inpatient, vented in ICU receiving CRRT        SDOH assessments and interventions completed:  No     Care Coordination Interventions Activated:  Yes  Care Coordination Interventions:  Yes, provided   Follow up plan: Follow up call scheduled for 09/15/22 '@11'$ :15 AM     Encounter Outcome:  Pt. Visit Completed

## 2022-09-01 NOTE — Progress Notes (Signed)
[facility] Progress Note   Assessment/ Plan:    [facility]  4h 13mn  450/ 1.5  116.4kg  AVF  15g  Hep 3000 - hectorol 7 ug iv tiw - no esa     Assessment/ Plan: VT storm / VF - pt rx'd w/ IV amio, po quinidine, sedation / intubation, paralytics and procainamide. Had more VT/ shocks/ amio.  On IV amio, procainamide stopped and now mexilitine started today 9/27 Shock/ hypotension - back on vaso ESRD - on HD TTS. SP CRRT 9/20- 9/22. SP regular HD Saturday. Continue CRRT for another 24-48 hrs or so until volume is optimized and then try IHD. He struggles with fluids as an OP and does not adhere to his PO medications.  If he is unable to take his amio and mexilitine, there's a high risk of him not being able to tolerate IHD d/t recurrent VT storm (which I think is exacerbated by volume overload) HFrEF / EF 20-25%/ sp ICD - not LVAD candidate due to esrd Volume - as above Anemia esrd - Hb 9.9, follow and give ESA if needed MBD ckd - CCa in range, phos a bit high.  Will resume auryxia 1 ac tid for now (usual dose is 2 ac). Phos is 4-5 range.   Subjective:    Starting mexilitine today.  On CRRT, tolerating well.  Eyes open today and somewhat responsive   Objective:   BP (!) 118/56   Pulse 60   Temp (!) 97.2 F (36.2 C)   Resp 16   Ht '5\' 7"'$  (1.702 m)   Wt 120 kg Comment: several weight attempts,  bed switched , same weight  SpO2 97%   BMI 41.43 kg/m   Physical Exam: [id number] awake CVS: borderline brady Resp: clear Abd: soft Ext: trace LE edema ACCESS: L AVF + T/B  Labs: BMET Recent Labs  Lab 08/28/22 0918 08/29/22 0637 08/29/22 2051 08/29/22 2235 08/30/22 0301 08/30/22 1610 08/31/22 0450 08/31/22 1509 09/01/22 0354  NA 134* 136 134* 131* 133*  --  130* 133* 134*  K 4.6 4.4 5.3* 4.0 5.1  --  5.3* 5.1 5.6*  CL 97* 95* 94*  --  94*  --  93* 96* 97*  CO2 '25 27 23  '$ --  24  --  22 21* 24  GLUCOSE 102* 99 107*  --  147*  --  138* 145* 117*   BUN 33* 27* 38*  --  42*  --  75* 79* 60*  CREATININE 6.94* 6.40* 7.76*  --  8.31*  --  10.52* 9.25* 6.22*  CALCIUM 8.5* 8.9 8.9  --  8.8*  --  8.5* 8.2* 8.3*  PHOS 4.8* 4.6  --   --  4.3 6.7* 8.0* 7.2* 4.8*   CBC Recent Labs  Lab 08/29/22 0637 08/29/22 2235 08/30/22 0301 08/31/22 0450 09/01/22 0354  WBC 7.1  --  7.1 11.7* 12.7*  NEUTROABS 5.3  --  6.6 10.4* 10.6*  HGB 10.9* 9.5* 9.8* 9.9* 10.4*  HCT 32.6* 28.0* 28.8* 29.2* 30.9*  MCV 97.0  --  96.3 97.7 97.8  PLT 213  --  197 180 213      Medications:     apixaban  5 mg Per Tube BID   atorvastatin  80 mg Per Tube QHS   Chlorhexidine Gluconate Cloth  6 each Topical Q0600   cholecalciferol  5,000 Units Oral Daily   docusate  100 mg Per Tube BID   feeding supplement (  PROSource TF20)  60 mL Per Tube BID   fentaNYL (SUBLIMAZE) injection  50 mcg Intravenous Once   mexiletine  200 mg Per Tube BID   multivitamin  1 tablet Per Tube QHS   mouth rinse  15 mL Mouth Rinse Q2H   pantoprazole  40 mg Per Tube Daily   polyethylene glycol  17 g Per Tube Daily   predniSONE  40 mg Per Tube Q breakfast   sodium chloride flush  10-40 mL Intracatheter Q12H     Madelon Lips MD 09/01/2022, 9:58 AM

## 2022-09-01 NOTE — Progress Notes (Signed)
ANTICOAGULATION CONSULT NOTE  Pharmacy Consult for heparin  Indication: atrial fibrillation  Allergies  Allergen Reactions   Ace Inhibitors Swelling    Swelling of the tongue   Influenza Vaccines Hives and Swelling    SWELLING REACTION UNSPECIFIED    Ketorolac Swelling    SWELLING REACTION UNSPECIFIED    Lidocaine Swelling    TONGUE SWELLS   Lisinopril Swelling    TONGUE SWELLING Pt reported problem with a BP med which sounded like  lisinopril  But as of 09/19/06,pt had tolerated altace without problem   Penicillins Swelling    TONGUE SWELLS Has patient had a PCN reaction causing immediate rash, facial/tongue/throat swelling, SOB or lightheadedness with hypotension: Yes Has patient had a PCN reaction causing severe rash involving mucus membranes or skin necrosis: No Has patient had a PCN reaction that required hospitalization No Has patient had a PCN reaction occurring within the last 10 years: Yes If all of the above answers are "NO", then may proceed with Cephalosporin use.     Patient Measurements: Height: '5\' 7"'$  (170.2 cm) Weight: 96.3 kg (212 lb 4.9 oz) IBW/kg (Calculated) : 66.1  Vital Signs: Temp: 93.1 F (33.9 C) (09/26 1900) Temp Source: Axillary (09/26 1900) BP: 114/63 (09/26 1548) Pulse Rate: 49 (09/26 1700)  Labs: Recent Labs    08/29/22 0102 08/29/22 2051 08/29/22 2235 08/30/22 0301 08/31/22 0450 08/31/22 1508 08/31/22 1509 09/01/22 0020  HGB 10.9*  --  9.5* 9.8* 9.9*  --   --   --   HCT 32.6*  --  28.0* 28.8* 29.2*  --   --   --   PLT 213  --   --  197 180  --   --   --   HEPARINUNFRC  --   --   --   --   --  0.40  --  0.63  CREATININE 6.40*   < >  --  8.31* 10.52*  --  9.25*  --    < > = values in this interval not displayed.     Estimated Creatinine Clearance: 9.6 mL/min (A) (by C-G formula based on SCr of 9.25 mg/dL (H)).   Assessment: Victor Thompson admitted with VT storm and runs of atrial fibrillation. Pharmacy consulted to dose IV  heparin.  Heparin level remains therapeutic and trending up.  No bleeding documented.  Goal of Therapy:  Heparin level 0.3-0.5 units/ml Monitor platelets by anticoagulation protocol: Yes   Plan:  Reduce heparin drip to 950 units/hr Daily heparin level and CBC  Monitor s/sx bleeding  Lien Lyman D. Mina Marble, PharmD, BCPS, [place] 09/01/2022, 1:19 AM

## 2022-09-01 NOTE — TOC Benefit Eligibility Note (Signed)
Patient Teacher, English as a foreign language completed.    The patient is currently admitted and upon discharge could be taking Eliquis 5 mg.  The current 30 day co-pay is $0.00.   The patient is currently admitted and upon discharge could be taking mexiletine 200 mg.  The current 30 day co-pay is $0.00.   The patient is insured through [place], [place] Patient Advocate Specialist [place] Patient Advocate Team Direct Number: [phone]  Fax: [phone]

## 2022-09-01 NOTE — Progress Notes (Signed)
Electrophysiology Rounding Note  [name] Date of Encounter: 09/01/2022  Primary Cardiologist: Loralie Champagne, MD Electrophysiologist: Cristopher Peru, MD   Subjective   NAEON. Remains sedated, intubated, on CRRT.   Inpatient Medications    Scheduled Meds:  apixaban  5 mg Per Tube BID   atorvastatin  80 mg Per Tube QHS   Chlorhexidine Gluconate Cloth  6 each Topical Q0600   cholecalciferol  5,000 Units Oral Daily   docusate  100 mg Per Tube BID   feeding supplement (PROSource TF20)  60 mL Per Tube BID   fentaNYL (SUBLIMAZE) injection  50 mcg Intravenous Once   mexiletine  200 mg Per Tube BID   multivitamin  1 tablet Per Tube QHS   mouth rinse  15 mL Mouth Rinse Q2H   pantoprazole  40 mg Per Tube Daily   polyethylene glycol  17 g Per Tube Daily   predniSONE  40 mg Per Tube Q breakfast   sodium chloride flush  10-40 mL Intracatheter Q12H   Continuous Infusions:   prismasol BGK 4/2.5 400 mL/hr at 09/01/22 0115    prismasol BGK 4/2.5 500 mL/hr at 08/31/22 2227   sodium chloride Stopped (08/26/22 1922)   amiodarone 30 mg/hr (09/01/22 0800)   anticoagulant sodium citrate     feeding supplement (VITAL 1.5 CAL) 50 mL/hr at 09/01/22 0800   fentaNYL infusion INTRAVENOUS 175 mcg/hr (09/01/22 0800)   prismasol BGK 4/2.5 2,000 mL/hr at 09/01/22 0624   propofol (DIPRIVAN) infusion 15 mcg/kg/min (09/01/22 0800)   vasopressin 0.04 Units/min (09/01/22 0800)   PRN Meds: acetaminophen, albuterol, alteplase, anticoagulant sodium citrate, fentaNYL, heparin, LORazepam, mouth rinse, mouth rinse, sodium chloride flush   Vital Signs    Vitals:   09/01/22 0600 09/01/22 0637 09/01/22 0756 09/01/22 0800  BP:   (!) 118/56   Pulse:   61 60  Resp: 18 17 (!) 22 16  Temp: (!) 96.1 F (35.6 C) (!) 96.1 F (35.6 C)  (!) 97 F (36.1 C)  TempSrc:    Esophageal  SpO2:    97%  Weight:      Height:        Intake/Output Summary (Last 24 hours) at 09/01/2022 0834 Last data  filed at 09/01/2022 0800 Gross per 24 hour  Intake 2970.56 ml  Output 3662 ml  Net -691.44 ml    Filed Weights   08/29/22 0500 08/31/22 0500 09/01/22 0500  Weight: 95.2 kg 96.3 kg 120 kg    Physical Exam    Physical exam is largely unchanged from prior GEN- The patient is intubated, sedated.  [place]akes, follows commands, Morbidly obese  HEENT- + ET tube.  Lungs- in vent, clear b/l Heart- RRR, no murmurs, rubs or gallops GI- soft Extremities- no clubbing or cyanosis. No edema Skin- no rash or lesion Neuro- intubated, opens eyes and tracks around room   Labs    CBC Recent Labs    08/31/22 0450 09/01/22 0354  WBC 11.7* 12.7*  NEUTROABS 10.4* 10.6*  HGB 9.9* 10.4*  HCT 29.2* 30.9*  MCV 97.7 97.8  PLT 180 387    Basic Metabolic Panel Recent Labs    08/30/22 1610 08/31/22 0450 08/31/22 1509 09/01/22 0354  NA  --  130* 133* 134*  K  --  5.3* 5.1 5.6*  CL  --  93* 96* 97*  CO2  --  22 21* 24  GLUCOSE  --  138* 145* 117*  BUN  --  75* 79* 60*  CREATININE  --  10.52* 9.25* 6.22*  CALCIUM  --  8.5* 8.2* 8.3*  MG 2.2 2.3  --   --   PHOS 6.7* 8.0* 7.2* 4.8*    Liver Function Tests Recent Labs    08/29/22 2051 08/30/22 0301 08/31/22 0450 08/31/22 1509 09/01/22 0354  AST 18 9*  --   --   --   ALT 15 14  --   --   --   ALKPHOS 56 54  --   --   --   BILITOT 0.4 0.3  --   --   --   PROT 6.6 6.3*  --   --   --   ALBUMIN 3.1* 2.8*   < > 2.9* 3.1*   < > = values in this interval not displayed.    No results for input(s): "LIPASE", "AMYLASE" in the last 72 hours. Cardiac Enzymes No results for input(s): "CKTOTAL", "CKMB", "CKMBINDEX", "TROPONINI" in the last 72 hours.   Telemetry    SB/SR 50's- 70s, no VT since about 1800 on 9/24 (personally reviewed)  Radiology     08/25/22: [facility] Left Anterior Descending  Ost LAD to Prox LAD lesion is 40% stenosed.    First Diagonal Branch  1st Diag lesion is 60% stenosed.    Left Circumflex  Mid Cx lesion is 30%  stenosed.    Right Coronary Artery  Prox RCA lesion is 30% stenosed.     Right Heart Pressures RHC Procedural Findings: Hemodynamics (mmHg) RA mean 14 RV 52/15 PA 53/27, mean 37 PCWP mean 26 LV 97/29 AO 103/64  Oxygen saturations: PA 76% AO 100%  Cardiac Output (Fick) 8.98  Cardiac Index (Fick) 3.96 PVR 2.8 WU    08/25/2022: TTE  1. Left ventricular ejection fraction, by estimation, is 20 to 25%. The  left ventricle has severely decreased function. The left ventricle  demonstrates global hypokinesis. The left ventricular internal cavity size  was severely dilated. Left ventricular  diastolic parameters are consistent with Grade III diastolic dysfunction  (restrictive). Elevated left ventricular end-diastolic pressure.   2. Right ventricular systolic function is normal. The right ventricular  size is mildly enlarged. There is mildly elevated pulmonary artery  systolic pressure.   3. Left atrial size was moderately dilated.   4. Right atrial size was mildly dilated.   5. The mitral valve is normal in structure. Mild to moderate mitral valve  regurgitation. No evidence of mitral stenosis.   6. The aortic valve is grossly normal. Aortic valve regurgitation is not  visualized. No aortic stenosis is present.   7. The inferior vena cava is dilated in size with <50% respiratory  variability, suggesting right atrial pressure of 15 mmHg.   8. Cannot exclude a small PFO.   Comparison(s): Changes from prior study are noted. LV chamber now severely  dilated (7 cm).    10/21/21: TTE  1. Left ventricular ejection fraction, by estimation, is 25 to 30%. The  left ventricle has severely decreased function. The left ventricle  demonstrates global hypokinesis. The left ventricular internal cavity size  was moderately dilated. Left  ventricular diastolic parameters are consistent with Grade I diastolic  dysfunction (impaired relaxation).   2. Right ventricular systolic function is  normal. The right ventricular  size is normal. There is normal pulmonary artery systolic pressure. The  estimated right ventricular systolic pressure is 13.0 mmHg.   3. The mitral valve is normal in structure. Mild mitral valve  regurgitation. No evidence of mitral stenosis.  4. The aortic valve is tricuspid. Aortic valve regurgitation is not  visualized. No aortic stenosis is present.   5. The inferior vena cava is normal in size with greater than 50%  respiratory variability, suggesting right atrial pressure of 3 mmHg.    07/08/21; LHC 1. Normal filling pressures and normal cardiac output.  2. No significant CAD.  Nonischemic cardiomyopathy.   Patient Profile     DALLAN SCHONBERG is a 62 y.o. male with a hx of  hx of VF arrest (2015), CAD (PCI in 2005, unknown details), ICM,. Chronic CHF (systolic), ESRF on HD , OSA w/CPAP, HTN, HLD, recurrent angio edema who was admitted for another VT storm.    Assessment & Plan    1.  VT storm / refractory VT More VT this weekend Shocked again 9/24 by both ICD and external defib Re-intubated and sedated Amio gtt at 30 Failed quinidine Off procainamide without an oral option to move to Has angioedema with lidocaine. Extensive discussion with pharmacy regarding safety of mexiletine use. Received one dose 9/19 w/o s/s of angioedema. - start mexiletine '200mg'$  BID - plan to remain intubated for airway protection while initiating mexiletine Dr. Myles Gip discussed at length with EP partners including Dr. Lovena Le -- too high risk for VT ablation, unlikely to be successful. No plans for ablation at this time   2. Chronic systolic CHF AHF team is on board Coronary angiography in 07/2021 showed no significant CAD, and also did not appear to show prior "stent" which had been reported.  R/LHC 08/25/2022 with Moderate non obstructive CAD and elevated filling pressures as above.  PYP scan negative  Had been planned for PET as outpatient for possible Sarcoid.  ECG  with IVCD 150 msec, thought not to be good candidate for CRT upgrade (not true LBBB) Back on pressors overnight > levo is off, still on vasopressin Off BB Started on steroids if perhaps/possible sarcoid playing a role  3. ESRD Nephrology following.  Re-initiated CRRT   4. Acute hypoxemic respiratory failure Re-intubated, sedated Appreciate HF and CCM care.   5. AFib/flutter  Over the weekend, back in [place] is 3 Started on heparin 9/26 with plans to transition to eliquis   For questions or updates, please contact [place] Please consult [email or web address] for contact info under Cardiology/STEMI.  Signed, Mamie Levers, NP  09/01/2022, 8:34 AM

## 2022-09-01 NOTE — Progress Notes (Signed)
Patient ID: Victor Thompson, male   DOB: September 02, 1961, 61 y.o.   MRN: [id number]     Advanced Heart Failure Rounding Note  PCP-Cardiologist: Loralie Champagne, MD   Subjective:    Admitted with VT storm.   9/24 multiple repeated shocks for VT monomorphic, reintubated, rebolused with amio, quinidine now on hold, started on procainamide.   9/25 Given concern for possible cardiac sarcoid, started empirically on prednisone. IV procainamide discontinued by EP   Remains intubated, sedated.    Stable overnight, no further VT. On amio gtt at 30/hr + VP 0.04. MAPs 80s   CVP 14, CVVH ongoing pulling around 50 cc/hr net negative.   Patient is on heparin gtt for atrial fibrillation.    RHC/LHC (9/20):  Coronary Findings  Diagnostic Dominance: Right Left Anterior Descending  Ost LAD to Prox LAD lesion is 40% stenosed.    First Diagonal Branch  1st Diag lesion is 60% stenosed.    Left Circumflex  Mid Cx lesion is 30% stenosed.    Right Coronary Artery  Prox RCA lesion is 30% stenosed.    Intervention   No interventions have been documented.   Right Heart  Right Heart Pressures RHC Procedural Findings: Hemodynamics (mmHg) RA mean 14 RV 52/15 PA 53/27, mean 37 PCWP mean 26 LV 97/29 AO 103/64  Oxygen saturations: PA 76% AO 100%  Cardiac Output (Fick) 8.98  Cardiac Index (Fick) 3.96 PVR 2.8 WU    Objective:   Weight Range: 120 kg Body mass index is 41.43 kg/m.   Vital Signs:   Temp:  [93.1 F (33.9 C)-98.1 F (36.7 C)] 96.1 F (35.6 C) (09/27 0637) Pulse Rate:  [49-69] 61 (09/27 0500) Resp:  [14-24] 17 (09/27 0637) BP: (114-138)/(62-70) 114/63 (09/26 1548) SpO2:  [92 %-100 %] 92 % (09/27 0500) Arterial Line BP: (92-154)/(45-79) 129/69 (09/27 0637) FiO2 (%):  [40 %] 40 % (09/27 0356) Weight:  [120 kg] 120 kg (09/27 0500) Last BM Date :  (PTA)  Weight change: Filed Weights   08/29/22 0500 08/31/22 0500 09/01/22 0500  Weight: 95.2 kg 96.3 kg 120 kg     Intake/Output:   Intake/Output Summary (Last 24 hours) at 09/01/2022 0747 Last data filed at 09/01/2022 0700 Gross per 24 hour  Intake 3017.55 ml  Output 3470 ml  Net -452.45 ml      Physical Exam   CVP 14  General: Intubated/sedated.  Neck: JVP 12 cm, no thyromegaly or thyroid nodule.  Lungs: Clear to auscultation bilaterally with normal respiratory effort. CV: Nondisplaced PMI.  Heart regular S1/S2, no S3/S4, no murmur.  Trace ankle edema.   Abdomen: Soft, nontender, no hepatosplenomegaly, no distention.  Skin: Intact without lesions or rashes.  Neurologic: Alert and oriented x 3.  Psych: Normal affect. Extremities: No clubbing or cyanosis.  HEENT: Normal.    Telemetry   NSR 50s, personally reviewed  (personally reviewed)   EKG    N/A  Labs    CBC Recent Labs    08/31/22 0450 09/01/22 0354  WBC 11.7* 12.7*  NEUTROABS 10.4* 10.6*  HGB 9.9* 10.4*  HCT 29.2* 30.9*  MCV 97.7 97.8  PLT 180 314   Basic Metabolic Panel Recent Labs    08/30/22 1610 08/31/22 0450 08/31/22 1509 09/01/22 0354  NA  --  130* 133* 134*  K  --  5.3* 5.1 5.6*  CL  --  93* 96* 97*  CO2  --  22 21* 24  GLUCOSE  --  138* 145* 117*  BUN  --  75* 79* 60*  CREATININE  --  10.52* 9.25* 6.22*  CALCIUM  --  8.5* 8.2* 8.3*  MG 2.2 2.3  --   --   PHOS 6.7* 8.0* 7.2* 4.8*   Liver Function Tests Recent Labs    08/29/22 2051 08/30/22 0301 08/31/22 0450 08/31/22 1509 09/01/22 0354  AST 18 9*  --   --   --   ALT 15 14  --   --   --   ALKPHOS 56 54  --   --   --   BILITOT 0.4 0.3  --   --   --   PROT 6.6 6.3*  --   --   --   ALBUMIN 3.1* 2.8*   < > 2.9* 3.1*   < > = values in this interval not displayed.   No results for input(s): "LIPASE", "AMYLASE" in the last 72 hours. Cardiac Enzymes No results for input(s): "CKTOTAL", "CKMB", "CKMBINDEX", "TROPONINI" in the last 72 hours.  BNP: BNP (last 3 results) Recent Labs    05/25/22 0705 08/16/22 0627 08/16/22 1257  BNP  1,355.4* QUANTITY NOT SUFFICIENT, UNABLE TO PERFORM TEST 399.2*    ProBNP (last 3 results) No results for input(s): "PROBNP" in the last 8760 hours.   D-Dimer No results for input(s): "DDIMER" in the last 72 hours. Hemoglobin A1C No results for input(s): "HGBA1C" in the last 72 hours. Fasting Lipid Panel Recent Labs    09/01/22 0354  TRIG 47   Thyroid Function Tests No results for input(s): "TSH", "T4TOTAL", "T3FREE", "THYROIDAB" in the last 72 hours.  Invalid input(s): "FREET3"   Other results:   Imaging    No results found.   Medications:     Scheduled Medications:  aspirin  81 mg Per Tube Daily   atorvastatin  80 mg Per Tube QHS   Chlorhexidine Gluconate Cloth  6 each Topical Q0600   cholecalciferol  5,000 Units Oral Daily   docusate  100 mg Per Tube BID   feeding supplement (PROSource TF20)  60 mL Per Tube BID   fentaNYL (SUBLIMAZE) injection  50 mcg Intravenous Once   mexiletine  200 mg Per Tube BID   multivitamin  1 tablet Per Tube QHS   mouth rinse  15 mL Mouth Rinse Q2H   pantoprazole  40 mg Per Tube Daily   polyethylene glycol  17 g Per Tube Daily   predniSONE  40 mg Per Tube Q breakfast   sodium chloride flush  10-40 mL Intracatheter Q12H    Infusions:   prismasol BGK 4/2.5 400 mL/hr at 09/01/22 0115    prismasol BGK 4/2.5 500 mL/hr at 08/31/22 2227   sodium chloride Stopped (08/26/22 1922)   amiodarone 30 mg/hr (09/01/22 0700)   anticoagulant sodium citrate     feeding supplement (VITAL 1.5 CAL) 50 mL/hr at 09/01/22 0700   fentaNYL infusion INTRAVENOUS 200 mcg/hr (09/01/22 0700)   heparin 950 Units/hr (09/01/22 0700)   prismasol BGK 4/2.5 2,000 mL/hr at 09/01/22 4259   propofol (DIPRIVAN) infusion 15 mcg/kg/min (09/01/22 0700)   vasopressin 0.04 Units/min (09/01/22 0742)    PRN Medications: acetaminophen, albuterol, alteplase, anticoagulant sodium citrate, fentaNYL, heparin, LORazepam, mouth rinse, mouth rinse, sodium chloride  flush    Patient Profile   Mr Mcglothen is 61 year old with a history of VT, ESRD, CAD, ischemic CMP, and ESRD.  He had PCI in 5638, uncertain what vessel was involved. Echo 2/22: EF 25-30% with moderate LV dilation and  normal RV.  He had VT arrest in 2015, has [employer] subcutaneous ICD.  [name] in 2/22.  LHC/RHC was done in 8/22, showing no significant CAD and stable hemodynamics.   Admitted with refractory VT complicated by shock.    Assessment/Plan   1. VT storm: Has has a Animator ICD.  Refractory monomorphic VT. Multiple shocks.  Later alternating NSR with a slow wide complex AIVR-like rhythm. Recurrent monomorphic VT 9/24 w/ repeated shocks>>re-intubated, rebolused with amio, quinidine discontinued, started on procainamide, now off.  Given concern for possible cardiac sarcoid, started empirically on prednisone, currently on 40 mg daily. Cath this admit with nonobstructive CAD. Echo EF 20-25%, GIIIDD (restrictive), RV normal. No further VT, remains on amiodarone gtt 30 mg/hr.  - EP has decided against VT, do not think it would be likely to be successful and high risk.  - Continue amiodarone gtt  - Add mexiletine 200 mg bid, with history of angioedema with lidocaine would like to keep intubated 1 more day.  - No beta blocker for now with hypotension.  - Long term very concerning, not candidate for LVAD with ESRD.   - Continue empiric prednisone 40 mg daily  - Would like eventual cardiac PET for sarcoidosis but can't do at this center yet.  Will need to be at [facility] after discharge.  - Would get CT chest when more stable to look for evidence of pulmonary sarcoidosis.  2. Acute on chronic systolic CHF with cardiogenic shock: In setting of incessant VT.  H/o NICM, echo this admission with EF 20-25%, normal RV function, mild-moderate MR, IVC dilated.  At last admission, unable to complete cardiac MRI due to artifact from [facility] ICD.  PYP scan was negative.  RHC this admit showed volume  overload>>CRRT for volume removal.  Currently on CVVH, CVP 14.  Remains on vasopressin 0.04 to maintain MAP.  - CVVH today pulling net negative 100 cc/hr.   - Wean vasopressin as able today.    3. CAD: ?PCI in 2005, unsure what vessel.  Coronary angiography in 8/22 showed no significant CAD and also does not appear to show prior stent so h/o CAD is questionable. LHC this admission with nonobstructive CAD.  - Continue statin. - No ASA with anticoagulation.  4. ESRD: Since 2/22. TTS HD.  - CVVH today as above, eventually back to HD.  5. Acute hypoxemic respiratory failure: In setting of VT storm.  - Wean sedation to start waking him up, but keep intubated today with start of mexiletine.  6. Atrial fibrillation: Paroxysmal, noted this admission.  - On amiodarone.  - Start Eliquis/stop heparin gtt.    CRITICAL CARE Performed by: Loralie Champagne  Total critical care time: 40 minutes  Critical care time was exclusive of separately billable procedures and treating other patients.  Critical care was necessary to treat or prevent imminent or life-threatening deterioration.  Critical care was time spent personally by me on the following activities: development of treatment plan with patient and/or surrogate as well as nursing, discussions with consultants, evaluation of patient's response to treatment, examination of patient, obtaining history from patient or surrogate, ordering and performing treatments and interventions, ordering and review of laboratory studies, ordering and review of radiographic studies, pulse oximetry and re-evaluation of patient's condition.  Loralie Champagne 09/01/2022. 7:47 AM

## 2022-09-01 NOTE — Progress Notes (Signed)
NAME:  JABIR DAHLEM, MRN:  [id number], DOB:  12/25/1960, LOS: 8 ADMISSION DATE:  08/24/2022, CONSULTATION DATE:  09/01/22 REFERRING MD:  Aundra Dubin - HF, CHIEF COMPLAINT:  VT storm   History of Present Illness:  61 year old male with PMH Systolic HF (followed by Dr. Aundra Dubin, EF 25-30), H/O VT arrest, s/p ICD, on amiodarone. Presented from dialysis 9/19 with palpitations and feeling faint. In ED, recurrent episodes of VT with repeated [place] IVD discharges. Denied chest pain or dyspnea. Refractory despite shock x 3, (along with internal shocks), amiodarone, lopressor, and bolus of procainamide, (allergy to lidocaine with angioedema) Ultimately requiring intubation and paralytics.   Started on quinidine 9/20. Continued improvement. Extubated 9/22. Reintubated 9/24 in the setting of need for increased sedation and recurrent VT (monomorphic).   Pertinent Medical History:   Past Medical History:  Diagnosis Date   Acute on chronic systolic CHF (congestive heart failure) ([place]) 06/14/2020   Acute respiratory failure (HCC) 05/28/2022   AICD (automatic cardioverter/defibrillator) present    2015   Allergy    Asthma    no meds   Cardiac arrest [facility]) 2015   Chest pain 05/25/2022   CHF (congestive heart failure) (HCC)    Coronary artery disease    ESRD on hemodialysis (HCC)    ckd -stage 5   Hyperlipidemia    Hypertension    Myocardial infarction ([place])    Obesity    Pneumonia    Shortness of breath dyspnea    Sleep apnea    USES CPAP   Wears glasses     Significant Hospital Events: Including procedures, antibiotic start and stop dates in addition to other pertinent events   9/19 - Intubated, central line placed 9/20 - R/LHC performed, CVC placed into left internal jugular vein for CRRT 9/21 - No further VT  9/22 - Extubated, off pressors, off sedation, right radial arterial line removed, CRRT dc'd 9/24 - Reintubated in the setting of need for increased sedation and recurrent VT (monomorphic).  Rebolus with amiodarone. Procainamide started. IO placed. CVC placed later in the evening. Aline replaced. Brady/hypotensive overnight, amio decreased. NE/Vaso resumed. 9/25 - Remains on amio/procainamide. Brady in 40s. Off NE, on vaso 0.04. CVP 8-10. Keeping sedated per EP. 9/26 - HR 60s, remains off of NE, on vaso 0.04. CVP 14-15. Goal RASS -2 to -3 per EP. Versed changed to Propofol. Plan for CRRT vs. HD.  Interim History / Subjective:  No events. No storming. Stable pressor needs. Pulling fluid on CRRT.  Objective:  Blood pressure (!) 118/56, pulse 60, temperature (!) 97.2 F (36.2 C), resp. rate 16, height '5\' 7"'$  (1.702 m), weight 120 kg, SpO2 97 %. CVP:  [14 mmHg-28 mmHg] 18 mmHg  Vent Mode: PRVC FiO2 (%):  [40 %] 40 % Set Rate:  [18 bmp] 18 bmp Vt Set:  [520 mL] 520 mL PEEP:  [5 cmH20] 5 cmH20 Plateau Pressure:  [23 cmH20-27 cmH20] 26 cmH20   Intake/Output Summary (Last 24 hours) at 09/01/2022 1004 Last data filed at 09/01/2022 0900 Gross per 24 hour  Intake 2879.25 ml  Output 3856 ml  Net -976.75 ml    Filed Weights   08/29/22 0500 08/31/22 0500 09/01/22 0500  Weight: 95.2 kg 96.3 kg 120 kg   Physical Examination: No distress Follows commands Worsening rhonci and crackles today Mild anasarca Abd soft Heart sounds regular, ext warm  CBG ok K a little high, renal indices improving WBC up slightly Afebrile  Assessment & Plan:  Recurrent ventricular storm  Shock in setting of ventricular storm > resolved  S/p procainamide load 9/19. No sign of ischemic cause on R/LHC, likely due to volume overload with PWCP 26. Patient with recurrent VT (monomorphic), shocked by ICD x 5. - Advanced HF, EP teams following - Antiarrythmics and/or ablation offering per their discussion - Replete electrolytes for goal K > 4, Mg > 2 - Monitor I&Os, goal euvolemia  Acute respiratory failure with hypoxia requiring mechanical ventilation Resting on vent to reduce sympathetic  stimulation in setting of unremitting Vtach storm.  More rhonci today and white count up a bit. - Check tracheal aspirate culture, CXR - Continue vent support today - Tentative plan for SAT/SBT tomorrow  Chronic systolic CHF ESRD on HD Echo 08/25/2022 with EF 20-25%, global hypokinesis, Grade III diastolic dysfunction. - AHF/nephro teams following, appreciate management. - Push fluid removal  Morbid obesity - Nutrition/RD following  Best Practice (right click and "Reselect all SmartList Selections" daily)   Diet/type: TF DVT prophylaxis: DOAC GI prophylaxis: PPI Lines: Central line and Dialysis Catheter Foley:  N/A Code Status:  full code Last date of multidisciplinary goals of care discussion [Per primary team]  34 minutes cc time  Candee Furbish, MD [facility] 09/01/22 10:04 AM  Please see [email or web address] for pager details.  From 7A-7P if no response, please call [phone] After hours, please call ELink [phone]

## 2022-09-01 NOTE — Telephone Encounter (Signed)
Pharmacy Patient Advocate Encounter  Insurance verification completed.    The patient is insured through [employer] Part D   The patient is currently admitted and ran test claims for the following: Mexiletine, Eliquis.  Copays and coinsurance results were relayed to Inpatient clinical team.

## 2022-09-01 NOTE — Progress Notes (Signed)
PT Cancellation Note  Patient Details Name: Victor Thompson MRN: [id number] DOB: [birth year]   Cancelled Treatment:    Reason Eval/Treat Not Completed: Patient not medically ready, intubated and sedated. Will follow-up for PT treatment as appropriate.  Mabeline Caras, PT, DPT Acute Rehabilitation Services  Personal: [facility] Office: [place] 09/01/2022, 9:23 AM

## 2022-09-01 NOTE — Progress Notes (Signed)
Patient weight is reading about 20kg more than the previous day and the days prior.After patient was lifted with a sky lift and bed re-zeroed. weight was 123kg. Patient was then weighed on a new bed which gave weights consistent with the weights from the p[previous bed.

## 2022-09-02 ENCOUNTER — Encounter: Payer: Medicare Other | Admitting: Student

## 2022-09-02 LAB — RENAL FUNCTION PANEL
Albumin: 3.7 g/dL (ref 3.5–5.0)
Albumin: 3.2 g/dL — ABNORMAL LOW (ref 3.5–5.0)
Anion gap: 14 (ref 5–15)
Anion gap: 9 (ref 5–15)
BUN: 39 mg/dL — ABNORMAL HIGH (ref 6–20)
BUN: 29 mg/dL — ABNORMAL HIGH (ref 6–20)
CO2: 26 mmol/L (ref 22–32)
CO2: 25 mmol/L (ref 22–32)
Calcium: 8.5 mg/dL — ABNORMAL LOW (ref 8.9–10.3)
Calcium: 7.6 mg/dL — ABNORMAL LOW (ref 8.9–10.3)
Chloride: 96 mmol/L — ABNORMAL LOW (ref 98–111)
Chloride: 100 mmol/L (ref 98–111)
Creatinine, Ser: 3.34 mg/dL — ABNORMAL HIGH (ref 0.61–1.24)
Creatinine, Ser: 3.09 mg/dL — ABNORMAL HIGH (ref 0.61–1.24)
GFR, Estimated: 21 mL/min — ABNORMAL LOW (ref >60)
GFR, Estimated: 23 mL/min — ABNORMAL LOW (ref >60)
Glucose, Bld: 96 mg/dL (ref 70–99)
Glucose, Bld: 114 mg/dL — ABNORMAL HIGH (ref 70–99)
Phosphorus: 4 mg/dL (ref 2.5–4.6)
Phosphorus: 2.4 mg/dL — ABNORMAL LOW (ref 2.5–4.6)
Potassium: 4.4 mmol/L (ref 3.5–5.1)
Potassium: 3.6 mmol/L (ref 3.5–5.1)
Sodium: 136 mmol/L (ref 135–145)
Sodium: 134 mmol/L — ABNORMAL LOW (ref 135–145)

## 2022-09-02 LAB — HEPARIN LEVEL (UNFRACTIONATED): Heparin Unfractionated: 0.53 IU/mL (ref 0.30–0.70)

## 2022-09-02 LAB — POCT I-STAT 7, (LYTES, BLD GAS, ICA,H+H)
Acid-Base Excess: 1 mmol/L (ref 0.0–2.0)
Acid-Base Excess: 1 mmol/L (ref 0.0–2.0)
Acid-Base Excess: 0 mmol/L (ref 0.0–2.0)
Acid-Base Excess: 0 mmol/L (ref 0.0–2.0)
Acid-base deficit: 1 mmol/L (ref 0.0–2.0)
Bicarbonate: 26.9 mmol/L (ref 20.0–28.0)
Bicarbonate: 24.9 mmol/L (ref 20.0–28.0)
Bicarbonate: 25.7 mmol/L (ref 20.0–28.0)
Bicarbonate: 25 mmol/L (ref 20.0–28.0)
Bicarbonate: 25.3 mmol/L (ref 20.0–28.0)
Calcium, Ion: 1.14 mmol/L — ABNORMAL LOW (ref 1.15–1.40)
Calcium, Ion: 1.14 mmol/L — ABNORMAL LOW (ref 1.15–1.40)
Calcium, Ion: 1.12 mmol/L — ABNORMAL LOW (ref 1.15–1.40)
Calcium, Ion: 1.13 mmol/L — ABNORMAL LOW (ref 1.15–1.40)
Calcium, Ion: 1.14 mmol/L — ABNORMAL LOW (ref 1.15–1.40)
HCT: 28 % — ABNORMAL LOW (ref 39.0–52.0)
HCT: 35 % — ABNORMAL LOW (ref 39.0–52.0)
HCT: 33 % — ABNORMAL LOW (ref 39.0–52.0)
HCT: 31 % — ABNORMAL LOW (ref 39.0–52.0)
HCT: 30 % — ABNORMAL LOW (ref 39.0–52.0)
Hemoglobin: 9.5 g/dL — ABNORMAL LOW (ref 13.0–17.0)
Hemoglobin: 11.9 g/dL — ABNORMAL LOW (ref 13.0–17.0)
Hemoglobin: 11.2 g/dL — ABNORMAL LOW (ref 13.0–17.0)
Hemoglobin: 10.5 g/dL — ABNORMAL LOW (ref 13.0–17.0)
Hemoglobin: 10.2 g/dL — ABNORMAL LOW (ref 13.0–17.0)
O2 Saturation: 94 %
O2 Saturation: 73 %
O2 Saturation: 90 %
O2 Saturation: 90 %
O2 Saturation: 99 %
Patient temperature: 97.5
Patient temperature: 37
Potassium: 4.4 mmol/L (ref 3.5–5.1)
Potassium: 3.8 mmol/L (ref 3.5–5.1)
Potassium: 3.7 mmol/L (ref 3.5–5.1)
Potassium: 3.7 mmol/L (ref 3.5–5.1)
Potassium: 3.9 mmol/L (ref 3.5–5.1)
Sodium: 135 mmol/L (ref 135–145)
Sodium: 136 mmol/L (ref 135–145)
Sodium: 136 mmol/L (ref 135–145)
Sodium: 136 mmol/L (ref 135–145)
Sodium: 136 mmol/L (ref 135–145)
TCO2: 28 mmol/L (ref 22–32)
TCO2: 26 mmol/L (ref 22–32)
TCO2: 27 mmol/L (ref 22–32)
TCO2: 26 mmol/L (ref 22–32)
TCO2: 27 mmol/L (ref 22–32)
pCO2 arterial: 49.4 mmHg — ABNORMAL HIGH (ref 32–48)
pCO2 arterial: 46 mmHg (ref 32–48)
pCO2 arterial: 42.3 mmHg (ref 32–48)
pCO2 arterial: 39.4 mmHg (ref 32–48)
pCO2 arterial: 42.5 mmHg (ref 32–48)
pH, Arterial: 7.342 — ABNORMAL LOW (ref 7.35–7.45)
pH, Arterial: 7.342 — ABNORMAL LOW (ref 7.35–7.45)
pH, Arterial: 7.392 (ref 7.35–7.45)
pH, Arterial: 7.411 (ref 7.35–7.45)
pH, Arterial: 7.383 (ref 7.35–7.45)
pO2, Arterial: 73 mmHg — ABNORMAL LOW (ref 83–108)
pO2, Arterial: 41 mmHg — ABNORMAL LOW (ref 83–108)
pO2, Arterial: 59 mmHg — ABNORMAL LOW (ref 83–108)
pO2, Arterial: 58 mmHg — ABNORMAL LOW (ref 83–108)
pO2, Arterial: 144 mmHg — ABNORMAL HIGH (ref 83–108)

## 2022-09-02 LAB — CBC WITH DIFFERENTIAL/PLATELET
Abs Immature Granulocytes: 0.09 10*3/uL — ABNORMAL HIGH (ref 0.00–0.07)
Basophils Absolute: 0 10*3/uL (ref 0.0–0.1)
Basophils Relative: 0 %
Eosinophils Absolute: 0 10*3/uL (ref 0.0–0.5)
Eosinophils Relative: 0 %
HCT: 27.7 % — ABNORMAL LOW (ref 39.0–52.0)
Hemoglobin: 9.1 g/dL — ABNORMAL LOW (ref 13.0–17.0)
Immature Granulocytes: 1 %
Lymphocytes Relative: 5 %
Lymphs Abs: 0.4 10*3/uL — ABNORMAL LOW (ref 0.7–4.0)
MCH: 32.6 pg (ref 26.0–34.0)
MCHC: 32.9 g/dL (ref 30.0–36.0)
MCV: 99.3 fL (ref 80.0–100.0)
Monocytes Absolute: 1.3 10*3/uL — ABNORMAL HIGH (ref 0.1–1.0)
Monocytes Relative: 15 %
Neutro Abs: 6.6 10*3/uL (ref 1.7–7.7)
Neutrophils Relative %: 79 %
Platelets: 149 10*3/uL — ABNORMAL LOW (ref 150–400)
RBC: 2.79 MIL/uL — ABNORMAL LOW (ref 4.22–5.81)
RDW: 16.4 % — ABNORMAL HIGH (ref 11.5–15.5)
WBC: 8.4 10*3/uL (ref 4.0–10.5)
nRBC: 0 % (ref 0.0–0.2)

## 2022-09-02 LAB — COOXEMETRY PANEL
Carboxyhemoglobin: 1.9 % — ABNORMAL HIGH (ref 0.5–1.5)
Methemoglobin: 0.9 % (ref 0.0–1.5)
O2 Saturation: 80.3 %
Total hemoglobin: 9.7 g/dL — ABNORMAL LOW (ref 12.0–16.0)

## 2022-09-02 LAB — GLUCOSE, CAPILLARY
Glucose-Capillary: 112 mg/dL — ABNORMAL HIGH (ref 70–99)
Glucose-Capillary: 114 mg/dL — ABNORMAL HIGH (ref 70–99)
Glucose-Capillary: 114 mg/dL — ABNORMAL HIGH (ref 70–99)
Glucose-Capillary: 120 mg/dL — ABNORMAL HIGH (ref 70–99)
Glucose-Capillary: 69 mg/dL — ABNORMAL LOW (ref 70–99)
Glucose-Capillary: 77 mg/dL (ref 70–99)
Glucose-Capillary: 99 mg/dL (ref 70–99)

## 2022-09-02 LAB — MAGNESIUM: Magnesium: 2.8 mg/dL — ABNORMAL HIGH (ref 1.7–2.4)

## 2022-09-02 MED ORDER — CARVEDILOL 3.125 MG PO TABS: 3.1250 mg | ORAL_TABLET | Freq: Two times a day (BID) | ORAL | Status: DC

## 2022-09-02 MED ORDER — ALBUMIN HUMAN 25 % IV SOLN
INTRAVENOUS | Status: AC
  Filled 2022-09-02: qty 100

## 2022-09-02 MED ORDER — CALCIUM GLUCONATE-NACL 1-0.675 GM/50ML-% IV SOLN
1.0000 g | Freq: Once | INTRAVENOUS | Status: AC
  Administered 2022-09-02: 1000 mg via INTRAVENOUS
  Filled 2022-09-02: qty 50

## 2022-09-02 MED ORDER — MEXILETINE HCL 200 MG PO CAPS
200.0000 mg | ORAL_CAPSULE | Freq: Once | ORAL | Status: AC
  Administered 2022-09-02: 200 mg via ORAL
  Filled 2022-09-02: qty 1

## 2022-09-02 MED ORDER — SENNOSIDES 8.8 MG/5ML PO SYRP
10.0000 mL | ORAL_SOLUTION | Freq: Two times a day (BID) | ORAL | Status: DC
  Administered 2022-09-03: 10 mL via ORAL
  Filled 2022-09-02 (×2): qty 10

## 2022-09-02 MED ORDER — METOCLOPRAMIDE HCL 5 MG/ML IJ SOLN: 5.0000 mg | Freq: Two times a day (BID) | INTRAMUSCULAR | Status: DC

## 2022-09-02 MED ORDER — ATORVASTATIN CALCIUM 80 MG PO TABS
80.0000 mg | ORAL_TABLET | Freq: Every day | ORAL | Status: DC
  Administered 2022-09-04 – 2022-09-09 (×6): 80 mg via ORAL
  Filled 2022-09-02 (×7): qty 1

## 2022-09-02 MED ORDER — PREDNISONE 20 MG PO TABS
40.0000 mg | ORAL_TABLET | Freq: Every day | ORAL | Status: DC
  Administered 2022-09-03: 40 mg via ORAL
  Filled 2022-09-02: qty 2

## 2022-09-02 MED ORDER — METHYLNALTREXONE BROMIDE 12 MG/0.6ML ~~LOC~~ SOLN
12.0000 mg | Freq: Once | SUBCUTANEOUS | Status: AC
  Administered 2022-09-02: 12 mg via SUBCUTANEOUS
  Filled 2022-09-02: qty 0.6

## 2022-09-02 MED ORDER — RENA-VITE PO TABS
1.0000 | ORAL_TABLET | Freq: Every day | ORAL | Status: DC
  Administered 2022-09-05 – 2022-09-09 (×5): 1 via ORAL
  Filled 2022-09-02 (×6): qty 1

## 2022-09-02 MED ORDER — DEXTROSE 50 % IV SOLN: 12.5000 g | INTRAVENOUS | Status: AC

## 2022-09-02 MED ORDER — AMIODARONE LOAD VIA INFUSION
150.0000 mg | Freq: Once | INTRAVENOUS | Status: AC
  Administered 2022-09-02: 150 mg via INTRAVENOUS
  Filled 2022-09-02: qty 83.34

## 2022-09-02 MED ORDER — DEXTROSE 50 % IV SOLN
INTRAVENOUS | Status: AC
  Administered 2022-09-02: 12.5 g via INTRAVENOUS
  Filled 2022-09-02: qty 50

## 2022-09-02 MED ORDER — POLYETHYLENE GLYCOL 3350 17 G PO PACK
17.0000 g | PACK | Freq: Every day | ORAL | Status: DC
  Administered 2022-09-03 – 2022-09-10 (×3): 17 g via ORAL
  Filled 2022-09-02 (×4): qty 1

## 2022-09-02 MED ORDER — CARVEDILOL 3.125 MG PO TABS
3.1250 mg | ORAL_TABLET | Freq: Two times a day (BID) | ORAL | Status: DC
  Administered 2022-09-02 (×2): 3.125 mg
  Filled 2022-09-02 (×2): qty 1

## 2022-09-02 MED ORDER — PRISMASOL BGK 4/2.5 32-4-2.5 MEQ/L EC SOLN: Status: DC

## 2022-09-02 MED ORDER — PANTOPRAZOLE 2 MG/ML SUSPENSION
40.0000 mg | Freq: Every day | ORAL | Status: DC
  Administered 2022-09-03 – 2022-09-05 (×2): 40 mg via ORAL
  Filled 2022-09-02 (×2): qty 20

## 2022-09-02 MED ORDER — MEXILETINE HCL 200 MG PO CAPS
200.0000 mg | ORAL_CAPSULE | Freq: Two times a day (BID) | ORAL | Status: DC
  Administered 2022-09-03 – 2022-09-06 (×7): 200 mg via ORAL
  Filled 2022-09-02 (×10): qty 1

## 2022-09-02 MED ORDER — ORAL CARE MOUTH RINSE: 15.0000 mL | OROMUCOSAL | Status: DC | PRN

## 2022-09-02 MED ORDER — APIXABAN 5 MG PO TABS
5.0000 mg | ORAL_TABLET | Freq: Once | ORAL | Status: AC
  Administered 2022-09-02: 5 mg via ORAL

## 2022-09-02 MED ORDER — APIXABAN 5 MG PO TABS
5.0000 mg | ORAL_TABLET | Freq: Two times a day (BID) | ORAL | Status: DC
  Administered 2022-09-03 (×2): 5 mg via ORAL
  Filled 2022-09-02 (×3): qty 1

## 2022-09-02 NOTE — Progress Notes (Addendum)
Patient ID: Victor Thompson, male   DOB: November 08, 1961, 61 y.o.   MRN: [id number]     Advanced Heart Failure Rounding Note  PCP-Cardiologist: Loralie Champagne, MD   Subjective:    Admitted with VT storm.   9/24 multiple repeated shocks for VT monomorphic, reintubated, rebolused with amio, quinidine now on hold, started on procainamide.   9/25 Given concern for possible cardiac sarcoid, started empirically on prednisone. IV procainamide discontinued by EP  9/27 started on ceftriaxone for suspected aspiration PNA. Mexiletine started for VT suppression    Remains intubated. Now off sedation. Pressures are high. VP turned off ~1 AM. MAPs 110s. CRRT pulling 150/hr. CVP 10-11. ST low 100s. No VT. Tolerating Mexiletine ok. Remains on amio gtt at [facility] on abx for suspected aspiration PNA, WBC 12>> 8.4. Tracheal aspirate grain statin + for GPC, GNR and GPR. Culture pending.      RHC/LHC (9/20):  Coronary Findings  Diagnostic Dominance: Right Left Anterior Descending  Ost LAD to Prox LAD lesion is 40% stenosed.    First Diagonal Branch  1st Diag lesion is 60% stenosed.    Left Circumflex  Mid Cx lesion is 30% stenosed.    Right Coronary Artery  Prox RCA lesion is 30% stenosed.    Intervention   No interventions have been documented.   Right Heart  Right Heart Pressures RHC Procedural Findings: Hemodynamics (mmHg) RA mean 14 RV 52/15 PA 53/27, mean 37 PCWP mean 26 LV 97/29 AO 103/64  Oxygen saturations: PA 76% AO 100%  Cardiac Output (Fick) 8.98  Cardiac Index (Fick) 3.96 PVR 2.8 WU    Objective:   Weight Range: 116.6 kg Body mass index is 40.26 kg/m.   Vital Signs:   Temp:  [96.1 F (35.6 C)-99 F (37.2 C)] 97.8 F (36.6 C) (09/28 0656) Pulse Rate:  [47-83] 83 (09/28 0700) Resp:  [13-22] 16 (09/28 0700) BP: (118-126)/(53-56) 126/53 (09/27 1640) SpO2:  [89 %-100 %] 98 % (09/28 0700) Arterial Line BP: (83-188)/(40-78) 172/75 (09/28 0700) FiO2 (%):   [40 %] 40 % (09/28 0322) Weight:  [116.6 kg] 116.6 kg (09/28 0609) Last BM Date :  (PTA)  Weight change: Filed Weights   08/31/22 0500 09/01/22 0500 09/02/22 0609  Weight: 96.3 kg 120 kg 116.6 kg    Intake/Output:   Intake/Output Summary (Last 24 hours) at 09/02/2022 0713 Last data filed at 09/02/2022 0700 Gross per 24 hour  Intake 2392.4 ml  Output 5165 ml  Net -2772.6 ml      Physical Exam   CVP 10-11  General: intubated, awake on vent.  Neck: JVP 10 cm, no thyromegaly or thyroid nodule.  Lungs: intubated, course anteriorly  CV: Nondisplaced PMI.  Heart regular S1/S2, no S3/S4, no murmur.  Trace b/l LEE  Abdomen: Soft, nontender, no hepatosplenomegaly, no distention.  Skin: Intact without lesions or rashes.  Neurologic: Intubated, awake on vent  Psych: intubated, awake on vent  Extremities: No clubbing or cyanosis.  HEENT: + ETT    Telemetry   Sinus tach, low 100s. No VT, personally reviewed  (personally reviewed)   EKG    N/A  Labs    CBC Recent Labs    09/01/22 0354 09/02/22 0342  WBC 12.7* 8.4  NEUTROABS 10.6* 6.6  HGB 10.4* 9.1*  9.5*  HCT 30.9* 27.7*  28.0*  MCV 97.8 99.3  PLT 213 462*   Basic Metabolic Panel Recent Labs    08/31/22 0450 08/31/22 1509 09/01/22 1550 09/02/22 0342  NA 130*   < > 136 136  135  K 5.3*   < > 5.1 4.4  4.4  CL 93*   < > 101 96*  CO2 22   < > 26 26  GLUCOSE 138*   < > 133* 96  BUN 75*   < > 49* 39*  CREATININE 10.52*   < > 4.56* 3.34*  CALCIUM 8.5*   < > 8.0* 8.5*  MG 2.3  --   --  2.8*  PHOS 8.0*   < > 3.4 4.0   < > = values in this interval not displayed.   Liver Function Tests Recent Labs    09/01/22 1550 09/02/22 0342  ALBUMIN 3.3* 3.7   No results for input(s): "LIPASE", "AMYLASE" in the last 72 hours. Cardiac Enzymes No results for input(s): "CKTOTAL", "CKMB", "CKMBINDEX", "TROPONINI" in the last 72 hours.  BNP: BNP (last 3 results) Recent Labs    05/25/22 0705 08/16/22 0627  08/16/22 1257  BNP 1,355.4* QUANTITY NOT SUFFICIENT, UNABLE TO PERFORM TEST 399.2*    ProBNP (last 3 results) No results for input(s): "PROBNP" in the last 8760 hours.   D-Dimer No results for input(s): "DDIMER" in the last 72 hours. Hemoglobin A1C No results for input(s): "HGBA1C" in the last 72 hours. Fasting Lipid Panel Recent Labs    09/01/22 0354  TRIG 47   Thyroid Function Tests No results for input(s): "TSH", "T4TOTAL", "T3FREE", "THYROIDAB" in the last 72 hours.  Invalid input(s): "FREET3"   Other results:   Imaging    DG Abd 1 View  Result Date: 09/01/2022 CLINICAL DATA:  Orogastric tube placement EXAM: ABDOMEN - 1 VIEW COMPARISON:  Portable exam 1317 hours compared to 08/29/2022 FINDINGS: Tip of nasogastric tube projects over mid stomach. Nonobstructive bowel gas pattern. Scattered degenerative disc disease changes thoracolumbar spine. IMPRESSION: Tip of nasogastric tube projects over mid stomach. Electronically Signed   By: Lavonia Dana M.D.   On: 09/01/2022 13:49   DG CHEST PORT 1 VIEW  Result Date: 09/01/2022 CLINICAL DATA:  Endotracheal tube. EXAM: PORTABLE CHEST 1 VIEW COMPARISON:  Chest radiograph 08/30/2022 FINDINGS: Magdalene Molly is difficult to visualize but the endotracheal tube appears to be appropriately position. There is a left jugular dialysis catheter with the tip in the left innominate vein. Right jugular central line with the tip in the upper SVC. Stable position of the ICD lead. Stable enlargement of the heart. Persistent left basilar densities could be related atelectasis but difficult to exclude pleural fluid. Central vascular structures are mildly prominent without overt pulmonary edema. Negative for a pneumothorax. IMPRESSION: 1. Endotracheal tube appears to be appropriately positioned. 2. Left jugular dialysis catheter tip is in the left innominate vein. 3. Persistent left basilar densities. Findings could be related to atelectasis but difficult to  exclude pleural fluid. Electronically Signed   By: Markus Daft M.D.   On: 09/01/2022 11:23     Medications:     Scheduled Medications:  apixaban  5 mg Per Tube BID   atorvastatin  80 mg Per Tube QHS   Chlorhexidine Gluconate Cloth  6 each Topical Q0600   cholecalciferol  5,000 Units Oral Daily   docusate  100 mg Per Tube BID   feeding supplement (PROSource TF20)  60 mL Per Tube BID   fentaNYL (SUBLIMAZE) injection  50 mcg Intravenous Once   mexiletine  200 mg Per Tube BID   multivitamin  1 tablet Per Tube QHS   mouth rinse  15 mL Mouth  Rinse Q2H   pantoprazole  40 mg Per Tube Daily   polyethylene glycol  17 g Per Tube Daily   predniSONE  40 mg Per Tube Q breakfast   sennosides  10 mL Per Tube BID   sodium chloride flush  10-40 mL Intracatheter Q12H    Infusions:  sodium chloride Stopped (08/26/22 1922)   albumin human     amiodarone 30 mg/hr (09/02/22 0700)   anticoagulant sodium citrate     cefTRIAXone (ROCEPHIN)  IV Stopped (09/01/22 1817)   feeding supplement (VITAL 1.5 CAL) Stopped (09/01/22 1301)   fentaNYL infusion INTRAVENOUS 100 mcg/hr (09/02/22 0700)   norepinephrine (LEVOPHED) Adult infusion Stopped (09/02/22 0544)   prismasol BGK 2/2.5 dialysis solution 2,000 mL/hr at 09/02/22 0646   prismasol BGK 2/2.5 replacement solution 500 mL/hr at 09/01/22 2126   prismasol BGK 2/2.5 replacement solution 400 mL/hr at 09/01/22 1124   propofol (DIPRIVAN) infusion 10 mcg/kg/min (09/02/22 0700)   vasopressin Stopped (09/02/22 0105)    PRN Medications: acetaminophen, albumin human, albuterol, alteplase, anticoagulant sodium citrate, fentaNYL, heparin, LORazepam, mouth rinse, mouth rinse, sodium chloride flush    Patient Profile   [name] is 61 year old with a history of VT, ESRD, CAD, ischemic CMP, and ESRD. He had PCI in 6237, uncertain what vessel was involved. Echo 2/22: EF 25-30% with moderate LV dilation and normal RV.  He had VT arrest in 2015, has [employer] subcutaneous  ICD.  [name] in 2/22.  LHC/RHC was done in 8/22, showing no significant CAD and stable hemodynamics.   Admitted with refractory VT complicated by shock.    Assessment/Plan   1. VT storm: Has has a Animator ICD.  Refractory monomorphic VT. Multiple shocks.  Later alternating NSR with a slow wide complex AIVR-like rhythm. Recurrent monomorphic VT 9/24 w/ repeated shocks>>re-intubated, rebolused with amio, quinidine discontinued, started on procainamide, now off.  Given concern for possible cardiac sarcoid, started empirically on prednisone, currently on 40 mg daily. Cath this admit with nonobstructive CAD. Echo EF 20-25%, GIIIDD (restrictive), RV normal. No further VT, remains on amiodarone gtt 30 mg/hr.  - EP has decided against VT, do not think it would be likely to be successful and high risk.  - Continue amiodarone gtt  - Mexiletine 200 mg bid added for suppression, continue. Tolerating ok, no signs of angioedema  - No beta blocker for now with hypotension.  - Long term very concerning, not candidate for LVAD with ESRD.   - Continue empiric prednisone 40 mg daily  - Would like eventual cardiac PET for sarcoidosis but can't do at this center yet.  Will need to be at [facility] after discharge.  - Would get CT chest when more stable to look for evidence of pulmonary sarcoidosis.  2. Acute on chronic systolic CHF with cardiogenic shock: In setting of incessant VT.  H/o NICM, echo this admission with EF 20-25%, normal RV function, mild-moderate MR, IVC dilated.  At last admission, unable to complete cardiac MRI due to artifact from [facility] ICD.  PYP scan was negative.  RHC this admit showed volume overload>>CRRT for volume removal.  Currently on CVVH, CVP 10-11.  Currently off VP w/ elevated MAP  - CVVH today pulling net negative 100 cc/hr.   3. CAD: ?PCI in 2005, unsure what vessel.  Coronary angiography in 8/22 showed no significant CAD and also does not appear to show prior stent so h/o CAD is  questionable. LHC this admission with nonobstructive  CAD.  - Continue statin. - No ASA with anticoagulation.  4. ESRD: Since 2/22. TTS HD.  - CVVH today as above, eventually back to HD.  5. Acute hypoxemic respiratory failure: In setting of VT storm.  - try to extubate today  6. Atrial fibrillation: Paroxysmal, noted this admission.  - On amiodarone.  - Continue Eliquis  5 mg bid  7. ID: Ceftriaxone for possible aspiration PNA.   Lyda Jester, PA-C  09/02/2022. 7:13 AM  Patient seen with PA, agree with the above note.   He had a short NSVT run last night and some PVCs noted.  Also possibly some AF this morning. Currently NSR in 90s.    He is awake on vent with elevated BP, off pressors.  CVP 11, CVVH pulling 150 cc/hr.   General: Awake on vent Neck: JVP difficult, no thyromegaly or thyroid nodule.  Lungs: Occasional rhonchi.  CV: Nondisplaced PMI.  Heart regular S1/S2, no S3/S4, no murmur.  No peripheral edema.   Abdomen: Soft, nontender, no hepatosplenomegaly, no distention.  Skin: Intact without lesions or rashes.  Neurologic: Follows commands Extremities: No clubbing or cyanosis.  HEENT: Normal.   Awake on vent, off pressors.  Hopefully extubate today.   Run of NSVT but otherwise quiescent.  Continue amiodarone gtt + mexiletine.  Off pressors with elevated BP, will add Coreg 3.125 mg bid.   Continue CVVH today, can aim for 100 cc/hr net negative UF.  CVP 11.   Ceftriaxone to continue for aspiration PNA coverage.   Eliquis for AF, generally in NSR.   CRITICAL CARE Performed by: Loralie Champagne  Total critical care time: 35 minutes  Critical care time was exclusive of separately billable procedures and treating other patients.  Critical care was necessary to treat or prevent imminent or life-threatening deterioration.  Critical care was time spent personally by me on the following activities: development of treatment plan with patient and/or surrogate as well as  nursing, discussions with consultants, evaluation of patient's response to treatment, examination of patient, obtaining history from patient or surrogate, ordering and performing treatments and interventions, ordering and review of laboratory studies, ordering and review of radiographic studies, pulse oximetry and re-evaluation of patient's condition.  Loralie Champagne 09/02/2022 7:51 AM

## 2022-09-02 NOTE — Progress Notes (Signed)
NAME:  Victor Thompson, MRN:  [id number], DOB:  12-18-1960, LOS: 9 ADMISSION DATE:  08/24/2022, CONSULTATION DATE:  09/02/22 REFERRING MD:  Aundra Dubin - HF, CHIEF COMPLAINT:  VT storm   History of Present Illness:  61 year old male with PMH Systolic HF (followed by Dr. Aundra Dubin, EF 25-30), H/O VT arrest, s/p ICD, on amiodarone. Presented from dialysis 9/19 with palpitations and feeling faint. In ED, recurrent episodes of VT with repeated Bandera IVD discharges. Denied chest pain or dyspnea. Refractory despite shock x 3, (along with internal shocks), amiodarone, lopressor, and bolus of procainamide, (allergy to lidocaine with angioedema) Ultimately requiring intubation and paralytics.   Started on quinidine 9/20. Continued improvement. Extubated 9/22. Reintubated 9/24 in the setting of need for increased sedation and recurrent VT (monomorphic).   Pertinent Medical History:   Past Medical History:  Diagnosis Date   Acute on chronic systolic CHF (congestive heart failure) ([place]) 06/14/2020   Acute respiratory failure (HCC) 05/28/2022   AICD (automatic cardioverter/defibrillator) present    2015   Allergy    Asthma    no meds   Cardiac arrest [facility]) 2015   Chest pain 05/25/2022   CHF (congestive heart failure) (HCC)    Coronary artery disease    ESRD on hemodialysis (HCC)    ckd -stage 5   Hyperlipidemia    Hypertension    Myocardial infarction ([place])    Obesity    Pneumonia    Shortness of breath dyspnea    Sleep apnea    USES CPAP   Wears glasses     Significant Hospital Events: Including procedures, antibiotic start and stop dates in addition to other pertinent events   9/19 - Intubated, central line placed 9/20 - R/LHC performed, CVC placed into left internal jugular vein for CRRT 9/21 - No further VT  9/22 - Extubated, off pressors, off sedation, right radial arterial line removed, CRRT dc'd 9/24 - Reintubated in the setting of need for increased sedation and recurrent VT (monomorphic).  Rebolus with amiodarone. Procainamide started. IO placed. CVC placed later in the evening. Aline replaced. Brady/hypotensive overnight, amio decreased. NE/Vaso resumed. 9/25 - Remains on amio/procainamide. Brady in 58s. Off NE, on vaso 0.04. CVP 8-10. Keeping sedated per EP. 9/26 - HR 60s, remains off of NE, on vaso 0.04. CVP 14-15. Goal RASS -2 to -3 per EP. Versed changed to Propofol. Plan for CRRT vs. HD.  Interim History / Subjective:  No events, awake on vent. Tolerating SBT well. No VT  Objective:  Blood pressure (!) 89/79, pulse (!) 111, temperature 98.2 F (36.8 C), temperature source Axillary, resp. rate (!) 25, height '5\' 7"'$  (1.702 m), weight 116.6 kg, SpO2 (!) 88 %. CVP:  [0 mmHg-17 mmHg] 3 mmHg  Vent Mode: PSV;CPAP FiO2 (%):  [40 %] 40 % Set Rate:  [18 bmp] 18 bmp Vt Set:  [520 mL-851 mL] 851 mL PEEP:  [5 cmH20] 5 cmH20 Pressure Support:  [5 cmH20] 5 cmH20 Plateau Pressure:  [19 cmH20-30 cmH20] 23 cmH20   Intake/Output Summary (Last 24 hours) at 09/02/2022 1052 Last data filed at 09/02/2022 1000 Gross per 24 hour  Intake 2003.66 ml  Output 5037 ml  Net -3033.34 ml    Filed Weights   08/31/22 0500 09/01/22 0500 09/02/22 0609  Weight: 96.3 kg 120 kg 116.6 kg   Physical Examination: No distress Rhonci are a bit better today Still hypoactive BS and slight abd ditrension Moves all 4 ext to command RASS 0  Had  an episode of hypoglycemia with TF being off  Assessment & Plan:  Recurrent ventricular storm  Shock in setting of ventricular storm > resolved  S/p procainamide load 9/19. No sign of ischemic cause on R/LHC, likely due to volume overload with PWCP 26. Patient with recurrent VT (monomorphic), shocked by ICD x 5. - Advanced HF, EP teams following - Antiarrythmics and/or ablation offering per their discussion - Replete electrolytes for goal K > 4, Mg > 2 - Monitor I&Os, goal euvolemia  Acute respiratory failure with hypoxia requiring mechanical  ventilation Resting on vent to reduce sympathetic stimulation in setting of unremitting Vtach storm.  Suspicion for aspiration from gastric immotility vs. HCAP 09/01/22.  - Continue rocephin, f/u tracheal aspirate. CXR not impressive. - SAT/SBT and potential extubation today  Chronic systolic CHF ESRD on HD Echo 08/25/2022 with EF 20-25%, global hypokinesis, Grade III diastolic dysfunction. - AHF/nephro teams following, appreciate management. - Push fluid removal  Constipation vs. Gastroparesis- KUB benign.  Promotility agents not ideal in setting of long QT and recurrent VT - Relistor and bowel regimen as ordered  Morbid obesity - Nutrition/RD following  Best Practice (right click and "Reselect all SmartList Selections" daily)   Diet/type: TF DVT prophylaxis: DOAC GI prophylaxis: PPI Lines: Central line and Dialysis Catheter Foley:  N/A Code Status:  full code Last date of multidisciplinary goals of care discussion [Per primary team]  38 minutes cc time  Candee Furbish, MD [facility] 09/02/22 10:52 AM  Please see [email or web address] for pager details.  From 7A-7P if no response, please call [phone] After hours, please call ELink [phone]

## 2022-09-02 NOTE — Progress Notes (Signed)
[facility] Progress Note   Assessment/ Plan:    [facility]  4h 87mn  450/ 1.5  116.4kg  AVF  15g  Hep 3000 - hectorol 7 ug iv tiw - no esa     Assessment/ Plan: VT storm / VF - pt rx'd w/ IV amio, po quinidine, sedation / intubation, paralytics and procainamide. Had more VT/ shocks/ amio.  On IV amio, procainamide stopped and now mexilitine started 9/27 Shock/ hypotension - on vaso ESRD - on HD TTS. SP CRRT 9/20- 9/22. SP regular HD Saturday. Continue CRRT for another 24-48 hrs or so until volume is optimized and then try IHD. He struggles with fluids as an OP and does not adhere to his PO medications.  If he is unable to take his amio and mexilitine, there's a high risk of him not being able to tolerate IHD d/t recurrent VT storm (which I think is exacerbated by volume overload).  Will reassess tomorrow to see if taking off CRRT is feasible and converting to IHD.   HFrEF / EF 20-25%/ sp ICD - not LVAD candidate due to esrd Volume - as above Anemia esrd - Hb 9.9, follow and give ESA if needed MBD ckd - CCa in range, phos a bit high.  Will resume auryxia 1 ac tid for now (usual dose is 2 ac). Phos is 4-5 range.   Subjective:    Tolerating mexilitine well.  On CRRT.  Some NSVT and Afib last night but nothing sustained.   Objective:   BP (!) 152/72   Pulse 92   Temp 98.2 F (36.8 C) (Axillary)   Resp 16   Ht '5\' 7"'$  (1.702 m)   Wt 116.6 kg   SpO2 94%   BMI 40.26 kg/m   Physical Exam: [id number] awake CVS: RRR Resp: clear Abd: soft Ext: trace LE edema ACCESS: L AVF + T/B  Labs: BMET Recent Labs  Lab 08/29/22 2051 08/29/22 2235 08/30/22 0301 08/30/22 1610 08/31/22 0450 08/31/22 1509 09/01/22 0354 09/01/22 1550 09/02/22 0342  NA 134* 131* 133*  --  130* 133* 134* 136 136  135  K 5.3* 4.0 5.1  --  5.3* 5.1 5.6* 5.1 4.4  4.4  CL 94*  --  94*  --  93* 96* 97* 101 96*  CO2 23  --  24  --  22 21* '24 26 26  '$ GLUCOSE 107*  --  147*  --  138*  145* 117* 133* 96  BUN 38*  --  42*  --  75* 79* 60* 49* 39*  CREATININE 7.76*  --  8.31*  --  10.52* 9.25* 6.22* 4.56* 3.34*  CALCIUM 8.9  --  8.8*  --  8.5* 8.2* 8.3* 8.0* 8.5*  PHOS  --   --  4.3 6.7* 8.0* 7.2* 4.8* 3.4 4.0   CBC Recent Labs  Lab 08/30/22 0301 08/31/22 0450 09/01/22 0354 09/02/22 0342  WBC 7.1 11.7* 12.7* 8.4  NEUTROABS 6.6 10.4* 10.6* 6.6  HGB 9.8* 9.9* 10.4* 9.1*  9.5*  HCT 28.8* 29.2* 30.9* 27.7*  28.0*  MCV 96.3 97.7 97.8 99.3  PLT 197 180 213 149*      Medications:     apixaban  5 mg Per Tube BID   atorvastatin  80 mg Per Tube QHS   carvedilol  3.125 mg Per Tube BID WC   Chlorhexidine Gluconate Cloth  6 each Topical Q0600   cholecalciferol  5,000 Units Oral Daily   docusate  100 mg Per Tube BID   feeding supplement (PROSource TF20)  60 mL Per Tube BID   fentaNYL (SUBLIMAZE) injection  50 mcg Intravenous Once   methylnaltrexone  12 mg Subcutaneous Once   mexiletine  200 mg Per Tube BID   multivitamin  1 tablet Per Tube QHS   mouth rinse  15 mL Mouth Rinse Q2H   pantoprazole  40 mg Per Tube Daily   polyethylene glycol  17 g Per Tube Daily   predniSONE  40 mg Per Tube Q breakfast   sennosides  10 mL Per Tube BID   sodium chloride flush  10-40 mL Intracatheter Q12H     Madelon Lips MD 09/02/2022, 9:42 AM

## 2022-09-02 NOTE — Progress Notes (Signed)
? ?  Inpatient Rehab Admissions Coordinator : ? ?Per therapy recommendations, patient was screened for CIR candidacy by Kandra Graven RN MSN.  At this time patient appears to be a potential candidate for CIR. I will place a rehab consult per protocol for full assessment. Please call me with any questions. ? ?Nazair Fortenberry RN MSN ?Admissions Coordinator ?[phone] ?  ?

## 2022-09-02 NOTE — Progress Notes (Signed)
Physical Therapy Treatment Patient Details Name: Victor Thompson MRN: [id number] DOB: December 18, 1960 Today's Date: 09/02/2022   History of Present Illness Pt is a 61 y.o. male admitted from HD on 08/24/22 with c/o palpitations, feeling faint. In ED, recurrent episodes of VT with repeated ICD discharges. ETT 9/19-9/22. R/LHC on 9/20. CRRT 9/20-9/22, restarted 9/26. 9/24 VT and reintubated. extubated 9/28. PMH includes CHF, AICD, CHF, CAD, ESRD (on HD), HTN, MI, HLD, OSA (uses CPAP), obesity, asthma.    PT Comments    Pt pleasant without awareness of 4 days on vent. Pt hesitant to start mobility but ultimately willing with ability to stand at foot of bed during session. Pt with significant deconditioning since eval with need for increased assist with all mobility and change in D/C plan and goals. Will continue to follow to maximize strength and function. 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 Encouraged bil LE HEP and chair position throughout the day.  HR 88-95 SpO2 desat to 88% with standing trial up to 96% on 100% FiO2 and 35L at rest BP at rest 92/47 (61) Full chair 103/52 (67) 124/64 via art line end of session   Recommendations for follow up therapy are one component of a multi-disciplinary discharge planning process, led by the attending physician.  Recommendations may be updated based on patient status, additional functional criteria and insurance authorization.  Follow Up Recommendations  Acute inpatient rehab (3hours/day)     Assistance Recommended at Discharge Frequent or constant Supervision/Assistance  Patient can return home with the following A lot of help with walking and/or transfers;A lot of help with bathing/dressing/bathroom;Assistance with cooking/housework;Direct supervision/assist for financial management;Help with stairs or ramp for entrance   Equipment Recommendations  Rolling walker (2 wheels)    Recommendations for Other Services        Precautions / Restrictions Precautions Precautions: Fall;Other (comment) Precaution Comments: Watch SpO2, CRRT     Mobility  Bed Mobility Overal bed mobility: Needs Assistance Bed Mobility: Supine to Sit, Sit to Supine, Rolling Rolling: Mod assist         General bed mobility comments: utilized foot egress to transition to full chair and back to supine. Max +2 to slide toward [facility] end of session with assist of pt with bil LE    Transfers Overall transfer level: Needs assistance   Transfers: Sit to/from Stand Sit to Stand: Min assist           General transfer comment: min assist with bil UE on rails and increased time to fully clear sacrum, unable to stand unsupported grossly 20 sec in standing limited by drop in SpO2 to 85% needing seated rest and cues for breathing technique to recover    Ambulation/Gait                   Stairs             Wheelchair Mobility    Modified Rankin (Stroke Patients Only)       Balance Overall balance assessment: Needs assistance Sitting-balance support: Bilateral upper extremity supported Sitting balance-Leahy Scale: Fair Sitting balance - Comments: sitting with reliance on UE support   Standing balance support: Bilateral upper extremity supported   Standing balance comment: bil UE support on rails                            Cognition Arousal/Alertness: Awake/alert Behavior During Therapy: [facility] for tasks  assessed/performed Overall Cognitive Status: Impaired/Different from baseline Area of Impairment: Orientation, Memory                 Orientation Level: Disoriented to, Time Current Attention Level: Sustained Memory: Decreased short-term memory Following Commands: Follows one step commands consistently       General Comments: pt with slow processing, not oriented to time and unaware of recent events of admission        Exercises General Exercises - Lower Extremity Long Arc  Quad: AROM, Both, Seated, 15 reps Hip ABduction/ADduction: AROM, Both, 15 reps, Seated Hip Flexion/Marching: AROM, Both, 15 reps, Seated    General Comments        Pertinent Vitals/Pain Pain Assessment Pain Assessment: No/denies pain    Home Living                          Prior Function            PT Goals (current goals can now be found in the care plan section) Acute Rehab PT Goals Time For Goal Achievement: 09/16/22 Potential to Achieve Goals: Good Progress towards PT goals: Goals downgraded-see care plan (medical complexity with complicated course)    Frequency    Min 3X/week      PT Plan Discharge plan needs to be updated    Co-evaluation              AM-PAC PT "6 Clicks" Mobility   Outcome Measure  Help needed turning from your back to your side while in a flat bed without using bedrails?: A Lot Help needed moving from lying on your back to sitting on the side of a flat bed without using bedrails?: A Lot Help needed moving to and from a bed to a chair (including a wheelchair)?: Total Help needed standing up from a chair using your arms (e.g., wheelchair or bedside chair)?: A Lot Help needed to walk in hospital room?: Total Help needed climbing 3-5 steps with a railing? : Total 6 Click Score: 9    End of Session   Activity Tolerance: Patient tolerated treatment well Patient left: in bed;with call bell/phone within reach;with nursing/sitter in room (in chair position) Nurse Communication: Mobility status PT Visit Diagnosis: Other abnormalities of gait and mobility (R26.89);Muscle weakness (generalized) (M62.81)     Time: 2778-2423 PT Time Calculation (min) (ACUTE ONLY): 31 min  Charges:  $Therapeutic Activity: 8-22 mins                     [name], PT Acute Rehabilitation Services Office: [phone]    Sandy Salaam Marco Raper 09/02/2022, 1:38 PM

## 2022-09-02 NOTE — Procedures (Signed)
Extubation Procedure Note  Patient Details:   Name: Victor Thompson DOB: 1960/12/26 MRN: [id number]   Airway Documentation:    Vent end date: 09/02/22 Vent end time: 1000   Evaluation  O2 sats: stable throughout Complications: No apparent complications Patient did tolerate procedure well. Bilateral Breath Sounds: Rhonchi, Coarse crackles   Yes  Pt extubated to [place] with RN at bedside. Positive cuff leak. Pt's sats dropped to 86, ABG drawn and pt placed on heated high flow 35l/ 100% per MD. RT will monitor.  Cathie Olden 09/02/2022, 10:49 AM

## 2022-09-02 NOTE — Consult Note (Signed)
   [facility] Inpatient Consult   09/02/2022  TALON REGALA 1961/11/01 [id number]   [place] [ACO] Patient:  [employer] Medicare   Primary Care Provider: [name], MD, [facility], is listed fto have TOC follow up needs   Patient screened for length of stay hospitalization with noted extreme high risk score for unplanned readmission risk.  Reviewed today to assess for post hospital transition for readmission prevention needs.  Review of patient's medical record reveals patient is was extubated today. Noted encounter and notes from [place]. Reviewed progress notes from [facility] RNCM as well.    Plan:  Continue to follow progress and disposition to assess for post hospital care management needs.  Referral request can be made if appropriate for post hospital TOC needs.  Disposition is currently not determined awaiting PT/OT evaluation.  Can follow up with [place] team when appropriate.   For questions contact:    Natividad Brood, RN BSN [place]  [phone] business mobile phone Toll free office [phone]  *[place]  [phone] Fax number: [phone] [email or web address]

## 2022-09-02 NOTE — Progress Notes (Signed)
Electrophysiology Rounding Note  [name] Date of Encounter: 09/02/2022  Primary Cardiologist: Loralie Champagne, MD Electrophysiologist: Cristopher Peru, MD   Subjective   NAEON. Remains intubated sedated this AM, opens eyes intermittently.  Inpatient Medications    Scheduled Meds:  apixaban  5 mg Per Tube BID   atorvastatin  80 mg Per Tube QHS   carvedilol  3.125 mg Per Tube BID WC   Chlorhexidine Gluconate Cloth  6 each Topical Q0600   cholecalciferol  5,000 Units Oral Daily   docusate  100 mg Per Tube BID   feeding supplement (PROSource TF20)  60 mL Per Tube BID   fentaNYL (SUBLIMAZE) injection  50 mcg Intravenous Once   mexiletine  200 mg Per Tube BID   multivitamin  1 tablet Per Tube QHS   mouth rinse  15 mL Mouth Rinse Q2H   pantoprazole  40 mg Per Tube Daily   polyethylene glycol  17 g Per Tube Daily   predniSONE  40 mg Per Tube Q breakfast   sennosides  10 mL Per Tube BID   sodium chloride flush  10-40 mL Intracatheter Q12H   Continuous Infusions:  sodium chloride Stopped (08/26/22 1922)   amiodarone 30 mg/hr (09/02/22 0700)   anticoagulant sodium citrate     cefTRIAXone (ROCEPHIN)  IV Stopped (09/01/22 1817)   feeding supplement (VITAL 1.5 CAL) Stopped (09/01/22 1301)   fentaNYL infusion INTRAVENOUS 100 mcg/hr (09/02/22 0700)   norepinephrine (LEVOPHED) Adult infusion Stopped (09/02/22 0544)   prismasol BGK 2/2.5 dialysis solution 2,000 mL/hr at 09/02/22 0646   prismasol BGK 2/2.5 replacement solution 500 mL/hr at 09/01/22 2126   prismasol BGK 2/2.5 replacement solution 400 mL/hr at 09/01/22 1124   propofol (DIPRIVAN) infusion 10 mcg/kg/min (09/02/22 0700)   vasopressin Stopped (09/02/22 0105)   PRN Meds: acetaminophen, albuterol, alteplase, anticoagulant sodium citrate, fentaNYL, heparin, LORazepam, mouth rinse, mouth rinse, sodium chloride flush   Vital Signs    Vitals:   09/02/22 0609 09/02/22 0656 09/02/22 0700 09/02/22 0800  BP:       Pulse:   83   Resp:   16   Temp:  97.8 F (36.6 C)  98.2 F (36.8 C)  TempSrc:  Oral  Axillary  SpO2:   98%   Weight: 116.6 kg     Height:        Intake/Output Summary (Last 24 hours) at 09/02/2022 0823 Last data filed at 09/02/2022 0700 Gross per 24 hour  Intake 2245.58 ml  Output 4973 ml  Net -2727.42 ml    Filed Weights   08/31/22 0500 09/01/22 0500 09/02/22 0609  Weight: 96.3 kg 120 kg 116.6 kg    Physical Exam    Physical exam is largely unchanged from prior GEN- The patient is intubated, sedated.  Opens eyes intermittently, does not track; Morbidly obese  HEENT- + ET tube.  Lungs- in vent, clear b/l Heart- RRR, no murmurs, rubs or gallops GI- soft Extremities- no clubbing or cyanosis. trace edema Skin- no rash or lesion Neuro- intubated, opens eyes   Labs    CBC Recent Labs    09/01/22 0354 09/02/22 0342  WBC 12.7* 8.4  NEUTROABS 10.6* 6.6  HGB 10.4* 9.1*  9.5*  HCT 30.9* 27.7*  28.0*  MCV 97.8 99.3  PLT 213 149*    Basic Metabolic Panel Recent Labs    08/31/22 0450 08/31/22 1509 09/01/22 1550 09/02/22 0342  NA 130*   < > 136 136  135  K  5.3*   < > 5.1 4.4  4.4  CL 93*   < > 101 96*  CO2 22   < > 26 26  GLUCOSE 138*   < > 133* 96  BUN 75*   < > 49* 39*  CREATININE 10.52*   < > 4.56* 3.34*  CALCIUM 8.5*   < > 8.0* 8.5*  MG 2.3  --   --  2.8*  PHOS 8.0*   < > 3.4 4.0   < > = values in this interval not displayed.    Liver Function Tests Recent Labs    09/01/22 1550 09/02/22 0342  ALBUMIN 3.3* 3.7    No results for input(s): "LIPASE", "AMYLASE" in the last 72 hours. Cardiac Enzymes No results for input(s): "CKTOTAL", "CKMB", "CKMBINDEX", "TROPONINI" in the last 72 hours.   Telemetry    SB - rates in 50-60s until about 5:45am, then steadily increasing (corresponds to weaning) Now SR/ST, 80-100s bpm. Two episodes of NSVT less than 10 beats (personally reviewed)  Radiology     08/25/22: [facility] Left Anterior Descending   Ost LAD to Prox LAD lesion is 40% stenosed.    First Diagonal Branch  1st Diag lesion is 60% stenosed.    Left Circumflex  Mid Cx lesion is 30% stenosed.    Right Coronary Artery  Prox RCA lesion is 30% stenosed.     Right Heart Pressures RHC Procedural Findings: Hemodynamics (mmHg) RA mean 14 RV 52/15 PA 53/27, mean 37 PCWP mean 26 LV 97/29 AO 103/64  Oxygen saturations: PA 76% AO 100%  Cardiac Output (Fick) 8.98  Cardiac Index (Fick) 3.96 PVR 2.8 WU    08/25/2022: TTE  1. Left ventricular ejection fraction, by estimation, is 20 to 25%. The  left ventricle has severely decreased function. The left ventricle  demonstrates global hypokinesis. The left ventricular internal cavity size  was severely dilated. Left ventricular  diastolic parameters are consistent with Grade III diastolic dysfunction  (restrictive). Elevated left ventricular end-diastolic pressure.   2. Right ventricular systolic function is normal. The right ventricular  size is mildly enlarged. There is mildly elevated pulmonary artery  systolic pressure.   3. Left atrial size was moderately dilated.   4. Right atrial size was mildly dilated.   5. The mitral valve is normal in structure. Mild to moderate mitral valve  regurgitation. No evidence of mitral stenosis.   6. The aortic valve is grossly normal. Aortic valve regurgitation is not  visualized. No aortic stenosis is present.   7. The inferior vena cava is dilated in size with <50% respiratory  variability, suggesting right atrial pressure of 15 mmHg.   8. Cannot exclude a small PFO.   Comparison(s): Changes from prior study are noted. LV chamber now severely  dilated (7 cm).    10/21/21: TTE  1. Left ventricular ejection fraction, by estimation, is 25 to 30%. The  left ventricle has severely decreased function. The left ventricle  demonstrates global hypokinesis. The left ventricular internal cavity size  was moderately dilated. Left   ventricular diastolic parameters are consistent with Grade I diastolic  dysfunction (impaired relaxation).   2. Right ventricular systolic function is normal. The right ventricular  size is normal. There is normal pulmonary artery systolic pressure. The  estimated right ventricular systolic pressure is 09.9 mmHg.   3. The mitral valve is normal in structure. Mild mitral valve  regurgitation. No evidence of mitral stenosis.   4. The aortic valve is tricuspid. Aortic valve  regurgitation is not  visualized. No aortic stenosis is present.   5. The inferior vena cava is normal in size with greater than 50%  respiratory variability, suggesting right atrial pressure of 3 mmHg.    07/08/21; LHC 1. Normal filling pressures and normal cardiac output.  2. No significant CAD.  Nonischemic cardiomyopathy.   Patient Profile     Victor Thompson is a 61 y.o. male with a hx of  hx of VF arrest (2015), CAD (PCI in 2005, unknown details), ICM,. Chronic CHF (systolic), ESRF on HD , OSA w/CPAP, HTN, HLD, recurrent angio edema who was admitted for another VT storm.    Assessment & Plan    1.  VT storm / refractory VT More VT this weekend, was shocked 9/24 by both ICD and external defib Re-intubated and sedated Amio gtt at 30 Failed quinidine Off procainamide without an oral option to move to Has angioedema with lidocaine. Extensive discussion with pharmacy regarding safety of mexiletine use. Received one dose 9/19 w/o s/s of angioedema. No s/s of allergic reaction - continue mexiletine '200mg'$  BID Begin waking up and weaning vent  Dr. Myles Gip discussed at length with EP partners including Dr. Lovena Le -- too high risk for VT ablation, unlikely to be successful. No plans for ablation at this time   2. Chronic systolic CHF AHF team is on board Coronary angiography in 07/2021 showed no significant CAD, and also did not appear to show prior "stent" which had been reported.  R/LHC 08/25/2022 with Moderate non  obstructive CAD and elevated filling pressures as above.  PYP scan negative  Had been planned for PET as outpatient for possible Sarcoid.  ECG with IVCD 150 msec, thought not to be good candidate for CRT upgrade (not true LBBB) Currently off pressors - HF team restarted coreg Started on steroids if perhaps/possible sarcoid playing a role  3. ESRD Nephrology following.  Re-initiated CRRT   4. Acute hypoxemic respiratory failure Re-intubated, sedated Appreciate HF and CCM care.   5. AFib/flutter  Over the weekend, back in [place] is 3 Started on heparin 9/26 --> on eliquis currently   For questions or updates, please contact [facility] Please consult [email or web address] for contact info under Cardiology/STEMI.  Signed, Mamie Levers, NP  09/02/2022, 8:23 AM

## 2022-09-02 NOTE — Progress Notes (Signed)
Pt placed on PS/CPAP 5/5 on 40% and is tolerating well. RT will monitor. 

## 2022-09-02 NOTE — Progress Notes (Signed)
PT Cancellation Note  Patient Details Name: AJAMU MAXON MRN: [id number] DOB: 12/29/1960   Cancelled Treatment:    Reason Eval/Treat Not Completed: Patient not medically ready (remains intubated and sedated, await wean and hopeful extubation)   Catarino Vold B Mukesh Kornegay 09/02/2022, 9:32 AM [place] Office: [phone]

## 2022-09-03 ENCOUNTER — Encounter (HOSPITAL_COMMUNITY): Payer: Medicare Other

## 2022-09-03 ENCOUNTER — Inpatient Hospital Stay (HOSPITAL_COMMUNITY): Payer: Medicare Other

## 2022-09-03 LAB — RENAL FUNCTION PANEL
Albumin: 3.1 g/dL — ABNORMAL LOW (ref 3.5–5.0)
Albumin: 2.9 g/dL — ABNORMAL LOW (ref 3.5–5.0)
Anion gap: 9 (ref 5–15)
Anion gap: 13 (ref 5–15)
BUN: 32 mg/dL — ABNORMAL HIGH (ref 6–20)
BUN: 38 mg/dL — ABNORMAL HIGH (ref 6–20)
CO2: 26 mmol/L (ref 22–32)
CO2: 23 mmol/L (ref 22–32)
Calcium: 8.6 mg/dL — ABNORMAL LOW (ref 8.9–10.3)
Calcium: 8.5 mg/dL — ABNORMAL LOW (ref 8.9–10.3)
Chloride: 102 mmol/L (ref 98–111)
Chloride: 102 mmol/L (ref 98–111)
Creatinine, Ser: 3.11 mg/dL — ABNORMAL HIGH (ref 0.61–1.24)
Creatinine, Ser: 3.09 mg/dL — ABNORMAL HIGH (ref 0.61–1.24)
GFR, Estimated: 22 mL/min — ABNORMAL LOW (ref >60)
GFR, Estimated: 23 mL/min — ABNORMAL LOW (ref >60)
Glucose, Bld: 117 mg/dL — ABNORMAL HIGH (ref 70–99)
Glucose, Bld: 128 mg/dL — ABNORMAL HIGH (ref 70–99)
Phosphorus: 2.9 mg/dL (ref 2.5–4.6)
Phosphorus: 2.8 mg/dL (ref 2.5–4.6)
Potassium: 3.9 mmol/L (ref 3.5–5.1)
Potassium: 3.9 mmol/L (ref 3.5–5.1)
Sodium: 137 mmol/L (ref 135–145)
Sodium: 138 mmol/L (ref 135–145)

## 2022-09-03 LAB — POCT I-STAT 7, (LYTES, BLD GAS, ICA,H+H)
Acid-Base Excess: 2 mmol/L (ref 0.0–2.0)
Acid-base deficit: 2 mmol/L (ref 0.0–2.0)
Bicarbonate: 26.4 mmol/L (ref 20.0–28.0)
Bicarbonate: 22.5 mmol/L (ref 20.0–28.0)
Calcium, Ion: 1.17 mmol/L (ref 1.15–1.40)
Calcium, Ion: 1.14 mmol/L — ABNORMAL LOW (ref 1.15–1.40)
HCT: 31 % — ABNORMAL LOW (ref 39.0–52.0)
HCT: 28 % — ABNORMAL LOW (ref 39.0–52.0)
Hemoglobin: 10.5 g/dL — ABNORMAL LOW (ref 13.0–17.0)
Hemoglobin: 9.5 g/dL — ABNORMAL LOW (ref 13.0–17.0)
O2 Saturation: 96 %
O2 Saturation: 97 %
Patient temperature: 98
Patient temperature: 97.7
Potassium: 3.9 mmol/L (ref 3.5–5.1)
Potassium: 3.3 mmol/L — ABNORMAL LOW (ref 3.5–5.1)
Sodium: 136 mmol/L (ref 135–145)
Sodium: 138 mmol/L (ref 135–145)
TCO2: 28 mmol/L (ref 22–32)
TCO2: 24 mmol/L (ref 22–32)
pCO2 arterial: 41.5 mmHg (ref 32–48)
pCO2 arterial: 37.4 mmHg (ref 32–48)
pH, Arterial: 7.411 (ref 7.35–7.45)
pH, Arterial: 7.385 (ref 7.35–7.45)
pO2, Arterial: 83 mmHg (ref 83–108)
pO2, Arterial: 88 mmHg (ref 83–108)

## 2022-09-03 LAB — CBC WITH DIFFERENTIAL/PLATELET
Abs Immature Granulocytes: 0 10*3/uL (ref 0.00–0.07)
Basophils Absolute: 0 10*3/uL (ref 0.0–0.1)
Basophils Relative: 0 %
Eosinophils Absolute: 0 10*3/uL (ref 0.0–0.5)
Eosinophils Relative: 0 %
HCT: 30.4 % — ABNORMAL LOW (ref 39.0–52.0)
Hemoglobin: 10.2 g/dL — ABNORMAL LOW (ref 13.0–17.0)
Lymphocytes Relative: 0 %
Lymphs Abs: 0 10*3/uL — ABNORMAL LOW (ref 0.7–4.0)
MCH: 33 pg (ref 26.0–34.0)
MCHC: 33.6 g/dL (ref 30.0–36.0)
MCV: 98.4 fL (ref 80.0–100.0)
Monocytes Absolute: 0.9 10*3/uL (ref 0.1–1.0)
Monocytes Relative: 5 %
Neutro Abs: 16.4 10*3/uL — ABNORMAL HIGH (ref 1.7–7.7)
Neutrophils Relative %: 95 %
Platelets: 177 10*3/uL (ref 150–400)
RBC: 3.09 MIL/uL — ABNORMAL LOW (ref 4.22–5.81)
RDW: 16.2 % — ABNORMAL HIGH (ref 11.5–15.5)
WBC: 17.3 10*3/uL — ABNORMAL HIGH (ref 4.0–10.5)
nRBC: 0 % (ref 0.0–0.2)
nRBC: 0 /100 WBC

## 2022-09-03 LAB — CULTURE, RESPIRATORY W GRAM STAIN

## 2022-09-03 LAB — GLUCOSE, CAPILLARY
Glucose-Capillary: 103 mg/dL — ABNORMAL HIGH (ref 70–99)
Glucose-Capillary: 114 mg/dL — ABNORMAL HIGH (ref 70–99)
Glucose-Capillary: 116 mg/dL — ABNORMAL HIGH (ref 70–99)
Glucose-Capillary: 119 mg/dL — ABNORMAL HIGH (ref 70–99)
Glucose-Capillary: 124 mg/dL — ABNORMAL HIGH (ref 70–99)
Glucose-Capillary: 133 mg/dL — ABNORMAL HIGH (ref 70–99)
Glucose-Capillary: 136 mg/dL — ABNORMAL HIGH (ref 70–99)

## 2022-09-03 LAB — MAGNESIUM: Magnesium: 2.8 mg/dL — ABNORMAL HIGH (ref 1.7–2.4)

## 2022-09-03 MED ORDER — AMIODARONE IV BOLUS ONLY 150 MG/100ML
150.0000 mg | Freq: Once | INTRAVENOUS | Status: DC
  Administered 2022-09-03: 150 mg via INTRAVENOUS

## 2022-09-03 MED ORDER — LORAZEPAM 2 MG/ML IJ SOLN
1.0000 mg | Freq: Once | INTRAMUSCULAR | Status: AC
  Administered 2022-09-03: 1 mg via INTRAVENOUS
  Filled 2022-09-03: qty 1

## 2022-09-03 MED ORDER — CHLORHEXIDINE GLUCONATE CLOTH 2 % EX PADS
6.0000 | MEDICATED_PAD | Freq: Every day | CUTANEOUS | Status: DC
  Administered 2022-09-03 – 2022-09-05 (×3): 6 via TOPICAL

## 2022-09-03 MED ORDER — PRISMASOL BGK 4/2.5 32-4-2.5 MEQ/L REPLACEMENT SOLN: Status: DC

## 2022-09-03 MED ORDER — SODIUM CHLORIDE 0.9 % IV SOLN
1.0000 g | INTRAVENOUS | Status: AC
  Administered 2022-09-03 – 2022-09-07 (×5): 1 g via INTRAVENOUS
  Filled 2022-09-03 (×5): qty 10

## 2022-09-03 MED ORDER — SODIUM CHLORIDE 0.9 % IV SOLN
2.0000 g | Freq: Two times a day (BID) | INTRAVENOUS | Status: DC
  Administered 2022-09-03: 2 g via INTRAVENOUS
  Filled 2022-09-03: qty 12.5

## 2022-09-03 MED ORDER — AMIODARONE LOAD VIA INFUSION
150.0000 mg | Freq: Once | INTRAVENOUS | Status: DC
  Filled 2022-09-03: qty 83.34

## 2022-09-03 MED ORDER — SODIUM CHLORIDE 0.9 % IV SOLN: 2.0000 g | Freq: Once | INTRAVENOUS | Status: DC

## 2022-09-03 MED ORDER — MELATONIN 3 MG PO TABS
3.0000 mg | ORAL_TABLET | Freq: Every day | ORAL | Status: DC
  Administered 2022-09-03 – 2022-09-09 (×6): 3 mg via ORAL
  Filled 2022-09-03 (×6): qty 1

## 2022-09-03 MED ORDER — BISACODYL 10 MG RE SUPP
10.0000 mg | Freq: Once | RECTAL | Status: AC
  Administered 2022-09-03: 10 mg via RECTAL
  Filled 2022-09-03: qty 1

## 2022-09-03 MED ORDER — METHYLNALTREXONE BROMIDE 12 MG/0.6ML ~~LOC~~ SOLN
12.0000 mg | Freq: Once | SUBCUTANEOUS | Status: AC
  Administered 2022-09-03: 12 mg via SUBCUTANEOUS
  Filled 2022-09-03: qty 0.6

## 2022-09-03 MED ORDER — CARVEDILOL 3.125 MG PO TABS
3.1250 mg | ORAL_TABLET | Freq: Once | ORAL | Status: AC
  Administered 2022-09-03: 3.125 mg via ORAL
  Filled 2022-09-03: qty 1

## 2022-09-03 MED ORDER — CARVEDILOL 6.25 MG PO TABS
6.2500 mg | ORAL_TABLET | Freq: Two times a day (BID) | ORAL | Status: DC
  Administered 2022-09-03 – 2022-09-04 (×2): 6.25 mg via ORAL
  Filled 2022-09-03 (×2): qty 1

## 2022-09-03 MED ORDER — ONDANSETRON HCL 4 MG/2ML IJ SOLN
4.0000 mg | Freq: Four times a day (QID) | INTRAMUSCULAR | Status: AC
  Administered 2022-09-03 (×2): 4 mg via INTRAVENOUS
  Filled 2022-09-03: qty 2

## 2022-09-03 NOTE — Progress Notes (Signed)
Patient ID: Victor Thompson, male   DOB: 1961/09/15, 61 y.o.   MRN: [id number]     Advanced Heart Failure Rounding Note  PCP-Cardiologist: Loralie Champagne, MD   Subjective:    Admitted with VT storm.   9/24 multiple repeated shocks for VT monomorphic, reintubated, rebolused with amio, quinidine now on hold, started on procainamide.   9/25 Given concern for possible cardiac sarcoid, started empirically on prednisone. IV procainamide discontinued by EP  9/27 started on ceftriaxone for suspected aspiration PNA. Mexiletine started for VT suppression   9/28 Extubated  Extubated, remains on 60% HFNC.  He is on ceftriaxone for aspiration PNA.  WBCs 17, afebrile. Trach aspirate with MSSA and Morganella.   CVVH net negative 100 cc/hr UF, weight down 9 lbs, CVP 6.    RHC/LHC (9/20):  Coronary Findings  Diagnostic Dominance: Right Left Anterior Descending  Ost LAD to Prox LAD lesion is 40% stenosed.    First Diagonal Branch  1st Diag lesion is 60% stenosed.    Left Circumflex  Mid Cx lesion is 30% stenosed.    Right Coronary Artery  Prox RCA lesion is 30% stenosed.    Intervention   No interventions have been documented.   Right Heart  Right Heart Pressures RHC Procedural Findings: Hemodynamics (mmHg) RA mean 14 RV 52/15 PA 53/27, mean 37 PCWP mean 26 LV 97/29 AO 103/64  Oxygen saturations: PA 76% AO 100%  Cardiac Output (Fick) 8.98  Cardiac Index (Fick) 3.96 PVR 2.8 WU    Objective:   Weight Range: 112.8 kg Body mass index is 38.95 kg/m.   Vital Signs:   Temp:  [98.2 F (36.8 C)-99.1 F (37.3 C)] 98.3 F (36.8 C) (09/29 0400) Pulse Rate:  [68-116] 68 (09/29 0700) Resp:  [14-26] 18 (09/29 0700) BP: (78-143)/(38-79) 119/65 (09/29 0422) SpO2:  [85 %-100 %] 100 % (09/29 0700) Arterial Line BP: (75-205)/(37-86) 162/66 (09/29 0700) FiO2 (%):  [50 %-100 %] 50 % (09/29 0123) Weight:  [112.8 kg] 112.8 kg (09/29 0656) Last BM Date :  (PTA)  Weight  change: Filed Weights   09/01/22 0500 09/02/22 0609 09/03/22 0656  Weight: 120 kg 116.6 kg 112.8 kg    Intake/Output:   Intake/Output Summary (Last 24 hours) at 09/03/2022 0743 Last data filed at 09/03/2022 0700 Gross per 24 hour  Intake 1117.19 ml  Output 3787 ml  Net -2669.81 ml      Physical Exam   CVP 6 General: NAD Neck: No JVD, no thyromegaly or thyroid nodule.  Lungs: Clear to auscultation bilaterally with normal respiratory effort. CV: Nondisplaced PMI.  Heart regular S1/S2, no S3/S4, no murmur.  No peripheral edema.   Abdomen: Soft, nontender, no hepatosplenomegaly, no distention.  Skin: Intact without lesions or rashes.  Neurologic: Alert and oriented x 3.  Psych: Normal affect. Extremities: No clubbing or cyanosis.  HEENT: Normal.    Telemetry   NSR 70s, suspect atrial flutter/fibrillation yesterday pm. No VT (personally reviewed)   EKG    N/A  Labs    CBC Recent Labs    09/02/22 0342 09/02/22 1016 09/03/22 0424 09/03/22 0429  WBC 8.4  --   --  17.3*  NEUTROABS 6.6  --   --  16.4*  HGB 9.1*  9.5*   < > 10.5* 10.2*  HCT 27.7*  28.0*   < > 31.0* 30.4*  MCV 99.3  --   --  98.4  PLT 149*  --   --  177   < > =  values in this interval not displayed.   Basic Metabolic Panel Recent Labs    09/02/22 0342 09/02/22 1016 09/02/22 1625 09/02/22 2211 09/03/22 0424 09/03/22 0429  NA 136  135   < > 134*   < > 136 137  K 4.4  4.4   < > 3.6   < > 3.9 3.9  CL 96*  --  100  --   --  102  CO2 26  --  25  --   --  26  GLUCOSE 96  --  114*  --   --  117*  BUN 39*  --  29*  --   --  32*  CREATININE 3.34*  --  3.09*  --   --  3.11*  CALCIUM 8.5*  --  7.6*  --   --  8.6*  MG 2.8*  --   --   --   --  2.8*  PHOS 4.0  --  2.4*  --   --  2.9   < > = values in this interval not displayed.   Liver Function Tests Recent Labs    09/02/22 1625 09/03/22 0429  ALBUMIN 3.2* 3.1*   No results for input(s): "LIPASE", "AMYLASE" in the last 72 hours. Cardiac  Enzymes No results for input(s): "CKTOTAL", "CKMB", "CKMBINDEX", "TROPONINI" in the last 72 hours.  BNP: BNP (last 3 results) Recent Labs    05/25/22 0705 08/16/22 0627 08/16/22 1257  BNP 1,355.4* QUANTITY NOT SUFFICIENT, UNABLE TO PERFORM TEST 399.2*    ProBNP (last 3 results) No results for input(s): "PROBNP" in the last 8760 hours.   D-Dimer No results for input(s): "DDIMER" in the last 72 hours. Hemoglobin A1C No results for input(s): "HGBA1C" in the last 72 hours. Fasting Lipid Panel Recent Labs    09/01/22 0354  TRIG 47   Thyroid Function Tests No results for input(s): "TSH", "T4TOTAL", "T3FREE", "THYROIDAB" in the last 72 hours.  Invalid input(s): "FREET3"   Other results:   Imaging    No results found.   Medications:     Scheduled Medications:  apixaban  5 mg Oral BID   atorvastatin  80 mg Oral QHS   carvedilol  3.125 mg Oral BID WC   Chlorhexidine Gluconate Cloth  6 each Topical Q0600   cholecalciferol  5,000 Units Oral Daily   feeding supplement (PROSource TF20)  60 mL Per Tube BID   fentaNYL (SUBLIMAZE) injection  50 mcg Intravenous Once   mexiletine  200 mg Oral BID   multivitamin  1 tablet Oral QHS   pantoprazole  40 mg Oral Daily   polyethylene glycol  17 g Oral Daily   predniSONE  40 mg Oral Q breakfast   sennosides  10 mL Oral BID   sodium chloride flush  10-40 mL Intracatheter Q12H    Infusions:  sodium chloride Stopped (08/26/22 1922)   amiodarone 60 mg/hr (09/03/22 0700)   anticoagulant sodium citrate     cefTRIAXone (ROCEPHIN)  IV Stopped (09/02/22 1746)   norepinephrine (LEVOPHED) Adult infusion Stopped (09/02/22 0544)   prismasol BGK 2/2.5 replacement solution 500 mL/hr at 09/02/22 1450   prismasol BGK 2/2.5 replacement solution 400 mL/hr at 09/03/22 0200   prismasol BGK 4/2.5 1,500 mL/hr at 09/03/22 0600   vasopressin Stopped (09/02/22 1720)    PRN Medications: acetaminophen, albuterol, alteplase, anticoagulant sodium  citrate, fentaNYL, heparin, LORazepam, mouth rinse, sodium chloride flush    Patient Profile   Mr Sommers is 61 year old with a  history of VT, ESRD, CAD, ischemic CMP, and ESRD. He had PCI in 7782, uncertain what vessel was involved. Echo 2/22: EF 25-30% with moderate LV dilation and normal RV.  He had VT arrest in 2015, has [employer] subcutaneous ICD.  [name] in 2/22.  LHC/RHC was done in 8/22, showing no significant CAD and stable hemodynamics.   Admitted with refractory VT complicated by shock.    Assessment/Plan   1. VT storm: Has has a Animator ICD.  Refractory monomorphic VT. Multiple shocks.  Later alternating NSR with a slow wide complex AIVR-like rhythm. Recurrent monomorphic VT 9/24 w/ repeated shocks>>re-intubated, rebolused with amio, quinidine discontinued, started on procainamide, now off.  Given concern for possible cardiac sarcoid, started empirically on prednisone, currently on 40 mg daily. Cath this admit with nonobstructive CAD. Echo EF 20-25%, GIIIDD (restrictive), RV normal. No further VT, remains on amiodarone gtt 60 mg/hr.  - EP has decided against VT ablation, do not think it would be likely to be successful and high risk.  - Continue amiodarone gtt but decrease to 30 mg/hr today.  - Mexiletine 200 mg bid added for suppression, continue. Tolerating ok, no signs of angioedema  - Coreg 3.125 mg bid as long as BP stable.  - Long term very concerning, not candidate for LVAD with ESRD.   - Continue empiric prednisone 40 mg daily  - Would like eventual cardiac PET for sarcoidosis but can't do at this center yet.  Will need to be at [facility] after discharge.  - Would get CT chest when more stable to look for evidence of pulmonary sarcoidosis.  2. Acute on chronic systolic CHF with cardiogenic shock: In setting of incessant VT.  H/o NICM, echo this admission with EF 20-25%, normal RV function, mild-moderate MR, IVC dilated.  At last admission, unable to complete cardiac  MRI due to artifact from [facility] ICD.  PYP scan was negative.  RHC this admit showed volume overload>>CRRT for volume removal.  Currently on CVVH, CVP 6. UF 100 cc/hr net.  Off pressors.  Weight down.  - With CVP 6, can lower CVVH goal to 50 cc/hr net negative and likely ready to transition back to iHD.  3. CAD: ?PCI in 2005, unsure what vessel.  Coronary angiography in 8/22 showed no significant CAD and also does not appear to show prior stent so h/o CAD is questionable. LHC this admission with nonobstructive CAD.  - Continue statin. - No ASA with anticoagulation.  4. ESRD: Since 2/22. TTS HD.  - Ongoing CVVH, decrease UF rate to 50 cc/hr net with CVP lower.  - Suspect we can transition back to [facility] for his usual run tomorrow.  5. ID: Suspect aspiration PNA.  Growing MSSA and Morganella from sputum.  Afebrile with WBCs 17.  - CXR today.  - Transition antibiotics to cefepime.   6. Atrial fibrillation: Paroxysmal, noted this admission. Had AFL yesterday pm.   - On amiodarone, decrease to 30 mg/hr today.  - Continue Eliquis  5 mg bid   CRITICAL CARE Performed by: Loralie Champagne  Total critical care time: 35 minutes  Critical care time was exclusive of separately billable procedures and treating other patients.  Critical care was necessary to treat or prevent imminent or life-threatening deterioration.  Critical care was time spent personally by me on the following activities: development of treatment plan with patient and/or surrogate as well as nursing, discussions with consultants, evaluation of patient's response to treatment, examination of patient,  obtaining history from patient or surrogate, ordering and performing treatments and interventions, ordering and review of laboratory studies, ordering and review of radiographic studies, pulse oximetry and re-evaluation of patient's condition.  Loralie Champagne 09/03/2022 7:43 AM

## 2022-09-03 NOTE — Progress Notes (Addendum)
[facility] Progress Note   Assessment/ Plan:    [facility]  4h 33mn  450/ 1.5  116.4kg  AVF  15g  Hep 3000 - hectorol 7 ug iv tiw - no esa     Assessment/ Plan: VT storm / VF - pt rx'd w/ IV amio, po quinidine, sedation / intubation, paralytics and procainamide. Had more VT/ shocks/ amio.  On IV amio, procainamide stopped and now mexilitine started 9/27.  Not thought to be a VT ablation candidate.  Empiric pred 40 mg daily in case sarcoid is contributory (had restrictive pattern on TTE) Afib: on Eliquis Shock/ hypotension - off vaso, on cefepime ESRD - on HD TTS. SP CRRT 9/20- 9/22. SP regular HD Saturday. Will stop CRRT this evening and will switch to IHD tomorrow 9/30.  Have placed orders for lower than typical BFR, UF goal 1-2 L HFrEF / EF 20-25%/ sp ICD - not LVAD candidate due to esrd Volume - as above Anemia esrd - Hb 9.9, follow and give ESA if needed MBD ckd - CCa in range, phos a bit high.  Will resume auryxia 1 ac tid for now (usual dose is 2 ac). Phos is 4-5 range.   Subjective:    Seen in room.  Extubated, sitting in chair. Fiancee at bedside.  CRRT clotted, being restarted.     Objective:   BP 130/68 (BP Location: Left Arm)   Pulse 88   Temp 98.2 F (36.8 C) (Oral)   Resp 18   Ht '5\' 7"'$  (1.702 m)   Wt 112.8 kg   SpO2 96%   BMI 38.95 kg/m   Physical Exam: Gen: sitting in chair, awake CVS: RRR Resp: shallow breaths  but clear anteriorly Abd: soft Ext: trace LE edema ACCESS: L AVF + T/B  Labs: BMET Recent Labs  Lab 08/31/22 0450 08/31/22 1509 09/01/22 0354 09/01/22 1550 09/02/22 0342 09/02/22 1016 09/02/22 1143 09/02/22 1527 09/02/22 1625 09/02/22 2211 09/03/22 0424 09/03/22 0429  NA 130* 133* 134* 136 136  135 136 136 136 134* 136 136 137  K 5.3* 5.1 5.6* 5.1 4.4  4.4 3.8 3.7 3.7 3.6 3.9 3.9 3.9  CL 93* 96* 97* 101 96*  --   --   --  100  --   --  102  CO2 22 21* '24 26 26  '$ --   --   --  25  --   --  26  GLUCOSE 138*  145* 117* 133* 96  --   --   --  114*  --   --  117*  BUN 75* 79* 60* 49* 39*  --   --   --  29*  --   --  32*  CREATININE 10.52* 9.25* 6.22* 4.56* 3.34*  --   --   --  3.09*  --   --  3.11*  CALCIUM 8.5* 8.2* 8.3* 8.0* 8.5*  --   --   --  7.6*  --   --  8.6*  PHOS 8.0* 7.2* 4.8* 3.4 4.0  --   --   --  2.4*  --   --  2.9   CBC Recent Labs  Lab 08/31/22 0450 09/01/22 0354 09/02/22 0342 09/02/22 1016 09/02/22 1527 09/02/22 2211 09/03/22 0424 09/03/22 0429  WBC 11.7* 12.7* 8.4  --   --   --   --  17.3*  NEUTROABS 10.4* 10.6* 6.6  --   --   --   --  16.4*  HGB 9.9* 10.4* 9.1*  9.5*   < > 10.5* 10.2* 10.5* 10.2*  HCT 29.2* 30.9* 27.7*  28.0*   < > 31.0* 30.0* 31.0* 30.4*  MCV 97.7 97.8 99.3  --   --   --   --  98.4  PLT 180 213 149*  --   --   --   --  177   < > = values in this interval not displayed.      Medications:     apixaban  5 mg Oral BID   atorvastatin  80 mg Oral QHS   carvedilol  6.25 mg Oral BID WC   Chlorhexidine Gluconate Cloth  6 each Topical Q0600   cholecalciferol  5,000 Units Oral Daily   feeding supplement (PROSource TF20)  60 mL Per Tube BID   fentaNYL (SUBLIMAZE) injection  50 mcg Intravenous Once   mexiletine  200 mg Oral BID   multivitamin  1 tablet Oral QHS   pantoprazole  40 mg Oral Daily   polyethylene glycol  17 g Oral Daily   predniSONE  40 mg Oral Q breakfast   sennosides  10 mL Oral BID   sodium chloride flush  10-40 mL Intracatheter Q12H     Madelon Lips MD 09/03/2022, 10:16 AM

## 2022-09-03 NOTE — Progress Notes (Signed)
Electrophysiology Rounding Note  [name] Date of Encounter: 09/03/2022  Primary Cardiologist: Loralie Champagne, MD Electrophysiologist: Cristopher Peru, MD   Subjective   NAEON. Patient sitting in chair with visitor. No acute complaints or concerns.  Inpatient Medications    Scheduled Meds:  apixaban  5 mg Oral BID   atorvastatin  80 mg Oral QHS   carvedilol  3.125 mg Oral Once   carvedilol  6.25 mg Oral BID WC   Chlorhexidine Gluconate Cloth  6 each Topical Q0600   cholecalciferol  5,000 Units Oral Daily   feeding supplement (PROSource TF20)  60 mL Per Tube BID   fentaNYL (SUBLIMAZE) injection  50 mcg Intravenous Once   mexiletine  200 mg Oral BID   multivitamin  1 tablet Oral QHS   pantoprazole  40 mg Oral Daily   polyethylene glycol  17 g Oral Daily   predniSONE  40 mg Oral Q breakfast   sennosides  10 mL Oral BID   sodium chloride flush  10-40 mL Intracatheter Q12H   Continuous Infusions:  sodium chloride Stopped (08/26/22 1922)   amiodarone 30 mg/hr (09/03/22 0759)   anticoagulant sodium citrate     ceFEPime (MAXIPIME) IV     norepinephrine (LEVOPHED) Adult infusion Stopped (09/02/22 0544)   prismasol BGK 2/2.5 replacement solution 500 mL/hr at 09/02/22 1450   prismasol BGK 2/2.5 replacement solution 400 mL/hr at 09/03/22 0200   prismasol BGK 4/2.5 1,500 mL/hr at 09/03/22 0600   vasopressin Stopped (09/02/22 1720)   PRN Meds: acetaminophen, albuterol, alteplase, anticoagulant sodium citrate, fentaNYL, heparin, LORazepam, mouth rinse, sodium chloride flush   Vital Signs    Vitals:   09/03/22 0600 09/03/22 0656 09/03/22 0700 09/03/22 0702  BP:    130/68  Pulse: 72  68 67  Resp: '19  18 17  '$ Temp:    98.2 F (36.8 C)  TempSrc:    Oral  SpO2: 100%  100% 100%  Weight:  112.8 kg    Height:        Intake/Output Summary (Last 24 hours) at 09/03/2022 0814 Last data filed at 09/03/2022 0700 Gross per 24 hour  Intake 1036.64 ml  Output 3503 ml   Net -2466.36 ml    Filed Weights   09/01/22 0500 09/02/22 0609 09/03/22 0656  Weight: 120 kg 116.6 kg 112.8 kg    Physical Exam    GEN- Sitting in chair, Morbidly obese  HEENT- [place] in place, benign otherwise Lungs- CTA Heart- RRR, no murmurs, rubs or gallops GI- soft Extremities- no clubbing or cyanosis. no edema Skin- no rash or lesion Neuro- answers questions appropriately, falls asleep quickly during lull in conversation  Labs    CBC Recent Labs    09/02/22 0342 09/02/22 1016 09/03/22 0424 09/03/22 0429  WBC 8.4  --   --  17.3*  NEUTROABS 6.6  --   --  16.4*  HGB 9.1*  9.5*   < > 10.5* 10.2*  HCT 27.7*  28.0*   < > 31.0* 30.4*  MCV 99.3  --   --  98.4  PLT 149*  --   --  177   < > = values in this interval not displayed.    Basic Metabolic Panel Recent Labs    09/02/22 0342 09/02/22 1016 09/02/22 1625 09/02/22 2211 09/03/22 0424 09/03/22 0429  NA 136  135   < > 134*   < > 136 137  K 4.4  4.4   < > 3.6   < >  3.9 3.9  CL 96*  --  100  --   --  102  CO2 26  --  25  --   --  26  GLUCOSE 96  --  114*  --   --  117*  BUN 39*  --  29*  --   --  32*  CREATININE 3.34*  --  3.09*  --   --  3.11*  CALCIUM 8.5*  --  7.6*  --   --  8.6*  MG 2.8*  --   --   --   --  2.8*  PHOS 4.0  --  2.4*  --   --  2.9   < > = values in this interval not displayed.    Liver Function Tests Recent Labs    09/02/22 1625 09/03/22 0429  ALBUMIN 3.2* 3.1*    No results for input(s): "LIPASE", "AMYLASE" in the last 72 hours. Cardiac Enzymes No results for input(s): "CKTOTAL", "CKMB", "CKMBINDEX", "TROPONINI" in the last 72 hours.   Telemetry    Yesterday ~1400, Afib/flutter with ?SVT, baseline rhythm is a wide-complex QRS Now SR with rates in 80-100s, frequent PVCs  Radiology     08/25/22: [facility] Left Anterior Descending  Ost LAD to Prox LAD lesion is 40% stenosed.    First Diagonal Branch  1st Diag lesion is 60% stenosed.    Left Circumflex  Mid Cx lesion is  30% stenosed.    Right Coronary Artery  Prox RCA lesion is 30% stenosed.     Right Heart Pressures RHC Procedural Findings: Hemodynamics (mmHg) RA mean 14 RV 52/15 PA 53/27, mean 37 PCWP mean 26 LV 97/29 AO 103/64  Oxygen saturations: PA 76% AO 100%  Cardiac Output (Fick) 8.98  Cardiac Index (Fick) 3.96 PVR 2.8 WU    08/25/2022: TTE  1. Left ventricular ejection fraction, by estimation, is 20 to 25%. The  left ventricle has severely decreased function. The left ventricle  demonstrates global hypokinesis. The left ventricular internal cavity size  was severely dilated. Left ventricular  diastolic parameters are consistent with Grade III diastolic dysfunction  (restrictive). Elevated left ventricular end-diastolic pressure.   2. Right ventricular systolic function is normal. The right ventricular  size is mildly enlarged. There is mildly elevated pulmonary artery  systolic pressure.   3. Left atrial size was moderately dilated.   4. Right atrial size was mildly dilated.   5. The mitral valve is normal in structure. Mild to moderate mitral valve  regurgitation. No evidence of mitral stenosis.   6. The aortic valve is grossly normal. Aortic valve regurgitation is not  visualized. No aortic stenosis is present.   7. The inferior vena cava is dilated in size with <50% respiratory  variability, suggesting right atrial pressure of 15 mmHg.   8. Cannot exclude a small PFO.   Comparison(s): Changes from prior study are noted. LV chamber now severely  dilated (7 cm).    10/21/21: TTE  1. Left ventricular ejection fraction, by estimation, is 25 to 30%. The  left ventricle has severely decreased function. The left ventricle  demonstrates global hypokinesis. The left ventricular internal cavity size  was moderately dilated. Left  ventricular diastolic parameters are consistent with Grade I diastolic  dysfunction (impaired relaxation).   2. Right ventricular systolic function  is normal. The right ventricular  size is normal. There is normal pulmonary artery systolic pressure. The  estimated right ventricular systolic pressure is 16.1 mmHg.   3. The mitral  valve is normal in structure. Mild mitral valve  regurgitation. No evidence of mitral stenosis.   4. The aortic valve is tricuspid. Aortic valve regurgitation is not  visualized. No aortic stenosis is present.   5. The inferior vena cava is normal in size with greater than 50%  respiratory variability, suggesting right atrial pressure of 3 mmHg.    07/08/21; LHC 1. Normal filling pressures and normal cardiac output.  2. No significant CAD.  Nonischemic cardiomyopathy.   Patient Profile     Victor Thompson is a 61 y.o. male with a hx of  hx of VF arrest (2015), CAD (PCI in 2005, unknown details), ICM,. Chronic CHF (systolic), ESRF on HD , OSA w/CPAP, HTN, HLD, recurrent angio edema who was admitted for another VT storm.    Assessment & Plan    1.  VT storm / refractory VT More VT this weekend, was shocked 9/24 by both ICD and external defib Re-intubated and sedated Amio gtt increased yesterday to 60 during episode of afib/flutter/?SVT - increased coreg to 6.'25mg'$  BID Failed quinidine Off procainamide without an oral option to move to Has h/o angioedema with lidocaine. Extensive discussion with pharmacy regarding safety of mexiletine use. Received one dose 9/19 w/o s/s of angioedema. No current s/s of allergic reaction - continue mexiletine '200mg'$  BID   Dr. Myles Gip discussed at length with EP partners including Dr. Lovena Le -- too high risk for VT ablation, unlikely to be successful. No plans for ablation at this time   2. Chronic systolic CHF AHF team is on board Coronary angiography in 07/2021 showed no significant CAD, and also did not appear to show prior "stent" which had been reported.  R/LHC 08/25/2022 with Moderate non obstructive CAD and elevated filling pressures as above.  PYP scan negative  Had  been planned for PET as outpatient for possible Sarcoid.  ECG with IVCD 150 msec, thought not to be good candidate for CRT upgrade (not true LBBB) Currently off pressors - HF team restarted coreg, increased as above Started on steroids if perhaps/possible sarcoid playing a role  3. ESRD Nephrology following.  Re-initiated CRRT   4. Acute hypoxemic respiratory failure Re-intubated, sedated Appreciate HF and CCM care.   5. AFib/flutter  Over the weekend, back in SR Episode afternoon 9/28 with concern for afib/flutter CHA2DS2Vasc is 3 Started on heparin 9/26 --> on eliquis currently   For questions or updates, please contact [place] Please consult [email or web address] for contact info under Cardiology/STEMI.  Signed, Mamie Levers, NP  09/03/2022, 8:14 AM

## 2022-09-03 NOTE — Evaluation (Signed)
Occupational Therapy Evaluation Patient Details Name: Victor Thompson MRN: [id number] DOB: 10/06/1961 Today's Date: 09/03/2022   History of Present Illness Pt is a 61 y.o. male admitted from HD on 08/24/22 with c/o palpitations, feeling faint. In ED, recurrent episodes of VT with repeated ICD discharges. ETT 9/19-9/22. R/LHC on 9/20. CRRT 9/20-9/22, restarted 9/26. 9/24 VT and reintubated. extubated 9/28. PMH includes CHF, AICD, CHF, CAD, ESRD (on HD), HTN, MI, HLD, OSA (uses CPAP), obesity, asthma.   Clinical Impression   This 61 yo male admitted with above presents to acute OT with PLOF of being totally independent with all basic ADLs, IADLs, and driving. He currently is Mod A-total A for basic ADLs at a bed level. He will continue to benefit from acute OT with follow up on AIR.      Recommendations for follow up therapy are one component of a multi-disciplinary discharge planning process, led by the attending physician.  Recommendations may be updated based on patient status, additional functional criteria and insurance authorization.   Follow Up Recommendations  Acute inpatient rehab (3hours/day)    Assistance Recommended at Discharge Frequent or constant Supervision/Assistance  Patient can return home with the following Two people to help with walking and/or transfers;A lot of help with bathing/dressing/bathroom;Assistance with cooking/housework;Assistance with feeding;Help with stairs or ramp for entrance;Assist for transportation;Direct supervision/assist for financial management;Direct supervision/assist for medications management    Functional Status Assessment  Patient has had a recent decline in their functional status and demonstrates the ability to make significant improvements in function in a reasonable and predictable amount of time.  Equipment Recommendations  Other (comment) (TBD next venue)    Recommendations for Other Services Rehab consult     Precautions /  Restrictions Precautions Precautions: Fall;Other (comment) Precaution Comments: Watch SpO2, HHFNC, CRRT Restrictions Weight Bearing Restrictions: No      Mobility Bed Mobility               General bed mobility comments: Did not attempt due to patient;s CRRT clotted off earlier and he had emesis and RN did not want him moving much            ADL either performed or assessed with clinical judgement   ADL Overall ADL's : Needs assistance/impaired Eating/Feeding: Moderate assistance;Bed level   Grooming: Moderate assistance;Bed level   Upper Body Bathing: Maximal assistance;Bed level   Lower Body Bathing: Total assistance;Bed level   Upper Body Dressing : Total assistance;Bed level   Lower Body Dressing: Total assistance;Bed level                       Vision Baseline Vision/History: 1 Wears glasses (distance) Ability to See in Adequate Light: 0 Adequate Patient Visual Report: No change from baseline              Pertinent Vitals/Pain Pain Assessment Pain Assessment: No/denies pain Faces Pain Scale: No hurt     Hand Dominance Right   Extremity/Trunk Assessment Upper Extremity Assessment Upper Extremity Assessment: Generalized weakness (3/5)           Communication Communication Communication: No difficulties   Cognition Arousal/Alertness: Lethargic (woke up more as well talked and I had him move) Behavior During Therapy: Flat affect Overall Cognitive Status: Impaired/Different from baseline Area of Impairment: Attention, Following commands, Problem solving                   Current Attention Level: Selective   Following Commands: Follows one step  commands with increased time     Problem Solving: Slow processing, Requires verbal cues General Comments: pt initally slow processing, got better as session went on. Could move both arms at elbow and shoulder with only minimal issues with strength, but then when asked to touch his  nose and was having issues doing this he needed cues to raise shoulder so he could touch nose with finger                Home Living Family/patient expects to be discharged to:: Private residence Living Arrangements: Spouse/significant other Available Help at Discharge: Family;Friend(s);Available PRN/intermittently Type of Home: House Home Access: Stairs to enter [employer] of Steps: 2   Home Layout: Two level;Bed/bath upstairs Alternate Level Stairs-Number of Steps: 13 Alternate Level Stairs-Rails: Right Bathroom Shower/Tub: Tub/shower unit;Curtain   Bathroom Toilet: Standard     Home Equipment: None   Additional Comments: Lives with cousin who works; has fiance who also works. Reports cousin may have some DME he could      Prior Functioning/Environment Prior Level of Function : Independent/Modified Independent;Driving             Mobility Comments: Typically independent without DME.          OT Problem List: Decreased strength;Decreased range of motion;Impaired balance (sitting and/or standing);Obesity;Impaired UE functional use;Decreased cognition      OT Treatment/Interventions: Self-care/ADL training;DME and/or AE instruction;Patient/family education;Balance training    OT Goals(Current goals can be found in the care plan section) Acute Rehab OT Goals Patient Stated Goal: to eat more when his stomach feels better OT Goal Formulation: With patient Time For Goal Achievement: 09/17/22 Potential to Achieve Goals: Good  OT Frequency: Min 2X/week       AM-PAC OT "6 Clicks" Daily Activity     Outcome Measure Help from another person eating meals?: A Lot Help from another person taking care of personal grooming?: A Lot Help from another person toileting, which includes using toliet, bedpan, or urinal?: Total Help from another person bathing (including washing, rinsing, drying)?: A Lot Help from another person to put on and taking off regular upper  body clothing?: Total Help from another person to put on and taking off regular lower body clothing?: Total 6 Click Score: 9   End of Session Equipment Utilized During Treatment: Oxygen (HHFNC 35% FiO2 60)  Activity Tolerance: Patient limited by lethargy Patient left: in bed  OT Visit Diagnosis: Muscle weakness (generalized) (M62.81);Other symptoms and signs involving cognitive function                Time: 9480-1655 OT Time Calculation (min): 12 min Charges:  OT General Charges $OT Visit: 1 Visit OT Evaluation $OT Eval Moderate Complexity: [place], OTR/L Acute [employer] [facility] [phone] Office [phone]    Almon Register 09/03/2022, 1:48 PM

## 2022-09-03 NOTE — Progress Notes (Signed)
Physical Therapy Treatment Patient Details Name: Victor Thompson MRN: [id number] DOB: 01/13/1961 Today's Date: 09/03/2022   History of Present Illness Pt is a 61 y.o. male admitted from HD on 08/24/22 with c/o palpitations, feeling faint. In ED, recurrent episodes of VT with repeated ICD discharges. ETT 9/19-9/22. R/LHC on 9/20. CRRT 9/20-9/22, restarted 9/26. 9/24 VT and reintubated. extubated 9/28. PMH includes CHF, AICD, CHF, CAD, ESRD (on HD), HTN, MI, HLD, OSA (uses CPAP), obesity, asthma.    PT Comments    Pt very pleasant and willing to mobilize with increased activity tolerance for standing and transfers on CRRt this session. Pt on 70% FiO2 HHFNC at 40L with NRB 25L for transition to chair due to line management. Pt with HR 69-97 and maintained BP with drop in SpO2 with attempted short stepping trial in standing with recovery in sitting. Will continue to follow to progress function.   Supine 154/62 Post activity 176/87   Recommendations for follow up therapy are one component of a multi-disciplinary discharge planning process, led by the attending physician.  Recommendations may be updated based on patient status, additional functional criteria and insurance authorization.  Follow Up Recommendations  Acute inpatient rehab (3hours/day)     Assistance Recommended at Discharge Frequent or constant Supervision/Assistance  Patient can return home with the following A lot of help with walking and/or transfers;A lot of help with bathing/dressing/bathroom;Assistance with cooking/housework;Direct supervision/assist for financial management;Help with stairs or ramp for entrance   Equipment Recommendations  Rolling walker (2 wheels)    Recommendations for Other Services       Precautions / Restrictions Precautions Precautions: Fall;Other (comment) Precaution Comments: Watch SpO2, HHFNC, CRRT Restrictions Weight Bearing Restrictions: No     Mobility  Bed Mobility   Bed Mobility:  Supine to Sit           General bed mobility comments: utilized foot egress to transition to full chair position with CRRT and HHFNC on opposite sides of the bed. Pt able to utlize rails to pull trunk forward with mod assist to scoot to EOB    Transfers Overall transfer level: Needs assistance   Transfers: Sit to/from Stand Sit to Stand: Min assist, +2 physical assistance           General transfer comment: min +2 assist to rise from end of bed and chair with cues for hand placement. Pt able to stand pivot from bed to recliner then perform 2 steps forward and back from recliner with RW min +2    Ambulation/Gait               General Gait Details: not yet ready to attempt   Stairs             Wheelchair Mobility    Modified Rankin (Stroke Patients Only)       Balance Overall balance assessment: Needs assistance   Sitting balance-Leahy Scale: Fair Sitting balance - Comments: sitting without UE support   Standing balance support: Bilateral upper extremity supported Standing balance-Leahy Scale: Poor Standing balance comment: bil UE support on rails or RW                            Cognition Arousal/Alertness: Awake/alert Behavior During Therapy: WFL for tasks assessed/performed Overall Cognitive Status: Impaired/Different from baseline Area of Impairment: Attention                   Current Attention Level: Selective  Following Commands: Follows one step commands consistently Safety/Judgement: Decreased awareness of deficits   Problem Solving: Slow processing          Exercises      General Comments        Pertinent Vitals/Pain Pain Assessment Pain Assessment: No/denies pain    Home Living                          Prior Function            PT Goals (current goals can now be found in the care plan section) Progress towards PT goals: Progressing toward goals    Frequency    Min 3X/week       PT Plan Current plan remains appropriate    Co-evaluation              AM-PAC PT "6 Clicks" Mobility   Outcome Measure  Help needed turning from your back to your side while in a flat bed without using bedrails?: A Lot Help needed moving from lying on your back to sitting on the side of a flat bed without using bedrails?: A Lot Help needed moving to and from a bed to a chair (including a wheelchair)?: A Lot Help needed standing up from a chair using your arms (e.g., wheelchair or bedside chair)?: A Lot Help needed to walk in hospital room?: A Lot Help needed climbing 3-5 steps with a railing? : Total 6 Click Score: 11    End of Session   Activity Tolerance: Patient tolerated treatment well Patient left: in chair;with call bell/phone within reach;with nursing/sitter in room Nurse Communication: Mobility status PT Visit Diagnosis: Other abnormalities of gait and mobility (R26.89);Muscle weakness (generalized) (M62.81)     Time: 2248-2500 PT Time Calculation (min) (ACUTE ONLY): 38 min  Charges:  $Therapeutic Activity: 38-52 mins                     [name], PT Acute Rehabilitation Services Office: [phone]    Shanty Ginty B Shyra Emile 09/03/2022, 10:03 AM

## 2022-09-03 NOTE — Evaluation (Signed)
Clinical/Bedside Swallow Evaluation Patient Details  Name: Victor Thompson MRN: [id number] Date of Birth: 08/18/1961  Today's Date: 09/03/2022 Time: SLP Start Time (ACUTE ONLY): 9211 SLP Stop Time (ACUTE ONLY): 0915 SLP Time Calculation (min) (ACUTE ONLY): 18 min  Past Medical History:  Past Medical History:  Diagnosis Date   Acute on chronic systolic CHF (congestive heart failure) (HCC) 06/14/2020   Acute respiratory failure ([place]) 05/28/2022   AICD (automatic cardioverter/defibrillator) present    2015   Allergy    Asthma    no meds   Cardiac arrest ([place]) 2015   Chest pain 05/25/2022   CHF (congestive heart failure) (HCC)    Coronary artery disease    ESRD on hemodialysis (HCC)    ckd -stage 5   Hyperlipidemia    Hypertension    Myocardial infarction ([place])    Obesity    Pneumonia    Shortness of breath dyspnea    Sleep apnea    USES CPAP   Wears glasses    Past Surgical History:  Past Surgical History:  Procedure Laterality Date   AV FISTULA PLACEMENT Right 03/15/2019   Procedure: RIGHT ARM ARTERIOVENOUS (AV) FISTULA CREATION;  Surgeon: Angelia Mould, MD;  Location: [facility];  Service: Vascular;  Laterality: Right;   COLONOSCOPY     COLONOSCOPY W/ BIOPSIES AND POLYPECTOMY     CORONARY STENT PLACEMENT  2007   IMPLANTABLE CARDIOVERTER DEFIBRILLATOR IMPLANT  2015   RIGHT/LEFT HEART CATH AND CORONARY ANGIOGRAPHY N/A 07/08/2021   Procedure: RIGHT/LEFT HEART CATH AND CORONARY ANGIOGRAPHY;  Surgeon: Larey Dresser, MD;  Location: [facility];  Service: Cardiovascular;  Laterality: N/A;   RIGHT/LEFT HEART CATH AND CORONARY ANGIOGRAPHY N/A 08/25/2022   Procedure: RIGHT/LEFT HEART CATH AND CORONARY ANGIOGRAPHY;  Surgeon: Larey Dresser, MD;  Location: [facility];  Service: Cardiovascular;  Laterality: N/A;   SUBQ ICD CHANGEOUT N/A 10/13/2020   Procedure: SUBQ ICD CHANGEOUT;  Surgeon: Evans Lance, MD;  Location: [facility];  Service: Cardiovascular;   Laterality: N/A;   HPI:  Pt is a 61 y.o. male admitted from HD on 08/24/22 with c/o palpitations, feeling faint. In ED, recurrent episodes of VT with repeated ICD discharges. ETT 9/19-9/22. R/LHC on 9/20. CRRT 9/20-9/22, restarted 9/26. 9/24 VT and reintubated. extubated 9/28. PMH includes CHF, AICD, CHF, CAD, ESRD (on HD), HTN, MI, HLD, OSA (uses CPAP), obesity, asthma.    Assessment / Plan / Recommendation  Clinical Impression  Pt is experiencing mild dysphonia s/p two intubations, but subjectively sounds improved after sips of water. He also reports sounding much better compared to previous date. Oral motor exam is otherwise unremarkable and he has no overt signs of aspiration or dysphagia during PO trials that include three ounce of water consumed via consecutive sips. Recommend starting wtih Dys 3 diet given mentation and overall deconditioning as well as concern for possible GI components per MD note. SLP will f/u at least briefly in light of intubations and acute deconditioning. SLP Visit Diagnosis: Dysphagia, unspecified (R13.10)    Aspiration Risk  Mild aspiration risk    Diet Recommendation Dysphagia 3 (Mech soft);Thin liquid   Liquid Administration via: Cup;Straw Medication Administration: Whole meds with liquid Supervision: Staff to assist with self feeding Compensations: Minimize environmental distractions;Slow rate;Small sips/bites Postural Changes: Seated upright at 90 degrees;Remain upright for at least 30 minutes after po intake    Other  Recommendations Oral Care Recommendations: Oral care BID    Recommendations for  follow up therapy are one component of a multi-disciplinary discharge planning process, led by the attending physician.  Recommendations may be updated based on patient status, additional functional criteria and insurance authorization.  Follow up Recommendations Acute inpatient rehab (3hours/day)      Assistance Recommended at Discharge Intermittent  Supervision/Assistance  Functional Status Assessment Patient has had a recent decline in their functional status and demonstrates the ability to make significant improvements in function in a reasonable and predictable amount of time.  Frequency and Duration min 2x/week  1 week       Prognosis Prognosis for Safe Diet Advancement: Good      Swallow Study   General HPI: Pt is a 61 y.o. male admitted from HD on 08/24/22 with c/o palpitations, feeling faint. In ED, recurrent episodes of VT with repeated ICD discharges. ETT 9/19-9/22. R/LHC on 9/20. CRRT 9/20-9/22, restarted 9/26. 9/24 VT and reintubated. extubated 9/28. PMH includes CHF, AICD, CHF, CAD, ESRD (on HD), HTN, MI, HLD, OSA (uses CPAP), obesity, asthma. Type of Study: Bedside Swallow Evaluation Previous Swallow Assessment: none in chart Diet Prior to this Study: NPO Temperature Spikes Noted: No Respiratory Status: Nasal cannula History of Recent Intubation: Yes Length of Intubations (days): 9 days (across 2 intubations) Date extubated: 09/02/22 Behavior/Cognition: Alert;Cooperative Oral Cavity Assessment: Within Functional Limits Oral Care Completed by SLP: No Oral Cavity - Dentition: Adequate natural dentition Vision: Functional for self-feeding Self-Feeding Abilities: Needs assist Patient Positioning: Upright in chair Baseline Vocal Quality: Hoarse (mild - pt describes as a "little scratchy") Volitional Cough: Weak Volitional Swallow: Able to elicit    Oral/Motor/Sensory Function Overall Oral Motor/Sensory Function: Within functional limits   Ice Chips Ice chips: Within functional limits Presentation: Spoon   Thin Liquid Thin Liquid: Within functional limits Presentation: Cup;Self Fed;Straw    Nectar Thick Nectar Thick Liquid: Not tested   Honey Thick Honey Thick Liquid: Not tested   Puree Puree: Within functional limits Presentation: Spoon   Solid     Solid: Within functional limits      Osie Bond., M.A.  [place] Office [phone]  Secure chat preferred  09/03/2022,9:22 AM

## 2022-09-03 NOTE — Progress Notes (Signed)
NAME:  Victor Thompson, MRN:  [id number], DOB:  [birth year], LOS: 26 ADMISSION DATE:  08/24/2022, CONSULTATION DATE:  09/03/22 REFERRING MD:  Aundra Dubin - HF, CHIEF COMPLAINT:  VT storm   History of Present Illness:  61 year old male with PMH Systolic HF (followed by Dr. Aundra Dubin, EF 25-30), H/O VT arrest, s/p ICD, on amiodarone. Presented from dialysis 9/19 with palpitations and feeling faint. In ED, recurrent episodes of VT with repeated [place] IVD discharges. Denied chest pain or dyspnea. Refractory despite shock x 3, (along with internal shocks), amiodarone, lopressor, and bolus of procainamide, (allergy to lidocaine with angioedema) Ultimately requiring intubation and paralytics.   Started on quinidine 9/20. Continued improvement. Extubated 9/22. Reintubated 9/24 in the setting of need for increased sedation and recurrent VT (monomorphic).   Pertinent Medical History:   Past Medical History:  Diagnosis Date   Acute on chronic systolic CHF (congestive heart failure) ([place]) 06/14/2020   Acute respiratory failure (HCC) 05/28/2022   AICD (automatic cardioverter/defibrillator) present    2015   Allergy    Asthma    no meds   Cardiac arrest [facility]) 2015   Chest pain 05/25/2022   CHF (congestive heart failure) (HCC)    Coronary artery disease    ESRD on hemodialysis (HCC)    ckd -stage 5   Hyperlipidemia    Hypertension    Myocardial infarction ([place])    Obesity    Pneumonia    Shortness of breath dyspnea    Sleep apnea    USES CPAP   Wears glasses     Significant Hospital Events: Including procedures, antibiotic start and stop dates in addition to other pertinent events   9/19 - Intubated, central line placed 9/20 - R/LHC performed, CVC placed into left internal jugular vein for CRRT 9/21 - No further VT  9/22 - Extubated, off pressors, off sedation, right radial arterial line removed, CRRT dc'd 9/24 - Reintubated in the setting of need for increased sedation and recurrent VT  (monomorphic). Rebolus with amiodarone. Procainamide started. IO placed. CVC placed later in the evening. Aline replaced. Brady/hypotensive overnight, amio decreased. NE/Vaso resumed. 9/25 - Remains on amio/procainamide. Brady in 47s. Off NE, on vaso 0.04. CVP 8-10. Keeping sedated per EP. 9/26 - HR 60s, remains off of NE, on vaso 0.04. CVP 14-15. Goal RASS -2 to -3 per EP. Versed changed to Propofol. Plan for CRRT vs. HD. 9/28 extubated to HFNC 9/29 Resp cultures with MSSA and Morganella. HFNC 50% and 40LPM  Interim History / Subjective:  Tolerating extubation. Requiring 50% HFNC. Increased to transfer to chair.  Remains off pressors.  WBC up today with L shift. Cultures showing MSSA and Morganella (R to amp, cefazolin)  Objective:  Blood pressure 130/68, pulse 88, temperature 98.2 F (36.8 C), temperature source Oral, resp. rate 18, height '5\' 7"'$  (1.702 m), weight 112.8 kg, SpO2 96 %. CVP:  [2 mmHg-34 mmHg] 10 mmHg  FiO2 (%):  [50 %-100 %] 50 %   Intake/Output Summary (Last 24 hours) at 09/03/2022 1102 Last data filed at 09/03/2022 0700 Gross per 24 hour  Intake 886.65 ml  Output 3081 ml  Net -2194.35 ml    Filed Weights   09/01/22 0500 09/02/22 0609 09/03/22 0656  Weight: 120 kg 116.6 kg 112.8 kg   Physical Examination: Middle aged male sitting comfortably in bedside chair Coarse bilateral breath sounds. HFNC 50% 40LPM no distress Tachy. Regular, no MRG Soft, NT, ND No significant edema Skin grossly intact Alert, oriented  Assessment & Plan:  Recurrent ventricular storm  Shock in setting of ventricular storm > resolved  S/p procainamide load 9/19. No sign of ischemic cause on R/LHC, likely due to volume overload with PWCP 26. Patient with recurrent VT (monomorphic), shocked by ICD x 5. - Advanced HF, EP teams following - Antiarrythmics per EP - Not a good candidate for ablation or advanced therapies.  - Replete electrolytes for goal K > 4, Mg > 2 - Monitor I&Os, goal  euvolemia  Acute respiratory failure with hypoxia requiring mechanical ventilation Resting on vent to reduce sympathetic stimulation in setting of unremitting Vtach storm.  Suspicion for aspiration from gastric immotility vs. HCAP 09/01/22. Almost certainly an element of pulmonary edema as well.  Tracheal aspirate growing MSSA and Morganella (R to amp, cefazolin) - Escalate to cefepime - Volume removal with CRRT - Heated high flow 50% and 40LPM weaned for O2 sat 06-2%  Chronic systolic CHF ESRD on HD Echo 08/25/2022 with EF 20-25%, global hypokinesis, Grade III diastolic dysfunction. - AHF/nephro teams following, appreciate management. - Push fluid removal  Constipation vs. Gastroparesis- KUB benign.  Promotility agents not ideal in setting of long QT and recurrent VT - Relistor and bowel regimen as ordered - Give suppository - Zofran PRN x 1. Hold for QT > 500  Morbid obesity - Nutrition/RD following - TF  Best Practice (right click and "Reselect all SmartList Selections" daily)   Diet/type: TF DVT prophylaxis: DOAC GI prophylaxis: PPI Lines: Central line and Dialysis Catheter Foley:  N/A Code Status:  full code Last date of multidisciplinary goals of care discussion [Per primary team]  41 minutes cc time  Georgann Housekeeper, AGACNP-BC [place] for personal pager PCCM on call pager [phone] until 7pm. Please call Elink 7p-7a. [phone]  09/03/2022 11:55 AM

## 2022-09-03 NOTE — Progress Notes (Signed)
Inpatient Rehab Coordinator Note:  I met with patient and daughter at bedside to discuss CIR recommendations and goals/expectations of CIR stay.  We reviewed 3 hrs/day of therapy, physician follow up, and average length of stay 2 weeks (dependent upon progress) with goals of mod I.  Patient is interested in CIR, however is asking about going to a facility in [place] as that is where is daughter lives and that may be where he goes to stay after CIR admission. I messaged TOC, as finding outside placement for him would be facilitated by TOC. Will continue to follow until Monday and check back with patient regarding this.   Rehab Admissons Coordinator [place], [place], [place] [phone]

## 2022-09-04 ENCOUNTER — Inpatient Hospital Stay (HOSPITAL_COMMUNITY): Payer: Medicare Other

## 2022-09-04 DIAGNOSIS — Z992 Dependence on renal dialysis: Secondary | ICD-10-CM | POA: Diagnosis not present

## 2022-09-04 DIAGNOSIS — I480 Paroxysmal atrial fibrillation: Secondary | ICD-10-CM

## 2022-09-04 DIAGNOSIS — R112 Nausea with vomiting, unspecified: Secondary | ICD-10-CM

## 2022-09-04 DIAGNOSIS — I129 Hypertensive chronic kidney disease with stage 1 through stage 4 chronic kidney disease, or unspecified chronic kidney disease: Secondary | ICD-10-CM | POA: Diagnosis not present

## 2022-09-04 DIAGNOSIS — N186 End stage renal disease: Secondary | ICD-10-CM | POA: Diagnosis not present

## 2022-09-04 LAB — POCT I-STAT 7, (LYTES, BLD GAS, ICA,H+H)
Acid-base deficit: 6 mmol/L — ABNORMAL HIGH (ref 0.0–2.0)
Acid-base deficit: 2 mmol/L (ref 0.0–2.0)
Bicarbonate: 19.1 mmol/L — ABNORMAL LOW (ref 20.0–28.0)
Bicarbonate: 23.7 mmol/L (ref 20.0–28.0)
Calcium, Ion: 1.06 mmol/L — ABNORMAL LOW (ref 1.15–1.40)
Calcium, Ion: 1.2 mmol/L (ref 1.15–1.40)
HCT: 26 % — ABNORMAL LOW (ref 39.0–52.0)
HCT: 33 % — ABNORMAL LOW (ref 39.0–52.0)
Hemoglobin: 8.8 g/dL — ABNORMAL LOW (ref 13.0–17.0)
Hemoglobin: 11.2 g/dL — ABNORMAL LOW (ref 13.0–17.0)
O2 Saturation: 94 %
O2 Saturation: 88 %
Patient temperature: 97.7
Patient temperature: 97.6
Potassium: 3.1 mmol/L — ABNORMAL LOW (ref 3.5–5.1)
Potassium: 4.1 mmol/L (ref 3.5–5.1)
Sodium: 141 mmol/L (ref 135–145)
Sodium: 136 mmol/L (ref 135–145)
TCO2: 20 mmol/L — ABNORMAL LOW (ref 22–32)
TCO2: 25 mmol/L (ref 22–32)
pCO2 arterial: 33.2 mmHg (ref 32–48)
pCO2 arterial: 40.4 mmHg (ref 32–48)
pH, Arterial: 7.366 (ref 7.35–7.45)
pH, Arterial: 7.373 (ref 7.35–7.45)
pO2, Arterial: 72 mmHg — ABNORMAL LOW (ref 83–108)
pO2, Arterial: 54 mmHg — ABNORMAL LOW (ref 83–108)

## 2022-09-04 LAB — CBC WITH DIFFERENTIAL/PLATELET
Abs Immature Granulocytes: 0.15 10*3/uL — ABNORMAL HIGH (ref 0.00–0.07)
Basophils Absolute: 0 10*3/uL (ref 0.0–0.1)
Basophils Relative: 0 %
Eosinophils Absolute: 0 10*3/uL (ref 0.0–0.5)
Eosinophils Relative: 0 %
HCT: 31.1 % — ABNORMAL LOW (ref 39.0–52.0)
Hemoglobin: 10.2 g/dL — ABNORMAL LOW (ref 13.0–17.0)
Immature Granulocytes: 1 %
Lymphocytes Relative: 3 %
Lymphs Abs: 0.4 10*3/uL — ABNORMAL LOW (ref 0.7–4.0)
MCH: 32.4 pg (ref 26.0–34.0)
MCHC: 32.8 g/dL (ref 30.0–36.0)
MCV: 98.7 fL (ref 80.0–100.0)
Monocytes Absolute: 1.4 10*3/uL — ABNORMAL HIGH (ref 0.1–1.0)
Monocytes Relative: 8 %
Neutro Abs: 14.5 10*3/uL — ABNORMAL HIGH (ref 1.7–7.7)
Neutrophils Relative %: 88 %
Platelets: 185 10*3/uL (ref 150–400)
RBC: 3.15 MIL/uL — ABNORMAL LOW (ref 4.22–5.81)
RDW: 16 % — ABNORMAL HIGH (ref 11.5–15.5)
WBC: 16.4 10*3/uL — ABNORMAL HIGH (ref 4.0–10.5)
nRBC: 0 % (ref 0.0–0.2)

## 2022-09-04 LAB — RENAL FUNCTION PANEL
Albumin: 2.9 g/dL — ABNORMAL LOW (ref 3.5–5.0)
Anion gap: 15 (ref 5–15)
BUN: 36 mg/dL — ABNORMAL HIGH (ref 6–20)
CO2: 24 mmol/L (ref 22–32)
Calcium: 9.3 mg/dL (ref 8.9–10.3)
Chloride: 99 mmol/L (ref 98–111)
Creatinine, Ser: 2.96 mg/dL — ABNORMAL HIGH (ref 0.61–1.24)
GFR, Estimated: 24 mL/min — ABNORMAL LOW (ref >60)
Glucose, Bld: 115 mg/dL — ABNORMAL HIGH (ref 70–99)
Phosphorus: 2.9 mg/dL (ref 2.5–4.6)
Potassium: 4 mmol/L (ref 3.5–5.1)
Sodium: 138 mmol/L (ref 135–145)

## 2022-09-04 LAB — APTT: aPTT: 58 seconds — ABNORMAL HIGH (ref 24–36)

## 2022-09-04 LAB — HEPATITIS B SURFACE ANTIBODY, QUANTITATIVE: Hep B S AB Quant (Post): >1000 m[IU]/mL (ref >9.9)

## 2022-09-04 LAB — GLUCOSE, CAPILLARY
Glucose-Capillary: 115 mg/dL — ABNORMAL HIGH (ref 70–99)
Glucose-Capillary: 115 mg/dL — ABNORMAL HIGH (ref 70–99)
Glucose-Capillary: 117 mg/dL — ABNORMAL HIGH (ref 70–99)
Glucose-Capillary: 118 mg/dL — ABNORMAL HIGH (ref 70–99)
Glucose-Capillary: 119 mg/dL — ABNORMAL HIGH (ref 70–99)
Glucose-Capillary: 123 mg/dL — ABNORMAL HIGH (ref 70–99)

## 2022-09-04 LAB — HEPARIN LEVEL (UNFRACTIONATED): Heparin Unfractionated: >1.1 IU/mL — ABNORMAL HIGH (ref 0.30–0.70)

## 2022-09-04 LAB — MAGNESIUM: Magnesium: 2.9 mg/dL — ABNORMAL HIGH (ref 1.7–2.4)

## 2022-09-04 MED ORDER — LORAZEPAM 2 MG/ML IJ SOLN
1.0000 mg | Freq: Once | INTRAMUSCULAR | Status: AC
  Administered 2022-09-04: 1 mg via INTRAVENOUS
  Filled 2022-09-04: qty 1

## 2022-09-04 MED ORDER — HEPARIN (PORCINE) 25000 UT/250ML-% IV SOLN
1100.0000 [IU]/h | INTRAVENOUS | Status: DC
  Administered 2022-09-04: 1000 [IU]/h via INTRAVENOUS
  Administered 2022-09-05: 1400 [IU]/h via INTRAVENOUS
  Administered 2022-09-07: 1100 [IU]/h via INTRAVENOUS
  Filled 2022-09-04 (×4): qty 250

## 2022-09-04 MED ORDER — PROCHLORPERAZINE EDISYLATE 10 MG/2ML IJ SOLN: 10.0000 mg | Freq: Four times a day (QID) | INTRAMUSCULAR | Status: DC | PRN

## 2022-09-04 MED ORDER — SCOPOLAMINE 1 MG/3DAYS TD PT72
1.0000 | MEDICATED_PATCH | TRANSDERMAL | Status: DC
  Administered 2022-09-04 – 2022-09-10 (×3): 1.5 mg via TRANSDERMAL
  Filled 2022-09-04 (×5): qty 1

## 2022-09-04 MED ORDER — METHYLPREDNISOLONE SODIUM SUCC 40 MG IJ SOLR
40.0000 mg | INTRAMUSCULAR | Status: DC
  Administered 2022-09-04 – 2022-09-08 (×5): 40 mg via INTRAVENOUS
  Filled 2022-09-04 (×5): qty 1

## 2022-09-04 MED ORDER — BISACODYL 10 MG RE SUPP
10.0000 mg | Freq: Once | RECTAL | Status: AC
  Administered 2022-09-04: 10 mg via RECTAL
  Filled 2022-09-04: qty 1

## 2022-09-04 MED ORDER — LORAZEPAM 2 MG/ML IJ SOLN
0.5000 mg | Freq: Four times a day (QID) | INTRAMUSCULAR | Status: DC | PRN
  Administered 2022-09-04 – 2022-09-05 (×4): 0.5 mg via INTRAVENOUS
  Filled 2022-09-04 (×4): qty 1

## 2022-09-04 NOTE — Progress Notes (Signed)
Pt removed form HHFNC and placed on 5L salter. Sats are 99%.

## 2022-09-04 NOTE — Progress Notes (Signed)
ANTICOAGULATION CONSULT NOTE   Pharmacy Consult for heparin Indication: atrial fibrillation  Allergies  Allergen Reactions   Ace Inhibitors Swelling    Swelling of the tongue   Influenza Vaccines Hives and Swelling    SWELLING REACTION UNSPECIFIED    Ketorolac Swelling    SWELLING REACTION UNSPECIFIED    Lidocaine Swelling    TONGUE SWELLS   Lisinopril Swelling    TONGUE SWELLING Pt reported problem with a BP med which sounded like  lisinopril  But as of 09/19/06,pt had tolerated altace without problem   Penicillins Swelling    TONGUE SWELLS Has patient had a PCN reaction causing immediate rash, facial/tongue/throat swelling, SOB or lightheadedness with hypotension: Yes Has patient had a PCN reaction causing severe rash involving mucus membranes or skin necrosis: No Has patient had a PCN reaction that required hospitalization No Has patient had a PCN reaction occurring within the last 10 years: Yes If all of the above answers are "NO", then may proceed with Cephalosporin use.     Patient Measurements: Height: '5\' 7"'$  (170.2 cm) Weight: 112.4 kg (247 lb 12.8 oz) IBW/kg (Calculated) : 66.1 HEPARIN DW (KG): 93.5   Vital Signs: Temp: 98.4 F (36.9 C) (09/30 1610) Temp Source: Oral (09/30 1610) BP: 130/75 (09/30 1900) Pulse Rate: 86 (09/30 1900)  Labs: Recent Labs    09/02/22 0342 09/02/22 1016 09/03/22 0429 09/03/22 1647 09/03/22 2328 09/04/22 0411 09/04/22 0520 09/04/22 1122 09/04/22 2036  HGB 9.1*  9.5*   < > 10.2*  --    < > 10.2* 8.8* 11.2*  --   HCT 27.7*  28.0*   < > 30.4*  --    < > 31.1* 26.0* 33.0*  --   PLT 149*  --  177  --   --  185  --   --   --   APTT  --   --   --   --   --   --   --   --  58*  HEPARINUNFRC 0.53  --   --   --   --   --   --   --  >1.10*  CREATININE 3.34*   < > 3.11* 3.09*  --  2.96*  --   --   --    < > = values in this interval not displayed.     Estimated Creatinine Clearance: 32.6 mL/min (A) (by C-G formula based on SCr  of 2.96 mg/dL (H)).   Assessment: 61 yo male here with recurrent VT and with history of afib on apixaban (last dose 9/30 at ~ 11pm). He is also noted on CRRT. He is having trouble keeping po medications down due to nausea. Pharmacy consulted to dose heparin -He was on heparin 1000 units/hr on 9/26 and heparin level was at goal.  Heparin level >1.1 (affected by apixaban), aPTT subtherapeutic (58 sec) on infusion at 1000 units/hr. No issues with line or bleeding reported per RN.  Goal of Therapy:  Heparin level 0.3-0.7 units/ml aPTT 66-102 seconds Monitor platelets by anticoagulation protocol: Yes   Plan:  Increase heparin to 1200 units/hr aPTT in 8 hours  Sherlon Handing, PharmD, BCPS Please see amion for complete clinical pharmacist phone list 09/04/2022 9:03 PM

## 2022-09-04 NOTE — Progress Notes (Signed)
Pt has home CPAP machine and currently on 5L salter. Pt says he is comfortable on his salter and would call me if he needed help with his home machine if he decided to wear it.

## 2022-09-04 NOTE — Progress Notes (Signed)
Patient ID: Victor Thompson, male   DOB: 1961-05-22, 61 y.o.   MRN: [id number] P    Advanced Heart Failure Rounding Note  PCP-Cardiologist: Loralie Champagne, MD   Subjective:    Admitted with VT storm.   9/24 multiple repeated shocks for VT monomorphic, reintubated, rebolused with amio, quinidine now on hold, started on procainamide.   9/25 Given concern for possible cardiac sarcoid, started empirically on prednisone. IV procainamide discontinued by EP  9/27 started on ceftriaxone for suspected aspiration PNA. Mexiletine started for VT suppression   9/28 Extubated  Extubated, remains on HFNC 10L.  He is on cefepime for aspiration PNA.  WBCs 17=>16, afebrile. Trach aspirate with MSSA and Morganella.   CVVH still running net negative 50 cc/hr.  CVP 3.   He remains on amiodarone gtt at 30 + mexiletine.  He is in rate-controlled AF this morning.   Nausea over the last day with emesis.  Feels constipated.    RHC/LHC (9/20):  Coronary Findings  Diagnostic Dominance: Right Left Anterior Descending  Ost LAD to Prox LAD lesion is 40% stenosed.    First Diagonal Branch  1st Diag lesion is 60% stenosed.    Left Circumflex  Mid Cx lesion is 30% stenosed.    Right Coronary Artery  Prox RCA lesion is 30% stenosed.    Intervention   No interventions have been documented.   Right Heart  Right Heart Pressures RHC Procedural Findings: Hemodynamics (mmHg) RA mean 14 RV 52/15 PA 53/27, mean 37 PCWP mean 26 LV 97/29 AO 103/64  Oxygen saturations: PA 76% AO 100%  Cardiac Output (Fick) 8.98  Cardiac Index (Fick) 3.96 PVR 2.8 WU    Objective:   Weight Range: 112.4 kg Body mass index is 38.81 kg/m.   Vital Signs:   Temp:  [97.6 F (36.4 C)-98 F (36.7 C)] 97.7 F (36.5 C) (09/30 0400) Pulse Rate:  [60-103] 70 (09/30 0535) Resp:  [12-24] 15 (09/30 0535) BP: (83-148)/(55-105) 133/75 (09/30 0535) SpO2:  [95 %-100 %] 98 % (09/30 0535) Arterial Line BP: (99-206)/(45-88)  159/72 (09/30 0535) FiO2 (%):  [30 %-60 %] 30 % (09/30 0428) Weight:  [112.4 kg] 112.4 kg (09/30 0625) Last BM Date :  (Pta)  Weight change: Filed Weights   09/02/22 0609 09/03/22 0656 09/04/22 0625  Weight: 116.6 kg 112.8 kg 112.4 kg    Intake/Output:   Intake/Output Summary (Last 24 hours) at 09/04/2022 0721 Last data filed at 09/04/2022 0700 Gross per 24 hour  Intake 739.18 ml  Output 2447 ml  Net -1707.82 ml      Physical Exam   CVP 3 General: NAD Neck: No JVD, no thyromegaly or thyroid nodule.  Lungs: Clear to auscultation bilaterally with normal respiratory effort. CV: Nondisplaced PMI.  Heart irregular S1/S2, no S3/S4, no murmur.  No peripheral edema.   Abdomen: Soft, nontender, no hepatosplenomegaly, no distention.  Skin: Intact without lesions or rashes.  Neurologic: Alert and oriented x 3.  Psych: Normal affect. Extremities: No clubbing or cyanosis.  HEENT: Normal.    Telemetry   Atrial fibrillation 70s, has been alternating AF and NSR. No VT (personally reviewed)   EKG    N/A  Labs    CBC Recent Labs    09/03/22 0429 09/03/22 2328 09/04/22 0411 09/04/22 0520  WBC 17.3*  --  16.4*  --   NEUTROABS 16.4*  --  14.5*  --   HGB 10.2*   < > 10.2* 8.8*  HCT 30.4*   < >  31.1* 26.0*  MCV 98.4  --  98.7  --   PLT 177  --  185  --    < > = values in this interval not displayed.   Basic Metabolic Panel Recent Labs    09/03/22 0429 09/03/22 1647 09/03/22 2328 09/04/22 0411 09/04/22 0520  NA 137 138   < > 138 141  K 3.9 3.9   < > 4.0 3.1*  CL 102 102  --  99  --   CO2 26 23  --  24  --   GLUCOSE 117* 128*  --  115*  --   BUN 32* 38*  --  36*  --   CREATININE 3.11* 3.09*  --  2.96*  --   CALCIUM 8.6* 8.5*  --  9.3  --   MG 2.8*  --   --  2.9*  --   PHOS 2.9 2.8  --  2.9  --    < > = values in this interval not displayed.   Liver Function Tests Recent Labs    09/03/22 1647 09/04/22 0411  ALBUMIN 2.9* 2.9*   No results for input(s):  "LIPASE", "AMYLASE" in the last 72 hours. Cardiac Enzymes No results for input(s): "CKTOTAL", "CKMB", "CKMBINDEX", "TROPONINI" in the last 72 hours.  BNP: BNP (last 3 results) Recent Labs    05/25/22 0705 08/16/22 0627 08/16/22 1257  BNP 1,355.4* QUANTITY NOT SUFFICIENT, UNABLE TO PERFORM TEST 399.2*    ProBNP (last 3 results) No results for input(s): "PROBNP" in the last 8760 hours.   D-Dimer No results for input(s): "DDIMER" in the last 72 hours. Hemoglobin A1C No results for input(s): "HGBA1C" in the last 72 hours. Fasting Lipid Panel No results for input(s): "CHOL", "HDL", "LDLCALC", "TRIG", "CHOLHDL", "LDLDIRECT" in the last 72 hours.  Thyroid Function Tests No results for input(s): "TSH", "T4TOTAL", "T3FREE", "THYROIDAB" in the last 72 hours.  Invalid input(s): "FREET3"   Other results:   Imaging    DG CHEST PORT 1 VIEW  Result Date: 09/03/2022 CLINICAL DATA:  Ventricular tachycardia, respiratory failure, CHF and renal failure. EXAM: PORTABLE CHEST 1 VIEW COMPARISON:  09/01/2022 FINDINGS: The patient has been extubated since the prior chest x-ray. Gastric decompression tube has also been removed. Non tunneled left jugular dialysis catheter and separate right jugular central venous catheter are in stable position. Subcutaneous ICD in stable position. Stable global cardiomegaly. Increase in pulmonary vascular congestion likely reflects congestive heart failure. No significant pleural fluid identified by portable chest x-ray. No pneumothorax. IMPRESSION: Post extubation.  Likely increase in congestive heart failure. Electronically Signed   By: Aletta Edouard M.D.   On: 09/03/2022 09:23     Medications:     Scheduled Medications:  amiodarone  150 mg Intravenous Once   apixaban  5 mg Oral BID   atorvastatin  80 mg Oral QHS   bisacodyl  10 mg Rectal Once   carvedilol  6.25 mg Oral BID WC   Chlorhexidine Gluconate Cloth  6 each Topical Q0600   Chlorhexidine  Gluconate Cloth  6 each Topical Q0600   cholecalciferol  5,000 Units Oral Daily   fentaNYL (SUBLIMAZE) injection  50 mcg Intravenous Once   melatonin  3 mg Oral QHS   mexiletine  200 mg Oral BID   multivitamin  1 tablet Oral QHS   pantoprazole  40 mg Oral Daily   polyethylene glycol  17 g Oral Daily   predniSONE  40 mg Oral Q breakfast   sennosides  10 mL Oral BID   sodium chloride flush  10-40 mL Intracatheter Q12H    Infusions:   prismasol BGK 4/2.5 500 mL/hr at 09/04/22 0625    prismasol BGK 4/2.5 400 mL/hr at 09/03/22 1108   sodium chloride Stopped (08/26/22 1922)   amiodarone 30 mg/hr (09/04/22 0700)   anticoagulant sodium citrate     ceFEPime (MAXIPIME) IV Stopped (09/03/22 2347)   norepinephrine (LEVOPHED) Adult infusion Stopped (09/02/22 0544)   prismasol BGK 4/2.5 1,500 mL/hr at 09/04/22 0202   vasopressin Stopped (09/02/22 1720)    PRN Medications: acetaminophen, albuterol, alteplase, anticoagulant sodium citrate, fentaNYL, heparin, LORazepam, mouth rinse, sodium chloride flush    Patient Profile   Victor Thompson is 61 year old with a history of VT, ESRD, CAD, ischemic CMP, and ESRD. He had PCI in 3086, uncertain what vessel was involved. Echo 2/22: EF 25-30% with moderate LV dilation and normal RV.  He had VT arrest in 2015, has [employer] subcutaneous ICD.  [name] in 2/22.  LHC/RHC was done in 8/22, showing no significant CAD and stable hemodynamics.   Admitted with refractory VT complicated by shock.    Assessment/Plan   1. VT storm: Has has a Animator ICD.  Refractory monomorphic VT. Multiple shocks.  Later alternating NSR with a slow wide complex AIVR-like rhythm. Recurrent monomorphic VT 9/24 w/ repeated shocks>>re-intubated, rebolused with amio, quinidine discontinued, started on procainamide, now off.  Given concern for possible cardiac sarcoid, started empirically on prednisone, currently on 40 mg daily. Cath this admit with nonobstructive CAD. Echo EF  20-25%, GIIIDD (restrictive), RV normal. No further VT, remains on amiodarone gtt 30 mg/hr + mexiletine.  - EP has decided against VT ablation, do not think it would be likely to be successful and high risk.  - Continue amiodarone gtt 30 mg/hr today, would not switch to po yet with nausea/vomiting.  - Mexiletine 200 mg bid added for suppression, continue. Tolerating ok, no signs of angioedema and he has been able to keep this down when given.   - Coreg 6.25 mg bid as long as BP stable.  - Long term very concerning, not candidate for LVAD with ESRD.   - Continue empiric prednisone 40 mg daily  - Would like eventual cardiac PET for sarcoidosis but can't do at this center yet.  Will need to be at [facility] after discharge.  - Would get CT chest when more stable/PNA treated to look for evidence of pulmonary sarcoidosis.  2. Acute on chronic systolic CHF with cardiogenic shock: In setting of incessant VT.  H/o NICM, echo this admission with EF 20-25%, normal RV function, mild-moderate Victor, IVC dilated.  At last admission, unable to complete cardiac MRI due to artifact from [facility] ICD.  PYP scan was negative.  RHC this admit showed volume overload>>CRRT for volume removal.  Currently on CVVH, CVP 3. UF 50 cc/hr net.  Off pressors.  Weight down.  - With CVP 3, can run CVVH even and ready to transition back to iHD.  3. CAD: ?PCI in 2005, unsure what vessel.  Coronary angiography in 8/22 showed no significant CAD and also does not appear to show prior stent so h/o CAD is questionable. LHC this admission with nonobstructive CAD.  - Continue statin. - No ASA with anticoagulation.  4. ESRD: Since 2/22. TTS HD.  - Ongoing CVVH, would run even until turned off.  - Seems ready for iHD transition.   5. ID: Suspect aspiration PNA.  Growing MSSA and Morganella from sputum.  Afebrile with WBCs 16.  - Continue cefepime.   6. Atrial fibrillation: Paroxysmal, noted this admission. Has also had AFL.  In AF this morning.   -  Continue amiodarone  - Continue Eliquis  5 mg bid  7. Nausea/vomiting: Started yesterday, cause uncertain.  Not clearly associated with a medication.  - KUB - Bowel regimen - symptomatic treatment.   CRITICAL CARE Performed by: Loralie Champagne  Total critical care time: 35 minutes  Critical care time was exclusive of separately billable procedures and treating other patients.  Critical care was necessary to treat or prevent imminent or life-threatening deterioration.  Critical care was time spent personally by me on the following activities: development of treatment plan with patient and/or surrogate as well as nursing, discussions with consultants, evaluation of patient's response to treatment, examination of patient, obtaining history from patient or surrogate, ordering and performing treatments and interventions, ordering and review of laboratory studies, ordering and review of radiographic studies, pulse oximetry and re-evaluation of patient's condition.  Loralie Champagne 09/04/2022 7:21 AM

## 2022-09-04 NOTE — Progress Notes (Signed)
Pharmacy Antibiotic Note  Victor Thompson is a 61 y.o. male with MSSA and morganella PNA. Pharmacy has been consulted for cefepime dosing (started 9/29). He is on CRRT and plans to transition to HD.  -On Cefepime day 2. WBC= 16.4 on steroids -plans for HD 10/1  Plan: -Continue cefepime 1gm IV q24h -Will follow HD plans  Height: '5\' 7"'$  (170.2 cm) Weight: 112.4 kg (247 lb 12.8 oz) IBW/kg (Calculated) : 66.1  Temp (24hrs), Avg:97.8 F (36.6 C), Min:97.6 F (36.4 C), Max:98 F (36.7 C)  Recent Labs  Lab 08/31/22 0450 08/31/22 1509 09/01/22 0354 09/01/22 1550 09/02/22 0342 09/02/22 1625 09/03/22 0429 09/03/22 1647 09/04/22 0411  WBC 11.7*  --  12.7*  --  8.4  --  17.3*  --  16.4*  CREATININE 10.52*   < > 6.22*   < > 3.34* 3.09* 3.11* 3.09* 2.96*   < > = values in this interval not displayed.    Estimated Creatinine Clearance: 32.6 mL/min (A) (by C-G formula based on SCr of 2.96 mg/dL (H)).    Allergies  Allergen Reactions   Ace Inhibitors Swelling    Swelling of the tongue   Influenza Vaccines Hives and Swelling    SWELLING REACTION UNSPECIFIED    Ketorolac Swelling    SWELLING REACTION UNSPECIFIED    Lidocaine Swelling    TONGUE SWELLS   Lisinopril Swelling    TONGUE SWELLING Pt reported problem with a BP med which sounded like  lisinopril  But as of 09/19/06,pt had tolerated altace without problem   Penicillins Swelling    TONGUE SWELLS Has patient had a PCN reaction causing immediate rash, facial/tongue/throat swelling, SOB or lightheadedness with hypotension: Yes Has patient had a PCN reaction causing severe rash involving mucus membranes or skin necrosis: No Has patient had a PCN reaction that required hospitalization No Has patient had a PCN reaction occurring within the last 10 years: Yes If all of the above answers are "NO", then may proceed with Cephalosporin use.     Antimicrobials this admission: -CTX 9/27>9/29 -Cefepime 9/29 >>  Dose adjustments  this admission:   Microbiology results: 9/27 Resp Cx: Morganella (R to amp, ancef, intermed Unasyn) and MSSA  Thank you for allowing pharmacy to be a part of this patient's care.  Hildred Laser, PharmD Clinical Pharmacist **Pharmacist phone directory can now be found on [email or web address] ([email or web address]).  Listed under [place].

## 2022-09-04 NOTE — Progress Notes (Signed)
Electrophysiology Rounding Note  [name] Date of Encounter: 09/04/2022  Primary Cardiologist: Loralie Champagne, MD Electrophysiologist: Cristopher Peru, MD   Subjective   He reports that he feels well. Has been throwing up and is now NPO.   Inpatient Medications    Scheduled Meds:  amiodarone  150 mg Intravenous Once   apixaban  5 mg Oral BID   atorvastatin  80 mg Oral QHS   bisacodyl  10 mg Rectal Once   carvedilol  6.25 mg Oral BID WC   Chlorhexidine Gluconate Cloth  6 each Topical Q0600   Chlorhexidine Gluconate Cloth  6 each Topical Q0600   cholecalciferol  5,000 Units Oral Daily   fentaNYL (SUBLIMAZE) injection  50 mcg Intravenous Once   melatonin  3 mg Oral QHS   mexiletine  200 mg Oral BID   multivitamin  1 tablet Oral QHS   pantoprazole  40 mg Oral Daily   polyethylene glycol  17 g Oral Daily   predniSONE  40 mg Oral Q breakfast   sennosides  10 mL Oral BID   sodium chloride flush  10-40 mL Intracatheter Q12H   Continuous Infusions:   prismasol BGK 4/2.5 500 mL/hr at 09/04/22 0625    prismasol BGK 4/2.5 400 mL/hr at 09/03/22 1108   sodium chloride Stopped (08/26/22 1922)   amiodarone 30 mg/hr (09/04/22 0700)   anticoagulant sodium citrate     ceFEPime (MAXIPIME) IV Stopped (09/03/22 2347)   norepinephrine (LEVOPHED) Adult infusion Stopped (09/02/22 0544)   prismasol BGK 4/2.5 1,500 mL/hr at 09/04/22 0202   vasopressin Stopped (09/02/22 1720)   PRN Meds: acetaminophen, albuterol, alteplase, anticoagulant sodium citrate, fentaNYL, heparin, LORazepam, mouth rinse, sodium chloride flush   Vital Signs    Vitals:   09/04/22 0535 09/04/22 0625 09/04/22 0810 09/04/22 0828  BP: 133/75   102/79  Pulse: 70   80  Resp: 15   16  Temp:   97.6 F (36.4 C)   TempSrc:   Oral   SpO2: 98%   98%  Weight:  112.4 kg    Height:        Intake/Output Summary (Last 24 hours) at 09/04/2022 0851 Last data filed at 09/04/2022 0700 Gross per 24 hour   Intake 709.58 ml  Output 2312 ml  Net -1602.42 ml   Filed Weights   09/02/22 0609 09/03/22 0656 09/04/22 0625  Weight: 116.6 kg 112.8 kg 112.4 kg    Physical Exam    GEN- Sitting in chair, Morbidly obese  HEENT- Alvan in place, benign otherwise Lungs- CTA Heart- RRR, no murmurs, rubs or gallops GI- soft Extremities- no clubbing or cyanosis. no edema Skin- no rash or lesion Neuro- answers questions appropriately, falls asleep quickly during lull in conversation  Labs    CBC Recent Labs    09/03/22 0429 09/03/22 2328 09/04/22 0411 09/04/22 0520  WBC 17.3*  --  16.4*  --   NEUTROABS 16.4*  --  14.5*  --   HGB 10.2*   < > 10.2* 8.8*  HCT 30.4*   < > 31.1* 26.0*  MCV 98.4  --  98.7  --   PLT 177  --  185  --    < > = values in this interval not displayed.   Basic Metabolic Panel Recent Labs    09/03/22 0429 09/03/22 1647 09/03/22 2328 09/04/22 0411 09/04/22 0520  NA 137 138   < > 138 141  K 3.9 3.9   < >  4.0 3.1*  CL 102 102  --  99  --   CO2 26 23  --  24  --   GLUCOSE 117* 128*  --  115*  --   BUN 32* 38*  --  36*  --   CREATININE 3.11* 3.09*  --  2.96*  --   CALCIUM 8.6* 8.5*  --  9.3  --   MG 2.8*  --   --  2.9*  --   PHOS 2.9 2.8  --  2.9  --    < > = values in this interval not displayed.   Liver Function Tests Recent Labs    09/03/22 1647 09/04/22 0411  ALBUMIN 2.9* 2.9*   No results for input(s): "LIPASE", "AMYLASE" in the last 72 hours. Cardiac Enzymes No results for input(s): "CKTOTAL", "CKMB", "CKMBINDEX", "TROPONINI" in the last 72 hours.   Telemetry    Yesterday ~1400, Afib/flutter with ?SVT, baseline rhythm is a wide-complex QRS Now SR with rates in 80-100s, frequent PVCs  Radiology     08/25/22: [facility] Left Anterior Descending  Ost LAD to Prox LAD lesion is 40% stenosed.    First Diagonal Branch  1st Diag lesion is 60% stenosed.    Left Circumflex  Mid Cx lesion is 30% stenosed.    Right Coronary Artery  Prox RCA lesion is  30% stenosed.     Right Heart Pressures RHC Procedural Findings: Hemodynamics (mmHg) RA mean 14 RV 52/15 PA 53/27, mean 37 PCWP mean 26 LV 97/29 AO 103/64  Oxygen saturations: PA 76% AO 100%  Cardiac Output (Fick) 8.98  Cardiac Index (Fick) 3.96 PVR 2.8 WU    08/25/2022: TTE  1. Left ventricular ejection fraction, by estimation, is 20 to 25%. The  left ventricle has severely decreased function. The left ventricle  demonstrates global hypokinesis. The left ventricular internal cavity size  was severely dilated. Left ventricular  diastolic parameters are consistent with Grade III diastolic dysfunction  (restrictive). Elevated left ventricular end-diastolic pressure.   2. Right ventricular systolic function is normal. The right ventricular  size is mildly enlarged. There is mildly elevated pulmonary artery  systolic pressure.   3. Left atrial size was moderately dilated.   4. Right atrial size was mildly dilated.   5. The mitral valve is normal in structure. Mild to moderate mitral valve  regurgitation. No evidence of mitral stenosis.   6. The aortic valve is grossly normal. Aortic valve regurgitation is not  visualized. No aortic stenosis is present.   7. The inferior vena cava is dilated in size with <50% respiratory  variability, suggesting right atrial pressure of 15 mmHg.   8. Cannot exclude a small PFO.   Comparison(s): Changes from prior study are noted. LV chamber now severely  dilated (7 cm).    10/21/21: TTE  1. Left ventricular ejection fraction, by estimation, is 25 to 30%. The  left ventricle has severely decreased function. The left ventricle  demonstrates global hypokinesis. The left ventricular internal cavity size  was moderately dilated. Left  ventricular diastolic parameters are consistent with Grade I diastolic  dysfunction (impaired relaxation).   2. Right ventricular systolic function is normal. The right ventricular  size is normal. There is  normal pulmonary artery systolic pressure. The  estimated right ventricular systolic pressure is 10.2 mmHg.   3. The mitral valve is normal in structure. Mild mitral valve  regurgitation. No evidence of mitral stenosis.   4. The aortic valve is tricuspid. Aortic valve regurgitation  is not  visualized. No aortic stenosis is present.   5. The inferior vena cava is normal in size with greater than 50%  respiratory variability, suggesting right atrial pressure of 3 mmHg.    07/08/21; LHC 1. Normal filling pressures and normal cardiac output.  2. No significant CAD.  Nonischemic cardiomyopathy.   Patient Profile     Victor Thompson is a 61 y.o. male with a hx of  hx of VF arrest (2015), CAD (PCI in 2005, unknown details), ICM,. Chronic CHF (systolic), ESRF on HD , OSA w/CPAP, HTN, HLD, recurrent angio edema who was admitted for another VT storm.    Assessment & Plan    1.  VT storm / refractory VT Was shocked 9/24 by both ICD and external defib. Doing well -- minimal, brief NSVT but having frequent atrial and ventricular ectopy.  - Amio gtt decreased back to 30, but a bolus was given last night due to AF - tolerating coreg 6.25 though SBP 102 this AM may preclude escalation. Consider switching to metoprolol XL  - tolerating mexilitine without angioedema - Failed quinidine   2. Chronic systolic CHF AHF team is on board Coronary angiography in 07/2021 showed no significant CAD, and also did not appear to show prior "stent" which had been reported.  R/LHC 08/25/2022 with Moderate non obstructive CAD and elevated filling pressures as above.  PYP scan negative  Had been planned for PET as outpatient for possible Sarcoid.  ECG with IVCD 150 msec, thought not to be good candidate for CRT upgrade (not true LBBB) Currently off pressors  Started on steroids if perhaps/possible sarcoid playing a role  3. ESRD Nephrology following.  Re-initiated CRRT   4. Acute hypoxemic respiratory  failure Extubated 9/28 Appreciate HF and CCM care.   5. AFib/flutter  Most tele events appear to be atrial extopy and flutter (eg 9/30 06:42:44 "Pacer not pacing" event shows flutter.   For questions or updates, please contact [place] Please consult [email or web address] for contact info under Cardiology/STEMI.  Signed, Melida Quitter, MD  09/04/2022, 8:51 AM

## 2022-09-04 NOTE — Progress Notes (Signed)
ANTICOAGULATION CONSULT NOTE   Pharmacy Consult for heparin Indication: atrial fibrillation  Allergies  Allergen Reactions   Ace Inhibitors Swelling    Swelling of the tongue   Influenza Vaccines Hives and Swelling    SWELLING REACTION UNSPECIFIED    Ketorolac Swelling    SWELLING REACTION UNSPECIFIED    Lidocaine Swelling    TONGUE SWELLS   Lisinopril Swelling    TONGUE SWELLING Pt reported problem with a BP med which sounded like  lisinopril  But as of 09/19/06,pt had tolerated altace without problem   Penicillins Swelling    TONGUE SWELLS Has patient had a PCN reaction causing immediate rash, facial/tongue/throat swelling, SOB or lightheadedness with hypotension: Yes Has patient had a PCN reaction causing severe rash involving mucus membranes or skin necrosis: No Has patient had a PCN reaction that required hospitalization No Has patient had a PCN reaction occurring within the last 10 years: Yes If all of the above answers are "NO", then may proceed with Cephalosporin use.     Patient Measurements: Height: '5\' 7"'$  (170.2 cm) Weight: 112.4 kg (247 lb 12.8 oz) IBW/kg (Calculated) : 66.1 HEPARIN DW (KG): 93.5   Vital Signs: Temp: 97.6 F (36.4 C) (09/30 0810) Temp Source: Oral (09/30 0810) BP: 110/67 (09/30 1000) Pulse Rate: 72 (09/30 1000)  Labs: Recent Labs    09/02/22 0342 09/02/22 1016 09/03/22 0429 09/03/22 1647 09/03/22 2328 09/04/22 0411 09/04/22 0520  HGB 9.1*  9.5*   < > 10.2*  --  9.5* 10.2* 8.8*  HCT 27.7*  28.0*   < > 30.4*  --  28.0* 31.1* 26.0*  PLT 149*  --  177  --   --  185  --   HEPARINUNFRC 0.53  --   --   --   --   --   --   CREATININE 3.34*   < > 3.11* 3.09*  --  2.96*  --    < > = values in this interval not displayed.    Estimated Creatinine Clearance: 32.6 mL/min (A) (by C-G formula based on SCr of 2.96 mg/dL (H)).   Medical History: Past Medical History:  Diagnosis Date   Acute on chronic systolic CHF (congestive heart  failure) ([place]) 06/14/2020   Acute respiratory failure (HCC) 05/28/2022   AICD (automatic cardioverter/defibrillator) present    2015   Allergy    Asthma    no meds   Cardiac arrest [facility]) 2015   Chest pain 05/25/2022   CHF (congestive heart failure) (HCC)    Coronary artery disease    ESRD on hemodialysis (HCC)    ckd -stage 5   Hyperlipidemia    Hypertension    Myocardial infarction (HCC)    Obesity    Pneumonia    Shortness of breath dyspnea    Sleep apnea    USES CPAP   Wears glasses     Medications:  Scheduled:   amiodarone  150 mg Intravenous Once   atorvastatin  80 mg Oral QHS   bisacodyl  10 mg Rectal Once   carvedilol  6.25 mg Oral BID WC   Chlorhexidine Gluconate Cloth  6 each Topical Q0600   Chlorhexidine Gluconate Cloth  6 each Topical Q0600   cholecalciferol  5,000 Units Oral Daily   melatonin  3 mg Oral QHS   methylPREDNISolone (SOLU-MEDROL) injection  40 mg Intravenous Q24H   mexiletine  200 mg Oral BID   multivitamin  1 tablet Oral QHS   pantoprazole  40 mg  Oral Daily   polyethylene glycol  17 g Oral Daily   scopolamine  1 patch Transdermal Q72H   sodium chloride flush  10-40 mL Intracatheter Q12H    Assessment: 61 yo male here with recurrent VT and with history of afib on apixaban (last dose 9/30 at ~ 11pm). He is also noted on CRRT. He is having trouble keeping po medications down due to nausea. Pharmacy consulted to dose heparin -He was on heparin 1000 units/hr on 9/26 and heparin level was at goal  Goal of Therapy:  Heparin level 0.3-0.7 units/ml aPTT 66-102 seconds Monitor platelets by anticoagulation protocol: Yes   Plan:  -Start heparin at 1000 units/hr -aPTT and heparin level in 8 hrs  Hildred Laser, PharmD Clinical Pharmacist **Pharmacist phone directory can now be found on [email or web address] ([email or web address]).  Listed under [place].

## 2022-09-04 NOTE — Progress Notes (Signed)
[facility] Progress Note   Assessment/ Plan:    [facility]  4h 53mn  450/ 1.5  116.4kg  AVF  15g  Hep 3000 - hectorol 7 ug iv tiw - no esa     Assessment/ Plan: VT storm / VF - pt rx'd w/ IV amio, po quinidine, sedation / intubation, paralytics and procainamide. Had more VT/ shocks/ amio.  On IV amio, procainamide stopped and now mexilitine started 9/27.  Not thought to be a VT ablation candidate.  Empiric pred 40 mg daily in case sarcoid is contributory (had restrictive pattern on TTE) Afib: on Eliquis Shock/ hypotension - off vaso, on cefepime ESRD - on HD TTS. SP CRRT 9/20- 9/22. And then 9/25-present. Will stop CRRT now and will switch to IHD tomorrow 10/1.  Have placed orders for lower than typical BFR, UF goal 1-2 L HFrEF / EF 20-25%/ sp ICD - not LVAD candidate due to esrd Volume - as above Anemia esrd - Hb 9.9, follow and give ESA if needed MBD ckd - CCa in range, phos a bit high.  Will resume auryxia 1 ac tid for now (usual dose is 2 ac). Phos is 4-5 range.  N/v: per primary, hasn't had a BM in 72 hrs, constipation vs ileus vs med effect, per primary.   Dispo: ICU  Subjective:    Seen in room.  CRRT not stopped yesterday, care order completed on the worklist but not done.  D/w primary RN this AM.  Will stop CRRT now.    Having nausea/ vomiting, started yesterday.     Objective:   BP 102/79   Pulse 80   Temp 97.6 F (36.4 C) (Oral)   Resp 16   Ht '5\' 7"'$  (1.702 m)   Wt 112.4 kg   SpO2 98%   BMI 38.81 kg/m   Physical Exam: Gen: sitting in bed, awake CVS: RRR Resp: shallow breaths  but clear anteriorly Abd: soft Ext: trace LE edema ACCESS: L AVF + T/B  Labs: BMET Recent Labs  Lab 09/01/22 0354 09/01/22 1550 09/02/22 0342 09/02/22 1016 09/02/22 1625 09/02/22 2211 09/03/22 0424 09/03/22 0429 09/03/22 1647 09/03/22 2328 09/04/22 0411 09/04/22 0520  NA 134* 136 136  135   < > 134* 136 136 137 138 138 138 141  K 5.6* 5.1 4.4  4.4    < > 3.6 3.9 3.9 3.9 3.9 3.3* 4.0 3.1*  CL 97* 101 96*  --  100  --   --  102 102  --  99  --   CO2 '24 26 26  '$ --  25  --   --  26 23  --  24  --   GLUCOSE 117* 133* 96  --  114*  --   --  117* 128*  --  115*  --   BUN 60* 49* 39*  --  29*  --   --  32* 38*  --  36*  --   CREATININE 6.22* 4.56* 3.34*  --  3.09*  --   --  3.11* 3.09*  --  2.96*  --   CALCIUM 8.3* 8.0* 8.5*  --  7.6*  --   --  8.6* 8.5*  --  9.3  --   PHOS 4.8* 3.4 4.0  --  2.4*  --   --  2.9 2.8  --  2.9  --    < > = values in this interval not displayed.   CBC Recent Labs  Lab 09/01/22 0354 09/02/22 0342 09/02/22 1016 09/03/22 0429 09/03/22 2328 09/04/22 0411 09/04/22 0520  WBC 12.7* 8.4  --  17.3*  --  16.4*  --   NEUTROABS 10.6* 6.6  --  16.4*  --  14.5*  --   HGB 10.4* 9.1*  9.5*   < > 10.2* 9.5* 10.2* 8.8*  HCT 30.9* 27.7*  28.0*   < > 30.4* 28.0* 31.1* 26.0*  MCV 97.8 99.3  --  98.4  --  98.7  --   PLT 213 149*  --  177  --  185  --    < > = values in this interval not displayed.      Medications:     amiodarone  150 mg Intravenous Once   apixaban  5 mg Oral BID   atorvastatin  80 mg Oral QHS   bisacodyl  10 mg Rectal Once   carvedilol  6.25 mg Oral BID WC   Chlorhexidine Gluconate Cloth  6 each Topical Q0600   Chlorhexidine Gluconate Cloth  6 each Topical Q0600   cholecalciferol  5,000 Units Oral Daily   fentaNYL (SUBLIMAZE) injection  50 mcg Intravenous Once   melatonin  3 mg Oral QHS   mexiletine  200 mg Oral BID   multivitamin  1 tablet Oral QHS   pantoprazole  40 mg Oral Daily   polyethylene glycol  17 g Oral Daily   predniSONE  40 mg Oral Q breakfast   sennosides  10 mL Oral BID   sodium chloride flush  10-40 mL Intracatheter Q12H     Madelon Lips MD 09/04/2022, 9:23 AM

## 2022-09-04 NOTE — Progress Notes (Signed)
NAME:  Victor Thompson, MRN:  [id number], DOB:  July 09, 1961, LOS: 54 ADMISSION DATE:  08/24/2022, CONSULTATION DATE:  09/04/22 REFERRING MD:  Aundra Dubin - HF, CHIEF COMPLAINT:  VT storm   History of Present Illness:  61 year old male with PMH Systolic HF (followed by Dr. Aundra Dubin, EF 25-30), H/O VT arrest, s/p ICD, on amiodarone. Presented from dialysis 9/19 with palpitations and feeling faint. In ED, recurrent episodes of VT with repeated [place] IVD discharges. Denied chest pain or dyspnea. Refractory despite shock x 3, (along with internal shocks), amiodarone, lopressor, and bolus of procainamide, (allergy to lidocaine with angioedema) Ultimately requiring intubation and paralytics.   Started on quinidine 9/20. Continued improvement. Extubated 9/22. Reintubated 9/24 in the setting of need for increased sedation and recurrent VT (monomorphic).   Pertinent Medical History:   Past Medical History:  Diagnosis Date   Acute on chronic systolic CHF (congestive heart failure) ([place]) 06/14/2020   Acute respiratory failure (HCC) 05/28/2022   AICD (automatic cardioverter/defibrillator) present    2015   Allergy    Asthma    no meds   Cardiac arrest [facility]) 2015   Chest pain 05/25/2022   CHF (congestive heart failure) (HCC)    Coronary artery disease    ESRD on hemodialysis (HCC)    ckd -stage 5   Hyperlipidemia    Hypertension    Myocardial infarction ([place])    Obesity    Pneumonia    Shortness of breath dyspnea    Sleep apnea    USES CPAP   Wears glasses     Significant Hospital Events: Including procedures, antibiotic start and stop dates in addition to other pertinent events   9/19 - Intubated, central line placed 9/20 - R/LHC performed, CVC placed into left internal jugular vein for CRRT 9/21 - No further VT  9/22 - Extubated, off pressors, off sedation, right radial arterial line removed, CRRT dc'd 9/24 - Reintubated in the setting of need for increased sedation and recurrent VT  (monomorphic). Rebolus with amiodarone. Procainamide started. IO placed. CVC placed later in the evening. Aline replaced. Brady/hypotensive overnight, amio decreased. NE/Vaso resumed. 9/25 - Remains on amio/procainamide. Brady in 75s. Off NE, on vaso 0.04. CVP 8-10. Keeping sedated per EP. 9/26 - HR 60s, remains off of NE, on vaso 0.04. CVP 14-15. Goal RASS -2 to -3 per EP. Versed changed to Propofol. Plan for CRRT vs. HD. 9/28 extubated to HFNC 9/29 Resp cultures with MSSA and Morganella. HFNC 50% and 40LPM 9/30 vomiting  Interim History / Subjective:  Multiple vomiting episodes overnight. Remained on CRRT overnight. Afebrile.   Objective:  Blood pressure 102/79, pulse 80, temperature 97.6 F (36.4 C), temperature source Oral, resp. rate 16, height '5\' 7"'$  (1.702 m), weight 112.4 kg, SpO2 98 %. CVP:  [0 mmHg-21 mmHg] 1 mmHg  FiO2 (%):  [30 %-60 %] 30 %   Intake/Output Summary (Last 24 hours) at 09/04/2022 1749 Last data filed at 09/04/2022 0700 Gross per 24 hour  Intake 683.86 ml  Output 2261 ml  Net -1577.14 ml    Filed Weights   09/02/22 0609 09/03/22 0656 09/04/22 0625  Weight: 116.6 kg 112.8 kg 112.4 kg   Physical Examination: General: Middle-age man sitting up in bed no acute distress HEENT: [place]/AT, eyes anicteric Cardio: S1-S2, regular rate and rhythm Resp: Breathing comfortably on heated high flow, no tachypnea or accessory muscle use.  Decreased basilar breath sounds, no rales. Abd: PEEP, soft, nontender Extremities: No peripheral edema, no cyanosis  Neuro: Awake alert, answering questions properly, moving all extremities Derm: Warm, dry, no diffuse rashes  BUN 36, Cr 2.96 (on CRRT) WBC 16.4 H/H 10.2/31.1 Platelets 185 Resp: MSSA, morganella  KUB per my read-small volume of air in the intestinal lumen, no gastric dilation  Assessment & Plan:  Recurrent ventricular storm  Shock in setting of ventricular storm > resolved  Acute on chronic HFrEF 2/2 ICM Paroxysmal  atrial fibrillation, new this admission S/p procainamide load 9/19. No sign of ischemic cause on R/LHC, likely due to volume overload with PWCP 26. Patient with recurrent VT (monomorphic), shocked by ICD x 5. -Planning on OP workup for possible cardiac sarcoidosis; when more stable planning for CT chest to look for possible lung involvement. Con't empiric steroids. - con't mexilitine, amiodarone, coreg - Not a good candidate for ablation or advanced therapies.  - monitor electrolytes and replete PRN - strict I/Os; goal euvolemia -eliquis for Afib prophylaxis, rate control with coreg  Acute respiratory failure with hypoxia requiring mechanical ventilation LLL pneumonia- MSSA and Morganella (R to amp, cefazolin) - cefepime x 7 days -remove volume with HD; stopping CRRT today - pulmonary hygiene -control nausea -wean FiO2  ESRD on HD Echo 08/25/2022 with EF 20-25%, global hypokinesis, Grade III diastolic dysfunction. - AHF/nephro teams following, appreciate management. - Push fluid removal  Constipation vs ileus vs gastroparesis- previously no signs of obstruction.  Promotility agents not ideal in setting of long QT and recurrent VT - repeat KUB -dulcolax, soaps suds enema if suppository ineffective - compazine PRN; watch Qtc -May need NG tube if persistent vomiting  Morbid obesity - NPO until nausea improves  Leukocytosis, possibly reactive from recurrent vomiting vs inappropriate antibiotics for aspiration -monitor -con't cefepime  Anemia of critical illness -monitor -transfuse for hb <7 or hemodynamically significant bleeding  Hyperglycemia, not requiring insulin -monitor -SSI PRN    Best Practice (right click and "Reselect all SmartList Selections" daily)   Diet/type:  DVT prophylaxis: DOAC GI prophylaxis: PPI Lines: Central line and Dialysis Catheter Foley:  N/A Code Status:  full code Last date of multidisciplinary goals of care discussion [fiancee updated  9/29]   This patient is critically ill with multiple organ system failure which requires frequent high complexity decision making, assessment, support, evaluation, and titration of therapies. This was completed through the application of advanced monitoring technologies and extensive interpretation of multiple databases. During this encounter critical care time was devoted to patient care services described in this note for 38 minutes.  Julian Hy, DO 09/04/22 10:02 AM [facility]

## 2022-09-05 ENCOUNTER — Inpatient Hospital Stay (HOSPITAL_COMMUNITY): Payer: Medicare Other

## 2022-09-05 DIAGNOSIS — K567 Ileus, unspecified: Secondary | ICD-10-CM

## 2022-09-05 DIAGNOSIS — K5903 Drug induced constipation: Secondary | ICD-10-CM

## 2022-09-05 LAB — CBC WITH DIFFERENTIAL/PLATELET
Abs Immature Granulocytes: 0.34 10*3/uL — ABNORMAL HIGH (ref 0.00–0.07)
Basophils Absolute: 0 10*3/uL (ref 0.0–0.1)
Basophils Relative: 0 %
Eosinophils Absolute: 0 10*3/uL (ref 0.0–0.5)
Eosinophils Relative: 0 %
HCT: 30.5 % — ABNORMAL LOW (ref 39.0–52.0)
Hemoglobin: 10.4 g/dL — ABNORMAL LOW (ref 13.0–17.0)
Immature Granulocytes: 2 %
Lymphocytes Relative: 4 %
Lymphs Abs: 0.7 10*3/uL (ref 0.7–4.0)
MCH: 33 pg (ref 26.0–34.0)
MCHC: 34.1 g/dL (ref 30.0–36.0)
MCV: 96.8 fL (ref 80.0–100.0)
Monocytes Absolute: 1.4 10*3/uL — ABNORMAL HIGH (ref 0.1–1.0)
Monocytes Relative: 8 %
Neutro Abs: 14.8 10*3/uL — ABNORMAL HIGH (ref 1.7–7.7)
Neutrophils Relative %: 86 %
Platelets: 221 10*3/uL (ref 150–400)
RBC: 3.15 MIL/uL — ABNORMAL LOW (ref 4.22–5.81)
RDW: 16 % — ABNORMAL HIGH (ref 11.5–15.5)
WBC: 17.2 10*3/uL — ABNORMAL HIGH (ref 4.0–10.5)
nRBC: 0.2 % (ref 0.0–0.2)

## 2022-09-05 LAB — HEPARIN LEVEL (UNFRACTIONATED): Heparin Unfractionated: >1.1 IU/mL — ABNORMAL HIGH (ref 0.30–0.70)

## 2022-09-05 LAB — GLUCOSE, CAPILLARY
Glucose-Capillary: 105 mg/dL — ABNORMAL HIGH (ref 70–99)
Glucose-Capillary: 112 mg/dL — ABNORMAL HIGH (ref 70–99)
Glucose-Capillary: 119 mg/dL — ABNORMAL HIGH (ref 70–99)
Glucose-Capillary: 123 mg/dL — ABNORMAL HIGH (ref 70–99)
Glucose-Capillary: 128 mg/dL — ABNORMAL HIGH (ref 70–99)
Glucose-Capillary: 132 mg/dL — ABNORMAL HIGH (ref 70–99)

## 2022-09-05 LAB — BASIC METABOLIC PANEL
Anion gap: 16 — ABNORMAL HIGH (ref 5–15)
BUN: 75 mg/dL — ABNORMAL HIGH (ref 6–20)
CO2: 21 mmol/L — ABNORMAL LOW (ref 22–32)
Calcium: 8.9 mg/dL (ref 8.9–10.3)
Chloride: 100 mmol/L (ref 98–111)
Creatinine, Ser: 5.57 mg/dL — ABNORMAL HIGH (ref 0.61–1.24)
GFR, Estimated: 11 mL/min — ABNORMAL LOW (ref >60)
Glucose, Bld: 115 mg/dL — ABNORMAL HIGH (ref 70–99)
Potassium: 4.4 mmol/L (ref 3.5–5.1)
Sodium: 137 mmol/L (ref 135–145)

## 2022-09-05 LAB — APTT: aPTT: 62 seconds — ABNORMAL HIGH (ref 24–36)

## 2022-09-05 MED ORDER — SORBITOL 70 % SOLN
300.0000 mL | TOPICAL_OIL | Freq: Once | ORAL | Status: AC
  Administered 2022-09-05: 300 mL via RECTAL
  Filled 2022-09-05: qty 90

## 2022-09-05 MED ORDER — CARVEDILOL 3.125 MG PO TABS
3.1250 mg | ORAL_TABLET | Freq: Two times a day (BID) | ORAL | Status: DC
  Administered 2022-09-05 – 2022-09-06 (×3): 3.125 mg via ORAL
  Filled 2022-09-05 (×3): qty 1

## 2022-09-05 MED ORDER — HEPARIN SODIUM (PORCINE) 1000 UNIT/ML IJ SOLN
INTRAMUSCULAR | Status: AC
  Filled 2022-09-05: qty 4

## 2022-09-05 NOTE — Progress Notes (Signed)
NAME:  Victor Thompson, MRN:  [id number], DOB:  Aug 22, 1961, LOS: 12 ADMISSION DATE:  08/24/2022, CONSULTATION DATE:  09/05/22 REFERRING MD:  Aundra Dubin - HF, CHIEF COMPLAINT:  VT storm   History of Present Illness:  61 year old male with PMH Systolic HF (followed by Dr. Aundra Dubin, EF 25-30), H/O VT arrest, s/p ICD, on amiodarone. Presented from dialysis 9/19 with palpitations and feeling faint. In ED, recurrent episodes of VT with repeated [place] IVD discharges. Denied chest pain or dyspnea. Refractory despite shock x 3, (along with internal shocks), amiodarone, lopressor, and bolus of procainamide, (allergy to lidocaine with angioedema) Ultimately requiring intubation and paralytics.   Started on quinidine 9/20. Continued improvement. Extubated 9/22. Reintubated 9/24 in the setting of need for increased sedation and recurrent VT (monomorphic).   Pertinent Medical History:   Past Medical History:  Diagnosis Date   Acute on chronic systolic CHF (congestive heart failure) ([place]) 06/14/2020   Acute respiratory failure (HCC) 05/28/2022   AICD (automatic cardioverter/defibrillator) present    2015   Allergy    Asthma    no meds   Cardiac arrest [facility]) 2015   Chest pain 05/25/2022   CHF (congestive heart failure) (HCC)    Coronary artery disease    ESRD on hemodialysis (HCC)    ckd -stage 5   Hyperlipidemia    Hypertension    Myocardial infarction ([place])    Obesity    Pneumonia    Shortness of breath dyspnea    Sleep apnea    USES CPAP   Wears glasses     Significant Hospital Events: Including procedures, antibiotic start and stop dates in addition to other pertinent events   9/19 - Intubated, central line placed 9/20 - R/LHC performed, CVC placed into left internal jugular vein for CRRT 9/21 - No further VT  9/22 - Extubated, off pressors, off sedation, right radial arterial line removed, CRRT dc'd 9/24 - Reintubated in the setting of need for increased sedation and recurrent VT  (monomorphic). Rebolus with amiodarone. Procainamide started. IO placed. CVC placed later in the evening. Aline replaced. Brady/hypotensive overnight, amio decreased. NE/Vaso resumed. 9/25 - Remains on amio/procainamide. Brady in 8s. Off NE, on vaso 0.04. CVP 8-10. Keeping sedated per EP. 9/26 - HR 60s, remains off of NE, on vaso 0.04. CVP 14-15. Goal RASS -2 to -3 per EP. Versed changed to Propofol. Plan for CRRT vs. HD. 9/28 extubated to HFNC 9/29 Resp cultures with MSSA and Morganella. HFNC 50% and 40LPM 9/30 vomiting  Interim History / Subjective:  No BM yesterday, NGT clamped this morning and has tolerated a few ice chips. No nausea since NGT placed.  Objective:  Blood pressure 136/70, pulse 67, temperature 98.3 F (36.8 C), temperature source Axillary, resp. rate 18, height '5\' 7"'$  (1.702 m), weight 116.7 kg, SpO2 95 %. CVP:  [1 mmHg-10 mmHg] 1 mmHg      Intake/Output Summary (Last 24 hours) at 09/05/2022 0951 Last data filed at 09/05/2022 0900 Gross per 24 hour  Intake 786 ml  Output 800 ml  Net -14 ml    Filed Weights   09/03/22 0656 09/04/22 0625 09/05/22 0337  Weight: 112.8 kg 112.4 kg 116.7 kg   Physical Examination: General: chronically ill appearing man lying in bed in NAD HEENT: [place]/AT, eyes anicteric. NGT in place Cardio: S1S2, RRR Resp: breathing comfortably on [place], CTAB, wet-sounding cough Abd: obese, soft, NT Extremities: no cyanosis, no peripheral edema Neuro: more fatigued, but awake and following commands, answering  questions appropriately Derm: no rashes, warm & dry  BUN 21, Cr 5.57 WBC 17.2 H/H 10.4/30.5 Platelets 185 Resp: MSSA, morganella   Assessment & Plan:  Recurrent ventricular storm  Shock in setting of ventricular storm > resolved  Acute on chronic HFrEF 2/2 ICM Paroxysmal atrial fibrillation, new this admission S/p procainamide load 9/19. No sign of ischemic cause on R/LHC, likely due to volume overload with PWCP 26. Patient with recurrent  VT (monomorphic), shocked by ICD x 5. -OP workup for cardiac sarcoidosis. Can go ahead and order non-contrast lung CT to look for possible sarcoidosis changes. Con't empiric steroids - con't mexilitine, amiodarone, coreg per EP; may consider switching to metoprolol for less BP effect -heparin while DOAC on hold due to vomiting - Not a good candidate for ablation or advanced therapies.  - monitor electrolytes - strict I/O -goal euvolemia -eliquis for Afib prophylaxis, rate control with coreg  Acute respiratory failure with hypoxia requiring mechanical ventilation LLL pneumonia- MSSA and Morganella (R to amp, cefazolin) - cefepime x 7 days (9/27 - 10/3) -volume management with HD -pulm hygiene, OOB mobility -wean FiO2  ESRD on HD Echo 08/25/2022 with EF 20-25%, global hypokinesis, Grade III diastolic dysfunction. -appreciate nephrology's management; iHD today  Constipation vs ileus vs gastroparesis- previously no signs of obstruction.  Promotility agents not ideal in setting of long QT and recurrent VT - clamped NGT today, trial of ice chips -con't scopolamine patch -OOB mobility today before HD -smog enema - compazine PRN; watch Qtc  Morbid obesity -clears only until bowel function returns  Leukocytosis, possibly reactive from recurrent vomiting vs inappropriate antibiotics for aspiration -con't cefepime until 10/3  Anemia of critical illness -transfuse for Hb <7 or hemodynamically significant bleeding  Hyperglycemia, not requiring insulin -SSI PRN -goal BG 140-180    Best Practice (right click and "Reselect all SmartList Selections" daily)   Diet/type: sips & chips DVT prophylaxis: DOAC GI prophylaxis: PPI Lines: Central line and Dialysis Catheter Foley:  N/A Code Status:  full code Last date of multidisciplinary goals of care discussion [fiancee updated 10/1]    Julian Hy, DO 09/05/22 10:04 AM [facility]

## 2022-09-05 NOTE — Progress Notes (Signed)
Received patient in bed to unit.  Alert and oriented.  Informed consent signed and in chart.   Treatment initiated: [place] Treatment completed: 1915  Patient tolerated well.  Transported back to the room  Alert, without acute distress.  Hand-off given to patient's nurse.   Access used: catheter. Access issues: positional  Total UF removed: 1 L Medication(s) given: none Post HD VS: 130/80 P 80 R 24 Post HD weight: 115 kg   [facility]

## 2022-09-05 NOTE — Progress Notes (Signed)
[facility] Progress Note   Assessment/ Plan:    [facility]  4h 74mn  450/ 1.5  116.4kg  AVF  15g  Hep 3000 - hectorol 7 ug iv tiw - no esa     Assessment/ Plan: VT storm / VF - pt rx'd w/ IV amio, po quinidine, sedation / intubation, paralytics and procainamide. Had more VT/ shocks/ amio.  On IV amio, procainamide stopped and now mexilitine started 9/27.  Not thought to be a VT ablation candidate.  Empiric pred 40 mg daily in case sarcoid is contributory (had restrictive pattern on TTE) Afib: on Eliquis Shock/ hypotension - off vaso, on cefepime ESRD - on HD TTS. SP CRRT 9/20- 9/22. And then 9/25-9/29 IHD 10/1.  Have placed orders for lower than typical BFR, UF goal 1-2 L HFrEF / EF 20-25%/ sp ICD - not LVAD candidate due to esrd Volume - as above Anemia esrd - Hb 9.9, follow and give ESA if needed MBD ckd - CCa in range, phos a bit high.  Will resume auryxia 1 ac tid for now (usual dose is 2 ac). Phos is 4-5 range.  N/v: per primary, NGT placed, feeling better, KUB OK, per primary Dispo: ICU  Subjective:    Seen in room.  Had NGT placed yesterday and feels better.  KUB OK.  Still no BM per him     Objective:   BP 136/70   Pulse 67   Temp 98.3 F (36.8 C) (Axillary)   Resp 18   Ht '5\' 7"'$  (1.702 m)   Wt 116.7 kg   SpO2 95%   BMI 40.30 kg/m   Physical Exam: Gen: sitting in bed, awake CVS: RRR Resp: shallow breaths  but clear anteriorly Abd: soft Ext: trace LE edema ACCESS: R AVF + T/B  Labs: BMET Recent Labs  Lab 09/01/22 0354 09/01/22 1550 09/02/22 0342 09/02/22 1016 09/02/22 1625 09/02/22 2211 09/03/22 0429 09/03/22 1647 09/03/22 2328 09/04/22 0411 09/04/22 0520 09/04/22 1122 09/05/22 0332  NA 134* 136 136  135   < > 134*   < > 137 138 138 138 141 136 137  K 5.6* 5.1 4.4  4.4   < > 3.6   < > 3.9 3.9 3.3* 4.0 3.1* 4.1 4.4  CL 97* 101 96*  --  100  --  102 102  --  99  --   --  100  CO2 '24 26 26  '$ --  25  --  26 23  --  24  --    --  21*  GLUCOSE 117* 133* 96  --  114*  --  117* 128*  --  115*  --   --  115*  BUN 60* 49* 39*  --  29*  --  32* 38*  --  36*  --   --  75*  CREATININE 6.22* 4.56* 3.34*  --  3.09*  --  3.11* 3.09*  --  2.96*  --   --  5.57*  CALCIUM 8.3* 8.0* 8.5*  --  7.6*  --  8.6* 8.5*  --  9.3  --   --  8.9  PHOS 4.8* 3.4 4.0  --  2.4*  --  2.9 2.8  --  2.9  --   --   --    < > = values in this interval not displayed.   CBC Recent Labs  Lab 09/02/22 0342 09/02/22 1016 09/03/22 0429 09/03/22 2328 09/04/22 0411 09/04/22 0520 09/04/22  1122 09/05/22 0332  WBC 8.4  --  17.3*  --  16.4*  --   --  17.2*  NEUTROABS 6.6  --  16.4*  --  14.5*  --   --  14.8*  HGB 9.1*  9.5*   < > 10.2*   < > 10.2* 8.8* 11.2* 10.4*  HCT 27.7*  28.0*   < > 30.4*   < > 31.1* 26.0* 33.0* 30.5*  MCV 99.3  --  98.4  --  98.7  --   --  96.8  PLT 149*  --  177  --  185  --   --  221   < > = values in this interval not displayed.      Medications:     amiodarone  150 mg Intravenous Once   atorvastatin  80 mg Oral QHS   carvedilol  3.125 mg Oral BID WC   Chlorhexidine Gluconate Cloth  6 each Topical Q0600   Chlorhexidine Gluconate Cloth  6 each Topical Q0600   cholecalciferol  5,000 Units Oral Daily   melatonin  3 mg Oral QHS   methylPREDNISolone (SOLU-MEDROL) injection  40 mg Intravenous Q24H   mexiletine  200 mg Oral BID   multivitamin  1 tablet Oral QHS   pantoprazole  40 mg Oral Daily   polyethylene glycol  17 g Oral Daily   scopolamine  1 patch Transdermal Q72H   sodium chloride flush  10-40 mL Intracatheter Q12H   sorbitol, milk of mag, mineral oil, glycerin (SMOG) enema  300 mL Rectal Once     Madelon Lips MD 09/05/2022, 9:55 AM

## 2022-09-05 NOTE — Progress Notes (Signed)
Pt states he is comfortable and isn't interested in his home CPAP tonight

## 2022-09-05 NOTE — Progress Notes (Signed)
ANTICOAGULATION CONSULT NOTE  Pharmacy Consult for heparin Indication: atrial fibrillation Brief A/P: aPTT subtherapeutic Increase Heparin rate  Allergies  Allergen Reactions   Ace Inhibitors Swelling    Swelling of the tongue   Influenza Vaccines Hives and Swelling    SWELLING REACTION UNSPECIFIED    Ketorolac Swelling    SWELLING REACTION UNSPECIFIED    Lidocaine Swelling    TONGUE SWELLS   Lisinopril Swelling    TONGUE SWELLING Pt reported problem with a BP med which sounded like  lisinopril  But as of 09/19/06,pt had tolerated altace without problem   Penicillins Swelling    TONGUE SWELLS Has patient had a PCN reaction causing immediate rash, facial/tongue/throat swelling, SOB or lightheadedness with hypotension: Yes Has patient had a PCN reaction causing severe rash involving mucus membranes or skin necrosis: No Has patient had a PCN reaction that required hospitalization No Has patient had a PCN reaction occurring within the last 10 years: Yes If all of the above answers are "NO", then may proceed with Cephalosporin use.     Patient Measurements: Height: '5\' 7"'$  (170.2 cm) Weight: 116.7 kg (257 lb 4.4 oz) IBW/kg (Calculated) : 66.1 HEPARIN DW (KG): 93.5   Vital Signs: Temp: 98.3 F (36.8 C) (10/01 0415) Temp Source: Axillary (10/01 0415) BP: 130/75 (09/30 1900) Pulse Rate: 86 (09/30 1900)  Labs: Recent Labs    09/03/22 0429 09/03/22 1647 09/03/22 2328 09/04/22 0411 09/04/22 0520 09/04/22 1122 09/04/22 2036 09/05/22 0332  HGB 10.2*  --    < > 10.2* 8.8* 11.2*  --  10.4*  HCT 30.4*  --    < > 31.1* 26.0* 33.0*  --  30.5*  PLT 177  --   --  185  --   --   --  221  APTT  --   --   --   --   --   --  58* 62*  HEPARINUNFRC  --   --   --   --   --   --  >1.10* >1.10*  CREATININE 3.11* 3.09*  --  2.96*  --   --   --  5.57*   < > = values in this interval not displayed.     Estimated Creatinine Clearance: 17.6 mL/min (A) (by C-G formula based on SCr of 5.57  mg/dL (H)).  Assessment: 61 y.o. male with h/o Afib, Eliquis on hold, for heparin  Goal of Therapy:  Heparin level 0.3-0.7 units/ml aPTT 66-102 seconds Monitor platelets by anticoagulation protocol: Yes   Plan:  Increase Heparin 1400 units/hr  Phillis Knack, PharmD, BCPS  09/05/2022 5:12 AM

## 2022-09-05 NOTE — Progress Notes (Addendum)
Patient ID: Victor Thompson, male   DOB: 1960/12/21, 61 y.o.   MRN: [id number]     Advanced Heart Failure Rounding Note  PCP-Cardiologist: Loralie Champagne, MD   Subjective:    Admitted with VT storm.   9/24 multiple repeated shocks for VT monomorphic, reintubated, rebolused with amio, quinidine now on hold, started on procainamide.   9/25 Given concern for possible cardiac sarcoid, started empirically on prednisone. IV procainamide discontinued by EP  9/27 started on ceftriaxone for suspected aspiration PNA. Mexiletine started for VT suppression   9/28 Extubated  Extubated, down to 1L [place].  He is on cefepime for aspiration PNA.  WBCs 17=>16=>17, afebrile. Trach aspirate with MSSA and Morganella.   CVVH off, CVP 1.    He remains on amiodarone gtt at 30 + mexiletine.  He is in NSR today.   Ongoing nausea, NGT placed and did better overnight. Currently NPO.    RHC/LHC (9/20):  Coronary Findings  Diagnostic Dominance: Right Left Anterior Descending  Ost LAD to Prox LAD lesion is 40% stenosed.    First Diagonal Branch  1st Diag lesion is 60% stenosed.    Left Circumflex  Mid Cx lesion is 30% stenosed.    Right Coronary Artery  Prox RCA lesion is 30% stenosed.    Intervention   No interventions have been documented.   Right Heart  Right Heart Pressures RHC Procedural Findings: Hemodynamics (mmHg) RA mean 14 RV 52/15 PA 53/27, mean 37 PCWP mean 26 LV 97/29 AO 103/64  Oxygen saturations: PA 76% AO 100%  Cardiac Output (Fick) 8.98  Cardiac Index (Fick) 3.96 PVR 2.8 WU    Objective:   Weight Range: 116.7 kg Body mass index is 40.3 kg/m.   Vital Signs:   Temp:  [97.6 F (36.4 C)-98.9 F (37.2 C)] 98.3 F (36.8 C) (10/01 0415) Pulse Rate:  [63-92] 63 (10/01 0700) Resp:  [14-23] 16 (10/01 0700) BP: (84-148)/(53-97) 110/53 (10/01 0700) SpO2:  [91 %-100 %] 95 % (10/01 0700) Arterial Line BP: (85-175)/(45-84) 117/57 (10/01 0700) FiO2 (%):  [30 %] 30 %  (09/30 0828) Weight:  [116.7 kg] 116.7 kg (10/01 0337) Last BM Date :  (PTA)  Weight change: Filed Weights   09/03/22 0656 09/04/22 0625 09/05/22 0337  Weight: 112.8 kg 112.4 kg 116.7 kg    Intake/Output:   Intake/Output Summary (Last 24 hours) at 09/05/2022 0731 Last data filed at 09/05/2022 0600 Gross per 24 hour  Intake 678.5 ml  Output --  Net 678.5 ml      Physical Exam   CVP 1 General: NAD Neck: No JVD, no thyromegaly or thyroid nodule.  Lungs: Clear to auscultation bilaterally with normal respiratory effort. CV: Nondisplaced PMI.  Heart regular S1/S2, no S3/S4, no murmur.  No peripheral edema.   Abdomen: Soft, nontender, no hepatosplenomegaly, no distention.  Skin: Intact without lesions or rashes.  Neurologic: Alert and oriented x 3.  Psych: Normal affect. Extremities: No clubbing or cyanosis.  HEENT: Normal.    Telemetry   NSR, no VT (personally reviewed)   EKG    N/A  Labs    CBC Recent Labs    09/04/22 0411 09/04/22 0520 09/04/22 1122 09/05/22 0332  WBC 16.4*  --   --  17.2*  NEUTROABS 14.5*  --   --  14.8*  HGB 10.2*   < > 11.2* 10.4*  HCT 31.1*   < > 33.0* 30.5*  MCV 98.7  --   --  96.8  PLT 185  --   --  221   < > = values in this interval not displayed.   Basic Metabolic Panel Recent Labs    09/03/22 0429 09/03/22 1647 09/03/22 2328 09/04/22 0411 09/04/22 0520 09/04/22 1122 09/05/22 0332  NA 137 138   < > 138   < > 136 137  K 3.9 3.9   < > 4.0   < > 4.1 4.4  CL 102 102  --  99  --   --  100  CO2 26 23  --  24  --   --  21*  GLUCOSE 117* 128*  --  115*  --   --  115*  BUN 32* 38*  --  36*  --   --  75*  CREATININE 3.11* 3.09*  --  2.96*  --   --  5.57*  CALCIUM 8.6* 8.5*  --  9.3  --   --  8.9  MG 2.8*  --   --  2.9*  --   --   --   PHOS 2.9 2.8  --  2.9  --   --   --    < > = values in this interval not displayed.   Liver Function Tests Recent Labs    09/03/22 1647 09/04/22 0411  ALBUMIN 2.9* 2.9*   No results for  input(s): "LIPASE", "AMYLASE" in the last 72 hours. Cardiac Enzymes No results for input(s): "CKTOTAL", "CKMB", "CKMBINDEX", "TROPONINI" in the last 72 hours.  BNP: BNP (last 3 results) Recent Labs    05/25/22 0705 08/16/22 0627 08/16/22 1257  BNP 1,355.4* QUANTITY NOT SUFFICIENT, UNABLE TO PERFORM TEST 399.2*    ProBNP (last 3 results) No results for input(s): "PROBNP" in the last 8760 hours.   D-Dimer No results for input(s): "DDIMER" in the last 72 hours. Hemoglobin A1C No results for input(s): "HGBA1C" in the last 72 hours. Fasting Lipid Panel No results for input(s): "CHOL", "HDL", "LDLCALC", "TRIG", "CHOLHDL", "LDLDIRECT" in the last 72 hours.  Thyroid Function Tests No results for input(s): "TSH", "T4TOTAL", "T3FREE", "THYROIDAB" in the last 72 hours.  Invalid input(s): "FREET3"   Other results:   Imaging    DG Abd 1 View  Result Date: 09/04/2022 CLINICAL DATA:  Emesis EXAM: ABDOMEN - 1 VIEW COMPARISON:  None Available. FINDINGS: No dilated large or small bowel. Small amount gas in the rectum. LEFT basilar atelectasis. IMPRESSION: No bowel obstruction. Electronically Signed   By: Suzy Bouchard M.D.   On: 09/04/2022 11:50   DG Abd 1 View  Result Date: 09/04/2022 CLINICAL DATA:  NG tube placement EXAM: ABDOMEN - 1 VIEW COMPARISON:  None Available. FINDINGS: NG tube extends into the stomach. Tip in the gastric pyloric region. IMPRESSION: NG tube in distal stomach. Electronically Signed   By: Suzy Bouchard M.D.   On: 09/04/2022 11:28     Medications:     Scheduled Medications:  amiodarone  150 mg Intravenous Once   atorvastatin  80 mg Oral QHS   carvedilol  3.125 mg Oral BID WC   Chlorhexidine Gluconate Cloth  6 each Topical Q0600   Chlorhexidine Gluconate Cloth  6 each Topical Q0600   cholecalciferol  5,000 Units Oral Daily   melatonin  3 mg Oral QHS   methylPREDNISolone (SOLU-MEDROL) injection  40 mg Intravenous Q24H   mexiletine  200 mg Oral BID    multivitamin  1 tablet Oral QHS   pantoprazole  40 mg Oral Daily   polyethylene glycol  17 g Oral Daily   scopolamine  1 patch Transdermal Q72H   sodium chloride flush  10-40 mL Intracatheter Q12H    Infusions:  sodium chloride Stopped (08/26/22 1922)   amiodarone 30 mg/hr (09/05/22 0600)   anticoagulant sodium citrate     ceFEPime (MAXIPIME) IV Stopped (09/04/22 2216)   heparin 1,400 Units/hr (09/05/22 0616)   norepinephrine (LEVOPHED) Adult infusion Stopped (09/02/22 0544)    PRN Medications: acetaminophen, albuterol, alteplase, anticoagulant sodium citrate, fentaNYL, heparin, LORazepam, LORazepam, mouth rinse, prochlorperazine, sodium chloride flush    Patient Profile   Victor Thompson is 61 year old with a history of VT, ESRD, CAD, ischemic CMP, and ESRD. He had PCI in 0962, uncertain what vessel was involved. Echo 2/22: EF 25-30% with moderate LV dilation and normal RV.  He had VT arrest in 2015, has [employer] subcutaneous ICD.  [name] in 2/22.  LHC/RHC was done in 8/22, showing no significant CAD and stable hemodynamics.   Admitted with refractory VT complicated by shock.    Assessment/Plan   1. VT storm: Has has a Animator ICD.  Refractory monomorphic VT. Multiple shocks.  Later alternating NSR with a slow wide complex AIVR-like rhythm. Recurrent monomorphic VT 9/24 w/ repeated shocks>>re-intubated, rebolused with amio, quinidine discontinued, started on procainamide, now off.  Given concern for possible cardiac sarcoid, started empirically on prednisone, currently on 40 mg daily. Cath this admit with nonobstructive CAD. Echo EF 20-25%, GIIIDD (restrictive), RV normal. No further VT, remains on amiodarone gtt 30 mg/hr + mexiletine.  - EP has decided against VT ablation, do not think it would be likely to be successful and high risk.  - Continue amiodarone gtt 30 mg/hr today, would not switch to po yet with nausea/vomiting.  - Mexiletine 200 mg bid added for  suppression, continue. Tolerating ok, no signs of angioedema and he has been able to keep this down when given.   - Coreg 3.125 mg bid as long as BP stable.  - Long term very concerning, not candidate for LVAD with ESRD.   - Continue empiric prednisone 40 mg daily  - Would like eventual cardiac PET for sarcoidosis but can't do at this center yet.  Will need to be at [facility] after discharge.  - Would get CT chest when more stable/PNA treated to look for evidence of pulmonary sarcoidosis.  2. Acute on chronic systolic CHF with cardiogenic shock: In setting of incessant VT.  H/o NICM, echo this admission with EF 20-25%, normal RV function, mild-moderate Victor, IVC dilated.  At last admission, unable to complete cardiac MRI due to artifact from [facility] ICD.  PYP scan was negative.  RHC this admit showed volume overload>>CRRT for volume removal.  CVVH now off, CVP 1.  Off pressors.   - iHD for volume management.  - Will try to continue low dose Coreg.   3. CAD: ?PCI in 2005, unsure what vessel.  Coronary angiography in 8/22 showed no significant CAD and also does not appear to show prior stent so h/o CAD is questionable. LHC this admission with nonobstructive CAD.  - Continue statin. - No ASA with anticoagulation.  4. ESRD: Since 2/22. TTS HD.  - Ready for iHD transition, timing per renal (CVP low).   5. ID: Suspect aspiration PNA.  Growing MSSA and Morganella from sputum.  Afebrile with WBCs 17.  - Continue cefepime.   6. Atrial fibrillation: Paroxysmal, noted this admission. Has also had AFL.  Back to NSR this morning.   - Continue amiodarone  -  Continue Eliquis  5 mg bid  7. Nausea/vomiting: Cause uncertain.  Not clearly associated with a medication.  KUB unremarkable.  Has NGT now. Did better overnight.  - Bowel regimen - Try with ice chips => clears  Mobilize  CRITICAL CARE Performed by: Loralie Champagne  Total critical care time: 35 minutes  Critical care time was exclusive of separately billable  procedures and treating other patients.  Critical care was necessary to treat or prevent imminent or life-threatening deterioration.  Critical care was time spent personally by me on the following activities: development of treatment plan with patient and/or surrogate as well as nursing, discussions with consultants, evaluation of patient's response to treatment, examination of patient, obtaining history from patient or surrogate, ordering and performing treatments and interventions, ordering and review of laboratory studies, ordering and review of radiographic studies, pulse oximetry and re-evaluation of patient's condition.  Loralie Champagne 09/05/2022 7:31 AM

## 2022-09-06 ENCOUNTER — Inpatient Hospital Stay (HOSPITAL_COMMUNITY): Payer: Medicare Other

## 2022-09-06 LAB — RENAL FUNCTION PANEL
Albumin: 2.8 g/dL — ABNORMAL LOW (ref 3.5–5.0)
Albumin: 2.8 g/dL — ABNORMAL LOW (ref 3.5–5.0)
Anion gap: 17 — ABNORMAL HIGH (ref 5–15)
Anion gap: 18 — ABNORMAL HIGH (ref 5–15)
BUN: 76 mg/dL — ABNORMAL HIGH (ref 6–20)
BUN: 76 mg/dL — ABNORMAL HIGH (ref 6–20)
CO2: 24 mmol/L (ref 22–32)
CO2: 23 mmol/L (ref 22–32)
Calcium: 9.2 mg/dL (ref 8.9–10.3)
Calcium: 8.8 mg/dL — ABNORMAL LOW (ref 8.9–10.3)
Chloride: 96 mmol/L — ABNORMAL LOW (ref 98–111)
Chloride: 97 mmol/L — ABNORMAL LOW (ref 98–111)
Creatinine, Ser: 3.51 mg/dL — ABNORMAL HIGH (ref 0.61–1.24)
Creatinine, Ser: 6.22 mg/dL — ABNORMAL HIGH (ref 0.61–1.24)
GFR, Estimated: 19 mL/min — ABNORMAL LOW (ref >60)
GFR, Estimated: 10 mL/min — ABNORMAL LOW (ref >60)
Glucose, Bld: 21 mg/dL — CL (ref 70–99)
Glucose, Bld: 109 mg/dL — ABNORMAL HIGH (ref 70–99)
Phosphorus: <1 mg/dL — CL (ref 2.5–4.6)
Phosphorus: 5.2 mg/dL — ABNORMAL HIGH (ref 2.5–4.6)
Potassium: 3.9 mmol/L (ref 3.5–5.1)
Potassium: 3.7 mmol/L (ref 3.5–5.1)
Sodium: 137 mmol/L (ref 135–145)
Sodium: 138 mmol/L (ref 135–145)

## 2022-09-06 LAB — CBC WITH DIFFERENTIAL/PLATELET
Abs Immature Granulocytes: 0.3 10*3/uL — ABNORMAL HIGH (ref 0.00–0.07)
Basophils Absolute: 0 10*3/uL (ref 0.0–0.1)
Basophils Relative: 0 %
Eosinophils Absolute: 0 10*3/uL (ref 0.0–0.5)
Eosinophils Relative: 0 %
HCT: 31.3 % — ABNORMAL LOW (ref 39.0–52.0)
Hemoglobin: 10.5 g/dL — ABNORMAL LOW (ref 13.0–17.0)
Immature Granulocytes: 2 %
Lymphocytes Relative: 5 %
Lymphs Abs: 0.9 10*3/uL (ref 0.7–4.0)
MCH: 32.3 pg (ref 26.0–34.0)
MCHC: 33.5 g/dL (ref 30.0–36.0)
MCV: 96.3 fL (ref 80.0–100.0)
Monocytes Absolute: 1.7 10*3/uL — ABNORMAL HIGH (ref 0.1–1.0)
Monocytes Relative: 11 %
Neutro Abs: 13.1 10*3/uL — ABNORMAL HIGH (ref 1.7–7.7)
Neutrophils Relative %: 82 %
Platelets: 228 10*3/uL (ref 150–400)
RBC: 3.25 MIL/uL — ABNORMAL LOW (ref 4.22–5.81)
RDW: 16.2 % — ABNORMAL HIGH (ref 11.5–15.5)
WBC: 15.9 10*3/uL — ABNORMAL HIGH (ref 4.0–10.5)
nRBC: 0.1 % (ref 0.0–0.2)

## 2022-09-06 LAB — APTT
aPTT: 136 seconds — ABNORMAL HIGH (ref 24–36)
aPTT: 117 seconds — ABNORMAL HIGH (ref 24–36)
aPTT: 82 seconds — ABNORMAL HIGH (ref 24–36)

## 2022-09-06 LAB — HEPARIN LEVEL (UNFRACTIONATED): Heparin Unfractionated: >1.1 IU/mL — ABNORMAL HIGH (ref 0.30–0.70)

## 2022-09-06 LAB — GLUCOSE, CAPILLARY
Glucose-Capillary: 110 mg/dL — ABNORMAL HIGH (ref 70–99)
Glucose-Capillary: 147 mg/dL — ABNORMAL HIGH (ref 70–99)
Glucose-Capillary: 145 mg/dL — ABNORMAL HIGH (ref 70–99)
Glucose-Capillary: 152 mg/dL — ABNORMAL HIGH (ref 70–99)
Glucose-Capillary: 156 mg/dL — ABNORMAL HIGH (ref 70–99)

## 2022-09-06 LAB — HEPATITIS B SURFACE ANTIGEN

## 2022-09-06 MED ORDER — CHLORHEXIDINE GLUCONATE CLOTH 2 % EX PADS
6.0000 | MEDICATED_PAD | Freq: Every day | CUTANEOUS | Status: DC
  Administered 2022-09-07 – 2022-09-08 (×2): 6 via TOPICAL

## 2022-09-06 MED ORDER — PANTOPRAZOLE SODIUM 40 MG PO TBEC
40.0000 mg | DELAYED_RELEASE_TABLET | Freq: Every day | ORAL | Status: DC
  Administered 2022-09-06 – 2022-09-10 (×5): 40 mg via ORAL
  Filled 2022-09-06 (×5): qty 1

## 2022-09-06 NOTE — Progress Notes (Addendum)
Electrophysiology Rounding Note  [name] Date of Encounter: 09/06/2022  Primary Cardiologist: Loralie Champagne, MD Electrophysiologist: Cristopher Peru, MD   Subjective   NAEON. Requests for NG tube to be removed, states it's uncomfortable.   Inpatient Medications    Scheduled Meds:  amiodarone  150 mg Intravenous Once   atorvastatin  80 mg Oral QHS   carvedilol  3.125 mg Oral BID WC   Chlorhexidine Gluconate Cloth  6 each Topical Q0600   Chlorhexidine Gluconate Cloth  6 each Topical Q0600   cholecalciferol  5,000 Units Oral Daily   melatonin  3 mg Oral QHS   methylPREDNISolone (SOLU-MEDROL) injection  40 mg Intravenous Q24H   mexiletine  200 mg Oral BID   multivitamin  1 tablet Oral QHS   pantoprazole  40 mg Oral Daily   polyethylene glycol  17 g Oral Daily   scopolamine  1 patch Transdermal Q72H   sodium chloride flush  10-40 mL Intracatheter Q12H   Continuous Infusions:  sodium chloride Stopped (08/26/22 1922)   amiodarone 30 mg/hr (09/06/22 0700)   anticoagulant sodium citrate     ceFEPime (MAXIPIME) IV Stopped (09/05/22 2232)   heparin 1,300 Units/hr (09/06/22 0700)   PRN Meds: acetaminophen, albuterol, alteplase, anticoagulant sodium citrate, fentaNYL, heparin, LORazepam, LORazepam, mouth rinse, prochlorperazine, sodium chloride flush   Vital Signs    Vitals:   09/06/22 0414 09/06/22 0500 09/06/22 0600 09/06/22 0700  BP:  (!) 129/96 108/78 122/78  Pulse:  81 65 69  Resp:  (!) 21 (!) 21 18  Temp:      TempSrc:      SpO2:  (!) 89% 95% 92%  Weight: 112.9 kg     Height:        Intake/Output Summary (Last 24 hours) at 09/06/2022 0801 Last data filed at 09/06/2022 0700 Gross per 24 hour  Intake 1083.69 ml  Output 1000 ml  Net 83.69 ml    Filed Weights   09/05/22 0337 09/05/22 1850 09/06/22 0414  Weight: 116.7 kg 115 kg 112.9 kg    Physical Exam    GEN- lying in bed sleeping, Morbidly obese  HEENT- [place] in place, benign  otherwise Lungs- CTA Heart- RRR, no murmurs, rubs or gallops GI- soft, +BS Extremities- no clubbing or cyanosis. trace edema Skin- no rash or lesion Neuro- answers questions appropriately  Labs    CBC Recent Labs    09/05/22 0332 09/06/22 0354  WBC 17.2* 15.9*  NEUTROABS 14.8* 13.1*  HGB 10.4* 10.5*  HCT 30.5* 31.3*  MCV 96.8 96.3  PLT 221 354    Basic Metabolic Panel Recent Labs    09/04/22 0411 09/04/22 0520 09/06/22 0354 09/06/22 0454  NA 138   < > 137 138  K 4.0   < > 3.9 3.7  CL 99   < > 96* 97*  CO2 24   < > 24 23  GLUCOSE 115*   < > 21* 109*  BUN 36*   < > 76* 76*  CREATININE 2.96*   < > 3.51* 6.22*  CALCIUM 9.3   < > 9.2 8.8*  MG 2.9*  --   --   --   PHOS 2.9  --  <1.0* 5.2*   < > = values in this interval not displayed.    Liver Function Tests Recent Labs    09/06/22 0354 09/06/22 0454  ALBUMIN 2.8* 2.8*    No results for input(s): "LIPASE", "AMYLASE" in the last 72 hours. Cardiac  Enzymes No results for input(s): "CKTOTAL", "CKMB", "CKMBINDEX", "TROPONINI" in the last 72 hours.   Telemetry    ?afib this AM. SR otherwise  Radiology     08/25/22: [facility] Left Anterior Descending  Ost LAD to Prox LAD lesion is 40% stenosed.    First Diagonal Branch  1st Diag lesion is 60% stenosed.    Left Circumflex  Mid Cx lesion is 30% stenosed.    Right Coronary Artery  Prox RCA lesion is 30% stenosed.     Right Heart Pressures RHC Procedural Findings: Hemodynamics (mmHg) RA mean 14 RV 52/15 PA 53/27, mean 37 PCWP mean 26 LV 97/29 AO 103/64  Oxygen saturations: PA 76% AO 100%  Cardiac Output (Fick) 8.98  Cardiac Index (Fick) 3.96 PVR 2.8 WU    08/25/2022: TTE  1. Left ventricular ejection fraction, by estimation, is 20 to 25%. The  left ventricle has severely decreased function. The left ventricle  demonstrates global hypokinesis. The left ventricular internal cavity size  was severely dilated. Left ventricular  diastolic  parameters are consistent with Grade III diastolic dysfunction  (restrictive). Elevated left ventricular end-diastolic pressure.   2. Right ventricular systolic function is normal. The right ventricular  size is mildly enlarged. There is mildly elevated pulmonary artery  systolic pressure.   3. Left atrial size was moderately dilated.   4. Right atrial size was mildly dilated.   5. The mitral valve is normal in structure. Mild to moderate mitral valve  regurgitation. No evidence of mitral stenosis.   6. The aortic valve is grossly normal. Aortic valve regurgitation is not  visualized. No aortic stenosis is present.   7. The inferior vena cava is dilated in size with <50% respiratory  variability, suggesting right atrial pressure of 15 mmHg.   8. Cannot exclude a small PFO.   Comparison(s): Changes from prior study are noted. LV chamber now severely  dilated (7 cm).    10/21/21: TTE  1. Left ventricular ejection fraction, by estimation, is 25 to 30%. The  left ventricle has severely decreased function. The left ventricle  demonstrates global hypokinesis. The left ventricular internal cavity size  was moderately dilated. Left  ventricular diastolic parameters are consistent with Grade I diastolic  dysfunction (impaired relaxation).   2. Right ventricular systolic function is normal. The right ventricular  size is normal. There is normal pulmonary artery systolic pressure. The  estimated right ventricular systolic pressure is 96.2 mmHg.   3. The mitral valve is normal in structure. Mild mitral valve  regurgitation. No evidence of mitral stenosis.   4. The aortic valve is tricuspid. Aortic valve regurgitation is not  visualized. No aortic stenosis is present.   5. The inferior vena cava is normal in size with greater than 50%  respiratory variability, suggesting right atrial pressure of 3 mmHg.    07/08/21; LHC 1. Normal filling pressures and normal cardiac output.  2. No significant  CAD.  Nonischemic cardiomyopathy.   Patient Profile     Victor Thompson is a 61 y.o. male with a hx of  hx of VF arrest (2015), CAD (PCI in 2005, unknown details), ICM,. Chronic CHF (systolic), ESRF on HD , OSA w/CPAP, HTN, HLD, recurrent angio edema who was admitted for another VT storm.    Assessment & Plan    1.  VT storm / refractory VT Was shocked 9/24 by both sICD and external defib. Doing well -- minimal, brief NSVT but having frequent atrial and ventricular ectopy. SICD Battery ~  16% as of 10/2 - heparin gtt; eliquis being held for NPO for nausea - Amio gtt decreased back to 30 - continue coreg 6.25,Consider switching to metoprolol XL  - tolerating mexilitine without angioedema - Failed quinidine   2. Chronic systolic CHF AHF team is on board -Coronary angiography in 07/2021 showed no significant CAD, and also did not appear to show prior "stent" which had been reported.  -R/LHC 08/25/2022 with Moderate non obstructive CAD and elevated filling pressures as above.  -PYP scan negative  -Had been planned for PET as outpatient for possible Sarcoid.  - HRCT today  -ECG with IVCD 150 msec, thought not to be good candidate for CRT upgrade (not true LBBB) -Currently off pressors -Started on steroids if perhaps/possible sarcoid playing a role  3. ESRD Nephrology following.  Back on iHD   4. Acute hypoxemic respiratory failure Extubated 9/28 Appreciate HF and CCM care  5. AFib/flutter  Most tele events appear to be atrial extopy and flutter (eg 9/30 06:42:44 "Pacer not pacing" event shows flutter.   For questions or updates, please contact [place] Please consult [email or web address] for contact info under Cardiology/STEMI.  Signed, Mamie Levers, NP  09/06/2022, 8:01 AM   EP attending  Patient seen and examined.  Agree with the findings as noted above.  The patient has remained quiet with regard to his ventricular tachycardia storm.  He is tolerating the combination of  mexiletine and intravenous amiodarone.  He would like to go home.  His heart failure is fairly well compensated.  He is off positive inotropic agents.  He is undergoing dialysis.  He did have an episode of atrial ectopy which may have been an atypical flutter.  He is currently back in sinus rhythm.  His exam demonstrates minimal rales in the bases cardiovascular exam which is regular rate and rhythm.  His extremities are warm and his neurologic exam is grossly intact.  He states he would like to go home as soon as possible.  Interrogation of his ICD demonstrates that he has 16% battery longevity.  At this point we will continue intravenous amiodarone and switch to oral amiodarone tomorrow if he remains free of additional VT.  Cristopher Peru, MD

## 2022-09-06 NOTE — Progress Notes (Signed)
Ana from [employer] called requesting for this to be faxed back to [phone] Best contact: [phone]

## 2022-09-06 NOTE — TOC Initial Note (Signed)
Transition of Care [facility]) - Initial/Assessment Note    Patient Details  Name: Victor Thompson MRN: [id number] Date of Birth: [birth year]  Transition of Care [facility]) CM/SW Contact:    Erenest Rasher, RN Phone Number: [phone] 09/06/2022, 4:14 PM  Clinical Narrative:                 HF TOC CM spoke to pt at bedside. Pt wants [facility] IP rehab instead of a IP rehab in [place]. States he has discussed with his dtr. States his SO lives in [place] and will be able to assist with care at home. Lives with cousin and she will assist as needed. Cousin works from 1:30 pm-6:30 pm. CIR following for dc needs.   Expected Discharge Plan: IP Rehab Facility Barriers to Discharge: Continued Medical Work up   Patient Goals and CMS Choice        Expected Discharge Plan and Services Expected Discharge Plan: [place]   Discharge Planning Services: CM Consult   Living arrangements for the past 2 months: Single Family Home                  Prior Living Arrangements/Services Living arrangements for the past 2 months: Single Family Home Lives with:: Relatives Patient language and need for interpreter reviewed:: Yes                 Activities of Daily Living Home Assistive Devices/Equipment: None ADL Screening (condition at time of admission) Patient's cognitive ability adequate to safely complete daily activities?: Yes Is the patient deaf or have difficulty hearing?: No Does the patient have difficulty seeing, even when wearing glasses/contacts?: No Does the patient have difficulty concentrating, remembering, or making decisions?: No Patient able to express need for assistance with ADLs?: Yes Does the patient have difficulty dressing or bathing?: No Independently performs ADLs?: Yes (appropriate for developmental age) Does the patient have difficulty walking or climbing stairs?: No Weakness of Legs: None Weakness of Arms/Hands: None  Permission Sought/Granted Permission  sought to share information with : Case Manager, Family Supports, PCP    Share Information with NAME: Rossie Bretado     Permission granted to share info w Relationship: cousin  Permission granted to share info w Contact Information: [phone]  Emotional Assessment              Admission diagnosis:  Paroxysmal ventricular tachycardia ([place]) [I47.29] Ventricular tachyarrhythmia ([place]) [I47.20] Patient Active Problem List   Diagnosis Date Noted   Paroxysmal atrial fibrillation (HCC)    Nausea and vomiting    Paroxysmal ventricular tachycardia ([place]) 08/24/2022   VT (ventricular tachycardia) ([place]) 08/16/2022   Chronic obstructive pulmonary disease, unspecified COPD type ([place]) 06/18/2022   Acute respiratory failure with hypoxia ([place]) 05/28/2022   AICD discharge 05/25/2022   Seasonal and perennial allergic rhinitis 11/12/2021   Moderate persistent asthma, uncomplicated 73/71/0626   STD exposure 08/27/2021   ESRD on dialysis ([place]) 02/06/2021   Hypervolemia associated with renal insufficiency 01/12/2021   Angioedema 09/22/2020   Acute on chronic HFrEF (heart failure with reduced ejection fraction) ([place]) 06/14/2020   Secondary hyperparathyroidism of renal origin ([place]) 07/15/2019   Vitamin D deficiency 07/15/2019   CAD (coronary artery disease), native coronary artery 04/10/2019   Chronic midline low back pain without sciatica 02/17/2018   Ventricular tachycardia ([place]) 03/29/2016   Defibrillator discharge    HFrEF (heart failure with reduced ejection fraction) ([place]) 01/22/2016   Erectile dysfunction 01/22/2016   Allergic  rhinitis 01/22/2016   GERD (gastroesophageal reflux disease) 01/22/2016   PROTEINURIA 02/17/2010   Dilated cardiomyopathy ([place]) 02/16/2010   Obstructive sleep apnea 02/16/2010   EXTERNAL HEMORRHOIDS 09/26/2009   Tobacco use disorder in remission 06/03/2009   Essential hypertension 10/01/2008   Hyperlipidemia 07/14/2007   Class 3 severe obesity due to excess  calories with serious comorbidity and body mass index (BMI) of 40.0 to 44.9 in adult [facility]) 07/14/2007   Asthma 07/14/2007   PCP:  [name], MD Pharmacy:   [facility] [address] [place] [address] Phone: [phone] Fax: [phone]  [facility] - [place], [address] [place] [address] Phone: [phone] Fax: [phone]     Social Determinants of Health (SDOH) Interventions    Readmission Risk Interventions     No data to display

## 2022-09-06 NOTE — Progress Notes (Signed)
ANTICOAGULATION CONSULT NOTE   Pharmacy Consult for heparin Indication: atrial fibrillation  Allergies  Allergen Reactions   Ace Inhibitors Swelling    Swelling of the tongue   Influenza Vaccines Hives and Swelling    SWELLING REACTION UNSPECIFIED    Ketorolac Swelling    SWELLING REACTION UNSPECIFIED    Lidocaine Swelling    TONGUE SWELLS   Lisinopril Swelling    TONGUE SWELLING Pt reported problem with a BP med which sounded like  lisinopril  But as of 09/19/06,pt had tolerated altace without problem   Penicillins Swelling    TONGUE SWELLS Has patient had a PCN reaction causing immediate rash, facial/tongue/throat swelling, SOB or lightheadedness with hypotension: Yes Has patient had a PCN reaction causing severe rash involving mucus membranes or skin necrosis: No Has patient had a PCN reaction that required hospitalization No Has patient had a PCN reaction occurring within the last 10 years: Yes If all of the above answers are "NO", then may proceed with Cephalosporin use.     Patient Measurements: Height: '5\' 7"'$  (170.2 cm) Weight: 112.9 kg (248 lb 14.4 oz) IBW/kg (Calculated) : 66.1 HEPARIN DW (KG): 93.5   Vital Signs: Temp: 97.8 F (36.6 C) (10/02 1100) Temp Source: Axillary (10/02 1100) BP: 113/63 (10/02 0800) Pulse Rate: 65 (10/02 1300)  Labs: Recent Labs    09/04/22 0411 09/04/22 0520 09/04/22 1122 09/04/22 1122 09/04/22 2036 09/05/22 0332 09/06/22 0354 09/06/22 0454 09/06/22 1149  HGB 10.2*   < > 11.2*  --   --  10.4* 10.5*  --   --   HCT 31.1*   < > 33.0*  --   --  30.5* 31.3*  --   --   PLT 185  --   --   --   --  221 228  --   --   APTT  --   --   --    < > 58* 62* 136*  --  117*  HEPARINUNFRC  --   --   --   --  >1.10* >1.10* >1.10*  --   --   CREATININE 2.96*  --   --   --   --  5.57* 3.51* 6.22*  --    < > = values in this interval not displayed.     Estimated Creatinine Clearance: 15.5 mL/min (A) (by C-G formula based on SCr of 6.22  mg/dL (H)).   Assessment: 61 yo male here with recurrent VT and with history of afib on apixaban (last dose 9/30 at ~ 11pm). He is also noted on CRRT. He is having trouble keeping po medications down due to nausea. Pharmacy consulted to dose heparin  Repeat aPTT remains elevated at 117 seconds, no S/Sx bleeding per RN.  Goal of Therapy:  Heparin level 0.3-0.7 units/ml aPTT 66-102 seconds Monitor platelets by anticoagulation protocol: Yes   Plan:  Reduce heparin to 1100 units/h Recheck aPTT in 8h  Arrie Senate, PharmD, [place], [facility] Clinical Pharmacist [phone] Please check AMION for all [place] numbers 09/06/2022

## 2022-09-06 NOTE — Progress Notes (Addendum)
Inpatient Rehab Admissions Coordinator:   Addendum: Patient is still on amiodarone drip. Will hold sending to insurance company until tomorrow.    Met with patient this morning who states he has a friend who can stay with him upon discharge and would like to pursue CIR here. I will open insurance today. Will likely be a couple of days until we get answer from insurance. Will continue to follow for potential admission.  Rehab Admissons Coordinator Kristyn Conetta, PT, G[facility] [phone]       

## 2022-09-06 NOTE — Progress Notes (Signed)
Pharmacy Antibiotic Note  HILMAN KISSLING is a 61 y.o. male with MSSA and morganella PNA. Pharmacy has been consulted for cefepime dosing (started 9/29). Pt is on iHD.  Plan: -Continue cefepime 1gm IV q24h through 10/3 -Will follow RRT plans  Height: '5\' 7"'$  (170.2 cm) Weight: 112.9 kg (248 lb 14.4 oz) IBW/kg (Calculated) : 66.1  Temp (24hrs), Avg:98 F (36.7 C), Min:97.6 F (36.4 C), Max:98.7 F (37.1 C)  Recent Labs  Lab 09/02/22 0342 09/02/22 1625 09/03/22 0429 09/03/22 1647 09/04/22 0411 09/05/22 0332 09/06/22 0354 09/06/22 0454  WBC 8.4  --  17.3*  --  16.4* 17.2* 15.9*  --   CREATININE 3.34*   < > 3.11* 3.09* 2.96* 5.57* 3.51* 6.22*   < > = values in this interval not displayed.     Estimated Creatinine Clearance: 15.5 mL/min (A) (by C-G formula based on SCr of 6.22 mg/dL (H)).    Allergies  Allergen Reactions   Ace Inhibitors Swelling    Swelling of the tongue   Influenza Vaccines Hives and Swelling    SWELLING REACTION UNSPECIFIED    Ketorolac Swelling    SWELLING REACTION UNSPECIFIED    Lidocaine Swelling    TONGUE SWELLS   Lisinopril Swelling    TONGUE SWELLING Pt reported problem with a BP med which sounded like  lisinopril  But as of 09/19/06,pt had tolerated altace without problem   Penicillins Swelling    TONGUE SWELLS Has patient had a PCN reaction causing immediate rash, facial/tongue/throat swelling, SOB or lightheadedness with hypotension: Yes Has patient had a PCN reaction causing severe rash involving mucus membranes or skin necrosis: No Has patient had a PCN reaction that required hospitalization No Has patient had a PCN reaction occurring within the last 10 years: Yes If all of the above answers are "NO", then may proceed with Cephalosporin use.     Antimicrobials this admission: -CTX 9/27>9/29 -Cefepime 9/29 >>  Dose adjustments this admission:   Microbiology results: 9/27 Resp Cx: Morganella (R to amp, ancef, intermed Unasyn) and  MSSA  Thank you for allowing pharmacy to be a part of this patient's care.  Arrie Senate, PharmD, BCPS, [facility] Clinical Pharmacist [phone] Please check AMION for all [place] numbers 09/06/2022

## 2022-09-06 NOTE — Progress Notes (Signed)
[facility] Progress Note  Subjective: seen in room, up in the chair, no CP or SOB, no cough  Vitals:   09/06/22 1000 09/06/22 1100 09/06/22 1200 09/06/22 1300  BP:      Pulse: 70 63 64 65  Resp: 16 18 (!) 21 (!) 23  Temp:  97.8 F (36.6 C)    TempSrc:  Axillary    SpO2: 93% 91% 100% 100%  Weight:      Height:        Exam: Gen: sitting in bed, awake CVS: RRR Resp: shallow breaths  but clear anteriorly Abd: soft obese, nontender Ext: trace LE edema ACCESS: R AVF + T/B    OP HD: [place] [facility]TS  4h 63mn  450/ 1.5  116.4kg  AVF  15g  Hep 3000 - hectorol 7 ug iv tiw - no esa     Assessment/ Plan: VT storm / VF - pt rx'd w/ IV amio, po quinidine, sedation / intubation, paralytics and procainamide and improved. Then 9/24 had more VT/ shocks and was reintubated and restarted on IV amio w/ procainamide.  Procainamide stopped and mexilitine started 9/27, IV amio continued.  Not thought to be a VT ablation candidate.  Empiric pred 40 mg daily in case sarcoid is contributory (had restrictive pattern on TTE) Afib: on Eliquis Shock/ hypotension - off vaso, on IV cefepime ESRD - on HD TTS. SP CRRT 9/20- 9/22 and again from 9/25-9/29. Now on iHD, had HD yesterday 10/1. Next HD 10/03. Lower UFG and BFR a bit.  HFrEF / EF 20-25%/ sp ICD - not LVAD candidate due to esrd Volume - under dry wt 3kg, no resp issues. UF 1.5- 2 L tomorrow.  Anemia esrd - Hb 10s, follow and give ESA if needed MBD ckd - CCa in range, phos a bit high.  Cont auryxia 1 ac tid for now (usual dose is 2 ac). Phos is 4-5 range.  N/v - per primary NGT placed, feeling better, KUB OK, per primary  Victor Thompson 09/06/2022, 2:55 PM   Recent Labs  Lab 09/05/22 0332 09/06/22 0354 09/06/22 0454  HGB 10.4* 10.5*  --   ALBUMIN  --  2.8* 2.8*  CALCIUM 8.9 9.2 8.8*  PHOS  --  <1.0* 5.2*  CREATININE 5.57* 3.51* 6.22*  K 4.4 3.9 3.7   No results for input(s): "IRON", "TIBC", "FERRITIN" in the last 168  hours. Inpatient medications:  amiodarone  150 mg Intravenous Once   atorvastatin  80 mg Oral QHS   carvedilol  3.125 mg Oral BID WC   Chlorhexidine Gluconate Cloth  6 each Topical Q0600   Chlorhexidine Gluconate Cloth  6 each Topical Q0600   cholecalciferol  5,000 Units Oral Daily   melatonin  3 mg Oral QHS   methylPREDNISolone (SOLU-MEDROL) injection  40 mg Intravenous Q24H   mexiletine  200 mg Oral BID   multivitamin  1 tablet Oral QHS   pantoprazole  40 mg Oral Daily   polyethylene glycol  17 g Oral Daily   scopolamine  1 patch Transdermal Q72H   sodium chloride flush  10-40 mL Intracatheter Q12H    sodium chloride Stopped (08/26/22 1922)   amiodarone 30 mg/hr (09/06/22 1300)   anticoagulant sodium citrate     ceFEPime (MAXIPIME) IV Stopped (09/05/22 2232)   heparin 1,300 Units/hr (09/06/22 1300)   acetaminophen, albuterol, alteplase, anticoagulant sodium citrate, fentaNYL, heparin, LORazepam, LORazepam, mouth rinse, prochlorperazine, sodium chloride flush

## 2022-09-06 NOTE — Progress Notes (Signed)
No current pulmonary or critical care needs at this time, discussed with Dr. Valeta Harms and  PCCM will sign off.  Please re-consult for any concerns.   Otilio Carpen Kaisley Stiverson, PA-C [place] [facility] See Amion for pager If no response to pager , please call [phone] until 7pm After 7:00 pm call [place]  [phone]

## 2022-09-06 NOTE — Progress Notes (Signed)
Physical Therapy Treatment Patient Details Name: SALVATORE SHEAR MRN: [id number] DOB: November 19, 1961 Today's Date: 09/06/2022   History of Present Illness Pt is a 61 y.o. male admitted from HD on 08/24/22 with c/o palpitations, feeling faint. In ED, recurrent episodes of VT with repeated ICD discharges. ETT 9/19-9/22. R/LHC on 9/20. CRRT 9/20-9/22, 9/26-9/29. 9/24 VT and reintubated. extubated 9/28. PMH includes CHF, AICD, CHF, CAD, ESRD (on HD), HTN, MI, HLD, OSA (uses CPAP), obesity, asthma.    PT Comments    Pt with significant improvement in mobility and activity off CRRT and on RA this session. Pt able to progress to gait in hall with min assist and need for assist to direct RW. Pt has flight of stairs at home and family member to assist. Pt educated for HEP and progression with significant fatigue end of session. Will continue to follow to maximize independence.   SpO2 88-94% on RA HR 82-86   Recommendations for follow up therapy are one component of a multi-disciplinary discharge planning process, led by the attending physician.  Recommendations may be updated based on patient status, additional functional criteria and insurance authorization.  Follow Up Recommendations  Acute inpatient rehab (3hours/day)     Assistance Recommended at Discharge Frequent or constant Supervision/Assistance  Patient can return home with the following A little help with walking and/or transfers;A little help with bathing/dressing/bathroom;Assistance with cooking/housework;Help with stairs or ramp for entrance   Equipment Recommendations  Rolling walker (2 wheels)    Recommendations for Other Services       Precautions / Restrictions Precautions Precautions: Fall;Other (comment) Precaution Comments: Watch SpO2     Mobility  Bed Mobility Overal bed mobility: Needs Assistance Bed Mobility: Supine to Sit     Supine to sit: Min assist, HOB elevated     General bed mobility comments: HOB 10  degrees with use of rail, increased time and min assist to fully elevate trunk    Transfers Overall transfer level: Needs assistance   Transfers: Sit to/from Stand Sit to Stand: Min assist           General transfer comment: min assist to stand from bed with cues for hand placement and safety    Ambulation/Gait Ambulation/Gait assistance: Min assist Gait Distance (Feet): 200 Feet Assistive device: Rolling walker (2 wheels) Gait Pattern/deviations: Step-through pattern, Decreased stride length, Trunk flexed, Drifts right/left   Gait velocity interpretation: <1.8 ft/sec, indicate of risk for recurrent falls   General Gait Details: pt with flexed trunk over RW able to walk with tendency to veer to right. chair followed but not needed next session with SPO2 89-92% on RA with gait   Stairs             Wheelchair Mobility    Modified Rankin (Stroke Patients Only)       Balance Overall balance assessment: Needs assistance Sitting-balance support: No upper extremity supported, Feet supported Sitting balance-Leahy Scale: Fair     Standing balance support: Reliant on assistive device for balance, Bilateral upper extremity supported Standing balance-Leahy Scale: Poor Standing balance comment: Rw in standing                            Cognition Arousal/Alertness: Awake/alert Behavior During Therapy: WFL for tasks assessed/performed Overall Cognitive Status: Within Functional Limits for tasks assessed  Exercises General Exercises - Lower Extremity Long Arc Quad: AROM, Both, Seated, 20 reps Hip Flexion/Marching: AROM, Both, 15 reps, Seated    General Comments        Pertinent Vitals/Pain Pain Assessment Pain Assessment: 0-10 Pain Score: 4  Pain Location: scrotum Pain Descriptors / Indicators: Sore Pain Intervention(s): Limited activity within patient's tolerance, Monitored during session,  Repositioned    Home Living                          Prior Function            PT Goals (current goals can now be found in the care plan section) Progress towards PT goals: Progressing toward goals    Frequency    Min 3X/week      PT Plan Current plan remains appropriate    Co-evaluation              AM-PAC PT "6 Clicks" Mobility   Outcome Measure  Help needed turning from your back to your side while in a flat bed without using bedrails?: A Little Help needed moving from lying on your back to sitting on the side of a flat bed without using bedrails?: A Little Help needed moving to and from a bed to a chair (including a wheelchair)?: A Little Help needed standing up from a chair using your arms (e.g., wheelchair or bedside chair)?: A Little Help needed to walk in hospital room?: A Little Help needed climbing 3-5 steps with a railing? : A Lot 6 Click Score: 17    End of Session   Activity Tolerance: Patient tolerated treatment well Patient left: in chair;with call bell/phone within reach;with chair alarm set Nurse Communication: Mobility status PT Visit Diagnosis: Other abnormalities of gait and mobility (R26.89);Muscle weakness (generalized) (M62.81)     Time: 1610-9604 PT Time Calculation (min) (ACUTE ONLY): 28 min  Charges:  $Gait Training: 8-22 mins $Therapeutic Activity: 8-22 mins                     [name], PT Acute Rehabilitation Services Office: [place] 09/06/2022, 9:07 AM

## 2022-09-06 NOTE — Progress Notes (Addendum)
Patient ID: Victor Thompson, male   DOB: Apr 09, 1961, 61 y.o.   MRN: [id number]     Advanced Heart Failure Rounding Note  PCP-Cardiologist: Loralie Champagne, MD   Subjective:    Admitted with VT storm.   9/24 multiple repeated shocks for VT monomorphic, reintubated, rebolused with amio, quinidine now on hold, started on procainamide.   9/25 Given concern for possible cardiac sarcoid, started empirically on prednisone. IV procainamide discontinued by EP  9/27 started on ceftriaxone for suspected aspiration PNA. Mexiletine started for VT suppression   9/28 Extubated  Extubated, down to 1L [place].  He is on cefepime for aspiration PNA.  WBCs 17=>16=>17=>16K, afebrile. Trach aspirate with MSSA and Morganella.   iHD on 10/01. 1L volume removed. CVP variable with respirations but appears to be 5-6.   SBP stable 100s-110s  He remains on amiodarone gtt at 30 + mexiletine.  He is in NSR currently, ? A couple of hours of afib earlier this am.  Currently NPO. Nausea has resolved. Wants NGT removed. Requesting solids.   RHC/LHC (9/20):  Coronary Findings  Diagnostic Dominance: Right Left Anterior Descending  Ost LAD to Prox LAD lesion is 40% stenosed.    First Diagonal Branch  1st Diag lesion is 60% stenosed.    Left Circumflex  Mid Cx lesion is 30% stenosed.    Right Coronary Artery  Prox RCA lesion is 30% stenosed.    Intervention   No interventions have been documented.   Right Heart  Right Heart Pressures RHC Procedural Findings: Hemodynamics (mmHg) RA mean 14 RV 52/15 PA 53/27, mean 37 PCWP mean 26 LV 97/29 AO 103/64  Oxygen saturations: PA 76% AO 100%  Cardiac Output (Fick) 8.98  Cardiac Index (Fick) 3.96 PVR 2.8 WU    Objective:   Weight Range: 112.9 kg Body mass index is 38.98 kg/m.   Vital Signs:   Temp:  [97.6 F (36.4 C)-98.7 F (37.1 C)] 97.6 F (36.4 C) (10/02 0300) Pulse Rate:  [56-80] 77 (10/02 0400) Resp:  [15-29] 18 (10/02 0400) BP:  (95-157)/(52-96) 125/82 (10/02 0400) SpO2:  [89 %-97 %] 89 % (10/02 0400) Arterial Line BP: (87-162)/(52-78) 149/78 (10/02 0400) Weight:  [112.9 kg-115 kg] 112.9 kg (10/02 0414) Last BM Date : 09/05/22  Weight change: Filed Weights   09/05/22 0337 09/05/22 1850 09/06/22 0414  Weight: 116.7 kg 115 kg 112.9 kg    Intake/Output:   Intake/Output Summary (Last 24 hours) at 09/06/2022 0703 Last data filed at 09/06/2022 0400 Gross per 24 hour  Intake 1072.69 ml  Output 1800 ml  Net -727.31 ml      Physical Exam   General:  Well appearing. Lying in bed. HEENT: normal Neck: supple. no JVD. Carotids 2+ bilat; no bruits. R IJ HD cath. Cor: PMI nondisplaced. Regular rate & rhythm. No rubs, gallops or murmurs. Lungs: clear Abdomen: soft, nontender, nondistended.  Extremities: no cyanosis, clubbing, rash, edema Neuro: alert & orientedx3, cranial nerves grossly intact. Affect pleasant    Telemetry   SR 60s, ? About 2hrs afib this am  EKG    N/A  Labs    CBC Recent Labs    09/05/22 0332 09/06/22 0354  WBC 17.2* 15.9*  NEUTROABS 14.8* 13.1*  HGB 10.4* 10.5*  HCT 30.5* 31.3*  MCV 96.8 96.3  PLT 221 742   Basic Metabolic Panel Recent Labs    09/04/22 0411 09/04/22 0520 09/06/22 0354 09/06/22 0454  NA 138   < > 137 138  K 4.0   < >  3.9 3.7  CL 99   < > 96* 97*  CO2 24   < > 24 23  GLUCOSE 115*   < > 21* 109*  BUN 36*   < > 76* 76*  CREATININE 2.96*   < > 3.51* 6.22*  CALCIUM 9.3   < > 9.2 8.8*  MG 2.9*  --   --   --   PHOS 2.9  --  <1.0* 5.2*   < > = values in this interval not displayed.   Liver Function Tests Recent Labs    09/06/22 0354 09/06/22 0454  ALBUMIN 2.8* 2.8*   No results for input(s): "LIPASE", "AMYLASE" in the last 72 hours. Cardiac Enzymes No results for input(s): "CKTOTAL", "CKMB", "CKMBINDEX", "TROPONINI" in the last 72 hours.  BNP: BNP (last 3 results) Recent Labs    05/25/22 0705 08/16/22 0627 08/16/22 1257  BNP 1,355.4*  QUANTITY NOT SUFFICIENT, UNABLE TO PERFORM TEST 399.2*    ProBNP (last 3 results) No results for input(s): "PROBNP" in the last 8760 hours.   D-Dimer No results for input(s): "DDIMER" in the last 72 hours. Hemoglobin A1C No results for input(s): "HGBA1C" in the last 72 hours. Fasting Lipid Panel No results for input(s): "CHOL", "HDL", "LDLCALC", "TRIG", "CHOLHDL", "LDLDIRECT" in the last 72 hours.  Thyroid Function Tests No results for input(s): "TSH", "T4TOTAL", "T3FREE", "THYROIDAB" in the last 72 hours.  Invalid input(s): "FREET3"   Other results:   Imaging    CT CHEST WO CONTRAST  Result Date: 09/05/2022 CLINICAL DATA:  Chest pain, clinical concern for cardiac sarcoidosis EXAM: CT CHEST WITHOUT CONTRAST TECHNIQUE: Multidetector CT imaging of the chest was performed following the standard protocol without IV contrast. RADIATION DOSE REDUCTION: This exam was performed according to the departmental dose-optimization program which includes automated exposure control, adjustment of the mA and/or kV according to patient size and/or use of iterative reconstruction technique. COMPARISON:  Previous CT done on 03/14/2008 and chest radiographs including the examination done on 09/03/2022 FINDINGS: Cardiovascular: Heart is enlarged in size. Coronary artery calcifications are seen. There is an electronic device in left lateral chest wall with lead extending to the presternal subcutaneous plane. Right IJ central venous catheter is seen with its tip in superior vena cava. There is left IJ dialysis catheter with its tip in the region of left innominate vein. Mediastinum/Nodes: There are slightly enlarged lymph nodes in mediastinum which appear slightly more prominent. Lungs/Pleura: There are patchy infiltrates in the lingula and both lower lobes. There is mild ectasia of bronchi in the lower lobes. There is interval decrease in pulmonary vascular congestion. There are no signs of interstitial  fibrosis in the upper and mid lung fields making the possibility of pulmonary sarcoidosis less likely. There is no pleural effusion or pneumothorax. Upper Abdomen: There is 2.2 cm nodular density in the region upper pole of right kidney. This may be partial volume averaging of upper pole of right kidney. Less likely possibility would be adrenal nodule. Musculoskeletal: There is encroachment of neural foramina by bony spurs at C6-C7 level in lower cervical spine. IMPRESSION: There are patchy infiltrates in both lower lung fields suggesting atelectasis/pneumonia. There are slightly enlarged lymph nodes in mediastinum, possibly reactive hyperplasia of lymph nodes. Cardiomegaly.  Coronary artery calcifications are seen. 2.2 cm smooth marginated soft tissue density structure in the inferior margin of right adrenal most likely is partial volume averaging of upper pole of right kidney. Less likely possibility would be a nodule arising from the right adrenal.  Follow-up CT abdomen may be considered for further evaluation. Cervical spondylosis with encroachment of neural foramina in the visualized lower cervical spine. Electronically Signed   By: Elmer Picker M.D.   On: 09/05/2022 12:25     Medications:     Scheduled Medications:  amiodarone  150 mg Intravenous Once   atorvastatin  80 mg Oral QHS   carvedilol  3.125 mg Oral BID WC   Chlorhexidine Gluconate Cloth  6 each Topical Q0600   Chlorhexidine Gluconate Cloth  6 each Topical Q0600   cholecalciferol  5,000 Units Oral Daily   melatonin  3 mg Oral QHS   methylPREDNISolone (SOLU-MEDROL) injection  40 mg Intravenous Q24H   mexiletine  200 mg Oral BID   multivitamin  1 tablet Oral QHS   pantoprazole  40 mg Oral Daily   polyethylene glycol  17 g Oral Daily   scopolamine  1 patch Transdermal Q72H   sodium chloride flush  10-40 mL Intracatheter Q12H    Infusions:  sodium chloride Stopped (08/26/22 1922)   amiodarone 30 mg/hr (09/06/22 0400)    anticoagulant sodium citrate     ceFEPime (MAXIPIME) IV Stopped (09/05/22 2232)   heparin 1,300 Units/hr (09/06/22 0640)   norepinephrine (LEVOPHED) Adult infusion Stopped (09/02/22 0544)    PRN Medications: acetaminophen, albuterol, alteplase, anticoagulant sodium citrate, fentaNYL, heparin, LORazepam, LORazepam, mouth rinse, prochlorperazine, sodium chloride flush    Patient Profile   Victor Thompson is 61 year old with a history of VT, ESRD, CAD, ischemic CMP, and ESRD. He had PCI in 1610, uncertain what vessel was involved. Echo 2/22: EF 25-30% with moderate LV dilation and normal RV.  He had VT arrest in 2015, has [employer] subcutaneous ICD.  [name] in 2/22.  LHC/RHC was done in 8/22, showing no significant CAD and stable hemodynamics.   Admitted with refractory VT complicated by shock.    Assessment/Plan   1. VT storm: Has has a Animator ICD.  Refractory monomorphic VT. Multiple shocks.  Later alternating NSR with a slow wide complex AIVR-like rhythm. Recurrent monomorphic VT 9/24 w/ repeated shocks>>re-intubated, rebolused with amio, quinidine discontinued, started on procainamide, now off.  Given concern for possible cardiac sarcoid, started empirically on prednisone, currently on 40 mg daily. Cath this admit with nonobstructive CAD. Echo EF 20-25%, GIIIDD (restrictive), RV normal. No further VT, remains on amiodarone gtt 30 mg/hr + mexiletine.  - EP has decided against VT ablation, do not think it would be likely to be successful and high risk.  - Continue amiodarone gtt 30 mg/hr today, would not switch to po yet with nausea/vomiting.  - Mexiletine 200 mg bid added for suppression, continue. Tolerating ok, no signs of angioedema and he has been able to keep this down when given.   - Coreg 3.125 mg bid as long as BP stable.  - Long term very concerning, not candidate for LVAD with ESRD.   - Continue empiric prednisone 40 mg daily  - Would like eventual cardiac PET for  sarcoidosis but can't do at this center yet.  Will need to be at [facility] after discharge.  - Would get CT chest when more stable/PNA treated to look for evidence of pulmonary sarcoidosis.  2. Acute on chronic systolic CHF with cardiogenic shock: In setting of incessant VT.  H/o NICM, echo this admission with EF 20-25%, normal RV function, mild-moderate Victor, IVC dilated.  At last admission, unable to complete cardiac MRI due to artifact from [facility] ICD.  PYP scan was negative.  RHC this admit showed volume overload>>CRRT for volume removal.  Off CRRT. Off pressors.   - iHD on 10/01. CVP 5-6 - Will try to continue low dose Coreg.   3. CAD: ?PCI in 2005, unsure what vessel.  Coronary angiography in 8/22 showed no significant CAD and also does not appear to show prior stent so h/o CAD is questionable. LHC this admission with nonobstructive CAD.  - Continue statin. - No ASA with anticoagulation.  4. ESRD: Since 2/22. TTS HD.  - Now back on iHD, first session 10/01.  - Nephrology following. 5. ID: Suspect aspiration PNA.  Growing MSSA and Morganella from sputum.  Afebrile with WBCs 17.  - Continue cefepime.   6. Atrial fibrillation: Paroxysmal, noted this admission. Has also had AFL.  Back to NSR this morning.   - Continue IV amio. Transition to po once no longer NPO. - Continue heparin gtt for now. Transition back to Eliquis once tolerating diet. 7. Nausea/vomiting: Cause uncertain.  Not clearly associated with a medication.  KUB unremarkable.  Has NGT. Tolerating clears. Nausea resolved. Requesting solid. ? Advancing diet.  Mobilize. PT/OT recommending SNF for rehab. ? CIR.    FINCH, LINDSAY N 09/06/2022 7:03 AM  Patient seen with PA, agree with the above note.   He is stable this morning, in NSR on amiodarone gtt + mexiletine.  Had HD yesterday.  CVP 5-6.   No nausea this morning, wants NGT out.   General: NAD Neck: No JVD, no thyromegaly or thyroid nodule.  Lungs: Clear to auscultation  bilaterally with normal respiratory effort. CV: Nondisplaced PMI.  Heart regular S1/S2, no S3/S4, no murmur.  No peripheral edema.   Abdomen: Soft, nontender, no hepatosplenomegaly, no distention.  Skin: Intact without lesions or rashes.  Neurologic: Alert and oriented x 3.  Psych: Normal affect. Extremities: No clubbing or cyanosis.  HEENT: Normal.   Continue IV amiodarone until taking po stably then to po.  Transition to Eliquis when taking po stably.  Continue mexiletine.    Advance diet and remove NGT, nausea resolved.   Cefepime for aspiration PNA.    Will get HRCT chest today to look for any sign of pulmonary sarcoidosis.   Loralie Champagne 09/06/2022 7:40 AM

## 2022-09-06 NOTE — Progress Notes (Signed)
ANTICOAGULATION CONSULT NOTE - Follow Up Consult  Pharmacy Consult for heparin Indication: atrial fibrillation  Labs: Recent Labs    09/03/22 1647 09/03/22 2328 09/04/22 0411 09/04/22 0520 09/04/22 1122 09/04/22 2036 09/05/22 0332 09/06/22 0354  HGB  --    < > 10.2*   < > 11.2*  --  10.4* 10.5*  HCT  --    < > 31.1*   < > 33.0*  --  30.5* 31.3*  PLT  --   --  185  --   --   --  221 228  APTT  --   --   --   --   --  58* 62* 136*  HEPARINUNFRC  --   --   --   --   --  >1.10* >1.10* >1.10*  CREATININE 3.09*  --  2.96*  --   --   --  5.57*  --    < > = values in this interval not displayed.    Assessment: 61yo male supratherapeutic on heparin after rate change; no infusion issues or signs of bleeding per RN.  Goal of Therapy:  aPTT 66-102 seconds   Plan:  Will decrease heparin infusion by ~1 unit/kg/hr to 1300 units/hr and check PTT in 8 hours.    Wynona Neat, PharmD, BCPS  09/06/2022,4:27 AM

## 2022-09-07 LAB — CBC WITH DIFFERENTIAL/PLATELET
Abs Immature Granulocytes: 0.4 10*3/uL — ABNORMAL HIGH (ref 0.00–0.07)
Basophils Absolute: 0 10*3/uL (ref 0.0–0.1)
Basophils Relative: 0 %
Eosinophils Absolute: 0 10*3/uL (ref 0.0–0.5)
Eosinophils Relative: 0 %
HCT: 31.2 % — ABNORMAL LOW (ref 39.0–52.0)
Hemoglobin: 10.9 g/dL — ABNORMAL LOW (ref 13.0–17.0)
Immature Granulocytes: 2 %
Lymphocytes Relative: 6 %
Lymphs Abs: 1 10*3/uL (ref 0.7–4.0)
MCH: 32.9 pg (ref 26.0–34.0)
MCHC: 34.9 g/dL (ref 30.0–36.0)
MCV: 94.3 fL (ref 80.0–100.0)
Monocytes Absolute: 2.1 10*3/uL — ABNORMAL HIGH (ref 0.1–1.0)
Monocytes Relative: 12 %
Neutro Abs: 13.8 10*3/uL — ABNORMAL HIGH (ref 1.7–7.7)
Neutrophils Relative %: 80 %
Platelets: 264 10*3/uL (ref 150–400)
RBC: 3.31 MIL/uL — ABNORMAL LOW (ref 4.22–5.81)
RDW: 16.1 % — ABNORMAL HIGH (ref 11.5–15.5)
WBC: 17.2 10*3/uL — ABNORMAL HIGH (ref 4.0–10.5)
nRBC: 0.2 % (ref 0.0–0.2)

## 2022-09-07 LAB — RENAL FUNCTION PANEL
Albumin: 2.8 g/dL — ABNORMAL LOW (ref 3.5–5.0)
Anion gap: 20 — ABNORMAL HIGH (ref 5–15)
BUN: 109 mg/dL — ABNORMAL HIGH (ref 6–20)
CO2: 19 mmol/L — ABNORMAL LOW (ref 22–32)
Calcium: 8.9 mg/dL (ref 8.9–10.3)
Chloride: 97 mmol/L — ABNORMAL LOW (ref 98–111)
Creatinine, Ser: 8.93 mg/dL — ABNORMAL HIGH (ref 0.61–1.24)
GFR, Estimated: 6 mL/min — ABNORMAL LOW (ref >60)
Glucose, Bld: 157 mg/dL — ABNORMAL HIGH (ref 70–99)
Phosphorus: 4.8 mg/dL — ABNORMAL HIGH (ref 2.5–4.6)
Potassium: 3.6 mmol/L (ref 3.5–5.1)
Sodium: 136 mmol/L (ref 135–145)

## 2022-09-07 LAB — MAGNESIUM: Magnesium: 3.1 mg/dL — ABNORMAL HIGH (ref 1.7–2.4)

## 2022-09-07 LAB — GLUCOSE, CAPILLARY
Glucose-Capillary: 112 mg/dL — ABNORMAL HIGH (ref 70–99)
Glucose-Capillary: 144 mg/dL — ABNORMAL HIGH (ref 70–99)
Glucose-Capillary: 159 mg/dL — ABNORMAL HIGH (ref 70–99)
Glucose-Capillary: 170 mg/dL — ABNORMAL HIGH (ref 70–99)
Glucose-Capillary: 149 mg/dL — ABNORMAL HIGH (ref 70–99)
Glucose-Capillary: 130 mg/dL — ABNORMAL HIGH (ref 70–99)

## 2022-09-07 LAB — APTT: aPTT: 75 seconds — ABNORMAL HIGH (ref 24–36)

## 2022-09-07 LAB — HEPARIN LEVEL (UNFRACTIONATED): Heparin Unfractionated: 0.73 IU/mL — ABNORMAL HIGH (ref 0.30–0.70)

## 2022-09-07 MED ORDER — HEPARIN SODIUM (PORCINE) 1000 UNIT/ML DIALYSIS
1000.0000 [IU] | INTRAMUSCULAR | Status: DC | PRN
  Administered 2022-09-07: 1000 [IU]
  Filled 2022-09-07: qty 1

## 2022-09-07 MED ORDER — AMIODARONE HCL IN DEXTROSE 360-4.14 MG/200ML-% IV SOLN
30.0000 mg/h | INTRAVENOUS | Status: DC
  Administered 2022-09-07 – 2022-09-09 (×4): 30 mg/h via INTRAVENOUS
  Filled 2022-09-07 (×4): qty 200

## 2022-09-07 MED ORDER — LIDOCAINE HCL (PF) 1 % IJ SOLN: 5.0000 mL | INTRAMUSCULAR | Status: DC | PRN

## 2022-09-07 MED ORDER — AMIODARONE HCL 200 MG PO TABS
400.0000 mg | ORAL_TABLET | Freq: Two times a day (BID) | ORAL | Status: DC
  Administered 2022-09-07 – 2022-09-08 (×3): 400 mg via ORAL
  Filled 2022-09-07 (×3): qty 2

## 2022-09-07 MED ORDER — LIDOCAINE-PRILOCAINE 2.5-2.5 % EX CREA: 1.0000 | TOPICAL_CREAM | CUTANEOUS | Status: DC | PRN

## 2022-09-07 MED ORDER — NYSTATIN 100000 UNIT/ML MT SUSP
5.0000 mL | Freq: Four times a day (QID) | OROMUCOSAL | Status: DC
  Administered 2022-09-07 – 2022-09-08 (×3): 500000 [IU] via ORAL
  Filled 2022-09-07 (×6): qty 5

## 2022-09-07 MED ORDER — MEXILETINE HCL 150 MG PO CAPS
300.0000 mg | ORAL_CAPSULE | Freq: Two times a day (BID) | ORAL | Status: DC
  Administered 2022-09-07 – 2022-09-10 (×7): 300 mg via ORAL
  Filled 2022-09-07 (×7): qty 2

## 2022-09-07 MED ORDER — SULFAMETHOXAZOLE-TRIMETHOPRIM 400-80 MG PO TABS
1.0000 | ORAL_TABLET | ORAL | Status: DC
  Administered 2022-09-08 – 2022-09-10 (×2): 1 via ORAL
  Filled 2022-09-07 (×2): qty 1

## 2022-09-07 MED ORDER — HEPARIN SODIUM (PORCINE) 1000 UNIT/ML DIALYSIS
2000.0000 [IU] | INTRAMUSCULAR | Status: DC | PRN
  Administered 2022-09-09: 2000 [IU] via INTRAVENOUS_CENTRAL

## 2022-09-07 MED ORDER — PENTAFLUOROPROP-TETRAFLUOROETH EX AERO: 1.0000 | INHALATION_SPRAY | CUTANEOUS | Status: DC | PRN

## 2022-09-07 MED ORDER — CARVEDILOL 6.25 MG PO TABS
6.2500 mg | ORAL_TABLET | Freq: Two times a day (BID) | ORAL | Status: DC
  Administered 2022-09-07 (×2): 6.25 mg via ORAL
  Filled 2022-09-07 (×2): qty 1

## 2022-09-07 MED ORDER — APIXABAN 5 MG PO TABS
5.0000 mg | ORAL_TABLET | Freq: Two times a day (BID) | ORAL | Status: DC
  Administered 2022-09-07 – 2022-09-10 (×7): 5 mg via ORAL
  Filled 2022-09-07 (×7): qty 1

## 2022-09-07 MED ORDER — ALTEPLASE 2 MG IJ SOLR
2.0000 mg | Freq: Once | INTRAMUSCULAR | Status: AC | PRN
  Administered 2022-09-07: 2 mg
  Filled 2022-09-07: qty 2

## 2022-09-07 MED ORDER — ANTICOAGULANT SODIUM CITRATE 4% (200MG/5ML) IV SOLN: 5.0000 mL | Status: DC | PRN

## 2022-09-07 NOTE — Progress Notes (Signed)
Patient in VT and ICD fired at 9:44 bringing him back to NSR. Patient alert and oriented during event. Ativan given for comfort. Oda Kilts PA notified and up to see patient. New orders received.

## 2022-09-07 NOTE — Consult Note (Signed)
Consultation Note Date: 09/07/2022   [name]  DOB: 1961-05-25  MRN: [id number]  Age / Sex: 61 y.o., male  PCP: Victor Pier, MD Referring Physician: Evans Lance, MD  Reason for Consultation: Establishing goals of care  HPI/Patient Profile: 61 y.o. male  with past medical history of *** admitted on 08/24/2022 with ***.   Victor Thompson is 61 year old with a history of VT, ESRD, CAD, ischemic CMP, and ESRD.  He had PCI in 2952, uncertain what vessel was involved. Echo 2/22: EF 25-30% with moderate LV dilation and normal RV.  He had VT arrest in 2015, has [employer] subcutaneous ICD.  [name] in 2/22.  LHC/RHC was done in 8/22, showing no significant CAD and stable hemodynamics.   Admitted with refractory VT complicated by shock.     Clinical Assessment and Goals of Care:   This NP Victor Thompson reviewed medical records, received report from team, assessed the patient and then meet at the patient's bedside  to discuss diagnosis, prognosis, GOC, EOL wishes disposition and options.   Concept of Palliative Care was introduced as specialized medical care for people and their families living with serious illness.  If focuses on providing relief from the symptoms and stress of a serious illness.  The goal is to improve quality of life for both the patient and the family.  Created space and opportunity for patient  and family to explore thoughts and feelings regarding current medical situation     A  discussion was had today regarding advanced directives.  Concepts specific to code status, artifical feeding and hydration, continued IV antibiotics and rehospitalization was had.  The difference between a aggressive medical intervention path  and a palliative comfort care path for this patient at this time was had.  Values and goals of care important to patient and family were attempted to  be elicited.   MOST form    Natural trajectory and expectations at EOL were discussed.  Questions and concerns addressed.  Patient  encouraged to call with questions or concerns.     PMT will continue to support holistically.             {Primary Decision WUXLK:44010}    SUMMARY OF RECOMMENDATIONS    Code Status/Advance Care Planning: Full code   Symptom Management:  ***  Palliative Prophylaxis:  {Palliative Prophylaxis:21015}  Additional Recommendations (Limitations, Scope, Preferences): {Recommended Scope and Preferences:21019}  Psycho-social/Spiritual:  Desire for further Chaplaincy support:{YES NO:22349} Additional Recommendations: {PAL SOCIAL:21064}  Prognosis:  {Palliative Care Prognosis:23504}  Discharge Planning: {Palliative dispostion:23505}      Primary Diagnoses: Present on Admission: **None**   I have reviewed the medical record, interviewed the patient and family, and examined the patient. The following aspects are pertinent.  Past Medical History:  Diagnosis Date   Acute on chronic systolic CHF (congestive heart failure) ([place]) 06/14/2020   Acute respiratory failure ([place]) 05/28/2022   AICD (automatic cardioverter/defibrillator) present    2015   Allergy  Asthma    no meds   Cardiac arrest [facility]) 2015   Chest pain 05/25/2022   CHF (congestive heart failure) (HCC)    Coronary artery disease    ESRD on hemodialysis (HCC)    ckd -stage 5   Hyperlipidemia    Hypertension    Myocardial infarction (HCC)    Obesity    Pneumonia    Shortness of breath dyspnea    Sleep apnea    USES CPAP   Wears glasses    Social History   Socioeconomic History   Marital status: Single    Spouse name: Not on file   Number of children: 3   Years of education: Not on file   Highest education level: Not on file  Occupational History   Occupation: retired  Tobacco Use   Smoking status: Never   Smokeless tobacco: Never  Vaping Use   Vaping Use:  Never used  Substance and Sexual Activity   Alcohol use: No   Drug use: No   Sexual activity: Yes  Other Topics Concern   Not on file  Social History Narrative   ** Merged History Encounter **       Social Determinants of Health   Financial Resource Strain: Not on file  Food Insecurity: No Food Insecurity (08/28/2022)   Hunger Vital Sign    Worried About Running Out of Food in the Last Year: Never true    Ran Out of Food in the Last Year: Never true  Transportation Needs: No Transportation Needs (08/28/2022)   PRAPARE - Hydrologist (Medical): No    Lack of Transportation (Non-Medical): No  Physical Activity: Insufficiently Active (08/20/2022)   Exercise Vital Sign    Days of Exercise per Week: 1 day    Minutes of Exercise per Session: 10 min  Stress: Not on file  Social Connections: Not on file   Family History  Problem Relation Age of Onset   Hypertension Mother    Liver disease Father    Colon cancer Neg Hx    Esophageal cancer Neg Hx    Rectal cancer Neg Hx    Stomach cancer Neg Hx    Scheduled Meds:  amiodarone  400 mg Oral BID   apixaban  5 mg Oral BID   atorvastatin  80 mg Oral QHS   carvedilol  6.25 mg Oral BID WC   Chlorhexidine Gluconate Cloth  6 each Topical Q0600   Chlorhexidine Gluconate Cloth  6 each Topical Q0600   Chlorhexidine Gluconate Cloth  6 each Topical Q0600   cholecalciferol  5,000 Units Oral Daily   melatonin  3 mg Oral QHS   methylPREDNISolone (SOLU-MEDROL) injection  40 mg Intravenous Q24H   mexiletine  300 mg Oral Q12H   multivitamin  1 tablet Oral QHS   nystatin  5 mL Oral QID   pantoprazole  40 mg Oral Daily   polyethylene glycol  17 g Oral Daily   scopolamine  1 patch Transdermal Q72H   sodium chloride flush  10-40 mL Intracatheter Q12H   [START ON 09/08/2022] sulfamethoxazole-trimethoprim  1 tablet Oral Once per day on Mon Wed Fri   Continuous Infusions:  sodium chloride Stopped (08/26/22 1922)    amiodarone 30 mg/hr (09/07/22 1305)   anticoagulant sodium citrate     anticoagulant sodium citrate     ceFEPime (MAXIPIME) IV Stopped (09/06/22 2148)   PRN Meds:.acetaminophen, albuterol, alteplase, alteplase, anticoagulant sodium citrate, anticoagulant sodium citrate, fentaNYL, heparin, heparin, [  START ON 09/08/2022] heparin, LORazepam, LORazepam, mouth rinse, pentafluoroprop-tetrafluoroeth, prochlorperazine, sodium chloride flush Medications Prior to Admission:  Prior to Admission medications   Medication Sig Start Date End Date Taking? Authorizing Provider  albuterol (PROVENTIL) (2.5 MG/3ML) 0.083% nebulizer solution TAKE 3 MLS (2.5 MG TOTAL) BY NEBULIZATION EVERY 6 (SIX) HOURS AS NEEDED FOR WHEEZING OR SHORTNESS OF BREATH. 12/30/21 12/30/22 Yes Victor Pier, MD  albuterol (VENTOLIN HFA) 108 (90 Base) MCG/ACT inhaler Inhale 2 puffs into the lungs every 4 (four) hours as needed for wheezing or shortness of breath. 10/01/19  Yes Victor Pier, MD  amiodarone (PACERONE) 200 MG tablet Take 2 tabs (400 mg) twice daily for 14 days (now through Sep 30). Then take 1 tab (200 mg) once daily (starting Oct 1) 08/21/22  Yes [name] T, PA-C  aspirin EC 81 MG tablet Take 1 tablet (81 mg total) by mouth daily. 01/22/16  Yes [name], Josalyn, MD  atorvastatin (LIPITOR) 80 MG tablet Take 1 tablet (80 mg total) by mouth at bedtime. 06/25/22 06/25/23 Yes Larey Dresser, MD  AURYXIA 1 GM 210 MG(Fe) tablet Take 1 tablet (210 mg total) by mouth 3 (three) times daily. 08/12/21  Yes Victor Pier, MD  calcium acetate (PHOSLO) 667 MG capsule Take 2 capsule by mouth three times a day with meals Patient taking differently: Take 1,334 mg by mouth 3 (three) times daily with meals. 06/09/21  Yes   carvedilol (COREG) 6.25 MG tablet Take 1 tablet (6.25 mg total) by mouth 2 (two) times daily. 08/21/22  Yes Weaver, Scott T, PA-C  cetirizine (ZYRTEC) 10 MG tablet Take 1 tablet (10 mg total) by mouth 2 (two) times  daily. Patient taking differently: Take 10 mg by mouth 2 (two) times daily as needed for allergies. 11/12/21  Yes Valentina Shaggy, MD  Cholecalciferol (VITAMIN D-3) 125 MCG (5000 UT) TABS Take 5,000 Units by mouth daily.   Yes [provider]  docusate sodium (COLACE) 100 MG capsule Take 1 capsule (100 mg total) by mouth 2 (two) times daily. 02/15/22  Yes Victor Pier, MD  EPINEPHrine 0.3 mg/0.3 mL IJ SOAJ injection Inject 0.3 mg into the muscle once as needed for up to 2 doses (if worsening tongue swelling, SOB, hypoxia, or other concerns for progressive anaphylaxis). 07/15/21  Yes Victor Pier, MD  ethyl chloride spray SPRAY A SMALL AMOUNT THREE TIMES A WEEK JUST PRIOR TO NEEDLE INSERTION Patient taking differently: Apply 1 Application topically 3 (three) times a week. 10/26/21  Yes Victor Pier, MD  fluticasone (FLONASE) 50 MCG/ACT nasal spray Place 1 spray into both nostrils as needed for allergies or rhinitis.   Yes [provider]  hydrOXYzine (ATARAX) 25 MG tablet Take 1-2 tablets (25-50 mg total) by mouth at bedtime as needed for anxiety/insomnia. 08/23/22  Yes Mayers, Cari S, PA-C  polyethylene glycol (MIRALAX) 17 g packet Take 17 g by mouth daily. Titrate up as needed 05/05/22  Yes Armbruster, Carlota Raspberry, MD  Rectal Protectant-Emollient (CALMOL-4) 76-10 % SUPP Use as directed once to twice daily Patient taking differently: Place 1 suppository rectally 2 (two) times daily as needed (constipation). Use as directed once to twice daily 06/18/22  Yes Victor Pier, MD  SYMBICORT 80-4.5 MCG/ACT inhaler INHALE TWO PUFFS INTO THE LUNGS TWICE DAILY (BULK) Patient taking differently: Inhale 2 puffs into the lungs in the morning and at bedtime. 03/10/22  Yes Victor Pier, MD  VELPHORO 500 MG chewable tablet Chew 1,000 mg  by mouth 3 (three) times daily. 08/06/22  Yes [provider]   Allergies  Allergen Reactions   Ace Inhibitors Swelling     Swelling of the tongue   Influenza Vaccines Hives and Swelling    SWELLING REACTION UNSPECIFIED    Ketorolac Swelling    SWELLING REACTION UNSPECIFIED    Lidocaine Swelling    TONGUE SWELLS   Lisinopril Swelling    TONGUE SWELLING Pt reported problem with a BP med which sounded like  lisinopril  But as of 09/19/06,pt had tolerated altace without problem   Penicillins Swelling    TONGUE SWELLS Has patient had a PCN reaction causing immediate rash, facial/tongue/throat swelling, SOB or lightheadedness with hypotension: Yes Has patient had a PCN reaction causing severe rash involving mucus membranes or skin necrosis: No Has patient had a PCN reaction that required hospitalization No Has patient had a PCN reaction occurring within the last 10 years: Yes If all of the above answers are "NO", then may proceed with Cephalosporin use.    Review of Systems  Physical Exam  Vital Signs: BP 139/78   Pulse 66   Temp 97.9 F (36.6 C)   Resp (!) 22   Ht '5\' 7"'$  (1.702 m)   Wt 115.9 kg   SpO2 94%   BMI 40.02 kg/m  Pain Scale: 0-10 POSS *See Group Information*: 2-Acceptable,Slightly drowsy, easily aroused Pain Score: 0-No pain   SpO2: SpO2: 94 % O2 Device:SpO2: 94 % O2 Flow Rate: .O2 Flow Rate (L/min): 0 L/min  IO: Intake/output summary:  Intake/Output Summary (Last 24 hours) at 09/07/2022 1351 Last data filed at 09/07/2022 0700 Gross per 24 hour  Intake 839.28 ml  Output 0 ml  Net 839.28 ml    LBM: Last BM Date : 09/06/22 Baseline Weight: Weight: 119 kg Most recent weight: Weight: 115.9 kg     Palliative Assessment/Data:     Time In: *** Time Out: *** Time Total: *** Greater than 50%  of this time was spent counseling and coordinating care related to the above assessment and plan.  Signed by: Victor Lessen, NP   Please contact Palliative Medicine Team phone at [phone] for questions and concerns.  For individual provider: See Shea Victor

## 2022-09-07 NOTE — Progress Notes (Signed)
Treatment completed:   Patient tolerated well.  Alert, without acute distress.  Hand-off given to patient's nurse.   Access used: Started with HD cath with no success, switched to R AVF Access issues: Ran at BFR 300   09/07/22 1917  Vitals  Temp 98.5 F (36.9 C)  Pulse Rate 73  Resp (!) 22  BP 114/76  SpO2 93 %  O2 Device Room Air  Weight 113.8 kg  Type of Weight Post-Dialysis  Oxygen Therapy  Patient Activity (if Appropriate) In bed  Pulse Oximetry Type Continuous  Post Treatment  Dialyzer Clearance Clear  Duration of HD Treatment -hour(s) 4 hour(s)  Liters Processed 74.1  Fluid Removed 2000 mL  Tolerated HD Treatment Yes  Post-Hemodialysis Comments HD IJ cath has Cath Flow instilled  AVG/AVF Arterial Site Held (minutes) 5 minutes  AVG/AVF Venous Site Held (minutes) 5 minutes      [facility]

## 2022-09-07 NOTE — Progress Notes (Signed)
Inpatient Rehab Admissions Coordinator:    Continuing to follow this patient. At this time he continues to not be medically ready/stable.   Rehab Admissons Coordinator [place], [place], [place] [phone]

## 2022-09-07 NOTE — Progress Notes (Signed)
[facility] Progress Note  Subjective: seen in room, no new c/o's  Vitals:   09/07/22 1445 09/07/22 1500 09/07/22 1515 09/07/22 1530  BP: 129/81 (!) 147/78 136/78 108/66  Pulse: 64 61 63 (!) 59  Resp: (!) 24 (!) 21 (!) 21 18  Temp:      TempSrc:      SpO2: 95% 96% 92% 91%  Weight:      Height:        Exam: Gen: sitting in bed, awake CVS: RRR Resp: shallow breaths  but clear anteriorly Abd: soft obese, nontender Ext: trace LE edema ACCESS: R AVF + T/B    OP HD: [place] [facility]  4h 67mn  450/ 1.5  116.4kg  AVF  15g  Hep 3000 - hectorol 7 ug iv tiw - no esa     CT chest 10/1, 10/2 - no signs of pulm edema, minimal GG changes in lung fields   Assessment/ Plan: VT storm / VF - pt rx'd w/ IV amio, po quinidine, sedation / intubation, paralytics and procainamide and improved. Then 9/24 had more VT/ shocks and was reintubated and restarted on IV amio w/ procainamide.  Procainamide stopped and mexilitine started 9/27, IV amio continued.  Not thought to be a VT ablation candidate.  Empiric pred 40 mg daily in case sarcoid is contributory (had restrictive pattern on TTE).  BP/volume: CVP 5-6, BPs relatively stable, slightly under dry wt ESRD - on HD TTS. SP CRRT 9/20- 9/22 and again from 9/25-9/29. Now back on iHD. HD today. Lowered UFG and BFR a bit.  HFrEF / EF 20-25%/ sp ICD Anemia esrd - Hb 10s, follow and give ESA if needed MBD ckd - CCa in range, phos a bit high.  Cont auryxia 1 ac tid for now (usual dose is 2 ac). Phos is 4-5 range.  Afib: on Eliquis  Rob Jonna Dittrich 09/07/2022, 3:35 PM   Recent Labs  Lab 09/06/22 0354 09/06/22 0454 09/07/22 0450 09/07/22 0800  HGB 10.5*  --  10.9*  --   ALBUMIN 2.8* 2.8*  --  2.8*  CALCIUM 9.2 8.8*  --  8.9  PHOS <1.0* 5.2*  --  4.8*  CREATININE 3.51* 6.22*  --  8.93*  K 3.9 3.7  --  3.6    No results for input(s): "IRON", "TIBC", "FERRITIN" in the last 168 hours. Inpatient medications:  amiodarone  400 mg Oral BID    apixaban  5 mg Oral BID   atorvastatin  80 mg Oral QHS   carvedilol  6.25 mg Oral BID WC   Chlorhexidine Gluconate Cloth  6 each Topical Q0600   Chlorhexidine Gluconate Cloth  6 each Topical Q0600   Chlorhexidine Gluconate Cloth  6 each Topical Q0600   cholecalciferol  5,000 Units Oral Daily   melatonin  3 mg Oral QHS   methylPREDNISolone (SOLU-MEDROL) injection  40 mg Intravenous Q24H   mexiletine  300 mg Oral Q12H   multivitamin  1 tablet Oral QHS   nystatin  5 mL Oral QID   pantoprazole  40 mg Oral Daily   polyethylene glycol  17 g Oral Daily   scopolamine  1 patch Transdermal Q72H   sodium chloride flush  10-40 mL Intracatheter Q12H   [START ON 09/08/2022] sulfamethoxazole-trimethoprim  1 tablet Oral Once per day on Mon Wed Fri    sodium chloride Stopped (08/26/22 1922)   amiodarone 30 mg/hr (09/07/22 1400)   anticoagulant sodium citrate     anticoagulant sodium citrate  ceFEPime (MAXIPIME) IV Stopped (09/06/22 2148)   acetaminophen, albuterol, alteplase, alteplase, anticoagulant sodium citrate, anticoagulant sodium citrate, fentaNYL, heparin, heparin, [START ON 09/08/2022] heparin, LORazepam, LORazepam, mouth rinse, pentafluoroprop-tetrafluoroeth, prochlorperazine, sodium chloride flush

## 2022-09-07 NOTE — Progress Notes (Signed)
Speech Language Pathology Treatment: Dysphagia  Patient Details Name: Victor Thompson MRN: [id number] DOB: [birth year] Today's Date: 09/07/2022 Time:  -     Assessment / Plan / Recommendation Clinical Impression  F/u after initial clinical swallow assessment - pt is doing very well with swallowing/eating. Intake has been good. Today he was sitting in recliner with family present. He demonstrated fully resolved dysphagia c/b thorough mastication, the appearance of a brisk swallow response, and no s/s of aspiration.  Voice is strong. No further deficits identified. Continue regular solids/thin liquids; no SLP f/u is needed.    HPI HPI: Pt is a 61 y.o. male admitted from HD on 08/24/22 with c/o palpitations, feeling faint. In ED, recurrent episodes of VT with repeated ICD discharges. ETT 9/19-9/22. R/LHC on 9/20. CRRT 9/20-9/22, restarted 9/26. 9/24 VT and reintubated. extubated 9/28. PMH includes CHF, AICD, CHF, CAD, ESRD (on HD), HTN, MI, HLD, OSA (uses CPAP), obesity, asthma.      SLP Plan  All goals met      Recommendations for follow up therapy are one component of a multi-disciplinary discharge planning process, led by the attending physician.  Recommendations may be updated based on patient status, additional functional criteria and insurance authorization.    Recommendations  Diet recommendations: Regular;Thin liquid Liquids provided via: Straw Medication Administration: Whole meds with liquid Supervision: Patient able to self feed                Oral Care Recommendations: Oral care BID Follow Up Recommendations: No SLP follow up SLP Visit Diagnosis: Dysphagia, unspecified (R13.10) Plan: All goals met         Vernella Niznik L. Tivis Ringer, MA CCC/SLP Clinical Specialist - Acute Care SLP Acute Rehabilitation Services Office number [phone]   Juan Quam Laurice  09/07/2022, 2:10 PM

## 2022-09-07 NOTE — Progress Notes (Signed)
Bedside Alert and oriented.  Informed consent signed and in chart.   Treatment initiated:    09/07/22 1312  Vitals  Temp 97.9 F (36.6 C)  Pulse Rate 73  Resp (!) 21  BP (!) 158/81  SpO2 96 %  O2 Device Room Air  Weight 115.9 kg  Oxygen Therapy  Patient Activity (if Appropriate) In bed  Pulse Oximetry Type Continuous  Pre Treatment  Is pt a NEW START this admission?  No  Vascular access used during treatment Catheter (Pt states his fistula doesn't work well/alarms the machine)  HD catheter dressing before treatment WDL  Patient is receiving dialysis in a chair No  Hemodialysis Consent Verified Yes  Hemodialysis Standing Orders Initiated Yes  ECG (Telemetry) Monitor On Yes  Prime Ordered Normal Saline  Length of  DialysisTreatment -hour(s) 4 Hour(s)  Dialysis mode HD  Dialyzer Revaclear 400  Dialysate 3K;2.5 Ca  Dialysis Anticoagulation Automated NS Flushes  Dialysate Flow Ordered 300  Blood Flow Rate Ordered 400 mL/min  Ultrafiltration Goal 2000 Liters  Blood Pressure Parameters Keep SBP >105  Dialysis Blood Pressure Support Ordered Normal Saline      [facility]

## 2022-09-07 NOTE — Progress Notes (Signed)
HD cath not running and AP increased, R AVF cannulated and continued tx.

## 2022-09-07 NOTE — Progress Notes (Addendum)
Electrophysiology Rounding Note  [name] Date of Encounter: 09/07/2022  Primary Cardiologist: Loralie Champagne, MD Electrophysiologist: Cristopher Peru, MD   Subjective   NAEON. Feeling well this AM, denies nausea, good appetite. Agreeable to go to rehab so that he can climb stairs to be able to sleep in bed at home   Inpatient Medications    Scheduled Meds:  amiodarone  400 mg Oral BID   apixaban  5 mg Oral BID   atorvastatin  80 mg Oral QHS   carvedilol  6.25 mg Oral BID WC   Chlorhexidine Gluconate Cloth  6 each Topical Q0600   Chlorhexidine Gluconate Cloth  6 each Topical Q0600   Chlorhexidine Gluconate Cloth  6 each Topical Q0600   cholecalciferol  5,000 Units Oral Daily   melatonin  3 mg Oral QHS   methylPREDNISolone (SOLU-MEDROL) injection  40 mg Intravenous Q24H   mexiletine  200 mg Oral BID   multivitamin  1 tablet Oral QHS   nystatin  5 mL Oral QID   pantoprazole  40 mg Oral Daily   polyethylene glycol  17 g Oral Daily   scopolamine  1 patch Transdermal Q72H   sodium chloride flush  10-40 mL Intracatheter Q12H   [START ON 09/08/2022] sulfamethoxazole-trimethoprim  1 tablet Oral Once per day on Mon Wed Fri   Continuous Infusions:  sodium chloride Stopped (08/26/22 1922)   anticoagulant sodium citrate     ceFEPime (MAXIPIME) IV Stopped (09/06/22 2148)   PRN Meds: acetaminophen, albuterol, alteplase, anticoagulant sodium citrate, fentaNYL, heparin, LORazepam, LORazepam, mouth rinse, prochlorperazine, sodium chloride flush   Vital Signs    Vitals:   09/07/22 0458 09/07/22 0500 09/07/22 0600 09/07/22 0700  BP:  131/80 119/88 (!) 137/92  Pulse:  (!) 59 61 68  Resp:  (!) 21 19 (!) 22  Temp:      TempSrc:      SpO2:  94% 99% 94%  Weight: 116 kg     Height:        Intake/Output Summary (Last 24 hours) at 09/07/2022 0854 Last data filed at 09/07/2022 0700 Gross per 24 hour  Intake 1376.33 ml  Output 0 ml  Net 1376.33 ml    Filed Weights    09/05/22 1850 09/06/22 0414 09/07/22 0458  Weight: 115 kg 112.9 kg 116 kg    Physical Exam    GEN- much more awake and conversational HEENT- benign Lungs- CTA Heart- RRR, no murmurs, rubs or gallops GI- soft, +BS Extremities- no clubbing or cyanosis. trace edema Skin- no rash or lesion Neuro- answers questions appropriately, moves all extremities independently  Labs    CBC Recent Labs    09/06/22 0354 09/07/22 0450  WBC 15.9* 17.2*  NEUTROABS 13.1* 13.8*  HGB 10.5* 10.9*  HCT 31.3* 31.2*  MCV 96.3 94.3  PLT 228 932    Basic Metabolic Panel Recent Labs    09/06/22 0354 09/06/22 0454  NA 137 138  K 3.9 3.7  CL 96* 97*  CO2 24 23  GLUCOSE 21* 109*  BUN 76* 76*  CREATININE 3.51* 6.22*  CALCIUM 9.2 8.8*  PHOS <1.0* 5.2*    Liver Function Tests Recent Labs    09/06/22 0354 09/06/22 0454  ALBUMIN 2.8* 2.8*    No results for input(s): "LIPASE", "AMYLASE" in the last 72 hours. Cardiac Enzymes No results for input(s): "CKTOTAL", "CKMB", "CKMBINDEX", "TROPONINI" in the last 72 hours.   Telemetry    SR  Radiology  10/2 HR Chest CT 1. No findings to suggest pulmonary sarcoidosis. 2. Linear opacities of the bilateral lower lobes and posterior left upper lobe with mild bilateral ground-glass opacities, ground-glass opacities appear slightly decreased when compared with prior exam. Favor resolving sequela of infection or aspiration. 3. Stable mildly enlarged mediastinal lymph nodes, likely reactive. 4. Severe left main and three-vessel coronary artery calcifications. 5. Aortic Atherosclerosis (ICD10-I70.0) and Emphysema (ICD10-J43.9).  10/1 Chest CT wo There are patchy infiltrates in both lower lung fields suggesting atelectasis/pneumonia. There are slightly enlarged lymph nodes in mediastinum, possibly reactive hyperplasia of lymph nodes. 2. Cardiomegaly.  Coronary artery calcifications are seen. 3. 2.2 cm smooth marginated soft tissue density  structure in the inferior margin of right adrenal most likely is partial volume averaging of upper pole of right kidney. Less likely possibility would be a nodule arising from the right adrenal. Follow-up CT abdomen may be considered for further evaluation. 4. Cervical spondylosis with encroachment of neural foramina in the visualized lower cervical spine.  08/25/22: R/LHC Left Anterior Descending  Ost LAD to Prox LAD lesion is 40% stenosed.    First Diagonal Branch  1st Diag lesion is 60% stenosed.    Left Circumflex  Mid Cx lesion is 30% stenosed.    Right Coronary Artery  Prox RCA lesion is 30% stenosed.     Right Heart Pressures RHC Procedural Findings: Hemodynamics (mmHg) RA mean 14 RV 52/15 PA 53/27, mean 37 PCWP mean 26 LV 97/29 AO 103/64  Oxygen saturations: PA 76% AO 100%  Cardiac Output (Fick) 8.98  Cardiac Index (Fick) 3.96 PVR 2.8 WU    08/25/2022: TTE  1. Left ventricular ejection fraction, by estimation, is 20 to 25%. The  left ventricle has severely decreased function. The left ventricle  demonstrates global hypokinesis. The left ventricular internal cavity size  was severely dilated. Left ventricular  diastolic parameters are consistent with Grade III diastolic dysfunction  (restrictive). Elevated left ventricular end-diastolic pressure.   2. Right ventricular systolic function is normal. The right ventricular  size is mildly enlarged. There is mildly elevated pulmonary artery  systolic pressure.   3. Left atrial size was moderately dilated.   4. Right atrial size was mildly dilated.   5. The mitral valve is normal in structure. Mild to moderate mitral valve  regurgitation. No evidence of mitral stenosis.   6. The aortic valve is grossly normal. Aortic valve regurgitation is not  visualized. No aortic stenosis is present.   7. The inferior vena cava is dilated in size with <50% respiratory  variability, suggesting right atrial pressure of 15  mmHg.   8. Cannot exclude a small PFO.   Comparison(s): Changes from prior study are noted. LV chamber now severely  dilated (7 cm).    10/21/21: TTE  1. Left ventricular ejection fraction, by estimation, is 25 to 30%. The  left ventricle has severely decreased function. The left ventricle  demonstrates global hypokinesis. The left ventricular internal cavity size  was moderately dilated. Left  ventricular diastolic parameters are consistent with Grade I diastolic  dysfunction (impaired relaxation).   2. Right ventricular systolic function is normal. The right ventricular  size is normal. There is normal pulmonary artery systolic pressure. The  estimated right ventricular systolic pressure is 31.5 mmHg.   3. The mitral valve is normal in structure. Mild mitral valve  regurgitation. No evidence of mitral stenosis.   4. The aortic valve is tricuspid. Aortic valve regurgitation is not  visualized. No aortic stenosis  is present.   5. The inferior vena cava is normal in size with greater than 50%  respiratory variability, suggesting right atrial pressure of 3 mmHg.    07/08/21; LHC 1. Normal filling pressures and normal cardiac output.  2. No significant CAD.  Nonischemic cardiomyopathy.   Patient Profile     Victor Thompson is a 61 y.o. male with a hx of  hx of VF arrest (2015), CAD (PCI in 2005, unknown details), ICM,. Chronic CHF (systolic), ESRF on HD , OSA w/CPAP, HTN, HLD, recurrent angio edema who was admitted for another VT storm.    Assessment & Plan    1.  VT storm / refractory VT Was shocked 9/24 by both sICD and external defib. Doing well -- minimal, brief NSVT but having frequent atrial and ventricular ectopy. SICD Battery ~16% as of 10/2 - heparin gtt to be stopped, resume eliquis - stop Amio gtt, start '400mg'$  PO BID  - continue coreg 6.25,Consider switching to metoprolol XL  - tolerating mexilitine without angioedema - Failed quinidine   2. Chronic systolic CHF AHF  team is on board -Coronary angiography in 07/2021 showed no significant CAD, and also did not appear to show prior "stent" which had been reported.  -R/LHC 08/25/2022 with Moderate non obstructive CAD and elevated filling pressures as above.  -PYP scan negative  -Had been planned for PET as outpatient for possible Sarcoid.  -ECG with IVCD 150 msec, thought not to be good candidate for CRT upgrade (not true LBBB) -Currently off pressors -Started on steroids if perhaps/possible sarcoid playing a role  3. ESRD Nephrology following.  Back on iHD   4. Acute hypoxemic respiratory failure Extubated 9/28 Appreciate HF and CCM care  5. AFib/flutter  Most tele events appear to be atrial extopy and flutter (eg 9/30 06:42:44 "Pacer not pacing" event shows flutter.   For questions or updates, please contact [place] Please consult [email or web address] for contact info under Cardiology/STEMI.  Signed, Mamie Levers, NP  09/07/2022, 8:54 AM  EP Attending  Patient seen and examined. Agree with the findings as noted above. The patient has been stable but a few hours after my round this morning had more VT and an ICD shock. He will be continued on the amio both po and IV.   Carleene Overlie [place],MD

## 2022-09-07 NOTE — Progress Notes (Signed)
TMP alarm and new set up needed. Reset the machine and Venous needle redone  by Tech.

## 2022-09-07 NOTE — Progress Notes (Signed)
Nutrition Follow-up  DOCUMENTATION CODES:   Not applicable  INTERVENTION:  - No interventions at this time.    NUTRITION DIAGNOSIS:   Inadequate oral intake related to acute illness as evidenced by NPO status. - Resolved, diet advanced and eating 100%.   GOAL:   Patient will meet greater than or equal to 90% of their needs - Met  MONITOR:   Vent status, Labs, Weight trends, TF tolerance, Skin, I & O's  REASON FOR ASSESSMENT:   Ventilator    ASSESSMENT:   61 yo male admitted with recurrent VT storm with multiple ICD shocks requiring intubation. PMH includes ESRD on HD, CHF, CAD, HTN, HLD, ICD  Meds reviewed: Vit D3, Rena-vit, Miralax. Labs reviewed: Phos high, Mag high.   The pt is now extubated and diet advanced. Pt states that he is tolerating diet well with no issues. Pt states that he was also eating well PTA. Pt states that he was not following a diet PTA. Pt was receiving HD at time of assessment. RD brought diet education handouts by, however pt seemed very uncomfortable during dialysis and could likely benefit from further education prior to discharge. RD briefly reviewed the packets.   NUTRITION - FOCUSED PHYSICAL EXAM:  Flowsheet Row Most Recent Value  Orbital Region No depletion  Upper Arm Region No depletion  Thoracic and Lumbar Region No depletion  Buccal Region No depletion  Temple Region No depletion  Clavicle Bone Region No depletion  Clavicle and Acromion Bone Region No depletion  Scapular Bone Region No depletion  Dorsal Hand No depletion  Patellar Region No depletion  Anterior Thigh Region No depletion  Posterior Calf Region No depletion  Edema (RD Assessment) None  Hair Reviewed  Eyes Reviewed  Mouth Reviewed  Skin Reviewed  Nails Reviewed       Diet Order:   Diet Order             Diet Heart Room service appropriate? Yes; Fluid consistency: Thin; Fluid restriction: 1500 mL Fluid  Diet effective now                    EDUCATION NEEDS:   Not appropriate for education at this time  Skin:  Skin Assessment: Reviewed RN Assessment  Last BM:  10/1 after prolonged period of constipation  Height:   Ht Readings from Last 1 Encounters:  08/29/22 _0  (1.702 m)    Weight:   Wt Readings from Last 1 Encounters:  09/07/22 115.9 kg    Ideal Body Weight:  67.3 kg  BMI:  Body mass index is 40.02 kg/m.  Estimated Nutritional Needs:   Kcal:  1850-2150 kcals  Protein:  100-135 g  Fluid:  1000 mL plus UOP  Thalia Bloodgood, RD, LDN, CNSC

## 2022-09-07 NOTE — Progress Notes (Signed)
Paged for ICD therapy.   Pt received shock for VT ~181 bpm at approx 0944.   Discussed with Dr. Lovena Le.   Increase mexitil to 300 mg BID Continue IV amiodarone AND po amiodarone for now.   Discussed with patient that his options for escalating care are very limited, with no real durable option. Had previously discussed VT ablation, but in discussion with EP team it was felt to be prohibitive risk given very low likelihood of success.    Pt is agreeable to palliative care discussions. Consult has been placed.   Keep in ICU at least today.   Legrand Como [address] [place], PA-C  09/07/2022 10:50 AM

## 2022-09-07 NOTE — Progress Notes (Signed)
ANTICOAGULATION CONSULT NOTE - Follow Up Consult  Pharmacy Consult for heparin Indication: atrial fibrillation  Labs: Recent Labs    09/04/22 0411 09/04/22 0520 09/04/22 1122 09/04/22 1122 09/04/22 2036 09/05/22 0332 09/06/22 0354 09/06/22 0454 09/06/22 1149 09/06/22 2205  HGB 10.2*   < > 11.2*  --   --  10.4* 10.5*  --   --   --   HCT 31.1*   < > 33.0*  --   --  30.5* 31.3*  --   --   --   PLT 185  --   --   --   --  221 228  --   --   --   APTT  --   --   --    < > 58* 62* 136*  --  117* 82*  HEPARINUNFRC  --   --   --   --  >1.10* >1.10* >1.10*  --   --   --   CREATININE 2.96*  --   --   --   --  5.57* 3.51* 6.22*  --   --    < > = values in this interval not displayed.    Assessment/Plan:  61yo male therapeutic on heparin after rate changes. Will continue infusion at current rate of 1100 units/hr and confirm stable with am labs.   Wynona Neat, PharmD, BCPS  09/07/2022,12:42 AM

## 2022-09-07 NOTE — Progress Notes (Signed)
OT Cancellation Note  Patient Details Name: KENSHIN SPLAWN MRN: [id number] DOB: 12-06-1961   Cancelled Treatment:    Reason Eval/Treat Not Completed: Other (comment) (attempted again. Pt now on HD. will follow up another date.)  [facility] 09/07/2022, 3:01 PM Maurie Boettcher, OT/L   Acute OT Clinical Specialist [place] Pager [phone] Office [phone]

## 2022-09-07 NOTE — Progress Notes (Addendum)
Patient ID: Victor Thompson, male   DOB: 10-14-1961, 61 y.o.   MRN: [id number]     Advanced Heart Failure Rounding Note  PCP-Cardiologist: Loralie Champagne, MD   Subjective:    Admitted with VT storm.   9/24 multiple repeated shocks for VT monomorphic, reintubated, rebolused with amio, quinidine now on hold, started on procainamide.   9/25 Given concern for possible cardiac sarcoid, started empirically on prednisone. IV procainamide discontinued by EP  9/27 started on ceftriaxone for suspected aspiration PNA. Mexiletine started for VT suppression   9/28 Extubated  Added renal function panel on to AM labs  He is on cefepime (end date 10/03) for aspiration PNA.  WBCs 17=>16=>17=>16=>17K, afebrile. Trach aspirate with MSSA and Morganella.   iHD on 10/01. CVP 5-6. Planning for iHD today.  NGT removed yesterday. Tolerating regular diet.    HiRes Chest CT (10/02):  No findings to suggest pulmonary sarcoidosis, linear opacities b/l LL and posterior LUL with mild ground-glass opacities (favor resolving infection or aspiration)  RHC/LHC (9/20):  Coronary Findings  Diagnostic Dominance: Right Left Anterior Descending  Ost LAD to Prox LAD lesion is 40% stenosed.    First Diagonal Branch  1st Diag lesion is 60% stenosed.    Left Circumflex  Mid Cx lesion is 30% stenosed.    Right Coronary Artery  Prox RCA lesion is 30% stenosed.    Intervention   No interventions have been documented.   Right Heart  Right Heart Pressures RHC Procedural Findings: Hemodynamics (mmHg) RA mean 14 RV 52/15 PA 53/27, mean 37 PCWP mean 26 LV 97/29 AO 103/64  Oxygen saturations: PA 76% AO 100%  Cardiac Output (Fick) 8.98  Cardiac Index (Fick) 3.96 PVR 2.8 WU    Objective:   Weight Range: 116 kg Body mass index is 40.05 kg/m.   Vital Signs:   Temp:  [97.5 F (36.4 C)-97.9 F (36.6 C)] 97.5 F (36.4 C) (10/03 0400) Pulse Rate:  [59-80] 68 (10/03 0700) Resp:  [16-25] 22 (10/03  0700) BP: (107-139)/(63-112) 137/92 (10/03 0700) SpO2:  [61 %-100 %] 94 % (10/03 0700) Arterial Line BP: (99-185)/(45-81) 175/81 (10/03 0700) Weight:  [546 kg] 116 kg (10/03 0458) Last BM Date : 09/06/22  Weight change: Filed Weights   09/05/22 1850 09/06/22 0414 09/07/22 0458  Weight: 115 kg 112.9 kg 116 kg    Intake/Output:   Intake/Output Summary (Last 24 hours) at 09/07/2022 0702 Last data filed at 09/07/2022 5681 Gross per 24 hour  Intake 1361.05 ml  Output 0 ml  Net 1361.05 ml      Physical Exam   General: Sitting up in bed. No distress. HEENT: normal Neck: supple. no JVD. Carotids 2+ bilat; no bruits. L IJ HD cath Cor: PMI nondisplaced. Regular rate & rhythm. No rubs, gallops or murmurs.  Lungs: clear Abdomen: soft, nontender, nondistended.  Extremities: no cyanosis, clubbing, rash, edema Neuro: alert & orientedx3, cranial nerves grossly intact. moves all 4 extremities w/o difficulty. Affect pleasant     Telemetry   SR 60s  EKG    N/A  Labs    CBC Recent Labs    09/06/22 0354 09/07/22 0450  WBC 15.9* 17.2*  NEUTROABS 13.1* 13.8*  HGB 10.5* 10.9*  HCT 31.3* 31.2*  MCV 96.3 94.3  PLT 228 275   Basic Metabolic Panel Recent Labs    09/06/22 0354 09/06/22 0454  NA 137 138  K 3.9 3.7  CL 96* 97*  CO2 24 23  GLUCOSE 21* 109*  BUN  76* 76*  CREATININE 3.51* 6.22*  CALCIUM 9.2 8.8*  PHOS <1.0* 5.2*   Liver Function Tests Recent Labs    09/06/22 0354 09/06/22 0454  ALBUMIN 2.8* 2.8*   No results for input(s): "LIPASE", "AMYLASE" in the last 72 hours. Cardiac Enzymes No results for input(s): "CKTOTAL", "CKMB", "CKMBINDEX", "TROPONINI" in the last 72 hours.  BNP: BNP (last 3 results) Recent Labs    05/25/22 0705 08/16/22 0627 08/16/22 1257  BNP 1,355.4* QUANTITY NOT SUFFICIENT, UNABLE TO PERFORM TEST 399.2*    ProBNP (last 3 results) No results for input(s): "PROBNP" in the last 8760 hours.   D-Dimer No results for input(s):  "DDIMER" in the last 72 hours. Hemoglobin A1C No results for input(s): "HGBA1C" in the last 72 hours. Fasting Lipid Panel No results for input(s): "CHOL", "HDL", "LDLCALC", "TRIG", "CHOLHDL", "LDLDIRECT" in the last 72 hours.  Thyroid Function Tests No results for input(s): "TSH", "T4TOTAL", "T3FREE", "THYROIDAB" in the last 72 hours.  Invalid input(s): "FREET3"   Other results:   Imaging    CT Chest High Resolution  Result Date: 09/06/2022 CLINICAL DATA:  Evaluate for pulmonary sarcoid EXAM: CT CHEST WITHOUT CONTRAST TECHNIQUE: Multidetector CT imaging of the chest was performed following the standard protocol without intravenous contrast. High resolution imaging of the lungs, as well as inspiratory and expiratory imaging, was performed. RADIATION DOSE REDUCTION: This exam was performed according to the departmental dose-optimization program which includes automated exposure control, adjustment of the mA and/or kV according to patient size and/or use of iterative reconstruction technique. COMPARISON:  Chest CT dated September 05, 2022 FINDINGS: Cardiovascular: Normal heart size. No pericardial effusion. Severe left main and three-vessel coronary artery calcifications. Normal caliber thoracic aorta with mild calcified plaque. Mediastinum/Nodes: Esophagus and thyroid are unremarkable. Unchanged mildly enlarged mediastinal lymph nodes, reference right upper paratracheal lymph node measuring 1.1 cm in short axis on series 5, image 38, likely reactive. Lungs/Pleura: Mild scattered linear tracheal and right mainstem bronchus debris. Central airways are patent. No evidence of air trapping. Mild centrilobular emphysema. Linear opacities of the bilateral lower lobes and posterior left upper lobe and mild bilateral ground-glass opacities, ground-glass opacities appear slightly decreased when compared with prior exam. No evidence of architectural distortion, traction bronchiectasis or honeycomb change. Upper  Abdomen: Previously described right adrenal gland soft tissue lesion is not included in the field of view. No acute abnormality. Musculoskeletal: Subcutaneous left chest wall ICD. No chest wall mass or suspicious bone lesions identified. IMPRESSION: 1. No findings to suggest pulmonary sarcoidosis. 2. Linear opacities of the bilateral lower lobes and posterior left upper lobe with mild bilateral ground-glass opacities, ground-glass opacities appear slightly decreased when compared with prior exam. Favor resolving sequela of infection or aspiration. 3. Stable mildly enlarged mediastinal lymph nodes, likely reactive. 4. Severe left main and three-vessel coronary artery calcifications. 5. Aortic Atherosclerosis (ICD10-I70.0) and Emphysema (ICD10-J43.9). Electronically Signed   By: Yetta Glassman M.D.   On: 09/06/2022 15:39     Medications:     Scheduled Medications:  amiodarone  150 mg Intravenous Once   atorvastatin  80 mg Oral QHS   carvedilol  3.125 mg Oral BID WC   Chlorhexidine Gluconate Cloth  6 each Topical Q0600   Chlorhexidine Gluconate Cloth  6 each Topical Q0600   Chlorhexidine Gluconate Cloth  6 each Topical Q0600   cholecalciferol  5,000 Units Oral Daily   melatonin  3 mg Oral QHS   methylPREDNISolone (SOLU-MEDROL) injection  40 mg Intravenous Q24H   mexiletine  200 mg Oral BID   multivitamin  1 tablet Oral QHS   pantoprazole  40 mg Oral Daily   polyethylene glycol  17 g Oral Daily   scopolamine  1 patch Transdermal Q72H   sodium chloride flush  10-40 mL Intracatheter Q12H    Infusions:  sodium chloride Stopped (08/26/22 1922)   amiodarone 30 mg/hr (09/07/22 2725)   anticoagulant sodium citrate     ceFEPime (MAXIPIME) IV Stopped (09/06/22 2148)   heparin 1,100 Units/hr (09/07/22 0627)    PRN Medications: acetaminophen, albuterol, alteplase, anticoagulant sodium citrate, fentaNYL, heparin, LORazepam, LORazepam, mouth rinse, prochlorperazine, sodium chloride  flush    Patient Profile   Mr Soules is 61 year old with a history of VT, ESRD, CAD, ischemic CMP, and ESRD. He had PCI in 3664, uncertain what vessel was involved. Echo 2/22: EF 25-30% with moderate LV dilation and normal RV.  He had VT arrest in 2015, has [employer] subcutaneous ICD.  [name] in 2/22.  LHC/RHC was done in 8/22, showing no significant CAD and stable hemodynamics.   Admitted with refractory VT complicated by shock.    Assessment/Plan   1. VT storm: Has has a Animator ICD.  Refractory monomorphic VT. Multiple shocks.  Later alternating NSR with a slow wide complex AIVR-like rhythm. Recurrent monomorphic VT 9/24 w/ repeated shocks>>re-intubated, rebolused with amio, quinidine discontinued, started on procainamide, now off.  Given concern for possible cardiac sarcoid, started empirically on prednisone, currently on 40 mg daily. HiRes Chest CT this admit with no evidence of pulmonary sarcoid. Cath this admit with nonobstructive CAD. Echo EF 20-25%, GIIIDD (restrictive), RV normal. No further VT, remains on amiodarone gtt 30 mg/hr + mexiletine.  - EP has decided against VT ablation, do not think it would be likely to be successful and high risk.  - Switch amio gtt to po amio 400 BID, discussed with EP - Mexiletine 200 mg bid added for suppression, continue. Tolerating ok, no signs of angioedema and he has been able to keep this down when given.   - Coreg 3.125 mg bid as long as BP stable.  - Long term very concerning, not candidate for LVAD with ESRD.   - Continue empiric prednisone 40 mg daily  - Would like eventual cardiac PET for sarcoidosis but can't do at this center yet.  Will need to be at [facility] after discharge.  2. Acute on chronic systolic CHF with cardiogenic shock: In setting of incessant VT.  H/o NICM, echo this admission with EF 20-25%, normal RV function, mild-moderate MR, IVC dilated.  At last admission, unable to complete cardiac MRI due to artifact from [facility]  ICD.  PYP scan was negative.  RHC this admit showed volume overload>>CRRT for volume removal.  Off CRRT. Off pressors.   - iHD on 10/01. CVP 5-6. Next HD planned for today.  - Will try to continue low dose Coreg.   3. CAD: ?PCI in 2005, unsure what vessel.  Coronary angiography in 8/22 showed no significant CAD and also does not appear to show prior stent so h/o CAD is questionable. LHC this admission with nonobstructive CAD.  - Continue statin. - No ASA with anticoagulation.  4. ESRD: Since 2/22. TTS HD.  - Now back on iHD, first session 10/01.  - Nephrology following. 5. ID: Suspect aspiration PNA.  Chest CT suspicious for this as well. Growing MSSA and Morganella from sputum.  Afebrile with WBCs 17.  - Continue cefepime (end date  today).   6. Atrial fibrillation: Paroxysmal, noted this admission. Has also had AFL.  Back to NSR this morning.   - switching IV amio to po amio 400 BID (dosing discussed with EP) - convert heparin gtt to po eliquis 7. Nausea/vomiting: Cause uncertain.  Not clearly associated with a medication.  KUB unremarkable.  Resolved. NGT removed. Now tolerating diet.  Mobilize. PT/OT recommending SNF for rehab. CIR following for possible admission pending insurance auth.   FINCH, LINDSAY N 09/07/2022 7:02 AM  Patient seen with PA, agree with the above note.   He remains in NSR this morning.  CVP around 6 with respiratory variation.  BP elevated.  Awake/alert, no more nausea, eating well.   General: NAD Neck: Thick. No JVD, no thyromegaly or thyroid nodule.  Lungs: Clear to auscultation bilaterally with normal respiratory effort. CV: Nondisplaced PMI.  Heart regular S1/S2, no S3/S4, no murmur.  No peripheral edema.  Abdomen: Soft, nontender, no hepatosplenomegaly, no distention.  Skin: Intact without lesions or rashes.  Neurologic: Alert and oriented x 3.  Psych: Normal affect. Extremities: No clubbing or cyanosis.  HEENT: Normal.   No further VT. Transition  amiodarone to po today and continue mexiletine.  BP elevated, can increase Coreg to 6.25 mg bid.   He remains on apixaban for atrial fibrillation.   He also remains on prednisone for ?cardiac sarcoidosis.  CT chest was not suggestive of pulmonary sarcoidosis.  We are going to keep prednisone for now, add Bactrim prophylaxis.  Will arrange for cardiac PET as soon as possible to assess for evidence of cardiac sarcoidosis.   Cefepime to finish 10/3.   Can go to telemetry.   Loralie Champagne 09/07/2022 7:59 AM

## 2022-09-07 NOTE — Progress Notes (Signed)
OT Cancellation Note  Patient Details Name: Victor Thompson MRN: [id number] DOB: 07/29/1961   Cancelled Treatment:    Reason Eval/Treat Not Completed: Other (comment) (Disucssed with nsg. Pt with VT with ICD firing. Will follow up later time as appropriate.)  [facility] 09/07/2022, 10:47 AM Maurie Boettcher, OT/L   Acute OT Clinical Specialist Acute Rehabilitation Services Pager [phone] Office [phone]

## 2022-09-07 NOTE — Progress Notes (Signed)
ANTICOAGULATION CONSULT NOTE   Pharmacy Consult for heparin Indication: atrial fibrillation  Allergies  Allergen Reactions   Ace Inhibitors Swelling    Swelling of the tongue   Influenza Vaccines Hives and Swelling    SWELLING REACTION UNSPECIFIED    Ketorolac Swelling    SWELLING REACTION UNSPECIFIED    Lidocaine Swelling    TONGUE SWELLS   Lisinopril Swelling    TONGUE SWELLING Pt reported problem with a BP med which sounded like  lisinopril  But as of 09/19/06,pt had tolerated altace without problem   Penicillins Swelling    TONGUE SWELLS Has patient had a PCN reaction causing immediate rash, facial/tongue/throat swelling, SOB or lightheadedness with hypotension: Yes Has patient had a PCN reaction causing severe rash involving mucus membranes or skin necrosis: No Has patient had a PCN reaction that required hospitalization No Has patient had a PCN reaction occurring within the last 10 years: Yes If all of the above answers are "NO", then may proceed with Cephalosporin use.     Patient Measurements: Height: '5\' 7"'$  (170.2 cm) Weight: 116 kg (255 lb 11.7 oz) IBW/kg (Calculated) : 66.1 HEPARIN DW (KG): 93.5   Vital Signs: Temp: 97.5 F (36.4 C) (10/03 0400) Temp Source: Axillary (10/03 0400) BP: 137/92 (10/03 0700) Pulse Rate: 68 (10/03 0700)  Labs: Recent Labs    09/05/22 0332 09/06/22 0354 09/06/22 0454 09/06/22 1149 09/06/22 2205 09/07/22 0450  HGB 10.4* 10.5*  --   --   --  10.9*  HCT 30.5* 31.3*  --   --   --  31.2*  PLT 221 228  --   --   --  264  APTT 62* 136*  --  117* 82* 75*  HEPARINUNFRC >1.10* >1.10*  --   --   --  0.73*  CREATININE 5.57* 3.51* 6.22*  --   --   --      Estimated Creatinine Clearance: 15.8 mL/min (A) (by C-G formula based on SCr of 6.22 mg/dL (H)).   Assessment: 61 yo male here with recurrent VT and with history of afib on apixaban (last dose 9/30 at ~ 11pm). He is also noted on CRRT. He is having trouble keeping po  medications down due to nausea. Pharmacy consulted to dose heparin  Daily aPTT is therapeutic at 73 seconds, heparin level still falsely elevated by recent DOAC use.  Goal of Therapy:  Heparin level 0.3-0.7 units/ml aPTT 66-102 seconds Monitor platelets by anticoagulation protocol: Yes   Plan:  Continue heparin 1100 units/h Daily aPTT, heparin level, CBC  Victor Thompson, PharmD, [facility], [facility] Clinical Pharmacist [phone] Please check AMION for all [place] numbers 09/07/2022

## 2022-09-08 DIAGNOSIS — Z992 Dependence on renal dialysis: Secondary | ICD-10-CM

## 2022-09-08 DIAGNOSIS — Z515 Encounter for palliative care: Secondary | ICD-10-CM

## 2022-09-08 DIAGNOSIS — Z66 Do not resuscitate: Secondary | ICD-10-CM

## 2022-09-08 LAB — CBC WITH DIFFERENTIAL/PLATELET
Abs Immature Granulocytes: 0.25 10*3/uL — ABNORMAL HIGH (ref 0.00–0.07)
Basophils Absolute: 0 10*3/uL (ref 0.0–0.1)
Basophils Relative: 0 %
Eosinophils Absolute: 0 10*3/uL (ref 0.0–0.5)
Eosinophils Relative: 0 %
HCT: 30.1 % — ABNORMAL LOW (ref 39.0–52.0)
Hemoglobin: 10.5 g/dL — ABNORMAL LOW (ref 13.0–17.0)
Immature Granulocytes: 2 %
Lymphocytes Relative: 7 %
Lymphs Abs: 0.9 10*3/uL (ref 0.7–4.0)
MCH: 33.1 pg (ref 26.0–34.0)
MCHC: 34.9 g/dL (ref 30.0–36.0)
MCV: 95 fL (ref 80.0–100.0)
Monocytes Absolute: 1.9 10*3/uL — ABNORMAL HIGH (ref 0.1–1.0)
Monocytes Relative: 13 %
Neutro Abs: 11 10*3/uL — ABNORMAL HIGH (ref 1.7–7.7)
Neutrophils Relative %: 78 %
Platelets: 244 10*3/uL (ref 150–400)
RBC: 3.17 MIL/uL — ABNORMAL LOW (ref 4.22–5.81)
RDW: 16.2 % — ABNORMAL HIGH (ref 11.5–15.5)
WBC: 14 10*3/uL — ABNORMAL HIGH (ref 4.0–10.5)
nRBC: 0 % (ref 0.0–0.2)

## 2022-09-08 LAB — BASIC METABOLIC PANEL
Anion gap: 15 (ref 5–15)
BUN: 63 mg/dL — ABNORMAL HIGH (ref 6–20)
CO2: 25 mmol/L (ref 22–32)
Calcium: 8.4 mg/dL — ABNORMAL LOW (ref 8.9–10.3)
Chloride: 94 mmol/L — ABNORMAL LOW (ref 98–111)
Creatinine, Ser: 6.76 mg/dL — ABNORMAL HIGH (ref 0.61–1.24)
GFR, Estimated: 9 mL/min — ABNORMAL LOW (ref >60)
Glucose, Bld: 108 mg/dL — ABNORMAL HIGH (ref 70–99)
Potassium: 3.6 mmol/L (ref 3.5–5.1)
Sodium: 134 mmol/L — ABNORMAL LOW (ref 135–145)

## 2022-09-08 LAB — GLUCOSE, CAPILLARY
Glucose-Capillary: 105 mg/dL — ABNORMAL HIGH (ref 70–99)
Glucose-Capillary: 109 mg/dL — ABNORMAL HIGH (ref 70–99)
Glucose-Capillary: 113 mg/dL — ABNORMAL HIGH (ref 70–99)
Glucose-Capillary: 119 mg/dL — ABNORMAL HIGH (ref 70–99)
Glucose-Capillary: 127 mg/dL — ABNORMAL HIGH (ref 70–99)
Glucose-Capillary: 131 mg/dL — ABNORMAL HIGH (ref 70–99)
Glucose-Capillary: 137 mg/dL — ABNORMAL HIGH (ref 70–99)
Glucose-Capillary: 152 mg/dL — ABNORMAL HIGH (ref 70–99)

## 2022-09-08 LAB — HEPATIC FUNCTION PANEL
ALT: 19 U/L (ref 0–44)
AST: 13 U/L — ABNORMAL LOW (ref 15–41)
Albumin: 2.6 g/dL — ABNORMAL LOW (ref 3.5–5.0)
Alkaline Phosphatase: 55 U/L (ref 38–126)
Bilirubin, Direct: <0.1 mg/dL (ref 0.0–0.2)
Total Bilirubin: 0.4 mg/dL (ref 0.3–1.2)
Total Protein: 5.6 g/dL — ABNORMAL LOW (ref 6.5–8.1)

## 2022-09-08 LAB — HEPATITIS C ANTIBODY: HCV Ab: NONREACTIVE — AB

## 2022-09-08 LAB — MAGNESIUM: Magnesium: 2.3 mg/dL (ref 1.7–2.4)

## 2022-09-08 MED ORDER — CARVEDILOL 6.25 MG PO TABS
9.3750 mg | ORAL_TABLET | Freq: Two times a day (BID) | ORAL | Status: DC
  Administered 2022-09-08: 9.375 mg via ORAL
  Filled 2022-09-08: qty 1

## 2022-09-08 MED ORDER — CARVEDILOL 12.5 MG PO TABS
12.5000 mg | ORAL_TABLET | Freq: Two times a day (BID) | ORAL | Status: DC
  Administered 2022-09-08 – 2022-09-10 (×4): 12.5 mg via ORAL
  Filled 2022-09-08 (×4): qty 1

## 2022-09-08 MED ORDER — CHLORHEXIDINE GLUCONATE CLOTH 2 % EX PADS
6.0000 | MEDICATED_PAD | Freq: Every day | CUTANEOUS | Status: DC
  Administered 2022-09-09: 6 via TOPICAL

## 2022-09-08 MED ORDER — METOPROLOL TARTRATE 5 MG/5ML IV SOLN
5.0000 mg | Freq: Once | INTRAVENOUS | Status: AC
  Administered 2022-09-08: 5 mg via INTRAVENOUS
  Filled 2022-09-08: qty 5

## 2022-09-08 MED ORDER — FENTANYL CITRATE PF 50 MCG/ML IJ SOSY: 50.0000 ug | PREFILLED_SYRINGE | INTRAMUSCULAR | Status: DC | PRN

## 2022-09-08 MED ORDER — AMIODARONE HCL 200 MG PO TABS
400.0000 mg | ORAL_TABLET | Freq: Three times a day (TID) | ORAL | Status: DC
  Administered 2022-09-08 – 2022-09-10 (×7): 400 mg via ORAL
  Filled 2022-09-08 (×7): qty 2

## 2022-09-08 MED ORDER — POTASSIUM CHLORIDE CRYS ER 20 MEQ PO TBCR
30.0000 meq | EXTENDED_RELEASE_TABLET | Freq: Three times a day (TID) | ORAL | Status: AC
  Administered 2022-09-08 (×2): 30 meq via ORAL
  Filled 2022-09-08 (×2): qty 1

## 2022-09-08 MED ORDER — LORAZEPAM 2 MG/ML IJ SOLN
1.0000 mg | INTRAMUSCULAR | Status: DC | PRN
  Administered 2022-09-08 – 2022-09-10 (×6): 1 mg via INTRAVENOUS
  Filled 2022-09-08 (×6): qty 1

## 2022-09-08 NOTE — Progress Notes (Signed)
Occupational Therapy Treatment Patient Details Name: Victor Thompson MRN: [id number] DOB: 1961-08-17 Today's Date: 09/08/2022   History of present illness Pt is a 61 y.o. male admitted from HD on 08/24/22 with c/o palpitations, feeling faint. In ED, recurrent episodes of VT with repeated ICD discharges. ETT 9/19-9/22. R/LHC on 9/20. CRRT 9/20-9/22, 9/26-9/29. 9/24 VT and reintubated. extubated 9/28. PMH includes CHF, AICD, CHF, CAD, ESRD (on HD), HTN, MI, HLD, OSA (uses CPAP), obesity, asthma.   OT comments  Excellent session. Pt had been talking to his family about "his situation", however agreeable to work with OT. Pt joking with therapist. Music used throughout session with pt dancing and singing to songs - "I might as well dance and enjoy myself". Nsg followed closely with recliner while pt ambulate @ 190 ft with 3 standing rest breaks. Pt states he will have assistance at home to assist with ADL/IADL as needed and prefers to DC home. At this time recommend follow up with [facility]. Acute OT will continue to follow. VSS on RA. Pt very appreciative.    Recommendations for follow up therapy are one component of a multi-disciplinary discharge planning process, led by the attending physician.  Recommendations may be updated based on patient status, additional functional criteria and insurance authorization.    Follow Up Recommendations  Home health OT    Assistance Recommended at Discharge Frequent or constant Supervision/Assistance  Patient can return home with the following      Equipment Recommendations  BSC/3in1    Recommendations for Other Services      Precautions / Restrictions Precautions Precautions: Fall;Other (comment) Precaution Comments: goes into VT; No ICD - have chair follow       Mobility Bed Mobility Overal bed mobility: Needs Assistance       Supine to sit: Supervision          Transfers Overall transfer level: Needs assistance Equipment used: Rolling walker  (2 wheels)   Sit to Stand: Min guard                 Balance     Sitting balance-Leahy Scale: Good       Standing balance-Leahy Scale: Fair                             ADL either performed or assessed with clinical judgement   ADL Overall ADL's : Needs assistance/impaired Eating/Feeding: Modified independent   Grooming: Supervision/safety;Set up   Upper Body Bathing: Minimal assistance   Lower Body Bathing: Moderate assistance;Sit to/from stand   Upper Body Dressing : Minimal assistance   Lower Body Dressing: Moderate assistance;Sit to/from stand               Functional mobility during ADLs: Minimal assistance;Rolling walker (2 wheels) General ADL Comments: States he walked into the bathroom earlier today with nsg; had HD yesterday    Extremity/Trunk Assessment Upper Extremity Assessment Upper Extremity Assessment: Generalized weakness            Vision       Perception     Praxis      Cognition Arousal/Alertness: Awake/alert Behavior During Therapy: WFL for tasks assessed/performed Overall Cognitive Status: No family/caregiver present to determine baseline cognitive functioning                                 General Comments: appropirate during sesison; most likely  close to baseline; had just had a discussion with Palliative prior to session, changin his status to DNR and agreeable to having his ICD shut off; verbalizing "I might as well....dance adn cut up"...referring to his medical situation        Exercises      Shoulder Instructions       General Comments      Pertinent Vitals/ Pain       Pain Assessment Pain Assessment: No/denies pain  Home Living                                          Prior Functioning/Environment              Frequency  Min 2X/week        Progress Toward Goals  OT Goals(current goals can now be found in the care plan section)  Progress towards  OT goals: Progressing toward goals  Acute Rehab OT Goals Patient Stated Goal: to go home OT Goal Formulation: With patient Time For Goal Achievement: 09/17/22 Potential to Achieve Goals: Good ADL Goals Pt Will Transfer to Toilet: with min assist;ambulating;bedside commode  Plan Discharge plan needs to be updated    Co-evaluation                 AM-PAC OT "6 Clicks" Daily Activity     Outcome Measure   Help from another person eating meals?: A Little Help from another person taking care of personal grooming?: A Little Help from another person toileting, which includes using toliet, bedpan, or urinal?: A Little Help from another person bathing (including washing, rinsing, drying)?: A Lot Help from another person to put on and taking off regular upper body clothing?: A Little Help from another person to put on and taking off regular lower body clothing?: A Lot 6 Click Score: 16    End of Session Equipment Utilized During Treatment: Gait belt;Rolling walker (2 wheels)  OT Visit Diagnosis: Unsteadiness on feet (R26.81);Muscle weakness (generalized) (M62.81);Pain   Activity Tolerance Patient tolerated treatment well   Patient Left in chair;with call bell/phone within reach;with chair alarm set   Nurse Communication Mobility status        Time: 7001-7494 OT Time Calculation (min): 32 min  Charges: OT General Charges $OT Visit: 1 Visit OT Treatments $Self Care/Home Management : 23-37 mins  Maurie Boettcher, OT/L   Acute OT Clinical Specialist [place] Pager [phone] Office [phone]   [facility] 09/08/2022, 3:20 PM

## 2022-09-08 NOTE — Progress Notes (Signed)
SICD turned off per previous order/doctor request.  [employer] [phone]

## 2022-09-08 NOTE — Progress Notes (Signed)
Patient ID: Victor Thompson, male   DOB: 04-26-1961, 61 y.o.   MRN: [id number]   Advanced Heart Failure Rounding Note  PCP-Cardiologist: Loralie Champagne, MD   Subjective:    Admitted with VT storm.   9/24 multiple repeated shocks for VT monomorphic, reintubated, rebolused with amio, quinidine now on hold, started on procainamide.   9/25 Given concern for possible cardiac sarcoid, started empirically on prednisone. IV procainamide discontinued by EP  9/27 started on ceftriaxone for suspected aspiration PNA. Mexiletine started for VT suppression   9/28 Extubated 10/3 Recurrent VT, ICD shock  He has finished cefepime. Trach aspirate with MSSA and Morganella.   iHD on 10/3. CVP 3. Planning for iHD today.  HiRes Chest CT (10/02):  No findings to suggest pulmonary sarcoidosis, linear opacities b/l LL and posterior LUL with mild ground-glass opacities (favor resolving infection or aspiration)  RHC/LHC (9/20):  Coronary Findings  Diagnostic Dominance: Right Left Anterior Descending  Ost LAD to Prox LAD lesion is 40% stenosed.    First Diagonal Branch  1st Diag lesion is 60% stenosed.    Left Circumflex  Mid Cx lesion is 30% stenosed.    Right Coronary Artery  Prox RCA lesion is 30% stenosed.    Intervention   No interventions have been documented.   Right Heart  Right Heart Pressures RHC Procedural Findings: Hemodynamics (mmHg) RA mean 14 RV 52/15 PA 53/27, mean 37 PCWP mean 26 LV 97/29 AO 103/64  Oxygen saturations: PA 76% AO 100%  Cardiac Output (Fick) 8.98  Cardiac Index (Fick) 3.96 PVR 2.8 WU    Objective:   Weight Range: 114.1 kg Body mass index is 39.4 kg/m.   Vital Signs:   Temp:  [97.9 F (36.6 C)-98.9 F (37.2 C)] 98.4 F (36.9 C) (10/04 0700) Pulse Rate:  [57-96] 57 (10/04 0700) Resp:  [16-26] 20 (10/04 0700) BP: (89-163)/(62-127) 120/70 (10/04 0700) SpO2:  [90 %-100 %] 93 % (10/04 0700) Arterial Line BP: (150)/(69) 150/69 (10/03  0800) Weight:  [113.8 kg-115.9 kg] 114.1 kg (10/04 0436) Last BM Date : 09/06/22  Weight change: Filed Weights   09/07/22 1312 09/07/22 1917 09/08/22 0436  Weight: 115.9 kg 113.8 kg 114.1 kg    Intake/Output:   Intake/Output Summary (Last 24 hours) at 09/08/2022 0752 Last data filed at 09/08/2022 0659 Gross per 24 hour  Intake 1102.3 ml  Output 2000 ml  Net -897.7 ml      Physical Exam   General: NAD Neck: No JVD, no thyromegaly or thyroid nodule.  Lungs: Clear to auscultation bilaterally with normal respiratory effort. CV: Nondisplaced PMI.  Heart regular S1/S2, no S3/S4, no murmur.  No peripheral edema.   Abdomen: Soft, nontender, no hepatosplenomegaly, no distention.  Skin: Intact without lesions or rashes.  Neurologic: Alert and oriented x 3.  Psych: Normal affect. Extremities: No clubbing or cyanosis.  HEENT: Normal.   Telemetry   SR 60s  EKG    N/A  Labs    CBC Recent Labs    09/07/22 0450 09/08/22 0435  WBC 17.2* 14.0*  NEUTROABS 13.8* 11.0*  HGB 10.9* 10.5*  HCT 31.2* 30.1*  MCV 94.3 95.0  PLT 264 510   Basic Metabolic Panel Recent Labs    09/06/22 0454 09/07/22 0800 09/08/22 0435  NA 138 136 134*  K 3.7 3.6 3.6  CL 97* 97* 94*  CO2 23 19* 25  GLUCOSE 109* 157* 108*  BUN 76* 109* 63*  CREATININE 6.22* 8.93* 6.76*  CALCIUM 8.8* 8.9 8.4*  MG  --  3.1* 2.3  PHOS 5.2* 4.8*  --    Liver Function Tests Recent Labs    09/06/22 0454 09/07/22 0800  ALBUMIN 2.8* 2.8*   No results for input(s): "LIPASE", "AMYLASE" in the last 72 hours. Cardiac Enzymes No results for input(s): "CKTOTAL", "CKMB", "CKMBINDEX", "TROPONINI" in the last 72 hours.  BNP: BNP (last 3 results) Recent Labs    05/25/22 0705 08/16/22 0627 08/16/22 1257  BNP 1,355.4* QUANTITY NOT SUFFICIENT, UNABLE TO PERFORM TEST 399.2*    ProBNP (last 3 results) No results for input(s): "PROBNP" in the last 8760 hours.   D-Dimer No results for input(s): "DDIMER" in the  last 72 hours. Hemoglobin A1C No results for input(s): "HGBA1C" in the last 72 hours. Fasting Lipid Panel No results for input(s): "CHOL", "HDL", "LDLCALC", "TRIG", "CHOLHDL", "LDLDIRECT" in the last 72 hours.  Thyroid Function Tests No results for input(s): "TSH", "T4TOTAL", "T3FREE", "THYROIDAB" in the last 72 hours.  Invalid input(s): "FREET3"   Other results:   Imaging    No results found.   Medications:     Scheduled Medications:  amiodarone  400 mg Oral BID   apixaban  5 mg Oral BID   atorvastatin  80 mg Oral QHS   carvedilol  6.25 mg Oral BID WC   Chlorhexidine Gluconate Cloth  6 each Topical Q0600   Chlorhexidine Gluconate Cloth  6 each Topical Q0600   Chlorhexidine Gluconate Cloth  6 each Topical Q0600   cholecalciferol  5,000 Units Oral Daily   melatonin  3 mg Oral QHS   methylPREDNISolone (SOLU-MEDROL) injection  40 mg Intravenous Q24H   mexiletine  300 mg Oral Q12H   multivitamin  1 tablet Oral QHS   nystatin  5 mL Oral QID   pantoprazole  40 mg Oral Daily   polyethylene glycol  17 g Oral Daily   scopolamine  1 patch Transdermal Q72H   sodium chloride flush  10-40 mL Intracatheter Q12H   sulfamethoxazole-trimethoprim  1 tablet Oral Once per day on Mon Wed Fri    Infusions:  sodium chloride Stopped (08/26/22 1922)   amiodarone 30 mg/hr (09/08/22 0659)   anticoagulant sodium citrate     anticoagulant sodium citrate      PRN Medications: acetaminophen, albuterol, anticoagulant sodium citrate, anticoagulant sodium citrate, fentaNYL, heparin, heparin, heparin, LORazepam, LORazepam, mouth rinse, pentafluoroprop-tetrafluoroeth, prochlorperazine, sodium chloride flush    Patient Profile   Mr Sliker is 60 year old with a history of VT, ESRD, CAD, ischemic CMP, and ESRD. He had PCI in 4580, uncertain what vessel was involved. Echo 2/22: EF 25-30% with moderate LV dilation and normal RV.  He had VT arrest in 2015, has [employer] subcutaneous ICD.  He went on  dialysis in 2/22.  LHC/RHC was done in 8/22, showing no significant CAD and stable hemodynamics.   Admitted with refractory VT complicated by shock.    Assessment/Plan   1. VT storm: Has has a Animator ICD.  Refractory monomorphic VT. Multiple shocks.  Later alternating NSR with a slow wide complex AIVR-like rhythm. Recurrent monomorphic VT 9/24 w/ repeated shocks>>re-intubated, rebolused with amio, quinidine discontinued, started on procainamide, now off.  Given concern for possible cardiac sarcoid, started empirically on prednisone, currently on 40 mg daily. HiRes Chest CT this admit with no evidence of pulmonary sarcoid. Cath this admit with nonobstructive CAD. Echo EF 20-25%, GIIIDD (restrictive), RV normal. Recurrent VT on 10/3 with ICD shock.  NSR overnight.  He remains on  po and IV amiodarone + mexiletine 300 bid + Coreg 6.25 mg bid.   - EP has decided against VT ablation, do not think it would be likely to be successful and very high risk. With VT again yesterday, I offered transfer to [facility] for another opinion on ablation (and potentially epicardial ablation) but with the caveat that it is still very high risk and of unlikely success.  He will think about it.  - Continue amiodarone IV and po until changed by EP.  - Mexiletine increased to 300 mg bid.    - Increase Coreg to 9.375 mg this morning and if BP tolerates, to 12.5 mg bid this afternoon.  - Long term very concerning, not candidate for LVAD with ESRD.   - Continue empiric prednisone 40 mg daily with Bactrim SS three times/week.   - Would like eventual cardiac PET for sarcoidosis but can't do at this center yet.  Will need to be at [facility] after discharge.  2. Acute on chronic systolic CHF with cardiogenic shock: In setting of incessant VT.  H/o NICM, echo this admission with EF 20-25%, normal RV function, mild-moderate MR, IVC dilated.  At last admission, unable to complete cardiac MRI due to artifact from [facility] ICD.  PYP scan was negative.   RHC this admit showed volume overload>>CRRT for volume removal.  Off CRRT. Off pressors.  CVP 3 today.  - Volume management via HD.  - Titrating Coreg as BP tolerates.    3. CAD: ?PCI in 2005, unsure what vessel.  Coronary angiography in 8/22 showed no significant CAD and also does not appear to show prior stent so h/o CAD is questionable. LHC this admission with nonobstructive CAD.  - Continue statin. - No ASA with anticoagulation.  4. ESRD: Since 2/22. TTS HD.  - Now back on iHD.   - Nephrology following. 5. ID: Suspect aspiration PNA.  Chest CT suspicious for this as well. Growing MSSA and Morganella from sputum.  He has completed course of cefepime.  6. Atrial fibrillation: Paroxysmal, noted this admission. Has also had AFL.  Back to NSR this morning.   - Remains on amiodarone, dosing per EP.  - Continue Eliquis.   Mobilize. PT/OT recommending SNF for rehab. CIR following for possible admission pending insurance auth.   Loralie Champagne 09/08/2022 7:52 AM

## 2022-09-08 NOTE — Progress Notes (Signed)
[facility] Progress Note  Subjective: seen in room, no new c/o's. K+ 3.6, mag 2.3 wnl. Hb 10.5.   Vitals:   09/08/22 0800 09/08/22 0811 09/08/22 0900 09/08/22 1000  BP:  (!) 158/68 126/72 122/77  Pulse:  74 71 67  Resp: (!) 24 (!) '28 18 18  '$ Temp:      TempSrc:      SpO2: 92% 92% 93% 92%  Weight:      Height:        Exam: Gen: sitting in bed, awake CVS: RRR Resp: shallow breaths  but clear anteriorly Abd: soft obese, nontender Ext: trace LE edema ACCESS: R AVF + T/B    OP HD: [place] TTS  4h 33mn  450/ 1.5  116.4kg  AVF  15g  Hep 3000 - hectorol 7 ug iv tiw - no esa     CT chest 10/1, 10/2 - no signs of pulm edema, minimal GG changes in lung fields   Assessment/ Plan: VT storm / VF - pt rx'd w/ IV amio, po quinidine, sedation / intubation, paralytics and procainamide and improved. Then 9/24 had more VT/ shocks and was reintubated and restarted on IV amio w/ procainamide. Procainamide stopped and mexilitine started 9/27, IV amio continued. Empiric pred 40 mg daily in case sarcoid is contributory (had restrictive pattern on TTE). Had more VT on 10/3. Per cardiology.  Hypokalemia - replace 30 x 2 today BP/volume: CVP 2-3, BPs stable, under dry wt. Keep even next HD.  ESRD - on HD TTS. SP CRRT 9/20- 9/22 and again from 9/25-9/29. Now back on iHD. Next HD tomorrow.  HFrEF / EF 20-25%/ sp ICD Anemia esrd - Hb 10s, follow and give ESA if needed MBD ckd - CCa in range, phos a bit high.  Cont auryxia 1 ac tid for now (usual dose is 2 ac). Phos is 4-5 range.  Afib: on Eliquis  Rob Donovan Gatchel 09/08/2022, 12:13 PM   Recent Labs  Lab 09/06/22 0454 09/07/22 0450 09/07/22 0800 09/08/22 0435 09/08/22 1046  HGB  --  10.9*  --  10.5*  --   ALBUMIN 2.8*  --  2.8*  --  2.6*  CALCIUM 8.8*  --  8.9 8.4*  --   PHOS 5.2*  --  4.8*  --   --   CREATININE 6.22*  --  8.93* 6.76*  --   K 3.7  --  3.6 3.6  --     No results for input(s): "IRON", "TIBC", "FERRITIN" in the  last 168 hours. Inpatient medications:  amiodarone  400 mg Oral TID   apixaban  5 mg Oral BID   atorvastatin  80 mg Oral QHS   carvedilol  9.375 mg Oral BID WC   Chlorhexidine Gluconate Cloth  6 each Topical Q0600   Chlorhexidine Gluconate Cloth  6 each Topical Q0600   Chlorhexidine Gluconate Cloth  6 each Topical Q0600   cholecalciferol  5,000 Units Oral Daily   melatonin  3 mg Oral QHS   methylPREDNISolone (SOLU-MEDROL) injection  40 mg Intravenous Q24H   mexiletine  300 mg Oral Q12H   multivitamin  1 tablet Oral QHS   nystatin  5 mL Oral QID   pantoprazole  40 mg Oral Daily   polyethylene glycol  17 g Oral Daily   scopolamine  1 patch Transdermal Q72H   sodium chloride flush  10-40 mL Intracatheter Q12H   sulfamethoxazole-trimethoprim  1 tablet Oral Once per day on Mon Wed Fri  sodium chloride Stopped (08/26/22 1922)   amiodarone 30 mg/hr (09/08/22 0800)   anticoagulant sodium citrate     anticoagulant sodium citrate     acetaminophen, albuterol, anticoagulant sodium citrate, anticoagulant sodium citrate, fentaNYL (SUBLIMAZE) injection, heparin, heparin, heparin, LORazepam, mouth rinse, pentafluoroprop-tetrafluoroeth, prochlorperazine, sodium chloride flush

## 2022-09-08 NOTE — Progress Notes (Signed)
This chaplain responded to PMT consult for creating/updating the Pt. Advance Directive. The chaplain was updated by the RN before the visit.  The chaplain began rapport building with the Pt. by providing education on the role of the chaplain on the medical team and giving the Pt. the opportunity to be in control spiritual care visits.  The chaplain listened reflectively as the Pt. shared he has lived through many experiences and looks forward to the "day of reckoning" with wisdom. The chaplain clarified the Pt. hope for a "day of reckoning" is having a day to take care of his final business the right way. The chaplain introduced Advance Directive education and offered a working document for preparing the Pt. Will. The Pt. expressed his interest but declined further conversation because of exhaustion.  The Pt. [employer] faith tradition provides him with strength for the journey. The Pt. favorite hymn is "He Lives" which the Pt. and chaplain listened to together. Plans were made to revisit on Thursday as the Pt. answered his cell phone.  Chaplain Sallyanne Kuster [phone]

## 2022-09-08 NOTE — Progress Notes (Addendum)
Electrophysiology Rounding Note  [name] Date of Encounter: 09/08/2022  Primary Cardiologist: Loralie Champagne, MD Electrophysiologist: Cristopher Peru, MD   Subjective   Had 1 appropriate shock yesterday for VT, converted to SR afterward. Discussed palliative care consult, patient understandable apprehensive. CHF team discussed option of tx to [facility] for high risk VT ablation, patient not agreeable to that at this time.  Inpatient Medications    Scheduled Meds:  amiodarone  400 mg Oral BID   apixaban  5 mg Oral BID   atorvastatin  80 mg Oral QHS   carvedilol  9.375 mg Oral BID WC   Chlorhexidine Gluconate Cloth  6 each Topical Q0600   Chlorhexidine Gluconate Cloth  6 each Topical Q0600   Chlorhexidine Gluconate Cloth  6 each Topical Q0600   cholecalciferol  5,000 Units Oral Daily   melatonin  3 mg Oral QHS   methylPREDNISolone (SOLU-MEDROL) injection  40 mg Intravenous Q24H   metoprolol tartrate  5 mg Intravenous Once   mexiletine  300 mg Oral Q12H   multivitamin  1 tablet Oral QHS   nystatin  5 mL Oral QID   pantoprazole  40 mg Oral Daily   polyethylene glycol  17 g Oral Daily   scopolamine  1 patch Transdermal Q72H   sodium chloride flush  10-40 mL Intracatheter Q12H   sulfamethoxazole-trimethoprim  1 tablet Oral Once per day on Mon Wed Fri   Continuous Infusions:  sodium chloride Stopped (08/26/22 1922)   amiodarone 30 mg/hr (09/08/22 0659)   anticoagulant sodium citrate     anticoagulant sodium citrate     PRN Meds: acetaminophen, albuterol, anticoagulant sodium citrate, anticoagulant sodium citrate, fentaNYL, heparin, heparin, heparin, LORazepam, LORazepam, mouth rinse, pentafluoroprop-tetrafluoroeth, prochlorperazine, sodium chloride flush   Vital Signs    Vitals:   09/08/22 0500 09/08/22 0600 09/08/22 0700 09/08/22 0811  BP: 116/68 124/70 120/70 (!) 158/68  Pulse: 64 67 (!) 57 74  Resp: (!) 22 (!) 24 20   Temp:   98.4 F (36.9 C)    TempSrc:   Oral   SpO2: 92% 95% 93%   Weight:      Height:        Intake/Output Summary (Last 24 hours) at 09/08/2022 0813 Last data filed at 09/08/2022 0659 Gross per 24 hour  Intake 1102.3 ml  Output 2000 ml  Net -897.7 ml   Filed Weights   09/07/22 1312 09/07/22 1917 09/08/22 0436  Weight: 115.9 kg 113.8 kg 114.1 kg    Physical Exam    Exam largely unchanged from prior  GEN- A & O x 3, and conversational HEENT- benign Lungs- CTA Heart- RRR, no murmurs, rubs or gallops GI- soft, +BS Extremities- no clubbing or cyanosis. trace edema Skin- no rash or lesion Neuro- answers questions appropriately, moves all extremities independently  Labs    CBC Recent Labs    09/07/22 0450 09/08/22 0435  WBC 17.2* 14.0*  NEUTROABS 13.8* 11.0*  HGB 10.9* 10.5*  HCT 31.2* 30.1*  MCV 94.3 95.0  PLT 264 638   Basic Metabolic Panel Recent Labs    09/06/22 0454 09/07/22 0800 09/08/22 0435  NA 138 136 134*  K 3.7 3.6 3.6  CL 97* 97* 94*  CO2 23 19* 25  GLUCOSE 109* 157* 108*  BUN 76* 109* 63*  CREATININE 6.22* 8.93* 6.76*  CALCIUM 8.8* 8.9 8.4*  MG  --  3.1* 2.3  PHOS 5.2* 4.8*  --    Liver Function Tests Recent  Labs    09/06/22 0454 09/07/22 0800  ALBUMIN 2.8* 2.8*   No results for input(s): "LIPASE", "AMYLASE" in the last 72 hours. Cardiac Enzymes No results for input(s): "CKTOTAL", "CKMB", "CKMBINDEX", "TROPONINI" in the last 72 hours.   Telemetry    SR in 60-70s, more frequent PVC sometimes in coupling  Radiology    09/06/22 HR Chest CT 1. No findings to suggest pulmonary sarcoidosis. 2. Linear opacities of the bilateral lower lobes and posterior left upper lobe with mild bilateral ground-glass opacities, ground-glass opacities appear slightly decreased when compared with prior exam. Favor resolving sequela of infection or aspiration. 3. Stable mildly enlarged mediastinal lymph nodes, likely reactive. 4. Severe left main and three-vessel coronary  artery calcifications. 5. Aortic Atherosclerosis (ICD10-I70.0) and Emphysema (ICD10-J43.9).  09/05/22 Chest CT wo There are patchy infiltrates in both lower lung fields suggesting atelectasis/pneumonia. There are slightly enlarged lymph nodes in mediastinum, possibly reactive hyperplasia of lymph nodes. 2. Cardiomegaly.  Coronary artery calcifications are seen. 3. 2.2 cm smooth marginated soft tissue density structure in the inferior margin of right adrenal most likely is partial volume averaging of upper pole of right kidney. Less likely possibility would be a nodule arising from the right adrenal. Follow-up CT abdomen may be considered for further evaluation. 4. Cervical spondylosis with encroachment of neural foramina in the visualized lower cervical spine.  08/25/22: R/LHC Left Anterior Descending  Ost LAD to Prox LAD lesion is 40% stenosed.    First Diagonal Branch  1st Diag lesion is 60% stenosed.    Left Circumflex  Mid Cx lesion is 30% stenosed.    Right Coronary Artery  Prox RCA lesion is 30% stenosed.     Right Heart Pressures RHC Procedural Findings: Hemodynamics (mmHg) RA mean 14 RV 52/15 PA 53/27, mean 37 PCWP mean 26 LV 97/29 AO 103/64  Oxygen saturations: PA 76% AO 100%  Cardiac Output (Fick) 8.98  Cardiac Index (Fick) 3.96 PVR 2.8 WU    08/25/2022: TTE  1. Left ventricular ejection fraction, by estimation, is 20 to 25%. The  left ventricle has severely decreased function. The left ventricle  demonstrates global hypokinesis. The left ventricular internal cavity size  was severely dilated. Left ventricular  diastolic parameters are consistent with Grade III diastolic dysfunction  (restrictive). Elevated left ventricular end-diastolic pressure.   2. Right ventricular systolic function is normal. The right ventricular  size is mildly enlarged. There is mildly elevated pulmonary artery  systolic pressure.   3. Left atrial size was moderately dilated.    4. Right atrial size was mildly dilated.   5. The mitral valve is normal in structure. Mild to moderate mitral valve  regurgitation. No evidence of mitral stenosis.   6. The aortic valve is grossly normal. Aortic valve regurgitation is not  visualized. No aortic stenosis is present.   7. The inferior vena cava is dilated in size with <50% respiratory  variability, suggesting right atrial pressure of 15 mmHg.   8. Cannot exclude a small PFO.   Comparison(s): Changes from prior study are noted. LV chamber now severely  dilated (7 cm).    10/21/21: TTE  1. Left ventricular ejection fraction, by estimation, is 25 to 30%. The  left ventricle has severely decreased function. The left ventricle  demonstrates global hypokinesis. The left ventricular internal cavity size  was moderately dilated. Left  ventricular diastolic parameters are consistent with Grade I diastolic  dysfunction (impaired relaxation).   2. Right ventricular systolic function is normal. The right  ventricular  size is normal. There is normal pulmonary artery systolic pressure. The  estimated right ventricular systolic pressure is 63.1 mmHg.   3. The mitral valve is normal in structure. Mild mitral valve  regurgitation. No evidence of mitral stenosis.   4. The aortic valve is tricuspid. Aortic valve regurgitation is not  visualized. No aortic stenosis is present.   5. The inferior vena cava is normal in size with greater than 50%  respiratory variability, suggesting right atrial pressure of 3 mmHg.    07/08/21; LHC 1. Normal filling pressures and normal cardiac output.  2. No significant CAD.  Nonischemic cardiomyopathy.   Patient Profile     Victor Thompson is a 61 y.o. male with a hx of  hx of VF arrest (2015), CAD (PCI in 2005, unknown details), ICM,. Chronic CHF (systolic), ESRF on HD , OSA w/CPAP, HTN, HLD, recurrent angio edema who was admitted for another VT storm.    Assessment & Plan    1.  VT storm /  refractory VT Was shocked 9/24 by both sICD and external defib. Doing well -- minimal, brief NSVT but having frequent atrial and ventricular ectopy. SICD Battery ~16% as of 10/2. Received additional appropriate shock 10/3 for VT. Discussed option of tx to [facility] for high risk VT ablation, patient is not agreeable to risk of procedure at this time. - palliative care has been consulted, appreciate recs. Patient aware that he has no available options for escalation of care - continue eliquis - continue Amio gtt @ 30 - increase PO amio to 400 TID - continue coreg  - one time dose of '5mg'$  lopressor IV for increased ectopy - tolerating mexilitine without angioedema; continue '300mg'$  BID - Failed quinidine   2. Chronic systolic CHF AHF team is on board -Coronary angiography in 07/2021 showed no significant CAD, and also did not appear to show prior "stent" which had been reported.  -R/LHC 08/25/2022 with Moderate non obstructive CAD and elevated filling pressures as above.  -PYP scan negative  -Had been planned for PET as outpatient for possible Sarcoid.  -ECG with IVCD 150 msec, thought not to be good candidate for CRT upgrade (not true LBBB) -Currently off pressors -Started on steroids if perhaps/possible sarcoid playing a role  3. ESRD Nephrology following.  Back on iHD   4. Acute hypoxemic respiratory failure Extubated 9/28 Appreciate HF and CCM care  5. AFib/flutter  Most tele events appear to be atrial extopy and flutter (eg 9/30 06:42:44 "Pacer not pacing" event shows flutter.   For questions or updates, please contact [place] Please consult [email or web address] for contact info under Cardiology/STEMI.  Signed, Mamie Levers, NP  09/08/2022, 8:13 AM  EP Attending  Patient seen and examined. Agree with above. The patient had a single episode of VT. He is not interested in transfer to [facility] at this time to try high risk VT ablation. I will increase amio po to tid dosing and continue IV  amio as well. Will use IV lopressor as his HR and bp allow to attempt to suppress PVC's. Prognosis is poor.  Carleene Overlie Jakyrah Holladay,MD

## 2022-09-09 DIAGNOSIS — R531 Weakness: Secondary | ICD-10-CM

## 2022-09-09 LAB — BASIC METABOLIC PANEL
Anion gap: 18 — ABNORMAL HIGH (ref 5–15)
BUN: 88 mg/dL — ABNORMAL HIGH (ref 6–20)
CO2: 22 mmol/L (ref 22–32)
Calcium: 8.3 mg/dL — ABNORMAL LOW (ref 8.9–10.3)
Chloride: 93 mmol/L — ABNORMAL LOW (ref 98–111)
Creatinine, Ser: 9.07 mg/dL — ABNORMAL HIGH (ref 0.61–1.24)
GFR, Estimated: 6 mL/min — ABNORMAL LOW (ref >60)
Glucose, Bld: 117 mg/dL — ABNORMAL HIGH (ref 70–99)
Potassium: 4.1 mmol/L (ref 3.5–5.1)
Sodium: 133 mmol/L — ABNORMAL LOW (ref 135–145)

## 2022-09-09 LAB — CBC WITH DIFFERENTIAL/PLATELET
Abs Immature Granulocytes: 0.14 10*3/uL — ABNORMAL HIGH (ref 0.00–0.07)
Basophils Absolute: 0 10*3/uL (ref 0.0–0.1)
Basophils Relative: 0 %
Eosinophils Absolute: 0 10*3/uL (ref 0.0–0.5)
Eosinophils Relative: 0 %
HCT: 28.8 % — ABNORMAL LOW (ref 39.0–52.0)
Hemoglobin: 9.9 g/dL — ABNORMAL LOW (ref 13.0–17.0)
Immature Granulocytes: 1 %
Lymphocytes Relative: 6 %
Lymphs Abs: 0.6 10*3/uL — ABNORMAL LOW (ref 0.7–4.0)
MCH: 33 pg (ref 26.0–34.0)
MCHC: 34.4 g/dL (ref 30.0–36.0)
MCV: 96 fL (ref 80.0–100.0)
Monocytes Absolute: 1.1 10*3/uL — ABNORMAL HIGH (ref 0.1–1.0)
Monocytes Relative: 10 %
Neutro Abs: 8.9 10*3/uL — ABNORMAL HIGH (ref 1.7–7.7)
Neutrophils Relative %: 83 %
Platelets: 222 10*3/uL (ref 150–400)
RBC: 3 MIL/uL — ABNORMAL LOW (ref 4.22–5.81)
RDW: 16.8 % — ABNORMAL HIGH (ref 11.5–15.5)
WBC: 10.8 10*3/uL — ABNORMAL HIGH (ref 4.0–10.5)
nRBC: 0 % (ref 0.0–0.2)

## 2022-09-09 LAB — GLUCOSE, CAPILLARY
Glucose-Capillary: 113 mg/dL — ABNORMAL HIGH (ref 70–99)
Glucose-Capillary: 159 mg/dL — ABNORMAL HIGH (ref 70–99)
Glucose-Capillary: 130 mg/dL — ABNORMAL HIGH (ref 70–99)
Glucose-Capillary: 187 mg/dL — ABNORMAL HIGH (ref 70–99)
Glucose-Capillary: 131 mg/dL — ABNORMAL HIGH (ref 70–99)

## 2022-09-09 LAB — MAGNESIUM: Magnesium: 2.4 mg/dL (ref 1.7–2.4)

## 2022-09-09 LAB — HEPATITIS B CORE ANTIBODY, TOTAL

## 2022-09-09 MED ORDER — HEPARIN SODIUM (PORCINE) 1000 UNIT/ML DIALYSIS: 2000.0000 [IU] | INTRAMUSCULAR | Status: DC | PRN

## 2022-09-09 MED ORDER — PREDNISONE 20 MG PO TABS
40.0000 mg | ORAL_TABLET | Freq: Every day | ORAL | Status: DC
  Administered 2022-09-09: 40 mg via ORAL
  Filled 2022-09-09 (×2): qty 2

## 2022-09-09 MED ORDER — PENTAFLUOROPROP-TETRAFLUOROETH EX AERO
INHALATION_SPRAY | CUTANEOUS | Status: AC
  Filled 2022-09-09: qty 30

## 2022-09-09 NOTE — Progress Notes (Signed)
Called to check on Forms for CPAP order. To check and see if supplies request was received. Per [employer] rep, they stated forms were not received.  Due Messages & faxes. Informed pcp of note verbally, she stated to f/u w/ CMA Victor Thompson) that covered while out of office. May you please inform if forms were faxed? Per pcp, to update [employer] on CPAP order. Thanks. ----DD,RMA

## 2022-09-09 NOTE — Progress Notes (Signed)
Pt refusing cpap for the night. ?

## 2022-09-09 NOTE — Progress Notes (Signed)
   09/09/22 1215  Vitals  Temp 97.7 F (36.5 C)  Pulse Rate 66  Resp 19  BP 127/68  SpO2 100 %  Weight 112.6 kg  Type of Weight Post-Dialysis  Post Treatment  Dialyzer Clearance Clear  Duration of HD Treatment -hour(s) 4 hour(s)  Hemodialysis Intake (mL) 0 mL  Liters Processed 83.9  Fluid Removed 900 mL  Tolerated HD Treatment Yes   Received patient in bed  Alert and oriented.  Informed consent signed and in chart.   Treatment initiated: 800 Treatment completed: 1215  Patient tolerated well.   Alert, without acute distress.  Hand-off given to patient's nurse.   Access used: [facility] Access issues: none    [facility]

## 2022-09-09 NOTE — Progress Notes (Signed)
PT Cancellation Note  Patient Details Name: Victor Thompson MRN: [id number] DOB: 1961-03-16   Cancelled Treatment:    Reason Eval/Treat Not Completed: Patient at procedure or test/unavailable (HD)   [name] [name] 09/09/2022, 7:53 AM [name], PT Acute Rehabilitation Services Office: [phone]

## 2022-09-09 NOTE — Progress Notes (Signed)
Patient ID: ERI PLATTEN, male   DOB: 03-30-1961, 61 y.o.   MRN: [id number]    Progress Note from the Palliative Medicine Team at [facility]   [name]        Date: 09/09/2022 DOB: 08-27-1961  Age: 61 y.o. MRN#: [id number] Attending Physician: Evans Lance, MD Primary Care Physician: [name], MD Admit Date: 08/24/2022   Medical records reviewed   61 y.o. male   admitted on 08/24/2022 with PMH history of VT, ESRD, CAD, ischemic CMP.  He had PCI in 4818, uncertain what vessel was involved. Echo 2/22: EF 25-30% with moderate LV dilation and normal RV.  He had VT arrest in 2015, has St Jude subcutaneous ICD.  [name] in 2/22.  LHC/RHC was done in 8/22, showing no significant CAD and stable hemodynamics.   Admitted with refractory VT complicated by shock.  Patient lives in [place] with his cousin/Brenda    Unfortunately patient continued with episodes of refractory V. tach.   Patient continues to require hemodialysis.  His prognosis is poor secondary to multiple co-morbidities.  Viable life prolonging measures are limited specific to cardiac disease.    Yesterday patient made decision to begin putting limits around aggressiveness of care.  He really requested for DNR/DNI status and for AICD  to be deactivated.  He does want to continue medical management of his multiple comorbidities including dialysis.  This NP assessed patient at the bedside as a follow up for palliative medicine needs and emotional support. Patient is alert and oriented, currently receiving hemodialysis at the bedside.  Ongoing education regarding next steps in transition of care.Patient remains hopeful for the possibility of returning home.  Raised awareness to the logistics of outpatient dialysis.  Patient feels that he has a friend who would be able to get him back and forth to outpatient dialysis,  however yesterday his cousin Jahir Halt does not feel that discharge  home is a viable option  I discussed with Mr. Siedschlag depending on outcomes and physical therapy's recommendations, the possibility of SNF for short-term rehab giving him the opportunity to improve to the point of greater independence, increasing the possibility of outpatient dialysis from home.    Education offered on hospice benefit; philosophy and eligibility.  Patient is high risk for decompensation at any time  Education offered today regarding  the importance of continued conversation with his family and the medical providers regarding overall plan of care and treatment options,  ensuring decisions are within the context of the patients values and GOCs.  Questions and concerns addressed   Discussed with APPs/ Nunzio Cobbs and Jonni Sanger  with electrophysiology  This nurse practitioner informed  the patient that I will be out of the hospital until Monday morning.  If the patient is still hospitalized I will follow-up at that time.  Call palliative medicine team phone # [phone] with questions or concerns in the interim   High medical complexity  Wadie Lessen NP  Palliative Medicine Team Team Phone # [phone] Pager [phone]

## 2022-09-09 NOTE — Progress Notes (Signed)
This chaplain shared a F/U spiritual care presence with the Pt. during CRRT.    The chaplain listened reflectively as the Pt. talked about the three people supporting him in his healthcare: cousin-Brenda, [place], and God daughter-Ktrina in [place]. The chaplain understands the Pt. goal or "bucket list" item is to return to [facility] for a simpler way of life. The chaplain supported the medical team's role of getting the Pt. to a place he can make the transition he is describing.  The chaplain encouraged the Pt. to participate in Advance Directive education and complete his HCPOA. The chaplain explained this person will add to his team of caregivers.  The chaplain understands the Pt. is interested in his mother serving as 6.  The chaplain informed the Pt. his four children are his next of kin and share the role of healthcare decision maker without documentation.  The AD document was left in the Pt. Room with plans for the chaplain to F/U on Friday.  The chaplain understands the Pt. son and daughter are visiting today.    The chaplain updated the Pt. RN before leaving the unit.  Chaplain Sallyanne Kuster [phone]

## 2022-09-09 NOTE — Progress Notes (Signed)
Victor Thompson Progress Note  Subjective: BP's stable to a bit soft. K+ / BUN /Cr stable today. Pt on HD this am.   Vitals:   09/09/22 0000 09/09/22 0500 09/09/22 0730 09/09/22 0745  BP: (!) 107/53  (!) 132/118 (!) 148/82  Pulse: 62  68 64  Resp: (!) 21  (!) 27 (!) 23  Temp:   98.3 F (36.8 C)   TempSrc:      SpO2: 100%  100% 100%  Weight:  113.6 kg 116.3 kg   Height:        Exam: Gen: sitting in bed, awake CVS: RRR Resp: shallow breaths  but clear anteriorly Abd: soft obese, nontender Ext: trace LE edema ACCESS: R AVF + T/B    OP HD: Victor Thompson TTS  4h 58mn  450/ 1.5  116.4kg  AVF  15g  Victor Thompson 3000 - hectorol 7 ug iv tiw - no esa     CT chest 10/1, 10/2 - no signs of pulm edema, minimal GG changes in lung fields   Assessment/ Plan: Victor Thompson storm / VF - pt rx'd w/ IV amio, po quinidine, sedation / intubation, paralytics and procainamide and improved. Then 9/24 had more Victor Thompson/ shocks and was reintubated and restarted on IV amio w/ procainamide. Procainamide stopped and mexilitine started 9/27, IV amio continued. Empiric pred 40 mg daily in case sarcoid is contributory (had restrictive pattern on TTE). Had more Victor Thompson on 10/3. Per cardiology.  Hypokalemia - replaced yest w/ 30 x 2 po BP/volume: CVP 5-7, BP's low normal to normal. Keep even on HD today.  ESRD - on HD TTS. SP CRRT 9/20- 9/22 and again from 9/25-9/29. Back on iHD now. Next HD today.  HFrEF - EF 20-25% Anemia esrd - Hb 10s, follow and give ESA if needed MBD ckd - CCa in range, phos a bit high.  Cont auryxia 1 ac tid for now (usual dose is 2 ac). Phos is 4-5 range.  Afib: on Eliquis GOC: intractable Victor Thompson w/ no good Rx options, pt is now DNR status and ICD turned off.   Victor Thompson 09/09/2022, 7:57 AM   Recent Labs  Lab 09/06/22 0454 09/07/22 0450 09/07/22 0800 09/08/22 0435 09/08/22 1046 09/09/22 0459  HGB  --    < >  --  10.5*  --  9.9*  ALBUMIN 2.8*  --  2.8*  --  2.6*  --   CALCIUM 8.8*  --  8.9 8.4*  --    --   PHOS 5.2*  --  4.8*  --   --   --   CREATININE 6.22*  --  8.93* 6.76*  --   --   K 3.7  --  3.6 3.6  --   --    < > = values in this interval not displayed.    No results for input(s): "IRON", "TIBC", "FERRITIN" in the last 168 hours. Inpatient medications:  amiodarone  400 mg Oral TID   apixaban  5 mg Oral BID   atorvastatin  80 mg Oral QHS   carvedilol  12.5 mg Oral BID WC   Chlorhexidine Gluconate Cloth  6 each Topical Q0600   Chlorhexidine Gluconate Cloth  6 each Topical Q0600   cholecalciferol  5,000 Units Oral Daily   melatonin  3 mg Oral QHS   mexiletine  300 mg Oral Q12H   multivitamin  1 tablet Oral QHS   nystatin  5 mL Oral QID   pantoprazole  40 mg Oral Daily  pentafluoroprop-tetrafluoroeth       polyethylene glycol  17 g Oral Daily   predniSONE  40 mg Oral Q breakfast   scopolamine  1 patch Transdermal Q72H   sodium chloride flush  10-40 mL Intracatheter Q12H   sulfamethoxazole-trimethoprim  1 tablet Oral Once per day on Mon Wed Fri    sodium chloride Stopped (08/26/22 1922)   amiodarone 30 mg/hr (09/09/22 0217)   acetaminophen, albuterol, fentaNYL (SUBLIMAZE) injection, heparin, LORazepam, mouth rinse, pentafluoroprop-tetrafluoroeth, prochlorperazine, sodium chloride flush

## 2022-09-09 NOTE — Progress Notes (Signed)
Transferred -in from [place] by wheel chair awake and alert. Prefers to be sitting in chair.

## 2022-09-09 NOTE — Progress Notes (Signed)
Form was faxed over on 09/06/2022

## 2022-09-09 NOTE — Progress Notes (Signed)
Patient ID: Victor Thompson, male   DOB: 08-28-1961, 61 y.o.   MRN: [id number]   Advanced Heart Failure Rounding Note  PCP-Cardiologist: Loralie Champagne, MD   Subjective:    Admitted with VT storm.   9/24 multiple repeated shocks for VT monomorphic, reintubated, rebolused with amio, quinidine now on hold, started on procainamide.   9/25 Given concern for possible cardiac sarcoid, started empirically on prednisone. IV procainamide discontinued by EP  9/27 started on ceftriaxone for suspected aspiration PNA. Mexiletine started for VT suppression   9/28 Extubated 10/3 Recurrent VT, ICD shock  No further VT, remains in NSR on IV and po amiodarone + mexiletine + Coreg.   He has finished cefepime. Trach aspirate with MSSA and Morganella.   iHD on 10/3. CVP 7. Planning for iHD today.  HiRes Chest CT (10/02):  No findings to suggest pulmonary sarcoidosis, linear opacities b/l LL and posterior LUL with mild ground-glass opacities (favor resolving infection or aspiration)  RHC/LHC (9/20):  Coronary Findings  Diagnostic Dominance: Right Left Anterior Descending  Ost LAD to Prox LAD lesion is 40% stenosed.    First Diagonal Branch  1st Diag lesion is 60% stenosed.    Left Circumflex  Mid Cx lesion is 30% stenosed.    Right Coronary Artery  Prox RCA lesion is 30% stenosed.    Intervention   No interventions have been documented.   Right Heart  Right Heart Pressures RHC Procedural Findings: Hemodynamics (mmHg) RA mean 14 RV 52/15 PA 53/27, mean 37 PCWP mean 26 LV 97/29 AO 103/64  Oxygen saturations: PA 76% AO 100%  Cardiac Output (Fick) 8.98  Cardiac Index (Fick) 3.96 PVR 2.8 WU    Objective:   Weight Range: 116.3 kg Body mass index is 40.16 kg/m.   Vital Signs:   Temp:  [98.3 F (36.8 C)-98.7 F (37.1 C)] 98.3 F (36.8 C) (10/05 0730) Pulse Rate:  [51-74] 68 (10/05 0730) Resp:  [17-28] 27 (10/05 0730) BP: (93-158)/(53-93) 107/53 (10/05 0000) SpO2:  [88  %-100 %] 100 % (10/05 0730) Weight:  [113.6 kg-116.3 kg] 116.3 kg (10/05 0730) Last BM Date : 09/08/22  Weight change: Filed Weights   09/08/22 0436 09/09/22 0500 09/09/22 0730  Weight: 114.1 kg 113.6 kg 116.3 kg    Intake/Output:   Intake/Output Summary (Last 24 hours) at 09/09/2022 0754 Last data filed at 09/09/2022 0000 Gross per 24 hour  Intake 303.03 ml  Output --  Net 303.03 ml      Physical Exam   General: NAD Neck: No JVD, no thyromegaly or thyroid nodule.  Lungs: Clear to auscultation bilaterally with normal respiratory effort. CV: Nondisplaced PMI.  Heart regular S1/S2, no S3/S4, no murmur.  No peripheral edema.   Abdomen: Soft, nontender, no hepatosplenomegaly, no distention.  Skin: Intact without lesions or rashes.  Neurologic: Alert and oriented x 3.  Psych: Normal affect. Extremities: No clubbing or cyanosis.  HEENT: Normal.    Telemetry   SR 60s, occasional PVCs (personally reviewed).   EKG    N/A  Labs    CBC Recent Labs    09/08/22 0435 09/09/22 0459  WBC 14.0* 10.8*  NEUTROABS 11.0* 8.9*  HGB 10.5* 9.9*  HCT 30.1* 28.8*  MCV 95.0 96.0  PLT 244 865   Basic Metabolic Panel Recent Labs    09/07/22 0800 09/08/22 0435  NA 136 134*  K 3.6 3.6  CL 97* 94*  CO2 19* 25  GLUCOSE 157* 108*  BUN 109* 63*  CREATININE 8.93*  6.76*  CALCIUM 8.9 8.4*  MG 3.1* 2.3  PHOS 4.8*  --    Liver Function Tests Recent Labs    09/07/22 0800 09/08/22 1046  AST  --  13*  ALT  --  19  ALKPHOS  --  55  BILITOT  --  0.4  PROT  --  5.6*  ALBUMIN 2.8* 2.6*   No results for input(s): "LIPASE", "AMYLASE" in the last 72 hours. Cardiac Enzymes No results for input(s): "CKTOTAL", "CKMB", "CKMBINDEX", "TROPONINI" in the last 72 hours.  BNP: BNP (last 3 results) Recent Labs    05/25/22 0705 08/16/22 0627 08/16/22 1257  BNP 1,355.4* QUANTITY NOT SUFFICIENT, UNABLE TO PERFORM TEST 399.2*    ProBNP (last 3 results) No results for input(s):  "PROBNP" in the last 8760 hours.   D-Dimer No results for input(s): "DDIMER" in the last 72 hours. Hemoglobin A1C No results for input(s): "HGBA1C" in the last 72 hours. Fasting Lipid Panel No results for input(s): "CHOL", "HDL", "LDLCALC", "TRIG", "CHOLHDL", "LDLDIRECT" in the last 72 hours.  Thyroid Function Tests No results for input(s): "TSH", "T4TOTAL", "T3FREE", "THYROIDAB" in the last 72 hours.  Invalid input(s): "FREET3"   Other results:   Imaging    No results found.   Medications:     Scheduled Medications:  amiodarone  400 mg Oral TID   apixaban  5 mg Oral BID   atorvastatin  80 mg Oral QHS   carvedilol  12.5 mg Oral BID WC   Chlorhexidine Gluconate Cloth  6 each Topical Q0600   Chlorhexidine Gluconate Cloth  6 each Topical Q0600   cholecalciferol  5,000 Units Oral Daily   melatonin  3 mg Oral QHS   mexiletine  300 mg Oral Q12H   multivitamin  1 tablet Oral QHS   nystatin  5 mL Oral QID   pantoprazole  40 mg Oral Daily   pentafluoroprop-tetrafluoroeth       polyethylene glycol  17 g Oral Daily   predniSONE  40 mg Oral Q breakfast   scopolamine  1 patch Transdermal Q72H   sodium chloride flush  10-40 mL Intracatheter Q12H   sulfamethoxazole-trimethoprim  1 tablet Oral Once per day on Mon Wed Fri    Infusions:  sodium chloride Stopped (08/26/22 1922)   amiodarone 30 mg/hr (09/09/22 0217)    PRN Medications: acetaminophen, albuterol, fentaNYL (SUBLIMAZE) injection, heparin, LORazepam, mouth rinse, pentafluoroprop-tetrafluoroeth, prochlorperazine, sodium chloride flush    Patient Profile   Victor Thompson is 61 year old with a history of VT, ESRD, CAD, ischemic CMP, and ESRD. He had PCI in 2202, uncertain what vessel was involved. Echo 2/22: EF 25-30% with moderate LV dilation and normal RV.  He had VT arrest in 2015, has St Jude subcutaneous ICD.  [name] in 2/22.  LHC/RHC was done in 8/22, showing no significant CAD and stable hemodynamics.    Admitted with refractory VT complicated by shock.    Assessment/Plan   1. VT storm: Has has a Animator ICD.  Refractory monomorphic VT. Multiple shocks.  Later alternating NSR with a slow wide complex AIVR-like rhythm. Recurrent monomorphic VT 9/24 w/ repeated shocks>>re-intubated, rebolused with amio, quinidine discontinued, started on procainamide, now off.  Given concern for possible cardiac sarcoid, started empirically on prednisone, currently on 40 mg daily. HiRes Chest CT this admit with no evidence of pulmonary sarcoid. Cath this admit with nonobstructive CAD. Echo EF 20-25%, GIIIDD (restrictive), RV normal. Recurrent VT on 10/3 with ICD shock.  No further VT over the last day.  He remains on po and IV amiodarone + mexiletine 300 bid + Coreg 12.5 mg bid.   - EP has decided against VT ablation, do not think it would be likely to be successful and very high risk. With VT again yesterday, I offered transfer to [facility] for another opinion on ablation (and potentially epicardial ablation) but with the caveat that it is still very high risk and of unlikely success.  He was not interested in this.   - Continue amiodarone po tid and stop IV amiodarone.  - Continue mexiletine 300 mg bid.    - Continue Coreg 12.5 mg bid, can increase in future if BP tolerates.  - Long term very concerning, not candidate for LVAD with ESRD.   - Transition back to empiric prednisone 40 mg daily with Bactrim SS three times/week.  He was on Solumedrol when nauseated.  - Would like eventual cardiac PET for sarcoidosis but can't do at this center yet.  Will need to be at [facility] after discharge.  2. Acute on chronic systolic CHF with cardiogenic shock: In setting of incessant VT.  H/o NICM, echo this admission with EF 20-25%, normal RV function, mild-moderate Victor, IVC dilated.  At last admission, unable to complete cardiac MRI due to artifact from [facility] ICD.  PYP scan was negative.  RHC this admit showed volume overload>>CRRT for  volume removal.  Off CRRT. Off pressors.  CVP 7 today.  - Volume management via HD.  - Titrating Coreg as BP tolerates.    3. CAD: ?PCI in 2005, unsure what vessel.  Coronary angiography in 8/22 showed no significant CAD and also does not appear to show prior stent so h/o CAD is questionable. LHC this admission with nonobstructive CAD.  - Continue statin. - No ASA with anticoagulation.  4. ESRD: Since 2/22. TTS HD.  - Now back on iHD.   - Nephrology following. 5. ID: Suspect aspiration PNA.  Chest CT suspicious for this as well. Growing MSSA and Morganella from sputum.  He has completed course of cefepime.  6. Atrial fibrillation: Paroxysmal, noted this admission. Has also had AFL.  Back to NSR this morning.   - Remains on amiodarone, dosing per EP.  - Continue Eliquis.  7. Code status: Now DNR/DNI and ICD off given intractable VT with no good long-term options.  Will continue aggressive medical management but no plan for ablation or further invasive intervention.   Mobilize. PT/OT recommending SNF for rehab. CIR following for possible admission pending insurance auth. Can go to floor today after HD.    Loralie Champagne 09/09/2022 7:54 AM

## 2022-09-09 NOTE — Progress Notes (Addendum)
Electrophysiology Rounding Note  [name] Date of Encounter: 09/09/2022  Primary Cardiologist: Loralie Champagne, MD Electrophysiologist: Cristopher Peru, MD   Subjective   Talked with palliative care team yesterday. Patient made DNR/DNI and deactivated his AICD. Discussed stopping amio IV  Inpatient Medications    Scheduled Meds:  amiodarone  400 mg Oral TID   apixaban  5 mg Oral BID   atorvastatin  80 mg Oral QHS   carvedilol  12.5 mg Oral BID WC   Chlorhexidine Gluconate Cloth  6 each Topical Q0600   Chlorhexidine Gluconate Cloth  6 each Topical Q0600   cholecalciferol  5,000 Units Oral Daily   melatonin  3 mg Oral QHS   methylPREDNISolone (SOLU-MEDROL) injection  40 mg Intravenous Q24H   mexiletine  300 mg Oral Q12H   multivitamin  1 tablet Oral QHS   nystatin  5 mL Oral QID   pantoprazole  40 mg Oral Daily   pentafluoroprop-tetrafluoroeth       polyethylene glycol  17 g Oral Daily   scopolamine  1 patch Transdermal Q72H   sodium chloride flush  10-40 mL Intracatheter Q12H   sulfamethoxazole-trimethoprim  1 tablet Oral Once per day on Mon Wed Fri   Continuous Infusions:  sodium chloride Stopped (08/26/22 1922)   amiodarone 30 mg/hr (09/09/22 0217)   PRN Meds: acetaminophen, albuterol, fentaNYL (SUBLIMAZE) injection, heparin, LORazepam, mouth rinse, pentafluoroprop-tetrafluoroeth, prochlorperazine, sodium chloride flush   Vital Signs    Vitals:   09/08/22 2300 09/09/22 0000 09/09/22 0500 09/09/22 0730  BP: (!) 115/55 (!) 107/53    Pulse: 60 62  68  Resp: 19 (!) 21  (!) 27  Temp:    98.3 F (36.8 C)  TempSrc:      SpO2: 100% 100%  100%  Weight:   113.6 kg 116.3 kg  Height:        Intake/Output Summary (Last 24 hours) at 09/09/2022 0743 Last data filed at 09/09/2022 0000 Gross per 24 hour  Intake 303.03 ml  Output --  Net 303.03 ml   Filed Weights   09/08/22 0436 09/09/22 0500 09/09/22 0730  Weight: 114.1 kg 113.6 kg 116.3 kg     Physical Exam    Exam largely unchanged from prior  GEN- A & O x 3, and conversational HEENT- benign Lungs- CTA Heart- RRR, no murmurs, rubs or gallops GI- soft, +BS Extremities- no clubbing or cyanosis. trace edema Skin- no rash or lesion Neuro- answers questions appropriately, moves all extremities independently  Labs    CBC Recent Labs    09/08/22 0435 09/09/22 0459  WBC 14.0* 10.8*  NEUTROABS 11.0* 8.9*  HGB 10.5* 9.9*  HCT 30.1* 28.8*  MCV 95.0 96.0  PLT 244 585   Basic Metabolic Panel Recent Labs    09/07/22 0800 09/08/22 0435  NA 136 134*  K 3.6 3.6  CL 97* 94*  CO2 19* 25  GLUCOSE 157* 108*  BUN 109* 63*  CREATININE 8.93* 6.76*  CALCIUM 8.9 8.4*  MG 3.1* 2.3  PHOS 4.8*  --    Liver Function Tests Recent Labs    09/07/22 0800 09/08/22 1046  AST  --  13*  ALT  --  19  ALKPHOS  --  55  BILITOT  --  0.4  PROT  --  5.6*  ALBUMIN 2.8* 2.6*   No results for input(s): "LIPASE", "AMYLASE" in the last 72 hours. Cardiac Enzymes No results for input(s): "CKTOTAL", "CKMB", "CKMBINDEX", "TROPONINI" in the last  72 hours.   Telemetry    SR in 60-70s, less ectopy than prior  Radiology    09/06/22 HR Chest CT 1. No findings to suggest pulmonary sarcoidosis. 2. Linear opacities of the bilateral lower lobes and posterior left upper lobe with mild bilateral ground-glass opacities, ground-glass opacities appear slightly decreased when compared with prior exam. Favor resolving sequela of infection or aspiration. 3. Stable mildly enlarged mediastinal lymph nodes, likely reactive. 4. Severe left main and three-vessel coronary artery calcifications. 5. Aortic Atherosclerosis (ICD10-I70.0) and Emphysema (ICD10-J43.9).  09/05/22 Chest CT wo There are patchy infiltrates in both lower lung fields suggesting atelectasis/pneumonia. There are slightly enlarged lymph nodes in mediastinum, possibly reactive hyperplasia of lymph nodes. 2. Cardiomegaly.  Coronary  artery calcifications are seen. 3. 2.2 cm smooth marginated soft tissue density structure in the inferior margin of right adrenal most likely is partial volume averaging of upper pole of right kidney. Less likely possibility would be a nodule arising from the right adrenal. Follow-up CT abdomen may be considered for further evaluation. 4. Cervical spondylosis with encroachment of neural foramina in the visualized lower cervical spine.  08/25/22: R/LHC Left Anterior Descending  Ost LAD to Prox LAD lesion is 40% stenosed.    First Diagonal Branch  1st Diag lesion is 60% stenosed.    Left Circumflex  Mid Cx lesion is 30% stenosed.    Right Coronary Artery  Prox RCA lesion is 30% stenosed.     Right Heart Pressures RHC Procedural Findings: Hemodynamics (mmHg) RA mean 14 RV 52/15 PA 53/27, mean 37 PCWP mean 26 LV 97/29 AO 103/64  Oxygen saturations: PA 76% AO 100%  Cardiac Output (Fick) 8.98  Cardiac Index (Fick) 3.96 PVR 2.8 WU    08/25/2022: TTE  1. Left ventricular ejection fraction, by estimation, is 20 to 25%. The  left ventricle has severely decreased function. The left ventricle  demonstrates global hypokinesis. The left ventricular internal cavity size  was severely dilated. Left ventricular  diastolic parameters are consistent with Grade III diastolic dysfunction  (restrictive). Elevated left ventricular end-diastolic pressure.   2. Right ventricular systolic function is normal. The right ventricular  size is mildly enlarged. There is mildly elevated pulmonary artery  systolic pressure.   3. Left atrial size was moderately dilated.   4. Right atrial size was mildly dilated.   5. The mitral valve is normal in structure. Mild to moderate mitral valve  regurgitation. No evidence of mitral stenosis.   6. The aortic valve is grossly normal. Aortic valve regurgitation is not  visualized. No aortic stenosis is present.   7. The inferior vena cava is dilated in size  with <50% respiratory  variability, suggesting right atrial pressure of 15 mmHg.   8. Cannot exclude a small PFO.   Comparison(s): Changes from prior study are noted. LV chamber now severely  dilated (7 cm).    10/21/21: TTE  1. Left ventricular ejection fraction, by estimation, is 25 to 30%. The  left ventricle has severely decreased function. The left ventricle  demonstrates global hypokinesis. The left ventricular internal cavity size  was moderately dilated. Left  ventricular diastolic parameters are consistent with Grade I diastolic  dysfunction (impaired relaxation).   2. Right ventricular systolic function is normal. The right ventricular  size is normal. There is normal pulmonary artery systolic pressure. The  estimated right ventricular systolic pressure is 71.0 mmHg.   3. The mitral valve is normal in structure. Mild mitral valve  regurgitation. No evidence of  mitral stenosis.   4. The aortic valve is tricuspid. Aortic valve regurgitation is not  visualized. No aortic stenosis is present.   5. The inferior vena cava is normal in size with greater than 50%  respiratory variability, suggesting right atrial pressure of 3 mmHg.    07/08/21; LHC 1. Normal filling pressures and normal cardiac output.  2. No significant CAD.  Nonischemic cardiomyopathy.   Patient Profile     Victor Thompson is a 61 y.o. male with a hx of  hx of VF arrest (2015), CAD (PCI in 2005, unknown details), ICM,. Chronic CHF (systolic), ESRF on HD , OSA w/CPAP, HTN, HLD, recurrent angio edema who was admitted for another VT storm.   DNR/DNI   Assessment & Plan    1.  VT storm / refractory VT Was shocked 9/24 by both sICD and external defib. Doing well -- minimal, brief NSVT but having frequent atrial and ventricular ectopy. SICD Battery ~16% as of 10/2. Received additional appropriate shock 10/3 for VT. Discussed option of tx to [facility] for high risk VT ablation, patient is not agreeable to risk of  procedure at this time. - palliative care has been consulted, appreciate recs.  - continue eliquis - continue Amio gtt @ 30, stop after dialysis  - continue PO amio to 400 TID - continue coreg  - tolerating mexilitine without angioedema; continue '300mg'$  BID - Failed quinidine   2. Chronic systolic CHF AHF team is on board -Coronary angiography in 07/2021 showed no significant CAD, and also did not appear to show prior "stent" which had been reported.  -R/LHC 08/25/2022 with Moderate non obstructive CAD and elevated filling pressures as above.  -PYP scan negative  -Had been planned for PET as outpatient for possible Sarcoid.  -ECG with IVCD 150 msec, thought not to be good candidate for CRT upgrade (not true LBBB) -Currently off pressors -Started on steroids if perhaps/possible sarcoid playing a role  3. ESRD Nephrology following.  Back on iHD   4. Acute hypoxemic respiratory failure Extubated 9/28 Appreciate HF and CCM care  5. AFib/flutter  AC - Continue Eliquis    For questions or updates, please contact [place] Please consult [email or web address] for contact info under Cardiology/STEMI.  Signed, Mamie Levers, NP  09/09/2022, 7:43 AM  EP Attending Patient seen and examined. Agree with above. We will stop IV amiodarone and plan to DC home tomorrow with hospice care. Continue amio and mexitil.  Carleene Overlie Cleopha Indelicato,MD

## 2022-09-09 NOTE — Progress Notes (Signed)
Call placed to [employer] and rep informed me that forms has been received.

## 2022-09-09 NOTE — Progress Notes (Signed)
IP rehab admissions - Noted OT now recommending home with [facility] therapies.  Will await PT update and then decide on rehab venue needed.  Call me for questions.  [phone]

## 2022-09-10 ENCOUNTER — Other Ambulatory Visit (HOSPITAL_COMMUNITY): Payer: Self-pay

## 2022-09-10 LAB — BASIC METABOLIC PANEL
Anion gap: 15 (ref 5–15)
BUN: 62 mg/dL — ABNORMAL HIGH (ref 6–20)
CO2: 23 mmol/L (ref 22–32)
Calcium: 8.2 mg/dL — ABNORMAL LOW (ref 8.9–10.3)
Chloride: 98 mmol/L (ref 98–111)
Creatinine, Ser: 7.45 mg/dL — ABNORMAL HIGH (ref 0.61–1.24)
GFR, Estimated: 8 mL/min — ABNORMAL LOW (ref >60)
Glucose, Bld: 104 mg/dL — ABNORMAL HIGH (ref 70–99)
Potassium: 4.2 mmol/L (ref 3.5–5.1)
Sodium: 136 mmol/L (ref 135–145)

## 2022-09-10 LAB — GLUCOSE, CAPILLARY
Glucose-Capillary: 105 mg/dL — ABNORMAL HIGH (ref 70–99)
Glucose-Capillary: 102 mg/dL — ABNORMAL HIGH (ref 70–99)

## 2022-09-10 LAB — CBC WITH DIFFERENTIAL/PLATELET
Abs Immature Granulocytes: 0.11 10*3/uL — ABNORMAL HIGH (ref 0.00–0.07)
Basophils Absolute: 0 10*3/uL (ref 0.0–0.1)
Basophils Relative: 0 %
Eosinophils Absolute: 0 10*3/uL (ref 0.0–0.5)
Eosinophils Relative: 0 %
HCT: 29.2 % — ABNORMAL LOW (ref 39.0–52.0)
Hemoglobin: 10.3 g/dL — ABNORMAL LOW (ref 13.0–17.0)
Immature Granulocytes: 1 %
Lymphocytes Relative: 5 %
Lymphs Abs: 0.4 10*3/uL — ABNORMAL LOW (ref 0.7–4.0)
MCH: 33.8 pg (ref 26.0–34.0)
MCHC: 35.3 g/dL (ref 30.0–36.0)
MCV: 95.7 fL (ref 80.0–100.0)
Monocytes Absolute: 0.6 10*3/uL (ref 0.1–1.0)
Monocytes Relative: 7 %
Neutro Abs: 7.4 10*3/uL (ref 1.7–7.7)
Neutrophils Relative %: 87 %
Platelets: 237 10*3/uL (ref 150–400)
RBC: 3.05 MIL/uL — ABNORMAL LOW (ref 4.22–5.81)
RDW: 16.8 % — ABNORMAL HIGH (ref 11.5–15.5)
WBC: 8.5 10*3/uL (ref 4.0–10.5)
nRBC: 0 % (ref 0.0–0.2)

## 2022-09-10 LAB — MAGNESIUM: Magnesium: 2 mg/dL (ref 1.7–2.4)

## 2022-09-10 MED ORDER — APIXABAN 5 MG PO TABS
5.0000 mg | ORAL_TABLET | Freq: Two times a day (BID) | ORAL | 6 refills | Status: DC
  Filled 2022-09-10 – 2022-10-03 (×2): qty 60, 30d supply, fill #0

## 2022-09-10 MED ORDER — PREDNISONE 10 MG PO TABS
30.0000 mg | ORAL_TABLET | Freq: Every day | ORAL | 2 refills | Status: DC
  Filled 2022-09-10: qty 90, 30d supply, fill #0

## 2022-09-10 MED ORDER — PREDNISONE 20 MG PO TABS: 30.0000 mg | ORAL_TABLET | Freq: Every day | ORAL | Status: DC

## 2022-09-10 MED ORDER — PANTOPRAZOLE SODIUM 40 MG PO TBEC
40.0000 mg | DELAYED_RELEASE_TABLET | Freq: Every day | ORAL | 6 refills | Status: DC
  Filled 2022-09-10 – 2022-10-03 (×3): qty 30, 30d supply, fill #0

## 2022-09-10 MED ORDER — CARVEDILOL 12.5 MG PO TABS
12.5000 mg | ORAL_TABLET | Freq: Two times a day (BID) | ORAL | 6 refills | Status: DC
  Filled 2022-09-10: qty 60, 30d supply, fill #0

## 2022-09-10 MED ORDER — SULFAMETHOXAZOLE-TRIMETHOPRIM 400-80 MG PO TABS
1.0000 | ORAL_TABLET | ORAL | 0 refills | Status: DC
  Filled 2022-09-10: qty 36, 84d supply, fill #0

## 2022-09-10 MED ORDER — MEXILETINE HCL 150 MG PO CAPS
300.0000 mg | ORAL_CAPSULE | Freq: Two times a day (BID) | ORAL | 6 refills | Status: DC
  Filled 2022-09-10 – 2022-10-03 (×3): qty 120, 30d supply, fill #0

## 2022-09-10 MED ORDER — AMIODARONE HCL 200 MG PO TABS
ORAL_TABLET | ORAL | 0 refills | Status: DC
  Filled 2022-09-10: qty 204, 44d supply, fill #0

## 2022-09-10 NOTE — Progress Notes (Signed)
RN removed RIJ with no complications. Covered site w/ petroleum gauze, gauze, and tape. RN went over d/c summary w/ pt and pt's family member. Pt reported missing bracelet and t-shirt. Security given report by NS. Pt has other belongings, including rollator and TOC meds. PT finishing bed rest and then will be transported home by his cousin.   RN and NT transported pt to private vehicle.

## 2022-09-10 NOTE — TOC Initial Note (Signed)
Transition of Care [facility]) - Initial/Assessment Note    Patient Details  Name: Victor Thompson MRN: [id number] Date of Birth: 17-Oct-1961  Transition of Care [facility]) CM/SW Contact:    Erenest Rasher, RN Phone Number: [phone] 09/10/2022, 11:43 AM  Clinical Narrative:                 HF TOC CM spoke to pt and offered choice for [facility] (placed medicare.gov list with ratings on chart). Pt agreeable to [facility] for [facility]. States he has bedside commode and scale at home. [place] for [employer] with seat for home. Friend will provide transportation home. Pt followed by [facility] RN CM.   Expected Discharge Plan: [place] Barriers to Discharge: No Barriers Identified   Patient Goals and CMS Choice   CMS Medicare.gov Compare Post Acute Care list provided to:: Patient Choice offered to / list presented to : Patient  Expected Discharge Plan and Services Expected Discharge Plan: [place]   Discharge Planning Services: CM Consult Post Acute Care Choice: [place] arrangements for the past 2 months: [place]                   DME Agency: [employer] Date DME Agency Contacted: 09/10/22 Time DME Agency Contacted: [id number] Representative spoke with at DME Agency: [place]: [place] Date [place]: 09/10/22 Time [place]: 55 Representative spoke with at [place]: Adela Lank  Prior Living Arrangements/Services Living arrangements for the past 2 months: [place] Lives with:: Relatives Patient language and need for interpreter reviewed:: Yes Do you feel safe going back to the place where you live?: Yes               Activities of Daily Living Home Assistive Devices/Equipment: None ADL Screening (condition at time of admission) Patient's cognitive ability adequate to safely complete daily activities?: Yes Is the patient deaf or have difficulty hearing?:  No Does the patient have difficulty seeing, even when wearing glasses/contacts?: No Does the patient have difficulty concentrating, remembering, or making decisions?: No Patient able to express need for assistance with ADLs?: Yes Does the patient have difficulty dressing or bathing?: No Independently performs ADLs?: Yes (appropriate for developmental age) Does the patient have difficulty walking or climbing stairs?: No Weakness of Legs: None Weakness of Arms/Hands: None  Permission Sought/Granted Permission sought to share information with : Case Manager, Family Supports, PCP    Share Information with NAME: Makana Rostad     Permission granted to share info w Relationship: cousin  Permission granted to share info w Contact Information: [phone]  Emotional Assessment Appearance:: Appears stated age Attitude/Demeanor/Rapport: Engaged Affect (typically observed): Accepting     Psych Involvement: No (comment)  Admission diagnosis:  Paroxysmal ventricular tachycardia (HCC) [I47.29] Ventricular tachyarrhythmia ([place]) [I47.20] Patient Active Problem List   Diagnosis Date Noted   Paroxysmal atrial fibrillation (HCC)    Nausea and vomiting    Paroxysmal ventricular tachycardia ([place]) 08/24/2022   VT (ventricular tachycardia) ([place]) 08/16/2022   Chronic obstructive pulmonary disease, unspecified COPD type ([place]) 06/18/2022   Acute respiratory failure with hypoxia ([place]) 05/28/2022   AICD discharge 05/25/2022   Seasonal and perennial allergic rhinitis 11/12/2021   Moderate persistent asthma, uncomplicated 59/56/3875   STD exposure 08/27/2021   ESRD on dialysis ([place]) 02/06/2021   Hypervolemia associated with renal insufficiency 01/12/2021   Angioedema 09/22/2020  Acute on chronic HFrEF (heart failure with reduced ejection fraction) ([place]) 06/14/2020   Secondary hyperparathyroidism of renal origin ([place]) 07/15/2019   Vitamin D deficiency 07/15/2019   CAD (coronary artery disease),  native coronary artery 04/10/2019   Chronic midline low back pain without sciatica 02/17/2018   Ventricular tachycardia ([place]) 03/29/2016   Defibrillator discharge    HFrEF (heart failure with reduced ejection fraction) ([place]) 01/22/2016   Erectile dysfunction 01/22/2016   Allergic rhinitis 01/22/2016   GERD (gastroesophageal reflux disease) 01/22/2016   PROTEINURIA 02/17/2010   Dilated cardiomyopathy ([place]) 02/16/2010   Obstructive sleep apnea 02/16/2010   EXTERNAL HEMORRHOIDS 09/26/2009   Tobacco use disorder in remission 06/03/2009   Essential hypertension 10/01/2008   Hyperlipidemia 07/14/2007   Class 3 severe obesity due to excess calories with serious comorbidity and body mass index (BMI) of 40.0 to 44.9 in adult [facility]) 07/14/2007   Asthma 07/14/2007   PCP:  [name], MD Pharmacy:   [facility] [address] [place] [address] Phone: [phone] Fax: [phone]  [facility] - [place], [address] [place] [address] Phone: [phone] Fax: [phone]     Social Determinants of Health (SDOH) Interventions    Readmission Risk Interventions     No data to display

## 2022-09-10 NOTE — Progress Notes (Signed)
Inpatient Rehab Admissions Coordinator:   Spoke with patient regarding therapy's updated discharge recommendations to home with [facility] services. Patient is in agreement with this plan. I will sign off at this time.   Rehab Admissons Coordinator [place], [place], [place] [phone]

## 2022-09-10 NOTE — Progress Notes (Signed)
Electrophysiology Rounding Note  [name] Date of Encounter: 09/10/2022  Primary Cardiologist: Loralie Champagne, MD Electrophysiologist: Cristopher Peru, MD   Subjective   NAEO. Wants to go home.   Inpatient Medications    Scheduled Meds:  amiodarone  400 mg Oral TID   apixaban  5 mg Oral BID   atorvastatin  80 mg Oral QHS   carvedilol  12.5 mg Oral BID WC   Chlorhexidine Gluconate Cloth  6 each Topical Q0600   Chlorhexidine Gluconate Cloth  6 each Topical Q0600   cholecalciferol  5,000 Units Oral Daily   melatonin  3 mg Oral QHS   mexiletine  300 mg Oral Q12H   multivitamin  1 tablet Oral QHS   nystatin  5 mL Oral QID   pantoprazole  40 mg Oral Daily   polyethylene glycol  17 g Oral Daily   [START ON 09/11/2022] predniSONE  30 mg Oral Q breakfast   scopolamine  1 patch Transdermal Q72H   sodium chloride flush  10-40 mL Intracatheter Q12H   sulfamethoxazole-trimethoprim  1 tablet Oral Once per day on Mon Wed Fri   Continuous Infusions:  sodium chloride Stopped (08/26/22 1922)   PRN Meds: acetaminophen, albuterol, fentaNYL (SUBLIMAZE) injection, heparin, heparin, LORazepam, mouth rinse, prochlorperazine, sodium chloride flush   Vital Signs    Vitals:   09/09/22 1939 09/09/22 2300 09/10/22 0339 09/10/22 0613  BP: (!) 104/54  120/71   Pulse: 64  66   Resp: 19  20   Temp: 98.7 F (37.1 C) 98.5 F (36.9 C) 98.7 F (37.1 C)   TempSrc: Oral Oral Oral   SpO2: 96%  95%   Weight:    111.5 kg  Height:        Intake/Output Summary (Last 24 hours) at 09/10/2022 0808 Last data filed at 09/09/2022 2343 Gross per 24 hour  Intake 316.68 ml  Output 900 ml  Net -583.32 ml   Filed Weights   09/09/22 0730 09/09/22 1215 09/10/22 0613  Weight: 116.3 kg 112.6 kg 111.5 kg    Physical Exam    GEN- The patient is well appearing, alert and oriented x 3 today.   Head- normocephalic, atraumatic Eyes-  Sclera clear, conjunctiva pink Ears- hearing  intact Oropharynx- clear Neck- supple Lungs- Clear to ausculation bilaterally, normal work of breathing Heart- Regular rate and rhythm, no murmurs, rubs or gallops GI- soft, NT, ND, + BS Extremities- no clubbing or cyanosis. No edema Skin- no rash or lesion Psych- euthymic mood, full affect Neuro- strength and sensation are intact  Labs    CBC Recent Labs    09/09/22 0459 09/10/22 0507  WBC 10.8* 8.5  NEUTROABS 8.9* 7.4  HGB 9.9* 10.3*  HCT 28.8* 29.2*  MCV 96.0 95.7  PLT 222 852   Basic Metabolic Panel Recent Labs    09/09/22 0459 09/10/22 0507  NA 133* 136  K 4.1 4.2  CL 93* 98  CO2 22 23  GLUCOSE 117* 104*  BUN 88* 62*  CREATININE 9.07* 7.45*  CALCIUM 8.3* 8.2*  MG 2.4 2.0   Liver Function Tests Recent Labs    09/08/22 1046  AST 13*  ALT 19  ALKPHOS 55  BILITOT 0.4  PROT 5.6*  ALBUMIN 2.6*   No results for input(s): "LIPASE", "AMYLASE" in the last 72 hours. Cardiac Enzymes No results for input(s): "CKTOTAL", "CKMB", "CKMBINDEX", "TROPONINI" in the last 72 hours.   Telemetry    NSR 60-70s (personally  reviewed)  Radiology    No results found.  Patient Profile     CHARLS CUSTER is a 61 y.o. male with a hx of  hx of VF arrest (2015), CAD (PCI in 2005, unknown details), ICM,. Chronic CHF (systolic), ESRF on HD , OSA w/CPAP, HTN, HLD, recurrent angio edema who was admitted for another VT storm.    DNR/DNI  Assessment & Plan    1.  VT storm / refractory VT Was shocked 9/24 by both sICD and external defib. Doing well -- minimal, brief NSVT but having frequent atrial and ventricular ectopy. SICD Battery ~16% as of 10/2. Received additional appropriate shock 10/3 for VT. Discussed option of tx to [facility] for high risk VT ablation, patient is not agreeable to risk of procedure at this time. Palliative care has been consulted, appreciate recs. Now DNR/DNI with ICD off Continue eliquis Continue amiodarone 400 mg TID x 2 weeks, then 400 mg BID.   Continue coreg  Continue mexilitine '300mg'$  BID - Failed quinidine    2. Chronic systolic CHF AHF team is on board -Coronary angiography in 07/2021 showed no significant CAD, and also did not appear to show prior "stent" which had been reported.  -R/LHC 08/25/2022 with Moderate non obstructive CAD and elevated filling pressures as above.  -PYP scan negative  -Had been planned for PET as outpatient for possible Sarcoid.  -ECG with IVCD 150 msec, thought not to be good candidate for CRT upgrade (not true LBBB) Off pressors. Discussed personally with Dr. Aundra Dubin who is also OK with pt going home today if otherwise stable.  Started on steroids if perhaps/possible sarcoid playing a role   3. ESRD Nephrology following.  Back on iHD Next due tomorrow.    4. Acute hypoxemic respiratory failure Extubated 9/28 Appreciate HF and CCM care throughout stay.    5. AFib/flutter  AC - Continue Eliquis   Dr. Lovena Le has seen.   Pt has finished ABx. OT recs changed to HHPT.   Palliative care following and he is now DNR/DNI with ICD deactivated.   He wishes to go home.   Will have PT finalize their recommendation today and make sure palliative care aware.    Full note pending disposition. Possible home this afternoon.    For questions or updates, please contact [place] Please consult [email or web address] for contact info under Cardiology/STEMI.  Signed, Shirley Friar, PA-C  09/10/2022, 8:08 AM

## 2022-09-10 NOTE — Progress Notes (Signed)
This chaplain is present with the Pt. for F/U spiritual care.   The chaplain listened reflectively as the Pt. talked through his discharge to the Pt. Cousin-Brenda's home and the questions Hassan Rowan shared with the Pt.  The chaplain understands the plans for d/c to Brenda's home remain the same.  The chaplain updated the RN-Laura of the conversations with the Pt.  The Pt. remains hopeful the time at Brenda's home will be short and he can pursue his own housing.  This chaplain is available for F/U spiritual care.  Chaplain Sallyanne Kuster [phone]

## 2022-09-10 NOTE — Progress Notes (Addendum)
Patient ID: Victor Thompson, male   DOB: 1961/11/06, 61 y.o.   MRN: [id number]   Advanced Heart Failure Rounding Note  PCP-Cardiologist: Loralie Champagne, MD   Subjective:    Admitted with VT storm.   9/24 multiple repeated shocks for VT monomorphic, reintubated, rebolused with amio, quinidine now on hold, started on procainamide.   9/25 Given concern for possible cardiac sarcoid, started empirically on prednisone. IV procainamide discontinued by EP  9/27 started on ceftriaxone for suspected aspiration PNA. Mexiletine started for VT suppression   9/28 Extubated 10/3 Recurrent VT, ICD shock  Now DNR. ICD deactivated. No further VT, remains in NSR on po amiodarone + mexiletine + Coreg.   He has finished cefepime. Trach aspirate with MSSA and Morganella.   Had iHD yesterday. Wt down 3 lb. CVP 4. No dyspnea. Feels ok. No complaints.   HiRes Chest CT (10/02):  No findings to suggest pulmonary sarcoidosis, linear opacities b/l LL and posterior LUL with mild ground-glass opacities (favor resolving infection or aspiration)  RHC/LHC (9/20):  Coronary Findings  Diagnostic Dominance: Right Left Anterior Descending  Ost LAD to Prox LAD lesion is 40% stenosed.    First Diagonal Branch  1st Diag lesion is 60% stenosed.    Left Circumflex  Mid Cx lesion is 30% stenosed.    Right Coronary Artery  Prox RCA lesion is 30% stenosed.    Intervention   No interventions have been documented.   Right Heart  Right Heart Pressures RHC Procedural Findings: Hemodynamics (mmHg) RA mean 14 RV 52/15 PA 53/27, mean 37 PCWP mean 26 LV 97/29 AO 103/64  Oxygen saturations: PA 76% AO 100%  Cardiac Output (Fick) 8.98  Cardiac Index (Fick) 3.96 PVR 2.8 WU    Objective:   Weight Range: 111.5 kg Body mass index is 38.5 kg/m.   Vital Signs:   Temp:  [97.7 F (36.5 C)-98.8 F (37.1 C)] 98.7 F (37.1 C) (10/06 0339) Pulse Rate:  [62-71] 66 (10/06 0339) Resp:  [15-27] 20 (10/06 0339) BP:  (92-152)/(46-118) 120/71 (10/06 0339) SpO2:  [93 %-100 %] 95 % (10/06 0339) Weight:  [111.5 kg-116.3 kg] 111.5 kg (10/06 0613) Last BM Date : 09/09/22  Weight change: Filed Weights   09/09/22 0730 09/09/22 1215 09/10/22 0613  Weight: 116.3 kg 112.6 kg 111.5 kg    Intake/Output:   Intake/Output Summary (Last 24 hours) at 09/10/2022 0714 Last data filed at 09/09/2022 2343 Gross per 24 hour  Intake 449.28 ml  Output 900 ml  Net -450.72 ml      Physical Exam   CVP 4  General:  Well appearing. No respiratory difficulty HEENT: normal Neck: supple. no JVD. Carotids 2+ bilat; no bruits. No lymphadenopathy or thyromegaly appreciated. Cor: PMI nondisplaced. Regular rate & rhythm. No rubs, gallops or murmurs. Lungs: clear Abdomen: soft, nontender, nondistended. No hepatosplenomegaly. No bruits or masses. Good bowel sounds. Extremities: no cyanosis, clubbing, rash, edema Neuro: alert & oriented x 3, cranial nerves grossly intact. moves all 4 extremities w/o difficulty. Affect pleasant.   Telemetry   NSR 60s, occasional PVCs. No VT (personally reviewed).   EKG    N/A  Labs    CBC Recent Labs    09/09/22 0459 09/10/22 0507  WBC 10.8* 8.5  NEUTROABS 8.9* 7.4  HGB 9.9* 10.3*  HCT 28.8* 29.2*  MCV 96.0 95.7  PLT 222 387   Basic Metabolic Panel Recent Labs    09/07/22 0800 09/08/22 0435 09/09/22 0459 09/10/22 0507  NA 136   < >  133* 136  K 3.6   < > 4.1 4.2  CL 97*   < > 93* 98  CO2 19*   < > 22 23  GLUCOSE 157*   < > 117* 104*  BUN 109*   < > 88* 62*  CREATININE 8.93*   < > 9.07* 7.45*  CALCIUM 8.9   < > 8.3* 8.2*  MG 3.1*   < > 2.4 2.0  PHOS 4.8*  --   --   --    < > = values in this interval not displayed.   Liver Function Tests Recent Labs    09/07/22 0800 09/08/22 1046  AST  --  13*  ALT  --  19  ALKPHOS  --  55  BILITOT  --  0.4  PROT  --  5.6*  ALBUMIN 2.8* 2.6*   No results for input(s): "LIPASE", "AMYLASE" in the last 72 hours. Cardiac  Enzymes No results for input(s): "CKTOTAL", "CKMB", "CKMBINDEX", "TROPONINI" in the last 72 hours.  BNP: BNP (last 3 results) Recent Labs    05/25/22 0705 08/16/22 0627 08/16/22 1257  BNP 1,355.4* QUANTITY NOT SUFFICIENT, UNABLE TO PERFORM TEST 399.2*    ProBNP (last 3 results) No results for input(s): "PROBNP" in the last 8760 hours.   D-Dimer No results for input(s): "DDIMER" in the last 72 hours. Hemoglobin A1C No results for input(s): "HGBA1C" in the last 72 hours. Fasting Lipid Panel No results for input(s): "CHOL", "HDL", "LDLCALC", "TRIG", "CHOLHDL", "LDLDIRECT" in the last 72 hours.  Thyroid Function Tests No results for input(s): "TSH", "T4TOTAL", "T3FREE", "THYROIDAB" in the last 72 hours.  Invalid input(s): "FREET3"   Other results:   Imaging    No results found.   Medications:     Scheduled Medications:  amiodarone  400 mg Oral TID   apixaban  5 mg Oral BID   atorvastatin  80 mg Oral QHS   carvedilol  12.5 mg Oral BID WC   Chlorhexidine Gluconate Cloth  6 each Topical Q0600   Chlorhexidine Gluconate Cloth  6 each Topical Q0600   cholecalciferol  5,000 Units Oral Daily   melatonin  3 mg Oral QHS   mexiletine  300 mg Oral Q12H   multivitamin  1 tablet Oral QHS   nystatin  5 mL Oral QID   pantoprazole  40 mg Oral Daily   polyethylene glycol  17 g Oral Daily   predniSONE  40 mg Oral Q breakfast   scopolamine  1 patch Transdermal Q72H   sodium chloride flush  10-40 mL Intracatheter Q12H   sulfamethoxazole-trimethoprim  1 tablet Oral Once per day on Mon Wed Fri    Infusions:  sodium chloride Stopped (08/26/22 1922)    PRN Medications: acetaminophen, albuterol, fentaNYL (SUBLIMAZE) injection, heparin, heparin, LORazepam, mouth rinse, prochlorperazine, sodium chloride flush    Patient Profile   Victor Thompson is 61 year old with a history of VT, ESRD, CAD, ischemic CMP, and ESRD. He had PCI in 1610, uncertain what vessel was involved. Echo 2/22:  EF 25-30% with moderate LV dilation and normal RV.  He had VT arrest in 2015, has [employer] subcutaneous ICD.  [name] in 2/22.  LHC/RHC was done in 8/22, showing no significant CAD and stable hemodynamics.   Admitted with refractory VT complicated by shock.    Assessment/Plan   1. VT storm: Has has a Animator ICD.  Refractory monomorphic VT. Multiple shocks.  Later alternating NSR with a slow  wide complex AIVR-like rhythm. Recurrent monomorphic VT 9/24 w/ repeated shocks>>re-intubated, rebolused with amio, quinidine discontinued, started on procainamide, now off.  Given concern for possible cardiac sarcoid, started empirically on prednisone, currently on 40 mg daily. HiRes Chest CT this admit with no evidence of pulmonary sarcoid. Cath this admit with nonobstructive CAD. Echo EF 20-25%, GIIIDD (restrictive), RV normal. Recurrent VT on 10/3 with ICD shock.  No further VT over the last day.  He po amiodarone + mexiletine 300 bid + Coreg 12.5 mg bid.   - EP has decided against VT ablation, do not think it would be likely to be successful and very high risk. With VT again 10/3, I offered transfer to [facility] for another opinion on ablation (and potentially epicardial ablation) but with the caveat that it is still very high risk and of unlikely success.  He was not interested in this.   - Continue amiodarone po tid  - Continue mexiletine 300 mg bid.    - Continue Coreg 12.5 mg bid, can increase in future if BP tolerates.  - Long term very concerning, not candidate for LVAD with ESRD.   - Continue empiric prednisone 40 mg daily with Bactrim SS three times/week.  He was on Solumedrol when nauseated.  - Would like eventual cardiac PET for sarcoidosis but can't do at this center yet.  Will need to be at [facility] after discharge.  2. Acute on chronic systolic CHF with cardiogenic shock: In setting of incessant VT.  H/o NICM, echo this admission with EF 20-25%, normal RV function, mild-moderate Victor, IVC  dilated.  At last admission, unable to complete cardiac MRI due to artifact from [facility] ICD.  PYP scan was negative.  RHC this admit showed volume overload>>CRRT for volume removal.  Off CRRT. Off pressors.  CVP 4 today.  - Volume management via HD.  - Titrating Coreg as BP tolerates.    3. CAD: ?PCI in 2005, unsure what vessel.  Coronary angiography in 8/22 showed no significant CAD and also does not appear to show prior stent so h/o CAD is questionable. LHC this admission with nonobstructive CAD.  - Continue statin. - No ASA with anticoagulation.  4. ESRD: Since 2/22. TTS HD.  - Now back on iHD.   - Nephrology following. 5. ID: Suspect aspiration PNA.  Chest CT suspicious for this as well. Growing MSSA and Morganella from sputum.  He has completed course of cefepime.  6. Atrial fibrillation: Paroxysmal, noted this admission. Has also had AFL.  Back to NSR this morning.   - Remains on amiodarone, dosing per EP.  - Continue Eliquis.  7. Code status: Now DNR/DNI and ICD off given intractable VT with no good long-term options.  Will continue aggressive medical management but no plan for ablation or further invasive intervention.   Mobilize. PT/OT recommending SNF for rehab. CIR following for possible admission pending insurance auth.    Lyda Jester, PA-C 09/10/2022 7:14 AM  Patient seen with PA, agree with the above note.   No further VT.  Was up and about in his room, OT now recommending HHPT.  CVP 4. HD yesterday with no problems.   No dyspnea or chest pain.   General: NAD Neck: No JVD, no thyromegaly or thyroid nodule.  Lungs: Clear to auscultation bilaterally with normal respiratory effort. CV: Nondisplaced PMI.  Heart regular S1/S2, no S3/S4, no murmur.  No peripheral edema.   Abdomen: Soft, nontender, no hepatosplenomegaly, no distention.  Skin: Intact without lesions or rashes.  Neurologic: Alert and oriented x 3.  Psych: Normal affect. Extremities: No clubbing or cyanosis.   HEENT: Normal.   Patient has been stable for the last day.  Would continue current anti-arrhythmic regimen, weaning of amiodarone per EP.    I will decrease his empiric prednisone to 30 mg daily.  He will need to continue Bactrim prophylaxis tiw while on high dose prednisone.  We will arrange for cardiac PET at [facility] as soon as possible.  If no evidence for sarcoidosis, wean off prednisone.   He is DNR with ICD deactivated.  He wants to continue full scope of medical therapy however.   Walk with PT this morning.  If he does well, can go home.   Loralie Champagne 09/10/2022 8:11 AM

## 2022-09-10 NOTE — Progress Notes (Signed)
D/C order noted. Pt to d/c to home today. Contacted [facility] and spoke to [place]. Clinic advised pt will d/c today and resume care tomorrow.   Melven Sartorius Renal Navigator [phone]

## 2022-09-10 NOTE — Discharge Summary (Addendum)
ELECTROPHYSIOLOGY DISCHARGE SUMMARY    Patient ID: Victor Thompson,  MRN: [id number], DOB/AGE: Jan 08, 1961 61 y.o.  Admit date: 08/24/2022 Discharge date: 09/10/2022  Primary Care Physician: [name], MD  Primary Cardiologist: Loralie Champagne, MD  Electrophysiologist: Dr. Lovena Le   Primary Discharge Diagnosis:  VT storm Refractory VT End stage systolic CHF  Secondary Discharge Diagnosis:  ESRD on HD Acute hypoxemic respiratory failure Atrial fibrillation, paroxysmal Atrial flutter, paroxysmal Suspected sarcoidosis Long-term use of steroids.   Procedures This Admission:  [facility] 08/25/2022   1st Diag lesion is 60% stenosed.   Ost LAD to Prox LAD lesion is 40% stenosed.   Mid Cx lesion is 30% stenosed.   Prox RCA lesion is 30% stenosed.   1. Elevated right and left heart filling pressures with pulmonary venous hypertension.  2. Nonobstructive CAD.   Echocardiogram 08/25/2022 LVEF 20-25%, Grade III DD, Mod LAE, mild RAE, mild to mod MR, CVP 15, cannot exclude small PFO.   Brief HPI: Victor Thompson is a 61 y.o. male with a history of hx of VF arrest (2015), CAD (PCI in 2005, unknown details), ICM,. Chronic CHF (systolic), ESRF on HD , OSA w/CPAP, HTN, HLD, recurrent angio edema who was admitted for another VT storm.    Hospital Course:  The patient was admitted with recurrent VT storm with > 12 shocks from his ICD.     Pt presented 9/19 due to multiple shocks from his ICD. In the ED he was shocked > 10 times. Magnet was applied, and discussions involved intubation and sedation to stabilize pt.  Unfortunately, he then went into VF with syncope requiring external cardioversion.  Pt was intubated, sedated, and paralyzed.  He was loaded on amiodarone and due to reports of allergy to lidocaine was tried on quinidine.     Quinidine was ultimately felt to be ineffective, and transitioned to procainamide. This was also felt to be ineffective, and with no oral equivalent  available felt to be a poor longer term option.  Other significant events included:  9/24 multiple repeated shocks for VT monomorphic, reintubated, rebolused with amio.   9/25 Given concern for possible cardiac sarcoid, started empirically on prednisone. IV procainamide discontinued by EP  9/27 started on ceftriaxone for suspected aspiration PNA. Mexiletine started for VT suppression   9/28 Extubated 10/3 Recurrent VT, ICD shock. Amiodarone and mexiletine both increased. 10/4 Palliative care consulted due to continued refractory VT.  Pt ultimately decided to become DNR/DNI and ICD was deactivated. He understands risk of mortality, and his primarily goal is to get home and to manage his affairs.   PT/OT had been following, and as he stabilized recommendation shifted from CIR to [place].   On 09/10/2022 pt was examined and determined to be stable enough for discharge to home on po meds per his wishes.   He will have [place] and PT services engaged and can engage palliative/hospice services as warranted.     He will continue Eliquis for stroke prophylaxis with AF/AFL this admission.  He will continue prednisone at 30 mg daily for now, along with Bactrim for PJP prophylaxis.   Pending course, will continue to try and get PET scan performed as outpatient to further assess if truly sarcoid and if this is contributing.   He will be seen in close follow up as above.  Reviewed [place] driving restrictions of no driving x 6 months post ICD shocks.  Physical Exam: Vitals:   09/10/22 9323 09/10/22 5573 09/10/22 2202 09/10/22  1112  BP:  136/80 136/80 129/67  Pulse:  78    Resp:   20 (!) 22  Temp:      TempSrc:      SpO2:      Weight: 111.5 kg     Height:       Labs:   Lab Results  Component Value Date   WBC 8.5 09/10/2022   HGB 10.3 (L) 09/10/2022   HCT 29.2 (L) 09/10/2022   MCV 95.7 09/10/2022   PLT 237 09/10/2022    Recent Labs  Lab 09/08/22 1046 09/09/22 0459 09/10/22 0507  NA  --    < >  136  K  --    < > 4.2  CL  --    < > 98  CO2  --    < > 23  BUN  --    < > 62*  CREATININE  --    < > 7.45*  CALCIUM  --    < > 8.2*  PROT 5.6*  --   --   BILITOT 0.4  --   --   ALKPHOS 55  --   --   ALT 19  --   --   AST 13*  --   --   GLUCOSE  --    < > 104*   < > = values in this interval not displayed.     Discharge Medications:  Allergies as of 09/10/2022       Reactions   Ace Inhibitors Swelling   Swelling of the tongue   Influenza Vaccines Hives, Swelling   SWELLING REACTION UNSPECIFIED    Ketorolac Swelling   SWELLING REACTION UNSPECIFIED    Lidocaine Swelling   TONGUE SWELLS   Lisinopril Swelling   TONGUE SWELLING Pt reported problem with a BP med which sounded like  lisinopril  But as of 09/19/06,pt had tolerated altace without problem   Penicillins Swelling   TONGUE SWELLS Has patient had a PCN reaction causing immediate rash, facial/tongue/throat swelling, SOB or lightheadedness with hypotension: Yes Has patient had a PCN reaction causing severe rash involving mucus membranes or skin necrosis: No Has patient had a PCN reaction that required hospitalization No Has patient had a PCN reaction occurring within the last 10 years: Yes If all of the above answers are "NO", then may proceed with Cephalosporin use.        Medication List     STOP taking these medications    aspirin EC 81 MG tablet       TAKE these medications    albuterol 108 (90 Base) MCG/ACT inhaler Commonly known as: VENTOLIN HFA Inhale 2 puffs into the lungs every 4 (four) hours as needed for wheezing or shortness of breath.   albuterol (2.5 MG/3ML) 0.083% nebulizer solution Commonly known as: PROVENTIL TAKE 3 MLS (2.5 MG TOTAL) BY NEBULIZATION EVERY 6 (SIX) HOURS AS NEEDED FOR WHEEZING OR SHORTNESS OF BREATH.   amiodarone 400 MG tablet Commonly known as: PACERONE Take 1 tablet (400 mg total) by mouth 3 (three) times daily for 14 days, THEN 1 tablet (400 mg total) 2 (two) times  daily. Start taking on: September 10, 2022 What changed:  medication strength See the new instructions.   apixaban 5 MG Tabs tablet Commonly known as: ELIQUIS Take 1 tablet (5 mg total) by mouth 2 (two) times daily.   atorvastatin 80 MG tablet Commonly known as: LIPITOR Take 1 tablet (80 mg total) by mouth at bedtime.  Auryxia 1 GM 210 MG(Fe) tablet Generic drug: ferric citrate Take 1 tablet (210 mg total) by mouth 3 (three) times daily.   calcium acetate 667 MG capsule Commonly known as: PHOSLO Take 2 capsule by mouth three times a day with meals What changed:  how much to take when to take this   Calmol-4 76-10 % Supp Use as directed once to twice daily What changed:  how much to take how to take this when to take this reasons to take this   carvedilol 12.5 MG tablet Commonly known as: COREG Take 1 tablet (12.5 mg total) by mouth 2 (two) times daily with a meal. What changed:  medication strength how much to take when to take this   cetirizine 10 MG tablet Commonly known as: ZYRTEC Take 1 tablet (10 mg total) by mouth 2 (two) times daily. What changed:  when to take this reasons to take this   docusate sodium 100 MG capsule Commonly known as: Colace Take 1 capsule (100 mg total) by mouth 2 (two) times daily.   EPINEPHrine 0.3 mg/0.3 mL Soaj injection Commonly known as: EPI-PEN Inject 0.3 mg into the muscle once as needed for up to 2 doses (if worsening tongue swelling, SOB, hypoxia, or other concerns for progressive anaphylaxis).   ethyl chloride spray SPRAY A SMALL AMOUNT THREE TIMES A WEEK JUST PRIOR TO NEEDLE INSERTION What changed:  how much to take how to take this when to take this   fluticasone 50 MCG/ACT nasal spray Commonly known as: FLONASE Place 1 spray into both nostrils as needed for allergies or rhinitis.   hydrOXYzine 25 MG tablet Commonly known as: ATARAX Take 1-2 tablets (25-50 mg total) by mouth at bedtime as needed for  anxiety/insomnia.   mexiletine 150 MG capsule Commonly known as: MEXITIL Take 2 capsules (300 mg total) by mouth every 12 (twelve) hours.   pantoprazole 40 MG tablet Commonly known as: PROTONIX Take 1 tablet (40 mg total) by mouth daily. Start taking on: September 11, 2022   polyethylene glycol 17 g packet Commonly known as: MiraLax Take 17 g by mouth daily. Titrate up as needed   predniSONE 10 MG tablet Commonly known as: DELTASONE Take 3 tablets (30 mg total) by mouth daily with breakfast. Start taking on: September 11, 2022   sulfamethoxazole-trimethoprim 400-80 MG tablet Commonly known as: BACTRIM Take 1 tablet by mouth 3 (three) times a week. Will take as long as you are on prednisone Start taking on: September 13, 2022   Symbicort 80-4.5 MCG/ACT inhaler Generic drug: budesonide-formoterol INHALE TWO PUFFS INTO THE LUNGS TWICE DAILY (BULK) What changed: See the new instructions.   Velphoro 500 MG chewable tablet Generic drug: sucroferric oxyhydroxide Chew 1,000 mg by mouth 3 (three) times daily.   Vitamin D-3 125 MCG (5000 UT) Tabs Take 5,000 Units by mouth daily.               Durable Medical Equipment  (From admission, onward)           Start     Ordered   09/10/22 1401  For home use only DME Walker rolling  Once       Question Answer Comment  Walker: With 5 Inch Wheels   Patient needs a walker to treat with the following condition Chronic systolic heart failure ([place])      09/10/22 1400   09/10/22 1101  For home use only DME 4 wheeled rolling walker with seat  Once  Question Answer Comment  Patient needs a walker to treat with the following condition Heart failure [facility])   Patient needs a walker to treat with the following condition ESRD (end stage renal disease) ([place])      09/10/22 1100            Disposition:    Follow-up Information     [place] Follow up.   Why: [place] will call with  appt Contact information: [place]        [place] Follow up.   Specialty: Cardiology Why: 09/24/22 at 9:30 AM   The Advanced [facility] Failure [facility] at [facility], [place] (Dr. Claris Gladden office) Contact information: [address] [id number] [place]        Evans Lance, MD Follow up.   Specialty: Cardiology Why: on 10/20 at 4 pm for post hospital follow up Contact information: [address] [place] [address] [phone]                 Duration of Discharge Encounter: Greater than 30 minutes including physician time.  Signed, Shirley Friar, PA-C  09/10/2022 2:26 PM  EP Attending  Patient seen and examined. Agree with the findings as noted above. The patient is stable for DC. He has refused transfer to [facility] to consider salvage VT ablation. He has maintained NSR on high dose amiodarone. He will be discharged with followup as above. Long term and short term prognosis are both poor.   Carleene Overlie Shakai Dolley,MD

## 2022-09-10 NOTE — Care Management Important Message (Signed)
Important Message  Patient Details  Name: Victor Thompson MRN: [id number] Date of Birth: [birth year]   Medicare Important Message Given:  Yes     [name] 09/10/2022, 2:36 PM

## 2022-09-10 NOTE — Progress Notes (Signed)
Left IJ HD cath removed intact. Manual pressure held at site. Site covered with vaseline gauze, gauze, and tape. No bleeding, swelling or redness at site. Instructed pt to lay flat for 30 min and leave dressing on for 24 hrs. Pt states understanding and tolerated procedure well.

## 2022-09-10 NOTE — Consult Note (Signed)
   [facility] CM Inpatient Consult   09/10/2022  NASHTON BELSON Jul 07, 1961 [id number]  [facility] [facility] [ACO] Patient: [employer]  Primary Care Provider:  [name], MD, [facility],   *Follow up review for LLOS and progress with extreme high risk scores for unplanned readmission risk.  Met with the patient at the bedside.  Patient states it is alright ongoing follow up with [facility] RNCM.    Plan: Spoke with Inpatient Transition Of Care [TOC] team member to make aware that [facility] Management following as well  Of note, [facility] Care Management services does not replace or interfere with any services that are needed or arranged by inpatient [facility] care management team.  Plan: Will update [facility] RN of current disposition for home with [facility].   For additional questions or referrals please contact:   Natividad Brood, RN BSN [facility]  [phone] business mobile phone Toll free office [phone]  *[place]  [phone] Fax number: [phone] [email or web address]

## 2022-09-10 NOTE — Plan of Care (Signed)
  Problem: Activity: Goal: Ability to tolerate increased activity will improve Outcome: Progressing   Problem: Cardiac: Goal: Ability to achieve and maintain adequate cardiovascular perfusion will improve Outcome: Progressing   Problem: Education: Goal: Knowledge of General Education information will improve Description: Including pain rating scale, medication(s)/side effects and non-pharmacologic comfort measures Outcome: Progressing   Problem: Clinical Measurements: Goal: Ability to maintain clinical measurements within normal limits will improve Outcome: Progressing Goal: Will remain free from infection Outcome: Progressing

## 2022-09-10 NOTE — Progress Notes (Signed)
Physical Therapy Treatment Patient Details Name: Victor Thompson MRN: [id number] DOB: 1961/11/15 Today's Date: 09/10/2022   History of Present Illness Pt is a 61 y.o. male admitted from HD on 08/24/22 with c/o palpitations, feeling faint. In ED, recurrent episodes of VT with repeated ICD discharges. ETT 9/19-9/22. R/LHC on 9/20. CRRT 9/20-9/22, 9/26-9/29. 9/24 VT and reintubated. extubated 9/28. 10/3 VT with ICD shock. 10/4 ICD turned off. PMH includes CHF, AICD, CHF, CAD, ESRD (on HD), HTN, MI, HLD, OSA (uses CPAP), obesity, asthma.    PT Comments    Pt demonstrated mod I bed mobility. Supervision for transfers, and min guard assist ambulation 180' with RW. SpO2 93% on RA. HR 87. Pt reported mild fatigue upon return to room. No SOB/DOE noted. Pt assisted to bathroom at end of session. RN notified that pt remained in bathroom upon therapist's exit.    Recommendations for follow up therapy are one component of a multi-disciplinary discharge planning process, led by the attending physician.  Recommendations may be updated based on patient status, additional functional criteria and insurance authorization.  Follow Up Recommendations  Home health PT     Assistance Recommended at Discharge Frequent or constant Supervision/Assistance  Patient can return home with the following A little help with walking and/or transfers;A little help with bathing/dressing/bathroom;Assistance with cooking/housework;Help with stairs or ramp for entrance;Assist for transportation   Equipment Recommendations  Rolling walker (2 wheels)    Recommendations for Other Services       Precautions / Restrictions Precautions Precautions: Fall;Other (comment) Precaution Comments: goes into VT; No ICD - have chair follow     Mobility  Bed Mobility Overal bed mobility: Modified Independent             General bed mobility comments: +rail, increased time    Transfers Overall transfer level: Needs  assistance Equipment used: Rolling walker (2 wheels) Transfers: Sit to/from Stand Sit to Stand: Supervision           General transfer comment: supervision for safety    Ambulation/Gait Ambulation/Gait assistance: Min guard Gait Distance (Feet): 180 Feet Assistive device: Rolling walker (2 wheels) Gait Pattern/deviations: Step-through pattern, Decreased stride length Gait velocity: decreased Gait velocity interpretation: <1.31 ft/sec, indicative of household ambulator   General Gait Details: No chair follow this session. HR 87. SpO2 93% on RA. Pt reports mild fatigue upon return to room.   Stairs             Wheelchair Mobility    Modified Rankin (Stroke Patients Only)       Balance Overall balance assessment: Needs assistance Sitting-balance support: No upper extremity supported, Feet supported Sitting balance-Leahy Scale: Good     Standing balance support: Bilateral upper extremity supported, During functional activity Standing balance-Leahy Scale: Fair Standing balance comment: RW for amb                            Cognition Arousal/Alertness: Awake/alert Behavior During Therapy: WFL for tasks assessed/performed Overall Cognitive Status: Within Functional Limits for tasks assessed                                          Exercises      General Comments        Pertinent Vitals/Pain Pain Assessment Pain Assessment: No/denies pain    Home Living  Prior Function            PT Goals (current goals can now be found in the care plan section) Acute Rehab PT Goals Patient Stated Goal: home Progress towards PT goals: Progressing toward goals    Frequency    Min 3X/week      PT Plan Discharge plan needs to be updated    Co-evaluation              AM-PAC PT "6 Clicks" Mobility   Outcome Measure  Help needed turning from your back to your side while in a flat bed  without using bedrails?: None Help needed moving from lying on your back to sitting on the side of a flat bed without using bedrails?: None Help needed moving to and from a bed to a chair (including a wheelchair)?: A Little Help needed standing up from a chair using your arms (e.g., wheelchair or bedside chair)?: A Little Help needed to walk in hospital room?: A Little Help needed climbing 3-5 steps with a railing? : A Lot 6 Click Score: 19    End of Session Equipment Utilized During Treatment: Gait belt Activity Tolerance: Patient tolerated treatment well Patient left: Other (comment);with call bell/phone within reach (in bathrrom) Nurse Communication: Mobility status;Other (comment) (Pt in bathroom for BM.) PT Visit Diagnosis: Other abnormalities of gait and mobility (R26.89);Muscle weakness (generalized) (M62.81)     Time: 3202-3343 PT Time Calculation (min) (ACUTE ONLY): 25 min  Charges:  $Gait Training: 23-37 mins                     Lorrin Goodell, [place]  Office # [phone] Pager [phone]    Lorriane Shire 09/10/2022, 9:45 AM

## 2022-09-11 ENCOUNTER — Telehealth: Payer: Self-pay | Admitting: Nephrology

## 2022-09-11 DIAGNOSIS — N2581 Secondary hyperparathyroidism of renal origin: Secondary | ICD-10-CM | POA: Diagnosis not present

## 2022-09-11 DIAGNOSIS — R52 Pain, unspecified: Secondary | ICD-10-CM | POA: Diagnosis not present

## 2022-09-11 DIAGNOSIS — D631 Anemia in chronic kidney disease: Secondary | ICD-10-CM | POA: Diagnosis not present

## 2022-09-11 DIAGNOSIS — N186 End stage renal disease: Secondary | ICD-10-CM | POA: Diagnosis not present

## 2022-09-11 DIAGNOSIS — Z992 Dependence on renal dialysis: Secondary | ICD-10-CM | POA: Diagnosis not present

## 2022-09-11 DIAGNOSIS — D509 Iron deficiency anemia, unspecified: Secondary | ICD-10-CM | POA: Diagnosis not present

## 2022-09-11 NOTE — Telephone Encounter (Signed)
Transition of Care Contact from [place]  Date of Discharge: 09/10/22 Date of Contact: 09/11/22 Method of contact: phone - attempted  Attempted to contact patient to discuss transition of care from inpatient admission.  Patient did not answer the phone. Will attempt to call again or will follow up at dialysis next week.  Jen Mow, PA-C [facility] Pager: [phone]

## 2022-09-13 ENCOUNTER — Ambulatory Visit: Payer: Self-pay | Admitting: *Deleted

## 2022-09-13 ENCOUNTER — Other Ambulatory Visit: Payer: Self-pay

## 2022-09-13 ENCOUNTER — Telehealth: Payer: Self-pay

## 2022-09-13 ENCOUNTER — Other Ambulatory Visit: Payer: Self-pay | Admitting: Internal Medicine

## 2022-09-13 DIAGNOSIS — I10 Essential (primary) hypertension: Secondary | ICD-10-CM

## 2022-09-13 DIAGNOSIS — I5020 Unspecified systolic (congestive) heart failure: Secondary | ICD-10-CM

## 2022-09-13 DIAGNOSIS — F418 Other specified anxiety disorders: Secondary | ICD-10-CM

## 2022-09-13 DIAGNOSIS — N186 End stage renal disease: Secondary | ICD-10-CM

## 2022-09-13 LAB — AMIODARONE LEVEL
Amiodarone Lvl: 2714 ng/mL — ABNORMAL HIGH (ref 1000–2500)
N-Desethyl-Amiodarone: 845 ng/mL

## 2022-09-13 MED ORDER — HYDROXYZINE HCL 25 MG PO TABS
25.0000 mg | ORAL_TABLET | Freq: Every evening | ORAL | 1 refills | Status: DC | PRN
  Filled 2022-09-13: qty 60, 30d supply, fill #0

## 2022-09-13 NOTE — Telephone Encounter (Signed)
Pt returned a call regarding not having his medications from hospital discharge on 09/10/2022.  He is at [facility] now downstairs.   I could not locate anything in his chart regarding being hospitalized, medications or anything.   I let him know since he is already at [facility] to let them know about the medications so they can further direct him.    He was agreeable to this plan and was on the elevator to the office when we ended the call.

## 2022-09-13 NOTE — Telephone Encounter (Signed)
Call placed to patient and informed him that Dr [name] has sent a prescription for hydroxyzine to [facility].

## 2022-09-13 NOTE — Telephone Encounter (Signed)
Summary: Medication questions   Patient states he was discharged from the hospital on 09/10/22. Patient states he was not given medications that he was taking in the hospital to take home, but they are listed on the paperwork for him to be taking. Please advise.     Attempted to call patient- left message to call office

## 2022-09-13 NOTE — Telephone Encounter (Signed)
Transition Care Management Follow-up Telephone Call  I met with the patient in the clinic this morning Date of discharge and from where: 09/10/2022, [facility] How have you been since you were released from the hospital? He said he is very anxious.  He is fearful of falling asleep, having to have his heart shocked again.  He said he was so anxious he had to leave dialysis early on Saturday.  He is requesting a refill of hydroxyzine. He is afraid of going to HD tomorrow and having to leave early because of his anxiety. I told him that I would let Dr [name] know his concerns and would contact regarding the refill request. I offered the option of speaking with our LCSW but he said he did not want to speak with the LCSW at this time because he did not think it would help.  Any questions or concerns? Yes- anxiety noted above. He said that his CPAP machine is not working correctly. It is from [employer] [facility].  I provided him with the phone number for [employer] to call with his concerns. He also said he is able to take the machine to their retail office in [facility] to have it checked.   Items Reviewed: Did the pt receive and understand the discharge instructions provided? Yes  Medications obtained and verified?  We reviewed the medication list and he is missing atorvastatin and will request a refill at the pharmacy.  He is also missing auryxia and phoslo  and will contact his nephrologist about those refills.  He is requesting a refill of hydroxyzine and Dr [name] has been notified. He has a nebulizer. He said he may try to pre-fill a med box at home.  I explained to him that he can also have a pharmacy bubble pack his medications if he is interested.  He may need to change pharmacies but that is an option for him  Other? No  Any new allergies since your discharge? No  Dietary orders reviewed? No Do you have support at home? Yes   Home Care and Equipment/Supplies: Were home health services ordered?  yes If so, what is the name of the agency? [facility]  Has the agency set up a time to come to the patient's home? Yes- he said they called him and told him that they will be calling back to schedule an appointment Were any new equipment or medical supplies ordered?  Yes: RW What is the name of the medical supply agency? [facility] Were you able to get the supplies/equipment? yes Do you have any questions related to the use of the equipment or supplies? No  Functional Questionnaire: (I = Independent and D = Dependent) ADLs: independent. Ambulating without the RW. Attends HD: T/T/S.    Follow up appointments reviewed:  PCP Hospital f/u appt confirmed? Yes  Scheduled to see Dr [name] - 09/16/2022.  [facility] f/u appt confirmed? Yes  Scheduled to see [facility]- 09/24/2022.  Are transportation arrangements needed? No  If their condition worsens, is the pt aware to call PCP or go to the Emergency Dept.? Yes Was the patient provided with contact information for the PCP's office or ED? Yes Was to pt encouraged to call back with questions or concerns? Yes

## 2022-09-13 NOTE — Consult Note (Signed)
   [facility] CM Inpatient Consult   09/13/2022  KALVEN GANIM 1961/10/21 [id number]  Follow up:  Referral request for Care Coordination follow up support for readmission prevention needs.  Natividad Brood, RN BSN [place]  [phone] business mobile phone Toll free office [phone]  *[place]  [phone] Fax number: [phone] [email or web address]

## 2022-09-14 ENCOUNTER — Other Ambulatory Visit: Payer: Self-pay

## 2022-09-14 ENCOUNTER — Other Ambulatory Visit (HOSPITAL_COMMUNITY): Payer: Self-pay

## 2022-09-14 ENCOUNTER — Telehealth (HOSPITAL_COMMUNITY): Payer: Self-pay

## 2022-09-14 DIAGNOSIS — N2581 Secondary hyperparathyroidism of renal origin: Secondary | ICD-10-CM | POA: Diagnosis not present

## 2022-09-14 DIAGNOSIS — I472 Ventricular tachycardia, unspecified: Secondary | ICD-10-CM | POA: Diagnosis not present

## 2022-09-14 DIAGNOSIS — N186 End stage renal disease: Secondary | ICD-10-CM | POA: Diagnosis not present

## 2022-09-14 DIAGNOSIS — D509 Iron deficiency anemia, unspecified: Secondary | ICD-10-CM | POA: Diagnosis not present

## 2022-09-14 DIAGNOSIS — Z992 Dependence on renal dialysis: Secondary | ICD-10-CM | POA: Diagnosis not present

## 2022-09-14 DIAGNOSIS — D631 Anemia in chronic kidney disease: Secondary | ICD-10-CM | POA: Diagnosis not present

## 2022-09-14 DIAGNOSIS — R52 Pain, unspecified: Secondary | ICD-10-CM | POA: Diagnosis not present

## 2022-09-14 NOTE — Progress Notes (Signed)
Morning!  We drew this inpatient when he was still on IV AND po to make sure he wasn't having some weird metabolization issue and NOT getting enough in his system.  Will plan down titration next week at visit with Dr. Lovena Le who reviewed with me today.

## 2022-09-14 NOTE — Telephone Encounter (Signed)
Pharmacy Transitions of Care Follow-up Telephone Call  Date of discharge: 10/6  Discharge Diagnosis: Afib  How have you been since you were released from the hospital? He has been doing well since discharge. He stated that he does feel that one stitch was not removed. I told him to mention this at his visit with his PCP this week. We discussed s/sx of bleeding and the indication for his Eliquis   Medication changes made at discharge: START taking these medications  START taking these medications  Eliquis 5 MG Tabs tablet Generic drug: apixaban Take 1 tablet (5 mg total) by mouth 2 (two) times daily.  mexiletine 150 MG capsule Commonly known as: MEXITIL Take 2 capsules (300 mg total) by mouth every 12 (twelve) hours.  pantoprazole 40 MG tablet Commonly known as: PROTONIX Take 1 tablet (40 mg total) by mouth daily.  predniSONE 10 MG tablet Commonly known as: DELTASONE Take 3 tablets (30 mg total) by mouth daily with breakfast.  sulfamethoxazole-trimethoprim 400-80 MG tablet Commonly known as: BACTRIM Take 1 tablet by mouth 3 (three) times a week. Will take as long as you are on prednisone   CHANGE how you take these medications  CHANGE how you take these medications  amiodarone 200 MG tablet Commonly known as: PACERONE Take 2 tablets (400 mg total) by mouth 3 (three) times daily for 14 days, THEN 2 tablets (400 mg total) 2 (two) times daily. Start taking on: September 10, 2022 What changed: See the new instructions.  calcium acetate 667 MG capsule Commonly known as: PHOSLO Take 2 capsule by mouth three times a day with meals What changed:  how much to take when to take this  Calmol-4 76-10 % Supp Use as directed once to twice daily What changed:  how much to take how to take this when to take this reasons to take this  carvedilol 12.5 MG tablet Commonly known as: COREG Take 1 tablet (12.5 mg total) by mouth 2 (two) times daily with a meal. What changed:  medication  strength how much to take when to take this  cetirizine 10 MG tablet Commonly known as: ZYRTEC Take 1 tablet (10 mg total) by mouth 2 (two) times daily. What changed:  when to take this reasons to take this  ethyl chloride spray SPRAY A SMALL AMOUNT THREE TIMES A WEEK JUST PRIOR TO NEEDLE INSERTION What changed:  how much to take how to take this when to take this  Symbicort 80-4.5 MCG/ACT inhaler Generic drug: budesonide-formoterol INHALE TWO PUFFS INTO THE LUNGS TWICE DAILY (BULK) What changed: See the new instructions.   CONTINUE taking these medications  CONTINUE taking these medications  * albuterol 108 (90 Base) MCG/ACT inhaler Commonly known as: VENTOLIN HFA Inhale 2 puffs into the lungs every 4 (four) hours as needed for wheezing or shortness of breath.  * albuterol (2.5 MG/3ML) 0.083% nebulizer solution Commonly known as: PROVENTIL TAKE 3 MLS (2.5 MG TOTAL) BY NEBULIZATION EVERY 6 (SIX) HOURS AS NEEDED FOR WHEEZING OR SHORTNESS OF BREATH.  atorvastatin 80 MG tablet Commonly known as: LIPITOR Take 1 tablet (80 mg total) by mouth at bedtime.  Auryxia 1 GM 210 MG(Fe) tablet Generic drug: ferric citrate Take 1 tablet (210 mg total) by mouth 3 (three) times daily.  docusate sodium 100 MG capsule Commonly known as: Colace Take 1 capsule (100 mg total) by mouth 2 (two) times daily.  EPINEPHrine 0.3 mg/0.3 mL Soaj injection Commonly known as: EPI-PEN Inject 0.3 mg into the muscle  once as needed for up to 2 doses (if worsening tongue swelling, SOB, hypoxia, or other concerns for progressive anaphylaxis).  fluticasone 50 MCG/ACT nasal spray Commonly known as: FLONASE  polyethylene glycol 17 g packet Commonly known as: MiraLax Take 17 g by mouth daily. Titrate up as needed  Velphoro 500 MG chewable tablet Generic drug: sucroferric oxyhydroxide  Vitamin D-3 125 MCG (5000 UT) Tabs    Medication changes verified by the patient? yes    Medication Accessibility:  Home  Pharmacy: [facility]   Was the patient provided with refills on discharged medications? yes  Is the patient interested in using a [place]? Currently utilizes  Is the patient able to afford medications? yes  Medication Review: APIXABAN (ELIQUIS)  Apixaban '5mg'$  BID initiated on 10/6 for Afib.  - Discussed importance of taking medication around the same time every day.  - Advised patient of medications to avoid (NSAIDs, ASA maintenance doses>100 mg daily)  - Educated that Tylenol (acetaminophen) will be the preferred analgesic to prevent risk of bleeding. - Emphasized importance of monitoring for signs and symptoms of bleeding (abnormal bruising, prolonged bleeding, nose bleeds, bleeding from gums, discolored urine, black tarry stools)  - Advised patient to alert all providers of anticoagulation therapy prior to starting a new medication or having a procedure.  Follow-up Appointments: Date Visit Type Length Department    09/15/2022 11:15 AM CARE COORDINATION 30 min [facility] [[id number]]  Patient Instructions:            09/16/2022  2:30 PM HOSPITAL FU 20 min [facility] [[id number]]  Patient Instructions:   Please arrive 15 minutes prior to your appointment.         09/24/2022  9:30 AM EST POST HOSP 30 min [facility] [[id number]]  Patient Instructions:            09/24/2022  4:00 PM PHYS DEFIB CK 15 min [facility] [place] [[id number]]  Patient Instructions:    Maryan Puls, PharmD PGY-1 [facility] Pharmacy Resident

## 2022-09-15 ENCOUNTER — Ambulatory Visit: Payer: Self-pay

## 2022-09-15 ENCOUNTER — Telehealth: Payer: Self-pay | Admitting: Internal Medicine

## 2022-09-15 DIAGNOSIS — T82198D Other mechanical complication of other cardiac electronic device, subsequent encounter: Secondary | ICD-10-CM | POA: Diagnosis not present

## 2022-09-15 DIAGNOSIS — E785 Hyperlipidemia, unspecified: Secondary | ICD-10-CM | POA: Diagnosis not present

## 2022-09-15 DIAGNOSIS — I132 Hypertensive heart and chronic kidney disease with heart failure and with stage 5 chronic kidney disease, or end stage renal disease: Secondary | ICD-10-CM | POA: Diagnosis not present

## 2022-09-15 DIAGNOSIS — Z9981 Dependence on supplemental oxygen: Secondary | ICD-10-CM | POA: Diagnosis not present

## 2022-09-15 DIAGNOSIS — Z7951 Long term (current) use of inhaled steroids: Secondary | ICD-10-CM | POA: Diagnosis not present

## 2022-09-15 DIAGNOSIS — I951 Orthostatic hypotension: Secondary | ICD-10-CM | POA: Diagnosis not present

## 2022-09-15 DIAGNOSIS — I5023 Acute on chronic systolic (congestive) heart failure: Secondary | ICD-10-CM | POA: Diagnosis not present

## 2022-09-15 DIAGNOSIS — N186 End stage renal disease: Secondary | ICD-10-CM | POA: Diagnosis not present

## 2022-09-15 DIAGNOSIS — I255 Ischemic cardiomyopathy: Secondary | ICD-10-CM | POA: Diagnosis not present

## 2022-09-15 DIAGNOSIS — I2510 Atherosclerotic heart disease of native coronary artery without angina pectoris: Secondary | ICD-10-CM | POA: Diagnosis not present

## 2022-09-15 DIAGNOSIS — I051 Rheumatic mitral insufficiency: Secondary | ICD-10-CM | POA: Diagnosis not present

## 2022-09-15 DIAGNOSIS — G4733 Obstructive sleep apnea (adult) (pediatric): Secondary | ICD-10-CM | POA: Diagnosis not present

## 2022-09-15 DIAGNOSIS — Z7952 Long term (current) use of systemic steroids: Secondary | ICD-10-CM | POA: Diagnosis not present

## 2022-09-15 DIAGNOSIS — Z792 Long term (current) use of antibiotics: Secondary | ICD-10-CM | POA: Diagnosis not present

## 2022-09-15 DIAGNOSIS — I272 Pulmonary hypertension, unspecified: Secondary | ICD-10-CM | POA: Diagnosis not present

## 2022-09-15 DIAGNOSIS — I472 Ventricular tachycardia, unspecified: Secondary | ICD-10-CM | POA: Diagnosis not present

## 2022-09-15 DIAGNOSIS — Z7901 Long term (current) use of anticoagulants: Secondary | ICD-10-CM | POA: Diagnosis not present

## 2022-09-15 DIAGNOSIS — Z992 Dependence on renal dialysis: Secondary | ICD-10-CM | POA: Diagnosis not present

## 2022-09-15 DIAGNOSIS — I5084 End stage heart failure: Secondary | ICD-10-CM | POA: Diagnosis not present

## 2022-09-15 DIAGNOSIS — J9601 Acute respiratory failure with hypoxia: Secondary | ICD-10-CM | POA: Diagnosis not present

## 2022-09-15 DIAGNOSIS — I4892 Unspecified atrial flutter: Secondary | ICD-10-CM | POA: Diagnosis not present

## 2022-09-15 DIAGNOSIS — I480 Paroxysmal atrial fibrillation: Secondary | ICD-10-CM | POA: Diagnosis not present

## 2022-09-15 NOTE — Telephone Encounter (Signed)
Call returned to [facility] [phone] , message left with call back requested.  Need to inquire if she has discussed the role of palliative care with the patient.

## 2022-09-15 NOTE — Patient Outreach (Signed)
  Care Coordination   Follow Up Visit Note   09/15/2022 Name: LAVERN MASLOW MRN: [id number] DOB: Sep 01, 1961  JIAN HODGMAN is a 61 y.o. year old male who sees [name], MD for primary care. I spoke with  [name] by phone today.  What matters to the patients health and wellness today?  Patient would like a Palliative Care referral.     Goals Addressed               This Visit's Progress     Patient Stated     I want my breathing and chest soreness to get better (pt-stated)        Care Coordination Interventions: Evaluation of current treatment plan related to paroxsymal ventricular tachycardia and patient's adherence to plan as established by provider Determined patient was admitted to [facility]; admit date: 08/24/22-09/10/22 Review of patient status, including review of consultant's reports, relevant laboratory and other test results, and medications completed Noted recommendation for Palliative Care, no referral noted Educated patient regarding Palliative Care and determined patient would like referral  Educated patient with rationale on how to perform deep breathing exercises, patient was able to teach back  Determined patient filled Hydroxyzine for anxiety as prescribed by PCP Discussed patient is scheduled for in home PT this afternoon Discussed patient will continue his HD schedule T, Th, Sat, denies need for help with transportation  Reviewed upcoming scheduled post hospital PCP follow up scheduled for 09/16/22 '@2'$ :30 PM  Placed outbound call to PCP, Dr. Karle Plumber, spoke with [name] CM regarding request for Palliative Care referral             SDOH assessments and interventions completed:  Yes  SDOH Interventions Today    Flowsheet Row Most Recent Value  SDOH Interventions   Transportation Interventions Intervention Not Indicated        Care Coordination Interventions Activated:  Yes  Care Coordination  Interventions:  Yes, provided   Follow up plan: Follow up call scheduled for 09/29/22 '@12'$  PM    Encounter Outcome:  Pt. Visit Completed

## 2022-09-15 NOTE — Telephone Encounter (Signed)
Copied from [place] [id number]. Topic: General - Other >> Sep 15, 2022 11:42 AM Ludger Nutting wrote: Glenard Haring with [facility] is requesting Palliative care for patient. Please advise.

## 2022-09-15 NOTE — Progress Notes (Signed)
Thanks, info noted. Sent to pcp as FYI.---DD,RMA

## 2022-09-15 NOTE — Telephone Encounter (Signed)
I spoke to [facility].  She explained that she has spoken to the patient about palliative care and he is interested in the program. I informed her that he has an appointment with Dr [name] tomorrow and the option of palliative care can be discussed at that time prior to making the referral.

## 2022-09-15 NOTE — Patient Instructions (Signed)
Visit Information  Thank you for taking time to visit with me today. Please don't hesitate to contact me if I can be of assistance to you.   Following are the goals we discussed today:   Goals Addressed               This Visit's Progress     Patient Stated     I want my breathing and chest soreness to get better (pt-stated)        Care Coordination Interventions: Evaluation of current treatment plan related to paroxsymal ventricular tachycardia and patient's adherence to plan as established by provider Determined patient was admitted to [facility]; admit date: 08/24/22-09/10/22 Review of patient status, including review of consultant's reports, relevant laboratory and other test results, and medications completed Noted recommendation for Palliative Care, no referral noted Educated patient regarding Palliative Care and determined patient would like referral  Educated patient with rationale on how to perform deep breathing exercises, patient was able to teach back  Determined patient filled Hydroxyzine for anxiety as prescribed by PCP Discussed patient is scheduled for in home PT this afternoon Discussed patient will continue his HD schedule T, Th, Sat, denies need for help with transportation  Reviewed upcoming scheduled post hospital PCP follow up scheduled for 09/16/22 '@2'$ :30 PM  Placed outbound call to PCP, Dr. Karle Plumber, spoke with [name] CM regarding request for Palliative Care referral           Our next appointment is by telephone on 09/29/22 at 12 PM  Please call the care guide team at [phone] if you need to cancel or reschedule your appointment.   If you are experiencing a Mental Health or [place] or need someone to talk to, please call 1-800-273-TALK (toll free, 24 hour hotline)  Patient verbalizes understanding of instructions and care plan provided today and agrees to view in [place]. Active MyChart status and patient  understanding of how to access instructions and care plan via MyChart confirmed with patient.     Barb Merino, RN, BSN, CCM Care Management Coordinator [place] [facility]  Direct Phone: [phone]

## 2022-09-15 NOTE — Telephone Encounter (Signed)
Home Health Verbal Orders - Caller/Agency: [facility]  Callback Number: [phone] Requesting OT/PT/Skilled Nursing/Social Work/Speech Therapy:  Frequency:   Saw pt today for eval  BP sitting down 140/80 Stood up 120/80 Reports intermittent lightheadedness   Reports some pink sputum in the morning, started recently   Reports difficulty with sleep due to anxiety  Medicines: Not taking (on discharge)  Auryxia  Calcium Acetate  Calmol-4   Oxygen rest 99%  Moderate exertion 89% on room air (briefly dropped)  PT orders 1w4  Would benefit from a referral to a [place] Nurse for disease and medication management Also a Education officer, museum with a focus on housing

## 2022-09-16 ENCOUNTER — Ambulatory Visit: Payer: Medicare Other | Attending: Internal Medicine | Admitting: Internal Medicine

## 2022-09-16 ENCOUNTER — Telehealth: Payer: Self-pay | Admitting: Internal Medicine

## 2022-09-16 ENCOUNTER — Encounter: Payer: Self-pay | Admitting: Internal Medicine

## 2022-09-16 VITALS — BP 118/68 | HR 71 | Ht 67.0 in | Wt 249.2 lb

## 2022-09-16 DIAGNOSIS — I5022 Chronic systolic (congestive) heart failure: Secondary | ICD-10-CM

## 2022-09-16 DIAGNOSIS — F419 Anxiety disorder, unspecified: Secondary | ICD-10-CM

## 2022-09-16 DIAGNOSIS — R52 Pain, unspecified: Secondary | ICD-10-CM | POA: Diagnosis not present

## 2022-09-16 DIAGNOSIS — D509 Iron deficiency anemia, unspecified: Secondary | ICD-10-CM | POA: Diagnosis not present

## 2022-09-16 DIAGNOSIS — Z09 Encounter for follow-up examination after completed treatment for conditions other than malignant neoplasm: Secondary | ICD-10-CM | POA: Diagnosis not present

## 2022-09-16 DIAGNOSIS — I480 Paroxysmal atrial fibrillation: Secondary | ICD-10-CM | POA: Diagnosis not present

## 2022-09-16 DIAGNOSIS — N186 End stage renal disease: Secondary | ICD-10-CM | POA: Diagnosis not present

## 2022-09-16 DIAGNOSIS — D631 Anemia in chronic kidney disease: Secondary | ICD-10-CM | POA: Diagnosis not present

## 2022-09-16 DIAGNOSIS — Z992 Dependence on renal dialysis: Secondary | ICD-10-CM | POA: Diagnosis not present

## 2022-09-16 DIAGNOSIS — N2581 Secondary hyperparathyroidism of renal origin: Secondary | ICD-10-CM | POA: Diagnosis not present

## 2022-09-16 DIAGNOSIS — I472 Ventricular tachycardia, unspecified: Secondary | ICD-10-CM | POA: Diagnosis not present

## 2022-09-16 NOTE — Progress Notes (Signed)
Patient ID: Victor Thompson, male    DOB: 19-Apr-1961  MRN: [id number]  CC: Transition of care visit. Date of hospitalization: 9/19 - 09/10/2022 Date of call from case worker: 09/13/2022 Subjective: Victor Thompson is a 61 y.o. male who presents for transition of care visit. His concerns today include:  Pt with hx of ESRD on hemodialysis (Dr. Joelyn Oms), HTN, CAD (with hx of cardiac arrest), ICD, combined sys/dia CHF (EF 25-30 % 01/2021 and 10/2021), HL, OSA on CPAP, Obese, COPD, chronic lower back pain with multilevel spondylosis.   Patient hospitalized 9/19 - 09/10/2022 with refracted [place]netta storm.  Greater than 12 shocks from his ICD.  Magnet applied.  Went into VF with send scope requiring external cardioversion.  Intubated, sedated and loaded with amiodarone.  Tried with quinidine and procainamide both of which were ineffective.  Amiodarone and mexiletine increased.  Patient still had recurrent V. tach with ICD shocks.  He ultimately decided to become DNR/DNI and ICD was deactivated.  He was sent home with home health nurse and PT services.  Palliative care/hospice services could also be engaged in the outpatient.  Continued on Eliquis for stroke prevention due to A-fib during admission.  He was discharged on prednisone 30 mg daily with Bactrim for PJP prophylaxis (?).  He was advised to not drive for 6 months post ICD shocks.  Today: Patient denies any palpitations or dizziness.  His chest is sore and he attributes that from being shocked multiple times from his ICD device.  Denies any bruising or bleeding on Eliquis.  Did not bring his medications with him.  He tells me overall he takes 11 medicines.  He reports some swelling of the upper lip.  He thinks he is allergic to something.  He has had some anxiety during and post this recent hospitalization.  He is not interested in doing a palliative care consult.  I inquired whether he has an advanced directive or living will.  He tells me  that his mother and sister know his wishes.  I encouraged him to execute healthcare power of attorney or living will. Keep hydroxyzine at nights to help him sleep and to help decrease anxiety that he is experience from being shocked multiple times.  He is tolerating the medicine well.  He is not sure why he is on the prednisone.  The discharge summary said for PJP prophylaxis but I am not sure.  I will send a message to the discharging physician to clarify.  Patient Active Problem List   Diagnosis Date Noted   Paroxysmal atrial fibrillation (HCC)    Nausea and vomiting    Paroxysmal ventricular tachycardia ([place]) 08/24/2022   VT (ventricular tachycardia) ([place]) 08/16/2022   Chronic obstructive pulmonary disease, unspecified COPD type ([place]) 06/18/2022   Acute respiratory failure with hypoxia ([place]) 05/28/2022   AICD discharge 05/25/2022   Seasonal and perennial allergic rhinitis 11/12/2021   Moderate persistent asthma, uncomplicated 65/78/4696   STD exposure 08/27/2021   ESRD on dialysis ([place]) 02/06/2021   Hypervolemia associated with renal insufficiency 01/12/2021   Angioedema 09/22/2020   Acute on chronic HFrEF (heart failure with reduced ejection fraction) ([place]) 06/14/2020   Secondary hyperparathyroidism of renal origin ([place]) 07/15/2019   Vitamin D deficiency 07/15/2019   CAD (coronary artery disease), native coronary artery 04/10/2019   Chronic midline low back pain without sciatica 02/17/2018   Ventricular tachycardia ([place]) 03/29/2016   Defibrillator discharge    HFrEF (heart failure with reduced  ejection fraction) ([place]) 01/22/2016   Erectile dysfunction 01/22/2016   Allergic rhinitis 01/22/2016   GERD (gastroesophageal reflux disease) 01/22/2016   PROTEINURIA 02/17/2010   Dilated cardiomyopathy ([place]) 02/16/2010   Obstructive sleep apnea 02/16/2010   EXTERNAL HEMORRHOIDS 09/26/2009   Tobacco use disorder in remission 06/03/2009   Essential hypertension 10/01/2008   Hyperlipidemia  07/14/2007   Class 3 severe obesity due to excess calories with serious comorbidity and body mass index (BMI) of 40.0 to 44.9 in adult [facility]) 07/14/2007   Asthma 07/14/2007     Current Outpatient Medications on File Prior to Visit  Medication Sig Dispense Refill   albuterol (PROVENTIL) (2.5 MG/3ML) 0.083% nebulizer solution TAKE 3 MLS (2.5 MG TOTAL) BY NEBULIZATION EVERY 6 (SIX) HOURS AS NEEDED FOR WHEEZING OR SHORTNESS OF BREATH. 75 mL 12   albuterol (VENTOLIN HFA) 108 (90 Base) MCG/ACT inhaler Inhale 2 puffs into the lungs every 4 (four) hours as needed for wheezing or shortness of breath. 8 g 6   amiodarone (PACERONE) 200 MG tablet Take 2 tablets (400 mg total) by mouth 3 (three) times daily for 14 days, THEN 2 tablets (400 mg total) 2 (two) times daily. 204 tablet 0   apixaban (ELIQUIS) 5 MG TABS tablet Take 1 tablet (5 mg total) by mouth 2 (two) times daily. 60 tablet 6   atorvastatin (LIPITOR) 80 MG tablet Take 1 tablet (80 mg total) by mouth at bedtime. 90 tablet 3   AURYXIA 1 GM 210 MG(Fe) tablet Take 1 tablet (210 mg total) by mouth 3 (three) times daily. 270 tablet 1   calcium acetate (PHOSLO) 667 MG capsule Take 2 capsule by mouth three times a day with meals (Patient taking differently: Take 1,334 mg by mouth 3 (three) times daily with meals.) 180 capsule 6   carvedilol (COREG) 12.5 MG tablet Take 1 tablet (12.5 mg total) by mouth 2 (two) times daily with a meal. 60 tablet 6   cetirizine (ZYRTEC) 10 MG tablet Take 1 tablet (10 mg total) by mouth 2 (two) times daily. (Patient taking differently: Take 10 mg by mouth 2 (two) times daily as needed for allergies.) 60 tablet 5   Cholecalciferol (VITAMIN D-3) 125 MCG (5000 UT) TABS Take 5,000 Units by mouth daily.     docusate sodium (COLACE) 100 MG capsule Take 1 capsule (100 mg total) by mouth 2 (two) times daily. 60 capsule 4   EPINEPHrine 0.3 mg/0.3 mL IJ SOAJ injection Inject 0.3 mg into the muscle once as needed for up to 2 doses (if  worsening tongue swelling, SOB, hypoxia, or other concerns for progressive anaphylaxis). 2 each 2   ethyl chloride spray SPRAY A SMALL AMOUNT THREE TIMES A WEEK JUST PRIOR TO NEEDLE INSERTION (Patient taking differently: Apply 1 Application topically 3 (three) times a week.) 116 mL 11   fluticasone (FLONASE) 50 MCG/ACT nasal spray Place 1 spray into both nostrils as needed for allergies or rhinitis.     hydrOXYzine (ATARAX) 25 MG tablet Take 1-2 tablets (25-50 mg total) by mouth at bedtime as needed for anxiety/insomnia. 60 tablet 1   mexiletine (MEXITIL) 150 MG capsule Take 2 capsules (300 mg total) by mouth every 12 (twelve) hours. 120 capsule 6   pantoprazole (PROTONIX) 40 MG tablet Take 1 tablet (40 mg total) by mouth daily. 30 tablet 6   polyethylene glycol (MIRALAX) 17 g packet Take 17 g by mouth daily. Titrate up as needed 14 each 0   predniSONE (DELTASONE) 10 MG tablet Take 3  tablets (30 mg total) by mouth daily with breakfast. 90 tablet 2   Rectal Protectant-Emollient (CALMOL-4) 76-10 % SUPP Use as directed once to twice daily (Patient taking differently: Place 1 suppository rectally 2 (two) times daily as needed (constipation). Use as directed once to twice daily) 6 suppository 2   sulfamethoxazole-trimethoprim (BACTRIM) 400-80 MG tablet Take 1 tablet by mouth 3 (three) times a week. Will take as long as you are on prednisone 36 tablet 0   SYMBICORT 80-4.5 MCG/ACT inhaler INHALE TWO PUFFS INTO THE LUNGS TWICE DAILY (BULK) (Patient taking differently: Inhale 2 puffs into the lungs in the morning and at bedtime.) 10.2 g 6   VELPHORO 500 MG chewable tablet Chew 1,000 mg by mouth 3 (three) times daily.     No current facility-administered medications on file prior to visit.    Allergies  Allergen Reactions   Ace Inhibitors Swelling    Swelling of the tongue   Influenza Vaccines Hives and Swelling    SWELLING REACTION UNSPECIFIED    Ketorolac Swelling    SWELLING REACTION UNSPECIFIED     Lidocaine Swelling    TONGUE SWELLS   Lisinopril Swelling    TONGUE SWELLING Pt reported problem with a BP med which sounded like  lisinopril  But as of 09/19/06,pt had tolerated altace without problem   Penicillins Swelling    TONGUE SWELLS Has patient had a PCN reaction causing immediate rash, facial/tongue/throat swelling, SOB or lightheadedness with hypotension: Yes Has patient had a PCN reaction causing severe rash involving mucus membranes or skin necrosis: No Has patient had a PCN reaction that required hospitalization No Has patient had a PCN reaction occurring within the last 10 years: Yes If all of the above answers are "NO", then may proceed with Cephalosporin use.     Social History   Socioeconomic History   Marital status: Single    Spouse name: Not on file   Number of children: 3   Years of education: Not on file   Highest education level: Not on file  Occupational History   Occupation: retired  Tobacco Use   Smoking status: Never   Smokeless tobacco: Never  Vaping Use   Vaping Use: Never used  Substance and Sexual Activity   Alcohol use: No   Drug use: No   Sexual activity: Yes  Other Topics Concern   Not on file  Social History Narrative   ** Merged History Encounter **       Social Determinants of Health   Financial Resource Strain: Not on file  Food Insecurity: No Food Insecurity (08/28/2022)   Hunger Vital Sign    Worried About Running Out of Food in the Last Year: Never true    Ran Out of Food in the Last Year: Never true  Transportation Needs: No Transportation Needs (09/15/2022)   PRAPARE - Hydrologist (Medical): No    Lack of Transportation (Non-Medical): No  Physical Activity: Insufficiently Active (08/20/2022)   Exercise Vital Sign    Days of Exercise per Week: 1 day    Minutes of Exercise per Session: 10 min  Stress: Not on file  Social Connections: Not on file  Intimate Partner Violence: Not At Risk  (08/28/2022)   Humiliation, Afraid, Rape, and Kick questionnaire    Fear of Current or Ex-Partner: No    Emotionally Abused: No    Physically Abused: No    Sexually Abused: No    Family History  Problem Relation  Age of Onset   Hypertension Mother    Liver disease Father    Colon cancer Neg Hx    Esophageal cancer Neg Hx    Rectal cancer Neg Hx    Stomach cancer Neg Hx     Past Surgical History:  Procedure Laterality Date   AV FISTULA PLACEMENT Right 03/15/2019   Procedure: RIGHT ARM ARTERIOVENOUS (AV) FISTULA CREATION;  Surgeon: Angelia Mould, MD;  Location: [place];  Service: Vascular;  Laterality: Right;   COLONOSCOPY     COLONOSCOPY W/ BIOPSIES AND POLYPECTOMY     CORONARY STENT PLACEMENT  2007   IMPLANTABLE CARDIOVERTER DEFIBRILLATOR IMPLANT  2015   RIGHT/LEFT HEART CATH AND CORONARY ANGIOGRAPHY N/A 07/08/2021   Procedure: RIGHT/LEFT HEART CATH AND CORONARY ANGIOGRAPHY;  Surgeon: Larey Dresser, MD;  Location: [place] [facility];  Service: Cardiovascular;  Laterality: N/A;   RIGHT/LEFT HEART CATH AND CORONARY ANGIOGRAPHY N/A 08/25/2022   Procedure: RIGHT/LEFT HEART CATH AND CORONARY ANGIOGRAPHY;  Surgeon: Larey Dresser, MD;  Location: [place] [facility];  Service: Cardiovascular;  Laterality: N/A;   SUBQ ICD CHANGEOUT N/A 10/13/2020   Procedure: SUBQ ICD CHANGEOUT;  Surgeon: Evans Lance, MD;  Location: [place] [facility];  Service: Cardiovascular;  Laterality: N/A;    ROS: Review of Systems Negative except as stated above  PHYSICAL EXAM: BP 118/68   Pulse 71   Ht '5\' 7"'$  (1.702 m)   Wt 249 lb 3.2 oz (113 kg)   BMI 39.03 kg/m   Physical Exam  General appearance - alert, well appearing, obese older African-American male and in no distress Mental status - normal mood, behavior, speech, dress, motor activity, and thought processes Neck - supple, no significant adenopathy Chest - clear to auscultation, no wheezes, rales or rhonchi, symmetric air entry Heart -  normal rate, regular rhythm, normal S1, S2, no murmurs, rubs, clicks or gallops Extremities - peripheral pulses normal, no pedal edema, no clubbing or cyanosis      Latest Ref Rng & Units 09/10/2022    5:07 AM 09/09/2022    4:59 AM 09/08/2022   10:46 AM  CMP  Glucose 70 - 99 mg/dL 104  117    BUN 6 - 20 mg/dL 62  88    Creatinine 0.61 - 1.24 mg/dL 7.45  9.07    Sodium 135 - 145 mmol/L 136  133    Potassium 3.5 - 5.1 mmol/L 4.2  4.1    Chloride 98 - 111 mmol/L 98  93    CO2 22 - 32 mmol/L 23  22    Calcium 8.9 - 10.3 mg/dL 8.2  8.3    Total Protein 6.5 - 8.1 g/dL   5.6   Total Bilirubin 0.3 - 1.2 mg/dL   0.4   Alkaline Phos 38 - 126 U/L   55   AST 15 - 41 U/L   13   ALT 0 - 44 U/L   19    Lipid Panel     Component Value Date/Time   CHOL 230 (H) 08/16/2022 1004   TRIG 47 09/01/2022 0354   HDL 27 (L) 08/16/2022 1004   CHOLHDL 8.5 08/16/2022 1004   VLDL 39 08/16/2022 1004   LDLCALC 164 (H) 08/16/2022 1004    CBC    Component Value Date/Time   WBC 8.5 09/10/2022 0507   RBC 3.05 (L) 09/10/2022 0507   HGB 10.3 (L) 09/10/2022 0507   HGB 12.0 (L) 09/08/2021 1450   HCT 29.2 (  L) 09/10/2022 0507   HCT 35.4 (L) 09/08/2021 1450   PLT 237 09/10/2022 0507   PLT 179 10/10/2020 0925   MCV 95.7 09/10/2022 0507   MCV 95 09/08/2021 1450   MCH 33.8 09/10/2022 0507   MCHC 35.3 09/10/2022 0507   RDW 16.8 (H) 09/10/2022 0507   RDW 14.2 09/08/2021 1450   LYMPHSABS 0.4 (L) 09/10/2022 0507   LYMPHSABS 0.8 09/08/2021 1450   MONOABS 0.6 09/10/2022 0507   EOSABS 0.0 09/10/2022 0507   EOSABS 0.3 09/08/2021 1450   BASOSABS 0.0 09/10/2022 0507   BASOSABS 0.0 09/08/2021 1450    ASSESSMENT AND PLAN: 1. Hospital discharge follow-up   2. V-tach ([place]) 3. Chronic systolic congestive heart failure, NYHA class 2 ([place]) 4. PAF (paroxysmal atrial fibrillation) ([place]) Followed by cardiology. He is currently on amiodarone, Eliquis, carvedilol, mexiletine, His ICD is off.  He declines referral to  palliative care. Message sent to the discharging physician to clarify why patient is on 30 mg of prednisone daily with refills  5. Anxiety On hydroxyzine and tolerating the medicine.     Patient was given the opportunity to ask questions.  Patient verbalized understanding of the plan and was able to repeat key elements of the plan.   This documentation was completed using Radio producer.  Any transcriptional errors are unintentional.  No orders of the defined types were placed in this encounter.    Requested Prescriptions    No prescriptions requested or ordered in this encounter    No follow-ups on file.  Karle Plumber, MD, FACP

## 2022-09-16 NOTE — Telephone Encounter (Signed)
FYI

## 2022-09-16 NOTE — Telephone Encounter (Signed)
Caller making provider aware of a level 2 interaction between the following medications  albuterol (PROVENTIL) (2.5 MG/3ML) 0.083% nebulizer solution  albuterol (VENTOLIN HFA) 108 (90 Base) MCG/ACT inhaler    SYMBICORT 80-4.5 MCG/ACT inhaler   Interacts w/  carvedilol (COREG) 12.5 MG tablet   amiodarone (PACERONE) 200 MG tablet  Interacts w/ hydrOXYzine (ATARAX) 25 MG tablet

## 2022-09-18 DIAGNOSIS — D631 Anemia in chronic kidney disease: Secondary | ICD-10-CM | POA: Diagnosis not present

## 2022-09-18 DIAGNOSIS — D509 Iron deficiency anemia, unspecified: Secondary | ICD-10-CM | POA: Diagnosis not present

## 2022-09-18 DIAGNOSIS — N2581 Secondary hyperparathyroidism of renal origin: Secondary | ICD-10-CM | POA: Diagnosis not present

## 2022-09-18 DIAGNOSIS — N186 End stage renal disease: Secondary | ICD-10-CM | POA: Diagnosis not present

## 2022-09-18 DIAGNOSIS — R52 Pain, unspecified: Secondary | ICD-10-CM | POA: Diagnosis not present

## 2022-09-18 DIAGNOSIS — Z992 Dependence on renal dialysis: Secondary | ICD-10-CM | POA: Diagnosis not present

## 2022-09-18 NOTE — Telephone Encounter (Signed)
Noted  

## 2022-09-19 ENCOUNTER — Telehealth: Payer: Self-pay | Admitting: Internal Medicine

## 2022-09-19 NOTE — Telephone Encounter (Signed)
-----   Message from Shirley Friar, PA-C sent at 09/18/2022  2:27 PM EDT ----- Regarding: RE: Prednisone  Pt was started empirically on long term prednisone 9/25 to treat for presumed Sarcoidosis as we had few other options to treat his refractory Ventricular Tachycardia.  The Heart Failure and Clinical Pharmacy teams recommended that in the setting of chronic steroid use he additionally be started on Bactrim for Pneumocystis Jjirovecii Pneumonia prophylaxis.  Prednisone taper will be per the HF team, but suspect he will be on 30 mg daily very slow taper,  pending his next HF visit on 10/20.    He is also pending PET scan to help [place] clarify his sarcoid diagnosis.   From my understanding and discussion with the pharmacy team, He will remain on bactrim as long as he is using chronic steroids given his co-morbidities.     ----- Message ----- From: [name], MD Sent: 09/18/2022   1:05 PM EDT To: Evans Lance, MD; # Subject: Prednisone                                     I am the PCP for this pt. I saw him 2 days ago for hosp f/u.  Can one of you please clarify why the pt is on Prednisone 30 mg daily and how long is he to remain on it?  The dischg summary said for PJP prophylaxis.  What is PJP?

## 2022-09-20 ENCOUNTER — Encounter: Payer: Medicare Other | Admitting: Internal Medicine

## 2022-09-21 ENCOUNTER — Ambulatory Visit: Payer: Self-pay

## 2022-09-21 DIAGNOSIS — N2581 Secondary hyperparathyroidism of renal origin: Secondary | ICD-10-CM | POA: Diagnosis not present

## 2022-09-21 DIAGNOSIS — E785 Hyperlipidemia, unspecified: Secondary | ICD-10-CM | POA: Diagnosis not present

## 2022-09-21 DIAGNOSIS — Z7952 Long term (current) use of systemic steroids: Secondary | ICD-10-CM | POA: Diagnosis not present

## 2022-09-21 DIAGNOSIS — I951 Orthostatic hypotension: Secondary | ICD-10-CM | POA: Diagnosis not present

## 2022-09-21 DIAGNOSIS — I272 Pulmonary hypertension, unspecified: Secondary | ICD-10-CM | POA: Diagnosis not present

## 2022-09-21 DIAGNOSIS — I480 Paroxysmal atrial fibrillation: Secondary | ICD-10-CM | POA: Diagnosis not present

## 2022-09-21 DIAGNOSIS — D631 Anemia in chronic kidney disease: Secondary | ICD-10-CM | POA: Diagnosis not present

## 2022-09-21 DIAGNOSIS — Z792 Long term (current) use of antibiotics: Secondary | ICD-10-CM | POA: Diagnosis not present

## 2022-09-21 DIAGNOSIS — I5023 Acute on chronic systolic (congestive) heart failure: Secondary | ICD-10-CM | POA: Diagnosis not present

## 2022-09-21 DIAGNOSIS — N186 End stage renal disease: Secondary | ICD-10-CM | POA: Diagnosis not present

## 2022-09-21 DIAGNOSIS — G4733 Obstructive sleep apnea (adult) (pediatric): Secondary | ICD-10-CM | POA: Diagnosis not present

## 2022-09-21 DIAGNOSIS — J9601 Acute respiratory failure with hypoxia: Secondary | ICD-10-CM | POA: Diagnosis not present

## 2022-09-21 DIAGNOSIS — I255 Ischemic cardiomyopathy: Secondary | ICD-10-CM | POA: Diagnosis not present

## 2022-09-21 DIAGNOSIS — I2510 Atherosclerotic heart disease of native coronary artery without angina pectoris: Secondary | ICD-10-CM | POA: Diagnosis not present

## 2022-09-21 DIAGNOSIS — D509 Iron deficiency anemia, unspecified: Secondary | ICD-10-CM | POA: Diagnosis not present

## 2022-09-21 DIAGNOSIS — I4892 Unspecified atrial flutter: Secondary | ICD-10-CM | POA: Diagnosis not present

## 2022-09-21 DIAGNOSIS — Z992 Dependence on renal dialysis: Secondary | ICD-10-CM | POA: Diagnosis not present

## 2022-09-21 DIAGNOSIS — Z7901 Long term (current) use of anticoagulants: Secondary | ICD-10-CM | POA: Diagnosis not present

## 2022-09-21 DIAGNOSIS — I472 Ventricular tachycardia, unspecified: Secondary | ICD-10-CM | POA: Diagnosis not present

## 2022-09-21 DIAGNOSIS — Z7951 Long term (current) use of inhaled steroids: Secondary | ICD-10-CM | POA: Diagnosis not present

## 2022-09-21 DIAGNOSIS — T82198D Other mechanical complication of other cardiac electronic device, subsequent encounter: Secondary | ICD-10-CM | POA: Diagnosis not present

## 2022-09-21 DIAGNOSIS — Z9981 Dependence on supplemental oxygen: Secondary | ICD-10-CM | POA: Diagnosis not present

## 2022-09-21 DIAGNOSIS — I051 Rheumatic mitral insufficiency: Secondary | ICD-10-CM | POA: Diagnosis not present

## 2022-09-21 DIAGNOSIS — I5084 End stage heart failure: Secondary | ICD-10-CM | POA: Diagnosis not present

## 2022-09-21 DIAGNOSIS — R52 Pain, unspecified: Secondary | ICD-10-CM | POA: Diagnosis not present

## 2022-09-21 DIAGNOSIS — I132 Hypertensive heart and chronic kidney disease with heart failure and with stage 5 chronic kidney disease, or end stage renal disease: Secondary | ICD-10-CM | POA: Diagnosis not present

## 2022-09-21 NOTE — Telephone Encounter (Signed)
I spoke to [facility] and informed her that the patient declined a palliative care referral

## 2022-09-21 NOTE — Patient Outreach (Signed)
  Care Coordination   Follow Up Visit Note   09/21/2022 Name: Victor Thompson MRN: [id number] DOB: 1961-09-02  Victor Thompson is a 61 y.o. year old male who sees Victor Pier, MD for primary care. I  spoke with Victor Thompson CM with [facility] today.   What matters to the patients health and wellness today?  Patient declines wanting a Palliative Care referral sent at this time.     Goals Addressed               This Visit's Progress     Patient Stated     I want my breathing and chest soreness to get better (pt-stated)        Care Coordination Interventions: Evaluation of current treatment plan related to paroxsymal ventricular tachycardia and patient's adherence to plan as established by provider Received inbound call from Victor Thompson with [facility] Wellness advising patient completed a visit with his PCP, Victor Thompson and declines having a Palliative Care referral sent at this time        SDOH assessments and interventions completed:  No     Care Coordination Interventions Activated:  Yes  Care Coordination Interventions:  Yes, provided   Follow up plan: Follow up call scheduled for 09/29/22 '@12PM'$     Encounter Outcome:  Pt. Visit Completed

## 2022-09-22 ENCOUNTER — Ambulatory Visit: Payer: Self-pay | Admitting: *Deleted

## 2022-09-22 ENCOUNTER — Telehealth: Payer: Self-pay | Admitting: Internal Medicine

## 2022-09-22 DIAGNOSIS — I5023 Acute on chronic systolic (congestive) heart failure: Secondary | ICD-10-CM | POA: Diagnosis not present

## 2022-09-22 DIAGNOSIS — G4733 Obstructive sleep apnea (adult) (pediatric): Secondary | ICD-10-CM | POA: Diagnosis not present

## 2022-09-22 DIAGNOSIS — I5084 End stage heart failure: Secondary | ICD-10-CM | POA: Diagnosis not present

## 2022-09-22 DIAGNOSIS — Z7952 Long term (current) use of systemic steroids: Secondary | ICD-10-CM | POA: Diagnosis not present

## 2022-09-22 DIAGNOSIS — E785 Hyperlipidemia, unspecified: Secondary | ICD-10-CM | POA: Diagnosis not present

## 2022-09-22 DIAGNOSIS — J449 Chronic obstructive pulmonary disease, unspecified: Secondary | ICD-10-CM | POA: Diagnosis not present

## 2022-09-22 DIAGNOSIS — Z7951 Long term (current) use of inhaled steroids: Secondary | ICD-10-CM | POA: Diagnosis not present

## 2022-09-22 DIAGNOSIS — I132 Hypertensive heart and chronic kidney disease with heart failure and with stage 5 chronic kidney disease, or end stage renal disease: Secondary | ICD-10-CM | POA: Diagnosis not present

## 2022-09-22 DIAGNOSIS — I051 Rheumatic mitral insufficiency: Secondary | ICD-10-CM | POA: Diagnosis not present

## 2022-09-22 DIAGNOSIS — I472 Ventricular tachycardia, unspecified: Secondary | ICD-10-CM | POA: Diagnosis not present

## 2022-09-22 DIAGNOSIS — J9601 Acute respiratory failure with hypoxia: Secondary | ICD-10-CM | POA: Diagnosis not present

## 2022-09-22 DIAGNOSIS — I951 Orthostatic hypotension: Secondary | ICD-10-CM | POA: Diagnosis not present

## 2022-09-22 DIAGNOSIS — T82198D Other mechanical complication of other cardiac electronic device, subsequent encounter: Secondary | ICD-10-CM | POA: Diagnosis not present

## 2022-09-22 DIAGNOSIS — N186 End stage renal disease: Secondary | ICD-10-CM | POA: Diagnosis not present

## 2022-09-22 DIAGNOSIS — I4892 Unspecified atrial flutter: Secondary | ICD-10-CM | POA: Diagnosis not present

## 2022-09-22 DIAGNOSIS — I480 Paroxysmal atrial fibrillation: Secondary | ICD-10-CM | POA: Diagnosis not present

## 2022-09-22 DIAGNOSIS — Z9981 Dependence on supplemental oxygen: Secondary | ICD-10-CM | POA: Diagnosis not present

## 2022-09-22 DIAGNOSIS — Z7901 Long term (current) use of anticoagulants: Secondary | ICD-10-CM | POA: Diagnosis not present

## 2022-09-22 DIAGNOSIS — Z792 Long term (current) use of antibiotics: Secondary | ICD-10-CM | POA: Diagnosis not present

## 2022-09-22 DIAGNOSIS — I255 Ischemic cardiomyopathy: Secondary | ICD-10-CM | POA: Diagnosis not present

## 2022-09-22 DIAGNOSIS — Z992 Dependence on renal dialysis: Secondary | ICD-10-CM | POA: Diagnosis not present

## 2022-09-22 DIAGNOSIS — I2510 Atherosclerotic heart disease of native coronary artery without angina pectoris: Secondary | ICD-10-CM | POA: Diagnosis not present

## 2022-09-22 DIAGNOSIS — I272 Pulmonary hypertension, unspecified: Secondary | ICD-10-CM | POA: Diagnosis not present

## 2022-09-22 NOTE — Telephone Encounter (Signed)
Reason for Disposition . Pain also in shoulder(s) or arm(s) or jaw  (Exception: Pain is clearly made worse by movement.)  Answer Assessment - Initial Assessment Questions 1. LOCATION: "Where does it hurt?"       Right sided 2. RADIATION: "Does the pain go anywhere else?" (e.g., into neck, jaw, arms, back)     No 3. ONSET: "When did the chest pain begin?" (Minutes, hours or days)      Early AM 4. PATTERN: "Does the pain come and go, or has it been constant since it started?"  "Does it get worse with exertion?"      Constant 5. DURATION: "How long does it last" (e.g., seconds, minutes, hours)     NA 6. SEVERITY: "How bad is the pain?"  (e.g., Scale 1-10; mild, moderate, or severe)    - MILD (1-3): doesn't interfere with normal activities     - MODERATE (4-7): interferes with normal activities or awakens from sleep    - SEVERE (8-10): excruciating pain, unable to do any normal activities       6/10 7. CARDIAC RISK FACTORS: "Do you have any history of heart problems or risk factors for heart disease?" (e.g., angina, prior heart attack; diabetes, high blood pressure, high cholesterol, smoker, or strong family history of heart disease)      8. PULMONARY RISK FACTORS: "Do you have any history of lung disease?"  (e.g., blood clots in lung, asthma, emphysema, birth control pills)      9. CAUSE: "What do you think is causing the chest pain?"     Unsure 10. OTHER SYMPTOMS: "Do you have any other symptoms?" (e.g., dizziness, nausea, vomiting, sweating, fever, difficulty breathing, cough)       "Gassy"  "TUMS helped with that."  Protocols used: Chest Pain-A-AH

## 2022-09-22 NOTE — Telephone Encounter (Signed)
Home Health Verbal Orders - Caller/Agency: April/ [facility]  Callback Number: [phone]  Requesting OT/PT/Skilled Nursing/Social Work/Speech Therapy: skilled nursing  Frequency: 1w5

## 2022-09-22 NOTE — Telephone Encounter (Signed)
  Chief Complaint: Chest pain Symptoms: Right sided chest pain constant 6/10, radiates to shoulder.  Frequency: 1 am this morning Pertinent Negatives: Patient denies SOB, nausea, sweating Disposition: '[x]'$ ED /'[]'$ Urgent Care (no appt availability in office) / '[]'$ Appointment(In office/virtual)/ '[]'$  [place] Virtual [facility]/ '[]'$ Home Care/ '[]'$ Refused Recommended Disposition /'[]'$ [place] Mobile [facility]/ '[]'$  Follow-up with PCP Additional Notes: Home Health nurse April calling ,pt present. Advised ED. States will follow disposition.

## 2022-09-22 NOTE — Telephone Encounter (Signed)
Home Health Verbal Orders - Caller/Agency: [facility]/ [facility]   Callback Number: [phone]   Requesting OT/PT/Skilled Nursing/Social Work/Speech Therapy: skilled nursing   Frequency: 1w5   Spoke to April, Nurse with [name]. Verbal Orders were given.

## 2022-09-23 DIAGNOSIS — N186 End stage renal disease: Secondary | ICD-10-CM | POA: Diagnosis not present

## 2022-09-23 DIAGNOSIS — N2581 Secondary hyperparathyroidism of renal origin: Secondary | ICD-10-CM | POA: Diagnosis not present

## 2022-09-23 DIAGNOSIS — D631 Anemia in chronic kidney disease: Secondary | ICD-10-CM | POA: Diagnosis not present

## 2022-09-23 DIAGNOSIS — Z992 Dependence on renal dialysis: Secondary | ICD-10-CM | POA: Diagnosis not present

## 2022-09-23 DIAGNOSIS — D509 Iron deficiency anemia, unspecified: Secondary | ICD-10-CM | POA: Diagnosis not present

## 2022-09-23 DIAGNOSIS — R52 Pain, unspecified: Secondary | ICD-10-CM | POA: Diagnosis not present

## 2022-09-24 ENCOUNTER — Ambulatory Visit (HOSPITAL_COMMUNITY)
Admit: 2022-09-24 | Discharge: 2022-09-24 | Disposition: A | Discharge status: Home / Self Care | Payer: Medicare Other | Source: Ambulatory Visit | Attending: Family Medicine | Admitting: Family Medicine

## 2022-09-24 ENCOUNTER — Other Ambulatory Visit (HOSPITAL_COMMUNITY): Payer: Self-pay

## 2022-09-24 ENCOUNTER — Encounter (HOSPITAL_COMMUNITY): Payer: Self-pay

## 2022-09-24 ENCOUNTER — Ambulatory Visit (INDEPENDENT_AMBULATORY_CARE_PROVIDER_SITE_OTHER): Payer: Medicare Other | Admitting: Internal Medicine

## 2022-09-24 ENCOUNTER — Encounter: Payer: Self-pay | Admitting: Internal Medicine

## 2022-09-24 ENCOUNTER — Other Ambulatory Visit: Payer: Self-pay

## 2022-09-24 ENCOUNTER — Telehealth: Payer: Self-pay | Admitting: Internal Medicine

## 2022-09-24 ENCOUNTER — Telehealth: Payer: Self-pay | Admitting: *Deleted

## 2022-09-24 ENCOUNTER — Telehealth (HOSPITAL_COMMUNITY): Payer: Self-pay

## 2022-09-24 VITALS — BP 124/70 | HR 74 | Wt 252.0 lb

## 2022-09-24 VITALS — BP 138/78 | HR 74 | Ht 67.0 in | Wt 253.0 lb

## 2022-09-24 DIAGNOSIS — I2510 Atherosclerotic heart disease of native coronary artery without angina pectoris: Secondary | ICD-10-CM

## 2022-09-24 DIAGNOSIS — Z7189 Other specified counseling: Secondary | ICD-10-CM | POA: Diagnosis not present

## 2022-09-24 DIAGNOSIS — I5022 Chronic systolic (congestive) heart failure: Secondary | ICD-10-CM | POA: Insufficient documentation

## 2022-09-24 DIAGNOSIS — I5084 End stage heart failure: Secondary | ICD-10-CM | POA: Diagnosis not present

## 2022-09-24 DIAGNOSIS — Z7952 Long term (current) use of systemic steroids: Secondary | ICD-10-CM | POA: Insufficient documentation

## 2022-09-24 DIAGNOSIS — Z992 Dependence on renal dialysis: Secondary | ICD-10-CM | POA: Insufficient documentation

## 2022-09-24 DIAGNOSIS — R251 Tremor, unspecified: Secondary | ICD-10-CM | POA: Diagnosis not present

## 2022-09-24 DIAGNOSIS — I951 Orthostatic hypotension: Secondary | ICD-10-CM | POA: Diagnosis not present

## 2022-09-24 DIAGNOSIS — I472 Ventricular tachycardia, unspecified: Secondary | ICD-10-CM | POA: Diagnosis not present

## 2022-09-24 DIAGNOSIS — Z95810 Presence of automatic (implantable) cardiac defibrillator: Secondary | ICD-10-CM | POA: Insufficient documentation

## 2022-09-24 DIAGNOSIS — R5383 Other fatigue: Secondary | ICD-10-CM | POA: Insufficient documentation

## 2022-09-24 DIAGNOSIS — I428 Other cardiomyopathies: Secondary | ICD-10-CM | POA: Diagnosis not present

## 2022-09-24 DIAGNOSIS — Z66 Do not resuscitate: Secondary | ICD-10-CM | POA: Diagnosis not present

## 2022-09-24 DIAGNOSIS — Z7951 Long term (current) use of inhaled steroids: Secondary | ICD-10-CM | POA: Diagnosis not present

## 2022-09-24 DIAGNOSIS — Z7901 Long term (current) use of anticoagulants: Secondary | ICD-10-CM | POA: Diagnosis not present

## 2022-09-24 DIAGNOSIS — Z79899 Other long term (current) drug therapy: Secondary | ICD-10-CM | POA: Insufficient documentation

## 2022-09-24 DIAGNOSIS — G4733 Obstructive sleep apnea (adult) (pediatric): Secondary | ICD-10-CM | POA: Diagnosis not present

## 2022-09-24 DIAGNOSIS — I132 Hypertensive heart and chronic kidney disease with heart failure and with stage 5 chronic kidney disease, or end stage renal disease: Secondary | ICD-10-CM | POA: Diagnosis not present

## 2022-09-24 DIAGNOSIS — I480 Paroxysmal atrial fibrillation: Secondary | ICD-10-CM

## 2022-09-24 DIAGNOSIS — T82198D Other mechanical complication of other cardiac electronic device, subsequent encounter: Secondary | ICD-10-CM | POA: Diagnosis not present

## 2022-09-24 DIAGNOSIS — Z8674 Personal history of sudden cardiac arrest: Secondary | ICD-10-CM | POA: Insufficient documentation

## 2022-09-24 DIAGNOSIS — Z8616 Personal history of COVID-19: Secondary | ICD-10-CM | POA: Insufficient documentation

## 2022-09-24 DIAGNOSIS — I255 Ischemic cardiomyopathy: Secondary | ICD-10-CM | POA: Insufficient documentation

## 2022-09-24 DIAGNOSIS — E785 Hyperlipidemia, unspecified: Secondary | ICD-10-CM | POA: Diagnosis not present

## 2022-09-24 DIAGNOSIS — N186 End stage renal disease: Secondary | ICD-10-CM | POA: Diagnosis not present

## 2022-09-24 DIAGNOSIS — I051 Rheumatic mitral insufficiency: Secondary | ICD-10-CM | POA: Diagnosis not present

## 2022-09-24 DIAGNOSIS — J9601 Acute respiratory failure with hypoxia: Secondary | ICD-10-CM | POA: Diagnosis not present

## 2022-09-24 DIAGNOSIS — I272 Pulmonary hypertension, unspecified: Secondary | ICD-10-CM | POA: Diagnosis not present

## 2022-09-24 DIAGNOSIS — Z792 Long term (current) use of antibiotics: Secondary | ICD-10-CM | POA: Diagnosis not present

## 2022-09-24 DIAGNOSIS — I4892 Unspecified atrial flutter: Secondary | ICD-10-CM | POA: Diagnosis not present

## 2022-09-24 DIAGNOSIS — I5023 Acute on chronic systolic (congestive) heart failure: Secondary | ICD-10-CM | POA: Diagnosis not present

## 2022-09-24 DIAGNOSIS — Z9981 Dependence on supplemental oxygen: Secondary | ICD-10-CM | POA: Diagnosis not present

## 2022-09-24 MED ORDER — CETIRIZINE HCL 10 MG PO TABS
10.0000 mg | ORAL_TABLET | Freq: Two times a day (BID) | ORAL | 6 refills | Status: DC
  Filled 2022-09-24 (×2): qty 60, 30d supply, fill #0

## 2022-09-24 MED ORDER — CARVEDILOL 12.5 MG PO TABS
18.7500 mg | ORAL_TABLET | Freq: Two times a day (BID) | ORAL | 6 refills | Status: DC
  Filled 2022-09-24 – 2022-09-27 (×3): qty 90, 30d supply, fill #0

## 2022-09-24 MED ORDER — AMIODARONE HCL 200 MG PO TABS
400.0000 mg | ORAL_TABLET | Freq: Two times a day (BID) | ORAL | 6 refills | Status: DC
  Filled 2022-09-24 – 2022-09-27 (×2): qty 120, 30d supply, fill #0

## 2022-09-24 MED ORDER — PREDNISONE 10 MG PO TABS
20.0000 mg | ORAL_TABLET | Freq: Every day | ORAL | 3 refills | Status: DC
  Filled 2022-09-24 – 2022-10-08 (×2): qty 60, 30d supply, fill #0

## 2022-09-24 NOTE — Patient Instructions (Addendum)
Thank you for coming in today  No labs today  DECREASE Amiodarone to 400 mg 1 tablet twice daily  INCREASE Coreg to 18.75 mg 1 1/2 tablet twice daily  Cetirizine 10 mg 1 tablet twice daily sent over to the pharmacy  Your physician recommends that you schedule a follow-up appointment in:  6-8 weeks with Dr. Aundra Dubin    Your provider has recommended you have a Cardiac PET Scan at [facility]. We will get this approved with your insurance company and get it scheduled for you. We will call you with the date and time and instructions. [facility] will call you to review this information the day before the test.     Do the following things EVERYDAY: Weigh yourself in the morning before breakfast. Write it down and keep it in a log. Take your medicines as prescribed Eat low salt foods--Limit salt (sodium) to 2000 mg per day.  Stay as active as you can everyday Limit all fluids for the day to less than 2 liters  If you have any questions or concerns before your next appointment please send [place] a message through [place] or call our office at [phone].    TO LEAVE A MESSAGE FOR THE NURSE SELECT OPTION 2, PLEASE LEAVE A MESSAGE INCLUDING: YOUR NAME DATE OF BIRTH CALL BACK NUMBER REASON FOR CALL**this is important as we prioritize the call backs  YOU WILL RECEIVE A CALL BACK THE SAME DAY AS LONG AS YOU CALL BEFORE 4:00 PM At the [facility], you and your health needs are our priority. As part of our continuing mission to provide you with exceptional heart care, we have created designated Provider Care Teams. These Care Teams include your primary Cardiologist (physician) and Advanced Practice Providers (APPs- Physician Assistants and Nurse Practitioners) who all work together to provide you with the care you need, when you need it.   You may see any of the following providers on your designated Care Team at your next follow up: Dr Glori Bickers Dr Loralie Champagne Dr. Roxana Hires, NP Lyda Jester, [place] [facility], [place] Forestine Na, NP Audry Riles, PharmD   Please be sure to bring in all your medications bottles to every appointment.

## 2022-09-24 NOTE — Progress Notes (Addendum)
PCP: [name], MD Cardiology: Dr. Radford Pax EP: Dr. Rayann Heman HF cardiology: Dr. Aundra Dubin  61 y.o. with history of CAD, ischemic CMP, and ESRD was referred by Dr. Radford Pax for evaluation of CHF.  He had PCI in 3662, uncertain what vessel was involved.  He has not had coronary angiography since that time due to advanced CKD.  He has had ischemic cardiomyopathy x years.  Echo in 2/22 showed EF 25-30% with moderate LV dilation and normal RV.  He had VT arrest in 2015, has St Jude subcutaneous ICD.  [name] in 2/22.  LHC/RHC was done in 8/22, showing no significant CAD and stable hemodynamics.   Echo 11/22 EF 25-30%, moderate LV dilation, diffuse hypokinesis, normal RV.   Patient had ICD shock in 6/23 while at HD => VT storm.  He was noted to be volume overloaded.  He was started on amiodarone.    Admitted 9/23 with VT storm. Required intubation with multiple amiodarone boluses. Echo showed EF 20-25%, GIIIDD (restrictive), RV normal. R/LHC showed nonobstructive CAD and elevated right and left filling pressures. Underwent CRRT for volume removal. EP consulted and started on procainamide. Started on prednisone for suspected sarcoid. Mexiletine later added for VT suppression. Able to wean pressors off and transition to iHD for volume removal. Decided on DNR, and ICD deactivated.  Hospitalization c/b PNA and paroxysmal atrial fibrillation. Plan to have cardiac PET at [facility] for cardiac sarcoidosis work up.  Today he returns for post hospital HF follow up. Overall feeling fine, but remains generally fatigued. He does not have dyspnea walking on flat ground or with ADLs. Understandably anxious about having another life-threatening arrhythmia. He has noticed minor hand tremors. Denies abnormal bleeding, CP, dizziness, edema, or PND/Orthopnea. Appetite ok. No fever or chills. Weight at home stable. Taking all medications. iHD T/Th/Sat and tolerating well, no low BPs. Asking how long he needs to take  prednisone.  ECG (personally reviewed): NSR, LBBB, QRS 170 msec  Labs (6/22): LDL 75 Labs (8/22): hgb 13.9 Labs (10/23): K 4.2, mag 2, hgb 10.3  PMH: 1. CAD: PCI in 9476, uncertain what vessel.  - Cardiolite 2017: Inferior and inferolateral large fixed defect suggestive of prior infarction, EF 28%.  - LHC (8/22): No significant CAD.  2. Chronic systolic CHF: Ischemic cardiomyopathy. St Jude subcutaneous ICD.  - Echo (7/20) with EF 25-30%.  - Echo (10/20) with EF 40-45% - Echo (2/22): EF 25-30%, moderate LV dilation, mild LVH, normal RV size and systolic function, mild MR.  - LHC/RHC (8/22): mean RA 2, PA 24/3, mean PCWP 9, CI 3.13; no significant coronary disease.  - Echo (11/22): EF 25-30%, moderate LV dilation, diffuse hypokinesis, normal RV. - Echo (9/23): EF 20-25%, severe LV dysfunction with global HK, grade III DD, normal RV, mild to moderate MR - R/LHC (9/23): non-obs CAD, elevated R/L pressures with pulmonary venous hypertension. RA mean 14, PA 53/27  (mean 37), PCWP mean 26, CO/CI (Fick) 8.98/3.96, PVR 2.8 WU 3. ESRD since 2/22 4. OSA on CPAP 5. H/o VT arrest: Subcutaneous St Jude ICD placed in 2015 at [facility].  - VT storm 6/23, amiodarone started.  - VT storm 9/23, mexiletine started 6. COVID-19 1/22.  7. HTN 8. Hyperlipidemia 9. Recurrent angioedema.  10. ? Sarcoidosis: Hi-Res chest CT (10/23) with no pulmonary findings suggestive of pulmonary sarcoidosis.  - Start on empiric prednisone. - Cardiac PET at [facility] ordered  Social History   Socioeconomic History   Marital status: Single  Spouse name: Not on file   Number of children: 3   Years of education: Not on file   Highest education level: Not on file  Occupational History   Occupation: retired  Tobacco Use   Smoking status: Never   Smokeless tobacco: Never  Vaping Use   Vaping Use: Never used  Substance and Sexual Activity   Alcohol use: No   Drug use: No   Sexual activity: Yes  Other Topics Concern    Not on file  Social History Narrative   ** Merged History Encounter **       Social Determinants of Health   Financial Resource Strain: Not on file  Food Insecurity: No Food Insecurity (08/28/2022)   Hunger Vital Sign    Worried About Running Out of Food in the Last Year: Never true    Ran Out of Food in the Last Year: Never true  Transportation Needs: No Transportation Needs (09/15/2022)   PRAPARE - Hydrologist (Medical): No    Lack of Transportation (Non-Medical): No  Physical Activity: Insufficiently Active (08/20/2022)   Exercise Vital Sign    Days of Exercise per Week: 1 day    Minutes of Exercise per Session: 10 min  Stress: Not on file  Social Connections: Not on file  Intimate Partner Violence: Not At Risk (08/28/2022)   Humiliation, Afraid, Rape, and Kick questionnaire    Fear of Current or Ex-Partner: No    Emotionally Abused: No    Physically Abused: No    Sexually Abused: No   Family History  Problem Relation Age of Onset   Hypertension Mother    Liver disease Father    Colon cancer Neg Hx    Esophageal cancer Neg Hx    Rectal cancer Neg Hx    Stomach cancer Neg Hx    ROS: All systems reviewed and negative except as per HPI.   Current Outpatient Medications  Medication Sig Dispense Refill   albuterol (PROVENTIL) (2.5 MG/3ML) 0.083% nebulizer solution TAKE 3 MLS (2.5 MG TOTAL) BY NEBULIZATION EVERY 6 (SIX) HOURS AS NEEDED FOR WHEEZING OR SHORTNESS OF BREATH. 75 mL 12   albuterol (VENTOLIN HFA) 108 (90 Base) MCG/ACT inhaler Inhale 2 puffs into the lungs every 4 (four) hours as needed for wheezing or shortness of breath. 8 g 6   amiodarone (PACERONE) 200 MG tablet Take 2 tablets (400 mg total) by mouth 3 (three) times daily for 14 days, THEN 2 tablets (400 mg total) 2 (two) times daily. 204 tablet 0   apixaban (ELIQUIS) 5 MG TABS tablet Take 1 tablet (5 mg total) by mouth 2 (two) times daily. 60 tablet 6   atorvastatin (LIPITOR) 80 MG  tablet Take 1 tablet (80 mg total) by mouth at bedtime. 90 tablet 3   AURYXIA 1 GM 210 MG(Fe) tablet Take 1 tablet (210 mg total) by mouth 3 (three) times daily. 270 tablet 1   calcium acetate (PHOSLO) 667 MG capsule Take 2 capsule by mouth three times a day with meals (Patient taking differently: Take 1,334 mg by mouth 3 (three) times daily with meals.) 180 capsule 6   carvedilol (COREG) 12.5 MG tablet Take 1 tablet (12.5 mg total) by mouth 2 (two) times daily with a meal. 60 tablet 6   cetirizine (ZYRTEC) 10 MG tablet Take 1 tablet (10 mg total) by mouth 2 (two) times daily. (Patient taking differently: Take 10 mg by mouth 2 (two) times daily as needed for allergies.)  60 tablet 5   Cholecalciferol (VITAMIN D-3) 125 MCG (5000 UT) TABS Take 5,000 Units by mouth daily.     docusate sodium (COLACE) 100 MG capsule Take 1 capsule (100 mg total) by mouth 2 (two) times daily. 60 capsule 4   EPINEPHrine 0.3 mg/0.3 mL IJ SOAJ injection Inject 0.3 mg into the muscle once as needed for up to 2 doses (if worsening tongue swelling, SOB, hypoxia, or other concerns for progressive anaphylaxis). 2 each 2   ethyl chloride spray SPRAY A SMALL AMOUNT THREE TIMES A WEEK JUST PRIOR TO NEEDLE INSERTION (Patient taking differently: Apply 1 Application topically 3 (three) times a week.) 116 mL 11   fluticasone (FLONASE) 50 MCG/ACT nasal spray Place 1 spray into both nostrils as needed for allergies or rhinitis.     hydrOXYzine (ATARAX) 25 MG tablet Take 1-2 tablets (25-50 mg total) by mouth at bedtime as needed for anxiety/insomnia. 60 tablet 1   mexiletine (MEXITIL) 150 MG capsule Take 2 capsules (300 mg total) by mouth every 12 (twelve) hours. 120 capsule 6   pantoprazole (PROTONIX) 40 MG tablet Take 1 tablet (40 mg total) by mouth daily. 30 tablet 6   polyethylene glycol (MIRALAX) 17 g packet Take 17 g by mouth daily. Titrate up as needed 14 each 0   predniSONE (DELTASONE) 10 MG tablet Take 3 tablets (30 mg total) by  mouth daily with breakfast. 90 tablet 2   Rectal Protectant-Emollient (CALMOL-4) 76-10 % SUPP Use as directed once to twice daily (Patient taking differently: Place 1 suppository rectally 2 (two) times daily as needed (constipation). Use as directed once to twice daily) 6 suppository 2   sulfamethoxazole-trimethoprim (BACTRIM) 400-80 MG tablet Take 1 tablet by mouth 3 (three) times a week. Will take as long as you are on prednisone 36 tablet 0   SYMBICORT 80-4.5 MCG/ACT inhaler INHALE TWO PUFFS INTO THE LUNGS TWICE DAILY (BULK) (Patient taking differently: Inhale 2 puffs into the lungs in the morning and at bedtime.) 10.2 g 6   VELPHORO 500 MG chewable tablet Chew 1,000 mg by mouth 3 (three) times daily.     No current facility-administered medications for this encounter.   Wt Readings from Last 3 Encounters:  09/24/22 114.3 kg (252 lb)  09/16/22 113 kg (249 lb 3.2 oz)  09/10/22 111.5 kg (245 lb 13 oz)   BP 124/70   Pulse 74   Wt 114.3 kg (252 lb)   SpO2 98%   BMI 39.47 kg/m  General:  NAD. No resp difficulty, walked into clinic. HEENT: Normal Neck: Supple. No JVD, thick neck Carotids 2+ bilat; no bruits. No lymphadenopathy or thryomegaly appreciated. Cor: PMI nondisplaced. Regular rate & rhythm. No rubs, gallops or murmurs. Lungs: Clear Abdomen: Obese, soft, nontender, nondistended. No hepatosplenomegaly. No bruits or masses. Good bowel sounds. Extremities: No cyanosis, clubbing, rash, edema; RUE AVF +/+ Neuro: Alert & oriented x 3, cranial nerves grossly intact. Moves all 4 extremities w/o difficulty. Affect pleasant.  Assessment/Plan: 1. VT storm: Has has a Animator ICD.  Recent admit with refractory monomorphic VT. Multiple shocks.  Later alternating NSR with a slow wide complex AIVR-like rhythm. Recurrent monomorphic VT 9/24 w/ repeated shocks>>re-intubated, rebolused with amio, quinidine discontinued, started on procainamide.  Given concern for possible cardiac sarcoid, started  empirically on prednisone. HiRes Chest CT this admit with no evidence of pulmonary sarcoid. Cath this admit with nonobstructive CAD. Echo EF 20-25%, GIIIDD (restrictive), RV normal. Recurrent VT on 09/07/22 with ICD shock.  He is on amiodarone + mexiletine 300 bid + Coreg 12.5 mg bid.   - EP has decided against VT ablation, do not think it would be likely to be successful and very high risk. With VT again 09/07/22, Dr. Aundra Dubin offered transfer to [facility] for another opinion on ablation (and potentially epicardial ablation) but with the caveat that it is still very high risk and of unlikely success.  He was not interested in this.  ICD is now de-activated. He has felt palpitations recently, no syncope. - Increase Coreg to 18/75 mg bid. - Continue amiodarone taper. Due to decrease to 400 mg bid today. - Continue mexiletine 300 mg bid. Last Mag 2.0, K 4.2 - Long term very concerning, not candidate for LVAD with ESRD.   - Continue empiric prednisone 30 mg daily with Bactrim SS three times/week.  He was on Solumedrol when nauseated. Discussed with Dr. Aundra Dubin. - Arrange for cardiac PET at [facility] for evaluate for cardiac sarcoidosis. - He has follow up with Dr. Lovena Le today. 2. Chronic systolic CHF: H/o NICM. Recent shock in setting of incessant VT. Echo this admission (9/23) with EF 20-25%, normal RV function, mild-moderate MR, IVC dilated.  At last admission, unable to complete cardiac MRI due to artifact from [facility] ICD.  PYP scan was negative.  RHC this admit (9/23) showed volume overload>>CRRT for volume removal.  Now volume management via iHD. Today, NYHA II-early III, limited mostly by fatigue.  - Increase Coreg as above.   3. CAD: ?PCI in 2005, unsure what vessel.  Coronary angiography in 8/22 showed no significant CAD and also does not appear to show prior stent so h/o CAD is questionable. LHC this admission (9/23) with nonobstructive CAD.  - Continue statin. - No ASA with anticoagulation.  4. ESRD: Since 2/22.  TTS HD.  - Tolerating well. - Nephrology following. 5. Atrial fibrillation: Paroxysmal, noted this admission. Has also had AFL.  Back to NSR today. - Remains on amiodarone. - Continue Eliquis. No bleeding issues. 6. GOC: He was DNR/DNI during admission, and ICD off given intractable VT with no good long-term options.  Will continue aggressive medical management, but no plan for ablation or further invasive intervention.    Will try to expedite cardiac PET at [facility]. Follow up in 6-8 weeks with Dr. Aundra Dubin.  [place] FNP 09/24/2022

## 2022-09-24 NOTE — Telephone Encounter (Signed)
Patient called wanting to know:  1.) can he be sexually active and if so how much?  2.) right sided chest pain/flutter off and on and wants to know what would cause this  3.) the amiodarone makes him feel like he has been hit in his lip, wanting to know if he should stop this medication

## 2022-09-24 NOTE — Addendum Note (Signed)
Encounter addended by: Rafael Bihari, FNP on: 09/24/2022 1:21 PM  Actions taken: Clinical Note Signed

## 2022-09-24 NOTE — Progress Notes (Signed)
HPI Victor Thompson returns today for followup. He is a pleasant 61 yo man with a non-ischemic CM, ESRD on HD, HTN, and VT, s/p S-ICD insertion. He had incessant VT and multiple ICD shocks while in the hospital and his device was turned off and palliative care involved. He was placed on even more amiodarone and presents for additional evaluation. He denies more shocks. He has had some palpitations but no syncope. He is a bit agitated at times at home. He denies chest pain, sob, or cough. He has some gum swelling.  Allergies  Allergen Reactions   Ace Inhibitors Swelling    Swelling of the tongue   Influenza Vaccines Hives and Swelling    SWELLING REACTION UNSPECIFIED    Ketorolac Swelling    SWELLING REACTION UNSPECIFIED    Lidocaine Swelling    TONGUE SWELLS   Lisinopril Swelling    TONGUE SWELLING Pt reported problem with a BP med which sounded like  lisinopril  But as of 09/19/06,pt had tolerated altace without problem   Penicillins Swelling    TONGUE SWELLS Has patient had a PCN reaction causing immediate rash, facial/tongue/throat swelling, SOB or lightheadedness with hypotension: Yes Has patient had a PCN reaction causing severe rash involving mucus membranes or skin necrosis: No Has patient had a PCN reaction that required hospitalization No Has patient had a PCN reaction occurring within the last 10 years: Yes If all of the above answers are "NO", then may proceed with Cephalosporin use.      Current Outpatient Medications  Medication Sig Dispense Refill   albuterol (PROVENTIL) (2.5 MG/3ML) 0.083% nebulizer solution TAKE 3 MLS (2.5 MG TOTAL) BY NEBULIZATION EVERY 6 (SIX) HOURS AS NEEDED FOR WHEEZING OR SHORTNESS OF BREATH. 75 mL 12   albuterol (VENTOLIN HFA) 108 (90 Base) MCG/ACT inhaler Inhale 2 puffs into the lungs every 4 (four) hours as needed for wheezing or shortness of breath. 8 g 6   amiodarone (PACERONE) 200 MG tablet Take 2 tablets (400 mg total) by mouth 2 (two)  times daily. 120 tablet 6   apixaban (ELIQUIS) 5 MG TABS tablet Take 1 tablet (5 mg total) by mouth 2 (two) times daily. 60 tablet 6   atorvastatin (LIPITOR) 80 MG tablet Take 1 tablet (80 mg total) by mouth at bedtime. 90 tablet 3   AURYXIA 1 GM 210 MG(Fe) tablet Take 1 tablet (210 mg total) by mouth 3 (three) times daily. 270 tablet 1   calcium acetate (PHOSLO) 667 MG capsule Take 2 capsule by mouth three times a day with meals (Patient taking differently: Take 1,334 mg by mouth 3 (three) times daily with meals.) 180 capsule 6   carvedilol (COREG) 12.5 MG tablet Take 1.5 tablets (18.75 mg total) by mouth 2 (two) times daily. 90 tablet 6   cetirizine (ZYRTEC) 10 MG tablet Take 1 tablet (10 mg total) by mouth 2 (two) times daily. 60 tablet 6   Cholecalciferol (VITAMIN D-3) 125 MCG (5000 UT) TABS Take 5,000 Units by mouth daily.     docusate sodium (COLACE) 100 MG capsule Take 1 capsule (100 mg total) by mouth 2 (two) times daily. 60 capsule 4   EPINEPHrine 0.3 mg/0.3 mL IJ SOAJ injection Inject 0.3 mg into the muscle once as needed for up to 2 doses (if worsening tongue swelling, SOB, hypoxia, or other concerns for progressive anaphylaxis). 2 each 2   ethyl chloride spray SPRAY A SMALL AMOUNT THREE TIMES A WEEK JUST PRIOR TO NEEDLE  INSERTION (Patient taking differently: Apply 1 Application topically 3 (three) times a week.) 116 mL 11   fluticasone (FLONASE) 50 MCG/ACT nasal spray Place 1 spray into both nostrils as needed for allergies or rhinitis.     hydrALAZINE (APRESOLINE) 25 MG tablet Take 75 mg by mouth 3 (three) times daily.     hydrOXYzine (ATARAX) 25 MG tablet Take 1-2 tablets (25-50 mg total) by mouth at bedtime as needed for anxiety/insomnia. 60 tablet 1   isosorbide mononitrate (IMDUR) 60 MG 24 hr tablet Take 60 mg by mouth every morning.     mexiletine (MEXITIL) 150 MG capsule Take 2 capsules (300 mg total) by mouth every 12 (twelve) hours. 120 capsule 6   pantoprazole (PROTONIX) 40 MG  tablet Take 1 tablet (40 mg total) by mouth daily. 30 tablet 6   polyethylene glycol (MIRALAX) 17 g packet Take 17 g by mouth daily. Titrate up as needed 14 each 0   predniSONE (DELTASONE) 10 MG tablet Take 3 tablets (30 mg total) by mouth daily with breakfast. 90 tablet 2   Rectal Protectant-Emollient (CALMOL-4) 76-10 % SUPP Use as directed once to twice daily (Patient taking differently: Place 1 suppository rectally 2 (two) times daily as needed (constipation). Use as directed once to twice daily) 6 suppository 2   sildenafil (VIAGRA) 50 MG tablet Take 50 mg by mouth daily as needed.     sulfamethoxazole-trimethoprim (BACTRIM) 400-80 MG tablet Take 1 tablet by mouth 3 (three) times a week. Will take as long as you are on prednisone 36 tablet 0   SYMBICORT 80-4.5 MCG/ACT inhaler INHALE TWO PUFFS INTO THE LUNGS TWICE DAILY (BULK) (Patient taking differently: Inhale 2 puffs into the lungs in the morning and at bedtime.) 10.2 g 6   VELPHORO 500 MG chewable tablet Chew 1,000 mg by mouth 3 (three) times daily.     No current facility-administered medications for this visit.     Past Medical History:  Diagnosis Date   Acute on chronic systolic CHF (congestive heart failure) ([place]) 06/14/2020   Acute respiratory failure ([place]) 05/28/2022   AICD (automatic cardioverter/defibrillator) present    2015   Allergy    Asthma    no meds   Cardiac arrest [facility]) 2015   Chest pain 05/25/2022   CHF (congestive heart failure) (HCC)    Coronary artery disease    ESRD on hemodialysis (HCC)    ckd -stage 5   Hyperlipidemia    Hypertension    Myocardial infarction ([place])    Obesity    Pneumonia    Shortness of breath dyspnea    Sleep apnea    USES CPAP   Wears glasses     ROS:   All systems reviewed and negative except as noted in the HPI.   Past Surgical History:  Procedure Laterality Date   AV FISTULA PLACEMENT Right 03/15/2019   Procedure: RIGHT ARM ARTERIOVENOUS (AV) FISTULA CREATION;  Surgeon:  Angelia Mould, MD;  Location: [place];  Service: Vascular;  Laterality: Right;   COLONOSCOPY     COLONOSCOPY W/ BIOPSIES AND POLYPECTOMY     CORONARY STENT PLACEMENT  2007   IMPLANTABLE CARDIOVERTER DEFIBRILLATOR IMPLANT  2015   RIGHT/LEFT HEART CATH AND CORONARY ANGIOGRAPHY N/A 07/08/2021   Procedure: RIGHT/LEFT HEART CATH AND CORONARY ANGIOGRAPHY;  Surgeon: Larey Dresser, MD;  Location: [place] CV LAB;  Service: Cardiovascular;  Laterality: N/A;   RIGHT/LEFT HEART CATH AND CORONARY ANGIOGRAPHY N/A 08/25/2022   Procedure: RIGHT/LEFT HEART CATH  AND CORONARY ANGIOGRAPHY;  Surgeon: Larey Dresser, MD;  Location: [place] CV LAB;  Service: Cardiovascular;  Laterality: N/A;   SUBQ ICD CHANGEOUT N/A 10/13/2020   Procedure: SUBQ ICD CHANGEOUT;  Surgeon: Evans Lance, MD;  Location: [place] CV LAB;  Service: Cardiovascular;  Laterality: N/A;     Family History  Problem Relation Age of Onset   Hypertension Mother    Liver disease Father    Colon cancer Neg Hx    Esophageal cancer Neg Hx    Rectal cancer Neg Hx    Stomach cancer Neg Hx      Social History   Socioeconomic History   Marital status: Single    Spouse name: Not on file   Number of children: 3   Years of education: Not on file   Highest education level: Not on file  Occupational History   Occupation: retired  Tobacco Use   Smoking status: Never   Smokeless tobacco: Never  Vaping Use   Vaping Use: Never used  Substance and Sexual Activity   Alcohol use: No   Drug use: No   Sexual activity: Yes  Other Topics Concern   Not on file  Social History Narrative   ** Merged History Encounter **       Social Determinants of Health   Financial Resource Strain: Not on file  Food Insecurity: No Food Insecurity (08/28/2022)   Hunger Vital Sign    Worried About Running Out of Food in the Last Year: Never true    Ran Out of Food in the Last Year: Never true  Transportation Needs: No Transportation Needs  (09/15/2022)   PRAPARE - Hydrologist (Medical): No    Lack of Transportation (Non-Medical): No  Physical Activity: Insufficiently Active (08/20/2022)   Exercise Vital Sign    Days of Exercise per Week: 1 day    Minutes of Exercise per Session: 10 min  Stress: Not on file  Social Connections: Not on file  Intimate Partner Violence: Not At Risk (08/28/2022)   Humiliation, Afraid, Rape, and Kick questionnaire    Fear of Current or Ex-Partner: No    Emotionally Abused: No    Physically Abused: No    Sexually Abused: No     BP 138/78   Pulse 74   Ht '5\' 7"'$  (1.702 m)   Wt 253 lb (114.8 kg)   SpO2 93%   BMI 39.63 kg/m   Physical Exam:  Well appearing NAD HEENT: Unremarkable Neck:  No JVD, no thyromegally Lymphatics:  No adenopathy Back:  No CVA tenderness Lungs:  Clear with no wheezes HEART:  Regular rate rhythm, no murmurs, no rubs, no clicks Abd:  soft, positive bowel sounds, no organomegally, no rebound, no guarding Ext:  2 plus pulses, no edema, no cyanosis, no clubbing Skin:  No rashes no nodules Neuro:  CN II through XII intact, motor grossly intact  DEVICE  Normal device function.  See PaceArt for details. Device turned off.  Assess/Plan:  VT storm - he does not want any more shocks and will continue to keep his SICD turned off. He will reduce his dose of amio to 400 bid. I will reduce further when I see him back in 6 weeks.  Possible sarcoid CM - he has been on high dose prednisone without a clear diagnosis. He is a bit agitated and having trouble sleeping and I plan to reduce the dose of prednisone to 20 mg daily. Chronic  systolic heart failure - he is euvolemic on HD 3 times a week. HTN - his bp is controlled.   Victor Overlie Kyeisha Janowicz,MD

## 2022-09-24 NOTE — Telephone Encounter (Signed)
   Victor Thompson wth [name] PT is calling with some concerns regarding the pt Pt is not taking Rx #: [id number] cetirizine (ZYRTEC) 10 MG tablet [[id number]] Pt reports that he is unable to pay for the medication. Pt has questions about sexual activity affecting his heart. Please advise with the patient.  Pt reports pain in right chest declined going to the ED- pt took MOM. Pain resolved. Intermittent has a dull ache under right arm 1 out 10. Pt reports hx of bleeding hemmorids. Currently prescribed to miralex and colace. Pt is asking can he use MOM instead as he feels it is most effective? Intermittent fluttering in chest not connected with activity or time of day.  Patient was seen by cardiologist  today-vitals WNL, pain 1 out 10- pain is not the patient's concern today.  Patient would like to know if he  can change from Miralax to MOM- he feels it works better- patient reports Miralax- once/week and Colace 2 times/day

## 2022-09-24 NOTE — Patient Instructions (Addendum)
Medication Instructions:  Your physician has recommended you make the following change in your medication:  TAKE Amiodarone as follows -- take 2 tablets (400 mg total) TWICE daily TAKE Prednisone as follows -- take 2 tablets (20 mg total) ONCE daily  *If you need a refill on your cardiac medications before your next appointment, please call your pharmacy*   Lab Work: None ordered   Testing/Procedures: None ordered   Follow-Up: At [facility], you and your health needs are our priority.  As part of our continuing mission to provide you with exceptional heart care, we have created designated Provider Care Teams.  These Care Teams include your primary Cardiologist (physician) and Advanced Practice Providers (APPs -  Physician Assistants and Nurse Practitioners) who all work together to provide you with the care you need, when you need it.  Your next appointment:   December    The format for your next appointment:   In Person  Provider:   Cristopher Peru, MD    Thank you for choosing [facility]!!   [phone]  Other Instructions   Important Information About Sugar

## 2022-09-24 NOTE — Telephone Encounter (Signed)
Patient advised and verbalized understanding 

## 2022-09-25 ENCOUNTER — Other Ambulatory Visit: Payer: Self-pay

## 2022-09-25 ENCOUNTER — Emergency Department (HOSPITAL_COMMUNITY): Payer: Medicare Other

## 2022-09-25 ENCOUNTER — Emergency Department (HOSPITAL_COMMUNITY)
Admission: EM | Admit: 2022-09-25 | Discharge: 2022-09-25 | Disposition: A | Discharge status: Home / Self Care | Payer: Medicare Other | Attending: Emergency Medicine | Admitting: Emergency Medicine

## 2022-09-25 ENCOUNTER — Encounter (HOSPITAL_COMMUNITY): Payer: Self-pay | Admitting: Emergency Medicine

## 2022-09-25 DIAGNOSIS — I10 Essential (primary) hypertension: Secondary | ICD-10-CM | POA: Diagnosis not present

## 2022-09-25 DIAGNOSIS — N186 End stage renal disease: Secondary | ICD-10-CM | POA: Diagnosis not present

## 2022-09-25 DIAGNOSIS — M25511 Pain in right shoulder: Secondary | ICD-10-CM | POA: Diagnosis not present

## 2022-09-25 DIAGNOSIS — R079 Chest pain, unspecified: Secondary | ICD-10-CM | POA: Diagnosis not present

## 2022-09-25 DIAGNOSIS — Z992 Dependence on renal dialysis: Secondary | ICD-10-CM | POA: Diagnosis not present

## 2022-09-25 DIAGNOSIS — N2581 Secondary hyperparathyroidism of renal origin: Secondary | ICD-10-CM | POA: Diagnosis not present

## 2022-09-25 DIAGNOSIS — M79601 Pain in right arm: Secondary | ICD-10-CM | POA: Diagnosis not present

## 2022-09-25 DIAGNOSIS — D509 Iron deficiency anemia, unspecified: Secondary | ICD-10-CM | POA: Diagnosis not present

## 2022-09-25 DIAGNOSIS — Z7901 Long term (current) use of anticoagulants: Secondary | ICD-10-CM | POA: Insufficient documentation

## 2022-09-25 DIAGNOSIS — M19011 Primary osteoarthritis, right shoulder: Secondary | ICD-10-CM | POA: Diagnosis not present

## 2022-09-25 DIAGNOSIS — J90 Pleural effusion, not elsewhere classified: Secondary | ICD-10-CM | POA: Diagnosis not present

## 2022-09-25 DIAGNOSIS — R52 Pain, unspecified: Secondary | ICD-10-CM | POA: Diagnosis not present

## 2022-09-25 DIAGNOSIS — D631 Anemia in chronic kidney disease: Secondary | ICD-10-CM | POA: Diagnosis not present

## 2022-09-25 LAB — CBC WITH DIFFERENTIAL/PLATELET
Abs Immature Granulocytes: 0.05 10*3/uL (ref 0.00–0.07)
Basophils Absolute: 0 10*3/uL (ref 0.0–0.1)
Basophils Relative: 0 %
Eosinophils Absolute: 0.1 10*3/uL (ref 0.0–0.5)
Eosinophils Relative: 1 %
HCT: 33.7 % — ABNORMAL LOW (ref 39.0–52.0)
Hemoglobin: 11 g/dL — ABNORMAL LOW (ref 13.0–17.0)
Immature Granulocytes: 1 %
Lymphocytes Relative: 8 %
Lymphs Abs: 0.5 10*3/uL — ABNORMAL LOW (ref 0.7–4.0)
MCH: 32.8 pg (ref 26.0–34.0)
MCHC: 32.6 g/dL (ref 30.0–36.0)
MCV: 100.6 fL — ABNORMAL HIGH (ref 80.0–100.0)
Monocytes Absolute: 0.6 10*3/uL (ref 0.1–1.0)
Monocytes Relative: 8 %
Neutro Abs: 5.7 10*3/uL (ref 1.7–7.7)
Neutrophils Relative %: 82 %
Platelets: 204 10*3/uL (ref 150–400)
RBC: 3.35 MIL/uL — ABNORMAL LOW (ref 4.22–5.81)
RDW: 17.1 % — ABNORMAL HIGH (ref 11.5–15.5)
WBC: 7 10*3/uL (ref 4.0–10.5)
nRBC: 0.3 % — ABNORMAL HIGH (ref 0.0–0.2)

## 2022-09-25 LAB — COMPREHENSIVE METABOLIC PANEL
ALT: 36 U/L (ref 0–44)
AST: 20 U/L (ref 15–41)
Albumin: 3.1 g/dL — ABNORMAL LOW (ref 3.5–5.0)
Alkaline Phosphatase: 82 U/L (ref 38–126)
Anion gap: 12 (ref 5–15)
BUN: 13 mg/dL (ref 6–20)
CO2: 34 mmol/L — ABNORMAL HIGH (ref 22–32)
Calcium: 7.9 mg/dL — ABNORMAL LOW (ref 8.9–10.3)
Chloride: 95 mmol/L — ABNORMAL LOW (ref 98–111)
Creatinine, Ser: 4.58 mg/dL — ABNORMAL HIGH (ref 0.61–1.24)
GFR, Estimated: 14 mL/min — ABNORMAL LOW (ref >60)
Glucose, Bld: 127 mg/dL — ABNORMAL HIGH (ref 70–99)
Potassium: 3.2 mmol/L — ABNORMAL LOW (ref 3.5–5.1)
Sodium: 141 mmol/L (ref 135–145)
Total Bilirubin: 0.3 mg/dL (ref 0.3–1.2)
Total Protein: 6.6 g/dL (ref 6.5–8.1)

## 2022-09-25 LAB — TROPONIN I (HIGH SENSITIVITY): Troponin I (High Sensitivity): 55 ng/L — ABNORMAL HIGH (ref <18)

## 2022-09-25 MED ORDER — ACETAMINOPHEN 500 MG PO TABS
1000.0000 mg | ORAL_TABLET | Freq: Once | ORAL | Status: AC
  Administered 2022-09-25: 1000 mg via ORAL
  Filled 2022-09-25: qty 2

## 2022-09-25 NOTE — ED Provider Notes (Cosign Needed Addendum)
[facility] EMERGENCY DEPARTMENT Provider Note   CSN: [id number] Arrival date & time: 09/25/22  1100     History  Chief Complaint  Patient presents with   Shoulder Pain    Victor Thompson is a 61 y.o. male.  Patient with history of end-stage renal disease, recent admission for VT storm --presents to the emergency department for evaluation of right anterior shoulder pain.  He states that this has been going on for about 4 days.  It is described as throbbing and dull.  It is intermittent in nature.  It radiates into his upper arm.  Sometimes it wakes him up at night and then eases off.  No swelling of the arm.  No numbness, tingling or weakness of the hand or wrist.  Patient's dialysis access is in the right upper extremity.  No issues recently with dialysis.  No neck pain.  No falls or other injuries.  He has applied topical medication with some improvement.  No oral medications.  He has to avoid NSAIDs due to anticoagulation use.  No chest pain or shortness of breath.       Home Medications Prior to Admission medications   Medication Sig Start Date End Date Taking? Authorizing Provider  albuterol (PROVENTIL) (2.5 MG/3ML) 0.083% nebulizer solution TAKE 3 MLS (2.5 MG TOTAL) BY NEBULIZATION EVERY 6 (SIX) HOURS AS NEEDED FOR WHEEZING OR SHORTNESS OF BREATH. 12/30/21 12/30/22  [name], MD  albuterol (VENTOLIN HFA) 108 (90 Base) MCG/ACT inhaler Inhale 2 puffs into the lungs every 4 (four) hours as needed for wheezing or shortness of breath. 10/01/19   [name], MD  amiodarone (PACERONE) 200 MG tablet Take 2 tablets (400 mg total) by mouth 2 (two) times daily. 09/24/22   Milford, Maricela Bo, FNP  apixaban (ELIQUIS) 5 MG TABS tablet Take 1 tablet (5 mg total) by mouth 2 (two) times daily. 09/10/22   Shirley Friar, PA-C  atorvastatin (LIPITOR) 80 MG tablet Take 1 tablet (80 mg total) by mouth at bedtime. 06/25/22 06/25/23  Larey Dresser, MD  AURYXIA  1 GM 210 MG(Fe) tablet Take 1 tablet (210 mg total) by mouth 3 (three) times daily. 08/12/21   [name], MD  calcium acetate (PHOSLO) 667 MG capsule Take 2 capsule by mouth three times a day with meals Patient taking differently: Take 1,334 mg by mouth 3 (three) times daily with meals. 06/09/21     carvedilol (COREG) 12.5 MG tablet Take 1.5 tablets (18.75 mg total) by mouth 2 (two) times daily. 09/24/22 04/22/23  Rafael Bihari, FNP  cetirizine (ZYRTEC) 10 MG tablet Take 1 tablet (10 mg total) by mouth 2 (two) times daily. 09/24/22   Milford, Maricela Bo, FNP  Cholecalciferol (VITAMIN D-3) 125 MCG (5000 UT) TABS Take 5,000 Units by mouth daily.    [provider]  docusate sodium (COLACE) 100 MG capsule Take 1 capsule (100 mg total) by mouth 2 (two) times daily. 02/15/22   [name], MD  EPINEPHrine 0.3 mg/0.3 mL IJ SOAJ injection Inject 0.3 mg into the muscle once as needed for up to 2 doses (if worsening tongue swelling, SOB, hypoxia, or other concerns for progressive anaphylaxis). 07/15/21   [name], MD  ethyl chloride spray SPRAY A SMALL AMOUNT THREE TIMES A WEEK JUST PRIOR TO NEEDLE INSERTION Patient taking differently: Apply 1 Application topically 3 (three) times a week. 10/26/21   [name], MD  fluticasone (FLONASE) 50 MCG/ACT nasal  spray Place 1 spray into both nostrils as needed for allergies or rhinitis.    [provider]  hydrALAZINE (APRESOLINE) 25 MG tablet Take 75 mg by mouth 3 (three) times daily. 09/06/22   [provider]  hydrOXYzine (ATARAX) 25 MG tablet Take 1-2 tablets (25-50 mg total) by mouth at bedtime as needed for anxiety/insomnia. 09/13/22   [name], MD  isosorbide mononitrate (IMDUR) 60 MG 24 hr tablet Take 60 mg by mouth every morning. 09/06/22   [provider]  mexiletine (MEXITIL) 150 MG capsule Take 2 capsules (300 mg total) by mouth every 12 (twelve) hours. 09/10/22   Shirley Friar, PA-C  pantoprazole (PROTONIX) 40 MG tablet Take 1 tablet (40 mg total) by mouth daily. 09/11/22   Shirley Friar, PA-C  polyethylene glycol (MIRALAX) 17 g packet Take 17 g by mouth daily. Titrate up as needed 05/05/22   Armbruster, Carlota Raspberry, MD  predniSONE (DELTASONE) 10 MG tablet Take 2 tablets (20 mg total) by mouth daily with breakfast. 09/24/22   Evans Lance, MD  Rectal Protectant-Emollient (CALMOL-4) 76-10 % SUPP Use as directed once to twice daily Patient taking differently: Place 1 suppository rectally 2 (two) times daily as needed (constipation). Use as directed once to twice daily 06/18/22   [name], MD  sildenafil (VIAGRA) 50 MG tablet Take 50 mg by mouth daily as needed. 09/06/22   [provider]  sulfamethoxazole-trimethoprim (BACTRIM) 400-80 MG tablet Take 1 tablet by mouth 3 (three) times a week. Will take as long as you are on prednisone 09/13/22 12/12/22  Shirley Friar, PA-C  SYMBICORT 80-4.5 MCG/ACT inhaler INHALE TWO PUFFS INTO THE LUNGS TWICE DAILY (BULK) Patient taking differently: Inhale 2 puffs into the lungs in the morning and at bedtime. 03/10/22   [name], MD  VELPHORO 500 MG chewable tablet Chew 1,000 mg by mouth 3 (three) times daily. 08/06/22   [provider]      Allergies    Ace inhibitors, Influenza vaccines, Ketorolac, Lidocaine, Lisinopril, and Penicillins    Review of Systems   Review of Systems  Physical Exam Updated Vital Signs BP 128/68 (BP Location: Right Arm)   Pulse 70   Temp 98.1 F (36.7 C) (Oral)   Resp 16   SpO2 96%   Physical Exam Vitals and nursing note reviewed.  Constitutional:      Appearance: He is well-developed.  HENT:     Head: Normocephalic and atraumatic.  Eyes:     Conjunctiva/sclera: Conjunctivae normal.  Cardiovascular:     Pulses: Normal pulses. No decreased pulses.     Comments: 2+ radial pulse in the right wrist.  Right upper extremity dialysis access with  palpable thrill. Musculoskeletal:        General: Tenderness present.     Right shoulder: Tenderness (Anterior to palpation only) present. No swelling or bony tenderness. Normal range of motion.     Right upper arm: No edema or tenderness.     Right elbow: Normal range of motion. No tenderness.     Right wrist: No tenderness. Normal range of motion.     Cervical back: Normal range of motion and neck supple. No tenderness. Normal range of motion.     Right lower leg: No edema.     Left lower leg: No edema.     Comments: Full range of motion of the right shoulder without difficulty.  Dialysis access right upper extremity.  Skin:  General: Skin is warm and dry.  Neurological:     Mental Status: He is alert.     Sensory: No sensory deficit.     Comments: Motor, sensation, and vascular distal to the injury is fully intact.   Psychiatric:        Mood and Affect: Mood normal.     ED Results / Procedures / Treatments   Labs (all labs ordered are listed, but only abnormal results are displayed) Labs Reviewed  COMPREHENSIVE METABOLIC PANEL - Abnormal; Notable for the following components:      Result Value   Potassium 3.2 (*)    Chloride 95 (*)    CO2 34 (*)    Glucose, Bld 127 (*)    Creatinine, Ser 4.58 (*)    Calcium 7.9 (*)    Albumin 3.1 (*)    GFR, Estimated 14 (*)    All other components within normal limits  CBC WITH DIFFERENTIAL/PLATELET - Abnormal; Notable for the following components:   RBC 3.35 (*)    Hemoglobin 11.0 (*)    HCT 33.7 (*)    MCV 100.6 (*)    RDW 17.1 (*)    nRBC 0.3 (*)    Lymphs Abs 0.5 (*)    All other components within normal limits  TROPONIN I (HIGH SENSITIVITY) - Abnormal; Notable for the following components:   Troponin I (High Sensitivity) 55 (*)    All other components within normal limits  TROPONIN I (HIGH SENSITIVITY)    EKG None  Radiology DG Chest 1 View  Result Date: 09/25/2022 CLINICAL DATA:  Right arm pain. EXAM: CHEST  1  VIEW COMPARISON:  Chest CT September 06, 2022 and chest radiograph September 03, 2022. FINDINGS: Subcutaneous cardiac generator with unchanged position of the lead. Similar enlargement of the cardiac silhouette with central vascular prominence. Small left pleural effusion with bibasilar and perihilar predominant interstitial opacities. No acute osseous abnormality. IMPRESSION: Cardiomegaly with possible mild pulmonary edema and small left pleural effusion. Electronically Signed   By: Dahlia Bailiff M.D.   On: 09/25/2022 13:27   DG Shoulder Right  Result Date: 09/25/2022 CLINICAL DATA:  Shoulder pain radiating down of the right humerus. EXAM: RIGHT SHOULDER - 2+ VIEW COMPARISON:  Chest radiograph September 03, 2022. FINDINGS: There is no evidence of acute fracture or dislocation. Irregularity along the lateral aspect of the right humeral head may reflect sequela of prior dislocation. Glenohumeral and acromioclavicular degenerative change. Soft tissues are unremarkable. IMPRESSION: 1. No acute fracture or dislocation. 2. Degenerative change of the acromioclavicular and glenohumeral joints. 3. Irregularity along the lateral aspect of the right humeral head may be degenerative or reflect sequela of prior anterior shoulder dislocation. Electronically Signed   By: Dahlia Bailiff M.D.   On: 09/25/2022 13:25    Procedures Procedures    Medications Ordered in ED Medications  acetaminophen (TYLENOL) tablet 1,000 mg (has no administration in time range)    ED Course/ Medical Decision Making/ A&P    Patient seen and examined. History obtained directly from patient. Reviewed previous hospitalization notes as well as recent EP note.  Work-up including labs, imaging, EKG ordered in triage, if performed, were reviewed.    Labs/EKG: Independently reviewed and interpreted.  This included: CBC with hemoglobin of 11 normal white blood cell count otherwise unremarkable; CMP with potassium 3.2 today, BUN normal at 13;  troponin 55.  Imaging: Independently visualized and interpreted.  This included: X-ray of the shoulder, agree no acute fracture or dislocation; x-ray  of the chest agree no infiltrate.  Medications/Fluids: Ordered: Tylenol  Most recent vital signs reviewed and are as follows: BP 128/68 (BP Location: Right Arm)   Pulse 70   Temp 98.1 F (36.7 C) (Oral)   Resp 16   SpO2 96%   Initial impression: Anterior right shoulder pain, low concern for ACS, potential radicular pain.  Distal CMS is intact.  Plan: Discharge with orthopedic follow-up.  Patient does have tramadol at home that he has taken in the past.  I encouraged him to take Tylenol, continue avoidance of NSAIDs, topical medications as needed.  He will try to adjust the way he sleeps as he typically sleeps directly on the shoulder and pain is worse at night.  Follow-up: Orthopedic follow-up encouraged and provided.  Encouraged return to the emergency department with worsening uncontrolled pain, swelling of the arm, chest pain or shortness of breath.                            Medical Decision Making Risk OTC drugs.   Intermittent right anterior shoulder pain.  No evidence of ACS today.  Symptoms are not really suggestive of ACS.  Pain radiates into the arm.  Question acute shoulder pain versus radicular pain given the throbbing nature.  No emergent problems today suspected such as arterial insufficiency, DVT, cellulitis.  X-ray was negative for dislocation or fracture.        Final Clinical Impression(s) / ED Diagnoses Final diagnoses:  Right anterior shoulder pain    Rx / DC Orders ED Discharge Orders     None         Carlisle Cater, PA-C 09/25/22 1649    Carlisle Cater, PA-C 09/25/22 1650    Blanchie Dessert, MD 09/26/22 [phone]

## 2022-09-25 NOTE — Discharge Instructions (Signed)
Please read and follow all provided instructions.  Your diagnoses today include:  1. Right anterior shoulder pain     Tests performed today include: An x-ray of the affected area - does NOT show any broken bones Blood counts and electrolytes, cardiac enzymes: Did not show any new problems today Vital signs. See below for your results today.   Medications prescribed:  None  Take any prescribed medications only as directed.  Home care instructions:  Follow any educational materials contained in this packet Follow R.I.C.E. Protocol: R - rest your injury  I  - use ice on injury without applying directly to skin C - compress injury with bandage or splint E - elevate the injury as much as possible  Follow-up instructions: Please follow-up with your primary care provider or the provided orthopedic physician (bone specialist) if you continue to have significant pain in 1 week. In this case you may have a more severe injury that requires further care.   Return instructions:  Please return if your fingers are numb or tingling, appear gray or blue, or you have severe pain (also elevate the arm and loosen splint or wrap if you were given one) Please return to the Emergency Department if you experience worsening symptoms.  Please return if you have any other emergent concerns.  Additional Information:  Your vital signs today were: BP 128/68 (BP Location: Right Arm)   Pulse 70   Temp 98.1 F (36.7 C) (Oral)   Resp 16   SpO2 96%  If your blood pressure (BP) was elevated above 135/85 this visit, please have this repeated by your doctor within one month. --------------

## 2022-09-25 NOTE — ED Triage Notes (Signed)
Patient here c/o shooting pain from R elbow to shoulder that has intermittently been going on since Tuesday. The pain comes and goes. Patient has fistula in same side, had dialysis today w/ no issue. Dialysis tx on TueThursSat. Patient does not recall any trauma to explain this.

## 2022-09-27 ENCOUNTER — Other Ambulatory Visit: Payer: Self-pay

## 2022-09-27 ENCOUNTER — Telehealth: Payer: Self-pay | Admitting: Licensed Clinical Social Worker

## 2022-09-27 DIAGNOSIS — Z792 Long term (current) use of antibiotics: Secondary | ICD-10-CM | POA: Diagnosis not present

## 2022-09-27 DIAGNOSIS — I5084 End stage heart failure: Secondary | ICD-10-CM | POA: Diagnosis not present

## 2022-09-27 DIAGNOSIS — N186 End stage renal disease: Secondary | ICD-10-CM | POA: Diagnosis not present

## 2022-09-27 DIAGNOSIS — I472 Ventricular tachycardia, unspecified: Secondary | ICD-10-CM | POA: Diagnosis not present

## 2022-09-27 DIAGNOSIS — I132 Hypertensive heart and chronic kidney disease with heart failure and with stage 5 chronic kidney disease, or end stage renal disease: Secondary | ICD-10-CM | POA: Diagnosis not present

## 2022-09-27 DIAGNOSIS — I051 Rheumatic mitral insufficiency: Secondary | ICD-10-CM | POA: Diagnosis not present

## 2022-09-27 DIAGNOSIS — I480 Paroxysmal atrial fibrillation: Secondary | ICD-10-CM | POA: Diagnosis not present

## 2022-09-27 DIAGNOSIS — Z7952 Long term (current) use of systemic steroids: Secondary | ICD-10-CM | POA: Diagnosis not present

## 2022-09-27 DIAGNOSIS — I4892 Unspecified atrial flutter: Secondary | ICD-10-CM | POA: Diagnosis not present

## 2022-09-27 DIAGNOSIS — I5023 Acute on chronic systolic (congestive) heart failure: Secondary | ICD-10-CM | POA: Diagnosis not present

## 2022-09-27 DIAGNOSIS — I951 Orthostatic hypotension: Secondary | ICD-10-CM | POA: Diagnosis not present

## 2022-09-27 DIAGNOSIS — I272 Pulmonary hypertension, unspecified: Secondary | ICD-10-CM | POA: Diagnosis not present

## 2022-09-27 DIAGNOSIS — Z7951 Long term (current) use of inhaled steroids: Secondary | ICD-10-CM | POA: Diagnosis not present

## 2022-09-27 DIAGNOSIS — Z789 Other specified health status: Secondary | ICD-10-CM

## 2022-09-27 DIAGNOSIS — E785 Hyperlipidemia, unspecified: Secondary | ICD-10-CM | POA: Diagnosis not present

## 2022-09-27 DIAGNOSIS — Z9981 Dependence on supplemental oxygen: Secondary | ICD-10-CM | POA: Diagnosis not present

## 2022-09-27 DIAGNOSIS — T82198D Other mechanical complication of other cardiac electronic device, subsequent encounter: Secondary | ICD-10-CM | POA: Diagnosis not present

## 2022-09-27 DIAGNOSIS — Z7901 Long term (current) use of anticoagulants: Secondary | ICD-10-CM | POA: Diagnosis not present

## 2022-09-27 DIAGNOSIS — I255 Ischemic cardiomyopathy: Secondary | ICD-10-CM | POA: Diagnosis not present

## 2022-09-27 DIAGNOSIS — J9601 Acute respiratory failure with hypoxia: Secondary | ICD-10-CM | POA: Diagnosis not present

## 2022-09-27 DIAGNOSIS — I2510 Atherosclerotic heart disease of native coronary artery without angina pectoris: Secondary | ICD-10-CM | POA: Diagnosis not present

## 2022-09-27 DIAGNOSIS — Z992 Dependence on renal dialysis: Secondary | ICD-10-CM | POA: Diagnosis not present

## 2022-09-27 DIAGNOSIS — G4733 Obstructive sleep apnea (adult) (pediatric): Secondary | ICD-10-CM | POA: Diagnosis not present

## 2022-09-28 ENCOUNTER — Telehealth: Payer: Self-pay

## 2022-09-28 ENCOUNTER — Other Ambulatory Visit: Payer: Self-pay

## 2022-09-28 DIAGNOSIS — N2581 Secondary hyperparathyroidism of renal origin: Secondary | ICD-10-CM | POA: Diagnosis not present

## 2022-09-28 DIAGNOSIS — N186 End stage renal disease: Secondary | ICD-10-CM | POA: Diagnosis not present

## 2022-09-28 DIAGNOSIS — Z992 Dependence on renal dialysis: Secondary | ICD-10-CM | POA: Diagnosis not present

## 2022-09-28 DIAGNOSIS — D509 Iron deficiency anemia, unspecified: Secondary | ICD-10-CM | POA: Diagnosis not present

## 2022-09-28 DIAGNOSIS — R52 Pain, unspecified: Secondary | ICD-10-CM | POA: Diagnosis not present

## 2022-09-28 DIAGNOSIS — D631 Anemia in chronic kidney disease: Secondary | ICD-10-CM | POA: Diagnosis not present

## 2022-09-28 NOTE — Telephone Encounter (Addendum)
Attempt to call patient to give message per Dr. [name].  Left message on voicemail to return call.   Patient to be informed of Dr. [name] message-   Let patient know that milk of magnesia is not recommended in patients with severe kidney disease or who are on dialysis.  In regards to his question about sexual activity affecting his heart, I would recommend that he ask his cardiologist about this.

## 2022-09-28 NOTE — Telephone Encounter (Signed)
Left message on voicemail to return call.

## 2022-09-28 NOTE — Telephone Encounter (Signed)
   Telephone encounter was:  Successful.  09/28/2022 Name: Victor Thompson MRN: [id number] DOB: [birth year]  Victor Thompson is a 61 y.o. year old male who is a primary care patient of [name], MD . The community resource team was consulted for assistance with  housing.  Care guide performed the following interventions: Spoke with patient verified email sent a list of income based housing for [facility]. Patient confirmed he received the email. Patient stated that he has the number for [employer].  Follow Up Plan:  No further follow up planned at this time. The patient has been provided with needed resources.  [place] Resource Care Guide   ??[email or web address]  ?? [phone]   Website: [email or web address]  [email or web address]

## 2022-09-28 NOTE — Patient Outreach (Signed)
  Care Coordination   Initial Visit Note   09/28/2022 Name: KARLA VINES MRN: [id number] DOB: [birth year]  THERON CUMBIE is a 61 y.o. year old male who sees [name], MD for primary care. I spoke with  [name] by phone today.  What matters to the patients health and wellness today?  Care Coordination/Housing    Goals Addressed             This Visit's Progress    LCSW Care Coordination-No Follow Up Required   On track    Care Coordination Interventions: Solution-Focused Strategies employed:  Active listening / Reflection utilized  Emotional Support Provided Verbalization of feelings encouraged  Made referral to Care Guide to assist with listing of income-based housing Pt agreed to contact pharmacy to obtain medication refills Pt reports decrease in severity of pain symptoms and continues to take Tylenol  Pt would like a referral to Orthopedic. LCSW encouraged pt to address with PCP, next scheduled appt on 10/21/22 Pt is residing with family and reports difficulty obtaining independent housing. He is on Section 8 waiting list         SDOH assessments and interventions completed:  No     Care Coordination Interventions Activated:  Yes  Care Coordination Interventions:  Yes, provided   Follow up plan: No further intervention required.   Encounter Outcome:  Pt. Visit Completed

## 2022-09-28 NOTE — Patient Instructions (Signed)
Visit Information  Thank you for taking time to visit with me today. Please don't hesitate to contact me if I can be of assistance to you.   Following are the goals we discussed today:   Goals Addressed             This Visit's Progress    LCSW Care Coordination-No Follow Up Required   On track    Care Coordination Interventions: Solution-Focused Strategies employed:  Active listening / Reflection utilized  Emotional Support Provided Verbalization of feelings encouraged  Made referral to Care Guide to assist with listing of income-based housing Pt agreed to contact pharmacy to obtain medication refills Pt reports decrease in severity of pain symptoms and continues to take Tylenol  Pt would like a referral to Orthopedic. LCSW encouraged pt to address with PCP, next scheduled appt on 10/21/22 Pt is residing with family and reports difficulty obtaining independent housing. He is on Section 8 waiting list        If you are experiencing a Mental Health or [place] or need someone to talk to, please call the Suicide and Crisis Lifeline: 988 call 911   Patient verbalizes understanding of instructions and care plan provided today and agrees to view in [place]. Active MyChart status and patient understanding of how to access instructions and care plan via MyChart confirmed with patient.     No further follow up required from [place], [email or web address], [email or web address] Phone [phone] 10:52 AM

## 2022-09-29 ENCOUNTER — Emergency Department (HOSPITAL_COMMUNITY)
Admission: EM | Admit: 2022-09-29 | Discharge: 2022-09-29 | Disposition: A | Discharge status: Home / Self Care | Payer: Medicare Other | Attending: Emergency Medicine | Admitting: Emergency Medicine

## 2022-09-29 ENCOUNTER — Telehealth: Payer: Self-pay | Admitting: Emergency Medicine

## 2022-09-29 ENCOUNTER — Other Ambulatory Visit: Payer: Self-pay

## 2022-09-29 ENCOUNTER — Encounter (HOSPITAL_COMMUNITY): Payer: Self-pay

## 2022-09-29 ENCOUNTER — Emergency Department (HOSPITAL_COMMUNITY): Payer: Medicare Other

## 2022-09-29 ENCOUNTER — Ambulatory Visit: Payer: Self-pay

## 2022-09-29 DIAGNOSIS — N186 End stage renal disease: Secondary | ICD-10-CM | POA: Diagnosis not present

## 2022-09-29 DIAGNOSIS — I4892 Unspecified atrial flutter: Secondary | ICD-10-CM | POA: Diagnosis not present

## 2022-09-29 DIAGNOSIS — Z7952 Long term (current) use of systemic steroids: Secondary | ICD-10-CM | POA: Diagnosis not present

## 2022-09-29 DIAGNOSIS — I255 Ischemic cardiomyopathy: Secondary | ICD-10-CM | POA: Diagnosis not present

## 2022-09-29 DIAGNOSIS — Z7951 Long term (current) use of inhaled steroids: Secondary | ICD-10-CM | POA: Diagnosis not present

## 2022-09-29 DIAGNOSIS — D649 Anemia, unspecified: Secondary | ICD-10-CM | POA: Insufficient documentation

## 2022-09-29 DIAGNOSIS — I5023 Acute on chronic systolic (congestive) heart failure: Secondary | ICD-10-CM | POA: Diagnosis not present

## 2022-09-29 DIAGNOSIS — R002 Palpitations: Secondary | ICD-10-CM | POA: Insufficient documentation

## 2022-09-29 DIAGNOSIS — T82198D Other mechanical complication of other cardiac electronic device, subsequent encounter: Secondary | ICD-10-CM | POA: Diagnosis not present

## 2022-09-29 DIAGNOSIS — Z792 Long term (current) use of antibiotics: Secondary | ICD-10-CM | POA: Diagnosis not present

## 2022-09-29 DIAGNOSIS — Z7901 Long term (current) use of anticoagulants: Secondary | ICD-10-CM | POA: Diagnosis not present

## 2022-09-29 DIAGNOSIS — I132 Hypertensive heart and chronic kidney disease with heart failure and with stage 5 chronic kidney disease, or end stage renal disease: Secondary | ICD-10-CM | POA: Diagnosis not present

## 2022-09-29 DIAGNOSIS — Z9981 Dependence on supplemental oxygen: Secondary | ICD-10-CM | POA: Diagnosis not present

## 2022-09-29 DIAGNOSIS — I472 Ventricular tachycardia, unspecified: Secondary | ICD-10-CM | POA: Diagnosis not present

## 2022-09-29 DIAGNOSIS — I051 Rheumatic mitral insufficiency: Secondary | ICD-10-CM | POA: Diagnosis not present

## 2022-09-29 DIAGNOSIS — I480 Paroxysmal atrial fibrillation: Secondary | ICD-10-CM | POA: Diagnosis not present

## 2022-09-29 DIAGNOSIS — Z95810 Presence of automatic (implantable) cardiac defibrillator: Secondary | ICD-10-CM | POA: Diagnosis not present

## 2022-09-29 DIAGNOSIS — E785 Hyperlipidemia, unspecified: Secondary | ICD-10-CM | POA: Diagnosis not present

## 2022-09-29 DIAGNOSIS — I2510 Atherosclerotic heart disease of native coronary artery without angina pectoris: Secondary | ICD-10-CM | POA: Diagnosis not present

## 2022-09-29 DIAGNOSIS — I951 Orthostatic hypotension: Secondary | ICD-10-CM | POA: Diagnosis not present

## 2022-09-29 DIAGNOSIS — I509 Heart failure, unspecified: Secondary | ICD-10-CM | POA: Insufficient documentation

## 2022-09-29 DIAGNOSIS — I272 Pulmonary hypertension, unspecified: Secondary | ICD-10-CM | POA: Diagnosis not present

## 2022-09-29 DIAGNOSIS — M47814 Spondylosis without myelopathy or radiculopathy, thoracic region: Secondary | ICD-10-CM | POA: Diagnosis not present

## 2022-09-29 DIAGNOSIS — Z992 Dependence on renal dialysis: Secondary | ICD-10-CM | POA: Insufficient documentation

## 2022-09-29 DIAGNOSIS — I5084 End stage heart failure: Secondary | ICD-10-CM | POA: Diagnosis not present

## 2022-09-29 DIAGNOSIS — R7989 Other specified abnormal findings of blood chemistry: Secondary | ICD-10-CM | POA: Insufficient documentation

## 2022-09-29 DIAGNOSIS — G4733 Obstructive sleep apnea (adult) (pediatric): Secondary | ICD-10-CM | POA: Diagnosis not present

## 2022-09-29 DIAGNOSIS — R0989 Other specified symptoms and signs involving the circulatory and respiratory systems: Secondary | ICD-10-CM | POA: Diagnosis not present

## 2022-09-29 DIAGNOSIS — Z79899 Other long term (current) drug therapy: Secondary | ICD-10-CM | POA: Diagnosis not present

## 2022-09-29 DIAGNOSIS — J9601 Acute respiratory failure with hypoxia: Secondary | ICD-10-CM | POA: Diagnosis not present

## 2022-09-29 LAB — CBC WITH DIFFERENTIAL/PLATELET
Abs Immature Granulocytes: 0.02 10*3/uL (ref 0.00–0.07)
Basophils Absolute: 0 10*3/uL (ref 0.0–0.1)
Basophils Relative: 0 %
Eosinophils Absolute: 0 10*3/uL (ref 0.0–0.5)
Eosinophils Relative: 0 %
HCT: 31.6 % — ABNORMAL LOW (ref 39.0–52.0)
Hemoglobin: 10.3 g/dL — ABNORMAL LOW (ref 13.0–17.0)
Immature Granulocytes: 0 %
Lymphocytes Relative: 11 %
Lymphs Abs: 0.6 10*3/uL — ABNORMAL LOW (ref 0.7–4.0)
MCH: 32.8 pg (ref 26.0–34.0)
MCHC: 32.6 g/dL (ref 30.0–36.0)
MCV: 100.6 fL — ABNORMAL HIGH (ref 80.0–100.0)
Monocytes Absolute: 0.8 10*3/uL (ref 0.1–1.0)
Monocytes Relative: 14 %
Neutro Abs: 4.1 10*3/uL (ref 1.7–7.7)
Neutrophils Relative %: 75 %
Platelets: 167 10*3/uL (ref 150–400)
RBC: 3.14 MIL/uL — ABNORMAL LOW (ref 4.22–5.81)
RDW: 17.4 % — ABNORMAL HIGH (ref 11.5–15.5)
WBC: 5.5 10*3/uL (ref 4.0–10.5)
nRBC: 0 % (ref 0.0–0.2)

## 2022-09-29 LAB — BASIC METABOLIC PANEL
Anion gap: 17 — ABNORMAL HIGH (ref 5–15)
BUN: 29 mg/dL — ABNORMAL HIGH (ref 6–20)
CO2: 29 mmol/L (ref 22–32)
Calcium: 7.8 mg/dL — ABNORMAL LOW (ref 8.9–10.3)
Chloride: 97 mmol/L — ABNORMAL LOW (ref 98–111)
Creatinine, Ser: 7.98 mg/dL — ABNORMAL HIGH (ref 0.61–1.24)
GFR, Estimated: 7 mL/min — ABNORMAL LOW (ref >60)
Glucose, Bld: 107 mg/dL — ABNORMAL HIGH (ref 70–99)
Potassium: 3.8 mmol/L (ref 3.5–5.1)
Sodium: 143 mmol/L (ref 135–145)

## 2022-09-29 LAB — BRAIN NATRIURETIC PEPTIDE: B Natriuretic Peptide: 431.5 pg/mL — ABNORMAL HIGH (ref 0.0–100.0)

## 2022-09-29 LAB — TROPONIN I (HIGH SENSITIVITY)
Troponin I (High Sensitivity): 50 ng/L — ABNORMAL HIGH (ref <18)
Troponin I (High Sensitivity): 52 ng/L — ABNORMAL HIGH (ref <18)

## 2022-09-29 MED ORDER — LINACLOTIDE 72 MCG PO CAPS: 72.0000 ug | ORAL_CAPSULE | ORAL | 1 refills | Status: DC

## 2022-09-29 MED ORDER — LINACLOTIDE 72 MCG PO CAPS
72.0000 ug | ORAL_CAPSULE | ORAL | 1 refills | Status: DC
  Filled 2022-09-29: qty 16, 28d supply, fill #0

## 2022-09-29 MED ORDER — ACETAMINOPHEN 500 MG PO TABS
1000.0000 mg | ORAL_TABLET | Freq: Once | ORAL | Status: AC
  Administered 2022-09-29: 1000 mg via ORAL
  Filled 2022-09-29: qty 2

## 2022-09-29 NOTE — Telephone Encounter (Signed)
Finally able to connect with patient today after several attempts. Patient aware of message per Dr. [name].   He would like Linzess sent to [facility].

## 2022-09-29 NOTE — Discharge Instructions (Signed)
You were seen in the ER today for your chest discomfort this evening.  Fortunately, your cardiac work-up in the ER tonight is very reassuring.  While the exact cause of the symptoms you experienced at home remain unclear does not appear to be related to your heart this evening.  Please follow-up with Dr. Lovena Le your cardiologist as previously scheduled and continue take your home medications as prescribed.  Return to the ER with any new severe symptoms.

## 2022-09-29 NOTE — ED Triage Notes (Signed)
Pt reports he was sleeping tonight when he was suddenly awaken by a "thump" he felt in his heart. He states it happened twice. He states his defibrillator has been turned off "a couple of weeks ago" because it kept going off. He denies chest pain/sob.   He is a dialysis patient, Tue/Thur/Sat. Last dialyzed Tuesday (09/28/22)

## 2022-09-29 NOTE — Telephone Encounter (Signed)
Yes ma'am, noted. Rx for Linzess resent to his preferred pharmacy.

## 2022-09-29 NOTE — Telephone Encounter (Signed)
Copied from [place] [id number]. Topic: General - Other >> Sep 29, 2022  4:16 PM Sabas Sous wrote: Reason for CRM: Pt has updated his pharmacy, [facility] [place] is now the only pharmacy on his list

## 2022-09-29 NOTE — Addendum Note (Signed)
Addended by: Karle Plumber B on: 09/29/2022 01:29 PM   Modules accepted: Orders

## 2022-09-29 NOTE — ED Provider Notes (Signed)
[facility] Provider Note   CSN: [id number] Arrival date & time: 09/29/22  0115     History  Chief Complaint  Patient presents with   Palpitations    Victor Thompson is a 61 y.o. male who presents to the ED tonight with concern for to "thumps" that he felt in his left chest this evening.  He states the first woke him from his sleep and felt like a large flick in the left chest internally.  Approximately 10 minutes later he experienced another large "thump/flick" in the left chest prompting his presentation to the ED.  No chest pain prior to or since that time, no shortness of breath or palpitations.  Patient is a hemodialysis patient with fistula right arm, last session yesterday 10/24. Patient states that recently his ICD was deactivated as "Shocking me".  States that he is well controlled on Eliquis and amiodarone at this time.  Per chart review with Dr. Lovena Le, patient's cardiologist from 09/24/2022, patient had incessant V. tach and multiple ICD shocks while in the hospital recently; Patient is admitted 9/10 through 10/6 for V. tach storm and refractory V. tach with end-stage CHF.  He received multiple ICD shocks at a time and his ICD was deactivated and palliative care was consulted.  Patient also had A-fib and a flutter, paroxysmal.  History of sarcoidosis.  LVEF on 08/25/2022 with LVEF of 20 to 25%.  I personally reviewed the patient's medical records previous history of hyperlipidemia, hypertension, dilated cardiomyopathy, history of V. tach with ICD in place, recently deactivated.  History of CHF ESRD on HD.  HPI     Home Medications Prior to Admission medications   Medication Sig Start Date End Date Taking? Authorizing Provider  albuterol (PROVENTIL) (2.5 MG/3ML) 0.083% nebulizer solution TAKE 3 MLS (2.5 MG TOTAL) BY NEBULIZATION EVERY 6 (SIX) HOURS AS NEEDED FOR WHEEZING OR SHORTNESS OF BREATH. 12/30/21 12/30/22  [name], MD   albuterol (VENTOLIN HFA) 108 (90 Base) MCG/ACT inhaler Inhale 2 puffs into the lungs every 4 (four) hours as needed for wheezing or shortness of breath. 10/01/19   [name], MD  amiodarone (PACERONE) 200 MG tablet Take 2 tablets (400 mg total) by mouth 2 (two) times daily. 09/24/22   Milford, Maricela Bo, FNP  apixaban (ELIQUIS) 5 MG TABS tablet Take 1 tablet (5 mg total) by mouth 2 (two) times daily. 09/10/22   Shirley Friar, PA-C  atorvastatin (LIPITOR) 80 MG tablet Take 1 tablet (80 mg total) by mouth at bedtime. 06/25/22 06/25/23  Larey Dresser, MD  AURYXIA 1 GM 210 MG(Fe) tablet Take 1 tablet (210 mg total) by mouth 3 (three) times daily. 08/12/21   [name], MD  calcium acetate (PHOSLO) 667 MG capsule Take 2 capsule by mouth three times a day with meals Patient taking differently: Take 1,334 mg by mouth 3 (three) times daily with meals. 06/09/21     carvedilol (COREG) 12.5 MG tablet Take 1.5 tablets (18.75 mg total) by mouth 2 (two) times daily. 09/24/22 04/22/23  Rafael Bihari, FNP  cetirizine (ZYRTEC) 10 MG tablet Take 1 tablet (10 mg total) by mouth 2 (two) times daily. 09/24/22   Milford, Maricela Bo, FNP  Cholecalciferol (VITAMIN D-3) 125 MCG (5000 UT) TABS Take 5,000 Units by mouth daily.    [provider]  docusate sodium (COLACE) 100 MG capsule Take 1 capsule (100 mg total) by mouth 2 (two) times daily. 02/15/22   [name],  Dalbert Batman, MD  EPINEPHrine 0.3 mg/0.3 mL IJ SOAJ injection Inject 0.3 mg into the muscle once as needed for up to 2 doses (if worsening tongue swelling, SOB, hypoxia, or other concerns for progressive anaphylaxis). 07/15/21   [name], MD  ethyl chloride spray SPRAY A SMALL AMOUNT THREE TIMES A WEEK JUST PRIOR TO NEEDLE INSERTION Patient taking differently: Apply 1 Application topically 3 (three) times a week. 10/26/21   [name], MD  fluticasone (FLONASE) 50 MCG/ACT nasal spray Place 1 spray into both nostrils as  needed for allergies or rhinitis.    [provider]  hydrALAZINE (APRESOLINE) 25 MG tablet Take 75 mg by mouth 3 (three) times daily. 09/06/22   [provider]  hydrOXYzine (ATARAX) 25 MG tablet Take 1-2 tablets (25-50 mg total) by mouth at bedtime as needed for anxiety/insomnia. 09/13/22   [name], MD  isosorbide mononitrate (IMDUR) 60 MG 24 hr tablet Take 60 mg by mouth every morning. 09/06/22   [provider]  mexiletine (MEXITIL) 150 MG capsule Take 2 capsules (300 mg total) by mouth every 12 (twelve) hours. 09/10/22   Shirley Friar, PA-C  pantoprazole (PROTONIX) 40 MG tablet Take 1 tablet (40 mg total) by mouth daily. 09/11/22   Shirley Friar, PA-C  polyethylene glycol (MIRALAX) 17 g packet Take 17 g by mouth daily. Titrate up as needed 05/05/22   Armbruster, Carlota Raspberry, MD  predniSONE (DELTASONE) 10 MG tablet Take 2 tablets (20 mg total) by mouth daily with breakfast. 09/24/22   Evans Lance, MD  Rectal Protectant-Emollient (CALMOL-4) 76-10 % SUPP Use as directed once to twice daily Patient taking differently: Place 1 suppository rectally 2 (two) times daily as needed (constipation). Use as directed once to twice daily 06/18/22   [name], MD  sildenafil (VIAGRA) 50 MG tablet Take 50 mg by mouth daily as needed. 09/06/22   [provider]  sulfamethoxazole-trimethoprim (BACTRIM) 400-80 MG tablet Take 1 tablet by mouth 3 (three) times a week. Will take as long as you are on prednisone 09/13/22 12/12/22  Shirley Friar, PA-C  SYMBICORT 80-4.5 MCG/ACT inhaler INHALE TWO PUFFS INTO THE LUNGS TWICE DAILY (BULK) Patient taking differently: Inhale 2 puffs into the lungs in the morning and at bedtime. 03/10/22   [name], MD  VELPHORO 500 MG chewable tablet Chew 1,000 mg by mouth 3 (three) times daily. 08/06/22   [provider]      Allergies    Ace inhibitors, Influenza vaccines, Ketorolac, Lidocaine,  Lisinopril, and Penicillins    Review of Systems   Review of Systems  Constitutional: Negative.   HENT: Negative.    Respiratory: Negative.    Cardiovascular:  Positive for chest pain.  Gastrointestinal: Negative.   Genitourinary: Negative.   Musculoskeletal: Negative.   Skin: Negative.   Neurological: Negative.    Physical Exam Updated Vital Signs BP (!) 140/87 (BP Location: Right Arm)   Pulse 69   Temp 97.7 F (36.5 C) (Oral)   Resp (!) 25   Ht '5\' 7"'$  (1.702 m)   Wt 114.8 kg   SpO2 96%   BMI 39.63 kg/m  Physical Exam Vitals and nursing note reviewed.  Constitutional:      Appearance: He is not ill-appearing or toxic-appearing.  HENT:     Head: Normocephalic and atraumatic.     Mouth/Throat:     Mouth: Mucous membranes are moist.     Pharynx: No oropharyngeal exudate or  posterior oropharyngeal erythema.  Eyes:     General:        Right eye: No discharge.        Left eye: No discharge.     Extraocular Movements: Extraocular movements intact.     Conjunctiva/sclera: Conjunctivae normal.     Pupils: Pupils are equal, round, and reactive to light.  Cardiovascular:     Rate and Rhythm: Normal rate and regular rhythm.     Pulses: Normal pulses.     Heart sounds: Normal heart sounds. No murmur heard.    Comments: AV fistula in the left distal upper arm with palpable thrill Pulmonary:     Effort: Pulmonary effort is normal. No respiratory distress.     Breath sounds: Normal breath sounds. No wheezing or rales.  Abdominal:     General: Bowel sounds are normal. There is no distension.     Palpations: Abdomen is soft.     Tenderness: There is no abdominal tenderness. There is no guarding or rebound.  Musculoskeletal:        General: No deformity.     Cervical back: Neck supple.     Right lower leg: No edema.     Left lower leg: No edema.  Skin:    General: Skin is warm and dry.     Capillary Refill: Capillary refill takes less than 2 seconds.  Neurological:      General: No focal deficit present.     Mental Status: He is alert and oriented to person, place, and time. Mental status is at baseline.  Psychiatric:        Mood and Affect: Mood normal.     ED Results / Procedures / Treatments   Labs (all labs ordered are listed, but only abnormal results are displayed) Labs Reviewed  CBC WITH DIFFERENTIAL/PLATELET - Abnormal; Notable for the following components:      Result Value   RBC 3.14 (*)    Hemoglobin 10.3 (*)    HCT 31.6 (*)    MCV 100.6 (*)    RDW 17.4 (*)    Lymphs Abs 0.6 (*)    All other components within normal limits  BASIC METABOLIC PANEL - Abnormal; Notable for the following components:   Chloride 97 (*)    Glucose, Bld 107 (*)    BUN 29 (*)    Creatinine, Ser 7.98 (*)    Calcium 7.8 (*)    GFR, Estimated 7 (*)    Anion gap 17 (*)    All other components within normal limits  BRAIN NATRIURETIC PEPTIDE - Abnormal; Notable for the following components:   B Natriuretic Peptide 431.5 (*)    All other components within normal limits  TROPONIN I (HIGH SENSITIVITY) - Abnormal; Notable for the following components:   Troponin I (High Sensitivity) 50 (*)    All other components within normal limits  TROPONIN I (HIGH SENSITIVITY)    EKG None  Radiology DG Chest 2 View  Result Date: 09/29/2022 CLINICAL DATA:  Cardiac symptomatology EXAM: CHEST - 2 VIEW COMPARISON:  09/25/2022 FINDINGS: Cardiac shadow is stable. Anterior defibrillator is noted. Mild vascular congestion is noted. No focal infiltrate is seen. No effusion is noted. Mild degenerative changes of the thoracic spine are noted. IMPRESSION: Mild vascular congestion.  No other focal abnormality is noted. Electronically Signed   By: Inez Catalina M.D.   On: 09/29/2022 02:06    Procedures Procedures    Medications Ordered in ED Medications  acetaminophen (TYLENOL) tablet  1,000 mg (1,000 mg Oral Given 09/29/22 0416)    ED Course/ Medical Decision Making/  A&P Clinical Course as of 09/29/22 0612  Wed Sep 29, 2022  0516 Consult to cardiologist Dr. Humphrey Rolls, who reviewed the patient's medical record and states that clinical picture tonight is inconsistent with cardiac etiology for the reported "thump" patient experienced.  He states that from a cardiology perspective, troponins are flat and EKG is reassuring as well as hemodynamics and patient will be safe to be discharged home with continuation of his home medications.  I appreciate his collaboration in care of this patient. [RS]    Clinical Course User Index [RS] Isidoro Santillana, Gypsy Balsam, PA-C                           Medical Decision Making 61 year old male who presents with concern for thumping sensation in the left chest x2.  Hypertensive on intake of is otherwise normal.  Cardiopulmonary exam is normal, abdominal exam is benign.  Patient is neurovascular intact in all extremities.  DDx includes not limited to metabolic derangement, dysrhythmia, ACS, muscular etiology.  CBC  Amount and/or Complexity of Data Reviewed Labs: ordered.    Details: CBC without leukocytosis and with anemia at patient's baseline 10.3.  BMP with creatinine at patient's baseline of 7.9.  Troponin at patient's baseline was 50, delta troponin 52, flat.  BNP mildly elevated at 431 as expected for CHF patient. Radiology:     Details: Chest x-ray visualized this provider, mild vascular congestion but otherwise unremarkable.   ECG/medicine tests:     Details:   EKG with normal sinus rhythm with some PVCs but no ischemic changes.     Patient remained in his normal sinus rhythm on monitor throughout his stay in the emergency department.  Consults to cardiology as above.  Favor noncardiac etiology for patient's symptoms that woke him from his sleep.  At this time, clinical concern for emergent underlying etiology for patient's symptoms that would warrant further ED work-up or inpatient management is exceedingly low.  The exact  etiology of his symptoms remains unclear does not appear to be cardiac in nature.  At this time no further work-up is warranted in the ER at this time.  Admission was considered though not felt indicated at this time.  Victor Thompson  voiced understanding of his medical evaluation and treatment plan. Each of their questions answered to their expressed satisfaction.  Return precautions were given.  Patient is well-appearing, stable, and was discharged in good condition.  This chart was dictated using voice recognition software, Dragon. Despite the best efforts of this provider to proofread and correct errors, errors may still occur which can change documentation meaning.   Final Clinical Impression(s) / ED Diagnoses Final diagnoses:  None    Rx / DC Orders ED Discharge Orders     None         Aura Dials 09/29/22 5573    Mesner, Corene Cornea, MD 09/29/22 [phone]

## 2022-09-29 NOTE — Telephone Encounter (Signed)
Copied from [place] [phone]. Topic: General - Other >> Sep 28, 2022  8:48 AM Everette C wrote: Reason for CRM: April with [name] has called to share that the patient has recently began taking Milk of Magnesia   The patient began taking it last week  The patient has shared this information with the physical therapist from [facility] that visits them about their stomach discomfort and that they were taking the medication  [name] would like this medication approved by the patient's PCP and added to their med list   Please contact further when possible

## 2022-09-29 NOTE — Telephone Encounter (Signed)
Patient aware of message per Dr. [name].

## 2022-09-29 NOTE — ED Provider Triage Note (Signed)
Emergency Medicine Provider Triage Evaluation Note  Victor Thompson , a 61 y.o. male  was evaluated in triage.  Pt complains of feeling like heart stopped x2 while sleeping.  Has ICD however was delivering inappropriate shocks and was turned off by cardiology, currently on increased doses of amiodarone for VT.  He denies chest pain.  Followed by cards--Dr. Aundra Dubin.  Review of Systems  Positive: Heart symptoms Negative: fever  Physical Exam  BP (!) 155/81 (BP Location: Left Arm)   Pulse 81   Temp 97.8 F (36.6 C) (Oral)   Resp 16   SpO2 97%  Gen:   Awake, no distress   Resp:  Normal effort  MSK:   Moves extremities without difficulty  Other:    Medical Decision Making  Medically screening exam initiated at 1:25 AM.  Appropriate orders placed.  [name] was informed that the remainder of the evaluation will be completed by another provider, this initial triage assessment does not replace that evaluation, and the importance of remaining in the ED until their evaluation is complete.  Feeling heart stopped x2 while sleeping.  Hx of VT-- ICD recently turned off due to inappropriate shocks, now on increased doses of amiodarone.  Denies any current chest pain.  EKG, labs, chest x-ray.   [name], PA-C 09/29/22 [phone]

## 2022-09-29 NOTE — Addendum Note (Signed)
Addended by: Daisy Blossom, Annie Main L on: 09/29/2022 04:56 PM   Modules accepted: Orders

## 2022-09-29 NOTE — Patient Outreach (Signed)
  Care Coordination   Follow Up Visit Note   09/29/2022 Name: BARNIE SOPKO MRN: [id number] DOB: Jan 30, 1961  ENZO TREU is a 61 y.o. year old male who sees [name], MD for primary care. I  reviewed patient's chart in preparation to contact patient for a nurse follow up, noted patient is currently in the ED.   What matters to the patients health and wellness today?  Patient is currently in the [facility] Emergency Department for evaluation of palpitations.     Goals Addressed             This Visit's Progress    Care Coordination Activities: further follow up needed       Care Coordination Interventions: Reviewed chart in preparation to contact patient for RN CC follow up Noted patient is currently in the [facility] Emergency Department for evaluation of palpitations             SDOH assessments and interventions completed:  No     Care Coordination Interventions Activated:  Yes  Care Coordination Interventions:  Yes, provided   Follow up plan: Follow up call scheduled for 09/30/22 '@1'$ :00 PM    Encounter Outcome:  Pt. Visit Completed

## 2022-09-30 ENCOUNTER — Ambulatory Visit: Payer: Self-pay

## 2022-09-30 ENCOUNTER — Other Ambulatory Visit: Payer: Self-pay

## 2022-09-30 DIAGNOSIS — N2581 Secondary hyperparathyroidism of renal origin: Secondary | ICD-10-CM | POA: Diagnosis not present

## 2022-09-30 DIAGNOSIS — R52 Pain, unspecified: Secondary | ICD-10-CM | POA: Diagnosis not present

## 2022-09-30 DIAGNOSIS — N186 End stage renal disease: Secondary | ICD-10-CM | POA: Diagnosis not present

## 2022-09-30 DIAGNOSIS — D509 Iron deficiency anemia, unspecified: Secondary | ICD-10-CM | POA: Diagnosis not present

## 2022-09-30 DIAGNOSIS — Z992 Dependence on renal dialysis: Secondary | ICD-10-CM | POA: Diagnosis not present

## 2022-09-30 DIAGNOSIS — D631 Anemia in chronic kidney disease: Secondary | ICD-10-CM | POA: Diagnosis not present

## 2022-09-30 NOTE — Patient Outreach (Signed)
  Care Coordination   Follow Up Visit Note   09/30/2022 Name: KELON EASOM MRN: [id number] DOB: 07/11/1961  ALAND CHESTNUTT is a 61 y.o. year old male who sees [name], MD for primary care. I spoke with  [name] by phone today.  What matters to the patients health and wellness today?  Patient would like to improve his walking.     Goals Addressed               This Visit's Progress     Patient Stated     I want my breathing and chest soreness to get better (pt-stated)        Care Coordination Interventions: Evaluation of current treatment plan related to paroxsymal ventricular tachycardia and patient's adherence to plan as established by provider Determined patient was evaluated for palpitations at [facility] on 09/29/22, he was discharged home, in home SNV/PT/OT services have resumed Review of patient status, including review of consultant's reports, relevant laboratory and other test results, and medications completed Determined patient feels well today, states he is tired, denies further palpitations and completed his hemodialysis this am  Reviewed scheduled/upcoming provider appointments including: next PCP Dr. [name] scheduled for 10/21/22 '@10'$ :30 AM; Dr. Aundra Dubin Cardiology, 11/05/22 '@10'$ :30 AM        Other     COMPLETED: Care Coordination Activities: further follow up needed        Care Coordination Interventions: Reviewed chart in preparation to contact patient for RN CC follow up Noted patient was discharged home on 09/29/22 after evaluation of his palpitations         SDOH assessments and interventions completed:  Yes     Care Coordination Interventions Activated:  Yes  Care Coordination Interventions:  Yes, provided   Follow up plan: Follow up call scheduled for 11/10/22 '@2'$ :00 PM    Encounter Outcome:  Pt. Visit Completed

## 2022-09-30 NOTE — Patient Instructions (Addendum)
Visit Information  Thank you for taking time to visit with me today. Please don't hesitate to contact me if I can be of assistance to you.   Following are the goals we discussed today:   Goals Addressed               This Visit's Progress     Patient Stated     I want my breathing and chest soreness to get better (pt-stated)        Care Coordination Interventions: Evaluation of current treatment plan related to paroxsymal ventricular tachycardia and patient's adherence to plan as established by provider Determined patient was evaluated for palpitations at [facility] on 09/29/22, he was discharged home, in home SNV/PT/OT services have resumed Review of patient status, including review of consultant's reports, relevant laboratory and other test results, and medications completed Determined patient feels well today, states he is tired, denies further palpitations and completed his hemodialysis this am  Reviewed scheduled/upcoming provider appointments including: next PCP Dr. [name] scheduled for 10/21/22 '@10'$ :30 AM; Dr. Aundra Dubin Cardiology, 11/05/22 '@10'$ :30 AM        Other     COMPLETED: Care Coordination Activities: further follow up needed        Care Coordination Interventions: Reviewed chart in preparation to contact patient for RN CC follow up Noted patient was discharged home on 09/29/22 after evaluation of his palpitations         Our next appointment is by telephone on 11/10/22 at 2:00 PM  Please call the care guide team at [phone] if you need to cancel or reschedule your appointment.   If you are experiencing a Mental Health or [place] or need someone to talk to, please call 1-800-273-TALK (toll free, 24 hour hotline) go to [facility] [address] [place] [phone])  Patient verbalizes understanding of instructions and care plan provided today and agrees to view in [place]. Active MyChart status and  patient understanding of how to access instructions and care plan via MyChart confirmed with patient.     Barb Merino, RN, BSN, CCM Care Management Coordinator [facility] Care Management Direct Phone: [phone]

## 2022-10-01 ENCOUNTER — Other Ambulatory Visit: Payer: Self-pay

## 2022-10-02 DIAGNOSIS — N186 End stage renal disease: Secondary | ICD-10-CM | POA: Diagnosis not present

## 2022-10-02 DIAGNOSIS — D631 Anemia in chronic kidney disease: Secondary | ICD-10-CM | POA: Diagnosis not present

## 2022-10-02 DIAGNOSIS — Z992 Dependence on renal dialysis: Secondary | ICD-10-CM | POA: Diagnosis not present

## 2022-10-02 DIAGNOSIS — R52 Pain, unspecified: Secondary | ICD-10-CM | POA: Diagnosis not present

## 2022-10-02 DIAGNOSIS — N2581 Secondary hyperparathyroidism of renal origin: Secondary | ICD-10-CM | POA: Diagnosis not present

## 2022-10-02 DIAGNOSIS — D509 Iron deficiency anemia, unspecified: Secondary | ICD-10-CM | POA: Diagnosis not present

## 2022-10-04 ENCOUNTER — Other Ambulatory Visit: Payer: Self-pay | Admitting: *Deleted

## 2022-10-04 ENCOUNTER — Other Ambulatory Visit: Payer: Self-pay

## 2022-10-04 ENCOUNTER — Ambulatory Visit: Payer: Self-pay | Admitting: *Deleted

## 2022-10-04 ENCOUNTER — Telehealth: Payer: Self-pay | Admitting: Internal Medicine

## 2022-10-04 DIAGNOSIS — I472 Ventricular tachycardia, unspecified: Secondary | ICD-10-CM | POA: Diagnosis not present

## 2022-10-04 DIAGNOSIS — G4733 Obstructive sleep apnea (adult) (pediatric): Secondary | ICD-10-CM | POA: Diagnosis not present

## 2022-10-04 DIAGNOSIS — M7521 Bicipital tendinitis, right shoulder: Secondary | ICD-10-CM | POA: Diagnosis not present

## 2022-10-04 DIAGNOSIS — E785 Hyperlipidemia, unspecified: Secondary | ICD-10-CM | POA: Diagnosis not present

## 2022-10-04 DIAGNOSIS — I5084 End stage heart failure: Secondary | ICD-10-CM | POA: Diagnosis not present

## 2022-10-04 DIAGNOSIS — Z7952 Long term (current) use of systemic steroids: Secondary | ICD-10-CM | POA: Diagnosis not present

## 2022-10-04 DIAGNOSIS — Z7901 Long term (current) use of anticoagulants: Secondary | ICD-10-CM | POA: Diagnosis not present

## 2022-10-04 DIAGNOSIS — N186 End stage renal disease: Secondary | ICD-10-CM | POA: Diagnosis not present

## 2022-10-04 DIAGNOSIS — I5023 Acute on chronic systolic (congestive) heart failure: Secondary | ICD-10-CM | POA: Diagnosis not present

## 2022-10-04 DIAGNOSIS — I2510 Atherosclerotic heart disease of native coronary artery without angina pectoris: Secondary | ICD-10-CM | POA: Diagnosis not present

## 2022-10-04 DIAGNOSIS — Z9981 Dependence on supplemental oxygen: Secondary | ICD-10-CM | POA: Diagnosis not present

## 2022-10-04 DIAGNOSIS — I480 Paroxysmal atrial fibrillation: Secondary | ICD-10-CM | POA: Diagnosis not present

## 2022-10-04 DIAGNOSIS — Z7951 Long term (current) use of inhaled steroids: Secondary | ICD-10-CM | POA: Diagnosis not present

## 2022-10-04 DIAGNOSIS — T82198D Other mechanical complication of other cardiac electronic device, subsequent encounter: Secondary | ICD-10-CM | POA: Diagnosis not present

## 2022-10-04 DIAGNOSIS — I051 Rheumatic mitral insufficiency: Secondary | ICD-10-CM | POA: Diagnosis not present

## 2022-10-04 DIAGNOSIS — I4892 Unspecified atrial flutter: Secondary | ICD-10-CM | POA: Diagnosis not present

## 2022-10-04 DIAGNOSIS — I272 Pulmonary hypertension, unspecified: Secondary | ICD-10-CM | POA: Diagnosis not present

## 2022-10-04 DIAGNOSIS — I951 Orthostatic hypotension: Secondary | ICD-10-CM | POA: Diagnosis not present

## 2022-10-04 DIAGNOSIS — F418 Other specified anxiety disorders: Secondary | ICD-10-CM

## 2022-10-04 DIAGNOSIS — I132 Hypertensive heart and chronic kidney disease with heart failure and with stage 5 chronic kidney disease, or end stage renal disease: Secondary | ICD-10-CM | POA: Diagnosis not present

## 2022-10-04 DIAGNOSIS — Z992 Dependence on renal dialysis: Secondary | ICD-10-CM | POA: Diagnosis not present

## 2022-10-04 DIAGNOSIS — J9601 Acute respiratory failure with hypoxia: Secondary | ICD-10-CM | POA: Diagnosis not present

## 2022-10-04 DIAGNOSIS — I255 Ischemic cardiomyopathy: Secondary | ICD-10-CM | POA: Diagnosis not present

## 2022-10-04 DIAGNOSIS — Z792 Long term (current) use of antibiotics: Secondary | ICD-10-CM | POA: Diagnosis not present

## 2022-10-04 NOTE — Telephone Encounter (Signed)
Requested medication (s) are due for refill today: historical provider  Requested medication (s) are on the active medication list: yes  Last refill:  prednisone-09/24/22 #60 3 refills, hydralazine-09/24/22  Future visit scheduled: yes in 2 weeks  Notes to clinic:  historical provider, hydralazine and last ordered by Cristopher Peru, MD for prednisone. Patient also requesting renal - vite. No medication listed on med profile. Do you want to refill? Patient reports he is almost out of medications. See previous NT encounter from today .     Requested Prescriptions  Pending Prescriptions Disp Refills   predniSONE (DELTASONE) 10 MG tablet 60 tablet 3    Sig: Take 2 tablets (20 mg total) by mouth daily with breakfast.     Not Delegated - Endocrinology:  Oral Corticosteroids Failed - 10/04/2022  5:16 PM      Failed - This refill cannot be delegated      Failed - Manual Review: Eye exam for IOP if prolonged treatment      Failed - Glucose (serum) in normal range and within 180 days    Glucose, Bld  Date Value Ref Range Status  09/29/2022 107 (H) 70 - 99 mg/dL Final    Comment:    Glucose reference range applies only to samples taken after fasting for at least 8 hours.   Glucose-Capillary  Date Value Ref Range Status  09/10/2022 102 (H) 70 - 99 mg/dL Final    Comment:    Glucose reference range applies only to samples taken after fasting for at least 8 hours.         Failed - Bone Mineral Density or Dexa Scan completed in the last 2 years      58 - K in normal range and within 180 days    Potassium  Date Value Ref Range Status  09/29/2022 3.8 3.5 - 5.1 mmol/L Final         Passed - Na in normal range and within 180 days    Sodium  Date Value Ref Range Status  09/29/2022 143 135 - 145 mmol/L Final  09/08/2021 142 134 - 144 mmol/L Final         Passed - Last BP in normal range    BP Readings from Last 1 Encounters:  09/29/22 123/68         Passed - Valid encounter  within last 6 months    Recent Outpatient Visits           2 weeks ago Hospital discharge follow-up   [place], MD   1 month ago Anxiety about health   Primary Care at [facility], Cari S, PA-C   3 months ago Essential hypertension   [place], MD   6 months ago Other proteinuria   [place], MD   7 months ago Encounter for [employer] annual wellness exam   [place], MD       Future Appointments             In 3 days Deneise Lever, MD [facility]   In 2 weeks [name], MD [place]             hydrALAZINE (APRESOLINE) 25 MG tablet      Sig: Take 3 tablets (75 mg total) by mouth 3 (  three) times daily.     Cardiovascular:  Vasodilators Failed - 10/04/2022  5:16 PM      Failed - HCT in normal range and within 360 days    HCT  Date Value Ref Range Status  09/29/2022 31.6 (L) 39.0 - 52.0 % Final   Hematocrit  Date Value Ref Range Status  09/08/2021 35.4 (L) 37.5 - 51.0 % Final         Failed - HGB in normal range and within 360 days    Hemoglobin  Date Value Ref Range Status  09/29/2022 10.3 (L) 13.0 - 17.0 g/dL Final  09/08/2021 12.0 (L) 13.0 - 17.7 g/dL Final   Total hemoglobin  Date Value Ref Range Status  09/02/2022 9.7 (L) 12.0 - 16.0 g/dL Final         Failed - RBC in normal range and within 360 days    RBC  Date Value Ref Range Status  09/29/2022 3.14 (L) 4.22 - 5.81 MIL/uL Final         Failed - ANA Screen, Ifa, Serum in normal range and within 360 days    ANA Titer 1  Date Value Ref Range Status  09/08/2021 Negative  Final    Comment:                                         Negative   <1:80                                      Borderline  1:80                                       Positive   >1:80 ICAP nomenclature: AC-0 For more information about Hep-2 cell patterns use ANApatterns.org, the official website for the International Consensus on Antinuclear Antibody (ANA) Patterns (ICAP).          Passed - WBC in normal range and within 360 days    WBC  Date Value Ref Range Status  09/29/2022 5.5 4.0 - 10.5 K/uL Final         Passed - PLT in normal range and within 360 days    Platelets  Date Value Ref Range Status  09/29/2022 167 150 - 400 K/uL Final  10/10/2020 179 150 - 450 x10E3/uL Final         Passed - Last BP in normal range    BP Readings from Last 1 Encounters:  09/29/22 123/68         Passed - Valid encounter within last 12 months    Recent Outpatient Visits           2 weeks ago Hospital discharge follow-up   [place], MD   1 month ago Anxiety about health   Primary Care at [facility], Cari S, PA-C   3 months ago Essential hypertension   [place], MD   6 months ago Other proteinuria   [place], MD   7 months ago Encounter for [employer] annual wellness exam   [place],  MD       Future Appointments             In 3 days Young, Kasandra Knudsen, MD [facility]   In 2 weeks [name], MD [place]

## 2022-10-04 NOTE — Telephone Encounter (Signed)
Clair Gulling is discharging pt from home health PT / he is passing homebound   Pt was rushing prior to visit wit PT and his BP was elevated at 200/100 after 20 minutes it ws 160/90 / he had no other symptoms thru out the time /   Earlier today he went to ortho appt for right shoulder pain and he received a script for therapy / if he has problems in the future out patient PT would be good for him /   In the last few days the pt was working out side / he tripped with no injury and was able to get up alone

## 2022-10-04 NOTE — Telephone Encounter (Signed)
Called received from Victor Thompson, PT with [facility] [phone] to report elevated BP 160 /90 asymptomatic. NT Called patient for triage. Chief Complaint: requesting medication refills , denies symptoms of elevated BP Symptoms: during PT session BP 200/100 per PT after patient just took shower and doing activities. After patient rested BP 160/90. Asymptomatic. Fall reported by PT. Patient reports he was walking backwards and fell on  buttocks and was not hurt yesterday  Frequency: today  Pertinent Negatives: Patient denies chest pain no difficulty breathing denies neurological sx Disposition: '[]'$ ED /'[]'$ Urgent Care (no appt availability in office) / '[x]'$ Appointment(In office/virtual)/ '[]'$  [facility]/ '[]'$ Home Care/ '[]'$ Refused Recommended Disposition /'[]'$ [facility]/ '[]'$  Follow-up with PCP Additional Notes:   Appt already scheduled for 10/21/22. No earlier appt available. Patient requesting refill on medications and to clarify is he is supposed to be taking hydralazine. Reviewed with patient hydralazine is listed and patient reports "better check with my heart dr". Recommended patient call cardiologist and renal dr for clarification of medications. Patient reports he needs refills for prednisone and needs to clarify dose , he is taking 3 tablets 30 mg  with breakfast and only has 3 tablets left. On medication list is to take  20 mg daily with breakfast. Please advise . Patient needs education on all medications , dose and what medications to take. Patient would like a call back .   Reason for Disposition  [4] Systolic BP  >= 982 OR Diastolic >= 80 AND [6] taking BP medications  Answer Assessment - Initial Assessment Questions 1. BLOOD PRESSURE: "What is the blood pressure?" "Did you take at least two measurements 5 minutes apart?"     PT Jim took patient's BP 160/90 after 20 minutes of rest  2. ONSET: "When did you take your blood pressure?"     Took BP after patient rested x 20  minutes of physical activity 3. HOW: "How did you take your blood pressure?" (e.g., automatic home BP monitor, visiting nurse)     PT took BP , reports patient's home monitor not accurate and cuff size is too small 4. HISTORY: "Do you have a history of high blood pressure?"     yes 5. MEDICINES: "Are you taking any medicines for blood pressure?" "Have you missed any doses recently?"     Reports patient was asking questions regarding medications  6. OTHER SYMPTOMS: "Do you have any symptoms?" (e.g., blurred vision, chest pain, difficulty breathing, headache, weakness)     Denies patient had symptoms called patient and patient denies sx 7. PREGNANCY: "Is there any chance you are pregnant?" "When was your last menstrual period?"     na  Protocols used: Blood Pressure - High-A-AH

## 2022-10-05 DIAGNOSIS — N2581 Secondary hyperparathyroidism of renal origin: Secondary | ICD-10-CM | POA: Diagnosis not present

## 2022-10-05 DIAGNOSIS — D509 Iron deficiency anemia, unspecified: Secondary | ICD-10-CM | POA: Diagnosis not present

## 2022-10-05 DIAGNOSIS — R52 Pain, unspecified: Secondary | ICD-10-CM | POA: Diagnosis not present

## 2022-10-05 DIAGNOSIS — I129 Hypertensive chronic kidney disease with stage 1 through stage 4 chronic kidney disease, or unspecified chronic kidney disease: Secondary | ICD-10-CM | POA: Diagnosis not present

## 2022-10-05 DIAGNOSIS — Z992 Dependence on renal dialysis: Secondary | ICD-10-CM | POA: Diagnosis not present

## 2022-10-05 DIAGNOSIS — N186 End stage renal disease: Secondary | ICD-10-CM | POA: Diagnosis not present

## 2022-10-05 DIAGNOSIS — D631 Anemia in chronic kidney disease: Secondary | ICD-10-CM | POA: Diagnosis not present

## 2022-10-05 NOTE — Progress Notes (Unsigned)
HPI male former smoker followed for Angioedema, OSA, complicated by CKD, HBP Unattended Home Sleep Test 05/18/2016-AHI 60.7/hour, desaturation to 79%, body weight 289.5 pounds  ===============================================================   12/03/21- 6yoM former smoker coming to re-establish for OSA, complicated by hx Angioedema, CKD,/ ESRD/Dialysis,  HBP, CAD/M/ AICD, CHF, CM, Renal Hyperparathyroidism, Morbid Obesity,  Epworth score -2 CPAP- 5-20/ [employer]         Luna machine auto Download- compliance  85.7%  , AHI 2.5/ hr Body weight today- 269 lbs Covid vax-2 Phizer Flu vax- none Patient doing good, no concerns He actually admits to me that he "fights" with his CPAP mask.  Fullface mask.  He salivates and lies with mouth open.  We discussed chinstrap and we can also try mask fitting and desensitization to see if we can help him get more comfortable.  Download reviewed with him.  10/07/22- 61 yoM former smoker followed for OSA, complicated by hx Angioedema, CKD,/ ESRD/Dialysis,  HBP, CAD/M/ AICD/ VTach, CHF, CM, Renal Hyperparathyroidism, Morbid Obesity, Sarcoid, Epworth score -2 CPAP- 5-20/ [employer]         Luna machine auto Download- compliance   Body weight today- Covid vax-2 Phizer Flu vax- none ED 10/25> palpitations  CXR 09/29/22-  IMPRESSION: Mild vascular congestion.  No other focal abnormality is noted.  ROS-see HPI  + = positive Constitutional:    weight loss, night sweats, fevers, chills, fatigue, lassitude. HEENT:    headaches, difficulty swallowing, tooth/dental problems, sore throat,       sneezing, itching, ear ache, nasal congestion, post nasal drip, snoring CV:    chest pain, orthopnea, PND, swelling in lower extremities, anasarca,                                                        dizziness, palpitations Resp:   shortness of breath with exertion or at rest.                productive cough,   non-productive cough, coughing up of blood.              change in  color of mucus.  wheezing.   Skin:   Clear GI:  No-   heartburn, indigestion, abdominal pain, nausea, vomiting, diarrhea,                 change in bowel habits, loss of appetite GU: dysuria, change in color of urine, no urgency or frequency.   flank pain. MS:   joint pain, stiffness, decreased range of motion, back pain. Neuro-     nothing unusual Psych:  change in mood or affect.  depression or anxiety.   memory loss.  OBJ- Physical Exam General- Alert, Oriented, Affect-appropriate, Distress- none acute, + obese Skin-clear Lymphadenopathy- none Head- atraumatic            Eyes- Gross vision intact, PERRLA, conjunctivae and secretions clear            Ears- Hearing, canals-normal            Nose- Clear, no-Septal dev, mucus, polyps, erosion, perforation             Throat- Mallampati IV , mucosa clear , drainage- none, tonsils- atrophic Neck- flexible , trachea midline, no stridor , thyroid nl, carotid no bruit Chest - symmetrical excursion , unlabored  Heart/CV- RRR , no murmur , no gallop  , no rub, nl s1 s2                           - JVD- none , edema- none, stasis changes- none, varices- none           Lung- clear to P&A, wheeze- none, cough- none , dullness-none, rub- none           Chest wall-  Abd-  Br/ Gen/ Rectal- Not done, not indicated Extrem- cyanosis- none, clubbing, none, atrophy- none, strength- nl Neuro- grossly intact to observation,

## 2022-10-05 NOTE — Telephone Encounter (Signed)
Routing to PCP for review.

## 2022-10-05 NOTE — Telephone Encounter (Signed)
Spoke with patient regarding his BP. He endorses that his SBP this morning was 113 mmHg (unable to tell me what his DBP was). He also states that he had hemodialysis this morning and was able to tolerate a full session without issues.   When asked about which medications he is taking for his blood pressure, he states he cannot remember. He endorses feeling overall well and attributes his elevated BP during his PT session to being rushed.   Joseph Art, Pharm.D. PGY-2 Ambulatory Care Pharmacy Resident 10/05/2022 3:12 PM

## 2022-10-06 DIAGNOSIS — I272 Pulmonary hypertension, unspecified: Secondary | ICD-10-CM | POA: Diagnosis not present

## 2022-10-06 DIAGNOSIS — E785 Hyperlipidemia, unspecified: Secondary | ICD-10-CM | POA: Diagnosis not present

## 2022-10-06 DIAGNOSIS — I5084 End stage heart failure: Secondary | ICD-10-CM | POA: Diagnosis not present

## 2022-10-06 DIAGNOSIS — G4733 Obstructive sleep apnea (adult) (pediatric): Secondary | ICD-10-CM | POA: Diagnosis not present

## 2022-10-06 DIAGNOSIS — I255 Ischemic cardiomyopathy: Secondary | ICD-10-CM | POA: Diagnosis not present

## 2022-10-06 DIAGNOSIS — J9601 Acute respiratory failure with hypoxia: Secondary | ICD-10-CM | POA: Diagnosis not present

## 2022-10-06 DIAGNOSIS — I5023 Acute on chronic systolic (congestive) heart failure: Secondary | ICD-10-CM | POA: Diagnosis not present

## 2022-10-06 DIAGNOSIS — Z7952 Long term (current) use of systemic steroids: Secondary | ICD-10-CM | POA: Diagnosis not present

## 2022-10-06 DIAGNOSIS — I2510 Atherosclerotic heart disease of native coronary artery without angina pectoris: Secondary | ICD-10-CM | POA: Diagnosis not present

## 2022-10-06 DIAGNOSIS — Z792 Long term (current) use of antibiotics: Secondary | ICD-10-CM | POA: Diagnosis not present

## 2022-10-06 DIAGNOSIS — Z992 Dependence on renal dialysis: Secondary | ICD-10-CM | POA: Diagnosis not present

## 2022-10-06 DIAGNOSIS — I051 Rheumatic mitral insufficiency: Secondary | ICD-10-CM | POA: Diagnosis not present

## 2022-10-06 DIAGNOSIS — Z7951 Long term (current) use of inhaled steroids: Secondary | ICD-10-CM | POA: Diagnosis not present

## 2022-10-06 DIAGNOSIS — I472 Ventricular tachycardia, unspecified: Secondary | ICD-10-CM | POA: Diagnosis not present

## 2022-10-06 DIAGNOSIS — I4892 Unspecified atrial flutter: Secondary | ICD-10-CM | POA: Diagnosis not present

## 2022-10-06 DIAGNOSIS — I480 Paroxysmal atrial fibrillation: Secondary | ICD-10-CM | POA: Diagnosis not present

## 2022-10-06 DIAGNOSIS — N186 End stage renal disease: Secondary | ICD-10-CM | POA: Diagnosis not present

## 2022-10-06 DIAGNOSIS — T82198D Other mechanical complication of other cardiac electronic device, subsequent encounter: Secondary | ICD-10-CM | POA: Diagnosis not present

## 2022-10-06 DIAGNOSIS — I132 Hypertensive heart and chronic kidney disease with heart failure and with stage 5 chronic kidney disease, or end stage renal disease: Secondary | ICD-10-CM | POA: Diagnosis not present

## 2022-10-06 DIAGNOSIS — I951 Orthostatic hypotension: Secondary | ICD-10-CM | POA: Diagnosis not present

## 2022-10-06 DIAGNOSIS — Z7901 Long term (current) use of anticoagulants: Secondary | ICD-10-CM | POA: Diagnosis not present

## 2022-10-06 DIAGNOSIS — Z9981 Dependence on supplemental oxygen: Secondary | ICD-10-CM | POA: Diagnosis not present

## 2022-10-06 NOTE — Telephone Encounter (Signed)
Call placed to patient and VM was left informing patient to return phone call regarding his BP medications.

## 2022-10-07 ENCOUNTER — Encounter: Payer: Self-pay | Admitting: Internal Medicine

## 2022-10-07 ENCOUNTER — Ambulatory Visit (INDEPENDENT_AMBULATORY_CARE_PROVIDER_SITE_OTHER): Payer: Medicare Other | Admitting: Internal Medicine

## 2022-10-07 ENCOUNTER — Telehealth: Payer: Self-pay

## 2022-10-07 VITALS — BP 118/78 | HR 73 | Ht 67.0 in | Wt 252.8 lb

## 2022-10-07 DIAGNOSIS — D631 Anemia in chronic kidney disease: Secondary | ICD-10-CM | POA: Diagnosis not present

## 2022-10-07 DIAGNOSIS — G4733 Obstructive sleep apnea (adult) (pediatric): Secondary | ICD-10-CM | POA: Diagnosis not present

## 2022-10-07 DIAGNOSIS — J4540 Moderate persistent asthma, uncomplicated: Secondary | ICD-10-CM

## 2022-10-07 DIAGNOSIS — L299 Pruritus, unspecified: Secondary | ICD-10-CM | POA: Diagnosis not present

## 2022-10-07 DIAGNOSIS — N186 End stage renal disease: Secondary | ICD-10-CM | POA: Diagnosis not present

## 2022-10-07 DIAGNOSIS — Z992 Dependence on renal dialysis: Secondary | ICD-10-CM | POA: Diagnosis not present

## 2022-10-07 DIAGNOSIS — N2581 Secondary hyperparathyroidism of renal origin: Secondary | ICD-10-CM | POA: Diagnosis not present

## 2022-10-07 DIAGNOSIS — D509 Iron deficiency anemia, unspecified: Secondary | ICD-10-CM | POA: Diagnosis not present

## 2022-10-07 NOTE — Telephone Encounter (Signed)
     Patient  visit on 10/25  at [facility]  Have you been able to follow up with your primary care physician? YES  The patient was or was not able to obtain any needed medicine or equipment. YES  Are there diet recommendations that you are having difficulty following? NA  Patient expresses understanding of discharge instructions and education provided has no other needs at this time.  Etna Green, [facility], Care Management  [phone] [address], [place], [place] [address] Phone: [phone] Email: [email or web address]

## 2022-10-07 NOTE — Patient Instructions (Signed)
Order- DME [facility]- please replace old CPAP machine- prefer AirSense, auto 5-20, mask of choice, humidifier, supplies. Please install AirView/ card  Please call if we can help

## 2022-10-07 NOTE — Assessment & Plan Note (Signed)
He has benefited from CPAP with good compliance and needs to continue it with his cardiac history. Plan-replace old CPAP machine, hopefully changing his Luna machine to [employer] 5-20

## 2022-10-07 NOTE — Assessment & Plan Note (Signed)
Uncomplicated, well controlled with current meds.

## 2022-10-08 ENCOUNTER — Ambulatory Visit: Payer: Self-pay

## 2022-10-08 ENCOUNTER — Other Ambulatory Visit: Payer: Self-pay

## 2022-10-08 NOTE — Telephone Encounter (Signed)
  Chief Complaint: Prednisone dosage.  Symptoms: none Frequency:  Pertinent Negatives: Patient denies  Disposition: '[]'$ ED /'[]'$ Urgent Care (no appt availability in office) / '[]'$ Appointment(In office/virtual)/ '[]'$  [facility]/ '[x]'$ Home Care/ '[]'$ Refused Recommended Disposition /'[]'$ [facility]/ '[]'$  Follow-up with PCP Additional Notes: Pt was unsure of dosage for prednisone prescribed by Dr. Lovena Le. Rx below:  'Rx #: [id number] predniSONE (DELTASONE) 10 MG tablet [[id number]]  Order Details Dose: 20 mg Route: Oral Frequency: Daily with breakfast  Dispense Quantity: 60 tablet Refills: 3 Fills remaining: 3       Sig: Take 2 tablets (20 mg total) by mouth daily with breakfast.       Start Date: 09/24/22 End Date: --  Written Date: 09/24/22 Expiration Date: 09/24/23  Original Order: predniSONE (DELTASONE) 10 MG tablet [[id number]]  Providers   Ordering and Authorizing Provider: Evans Lance, MD [address], [place] [address] Phone: [phone]   Fax: [phone] DEA #: [id number]   NPI: [id number]    Office visit notes:10/20  Possible sarcoid CM - he has been on high dose prednisone without a clear diagnosis. He is a bit agitated and having trouble sleeping and I plan to reduce the dose of prednisone to 20 mg daily.   Med instructions from OV 09/24/2022:    Medication Instructions:  Your physician has recommended you make the following change in your medication:  TAKE Amiodarone as follows -- take 2 tablets (400 mg total) TWICE daily TAKE Prednisone as follows -- take 2 tablets (20 mg total) ONCE daily         Pt has medication questions regarding his predniSONE (DELTASONE) 10 MG tablet, needs to clarify dosage instructions Best contact: [phone]     Reason for Disposition  Caller has medicine question only, adult not sick, AND triager answers question  Answer Assessment - Initial Assessment Questions 1. NAME of MEDICINE: "What  medicine(s) are you calling about?"     Prednisone. 2. QUESTION: "What is your question?" (e.g., double dose of medicine, side effect)     How much and how often should pt be taking this medication prescribed by Dr. Lovena Le.  3. PRESCRIBER: "Who prescribed the medicine?" Reason: if prescribed by specialist, call should be referred to that group.     Dr. Lovena Le. 4. SYMPTOMS: "Do you have any symptoms?" If Yes, ask: "What symptoms are you having?"  "How bad are the symptoms (e.g., mild, moderate, severe)      5. PREGNANCY:  "Is there any chance that you are pregnant?" "When was your last menstrual period?"  Protocols used: Medication Question Call-A-AH

## 2022-10-09 DIAGNOSIS — N186 End stage renal disease: Secondary | ICD-10-CM | POA: Diagnosis not present

## 2022-10-09 DIAGNOSIS — D631 Anemia in chronic kidney disease: Secondary | ICD-10-CM | POA: Diagnosis not present

## 2022-10-09 DIAGNOSIS — N2581 Secondary hyperparathyroidism of renal origin: Secondary | ICD-10-CM | POA: Diagnosis not present

## 2022-10-09 DIAGNOSIS — Z992 Dependence on renal dialysis: Secondary | ICD-10-CM | POA: Diagnosis not present

## 2022-10-09 DIAGNOSIS — L299 Pruritus, unspecified: Secondary | ICD-10-CM | POA: Diagnosis not present

## 2022-10-09 DIAGNOSIS — D509 Iron deficiency anemia, unspecified: Secondary | ICD-10-CM | POA: Diagnosis not present

## 2022-10-11 ENCOUNTER — Other Ambulatory Visit: Payer: Self-pay | Admitting: Internal Medicine

## 2022-10-11 ENCOUNTER — Other Ambulatory Visit: Payer: Self-pay | Admitting: Student

## 2022-10-11 DIAGNOSIS — K5903 Drug induced constipation: Secondary | ICD-10-CM

## 2022-10-12 ENCOUNTER — Other Ambulatory Visit (HOSPITAL_COMMUNITY): Payer: Self-pay

## 2022-10-12 DIAGNOSIS — Z992 Dependence on renal dialysis: Secondary | ICD-10-CM | POA: Diagnosis not present

## 2022-10-12 DIAGNOSIS — N186 End stage renal disease: Secondary | ICD-10-CM | POA: Diagnosis not present

## 2022-10-12 DIAGNOSIS — D631 Anemia in chronic kidney disease: Secondary | ICD-10-CM | POA: Diagnosis not present

## 2022-10-12 DIAGNOSIS — L299 Pruritus, unspecified: Secondary | ICD-10-CM | POA: Diagnosis not present

## 2022-10-12 DIAGNOSIS — D509 Iron deficiency anemia, unspecified: Secondary | ICD-10-CM | POA: Diagnosis not present

## 2022-10-12 DIAGNOSIS — E785 Hyperlipidemia, unspecified: Secondary | ICD-10-CM

## 2022-10-12 DIAGNOSIS — N2581 Secondary hyperparathyroidism of renal origin: Secondary | ICD-10-CM | POA: Diagnosis not present

## 2022-10-12 DIAGNOSIS — I2510 Atherosclerotic heart disease of native coronary artery without angina pectoris: Secondary | ICD-10-CM

## 2022-10-12 MED ORDER — ATORVASTATIN CALCIUM 80 MG PO TABS: 80.0000 mg | ORAL_TABLET | Freq: Every day | ORAL | 3 refills | Status: DC

## 2022-10-12 NOTE — Telephone Encounter (Signed)
Requested medication (s) are due for refill today: some medication needs review for refills  Requested medication (s) are on the active medication list: yes  Last refill:  multiple dates  Future visit scheduled:no  Notes to clinic:  Unable to refill per protocol, last refill by another provider.  Some are historical medications, routing for review.     Requested Prescriptions  Pending Prescriptions Disp Refills   docusate sodium (COLACE) 100 MG capsule [Pharmacy Med Name: docusate sodium 100 mg capsule] 90 capsule 3    Sig: NEW PRESCRIPTION REQUEST: Docusate Sodium 100 Mg- TAKE ONE CAPSULE BY MOUTH DAILY     Over the Counter:  OTC Passed - 10/11/2022 11:19 AM      Passed - Valid encounter within last 12 months    Recent Outpatient Visits           3 weeks ago Hospital discharge follow-up   [place], MD   1 month ago Anxiety about health   Primary Care at [facility], Cari S, PA-C   3 months ago Essential hypertension   [place], MD   6 months ago Other proteinuria   [place], MD   7 months ago Encounter for [employer] annual wellness exam   [place], MD       Future Appointments             In 1 week [name], MD [place]             albuterol (PROVENTIL) (2.5 MG/3ML) 0.083% nebulizer solution [Pharmacy Med Name: albuterol sulfate 2.5 mg/3 mL (0.083 %) solution for nebulization] 675 mL 3    Sig: NEW PRESCRIPTION REQUEST: Albuterol Sul 2.5 Mg/3 Ml- INHALE THE CONTENTS OF 1 VIAL BY MOUTH EVERY 6 HOURS AS NEEDED     Pulmonology:  Beta Agonists 2 Passed - 10/11/2022 11:19 AM      Passed - Last BP in normal range    BP Readings from Last 1 Encounters:  10/07/22 118/78         Passed - Last Heart Rate in normal  range    Pulse Readings from Last 1 Encounters:  10/07/22 73         Passed - Valid encounter within last 12 months    Recent Outpatient Visits           3 weeks ago Hospital discharge follow-up   [place], MD   1 month ago Anxiety about health   Primary Care at [facility], Cari S, PA-C   3 months ago Essential hypertension   [place], MD   6 months ago Other proteinuria   [place], MD   7 months ago Encounter for [employer] annual wellness exam   [place], MD       Future Appointments             In 1 week [name], MD [place]             fluticasone [facility]) 50 MCG/ACT nasal spray [Pharmacy Med Name: fluticasone propionate 50 mcg/actuation nasal spray,suspension] 47.4 g 3  Sig: NEW PRESCRIPTION REQUEST: Fluticasone Prop 50 Mcg- INSTILL 1 SPRAY IN EACH NOSTRIL DAILY AS NEEDED     Ear, Nose, and Throat: Nasal Preparations - Corticosteroids Passed - 10/11/2022 11:19 AM      Passed - Valid encounter within last 12 months    Recent Outpatient Visits           3 weeks ago Hospital discharge follow-up   [place], MD   1 month ago Anxiety about health   Primary Care at [facility], Cari S, PA-C   3 months ago Essential hypertension   [place], MD   6 months ago Other proteinuria   [place], MD   7 months ago Encounter for [employer] annual wellness exam   [place], MD       Future Appointments             In 1 week [name], MD [place]              Polyethylene Glycol 3350 (PEG 3350) 17 g PACK [Pharmacy Med Name: polyethylene glycol 3350 17 gram oral powder packet] 90 packet 3    Sig: NEW PRESCRIPTION REQUEST: Polyethylene Glycol 3350- 1 PACKET BY MOUTH DAILY AS NEEDED     Gastroenterology:  Laxatives Passed - 10/11/2022 11:19 AM      Passed - Valid encounter within last 12 months    Recent Outpatient Visits           3 weeks ago Hospital discharge follow-up   [place], MD   1 month ago Anxiety about health   Primary Care at [facility], Cari S, PA-C   3 months ago Essential hypertension   [place], MD   6 months ago Other proteinuria   [place], MD   7 months ago Encounter for [employer] annual wellness exam   [place], MD       Future Appointments             In 1 week [name], MD [place] 80-4.5 MCG/ACT inhaler [Pharmacy Med Name: Symbicort 80 mcg-4.5 mcg/actuation HFA aerosol inhaler] 30.6 g 3    Sig: NEW PRESCRIPTION REQUEST: Symbicort 80-4.5 Mcg- INHALE TWO PUFFS BY MOUTH TWICE DAILY     Pulmonology:  Combination Products Passed - 10/11/2022 11:19 AM      Passed - Valid encounter within last 12 months    Recent Outpatient Visits           3 weeks ago Hospital discharge follow-up   [place], MD   1 month ago Anxiety about health   Primary Care at [facility], [place], PA-C   3 months ago Essential hypertension   [place], MD   6 months ago Other proteinuria   [place], MD   7 months ago Encounter for [employer] annual wellness exam   [facility] [name], MD       Future Appointments             In 1 week [name], MD [place]             albuterol (VENTOLIN HFA) 108 [phone] Base) MCG/ACT inhaler [Pharmacy Med Name: albuterol sulfate HFA 90 mcg/actuation aerosol inhaler] 60.3 g 3    Sig: NEW PRESCRIPTION REQUEST: Albuterol Hfa 90 Mcg- INHALE TWO PUFFS BY MOUTH EVERY 4 HOURS AS NEEDED     Pulmonology:  Beta Agonists 2 Passed - 10/11/2022 11:19 AM      Passed - Last BP in normal range    BP Readings from Last 1 Encounters:  10/07/22 118/78         Passed - Last Heart Rate in normal range    Pulse Readings from Last 1 Encounters:  10/07/22 73         Passed - Valid encounter within last 12 months    Recent Outpatient Visits           3 weeks ago Hospital discharge follow-up   [place], MD   1 month ago Anxiety about health   Primary Care at [facility], Cari S, PA-C   3 months ago Essential hypertension   [place], MD   6 months ago Other proteinuria   [place], MD   7 months ago Encounter for [employer] annual wellness exam   [place], MD       Future Appointments             In 1 week [name], MD [place]

## 2022-10-13 DIAGNOSIS — I472 Ventricular tachycardia, unspecified: Secondary | ICD-10-CM | POA: Diagnosis not present

## 2022-10-13 DIAGNOSIS — I4892 Unspecified atrial flutter: Secondary | ICD-10-CM | POA: Diagnosis not present

## 2022-10-13 DIAGNOSIS — T82198D Other mechanical complication of other cardiac electronic device, subsequent encounter: Secondary | ICD-10-CM | POA: Diagnosis not present

## 2022-10-13 DIAGNOSIS — I951 Orthostatic hypotension: Secondary | ICD-10-CM | POA: Diagnosis not present

## 2022-10-13 DIAGNOSIS — M7541 Impingement syndrome of right shoulder: Secondary | ICD-10-CM | POA: Diagnosis not present

## 2022-10-13 DIAGNOSIS — Z7951 Long term (current) use of inhaled steroids: Secondary | ICD-10-CM | POA: Diagnosis not present

## 2022-10-13 DIAGNOSIS — Z9981 Dependence on supplemental oxygen: Secondary | ICD-10-CM | POA: Diagnosis not present

## 2022-10-13 DIAGNOSIS — I2510 Atherosclerotic heart disease of native coronary artery without angina pectoris: Secondary | ICD-10-CM | POA: Diagnosis not present

## 2022-10-13 DIAGNOSIS — Z992 Dependence on renal dialysis: Secondary | ICD-10-CM | POA: Diagnosis not present

## 2022-10-13 DIAGNOSIS — Z792 Long term (current) use of antibiotics: Secondary | ICD-10-CM | POA: Diagnosis not present

## 2022-10-13 DIAGNOSIS — S46111D Strain of muscle, fascia and tendon of long head of biceps, right arm, subsequent encounter: Secondary | ICD-10-CM | POA: Diagnosis not present

## 2022-10-13 DIAGNOSIS — I480 Paroxysmal atrial fibrillation: Secondary | ICD-10-CM | POA: Diagnosis not present

## 2022-10-13 DIAGNOSIS — Z7952 Long term (current) use of systemic steroids: Secondary | ICD-10-CM | POA: Diagnosis not present

## 2022-10-13 DIAGNOSIS — I5023 Acute on chronic systolic (congestive) heart failure: Secondary | ICD-10-CM | POA: Diagnosis not present

## 2022-10-13 DIAGNOSIS — I255 Ischemic cardiomyopathy: Secondary | ICD-10-CM | POA: Diagnosis not present

## 2022-10-13 DIAGNOSIS — I272 Pulmonary hypertension, unspecified: Secondary | ICD-10-CM | POA: Diagnosis not present

## 2022-10-13 DIAGNOSIS — I5084 End stage heart failure: Secondary | ICD-10-CM | POA: Diagnosis not present

## 2022-10-13 DIAGNOSIS — I132 Hypertensive heart and chronic kidney disease with heart failure and with stage 5 chronic kidney disease, or end stage renal disease: Secondary | ICD-10-CM | POA: Diagnosis not present

## 2022-10-13 DIAGNOSIS — N186 End stage renal disease: Secondary | ICD-10-CM | POA: Diagnosis not present

## 2022-10-13 DIAGNOSIS — M6281 Muscle weakness (generalized): Secondary | ICD-10-CM | POA: Diagnosis not present

## 2022-10-13 DIAGNOSIS — Z7901 Long term (current) use of anticoagulants: Secondary | ICD-10-CM | POA: Diagnosis not present

## 2022-10-13 DIAGNOSIS — J9601 Acute respiratory failure with hypoxia: Secondary | ICD-10-CM | POA: Diagnosis not present

## 2022-10-13 DIAGNOSIS — G4733 Obstructive sleep apnea (adult) (pediatric): Secondary | ICD-10-CM | POA: Diagnosis not present

## 2022-10-13 DIAGNOSIS — E785 Hyperlipidemia, unspecified: Secondary | ICD-10-CM | POA: Diagnosis not present

## 2022-10-13 DIAGNOSIS — I051 Rheumatic mitral insufficiency: Secondary | ICD-10-CM | POA: Diagnosis not present

## 2022-10-14 DIAGNOSIS — D631 Anemia in chronic kidney disease: Secondary | ICD-10-CM | POA: Diagnosis not present

## 2022-10-14 DIAGNOSIS — N2581 Secondary hyperparathyroidism of renal origin: Secondary | ICD-10-CM | POA: Diagnosis not present

## 2022-10-14 DIAGNOSIS — N186 End stage renal disease: Secondary | ICD-10-CM | POA: Diagnosis not present

## 2022-10-14 DIAGNOSIS — D509 Iron deficiency anemia, unspecified: Secondary | ICD-10-CM | POA: Diagnosis not present

## 2022-10-14 DIAGNOSIS — Z992 Dependence on renal dialysis: Secondary | ICD-10-CM | POA: Diagnosis not present

## 2022-10-14 DIAGNOSIS — L299 Pruritus, unspecified: Secondary | ICD-10-CM | POA: Diagnosis not present

## 2022-10-16 DIAGNOSIS — N2581 Secondary hyperparathyroidism of renal origin: Secondary | ICD-10-CM | POA: Diagnosis not present

## 2022-10-16 DIAGNOSIS — D509 Iron deficiency anemia, unspecified: Secondary | ICD-10-CM | POA: Diagnosis not present

## 2022-10-16 DIAGNOSIS — Z992 Dependence on renal dialysis: Secondary | ICD-10-CM | POA: Diagnosis not present

## 2022-10-16 DIAGNOSIS — N186 End stage renal disease: Secondary | ICD-10-CM | POA: Diagnosis not present

## 2022-10-16 DIAGNOSIS — L299 Pruritus, unspecified: Secondary | ICD-10-CM | POA: Diagnosis not present

## 2022-10-16 DIAGNOSIS — D631 Anemia in chronic kidney disease: Secondary | ICD-10-CM | POA: Diagnosis not present

## 2022-10-18 ENCOUNTER — Other Ambulatory Visit: Payer: Self-pay | Admitting: Student

## 2022-10-18 ENCOUNTER — Other Ambulatory Visit: Payer: Self-pay

## 2022-10-18 ENCOUNTER — Other Ambulatory Visit: Payer: Self-pay | Admitting: Internal Medicine

## 2022-10-18 DIAGNOSIS — M6281 Muscle weakness (generalized): Secondary | ICD-10-CM | POA: Diagnosis not present

## 2022-10-18 DIAGNOSIS — M7541 Impingement syndrome of right shoulder: Secondary | ICD-10-CM | POA: Diagnosis not present

## 2022-10-18 DIAGNOSIS — S46111D Strain of muscle, fascia and tendon of long head of biceps, right arm, subsequent encounter: Secondary | ICD-10-CM | POA: Diagnosis not present

## 2022-10-19 DIAGNOSIS — D631 Anemia in chronic kidney disease: Secondary | ICD-10-CM | POA: Diagnosis not present

## 2022-10-19 DIAGNOSIS — D509 Iron deficiency anemia, unspecified: Secondary | ICD-10-CM | POA: Diagnosis not present

## 2022-10-19 DIAGNOSIS — N186 End stage renal disease: Secondary | ICD-10-CM | POA: Diagnosis not present

## 2022-10-19 DIAGNOSIS — N2581 Secondary hyperparathyroidism of renal origin: Secondary | ICD-10-CM | POA: Diagnosis not present

## 2022-10-19 DIAGNOSIS — L299 Pruritus, unspecified: Secondary | ICD-10-CM | POA: Diagnosis not present

## 2022-10-19 DIAGNOSIS — Z992 Dependence on renal dialysis: Secondary | ICD-10-CM | POA: Diagnosis not present

## 2022-10-20 NOTE — Telephone Encounter (Signed)
Mariann Laster from [facility], called in says still waiting on signed and dated home health orders,for skilled nursing, medicines, and addendum to orders. She says is 1 month out of compliance, they have faxed this request a few times already. Please assist with this. Fx # is  [phone].

## 2022-10-21 ENCOUNTER — Ambulatory Visit: Payer: Medicare Other | Attending: Internal Medicine | Admitting: Internal Medicine

## 2022-10-21 ENCOUNTER — Encounter: Payer: Self-pay | Admitting: Internal Medicine

## 2022-10-21 VITALS — BP 113/77 | HR 84 | Temp 98.1°F | Ht 67.0 in | Wt 250.0 lb

## 2022-10-21 DIAGNOSIS — I10 Essential (primary) hypertension: Secondary | ICD-10-CM

## 2022-10-21 DIAGNOSIS — Z992 Dependence on renal dialysis: Secondary | ICD-10-CM

## 2022-10-21 DIAGNOSIS — I5022 Chronic systolic (congestive) heart failure: Secondary | ICD-10-CM | POA: Diagnosis not present

## 2022-10-21 DIAGNOSIS — N186 End stage renal disease: Secondary | ICD-10-CM | POA: Diagnosis not present

## 2022-10-21 DIAGNOSIS — D509 Iron deficiency anemia, unspecified: Secondary | ICD-10-CM | POA: Diagnosis not present

## 2022-10-21 DIAGNOSIS — I2510 Atherosclerotic heart disease of native coronary artery without angina pectoris: Secondary | ICD-10-CM

## 2022-10-21 DIAGNOSIS — G4733 Obstructive sleep apnea (adult) (pediatric): Secondary | ICD-10-CM | POA: Diagnosis not present

## 2022-10-21 DIAGNOSIS — D631 Anemia in chronic kidney disease: Secondary | ICD-10-CM | POA: Diagnosis not present

## 2022-10-21 DIAGNOSIS — L299 Pruritus, unspecified: Secondary | ICD-10-CM | POA: Diagnosis not present

## 2022-10-21 DIAGNOSIS — N2581 Secondary hyperparathyroidism of renal origin: Secondary | ICD-10-CM | POA: Diagnosis not present

## 2022-10-21 NOTE — Telephone Encounter (Signed)
All paperwork has been faxed to [phone]

## 2022-10-21 NOTE — Progress Notes (Signed)
Patient ID: Victor Thompson, male    DOB: Apr 23, 1961  MRN: [id number]  CC: Follow-up (No questions / concerns. /No to flu vax.)   Subjective: Victor Thompson is a 61 y.o. male who presents for chronic ds management His concerns today include:  Pt with hx of ESRD on hemodialysis (Dr. Joelyn Oms), HTN, CAD (with hx of cardiac arrest), ICD, combined sys/dia CHF (EF 25-30 % 01/2021 and 10/2021), HL, OSA on CPAP, Obese, COPD, chronic lower back pain with multilevel spondylosis.     After last visit with me, I had touch base with cardiology.  He is on prednisone for possible sarcoid cardiomyopathy.  Patient tells me that he was told by the cardiologist he will be referred to [facility] cardiology for further evaluation but he has not received a call as yet.  Currently on prednisone which has been decreased to 20 mg daily.  Prednisone makes him feel shaky and has increased his appetite.  -His ICD remains off.  However patient states that he wants to be resuscitated in the event of a medical emergency.  Never did sign papers for DNR. HTN/CAD:  no CP, LE edema, SOB Taking meds which include amiodarone, atorvastatin, carvedilol,    OSA/CPAP:  using CPAP consistently and feels he benefits from use.   ESRD:  just came from HD.  Continues with hemodialysis 3 days a week.  Patient Active Problem List   Diagnosis Date Noted   Paroxysmal atrial fibrillation (HCC)    Nausea and vomiting    Paroxysmal ventricular tachycardia ([place]) 08/24/2022   VT (ventricular tachycardia) ([place]) 08/16/2022   Chronic obstructive pulmonary disease, unspecified COPD type ([place]) 06/18/2022   Acute respiratory failure with hypoxia ([place]) 05/28/2022   AICD discharge 05/25/2022   Seasonal and perennial allergic rhinitis 11/12/2021   Moderate persistent asthma, uncomplicated 38/09/1750   STD exposure 08/27/2021   ESRD on dialysis ([place]) 02/06/2021   Hypervolemia associated with renal insufficiency 01/12/2021   Angioedema 09/22/2020    Acute on chronic HFrEF (heart failure with reduced ejection fraction) ([place]) 06/14/2020   Secondary hyperparathyroidism of renal origin ([place]) 07/15/2019   Vitamin D deficiency 07/15/2019   CAD (coronary artery disease), native coronary artery 04/10/2019   Chronic midline low back pain without sciatica 02/17/2018   Ventricular tachycardia ([place]) 03/29/2016   Defibrillator discharge    HFrEF (heart failure with reduced ejection fraction) ([place]) 01/22/2016   Erectile dysfunction 01/22/2016   Allergic rhinitis 01/22/2016   GERD (gastroesophageal reflux disease) 01/22/2016   PROTEINURIA 02/17/2010   Dilated cardiomyopathy ([place]) 02/16/2010   Obstructive sleep apnea 02/16/2010   EXTERNAL HEMORRHOIDS 09/26/2009   Tobacco use disorder in remission 06/03/2009   Essential hypertension 10/01/2008   Hyperlipidemia 07/14/2007   Class 3 severe obesity due to excess calories with serious comorbidity and body mass index (BMI) of 40.0 to 44.9 in adult [facility]) 07/14/2007   Asthma 07/14/2007     Current Outpatient Medications on File Prior to Visit  Medication Sig Dispense Refill   albuterol (PROVENTIL) (2.5 MG/3ML) 0.083% nebulizer solution NEW PRESCRIPTION REQUEST: Albuterol Sul 2.5 Mg/3 Ml- INHALE THE CONTENTS OF 1 VIAL BY MOUTH EVERY 6 HOURS AS NEEDED 675 mL 0   albuterol (VENTOLIN HFA) 108 (90 Base) MCG/ACT inhaler NEW PRESCRIPTION REQUEST: Albuterol Hfa 90 Mcg- INHALE TWO PUFFS BY MOUTH EVERY 4 HOURS AS NEEDED 60.3 g 0   amiodarone (PACERONE) 200 MG tablet NEW PRESCRIPTION REQUEST: Amiodarone Hcl 200 Mg- TAKE TWO TABLETS BY MOUTH TWICE DAILY 360 tablet 3  atorvastatin (LIPITOR) 80 MG tablet Take 1 tablet (80 mg total) by mouth at bedtime. 90 tablet 3   AURYXIA 1 GM 210 MG(Fe) tablet Take 1 tablet (210 mg total) by mouth 3 (three) times daily. 270 tablet 1   budesonide-formoterol (SYMBICORT) 80-4.5 MCG/ACT inhaler NEW PRESCRIPTION REQUEST: Symbicort 80-4.5 Mcg- INHALE TWO PUFFS BY MOUTH TWICE DAILY 30.6 g  0   calcium acetate (PHOSLO) 667 MG capsule Take 2 capsule by mouth three times a day with meals (Patient taking differently: Take 1,334 mg by mouth 3 (three) times daily with meals.) 180 capsule 6   carvedilol (COREG) 12.5 MG tablet NEW PRESCRIPTION REQUEST: Carvedilol 12.5 Mg- TAKE ONE TABLET BY MOUTH TWICE DAILY 180 tablet 3   cetirizine (ZYRTEC) 10 MG tablet Take 1 tablet (10 mg total) by mouth 2 (two) times daily. 60 tablet 6   Cholecalciferol (VITAMIN D-3) 125 MCG (5000 UT) TABS Take 5,000 Units by mouth daily.     docusate sodium (COLACE) 100 MG capsule NEW PRESCRIPTION REQUEST: Docusate Sodium 100 Mg- TAKE ONE CAPSULE BY MOUTH DAILY 90 capsule 0   ELIQUIS 5 MG TABS tablet NEW PRESCRIPTION REQUEST: Eliquis 5 Mg - TAKE ONE TABLET BY MOUTH TWICE DAILY 180 tablet 3   EPINEPHrine 0.3 mg/0.3 mL IJ SOAJ injection Inject 0.3 mg into the muscle once as needed for up to 2 doses (if worsening tongue swelling, SOB, hypoxia, or other concerns for progressive anaphylaxis). 2 each 2   ethyl chloride spray SPRAY A SMALL AMOUNT THREE TIMES A WEEK JUST PRIOR TO NEEDLE INSERTION (Patient taking differently: Apply 1 Application topically 3 (three) times a week.) 116 mL 11   fluticasone (FLONASE) 50 MCG/ACT nasal spray NEW PRESCRIPTION REQUEST: Fluticasone Prop 50 Mcg- INSTILL 1 SPRAY IN EACH NOSTRIL DAILY AS NEEDED 47.4 g 0   hydrOXYzine (ATARAX) 25 MG tablet Take 1-2 tablets (25-50 mg total) by mouth at bedtime as needed for anxiety/insomnia. 60 tablet 1   linaclotide (LINZESS) 72 MCG capsule Take 1 capsule (72 mcg total) by mouth every Monday, Wednesday, Friday, and Saturday. 30 capsule 1   mexiletine (MEXITIL) 150 MG capsule NEW PRESCRIPTION REQUEST: Mexiletine 150 Mg- TAKE TWO CAPSULES BY MOUTH TWICE DAILY 360 capsule 3   pantoprazole (PROTONIX) 40 MG tablet NEW PRESCRIPTION REQUEST: Pantoprazole Sod Dr 40 Mg - TAKE ONE TABLET BY MOUTH DAILY 90 tablet 3   Polyethylene Glycol 3350 (PEG 3350) 17 g PACK NEW  PRESCRIPTION REQUEST: Polyethylene Glycol 3350- 1 PACKET BY MOUTH DAILY AS NEEDED 90 packet 0   predniSONE (DELTASONE) 10 MG tablet Take 2 tablets (20 mg total) by mouth daily with breakfast. 60 tablet 3   Rectal Protectant-Emollient (CALMOL-4) 76-10 % SUPP Use as directed once to twice daily (Patient taking differently: Place 1 suppository rectally 2 (two) times daily as needed (constipation). Use as directed once to twice daily) 6 suppository 2   sildenafil (VIAGRA) 50 MG tablet Take 50 mg by mouth daily as needed.     sulfamethoxazole-trimethoprim (BACTRIM) 400-80 MG tablet Take 1 tablet by mouth 3 (three) times a week. Will take as long as you are on prednisone 36 tablet 0   VELPHORO 500 MG chewable tablet Chew 1,000 mg by mouth 3 (three) times daily.     No current facility-administered medications on file prior to visit.    Allergies  Allergen Reactions   Ace Inhibitors Swelling    Swelling of the tongue   Influenza Vaccines Hives and Swelling    SWELLING REACTION UNSPECIFIED  Ketorolac Swelling    SWELLING REACTION UNSPECIFIED    Lidocaine Swelling    TONGUE SWELLS   Lisinopril Swelling    TONGUE SWELLING Pt reported problem with a BP med which sounded like  lisinopril  But as of 09/19/06,pt had tolerated altace without problem   Penicillins Swelling    TONGUE SWELLS Has patient had a PCN reaction causing immediate rash, facial/tongue/throat swelling, SOB or lightheadedness with hypotension: Yes Has patient had a PCN reaction causing severe rash involving mucus membranes or skin necrosis: No Has patient had a PCN reaction that required hospitalization No Has patient had a PCN reaction occurring within the last 10 years: Yes If all of the above answers are "NO", then may proceed with Cephalosporin use.     Social History   Socioeconomic History   Marital status: Single    Spouse name: Not on file   Number of children: 3   Years of education: Not on file   Highest  education level: Not on file  Occupational History   Occupation: retired  Tobacco Use   Smoking status: Never   Smokeless tobacco: Never  Vaping Use   Vaping Use: Never used  Substance and Sexual Activity   Alcohol use: No   Drug use: No   Sexual activity: Yes  Other Topics Concern   Not on file  Social History Narrative   ** Merged History Encounter **       Social Determinants of Health   Financial Resource Strain: Not on file  Food Insecurity: No Food Insecurity (08/28/2022)   Hunger Vital Sign    Worried About Running Out of Food in the Last Year: Never true    Ran Out of Food in the Last Year: Never true  Transportation Needs: No Transportation Needs (09/15/2022)   PRAPARE - Hydrologist (Medical): No    Lack of Transportation (Non-Medical): No  Physical Activity: Insufficiently Active (08/20/2022)   Exercise Vital Sign    Days of Exercise per Week: 1 day    Minutes of Exercise per Session: 10 min  Stress: Not on file  Social Connections: Not on file  Intimate Partner Violence: Not At Risk (08/28/2022)   Humiliation, Afraid, Rape, and Kick questionnaire    Fear of Current or Ex-Partner: No    Emotionally Abused: No    Physically Abused: No    Sexually Abused: No    Family History  Problem Relation Age of Onset   Hypertension Mother    Liver disease Father    Colon cancer Neg Hx    Esophageal cancer Neg Hx    Rectal cancer Neg Hx    Stomach cancer Neg Hx     Past Surgical History:  Procedure Laterality Date   AV FISTULA PLACEMENT Right 03/15/2019   Procedure: RIGHT ARM ARTERIOVENOUS (AV) FISTULA CREATION;  Surgeon: Angelia Mould, MD;  Location: [facility];  Service: Vascular;  Laterality: Right;   COLONOSCOPY     COLONOSCOPY W/ BIOPSIES AND POLYPECTOMY     CORONARY STENT PLACEMENT  2007   IMPLANTABLE CARDIOVERTER DEFIBRILLATOR IMPLANT  2015   RIGHT/LEFT HEART CATH AND CORONARY ANGIOGRAPHY N/A 07/08/2021   Procedure: RIGHT/LEFT  HEART CATH AND CORONARY ANGIOGRAPHY;  Surgeon: Larey Dresser, MD;  Location: [place] [facility];  Service: Cardiovascular;  Laterality: N/A;   RIGHT/LEFT HEART CATH AND CORONARY ANGIOGRAPHY N/A 08/25/2022   Procedure: RIGHT/LEFT HEART CATH AND CORONARY ANGIOGRAPHY;  Surgeon: Larey Dresser, MD;  Location:  [place] [facility];  Service: Cardiovascular;  Laterality: N/A;   SUBQ ICD CHANGEOUT N/A 10/13/2020   Procedure: SUBQ ICD CHANGEOUT;  Surgeon: Evans Lance, MD;  Location: [place] [facility];  Service: Cardiovascular;  Laterality: N/A;    ROS: Review of Systems Negative except as stated above  PHYSICAL EXAM: BP 113/77 (BP Location: Left Arm, Patient Position: Sitting, Cuff Size: Large)   Pulse 84   Temp 98.1 F (36.7 C) (Oral)   Ht '5\' 7"'$  (1.702 m)   Wt 250 lb (113.4 kg)   SpO2 94%   BMI 39.16 kg/m   Physical Exam   General appearance - alert, well appearing, obese older African-American male and in no distress Mental status - normal mood, behavior, speech, dress, motor activity, and thought processes Neck - supple, no significant adenopathy Chest - clear to auscultation, no wheezes, rales or rhonchi, symmetric air entry Heart - normal rate, regular rhythm, normal S1, S2, no murmurs, rubs, clicks or gallops Extremities - peripheral pulses normal, no pedal edema, no clubbing or cyanosis     Latest Ref Rng & Units 09/29/2022    1:36 AM 09/25/2022   12:59 PM 09/10/2022    5:07 AM  CMP  Glucose 70 - 99 mg/dL 107  127  104   BUN 6 - 20 mg/dL 29  13  62   Creatinine 0.61 - 1.24 mg/dL 7.98  4.58  7.45   Sodium 135 - 145 mmol/L 143  141  136   Potassium 3.5 - 5.1 mmol/L 3.8  3.2  4.2   Chloride 98 - 111 mmol/L 97  95  98   CO2 22 - 32 mmol/L 29  34  23   Calcium 8.9 - 10.3 mg/dL 7.8  7.9  8.2   Total Protein 6.5 - 8.1 g/dL  6.6    Total Bilirubin 0.3 - 1.2 mg/dL  0.3    Alkaline Phos 38 - 126 U/L  82    AST 15 - 41 U/L  20    ALT 0 - 44 U/L  36     Lipid Panel      Component Value Date/Time   CHOL 230 (H) 08/16/2022 1004   TRIG 47 09/01/2022 0354   HDL 27 (L) 08/16/2022 1004   CHOLHDL 8.5 08/16/2022 1004   VLDL 39 08/16/2022 1004   LDLCALC 164 (H) 08/16/2022 1004    CBC    Component Value Date/Time   WBC 5.5 09/29/2022 0136   RBC 3.14 (L) 09/29/2022 0136   HGB 10.3 (L) 09/29/2022 0136   HGB 12.0 (L) 09/08/2021 1450   HCT 31.6 (L) 09/29/2022 0136   HCT 35.4 (L) 09/08/2021 1450   PLT 167 09/29/2022 0136   PLT 179 10/10/2020 0925   MCV 100.6 (H) 09/29/2022 0136   MCV 95 09/08/2021 1450   MCH 32.8 09/29/2022 0136   MCHC 32.6 09/29/2022 0136   RDW 17.4 (H) 09/29/2022 0136   RDW 14.2 09/08/2021 1450   LYMPHSABS 0.6 (L) 09/29/2022 0136   LYMPHSABS 0.8 09/08/2021 1450   MONOABS 0.8 09/29/2022 0136   EOSABS 0.0 09/29/2022 0136   EOSABS 0.3 09/08/2021 1450   BASOSABS 0.0 09/29/2022 0136   BASOSABS 0.0 09/08/2021 1450    ASSESSMENT AND PLAN: 1. Chronic systolic congestive heart failure, NYHA class 2 (HCC) Stable and compensated on HD 3 days a week. Message sent to his cardiologist inquiring about referral to [facility] cardiology. He continues on prednisone for presumed diagnosis of sarcoid centimeters.  Prednisone being tapered by his cardiologist.  2. Essential hypertension Control on carvedilol.  3. CAD in native artery Stable  Continue carvedilol, atorvastatin  4. OSA on CPAP He will continue to use his CPAP nightly.  Reports benefit from use.  5. ESRD on dialysis [facility]) He continues on hemodialysis 3 days a week    Patient was given the opportunity to ask questions.  Patient verbalized understanding of the plan and was able to repeat key elements of the plan.   This documentation was completed using Radio producer.  Any transcriptional errors are unintentional.  No orders of the defined types were placed in this encounter.    Requested Prescriptions    No prescriptions requested or ordered in this  encounter    No follow-ups on file.  Karle Plumber, MD, FACP

## 2022-10-22 ENCOUNTER — Other Ambulatory Visit: Payer: Self-pay

## 2022-10-23 DIAGNOSIS — L299 Pruritus, unspecified: Secondary | ICD-10-CM | POA: Diagnosis not present

## 2022-10-23 DIAGNOSIS — N2581 Secondary hyperparathyroidism of renal origin: Secondary | ICD-10-CM | POA: Diagnosis not present

## 2022-10-23 DIAGNOSIS — D631 Anemia in chronic kidney disease: Secondary | ICD-10-CM | POA: Diagnosis not present

## 2022-10-23 DIAGNOSIS — N186 End stage renal disease: Secondary | ICD-10-CM | POA: Diagnosis not present

## 2022-10-23 DIAGNOSIS — Z992 Dependence on renal dialysis: Secondary | ICD-10-CM | POA: Diagnosis not present

## 2022-10-23 DIAGNOSIS — D509 Iron deficiency anemia, unspecified: Secondary | ICD-10-CM | POA: Diagnosis not present

## 2022-10-25 DIAGNOSIS — N2581 Secondary hyperparathyroidism of renal origin: Secondary | ICD-10-CM | POA: Diagnosis not present

## 2022-10-25 DIAGNOSIS — D631 Anemia in chronic kidney disease: Secondary | ICD-10-CM | POA: Diagnosis not present

## 2022-10-25 DIAGNOSIS — N186 End stage renal disease: Secondary | ICD-10-CM | POA: Diagnosis not present

## 2022-10-25 DIAGNOSIS — L299 Pruritus, unspecified: Secondary | ICD-10-CM | POA: Diagnosis not present

## 2022-10-25 DIAGNOSIS — Z992 Dependence on renal dialysis: Secondary | ICD-10-CM | POA: Diagnosis not present

## 2022-10-25 DIAGNOSIS — D509 Iron deficiency anemia, unspecified: Secondary | ICD-10-CM | POA: Diagnosis not present

## 2022-10-26 ENCOUNTER — Emergency Department (HOSPITAL_COMMUNITY): Payer: Medicare Other

## 2022-10-26 ENCOUNTER — Emergency Department (HOSPITAL_COMMUNITY)
Admission: EM | Admit: 2022-10-26 | Discharge: 2022-10-27 | Disposition: A | Discharge status: Home / Self Care | Payer: Medicare Other | Attending: Emergency Medicine | Admitting: Emergency Medicine

## 2022-10-26 ENCOUNTER — Other Ambulatory Visit: Payer: Self-pay

## 2022-10-26 ENCOUNTER — Encounter (HOSPITAL_COMMUNITY): Payer: Self-pay

## 2022-10-26 DIAGNOSIS — I132 Hypertensive heart and chronic kidney disease with heart failure and with stage 5 chronic kidney disease, or end stage renal disease: Secondary | ICD-10-CM | POA: Diagnosis not present

## 2022-10-26 DIAGNOSIS — Z950 Presence of cardiac pacemaker: Secondary | ICD-10-CM | POA: Diagnosis not present

## 2022-10-26 DIAGNOSIS — D631 Anemia in chronic kidney disease: Secondary | ICD-10-CM | POA: Insufficient documentation

## 2022-10-26 DIAGNOSIS — I509 Heart failure, unspecified: Secondary | ICD-10-CM | POA: Insufficient documentation

## 2022-10-26 DIAGNOSIS — R002 Palpitations: Secondary | ICD-10-CM | POA: Diagnosis not present

## 2022-10-26 DIAGNOSIS — N186 End stage renal disease: Secondary | ICD-10-CM | POA: Diagnosis not present

## 2022-10-26 DIAGNOSIS — Z7901 Long term (current) use of anticoagulants: Secondary | ICD-10-CM | POA: Diagnosis not present

## 2022-10-26 DIAGNOSIS — N189 Chronic kidney disease, unspecified: Secondary | ICD-10-CM

## 2022-10-26 DIAGNOSIS — I2510 Atherosclerotic heart disease of native coronary artery without angina pectoris: Secondary | ICD-10-CM | POA: Diagnosis not present

## 2022-10-26 DIAGNOSIS — I120 Hypertensive chronic kidney disease with stage 5 chronic kidney disease or end stage renal disease: Secondary | ICD-10-CM | POA: Diagnosis not present

## 2022-10-26 DIAGNOSIS — Z992 Dependence on renal dialysis: Secondary | ICD-10-CM | POA: Insufficient documentation

## 2022-10-26 DIAGNOSIS — Z79899 Other long term (current) drug therapy: Secondary | ICD-10-CM | POA: Diagnosis not present

## 2022-10-26 LAB — CBC
HCT: 40.9 % (ref 39.0–52.0)
Hemoglobin: 12.9 g/dL — ABNORMAL LOW (ref 13.0–17.0)
MCH: 33.1 pg (ref 26.0–34.0)
MCHC: 31.5 g/dL (ref 30.0–36.0)
MCV: 104.9 fL — ABNORMAL HIGH (ref 80.0–100.0)
Platelets: 235 10*3/uL (ref 150–400)
RBC: 3.9 MIL/uL — ABNORMAL LOW (ref 4.22–5.81)
RDW: 18.4 % — ABNORMAL HIGH (ref 11.5–15.5)
WBC: 7.2 10*3/uL (ref 4.0–10.5)
nRBC: 0.4 % — ABNORMAL HIGH (ref 0.0–0.2)

## 2022-10-26 LAB — BASIC METABOLIC PANEL
Anion gap: 20 — ABNORMAL HIGH (ref 5–15)
BUN: 39 mg/dL — ABNORMAL HIGH (ref 6–20)
CO2: 27 mmol/L (ref 22–32)
Calcium: 8.1 mg/dL — ABNORMAL LOW (ref 8.9–10.3)
Chloride: 98 mmol/L (ref 98–111)
Creatinine, Ser: 10.64 mg/dL — ABNORMAL HIGH (ref 0.61–1.24)
GFR, Estimated: 5 mL/min — ABNORMAL LOW (ref >60)
Glucose, Bld: 111 mg/dL — ABNORMAL HIGH (ref 70–99)
Potassium: 3.9 mmol/L (ref 3.5–5.1)
Sodium: 145 mmol/L (ref 135–145)

## 2022-10-26 LAB — TROPONIN I (HIGH SENSITIVITY): Troponin I (High Sensitivity): 74 ng/L — ABNORMAL HIGH (ref <18)

## 2022-10-26 NOTE — ED Provider Triage Note (Signed)
Emergency Medicine Provider Triage Evaluation Note  Victor Thompson , a 61 y.o. male  was evaluated in triage.  Pt complains of palpitation. States he has had a few episode of palpitations since 8 pm. Each episode last for a few seconds and resolved. History of [name] has a defibrillator however patient states it has been turned off because he kept getting shocks. Denies chest pain, shortness of breath, nausea, vomiting, bowel changes, urinary symptoms, vision changes, weakness or numbness.  Review of Systems  Positive: As above  Negative: As above  Physical Exam  BP 131/77 (BP Location: Left Arm)   Pulse 88   Temp 98.6 F (37 C) (Oral)   Resp 20   Ht '5\' 7"'$  (1.702 m)   Wt 113.4 kg   SpO2 96%   BMI 39.16 kg/m  Gen:   Awake, no distress   Resp:  Normal effort  MSK:   Moves extremities without difficulty  Other:  Normal sinus  Medical Decision Making  Medically screening exam initiated at 9:09 PM.  Appropriate orders placed.  [name] was informed that the remainder of the evaluation will be completed by another provider, this initial triage assessment does not replace that evaluation, and the importance of remaining in the ED until their evaluation is complete.     Rex Kras, [place] 10/26/22 2115

## 2022-10-26 NOTE — ED Notes (Signed)
Per registration pt had to go back to his car and will let triage know when pt is back.

## 2022-10-26 NOTE — ED Provider Notes (Signed)
[facility] [facility] EMERGENCY DEPARTMENT Provider Note   CSN: [id number] Arrival date & time: 10/26/22  2035     History {Add pertinent medical, surgical, social history, OB history to HPI:1} Chief Complaint  Patient presents with   Palpitations    CROY DRUMWRIGHT is a 61 y.o. male.  The history is provided by the patient.  Palpitations He has history of hypertension, hyperlipidemia, end-stage renal disease on hemodialysis, coronary artery disease, heart failure, status post implantation of AICD which is currently turned off and comes in because of 2 episodes of palpitations.  He described a fluttering feeling in his chest which only lasted a few seconds before resolving.  It recurred a few minutes later and also resolved after a few seconds.  He denies chest pain, heaviness, tightness, pressure.  He denies dyspnea, nausea, diaphoresis.  He is scheduled for dialysis tomorrow.   Home Medications Prior to Admission medications   Medication Sig Start Date End Date Taking? Authorizing Provider  albuterol (PROVENTIL) (2.5 MG/3ML) 0.083% nebulizer solution NEW PRESCRIPTION REQUEST: Albuterol Sul 2.5 Mg/3 Ml- INHALE THE CONTENTS OF 1 VIAL BY MOUTH EVERY 6 HOURS AS NEEDED 10/12/22   [name], MD  albuterol (VENTOLIN HFA) 108 (90 Base) MCG/ACT inhaler NEW PRESCRIPTION REQUEST: Albuterol Hfa 90 Mcg- INHALE TWO PUFFS BY MOUTH EVERY 4 HOURS AS NEEDED 10/12/22   [name], MD  amiodarone (PACERONE) 200 MG tablet NEW PRESCRIPTION REQUEST: Amiodarone Hcl 200 Mg- TAKE TWO TABLETS BY MOUTH TWICE DAILY 10/11/22   Larey Dresser, MD  atorvastatin (LIPITOR) 80 MG tablet Take 1 tablet (80 mg total) by mouth at bedtime. 10/12/22 10/12/23  Larey Dresser, MD  AURYXIA 1 GM 210 MG(Fe) tablet Take 1 tablet (210 mg total) by mouth 3 (three) times daily. 08/12/21   [name], MD  budesonide-formoterol (SYMBICORT) 80-4.5 MCG/ACT inhaler NEW PRESCRIPTION REQUEST: Symbicort 80-4.5  Mcg- INHALE TWO PUFFS BY MOUTH TWICE DAILY 10/12/22   [name], MD  calcium acetate (PHOSLO) 667 MG capsule Take 2 capsule by mouth three times a day with meals Patient taking differently: Take 1,334 mg by mouth 3 (three) times daily with meals. 06/09/21     carvedilol (COREG) 12.5 MG tablet NEW PRESCRIPTION REQUEST: Carvedilol 12.5 Mg- TAKE ONE TABLET BY MOUTH TWICE DAILY 10/11/22   Larey Dresser, MD  cetirizine (ZYRTEC) 10 MG tablet Take 1 tablet (10 mg total) by mouth 2 (two) times daily. 09/24/22   [place], Maricela Bo, FNP  Cholecalciferol (VITAMIN D-3) 125 MCG (5000 UT) TABS Take 5,000 Units by mouth daily.    [provider]  docusate sodium (COLACE) 100 MG capsule NEW PRESCRIPTION REQUEST: Docusate Sodium 100 Mg- TAKE ONE CAPSULE BY MOUTH DAILY 10/12/22   [name], MD  ELIQUIS 5 MG TABS tablet NEW PRESCRIPTION REQUEST: Eliquis 5 Mg - TAKE ONE TABLET BY MOUTH TWICE DAILY 10/11/22   Larey Dresser, MD  EPINEPHrine 0.3 mg/0.3 mL IJ SOAJ injection Inject 0.3 mg into the muscle once as needed for up to 2 doses (if worsening tongue swelling, SOB, hypoxia, or other concerns for progressive anaphylaxis). 07/15/21   [name], MD  ethyl chloride spray SPRAY A SMALL AMOUNT THREE TIMES A WEEK JUST PRIOR TO NEEDLE INSERTION Patient taking differently: Apply 1 Application topically 3 (three) times a week. 10/26/21   [name], MD  fluticasone (FLONASE) 50 MCG/ACT nasal spray NEW PRESCRIPTION REQUEST: Fluticasone Prop 50 Mcg- INSTILL 1 SPRAY IN [facility]  NOSTRIL DAILY AS NEEDED 10/12/22   [name], MD  hydrOXYzine (ATARAX) 25 MG tablet Take 1-2 tablets (25-50 mg total) by mouth at bedtime as needed for anxiety/insomnia. 09/13/22   [name], MD  linaclotide Rolan Lipa) 72 MCG capsule Take 1 capsule (72 mcg total) by mouth every Monday, Wednesday, Friday, and Saturday. 09/29/22   [name], MD  mexiletine (MEXITIL) 150 MG capsule NEW PRESCRIPTION  REQUEST: Mexiletine 150 Mg- TAKE TWO CAPSULES BY MOUTH TWICE DAILY 10/11/22   Larey Dresser, MD  pantoprazole (PROTONIX) 40 MG tablet NEW PRESCRIPTION REQUEST: Pantoprazole Sod Dr 40 Mg - TAKE ONE TABLET BY MOUTH DAILY 10/11/22   Larey Dresser, MD  Polyethylene Glycol 3350 (PEG 3350) 17 g PACK NEW PRESCRIPTION REQUEST: Polyethylene Glycol 3350- 1 PACKET BY MOUTH DAILY AS NEEDED 10/12/22   [name], MD  predniSONE (DELTASONE) 10 MG tablet Take 2 tablets (20 mg total) by mouth daily with breakfast. 09/24/22   Evans Lance, MD  Rectal Protectant-Emollient (CALMOL-4) 76-10 % SUPP Use as directed once to twice daily Patient taking differently: Place 1 suppository rectally 2 (two) times daily as needed (constipation). Use as directed once to twice daily 06/18/22   [name], MD  sildenafil (VIAGRA) 50 MG tablet Take 50 mg by mouth daily as needed. 09/06/22   [provider]  sulfamethoxazole-trimethoprim (BACTRIM) 400-80 MG tablet Take 1 tablet by mouth 3 (three) times a week. Will take as long as you are on prednisone 09/13/22 12/12/22  Shirley Friar, PA-C  VELPHORO 500 MG chewable tablet Chew 1,000 mg by mouth 3 (three) times daily. 08/06/22   [provider]      Allergies    Ace inhibitors, Influenza vaccines, Ketorolac, Lidocaine, Lisinopril, and Penicillins    Review of Systems   Review of Systems  Cardiovascular:  Positive for palpitations.  All other systems reviewed and are negative.   Physical Exam Updated Vital Signs BP 92/60   Pulse 79   Temp 98.6 F (37 C) (Oral)   Resp (!) 9   Ht '5\' 7"'$  (1.702 m)   Wt 113.4 kg   SpO2 95%   BMI 39.16 kg/m  Physical Exam Vitals and nursing note reviewed.   61 year old male, resting comfortably and in no acute distress. Vital signs are normal. Oxygen saturation is 95%, which is normal. Head is normocephalic and atraumatic. PERRLA, EOMI. Oropharynx is clear. Neck is nontender and supple without  adenopathy or JVD. Back is nontender and there is no CVA tenderness. Lungs are clear without rales, wheezes, or rhonchi. Chest is nontender. Heart has regular rate and rhythm without murmur. Abdomen is soft, flat, nontender. Extremities have no cyanosis or edema, full range of motion is present.  AV fistula present in the right antecubital area with thrill present Skin is warm and dry without rash. Neurologic: Mental status is normal, cranial nerves are intact, moves all extremities equally.  ED Results / Procedures / Treatments   Labs (all labs ordered are listed, but only abnormal results are displayed) Labs Reviewed  BASIC METABOLIC PANEL - Abnormal; Notable for the following components:      Result Value   Glucose, Bld 111 (*)    BUN 39 (*)    Creatinine, Ser 10.64 (*)    Calcium 8.1 (*)    GFR, Estimated 5 (*)    Anion gap 20 (*)    All other components within normal limits  CBC - Abnormal;  Notable for the following components:   RBC 3.90 (*)    Hemoglobin 12.9 (*)    MCV 104.9 (*)    RDW 18.4 (*)    nRBC 0.4 (*)    All other components within normal limits  TROPONIN I (HIGH SENSITIVITY) - Abnormal; Notable for the following components:   Troponin I (High Sensitivity) 74 (*)    All other components within normal limits  TROPONIN I (HIGH SENSITIVITY)    EKG EKG Interpretation  Date/Time:  Tuesday October 26 2022 20:49:00 EST Ventricular Rate:  91 PR Interval:  198 QRS Duration: 172 QT Interval:  432 QTC Calculation: 531 R Axis:   -67 Text Interpretation: Normal sinus rhythm Left bundle branch block Abnormal ECG When compared with ECG of 29-Sep-2022 04:11, No significant change was found Confirmed by Delora Fuel ([id number]) on 10/26/2022 11:01:44 PM  Radiology DG Chest 2 View  Result Date: 10/26/2022 CLINICAL DATA:  Palpitations EXAM: CHEST - 2 VIEW COMPARISON:  Chest x-ray 09/29/2022 FINDINGS: Cardiomediastinal silhouette is within normal limits. Anterior  defibrillator is unchanged in position. There is no focal lung infiltrate, pleural effusion or pneumothorax. No acute fractures are seen. IMPRESSION: No active cardiopulmonary disease. Electronically Signed   By: Ronney Asters M.D.   On: 10/26/2022 21:30    Procedures Procedures  Cardiac monitor shows normal sinus rhythm, per my interpretation.  Medications Ordered in ED Medications - No data to display  ED Course/ Medical Decision Making/ A&P                           Medical Decision Making  2 episodes of palpitations which were self-limited.  I have reviewed and interpreted his electrocardiogram, and my interpretation is left bundle branch block unchanged from prior, no arrhythmia.  Chest x-ray shows no active cardiopulmonary disease.  I have independently viewed the images, and agree with radiologist's interpretation.  I have reviewed and interpreted his laboratory tests and my interpretation is elevated BUN and creatinine consistent with end-stage renal disease, elevated random glucose, normal serum potassium, mild macrocytic anemia improved compared with hemoglobin of 09/29/2022, mildly elevated troponin which appears to be chronic and is likely related to end-stage renal disease.  He will need a repeat troponin to make sure that it is not rising.  I have reviewed his past records, and he has had episodes of ventricular tachycardia.  Heart catheterization on 08/25/2022 showed no critical lesions.  Echocardiogram on 08/25/2022 showed ejection fraction of 20-25% and grade 3 diastolic dysfunction.  Pacemaker device check on 06/23/2022 reported episodes of atrial fibrillation, but strips actually show that these were sinus rhythm with some PVCs and not atrial fibrillation.  I have low index of suspicion for atrial fibrillation.  He is currently anticoagulated on apixaban.  He may benefit from outpatient cardiac monitoring to see if he is having episodes of ventricular tachycardia or atrial fibrillation.   However, this decision should be made at the cardiology clinic.  He is currently pending repeat troponin and if not significantly changed, anticipate discharge.  {Document critical care time when appropriate:1} {Document review of labs and clinical decision tools ie heart score, Chads2Vasc2 etc:1}  {Document your independent review of radiology images, and any outside records:1} {Document your discussion with family members, caretakers, and with consultants:1} {Document social determinants of health affecting pt's care:1} {Document your decision making why or why not admission, treatments were needed:1} Final Clinical Impression(s) / ED Diagnoses Final diagnoses:  None  Rx / DC Orders ED Discharge Orders     None

## 2022-10-26 NOTE — ED Triage Notes (Signed)
Pt states he was sitting in chair and felt his heart beating fast and irregularly.  Pt c/o left arm pain, states he does not know if it is his bursitis or not. Pt states his defibrillator is currently off.  Pt denies chest pain/shortness of breath. Pt states he is due for dialysis tomorrow.

## 2022-10-27 DIAGNOSIS — L299 Pruritus, unspecified: Secondary | ICD-10-CM | POA: Diagnosis not present

## 2022-10-27 DIAGNOSIS — D509 Iron deficiency anemia, unspecified: Secondary | ICD-10-CM | POA: Diagnosis not present

## 2022-10-27 DIAGNOSIS — Z992 Dependence on renal dialysis: Secondary | ICD-10-CM | POA: Diagnosis not present

## 2022-10-27 DIAGNOSIS — N186 End stage renal disease: Secondary | ICD-10-CM | POA: Diagnosis not present

## 2022-10-27 DIAGNOSIS — N2581 Secondary hyperparathyroidism of renal origin: Secondary | ICD-10-CM | POA: Diagnosis not present

## 2022-10-27 DIAGNOSIS — D631 Anemia in chronic kidney disease: Secondary | ICD-10-CM | POA: Diagnosis not present

## 2022-10-27 LAB — TROPONIN I (HIGH SENSITIVITY): Troponin I (High Sensitivity): 77 ng/L — ABNORMAL HIGH (ref <18)

## 2022-10-27 NOTE — Discharge Instructions (Signed)
Your evaluation today did not show any evidence of a serious heart rhythm problem.  Please follow-up with your cardiologist to decide whether you should have a longer-term heart monitor.  Return if you have any new or concerning symptoms.  Make sure to go for your hemodialysis today.

## 2022-10-27 NOTE — ED Notes (Signed)
Pt A&ox4 ambulatory at d/c with independent steady gait but pt wheeled out of ED via wheelchair. Pt verbalized understanding of d/c instructions and follow up care.

## 2022-10-30 DIAGNOSIS — Z992 Dependence on renal dialysis: Secondary | ICD-10-CM | POA: Diagnosis not present

## 2022-10-30 DIAGNOSIS — D509 Iron deficiency anemia, unspecified: Secondary | ICD-10-CM | POA: Diagnosis not present

## 2022-10-30 DIAGNOSIS — D631 Anemia in chronic kidney disease: Secondary | ICD-10-CM | POA: Diagnosis not present

## 2022-10-30 DIAGNOSIS — L299 Pruritus, unspecified: Secondary | ICD-10-CM | POA: Diagnosis not present

## 2022-10-30 DIAGNOSIS — N2581 Secondary hyperparathyroidism of renal origin: Secondary | ICD-10-CM | POA: Diagnosis not present

## 2022-10-30 DIAGNOSIS — N186 End stage renal disease: Secondary | ICD-10-CM | POA: Diagnosis not present

## 2022-11-01 ENCOUNTER — Other Ambulatory Visit: Payer: Self-pay

## 2022-11-01 ENCOUNTER — Other Ambulatory Visit: Payer: Self-pay | Admitting: Internal Medicine

## 2022-11-02 DIAGNOSIS — D631 Anemia in chronic kidney disease: Secondary | ICD-10-CM | POA: Diagnosis not present

## 2022-11-02 DIAGNOSIS — L299 Pruritus, unspecified: Secondary | ICD-10-CM | POA: Diagnosis not present

## 2022-11-02 DIAGNOSIS — D509 Iron deficiency anemia, unspecified: Secondary | ICD-10-CM | POA: Diagnosis not present

## 2022-11-02 DIAGNOSIS — N186 End stage renal disease: Secondary | ICD-10-CM | POA: Diagnosis not present

## 2022-11-02 DIAGNOSIS — N2581 Secondary hyperparathyroidism of renal origin: Secondary | ICD-10-CM | POA: Diagnosis not present

## 2022-11-02 DIAGNOSIS — Z992 Dependence on renal dialysis: Secondary | ICD-10-CM | POA: Diagnosis not present

## 2022-11-04 ENCOUNTER — Other Ambulatory Visit: Payer: Self-pay | Admitting: Internal Medicine

## 2022-11-04 DIAGNOSIS — D509 Iron deficiency anemia, unspecified: Secondary | ICD-10-CM | POA: Diagnosis not present

## 2022-11-04 DIAGNOSIS — Z992 Dependence on renal dialysis: Secondary | ICD-10-CM | POA: Diagnosis not present

## 2022-11-04 DIAGNOSIS — N2581 Secondary hyperparathyroidism of renal origin: Secondary | ICD-10-CM | POA: Diagnosis not present

## 2022-11-04 DIAGNOSIS — I129 Hypertensive chronic kidney disease with stage 1 through stage 4 chronic kidney disease, or unspecified chronic kidney disease: Secondary | ICD-10-CM | POA: Diagnosis not present

## 2022-11-04 DIAGNOSIS — D631 Anemia in chronic kidney disease: Secondary | ICD-10-CM | POA: Diagnosis not present

## 2022-11-04 DIAGNOSIS — N186 End stage renal disease: Secondary | ICD-10-CM | POA: Diagnosis not present

## 2022-11-04 DIAGNOSIS — L299 Pruritus, unspecified: Secondary | ICD-10-CM | POA: Diagnosis not present

## 2022-11-04 MED ORDER — BUDESONIDE-FORMOTEROL FUMARATE 80-4.5 MCG/ACT IN AERO: INHALATION_SPRAY | RESPIRATORY_TRACT | 0 refills | Status: DC

## 2022-11-04 NOTE — Telephone Encounter (Signed)
[place] about the request they sent for budesonide-formoterol [facility]) 80-4.5 MCG/ACT inhaler [[id number]]  If need to contact please call [phone] please advise  Has the patient contacted their pharmacy? Yes.   (Agent: If no, request that the patient contact the pharmacy for the refill. If patient does not wish to contact the pharmacy document the reason why and proceed with request.) (Agent: If yes, when and what did the pharmacy advise?)  Preferred Pharmacy (with phone number or street name): [facility]   Has the patient been seen for an appointment in the last year OR does the patient have an upcoming appointment? Yes.    Agent: Please be advised that RX refills may take up to 3 business days. We ask that you follow-up with your pharmacy.

## 2022-11-04 NOTE — Telephone Encounter (Signed)
Requested Prescriptions  Pending Prescriptions Disp Refills   budesonide-formoterol (SYMBICORT) 80-4.5 MCG/ACT inhaler 30.6 g 0    Sig: NEW PRESCRIPTION REQUEST: Symbicort 80-4.5 Mcg- INHALE TWO PUFFS BY MOUTH TWICE DAILY     Pulmonology:  [facility] [employer] Passed - 11/04/2022  3:17 PM      Passed - Valid encounter within last 12 months    Recent Outpatient Visits           2 weeks ago Chronic systolic congestive heart failure, NYHA class 2 ([place])   [place] [name], MD   1 month ago Hospital discharge follow-up   [place], MD   2 months ago Anxiety about health   [facility], Cari S, [place]   4 months ago Essential hypertension   [place], MD   7 months ago Other proteinuria   [place], MD       Future Appointments             In 3 months [name], Dalbert Batman, MD [place]

## 2022-11-05 ENCOUNTER — Encounter: Payer: Self-pay | Admitting: Internal Medicine

## 2022-11-05 ENCOUNTER — Encounter: Payer: Self-pay | Admitting: *Deleted

## 2022-11-05 ENCOUNTER — Ambulatory Visit (HOSPITAL_COMMUNITY)
Admission: RE | Admit: 2022-11-05 | Discharge: 2022-11-05 | Disposition: A | Discharge status: Home / Self Care | Payer: Medicare Other | Source: Ambulatory Visit | Attending: Cardiology | Admitting: Cardiology

## 2022-11-05 ENCOUNTER — Ambulatory Visit (INDEPENDENT_AMBULATORY_CARE_PROVIDER_SITE_OTHER): Payer: Medicare Other | Admitting: Internal Medicine

## 2022-11-05 ENCOUNTER — Encounter (HOSPITAL_COMMUNITY): Payer: Self-pay | Admitting: Cardiology

## 2022-11-05 ENCOUNTER — Other Ambulatory Visit: Payer: Self-pay | Admitting: Internal Medicine

## 2022-11-05 VITALS — BP 108/60 | HR 78 | Wt 250.0 lb

## 2022-11-05 VITALS — BP 100/62 | HR 75 | Ht 67.0 in | Wt 250.0 lb

## 2022-11-05 DIAGNOSIS — I447 Left bundle-branch block, unspecified: Secondary | ICD-10-CM | POA: Diagnosis not present

## 2022-11-05 DIAGNOSIS — Z8674 Personal history of sudden cardiac arrest: Secondary | ICD-10-CM | POA: Insufficient documentation

## 2022-11-05 DIAGNOSIS — Z7901 Long term (current) use of anticoagulants: Secondary | ICD-10-CM | POA: Insufficient documentation

## 2022-11-05 DIAGNOSIS — Z8249 Family history of ischemic heart disease and other diseases of the circulatory system: Secondary | ICD-10-CM | POA: Diagnosis not present

## 2022-11-05 DIAGNOSIS — I420 Dilated cardiomyopathy: Secondary | ICD-10-CM | POA: Diagnosis not present

## 2022-11-05 DIAGNOSIS — I5022 Chronic systolic (congestive) heart failure: Secondary | ICD-10-CM | POA: Diagnosis not present

## 2022-11-05 DIAGNOSIS — I5023 Acute on chronic systolic (congestive) heart failure: Secondary | ICD-10-CM

## 2022-11-05 DIAGNOSIS — Z79899 Other long term (current) drug therapy: Secondary | ICD-10-CM | POA: Diagnosis not present

## 2022-11-05 DIAGNOSIS — I480 Paroxysmal atrial fibrillation: Secondary | ICD-10-CM | POA: Diagnosis not present

## 2022-11-05 DIAGNOSIS — Z8616 Personal history of COVID-19: Secondary | ICD-10-CM | POA: Diagnosis not present

## 2022-11-05 DIAGNOSIS — I132 Hypertensive heart and chronic kidney disease with heart failure and with stage 5 chronic kidney disease, or end stage renal disease: Secondary | ICD-10-CM | POA: Diagnosis not present

## 2022-11-05 DIAGNOSIS — I255 Ischemic cardiomyopathy: Secondary | ICD-10-CM | POA: Diagnosis not present

## 2022-11-05 DIAGNOSIS — Z992 Dependence on renal dialysis: Secondary | ICD-10-CM | POA: Insufficient documentation

## 2022-11-05 DIAGNOSIS — Z006 Encounter for examination for normal comparison and control in clinical research program: Secondary | ICD-10-CM

## 2022-11-05 DIAGNOSIS — Z95810 Presence of automatic (implantable) cardiac defibrillator: Secondary | ICD-10-CM | POA: Insufficient documentation

## 2022-11-05 DIAGNOSIS — N186 End stage renal disease: Secondary | ICD-10-CM | POA: Insufficient documentation

## 2022-11-05 DIAGNOSIS — I472 Ventricular tachycardia, unspecified: Secondary | ICD-10-CM | POA: Diagnosis not present

## 2022-11-05 DIAGNOSIS — I2510 Atherosclerotic heart disease of native coronary artery without angina pectoris: Secondary | ICD-10-CM | POA: Diagnosis not present

## 2022-11-05 MED ORDER — ISOSORB DINITRATE-HYDRALAZINE 20-37.5 MG PO TABS: 0.5000 | ORAL_TABLET | ORAL | 3 refills | Status: DC

## 2022-11-05 MED ORDER — PREDNISONE 10 MG PO TABS: 10.0000 mg | ORAL_TABLET | Freq: Every day | ORAL | 3 refills | Status: DC

## 2022-11-05 NOTE — Research (Signed)
ANALOG Informed Consent   Subject Name: Victor Thompson  Subject met inclusion and exclusion criteria.  The informed consent form, study requirements and expectations were reviewed with the subject and questions and concerns were addressed prior to the signing of the consent form.  The subject verbalized understanding of the trial requirements.  The subject agreed to participate in the ANALOG trial and signed the informed consent at 11:23 a.m. on 11-05-2022  The informed consent was obtained prior to performance of any protocol-specific procedures for the subject.  A copy of the signed informed consent was given to the subject and a copy was placed in the subject's medical record.   Kenya Chalmers, Research Coordinator 11-05-2022  11:23 a.m.     Patient was randomized to study group and during the process of education for the device he did not seem like he really wanted to participate and was willing to do it because his significant other wanted him to. I told the patient research was voluntary and I would feel more comfortable if he wanted to participate and we wold talk about doing this another time.    

## 2022-11-05 NOTE — Patient Instructions (Addendum)
Medication Instructions:  Your physician recommends that you continue on your current medications as directed. Please refer to the Current Medication list given to you today.  *If you need a refill on your cardiac medications before your next appointment, please call your pharmacy*  Lab Work: None ordered.  If you have labs (blood work) drawn today and your tests are completely normal, you will receive your results only by: [place] (if you have MyChart) OR A paper copy in the mail If you have any lab test that is abnormal or we need to change your treatment, we will call you to review the results.  Testing/Procedures: None ordered.  Follow-Up: At [facility], you and your health needs are our priority.  As part of our continuing mission to provide you with exceptional heart care, we have created designated Provider Care Teams.  These Care Teams include your primary Cardiologist (physician) and Advanced Practice Providers (APPs -  Physician Assistants and Nurse Practitioners) who all work together to provide you with the care you need, when you need it.  We recommend signing up for the patient portal called "MyChart".  Sign up information is provided on this After Visit Summary.  MyChart is used to connect with patients for Virtual Visits (Telemedicine).  Patients are able to view lab/test results, encounter notes, upcoming appointments, etc.  Non-urgent messages can be sent to your provider as well.   To learn more about what you can do with MyChart, go to [email or web address].    Your next appointment:   Please schedule a six week follow up appointment with Dr. Cristopher Peru   The format for your next appointment:   In Person  Provider:   Cristopher Peru, MD{or one of the following Advanced Practice Providers on your designated Care Team:   [name], [place] Legrand Como "Jonni Sanger" Chalmers Cater, [place]    Important Information About [place]

## 2022-11-05 NOTE — Patient Instructions (Addendum)
START Bidil (1/2 Tab) every Mon, Wed, Fri and Sun Three times a day ( DO NOT TAKE VIAGRA WITHIN 24 HOURS OF BIDIL )  DECREASE Prednisone to 10 mg daily.  STOP Bactrim.  Your physician recommends that you schedule a follow-up appointment in: 2 months  If you have any questions or concerns before your next appointment please send [place] a message through [place] or call our office at [phone].    TO LEAVE A MESSAGE FOR THE NURSE SELECT OPTION 2, PLEASE LEAVE A MESSAGE INCLUDING: YOUR NAME DATE OF BIRTH CALL BACK NUMBER REASON FOR CALL**this is important as we prioritize the call backs  YOU WILL RECEIVE A CALL BACK THE SAME DAY AS LONG AS YOU CALL BEFORE 4:00 PM  At the [facility], you and your health needs are our priority. As part of our continuing mission to provide you with exceptional heart care, we have created designated Provider Care Teams. These Care Teams include your primary Cardiologist (physician) and Advanced Practice Providers (APPs- Physician Assistants and Nurse Practitioners) who all work together to provide you with the care you need, when you need it.   You may see any of the following providers on your designated Care Team at your next follow up: Dr Glori Bickers Dr Loralie Champagne Dr. Roxana Hires, NP Lyda Jester, [place] [facility] [place], [place] Forestine Na, NP Audry Riles, PharmD   Please be sure to bring in all your medications bottles to every appointment.

## 2022-11-05 NOTE — Progress Notes (Signed)
HPI Mr. Batrez returns today for followup. He is a pleasant 61 yo man with ESRD on HD, non-ischemic CM, HTN and VT. He has an S-ICD in place. He presented with a couple of episodes of VT storm (polymorphic) despite maximal medical therapy and ultimately it was decided to turn off his ICD therapies. He has been on high dose amiodarone. He is pending Pet scanning to rule out cardiac sarcoid which is what his underlying CM is thought to be possibly due to. The patient has had a couple of episodes of palpitations and near syncope. He is tolerating his HD sessions though he has some hypotension.  Allergies  Allergen Reactions   Ace Inhibitors Swelling    Swelling of the tongue   Influenza Vaccines Hives and Swelling    SWELLING REACTION UNSPECIFIED    Ketorolac Swelling    SWELLING REACTION UNSPECIFIED    Lidocaine Swelling    TONGUE SWELLS   Lisinopril Swelling    TONGUE SWELLING Pt reported problem with a BP med which sounded like  lisinopril  But as of 09/19/06,pt had tolerated altace without problem   Penicillins Swelling    TONGUE SWELLS Has patient had a PCN reaction causing immediate rash, facial/tongue/throat swelling, SOB or lightheadedness with hypotension: Yes Has patient had a PCN reaction causing severe rash involving mucus membranes or skin necrosis: No Has patient had a PCN reaction that required hospitalization No Has patient had a PCN reaction occurring within the last 10 years: Yes If all of the above answers are "NO", then may proceed with Cephalosporin use.      Current Outpatient Medications  Medication Sig Dispense Refill   albuterol (PROVENTIL) (2.5 MG/3ML) 0.083% nebulizer solution NEW PRESCRIPTION REQUEST: Albuterol Sul 2.5 Mg/3 Ml- INHALE THE CONTENTS OF 1 VIAL BY MOUTH EVERY 6 HOURS AS NEEDED 675 mL 0   albuterol (VENTOLIN HFA) 108 (90 Base) MCG/ACT inhaler NEW PRESCRIPTION REQUEST: Albuterol Hfa 90 Mcg- INHALE TWO PUFFS BY MOUTH EVERY 4 HOURS AS NEEDED  60.3 g 0   amiodarone (PACERONE) 200 MG tablet NEW PRESCRIPTION REQUEST: Amiodarone Hcl 200 Mg- TAKE TWO TABLETS BY MOUTH TWICE DAILY 360 tablet 3   atorvastatin (LIPITOR) 80 MG tablet Take 1 tablet (80 mg total) by mouth at bedtime. 90 tablet 3   budesonide-formoterol (SYMBICORT) 80-4.5 MCG/ACT inhaler NEW PRESCRIPTION REQUEST: Symbicort 80-4.5 Mcg- INHALE TWO PUFFS BY MOUTH TWICE DAILY 30.6 g 0   calcium acetate (PHOSLO) 667 MG capsule Take 2 capsule by mouth three times a day with meals 180 capsule 6   carvedilol (COREG) 12.5 MG tablet NEW PRESCRIPTION REQUEST: Carvedilol 12.5 Mg- TAKE ONE TABLET BY MOUTH TWICE DAILY 180 tablet 3   cetirizine (ZYRTEC) 10 MG tablet TAKE ONE TABLET BY MOUTH TWICE DAILY 180 tablet 1   Cholecalciferol 125 MCG (5000 UT) TABS TAKE ONE TABLET BY MOUTH DAILY 90 tablet 1   docusate sodium (COLACE) 100 MG capsule NEW PRESCRIPTION REQUEST: Docusate Sodium 100 Mg- TAKE ONE CAPSULE BY MOUTH DAILY 90 capsule 0   ELIQUIS 5 MG TABS tablet NEW PRESCRIPTION REQUEST: Eliquis 5 Mg - TAKE ONE TABLET BY MOUTH TWICE DAILY 180 tablet 3   EPINEPHrine 0.3 mg/0.3 mL IJ SOAJ injection Inject 0.3 mg into the muscle once as needed for up to 2 doses (if worsening tongue swelling, SOB, hypoxia, or other concerns for progressive anaphylaxis). 2 each 2   ethyl chloride spray SPRAY A SMALL AMOUNT THREE TIMES A WEEK JUST PRIOR TO NEEDLE  INSERTION 116 mL 11   fluticasone (FLONASE) 50 MCG/ACT nasal spray NEW PRESCRIPTION REQUEST: Fluticasone Prop 50 Mcg- INSTILL 1 SPRAY IN EACH NOSTRIL DAILY AS NEEDED 47.4 g 0   hydrOXYzine (ATARAX) 25 MG tablet Take 1-2 tablets (25-50 mg total) by mouth at bedtime as needed for anxiety/insomnia. 60 tablet 1   isosorbide-hydrALAZINE (BIDIL) 20-37.5 MG tablet Take 0.5 tablets by mouth as directed. Every Mon, Wed, Fri and Sun Three times a day 45 tablet 3   linaclotide (LINZESS) 72 MCG capsule Take 1 capsule (72 mcg total) by mouth every Monday, Wednesday, Friday, and  Saturday. 30 capsule 1   mexiletine (MEXITIL) 150 MG capsule NEW PRESCRIPTION REQUEST: Mexiletine 150 Mg- TAKE TWO CAPSULES BY MOUTH TWICE DAILY 360 capsule 3   pantoprazole (PROTONIX) 40 MG tablet NEW PRESCRIPTION REQUEST: Pantoprazole Sod Dr 40 Mg - TAKE ONE TABLET BY MOUTH DAILY 90 tablet 3   Polyethylene Glycol 3350 (PEG 3350) 17 g PACK NEW PRESCRIPTION REQUEST: Polyethylene Glycol 3350- 1 PACKET BY MOUTH DAILY AS NEEDED 90 packet 0   predniSONE (DELTASONE) 10 MG tablet Take 1 tablet (10 mg total) by mouth daily with breakfast. 60 tablet 3   sildenafil (VIAGRA) 50 MG tablet Take 50 mg by mouth daily as needed.     VELPHORO 500 MG chewable tablet Chew 1,000 mg by mouth 3 (three) times daily.     No current facility-administered medications for this visit.     Past Medical History:  Diagnosis Date   Acute on chronic systolic CHF (congestive heart failure) ([place]) 06/14/2020   Acute respiratory failure ([place]) 05/28/2022   AICD (automatic cardioverter/defibrillator) present    2015   Allergy    Asthma    no meds   Cardiac arrest [facility]) 2015   Chest pain 05/25/2022   CHF (congestive heart failure) (HCC)    Coronary artery disease    ESRD on hemodialysis (HCC)    ckd -stage 5   Hyperlipidemia    Hypertension    Myocardial infarction ([place])    Obesity    Pneumonia    Shortness of breath dyspnea    Sleep apnea    USES CPAP   Wears glasses     ROS:   All systems reviewed and negative except as noted in the HPI.   Past Surgical History:  Procedure Laterality Date   AV FISTULA PLACEMENT Right 03/15/2019   Procedure: RIGHT ARM ARTERIOVENOUS (AV) FISTULA CREATION;  Surgeon: Angelia Mould, MD;  Location: [place];  Service: Vascular;  Laterality: Right;   COLONOSCOPY     COLONOSCOPY W/ BIOPSIES AND POLYPECTOMY     CORONARY STENT PLACEMENT  2007   IMPLANTABLE CARDIOVERTER DEFIBRILLATOR IMPLANT  2015   RIGHT/LEFT HEART CATH AND CORONARY ANGIOGRAPHY N/A 07/08/2021   Procedure:  RIGHT/LEFT HEART CATH AND CORONARY ANGIOGRAPHY;  Surgeon: Larey Dresser, MD;  Location: [place] [facility];  Service: Cardiovascular;  Laterality: N/A;   RIGHT/LEFT HEART CATH AND CORONARY ANGIOGRAPHY N/A 08/25/2022   Procedure: RIGHT/LEFT HEART CATH AND CORONARY ANGIOGRAPHY;  Surgeon: Larey Dresser, MD;  Location: [place] [facility];  Service: Cardiovascular;  Laterality: N/A;   SUBQ ICD CHANGEOUT N/A 10/13/2020   Procedure: SUBQ ICD CHANGEOUT;  Surgeon: Evans Lance, MD;  Location: [place] [facility];  Service: Cardiovascular;  Laterality: N/A;     Family History  Problem Relation Age of Onset   Hypertension Mother    Liver disease Father    Colon cancer Neg Hx  Esophageal cancer Neg Hx    Rectal cancer Neg Hx    Stomach cancer Neg Hx      Social History   Socioeconomic History   Marital status: Single    Spouse name: Not on file   Number of children: 3   Years of education: Not on file   Highest education level: Not on file  Occupational History   Occupation: retired  Tobacco Use   Smoking status: Never   Smokeless tobacco: Never  Vaping Use   Vaping Use: Never used  Substance and Sexual Activity   Alcohol use: No   Drug use: No   Sexual activity: Yes  Other Topics Concern   Not on file  Social History Narrative   ** Merged History Encounter **       Social Determinants of Health   Financial Resource Strain: Not on file  Food Insecurity: No Food Insecurity (08/28/2022)   Hunger Vital Sign    Worried About Running Out of Food in the Last Year: Never true    Ran Out of Food in the Last Year: Never true  Transportation Needs: No Transportation Needs (09/15/2022)   PRAPARE - Hydrologist (Medical): No    Lack of Transportation (Non-Medical): No  Physical Activity: Insufficiently Active (08/20/2022)   Exercise Vital Sign    Days of Exercise per Week: 1 day    Minutes of Exercise per Session: 10 min  Stress: Not on file   Social Connections: Not on file  Intimate Partner Violence: Not At Risk (08/28/2022)   Humiliation, Afraid, Rape, and Kick questionnaire    Fear of Current or Ex-Partner: No    Emotionally Abused: No    Physically Abused: No    Sexually Abused: No     BP 100/62   Pulse 75   Ht '5\' 7"'$  (1.702 m)   Wt 250 lb (113.4 kg)   SpO2 95%   BMI 39.16 kg/m   Physical Exam:  Well appearing NAD HEENT: Unremarkable Neck:  No JVD, no thyromegally Lymphatics:  No adenopathy Back:  No CVA tenderness Lungs:  Clear with no wheezes HEART:  Regular rate rhythm, no murmurs, no rubs, no clicks Abd:  soft, positive bowel sounds, no organomegally, no rebound, no guarding Ext:  2 plus pulses, no edema, no cyanosis, no clubbing Skin:  No rashes no nodules Neuro:  CN II through XII intact, motor grossly intact   DEVICE  Not checked today. His S-ICD is off.  Assess/Plan: 1. VT storm - he is better on high dose amio and I asked him to decrease his amio to 400/200 mg a.m and p.m. 2. End stage CM - He will follow with Dr. Aundra Dubin. He is on maximal medical therapy.  3. Possible cardiac sarcoid - he awaits a PET scan and is on prednisone. 4. HTN - his bp is now well controlled.   Carleene Overlie Feiga Nadel,MD

## 2022-11-05 NOTE — Telephone Encounter (Signed)
Refused Symbicort inhaler.   Duplicate request

## 2022-11-06 ENCOUNTER — Other Ambulatory Visit: Payer: Self-pay | Admitting: Internal Medicine

## 2022-11-06 DIAGNOSIS — N186 End stage renal disease: Secondary | ICD-10-CM | POA: Diagnosis not present

## 2022-11-06 DIAGNOSIS — Z992 Dependence on renal dialysis: Secondary | ICD-10-CM | POA: Diagnosis not present

## 2022-11-06 DIAGNOSIS — D631 Anemia in chronic kidney disease: Secondary | ICD-10-CM | POA: Diagnosis not present

## 2022-11-06 DIAGNOSIS — D509 Iron deficiency anemia, unspecified: Secondary | ICD-10-CM | POA: Diagnosis not present

## 2022-11-06 DIAGNOSIS — N2581 Secondary hyperparathyroidism of renal origin: Secondary | ICD-10-CM | POA: Diagnosis not present

## 2022-11-07 NOTE — Progress Notes (Signed)
PCP: [name], MD Cardiology: Dr. Radford Pax EP: Dr. Rayann Heman HF cardiology: Dr. Aundra Dubin  61 y.o. with history of CAD, ischemic CMP, and ESRD was referred by Dr. Radford Pax for evaluation of CHF.  He had PCI in [id number], uncertain what vessel was involved.  He has not had coronary angiography since that time due to advanced CKD.  He has had ischemic cardiomyopathy x years.  Echo in 2/22 showed EF 25-30% with moderate LV dilation and normal RV.  He had VT arrest in 2015, has St Jude subcutaneous ICD.  [name] in 2/22.  LHC/RHC was done in 8/22, showing no significant CAD and stable hemodynamics.   Echo 11/22 EF 25-30%, moderate LV dilation, diffuse hypokinesis, normal RV.   Patient had ICD shock in 6/23 while at HD => VT storm.  He was noted to be volume overloaded.  He was started on amiodarone.    Admitted 9/23 with VT storm. Required intubation with multiple amiodarone boluses. Echo showed EF 20-25%, GIIIDD (restrictive), RV normal. R/LHC showed nonobstructive CAD and elevated right and left filling pressures. Underwent CRRT for volume removal. EP consulted and started on procainamide. Started on prednisone for suspected sarcoid. Mexiletine later added for VT suppression. Able to wean pressors off and transition to iHD for volume removal. Decided on DNR, and ICD deactivated.  Hospitalization c/b PNA and paroxysmal atrial fibrillation. Plan to have cardiac PET at [facility] for cardiac sarcoidosis work up.  He returns for followup of CHF and VT.  His ICD remains inactivated.  He has been to the ER a couple of times with palpitations but ECGs have shown NSR. He is taking prednisone for possible sarcoidosis, Dr. Lovena Le recently decreased to 20 mg daily. He still has not heard from [facility] regarding the scheduling of cardiac PET. He is short of breath walking 50-75 yards. He is able to walk up a flight of stairs. No syncope.  Generally no lightheadedness though BP gets low at times at HD.   ECG  (personally reviewed): NSR, LBBB-like IVCD 158 msec  Labs (6/22): LDL 75 Labs (8/22): hgb 13.9 Labs (10/23): K 4.2, mag 2, hgb 10.3 Labs (11/23): hgb 12.9  PMH: 1. CAD: PCI in [id number], uncertain what vessel.  - Cardiolite 2017: Inferior and inferolateral large fixed defect suggestive of prior infarction, EF 28%.  - LHC (8/22): No significant CAD.  2. Chronic systolic CHF: Ischemic cardiomyopathy. St Jude subcutaneous ICD.  - Echo (7/20) with EF 25-30%.  - Echo (10/20) with EF 40-45% - Echo (2/22): EF 25-30%, moderate LV dilation, mild LVH, normal RV size and systolic function, mild MR.  - LHC/RHC (8/22): mean RA 2, PA 24/3, mean PCWP 9, CI 3.13; no significant coronary disease.  - Echo (11/22): EF 25-30%, moderate LV dilation, diffuse hypokinesis, normal RV. - Echo (9/23): EF 20-25%, severe LV dysfunction with global HK, grade III DD, normal RV, mild to moderate MR - R/LHC (9/23): non-obs CAD, elevated R/L pressures with pulmonary venous hypertension. RA mean 14, PA 53/27  (mean 37), PCWP mean 26, CO/CI (Fick) 8.98/3.96, PVR 2.8 WU 3. ESRD since 2/22 4. OSA on CPAP 5. H/o VT arrest: Subcutaneous St Jude ICD placed in 2015 at [facility].  - VT storm 6/23, amiodarone started.  - VT storm 9/23, mexiletine started 6. COVID-19 1/22.  7. HTN 8. Hyperlipidemia 9. Recurrent angioedema.  10. ? Sarcoidosis: Hi-Res chest CT (10/23) with no pulmonary findings suggestive of pulmonary sarcoidosis.  - Start on empiric prednisone. - Cardiac PET  at [facility] ordered  Social History   Socioeconomic History   Marital status: Single    Spouse name: Not on file   Number of children: 3   Years of education: Not on file   Highest education level: Not on file  Occupational History   Occupation: retired  Tobacco Use   Smoking status: Never   Smokeless tobacco: Never  Vaping Use   Vaping Use: Never used  Substance and Sexual Activity   Alcohol use: No   Drug use: No   Sexual activity: Yes  Other Topics  Concern   Not on file  Social History Narrative   ** Merged History Encounter **       Social Determinants of Health   Financial Resource Strain: Not on file  Food Insecurity: No Food Insecurity (08/28/2022)   Hunger Vital Sign    Worried About Running Out of Food in the Last Year: Never true    Ran Out of Food in the Last Year: Never true  Transportation Needs: No Transportation Needs (09/15/2022)   PRAPARE - Hydrologist (Medical): No    Lack of Transportation (Non-Medical): No  Physical Activity: Insufficiently Active (08/20/2022)   Exercise Vital Sign    Days of Exercise per Week: 1 day    Minutes of Exercise per Session: 10 min  Stress: Not on file  Social Connections: Not on file  Intimate Partner Violence: Not At Risk (08/28/2022)   Humiliation, Afraid, Rape, and Kick questionnaire    Fear of Current or Ex-Partner: No    Emotionally Abused: No    Physically Abused: No    Sexually Abused: No   Family History  Problem Relation Age of Onset   Hypertension Mother    Liver disease Father    Colon cancer Neg Hx    Esophageal cancer Neg Hx    Rectal cancer Neg Hx    Stomach cancer Neg Hx    ROS: All systems reviewed and negative except as per HPI.   Current Outpatient Medications  Medication Sig Dispense Refill   albuterol (PROVENTIL) (2.5 MG/3ML) 0.083% nebulizer solution NEW PRESCRIPTION REQUEST: Albuterol Sul 2.5 Mg/3 Ml- INHALE THE CONTENTS OF 1 VIAL BY MOUTH EVERY 6 HOURS AS NEEDED 675 mL 0   albuterol (VENTOLIN HFA) 108 (90 Base) MCG/ACT inhaler NEW PRESCRIPTION REQUEST: Albuterol Hfa 90 Mcg- INHALE TWO PUFFS BY MOUTH EVERY 4 HOURS AS NEEDED 60.3 g 0   amiodarone (PACERONE) 200 MG tablet NEW PRESCRIPTION REQUEST: Amiodarone Hcl 200 Mg- TAKE TWO TABLETS BY MOUTH TWICE DAILY 360 tablet 3   atorvastatin (LIPITOR) 80 MG tablet Take 1 tablet (80 mg total) by mouth at bedtime. 90 tablet 3   budesonide-formoterol (SYMBICORT) 80-4.5 MCG/ACT  inhaler NEW PRESCRIPTION REQUEST: Symbicort 80-4.5 Mcg- INHALE TWO PUFFS BY MOUTH TWICE DAILY 30.6 g 0   calcium acetate (PHOSLO) 667 MG capsule Take 2 capsule by mouth three times a day with meals 180 capsule 6   carvedilol (COREG) 12.5 MG tablet NEW PRESCRIPTION REQUEST: Carvedilol 12.5 Mg- TAKE ONE TABLET BY MOUTH TWICE DAILY 180 tablet 3   cetirizine (ZYRTEC) 10 MG tablet TAKE ONE TABLET BY MOUTH TWICE DAILY 180 tablet 1   Cholecalciferol 125 MCG (5000 UT) TABS TAKE ONE TABLET BY MOUTH DAILY 90 tablet 1   docusate sodium (COLACE) 100 MG capsule NEW PRESCRIPTION REQUEST: Docusate Sodium 100 Mg- TAKE ONE CAPSULE BY MOUTH DAILY 90 capsule 0   ELIQUIS 5 MG TABS tablet NEW PRESCRIPTION  REQUEST: Eliquis 5 Mg - TAKE ONE TABLET BY MOUTH TWICE DAILY 180 tablet 3   EPINEPHrine 0.3 mg/0.3 mL IJ SOAJ injection Inject 0.3 mg into the muscle once as needed for up to 2 doses (if worsening tongue swelling, SOB, hypoxia, or other concerns for progressive anaphylaxis). 2 each 2   ethyl chloride spray SPRAY A SMALL AMOUNT THREE TIMES A WEEK JUST PRIOR TO NEEDLE INSERTION 116 mL 11   fluticasone (FLONASE) 50 MCG/ACT nasal spray NEW PRESCRIPTION REQUEST: Fluticasone Prop 50 Mcg- INSTILL 1 SPRAY IN EACH NOSTRIL DAILY AS NEEDED 47.4 g 0   hydrOXYzine (ATARAX) 25 MG tablet Take 1-2 tablets (25-50 mg total) by mouth at bedtime as needed for anxiety/insomnia. 60 tablet 1   isosorbide-hydrALAZINE (BIDIL) 20-37.5 MG tablet Take 0.5 tablets by mouth as directed. Every Mon, Wed, Fri and Sun Three times a day 45 tablet 3   linaclotide (LINZESS) 72 MCG capsule Take 1 capsule (72 mcg total) by mouth every Monday, Wednesday, Friday, and Saturday. 30 capsule 1   mexiletine (MEXITIL) 150 MG capsule NEW PRESCRIPTION REQUEST: Mexiletine 150 Mg- TAKE TWO CAPSULES BY MOUTH TWICE DAILY 360 capsule 3   pantoprazole (PROTONIX) 40 MG tablet NEW PRESCRIPTION REQUEST: Pantoprazole Sod Dr 40 Mg - TAKE ONE TABLET BY MOUTH DAILY 90 tablet 3    Polyethylene Glycol 3350 (PEG 3350) 17 g PACK NEW PRESCRIPTION REQUEST: Polyethylene Glycol 3350- 1 PACKET BY MOUTH DAILY AS NEEDED 90 packet 0   sildenafil (VIAGRA) 50 MG tablet Take 50 mg by mouth daily as needed.     VELPHORO 500 MG chewable tablet Chew 1,000 mg by mouth 3 (three) times daily.     predniSONE (DELTASONE) 10 MG tablet Take 1 tablet (10 mg total) by mouth daily with breakfast. 60 tablet 3   No current facility-administered medications for this encounter.   Wt Readings from Last 3 Encounters:  11/05/22 113.4 kg (250 lb)  11/05/22 113.4 kg (250 lb)  10/26/22 113.4 kg (250 lb)   BP 108/60   Pulse 78   Wt 113.4 kg (250 lb)   SpO2 94%   BMI 39.16 kg/m  General: NAD Neck: No JVD, no thyromegaly or thyroid nodule.  Lungs: Clear to auscultation bilaterally with normal respiratory effort. CV: Nondisplaced PMI.  Heart regular S1/S2, no S3/S4, no murmur.  No peripheral edema.  No carotid bruit.  Normal pedal pulses.  Abdomen: Soft, nontender, no hepatosplenomegaly, no distention.  Skin: Intact without lesions or rashes.  Neurologic: Alert and oriented x 3.  Psych: Normal affect. Extremities: No clubbing or cyanosis.  HEENT: Normal.   Assessment/Plan: 1. VT storm: Has has a Animator ICD.  Admission in 9/23 with refractory monomorphic VT. Multiple shocks.  Later alternating NSR with a slow wide complex AIVR-like rhythm. Recurrent monomorphic VT that same admission w/ repeated shocks>>re-intubated, rebolused with amio, quinidine discontinued, started on procainamide.  Given concern for possible cardiac sarcoid, started empirically on prednisone. HiRes Chest CT this admit with no evidence of pulmonary sarcoid. Cath this admit with nonobstructive CAD. Echo 9/23 with EF 20-25%, GIIIDD (restrictive), RV normal. Recurrent VT on 09/07/22 with ICD shock.  He is on amiodarone 400 mg daily + mexiletine 300 bid + Coreg 12.5 mg bid.   - EP has decided against VT ablation, do not think it  would be likely to be successful and very high risk.  ICD is now de-activated. He has felt palpitations recently, no syncope. - Continue Coreg 12.5 mg bid.  -  Continue amiodarone 400 mg daily.  Further down-tapering per EP. - Continue mexiletine 300 mg bid.  - Long term very concerning, not candidate for LVAD with ESRD.   - He is on empiric prednisone for possible cardiac sarcoidosis at 20 mg daily.  He still has not had cardiac PET.  I will decrease prednisone to 10 mg daily and he can stop Bactrim prophylaxis.  Will try to get him off prednisone prior cardiac PET to have best chance of detecting active cardiac sarcoidosis.  - Will continue to try to arrange cardiac PET at [facility] to evaluate for cardiac sarcoidosis. - Continue EP followup.  2. Chronic systolic CHF: H/o NICM. Echo (9/23) with EF 20-25%, normal RV function, mild-moderate MR, IVC dilated.  In 9/23, unable to complete cardiac MRI due to artifact from [facility] ICD.  PYP scan was negative.  RHC in 9/23 showed volume overload>>CRRT for volume removal.  Now volume management via iHD. Today, NYHA II-early III, not significantly volume overloaded on exam.  - Continue Coreg 12.5 mg bid.  - Will have him try Bidil 1/2 tid on non-HD days.  - He has a LBBB-like IVCD but has Edgar ICD and questionable benefit to change from [facility] ICD to BiV device in dialysis patient.  3. CAD: ?PCI in 2005, unsure what vessel.  Coronary angiography in 8/22 showed no significant CAD and also does not appear to show prior stent so h/o CAD is questionable. LHC in 9/23 with nonobstructive CAD.  - Continue statin. - No ASA with anticoagulation.  4. ESRD: Since 2/22. TTS HD.  5. Atrial fibrillation: Paroxysmal.  NSR today.  - Remains on amiodarone. Follow LFTs and TSH. He will need a regular eye exam.  - Continue Eliquis. No bleeding issues.  Followup 2 months with APP. Work to expedite PET.   Loralie Champagne  11/07/2022

## 2022-11-08 ENCOUNTER — Other Ambulatory Visit: Payer: Self-pay

## 2022-11-08 MED ORDER — ETHYL CHLORIDE EX AERO
INHALATION_SPRAY | CUTANEOUS | 5 refills | Status: DC
  Filled 2022-11-08: qty 116, 30d supply, fill #0

## 2022-11-08 NOTE — Telephone Encounter (Signed)
Requested Prescriptions  Pending Prescriptions Disp Refills   ethyl chloride spray 116 mL 11    Sig: SPRAY A SMALL AMOUNT THREE TIMES A WEEK JUST PRIOR TO NEEDLE INSERTION     Off-Protocol Failed - 11/06/2022 10:19 AM      Failed - Medication not assigned to a protocol, review manually.      Passed - Valid encounter within last 12 months    Recent Outpatient Visits           2 weeks ago Chronic systolic congestive heart failure, NYHA class 2 ([place])   [place] [name], MD   1 month ago Hospital discharge follow-up   [place], MD   2 months ago Anxiety about health   [facility], Cari S, PA-C   4 months ago Essential hypertension   [place], [place]   7 months ago Other proteinuria   [place], MD       Future Appointments             In 3 months [name] Dalbert Batman, MD [place]

## 2022-11-09 DIAGNOSIS — N2581 Secondary hyperparathyroidism of renal origin: Secondary | ICD-10-CM | POA: Diagnosis not present

## 2022-11-09 DIAGNOSIS — D631 Anemia in chronic kidney disease: Secondary | ICD-10-CM | POA: Diagnosis not present

## 2022-11-09 DIAGNOSIS — D509 Iron deficiency anemia, unspecified: Secondary | ICD-10-CM | POA: Diagnosis not present

## 2022-11-09 DIAGNOSIS — N186 End stage renal disease: Secondary | ICD-10-CM | POA: Diagnosis not present

## 2022-11-09 DIAGNOSIS — Z992 Dependence on renal dialysis: Secondary | ICD-10-CM | POA: Diagnosis not present

## 2022-11-10 ENCOUNTER — Ambulatory Visit: Payer: Self-pay

## 2022-11-10 NOTE — Patient Instructions (Addendum)
Visit Information  Thank you for taking time to visit with me today. Please don't hesitate to contact me if I can be of assistance to you.   Following are the goals we discussed today:   Goals Addressed               This Visit's Progress     Patient Stated     I need help with my medications (pt-stated)        Care Coordination Interventions: Patient interviewed about adult health maintenance status including  improved medication management of polypharmacy Determined patient recently switched to a mail order pharmacy that is he unsatisfied with, he reports having concerns about not receiving his medications on time prior to running out  Determined patient would like assistance with transitioning his medications to a pill package system                Our next appointment is by telephone on 11/25/22 at 215 PM  Please call the care guide team at [phone] if you need to cancel or reschedule your appointment.   If you are experiencing a Mental Health or [place] or need someone to talk to, please call [phone]-TALK (toll free, 24 hour hotline)  Patient verbalizes understanding of instructions and care plan provided today and agrees to view in [place]. Active MyChart status and patient understanding of how to access instructions and care plan via MyChart confirmed with patient.     Barb Merino, RN, BSN, CCM Care Management Coordinator [facility] Care Management Direct Phone: [phone]

## 2022-11-10 NOTE — Patient Outreach (Signed)
  Care Coordination   Follow Up Visit Note   11/10/2022 Name: Victor Thompson MRN: [id number] DOB: 21-May-1961  Victor Thompson is a 61 y.o. year old male who sees Victor Pier, MD for primary care. I spoke with  Victor Thompson by phone today.  What matters to the patients health and wellness today?  Patient would like help with transitioning his medications to a new pharmacy.     Goals Addressed               This Visit's Progress     Patient Stated     I need help with my medications (pt-stated)        Care Coordination Interventions: Patient interviewed about adult health maintenance status including  improved medication management of polypharmacy Determined patient recently switched to a mail order pharmacy that is he unsatisfied with, he reports having concerns about not receiving his medications on time prior to running out  Determined patient would like assistance with transitioning his medications to a pill package system                SDOH assessments and interventions completed:  No     Care Coordination Interventions:  Yes, provided   Follow up plan: Follow up call scheduled for 11/25/22 '@215'$  PM    Encounter Outcome:  Pt. Visit Completed

## 2022-11-11 DIAGNOSIS — N186 End stage renal disease: Secondary | ICD-10-CM | POA: Diagnosis not present

## 2022-11-11 DIAGNOSIS — Z992 Dependence on renal dialysis: Secondary | ICD-10-CM | POA: Diagnosis not present

## 2022-11-11 DIAGNOSIS — N2581 Secondary hyperparathyroidism of renal origin: Secondary | ICD-10-CM | POA: Diagnosis not present

## 2022-11-11 DIAGNOSIS — D509 Iron deficiency anemia, unspecified: Secondary | ICD-10-CM | POA: Diagnosis not present

## 2022-11-11 DIAGNOSIS — D631 Anemia in chronic kidney disease: Secondary | ICD-10-CM | POA: Diagnosis not present

## 2022-11-13 DIAGNOSIS — N186 End stage renal disease: Secondary | ICD-10-CM | POA: Diagnosis not present

## 2022-11-13 DIAGNOSIS — D631 Anemia in chronic kidney disease: Secondary | ICD-10-CM | POA: Diagnosis not present

## 2022-11-13 DIAGNOSIS — D509 Iron deficiency anemia, unspecified: Secondary | ICD-10-CM | POA: Diagnosis not present

## 2022-11-13 DIAGNOSIS — N2581 Secondary hyperparathyroidism of renal origin: Secondary | ICD-10-CM | POA: Diagnosis not present

## 2022-11-13 DIAGNOSIS — Z992 Dependence on renal dialysis: Secondary | ICD-10-CM | POA: Diagnosis not present

## 2022-11-15 ENCOUNTER — Telehealth: Payer: Self-pay | Admitting: *Deleted

## 2022-11-15 MED ORDER — ISOSORB DINITRATE-HYDRALAZINE 20-37.5 MG PO TABS: 0.5000 | ORAL_TABLET | ORAL | 3 refills | Status: DC

## 2022-11-15 NOTE — Telephone Encounter (Signed)
Rx refill sent to pharmacy. 

## 2022-11-16 ENCOUNTER — Telehealth (HOSPITAL_COMMUNITY): Payer: Self-pay | Admitting: *Deleted

## 2022-11-16 DIAGNOSIS — N2581 Secondary hyperparathyroidism of renal origin: Secondary | ICD-10-CM | POA: Diagnosis not present

## 2022-11-16 DIAGNOSIS — Z992 Dependence on renal dialysis: Secondary | ICD-10-CM | POA: Diagnosis not present

## 2022-11-16 DIAGNOSIS — N186 End stage renal disease: Secondary | ICD-10-CM | POA: Diagnosis not present

## 2022-11-16 DIAGNOSIS — D631 Anemia in chronic kidney disease: Secondary | ICD-10-CM | POA: Diagnosis not present

## 2022-11-16 DIAGNOSIS — D509 Iron deficiency anemia, unspecified: Secondary | ICD-10-CM | POA: Diagnosis not present

## 2022-11-18 DIAGNOSIS — N2581 Secondary hyperparathyroidism of renal origin: Secondary | ICD-10-CM | POA: Diagnosis not present

## 2022-11-18 DIAGNOSIS — D631 Anemia in chronic kidney disease: Secondary | ICD-10-CM | POA: Diagnosis not present

## 2022-11-18 DIAGNOSIS — Z992 Dependence on renal dialysis: Secondary | ICD-10-CM | POA: Diagnosis not present

## 2022-11-18 DIAGNOSIS — D509 Iron deficiency anemia, unspecified: Secondary | ICD-10-CM | POA: Diagnosis not present

## 2022-11-18 DIAGNOSIS — N186 End stage renal disease: Secondary | ICD-10-CM | POA: Diagnosis not present

## 2022-11-20 ENCOUNTER — Other Ambulatory Visit: Payer: Self-pay | Admitting: Internal Medicine

## 2022-11-20 DIAGNOSIS — D509 Iron deficiency anemia, unspecified: Secondary | ICD-10-CM | POA: Diagnosis not present

## 2022-11-20 DIAGNOSIS — Z992 Dependence on renal dialysis: Secondary | ICD-10-CM | POA: Diagnosis not present

## 2022-11-20 DIAGNOSIS — N186 End stage renal disease: Secondary | ICD-10-CM | POA: Diagnosis not present

## 2022-11-20 DIAGNOSIS — D631 Anemia in chronic kidney disease: Secondary | ICD-10-CM | POA: Diagnosis not present

## 2022-11-20 DIAGNOSIS — N2581 Secondary hyperparathyroidism of renal origin: Secondary | ICD-10-CM | POA: Diagnosis not present

## 2022-11-23 DIAGNOSIS — D509 Iron deficiency anemia, unspecified: Secondary | ICD-10-CM | POA: Diagnosis not present

## 2022-11-23 DIAGNOSIS — Z992 Dependence on renal dialysis: Secondary | ICD-10-CM | POA: Diagnosis not present

## 2022-11-23 DIAGNOSIS — N2581 Secondary hyperparathyroidism of renal origin: Secondary | ICD-10-CM | POA: Diagnosis not present

## 2022-11-23 DIAGNOSIS — D631 Anemia in chronic kidney disease: Secondary | ICD-10-CM | POA: Diagnosis not present

## 2022-11-23 DIAGNOSIS — N186 End stage renal disease: Secondary | ICD-10-CM | POA: Diagnosis not present

## 2022-11-25 ENCOUNTER — Telehealth (INDEPENDENT_AMBULATORY_CARE_PROVIDER_SITE_OTHER): Payer: Self-pay | Admitting: Emergency Medicine

## 2022-11-25 ENCOUNTER — Ambulatory Visit: Payer: Self-pay

## 2022-11-25 ENCOUNTER — Other Ambulatory Visit: Payer: Self-pay | Admitting: Pharmacist

## 2022-11-25 DIAGNOSIS — N186 End stage renal disease: Secondary | ICD-10-CM | POA: Diagnosis not present

## 2022-11-25 DIAGNOSIS — F418 Other specified anxiety disorders: Secondary | ICD-10-CM

## 2022-11-25 DIAGNOSIS — D631 Anemia in chronic kidney disease: Secondary | ICD-10-CM | POA: Diagnosis not present

## 2022-11-25 DIAGNOSIS — D509 Iron deficiency anemia, unspecified: Secondary | ICD-10-CM | POA: Diagnosis not present

## 2022-11-25 DIAGNOSIS — Z992 Dependence on renal dialysis: Secondary | ICD-10-CM | POA: Diagnosis not present

## 2022-11-25 DIAGNOSIS — N2581 Secondary hyperparathyroidism of renal origin: Secondary | ICD-10-CM | POA: Diagnosis not present

## 2022-11-25 MED ORDER — HYDROXYZINE HCL 25 MG PO TABS: 25.0000 mg | ORAL_TABLET | Freq: Every evening | ORAL | 1 refills | Status: DC | PRN

## 2022-11-25 MED ORDER — LINACLOTIDE 72 MCG PO CAPS: 72.0000 ug | ORAL_CAPSULE | ORAL | 1 refills | Status: DC

## 2022-11-25 MED ORDER — CETIRIZINE HCL 10 MG PO TABS: 10.0000 mg | ORAL_TABLET | Freq: Two times a day (BID) | ORAL | 1 refills | Status: DC

## 2022-11-25 NOTE — Patient Outreach (Signed)
  Care Coordination   Follow Up Visit Note   11/25/2022 Name: Victor Thompson MRN: [id number] DOB: May 08, 1961  Victor Thompson is a 61 y.o. year old male who sees [name], MD for primary care. Provider collaboration with Acquanetta Belling   What matters to the patients health and wellness today?  Patient would like help getting his prescriptions transitioned to a new pharmacy offering pill packaging.     Goals Addressed               This Visit's Progress     Patient Stated     I need help with my medications (pt-stated)        Care Coordination Interventions: Received a message from [facility] team advising they do not assist with medication adherence and pill packaging.  Collaboration with Vertell Novak D with [facility] requesting assistance with helping patient transition to a new local pharmacy who offers pill packaging  Per Lurena Joiner, "[facility] offers pill packing for patients. Some of the medications I cannot rewrite bc they were sent by another MD, but I can resend the ones rewritten by Dr. [name]. [facility] will also need to talk to this patient to set up pill pack delivery, payment, etc."  Sent patient a my chart message notifying him of the next scheduled nurse call set for 11/30/22 '@2'$ :30 PM             SDOH assessments and interventions completed:  No     Care Coordination Interventions:  Yes, provided   Follow up plan: Follow up call scheduled for 11/30/22 '@2'$ :30 PM    Encounter Outcome:  Pt. Visit Completed

## 2022-11-25 NOTE — Telephone Encounter (Signed)
Copied from [place] [id number]. Topic: General - Other >> Nov 25, 2022  2:48 PM Tiffany B wrote: Caller requesting to speak with [facility] regarding patient.

## 2022-11-25 NOTE — Telephone Encounter (Signed)
Per RN, pt would benefit from pill packing. This service is offered locally at [facility]. RN will get back with me if Victor Thompson elects to use this service.

## 2022-11-27 DIAGNOSIS — Z992 Dependence on renal dialysis: Secondary | ICD-10-CM | POA: Diagnosis not present

## 2022-11-27 DIAGNOSIS — N186 End stage renal disease: Secondary | ICD-10-CM | POA: Diagnosis not present

## 2022-11-27 DIAGNOSIS — N2581 Secondary hyperparathyroidism of renal origin: Secondary | ICD-10-CM | POA: Diagnosis not present

## 2022-11-27 DIAGNOSIS — D509 Iron deficiency anemia, unspecified: Secondary | ICD-10-CM | POA: Diagnosis not present

## 2022-11-27 DIAGNOSIS — D631 Anemia in chronic kidney disease: Secondary | ICD-10-CM | POA: Diagnosis not present

## 2022-11-30 ENCOUNTER — Other Ambulatory Visit: Payer: Self-pay | Admitting: Internal Medicine

## 2022-11-30 ENCOUNTER — Ambulatory Visit: Payer: Self-pay

## 2022-11-30 DIAGNOSIS — D631 Anemia in chronic kidney disease: Secondary | ICD-10-CM | POA: Diagnosis not present

## 2022-11-30 DIAGNOSIS — Z992 Dependence on renal dialysis: Secondary | ICD-10-CM | POA: Diagnosis not present

## 2022-11-30 DIAGNOSIS — N186 End stage renal disease: Secondary | ICD-10-CM | POA: Diagnosis not present

## 2022-11-30 DIAGNOSIS — N2581 Secondary hyperparathyroidism of renal origin: Secondary | ICD-10-CM | POA: Diagnosis not present

## 2022-11-30 DIAGNOSIS — D509 Iron deficiency anemia, unspecified: Secondary | ICD-10-CM | POA: Diagnosis not present

## 2022-11-30 NOTE — Telephone Encounter (Signed)
  [facility] [place] - [place], [address]  [place] [address]  Phone: [phone] Fax: [phone]   Ovid Curd from [facility] called requesting budesonide-formoterol [facility]) 80-4.5 MCG/ACT inhaler  Belenda Cruise (Pharmacist in charge) Provider line [phone]

## 2022-11-30 NOTE — Patient Instructions (Signed)
Visit Information  Thank you for taking time to visit with me today. Please don't hesitate to contact me if I can be of assistance to you.   Following are the goals we discussed today:   Goals Addressed               This Visit's Progress     Patient Stated     I need help with my medications (pt-stated)        Care Coordination Interventions: Patient interviewed about adult health maintenance status including  medication adherence  Placed successful outbound call with patient regarding medication adherence and transiting to a new pharmacy Advised patient the [place] [facility] offers pill packaging system and delivery Placed joint call with patient to [place], spoke with the pharmacist to confirm patient's medications may be left in his mailbox if he is not home upon delivery Determined patient would like to proceed with switching to this pharmacy Sent message to Pharmacist Acquanetta Belling Ausdall requesting assistance with patient's transition to the [place] [facility]               Our next appointment is by telephone on 12/15/22 at 11:00 AM  Please call the care guide team at [phone] if you need to cancel or reschedule your appointment.   If you are experiencing a Mental Health or [place] or need someone to talk to, please call 1-800-273-TALK (toll free, 24 hour hotline)  Patient verbalizes understanding of instructions and care plan provided today and agrees to view in [place]. Active MyChart status and patient understanding of how to access instructions and care plan via MyChart confirmed with patient.     Barb Merino, RN, BSN, CCM Care Management Coordinator [facility] Care Management Direct Phone: [phone]

## 2022-11-30 NOTE — Patient Outreach (Signed)
  Care Coordination   Follow Up Visit Note   11/30/2022 Name: Victor Thompson MRN: [id number] DOB: 1961/03/02  Victor Thompson is a 61 y.o. year old male who sees [name], MD for primary care. I spoke with  [name] by phone today.  What matters to the patients health and wellness today?  Patient would like help transitioning to a new pharmacy.     Goals Addressed               This Visit's Progress     Patient Stated     I need help with my medications (pt-stated)        Care Coordination Interventions: Patient interviewed about adult health maintenance status including  medication adherence  Placed successful outbound call with patient regarding medication adherence and transiting to a new pharmacy Advised patient the [place] [facility] offers pill packaging system and delivery Placed joint call with patient to [place], spoke with the pharmacist to confirm patient's medications may be left in his mailbox if he is not home upon delivery Determined patient would like to proceed with switching to this pharmacy Sent message to Pharmacist Acquanetta Belling Ausdall requesting assistance with patient's transition to the [place] [facility]               SDOH assessments and interventions completed:  No     Care Coordination Interventions:  Yes, provided   Follow up plan: Follow up call scheduled for 12/15/22 '@11'$ :00 AM    Encounter Outcome:  Pt. Visit Completed

## 2022-12-01 ENCOUNTER — Other Ambulatory Visit (HOSPITAL_COMMUNITY): Payer: Self-pay | Admitting: *Deleted

## 2022-12-01 ENCOUNTER — Other Ambulatory Visit: Payer: Self-pay | Admitting: Pharmacist

## 2022-12-01 ENCOUNTER — Ambulatory Visit: Payer: Self-pay

## 2022-12-01 DIAGNOSIS — E785 Hyperlipidemia, unspecified: Secondary | ICD-10-CM

## 2022-12-01 DIAGNOSIS — K5903 Drug induced constipation: Secondary | ICD-10-CM

## 2022-12-01 DIAGNOSIS — I2510 Atherosclerotic heart disease of native coronary artery without angina pectoris: Secondary | ICD-10-CM

## 2022-12-01 MED ORDER — ALBUTEROL SULFATE (2.5 MG/3ML) 0.083% IN NEBU: INHALATION_SOLUTION | RESPIRATORY_TRACT | 0 refills | Status: DC

## 2022-12-01 MED ORDER — FLUTICASONE PROPIONATE 50 MCG/ACT NA SUSP: NASAL | 0 refills | Status: DC

## 2022-12-01 MED ORDER — ATORVASTATIN CALCIUM 80 MG PO TABS: 80.0000 mg | ORAL_TABLET | Freq: Every day | ORAL | 3 refills | Status: DC

## 2022-12-01 MED ORDER — ISOSORB DINITRATE-HYDRALAZINE 20-37.5 MG PO TABS: 0.5000 | ORAL_TABLET | ORAL | 3 refills | Status: DC

## 2022-12-01 MED ORDER — BUDESONIDE-FORMOTEROL FUMARATE 80-4.5 MCG/ACT IN AERO: INHALATION_SPRAY | RESPIRATORY_TRACT | 0 refills | Status: DC

## 2022-12-01 MED ORDER — DOCUSATE SODIUM 100 MG PO CAPS: ORAL_CAPSULE | ORAL | 0 refills | Status: DC

## 2022-12-01 MED ORDER — CARVEDILOL 12.5 MG PO TABS: ORAL_TABLET | ORAL | 3 refills | Status: DC

## 2022-12-01 MED ORDER — PANTOPRAZOLE SODIUM 40 MG PO TBEC: DELAYED_RELEASE_TABLET | ORAL | 3 refills | Status: DC

## 2022-12-01 MED ORDER — CHOLECALCIFEROL 125 MCG (5000 UT) PO TABS: 1.0000 | ORAL_TABLET | Freq: Every day | ORAL | 1 refills | Status: DC

## 2022-12-01 MED ORDER — PEG 3350 17 G PO PACK: PACK | ORAL | 0 refills | Status: DC

## 2022-12-01 MED ORDER — ALBUTEROL SULFATE HFA 108 (90 BASE) MCG/ACT IN AERS: INHALATION_SPRAY | RESPIRATORY_TRACT | 0 refills | Status: DC

## 2022-12-01 MED ORDER — APIXABAN 5 MG PO TABS: ORAL_TABLET | ORAL | 3 refills | Status: DC

## 2022-12-01 MED ORDER — EPINEPHRINE 0.3 MG/0.3ML IJ SOAJ: 0.3000 mg | Freq: Once | INTRAMUSCULAR | 2 refills | Status: DC | PRN

## 2022-12-01 MED ORDER — ETHYL CHLORIDE EX AERO
INHALATION_SPRAY | CUTANEOUS | 5 refills | Status: DC
  Filled 2022-12-29: qty 116, 30d supply, fill #0
  Filled 2023-02-10: qty 116, 30d supply, fill #1
  Filled 2023-03-31: qty 116, 30d supply, fill #2
  Filled 2023-04-27: qty 116, 30d supply, fill #3
  Filled 2023-05-26: qty 116, 30d supply, fill #4

## 2022-12-01 MED ORDER — AMIODARONE HCL 200 MG PO TABS: ORAL_TABLET | ORAL | 3 refills | Status: DC

## 2022-12-01 NOTE — Patient Outreach (Addendum)
  Care Coordination   Follow Up Visit Note   12/01/2022 Name: Victor Thompson MRN: [id number] DOB: 04/02/1961  Victor Thompson is a 61 y.o. year old male who sees [name], MD for primary care. I collaborated by telephone with patient's Cardiology office today.   What matters to the patients health and wellness today?  Patient requesting assistance with transitioning his medications to the pill package system with the [facility].     Goals Addressed               This Visit's Progress     Patient Stated     I need help with my medications (pt-stated)        Care Coordination Interventions: Received message from pharmacist Acquanetta Belling Ausdall advising scripts for the following medications have been sent to the [place];  cetirizine, hydroxyzine, and Linzess Placed outbound call to Cardiology, Dr. Aundra Dubin, left a detailed voice message on the triage nurse line requesting new scripts for the following medications be sent to the [place] [facility] located on [address] in [place];  amiodarone, atorvastatin, carvedilol, Eliquis, BiDil, mexiletine, and pantoprazole Collaboration with Benard Halsted, pharmacist regarding additional meds listed in EPIC needing new scripts sent to the [place] outbound call to [facility], spoke with [facility] who advised this RN to contact the [facility] to request new Rx for meds needed  Placed outbound call to [facility] [phone], spoke with [place] [employer] engineer, provided the name/dosage/frequency of the following medications needing new scripts from Dr. Joelyn Oms sent to the [place] [facility] for pill packaging  Sildenafil, Velphoro                SDOH assessments and interventions completed:  No     Care Coordination Interventions:  Yes, provided   Follow up plan: Follow up call scheduled for 12/15/22 '@11'$ :00 AM     Encounter Outcome:  Pt. Visit Completed

## 2022-12-02 DIAGNOSIS — D631 Anemia in chronic kidney disease: Secondary | ICD-10-CM | POA: Diagnosis not present

## 2022-12-02 DIAGNOSIS — N186 End stage renal disease: Secondary | ICD-10-CM | POA: Diagnosis not present

## 2022-12-02 DIAGNOSIS — Z992 Dependence on renal dialysis: Secondary | ICD-10-CM | POA: Diagnosis not present

## 2022-12-02 DIAGNOSIS — N2581 Secondary hyperparathyroidism of renal origin: Secondary | ICD-10-CM | POA: Diagnosis not present

## 2022-12-02 DIAGNOSIS — D509 Iron deficiency anemia, unspecified: Secondary | ICD-10-CM | POA: Diagnosis not present

## 2022-12-04 ENCOUNTER — Other Ambulatory Visit: Payer: Self-pay | Admitting: Internal Medicine

## 2022-12-04 DIAGNOSIS — D631 Anemia in chronic kidney disease: Secondary | ICD-10-CM | POA: Diagnosis not present

## 2022-12-04 DIAGNOSIS — Z992 Dependence on renal dialysis: Secondary | ICD-10-CM | POA: Diagnosis not present

## 2022-12-04 DIAGNOSIS — N186 End stage renal disease: Secondary | ICD-10-CM | POA: Diagnosis not present

## 2022-12-04 DIAGNOSIS — D509 Iron deficiency anemia, unspecified: Secondary | ICD-10-CM | POA: Diagnosis not present

## 2022-12-04 DIAGNOSIS — N2581 Secondary hyperparathyroidism of renal origin: Secondary | ICD-10-CM | POA: Diagnosis not present

## 2022-12-05 DIAGNOSIS — N186 End stage renal disease: Secondary | ICD-10-CM | POA: Diagnosis not present

## 2022-12-05 DIAGNOSIS — Z992 Dependence on renal dialysis: Secondary | ICD-10-CM | POA: Diagnosis not present

## 2022-12-05 DIAGNOSIS — I129 Hypertensive chronic kidney disease with stage 1 through stage 4 chronic kidney disease, or unspecified chronic kidney disease: Secondary | ICD-10-CM | POA: Diagnosis not present

## 2022-12-05 NOTE — Telephone Encounter (Signed)
Change of pharmacy Requested Prescriptions  Pending Prescriptions Disp Refills   budesonide-formoterol (SYMBICORT) 80-4.5 MCG/ACT inhaler [Pharmacy Med Name: Symbicort 80 mcg-4.5 mcg/actuation HFA aerosol inhaler] 10.2 g 2    Sig: INHALE TWO PUFFS INTO THE LUNGS TWICE DAILY (BULK)     Pulmonology:  Combination Products Passed - 12/04/2022  3:17 PM      Passed - Valid encounter within last 12 months    Recent Outpatient Visits           1 month ago Chronic systolic congestive heart failure, NYHA class 2 ([place])   [place] [name], MD   2 months ago Hospital discharge follow-up   [place], [place]   3 months ago Anxiety about health   Primary [facility] at [facility], Cari S, PA-C   5 months ago Essential hypertension   [place], MD   8 months ago Other proteinuria   [place], MD       Future Appointments             In 2 months [name] Dalbert Batman, MD [place]

## 2022-12-07 DIAGNOSIS — Z992 Dependence on renal dialysis: Secondary | ICD-10-CM | POA: Diagnosis not present

## 2022-12-07 DIAGNOSIS — N2581 Secondary hyperparathyroidism of renal origin: Secondary | ICD-10-CM | POA: Diagnosis not present

## 2022-12-07 DIAGNOSIS — D509 Iron deficiency anemia, unspecified: Secondary | ICD-10-CM | POA: Diagnosis not present

## 2022-12-07 DIAGNOSIS — L299 Pruritus, unspecified: Secondary | ICD-10-CM | POA: Diagnosis not present

## 2022-12-07 DIAGNOSIS — D631 Anemia in chronic kidney disease: Secondary | ICD-10-CM | POA: Diagnosis not present

## 2022-12-07 DIAGNOSIS — N186 End stage renal disease: Secondary | ICD-10-CM | POA: Diagnosis not present

## 2022-12-08 ENCOUNTER — Telehealth (HOSPITAL_COMMUNITY): Payer: Self-pay | Admitting: *Deleted

## 2022-12-08 NOTE — Telephone Encounter (Signed)
Auth for pet scan denied. New auth request submitted via [employer]icore and clinical notes faxed. Case #[id number]

## 2022-12-09 ENCOUNTER — Ambulatory Visit: Payer: Medicare Other | Admitting: Internal Medicine

## 2022-12-09 DIAGNOSIS — Z992 Dependence on renal dialysis: Secondary | ICD-10-CM | POA: Diagnosis not present

## 2022-12-09 DIAGNOSIS — D631 Anemia in chronic kidney disease: Secondary | ICD-10-CM | POA: Diagnosis not present

## 2022-12-09 DIAGNOSIS — L299 Pruritus, unspecified: Secondary | ICD-10-CM | POA: Diagnosis not present

## 2022-12-09 DIAGNOSIS — N2581 Secondary hyperparathyroidism of renal origin: Secondary | ICD-10-CM | POA: Diagnosis not present

## 2022-12-09 DIAGNOSIS — N186 End stage renal disease: Secondary | ICD-10-CM | POA: Diagnosis not present

## 2022-12-09 DIAGNOSIS — D509 Iron deficiency anemia, unspecified: Secondary | ICD-10-CM | POA: Diagnosis not present

## 2022-12-11 DIAGNOSIS — N186 End stage renal disease: Secondary | ICD-10-CM | POA: Diagnosis not present

## 2022-12-11 DIAGNOSIS — L299 Pruritus, unspecified: Secondary | ICD-10-CM | POA: Diagnosis not present

## 2022-12-11 DIAGNOSIS — D509 Iron deficiency anemia, unspecified: Secondary | ICD-10-CM | POA: Diagnosis not present

## 2022-12-11 DIAGNOSIS — D631 Anemia in chronic kidney disease: Secondary | ICD-10-CM | POA: Diagnosis not present

## 2022-12-11 DIAGNOSIS — N2581 Secondary hyperparathyroidism of renal origin: Secondary | ICD-10-CM | POA: Diagnosis not present

## 2022-12-11 DIAGNOSIS — Z992 Dependence on renal dialysis: Secondary | ICD-10-CM | POA: Diagnosis not present

## 2022-12-14 ENCOUNTER — Encounter: Payer: Self-pay | Admitting: Internal Medicine

## 2022-12-14 ENCOUNTER — Ambulatory Visit: Payer: Medicare Other | Attending: Internal Medicine | Admitting: Internal Medicine

## 2022-12-14 VITALS — BP 116/70 | HR 72 | Ht 67.0 in | Wt 250.8 lb

## 2022-12-14 DIAGNOSIS — D509 Iron deficiency anemia, unspecified: Secondary | ICD-10-CM | POA: Diagnosis not present

## 2022-12-14 DIAGNOSIS — I472 Ventricular tachycardia, unspecified: Secondary | ICD-10-CM

## 2022-12-14 DIAGNOSIS — I420 Dilated cardiomyopathy: Secondary | ICD-10-CM

## 2022-12-14 DIAGNOSIS — G4733 Obstructive sleep apnea (adult) (pediatric): Secondary | ICD-10-CM | POA: Diagnosis not present

## 2022-12-14 DIAGNOSIS — N186 End stage renal disease: Secondary | ICD-10-CM | POA: Diagnosis not present

## 2022-12-14 DIAGNOSIS — Z992 Dependence on renal dialysis: Secondary | ICD-10-CM | POA: Diagnosis not present

## 2022-12-14 DIAGNOSIS — L299 Pruritus, unspecified: Secondary | ICD-10-CM | POA: Diagnosis not present

## 2022-12-14 DIAGNOSIS — D631 Anemia in chronic kidney disease: Secondary | ICD-10-CM | POA: Diagnosis not present

## 2022-12-14 DIAGNOSIS — Z95810 Presence of automatic (implantable) cardiac defibrillator: Secondary | ICD-10-CM | POA: Diagnosis not present

## 2022-12-14 DIAGNOSIS — N2581 Secondary hyperparathyroidism of renal origin: Secondary | ICD-10-CM | POA: Diagnosis not present

## 2022-12-14 NOTE — Progress Notes (Signed)
HPI [name] returns today for followup. He is a pleasant 62 yo man with ESRD on HD, non-ischemic CM, HTN and VT. He has an S-ICD in place. He presented with a couple of episodes of VT storm (polymorphic) despite maximal medical therapy and ultimately it was decided to turn off his ICD therapies. He has been on high dose amiodarone. He is pending Pet scanning to rule out cardiac sarcoid which is what his underlying CM is thought to be possibly due to. Since I saw him last a couple of months ago, he notes that Dr. Aundra Thompson decreased his amiodarone to 200 mg twice daily and he still has not gotten approval for the PET scan. He feels surprisingly well. Allergies  Allergen Reactions   Ace Inhibitors Swelling    Swelling of the tongue   Influenza Vaccines Hives and Swelling    SWELLING REACTION UNSPECIFIED    Ketorolac Swelling    SWELLING REACTION UNSPECIFIED    Lidocaine Swelling    TONGUE SWELLS   Lisinopril Swelling    TONGUE SWELLING Pt reported problem with a BP med which sounded like  lisinopril  But as of 09/19/06,pt had tolerated altace without problem   Penicillins Swelling    TONGUE SWELLS Has patient had a PCN reaction causing immediate rash, facial/tongue/throat swelling, SOB or lightheadedness with hypotension: Yes Has patient had a PCN reaction causing severe rash involving mucus membranes or skin necrosis: No Has patient had a PCN reaction that required hospitalization No Has patient had a PCN reaction occurring within the last 10 years: Yes If all of the above answers are "NO", then may proceed with Cephalosporin use.      Current Outpatient Medications  Medication Sig Dispense Refill   albuterol (PROVENTIL) (2.5 MG/3ML) 0.083% nebulizer solution INHALE THE CONTENTS OF 1 VIAL BY MOUTH EVERY 6 HOURS AS NEEDED 675 mL 0   albuterol (VENTOLIN HFA) 108 (90 Base) MCG/ACT inhaler NEW PRESCRIPTION REQUEST: Albuterol Hfa 90 Mcg- INHALE TWO PUFFS BY MOUTH EVERY 4 HOURS AS  NEEDED 60.3 g 0   amiodarone (PACERONE) 200 MG tablet NEW PRESCRIPTION REQUEST: Amiodarone Hcl 200 Mg- TAKE TWO TABLETS BY MOUTH TWICE DAILY 360 tablet 3   apixaban (ELIQUIS) 5 MG TABS tablet NEW PRESCRIPTION REQUEST: Eliquis 5 Mg - TAKE ONE TABLET BY MOUTH TWICE DAILY 180 tablet 3   atorvastatin (LIPITOR) 80 MG tablet Take 1 tablet (80 mg total) by mouth at bedtime. 90 tablet 3   budesonide-formoterol (SYMBICORT) 80-4.5 MCG/ACT inhaler INHALE TWO PUFFS INTO THE LUNGS TWICE DAILY (BULK) 10.2 g 2   calcium acetate (PHOSLO) 667 MG capsule Take 2 capsule by mouth three times a day with meals 180 capsule 6   carvedilol (COREG) 12.5 MG tablet NEW PRESCRIPTION REQUEST: Carvedilol 12.5 Mg- TAKE ONE TABLET BY MOUTH TWICE DAILY 180 tablet 3   cetirizine (ZYRTEC) 10 MG tablet Take 1 tablet (10 mg total) by mouth 2 (two) times daily. 180 tablet 1   Cholecalciferol 125 MCG (5000 UT) TABS Take 1 tablet (5,000 Units total) by mouth daily. 90 tablet 1   docusate sodium (COLACE) 100 MG capsule NEW PRESCRIPTION REQUEST: Docusate Sodium 100 Mg- TAKE ONE CAPSULE BY MOUTH DAILY 90 capsule 0   EPINEPHrine 0.3 mg/0.3 mL IJ SOAJ injection Inject 0.3 mg into the muscle once as needed for up to 2 doses (if worsening tongue swelling, SOB, hypoxia, or other concerns for progressive anaphylaxis). 2 each 2   ethyl chloride spray SPRAY A  SMALL AMOUNT THREE TIMES A WEEK JUST PRIOR TO NEEDLE INSERTION 116 mL 5   fluticasone (FLONASE) 50 MCG/ACT nasal spray NEW PRESCRIPTION REQUEST: Fluticasone Prop 50 Mcg- INSTILL 1 SPRAY IN EACH NOSTRIL DAILY AS NEEDED 47.4 g 0   hydrOXYzine (ATARAX) 25 MG tablet Take 1-2 tablets (25-50 mg total) by mouth at bedtime as needed for anxiety/insomnia. 60 tablet 1   isosorbide-hydrALAZINE (BIDIL) 20-37.5 MG tablet Take 0.5 tablets by mouth as directed. Every Mon, Wed, Fri and Sun Three times a day, Do Not Take within 24 hrs of Viagra 45 tablet 3   linaclotide (LINZESS) 72 MCG capsule Take 1 capsule (72  mcg total) by mouth every Monday, Wednesday, Friday, and Saturday. 30 capsule 1   mexiletine (MEXITIL) 150 MG capsule NEW PRESCRIPTION REQUEST: Mexiletine 150 Mg- TAKE TWO CAPSULES BY MOUTH TWICE DAILY 360 capsule 3   pantoprazole (PROTONIX) 40 MG tablet NEW PRESCRIPTION REQUEST: Pantoprazole Sod Dr 40 Mg - TAKE ONE TABLET BY MOUTH DAILY 90 tablet 3   Polyethylene Glycol 3350 (PEG 3350) 17 g PACK NEW PRESCRIPTION REQUEST: Polyethylene Glycol 3350- 1 PACKET BY MOUTH DAILY AS NEEDED 90 packet 0   predniSONE (DELTASONE) 10 MG tablet Take 1 tablet (10 mg total) by mouth daily with breakfast. 60 tablet 3   sildenafil (VIAGRA) 50 MG tablet Take 50 mg by mouth daily as needed.     VELPHORO 500 MG chewable tablet Chew 1,000 mg by mouth 3 (three) times daily.     No current facility-administered medications for this visit.     Past Medical History:  Diagnosis Date   Acute on chronic systolic CHF (congestive heart failure) ([place]) 06/14/2020   Acute respiratory failure ([place]) 05/28/2022   AICD (automatic cardioverter/defibrillator) present    2015   Allergy    Asthma    no meds   Cardiac arrest [facility]) 2015   Chest pain 05/25/2022   CHF (congestive heart failure) (HCC)    Coronary artery disease    ESRD on hemodialysis (HCC)    ckd -stage 5   Hyperlipidemia    Hypertension    Myocardial infarction ([place])    Obesity    Pneumonia    Shortness of breath dyspnea    Sleep apnea    USES CPAP   Wears glasses     ROS:   All systems reviewed and negative except as noted in the HPI.   Past Surgical History:  Procedure Laterality Date   AV FISTULA PLACEMENT Right 03/15/2019   Procedure: RIGHT ARM ARTERIOVENOUS (AV) FISTULA CREATION;  Surgeon: Angelia Mould, MD;  Location: [facility];  Service: Vascular;  Laterality: Right;   COLONOSCOPY     COLONOSCOPY W/ BIOPSIES AND POLYPECTOMY     CORONARY STENT PLACEMENT  2007   IMPLANTABLE CARDIOVERTER DEFIBRILLATOR IMPLANT  2015   RIGHT/LEFT HEART  CATH AND CORONARY ANGIOGRAPHY N/A 07/08/2021   Procedure: RIGHT/LEFT HEART CATH AND CORONARY ANGIOGRAPHY;  Surgeon: Larey Dresser, MD;  Location: [facility];  Service: Cardiovascular;  Laterality: N/A;   RIGHT/LEFT HEART CATH AND CORONARY ANGIOGRAPHY N/A 08/25/2022   Procedure: RIGHT/LEFT HEART CATH AND CORONARY ANGIOGRAPHY;  Surgeon: Larey Dresser, MD;  Location: [facility] CV LAB;  Service: Cardiovascular;  Laterality: N/A;   SUBQ ICD CHANGEOUT N/A 10/13/2020   Procedure: SUBQ ICD CHANGEOUT;  Surgeon: Evans Lance, MD;  Location: [facility] CV [facility];  Service: Cardiovascular;  Laterality: N/A;     Family History  Problem Relation Age of Onset  Hypertension Mother    Liver disease Father    Colon cancer Neg Hx    Esophageal cancer Neg Hx    Rectal cancer Neg Hx    Stomach cancer Neg Hx      Social History   Socioeconomic History   Marital status: Single    Spouse name: Not on file   Number of children: 3   Years of education: Not on file   Highest education level: Not on file  Occupational History   Occupation: retired  Tobacco Use   Smoking status: Never   Smokeless tobacco: Never  Vaping Use   Vaping Use: Never used  Substance and Sexual Activity   Alcohol use: No   Drug use: No   Sexual activity: Yes  Other Topics Concern   Not on file  Social History Narrative   ** Merged History Encounter **       Social Determinants of Health   Financial Resource Strain: Not on file  Food Insecurity: No Food Insecurity (08/28/2022)   Hunger Vital Sign    Worried About Running Out of Food in the Last Year: Never true    Ran Out of Food in the Last Year: Never true  Transportation Needs: No Transportation Needs (09/15/2022)   PRAPARE - Hydrologist (Medical): No    Lack of Transportation (Non-Medical): No  Physical Activity: Insufficiently Active (08/20/2022)   Exercise Vital Sign    Days of Exercise per Week: 1 day    Minutes of  Exercise per Session: 10 min  Stress: Not on file  Social Connections: Not on file  Intimate Partner Violence: Not At Risk (08/28/2022)   Humiliation, Afraid, Rape, and Kick questionnaire    Fear of Current or Ex-Partner: No    Emotionally Abused: No    Physically Abused: No    Sexually Abused: No     BP 116/70   Pulse 72   Ht '5\' 7"'$  (1.702 m)   Wt 250 lb 12.8 oz (113.8 kg)   SpO2 94%   BMI 39.28 kg/m   Physical Exam:  Well appearing NAD HEENT: Unremarkable Neck:  No JVD, no thyromegally Lymphatics:  No adenopathy Back:  No CVA tenderness Lungs:  Clear HEART:  Regular rate rhythm, no murmurs, no rubs, no clicks Abd:  soft, positive bowel sounds, no organomegally, no rebound, no guarding Ext:  2 plus pulses, no edema, no cyanosis, no clubbing Skin:  No rashes no nodules Neuro:  CN II through XII intact, motor grossly intact  EKG - nsr with IVCD  DEVICE  Normal device function.  See PaceArt for details.   Assess/Plan:  1.  VT storm - he is better on high dose amio and I asked him to continue his amio to 200/200 mg a.m and p.m. . 2. End stage CM - He will follow with Dr. Aundra Thompson. He is on maximal medical therapy.  3. Possible cardiac sarcoid - he awaits a PET scan and is on prednisone. 4. HTN - his bp is now well controlled.  Royston Sinner Farra Nikolic,MD

## 2022-12-14 NOTE — Patient Instructions (Addendum)
Medication Instructions:  Your physician recommends that you continue on your current medications as directed. Please refer to the Current Medication list given to you today.  *If you need a refill on your cardiac medications before your next appointment, please call your pharmacy*  Lab Work: None ordered.  If you have labs (blood work) drawn today and your tests are completely normal, you will receive your results only by: [place] (if you have MyChart) OR A paper copy in the mail If you have any lab test that is abnormal or we need to change your treatment, we will call you to review the results.  Testing/Procedures: None ordered.  Follow-Up: At [facility], you and your health needs are our priority.  As part of our continuing mission to provide you with exceptional heart care, we have created designated Provider Care Teams.  These Care Teams include your primary Cardiologist (physician) and Advanced Practice Providers (APPs -  Physician Assistants and Nurse Practitioners) who all work together to provide you with the care you need, when you need it.  We recommend signing up for the patient portal called "MyChart".  Sign up information is provided on this After Visit Summary.  MyChart is used to connect with patients for Virtual Visits (Telemedicine).  Patients are able to view lab/test results, encounter notes, upcoming appointments, etc.  Non-urgent messages can be sent to your provider as well.   To learn more about what you can do with MyChart, go to [email or web address].    Your next appointment:   Please schedule a 6 month follow up appointment with Dr. Cristopher Peru.    The format for your next appointment:   In Person  Provider:   Cristopher Peru, MD{or one of the following Advanced Practice Providers on your designated Care Team:   [name], [place] Legrand Como "Jonni Sanger" Chalmers Cater, [place]  Important Information About [place]

## 2022-12-15 ENCOUNTER — Ambulatory Visit: Payer: Self-pay

## 2022-12-15 ENCOUNTER — Other Ambulatory Visit (HOSPITAL_COMMUNITY): Payer: Self-pay | Admitting: *Deleted

## 2022-12-15 MED ORDER — MEXILETINE HCL 150 MG PO CAPS: ORAL_CAPSULE | ORAL | 3 refills | Status: DC

## 2022-12-15 NOTE — Patient Outreach (Signed)
  Care Coordination   Follow Up Visit Note   12/15/2022 Name: RIHAAN BARRACK MRN: [id number] DOB: 1961/04/05  WILLMAN CUNY is a 62 y.o. year old male who sees [name], MD for primary care. I spoke with  [name] by phone today.  What matters to the patients health and wellness today?  Patient will take his medications into the [facility] to get assistance with bubble packaging.     Goals Addressed               This Visit's Progress     Patient Stated     I need help with my medications (pt-stated)        Care Coordination Interventions: Determined patient is unsure of next steps for completion of transition to [place] [facility] Placed outbound call to [place] [facility], spoke with pharmacist, completed review of patient's medications and determined several medication Rx are needed in order to complete transition to [place] outbound call to Cardiology office for Dr. Aundra Dubin, left a vm for nurse to send in new Rx for Mexiletine, provided the new pharmacy information  Placed outbound call to [facility], spoke with Tomi Bamberger RN, discussed patient needs new Rx sent to new pharmacy for the following medications; Sildenafil, Calcium Acetate, Velphoro, confirmed dosing/frequency, provided the new pharmacy information for the [place] [facility], Tomi Bamberger will call the pharmacy today for verbal orders once verified with provider Informed patient of medication status with the [facility], instructed patient to take his current medications on hand to the [place] as directed by the pharmacist, advised the pharmacist will arrange patient's medications in bubble pack to start using, patient verbalizes understanding  Placed joint call to [facility] (previously used pharmacy) advised patient would like to stop service with this pharmacy           SDOH  assessments and interventions completed:  No     Care Coordination Interventions:  Yes, provided   Follow up plan: Follow up call scheduled for 01/21/23 '@10'$ :30 AM    Encounter Outcome:  Pt. Visit Completed

## 2022-12-15 NOTE — Patient Instructions (Signed)
Visit Information  Thank you for taking time to visit with me today. Please don't hesitate to contact me if I can be of assistance to you.   Following are the goals we discussed today:   Goals Addressed               This Visit's Progress     Patient Stated     I need help with my medications (pt-stated)        Care Coordination Interventions: Determined patient is unsure of next steps for completion of transition to [facility] Placed outbound call to [facility], spoke with pharmacist, completed review of patient's medications and determined several medication Rx are needed in order to complete transition to [place] outbound call to Cardiology office for Dr. Aundra Dubin, left a vm for nurse to send in new Rx for Mexiletine, provided the new pharmacy information  Placed outbound call to [facility], spoke with Tomi Bamberger RN, discussed patient needs new Rx sent to new pharmacy for the following medications; Sildenafil, Calcium Acetate, Velphoro, confirmed dosing/frequency, provided the new pharmacy information for the [facility], Tomi Bamberger will call the pharmacy today for verbal orders once verified with provider Informed patient of medication status with the [facility], instructed patient to take his current medications on hand to the [facility] as directed by the pharmacist, advised the pharmacist will arrange patient's medications in bubble pack to start using, patient verbalizes understanding  Placed joint call to [facility] (previously used pharmacy) advised patient would like to stop service with this pharmacy           Our next appointment is by telephone on 01/21/23 at 10:30 AM  Please call the care guide team at [phone] if you need to cancel or reschedule your appointment.   If you are experiencing a Mental Health or [place] or need someone to talk  to, please go to [facility] [address] [place] [phone])  Patient verbalizes understanding of instructions and care plan provided today and agrees to view in [place]. Active MyChart status and patient understanding of how to access instructions and care plan via MyChart confirmed with patient.     Barb Merino, RN, BSN, CCM Care Management Coordinator [facility] Care Management Direct Phone: [phone]

## 2022-12-16 DIAGNOSIS — D509 Iron deficiency anemia, unspecified: Secondary | ICD-10-CM | POA: Diagnosis not present

## 2022-12-16 DIAGNOSIS — Z992 Dependence on renal dialysis: Secondary | ICD-10-CM | POA: Diagnosis not present

## 2022-12-16 DIAGNOSIS — L299 Pruritus, unspecified: Secondary | ICD-10-CM | POA: Diagnosis not present

## 2022-12-16 DIAGNOSIS — D631 Anemia in chronic kidney disease: Secondary | ICD-10-CM | POA: Diagnosis not present

## 2022-12-16 DIAGNOSIS — N186 End stage renal disease: Secondary | ICD-10-CM | POA: Diagnosis not present

## 2022-12-16 DIAGNOSIS — N2581 Secondary hyperparathyroidism of renal origin: Secondary | ICD-10-CM | POA: Diagnosis not present

## 2022-12-18 DIAGNOSIS — D631 Anemia in chronic kidney disease: Secondary | ICD-10-CM | POA: Diagnosis not present

## 2022-12-18 DIAGNOSIS — N186 End stage renal disease: Secondary | ICD-10-CM | POA: Diagnosis not present

## 2022-12-18 DIAGNOSIS — Z992 Dependence on renal dialysis: Secondary | ICD-10-CM | POA: Diagnosis not present

## 2022-12-18 DIAGNOSIS — D509 Iron deficiency anemia, unspecified: Secondary | ICD-10-CM | POA: Diagnosis not present

## 2022-12-18 DIAGNOSIS — N2581 Secondary hyperparathyroidism of renal origin: Secondary | ICD-10-CM | POA: Diagnosis not present

## 2022-12-18 DIAGNOSIS — L299 Pruritus, unspecified: Secondary | ICD-10-CM | POA: Diagnosis not present

## 2022-12-21 DIAGNOSIS — N186 End stage renal disease: Secondary | ICD-10-CM | POA: Diagnosis not present

## 2022-12-21 DIAGNOSIS — L299 Pruritus, unspecified: Secondary | ICD-10-CM | POA: Diagnosis not present

## 2022-12-21 DIAGNOSIS — Z992 Dependence on renal dialysis: Secondary | ICD-10-CM | POA: Diagnosis not present

## 2022-12-21 DIAGNOSIS — D631 Anemia in chronic kidney disease: Secondary | ICD-10-CM | POA: Diagnosis not present

## 2022-12-21 DIAGNOSIS — D509 Iron deficiency anemia, unspecified: Secondary | ICD-10-CM | POA: Diagnosis not present

## 2022-12-21 DIAGNOSIS — J449 Chronic obstructive pulmonary disease, unspecified: Secondary | ICD-10-CM | POA: Diagnosis not present

## 2022-12-21 DIAGNOSIS — N2581 Secondary hyperparathyroidism of renal origin: Secondary | ICD-10-CM | POA: Diagnosis not present

## 2022-12-21 DIAGNOSIS — G4733 Obstructive sleep apnea (adult) (pediatric): Secondary | ICD-10-CM | POA: Diagnosis not present

## 2022-12-22 DIAGNOSIS — G4733 Obstructive sleep apnea (adult) (pediatric): Secondary | ICD-10-CM | POA: Diagnosis not present

## 2022-12-23 DIAGNOSIS — L299 Pruritus, unspecified: Secondary | ICD-10-CM | POA: Diagnosis not present

## 2022-12-23 DIAGNOSIS — Z992 Dependence on renal dialysis: Secondary | ICD-10-CM | POA: Diagnosis not present

## 2022-12-23 DIAGNOSIS — D631 Anemia in chronic kidney disease: Secondary | ICD-10-CM | POA: Diagnosis not present

## 2022-12-23 DIAGNOSIS — N186 End stage renal disease: Secondary | ICD-10-CM | POA: Diagnosis not present

## 2022-12-23 DIAGNOSIS — N2581 Secondary hyperparathyroidism of renal origin: Secondary | ICD-10-CM | POA: Diagnosis not present

## 2022-12-23 DIAGNOSIS — D509 Iron deficiency anemia, unspecified: Secondary | ICD-10-CM | POA: Diagnosis not present

## 2022-12-24 ENCOUNTER — Other Ambulatory Visit: Payer: Self-pay | Admitting: Internal Medicine

## 2022-12-24 NOTE — Telephone Encounter (Signed)
Requested Prescriptions  Pending Prescriptions Disp Refills   fluticasone (FLONASE) 50 MCG/ACT nasal spray [Pharmacy Med Name: fluticasone propionate 50 mcg/actuation nasal spray,suspension] 47.4 mL 1    Sig: INSTILL 1 SPRAY IN EACH NOSTRIL DAILY AS NEEDED     Ear, Nose, and Throat: Nasal Preparations - Corticosteroids Passed - 12/24/2022  8:03 AM      Passed - Valid encounter within last 12 months    Recent Outpatient Visits           2 months ago Chronic systolic congestive heart failure, NYHA class 2 ([place])   [place] [name], MD   3 months ago Hospital discharge follow-up   [place], MD   4 months ago Anxiety about health   [facility] at [facility], [place], [place]   6 months ago Essential hypertension   [place], MD   9 months ago Other proteinuria   [place], MD       Future Appointments             In 1 month [name], Dalbert Batman, MD [place]   In 2 months Young, Kasandra Knudsen, MD [facility] Care at [facility]

## 2022-12-25 DIAGNOSIS — N2581 Secondary hyperparathyroidism of renal origin: Secondary | ICD-10-CM | POA: Diagnosis not present

## 2022-12-25 DIAGNOSIS — N186 End stage renal disease: Secondary | ICD-10-CM | POA: Diagnosis not present

## 2022-12-25 DIAGNOSIS — Z992 Dependence on renal dialysis: Secondary | ICD-10-CM | POA: Diagnosis not present

## 2022-12-25 DIAGNOSIS — L299 Pruritus, unspecified: Secondary | ICD-10-CM | POA: Diagnosis not present

## 2022-12-25 DIAGNOSIS — D509 Iron deficiency anemia, unspecified: Secondary | ICD-10-CM | POA: Diagnosis not present

## 2022-12-25 DIAGNOSIS — D631 Anemia in chronic kidney disease: Secondary | ICD-10-CM | POA: Diagnosis not present

## 2022-12-28 ENCOUNTER — Other Ambulatory Visit: Payer: Self-pay

## 2022-12-28 DIAGNOSIS — D509 Iron deficiency anemia, unspecified: Secondary | ICD-10-CM | POA: Diagnosis not present

## 2022-12-28 DIAGNOSIS — Z992 Dependence on renal dialysis: Secondary | ICD-10-CM | POA: Diagnosis not present

## 2022-12-28 DIAGNOSIS — N186 End stage renal disease: Secondary | ICD-10-CM | POA: Diagnosis not present

## 2022-12-28 DIAGNOSIS — D631 Anemia in chronic kidney disease: Secondary | ICD-10-CM | POA: Diagnosis not present

## 2022-12-28 DIAGNOSIS — L299 Pruritus, unspecified: Secondary | ICD-10-CM | POA: Diagnosis not present

## 2022-12-28 DIAGNOSIS — N2581 Secondary hyperparathyroidism of renal origin: Secondary | ICD-10-CM | POA: Diagnosis not present

## 2022-12-29 ENCOUNTER — Other Ambulatory Visit: Payer: Self-pay

## 2022-12-30 DIAGNOSIS — D509 Iron deficiency anemia, unspecified: Secondary | ICD-10-CM | POA: Diagnosis not present

## 2022-12-30 DIAGNOSIS — Z992 Dependence on renal dialysis: Secondary | ICD-10-CM | POA: Diagnosis not present

## 2022-12-30 DIAGNOSIS — N186 End stage renal disease: Secondary | ICD-10-CM | POA: Diagnosis not present

## 2022-12-30 DIAGNOSIS — D631 Anemia in chronic kidney disease: Secondary | ICD-10-CM | POA: Diagnosis not present

## 2022-12-30 DIAGNOSIS — N2581 Secondary hyperparathyroidism of renal origin: Secondary | ICD-10-CM | POA: Diagnosis not present

## 2022-12-30 DIAGNOSIS — L299 Pruritus, unspecified: Secondary | ICD-10-CM | POA: Diagnosis not present

## 2023-01-01 DIAGNOSIS — N186 End stage renal disease: Secondary | ICD-10-CM | POA: Diagnosis not present

## 2023-01-01 DIAGNOSIS — L299 Pruritus, unspecified: Secondary | ICD-10-CM | POA: Diagnosis not present

## 2023-01-01 DIAGNOSIS — N2581 Secondary hyperparathyroidism of renal origin: Secondary | ICD-10-CM | POA: Diagnosis not present

## 2023-01-01 DIAGNOSIS — D631 Anemia in chronic kidney disease: Secondary | ICD-10-CM | POA: Diagnosis not present

## 2023-01-01 DIAGNOSIS — Z992 Dependence on renal dialysis: Secondary | ICD-10-CM | POA: Diagnosis not present

## 2023-01-01 DIAGNOSIS — D509 Iron deficiency anemia, unspecified: Secondary | ICD-10-CM | POA: Diagnosis not present

## 2023-01-04 DIAGNOSIS — L299 Pruritus, unspecified: Secondary | ICD-10-CM | POA: Diagnosis not present

## 2023-01-04 DIAGNOSIS — D631 Anemia in chronic kidney disease: Secondary | ICD-10-CM | POA: Diagnosis not present

## 2023-01-04 DIAGNOSIS — Z992 Dependence on renal dialysis: Secondary | ICD-10-CM | POA: Diagnosis not present

## 2023-01-04 DIAGNOSIS — N186 End stage renal disease: Secondary | ICD-10-CM | POA: Diagnosis not present

## 2023-01-04 DIAGNOSIS — N2581 Secondary hyperparathyroidism of renal origin: Secondary | ICD-10-CM | POA: Diagnosis not present

## 2023-01-04 DIAGNOSIS — D509 Iron deficiency anemia, unspecified: Secondary | ICD-10-CM | POA: Diagnosis not present

## 2023-01-04 NOTE — Telephone Encounter (Signed)
Called and spoke to patient.  He confirmed appointment on Tuesday, 3-12 at 1:40pm.  Patient was given his MyChart user name to access MyChart to check appointment

## 2023-01-05 DIAGNOSIS — N186 End stage renal disease: Secondary | ICD-10-CM | POA: Diagnosis not present

## 2023-01-05 DIAGNOSIS — Z992 Dependence on renal dialysis: Secondary | ICD-10-CM | POA: Diagnosis not present

## 2023-01-05 DIAGNOSIS — I129 Hypertensive chronic kidney disease with stage 1 through stage 4 chronic kidney disease, or unspecified chronic kidney disease: Secondary | ICD-10-CM | POA: Diagnosis not present

## 2023-01-06 DIAGNOSIS — L299 Pruritus, unspecified: Secondary | ICD-10-CM | POA: Diagnosis not present

## 2023-01-06 DIAGNOSIS — D509 Iron deficiency anemia, unspecified: Secondary | ICD-10-CM | POA: Diagnosis not present

## 2023-01-06 DIAGNOSIS — N186 End stage renal disease: Secondary | ICD-10-CM | POA: Diagnosis not present

## 2023-01-06 DIAGNOSIS — D631 Anemia in chronic kidney disease: Secondary | ICD-10-CM | POA: Diagnosis not present

## 2023-01-06 DIAGNOSIS — Z992 Dependence on renal dialysis: Secondary | ICD-10-CM | POA: Diagnosis not present

## 2023-01-06 DIAGNOSIS — N2581 Secondary hyperparathyroidism of renal origin: Secondary | ICD-10-CM | POA: Diagnosis not present

## 2023-01-06 DIAGNOSIS — T7840XA Allergy, unspecified, initial encounter: Secondary | ICD-10-CM | POA: Diagnosis not present

## 2023-01-07 DIAGNOSIS — I871 Compression of vein: Secondary | ICD-10-CM | POA: Diagnosis not present

## 2023-01-07 DIAGNOSIS — Z992 Dependence on renal dialysis: Secondary | ICD-10-CM | POA: Diagnosis not present

## 2023-01-07 DIAGNOSIS — N186 End stage renal disease: Secondary | ICD-10-CM | POA: Diagnosis not present

## 2023-01-08 DIAGNOSIS — D631 Anemia in chronic kidney disease: Secondary | ICD-10-CM | POA: Diagnosis not present

## 2023-01-08 DIAGNOSIS — T7840XA Allergy, unspecified, initial encounter: Secondary | ICD-10-CM | POA: Diagnosis not present

## 2023-01-08 DIAGNOSIS — Z992 Dependence on renal dialysis: Secondary | ICD-10-CM | POA: Diagnosis not present

## 2023-01-08 DIAGNOSIS — D509 Iron deficiency anemia, unspecified: Secondary | ICD-10-CM | POA: Diagnosis not present

## 2023-01-08 DIAGNOSIS — N2581 Secondary hyperparathyroidism of renal origin: Secondary | ICD-10-CM | POA: Diagnosis not present

## 2023-01-08 DIAGNOSIS — L299 Pruritus, unspecified: Secondary | ICD-10-CM | POA: Diagnosis not present

## 2023-01-08 DIAGNOSIS — N186 End stage renal disease: Secondary | ICD-10-CM | POA: Diagnosis not present

## 2023-01-10 ENCOUNTER — Other Ambulatory Visit: Payer: Self-pay

## 2023-01-10 ENCOUNTER — Telehealth: Payer: Self-pay

## 2023-01-10 ENCOUNTER — Other Ambulatory Visit (HOSPITAL_COMMUNITY): Payer: Self-pay | Admitting: Cardiology

## 2023-01-10 ENCOUNTER — Other Ambulatory Visit: Payer: Self-pay | Admitting: Internal Medicine

## 2023-01-10 DIAGNOSIS — F418 Other specified anxiety disorders: Secondary | ICD-10-CM

## 2023-01-10 MED ORDER — ALBUTEROL SULFATE HFA 108 (90 BASE) MCG/ACT IN AERS: INHALATION_SPRAY | RESPIRATORY_TRACT | 6 refills | Status: DC

## 2023-01-10 MED ORDER — BUDESONIDE-FORMOTEROL FUMARATE 80-4.5 MCG/ACT IN AERO: INHALATION_SPRAY | RESPIRATORY_TRACT | 6 refills | Status: DC

## 2023-01-10 MED ORDER — HYDROXYZINE HCL 25 MG PO TABS: 25.0000 mg | ORAL_TABLET | Freq: Every evening | ORAL | 1 refills | Status: DC | PRN

## 2023-01-10 NOTE — Telephone Encounter (Signed)
I met with the patient when he came into the clinic this morning.  He was upset because he said he needed refills of all of his medications.  He went to [facility] today and was told by the pharmacist that he did not have orders for all of the medications. The patient said that he requested refills 3 weeks ago and was upset that the medications were not ready today. He said that he gets some of his medications from [facility] in [place] and he thinks he should have all of the medications transferred to [facility].  I told him that I would call [facility] to check on the status of the refills and see if he can get all of the meds today.  I spoke to Christy Sartorius, [place] and he said that the patient has prescriptions filled at 3 different pharmacies which is creating confusion. Christy Sartorius has been bubble packing the meds which the patient appreciates.  Christy Sartorius said that he still needs orders for 2 medications - Prednisone and Rena-vite.  I told him that Dr [name] did not fill those., Dr Aundra Dubin ordered the prednisone and Christy Sartorius said he would contact Dr Deatra Robinson for an order.  I do not have the name of the nephrologist but Christy Sartorius said he will check with the patient and then request the order for the rena-vite.  In the meantime, Christy Sartorius said he would fill all prescriptions, including the prednisone and rena-vite for a week so the patient will not miss any medications, while he obtains all orders and transfers prescriptions to [facility].  He said that all of the medications would be ready today in about 20 minutes - this was before 1230 when I spoke to [place].  I then met with the patient again and explained my conversation with Christy Sartorius.  He was still upset that all of the medications were not ready this morning. I told him that it is important that he does not miss taking any medication and [facility] will have them all ready for him in about 20 minutes  I explained that using 3 pharmacies can cause  confusion and he should try to keep all of the medications at one pharmacy.  He needs the medications today and should get them at [facility]. If he transfers everything to [facility] now, he will not have meds by tonight. I told him that he can still change pharmacies but he should get the meds from [facility] now and decide at a later time if he wants to switch. He then said he would give [facility] a chance and would get the meds today.

## 2023-01-11 ENCOUNTER — Other Ambulatory Visit: Payer: Self-pay | Admitting: Internal Medicine

## 2023-01-11 DIAGNOSIS — D509 Iron deficiency anemia, unspecified: Secondary | ICD-10-CM | POA: Diagnosis not present

## 2023-01-11 DIAGNOSIS — N186 End stage renal disease: Secondary | ICD-10-CM | POA: Diagnosis not present

## 2023-01-11 DIAGNOSIS — T7840XA Allergy, unspecified, initial encounter: Secondary | ICD-10-CM | POA: Diagnosis not present

## 2023-01-11 DIAGNOSIS — N2581 Secondary hyperparathyroidism of renal origin: Secondary | ICD-10-CM | POA: Diagnosis not present

## 2023-01-11 DIAGNOSIS — K5903 Drug induced constipation: Secondary | ICD-10-CM

## 2023-01-11 DIAGNOSIS — D631 Anemia in chronic kidney disease: Secondary | ICD-10-CM | POA: Diagnosis not present

## 2023-01-11 DIAGNOSIS — L299 Pruritus, unspecified: Secondary | ICD-10-CM | POA: Diagnosis not present

## 2023-01-11 DIAGNOSIS — Z992 Dependence on renal dialysis: Secondary | ICD-10-CM | POA: Diagnosis not present

## 2023-01-11 NOTE — Telephone Encounter (Signed)
Last RF 11/25/22 #30 1 RF  Requested Prescriptions  Refused Prescriptions Disp Refills   LINZESS 72 MCG capsule [Pharmacy Med Name: LINZESS 72 MCG ORAL CAPSULE] 30 capsule 1    Sig: TAKE 1 CAPSULE (72 MCG TOTAL) BY MOUTH EVERY MONDAY, [place], [place], AND SATURDAY.     Gastroenterology: Irritable Bowel Syndrome Passed - 01/10/2023 11:49 AM      Passed - Valid encounter within last 12 months    Recent Outpatient Visits           2 months ago Chronic systolic congestive heart failure, NYHA class 2 ([place])   [place] [name], MD   3 months ago Hospital discharge follow-up   [place], MD   4 months ago Anxiety about health   [place] [facility] at [facility], [place], [place]   6 months ago Essential hypertension   [place], MD   9 months ago Other proteinuria   [place], MD       Future Appointments             In 1 month [name] Dalbert Batman, MD [place]   In 1 month Young, Kasandra Knudsen, MD [facility] Care at [facility]

## 2023-01-12 ENCOUNTER — Other Ambulatory Visit (HOSPITAL_COMMUNITY): Payer: Self-pay | Admitting: *Deleted

## 2023-01-12 MED ORDER — AMIODARONE HCL 200 MG PO TABS: 200.0000 mg | ORAL_TABLET | Freq: Every day | ORAL | 3 refills | Status: DC

## 2023-01-13 ENCOUNTER — Other Ambulatory Visit (HOSPITAL_COMMUNITY): Payer: Self-pay

## 2023-01-13 DIAGNOSIS — N2581 Secondary hyperparathyroidism of renal origin: Secondary | ICD-10-CM | POA: Diagnosis not present

## 2023-01-13 DIAGNOSIS — T7840XA Allergy, unspecified, initial encounter: Secondary | ICD-10-CM | POA: Diagnosis not present

## 2023-01-13 DIAGNOSIS — N186 End stage renal disease: Secondary | ICD-10-CM | POA: Diagnosis not present

## 2023-01-13 DIAGNOSIS — L299 Pruritus, unspecified: Secondary | ICD-10-CM | POA: Diagnosis not present

## 2023-01-13 DIAGNOSIS — Z992 Dependence on renal dialysis: Secondary | ICD-10-CM | POA: Diagnosis not present

## 2023-01-13 DIAGNOSIS — D631 Anemia in chronic kidney disease: Secondary | ICD-10-CM | POA: Diagnosis not present

## 2023-01-13 DIAGNOSIS — D509 Iron deficiency anemia, unspecified: Secondary | ICD-10-CM | POA: Diagnosis not present

## 2023-01-13 MED ORDER — AMIODARONE HCL 200 MG PO TABS: 200.0000 mg | ORAL_TABLET | Freq: Two times a day (BID) | ORAL | 2 refills | Status: DC

## 2023-01-15 ENCOUNTER — Other Ambulatory Visit: Payer: Self-pay | Admitting: Internal Medicine

## 2023-01-15 DIAGNOSIS — N2581 Secondary hyperparathyroidism of renal origin: Secondary | ICD-10-CM | POA: Diagnosis not present

## 2023-01-15 DIAGNOSIS — D509 Iron deficiency anemia, unspecified: Secondary | ICD-10-CM | POA: Diagnosis not present

## 2023-01-15 DIAGNOSIS — N186 End stage renal disease: Secondary | ICD-10-CM | POA: Diagnosis not present

## 2023-01-15 DIAGNOSIS — T7840XA Allergy, unspecified, initial encounter: Secondary | ICD-10-CM | POA: Diagnosis not present

## 2023-01-15 DIAGNOSIS — Z992 Dependence on renal dialysis: Secondary | ICD-10-CM | POA: Diagnosis not present

## 2023-01-15 DIAGNOSIS — D631 Anemia in chronic kidney disease: Secondary | ICD-10-CM | POA: Diagnosis not present

## 2023-01-15 DIAGNOSIS — L299 Pruritus, unspecified: Secondary | ICD-10-CM | POA: Diagnosis not present

## 2023-01-18 DIAGNOSIS — N186 End stage renal disease: Secondary | ICD-10-CM | POA: Diagnosis not present

## 2023-01-18 DIAGNOSIS — D509 Iron deficiency anemia, unspecified: Secondary | ICD-10-CM | POA: Diagnosis not present

## 2023-01-18 DIAGNOSIS — Z992 Dependence on renal dialysis: Secondary | ICD-10-CM | POA: Diagnosis not present

## 2023-01-18 DIAGNOSIS — T7840XA Allergy, unspecified, initial encounter: Secondary | ICD-10-CM | POA: Diagnosis not present

## 2023-01-18 DIAGNOSIS — N2581 Secondary hyperparathyroidism of renal origin: Secondary | ICD-10-CM | POA: Diagnosis not present

## 2023-01-18 DIAGNOSIS — D631 Anemia in chronic kidney disease: Secondary | ICD-10-CM | POA: Diagnosis not present

## 2023-01-18 DIAGNOSIS — L299 Pruritus, unspecified: Secondary | ICD-10-CM | POA: Diagnosis not present

## 2023-01-18 NOTE — Progress Notes (Signed)
PCP: [name], MD Cardiology: Dr. Radford Pax EP: Dr. Rayann Heman HF cardiology: Dr. Aundra Dubin  62 y.o. with history of CAD, ischemic CMP, and ESRD was referred by Dr. Radford Pax for evaluation of CHF.  He had PCI in [id number], uncertain what vessel was involved.  He has not had coronary angiography since that time due to advanced CKD.  He has had ischemic cardiomyopathy x years.  Echo in 2/22 showed EF 25-30% with moderate LV dilation and normal RV.  He had VT arrest in 2015, has St Jude subcutaneous ICD.  [name] in 2/22.  LHC/RHC was done in 8/22, showing no significant CAD and stable hemodynamics.   Echo 11/22 EF 25-30%, moderate LV dilation, diffuse hypokinesis, normal RV.   Patient had ICD shock in 6/23 while at HD => VT storm.  He was noted to be volume overloaded.  He was started on amiodarone.    Admitted 9/23 with VT storm. Required intubation with multiple amiodarone boluses. Echo showed EF 20-25%, GIIIDD (restrictive), RV normal. R/LHC showed nonobstructive CAD and elevated right and left filling pressures. Underwent CRRT for volume removal. EP consulted and started on procainamide. Started on prednisone for suspected sarcoid. Mexiletine later added for VT suppression. Able to wean pressors off and transition to iHD for volume removal. Decided on DNR, and ICD deactivated.  Hospitalization c/b PNA and paroxysmal atrial fibrillation. Plan to have cardiac PET at [facility] for cardiac sarcoidosis work up.  He returns for followup of CHF and VT.  His ICD remains inactivated.  He has been to the ER a couple of times with palpitations but ECGs have shown NSR. He is taking prednisone for possible sarcoidosis, Dr. Lovena Le recently decreased to 20 mg daily. He still has not heard from [facility] regarding the scheduling of cardiac PET. He is short of breath walking 50-75 yards. He is able to walk up a flight of stairs. No syncope.  Generally no lightheadedness though BP gets low at times at HD.   ECG  (personally reviewed): NSR, LBBB-like IVCD 158 msec  Labs (6/22): LDL 75 Labs (8/22): hgb 13.9 Labs (10/23): K 4.2, mag 2, hgb 10.3 Labs (11/23): hgb 12.9  PMH: 1. CAD: PCI in [id number], uncertain what vessel.  - Cardiolite 2017: Inferior and inferolateral large fixed defect suggestive of prior infarction, EF 28%.  - LHC (8/22): No significant CAD.  2. Chronic systolic CHF: Ischemic cardiomyopathy. St Jude subcutaneous ICD.  - Echo (7/20) with EF 25-30%.  - Echo (10/20) with EF 40-45% - Echo (2/22): EF 25-30%, moderate LV dilation, mild LVH, normal RV size and systolic function, mild MR.  - LHC/RHC (8/22): mean RA 2, PA 24/3, mean PCWP 9, CI 3.13; no significant coronary disease.  - Echo (11/22): EF 25-30%, moderate LV dilation, diffuse hypokinesis, normal RV. - Echo (9/23): EF 20-25%, severe LV dysfunction with global HK, grade III DD, normal RV, mild to moderate MR - R/LHC (9/23): non-obs CAD, elevated R/L pressures with pulmonary venous hypertension. RA mean 14, PA 53/27  (mean 37), PCWP mean 26, CO/CI ([place]) 8.98/3.96, PVR 2.8 WU 3. ESRD since 2/22 4. OSA on CPAP 5. H/o VT arrest: Subcutaneous St Jude ICD placed in 2015 at [facility].  - VT storm 6/23, amiodarone started.  - VT storm 9/23, mexiletine started 6. COVID-19 1/22.  7. HTN 8. Hyperlipidemia 9. Recurrent angioedema.  10. ? Sarcoidosis: Hi-Res chest CT (10/23) with no pulmonary findings suggestive of pulmonary sarcoidosis.  - Start on empiric prednisone. - Cardiac PET  at [facility] ordered  Social History   Socioeconomic History   Marital status: Single    Spouse name: Not on file   Number of children: 3   Years of education: Not on file   Highest education level: Not on file  Occupational History   Occupation: retired  Tobacco Use   Smoking status: Never   Smokeless tobacco: Never  Vaping Use   Vaping Use: Never used  Substance and Sexual Activity   Alcohol use: No   Drug use: No   Sexual activity: Yes  Other Topics  Concern   Not on file  Social History Narrative   ** Merged History Encounter **       Social Determinants of Health   Financial Resource Strain: Not on file  Food Insecurity: No Food Insecurity (08/28/2022)   Hunger Vital Sign    Worried About Running Out of Food in the Last Year: Never true    Ran Out of Food in the Last Year: Never true  Transportation Needs: No Transportation Needs (09/15/2022)   PRAPARE - Hydrologist (Medical): No    Lack of Transportation (Non-Medical): No  Physical Activity: Insufficiently Active (08/20/2022)   Exercise Vital Sign    Days of Exercise per Week: 1 day    Minutes of Exercise per Session: 10 min  Stress: Not on file  Social Connections: Not on file  Intimate Partner Violence: Not At Risk (08/28/2022)   Humiliation, Afraid, Rape, and Kick questionnaire    Fear of Current or Ex-Partner: No    Emotionally Abused: No    Physically Abused: No    Sexually Abused: No   Family History  Problem Relation Age of Onset   Hypertension Mother    Liver disease Father    Colon cancer Neg Hx    Esophageal cancer Neg Hx    Rectal cancer Neg Hx    Stomach cancer Neg Hx    ROS: All systems reviewed and negative except as per HPI.   Current Outpatient Medications  Medication Sig Dispense Refill   albuterol (PROVENTIL) (2.5 MG/3ML) 0.083% nebulizer solution INHALE THE CONTENTS OF 1 VIAL BY MOUTH EVERY 6 HOURS AS NEEDED 675 mL 0   albuterol (VENTOLIN HFA) 108 (90 Base) MCG/ACT inhaler NEW PRESCRIPTION REQUEST: Albuterol Hfa 90 Mcg- INHALE TWO PUFFS BY MOUTH EVERY 4 HOURS AS NEEDED 60.3 g 6   amiodarone (PACERONE) 200 MG tablet Take 1 tablet (200 mg total) by mouth 2 (two) times daily. 60 tablet 2   apixaban (ELIQUIS) 5 MG TABS tablet NEW PRESCRIPTION REQUEST: Eliquis 5 Mg - TAKE ONE TABLET BY MOUTH TWICE DAILY 180 tablet 3   atorvastatin (LIPITOR) 80 MG tablet Take 1 tablet (80 mg total) by mouth at bedtime. 90 tablet 3    budesonide-formoterol (SYMBICORT) 80-4.5 MCG/ACT inhaler INHALE TWO PUFFS INTO THE LUNGS TWICE DAILY (BULK) 10.2 g 6   calcium acetate (PHOSLO) 667 MG capsule Take 2 capsule by mouth three times a day with meals 180 capsule 6   carvedilol (COREG) 12.5 MG tablet NEW PRESCRIPTION REQUEST: Carvedilol 12.5 Mg- TAKE ONE TABLET BY MOUTH TWICE DAILY 180 tablet 3   cetirizine (ZYRTEC) 10 MG tablet Take 1 tablet (10 mg total) by mouth 2 (two) times daily. 180 tablet 1   Cholecalciferol 125 MCG (5000 UT) TABS Take 1 tablet (5,000 Units total) by mouth daily. 90 tablet 1   docusate sodium (COLACE) 100 MG capsule TAKE ONE CAPSULE BY MOUTH DAILY  90 capsule 0   EPINEPHrine 0.3 mg/0.3 mL IJ SOAJ injection Inject 0.3 mg into the muscle once as needed for up to 2 doses (if worsening tongue swelling, SOB, hypoxia, or other concerns for progressive anaphylaxis). 2 each 2   ethyl chloride spray SPRAY A SMALL AMOUNT THREE TIMES A WEEK JUST PRIOR TO NEEDLE INSERTION 116 mL 5   fluticasone (FLONASE) 50 MCG/ACT nasal spray INSTILL 1 SPRAY IN EACH NOSTRIL DAILY AS NEEDED 47.4 mL 0   hydrOXYzine (ATARAX) 25 MG tablet Take 1-2 tablets (25-50 mg total) by mouth at bedtime as needed for anxiety/insomnia. 60 tablet 1   isosorbide-hydrALAZINE (BIDIL) 20-37.5 MG tablet Take 0.5 tablets by mouth as directed. Every Mon, Wed, Fri and Sun Three times a day, Do Not Take within 24 hrs of Viagra 45 tablet 3   LINZESS 72 MCG capsule TAKE ONE CAPSULE BY MOUTH ON MONDAY, WEDNESDAY, FRIDAY AND SATURDAY 30 capsule 1   mexiletine (MEXITIL) 150 MG capsule NEW PRESCRIPTION REQUEST: Mexiletine 150 Mg- TAKE TWO CAPSULES BY MOUTH TWICE DAILY 360 capsule 3   pantoprazole (PROTONIX) 40 MG tablet NEW PRESCRIPTION REQUEST: Pantoprazole Sod Dr 40 Mg - TAKE ONE TABLET BY MOUTH DAILY 90 tablet 3   Polyethylene Glycol 3350 (PEG 3350) 17 g PACK NEW PRESCRIPTION REQUEST: Polyethylene Glycol 3350- 1 PACKET BY MOUTH DAILY AS NEEDED 90 packet 0   predniSONE  (DELTASONE) 10 MG tablet TAKE ONE TABLET BY MOUTH ONCE DAILY WITH BREAKFAST 30 tablet 6   sildenafil (VIAGRA) 50 MG tablet Take 50 mg by mouth daily as needed.     VELPHORO 500 MG chewable tablet Chew 1,000 mg by mouth 3 (three) times daily.     No current facility-administered medications for this visit.   Wt Readings from Last 3 Encounters:  12/14/22 113.8 kg (250 lb 12.8 oz)  11/05/22 113.4 kg (250 lb)  11/05/22 113.4 kg (250 lb)   There were no vitals taken for this visit. General: NAD Neck: No JVD, no thyromegaly or thyroid nodule.  Lungs: Clear to auscultation bilaterally with normal respiratory effort. CV: Nondisplaced PMI.  Heart regular S1/S2, no S3/S4, no murmur.  No peripheral edema.  No carotid bruit.  Normal pedal pulses.  Abdomen: Soft, nontender, no hepatosplenomegaly, no distention.  Skin: Intact without lesions or rashes.  Neurologic: Alert and oriented x 3.  Psych: Normal affect. Extremities: No clubbing or cyanosis.  HEENT: Normal.   Assessment/Plan: 1. VT storm: Has has a Animator ICD.  Admission in 9/23 with refractory monomorphic VT. Multiple shocks.  Later alternating NSR with a slow wide complex AIVR-like rhythm. Recurrent monomorphic VT that same admission w/ repeated shocks>>re-intubated, rebolused with amio, quinidine discontinued, started on procainamide.  Given concern for possible cardiac sarcoid, started empirically on prednisone. HiRes Chest CT this admit with no evidence of pulmonary sarcoid. Cath this admit with nonobstructive CAD. Echo 9/23 with EF 20-25%, GIIIDD (restrictive), RV normal. Recurrent VT on 09/07/22 with ICD shock.  He is on amiodarone 400 mg daily + mexiletine 300 bid + Coreg 12.5 mg bid.   - EP has decided against VT ablation, do not think it would be likely to be successful and very high risk.  ICD is now de-activated. He has felt palpitations recently, no syncope. - Continue Coreg 12.5 mg bid.  - Continue amiodarone 400 mg daily.   Further down-tapering per EP. - Continue mexiletine 300 mg bid.  - Long term very concerning, not candidate for LVAD with ESRD.   -  He is on empiric prednisone for possible cardiac sarcoidosis at 20 mg daily.  He still has not had cardiac PET.  I will decrease prednisone to 10 mg daily and he can stop Bactrim prophylaxis.  Will try to get him off prednisone prior cardiac PET to have best chance of detecting active cardiac sarcoidosis.  - Will continue to try to arrange cardiac PET at [facility] to evaluate for cardiac sarcoidosis. - Continue EP followup.  2. Chronic systolic CHF: H/o NICM. Echo (9/23) with EF 20-25%, normal RV function, mild-moderate MR, IVC dilated.  In 9/23, unable to complete cardiac MRI due to artifact from [facility] ICD.  PYP scan was negative.  RHC in 9/23 showed volume overload>>CRRT for volume removal.  Now volume management via iHD. Today, NYHA II-early III, not significantly volume overloaded on exam.  - Continue Coreg 12.5 mg bid.  - Will have him try Bidil 1/2 tid on non-HD days.  - He has a LBBB-like IVCD but has [place] ICD and questionable benefit to change from [facility] ICD to BiV device in dialysis patient.  3. CAD: ?PCI in 2005, unsure what vessel.  Coronary angiography in 8/22 showed no significant CAD and also does not appear to show prior stent so h/o CAD is questionable. LHC in 9/23 with nonobstructive CAD.  - Continue statin. - No ASA with anticoagulation.  4. ESRD: Since 2/22. TTS HD.  5. Atrial fibrillation: Paroxysmal.  NSR today.  - Remains on amiodarone. Follow LFTs and TSH. He will need a regular eye exam.  - Continue Eliquis. No bleeding issues.  Followup 2 months with APP. Work to expedite PET.   [place]  01/18/2023

## 2023-01-19 ENCOUNTER — Encounter (HOSPITAL_COMMUNITY): Payer: Self-pay

## 2023-01-19 ENCOUNTER — Telehealth: Payer: Self-pay | Admitting: *Deleted

## 2023-01-19 ENCOUNTER — Ambulatory Visit (HOSPITAL_COMMUNITY)
Admission: RE | Admit: 2023-01-19 | Discharge: 2023-01-19 | Disposition: A | Discharge status: Home / Self Care | Payer: 59 | Source: Ambulatory Visit | Attending: Family Medicine | Admitting: Family Medicine

## 2023-01-19 VITALS — BP 110/70 | HR 65 | Wt 253.8 lb

## 2023-01-19 DIAGNOSIS — Z95810 Presence of automatic (implantable) cardiac defibrillator: Secondary | ICD-10-CM | POA: Diagnosis not present

## 2023-01-19 DIAGNOSIS — I2510 Atherosclerotic heart disease of native coronary artery without angina pectoris: Secondary | ICD-10-CM | POA: Diagnosis not present

## 2023-01-19 DIAGNOSIS — I472 Ventricular tachycardia, unspecified: Secondary | ICD-10-CM | POA: Insufficient documentation

## 2023-01-19 DIAGNOSIS — Z8674 Personal history of sudden cardiac arrest: Secondary | ICD-10-CM | POA: Insufficient documentation

## 2023-01-19 DIAGNOSIS — I5022 Chronic systolic (congestive) heart failure: Secondary | ICD-10-CM

## 2023-01-19 DIAGNOSIS — I255 Ischemic cardiomyopathy: Secondary | ICD-10-CM | POA: Diagnosis not present

## 2023-01-19 DIAGNOSIS — Z7952 Long term (current) use of systemic steroids: Secondary | ICD-10-CM | POA: Insufficient documentation

## 2023-01-19 DIAGNOSIS — Z79899 Other long term (current) drug therapy: Secondary | ICD-10-CM | POA: Insufficient documentation

## 2023-01-19 DIAGNOSIS — I132 Hypertensive heart and chronic kidney disease with heart failure and with stage 5 chronic kidney disease, or end stage renal disease: Secondary | ICD-10-CM | POA: Insufficient documentation

## 2023-01-19 DIAGNOSIS — I447 Left bundle-branch block, unspecified: Secondary | ICD-10-CM | POA: Diagnosis not present

## 2023-01-19 DIAGNOSIS — Z992 Dependence on renal dialysis: Secondary | ICD-10-CM

## 2023-01-19 DIAGNOSIS — R0602 Shortness of breath: Secondary | ICD-10-CM | POA: Diagnosis not present

## 2023-01-19 DIAGNOSIS — N186 End stage renal disease: Secondary | ICD-10-CM | POA: Diagnosis not present

## 2023-01-19 DIAGNOSIS — I480 Paroxysmal atrial fibrillation: Secondary | ICD-10-CM | POA: Insufficient documentation

## 2023-01-19 DIAGNOSIS — Z8616 Personal history of COVID-19: Secondary | ICD-10-CM | POA: Insufficient documentation

## 2023-01-19 DIAGNOSIS — Z7901 Long term (current) use of anticoagulants: Secondary | ICD-10-CM | POA: Diagnosis not present

## 2023-01-19 LAB — CBC
HCT: 44.1 % (ref 39.0–52.0)
Hemoglobin: 14.1 g/dL (ref 13.0–17.0)
MCH: 32.8 pg (ref 26.0–34.0)
MCHC: 32 g/dL (ref 30.0–36.0)
MCV: 102.6 fL — ABNORMAL HIGH (ref 80.0–100.0)
Platelets: 206 10*3/uL (ref 150–400)
RBC: 4.3 MIL/uL (ref 4.22–5.81)
RDW: 16.7 % — ABNORMAL HIGH (ref 11.5–15.5)
WBC: 6.4 10*3/uL (ref 4.0–10.5)
nRBC: 0 % (ref 0.0–0.2)

## 2023-01-19 LAB — HEPATIC FUNCTION PANEL
ALT: 74 U/L — ABNORMAL HIGH (ref 0–44)
AST: 40 U/L (ref 15–41)
Albumin: 3.2 g/dL — ABNORMAL LOW (ref 3.5–5.0)
Alkaline Phosphatase: 72 U/L (ref 38–126)
Bilirubin, Direct: <0.1 mg/dL (ref 0.0–0.2)
Total Bilirubin: 0.3 mg/dL (ref 0.3–1.2)
Total Protein: 6.4 g/dL — ABNORMAL LOW (ref 6.5–8.1)

## 2023-01-19 LAB — TSH: TSH: 2.599 u[IU]/mL (ref 0.350–4.500)

## 2023-01-19 MED ORDER — ISOSORB DINITRATE-HYDRALAZINE 20-37.5 MG PO TABS: 0.5000 | ORAL_TABLET | ORAL | 3 refills | Status: DC

## 2023-01-19 MED ORDER — CARVEDILOL 6.25 MG PO TABS: 6.2500 mg | ORAL_TABLET | Freq: Two times a day (BID) | ORAL | 3 refills | Status: DC

## 2023-01-19 MED ORDER — CARVEDILOL 12.5 MG PO TABS: ORAL_TABLET | ORAL | 3 refills | Status: DC

## 2023-01-19 NOTE — Addendum Note (Signed)
Encounter addended by: Payton Mccallum, RN on: 01/19/2023 3:55 PM  Actions taken: Medication long-term status modified, Order list changed, Clinical Note Signed

## 2023-01-19 NOTE — Patient Instructions (Addendum)
Thank you for coming in today  Labs were done today, if any labs are abnormal the clinic will call you No news is good news  Your physician recommends that you schedule a follow-up appointment in: 2-3 months Dr. Kendall Flack will receive a reminder letter in the mail a few months in advance. If you don't receive a letter, please call our office to schedule the follow-up appointment.  Please call and schedule a eye exam.  DO NOT TAKE TAKE TUESDAY THURSDAY SATURDAY STOP BIDIL    Do the following things EVERYDAY: Weigh yourself in the morning before breakfast. Write it down and keep it in a log. Take your medicines as prescribed Eat low salt foods--Limit salt (sodium) to 2000 mg per day.  Stay as active as you can everyday Limit all fluids for the day to less than 2 liters  At the [facility], you and your health needs are our priority. As part of our continuing mission to provide you with exceptional heart care, we have created designated Provider Care Teams. These Care Teams include your primary Cardiologist (physician) and Advanced Practice Providers (APPs- Physician Assistants and Nurse Practitioners) who all work together to provide you with the care you need, when you need it.   You may see any of the following providers on your designated Care Team at your next follow up: Dr Glori Bickers Dr Loralie Champagne Dr. Roxana Hires, NP Lyda Jester, [place] [facility] [place], [place] Forestine Na, NP Audry Riles, PharmD   Please be sure to bring in all your medications bottles to every appointment.    Thank you for choosing [facility]  If you have any questions or concerns before your next appointment please send [place] a message through [place] or call our office at [phone].    TO LEAVE A MESSAGE FOR THE NURSE SELECT OPTION 2, PLEASE LEAVE A MESSAGE INCLUDING: YOUR NAME DATE OF BIRTH CALL BACK  NUMBER REASON FOR CALL**this is important as we prioritize the call backs  YOU WILL RECEIVE A CALL BACK THE SAME DAY AS LONG AS YOU CALL BEFORE 4:00 PM

## 2023-01-19 NOTE — Progress Notes (Signed)
Allena Katz NP is not decreasing coreg dose. Called and discussed with pt that coreg dose of 12.5 mg will stay the same except pt will not take coreg on the days that he has dialysis Tuesday Thursday and Saturdays. New Rx sent into pharmacy and pt acknowledged understanding.

## 2023-01-20 ENCOUNTER — Other Ambulatory Visit: Payer: Self-pay | Admitting: Cardiology

## 2023-01-20 DIAGNOSIS — I5042 Chronic combined systolic (congestive) and diastolic (congestive) heart failure: Secondary | ICD-10-CM

## 2023-01-20 DIAGNOSIS — N2581 Secondary hyperparathyroidism of renal origin: Secondary | ICD-10-CM | POA: Diagnosis not present

## 2023-01-20 DIAGNOSIS — Z992 Dependence on renal dialysis: Secondary | ICD-10-CM | POA: Diagnosis not present

## 2023-01-20 DIAGNOSIS — N186 End stage renal disease: Secondary | ICD-10-CM | POA: Diagnosis not present

## 2023-01-20 DIAGNOSIS — D631 Anemia in chronic kidney disease: Secondary | ICD-10-CM | POA: Diagnosis not present

## 2023-01-20 DIAGNOSIS — I2510 Atherosclerotic heart disease of native coronary artery without angina pectoris: Secondary | ICD-10-CM

## 2023-01-20 DIAGNOSIS — D509 Iron deficiency anemia, unspecified: Secondary | ICD-10-CM | POA: Diagnosis not present

## 2023-01-20 DIAGNOSIS — T7840XA Allergy, unspecified, initial encounter: Secondary | ICD-10-CM | POA: Diagnosis not present

## 2023-01-20 DIAGNOSIS — L299 Pruritus, unspecified: Secondary | ICD-10-CM | POA: Diagnosis not present

## 2023-01-21 ENCOUNTER — Ambulatory Visit: Payer: Self-pay

## 2023-01-21 NOTE — Patient Outreach (Signed)
  Care Coordination   01/21/2023 Name: Victor Thompson MRN: [id number] DOB: 07/28/1961   Care Coordination Outreach Attempts:  An unsuccessful telephone outreach was attempted for a scheduled appointment today.  Follow Up Plan:  Additional outreach attempts will be made to offer the patient care coordination information and services.   Encounter Outcome:  Pt. Request to Call Back   Care Coordination Interventions:  No, not indicated    Barb Merino, RN, BSN, CCM Care Management Coordinator [place] Management Direct Phone: [phone]

## 2023-01-22 DIAGNOSIS — L299 Pruritus, unspecified: Secondary | ICD-10-CM | POA: Diagnosis not present

## 2023-01-22 DIAGNOSIS — T7840XA Allergy, unspecified, initial encounter: Secondary | ICD-10-CM | POA: Diagnosis not present

## 2023-01-22 DIAGNOSIS — N2581 Secondary hyperparathyroidism of renal origin: Secondary | ICD-10-CM | POA: Diagnosis not present

## 2023-01-22 DIAGNOSIS — D509 Iron deficiency anemia, unspecified: Secondary | ICD-10-CM | POA: Diagnosis not present

## 2023-01-22 DIAGNOSIS — N186 End stage renal disease: Secondary | ICD-10-CM | POA: Diagnosis not present

## 2023-01-22 DIAGNOSIS — G4733 Obstructive sleep apnea (adult) (pediatric): Secondary | ICD-10-CM | POA: Diagnosis not present

## 2023-01-22 DIAGNOSIS — D631 Anemia in chronic kidney disease: Secondary | ICD-10-CM | POA: Diagnosis not present

## 2023-01-22 DIAGNOSIS — Z992 Dependence on renal dialysis: Secondary | ICD-10-CM | POA: Diagnosis not present

## 2023-01-24 ENCOUNTER — Ambulatory Visit: Payer: Self-pay

## 2023-01-24 NOTE — Patient Instructions (Addendum)
Visit Information  Thank you for taking time to visit with me today. Please don't hesitate to contact me if I can be of assistance to you.   Following are the goals we discussed today:   Goals Addressed               This Visit's Progress     Patient Stated     I need help with my medications (pt-stated)        Care Coordination Interventions: Patient interviewed about adult health maintenance status including  medication adherence Determined patient is not satisfied with the [place], he would like to switch his prescriptions back to [facility] Discussed patient spoke with Eden Lathe RN CM with his PCP office about his request to switch his meds back to [facility] an in basket message to [place] requesting an update on patient's Rx change to [place]  In [employer] received from [place]  advising patient was suppose to contact [place] to advise of his request to start using their mail order pharmacy Spoke with patient to determine patient contacted [facility] and was advised his medications would be received within 2 weeks, however he has not received them Instructed patient to contact [facility] to get an update on his expected delivery date Encouraged patient to contact this RN if further assistance is needed and patient verbalizes understanding         I want my breathing and chest soreness to get better (pt-stated)        Care Coordination Interventions: Evaluation of current treatment plan related to paroxsymal ventricular tachycardia and patient's adherence to plan as established by provider Determined patient completed a recent follow up with Cardiology Review of patient status, including review of consultant's reports, relevant laboratory and other test results, and medications completed Determined patient would like clarification on how/when to take his Carvedilol due to having his BP drop during his HD txts Sent in basket message to Cardiology provider Rafael Bihari, FNP advising of patient's hypotension during HD txts and requested consideration to have patient hold his Carvedilol on his off dialysis days in order to have a stable BP during txt In basket message received from [place] advising patient may take his Coreg on his off dialysis days and hold on his dialysis days, patient informed and verbalizes understanding          Our next appointment is by telephone on 02/07/23 at 11:30 AM  Please call the care guide team at [phone] if you need to cancel or reschedule your appointment.   If you are experiencing a Mental Health or [place] or need someone to talk to, please call 1-800-273-TALK (toll free, 24 hour hotline) go to [facility] [address], [place] [phone])  Patient verbalizes understanding of instructions and care plan provided today and agrees to view in [place]. Active MyChart status and patient understanding of how to access instructions and care plan via MyChart confirmed with patient.     Barb Merino, RN, BSN, CCM Care Management Coordinator [facility] Care Management Direct Phone: [phone]

## 2023-01-24 NOTE — Patient Outreach (Addendum)
  Care Coordination   Follow Up Visit Note   01/24/2023 Name: Victor Thompson MRN: [id number] DOB: May 05, 1961  Victor Thompson is a 62 y.o. year old male who sees Victor Pier, MD for primary care. I spoke with  Victor Thompson by phone today.  What matters to the patients health and wellness today?  Patient needs clarification from Cardiology concerning his Carvedilol dosage. He needs assistance with switching his pharmacy to [facility].     Goals Addressed               This Visit's Progress     Patient Stated     I need help with my medications (pt-stated)        Care Coordination Interventions: Patient interviewed about adult health maintenance status including  medication adherence Determined patient is not satisfied with the [place], he would like to switch his prescriptions back to [facility] Discussed patient spoke with Victor Lathe RN CM with his PCP office about his request to switch his meds back to [facility] an in basket message to [place] requesting an update on patient's Rx change to [place]  In [employer] received from [place]  advising patient was suppose to contact [place] to advise of his request to start using their mail order pharmacy Spoke with patient to determine patient contacted [facility] and was advised his medications would be received within 2 weeks, however he has not received them Instructed patient to contact Victor Thompson to get an update on his expected delivery date Encouraged patient to contact this RN if further assistance is needed and patient verbalizes understanding         I want my breathing and chest soreness to get better (pt-stated)        Care Coordination Interventions: Evaluation of current treatment plan related to paroxsymal ventricular tachycardia and patient's adherence to plan as established by provider Determined patient completed a recent follow up with Cardiology Review of patient status, including review of consultant's  reports, relevant laboratory and other test results, and medications completed Determined patient would like clarification on how/when to take his Carvedilol due to having his BP drop during his HD txts Sent in basket message to Cardiology provider Victor Bihari, Victor Thompson advising of patient's hypotension during HD txts and requested consideration to have patient hold his Carvedilol on his off dialysis days in order to have a stable BP during txt In basket message received from Victor Thompson advising patient may take his Coreg on his off dialysis days and hold on his dialysis days, patient informed and verbalizes understanding          SDOH assessments and interventions completed:  No     Care Coordination Interventions:  Yes, provided   Follow up plan: Follow up call scheduled for 02/07/23 @11$ :30 AM    Encounter Outcome:  Pt. Visit Completed

## 2023-01-25 DIAGNOSIS — N186 End stage renal disease: Secondary | ICD-10-CM | POA: Diagnosis not present

## 2023-01-25 DIAGNOSIS — D631 Anemia in chronic kidney disease: Secondary | ICD-10-CM | POA: Diagnosis not present

## 2023-01-25 DIAGNOSIS — Z992 Dependence on renal dialysis: Secondary | ICD-10-CM | POA: Diagnosis not present

## 2023-01-25 DIAGNOSIS — N2581 Secondary hyperparathyroidism of renal origin: Secondary | ICD-10-CM | POA: Diagnosis not present

## 2023-01-25 DIAGNOSIS — D509 Iron deficiency anemia, unspecified: Secondary | ICD-10-CM | POA: Diagnosis not present

## 2023-01-25 DIAGNOSIS — L299 Pruritus, unspecified: Secondary | ICD-10-CM | POA: Diagnosis not present

## 2023-01-25 DIAGNOSIS — T7840XA Allergy, unspecified, initial encounter: Secondary | ICD-10-CM | POA: Diagnosis not present

## 2023-01-27 DIAGNOSIS — D631 Anemia in chronic kidney disease: Secondary | ICD-10-CM | POA: Diagnosis not present

## 2023-01-27 DIAGNOSIS — N186 End stage renal disease: Secondary | ICD-10-CM | POA: Diagnosis not present

## 2023-01-27 DIAGNOSIS — L299 Pruritus, unspecified: Secondary | ICD-10-CM | POA: Diagnosis not present

## 2023-01-27 DIAGNOSIS — T7840XA Allergy, unspecified, initial encounter: Secondary | ICD-10-CM | POA: Diagnosis not present

## 2023-01-27 DIAGNOSIS — Z992 Dependence on renal dialysis: Secondary | ICD-10-CM | POA: Diagnosis not present

## 2023-01-27 DIAGNOSIS — N2581 Secondary hyperparathyroidism of renal origin: Secondary | ICD-10-CM | POA: Diagnosis not present

## 2023-01-27 DIAGNOSIS — D509 Iron deficiency anemia, unspecified: Secondary | ICD-10-CM | POA: Diagnosis not present

## 2023-01-29 DIAGNOSIS — T7840XA Allergy, unspecified, initial encounter: Secondary | ICD-10-CM | POA: Diagnosis not present

## 2023-01-29 DIAGNOSIS — N2581 Secondary hyperparathyroidism of renal origin: Secondary | ICD-10-CM | POA: Diagnosis not present

## 2023-01-29 DIAGNOSIS — L299 Pruritus, unspecified: Secondary | ICD-10-CM | POA: Diagnosis not present

## 2023-01-29 DIAGNOSIS — D509 Iron deficiency anemia, unspecified: Secondary | ICD-10-CM | POA: Diagnosis not present

## 2023-01-29 DIAGNOSIS — Z992 Dependence on renal dialysis: Secondary | ICD-10-CM | POA: Diagnosis not present

## 2023-01-29 DIAGNOSIS — N186 End stage renal disease: Secondary | ICD-10-CM | POA: Diagnosis not present

## 2023-01-29 DIAGNOSIS — D631 Anemia in chronic kidney disease: Secondary | ICD-10-CM | POA: Diagnosis not present

## 2023-02-01 DIAGNOSIS — D509 Iron deficiency anemia, unspecified: Secondary | ICD-10-CM | POA: Diagnosis not present

## 2023-02-01 DIAGNOSIS — Z992 Dependence on renal dialysis: Secondary | ICD-10-CM | POA: Diagnosis not present

## 2023-02-01 DIAGNOSIS — N186 End stage renal disease: Secondary | ICD-10-CM | POA: Diagnosis not present

## 2023-02-01 DIAGNOSIS — T7840XA Allergy, unspecified, initial encounter: Secondary | ICD-10-CM | POA: Diagnosis not present

## 2023-02-01 DIAGNOSIS — D631 Anemia in chronic kidney disease: Secondary | ICD-10-CM | POA: Diagnosis not present

## 2023-02-01 DIAGNOSIS — N2581 Secondary hyperparathyroidism of renal origin: Secondary | ICD-10-CM | POA: Diagnosis not present

## 2023-02-01 DIAGNOSIS — L299 Pruritus, unspecified: Secondary | ICD-10-CM | POA: Diagnosis not present

## 2023-02-03 DIAGNOSIS — Z992 Dependence on renal dialysis: Secondary | ICD-10-CM | POA: Diagnosis not present

## 2023-02-03 DIAGNOSIS — D631 Anemia in chronic kidney disease: Secondary | ICD-10-CM | POA: Diagnosis not present

## 2023-02-03 DIAGNOSIS — I129 Hypertensive chronic kidney disease with stage 1 through stage 4 chronic kidney disease, or unspecified chronic kidney disease: Secondary | ICD-10-CM | POA: Diagnosis not present

## 2023-02-03 DIAGNOSIS — T7840XA Allergy, unspecified, initial encounter: Secondary | ICD-10-CM | POA: Diagnosis not present

## 2023-02-03 DIAGNOSIS — L299 Pruritus, unspecified: Secondary | ICD-10-CM | POA: Diagnosis not present

## 2023-02-03 DIAGNOSIS — D509 Iron deficiency anemia, unspecified: Secondary | ICD-10-CM | POA: Diagnosis not present

## 2023-02-03 DIAGNOSIS — N186 End stage renal disease: Secondary | ICD-10-CM | POA: Diagnosis not present

## 2023-02-03 DIAGNOSIS — N2581 Secondary hyperparathyroidism of renal origin: Secondary | ICD-10-CM | POA: Diagnosis not present

## 2023-02-05 ENCOUNTER — Other Ambulatory Visit: Payer: Self-pay | Admitting: Internal Medicine

## 2023-02-05 DIAGNOSIS — Z992 Dependence on renal dialysis: Secondary | ICD-10-CM | POA: Diagnosis not present

## 2023-02-05 DIAGNOSIS — L299 Pruritus, unspecified: Secondary | ICD-10-CM | POA: Diagnosis not present

## 2023-02-05 DIAGNOSIS — D509 Iron deficiency anemia, unspecified: Secondary | ICD-10-CM | POA: Diagnosis not present

## 2023-02-05 DIAGNOSIS — N186 End stage renal disease: Secondary | ICD-10-CM | POA: Diagnosis not present

## 2023-02-05 DIAGNOSIS — N2581 Secondary hyperparathyroidism of renal origin: Secondary | ICD-10-CM | POA: Diagnosis not present

## 2023-02-05 DIAGNOSIS — D631 Anemia in chronic kidney disease: Secondary | ICD-10-CM | POA: Diagnosis not present

## 2023-02-05 DIAGNOSIS — T7840XA Allergy, unspecified, initial encounter: Secondary | ICD-10-CM | POA: Diagnosis not present

## 2023-02-08 DIAGNOSIS — L299 Pruritus, unspecified: Secondary | ICD-10-CM | POA: Diagnosis not present

## 2023-02-08 DIAGNOSIS — Z992 Dependence on renal dialysis: Secondary | ICD-10-CM | POA: Diagnosis not present

## 2023-02-08 DIAGNOSIS — N186 End stage renal disease: Secondary | ICD-10-CM | POA: Diagnosis not present

## 2023-02-08 DIAGNOSIS — T7840XA Allergy, unspecified, initial encounter: Secondary | ICD-10-CM | POA: Diagnosis not present

## 2023-02-08 DIAGNOSIS — D631 Anemia in chronic kidney disease: Secondary | ICD-10-CM | POA: Diagnosis not present

## 2023-02-08 DIAGNOSIS — N2581 Secondary hyperparathyroidism of renal origin: Secondary | ICD-10-CM | POA: Diagnosis not present

## 2023-02-08 DIAGNOSIS — D509 Iron deficiency anemia, unspecified: Secondary | ICD-10-CM | POA: Diagnosis not present

## 2023-02-09 ENCOUNTER — Ambulatory Visit: Payer: Self-pay

## 2023-02-10 ENCOUNTER — Other Ambulatory Visit: Payer: Self-pay

## 2023-02-10 DIAGNOSIS — D631 Anemia in chronic kidney disease: Secondary | ICD-10-CM | POA: Diagnosis not present

## 2023-02-10 DIAGNOSIS — N186 End stage renal disease: Secondary | ICD-10-CM | POA: Diagnosis not present

## 2023-02-10 DIAGNOSIS — T7840XA Allergy, unspecified, initial encounter: Secondary | ICD-10-CM | POA: Diagnosis not present

## 2023-02-10 DIAGNOSIS — N2581 Secondary hyperparathyroidism of renal origin: Secondary | ICD-10-CM | POA: Diagnosis not present

## 2023-02-10 DIAGNOSIS — D509 Iron deficiency anemia, unspecified: Secondary | ICD-10-CM | POA: Diagnosis not present

## 2023-02-10 DIAGNOSIS — Z992 Dependence on renal dialysis: Secondary | ICD-10-CM | POA: Diagnosis not present

## 2023-02-10 DIAGNOSIS — L299 Pruritus, unspecified: Secondary | ICD-10-CM | POA: Diagnosis not present

## 2023-02-10 NOTE — Patient Instructions (Signed)
Visit Information  Thank you for taking time to visit with me today. Please don't hesitate to contact me if I can be of assistance to you.   Following are the goals we discussed today:   Goals Addressed               This Visit's Progress     Patient Stated     COMPLETED: I need help with my medications (pt-stated)        Care Coordination Interventions: Patient interviewed about adult health maintenance status including medication adherence Determined patient is now receiving his medications through [facility] mail order pharmacy Reviewed chart with patient to confirm his mail order pharmacy is [facility]  Determined patient has no further questions or concerns related to his medications at this time       Other     Evaluate and treat hemorrhoids        Care Coordination Interventions: Evaluation of current treatment plan related to hemorrhoids and patient's adherence to plan as established by provider Determined patient is experiencing pain secondary to what he believes is a hemorrhoid Educated patient regarding use of ice packs over the affected area, 20 minutes on, 20 minutes off Instructed patient to discuss his symptoms with GI MD during upcoming visit to ask for recommendations  Reviewed scheduled/upcoming provider appointments including: GI consult with next Dr. Havery Moros scheduled for 02/15/23 '@1'$ :67 PM; PCP follow up appointment scheduled with Dr. [name] on 3/18 '@09'$ :10 AM           Our next appointment is by telephone on 03/04/23 at 2:30 PM   Please call the care guide team at [phone] if you need to cancel or reschedule your appointment.   If you are experiencing a Mental Health or [place] or need someone to talk to, please call 1-800-273-TALK (toll free, 24 hour hotline) go to [facility] [address], [place] [phone])  Patient verbalizes understanding of instructions and care plan  provided today and agrees to view in [place]. Active MyChart status and patient understanding of how to access instructions and care plan via MyChart confirmed with patient.     Barb Merino, RN, BSN, CCM Care Management Coordinator [facility] Care Management Direct Phone: [phone]

## 2023-02-10 NOTE — Patient Outreach (Signed)
  Care Coordination   Follow Up Visit Note   02/09/2023 Name: Victor Thompson MRN: [id number] DOB: 29-Sep-1961  Victor Thompson is a 62 y.o. year old male who sees Victor Pier, MD for primary care. I spoke with  Victor Thompson by phone today.  What matters to the patients health and wellness today?  Patient will keep his upcoming PCP and GI visits as scheduled.     Goals Addressed               This Visit's Progress     Patient Stated     COMPLETED: I need help with my medications (pt-stated)        Care Coordination Interventions: Patient interviewed about adult health maintenance status including medication adherence Determined patient is now receiving his medications through [facility] mail order pharmacy Reviewed chart with patient to confirm his mail order pharmacy is [facility]  Determined patient has no further questions or concerns related to his medications at this time       Other     Evaluate and treat hemorrhoids        Care Coordination Interventions: Evaluation of current treatment plan related to hemorrhoids and patient's adherence to plan as established by provider Determined patient is experiencing pain secondary to what he believes is a hemorrhoid Educated patient regarding use of ice packs over the affected area, 20 minutes on, 20 minutes off Instructed patient to discuss his symptoms with GI MD during upcoming visit to ask for recommendations  Reviewed scheduled/upcoming provider appointments including: GI consult with next Dr. Havery Moros scheduled for 02/15/23 '@1'$ :65 PM; PCP follow up appointment scheduled with Dr. [name] on 3/18 '@09'$ :10 AM       Interventions Today    Flowsheet Row Most Recent Value  Chronic Disease   Chronic disease during today's visit Other  [Hemorrhoid]  General Interventions   General Interventions Discussed/Reviewed General Interventions Discussed, General Interventions Reviewed, Doctor Visits  Doctor Visits  Discussed/Reviewed Doctor Visits Discussed, Doctor Visits Reviewed, PCP, Specialist  PCP/Specialist Visits Compliance with follow-up visit  Pharmacy Interventions   Pharmacy Dicussed/Reviewed Pharmacy Topics Discussed, Medication Adherence          SDOH assessments and interventions completed:  No     Care Coordination Interventions:  Yes, provided   Follow up plan: Follow up call scheduled for 03/04/23 '@2'$ :30 PM    Encounter Outcome:  Pt. Visit Completed

## 2023-02-12 DIAGNOSIS — N186 End stage renal disease: Secondary | ICD-10-CM | POA: Diagnosis not present

## 2023-02-12 DIAGNOSIS — D631 Anemia in chronic kidney disease: Secondary | ICD-10-CM | POA: Diagnosis not present

## 2023-02-12 DIAGNOSIS — L299 Pruritus, unspecified: Secondary | ICD-10-CM | POA: Diagnosis not present

## 2023-02-12 DIAGNOSIS — N2581 Secondary hyperparathyroidism of renal origin: Secondary | ICD-10-CM | POA: Diagnosis not present

## 2023-02-12 DIAGNOSIS — D509 Iron deficiency anemia, unspecified: Secondary | ICD-10-CM | POA: Diagnosis not present

## 2023-02-12 DIAGNOSIS — T7840XA Allergy, unspecified, initial encounter: Secondary | ICD-10-CM | POA: Diagnosis not present

## 2023-02-12 DIAGNOSIS — Z992 Dependence on renal dialysis: Secondary | ICD-10-CM | POA: Diagnosis not present

## 2023-02-15 ENCOUNTER — Telehealth: Payer: Self-pay | Admitting: Internal Medicine

## 2023-02-15 ENCOUNTER — Telehealth: Payer: Self-pay

## 2023-02-15 ENCOUNTER — Ambulatory Visit: Payer: Self-pay

## 2023-02-15 ENCOUNTER — Ambulatory Visit (INDEPENDENT_AMBULATORY_CARE_PROVIDER_SITE_OTHER): Payer: 59 | Admitting: Gastroenterology

## 2023-02-15 ENCOUNTER — Other Ambulatory Visit: Payer: Self-pay

## 2023-02-15 VITALS — BP 110/68 | HR 65 | Ht 67.0 in | Wt 252.2 lb

## 2023-02-15 DIAGNOSIS — I499 Cardiac arrhythmia, unspecified: Secondary | ICD-10-CM

## 2023-02-15 DIAGNOSIS — Z86010 Personal history of colonic polyps: Secondary | ICD-10-CM | POA: Diagnosis not present

## 2023-02-15 DIAGNOSIS — K625 Hemorrhage of anus and rectum: Secondary | ICD-10-CM | POA: Diagnosis not present

## 2023-02-15 DIAGNOSIS — N186 End stage renal disease: Secondary | ICD-10-CM | POA: Diagnosis not present

## 2023-02-15 DIAGNOSIS — K649 Unspecified hemorrhoids: Secondary | ICD-10-CM

## 2023-02-15 DIAGNOSIS — L299 Pruritus, unspecified: Secondary | ICD-10-CM | POA: Diagnosis not present

## 2023-02-15 DIAGNOSIS — Z7901 Long term (current) use of anticoagulants: Secondary | ICD-10-CM

## 2023-02-15 DIAGNOSIS — K5900 Constipation, unspecified: Secondary | ICD-10-CM | POA: Diagnosis not present

## 2023-02-15 DIAGNOSIS — D509 Iron deficiency anemia, unspecified: Secondary | ICD-10-CM | POA: Diagnosis not present

## 2023-02-15 DIAGNOSIS — I5042 Chronic combined systolic (congestive) and diastolic (congestive) heart failure: Secondary | ICD-10-CM | POA: Diagnosis not present

## 2023-02-15 DIAGNOSIS — T7840XA Allergy, unspecified, initial encounter: Secondary | ICD-10-CM | POA: Diagnosis not present

## 2023-02-15 DIAGNOSIS — D631 Anemia in chronic kidney disease: Secondary | ICD-10-CM | POA: Diagnosis not present

## 2023-02-15 DIAGNOSIS — Z992 Dependence on renal dialysis: Secondary | ICD-10-CM | POA: Diagnosis not present

## 2023-02-15 DIAGNOSIS — N2581 Secondary hyperparathyroidism of renal origin: Secondary | ICD-10-CM | POA: Diagnosis not present

## 2023-02-15 MED ORDER — CALMOL-4 76-10 % RE SUPP: RECTAL | 0 refills | Status: DC

## 2023-02-15 MED ORDER — PLENVU 140 G PO SOLR
1.0000 | ORAL | 0 refills | Status: DC
  Filled 2023-02-15: qty 3, 2d supply, fill #0
  Filled 2023-04-27 – 2023-07-25 (×2): qty 3, 1d supply, fill #0

## 2023-02-15 NOTE — Telephone Encounter (Signed)
[facility] Pre-operative Risk Assessment     Request for surgical clearance:     Endoscopy Procedure  What type of surgery is being performed?     COLONOSCOPY  When is this surgery scheduled?     03-17-23  What type of clearance is required ?   Pharmacy  Are there any medications that need to be held prior to surgery and how long? ELIQUIS 2 DAYS  Practice name and name of physician performing surgery?    [facility]  What is your office phone and fax number?      Phone- [phone]  Fax- [phone] ATTN: Lemar Lofty, CMA  Anesthesia type (None, local, MAC, general) ?       MAC   THANK YOU

## 2023-02-15 NOTE — Patient Instructions (Addendum)
You have been scheduled for a colonoscopy. Please follow written instructions given to you at your visit today.  Please pick up your prep supplies at the pharmacy within the next 1-3 days. If you use inhalers (even only as needed), please bring them with you on the day of your procedure.   You will be contacted by our office prior to your procedure for directions on holding your Eliquis  If you do not hear from our office 1 week prior to your scheduled procedure, please call [phone] to discuss.   We have given you samples of the following medication to take: Calmol 4 suppositories: Use as directed as needed  Continue Linzess and Miralax  Thank you for entrusting me with your care and for choosing [employer], Dr. Oakley Cellar     _____________________________________________________  If your blood pressure at your visit was 140/90 or greater, please contact your primary care physician to follow up on this.  _______________________________________________________  If you are age 60 or older, your body mass index should be between 23-30. Your Body mass index is 39.5 kg/m. If this is out of the aforementioned range listed, please consider follow up with your Primary Care Provider.  If you are age 31 or younger, your body mass index should be between 19-25. Your Body mass index is 39.5 kg/m. If this is out of the aformentioned range listed, please consider follow up with your Primary Care Provider.   ________________________________________________________  The [place] GI providers would like to encourage you to use [facility] to communicate with providers for non-urgent requests or questions.  Due to long hold times on the telephone, sending your provider a message by [facility] may be a faster and more efficient way to get a response.  Please allow 48 business hours for a response.  Please remember that this is for non-urgent requests.   _______________________________________________________

## 2023-02-15 NOTE — Telephone Encounter (Signed)
Chief Complaint: back pain Symptoms: left back/side pain, 5/10 at present  Frequency: onset 2 days ago Pertinent Negatives: Patient denies other symptoms Disposition: '[]'$ ED /'[]'$ Urgent Care (no appt availability in office) / '[x]'$ Appointment(In office/virtual)/ '[]'$  [facility] [facility]/ '[]'$ Home Care/ '[]'$ Refused Recommended Disposition /'[]'$ [facility] [facility]/ '[]'$  Follow-up with PCP Additional Notes: Advised no openings in the office, mobile bus is not in [place] this week, patient has upcoming appt on Monday with Dr. [name]. He says he will tough it out until then and if it gets worse he will go to the ED. Placed patient on the waiting list. Gerarda Gunther, RN a teams message in case something could be done sooner.   Reason for Disposition  [1] MODERATE back pain (e.g., interferes with normal activities) AND [2] present > 3 days  Answer Assessment - Initial Assessment Questions 1. ONSET: "When did the pain begin?"       2 days ago 2. LOCATION: "Where does it hurt?" (upper, mid or lower back)     Left side/back 3. SEVERITY: "How bad is the pain?"  (e.g., Scale 1-10; mild, moderate, or severe)   - MILD (1-3): Doesn't interfere with normal activities.    - MODERATE (4-7): Interferes with normal activities or awakens from sleep.    - SEVERE (8-10): Excruciating pain, unable to do any normal activities.      8.5 4. PATTERN: "Is the pain constant?" (e.g., yes, no; constant, intermittent)      Constant 5. RADIATION: "Does the pain shoot into your legs or somewhere else?"     No 6. CAUSE:  "What do you think is causing the back pain?"      I don't know 7. BACK OVERUSE:  "Any recent lifting of heavy objects, strenuous work or exercise?"     Recent lifting 8. MEDICINES: "What have you taken so far for the pain?" (e.g., nothing, acetaminophen, NSAIDS)     Tylenol, muscle relaxer (helps a little) 9. NEUROLOGIC SYMPTOMS: "Do you have any weakness, numbness, or problems with bowel/bladder  control?"     No 10. OTHER SYMPTOMS: "Do you have any other symptoms?" (e.g., fever, abdomen pain, burning with urination, blood in urine)       No  Protocols used: Back Pain-A-AH

## 2023-02-15 NOTE — Progress Notes (Signed)
HPI :  62 year old male here for a follow-up visit for history of colon polyps, chronic constipation, rectal bleeding.  I last saw him in May 2023 for some of these issues.  We had discussed doing a surveillance colonoscopy, as his last exam was in 2020 and he had 5 adenomas removed.  He was scheduled for his colonoscopy in September at the hospital in light of his end-stage renal disease and cardiomyopathy.  Unfortunately prior to that procedure, he was admitted to the hospital with VT storm. Required intubation, treated with IV amiodarone. Echo showed EF 20-25%, GIIIDD (restrictive), RV normal. R/LHC showed nonobstructive CAD and elevated right and left filling pressures. Underwent CRRT for volume removal. EP consulted and started on procainamide. Started on prednisone for suspected sarcoid. Mexiletine later added for VT suppression. ICD deactivated.  Hospitalization c/b PNA and paroxysmal atrial fibrillation.  He was supposed to have cardiac PET at [facility] for cardiac sarcoidosis work up however this has not been done yet as his insurance has not covered it.  He states in general has been feeling pretty well since that hospitalization.  He has an occasional "flutter" in his chest but really no significant arrhythmias that he is aware of.  He remains on 10 mg of prednisone per day.  He also remains on Eliquis for history of A-fib.  Recall he has had problems with ongoing constipation for some time.  He is currently on Linzess 72 mcg/day as well as MiraLAX as needed.  He states this regimen been working fairly well for him.  He still has some straining on some days but it is not as bad.  He does have irritation and bleeding from his hemorrhoids which he says is mostly superficial.  His hemoglobin was checked recently and normal.  I recommended Calmol 4 suppositories to him at our last visit and he does not think he ever tried them.  He denies any significant rectal pain.  Prior exams: Colonoscopy  02/01/2019: One 6 mm polyp in the cecum, removed with a cold snare. Resected and retrieved. - Three 4 to 5 mm polyps in the ascending colon, removed with a cold snare. Resected and retrieved. - One 3 mm polyp in the sigmoid colon, removed with a cold snare. Resected and retrieved. - Polypoid lesion at the anus. Biopsied. - Internal hemorrhoids. - The examination was otherwise normal.   1. Surgical [P], colon, ascending and cecum, sigmoid, polyp (5) - TUBULAR ADENOMA(S) - NEGATIVE FOR HIGH-GRADE DYSPLASIA OR MALIGNANCY 2. Surgical [P], anal canal polyp - ANAL SQUAMOUS MUCOSA WITH REACTIVE CHANGES AND HYPERKERATOSIS - NO EVIDENCE OF MALIGNANCY     Echo 10/21/21: EF 20-30%, grade I DD   Admitted 9/23 with VT storm.  Echo showed EF 20-25%, GIIIDD (restrictive), RV normal. R/LHC showed nonobstructive CAD and elevated right and left filling pressures.   Past Medical History:  Diagnosis Date   Acute on chronic systolic CHF (congestive heart failure) ([place]) 06/14/2020   Acute respiratory failure ([place]) 05/28/2022   AICD (automatic cardioverter/defibrillator) present    2015   Allergy    Asthma    no meds   Cardiac arrest ([place]) 2015   Chest pain 05/25/2022   CHF (congestive heart failure) (HCC)    Coronary artery disease    ESRD on hemodialysis (HCC)    ckd -stage 5   Hyperlipidemia    Hypertension    Myocardial infarction ([place])    Obesity    Pneumonia    Shortness of breath dyspnea  Sleep apnea    USES CPAP   Wears glasses      Past Surgical History:  Procedure Laterality Date   AV FISTULA PLACEMENT Right 03/15/2019   Procedure: RIGHT ARM ARTERIOVENOUS (AV) FISTULA CREATION;  Surgeon: Angelia Mould, MD;  Location: [facility];  Service: Vascular;  Laterality: Right;   COLONOSCOPY     COLONOSCOPY W/ BIOPSIES AND POLYPECTOMY     CORONARY STENT PLACEMENT  2007   IMPLANTABLE CARDIOVERTER DEFIBRILLATOR IMPLANT  2015   RIGHT/LEFT HEART CATH AND CORONARY ANGIOGRAPHY N/A  07/08/2021   Procedure: RIGHT/LEFT HEART CATH AND CORONARY ANGIOGRAPHY;  Surgeon: Larey Dresser, MD;  Location: [facility];  Service: Cardiovascular;  Laterality: N/A;   RIGHT/LEFT HEART CATH AND CORONARY ANGIOGRAPHY N/A 08/25/2022   Procedure: RIGHT/LEFT HEART CATH AND CORONARY ANGIOGRAPHY;  Surgeon: Larey Dresser, MD;  Location: [facility];  Service: Cardiovascular;  Laterality: N/A;   SUBQ ICD CHANGEOUT N/A 10/13/2020   Procedure: SUBQ ICD CHANGEOUT;  Surgeon: Evans Lance, MD;  Location: [facility];  Service: Cardiovascular;  Laterality: N/A;   Family History  Problem Relation Age of Onset   Hypertension Mother    Liver disease Father    Colon cancer Neg Hx    Esophageal cancer Neg Hx    Rectal cancer Neg Hx    Stomach cancer Neg Hx    Social History   Tobacco Use   Smoking status: Never   Smokeless tobacco: Never  Vaping Use   Vaping Use: Never used  Substance Use Topics   Alcohol use: No   Drug use: No   Current Outpatient Medications  Medication Sig Dispense Refill   albuterol (PROVENTIL) (2.5 MG/3ML) 0.083% nebulizer solution INHALE THE CONTENTS OF 1 VIAL BY MOUTH EVERY 6 HOURS AS NEEDED 675 mL 0   albuterol (VENTOLIN HFA) 108 (90 Base) MCG/ACT inhaler NEW PRESCRIPTION REQUEST: Albuterol Hfa 90 Mcg- INHALE TWO PUFFS BY MOUTH EVERY 4 HOURS AS NEEDED 60.3 g 6   amiodarone (PACERONE) 200 MG tablet Take 1 tablet (200 mg total) by mouth 2 (two) times daily. 60 tablet 2   apixaban (ELIQUIS) 5 MG TABS tablet NEW PRESCRIPTION REQUEST: Eliquis 5 Mg - TAKE ONE TABLET BY MOUTH TWICE DAILY 180 tablet 3   atorvastatin (LIPITOR) 80 MG tablet Take 1 tablet (80 mg total) by mouth at bedtime. 90 tablet 3   budesonide-formoterol (SYMBICORT) 80-4.5 MCG/ACT inhaler INHALE TWO PUFFS INTO THE LUNGS TWICE DAILY (BULK) 10.2 g 6   calcium acetate (PHOSLO) 667 MG capsule Take 2 capsule by mouth three times a day with meals 180 capsule 6   carvedilol (COREG) 12.5 MG  tablet Carvedilol 12.5 Mg- TAKE ONE TABLET BY MOUTH TWICE DAILY Do not take on dialysis days Tuesday Thursday Saturdays 180 tablet 3   cetirizine (ZYRTEC) 10 MG tablet Take 1 tablet (10 mg total) by mouth 2 (two) times daily. 180 tablet 1   Cholecalciferol 125 MCG (5000 UT) TABS Take 1 tablet (5,000 Units total) by mouth daily. 90 tablet 1   docusate sodium (COLACE) 100 MG capsule TAKE ONE CAPSULE BY MOUTH DAILY 90 capsule 0   EPINEPHrine 0.3 mg/0.3 mL IJ SOAJ injection Inject 0.3 mg into the muscle once as needed for up to 2 doses (if worsening tongue swelling, SOB, hypoxia, or other concerns for progressive anaphylaxis). 2 each 2   ethyl chloride spray SPRAY A SMALL AMOUNT THREE TIMES A WEEK JUST PRIOR TO NEEDLE INSERTION 116 mL  5   fluticasone (FLONASE) 50 MCG/ACT nasal spray INSTILL 1 SPRAY IN EACH NOSTRIL DAILY AS NEEDED 47.4 mL 0   hydrOXYzine (ATARAX) 25 MG tablet Take 1-2 tablets (25-50 mg total) by mouth at bedtime as needed for anxiety/insomnia. 60 tablet 1   LINZESS 72 MCG capsule TAKE 1 CAPSULE (72 MCG TOTAL) BY MOUTH EVERY MONDAY, WEDNESDAY, FRIDAY, AND SATURDAY. 30 capsule 1   mexiletine (MEXITIL) 150 MG capsule NEW PRESCRIPTION REQUEST: Mexiletine 150 Mg- TAKE TWO CAPSULES BY MOUTH TWICE DAILY 360 capsule 3   pantoprazole (PROTONIX) 40 MG tablet NEW PRESCRIPTION REQUEST: Pantoprazole Sod Dr 40 Mg - TAKE ONE TABLET BY MOUTH DAILY 90 tablet 3   Polyethylene Glycol 3350 (PEG 3350) 17 g PACK NEW PRESCRIPTION REQUEST: Polyethylene Glycol 3350- 1 PACKET BY MOUTH DAILY AS NEEDED 90 packet 0   predniSONE (DELTASONE) 10 MG tablet TAKE ONE TABLET BY MOUTH ONCE DAILY WITH BREAKFAST 30 tablet 6   sildenafil (VIAGRA) 50 MG tablet Take 50 mg by mouth daily as needed.     VELPHORO 500 MG chewable tablet Chew 1,000 mg by mouth 3 (three) times daily.     No current facility-administered medications for this visit.   Allergies  Allergen Reactions   Ace Inhibitors Swelling    Swelling of the tongue    Influenza Vaccines Hives and Swelling    SWELLING REACTION UNSPECIFIED    Ketorolac Swelling    SWELLING REACTION UNSPECIFIED    Lidocaine Swelling    TONGUE SWELLS   Lisinopril Swelling    TONGUE SWELLING Pt reported problem with a BP med which sounded like  lisinopril  But as of 09/19/06,pt had tolerated altace without problem   Penicillins Swelling    TONGUE SWELLS Has patient had a PCN reaction causing immediate rash, facial/tongue/throat swelling, SOB or lightheadedness with hypotension: Yes Has patient had a PCN reaction causing severe rash involving mucus membranes or skin necrosis: No Has patient had a PCN reaction that required hospitalization No Has patient had a PCN reaction occurring within the last 10 years: Yes If all of the above answers are "NO", then may proceed with Cephalosporin use.      Review of Systems: All systems reviewed and negative except where noted in HPI.   Lab Results  Component Value Date   WBC 6.4 01/19/2023   HGB 14.1 01/19/2023   HCT 44.1 01/19/2023   MCV 102.6 (H) 01/19/2023   PLT 206 01/19/2023    Lab Results  Component Value Date   CREATININE 10.64 (H) 10/26/2022   BUN 39 (H) 10/26/2022   NA 145 10/26/2022   K 3.9 10/26/2022   CL 98 10/26/2022   CO2 27 10/26/2022     Physical Exam: BP 110/68   Pulse 65   Ht '5\' 7"'$  (1.702 m)   Wt 252 lb 3.2 oz (114.4 kg)   SpO2 98%   BMI 39.50 kg/m  Constitutional: Pleasant,well-developed, male in no acute distress. DRE - CMA Tia Alert as standby - skin tags, no obvious anal fissure but exam limited due to discomfort, no mass lesions, unable to tolerate anoscopy Neurological: Alert and oriented to person place and time. Skin: Skin is warm and dry. No rashes noted. Psychiatric: Normal mood and affect. Behavior is normal.   ASSESSMENT: 62 y.o. male here for assessment of the following  1. History of colon polyps   2. Rectal bleeding   3. Hemorrhoids, unspecified hemorrhoid type    4. Constipation, unspecified constipation type   5.  Cardiac arrhythmia, unspecified cardiac arrhythmia type   6. Chronic combined systolic and diastolic CHF (congestive heart failure) ([place])   7. Anticoagulated    I suspect his superficial rectal bleeding is likely due to hemorrhoids.  I do not see an obvious fissure on exam today however his exam is somewhat limited due to pain.  He could not tolerate anoscopy.  His constipation appears to be adequately managed on his current regimen, he will continue Linzess and MiraLAX.  I gave him some free samples of Calmol 4 suppositories to treat empirically for hemorrhoids.  I let him know to contact me if this is not helping his symptoms and we may consider some empiric diltiazem with lidocaine ointment to treat possible underlying fissure.  I do not think any concerning polypoid or mass lesion given relatively recent exam.  Ultimately he is due for colonoscopy for his history of colon polyps.  We reviewed his significant cardiac history at length.  Hospitalization in September which led to cancellation of his procedure at that time.  He seems stable at this point in time, has been treated empirically with steroids, unclear if and when his cardiac PET will happen due to insurance issues.  I will reach out to his cardiologist to see if it is okay that we proceed with a colonoscopy in the next month or 2.  He is tentatively have this scheduled at [facility] in about 1 month in April.  If for some reason he has any issues with his heart before then he will let us know.  Will ask for approval to hold Eliquis for 2 days prior to the exam.  If for some reason he is not cleared to proceed obviously we will let him know in the interim.  He agrees, all questions answered.    PLAN: - will reach out to Cardiology - Dr. Aundra Dubin - to see if cleared for anesthesia for a colonoscopy. He is tentatively scheduled at [facility] in April to save a spot for this, will cancel if  not cleared - will need approval to hold Eliquis for 2 days pre-procedure - bleeding likely due to hemorrhoids although fissure remains possible. He has no pain at baseline but does not tolerate DRE / Anoscopy due to discomfort. CBC reassuring - will try Calmol4 suppositories PRN, free samples given. If no improvement he should contact us, will consider empiric diltiazem / lidocain ointment - continue Linzess and Miralax for constipation.  Jolly Mango, MD [facility]

## 2023-02-15 NOTE — Telephone Encounter (Signed)
Patient with diagnosis of afib on Eliquis for anticoagulation.    Procedure: colonoscopy Date of procedure: 03/17/23  CHA2DS2-VASc Score = 3  This indicates a 3.2% annual risk of stroke. The patient's score is based upon: CHF History: 1 HTN History: 1 Diabetes History: 0 Stroke History: 0 Vascular Disease History: 1 Age Score: 0 Gender Score: 0   CrCl - on dialysis Platelet count 206K  Per office protocol, patient can hold Eliquis for 2 days prior to procedure.    **This guidance is not considered finalized until pre-operative APP has relayed final recommendations.**

## 2023-02-15 NOTE — Telephone Encounter (Signed)
[facility] to schedule their annual wellness visit. Appointment made for 02/17/23.  Barkley Boards AWV direct phone # [phone]

## 2023-02-15 NOTE — Telephone Encounter (Signed)
Called and spoke to patient. He expressed understanding to hold Eliquis on 4-9 and 4-10 for 4-11 procedure.

## 2023-02-15 NOTE — Telephone Encounter (Signed)
Pharmacy please advise on holding Eliquis prior to colonoscopy scheduled for 03/17/2023. Thank you.

## 2023-02-15 NOTE — Telephone Encounter (Signed)
   [name]  DOB: [birth year] MRN: [id number]  Primary Cardiologist: Loralie Champagne, MD  Clinical pharmacists have reviewed the patient's past medical history, labs, and current medications as part of preoperative protocol coverage. The following recommendations have been made:   Per office protocol, patient can hold Eliquis for 2 days prior to procedure.     I will route this recommendation to the requesting party via Epic fax function and remove from pre-op pool.  Please call with questions.  Mable Fill, Marissa Nestle, NP 02/15/2023, 3:28 PM

## 2023-02-17 ENCOUNTER — Ambulatory Visit: Payer: 59 | Attending: Internal Medicine

## 2023-02-17 VITALS — Ht 67.0 in | Wt 252.0 lb

## 2023-02-17 DIAGNOSIS — L299 Pruritus, unspecified: Secondary | ICD-10-CM | POA: Diagnosis not present

## 2023-02-17 DIAGNOSIS — D631 Anemia in chronic kidney disease: Secondary | ICD-10-CM | POA: Diagnosis not present

## 2023-02-17 DIAGNOSIS — N186 End stage renal disease: Secondary | ICD-10-CM | POA: Diagnosis not present

## 2023-02-17 DIAGNOSIS — D509 Iron deficiency anemia, unspecified: Secondary | ICD-10-CM | POA: Diagnosis not present

## 2023-02-17 DIAGNOSIS — Z0000 Encounter for general adult medical examination without abnormal findings: Secondary | ICD-10-CM

## 2023-02-17 DIAGNOSIS — Z992 Dependence on renal dialysis: Secondary | ICD-10-CM | POA: Diagnosis not present

## 2023-02-17 DIAGNOSIS — T7840XA Allergy, unspecified, initial encounter: Secondary | ICD-10-CM | POA: Diagnosis not present

## 2023-02-17 DIAGNOSIS — N2581 Secondary hyperparathyroidism of renal origin: Secondary | ICD-10-CM | POA: Diagnosis not present

## 2023-02-17 NOTE — Progress Notes (Signed)
I connected with  Victor Thompson on 02/17/23 by a audio enabled telemedicine application and verified that I am speaking with the correct person using two identifiers.  Patient Location: Home  Provider Location: Office/Clinic  I discussed the limitations of evaluation and management by telemedicine. The patient expressed understanding and agreed to proceed.  Subjective:   Victor Thompson is a 62 y.o. male who presents for an Initial Medicare Annual Wellness Visit.  Review of Systems     Cardiac Risk Factors include: advanced age (>4mn, >>74women);dyslipidemia;hypertension;male gender;obesity (BMI >30kg/m2)     Objective:    Today's Vitals   02/17/23 1156  Weight: 252 lb (114.3 kg)  Height: '5\' 7"'$  (1.702 m)   Body mass index is 39.47 kg/m.     02/17/2023   12:00 PM 10/26/2022    9:01 PM 09/29/2022    1:28 AM 08/28/2022    9:58 AM 08/24/2022   10:32 AM 08/16/2022   11:44 AM 07/08/2021    8:56 AM  Advanced Directives  Does Patient Have a Medical Advance Directive? No No No No No No No  Would patient like information on creating a medical advance directive?   No - Patient declined No - Patient declined  No - Patient declined No - Patient declined    Current Medications (verified) Outpatient Encounter Medications as of 02/17/2023  Medication Sig   albuterol (PROVENTIL) (2.5 MG/3ML) 0.083% nebulizer solution INHALE THE CONTENTS OF 1 VIAL BY MOUTH EVERY 6 HOURS AS NEEDED   albuterol (VENTOLIN HFA) 108 (90 Base) MCG/ACT inhaler NEW PRESCRIPTION REQUEST: Albuterol Hfa 90 Mcg- INHALE TWO PUFFS BY MOUTH EVERY 4 HOURS AS NEEDED   amiodarone (PACERONE) 200 MG tablet Take 1 tablet (200 mg total) by mouth 2 (two) times daily.   apixaban (ELIQUIS) 5 MG TABS tablet NEW PRESCRIPTION REQUEST: Eliquis 5 Mg - TAKE ONE TABLET BY MOUTH TWICE DAILY   atorvastatin (LIPITOR) 80 MG tablet Take 1 tablet (80 mg total) by mouth at bedtime.   budesonide-formoterol (SYMBICORT) 80-4.5 MCG/ACT inhaler  INHALE TWO PUFFS INTO THE LUNGS TWICE DAILY (BULK)   calcium acetate (PHOSLO) 667 MG capsule Take 2 capsule by mouth three times a day with meals   carvedilol (COREG) 12.5 MG tablet Carvedilol 12.5 Mg- TAKE ONE TABLET BY MOUTH TWICE DAILY Do not take on dialysis days Tuesday Thursday Saturdays   cetirizine (ZYRTEC) 10 MG tablet Take 1 tablet (10 mg total) by mouth 2 (two) times daily.   Cholecalciferol 125 MCG (5000 UT) TABS Take 1 tablet (5,000 Units total) by mouth daily.   docusate sodium (COLACE) 100 MG capsule TAKE ONE CAPSULE BY MOUTH DAILY   EPINEPHrine 0.3 mg/0.3 mL IJ SOAJ injection Inject 0.3 mg into the muscle once as needed for up to 2 doses (if worsening tongue swelling, SOB, hypoxia, or other concerns for progressive anaphylaxis).   ethyl chloride spray SPRAY A SMALL AMOUNT THREE TIMES A WEEK JUST PRIOR TO NEEDLE INSERTION   fluticasone (FLONASE) 50 MCG/ACT nasal spray INSTILL 1 SPRAY IN EACH NOSTRIL DAILY AS NEEDED   hydrOXYzine (ATARAX) 25 MG tablet Take 1-2 tablets (25-50 mg total) by mouth at bedtime as needed for anxiety/insomnia.   LINZESS 72 MCG capsule TAKE 1 CAPSULE (72 MCG TOTAL) BY MOUTH EVERY MONDAY, WEDNESDAY, FRIDAY, AND SATURDAY.   mexiletine (MEXITIL) 150 MG capsule NEW PRESCRIPTION REQUEST: Mexiletine 150 Mg- TAKE TWO CAPSULES BY MOUTH TWICE DAILY   pantoprazole (PROTONIX) 40 MG tablet NEW PRESCRIPTION REQUEST: Pantoprazole Sod Dr 40  Mg - TAKE ONE TABLET BY MOUTH DAILY   predniSONE (DELTASONE) 10 MG tablet TAKE ONE TABLET BY MOUTH ONCE DAILY WITH BREAKFAST   Rectal Protectant-Emollient (CALMOL-4) 76-10 % SUPP Use as directed once to twice daily   sildenafil (VIAGRA) 50 MG tablet Take 50 mg by mouth daily as needed.   VELPHORO 500 MG chewable tablet Chew 1,000 mg by mouth 3 (three) times daily.   PEG-KCl-NaCl-NaSulf-Na Asc-C (PLENVU) 140 g SOLR Take 1 kit by mouth as directed.   Polyethylene Glycol 3350 (PEG 3350) 17 g PACK NEW PRESCRIPTION REQUEST: Polyethylene Glycol  3350- 1 PACKET BY MOUTH DAILY AS NEEDED   No facility-administered encounter medications on file as of 02/17/2023.    Allergies (verified) Ace inhibitors, Influenza vaccines, Ketorolac, Lidocaine, Lisinopril, and Penicillins   History: Past Medical History:  Diagnosis Date   Acute on chronic systolic CHF (congestive heart failure) ([place]) 06/14/2020   Acute respiratory failure ([place]) 05/28/2022   AICD (automatic cardioverter/defibrillator) present    2015   Allergy    Asthma    no meds   Cardiac arrest ([place]) 2015   Chest pain 05/25/2022   CHF (congestive heart failure) ([facility])    Coronary artery disease    ESRD on hemodialysis ([facility])    ckd -stage 5   Hyperlipidemia    Hypertension    Myocardial infarction ([place])    Obesity    Pneumonia    Shortness of breath dyspnea    Sleep apnea    USES CPAP   Wears glasses    Past Surgical History:  Procedure Laterality Date   AV FISTULA PLACEMENT Right 03/15/2019   Procedure: RIGHT ARM ARTERIOVENOUS (AV) FISTULA CREATION;  Surgeon: Angelia Mould, MD;  Location: [facility];  Service: Vascular;  Laterality: Right;   COLONOSCOPY     COLONOSCOPY W/ BIOPSIES AND POLYPECTOMY     CORONARY STENT PLACEMENT  2007   IMPLANTABLE CARDIOVERTER DEFIBRILLATOR IMPLANT  2015   RIGHT/LEFT HEART CATH AND CORONARY ANGIOGRAPHY N/A 07/08/2021   Procedure: RIGHT/LEFT HEART CATH AND CORONARY ANGIOGRAPHY;  Surgeon: Larey Dresser, MD;  Location: [facility];  Service: Cardiovascular;  Laterality: N/A;   RIGHT/LEFT HEART CATH AND CORONARY ANGIOGRAPHY N/A 08/25/2022   Procedure: RIGHT/LEFT HEART CATH AND CORONARY ANGIOGRAPHY;  Surgeon: Larey Dresser, MD;  Location: [facility];  Service: Cardiovascular;  Laterality: N/A;   SUBQ ICD CHANGEOUT N/A 10/13/2020   Procedure: SUBQ ICD CHANGEOUT;  Surgeon: Evans Lance, MD;  Location: [facility];  Service: Cardiovascular;  Laterality: N/A;   Family History  Problem Relation Age of Onset    Hypertension Mother    Liver disease Father    Colon cancer Neg Hx    Esophageal cancer Neg Hx    Rectal cancer Neg Hx    Stomach cancer Neg Hx    Social History   Socioeconomic History   Marital status: Single    Spouse name: Not on file   Number of children: 3   Years of education: Not on file   Highest education level: Not on file  Occupational History   Occupation: retired  Tobacco Use   Smoking status: Never   Smokeless tobacco: Never  Vaping Use   Vaping Use: Never used  Substance and Sexual Activity   Alcohol use: No   Drug use: No   Sexual activity: Yes  Other Topics Concern   Not on file  Social History Narrative   ** Merged History Encounter **  Social Determinants of Health   Financial Resource Strain: Low Risk  (02/17/2023)   Overall Financial Resource Strain (CARDIA)    Difficulty of Paying Living Expenses: Not hard at all  Food Insecurity: No Food Insecurity (02/17/2023)   Hunger Vital Sign    Worried About Running Out of Food in the Last Year: Never true    Ran Out of Food in the Last Year: Never true  Transportation Needs: No Transportation Needs (02/17/2023)   PRAPARE - Hydrologist (Medical): No    Lack of Transportation (Non-Medical): No  Physical Activity: Inactive (02/17/2023)   Exercise Vital Sign    Days of Exercise per Week: 0 days    Minutes of Exercise per Session: 0 min  Stress: No Stress Concern Present (02/17/2023)   [place]    Feeling of Stress : Only a little  Social Connections: Not on file    Tobacco Counseling Counseling given: Not Answered   Clinical Intake:  Pre-visit preparation completed: Yes  Pain : No/denies pain     Nutritional Status: BMI > 30  Obese Nutritional Risks: None Diabetes: No  How often do you need to have someone help you when you read instructions, pamphlets, or other written materials from your  doctor or pharmacy?: 1 - Never  Diabetic? no  Interpreter Needed?: No  Information entered by :: NAllen LPN   Activities of Daily Living    02/17/2023   12:00 PM 08/28/2022   10:00 AM  In your present state of health, do you have any difficulty performing the following activities:  Hearing? 0   Vision? 0   Difficulty concentrating or making decisions? 0   Walking or climbing stairs? 1   Dressing or bathing? 0   Doing errands, shopping? 0 0  Preparing Food and eating ? N   Using the Toilet? N   In the past six months, have you accidently leaked urine? N   Do you have problems with loss of bowel control? N   Managing your Medications? N   Managing your Finances? N   Housekeeping or managing your Housekeeping? N     Patient Care Team: [name], MD as PCP - General (Internal Medicine) Larey Dresser, MD as PCP - Cardiology (Cardiology) Evans Lance, MD as PCP - Electrophysiology (Cardiology) Dixie Dials, MD as Consulting Physician (Cardiology) Rex Kras, Claudette Stapler, RN as [place] any recent Medical Services you may have received from other than [facility] providers in the past year (date may be approximate).     Assessment:   This is a routine wellness examination for Muhammadali.  Hearing/Vision screen Vision Screening - Comments:: Regular eye exams, [facility]  Dietary issues and exercise activities discussed: Current Exercise Habits: The patient does not participate in regular exercise at present   Goals Addressed             This Visit's Progress    Patient Stated       02/17/2023, wants to lose stomach       Depression Screen    02/17/2023   12:00 PM 10/21/2022   11:07 AM 08/23/2022    9:41 AM 02/15/2022   10:20 AM 10/02/2021    8:44 AM 08/25/2021    2:40 PM 02/06/2021    9:41 AM  PHQ 2/9 Scores  PHQ - 2 Score 0 0 2 0  0 0  PHQ-  9 Score  4 2      Exception Documentation     Patient refusal       Fall Risk    02/17/2023   12:00 PM 10/21/2022   11:02 AM 09/16/2022    2:24 PM 07/15/2022    4:14 PM 06/18/2022    8:43 AM  Fall Risk   Falls in the past year? 0 0 0 0 0  Number falls in past yr: 0 0 0 0 0  Injury with Fall? 0 0 0 0 0  Risk for fall due to : Medication side effect No Fall Risks No Fall Risks  No Fall Risks  Follow up Falls prevention discussed;Education provided;Falls evaluation completed  Falls evaluation completed      FALL RISK PREVENTION PERTAINING TO THE HOME:  Any stairs in or around the home? Yes  If so, are there any without handrails? No  Home free of loose throw rugs in walkways, pet beds, electrical cords, etc? Yes  Adequate lighting in your home to reduce risk of falls? Yes   ASSISTIVE DEVICES UTILIZED TO PREVENT FALLS:  Life alert? No  Use of a cane, walker or w/c? No  Grab bars in the bathroom? No  Shower chair or bench in shower? No  Elevated toilet seat or a handicapped toilet? Yes   TIMED UP AND GO:  Was the test performed? No .      Cognitive Function:    02/15/2022   10:21 AM  MMSE - Mini Mental State Exam  Orientation to time 5  Orientation to Place 5  Registration 3  Attention/ Calculation 5  Recall 2  Language- name 2 objects 2  Language- repeat 1  Language- follow 3 step command 3  Language- read & follow direction 1  Write a sentence 1  Copy design 0  Total score 28        02/17/2023   12:02 PM  6CIT Screen  What Year? 0 points  What month? 0 points  What time? 0 points  Count back from 20 0 points  Months in reverse 4 points  Repeat phrase 4 points  Total Score 8 points    Immunizations Immunization History  Administered Date(s) Administered   Hepb-cpg 03/15/2022, 04/13/2022, 05/18/2022, 07/27/2022   Influenza Whole 11/08/2007, 09/10/2008   PFIZER(Purple Top)SARS-COV-2 Vaccination 02/27/2020, 03/23/2020   PPD Test 09/01/2016   Td 12/06/2004   Tdap 03/15/2016    TDAP status: Up to date  Flu  Vaccine status: Declined, Education has been provided regarding the importance of this vaccine but patient still declined. Advised may receive this vaccine at local pharmacy or Health Dept. Aware to provide a copy of the vaccination record if obtained from local pharmacy or Health Dept. Verbalized acceptance and understanding.  Pneumococcal vaccine status: Up to date  Covid-19 vaccine status: Completed vaccines  Qualifies for Shingles Vaccine? Yes   Zostavax completed No   Shingrix Completed?: No.    Education has been provided regarding the importance of this vaccine. Patient has been advised to call insurance company to determine out of pocket expense if they have not yet received this vaccine. Advised may also receive vaccine at local pharmacy or Health Dept. Verbalized acceptance and understanding.  Screening Tests Health Maintenance  Topic Date Due   Zoster Vaccines- Shingrix (1 of 2) Never done   COVID-19 Vaccine (3 - Pfizer risk series) 04/20/2020   COLONOSCOPY (Pts 45-93yr Insurance coverage will need to be confirmed)  02/01/2022   Medicare  Annual Wellness (AWV)  02/16/2023   INFLUENZA VACCINE  03/06/2023 (Originally 07/06/2022)   DTaP/Tdap/Td (3 - Td or Tdap) 03/15/2026   Hepatitis C Screening  Completed   HIV Screening  Completed   HPV VACCINES  Aged Out    Health Maintenance  Health Maintenance Due  Topic Date Due   Zoster Vaccines- Shingrix (1 of 2) Never done   COVID-19 Vaccine (3 - Pfizer risk series) 04/20/2020   COLONOSCOPY (Pts 45-50yr Insurance coverage will need to be confirmed)  02/01/2022   Medicare Annual Wellness (AWV)  02/16/2023    Colorectal cancer screening: Type of screening: Colonoscopy. Completed 02/01/2019. Repeat every 3 years  Lung Cancer Screening: (Low Dose CT Chest recommended if Age 70107-80years, 30 pack-year currently smoking OR have quit w/in 15years.) does not qualify.   Lung Cancer Screening Referral: no  Additional  Screening:  Hepatitis C Screening: does qualify; Completed 09/03/2022  Vision Screening: Recommended annual ophthalmology exams for early detection of glaucoma and other disorders of the eye. Is the patient up to date with their annual eye exam?  Yes  Who is the provider or what is the name of the office in which the patient attends annual eye exams? [facility] If pt is not established with a provider, would they like to be referred to a provider to establish care? No .   Dental Screening: Recommended annual dental exams for proper oral hygiene  Community Resource Referral / Chronic Care Management: CRR required this visit?  No   CCM required this visit?  No      Plan:     I have personally reviewed and noted the following in the patient's chart:   Medical and social history Use of alcohol, tobacco or illicit drugs  Current medications and supplements including opioid prescriptions. Patient is not currently taking opioid prescriptions. Functional ability and status Nutritional status Physical activity Advanced directives List of other physicians Hospitalizations, surgeries, and ER visits in previous 12 months Vitals Screenings to include cognitive, depression, and falls Referrals and appointments  In addition, I have reviewed and discussed with patient certain preventive protocols, quality metrics, and best practice recommendations. A written personalized care plan for preventive services as well as general preventive health recommendations were provided to patient.     NKellie Simmering LPN   [id number]  Nurse Notes: none  Due to this being a virtual visit, the after visit summary with patients personalized plan was offered to patient via mail or my-chart.  Patient would like to access on my-chart

## 2023-02-17 NOTE — Patient Instructions (Signed)
Victor Thompson , Thank you for taking time to come for your Medicare Wellness Visit. I appreciate your ongoing commitment to your health goals. Please review the following plan we discussed and let me know if I can assist you in the future.   These are the goals we discussed:  Goals       Blood Pressure < 140/90      Evaluate and treat hemorrhoids      Care Coordination Interventions: Evaluation of current treatment plan related to hemorrhoids and patient's adherence to plan as established by provider Determined patient is experiencing pain secondary to what he believes is a hemorrhoid Educated patient regarding use of ice packs over the affected area, 20 minutes on, 20 minutes off Instructed patient to discuss his symptoms with GI MD during upcoming visit to ask for recommendations  Reviewed scheduled/upcoming provider appointments including: GI consult with next Dr. Havery Moros scheduled for 02/15/23 '@1'$ :40 PM; PCP follow up appointment scheduled with Dr. [name] on 3/18 '@09'$ :10 AM         I want my breathing and chest soreness to get better (pt-stated)      Care Coordination Interventions: Evaluation of current treatment plan related to paroxsymal ventricular tachycardia and patient's adherence to plan as established by provider Determined patient completed a recent follow up with Cardiology Review of patient status, including review of consultant's reports, relevant laboratory and other test results, and medications completed Determined patient would like clarification on how/when to take his Carvedilol due to having his BP drop during his HD txts Sent in basket message to Cardiology provider Rafael Bihari, FNP advising of patient's hypotension during HD txts and requested consideration to have patient hold his Carvedilol on his off dialysis days in order to have a stable BP during txt In basket message received from [place] advising patient may take his Coreg on his off dialysis  days and hold on his dialysis days, patient informed and verbalizes understanding        LCSW Care Coordination-No Follow Up Required      Care Coordination Interventions: Solution-Focused Strategies employed:  Active listening / Reflection utilized  Emotional Support Provided Verbalization of feelings encouraged  Made referral to Care Guide to assist with listing of income-based housing Pt agreed to contact pharmacy to obtain medication refills Pt reports decrease in severity of pain symptoms and continues to take Tylenol  Pt would like a referral to Orthopedic. LCSW encouraged pt to address with PCP, next scheduled appt on 10/21/22 Pt is residing with family and reports difficulty obtaining independent housing. He is on [address] waiting list       Patient Stated      02/17/2023, wants to lose stomach      Weight (lb) < 200 lb (90.7 kg)      Patient has set a goal to lose 20 pounds by the end of this year.        This is a list of the screening recommended for you and due dates:  Health Maintenance  Topic Date Due   Zoster (Shingles) Vaccine (1 of 2) Never done   COVID-19 Vaccine (3 - Pfizer risk series) 04/20/2020   Colon Cancer Screening  02/01/2022   Flu Shot  03/06/2023*   Medicare Annual Wellness Visit  02/17/2024   DTaP/Tdap/Td vaccine (3 - Td or Tdap) 03/15/2026   Hepatitis C Screening: USPSTF Recommendation to screen - Ages 50-79 yo.  Completed   HIV Screening  Completed  HPV Vaccine  Aged Out  *Topic was postponed. The date shown is not the original due date.    Advanced directives: Advance directive discussed with you today. .  Conditions/risks identified: none  Next appointment: Follow up in one year for your annual wellness visit   Preventive Care 40-64 Years, Male Preventive care refers to lifestyle choices and visits with your health care provider that can promote health and wellness. What does preventive care include? A yearly physical exam. This is  also called an annual well check. Dental exams once or twice a year. Routine eye exams. Ask your health care provider how often you should have your eyes checked. Personal lifestyle choices, including: Daily care of your teeth and gums. Regular physical activity. Eating a healthy diet. Avoiding tobacco and drug use. Limiting alcohol use. Practicing safe sex. Taking low-dose aspirin every day starting at age 15. What happens during an annual well check? The services and screenings done by your health care provider during your annual well check will depend on your age, overall health, lifestyle risk factors, and family history of disease. Counseling  Your health care provider may ask you questions about your: Alcohol use. Tobacco use. Drug use. Emotional well-being. Home and relationship well-being. Sexual activity. Eating habits. Work and work Statistician. Screening  You may have the following tests or measurements: Height, weight, and BMI. Blood pressure. Lipid and cholesterol levels. These may be checked every 5 years, or more frequently if you are over 28 years old. Skin check. Lung cancer screening. You may have this screening every year starting at age 78 if you have a 30-pack-year history of smoking and currently smoke or have quit within the past 15 years. Fecal occult blood test (FOBT) of the stool. You may have this test every year starting at age 73. Flexible sigmoidoscopy or colonoscopy. You may have a sigmoidoscopy every 5 years or a colonoscopy every 10 years starting at age 70. Prostate cancer screening. Recommendations will vary depending on your family history and other risks. Hepatitis C blood test. Hepatitis B blood test. Sexually transmitted disease (STD) testing. Diabetes screening. This is done by checking your blood sugar (glucose) after you have not eaten for a while (fasting). You may have this done every 1-3 years. Discuss your test results, treatment  options, and if necessary, the need for more tests with your health care provider. Vaccines  Your health care provider may recommend certain vaccines, such as: Influenza vaccine. This is recommended every year. Tetanus, diphtheria, and acellular pertussis (Tdap, Td) vaccine. You may need a Td booster every 10 years. Zoster vaccine. You may need this after age 43. Pneumococcal 13-valent conjugate (PCV13) vaccine. You may need this if you have certain conditions and have not been vaccinated. Pneumococcal polysaccharide (PPSV23) vaccine. You may need one or two doses if you smoke cigarettes or if you have certain conditions. Talk to your health care provider about which screenings and vaccines you need and how often you need them. This information is not intended to replace advice given to you by your health care provider. Make sure you discuss any questions you have with your health care provider. Document Released: 12/19/2015 Document Revised: 08/11/2016 Document Reviewed: 09/23/2015 Elsevier Interactive Patient Education  2017 [employer] Prevention in the Home Falls can cause injuries. They can happen to people of all ages. There are many things you can do to make your home safe and to help prevent falls. What can I do on the  outside of my home? Regularly fix the edges of walkways and driveways and fix any cracks. Remove anything that might make you trip as you walk through a door, such as a raised step or threshold. Trim any bushes or trees on the path to your home. Use bright outdoor lighting. Clear any walking paths of anything that might make someone trip, such as rocks or tools. Regularly check to see if handrails are loose or broken. Make sure that both sides of any steps have handrails. Any raised decks and porches should have guardrails on the edges. Have any leaves, snow, or ice cleared regularly. Use sand or salt on walking paths during winter. Clean up any spills in your  garage right away. This includes oil or grease spills. What can I do in the bathroom? Use night lights. Install grab bars by the toilet and in the tub and shower. Do not use towel bars as grab bars. Use non-skid mats or decals in the tub or shower. If you need to sit down in the shower, use a plastic, non-slip stool. Keep the floor dry. Clean up any water that spills on the floor as soon as it happens. Remove soap buildup in the tub or shower regularly. Attach bath mats securely with double-sided non-slip rug tape. Do not have throw rugs and other things on the floor that can make you trip. What can I do in the bedroom? Use night lights. Make sure that you have a light by your bed that is easy to reach. Do not use any sheets or blankets that are too big for your bed. They should not hang down onto the floor. Have a firm chair that has side arms. You can use this for support while you get dressed. Do not have throw rugs and other things on the floor that can make you trip. What can I do in the kitchen? Clean up any spills right away. Avoid walking on wet floors. Keep items that you use a lot in easy-to-reach places. If you need to reach something above you, use a strong step stool that has a grab bar. Keep electrical cords out of the way. Do not use floor polish or wax that makes floors slippery. If you must use wax, use non-skid floor wax. Do not have throw rugs and other things on the floor that can make you trip. What can I do with my stairs? Do not leave any items on the stairs. Make sure that there are handrails on both sides of the stairs and use them. Fix handrails that are broken or loose. Make sure that handrails are as long as the stairways. Check any carpeting to make sure that it is firmly attached to the stairs. Fix any carpet that is loose or worn. Avoid having throw rugs at the top or bottom of the stairs. If you do have throw rugs, attach them to the floor with carpet  tape. Make sure that you have a light switch at the top of the stairs and the bottom of the stairs. If you do not have them, ask someone to add them for you. What else can I do to help prevent falls? Wear shoes that: Do not have high heels. Have rubber bottoms. Are comfortable and fit you well. Are closed at the toe. Do not wear sandals. If you use a stepladder: Make sure that it is fully opened. Do not climb a closed stepladder. Make sure that both sides of the stepladder are locked into  place. Ask someone to hold it for you, if possible. Clearly mark and make sure that you can see: Any grab bars or handrails. First and last steps. Where the edge of each step is. Use tools that help you move around (mobility aids) if they are needed. These include: Canes. Walkers. Scooters. Crutches. Turn on the lights when you go into a dark area. Replace any light bulbs as soon as they burn out. Set up your furniture so you have a clear path. Avoid moving your furniture around. If any of your floors are uneven, fix them. If there are any pets around you, be aware of where they are. Review your medicines with your doctor. Some medicines can make you feel dizzy. This can increase your chance of falling. Ask your doctor what other things that you can do to help prevent falls. This information is not intended to replace advice given to you by your health care provider. Make sure you discuss any questions you have with your health care provider. Document Released: 09/18/2009 Document Revised: 04/29/2016 Document Reviewed: 12/27/2014 Elsevier Interactive Patient Education  2017 [employer].

## 2023-02-18 ENCOUNTER — Other Ambulatory Visit: Payer: Self-pay | Admitting: Internal Medicine

## 2023-02-18 ENCOUNTER — Other Ambulatory Visit: Payer: Self-pay | Admitting: Cardiology

## 2023-02-18 DIAGNOSIS — I5042 Chronic combined systolic (congestive) and diastolic (congestive) heart failure: Secondary | ICD-10-CM

## 2023-02-18 DIAGNOSIS — I2510 Atherosclerotic heart disease of native coronary artery without angina pectoris: Secondary | ICD-10-CM

## 2023-02-19 DIAGNOSIS — D631 Anemia in chronic kidney disease: Secondary | ICD-10-CM | POA: Diagnosis not present

## 2023-02-19 DIAGNOSIS — D509 Iron deficiency anemia, unspecified: Secondary | ICD-10-CM | POA: Diagnosis not present

## 2023-02-19 DIAGNOSIS — Z992 Dependence on renal dialysis: Secondary | ICD-10-CM | POA: Diagnosis not present

## 2023-02-19 DIAGNOSIS — N2581 Secondary hyperparathyroidism of renal origin: Secondary | ICD-10-CM | POA: Diagnosis not present

## 2023-02-19 DIAGNOSIS — L299 Pruritus, unspecified: Secondary | ICD-10-CM | POA: Diagnosis not present

## 2023-02-19 DIAGNOSIS — T7840XA Allergy, unspecified, initial encounter: Secondary | ICD-10-CM | POA: Diagnosis not present

## 2023-02-19 DIAGNOSIS — N186 End stage renal disease: Secondary | ICD-10-CM | POA: Diagnosis not present

## 2023-02-20 DIAGNOSIS — G4733 Obstructive sleep apnea (adult) (pediatric): Secondary | ICD-10-CM | POA: Diagnosis not present

## 2023-02-21 ENCOUNTER — Ambulatory Visit: Payer: 59 | Attending: Internal Medicine | Admitting: Internal Medicine

## 2023-02-21 ENCOUNTER — Other Ambulatory Visit: Payer: Self-pay

## 2023-02-21 VITALS — BP 123/73 | HR 69 | Temp 97.9°F | Ht 67.0 in | Wt 252.0 lb

## 2023-02-21 DIAGNOSIS — Z992 Dependence on renal dialysis: Secondary | ICD-10-CM | POA: Diagnosis not present

## 2023-02-21 DIAGNOSIS — I5042 Chronic combined systolic (congestive) and diastolic (congestive) heart failure: Secondary | ICD-10-CM | POA: Insufficient documentation

## 2023-02-21 DIAGNOSIS — G4733 Obstructive sleep apnea (adult) (pediatric): Secondary | ICD-10-CM | POA: Insufficient documentation

## 2023-02-21 DIAGNOSIS — Z6839 Body mass index (BMI) 39.0-39.9, adult: Secondary | ICD-10-CM

## 2023-02-21 DIAGNOSIS — Z79899 Other long term (current) drug therapy: Secondary | ICD-10-CM | POA: Insufficient documentation

## 2023-02-21 DIAGNOSIS — N2581 Secondary hyperparathyroidism of renal origin: Secondary | ICD-10-CM | POA: Insufficient documentation

## 2023-02-21 DIAGNOSIS — I132 Hypertensive heart and chronic kidney disease with heart failure and with stage 5 chronic kidney disease, or end stage renal disease: Secondary | ICD-10-CM | POA: Diagnosis not present

## 2023-02-21 DIAGNOSIS — Z8249 Family history of ischemic heart disease and other diseases of the circulatory system: Secondary | ICD-10-CM | POA: Insufficient documentation

## 2023-02-21 DIAGNOSIS — N186 End stage renal disease: Secondary | ICD-10-CM | POA: Insufficient documentation

## 2023-02-21 DIAGNOSIS — F419 Anxiety disorder, unspecified: Secondary | ICD-10-CM | POA: Diagnosis not present

## 2023-02-21 DIAGNOSIS — G8929 Other chronic pain: Secondary | ICD-10-CM | POA: Diagnosis not present

## 2023-02-21 DIAGNOSIS — Z7901 Long term (current) use of anticoagulants: Secondary | ICD-10-CM | POA: Insufficient documentation

## 2023-02-21 DIAGNOSIS — R7303 Prediabetes: Secondary | ICD-10-CM | POA: Insufficient documentation

## 2023-02-21 DIAGNOSIS — Z8674 Personal history of sudden cardiac arrest: Secondary | ICD-10-CM | POA: Insufficient documentation

## 2023-02-21 DIAGNOSIS — J449 Chronic obstructive pulmonary disease, unspecified: Secondary | ICD-10-CM | POA: Diagnosis not present

## 2023-02-21 DIAGNOSIS — I2510 Atherosclerotic heart disease of native coronary artery without angina pectoris: Secondary | ICD-10-CM | POA: Diagnosis not present

## 2023-02-21 DIAGNOSIS — I480 Paroxysmal atrial fibrillation: Secondary | ICD-10-CM

## 2023-02-21 DIAGNOSIS — Z7952 Long term (current) use of systemic steroids: Secondary | ICD-10-CM | POA: Diagnosis not present

## 2023-02-21 NOTE — Progress Notes (Unsigned)
Patient ID: Victor Thompson, male    DOB: [birth year]  MRN: [id number]  CC: Follow-up (Chronic sytolic heart failure & HTN f/u. /Discuss hydroxyzine - pt unsure of frequency/No to flu vax. )   Subjective: Victor Thompson is a 62 y.o. male who presents for chronic ds management His concerns today include:  Pt with hx of ESRD on hemodialysis (Dr. Joelyn Thompson), HTN, CAD (with hx of cardiac arrest), ICD deactivated 08/2022 due to VT storm with recurrent shocks, A.fib, combined sys/dia CHF (EF 20-25% 08/2022 and 10/2021), HL, OSA on CPAP, Obese, COPD, chronic lower back pain with multilevel spondylosis.    Rectal Bleeding:  saw Dr. Kandace Thompson CHF/possible cardiac sarcoid/A.fib: Flutters at times.  No CP, increase SOB  COPD:  using Symbicort BID; using Albuterol sparingly  ESRD:  on HD  Patient Active Problem List   Diagnosis Date Noted   ICD (implantable cardioverter-defibrillator) in place 11/05/2022   Paroxysmal atrial fibrillation (HCC)    Nausea and vomiting    Paroxysmal ventricular tachycardia (Victor Thompson) 08/24/2022   VT (ventricular tachycardia) (Victor Thompson) 08/16/2022   Chronic obstructive pulmonary disease, unspecified COPD type (Victor Thompson) 06/18/2022   Acute respiratory failure with hypoxia ([facility]) 05/28/2022   AICD discharge 05/25/2022   Seasonal and perennial allergic rhinitis 11/12/2021   Moderate persistent asthma, uncomplicated 0000000   STD exposure 08/27/2021   ESRD on dialysis (Victor Thompson) 02/06/2021   Hypervolemia associated with renal insufficiency 01/12/2021   Angioedema 09/22/2020   Acute on chronic HFrEF (heart failure with reduced ejection fraction) (Victor Thompson) 06/14/2020   Secondary hyperparathyroidism of renal origin (Victor Thompson) 07/15/2019   Vitamin D deficiency 07/15/2019   CAD (coronary artery disease), native coronary artery 04/10/2019   Chronic midline low back pain without sciatica 02/17/2018   Ventricular tachycardia (Victor Thompson) 03/29/2016   Defibrillator discharge    HFrEF (heart failure  with reduced ejection fraction) (Victor Thompson) 01/22/2016   Erectile dysfunction 01/22/2016   Allergic rhinitis 01/22/2016   GERD (gastroesophageal reflux disease) 01/22/2016   PROTEINURIA 02/17/2010   Dilated cardiomyopathy (Victor Thompson) 02/16/2010   Obstructive sleep apnea 02/16/2010   EXTERNAL HEMORRHOIDS 09/26/2009   Tobacco use disorder in remission 06/03/2009   Essential hypertension 10/01/2008   Hyperlipidemia 07/14/2007   Class 3 severe obesity due to excess calories with serious comorbidity and body mass index (BMI) of 40.0 to 44.9 in adult Victor Thompson) 07/14/2007   Asthma 07/14/2007     Current Outpatient Medications on File Prior to Visit  Medication Sig Dispense Refill   albuterol (PROVENTIL) (2.5 MG/3ML) 0.083% nebulizer solution INHALE THE CONTENTS OF 1 VIAL BY MOUTH EVERY 6 HOURS AS NEEDED 675 mL 0   albuterol (VENTOLIN HFA) 108 (90 Base) MCG/ACT inhaler NEW PRESCRIPTION REQUEST: Albuterol Hfa 90 Mcg- INHALE TWO PUFFS BY MOUTH EVERY 4 HOURS AS NEEDED 60.3 g 6   amiodarone (PACERONE) 200 MG tablet Take 1 tablet (200 mg total) by mouth 2 (two) times daily. 60 tablet 2   apixaban (ELIQUIS) 5 MG TABS tablet NEW PRESCRIPTION REQUEST: Eliquis 5 Mg - TAKE ONE TABLET BY MOUTH TWICE DAILY 180 tablet 3   atorvastatin (LIPITOR) 80 MG tablet Take 1 tablet (80 mg total) by mouth at bedtime. 90 tablet 3   budesonide-formoterol (SYMBICORT) 80-4.5 MCG/ACT inhaler INHALE TWO PUFFS INTO THE LUNGS TWICE DAILY (BULK) 10.2 g 6   calcium acetate (PHOSLO) 667 MG capsule Take 2 capsule by mouth three times a day with meals 180 capsule 6   carvedilol (COREG) 12.5 MG tablet Carvedilol 12.5 Mg- TAKE ONE  TABLET BY MOUTH TWICE DAILY Do not take on dialysis days Tuesday Thursday Saturdays 180 tablet 3   cetirizine (ZYRTEC) 10 MG tablet Take 1 tablet (10 mg total) by mouth 2 (two) times daily. 180 tablet 1   Cholecalciferol 125 MCG (5000 UT) TABS Take 1 tablet (5,000 Units total) by mouth daily. 90 tablet 1   docusate sodium  (COLACE) 100 MG capsule TAKE ONE CAPSULE BY MOUTH DAILY 90 capsule 0   EPINEPHrine 0.3 mg/0.3 mL IJ SOAJ injection Inject 0.3 mg into the muscle once as needed for up to 2 doses (if worsening tongue swelling, SOB, hypoxia, or other concerns for progressive anaphylaxis). 2 each 2   ethyl chloride spray SPRAY A SMALL AMOUNT THREE TIMES A WEEK JUST PRIOR TO NEEDLE INSERTION 116 mL 5   fluticasone (FLONASE) 50 MCG/ACT nasal spray INSTILL 1 SPRAY IN EACH NOSTRIL DAILY AS NEEDED 47.4 mL 0   hydrOXYzine (ATARAX) 25 MG tablet Take 1-2 tablets (25-50 mg total) by mouth at bedtime as needed for anxiety/insomnia. 60 tablet 1   LINZESS 72 MCG capsule TAKE 1 CAPSULE (72 MCG TOTAL) BY MOUTH EVERY MONDAY, WEDNESDAY, FRIDAY, AND SATURDAY. 30 capsule 1   mexiletine (MEXITIL) 150 MG capsule NEW PRESCRIPTION REQUEST: Mexiletine 150 Mg- TAKE TWO CAPSULES BY MOUTH TWICE DAILY 360 capsule 3   pantoprazole (PROTONIX) 40 MG tablet NEW PRESCRIPTION REQUEST: Pantoprazole Sod Dr 40 Mg - TAKE ONE TABLET BY MOUTH DAILY 90 tablet 3   PEG-KCl-NaCl-NaSulf-Na Asc-C (PLENVU) 140 g SOLR Take 1 kit by mouth as directed. 3 each 0   Polyethylene Glycol 3350 (PEG 3350) 17 g PACK NEW PRESCRIPTION REQUEST: Polyethylene Glycol 3350- 1 PACKET BY MOUTH DAILY AS NEEDED 90 packet 0   predniSONE (DELTASONE) 10 MG tablet TAKE ONE TABLET BY MOUTH ONCE DAILY WITH BREAKFAST 30 tablet 6   Rectal Protectant-Emollient (CALMOL-4) 76-10 % SUPP Use as directed once to twice daily 8 suppository 0   sildenafil (VIAGRA) 50 MG tablet Take 50 mg by mouth daily as needed.     VELPHORO 500 MG chewable tablet Chew 1,000 mg by mouth 3 (three) times daily.     No current facility-administered medications on file prior to visit.    Allergies  Allergen Reactions   Ace Inhibitors Swelling    Swelling of the tongue   Influenza Vaccines Hives and Swelling    SWELLING REACTION UNSPECIFIED    Ketorolac Swelling    SWELLING REACTION UNSPECIFIED    Lidocaine  Swelling    TONGUE SWELLS   Lisinopril Swelling    TONGUE SWELLING Pt reported problem with a BP med which sounded like  lisinopril  But as of 09/19/06,pt had tolerated altace without problem   Penicillins Swelling    TONGUE SWELLS Has patient had a PCN reaction causing immediate rash, facial/tongue/throat swelling, SOB or lightheadedness with hypotension: Yes Has patient had a PCN reaction causing severe rash involving mucus membranes or skin necrosis: No Has patient had a PCN reaction that required hospitalization No Has patient had a PCN reaction occurring within the last 10 years: Yes If all of the above answers are "NO", then may proceed with Cephalosporin use.     Social History   Socioeconomic History   Marital status: Single    Spouse name: Not on file   Number of children: 3   Years of education: Not on file   Highest education level: Not on file  Occupational History   Occupation: retired  Tobacco Use   Smoking  status: Never   Smokeless tobacco: Never  Vaping Use   Vaping Use: Never used  Substance and Sexual Activity   Alcohol use: No   Drug use: No   Sexual activity: Yes  Other Topics Concern   Not on file  Social History Narrative   ** Merged History Encounter **       Social Determinants of Health   Financial Resource Strain: Low Risk  (02/17/2023)   Overall Financial Resource Strain (CARDIA)    Difficulty of Paying Living Expenses: Not hard at all  Food Insecurity: No Food Insecurity (02/17/2023)   Hunger Vital Sign    Worried About Running Out of Food in the Last Year: Never true    Ran Out of Food in the Last Year: Never true  Transportation Needs: No Transportation Needs (02/17/2023)   PRAPARE - Hydrologist (Medical): No    Lack of Transportation (Non-Medical): No  Physical Activity: Inactive (02/17/2023)   Exercise Vital Sign    Days of Exercise per Week: 0 days    Minutes of Exercise per Session: 0 min  Stress: No  Stress Concern Present (02/17/2023)   Victor Thompson    Feeling of Stress : Only a little  Social Connections: Not on file  Intimate Partner Violence: Not At Risk (08/28/2022)   Humiliation, Afraid, Rape, and Kick questionnaire    Fear of Current or Ex-Partner: No    Emotionally Abused: No    Physically Abused: No    Sexually Abused: No    Family History  Problem Relation Age of Onset   Hypertension Mother    Liver disease Father    Colon cancer Neg Hx    Esophageal cancer Neg Hx    Rectal cancer Neg Hx    Stomach cancer Neg Hx     Past Surgical History:  Procedure Laterality Date   AV FISTULA PLACEMENT Right 03/15/2019   Procedure: RIGHT ARM ARTERIOVENOUS (AV) FISTULA CREATION;  Surgeon: Angelia Mould, MD;  Location: [facility];  Service: Vascular;  Laterality: Right;   COLONOSCOPY     COLONOSCOPY W/ BIOPSIES AND POLYPECTOMY     CORONARY STENT PLACEMENT  2007   IMPLANTABLE CARDIOVERTER DEFIBRILLATOR IMPLANT  2015   RIGHT/LEFT HEART CATH AND CORONARY ANGIOGRAPHY N/A 07/08/2021   Procedure: RIGHT/LEFT HEART CATH AND CORONARY ANGIOGRAPHY;  Surgeon: Larey Dresser, MD;  Location: [facility];  Service: Cardiovascular;  Laterality: N/A;   RIGHT/LEFT HEART CATH AND CORONARY ANGIOGRAPHY N/A 08/25/2022   Procedure: RIGHT/LEFT HEART CATH AND CORONARY ANGIOGRAPHY;  Surgeon: Larey Dresser, MD;  Location: [facility];  Service: Cardiovascular;  Laterality: N/A;   SUBQ ICD CHANGEOUT N/A 10/13/2020   Procedure: SUBQ ICD CHANGEOUT;  Surgeon: Evans Lance, MD;  Location: [facility];  Service: Cardiovascular;  Laterality: N/A;    ROS: Review of Systems Negative except as stated above  PHYSICAL EXAM: BP 123/73 (BP Location: Left Arm, Patient Position: Sitting, Cuff Size: Normal)   Pulse 69   Temp 97.9 F (36.6 C) (Oral)   Ht 5\' 7"  (1.702 m)   Wt 252 lb (114.3 kg)   SpO2 95%   BMI 39.47 kg/m   Wt  Readings from Last 3 Encounters:  02/21/23 252 lb (114.3 kg)  02/17/23 252 lb (114.3 kg)  02/15/23 252 lb 3.2 oz (114.4 kg)    Physical Exam  {male adult master:310786} {male adult master:310785}  Latest Ref Rng & Units 01/19/2023   11:44 AM 10/26/2022    9:25 PM 09/29/2022    1:36 AM  CMP  Glucose 70 - 99 mg/dL  111  107   BUN 6 - 20 mg/dL  39  29   Creatinine 0.61 - 1.24 mg/dL  10.64  7.98   Sodium 135 - 145 mmol/L  145  143   Potassium 3.5 - 5.1 mmol/L  3.9  3.8   Chloride 98 - 111 mmol/L  98  97   CO2 22 - 32 mmol/L  27  29   Calcium 8.9 - 10.3 mg/dL  8.1  7.8   Total Protein 6.5 - 8.1 g/dL 6.4     Total Bilirubin 0.3 - 1.2 mg/dL 0.3     Alkaline Phos 38 - 126 U/L 72     AST 15 - 41 U/L 40     ALT 0 - 44 U/L 74      Lipid Panel     Component Value Date/Time   CHOL 230 (H) 08/16/2022 1004   TRIG 47 09/01/2022 0354   HDL 27 (L) 08/16/2022 1004   CHOLHDL 8.5 08/16/2022 1004   VLDL 39 08/16/2022 1004   LDLCALC 164 (H) 08/16/2022 1004    CBC    Component Value Date/Time   WBC 6.4 01/19/2023 1144   RBC 4.30 01/19/2023 1144   HGB 14.1 01/19/2023 1144   HGB 12.0 (L) 09/08/2021 1450   HCT 44.1 01/19/2023 1144   HCT 35.4 (L) 09/08/2021 1450   PLT 206 01/19/2023 1144   PLT 179 10/10/2020 0925   MCV 102.6 (H) 01/19/2023 1144   MCV 95 09/08/2021 1450   MCH 32.8 01/19/2023 1144   MCHC 32.0 01/19/2023 1144   RDW 16.7 (H) 01/19/2023 1144   RDW 14.2 09/08/2021 1450   LYMPHSABS 0.6 (L) 09/29/2022 0136   LYMPHSABS 0.8 09/08/2021 1450   MONOABS 0.8 09/29/2022 0136   EOSABS 0.0 09/29/2022 0136   EOSABS 0.3 09/08/2021 1450   BASOSABS 0.0 09/29/2022 0136   BASOSABS 0.0 09/08/2021 1450    ASSESSMENT AND PLAN:  There are no diagnoses linked to this encounter.   Patient was given the opportunity to ask questions.  Patient verbalized understanding of the plan and was able to repeat key elements of the plan.   This documentation was completed using Chief Operating Officer.  Any transcriptional errors are unintentional.  No orders of the defined types were placed in this encounter.    Requested Prescriptions    No prescriptions requested or ordered in this encounter    No follow-ups on file.  Karle Plumber, MD, FACP

## 2023-02-21 NOTE — Patient Instructions (Signed)

## 2023-02-22 ENCOUNTER — Other Ambulatory Visit (HOSPITAL_COMMUNITY): Payer: Self-pay

## 2023-02-22 ENCOUNTER — Other Ambulatory Visit: Payer: Self-pay

## 2023-02-22 ENCOUNTER — Other Ambulatory Visit: Payer: Self-pay | Admitting: *Deleted

## 2023-02-22 DIAGNOSIS — L299 Pruritus, unspecified: Secondary | ICD-10-CM | POA: Diagnosis not present

## 2023-02-22 DIAGNOSIS — Z992 Dependence on renal dialysis: Secondary | ICD-10-CM | POA: Diagnosis not present

## 2023-02-22 DIAGNOSIS — T7840XA Allergy, unspecified, initial encounter: Secondary | ICD-10-CM | POA: Diagnosis not present

## 2023-02-22 DIAGNOSIS — D509 Iron deficiency anemia, unspecified: Secondary | ICD-10-CM | POA: Diagnosis not present

## 2023-02-22 DIAGNOSIS — N186 End stage renal disease: Secondary | ICD-10-CM | POA: Diagnosis not present

## 2023-02-22 DIAGNOSIS — D631 Anemia in chronic kidney disease: Secondary | ICD-10-CM | POA: Diagnosis not present

## 2023-02-22 DIAGNOSIS — N2581 Secondary hyperparathyroidism of renal origin: Secondary | ICD-10-CM | POA: Diagnosis not present

## 2023-02-22 LAB — HEMOGLOBIN A1C
Est. average glucose Bld gHb Est-mCnc: 128 mg/dL
Hgb A1c MFr Bld: 6.1 % — ABNORMAL HIGH (ref 4.8–5.6)

## 2023-02-22 MED ORDER — ALBUTEROL SULFATE (2.5 MG/3ML) 0.083% IN NEBU: INHALATION_SOLUTION | RESPIRATORY_TRACT | 0 refills | Status: DC

## 2023-02-22 MED ORDER — TADALAFIL 10 MG PO TABS
10.0000 mg | ORAL_TABLET | Freq: Every day | ORAL | 1 refills | Status: DC | PRN
  Filled 2023-02-22: qty 30, 30d supply, fill #0

## 2023-02-23 NOTE — Progress Notes (Signed)
HPI male former smoker followed for Angioedema, OSA, complicated by CKD, HBP Unattended Home Sleep Test 05/18/2016-AHI 60.7/hour, desaturation to 79%, body weight 289.5 pounds  ===============================================================   10/07/22- 61 yoM former smoker followed for OSA, Asthma, complicated by hx Angioedema, CKD,/ ESRD/Dialysis,  HBP, CAD/M/ AICD/ VTach, CHF, CM, Renal Hyperparathyroidism, Morbid Obesity, Sarcoid, -Symbicort 80, Ventolin hfa, Neb albuterol,  Epworth score -2 CPAP- 5-20/ [employer]         Luna machine auto Download- compliance  - not available Body weight today- Covid vax-2 Phizer Flu vax- none Hosp [id number] for complicated V. tach.  His AICD has been turned off because it was shocking him too much.  He continues hemodialysis.  During hospitalization suspicion was raised of possible cardiac sarcoid and he was put on prednisone which continues.  He had never heard of sarcoid and it's not on his problem list.  ACE level was normal 1 month ago and 6 years ago. We reviewed his CPAP situation.  His machine is beginning to make noise when he runs and [employer] has told him it is "on its last legs", due for replacement.  Previously he has used it consistently. CXR 09/29/22-  IMPRESSION: Mild vascular congestion.  No other focal abnormality is noted.  02/25/23- 61 yoM former smoker followed for OSA, Asthma, complicated by hx Angioedema, CKD,/ ESRD/Dialysis,  HBP, CAD/M/ AICD/ VTach, CHF, CM, Renal Hyperparathyroidism, Morbid Obesity, ? Cardiac Sarcoid, -Symbicort 80, Ventolin hfa, Neb albuterol, Prednisone 10 mg daily, Epworth score -2 CPAP- 5-20/ [employer]         Luna machine auto ([employer] compliance report) Download- compliance  - 96.7%, AHI 2.2/ hr Body weight today-253 lbs Covid vax-2 Phizer Flu vax- none -----Patient advises no issues with cpap machine  Download reviewed.  He feels he is doing well with his CPAP and sleeps much better using it.  No specific  complaints. Breathing is comfortable.  He is dealing with a lot, recognizing his cardiovascular problems and his hemodialysis, and addition to CPAP. CXR 10/26/22-  MPRESSION: No active cardiopulmonary disease.  ROS-see HPI  + = positive Constitutional:    weight loss, night sweats, fevers, chills, fatigue, lassitude. HEENT:    headaches, difficulty swallowing, tooth/dental problems, sore throat,       sneezing, itching, ear ache, nasal congestion, post nasal drip, snoring CV:    chest pain, orthopnea, PND, swelling in lower extremities, anasarca,                                                        dizziness, palpitations Resp:   shortness of breath with exertion or at rest.                productive cough,   non-productive cough, coughing up of blood.              change in color of mucus.  wheezing.   Skin:   Clear GI:  No-   heartburn, indigestion, abdominal pain, nausea, vomiting, diarrhea,                 change in bowel habits, loss of appetite GU: dysuria, change in color of urine, no urgency or frequency.   flank pain. MS:   joint pain, stiffness, decreased range of motion, back pain. Neuro-     nothing unusual Psych:  change in mood or affect.  depression or anxiety.   memory loss.  OBJ- Physical Exam General- Alert, Oriented, Affect-appropriate, Distress- none acute, + obese Skin-clear Lymphadenopathy- none Head- atraumatic            Eyes- Gross vision intact, PERRLA, conjunctivae and secretions clear            Ears- Hearing, canals-normal            Nose- Clear, no-Septal dev, mucus, polyps, erosion, perforation             Throat- Mallampati IV , mucosa clear , drainage- none, tonsils- atrophic Neck- flexible , trachea midline, no stridor , thyroid nl, carotid no bruit Chest - symmetrical excursion , unlabored           Heart/CV- RRR/ faint , no murmur , no gallop  , no rub, nl s1 s2                           - JVD- none , edema- none, stasis changes- none, varices-  none           Lung- clear to P&A, wheeze- none, cough- none , dullness-none, rub- none           Chest wall-  Abd-  Br/ Gen/ Rectal- Not done, not indicated Extrem- cyanosis- none, clubbing, none, atrophy- none, strength- nl Neuro- grossly intact to observation,

## 2023-02-24 ENCOUNTER — Encounter: Payer: Self-pay | Admitting: Internal Medicine

## 2023-02-24 DIAGNOSIS — L299 Pruritus, unspecified: Secondary | ICD-10-CM | POA: Diagnosis not present

## 2023-02-24 DIAGNOSIS — N186 End stage renal disease: Secondary | ICD-10-CM | POA: Diagnosis not present

## 2023-02-24 DIAGNOSIS — D631 Anemia in chronic kidney disease: Secondary | ICD-10-CM | POA: Diagnosis not present

## 2023-02-24 DIAGNOSIS — N2581 Secondary hyperparathyroidism of renal origin: Secondary | ICD-10-CM | POA: Diagnosis not present

## 2023-02-24 DIAGNOSIS — T7840XA Allergy, unspecified, initial encounter: Secondary | ICD-10-CM | POA: Diagnosis not present

## 2023-02-24 DIAGNOSIS — D509 Iron deficiency anemia, unspecified: Secondary | ICD-10-CM | POA: Diagnosis not present

## 2023-02-24 DIAGNOSIS — Z992 Dependence on renal dialysis: Secondary | ICD-10-CM | POA: Diagnosis not present

## 2023-02-25 ENCOUNTER — Encounter: Payer: Self-pay | Admitting: Internal Medicine

## 2023-02-25 ENCOUNTER — Ambulatory Visit (INDEPENDENT_AMBULATORY_CARE_PROVIDER_SITE_OTHER): Payer: 59 | Admitting: Internal Medicine

## 2023-02-25 VITALS — BP 128/88 | HR 75 | Ht 67.0 in | Wt 253.4 lb

## 2023-02-25 DIAGNOSIS — J449 Chronic obstructive pulmonary disease, unspecified: Secondary | ICD-10-CM | POA: Diagnosis not present

## 2023-02-25 DIAGNOSIS — G4733 Obstructive sleep apnea (adult) (pediatric): Secondary | ICD-10-CM

## 2023-02-25 DIAGNOSIS — I420 Dilated cardiomyopathy: Secondary | ICD-10-CM | POA: Diagnosis not present

## 2023-02-25 NOTE — Assessment & Plan Note (Signed)
Benefits from CPAP with good compliance and control Plan-continue auto 5-20 

## 2023-02-25 NOTE — Assessment & Plan Note (Signed)
He continues follow-up with cardiology.  Denies any recent acute problems.

## 2023-02-25 NOTE — Assessment & Plan Note (Signed)
He reports no worsening of respiratory status during current early spring pollen season.  Inhalers seem adequate.

## 2023-02-25 NOTE — Patient Instructions (Addendum)
We can continue CPAP auto 5-20  Please call if we can help 

## 2023-02-26 DIAGNOSIS — Z992 Dependence on renal dialysis: Secondary | ICD-10-CM | POA: Diagnosis not present

## 2023-02-26 DIAGNOSIS — D631 Anemia in chronic kidney disease: Secondary | ICD-10-CM | POA: Diagnosis not present

## 2023-02-26 DIAGNOSIS — N2581 Secondary hyperparathyroidism of renal origin: Secondary | ICD-10-CM | POA: Diagnosis not present

## 2023-02-26 DIAGNOSIS — L299 Pruritus, unspecified: Secondary | ICD-10-CM | POA: Diagnosis not present

## 2023-02-26 DIAGNOSIS — D509 Iron deficiency anemia, unspecified: Secondary | ICD-10-CM | POA: Diagnosis not present

## 2023-02-26 DIAGNOSIS — N186 End stage renal disease: Secondary | ICD-10-CM | POA: Diagnosis not present

## 2023-02-26 DIAGNOSIS — T7840XA Allergy, unspecified, initial encounter: Secondary | ICD-10-CM | POA: Diagnosis not present

## 2023-03-01 DIAGNOSIS — N186 End stage renal disease: Secondary | ICD-10-CM | POA: Diagnosis not present

## 2023-03-01 DIAGNOSIS — L299 Pruritus, unspecified: Secondary | ICD-10-CM | POA: Diagnosis not present

## 2023-03-01 DIAGNOSIS — D631 Anemia in chronic kidney disease: Secondary | ICD-10-CM | POA: Diagnosis not present

## 2023-03-01 DIAGNOSIS — D509 Iron deficiency anemia, unspecified: Secondary | ICD-10-CM | POA: Diagnosis not present

## 2023-03-01 DIAGNOSIS — N2581 Secondary hyperparathyroidism of renal origin: Secondary | ICD-10-CM | POA: Diagnosis not present

## 2023-03-01 DIAGNOSIS — Z992 Dependence on renal dialysis: Secondary | ICD-10-CM | POA: Diagnosis not present

## 2023-03-01 DIAGNOSIS — T7840XA Allergy, unspecified, initial encounter: Secondary | ICD-10-CM | POA: Diagnosis not present

## 2023-03-03 DIAGNOSIS — T7840XA Allergy, unspecified, initial encounter: Secondary | ICD-10-CM | POA: Diagnosis not present

## 2023-03-03 DIAGNOSIS — D631 Anemia in chronic kidney disease: Secondary | ICD-10-CM | POA: Diagnosis not present

## 2023-03-03 DIAGNOSIS — L299 Pruritus, unspecified: Secondary | ICD-10-CM | POA: Diagnosis not present

## 2023-03-03 DIAGNOSIS — N2581 Secondary hyperparathyroidism of renal origin: Secondary | ICD-10-CM | POA: Diagnosis not present

## 2023-03-03 DIAGNOSIS — D509 Iron deficiency anemia, unspecified: Secondary | ICD-10-CM | POA: Diagnosis not present

## 2023-03-03 DIAGNOSIS — Z992 Dependence on renal dialysis: Secondary | ICD-10-CM | POA: Diagnosis not present

## 2023-03-03 DIAGNOSIS — N186 End stage renal disease: Secondary | ICD-10-CM | POA: Diagnosis not present

## 2023-03-05 DIAGNOSIS — N186 End stage renal disease: Secondary | ICD-10-CM | POA: Diagnosis not present

## 2023-03-05 DIAGNOSIS — Z992 Dependence on renal dialysis: Secondary | ICD-10-CM | POA: Diagnosis not present

## 2023-03-05 DIAGNOSIS — D631 Anemia in chronic kidney disease: Secondary | ICD-10-CM | POA: Diagnosis not present

## 2023-03-05 DIAGNOSIS — N2581 Secondary hyperparathyroidism of renal origin: Secondary | ICD-10-CM | POA: Diagnosis not present

## 2023-03-05 DIAGNOSIS — T7840XA Allergy, unspecified, initial encounter: Secondary | ICD-10-CM | POA: Diagnosis not present

## 2023-03-05 DIAGNOSIS — D509 Iron deficiency anemia, unspecified: Secondary | ICD-10-CM | POA: Diagnosis not present

## 2023-03-05 DIAGNOSIS — L299 Pruritus, unspecified: Secondary | ICD-10-CM | POA: Diagnosis not present

## 2023-03-06 DIAGNOSIS — N186 End stage renal disease: Secondary | ICD-10-CM | POA: Diagnosis not present

## 2023-03-06 DIAGNOSIS — I129 Hypertensive chronic kidney disease with stage 1 through stage 4 chronic kidney disease, or unspecified chronic kidney disease: Secondary | ICD-10-CM | POA: Diagnosis not present

## 2023-03-06 DIAGNOSIS — Z992 Dependence on renal dialysis: Secondary | ICD-10-CM | POA: Diagnosis not present

## 2023-03-07 ENCOUNTER — Other Ambulatory Visit: Payer: Self-pay | Admitting: Internal Medicine

## 2023-03-07 DIAGNOSIS — R4589 Other symptoms and signs involving emotional state: Secondary | ICD-10-CM

## 2023-03-08 DIAGNOSIS — N2581 Secondary hyperparathyroidism of renal origin: Secondary | ICD-10-CM | POA: Diagnosis not present

## 2023-03-08 DIAGNOSIS — Z992 Dependence on renal dialysis: Secondary | ICD-10-CM | POA: Diagnosis not present

## 2023-03-08 DIAGNOSIS — L299 Pruritus, unspecified: Secondary | ICD-10-CM | POA: Diagnosis not present

## 2023-03-08 DIAGNOSIS — N186 End stage renal disease: Secondary | ICD-10-CM | POA: Diagnosis not present

## 2023-03-08 DIAGNOSIS — D509 Iron deficiency anemia, unspecified: Secondary | ICD-10-CM | POA: Diagnosis not present

## 2023-03-08 DIAGNOSIS — T7840XA Allergy, unspecified, initial encounter: Secondary | ICD-10-CM | POA: Diagnosis not present

## 2023-03-08 DIAGNOSIS — D631 Anemia in chronic kidney disease: Secondary | ICD-10-CM | POA: Diagnosis not present

## 2023-03-08 DIAGNOSIS — R52 Pain, unspecified: Secondary | ICD-10-CM | POA: Diagnosis not present

## 2023-03-10 ENCOUNTER — Other Ambulatory Visit: Payer: Self-pay | Admitting: Pharmacist

## 2023-03-10 DIAGNOSIS — D631 Anemia in chronic kidney disease: Secondary | ICD-10-CM | POA: Diagnosis not present

## 2023-03-10 DIAGNOSIS — D509 Iron deficiency anemia, unspecified: Secondary | ICD-10-CM | POA: Diagnosis not present

## 2023-03-10 DIAGNOSIS — N2581 Secondary hyperparathyroidism of renal origin: Secondary | ICD-10-CM | POA: Diagnosis not present

## 2023-03-10 DIAGNOSIS — N186 End stage renal disease: Secondary | ICD-10-CM | POA: Diagnosis not present

## 2023-03-10 DIAGNOSIS — Z992 Dependence on renal dialysis: Secondary | ICD-10-CM | POA: Diagnosis not present

## 2023-03-10 DIAGNOSIS — L299 Pruritus, unspecified: Secondary | ICD-10-CM | POA: Diagnosis not present

## 2023-03-10 DIAGNOSIS — R52 Pain, unspecified: Secondary | ICD-10-CM | POA: Diagnosis not present

## 2023-03-10 DIAGNOSIS — T7840XA Allergy, unspecified, initial encounter: Secondary | ICD-10-CM | POA: Diagnosis not present

## 2023-03-10 MED ORDER — LINACLOTIDE 72 MCG PO CAPS: ORAL_CAPSULE | ORAL | 1 refills | Status: DC

## 2023-03-12 DIAGNOSIS — Z992 Dependence on renal dialysis: Secondary | ICD-10-CM | POA: Diagnosis not present

## 2023-03-12 DIAGNOSIS — N2581 Secondary hyperparathyroidism of renal origin: Secondary | ICD-10-CM | POA: Diagnosis not present

## 2023-03-12 DIAGNOSIS — R52 Pain, unspecified: Secondary | ICD-10-CM | POA: Diagnosis not present

## 2023-03-12 DIAGNOSIS — L299 Pruritus, unspecified: Secondary | ICD-10-CM | POA: Diagnosis not present

## 2023-03-12 DIAGNOSIS — N186 End stage renal disease: Secondary | ICD-10-CM | POA: Diagnosis not present

## 2023-03-12 DIAGNOSIS — D509 Iron deficiency anemia, unspecified: Secondary | ICD-10-CM | POA: Diagnosis not present

## 2023-03-12 DIAGNOSIS — T7840XA Allergy, unspecified, initial encounter: Secondary | ICD-10-CM | POA: Diagnosis not present

## 2023-03-12 DIAGNOSIS — D631 Anemia in chronic kidney disease: Secondary | ICD-10-CM | POA: Diagnosis not present

## 2023-03-14 ENCOUNTER — Telehealth: Payer: Self-pay | Admitting: Gastroenterology

## 2023-03-14 NOTE — Telephone Encounter (Signed)
Patient called and stated he can not make his Colonoscopy on 03/17/2023 at [facility] due to another appointment. Would like a call back to reschedule. Please advise.

## 2023-03-14 NOTE — Telephone Encounter (Signed)
4/11 colonoscopy cancelled at [facility]. Pt will call back to reschedule.

## 2023-03-15 DIAGNOSIS — D509 Iron deficiency anemia, unspecified: Secondary | ICD-10-CM | POA: Diagnosis not present

## 2023-03-15 DIAGNOSIS — D631 Anemia in chronic kidney disease: Secondary | ICD-10-CM | POA: Diagnosis not present

## 2023-03-15 DIAGNOSIS — N2581 Secondary hyperparathyroidism of renal origin: Secondary | ICD-10-CM | POA: Diagnosis not present

## 2023-03-15 DIAGNOSIS — N186 End stage renal disease: Secondary | ICD-10-CM | POA: Diagnosis not present

## 2023-03-15 DIAGNOSIS — Z992 Dependence on renal dialysis: Secondary | ICD-10-CM | POA: Diagnosis not present

## 2023-03-15 DIAGNOSIS — R52 Pain, unspecified: Secondary | ICD-10-CM | POA: Diagnosis not present

## 2023-03-15 DIAGNOSIS — L299 Pruritus, unspecified: Secondary | ICD-10-CM | POA: Diagnosis not present

## 2023-03-15 DIAGNOSIS — T7840XA Allergy, unspecified, initial encounter: Secondary | ICD-10-CM | POA: Diagnosis not present

## 2023-03-17 ENCOUNTER — Encounter (HOSPITAL_COMMUNITY): Admission: RE | Payer: Self-pay | Source: Home / Self Care

## 2023-03-17 ENCOUNTER — Ambulatory Visit (HOSPITAL_COMMUNITY): Admission: RE | Admit: 2023-03-17 | Payer: 59 | Source: Home / Self Care | Admitting: Gastroenterology

## 2023-03-17 DIAGNOSIS — D631 Anemia in chronic kidney disease: Secondary | ICD-10-CM | POA: Diagnosis not present

## 2023-03-17 DIAGNOSIS — T7840XA Allergy, unspecified, initial encounter: Secondary | ICD-10-CM | POA: Diagnosis not present

## 2023-03-17 DIAGNOSIS — Z992 Dependence on renal dialysis: Secondary | ICD-10-CM | POA: Diagnosis not present

## 2023-03-17 DIAGNOSIS — R52 Pain, unspecified: Secondary | ICD-10-CM | POA: Diagnosis not present

## 2023-03-17 DIAGNOSIS — N186 End stage renal disease: Secondary | ICD-10-CM | POA: Diagnosis not present

## 2023-03-17 DIAGNOSIS — L299 Pruritus, unspecified: Secondary | ICD-10-CM | POA: Diagnosis not present

## 2023-03-17 DIAGNOSIS — D509 Iron deficiency anemia, unspecified: Secondary | ICD-10-CM | POA: Diagnosis not present

## 2023-03-17 DIAGNOSIS — N2581 Secondary hyperparathyroidism of renal origin: Secondary | ICD-10-CM | POA: Diagnosis not present

## 2023-03-17 SURGERY — COLONOSCOPY WITH PROPOFOL
Anesthesia: Monitor Anesthesia Care

## 2023-03-19 DIAGNOSIS — Z992 Dependence on renal dialysis: Secondary | ICD-10-CM | POA: Diagnosis not present

## 2023-03-19 DIAGNOSIS — D631 Anemia in chronic kidney disease: Secondary | ICD-10-CM | POA: Diagnosis not present

## 2023-03-19 DIAGNOSIS — L299 Pruritus, unspecified: Secondary | ICD-10-CM | POA: Diagnosis not present

## 2023-03-19 DIAGNOSIS — T7840XA Allergy, unspecified, initial encounter: Secondary | ICD-10-CM | POA: Diagnosis not present

## 2023-03-19 DIAGNOSIS — N2581 Secondary hyperparathyroidism of renal origin: Secondary | ICD-10-CM | POA: Diagnosis not present

## 2023-03-19 DIAGNOSIS — N186 End stage renal disease: Secondary | ICD-10-CM | POA: Diagnosis not present

## 2023-03-19 DIAGNOSIS — R52 Pain, unspecified: Secondary | ICD-10-CM | POA: Diagnosis not present

## 2023-03-19 DIAGNOSIS — D509 Iron deficiency anemia, unspecified: Secondary | ICD-10-CM | POA: Diagnosis not present

## 2023-03-21 ENCOUNTER — Other Ambulatory Visit: Payer: Self-pay | Admitting: Internal Medicine

## 2023-03-21 ENCOUNTER — Other Ambulatory Visit: Payer: Self-pay | Admitting: Cardiology

## 2023-03-21 DIAGNOSIS — I2510 Atherosclerotic heart disease of native coronary artery without angina pectoris: Secondary | ICD-10-CM

## 2023-03-21 DIAGNOSIS — I5042 Chronic combined systolic (congestive) and diastolic (congestive) heart failure: Secondary | ICD-10-CM

## 2023-03-22 DIAGNOSIS — Z992 Dependence on renal dialysis: Secondary | ICD-10-CM | POA: Diagnosis not present

## 2023-03-22 DIAGNOSIS — N186 End stage renal disease: Secondary | ICD-10-CM | POA: Diagnosis not present

## 2023-03-22 DIAGNOSIS — T7840XA Allergy, unspecified, initial encounter: Secondary | ICD-10-CM | POA: Diagnosis not present

## 2023-03-22 DIAGNOSIS — L299 Pruritus, unspecified: Secondary | ICD-10-CM | POA: Diagnosis not present

## 2023-03-22 DIAGNOSIS — R52 Pain, unspecified: Secondary | ICD-10-CM | POA: Diagnosis not present

## 2023-03-22 DIAGNOSIS — N2581 Secondary hyperparathyroidism of renal origin: Secondary | ICD-10-CM | POA: Diagnosis not present

## 2023-03-22 DIAGNOSIS — D631 Anemia in chronic kidney disease: Secondary | ICD-10-CM | POA: Diagnosis not present

## 2023-03-22 DIAGNOSIS — D509 Iron deficiency anemia, unspecified: Secondary | ICD-10-CM | POA: Diagnosis not present

## 2023-03-23 ENCOUNTER — Other Ambulatory Visit: Payer: Self-pay | Admitting: Nephrology

## 2023-03-23 DIAGNOSIS — G4733 Obstructive sleep apnea (adult) (pediatric): Secondary | ICD-10-CM | POA: Diagnosis not present

## 2023-03-23 DIAGNOSIS — J449 Chronic obstructive pulmonary disease, unspecified: Secondary | ICD-10-CM | POA: Diagnosis not present

## 2023-03-23 DIAGNOSIS — R109 Unspecified abdominal pain: Secondary | ICD-10-CM

## 2023-03-24 DIAGNOSIS — N186 End stage renal disease: Secondary | ICD-10-CM | POA: Diagnosis not present

## 2023-03-24 DIAGNOSIS — D509 Iron deficiency anemia, unspecified: Secondary | ICD-10-CM | POA: Diagnosis not present

## 2023-03-24 DIAGNOSIS — N2581 Secondary hyperparathyroidism of renal origin: Secondary | ICD-10-CM | POA: Diagnosis not present

## 2023-03-24 DIAGNOSIS — L299 Pruritus, unspecified: Secondary | ICD-10-CM | POA: Diagnosis not present

## 2023-03-24 DIAGNOSIS — R52 Pain, unspecified: Secondary | ICD-10-CM | POA: Diagnosis not present

## 2023-03-24 DIAGNOSIS — D631 Anemia in chronic kidney disease: Secondary | ICD-10-CM | POA: Diagnosis not present

## 2023-03-24 DIAGNOSIS — Z992 Dependence on renal dialysis: Secondary | ICD-10-CM | POA: Diagnosis not present

## 2023-03-24 DIAGNOSIS — T7840XA Allergy, unspecified, initial encounter: Secondary | ICD-10-CM | POA: Diagnosis not present

## 2023-03-25 ENCOUNTER — Ambulatory Visit: Payer: Self-pay

## 2023-03-25 NOTE — Patient Outreach (Signed)
  Care Coordination   03/25/2023 Name: Victor Thompson MRN: [id number] DOB: 16-Nov-1961   Care Coordination Outreach Attempts:  An unsuccessful telephone outreach was attempted for a scheduled appointment today.  Follow Up Plan:  Additional outreach attempts will be made to offer the patient care coordination information and services.   Encounter Outcome:  No Answer   Care Coordination Interventions:  No, not indicated    Delsa Sale, RN, BSN, CCM Care Management Coordinator [facility] Care Management  Direct Phone: [phone]

## 2023-03-26 DIAGNOSIS — D509 Iron deficiency anemia, unspecified: Secondary | ICD-10-CM | POA: Diagnosis not present

## 2023-03-26 DIAGNOSIS — T7840XA Allergy, unspecified, initial encounter: Secondary | ICD-10-CM | POA: Diagnosis not present

## 2023-03-26 DIAGNOSIS — L299 Pruritus, unspecified: Secondary | ICD-10-CM | POA: Diagnosis not present

## 2023-03-26 DIAGNOSIS — N2581 Secondary hyperparathyroidism of renal origin: Secondary | ICD-10-CM | POA: Diagnosis not present

## 2023-03-26 DIAGNOSIS — D631 Anemia in chronic kidney disease: Secondary | ICD-10-CM | POA: Diagnosis not present

## 2023-03-26 DIAGNOSIS — N186 End stage renal disease: Secondary | ICD-10-CM | POA: Diagnosis not present

## 2023-03-26 DIAGNOSIS — R52 Pain, unspecified: Secondary | ICD-10-CM | POA: Diagnosis not present

## 2023-03-26 DIAGNOSIS — Z992 Dependence on renal dialysis: Secondary | ICD-10-CM | POA: Diagnosis not present

## 2023-03-29 DIAGNOSIS — N186 End stage renal disease: Secondary | ICD-10-CM | POA: Diagnosis not present

## 2023-03-29 DIAGNOSIS — D631 Anemia in chronic kidney disease: Secondary | ICD-10-CM | POA: Diagnosis not present

## 2023-03-29 DIAGNOSIS — N2581 Secondary hyperparathyroidism of renal origin: Secondary | ICD-10-CM | POA: Diagnosis not present

## 2023-03-29 DIAGNOSIS — Z992 Dependence on renal dialysis: Secondary | ICD-10-CM | POA: Diagnosis not present

## 2023-03-29 DIAGNOSIS — D509 Iron deficiency anemia, unspecified: Secondary | ICD-10-CM | POA: Diagnosis not present

## 2023-03-29 DIAGNOSIS — L299 Pruritus, unspecified: Secondary | ICD-10-CM | POA: Diagnosis not present

## 2023-03-29 DIAGNOSIS — T7840XA Allergy, unspecified, initial encounter: Secondary | ICD-10-CM | POA: Diagnosis not present

## 2023-03-29 DIAGNOSIS — R52 Pain, unspecified: Secondary | ICD-10-CM | POA: Diagnosis not present

## 2023-03-30 ENCOUNTER — Other Ambulatory Visit: Payer: 59

## 2023-03-30 ENCOUNTER — Ambulatory Visit
Admission: RE | Admit: 2023-03-30 | Discharge: 2023-03-30 | Disposition: A | Discharge status: Home / Self Care | Payer: 59 | Source: Ambulatory Visit | Attending: Nephrology | Admitting: Nephrology

## 2023-03-30 DIAGNOSIS — H2513 Age-related nuclear cataract, bilateral: Secondary | ICD-10-CM | POA: Diagnosis not present

## 2023-03-30 DIAGNOSIS — R109 Unspecified abdominal pain: Secondary | ICD-10-CM

## 2023-03-30 DIAGNOSIS — N271 Small kidney, bilateral: Secondary | ICD-10-CM | POA: Diagnosis not present

## 2023-03-31 ENCOUNTER — Other Ambulatory Visit: Payer: Self-pay

## 2023-03-31 DIAGNOSIS — N186 End stage renal disease: Secondary | ICD-10-CM | POA: Diagnosis not present

## 2023-03-31 DIAGNOSIS — D631 Anemia in chronic kidney disease: Secondary | ICD-10-CM | POA: Diagnosis not present

## 2023-03-31 DIAGNOSIS — Z992 Dependence on renal dialysis: Secondary | ICD-10-CM | POA: Diagnosis not present

## 2023-03-31 DIAGNOSIS — T7840XA Allergy, unspecified, initial encounter: Secondary | ICD-10-CM | POA: Diagnosis not present

## 2023-03-31 DIAGNOSIS — R52 Pain, unspecified: Secondary | ICD-10-CM | POA: Diagnosis not present

## 2023-03-31 DIAGNOSIS — N2581 Secondary hyperparathyroidism of renal origin: Secondary | ICD-10-CM | POA: Diagnosis not present

## 2023-03-31 DIAGNOSIS — D509 Iron deficiency anemia, unspecified: Secondary | ICD-10-CM | POA: Diagnosis not present

## 2023-03-31 DIAGNOSIS — L299 Pruritus, unspecified: Secondary | ICD-10-CM | POA: Diagnosis not present

## 2023-04-01 ENCOUNTER — Other Ambulatory Visit: Payer: Self-pay

## 2023-04-02 DIAGNOSIS — T7840XA Allergy, unspecified, initial encounter: Secondary | ICD-10-CM | POA: Diagnosis not present

## 2023-04-02 DIAGNOSIS — R52 Pain, unspecified: Secondary | ICD-10-CM | POA: Diagnosis not present

## 2023-04-02 DIAGNOSIS — L299 Pruritus, unspecified: Secondary | ICD-10-CM | POA: Diagnosis not present

## 2023-04-02 DIAGNOSIS — Z992 Dependence on renal dialysis: Secondary | ICD-10-CM | POA: Diagnosis not present

## 2023-04-02 DIAGNOSIS — D509 Iron deficiency anemia, unspecified: Secondary | ICD-10-CM | POA: Diagnosis not present

## 2023-04-02 DIAGNOSIS — D631 Anemia in chronic kidney disease: Secondary | ICD-10-CM | POA: Diagnosis not present

## 2023-04-02 DIAGNOSIS — N186 End stage renal disease: Secondary | ICD-10-CM | POA: Diagnosis not present

## 2023-04-02 DIAGNOSIS — N2581 Secondary hyperparathyroidism of renal origin: Secondary | ICD-10-CM | POA: Diagnosis not present

## 2023-04-05 DIAGNOSIS — Z992 Dependence on renal dialysis: Secondary | ICD-10-CM | POA: Diagnosis not present

## 2023-04-05 DIAGNOSIS — N186 End stage renal disease: Secondary | ICD-10-CM | POA: Diagnosis not present

## 2023-04-05 DIAGNOSIS — N2581 Secondary hyperparathyroidism of renal origin: Secondary | ICD-10-CM | POA: Diagnosis not present

## 2023-04-05 DIAGNOSIS — T7840XA Allergy, unspecified, initial encounter: Secondary | ICD-10-CM | POA: Diagnosis not present

## 2023-04-05 DIAGNOSIS — I129 Hypertensive chronic kidney disease with stage 1 through stage 4 chronic kidney disease, or unspecified chronic kidney disease: Secondary | ICD-10-CM | POA: Diagnosis not present

## 2023-04-05 DIAGNOSIS — D509 Iron deficiency anemia, unspecified: Secondary | ICD-10-CM | POA: Diagnosis not present

## 2023-04-05 DIAGNOSIS — D631 Anemia in chronic kidney disease: Secondary | ICD-10-CM | POA: Diagnosis not present

## 2023-04-05 DIAGNOSIS — R52 Pain, unspecified: Secondary | ICD-10-CM | POA: Diagnosis not present

## 2023-04-05 DIAGNOSIS — L299 Pruritus, unspecified: Secondary | ICD-10-CM | POA: Diagnosis not present

## 2023-04-07 DIAGNOSIS — L299 Pruritus, unspecified: Secondary | ICD-10-CM | POA: Diagnosis not present

## 2023-04-07 DIAGNOSIS — T782XXA Anaphylactic shock, unspecified, initial encounter: Secondary | ICD-10-CM | POA: Diagnosis not present

## 2023-04-07 DIAGNOSIS — N2581 Secondary hyperparathyroidism of renal origin: Secondary | ICD-10-CM | POA: Diagnosis not present

## 2023-04-07 DIAGNOSIS — T7840XA Allergy, unspecified, initial encounter: Secondary | ICD-10-CM | POA: Diagnosis not present

## 2023-04-07 DIAGNOSIS — D631 Anemia in chronic kidney disease: Secondary | ICD-10-CM | POA: Diagnosis not present

## 2023-04-07 DIAGNOSIS — D509 Iron deficiency anemia, unspecified: Secondary | ICD-10-CM | POA: Diagnosis not present

## 2023-04-07 DIAGNOSIS — Z992 Dependence on renal dialysis: Secondary | ICD-10-CM | POA: Diagnosis not present

## 2023-04-07 DIAGNOSIS — N186 End stage renal disease: Secondary | ICD-10-CM | POA: Diagnosis not present

## 2023-04-09 DIAGNOSIS — T7840XA Allergy, unspecified, initial encounter: Secondary | ICD-10-CM | POA: Diagnosis not present

## 2023-04-09 DIAGNOSIS — D631 Anemia in chronic kidney disease: Secondary | ICD-10-CM | POA: Diagnosis not present

## 2023-04-09 DIAGNOSIS — D509 Iron deficiency anemia, unspecified: Secondary | ICD-10-CM | POA: Diagnosis not present

## 2023-04-09 DIAGNOSIS — N2581 Secondary hyperparathyroidism of renal origin: Secondary | ICD-10-CM | POA: Diagnosis not present

## 2023-04-09 DIAGNOSIS — Z992 Dependence on renal dialysis: Secondary | ICD-10-CM | POA: Diagnosis not present

## 2023-04-09 DIAGNOSIS — T782XXA Anaphylactic shock, unspecified, initial encounter: Secondary | ICD-10-CM | POA: Diagnosis not present

## 2023-04-09 DIAGNOSIS — L299 Pruritus, unspecified: Secondary | ICD-10-CM | POA: Diagnosis not present

## 2023-04-09 DIAGNOSIS — N186 End stage renal disease: Secondary | ICD-10-CM | POA: Diagnosis not present

## 2023-04-12 DIAGNOSIS — N2581 Secondary hyperparathyroidism of renal origin: Secondary | ICD-10-CM | POA: Diagnosis not present

## 2023-04-12 DIAGNOSIS — L299 Pruritus, unspecified: Secondary | ICD-10-CM | POA: Diagnosis not present

## 2023-04-12 DIAGNOSIS — N186 End stage renal disease: Secondary | ICD-10-CM | POA: Diagnosis not present

## 2023-04-12 DIAGNOSIS — Z992 Dependence on renal dialysis: Secondary | ICD-10-CM | POA: Diagnosis not present

## 2023-04-12 DIAGNOSIS — D631 Anemia in chronic kidney disease: Secondary | ICD-10-CM | POA: Diagnosis not present

## 2023-04-12 DIAGNOSIS — T782XXA Anaphylactic shock, unspecified, initial encounter: Secondary | ICD-10-CM | POA: Diagnosis not present

## 2023-04-12 DIAGNOSIS — T7840XA Allergy, unspecified, initial encounter: Secondary | ICD-10-CM | POA: Diagnosis not present

## 2023-04-12 DIAGNOSIS — D509 Iron deficiency anemia, unspecified: Secondary | ICD-10-CM | POA: Diagnosis not present

## 2023-04-14 ENCOUNTER — Other Ambulatory Visit: Payer: Self-pay | Admitting: Internal Medicine

## 2023-04-14 ENCOUNTER — Encounter: Payer: Self-pay | Admitting: Dietician

## 2023-04-14 ENCOUNTER — Encounter: Payer: 59 | Attending: Internal Medicine | Admitting: Dietician

## 2023-04-14 DIAGNOSIS — D509 Iron deficiency anemia, unspecified: Secondary | ICD-10-CM | POA: Diagnosis not present

## 2023-04-14 DIAGNOSIS — T782XXA Anaphylactic shock, unspecified, initial encounter: Secondary | ICD-10-CM | POA: Diagnosis not present

## 2023-04-14 DIAGNOSIS — N186 End stage renal disease: Secondary | ICD-10-CM | POA: Diagnosis not present

## 2023-04-14 DIAGNOSIS — T7840XA Allergy, unspecified, initial encounter: Secondary | ICD-10-CM | POA: Diagnosis not present

## 2023-04-14 DIAGNOSIS — Z713 Dietary counseling and surveillance: Secondary | ICD-10-CM | POA: Insufficient documentation

## 2023-04-14 DIAGNOSIS — R7303 Prediabetes: Secondary | ICD-10-CM | POA: Diagnosis not present

## 2023-04-14 DIAGNOSIS — L299 Pruritus, unspecified: Secondary | ICD-10-CM | POA: Diagnosis not present

## 2023-04-14 DIAGNOSIS — Z6839 Body mass index (BMI) 39.0-39.9, adult: Secondary | ICD-10-CM | POA: Insufficient documentation

## 2023-04-14 DIAGNOSIS — N2581 Secondary hyperparathyroidism of renal origin: Secondary | ICD-10-CM | POA: Diagnosis not present

## 2023-04-14 DIAGNOSIS — D631 Anemia in chronic kidney disease: Secondary | ICD-10-CM | POA: Diagnosis not present

## 2023-04-14 DIAGNOSIS — Z992 Dependence on renal dialysis: Secondary | ICD-10-CM | POA: Diagnosis not present

## 2023-04-14 NOTE — Progress Notes (Signed)
Medical Nutrition Therapy  Appointment Start time:  [phone]  Appointment End time:  1500  Primary concerns today: Pt states he wants to work on weight loss.    Referral diagnosis: E66.01 & prediabetes Preferred learning style: no preference indicated Learning readiness: ready   NUTRITION ASSESSMENT   Anthropometrics  Weight not assessed (pt request)  Clinical Medical Hx: allergies, asthma, CHF, sleep apnea, HLD, HTN, kidney disease, MI, prediabetes Medications: reviewed Labs: 02/21/23 A1c 6.1%, 08/16/22 chol 203, HDL 27, LDL 164, trig 195. 10/26/22 GFR 5 Notable Signs/Symptoms: fatigue Food Allergies: none  Lifestyle & Dietary Hx  Pt states he has dialysis on T/Th/Sat. Pt reports on dialysis days he feels fatigued and he does not work. Pt works Sun/M/W/F from 10am-6pm.   Pt states his girlfriend cooks sometimes but he states she makes a lot of high salt southern foods. Pt states he likes to cook but only cooks 2-3 times per month and states he likes all the foods he shouldn't eat.  Pt states he likes driving around or going to the park to relieve stress. Pt reports his girlfriend tries to get him to go walking but he feels like he can not keep up with her pace.   Pt states he sometimes forgets his phosphorus binder with meals.   Pt states most days he usually just eats 2 meals. Either grits and eggs or an egg sandwich, and then waits until dinner and eats a [employer] sub.   Pt reports he is unsure of what his fluid restriction is or if he has one. He states he drinks 1 water bottle daily and 2 mini cans sprite.   Estimated daily fluid intake: 16 oz plain water Supplements: not assessed Sleep: 4 hours, sleep apnea, pt states sometimes he wakes up in the middle of the night and thinks about work Stress / self-care: moderate stress Current average weekly physical activity: ADLs  24-Hr Dietary Recall First Meal: 10am: grits and eggs OR egg sandwich Snack: none Second Meal:  none Snack: none Third Meal: 6pm: [place] sandwich from [employer]: none Beverages: 16 oz water, 2 mini cans sprite.    NUTRITION DIAGNOSIS  [place] Horse-2.2 Altered nutrition-related laboratory As related to prediabetes.  As evidenced by A1c 6.1%.   NUTRITION INTERVENTION  Nutrition education (E-1) on the following topics:  Discussed protein Intake with patient including options to help him meet his needs within his goals as he still need to consume enough protein to prevent malnutrition and maintain muscle mass. Explained phosphorus management: Phosphorus levels can rise in the blood due to kidney disease, leading to bone and heart problems. Dialysis does not effectively remove all phosphorus from the blood, so it's essential to limit phosphorus intake by avoiding high-phosphorus foods. Provided pt with low phosphorus fruit and vegetable list and handout on nutrient label reading for phosphorus.  Explained potassium management: Individuals with kidney disease are at risk of high potassium levels in the blood, which can affect heart function. Dialysis can help remove excess potassium, but it's still important to limit high-potassium foods. Provided pt with low potassium fruit and vegetable list.  Fluid Restriction: Dialysis removes excess fluid from the body, but individuals may still need to restrict their fluid intake between dialysis sessions to avoid fluid overload, which can lead to swelling, high blood pressure, and heart problems. Sodium Restriction: Limiting sodium intake is important to control blood pressure and reduce fluid retention. Avoiding processed foods, canned soups, and salty snacks can help keep sodium  levels in check. Discussed nutrient label reading for sodium.  Prediabetes: Prediabetes is a condition where blood sugar levels are higher than normal but not yet high enough to be diagnosed as type 2 diabetes. A1C, or hemoglobin A1c, is a blood test that provides an average of a  person's blood sugar levels over the past two to three months. It is commonly used to diagnose and monitor diabetes. Discussed prediabetes, A1c, and insulin resistance. For prediabetes, an A1C level between 5.7% and 6.4% typically is used to diagnose this. Here is how the A1C levels are generally categorized: Normal:  A1C below 5.7% Prediabetes:  A1C between 5.7% and 6.4% Diabetes:  A1C of 6.5% or higher When diagnosed with prediabetes, there are several lifestyle changes you can make to manage the condition: Healthy Eating:  Follow a well-balanced diet that includes a variety of fruits, vegetables, grains, lean proteins, and healthy fats. Monitor portion sizes and reduce intake of sugary and processed foods. Regular Physical Activity:  Engage in regular physical activity, such as brisk walking, cycling, or other aerobic exercises, for at least 150 minutes per week. Include strength training exercises at least twice a week. Weight Management: Achieve and maintain a healthy weight. Losing even a small amount of weight (3-5%) can significantly improve insulin sensitivity.  Handouts Provided Include  Kidney.org: low potassium fruits and vegetables Kidney.org: low phosphorus fruits and vegetables Nutrition Care Manual: Chronic Kidney Disease Stage 5: Tips for People on Dialysis Dish Up a Kidney Friendly Meal  Learning Style & Readiness for Change Teaching method utilized: Visual & Auditory  Demonstrated degree of understanding via: Teach Back  Barriers to learning/adherence to lifestyle change: none  Goals Established by Pt Goal: Aim to start incorporating more low potassium and low phosphorus fruits and vegetables into your diet. Goal: Walk for 15 minutes 3 days per week.  Goal: reduce sprite intake to 1 mini can daily.    MONITORING & EVALUATION Dietary intake, weekly physical activity, and follow up in 2 months.  Next Steps  Patient is to call for questions.

## 2023-04-14 NOTE — Patient Instructions (Signed)
Goals Established by Pt Goal: Aim to start incorporating more low potassium and low phosphorus fruits and vegetables into your diet. Goal: Walk for 15 minutes 3 days per week.  Goal: reduce sprite intake to 1 mini can daily.

## 2023-04-14 NOTE — Telephone Encounter (Signed)
Pt requests change of pharmacy  Requested Prescriptions  Pending Prescriptions Disp Refills   albuterol (VENTOLIN HFA) 108 (90 Base) MCG/ACT inhaler [Pharmacy Med Name: albuterol sulfate HFA 90 mcg/actuation aerosol inhaler] 60.3 g 2    Sig: INHALE TWO PUFFS BY MOUTH EVERY 4 HOURS AS NEEDED     Pulmonology:  Beta Agonists 2 Passed - 04/14/2023  8:02 AM      Passed - Last BP in normal range    BP Readings from Last 1 Encounters:  02/25/23 128/88         Passed - Last Heart Rate in normal range    Pulse Readings from Last 1 Encounters:  02/25/23 75         Passed - Valid encounter within last 12 months    Recent Outpatient Visits           1 month ago Chronic combined systolic and diastolic CHF (congestive heart failure) (HCC)   [place] [facility] Jonah Blue B, MD   5 months ago Chronic systolic congestive heart failure, NYHA class 2 [facility])   [place] [facility] [name], MD   7 months ago Hospital discharge follow-up   [facility] [name], MD   7 months ago Anxiety about health   [facility] at [facility], [place], [place]   10 months ago Essential hypertension   [place] [facility] [name], MD       Future Appointments             In 2 months Laural Benes, Binnie Rail, MD [facility]

## 2023-04-16 DIAGNOSIS — T7840XA Allergy, unspecified, initial encounter: Secondary | ICD-10-CM | POA: Diagnosis not present

## 2023-04-16 DIAGNOSIS — Z992 Dependence on renal dialysis: Secondary | ICD-10-CM | POA: Diagnosis not present

## 2023-04-16 DIAGNOSIS — D509 Iron deficiency anemia, unspecified: Secondary | ICD-10-CM | POA: Diagnosis not present

## 2023-04-16 DIAGNOSIS — D631 Anemia in chronic kidney disease: Secondary | ICD-10-CM | POA: Diagnosis not present

## 2023-04-16 DIAGNOSIS — T782XXA Anaphylactic shock, unspecified, initial encounter: Secondary | ICD-10-CM | POA: Diagnosis not present

## 2023-04-16 DIAGNOSIS — N2581 Secondary hyperparathyroidism of renal origin: Secondary | ICD-10-CM | POA: Diagnosis not present

## 2023-04-16 DIAGNOSIS — N186 End stage renal disease: Secondary | ICD-10-CM | POA: Diagnosis not present

## 2023-04-16 DIAGNOSIS — L299 Pruritus, unspecified: Secondary | ICD-10-CM | POA: Diagnosis not present

## 2023-04-19 DIAGNOSIS — L299 Pruritus, unspecified: Secondary | ICD-10-CM | POA: Diagnosis not present

## 2023-04-19 DIAGNOSIS — D509 Iron deficiency anemia, unspecified: Secondary | ICD-10-CM | POA: Diagnosis not present

## 2023-04-19 DIAGNOSIS — T782XXA Anaphylactic shock, unspecified, initial encounter: Secondary | ICD-10-CM | POA: Diagnosis not present

## 2023-04-19 DIAGNOSIS — N186 End stage renal disease: Secondary | ICD-10-CM | POA: Diagnosis not present

## 2023-04-19 DIAGNOSIS — N2581 Secondary hyperparathyroidism of renal origin: Secondary | ICD-10-CM | POA: Diagnosis not present

## 2023-04-19 DIAGNOSIS — T7840XA Allergy, unspecified, initial encounter: Secondary | ICD-10-CM | POA: Diagnosis not present

## 2023-04-19 DIAGNOSIS — D631 Anemia in chronic kidney disease: Secondary | ICD-10-CM | POA: Diagnosis not present

## 2023-04-19 DIAGNOSIS — Z992 Dependence on renal dialysis: Secondary | ICD-10-CM | POA: Diagnosis not present

## 2023-04-20 ENCOUNTER — Telehealth: Payer: Self-pay | Admitting: *Deleted

## 2023-04-21 DIAGNOSIS — Z992 Dependence on renal dialysis: Secondary | ICD-10-CM | POA: Diagnosis not present

## 2023-04-21 DIAGNOSIS — L299 Pruritus, unspecified: Secondary | ICD-10-CM | POA: Diagnosis not present

## 2023-04-21 DIAGNOSIS — T782XXA Anaphylactic shock, unspecified, initial encounter: Secondary | ICD-10-CM | POA: Diagnosis not present

## 2023-04-21 DIAGNOSIS — T7840XA Allergy, unspecified, initial encounter: Secondary | ICD-10-CM | POA: Diagnosis not present

## 2023-04-21 DIAGNOSIS — N2581 Secondary hyperparathyroidism of renal origin: Secondary | ICD-10-CM | POA: Diagnosis not present

## 2023-04-21 DIAGNOSIS — D631 Anemia in chronic kidney disease: Secondary | ICD-10-CM | POA: Diagnosis not present

## 2023-04-21 DIAGNOSIS — N186 End stage renal disease: Secondary | ICD-10-CM | POA: Diagnosis not present

## 2023-04-21 DIAGNOSIS — D509 Iron deficiency anemia, unspecified: Secondary | ICD-10-CM | POA: Diagnosis not present

## 2023-04-22 DIAGNOSIS — G4733 Obstructive sleep apnea (adult) (pediatric): Secondary | ICD-10-CM | POA: Diagnosis not present

## 2023-04-23 DIAGNOSIS — D509 Iron deficiency anemia, unspecified: Secondary | ICD-10-CM | POA: Diagnosis not present

## 2023-04-23 DIAGNOSIS — N186 End stage renal disease: Secondary | ICD-10-CM | POA: Diagnosis not present

## 2023-04-23 DIAGNOSIS — Z992 Dependence on renal dialysis: Secondary | ICD-10-CM | POA: Diagnosis not present

## 2023-04-23 DIAGNOSIS — D631 Anemia in chronic kidney disease: Secondary | ICD-10-CM | POA: Diagnosis not present

## 2023-04-23 DIAGNOSIS — L299 Pruritus, unspecified: Secondary | ICD-10-CM | POA: Diagnosis not present

## 2023-04-23 DIAGNOSIS — T782XXA Anaphylactic shock, unspecified, initial encounter: Secondary | ICD-10-CM | POA: Diagnosis not present

## 2023-04-23 DIAGNOSIS — N2581 Secondary hyperparathyroidism of renal origin: Secondary | ICD-10-CM | POA: Diagnosis not present

## 2023-04-23 DIAGNOSIS — T7840XA Allergy, unspecified, initial encounter: Secondary | ICD-10-CM | POA: Diagnosis not present

## 2023-04-25 ENCOUNTER — Other Ambulatory Visit: Payer: Self-pay | Admitting: Internal Medicine

## 2023-04-25 DIAGNOSIS — K5903 Drug induced constipation: Secondary | ICD-10-CM

## 2023-04-25 NOTE — Telephone Encounter (Signed)
Medication Refill - Medication: fluticansone 50 mcg  /docusate 100mg   Has the patient contacted their pharmacy? yes (Agent: If no, request that the patient contact the pharmacy for the refill. If patient does not wish to contact the pharmacy document the reason why and proceed with request.) (Agent: If yes, when and what did the pharmacy advise?)  Preferred Pharmacy (with phone number or street name):  [facility] - [address] [place], [place] - [address] Phone: [phone]  Fax: [phone]     Has the patient been seen for an appointment in the last year OR does the patient have an upcoming appointment? yes  Agent: Please be advised that RX refills may take up to 3 business days. We ask that you follow-up with your pharmacy.

## 2023-04-26 DIAGNOSIS — N186 End stage renal disease: Secondary | ICD-10-CM | POA: Diagnosis not present

## 2023-04-26 DIAGNOSIS — T7840XA Allergy, unspecified, initial encounter: Secondary | ICD-10-CM | POA: Diagnosis not present

## 2023-04-26 DIAGNOSIS — T782XXA Anaphylactic shock, unspecified, initial encounter: Secondary | ICD-10-CM | POA: Diagnosis not present

## 2023-04-26 DIAGNOSIS — N2581 Secondary hyperparathyroidism of renal origin: Secondary | ICD-10-CM | POA: Diagnosis not present

## 2023-04-26 DIAGNOSIS — D631 Anemia in chronic kidney disease: Secondary | ICD-10-CM | POA: Diagnosis not present

## 2023-04-26 DIAGNOSIS — L299 Pruritus, unspecified: Secondary | ICD-10-CM | POA: Diagnosis not present

## 2023-04-26 DIAGNOSIS — D509 Iron deficiency anemia, unspecified: Secondary | ICD-10-CM | POA: Diagnosis not present

## 2023-04-26 DIAGNOSIS — Z992 Dependence on renal dialysis: Secondary | ICD-10-CM | POA: Diagnosis not present

## 2023-04-26 MED ORDER — FLUTICASONE PROPIONATE 50 MCG/ACT NA SUSP: NASAL | 0 refills | Status: DC

## 2023-04-26 MED ORDER — DOCUSATE SODIUM 100 MG PO CAPS
ORAL_CAPSULE | ORAL | 0 refills | Status: DC
Start: 2023-04-26 — End: 2023-07-25

## 2023-04-26 NOTE — Telephone Encounter (Signed)
Requested Prescriptions  Pending Prescriptions Disp Refills   docusate sodium (COLACE) 100 MG capsule 90 capsule 0    Sig: TAKE ONE CAPSULE BY MOUTH DAILY     Over the Counter:  OTC Passed - 04/25/2023 12:19 PM      Passed - Valid encounter within last 12 months    Recent Outpatient Visits           2 months ago Chronic combined systolic and diastolic CHF (congestive heart failure) (HCC)   [place] [facility] Jonah Blue B, MD   6 months ago Chronic systolic congestive heart failure, NYHA class 2 [facility])   [place] [facility] [name], MD   7 months ago Hospital discharge follow-up   [facility] [name], MD   8 months ago Anxiety about health   [facility], [place], [place]   10 months ago Essential hypertension   [place] [facility] [name], MD       Future Appointments             In 2 months [name], MD [facility]             fluticasone [facility]) 50 MCG/ACT nasal spray 47.4 mL 0     Ear, Nose, and Throat: Nasal Preparations - Corticosteroids Passed - 04/25/2023 12:19 PM      Passed - Valid encounter within last 12 months    Recent Outpatient Visits           2 months ago Chronic combined systolic and diastolic CHF (congestive heart failure) (HCC)   [place] [facility] Jonah Blue B, MD   6 months ago Chronic systolic congestive heart failure, NYHA class 2 [facility])   [place] [facility] [name], MD   7 months ago Hospital discharge follow-up   [facility] [name], MD   8 months ago Anxiety about health   [facility], [place], [place]   10 months ago Essential hypertension   [place] [facility] [name], MD       Future Appointments             In 2 months Laural Benes, Binnie Rail, MD [facility]

## 2023-04-27 ENCOUNTER — Telehealth: Payer: Self-pay

## 2023-04-27 ENCOUNTER — Other Ambulatory Visit: Payer: Self-pay

## 2023-04-27 MED ORDER — NA SULFATE-K SULFATE-MG SULF 17.5-3.13-1.6 GM/177ML PO SOLN
1.0000 | Freq: Once | ORAL | 0 refills | Status: AC
  Filled 2023-04-27: qty 354, 1d supply, fill #0

## 2023-04-28 DIAGNOSIS — D631 Anemia in chronic kidney disease: Secondary | ICD-10-CM | POA: Diagnosis not present

## 2023-04-28 DIAGNOSIS — N2581 Secondary hyperparathyroidism of renal origin: Secondary | ICD-10-CM | POA: Diagnosis not present

## 2023-04-28 DIAGNOSIS — T7840XA Allergy, unspecified, initial encounter: Secondary | ICD-10-CM | POA: Diagnosis not present

## 2023-04-28 DIAGNOSIS — Z992 Dependence on renal dialysis: Secondary | ICD-10-CM | POA: Diagnosis not present

## 2023-04-28 DIAGNOSIS — D509 Iron deficiency anemia, unspecified: Secondary | ICD-10-CM | POA: Diagnosis not present

## 2023-04-28 DIAGNOSIS — N186 End stage renal disease: Secondary | ICD-10-CM | POA: Diagnosis not present

## 2023-04-28 DIAGNOSIS — L299 Pruritus, unspecified: Secondary | ICD-10-CM | POA: Diagnosis not present

## 2023-04-28 DIAGNOSIS — T782XXA Anaphylactic shock, unspecified, initial encounter: Secondary | ICD-10-CM | POA: Diagnosis not present

## 2023-04-30 DIAGNOSIS — T782XXA Anaphylactic shock, unspecified, initial encounter: Secondary | ICD-10-CM | POA: Diagnosis not present

## 2023-04-30 DIAGNOSIS — T7840XA Allergy, unspecified, initial encounter: Secondary | ICD-10-CM | POA: Diagnosis not present

## 2023-04-30 DIAGNOSIS — D509 Iron deficiency anemia, unspecified: Secondary | ICD-10-CM | POA: Diagnosis not present

## 2023-04-30 DIAGNOSIS — D631 Anemia in chronic kidney disease: Secondary | ICD-10-CM | POA: Diagnosis not present

## 2023-04-30 DIAGNOSIS — N186 End stage renal disease: Secondary | ICD-10-CM | POA: Diagnosis not present

## 2023-04-30 DIAGNOSIS — Z992 Dependence on renal dialysis: Secondary | ICD-10-CM | POA: Diagnosis not present

## 2023-04-30 DIAGNOSIS — N2581 Secondary hyperparathyroidism of renal origin: Secondary | ICD-10-CM | POA: Diagnosis not present

## 2023-04-30 DIAGNOSIS — L299 Pruritus, unspecified: Secondary | ICD-10-CM | POA: Diagnosis not present

## 2023-05-03 ENCOUNTER — Ambulatory Visit: Payer: Self-pay

## 2023-05-03 ENCOUNTER — Telehealth: Payer: Self-pay

## 2023-05-03 DIAGNOSIS — Z992 Dependence on renal dialysis: Secondary | ICD-10-CM | POA: Diagnosis not present

## 2023-05-03 DIAGNOSIS — T782XXA Anaphylactic shock, unspecified, initial encounter: Secondary | ICD-10-CM | POA: Diagnosis not present

## 2023-05-03 DIAGNOSIS — D509 Iron deficiency anemia, unspecified: Secondary | ICD-10-CM | POA: Diagnosis not present

## 2023-05-03 DIAGNOSIS — B351 Tinea unguium: Secondary | ICD-10-CM

## 2023-05-03 DIAGNOSIS — N186 End stage renal disease: Secondary | ICD-10-CM | POA: Diagnosis not present

## 2023-05-03 DIAGNOSIS — D631 Anemia in chronic kidney disease: Secondary | ICD-10-CM | POA: Diagnosis not present

## 2023-05-03 DIAGNOSIS — L299 Pruritus, unspecified: Secondary | ICD-10-CM | POA: Diagnosis not present

## 2023-05-03 DIAGNOSIS — N2581 Secondary hyperparathyroidism of renal origin: Secondary | ICD-10-CM | POA: Diagnosis not present

## 2023-05-03 DIAGNOSIS — T7840XA Allergy, unspecified, initial encounter: Secondary | ICD-10-CM | POA: Diagnosis not present

## 2023-05-03 NOTE — Patient Outreach (Addendum)
  Care Coordination   Follow Up Visit Note   05/03/2023 Name: Victor Thompson MRN: [id number] DOB: Feb 06, 1961  Victor Thompson is a 62 y.o. year old male who sees [name], MD for primary care. I spoke with  [name] by phone today.  What matters to the patients health and wellness today?  Patient would like to manage his prediabetes. He is requesting a podiatry referral.     Goals Addressed             This Visit's Progress    COMPLETED: Evaluate and treat hemorrhoids       Care Coordination Interventions: Evaluation of current treatment plan related to hemorrhoids and patient's adherence to plan as established by provider Discussed with patient his hemorrhoids were evaluated and treated by Dr. Laural Benes, he followed her recommendations and this issue is no longer a problem  Instructed patient to keep Dr. Laural Benes well informed of any new symptoms or concerns     To manage prediabetes       Care Coordination Interventions: Review of patient status, including review of consultants reports, relevant laboratory and other test results, and medications completed Determined patient completed a Nutritionist visit for prediabetes nutritional education Reviewed next upcoming scheduled visit is set for 06/16/23 @3 :15 PM  Mailed printed diabetes educational materials to patient for review and discussion at next RN CC follow up call  Discussed with patient his concerns related to nail fungus to both feet Placed outbound call to PCP office and requested a Podiatry referral  Lab Results  Component Value Date   HGBA1C 6.1 (H) 02/21/2023         Interventions Today    Flowsheet Row Most Recent Value  Chronic Disease   Chronic disease during today's visit Other, Diabetes  [hemorrhoids]  General Interventions   General Interventions Discussed/Reviewed General Interventions Discussed, General Interventions Reviewed, Doctor Visits, Communication with  Doctor Visits  Discussed/Reviewed Doctor Visits Discussed, Doctor Visits Reviewed, PCP, Specialist  Communication with PCP/Specialists  [Dr. Johnson]  Education Interventions   Education Provided Provided Education, Provided Printed Education  Provided Verbal Education On When to see the doctor, Nutrition, Foot Care  Nutrition Interventions   Nutrition Discussed/Reviewed Nutrition Discussed, Nutrition Reviewed, Carbohydrate meal planning          SDOH assessments and interventions completed:  No     Care Coordination Interventions:  Yes, provided   Follow up plan: Follow up call scheduled for 05/17/23 @2 :30 PM    Encounter Outcome:  Pt. Visit Completed

## 2023-05-03 NOTE — Patient Outreach (Signed)
  Care Coordination   05/03/2023 Name: Victor Thompson MRN: [id number] DOB: [birth year]   Care Coordination Outreach Attempts:  An unsuccessful telephone outreach was attempted for a scheduled appointment today.  Follow Up Plan:  Additional outreach attempts will be made to offer the patient care coordination information and services.   Encounter Outcome:  No Answer   Care Coordination Interventions:  No, not indicated    Delsa Sale, RN, BSN, CCM Care Management Coordinator [facility] Care Management Direct Phone: [phone]

## 2023-05-03 NOTE — Telephone Encounter (Signed)
Copied from CRM [id number]. Topic: Referral - Request for Referral >> May 03, 2023  4:20 PM Dominique A wrote: Has patient seen PCP for this complaint? No. *If NO, is insurance requiring patient see PCP for this issue before PCP can refer them? Referral for which specialty: Podiatry  Preferred provider/office: would like for PCP to refer Reason for referral: [facility] nurse coordinator is requesting the referral for pt. Pt is pre diabetic and has toe fungus on both feet.   Marylene Land would like a call back regarding the referral.

## 2023-05-03 NOTE — Patient Instructions (Signed)
Visit Information  Thank you for taking time to visit with me today. Please don't hesitate to contact me if I can be of assistance to you.   Following are the goals we discussed today:   Goals Addressed             This Visit's Progress    COMPLETED: Evaluate and treat hemorrhoids       Care Coordination Interventions: Evaluation of current treatment plan related to hemorrhoids and patient's adherence to plan as established by provider Discussed with patient his hemorrhoids were evaluated and treated by Dr. Laural Benes, he followed her recommendations and this issue is no longer a problem  Instructed patient to keep Dr. Laural Benes well informed of any new symptoms or concerns     To manage prediabetes       Care Coordination Interventions: Review of patient status, including review of consultants reports, relevant laboratory and other test results, and medications completed Determined patient completed a Nutritionist visit for prediabetes nutritional education Reviewed next upcoming scheduled visit is set for 06/16/23 @3 :15 PM  Mailed printed diabetes educational materials to patient for review and discussion at next RN CC follow up call  Discussed with patient his concerns related to nail fungus to both feet Placed outbound call to PCP office and requested a Podiatry referral  Lab Results  Component Value Date   HGBA1C 6.1 (H) 02/21/2023             Our next appointment is by telephone on 05/17/23 at 2:30 PM  Please call the care guide team at [phone] if you need to cancel or reschedule your appointment.   If you are experiencing a Mental Health or Behavioral Health Crisis or need someone to talk to, please call 1-800-273-TALK (toll free, 24 hour hotline) go to [facility] [address], [place] [id number])  Patient verbalizes understanding of instructions and care plan provided today and agrees to view in MyChart. Active MyChart  status and patient understanding of how to access instructions and care plan via MyChart confirmed with patient.     Delsa Sale, RN, BSN, CCM Care Management Coordinator [facility] Care Management  Direct Phone: [phone]

## 2023-05-05 DIAGNOSIS — L299 Pruritus, unspecified: Secondary | ICD-10-CM | POA: Diagnosis not present

## 2023-05-05 DIAGNOSIS — T782XXA Anaphylactic shock, unspecified, initial encounter: Secondary | ICD-10-CM | POA: Diagnosis not present

## 2023-05-05 DIAGNOSIS — N186 End stage renal disease: Secondary | ICD-10-CM | POA: Diagnosis not present

## 2023-05-05 DIAGNOSIS — N2581 Secondary hyperparathyroidism of renal origin: Secondary | ICD-10-CM | POA: Diagnosis not present

## 2023-05-05 DIAGNOSIS — Z992 Dependence on renal dialysis: Secondary | ICD-10-CM | POA: Diagnosis not present

## 2023-05-05 DIAGNOSIS — T7840XA Allergy, unspecified, initial encounter: Secondary | ICD-10-CM | POA: Diagnosis not present

## 2023-05-05 DIAGNOSIS — D631 Anemia in chronic kidney disease: Secondary | ICD-10-CM | POA: Diagnosis not present

## 2023-05-05 DIAGNOSIS — D509 Iron deficiency anemia, unspecified: Secondary | ICD-10-CM | POA: Diagnosis not present

## 2023-05-05 NOTE — Addendum Note (Signed)
Addended by: Jonah Blue B on: 05/05/2023 07:25 AM   Modules accepted: Orders

## 2023-05-05 NOTE — Telephone Encounter (Signed)
Called & spoke to the patient. Verified name & DOB. Informed that a podiatry referral has been placed. Patient expressed verbal understanding.

## 2023-05-06 DIAGNOSIS — I129 Hypertensive chronic kidney disease with stage 1 through stage 4 chronic kidney disease, or unspecified chronic kidney disease: Secondary | ICD-10-CM | POA: Diagnosis not present

## 2023-05-06 DIAGNOSIS — Z992 Dependence on renal dialysis: Secondary | ICD-10-CM | POA: Diagnosis not present

## 2023-05-06 DIAGNOSIS — N186 End stage renal disease: Secondary | ICD-10-CM | POA: Diagnosis not present

## 2023-05-07 DIAGNOSIS — Z992 Dependence on renal dialysis: Secondary | ICD-10-CM | POA: Diagnosis not present

## 2023-05-07 DIAGNOSIS — D631 Anemia in chronic kidney disease: Secondary | ICD-10-CM | POA: Diagnosis not present

## 2023-05-07 DIAGNOSIS — L299 Pruritus, unspecified: Secondary | ICD-10-CM | POA: Diagnosis not present

## 2023-05-07 DIAGNOSIS — N2581 Secondary hyperparathyroidism of renal origin: Secondary | ICD-10-CM | POA: Diagnosis not present

## 2023-05-07 DIAGNOSIS — N186 End stage renal disease: Secondary | ICD-10-CM | POA: Diagnosis not present

## 2023-05-07 DIAGNOSIS — D509 Iron deficiency anemia, unspecified: Secondary | ICD-10-CM | POA: Diagnosis not present

## 2023-05-07 DIAGNOSIS — J449 Chronic obstructive pulmonary disease, unspecified: Secondary | ICD-10-CM | POA: Diagnosis not present

## 2023-05-07 DIAGNOSIS — T7840XA Allergy, unspecified, initial encounter: Secondary | ICD-10-CM | POA: Diagnosis not present

## 2023-05-07 DIAGNOSIS — G4733 Obstructive sleep apnea (adult) (pediatric): Secondary | ICD-10-CM | POA: Diagnosis not present

## 2023-05-10 DIAGNOSIS — D631 Anemia in chronic kidney disease: Secondary | ICD-10-CM | POA: Diagnosis not present

## 2023-05-10 DIAGNOSIS — L299 Pruritus, unspecified: Secondary | ICD-10-CM | POA: Diagnosis not present

## 2023-05-10 DIAGNOSIS — T7840XA Allergy, unspecified, initial encounter: Secondary | ICD-10-CM | POA: Diagnosis not present

## 2023-05-10 DIAGNOSIS — N2581 Secondary hyperparathyroidism of renal origin: Secondary | ICD-10-CM | POA: Diagnosis not present

## 2023-05-10 DIAGNOSIS — D509 Iron deficiency anemia, unspecified: Secondary | ICD-10-CM | POA: Diagnosis not present

## 2023-05-10 DIAGNOSIS — Z992 Dependence on renal dialysis: Secondary | ICD-10-CM | POA: Diagnosis not present

## 2023-05-10 DIAGNOSIS — N186 End stage renal disease: Secondary | ICD-10-CM | POA: Diagnosis not present

## 2023-05-12 DIAGNOSIS — L299 Pruritus, unspecified: Secondary | ICD-10-CM | POA: Diagnosis not present

## 2023-05-12 DIAGNOSIS — D509 Iron deficiency anemia, unspecified: Secondary | ICD-10-CM | POA: Diagnosis not present

## 2023-05-12 DIAGNOSIS — Z992 Dependence on renal dialysis: Secondary | ICD-10-CM | POA: Diagnosis not present

## 2023-05-12 DIAGNOSIS — T7840XA Allergy, unspecified, initial encounter: Secondary | ICD-10-CM | POA: Diagnosis not present

## 2023-05-12 DIAGNOSIS — N186 End stage renal disease: Secondary | ICD-10-CM | POA: Diagnosis not present

## 2023-05-12 DIAGNOSIS — D631 Anemia in chronic kidney disease: Secondary | ICD-10-CM | POA: Diagnosis not present

## 2023-05-12 DIAGNOSIS — N2581 Secondary hyperparathyroidism of renal origin: Secondary | ICD-10-CM | POA: Diagnosis not present

## 2023-05-14 DIAGNOSIS — N186 End stage renal disease: Secondary | ICD-10-CM | POA: Diagnosis not present

## 2023-05-14 DIAGNOSIS — L299 Pruritus, unspecified: Secondary | ICD-10-CM | POA: Diagnosis not present

## 2023-05-14 DIAGNOSIS — D631 Anemia in chronic kidney disease: Secondary | ICD-10-CM | POA: Diagnosis not present

## 2023-05-14 DIAGNOSIS — T7840XA Allergy, unspecified, initial encounter: Secondary | ICD-10-CM | POA: Diagnosis not present

## 2023-05-14 DIAGNOSIS — Z992 Dependence on renal dialysis: Secondary | ICD-10-CM | POA: Diagnosis not present

## 2023-05-14 DIAGNOSIS — N2581 Secondary hyperparathyroidism of renal origin: Secondary | ICD-10-CM | POA: Diagnosis not present

## 2023-05-14 DIAGNOSIS — D509 Iron deficiency anemia, unspecified: Secondary | ICD-10-CM | POA: Diagnosis not present

## 2023-05-17 ENCOUNTER — Ambulatory Visit: Payer: 59

## 2023-05-17 DIAGNOSIS — D509 Iron deficiency anemia, unspecified: Secondary | ICD-10-CM | POA: Diagnosis not present

## 2023-05-17 DIAGNOSIS — T7840XA Allergy, unspecified, initial encounter: Secondary | ICD-10-CM | POA: Diagnosis not present

## 2023-05-17 DIAGNOSIS — L299 Pruritus, unspecified: Secondary | ICD-10-CM | POA: Diagnosis not present

## 2023-05-17 DIAGNOSIS — D631 Anemia in chronic kidney disease: Secondary | ICD-10-CM | POA: Diagnosis not present

## 2023-05-17 DIAGNOSIS — Z992 Dependence on renal dialysis: Secondary | ICD-10-CM | POA: Diagnosis not present

## 2023-05-17 DIAGNOSIS — N2581 Secondary hyperparathyroidism of renal origin: Secondary | ICD-10-CM | POA: Diagnosis not present

## 2023-05-17 DIAGNOSIS — N186 End stage renal disease: Secondary | ICD-10-CM | POA: Diagnosis not present

## 2023-05-17 NOTE — Patient Outreach (Signed)
  Care Coordination   Follow Up Visit Note   05/17/2023 Name: Victor Thompson MRN: [id number] DOB: 03/26/1961  Victor Thompson is a 62 y.o. year old male who sees [name], MD for primary care. I spoke with  [name] by phone today.  What matters to the patients health and wellness today?  Patient would like to speak with his health plan about a denial for a cardiac PET scan.     Goals Addressed             This Visit's Progress    To manage prediabetes       Care Coordination Interventions: Discussed with patient, PCP referral for Podiatry  Reviewed with patient his scheduled/upcoming Podiatry appointment with Dr. Irving Shows scheduled for 05/19/23 @2 :00 PM, confirmed patient has transportation for his appointment Determined patient's Cardiac PET scan per Dr. Shirlee Latch was denied by his health plan x 2 Instructed patient to contact [facility] to speak with a CM to assist with checking into the denials and next steps and patient verbalizes understanding Reviewed with patient his next scheduled upcoming PCP follow up with Dr. Laural Benes set for 06/27/23 @2 :10 PM    Interventions Today    Flowsheet Row Most Recent Value  Chronic Disease   Chronic disease during today's visit Diabetes, Other  [podiatry]  General Interventions   General Interventions Discussed/Reviewed General Interventions Discussed, General Interventions Reviewed, Doctor Visits  Doctor Visits Discussed/Reviewed Doctor Visits Discussed, Doctor Visits Reviewed, Specialist  Education Interventions   Education Provided Provided Education  Provided Verbal Education On When to see the doctor          SDOH assessments and interventions completed:  No     Care Coordination Interventions:  Yes, provided   Follow up plan: Follow up call scheduled for 06/29/23 @11 :30 AM    Encounter Outcome:  Pt. Visit Completed

## 2023-05-17 NOTE — Patient Instructions (Signed)
Visit Information  Thank you for taking time to visit with me today. Please don't hesitate to contact me if I can be of assistance to you.   Following are the goals we discussed today:   Goals Addressed             This Visit's Progress    To manage prediabetes       Care Coordination Interventions: Discussed with patient, PCP referral for Podiatry  Reviewed with patient his scheduled/upcoming Podiatry appointment with Dr. Irving Shows scheduled for 05/19/23 @2 :00 PM, confirmed patient has transportation for his appointment Determined patient's Cardiac PET scan per Dr. Shirlee Latch was denied by his health plan x 2 Instructed patient to contact [facility] to speak with a CM to assist with checking into the denials and next steps and patient verbalizes understanding Reviewed with patient his next scheduled upcoming PCP follow up with Dr. Laural Benes set for 06/27/23 @2 :10 PM        Our next appointment is by telephone on 06/29/23 at 11:30 AM  Please call the care guide team at [phone] if you need to cancel or reschedule your appointment.   If you are experiencing a Mental Health or Behavioral Health Crisis or need someone to talk to, please call 1-800-273-TALK (toll free, 24 hour hotline)  Patient verbalizes understanding of instructions and care plan provided today and agrees to view in MyChart. Active MyChart status and patient understanding of how to access instructions and care plan via MyChart confirmed with patient.     Delsa Sale, RN, BSN, CCM Care Management Coordinator [facility] Care Management Direct Phone: [phone]

## 2023-05-19 ENCOUNTER — Ambulatory Visit (INDEPENDENT_AMBULATORY_CARE_PROVIDER_SITE_OTHER): Payer: 59 | Admitting: Podiatrist

## 2023-05-19 ENCOUNTER — Encounter: Payer: Self-pay | Admitting: Podiatrist

## 2023-05-19 DIAGNOSIS — M79674 Pain in right toe(s): Secondary | ICD-10-CM

## 2023-05-19 DIAGNOSIS — T7840XA Allergy, unspecified, initial encounter: Secondary | ICD-10-CM | POA: Diagnosis not present

## 2023-05-19 DIAGNOSIS — L299 Pruritus, unspecified: Secondary | ICD-10-CM | POA: Diagnosis not present

## 2023-05-19 DIAGNOSIS — D631 Anemia in chronic kidney disease: Secondary | ICD-10-CM | POA: Diagnosis not present

## 2023-05-19 DIAGNOSIS — M79675 Pain in left toe(s): Secondary | ICD-10-CM | POA: Diagnosis not present

## 2023-05-19 DIAGNOSIS — B351 Tinea unguium: Secondary | ICD-10-CM | POA: Diagnosis not present

## 2023-05-19 DIAGNOSIS — D509 Iron deficiency anemia, unspecified: Secondary | ICD-10-CM | POA: Diagnosis not present

## 2023-05-19 DIAGNOSIS — N186 End stage renal disease: Secondary | ICD-10-CM | POA: Diagnosis not present

## 2023-05-19 DIAGNOSIS — Z992 Dependence on renal dialysis: Secondary | ICD-10-CM | POA: Diagnosis not present

## 2023-05-19 DIAGNOSIS — N2581 Secondary hyperparathyroidism of renal origin: Secondary | ICD-10-CM | POA: Diagnosis not present

## 2023-05-19 NOTE — Progress Notes (Signed)
Subjective: Victor Thompson is a 62 y.o. male patient who presents to office today with concern of long,mildly painful nails  while ambulating in shoes; unable to trim.  Patient denies any new cramping, numbness, burning or tingling in the legs or feet.  He is on dialysis.  PCP:  [name], MD  Patient Active Problem List   Diagnosis Date Noted   ICD (implantable cardioverter-defibrillator) in place 11/05/2022   Paroxysmal atrial fibrillation (HCC)    Nausea and vomiting    Paroxysmal ventricular tachycardia (HCC) 08/24/2022   VT (ventricular tachycardia) (HCC) 08/16/2022   Chronic obstructive pulmonary disease, unspecified COPD type (HCC) 06/18/2022   AICD discharge 05/25/2022   Seasonal and perennial allergic rhinitis 11/12/2021   Moderate persistent asthma, uncomplicated 11/12/2021   STD exposure 08/27/2021   ESRD on dialysis (HCC) 02/06/2021   Hypervolemia associated with renal insufficiency 01/12/2021   Angioedema 09/22/2020   Secondary hyperparathyroidism of renal origin (HCC) 07/15/2019   Vitamin D deficiency 07/15/2019   CAD (coronary artery disease), native coronary artery 04/10/2019   Chronic midline low back pain without sciatica 02/17/2018   Ventricular tachycardia (HCC) 03/29/2016   Defibrillator discharge    HFrEF (heart failure with reduced ejection fraction) (HCC) 01/22/2016   Erectile dysfunction 01/22/2016   Allergic rhinitis 01/22/2016   GERD (gastroesophageal reflux disease) 01/22/2016   PROTEINURIA 02/17/2010   Dilated cardiomyopathy (HCC) 02/16/2010   Obstructive sleep apnea 02/16/2010   EXTERNAL HEMORRHOIDS 09/26/2009   Tobacco use disorder in remission 06/03/2009   Essential hypertension 10/01/2008   Hyperlipidemia 07/14/2007   Class 3 severe obesity due to excess calories with serious comorbidity and body mass index (BMI) of 40.0 to 44.9 in adult [facility]) 07/14/2007   Asthma 07/14/2007   Current Outpatient Medications on File Prior to Visit   Medication Sig Dispense Refill   albuterol (PROVENTIL) (2.5 MG/3ML) 0.083% nebulizer solution INHALE THE CONTENTS OF 1 VIAL BY MOUTH EVERY 6 HOURS AS NEEDED 675 mL 0   albuterol (VENTOLIN HFA) 108 (90 Base) MCG/ACT inhaler INHALE TWO PUFFS BY MOUTH EVERY 4 HOURS AS NEEDED 60.3 g 2   amiodarone (PACERONE) 200 MG tablet Take 1 tablet (200 mg total) by mouth 2 (two) times daily. 60 tablet 2   apixaban (ELIQUIS) 5 MG TABS tablet NEW PRESCRIPTION REQUEST: Eliquis 5 Mg - TAKE ONE TABLET BY MOUTH TWICE DAILY 180 tablet 3   atorvastatin (LIPITOR) 80 MG tablet Take 1 tablet (80 mg total) by mouth at bedtime. 90 tablet 3   budesonide-formoterol (SYMBICORT) 80-4.5 MCG/ACT inhaler INHALE TWO PUFFS BY MOUTH TWICE DAILY 30.6 g 0   calcium acetate (PHOSLO) 667 MG capsule Take 2 capsule by mouth three times a day with meals 180 capsule 6   carvedilol (COREG) 12.5 MG tablet Carvedilol 12.5 Mg- TAKE ONE TABLET BY MOUTH TWICE DAILY Do not take on dialysis days Tuesday Thursday Saturdays 180 tablet 3   cetirizine (ZYRTEC) 10 MG tablet Take 1 tablet (10 mg total) by mouth 2 (two) times daily. 180 tablet 1   Cholecalciferol 125 MCG (5000 UT) TABS Take 1 tablet (5,000 Units total) by mouth daily. 90 tablet 1   docusate sodium (COLACE) 100 MG capsule TAKE ONE CAPSULE BY MOUTH DAILY 90 capsule 0   EPINEPHrine 0.3 mg/0.3 mL IJ SOAJ injection Inject 0.3 mg into the muscle once as needed for up to 2 doses (if worsening tongue swelling, SOB, hypoxia, or other concerns for progressive anaphylaxis). 2 each 2   ethyl chloride spray SPRAY  A SMALL AMOUNT THREE TIMES A WEEK JUST PRIOR TO NEEDLE INSERTION 116 mL 5   fluticasone (FLONASE) 50 MCG/ACT nasal spray INSTILL 1 SPRAY IN EACH NOSTRIL DAILY AS NEEDED 47.4 mL 0   hydrOXYzine (ATARAX) 25 MG tablet TAKE 1 TO 2 TABLETS (25-50 MG TOTAL) BY MOUTH AT BEDTIME AS NEEDED FOR ANXIETY/INSOMNIA. 60 tablet 1   linaclotide (LINZESS) 72 MCG capsule TAKE 1 CAPSULE (72 MCG TOTAL) BY MOUTH EVERY  MONDAY, WEDNESDAY, FRIDAY, AND SATURDAY. 64 capsule 1   mexiletine (MEXITIL) 150 MG capsule NEW PRESCRIPTION REQUEST: Mexiletine 150 Mg- TAKE TWO CAPSULES BY MOUTH TWICE DAILY 360 capsule 3   pantoprazole (PROTONIX) 40 MG tablet NEW PRESCRIPTION REQUEST: Pantoprazole Sod Dr 40 Mg - TAKE ONE TABLET BY MOUTH DAILY 90 tablet 3   PEG-KCl-NaCl-NaSulf-Na Asc-C (PLENVU) 140 g SOLR Take 1 kit by mouth as directed. (Patient not taking: Reported on 02/25/2023) 3 each 0   Polyethylene Glycol 3350 (PEG 3350) 17 g PACK NEW PRESCRIPTION REQUEST: Polyethylene Glycol 3350- 1 PACKET BY MOUTH DAILY AS NEEDED (Patient not taking: Reported on 02/25/2023) 90 packet 0   predniSONE (DELTASONE) 10 MG tablet TAKE ONE TABLET BY MOUTH ONCE DAILY WITH BREAKFAST 30 tablet 6   Rectal Protectant-Emollient (CALMOL-4) 76-10 % SUPP Use as directed once to twice daily 8 suppository 0   sildenafil (VIAGRA) 50 MG tablet Take 50 mg by mouth daily as needed.     tadalafil (CIALIS) 10 MG tablet Take 1 tablet (10 mg total) by mouth daily as needed 30 min prior to sexual activity.  Do not take with Imdur (isosorbide) or nitroglycerin 30 tablet 1   VELPHORO 500 MG chewable tablet Chew 1,000 mg by mouth 3 (three) times daily.     No current facility-administered medications on file prior to visit.   Allergies  Allergen Reactions   Ace Inhibitors Swelling    Swelling of the tongue   Influenza Vaccines Hives and Swelling    SWELLING REACTION UNSPECIFIED    Ketorolac Swelling    SWELLING REACTION UNSPECIFIED    Lidocaine Swelling    TONGUE SWELLS   Lisinopril Swelling    TONGUE SWELLING Pt reported problem with a BP med which sounded like  lisinopril  But as of 09/19/06,pt had tolerated altace without problem   Penicillins Swelling    TONGUE SWELLS Has patient had a PCN reaction causing immediate rash, facial/tongue/throat swelling, SOB or lightheadedness with hypotension: Yes Has patient had a PCN reaction causing severe rash  involving mucus membranes or skin necrosis: No Has patient had a PCN reaction that required hospitalization No Has patient had a PCN reaction occurring within the last 10 years: Yes If all of the above answers are "NO", then may proceed with Cephalosporin use.      Objective: General: Patient is awake, alert, and oriented x 3 and in no acute distress.  Integument: Skin is warm, dry and supple bilateral. Nails are tender, long, thickened and  dystrophic with subungual debris, consistent with onychomycosis, 1-5 bilateral. No signs of bacterial infection. No open lesions or preulcerative lesions present bilateral. Remaining integument unremarkable.  Vasculature:  Dorsalis Pedis pulse 2/4 bilateral. Posterior Tibial pulse  1/4 bilateral.  Capillary fill time <3 sec 1-5 bilateral. Positive hair growth to the level of the digits. Temperature gradient within normal limits. No varicosities present bilateral. No edema present bilateral.   Neurology: The patient has intact sensation measured with a 5.07/10g Semmes Weinstein Monofilament at all pedal sites bilateral . Vibratory sensation  diminished bilateral with tuning fork. No Babinski sign present bilateral.   Musculoskeletal: No symptomatic pedal deformities noted bilateral. Muscular strength 5/5 in all lower extremity muscular groups bilateral without pain on range of motion . No tenderness with calf compression bilateral.  Assessment and Plan:   ICD-10-CM   1. Pain due to onychomycosis of toenails of both feet  B35.1    M79.675    M79.674        -Examined patient. -Mechanically debrided all nails 1-5 bilateral using sterile nail nipper and filed with sterile dremel without incident  -Answered all patient questions -Patient to return  in 3 months for continued foot care.  -Patient advised to call the office if any problems or questions arise in the meantime.  Delories Heinz, DPM

## 2023-05-20 ENCOUNTER — Other Ambulatory Visit: Payer: Self-pay | Admitting: Internal Medicine

## 2023-05-20 NOTE — Telephone Encounter (Signed)
Requested Prescriptions  Pending Prescriptions Disp Refills   budesonide-formoterol (SYMBICORT) 80-4.5 MCG/ACT inhaler [Pharmacy Med Name: Symbicort 80 mcg-4.5 mcg/actuation HFA aerosol inhaler] 10.2 g 0    Sig: INHALE TWO PUFFS BY MOUTH INTO THE LUNGS TWICE DAILY     Pulmonology:  Combination Products Passed - 05/20/2023 10:31 AM      Passed - Valid encounter within last 12 months    Recent Outpatient Visits           2 months ago Chronic combined systolic and diastolic CHF (congestive heart failure) (HCC)   [facility] & [facility] Jonah Blue B, MD   7 months ago Chronic systolic congestive heart failure, NYHA class 2 [facility])   [facility] [name], MD   8 months ago Hospital discharge follow-up   [facility] [name], MD   9 months ago Anxiety about health   [id number] at [facility], [place], [place]   11 months ago Essential hypertension    [facility] [name], MD       Future Appointments             In 1 month Laural Benes, Binnie Rail, MD [facility]

## 2023-05-21 DIAGNOSIS — N2581 Secondary hyperparathyroidism of renal origin: Secondary | ICD-10-CM | POA: Diagnosis not present

## 2023-05-21 DIAGNOSIS — N186 End stage renal disease: Secondary | ICD-10-CM | POA: Diagnosis not present

## 2023-05-21 DIAGNOSIS — D509 Iron deficiency anemia, unspecified: Secondary | ICD-10-CM | POA: Diagnosis not present

## 2023-05-21 DIAGNOSIS — Z992 Dependence on renal dialysis: Secondary | ICD-10-CM | POA: Diagnosis not present

## 2023-05-21 DIAGNOSIS — T7840XA Allergy, unspecified, initial encounter: Secondary | ICD-10-CM | POA: Diagnosis not present

## 2023-05-21 DIAGNOSIS — D631 Anemia in chronic kidney disease: Secondary | ICD-10-CM | POA: Diagnosis not present

## 2023-05-21 DIAGNOSIS — L299 Pruritus, unspecified: Secondary | ICD-10-CM | POA: Diagnosis not present

## 2023-05-23 DIAGNOSIS — G4733 Obstructive sleep apnea (adult) (pediatric): Secondary | ICD-10-CM | POA: Diagnosis not present

## 2023-05-24 DIAGNOSIS — Z992 Dependence on renal dialysis: Secondary | ICD-10-CM | POA: Diagnosis not present

## 2023-05-24 DIAGNOSIS — L299 Pruritus, unspecified: Secondary | ICD-10-CM | POA: Diagnosis not present

## 2023-05-24 DIAGNOSIS — T7840XA Allergy, unspecified, initial encounter: Secondary | ICD-10-CM | POA: Diagnosis not present

## 2023-05-24 DIAGNOSIS — D509 Iron deficiency anemia, unspecified: Secondary | ICD-10-CM | POA: Diagnosis not present

## 2023-05-24 DIAGNOSIS — N2581 Secondary hyperparathyroidism of renal origin: Secondary | ICD-10-CM | POA: Diagnosis not present

## 2023-05-24 DIAGNOSIS — D631 Anemia in chronic kidney disease: Secondary | ICD-10-CM | POA: Diagnosis not present

## 2023-05-24 DIAGNOSIS — N186 End stage renal disease: Secondary | ICD-10-CM | POA: Diagnosis not present

## 2023-05-26 ENCOUNTER — Other Ambulatory Visit (HOSPITAL_COMMUNITY): Payer: Self-pay

## 2023-05-26 ENCOUNTER — Other Ambulatory Visit: Payer: Self-pay

## 2023-05-26 ENCOUNTER — Other Ambulatory Visit: Payer: Self-pay | Admitting: Internal Medicine

## 2023-05-26 DIAGNOSIS — N186 End stage renal disease: Secondary | ICD-10-CM | POA: Diagnosis not present

## 2023-05-26 DIAGNOSIS — Z992 Dependence on renal dialysis: Secondary | ICD-10-CM | POA: Diagnosis not present

## 2023-05-26 DIAGNOSIS — D631 Anemia in chronic kidney disease: Secondary | ICD-10-CM | POA: Diagnosis not present

## 2023-05-26 DIAGNOSIS — T7840XA Allergy, unspecified, initial encounter: Secondary | ICD-10-CM | POA: Diagnosis not present

## 2023-05-26 DIAGNOSIS — N2581 Secondary hyperparathyroidism of renal origin: Secondary | ICD-10-CM | POA: Diagnosis not present

## 2023-05-26 DIAGNOSIS — L299 Pruritus, unspecified: Secondary | ICD-10-CM | POA: Diagnosis not present

## 2023-05-26 DIAGNOSIS — D509 Iron deficiency anemia, unspecified: Secondary | ICD-10-CM | POA: Diagnosis not present

## 2023-05-26 MED ORDER — LANTHANUM CARBONATE 1000 MG PO CHEW: CHEWABLE_TABLET | ORAL | 3 refills | Status: DC

## 2023-05-27 ENCOUNTER — Other Ambulatory Visit: Payer: Self-pay

## 2023-05-27 ENCOUNTER — Telehealth (HOSPITAL_COMMUNITY): Payer: Self-pay | Admitting: *Deleted

## 2023-05-27 ENCOUNTER — Other Ambulatory Visit: Payer: Self-pay | Admitting: Internal Medicine

## 2023-05-27 NOTE — Telephone Encounter (Signed)
Medication Refill - Medication: albuterol (PROVENTIL) (2.5 MG/3ML) 0.083% nebulizer solution   Has the patient contacted their pharmacy? Pharmacy called in directly (Agent: If no, request that the patient contact the pharmacy for the refill. If patient does not wish to contact the pharmacy document the reason why and proceed with request.) (Agent: If yes, when and what did the pharmacy advise?)pharmacy called in  Preferred Pharmacy (with phone number or street name):  [facility] - [address] [place], [place] - [address]ure[address] Phone: [phone]  Fax: [phone]     Has the patient been seen for an appointment in the last year OR does the patient have an upcoming appointment? yes  Agent: Please be advised that RX refills may take up to 3 business days. We ask that you follow-up with your pharmacy.

## 2023-05-28 DIAGNOSIS — N186 End stage renal disease: Secondary | ICD-10-CM | POA: Diagnosis not present

## 2023-05-28 DIAGNOSIS — T7840XA Allergy, unspecified, initial encounter: Secondary | ICD-10-CM | POA: Diagnosis not present

## 2023-05-28 DIAGNOSIS — N2581 Secondary hyperparathyroidism of renal origin: Secondary | ICD-10-CM | POA: Diagnosis not present

## 2023-05-28 DIAGNOSIS — Z992 Dependence on renal dialysis: Secondary | ICD-10-CM | POA: Diagnosis not present

## 2023-05-28 DIAGNOSIS — L299 Pruritus, unspecified: Secondary | ICD-10-CM | POA: Diagnosis not present

## 2023-05-28 DIAGNOSIS — D509 Iron deficiency anemia, unspecified: Secondary | ICD-10-CM | POA: Diagnosis not present

## 2023-05-28 DIAGNOSIS — D631 Anemia in chronic kidney disease: Secondary | ICD-10-CM | POA: Diagnosis not present

## 2023-05-30 MED ORDER — ALBUTEROL SULFATE (2.5 MG/3ML) 0.083% IN NEBU: INHALATION_SOLUTION | RESPIRATORY_TRACT | 0 refills | Status: DC

## 2023-05-30 NOTE — Telephone Encounter (Signed)
Requested Prescriptions  Pending Prescriptions Disp Refills   albuterol (PROVENTIL) (2.5 MG/3ML) 0.083% nebulizer solution 675 mL 0    Sig: INHALE THE CONTENTS OF 1 VIAL BY MOUTH EVERY 6 HOURS AS NEEDED     Pulmonology:  Beta Agonists 2 Passed - 05/27/2023  4:10 PM      Passed - Last BP in normal range    BP Readings from Last 1 Encounters:  02/25/23 128/88         Passed - Last Heart Rate in normal range    Pulse Readings from Last 1 Encounters:  02/25/23 75         Passed - Valid encounter within last 12 months    Recent Outpatient Visits           3 months ago Chronic combined systolic and diastolic CHF (congestive heart failure) (HCC)   [facility] Jonah Blue B, MD   7 months ago Chronic systolic congestive heart failure, NYHA class 2 [facility])   [facility] [name], MD   8 months ago Hospital discharge follow-up   [facility] [name], MD   9 months ago Anxiety about health   [facility] at [facility], [place], [place]   11 months ago Essential hypertension   [facility] [name], MD       Future Appointments             In 4 weeks [name], MD [facility] & [facility]

## 2023-05-31 DIAGNOSIS — L299 Pruritus, unspecified: Secondary | ICD-10-CM | POA: Diagnosis not present

## 2023-05-31 DIAGNOSIS — T7840XA Allergy, unspecified, initial encounter: Secondary | ICD-10-CM | POA: Diagnosis not present

## 2023-05-31 DIAGNOSIS — D631 Anemia in chronic kidney disease: Secondary | ICD-10-CM | POA: Diagnosis not present

## 2023-05-31 DIAGNOSIS — N2581 Secondary hyperparathyroidism of renal origin: Secondary | ICD-10-CM | POA: Diagnosis not present

## 2023-05-31 DIAGNOSIS — Z992 Dependence on renal dialysis: Secondary | ICD-10-CM | POA: Diagnosis not present

## 2023-05-31 DIAGNOSIS — N186 End stage renal disease: Secondary | ICD-10-CM | POA: Diagnosis not present

## 2023-05-31 DIAGNOSIS — D509 Iron deficiency anemia, unspecified: Secondary | ICD-10-CM | POA: Diagnosis not present

## 2023-06-02 DIAGNOSIS — N186 End stage renal disease: Secondary | ICD-10-CM | POA: Diagnosis not present

## 2023-06-02 DIAGNOSIS — N2581 Secondary hyperparathyroidism of renal origin: Secondary | ICD-10-CM | POA: Diagnosis not present

## 2023-06-02 DIAGNOSIS — Z992 Dependence on renal dialysis: Secondary | ICD-10-CM | POA: Diagnosis not present

## 2023-06-02 DIAGNOSIS — L299 Pruritus, unspecified: Secondary | ICD-10-CM | POA: Diagnosis not present

## 2023-06-02 DIAGNOSIS — D631 Anemia in chronic kidney disease: Secondary | ICD-10-CM | POA: Diagnosis not present

## 2023-06-02 DIAGNOSIS — T7840XA Allergy, unspecified, initial encounter: Secondary | ICD-10-CM | POA: Diagnosis not present

## 2023-06-02 DIAGNOSIS — D509 Iron deficiency anemia, unspecified: Secondary | ICD-10-CM | POA: Diagnosis not present

## 2023-06-04 DIAGNOSIS — N186 End stage renal disease: Secondary | ICD-10-CM | POA: Diagnosis not present

## 2023-06-04 DIAGNOSIS — D631 Anemia in chronic kidney disease: Secondary | ICD-10-CM | POA: Diagnosis not present

## 2023-06-04 DIAGNOSIS — N2581 Secondary hyperparathyroidism of renal origin: Secondary | ICD-10-CM | POA: Diagnosis not present

## 2023-06-04 DIAGNOSIS — Z992 Dependence on renal dialysis: Secondary | ICD-10-CM | POA: Diagnosis not present

## 2023-06-04 DIAGNOSIS — T7840XA Allergy, unspecified, initial encounter: Secondary | ICD-10-CM | POA: Diagnosis not present

## 2023-06-04 DIAGNOSIS — L299 Pruritus, unspecified: Secondary | ICD-10-CM | POA: Diagnosis not present

## 2023-06-04 DIAGNOSIS — D509 Iron deficiency anemia, unspecified: Secondary | ICD-10-CM | POA: Diagnosis not present

## 2023-06-05 DIAGNOSIS — N186 End stage renal disease: Secondary | ICD-10-CM | POA: Diagnosis not present

## 2023-06-05 DIAGNOSIS — I129 Hypertensive chronic kidney disease with stage 1 through stage 4 chronic kidney disease, or unspecified chronic kidney disease: Secondary | ICD-10-CM | POA: Diagnosis not present

## 2023-06-05 DIAGNOSIS — Z992 Dependence on renal dialysis: Secondary | ICD-10-CM | POA: Diagnosis not present

## 2023-06-07 DIAGNOSIS — L299 Pruritus, unspecified: Secondary | ICD-10-CM | POA: Diagnosis not present

## 2023-06-07 DIAGNOSIS — D631 Anemia in chronic kidney disease: Secondary | ICD-10-CM | POA: Diagnosis not present

## 2023-06-07 DIAGNOSIS — Z992 Dependence on renal dialysis: Secondary | ICD-10-CM | POA: Diagnosis not present

## 2023-06-07 DIAGNOSIS — N2581 Secondary hyperparathyroidism of renal origin: Secondary | ICD-10-CM | POA: Diagnosis not present

## 2023-06-07 DIAGNOSIS — T7840XA Allergy, unspecified, initial encounter: Secondary | ICD-10-CM | POA: Diagnosis not present

## 2023-06-07 DIAGNOSIS — N186 End stage renal disease: Secondary | ICD-10-CM | POA: Diagnosis not present

## 2023-06-07 DIAGNOSIS — T782XXA Anaphylactic shock, unspecified, initial encounter: Secondary | ICD-10-CM | POA: Diagnosis not present

## 2023-06-07 DIAGNOSIS — D509 Iron deficiency anemia, unspecified: Secondary | ICD-10-CM | POA: Diagnosis not present

## 2023-06-09 DIAGNOSIS — T7840XA Allergy, unspecified, initial encounter: Secondary | ICD-10-CM | POA: Diagnosis not present

## 2023-06-09 DIAGNOSIS — T782XXA Anaphylactic shock, unspecified, initial encounter: Secondary | ICD-10-CM | POA: Diagnosis not present

## 2023-06-09 DIAGNOSIS — D631 Anemia in chronic kidney disease: Secondary | ICD-10-CM | POA: Diagnosis not present

## 2023-06-09 DIAGNOSIS — D509 Iron deficiency anemia, unspecified: Secondary | ICD-10-CM | POA: Diagnosis not present

## 2023-06-09 DIAGNOSIS — N2581 Secondary hyperparathyroidism of renal origin: Secondary | ICD-10-CM | POA: Diagnosis not present

## 2023-06-09 DIAGNOSIS — Z992 Dependence on renal dialysis: Secondary | ICD-10-CM | POA: Diagnosis not present

## 2023-06-09 DIAGNOSIS — L299 Pruritus, unspecified: Secondary | ICD-10-CM | POA: Diagnosis not present

## 2023-06-09 DIAGNOSIS — N186 End stage renal disease: Secondary | ICD-10-CM | POA: Diagnosis not present

## 2023-06-11 DIAGNOSIS — N186 End stage renal disease: Secondary | ICD-10-CM | POA: Diagnosis not present

## 2023-06-11 DIAGNOSIS — Z992 Dependence on renal dialysis: Secondary | ICD-10-CM | POA: Diagnosis not present

## 2023-06-11 DIAGNOSIS — D509 Iron deficiency anemia, unspecified: Secondary | ICD-10-CM | POA: Diagnosis not present

## 2023-06-11 DIAGNOSIS — N2581 Secondary hyperparathyroidism of renal origin: Secondary | ICD-10-CM | POA: Diagnosis not present

## 2023-06-11 DIAGNOSIS — D631 Anemia in chronic kidney disease: Secondary | ICD-10-CM | POA: Diagnosis not present

## 2023-06-11 DIAGNOSIS — T782XXA Anaphylactic shock, unspecified, initial encounter: Secondary | ICD-10-CM | POA: Diagnosis not present

## 2023-06-11 DIAGNOSIS — L299 Pruritus, unspecified: Secondary | ICD-10-CM | POA: Diagnosis not present

## 2023-06-11 DIAGNOSIS — T7840XA Allergy, unspecified, initial encounter: Secondary | ICD-10-CM | POA: Diagnosis not present

## 2023-06-14 ENCOUNTER — Other Ambulatory Visit: Payer: Self-pay | Admitting: Nephrology

## 2023-06-14 DIAGNOSIS — L299 Pruritus, unspecified: Secondary | ICD-10-CM | POA: Diagnosis not present

## 2023-06-14 DIAGNOSIS — D509 Iron deficiency anemia, unspecified: Secondary | ICD-10-CM | POA: Diagnosis not present

## 2023-06-14 DIAGNOSIS — D631 Anemia in chronic kidney disease: Secondary | ICD-10-CM | POA: Diagnosis not present

## 2023-06-14 DIAGNOSIS — Z992 Dependence on renal dialysis: Secondary | ICD-10-CM | POA: Diagnosis not present

## 2023-06-14 DIAGNOSIS — N2581 Secondary hyperparathyroidism of renal origin: Secondary | ICD-10-CM | POA: Diagnosis not present

## 2023-06-14 DIAGNOSIS — T7840XA Allergy, unspecified, initial encounter: Secondary | ICD-10-CM | POA: Diagnosis not present

## 2023-06-14 DIAGNOSIS — T782XXA Anaphylactic shock, unspecified, initial encounter: Secondary | ICD-10-CM | POA: Diagnosis not present

## 2023-06-14 DIAGNOSIS — N186 End stage renal disease: Secondary | ICD-10-CM | POA: Diagnosis not present

## 2023-06-14 MED ORDER — LANTHANUM CARBONATE 1000 MG PO CHEW: CHEWABLE_TABLET | ORAL | 3 refills | Status: DC

## 2023-06-16 ENCOUNTER — Ambulatory Visit: Payer: 59 | Admitting: Dietician

## 2023-06-16 DIAGNOSIS — Z992 Dependence on renal dialysis: Secondary | ICD-10-CM | POA: Diagnosis not present

## 2023-06-16 DIAGNOSIS — N186 End stage renal disease: Secondary | ICD-10-CM | POA: Diagnosis not present

## 2023-06-16 DIAGNOSIS — D631 Anemia in chronic kidney disease: Secondary | ICD-10-CM | POA: Diagnosis not present

## 2023-06-16 DIAGNOSIS — T782XXA Anaphylactic shock, unspecified, initial encounter: Secondary | ICD-10-CM | POA: Diagnosis not present

## 2023-06-16 DIAGNOSIS — T7840XA Allergy, unspecified, initial encounter: Secondary | ICD-10-CM | POA: Diagnosis not present

## 2023-06-16 DIAGNOSIS — L299 Pruritus, unspecified: Secondary | ICD-10-CM | POA: Diagnosis not present

## 2023-06-16 DIAGNOSIS — D509 Iron deficiency anemia, unspecified: Secondary | ICD-10-CM | POA: Diagnosis not present

## 2023-06-16 DIAGNOSIS — N2581 Secondary hyperparathyroidism of renal origin: Secondary | ICD-10-CM | POA: Diagnosis not present

## 2023-06-18 DIAGNOSIS — D509 Iron deficiency anemia, unspecified: Secondary | ICD-10-CM | POA: Diagnosis not present

## 2023-06-18 DIAGNOSIS — T782XXA Anaphylactic shock, unspecified, initial encounter: Secondary | ICD-10-CM | POA: Diagnosis not present

## 2023-06-18 DIAGNOSIS — N2581 Secondary hyperparathyroidism of renal origin: Secondary | ICD-10-CM | POA: Diagnosis not present

## 2023-06-18 DIAGNOSIS — Z992 Dependence on renal dialysis: Secondary | ICD-10-CM | POA: Diagnosis not present

## 2023-06-18 DIAGNOSIS — N186 End stage renal disease: Secondary | ICD-10-CM | POA: Diagnosis not present

## 2023-06-18 DIAGNOSIS — T7840XA Allergy, unspecified, initial encounter: Secondary | ICD-10-CM | POA: Diagnosis not present

## 2023-06-18 DIAGNOSIS — L299 Pruritus, unspecified: Secondary | ICD-10-CM | POA: Diagnosis not present

## 2023-06-18 DIAGNOSIS — D631 Anemia in chronic kidney disease: Secondary | ICD-10-CM | POA: Diagnosis not present

## 2023-06-21 DIAGNOSIS — T7840XA Allergy, unspecified, initial encounter: Secondary | ICD-10-CM | POA: Diagnosis not present

## 2023-06-21 DIAGNOSIS — J449 Chronic obstructive pulmonary disease, unspecified: Secondary | ICD-10-CM | POA: Diagnosis not present

## 2023-06-21 DIAGNOSIS — T782XXA Anaphylactic shock, unspecified, initial encounter: Secondary | ICD-10-CM | POA: Diagnosis not present

## 2023-06-21 DIAGNOSIS — N2581 Secondary hyperparathyroidism of renal origin: Secondary | ICD-10-CM | POA: Diagnosis not present

## 2023-06-21 DIAGNOSIS — L299 Pruritus, unspecified: Secondary | ICD-10-CM | POA: Diagnosis not present

## 2023-06-21 DIAGNOSIS — N186 End stage renal disease: Secondary | ICD-10-CM | POA: Diagnosis not present

## 2023-06-21 DIAGNOSIS — G4733 Obstructive sleep apnea (adult) (pediatric): Secondary | ICD-10-CM | POA: Diagnosis not present

## 2023-06-21 DIAGNOSIS — Z992 Dependence on renal dialysis: Secondary | ICD-10-CM | POA: Diagnosis not present

## 2023-06-21 DIAGNOSIS — D509 Iron deficiency anemia, unspecified: Secondary | ICD-10-CM | POA: Diagnosis not present

## 2023-06-21 DIAGNOSIS — D631 Anemia in chronic kidney disease: Secondary | ICD-10-CM | POA: Diagnosis not present

## 2023-06-22 DIAGNOSIS — G4733 Obstructive sleep apnea (adult) (pediatric): Secondary | ICD-10-CM | POA: Diagnosis not present

## 2023-06-22 DIAGNOSIS — J449 Chronic obstructive pulmonary disease, unspecified: Secondary | ICD-10-CM | POA: Diagnosis not present

## 2023-06-23 ENCOUNTER — Ambulatory Visit: Payer: Self-pay | Admitting: Internal Medicine

## 2023-06-23 DIAGNOSIS — D509 Iron deficiency anemia, unspecified: Secondary | ICD-10-CM | POA: Diagnosis not present

## 2023-06-23 DIAGNOSIS — L299 Pruritus, unspecified: Secondary | ICD-10-CM | POA: Diagnosis not present

## 2023-06-23 DIAGNOSIS — T782XXA Anaphylactic shock, unspecified, initial encounter: Secondary | ICD-10-CM | POA: Diagnosis not present

## 2023-06-23 DIAGNOSIS — N2581 Secondary hyperparathyroidism of renal origin: Secondary | ICD-10-CM | POA: Diagnosis not present

## 2023-06-23 DIAGNOSIS — N186 End stage renal disease: Secondary | ICD-10-CM | POA: Diagnosis not present

## 2023-06-23 DIAGNOSIS — D631 Anemia in chronic kidney disease: Secondary | ICD-10-CM | POA: Diagnosis not present

## 2023-06-23 DIAGNOSIS — Z992 Dependence on renal dialysis: Secondary | ICD-10-CM | POA: Diagnosis not present

## 2023-06-23 DIAGNOSIS — T7840XA Allergy, unspecified, initial encounter: Secondary | ICD-10-CM | POA: Diagnosis not present

## 2023-06-25 DIAGNOSIS — N186 End stage renal disease: Secondary | ICD-10-CM | POA: Diagnosis not present

## 2023-06-25 DIAGNOSIS — Z992 Dependence on renal dialysis: Secondary | ICD-10-CM | POA: Diagnosis not present

## 2023-06-25 DIAGNOSIS — T7840XA Allergy, unspecified, initial encounter: Secondary | ICD-10-CM | POA: Diagnosis not present

## 2023-06-25 DIAGNOSIS — T782XXA Anaphylactic shock, unspecified, initial encounter: Secondary | ICD-10-CM | POA: Diagnosis not present

## 2023-06-25 DIAGNOSIS — L299 Pruritus, unspecified: Secondary | ICD-10-CM | POA: Diagnosis not present

## 2023-06-25 DIAGNOSIS — D509 Iron deficiency anemia, unspecified: Secondary | ICD-10-CM | POA: Diagnosis not present

## 2023-06-25 DIAGNOSIS — D631 Anemia in chronic kidney disease: Secondary | ICD-10-CM | POA: Diagnosis not present

## 2023-06-25 DIAGNOSIS — N2581 Secondary hyperparathyroidism of renal origin: Secondary | ICD-10-CM | POA: Diagnosis not present

## 2023-06-27 ENCOUNTER — Ambulatory Visit: Payer: 59 | Attending: Internal Medicine | Admitting: Internal Medicine

## 2023-06-27 ENCOUNTER — Encounter: Payer: Self-pay | Admitting: Internal Medicine

## 2023-06-27 ENCOUNTER — Other Ambulatory Visit: Payer: Self-pay

## 2023-06-27 ENCOUNTER — Telehealth: Payer: Self-pay | Admitting: Internal Medicine

## 2023-06-27 VITALS — BP 114/77 | HR 74 | Temp 98.3°F | Ht 67.0 in | Wt 252.0 lb

## 2023-06-27 DIAGNOSIS — H538 Other visual disturbances: Secondary | ICD-10-CM

## 2023-06-27 DIAGNOSIS — Z23 Encounter for immunization: Secondary | ICD-10-CM | POA: Diagnosis not present

## 2023-06-27 DIAGNOSIS — N186 End stage renal disease: Secondary | ICD-10-CM

## 2023-06-27 DIAGNOSIS — G4733 Obstructive sleep apnea (adult) (pediatric): Secondary | ICD-10-CM | POA: Diagnosis not present

## 2023-06-27 DIAGNOSIS — I5042 Chronic combined systolic (congestive) and diastolic (congestive) heart failure: Secondary | ICD-10-CM | POA: Diagnosis not present

## 2023-06-27 DIAGNOSIS — Z1211 Encounter for screening for malignant neoplasm of colon: Secondary | ICD-10-CM | POA: Diagnosis not present

## 2023-06-27 DIAGNOSIS — I480 Paroxysmal atrial fibrillation: Secondary | ICD-10-CM | POA: Diagnosis not present

## 2023-06-27 DIAGNOSIS — R4589 Other symptoms and signs involving emotional state: Secondary | ICD-10-CM

## 2023-06-27 DIAGNOSIS — Z992 Dependence on renal dialysis: Secondary | ICD-10-CM

## 2023-06-27 MED ORDER — ETHYL CHLORIDE EX AERO
INHALATION_SPRAY | CUTANEOUS | 5 refills | Status: DC
  Filled 2023-06-27: qty 116, 30d supply, fill #0
  Filled 2023-07-25: qty 116, 30d supply, fill #1
  Filled 2023-08-24: qty 116, 30d supply, fill #2
  Filled 2023-09-26: qty 116, 30d supply, fill #3
  Filled 2023-10-27: qty 116, 30d supply, fill #4
  Filled 2023-11-02 – 2023-12-01 (×2): qty 116, 30d supply, fill #5

## 2023-06-27 MED ORDER — HYDROXYZINE HCL 25 MG PO TABS
75.0000 mg | ORAL_TABLET | Freq: Every evening | ORAL | 1 refills | Status: DC
Start: 2023-06-27 — End: 2023-12-28
  Filled 2023-06-27: qty 90, 30d supply, fill #0

## 2023-06-27 NOTE — Progress Notes (Signed)
Patient ID: Victor Thompson, male    DOB: [birth year]  MRN: [id number]  CC: Hypertension (HTN f/u. Med refills. /Pt discuss hydroxyzine. Valentino Hue to shingles vax.)   Subjective: Victor Thompson is a 62 y.o. male who presents for chronic ds management. His concerns today include:  Pt with hx of ESRD on hemodialysis (Dr. Marisue Humble), HTN, CAD (with hx of cardiac arrest), ICD deactivated 08/2022 due to VT storm with recurrent shocks, A.fib, combined sys/dia CHF (EF 20-25% 08/2022 and 10/2021), HL, OSA on CPAP, Obese/PreDM, COPD, chronic lower back pain with multilevel spondylosis.    Combine CHF/possible cardiac sarcoid/A.fib:  Has not seen D. McLean in a while.  Cardiology had tried to get him to [facility] for cardiac PET but this was denied by his insurance x 2. Patient deemed not to be a good LVAD candidate.  Wonders whether one of his heart med causes blurred vision.  Noticed blurred when he takes glasses off x 2-3 mths.  New glasses about 1 mth ago.  Has bifocals.  Trying to get use to reading with them.  Reports compliance with other medications including amiodarone 200 mg BID, Eliquis 5mg  BID, atorvastatin, carvedilol 12.5 mg BID, mexiletine 150 mg BID.  No CP/SOB.  Occasionally palpitations  ESRD:  on HD Tue/Thur/Sat  Anxiety:  Request higher dose of Hydroxyzine to take at bedtime.  When he lays down at night, he has difficulty falling asleep if he can hear his heart beating.  Very anxious about that.  .  OSA: saw Dr. Maple Hudson 02/2023.  Using CPAP consistently  HM:  Saw Dr. Fritzi Mandes.  Plan was for c-scope; pt states it was scheduled on a HD day.  So had to cancel.  Try to get them to schedule on nondialysis day but states he never heard back from them.  According to Dr. Lanetta Inch note, he was waiting to hear from Dr. Shirlee Latch on whether it was okay to use anesthesia. Patient Active Problem List   Diagnosis Date Noted   ICD (implantable cardioverter-defibrillator) in place 11/05/2022   Paroxysmal  atrial fibrillation (HCC)    Nausea and vomiting    Paroxysmal ventricular tachycardia (HCC) 08/24/2022   VT (ventricular tachycardia) (HCC) 08/16/2022   Chronic obstructive pulmonary disease, unspecified COPD type (HCC) 06/18/2022   AICD discharge 05/25/2022   Seasonal and perennial allergic rhinitis 11/12/2021   Moderate persistent asthma, uncomplicated 11/12/2021   STD exposure 08/27/2021   ESRD on dialysis (HCC) 02/06/2021   Hypervolemia associated with renal insufficiency 01/12/2021   Angioedema 09/22/2020   Secondary hyperparathyroidism of renal origin (HCC) 07/15/2019   Vitamin D deficiency 07/15/2019   CAD (coronary artery disease), native coronary artery 04/10/2019   Chronic midline low back pain without sciatica 02/17/2018   Ventricular tachycardia (HCC) 03/29/2016   Defibrillator discharge    HFrEF (heart failure with reduced ejection fraction) (HCC) 01/22/2016   Erectile dysfunction 01/22/2016   Allergic rhinitis 01/22/2016   GERD (gastroesophageal reflux disease) 01/22/2016   PROTEINURIA 02/17/2010   Dilated cardiomyopathy (HCC) 02/16/2010   Obstructive sleep apnea 02/16/2010   EXTERNAL HEMORRHOIDS 09/26/2009   Tobacco use disorder in remission 06/03/2009   Essential hypertension 10/01/2008   Hyperlipidemia 07/14/2007   Class 3 severe obesity due to excess calories with serious comorbidity and body mass index (BMI) of 40.0 to 44.9 in adult [facility]) 07/14/2007   Asthma 07/14/2007     Current Outpatient Medications on File Prior to Visit  Medication Sig Dispense Refill   albuterol (PROVENTIL) (2.5 MG/3ML) 0.083%  nebulizer solution INHALE THE CONTENTS OF 1 VIAL BY MOUTH EVERY 6 HOURS AS NEEDED 675 mL 0   albuterol (VENTOLIN HFA) 108 (90 Base) MCG/ACT inhaler INHALE TWO PUFFS BY MOUTH EVERY 4 HOURS AS NEEDED 60.3 g 2   amiodarone (PACERONE) 200 MG tablet Take 1 tablet (200 mg total) by mouth 2 (two) times daily. 60 tablet 2   apixaban (ELIQUIS) 5 MG TABS tablet NEW  PRESCRIPTION REQUEST: Eliquis 5 Mg - TAKE ONE TABLET BY MOUTH TWICE DAILY 180 tablet 3   atorvastatin (LIPITOR) 80 MG tablet Take 1 tablet (80 mg total) by mouth at bedtime. 90 tablet 3   budesonide-formoterol (SYMBICORT) 80-4.5 MCG/ACT inhaler INHALE TWO PUFFS BY MOUTH INTO THE LUNGS TWICE DAILY 10.2 g 0   calcium acetate (PHOSLO) 667 MG capsule Take 2 capsule by mouth three times a day with meals 180 capsule 6   carvedilol (COREG) 12.5 MG tablet Carvedilol 12.5 Mg- TAKE ONE TABLET BY MOUTH TWICE DAILY Do not take on dialysis days Tuesday Thursday Saturdays 180 tablet 3   Cholecalciferol 125 MCG (5000 UT) TABS Take 1 tablet (5,000 Units total) by mouth daily. 90 tablet 1   docusate sodium (COLACE) 100 MG capsule TAKE ONE CAPSULE BY MOUTH DAILY 90 capsule 0   EPINEPHrine 0.3 mg/0.3 mL IJ SOAJ injection Inject 0.3 mg into the muscle once as needed for up to 2 doses (if worsening tongue swelling, SOB, hypoxia, or other concerns for progressive anaphylaxis). 2 each 2   fluticasone (FLONASE) 50 MCG/ACT nasal spray INSTILL 1 SPRAY IN EACH NOSTRIL DAILY AS NEEDED 47.4 mL 0   lanthanum (FOSRENOL) 1000 MG chewable tablet Chew 2 tablets (2,000 mg total) by mouth 3 (three) times daily AND 1 tablet (1,000 mg total) 2 (two) times daily as needed with snacks 720 tablet 3   linaclotide (LINZESS) 72 MCG capsule TAKE 1 CAPSULE (72 MCG TOTAL) BY MOUTH EVERY MONDAY, WEDNESDAY, FRIDAY, AND SATURDAY. 64 capsule 1   mexiletine (MEXITIL) 150 MG capsule NEW PRESCRIPTION REQUEST: Mexiletine 150 Mg- TAKE TWO CAPSULES BY MOUTH TWICE DAILY 360 capsule 3   pantoprazole (PROTONIX) 40 MG tablet NEW PRESCRIPTION REQUEST: Pantoprazole Sod Dr 40 Mg - TAKE ONE TABLET BY MOUTH DAILY 90 tablet 3   PEG-KCl-NaCl-NaSulf-Na Asc-C (PLENVU) 140 g SOLR Take 1 kit by mouth as directed. 3 each 0   Polyethylene Glycol 3350 (PEG 3350) 17 g PACK NEW PRESCRIPTION REQUEST: Polyethylene Glycol 3350- 1 PACKET BY MOUTH DAILY AS NEEDED 90 packet 0    predniSONE (DELTASONE) 10 MG tablet TAKE ONE TABLET BY MOUTH ONCE DAILY WITH BREAKFAST 30 tablet 6   Rectal Protectant-Emollient (CALMOL-4) 76-10 % SUPP Use as directed once to twice daily 8 suppository 0   sildenafil (VIAGRA) 50 MG tablet Take 50 mg by mouth daily as needed.     tadalafil (CIALIS) 10 MG tablet Take 1 tablet (10 mg total) by mouth daily as needed 30 min prior to sexual activity.  Do not take with Imdur (isosorbide) or nitroglycerin 30 tablet 1   VELPHORO 500 MG chewable tablet Chew 1,000 mg by mouth 3 (three) times daily. (Patient not taking: Reported on 06/27/2023)     No current facility-administered medications on file prior to visit.    Allergies  Allergen Reactions   Ace Inhibitors Swelling    Swelling of the tongue   Influenza Vaccines Hives and Swelling    SWELLING REACTION UNSPECIFIED    Ketorolac Swelling    SWELLING REACTION UNSPECIFIED  Lidocaine Swelling    TONGUE SWELLS   Lisinopril Swelling    TONGUE SWELLING Pt reported problem with a BP med which sounded like  lisinopril  But as of 09/19/06,pt had tolerated altace without problem   Penicillins Swelling    TONGUE SWELLS Has patient had a PCN reaction causing immediate rash, facial/tongue/throat swelling, SOB or lightheadedness with hypotension: Yes Has patient had a PCN reaction causing severe rash involving mucus membranes or skin necrosis: No Has patient had a PCN reaction that required hospitalization No Has patient had a PCN reaction occurring within the last 10 years: Yes If all of the above answers are "NO", then may proceed with Cephalosporin use.     Social History   Socioeconomic History   Marital status: Single    Spouse name: Not on file   Number of children: 3   Years of education: Not on file   Highest education level: Not on file  Occupational History   Occupation: retired  Tobacco Use   Smoking status: Never   Smokeless tobacco: Never  Vaping Use   Vaping status: Never  Used  Substance and Sexual Activity   Alcohol use: No   Drug use: No   Sexual activity: Yes  Other Topics Concern   Not on file  Social History Narrative   ** Merged History Encounter **       Social Determinants of Health   Financial Resource Strain: Low Risk  (02/17/2023)   Overall Financial Resource Strain (CARDIA)    Difficulty of Paying Living Expenses: Not hard at all  Food Insecurity: No Food Insecurity (02/17/2023)   Hunger Vital Sign    Worried About Running Out of Food in the Last Year: Never true    Ran Out of Food in the Last Year: Never true  Transportation Needs: No Transportation Needs (02/17/2023)   PRAPARE - Administrator, Civil Service (Medical): No    Lack of Transportation (Non-Medical): No  Physical Activity: Inactive (02/17/2023)   Exercise Vital Sign    Days of Exercise per Week: 0 days    Minutes of Exercise per Session: 0 min  Stress: No Stress Concern Present (02/17/2023)   [employer] of Occupational Health - Occupational Stress Questionnaire    Feeling of Stress : Only a little  Social Connections: Not on file  Intimate Partner Violence: Not At Risk (08/28/2022)   Humiliation, Afraid, Rape, and Kick questionnaire    Fear of Current or Ex-Partner: No    Emotionally Abused: No    Physically Abused: No    Sexually Abused: No    Family History  Problem Relation Age of Onset   Hypertension Mother    Liver disease Father    Colon cancer Neg Hx    Esophageal cancer Neg Hx    Rectal cancer Neg Hx    Stomach cancer Neg Hx     Past Surgical History:  Procedure Laterality Date   AV FISTULA PLACEMENT Right 03/15/2019   Procedure: RIGHT ARM ARTERIOVENOUS (AV) FISTULA CREATION;  Surgeon: Chuck Hint, MD;  Location: [facility] OR;  Service: Vascular;  Laterality: Right;   COLONOSCOPY     COLONOSCOPY W/ BIOPSIES AND POLYPECTOMY     CORONARY STENT PLACEMENT  2007   IMPLANTABLE CARDIOVERTER DEFIBRILLATOR IMPLANT  2015   RIGHT/LEFT HEART  CATH AND CORONARY ANGIOGRAPHY N/A 07/08/2021   Procedure: RIGHT/LEFT HEART CATH AND CORONARY ANGIOGRAPHY;  Surgeon: Laurey Morale, MD;  Location: [facility] INVASIVE CV LAB;  Service:  Cardiovascular;  Laterality: N/A;   RIGHT/LEFT HEART CATH AND CORONARY ANGIOGRAPHY N/A 08/25/2022   Procedure: RIGHT/LEFT HEART CATH AND CORONARY ANGIOGRAPHY;  Surgeon: Laurey Morale, MD;  [facility] INVAS[facility] CV LAB;  Service: Cardiovascular;  Laterality: N/A;   SUBQ ICD CHANGEOUT N/A 10/13/2020   Procedure: SUBQ ICD CHANGEOUT;  Surgeon: Marinus Maw, MD;  Location: [facility] INVASIVE CV LAB;  Service: Cardiovascular;  Laterality: N/A;    ROS: Review of Systems Negative except as stated above  PHYSICAL EXAM: BP 114/77 (BP Location: Left Arm, Patient Position: Sitting, Cuff Size: Large)   Pulse 74   Temp 98.3 F (36.8 C) (Oral)   Ht 5\' 7"  (1.702 m)   Wt 252 lb (114.3 kg)   SpO2 94%   BMI 39.47 kg/m   Physical Exam   General appearance - alert, well appearing, and in no distress Mental status - normal mood, behavior, speech, dress, motor activity, and thought processes Neck - supple, no significant adenopathy Chest - clear to auscultation, no wheezes, rales or rhonchi, symmetric air entry Heart - normal rate, regular rhythm, normal S1, S2, no murmurs, rubs, clicks or gallops Extremities - peripheral pulses normal, no pedal edema, no clubbing or cyanosis     Latest Ref Rng & Units 01/19/2023   11:44 AM 10/26/2022    9:25 PM 09/29/2022    1:36 AM  CMP  Glucose 70 - 99 mg/dL  161  096   BUN 6 - 20 mg/dL  39  29   Creatinine 0.45 - 1.24 mg/dL  40.98  1.19   Sodium 147 - 145 mmol/L  145  143   Potassium 3.5 - 5.1 mmol/L  3.9  3.8   Chloride 98 - 111 mmol/L  98  97   CO2 22 - 32 mmol/L  27  29   Calcium 8.9 - 10.3 mg/dL  8.1  7.8   Total Protein 6.5 - 8.1 g/dL 6.4     Total Bilirubin 0.3 - 1.2 mg/dL 0.3     Alkaline Phos 38 - 126 U/L 72     AST 15 - 41 U/L 40     ALT 0 - 44 U/L 74      Lipid Panel      Component Value Date/Time   CHOL 230 (H) 08/16/2022 1004   TRIG 47 09/01/2022 0354   HDL 27 (L) 08/16/2022 1004   CHOLHDL 8.5 08/16/2022 1004   VLDL 39 08/16/2022 1004   LDLCALC 164 (H) 08/16/2022 1004    CBC    Component Value Date/Time   WBC 6.4 01/19/2023 1144   RBC 4.30 01/19/2023 1144   HGB 14.1 01/19/2023 1144   HGB 12.0 (L) 09/08/2021 1450   HCT 44.1 01/19/2023 1144   HCT 35.4 (L) 09/08/2021 1450   PLT 206 01/19/2023 1144   PLT 179 10/10/2020 0925   MCV 102.6 (H) 01/19/2023 1144   MCV 95 09/08/2021 1450   MCH 32.8 01/19/2023 1144   MCHC 32.0 01/19/2023 1144   RDW 16.7 (H) 01/19/2023 1144   RDW 14.2 09/08/2021 1450   LYMPHSABS 0.6 (L) 09/29/2022 0136   LYMPHSABS 0.8 09/08/2021 1450   MONOABS 0.8 09/29/2022 0136   EOSABS 0.0 09/29/2022 0136   EOSABS 0.3 09/08/2021 1450   BASOSABS 0.0 09/29/2022 0136   BASOSABS 0.0 09/08/2021 1450    ASSESSMENT AND PLAN:  1. Chronic combined systolic and diastolic CHF (congestive heart failure) (HCC) Stable and compensated.  Continue carvedilol.  Will arrange follow-up with his  cardiologist Dr. Shirlee Latch - Ambulatory referral to Cardiology  2. Anxiety about health Carefully increase the dose of hydroxyzine to 75 mg at night. - hydrOXYzine (ATARAX) 25 MG tablet; Take 3 tablets (75 mg total) by mouth at bedtime.  Dispense: 90 tablet; Refill: 1  3. ESRD on dialysis [facility]) Patient continued going to HD 3 times a week  4. PAF (paroxysmal atrial fibrillation) (HCC) Continue Eliquis, carvedilol, amiodarone Advised patient that amiodarone can cause blurred vision.  Recommend that he follow-up with the cardiologist to see whether dose adjustment can be made.  He recently had an eye exam a month ago. - Ambulatory referral to Cardiology  5. OSA on CPAP Encouraged him to continue using his CPAP consistently.  He reports benefit from use.  6. Screening for colon cancer I have sent a message to Dr. Adela Lank to determine whether  colonoscopy is still planned.  7. Need for shingles vaccine Given first Shingrix vaccine today.  Follow-up with nurse in 3 months for second shot.  8. Blurred vision See #3 above.    Patient was given the opportunity to ask questions.  Patient verbalized understanding of the plan and was able to repeat key elements of the plan.   This documentation was completed using Paediatric nurse.  Any transcriptional errors are unintentional.  Orders Placed This Encounter  Procedures   Ambulatory referral to Cardiology     Requested Prescriptions   Signed Prescriptions Disp Refills   ethyl chloride spray 116 mL 5    Sig: SPRAY A SMALL AMOUNT THREE TIMES A WEEK JUST PRIOR TO NEEDLE INSERTION   hydrOXYzine (ATARAX) 25 MG tablet 90 tablet 1    Sig: Take 3 tablets (75 mg total) by mouth at bedtime.    Return in about 4 months (around 10/28/2023) for Give appt with nurse in 3 mths fro 2nd Shingrix vaccine.  Jonah Blue, MD, FACP

## 2023-06-27 NOTE — Telephone Encounter (Signed)
-----   Message from Benancio Deeds sent at 06/27/2023  5:44 PM EDT ----- Hello, Thanks for reaching out. He was scheduled with [place] in April and cancelled. We were waiting to hear back from him to reschedule. He is on our list. We will contact him for scheduling. Unfortunately given limitations at the hospital for access we are booking out 3-4 months for hospital cases (given his co-morbidities we cannot do his case in the office). That being said, he is not too overdue for this and I think that is okay. I think it would be good for him to be reassessed by his cardiologist in the interim as it looks like he has not seen them for some time. If I recall he was awaiting a cardiac PET? Thanks.  Jan, FYI, let's try to get him in next opening in the fall. I think would be good for him to see his cardiologist in the interim.   Thanks ----- Message ----- From: [name], MD Sent: 06/27/2023   4:53 PM EDT To: Benancio Deeds, MD  I am the PCP for this patient.  You had seen him in March.  Plan was for colonoscopy.  You made mention in your note that you needed to touch base with his cardiologist Dr. Shirlee Latch to know if it is okay to use anesthesia.  Patient states he never heard back from your office.  It was scheduled but was on his dialysis days so it had to be canceled.

## 2023-06-28 DIAGNOSIS — D509 Iron deficiency anemia, unspecified: Secondary | ICD-10-CM | POA: Diagnosis not present

## 2023-06-28 DIAGNOSIS — L299 Pruritus, unspecified: Secondary | ICD-10-CM | POA: Diagnosis not present

## 2023-06-28 DIAGNOSIS — T7840XA Allergy, unspecified, initial encounter: Secondary | ICD-10-CM | POA: Diagnosis not present

## 2023-06-28 DIAGNOSIS — D631 Anemia in chronic kidney disease: Secondary | ICD-10-CM | POA: Diagnosis not present

## 2023-06-28 DIAGNOSIS — T782XXA Anaphylactic shock, unspecified, initial encounter: Secondary | ICD-10-CM | POA: Diagnosis not present

## 2023-06-28 DIAGNOSIS — N2581 Secondary hyperparathyroidism of renal origin: Secondary | ICD-10-CM | POA: Diagnosis not present

## 2023-06-28 DIAGNOSIS — N186 End stage renal disease: Secondary | ICD-10-CM | POA: Diagnosis not present

## 2023-06-28 DIAGNOSIS — Z992 Dependence on renal dialysis: Secondary | ICD-10-CM | POA: Diagnosis not present

## 2023-06-29 ENCOUNTER — Ambulatory Visit: Payer: Self-pay

## 2023-06-29 NOTE — Patient Outreach (Signed)
  Care Coordination   06/29/2023 Name: GRAYER SPROLES MRN: [id number] DOB: 11-01-1961   Care Coordination Outreach Attempts:  An unsuccessful telephone outreach was attempted for a scheduled appointment today.  Follow Up Plan:  Additional outreach attempts will be made to offer the patient care coordination information and services.   Encounter Outcome:  Pt. Request to Call Back   Care Coordination Interventions:  No, not indicated    Delsa Sale, RN, BSN, CCM Care Management Coordinator [facility] Care Management Direct Phone: [phone]

## 2023-06-30 ENCOUNTER — Telehealth: Payer: Self-pay

## 2023-06-30 ENCOUNTER — Other Ambulatory Visit: Payer: Self-pay

## 2023-06-30 DIAGNOSIS — N186 End stage renal disease: Secondary | ICD-10-CM | POA: Diagnosis not present

## 2023-06-30 DIAGNOSIS — N2581 Secondary hyperparathyroidism of renal origin: Secondary | ICD-10-CM | POA: Diagnosis not present

## 2023-06-30 DIAGNOSIS — Z992 Dependence on renal dialysis: Secondary | ICD-10-CM | POA: Diagnosis not present

## 2023-06-30 DIAGNOSIS — T7840XA Allergy, unspecified, initial encounter: Secondary | ICD-10-CM | POA: Diagnosis not present

## 2023-06-30 DIAGNOSIS — D631 Anemia in chronic kidney disease: Secondary | ICD-10-CM | POA: Diagnosis not present

## 2023-06-30 DIAGNOSIS — D509 Iron deficiency anemia, unspecified: Secondary | ICD-10-CM | POA: Diagnosis not present

## 2023-06-30 DIAGNOSIS — L299 Pruritus, unspecified: Secondary | ICD-10-CM | POA: Diagnosis not present

## 2023-06-30 DIAGNOSIS — T782XXA Anaphylactic shock, unspecified, initial encounter: Secondary | ICD-10-CM | POA: Diagnosis not present

## 2023-06-30 NOTE — Telephone Encounter (Signed)
Patient scheduled for OV on 9-27 at 3:20pm. MyChart message to patient

## 2023-06-30 NOTE — Telephone Encounter (Signed)
-----   Message from Benancio Deeds sent at 06/29/2023  5:47 PM EDT ----- Vanessa Kick I reviewed his chart.  He is extremely high risk, I am not sure if it is even worth the risks of doing a procedure for him at this point.  I think it is best that I see him in the office and we discussed his options.  Can you book him an office visit with me?  I need to look at the calendar to see when I can fit him in next and we can discuss tomorrow.  Thanks ----- Message ----- From: Cooper Render, CMA Sent: 06/28/2023   8:50 AM EDT To: Benancio Deeds, MD  You have an opening for a colon at [facility] on 10-28. Please confirm he can be done at [facility].  Once we pick a date I can request clearance from Cardiology. Thanks,  Jan ----- Message ----- From: Benancio Deeds, MD Sent: 06/27/2023   5:47 PM EDT To: [name], MD; Cooper Render, CMA  Hello, Thanks for reaching out. He was scheduled with [place] in April and cancelled. We were waiting to hear back from him to reschedule. He is on our list. We will contact him for scheduling. Unfortunately given limitations at the hospital for access we are booking out 3-4 months for hospital cases (given his co-morbidities we cannot do his case in the office). That being said, he is not too overdue for this and I think that is okay. I think it would be good for him to be reassessed by his cardiologist in the interim as it looks like he has not seen them for some time. If I recall he was awaiting a cardiac PET? Thanks.  Jan, FYI, let's try to get him in next opening in the fall. I think would be good for him to see his cardiologist in the interim.   Thanks ----- Message ----- From: [name], MD Sent: 06/27/2023   4:53 PM EDT To: Benancio Deeds, MD  I am the PCP for this patient.  You had seen him in March.  Plan was for colonoscopy.  You made mention in your note that you needed to touch base with his cardiologist Dr. Shirlee Latch to know if it is okay to  use anesthesia.  Patient states he never heard back from your office.  It was scheduled but was on his dialysis days so it had to be canceled.

## 2023-07-02 DIAGNOSIS — T782XXA Anaphylactic shock, unspecified, initial encounter: Secondary | ICD-10-CM | POA: Diagnosis not present

## 2023-07-02 DIAGNOSIS — N186 End stage renal disease: Secondary | ICD-10-CM | POA: Diagnosis not present

## 2023-07-02 DIAGNOSIS — N2581 Secondary hyperparathyroidism of renal origin: Secondary | ICD-10-CM | POA: Diagnosis not present

## 2023-07-02 DIAGNOSIS — T7840XA Allergy, unspecified, initial encounter: Secondary | ICD-10-CM | POA: Diagnosis not present

## 2023-07-02 DIAGNOSIS — L299 Pruritus, unspecified: Secondary | ICD-10-CM | POA: Diagnosis not present

## 2023-07-02 DIAGNOSIS — Z992 Dependence on renal dialysis: Secondary | ICD-10-CM | POA: Diagnosis not present

## 2023-07-02 DIAGNOSIS — D509 Iron deficiency anemia, unspecified: Secondary | ICD-10-CM | POA: Diagnosis not present

## 2023-07-02 DIAGNOSIS — D631 Anemia in chronic kidney disease: Secondary | ICD-10-CM | POA: Diagnosis not present

## 2023-07-05 DIAGNOSIS — D631 Anemia in chronic kidney disease: Secondary | ICD-10-CM | POA: Diagnosis not present

## 2023-07-05 DIAGNOSIS — N186 End stage renal disease: Secondary | ICD-10-CM | POA: Diagnosis not present

## 2023-07-05 DIAGNOSIS — T7840XA Allergy, unspecified, initial encounter: Secondary | ICD-10-CM | POA: Diagnosis not present

## 2023-07-05 DIAGNOSIS — D509 Iron deficiency anemia, unspecified: Secondary | ICD-10-CM | POA: Diagnosis not present

## 2023-07-05 DIAGNOSIS — Z992 Dependence on renal dialysis: Secondary | ICD-10-CM | POA: Diagnosis not present

## 2023-07-05 DIAGNOSIS — L299 Pruritus, unspecified: Secondary | ICD-10-CM | POA: Diagnosis not present

## 2023-07-05 DIAGNOSIS — N2581 Secondary hyperparathyroidism of renal origin: Secondary | ICD-10-CM | POA: Diagnosis not present

## 2023-07-05 DIAGNOSIS — T782XXA Anaphylactic shock, unspecified, initial encounter: Secondary | ICD-10-CM | POA: Diagnosis not present

## 2023-07-06 DIAGNOSIS — Z992 Dependence on renal dialysis: Secondary | ICD-10-CM | POA: Diagnosis not present

## 2023-07-06 DIAGNOSIS — N186 End stage renal disease: Secondary | ICD-10-CM | POA: Diagnosis not present

## 2023-07-06 DIAGNOSIS — I129 Hypertensive chronic kidney disease with stage 1 through stage 4 chronic kidney disease, or unspecified chronic kidney disease: Secondary | ICD-10-CM | POA: Diagnosis not present

## 2023-07-07 DIAGNOSIS — D631 Anemia in chronic kidney disease: Secondary | ICD-10-CM | POA: Diagnosis not present

## 2023-07-07 DIAGNOSIS — R52 Pain, unspecified: Secondary | ICD-10-CM | POA: Diagnosis not present

## 2023-07-07 DIAGNOSIS — T782XXA Anaphylactic shock, unspecified, initial encounter: Secondary | ICD-10-CM | POA: Diagnosis not present

## 2023-07-07 DIAGNOSIS — N186 End stage renal disease: Secondary | ICD-10-CM | POA: Diagnosis not present

## 2023-07-07 DIAGNOSIS — N2581 Secondary hyperparathyroidism of renal origin: Secondary | ICD-10-CM | POA: Diagnosis not present

## 2023-07-07 DIAGNOSIS — L299 Pruritus, unspecified: Secondary | ICD-10-CM | POA: Diagnosis not present

## 2023-07-07 DIAGNOSIS — Z992 Dependence on renal dialysis: Secondary | ICD-10-CM | POA: Diagnosis not present

## 2023-07-07 DIAGNOSIS — T7840XA Allergy, unspecified, initial encounter: Secondary | ICD-10-CM | POA: Diagnosis not present

## 2023-07-07 DIAGNOSIS — D509 Iron deficiency anemia, unspecified: Secondary | ICD-10-CM | POA: Diagnosis not present

## 2023-07-09 DIAGNOSIS — N2581 Secondary hyperparathyroidism of renal origin: Secondary | ICD-10-CM | POA: Diagnosis not present

## 2023-07-09 DIAGNOSIS — T782XXA Anaphylactic shock, unspecified, initial encounter: Secondary | ICD-10-CM | POA: Diagnosis not present

## 2023-07-09 DIAGNOSIS — D631 Anemia in chronic kidney disease: Secondary | ICD-10-CM | POA: Diagnosis not present

## 2023-07-09 DIAGNOSIS — N186 End stage renal disease: Secondary | ICD-10-CM | POA: Diagnosis not present

## 2023-07-09 DIAGNOSIS — R52 Pain, unspecified: Secondary | ICD-10-CM | POA: Diagnosis not present

## 2023-07-09 DIAGNOSIS — T7840XA Allergy, unspecified, initial encounter: Secondary | ICD-10-CM | POA: Diagnosis not present

## 2023-07-09 DIAGNOSIS — D509 Iron deficiency anemia, unspecified: Secondary | ICD-10-CM | POA: Diagnosis not present

## 2023-07-09 DIAGNOSIS — Z992 Dependence on renal dialysis: Secondary | ICD-10-CM | POA: Diagnosis not present

## 2023-07-09 DIAGNOSIS — L299 Pruritus, unspecified: Secondary | ICD-10-CM | POA: Diagnosis not present

## 2023-07-12 DIAGNOSIS — Z992 Dependence on renal dialysis: Secondary | ICD-10-CM | POA: Diagnosis not present

## 2023-07-12 DIAGNOSIS — T7840XA Allergy, unspecified, initial encounter: Secondary | ICD-10-CM | POA: Diagnosis not present

## 2023-07-12 DIAGNOSIS — D509 Iron deficiency anemia, unspecified: Secondary | ICD-10-CM | POA: Diagnosis not present

## 2023-07-12 DIAGNOSIS — T782XXA Anaphylactic shock, unspecified, initial encounter: Secondary | ICD-10-CM | POA: Diagnosis not present

## 2023-07-12 DIAGNOSIS — L299 Pruritus, unspecified: Secondary | ICD-10-CM | POA: Diagnosis not present

## 2023-07-12 DIAGNOSIS — N186 End stage renal disease: Secondary | ICD-10-CM | POA: Diagnosis not present

## 2023-07-12 DIAGNOSIS — R52 Pain, unspecified: Secondary | ICD-10-CM | POA: Diagnosis not present

## 2023-07-12 DIAGNOSIS — D631 Anemia in chronic kidney disease: Secondary | ICD-10-CM | POA: Diagnosis not present

## 2023-07-12 DIAGNOSIS — N2581 Secondary hyperparathyroidism of renal origin: Secondary | ICD-10-CM | POA: Diagnosis not present

## 2023-07-14 DIAGNOSIS — T7840XA Allergy, unspecified, initial encounter: Secondary | ICD-10-CM | POA: Diagnosis not present

## 2023-07-14 DIAGNOSIS — R52 Pain, unspecified: Secondary | ICD-10-CM | POA: Diagnosis not present

## 2023-07-14 DIAGNOSIS — Z992 Dependence on renal dialysis: Secondary | ICD-10-CM | POA: Diagnosis not present

## 2023-07-14 DIAGNOSIS — D631 Anemia in chronic kidney disease: Secondary | ICD-10-CM | POA: Diagnosis not present

## 2023-07-14 DIAGNOSIS — L299 Pruritus, unspecified: Secondary | ICD-10-CM | POA: Diagnosis not present

## 2023-07-14 DIAGNOSIS — N186 End stage renal disease: Secondary | ICD-10-CM | POA: Diagnosis not present

## 2023-07-14 DIAGNOSIS — D509 Iron deficiency anemia, unspecified: Secondary | ICD-10-CM | POA: Diagnosis not present

## 2023-07-14 DIAGNOSIS — T782XXA Anaphylactic shock, unspecified, initial encounter: Secondary | ICD-10-CM | POA: Diagnosis not present

## 2023-07-14 DIAGNOSIS — N2581 Secondary hyperparathyroidism of renal origin: Secondary | ICD-10-CM | POA: Diagnosis not present

## 2023-07-15 ENCOUNTER — Other Ambulatory Visit (HOSPITAL_COMMUNITY): Payer: Self-pay | Admitting: Cardiology

## 2023-07-15 ENCOUNTER — Telehealth: Payer: Self-pay | Admitting: *Deleted

## 2023-07-15 NOTE — Progress Notes (Signed)
  Care Coordination Note  07/15/2023 Name: Victor Thompson MRN: [id number] DOB: 1960-12-16  Victor Thompson is a 62 y.o. year old male who is a primary care patient of [name], MD and is actively engaged with the care management team. I reached out to [name] by phone today to assist with re-scheduling a follow up visit with the RN Case Manager  Follow up plan: Unsuccessful telephone outreach attempt made. A HIPAA compliant phone message was left for the patient providing contact information and requesting a return call.   [facility]  Care Coordination Care Guide  Direct Dial: [phone]

## 2023-07-15 NOTE — Progress Notes (Signed)
  Care Coordination Note  07/15/2023 Name: Victor Thompson MRN: [id number] DOB: 11/10/1961  Victor Thompson is a 62 y.o. year old male who is a primary care patient of [name], MD and is actively engaged with the care management team. I reached out to [name] by phone today to assist with re-scheduling a follow up visit with the RN Case Manager  Follow up plan: Telephone appointment with care management team member scheduled for:07/28/23  [facility] Coordination Care Guide  Direct Dial: [phone]

## 2023-07-16 DIAGNOSIS — Z992 Dependence on renal dialysis: Secondary | ICD-10-CM | POA: Diagnosis not present

## 2023-07-16 DIAGNOSIS — T782XXA Anaphylactic shock, unspecified, initial encounter: Secondary | ICD-10-CM | POA: Diagnosis not present

## 2023-07-16 DIAGNOSIS — D509 Iron deficiency anemia, unspecified: Secondary | ICD-10-CM | POA: Diagnosis not present

## 2023-07-16 DIAGNOSIS — T7840XA Allergy, unspecified, initial encounter: Secondary | ICD-10-CM | POA: Diagnosis not present

## 2023-07-16 DIAGNOSIS — D631 Anemia in chronic kidney disease: Secondary | ICD-10-CM | POA: Diagnosis not present

## 2023-07-16 DIAGNOSIS — N186 End stage renal disease: Secondary | ICD-10-CM | POA: Diagnosis not present

## 2023-07-16 DIAGNOSIS — N2581 Secondary hyperparathyroidism of renal origin: Secondary | ICD-10-CM | POA: Diagnosis not present

## 2023-07-16 DIAGNOSIS — R52 Pain, unspecified: Secondary | ICD-10-CM | POA: Diagnosis not present

## 2023-07-16 DIAGNOSIS — L299 Pruritus, unspecified: Secondary | ICD-10-CM | POA: Diagnosis not present

## 2023-07-19 DIAGNOSIS — T782XXA Anaphylactic shock, unspecified, initial encounter: Secondary | ICD-10-CM | POA: Diagnosis not present

## 2023-07-19 DIAGNOSIS — Z992 Dependence on renal dialysis: Secondary | ICD-10-CM | POA: Diagnosis not present

## 2023-07-19 DIAGNOSIS — N186 End stage renal disease: Secondary | ICD-10-CM | POA: Diagnosis not present

## 2023-07-19 DIAGNOSIS — R52 Pain, unspecified: Secondary | ICD-10-CM | POA: Diagnosis not present

## 2023-07-19 DIAGNOSIS — D509 Iron deficiency anemia, unspecified: Secondary | ICD-10-CM | POA: Diagnosis not present

## 2023-07-19 DIAGNOSIS — D631 Anemia in chronic kidney disease: Secondary | ICD-10-CM | POA: Diagnosis not present

## 2023-07-19 DIAGNOSIS — L299 Pruritus, unspecified: Secondary | ICD-10-CM | POA: Diagnosis not present

## 2023-07-19 DIAGNOSIS — T7840XA Allergy, unspecified, initial encounter: Secondary | ICD-10-CM | POA: Diagnosis not present

## 2023-07-19 DIAGNOSIS — N2581 Secondary hyperparathyroidism of renal origin: Secondary | ICD-10-CM | POA: Diagnosis not present

## 2023-07-21 DIAGNOSIS — D631 Anemia in chronic kidney disease: Secondary | ICD-10-CM | POA: Diagnosis not present

## 2023-07-21 DIAGNOSIS — T7840XA Allergy, unspecified, initial encounter: Secondary | ICD-10-CM | POA: Diagnosis not present

## 2023-07-21 DIAGNOSIS — N2581 Secondary hyperparathyroidism of renal origin: Secondary | ICD-10-CM | POA: Diagnosis not present

## 2023-07-21 DIAGNOSIS — T782XXA Anaphylactic shock, unspecified, initial encounter: Secondary | ICD-10-CM | POA: Diagnosis not present

## 2023-07-21 DIAGNOSIS — N186 End stage renal disease: Secondary | ICD-10-CM | POA: Diagnosis not present

## 2023-07-21 DIAGNOSIS — L299 Pruritus, unspecified: Secondary | ICD-10-CM | POA: Diagnosis not present

## 2023-07-21 DIAGNOSIS — D509 Iron deficiency anemia, unspecified: Secondary | ICD-10-CM | POA: Diagnosis not present

## 2023-07-21 DIAGNOSIS — R52 Pain, unspecified: Secondary | ICD-10-CM | POA: Diagnosis not present

## 2023-07-21 DIAGNOSIS — Z992 Dependence on renal dialysis: Secondary | ICD-10-CM | POA: Diagnosis not present

## 2023-07-23 DIAGNOSIS — T7840XA Allergy, unspecified, initial encounter: Secondary | ICD-10-CM | POA: Diagnosis not present

## 2023-07-23 DIAGNOSIS — T782XXA Anaphylactic shock, unspecified, initial encounter: Secondary | ICD-10-CM | POA: Diagnosis not present

## 2023-07-23 DIAGNOSIS — G4733 Obstructive sleep apnea (adult) (pediatric): Secondary | ICD-10-CM | POA: Diagnosis not present

## 2023-07-23 DIAGNOSIS — N2581 Secondary hyperparathyroidism of renal origin: Secondary | ICD-10-CM | POA: Diagnosis not present

## 2023-07-23 DIAGNOSIS — D509 Iron deficiency anemia, unspecified: Secondary | ICD-10-CM | POA: Diagnosis not present

## 2023-07-23 DIAGNOSIS — L299 Pruritus, unspecified: Secondary | ICD-10-CM | POA: Diagnosis not present

## 2023-07-23 DIAGNOSIS — Z992 Dependence on renal dialysis: Secondary | ICD-10-CM | POA: Diagnosis not present

## 2023-07-23 DIAGNOSIS — R52 Pain, unspecified: Secondary | ICD-10-CM | POA: Diagnosis not present

## 2023-07-23 DIAGNOSIS — N186 End stage renal disease: Secondary | ICD-10-CM | POA: Diagnosis not present

## 2023-07-23 DIAGNOSIS — D631 Anemia in chronic kidney disease: Secondary | ICD-10-CM | POA: Diagnosis not present

## 2023-07-25 ENCOUNTER — Other Ambulatory Visit: Payer: Self-pay | Admitting: Internal Medicine

## 2023-07-25 ENCOUNTER — Other Ambulatory Visit: Payer: Self-pay

## 2023-07-25 ENCOUNTER — Telehealth: Payer: Self-pay | Admitting: Gastroenterology

## 2023-07-25 DIAGNOSIS — K5903 Drug induced constipation: Secondary | ICD-10-CM

## 2023-07-25 NOTE — Telephone Encounter (Signed)
Pharmacy rep called to advise the Plenvu was not covered by insurance however they are able to approve Gavilyte, Generic Suprep or Sutab. Please advise.

## 2023-07-25 NOTE — Telephone Encounter (Signed)
Medication Refill - Medication: fluticasone (FLONASE) 50 MCG/ACT nasal spray  docusate sodium (COLACE) 100 MG capsule albuterol (PROVENTIL) (2.5 MG/3ML) 0.083% nebulizer solution    Has the patient contacted their pharmacy? Yes.   Victor Thompson called to request refill  Preferred Pharmacy (with phone number or street name): [facility] - [address] [place], [place] - [address]  Phone: [phone] Fax: [phone]  Has the patient been seen for an appointment in the last year OR does the patient have an upcoming appointment? Yes.    Agent: Please be advised that RX refills may take up to 3 business days. We ask that you follow-up with your pharmacy.

## 2023-07-26 DIAGNOSIS — T782XXA Anaphylactic shock, unspecified, initial encounter: Secondary | ICD-10-CM | POA: Diagnosis not present

## 2023-07-26 DIAGNOSIS — L299 Pruritus, unspecified: Secondary | ICD-10-CM | POA: Diagnosis not present

## 2023-07-26 DIAGNOSIS — D631 Anemia in chronic kidney disease: Secondary | ICD-10-CM | POA: Diagnosis not present

## 2023-07-26 DIAGNOSIS — T7840XA Allergy, unspecified, initial encounter: Secondary | ICD-10-CM | POA: Diagnosis not present

## 2023-07-26 DIAGNOSIS — R52 Pain, unspecified: Secondary | ICD-10-CM | POA: Diagnosis not present

## 2023-07-26 DIAGNOSIS — N2581 Secondary hyperparathyroidism of renal origin: Secondary | ICD-10-CM | POA: Diagnosis not present

## 2023-07-26 DIAGNOSIS — Z992 Dependence on renal dialysis: Secondary | ICD-10-CM | POA: Diagnosis not present

## 2023-07-26 DIAGNOSIS — D509 Iron deficiency anemia, unspecified: Secondary | ICD-10-CM | POA: Diagnosis not present

## 2023-07-26 DIAGNOSIS — N186 End stage renal disease: Secondary | ICD-10-CM | POA: Diagnosis not present

## 2023-07-26 NOTE — Telephone Encounter (Signed)
Medicare coupon codes sent to pharmacy [facility] fax: [phone]. Ph: [phone]

## 2023-07-26 NOTE — Telephone Encounter (Signed)
Patient has upcoming appointment with Dr. Adela Lank to discuss colonoscopy. Can discuss prep at that time if they decide to proceed with colonoscopy procedure.

## 2023-07-27 ENCOUNTER — Other Ambulatory Visit: Payer: Self-pay | Admitting: Cardiology

## 2023-07-27 ENCOUNTER — Ambulatory Visit: Payer: 59 | Attending: Physician Assistant | Admitting: Physician Assistant

## 2023-07-27 VITALS — BP 105/77 | HR 71 | Ht 67.0 in | Wt 255.8 lb

## 2023-07-27 DIAGNOSIS — I10 Essential (primary) hypertension: Secondary | ICD-10-CM

## 2023-07-27 DIAGNOSIS — I5042 Chronic combined systolic (congestive) and diastolic (congestive) heart failure: Secondary | ICD-10-CM

## 2023-07-27 DIAGNOSIS — N186 End stage renal disease: Secondary | ICD-10-CM

## 2023-07-27 MED ORDER — ALBUTEROL SULFATE (2.5 MG/3ML) 0.083% IN NEBU: INHALATION_SOLUTION | RESPIRATORY_TRACT | 1 refills | Status: DC

## 2023-07-27 MED ORDER — DOCUSATE SODIUM 100 MG PO CAPS
ORAL_CAPSULE | ORAL | 1 refills | Status: DC
Start: 2023-07-27 — End: 2023-12-28

## 2023-07-27 MED ORDER — FLUTICASONE PROPIONATE 50 MCG/ACT NA SUSP: NASAL | 1 refills | Status: DC

## 2023-07-27 NOTE — Telephone Encounter (Signed)
Requested Prescriptions  Pending Prescriptions Disp Refills   fluticasone (FLONASE) 50 MCG/ACT nasal spray 47.4 mL 1    Sig: INSTILL 1 SPRAY IN EACH NOSTRIL DAILY AS NEEDED     Ear, Nose, and Throat: Nasal Preparations - Corticosteroids Passed - 07/25/2023  2:59 PM      Passed - Valid encounter within last 12 months    Recent Outpatient Visits           1 month ago Chronic combined systolic and diastolic CHF (congestive heart failure) (HCC)   [facility] Jonah Blue B, MD   5 months ago Chronic combined systolic and diastolic CHF (congestive heart failure) [facility])   [facility] Jonah Blue B, MD   9 months ago Chronic systolic congestive heart failure, NYHA class 2 [facility])   [facility] [name], MD   10 months ago Hospital discharge follow-up   [facility] [name], MD   11 months ago Anxiety about health   [place], [place]andra Knud[place], [place]       Future Appointments             Today Sharon Seller, [place] [facility]   In 3 months Laural Benes, Binnie Rail, MD [facility]             docusate sodium (COLACE) 100 MG capsule 90 capsule 1    Sig: TAKE ONE CAPSULE BY MOUTH DAILY     Over the Counter:  OTC Passed - 07/25/2023  2:59 PM      Passed - Valid encounter within last 12 months    Recent Outpatient Visits           1 month ago Chronic combined systolic and diastolic CHF (congestive heart failure) (HCC)   [facility] Jonah Blue B, MD   5 months ago Chronic combined systolic and diastolic CHF (congestive heart failure) [facility])   [facility] Jonah Blue B, MD   9 months ago Chronic systolic congestive heart failure, NYHA class 2 (HCC)    [facility] [name], MD   10 months ago Hospital discharge follow-up   [facility] [name], MD   11 months ago Anxiety about health   [place] [facility], [place], [place]       Future Appointments             Today Sharon Seller, [place] [facility]   In 3 months [name], MD [facility]             albuterol (PROVENTIL) (2.5 MG/3ML) 0.083% nebulizer solution 675 mL 1    Sig: INHALE THE CONTENTS OF 1 VIAL BY MOUTH EVERY 6 HOURS AS NEEDED     Pulmonology:  Beta Agonists 2 Passed - 07/25/2023  2:59 PM      Passed - Last BP in normal range    BP Readings from Last 1 Encounters:  06/27/23 114/77         Passed - Last Heart Rate in normal range    Pulse Readings from Last 1 Encounters:  06/27/23 74         Passed - Valid encounter within last 12 months    Recent Outpatient Visits           1 month ago Chronic combined systolic and diastolic CHF (congestive heart failure) (HCC)   [facility] Jonah Blue B, MD   5 months ago Chronic combined systolic and diastolic CHF (congestive heart failure) [facility])   [facility] Jonah Blue B, MD   9 months ago Chronic systolic congestive heart failure, NYHA class 2 [facility])   [facility] [name], MD   10 months ago Hospital discharge follow-up   [facility] [name], MD   11 months ago Anxiety about health   [facility], [place], [place]       Future Appointments             Today Sharon Seller, [place] [facility]   In 3 months Laural Benes, Binnie Rail, MD [facility]

## 2023-07-28 ENCOUNTER — Ambulatory Visit: Payer: Self-pay

## 2023-07-28 DIAGNOSIS — N2581 Secondary hyperparathyroidism of renal origin: Secondary | ICD-10-CM | POA: Diagnosis not present

## 2023-07-28 DIAGNOSIS — N186 End stage renal disease: Secondary | ICD-10-CM | POA: Diagnosis not present

## 2023-07-28 DIAGNOSIS — T7840XA Allergy, unspecified, initial encounter: Secondary | ICD-10-CM | POA: Diagnosis not present

## 2023-07-28 DIAGNOSIS — T782XXA Anaphylactic shock, unspecified, initial encounter: Secondary | ICD-10-CM | POA: Diagnosis not present

## 2023-07-28 DIAGNOSIS — D631 Anemia in chronic kidney disease: Secondary | ICD-10-CM | POA: Diagnosis not present

## 2023-07-28 DIAGNOSIS — L299 Pruritus, unspecified: Secondary | ICD-10-CM | POA: Diagnosis not present

## 2023-07-28 DIAGNOSIS — R52 Pain, unspecified: Secondary | ICD-10-CM | POA: Diagnosis not present

## 2023-07-28 DIAGNOSIS — Z992 Dependence on renal dialysis: Secondary | ICD-10-CM | POA: Diagnosis not present

## 2023-07-28 DIAGNOSIS — D509 Iron deficiency anemia, unspecified: Secondary | ICD-10-CM | POA: Diagnosis not present

## 2023-07-28 NOTE — Patient Outreach (Signed)
  Care Coordination   07/28/2023 Name: Victor Thompson MRN: [id number] DOB: 10/17/1961   Care Coordination Outreach Attempts:  An unsuccessful telephone outreach was attempted for a scheduled appointment today.  Follow Up Plan:  Additional outreach attempts will be made to offer the patient care coordination information and services.   Encounter Outcome:  No Answer   Care Coordination Interventions:  No, not indicated    Delsa Sale, RN, BSN, CCM Care Management Coordinator [facility] Care Management  Direct Phone: [phone]

## 2023-07-30 DIAGNOSIS — T782XXA Anaphylactic shock, unspecified, initial encounter: Secondary | ICD-10-CM | POA: Diagnosis not present

## 2023-07-30 DIAGNOSIS — N186 End stage renal disease: Secondary | ICD-10-CM | POA: Diagnosis not present

## 2023-07-30 DIAGNOSIS — D509 Iron deficiency anemia, unspecified: Secondary | ICD-10-CM | POA: Diagnosis not present

## 2023-07-30 DIAGNOSIS — Z992 Dependence on renal dialysis: Secondary | ICD-10-CM | POA: Diagnosis not present

## 2023-07-30 DIAGNOSIS — L299 Pruritus, unspecified: Secondary | ICD-10-CM | POA: Diagnosis not present

## 2023-07-30 DIAGNOSIS — D631 Anemia in chronic kidney disease: Secondary | ICD-10-CM | POA: Diagnosis not present

## 2023-07-30 DIAGNOSIS — N2581 Secondary hyperparathyroidism of renal origin: Secondary | ICD-10-CM | POA: Diagnosis not present

## 2023-07-30 DIAGNOSIS — T7840XA Allergy, unspecified, initial encounter: Secondary | ICD-10-CM | POA: Diagnosis not present

## 2023-07-30 DIAGNOSIS — R52 Pain, unspecified: Secondary | ICD-10-CM | POA: Diagnosis not present

## 2023-08-01 NOTE — Progress Notes (Signed)
PCP: [name], MD Cardiology: Dr. Mayford Knife EP: Dr. Johney Frame HF cardiology: Dr. Shirlee Latch  62 y.o. with history of CAD, ischemic CMP, and ESRD was referred by Dr. Mayford Knife for evaluation of CHF.  He had PCI in 2005, uncertain what vessel was involved.  He has not had coronary angiography since that time due to advanced CKD.  He has had ischemic cardiomyopathy x years.  Echo in 2/22 showed EF 25-30% with moderate LV dilation and normal RV.  He had VT arrest in 2015, has St Jude subcutaneous ICD.  [name] in 2/22.  LHC/RHC was done in 8/22, showing no significant CAD and stable hemodynamics.   Echo 11/22 EF 25-30%, moderate LV dilation, diffuse hypokinesis, normal RV.   Patient had ICD shock in 6/23 while at HD => VT storm.  He was noted to be volume overloaded.  He was started on amiodarone.    Admitted 9/23 with VT storm. Required intubation with multiple amiodarone boluses. Echo showed EF 20-25%, GIIIDD (restrictive), RV normal. R/LHC showed nonobstructive CAD and elevated right and left filling pressures. Underwent CRRT for volume removal. EP consulted and started on procainamide. Started on prednisone for suspected sarcoid. Mexiletine later added for VT suppression. Able to wean pressors off and transition to iHD for volume removal. Decided on DNR, and ICD deactivated.  Hospitalization c/b PNA and paroxysmal atrial fibrillation. Plan to have cardiac PET at [facility] for cardiac sarcoidosis work up.  Follow up 12/23, NYHA II-early III and volume stable. Low dose BiDil added on non-HD days.   He returns for followup of CHF and VT.  His ICD remains inactivated.  He has been to the ER a couple of times with palpitations but ECGs have shown NSR. He is taking prednisone for possible sarcoidosis, Dr. Ladona Ridgel recently decreased to 20 mg daily. He still has not heard from [facility] regarding the scheduling of cardiac PET. He is short of breath walking 50-75 yards. He is able to walk up a flight of stairs.  No syncope.  Generally no lightheadedness though BP gets low at times at HD.   Today he returns for HF follow up. Overall feeling fine. He is not SOB walking on falt ground, takes his time walking up steps but generally does not have significant dyspnea with this. Main issue is low BP at HD yesterday, causing him to pass out. He received IVF and recovered with improved BP. He has been taking his BP-active medications every day (previously instructed to hold on HD days). Has BRBpR after straining to have a BM. Denies palpitations, CP,  edema, or PND/Orthopnea. Appetite ok. No fever or chills. Weight stable. Insurance denied cardiac PET x 2. ICD remains deactivated. Saw Dr. Ladona Ridgel recently, continued on amiodarone 200 mg bid.   ECG (personally reviewed): NSR, LBBB QRS 164 msec  Labs (6/22): LDL 75 Labs (8/22): hgb 13.9 Labs (10/23): K 4.2, mag 2, hgb 10.3 Labs (11/23): hgb 12.9  PMH: 1. CAD: PCI in 2005, uncertain what vessel.  - Cardiolite 2017: Inferior and inferolateral large fixed defect suggestive of prior infarction, EF 28%.  - LHC (8/22): No significant CAD.  2. Chronic systolic CHF: Ischemic cardiomyopathy. St Jude subcutaneous ICD.  - Echo (7/20) with EF 25-30%.  - Echo (10/20) with EF 40-45% - Echo (2/22): EF 25-30%, moderate LV dilation, mild LVH, normal RV size and systolic function, mild MR.  - LHC/RHC (8/22): mean RA 2, PA 24/3, mean PCWP 9, CI 3.13; no significant coronary disease.  -  Echo (11/22): EF 25-30%, moderate LV dilation, diffuse hypokinesis, normal RV. - Echo (9/23): EF 20-25%, severe LV dysfunction with global HK, grade III DD, normal RV, mild to moderate MR - R/LHC (9/23): non-obs CAD, elevated R/L pressures with pulmonary venous hypertension. RA mean 14, PA 53/27  (mean 37), PCWP mean 26, CO/CI (Fick) 8.98/3.96, PVR 2.8 WU 3. ESRD since 2/22 4. OSA on CPAP 5. H/o VT arrest: Subcutaneous St Jude ICD placed in 2015 at [facility].  - VT storm 6/23, amiodarone started.  - VT  storm 9/23, mexiletine started 6. COVID-19 1/22.  7. HTN 8. Hyperlipidemia 9. Recurrent angioedema.  10. ? Sarcoidosis: Hi-Res chest CT (10/23) with no pulmonary findings suggestive of pulmonary sarcoidosis.  - Start on empiric prednisone. - Cardiac PET at [facility] ordered  Social History   Socioeconomic History   Marital status: Single    Spouse name: Not on file   Number of children: 3   Years of education: Not on file   Highest education level: Not on file  Occupational History   Occupation: retired  Tobacco Use   Smoking status: Never   Smokeless tobacco: Never  Vaping Use   Vaping status: Never Used  Substance and Sexual Activity   Alcohol use: No   Drug use: No   Sexual activity: Yes  Other Topics Concern   Not on file  Social History Narrative   ** Merged History Encounter **       Social Determinants of Health   Financial Resource Strain: Low Risk  (02/17/2023)   Overall Financial Resource Strain (CARDIA)    Difficulty of Paying Living Expenses: Not hard at all  Food Insecurity: No Food Insecurity (02/17/2023)   Hunger Vital Sign    Worried About Running Out of Food in the Last Year: Never true    Ran Out of Food in the Last Year: Never true  Transportation Needs: No Transportation Needs (02/17/2023)   PRAPARE - Administrator, Civil Service (Medical): No    Lack of Transportation (Non-Medical): No  Physical Activity: Inactive (02/17/2023)   Exercise Vital Sign    Days of Exercise per Week: 0 days    Minutes of Exercise per Session: 0 min  Stress: No Stress Concern Present (02/17/2023)   [employer] of Occupational Health - Occupational Stress Questionnaire    Feeling of Stress : Only a little  Social Connections: Not on file  Intimate Partner Violence: Not At Risk (08/28/2022)   Humiliation, Afraid, Rape, and Kick questionnaire    Fear of Current or Ex-Partner: No    Emotionally Abused: No    Physically Abused: No    Sexually Abused: No    Family History  Problem Relation Age of Onset   Hypertension Mother    Liver disease Father    Colon cancer Neg Hx    Esophageal cancer Neg Hx    Rectal cancer Neg Hx    Stomach cancer Neg Hx    ROS: All systems reviewed and negative except as per HPI.   Current Outpatient Medications  Medication Sig Dispense Refill   albuterol (PROVENTIL) (2.5 MG/3ML) 0.083% nebulizer solution INHALE THE CONTENTS OF 1 VIAL BY MOUTH EVERY 6 HOURS AS NEEDED 675 mL 1   albuterol (VENTOLIN HFA) 108 (90 Base) MCG/ACT inhaler INHALE TWO PUFFS BY MOUTH EVERY 4 HOURS AS NEEDED 60.3 g 2   amiodarone (PACERONE) 200 MG tablet Take 1 tablet (200 mg total) by mouth 2 (two) times daily. 60 tablet  2   apixaban (ELIQUIS) 5 MG TABS tablet NEW PRESCRIPTION REQUEST: Eliquis 5 Mg - TAKE ONE TABLET BY MOUTH TWICE DAILY 180 tablet 3   atorvastatin (LIPITOR) 80 MG tablet Take 1 tablet (80 mg total) by mouth at bedtime. 90 tablet 3   budesonide-formoterol (SYMBICORT) 80-4.5 MCG/ACT inhaler INHALE TWO PUFFS BY MOUTH INTO THE LUNGS TWICE DAILY 10.2 g 0   calcium acetate (PHOSLO) 667 MG capsule Take 2 capsule by mouth three times a day with meals 180 capsule 6   carvedilol (COREG) 12.5 MG tablet Carvedilol 12.5 Mg- TAKE ONE TABLET BY MOUTH TWICE DAILY Do not take on dialysis days Tuesday Thursday Saturdays 180 tablet 3   Cholecalciferol 125 MCG (5000 UT) TABS Take 1 tablet (5,000 Units total) by mouth daily. 90 tablet 1   docusate sodium (COLACE) 100 MG capsule TAKE ONE CAPSULE BY MOUTH DAILY 90 capsule 1   EPINEPHrine 0.3 mg/0.3 mL IJ SOAJ injection Inject 0.3 mg into the muscle once as needed for up to 2 doses (if worsening tongue swelling, SOB, hypoxia, or other concerns for progressive anaphylaxis). 2 each 2   ethyl chloride spray SPRAY A SMALL AMOUNT THREE TIMES A WEEK JUST PRIOR TO NEEDLE INSERTION 116 mL 5   fluticasone (FLONASE) 50 MCG/ACT nasal spray INSTILL 1 SPRAY IN EACH NOSTRIL DAILY AS NEEDED 47.4 mL 1    hydrOXYzine (ATARAX) 25 MG tablet Take 3 tablets (75 mg total) by mouth at bedtime. 90 tablet 1   lanthanum (FOSRENOL) 1000 MG chewable tablet Chew 2 tablets (2,000 mg total) by mouth 3 (three) times daily AND 1 tablet (1,000 mg total) 2 (two) times daily as needed with snacks 720 tablet 3   linaclotide (LINZESS) 72 MCG capsule TAKE 1 CAPSULE (72 MCG TOTAL) BY MOUTH EVERY MONDAY, WEDNESDAY, FRIDAY, AND SATURDAY. 64 capsule 1   mexiletine (MEXITIL) 150 MG capsule NEW PRESCRIPTION REQUEST: Mexiletine 150 Mg- TAKE TWO CAPSULES BY MOUTH TWICE DAILY 360 capsule 3   pantoprazole (PROTONIX) 40 MG tablet NEW PRESCRIPTION REQUEST: Pantoprazole Sod Dr 40 Mg - TAKE ONE TABLET BY MOUTH DAILY 90 tablet 3   PEG-KCl-NaCl-NaSulf-Na Asc-C (PLENVU) 140 g SOLR Take 1 kit by mouth as directed. 3 each 0   Polyethylene Glycol 3350 (PEG 3350) 17 g PACK NEW PRESCRIPTION REQUEST: Polyethylene Glycol 3350- 1 PACKET BY MOUTH DAILY AS NEEDED 90 packet 0   predniSONE (DELTASONE) 10 MG tablet TAKE ONE TABLET BY MOUTH DAILY WITH BREAKFAST 30 tablet 0   Rectal Protectant-Emollient (CALMOL-4) 76-10 % SUPP Use as directed once to twice daily 8 suppository 0   sildenafil (VIAGRA) 50 MG tablet Take 50 mg by mouth daily as needed.     tadalafil (CIALIS) 10 MG tablet Take 1 tablet (10 mg total) by mouth daily as needed 30 min prior to sexual activity.  Do not take with Imdur (isosorbide) or nitroglycerin 30 tablet 1   VELPHORO 500 MG chewable tablet Chew 1,000 mg by mouth 3 (three) times daily. (Patient not taking: Reported on 06/27/2023)     No current facility-administered medications for this visit.   Wt Readings from Last 3 Encounters:  07/27/23 116 kg (255 lb 12.8 oz)  06/27/23 114.3 kg (252 lb)  02/25/23 114.9 kg (253 lb 6.4 oz)   There were no vitals taken for this visit. General:  NAD. No resp difficulty HEENT: Normal Neck: Supple. No JVD, thick neck. Carotids 2+ bilat; no bruits. No lymphadenopathy or thryomegaly  appreciated. Cor: PMI nondisplaced. Regular rate &  rhythm. No rubs, gallops or murmurs. Lungs: Clear Abdomen: Soft, nontender, nondistended. No hepatosplenomegaly. No bruits or masses. Good bowel sounds. Extremities: No cyanosis, clubbing, rash, edema Neuro: Alert & oriented x 3, cranial nerves grossly intact. Moves all 4 extremities w/o difficulty. Affect pleasant.  Assessment/Plan: 1. VT storm: Has has a Science writer ICD.  Admission in 9/23 with refractory monomorphic VT. Multiple shocks.  Later alternating NSR with a slow wide complex AIVR-like rhythm. Recurrent monomorphic VT that same admission w/ repeated shocks>>re-intubated, rebolused with amio, quinidine discontinued, started on procainamide.  Given concern for possible cardiac sarcoid, started empirically on prednisone. HiRes Chest CT this admit with no evidence of pulmonary sarcoid. Cath this admit with nonobstructive CAD. Echo 9/23 with EF 20-25%, GIIIDD (restrictive), RV normal. Recurrent VT on 09/07/22 with ICD shock.  He is on amiodarone 400 mg daily + mexiletine 300 bid + Coreg 12.5 mg bid.   - EP has decided against VT ablation, do not think it would be likely to be successful and very high risk.  ICD is now de-activated. He has not felt palpitations recently, however he had syncope at HD, likely due to low BP as he was taking his BP-active medications on HD day, but cannot rule out arrhythmia. Will discuss further with Dr. Shirlee Latch, ? Increase amiodarone. - Continue Coreg 12.5 mg bid, discussed holding BEFORE HD session. - Continue amiodarone 200 mg bid, per EP. - Continue mexiletine 300 mg bid.  - Long term very concerning, not candidate for LVAD with ESRD.   - He is on empiric prednisone for possible cardiac sarcoidosis. He is now on 10 mg daily and off Bactrim. He still has not had cardiac PET as insurance has denied this x 2. Ideally would try to get him off prednisone prior cardiac PET to have best chance of detecting active cardiac  sarcoidosis. Will see if we have other options, but in mean time continue trying to arrange cardiac PET at [facility] to evaluate for cardiac sarcoidosis. - Continue EP followup.  2. Chronic systolic CHF: H/o NICM. Echo (9/23) with EF 20-25%, normal RV function, mild-moderate MR, IVC dilated.  In 9/23, unable to complete cardiac MRI due to artifact from [facility] ICD.  PYP scan was negative.  RHC in 9/23 showed volume overload>>CRRT for volume removal.  Now volume management via iHD. Today, NYHA II-early III. - With low BP stop BiDil. - Continue Coreg 12.5 mg bid. Hold before HD with low BPs - He has a LBBB-like IVCD but has [place] ICD and questionable benefit to change from [facility] ICD to BiV device in dialysis patient.  3. CAD: ?PCI in 2005, unsure what vessel.  Coronary angiography in 8/22 showed no significant CAD and also does not appear to show prior stent so h/o CAD is questionable. LHC in 9/23 with nonobstructive CAD.  - Continue statin. - No ASA with anticoagulation.  4. ESRD: Since 2/22. TTS HD.  5. Atrial fibrillation: Paroxysmal.  NSR today.  - Remains on amiodarone. Check LFTs and TSH. He will need a regular eye exam, we discussed this today.  - Continue Eliquis. Check CBC today with recent BRBpR.  Follow up in 2-3 months with Dr. Shirlee Latch. Work to expedite PET.   Anderson [place], FNP-BC 08/01/2023

## 2023-08-02 ENCOUNTER — Ambulatory Visit (HOSPITAL_COMMUNITY)
Admission: RE | Admit: 2023-08-02 | Discharge: 2023-08-02 | Disposition: A | Discharge status: Home / Self Care | Payer: 59 | Source: Ambulatory Visit | Attending: Family Medicine | Admitting: Family Medicine

## 2023-08-02 ENCOUNTER — Encounter (HOSPITAL_COMMUNITY): Payer: Self-pay

## 2023-08-02 VITALS — BP 102/74 | HR 66 | Wt 258.4 lb

## 2023-08-02 DIAGNOSIS — I447 Left bundle-branch block, unspecified: Secondary | ICD-10-CM | POA: Diagnosis not present

## 2023-08-02 DIAGNOSIS — I480 Paroxysmal atrial fibrillation: Secondary | ICD-10-CM | POA: Diagnosis not present

## 2023-08-02 DIAGNOSIS — Z79899 Other long term (current) drug therapy: Secondary | ICD-10-CM | POA: Insufficient documentation

## 2023-08-02 DIAGNOSIS — D631 Anemia in chronic kidney disease: Secondary | ICD-10-CM | POA: Diagnosis not present

## 2023-08-02 DIAGNOSIS — Z7901 Long term (current) use of anticoagulants: Secondary | ICD-10-CM | POA: Insufficient documentation

## 2023-08-02 DIAGNOSIS — I472 Ventricular tachycardia, unspecified: Secondary | ICD-10-CM | POA: Diagnosis not present

## 2023-08-02 DIAGNOSIS — Z95810 Presence of automatic (implantable) cardiac defibrillator: Secondary | ICD-10-CM | POA: Insufficient documentation

## 2023-08-02 DIAGNOSIS — I5022 Chronic systolic (congestive) heart failure: Secondary | ICD-10-CM | POA: Diagnosis not present

## 2023-08-02 DIAGNOSIS — Z7952 Long term (current) use of systemic steroids: Secondary | ICD-10-CM | POA: Diagnosis not present

## 2023-08-02 DIAGNOSIS — I2510 Atherosclerotic heart disease of native coronary artery without angina pectoris: Secondary | ICD-10-CM

## 2023-08-02 DIAGNOSIS — T782XXA Anaphylactic shock, unspecified, initial encounter: Secondary | ICD-10-CM | POA: Diagnosis not present

## 2023-08-02 DIAGNOSIS — T7840XA Allergy, unspecified, initial encounter: Secondary | ICD-10-CM | POA: Diagnosis not present

## 2023-08-02 DIAGNOSIS — N186 End stage renal disease: Secondary | ICD-10-CM | POA: Diagnosis not present

## 2023-08-02 DIAGNOSIS — D509 Iron deficiency anemia, unspecified: Secondary | ICD-10-CM | POA: Diagnosis not present

## 2023-08-02 DIAGNOSIS — I428 Other cardiomyopathies: Secondary | ICD-10-CM | POA: Insufficient documentation

## 2023-08-02 DIAGNOSIS — R52 Pain, unspecified: Secondary | ICD-10-CM | POA: Diagnosis not present

## 2023-08-02 DIAGNOSIS — L299 Pruritus, unspecified: Secondary | ICD-10-CM | POA: Diagnosis not present

## 2023-08-02 DIAGNOSIS — I132 Hypertensive heart and chronic kidney disease with heart failure and with stage 5 chronic kidney disease, or end stage renal disease: Secondary | ICD-10-CM | POA: Insufficient documentation

## 2023-08-02 DIAGNOSIS — Z992 Dependence on renal dialysis: Secondary | ICD-10-CM | POA: Diagnosis not present

## 2023-08-02 DIAGNOSIS — N2581 Secondary hyperparathyroidism of renal origin: Secondary | ICD-10-CM | POA: Diagnosis not present

## 2023-08-02 NOTE — Progress Notes (Deleted)
Cardiology Office Note Date:  08/02/2023  Patient ID:  Victor Thompson, Victor Thompson [birth year], MRN [id number] PCP:  [name], MD  Cardiologist:  Dr. Algie Coffer  >>> pt preference, now Dr Mayford Knife Electrophysiologist: Dr. Johney Frame >> Dr. Ladona Ridgel Nephrologist: Dr. Marisue Humble    Chief Complaint:  f/u Dr. Jenel Lucks visit  History of Present Illness: [name] is a 62 y.o. male with history of CAD (PCI 2007), ICM, VT, HTN, HLD, ESRF on HD, chronic CHF (systolic), OSA w/CPAP, morbid obesity, AFib  H/o episode of angioedema of unknown trigger that requires epi pen use  June 2023 admitted for VT storm, he was observed then to have recurrent NSPMVT he was volume OL required urgent HD with surges of VT that did better afterwards as well as the addition of amiodarone.   Readmitted 08/16/22 VT storm, denied any missed HD, had missed some meds including amiodarone Bolused > gtt Device noted 28 appropriate shocks Initial transition to PO amiodarone failed with recurrent arrhythmia > gtt > eventual PO Attempts for c.MRI failed with significant device artofact PYP scan during this admission was not suggestive of TTR amyloid >> planned to look for sarcoid pursue PET outpt Discharged 08/22/23  RE-admitted 08/25/23 with recurrent VT storm,  In the ED he was shocked > 10 times. Magnet was applied, and discussions involved intubation and sedation to stabilize pt. Unfortunately, he then went into VF with syncope requiring external cardioversion. Pt was intubated, sedated, and paralyzed  R/LHC 08/25/2022   1st Diag lesion is 60% stenosed.   Ost LAD to Prox LAD lesion is 40% stenosed.   Mid Cx lesion is 30% stenosed.   Prox RCA lesion is 30% stenosed.   1. Elevated right and left heart filling pressures with pulmonary venous hypertension.  2. Nonobstructive CAD.   loaded on amiodarone and due to reports of allergy to lidocaine was tried on quinidine.      Quinidine was ultimately felt to be ineffective, and  transitioned to procainamide. This was also felt to be ineffective, and with no oral equivalent available felt to be a poor longer term option.  Other significant events included:   9/24 multiple repeated shocks for VT monomorphic, reintubated, rebolused with amio.   9/25 Given concern for possible cardiac sarcoid, started empirically on prednisone. IV procainamide discontinued by EP  9/27 started on ceftriaxone for suspected aspiration PNA. Mexiletine started for VT suppression   9/28 Extubated 10/3 Recurrent VT, ICD shock. Amiodarone and mexiletine both increased. 10/4 Palliative care consulted due to continued refractory VT.  Pt ultimately decided to become DNR/DNI and ICD was deactivated. He understands risk of mortality, and his primarily goal is to get home and to manage his affairs.  Discharged 09/11/23  He has seen Dr. Ladona Ridgel and HF team a couple times since  Dr. Ladona Ridgel 1/9/224, insurance auth pending for PET, feeling suprisingly well, amio had been reduced via HF team > 200mg  BID at that time. No changes to his meds.  Saw the HF team APP 01/19/23, insurance denied PET, some DOE with steps, struggling with low BPs at HD, device HV therapies remained off Not advanced therapies candidate given ESRF and HD BiDIl stopped 2/2 hypotension Instructed to hold coreg ahead of his HD  HF team APP 08/02/23, tolerating HD, doing "OK", reported declining vision, device HV tx remain off, no changes mad, planned for 3 mo f/u, advise eye MD visit  *** HV therapies? *** DNR still? *** VT?, syncope? *** eliquis, dose, bleeding ***  amio labs?  Eye MD ?  Vision changes  Device information: BSCi S-ICD, implanted 2015 at [facility] + h/o VT, VF and appropriate shocks  AAD  Amiodarone started June 2023   Past Medical History:  Diagnosis Date   Acute on chronic systolic CHF (congestive heart failure) (HCC) 06/14/2020   Acute respiratory failure (HCC) 05/28/2022   AICD (automatic  cardioverter/defibrillator) present    2015   Allergy    Asthma    no meds   Cardiac arrest (HCC) 2015   Chest pain 05/25/2022   CHF (congestive heart failure) (HCC)    Coronary artery disease    ESRD on hemodialysis (HCC)    ckd -stage 5   Hyperlipidemia    Hypertension    Myocardial infarction (HCC)    Obesity    Pneumonia    Shortness of breath dyspnea    Sleep apnea    USES CPAP   Wears glasses     Past Surgical History:  Procedure Laterality Date   AV FISTULA PLACEMENT Right 03/15/2019   Procedure: RIGHT ARM ARTERIOVENOUS (AV) FISTULA CREATION;  Surgeon: Chuck Hint, MD;  Location: [facility] OR;  Service: Vascular;  Laterality: Right;   COLONOSCOPY     COLONOSCOPY W/ BIOPSIES AND POLYPECTOMY     CORONARY STENT PLACEMENT  2007   IMPLANTABLE CARDIOVERTER DEFIBRILLATOR IMPLANT  2015   RIGHT/LEFT HEART CATH AND CORONARY ANGIOGRAPHY N/A 07/08/2021   Procedure: RIGHT/LEFT HEART CATH AND CORONARY ANGIOGRAPHY;  Surgeon: Laurey Morale, MD;  Location: [facility] INVASIVE CV LAB;  Service: Cardiovascular;  Laterality: N/A;   RIGHT/LEFT HEART CATH AND CORONARY ANGIOGRAPHY N/A 08/25/2022   Procedure: RIGHT/LEFT HEART CATH AND CORONARY ANGIOGRAPHY;  Surgeon: Laurey Morale, MD;  Location[facility] INVASIVE CV LAB;  Service: Cardiovascular;  Laterality: N/A;   SUBQ ICD CHANGEOUT N/A 10/13/2020   Procedure: SUBQ ICD CHANGEOUT;  Surgeon: Marinus Maw, MD;  Location: [facility] INVASIVE CV LAB;  Service: Cardiovascular;  Laterality: N/A;    Current Outpatient Medications  Medication Sig Dispense Refill   albuterol (PROVENTIL) (2.5 MG/3ML) 0.083% nebulizer solution INHALE THE CONTENTS OF 1 VIAL BY MOUTH EVERY 6 HOURS AS NEEDED 675 mL 1   albuterol (VENTOLIN HFA) 108 (90 Base) MCG/ACT inhaler INHALE TWO PUFFS BY MOUTH EVERY 4 HOURS AS NEEDED 60.3 g 2   amiodarone (PACERONE) 200 MG tablet Take 1 tablet (200 mg total) by mouth 2 (two) times daily. 60 tablet 2   apixaban (ELIQUIS) 5 MG TABS tablet NEW  PRESCRIPTION REQUEST: Eliquis 5 Mg - TAKE ONE TABLET BY MOUTH TWICE DAILY 180 tablet 3   atorvastatin (LIPITOR) 80 MG tablet Take 1 tablet (80 mg total) by mouth at bedtime. 90 tablet 3   budesonide-formoterol (SYMBICORT) 80-4.5 MCG/ACT inhaler INHALE TWO PUFFS BY MOUTH INTO THE LUNGS TWICE DAILY 10.2 g 0   carvedilol (COREG) 12.5 MG tablet Carvedilol 12.5 Mg- TAKE ONE TABLET BY MOUTH TWICE DAILY Do not take on dialysis days Tuesday Thursday Saturdays 180 tablet 3   Cholecalciferol 125 MCG (5000 UT) TABS Take 1 tablet (5,000 Units total) by mouth daily. 90 tablet 1   docusate sodium (COLACE) 100 MG capsule TAKE ONE CAPSULE BY MOUTH DAILY 90 capsule 1   EPINEPHrine 0.3 mg/0.3 mL IJ SOAJ injection Inject 0.3 mg into the muscle once as needed for up to 2 doses (if worsening tongue swelling, SOB, hypoxia, or other concerns for progressive anaphylaxis). 2 each 2   ethyl chloride spray SPRAY A SMALL AMOUNT  THREE TIMES A WEEK JUST PRIOR TO NEEDLE INSERTION 116 mL 5   fluticasone (FLONASE) 50 MCG/ACT nasal spray INSTILL 1 SPRAY IN EACH NOSTRIL DAILY AS NEEDED 47.4 mL 1   hydrOXYzine (ATARAX) 25 MG tablet Take 3 tablets (75 mg total) by mouth at bedtime. 90 tablet 1   lanthanum (FOSRENOL) 1000 MG chewable tablet Chew 2 tablets (2,000 mg total) by mouth 3 (three) times daily AND 1 tablet (1,000 mg total) 2 (two) times daily as needed with snacks 720 tablet 3   linaclotide (LINZESS) 72 MCG capsule TAKE 1 CAPSULE (72 MCG TOTAL) BY MOUTH EVERY MONDAY, WEDNESDAY, FRIDAY, AND SATURDAY. 64 capsule 1   mexiletine (MEXITIL) 150 MG capsule NEW PRESCRIPTION REQUEST: Mexiletine 150 Mg- TAKE TWO CAPSULES BY MOUTH TWICE DAILY 360 capsule 3   pantoprazole (PROTONIX) 40 MG tablet NEW PRESCRIPTION REQUEST: Pantoprazole Sod Dr 40 Mg - TAKE ONE TABLET BY MOUTH DAILY 90 tablet 3   PEG-KCl-NaCl-NaSulf-Na Asc-C (PLENVU) 140 g SOLR Take 1 kit by mouth as directed. 3 each 0   Polyethylene Glycol 3350 (PEG 3350) 17 g PACK NEW  PRESCRIPTION REQUEST: Polyethylene Glycol 3350- 1 PACKET BY MOUTH DAILY AS NEEDED 90 packet 0   predniSONE (DELTASONE) 10 MG tablet TAKE ONE TABLET BY MOUTH DAILY WITH BREAKFAST 30 tablet 0   Rectal Protectant-Emollient (CALMOL-4) 76-10 % SUPP Use as directed once to twice daily 8 suppository 0   sildenafil (VIAGRA) 50 MG tablet Take 50 mg by mouth daily as needed.     tadalafil (CIALIS) 10 MG tablet Take 1 tablet (10 mg total) by mouth daily as needed 30 min prior to sexual activity.  Do not take with Imdur (isosorbide) or nitroglycerin 30 tablet 1   VELPHORO 500 MG chewable tablet Chew 1,000 mg by mouth 3 (three) times daily.     No current facility-administered medications for this visit.    Allergies:   Ace inhibitors, Influenza vaccines, Ketorolac, Lidocaine, Lisinopril, and Penicillins   Social History:  The patient  reports that he has never smoked. He has never used smokeless tobacco. He reports that he does not drink alcohol and does not use drugs.   Family History:  The patient's family history includes Hypertension in his mother; Liver disease in his father.  ROS:  Please see the history of present illness.   All other systems are reviewed and otherwise negative.   PHYSICAL EXAM:  VS:  There were no vitals taken for this visit. BMI: There is no height or weight on file to calculate BMI. Well nourished, well developed, in no acute distress  HEENT: normocephalic, atraumatic  Neck: no JVD, carotid bruits or masses Cardiac: *** RRR; no significant murmurs, no rubs, or gallops Lungs:  *** CTA b/l, no wheezing, rhonchi or rales  Abd: soft, nontender, obese MS: no deformity or atrophy Ext: *** trace-1+ edema  Skin: warm and dry, no rash Neuro:  No gross deficits appreciated Psych: euthymic mood, full affect  *** S-CD site is stable, no tethering or discomfort   EKG:  done today and reviewed by myself ***  ICD interrogation done today and reviewed by myself:  ***    PYP  Scan 08/19/22 IMPRESSION: Visual and quantitative assessment (grade 0, H/CL = 0.94) is not suggestive of transthyretin amyloidosis.   Significant tracer localization in the far anterior chest at the expected position of the sternum/LEFT parasternal region. This may reflect sternal osseous uptake, presternal wound, or anterior chest wall trauma but anterior mediastinal uptake/mass not  entirely excluded (though considered less likely); recommend CT imaging to determine etiology.  06/07/2019: TTE IMPRESSIONS   1. The left ventricle has severely reduced systolic function, with an ejection fraction of 25-30%. The cavity size was severely dilated. There is mildly increased left ventricular wall thickness. Left ventricular diastolic Doppler parameters are  consistent with pseudonormalization. Elevated mean left atrial pressure Left ventricular diffuse hypokinesis.  2. The right ventricle has normal systolic function. The cavity was normal.  3. The mitral valve is grossly normal. Mild thickening of the mitral valve leaflet.  4. The aortic valve is tricuspid. Mild thickening of the aortic valve. No stenosis of the aortic valve.  5. Severe LV dysfunction; mild LVH; severe LVE; moderate diastolic dysfunction; mild MR.   FINDINGS  Left Ventricle: The left ventricle has severely reduced systolic function, with an ejection fraction of 25-30%. The cavity size was severely dilated. There is mildly increased left ventricular wall thickness. Left ventricular diastolic Doppler  parameters are consistent with pseudonormalization. Elevated mean left atrial pressure Left ventricular diffuse hypokinesis.   Right Ventricle: The right ventricle has normal systolic function. The cavity was normal.   Left Atrium: Left atrial size was normal in size.   Right Atrium: Right atrial size was normal in size.   Pericardium: There is no evidence of pericardial effusion.   Mitral Valve: The mitral valve is grossly normal.  Mild thickening of the mitral valve leaflet. Mitral valve regurgitation is mild by color flow Doppler.   Tricuspid Valve: The tricuspid valve is normal in structure. Tricuspid valve regurgitation is mild by color flow Doppler.   Aortic Valve: The aortic valve is tricuspid Mild thickening of the aortic valve. Aortic valve regurgitation was not visualized by color flow Doppler. There is No stenosis of the aortic valve.   Pulmonic Valve: The pulmonic valve was normal in structure. Pulmonic valve regurgitation is not visualized by color flow Doppler.   Venous: The inferior vena cava is normal in size with greater than 50% respiratory variability.   Additional Comments: Severe LV dysfunction; mild LVH; severe LVE; moderate diastolic dysfunction; mild MR.     03/06/2016: Stress myoview IMPRESSION: 1. Large zone of non reversible decreased myocardial perfusion involving the inferolateral 1 post oral lateral walls of LEFT ventricle, extending to the inferior apex. Diffuse myocardial thinning and LEFT ventricular dilatation.   2. Dilated LEFT ventricle with global hypokinesia.   3. Left ventricular ejection fraction 28%   4. High-risk stress test findings based on presence of a large fixed defect with LEFT ventricular dilatation*.     Recent Labs: 09/10/2022: Magnesium 2.0 09/29/2022: B Natriuretic Peptide 431.5 10/26/2022: BUN 39; Creatinine, Ser 10.64; Potassium 3.9; Sodium 145 01/19/2023: ALT 74; Hemoglobin 14.1; Platelets 206; TSH 2.599  08/16/2022: Cholesterol 230; HDL 27; LDL Cholesterol 164; Total CHOL/HDL Ratio 8.5; VLDL 39 09/01/2022: Triglycerides 47   CrCl cannot be calculated (Patient's most recent lab result is older than the maximum 21 days allowed.).   Wt Readings from Last 3 Encounters:  08/02/23 258 lb 6.4 oz (117.2 kg)  07/27/23 255 lb 12.8 oz (116 kg)  06/27/23 252 lb (114.3 kg)     Other studies reviewed: Additional studies/records reviewed today include:  summarized above  ASSESSMENT AND PLAN:  1. H/o cardiac arrest 2. Recurrent/refrcatory VT storms DNR > HV Therapies are OFF    3. ICM 4. Chronic CHF (systolic)     endstage     *** On BB, diuretic tx, no ACE/ARB given severe renal disease     ***  5. Paroxysmal AFib CHA2DS2Vasc is 3, on eliquis, *** appropriately dosed *** burden by symptoms  6. Secondary hypercoagulable state     Disposition: F/u with Device clinic in 26mo follow battery, c/w Dr. Mayford Knife as scheduled.  We can see him sooner if needed.  He is instructed to let us know when/if  He ever gets shocked again   Current medicines are reviewed at length with the patient today.  The patient did not have any concerns regarding medicines.  Norma Fredrickson, PA-C 08/02/2023 12:49 PM     [facility] [address] [place] [address] [phone] (office)  [phone] (fax)

## 2023-08-02 NOTE — Patient Instructions (Signed)
Medication Changes:  No Changes In Medications at this time.   Testing/Procedures:  CARDIAC PET SARCOID- WE ARE WORKING ON INSURANCE APPROVAL- WE WILL NOTIFY YOU REGARDING THIS IF PET SCAN SCHEDULED- PLEASE FOLLOW UP DIET PLAN AND INSTRUCTIONS GIVEN TODAY AT APPOINTMENT  Special Instructions // Education:  PLEASE FOLLOW UP WITH YOUR EYE DOCTOR  Follow-Up in: 3 MONTHS WITH DR. Shirlee Latch AS SCHEDULED   At the [facility], you and your health needs are our priority. We have a designated team specialized in the treatment of Heart Failure. This Care Team includes your primary Heart Failure Specialized Cardiologist (physician), Advanced Practice Providers (APPs- Physician Assistants and Nurse Practitioners), and Pharmacist who all work together to provide you with the care you need, when you need it.   You may see any of the following providers on your designated Care Team at your next follow up:  Dr. Arvilla Meres Dr. Marca Ancona Dr. Marcos Eke, NP Robbie Lis, [place] [facility] [place], [place] Brynda Peon, NP Karle Plumber, PharmD   Please be sure to bring in all your medications bottles to every appointment.   Need to Contact us:  If you have any questions or concerns before your next appointment please send [place] a message through [place] or call our office at [phone].    TO LEAVE A MESSAGE FOR THE NURSE SELECT OPTION 2, PLEASE LEAVE A MESSAGE INCLUDING: YOUR NAME DATE OF BIRTH CALL BACK NUMBER REASON FOR CALL**this is important as we prioritize the call backs  YOU WILL RECEIVE A CALL BACK THE SAME DAY AS LONG AS YOU CALL BEFORE 4:00 PM

## 2023-08-04 ENCOUNTER — Ambulatory Visit: Payer: 59 | Admitting: Physician Assistant

## 2023-08-04 DIAGNOSIS — N186 End stage renal disease: Secondary | ICD-10-CM | POA: Diagnosis not present

## 2023-08-04 DIAGNOSIS — R52 Pain, unspecified: Secondary | ICD-10-CM | POA: Diagnosis not present

## 2023-08-04 DIAGNOSIS — T782XXA Anaphylactic shock, unspecified, initial encounter: Secondary | ICD-10-CM | POA: Diagnosis not present

## 2023-08-04 DIAGNOSIS — T7840XA Allergy, unspecified, initial encounter: Secondary | ICD-10-CM | POA: Diagnosis not present

## 2023-08-04 DIAGNOSIS — N2581 Secondary hyperparathyroidism of renal origin: Secondary | ICD-10-CM | POA: Diagnosis not present

## 2023-08-04 DIAGNOSIS — L299 Pruritus, unspecified: Secondary | ICD-10-CM | POA: Diagnosis not present

## 2023-08-04 DIAGNOSIS — D631 Anemia in chronic kidney disease: Secondary | ICD-10-CM | POA: Diagnosis not present

## 2023-08-04 DIAGNOSIS — D509 Iron deficiency anemia, unspecified: Secondary | ICD-10-CM | POA: Diagnosis not present

## 2023-08-04 DIAGNOSIS — Z992 Dependence on renal dialysis: Secondary | ICD-10-CM | POA: Diagnosis not present

## 2023-08-05 ENCOUNTER — Encounter: Payer: Self-pay | Admitting: Physician Assistant

## 2023-08-06 DIAGNOSIS — T782XXA Anaphylactic shock, unspecified, initial encounter: Secondary | ICD-10-CM | POA: Diagnosis not present

## 2023-08-06 DIAGNOSIS — D509 Iron deficiency anemia, unspecified: Secondary | ICD-10-CM | POA: Diagnosis not present

## 2023-08-06 DIAGNOSIS — I129 Hypertensive chronic kidney disease with stage 1 through stage 4 chronic kidney disease, or unspecified chronic kidney disease: Secondary | ICD-10-CM | POA: Diagnosis not present

## 2023-08-06 DIAGNOSIS — L299 Pruritus, unspecified: Secondary | ICD-10-CM | POA: Diagnosis not present

## 2023-08-06 DIAGNOSIS — T7840XA Allergy, unspecified, initial encounter: Secondary | ICD-10-CM | POA: Diagnosis not present

## 2023-08-06 DIAGNOSIS — N2581 Secondary hyperparathyroidism of renal origin: Secondary | ICD-10-CM | POA: Diagnosis not present

## 2023-08-06 DIAGNOSIS — Z992 Dependence on renal dialysis: Secondary | ICD-10-CM | POA: Diagnosis not present

## 2023-08-06 DIAGNOSIS — R52 Pain, unspecified: Secondary | ICD-10-CM | POA: Diagnosis not present

## 2023-08-06 DIAGNOSIS — N186 End stage renal disease: Secondary | ICD-10-CM | POA: Diagnosis not present

## 2023-08-06 DIAGNOSIS — D631 Anemia in chronic kidney disease: Secondary | ICD-10-CM | POA: Diagnosis not present

## 2023-08-07 DIAGNOSIS — G4733 Obstructive sleep apnea (adult) (pediatric): Secondary | ICD-10-CM | POA: Diagnosis not present

## 2023-08-07 DIAGNOSIS — J449 Chronic obstructive pulmonary disease, unspecified: Secondary | ICD-10-CM | POA: Diagnosis not present

## 2023-08-09 DIAGNOSIS — T782XXA Anaphylactic shock, unspecified, initial encounter: Secondary | ICD-10-CM | POA: Diagnosis not present

## 2023-08-09 DIAGNOSIS — N186 End stage renal disease: Secondary | ICD-10-CM | POA: Diagnosis not present

## 2023-08-09 DIAGNOSIS — L299 Pruritus, unspecified: Secondary | ICD-10-CM | POA: Diagnosis not present

## 2023-08-09 DIAGNOSIS — T7840XA Allergy, unspecified, initial encounter: Secondary | ICD-10-CM | POA: Diagnosis not present

## 2023-08-09 DIAGNOSIS — Z992 Dependence on renal dialysis: Secondary | ICD-10-CM | POA: Diagnosis not present

## 2023-08-09 DIAGNOSIS — D509 Iron deficiency anemia, unspecified: Secondary | ICD-10-CM | POA: Diagnosis not present

## 2023-08-09 DIAGNOSIS — N2581 Secondary hyperparathyroidism of renal origin: Secondary | ICD-10-CM | POA: Diagnosis not present

## 2023-08-09 DIAGNOSIS — D631 Anemia in chronic kidney disease: Secondary | ICD-10-CM | POA: Diagnosis not present

## 2023-08-10 NOTE — Progress Notes (Signed)
Patient ID: Victor Thompson, male   DOB: January 16, 1961, 62 y.o.   MRN: [id number]   Victor Thompson, is a 62 y.o. male  [id number]  [id number]  DOB - 02-07-1961  Chief Complaint  Patient presents with   Hip Pain    Left side pain       Subjective:   Victor Thompson is a 62 y.o. male here and did not want to be seen/wait for provider No problems updated.  ALLERGIES: Allergies  Allergen Reactions   Ace Inhibitors Swelling    Swelling of the tongue   Influenza Vaccines Hives and Swelling    SWELLING REACTION UNSPECIFIED    Ketorolac Swelling    SWELLING REACTION UNSPECIFIED    Lidocaine Swelling    TONGUE SWELLS   Lisinopril Swelling    TONGUE SWELLING Pt reported problem with a BP med which sounded like  lisinopril  But as of 09/19/06,pt had tolerated altace without problem   Penicillins Swelling    TONGUE SWELLS Has patient had a PCN reaction causing immediate rash, facial/tongue/throat swelling, SOB or lightheadedness with hypotension: Yes Has patient had a PCN reaction causing severe rash involving mucus membranes or skin necrosis: No Has patient had a PCN reaction that required hospitalization No Has patient had a PCN reaction occurring within the last 10 years: Yes If all of the above answers are "NO", then may proceed with Cephalosporin use.     PAST MEDICAL HISTORY: Past Medical History:  Diagnosis Date   Acute on chronic systolic CHF (congestive heart failure) (HCC) 06/14/2020   Acute respiratory failure (HCC) 05/28/2022   AICD (automatic cardioverter/defibrillator) present    2015   Allergy    Asthma    no meds   Cardiac arrest (HCC) 2015   Chest pain 05/25/2022   CHF (congestive heart failure) (HCC)    Coronary artery disease    ESRD on hemodialysis (HCC)    ckd -stage 5   Hyperlipidemia    Hypertension    Myocardial infarction (HCC)    Obesity    Pneumonia    Shortness of breath dyspnea    Sleep apnea    USES CPAP   Wears glasses      MEDICATIONS AT HOME: Prior to Admission medications   Medication Sig Start Date End Date Taking? Authorizing Provider  albuterol (PROVENTIL) (2.5 MG/3ML) 0.083% nebulizer solution INHALE THE CONTENTS OF 1 VIAL BY MOUTH EVERY 6 HOURS AS NEEDED 07/27/23  Yes [name], MD  albuterol (VENTOLIN HFA) 108 (90 Base) MCG/ACT inhaler INHALE TWO PUFFS BY MOUTH EVERY 4 HOURS AS NEEDED 04/14/23  Yes [name], MD  amiodarone (PACERONE) 200 MG tablet Take 1 tablet (200 mg total) by mouth 2 (two) times daily. 01/13/23  Yes Laurey Morale, MD  apixaban (ELIQUIS) 5 MG TABS tablet NEW PRESCRIPTION REQUEST: Eliquis 5 Mg - TAKE ONE TABLET BY MOUTH TWICE DAILY 12/01/22  Yes Laurey Morale, MD  atorvastatin (LIPITOR) 80 MG tablet Take 1 tablet (80 mg total) by mouth at bedtime. 12/01/22 12/01/23 Yes Laurey Morale, MD  budesonide-formoterol [facility]) 80-4.5 MCG/ACT inhaler INHALE TWO PUFFS BY MOUTH INTO THE LUNGS TWICE DAILY 05/26/23  Yes [name], MD  carvedilol (COREG) 12.5 MG tablet Carvedilol 12.5 Mg- TAKE ONE TABLET BY MOUTH TWICE DAILY Do not take on dialysis days Tuesday Thursday Saturdays 01/19/23  Yes [place], Anderson Malta, FNP  Cholecalciferol 125 MCG (5000 UT) TABS Take 1 tablet (5,000 Units total) by mouth daily. 12/01/22  Yes [name], MD  docusate sodium (COLACE) 100 MG capsule TAKE ONE CAPSULE BY MOUTH DAILY 07/27/23  Yes [name], MD  EPINEPHrine 0.3 mg/0.3 mL IJ SOAJ injection Inject 0.3 mg into the muscle once as needed for up to 2 doses (if worsening tongue swelling, SOB, hypoxia, or other concerns for progressive anaphylaxis). 12/01/22  Yes [name], MD  ethyl chloride spray SPRAY A SMALL AMOUNT THREE TIMES A WEEK JUST PRIOR TO NEEDLE INSERTION 06/27/23  Yes [name], MD  fluticasone [facility]) 50 MCG/ACT nasal spray INSTILL 1 SPRAY IN EACH NOSTRIL DAILY AS NEEDED 07/27/23  Yes [name], MD  hydrOXYzine (ATARAX) 25 MG tablet  Take 3 tablets (75 mg total) by mouth at bedtime. 06/27/23  Yes [name], MD  lanthanum (FOSRENOL) 1000 MG chewable tablet Chew 2 tablets (2,000 mg total) by mouth 3 (three) times daily AND 1 tablet (1,000 mg total) 2 (two) times daily as needed with snacks 06/14/23  Yes Anthony Sar, MD  linaclotide [facility]) 72 MCG capsule TAKE 1 CAPSULE (72 MCG TOTAL) BY MOUTH EVERY MONDAY, WEDNESDAY, FRIDAY, AND SATURDAY. 03/10/23  Yes [name], MD  mexiletine (MEXITIL) 150 MG capsule NEW PRESCRIPTION REQUEST: Mexiletine 150 Mg- TAKE TWO CAPSULES BY MOUTH TWICE DAILY 12/15/22  Yes Laurey Morale, MD  pantoprazole (PROTONIX) 40 MG tablet NEW PRESCRIPTION REQUEST: Pantoprazole Sod Dr 40 Mg - TAKE ONE TABLET BY MOUTH DAILY 12/01/22  Yes Laurey Morale, MD  PEG-KCl-NaCl-NaSulf-Na Asc-C (PLENVU) 140 g SOLR Take 1 kit by mouth as directed. 02/15/23  Yes Armbruster, Willaim Rayas, MD  Polyethylene Glycol 3350 (PEG 3350) 17 g PACK NEW PRESCRIPTION REQUEST: Polyethylene Glycol 3350- 1 PACKET BY MOUTH DAILY AS NEEDED 12/01/22  Yes [name], MD  predniSONE (DELTASONE) 10 MG tablet TAKE ONE TABLET BY MOUTH DAILY WITH BREAKFAST 07/19/23  Yes Laurey Morale, MD  Rectal Protectant-Emollient (CALMOL-4) 76-10 % SUPP Use as directed once to twice daily 02/15/23  Yes Armbruster, Willaim Rayas, MD  sildenafil (VIAGRA) 50 MG tablet Take 50 mg by mouth daily as needed. 09/06/22  Yes [provider]  tadalafil (CIALIS) 10 MG tablet Take 1 tablet (10 mg total) by mouth daily as needed 30 min prior to sexual activity.  Do not take with Imdur (isosorbide) or nitroglycerin 02/22/23  Yes   VELPHORO 500 MG chewable tablet Chew 1,000 mg by mouth 3 (three) times daily. 08/06/22   [provider]    ROS: Neg HEENT Neg resp Neg cardiac Neg GI Neg GU Neg MS Neg psych Neg neuro  Objective:   Vitals:   07/27/23 1105  BP: 105/77  Pulse: 71  SpO2: 99%  Weight: 255 lb 12.8 oz (116 kg)  Height: 5\' 7"   (1.702 m)    Data Review Lab Results  Component Value Date   HGBA1C 6.1 (H) 02/21/2023   HGBA1C 5.90 12/22/2015    Assessment & Plan   1. Chronic combined systolic and diastolic CHF (congestive heart failure) (HCC)  2. ESRD on dialysis (HCC)  3. Essential hypertension    Return if symptoms worsen or fail to improve.  The patient was given clear instructions to go to ER or return to medical center if symptoms don't improve, worsen or new problems develop. The patient verbalized understanding. The patient was told to call to get lab results if they haven't heard anything in the next week.      [place], PA-C [facility]  [facility] [place], [place] [phone]   08/10/2023, 3:30 PM

## 2023-08-11 DIAGNOSIS — D509 Iron deficiency anemia, unspecified: Secondary | ICD-10-CM | POA: Diagnosis not present

## 2023-08-11 DIAGNOSIS — T782XXA Anaphylactic shock, unspecified, initial encounter: Secondary | ICD-10-CM | POA: Diagnosis not present

## 2023-08-11 DIAGNOSIS — N2581 Secondary hyperparathyroidism of renal origin: Secondary | ICD-10-CM | POA: Diagnosis not present

## 2023-08-11 DIAGNOSIS — L299 Pruritus, unspecified: Secondary | ICD-10-CM | POA: Diagnosis not present

## 2023-08-11 DIAGNOSIS — N186 End stage renal disease: Secondary | ICD-10-CM | POA: Diagnosis not present

## 2023-08-11 DIAGNOSIS — D631 Anemia in chronic kidney disease: Secondary | ICD-10-CM | POA: Diagnosis not present

## 2023-08-11 DIAGNOSIS — Z992 Dependence on renal dialysis: Secondary | ICD-10-CM | POA: Diagnosis not present

## 2023-08-11 DIAGNOSIS — T7840XA Allergy, unspecified, initial encounter: Secondary | ICD-10-CM | POA: Diagnosis not present

## 2023-08-13 DIAGNOSIS — Z992 Dependence on renal dialysis: Secondary | ICD-10-CM | POA: Diagnosis not present

## 2023-08-13 DIAGNOSIS — L299 Pruritus, unspecified: Secondary | ICD-10-CM | POA: Diagnosis not present

## 2023-08-13 DIAGNOSIS — N2581 Secondary hyperparathyroidism of renal origin: Secondary | ICD-10-CM | POA: Diagnosis not present

## 2023-08-13 DIAGNOSIS — N186 End stage renal disease: Secondary | ICD-10-CM | POA: Diagnosis not present

## 2023-08-13 DIAGNOSIS — D509 Iron deficiency anemia, unspecified: Secondary | ICD-10-CM | POA: Diagnosis not present

## 2023-08-13 DIAGNOSIS — D631 Anemia in chronic kidney disease: Secondary | ICD-10-CM | POA: Diagnosis not present

## 2023-08-13 DIAGNOSIS — T7840XA Allergy, unspecified, initial encounter: Secondary | ICD-10-CM | POA: Diagnosis not present

## 2023-08-13 DIAGNOSIS — T782XXA Anaphylactic shock, unspecified, initial encounter: Secondary | ICD-10-CM | POA: Diagnosis not present

## 2023-08-16 ENCOUNTER — Other Ambulatory Visit (HOSPITAL_COMMUNITY): Payer: Self-pay | Admitting: Cardiology

## 2023-08-16 DIAGNOSIS — D631 Anemia in chronic kidney disease: Secondary | ICD-10-CM | POA: Diagnosis not present

## 2023-08-16 DIAGNOSIS — T782XXA Anaphylactic shock, unspecified, initial encounter: Secondary | ICD-10-CM | POA: Diagnosis not present

## 2023-08-16 DIAGNOSIS — N2581 Secondary hyperparathyroidism of renal origin: Secondary | ICD-10-CM | POA: Diagnosis not present

## 2023-08-16 DIAGNOSIS — N186 End stage renal disease: Secondary | ICD-10-CM | POA: Diagnosis not present

## 2023-08-16 DIAGNOSIS — L299 Pruritus, unspecified: Secondary | ICD-10-CM | POA: Diagnosis not present

## 2023-08-16 DIAGNOSIS — D509 Iron deficiency anemia, unspecified: Secondary | ICD-10-CM | POA: Diagnosis not present

## 2023-08-16 DIAGNOSIS — Z992 Dependence on renal dialysis: Secondary | ICD-10-CM | POA: Diagnosis not present

## 2023-08-16 DIAGNOSIS — T7840XA Allergy, unspecified, initial encounter: Secondary | ICD-10-CM | POA: Diagnosis not present

## 2023-08-18 ENCOUNTER — Other Ambulatory Visit: Payer: Self-pay | Admitting: Internal Medicine

## 2023-08-18 ENCOUNTER — Other Ambulatory Visit: Payer: Self-pay | Admitting: Cardiology

## 2023-08-18 DIAGNOSIS — N186 End stage renal disease: Secondary | ICD-10-CM | POA: Diagnosis not present

## 2023-08-18 DIAGNOSIS — D509 Iron deficiency anemia, unspecified: Secondary | ICD-10-CM | POA: Diagnosis not present

## 2023-08-18 DIAGNOSIS — T7840XA Allergy, unspecified, initial encounter: Secondary | ICD-10-CM | POA: Diagnosis not present

## 2023-08-18 DIAGNOSIS — Z992 Dependence on renal dialysis: Secondary | ICD-10-CM | POA: Diagnosis not present

## 2023-08-18 DIAGNOSIS — N2581 Secondary hyperparathyroidism of renal origin: Secondary | ICD-10-CM | POA: Diagnosis not present

## 2023-08-18 DIAGNOSIS — D631 Anemia in chronic kidney disease: Secondary | ICD-10-CM | POA: Diagnosis not present

## 2023-08-18 DIAGNOSIS — T782XXA Anaphylactic shock, unspecified, initial encounter: Secondary | ICD-10-CM | POA: Diagnosis not present

## 2023-08-18 DIAGNOSIS — L299 Pruritus, unspecified: Secondary | ICD-10-CM | POA: Diagnosis not present

## 2023-08-20 DIAGNOSIS — N2581 Secondary hyperparathyroidism of renal origin: Secondary | ICD-10-CM | POA: Diagnosis not present

## 2023-08-20 DIAGNOSIS — L299 Pruritus, unspecified: Secondary | ICD-10-CM | POA: Diagnosis not present

## 2023-08-20 DIAGNOSIS — D631 Anemia in chronic kidney disease: Secondary | ICD-10-CM | POA: Diagnosis not present

## 2023-08-20 DIAGNOSIS — T782XXA Anaphylactic shock, unspecified, initial encounter: Secondary | ICD-10-CM | POA: Diagnosis not present

## 2023-08-20 DIAGNOSIS — N186 End stage renal disease: Secondary | ICD-10-CM | POA: Diagnosis not present

## 2023-08-20 DIAGNOSIS — T7840XA Allergy, unspecified, initial encounter: Secondary | ICD-10-CM | POA: Diagnosis not present

## 2023-08-20 DIAGNOSIS — Z992 Dependence on renal dialysis: Secondary | ICD-10-CM | POA: Diagnosis not present

## 2023-08-20 DIAGNOSIS — D509 Iron deficiency anemia, unspecified: Secondary | ICD-10-CM | POA: Diagnosis not present

## 2023-08-22 ENCOUNTER — Ambulatory Visit: Payer: Self-pay

## 2023-08-22 NOTE — Patient Outreach (Signed)
Care Coordination   Follow Up Visit Note   08/22/2023 Name: Victor Thompson MRN: [id number] DOB: May 04, 1961  Victor Thompson is a 62 y.o. year old male who sees [name], MD for primary care. I spoke with  Victor Thompson by phone today.  What matters to the patients health and wellness today?  Patient would like to have his vision rechecked. He may consider speaking with a [facility] CM regarding his Cardiac PET scan.     Goals Addressed               This Visit's Progress     Patient Stated     I want my breathing and chest soreness to get better (pt-stated)   On track     Care Coordination Interventions: Evaluation of current treatment plan related to paroxsymal ventricular tachycardia and patient's adherence to plan as established by provider Determined patient completed a recent follow up with Cardiology Review of patient status, including review of consultant's reports, relevant laboratory and other test results, and medications completed Educated patient on how to request speaking with a case manager through [facility] to request additional assistance with the CARDIAC PET SARCOID, patient verbalizes understanding and will consider  Reviewed next scheduled follow up appointment with Dr. Shirlee Latch set for 10/25/23 @11 :00 AM      Other     To have my vision evaluated        Care Coordination Interventions: Evaluation of current treatment plan related to change in vision and patient's adherence to plan as established by provider Discussed with patient he feels his vision has changed, he completed an eye exam earlier in the year and does not recall having been advised of having any abnormal findings during that time, he is concerned Amiodarone is effecting his vision  Encouraged patient to contact his eye doctor to have his vision re-evaluated, discussed if his eye exam is normal, he will further discuss his concerns with Dr. Shirlee Latch at next scheduled visit    Interventions Today     Flowsheet Row Most Recent Value  Chronic Disease   Chronic disease during today's visit Diabetes, Other, Chronic Kidney Disease/End Stage Renal Disease (ESRD), Congestive Heart Failure (CHF)  Victor Thompson in vision]  General Interventions   General Interventions Discussed/Reviewed General Interventions Discussed, General Interventions Reviewed, Doctor Visits, Annual Eye Exam  Doctor Visits Discussed/Reviewed Doctor Visits Discussed, PCP, Doctor Visits Reviewed, Specialist  Education Interventions   Education Provided Provided Education  Provided Verbal Education On When to see the doctor, Medication, Eye Care  Pharmacy Interventions   Pharmacy Dicussed/Reviewed Pharmacy Topics Discussed, Pharmacy Topics Reviewed, Medications and their functions          SDOH assessments and interventions completed:  No     Care Coordination Interventions:  Yes, provided   Follow up plan: Follow up call scheduled for 11/02/23 @1 :30 PM    Encounter Outcome:  Patient Visit Completed

## 2023-08-22 NOTE — Patient Instructions (Signed)
Visit Information  Thank you for taking time to visit with me today. Please don't hesitate to contact me if I can be of assistance to you.   Following are the goals we discussed today:   Goals Addressed               This Visit's Progress     Patient Stated     I want my breathing and chest soreness to get better (pt-stated)   On track     Care Coordination Interventions: Evaluation of current treatment plan related to paroxsymal ventricular tachycardia and patient's adherence to plan as established by provider Determined patient completed a recent follow up with Cardiology Review of patient status, including review of consultant's reports, relevant laboratory and other test results, and medications completed Educated patient on how to request speaking with a case manager through [facility] to request additional assistance with the CARDIAC PET SARCOID, patient verbalizes understanding and will consider  Reviewed next scheduled follow up appointment with Dr. Shirlee Latch set for 10/25/23 @11 :00 AM      Other     To have my vision evaluated        Care Coordination Interventions: Evaluation of current treatment plan related to change in vision and patient's adherence to plan as established by provider Discussed with patient he feels his vision has changed, he completed an eye exam earlier in the year and does not recall having been advised of having any abnormal findings during that time, he is concerned Amiodarone is effecting his vision  Encouraged patient to contact his eye doctor to have his vision re-evaluated, discussed if his eye exam is normal, he will further discuss his concerns with Dr. Shirlee Latch at next scheduled visit        Our next appointment is by telephone on 11/02/23 at 1:30 PM  Please call the care guide team at [phone] if you need to cancel or reschedule your appointment.   If you are experiencing a Mental Health or Behavioral Health Crisis or need someone to talk to,  please call 1-800-273-TALK (toll free, 24 hour hotline)  Patient verbalizes understanding of instructions and care plan provided today and agrees to view in MyChart. Active MyChart status and patient understanding of how to access instructions and care plan via MyChart confirmed with patient.     Delsa Sale RN BSN CCM [place]  [facility], [facility] Health Nurse Care Coordinator  Direct Dial: [phone] Website: [email or web address]

## 2023-08-23 ENCOUNTER — Other Ambulatory Visit: Payer: Self-pay | Admitting: Internal Medicine

## 2023-08-23 ENCOUNTER — Ambulatory Visit (INDEPENDENT_AMBULATORY_CARE_PROVIDER_SITE_OTHER): Payer: 59 | Admitting: Podiatry

## 2023-08-23 ENCOUNTER — Encounter: Payer: Self-pay | Admitting: Podiatry

## 2023-08-23 DIAGNOSIS — M79675 Pain in left toe(s): Secondary | ICD-10-CM

## 2023-08-23 DIAGNOSIS — M79674 Pain in right toe(s): Secondary | ICD-10-CM

## 2023-08-23 DIAGNOSIS — N2581 Secondary hyperparathyroidism of renal origin: Secondary | ICD-10-CM | POA: Diagnosis not present

## 2023-08-23 DIAGNOSIS — D631 Anemia in chronic kidney disease: Secondary | ICD-10-CM | POA: Diagnosis not present

## 2023-08-23 DIAGNOSIS — L299 Pruritus, unspecified: Secondary | ICD-10-CM | POA: Diagnosis not present

## 2023-08-23 DIAGNOSIS — Z992 Dependence on renal dialysis: Secondary | ICD-10-CM | POA: Diagnosis not present

## 2023-08-23 DIAGNOSIS — T782XXA Anaphylactic shock, unspecified, initial encounter: Secondary | ICD-10-CM | POA: Diagnosis not present

## 2023-08-23 DIAGNOSIS — N186 End stage renal disease: Secondary | ICD-10-CM | POA: Diagnosis not present

## 2023-08-23 DIAGNOSIS — B351 Tinea unguium: Secondary | ICD-10-CM | POA: Diagnosis not present

## 2023-08-23 DIAGNOSIS — T7840XA Allergy, unspecified, initial encounter: Secondary | ICD-10-CM | POA: Diagnosis not present

## 2023-08-23 DIAGNOSIS — G4733 Obstructive sleep apnea (adult) (pediatric): Secondary | ICD-10-CM | POA: Diagnosis not present

## 2023-08-23 DIAGNOSIS — D509 Iron deficiency anemia, unspecified: Secondary | ICD-10-CM | POA: Diagnosis not present

## 2023-08-24 ENCOUNTER — Other Ambulatory Visit: Payer: Self-pay

## 2023-08-24 NOTE — Progress Notes (Signed)
He presents today chief complaint of painfully thick elongated toenails.  Objective: Vital signs are stable he is alert oriented x 3.  Pulses are palpable.  Toenails are thick yellow dystrophic clinical mycotic painful on palpation.  Assessment: Pain limb secondary onychomycosis.  Plan: Debrided painful dystrophic possibly mycotic nails 1 through 5 bilaterally.  I will follow-up with him on as-needed basis.

## 2023-08-24 NOTE — Telephone Encounter (Signed)
Requested Prescriptions  Pending Prescriptions Disp Refills   fluticasone (FLONASE) 50 MCG/ACT nasal spray [Pharmacy Med Name: FLUTICASONE NASAL 16 50 SUS] 15 g 2    Sig: INSTILL 1 SPRAY IN EACH NOTRIL DAILY AS NEEDED     Ear, Nose, and Throat: Nasal Preparations - Corticosteroids Passed - 08/23/2023 10:00 AM      Passed - Valid encounter within last 12 months    Recent Outpatient Visits           4 weeks ago Chronic combined systolic and diastolic CHF (congestive heart failure) [facility])   [place] [facility] [place], [place], [place]   1 month ago Chronic combined systolic and diastolic CHF (congestive heart failure) [facility])   [place] [facility] Jonah Blue B, MD   6 months ago Chronic combined systolic and diastolic CHF (congestive heart failure) [facility])   [place] [facility] Jonah Blue B, MD   10 months ago Chronic systolic congestive heart failure, NYHA class 2 [facility])   [place] [facility] [name], MD   11 months ago Hospital discharge follow-up   [facility] [name], MD       Future Appointments             In 2 months [name], MD [facility]

## 2023-08-25 DIAGNOSIS — T782XXA Anaphylactic shock, unspecified, initial encounter: Secondary | ICD-10-CM | POA: Diagnosis not present

## 2023-08-25 DIAGNOSIS — N2581 Secondary hyperparathyroidism of renal origin: Secondary | ICD-10-CM | POA: Diagnosis not present

## 2023-08-25 DIAGNOSIS — Z992 Dependence on renal dialysis: Secondary | ICD-10-CM | POA: Diagnosis not present

## 2023-08-25 DIAGNOSIS — D509 Iron deficiency anemia, unspecified: Secondary | ICD-10-CM | POA: Diagnosis not present

## 2023-08-25 DIAGNOSIS — T7840XA Allergy, unspecified, initial encounter: Secondary | ICD-10-CM | POA: Diagnosis not present

## 2023-08-25 DIAGNOSIS — N186 End stage renal disease: Secondary | ICD-10-CM | POA: Diagnosis not present

## 2023-08-25 DIAGNOSIS — D631 Anemia in chronic kidney disease: Secondary | ICD-10-CM | POA: Diagnosis not present

## 2023-08-25 DIAGNOSIS — L299 Pruritus, unspecified: Secondary | ICD-10-CM | POA: Diagnosis not present

## 2023-08-27 DIAGNOSIS — Z992 Dependence on renal dialysis: Secondary | ICD-10-CM | POA: Diagnosis not present

## 2023-08-27 DIAGNOSIS — N2581 Secondary hyperparathyroidism of renal origin: Secondary | ICD-10-CM | POA: Diagnosis not present

## 2023-08-27 DIAGNOSIS — T7840XA Allergy, unspecified, initial encounter: Secondary | ICD-10-CM | POA: Diagnosis not present

## 2023-08-27 DIAGNOSIS — N186 End stage renal disease: Secondary | ICD-10-CM | POA: Diagnosis not present

## 2023-08-27 DIAGNOSIS — L299 Pruritus, unspecified: Secondary | ICD-10-CM | POA: Diagnosis not present

## 2023-08-27 DIAGNOSIS — D631 Anemia in chronic kidney disease: Secondary | ICD-10-CM | POA: Diagnosis not present

## 2023-08-27 DIAGNOSIS — T782XXA Anaphylactic shock, unspecified, initial encounter: Secondary | ICD-10-CM | POA: Diagnosis not present

## 2023-08-27 DIAGNOSIS — D509 Iron deficiency anemia, unspecified: Secondary | ICD-10-CM | POA: Diagnosis not present

## 2023-08-30 DIAGNOSIS — T7840XA Allergy, unspecified, initial encounter: Secondary | ICD-10-CM | POA: Diagnosis not present

## 2023-08-30 DIAGNOSIS — D631 Anemia in chronic kidney disease: Secondary | ICD-10-CM | POA: Diagnosis not present

## 2023-08-30 DIAGNOSIS — N2581 Secondary hyperparathyroidism of renal origin: Secondary | ICD-10-CM | POA: Diagnosis not present

## 2023-08-30 DIAGNOSIS — L299 Pruritus, unspecified: Secondary | ICD-10-CM | POA: Diagnosis not present

## 2023-08-30 DIAGNOSIS — Z992 Dependence on renal dialysis: Secondary | ICD-10-CM | POA: Diagnosis not present

## 2023-08-30 DIAGNOSIS — N186 End stage renal disease: Secondary | ICD-10-CM | POA: Diagnosis not present

## 2023-08-30 DIAGNOSIS — T782XXA Anaphylactic shock, unspecified, initial encounter: Secondary | ICD-10-CM | POA: Diagnosis not present

## 2023-08-30 DIAGNOSIS — D509 Iron deficiency anemia, unspecified: Secondary | ICD-10-CM | POA: Diagnosis not present

## 2023-08-31 DIAGNOSIS — H18053 Posterior corneal pigmentations, bilateral: Secondary | ICD-10-CM | POA: Diagnosis not present

## 2023-09-01 DIAGNOSIS — T7840XA Allergy, unspecified, initial encounter: Secondary | ICD-10-CM | POA: Diagnosis not present

## 2023-09-01 DIAGNOSIS — D509 Iron deficiency anemia, unspecified: Secondary | ICD-10-CM | POA: Diagnosis not present

## 2023-09-01 DIAGNOSIS — L299 Pruritus, unspecified: Secondary | ICD-10-CM | POA: Diagnosis not present

## 2023-09-01 DIAGNOSIS — N2581 Secondary hyperparathyroidism of renal origin: Secondary | ICD-10-CM | POA: Diagnosis not present

## 2023-09-01 DIAGNOSIS — Z992 Dependence on renal dialysis: Secondary | ICD-10-CM | POA: Diagnosis not present

## 2023-09-01 DIAGNOSIS — D631 Anemia in chronic kidney disease: Secondary | ICD-10-CM | POA: Diagnosis not present

## 2023-09-01 DIAGNOSIS — T782XXA Anaphylactic shock, unspecified, initial encounter: Secondary | ICD-10-CM | POA: Diagnosis not present

## 2023-09-01 DIAGNOSIS — N186 End stage renal disease: Secondary | ICD-10-CM | POA: Diagnosis not present

## 2023-09-02 ENCOUNTER — Ambulatory Visit (INDEPENDENT_AMBULATORY_CARE_PROVIDER_SITE_OTHER): Payer: 59 | Admitting: Gastroenterology

## 2023-09-02 ENCOUNTER — Encounter: Payer: Self-pay | Admitting: Gastroenterology

## 2023-09-02 VITALS — BP 100/62 | HR 71 | Ht 67.0 in | Wt 254.0 lb

## 2023-09-02 DIAGNOSIS — Z992 Dependence on renal dialysis: Secondary | ICD-10-CM | POA: Diagnosis not present

## 2023-09-02 DIAGNOSIS — I499 Cardiac arrhythmia, unspecified: Secondary | ICD-10-CM

## 2023-09-02 DIAGNOSIS — Z86010 Personal history of colonic polyps: Secondary | ICD-10-CM | POA: Diagnosis not present

## 2023-09-02 DIAGNOSIS — N186 End stage renal disease: Secondary | ICD-10-CM | POA: Diagnosis not present

## 2023-09-02 DIAGNOSIS — K649 Unspecified hemorrhoids: Secondary | ICD-10-CM

## 2023-09-02 DIAGNOSIS — I5042 Chronic combined systolic (congestive) and diastolic (congestive) heart failure: Secondary | ICD-10-CM | POA: Diagnosis not present

## 2023-09-02 DIAGNOSIS — Z7901 Long term (current) use of anticoagulants: Secondary | ICD-10-CM

## 2023-09-02 DIAGNOSIS — K5900 Constipation, unspecified: Secondary | ICD-10-CM | POA: Diagnosis not present

## 2023-09-02 DIAGNOSIS — I871 Compression of vein: Secondary | ICD-10-CM | POA: Diagnosis not present

## 2023-09-02 DIAGNOSIS — K625 Hemorrhage of anus and rectum: Secondary | ICD-10-CM

## 2023-09-02 NOTE — H&P (View-Only) (Signed)
HPI :  62 y/o male with a history of significant heart disease, dialysis-dependent renal failure, and constipation, presents for a discussion regarding a pending colonoscopy.   He was scheduled for his colonoscopy in September 2023 at the hospital in light of his end-stage renal disease and cardiomyopathy.  Unfortunately prior to that procedure, he was admitted to the hospital with VT storm. Required intubation, treated with IV amiodarone. Echo showed EF 20-25%, GIIIDD (restrictive), RV normal. R/LHC showed nonobstructive CAD and elevated right and left filling pressures. Underwent CRRT for volume removal. EP consulted and started on procainamide. Started on prednisone for suspected sarcoid. Mexiletine later added for VT suppression. ICD deactivated.  Hospitalization c/b PNA and paroxysmal atrial fibrillation.  He was supposed to have cardiac PET at [facility] for cardiac sarcoidosis work up however this has not been done yet as his insurance has not covered it.  He was scheduled at [facility] for colonoscopy this past April after I last saw him in the office in March to coordinate this.  Unfortunately he had to cancel his colonoscopy since it conflicted with his scheduled dialysis.  He is bit frustrated that his exam has not been done yet.  He has been followed by cardiology, he has maintained on low-dose prednisone for suspected cardiac sarcoid, he has been trying to get a PET scan done for his heart which has not been covered by his insurance.  That being said he has not been in the hospital or admitted with any issues since of last seen him.  He denies any palpitations or chest pains.  He does have occasional dyspnea on days when he is getting his dialysis.  He has dialysis Tuesday, Thursday, and Saturday.  The patient reports that the Linzess and Miralax prescribed for constipation are effective, resulting in bowel movements once or twice daily. The patient also reports occasional rectal  bleeding, which he attributes to straining and suspects is from hemorrhoids. The bleeding is noted primarily on toilet paper. He has had a limited DRE in the last office visit due to discomfort.   The patient expresses a desire to proceed with the colonoscopy and feels he is in a stable enough condition to tolerate the procedure. However, scheduling the procedure is complicated by the patient's dialysis schedule and the need for the procedure to be performed in a hospital setting due to his heart condition.  Specifically his case needs to be done at [facility] given his cardiac issues.    Prior exams: Colonoscopy 02/01/2019: One 6 mm polyp in the cecum, removed with a cold snare. Resected and retrieved. - Three 4 to 5 mm polyps in the ascending colon, removed with a cold snare. Resected and retrieved. - One 3 mm polyp in the sigmoid colon, removed with a cold snare. Resected and retrieved. - Polypoid lesion at the anus. Biopsied. - Internal hemorrhoids. - The examination was otherwise normal.   1. Surgical [P], colon, ascending and cecum, sigmoid, polyp (5) - TUBULAR ADENOMA(S) - NEGATIVE FOR HIGH-GRADE DYSPLASIA OR MALIGNANCY 2. Surgical [P], anal canal polyp - ANAL SQUAMOUS MUCOSA WITH REACTIVE CHANGES AND HYPERKERATOSIS - NO EVIDENCE OF MALIGNANCY     Echo 10/21/21: EF 20-30%, grade I DD   Admitted 9/23 with VT storm.  Echo showed EF 20-25%, GIIIDD (restrictive), RV normal. R/LHC showed nonobstructive CAD and elevated right and left filling pressures.   Past Medical History:  Diagnosis Date   Acute on chronic systolic CHF (congestive heart failure) (HCC)  06/14/2020   Acute respiratory failure (HCC) 05/28/2022   AICD (automatic cardioverter/defibrillator) present    2015   Allergy    Asthma    no meds   Cardiac arrest (HCC) 2015   Chest pain 05/25/2022   CHF (congestive heart failure) (HCC)    Coronary artery disease    ESRD on hemodialysis (HCC)    ckd -stage 5    Hyperlipidemia    Hypertension    Myocardial infarction (HCC)    Obesity    Pneumonia    Shortness of breath dyspnea    Sleep apnea    USES CPAP   Wears glasses      Past Surgical History:  Procedure Laterality Date   AV FISTULA PLACEMENT Right 03/15/2019   Procedure: RIGHT ARM ARTERIOVENOUS (AV) FISTULA CREATION;  Surgeon: Chuck Hint, MD;  Location[facility] OR;  Service: Vascular;  Laterality: Right;   COLONOSCOPY     COLONOSCOPY W/ BIOPSIES AND POLYPECTOMY     CORONARY STENT PLACEMENT  2007   IMPLANTABLE CARDIOVERTER DEFIBRILLATOR IMPLANT  2015   RIGHT/LEFT HEART CATH AND CORONARY ANGIOGRAPHY N/A 07/08/2021   Procedure: RIGHT/LEFT HEART CATH AND CORONARY ANGIOGRAPHY;  Surgeon: Laurey Morale, MD;  Location: [facility] INVASIVE CV LAB;  Service: Cardiovascular;  Laterality: N/A;   RIGHT/LEFT HEART CATH AND CORONARY ANGIOGRAPHY N/A 08/25/2022   Procedure: RIGHT/LEFT HEART CATH AND CORONARY ANGIOGRAPHY;  Surgeon: Laurey Morale, MD;  Location: [facility] INVASIVE CV LAB;  Service: Cardiovascular;  Laterality: N/A;   SUBQ ICD CHANGEOUT N/A 10/13/2020   Procedure: SUBQ ICD CHANGEOUT;  Surgeon: Marinus Maw, MD;  Location: [facility] INVASIVE CV LAB;  Service: Cardiovascular;  Laterality: N/A;   Family History  Problem Relation Age of Onset   Hypertension Mother    Liver disease Father    Colon cancer Neg Hx    Esophageal cancer Neg Hx    Rectal cancer Neg Hx    Stomach cancer Neg Hx    Social History   Tobacco Use   Smoking status: Never   Smokeless tobacco: Never  Vaping Use   Vaping status: Never Used  Substance Use Topics   Alcohol use: No   Drug use: No   Current Outpatient Medications  Medication Sig Dispense Refill   albuterol (PROVENTIL) (2.5 MG/3ML) 0.083% nebulizer solution INHALE THE CONTENTS OF 1 VIAL BY MOUTH EVERY 6 HOURS AS NEEDED 675 mL 1   albuterol (VENTOLIN HFA) 108 (90 Base) MCG/ACT inhaler INHALE TWO PUFFS BY MOUTH EVERY 4 HOURS AS NEEDED 60.3 g 2   amiodarone  (PACERONE) 200 MG tablet Take 1 tablet (200 mg total) by mouth 2 (two) times daily. 60 tablet 2   apixaban (ELIQUIS) 5 MG TABS tablet NEW PRESCRIPTION REQUEST: Eliquis 5 Mg - TAKE ONE TABLET BY MOUTH TWICE DAILY 180 tablet 3   atorvastatin (LIPITOR) 80 MG tablet Take 1 tablet (80 mg total) by mouth at bedtime. 90 tablet 3   budesonide-formoterol (SYMBICORT) 80-4.5 MCG/ACT inhaler INHALE TWO PUFFS BY MOUTH INTO THE LUNGS TWICE DAILY 10.2 g 0   carvedilol (COREG) 12.5 MG tablet Carvedilol 12.5 Mg- TAKE ONE TABLET BY MOUTH TWICE DAILY Do not take on dialysis days Tuesday Thursday Saturdays 180 tablet 3   Cholecalciferol 125 MCG (5000 UT) TABS Take 1 tablet (5,000 Units total) by mouth daily. 90 tablet 1   docusate sodium (COLACE) 100 MG capsule TAKE ONE CAPSULE BY MOUTH DAILY 90 capsule 1   EPINEPHrine 0.3 mg/0.3 mL IJ SOAJ injection Inject  0.3 mg into the muscle once as needed for up to 2 doses (if worsening tongue swelling, SOB, hypoxia, or other concerns for progressive anaphylaxis). 2 each 2   ethyl chloride spray SPRAY A SMALL AMOUNT THREE TIMES A WEEK JUST PRIOR TO NEEDLE INSERTION 116 mL 5   fluticasone (FLONASE) 50 MCG/ACT nasal spray INSTILL 1 SPRAY IN EACH NOTRIL DAILY AS NEEDED 15 g 2   hydrOXYzine (ATARAX) 25 MG tablet Take 3 tablets (75 mg total) by mouth at bedtime. 90 tablet 1   lanthanum (FOSRENOL) 1000 MG chewable tablet Chew 2 tablets (2,000 mg total) by mouth 3 (three) times daily AND 1 tablet (1,000 mg total) 2 (two) times daily as needed with snacks 720 tablet 3   linaclotide (LINZESS) 72 MCG capsule TAKE 1 CAPSULE (72 MCG TOTAL) BY MOUTH EVERY MONDAY, WEDNESDAY, FRIDAY, AND SATURDAY. 64 capsule 1   mexiletine (MEXITIL) 150 MG capsule NEW PRESCRIPTION REQUEST: Mexiletine 150 Mg- TAKE TWO CAPSULES BY MOUTH TWICE DAILY 360 capsule 3   pantoprazole (PROTONIX) 40 MG tablet NEW PRESCRIPTION REQUEST: Pantoprazole Sod Dr 40 Mg - TAKE ONE TABLET BY MOUTH DAILY 90 tablet 3    PEG-KCl-NaCl-NaSulf-Na Asc-C (PLENVU) 140 g SOLR Take 1 kit by mouth as directed. 3 each 0   Polyethylene Glycol 3350 (PEG 3350) 17 g PACK NEW PRESCRIPTION REQUEST: Polyethylene Glycol 3350- 1 PACKET BY MOUTH DAILY AS NEEDED 90 packet 0   predniSONE (DELTASONE) 10 MG tablet TAKE ONE TABLET BY MOUTH DAILY WITH BREAKFAST 30 tablet 0   Rectal Protectant-Emollient (CALMOL-4) 76-10 % SUPP Use as directed once to twice daily 8 suppository 0   sildenafil (VIAGRA) 50 MG tablet Take 50 mg by mouth daily as needed.     tadalafil (CIALIS) 10 MG tablet Take 1 tablet (10 mg total) by mouth daily as needed 30 min prior to sexual activity.  Do not take with Imdur (isosorbide) or nitroglycerin 30 tablet 1   VELPHORO 500 MG chewable tablet Chew 1,000 mg by mouth 3 (three) times daily.     No current facility-administered medications for this visit.   Allergies  Allergen Reactions   Ace Inhibitors Swelling    Swelling of the tongue   Influenza Vaccines Hives and Swelling    SWELLING REACTION UNSPECIFIED    Ketorolac Swelling    SWELLING REACTION UNSPECIFIED    Lidocaine Swelling    TONGUE SWELLS   Lisinopril Swelling    TONGUE SWELLING Pt reported problem with a BP med which sounded like  lisinopril  But as of 09/19/06,pt had tolerated altace without problem   Penicillins Swelling    TONGUE SWELLS Has patient had a PCN reaction causing immediate rash, facial/tongue/throat swelling, SOB or lightheadedness with hypotension: Yes Has patient had a PCN reaction causing severe rash involving mucus membranes or skin necrosis: No Has patient had a PCN reaction that required hospitalization No Has patient had a PCN reaction occurring within the last 10 years: Yes If all of the above answers are "NO", then may proceed with Cephalosporin use.      Review of Systems: All systems reviewed and negative except where noted in HPI.   Lab Results  Component Value Date   WBC 6.4 01/19/2023   HGB 14.1 01/19/2023    HCT 44.1 01/19/2023   MCV 102.6 (H) 01/19/2023   PLT 206 01/19/2023    Lab Results  Component Value Date   NA 145 10/26/2022   CL 98 10/26/2022   K 3.9 10/26/2022  CO2 27 10/26/2022   BUN 39 (H) 10/26/2022   CREATININE 10.64 (H) 10/26/2022   GFRNONAA 5 (L) 10/26/2022   CALCIUM 8.1 (L) 10/26/2022   PHOS 4.8 (H) 09/07/2022   ALBUMIN 3.2 (L) 01/19/2023   GLUCOSE 111 (H) 10/26/2022    Lab Results  Component Value Date   ALT 74 (H) 01/19/2023   AST 40 01/19/2023   ALKPHOS 72 01/19/2023   BILITOT 0.3 01/19/2023     Physical Exam: BP 100/62   Pulse 71   Ht 5\' 7"  (1.702 m)   Wt 254 lb (115.2 kg)   SpO2 94%   BMI 39.78 kg/m  Constitutional: Pleasant,well-developed, male in no acute distress. Neurological: Alert and oriented to person place and time. Psychiatric: Normal mood and affect. Behavior is normal.   ASSESSMENT: 62 y.o. male here for assessment of the following  1. History of colon polyps   2. Rectal bleeding   3. Hemorrhoids, unspecified hemorrhoid type   4. Constipation, unspecified constipation type   5. Anticoagulated   6. Cardiac arrhythmia, unspecified cardiac arrhythmia type   7. Chronic combined systolic and diastolic CHF (congestive heart failure) (HCC)   8. ESRD on dialysis [facility])    Patient is overdue for surveillance colonoscopy and ideally would like to do this for his history of colon polyps in the setting of his intermittent rectal bleeding.  Bleeding is likely due to hemorrhoids in the setting of constipation and anticoagulation, want to make sure nothing else going on.  His procedure has been been delayed for several months.  It was scheduled last September but he had significant cardiac issues as outlined above and was prolonged for several months.  He canceled the last scheduled appointment in April.  He feels stable at this point from a cardiopulmonary perspective and wants to try to get this done if at all possible.  I discussed risks of  the procedure versus risks of not doing the procedure, in light of his comorbidities he is higher than average risk for anesthesia.  After discussion of this and if he wants to proceed or not, he strongly wants to proceed with colonoscopy, understanding all risks of this.  With his arrhythmia history, CHF, end-stage renal disease, he meets criteria to be done at [facility] for cardiac support should he need it.  Logistically this may be challenging in regards to limited openings for outpatient is over there and coordination with my schedule and the patient scheduled.  We tried calling over to the hospital today to get a date and time and they could not accommodate [place] today but will call back next week to sort out logistics of this.  In the interim we will ask for approval from cardiology for clearance to proceed and clearance to hold Eliquis for 2 days prior to the exam.  He will continue Linzess and MiraLAX for now, he will need to continue this every day and increase Linzess dosing shortly before his preparation.  Further recommendations pending results and his course.  PLAN: - will try to coordinate outpatient colonoscopy at [facility] hospital, to be coordinated around his dialysis schedule - he will need clearance to proceed from cardiology and approval to hold Eliquis for 2 days prior to the exam - continue Linzess and Miralax for now  Harlin Rain, MD [facility] Gastroenterology

## 2023-09-02 NOTE — Progress Notes (Signed)
HPI :  62 y/o male with a history of significant heart disease, dialysis-dependent renal failure, and constipation, presents for a discussion regarding a pending colonoscopy.   He was scheduled for his colonoscopy in September 2023 at the hospital in light of his end-stage renal disease and cardiomyopathy.  Unfortunately prior to that procedure, he was admitted to the hospital with VT storm. Required intubation, treated with IV amiodarone. Echo showed EF 20-25%, GIIIDD (restrictive), RV normal. R/LHC showed nonobstructive CAD and elevated right and left filling pressures. Underwent CRRT for volume removal. EP consulted and started on procainamide. Started on prednisone for suspected sarcoid. Mexiletine later added for VT suppression. ICD deactivated.  Hospitalization c/b PNA and paroxysmal atrial fibrillation.  He was supposed to have cardiac PET at [facility] for cardiac sarcoidosis work up however this has not been done yet as his insurance has not covered it.  He was scheduled at [facility] for colonoscopy this past April after I last saw him in the office in March to coordinate this.  Unfortunately he had to cancel his colonoscopy since it conflicted with his scheduled dialysis.  He is bit frustrated that his exam has not been done yet.  He has been followed by cardiology, he has maintained on low-dose prednisone for suspected cardiac sarcoid, he has been trying to get a PET scan done for his heart which has not been covered by his insurance.  That being said he has not been in the hospital or admitted with any issues since of last seen him.  He denies any palpitations or chest pains.  He does have occasional dyspnea on days when he is getting his dialysis.  He has dialysis Tuesday, Thursday, and Saturday.  The patient reports that the Linzess and Miralax prescribed for constipation are effective, resulting in bowel movements once or twice daily. The patient also reports occasional rectal  bleeding, which he attributes to straining and suspects is from hemorrhoids. The bleeding is noted primarily on toilet paper. He has had a limited DRE in the last office visit due to discomfort.   The patient expresses a desire to proceed with the colonoscopy and feels he is in a stable enough condition to tolerate the procedure. However, scheduling the procedure is complicated by the patient's dialysis schedule and the need for the procedure to be performed in a hospital setting due to his heart condition.  Specifically his case needs to be done at [facility] given his cardiac issues.    Prior exams: Colonoscopy 02/01/2019: One 6 mm polyp in the cecum, removed with a cold snare. Resected and retrieved. - Three 4 to 5 mm polyps in the ascending colon, removed with a cold snare. Resected and retrieved. - One 3 mm polyp in the sigmoid colon, removed with a cold snare. Resected and retrieved. - Polypoid lesion at the anus. Biopsied. - Internal hemorrhoids. - The examination was otherwise normal.   1. Surgical [P], colon, ascending and cecum, sigmoid, polyp (5) - TUBULAR ADENOMA(S) - NEGATIVE FOR HIGH-GRADE DYSPLASIA OR MALIGNANCY 2. Surgical [P], anal canal polyp - ANAL SQUAMOUS MUCOSA WITH REACTIVE CHANGES AND HYPERKERATOSIS - NO EVIDENCE OF MALIGNANCY     Echo 10/21/21: EF 20-30%, grade I DD   Admitted 9/23 with VT storm.  Echo showed EF 20-25%, GIIIDD (restrictive), RV normal. R/LHC showed nonobstructive CAD and elevated right and left filling pressures.   Past Medical History:  Diagnosis Date   Acute on chronic systolic CHF (congestive heart failure) (HCC)  06/14/2020   Acute respiratory failure (HCC) 05/28/2022   AICD (automatic cardioverter/defibrillator) present    2015   Allergy    Asthma    no meds   Cardiac arrest (HCC) 2015   Chest pain 05/25/2022   CHF (congestive heart failure) (HCC)    Coronary artery disease    ESRD on hemodialysis (HCC)    ckd -stage 5    Hyperlipidemia    Hypertension    Myocardial infarction (HCC)    Obesity    Pneumonia    Shortness of breath dyspnea    Sleep apnea    USES CPAP   Wears glasses      Past Surgical History:  Procedure Laterality Date   AV FISTULA PLACEMENT Right 03/15/2019   Procedure: RIGHT ARM ARTERIOVENOUS (AV) FISTULA CREATION;  Surgeon: Chuck Hint, MD;  Location: [facility] OR;  Service: Vascular;  Laterality: Right;   COLONOSCOPY     COLONOSCOPY W/ BIOPSIES AND POLYPECTOMY     CORONARY STENT PLACEMENT  2007   IMPLANTABLE CARDIOVERTER DEFIBRILLATOR IMPLANT  2015   RIGHT/LEFT HEART CATH AND CORONARY ANGIOGRAPHY N/A 07/08/2021   Procedure: RIGHT/LEFT HEART CATH AND CORONARY ANGIOGRAPHY;  Surgeon: Laurey Morale, MD;  Location: [facility] INVASIVE CV LAB;  Service: Cardiovascular;  Laterality: N/A;   RIGHT/LEFT HEART CATH AND CORONARY ANGIOGRAPHY N/A 08/25/2022   Procedure: RIGHT/LEFT HEART CATH AND CORONARY ANGIOGRAPHY;  Surgeon: Laurey Morale, MD;  Location: [facility] INVASIVE CV LAB;  Service: Cardiovascular;  Laterality: N/A;   SUBQ ICD CHANGEOUT N/A 10/13/2020   Procedure: SUBQ ICD CHANGEOUT;  Surgeon: Marinus Maw, MD;  Location: [facility] INVASIVE CV LAB;  Service: Cardiovascular;  Laterality: N/A;   Family History  Problem Relation Age of Onset   Hypertension Mother    Liver disease Father    Colon cancer Neg Hx    Esophageal cancer Neg Hx    Rectal cancer Neg Hx    Stomach cancer Neg Hx    Social History   Tobacco Use   Smoking status: Never   Smokeless tobacco: Never  Vaping Use   Vaping status: Never Used  Substance Use Topics   Alcohol use: No   Drug use: No   Current Outpatient Medications  Medication Sig Dispense Refill   albuterol (PROVENTIL) (2.5 MG/3ML) 0.083% nebulizer solution INHALE THE CONTENTS OF 1 VIAL BY MOUTH EVERY 6 HOURS AS NEEDED 675 mL 1   albuterol (VENTOLIN HFA) 108 (90 Base) MCG/ACT inhaler INHALE TWO PUFFS BY MOUTH EVERY 4 HOURS AS NEEDED 60.3 g 2   amiodarone  (PACERONE) 200 MG tablet Take 1 tablet (200 mg total) by mouth 2 (two) times daily. 60 tablet 2   apixaban (ELIQUIS) 5 MG TABS tablet NEW PRESCRIPTION REQUEST: Eliquis 5 Mg - TAKE ONE TABLET BY MOUTH TWICE DAILY 180 tablet 3   atorvastatin (LIPITOR) 80 MG tablet Take 1 tablet (80 mg total) by mouth at bedtime. 90 tablet 3   budesonide-formoterol (SYMBICORT) 80-4.5 MCG/ACT inhaler INHALE TWO PUFFS BY MOUTH INTO THE LUNGS TWICE DAILY 10.2 g 0   carvedilol (COREG) 12.5 MG tablet Carvedilol 12.5 Mg- TAKE ONE TABLET BY MOUTH TWICE DAILY Do not take on dialysis days Tuesday Thursday Saturdays 180 tablet 3   Cholecalciferol 125 MCG (5000 UT) TABS Take 1 tablet (5,000 Units total) by mouth daily. 90 tablet 1   docusate sodium (COLACE) 100 MG capsule TAKE ONE CAPSULE BY MOUTH DAILY 90 capsule 1   EPINEPHrine 0.3 mg/0.3 mL IJ SOAJ injection Inject  0.3 mg into the muscle once as needed for up to 2 doses (if worsening tongue swelling, SOB, hypoxia, or other concerns for progressive anaphylaxis). 2 each 2   ethyl chloride spray SPRAY A SMALL AMOUNT THREE TIMES A WEEK JUST PRIOR TO NEEDLE INSERTION 116 mL 5   fluticasone (FLONASE) 50 MCG/ACT nasal spray INSTILL 1 SPRAY IN EACH NOTRIL DAILY AS NEEDED 15 g 2   hydrOXYzine (ATARAX) 25 MG tablet Take 3 tablets (75 mg total) by mouth at bedtime. 90 tablet 1   lanthanum (FOSRENOL) 1000 MG chewable tablet Chew 2 tablets (2,000 mg total) by mouth 3 (three) times daily AND 1 tablet (1,000 mg total) 2 (two) times daily as needed with snacks 720 tablet 3   linaclotide (LINZESS) 72 MCG capsule TAKE 1 CAPSULE (72 MCG TOTAL) BY MOUTH EVERY MONDAY, WEDNESDAY, FRIDAY, AND SATURDAY. 64 capsule 1   mexiletine (MEXITIL) 150 MG capsule NEW PRESCRIPTION REQUEST: Mexiletine 150 Mg- TAKE TWO CAPSULES BY MOUTH TWICE DAILY 360 capsule 3   pantoprazole (PROTONIX) 40 MG tablet NEW PRESCRIPTION REQUEST: Pantoprazole Sod Dr 40 Mg - TAKE ONE TABLET BY MOUTH DAILY 90 tablet 3    PEG-KCl-NaCl-NaSulf-Na Asc-C (PLENVU) 140 g SOLR Take 1 kit by mouth as directed. 3 each 0   Polyethylene Glycol 3350 (PEG 3350) 17 g PACK NEW PRESCRIPTION REQUEST: Polyethylene Glycol 3350- 1 PACKET BY MOUTH DAILY AS NEEDED 90 packet 0   predniSONE (DELTASONE) 10 MG tablet TAKE ONE TABLET BY MOUTH DAILY WITH BREAKFAST 30 tablet 0   Rectal Protectant-Emollient (CALMOL-4) 76-10 % SUPP Use as directed once to twice daily 8 suppository 0   sildenafil (VIAGRA) 50 MG tablet Take 50 mg by mouth daily as needed.     tadalafil (CIALIS) 10 MG tablet Take 1 tablet (10 mg total) by mouth daily as needed 30 min prior to sexual activity.  Do not take with Imdur (isosorbide) or nitroglycerin 30 tablet 1   VELPHORO 500 MG chewable tablet Chew 1,000 mg by mouth 3 (three) times daily.     No current facility-administered medications for this visit.   Allergies  Allergen Reactions   Ace Inhibitors Swelling    Swelling of the tongue   Influenza Vaccines Hives and Swelling    SWELLING REACTION UNSPECIFIED    Ketorolac Swelling    SWELLING REACTION UNSPECIFIED    Lidocaine Swelling    TONGUE SWELLS   Lisinopril Swelling    TONGUE SWELLING Pt reported problem with a BP med which sounded like  lisinopril  But as of 09/19/06,pt had tolerated altace without problem   Penicillins Swelling    TONGUE SWELLS Has patient had a PCN reaction causing immediate rash, facial/tongue/throat swelling, SOB or lightheadedness with hypotension: Yes Has patient had a PCN reaction causing severe rash involving mucus membranes or skin necrosis: No Has patient had a PCN reaction that required hospitalization No Has patient had a PCN reaction occurring within the last 10 years: Yes If all of the above answers are "NO", then may proceed with Cephalosporin use.      Review of Systems: All systems reviewed and negative except where noted in HPI.   Lab Results  Component Value Date   WBC 6.4 01/19/2023   HGB 14.1 01/19/2023    HCT 44.1 01/19/2023   MCV 102.6 (H) 01/19/2023   PLT 206 01/19/2023    Lab Results  Component Value Date   NA 145 10/26/2022   CL 98 10/26/2022   K 3.9 10/26/2022  CO2 27 10/26/2022   BUN 39 (H) 10/26/2022   CREATININE 10.64 (H) 10/26/2022   GFRNONAA 5 (L) 10/26/2022   CALCIUM 8.1 (L) 10/26/2022   PHOS 4.8 (H) 09/07/2022   ALBUMIN 3.2 (L) 01/19/2023   GLUCOSE 111 (H) 10/26/2022    Lab Results  Component Value Date   ALT 74 (H) 01/19/2023   AST 40 01/19/2023   ALKPHOS 72 01/19/2023   BILITOT 0.3 01/19/2023     Physical Exam: BP 100/62   Pulse 71   Ht 5\' 7"  (1.702 m)   Wt 254 lb (115.2 kg)   SpO2 94%   BMI 39.78 kg/m  Constitutional: Pleasant,well-developed, male in no acute distress. Neurological: Alert and oriented to person place and time. Psychiatric: Normal mood and affect. Behavior is normal.   ASSESSMENT: 62 y.o. male here for assessment of the following  1. History of colon polyps   2. Rectal bleeding   3. Hemorrhoids, unspecified hemorrhoid type   4. Constipation, unspecified constipation type   5. Anticoagulated   6. Cardiac arrhythmia, unspecified cardiac arrhythmia type   7. Chronic combined systolic and diastolic CHF (congestive heart failure) (HCC)   8. ESRD on dialysis [facility])    Patient is overdue for surveillance colonoscopy and ideally would like to do this for his history of colon polyps in the setting of his intermittent rectal bleeding.  Bleeding is likely due to hemorrhoids in the setting of constipation and anticoagulation, want to make sure nothing else going on.  His procedure has been been delayed for several months.  It was scheduled last September but he had significant cardiac issues as outlined above and was prolonged for several months.  He canceled the last scheduled appointment in April.  He feels stable at this point from a cardiopulmonary perspective and wants to try to get this done if at all possible.  I discussed risks of  the procedure versus risks of not doing the procedure, in light of his comorbidities he is higher than average risk for anesthesia.  After discussion of this and if he wants to proceed or not, he strongly wants to proceed with colonoscopy, understanding all risks of this.  With his arrhythmia history, CHF, end-stage renal disease, he meets criteria to be done at [facility] for cardiac support should he need it.  Logistically this may be challenging in regards to limited openings for outpatient is over there and coordination with my schedule and the patient scheduled.  We tried calling over to the hospital today to get a date and time and they could not accommodate [place] today but will call back next week to sort out logistics of this.  In the interim we will ask for approval from cardiology for clearance to proceed and clearance to hold Eliquis for 2 days prior to the exam.  He will continue Linzess and MiraLAX for now, he will need to continue this every day and increase Linzess dosing shortly before his preparation.  Further recommendations pending results and his course.  PLAN: - will try to coordinate outpatient colonoscopy at [facility] hospital, to be coordinated around his dialysis schedule - he will need clearance to proceed from cardiology and approval to hold Eliquis for 2 days prior to the exam - continue Linzess and Miralax for now  Harlin Rain, MD [facility]

## 2023-09-02 NOTE — Patient Instructions (Signed)
Continue Linzess and Miralax.  We will contact you regarding scheduling a colonoscopy at [facility] once we can identify a date.  Thank you for entrusting me with your care and for choosing [facility], Dr. Ileene Patrick    If your blood pressure at your visit was 140/90 or greater, please contact your primary care physician to follow up on this. ______________________________________________________  If you are age 60 or older, your body mass index should be between 23-30. Your Body mass index is 39.78 kg/m. If this is out of the aforementioned range listed, please consider follow up with your Primary Care Provider.  If you are age 29 or younger, your body mass index should be between 19-25. Your Body mass index is 39.78 kg/m. If this is out of the aformentioned range listed, please consider follow up with your Primary Care Provider.  ________________________________________________________  [facility] providers would like to encourage you to use [facility] to communicate with providers for non-urgent requests or questions.  Due to long hold times on the telephone, sending your provider a message by [facility] may be a faster and more efficient way to get a response.  Please allow 48 business hours for a response.  Please remember that this is for non-urgent requests.  _______________________________________________________  Due to recent changes in healthcare laws, you may see the results of your imaging and laboratory studies on MyChart before your provider has had a chance to review them.  We understand that in some cases there may be results that are confusing or concerning to you. Not all laboratory results come back in the same time frame and the provider may be waiting for multiple results in order to interpret others.  Please give [place] 48 hours in order for your provider to thoroughly review all the results before contacting the office for clarification of your results.

## 2023-09-03 DIAGNOSIS — Z992 Dependence on renal dialysis: Secondary | ICD-10-CM | POA: Diagnosis not present

## 2023-09-03 DIAGNOSIS — D509 Iron deficiency anemia, unspecified: Secondary | ICD-10-CM | POA: Diagnosis not present

## 2023-09-03 DIAGNOSIS — T782XXA Anaphylactic shock, unspecified, initial encounter: Secondary | ICD-10-CM | POA: Diagnosis not present

## 2023-09-03 DIAGNOSIS — N2581 Secondary hyperparathyroidism of renal origin: Secondary | ICD-10-CM | POA: Diagnosis not present

## 2023-09-03 DIAGNOSIS — L299 Pruritus, unspecified: Secondary | ICD-10-CM | POA: Diagnosis not present

## 2023-09-03 DIAGNOSIS — T7840XA Allergy, unspecified, initial encounter: Secondary | ICD-10-CM | POA: Diagnosis not present

## 2023-09-03 DIAGNOSIS — D631 Anemia in chronic kidney disease: Secondary | ICD-10-CM | POA: Diagnosis not present

## 2023-09-03 DIAGNOSIS — N186 End stage renal disease: Secondary | ICD-10-CM | POA: Diagnosis not present

## 2023-09-05 ENCOUNTER — Telehealth: Payer: Self-pay

## 2023-09-05 DIAGNOSIS — Z992 Dependence on renal dialysis: Secondary | ICD-10-CM | POA: Diagnosis not present

## 2023-09-05 DIAGNOSIS — N186 End stage renal disease: Secondary | ICD-10-CM | POA: Diagnosis not present

## 2023-09-05 DIAGNOSIS — I129 Hypertensive chronic kidney disease with stage 1 through stage 4 chronic kidney disease, or unspecified chronic kidney disease: Secondary | ICD-10-CM | POA: Diagnosis not present

## 2023-09-05 NOTE — Telephone Encounter (Signed)
PT returned call. Advised him of scheduled colonoscopy details. Please advise if any further details need to discussed

## 2023-09-05 NOTE — Telephone Encounter (Signed)
Pharmacy please advise on holding Eliquis prior to colonoscopy scheduled for 09/28/2023. Thank you.

## 2023-09-05 NOTE — Telephone Encounter (Signed)
Patient with diagnosis of afib on Eliquis for anticoagulation.    Procedure: colonoscopy Date of procedure: 09/28/23   CHA2DS2-VASc Score = 3   This indicates a 3.2% annual risk of stroke. The patient's score is based upon: CHF History: 1 HTN History: 1 Diabetes History: 0 Stroke History: 0 Vascular Disease History: 1 Age Score: 0 Gender Score: 0     ESRD on HD Platelet count 206  Per office protocol, patient can hold Eliquis for 2 days prior to procedure.    **This guidance is not considered finalized until pre-operative APP has relayed final recommendations.**

## 2023-09-05 NOTE — Telephone Encounter (Signed)
-----   Message from [facility] Marylu Lund H sent at 09/02/2023  3:57 PM EDT ----- Regarding: call [facility] endo unit Call Roselie Awkward, RN at [facility] End[facility]: [phone] to see when patient can have colonoscopy at [facility].  Patient has dialysis Tues, Thurs and Saturday.  [place] has hospital week at [facility] week of Oct 21st. Could he do his colon on Monday or Wednesday the 21st or 23rd?

## 2023-09-05 NOTE — Telephone Encounter (Signed)
Per Roselie Awkward, RN at [facility], patient OK to have colonoscopy at [facility] on Wed, Oct 23rd at 11:30am.  Carollee Herter scheduled the patient. Called and LM for patient re: date of procedure and PV on Thursday, 10-10 at 2:00pm. MyChart message sent to patient with appointment information.  Cardiac Clearance and clearance to hold Eliquis will be sent under separate Telephone encounter

## 2023-09-05 NOTE — Telephone Encounter (Signed)
[facility] Pre-operative Risk Assessment     Request for surgical clearance:     Endoscopy Procedure  What type of surgery is being performed?   COLONOSCOPY  When is this surgery scheduled?     09-28-23  What type of clearance is required ?   Pharmacy  Are there any medications that need to be held prior to surgery and how long? ELIQUIS 2 DAYS  Practice name and name of physician performing surgery?      [facility]  DR Ileene Patrick  What is your office phone and fax number?      Phone- [phone]  Fax- [phone]  ATTN: Berlinda Last, CMA  Anesthesia type (None, local, MAC, general) ?       MAC   Dr. Adela Lank is also requesting Cardiac clearance for the procedure.  Please advise.  Thank you

## 2023-09-06 DIAGNOSIS — D509 Iron deficiency anemia, unspecified: Secondary | ICD-10-CM | POA: Diagnosis not present

## 2023-09-06 DIAGNOSIS — N2581 Secondary hyperparathyroidism of renal origin: Secondary | ICD-10-CM | POA: Diagnosis not present

## 2023-09-06 DIAGNOSIS — D631 Anemia in chronic kidney disease: Secondary | ICD-10-CM | POA: Diagnosis not present

## 2023-09-06 DIAGNOSIS — T7840XA Allergy, unspecified, initial encounter: Secondary | ICD-10-CM | POA: Diagnosis not present

## 2023-09-06 DIAGNOSIS — Z992 Dependence on renal dialysis: Secondary | ICD-10-CM | POA: Diagnosis not present

## 2023-09-06 DIAGNOSIS — N186 End stage renal disease: Secondary | ICD-10-CM | POA: Diagnosis not present

## 2023-09-06 NOTE — Telephone Encounter (Addendum)
Name: CLOVIS MADGE  DOB: [birth year]  MRN: [id number]  Primary Cardiologist: Marca Ancona, MD  Chart reviewed as part of pre-operative protocol coverage. The patient has an upcoming visit scheduled with Francis Dowse, PA on 09/16/2023 at which time clearance can be addressed in case there are any issues that would impact surgical recommendations.  I added preop FYI to appointment note so that provider is aware to address at time of outpatient visit.  Per office protocol the cardiology provider should forward their finalized clearance decision and recommendations regarding antiplatelet therapy to the requesting party below.    Per office protocol, patient can hold Eliquis for 2 days prior to procedure.   I will route this message as FYI to requesting party and remove this message from the preop box as separate preop APP input not needed at this time.   Please call with any questions.  Napoleon Form, Leodis Rains, NP  09/06/2023, 8:13 AM

## 2023-09-06 NOTE — Telephone Encounter (Signed)
Yes, we confirmed the PV as well. He was okay with what was scheduled.

## 2023-09-06 NOTE — Telephone Encounter (Signed)
Appointment 10-11. Will address Cardiology clearance

## 2023-09-07 ENCOUNTER — Emergency Department (HOSPITAL_COMMUNITY): Payer: 59

## 2023-09-07 ENCOUNTER — Other Ambulatory Visit: Payer: Self-pay

## 2023-09-07 ENCOUNTER — Emergency Department (HOSPITAL_COMMUNITY)
Admission: EM | Admit: 2023-09-07 | Discharge: 2023-09-07 | Disposition: A | Discharge status: Home / Self Care | Payer: 59 | Attending: Emergency Medicine | Admitting: Emergency Medicine

## 2023-09-07 DIAGNOSIS — R0989 Other specified symptoms and signs involving the circulatory and respiratory systems: Secondary | ICD-10-CM | POA: Diagnosis not present

## 2023-09-07 DIAGNOSIS — Z79899 Other long term (current) drug therapy: Secondary | ICD-10-CM | POA: Insufficient documentation

## 2023-09-07 DIAGNOSIS — S2242XA Multiple fractures of ribs, left side, initial encounter for closed fracture: Secondary | ICD-10-CM | POA: Diagnosis not present

## 2023-09-07 DIAGNOSIS — Z992 Dependence on renal dialysis: Secondary | ICD-10-CM | POA: Diagnosis not present

## 2023-09-07 DIAGNOSIS — R002 Palpitations: Secondary | ICD-10-CM | POA: Insufficient documentation

## 2023-09-07 DIAGNOSIS — I2510 Atherosclerotic heart disease of native coronary artery without angina pectoris: Secondary | ICD-10-CM | POA: Diagnosis not present

## 2023-09-07 DIAGNOSIS — Z95810 Presence of automatic (implantable) cardiac defibrillator: Secondary | ICD-10-CM | POA: Insufficient documentation

## 2023-09-07 DIAGNOSIS — R079 Chest pain, unspecified: Secondary | ICD-10-CM | POA: Diagnosis not present

## 2023-09-07 DIAGNOSIS — Z7901 Long term (current) use of anticoagulants: Secondary | ICD-10-CM | POA: Diagnosis not present

## 2023-09-07 DIAGNOSIS — I509 Heart failure, unspecified: Secondary | ICD-10-CM | POA: Diagnosis not present

## 2023-09-07 DIAGNOSIS — N186 End stage renal disease: Secondary | ICD-10-CM | POA: Insufficient documentation

## 2023-09-07 LAB — BRAIN NATRIURETIC PEPTIDE: B Natriuretic Peptide: 44.1 pg/mL (ref 0.0–100.0)

## 2023-09-07 LAB — BASIC METABOLIC PANEL
Anion gap: 18 — ABNORMAL HIGH (ref 5–15)
BUN: 26 mg/dL — ABNORMAL HIGH (ref 6–20)
CO2: 25 mmol/L (ref 22–32)
Calcium: 8 mg/dL — ABNORMAL LOW (ref 8.9–10.3)
Chloride: 97 mmol/L — ABNORMAL LOW (ref 98–111)
Creatinine, Ser: 9.98 mg/dL — ABNORMAL HIGH (ref 0.61–1.24)
GFR, Estimated: 5 mL/min — ABNORMAL LOW (ref >60)
Glucose, Bld: 87 mg/dL (ref 70–99)
Potassium: 4 mmol/L (ref 3.5–5.1)
Sodium: 140 mmol/L (ref 135–145)

## 2023-09-07 LAB — CBC
HCT: 48.6 % (ref 39.0–52.0)
Hemoglobin: 15.4 g/dL (ref 13.0–17.0)
MCH: 30.2 pg (ref 26.0–34.0)
MCHC: 31.7 g/dL (ref 30.0–36.0)
MCV: 95.3 fL (ref 80.0–100.0)
Platelets: 188 10*3/uL (ref 150–400)
RBC: 5.1 MIL/uL (ref 4.22–5.81)
RDW: 18.1 % — ABNORMAL HIGH (ref 11.5–15.5)
WBC: 4.3 10*3/uL (ref 4.0–10.5)
nRBC: 0 % (ref 0.0–0.2)

## 2023-09-07 LAB — MAGNESIUM: Magnesium: 2.2 mg/dL (ref 1.7–2.4)

## 2023-09-07 LAB — TROPONIN I (HIGH SENSITIVITY)
Troponin I (High Sensitivity): 102 ng/L (ref <18)
Troponin I (High Sensitivity): 97 ng/L — ABNORMAL HIGH (ref <18)

## 2023-09-07 NOTE — ED Provider Notes (Signed)
Received signout from previous provider, please see his note for complete H&P.  This is a 62 year old male who has history of V. tach arrest status post a [place] Jude's ICD which is now deactivated as patient is DNR and who is here with complaints of heart palpitations since last night.  EMR reviewed, patient has PCI in 2005    EKG Interpretation Date/Time:  Wednesday September 07 2023 03:30:38 EDT Ventricular Rate:  70 PR Interval:  198 QRS Duration:  170 QT Interval:  520 QTC Calculation: 561 R Axis:   -51  Text Interpretation: Normal sinus rhythm Left bundle branch block Abnormal ECG When compared with ECG of 02-Aug-2023 11:40, PREVIOUS ECG IS PRESENT Confirmed by Marily Memos [phone]) on 09/07/2023 6:00:29 AM       7:57 AM Appreciate consultation from cardiologist DR. Allyson Sabal who request trending troponin and reassess of EKG and cardiology can be consulted if there's dynamic changes  10:57 AM I have re-consult cardiologist Dr. Allyson Sabal.  Since pt has flat trop, no concerning arrhythmia on EKG, he recommend close f/u with cardiology for further care.  No change in medication at this time. Pt without complaint of chest pain.   BP 105/65   Pulse 73   Temp 98.4 F (36.9 C)   Resp 16   Ht 5\' 7"  (1.702 m)   Wt 115.2 kg   SpO2 94%   BMI 39.78 kg/m   Results for orders placed or performed during the hospital encounter of 09/07/23  Basic metabolic panel  Result Value Ref Range   Sodium 140 135 - 145 mmol/L   Potassium 4.0 3.5 - 5.1 mmol/L   Chloride 97 (L) 98 - 111 mmol/L   CO2 25 22 - 32 mmol/L   Glucose, Bld 87 70 - 99 mg/dL   BUN 26 (H) 6 - 20 mg/dL   Creatinine, Ser 4.01 (H) 0.61 - 1.24 mg/dL   Calcium 8.0 (L) 8.9 - 10.3 mg/dL   GFR, Estimated 5 (L) >60 mL/min   Anion gap 18 (H) 5 - 15  CBC  Result Value Ref Range   WBC 4.3 4.0 - 10.5 K/uL   RBC 5.10 4.22 - 5.81 MIL/uL   Hemoglobin 15.4 13.0 - 17.0 g/dL   HCT 02.7 25.3 - 66.4 %   MCV 95.3 80.0 - 100.0 fL   MCH 30.2 26.0  - 34.0 pg   MCHC 31.7 30.0 - 36.0 g/dL   RDW 40.3 (H) 47.4 - 25.9 %   Platelets 188 150 - 400 K/uL   nRBC 0.0 0.0 - 0.2 %  Brain natriuretic peptide  Result Value Ref Range   B Natriuretic Peptide 44.1 0.0 - 100.0 pg/mL  Magnesium  Result Value Ref Range   Magnesium 2.2 1.7 - 2.4 mg/dL  Troponin I (High Sensitivity)  Result Value Ref Range   Troponin I (High Sensitivity) 102 (HH) <18 ng/L  Troponin I (High Sensitivity)  Result Value Ref Range   Troponin I (High Sensitivity) 97 (H) <18 ng/L   DG Chest 2 View  Result Date: 09/07/2023 CLINICAL DATA:  Chest pain EXAM: CHEST - 2 VIEW COMPARISON:  10/26/2022 FINDINGS: Artifact from EKG pads including over the right upper lobe Symmetric interstitial prominence without Kerley lines. No effusion, pneumothorax, or pulmonary consolidation. Remote left rib fractures. External defibrillator on the left. Normal heart size. IMPRESSION: Mild vascular congestion. Electronically Signed   By: Tiburcio Pea M.D.   On: 09/07/2023 04:35      Fayrene Helper, PA-C 09/07/23  [address]    Virgina Norfolk, DO 09/07/23 1426

## 2023-09-07 NOTE — Discharge Instructions (Signed)
You have been evaluated for your symptoms.  We recommend for you to call and follow-up closely with your cardiologist for outpatient evaluation of your heart palpitation.  You may benefit from medication change if appropriate.

## 2023-09-07 NOTE — ED Provider Notes (Signed)
[place] EMERGENCY DEPARTMENT AT [facility] Provider Note   CSN: [id number] Arrival date & time: 09/07/23  9528     History  Chief Complaint  Patient presents with   Palpitations    POLO MCMARTIN is a 62 y.o. male.  HPI   Patient with medical history including CAD, ischemic CMP, end-stage renal disease on dialysis Tuesday Thursday Saturday, history of VT tach arrest status post [facility] Jude's ICD which is now deactivated, atrial fibrillation on Eliquis, presents emerged part complaints of heart palpitations.  Patient states this started around 11 PM yesterday, states he will feel his heart beat faster quiver, last about a few seconds then resolved, states happened about 5 separate times, not associated with chest pain shortness of breath pleuritic chest pain, denies any orthopnea or worsening peripheral edema, denies any worsening dyspnea, he has no fevers chills cough congestion, states he has been compliant with his medications, as well as his dialysis.   Reviewed patient's chart followed by congestive heart failure clinic, initially had [facility] Jude's ICD placed due to V. tach arrest, unfortunately had VT storm, requiring intubation, resulted in severe heart failure and underwent CRRT.  Once patient was stabilized, decided on becoming DNR and ICD was deactivated, currently medically managed.  Concern for possible cardiac sarcoid, currently on prednisone.  Home Medications Prior to Admission medications   Medication Sig Start Date End Date Taking? Authorizing Provider  albuterol (PROVENTIL) (2.5 MG/3ML) 0.083% nebulizer solution INHALE THE CONTENTS OF 1 VIAL BY MOUTH EVERY 6 HOURS AS NEEDED 07/27/23  Yes [name], MD  albuterol (VENTOLIN HFA) 108 (90 Base) MCG/ACT inhaler INHALE TWO PUFFS BY MOUTH EVERY 4 HOURS AS NEEDED 04/14/23  Yes [name], MD  ALLERGY RELIEF CETIRIZINE 10 MG tablet Take 10 mg by mouth 2 (two) times daily. 08/23/23  Yes [provider]  amiodarone (PACERONE) 200 MG tablet Take 1 tablet (200 mg total) by mouth 2 (two) times daily. Patient taking differently: Take 400 mg by mouth 2 (two) times daily. 01/13/23  Yes Laurey Morale, MD  apixaban (ELIQUIS) 5 MG TABS tablet NEW PRESCRIPTION REQUEST: Eliquis 5 Mg - TAKE ONE TABLET BY MOUTH TWICE DAILY 12/01/22  Yes Laurey Morale, MD  atorvastatin (LIPITOR) 80 MG tablet Take 1 tablet (80 mg total) by mouth at bedtime. 12/01/22 12/01/23 Yes Laurey Morale, MD  budesonide-formoterol [facility]) 80-4.5 MCG/ACT inhaler INHALE TWO PUFFS BY MOUTH INTO THE LUNGS TWICE DAILY 08/18/23  Yes [name], MD  carvedilol (COREG) 6.25 MG tablet Take 6.25 mg by mouth 2 (two) times daily. 07/27/23  Yes [provider]  Cholecalciferol 125 MCG (5000 UT) TABS Take 1 tablet (5,000 Units total) by mouth daily. 12/01/22  Yes [name], MD  docusate sodium (COLACE) 100 MG capsule TAKE ONE CAPSULE BY MOUTH DAILY 07/27/23  Yes [name], MD  EPINEPHrine 0.3 mg/0.3 mL IJ SOAJ injection Inject 0.3 mg into the muscle once as needed for up to 2 doses (if worsening tongue swelling, SOB, hypoxia, or other concerns for progressive anaphylaxis). 12/01/22  Yes [name], MD  ethyl chloride spray SPRAY A SMALL AMOUNT THREE TIMES A WEEK JUST PRIOR TO NEEDLE INSERTION 06/27/23  Yes [name], MD  fluticasone [facility]) 50 MCG/ACT nasal spray INSTILL 1 SPRAY IN EACH NOTRIL DAILY AS NEEDED 08/24/23  Yes [name], MD  hydrOXYzine (ATARAX) 25 MG tablet Take 3 tablets (75 mg total) by mouth at bedtime.  06/27/23  Yes [name], MD  lanthanum (FOSRENOL) 1000 MG chewable tablet Chew 2 tablets (2,000 mg total) by mouth 3 (three) times daily AND 1 tablet (1,000 mg total) 2 (two) times daily as needed with snacks 06/14/23  Yes Anthony Sar, MD  linaclotide [facility]) 72 MCG capsule TAKE 1 CAPSULE (72 MCG TOTAL) BY MOUTH EVERY MONDAY, WEDNESDAY, FRIDAY, AND  SATURDAY. 03/10/23  Yes [name], MD  mexiletine (MEXITIL) 150 MG capsule NEW PRESCRIPTION REQUEST: Mexiletine 150 Mg- TAKE TWO CAPSULES BY MOUTH TWICE DAILY 12/15/22  Yes Laurey Morale, MD  multivitamin (RENA-VIT) TABS tablet Take 1 tablet by mouth daily. 08/23/23  Yes [provider]  pantoprazole (PROTONIX) 40 MG tablet NEW PRESCRIPTION REQUEST: Pantoprazole Sod Dr 40 Mg - TAKE ONE TABLET BY MOUTH DAILY 12/01/22  Yes Laurey Morale, MD  Polyethylene Glycol 3350 (PEG 3350) 17 g PACK NEW PRESCRIPTION REQUEST: Polyethylene Glycol 3350- 1 PACKET BY MOUTH DAILY AS NEEDED 12/01/22  Yes [name], MD  predniSONE (DELTASONE) 10 MG tablet TAKE ONE TABLET BY MOUTH DAILY WITH BREAKFAST 07/19/23  Yes Laurey Morale, MD  tadalafil (CIALIS) 10 MG tablet Take 1 tablet (10 mg total) by mouth daily as needed 30 min prior to sexual activity.  Do not take with Imdur (isosorbide) or nitroglycerin 02/22/23  Yes   PEG-KCl-NaCl-NaSulf-Na Asc-C (PLENVU) 140 g SOLR Take 1 kit by mouth as directed. Patient not taking: Reported on 09/07/2023 02/15/23   Benancio Deeds, MD      Allergies    Ace inhibitors, Influenza vaccines, Ketorolac, Lidocaine, Lisinopril, and Penicillins    Review of Systems   Review of Systems  Constitutional:  Negative for chills and fever.  Respiratory:  Negative for shortness of breath.   Cardiovascular:  Positive for palpitations. Negative for chest pain.  Gastrointestinal:  Negative for abdominal pain.  Neurological:  Negative for headaches.    Physical Exam Updated Vital Signs BP (!) 106/50   Pulse 69   Temp 98.4 F (36.9 C)   Resp 16   Ht 5\' 7"  (1.702 m)   Wt 115.2 kg   SpO2 93%   BMI 39.78 kg/m  Physical Exam Vitals and nursing note reviewed.  Constitutional:      General: He is not in acute distress.    Appearance: He is not ill-appearing.  HENT:     Head: Normocephalic and atraumatic.     Nose: No congestion.  Eyes:      Conjunctiva/sclera: Conjunctivae normal.  Neck:     Comments: No noted JVD Cardiovascular:     Rate and Rhythm: Normal rate and regular rhythm.     Pulses: Normal pulses.     Heart sounds: No murmur heard.    No friction rub. No gallop.  Pulmonary:     Effort: No respiratory distress.     Breath sounds: No wheezing, rhonchi or rales.  Musculoskeletal:     Right lower leg: No edema.     Left lower leg: No edema.     Comments: No unilateral leg swelling no calf tenderness no palpable cords,  Fissure present in the right AC good palpable thrill no evidence of infection 2+ radial pulses.  Skin:    General: Skin is warm and dry.  Neurological:     Mental Status: He is alert.  Psychiatric:        Mood and Affect: Mood normal.     ED Results / Procedures / Treatments   Labs (  all labs ordered are listed, but only abnormal results are displayed) Labs Reviewed  BASIC METABOLIC PANEL - Abnormal; Notable for the following components:      Result Value   Chloride 97 (*)    BUN 26 (*)    Creatinine, Ser 9.98 (*)    Calcium 8.0 (*)    GFR, Estimated 5 (*)    Anion gap 18 (*)    All other components within normal limits  CBC - Abnormal; Notable for the following components:   RDW 18.1 (*)    All other components within normal limits  TROPONIN I (HIGH SENSITIVITY) - Abnormal; Notable for the following components:   Troponin I (High Sensitivity) 102 (*)    All other components within normal limits  BRAIN NATRIURETIC PEPTIDE  MAGNESIUM  TROPONIN I (HIGH SENSITIVITY)    EKG EKG Interpretation Date/Time:  Wednesday September 07 2023 03:30:38 EDT Ventricular Rate:  70 PR Interval:  198 QRS Duration:  170 QT Interval:  520 QTC Calculation: 561 R Axis:   -51  Text Interpretation: Normal sinus rhythm Left bundle branch block Abnormal ECG When compared with ECG of 02-Aug-2023 11:40, PREVIOUS ECG IS PRESENT Confirmed by Marily Memos [phone]) on 09/07/2023 6:00:29 AM  Radiology DG  Chest 2 View  Result Date: 09/07/2023 CLINICAL DATA:  Chest pain EXAM: CHEST - 2 VIEW COMPARISON:  10/26/2022 FINDINGS: Artifact from EKG pads including over the right upper lobe Symmetric interstitial prominence without Kerley lines. No effusion, pneumothorax, or pulmonary consolidation. Remote left rib fractures. External defibrillator on the left. Normal heart size. IMPRESSION: Mild vascular congestion. Electronically Signed   By: Tiburcio Pea M.D.   On: 09/07/2023 04:35    Procedures Procedures    Medications Ordered in ED Medications - No data to display  ED Course/ Medical Decision Making/ A&P                                 Medical Decision Making Amount and/or Complexity of Data Reviewed Labs: ordered. Radiology: ordered.   This patient presents to the ED for concern of heart palpitations, this involves an extensive number of treatment options, and is a complaint that carries with it a high risk of complications and morbidity.  The differential diagnosis includes arrhythmia, metabolic abnormality, ACS, PE, dissection,    Additional history obtained:  Additional history obtained from N/A External records from outside source obtained and reviewed including cardiology notes   Co morbidities that complicate the patient evaluation  ESRD, CAD,  Social Determinants of Health:  na    Lab Tests:  I Ordered, and personally interpreted labs.  The pertinent results include: CBC is unremarkable, BMP reveals chloride 97, BUN 26, creatinine 9, calcium 8, GFR is 18, troponin 102   Imaging Studies ordered:  I ordered imaging studies including chest x-ray I independently visualized and interpreted imaging which showed mild pulmonary congestion I agree with the radiologist interpretation   Cardiac Monitoring:  The patient was maintained on a cardiac monitor.  I personally viewed and interpreted the cardiac monitored which showed an underlying rhythm of: EKG without  signs of ischemia   Medicines ordered and prescription drug management:  I ordered medication including N/A I have reviewed the patients home medicines and have made adjustments as needed  Critical Interventions:  N/A   Reevaluation:  Presents with heart palpitations, triage obtain basic lab workup, will add on additional lab workup for further workup  Consultations Obtained:  N/A    Test Considered:  N/A    Rule out I have low suspicion for ACS as history is atypical, EKG was sinus rhythm without signs of ischemia.  Patient does have slight elevation in his troponins, possibly from poor creatinine clearance, patient will need repeat troponin.  Low suspicion for PE as patient denies pleuritic chest pain, shortness of breath, patient denies leg pain, no pedal edema noted on exam, vital signs reassuring nontachypneic nonhypoxic, currently on anticoag's.  Doubt CHF exacerbation patient is not overloaded my exam, no rales, no new ox requirements, no peripheral edema present.  Low suspicion for AAA or aortic dissection as history is atypical, patient has low risk factors.  Low suspicion for systemic infection as patient is nontoxic-appearing, vital signs reassuring, no obvious source infection noted on exam.     Dispostion and problem list  Due to shift change patient may handoff to Fayrene Helper PA-C  Follow-up on second troponin, would recommend consulting with cardiology as I am concerned patient may have had arrhythmia i.e. V. tach see if patient needs medication changes and/or admission.            Final Clinical Impression(s) / ED Diagnoses Final diagnoses:  Palpitations    Rx / DC Orders ED Discharge Orders     None         Carroll Sage, PA-C 09/07/23 [id number]    Marily Memos, MD 09/08/23 [phone]

## 2023-09-07 NOTE — ED Triage Notes (Signed)
Pt arrives to ED c/o intermittent heart palpitations x 1 day. Pt reports that he can feel it more when laying down. Pt denies CP, N/V and SOB. Pt reports hx of a-fib.

## 2023-09-08 DIAGNOSIS — D631 Anemia in chronic kidney disease: Secondary | ICD-10-CM | POA: Diagnosis not present

## 2023-09-08 DIAGNOSIS — N2581 Secondary hyperparathyroidism of renal origin: Secondary | ICD-10-CM | POA: Diagnosis not present

## 2023-09-08 DIAGNOSIS — N186 End stage renal disease: Secondary | ICD-10-CM | POA: Diagnosis not present

## 2023-09-08 DIAGNOSIS — D509 Iron deficiency anemia, unspecified: Secondary | ICD-10-CM | POA: Diagnosis not present

## 2023-09-08 DIAGNOSIS — Z992 Dependence on renal dialysis: Secondary | ICD-10-CM | POA: Diagnosis not present

## 2023-09-08 DIAGNOSIS — T7840XA Allergy, unspecified, initial encounter: Secondary | ICD-10-CM | POA: Diagnosis not present

## 2023-09-10 DIAGNOSIS — Z992 Dependence on renal dialysis: Secondary | ICD-10-CM | POA: Diagnosis not present

## 2023-09-10 DIAGNOSIS — D631 Anemia in chronic kidney disease: Secondary | ICD-10-CM | POA: Diagnosis not present

## 2023-09-10 DIAGNOSIS — N2581 Secondary hyperparathyroidism of renal origin: Secondary | ICD-10-CM | POA: Diagnosis not present

## 2023-09-10 DIAGNOSIS — D509 Iron deficiency anemia, unspecified: Secondary | ICD-10-CM | POA: Diagnosis not present

## 2023-09-10 DIAGNOSIS — N186 End stage renal disease: Secondary | ICD-10-CM | POA: Diagnosis not present

## 2023-09-10 DIAGNOSIS — T7840XA Allergy, unspecified, initial encounter: Secondary | ICD-10-CM | POA: Diagnosis not present

## 2023-09-12 ENCOUNTER — Telehealth: Payer: Self-pay | Admitting: *Deleted

## 2023-09-12 NOTE — Telephone Encounter (Signed)
Victor Thompson,  This pt's cardiac EF is below the [facility] standard.  His procedure will need to be done at the hospital.  Thanks,    

## 2023-09-12 NOTE — Telephone Encounter (Signed)
Pt is scheduled for 09/28/23 At [facility]

## 2023-09-13 DIAGNOSIS — N186 End stage renal disease: Secondary | ICD-10-CM | POA: Diagnosis not present

## 2023-09-13 DIAGNOSIS — D509 Iron deficiency anemia, unspecified: Secondary | ICD-10-CM | POA: Diagnosis not present

## 2023-09-13 DIAGNOSIS — N2581 Secondary hyperparathyroidism of renal origin: Secondary | ICD-10-CM | POA: Diagnosis not present

## 2023-09-13 DIAGNOSIS — Z992 Dependence on renal dialysis: Secondary | ICD-10-CM | POA: Diagnosis not present

## 2023-09-13 DIAGNOSIS — D631 Anemia in chronic kidney disease: Secondary | ICD-10-CM | POA: Diagnosis not present

## 2023-09-13 DIAGNOSIS — T7840XA Allergy, unspecified, initial encounter: Secondary | ICD-10-CM | POA: Diagnosis not present

## 2023-09-15 ENCOUNTER — Other Ambulatory Visit: Payer: Self-pay

## 2023-09-15 ENCOUNTER — Ambulatory Visit: Payer: 59

## 2023-09-15 VITALS — Ht 67.0 in | Wt 248.0 lb

## 2023-09-15 DIAGNOSIS — Z992 Dependence on renal dialysis: Secondary | ICD-10-CM | POA: Diagnosis not present

## 2023-09-15 DIAGNOSIS — T7840XA Allergy, unspecified, initial encounter: Secondary | ICD-10-CM | POA: Diagnosis not present

## 2023-09-15 DIAGNOSIS — D631 Anemia in chronic kidney disease: Secondary | ICD-10-CM | POA: Diagnosis not present

## 2023-09-15 DIAGNOSIS — N2581 Secondary hyperparathyroidism of renal origin: Secondary | ICD-10-CM | POA: Diagnosis not present

## 2023-09-15 DIAGNOSIS — D509 Iron deficiency anemia, unspecified: Secondary | ICD-10-CM | POA: Diagnosis not present

## 2023-09-15 DIAGNOSIS — Z860100 Personal history of colon polyps, unspecified: Secondary | ICD-10-CM

## 2023-09-15 DIAGNOSIS — N186 End stage renal disease: Secondary | ICD-10-CM | POA: Diagnosis not present

## 2023-09-15 MED ORDER — PEG 3350-KCL-NA BICARB-NACL 420 G PO SOLR
4000.0000 mL | Freq: Once | ORAL | 0 refills | Status: AC
Start: 2023-09-15 — End: 2023-09-27
  Filled 2023-09-15: qty 4000, 1d supply, fill #0

## 2023-09-15 NOTE — Progress Notes (Signed)
Pt's name and DOB verified at the beginning of the pre-visit wit 2 identifiers  Pt denies any difficulty with ambulating,sitting, laying down or rolling side to side  Gave both [facility] main # and MD on call # prior to instructions.   No egg or soy allergy known to patient   No issues known to pt with past sedation with any surgeries or procedures  Patient denies ever being intubated  Pt has no issues moving head neck or swallowing  No FH of Malignant Hyperthermia  Pt is not on diet pills or shots  Pt is not on home 02   Pt is not on blood thinners   Pt has frequent issues with constipation RN instructed pt to use Miralax per bottles instructions a week before prep days. Pt states they will  Pt is not on dialysis  P has hxc of VTAC and Afib   Pt denies any upcoming cardiac testing  Pt encouraged to use to use Singlecare or Goodrx to reduce cost   Patient's chart reviewed by Cathlyn Parsons CNRA prior to pre-visit and patient appropriate for the [facility].  Pre-visit completed and red dot placed by patient's name on their procedure day (on provider's schedule).  .  Visit by phone  Pt states weight is 254 LB Instructed pt to hold blood thinner med for 2 days per MD isntructions  Instructed pt why it is important to and  to call if they have any changes in health or new medications. Directed them to the # given and on instructions.  Informed pt he will get 4 calls from [facility] RN and to call them back if he misses a call. Pt states e will.    Instructions reviewed with pt and pt states understanding. Instructed to review again prior to procedure. Pt states they will.   Instructions given to pt and by my char

## 2023-09-16 ENCOUNTER — Other Ambulatory Visit: Payer: Self-pay | Admitting: Cardiology

## 2023-09-16 ENCOUNTER — Encounter: Payer: Self-pay | Admitting: Physician Assistant

## 2023-09-16 ENCOUNTER — Ambulatory Visit: Payer: 59 | Attending: Physician Assistant | Admitting: Physician Assistant

## 2023-09-16 VITALS — BP 110/60 | HR 71 | Ht 67.0 in | Wt 250.2 lb

## 2023-09-16 DIAGNOSIS — I472 Ventricular tachycardia, unspecified: Secondary | ICD-10-CM | POA: Diagnosis not present

## 2023-09-16 DIAGNOSIS — D6869 Other thrombophilia: Secondary | ICD-10-CM

## 2023-09-16 DIAGNOSIS — I255 Ischemic cardiomyopathy: Secondary | ICD-10-CM

## 2023-09-16 DIAGNOSIS — Z01810 Encounter for preprocedural cardiovascular examination: Secondary | ICD-10-CM | POA: Diagnosis not present

## 2023-09-16 DIAGNOSIS — I5022 Chronic systolic (congestive) heart failure: Secondary | ICD-10-CM | POA: Diagnosis not present

## 2023-09-16 DIAGNOSIS — E785 Hyperlipidemia, unspecified: Secondary | ICD-10-CM

## 2023-09-16 DIAGNOSIS — I480 Paroxysmal atrial fibrillation: Secondary | ICD-10-CM | POA: Diagnosis not present

## 2023-09-16 NOTE — Patient Instructions (Signed)
Medication Instructions:   Your physician recommends that you continue on your current medications as directed. Please refer to the Current Medication list given to you today.   *If you need a refill on your cardiac medications before your next appointment, please call your pharmacy*   Lab Work:  REQUEST LIVER PROFILE AND TSH  AT DIALYSIS      If you have labs (blood work) drawn today and your tests are completely normal, you will receive your results only by: MyChart Message (if you have MyChart) OR A paper copy in the mail If you have any lab test that is abnormal or we need to change your treatment, we will call you to review the results.   Testing/Procedures: NONE ORDERED  TODAY     Follow-Up: At [facility], you and your health needs are our priority.  As part of our continuing mission to provide you with exceptional heart care, we have created designated Provider Care Teams.  These Care Teams include your primary Cardiologist (physician) and Advanced Practice Providers (APPs -  Physician Assistants and Nurse Practitioners) who all work together to provide you with the care you need, when you need it.  We recommend signing up for the patient portal called "MyChart".  Sign up information is provided on this After Visit Summary.  MyChart is used to connect with patients for Virtual Visits (Telemedicine).  Patients are able to view lab/test results, encounter notes, upcoming appointments, etc.  Non-urgent messages can be sent to your provider as well.   To learn more about what you can do with MyChart, go to [email or web address].    Your next appointment:    2 month(s)  Provider:    You may see Lewayne Bunting, MD or one of the following Advanced Practice Providers on your designated Care Team:   Francis Dowse, [place] Casimiro Needle "Mardelle Matte" Lanna Poche, [place]    Other Instructions

## 2023-09-16 NOTE — Progress Notes (Addendum)
Cardiology Office Note:  .   Date:  09/16/2023  ID:  Victor Thompson, DOB Apr 05, 1961, MRN [id number] PCP: Victor Matar, MD  [facility] Providers Cardiologist:  Victor Ancona, MD Electrophysiologist:  Victor Bunting, MD {  History of Present Illness: .   Victor Thompson is a 62 y.o. male w/PMHx of CAD (PCI 2007), ICM w/hx of arrest and ICD (at [facility] in 2015), HTN, HLD, ESRF on HD, chronic CHF (systolic), OSA w/CPAP, morbid obesity, he has h/o seeing/following with pulmonary with episodes of angioedema of unknown trigger that requires epi pen use , AFib, flutter  Sept 2023, hospitalized with VT storm/refractory VT, multiple antiarrhythmic tried, required intubation, sedation, paralyzation >> 10/4 Palliative care consulted due to continued refractory VT. Pt ultimately decided to become DNR/DNI and ICD was deactivated  Suspect sarcoidosis and treatment initiated this stay HF described as end stage  No hospitalizations since  He last saw Dr. Ladona Thompson 12/14/22, feeling suprisingly well, amioadrone 200mg  BID continued, no changes were made  Has seen Dr. Ladona Thompson and the HF team several visits out patient,  most recently the HF team, 08/02/23, feeling well, DOE with stairs, his insurance denied cardiac PET x 2. ICD remains deactivated. Using CPAP  No med changes were made.  ER visit 09/07/23 for recurrent, brief palpitations, discharged from the ER  10/2: K+ 4.0 BUN/Creat 26/9.98 Mag 2.2 HS Trop 102 > 97 BNP 44.1 WBC 4.3 H/H 15/48 Plts 188  Pending a colonoscopy, requiring pre-procedure evaluation/visit RCRI score is 3, 11%   Today's visit is scheduled as a 90mo and pre-op visit  ROS:   He reports some CP of late, locates to his epigastrium, random, more noted at rest , doesn't really feel it when active On/off for a few months, not changing or escalating. No rest or nocturnal SOB + DOE stairs especially, when walking around [place] will get winded as well. +  palpitations he describes as a quiver in his heart beat, lasts seconds, some days on/off fairly frequent, other days none One event of near syncope, was at work, suddenly felt like he was falling/fainting off to the side, was able to walk to the break room and sit, feeling better then. No syncope   Tolerating HD, his BP gets low and sometimes they have to slow.  Other times, they stop until his BP recovers, but is always able to finish He feels like he gets bloated intermittently in between HD sessions. Above symptoms otherwise not noted any correlation to his HD   Device information BSCi S-ICD, implanted 2015 at [facility] + h/o VT, VF and appropriate shocks Has had hospitalizations with VT storms/refractory VT (last Oct 2023, required intubation and sedation ) Mexiletine added Oct 2023 Not felt to be a candidate for VT ablation (not likely to be successful)  Arrhythmia/AAD hx Amiodarone started June 2023 REPORTED ALLERGY TO LIDOCAINE Quinidine and procainamide tried oct 2023 both ineffective  Studies Reviewed: Marland Kitchen    EKG done today and reviewed by myself:  SR 71bpm, LBBB, LAD, 1st degree AVBlock , unchanged from prior  DEVICE interrogation done today and reviewed by myself Device is OFF No data will be available, not interrogated   [facility] 08/25/2022   1st Diag lesion is 60% stenosed.   Ost LAD to Prox LAD lesion is 40% stenosed.   Mid Cx lesion is 30% stenosed.   Prox RCA lesion is 30% stenosed.   1. Elevated right and left heart filling pressures  with pulmonary venous hypertension.  2. Nonobstructive CAD.    Echocardiogram 08/25/2022 LVEF 20-25%, Grade III DD, Mod LAE, mild RAE, mild to mod MR, CVP 15, cannot exclude small PFO.      Risk Assessment/Calculations:    Physical Exam:   VS:  There were no vitals taken for this visit.   Wt Readings from Last 3 Encounters:  09/15/23 248 lb (112.5 kg)  09/07/23 254 lb (115.2 kg)  09/02/23 254 lb (115.2 kg)     GEN: Well nourished, well developed in no acute distress NECK: No JVD; No carotid bruits CARDIAC: RRR, 2/6 SM, no rubs, gallops RESPIRATORY:   CTA b/l without rales, wheezing or rhonchi  ABDOMEN: Soft, non-tender, non-distended EXTREMITIES:  No edema; No deformity   ICD site: is stable, no thinning, fluctuation, tethering  ASSESSMENT AND PLAN: .    VT Long history Numerous AAD tried Current is amiodarone and mexiletine ?lidocaine allergy  Palpitations of unknown etiology (w/hx of Afib as well) Discussed monitoring to evaluate what his palpitations are, to help guide his meds, he does not want to pursue that yet, maybe next visit  Advised amio labs today though he declined, preferred we ask his HD clinic to do them.  Placed in his AVS, he will ask them tomorrow to do the labs  He had one near syncopal event at work a few weeks ago No syncope  ICD Remains off, pt confirmed this is his wish Inquires about having it removed, though in discussing the procedure today, he decided not to want to have it extracted  CAD CP is atypical, reproducible today with palpation of his epigrastrum No-obstructive CAD by cath Sept 2023  ICM Chronic CHF (systolic) Suspect sarcoid Volume management with HD DOE with stairs especially, or longer distances of walking No rest SOB Intermittent bloating, he reports intermittently they have to slow his HD 2/2 low BPs but they are able to complete it    Paroxysmal Afib CHA2DS2Vasc is 3, on eliquis, appropriately dosed Intermittent palpitations described as intermittent/brief ?AF vs V ectopy, NSVTs  Secondary hypercoagulable state 2/2 AFib  9.   Pre-colonoscopy Generally low cardiac risk procedure RCRI score is 3,  is 11% risk Clinically given his extensive history, think his realistic risk is higher then that He is at what sounds his baseline exertional capacity CP he reports is atypical and reproducible, T don't think he needs any new  cardiac evaluation pre-colonoscopy Discussed this with the patient his risk, is elevated but not prohibitive  His colonoscopy is for screening by his report, he wonders if he really needs to, I defer that to him and his GI MD    Dispo: he would like to return in 2 mo, advised sooner if needed.  Also advised if more symptoms to let us know for monitoring, he is agreeable.  Signed, Sheilah Pigeon, PA-C

## 2023-09-17 DIAGNOSIS — N2581 Secondary hyperparathyroidism of renal origin: Secondary | ICD-10-CM | POA: Diagnosis not present

## 2023-09-17 DIAGNOSIS — D509 Iron deficiency anemia, unspecified: Secondary | ICD-10-CM | POA: Diagnosis not present

## 2023-09-17 DIAGNOSIS — T7840XA Allergy, unspecified, initial encounter: Secondary | ICD-10-CM | POA: Diagnosis not present

## 2023-09-17 DIAGNOSIS — N186 End stage renal disease: Secondary | ICD-10-CM | POA: Diagnosis not present

## 2023-09-17 DIAGNOSIS — D631 Anemia in chronic kidney disease: Secondary | ICD-10-CM | POA: Diagnosis not present

## 2023-09-17 DIAGNOSIS — Z992 Dependence on renal dialysis: Secondary | ICD-10-CM | POA: Diagnosis not present

## 2023-09-19 NOTE — Telephone Encounter (Signed)
Patient had his Previsit and received instructions on 10-10. Aware to hold Eliquis for 2 days prior to procedure.  MyChart message sent to patient confirming Cardiology clearance and desire to proceed with procedure on 10-23 at [facility].

## 2023-09-20 DIAGNOSIS — Z992 Dependence on renal dialysis: Secondary | ICD-10-CM | POA: Diagnosis not present

## 2023-09-20 DIAGNOSIS — D509 Iron deficiency anemia, unspecified: Secondary | ICD-10-CM | POA: Diagnosis not present

## 2023-09-20 DIAGNOSIS — N2581 Secondary hyperparathyroidism of renal origin: Secondary | ICD-10-CM | POA: Diagnosis not present

## 2023-09-20 DIAGNOSIS — D631 Anemia in chronic kidney disease: Secondary | ICD-10-CM | POA: Diagnosis not present

## 2023-09-20 DIAGNOSIS — T7840XA Allergy, unspecified, initial encounter: Secondary | ICD-10-CM | POA: Diagnosis not present

## 2023-09-20 DIAGNOSIS — N186 End stage renal disease: Secondary | ICD-10-CM | POA: Diagnosis not present

## 2023-09-21 DIAGNOSIS — G4733 Obstructive sleep apnea (adult) (pediatric): Secondary | ICD-10-CM | POA: Diagnosis not present

## 2023-09-21 DIAGNOSIS — J449 Chronic obstructive pulmonary disease, unspecified: Secondary | ICD-10-CM | POA: Diagnosis not present

## 2023-09-21 NOTE — Telephone Encounter (Signed)
Called and left detailed message confirming procedure next week on Oct 23rd.  Asked patient to call with any questions and hold Eliquis for 2 days prior to procedure.

## 2023-09-22 DIAGNOSIS — D509 Iron deficiency anemia, unspecified: Secondary | ICD-10-CM | POA: Diagnosis not present

## 2023-09-22 DIAGNOSIS — Z992 Dependence on renal dialysis: Secondary | ICD-10-CM | POA: Diagnosis not present

## 2023-09-22 DIAGNOSIS — N186 End stage renal disease: Secondary | ICD-10-CM | POA: Diagnosis not present

## 2023-09-22 DIAGNOSIS — T7840XA Allergy, unspecified, initial encounter: Secondary | ICD-10-CM | POA: Diagnosis not present

## 2023-09-22 DIAGNOSIS — D631 Anemia in chronic kidney disease: Secondary | ICD-10-CM | POA: Diagnosis not present

## 2023-09-22 DIAGNOSIS — N2581 Secondary hyperparathyroidism of renal origin: Secondary | ICD-10-CM | POA: Diagnosis not present

## 2023-09-22 DIAGNOSIS — G4733 Obstructive sleep apnea (adult) (pediatric): Secondary | ICD-10-CM | POA: Diagnosis not present

## 2023-09-24 DIAGNOSIS — N2581 Secondary hyperparathyroidism of renal origin: Secondary | ICD-10-CM | POA: Diagnosis not present

## 2023-09-24 DIAGNOSIS — N186 End stage renal disease: Secondary | ICD-10-CM | POA: Diagnosis not present

## 2023-09-24 DIAGNOSIS — Z992 Dependence on renal dialysis: Secondary | ICD-10-CM | POA: Diagnosis not present

## 2023-09-24 DIAGNOSIS — D631 Anemia in chronic kidney disease: Secondary | ICD-10-CM | POA: Diagnosis not present

## 2023-09-24 DIAGNOSIS — D509 Iron deficiency anemia, unspecified: Secondary | ICD-10-CM | POA: Diagnosis not present

## 2023-09-24 DIAGNOSIS — T7840XA Allergy, unspecified, initial encounter: Secondary | ICD-10-CM | POA: Diagnosis not present

## 2023-09-26 ENCOUNTER — Other Ambulatory Visit: Payer: Self-pay

## 2023-09-27 ENCOUNTER — Ambulatory Visit: Payer: 59

## 2023-09-27 DIAGNOSIS — D631 Anemia in chronic kidney disease: Secondary | ICD-10-CM | POA: Diagnosis not present

## 2023-09-27 DIAGNOSIS — T7840XA Allergy, unspecified, initial encounter: Secondary | ICD-10-CM | POA: Diagnosis not present

## 2023-09-27 DIAGNOSIS — Z992 Dependence on renal dialysis: Secondary | ICD-10-CM | POA: Diagnosis not present

## 2023-09-27 DIAGNOSIS — N2581 Secondary hyperparathyroidism of renal origin: Secondary | ICD-10-CM | POA: Diagnosis not present

## 2023-09-27 DIAGNOSIS — N186 End stage renal disease: Secondary | ICD-10-CM | POA: Diagnosis not present

## 2023-09-27 DIAGNOSIS — D509 Iron deficiency anemia, unspecified: Secondary | ICD-10-CM | POA: Diagnosis not present

## 2023-09-28 ENCOUNTER — Ambulatory Visit (HOSPITAL_COMMUNITY): Payer: 59 | Admitting: Certified Registered Nurse Anesthetist

## 2023-09-28 ENCOUNTER — Ambulatory Visit (HOSPITAL_BASED_OUTPATIENT_CLINIC_OR_DEPARTMENT_OTHER): Payer: 59 | Admitting: Certified Registered Nurse Anesthetist

## 2023-09-28 ENCOUNTER — Other Ambulatory Visit: Payer: Self-pay

## 2023-09-28 ENCOUNTER — Encounter (HOSPITAL_COMMUNITY)
Admission: RE | Disposition: A | Discharge status: Home / Self Care | Payer: Self-pay | Source: Home / Self Care | Attending: Gastroenterology

## 2023-09-28 ENCOUNTER — Telehealth: Payer: Self-pay | Admitting: *Deleted

## 2023-09-28 ENCOUNTER — Ambulatory Visit (HOSPITAL_COMMUNITY)
Admission: RE | Admit: 2023-09-28 | Discharge: 2023-09-28 | Disposition: A | Discharge status: Home / Self Care | Payer: 59 | Attending: Gastroenterology | Admitting: Gastroenterology

## 2023-09-28 DIAGNOSIS — K6289 Other specified diseases of anus and rectum: Secondary | ICD-10-CM | POA: Diagnosis not present

## 2023-09-28 DIAGNOSIS — I429 Cardiomyopathy, unspecified: Secondary | ICD-10-CM | POA: Diagnosis not present

## 2023-09-28 DIAGNOSIS — D122 Benign neoplasm of ascending colon: Secondary | ICD-10-CM

## 2023-09-28 DIAGNOSIS — I499 Cardiac arrhythmia, unspecified: Secondary | ICD-10-CM | POA: Insufficient documentation

## 2023-09-28 DIAGNOSIS — K219 Gastro-esophageal reflux disease without esophagitis: Secondary | ICD-10-CM | POA: Diagnosis not present

## 2023-09-28 DIAGNOSIS — Z95810 Presence of automatic (implantable) cardiac defibrillator: Secondary | ICD-10-CM | POA: Insufficient documentation

## 2023-09-28 DIAGNOSIS — K648 Other hemorrhoids: Secondary | ICD-10-CM

## 2023-09-28 DIAGNOSIS — N186 End stage renal disease: Secondary | ICD-10-CM | POA: Insufficient documentation

## 2023-09-28 DIAGNOSIS — K5900 Constipation, unspecified: Secondary | ICD-10-CM | POA: Diagnosis not present

## 2023-09-28 DIAGNOSIS — I420 Dilated cardiomyopathy: Secondary | ICD-10-CM | POA: Diagnosis not present

## 2023-09-28 DIAGNOSIS — Z1211 Encounter for screening for malignant neoplasm of colon: Secondary | ICD-10-CM

## 2023-09-28 DIAGNOSIS — D126 Benign neoplasm of colon, unspecified: Secondary | ICD-10-CM

## 2023-09-28 DIAGNOSIS — J4489 Other specified chronic obstructive pulmonary disease: Secondary | ICD-10-CM | POA: Diagnosis not present

## 2023-09-28 DIAGNOSIS — I252 Old myocardial infarction: Secondary | ICD-10-CM | POA: Insufficient documentation

## 2023-09-28 DIAGNOSIS — G4730 Sleep apnea, unspecified: Secondary | ICD-10-CM | POA: Insufficient documentation

## 2023-09-28 DIAGNOSIS — Z860100 Personal history of colon polyps, unspecified: Secondary | ICD-10-CM

## 2023-09-28 DIAGNOSIS — Z7901 Long term (current) use of anticoagulants: Secondary | ICD-10-CM | POA: Diagnosis not present

## 2023-09-28 DIAGNOSIS — K602 Anal fissure, unspecified: Secondary | ICD-10-CM | POA: Diagnosis not present

## 2023-09-28 DIAGNOSIS — K625 Hemorrhage of anus and rectum: Secondary | ICD-10-CM | POA: Insufficient documentation

## 2023-09-28 DIAGNOSIS — I2510 Atherosclerotic heart disease of native coronary artery without angina pectoris: Secondary | ICD-10-CM | POA: Insufficient documentation

## 2023-09-28 DIAGNOSIS — Z860101 Personal history of adenomatous and serrated colon polyps: Secondary | ICD-10-CM | POA: Diagnosis not present

## 2023-09-28 DIAGNOSIS — Z992 Dependence on renal dialysis: Secondary | ICD-10-CM | POA: Diagnosis not present

## 2023-09-28 DIAGNOSIS — K644 Residual hemorrhoidal skin tags: Secondary | ICD-10-CM | POA: Insufficient documentation

## 2023-09-28 DIAGNOSIS — I5042 Chronic combined systolic (congestive) and diastolic (congestive) heart failure: Secondary | ICD-10-CM | POA: Insufficient documentation

## 2023-09-28 DIAGNOSIS — K635 Polyp of colon: Secondary | ICD-10-CM | POA: Diagnosis not present

## 2023-09-28 DIAGNOSIS — I472 Ventricular tachycardia, unspecified: Secondary | ICD-10-CM | POA: Diagnosis not present

## 2023-09-28 DIAGNOSIS — I132 Hypertensive heart and chronic kidney disease with heart failure and with stage 5 chronic kidney disease, or end stage renal disease: Secondary | ICD-10-CM | POA: Insufficient documentation

## 2023-09-28 HISTORY — PX: POLYPECTOMY: SHX5525

## 2023-09-28 HISTORY — PX: COLONOSCOPY WITH PROPOFOL: SHX5780

## 2023-09-28 LAB — POCT I-STAT, CHEM 8
BUN: 14 mg/dL (ref 6–20)
Calcium, Ion: 1.01 mmol/L — ABNORMAL LOW (ref 1.15–1.40)
Chloride: 96 mmol/L — ABNORMAL LOW (ref 98–111)
Creatinine, Ser: 9.9 mg/dL — ABNORMAL HIGH (ref 0.61–1.24)
Glucose, Bld: 113 mg/dL — ABNORMAL HIGH (ref 70–99)
HCT: 45 % (ref 39.0–52.0)
Hemoglobin: 15.3 g/dL (ref 13.0–17.0)
Potassium: 3.4 mmol/L — ABNORMAL LOW (ref 3.5–5.1)
Sodium: 140 mmol/L (ref 135–145)
TCO2: 30 mmol/L (ref 22–32)

## 2023-09-28 SURGERY — COLONOSCOPY WITH PROPOFOL
Anesthesia: Monitor Anesthesia Care

## 2023-09-28 MED ORDER — MIDAZOLAM HCL 5 MG/5ML IJ SOLN
INTRAMUSCULAR | Status: DC | PRN
  Administered 2023-09-28: 1 mg via INTRAVENOUS

## 2023-09-28 MED ORDER — SODIUM CHLORIDE 0.9 % IV SOLN
INTRAVENOUS | Status: DC | PRN
Start: 2023-09-28 — End: 2023-09-28

## 2023-09-28 MED ORDER — PROPOFOL 500 MG/50ML IV EMUL
INTRAVENOUS | Status: DC | PRN
  Administered 2023-09-28: 100 ug/kg/min via INTRAVENOUS

## 2023-09-28 MED ORDER — PROPOFOL 10 MG/ML IV BOLUS
INTRAVENOUS | Status: DC | PRN
Start: 2023-09-28 — End: 2023-09-28
  Administered 2023-09-28: 15 mg via INTRAVENOUS
  Administered 2023-09-28: 5 mg via INTRAVENOUS

## 2023-09-28 MED ORDER — PHENYLEPHRINE 80 MCG/ML (10ML) SYRINGE FOR IV PUSH (FOR BLOOD PRESSURE SUPPORT)
PREFILLED_SYRINGE | INTRAVENOUS | Status: DC | PRN
  Administered 2023-09-28: 160 ug via INTRAVENOUS

## 2023-09-28 MED ORDER — AMBULATORY NON FORMULARY MEDICATION: 1 refills | Status: DC

## 2023-09-28 MED ORDER — MIDAZOLAM HCL 2 MG/2ML IJ SOLN
INTRAMUSCULAR | Status: AC
  Filled 2023-09-28: qty 2

## 2023-09-28 SURGICAL SUPPLY — 22 items
ELECT REM PT RETURN 9FT ADLT (ELECTROSURGICAL)
ELECTRODE REM PT RTRN 9FT ADLT (ELECTROSURGICAL) IMPLANT
FCP BXJMBJMB 240X2.8X (CUTTING FORCEPS)
FLOOR PAD 36X40 (MISCELLANEOUS) ×2
FORCEPS BIOP RAD 4 LRG CAP 4 (CUTTING FORCEPS) IMPLANT
FORCEPS BIOP RJ4 240 W/NDL (CUTTING FORCEPS)
FORCEPS BXJMBJMB 240X2.8X (CUTTING FORCEPS) IMPLANT
INJECTOR/SNARE I SNARE (MISCELLANEOUS) IMPLANT
LUBRICANT JELLY 4.5OZ STERILE (MISCELLANEOUS) IMPLANT
MANIFOLD NEPTUNE II (INSTRUMENTS) IMPLANT
NDL SCLEROTHERAPY 25GX240 (NEEDLE) IMPLANT
NEEDLE SCLEROTHERAPY 25GX240 (NEEDLE)
PAD FLOOR 36X40 (MISCELLANEOUS) ×2 IMPLANT
PROBE APC STR FIRE (PROBE) IMPLANT
PROBE INJECTION GOLD (MISCELLANEOUS)
PROBE INJECTION GOLD 7FR (MISCELLANEOUS) IMPLANT
SNARE ROTATE MED OVAL 20MM (MISCELLANEOUS) IMPLANT
SYR 50ML LL SCALE MARK (SYRINGE) IMPLANT
TRAP SPECIMEN MUCOUS 40CC (MISCELLANEOUS) IMPLANT
TUBING ENDO SMARTCAP PENTAX (MISCELLANEOUS) IMPLANT
TUBING IRRIGATION ENDOGATOR (MISCELLANEOUS) ×2 IMPLANT
WATER STERILE IRR 1000ML POUR (IV SOLUTION) IMPLANT

## 2023-09-28 NOTE — Anesthesia Preprocedure Evaluation (Addendum)
Anesthesia Evaluation  Patient identified by MRN, date of birth, ID band Patient awake    Reviewed: Allergy & Precautions, NPO status , Patient's Chart, lab work & pertinent test results, reviewed documented beta blocker date and time   History of Anesthesia Complications Negative for: history of anesthetic complications  Airway Mallampati: III  TM Distance: >3 FB Neck ROM: Limited    Dental no notable dental hx.    Pulmonary shortness of breath, asthma , sleep apnea , pneumonia, COPD Multiple prior episodes of angioedema of unknown trigger   breath sounds clear to auscultation       Cardiovascular hypertension, + CAD, + Past MI and +CHF (LVEF 20-25%)  + dysrhythmias (prior VT storm requiring extended hospitalization) Ventricular Tachycardia + Cardiac Defibrillator (deactivated)  Rhythm:Regular Rate:Normal     Neuro/Psych neg Seizures    GI/Hepatic ,GERD  ,,(+) neg Cirrhosis        Endo/Other    Renal/GU ESRF and DialysisRenal disease     Musculoskeletal   Abdominal   Peds  Hematology  (+) Blood dyscrasia, anemia   Anesthesia Other Findings   Reproductive/Obstetrics                             Anesthesia Physical Anesthesia Plan  ASA: 4  Anesthesia Plan: MAC   Post-op Pain Management:    Induction:   PONV Risk Score and Plan:   Airway Management Planned:   Additional Equipment:   Intra-op Plan:   Post-operative Plan:   Informed Consent: I have reviewed the patients History and Physical, chart, labs and discussed the procedure including the risks, benefits and alternatives for the proposed anesthesia with the patient or authorized representative who has indicated his/her understanding and acceptance.     Dental advisory given  Plan Discussed with: CRNA  Anesthesia Plan Comments:         Anesthesia Quick Evaluation

## 2023-09-28 NOTE — Anesthesia Procedure Notes (Signed)
Procedure Name: MAC Date/Time: 09/28/2023 11:50 AM  Performed by: Audie Pinto, CRNAPre-anesthesia Checklist: Patient identified, Emergency Drugs available, Suction available and Patient being monitored Patient Re-evaluated:Patient Re-evaluated prior to induction Oxygen Delivery Method: Simple face mask Induction Type: IV induction Placement Confirmation: positive ETCO2 Dental Injury: Teeth and Oropharynx as per pre-operative assessment

## 2023-09-28 NOTE — Interval H&P Note (Signed)
History and Physical Interval Note: Patient seen 09/02/23 - here for colonoscopy at the hospital for rectal bleeding, history of colon polyps. He has been off Eliquis for 2 days. I have discussed risks / benefits of the exam and anesthesia with him and he understands he is higher risk for anesthesia given his cardiac history. His case is being done at the hospital in this light. He reports no cardiopulmonary symptoms that have bothered him recently and denies complaints today. States he tolerated the prep well. He wants to proceed following discussion of risks / benefits. Further recommendations pending the results.   09/28/2023 11:02 AM  [name]  has presented today for surgery, with the diagnosis of hx colon polyps, rectal bleeding.  The various methods of treatment have been discussed with the patient and family. After consideration of risks, benefits and other options for treatment, the patient has consented to  Procedure(s): COLONOSCOPY WITH PROPOFOL (N/A) as a surgical intervention.  The patient's history has been reviewed, patient examined, no change in status, stable for surgery.  I have reviewed the patient's chart and labs.  Questions were answered to the patient's satisfaction.     Viviann Spare P [place]

## 2023-09-28 NOTE — Discharge Instructions (Signed)

## 2023-09-28 NOTE — Anesthesia Postprocedure Evaluation (Signed)
Anesthesia Post Note  Patient: Victor Thompson  Procedure(s) Performed: COLONOSCOPY WITH PROPOFOL POLYPECTOMY     Patient location during evaluation: PACU Anesthesia Type: MAC Level of consciousness: awake and alert Pain management: pain level controlled Vital Signs Assessment: post-procedure vital signs reviewed and stable Respiratory status: spontaneous breathing, nonlabored ventilation, respiratory function stable and patient connected to nasal cannula oxygen Cardiovascular status: stable and blood pressure returned to baseline Postop Assessment: no apparent nausea or vomiting Anesthetic complications: no   No notable events documented.  Last Vitals:  Vitals:   09/28/23 1232 09/28/23 1240  BP: 128/77 134/80  Pulse: 72 70  Resp: 19 20  Temp:    SpO2: 99% 92%    Last Pain:  Vitals:   09/28/23 1240  TempSrc:   PainSc: 0-No pain                 Mariann Barter

## 2023-09-28 NOTE — Transfer of Care (Signed)
Immediate Anesthesia Transfer of Care Note  Patient: Victor Thompson  Procedure(s) Performed: COLONOSCOPY WITH PROPOFOL POLYPECTOMY  Patient Location: Endoscopy Unit  Anesthesia Type:MAC  Level of Consciousness: drowsy  Airway & Oxygen Therapy: Patient Spontanous Breathing and Patient connected to face mask oxygen  Post-op Assessment: Report given to RN and Post -op Vital signs reviewed and stable  Post vital signs: Reviewed and stable  Last Vitals:  Vitals Value Taken Time  BP 119/85 09/28/23 1224  Temp    Pulse 71 09/28/23 1225  Resp 16 09/28/23 1225  SpO2 98 % 09/28/23 1225  Vitals shown include unfiled device data.  Last Pain:  Vitals:   09/28/23 1224  TempSrc:   PainSc: Asleep         Complications: No notable events documented.

## 2023-09-28 NOTE — Telephone Encounter (Signed)
Per Dr Adela Lank, he would like rx for diltiazem / lidocaine ointment - pea sized amount PR TID to [facility], 1 tube with a refill.  Rx sent.

## 2023-09-28 NOTE — Op Note (Addendum)
[facility] [name] Procedure Date : 09/28/2023 MRN: [id number] Attending MD: Willaim Rayas. Adela Lank , MD, [id number] Date of Birth: [birth year] CSN: [id number] Age: 62 Admit Type: Outpatient Procedure:                Colonoscopy Indications:              Intermittent rectal bleeding in the setting of                            Eliquis, history of colon polyps Providers:                Viviann Spare P. Adela Lank, MD, Stephens Shire RN, RN,                            Kandice Robinsons, Technician Referring MD:              Medicines:                Monitored Anesthesia Care Complications:            No immediate complications. Estimated blood loss:                            Minimal. Estimated Blood Loss:     Estimated blood loss was minimal. Procedure:                Pre-Anesthesia Assessment:                           - Prior to the procedure, a History and Physical                            was performed, and patient medications and                            allergies were reviewed. The patient's tolerance of                            previous anesthesia was also reviewed. The risks                            and benefits of the procedure and the sedation                            options and risks were discussed with the patient.                            All questions were answered, and informed consent                            was obtained. Prior Anticoagulants: The patient has                            taken Eliquis (apixaban), last dose was 2 days  prior to procedure. ASA Grade Assessment: IV - A                            patient with severe systemic disease that is a                            constant threat to life. After reviewing the risks                            and benefits, the patient was deemed in                            satisfactory condition to undergo the procedure.                           After  obtaining informed consent, the colonoscope                            was passed under direct vision. Throughout the                            procedure, the patient's blood pressure, pulse, and                            oxygen saturations were monitored continuously. The                            CF-HQ190L ([id number]) Olympus colonoscope was                            introduced through the anus and advanced to the the                            cecum, identified by appendiceal orifice and                            ileocecal valve. The colonoscopy was performed                            without difficulty. The patient tolerated the                            procedure well. The quality of the bowel                            preparation was adequate. The ileocecal valve,                            appendiceal orifice, and rectum were photographed. Scope In: 11:58:07 AM Scope Out: 12:17:13 PM Scope Withdrawal Time: 0 hours 13 minutes 35 seconds  Total Procedure Duration: 0 hours 19 minutes 6 seconds  Findings:      A small posterior midline anal fissure was found on perianal exam. There       were  also skin tags.      A diminutive polyp was found in the ascending colon. The polyp was       sessile. The polyp was removed with a cold biopsy forceps. Resection and       retrieval were complete.      A large amount of liquid stool was found in the entire colon, making       visualization difficult. Lavage of the colon was performed using copious       amounts of sterile water, resulting in clearance with adequate       visualization.      Internal hemorrhoids were found during retroflexion. The hemorrhoids       were moderate.      Anal papilla(e) was hypertrophied. Similar in appearance to the last       exam (previously biopsied and no AIN).      The exam was otherwise without abnormality. Impression:               - Anal fissure found on perianal exam.                           -  One diminutive polyp in the ascending colon,                            removed with a cold biopsy forceps. Resected and                            retrieved.                           - Stool in the entire examined colon leading to                            extensive lavage with adequate views.                           - Internal hemorrhoids.                           - Anal papilla(e) was hypertrophied.                           - The examination was otherwise normal.                           Bleeding symptoms likely related to small anal                            fissure and internal hemorrhoids. Recommendation:           - Patient has a contact number available for                            emergencies. The signs and symptoms of potential                            delayed complications were discussed with the  patient. Return to normal activities tomorrow.                            Written discharge instructions were provided to the                            patient.                           - Resume previous diet.                           - Continue present medications.                           - Okay to resume Eliquis today                           - Await pathology results.                           - Continue regimen for constipation                           - Recommend dilatiazem ointment - pea sized amount                            applied PR TID for anal fissure - will write                            prescription for [employer] (only available                            at compounding pharmacy)                           - Trial of Calmol4 suppositories as needed for                            hemorrhoids Procedure Code(s):        --- Professional ---                           [phone], Colonoscopy, flexible; with biopsy, single                            or multiple Diagnosis Code(s):        --- Professional ---                            K60.2, Anal fissure, unspecified                           D12.2, Benign neoplasm of ascending colon                           K64.8, Other hemorrhoids  K62.89, Other specified diseases of anus and rectum                           K62.5, Hemorrhage of anus and rectum CPT copyright 2022 American Medical Association. All rights reserved. The codes documented in this report are preliminary and upon coder review may  be revised to meet current compliance requirements. Viviann Spare P. Mardel Grudzien, MD 09/28/2023 12:26:39 PM This report has been signed electronically. Number of Addenda: 0

## 2023-09-29 DIAGNOSIS — D631 Anemia in chronic kidney disease: Secondary | ICD-10-CM | POA: Diagnosis not present

## 2023-09-29 DIAGNOSIS — N2581 Secondary hyperparathyroidism of renal origin: Secondary | ICD-10-CM | POA: Diagnosis not present

## 2023-09-29 DIAGNOSIS — T7840XA Allergy, unspecified, initial encounter: Secondary | ICD-10-CM | POA: Diagnosis not present

## 2023-09-29 DIAGNOSIS — N186 End stage renal disease: Secondary | ICD-10-CM | POA: Diagnosis not present

## 2023-09-29 DIAGNOSIS — Z992 Dependence on renal dialysis: Secondary | ICD-10-CM | POA: Diagnosis not present

## 2023-09-29 DIAGNOSIS — D509 Iron deficiency anemia, unspecified: Secondary | ICD-10-CM | POA: Diagnosis not present

## 2023-10-01 DIAGNOSIS — N2581 Secondary hyperparathyroidism of renal origin: Secondary | ICD-10-CM | POA: Diagnosis not present

## 2023-10-01 DIAGNOSIS — D509 Iron deficiency anemia, unspecified: Secondary | ICD-10-CM | POA: Diagnosis not present

## 2023-10-01 DIAGNOSIS — Z992 Dependence on renal dialysis: Secondary | ICD-10-CM | POA: Diagnosis not present

## 2023-10-01 DIAGNOSIS — D631 Anemia in chronic kidney disease: Secondary | ICD-10-CM | POA: Diagnosis not present

## 2023-10-01 DIAGNOSIS — N186 End stage renal disease: Secondary | ICD-10-CM | POA: Diagnosis not present

## 2023-10-01 DIAGNOSIS — T7840XA Allergy, unspecified, initial encounter: Secondary | ICD-10-CM | POA: Diagnosis not present

## 2023-10-03 ENCOUNTER — Encounter (HOSPITAL_COMMUNITY): Payer: Self-pay | Admitting: Gastroenterology

## 2023-10-03 ENCOUNTER — Other Ambulatory Visit: Payer: Self-pay | Admitting: Internal Medicine

## 2023-10-03 ENCOUNTER — Other Ambulatory Visit (HOSPITAL_COMMUNITY): Payer: Self-pay | Admitting: Cardiology

## 2023-10-03 LAB — SURGICAL PATHOLOGY

## 2023-10-03 MED ORDER — APIXABAN 5 MG PO TABS: ORAL_TABLET | ORAL | 3 refills | Status: DC

## 2023-10-03 MED ORDER — MEXILETINE HCL 150 MG PO CAPS: 300.0000 mg | ORAL_CAPSULE | Freq: Two times a day (BID) | ORAL | 3 refills | Status: DC

## 2023-10-03 MED ORDER — AMIODARONE HCL 200 MG PO TABS: 200.0000 mg | ORAL_TABLET | Freq: Two times a day (BID) | ORAL | 2 refills | Status: DC

## 2023-10-03 NOTE — Telephone Encounter (Signed)
Medication Refill - Medication: albuterol (PROVENTIL) (2.5 MG/3ML) 0.083% nebulizer solution [[id number]]   fluticasone (FLONASE) 50 MCG/ACT nasal spray [[id number]]   Has the patient contacted their pharmacy? Yes.   (Agent: If no, request that the patient contact the pharmacy for the refill. If patient does not wish to contact the pharmacy document the reason why and proceed with request.) (Agent: If yes, when and what did the pharmacy advise?)  Preferred Pharmacy (with phone number or street name):  [facility] [place], [place] - [phone] Phone: [phone]  Fax: [phone]     Has the patient been seen for an appointment in the last year OR does the patient have an upcoming appointment? Yes.    Agent: Please be advised that RX refills may take up to 3 business days. We ask that you follow-up with your pharmacy.

## 2023-10-04 DIAGNOSIS — N2581 Secondary hyperparathyroidism of renal origin: Secondary | ICD-10-CM | POA: Diagnosis not present

## 2023-10-04 DIAGNOSIS — Z992 Dependence on renal dialysis: Secondary | ICD-10-CM | POA: Diagnosis not present

## 2023-10-04 DIAGNOSIS — T7840XA Allergy, unspecified, initial encounter: Secondary | ICD-10-CM | POA: Diagnosis not present

## 2023-10-04 DIAGNOSIS — D631 Anemia in chronic kidney disease: Secondary | ICD-10-CM | POA: Diagnosis not present

## 2023-10-04 DIAGNOSIS — N186 End stage renal disease: Secondary | ICD-10-CM | POA: Diagnosis not present

## 2023-10-04 DIAGNOSIS — G4733 Obstructive sleep apnea (adult) (pediatric): Secondary | ICD-10-CM | POA: Diagnosis not present

## 2023-10-04 DIAGNOSIS — D509 Iron deficiency anemia, unspecified: Secondary | ICD-10-CM | POA: Diagnosis not present

## 2023-10-04 MED ORDER — ALBUTEROL SULFATE (2.5 MG/3ML) 0.083% IN NEBU: INHALATION_SOLUTION | RESPIRATORY_TRACT | 1 refills | Status: DC

## 2023-10-04 MED ORDER — FLUTICASONE PROPIONATE 50 MCG/ACT NA SUSP: NASAL | 2 refills | Status: DC

## 2023-10-04 NOTE — Telephone Encounter (Signed)
Requested Prescriptions  Pending Prescriptions Disp Refills   albuterol (PROVENTIL) (2.5 MG/3ML) 0.083% nebulizer solution 675 mL 1    Sig: INHALE THE CONTENTS OF 1 VIAL BY MOUTH EVERY 6 HOURS AS NEEDED     Pulmonology:  Beta Agonists 2 Passed - 10/03/2023  2:44 PM      Passed - Last BP in normal range    BP Readings from Last 1 Encounters:  09/28/23 134/80         Passed - Last Heart Rate in normal range    Pulse Readings from Last 1 Encounters:  09/28/23 70         Passed - Valid encounter within last 12 months    Recent Outpatient Visits           3 months ago Chronic combined systolic and diastolic CHF (congestive heart failure) (HCC)   [facility] Jonah Blue B, MD   7 months ago Chronic combined systolic and diastolic CHF (congestive heart failure) [facility])   [facility] Jonah Blue B, MD   11 months ago Chronic systolic congestive heart failure, NYHA class 2 [facility])   [facility] [name], MD   1 year ago Hospital discharge follow-up   [place] [facility] [name], MD   1 year ago Anxiety about health   [place] [facility], [place], [place]       Future Appointments             In 3 weeks [name], MD [facility]             fluticasone [facility]) 50 MCG/ACT nasal spray 15 g 2    Sig: INSTILL 1 SPRAY IN EACH NOTRIL DAILY AS NEEDED     Ear, Nose, and Throat: Nasal Preparations - Corticosteroids Passed - 10/03/2023  2:44 PM      Passed - Valid encounter within last 12 months    Recent Outpatient Visits           3 months ago Chronic combined systolic and diastolic CHF (congestive heart failure) (HCC)   [place] [facility] Jonah Blue B, MD   7 months ago Chronic combined systolic and diastolic CHF  (congestive heart failure) [facility])   [facility] Jonah Blue B, MD   11 months ago Chronic systolic congestive heart failure, NYHA class 2 [facility])   [facility] & [facility] [name], MD   1 year ago Hospital discharge follow-up   [facility] [name], MD   1 year ago Anxiety about health   [facility], [place], [place]       Future Appointments             In 3 weeks Laural Benes, Binnie Rail, MD [facility] & [facility]

## 2023-10-06 DIAGNOSIS — D509 Iron deficiency anemia, unspecified: Secondary | ICD-10-CM | POA: Diagnosis not present

## 2023-10-06 DIAGNOSIS — N186 End stage renal disease: Secondary | ICD-10-CM | POA: Diagnosis not present

## 2023-10-06 DIAGNOSIS — D631 Anemia in chronic kidney disease: Secondary | ICD-10-CM | POA: Diagnosis not present

## 2023-10-06 DIAGNOSIS — T7840XA Allergy, unspecified, initial encounter: Secondary | ICD-10-CM | POA: Diagnosis not present

## 2023-10-06 DIAGNOSIS — I129 Hypertensive chronic kidney disease with stage 1 through stage 4 chronic kidney disease, or unspecified chronic kidney disease: Secondary | ICD-10-CM | POA: Diagnosis not present

## 2023-10-06 DIAGNOSIS — N2581 Secondary hyperparathyroidism of renal origin: Secondary | ICD-10-CM | POA: Diagnosis not present

## 2023-10-06 DIAGNOSIS — Z992 Dependence on renal dialysis: Secondary | ICD-10-CM | POA: Diagnosis not present

## 2023-10-08 DIAGNOSIS — L299 Pruritus, unspecified: Secondary | ICD-10-CM | POA: Diagnosis not present

## 2023-10-08 DIAGNOSIS — Z992 Dependence on renal dialysis: Secondary | ICD-10-CM | POA: Diagnosis not present

## 2023-10-08 DIAGNOSIS — N186 End stage renal disease: Secondary | ICD-10-CM | POA: Diagnosis not present

## 2023-10-08 DIAGNOSIS — D509 Iron deficiency anemia, unspecified: Secondary | ICD-10-CM | POA: Diagnosis not present

## 2023-10-08 DIAGNOSIS — N2581 Secondary hyperparathyroidism of renal origin: Secondary | ICD-10-CM | POA: Diagnosis not present

## 2023-10-08 DIAGNOSIS — D631 Anemia in chronic kidney disease: Secondary | ICD-10-CM | POA: Diagnosis not present

## 2023-10-10 ENCOUNTER — Ambulatory Visit: Payer: Medicare Other | Admitting: Internal Medicine

## 2023-10-10 ENCOUNTER — Encounter: Payer: Self-pay | Admitting: Gastroenterology

## 2023-10-11 DIAGNOSIS — D631 Anemia in chronic kidney disease: Secondary | ICD-10-CM | POA: Diagnosis not present

## 2023-10-11 DIAGNOSIS — Z992 Dependence on renal dialysis: Secondary | ICD-10-CM | POA: Diagnosis not present

## 2023-10-11 DIAGNOSIS — N186 End stage renal disease: Secondary | ICD-10-CM | POA: Diagnosis not present

## 2023-10-11 DIAGNOSIS — D509 Iron deficiency anemia, unspecified: Secondary | ICD-10-CM | POA: Diagnosis not present

## 2023-10-11 DIAGNOSIS — L299 Pruritus, unspecified: Secondary | ICD-10-CM | POA: Diagnosis not present

## 2023-10-11 DIAGNOSIS — N2581 Secondary hyperparathyroidism of renal origin: Secondary | ICD-10-CM | POA: Diagnosis not present

## 2023-10-13 DIAGNOSIS — D509 Iron deficiency anemia, unspecified: Secondary | ICD-10-CM | POA: Diagnosis not present

## 2023-10-13 DIAGNOSIS — N2581 Secondary hyperparathyroidism of renal origin: Secondary | ICD-10-CM | POA: Diagnosis not present

## 2023-10-13 DIAGNOSIS — Z992 Dependence on renal dialysis: Secondary | ICD-10-CM | POA: Diagnosis not present

## 2023-10-13 DIAGNOSIS — N186 End stage renal disease: Secondary | ICD-10-CM | POA: Diagnosis not present

## 2023-10-13 DIAGNOSIS — L299 Pruritus, unspecified: Secondary | ICD-10-CM | POA: Diagnosis not present

## 2023-10-13 DIAGNOSIS — D631 Anemia in chronic kidney disease: Secondary | ICD-10-CM | POA: Diagnosis not present

## 2023-10-15 DIAGNOSIS — Z992 Dependence on renal dialysis: Secondary | ICD-10-CM | POA: Diagnosis not present

## 2023-10-15 DIAGNOSIS — N186 End stage renal disease: Secondary | ICD-10-CM | POA: Diagnosis not present

## 2023-10-15 DIAGNOSIS — D631 Anemia in chronic kidney disease: Secondary | ICD-10-CM | POA: Diagnosis not present

## 2023-10-15 DIAGNOSIS — N2581 Secondary hyperparathyroidism of renal origin: Secondary | ICD-10-CM | POA: Diagnosis not present

## 2023-10-15 DIAGNOSIS — L299 Pruritus, unspecified: Secondary | ICD-10-CM | POA: Diagnosis not present

## 2023-10-15 DIAGNOSIS — D509 Iron deficiency anemia, unspecified: Secondary | ICD-10-CM | POA: Diagnosis not present

## 2023-10-17 ENCOUNTER — Telehealth (HOSPITAL_COMMUNITY): Payer: Self-pay | Admitting: Cardiology

## 2023-10-17 ENCOUNTER — Other Ambulatory Visit: Payer: Self-pay | Admitting: Cardiology

## 2023-10-17 ENCOUNTER — Telehealth: Payer: Self-pay | Admitting: Gastroenterology

## 2023-10-17 ENCOUNTER — Other Ambulatory Visit: Payer: Self-pay | Admitting: Internal Medicine

## 2023-10-17 NOTE — Telephone Encounter (Signed)
Patients nurse called to advise the patient stated he has been having issues with eating for about 2-3 weeks now. Please advise.

## 2023-10-17 NOTE — Telephone Encounter (Signed)
[employer] clinical reviewer called to report cpt code 16109 (dual image ?PET) was denied in September Three options for this case Peer to peer Provide additional clinicals Submit new PA  Advised since denial over 30 days, will likely restart PA   Message to [facility] CMA

## 2023-10-18 ENCOUNTER — Other Ambulatory Visit: Payer: Self-pay | Admitting: Internal Medicine

## 2023-10-18 DIAGNOSIS — D509 Iron deficiency anemia, unspecified: Secondary | ICD-10-CM | POA: Diagnosis not present

## 2023-10-18 DIAGNOSIS — N186 End stage renal disease: Secondary | ICD-10-CM | POA: Diagnosis not present

## 2023-10-18 DIAGNOSIS — N2581 Secondary hyperparathyroidism of renal origin: Secondary | ICD-10-CM | POA: Diagnosis not present

## 2023-10-18 DIAGNOSIS — Z992 Dependence on renal dialysis: Secondary | ICD-10-CM | POA: Diagnosis not present

## 2023-10-18 DIAGNOSIS — L299 Pruritus, unspecified: Secondary | ICD-10-CM | POA: Diagnosis not present

## 2023-10-18 DIAGNOSIS — D631 Anemia in chronic kidney disease: Secondary | ICD-10-CM | POA: Diagnosis not present

## 2023-10-18 NOTE — Telephone Encounter (Signed)
Patient calls c/o 2-3 weeks with loss of appetite. Says he may eat 3 bites of food and is then done. He denies any early satiety, nausea or vomiting. No abdominal pain or tenderness. No recent medication changes. Patient feels that he has had some weight loss.   Patient does have hx CAD with ICD (now deactivated); currently also on dialysis for ESRD. Had colonoscopy 09/2023.  Patient tells me he is not taking pantoprazole although it remains on his medication list.  Dr Veatrice Bourbon advise.

## 2023-10-18 NOTE — Telephone Encounter (Signed)
Thanks Dottie.  This is a very nonspecific complaint and hard to easily tell what is going on to cause this over the phone, lots of different things to consider.  Either myself or APP would be happy to see him in the office to discuss things further however he should contact his PCP otherwise if he has not seen them so they can do an initial evaluation.  Thanks

## 2023-10-18 NOTE — Telephone Encounter (Signed)
Requested medication (s) are due for refill today:   Yes  Requested medication (s) are on the active medication list:   Yes  Future visit scheduled:   Yes 10/28/2023   Last ordered: 08/18/2023 10.2 g, 0 refills  Returned because an alternative is being suggested by pharmacy.   See list on request.   Requested Prescriptions  Pending Prescriptions Disp Refills   SYMBICORT 80-4.5 MCG/ACT inhaler [Pharmacy Med Name: Symbicort 80 mcg-4.5 mcg/actuation HFA aerosol inhaler] 30.6 g 0    Sig: INHALE TWO PUFFS BY MOUTH TWICE DAILY     Pulmonology:  Combination Products Passed - 10/17/2023 10:59 AM      Passed - Valid encounter within last 12 months    Recent Outpatient Visits           3 months ago Chronic combined systolic and diastolic CHF (congestive heart failure) (HCC)   [facility] [place] [facility] Of [place]. [facility] Jonah Blue B, MD   7 months ago Chronic combined systolic and diastolic CHF (congestive heart failure) [facility])   [place] [facility] Dept Of [place]. [facility] Jonah Blue B, MD   12 months ago Chronic systolic congestive heart failure, NYHA class 2 [facility])   [place] [facility] - A Dept Of [place]. [facility] [name], MD   1 year ago Hospital discharge follow-up   [place] [facility] Dept Of . [name] [name], MD   1 year ago Anxiety about health   [facility] at [facility], [place], [place]       Future Appointments             In 1 week [name], MD [facility] [place] - A Dept Of Eligha Bridegroom. [facility]

## 2023-10-18 NOTE — Telephone Encounter (Signed)
Patient is advised that vague symptom of loss of appetite could be caused by multiple things, would be best to have office evaluation. Patient has scheduled a visit with Doug Sou, PA-C on 11/23/23 at 830 am for further evaluation.

## 2023-10-20 DIAGNOSIS — D631 Anemia in chronic kidney disease: Secondary | ICD-10-CM | POA: Diagnosis not present

## 2023-10-20 DIAGNOSIS — N2581 Secondary hyperparathyroidism of renal origin: Secondary | ICD-10-CM | POA: Diagnosis not present

## 2023-10-20 DIAGNOSIS — D509 Iron deficiency anemia, unspecified: Secondary | ICD-10-CM | POA: Diagnosis not present

## 2023-10-20 DIAGNOSIS — L299 Pruritus, unspecified: Secondary | ICD-10-CM | POA: Diagnosis not present

## 2023-10-20 DIAGNOSIS — N186 End stage renal disease: Secondary | ICD-10-CM | POA: Diagnosis not present

## 2023-10-20 DIAGNOSIS — Z992 Dependence on renal dialysis: Secondary | ICD-10-CM | POA: Diagnosis not present

## 2023-10-21 ENCOUNTER — Other Ambulatory Visit: Payer: Self-pay | Admitting: Internal Medicine

## 2023-10-21 NOTE — Telephone Encounter (Unsigned)
Copied from CRM [id number]. Topic: General - Other >> Oct 21, 2023  2:36 PM Everette C wrote: Reason for CRM: Medication Refill - Most Recent Primary Care Visit:  Provider: Anders Simmonds Department: [facility] Visit Type: OFFICE VISIT Date: 07/27/2023  Medication: budesonide-formoterol (SYMBICORT) 80-4.5 MCG/ACT inhaler [[id number]]  Has the patient contacted their pharmacy? Yes (Agent: If no, request that the patient contact the pharmacy for the refill. If patient does not wish to contact the pharmacy document the reason why and proceed with request.) (Agent: If yes, when and what did the pharmacy advise?)  Is this the correct pharmacy for this prescription? Yes If no, delete pharmacy and type the correct one.  This is the patient's preferred pharmacy:  [facility] - [place], [place] - [address] [place] [address] Phone: [phone] Fax: [phone]    Has the prescription been filled recently? Yes  Is the patient out of the medication? Yes  Has the patient been seen for an appointment in the last year OR does the patient have an upcoming appointment? Yes  Can we respond through MyChart? No  Agent: Please be advised that Rx refills may take up to 3 business days. We ask that you follow-up with your pharmacy.

## 2023-10-22 DIAGNOSIS — N186 End stage renal disease: Secondary | ICD-10-CM | POA: Diagnosis not present

## 2023-10-22 DIAGNOSIS — Z992 Dependence on renal dialysis: Secondary | ICD-10-CM | POA: Diagnosis not present

## 2023-10-22 DIAGNOSIS — N2581 Secondary hyperparathyroidism of renal origin: Secondary | ICD-10-CM | POA: Diagnosis not present

## 2023-10-22 DIAGNOSIS — D631 Anemia in chronic kidney disease: Secondary | ICD-10-CM | POA: Diagnosis not present

## 2023-10-22 DIAGNOSIS — L299 Pruritus, unspecified: Secondary | ICD-10-CM | POA: Diagnosis not present

## 2023-10-22 DIAGNOSIS — D509 Iron deficiency anemia, unspecified: Secondary | ICD-10-CM | POA: Diagnosis not present

## 2023-10-24 NOTE — Telephone Encounter (Signed)
Duplicate request, last refill 10/18/23.  Requested Prescriptions  Pending Prescriptions Disp Refills   budesonide-formoterol (SYMBICORT) 80-4.5 MCG/ACT inhaler 10.2 g 0    Sig: INHALE TWO PUFFS BY MOUTH TWICE DAILY     Pulmonology:  Combination Products Passed - 10/21/2023  3:47 PM      Passed - Valid encounter within last 12 months    Recent Outpatient Visits           3 months ago Chronic combined systolic and diastolic CHF (congestive heart failure) (HCC)   [place] [facility] Of [place]. [facility] Jonah Blue B, MD   8 months ago Chronic combined systolic and diastolic CHF (congestive heart failure) [facility])   [name] [facility] - A Dept Of [place]. [facility] Jonah Blue B, MD   1 year ago Chronic systolic congestive heart failure, NYHA class 2 [facility])   [place] [facility] - A Dept Of [place]. [facility] [name], MD   1 year ago Hospital discharge follow-up   [place] [facility] - A Dept Of [place]. [facility] [name], MD   1 year ago Anxiety about health   [facility] at [facility], [place], [place]       Future Appointments             In 4 days [name], MD [facility] - A Dept Of [place] Bridegroom. [facility]

## 2023-10-25 ENCOUNTER — Encounter (HOSPITAL_COMMUNITY): Payer: Self-pay | Admitting: Cardiology

## 2023-10-25 ENCOUNTER — Ambulatory Visit (HOSPITAL_COMMUNITY)
Admission: RE | Admit: 2023-10-25 | Discharge: 2023-10-25 | Disposition: A | Discharge status: Home / Self Care | Payer: 59 | Source: Ambulatory Visit | Attending: Cardiology | Admitting: Cardiology

## 2023-10-25 ENCOUNTER — Ambulatory Visit: Payer: 59 | Admitting: Podiatry

## 2023-10-25 VITALS — BP 110/70 | HR 59 | Wt 247.4 lb

## 2023-10-25 DIAGNOSIS — N2581 Secondary hyperparathyroidism of renal origin: Secondary | ICD-10-CM | POA: Diagnosis not present

## 2023-10-25 DIAGNOSIS — G4733 Obstructive sleep apnea (adult) (pediatric): Secondary | ICD-10-CM | POA: Insufficient documentation

## 2023-10-25 DIAGNOSIS — I447 Left bundle-branch block, unspecified: Secondary | ICD-10-CM | POA: Diagnosis not present

## 2023-10-25 DIAGNOSIS — Z7901 Long term (current) use of anticoagulants: Secondary | ICD-10-CM | POA: Diagnosis not present

## 2023-10-25 DIAGNOSIS — L299 Pruritus, unspecified: Secondary | ICD-10-CM | POA: Diagnosis not present

## 2023-10-25 DIAGNOSIS — N186 End stage renal disease: Secondary | ICD-10-CM | POA: Diagnosis not present

## 2023-10-25 DIAGNOSIS — I428 Other cardiomyopathies: Secondary | ICD-10-CM | POA: Insufficient documentation

## 2023-10-25 DIAGNOSIS — Z79899 Other long term (current) drug therapy: Secondary | ICD-10-CM | POA: Diagnosis not present

## 2023-10-25 DIAGNOSIS — I132 Hypertensive heart and chronic kidney disease with heart failure and with stage 5 chronic kidney disease, or end stage renal disease: Secondary | ICD-10-CM | POA: Insufficient documentation

## 2023-10-25 DIAGNOSIS — I472 Ventricular tachycardia, unspecified: Secondary | ICD-10-CM | POA: Insufficient documentation

## 2023-10-25 DIAGNOSIS — I480 Paroxysmal atrial fibrillation: Secondary | ICD-10-CM | POA: Insufficient documentation

## 2023-10-25 DIAGNOSIS — Z7952 Long term (current) use of systemic steroids: Secondary | ICD-10-CM | POA: Insufficient documentation

## 2023-10-25 DIAGNOSIS — Z9861 Coronary angioplasty status: Secondary | ICD-10-CM | POA: Insufficient documentation

## 2023-10-25 DIAGNOSIS — Z95810 Presence of automatic (implantable) cardiac defibrillator: Secondary | ICD-10-CM | POA: Insufficient documentation

## 2023-10-25 DIAGNOSIS — I5022 Chronic systolic (congestive) heart failure: Secondary | ICD-10-CM | POA: Insufficient documentation

## 2023-10-25 DIAGNOSIS — I2510 Atherosclerotic heart disease of native coronary artery without angina pectoris: Secondary | ICD-10-CM | POA: Diagnosis not present

## 2023-10-25 DIAGNOSIS — D631 Anemia in chronic kidney disease: Secondary | ICD-10-CM | POA: Diagnosis not present

## 2023-10-25 DIAGNOSIS — Z8674 Personal history of sudden cardiac arrest: Secondary | ICD-10-CM | POA: Diagnosis not present

## 2023-10-25 DIAGNOSIS — Z992 Dependence on renal dialysis: Secondary | ICD-10-CM | POA: Diagnosis not present

## 2023-10-25 DIAGNOSIS — D509 Iron deficiency anemia, unspecified: Secondary | ICD-10-CM | POA: Diagnosis not present

## 2023-10-25 LAB — LIPID PANEL
Cholesterol: 118 mg/dL (ref 0–200)
HDL: 55 mg/dL (ref >40)
LDL Cholesterol: 48 mg/dL (ref 0–99)
Total CHOL/HDL Ratio: 2.1 {ratio}
Triglycerides: 73 mg/dL (ref <150)
VLDL: 15 mg/dL (ref 0–40)

## 2023-10-25 LAB — COMPREHENSIVE METABOLIC PANEL
ALT: 60 U/L — ABNORMAL HIGH (ref 0–44)
AST: 75 U/L — ABNORMAL HIGH (ref 15–41)
Albumin: 2.8 g/dL — ABNORMAL LOW (ref 3.5–5.0)
Alkaline Phosphatase: 75 U/L (ref 38–126)
Anion gap: 12 (ref 5–15)
BUN: 6 mg/dL (ref 6–20)
CO2: 29 mmol/L (ref 22–32)
Calcium: 7.9 mg/dL — ABNORMAL LOW (ref 8.9–10.3)
Chloride: 99 mmol/L (ref 98–111)
Creatinine, Ser: 6.14 mg/dL — ABNORMAL HIGH (ref 0.61–1.24)
GFR, Estimated: 10 mL/min — ABNORMAL LOW (ref >60)
Glucose, Bld: 92 mg/dL (ref 70–99)
Potassium: 3.1 mmol/L — ABNORMAL LOW (ref 3.5–5.1)
Sodium: 140 mmol/L (ref 135–145)
Total Bilirubin: 0.8 mg/dL (ref <1.2)
Total Protein: 6.1 g/dL — ABNORMAL LOW (ref 6.5–8.1)

## 2023-10-25 LAB — TSH: TSH: 2.764 u[IU]/mL (ref 0.350–4.500)

## 2023-10-25 MED ORDER — PREDNISONE 10 MG PO TABS: 5.0000 mg | ORAL_TABLET | Freq: Every day | ORAL | 0 refills | Status: AC

## 2023-10-25 NOTE — Patient Instructions (Addendum)
DECREASE Prednisone to 5 mg daily for 2 weeks then stop.  Labs done today, your results will be available in MyChart, we will contact you for abnormal readings.  Your physician has requested that you have an echocardiogram. Echocardiography is a painless test that uses sound waves to create images of your heart. It provides your doctor with information about the size and shape of your heart and how well your heart's chambers and valves are working. This procedure takes approximately one hour. There are no restrictions for this procedure. Please do NOT wear cologne, perfume, aftershave, or lotions (deodorant is allowed). Please arrive 15 minutes prior to your appointment time.  Please note: We ask at that you not bring children with you during ultrasound (echo/ vascular) testing. Due to room size and safety concerns, children are not allowed in the ultrasound rooms during exams. Our front office staff cannot provide observation of children in our lobby area while testing is being conducted. An adult accompanying a patient to their appointment will only be allowed in the ultrasound room at the discretion of the ultrasound technician under special circumstances. We apologize for any inconvenience.       Cardiac Sarcoidosis/Inflammation PET Scan Patient Instructions  Please report to Radiology at the [facility] Main Entrance 15 minutes early for your test.  [address] [place], [place] [address] BRING FOOD DIARY WITH YOU TO THIS APPOINTMENT For 24 hours before the test: Do not exercise! Do not eat after 5 pm the day before your test! To make sure that your scanning results are accurate, you MUST follow the sarcoid prep meal diet starting the day before your PET scan. This diet involves eating no carbohydrates 24 hours before the test.  You will keep a log of all that you eat the day before your test. If you have questions or do not understand this diet, please call [phone]  for more information. If you are unable to follow this diet, please discuss an alternative strategy with the coordinator.  If you are diabetic, continue your diabetes medications as usual on the day before until you begin to fast. NO DIABETES MEDICATIONS ONCE YOU BEGIN TO FAST. What foods can I eat the day before my test?  Drink only water or black coffee (WITHOUT sugar, artificial sweetener, cream, or milk). Eggs (prepared without milk or cheese)  Meat that is either broiled or pan fried in butter WITHOUT breading (chicken, [place], bacon, meat-only sausage, hamburger, steak, fish) Butter, salt & pepper What foods must I AVOID the day before my test?  Do not consume alcoholic beverages, sodas, fruit juice, coffee creamer, or sports drinks  Do not eat vegetables, beans, nuts, fruits, juices, bread, grains, rice, pasta, potatoes, or any baked goods Do not eat dairy products (milk, cheese, etc.)  Do not eat mayonnaise, ketchup, tartar sauce, mustard, or other condiments Do not add sugar, artificial sweeteners, or Splenda (sucralose) to foods or drinks  Do not eat breaded foods (like fried chicken)  Do not eat sweets, candy, gum, sweetened cough drops, lozenges, or sugar  Do not eat sweetened, grilled, or cured meats or meat with carbohydrate-containing additives (some sausages, ham, sweetened bacon) Suggested items for breakfast, lunch, or dinner:  Breakfast  3 to 5 fatty sausage links fried in butter. 3 to 5 bacon strips.  3 eggs pan fried in butter (no milk or cheese).  Lunch/Dinner  2 hamburger patties fried in butter. Chicken or fatty fish pan fried in butter. No  breading. 8 oz. fatty steak pan fried in butter.  Beverages  Drink only water or black coffee. DO NOT ADD SUGAR, ARTIFICIAL SWEETENER, CREAM, OR MILK   For more information and frequently asked questions, please visit our website : [email or web address]   Cardiac Sarcoidosis/Inflammation PET Scan  Food  Diary Name: _____________________________ Please fill in EXACTLY what you have eaten and when for 24 hours PRIOR to your test date.  Time Food/Drink Comments  Breakfast                Lunch                Dinner                Snacks                 DO NOT EXERCISE THE DAY BEFORE YOUR TEST DO NOT EAT AFTER 5 PM THE DAY BEFORE YOUR TEST.  ON THE DAY OF YOUR TEST, DO NOT EAT ANY FOOD AND ONLY DRINK CLEAR WATER! PLEASE BRING THIS FOOD DIARY WITH YOU TO YOUR APPOINTMENT   Your physician recommends that you schedule a follow-up appointment in: 3 months  If you have any questions or concerns before your next appointment please send [place] a message through [place] or call our office at [phone].    TO LEAVE A MESSAGE FOR THE NURSE SELECT OPTION 2, PLEASE LEAVE A MESSAGE INCLUDING: YOUR NAME DATE OF BIRTH CALL BACK NUMBER REASON FOR CALL**this is important as we prioritize the call backs  YOU WILL RECEIVE A CALL BACK THE SAME DAY AS LONG AS YOU CALL BEFORE 4:00 PM  At the [facility], you and your health needs are our priority. As part of our continuing mission to provide you with exceptional heart care, we have created designated Provider Care Teams. These Care Teams include your primary Cardiologist (physician) and Advanced Practice Providers (APPs- Physician Assistants and Nurse Practitioners) who all work together to provide you with the care you need, when you need it.   You may see any of the following providers on your designated Care Team at your next follow up: Dr Arvilla Meres Dr Marca Ancona Dr. Dorthula Nettles Dr. Clearnce Hasten Amy Filbert Schilder, NP Robbie Lis, [facility] [place], [place] Brynda Peon, NP [place] Lee, NP Karle Plumber, PharmD   Please be sure to bring in all your medications bottles to every appointment.    Thank you for choosing [facility]

## 2023-10-26 LAB — ANGIOTENSIN CONVERTING ENZYME: Angiotensin-Converting Enzyme: 124 U/L — ABNORMAL HIGH (ref 14–82)

## 2023-10-26 NOTE — Progress Notes (Signed)
PCP: [name], MD Cardiology: Dr. Mayford Knife EP: Dr. Johney Frame HF cardiology: Dr. Shirlee Latch  62 y.o. with history of CAD, ischemic CMP, and ESRD was referred by Dr. Mayford Knife for evaluation of CHF.  He had PCI in 2005, uncertain what vessel was involved.  He has not had coronary angiography since that time due to advanced CKD.  He has had ischemic cardiomyopathy x years.  Echo in 2/22 showed EF 25-30% with moderate LV dilation and normal RV.  He had VT arrest in 2015, has St Jude subcutaneous ICD.  [name] in 2/22.  LHC/RHC was done in 8/22, showing no significant CAD and stable hemodynamics.   Echo 11/22 EF 25-30%, moderate LV dilation, diffuse hypokinesis, normal RV.   Patient had ICD shock in 6/23 while at HD => VT storm.  He was noted to be volume overloaded.  He was started on amiodarone.    Admitted 9/23 with VT storm. Required intubation with multiple amiodarone boluses. Echo showed EF 20-25%, GIIIDD (restrictive), RV normal. R/LHC showed nonobstructive CAD and elevated right and left filling pressures. Underwent CRRT for volume removal. EP consulted and started on procainamide. Started on prednisone for suspected sarcoid. Mexiletine later added for VT suppression. Able to wean pressors off and transition to iHD for volume removal. Decided on DNR, and ICD deactivated.  Hospitalization c/b PNA and paroxysmal atrial fibrillation. Planned to have cardiac PET at [facility] for cardiac sarcoidosis work up but insurance denied.  Today he returns for HF follow up. He is still taking prednisone 10 mg daily.  He is using his CPAP.  No ICD discharges.  He is taking amiodarone 200 bid and mexiletine 300 mg bid. He saw his optometrist recently and was told that he had some deposits in his eyes from amiodarone. He also has cataracts.  He is short of breath walking up stairs.  No problems walking on flat ground.  No chest pain.  No tachypalpitations or syncope. Weight down 11 lbs.   ECG (personally  reviewed): NSR, LBBB QRS 172 msec  Labs (6/22): LDL 75 Labs (8/22): hgb 13.9 Labs (10/23): K 4.2, mag 2, hgb 10.3 Labs (11/23): hgb 12.9 Labs (2/24): normal LFTs, normal TSH Labs (10/24): BNP 44, hgb 15.4  PMH: 1. CAD: PCI in 2005, uncertain what vessel.  - Cardiolite 2017: Inferior and inferolateral large fixed defect suggestive of prior infarction, EF 28%.  - LHC (8/22): No significant CAD.  2. Chronic systolic CHF: Ischemic cardiomyopathy. St Jude subcutaneous ICD.  - Echo (7/20) with EF 25-30%.  - Echo (10/20) with EF 40-45% - Echo (2/22): EF 25-30%, moderate LV dilation, mild LVH, normal RV size and systolic function, mild MR.  - LHC/RHC (8/22): mean RA 2, PA 24/3, mean PCWP 9, CI 3.13; no significant coronary disease.  - Echo (11/22): EF 25-30%, moderate LV dilation, diffuse hypokinesis, normal RV. - Echo (9/23): EF 20-25%, severe LV dysfunction with global HK, grade III DD, normal RV, mild to moderate MR - R/LHC (9/23): non-obs CAD, elevated R/L pressures with pulmonary venous hypertension. RA mean 14, PA 53/27  (mean 37), PCWP mean 26, CO/CI (Fick) 8.98/3.96, PVR 2.8 WU 3. ESRD since 2/22 4. OSA on CPAP 5. H/o VT arrest: Subcutaneous St Jude ICD [facility] in 2015 at [facility].  - VT storm 6/23, amiodarone started.  - VT storm 9/23, mexiletine started 6. COVID-19 1/22.  7. HTN 8. Hyperlipidemia 9. Recurrent angioedema.  10. ? Sarcoidosis: Hi-Res chest CT (10/23) with no pulmonary findings suggestive  of pulmonary sarcoidosis.  - Started on empiric prednisone. - Cardiac PET so far denied by his insurance.   Social History   Socioeconomic History   Marital status: Single    Spouse name: Not on file   Number of children: 3   Years of education: Not on file   Highest education level: Not on file  Occupational History   Occupation: retired  Tobacco Use   Smoking status: Never   Smokeless tobacco: Never  Vaping Use   Vaping status: Never Used  Substance and Sexual Activity    Alcohol use: No   Drug use: No   Sexual activity: Yes  Other Topics Concern   Not on file  Social History Narrative   ** Merged History Encounter **       Social Determinants of Health   Financial Resource Strain: Low Risk  (02/17/2023)   Overall Financial Resource Strain (CARDIA)    Difficulty of Paying Living Expenses: Not hard at all  Food Insecurity: No Food Insecurity (02/17/2023)   Hunger Vital Sign    Worried About Running Out of Food in the Last Year: Never true    Ran Out of Food in the Last Year: Never true  Transportation Needs: No Transportation Needs (02/17/2023)   PRAPARE - Administrator, Civil Service (Medical): No    Lack of Transportation (Non-Medical): No  Physical Activity: Inactive (02/17/2023)   Exercise Vital Sign    Days of Exercise per Week: 0 days    Minutes of Exercise per Session: 0 min  Stress: No Stress Concern Present (02/17/2023)   [employer] of Occupational Health - Occupational Stress Questionnaire    Feeling of Stress : Only a little  Social Connections: Not on file  Intimate Partner Violence: Not At Risk (08/28/2022)   Humiliation, Afraid, Rape, and Kick questionnaire    Fear of Current or Ex-Partner: No    Emotionally Abused: No    Physically Abused: No    Sexually Abused: No   Family History  Problem Relation Age of Onset   Hypertension Mother    Liver disease Father    Colon cancer Neg Hx    Esophageal cancer Neg Hx    Rectal cancer Neg Hx    Stomach cancer Neg Hx    Colon polyps Neg Hx    ROS: All systems reviewed and negative except as per HPI.   Current Outpatient Medications  Medication Sig Dispense Refill   albuterol (PROVENTIL) (2.5 MG/3ML) 0.083% nebulizer solution INHALE THE CONTENTS OF 1 VIAL BY MOUTH EVERY 6 HOURS AS NEEDED 675 mL 1   albuterol (VENTOLIN HFA) 108 (90 Base) MCG/ACT inhaler INHALE TWO PUFFS BY MOUTH EVERY 4 HOURS AS NEEDED 60.3 g 2   ALLERGY RELIEF CETIRIZINE 10 MG tablet Take 10 mg by  mouth 2 (two) times daily.     AMBULATORY NON FORMULARY MEDICATION Medication Name: diltiazem 2% / lidocaine 5% ointment - pea sized amount PR TID 30 g 1   amiodarone (PACERONE) 200 MG tablet Take 1 tablet (200 mg total) by mouth 2 (two) times daily. 60 tablet 2   apixaban (ELIQUIS) 5 MG TABS tablet NEW PRESCRIPTION REQUEST: Eliquis 5 Mg - TAKE ONE TABLET BY MOUTH TWICE DAILY 180 tablet 3   atorvastatin (LIPITOR) 80 MG tablet Take 1 tablet (80 mg total) by mouth at bedtime. 90 tablet 3   budesonide-formoterol (SYMBICORT) 80-4.5 MCG/ACT inhaler INHALE TWO PUFFS BY MOUTH TWICE DAILY 10.2 g 0   carvedilol (  COREG) 6.25 MG tablet Take 6.25 mg by mouth 2 (two) times daily.     Cholecalciferol 125 MCG (5000 UT) TABS Take 1 tablet (5,000 Units total) by mouth daily. 90 tablet 1   docusate sodium (COLACE) 100 MG capsule TAKE ONE CAPSULE BY MOUTH DAILY 90 capsule 1   EPINEPHrine 0.3 mg/0.3 mL IJ SOAJ injection Inject 0.3 mg into the muscle once as needed for up to 2 doses (if worsening tongue swelling, SOB, hypoxia, or other concerns for progressive anaphylaxis). 2 each 2   ethyl chloride spray SPRAY A SMALL AMOUNT THREE TIMES A WEEK JUST PRIOR TO NEEDLE INSERTION 116 mL 5   fluticasone (FLONASE) 50 MCG/ACT nasal spray INSTILL 1 SPRAY IN EACH NOTRIL DAILY AS NEEDED 15 g 2   hydrOXYzine (ATARAX) 25 MG tablet Take 3 tablets (75 mg total) by mouth at bedtime. 90 tablet 1   lanthanum (FOSRENOL) 1000 MG chewable tablet Chew 2 tablets (2,000 mg total) by mouth 3 (three) times daily AND 1 tablet (1,000 mg total) 2 (two) times daily as needed with snacks 720 tablet 3   linaclotide (LINZESS) 72 MCG capsule TAKE ONE CAPSULE BY MOUTH ON MONDAY, WEDNESDAY, FRIDAY AND SATURDAY 30 capsule 1   mexiletine (MEXITIL) 150 MG capsule Take 2 capsules (300 mg total) by mouth 2 (two) times daily. 360 capsule 3   multivitamin (RENA-VIT) TABS tablet Take 1 tablet by mouth daily.     pantoprazole (PROTONIX) 40 MG tablet NEW  PRESCRIPTION REQUEST: Pantoprazole Sod Dr 40 Mg - TAKE ONE TABLET BY MOUTH DAILY 90 tablet 3   PEG-KCl-NaCl-NaSulf-Na Asc-C (PLENVU) 140 g SOLR Take 1 kit by mouth as directed. 3 each 0   Polyethylene Glycol 3350 (PEG 3350) 17 g PACK NEW PRESCRIPTION REQUEST: Polyethylene Glycol 3350- 1 PACKET BY MOUTH DAILY AS NEEDED 90 packet 0   tadalafil (CIALIS) 10 MG tablet Take 1 tablet (10 mg total) by mouth daily as needed 30 min prior to sexual activity.  Do not take with Imdur (isosorbide) or nitroglycerin 30 tablet 1   predniSONE (DELTASONE) 10 MG tablet Take 0.5 tablets (5 mg total) by mouth daily with breakfast for 14 days. 7 tablet 0   No current facility-administered medications for this encounter.   Wt Readings from Last 3 Encounters:  10/25/23 112.2 kg (247 lb 6.4 oz)  09/28/23 112 kg (247 lb)  09/16/23 113.5 kg (250 lb 3.2 oz)   BP 110/70   Pulse (!) 59   Wt 112.2 kg (247 lb 6.4 oz)   SpO2 96%   BMI 38.75 kg/m  General: NAD Neck: Thick. No JVD, no thyromegaly or thyroid nodule.  Lungs: Clear to auscultation bilaterally with normal respiratory effort. CV: Nondisplaced PMI.  Heart regular S1/S2, no S3/S4, no murmur.  No peripheral edema.  No carotid bruit.  Normal pedal pulses.  Abdomen: Soft, nontender, no hepatosplenomegaly, no distention.  Skin: Intact without lesions or rashes.  Neurologic: Alert and oriented x 3.  Psych: Normal affect. Extremities: No clubbing or cyanosis.  HEENT: Normal.   Assessment/Plan: 1. VT storm: Has has a Science writer ICD.  Admission in 9/23 with refractory monomorphic VT. Multiple shocks.  Later alternating NSR with a slow wide complex AIVR-like rhythm. Recurrent monomorphic VT that same admission w/ repeated shocks>>re-intubated, rebolused with amio, quinidine discontinued, started on procainamide.  Given concern for possible cardiac sarcoid, started empirically on prednisone. HiRes Chest CT this admit with no evidence of pulmonary sarcoid. Cath this admit  with nonobstructive CAD.  Echo 9/23 with EF 20-25%, GIIIDD (restrictive), RV normal. Recurrent VT on 09/07/22 with ICD shock.  He is on amiodarone 400 mg daily + mexiletine 300 bid + Coreg 6.25 mg bid.   - EP has decided against VT ablation, do not think it would be likely to be successful and very high risk.  ICD is now de-activated.  - Continue Coreg 12.5 mg bid, hold BEFORE HD session. - Currently on amiodarone 200 mg bid, but has had some evidence from optometry appointment recently of ocular deposition of amiodarone.  Will talk with EP about decreasing amiodarone to 200 mg daily.  - Continue mexiletine 300 mg bid.  - He is on empiric prednisone for possible cardiac sarcoidosis. He is now on 10 mg daily and off Bactrim. He still has not had cardiac PET as insurance has denied this x 2. Ideally would try to get him off prednisone prior cardiac PET to have best chance of detecting active cardiac sarcoidosis. I will try again to see if we can get cardiac PET done, this time at [facility].  I will check ACE level.  He will decrease prednisone to 5 mg daily x 2 wks then stop.  - Continue EP followup.  2. Chronic systolic CHF: H/o NICM. St Jude subcutaneous ICD.  Echo (9/23) with EF 20-25%, normal RV function, mild-moderate MR, IVC dilated.  In 9/23, unable to complete cardiac MRI due to artifact from [facility] ICD.  PYP scan was negative.  NYHA class II.  - Continue Coreg 12.5 mg bid. Hold before HD. - He has a wide LBBB but has subcutaneous ICD and questionable benefit to change from [facility] ICD to BiV device in dialysis patient.  - I will repeat echo.  3. CAD: ?PCI in 2005, unsure what vessel.  Coronary angiography in 8/22 showed no significant CAD and also does not appear to show prior stent so h/o CAD is questionable. LHC in 9/23 with nonobstructive CAD. No chest pain. - Continue statin. Check lipids today.  - No ASA with anticoagulation.  4. ESRD: Since 2/22. TTS HD.  5. Atrial fibrillation: Paroxysmal.  NSR  today.  - Remains on amiodarone. Check LFTs and TSH.  Concern for ocular deposition of amiodarone, will speak with EP about lowering dose.  - Continue Eliquis.   Follow up in 3 months with APP.   Marca Ancona,  10/26/2023

## 2023-10-27 ENCOUNTER — Other Ambulatory Visit: Payer: Self-pay

## 2023-10-27 ENCOUNTER — Telehealth (HOSPITAL_COMMUNITY): Payer: Self-pay | Admitting: *Deleted

## 2023-10-27 ENCOUNTER — Other Ambulatory Visit (HOSPITAL_COMMUNITY): Payer: Self-pay | Admitting: *Deleted

## 2023-10-27 DIAGNOSIS — D509 Iron deficiency anemia, unspecified: Secondary | ICD-10-CM | POA: Diagnosis not present

## 2023-10-27 DIAGNOSIS — N2581 Secondary hyperparathyroidism of renal origin: Secondary | ICD-10-CM | POA: Diagnosis not present

## 2023-10-27 DIAGNOSIS — D631 Anemia in chronic kidney disease: Secondary | ICD-10-CM | POA: Diagnosis not present

## 2023-10-27 DIAGNOSIS — I5022 Chronic systolic (congestive) heart failure: Secondary | ICD-10-CM

## 2023-10-27 DIAGNOSIS — N186 End stage renal disease: Secondary | ICD-10-CM | POA: Diagnosis not present

## 2023-10-27 DIAGNOSIS — L299 Pruritus, unspecified: Secondary | ICD-10-CM | POA: Diagnosis not present

## 2023-10-27 DIAGNOSIS — Z992 Dependence on renal dialysis: Secondary | ICD-10-CM | POA: Diagnosis not present

## 2023-10-27 DIAGNOSIS — I255 Ischemic cardiomyopathy: Secondary | ICD-10-CM

## 2023-10-27 NOTE — Telephone Encounter (Signed)
Pt returned call and aware of results  

## 2023-10-27 NOTE — Telephone Encounter (Signed)
Called patient per Dr. Shirlee Latch with following lab results and instructions:   "This is elevated.  Needs cardiac PET for sarcoidosis set up."   Pt verbalized agreement to same. Reviewed instructions plus written copies (see below) sent to patient. Radiology will call and schedule test after insurance authorization is obtained.   Cardiac Sarcoidosis/Inflammation PET Scan Patient Instructions  Please report to Radiology at the [facility] Main Entrance 15 minutes early for your test.  [address] [place], [place] [address] BRING FOOD DIARY WITH YOU TO THIS APPOINTMENT For 24 hours before the test: Do not exercise! Do not eat after 5 pm the day before your test! To make sure that your scanning results are accurate, you MUST follow the sarcoid prep meal diet starting the day before your PET scan. This diet involves eating no carbohydrates 24 hours before the test.  You will keep a log of all that you eat the day before your test. If you have questions or do not understand this diet, please call [phone] for more information. If you are unable to follow this diet, please discuss an alternative strategy with the coordinator.  If you are diabetic, continue your diabetes medications as usual on the day before until you begin to fast. NO DIABETES MEDICATIONS ONCE YOU BEGIN TO FAST. What foods can I eat the day before my test?  Drink only water or black coffee (WITHOUT sugar, artificial sweetener, cream, or milk). Eggs (prepared without milk or cheese)  Meat that is either broiled or pan fried in butter WITHOUT breading (chicken, [place], bacon, meat-only sausage, hamburger, steak, fish) Butter, salt & pepper What foods must I AVOID the day before my test?  Do not consume alcoholic beverages, sodas, fruit juice, coffee creamer, or sports drinks  Do not eat vegetables, beans, nuts, fruits, juices, bread, grains, rice, pasta, potatoes, or any baked goods Do not eat dairy products  (milk, cheese, etc.)  Do not eat mayonnaise, ketchup, tartar sauce, mustard, or other condiments Do not add sugar, artificial sweeteners, or Splenda (sucralose) to foods or drinks  Do not eat breaded foods (like fried chicken)  Do not eat sweets, candy, gum, sweetened cough drops, lozenges, or sugar  Do not eat sweetened, grilled, or cured meats or meat with carbohydrate-containing additives (some sausages, ham, sweetened bacon) Suggested items for breakfast, lunch, or dinner:  Breakfast  3 to 5 fatty sausage links fried in butter. 3 to 5 bacon strips.  3 eggs pan fried in butter (no milk or cheese).  Lunch/Dinner  2 hamburger patties fried in butter. Chicken or fatty fish pan fried in butter. No breading. 8 oz. fatty steak pan fried in butter.  Beverages  Drink only water or black coffee. DO NOT ADD SUGAR, ARTIFICIAL SWEETENER, CREAM, OR MILK   For more information and frequently asked questions, please visit our website : [email or web address]    Cardiac Sarcoidosis/Inflammation PET Scan  Food Diary Name: _____________________________ Please fill in EXACTLY what you have eaten and when for 24 hours PRIOR to your test date.  Time Food/Drink Comments  Breakfast                Lunch                Dinner                Snacks                 DO NOT EXERCISE THE DAY BEFORE  YOUR TEST DO NOT EAT AFTER 5 PM THE DAY BEFORE YOUR TEST.  ON THE DAY OF YOUR TEST, DO NOT EAT ANY FOOD AND ONLY DRINK CLEAR WATER! PLEASE BRING THIS FOOD DIARY WITH YOU TO YOUR APPOINTMENT

## 2023-10-28 ENCOUNTER — Other Ambulatory Visit (HOSPITAL_COMMUNITY): Payer: Self-pay | Admitting: Cardiology

## 2023-10-28 ENCOUNTER — Telehealth: Payer: Self-pay | Admitting: *Deleted

## 2023-10-28 ENCOUNTER — Other Ambulatory Visit: Payer: Self-pay | Admitting: Internal Medicine

## 2023-10-28 ENCOUNTER — Telehealth: Payer: Self-pay | Admitting: Internal Medicine

## 2023-10-28 ENCOUNTER — Ambulatory Visit: Payer: Self-pay | Admitting: Internal Medicine

## 2023-10-28 NOTE — Telephone Encounter (Signed)
Copied from CRM [id number]. Topic: General - Inquiry >> Oct 27, 2023  2:34 PM De Blanch wrote: Reason for CRM:Pt requesting to speak with Erskine Squibb. Declined to provide further details. Pt is requesting a callback.   Please advise.

## 2023-10-28 NOTE — Telephone Encounter (Signed)
Call returned to patient: [phone]  , message left with call back requested

## 2023-10-29 DIAGNOSIS — N2581 Secondary hyperparathyroidism of renal origin: Secondary | ICD-10-CM | POA: Diagnosis not present

## 2023-10-29 DIAGNOSIS — N186 End stage renal disease: Secondary | ICD-10-CM | POA: Diagnosis not present

## 2023-10-29 DIAGNOSIS — Z992 Dependence on renal dialysis: Secondary | ICD-10-CM | POA: Diagnosis not present

## 2023-10-29 DIAGNOSIS — D631 Anemia in chronic kidney disease: Secondary | ICD-10-CM | POA: Diagnosis not present

## 2023-10-29 DIAGNOSIS — D509 Iron deficiency anemia, unspecified: Secondary | ICD-10-CM | POA: Diagnosis not present

## 2023-10-29 DIAGNOSIS — L299 Pruritus, unspecified: Secondary | ICD-10-CM | POA: Diagnosis not present

## 2023-10-31 DIAGNOSIS — D509 Iron deficiency anemia, unspecified: Secondary | ICD-10-CM | POA: Diagnosis not present

## 2023-10-31 DIAGNOSIS — D631 Anemia in chronic kidney disease: Secondary | ICD-10-CM | POA: Diagnosis not present

## 2023-10-31 DIAGNOSIS — N186 End stage renal disease: Secondary | ICD-10-CM | POA: Diagnosis not present

## 2023-10-31 DIAGNOSIS — Z992 Dependence on renal dialysis: Secondary | ICD-10-CM | POA: Diagnosis not present

## 2023-10-31 DIAGNOSIS — L299 Pruritus, unspecified: Secondary | ICD-10-CM | POA: Diagnosis not present

## 2023-10-31 DIAGNOSIS — N2581 Secondary hyperparathyroidism of renal origin: Secondary | ICD-10-CM | POA: Diagnosis not present

## 2023-11-01 NOTE — Telephone Encounter (Signed)
I called the patient again and he explained that he has "no taste to eat."  He may chew some food and throw it away. No other symptoms to report.  He said this has been going on for 2 months.  I told him that I would share this information with Dr Laural Benes  I also told him about the [facility] and its location tomorrow- [facility] and the hours it will be there if he wants to be seen by a provider as soon as possible.  He said the has been attending dialysis as scheduled.   I see that he contacted GI about this on 10/17/2023 and they are scheduled to see him 11/23/2023.

## 2023-11-02 ENCOUNTER — Other Ambulatory Visit (HOSPITAL_COMMUNITY): Payer: Self-pay

## 2023-11-02 ENCOUNTER — Ambulatory Visit: Payer: Self-pay

## 2023-11-02 ENCOUNTER — Other Ambulatory Visit: Payer: Self-pay

## 2023-11-02 DIAGNOSIS — D631 Anemia in chronic kidney disease: Secondary | ICD-10-CM | POA: Diagnosis not present

## 2023-11-02 DIAGNOSIS — Z992 Dependence on renal dialysis: Secondary | ICD-10-CM | POA: Diagnosis not present

## 2023-11-02 DIAGNOSIS — D509 Iron deficiency anemia, unspecified: Secondary | ICD-10-CM | POA: Diagnosis not present

## 2023-11-02 DIAGNOSIS — N2581 Secondary hyperparathyroidism of renal origin: Secondary | ICD-10-CM | POA: Diagnosis not present

## 2023-11-02 DIAGNOSIS — N186 End stage renal disease: Secondary | ICD-10-CM | POA: Diagnosis not present

## 2023-11-02 DIAGNOSIS — L299 Pruritus, unspecified: Secondary | ICD-10-CM | POA: Diagnosis not present

## 2023-11-02 NOTE — Telephone Encounter (Signed)
I called patient to inquire if he wants to schedule an appointment with Dr Laural Benes or if he would prefer to wait to see GI. Message left with call back requested.

## 2023-11-02 NOTE — Patient Outreach (Signed)
  Care Coordination   11/02/2023 Name: Victor Thompson WAS MRN: [id number] DOB: 1961/10/24   Care Coordination Outreach Attempts:  An unsuccessful telephone outreach was attempted for a scheduled appointment today.  Follow Up Plan:  Additional outreach attempts will be made to offer the patient care coordination information and services.   Encounter Outcome:  No Answer   Care Coordination Interventions:  No, not indicated    Delsa Sale RN BSN CCM [place]  [facility], [facility] Nurse Care Coordinator  Direct Dial: [phone] Website: [email or web address]

## 2023-11-05 DIAGNOSIS — N186 End stage renal disease: Secondary | ICD-10-CM | POA: Diagnosis not present

## 2023-11-05 DIAGNOSIS — N2581 Secondary hyperparathyroidism of renal origin: Secondary | ICD-10-CM | POA: Diagnosis not present

## 2023-11-05 DIAGNOSIS — Z992 Dependence on renal dialysis: Secondary | ICD-10-CM | POA: Diagnosis not present

## 2023-11-05 DIAGNOSIS — I129 Hypertensive chronic kidney disease with stage 1 through stage 4 chronic kidney disease, or unspecified chronic kidney disease: Secondary | ICD-10-CM | POA: Diagnosis not present

## 2023-11-05 DIAGNOSIS — L299 Pruritus, unspecified: Secondary | ICD-10-CM | POA: Diagnosis not present

## 2023-11-05 DIAGNOSIS — D631 Anemia in chronic kidney disease: Secondary | ICD-10-CM | POA: Diagnosis not present

## 2023-11-05 DIAGNOSIS — D509 Iron deficiency anemia, unspecified: Secondary | ICD-10-CM | POA: Diagnosis not present

## 2023-11-07 NOTE — Telephone Encounter (Signed)
I called the patient and offered to schedule him with Dr Laural Benes or wait to be seen by GI on 11/23/2023.  He said he preferred to keep the appointment with GI.  He went on to say that he is concerned that is he is not able to eat very much and he has lost 4 lbs since October. He said he had loose bowel movements yesterday and did not have any bleeding with the stool.  He feels like something is wrong with his bowels. I encouraged him to call GI and inquire if he can be seen sooner. He said he would call and I text him the phone number for [facility] as he requested.    He also said he has a runny nose, slight cough, no fever and thinks this may be due to allergies.  I explained to him that he can be seen at the [facility] tomorrow if he wanted and I provided him with their address tomorrow but he said he didn't need to go there.   He went on to say that he is mourning the recent loss of his sister. I offered condolences and instructed him to please call back with any questions.

## 2023-11-08 ENCOUNTER — Telehealth (HOSPITAL_COMMUNITY): Payer: Self-pay | Admitting: Cardiology

## 2023-11-08 ENCOUNTER — Ambulatory Visit (INDEPENDENT_AMBULATORY_CARE_PROVIDER_SITE_OTHER): Payer: 59 | Admitting: Podiatry

## 2023-11-08 DIAGNOSIS — M79675 Pain in left toe(s): Secondary | ICD-10-CM

## 2023-11-08 DIAGNOSIS — N2581 Secondary hyperparathyroidism of renal origin: Secondary | ICD-10-CM | POA: Diagnosis not present

## 2023-11-08 DIAGNOSIS — I2510 Atherosclerotic heart disease of native coronary artery without angina pectoris: Secondary | ICD-10-CM

## 2023-11-08 DIAGNOSIS — Z992 Dependence on renal dialysis: Secondary | ICD-10-CM | POA: Diagnosis not present

## 2023-11-08 DIAGNOSIS — D631 Anemia in chronic kidney disease: Secondary | ICD-10-CM | POA: Diagnosis not present

## 2023-11-08 DIAGNOSIS — N186 End stage renal disease: Secondary | ICD-10-CM | POA: Diagnosis not present

## 2023-11-08 DIAGNOSIS — L299 Pruritus, unspecified: Secondary | ICD-10-CM | POA: Diagnosis not present

## 2023-11-08 DIAGNOSIS — D509 Iron deficiency anemia, unspecified: Secondary | ICD-10-CM | POA: Diagnosis not present

## 2023-11-08 DIAGNOSIS — M79674 Pain in right toe(s): Secondary | ICD-10-CM

## 2023-11-08 DIAGNOSIS — B351 Tinea unguium: Secondary | ICD-10-CM

## 2023-11-08 DIAGNOSIS — E785 Hyperlipidemia, unspecified: Secondary | ICD-10-CM

## 2023-11-08 MED ORDER — ATORVASTATIN CALCIUM 80 MG PO TABS: 80.0000 mg | ORAL_TABLET | Freq: Every day | ORAL | 3 refills | Status: DC

## 2023-11-08 NOTE — Progress Notes (Signed)

## 2023-11-10 DIAGNOSIS — D509 Iron deficiency anemia, unspecified: Secondary | ICD-10-CM | POA: Diagnosis not present

## 2023-11-10 DIAGNOSIS — Z992 Dependence on renal dialysis: Secondary | ICD-10-CM | POA: Diagnosis not present

## 2023-11-10 DIAGNOSIS — D631 Anemia in chronic kidney disease: Secondary | ICD-10-CM | POA: Diagnosis not present

## 2023-11-10 DIAGNOSIS — N186 End stage renal disease: Secondary | ICD-10-CM | POA: Diagnosis not present

## 2023-11-10 DIAGNOSIS — N2581 Secondary hyperparathyroidism of renal origin: Secondary | ICD-10-CM | POA: Diagnosis not present

## 2023-11-10 DIAGNOSIS — L299 Pruritus, unspecified: Secondary | ICD-10-CM | POA: Diagnosis not present

## 2023-11-12 DIAGNOSIS — N186 End stage renal disease: Secondary | ICD-10-CM | POA: Diagnosis not present

## 2023-11-12 DIAGNOSIS — D509 Iron deficiency anemia, unspecified: Secondary | ICD-10-CM | POA: Diagnosis not present

## 2023-11-12 DIAGNOSIS — D631 Anemia in chronic kidney disease: Secondary | ICD-10-CM | POA: Diagnosis not present

## 2023-11-12 DIAGNOSIS — Z992 Dependence on renal dialysis: Secondary | ICD-10-CM | POA: Diagnosis not present

## 2023-11-12 DIAGNOSIS — L299 Pruritus, unspecified: Secondary | ICD-10-CM | POA: Diagnosis not present

## 2023-11-12 DIAGNOSIS — N2581 Secondary hyperparathyroidism of renal origin: Secondary | ICD-10-CM | POA: Diagnosis not present

## 2023-11-14 ENCOUNTER — Emergency Department (HOSPITAL_COMMUNITY)
Admission: EM | Admit: 2023-11-14 | Discharge: 2023-11-15 | Discharge status: Against Medical Advice | Payer: 59 | Attending: Emergency Medicine | Admitting: Emergency Medicine

## 2023-11-14 ENCOUNTER — Encounter (HOSPITAL_COMMUNITY): Payer: Self-pay

## 2023-11-14 ENCOUNTER — Encounter: Payer: Self-pay | Admitting: *Deleted

## 2023-11-14 ENCOUNTER — Emergency Department (HOSPITAL_COMMUNITY): Payer: 59

## 2023-11-14 ENCOUNTER — Ambulatory Visit
Admission: EM | Admit: 2023-11-14 | Discharge: 2023-11-14 | Disposition: A | Discharge status: Home / Self Care | Payer: 59

## 2023-11-14 ENCOUNTER — Other Ambulatory Visit: Payer: Self-pay

## 2023-11-14 DIAGNOSIS — Z1152 Encounter for screening for COVID-19: Secondary | ICD-10-CM | POA: Diagnosis not present

## 2023-11-14 DIAGNOSIS — R0602 Shortness of breath: Secondary | ICD-10-CM | POA: Diagnosis not present

## 2023-11-14 DIAGNOSIS — E8770 Fluid overload, unspecified: Secondary | ICD-10-CM

## 2023-11-14 DIAGNOSIS — Z95810 Presence of automatic (implantable) cardiac defibrillator: Secondary | ICD-10-CM | POA: Diagnosis not present

## 2023-11-14 DIAGNOSIS — Z5321 Procedure and treatment not carried out due to patient leaving prior to being seen by health care provider: Secondary | ICD-10-CM | POA: Insufficient documentation

## 2023-11-14 DIAGNOSIS — I5033 Acute on chronic diastolic (congestive) heart failure: Secondary | ICD-10-CM | POA: Diagnosis not present

## 2023-11-14 DIAGNOSIS — R059 Cough, unspecified: Secondary | ICD-10-CM | POA: Diagnosis not present

## 2023-11-14 DIAGNOSIS — R0789 Other chest pain: Secondary | ICD-10-CM | POA: Diagnosis not present

## 2023-11-14 LAB — BASIC METABOLIC PANEL
Anion gap: 12 (ref 5–15)
BUN: 16 mg/dL (ref 6–20)
CO2: 29 mmol/L (ref 22–32)
Calcium: 8.6 mg/dL — ABNORMAL LOW (ref 8.9–10.3)
Chloride: 99 mmol/L (ref 98–111)
Creatinine, Ser: 8.76 mg/dL — ABNORMAL HIGH (ref 0.61–1.24)
GFR, Estimated: 6 mL/min — ABNORMAL LOW (ref >60)
Glucose, Bld: 103 mg/dL — ABNORMAL HIGH (ref 70–99)
Potassium: 3.3 mmol/L — ABNORMAL LOW (ref 3.5–5.1)
Sodium: 140 mmol/L (ref 135–145)

## 2023-11-14 LAB — RESP PANEL BY RT-PCR (RSV, FLU A&B, COVID)  RVPGX2
Influenza A by PCR: NEGATIVE
Influenza B by PCR: NEGATIVE
Resp Syncytial Virus by PCR: NEGATIVE
SARS Coronavirus 2 by RT PCR: NEGATIVE

## 2023-11-14 LAB — CBC
HCT: 39.6 % (ref 39.0–52.0)
Hemoglobin: 13 g/dL (ref 13.0–17.0)
MCH: 30.2 pg (ref 26.0–34.0)
MCHC: 32.8 g/dL (ref 30.0–36.0)
MCV: 91.9 fL (ref 80.0–100.0)
Platelets: 209 10*3/uL (ref 150–400)
RBC: 4.31 MIL/uL (ref 4.22–5.81)
RDW: 16.6 % — ABNORMAL HIGH (ref 11.5–15.5)
WBC: 4.2 10*3/uL (ref 4.0–10.5)
nRBC: 0 % (ref 0.0–0.2)

## 2023-11-14 LAB — BRAIN NATRIURETIC PEPTIDE: B Natriuretic Peptide: 1094 pg/mL — ABNORMAL HIGH (ref 0.0–100.0)

## 2023-11-14 LAB — TROPONIN I (HIGH SENSITIVITY): Troponin I (High Sensitivity): 51 ng/L — ABNORMAL HIGH (ref <18)

## 2023-11-14 NOTE — ED Notes (Signed)
Patient is being discharged from the Urgent Care and sent to the Emergency Department via POV . Per Dorann Ou, PA-C, patient is in need of higher level of care due to SOB. Patient is aware and verbalizes understanding of plan of care.  Vitals:   11/14/23 1908  BP: 113/73  Pulse: 72  Resp: 20  Temp: 98 F (36.7 C)  SpO2: 95%

## 2023-11-14 NOTE — Discharge Instructions (Signed)
Please go to the emergency room for further evaluation and management as we discussed. 

## 2023-11-14 NOTE — ED Provider Notes (Signed)
[facility]    CSN: [id number] Arrival date & time: 11/14/23  1710      History   Chief Complaint Chief Complaint  Patient presents with   Cough    HPI Victor Thompson is a 62 y.o. male.   Patient presents today with a several month history of cough and shortness of breath.  He has not had any fever but does have some mild congestion.  Reports that he has had multiple near syncopal episodes particularly related to coughing fits and feels that his gait is unsteady.  He has tried Mucinex over-the-counter without improvement of symptoms.  He does have a history of CHF and reports that he is weighed regularly when he goes to dialysis.  He has been losing weight and is below his dry weight.  He reports compliance with dialysis and was last dialyzed Saturday and has not missed any treatments.  He is not weighing himself at home.  He reports that he has had difficulty eating because he feels so full even after a few bites.  He has had intense nausea but no vomiting.  He does report some epigastric abdominal pain.  He is requesting an antibiotic.    Past Medical History:  Diagnosis Date   Acute on chronic systolic CHF (congestive heart failure) (HCC) 06/14/2020   Acute respiratory failure (HCC) 05/28/2022   AICD (automatic cardioverter/defibrillator) present    2015   Allergy    Asthma    no meds   Cardiac arrest (HCC) 2015   Chest pain 05/25/2022   CHF (congestive heart failure) (HCC)    Coronary artery disease    ESRD on hemodialysis (HCC)    ckd -stage 5   Hyperlipidemia    Hypertension    Myocardial infarction (HCC)    Obesity    Pneumonia    Shortness of breath dyspnea    Sleep apnea    USES CPAP   Wears glasses     Patient Active Problem List   Diagnosis Date Noted   History of colonic polyps 09/28/2023   Benign neoplasm of colon 09/28/2023   ICD (implantable cardioverter-defibrillator) in place 11/05/2022   Paroxysmal atrial fibrillation (HCC)     Nausea and vomiting    Paroxysmal ventricular tachycardia (HCC) 08/24/2022   VT (ventricular tachycardia) (HCC) 08/16/2022   Chronic obstructive pulmonary disease, unspecified COPD type (HCC) 06/18/2022   AICD discharge 05/25/2022   Seasonal and perennial allergic rhinitis 11/12/2021   Moderate persistent asthma, uncomplicated 11/12/2021   STD exposure 08/27/2021   ESRD on dialysis (HCC) 02/06/2021   Hypervolemia associated with renal insufficiency 01/12/2021   Angioedema 09/22/2020   Secondary hyperparathyroidism of renal origin (HCC) 07/15/2019   Vitamin D deficiency 07/15/2019   CAD (coronary artery disease), native coronary artery 04/10/2019   Chronic midline low back pain without sciatica 02/17/2018   Ventricular tachycardia (HCC) 03/29/2016   Defibrillator discharge    HFrEF (heart failure with reduced ejection fraction) (HCC) 01/22/2016   Erectile dysfunction 01/22/2016   Allergic rhinitis 01/22/2016   GERD (gastroesophageal reflux disease) 01/22/2016   PROTEINURIA 02/17/2010   Dilated cardiomyopathy (HCC) 02/16/2010   Obstructive sleep apnea 02/16/2010   External hemorrhoids 09/26/2009   Tobacco use disorder in remission 06/03/2009   Essential hypertension 10/01/2008   Rectal bleeding 10/01/2008   Hyperlipidemia 07/14/2007   Class 3 severe obesity due to excess calories with serious comorbidity and body mass index (BMI) of 40.0 to 44.9 in adult [facility]) 07/14/2007   Asthma 07/14/2007  Past Surgical History:  Procedure Laterality Date   AV FISTULA PLACEMENT Right 03/15/2019   Procedure: RIGHT ARM ARTERIOVENOUS (AV) FISTULA CREATION;  Surgeon: Chuck Hint, MD;  [facility] OR;  Service: Vascular;  Laterality: Right;   COLONOSCOPY     COLONOSCOPY W/ BIOPSIES AND POLYPECTOMY     COLONOSCOPY WITH PROPOFOL N/A 09/28/2023   Procedure: COLONOSCOPY WITH PROPOFOL;  Surgeon: Benancio Deeds, MD;  Location[facility] ENDOSCOPY;  Service: Gastroenterology;  Laterality: N/A;    CORONARY STENT PLACEMENT  2007   IMPLANTABLE CARDIOVERTER DEFIBRILLATOR IMPLANT  2015   POLYPECTOMY  09/28/2023   Procedure: POLYPECTOMY;  Surgeon: Benancio Deeds, MD;  Location: [facility] ENDOSCOPY;  Service: Gastroenterology;;   RIGHT/LEFT HEART CATH AND CORONARY ANGIOGRAPHY N/A 07/08/2021   Procedure: RIGHT/LEFT HEART CATH AND CORONARY ANGIOGRAPHY;  Surgeon: Laurey Morale, MD;  Location: [facility] INVASIVE CV LAB;  Service: Cardiovascular;  Laterality: N/A;   RIGHT/LEFT HEART CATH AND CORONARY ANGIOGRAPHY N/A 08/25/2022   Procedure: RIGHT/LEFT HEART CATH AND CORONARY ANGIOGRAPHY;  Surgeon: Laurey Morale, MD;  Location: [facility] INVASIVE CV LAB;  Service: Cardiovascular;  Laterality: N/A;   SUBQ ICD CHANGEOUT N/A 10/13/2020   Procedure: SUBQ ICD CHANGEOUT;  Surgeon: Marinus Maw, MD;  Location: [facility] INVASIVE CV LAB;  Service: Cardiovascular;  Laterality: N/A;       Home Medications    Prior to Admission medications   Medication Sig Start Date End Date Taking? Authorizing Provider  albuterol (PROVENTIL) (2.5 MG/3ML) 0.083% nebulizer solution INHALE THE CONTENTS OF 1 VIAL BY MOUTH EVERY 6 HOURS AS NEEDED 10/04/23  Yes [name], MD  albuterol (VENTOLIN HFA) 108 (90 Base) MCG/ACT inhaler INHALE TWO PUFFS BY MOUTH EVERY 4 HOURS AS NEEDED 04/14/23  Yes [name], MD  ALLERGY RELIEF CETIRIZINE 10 MG tablet Take 10 mg by mouth 2 (two) times daily. 08/23/23  Yes [provider]  amiodarone (PACERONE) 200 MG tablet Take 1 tablet (200 mg total) by mouth 2 (two) times daily. 10/03/23  Yes Laurey Morale, MD  apixaban (ELIQUIS) 5 MG TABS tablet NEW PRESCRIPTION REQUEST: Eliquis 5 Mg - TAKE ONE TABLET BY MOUTH TWICE DAILY 10/03/23  Yes Laurey Morale, MD  atorvastatin (LIPITOR) 80 MG tablet Take 1 tablet (80 mg total) by mouth at bedtime. 11/08/23 11/07/24 Yes Laurey Morale, MD  budesonide-formoterol [facility]) 80-4.5 MCG/ACT inhaler INHALE TWO PUFFS BY MOUTH TWICE DAILY 10/31/23   Yes [name], MD  carvedilol (COREG) 6.25 MG tablet Take 6.25 mg by mouth 2 (two) times daily. 07/27/23  Yes [provider]  Cholecalciferol 125 MCG (5000 UT) TABS Take 1 tablet (5,000 Units total) by mouth daily. 12/01/22  Yes [name], MD  docusate sodium (COLACE) 100 MG capsule TAKE ONE CAPSULE BY MOUTH DAILY 07/27/23  Yes [name], MD  EPINEPHrine 0.3 mg/0.3 mL IJ SOAJ injection Inject 0.3 mg into the muscle once as needed for up to 2 doses (if worsening tongue swelling, SOB, hypoxia, or other concerns for progressive anaphylaxis). 12/01/22  Yes [name], MD  ethyl chloride spray SPRAY A SMALL AMOUNT THREE TIMES A WEEK JUST PRIOR TO NEEDLE INSERTION 06/27/23  Yes [name], MD  fluticasone [facility]) 50 MCG/ACT nasal spray INSTILL 1 SPRAY IN EACH NOTRIL DAILY AS NEEDED 10/04/23  Yes [name], MD  hydrOXYzine (ATARAX) 25 MG tablet Take 3 tablets (75 mg total) by mouth at bedtime. 06/27/23  Yes [name], MD  lanthanum (FOSRENOL) 1000 MG chewable tablet Chew 2 tablets (2,000 mg total) by mouth 3 (three) times daily AND 1 tablet (1,000 mg total) 2 (two) times daily as needed with snacks 06/14/23  Yes Anthony Sar, MD  linaclotide [facility]) 72 MCG capsule TAKE ONE CAPSULE BY MOUTH ON MONDAY, WEDNESDAY, FRIDAY AND SATURDAY 10/19/23  Yes [name], MD  mexiletine (MEXITIL) 150 MG capsule Take 2 capsules (300 mg total) by mouth 2 (two) times daily. 10/03/23  Yes Laurey Morale, MD  multivitamin (RENA-VIT) TABS tablet Take 1 tablet by mouth daily. 08/23/23  Yes [provider]  Polyethylene Glycol 3350 (PEG 3350) 17 g PACK NEW PRESCRIPTION REQUEST: Polyethylene Glycol 3350- 1 PACKET BY MOUTH DAILY AS NEEDED 12/01/22  Yes [name], MD  tadalafil (CIALIS) 10 MG tablet Take 1 tablet (10 mg total) by mouth daily as needed 30 min prior to sexual activity.  Do not take with Imdur (isosorbide) or nitroglycerin  02/22/23  Yes   AMBULATORY NON FORMULARY MEDICATION Medication Name: diltiazem 2% / lidocaine 5% ointment - pea sized amount PR TID 09/28/23   Armbruster, Willaim Rayas, MD  pantoprazole (PROTONIX) 40 MG tablet NEW PRESCRIPTION REQUEST: Pantoprazole Sod Dr 40 Mg - TAKE ONE TABLET BY MOUTH DAILY 12/01/22   Laurey Morale, MD  PEG-KCl-NaCl-NaSulf-Na Asc-C (PLENVU) 140 g SOLR Take 1 kit by mouth as directed. 02/15/23   Armbruster, Willaim Rayas, MD    Family History Family History  Problem Relation Age of Onset   Hypertension Mother    Liver disease Father    Colon cancer Neg Hx    Esophageal cancer Neg Hx    Rectal cancer Neg Hx    Stomach cancer Neg Hx    Colon polyps Neg Hx     Social History Social History   Tobacco Use   Smoking status: Never   Smokeless tobacco: Never  Vaping Use   Vaping status: Never Used  Substance Use Topics   Alcohol use: No   Drug use: No     Allergies   Ace inhibitors, Influenza vaccines, Ketorolac, Lidocaine, Lisinopril, and Penicillins   Review of Systems Review of Systems  Constitutional:  Positive for activity change and unexpected weight change. Negative for appetite change, fatigue and fever.  HENT:  Positive for congestion. Negative for sinus pressure, sneezing and sore throat.   Respiratory:  Positive for cough and shortness of breath.   Cardiovascular:  Negative for chest pain.  Gastrointestinal:  Positive for abdominal pain and nausea. Negative for diarrhea and vomiting.  Neurological:  Positive for light-headedness. Negative for headaches.     Physical Exam Triage Vital Signs ED Triage Vitals  Encounter Vitals Group     BP 11/14/23 1908 113/73     Systolic BP Percentile --      Diastolic BP Percentile --      Pulse Rate 11/14/23 1908 72     Resp 11/14/23 1908 20     Temp 11/14/23 1908 98 F (36.7 C)     Temp Source 11/14/23 1908 Oral     SpO2 11/14/23 1908 95 %     Weight --      Height --      Head Circumference --      Peak  Flow --      Pain Score 11/14/23 1901 8     Pain Loc --      Pain Education --      Exclude from Growth Chart --  No data found.  Updated Vital Signs BP 113/73 (BP Location: Left Arm)   Pulse 72   Temp 98 F (36.7 C) (Oral)   Resp 20   SpO2 95%   Visual Acuity Right Eye Distance:   Left Eye Distance:   Bilateral Distance:    Right Eye Near:   Left Eye Near:    Bilateral Near:     Physical Exam Vitals reviewed.  Constitutional:      General: He is awake.     Appearance: Normal appearance. He is well-developed. He is ill-appearing.     Comments: Chronically ill-appearing male in no acute distress  HENT:     Head: Normocephalic and atraumatic.     Mouth/Throat:     Pharynx: No oropharyngeal exudate, posterior oropharyngeal erythema or uvula swelling.  Cardiovascular:     Rate and Rhythm: Normal rate and regular rhythm.     Heart sounds: Normal heart sounds, S1 normal and S2 normal. No murmur heard. Pulmonary:     Effort: Pulmonary effort is normal.     Breath sounds: No stridor. Examination of the right-lower field reveals rales. Examination of the left-lower field reveals rales. Rales present. No wheezing or rhonchi.  Abdominal:     General: Bowel sounds are normal. There is distension.     Palpations: Abdomen is soft. There is fluid wave.     Tenderness: There is abdominal tenderness in the epigastric area.  Neurological:     Mental Status: He is alert.  Psychiatric:        Behavior: Behavior is cooperative.      UC Treatments / Results  Labs (all labs ordered are listed, but only abnormal results are displayed) Labs Reviewed - No data to display  EKG   Radiology No results found.  Procedures Procedures (including critical care time)  Medications Ordered in UC Medications - No data to display  Initial Impression / Assessment and Plan / UC Course  I have reviewed the triage vital signs and the nursing notes.  Pertinent labs & imaging results  that were available during my care of the patient were reviewed by me and considered in my medical decision making (see chart for details).     Concern for acute on chronic CHF with fluid overload contributing to symptoms.  Discussed that the safest thing to do would be to go to the emergency room since we do not have stat lab work.  He was agreeable to this and will go directly to [facility], ER for further evaluation and management.  He was stable at the time of discharge.  Final Clinical Impressions(s) / UC Diagnoses   Final diagnoses:  SOB (shortness of breath)  Hypervolemia, unspecified hypervolemia type  Acute on chronic diastolic CHF (congestive heart failure) [facility])     Discharge Instructions      Please go to the emergency room for further evaluation and management as we discussed     ED Prescriptions   None    PDMP not reviewed this encounter.   Jeani Hawking, PA-C 11/14/23 1936

## 2023-11-14 NOTE — ED Notes (Signed)
Pt says he has dialysis in the morning

## 2023-11-14 NOTE — ED Provider Triage Note (Signed)
Emergency Medicine Provider Triage Evaluation Note  Victor Thompson , a 62 y.o. male  was evaluated in triage.  Pt complains of cough and chest pressure. Started about 1.5 months ago. Cough os productive with green sputum. Chest discomfort is all along the costal margin and worse with coughing. Endorses SOB but no LE edema.   Review of Systems  Positive: See above Negative: See above  Physical Exam  BP 114/72   Pulse 75   Temp 98.1 F (36.7 C)   Resp 18   SpO2 96%  Gen:   Awake, no distress   Resp:  Normal effort  MSK:   Moves extremities without difficulty  Other:    Medical Decision Making  Medically screening exam initiated at 9:15 PM.  Appropriate orders placed.  [name] was informed that the remainder of the evaluation will be completed by another provider, this initial triage assessment does not replace that evaluation, and the importance of remaining in the ED until their evaluation is complete.  Work up started   Gareth Eagle, Cordelia Poche 11/14/23 2117

## 2023-11-14 NOTE — ED Triage Notes (Signed)
Urgent care  " Pt reports since October he hasn't been able to eat. States when he tries to eat his "body rejects it" and he spits it out. Reports he feels his stomach "pushing up" and makes it hard for him to breathe. Reports he has been off balance and passed out last Sunday. Also c/o he "blacks out" when he coughs. States his gait has changed as well due to him being off balance. For 2 weeks he has been having epigastric pain that moves to his stomach. States "I've been holding my stomach in just so I can breathe". Requests antibiotic and something for cough so he can sleep. Pt had dialysis on Saturday and is scheduled for tomorrow "

## 2023-11-14 NOTE — ED Triage Notes (Addendum)
Pt reports since October he hasn't been able to eat. States when he tries to eat his "body rejects it" and he spits it out. Reports he feels his stomach "pushing up" and makes it hard for him to breathe. Reports he has been off balance and passed out last Sunday. Also c/o he "blacks out" when he coughs. States his gait has changed as well due to him being off balance. For 2 weeks he has been having epigastric pain that moves to his stomach. States "I've been holding my stomach in just so I can breathe". Requests antibiotic and something for cough so he can sleep. Pt had dialysis on Saturday and is scheduled for tomorrow

## 2023-11-15 ENCOUNTER — Other Ambulatory Visit: Payer: Self-pay

## 2023-11-15 ENCOUNTER — Other Ambulatory Visit (HOSPITAL_COMMUNITY): Payer: Self-pay

## 2023-11-15 DIAGNOSIS — L299 Pruritus, unspecified: Secondary | ICD-10-CM | POA: Diagnosis not present

## 2023-11-15 DIAGNOSIS — Z992 Dependence on renal dialysis: Secondary | ICD-10-CM | POA: Diagnosis not present

## 2023-11-15 DIAGNOSIS — D509 Iron deficiency anemia, unspecified: Secondary | ICD-10-CM | POA: Diagnosis not present

## 2023-11-15 DIAGNOSIS — N2581 Secondary hyperparathyroidism of renal origin: Secondary | ICD-10-CM | POA: Diagnosis not present

## 2023-11-15 DIAGNOSIS — D631 Anemia in chronic kidney disease: Secondary | ICD-10-CM | POA: Diagnosis not present

## 2023-11-15 DIAGNOSIS — N186 End stage renal disease: Secondary | ICD-10-CM | POA: Diagnosis not present

## 2023-11-15 LAB — TROPONIN I (HIGH SENSITIVITY): Troponin I (High Sensitivity): 50 ng/L — ABNORMAL HIGH (ref <18)

## 2023-11-15 MED ORDER — DOXYCYCLINE HYCLATE 100 MG PO TABS
100.0000 mg | ORAL_TABLET | Freq: Two times a day (BID) | ORAL | 0 refills | Status: DC
  Filled 2023-11-15 (×2): qty 14, 7d supply, fill #0

## 2023-11-15 NOTE — ED Notes (Signed)
Pt has decided to go home

## 2023-11-15 NOTE — ED Notes (Signed)
Pt left stated the wait is too long and he needs his dialysis.

## 2023-11-17 DIAGNOSIS — D631 Anemia in chronic kidney disease: Secondary | ICD-10-CM | POA: Diagnosis not present

## 2023-11-17 DIAGNOSIS — N2581 Secondary hyperparathyroidism of renal origin: Secondary | ICD-10-CM | POA: Diagnosis not present

## 2023-11-17 DIAGNOSIS — Z992 Dependence on renal dialysis: Secondary | ICD-10-CM | POA: Diagnosis not present

## 2023-11-17 DIAGNOSIS — L299 Pruritus, unspecified: Secondary | ICD-10-CM | POA: Diagnosis not present

## 2023-11-17 DIAGNOSIS — N186 End stage renal disease: Secondary | ICD-10-CM | POA: Diagnosis not present

## 2023-11-17 DIAGNOSIS — D509 Iron deficiency anemia, unspecified: Secondary | ICD-10-CM | POA: Diagnosis not present

## 2023-11-18 DIAGNOSIS — D509 Iron deficiency anemia, unspecified: Secondary | ICD-10-CM | POA: Diagnosis not present

## 2023-11-18 DIAGNOSIS — N186 End stage renal disease: Secondary | ICD-10-CM | POA: Diagnosis not present

## 2023-11-18 DIAGNOSIS — Z992 Dependence on renal dialysis: Secondary | ICD-10-CM | POA: Diagnosis not present

## 2023-11-18 DIAGNOSIS — N2581 Secondary hyperparathyroidism of renal origin: Secondary | ICD-10-CM | POA: Diagnosis not present

## 2023-11-18 DIAGNOSIS — L299 Pruritus, unspecified: Secondary | ICD-10-CM | POA: Diagnosis not present

## 2023-11-18 DIAGNOSIS — D631 Anemia in chronic kidney disease: Secondary | ICD-10-CM | POA: Diagnosis not present

## 2023-11-20 NOTE — Progress Notes (Deleted)
Cardiology Office Note:  .   Date:  11/20/2023  ID:  Victor Thompson, DOB [birth year], MRN [id number] PCP: [name], MD  [facility] Providers Cardiologist:  Marca Ancona, MD Electrophysiologist:  Lewayne Bunting, MD {  History of Present Illness: .   Victor Thompson is a 62 y.o. male w/PMHx of CAD (PCI 2007), ICM w/hx of arrest and ICD (at [facility] in [facility]), HTN, HLD, ESRF on HD, chronic CHF (systolic), OSA w/CPAP, morbid obesity, he has h/o seeing/following with pulmonary with episodes of angioedema of unknown trigger that requires epi pen use , AFib, flutter  Sept 2023, hospitalized with VT storm/refractory VT, multiple antiarrhythmic tried, required intubation, sedation, paralyzation >> 10/4 Palliative care consulted due to continued refractory VT. Pt ultimately decided to become DNR/DNI and ICD was deactivated  Suspect sarcoidosis and treatment initiated this stay HF described as end stage  No hospitalizations since  He last saw Dr. Ladona Ridgel 12/14/22, feeling suprisingly well, amioadrone 200mg  BID continued, no changes were made  Has seen Dr. Ladona Ridgel and the HF team several visits out patient,  most recently the HF team, 08/02/23, feeling well, DOE with stairs, his insurance denied cardiac PET x 2. ICD remains deactivated. Using CPAP  No med changes were made.  ER visit 09/07/23 for recurrent, brief palpitations, discharged from the ER  10/2: K+ 4.0 BUN/Creat 26/9.98 Mag 2.2 HS Trop 102 > 97 BNP 44.1 WBC 4.3 H/H 15/48 Plts 188  Pending a colonoscopy, requiring pre-procedure evaluation/visit RCRI score is 3, 11% I saw him 09/16/23, CP is atypical, reproducible today with palpation of his epigastrium, No-obstructive CAD by cath Sept 2023. He confirmed his wish to keep his ICD OFF. Volume managed with HD Elevated CV risk for procedures, though no symptoms to suggest any new cardiac testing needed prior to colonoscopy He declined labs for amiodarone  survellience, asked his HD center get them done  He saw Dr. Shirlee Latch 10/25/23, reported his optometrist noted deposits in his eyes with concerns about his amiodarone, doing well Pending insurance for PET, on steroid for suspect sarcoid, off bactrim Prednisone dose reduced to wean to off Planned for echo and to reach out to EP to reduce amiodarone dose  In c/w Dr. Ladona Ridgel >> to reduce amiodarone to 200mg  daily  Today's visit is scheduled as a 2 mo  ROS:   *** echo is pending *** volume, symptoms *** amio dose? 200daily?? BID?? Any new symptoms *** eliquis, dose, bleeding, labs *** amio labs  Device information BSCi S-ICD, implanted 2015 [facility] + h/o VT, VF and appropriate shocks DEVICE IS OFF at pt request  Arrhythmia/AAD hx Has had hospitalizations with VT storms/refractory VT (last Oct 2023, required intubation and sedation ) Mexiletine added Oct 2023 Not felt to be a candidate for VT ablation (not likely to be successful) Amiodarone started June 2023 REPORTED ALLERGY TO LIDOCAINE Quinidine and procainamide tried oct 2023 both ineffective  Studies Reviewed: Marland Kitchen    EKG not done today  DEVICE interrogation done today and reviewed by myself Device is OFF No data will be available, not interrogated   [facility] 08/25/2022   1st Diag lesion is 60% stenosed.   Ost LAD to Prox LAD lesion is 40% stenosed.   Mid Cx lesion is 30% stenosed.   Prox RCA lesion is 30% stenosed.   1. Elevated right and left heart filling pressures with pulmonary venous hypertension.  2. Nonobstructive CAD.    Echocardiogram 08/25/2022 LVEF 20-25%,  Grade III DD, Mod LAE, mild RAE, mild to mod MR, CVP 15, cannot exclude small PFO.      Risk Assessment/Calculations:    Physical Exam:   VS:  There were no vitals taken for this visit.   Wt Readings from Last 3 Encounters:  10/25/23 247 lb 6.4 oz (112.2 kg)  09/28/23 247 lb (112 kg)  09/16/23 250 lb 3.2 oz (113.5 kg)    GEN: Well  nourished, well developed in no acute distress NECK: No JVD; No carotid bruits CARDIAC: *** RRR, 2/6 SM, no rubs, gallops RESPIRATORY:  *** CTA b/l without rales, wheezing or rhonchi  ABDOMEN: Soft, non-tender, non-distended EXTREMITIES:  *** No edema; No deformity   *** ICD site: is stable, no thinning, fluctuation, tethering  ASSESSMENT AND PLAN: .    VT Long history Numerous AAD tried Current is amiodarone and mexiletine ?lidocaine allergy  ***  ICD Remains off, pt has confirmed this is his wish Inquires about having it removed, though in discussing the procedure today, he decided not to want to have it extracted  CAD *** CP is atypical, reproducible today with palpation of his epigrastrum No-obstructive CAD by cath Sept 2023  ICM Chronic CHF (systolic) Suspect sarcoid Volume management with HD DOE with stairs especially, or longer distances of walking No rest SOB Intermittent bloating, he reports intermittently they have to slow his HD 2/2 low BPs but they are able to complete it    Paroxysmal Afib CHA2DS2Vasc is 3, on eliquis, *** appropriately dosed Intermittent palpitations described as intermittent/brief ?AF vs V ectopy, NSVTs  Secondary hypercoagulable state 2/2 AFib      Dispo: ***  Signed, Sheilah Pigeon, PA-C

## 2023-11-22 ENCOUNTER — Ambulatory Visit: Payer: 59 | Attending: Physician Assistant | Admitting: Physician Assistant

## 2023-11-22 DIAGNOSIS — D509 Iron deficiency anemia, unspecified: Secondary | ICD-10-CM | POA: Diagnosis not present

## 2023-11-22 DIAGNOSIS — D631 Anemia in chronic kidney disease: Secondary | ICD-10-CM | POA: Diagnosis not present

## 2023-11-22 DIAGNOSIS — N2581 Secondary hyperparathyroidism of renal origin: Secondary | ICD-10-CM | POA: Diagnosis not present

## 2023-11-22 DIAGNOSIS — N186 End stage renal disease: Secondary | ICD-10-CM | POA: Diagnosis not present

## 2023-11-22 DIAGNOSIS — L299 Pruritus, unspecified: Secondary | ICD-10-CM | POA: Diagnosis not present

## 2023-11-22 DIAGNOSIS — Z992 Dependence on renal dialysis: Secondary | ICD-10-CM | POA: Diagnosis not present

## 2023-11-23 ENCOUNTER — Encounter: Payer: Self-pay | Admitting: Physician Assistant

## 2023-11-23 ENCOUNTER — Other Ambulatory Visit: Payer: Self-pay | Admitting: Internal Medicine

## 2023-11-23 ENCOUNTER — Ambulatory Visit: Payer: 59 | Admitting: Gastroenterology

## 2023-11-23 NOTE — Telephone Encounter (Signed)
Requested Prescriptions  Pending Prescriptions Disp Refills   budesonide-formoterol (SYMBICORT) 80-4.5 MCG/ACT inhaler [Pharmacy Med Name: Symbicort 80 mcg-4.5 mcg/actuation HFA aerosol inhaler] 10.2 g 11    Sig: INHALE TWO PUFFS BY MOUTH TWICE DAILY. MUST HAVE OFFICE VISIT FOR REFILLS     Pulmonology:  Combination Products Passed - 11/23/2023  2:27 PM      Passed - Valid encounter within last 12 months    Recent Outpatient Visits           4 months ago Chronic combined systolic and diastolic CHF (congestive heart failure) (HCC)   [facility] [place]. [facility] Jonah Blue B, MD   9 months ago Chronic combined systolic and diastolic CHF (congestive heart failure) [facility])   [facility] [place]. [facility] Jonah Blue B, MD   1 year ago Chronic systolic congestive heart failure, NYHA class 2 [facility])   [facility] [facility] [place] [name], MD   1 year ago Hospital discharge follow-up    [facility] [place]. [facility] [name], MD   1 year ago Anxiety about health   [facility], [place], [place]

## 2023-11-24 DIAGNOSIS — N186 End stage renal disease: Secondary | ICD-10-CM | POA: Diagnosis not present

## 2023-11-24 DIAGNOSIS — N2581 Secondary hyperparathyroidism of renal origin: Secondary | ICD-10-CM | POA: Diagnosis not present

## 2023-11-24 DIAGNOSIS — L299 Pruritus, unspecified: Secondary | ICD-10-CM | POA: Diagnosis not present

## 2023-11-24 DIAGNOSIS — D631 Anemia in chronic kidney disease: Secondary | ICD-10-CM | POA: Diagnosis not present

## 2023-11-24 DIAGNOSIS — Z992 Dependence on renal dialysis: Secondary | ICD-10-CM | POA: Diagnosis not present

## 2023-11-24 DIAGNOSIS — D509 Iron deficiency anemia, unspecified: Secondary | ICD-10-CM | POA: Diagnosis not present

## 2023-11-25 ENCOUNTER — Ambulatory Visit (INDEPENDENT_AMBULATORY_CARE_PROVIDER_SITE_OTHER): Payer: 59 | Admitting: Nurse Practitioner

## 2023-11-25 ENCOUNTER — Encounter (HOSPITAL_COMMUNITY): Payer: Self-pay

## 2023-11-25 ENCOUNTER — Encounter: Payer: Self-pay | Admitting: Nurse Practitioner

## 2023-11-25 ENCOUNTER — Ambulatory Visit: Payer: Self-pay | Admitting: *Deleted

## 2023-11-25 ENCOUNTER — Other Ambulatory Visit: Payer: Self-pay

## 2023-11-25 VITALS — BP 113/54 | HR 67 | Temp 96.3°F | Wt 230.0 lb

## 2023-11-25 DIAGNOSIS — R1013 Epigastric pain: Secondary | ICD-10-CM | POA: Diagnosis not present

## 2023-11-25 DIAGNOSIS — K219 Gastro-esophageal reflux disease without esophagitis: Secondary | ICD-10-CM

## 2023-11-25 DIAGNOSIS — R112 Nausea with vomiting, unspecified: Secondary | ICD-10-CM | POA: Diagnosis not present

## 2023-11-25 MED ORDER — ONDANSETRON HCL 4 MG PO TABS
4.0000 mg | ORAL_TABLET | Freq: Three times a day (TID) | ORAL | 1 refills | Status: DC | PRN
  Filled 2023-11-25: qty 20, 7d supply, fill #0

## 2023-11-25 NOTE — Assessment & Plan Note (Addendum)
Encouraged to take pantoprazole 40 mg daily Avoid fatty  fried foods ,spicy foods, alcohol and other foods that triggers abdominal pain

## 2023-11-25 NOTE — Assessment & Plan Note (Addendum)
GERD could be contributing to this We will obtain an abdominal ultrasound and refer patient to GI Encouraged to take pantoprazole 40 mg daily as ordered Encouraged to go to the emergency department if he has pain gets worse

## 2023-11-25 NOTE — Assessment & Plan Note (Addendum)
1. Nausea and vomiting, unspecified vomiting type (Primary)  - ondansetron (ZOFRAN) 4 MG tablet; Take 1 tablet (4 mg total) by mouth every 8 (eight) hours as needed for nausea or vomiting.  Dispense: 20 tablet; Refill: 1  Encouraged to go to the emergency department if he is symptoms gets worse after taking medication

## 2023-11-25 NOTE — Telephone Encounter (Signed)
Reason for Disposition  [1] Constant abdominal pain AND [2] present > 2 hours  Answer Assessment - Initial Assessment Questions 1. VOMITING SEVERITY: "How many times have you vomited in the past 24 hours?"     - MILD:  1 - 2 times/day    - MODERATE: 3 - 5 times/day, decreased oral intake without significant weight loss or symptoms of dehydration    - SEVERE: 6 or more times/day, vomits everything or nearly everything, with significant weight loss, symptoms of dehydration      Last night and today I've been vomiting.   I have not taken my medicine because I don't think I can keep it down. 2. ONSET: "When did the vomiting begin?"      Last night.    My took my heart medicine the other day I vomited.   I'm to see the GI on 12/01/2023.    3. FLUIDS: "What fluids or food have you vomited up today?" "Have you been able to keep any fluids down?"     Everything 4. ABDOMEN PAIN: "Are your having any abdomen pain?" If Yes : "How bad is it and what does it feel like?" (e.g., crampy, dull, intermittent, constant)      My stomach is sore from vomiting and it hurts when I vomit.    5. DIARRHEA: "Is there any diarrhea?" If Yes, ask: "How many times today?"      No diarrhea 6. CONTACTS: "Is there anyone else in the family with the same symptoms?"      Not asked My feces is [place] and black.   7. CAUSE: "What do you think is causing your vomiting?"     I don't know 8. HYDRATION STATUS: "Any signs of dehydration?" (e.g., dry mouth [not only dry lips], too weak to stand) "When did you last urinate?"     Even water makes me vomit 9. OTHER SYMPTOMS: "Do you have any other symptoms?" (e.g., fever, headache, vertigo, vomiting blood or coffee grounds, recent head injury)     Abd pain.    I can keep broth down.   No fever or headaches.  10. PREGNANCY: "Is there any chance you are pregnant?" "When was your last menstrual period?"       N/A  Protocols used: Vomiting-A-AH

## 2023-11-25 NOTE — Patient Instructions (Addendum)
1. Nausea and vomiting, unspecified vomiting type (Primary)  - ondansetron (ZOFRAN) 4 MG tablet; Take 1 tablet (4 mg total) by mouth every 8 (eight) hours as needed for nausea or vomiting.  Dispense: 20 tablet; Refill: 1  2. Epigastric pain  - US Abdomen Complete; Future  Please call this number to schedule [phone]   It is important that you exercise regularly at least 30 minutes 5 times a week as tolerated  Think about what you will eat, plan ahead. Choose " clean, green, fresh or frozen" over canned, processed or packaged foods which are more sugary, salty and fatty. 70 to 75% of food eaten should be vegetables and fruit. Three meals at set times with snacks allowed between meals, but they must be fruit or vegetables. Aim to eat over a 12 hour period , example 7 am to 7 pm, and STOP after  your last meal of the day. Drink water,generally about 64 ounces per day, no other drink is as healthy. Fruit juice is best enjoyed in a healthy way, by EATING the fruit.  Thanks for choosing [facility] we consider it a privelige to serve you.

## 2023-11-25 NOTE — Telephone Encounter (Signed)
Spoke with patient. Patient voiced that he does not feel well. Voiced that he has been vomiting for 3 days and has stomach pain when asked to rate patient voiced pain is light to moderate. Advised patient that he should go to ED or UC. Patient voiced that he is not going to the ED. Patient voiced that after waiting for 13 hours on last Monday . he left without being seen. Patient voiced that wanted an OV and he did care what doctor he seen. Appointment given today at 2:00 pm with [facility].

## 2023-11-25 NOTE — Progress Notes (Signed)
Acute Office Visit  Subjective:     Patient ID: Victor Thompson, male    DOB: 1961/06/17, 62 y.o.   MRN: [id number]  Chief Complaint  Patient presents with   Emesis   Abdominal Pain    Emesis  Associated symptoms include abdominal pain. Pertinent negatives include no arthralgias, chest pain, chills, coughing, dizziness, fever, headaches or myalgias.  Abdominal Pain Associated symptoms include nausea and vomiting. Pertinent negatives include no arthralgias, constipation, dysuria, fever, frequency, headaches or myalgias.   Victor Thompson  has a past medical history of Acute on chronic systolic CHF (congestive heart failure) (HCC) (06/14/2020), Acute respiratory failure (HCC) (05/28/2022), AICD (automatic cardioverter/defibrillator) present, Allergy, Asthma, Cardiac arrest (HCC) (2015), Chest pain (05/25/2022), CHF (congestive heart failure) (HCC), Coronary artery disease, ESRD on hemodialysis (HCC), Hyperlipidemia, Hypertension, Myocardial infarction (HCC), Obesity, Pneumonia, Shortness of breath dyspnea, Sleep apnea, and Wears glasses.   Patient presents with complaints of nausea, vomiting, abdominal pain that started after he had colonoscopy in October 2024.  He denies fever, constipation, diarrhea, bloody stool.  He has not been able to take his medications due to nausea, stated that he has been taking the nausea medication that he got from his family but the medication is not working.  He has been having difficulty taking his medications due to nausea.  He was at the emergency department on 11/14/2023 for complaints of shortness of breath, chest x-ray was normal.  He was recently prescribed ''doxycycline by nephrologist for his stomach .  He is anuric and has been attending dialysis regularly on Tuesday Thursdays and Saturdays.      Review of Systems  Constitutional:  Negative for appetite change, chills, fatigue and fever.  HENT:  Negative for congestion, postnasal drip, rhinorrhea and  sneezing.   Respiratory:  Negative for cough, shortness of breath and wheezing.   Cardiovascular:  Negative for chest pain, palpitations and leg swelling.  Gastrointestinal:  Positive for abdominal pain, nausea and vomiting. Negative for constipation.  Genitourinary:  Negative for difficulty urinating, dysuria, flank pain and frequency.  Musculoskeletal:  Negative for arthralgias, back pain, joint swelling and myalgias.  Skin:  Negative for color change, pallor, rash and wound.  Neurological:  Negative for dizziness, facial asymmetry, weakness, numbness and headaches.  Psychiatric/Behavioral:  Negative for behavioral problems, confusion, self-injury and suicidal ideas.         Objective:    BP (!) 113/54   Pulse 67   Temp (!) 96.3 F (35.7 C)   Wt 230 lb (104.3 kg)   SpO2 99%   BMI 36.02 kg/m    Physical Exam Vitals and nursing note reviewed.  Constitutional:      General: He is not in acute distress.    Appearance: Normal appearance. He is obese. He is not ill-appearing, toxic-appearing or diaphoretic.  HENT:     Mouth/Throat:     Mouth: Mucous membranes are moist.     Pharynx: Oropharynx is clear. No oropharyngeal exudate or posterior oropharyngeal erythema.  Eyes:     General: No scleral icterus.       Right eye: No discharge.        Left eye: No discharge.     Extraocular Movements: Extraocular movements intact.     Conjunctiva/sclera: Conjunctivae normal.  Cardiovascular:     Rate and Rhythm: Normal rate and regular rhythm.     Pulses: Normal pulses.     Heart sounds: Normal heart sounds. No murmur heard.    No friction rub.  No gallop.  Pulmonary:     Effort: Pulmonary effort is normal. No respiratory distress.     Breath sounds: Normal breath sounds. No stridor. No wheezing, rhonchi or rales.  Chest:     Chest wall: No tenderness.  Abdominal:     General: There is no distension.     Palpations: Abdomen is soft.     Tenderness: There is abdominal tenderness.  There is no right CVA tenderness, left CVA tenderness or guarding.     Comments: Epigastric abdominal tenderness  Musculoskeletal:        General: No swelling, tenderness, deformity or signs of injury.     Right lower leg: No edema.     Left lower leg: No edema.  Skin:    General: Skin is warm and dry.     Capillary Refill: Capillary refill takes less than 2 seconds.     Coloration: Skin is not jaundiced or pale.     Findings: No bruising, erythema or lesion.  Neurological:     Mental Status: He is alert and oriented to person, place, and time.     Motor: No weakness.     Coordination: Coordination normal.     Gait: Gait normal.  Psychiatric:        Mood and Affect: Mood normal.        Behavior: Behavior normal.        Thought Content: Thought content normal.        Judgment: Judgment normal.     No results found for any visits on 11/25/23.      Assessment & Plan:   Problem List Items Addressed This Visit       Digestive   GERD (gastroesophageal reflux disease) (Chronic)   Encouraged to take pantoprazole 40 mg daily Avoid fatty  fried foods ,spicy foods, alcohol and other foods that triggers abdominal pain      Relevant Medications   ondansetron (ZOFRAN) 4 MG tablet   Nausea and vomiting - Primary   1. Nausea and vomiting, unspecified vomiting type (Primary)  - ondansetron (ZOFRAN) 4 MG tablet; Take 1 tablet (4 mg total) by mouth every 8 (eight) hours as needed for nausea or vomiting.  Dispense: 20 tablet; Refill: 1  Encouraged to go to the emergency department if he is symptoms gets worse after taking medication       Relevant Medications   ondansetron (ZOFRAN) 4 MG tablet     Other   Epigastric pain   GERD could be contributing to this We will obtain an abdominal ultrasound and refer patient to GI Encouraged to take pantoprazole 40 mg daily as ordered Encouraged to go to the emergency department if he has pain gets worse      Relevant Orders   US  Abdomen Complete   Ambulatory referral to Gastroenterology    Meds ordered this encounter  Medications   ondansetron (ZOFRAN) 4 MG tablet    Sig: Take 1 tablet (4 mg total) by mouth every 8 (eight) hours as needed for nausea or vomiting.    Dispense:  20 tablet    Refill:  1    No follow-ups on file.  Donell Beers, FNP

## 2023-11-25 NOTE — Telephone Encounter (Signed)
  Chief Complaint: Vomiting and not able to take any of his heart medicine. Symptoms: Started last night Frequency: last night   but then he said he has been vomiting for 3 days.   Pertinent Negatives: Patient denie not being able to keep anything down. Disposition: [] ED /[] Urgent Care (no appt availability in office) / [x] Appointment(In office/virtual)/ []  [place] [facility]/ [] Home Care/ [] Refused Recommended Disposition /[] [place] [facility]/ [x]  Follow-up with PCP Additional Notes: No appts available with any providers at [facility].   He went to the ED Sat. And waiting 13 hours then left.   Does not want to return to the ED.   He is worried about not being able to keep his heart medicine down or take it because he is vomiting.   He is asking for "Erskine Squibb".    "She is the nurse with Dr. Laural Benes".    I let him know I would send a high priority message to Dr. Laural Benes and "Erskine Squibb" and have one of them or another nurse call him back.    He was agreeable to this plan.

## 2023-11-26 DIAGNOSIS — D509 Iron deficiency anemia, unspecified: Secondary | ICD-10-CM | POA: Diagnosis not present

## 2023-11-26 DIAGNOSIS — Z992 Dependence on renal dialysis: Secondary | ICD-10-CM | POA: Diagnosis not present

## 2023-11-26 DIAGNOSIS — N186 End stage renal disease: Secondary | ICD-10-CM | POA: Diagnosis not present

## 2023-11-26 DIAGNOSIS — N2581 Secondary hyperparathyroidism of renal origin: Secondary | ICD-10-CM | POA: Diagnosis not present

## 2023-11-26 DIAGNOSIS — D631 Anemia in chronic kidney disease: Secondary | ICD-10-CM | POA: Diagnosis not present

## 2023-11-26 DIAGNOSIS — L299 Pruritus, unspecified: Secondary | ICD-10-CM | POA: Diagnosis not present

## 2023-11-28 DIAGNOSIS — D631 Anemia in chronic kidney disease: Secondary | ICD-10-CM | POA: Diagnosis not present

## 2023-11-28 DIAGNOSIS — D509 Iron deficiency anemia, unspecified: Secondary | ICD-10-CM | POA: Diagnosis not present

## 2023-11-28 DIAGNOSIS — L299 Pruritus, unspecified: Secondary | ICD-10-CM | POA: Diagnosis not present

## 2023-11-28 DIAGNOSIS — Z992 Dependence on renal dialysis: Secondary | ICD-10-CM | POA: Diagnosis not present

## 2023-11-28 DIAGNOSIS — N2581 Secondary hyperparathyroidism of renal origin: Secondary | ICD-10-CM | POA: Diagnosis not present

## 2023-11-28 DIAGNOSIS — N186 End stage renal disease: Secondary | ICD-10-CM | POA: Diagnosis not present

## 2023-11-29 ENCOUNTER — Ambulatory Visit (HOSPITAL_COMMUNITY)
Admission: RE | Admit: 2023-11-29 | Discharge: 2023-11-29 | Disposition: A | Discharge status: Home / Self Care | Payer: 59 | Source: Ambulatory Visit | Attending: Cardiology | Admitting: Cardiology

## 2023-11-29 DIAGNOSIS — E785 Hyperlipidemia, unspecified: Secondary | ICD-10-CM | POA: Insufficient documentation

## 2023-11-29 DIAGNOSIS — I2510 Atherosclerotic heart disease of native coronary artery without angina pectoris: Secondary | ICD-10-CM | POA: Insufficient documentation

## 2023-11-29 DIAGNOSIS — I132 Hypertensive heart and chronic kidney disease with heart failure and with stage 5 chronic kidney disease, or end stage renal disease: Secondary | ICD-10-CM | POA: Diagnosis not present

## 2023-11-29 DIAGNOSIS — N186 End stage renal disease: Secondary | ICD-10-CM | POA: Diagnosis not present

## 2023-11-29 DIAGNOSIS — I509 Heart failure, unspecified: Secondary | ICD-10-CM | POA: Diagnosis not present

## 2023-11-29 DIAGNOSIS — G4730 Sleep apnea, unspecified: Secondary | ICD-10-CM | POA: Diagnosis not present

## 2023-11-29 DIAGNOSIS — I5022 Chronic systolic (congestive) heart failure: Secondary | ICD-10-CM

## 2023-12-01 ENCOUNTER — Ambulatory Visit: Payer: 59 | Admitting: Physician Assistant

## 2023-12-01 ENCOUNTER — Encounter: Payer: Self-pay | Admitting: Physician Assistant

## 2023-12-01 ENCOUNTER — Other Ambulatory Visit (HOSPITAL_COMMUNITY): Payer: Self-pay

## 2023-12-01 ENCOUNTER — Other Ambulatory Visit: Payer: Self-pay

## 2023-12-01 VITALS — BP 138/70 | HR 64 | Ht 67.0 in | Wt 228.0 lb

## 2023-12-01 DIAGNOSIS — N186 End stage renal disease: Secondary | ICD-10-CM | POA: Diagnosis not present

## 2023-12-01 DIAGNOSIS — R112 Nausea with vomiting, unspecified: Secondary | ICD-10-CM

## 2023-12-01 DIAGNOSIS — K5900 Constipation, unspecified: Secondary | ICD-10-CM | POA: Diagnosis not present

## 2023-12-01 DIAGNOSIS — R1013 Epigastric pain: Secondary | ICD-10-CM

## 2023-12-01 DIAGNOSIS — R142 Eructation: Secondary | ICD-10-CM

## 2023-12-01 DIAGNOSIS — D631 Anemia in chronic kidney disease: Secondary | ICD-10-CM | POA: Diagnosis not present

## 2023-12-01 DIAGNOSIS — N2581 Secondary hyperparathyroidism of renal origin: Secondary | ICD-10-CM | POA: Diagnosis not present

## 2023-12-01 DIAGNOSIS — L299 Pruritus, unspecified: Secondary | ICD-10-CM | POA: Diagnosis not present

## 2023-12-01 DIAGNOSIS — Z992 Dependence on renal dialysis: Secondary | ICD-10-CM

## 2023-12-01 DIAGNOSIS — Z7901 Long term (current) use of anticoagulants: Secondary | ICD-10-CM | POA: Diagnosis not present

## 2023-12-01 DIAGNOSIS — K219 Gastro-esophageal reflux disease without esophagitis: Secondary | ICD-10-CM | POA: Diagnosis not present

## 2023-12-01 DIAGNOSIS — D509 Iron deficiency anemia, unspecified: Secondary | ICD-10-CM | POA: Diagnosis not present

## 2023-12-01 LAB — ECHOCARDIOGRAM COMPLETE
AR max vel: 4.13 cm2
AV Area VTI: 3.65 cm2
AV Area mean vel: 3.53 cm2
AV Mean grad: 3 mm[Hg]
AV Peak grad: 5.9 mm[Hg]
Ao pk vel: 1.21 m/s
Area-P 1/2: 3.51 cm2
Calc EF: 37.4 %
S' Lateral: 5.3 cm
Single Plane A2C EF: 34.6 %
Single Plane A4C EF: 39.8 %

## 2023-12-01 MED ORDER — PANTOPRAZOLE SODIUM 40 MG PO TBEC
40.0000 mg | DELAYED_RELEASE_TABLET | Freq: Two times a day (BID) | ORAL | 3 refills | Status: DC
  Filled 2023-12-01: qty 180, 90d supply, fill #0

## 2023-12-01 MED ORDER — ONDANSETRON HCL 4 MG PO TABS
4.0000 mg | ORAL_TABLET | Freq: Three times a day (TID) | ORAL | 1 refills | Status: DC
  Filled 2023-12-01: qty 90, 30d supply, fill #0

## 2023-12-01 NOTE — Progress Notes (Signed)
Chief Complaint: Appetite loss  HPI:    Victor Thompson is a 62 year old male with a past medical history as listed below including acute on chronic systolic CHF, CHF and ESRD on HD, known to Dr. Adela Lank, who was referred to me by [name], MD for a complaint of appetite loss.      09/28/2023 colonoscopy with anal fissure, 1 diminutive polyp in the ascending colon, stool throughout the colon leading to extensive lavage with adequate views, internal hemorrhoids and anal papula, recommended Diltiazem ointment as well as suppositories for hemorrhoids.  Repeat recommended 3 years.    11/14/2023 ER visit for shortness of breath, chest x-ray normal.  Prescribed Doxycycline.  BNP 1094 at that time.  BMP with a potassium low at 3.3.  CBC normal.    11/25/2023 patient seen by PCP for nausea and vomiting.  At that time discussed abdominal pain.  Apparently all of this started after colonoscopy in October.  At that time encouraged to take Pantoprazole 40 mg daily and given Zofran 4 mg.  They ordered an abdominal ultrasound referred to [place].    Patient scheduled for abdominal ultrasound tomorrow.    Today, patient presents to clinic and tells me that really ever since time of his colonoscopy in October he has had issues with a lot of belching and some reflux as well as epigastric discomfort and occasional vomiting along with an appetite loss.  Tells me he typically can only eat a couple of bites of food at a time and due to this has lost around 30 pounds since October.  Currently using Pantoprazole 40 mg daily and Zofran 4 mg typically twice daily as needed for symptoms.  Does tell me though that he seemed to eat a fair amount of food yesterday and today and has not had issues.    Denies fever, chills or change in bowel habits.  Past Medical History:  Diagnosis Date   Acute on chronic systolic CHF (congestive heart failure) (HCC) 06/14/2020   Acute respiratory failure (HCC) 05/28/2022   AICD (automatic  cardioverter/defibrillator) present    2015   Allergy    Asthma    no meds   Cardiac arrest (HCC) 2015   Chest pain 05/25/2022   CHF (congestive heart failure) (HCC)    Coronary artery disease    ESRD on hemodialysis (HCC)    ckd -stage 5   Hyperlipidemia    Hypertension    Myocardial infarction (HCC)    Obesity    Pneumonia    Shortness of breath dyspnea    Sleep apnea    USES CPAP   Wears glasses     Past Surgical History:  Procedure Laterality Date   AV FISTULA PLACEMENT Right 03/15/2019   Procedure: RIGHT ARM ARTERIOVENOUS (AV) FISTULA CREATION;  Surgeon: Chuck Hint, MD;  Location: [facility] OR;  Service: Vascular;  Laterality: Right;   COLONOSCOPY     COLONOSCOPY W/ BIOPSIES AND POLYPECTOMY     COLONOSCOPY WITH PROPOFOL N/A 09/28/2023   Procedure: COLONOSCOPY WITH PROPOFOL;  Surgeon: Benancio Deeds, MD;  Location: [facility] ENDOSCOPY;  Service: Gastroenterology;  Laterality: N/A;   CORONARY STENT PLACEMENT  2007   IMPLANTABLE CARDIOVERTER DEFIBRILLATOR IMPLANT  2015   POLYPECTOMY  09/28/2023   Procedure: POLYPECTOMY;  Surgeon: Benancio Deeds, MD;  Location: [facility] ENDOSCOPY;  Service: Gastroenterology;;   RIGHT/LEFT HEART CATH AND CORONARY ANGIOGRAPHY N/A 07/08/2021   Procedure: RIGHT/LEFT HEART CATH AND CORONARY ANGIOGRAPHY;  Surgeon: Laurey Morale, MD;  Location: [facility] INVASIVE CV LAB;  Service: Cardiovascular;  Laterality: N/A;   RIGHT/LEFT HEART CATH AND CORONARY ANGIOGRAPHY N/A 08/25/2022   Procedure: RIGHT/LEFT HEART CATH AND CORONARY ANGIOGRAPHY;  Surgeon: Laurey Morale, MD;  Location: [facility] INVASIVE CV LAB;  Service: Cardiovascular;  Laterality: N/A;   SUBQ ICD CHANGEOUT N/A 10/13/2020   Procedure: SUBQ ICD CHANGEOUT;  Surgeon: Marinus Maw, MD;  Location: [facility] INVASIVE CV LAB;  Service: Cardiovascular;  Laterality: N/A;    Current Outpatient Medications  Medication Sig Dispense Refill   albuterol (PROVENTIL) (2.5 MG/3ML) 0.083% nebulizer solution INHALE THE  CONTENTS OF 1 VIAL BY MOUTH EVERY 6 HOURS AS NEEDED 675 mL 1   albuterol (VENTOLIN HFA) 108 (90 Base) MCG/ACT inhaler INHALE TWO PUFFS BY MOUTH EVERY 4 HOURS AS NEEDED 60.3 g 2   ALLERGY RELIEF CETIRIZINE 10 MG tablet Take 10 mg by mouth 2 (two) times daily.     AMBULATORY NON FORMULARY MEDICATION Medication Name: diltiazem 2% / lidocaine 5% ointment - pea sized amount PR TID 30 g 1   amiodarone (PACERONE) 200 MG tablet Take 1 tablet (200 mg total) by mouth 2 (two) times daily. 60 tablet 2   apixaban (ELIQUIS) 5 MG TABS tablet NEW PRESCRIPTION REQUEST: Eliquis 5 Mg - TAKE ONE TABLET BY MOUTH TWICE DAILY 180 tablet 3   atorvastatin (LIPITOR) 80 MG tablet Take 1 tablet (80 mg total) by mouth at bedtime. 90 tablet 3   budesonide-formoterol (SYMBICORT) 80-4.5 MCG/ACT inhaler INHALE TWO PUFFS BY MOUTH TWICE DAILY. 10.2 g 0   carvedilol (COREG) 6.25 MG tablet Take 6.25 mg by mouth 2 (two) times daily.     Cholecalciferol 125 MCG (5000 UT) TABS Take 1 tablet (5,000 Units total) by mouth daily. 90 tablet 1   docusate sodium (COLACE) 100 MG capsule TAKE ONE CAPSULE BY MOUTH DAILY 90 capsule 1   doxycycline (VIBRA-TABS) 100 MG tablet Take 1 tablet (100 mg total) by mouth 2 (two) times daily as directed 14 tablet 0   EPINEPHrine 0.3 mg/0.3 mL IJ SOAJ injection Inject 0.3 mg into the muscle once as needed for up to 2 doses (if worsening tongue swelling, SOB, hypoxia, or other concerns for progressive anaphylaxis). 2 each 2   ethyl chloride spray SPRAY A SMALL AMOUNT THREE TIMES A WEEK JUST PRIOR TO NEEDLE INSERTION 116 mL 5   fluticasone (FLONASE) 50 MCG/ACT nasal spray INSTILL 1 SPRAY IN EACH NOTRIL DAILY AS NEEDED 15 g 2   hydrOXYzine (ATARAX) 25 MG tablet Take 3 tablets (75 mg total) by mouth at bedtime. 90 tablet 1   lanthanum (FOSRENOL) 1000 MG chewable tablet Chew 2 tablets (2,000 mg total) by mouth 3 (three) times daily AND 1 tablet (1,000 mg total) 2 (two) times daily as needed with snacks 720 tablet 3    linaclotide (LINZESS) 72 MCG capsule TAKE ONE CAPSULE BY MOUTH ON MONDAY, WEDNESDAY, FRIDAY AND SATURDAY 30 capsule 1   mexiletine (MEXITIL) 150 MG capsule Take 2 capsules (300 mg total) by mouth 2 (two) times daily. 360 capsule 3   multivitamin (RENA-VIT) TABS tablet Take 1 tablet by mouth daily.     ondansetron (ZOFRAN) 4 MG tablet Take 1 tablet (4 mg total) by mouth every 8 (eight) hours as needed for nausea or vomiting. 20 tablet 1   pantoprazole (PROTONIX) 40 MG tablet NEW PRESCRIPTION REQUEST: Pantoprazole Sod Dr 40 Mg - TAKE ONE TABLET BY MOUTH DAILY 90 tablet 3   Polyethylene Glycol 3350 (PEG 3350) 17 g  PACK NEW PRESCRIPTION REQUEST: Polyethylene Glycol 3350- 1 PACKET BY MOUTH DAILY AS NEEDED 90 packet 0   tadalafil (CIALIS) 10 MG tablet Take 1 tablet (10 mg total) by mouth daily as needed 30 min prior to sexual activity.  Do not take with Imdur (isosorbide) or nitroglycerin 30 tablet 1   No current facility-administered medications for this visit.    Allergies as of 12/01/2023 - Review Complete 12/01/2023  Allergen Reaction Noted   Ace inhibitors Swelling 03/04/2016   Influenza vaccines Hives and Swelling 09/13/2017   Ketorolac Swelling 09/13/2017   Lidocaine Swelling 04/09/2017   Lisinopril Swelling 07/17/2007   Penicillins Swelling 04/09/2017    Family History  Problem Relation Age of Onset   Hypertension Mother    Liver disease Father    Colon cancer Neg Hx    Esophageal cancer Neg Hx    Rectal cancer Neg Hx    Stomach cancer Neg Hx    Colon polyps Neg Hx     Social History   Socioeconomic History   Marital status: Single    Spouse name: Not on file   Number of children: 3   Years of education: Not on file   Highest education level: Not on file  Occupational History   Occupation: retired  Tobacco Use   Smoking status: Never   Smokeless tobacco: Never  Vaping Use   Vaping status: Never Used  Substance and Sexual Activity   Alcohol use: No   Drug use: No    Sexual activity: Yes  Other Topics Concern   Not on file  Social History Narrative   ** Merged History Encounter **       Social Drivers of Health   Financial Resource Strain: Low Risk  (02/17/2023)   Overall Financial Resource Strain (CARDIA)    Difficulty of Paying Living Expenses: Not hard at all  Food Insecurity: No Food Insecurity (02/17/2023)   Hunger Vital Sign    Worried About Running Out of Food in the Last Year: Never true    Ran Out of Food in the Last Year: Never true  Transportation Needs: No Transportation Needs (02/17/2023)   PRAPARE - Administrator, Civil Service (Medical): No    Lack of Transportation (Non-Medical): No  Physical Activity: Inactive (02/17/2023)   Exercise Vital Sign    Days of Exercise per Week: 0 days    Minutes of Exercise per Session: 0 min  Stress: No Stress Concern Present (02/17/2023)   [employer] of Occupational Health - Occupational Stress Questionnaire    Feeling of Stress : Only a little  Social Connections: Not on file  Intimate Partner Violence: Not At Risk (08/28/2022)   Humiliation, Afraid, Rape, and Kick questionnaire    Fear of Current or Ex-Partner: No    Emotionally Abused: No    Physically Abused: No    Sexually Abused: No    Review of Systems:    Constitutional: No fever or chills Cardiovascular: No chest pain, chest pressure or palpitations   Respiratory: No SOB or cough Gastrointestinal: See HPI and otherwise negative   Physical Exam:  Vital signs: BP 138/70   Pulse 64   Ht 5\' 7"  (1.702 m)   Wt 228 lb (103.4 kg)   SpO2 92%   BMI 35.71 kg/m    Constitutional:   Pleasant obese AA male appears to be in NAD, Well developed, Well nourished, alert and cooperative Respiratory: Respirations even and unlabored. Lungs clear to auscultation bilaterally.  No wheezes, crackles, or rhonchi.  Cardiovascular: Normal S1, S2. No MRG. Regular rate and rhythm. No peripheral edema, cyanosis or pallor.   Gastrointestinal:  Soft, nondistended, moderate epigastric TTP. No rebound or guarding. Normal bowel sounds. No appreciable masses or hepatomegaly. Psychiatric:  Demonstrates good judgement and reason without abnormal affect or behaviors.  RELEVANT LABS AND IMAGING: CBC    Component Value Date/Time   WBC 4.2 11/14/2023 2129   RBC 4.31 11/14/2023 2129   HGB 13.0 11/14/2023 2129   HGB 12.0 (L) 09/08/2021 1450   HCT 39.6 11/14/2023 2129   HCT 35.4 (L) 09/08/2021 1450   PLT 209 11/14/2023 2129   PLT 179 10/10/2020 0925   MCV 91.9 11/14/2023 2129   MCV 95 09/08/2021 1450   MCH 30.2 11/14/2023 2129   MCHC 32.8 11/14/2023 2129   RDW 16.6 (H) 11/14/2023 2129   RDW 14.2 09/08/2021 1450   LYMPHSABS 0.6 (L) 09/29/2022 0136   LYMPHSABS 0.8 09/08/2021 1450   MONOABS 0.8 09/29/2022 0136   EOSABS 0.0 09/29/2022 0136   EOSABS 0.3 09/08/2021 1450   BASOSABS 0.0 09/29/2022 0136   BASOSABS 0.0 09/08/2021 1450    CMP     Component Value Date/Time   NA 140 11/14/2023 2129   NA 142 09/08/2021 1450   K 3.3 (L) 11/14/2023 2129   CL 99 11/14/2023 2129   CO2 29 11/14/2023 2129   GLUCOSE 103 (H) 11/14/2023 2129   BUN 16 11/14/2023 2129   BUN 25 (H) 09/08/2021 1450   CREATININE 8.76 (H) 11/14/2023 2129   CREATININE 1.97 (H) 03/15/2016 1223   CALCIUM 8.6 (L) 11/14/2023 2129   PROT 6.1 (L) 10/25/2023 1126   PROT 7.3 09/08/2021 1450   ALBUMIN 2.8 (L) 10/25/2023 1126   ALBUMIN 4.4 09/08/2021 1450   AST 75 (H) 10/25/2023 1126   ALT 60 (H) 10/25/2023 1126   ALKPHOS 75 10/25/2023 1126   BILITOT 0.8 10/25/2023 1126   BILITOT 0.3 09/08/2021 1450   GFRNONAA 6 (L) 11/14/2023 2129   GFRNONAA 36 (L) 01/22/2016 1031   GFRAA 6 (L) 10/10/2020 0925   GFRAA 42 (L) 01/22/2016 1031    Assessment: 1.  Decrease in appetite and weight loss: With below 2.  GERD/nausea/vomiting: For the past 2 months now since time of colonoscopy per patient, not sure that is related, sounds like early satiety within  appetite loss as above and a weight loss of around 20 pounds; consider gastritis +/- H. pylori versus other 3.  ESRD on HD 4.  CAD on Eliquis 5.  Chronic constipation: Controlled with Linzess 72 daily and Dulcolax  Plan: 1.  Increase Pantoprazole to 40 mg twice daily, 30-60 minutes before breakfast and dinner.  #60 with 5 refills. 2.  Recommend the patient schedule out his Zofran 4 mg 20 to 30 minutes before meals.  Prescribed #90 with 3 refills. 3.  Provided the patient with a GERD handout and discussed antireflux diet and lifestyle modifications. 4.  Ordered a [employer] H. pylori stool test 5.  Patient to follow-up in clinic in 1 to 2 months.  I will also send a note to my nurse to check on him in 2 weeks.  If no better with medication above could consider an EGD.  He is on dialysis and this would need to be done at the hospital.  Would also need to hold Eliquis for 2 days.  Hyacinth Meeker, PA-C [facility] 12/01/2023, 9:35 AM  Cc: [name], MD

## 2023-12-01 NOTE — Patient Instructions (Addendum)
We have sent the following medications to your pharmacy for you to pick up at your convenience: Pantoprazole 40 mg twice daily 30-60 minutes before breakfast and dinner. Zofran 4 mg 20-30 minutes before meals as needed.   Your provider has ordered "[employer]" stool testing for you. You have received a kit from our office today containing all necessary supplies to complete this test. Please carefully read the stool collection instructions provided in the kit before opening the accompanying materials. In addition, be sure to place the label from the top right corner of the laboratory request sheet onto the "puritan opti-swab" tube that is supplied in the kit. This label should include your full name and date of birth. After completing the test, you should secure the purtian tube into the specimen biohazard bag. The laboratory request information sheet (including date and time of specimen collection) should be placed into the outside pocket of the specimen biohazard bag and returned to the [facility] lab with 2 days of collection.   If the laboratory information sheet specimen date and time are not filled out, the test will NOT be performed.  _______________________________________________________  If your blood pressure at your visit was 140/90 or greater, please contact your primary care physician to follow up on this.  _______________________________________________________  If you are age 80 or older, your body mass index should be between 23-30. Your Body mass index is 35.71 kg/m. If this is out of the aforementioned range listed, please consider follow up with your Primary Care Provider.  If you are age 51 or younger, your body mass index should be between 19-25. Your Body mass index is 35.71 kg/m. If this is out of the aformentioned range listed, please consider follow up with your Primary Care Provider.   ________________________________________________________  The [facility] providers would  like to encourage you to use [facility] to communicate with providers for non-urgent requests or questions.  Due to long hold times on the telephone, sending your provider a message by [facility] may be a faster and more efficient way to get a response.  Please allow 48 business hours for a response.  Please remember that this is for non-urgent requests.  _______________________________________________________

## 2023-12-02 ENCOUNTER — Other Ambulatory Visit: Payer: Self-pay

## 2023-12-02 ENCOUNTER — Ambulatory Visit
Admission: RE | Admit: 2023-12-02 | Discharge: 2023-12-02 | Disposition: A | Discharge status: Home / Self Care | Payer: 59 | Source: Ambulatory Visit | Attending: Nurse Practitioner

## 2023-12-02 DIAGNOSIS — R112 Nausea with vomiting, unspecified: Secondary | ICD-10-CM | POA: Diagnosis not present

## 2023-12-02 DIAGNOSIS — R1013 Epigastric pain: Secondary | ICD-10-CM | POA: Diagnosis not present

## 2023-12-02 DIAGNOSIS — K219 Gastro-esophageal reflux disease without esophagitis: Secondary | ICD-10-CM | POA: Diagnosis not present

## 2023-12-02 DIAGNOSIS — R142 Eructation: Secondary | ICD-10-CM | POA: Diagnosis not present

## 2023-12-02 NOTE — Progress Notes (Signed)
Agree with assessment plan as outlined.  Hopefully he improves with conservative measures.  If not we can consider an upper endoscopy however he is higher than average risk for anesthesia given his comorbidities.

## 2023-12-03 DIAGNOSIS — N2581 Secondary hyperparathyroidism of renal origin: Secondary | ICD-10-CM | POA: Diagnosis not present

## 2023-12-03 DIAGNOSIS — D631 Anemia in chronic kidney disease: Secondary | ICD-10-CM | POA: Diagnosis not present

## 2023-12-03 DIAGNOSIS — D509 Iron deficiency anemia, unspecified: Secondary | ICD-10-CM | POA: Diagnosis not present

## 2023-12-03 DIAGNOSIS — L299 Pruritus, unspecified: Secondary | ICD-10-CM | POA: Diagnosis not present

## 2023-12-03 DIAGNOSIS — N186 End stage renal disease: Secondary | ICD-10-CM | POA: Diagnosis not present

## 2023-12-03 DIAGNOSIS — Z992 Dependence on renal dialysis: Secondary | ICD-10-CM | POA: Diagnosis not present

## 2023-12-05 ENCOUNTER — Other Ambulatory Visit: Payer: Self-pay

## 2023-12-05 ENCOUNTER — Ambulatory Visit: Payer: Self-pay

## 2023-12-05 DIAGNOSIS — Z992 Dependence on renal dialysis: Secondary | ICD-10-CM | POA: Diagnosis not present

## 2023-12-05 DIAGNOSIS — D509 Iron deficiency anemia, unspecified: Secondary | ICD-10-CM | POA: Diagnosis not present

## 2023-12-05 DIAGNOSIS — D631 Anemia in chronic kidney disease: Secondary | ICD-10-CM | POA: Diagnosis not present

## 2023-12-05 DIAGNOSIS — N186 End stage renal disease: Secondary | ICD-10-CM | POA: Diagnosis not present

## 2023-12-05 DIAGNOSIS — L299 Pruritus, unspecified: Secondary | ICD-10-CM | POA: Diagnosis not present

## 2023-12-05 DIAGNOSIS — N2581 Secondary hyperparathyroidism of renal origin: Secondary | ICD-10-CM | POA: Diagnosis not present

## 2023-12-05 NOTE — Telephone Encounter (Signed)
  Chief Complaint: abdominal pain Symptoms: lower abdominal pain, intermittent, bloating, nausea Frequency: since Oct  Pertinent Negatives: Patient denies vomiting  Disposition: [] ED /[] Urgent Care (no appt availability in office) / [] Appointment(In office/virtual)/ []  [facility]/ [] Home Care/ [x] Refused Recommended Disposition /[] [facility]/ []  Follow-up with PCP Additional Notes: pt states he is needing an emergent appt with PCP d/t his sx. Not getting any better. Has recently lost 30 lbs in 1 month, having nausea and poor appetite and intermittent pain. I advised pt if he needing to be seen Emer[facility] go to ED. He states last time he went on 11/14/23 he waited 12 hrs and wasn't seen. Reviewed ED visit, advised him several labs were abnormal and may be best to go to ED. Pt states he would rather be seen in office. Scheduled OV for 12/29/23 and added to wait list. Advised him if sx get worse to please go to ED.    Reason for Disposition  [1] MODERATE pain (e.g., interferes with normal activities) AND [2] pain comes and goes (cramps) AND [3] present > 24 hours  (Exception: Pain with Vomiting or Diarrhea - see that Guideline.)  Answer Assessment - Initial Assessment Questions 1. LOCATION: "Where does it hurt?"      Lower abdominal  3. ONSET: "When did the pain begin?" (Minutes, hours or days ago)      Since Oct  5. PATTERN "Does the pain come and go, or is it constant?"    - If it comes and goes: "How long does it last?" "Do you have pain now?"     (Note: Comes and goes means the pain is intermittent. It goes away completely between bouts.)    - If constant: "Is it getting better, staying the same, or getting worse?"      (Note: Constant means the pain never goes away completely; most serious pain is constant and gets worse.)      Comes and goes  6. SEVERITY: "How bad is the pain?"  (e.g., Scale 1-10; mild, moderate, or severe)    - MILD (1-3): Doesn't interfere with  normal activities, abdomen soft and not tender to touch.     - MODERATE (4-7): Interferes with normal activities or awakens from sleep, abdomen tender to touch.     - SEVERE (8-10): Excruciating pain, doubled over, unable to do any normal activities.       Mild to moderate  9. RELIEVING/AGGRAVATING FACTORS: "What makes it better or worse?" (e.g., antacids, bending or twisting motion, bowel movement)     Eating or lying down on 1 side  10. OTHER SYMPTOMS: "Do you have any other symptoms?" (e.g., back pain, diarrhea, fever, urination pain, vomiting)       Nausea and bloating  Protocols used: Abdominal Pain - Male-A-AH

## 2023-12-06 DIAGNOSIS — I129 Hypertensive chronic kidney disease with stage 1 through stage 4 chronic kidney disease, or unspecified chronic kidney disease: Secondary | ICD-10-CM | POA: Diagnosis not present

## 2023-12-06 DIAGNOSIS — Z992 Dependence on renal dialysis: Secondary | ICD-10-CM | POA: Diagnosis not present

## 2023-12-06 DIAGNOSIS — N186 End stage renal disease: Secondary | ICD-10-CM | POA: Diagnosis not present

## 2023-12-06 NOTE — Telephone Encounter (Signed)
Noted  

## 2023-12-08 ENCOUNTER — Other Ambulatory Visit (HOSPITAL_COMMUNITY): Payer: Self-pay | Admitting: Cardiology

## 2023-12-09 ENCOUNTER — Encounter: Payer: Self-pay | Admitting: *Deleted

## 2023-12-09 ENCOUNTER — Ambulatory Visit: Payer: Self-pay | Admitting: *Deleted

## 2023-12-09 NOTE — Patient Outreach (Addendum)
 Care Coordination   Follow Up Visit Note   12/09/2023 Name: DAVIYON Thompson MRN: [id number] DOB: Feb 24, 1961  Victor Thompson is a 63 y.o. year old male who sees [name], MD for primary care. I spoke with  [name] by phone today.   What matters to the patients health and wellness today?  Finding out the cause of recurrent diffuse lower abdominal pain with N/V/D and weight loss of 30 lbs over the past month.     Goals Addressed             This Visit's Progress    Care Coordination Needs       Care Management Goals: Patient will talk with PCP office regarding CT results Patient will talk with PCP office regarding management of abdominal pain, N/V/D, and weight loss Patient will F/U with gastroenterologist sooner than scheduled for current GI symptoms Patient will seek medical attention as needed for any new or worsening symptoms Patient will keep telephone follow-up with RN Care Manager on 12/13/23 Patient will take zofran  as prescribed for nausea        SDOH assessments and interventions completed:  Yes  SDOH Interventions Today    Flowsheet Row Most Recent Value  SDOH Interventions   Transportation Interventions Intervention Not Indicated  Financial Strain Interventions Intervention Not Indicated        Care Coordination Interventions:  Yes, provided  Interventions Today    Flowsheet Row Most Recent Value  Chronic Disease   Chronic disease during today's visit Congestive Heart Failure (CHF), Chronic Kidney Disease/End Stage Renal Disease (ESRD), Other  [Abdominal pain, N/V/D, weight loss]  General Interventions   General Interventions Discussed/Reviewed General Interventions Discussed, General Interventions Reviewed, Doctor Visits, Communication with  Doctor Visits Discussed/Reviewed Doctor Visits Discussed, Doctor Visits Reviewed, PCP, Specialist  PCP/Specialist Visits Compliance with follow-up visit  [Follow-up with PCP Re: results of ultrasound and  recurrent abdominal pain, N/V/D, and weight loss. Follow-up with GI is scheduled for 02/22/24 but should likely follow-up sooner given these symptoms.]  Communication with RN, PCP/Specialists  luiz message to Clayborne Ly, RN Care Manager with update. Staff message to PCP office triage pool Re: current symptoms and need for management instructions and ultrasound results. Staff message to [facility] Re: current symptoms]  Exercise Interventions   Exercise Discussed/Reviewed Weight Managment  Weight Management Weight maintenance  [self reports weight loss of 30 lbs over the past month. Weight is checked 3 days a week at dialysis.]  Education Interventions   Education Provided Provided Education  Provided Verbal Education On Nutrition, When to see the doctor, Applications, Medication  [facility] emergency medical attention if needed, otherwise Urgent Care is recommended for new or worsening symptoms that do not present as an emergency. Talk with employer and PCP about FMLA application. Works part-time at [employer].]  Nutrition Interventions   Nutrition Discussed/Reviewed Nutrition Discussed, Nutrition Reviewed, Supplemental nutrition, Fluid intake  [decreased appetite due to N/V]  Pharmacy Interventions   Pharmacy Dicussed/Reviewed Pharmacy Topics Discussed, Pharmacy Topics Reviewed, Medications and their functions  [taking medications as prescribed, including zofran  for nausea. States that this does help some but does not completely resolve nausea]  Safety Interventions   Safety Discussed/Reviewed Safety Discussed, Safety Reviewed       Follow up plan: Follow up call scheduled for 12/13/23 with Clayborne Ly, RN Care Manager    Encounter Outcome:  Patient Visit Completed   Josette Pellet, RN, BSN Care Manager [facility]  Direct Dial: [phone] Main #: [phone]

## 2023-12-10 ENCOUNTER — Emergency Department (HOSPITAL_BASED_OUTPATIENT_CLINIC_OR_DEPARTMENT_OTHER): Payer: 59

## 2023-12-10 ENCOUNTER — Other Ambulatory Visit: Payer: Self-pay

## 2023-12-10 ENCOUNTER — Inpatient Hospital Stay (HOSPITAL_BASED_OUTPATIENT_CLINIC_OR_DEPARTMENT_OTHER)
Admission: EM | Admit: 2023-12-10 | Discharge: 2024-01-07 | DRG: 438 | Disposition: E | Payer: 59 | Attending: Pulmonary Disease | Admitting: Pulmonary Disease

## 2023-12-10 ENCOUNTER — Encounter (HOSPITAL_BASED_OUTPATIENT_CLINIC_OR_DEPARTMENT_OTHER): Payer: Self-pay

## 2023-12-10 DIAGNOSIS — R6521 Severe sepsis with septic shock: Secondary | ICD-10-CM | POA: Diagnosis not present

## 2023-12-10 DIAGNOSIS — J439 Emphysema, unspecified: Secondary | ICD-10-CM | POA: Diagnosis present

## 2023-12-10 DIAGNOSIS — K317 Polyp of stomach and duodenum: Secondary | ICD-10-CM | POA: Diagnosis not present

## 2023-12-10 DIAGNOSIS — R590 Localized enlarged lymph nodes: Secondary | ICD-10-CM | POA: Diagnosis not present

## 2023-12-10 DIAGNOSIS — G928 Other toxic encephalopathy: Secondary | ICD-10-CM | POA: Diagnosis not present

## 2023-12-10 DIAGNOSIS — I5043 Acute on chronic combined systolic (congestive) and diastolic (congestive) heart failure: Secondary | ICD-10-CM | POA: Diagnosis not present

## 2023-12-10 DIAGNOSIS — K8500 Idiopathic acute pancreatitis without necrosis or infection: Secondary | ICD-10-CM

## 2023-12-10 DIAGNOSIS — I2089 Other forms of angina pectoris: Secondary | ICD-10-CM

## 2023-12-10 DIAGNOSIS — K828 Other specified diseases of gallbladder: Secondary | ICD-10-CM | POA: Diagnosis not present

## 2023-12-10 DIAGNOSIS — R7989 Other specified abnormal findings of blood chemistry: Secondary | ICD-10-CM | POA: Diagnosis not present

## 2023-12-10 DIAGNOSIS — A419 Sepsis, unspecified organism: Secondary | ICD-10-CM | POA: Diagnosis not present

## 2023-12-10 DIAGNOSIS — I5042 Chronic combined systolic (congestive) and diastolic (congestive) heart failure: Secondary | ICD-10-CM | POA: Diagnosis present

## 2023-12-10 DIAGNOSIS — E861 Hypovolemia: Secondary | ICD-10-CM | POA: Diagnosis not present

## 2023-12-10 DIAGNOSIS — I132 Hypertensive heart and chronic kidney disease with heart failure and with stage 5 chronic kidney disease, or end stage renal disease: Secondary | ICD-10-CM | POA: Diagnosis present

## 2023-12-10 DIAGNOSIS — J9601 Acute respiratory failure with hypoxia: Secondary | ICD-10-CM | POA: Diagnosis not present

## 2023-12-10 DIAGNOSIS — Z992 Dependence on renal dialysis: Secondary | ICD-10-CM | POA: Diagnosis not present

## 2023-12-10 DIAGNOSIS — K3189 Other diseases of stomach and duodenum: Secondary | ICD-10-CM | POA: Diagnosis not present

## 2023-12-10 DIAGNOSIS — N2581 Secondary hyperparathyroidism of renal origin: Secondary | ICD-10-CM | POA: Diagnosis present

## 2023-12-10 DIAGNOSIS — I5022 Chronic systolic (congestive) heart failure: Secondary | ICD-10-CM | POA: Diagnosis not present

## 2023-12-10 DIAGNOSIS — R531 Weakness: Secondary | ICD-10-CM | POA: Diagnosis not present

## 2023-12-10 DIAGNOSIS — Z792 Long term (current) use of antibiotics: Secondary | ICD-10-CM | POA: Diagnosis not present

## 2023-12-10 DIAGNOSIS — K7200 Acute and subacute hepatic failure without coma: Secondary | ICD-10-CM | POA: Diagnosis not present

## 2023-12-10 DIAGNOSIS — I447 Left bundle-branch block, unspecified: Secondary | ICD-10-CM | POA: Diagnosis present

## 2023-12-10 DIAGNOSIS — D631 Anemia in chronic kidney disease: Secondary | ICD-10-CM | POA: Diagnosis present

## 2023-12-10 DIAGNOSIS — Z8679 Personal history of other diseases of the circulatory system: Secondary | ICD-10-CM | POA: Diagnosis not present

## 2023-12-10 DIAGNOSIS — I2510 Atherosclerotic heart disease of native coronary artery without angina pectoris: Secondary | ICD-10-CM | POA: Diagnosis present

## 2023-12-10 DIAGNOSIS — Z1152 Encounter for screening for COVID-19: Secondary | ICD-10-CM | POA: Diagnosis not present

## 2023-12-10 DIAGNOSIS — C779 Secondary and unspecified malignant neoplasm of lymph node, unspecified: Secondary | ICD-10-CM | POA: Diagnosis present

## 2023-12-10 DIAGNOSIS — E43 Unspecified severe protein-calorie malnutrition: Secondary | ICD-10-CM | POA: Diagnosis present

## 2023-12-10 DIAGNOSIS — J449 Chronic obstructive pulmonary disease, unspecified: Secondary | ICD-10-CM | POA: Diagnosis not present

## 2023-12-10 DIAGNOSIS — E871 Hypo-osmolality and hyponatremia: Secondary | ICD-10-CM | POA: Diagnosis not present

## 2023-12-10 DIAGNOSIS — J4520 Mild intermittent asthma, uncomplicated: Secondary | ICD-10-CM | POA: Diagnosis not present

## 2023-12-10 DIAGNOSIS — Z6834 Body mass index (BMI) 34.0-34.9, adult: Secondary | ICD-10-CM

## 2023-12-10 DIAGNOSIS — I472 Ventricular tachycardia, unspecified: Secondary | ICD-10-CM | POA: Diagnosis present

## 2023-12-10 DIAGNOSIS — K869 Disease of pancreas, unspecified: Secondary | ICD-10-CM | POA: Diagnosis not present

## 2023-12-10 DIAGNOSIS — E876 Hypokalemia: Secondary | ICD-10-CM | POA: Diagnosis not present

## 2023-12-10 DIAGNOSIS — C787 Secondary malignant neoplasm of liver and intrahepatic bile duct: Secondary | ICD-10-CM | POA: Diagnosis present

## 2023-12-10 DIAGNOSIS — I5040 Unspecified combined systolic (congestive) and diastolic (congestive) heart failure: Secondary | ICD-10-CM | POA: Diagnosis present

## 2023-12-10 DIAGNOSIS — R079 Chest pain, unspecified: Secondary | ICD-10-CM

## 2023-12-10 DIAGNOSIS — K2289 Other specified disease of esophagus: Secondary | ICD-10-CM | POA: Diagnosis not present

## 2023-12-10 DIAGNOSIS — Z887 Allergy status to serum and vaccine status: Secondary | ICD-10-CM

## 2023-12-10 DIAGNOSIS — G8929 Other chronic pain: Secondary | ICD-10-CM | POA: Diagnosis present

## 2023-12-10 DIAGNOSIS — K449 Diaphragmatic hernia without obstruction or gangrene: Secondary | ICD-10-CM | POA: Diagnosis not present

## 2023-12-10 DIAGNOSIS — Z66 Do not resuscitate: Secondary | ICD-10-CM | POA: Diagnosis not present

## 2023-12-10 DIAGNOSIS — Z955 Presence of coronary angioplasty implant and graft: Secondary | ICD-10-CM

## 2023-12-10 DIAGNOSIS — Z8719 Personal history of other diseases of the digestive system: Secondary | ICD-10-CM

## 2023-12-10 DIAGNOSIS — J45909 Unspecified asthma, uncomplicated: Secondary | ICD-10-CM | POA: Diagnosis present

## 2023-12-10 DIAGNOSIS — E785 Hyperlipidemia, unspecified: Secondary | ICD-10-CM | POA: Diagnosis present

## 2023-12-10 DIAGNOSIS — G4733 Obstructive sleep apnea (adult) (pediatric): Secondary | ICD-10-CM | POA: Diagnosis present

## 2023-12-10 DIAGNOSIS — K859 Acute pancreatitis without necrosis or infection, unspecified: Secondary | ICD-10-CM | POA: Diagnosis not present

## 2023-12-10 DIAGNOSIS — I469 Cardiac arrest, cause unspecified: Secondary | ICD-10-CM | POA: Diagnosis not present

## 2023-12-10 DIAGNOSIS — N186 End stage renal disease: Secondary | ICD-10-CM | POA: Diagnosis present

## 2023-12-10 DIAGNOSIS — I5084 End stage heart failure: Secondary | ICD-10-CM | POA: Diagnosis present

## 2023-12-10 DIAGNOSIS — K8591 Acute pancreatitis with uninfected necrosis, unspecified: Secondary | ICD-10-CM | POA: Diagnosis present

## 2023-12-10 DIAGNOSIS — Z884 Allergy status to anesthetic agent status: Secondary | ICD-10-CM

## 2023-12-10 DIAGNOSIS — Z95810 Presence of automatic (implantable) cardiac defibrillator: Secondary | ICD-10-CM

## 2023-12-10 DIAGNOSIS — I9589 Other hypotension: Secondary | ICD-10-CM | POA: Diagnosis not present

## 2023-12-10 DIAGNOSIS — K861 Other chronic pancreatitis: Secondary | ICD-10-CM | POA: Diagnosis not present

## 2023-12-10 DIAGNOSIS — K2970 Gastritis, unspecified, without bleeding: Secondary | ICD-10-CM | POA: Diagnosis not present

## 2023-12-10 DIAGNOSIS — Z79899 Other long term (current) drug therapy: Secondary | ICD-10-CM

## 2023-12-10 DIAGNOSIS — Z515 Encounter for palliative care: Secondary | ICD-10-CM | POA: Diagnosis not present

## 2023-12-10 DIAGNOSIS — R109 Unspecified abdominal pain: Secondary | ICD-10-CM

## 2023-12-10 DIAGNOSIS — R933 Abnormal findings on diagnostic imaging of other parts of digestive tract: Secondary | ICD-10-CM | POA: Diagnosis not present

## 2023-12-10 DIAGNOSIS — Z7901 Long term (current) use of anticoagulants: Secondary | ICD-10-CM

## 2023-12-10 DIAGNOSIS — I255 Ischemic cardiomyopathy: Secondary | ICD-10-CM | POA: Diagnosis present

## 2023-12-10 DIAGNOSIS — R0602 Shortness of breath: Secondary | ICD-10-CM | POA: Diagnosis present

## 2023-12-10 DIAGNOSIS — N179 Acute kidney failure, unspecified: Secondary | ICD-10-CM | POA: Diagnosis not present

## 2023-12-10 DIAGNOSIS — R0600 Dyspnea, unspecified: Secondary | ICD-10-CM

## 2023-12-10 DIAGNOSIS — I959 Hypotension, unspecified: Secondary | ICD-10-CM

## 2023-12-10 DIAGNOSIS — K2990 Gastroduodenitis, unspecified, without bleeding: Secondary | ICD-10-CM | POA: Diagnosis present

## 2023-12-10 DIAGNOSIS — I480 Paroxysmal atrial fibrillation: Secondary | ICD-10-CM | POA: Diagnosis present

## 2023-12-10 DIAGNOSIS — Z7951 Long term (current) use of inhaled steroids: Secondary | ICD-10-CM

## 2023-12-10 DIAGNOSIS — R627 Adult failure to thrive: Secondary | ICD-10-CM | POA: Diagnosis not present

## 2023-12-10 DIAGNOSIS — K8689 Other specified diseases of pancreas: Principal | ICD-10-CM | POA: Diagnosis present

## 2023-12-10 DIAGNOSIS — I252 Old myocardial infarction: Secondary | ICD-10-CM

## 2023-12-10 DIAGNOSIS — Z8249 Family history of ischemic heart disease and other diseases of the circulatory system: Secondary | ICD-10-CM

## 2023-12-10 DIAGNOSIS — I428 Other cardiomyopathies: Secondary | ICD-10-CM | POA: Diagnosis present

## 2023-12-10 DIAGNOSIS — K219 Gastro-esophageal reflux disease without esophagitis: Secondary | ICD-10-CM | POA: Diagnosis present

## 2023-12-10 DIAGNOSIS — Z880 Allergy status to penicillin: Secondary | ICD-10-CM

## 2023-12-10 DIAGNOSIS — R935 Abnormal findings on diagnostic imaging of other abdominal regions, including retroperitoneum: Secondary | ICD-10-CM

## 2023-12-10 DIAGNOSIS — Z888 Allergy status to other drugs, medicaments and biological substances status: Secondary | ICD-10-CM

## 2023-12-10 DIAGNOSIS — D649 Anemia, unspecified: Secondary | ICD-10-CM | POA: Diagnosis not present

## 2023-12-10 DIAGNOSIS — Z7189 Other specified counseling: Secondary | ICD-10-CM | POA: Diagnosis not present

## 2023-12-10 LAB — BASIC METABOLIC PANEL
Anion gap: 10 (ref 5–15)
BUN: 7 mg/dL (ref 6–20)
CO2: 32 mmol/L (ref 22–32)
Calcium: 7.9 mg/dL — ABNORMAL LOW (ref 8.9–10.3)
Chloride: 97 mmol/L — ABNORMAL LOW (ref 98–111)
Creatinine, Ser: 4.33 mg/dL — ABNORMAL HIGH (ref 0.61–1.24)
GFR, Estimated: 15 mL/min — ABNORMAL LOW (ref >60)
Glucose, Bld: 90 mg/dL (ref 70–99)
Potassium: 3 mmol/L — ABNORMAL LOW (ref 3.5–5.1)
Sodium: 139 mmol/L (ref 135–145)

## 2023-12-10 LAB — CBC
HCT: 36.5 % — ABNORMAL LOW (ref 39.0–52.0)
Hemoglobin: 12.2 g/dL — ABNORMAL LOW (ref 13.0–17.0)
MCH: 29.5 pg (ref 26.0–34.0)
MCHC: 33.4 g/dL (ref 30.0–36.0)
MCV: 88.4 fL (ref 80.0–100.0)
Platelets: 240 10*3/uL (ref 150–400)
RBC: 4.13 MIL/uL — ABNORMAL LOW (ref 4.22–5.81)
RDW: 17 % — ABNORMAL HIGH (ref 11.5–15.5)
WBC: 4.3 10*3/uL (ref 4.0–10.5)
nRBC: 0 % (ref 0.0–0.2)

## 2023-12-10 LAB — HEPATIC FUNCTION PANEL
ALT: 183 U/L — ABNORMAL HIGH (ref 0–44)
AST: 249 U/L — ABNORMAL HIGH (ref 15–41)
Albumin: 2.2 g/dL — ABNORMAL LOW (ref 3.5–5.0)
Alkaline Phosphatase: 83 U/L (ref 38–126)
Bilirubin, Direct: 0.2 mg/dL (ref 0.0–0.2)
Indirect Bilirubin: 0.7 mg/dL (ref 0.3–0.9)
Total Bilirubin: 0.9 mg/dL (ref 0.0–1.2)
Total Protein: 5.6 g/dL — ABNORMAL LOW (ref 6.5–8.1)

## 2023-12-10 LAB — RESP PANEL BY RT-PCR (RSV, FLU A&B, COVID)  RVPGX2
Influenza A by PCR: NEGATIVE
Influenza B by PCR: NEGATIVE
Resp Syncytial Virus by PCR: NEGATIVE
SARS Coronavirus 2 by RT PCR: NEGATIVE

## 2023-12-10 LAB — BRAIN NATRIURETIC PEPTIDE: B Natriuretic Peptide: 368.3 pg/mL — ABNORMAL HIGH (ref 0.0–100.0)

## 2023-12-10 LAB — LIPASE, BLOOD: Lipase: 40 U/L (ref 11–51)

## 2023-12-10 LAB — TROPONIN I (HIGH SENSITIVITY): Troponin I (High Sensitivity): 43 ng/L — ABNORMAL HIGH (ref <18)

## 2023-12-10 MED ORDER — DOCUSATE SODIUM 100 MG PO CAPS
100.0000 mg | ORAL_CAPSULE | Freq: Two times a day (BID) | ORAL | Status: DC
  Administered 2023-12-10 – 2023-12-26 (×24): 100 mg via ORAL
  Filled 2023-12-10 (×29): qty 1

## 2023-12-10 MED ORDER — ONDANSETRON HCL 4 MG/2ML IJ SOLN
4.0000 mg | Freq: Four times a day (QID) | INTRAMUSCULAR | Status: DC | PRN
  Administered 2023-12-14 – 2023-12-18 (×4): 4 mg via INTRAVENOUS
  Filled 2023-12-10 (×3): qty 2

## 2023-12-10 MED ORDER — LEVALBUTEROL HCL 0.63 MG/3ML IN NEBU: 0.6300 mg | INHALATION_SOLUTION | Freq: Four times a day (QID) | RESPIRATORY_TRACT | Status: DC | PRN

## 2023-12-10 MED ORDER — SODIUM CHLORIDE 0.9 % IV SOLN: 250.0000 mL | INTRAVENOUS | Status: AC | PRN

## 2023-12-10 MED ORDER — SODIUM CHLORIDE 0.9% FLUSH
3.0000 mL | Freq: Two times a day (BID) | INTRAVENOUS | Status: DC
  Administered 2023-12-10 – 2023-12-25 (×28): 3 mL via INTRAVENOUS

## 2023-12-10 MED ORDER — ONDANSETRON HCL 4 MG PO TABS
4.0000 mg | ORAL_TABLET | Freq: Four times a day (QID) | ORAL | Status: DC | PRN
  Administered 2023-12-12 – 2023-12-14 (×3): 4 mg via ORAL
  Filled 2023-12-10 (×3): qty 1

## 2023-12-10 MED ORDER — APIXABAN 5 MG PO TABS
5.0000 mg | ORAL_TABLET | Freq: Two times a day (BID) | ORAL | Status: DC
  Administered 2023-12-11 – 2023-12-12 (×2): 5 mg via ORAL
  Filled 2023-12-10 (×2): qty 1

## 2023-12-10 MED ORDER — ACETAMINOPHEN 650 MG RE SUPP
650.0000 mg | Freq: Four times a day (QID) | RECTAL | Status: DC | PRN
Start: 2023-12-10 — End: 2023-12-28

## 2023-12-10 MED ORDER — PANTOPRAZOLE SODIUM 40 MG PO TBEC
40.0000 mg | DELAYED_RELEASE_TABLET | Freq: Two times a day (BID) | ORAL | Status: DC
  Administered 2023-12-10 – 2023-12-26 (×27): 40 mg via ORAL
  Filled 2023-12-10 (×32): qty 1

## 2023-12-10 MED ORDER — SODIUM CHLORIDE 0.9% FLUSH: 3.0000 mL | INTRAVENOUS | Status: DC | PRN

## 2023-12-10 MED ORDER — MEXILETINE HCL 150 MG PO CAPS
300.0000 mg | ORAL_CAPSULE | Freq: Two times a day (BID) | ORAL | Status: DC
  Administered 2023-12-10 – 2023-12-27 (×27): 300 mg via ORAL
  Filled 2023-12-10 (×37): qty 2

## 2023-12-10 MED ORDER — AMIODARONE HCL 200 MG PO TABS
200.0000 mg | ORAL_TABLET | Freq: Two times a day (BID) | ORAL | Status: DC
  Administered 2023-12-10 – 2023-12-27 (×27): 200 mg via ORAL
  Filled 2023-12-10 (×31): qty 1

## 2023-12-10 MED ORDER — ATORVASTATIN CALCIUM 80 MG PO TABS
80.0000 mg | ORAL_TABLET | Freq: Every day | ORAL | Status: DC
  Administered 2023-12-10 – 2023-12-17 (×8): 80 mg via ORAL
  Filled 2023-12-10 (×8): qty 1

## 2023-12-10 MED ORDER — ACETAMINOPHEN 325 MG PO TABS
650.0000 mg | ORAL_TABLET | Freq: Four times a day (QID) | ORAL | Status: DC | PRN
  Administered 2023-12-11 – 2023-12-25 (×10): 650 mg via ORAL
  Filled 2023-12-10 (×12): qty 2

## 2023-12-10 MED ORDER — FLUTICASONE FUROATE-VILANTEROL 100-25 MCG/ACT IN AEPB
1.0000 | INHALATION_SPRAY | Freq: Every day | RESPIRATORY_TRACT | Status: DC
  Administered 2023-12-14 – 2023-12-27 (×7): 1 via RESPIRATORY_TRACT
  Filled 2023-12-10: qty 28

## 2023-12-10 MED ORDER — IOHEXOL 350 MG/ML SOLN
100.0000 mL | Freq: Once | INTRAVENOUS | Status: AC | PRN
  Administered 2023-12-10: 80 mL via INTRAVENOUS

## 2023-12-10 MED ORDER — APIXABAN 5 MG PO TABS: 5.0000 mg | ORAL_TABLET | Freq: Two times a day (BID) | ORAL | Status: DC

## 2023-12-10 MED ORDER — ISOSORB DINITRATE-HYDRALAZINE 20-37.5 MG PO TABS: 1.0000 | ORAL_TABLET | Freq: Three times a day (TID) | ORAL | Status: DC

## 2023-12-10 MED ORDER — ACETAMINOPHEN 325 MG PO TABS
650.0000 mg | ORAL_TABLET | Freq: Once | ORAL | Status: AC
Start: 2023-12-10 — End: 2023-12-10
  Administered 2023-12-10: 650 mg via ORAL
  Filled 2023-12-10: qty 2

## 2023-12-10 MED ORDER — ISOSORB DINITRATE-HYDRALAZINE 20-37.5 MG PO TABS
1.0000 | ORAL_TABLET | Freq: Three times a day (TID) | ORAL | Status: DC
  Administered 2023-12-11 – 2023-12-15 (×10): 1 via ORAL
  Filled 2023-12-10 (×11): qty 1

## 2023-12-10 MED ORDER — CARVEDILOL 6.25 MG PO TABS
6.2500 mg | ORAL_TABLET | Freq: Two times a day (BID) | ORAL | Status: DC
  Administered 2023-12-10 – 2023-12-16 (×9): 6.25 mg via ORAL
  Filled 2023-12-10 (×11): qty 1

## 2023-12-10 NOTE — ED Triage Notes (Signed)
 The patient has had a cough for 1 week and now having shortness of breath. He has dialysis today.

## 2023-12-10 NOTE — ED Notes (Signed)
 [facility] called for transport.

## 2023-12-10 NOTE — ED Notes (Signed)
 ED Provider at bedside.

## 2023-12-10 NOTE — ED Provider Notes (Signed)
 [facility] Provider Note   CSN: [id number] Arrival date & time: 12/10/23  1048     History  Chief Complaint  Patient presents with   Shortness of Breath   Cough   Chest Pain    YAZEED PRYER is a 63 y.o. male.  HPI     63 year old male with a history of chronic systolic CHF, ESRD on hemodialysis Tuesday Thursday Saturday, hypertension, hyperlipidemia who presents today with multiple concerns including abdominal pain, chest pain, shortness of breath and cough.  He has been receiving his regularly scheduled dialysis.  Reports for the last 2 months he has been having abdominal pain with eating, chest pain that changes depending on the position he is in, and decreased appetite.  He reports he is seeing gastroenterology and had an ultrasound regarding this, but his symptoms of abdominal pain and chest pain are increasing.  He reports for the last 2 weeks he has had what he calls is a dry cough that he has when he has extra fluid on, but reports he has been getting his regularly scheduled dialysis and does not note any change in the cough with dialysis.  He also reports shortness of breath for the last 2 days which did not improve after he had his dialysis today.  He does note some abdominal and chest pain with a deep breath.  Denies fever.  n/v last night.  Intermittent n/v or time. Was given zofran  rx at Gastroenterology office which has helped some.    Past Medical History:  Diagnosis Date   Acute on chronic systolic CHF (congestive heart failure) (HCC) 06/14/2020   Acute respiratory failure (HCC) 05/28/2022   AICD (automatic cardioverter/defibrillator) present    2015   Allergy     Asthma    no meds   Cardiac arrest [facility]) 2015   Chest pain 05/25/2022   CHF (congestive heart failure) (HCC)    Coronary artery disease    ESRD on hemodialysis (HCC)    ckd -stage 5   Hyperlipidemia    Hypertension    Myocardial infarction (HCC)     Obesity    Pneumonia    Shortness of breath dyspnea    Sleep apnea    USES CPAP   Wears glasses     Home Medications Prior to Admission medications   Medication Sig Start Date End Date Taking? Authorizing Provider  albuterol  (PROVENTIL ) (2.5 MG/3ML) 0.083% nebulizer solution INHALE THE CONTENTS OF 1 VIAL BY MOUTH EVERY 6 HOURS AS NEEDED 10/04/23   [name], MD  albuterol  (VENTOLIN  HFA) 108 [phone] Base) MCG/ACT inhaler INHALE TWO PUFFS BY MOUTH EVERY 4 HOURS AS NEEDED 04/14/23   [name], MD  ALLERGY  RELIEF CETIRIZINE  10 MG tablet Take 10 mg by mouth 2 (two) times daily. 08/23/23   [provider]  AMBULATORY NON FORMULARY MEDICATION Medication Name: diltiazem 2% / lidocaine  5% ointment - pea sized amount PR TID 09/28/23   Armbruster, Elspeth SQUIBB, MD  amiodarone  (PACERONE ) 200 MG tablet Take 1 tablet (200 mg total) by mouth 2 (two) times daily. 10/03/23   Rolan Ezra RAMAN, MD  apixaban  (ELIQUIS ) 5 MG TABS tablet NEW PRESCRIPTION REQUEST: Eliquis  5 Mg - TAKE ONE TABLET BY MOUTH TWICE DAILY 10/03/23   Rolan Ezra RAMAN, MD  atorvastatin  (LIPITOR ) 80 MG tablet Take 1 tablet (80 mg total) by mouth at bedtime. 11/08/23 11/07/24  Rolan Ezra RAMAN, MD  budesonide -formoterol  (SYMBICORT ) 80-4.5 MCG/ACT inhaler INHALE TWO  PUFFS BY MOUTH TWICE DAILY. 11/23/23   [name], MD  carvedilol  (COREG ) 6.25 MG tablet Take 6.25 mg by mouth 2 (two) times daily. 07/27/23   [provider]  Cholecalciferol  125 MCG (5000 UT) TABS Take 1 tablet (5,000 Units total) by mouth daily. 12/01/22   [name], MD  docusate sodium  (COLACE) 100 MG capsule TAKE ONE CAPSULE BY MOUTH DAILY 07/27/23   [name], MD  doxycycline  (VIBRA -TABS) 100 MG tablet Take 1 tablet (100 mg total) by mouth 2 (two) times daily as directed 11/15/23     EPINEPHrine  0.3 mg/0.3 mL IJ SOAJ injection Inject 0.3 mg into the muscle once as needed for up to 2 doses (if worsening tongue swelling, SOB,  hypoxia, or other concerns for progressive anaphylaxis). 12/01/22   [name], MD  ethyl chloride spray SPRAY A SMALL AMOUNT THREE TIMES A WEEK JUST PRIOR TO NEEDLE INSERTION 06/27/23   [name], MD  fluticasone  (FLONASE ) 50 MCG/ACT nasal spray INSTILL 1 SPRAY IN EACH NOTRIL DAILY AS NEEDED 10/04/23   [name], MD  hydrOXYzine  (ATARAX ) 25 MG tablet Take 3 tablets (75 mg total) by mouth at bedtime. 06/27/23   [name], MD  isosorbide -hydrALAZINE  (BIDIL ) 20-37.5 MG tablet TAKE 1/2 TABLET BY MOUTH THREE TIMES DAILY ON MONDAY, WEDNESDAY, FRIDAY AND SUNDAY 12/08/23   Milford, Harlene HERO, FNP  lanthanum  (FOSRENOL ) 1000 MG chewable tablet Chew 2 tablets (2,000 mg total) by mouth 3 (three) times daily AND 1 tablet (1,000 mg total) 2 (two) times daily as needed with snacks 06/14/23   Dennise Hoes, MD  linaclotide  (LINZESS ) 72 MCG capsule TAKE ONE CAPSULE BY MOUTH ON MONDAY, WEDNESDAY, FRIDAY AND SATURDAY 10/19/23   [name], MD  mexiletine (MEXITIL ) 150 MG capsule Take 2 capsules (300 mg total) by mouth 2 (two) times daily. 10/03/23   Rolan Ezra RAMAN, MD  multivitamin (RENA-VIT) TABS tablet Take 1 tablet by mouth daily. 08/23/23   [provider]  ondansetron  (ZOFRAN ) 4 MG tablet Take 1 tablet (4 mg total) by mouth 3 (three) times daily before meals. 12/01/23   Beather Delon Gibson, PA  pantoprazole  (PROTONIX ) 40 MG tablet Take 1 tablet (40 mg total) by mouth 2 (two) times daily before a meal. 12/01/23   Lemmon, Delon Gibson, PA  Polyethylene Glycol 3350  (PEG 3350 ) 17 g PACK NEW PRESCRIPTION REQUEST: Polyethylene Glycol 3350 - 1 PACKET BY MOUTH DAILY AS NEEDED 12/01/22   [name], MD  tadalafil  (CIALIS ) 10 MG tablet Take 1 tablet (10 mg total) by mouth daily as needed 30 min prior to sexual activity.  Do not take with Imdur  (isosorbide ) or nitroglycerin  02/22/23         Allergies    Ace inhibitors, Influenza vaccines, Ketorolac, Lidocaine ,  Lisinopril, and Penicillins    Review of Systems   Review of Systems  Physical Exam Updated Vital Signs BP (!) 149/78 (BP Location: Left Wrist)   Pulse 70   Temp 98.4 F (36.9 C) (Oral)   Resp 17   Ht 5' 7 (1.702 m)   Wt 100.8 kg   SpO2 95%   BMI 34.81 kg/m  Physical Exam Vitals and nursing note reviewed.  Constitutional:      General: He is not in acute distress.    Appearance: He is well-developed. He is not diaphoretic.  HENT:     Head: Normocephalic and atraumatic.  Eyes:     Conjunctiva/sclera: Conjunctivae normal.  Cardiovascular:  Rate and Rhythm: Normal rate and regular rhythm.     Heart sounds: Normal heart sounds. No murmur heard.    No friction rub. No gallop.  Pulmonary:     Effort: Pulmonary effort is normal. No respiratory distress.     Breath sounds: Normal breath sounds. No wheezing or rales.  Abdominal:     General: There is no distension.     Palpations: Abdomen is soft.     Tenderness: There is abdominal tenderness. There is no guarding.  Musculoskeletal:     Cervical back: Normal range of motion.  Skin:    General: Skin is warm and dry.  Neurological:     Mental Status: He is alert and oriented to person, place, and time.     ED Results / Procedures / Treatments   Labs (all labs ordered are listed, but only abnormal results are displayed) Labs Reviewed  CBC - Abnormal; Notable for the following components:      Result Value   RBC 4.13 (*)    Hemoglobin 12.2 (*)    HCT 36.5 (*)    RDW 17.0 (*)    All other components within normal limits  BRAIN NATRIURETIC PEPTIDE - Abnormal; Notable for the following components:   B Natriuretic Peptide 368.3 (*)    All other components within normal limits  BASIC METABOLIC PANEL - Abnormal; Notable for the following components:   Potassium 3.0 (*)    Chloride 97 (*)    Creatinine, Ser 4.33 (*)    Calcium  7.9 (*)    GFR, Estimated 15 (*)    All other components within normal limits  HEPATIC  FUNCTION PANEL - Abnormal; Notable for the following components:   Total Protein 5.6 (*)    Albumin  2.2 (*)    AST 249 (*)    ALT 183 (*)    All other components within normal limits  TROPONIN I (HIGH SENSITIVITY) - Abnormal; Notable for the following components:   Troponin I (High Sensitivity) 43 (*)    All other components within normal limits  RESP PANEL BY RT-PCR (RSV, FLU A&B, COVID)  RVPGX2  LIPASE, BLOOD  HIV ANTIBODY (ROUTINE TESTING W REFLEX)  COMPREHENSIVE METABOLIC PANEL  CBC  CANCER ANTIGEN 19-9  TROPONIN I (HIGH SENSITIVITY)    EKG EKG Interpretation Date/Time:  Saturday December 10 2023 10:57:54 EST Ventricular Rate:  66 PR Interval:    QRS Duration:  193 QT Interval:  536 QTC Calculation: 562 R Axis:   -67  Text Interpretation: Atrial fibrillation Left bundle branch block No significant change since last tracing Confirmed by Dreama Longs ([id number]) on 12/10/2023 9:17:33 PM  Radiology CT Angio Chest PE W and/or Wo Contrast Result Date: 12/10/2023 CLINICAL DATA:  Cough, shortness of breath for 1 week * Tracking Code: BO * EXAM: CT ANGIOGRAPHY CHEST CT ABDOMEN AND PELVIS WITH CONTRAST TECHNIQUE: Multidetector CT imaging of the chest was performed using the standard protocol during bolus administration of intravenous contrast. Multiplanar CT image reconstructions and MIPs were obtained to evaluate the vascular anatomy. Multidetector CT imaging of the abdomen and pelvis was performed using the standard protocol during bolus administration of intravenous contrast. RADIATION DOSE REDUCTION: This exam was performed according to the departmental dose-optimization program which includes automated exposure control, adjustment of the mA and/or kV according to patient size and/or use of iterative reconstruction technique. CONTRAST:  80mL OMNIPAQUE  IOHEXOL  350 MG/ML SOLN COMPARISON:  None Available. FINDINGS: CT CHEST ANGIOGRAM FINDINGS Cardiovascular: Satisfactory opacification of the  pulmonary arteries to the segmental level. No evidence of pulmonary embolism. Mild cardiomegaly. Three-vessel coronary artery calcifications. No pericardial effusion. Aortic atherosclerosis. Mediastinum/Nodes: No enlarged mediastinal, hilar, or axillary lymph nodes. Thyroid  gland, trachea, and esophagus demonstrate no significant findings. Lungs/Pleura: Minimal centrilobular emphysema. Mild diffuse bilateral bronchial wall thickening. No pleural effusion or pneumothorax. Musculoskeletal: Subcutaneous defibrillator. No acute osseous findings. Review of the MIP images confirms the above findings. CT ABDOMEN PELVIS FINDINGS Hepatobiliary: No solid liver abnormality is seen. 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 Hypodense lesion adjacent the falciform ligament, measuring 2.0 x 1.8 cm (series 306, image 18). No gallstones, gallbladder wall thickening, or biliary dilatation. Pancreas: Large, heterogeneously hypodense mass which appears to arise from the pancreatic head but is also inseparable from the descending duodenum, measuring 5.0 x 3.8 x 4.7 cm (series 306, image 29, series 605, image 129). No pancreatic ductal dilatation or surrounding inflammatory changes. Spleen: Normal in size without significant abnormality. Adrenals/Urinary Tract: Adrenal glands are unremarkable. Atrophic kidneys. No hydronephrosis. Bladder is unremarkable. Stomach/Bowel: Stomach is within normal limits. Appendix appears normal. No evidence of bowel wall thickening, distention, or inflammatory changes. Vascular/Lymphatic: Aortic atherosclerosis. Enlarged gastrohepatic ligament and celiac axis lymph nodes measuring up to 2.0 x 1.4 cm (series 306, image 18). Numerous prominent retroperitoneal lymph nodes, left retroperitoneal nodes measuring up to 1.4 x 1.0 cm (series 306, image 29). Reproductive: No mass or other significant abnormality. Other: No abdominal wall hernia or abnormality. No ascites. Musculoskeletal: No  acute or significant osseous findings. IMPRESSION: 1. Negative examination for pulmonary embolism. 2. Large, heterogeneously hypodense mass which appears to arise from the pancreatic head but is also inseparable from the descending duodenum, measuring 5.0 x 3.8 x 4.7 cm, highly concerning for malignancy. 3. Enlarged gastrohepatic ligament and celiac axis lymph nodes measuring up to 2.0 x 1.4 cm. Numerous prominent retroperitoneal lymph nodes, left retroperitoneal nodes measuring up to 1.4 x 1.0 cm. These are generally nonspecific although highly concerning for nodal metastatic disease. 4. Hypodense hepatic lesion adjacent to the falciform ligament, measuring 2.0 x 1.8 cm. Although this is generally characteristic in location and appearance for focal fatty deposition, a hepatic metastasis is difficult to exclude given other findings. 5. 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 6. Emphysema and diffuse bilateral bronchial wall thickening. 7. Coronary artery disease. Aortic Atherosclerosis (ICD10-I70.0) and Emphysema (ICD10-J43.9). Electronically Signed   By: Marolyn JONETTA Jaksch M.D.   On: 12/10/2023 16:33   CT ABDOMEN PELVIS W CONTRAST Result Date: 12/10/2023 CLINICAL DATA:  Cough, shortness of breath for 1 week * Tracking Code: BO * EXAM: CT ANGIOGRAPHY CHEST CT ABDOMEN AND PELVIS WITH CONTRAST TECHNIQUE: Multidetector CT imaging of the chest was performed using the standard protocol during bolus administration of intravenous contrast. Multiplanar CT image reconstructions and MIPs were obtained to evaluate the vascular anatomy. Multidetector CT imaging of the abdomen and pelvis was performed using the standard protocol during bolus administration of intravenous contrast. RADIATION DOSE REDUCTION: This exam was performed according to the departmental dose-optimization program which includes automated exposure control, adjustment of the mA and/or kV according to patient size and/or use of iterative  reconstruction technique. CONTRAST:  80mL OMNIPAQUE  IOHEXOL  350 MG/ML SOLN COMPARISON:  None Available. FINDINGS: CT CHEST ANGIOGRAM FINDINGS Cardiovascular: Satisfactory opacification of the pulmonary arteries to the segmental level. No evidence of pulmonary embolism. Mild cardiomegaly. Three-vessel coronary artery calcifications. No pericardial effusion. Aortic atherosclerosis. Mediastinum/Nodes: No enlarged mediastinal, hilar, or axillary lymph nodes. Thyroid  gland, trachea, and esophagus  demonstrate no significant findings. Lungs/Pleura: Minimal centrilobular emphysema. Mild diffuse bilateral bronchial wall thickening. No pleural effusion or pneumothorax. Musculoskeletal: Subcutaneous defibrillator. No acute osseous findings. Review of the MIP images confirms the above findings. CT ABDOMEN PELVIS FINDINGS Hepatobiliary: No solid liver abnormality is seen. 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 Hypodense lesion adjacent the falciform ligament, measuring 2.0 x 1.8 cm (series 306, image 18). No gallstones, gallbladder wall thickening, or biliary dilatation. Pancreas: Large, heterogeneously hypodense mass which appears to arise from the pancreatic head but is also inseparable from the descending duodenum, measuring 5.0 x 3.8 x 4.7 cm (series 306, image 29, series 605, image 129). No pancreatic ductal dilatation or surrounding inflammatory changes. Spleen: Normal in size without significant abnormality. Adrenals/Urinary Tract: Adrenal glands are unremarkable. Atrophic kidneys. No hydronephrosis. Bladder is unremarkable. Stomach/Bowel: Stomach is within normal limits. Appendix appears normal. No evidence of bowel wall thickening, distention, or inflammatory changes. Vascular/Lymphatic: Aortic atherosclerosis. Enlarged gastrohepatic ligament and celiac axis lymph nodes measuring up to 2.0 x 1.4 cm (series 306, image 18). Numerous prominent retroperitoneal lymph nodes, left retroperitoneal nodes  measuring up to 1.4 x 1.0 cm (series 306, image 29). Reproductive: No mass or other significant abnormality. Other: No abdominal wall hernia or abnormality. No ascites. Musculoskeletal: No acute or significant osseous findings. IMPRESSION: 1. Negative examination for pulmonary embolism. 2. Large, heterogeneously hypodense mass which appears to arise from the pancreatic head but is also inseparable from the descending duodenum, measuring 5.0 x 3.8 x 4.7 cm, highly concerning for malignancy. 3. Enlarged gastrohepatic ligament and celiac axis lymph nodes measuring up to 2.0 x 1.4 cm. Numerous prominent retroperitoneal lymph nodes, left retroperitoneal nodes measuring up to 1.4 x 1.0 cm. These are generally nonspecific although highly concerning for nodal metastatic disease. 4. Hypodense hepatic lesion adjacent to the falciform ligament, measuring 2.0 x 1.8 cm. Although this is generally characteristic in location and appearance for focal fatty deposition, a hepatic metastasis is difficult to exclude given other findings. 5. 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 6. Emphysema and diffuse bilateral bronchial wall thickening. 7. Coronary artery disease. Aortic Atherosclerosis (ICD10-I70.0) and Emphysema (ICD10-J43.9). Electronically Signed   By: Marolyn JONETTA Jaksch M.D.   On: 12/10/2023 16:33   DG Chest 2 View Result Date: 12/10/2023 CLINICAL DATA:  Shortness of breath.  Cough for 1 week. EXAM: CHEST - 2 VIEW COMPARISON:  11/14/2023 FINDINGS: Unchanged position of left lateral chest wall ICD. Heart size is normal. Scarring noted in the left base. No pleural fluid, interstitial edema or airspace consolidation. IMPRESSION: 1. No acute findings. 2. Left base scarring. Electronically Signed   By: Waddell Calk M.D.   On: 12/10/2023 11:44    Procedures Procedures    Medications Ordered in ED Medications  amiodarone  (PACERONE ) tablet 200 mg (has no administration in time range)  atorvastatin   (LIPITOR ) tablet 80 mg (has no administration in time range)  carvedilol  (COREG ) tablet 6.25 mg (has no administration in time range)  mexiletine (MEXITIL ) capsule 300 mg (has no administration in time range)  fluticasone  furoate-vilanterol (BREO ELLIPTA ) 100-25 MCG/ACT 1 puff (has no administration in time range)  sodium chloride  flush (NS) 0.9 % injection 3 mL (has no administration in time range)  sodium chloride  flush (NS) 0.9 % injection 3 mL (has no administration in time range)  0.9 %  sodium chloride  infusion (has no administration in time range)  acetaminophen  (TYLENOL ) tablet 650 mg (has no administration in time range)    Or  acetaminophen  (TYLENOL ) suppository 650 mg (has no administration in time range)  ondansetron  (ZOFRAN ) tablet 4 mg (has no administration in time range)    Or  ondansetron  (ZOFRAN ) injection 4 mg (has no administration in time range)  docusate sodium  (COLACE) capsule 100 mg (has no administration in time range)  apixaban  (ELIQUIS ) tablet 5 mg (has no administration in time range)  iohexol  (OMNIPAQUE ) 350 MG/ML injection 100 mL (80 mLs Intravenous Contrast Given 12/10/23 1603)  acetaminophen  (TYLENOL ) tablet 650 mg (650 mg Oral Given 12/10/23 1551)    ED Course/ Medical Decision Making/ A&P                                  63 year old male with a history of chronic systolic CHF, ESRD on hemodialysis Tuesday Thursday Saturday, hypertension, hyperlipidemia who presents today with multiple concerns including abdominal pain, chest pain, shortness of breath and cough.  DDx for dyspnea includes CHF, PE, pneumonia, viral syndrome, anemia, metabolic abnormality, pericardial or pleural effusion.   EKG evaluated by me without acute changes.  CXR evaluated by me and radiology without acute abnormalities.  Given dyspnea despite dialysis, pleutitic cpa, no significant findings on XR, will plan on CTA PE study for further evaluation.  DDx for abdominal pain includes  pancreatitis, hepatitis, cholecystitis, diverticulitis, colitis, SBO, other.  Labs obtained and evaluated by me show BNP less than previous, troponin less than previous, no clinically significant anemia, no leukocytosis, no pancreatitis, mild transaminintis, mild hypokalemia.  CT pending at time of transfer of care to Dr. Ruthe.        Final Clinical Impression(s) / ED Diagnoses Final diagnoses:  Pancreatic mass  Dyspnea, unspecified type  Chest pain, unspecified type  Abdominal pain, unspecified abdominal location    Rx / DC Orders ED Discharge Orders     None         Dreama Longs, MD 12/10/23 2209

## 2023-12-10 NOTE — H&P (Signed)
 History and Physical    [name]:[id number] DOB: 1961/11/19 DOA: 12/10/2023  PCP: Victor Barnie NOVAK, MD   Patient coming from: Home   Chief Complaint:  Chief Complaint  Patient presents with   Shortness of Breath   Cough   Chest Pain   ED TRIAGE note:The patient has had a cough for 1 week and now having shortness of breath. He has dialysis today.   HPI:  Victor Thompson is a 63 y.o. male with medical history significant of ESRD on HD TTS schedule, ischemic cardiomyopathy, congestive heart reduced EF 25 to 30%, history of CAD, VT storm status post ICD currently on Eliquis  and amiodarone , reactive airway disease and hyperlipidemia but emergency department complaining of epigastric pain, shortness of breath, cough and chest pain.  Further workup in the ED showed pancreatic mass concerning for malignancy.  ED physician discussed case with [facility] Dr. Rollin who requested transfer to [facility] For endoscopic ultrasound guided biopsy.  Patient already following [facility] and had an ultrasound few weeks ago for gallbladder and liver however today CT scan showed pancreatic mass concerning for cancer.  At presentation to ED patient found hypotensive blood pressure 87/74 which has been improved to 149/78, O2 sat 95% room air, respiratory rate 17 and pulse 70. Normal respiratory panel. CBC showing stable H&H 12.2, 36, WBC count 4.3 and platelet 240. Initial troponin 43.  Patient baseline troponin around 74  to 100. Elevated BNP 368.  Patient's baseline around [phone] range. BMP showed low potassium 3, low chloride 97, creatinine 4.33, calcium  four 7.9 and GFR 15. Hepatic function panel showed elevated AST 249, ALT 193 and low albumin  2.2.  Normal lipase 40.  EKG showed atrial fibrillation rate controlled 66 and left bundle branch block.  Chest x-ray showed left lung base scarring no acute finding.  CTA chest and abdomen finding following: 1. Negative examination for  pulmonary embolism. 2. Large, heterogeneously hypodense mass which appears to arise from the pancreatic head but is also inseparable from the descending duodenum, measuring 5.0 x 3.8 x 4.7 cm, highly concerning for malignancy. 3. Enlarged gastrohepatic ligament and celiac axis lymph nodes measuring up to 2.0 x 1.4 cm. Numerous prominent retroperitoneal lymph nodes, left retroperitoneal nodes measuring up to 1.4 x 1.0 cm. These are generally nonspecific although highly concerning for nodal metastatic disease. 4. Hypodense hepatic lesion adjacent to the falciform ligament, measuring 2.0 x 1.8 cm. Although this is generally characteristic in location and appearance for focal fatty deposition, a hepatic metastasis is difficult to exclude given other findings. 5. 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 6. Emphysema and diffuse bilateral bronchial wall thickening. 7. Coronary artery disease.  In the ED patient received Tylenol  650 mg and transferred to [facility] for further management of new diagnosis of the pancreatic mass.  During my evaluation at the bedside patient reported that he has ongoing chronic chest pain for last 3 to 4 months and he has been referred cardiology to gastroenterology clinic for evaluation for epigastric pain and per patient patient was diagnosed with heartburn and started on oral Protonix .  Patient reported chest pain and epigastric pain get worse with exertion.  He has associated nausea and poor appetite.  Patient denies any palpitation, shortness of breath, headache, vomiting, constipation or diarrhea.  Denies any change of weight.    Significant labs in the ED: Lab Orders         Resp panel by RT-PCR (RSV, Flu  A&B, Covid) Anterior Nasal Swab         CBC         Brain natriuretic peptide         Basic metabolic panel         Hepatic function panel         Lipase, blood         HIV Antibody (routine testing w rflx)          Comprehensive metabolic panel         CBC         Cancer antigen 19-9       Review of Systems:  Review of Systems  Constitutional:  Negative for chills, fever, malaise/fatigue and weight loss.  Respiratory:  Negative for cough, shortness of breath and wheezing.   Cardiovascular:  Positive for chest pain. Negative for palpitations, claudication and leg swelling.  Gastrointestinal:  Positive for abdominal pain and nausea. Negative for constipation, heartburn and vomiting.  Genitourinary:  Negative for dysuria and urgency.  Musculoskeletal:  Negative for back pain and myalgias.  Neurological:  Negative for dizziness and headaches.  Endo/Heme/Allergies:  Does not bruise/bleed easily.  Psychiatric/Behavioral:  The patient is not nervous/anxious.   All other systems reviewed and are negative.    Past Medical History:  Diagnosis Date   Acute on chronic systolic CHF (congestive heart failure) (HCC) 06/14/2020   Acute respiratory failure (HCC) 05/28/2022   AICD (automatic cardioverter/defibrillator) present    2015   Allergy     Asthma    no meds   Cardiac arrest (HCC) 2015   Chest pain 05/25/2022   CHF (congestive heart failure) (HCC)    Coronary artery disease    ESRD on hemodialysis (HCC)    ckd -stage 5   Hyperlipidemia    Hypertension    Myocardial infarction (HCC)    Obesity    Pneumonia    Shortness of breath dyspnea    Sleep apnea    USES CPAP   Wears glasses     Past Surgical History:  Procedure Laterality Date   AV FISTULA PLACEMENT Right 03/15/2019   Procedure: RIGHT ARM ARTERIOVENOUS (AV) FISTULA CREATION;  Surgeon: Eliza Lonni RAMAN, MD;  Location: [facility] OR;  Service: Vascular;  Laterality: Right;   COLONOSCOPY     COLONOSCOPY W/ BIOPSIES AND POLYPECTOMY     COLONOSCOPY WITH PROPOFOL  N/A 09/28/2023   Procedure: COLONOSCOPY WITH PROPOFOL ;  Surgeon: Leigh Elspeth SQUIBB, MD;  Location: [facility] ENDOSCOPY;  Service: Gastroenterology;  Laterality: N/A;   CORONARY STENT  PLACEMENT  2007   IMPLANTABLE CARDIOVERTER DEFIBRILLATOR IMPLANT  2015   POLYPECTOMY  09/28/2023   Procedure: POLYPECTOMY;  Surgeon: Leigh Elspeth SQUIBB, MD;  Location: [facility] ENDOSCOPY;  Service: Gastroenterology;;   RIGHT/LEFT HEART CATH AND CORONARY ANGIOGRAPHY N/A 07/08/2021   Procedure: RIGHT/LEFT HEART CATH AND CORONARY ANGIOGRAPHY;  Surgeon: Rolan Ezra RAMAN, MD;  Location: [facility] INVASIVE CV LAB;  Service: Cardiovascular;  Laterality: N/A;   RIGHT/LEFT HEART CATH AND CORONARY ANGIOGRAPHY N/A 08/25/2022   Procedure: RIGHT/LEFT HEART CATH AND CORONARY ANGIOGRAPHY;  Surgeon: Rolan Ezra RAMAN, MD;  Location: [facility] INVASIVE CV LAB;  Service: Cardiovascular;  Laterality: N/A;   SUBQ ICD CHANGEOUT N/A 10/13/2020   Procedure: SUBQ ICD CHANGEOUT;  Surgeon: Waddell Danelle ORN, MD;  Location: [facility] INVASIVE CV LAB;  Service: Cardiovascular;  Laterality: N/A;     reports that he has never smoked. He has never used smokeless tobacco. He reports that he does not drink alcohol and  does not use drugs.  Allergies  Allergen Reactions   Ace Inhibitors Swelling    Swelling of the tongue   Influenza Vaccines Hives and Swelling    SWELLING REACTION UNSPECIFIED    Ketorolac Swelling    SWELLING REACTION UNSPECIFIED    Lidocaine  Swelling    TONGUE SWELLS   Lisinopril Swelling    TONGUE SWELLING Pt reported problem with a BP med which sounded like  lisinopril  But as of 09/19/06,pt had tolerated altace without problem   Penicillins Swelling    TONGUE SWELLS Has patient had a PCN reaction causing immediate rash, facial/tongue/throat swelling, SOB or lightheadedness with hypotension: Yes Has patient had a PCN reaction causing severe rash involving mucus membranes or skin necrosis: No Has patient had a PCN reaction that required hospitalization No Has patient had a PCN reaction occurring within the last 10 years: Yes If all of the above answers are NO, then may proceed with Cephalosporin use.     Family History   Problem Relation Age of Onset   Hypertension Mother    Liver disease Father    Colon cancer Neg Hx    Esophageal cancer Neg Hx    Rectal cancer Neg Hx    Stomach cancer Neg Hx    Colon polyps Neg Hx     Prior to Admission medications   Medication Sig Start Date End Date Taking? Authorizing Provider  albuterol  (PROVENTIL ) (2.5 MG/3ML) 0.083% nebulizer solution INHALE THE CONTENTS OF 1 VIAL BY MOUTH EVERY 6 HOURS AS NEEDED 10/04/23   Victor Barnie NOVAK, MD  albuterol  (VENTOLIN  HFA) 108 [phone] Base) MCG/ACT inhaler INHALE TWO PUFFS BY MOUTH EVERY 4 HOURS AS NEEDED 04/14/23   Victor Barnie NOVAK, MD  ALLERGY  RELIEF CETIRIZINE  10 MG tablet Take 10 mg by mouth 2 (two) times daily. 08/23/23   [provider]  AMBULATORY NON FORMULARY MEDICATION Medication Name: diltiazem 2% / lidocaine  5% ointment - pea sized amount PR TID 09/28/23   Armbruster, Elspeth SQUIBB, MD  amiodarone  (PACERONE ) 200 MG tablet Take 1 tablet (200 mg total) by mouth 2 (two) times daily. 10/03/23   Rolan Ezra RAMAN, MD  apixaban  (ELIQUIS ) 5 MG TABS tablet NEW PRESCRIPTION REQUEST: Eliquis  5 Mg - TAKE ONE TABLET BY MOUTH TWICE DAILY 10/03/23   Rolan Ezra RAMAN, MD  atorvastatin  (LIPITOR ) 80 MG tablet Take 1 tablet (80 mg total) by mouth at bedtime. 11/08/23 11/07/24  Rolan Ezra RAMAN, MD  budesonide -formoterol  (SYMBICORT ) 80-4.5 MCG/ACT inhaler INHALE TWO PUFFS BY MOUTH TWICE DAILY. 11/23/23   Victor Barnie NOVAK, MD  carvedilol  (COREG ) 6.25 MG tablet Take 6.25 mg by mouth 2 (two) times daily. 07/27/23   [provider]  Cholecalciferol  125 MCG (5000 UT) TABS Take 1 tablet (5,000 Units total) by mouth daily. 12/01/22   Victor Barnie NOVAK, MD  docusate sodium  (COLACE) 100 MG capsule TAKE ONE CAPSULE BY MOUTH DAILY 07/27/23   Victor Barnie NOVAK, MD  doxycycline  (VIBRA -TABS) 100 MG tablet Take 1 tablet (100 mg total) by mouth 2 (two) times daily as directed 11/15/23     EPINEPHrine  0.3 mg/0.3 mL IJ SOAJ injection Inject 0.3 mg  into the muscle once as needed for up to 2 doses (if worsening tongue swelling, SOB, hypoxia, or other concerns for progressive anaphylaxis). 12/01/22   Victor Barnie NOVAK, MD  ethyl chloride spray SPRAY A SMALL AMOUNT THREE TIMES A WEEK JUST PRIOR TO NEEDLE INSERTION 06/27/23   Victor Barnie NOVAK, MD  fluticasone  (FLONASE ) 50 MCG/ACT nasal  spray INSTILL 1 SPRAY IN EACH NOTRIL DAILY AS NEEDED 10/04/23   Victor Barnie NOVAK, MD  hydrOXYzine  (ATARAX ) 25 MG tablet Take 3 tablets (75 mg total) by mouth at bedtime. 06/27/23   Victor Barnie NOVAK, MD  isosorbide -hydrALAZINE  (BIDIL ) 20-37.5 MG tablet TAKE 1/2 TABLET BY MOUTH THREE TIMES DAILY ON MONDAY, WEDNESDAY, FRIDAY AND SUNDAY 12/08/23   Milford, Harlene HERO, FNP  lanthanum  (FOSRENOL ) 1000 MG chewable tablet Chew 2 tablets (2,000 mg total) by mouth 3 (three) times daily AND 1 tablet (1,000 mg total) 2 (two) times daily as needed with snacks 06/14/23   Dennise Hoes, MD  linaclotide  (LINZESS ) 72 MCG capsule TAKE ONE CAPSULE BY MOUTH ON MONDAY, WEDNESDAY, FRIDAY AND SATURDAY 10/19/23   Victor Barnie NOVAK, MD  mexiletine (MEXITIL ) 150 MG capsule Take 2 capsules (300 mg total) by mouth 2 (two) times daily. 10/03/23   Rolan Ezra RAMAN, MD  multivitamin (RENA-VIT) TABS tablet Take 1 tablet by mouth daily. 08/23/23   [provider]  ondansetron  (ZOFRAN ) 4 MG tablet Take 1 tablet (4 mg total) by mouth 3 (three) times daily before meals. 12/01/23   Beather Delon Gibson, PA  pantoprazole  (PROTONIX ) 40 MG tablet Take 1 tablet (40 mg total) by mouth 2 (two) times daily before a meal. 12/01/23   Lemmon, Delon Gibson, PA  Polyethylene Glycol 3350  (PEG 3350 ) 17 g PACK NEW PRESCRIPTION REQUEST: Polyethylene Glycol 3350 - 1 PACKET BY MOUTH DAILY AS NEEDED 12/01/22   Victor Barnie NOVAK, MD  tadalafil  (CIALIS ) 10 MG tablet Take 1 tablet (10 mg total) by mouth daily as needed 30 min prior to sexual activity.  Do not take with Imdur  (isosorbide ) or nitroglycerin  02/22/23         Physical Exam: Vitals:   12/10/23 1930 12/10/23 1936 12/10/23 2049 12/10/23 2133  BP: (!) 108/28  (!) 115/51 (!) 149/78  Pulse: 74  72 70  Resp:   16 17  Temp:   98 F (36.7 C) 98.4 F (36.9 C)  TempSrc:   Oral Oral  SpO2: 96% 97% 95% 95%  Weight:      Height:       Physical Exam Constitutional:      General: He is not in acute distress.    Appearance: He is obese. He is not ill-appearing.  HENT:     Mouth/Throat:     Mouth: Mucous membranes are moist.  Cardiovascular:     Rate and Rhythm: Normal rate and regular rhythm.     Heart sounds: No murmur heard. Pulmonary:     Effort: Pulmonary effort is normal.     Breath sounds: Normal breath sounds. No decreased breath sounds.  Chest:     Chest wall: No tenderness.  Abdominal:     General: Bowel sounds are normal.     Palpations: Abdomen is soft. There is no hepatomegaly or mass.     Tenderness: There is no abdominal tenderness. There is no guarding or rebound.  Musculoskeletal:        General: Normal range of motion.     Cervical back: Neck supple.  Skin:    Capillary Refill: Capillary refill takes less than 2 seconds.  Neurological:     Mental Status: He is alert and oriented to person, place, and time.      Labs on Admission: I have personally reviewed following labs and imaging studies  CBC: Recent Labs  Lab 12/10/23 1125  WBC 4.3  HGB 12.2*  HCT 36.5*  MCV 88.4  PLT 240   Basic Metabolic Panel: Recent Labs  Lab 12/10/23 1339  NA 139  K 3.0*  CL 97*  CO2 32  GLUCOSE 90  BUN 7  CREATININE 4.33*  CALCIUM  7.9*   GFR: Estimated Creatinine Clearance: 20.5 mL/min (A) (by C-G formula based on SCr of 4.33 mg/dL (H)). Liver Function Tests: Recent Labs  Lab 12/10/23 1339  AST 249*  ALT 183*  ALKPHOS 83  BILITOT 0.9  PROT 5.6*  ALBUMIN  2.2*   Recent Labs  Lab 12/10/23 1339  LIPASE 40   No results for input(s): AMMONIA in the last 168 hours. Coagulation Profile: No results for  input(s): INR, PROTIME in the last 168 hours. Cardiac Enzymes: Recent Labs  Lab 12/10/23 1125  TROPONINIHS 43*   BNP (last 3 results) Recent Labs    09/07/23 0855 11/14/23 2129 12/10/23 1125  BNP 44.1 1,094.0* 368.3*   HbA1C: No results for input(s): HGBA1C in the last 72 hours. CBG: No results for input(s): GLUCAP in the last 168 hours. Lipid Profile: No results for input(s): CHOL, HDL, LDLCALC, TRIG, CHOLHDL, LDLDIRECT in the last 72 hours. Thyroid  Function Tests: No results for input(s): TSH, T4TOTAL, FREET4, T3FREE, THYROIDAB in the last 72 hours. Anemia Panel: No results for input(s): VITAMINB12, FOLATE, FERRITIN, TIBC, IRON, RETICCTPCT in the last 72 hours. Urine analysis:    Component Value Date/Time   COLORURINE YELLOW 03/04/2016 1019   APPEARANCEUR CLEAR 03/04/2016 1019   LABSPEC 1.016 03/04/2016 1019   PHURINE 7.5 03/04/2016 1019   GLUCOSEU NEGATIVE 03/04/2016 1019   GLUCOSEU NEG mg/dL 96/85/7988 7773   HGBUR TRACE (A) 03/04/2016 1019   HGBUR negative 10/01/2008 0929   BILIRUBINUR negative 08/25/2021 1504   KETONESUR negative 08/25/2021 1504   KETONESUR NEGATIVE 03/04/2016 1019   PROTEINUR >300 (A) 03/04/2016 1019   UROBILINOGEN 0.2 08/25/2021 1504   UROBILINOGEN 0.2 02/16/2010 2226   NITRITE Negative 08/25/2021 1504   NITRITE NEGATIVE 03/04/2016 1019   LEUKOCYTESUR Trace (A) 08/25/2021 1504    Radiological Exams on Admission: I have personally reviewed images CT Angio Chest PE W and/or Wo Contrast Result Date: 12/10/2023 CLINICAL DATA:  Cough, shortness of breath for 1 week * Tracking Code: BO * EXAM: CT ANGIOGRAPHY CHEST CT ABDOMEN AND PELVIS WITH CONTRAST TECHNIQUE: Multidetector CT imaging of the chest was performed using the standard protocol during bolus administration of intravenous contrast. Multiplanar CT image reconstructions and MIPs were obtained to evaluate the vascular anatomy. Multidetector CT imaging  of the abdomen and pelvis was performed using the standard protocol during bolus administration of intravenous contrast. RADIATION DOSE REDUCTION: This exam was performed according to the departmental dose-optimization program which includes automated exposure control, adjustment of the mA and/or kV according to patient size and/or use of iterative reconstruction technique. CONTRAST:  80mL OMNIPAQUE  IOHEXOL  350 MG/ML SOLN COMPARISON:  None Available. FINDINGS: CT CHEST ANGIOGRAM FINDINGS Cardiovascular: Satisfactory opacification of the pulmonary arteries to the segmental level. No evidence of pulmonary embolism. Mild cardiomegaly. Three-vessel coronary artery calcifications. No pericardial effusion. Aortic atherosclerosis. Mediastinum/Nodes: No enlarged mediastinal, hilar, or axillary lymph nodes. Thyroid  gland, trachea, and esophagus demonstrate no significant findings. Lungs/Pleura: Minimal centrilobular emphysema. Mild diffuse bilateral bronchial wall thickening. No pleural effusion or pneumothorax. Musculoskeletal: Subcutaneous defibrillator. No acute osseous findings. Review of the MIP images confirms the above findings. CT ABDOMEN PELVIS FINDINGS Hepatobiliary: No solid liver abnormality is seen. 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 Hypodense lesion adjacent the falciform ligament, measuring 2.0 x 1.8 cm (  series 306, image 18). No gallstones, gallbladder wall thickening, or biliary dilatation. Pancreas: Large, heterogeneously hypodense mass which appears to arise from the pancreatic head but is also inseparable from the descending duodenum, measuring 5.0 x 3.8 x 4.7 cm (series 306, image 29, series 605, image 129). No pancreatic ductal dilatation or surrounding inflammatory changes. Spleen: Normal in size without significant abnormality. Adrenals/Urinary Tract: Adrenal glands are unremarkable. Atrophic kidneys. No hydronephrosis. Bladder is unremarkable. Stomach/Bowel: Stomach is  within normal limits. Appendix appears normal. No evidence of bowel wall thickening, distention, or inflammatory changes. Vascular/Lymphatic: Aortic atherosclerosis. Enlarged gastrohepatic ligament and celiac axis lymph nodes measuring up to 2.0 x 1.4 cm (series 306, image 18). Numerous prominent retroperitoneal lymph nodes, left retroperitoneal nodes measuring up to 1.4 x 1.0 cm (series 306, image 29). Reproductive: No mass or other significant abnormality. Other: No abdominal wall hernia or abnormality. No ascites. Musculoskeletal: No acute or significant osseous findings. IMPRESSION: 1. Negative examination for pulmonary embolism. 2. Large, heterogeneously hypodense mass which appears to arise from the pancreatic head but is also inseparable from the descending duodenum, measuring 5.0 x 3.8 x 4.7 cm, highly concerning for malignancy. 3. Enlarged gastrohepatic ligament and celiac axis lymph nodes measuring up to 2.0 x 1.4 cm. Numerous prominent retroperitoneal lymph nodes, left retroperitoneal nodes measuring up to 1.4 x 1.0 cm. These are generally nonspecific although highly concerning for nodal metastatic disease. 4. Hypodense hepatic lesion adjacent to the falciform ligament, measuring 2.0 x 1.8 cm. Although this is generally characteristic in location and appearance for focal fatty deposition, a hepatic metastasis is difficult to exclude given other findings. 5. 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 6. Emphysema and diffuse bilateral bronchial wall thickening. 7. Coronary artery disease. Aortic Atherosclerosis (ICD10-I70.0) and Emphysema (ICD10-J43.9). Electronically Signed   By: Marolyn JONETTA Jaksch M.D.   On: 12/10/2023 16:33   CT ABDOMEN PELVIS W CONTRAST Result Date: 12/10/2023 CLINICAL DATA:  Cough, shortness of breath for 1 week * Tracking Code: BO * EXAM: CT ANGIOGRAPHY CHEST CT ABDOMEN AND PELVIS WITH CONTRAST TECHNIQUE: Multidetector CT imaging of the chest was performed using the  standard protocol during bolus administration of intravenous contrast. Multiplanar CT image reconstructions and MIPs were obtained to evaluate the vascular anatomy. Multidetector CT imaging of the abdomen and pelvis was performed using the standard protocol during bolus administration of intravenous contrast. RADIATION DOSE REDUCTION: This exam was performed according to the departmental dose-optimization program which includes automated exposure control, adjustment of the mA and/or kV according to patient size and/or use of iterative reconstruction technique. CONTRAST:  80mL OMNIPAQUE  IOHEXOL  350 MG/ML SOLN COMPARISON:  None Available. FINDINGS: CT CHEST ANGIOGRAM FINDINGS Cardiovascular: Satisfactory opacification of the pulmonary arteries to the segmental level. No evidence of pulmonary embolism. Mild cardiomegaly. Three-vessel coronary artery calcifications. No pericardial effusion. Aortic atherosclerosis. Mediastinum/Nodes: No enlarged mediastinal, hilar, or axillary lymph nodes. Thyroid  gland, trachea, and esophagus demonstrate no significant findings. Lungs/Pleura: Minimal centrilobular emphysema. Mild diffuse bilateral bronchial wall thickening. No pleural effusion or pneumothorax. Musculoskeletal: Subcutaneous defibrillator. No acute osseous findings. Review of the MIP images confirms the above findings. CT ABDOMEN PELVIS FINDINGS Hepatobiliary: No solid liver abnormality is seen. 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 Hypodense lesion adjacent the falciform ligament, measuring 2.0 x 1.8 cm (series 306, image 18). No gallstones, gallbladder wall thickening, or biliary dilatation. Pancreas: Large, heterogeneously hypodense mass which appears to arise from the pancreatic head but is also inseparable from the descending duodenum, measuring 5.0 x  3.8 x 4.7 cm (series 306, image 29, series 605, image 129). No pancreatic ductal dilatation or surrounding inflammatory changes. Spleen:  Normal in size without significant abnormality. Adrenals/Urinary Tract: Adrenal glands are unremarkable. Atrophic kidneys. No hydronephrosis. Bladder is unremarkable. Stomach/Bowel: Stomach is within normal limits. Appendix appears normal. No evidence of bowel wall thickening, distention, or inflammatory changes. Vascular/Lymphatic: Aortic atherosclerosis. Enlarged gastrohepatic ligament and celiac axis lymph nodes measuring up to 2.0 x 1.4 cm (series 306, image 18). Numerous prominent retroperitoneal lymph nodes, left retroperitoneal nodes measuring up to 1.4 x 1.0 cm (series 306, image 29). Reproductive: No mass or other significant abnormality. Other: No abdominal wall hernia or abnormality. No ascites. Musculoskeletal: No acute or significant osseous findings. IMPRESSION: 1. Negative examination for pulmonary embolism. 2. Large, heterogeneously hypodense mass which appears to arise from the pancreatic head but is also inseparable from the descending duodenum, measuring 5.0 x 3.8 x 4.7 cm, highly concerning for malignancy. 3. Enlarged gastrohepatic ligament and celiac axis lymph nodes measuring up to 2.0 x 1.4 cm. Numerous prominent retroperitoneal lymph nodes, left retroperitoneal nodes measuring up to 1.4 x 1.0 cm. These are generally nonspecific although highly concerning for nodal metastatic disease. 4. Hypodense hepatic lesion adjacent to the falciform ligament, measuring 2.0 x 1.8 cm. Although this is generally characteristic in location and appearance for focal fatty deposition, a hepatic metastasis is difficult to exclude given other findings. 5. 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 6. Emphysema and diffuse bilateral bronchial wall thickening. 7. Coronary artery disease. Aortic Atherosclerosis (ICD10-I70.0) and Emphysema (ICD10-J43.9). Electronically Signed   By: Marolyn JONETTA Jaksch M.D.   On: 12/10/2023 16:33   DG Chest 2 View Result Date: 12/10/2023 CLINICAL DATA:  Shortness of  breath.  Cough for 1 week. EXAM: CHEST - 2 VIEW COMPARISON:  11/14/2023 FINDINGS: Unchanged position of left lateral chest wall ICD. Heart size is normal. Scarring noted in the left base. No pleural fluid, interstitial edema or airspace consolidation. IMPRESSION: 1. No acute findings. 2. Left base scarring. Electronically Signed   By: Waddell Calk M.D.   On: 12/10/2023 11:44     EKG: My personal interpretation of EKG shows: Sinus rhythm heart rate 66 and left axis deviation.    Assessment/Plan: Principal Problem:   Pancreatic mass Active Problems:   Combined congestive systolic and diastolic heart failure (HCC)   Hypotension   Hypokalemia   Elevated troponin   Chest pain   Reactive airway disease   ESRD (end stage renal disease) (HCC)   History of ventricular tachycardia s/p ICD    Assessment and Plan: Pancreatic mass Transaminitis secondary to pancreatic mass > Patient presenting with emergency department multiple complaining of epigastric pain, nausea, chest pain, weight loss, poor appetite, shortness of breath and cough. - In the ED patient found hypotensive otherwise hemodynamically stable -CBC showed stable H&H.  BMP showed low potassium 3 otherwise unremarkable. - Hepatic function showed elevated AST to 49, elevated ALT 183.  Normal alkaline phosphatase and bilirubin level.  Normal lipase. -CT abdomen showed pancreatic mass with multiple lymph node metastasis concerning for pancreatic cancer. - ED physician has been consulted GI Dr. Rollin recommended transfer to [facility] and plan for endoscopic ultrasound biopsy.  Informed Dr. Rollin regarding arrival of this patient to the [facility]. - Plan to keep patient n.p.o. after midnight. - Continue Zofran  as needed for nausea and Protonix  twice daily. - Will follow-up with gastroenterology for further recommendation and plan.   Chronic chest  pain Elevated troponin History of nonobstructive CAD > Patient is  complaining about chest pain both at rest and on exertion which has been ongoing for last 3 to 4 months.  Denies any associated palpitation, shortness of breath, nausea and diaphoresis. - Per chart review patient has history of CAD status post PCI 2005 and Cardiolite  2017 showed large fixed defect suggestive of prior infarction and EF 20%, left heart cath 07/25/2021 no significant CAD. - Initial troponin 43.  Per chart review patient baseline troponin around 5o to 100.  Chronic elevated troponin in the setting of ESRD patient. -Plan to continue to trend troponin and obtaining echocardiogram to assess wall motion abnormality. -If echocardiogram shows wall motion abnormality in that case need to consult cardiology inpatient. -Continue Coreg , Eliquis , Lipitor .  (Eliquis  on hold for tonight and tomorrow morning for possible endoscopic biopsy tomorrow). -Continue cardiac monitoring.  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 - Continue Coreg  and BiDil .   History of ventricular tachycardia status post ICD - Holding Eliquis  for tonight and tomorrow morning for endoscopic biopsy later sometime tomorrow. -Continue amiodarone , mexiletine and Coreg .  Reactive airway disease Cough and shortness of breath - Respiratory panel ruled out COVID, flu flu and RSV.  Patient is not volume overloaded had dialysis today.  Chest x-ray unremarkable.  CTA chest unremarkable. - Continue with Breo Ellipta  twice daily and Xopenex  every 6 hour as needed for any wheezing shortness of breath  ESRD on dialysis TTS schedule -Last dialysis today 12/09/2022.  Slight low potassium 3.  Not supplementing with potassium as ESRD patient.  Euvolemic on physical exam. -Please reach out to nephrology daytime for next dialysis on Tuesday.  VT prophylaxis:  SCDs.  On  Eliquis  Code Status:  Full Code Diet: Renal diet.  N.p.o. after midnight Family Communication: None present Disposition Plan: Pending formal GI evaluation and endoscopic biopsy tomorrow Consults: Gastroenterology Admission status:   Inpatient, Telemetry bed  Severity of Illness: The appropriate patient status for this patient is INPATIENT. Inpatient status is judged to be reasonable and necessary in order to provide the required intensity of service to ensure the patient's safety. The patient's presenting symptoms, physical exam findings, and initial radiographic and laboratory data in the context of their chronic comorbidities is felt to place them at high risk for further clinical deterioration. Furthermore, it is not anticipated that the patient will be medically stable for discharge from the hospital within 2 midnights of admission.   * I certify that at the point of admission it is my clinical judgment that the patient will require inpatient hospital care spanning beyond 2 midnights from the point of admission due to high intensity of service, high risk for further deterioration and high frequency of surveillance required.DEWAINE    Anayelli Lai, MD [facility]  How to contact the [facility] Attending or Consulting provider 7A - 7P or covering provider during after hours 7P -7A, for this patient.  Check the care team in [facility] and look for a) attending/consulting [facility] provider listed and b) the [facility] team listed Log into [email or web address] and use [place]'s universal password to access. If you do not have the password, please contact the hospital operator. Locate the [facility] provider you are looking for under [facility] and page to a number that you can be directly reached. If you still have difficulty reaching the provider, please page  the [facility] (Director on Call) for the Hospitalists listed on amion for assistance.  12/10/2023, 10:56 PM

## 2023-12-10 NOTE — ED Provider Notes (Signed)
 Patient with CT scan that shows pancreatic/small bowel mass.  Talked with Dr. Rollin with gastroenterology and does think that patient should be admitted for endoscopic ultrasound biopsy.  Lab work otherwise unremarkable.  CT scan of the chest otherwise unremarkable.  Patient has been dealing with pain for couple months.  He has been getting outpatient workup with GI as he had an ultrasound a few weeks ago of his gallbladder and liver but today CT scan reveals pancreatic mass concerning for cancer.  Will admit to medicine for further care.  This chart was dictated using voice recognition software.  Despite best efforts to proofread,  errors can occur which can change the documentation meaning.    Victor Cornet, DO 12/10/23 [id number]

## 2023-12-10 NOTE — Progress Notes (Signed)
 Plan of Care Note for accepted transfer   Patient: Victor Thompson MRN: [id number]   DOA: 12/10/2023  Facility requesting transfer: [facility]. Requesting Provider: Dr. Avonne. Reason for transfer: Epigastric pain. Facility course: 63 year old male with a known history of ESRD, cardiomyopathy presents to the ER with complaints of epigastric/chest pain.  Workup shows pancreatic mass concerning for malignancy.  ER physician discussed with Dr. Rollin gastroenterologist who requested transfer to [facility] and likely will need EUS and biopsy.  Plan of care: The patient is accepted for admission to Telemetry unit, at [facility]..   Author: Redia LOISE Cleaver, MD 12/10/2023  Check [email or web address] for on-call coverage.  Nursing staff, Please call [facility] Admits & Consults System-Wide number on Amion as soon as patient's arrival, so appropriate admitting provider can evaluate the pt.

## 2023-12-10 NOTE — ED Notes (Signed)
Report called to Caroline, RN

## 2023-12-10 NOTE — ED Notes (Signed)
 Dr Ihor Gully requests [facility] as patient goes there.  Called [place] on call for [employer]  [phone]

## 2023-12-10 NOTE — ED Notes (Signed)
 ED TO INPATIENT HANDOFF REPORT  ED Nurse Name and Phone #: Sharlet Lemmings RN [phone]  S Name/Age/Gender Victor Thompson  63 y.o. male Room/Bed: MH05/MH05  Code Status   Code Status: Prior  Home/SNF/Other Home Patient oriented to: self, place, time, and situation Is this baseline? Yes   Triage Complete: Triage complete  Chief Complaint Pancreatic mass [K86.89]  Triage Note The patient has had a cough for 1 week and now having shortness of breath. He has dialysis today.    Allergies Allergies  Allergen Reactions   Ace Inhibitors Swelling    Swelling of the tongue   Influenza Vaccines Hives and Swelling    SWELLING REACTION UNSPECIFIED    Ketorolac Swelling    SWELLING REACTION UNSPECIFIED    Lidocaine  Swelling    TONGUE SWELLS   Lisinopril Swelling    TONGUE SWELLING Pt reported problem with a BP med which sounded like  lisinopril  But as of 09/19/06,pt had tolerated altace without problem   Penicillins Swelling    TONGUE SWELLS Has patient had a PCN reaction causing immediate rash, facial/tongue/throat swelling, SOB or lightheadedness with hypotension: Yes Has patient had a PCN reaction causing severe rash involving mucus membranes or skin necrosis: No Has patient had a PCN reaction that required hospitalization No Has patient had a PCN reaction occurring within the last 10 years: Yes If all of the above answers are NO, then may proceed with Cephalosporin use.     Level of Care/Admitting Diagnosis ED Disposition     ED Disposition  Admit   Condition  --   Comment  Hospital Area: [facility] [[id number]]  Level of Care: Telemetry Medical [[id number]]  May admit patient to [facility] or [facility] if equivalent level of care is available:: No  Interfacility transfer: Yes  Covid Evaluation: Asymptomatic - no recent exposure (last 10 days) testing not required  Diagnosis: Pancreatic mass [679608]  Admitting Physician: FRANKY REDIA SAILOR  [[id number]]  Attending Physician: FRANKY REDIA SAILOR [phone]  Certification:: I certify this patient will need inpatient services for at least 2 midnights  Expected Medical Readiness: 12/12/2023          B Medical/Surgery History Past Medical History:  Diagnosis Date   Acute on chronic systolic CHF (congestive heart failure) (HCC) 06/14/2020   Acute respiratory failure (HCC) 05/28/2022   AICD (automatic cardioverter/defibrillator) present    2015   Allergy     Asthma    no meds   Cardiac arrest (HCC) 2015   Chest pain 05/25/2022   CHF (congestive heart failure) (HCC)    Coronary artery disease    ESRD on hemodialysis (HCC)    ckd -stage 5   Hyperlipidemia    Hypertension    Myocardial infarction (HCC)    Obesity    Pneumonia    Shortness of breath dyspnea    Sleep apnea    USES CPAP   Wears glasses    Past Surgical History:  Procedure Laterality Date   AV FISTULA PLACEMENT Right 03/15/2019   Procedure: RIGHT ARM ARTERIOVENOUS (AV) FISTULA CREATION;  Surgeon: Eliza Lonni RAMAN, MD;  Location: [facility] OR;  Service: Vascular;  Laterality: Right;   COLONOSCOPY     COLONOSCOPY W/ BIOPSIES AND POLYPECTOMY     COLONOSCOPY WITH PROPOFOL  N/A 09/28/2023   Procedure: COLONOSCOPY WITH PROPOFOL ;  Surgeon: Leigh Elspeth SQUIBB, MD;  Location: [facility] ENDOSCOPY;  Service: Gastroenterology;  Laterality: N/A;   CORONARY STENT PLACEMENT  2007   IMPLANTABLE CARDIOVERTER  DEFIBRILLATOR IMPLANT  2015   POLYPECTOMY  09/28/2023   Procedure: POLYPECTOMY;  Surgeon: Leigh Elspeth SQUIBB, MD;  Location: [facility] ENDOSCOPY;  Service: Gastroenterology;;   RIGHT/LEFT HEART CATH AND CORONARY ANGIOGRAPHY N/A 07/08/2021   Procedure: RIGHT/LEFT HEART CATH AND CORONARY ANGIOGRAPHY;  Surgeon: Rolan Ezra RAMAN, MD;  Location: [facility] INVASIVE CV LAB;  Service: Cardiovascular;  Laterality: N/A;   RIGHT/LEFT HEART CATH AND CORONARY ANGIOGRAPHY N/A 08/25/2022   Procedure: RIGHT/LEFT HEART CATH AND CORONARY ANGIOGRAPHY;  Surgeon:  Rolan Ezra RAMAN, MD;  Location: [facility] INVASIVE CV LAB;  Service: Cardiovascular;  Laterality: N/A;   SUBQ ICD CHANGEOUT N/A 10/13/2020   Procedure: SUBQ ICD CHANGEOUT;  Surgeon: Waddell Danelle ORN, MD;  Location: [facility] INVASIVE CV LAB;  Service: Cardiovascular;  Laterality: N/A;     A IV Location/Drains/Wounds Patient Lines/Drains/Airways Status     Active Line/Drains/Airways     Name Placement date Placement time Site Days   Peripheral IV 12/10/23 20 G Left Antecubital 12/10/23  1225  Antecubital  less than 1   Fistula / Graft Right Upper arm Arteriovenous fistula 03/15/19  1109  Upper arm  1731   Fistula / Graft Right Upper arm Arteriovenous fistula 05/25/22  0000  Upper arm  564            Intake/Output Last 24 hours No intake or output data in the 24 hours ending 12/10/23 2029  Labs/Imaging Results for orders placed or performed during the hospital encounter of 12/10/23 (from the past 48 hours)  Resp panel by RT-PCR (RSV, Flu A&B, Covid) Anterior Nasal Swab     Status: None   Collection Time: 12/10/23 10:58 AM   Specimen: Anterior Nasal Swab  Result Value Ref Range   SARS Coronavirus 2 by RT PCR NEGATIVE NEGATIVE    Comment: (NOTE) SARS-CoV-2 target nucleic acids are NOT DETECTED.  The SARS-CoV-2 RNA is generally detectable in upper respiratory specimens during the acute phase of infection. The lowest concentration of SARS-CoV-2 viral copies this assay can detect is 138 copies/mL. A negative result does not preclude SARS-Cov-2 infection and should not be used as the sole basis for treatment or other patient management decisions. A negative result may occur with  improper specimen collection/handling, submission of specimen other than nasopharyngeal swab, presence of viral mutation(s) within the areas targeted by this assay, and inadequate number of viral copies(<138 copies/mL). A negative result must be combined with clinical observations, patient history, and  epidemiological information. The expected result is Negative.  Fact Sheet for Patients:  bloggercourse.com  Fact Sheet for Healthcare Providers:  seriousbroker.it  This test is no t yet approved or cleared by the [place]  FDA and  has been authorized for detection and/or diagnosis of SARS-CoV-2 by FDA under an Emergency Use Authorization (EUA). This EUA will remain  in effect (meaning this test can be used) for the duration of the COVID-19 declaration under Section 564(b)(1) of the Act, 21 U.S.C.section 360bbb-3(b)(1), unless the authorization is terminated  or revoked sooner.       Influenza A by PCR NEGATIVE NEGATIVE   Influenza B by PCR NEGATIVE NEGATIVE    Comment: (NOTE) The Xpert Xpress SARS-CoV-2/FLU/RSV plus assay is intended as an aid in the diagnosis of influenza from Nasopharyngeal swab specimens and should not be used as a sole basis for treatment. Nasal washings and aspirates are unacceptable for Xpert Xpress SARS-CoV-2/FLU/RSV testing.  Fact Sheet for Patients: bloggercourse.com  Fact Sheet for Healthcare Providers: seriousbroker.it  This test is not yet  approved or cleared by the [place]  FDA and has been authorized for detection and/or diagnosis of SARS-CoV-2 by FDA under an Emergency Use Authorization (EUA). This EUA will remain in effect (meaning this test can be used) for the duration of the COVID-19 declaration under Section 564(b)(1) of the Act, 21 U.S.C. section 360bbb-3(b)(1), unless the authorization is terminated or revoked.     Resp Syncytial Virus by PCR NEGATIVE NEGATIVE    Comment: (NOTE) Fact Sheet for Patients: bloggercourse.com  Fact Sheet for Healthcare Providers: seriousbroker.it  This test is not yet approved or cleared by the [place]  FDA and has been authorized for  detection and/or diagnosis of SARS-CoV-2 by FDA under an Emergency Use Authorization (EUA). This EUA will remain in effect (meaning this test can be used) for the duration of the COVID-19 declaration under Section 564(b)(1) of the Act, 21 U.S.C. section 360bbb-3(b)(1), unless the authorization is terminated or revoked.  Performed at [facility], [address] [place], [place] [address]   CBC     Status: Abnormal   Collection Time: 12/10/23 11:25 AM  Result Value Ref Range   WBC 4.3 4.0 - 10.5 K/uL   RBC 4.13 (L) 4.22 - 5.81 MIL/uL   Hemoglobin 12.2 (L) 13.0 - 17.0 g/dL   HCT 63.4 (L) 60.9 - 47.9 %   MCV 88.4 80.0 - 100.0 fL   MCH 29.5 26.0 - 34.0 pg   MCHC 33.4 30.0 - 36.0 g/dL   RDW 82.9 (H) 88.4 - 84.4 %   Platelets 240 150 - 400 K/uL   nRBC 0.0 0.0 - 0.2 %    Comment: Performed at [facility], [address] [place], [place] [address]  Troponin I (High Sensitivity)     Status: Abnormal   Collection Time: 12/10/23 11:25 AM  Result Value Ref Range   Troponin I (High Sensitivity) 43 (H) <18 ng/L    Comment: (NOTE) Elevated high sensitivity troponin I (hsTnI) values and significant  changes across serial measurements may suggest ACS but many other  chronic and acute conditions are known to elevate hsTnI results.  Refer to the Links section for chest pain algorithms and additional  guidance. Performed at [facility], [address] [place], [place] [address]   Brain natriuretic peptide     Status: Abnormal   Collection Time: 12/10/23 11:25 AM  Result Value Ref Range   B Natriuretic Peptide 368.3 (H) 0.0 - 100.0 pg/mL    Comment: Performed at [facility], [address] [place], [place] [address]  Basic metabolic panel     Status: Abnormal   Collection Time: 12/10/23  1:39 PM  Result Value Ref Range   Sodium 139 135 - 145 mmol/L   Potassium 3.0 (L) 3.5 - 5.1 mmol/L   Chloride 97 (L) 98 - 111 mmol/L   CO2 32 22 - 32  mmol/L   Glucose, Bld 90 70 - 99 mg/dL    Comment: Glucose reference range applies only to samples taken after fasting for at least 8 hours.   BUN 7 6 - 20 mg/dL   Creatinine, Ser 5.66 (H) 0.61 - 1.24 mg/dL   Calcium  7.9 (L) 8.9 - 10.3 mg/dL   GFR, Estimated 15 (L) >60 mL/min    Comment: (NOTE) Calculated using the CKD-EPI Creatinine Equation (2021)    Anion gap 10 5 - 15    Comment: Performed at [facility], [address] [place], [place] [address]  Hepatic function panel     Status: Abnormal   Collection Time: 12/10/23  1:39 PM  Result Value Ref Range   Total Protein 5.6 (L) 6.5 - 8.1 g/dL   Albumin  2.2 (L) 3.5 - 5.0 g/dL   AST 750 (H) 15 - 41 U/L   ALT 183 (H) 0 - 44 U/L   Alkaline Phosphatase 83 38 - 126 U/L   Total Bilirubin 0.9 0.0 - 1.2 mg/dL   Bilirubin, Direct 0.2 0.0 - 0.2 mg/dL   Indirect Bilirubin 0.7 0.3 - 0.9 mg/dL    Comment: Performed at [facility], [address] [place], [place] [address]  Lipase, blood     Status: None   Collection Time: 12/10/23  1:39 PM  Result Value Ref Range   Lipase 40 11 - 51 U/L    Comment: Performed at [facility], [address] [place], [place] [address]   CT Angio Chest PE W and/or Wo Contrast Result Date: 12/10/2023 CLINICAL DATA:  Cough, shortness of breath for 1 week * Tracking Code: BO * EXAM: CT ANGIOGRAPHY CHEST CT ABDOMEN AND PELVIS WITH CONTRAST TECHNIQUE: Multidetector CT imaging of the chest was performed using the standard protocol during bolus administration of intravenous contrast. Multiplanar CT image reconstructions and MIPs were obtained to evaluate the vascular anatomy. Multidetector CT imaging of the abdomen and pelvis was performed using the standard protocol during bolus administration of intravenous contrast. RADIATION DOSE REDUCTION: This exam was performed according to the departmental dose-optimization program which includes automated exposure control, adjustment of the mA  and/or kV according to patient size and/or use of iterative reconstruction technique. CONTRAST:  80mL OMNIPAQUE  IOHEXOL  350 MG/ML SOLN COMPARISON:  None Available. FINDINGS: CT CHEST ANGIOGRAM FINDINGS Cardiovascular: Satisfactory opacification of the pulmonary arteries to the segmental level. No evidence of pulmonary embolism. Mild cardiomegaly. Three-vessel coronary artery calcifications. No pericardial effusion. Aortic atherosclerosis. Mediastinum/Nodes: No enlarged mediastinal, hilar, or axillary lymph nodes. Thyroid  gland, trachea, and esophagus demonstrate no significant findings. Lungs/Pleura: Minimal centrilobular emphysema. Mild diffuse bilateral bronchial wall thickening. No pleural effusion or pneumothorax. Musculoskeletal: Subcutaneous defibrillator. No acute osseous findings. Review of the MIP images confirms the above findings. CT ABDOMEN PELVIS FINDINGS Hepatobiliary: No solid liver abnormality is seen. 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 Hypodense lesion adjacent the falciform ligament, measuring 2.0 x 1.8 cm (series 306, image 18). No gallstones, gallbladder wall thickening, or biliary dilatation. Pancreas: Large, heterogeneously hypodense mass which appears to arise from the pancreatic head but is also inseparable from the descending duodenum, measuring 5.0 x 3.8 x 4.7 cm (series 306, image 29, series 605, image 129). No pancreatic ductal dilatation or surrounding inflammatory changes. Spleen: Normal in size without significant abnormality. Adrenals/Urinary Tract: Adrenal glands are unremarkable. Atrophic kidneys. No hydronephrosis. Bladder is unremarkable. Stomach/Bowel: Stomach is within normal limits. Appendix appears normal. No evidence of bowel wall thickening, distention, or inflammatory changes. Vascular/Lymphatic: Aortic atherosclerosis. Enlarged gastrohepatic ligament and celiac axis lymph nodes measuring up to 2.0 x 1.4 cm (series 306, image 18). Numerous  prominent retroperitoneal lymph nodes, left retroperitoneal nodes measuring up to 1.4 x 1.0 cm (series 306, image 29). Reproductive: No mass or other significant abnormality. Other: No abdominal wall hernia or abnormality. No ascites. Musculoskeletal: No acute or significant osseous findings. IMPRESSION: 1. Negative examination for pulmonary embolism. 2. Large, heterogeneously hypodense mass which appears to arise from the pancreatic head but is also inseparable  from the descending duodenum, measuring 5.0 x 3.8 x 4.7 cm, highly concerning for malignancy. 3. Enlarged gastrohepatic ligament and celiac axis lymph nodes measuring up to 2.0 x 1.4 cm. Numerous prominent retroperitoneal lymph nodes, left retroperitoneal nodes measuring up to 1.4 x 1.0 cm. These are generally nonspecific although highly concerning for nodal metastatic disease. 4. Hypodense hepatic lesion adjacent to the falciform ligament, measuring 2.0 x 1.8 cm. Although this is generally characteristic in location and appearance for focal fatty deposition, a hepatic metastasis is difficult to exclude given other findings. 5. 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 6. Emphysema and diffuse bilateral bronchial wall thickening. 7. Coronary artery disease. Aortic Atherosclerosis (ICD10-I70.0) and Emphysema (ICD10-J43.9). Electronically Signed   By: Marolyn JONETTA Jaksch M.D.   On: 12/10/2023 16:33   CT ABDOMEN PELVIS W CONTRAST Result Date: 12/10/2023 CLINICAL DATA:  Cough, shortness of breath for 1 week * Tracking Code: BO * EXAM: CT ANGIOGRAPHY CHEST CT ABDOMEN AND PELVIS WITH CONTRAST TECHNIQUE: Multidetector CT imaging of the chest was performed using the standard protocol during bolus administration of intravenous contrast. Multiplanar CT image reconstructions and MIPs were obtained to evaluate the vascular anatomy. Multidetector CT imaging of the abdomen and pelvis was performed using the standard protocol during bolus administration  of intravenous contrast. RADIATION DOSE REDUCTION: This exam was performed according to the departmental dose-optimization program which includes automated exposure control, adjustment of the mA and/or kV according to patient size and/or use of iterative reconstruction technique. CONTRAST:  80mL OMNIPAQUE  IOHEXOL  350 MG/ML SOLN COMPARISON:  None Available. FINDINGS: CT CHEST ANGIOGRAM FINDINGS Cardiovascular: Satisfactory opacification of the pulmonary arteries to the segmental level. No evidence of pulmonary embolism. Mild cardiomegaly. Three-vessel coronary artery calcifications. No pericardial effusion. Aortic atherosclerosis. Mediastinum/Nodes: No enlarged mediastinal, hilar, or axillary lymph nodes. Thyroid  gland, trachea, and esophagus demonstrate no significant findings. Lungs/Pleura: Minimal centrilobular emphysema. Mild diffuse bilateral bronchial wall thickening. No pleural effusion or pneumothorax. Musculoskeletal: Subcutaneous defibrillator. No acute osseous findings. Review of the MIP images confirms the above findings. CT ABDOMEN PELVIS FINDINGS Hepatobiliary: No solid liver abnormality is seen. 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 Hypodense lesion adjacent the falciform ligament, measuring 2.0 x 1.8 cm (series 306, image 18). No gallstones, gallbladder wall thickening, or biliary dilatation. Pancreas: Large, heterogeneously hypodense mass which appears to arise from the pancreatic head but is also inseparable from the descending duodenum, measuring 5.0 x 3.8 x 4.7 cm (series 306, image 29, series 605, image 129). No pancreatic ductal dilatation or surrounding inflammatory changes. Spleen: Normal in size without significant abnormality. Adrenals/Urinary Tract: Adrenal glands are unremarkable. Atrophic kidneys. No hydronephrosis. Bladder is unremarkable. Stomach/Bowel: Stomach is within normal limits. Appendix appears normal. No evidence of bowel wall thickening, distention,  or inflammatory changes. Vascular/Lymphatic: Aortic atherosclerosis. Enlarged gastrohepatic ligament and celiac axis lymph nodes measuring up to 2.0 x 1.4 cm (series 306, image 18). Numerous prominent retroperitoneal lymph nodes, left retroperitoneal nodes measuring up to 1.4 x 1.0 cm (series 306, image 29). Reproductive: No mass or other significant abnormality. Other: No abdominal wall hernia or abnormality. No ascites. Musculoskeletal: No acute or significant osseous findings. IMPRESSION: 1. Negative examination for pulmonary embolism. 2. Large, heterogeneously hypodense mass which appears to arise from the pancreatic head but is also inseparable from the descending duodenum, measuring 5.0 x 3.8 x 4.7 cm, highly concerning for malignancy. 3. Enlarged gastrohepatic ligament and celiac axis lymph nodes measuring up to 2.0 x 1.4 cm. Numerous prominent retroperitoneal lymph nodes,  left retroperitoneal nodes measuring up to 1.4 x 1.0 cm. These are generally nonspecific although highly concerning for nodal metastatic disease. 4. Hypodense hepatic lesion adjacent to the falciform ligament, measuring 2.0 x 1.8 cm. Although this is generally characteristic in location and appearance for focal fatty deposition, a hepatic metastasis is difficult to exclude given other findings. 5. 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 6. Emphysema and diffuse bilateral bronchial wall thickening. 7. Coronary artery disease. Aortic Atherosclerosis (ICD10-I70.0) and Emphysema (ICD10-J43.9). Electronically Signed   By: Marolyn JONETTA Jaksch M.D.   On: 12/10/2023 16:33   DG Chest 2 View Result Date: 12/10/2023 CLINICAL DATA:  Shortness of breath.  Cough for 1 week. EXAM: CHEST - 2 VIEW COMPARISON:  11/14/2023 FINDINGS: Unchanged position of left lateral chest wall ICD. Heart size is normal. Scarring noted in the left base. No pleural fluid, interstitial edema or airspace consolidation. IMPRESSION: 1. No acute findings. 2.  Left base scarring. Electronically Signed   By: Waddell Calk M.D.   On: 12/10/2023 11:44    Pending Labs Unresulted Labs (From admission, onward)    None       Vitals/Pain Today's Vitals   12/10/23 1800 12/10/23 1909 12/10/23 1930 12/10/23 1936  BP: 106/84  (!) 108/28   Pulse: 66  74   Resp: 17     Temp: 98.5 F (36.9 C)     TempSrc:      SpO2: 96%  96% 97%  Weight:      Height:      PainSc:  0-No pain      Isolation Precautions No active isolations  Medications Medications  iohexol  (OMNIPAQUE ) 350 MG/ML injection 100 mL (80 mLs Intravenous Contrast Given 12/10/23 1603)  acetaminophen  (TYLENOL ) tablet 650 mg (650 mg Oral Given 12/10/23 1551)    Mobility walks     Focused Assessments- Has had some abdominal pain that goes and comes over last several months. With N/V and last episode was last night. Obese abdomen. Soft- BS in all quad ar audible. Pulmonary Assessment Handoff:  Lung sounds: Bilateral Breath Sounds: Clear L Breath Sounds: Clear R Breath Sounds: Clear       R Recommendations: See Admitting Provider Note  Report given to:   Additional Notes:

## 2023-12-11 ENCOUNTER — Inpatient Hospital Stay (HOSPITAL_COMMUNITY): Payer: 59

## 2023-12-11 DIAGNOSIS — R079 Chest pain, unspecified: Secondary | ICD-10-CM

## 2023-12-11 DIAGNOSIS — R7989 Other specified abnormal findings of blood chemistry: Secondary | ICD-10-CM | POA: Diagnosis not present

## 2023-12-11 DIAGNOSIS — K8689 Other specified diseases of pancreas: Secondary | ICD-10-CM

## 2023-12-11 LAB — COMPREHENSIVE METABOLIC PANEL
ALT: 182 U/L — ABNORMAL HIGH (ref 0–44)
AST: 237 U/L — ABNORMAL HIGH (ref 15–41)
Albumin: 2 g/dL — ABNORMAL LOW (ref 3.5–5.0)
Alkaline Phosphatase: 97 U/L (ref 38–126)
Anion gap: 10 (ref 5–15)
BUN: 7 mg/dL (ref 6–20)
CO2: 29 mmol/L (ref 22–32)
Calcium: 8.6 mg/dL — ABNORMAL LOW (ref 8.9–10.3)
Chloride: 100 mmol/L (ref 98–111)
Creatinine, Ser: 5.67 mg/dL — ABNORMAL HIGH (ref 0.61–1.24)
GFR, Estimated: 11 mL/min — ABNORMAL LOW (ref >60)
Glucose, Bld: 121 mg/dL — ABNORMAL HIGH (ref 70–99)
Potassium: 3.3 mmol/L — ABNORMAL LOW (ref 3.5–5.1)
Sodium: 139 mmol/L (ref 135–145)
Total Bilirubin: 0.9 mg/dL (ref 0.0–1.2)
Total Protein: 5.5 g/dL — ABNORMAL LOW (ref 6.5–8.1)

## 2023-12-11 LAB — CBC
HCT: 34 % — ABNORMAL LOW (ref 39.0–52.0)
Hemoglobin: 11.5 g/dL — ABNORMAL LOW (ref 13.0–17.0)
MCH: 29.9 pg (ref 26.0–34.0)
MCHC: 33.8 g/dL (ref 30.0–36.0)
MCV: 88.5 fL (ref 80.0–100.0)
Platelets: 180 10*3/uL (ref 150–400)
RBC: 3.84 MIL/uL — ABNORMAL LOW (ref 4.22–5.81)
RDW: 16.9 % — ABNORMAL HIGH (ref 11.5–15.5)
WBC: 4.9 10*3/uL (ref 4.0–10.5)
nRBC: 0 % (ref 0.0–0.2)

## 2023-12-11 LAB — ECHOCARDIOGRAM COMPLETE
AR max vel: 2.45 cm2
AV Area VTI: 2.38 cm2
AV Area mean vel: 2.33 cm2
AV Mean grad: 4.8 mm[Hg]
AV Peak grad: 9.1 mm[Hg]
Ao pk vel: 1.5 m/s
Area-P 1/2: 4.31 cm2
Calc EF: 41.2 %
Height: 67 in
MV M vel: 4.68 m/s
MV Peak grad: 87.6 mm[Hg]
S' Lateral: 5.4 cm
Single Plane A2C EF: 40.2 %
Single Plane A4C EF: 38.7 %
Weight: 3555.58 [oz_av]

## 2023-12-11 LAB — TROPONIN I (HIGH SENSITIVITY)
Troponin I (High Sensitivity): 50 ng/L — ABNORMAL HIGH (ref <18)
Troponin I (High Sensitivity): 47 ng/L — ABNORMAL HIGH (ref <18)
Troponin I (High Sensitivity): 47 ng/L — ABNORMAL HIGH (ref <18)

## 2023-12-11 LAB — HIV ANTIBODY (ROUTINE TESTING W REFLEX): HIV Screen 4th Generation wRfx: NONREACTIVE

## 2023-12-11 MED ORDER — POLYETHYLENE GLYCOL 3350 17 G PO PACK
17.0000 g | PACK | Freq: Every day | ORAL | Status: DC
  Administered 2023-12-11 – 2023-12-15 (×2): 17 g via ORAL
  Filled 2023-12-11 (×4): qty 1

## 2023-12-11 NOTE — Consult Note (Signed)
 CROSS COVER [facility] Reason for Consult: Pancreatic mass. Referring Physician: [facility] hospitalist.  Victor Thompson is an 63 y.o. male.  HPI: HPI: Victor Thompson is a 63 year old black male with multiple medical problems listed below who presents the hospital with 1 week history of cough and shortness of breath with epigastric pain and chest pain a CT scan in the ED revealed a large heterogeneously hypodense mass which appears to arise in the pancreatic head but is inseparable from the descending duodenum measuring 5 x 3.8 x 4.7 cm concerning for malignancy.  Also noted enlarged gastrohepatic ligament and celiac axis lymph nodes measuring 2 x 1.4 cm with numerous prominent retroperitoneal lymph nodes measuring 1.4 x 1 cm concerning for nodal metastatic disease. 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  Patient claims he has had a 30 pound weight loss over the last 3 months and his symptoms started after he had a colonoscopy last September with Dr. Leigh.  He has been complaining of abdominal pain and was given Protonix  to try for presumed reflux.  He had a colonoscopy done on 09/28/2023 by Dr. Leigh when a small posterior anal fissure was noted along with a diminutive polyp in the ascending colon that was removed with a cold biopsy forceps; and internal hemorrhoids were noted on retroflexion.   Past Medical History:  Diagnosis Date   Acute on chronic systolic CHF (congestive heart failure) (HCC) 06/14/2020   Acute respiratory failure (HCC) 05/28/2022   AICD (automatic cardioverter/defibrillator) present    2015   Allergy     Asthma    no meds   Cardiac arrest (HCC) 2015   Chest pain 05/25/2022   CHF (congestive heart failure) (HCC)    Coronary  artery disease    ESRD on hemodialysis (HCC)    ckd -stage 5   Hyperlipidemia    Hypertension    Myocardial infarction (HCC)    Obesity    Pneumonia    Shortness of breath dyspnea    Sleep apnea    USES CPAP   Wears glasses    Past Surgical History:  Procedure Laterality Date   AV FISTULA PLACEMENT Right 03/15/2019   Procedure: RIGHT ARM ARTERIOVENOUS (AV) FISTULA CREATION;  Surgeon: Eliza Lonni RAMAN, MD;  Location: [facility] OR;  Service: Vascular;  Laterality: Right;   COLONOSCOPY     COLONOSCOPY W/ BIOPSIES AND POLYPECTOMY     COLONOSCOPY WITH PROPOFOL  N/A 09/28/2023   Procedure: COLONOSCOPY WITH PROPOFOL ;  Surgeon: Leigh Elspeth SQUIBB, MD;  Location: [facility] ENDOSCOPY;  Service: Gastroenterology;  Laterality: N/A;   CORONARY STENT PLACEMENT  2007   IMPLANTABLE CARDIOVERTER DEFIBRILLATOR IMPLANT  2015   POLYPECTOMY  09/28/2023   Procedure: POLYPECTOMY;  Surgeon: Leigh Elspeth SQUIBB, MD;  Location: [facility] ENDOSCOPY;  Service: Gastroenterology;;   RIGHT/LEFT HEART CATH AND CORONARY ANGIOGRAPHY N/A 07/08/2021   Procedure: RIGHT/LEFT HEART CATH AND CORONARY ANGIOGRAPHY;  Surgeon: Rolan Ezra RAMAN, MD;  Location: [facility] INVASIVE CV LAB;  Service: Cardiovascular;  Laterality: N/A;   RIGHT/LEFT HEART CATH AND CORONARY ANGIOGRAPHY N/A 08/25/2022   Procedure: RIGHT/LEFT HEART CATH AND CORONARY ANGIOGRAPHY;  Surgeon: Rolan Ezra RAMAN, MD;  Location: [facility] INVASIVE CV LAB;  Service: Cardiovascular;  Laterality: N/A;   SUBQ ICD CHANGEOUT N/A 10/13/2020   Procedure: SUBQ ICD CHANGEOUT;  Surgeon:  Waddell Danelle ORN, MD;  Location: [facility] INVASIVE CV LAB;  Service: Cardiovascular;  Laterality: N/A;    Family History  Problem Relation Age of Onset   Hypertension Mother    Liver disease Father    Colon cancer Neg Hx    Esophageal cancer Neg Hx    Rectal cancer Neg Hx    Stomach cancer Neg Hx    Colon polyps Neg Hx    Social History:  reports that he has never smoked. He has never used smokeless tobacco. He reports  that he does not drink alcohol and does not use drugs.  Allergies:  Allergies  Allergen Reactions   Ace Inhibitors Swelling    Swelling of the tongue   Influenza Vaccines Hives and Swelling    SWELLING REACTION UNSPECIFIED    Ketorolac Swelling    SWELLING REACTION UNSPECIFIED    Lidocaine  Swelling    TONGUE SWELLS   Lisinopril Swelling    TONGUE SWELLING Pt reported problem with a BP med which sounded like  lisinopril  But as of 09/19/06,pt had tolerated altace without problem   Penicillins Swelling    TONGUE SWELLS Has patient had a PCN reaction causing immediate rash, facial/tongue/throat swelling, SOB or lightheadedness with hypotension: Yes Has patient had a PCN reaction causing severe rash involving mucus membranes or skin necrosis: No Has patient had a PCN reaction that required hospitalization No Has patient had a PCN reaction occurring within the last 10 years: Yes If all of the above answers are NO, then may proceed with Cephalosporin use.    Medications: I have reviewed the patient's current medications. Prior to Admission:  Medications Prior to Admission  Medication Sig Dispense Refill Last Dose/Taking   albuterol  (PROVENTIL ) (2.5 MG/3ML) 0.083% nebulizer solution INHALE THE CONTENTS OF 1 VIAL BY MOUTH EVERY 6 HOURS AS NEEDED 675 mL 1 Past Week   albuterol  (VENTOLIN  HFA) 108 (90 Base) MCG/ACT inhaler INHALE TWO PUFFS BY MOUTH EVERY 4 HOURS AS NEEDED 60.3 g 2 Past Week   ALLERGY  RELIEF CETIRIZINE  10 MG tablet Take 10 mg by mouth 2 (two) times daily.   12/10/2023   AMBULATORY NON FORMULARY MEDICATION Medication Name: diltiazem 2% / lidocaine  5% ointment - pea sized amount PR TID 30 g 1 Past Week   amiodarone  (PACERONE ) 200 MG tablet Take 1 tablet (200 mg total) by mouth 2 (two) times daily. 60 tablet 2 12/10/2023   apixaban  (ELIQUIS ) 5 MG TABS tablet NEW PRESCRIPTION REQUEST: Eliquis  5 Mg - TAKE ONE TABLET BY MOUTH TWICE DAILY 180 tablet 3 12/10/2023   atorvastatin  (LIPITOR )  80 MG tablet Take 1 tablet (80 mg total) by mouth at bedtime. 90 tablet 3 Past Week   budesonide -formoterol  (SYMBICORT ) 80-4.5 MCG/ACT inhaler INHALE TWO PUFFS BY MOUTH TWICE DAILY. 10.2 g 0 12/10/2023   carvedilol  (COREG ) 6.25 MG tablet Take 6.25 mg by mouth 2 (two) times daily.   12/10/2023   Cholecalciferol  125 MCG (5000 UT) TABS Take 1 tablet (5,000 Units total) by mouth daily. 90 tablet 1 12/10/2023   docusate sodium  (COLACE) 100 MG capsule TAKE ONE CAPSULE BY MOUTH DAILY 90 capsule 1 12/10/2023   EPINEPHrine  0.3 mg/0.3 mL IJ SOAJ injection Inject 0.3 mg into the muscle once as needed for up to 2 doses (if worsening tongue swelling, SOB, hypoxia, or other concerns for progressive anaphylaxis). 2 each 2 Taking As Needed   ethyl chloride spray SPRAY A SMALL AMOUNT THREE TIMES A WEEK JUST PRIOR TO NEEDLE INSERTION 116 mL 5  Past Week   fluticasone  (FLONASE ) 50 MCG/ACT nasal spray INSTILL 1 SPRAY IN EACH NOTRIL DAILY AS NEEDED 15 g 2 Past Week   hydrOXYzine  (ATARAX ) 25 MG tablet Take 3 tablets (75 mg total) by mouth at bedtime. 90 tablet 1 12/10/2023   isosorbide -hydrALAZINE  (BIDIL ) 20-37.5 MG tablet TAKE 1/2 TABLET BY MOUTH THREE TIMES DAILY ON MONDAY, WEDNESDAY, FRIDAY AND SUNDAY 24 tablet 10 12/10/2023   lanthanum  (FOSRENOL ) 1000 MG chewable tablet Chew 2 tablets (2,000 mg total) by mouth 3 (three) times daily AND 1 tablet (1,000 mg total) 2 (two) times daily as needed with snacks 720 tablet 3 12/10/2023   linaclotide  (LINZESS ) 72 MCG capsule TAKE ONE CAPSULE BY MOUTH ON MONDAY, WEDNESDAY, FRIDAY AND SATURDAY 30 capsule 1 12/10/2023   mexiletine (MEXITIL ) 150 MG capsule Take 2 capsules (300 mg total) by mouth 2 (two) times daily. 360 capsule 3 12/10/2023   multivitamin (RENA-VIT) TABS tablet Take 1 tablet by mouth daily.   12/10/2023   ondansetron  (ZOFRAN ) 4 MG tablet Take 1 tablet (4 mg total) by mouth 3 (three) times daily before meals. 90 tablet 1 Past Week   pantoprazole  (PROTONIX ) 40 MG tablet Take 1 tablet (40  mg total) by mouth 2 (two) times daily before a meal. 180 tablet 3 12/10/2023   Polyethylene Glycol 3350  (PEG 3350 ) 17 g PACK NEW PRESCRIPTION REQUEST: Polyethylene Glycol 3350 - 1 PACKET BY MOUTH DAILY AS NEEDED 90 packet 0 Past Week   tadalafil  (CIALIS ) 10 MG tablet Take 1 tablet (10 mg total) by mouth daily as needed 30 min prior to sexual activity.  Do not take with Imdur  (isosorbide ) or nitroglycerin  30 tablet 1 Taking As Needed   doxycycline  (VIBRA -TABS) 100 MG tablet Take 1 tablet (100 mg total) by mouth 2 (two) times daily as directed (Patient not taking: Reported on 12/11/2023) 14 tablet 0 Not Taking   Scheduled:  amiodarone   200 mg Oral BID   apixaban   5 mg Oral BID   atorvastatin   80 mg Oral QHS   carvedilol   6.25 mg Oral BID   docusate sodium   100 mg Oral BID   fluticasone  furoate-vilanterol  1 puff Inhalation Daily   isosorbide -hydrALAZINE   1 tablet Oral TID   mexiletine  300 mg Oral BID   pantoprazole   40 mg Oral BID   sodium chloride  flush  3 mL Intravenous Q12H   Continuous:  sodium chloride      PRN:sodium chloride , acetaminophen  **OR** acetaminophen , levalbuterol , ondansetron  **OR** ondansetron  (ZOFRAN ) IV, sodium chloride  flush  Results for orders placed or performed during the hospital encounter of 12/10/23 (from the past 48 hours)  Resp panel by RT-PCR (RSV, Flu A&B, Covid) Anterior Nasal Swab     Status: None   Collection Time: 12/10/23 10:58 AM   Specimen: Anterior Nasal Swab  Result Value Ref Range   SARS Coronavirus 2 by RT PCR NEGATIVE NEGATIVE    Comment: (NOTE) SARS-CoV-2 target nucleic acids are NOT DETECTED.  The SARS-CoV-2 RNA is generally detectable in upper respiratory specimens during the acute phase of infection. The lowest concentration of SARS-CoV-2 viral copies this assay can detect is 138 copies/mL. A negative result does not preclude SARS-Cov-2 infection and should not be used as the sole basis for treatment or other patient management decisions.  A negative result may occur with  improper specimen collection/handling, submission of specimen other than nasopharyngeal swab, presence of viral mutation(s) within the areas targeted by this assay, and inadequate number of viral copies(<138 copies/mL). A negative result  must be combined with clinical observations, patient history, and epidemiological information. The expected result is Negative.  Fact Sheet for Patients:  bloggercourse.com  Fact Sheet for Healthcare Providers:  seriousbroker.it  This test is no t yet approved or cleared by the [place]  FDA and  has been authorized for detection and/or diagnosis of SARS-CoV-2 by FDA under an Emergency Use Authorization (EUA). This EUA will remain  in effect (meaning this test can be used) for the duration of the COVID-19 declaration under Section 564(b)(1) of the Act, 21 U.S.C.section 360bbb-3(b)(1), unless the authorization is terminated  or revoked sooner.       Influenza A by PCR NEGATIVE NEGATIVE   Influenza B by PCR NEGATIVE NEGATIVE    Comment: (NOTE) The Xpert Xpress SARS-CoV-2/FLU/RSV plus assay is intended as an aid in the diagnosis of influenza from Nasopharyngeal swab specimens and should not be used as a sole basis for treatment. Nasal washings and aspirates are unacceptable for Xpert Xpress SARS-CoV-2/FLU/RSV testing.  Fact Sheet for Patients: bloggercourse.com  Fact Sheet for Healthcare Providers: seriousbroker.it  This test is not yet approved or cleared by the [place]  FDA and has been authorized for detection and/or diagnosis of SARS-CoV-2 by FDA under an Emergency Use Authorization (EUA). This EUA will remain in effect (meaning this test can be used) for the duration of the COVID-19 declaration under Section 564(b)(1) of the Act, 21 U.S.C. section 360bbb-3(b)(1), unless the authorization is  terminated or revoked.     Resp Syncytial Virus by PCR NEGATIVE NEGATIVE    Comment: (NOTE) Fact Sheet for Patients: bloggercourse.com  Fact Sheet for Healthcare Providers: seriousbroker.it  This test is not yet approved or cleared by the [place]  FDA and has been authorized for detection and/or diagnosis of SARS-CoV-2 by FDA under an Emergency Use Authorization (EUA). This EUA will remain in effect (meaning this test can be used) for the duration of the COVID-19 declaration under Section 564(b)(1) of the Act, 21 U.S.C. section 360bbb-3(b)(1), unless the authorization is terminated or revoked.  Performed at [facility], [address] [place], [place] [address]   CBC     Status: Abnormal   Collection Time: 12/10/23 11:25 AM  Result Value Ref Range   WBC 4.3 4.0 - 10.5 K/uL   RBC 4.13 (L) 4.22 - 5.81 MIL/uL   Hemoglobin 12.2 (L) 13.0 - 17.0 g/dL   HCT 63.4 (L) 60.9 - 47.9 %   MCV 88.4 80.0 - 100.0 fL   MCH 29.5 26.0 - 34.0 pg   MCHC 33.4 30.0 - 36.0 g/dL   RDW 82.9 (H) 88.4 - 84.4 %   Platelets 240 150 - 400 K/uL   nRBC 0.0 0.0 - 0.2 %    Comment: Performed at [facility], [address] [place], [place] [address]  Troponin I (High Sensitivity)     Status: Abnormal   Collection Time: 12/10/23 11:25 AM  Result Value Ref Range   Troponin I (High Sensitivity) 43 (H) <18 ng/L    Comment: (NOTE) Elevated high sensitivity troponin I (hsTnI) values and significant  changes across serial measurements may suggest ACS but many other  chronic and acute conditions are known to elevate hsTnI results.  Refer to the Links section for chest pain algorithms and additional  guidance. Performed at [facility], [address], [place], [place] [address]   Brain natriuretic peptide     Status: Abnormal   Collection Time: 12/10/23 11:25  AM  Result Value Ref Range   B Natriuretic Peptide 368.3  (H) 0.0 - 100.0 pg/mL    Comment: Performed at [facility], [address] [place], [place] [address]  Basic metabolic panel     Status: Abnormal   Collection Time: 12/10/23  1:39 PM  Result Value Ref Range   Sodium 139 135 - 145 mmol/L   Potassium 3.0 (L) 3.5 - 5.1 mmol/L   Chloride 97 (L) 98 - 111 mmol/L   CO2 32 22 - 32 mmol/L   Glucose, Bld 90 70 - 99 mg/dL    Comment: Glucose reference range applies only to samples taken after fasting for at least 8 hours.   BUN 7 6 - 20 mg/dL   Creatinine, Ser 5.66 (H) 0.61 - 1.24 mg/dL   Calcium  7.9 (L) 8.9 - 10.3 mg/dL   GFR, Estimated 15 (L) >60 mL/min    Comment: (NOTE) Calculated using the CKD-EPI Creatinine Equation (2021)    Anion gap 10 5 - 15    Comment: Performed at [facility], [address] [place], [place] [address]  Hepatic function panel     Status: Abnormal   Collection Time: 12/10/23  1:39 PM  Result Value Ref Range   Total Protein 5.6 (L) 6.5 - 8.1 g/dL   Albumin  2.2 (L) 3.5 - 5.0 g/dL   AST 750 (H) 15 - 41 U/L   ALT 183 (H) 0 - 44 U/L   Alkaline Phosphatase 83 38 - 126 U/L   Total Bilirubin 0.9 0.0 - 1.2 mg/dL   Bilirubin, Direct 0.2 0.0 - 0.2 mg/dL   Indirect Bilirubin 0.7 0.3 - 0.9 mg/dL    Comment: Performed at [facility], [address] [place], [place] [address]  Lipase, blood     Status: None   Collection Time: 12/10/23  1:39 PM  Result Value Ref Range   Lipase 40 11 - 51 U/L    Comment: Performed at [facility], [address] [place], [place] [address]  HIV Antibody (routine testing w rflx)     Status: None   Collection Time: 12/10/23 11:29 PM  Result Value Ref Range   HIV Screen 4th Generation wRfx Non Reactive Non Reactive    Comment: Performed at [facility], [address], [place], [place] [address]  CBC     Status: Abnormal   Collection Time: 12/11/23  2:16 AM  Result Value Ref Range   WBC 4.9 4.0 - 10.5 K/uL   RBC 3.84 (L) 4.22 - 5.81  MIL/uL   Hemoglobin 11.5 (L) 13.0 - 17.0 g/dL   HCT 65.9 (L) 60.9 - 47.9 %   MCV 88.5 80.0 - 100.0 fL   MCH 29.9 26.0 - 34.0 pg   MCHC 33.8 30.0 - 36.0 g/dL   RDW 83.0 (H) 88.4 - 84.4 %   Platelets 180 150 - 400 K/uL   nRBC 0.0 0.0 - 0.2 %    Comment: Performed at [facility], [address], [place], [place] [address]  Comprehensive metabolic panel     Status: Abnormal   Collection Time: 12/11/23  2:16 AM  Result Value Ref Range   Sodium 139 135 - 145 mmol/L   Potassium 3.3 (L) 3.5 - 5.1 mmol/L   Chloride 100 98 - 111 mmol/L   CO2 29 22 - 32 mmol/L   Glucose, Bld 121 (H) 70 - 99 mg/dL  Comment: Glucose reference range applies only to samples taken after fasting for at least 8 hours.   BUN 7 6 - 20 mg/dL   Creatinine, Ser 4.32 (H) 0.61 - 1.24 mg/dL   Calcium  8.6 (L) 8.9 - 10.3 mg/dL   Total Protein 5.5 (L) 6.5 - 8.1 g/dL   Albumin  2.0 (L) 3.5 - 5.0 g/dL   AST 762 (H) 15 - 41 U/L   ALT 182 (H) 0 - 44 U/L   Alkaline Phosphatase 97 38 - 126 U/L   Total Bilirubin 0.9 0.0 - 1.2 mg/dL   GFR, Estimated 11 (L) >60 mL/min    Comment: (NOTE) Calculated using the CKD-EPI Creatinine Equation (2021)    Anion gap 10 5 - 15    Comment: Performed at [facility], [address], [place], [place] [address]  Troponin I (High Sensitivity)     Status: Abnormal   Collection Time: 12/11/23  2:16 AM  Result Value Ref Range   Troponin I (High Sensitivity) 47 (H) <18 ng/L    Comment: (NOTE) Elevated high sensitivity troponin I (hsTnI) values and significant  changes across serial measurements may suggest ACS but many other  chronic and acute conditions are known to elevate hsTnI results.  Refer to the Links section for chest pain algorithms and additional  guidance. Performed at [facility], [address] [place], [place] [address]   Troponin I (High Sensitivity)     Status: Abnormal   Collection Time: 12/11/23  4:00 AM  Result Value Ref Range   Troponin I (High  Sensitivity) 47 (H) <18 ng/L    Comment: (NOTE) Elevated high sensitivity troponin I (hsTnI) values and significant  changes across serial measurements may suggest ACS but many other  chronic and acute conditions are known to elevate hsTnI results.  Refer to the Links section for chest pain algorithms and additional  guidance. Performed at [facility], [address] [place], [place] [address]     CT Angio Chest PE W and/or Wo Contrast Result Date: 12/10/2023 CLINICAL DATA:  Cough, shortness of breath for 1 week * Tracking Code: BO * EXAM: CT ANGIOGRAPHY CHEST CT ABDOMEN AND PELVIS WITH CONTRAST TECHNIQUE: Multidetector CT imaging of the chest was performed using the standard protocol during bolus administration of intravenous contrast. Multiplanar CT image reconstructions and MIPs were obtained to evaluate the vascular anatomy. Multidetector CT imaging of the abdomen and pelvis was performed using the standard protocol during bolus administration of intravenous contrast. RADIATION DOSE REDUCTION: This exam was performed according to the departmental dose-optimization program which includes automated exposure control, adjustment of the mA and/or kV according to patient size and/or use of iterative reconstruction technique. CONTRAST:  80mL OMNIPAQUE  IOHEXOL  350 MG/ML SOLN COMPARISON:  None Available. FINDINGS: CT CHEST ANGIOGRAM FINDINGS Cardiovascular: Satisfactory opacification of the pulmonary arteries to the segmental level. No evidence of pulmonary embolism. Mild cardiomegaly. Three-vessel coronary artery calcifications. No pericardial effusion. Aortic atherosclerosis. Mediastinum/Nodes: No enlarged mediastinal, hilar, or axillary lymph nodes. Thyroid  gland, trachea, and esophagus demonstrate no significant findings. Lungs/Pleura: Minimal centrilobular emphysema. Mild diffuse bilateral bronchial wall thickening. No pleural effusion or pneumothorax. Musculoskeletal: Subcutaneous  defibrillator. No acute osseous findings. Review of the MIP images confirms the above findings. CT ABDOMEN PELVIS FINDINGS Hepatobiliary: No solid liver abnormality is seen. 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 Hypodense lesion adjacent the falciform ligament, measuring 2.0 x 1.8 cm (series 306, image 18). No gallstones, gallbladder wall thickening, or  biliary dilatation. Pancreas: Large, heterogeneously hypodense mass which appears to arise from the pancreatic head but is also inseparable from the descending duodenum, measuring 5.0 x 3.8 x 4.7 cm (series 306, image 29, series 605, image 129). No pancreatic ductal dilatation or surrounding inflammatory changes. Spleen: Normal in size without significant abnormality. Adrenals/Urinary Tract: Adrenal glands are unremarkable. Atrophic kidneys. No hydronephrosis. Bladder is unremarkable. Stomach/Bowel: Stomach is within normal limits. Appendix appears normal. No evidence of bowel wall thickening, distention, or inflammatory changes. Vascular/Lymphatic: Aortic atherosclerosis. Enlarged gastrohepatic ligament and celiac axis lymph nodes measuring up to 2.0 x 1.4 cm (series 306, image 18). Numerous prominent retroperitoneal lymph nodes, left retroperitoneal nodes measuring up to 1.4 x 1.0 cm (series 306, image 29). Reproductive: No mass or other significant abnormality. Other: No abdominal wall hernia or abnormality. No ascites. Musculoskeletal: No acute or significant osseous findings. IMPRESSION: 1. Negative examination for pulmonary embolism. 2. Large, heterogeneously hypodense mass which appears to arise from the pancreatic head but is also inseparable from the descending duodenum, measuring 5.0 x 3.8 x 4.7 cm, highly concerning for malignancy. 3. Enlarged gastrohepatic ligament and celiac axis lymph nodes measuring up to 2.0 x 1.4 cm. Numerous prominent retroperitoneal lymph nodes, left retroperitoneal nodes measuring up to 1.4 x 1.0 cm.  These are generally nonspecific although highly concerning for nodal metastatic disease. 4. Hypodense hepatic lesion adjacent to the falciform ligament, measuring 2.0 x 1.8 cm. Although this is generally characteristic in location and appearance for focal fatty deposition, a hepatic metastasis is difficult to exclude given other findings. 5. 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 6. Emphysema and diffuse bilateral bronchial wall thickening. 7. Coronary artery disease. Aortic Atherosclerosis (ICD10-I70.0) and Emphysema (ICD10-J43.9). Electronically Signed   By: Marolyn JONETTA Jaksch M.D.   On: 12/10/2023 16:33   CT ABDOMEN PELVIS W CONTRAST Result Date: 12/10/2023 CLINICAL DATA:  Cough, shortness of breath for 1 week * Tracking Code: BO * EXAM: CT ANGIOGRAPHY CHEST CT ABDOMEN AND PELVIS WITH CONTRAST TECHNIQUE: Multidetector CT imaging of the chest was performed using the standard protocol during bolus administration of intravenous contrast. Multiplanar CT image reconstructions and MIPs were obtained to evaluate the vascular anatomy. Multidetector CT imaging of the abdomen and pelvis was performed using the standard protocol during bolus administration of intravenous contrast. RADIATION DOSE REDUCTION: This exam was performed according to the departmental dose-optimization program which includes automated exposure control, adjustment of the mA and/or kV according to patient size and/or use of iterative reconstruction technique. CONTRAST:  80mL OMNIPAQUE  IOHEXOL  350 MG/ML SOLN COMPARISON:  None Available. FINDINGS: CT CHEST ANGIOGRAM FINDINGS Cardiovascular: Satisfactory opacification of the pulmonary arteries to the segmental level. No evidence of pulmonary embolism. Mild cardiomegaly. Three-vessel coronary artery calcifications. No pericardial effusion. Aortic atherosclerosis. Mediastinum/Nodes: No enlarged mediastinal, hilar, or axillary lymph nodes. Thyroid  gland, trachea, and esophagus  demonstrate no significant findings. Lungs/Pleura: Minimal centrilobular emphysema. Mild diffuse bilateral bronchial wall thickening. No pleural effusion or pneumothorax. Musculoskeletal: Subcutaneous defibrillator. No acute osseous findings. Review of the MIP images confirms the above findings. CT ABDOMEN PELVIS FINDINGS Hepatobiliary: No solid liver abnormality is seen. 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 Hypodense lesion adjacent the falciform ligament, measuring 2.0 x 1.8 cm (series 306, image 18). No gallstones, gallbladder wall thickening, or biliary dilatation. Pancreas: Large, heterogeneously hypodense mass which appears to arise from the pancreatic head but is also inseparable from the descending duodenum, measuring 5.0 x 3.8 x 4.7 cm (series 306, image 29, series 605,  image 129). No pancreatic ductal dilatation or surrounding inflammatory changes. Spleen: Normal in size without significant abnormality. Adrenals/Urinary Tract: Adrenal glands are unremarkable. Atrophic kidneys. No hydronephrosis. Bladder is unremarkable. Stomach/Bowel: Stomach is within normal limits. Appendix appears normal. No evidence of bowel wall thickening, distention, or inflammatory changes. Vascular/Lymphatic: Aortic atherosclerosis. Enlarged gastrohepatic ligament and celiac axis lymph nodes measuring up to 2.0 x 1.4 cm (series 306, image 18). Numerous prominent retroperitoneal lymph nodes, left retroperitoneal nodes measuring up to 1.4 x 1.0 cm (series 306, image 29). Reproductive: No mass or other significant abnormality. Other: No abdominal wall hernia or abnormality. No ascites. Musculoskeletal: No acute or significant osseous findings. IMPRESSION: 1. Negative examination for pulmonary embolism. 2. Large, heterogeneously hypodense mass which appears to arise from the pancreatic head but is also inseparable from the descending duodenum, measuring 5.0 x 3.8 x 4.7 cm, highly concerning for malignancy.  3. Enlarged gastrohepatic ligament and celiac axis lymph nodes measuring up to 2.0 x 1.4 cm. Numerous prominent retroperitoneal lymph nodes, left retroperitoneal nodes measuring up to 1.4 x 1.0 cm. These are generally nonspecific although highly concerning for nodal metastatic disease. 4. Hypodense hepatic lesion adjacent to the falciform ligament, measuring 2.0 x 1.8 cm. Although this is generally characteristic in location and appearance for focal fatty deposition, a hepatic metastasis is difficult to exclude given other findings. 5. 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 6. Emphysema and diffuse bilateral bronchial wall thickening. 7. Coronary artery disease. Aortic Atherosclerosis (ICD10-I70.0) and Emphysema (ICD10-J43.9). Electronically Signed   By: Marolyn JONETTA Jaksch M.D.   On: 12/10/2023 16:33   DG Chest 2 View Result Date: 12/10/2023 CLINICAL DATA:  Shortness of breath.  Cough for 1 week. EXAM: CHEST - 2 VIEW COMPARISON:  11/14/2023 FINDINGS: Unchanged position of left lateral chest wall ICD. Heart size is normal. Scarring noted in the left base. No pleural fluid, interstitial edema or airspace consolidation. IMPRESSION: 1. No acute findings. 2. Left base scarring. Electronically Signed   By: Waddell Calk M.D.   On: 12/10/2023 11:44    Review of Systems  Constitutional:  Positive for activity change, appetite change, fatigue and unexpected weight change.  HENT: Negative.    Eyes: Negative.   Respiratory:  Positive for chest tightness and shortness of breath.   Cardiovascular:  Positive for chest pain.  Gastrointestinal:  Positive for abdominal pain. Negative for anal bleeding, blood in stool, constipation and diarrhea.  Endocrine: Negative.   Genitourinary: Negative.   Musculoskeletal:  Positive for arthralgias.  Allergic/Immunologic: Negative.   Neurological: Negative.   Hematological: Negative.   Psychiatric/Behavioral: Negative.     Blood pressure 106/69, pulse  63, temperature 98.5 F (36.9 C), temperature source Oral, resp. rate 16, height 5' 7 (1.702 m), weight 100.8 kg, SpO2 96%. Physical Exam Constitutional:      General: He is not in acute distress.    Appearance: He is well-developed.  HENT:     Head: Normocephalic and atraumatic.  Eyes:     Extraocular Movements: Extraocular movements intact.     Pupils: Pupils are equal, round, and reactive to light.  Cardiovascular:     Rate and Rhythm: Normal rate and regular rhythm.  Pulmonary:     Effort: Pulmonary effort is normal.     Breath sounds: Normal breath sounds.  Abdominal:     General: Bowel sounds are normal.     Palpations: Abdomen is soft.     Tenderness: There is abdominal tenderness.  Musculoskeletal:  Cervical back: Normal range of motion and neck supple.  Skin:    General: Skin is warm and dry.  Neurological:     General: No focal deficit present.     Mental Status: He is alert and oriented to person, place, and time.  Psychiatric:        Mood and Affect: Mood normal.        Behavior: Behavior normal.    Assessment/Plan: 1) Pancreatic head mass with nodal metastatic disease and elevated transaminases-check CA 19-9. Patient will need tissue diagnosis with possible EUS with FNA. This will be scheduled by the primary [facility] team tomorrow. 2) History of nonobstructive CAD with elevated troponins.  Patient had some chest pain with exertion.  Troponins will be trended. 3) HTN/combined systolic diastolic heart failure with EF of 30% History of ventricular tachycardia status post ICD 4) Emphysema on CT scan with a history of reactive airway disease 5) End-stage renal disease on dialysis 3 times a week. 6) Morbid obesity. Renaye Sous 12/11/2023, 10:26 AM

## 2023-12-11 NOTE — Progress Notes (Signed)
 PROGRESS NOTE    Victor Thompson  FMW:[id number] DOB: September 11, 1961 DOA: 12/10/2023 PCP: [name], MD  Outpatient Specialists:     Brief Narrative:  Patient is a 63 year old African-American male, with past medical history significant for ESRD on HD TTS schedule, ischemic cardiomyopathy, congestive heart failure with reduced EF (25 to 30%), history of CAD, VT storm status post ICD, currently on Eliquis  and amiodarone , reactive airway disease and hyperlipidemia.  Patient presented with epigastric pain, shortness of breath, cough and chest pain.  Further workup in the ED showed pancreatic mass concerning for malignancy.  Patient was transferred to [facility] as patient will likely need endoscopic ultrasound guided biopsy of pancreatic mass.  GI is directing care.  GI is okay with patient continuing Eliquis  for now.  12/11/2023: Patient seen.  Chest symptoms and epigastric pain have resolved.  No shortness of breath or chest pain.  Patient is awaiting EUS guided biopsy of the pancreatic mass.  GI input is highly appreciated.  Assessment & Plan:   Principal Problem:   Pancreatic mass Active Problems:   Combined congestive systolic and diastolic heart failure (HCC)   Reactive airway disease   ESRD (end stage renal disease) (HCC)   Hypotension   Hypokalemia   Elevated troponin   History of ventricular tachycardia s/p ICD   Chest pain   Pancreatic mass Transaminitis secondary to pancreatic mass > Patient presented to the emergency department with multiple complaints - epigastric pain, nausea, chest pain, weight loss, poor appetite, shortness of breath and cough. - In the ED patient found hypotensive otherwise hemodynamically stable -CBC showed stable H&H.  BMP showed low potassium 3 otherwise unremarkable. - Hepatic function showed elevated AST and ALT.  Normal alkaline phosphatase and bilirubin level.  Normal lipase. -CT abdomen showed pancreatic mass with multiple lymph  node concerning for pancreatic cancer. - ED physician has been consulted GI Dr. Rollin recommended transfer to [facility] and plan for endoscopic ultrasound biopsy.   - Plan to keep patient n.p.o. after midnight. - Continue Zofran  as needed for nausea and Protonix  twice daily. - Will follow-up with gastroenterology for further recommendation and plan. 12/11/2023: Patient seen by GI team over at [facility].  GI team is recommended to continue Eliquis  for now.  Not certain when EUS guided pancreatic biopsy will take place.    Chronic chest pain Elevated troponin History of nonobstructive CAD > Patient is complaining about chest pain both at rest and on exertion which has been ongoing for last 3 to 4 months.  Denies any associated palpitation, shortness of breath, nausea and diaphoresis. - Per chart review patient has history of CAD status post PCI 2005 and Cardiolite  2017 showed large fixed defect suggestive of prior infarction and EF 20%, left heart cath 07/25/2021 no significant CAD. - Initial troponin 43.  Per chart review patient baseline troponin around 5o to 100.  Chronic elevated troponin in the setting of ESRD patient. -Plan to continue to trend troponin and obtaining echocardiogram to assess wall motion abnormality. -If echocardiogram shows wall motion abnormality in that case need to consult cardiology inpatient. -Continue Coreg , Eliquis , Lipitor .  (Eliquis  on hold for tonight and tomorrow morning for possible endoscopic biopsy tomorrow). -Continue cardiac monitoring. 12/11/2023: Resolved.   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 - Continue Coreg  and BiDil .     History  of ventricular tachycardia status post ICD - Holding Eliquis  for tonight and tomorrow morning for endoscopic biopsy later sometime  tomorrow. -Continue amiodarone , mexiletine and Coreg .   Reactive airway disease Cough and shortness of breath - Respiratory panel ruled out COVID, flu flu and RSV.  Patient is not volume overloaded had dialysis today.  Chest x-ray unremarkable.  CTA chest unremarkable. - Continue with Breo Ellipta  twice daily and Xopenex  every 6 hour as needed for any wheezing shortness of breath 12/11/2023: Resolved significantly.   ESRD on dialysis TTS schedule -Last dialysis 12/09/2022.  Slight low potassium 3.  Not supplementing with potassium as ESRD patient.  Euvolemic on physical exam. - To be directed by nephrology team.  Potassium today is 3.3.  Repeat CMP in the morning.   VT prophylaxis:  SCDs.  On Eliquis  Code Status:  Full Code Diet: Renal diet.  N.p.o. after midnight Family Communication: None present Disposition Plan: Pending formal GI evaluation and endoscopic biopsy tomorrow Consults: Gastroenterology Admission status:   Inpatient, Telemetry bed   DVT prophylaxis: SCDs.  Patient is on Eliquis . ?Need to hold as patient may be going for EUS guided biopsy of the pancreas. Will liaise with GI team. Code Status: Full code Family Communication:  Disposition Plan:    Consultants:  GI.  Procedures:  Endoscopic ultrasound guided biopsy of the pancreas is planned.  Antimicrobials:  None   Subjective: No new complaints. No epigastric pain No shortness of breath.  Objective: Vitals:   12/11/23 0006 12/11/23 0424 12/11/23 0500 12/11/23 0738  BP: (!) 118/53 (!) 114/58  106/69  Pulse: 67 65  63  Resp: 17 17  16   Temp: 98.6 F (37 C) 98.6 F (37 C)  98.5 F (36.9 C)  TempSrc: Oral Oral  Oral  SpO2: 97% 93% 99% 96%  Weight:      Height:       No intake or output data in the 24 hours ending 12/11/23 1112 Filed Weights   12/10/23 1052  Weight: 100.8 kg    Examination:  General exam: Appears calm and comfortable.  Patient is obese. Respiratory system: Decreased air entry  globally.   Cardiovascular system: S1 & S2  Gastrointestinal system: Abdomen is obese, soft and nontender.   Central nervous system: Awake and alert.  Patient moves all extremities.   Extremities: No leg edema.  Data Reviewed: I have personally reviewed following labs and imaging studies  CBC: Recent Labs  Lab 12/10/23 1125 12/11/23 0216  WBC 4.3 4.9  HGB 12.2* 11.5*  HCT 36.5* 34.0*  MCV 88.4 88.5  PLT 240 180   Basic Metabolic Panel: Recent Labs  Lab 12/10/23 1339 12/11/23 0216  NA 139 139  K 3.0* 3.3*  CL 97* 100  CO2 32 29  GLUCOSE 90 121*  BUN 7 7  CREATININE 4.33* 5.67*  CALCIUM  7.9* 8.6*   GFR: Estimated Creatinine Clearance: 15.7 mL/min (A) (by C-G formula based on SCr of 5.67 mg/dL (H)). Liver Function Tests: Recent Labs  Lab 12/10/23 1339 12/11/23 0216  AST 249* 237*  ALT 183* 182*  ALKPHOS 83 97  BILITOT 0.9 0.9  PROT 5.6* 5.5*  ALBUMIN  2.2* 2.0*   Recent Labs  Lab 12/10/23 1339  LIPASE 40   No results for input(s): AMMONIA in the last 168 hours. Coagulation Profile: No results for input(s): INR, PROTIME in the last 168 hours. Cardiac Enzymes: No results for input(s): CKTOTAL, CKMB, CKMBINDEX, TROPONINI in the last 168 hours. BNP (last 3 results) No  results for input(s): PROBNP in the last 8760 hours. HbA1C: No results for input(s): HGBA1C in the last 72 hours. CBG: No results for input(s): GLUCAP in the last 168 hours. Lipid Profile: No results for input(s): CHOL, HDL, LDLCALC, TRIG, CHOLHDL, LDLDIRECT in the last 72 hours. Thyroid  Function Tests: No results for input(s): TSH, T4TOTAL, FREET4, T3FREE, THYROIDAB in the last 72 hours. Anemia Panel: No results for input(s): VITAMINB12, FOLATE, FERRITIN, TIBC, IRON, RETICCTPCT in the last 72 hours. Urine analysis:    Component Value Date/Time   COLORURINE YELLOW 03/04/2016 1019   APPEARANCEUR CLEAR 03/04/2016 1019   LABSPEC 1.016  03/04/2016 1019   PHURINE 7.5 03/04/2016 1019   GLUCOSEU NEGATIVE 03/04/2016 1019   GLUCOSEU NEG mg/dL 96/85/7988 7773   HGBUR TRACE (A) 03/04/2016 1019   HGBUR negative 10/01/2008 0929   BILIRUBINUR negative 08/25/2021 1504   KETONESUR negative 08/25/2021 1504   KETONESUR NEGATIVE 03/04/2016 1019   PROTEINUR >300 (A) 03/04/2016 1019   UROBILINOGEN 0.2 08/25/2021 1504   UROBILINOGEN 0.2 02/16/2010 2226   NITRITE Negative 08/25/2021 1504   NITRITE NEGATIVE 03/04/2016 1019   LEUKOCYTESUR Trace (A) 08/25/2021 1504   Sepsis Labs: @LABRCNTIP (procalcitonin:4,lacticidven:4)  ) Recent Results (from the past 240 hours)  Resp panel by RT-PCR (RSV, Flu A&B, Covid) Anterior Nasal Swab     Status: None   Collection Time: 12/10/23 10:58 AM   Specimen: Anterior Nasal Swab  Result Value Ref Range Status   SARS Coronavirus 2 by RT PCR NEGATIVE NEGATIVE Final    Comment: (NOTE) SARS-CoV-2 target nucleic acids are NOT DETECTED.  The SARS-CoV-2 RNA is generally detectable in upper respiratory specimens during the acute phase of infection. The lowest concentration of SARS-CoV-2 viral copies this assay can detect is 138 copies/mL. A negative result does not preclude SARS-Cov-2 infection and should not be used as the sole basis for treatment or other patient management decisions. A negative result may occur with  improper specimen collection/handling, submission of specimen other than nasopharyngeal swab, presence of viral mutation(s) within the areas targeted by this assay, and inadequate number of viral copies(<138 copies/mL). A negative result must be combined with clinical observations, patient history, and epidemiological information. The expected result is Negative.  Fact Sheet for Patients:  bloggercourse.com  Fact Sheet for Healthcare Providers:  seriousbroker.it  This test is no t yet approved or cleared by the [place]  FDA and   has been authorized for detection and/or diagnosis of SARS-CoV-2 by FDA under an Emergency Use Authorization (EUA). This EUA will remain  in effect (meaning this test can be used) for the duration of the COVID-19 declaration under Section 564(b)(1) of the Act, 21 U.S.C.section 360bbb-3(b)(1), unless the authorization is terminated  or revoked sooner.       Influenza A by PCR NEGATIVE NEGATIVE Final   Influenza B by PCR NEGATIVE NEGATIVE Final    Comment: (NOTE) The Xpert Xpress SARS-CoV-2/FLU/RSV plus assay is intended as an aid in the diagnosis of influenza from Nasopharyngeal swab specimens and should not be used as a sole basis for treatment. Nasal washings and aspirates are unacceptable for Xpert Xpress SARS-CoV-2/FLU/RSV testing.  Fact Sheet for Patients: bloggercourse.com  Fact Sheet for Healthcare Providers: seriousbroker.it  This test is not yet approved or cleared by the [place]  FDA and has been authorized for detection and/or diagnosis of SARS-CoV-2 by FDA under an Emergency Use Authorization (EUA). This EUA will remain in effect (meaning this test can be used) for the duration of the COVID-19  declaration under Section 564(b)(1) of the Act, 21 U.S.C. section 360bbb-3(b)(1), unless the authorization is terminated or revoked.     Resp Syncytial Virus by PCR NEGATIVE NEGATIVE Final    Comment: (NOTE) Fact Sheet for Patients: bloggercourse.com  Fact Sheet for Healthcare Providers: seriousbroker.it  This test is not yet approved or cleared by the [place]  FDA and has been authorized for detection and/or diagnosis of SARS-CoV-2 by FDA under an Emergency Use Authorization (EUA). This EUA will remain in effect (meaning this test can be used) for the duration of the COVID-19 declaration under Section 564(b)(1) of the Act, 21 U.S.C. section 360bbb-3(b)(1),  unless the authorization is terminated or revoked.  Performed at [facility], [address] [place], [place] [address]          Radiology Studies: CT Angio Chest PE W and/or Wo Contrast Result Date: 12/10/2023 CLINICAL DATA:  Cough, shortness of breath for 1 week * Tracking Code: BO * EXAM: CT ANGIOGRAPHY CHEST CT ABDOMEN AND PELVIS WITH CONTRAST TECHNIQUE: Multidetector CT imaging of the chest was performed using the standard protocol during bolus administration of intravenous contrast. Multiplanar CT image reconstructions and MIPs were obtained to evaluate the vascular anatomy. Multidetector CT imaging of the abdomen and pelvis was performed using the standard protocol during bolus administration of intravenous contrast. RADIATION DOSE REDUCTION: This exam was performed according to the departmental dose-optimization program which includes automated exposure control, adjustment of the mA and/or kV according to patient size and/or use of iterative reconstruction technique. CONTRAST:  80mL OMNIPAQUE  IOHEXOL  350 MG/ML SOLN COMPARISON:  None Available. FINDINGS: CT CHEST ANGIOGRAM FINDINGS Cardiovascular: Satisfactory opacification of the pulmonary arteries to the segmental level. No evidence of pulmonary embolism. Mild cardiomegaly. Three-vessel coronary artery calcifications. No pericardial effusion. Aortic atherosclerosis. Mediastinum/Nodes: No enlarged mediastinal, hilar, or axillary lymph nodes. Thyroid  gland, trachea, and esophagus demonstrate no significant findings. Lungs/Pleura: Minimal centrilobular emphysema. Mild diffuse bilateral bronchial wall thickening. No pleural effusion or pneumothorax. Musculoskeletal: Subcutaneous defibrillator. No acute osseous findings. Review of the MIP images confirms the above findings. CT ABDOMEN PELVIS FINDINGS Hepatobiliary: No solid liver abnormality is seen. 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 Hypodense lesion  adjacent the falciform ligament, measuring 2.0 x 1.8 cm (series 306, image 18). No gallstones, gallbladder wall thickening, or biliary dilatation. Pancreas: Large, heterogeneously hypodense mass which appears to arise from the pancreatic head but is also inseparable from the descending duodenum, measuring 5.0 x 3.8 x 4.7 cm (series 306, image 29, series 605, image 129). No pancreatic ductal dilatation or surrounding inflammatory changes. Spleen: Normal in size without significant abnormality. Adrenals/Urinary Tract: Adrenal glands are unremarkable. Atrophic kidneys. No hydronephrosis. Bladder is unremarkable. Stomach/Bowel: Stomach is within normal limits. Appendix appears normal. No evidence of bowel wall thickening, distention, or inflammatory changes. Vascular/Lymphatic: Aortic atherosclerosis. Enlarged gastrohepatic ligament and celiac axis lymph nodes measuring up to 2.0 x 1.4 cm (series 306, image 18). Numerous prominent retroperitoneal lymph nodes, left retroperitoneal nodes measuring up to 1.4 x 1.0 cm (series 306, image 29). Reproductive: No mass or other significant abnormality. Other: No abdominal wall hernia or abnormality. No ascites. Musculoskeletal: No acute or significant osseous findings. IMPRESSION: 1. Negative examination for pulmonary embolism. 2. Large, heterogeneously hypodense mass which appears to arise from the pancreatic head but is also inseparable from the descending duodenum, measuring 5.0 x 3.8 x 4.7 cm, highly concerning for malignancy. 3. Enlarged gastrohepatic ligament and celiac axis lymph nodes measuring up to 2.0 x  1.4 cm. Numerous prominent retroperitoneal lymph nodes, left retroperitoneal nodes measuring up to 1.4 x 1.0 cm. These are generally nonspecific although highly concerning for nodal metastatic disease. 4. Hypodense hepatic lesion adjacent to the falciform ligament, measuring 2.0 x 1.8 cm. Although this is generally characteristic in location and appearance for focal  fatty deposition, a hepatic metastasis is difficult to exclude given other findings. 5. 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 6. Emphysema and diffuse bilateral bronchial wall thickening. 7. Coronary artery disease. Aortic Atherosclerosis (ICD10-I70.0) and Emphysema (ICD10-J43.9). Electronically Signed   By: Marolyn JONETTA Jaksch M.D.   On: 12/10/2023 16:33   CT ABDOMEN PELVIS W CONTRAST Result Date: 12/10/2023 CLINICAL DATA:  Cough, shortness of breath for 1 week * Tracking Code: BO * EXAM: CT ANGIOGRAPHY CHEST CT ABDOMEN AND PELVIS WITH CONTRAST TECHNIQUE: Multidetector CT imaging of the chest was performed using the standard protocol during bolus administration of intravenous contrast. Multiplanar CT image reconstructions and MIPs were obtained to evaluate the vascular anatomy. Multidetector CT imaging of the abdomen and pelvis was performed using the standard protocol during bolus administration of intravenous contrast. RADIATION DOSE REDUCTION: This exam was performed according to the departmental dose-optimization program which includes automated exposure control, adjustment of the mA and/or kV according to patient size and/or use of iterative reconstruction technique. CONTRAST:  80mL OMNIPAQUE  IOHEXOL  350 MG/ML SOLN COMPARISON:  None Available. FINDINGS: CT CHEST ANGIOGRAM FINDINGS Cardiovascular: Satisfactory opacification of the pulmonary arteries to the segmental level. No evidence of pulmonary embolism. Mild cardiomegaly. Three-vessel coronary artery calcifications. No pericardial effusion. Aortic atherosclerosis. Mediastinum/Nodes: No enlarged mediastinal, hilar, or axillary lymph nodes. Thyroid  gland, trachea, and esophagus demonstrate no significant findings. Lungs/Pleura: Minimal centrilobular emphysema. Mild diffuse bilateral bronchial wall thickening. No pleural effusion or pneumothorax. Musculoskeletal: Subcutaneous defibrillator. No acute osseous findings. Review of the MIP  images confirms the above findings. CT ABDOMEN PELVIS FINDINGS Hepatobiliary: No solid liver abnormality is seen. 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 Hypodense lesion adjacent the falciform ligament, measuring 2.0 x 1.8 cm (series 306, image 18). No gallstones, gallbladder wall thickening, or biliary dilatation. Pancreas: Large, heterogeneously hypodense mass which appears to arise from the pancreatic head but is also inseparable from the descending duodenum, measuring 5.0 x 3.8 x 4.7 cm (series 306, image 29, series 605, image 129). No pancreatic ductal dilatation or surrounding inflammatory changes. Spleen: Normal in size without significant abnormality. Adrenals/Urinary Tract: Adrenal glands are unremarkable. Atrophic kidneys. No hydronephrosis. Bladder is unremarkable. Stomach/Bowel: Stomach is within normal limits. Appendix appears normal. No evidence of bowel wall thickening, distention, or inflammatory changes. Vascular/Lymphatic: Aortic atherosclerosis. Enlarged gastrohepatic ligament and celiac axis lymph nodes measuring up to 2.0 x 1.4 cm (series 306, image 18). Numerous prominent retroperitoneal lymph nodes, left retroperitoneal nodes measuring up to 1.4 x 1.0 cm (series 306, image 29). Reproductive: No mass or other significant abnormality. Other: No abdominal wall hernia or abnormality. No ascites. Musculoskeletal: No acute or significant osseous findings. IMPRESSION: 1. Negative examination for pulmonary embolism. 2. Large, heterogeneously hypodense mass which appears to arise from the pancreatic head but is also inseparable from the descending duodenum, measuring 5.0 x 3.8 x 4.7 cm, highly concerning for malignancy. 3. Enlarged gastrohepatic ligament and celiac axis lymph nodes measuring up to 2.0 x 1.4 cm. Numerous prominent retroperitoneal lymph nodes, left retroperitoneal nodes measuring up to 1.4 x 1.0 cm. These are generally nonspecific although highly concerning for  nodal metastatic disease. 4. Hypodense hepatic lesion adjacent to the  falciform ligament, measuring 2.0 x 1.8 cm. Although this is generally characteristic in location and appearance for focal fatty deposition, a hepatic metastasis is difficult to exclude given other findings. 5. 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 6. Emphysema and diffuse bilateral bronchial wall thickening. 7. Coronary artery disease. Aortic Atherosclerosis (ICD10-I70.0) and Emphysema (ICD10-J43.9). Electronically Signed   By: Marolyn JONETTA Jaksch M.D.   On: 12/10/2023 16:33   DG Chest 2 View Result Date: 12/10/2023 CLINICAL DATA:  Shortness of breath.  Cough for 1 week. EXAM: CHEST - 2 VIEW COMPARISON:  11/14/2023 FINDINGS: Unchanged position of left lateral chest wall ICD. Heart size is normal. Scarring noted in the left base. No pleural fluid, interstitial edema or airspace consolidation. IMPRESSION: 1. No acute findings. 2. Left base scarring. Electronically Signed   By: Waddell Calk M.D.   On: 12/10/2023 11:44        Scheduled Meds:  amiodarone   200 mg Oral BID   apixaban   5 mg Oral BID   atorvastatin   80 mg Oral QHS   carvedilol   6.25 mg Oral BID   docusate sodium   100 mg Oral BID   fluticasone  furoate-vilanterol  1 puff Inhalation Daily   isosorbide -hydrALAZINE   1 tablet Oral TID   mexiletine  300 mg Oral BID   pantoprazole   40 mg Oral BID   sodium chloride  flush  3 mL Intravenous Q12H   Continuous Infusions:  sodium chloride        LOS: 1 day    Time spent: 35 minutes.    Leatrice Chapel, MD  [facility] Pager #: [phone] 7PM-7AM contact night coverage as above

## 2023-12-11 NOTE — Progress Notes (Signed)
 Echocardiogram 2D Echocardiogram has been performed.  Victor Thompson 12/11/2023, 3:19 PM

## 2023-12-12 ENCOUNTER — Encounter (HOSPITAL_COMMUNITY): Payer: Self-pay | Admitting: Internal Medicine

## 2023-12-12 DIAGNOSIS — R7989 Other specified abnormal findings of blood chemistry: Secondary | ICD-10-CM | POA: Diagnosis not present

## 2023-12-12 DIAGNOSIS — R109 Unspecified abdominal pain: Secondary | ICD-10-CM

## 2023-12-12 DIAGNOSIS — D649 Anemia, unspecified: Secondary | ICD-10-CM

## 2023-12-12 DIAGNOSIS — Z992 Dependence on renal dialysis: Secondary | ICD-10-CM

## 2023-12-12 DIAGNOSIS — N186 End stage renal disease: Secondary | ICD-10-CM | POA: Diagnosis not present

## 2023-12-12 DIAGNOSIS — K8689 Other specified diseases of pancreas: Secondary | ICD-10-CM | POA: Diagnosis not present

## 2023-12-12 LAB — CBC
HCT: 32 % — ABNORMAL LOW (ref 39.0–52.0)
Hemoglobin: 10.7 g/dL — ABNORMAL LOW (ref 13.0–17.0)
MCH: 29.5 pg (ref 26.0–34.0)
MCHC: 33.4 g/dL (ref 30.0–36.0)
MCV: 88.2 fL (ref 80.0–100.0)
Platelets: 177 10*3/uL (ref 150–400)
RBC: 3.63 MIL/uL — ABNORMAL LOW (ref 4.22–5.81)
RDW: 17.2 % — ABNORMAL HIGH (ref 11.5–15.5)
WBC: 3.9 10*3/uL — ABNORMAL LOW (ref 4.0–10.5)
nRBC: 0 % (ref 0.0–0.2)

## 2023-12-12 LAB — HEPATITIS B SURFACE ANTIGEN: Hepatitis B Surface Ag: NONREACTIVE

## 2023-12-12 LAB — COMPREHENSIVE METABOLIC PANEL
ALT: 164 U/L — ABNORMAL HIGH (ref 0–44)
AST: 176 U/L — ABNORMAL HIGH (ref 15–41)
Albumin: 2 g/dL — ABNORMAL LOW (ref 3.5–5.0)
Alkaline Phosphatase: 88 U/L (ref 38–126)
Anion gap: 11 (ref 5–15)
BUN: 10 mg/dL (ref 6–20)
CO2: 29 mmol/L (ref 22–32)
Calcium: 8.8 mg/dL — ABNORMAL LOW (ref 8.9–10.3)
Chloride: 98 mmol/L (ref 98–111)
Creatinine, Ser: 7.81 mg/dL — ABNORMAL HIGH (ref 0.61–1.24)
GFR, Estimated: 7 mL/min — ABNORMAL LOW (ref >60)
Glucose, Bld: 82 mg/dL (ref 70–99)
Potassium: 3.4 mmol/L — ABNORMAL LOW (ref 3.5–5.1)
Sodium: 138 mmol/L (ref 135–145)
Total Bilirubin: 1.1 mg/dL (ref 0.0–1.2)
Total Protein: 5.7 g/dL — ABNORMAL LOW (ref 6.5–8.1)

## 2023-12-12 LAB — CANCER ANTIGEN 19-9
CA 19-9: <2 U/mL (ref 0–35)
CA 19-9: <2 U/mL (ref 0–35)

## 2023-12-12 LAB — PHOSPHORUS: Phosphorus: 3.5 mg/dL (ref 2.5–4.6)

## 2023-12-12 MED ORDER — POTASSIUM CHLORIDE 20 MEQ PO PACK
20.0000 meq | PACK | Freq: Once | ORAL | Status: AC
  Administered 2023-12-12: 20 meq via ORAL
  Filled 2023-12-12: qty 1

## 2023-12-12 MED ORDER — LANTHANUM CARBONATE 500 MG PO CHEW
1000.0000 mg | CHEWABLE_TABLET | Freq: Two times a day (BID) | ORAL | Status: DC
  Administered 2023-12-12 – 2023-12-25 (×5): 1000 mg via ORAL
  Filled 2023-12-12 (×30): qty 2

## 2023-12-12 MED ORDER — HEPARIN (PORCINE) 25000 UT/250ML-% IV SOLN
1200.0000 [IU]/h | INTRAVENOUS | Status: DC
  Administered 2023-12-12: 1200 [IU]/h via INTRAVENOUS
  Filled 2023-12-12: qty 250

## 2023-12-12 MED ORDER — LANTHANUM CARBONATE 500 MG PO CHEW
2000.0000 mg | CHEWABLE_TABLET | Freq: Three times a day (TID) | ORAL | Status: DC
  Administered 2023-12-12 – 2023-12-25 (×17): 2000 mg via ORAL
  Filled 2023-12-12 (×44): qty 4

## 2023-12-12 MED ORDER — HYDROXYZINE HCL 25 MG PO TABS
25.0000 mg | ORAL_TABLET | Freq: Three times a day (TID) | ORAL | Status: DC
  Administered 2023-12-12 – 2023-12-26 (×29): 25 mg via ORAL
  Filled 2023-12-12 (×34): qty 1

## 2023-12-12 MED ORDER — CHLORHEXIDINE GLUCONATE CLOTH 2 % EX PADS
6.0000 | MEDICATED_PAD | Freq: Every day | CUTANEOUS | Status: DC
  Administered 2023-12-13 – 2023-12-19 (×5): 6 via TOPICAL

## 2023-12-12 NOTE — Consult Note (Signed)
 Renal Service Consult Note [facility]  BOWYN MERCIER 12/12/2023 Lamar JONETTA Fret, MD Requesting Physician: Dr. Rosario  Reason for Consult: ESRD pt w/  HPI: The patient is a 63 y.o. year-old w/ PMH of chronic syst CHF, sp AICD, h/o cardiac arrest, CAD, HTN, OSA who presented to ED yest c/o abd pain, SOB, chest pain. W/u in ED w/ CT showed pancreatic mass concerning for malignancy. GI consulting. We are asked to see for dialysis.   Pt seen in room. No c/o's at this time. Lives w/ his 1st cousin. No HD transport issues. Last HD was this past Saturday.  ROS - denies CP, no joint pain, no HA, no blurry vision, no rash, no diarrhea, no nausea/ vomiting   Past Medical History  Past Medical History:  Diagnosis Date   Acute on chronic systolic CHF (congestive heart failure) (HCC) 06/14/2020   Acute respiratory failure (HCC) 05/28/2022   AICD (automatic cardioverter/defibrillator) present    2015   Allergy     Asthma    no meds   Cardiac arrest (HCC) 2015   Chest pain 05/25/2022   CHF (congestive heart failure) (HCC)    Coronary artery disease    ESRD on hemodialysis (HCC)    ckd -stage 5   Hyperlipidemia    Hypertension    Myocardial infarction (HCC)    Obesity    Pneumonia    Shortness of breath dyspnea    Sleep apnea    USES CPAP   Wears glasses    Past Surgical History  Past Surgical History:  Procedure Laterality Date   AV FISTULA PLACEMENT Right 03/15/2019   Procedure: RIGHT ARM ARTERIOVENOUS (AV) FISTULA CREATION;  Surgeon: Eliza Lonni RAMAN, MD;  Location: [facility] OR;  Service: Vascular;  Laterality: Right;   COLONOSCOPY     COLONOSCOPY W/ BIOPSIES AND POLYPECTOMY     COLONOSCOPY WITH PROPOFOL  N/A 09/28/2023   Procedure: COLONOSCOPY WITH PROPOFOL ;  Surgeon: Leigh Elspeth SQUIBB, MD;  Location: [facility];  Service: Gastroenterology;  Laterality: N/A;   CORONARY STENT PLACEMENT  2007   IMPLANTABLE CARDIOVERTER DEFIBRILLATOR IMPLANT  2015   POLYPECTOMY   09/28/2023   Procedure: POLYPECTOMY;  Surgeon: Leigh Elspeth SQUIBB, MD;  Location: [facility] ENDOSCOPY;  Service: Gastroenterology;;   RIGHT/LEFT HEART CATH AND CORONARY ANGIOGRAPHY N/A 07/08/2021   Procedure: RIGHT/LEFT HEART CATH AND CORONARY ANGIOGRAPHY;  Surgeon: Rolan Ezra RAMAN, MD;  Location: [facility] INVASIVE CV LAB;  Service: Cardiovascular;  Laterality: N/A;   RIGHT/LEFT HEART CATH AND CORONARY ANGIOGRAPHY N/A 08/25/2022   Procedure: RIGHT/LEFT HEART CATH AND CORONARY ANGIOGRAPHY;  Surgeon: Rolan Ezra RAMAN, MD;  Location: [facility] INVASIVE CV LAB;  Service: Cardiovascular;  Laterality: N/A;   SUBQ ICD CHANGEOUT N/A 10/13/2020   Procedure: SUBQ ICD CHANGEOUT;  Surgeon: Waddell Danelle ORN, MD;  Location: [facility] INVASIVE CV LAB;  Service: Cardiovascular;  Laterality: N/A;   Family History  Family History  Problem Relation Age of Onset   Hypertension Mother    Liver disease Father    Colon cancer Neg Hx    Esophageal cancer Neg Hx    Rectal cancer Neg Hx    Stomach cancer Neg Hx    Colon polyps Neg Hx    Social History  reports that he has never smoked. He has never used smokeless tobacco. He reports that he does not drink alcohol and does not use drugs. Allergies  Allergies  Allergen Reactions   Ace Inhibitors Swelling    Swelling of the tongue  Influenza Vaccines Hives and Swelling    SWELLING REACTION UNSPECIFIED    Ketorolac Swelling    SWELLING REACTION UNSPECIFIED    Lidocaine  Swelling    TONGUE SWELLS   Lisinopril Swelling    TONGUE SWELLING Pt reported problem with a BP med which sounded like  lisinopril  But as of 09/19/06,pt had tolerated altace without problem   Penicillins Swelling    TONGUE SWELLS Has patient had a PCN reaction causing immediate rash, facial/tongue/throat swelling, SOB or lightheadedness with hypotension: Yes Has patient had a PCN reaction causing severe rash involving mucus membranes or skin necrosis: No Has patient had a PCN reaction that required hospitalization  No Has patient had a PCN reaction occurring within the last 10 years: Yes If all of the above answers are NO, then may proceed with Cephalosporin use.    Home medications Prior to Admission medications   Medication Sig Start Date End Date Taking? Authorizing Provider  albuterol  (PROVENTIL ) (2.5 MG/3ML) 0.083% nebulizer solution INHALE THE CONTENTS OF 1 VIAL BY MOUTH EVERY 6 HOURS AS NEEDED 10/04/23  Yes [name], MD  albuterol  (VENTOLIN  HFA) 108 (90 Base) MCG/ACT inhaler INHALE TWO PUFFS BY MOUTH EVERY 4 HOURS AS NEEDED 04/14/23  Yes [name], MD  ALLERGY  RELIEF CETIRIZINE  10 MG tablet Take 10 mg by mouth 2 (two) times daily. 08/23/23  Yes [provider]  AMBULATORY NON FORMULARY MEDICATION Medication Name: diltiazem 2% / lidocaine  5% ointment - pea sized amount PR TID 09/28/23  Yes Armbruster, Elspeth SQUIBB, MD  amiodarone  (PACERONE ) 200 MG tablet Take 1 tablet (200 mg total) by mouth 2 (two) times daily. 10/03/23  Yes Rolan Ezra RAMAN, MD  apixaban  (ELIQUIS ) 5 MG TABS tablet NEW PRESCRIPTION REQUEST: Eliquis  5 Mg - TAKE ONE TABLET BY MOUTH TWICE DAILY 10/03/23  Yes Rolan Ezra RAMAN, MD  atorvastatin  (LIPITOR ) 80 MG tablet Take 1 tablet (80 mg total) by mouth at bedtime. 11/08/23 11/07/24 Yes Rolan Ezra RAMAN, MD  budesonide -formoterol  (SYMBICORT ) 80-4.5 MCG/ACT inhaler INHALE TWO PUFFS BY MOUTH TWICE DAILY. 11/23/23  Yes [name], MD  carvedilol  (COREG ) 6.25 MG tablet Take 6.25 mg by mouth 2 (two) times daily. 07/27/23  Yes [provider]  Cholecalciferol  125 MCG (5000 UT) TABS Take 1 tablet (5,000 Units total) by mouth daily. 12/01/22  Yes [name], MD  docusate sodium  (COLACE) 100 MG capsule TAKE ONE CAPSULE BY MOUTH DAILY 07/27/23  Yes [name], MD  EPINEPHrine  0.3 mg/0.3 mL IJ SOAJ injection Inject 0.3 mg into the muscle once as needed for up to 2 doses (if worsening tongue swelling, SOB, hypoxia, or other concerns for progressive  anaphylaxis). 12/01/22  Yes [name], MD  ethyl chloride spray SPRAY A SMALL AMOUNT THREE TIMES A WEEK JUST PRIOR TO NEEDLE INSERTION 06/27/23  Yes [name], MD  fluticasone  (FLONASE ) 50 MCG/ACT nasal spray INSTILL 1 SPRAY IN EACH NOTRIL DAILY AS NEEDED 10/04/23  Yes [name], MD  hydrOXYzine  (ATARAX ) 25 MG tablet Take 3 tablets (75 mg total) by mouth at bedtime. 06/27/23  Yes [name], MD  isosorbide -hydrALAZINE  (BIDIL ) 20-37.5 MG tablet TAKE 1/2 TABLET BY MOUTH THREE TIMES DAILY ON MONDAY, WEDNESDAY, FRIDAY AND SUNDAY 12/08/23  Yes Milford, Harlene HERO, FNP  lanthanum  (FOSRENOL ) 1000 MG chewable tablet Chew 2 tablets (2,000 mg total) by mouth 3 (three) times daily AND 1 tablet (1,000 mg total) 2 (two) times daily as needed with snacks 06/14/23  Yes  Dennise Hoes, MD  linaclotide  (LINZESS ) 72 MCG capsule TAKE ONE CAPSULE BY MOUTH ON MONDAY, WEDNESDAY, FRIDAY AND SATURDAY 10/19/23  Yes [name], MD  mexiletine (MEXITIL ) 150 MG capsule Take 2 capsules (300 mg total) by mouth 2 (two) times daily. 10/03/23  Yes Rolan Ezra RAMAN, MD  multivitamin (RENA-VIT) TABS tablet Take 1 tablet by mouth daily. 08/23/23  Yes [provider]  ondansetron  (ZOFRAN ) 4 MG tablet Take 1 tablet (4 mg total) by mouth 3 (three) times daily before meals. 12/01/23  Yes Beather Delon Gibson, PA  pantoprazole  (PROTONIX ) 40 MG tablet Take 1 tablet (40 mg total) by mouth 2 (two) times daily before a meal. 12/01/23  Yes Beather Delon Gibson, PA  Polyethylene Glycol 3350  (PEG 3350 ) 17 g PACK NEW PRESCRIPTION REQUEST: Polyethylene Glycol 3350 - 1 PACKET BY MOUTH DAILY AS NEEDED 12/01/22  Yes [name], MD  tadalafil  (CIALIS ) 10 MG tablet Take 1 tablet (10 mg total) by mouth daily as needed 30 min prior to sexual activity.  Do not take with Imdur  (isosorbide ) or nitroglycerin  02/22/23  Yes   doxycycline  (VIBRA -TABS) 100 MG tablet Take 1 tablet (100 mg total) by mouth 2 (two)  times daily as directed Patient not taking: Reported on 12/11/2023 11/15/23        Vitals:   12/12/23 0500 12/12/23 0504 12/12/23 0727 12/12/23 0900  BP:  126/63 119/66 (!) 142/76  Pulse:  62 67 68  Resp:   20 16  Temp:  98.7 F (37.1 C) 98.7 F (37.1 C) 98.6 F (37 C)  TempSrc:  Oral  Oral  SpO2:  96% 96% 100%  Weight: 103.8 kg     Height:       Exam Gen alert, no distress No rash, cyanosis or gangrene Sclera anicteric, throat clear  No jvd or bruits Chest clear bilat to bases, no rales/ wheezing RRR no MRG Abd soft ntnd no mass or ascites +bs GU defer MS no joint effusions or deformity Ext no LE or UE edema, no wounds or ulcers Neuro is alert, Ox 3 , nf    AVF+bruit      Renal-related home meds: - eliquis  5 bid - coreg  6.25 bid, bidil  20-37.5 bid - fosrenol  2 gm ac tid - others: mexiletine 300 bid, PPI, symbicort , statin, amiodarone     [facility]   4h   450 /1.5  102kg  2/2 bath   AVF   Heparin  3000 - hect 9 micrograms three times per week - no esa  Assessment/ Plan: Pancreatic mass - w/ abd pain, CT +for mass a head of pancreas, also multiple local enlarged LN's. GI consulting, plan for endo biopsy tomorrow.  ESKD - on HD TTS. HD tomorrow, schedule around GI procedure.  HTN - BP's are wnl, cont home meds Volume - no edema on exam but pt thinks he has fluid on. Max UF w/ HD tomorrow.  Anemia of eskd - Hb 11, not on esa at OP unit. Follow.  Secondary hyperparathyroidism - CCa in range. Cont binders w/ meals.  H/o combined heart failure H/o VT storm - sp ICD and takes mexilitene, BB and amiodarone       Myer Fret  MD [facility] 12/12/2023, 12:46 PM  Recent Labs  Lab 12/11/23 0216 12/12/23 0551  HGB 11.5* 10.7*  ALBUMIN  2.0* 2.0*  CALCIUM  8.6* 8.8*  CREATININE 5.67* 7.81*  K 3.3* 3.4*   Inpatient medications:  amiodarone   200 mg Oral BID   atorvastatin   80 mg Oral QHS   carvedilol   6.25 mg Oral BID   docusate sodium   100 mg Oral BID    fluticasone  furoate-vilanterol  1 puff Inhalation Daily   hydrOXYzine   25 mg Oral TID   isosorbide -hydrALAZINE   1 tablet Oral TID   lanthanum   2,000 mg Oral TID WC   And   lanthanum   1,000 mg Oral BID BM   mexiletine  300 mg Oral BID   pantoprazole   40 mg Oral BID   polyethylene glycol  17 g Oral Daily   sodium chloride  flush  3 mL Intravenous Q12H    acetaminophen  **OR** acetaminophen , levalbuterol , ondansetron  **OR** ondansetron  (ZOFRAN ) IV, sodium chloride  flush

## 2023-12-12 NOTE — Progress Notes (Signed)
 PROGRESS NOTE    Victor Thompson  FMW:[id number] DOB: [birth year] DOA: 12/10/2023 PCP: [name], MD  Outpatient Specialists:     Brief Narrative:  Patient is a 63 year old African-American male, with past medical history significant for ESRD on HD TTS schedule, ischemic cardiomyopathy, congestive heart failure with reduced EF (25 to 30%), history of CAD, VT storm status post ICD, currently on Eliquis  and amiodarone , reactive airway disease and hyperlipidemia.  Patient presented with epigastric pain, shortness of breath, cough and chest pain.  Further workup in the ED showed pancreatic mass concerning for malignancy.  Patient was transferred to [facility] as patient will likely need endoscopic ultrasound guided biopsy of pancreatic mass.  GI is directing care.    12/12/2023: Patient is awaiting EUS guided biopsy of the pancreatic mass.  GI input is highly appreciated.  Will hold Eliquis .  Will start patient on heparin  drip (bridge)  Assessment & Plan:   Principal Problem:   Pancreatic mass Active Problems:   Combined congestive systolic and diastolic heart failure (HCC)   Reactive airway disease   ESRD (end stage renal disease) (HCC)   Hypotension   Hypokalemia   Elevated troponin   History of ventricular tachycardia s/p ICD   Chest pain   Pancreatic mass Transaminitis secondary to pancreatic mass > Patient presented to the emergency department with multiple complaints - epigastric pain, nausea, chest pain, weight loss, poor appetite, shortness of breath and cough. -CBC showed stable H&H.  BMP showed low potassium 3 otherwise unremarkable. - Hepatic function showed elevated AST and ALT.  Normal alkaline phosphatase and bilirubin level.  Normal lipase. -CT abdomen showed pancreatic mass with multiple lymph node concerning for pancreatic cancer. - ED physician consulted GI team, Dr. Rollin, and Dr. Rollin recommended transfer to [facility] and plan for endoscopic  ultrasound biopsy.   - Patient was on Eliquis  prior to presentation. -Hold Eliquis .  Start patient on heparin  bridge. -GI team to arrange EUS guided pancreatic biopsy. -Patient continues to report right upper quadrant pain.    Chronic chest pain Elevated troponin History of nonobstructive CAD > Patient is complaining about chest pain both at rest and on exertion which has been ongoing for last 3 to 4 months.  Denies any associated palpitation, shortness of breath, nausea and diaphoresis. - Per chart review patient has history of CAD status post PCI 2005 and Cardiolite  2017 showed large fixed defect suggestive of prior infarction and EF 20%, left heart cath 07/25/2021 no significant CAD. - Initial troponin 43.  Per chart review patient baseline troponin around 50 to 100.  Chronic elevated troponin in the setting of ESRD patient. -Plan to continue to trend troponin and obtaining echocardiogram to assess wall motion abnormality. -If echocardiogram shows wall motion abnormality in that case need to consult cardiology inpatient. -Continue Coreg , Eliquis , Lipitor .  (Eliquis  on hold for tonight and tomorrow morning for possible endoscopic biopsy tomorrow). -Continue cardiac monitoring. 12/12/2023: Resolved.  Echocardiogram revealed ejection fraction of 30 to 35%.   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 - Continue Coreg  and BiDil .     History of ventricular tachycardia status post ICD - Will hold Eliquis . -Will bridge with heparin .   -Continue amiodarone , mexiletine and Coreg .   Reactive airway disease Cough and shortness of breath - Respiratory panel ruled out COVID, flu flu and RSV.  Patient  is not volume overloaded had dialysis today.  Chest x-ray unremarkable.  CTA chest unremarkable. - Continue with Breo Ellipta  twice  daily and Xopenex  every 6 hour as needed for any wheezing shortness of breath 12/11/2023: Resolved significantly.   ESRD on dialysis TTS schedule -Last dialysis 12/09/2022.  Slight low potassium 3.  Not supplementing with potassium as ESRD patient.  Euvolemic on physical exam. - To be directed by nephrology team.  Potassium today is 3.3.  Repeat CMP in the morning.   VT prophylaxis:  SCDs.  On Eliquis  Code Status:  Full Code Diet: Renal diet.  N.p.o. after midnight Family Communication: None present Disposition Plan: Pending formal GI evaluation and endoscopic biopsy tomorrow Consults: Gastroenterology Admission status:   Inpatient, Telemetry bed   DVT prophylaxis: SCDs.  Patient is on Eliquis . ?Need to hold as patient may be going for EUS guided biopsy of the pancreas. Will liaise with GI team. Code Status: Full code Family Communication:  Disposition Plan:    Consultants:  GI.  Procedures:  Endoscopic ultrasound guided biopsy of the pancreas is planned.  Antimicrobials:  None   Subjective: No new complaints. No epigastric pain No shortness of breath.  Objective: Vitals:   12/12/23 0500 12/12/23 0504 12/12/23 0727 12/12/23 0900  BP:  126/63 119/66 (!) 142/76  Pulse:  62 67 68  Resp:   20 16  Temp:  98.7 F (37.1 C) 98.7 F (37.1 C) 98.6 F (37 C)  TempSrc:  Oral  Oral  SpO2:  96% 96% 100%  Weight: 103.8 kg     Height:       No intake or output data in the 24 hours ending 12/12/23 0948 Filed Weights   12/10/23 1052 12/12/23 0500  Weight: 100.8 kg 103.8 kg    Examination:  General exam: Appears calm and comfortable.  Patient is obese. Respiratory system: Decreased air entry globally.   Cardiovascular system: S1 & S2  Gastrointestinal system: Abdomen is obese, soft and nontender.   Central nervous system: Awake and alert.  Patient moves all extremities.   Extremities: No leg edema.  Data Reviewed: I have personally reviewed following labs and imaging  studies  CBC: Recent Labs  Lab 12/10/23 1125 12/11/23 0216 12/12/23 0551  WBC 4.3 4.9 3.9*  HGB 12.2* 11.5* 10.7*  HCT 36.5* 34.0* 32.0*  MCV 88.4 88.5 88.2  PLT 240 180 177   Basic Metabolic Panel: Recent Labs  Lab 12/10/23 1339 12/11/23 0216 12/12/23 0551  NA 139 139 138  K 3.0* 3.3* 3.4*  CL 97* 100 98  CO2 32 29 29  GLUCOSE 90 121* 82  BUN 7 7 10   CREATININE 4.33* 5.67* 7.81*  CALCIUM  7.9* 8.6* 8.8*   GFR: Estimated Creatinine Clearance: 11.6 mL/min (A) (by C-G formula based on SCr of 7.81 mg/dL (H)). Liver Function Tests: Recent Labs  Lab 12/10/23 1339 12/11/23 0216 12/12/23 0551  AST 249* 237* 176*  ALT 183* 182* 164*  ALKPHOS 83 97 88  BILITOT 0.9 0.9 1.1  PROT 5.6* 5.5* 5.7*  ALBUMIN  2.2* 2.0* 2.0*   Recent Labs  Lab 12/10/23 1339  LIPASE 40   No results for input(s): AMMONIA in the last 168 hours. Coagulation Profile: No results for input(s): INR, PROTIME in the last 168 hours. Cardiac Enzymes: No results for input(s): CKTOTAL, CKMB, CKMBINDEX, TROPONINI in the last 168 hours. BNP (last 3 results) No results for input(s): PROBNP in the last 8760 hours. HbA1C: No results for input(s): HGBA1C in the last  72 hours. CBG: No results for input(s): GLUCAP in the last 168 hours. Lipid Profile: No results for input(s): CHOL, HDL, LDLCALC, TRIG, CHOLHDL, LDLDIRECT in the last 72 hours. Thyroid  Function Tests: No results for input(s): TSH, T4TOTAL, FREET4, T3FREE, THYROIDAB in the last 72 hours. Anemia Panel: No results for input(s): VITAMINB12, FOLATE, FERRITIN, TIBC, IRON, RETICCTPCT in the last 72 hours. Urine analysis:    Component Value Date/Time   COLORURINE YELLOW 03/04/2016 1019   APPEARANCEUR CLEAR 03/04/2016 1019   LABSPEC 1.016 03/04/2016 1019   PHURINE 7.5 03/04/2016 1019   GLUCOSEU NEGATIVE 03/04/2016 1019   GLUCOSEU NEG mg/dL 96/85/7988 7773   HGBUR TRACE (A) 03/04/2016 1019    HGBUR negative 10/01/2008 0929   BILIRUBINUR negative 08/25/2021 1504   KETONESUR negative 08/25/2021 1504   KETONESUR NEGATIVE 03/04/2016 1019   PROTEINUR >300 (A) 03/04/2016 1019   UROBILINOGEN 0.2 08/25/2021 1504   UROBILINOGEN 0.2 02/16/2010 2226   NITRITE Negative 08/25/2021 1504   NITRITE NEGATIVE 03/04/2016 1019   LEUKOCYTESUR Trace (A) 08/25/2021 1504   Sepsis Labs: @LABRCNTIP (procalcitonin:4,lacticidven:4)  ) Recent Results (from the past 240 hours)  Resp panel by RT-PCR (RSV, Flu A&B, Covid) Anterior Nasal Swab     Status: None   Collection Time: 12/10/23 10:58 AM   Specimen: Anterior Nasal Swab  Result Value Ref Range Status   SARS Coronavirus 2 by RT PCR NEGATIVE NEGATIVE Final    Comment: (NOTE) SARS-CoV-2 target nucleic acids are NOT DETECTED.  The SARS-CoV-2 RNA is generally detectable in upper respiratory specimens during the acute phase of infection. The lowest concentration of SARS-CoV-2 viral copies this assay can detect is 138 copies/mL. A negative result does not preclude SARS-Cov-2 infection and should not be used as the sole basis for treatment or other patient management decisions. A negative result may occur with  improper specimen collection/handling, submission of specimen other than nasopharyngeal swab, presence of viral mutation(s) within the areas targeted by this assay, and inadequate number of viral copies(<138 copies/mL). A negative result must be combined with clinical observations, patient history, and epidemiological information. The expected result is Negative.  Fact Sheet for Patients:  bloggercourse.com  Fact Sheet for Healthcare Providers:  seriousbroker.it  This test is no t yet approved or cleared by the [place]  FDA and  has been authorized for detection and/or diagnosis of SARS-CoV-2 by FDA under an Emergency Use Authorization (EUA). This EUA will remain  in effect (meaning  this test can be used) for the duration of the COVID-19 declaration under Section 564(b)(1) of the Act, 21 U.S.C.section 360bbb-3(b)(1), unless the authorization is terminated  or revoked sooner.       Influenza A by PCR NEGATIVE NEGATIVE Final   Influenza B by PCR NEGATIVE NEGATIVE Final    Comment: (NOTE) The Xpert Xpress SARS-CoV-2/FLU/RSV plus assay is intended as an aid in the diagnosis of influenza from Nasopharyngeal swab specimens and should not be used as a sole basis for treatment. Nasal washings and aspirates are unacceptable for Xpert Xpress SARS-CoV-2/FLU/RSV testing.  Fact Sheet for Patients: bloggercourse.com  Fact Sheet for Healthcare Providers: seriousbroker.it  This test is not yet approved or cleared by the [place]  FDA and has been authorized for detection and/or diagnosis of SARS-CoV-2 by FDA under an Emergency Use Authorization (EUA). This EUA will remain in effect (meaning this test can be used) for the duration of the COVID-19 declaration under Section 564(b)(1) of the Act, 21 U.S.C. section 360bbb-3(b)(1), unless the authorization is terminated or revoked.  Resp Syncytial Virus by PCR NEGATIVE NEGATIVE Final    Comment: (NOTE) Fact Sheet for Patients: bloggercourse.com  Fact Sheet for Healthcare Providers: seriousbroker.it  This test is not yet approved or cleared by the [place]  FDA and has been authorized for detection and/or diagnosis of SARS-CoV-2 by FDA under an Emergency Use Authorization (EUA). This EUA will remain in effect (meaning this test can be used) for the duration of the COVID-19 declaration under Section 564(b)(1) of the Act, 21 U.S.C. section 360bbb-3(b)(1), unless the authorization is terminated or revoked.  Performed at [facility], [address] [place], [place] [address]          Radiology  Studies: ECHOCARDIOGRAM COMPLETE Result Date: 12/11/2023    ECHOCARDIOGRAM REPORT   Patient Name:   Victor Thompson Date of Exam: 12/11/2023 Medical Rec #:  [id number]      Height:       67.0 in Accession #:    [id number]     Weight:       222.2 lb Date of Birth:  November 16, 1961     BSA:          2.115 m Patient Age:    62 years       BP:           106/69 mmHg Patient Gender: M              HR:           65 bpm. Exam Location:  Inpatient Procedure: 2D Echo, Cardiac Doppler and Color Doppler Indications:    Elevated Troponin  History:        Patient has prior history of Echocardiogram examinations, most                 recent 12/01/2023. Cardiomyopathy and CHF, CAD, COPD and ESRD,                 Arrythmias:Atrial Fibrillation and Tachycardia,                 Signs/Symptoms:Hypotension and Chest Pain; Risk                 Factors:Hypertension, Dyslipidemia, Sleep Apnea and Current                 Smoker.  Sonographer:    Thea Norlander RCS Referring Phys: SUBRINA SUNDIL IMPRESSIONS  1. Left ventricular ejection fraction, by estimation, is 30 to 35%. The left ventricle has moderately decreased function. The left ventricle demonstrates global hypokinesis. The left ventricular internal cavity size was moderately dilated. Left ventricular diastolic parameters are consistent with Grade I diastolic dysfunction (impaired relaxation).  2. Right ventricular systolic function is normal. The right ventricular size is normal. Tricuspid regurgitation signal is inadequate for assessing PA pressure.  3. The mitral valve is abnormal. Mild mitral valve regurgitation. No evidence of mitral stenosis.  4. The tricuspid valve is abnormal.  5. The aortic valve is tricuspid. Aortic valve regurgitation is not visualized. No aortic stenosis is present.  6. Aortic dilatation noted. There is mild dilatation of the ascending aorta, measuring 36 mm.  7. The inferior vena cava is normal in size with greater than 50% respiratory variability,  suggesting right atrial pressure of 3 mmHg. FINDINGS  Left Ventricle: Left ventricular ejection fraction, by estimation, is 30 to 35%. The left ventricle has moderately decreased function. The left ventricle demonstrates global hypokinesis. The left ventricular internal cavity size was moderately dilated. There is no  left ventricular hypertrophy. Left ventricular diastolic parameters are consistent with Grade I diastolic dysfunction (impaired relaxation). Normal left ventricular filling pressure. Right Ventricle: The right ventricular size is normal. Right vetricular wall thickness was not well visualized. Right ventricular systolic function is normal. Tricuspid regurgitation signal is inadequate for assessing PA pressure. Left Atrium: Left atrial size was normal in size. Right Atrium: Right atrial size was normal in size. Pericardium: There is no evidence of pericardial effusion. Mitral Valve: The mitral valve is abnormal. Mild mitral valve regurgitation. No evidence of mitral valve stenosis. Tricuspid Valve: The tricuspid valve is abnormal. Tricuspid valve regurgitation is mild . No evidence of tricuspid stenosis. Aortic Valve: The aortic valve is tricuspid. Aortic valve regurgitation is not visualized. No aortic stenosis is present. Aortic valve mean gradient measures 4.8 mmHg. Aortic valve peak gradient measures 9.1 mmHg. Aortic valve area, by VTI measures 2.38 cm. Pulmonic Valve: The pulmonic valve was not well visualized. Pulmonic valve regurgitation is not visualized. No evidence of pulmonic stenosis. Aorta: The aortic root is normal in size and structure and aortic dilatation noted. There is mild dilatation of the ascending aorta, measuring 36 mm. Venous: The inferior vena cava is normal in size with greater than 50% respiratory variability, suggesting right atrial pressure of 3 mmHg. IAS/Shunts: No atrial level shunt detected by color flow Doppler.  LEFT VENTRICLE PLAX 2D LVIDd:         6.50 cm       Diastology LVIDs:         5.40 cm      LV e' medial:    5.00 cm/s LV PW:         0.80 cm      LV E/e' medial:  10.7 LV IVS:        0.60 cm      LV e' lateral:   7.94 cm/s LVOT diam:     2.20 cm      LV E/e' lateral: 6.7 LV SV:         67 LV SV Index:   32 LVOT Area:     3.80 cm  LV Volumes (MOD) LV vol d, MOD A2C: 246.0 ml LV vol d, MOD A4C: 186.0 ml LV vol s, MOD A2C: 147.0 ml LV vol s, MOD A4C: 114.0 ml LV SV MOD A2C:     99.0 ml LV SV MOD A4C:     186.0 ml LV SV MOD BP:      94.5 ml RIGHT VENTRICLE             IVC RV S prime:     15.00 cm/s  IVC diam: 1.10 cm TAPSE (M-mode): 2.5 cm LEFT ATRIUM           Index        RIGHT ATRIUM           Index LA diam:      4.40 cm 2.08 cm/m   RA Area:     19.70 cm LA Vol (A2C): 47.7 ml 22.55 ml/m  RA Volume:   59.40 ml  28.09 ml/m LA Vol (A4C): 52.8 ml 24.97 ml/m  AORTIC VALVE AV Area (Vmax):    2.45 cm AV Area (Vmean):   2.33 cm AV Area (VTI):     2.38 cm AV Vmax:           150.49 cm/s AV Vmean:          101.273 cm/s AV VTI:  0.282 m AV Peak Grad:      9.1 mmHg AV Mean Grad:      4.8 mmHg LVOT Vmax:         96.93 cm/s LVOT Vmean:        62.000 cm/s LVOT VTI:          0.176 m LVOT/AV VTI ratio: 0.63  AORTA Ao Root diam: 3.20 cm Ao Asc diam:  3.60 cm MITRAL VALVE MV Area (PHT): 4.31 cm    SHUNTS MV Decel Time: 176 msec    Systemic VTI:  0.18 m MR Peak grad: 87.6 mmHg    Systemic Diam: 2.20 cm MR Vmax:      468.00 cm/s MV E velocity: 53.50 cm/s MV A velocity: 85.90 cm/s MV E/A ratio:  0.62 Dorn Ross MD Electronically signed by Dorn Ross MD Signature Date/Time: 12/11/2023/1:31:06 PM    Final    CT Angio Chest PE W and/or Wo Contrast Result Date: 12/10/2023 CLINICAL DATA:  Cough, shortness of breath for 1 week * Tracking Code: BO * EXAM: CT ANGIOGRAPHY CHEST CT ABDOMEN AND PELVIS WITH CONTRAST TECHNIQUE: Multidetector CT imaging of the chest was performed using the standard protocol during bolus administration of intravenous contrast. Multiplanar CT  image reconstructions and MIPs were obtained to evaluate the vascular anatomy. Multidetector CT imaging of the abdomen and pelvis was performed using the standard protocol during bolus administration of intravenous contrast. RADIATION DOSE REDUCTION: This exam was performed according to the departmental dose-optimization program which includes automated exposure control, adjustment of the mA and/or kV according to patient size and/or use of iterative reconstruction technique. CONTRAST:  80mL OMNIPAQUE  IOHEXOL  350 MG/ML SOLN COMPARISON:  None Available. FINDINGS: CT CHEST ANGIOGRAM FINDINGS Cardiovascular: Satisfactory opacification of the pulmonary arteries to the segmental level. No evidence of pulmonary embolism. Mild cardiomegaly. Three-vessel coronary artery calcifications. No pericardial effusion. Aortic atherosclerosis. Mediastinum/Nodes: No enlarged mediastinal, hilar, or axillary lymph nodes. Thyroid  gland, trachea, and esophagus demonstrate no significant findings. Lungs/Pleura: Minimal centrilobular emphysema. Mild diffuse bilateral bronchial wall thickening. No pleural effusion or pneumothorax. Musculoskeletal: Subcutaneous defibrillator. No acute osseous findings. Review of the MIP images confirms the above findings. CT ABDOMEN PELVIS FINDINGS Hepatobiliary: No solid liver abnormality is seen. 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 Hypodense lesion adjacent the falciform ligament, measuring 2.0 x 1.8 cm (series 306, image 18). No gallstones, gallbladder wall thickening, or biliary dilatation. Pancreas: Large, heterogeneously hypodense mass which appears to arise from the pancreatic head but is also inseparable from the descending duodenum, measuring 5.0 x 3.8 x 4.7 cm (series 306, image 29, series 605, image 129). No pancreatic ductal dilatation or surrounding inflammatory changes. Spleen: Normal in size without significant abnormality. Adrenals/Urinary Tract: Adrenal glands are  unremarkable. Atrophic kidneys. No hydronephrosis. Bladder is unremarkable. Stomach/Bowel: Stomach is within normal limits. Appendix appears normal. No evidence of bowel wall thickening, distention, or inflammatory changes. Vascular/Lymphatic: Aortic atherosclerosis. Enlarged gastrohepatic ligament and celiac axis lymph nodes measuring up to 2.0 x 1.4 cm (series 306, image 18). Numerous prominent retroperitoneal lymph nodes, left retroperitoneal nodes measuring up to 1.4 x 1.0 cm (series 306, image 29). Reproductive: No mass or other significant abnormality. Other: No abdominal wall hernia or abnormality. No ascites. Musculoskeletal: No acute or significant osseous findings. IMPRESSION: 1. Negative examination for pulmonary embolism. 2. Large, heterogeneously hypodense mass which appears to arise from the pancreatic head but is also inseparable from the descending duodenum, measuring 5.0 x 3.8 x 4.7 cm, highly concerning for malignancy.  3. Enlarged gastrohepatic ligament and celiac axis lymph nodes measuring up to 2.0 x 1.4 cm. Numerous prominent retroperitoneal lymph nodes, left retroperitoneal nodes measuring up to 1.4 x 1.0 cm. These are generally nonspecific although highly concerning for nodal metastatic disease. 4. Hypodense hepatic lesion adjacent to the falciform ligament, measuring 2.0 x 1.8 cm. Although this is generally characteristic in location and appearance for focal fatty deposition, a hepatic metastasis is difficult to exclude given other findings. 5. 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 6. Emphysema and diffuse bilateral bronchial wall thickening. 7. Coronary artery disease. Aortic Atherosclerosis (ICD10-I70.0) and Emphysema (ICD10-J43.9). Electronically Signed   By: Marolyn JONETTA Jaksch M.D.   On: 12/10/2023 16:33   CT ABDOMEN PELVIS W CONTRAST Result Date: 12/10/2023 CLINICAL DATA:  Cough, shortness of breath for 1 week * Tracking Code: BO * EXAM: CT ANGIOGRAPHY CHEST CT  ABDOMEN AND PELVIS WITH CONTRAST TECHNIQUE: Multidetector CT imaging of the chest was performed using the standard protocol during bolus administration of intravenous contrast. Multiplanar CT image reconstructions and MIPs were obtained to evaluate the vascular anatomy. Multidetector CT imaging of the abdomen and pelvis was performed using the standard protocol during bolus administration of intravenous contrast. RADIATION DOSE REDUCTION: This exam was performed according to the departmental dose-optimization program which includes automated exposure control, adjustment of the mA and/or kV according to patient size and/or use of iterative reconstruction technique. CONTRAST:  80mL OMNIPAQUE  IOHEXOL  350 MG/ML SOLN COMPARISON:  None Available. FINDINGS: CT CHEST ANGIOGRAM FINDINGS Cardiovascular: Satisfactory opacification of the pulmonary arteries to the segmental level. No evidence of pulmonary embolism. Mild cardiomegaly. Three-vessel coronary artery calcifications. No pericardial effusion. Aortic atherosclerosis. Mediastinum/Nodes: No enlarged mediastinal, hilar, or axillary lymph nodes. Thyroid  gland, trachea, and esophagus demonstrate no significant findings. Lungs/Pleura: Minimal centrilobular emphysema. Mild diffuse bilateral bronchial wall thickening. No pleural effusion or pneumothorax. Musculoskeletal: Subcutaneous defibrillator. No acute osseous findings. Review of the MIP images confirms the above findings. CT ABDOMEN PELVIS FINDINGS Hepatobiliary: No solid liver abnormality is seen. 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 Hypodense lesion adjacent the falciform ligament, measuring 2.0 x 1.8 cm (series 306, image 18). No gallstones, gallbladder wall thickening, or biliary dilatation. Pancreas: Large, heterogeneously hypodense mass which appears to arise from the pancreatic head but is also inseparable from the descending duodenum, measuring 5.0 x 3.8 x 4.7 cm (series 306, image 29,  series 605, image 129). No pancreatic ductal dilatation or surrounding inflammatory changes. Spleen: Normal in size without significant abnormality. Adrenals/Urinary Tract: Adrenal glands are unremarkable. Atrophic kidneys. No hydronephrosis. Bladder is unremarkable. Stomach/Bowel: Stomach is within normal limits. Appendix appears normal. No evidence of bowel wall thickening, distention, or inflammatory changes. Vascular/Lymphatic: Aortic atherosclerosis. Enlarged gastrohepatic ligament and celiac axis lymph nodes measuring up to 2.0 x 1.4 cm (series 306, image 18). Numerous prominent retroperitoneal lymph nodes, left retroperitoneal nodes measuring up to 1.4 x 1.0 cm (series 306, image 29). Reproductive: No mass or other significant abnormality. Other: No abdominal wall hernia or abnormality. No ascites. Musculoskeletal: No acute or significant osseous findings. IMPRESSION: 1. Negative examination for pulmonary embolism. 2. Large, heterogeneously hypodense mass which appears to arise from the pancreatic head but is also inseparable from the descending duodenum, measuring 5.0 x 3.8 x 4.7 cm, highly concerning for malignancy. 3. Enlarged gastrohepatic ligament and celiac axis lymph nodes measuring up to 2.0 x 1.4 cm. Numerous prominent retroperitoneal lymph nodes, left retroperitoneal nodes measuring up to 1.4 x 1.0 cm. These are generally nonspecific although  highly concerning for nodal metastatic disease. 4. Hypodense hepatic lesion adjacent to the falciform ligament, measuring 2.0 x 1.8 cm. Although this is generally characteristic in location and appearance for focal fatty deposition, a hepatic metastasis is difficult to exclude given other findings. 5. 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 6. Emphysema and diffuse bilateral bronchial wall thickening. 7. Coronary artery disease. Aortic Atherosclerosis (ICD10-I70.0) and Emphysema (ICD10-J43.9). Electronically Signed   By: Marolyn JONETTA Jaksch  M.D.   On: 12/10/2023 16:33   DG Chest 2 View Result Date: 12/10/2023 CLINICAL DATA:  Shortness of breath.  Cough for 1 week. EXAM: CHEST - 2 VIEW COMPARISON:  11/14/2023 FINDINGS: Unchanged position of left lateral chest wall ICD. Heart size is normal. Scarring noted in the left base. No pleural fluid, interstitial edema or airspace consolidation. IMPRESSION: 1. No acute findings. 2. Left base scarring. Electronically Signed   By: Waddell Calk M.D.   On: 12/10/2023 11:44        Scheduled Meds:  amiodarone   200 mg Oral BID   apixaban   5 mg Oral BID   atorvastatin   80 mg Oral QHS   carvedilol   6.25 mg Oral BID   docusate sodium   100 mg Oral BID   fluticasone  furoate-vilanterol  1 puff Inhalation Daily   hydrOXYzine   25 mg Oral TID   isosorbide -hydrALAZINE   1 tablet Oral TID   lanthanum   2,000 mg Oral TID WC   And   lanthanum   1,000 mg Oral BID BM   mexiletine  300 mg Oral BID   pantoprazole   40 mg Oral BID   polyethylene glycol  17 g Oral Daily   potassium chloride   20 mEq Oral Once   sodium chloride  flush  3 mL Intravenous Q12H   Continuous Infusions:     LOS: 2 days    Time spent: 35 minutes.    Leatrice Chapel, MD  [facility] Pager #: [phone] 7PM-7AM contact night coverage as above

## 2023-12-12 NOTE — Progress Notes (Signed)
 PHARMACY - ANTICOAGULATION CONSULT NOTE  Pharmacy Consult for Heparin  (Eliquis  on hold) Indication: atrial fibrillation  Allergies  Allergen Reactions   Ace Inhibitors Swelling    Swelling of the tongue   Influenza Vaccines Hives and Swelling    SWELLING REACTION UNSPECIFIED    Ketorolac Swelling    SWELLING REACTION UNSPECIFIED    Lidocaine  Swelling    TONGUE SWELLS   Lisinopril Swelling    TONGUE SWELLING Pt reported problem with a BP med which sounded like  lisinopril  But as of 09/19/06,pt had tolerated altace without problem   Penicillins Swelling    TONGUE SWELLS Has patient had a PCN reaction causing immediate rash, facial/tongue/throat swelling, SOB or lightheadedness with hypotension: Yes Has patient had a PCN reaction causing severe rash involving mucus membranes or skin necrosis: No Has patient had a PCN reaction that required hospitalization No Has patient had a PCN reaction occurring within the last 10 years: Yes If all of the above answers are NO, then may proceed with Cephalosporin use.     Patient Measurements: Height: 5' 7 (170.2 cm) Weight: 103.8 kg (228 lb 13.4 oz) IBW/kg (Calculated) : 66.1 Heparin  Dosing Weight: 89 kg  Vital Signs: Temp: 97.7 F (36.5 C) (01/06 1516) Temp Source: Oral (01/06 1516) BP: 119/69 (01/06 1516) Pulse Rate: 63 (01/06 1516)  Labs: Recent Labs    12/10/23 1125 12/10/23 1339 12/10/23 2329 12/11/23 0216 12/11/23 0400 12/12/23 0551  HGB 12.2*  --   --  11.5*  --  10.7*  HCT 36.5*  --   --  34.0*  --  32.0*  PLT 240  --   --  180  --  177  CREATININE  --  4.33*  --  5.67*  --  7.81*  TROPONINIHS 43*  --  50* 47* 47*  --     Estimated Creatinine Clearance: 11.6 mL/min (A) (by C-G formula based on SCr of 7.81 mg/dL (H)).  Assessment: 63 yr old male on Eliquis  5 mg BID prior to admission for hx atrial fibrillation.  Eliquis  now held for EUS with FN biopsy of pancreatic head mass, possibly on 1/8 per GI. Pharmacy  consulted to begin IV heparin  bridge.    Last Eliquis  dose at 0902 this am.  Heparin  drip to begin ~12 hours later.  Will use aPTTs for monitoring while heparin  levels are expected to be falsely elevated due to recent Eliquis  doses.  Goal of Therapy:  Heparin  level 0.3-0.7 units/ml aPTT 66-102 seconds Monitor platelets by anticoagulation protocol: Yes   Plan:  Begin heparin  drip at 9pm tonight at 1200 units/hr. aPTT and heparin  level ~8 hours after drip starts. Daily aPTT and heparin  level until correlating; daily CBC. Eliquis  on hold. Will follow up for timing for holding IV heparin  prior to procedure.  Genaro Zebedee Calin, RPh 12/12/2023,3:56 PM

## 2023-12-12 NOTE — Progress Notes (Addendum)
 Progress Note  Primary GI: Dr. Leigh DOA: 12/10/2023         Hospital Day: 3   Subjective  Chief Complaint: Epigastric pain, shortness of breath, chest pain  No family was present at the time of my evaluation. Patient continues to have right upper quadrant and epigastric discomfort worse after food mild nausea but no vomiting. No shortness of breath, chest pain. He did receive Eliquis  this morning, and did have a food tray, he did have something to drink but denies eating anything yet. Patient is been passing gas but states he has not had a bowel movement 2 days.   Objective   Vital signs in last 24 hours: Temp:  [98.2 F (36.8 C)-98.7 F (37.1 C)] 98.6 F (37 C) (01/06 0900) Pulse Rate:  [62-68] 68 (01/06 0900) Resp:  [16-20] 16 (01/06 0900) BP: (117-142)/(61-80) 142/76 (01/06 0900) SpO2:  [95 %-100 %] 100 % (01/06 0900) Weight:  [103.8 kg] 103.8 kg (01/06 0500) Last BM Date : 12/09/23 Last BM recorded by nurses in past 5 days No data recorded  General:   male in no acute distress  Heart:  Regular rate and rhythm; no murmurs Pulm: Clear anteriorly; no wheezing Abdomen:  Soft, Obese AB, Sluggish bowel sounds. moderate tenderness in the epigastrium and in the RUQ. Without guarding and Without rebound, No organomegaly appreciated. Extremities:  without  edema. Neurologic:  Alert and  oriented x4;  No focal deficits.  Psych:  Cooperative. Normal mood and affect.  Intake/Output from previous day: No intake/output data recorded. Intake/Output this shift: No intake/output data recorded.  Studies/Results: ECHOCARDIOGRAM COMPLETE Result Date: 12/11/2023    ECHOCARDIOGRAM REPORT   Patient Name:   JAIRUS TONNE Date of Exam: 12/11/2023 Medical Rec #:  [id number]      Height:       67.0 in Accession #:    [id number]     Weight:       222.2 lb Date of Birth:  23-May-1961     BSA:          2.115 m Patient Age:    62 years       BP:           106/69 mmHg Patient Gender: M               HR:           65 bpm. Exam Location:  Inpatient Procedure: 2D Echo, Cardiac Doppler and Color Doppler Indications:    Elevated Troponin  History:        Patient has prior history of Echocardiogram examinations, most                 recent 12/01/2023. Cardiomyopathy and CHF, CAD, COPD and ESRD,                 Arrythmias:Atrial Fibrillation and Tachycardia,                 Signs/Symptoms:Hypotension and Chest Pain; Risk                 Factors:Hypertension, Dyslipidemia, Sleep Apnea and Current                 Smoker.  Sonographer:    Thea Norlander RCS Referring Phys: SUBRINA SUNDIL IMPRESSIONS  1. Left ventricular ejection fraction, by estimation, is 30 to 35%. The left ventricle has moderately decreased function. The left ventricle demonstrates global hypokinesis. The left ventricular internal cavity size was  moderately dilated. Left ventricular diastolic parameters are consistent with Grade I diastolic dysfunction (impaired relaxation).  2. Right ventricular systolic function is normal. The right ventricular size is normal. Tricuspid regurgitation signal is inadequate for assessing PA pressure.  3. The mitral valve is abnormal. Mild mitral valve regurgitation. No evidence of mitral stenosis.  4. The tricuspid valve is abnormal.  5. The aortic valve is tricuspid. Aortic valve regurgitation is not visualized. No aortic stenosis is present.  6. Aortic dilatation noted. There is mild dilatation of the ascending aorta, measuring 36 mm.  7. The inferior vena cava is normal in size with greater than 50% respiratory variability, suggesting right atrial pressure of 3 mmHg. FINDINGS  Left Ventricle: Left ventricular ejection fraction, by estimation, is 30 to 35%. The left ventricle has moderately decreased function. The left ventricle demonstrates global hypokinesis. The left ventricular internal cavity size was moderately dilated. There is no left ventricular hypertrophy. Left ventricular diastolic parameters are  consistent with Grade I diastolic dysfunction (impaired relaxation). Normal left ventricular filling pressure. Right Ventricle: The right ventricular size is normal. Right vetricular wall thickness was not well visualized. Right ventricular systolic function is normal. Tricuspid regurgitation signal is inadequate for assessing PA pressure. Left Atrium: Left atrial size was normal in size. Right Atrium: Right atrial size was normal in size. Pericardium: There is no evidence of pericardial effusion. Mitral Valve: The mitral valve is abnormal. Mild mitral valve regurgitation. No evidence of mitral valve stenosis. Tricuspid Valve: The tricuspid valve is abnormal. Tricuspid valve regurgitation is mild . No evidence of tricuspid stenosis. Aortic Valve: The aortic valve is tricuspid. Aortic valve regurgitation is not visualized. No aortic stenosis is present. Aortic valve mean gradient measures 4.8 mmHg. Aortic valve peak gradient measures 9.1 mmHg. Aortic valve area, by VTI measures 2.38 cm. Pulmonic Valve: The pulmonic valve was not well visualized. Pulmonic valve regurgitation is not visualized. No evidence of pulmonic stenosis. Aorta: The aortic root is normal in size and structure and aortic dilatation noted. There is mild dilatation of the ascending aorta, measuring 36 mm. Venous: The inferior vena cava is normal in size with greater than 50% respiratory variability, suggesting right atrial pressure of 3 mmHg. IAS/Shunts: No atrial level shunt detected by color flow Doppler.  LEFT VENTRICLE PLAX 2D LVIDd:         6.50 cm      Diastology LVIDs:         5.40 cm      LV e' medial:    5.00 cm/s LV PW:         0.80 cm      LV E/e' medial:  10.7 LV IVS:        0.60 cm      LV e' lateral:   7.94 cm/s LVOT diam:     2.20 cm      LV E/e' lateral: 6.7 LV SV:         67 LV SV Index:   32 LVOT Area:     3.80 cm  LV Volumes (MOD) LV vol d, MOD A2C: 246.0 ml LV vol d, MOD A4C: 186.0 ml LV vol s, MOD A2C: 147.0 ml LV vol s, MOD  A4C: 114.0 ml LV SV MOD A2C:     99.0 ml LV SV MOD A4C:     186.0 ml LV SV MOD BP:      94.5 ml RIGHT VENTRICLE             IVC  RV S prime:     15.00 cm/s  IVC diam: 1.10 cm TAPSE (M-mode): 2.5 cm LEFT ATRIUM           Index        RIGHT ATRIUM           Index LA diam:      4.40 cm 2.08 cm/m   RA Area:     19.70 cm LA Vol (A2C): 47.7 ml 22.55 ml/m  RA Volume:   59.40 ml  28.09 ml/m LA Vol (A4C): 52.8 ml 24.97 ml/m  AORTIC VALVE AV Area (Vmax):    2.45 cm AV Area (Vmean):   2.33 cm AV Area (VTI):     2.38 cm AV Vmax:           150.49 cm/s AV Vmean:          101.273 cm/s AV VTI:            0.282 m AV Peak Grad:      9.1 mmHg AV Mean Grad:      4.8 mmHg LVOT Vmax:         96.93 cm/s LVOT Vmean:        62.000 cm/s LVOT VTI:          0.176 m LVOT/AV VTI ratio: 0.63  AORTA Ao Root diam: 3.20 cm Ao Asc diam:  3.60 cm MITRAL VALVE MV Area (PHT): 4.31 cm    SHUNTS MV Decel Time: 176 msec    Systemic VTI:  0.18 m MR Peak grad: 87.6 mmHg    Systemic Diam: 2.20 cm MR Vmax:      468.00 cm/s MV E velocity: 53.50 cm/s MV A velocity: 85.90 cm/s MV E/A ratio:  0.62 Dorn Ross MD Electronically signed by Dorn Ross MD Signature Date/Time: 12/11/2023/1:31:06 PM    Final    CT Angio Chest PE W and/or Wo Contrast Result Date: 12/10/2023 CLINICAL DATA:  Cough, shortness of breath for 1 week * Tracking Code: BO * EXAM: CT ANGIOGRAPHY CHEST CT ABDOMEN AND PELVIS WITH CONTRAST TECHNIQUE: Multidetector CT imaging of the chest was performed using the standard protocol during bolus administration of intravenous contrast. Multiplanar CT image reconstructions and MIPs were obtained to evaluate the vascular anatomy. Multidetector CT imaging of the abdomen and pelvis was performed using the standard protocol during bolus administration of intravenous contrast. RADIATION DOSE REDUCTION: This exam was performed according to the departmental dose-optimization program which includes automated exposure control, adjustment of the mA  and/or kV according to patient size and/or use of iterative reconstruction technique. CONTRAST:  80mL OMNIPAQUE  IOHEXOL  350 MG/ML SOLN COMPARISON:  None Available. FINDINGS: CT CHEST ANGIOGRAM FINDINGS Cardiovascular: Satisfactory opacification of the pulmonary arteries to the segmental level. No evidence of pulmonary embolism. Mild cardiomegaly. Three-vessel coronary artery calcifications. No pericardial effusion. Aortic atherosclerosis. Mediastinum/Nodes: No enlarged mediastinal, hilar, or axillary lymph nodes. Thyroid  gland, trachea, and esophagus demonstrate no significant findings. Lungs/Pleura: Minimal centrilobular emphysema. Mild diffuse bilateral bronchial wall thickening. No pleural effusion or pneumothorax. Musculoskeletal: Subcutaneous defibrillator. No acute osseous findings. Review of the MIP images confirms the above findings. CT ABDOMEN PELVIS FINDINGS Hepatobiliary: No solid liver abnormality is seen. 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 Hypodense lesion adjacent the falciform ligament, measuring 2.0 x 1.8 cm (series 306, image 18). No gallstones, gallbladder wall thickening, or biliary dilatation. Pancreas: Large, heterogeneously hypodense mass which appears to arise from the pancreatic head but is also inseparable from the descending duodenum, measuring 5.0 x  3.8 x 4.7 cm (series 306, image 29, series 605, image 129). No pancreatic ductal dilatation or surrounding inflammatory changes. Spleen: Normal in size without significant abnormality. Adrenals/Urinary Tract: Adrenal glands are unremarkable. Atrophic kidneys. No hydronephrosis. Bladder is unremarkable. Stomach/Bowel: Stomach is within normal limits. Appendix appears normal. No evidence of bowel wall thickening, distention, or inflammatory changes. Vascular/Lymphatic: Aortic atherosclerosis. Enlarged gastrohepatic ligament and celiac axis lymph nodes measuring up to 2.0 x 1.4 cm (series 306, image 18). Numerous  prominent retroperitoneal lymph nodes, left retroperitoneal nodes measuring up to 1.4 x 1.0 cm (series 306, image 29). Reproductive: No mass or other significant abnormality. Other: No abdominal wall hernia or abnormality. No ascites. Musculoskeletal: No acute or significant osseous findings. IMPRESSION: 1. Negative examination for pulmonary embolism. 2. Large, heterogeneously hypodense mass which appears to arise from the pancreatic head but is also inseparable from the descending duodenum, measuring 5.0 x 3.8 x 4.7 cm, highly concerning for malignancy. 3. Enlarged gastrohepatic ligament and celiac axis lymph nodes measuring up to 2.0 x 1.4 cm. Numerous prominent retroperitoneal lymph nodes, left retroperitoneal nodes measuring up to 1.4 x 1.0 cm. These are generally nonspecific although highly concerning for nodal metastatic disease. 4. Hypodense hepatic lesion adjacent to the falciform ligament, measuring 2.0 x 1.8 cm. Although this is generally characteristic in location and appearance for focal fatty deposition, a hepatic metastasis is difficult to exclude given other findings. 5. 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 6. Emphysema and diffuse bilateral bronchial wall thickening. 7. Coronary artery disease. Aortic Atherosclerosis (ICD10-I70.0) and Emphysema (ICD10-J43.9). Electronically Signed   By: Marolyn JONETTA Jaksch M.D.   On: 12/10/2023 16:33   CT ABDOMEN PELVIS W CONTRAST Result Date: 12/10/2023 CLINICAL DATA:  Cough, shortness of breath for 1 week * Tracking Code: BO * EXAM: CT ANGIOGRAPHY CHEST CT ABDOMEN AND PELVIS WITH CONTRAST TECHNIQUE: Multidetector CT imaging of the chest was performed using the standard protocol during bolus administration of intravenous contrast. Multiplanar CT image reconstructions and MIPs were obtained to evaluate the vascular anatomy. Multidetector CT imaging of the abdomen and pelvis was performed using the standard protocol during bolus administration  of intravenous contrast. RADIATION DOSE REDUCTION: This exam was performed according to the departmental dose-optimization program which includes automated exposure control, adjustment of the mA and/or kV according to patient size and/or use of iterative reconstruction technique. CONTRAST:  80mL OMNIPAQUE  IOHEXOL  350 MG/ML SOLN COMPARISON:  None Available. FINDINGS: CT CHEST ANGIOGRAM FINDINGS Cardiovascular: Satisfactory opacification of the pulmonary arteries to the segmental level. No evidence of pulmonary embolism. Mild cardiomegaly. Three-vessel coronary artery calcifications. No pericardial effusion. Aortic atherosclerosis. Mediastinum/Nodes: No enlarged mediastinal, hilar, or axillary lymph nodes. Thyroid  gland, trachea, and esophagus demonstrate no significant findings. Lungs/Pleura: Minimal centrilobular emphysema. Mild diffuse bilateral bronchial wall thickening. No pleural effusion or pneumothorax. Musculoskeletal: Subcutaneous defibrillator. No acute osseous findings. Review of the MIP images confirms the above findings. CT ABDOMEN PELVIS FINDINGS Hepatobiliary: No solid liver abnormality is seen. 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 Hypodense lesion adjacent the falciform ligament, measuring 2.0 x 1.8 cm (series 306, image 18). No gallstones, gallbladder wall thickening, or biliary dilatation. Pancreas: Large, heterogeneously hypodense mass which appears to arise from the pancreatic head but is also inseparable from the descending duodenum, measuring 5.0 x 3.8 x 4.7 cm (series 306, image 29, series 605, image 129). No pancreatic ductal dilatation or surrounding inflammatory changes. Spleen: Normal in size without significant abnormality. Adrenals/Urinary Tract: Adrenal glands are unremarkable. Atrophic kidneys. No  hydronephrosis. Bladder is unremarkable. Stomach/Bowel: Stomach is within normal limits. Appendix appears normal. No evidence of bowel wall thickening, distention,  or inflammatory changes. Vascular/Lymphatic: Aortic atherosclerosis. Enlarged gastrohepatic ligament and celiac axis lymph nodes measuring up to 2.0 x 1.4 cm (series 306, image 18). Numerous prominent retroperitoneal lymph nodes, left retroperitoneal nodes measuring up to 1.4 x 1.0 cm (series 306, image 29). Reproductive: No mass or other significant abnormality. Other: No abdominal wall hernia or abnormality. No ascites. Musculoskeletal: No acute or significant osseous findings. IMPRESSION: 1. Negative examination for pulmonary embolism. 2. Large, heterogeneously hypodense mass which appears to arise from the pancreatic head but is also inseparable from the descending duodenum, measuring 5.0 x 3.8 x 4.7 cm, highly concerning for malignancy. 3. Enlarged gastrohepatic ligament and celiac axis lymph nodes measuring up to 2.0 x 1.4 cm. Numerous prominent retroperitoneal lymph nodes, left retroperitoneal nodes measuring up to 1.4 x 1.0 cm. These are generally nonspecific although highly concerning for nodal metastatic disease. 4. Hypodense hepatic lesion adjacent to the falciform ligament, measuring 2.0 x 1.8 cm. Although this is generally characteristic in location and appearance for focal fatty deposition, a hepatic metastasis is difficult to exclude given other findings. 5. 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 6. Emphysema and diffuse bilateral bronchial wall thickening. 7. Coronary artery disease. Aortic Atherosclerosis (ICD10-I70.0) and Emphysema (ICD10-J43.9). Electronically Signed   By: Marolyn JONETTA Jaksch M.D.   On: 12/10/2023 16:33   DG Chest 2 View Result Date: 12/10/2023 CLINICAL DATA:  Shortness of breath.  Cough for 1 week. EXAM: CHEST - 2 VIEW COMPARISON:  11/14/2023 FINDINGS: Unchanged position of left lateral chest wall ICD. Heart size is normal. Scarring noted in the left base. No pleural fluid, interstitial edema or airspace consolidation. IMPRESSION: 1. No acute findings. 2.  Left base scarring. Electronically Signed   By: Waddell Calk M.D.   On: 12/10/2023 11:44    Lab Results: Recent Labs    12/10/23 1125 12/11/23 0216 12/12/23 0551  WBC 4.3 4.9 3.9*  HGB 12.2* 11.5* 10.7*  HCT 36.5* 34.0* 32.0*  PLT 240 180 177   BMET Recent Labs    12/10/23 1339 12/11/23 0216 12/12/23 0551  NA 139 139 138  K 3.0* 3.3* 3.4*  CL 97* 100 98  CO2 32 29 29  GLUCOSE 90 121* 82  BUN 7 7 10   CREATININE 4.33* 5.67* 7.81*  CALCIUM  7.9* 8.6* 8.8*   LFT Recent Labs    12/10/23 1339 12/11/23 0216 12/12/23 0551  PROT 5.6*   < > 5.7*  ALBUMIN  2.2*   < > 2.0*  AST 249*   < > 176*  ALT 183*   < > 164*  ALKPHOS 83   < > 88  BILITOT 0.9   < > 1.1  BILIDIR 0.2  --   --   IBILI 0.7  --   --    < > = values in this interval not displayed.   PT/INR No results for input(s): LABPROT, INR in the last 72 hours.   Scheduled Meds:  amiodarone   200 mg Oral BID   apixaban   5 mg Oral BID   atorvastatin   80 mg Oral QHS   carvedilol   6.25 mg Oral BID   docusate sodium   100 mg Oral BID   fluticasone  furoate-vilanterol  1 puff Inhalation Daily   hydrOXYzine   25 mg Oral TID   isosorbide -hydrALAZINE   1 tablet Oral TID   lanthanum   2,000 mg Oral TID WC  And   lanthanum   1,000 mg Oral BID BM   mexiletine  300 mg Oral BID   pantoprazole   40 mg Oral BID   polyethylene glycol  17 g Oral Daily   potassium chloride   20 mEq Oral Once   sodium chloride  flush  3 mL Intravenous Q12H   Continuous Infusions:    Patient profile:   63 year old male with history of CHF status post AICD, CAD, end-stage renal disease on dialysis, sleep apnea presents with epigastric discomfort shortness of breath and chest pain. CT abdomen pelvis shows large heterogeneously hypodense mass arising from pancreatic head also inseparable from descending duodenum measuring 5 x 3.8 x 4.7 cm concerning for malignancy.  Enlarged gastrohepatic ligament and celiac axis lymph nodes numerous prominent  retroperitoneal lymph nodes, hypodense hepatic lesion adjacent to the falciform ligament generally characteristic and location appearance for focal fatty deposition but could be hepatic metastasis   Impression/Plan:   Pancreatic head mass with lymph node metastatic disease and elevated LFTs concerning for primary malignancy -CA 19-9 less than 2 -Will need EUS with FNA, patient had Eliquis  this morning and was not made n.p.o. was able to go for EUS, will place orders for EUS potentially Wednesday with Dr. Wilhelmenia. -Continue renal diet, n.p.o. for potential EUS Wednesday, continue to hold Eliquis  -If patient overall is doing well can consider outpatient EUS  Elevated LFTs AST 176 down from 237, 249 a month ago 75 ALT 164 down from 182, 183 and a month ago 60 Alk phos not elevated, total bilirubin normal Fatty liver versus potential metastasis seen on CT -Continue to trend LFTs  Normocytic anemia Month ago hemoglobin 13 currently 10.7 No overt GI bleeding Potentially secondary to CKD/anemia of chronic disease -Continue to trend hemoglobin Get iron, B12, ferritin  CHF EF 30%, history of AICD Echo yesterday EF 30 to 35% grade 1 diastolic mild MR no aortic stenosis  Nonobstructive CAD with elevated troponins .  No chest pain currently, most recent troponin 47 and stable  End-stage renal disease  on dialysis 3 times weekly  Morbid obesity  Emphysema  Principal Problem:   Pancreatic mass Active Problems:   Combined congestive systolic and diastolic heart failure (HCC)   Reactive airway disease   ESRD (end stage renal disease) (HCC)   Hypotension   Hypokalemia   Elevated troponin   History of ventricular tachycardia s/p ICD   Chest pain    LOS: 2 days   Alan JONELLE Coombs  12/12/2023, 9:52 AM   Attending physician's note   I have taken a history, reviewed the chart and examined the patient. I performed a substantive portion of this encounter, including complete  performance of at least one of the key components, in conjunction with the APP. I agree with the APP's note, impression and recommendations.    63 year old male with end-stage renal disease on dialysis, sleep apnea, CHF on CPAP with pancreatic head mass extending into duodenum, enlarged lymph nodes concerning for possible malignancy Tentatively plan for EUS with Dr. Wilhelmenia on Wednesday.  Transaminases are elevated, total bili and alk phos within normal range, will continue to monitor.  Based on EUS findings may need ERCP with biliary stent Eliquis  on hold in anticipation for the procedure.  Continue heparin  gtt.    The patient was provided an opportunity to ask questions and all were answered. The patient agreed with the plan and demonstrated an understanding of the instructions.   LOIS Wilkie Mcgee , MD [phone]

## 2023-12-12 NOTE — Plan of Care (Signed)

## 2023-12-13 ENCOUNTER — Ambulatory Visit: Payer: Self-pay | Admitting: *Deleted

## 2023-12-13 ENCOUNTER — Other Ambulatory Visit: Payer: Self-pay

## 2023-12-13 ENCOUNTER — Telehealth: Payer: Self-pay

## 2023-12-13 DIAGNOSIS — K8689 Other specified diseases of pancreas: Secondary | ICD-10-CM | POA: Diagnosis not present

## 2023-12-13 LAB — COMPREHENSIVE METABOLIC PANEL
ALT: 159 U/L — ABNORMAL HIGH (ref 0–44)
AST: 168 U/L — ABNORMAL HIGH (ref 15–41)
Albumin: 2.1 g/dL — ABNORMAL LOW (ref 3.5–5.0)
Alkaline Phosphatase: 96 U/L (ref 38–126)
Anion gap: 13 (ref 5–15)
BUN: 15 mg/dL (ref 6–20)
CO2: 27 mmol/L (ref 22–32)
Calcium: 9.2 mg/dL (ref 8.9–10.3)
Chloride: 98 mmol/L (ref 98–111)
Creatinine, Ser: 9.74 mg/dL — ABNORMAL HIGH (ref 0.61–1.24)
GFR, Estimated: 6 mL/min — ABNORMAL LOW (ref >60)
Glucose, Bld: 80 mg/dL (ref 70–99)
Potassium: 3.9 mmol/L (ref 3.5–5.1)
Sodium: 138 mmol/L (ref 135–145)
Total Bilirubin: 1 mg/dL (ref 0.0–1.2)
Total Protein: 5.8 g/dL — ABNORMAL LOW (ref 6.5–8.1)

## 2023-12-13 LAB — IRON AND TIBC
Iron: 61 ug/dL (ref 45–182)
Saturation Ratios: 41 % — ABNORMAL HIGH (ref 17.9–39.5)
TIBC: 150 ug/dL — ABNORMAL LOW (ref 250–450)
UIBC: 89 ug/dL

## 2023-12-13 LAB — CBC
HCT: 31.8 % — ABNORMAL LOW (ref 39.0–52.0)
Hemoglobin: 10.9 g/dL — ABNORMAL LOW (ref 13.0–17.0)
MCH: 29.8 pg (ref 26.0–34.0)
MCHC: 34.3 g/dL (ref 30.0–36.0)
MCV: 86.9 fL (ref 80.0–100.0)
Platelets: 186 10*3/uL (ref 150–400)
RBC: 3.66 MIL/uL — ABNORMAL LOW (ref 4.22–5.81)
RDW: 17.2 % — ABNORMAL HIGH (ref 11.5–15.5)
WBC: 4.4 10*3/uL (ref 4.0–10.5)
nRBC: 0 % (ref 0.0–0.2)

## 2023-12-13 LAB — APTT
aPTT: >200 s (ref 24–36)
aPTT: 152 s — ABNORMAL HIGH (ref 24–36)

## 2023-12-13 LAB — VITAMIN B12: Vitamin B-12: 1799 pg/mL — ABNORMAL HIGH (ref 180–914)

## 2023-12-13 LAB — HEPARIN LEVEL (UNFRACTIONATED): Heparin Unfractionated: >1.1 [IU]/mL — ABNORMAL HIGH (ref 0.30–0.70)

## 2023-12-13 LAB — HEPATITIS B SURFACE ANTIBODY, QUANTITATIVE: Hep B S AB Quant (Post): 243 m[IU]/mL

## 2023-12-13 LAB — FERRITIN: Ferritin: 2103 ng/mL — ABNORMAL HIGH (ref 24–336)

## 2023-12-13 MED ORDER — NEPRO/CARBSTEADY PO LIQD: 237.0000 mL | ORAL | Status: DC | PRN

## 2023-12-13 MED ORDER — HEPARIN SODIUM (PORCINE) 1000 UNIT/ML DIALYSIS: 2000.0000 [IU] | Freq: Once | INTRAMUSCULAR | Status: DC

## 2023-12-13 MED ORDER — ALTEPLASE 2 MG IJ SOLR: 2.0000 mg | Freq: Once | INTRAMUSCULAR | Status: DC | PRN

## 2023-12-13 MED ORDER — OXYCODONE-ACETAMINOPHEN 5-325 MG PO TABS
1.0000 | ORAL_TABLET | Freq: Once | ORAL | Status: AC
Start: 2023-12-13 — End: 2023-12-13
  Administered 2023-12-13: 2 via ORAL
  Filled 2023-12-13: qty 2

## 2023-12-13 MED ORDER — PENTAFLUOROPROP-TETRAFLUOROETH EX AERO: 1.0000 | INHALATION_SPRAY | CUTANEOUS | Status: DC | PRN

## 2023-12-13 MED ORDER — HEPARIN SODIUM (PORCINE) 1000 UNIT/ML DIALYSIS: 1000.0000 [IU] | INTRAMUSCULAR | Status: DC | PRN

## 2023-12-13 MED ORDER — HEPARIN (PORCINE) 25000 UT/250ML-% IV SOLN
1000.0000 [IU]/h | INTRAVENOUS | Status: DC
  Administered 2023-12-13: 1000 [IU]/h via INTRAVENOUS
  Filled 2023-12-13: qty 250

## 2023-12-13 MED ORDER — ANTICOAGULANT SODIUM CITRATE 4% (200MG/5ML) IV SOLN
5.0000 mL | Status: DC | PRN
Start: 2023-12-13 — End: 2023-12-13

## 2023-12-13 NOTE — Plan of Care (Signed)

## 2023-12-13 NOTE — Progress Notes (Signed)
 Pt receives out-pt HD at [facility] on [address]. Will assist as needed.   Olivia Canter Renal Navigator [phone]

## 2023-12-13 NOTE — Telephone Encounter (Signed)
 Chart was reviewed. Noted that pt is being followed up on by our office. Pt is to have EUS tomorrow by Dr. Meridee Score at [facility]

## 2023-12-13 NOTE — Progress Notes (Signed)
 PROGRESS NOTE    Victor Thompson  FMW:[id number] DOB: 1961-04-12 DOA: 12/10/2023 PCP: [name], MD   Brief Narrative:   Patient is a 63 year old African-American male, with past medical history significant for ESRD on HD TTS schedule, ischemic cardiomyopathy, congestive heart failure with reduced EF (25 to 30%), history of CAD, VT storm status post ICD, currently on Eliquis  and amiodarone , reactive airway disease and hyperlipidemia.  Patient presented with epigastric pain, shortness of breath, cough and chest pain.  Further workup in the ED showed pancreatic mass concerning for malignancy.  Patient was transferred to [facility] as patient will likely need endoscopic ultrasound guided biopsy of pancreatic mass.  GI is directing care.     12/13/2023: Patient is awaiting EUS guided biopsy of the pancreatic mass anticipated for AM versus outpatient.  Currently holding Eliquis  and patient is on heparin  drip.  Hemodialysis planned for today.  Assessment & Plan:   Principal Problem:   Pancreatic mass Active Problems:   Combined congestive systolic and diastolic heart failure (HCC)   Hypotension   Hypokalemia   Elevated troponin   Chest pain   Reactive airway disease   ESRD (end stage renal disease) (HCC)   History of ventricular tachycardia s/p ICD   Abdominal pain  Assessment and Plan:  Pancreatic mass Transaminitis secondary to pancreatic mass-downtrending > Patient presented to the emergency department with multiple complaints - epigastric pain, nausea, chest pain, weight loss, poor appetite, shortness of breath and cough. -CBC showed stable H&H.  BMP showed low potassium 3 otherwise unremarkable. - Hepatic function showed elevated AST and ALT.  Normal alkaline phosphatase and bilirubin level.  Normal lipase.  Continue to monitor. -CT abdomen showed pancreatic mass with multiple lymph node concerning for pancreatic cancer. - GI planning for potential inpatient EUS 1/8  versus outpatient EUS.  Plan to keep n.p.o. after midnight -Eliquis  held and patient on heparin  drip.   Chronic chest pain Elevated troponin History of nonobstructive CAD > Patient is complaining about chest pain both at rest and on exertion which has been ongoing for last 3 to 4 months.  Denies any associated palpitation, shortness of breath, nausea and diaphoresis. - Per chart review patient has history of CAD status post PCI 2005 and Cardiolite  2017 showed large fixed defect suggestive of prior infarction and EF 20%, left heart cath 07/25/2021 no significant CAD. - Initial troponin 43.  Per chart review patient baseline troponin around 50 to 100.  Chronic elevated troponin in the setting of ESRD patient. -Plan to continue to trend troponin and obtaining echocardiogram to assess wall motion abnormality. -If echocardiogram shows wall motion abnormality in that case need to consult cardiology inpatient. -Continue Coreg  and Lipitor  -Continue cardiac monitoring. 12/12/2023: Resolved.  Echocardiogram revealed ejection fraction of 30 to 35%. -Eliquis  held for now and patient on heparin  drip for anticipated inpatient procedure.   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 - Continue Coreg  and BiDil .     History of ventricular tachycardia status post ICD - Will hold Eliquis . -Will bridge with heparin .   -Continue amiodarone , mexiletine and Coreg .   Reactive airway disease Cough and shortness of breath - Respiratory panel ruled out COVID, flu flu and RSV.  Patient is not volume overloaded had dialysis today.  Chest x-ray unremarkable.  CTA chest unremarkable. - Continue with Breo Ellipta  twice daily and Xopenex   every 6 hour as needed for any wheezing shortness of breath 12/11/2023: Resolved significantly.   ESRD on dialysis TTS  schedule -Last dialysis 12/09/2022.  Slight low potassium 3.  Not supplementing with potassium as ESRD patient.  Euvolemic on physical exam. - Plan for hemodialysis today per nephrology  Obesity, Class II -BMI 36.64  DVT prophylaxis:Heparin  drip Code Status: Full Family Communication: None at bedside Disposition Plan:  Status is: Inpatient Remains inpatient appropriate because: Need for EUS 1/8.  Hemodialysis.  Consultants:  GI Nephrology  Procedures:  None  Antimicrobials:  None   Subjective: Patient seen and evaluated today with no new acute complaints or concerns. No acute concerns or events noted overnight.  He denies any abdominal pain, nausea or vomiting.  He appears to have slept well overnight.  Objective: Vitals:   12/12/23 1950 12/13/23 0046 12/13/23 0403 12/13/23 0601  BP: (!) 142/80 135/77 139/77   Pulse: 68 66 68   Resp: 19 19 19    Temp: 98.2 F (36.8 C) 98.2 F (36.8 C) 98.4 F (36.9 C)   TempSrc: Oral Oral Oral   SpO2: 98% 96% 99%   Weight:    106.1 kg  Height:        Intake/Output Summary (Last 24 hours) at 12/13/2023 0640 Last data filed at 12/12/2023 1212 Gross per 24 hour  Intake 360 ml  Output 0 ml  Net 360 ml   Filed Weights   12/10/23 1052 12/12/23 0500 12/13/23 0601  Weight: 100.8 kg 103.8 kg 106.1 kg    Examination:  General exam: Appears calm and comfortable, obese Respiratory system: Clear to auscultation. Respiratory effort normal.  CPAP mask present Cardiovascular system: S1 & S2 heard, RRR.  Gastrointestinal system: Abdomen is soft Central nervous system: Alert and awake Extremities: No edema Skin: No significant lesions noted Psychiatry: Flat affect.    Data Reviewed: I have personally reviewed following labs and imaging studies  CBC: Recent Labs  Lab 12/10/23 1125 12/11/23 0216 12/12/23 0551 12/13/23 0548  WBC 4.3 4.9 3.9* 4.4  HGB 12.2* 11.5* 10.7* 10.9*  HCT 36.5* 34.0* 32.0* 31.8*  MCV 88.4 88.5 88.2 86.9   PLT 240 180 177 186   Basic Metabolic Panel: Recent Labs  Lab 12/10/23 1339 12/11/23 0216 12/12/23 0051 12/12/23 0551  NA 139 139  --  138  K 3.0* 3.3*  --  3.4*  CL 97* 100  --  98  CO2 32 29  --  29  GLUCOSE 90 121*  --  82  BUN 7 7  --  10  CREATININE 4.33* 5.67*  --  7.81*  CALCIUM  7.9* 8.6*  --  8.8*  PHOS  --   --  3.5  --    GFR: Estimated Creatinine Clearance: 11.7 mL/min (A) (by C-G formula based on SCr of 7.81 mg/dL (H)). Liver Function Tests: Recent Labs  Lab 12/10/23 1339 12/11/23 0216 12/12/23 0551  AST 249* 237* 176*  ALT 183* 182* 164*  ALKPHOS 83 97 88  BILITOT 0.9 0.9 1.1  PROT 5.6* 5.5* 5.7*  ALBUMIN  2.2* 2.0* 2.0*   Recent Labs  Lab 12/10/23 1339  LIPASE 40   No results for input(s): AMMONIA in the last 168 hours. Coagulation Profile: No results for input(s): INR, PROTIME in the last 168 hours. Cardiac Enzymes: No results for input(s): CKTOTAL, CKMB, CKMBINDEX, TROPONINI in the last 168 hours. BNP (last 3 results) No results for input(s): PROBNP in the last 8760 hours. HbA1C: No results for input(s): HGBA1C  in the last 72 hours. CBG: No results for input(s): GLUCAP in the last 168 hours. Lipid Profile: No results for input(s): CHOL, HDL, LDLCALC, TRIG, CHOLHDL, LDLDIRECT in the last 72 hours. Thyroid  Function Tests: No results for input(s): TSH, T4TOTAL, FREET4, T3FREE, THYROIDAB in the last 72 hours. Anemia Panel: No results for input(s): VITAMINB12, FOLATE, FERRITIN, TIBC, IRON, RETICCTPCT in the last 72 hours. Sepsis Labs: No results for input(s): PROCALCITON, LATICACIDVEN in the last 168 hours.  Recent Results (from the past 240 hours)  Resp panel by RT-PCR (RSV, Flu A&B, Covid) Anterior Nasal Swab     Status: None   Collection Time: 12/10/23 10:58 AM   Specimen: Anterior Nasal Swab  Result Value Ref Range Status   SARS Coronavirus 2 by RT PCR NEGATIVE NEGATIVE Final     Comment: (NOTE) SARS-CoV-2 target nucleic acids are NOT DETECTED.  The SARS-CoV-2 RNA is generally detectable in upper respiratory specimens during the acute phase of infection. The lowest concentration of SARS-CoV-2 viral copies this assay can detect is 138 copies/mL. A negative result does not preclude SARS-Cov-2 infection and should not be used as the sole basis for treatment or other patient management decisions. A negative result may occur with  improper specimen collection/handling, submission of specimen other than nasopharyngeal swab, presence of viral mutation(s) within the areas targeted by this assay, and inadequate number of viral copies(<138 copies/mL). A negative result must be combined with clinical observations, patient history, and epidemiological information. The expected result is Negative.  Fact Sheet for Patients:  bloggercourse.com  Fact Sheet for Healthcare Providers:  seriousbroker.it  This test is no t yet approved or cleared by the [place]  FDA and  has been authorized for detection and/or diagnosis of SARS-CoV-2 by FDA under an Emergency Use Authorization (EUA). This EUA will remain  in effect (meaning this test can be used) for the duration of the COVID-19 declaration under Section 564(b)(1) of the Act, 21 U.S.C.section 360bbb-3(b)(1), unless the authorization is terminated  or revoked sooner.       Influenza A by PCR NEGATIVE NEGATIVE Final   Influenza B by PCR NEGATIVE NEGATIVE Final    Comment: (NOTE) The Xpert Xpress SARS-CoV-2/FLU/RSV plus assay is intended as an aid in the diagnosis of influenza from Nasopharyngeal swab specimens and should not be used as a sole basis for treatment. Nasal washings and aspirates are unacceptable for Xpert Xpress SARS-CoV-2/FLU/RSV testing.  Fact Sheet for Patients: bloggercourse.com  Fact Sheet for Healthcare  Providers: seriousbroker.it  This test is not yet approved or cleared by the [place]  FDA and has been authorized for detection and/or diagnosis of SARS-CoV-2 by FDA under an Emergency Use Authorization (EUA). This EUA will remain in effect (meaning this test can be used) for the duration of the COVID-19 declaration under Section 564(b)(1) of the Act, 21 U.S.C. section 360bbb-3(b)(1), unless the authorization is terminated or revoked.     Resp Syncytial Virus by PCR NEGATIVE NEGATIVE Final    Comment: (NOTE) Fact Sheet for Patients: bloggercourse.com  Fact Sheet for Healthcare Providers: seriousbroker.it  This test is not yet approved or cleared by the [place]  FDA and has been authorized for detection and/or diagnosis of SARS-CoV-2 by FDA under an Emergency Use Authorization (EUA). This EUA will remain in effect (meaning this test can be used) for the duration of the COVID-19 declaration under Section 564(b)(1) of the Act, 21 U.S.C. section 360bbb-3(b)(1), unless the authorization is terminated or revoked.  Performed at [facility],  [address] [place], [place] [address]          Radiology Studies: ECHOCARDIOGRAM COMPLETE Result Date: 12/11/2023    ECHOCARDIOGRAM REPORT   Patient Name:   Victor Thompson Date of Exam: 12/11/2023 Medical Rec #:  [id number]      Height:       67.0 in Accession #:    [id number]     Weight:       222.2 lb Date of Birth:  1960-12-25     BSA:          2.115 m Patient Age:    62 years       BP:           106/69 mmHg Patient Gender: M              HR:           65 bpm. Exam Location:  Inpatient Procedure: 2D Echo, Cardiac Doppler and Color Doppler Indications:    Elevated Troponin  History:        Patient has prior history of Echocardiogram examinations, most                 recent 12/01/2023. Cardiomyopathy and CHF, CAD, COPD and ESRD,                  Arrythmias:Atrial Fibrillation and Tachycardia,                 Signs/Symptoms:Hypotension and Chest Pain; Risk                 Factors:Hypertension, Dyslipidemia, Sleep Apnea and Current                 Smoker.  Sonographer:    Thea Norlander RCS Referring Phys: SUBRINA SUNDIL IMPRESSIONS  1. Left ventricular ejection fraction, by estimation, is 30 to 35%. The left ventricle has moderately decreased function. The left ventricle demonstrates global hypokinesis. The left ventricular internal cavity size was moderately dilated. Left ventricular diastolic parameters are consistent with Grade I diastolic dysfunction (impaired relaxation).  2. Right ventricular systolic function is normal. The right ventricular size is normal. Tricuspid regurgitation signal is inadequate for assessing PA pressure.  3. The mitral valve is abnormal. Mild mitral valve regurgitation. No evidence of mitral stenosis.  4. The tricuspid valve is abnormal.  5. The aortic valve is tricuspid. Aortic valve regurgitation is not visualized. No aortic stenosis is present.  6. Aortic dilatation noted. There is mild dilatation of the ascending aorta, measuring 36 mm.  7. The inferior vena cava is normal in size with greater than 50% respiratory variability, suggesting right atrial pressure of 3 mmHg. FINDINGS  Left Ventricle: Left ventricular ejection fraction, by estimation, is 30 to 35%. The left ventricle has moderately decreased function. The left ventricle demonstrates global hypokinesis. The left ventricular internal cavity size was moderately dilated. There is no left ventricular hypertrophy. Left ventricular diastolic parameters are consistent with Grade I diastolic dysfunction (impaired relaxation). Normal left ventricular filling pressure. Right Ventricle: The right ventricular size is normal. Right vetricular wall thickness was not well visualized. Right ventricular systolic function is normal. Tricuspid regurgitation signal is inadequate  for assessing PA pressure. Left Atrium: Left atrial size was normal in size. Right Atrium: Right atrial size was normal in size. Pericardium: There is no evidence of pericardial effusion. Mitral Valve: The mitral valve is abnormal. Mild mitral valve regurgitation. No evidence of mitral valve stenosis. Tricuspid Valve: The tricuspid valve  is abnormal. Tricuspid valve regurgitation is mild . No evidence of tricuspid stenosis. Aortic Valve: The aortic valve is tricuspid. Aortic valve regurgitation is not visualized. No aortic stenosis is present. Aortic valve mean gradient measures 4.8 mmHg. Aortic valve peak gradient measures 9.1 mmHg. Aortic valve area, by VTI measures 2.38 cm. Pulmonic Valve: The pulmonic valve was not well visualized. Pulmonic valve regurgitation is not visualized. No evidence of pulmonic stenosis. Aorta: The aortic root is normal in size and structure and aortic dilatation noted. There is mild dilatation of the ascending aorta, measuring 36 mm. Venous: The inferior vena cava is normal in size with greater than 50% respiratory variability, suggesting right atrial pressure of 3 mmHg. IAS/Shunts: No atrial level shunt detected by color flow Doppler.  LEFT VENTRICLE PLAX 2D LVIDd:         6.50 cm      Diastology LVIDs:         5.40 cm      LV e' medial:    5.00 cm/s LV PW:         0.80 cm      LV E/e' medial:  10.7 LV IVS:        0.60 cm      LV e' lateral:   7.94 cm/s LVOT diam:     2.20 cm      LV E/e' lateral: 6.7 LV SV:         67 LV SV Index:   32 LVOT Area:     3.80 cm  LV Volumes (MOD) LV vol d, MOD A2C: 246.0 ml LV vol d, MOD A4C: 186.0 ml LV vol s, MOD A2C: 147.0 ml LV vol s, MOD A4C: 114.0 ml LV SV MOD A2C:     99.0 ml LV SV MOD A4C:     186.0 ml LV SV MOD BP:      94.5 ml RIGHT VENTRICLE             IVC RV S prime:     15.00 cm/s  IVC diam: 1.10 cm TAPSE (M-mode): 2.5 cm LEFT ATRIUM           Index        RIGHT ATRIUM           Index LA diam:      4.40 cm 2.08 cm/m   RA Area:     19.70  cm LA Vol (A2C): 47.7 ml 22.55 ml/m  RA Volume:   59.40 ml  28.09 ml/m LA Vol (A4C): 52.8 ml 24.97 ml/m  AORTIC VALVE AV Area (Vmax):    2.45 cm AV Area (Vmean):   2.33 cm AV Area (VTI):     2.38 cm AV Vmax:           150.49 cm/s AV Vmean:          101.273 cm/s AV VTI:            0.282 m AV Peak Grad:      9.1 mmHg AV Mean Grad:      4.8 mmHg LVOT Vmax:         96.93 cm/s LVOT Vmean:        62.000 cm/s LVOT VTI:          0.176 m LVOT/AV VTI ratio: 0.63  AORTA Ao Root diam: 3.20 cm Ao Asc diam:  3.60 cm MITRAL VALVE MV Area (PHT): 4.31 cm    SHUNTS MV Decel Time: 176 msec    Systemic  VTI:  0.18 m MR Peak grad: 87.6 mmHg    Systemic Diam: 2.20 cm MR Vmax:      468.00 cm/s MV E velocity: 53.50 cm/s MV A velocity: 85.90 cm/s MV E/A ratio:  0.62 Dorn Ross MD Electronically signed by Dorn Ross MD Signature Date/Time: 12/11/2023/1:31:06 PM    Final         Scheduled Meds:  amiodarone   200 mg Oral BID   atorvastatin   80 mg Oral QHS   carvedilol   6.25 mg Oral BID   Chlorhexidine  Gluconate Cloth  6 each Topical Q0600   docusate sodium   100 mg Oral BID   fluticasone  furoate-vilanterol  1 puff Inhalation Daily   hydrOXYzine   25 mg Oral TID   isosorbide -hydrALAZINE   1 tablet Oral TID   lanthanum   2,000 mg Oral TID WC   And   lanthanum   1,000 mg Oral BID BM   mexiletine  300 mg Oral BID   pantoprazole   40 mg Oral BID   polyethylene glycol  17 g Oral Daily   sodium chloride  flush  3 mL Intravenous Q12H   Continuous Infusions:  heparin  1,200 Units/hr (12/12/23 2259)     LOS: 3 days    Time spent: 55 minutes    Jeanice Dempsey JONETTA Fairly, DO [facility]  If 7PM-7AM, please contact night-coverage [email or web address] 12/13/2023, 6:40 AM

## 2023-12-13 NOTE — Progress Notes (Signed)
 [facility] Progress Note  Subjective: seen in room, no /co  Vitals:   12/13/23 1430 12/13/23 1500 12/13/23 1530 12/13/23 1600  BP: (!) 142/84 (!) 145/85 125/71 98/67  Pulse: 61 62 67 70  Resp: 18 18 19  (!) 22  Temp:      TempSrc:      SpO2: 99% 100% 100% 100%  Weight:      Height:        Exam: Gen alert, no distress No jvd or bruits Chest clear bilat to bases, no rales/ wheezing RRR no MRG Abd soft ntnd no mass or ascites +bs GU defer MS no joint effusions or deformity Ext no LE or UE edema, no wounds or ulcers Neuro is alert, Ox 3 , nf    AVF+bruit         Renal-related home meds: - eliquis  5 bid - coreg  6.25 bid, bidil  20-37.5 bid - fosrenol  2 gm ac tid - others: mexiletine 300 bid, PPI, symbicort , statin, amiodarone       [facility] HD: [facility]   4h   450 /1.5  102kg  2/2 bath   AVF   Heparin  3000 - hect 9 micrograms three times per week - no esa   Assessment/ Plan: Pancreatic mass - w/ abd pain, CT +for mass a head of pancreas, also multiple local enlarged LN's. GI consulting, plan for procedure Wednesday.  ESKD - on HD TTS. HD today.  HTN - BP's are wnl, cont home meds Volume - no edema on exam but pt thinks he has fluid on. Max UF w/ HD tomorrow.  Anemia of eskd - Hb 11, not on esa at OP unit. Follow.  Secondary hyperparathyroidism - CCa in range. Cont binders w/ meals.  H/o combined heart failure H/o VT storm - sp ICD and takes mexilitene, BB and amiodarone      Myer Fret MD  [facility] 12/13/2023, 4:47 PM  Recent Labs  Lab 12/12/23 0051 12/12/23 0551 12/13/23 0548  HGB  --  10.7* 10.9*  ALBUMIN   --  2.0* 2.1*  CALCIUM   --  8.8* 9.2  PHOS 3.5  --   --   CREATININE  --  7.81* 9.74*  K  --  3.4* 3.9   Recent Labs  Lab 12/13/23 0816  IRON 61  TIBC 150*  FERRITIN 2,103*   Inpatient medications:  amiodarone   200 mg Oral BID   atorvastatin   80 mg Oral QHS   carvedilol   6.25 mg Oral BID   Chlorhexidine  Gluconate Cloth  6 each  Topical Q0600   docusate sodium   100 mg Oral BID   fluticasone  furoate-vilanterol  1 puff Inhalation Daily   [START ON 12/14/2023] heparin   2,000 Units Dialysis Once in dialysis   hydrOXYzine   25 mg Oral TID   isosorbide -hydrALAZINE   1 tablet Oral TID   lanthanum   2,000 mg Oral TID WC   And   lanthanum   1,000 mg Oral BID BM   mexiletine  300 mg Oral BID   pantoprazole   40 mg Oral BID   polyethylene glycol  17 g Oral Daily   sodium chloride  flush  3 mL Intravenous Q12H    anticoagulant sodium citrate      heparin  1,000 Units/hr (12/13/23 1115)   acetaminophen  **OR** acetaminophen , alteplase , anticoagulant sodium citrate , feeding supplement (NEPRO CARB STEADY), heparin , [START ON 12/14/2023] heparin , levalbuterol , ondansetron  **OR** ondansetron  (ZOFRAN ) IV, pentafluoroprop-tetrafluoroeth, sodium chloride  flush

## 2023-12-13 NOTE — Plan of Care (Signed)
  Problem: Education: Goal: Knowledge of General Education information will improve Description: Including pain rating scale, medication(s)/side effects and non-pharmacologic comfort measures 12/13/2023 0006 by Clair Candelaria RAMAN, RN Outcome: Progressing 12/13/2023 0006 by Clair Candelaria RAMAN, RN Outcome: Progressing   Problem: Health Behavior/Discharge Planning: Goal: Ability to manage health-related needs will improve 12/13/2023 0006 by Clair Candelaria RAMAN, RN Outcome: Progressing 12/13/2023 0006 by Clair Candelaria RAMAN, RN Outcome: Progressing   Problem: Clinical Measurements: Goal: Ability to maintain clinical measurements within normal limits will improve 12/13/2023 0006 by Clair Candelaria RAMAN, RN Outcome: Progressing 12/13/2023 0006 by Clair Candelaria RAMAN, RN Outcome: Progressing Goal: Will remain free from infection 12/13/2023 0006 by Clair Candelaria RAMAN, RN Outcome: Progressing 12/13/2023 0006 by Clair Candelaria RAMAN, RN Outcome: Progressing Goal: Diagnostic test results will improve 12/13/2023 0006 by Clair Candelaria RAMAN, RN Outcome: Progressing 12/13/2023 0006 by Clair Candelaria RAMAN, RN Outcome: Progressing Goal: Respiratory complications will improve 12/13/2023 0006 by Clair Candelaria RAMAN, RN Outcome: Progressing 12/13/2023 0006 by Clair Candelaria RAMAN, RN Outcome: Progressing Goal: Cardiovascular complication will be avoided 12/13/2023 0006 by Clair Candelaria RAMAN, RN Outcome: Progressing 12/13/2023 0006 by Clair Candelaria RAMAN, RN Outcome: Progressing   Problem: Activity: Goal: Risk for activity intolerance will decrease 12/13/2023 0006 by Clair Candelaria RAMAN, RN Outcome: Progressing 12/13/2023 0006 by Clair Candelaria RAMAN, RN Outcome: Progressing   Problem: Nutrition: Goal: Adequate nutrition will be maintained 12/13/2023 0006 by Clair Candelaria RAMAN, RN Outcome: Progressing 12/13/2023 0006 by Clair Candelaria RAMAN, RN Outcome: Progressing   Problem: Coping: Goal: Level of anxiety will decrease 12/13/2023  0006 by Clair Candelaria RAMAN, RN Outcome: Progressing 12/13/2023 0006 by Clair Candelaria RAMAN, RN Outcome: Progressing   Problem: Elimination: Goal: Will not experience complications related to bowel motility 12/13/2023 0006 by Clair Candelaria RAMAN, RN Outcome: Progressing 12/13/2023 0006 by Clair Candelaria RAMAN, RN Outcome: Progressing Goal: Will not experience complications related to urinary retention 12/13/2023 0006 by Clair Candelaria RAMAN, RN Outcome: Progressing 12/13/2023 0006 by Clair Candelaria RAMAN, RN Outcome: Progressing   Problem: Pain Management: Goal: General experience of comfort will improve 12/13/2023 0006 by Clair Candelaria RAMAN, RN Outcome: Progressing 12/13/2023 0006 by Clair Candelaria RAMAN, RN Outcome: Progressing   Problem: Safety: Goal: Ability to remain free from injury will improve 12/13/2023 0006 by Clair Candelaria RAMAN, RN Outcome: Progressing 12/13/2023 0006 by Clair Candelaria RAMAN, RN Outcome: Progressing   Problem: Skin Integrity: Goal: Risk for impaired skin integrity will decrease 12/13/2023 0006 by Clair Candelaria RAMAN, RN Outcome: Progressing 12/13/2023 0006 by Clair Candelaria RAMAN, RN Outcome: Progressing

## 2023-12-13 NOTE — Care Management Important Message (Signed)
 Important Message  Patient Details  Name: Victor Thompson MRN: [id number] Date of Birth: 02-27-1961   Important Message Given:  Yes - Medicare IM     Dorena Bodo 12/13/2023, 2:17 PM

## 2023-12-13 NOTE — Progress Notes (Addendum)
 Progress Note  Primary GI: Dr. Leigh DOA: 12/10/2023         Hospital Day: 4   Subjective  Chief Complaint: Epigastric pain, shortness of breath, chest pain  No family was present at the time of my evaluation. Still with AB fullness and epigastric pain.  No fever, chills, nausea, vomiting.    Objective   Vital signs in last 24 hours: Temp:  [97.7 F (36.5 C)-98.4 F (36.9 C)] 98.4 F (36.9 C) (01/07 0403) Pulse Rate:  [63-69] 69 (01/07 0814) Resp:  [18-19] 18 (01/07 0814) BP: (109-142)/(69-83) 109/83 (01/07 0813) SpO2:  [96 %-99 %] 98 % (01/07 0814) FiO2 (%):  [21 %] 21 % (01/07 0814) Weight:  [106.1 kg] 106.1 kg (01/07 0601) Last BM Date : 12/09/23 Last BM recorded by nurses in past 5 days No data recorded  General:   male in no acute distress  Heart:  Regular rate and rhythm; no murmurs Pulm: Clear anteriorly; no wheezing Abdomen:  Soft, Obese AB, Sluggish bowel sounds. moderate tenderness in the epigastrium and in the RUQ. Without guarding and Without rebound, No organomegaly appreciated. Extremities:  without  edema. Neurologic:  Alert and  oriented x4;  No focal deficits.  Psych:  Cooperative. Normal mood and affect.  Intake/Output from previous day: 01/06 0701 - 01/07 0700 In: 360 [P.O.:360] Out: 0  Intake/Output this shift: No intake/output data recorded.  Studies/Results: ECHOCARDIOGRAM COMPLETE Result Date: 12/11/2023    ECHOCARDIOGRAM REPORT   Patient Name:   ARLAND USERY Date of Exam: 12/11/2023 Medical Rec #:  [id number]      Height:       67.0 in Accession #:    [id number]     Weight:       222.2 lb Date of Birth:  July 12, 1961     BSA:          2.115 m Patient Age:    62 years       BP:           106/69 mmHg Patient Gender: M              HR:           65 bpm. Exam Location:  Inpatient Procedure: 2D Echo, Cardiac Doppler and Color Doppler Indications:    Elevated Troponin  History:        Patient has prior history of Echocardiogram examinations, most                  recent 12/01/2023. Cardiomyopathy and CHF, CAD, COPD and ESRD,                 Arrythmias:Atrial Fibrillation and Tachycardia,                 Signs/Symptoms:Hypotension and Chest Pain; Risk                 Factors:Hypertension, Dyslipidemia, Sleep Apnea and Current                 Smoker.  Sonographer:    Thea Norlander RCS Referring Phys: SUBRINA SUNDIL IMPRESSIONS  1. Left ventricular ejection fraction, by estimation, is 30 to 35%. The left ventricle has moderately decreased function. The left ventricle demonstrates global hypokinesis. The left ventricular internal cavity size was moderately dilated. Left ventricular diastolic parameters are consistent with Grade I diastolic dysfunction (impaired relaxation).  2. Right ventricular systolic function is normal. The right ventricular size is normal. Tricuspid regurgitation signal is  inadequate for assessing PA pressure.  3. The mitral valve is abnormal. Mild mitral valve regurgitation. No evidence of mitral stenosis.  4. The tricuspid valve is abnormal.  5. The aortic valve is tricuspid. Aortic valve regurgitation is not visualized. No aortic stenosis is present.  6. Aortic dilatation noted. There is mild dilatation of the ascending aorta, measuring 36 mm.  7. The inferior vena cava is normal in size with greater than 50% respiratory variability, suggesting right atrial pressure of 3 mmHg. FINDINGS  Left Ventricle: Left ventricular ejection fraction, by estimation, is 30 to 35%. The left ventricle has moderately decreased function. The left ventricle demonstrates global hypokinesis. The left ventricular internal cavity size was moderately dilated. There is no left ventricular hypertrophy. Left ventricular diastolic parameters are consistent with Grade I diastolic dysfunction (impaired relaxation). Normal left ventricular filling pressure. Right Ventricle: The right ventricular size is normal. Right vetricular wall thickness was not well visualized.  Right ventricular systolic function is normal. Tricuspid regurgitation signal is inadequate for assessing PA pressure. Left Atrium: Left atrial size was normal in size. Right Atrium: Right atrial size was normal in size. Pericardium: There is no evidence of pericardial effusion. Mitral Valve: The mitral valve is abnormal. Mild mitral valve regurgitation. No evidence of mitral valve stenosis. Tricuspid Valve: The tricuspid valve is abnormal. Tricuspid valve regurgitation is mild . No evidence of tricuspid stenosis. Aortic Valve: The aortic valve is tricuspid. Aortic valve regurgitation is not visualized. No aortic stenosis is present. Aortic valve mean gradient measures 4.8 mmHg. Aortic valve peak gradient measures 9.1 mmHg. Aortic valve area, by VTI measures 2.38 cm. Pulmonic Valve: The pulmonic valve was not well visualized. Pulmonic valve regurgitation is not visualized. No evidence of pulmonic stenosis. Aorta: The aortic root is normal in size and structure and aortic dilatation noted. There is mild dilatation of the ascending aorta, measuring 36 mm. Venous: The inferior vena cava is normal in size with greater than 50% respiratory variability, suggesting right atrial pressure of 3 mmHg. IAS/Shunts: No atrial level shunt detected by color flow Doppler.  LEFT VENTRICLE PLAX 2D LVIDd:         6.50 cm      Diastology LVIDs:         5.40 cm      LV e' medial:    5.00 cm/s LV PW:         0.80 cm      LV E/e' medial:  10.7 LV IVS:        0.60 cm      LV e' lateral:   7.94 cm/s LVOT diam:     2.20 cm      LV E/e' lateral: 6.7 LV SV:         67 LV SV Index:   32 LVOT Area:     3.80 cm  LV Volumes (MOD) LV vol d, MOD A2C: 246.0 ml LV vol d, MOD A4C: 186.0 ml LV vol s, MOD A2C: 147.0 ml LV vol s, MOD A4C: 114.0 ml LV SV MOD A2C:     99.0 ml LV SV MOD A4C:     186.0 ml LV SV MOD BP:      94.5 ml RIGHT VENTRICLE             IVC RV S prime:     15.00 cm/s  IVC diam: 1.10 cm TAPSE (M-mode): 2.5 cm LEFT ATRIUM           Index  RIGHT ATRIUM           Index LA diam:      4.40 cm 2.08 cm/m   RA Area:     19.70 cm LA Vol (A2C): 47.7 ml 22.55 ml/m  RA Volume:   59.40 ml  28.09 ml/m LA Vol (A4C): 52.8 ml 24.97 ml/m  AORTIC VALVE AV Area (Vmax):    2.45 cm AV Area (Vmean):   2.33 cm AV Area (VTI):     2.38 cm AV Vmax:           150.49 cm/s AV Vmean:          101.273 cm/s AV VTI:            0.282 m AV Peak Grad:      9.1 mmHg AV Mean Grad:      4.8 mmHg LVOT Vmax:         96.93 cm/s LVOT Vmean:        62.000 cm/s LVOT VTI:          0.176 m LVOT/AV VTI ratio: 0.63  AORTA Ao Root diam: 3.20 cm Ao Asc diam:  3.60 cm MITRAL VALVE MV Area (PHT): 4.31 cm    SHUNTS MV Decel Time: 176 msec    Systemic VTI:  0.18 m MR Peak grad: 87.6 mmHg    Systemic Diam: 2.20 cm MR Vmax:      468.00 cm/s MV E velocity: 53.50 cm/s MV A velocity: 85.90 cm/s MV E/A ratio:  0.62 Dorn Ross MD Electronically signed by Dorn Ross MD Signature Date/Time: 12/11/2023/1:31:06 PM    Final     Lab Results: Recent Labs    12/11/23 0216 12/12/23 0551 12/13/23 0548  WBC 4.9 3.9* 4.4  HGB 11.5* 10.7* 10.9*  HCT 34.0* 32.0* 31.8*  PLT 180 177 186   BMET Recent Labs    12/11/23 0216 12/12/23 0551 12/13/23 0548  NA 139 138 138  K 3.3* 3.4* 3.9  CL 100 98 98  CO2 29 29 27   GLUCOSE 121* 82 80  BUN 7 10 15   CREATININE 5.67* 7.81* 9.74*  CALCIUM  8.6* 8.8* 9.2   LFT Recent Labs    12/10/23 1339 12/11/23 0216 12/13/23 0548  PROT 5.6*   < > 5.8*  ALBUMIN  2.2*   < > 2.1*  AST 249*   < > 168*  ALT 183*   < > 159*  ALKPHOS 83   < > 96  BILITOT 0.9   < > 1.0  BILIDIR 0.2  --   --   IBILI 0.7  --   --    < > = values in this interval not displayed.   PT/INR No results for input(s): LABPROT, INR in the last 72 hours.   Scheduled Meds:  amiodarone   200 mg Oral BID   atorvastatin   80 mg Oral QHS   carvedilol   6.25 mg Oral BID   Chlorhexidine  Gluconate Cloth  6 each Topical Q0600   docusate sodium   100 mg Oral BID    fluticasone  furoate-vilanterol  1 puff Inhalation Daily   [START ON 12/14/2023] heparin   2,000 Units Dialysis Once in dialysis   hydrOXYzine   25 mg Oral TID   isosorbide -hydrALAZINE   1 tablet Oral TID   lanthanum   2,000 mg Oral TID WC   And   lanthanum   1,000 mg Oral BID BM   mexiletine  300 mg Oral BID   pantoprazole   40 mg Oral BID   polyethylene glycol  17 g Oral Daily   sodium chloride  flush  3 mL Intravenous Q12H   Continuous Infusions:  anticoagulant sodium citrate      heparin         Patient profile:   63 year old male with history of CHF status post AICD, CAD, end-stage renal disease on dialysis, sleep apnea presents with epigastric discomfort shortness of breath and chest pain. CT abdomen pelvis shows large heterogeneously hypodense mass arising from pancreatic head also inseparable from descending duodenum measuring 5 x 3.8 x 4.7 cm concerning for malignancy.  Enlarged gastrohepatic ligament and celiac axis lymph nodes numerous prominent retroperitoneal lymph nodes, hypodense hepatic lesion adjacent to the falciform ligament generally characteristic and location appearance for focal fatty deposition but could be hepatic metastasis   Impression/Plan:   Pancreatic head mass with lymph node metastatic disease and elevated LFTs concerning for primary malignancy -CA 19-9 less than 2 -Continue renal diet, n.p.o. tomorrow morning. Plan for EUS Wednesday with Dr. Wilhelmenia FNA.  Discussed risk of infection, bleeding, pancreatitis with patient, agrees to proceed. -Patient was started on heparin  as Eliquis  was held, will need to hold heparin  6 hours prior. -Continue small frequent meals  Elevated LFTs    Latest Ref Rng & Units 12/13/2023    5:48 AM 12/12/2023    5:51 AM 12/11/2023    2:16 AM  Hepatic Function  Total Protein 6.5 - 8.1 g/dL 5.8  5.7  5.5   Albumin  3.5 - 5.0 g/dL 2.1  2.0  2.0   AST 15 - 41 U/L 168  176  237   ALT 0 - 44 U/L 159  164  182   Alk Phosphatase 38 - 126  U/L 96  88  97   Total Bilirubin 0.0 - 1.2 mg/dL 1.0  1.1  0.9    AST 831 (176)  ALT 159 (164)   Alkphos 96 TBili 1.0 Alk phos not elevated, total bilirubin normal, stable at this time Fatty liver versus potential metastasis seen on CT - no signs of obstruction at this time, monitor LFTs for obstruction, may need ERCP if become elevated  Normocytic anemia 12/13/2023  HGB 10.9 MCV 86.9 Platelets 186 Month ago hemoglobin 13 currently 10.9 No overt GI bleeding Potentially secondary to CKD/anemia of chronic disease -Continue to trend hemoglobin Get iron, B12, ferritin  CHF EF 30%, history of AICD Echo yesterday EF 30 to 35% grade 1 diastolic mild MR no aortic stenosis  Nonobstructive CAD with elevated troponins .No chest pain currently, most recent troponin 47 and stable  End-stage renal disease  on dialysis 3 times weekly  Morbid obesity  Emphysema  Principal Problem:   Pancreatic mass Active Problems:   Combined congestive systolic and diastolic heart failure (HCC)   Reactive airway disease   ESRD (end stage renal disease) (HCC)   Hypotension   Hypokalemia   Elevated troponin   History of ventricular tachycardia s/p ICD   Chest pain   Abdominal pain    LOS: 3 days   Alan JONELLE Coombs  12/13/2023, 10:28 AM

## 2023-12-13 NOTE — Telephone Encounter (Signed)
-----   Message from Nurse Josette DEL sent at 12/09/2023  4:07 PM EST ----- Regarding: Abdominal pain, N/V/D, weight loss I spoke with Victor Thompson today. He has diffuse lower abdominal pain and nausea along with some vomiting and diarrhea and approximate weight loss of 30 lbs over the last month. He is awaiting a call from PCP Re: CT Results. I have sent them a message as well. He is taking zofran  as prescribed for nausea and it helps some but does not completely resolve the nausea. His appetite remains depressed and often has to force himself to eat. He is able to tolerate liquids. He had a colonoscopy in Oct and has a follow-up with Dr Leigh on 02/22/24 but I wasn't sure if your office was aware of these current symptoms.   Thank you,  Josette Pellet, RN, BSN Care Manager [facility]  Population Health  Direct Dial: [phone] Main #: [phone]

## 2023-12-13 NOTE — Progress Notes (Signed)
 Received patient in bed to unit.  Alert and oriented.  Informed consent signed and in chart.   TX duration:3.5  Patient tolerated well.  Transported back to the room  Alert, without acute distress.  Hand-off given to patient's nurse.   Access used: right AVF Access issues: none  Total UF removed: 3.5L Medication(s) given: percocet   12/13/23 1706  Vitals  Temp 97.7 F (36.5 C)  Temp Source Oral  BP 101/73  MAP (mmHg) 82  BP Location Left Wrist  BP Method Automatic  Patient Position (if appropriate) Lying  Pulse Rate 65  Pulse Rate Source Monitor  ECG Heart Rate 64  Resp 17  Oxygen  Therapy  SpO2 100 %  O2 Device Room Air  During Treatment Monitoring  Blood Flow Rate (mL/min) 399 mL/min  Arterial Pressure (mmHg) -149.69 mmHg  Venous Pressure (mmHg) 278.77 mmHg  TMP (mmHg) -0.2 mmHg  Ultrafiltration Rate (mL/min) 944 mL/min  Dialysate Flow Rate (mL/min) 299 ml/min  Duration of HD Treatment -hour(s) 3.46 hour(s)  Cumulative Fluid Removed (mL) per Treatment  6539.77  HD Safety Checks Performed Yes  Intra-Hemodialysis Comments Tx completed;Tolerated well  Dialysis Fluid Bolus Normal Saline  Bolus Amount (mL) 300 mL      Victor Thompson [facility]

## 2023-12-13 NOTE — Patient Outreach (Signed)
  Care Coordination   Documentation Note   12/13/2023 Name: Victor Thompson MRN: [id number] DOB: [birth year]  Victor Thompson is a 63 y.o. year old male who sees [name], MD for primary care. I  reached out to [facility] and assigned RN Care Manager regarding inpatient status at [facility]. Patient was scheduled for a follow-up call but upon chart review realized that he has been inpatient since 12/10/23.  What matters to the patients health and wellness today?  Did not speak with patient today.   SDOH assessments and interventions completed:  No   Care Coordination Interventions:  Yes, provided  Interventions Today    Flowsheet Row Most Recent Value  Chronic Disease   Chronic disease during today's visit Other  [Pancreatic mass visualized on CT scan concerning for malignancy]  General Interventions   General Interventions Discussed/Reviewed General Interventions Reviewed, Communication with  [reviewed inpatient notes, imaging reports]  Communication with RN  [Staff message to [facility], Olam Ku, RN & Richerd Fish, RN requesting update on inpatient status changes. Copied assigned Care Manager, Angel Little, RN.]       Follow up plan:  Will wait for update on discharge plans before following up with patient. Turning back over to assigned RN Care Manager, Clayborne Little.     Encounter Outcome:  Patient Visit Completed   Josette Pellet, RN, BSN Care Manager [facility]  Population Health  Direct Dial: [phone] Main #: [phone]

## 2023-12-13 NOTE — Progress Notes (Signed)
 PHARMACY - ANTICOAGULATION CONSULT NOTE  Pharmacy Consult for Heparin  (Eliquis  on hold) Indication: atrial fibrillation  Allergies  Allergen Reactions   Ace Inhibitors Swelling    Swelling of the tongue   Influenza Vaccines Hives and Swelling    SWELLING REACTION UNSPECIFIED    Ketorolac Swelling    SWELLING REACTION UNSPECIFIED    Lidocaine  Swelling    TONGUE SWELLS   Lisinopril Swelling    TONGUE SWELLING Pt reported problem with a BP med which sounded like  lisinopril  But as of 09/19/06,pt had tolerated altace without problem   Penicillins Swelling    TONGUE SWELLS Has patient had a PCN reaction causing immediate rash, facial/tongue/throat swelling, SOB or lightheadedness with hypotension: Yes Has patient had a PCN reaction causing severe rash involving mucus membranes or skin necrosis: No Has patient had a PCN reaction that required hospitalization No Has patient had a PCN reaction occurring within the last 10 years: Yes If all of the above answers are NO, then may proceed with Cephalosporin use.     Patient Measurements: Height: 5' 7 (170.2 cm) Weight: 106.1 kg (233 lb 14.5 oz) IBW/kg (Calculated) : 66.1 Heparin  Dosing Weight: 89 kg  Vital Signs: Temp: 98.4 F (36.9 C) (01/07 0403) Temp Source: Oral (01/07 0403) BP: 109/83 (01/07 0813) Pulse Rate: 69 (01/07 0814)  Labs: Recent Labs    12/10/23 2329 12/11/23 0216 12/11/23 0400 12/12/23 0551 12/13/23 0548 12/13/23 0816  HGB  --  11.5*  --  10.7* 10.9*  --   HCT  --  34.0*  --  32.0* 31.8*  --   PLT  --  180  --  177 186  --   APTT  --   --   --   --   --  >200*  HEPARINUNFRC  --   --   --   --   --  >1.10*  CREATININE  --  5.67*  --  7.81* 9.74*  --   TROPONINIHS 50* 47* 47*  --   --   --     Estimated Creatinine Clearance: 9.4 mL/min (A) (by C-G formula based on SCr of 9.74 mg/dL (H)).  Assessment: 63 yr old male on Eliquis  5 mg BID prior to admission for hx atrial fibrillation.  Eliquis  now held  for EUS with FN biopsy of pancreatic head mass, possibly on 1/8 per GI. Pharmacy consulted to begin IV heparin  bridge.    PTT > 200 seconds  Goal of Therapy:  Heparin  level 0.3-0.7 units/ml aPTT 66-102 seconds Monitor platelets by anticoagulation protocol: Yes   Plan:  Hold heparin  x 1 hour then decrease rate to 1000 units / hr aPTT and heparin  level ~8 hours after drip starts. Daily aPTT and heparin  level until correlating; daily CBC.  Thank you Olam Monte, PharmD  12/13/2023,9:57 AM

## 2023-12-13 NOTE — H&P (View-Only) (Signed)
 Progress Note  Primary GI: Dr. Leigh DOA: 12/10/2023         Hospital Day: 4   Subjective  Chief Complaint: Epigastric pain, shortness of breath, chest pain  No family was present at the time of my evaluation. Still with AB fullness and epigastric pain.  No fever, chills, nausea, vomiting.    Objective   Vital signs in last 24 hours: Temp:  [97.7 F (36.5 C)-98.4 F (36.9 C)] 98.4 F (36.9 C) (01/07 0403) Pulse Rate:  [63-69] 69 (01/07 0814) Resp:  [18-19] 18 (01/07 0814) BP: (109-142)/(69-83) 109/83 (01/07 0813) SpO2:  [96 %-99 %] 98 % (01/07 0814) FiO2 (%):  [21 %] 21 % (01/07 0814) Weight:  [106.1 kg] 106.1 kg (01/07 0601) Last BM Date : 12/09/23 Last BM recorded by nurses in past 5 days No data recorded  General:   male in no acute distress  Heart:  Regular rate and rhythm; no murmurs Pulm: Clear anteriorly; no wheezing Abdomen:  Soft, Obese AB, Sluggish bowel sounds. moderate tenderness in the epigastrium and in the RUQ. Without guarding and Without rebound, No organomegaly appreciated. Extremities:  without  edema. Neurologic:  Alert and  oriented x4;  No focal deficits.  Psych:  Cooperative. Normal mood and affect.  Intake/Output from previous day: 01/06 0701 - 01/07 0700 In: 360 [P.O.:360] Out: 0  Intake/Output this shift: No intake/output data recorded.  Studies/Results: ECHOCARDIOGRAM COMPLETE Result Date: 12/11/2023    ECHOCARDIOGRAM REPORT   Patient Name:   Victor Thompson Date of Exam: 12/11/2023 Medical Rec #:  [id number]      Height:       67.0 in Accession #:    [id number]     Weight:       222.2 lb Date of Birth:  July 12, 1961     BSA:          2.115 m Patient Age:    62 years       BP:           106/69 mmHg Patient Gender: M              HR:           65 bpm. Exam Location:  Inpatient Procedure: 2D Echo, Cardiac Doppler and Color Doppler Indications:    Elevated Troponin  History:        Patient has prior history of Echocardiogram examinations, most                  recent 12/01/2023. Cardiomyopathy and CHF, CAD, COPD and ESRD,                 Arrythmias:Atrial Fibrillation and Tachycardia,                 Signs/Symptoms:Hypotension and Chest Pain; Risk                 Factors:Hypertension, Dyslipidemia, Sleep Apnea and Current                 Smoker.  Sonographer:    Thea Norlander RCS Referring Phys: SUBRINA SUNDIL IMPRESSIONS  1. Left ventricular ejection fraction, by estimation, is 30 to 35%. The left ventricle has moderately decreased function. The left ventricle demonstrates global hypokinesis. The left ventricular internal cavity size was moderately dilated. Left ventricular diastolic parameters are consistent with Grade I diastolic dysfunction (impaired relaxation).  2. Right ventricular systolic function is normal. The right ventricular size is normal. Tricuspid regurgitation signal is  inadequate for assessing PA pressure.  3. The mitral valve is abnormal. Mild mitral valve regurgitation. No evidence of mitral stenosis.  4. The tricuspid valve is abnormal.  5. The aortic valve is tricuspid. Aortic valve regurgitation is not visualized. No aortic stenosis is present.  6. Aortic dilatation noted. There is mild dilatation of the ascending aorta, measuring 36 mm.  7. The inferior vena cava is normal in size with greater than 50% respiratory variability, suggesting right atrial pressure of 3 mmHg. FINDINGS  Left Ventricle: Left ventricular ejection fraction, by estimation, is 30 to 35%. The left ventricle has moderately decreased function. The left ventricle demonstrates global hypokinesis. The left ventricular internal cavity size was moderately dilated. There is no left ventricular hypertrophy. Left ventricular diastolic parameters are consistent with Grade I diastolic dysfunction (impaired relaxation). Normal left ventricular filling pressure. Right Ventricle: The right ventricular size is normal. Right vetricular wall thickness was not well visualized.  Right ventricular systolic function is normal. Tricuspid regurgitation signal is inadequate for assessing PA pressure. Left Atrium: Left atrial size was normal in size. Right Atrium: Right atrial size was normal in size. Pericardium: There is no evidence of pericardial effusion. Mitral Valve: The mitral valve is abnormal. Mild mitral valve regurgitation. No evidence of mitral valve stenosis. Tricuspid Valve: The tricuspid valve is abnormal. Tricuspid valve regurgitation is mild . No evidence of tricuspid stenosis. Aortic Valve: The aortic valve is tricuspid. Aortic valve regurgitation is not visualized. No aortic stenosis is present. Aortic valve mean gradient measures 4.8 mmHg. Aortic valve peak gradient measures 9.1 mmHg. Aortic valve area, by VTI measures 2.38 cm. Pulmonic Valve: The pulmonic valve was not well visualized. Pulmonic valve regurgitation is not visualized. No evidence of pulmonic stenosis. Aorta: The aortic root is normal in size and structure and aortic dilatation noted. There is mild dilatation of the ascending aorta, measuring 36 mm. Venous: The inferior vena cava is normal in size with greater than 50% respiratory variability, suggesting right atrial pressure of 3 mmHg. IAS/Shunts: No atrial level shunt detected by color flow Doppler.  LEFT VENTRICLE PLAX 2D LVIDd:         6.50 cm      Diastology LVIDs:         5.40 cm      LV e' medial:    5.00 cm/s LV PW:         0.80 cm      LV E/e' medial:  10.7 LV IVS:        0.60 cm      LV e' lateral:   7.94 cm/s LVOT diam:     2.20 cm      LV E/e' lateral: 6.7 LV SV:         67 LV SV Index:   32 LVOT Area:     3.80 cm  LV Volumes (MOD) LV vol d, MOD A2C: 246.0 ml LV vol d, MOD A4C: 186.0 ml LV vol s, MOD A2C: 147.0 ml LV vol s, MOD A4C: 114.0 ml LV SV MOD A2C:     99.0 ml LV SV MOD A4C:     186.0 ml LV SV MOD BP:      94.5 ml RIGHT VENTRICLE             IVC RV S prime:     15.00 cm/s  IVC diam: 1.10 cm TAPSE (M-mode): 2.5 cm LEFT ATRIUM           Index  RIGHT ATRIUM           Index LA diam:      4.40 cm 2.08 cm/m   RA Area:     19.70 cm LA Vol (A2C): 47.7 ml 22.55 ml/m  RA Volume:   59.40 ml  28.09 ml/m LA Vol (A4C): 52.8 ml 24.97 ml/m  AORTIC VALVE AV Area (Vmax):    2.45 cm AV Area (Vmean):   2.33 cm AV Area (VTI):     2.38 cm AV Vmax:           150.49 cm/s AV Vmean:          101.273 cm/s AV VTI:            0.282 m AV Peak Grad:      9.1 mmHg AV Mean Grad:      4.8 mmHg LVOT Vmax:         96.93 cm/s LVOT Vmean:        62.000 cm/s LVOT VTI:          0.176 m LVOT/AV VTI ratio: 0.63  AORTA Ao Root diam: 3.20 cm Ao Asc diam:  3.60 cm MITRAL VALVE MV Area (PHT): 4.31 cm    SHUNTS MV Decel Time: 176 msec    Systemic VTI:  0.18 m MR Peak grad: 87.6 mmHg    Systemic Diam: 2.20 cm MR Vmax:      468.00 cm/s MV E velocity: 53.50 cm/s MV A velocity: 85.90 cm/s MV E/A ratio:  0.62 Dorn Ross MD Electronically signed by Dorn Ross MD Signature Date/Time: 12/11/2023/1:31:06 PM    Final     Lab Results: Recent Labs    12/11/23 0216 12/12/23 0551 12/13/23 0548  WBC 4.9 3.9* 4.4  HGB 11.5* 10.7* 10.9*  HCT 34.0* 32.0* 31.8*  PLT 180 177 186   BMET Recent Labs    12/11/23 0216 12/12/23 0551 12/13/23 0548  NA 139 138 138  K 3.3* 3.4* 3.9  CL 100 98 98  CO2 29 29 27   GLUCOSE 121* 82 80  BUN 7 10 15   CREATININE 5.67* 7.81* 9.74*  CALCIUM  8.6* 8.8* 9.2   LFT Recent Labs    12/10/23 1339 12/11/23 0216 12/13/23 0548  PROT 5.6*   < > 5.8*  ALBUMIN  2.2*   < > 2.1*  AST 249*   < > 168*  ALT 183*   < > 159*  ALKPHOS 83   < > 96  BILITOT 0.9   < > 1.0  BILIDIR 0.2  --   --   IBILI 0.7  --   --    < > = values in this interval not displayed.   PT/INR No results for input(s): LABPROT, INR in the last 72 hours.   Scheduled Meds:  amiodarone   200 mg Oral BID   atorvastatin   80 mg Oral QHS   carvedilol   6.25 mg Oral BID   Chlorhexidine  Gluconate Cloth  6 each Topical Q0600   docusate sodium   100 mg Oral BID    fluticasone  furoate-vilanterol  1 puff Inhalation Daily   [START ON 12/14/2023] heparin   2,000 Units Dialysis Once in dialysis   hydrOXYzine   25 mg Oral TID   isosorbide -hydrALAZINE   1 tablet Oral TID   lanthanum   2,000 mg Oral TID WC   And   lanthanum   1,000 mg Oral BID BM   mexiletine  300 mg Oral BID   pantoprazole   40 mg Oral BID   polyethylene glycol  17 g Oral Daily   sodium chloride  flush  3 mL Intravenous Q12H   Continuous Infusions:  anticoagulant sodium citrate      heparin         Patient profile:   63 year old male with history of CHF status post AICD, CAD, end-stage renal disease on dialysis, sleep apnea presents with epigastric discomfort shortness of breath and chest pain. CT abdomen pelvis shows large heterogeneously hypodense mass arising from pancreatic head also inseparable from descending duodenum measuring 5 x 3.8 x 4.7 cm concerning for malignancy.  Enlarged gastrohepatic ligament and celiac axis lymph nodes numerous prominent retroperitoneal lymph nodes, hypodense hepatic lesion adjacent to the falciform ligament generally characteristic and location appearance for focal fatty deposition but could be hepatic metastasis   Impression/Plan:   Pancreatic head mass with lymph node metastatic disease and elevated LFTs concerning for primary malignancy -CA 19-9 less than 2 -Continue renal diet, n.p.o. tomorrow morning. Plan for EUS Wednesday with Dr. Wilhelmenia FNA.  Discussed risk of infection, bleeding, pancreatitis with patient, agrees to proceed. -Patient was started on heparin  as Eliquis  was held, will need to hold heparin  6 hours prior. -Continue small frequent meals  Elevated LFTs    Latest Ref Rng & Units 12/13/2023    5:48 AM 12/12/2023    5:51 AM 12/11/2023    2:16 AM  Hepatic Function  Total Protein 6.5 - 8.1 g/dL 5.8  5.7  5.5   Albumin  3.5 - 5.0 g/dL 2.1  2.0  2.0   AST 15 - 41 U/L 168  176  237   ALT 0 - 44 U/L 159  164  182   Alk Phosphatase 38 - 126  U/L 96  88  97   Total Bilirubin 0.0 - 1.2 mg/dL 1.0  1.1  0.9    AST 831 (176)  ALT 159 (164)   Alkphos 96 TBili 1.0 Alk phos not elevated, total bilirubin normal, stable at this time Fatty liver versus potential metastasis seen on CT - no signs of obstruction at this time, monitor LFTs for obstruction, may need ERCP if become elevated  Normocytic anemia 12/13/2023  HGB 10.9 MCV 86.9 Platelets 186 Month ago hemoglobin 13 currently 10.9 No overt GI bleeding Potentially secondary to CKD/anemia of chronic disease -Continue to trend hemoglobin Get iron, B12, ferritin  CHF EF 30%, history of AICD Echo yesterday EF 30 to 35% grade 1 diastolic mild MR no aortic stenosis  Nonobstructive CAD with elevated troponins .No chest pain currently, most recent troponin 47 and stable  End-stage renal disease  on dialysis 3 times weekly  Morbid obesity  Emphysema  Principal Problem:   Pancreatic mass Active Problems:   Combined congestive systolic and diastolic heart failure (HCC)   Reactive airway disease   ESRD (end stage renal disease) (HCC)   Hypotension   Hypokalemia   Elevated troponin   History of ventricular tachycardia s/p ICD   Chest pain   Abdominal pain    LOS: 3 days   Victor Thompson  12/13/2023, 10:28 AM

## 2023-12-13 NOTE — Progress Notes (Addendum)
 PHARMACY - ANTICOAGULATION CONSULT NOTE  Pharmacy Consult for Heparin  (Eliquis  on hold) Indication: atrial fibrillation  Patient Measurements: Height: 5' 7 (170.2 cm) Weight: 98.6 kg (217 lb 6 oz) (bed) IBW/kg (Calculated) : 66.1 Heparin  Dosing Weight: 89 kg  Vital Signs: Temp: 97.6 F (36.4 C) (01/07 1950) Temp Source: Oral (01/07 1950) BP: 129/100 (01/07 1950) Pulse Rate: 69 (01/07 1950)  Labs: Recent Labs    12/11/23 0216 12/11/23 0400 12/12/23 0551 12/13/23 0548 12/13/23 0816 12/13/23 2137  HGB 11.5*  --  10.7* 10.9*  --   --   HCT 34.0*  --  32.0* 31.8*  --   --   PLT 180  --  177 186  --   --   APTT  --   --   --   --  >200* 152*  HEPARINUNFRC  --   --   --   --  >1.10*  --   CREATININE 5.67*  --  7.81* 9.74*  --   --   TROPONINIHS 47* 47*  --   --   --   --     Estimated Creatinine Clearance: 9 mL/min (A) (by C-G formula based on SCr of 9.74 mg/dL (H)).  Assessment: 63 yr old male on Eliquis  5 mg BID prior to admission for hx atrial fibrillation.  Eliquis  now held for EUS with FN biopsy of pancreatic head mass, possibly on 1/8 per GI. Pharmacy consulted to begin IV heparin  bridge.    1/7: aPTT 152 seconds and after re-draw came back at 151 seconds (drawn appropriately) after dose reduction to 1000 units/hr (supratherapeutic). No issues with heparin  running continuously or signs/symptoms of bleeding  Goal of Therapy:  Heparin  level 0.3-0.7 units/ml aPTT 66-102 seconds Monitor platelets by anticoagulation protocol: Yes   Plan:  Hold heparin  x 1 hour then decrease rate to 800 units / hr aPTT and heparin  level ~8 hours after drip starts. Daily aPTT and heparin  level until correlating; daily CBC. Heparin  to be off at 0600 on 1/8 for EUS  Lynwood Poplar, PharmD. Clinical Pharmacist 12/13/2023 11:38 PM

## 2023-12-14 ENCOUNTER — Encounter (HOSPITAL_COMMUNITY): Payer: Self-pay | Admitting: Internal Medicine

## 2023-12-14 ENCOUNTER — Encounter (HOSPITAL_COMMUNITY): Admission: EM | Disposition: E | Payer: Self-pay | Source: Home / Self Care | Attending: Internal Medicine

## 2023-12-14 ENCOUNTER — Other Ambulatory Visit: Payer: Self-pay | Admitting: Physician Assistant

## 2023-12-14 ENCOUNTER — Telehealth: Payer: Self-pay

## 2023-12-14 ENCOUNTER — Inpatient Hospital Stay (HOSPITAL_COMMUNITY): Payer: 59 | Admitting: Anesthesiology

## 2023-12-14 DIAGNOSIS — I5042 Chronic combined systolic (congestive) and diastolic (congestive) heart failure: Secondary | ICD-10-CM | POA: Diagnosis not present

## 2023-12-14 DIAGNOSIS — R590 Localized enlarged lymph nodes: Secondary | ICD-10-CM

## 2023-12-14 DIAGNOSIS — K8689 Other specified diseases of pancreas: Secondary | ICD-10-CM | POA: Diagnosis not present

## 2023-12-14 DIAGNOSIS — J449 Chronic obstructive pulmonary disease, unspecified: Secondary | ICD-10-CM | POA: Diagnosis not present

## 2023-12-14 DIAGNOSIS — R7989 Other specified abnormal findings of blood chemistry: Secondary | ICD-10-CM | POA: Diagnosis not present

## 2023-12-14 DIAGNOSIS — E876 Hypokalemia: Secondary | ICD-10-CM | POA: Diagnosis not present

## 2023-12-14 DIAGNOSIS — K2970 Gastritis, unspecified, without bleeding: Secondary | ICD-10-CM

## 2023-12-14 DIAGNOSIS — K317 Polyp of stomach and duodenum: Secondary | ICD-10-CM

## 2023-12-14 DIAGNOSIS — K828 Other specified diseases of gallbladder: Secondary | ICD-10-CM | POA: Diagnosis not present

## 2023-12-14 DIAGNOSIS — K3189 Other diseases of stomach and duodenum: Secondary | ICD-10-CM

## 2023-12-14 DIAGNOSIS — K449 Diaphragmatic hernia without obstruction or gangrene: Secondary | ICD-10-CM

## 2023-12-14 DIAGNOSIS — I9589 Other hypotension: Secondary | ICD-10-CM

## 2023-12-14 DIAGNOSIS — I2510 Atherosclerotic heart disease of native coronary artery without angina pectoris: Secondary | ICD-10-CM | POA: Diagnosis not present

## 2023-12-14 DIAGNOSIS — K2289 Other specified disease of esophagus: Secondary | ICD-10-CM | POA: Diagnosis not present

## 2023-12-14 HISTORY — PX: BIOPSY: SHX5522

## 2023-12-14 HISTORY — PX: HEMOSTASIS CLIP PLACEMENT: SHX6857

## 2023-12-14 HISTORY — PX: FINE NEEDLE ASPIRATION: SHX5430

## 2023-12-14 HISTORY — PX: ESOPHAGOGASTRODUODENOSCOPY (EGD) WITH PROPOFOL: SHX5813

## 2023-12-14 HISTORY — PX: UPPER ESOPHAGEAL ENDOSCOPIC ULTRASOUND (EUS): SHX6562

## 2023-12-14 HISTORY — PX: POLYPECTOMY: SHX5525

## 2023-12-14 LAB — CBC
HCT: 35.5 % — ABNORMAL LOW (ref 39.0–52.0)
Hemoglobin: 12.3 g/dL — ABNORMAL LOW (ref 13.0–17.0)
MCH: 29.9 pg (ref 26.0–34.0)
MCHC: 34.6 g/dL (ref 30.0–36.0)
MCV: 86.4 fL (ref 80.0–100.0)
Platelets: 232 K/uL (ref 150–400)
RBC: 4.11 MIL/uL — ABNORMAL LOW (ref 4.22–5.81)
RDW: 17.3 % — ABNORMAL HIGH (ref 11.5–15.5)
WBC: 4.6 K/uL (ref 4.0–10.5)
nRBC: 0 % (ref 0.0–0.2)

## 2023-12-14 LAB — COMPREHENSIVE METABOLIC PANEL
ALT: 162 U/L — ABNORMAL HIGH (ref 0–44)
AST: 160 U/L — ABNORMAL HIGH (ref 15–41)
Albumin: 2.2 g/dL — ABNORMAL LOW (ref 3.5–5.0)
Alkaline Phosphatase: 104 U/L (ref 38–126)
Anion gap: 16 — ABNORMAL HIGH (ref 5–15)
BUN: 13 mg/dL (ref 6–20)
CO2: 25 mmol/L (ref 22–32)
Calcium: 9.3 mg/dL (ref 8.9–10.3)
Chloride: 93 mmol/L — ABNORMAL LOW (ref 98–111)
Creatinine, Ser: 7.01 mg/dL — ABNORMAL HIGH (ref 0.61–1.24)
GFR, Estimated: 8 mL/min — ABNORMAL LOW (ref >60)
Glucose, Bld: 79 mg/dL (ref 70–99)
Potassium: 4 mmol/L (ref 3.5–5.1)
Sodium: 134 mmol/L — ABNORMAL LOW (ref 135–145)
Total Bilirubin: 1.1 mg/dL (ref 0.0–1.2)
Total Protein: 6.2 g/dL — ABNORMAL LOW (ref 6.5–8.1)

## 2023-12-14 LAB — APTT: aPTT: 151 s — ABNORMAL HIGH (ref 24–36)

## 2023-12-14 LAB — MAGNESIUM: Magnesium: 2 mg/dL (ref 1.7–2.4)

## 2023-12-14 SURGERY — UPPER ESOPHAGEAL ENDOSCOPIC ULTRASOUND (EUS)
Anesthesia: General

## 2023-12-14 MED ORDER — LACTATED RINGERS IV SOLN: INTRAVENOUS | Status: DC | PRN

## 2023-12-14 MED ORDER — PROPOFOL 10 MG/ML IV BOLUS
INTRAVENOUS | Status: DC | PRN
  Administered 2023-12-14: 100 mg via INTRAVENOUS

## 2023-12-14 MED ORDER — LACTATED RINGERS IV SOLN
INTRAVENOUS | Status: AC
Start: 2023-12-14 — End: 2023-12-15

## 2023-12-14 MED ORDER — SUGAMMADEX SODIUM 200 MG/2ML IV SOLN
INTRAVENOUS | Status: DC | PRN
  Administered 2023-12-14: 200 mg via INTRAVENOUS

## 2023-12-14 MED ORDER — HEPARIN (PORCINE) 25000 UT/250ML-% IV SOLN
800.0000 [IU]/h | INTRAVENOUS | Status: AC
  Administered 2023-12-14: 800 [IU]/h via INTRAVENOUS

## 2023-12-14 MED ORDER — FENTANYL CITRATE (PF) 100 MCG/2ML IJ SOLN
INTRAMUSCULAR | Status: AC
  Filled 2023-12-14: qty 4

## 2023-12-14 MED ORDER — DEXAMETHASONE SODIUM PHOSPHATE 10 MG/ML IJ SOLN
INTRAMUSCULAR | Status: DC | PRN
  Administered 2023-12-14: 10 mg via INTRAVENOUS

## 2023-12-14 MED ORDER — CHLORHEXIDINE GLUCONATE CLOTH 2 % EX PADS
6.0000 | MEDICATED_PAD | Freq: Every day | CUTANEOUS | Status: DC
  Administered 2023-12-15 – 2023-12-19 (×5): 6 via TOPICAL

## 2023-12-14 MED ORDER — PHENYLEPHRINE HCL (PRESSORS) 10 MG/ML IV SOLN
INTRAVENOUS | Status: DC | PRN
  Administered 2023-12-14: 80 ug via INTRAVENOUS

## 2023-12-14 MED ORDER — FENTANYL CITRATE (PF) 100 MCG/2ML IJ SOLN
INTRAMUSCULAR | Status: DC | PRN
  Administered 2023-12-14: 100 ug via INTRAVENOUS
  Administered 2023-12-14: 50 ug via INTRAVENOUS

## 2023-12-14 MED ORDER — LIDOCAINE 2% (20 MG/ML) 5 ML SYRINGE
INTRAMUSCULAR | Status: DC | PRN
  Administered 2023-12-14: 100 mg via INTRAVENOUS

## 2023-12-14 MED ORDER — SUCCINYLCHOLINE CHLORIDE 200 MG/10ML IV SOSY
PREFILLED_SYRINGE | INTRAVENOUS | Status: DC | PRN
  Administered 2023-12-14: 150 mg via INTRAVENOUS

## 2023-12-14 MED ORDER — PHENYLEPHRINE HCL-NACL 20-0.9 MG/250ML-% IV SOLN
INTRAVENOUS | Status: DC | PRN
  Administered 2023-12-14: 50 ug/min via INTRAVENOUS

## 2023-12-14 MED ORDER — ROCURONIUM BROMIDE 10 MG/ML (PF) SYRINGE
PREFILLED_SYRINGE | INTRAVENOUS | Status: DC | PRN
  Administered 2023-12-14: 40 mg via INTRAVENOUS
  Administered 2023-12-14: 20 mg via INTRAVENOUS

## 2023-12-14 MED ORDER — HEPARIN (PORCINE) 25000 UT/250ML-% IV SOLN
1300.0000 [IU]/h | INTRAVENOUS | Status: AC
  Administered 2023-12-15: 600 [IU]/h via INTRAVENOUS
  Administered 2023-12-16 – 2023-12-18 (×2): 700 [IU]/h via INTRAVENOUS
  Administered 2023-12-19: 850 [IU]/h via INTRAVENOUS
  Administered 2023-12-20: 1200 [IU]/h via INTRAVENOUS
  Filled 2023-12-14 (×6): qty 250

## 2023-12-14 MED ORDER — EPHEDRINE SULFATE (PRESSORS) 50 MG/ML IJ SOLN
INTRAMUSCULAR | Status: DC | PRN
  Administered 2023-12-14 (×4): 10 mg via INTRAVENOUS

## 2023-12-14 MED ORDER — SODIUM CHLORIDE 0.9 % IV SOLN: INTRAVENOUS | Status: DC

## 2023-12-14 MED ORDER — ONDANSETRON HCL 4 MG/2ML IJ SOLN
INTRAMUSCULAR | Status: DC | PRN
  Administered 2023-12-14: 4 mg via INTRAVENOUS

## 2023-12-14 MED ORDER — LABETALOL HCL 5 MG/ML IV SOLN
INTRAVENOUS | Status: DC | PRN
  Administered 2023-12-14: 2.5 mg via INTRAVENOUS

## 2023-12-14 MED ORDER — ONDANSETRON HCL 4 MG/2ML IJ SOLN
INTRAMUSCULAR | Status: AC
  Filled 2023-12-14: qty 2

## 2023-12-14 NOTE — Progress Notes (Signed)
 Patient was taken for the procedure at 1119m AM

## 2023-12-14 NOTE — Anesthesia Postprocedure Evaluation (Signed)
 Anesthesia Post Note  Patient: Victor Thompson  Procedure(s) Performed: UPPER ESOPHAGEAL ENDOSCOPIC ULTRASOUND (EUS) BIOPSY POLYPECTOMY FINE NEEDLE ASPIRATION (FNA) LINEAR HEMOSTASIS CLIP PLACEMENT     Patient location during evaluation: Endoscopy Anesthesia Type: General Level of consciousness: awake and alert Pain management: pain level controlled Vital Signs Assessment: post-procedure vital signs reviewed and stable Respiratory status: spontaneous breathing, nonlabored ventilation, respiratory function stable and patient connected to face mask oxygen  Cardiovascular status: blood pressure returned to baseline and stable Postop Assessment: no apparent nausea or vomiting Anesthetic complications: no  There were no known notable events for this encounter.  Last Vitals:  Vitals:   12/14/23 1440 12/14/23 1450  BP: 115/71 105/72  Pulse: (!) 58 (!) 59  Resp: (!) 21 20  Temp:    SpO2: 100% 100%    Last Pain:  Vitals:   12/14/23 1450  TempSrc:   PainSc: 0-No pain                 Corky Blumstein,W. EDMOND

## 2023-12-14 NOTE — Transfer of Care (Signed)
 Immediate Anesthesia Transfer of Care Note  Patient: Victor Thompson  Procedure(s) Performed: UPPER ESOPHAGEAL ENDOSCOPIC ULTRASOUND (EUS) BIOPSY POLYPECTOMY FINE NEEDLE ASPIRATION (FNA) LINEAR HEMOSTASIS CLIP PLACEMENT  Patient Location: Endoscopy Unit  Anesthesia Type:General  Level of Consciousness: awake, alert , and oriented  Airway & Oxygen  Therapy: Patient Spontanous Breathing and Patient connected to face mask oxygen   Post-op Assessment: Report given to RN and Post -op Vital signs reviewed and stable  Post vital signs: Reviewed and stable  Last Vitals:  Vitals Value Taken Time  BP    Temp    Pulse 58 12/14/23 1426  Resp 21 12/14/23 1426  SpO2 100 % 12/14/23 1426  Vitals shown include unfiled device data.  Last Pain:  Vitals:   12/14/23 1119  TempSrc:   PainSc: 2       Patients Stated Pain Goal: 0 (12/13/23 1707)  Complications: No notable events documented.

## 2023-12-14 NOTE — Progress Notes (Signed)
 [facility] Progress Note  Subjective: seen in room, HD last night w/ 3.5 L off. SOB better.   Vitals:   12/14/23 1430 12/14/23 1440 12/14/23 1450 12/14/23 1525  BP: 114/70 115/71 105/72 120/68  Pulse: (!) 58 (!) 58 (!) 59 62  Resp: (!) 24 (!) 21 20 18   Temp:      TempSrc:      SpO2: 100% 100% 100% 100%  Weight:      Height:        Exam: Gen alert, no distress No jvd or bruits Chest clear bilat to bases, no rales/ wheezing RRR no MRG Abd soft ntnd no mass or ascites +bs GU defer MS no joint effusions or deformity Ext no LE or UE edema, no wounds or ulcers Neuro is alert, Ox 3 , nf    AVF+bruit         Renal-related home meds: - eliquis  5 bid - coreg  6.25 bid, bidil  20-37.5 bid - fosrenol  2 gm ac tid - others: mexiletine 300 bid, PPI, symbicort , statin, amiodarone       [facility] HD: [facility]   4h   450 /1.5  102kg  2/2 bath   AVF   Heparin  3000 - hect 9 micrograms three times per week - no esa   Assessment/ Plan: Pancreatic mass - w/ abd pain, CT +for mass a head of pancreas, also multiple local enlarged LN's. GI consulting, plan for procedure Wednesday.  ESKD - on HD TTS. HD yesterday. Next HD tomorrow.  HTN - BP's are wnl, cont home meds Volume - no gross vol overload. CXR negative. 3.5 L off yesterday. UF 2-3 L w/ HD tomorrow.  Anemia of eskd - Hb 11, not on esa at OP unit. Follow.  Secondary hyperparathyroidism - CCa in range. Cont binders w/ meals.  H/o combined heart failure H/o VT storm - sp ICD and takes mexilitene, BB and amiodarone      Myer Fret MD  [facility] 12/14/2023, 4:06 PM  Recent Labs  Lab 12/12/23 0051 12/12/23 0551 12/13/23 0548 12/14/23 0653  HGB  --    < > 10.9* 12.3*  ALBUMIN   --    < > 2.1* 2.2*  CALCIUM   --    < > 9.2 9.3  PHOS 3.5  --   --   --   CREATININE  --    < > 9.74* 7.01*  K  --    < > 3.9 4.0   < > = values in this interval not displayed.   Recent Labs  Lab 12/13/23 0816  IRON 61  TIBC 150*   FERRITIN 2,103*   Inpatient medications:  amiodarone   200 mg Oral BID   atorvastatin   80 mg Oral QHS   carvedilol   6.25 mg Oral BID   Chlorhexidine  Gluconate Cloth  6 each Topical Q0600   docusate sodium   100 mg Oral BID   fluticasone  furoate-vilanterol  1 puff Inhalation Daily   hydrOXYzine   25 mg Oral TID   isosorbide -hydrALAZINE   1 tablet Oral TID   lanthanum   2,000 mg Oral TID WC   And   lanthanum   1,000 mg Oral BID BM   mexiletine  300 mg Oral BID   pantoprazole   40 mg Oral BID   polyethylene glycol  17 g Oral Daily   sodium chloride  flush  3 mL Intravenous Q12H    [START ON 12/15/2023] heparin      acetaminophen  **OR** acetaminophen , levalbuterol , ondansetron  **OR** ondansetron  (ZOFRAN ) IV, sodium chloride   flush

## 2023-12-14 NOTE — Progress Notes (Signed)
 PHARMACY - ANTICOAGULATION CONSULT NOTE  Pharmacy Consult for Heparin  (Eliquis  on hold) Indication: atrial fibrillation  Allergies  Allergen Reactions   Ace Inhibitors Swelling    Swelling of the tongue   Influenza Vaccines Hives and Swelling    SWELLING REACTION UNSPECIFIED    Ketorolac Swelling    SWELLING REACTION UNSPECIFIED    Lidocaine  Swelling    TONGUE SWELLS   Lisinopril Swelling    TONGUE SWELLING Pt reported problem with a BP med which sounded like  lisinopril  But as of 09/19/06,pt had tolerated altace without problem   Penicillins Swelling    TONGUE SWELLS Has patient had a PCN reaction causing immediate rash, facial/tongue/throat swelling, SOB or lightheadedness with hypotension: Yes Has patient had a PCN reaction causing severe rash involving mucus membranes or skin necrosis: No Has patient had a PCN reaction that required hospitalization No Has patient had a PCN reaction occurring within the last 10 years: Yes If all of the above answers are NO, then may proceed with Cephalosporin use.     Patient Measurements: Height: 5' 7 (170.2 cm) Weight: 101.6 kg (223 lb 15.8 oz) IBW/kg (Calculated) : 66.1 Heparin  Dosing Weight: 89 kg  Vital Signs: Temp: 97.4 F (36.3 C) (01/08 1424) Temp Source: Temporal (01/08 1424) BP: 105/72 (01/08 1450) Pulse Rate: 59 (01/08 1450)  Labs: Recent Labs    12/12/23 0551 12/13/23 0548 12/13/23 0816 12/13/23 2137 12/14/23 0031 12/14/23 0653  HGB 10.7* 10.9*  --   --   --  12.3*  HCT 32.0* 31.8*  --   --   --  35.5*  PLT 177 186  --   --   --  232  APTT  --   --  >200* 152* 151*  --   HEPARINUNFRC  --   --  >1.10*  --   --   --   CREATININE 7.81* 9.74*  --   --   --  7.01*    Estimated Creatinine Clearance: 12.7 mL/min (A) (by C-G formula based on SCr of 7.01 mg/dL (H)).  Assessment: 63 yr old male on Eliquis  5 mg BID prior to admission for hx atrial fibrillation.  Eliquis  now held for EUS with FN biopsy of  pancreatic head mass. Pharmacy consulted to restart  IV heparin  bridge at midnight tonight post procedure  Eliquis  to not restart until at least 48 hours post procedure.    Goal of Therapy:  Heparin  level 0.3-0.7 units/ml aPTT 66-102 seconds Monitor platelets by anticoagulation protocol: Yes   Plan:  Heparin  to restart at 600 units / hr at midnight aPTT and heparin  level ~8 hours after drip starts. Daily aPTT and heparin  level until correlating; daily CBC.  Thank you Olam Monte, PharmD  12/14/2023,3:19 PM

## 2023-12-14 NOTE — Consult Note (Signed)
 [facility] Liaison Consult Note    12/14/2023  VANE YAPP Jul 10, 1961 [id number]  [facility] Patient: Victor Thompson, received notification on 12/13/23 patient was in HD and patient currently in procedure  Primary Care Provider:  [name], MD, with [facility], this provider is listed to provide the community transition of care follow up and TOC follow up calls  Insurance: [employer]  Patient is currently active with [facility] for care coordination services.  Patient has been engaged by a [employer].  The community based plan of care has focused on disease management and community resource support.    Plan: Continue to follow for any additional community care coordination needs for post hospital/community needs. Patient still in procedure at time of review.   Of note, [facility] services does not replace or interfere with any services that are needed or arranged by inpatient [facility] care management team.   Richerd Fish, RN, BSN, CCM [place]  [facility] Liaison Direct Dial: [phone] or secure chat Email: [email or web address]

## 2023-12-14 NOTE — Telephone Encounter (Signed)
-----   Message from Unk Lightning sent at 12/14/2023 12:20 PM EST ----- Regarding: h pylori Please notify patient H. Pylori [employer] testing negative.  Thanks-JLL

## 2023-12-14 NOTE — Op Note (Addendum)
 [facility] [name] Procedure Date : 12/14/2023 MRN: [id number] Attending MD: Aloha Finner , MD, [id number] Date of Birth: [birth year] CSN: [id number] Age: 63 Admit Type: Inpatient Procedure:                Upper EUS Indications:              Suspected mass in pancreas on CT scan, Upper                            abdominal pain Providers:                Aloha Finner, MD, Jacquelyn Jaci Shaw, RN,                            Curtistine Bishop, Technician Referring MD:             Inpatient medical service Medicines:                General Anesthesia Complications:            No immediate complications. Estimated Blood Loss:     Estimated blood loss was minimal. Procedure:                Pre-Anesthesia Assessment:                           - Prior to the procedure, a History and Physical                            was performed, and patient medications and                            allergies were reviewed. The patient's tolerance of                            previous anesthesia was also reviewed. The risks                            and benefits of the procedure and the sedation                            options and risks were discussed with the patient.                            All questions were answered, and informed consent                            was obtained. Prior Anticoagulants: The patient                            last took Eliquis  (apixaban ) 2 days prior to the                            procedure and last took heparin  on the day of the  procedure. ASA Grade Assessment: III - A patient                            with severe systemic disease. After reviewing the                            risks and benefits, the patient was deemed in                            satisfactory condition to undergo the procedure.                           After obtaining informed consent, the endoscope was                             passed under direct vision. Throughout the                            procedure, the patient's blood pressure, pulse, and                            oxygen  saturations were monitored continuously. The                            [id number]-H190 ([id number]) Olympus endoscope was introduced                            through the mouth, and advanced to the second part                            of duodenum. The [id number]-Q190V ([id number]) Olympus                            duodenoscope was introduced through the mouth, and                            advanced to the area of papilla. The [id number]                            ([id number]) Olympus linear ultrasound scope was                            introduced through the mouth, and advanced to the                            stomach for ultrasound examination from the                            esophagus and stomach. The upper EUS was                            accomplished without difficulty. The patient  tolerated the procedure. Scope In: Scope Out: Findings:      ENDOSCOPIC FINDING: :      White nummular sloughing lesions were noted in the proximal esophagus       and in the mid esophagus. Biopsies were taken with a cold forceps for       histology to rule out Candida.      The distal esophagus was normal in endoscopic appearance.      The Z-line was irregular and was found 41 cm from the incisors.      A 1 cm hiatal hernia was present.      Segmental moderate inflammation characterized by erosions, erythema,       friability and shallow linear ulcerations was found in the entire       examined stomach. Biopsies were taken with a cold forceps for histology       and Helicobacter pylori testing.      Localized moderately congested mucosa without active bleeding and with       no stigmata of bleeding was found in the duodenal sweep.      The minor and major papilla were prominent.      A single 8 mm sessile polyp was found  inferior to the major papilla. The       polyp was removed with a cold snare. Resection and retrieval were       complete. 2 hemostatic clips placed to decrease risk of bleeding.      There is no endoscopic evidence of stenosis in the entire examined       duodenum.      ENDOSONOGRAPHIC FINDING: :      An irregular masslike area was identified in the superior pancreatic       head. This area was heterogenous though hypoechogenicity was more noted       in the inferior aspect of this lesion. The area visualized in single       cross-section mass measured 29 mm by 24 mm in maximal cross-sectional       diameter. The outer margins were irregular. There was sonographic       evidence suggesting abutment of the portal vein. An intact interface was       seen between the mass and the superior mesenteric artery and celiac       trunk suggesting a lack of invasion. When moving into station 4 EUS, I       could not visualize as significant a masslike lesion that was noted on       the cross-sectional imaging. There is a 9 mm x 7 mm anechoic cyst in the       head of the pancreas. The remainder of the pancreas was examined. The       endosonographic appearance of parenchyma and the upstream pancreatic       duct indicated no duct dilation (PD head = 1.9 mm, PD neck = 1.7 mm, PD       body = 1.1 mm, PD tail = 0.7 mm), parenchymal atrophy was noted       throughout, honeycombing and lobularity was noted; and no ductal or       parenchymal calcifications. Fine needle biopsy was performed of the       masslike region. Color Doppler imaging was utilized prior to needle       puncture to confirm a lack of significant vascular structures within the  needle path. Seven passes were made with the 22 gauge Acquire biopsy       needle using a transduodenal approach. Visible cores of tissue were       obtained. Preliminary cytologic examination and touch preps were       performed. Final cytology results  are pending.      There was no sign of significant endosonographic abnormality in the       common bile duct (2.5 mm) and in the common hepatic duct (5.3 mm).      There was dilation of the gallbladder with the gallbladder wall       measuring 1.3 mm; mild biliary sludge noted. No evidence of       cholelithiasis      Endosonographic imaging of the ampulla showed no intramural       (subepithelial) lesion.      Two enlarged lymph nodes were visualized in the celiac region (level       20). The largest measured 7 mm by 5 mm in maximal cross-sectional       diameter. The nodes were round, isoechoic and had well defined margins.      Endosonographic imaging in the visualized portion of the liver showed no       mass.      The celiac region was visualized. Impression:               EGD impression:                           - White nummular sloughing lesions in esophageal                            mucosa. Biopsied.                           - Normal distal esophagus mucosa.                           - Z-line irregular, 41 cm from the incisors.                           - 1 cm hiatal hernia.                           - Gastritis. Biopsied.                           - Congested duodenal mucosa in the duodenal sweep.                           - Prominent minor papilla.                           - Prominent major papilla.                           - A single duodenal polyp inferior to the major                            papilla. Resected and retrieved. 2 clips placed to  decrease risk of bleeding.                           - No evidence of any mass or stricturing within the                            duodenum.                           EUS impression:                           - A masslike region was identified in the superior                            pancreatic head. Cytology results are pending.                            However, the endosonographic appearance  is                            suspicious for an undifferentiated malignancy                            (adenocarcinoma versus neuroendocrine tumor is                            considered). As documented above, concern that I                            could not visualize a mass the size noted on                            cross-sectional imaging. I do not believe what we                            sampled today is the lesion in the falciform that                            is discussed on the imaging study. The staging                            applies as follows if malignancy is confirmed based                            on EUS criteria T2 N0 Mx (though based on the size                            that is documented on cross-sectional imaging, this                            would be a T3 N0 MX by endosonographic criteria.  The staging applies if malignancy is confirmed.                            Fine needle biopsy performed.                           - There was no sign of significant pathology in the                            common bile duct and in the common hepatic duct.                           - There was dilation in the gallbladder which                            measured up to 1 mm.                           - Two enlarged lymph nodes were visualized in the                            celiac region (level 20). Tissue has not been                            obtained. However, the endosonographic appearance                            is suggestive of benign inflammatory changes. Recommendation:           - The patient will be observed post-procedure,                            until all discharge criteria are met.                           - Return patient to hospital ward for ongoing care.                           - Follow-up CA 19?9 level.                           - Send chromogranin A (order for tomorrow or add                            on).                            - Send CEA level (order for tomorrow or add-on).                           - Observe patient's clinical course.                           - Await cytology results, await path results and  await tumor markers.                           - If malignancy is noted on final pathology, then                            oncology evaluation is reasonable.                           - Monitor for signs/symptoms of bleeding,                            perforation, and infection. If issues please call                            our number to get further assistance as needed.                           - Would recommend restart heparin  drip at midnight                            without bolus and monitor accordingly. Wait at                            least 48 hours before reinitiation of DOAC.                           - Continue twice daily PPI as you are doing.                           - If pathology returned showing evidence of                            adenomatous tissue, then patient should consider                            repeat upper endoscopy in 1 year for the duodenal                            adenoma (pending his overall clinical status                            otherwise).                           - The findings and recommendations were discussed                            with the patient.                           - The findings and recommendations were discussed                            with the referring physician. Procedure Code(s):        ---  Professional ---                           [phone], Esophagogastroduodenoscopy, flexible,                            transoral; with transendoscopic ultrasound-guided                            intramural or transmural fine needle                            aspiration/biopsy(s), (includes endoscopic                            ultrasound examination limited to the esophagus,                             stomach or duodenum, and adjacent structures)                           43251, Esophagogastroduodenoscopy, flexible,                            transoral; with removal of tumor(s), polyp(s), or                            other lesion(s) by snare technique                           43239, 59, Esophagogastroduodenoscopy, flexible,                            transoral; with biopsy, single or multiple Diagnosis Code(s):        --- Professional ---                           K22.89, Other specified disease of esophagus                           K44.9, Diaphragmatic hernia without obstruction or                            gangrene                           K29.70, Gastritis, unspecified, without bleeding                           K31.89, Other diseases of stomach and duodenum                           K31.7, Polyp of stomach and duodenum                           K86.89, Other specified diseases of pancreas  K82.8, Other specified diseases of gallbladder                           R59.0, Localized enlarged lymph nodes                           R10.10, Upper abdominal pain, unspecified                           R93.3, Abnormal findings on diagnostic imaging of                            other parts of digestive tract CPT copyright 2022 [employer]. All rights reserved. The codes documented in this report are preliminary and upon coder review may  be revised to meet current compliance requirements. Aloha Finner, MD 12/14/2023 2:27:35 PM Number of Addenda: 0

## 2023-12-14 NOTE — Progress Notes (Signed)
 Patient requested CPAP this time. Called the RT

## 2023-12-14 NOTE — Anesthesia Preprocedure Evaluation (Addendum)
 Anesthesia Evaluation  Patient identified by MRN, date of birth, ID band Patient awake    Reviewed: Allergy  & Precautions, NPO status , Patient's Chart, lab work & pertinent test results, reviewed documented beta blocker date and time   History of Anesthesia Complications Negative for: history of anesthetic complications  Airway Mallampati: II  TM Distance: >3 FB Neck ROM: Limited    Dental no notable dental hx. (+) Teeth Intact, Dental Advisory Given, Poor Dentition   Pulmonary shortness of breath, asthma , sleep apnea , pneumonia, COPD Multiple prior episodes of angioedema of unknown trigger   breath sounds clear to auscultation       Cardiovascular hypertension, + CAD, + Past MI and +CHF (LVEF 20-25%)  + dysrhythmias (prior VT storm requiring extended hospitalization) Ventricular Tachycardia + Cardiac Defibrillator (deactivated)  Rhythm:Regular Rate:Normal     Neuro/Psych neg Seizures    GI/Hepatic ,GERD  ,,(+) neg Cirrhosis        Endo/Other    Renal/GU ESRF and DialysisRenal disease     Musculoskeletal   Abdominal   Peds  Hematology  (+) Blood dyscrasia, anemia   Anesthesia Other Findings   Reproductive/Obstetrics                             Anesthesia Physical Anesthesia Plan  ASA: 4  Anesthesia Plan: General   Post-op Pain Management: Minimal or no pain anticipated   Induction: Intravenous and Cricoid pressure planned  PONV Risk Score and Plan: 1 and Treatment may vary due to age or medical condition and Propofol  infusion  Airway Management Planned: Oral ETT  Additional Equipment: None  Intra-op Plan:   Post-operative Plan: Extubation in OR  Informed Consent: I have reviewed the patients History and Physical, chart, labs and discussed the procedure including the risks, benefits and alternatives for the proposed anesthesia with the patient or authorized representative  who has indicated his/her understanding and acceptance.     Dental advisory given  Plan Discussed with: CRNA and Anesthesiologist  Anesthesia Plan Comments: (  )        Anesthesia Quick Evaluation

## 2023-12-14 NOTE — Progress Notes (Signed)
 Patient received on the floor after endoscopy at 1520 PM via stretcher.Patient alert. But drowsy. Vitals done. SPO2 100%. Family at bed side.  Will continue to monitor

## 2023-12-14 NOTE — Telephone Encounter (Signed)
 My Chart message sent to patient with results.

## 2023-12-14 NOTE — Plan of Care (Signed)
  Problem: Education: Goal: Knowledge of General Education information will improve Description: Including pain rating scale, medication(s)/side effects and non-pharmacologic comfort measures Outcome: Progressing   Problem: Clinical Measurements: Goal: Will remain free from infection Outcome: Progressing   Problem: Coping: Goal: Level of anxiety will decrease Outcome: Progressing   Problem: Skin Integrity: Goal: Risk for impaired skin integrity will decrease Outcome: Progressing

## 2023-12-14 NOTE — Progress Notes (Signed)
 Progress Note   Patient: Victor Thompson FMW:[id number] DOB: [birth year] DOA: 12/10/2023     4 DOS: the patient was seen and examined on 12/14/2023   Brief hospital course: Dovid Bartko. Liskey is a 63 year old African-American male, with past medical history significant for ESRD on HD TTS schedule, ischemic cardiomyopathy, congestive heart failure with reduced EF (25 to 30%), history of CAD, VT storm status post ICD, currently on Eliquis  and amiodarone , reactive airway disease and hyperlipidemia presented with epigastric pain, shortness of breath, cough and chest pain.  Further workup in the ED showed pancreatic mass concerning for malignancy.  Patient was transferred to [facility] as patient will likely need endoscopic ultrasound guided biopsy of pancreatic mass.  GI is following him.   12/14/2023:  EUS guided biopsy of the pancreatic mass planned for today.  On heparin  drip.  Nephrology on board for Hemodialysis needs.  Assessment and Plan: Pancreatic Mass-  GI follow up appreciated. Plan for EUS today. Follow cytology. Heparin  drip to be restarted post procedure as instructed. Continue to trend LFT, Lipase levels. He is eating poor. Gentle IV hydration. Continue pain control, antiemetics PRN.  Essential Hypertension Caution with BP meds as his BP borderline low.  Combined systolic and diastolic CHF: Continue Coreg , bidil . No acute CHF exacerbation.  H/o ventricular tachycardia s/p ICD. Continue Amiodarone , Coreg , heparin  drip. Hold Eliquis .  Reactive airway disease- COVID, flu and RSV negative.   Chest x-ray unremarkable.  CTA chest unremarkable. Continue with Breo Ellipta  twice daily and Xopenex  every 6 hour as needed for any wheezing shortness of breath  ESRD on dialysis TTS schedule Last dialysis 12/12/22 with 3.5L removed. No sob. Nephrology on board for HD needs.   Obesity, Class II BMI 36.64 Diet, exercise and weight reduction advised. Continue CPAP at night for  OSA.    Out of bed to chair. Incentive spirometry. Nursing supportive care. Fall, aspiration precautions. DVT prophylaxis   Code Status: Full Code  Subjective: Patient is seen and examined today morning. He is lying comfortably on CPAP awaiting EGD/ EUS. Appetite poor. He is weak, has nausea.  Physical Exam: Vitals:   12/14/23 0431 12/14/23 0452 12/14/23 0829 12/14/23 0924  BP: (!) 99/55  (!) 101/59 (!) 120/90  Pulse: 69  63 66  Resp: 19  18   Temp: 98.4 F (36.9 C)  98.6 F (37 C)   TempSrc: Oral  Oral   SpO2: 96%  100%   Weight:  101.6 kg    Height:        General -  Middle aged ill obese African American male, no apparent distress on cpap HEENT - PERRLA, EOMI, atraumatic head, non tender sinuses. Lung - Clear, basal rales, no rhonchi, wheezes. Heart - S1, S2 heard, no murmurs, rubs, trace pedal edema. Abdomen - Soft, epigastric, RUQ tender, bowel sounds good Neuro - Alert, awake and oriented x 3, non focal exam. Skin - Warm and dry.  Data Reviewed:      Latest Ref Rng & Units 12/14/2023    6:53 AM 12/13/2023    5:48 AM 12/12/2023    5:51 AM  CBC  WBC 4.0 - 10.5 K/uL 4.6  4.4  3.9   Hemoglobin 13.0 - 17.0 g/dL 87.6  89.0  89.2   Hematocrit 39.0 - 52.0 % 35.5  31.8  32.0   Platelets 150 - 400 K/uL 232  186  177       Latest Ref Rng & Units 12/14/2023    6:53  AM 12/13/2023    5:48 AM 12/12/2023    5:51 AM  BMP  Glucose 70 - 99 mg/dL 79  80  82   BUN 6 - 20 mg/dL 13  15  10    Creatinine 0.61 - 1.24 mg/dL 2.98  0.25  2.18   Sodium 135 - 145 mmol/L 134  138  138   Potassium 3.5 - 5.1 mmol/L 4.0  3.9  3.4   Chloride 98 - 111 mmol/L 93  98  98   CO2 22 - 32 mmol/L 25  27  29    Calcium  8.9 - 10.3 mg/dL 9.3  9.2  8.8    No results found.   Family Communication: Discussed with wife over phone, she understand and agree. All questions answereed.    Disposition: Status is: Inpatient Remains inpatient appropriate because:GI work up of pancreatic mass.  Planned  Discharge Destination: Home     Time spent: 43 minutes  Author: Concepcion Riser, MD 12/14/2023 1:32 PM Secure chat 7am to 7pm For on call review [email or web address].

## 2023-12-14 NOTE — Interval H&P Note (Signed)
 History and Physical Interval Note:  12/14/2023 12:43 PM  Victor Thompson  has presented today for surgery, with the diagnosis of pancreatic head mass.  The various methods of treatment have been discussed with the patient and family. After consideration of risks, benefits and other options for treatment, the patient has consented to  Procedure(s): UPPER ESOPHAGEAL ENDOSCOPIC ULTRASOUND (EUS) (N/A) as a surgical intervention.  The patient's history has been reviewed, patient examined, no change in status, stable for surgery.  I have reviewed the patient's chart and labs.  Questions were answered to the patient's satisfaction.     The risks of an EUS including intestinal perforation, bleeding, infection, aspiration, and medication effects were discussed as was the possibility it may not give a definitive diagnosis if a biopsy is performed.  When a biopsy of the pancreas is done as part of the EUS, there is an additional risk of pancreatitis at the rate of about 1-2%.  It was explained that procedure related pancreatitis is typically mild, although it can be severe and even life threatening, which is why we do not perform random pancreatic biopsies and only biopsy a lesion/area we feel is concerning enough to warrant the risk.    Sarabelle Genson Mansouraty Jr

## 2023-12-14 NOTE — Progress Notes (Signed)
 Lab Result  H.pylori testing negative via [employer]. This will be communicated with patient.  Hyacinth Meeker, PA-C

## 2023-12-14 NOTE — Anesthesia Procedure Notes (Signed)
 Procedure Name: Intubation Date/Time: 12/14/2023 12:58 PM  Performed by: Loreda Rodriguez, CRNAPre-anesthesia Checklist: Patient identified, Emergency Drugs available, Suction available and Patient being monitored Patient Re-evaluated:Patient Re-evaluated prior to induction Oxygen  Delivery Method: Circle System Utilized Preoxygenation: Pre-oxygenation with 100% oxygen  Induction Type: IV induction Laryngoscope Size: Glidescope and 3 Grade View: Grade I Tube type: Oral Tube size: 7.0 mm Number of attempts: 1 Airway Equipment and Method: Stylet and Oral airway Placement Confirmation: ETT inserted through vocal cords under direct vision, positive ETCO2 and breath sounds checked- equal and bilateral Secured at: 22 cm Tube secured with: Tape Dental Injury: Teeth and Oropharynx as per pre-operative assessment

## 2023-12-15 DIAGNOSIS — R109 Unspecified abdominal pain: Secondary | ICD-10-CM | POA: Diagnosis not present

## 2023-12-15 DIAGNOSIS — K8689 Other specified diseases of pancreas: Secondary | ICD-10-CM | POA: Diagnosis not present

## 2023-12-15 DIAGNOSIS — K859 Acute pancreatitis without necrosis or infection, unspecified: Secondary | ICD-10-CM | POA: Diagnosis not present

## 2023-12-15 DIAGNOSIS — K8500 Idiopathic acute pancreatitis without necrosis or infection: Secondary | ICD-10-CM

## 2023-12-15 DIAGNOSIS — R079 Chest pain, unspecified: Secondary | ICD-10-CM | POA: Diagnosis not present

## 2023-12-15 DIAGNOSIS — K2970 Gastritis, unspecified, without bleeding: Secondary | ICD-10-CM

## 2023-12-15 DIAGNOSIS — R7989 Other specified abnormal findings of blood chemistry: Secondary | ICD-10-CM | POA: Diagnosis not present

## 2023-12-15 LAB — CBC
HCT: 36.1 % — ABNORMAL LOW (ref 39.0–52.0)
Hemoglobin: 12.1 g/dL — ABNORMAL LOW (ref 13.0–17.0)
MCH: 29.3 pg (ref 26.0–34.0)
MCHC: 33.5 g/dL (ref 30.0–36.0)
MCV: 87.4 fL (ref 80.0–100.0)
Platelets: 214 10*3/uL (ref 150–400)
RBC: 4.13 MIL/uL — ABNORMAL LOW (ref 4.22–5.81)
RDW: 17.4 % — ABNORMAL HIGH (ref 11.5–15.5)
WBC: 6.1 10*3/uL (ref 4.0–10.5)
nRBC: 0 % (ref 0.0–0.2)

## 2023-12-15 LAB — COMPREHENSIVE METABOLIC PANEL
ALT: 141 U/L — ABNORMAL HIGH (ref 0–44)
AST: 129 U/L — ABNORMAL HIGH (ref 15–41)
Albumin: 2.2 g/dL — ABNORMAL LOW (ref 3.5–5.0)
Alkaline Phosphatase: 109 U/L (ref 38–126)
Anion gap: 13 (ref 5–15)
BUN: 19 mg/dL (ref 6–20)
CO2: 24 mmol/L (ref 22–32)
Calcium: 9.5 mg/dL (ref 8.9–10.3)
Chloride: 96 mmol/L — ABNORMAL LOW (ref 98–111)
Creatinine, Ser: 8.82 mg/dL — ABNORMAL HIGH (ref 0.61–1.24)
GFR, Estimated: 6 mL/min — ABNORMAL LOW (ref >60)
Glucose, Bld: 73 mg/dL (ref 70–99)
Potassium: 4.6 mmol/L (ref 3.5–5.1)
Sodium: 133 mmol/L — ABNORMAL LOW (ref 135–145)
Total Bilirubin: 1.1 mg/dL (ref 0.0–1.2)
Total Protein: 6.3 g/dL — ABNORMAL LOW (ref 6.5–8.1)

## 2023-12-15 LAB — HEPARIN LEVEL (UNFRACTIONATED): Heparin Unfractionated: 0.54 [IU]/mL (ref 0.30–0.70)

## 2023-12-15 LAB — APTT: aPTT: 37 s — ABNORMAL HIGH (ref 24–36)

## 2023-12-15 LAB — LIPASE, BLOOD: Lipase: 501 U/L — ABNORMAL HIGH (ref 11–51)

## 2023-12-15 MED ORDER — MIDODRINE HCL 5 MG PO TABS
10.0000 mg | ORAL_TABLET | ORAL | Status: AC
  Administered 2023-12-15: 10 mg via ORAL
  Filled 2023-12-15: qty 2

## 2023-12-15 MED ORDER — HEPARIN SODIUM (PORCINE) 1000 UNIT/ML DIALYSIS: 1000.0000 [IU] | INTRAMUSCULAR | Status: DC | PRN

## 2023-12-15 MED ORDER — LIDOCAINE-PRILOCAINE 2.5-2.5 % EX CREA: 1.0000 | TOPICAL_CREAM | CUTANEOUS | Status: DC | PRN

## 2023-12-15 MED ORDER — CALCIUM CARBONATE ANTACID 500 MG PO CHEW
1.0000 | CHEWABLE_TABLET | Freq: Three times a day (TID) | ORAL | Status: DC | PRN
  Administered 2023-12-15 – 2023-12-16 (×2): 200 mg via ORAL
  Filled 2023-12-15 (×3): qty 1

## 2023-12-15 MED ORDER — PENTAFLUOROPROP-TETRAFLUOROETH EX AERO: 1.0000 | INHALATION_SPRAY | CUTANEOUS | Status: DC | PRN

## 2023-12-15 MED ORDER — HEPARIN SODIUM (PORCINE) 1000 UNIT/ML DIALYSIS
1000.0000 [IU] | INTRAMUSCULAR | Status: DC | PRN
Start: 2023-12-16 — End: 2023-12-15

## 2023-12-15 MED ORDER — NEPRO/CARBSTEADY PO LIQD: 237.0000 mL | ORAL | Status: DC | PRN

## 2023-12-15 MED ORDER — BISACODYL 10 MG RE SUPP: 10.0000 mg | Freq: Every day | RECTAL | Status: DC | PRN

## 2023-12-15 MED ORDER — SIMETHICONE 40 MG/0.6ML PO SUSP
40.0000 mg | Freq: Four times a day (QID) | ORAL | Status: DC | PRN
  Administered 2023-12-15: 40 mg via ORAL
  Filled 2023-12-15 (×2): qty 0.6

## 2023-12-15 MED ORDER — ALTEPLASE 2 MG IJ SOLR: 2.0000 mg | Freq: Once | INTRAMUSCULAR | Status: DC | PRN

## 2023-12-15 MED ORDER — LIDOCAINE HCL (PF) 1 % IJ SOLN: 5.0000 mL | INTRAMUSCULAR | Status: DC | PRN

## 2023-12-15 MED ORDER — SODIUM CHLORIDE 0.9 % IV BOLUS
1000.0000 mL | INTRAVENOUS | Status: AC
  Administered 2023-12-15: 1000 mL via INTRAVENOUS

## 2023-12-15 MED ORDER — HEPARIN SODIUM (PORCINE) 1000 UNIT/ML DIALYSIS
2000.0000 [IU] | Freq: Once | INTRAMUSCULAR | Status: AC
  Administered 2023-12-15: 2000 [IU] via INTRAVENOUS_CENTRAL

## 2023-12-15 MED ORDER — SENNA 8.6 MG PO TABS
1.0000 | ORAL_TABLET | Freq: Every day | ORAL | Status: DC | PRN
  Administered 2023-12-15: 8.6 mg via ORAL
  Filled 2023-12-15: qty 1

## 2023-12-15 MED ORDER — POLYETHYLENE GLYCOL 3350 17 G PO PACK
17.0000 g | PACK | Freq: Two times a day (BID) | ORAL | Status: DC
  Administered 2023-12-15 – 2023-12-26 (×13): 17 g via ORAL
  Filled 2023-12-15 (×19): qty 1

## 2023-12-15 MED ORDER — ANTICOAGULANT SODIUM CITRATE 4% (200MG/5ML) IV SOLN: 5.0000 mL | Status: DC | PRN

## 2023-12-15 NOTE — Progress Notes (Addendum)
 Progress Note   LOS: 5 days   Chief Complaint: Pancreatic head mass   Subjective   Yesterday patient's pain was 7 out of 10 in the epigastric area.  Patient states today his pain has progressively become more intense and states it is a 10 out of 10.    Objective   Vital signs in last 24 hours: Temp:  [97.7 F (36.5 C)-97.9 F (36.6 C)] 97.8 F (36.6 C) (01/09 1404) Pulse Rate:  [67-73] 72 (01/09 1525) Resp:  [16-30] 16 (01/09 1525) BP: (90-144)/(55-88) 93/67 (01/09 1525) SpO2:  [96 %-100 %] 100 % (01/09 1525) FiO2 (%):  [21 %] 21 % (01/09 0000) Weight:  [97.5 kg-101.2 kg] 97.5 kg (01/09 1434) Last BM Date : 12/13/23 Last BM recorded by nurses in past 5 days No data recorded  General:   male in no acute distress Heart:  Regular rate and rhythm; no murmurs Pulm: No distress.  Normal respiratory effort.  Using his CPAP Abdomen: Soft, epigastric tenderness, normal active bowel sounds Extremities:  No edema Neurologic:  Alert and  oriented x4;  No focal deficits.  Psych:  Cooperative. Normal mood and affect.  Intake/Output from previous day: 01/08 0701 - 01/09 0700 In: 1317.8 [P.O.:150; I.V.:1167.8] Out: 1 [Emesis/NG output:1] Intake/Output this shift: Total I/O In: 100 [P.O.:100] Out: 1600 [Other:1600]  Studies/Results: No results found.  Lab Results: Recent Labs    12/13/23 0548 12/14/23 0653 12/15/23 0656  WBC 4.4 4.6 6.1  HGB 10.9* 12.3* 12.1*  HCT 31.8* 35.5* 36.1*  PLT 186 232 214   BMET Recent Labs    12/13/23 0548 12/14/23 0653 12/15/23 0656  NA 138 134* 133*  K 3.9 4.0 4.6  CL 98 93* 96*  CO2 27 25 24   GLUCOSE 80 79 73  BUN 15 13 19   CREATININE 9.74* 7.01* 8.82*  CALCIUM  9.2 9.3 9.5   LFT Recent Labs    12/15/23 0656  PROT 6.3*  ALBUMIN  2.2*  AST 129*  ALT 141*  ALKPHOS 109  BILITOT 1.1   PT/INR No results for input(s): LABPROT, INR in the last 72 hours.   Scheduled Meds:  amiodarone   200 mg Oral BID   atorvastatin    80 mg Oral QHS   carvedilol   6.25 mg Oral BID   Chlorhexidine  Gluconate Cloth  6 each Topical Q0600   Chlorhexidine  Gluconate Cloth  6 each Topical Q0600   docusate sodium   100 mg Oral BID   fluticasone  furoate-vilanterol  1 puff Inhalation Daily   hydrOXYzine   25 mg Oral TID   isosorbide -hydrALAZINE   1 tablet Oral TID   lanthanum   2,000 mg Oral TID WC   And   lanthanum   1,000 mg Oral BID BM   mexiletine  300 mg Oral BID   pantoprazole   40 mg Oral BID   polyethylene glycol  17 g Oral BID   sodium chloride  flush  3 mL Intravenous Q12H   Continuous Infusions:  heparin  700 Units/hr (12/15/23 1106)   lactated ringers  40 mL/hr at 12/14/23 1728      Patient profile:   63 year old male with history of CHF status post AICD, CAD, end-stage renal disease on dialysis, sleep apnea presents with epigastric discomfort shortness of breath and chest pain. CT abdomen pelvis shows large heterogeneously hypodense mass arising from pancreatic head also inseparable from descending duodenum measuring 5 x 3.8 x 4.7 cm concerning for malignancy.  Enlarged gastrohepatic ligament and celiac axis lymph nodes numerous prominent retroperitoneal lymph nodes, hypodense  hepatic lesion adjacent to the falciform ligament generally characteristic and location appearance for focal fatty deposition but could be hepatic metastasis    Impression:   Pancreatic head mass with lymph node metastatic disease and elevated LFTs concerning for primary malignancy  EGD/EUS 1/8 showed white nummular lesions in esophageal mucosa, gastritis, prominent major and minor papilla, single duodenal polyp.  Masslike region in the superior pancreatic head.  Cytology results pending.  EUS suspicious for undifferentiated malignancy.  Dilation of the gallbladder measuring up to 1 mm. Pathology pending.  Post ERCP pancreatitis ERCP yesterday 1/8 now with worsening upper abdominal pain and lipase of 501.  Likely post ERCP  pancreatitis.  Normocytic anemia Hgb 12.1 Month ago hemoglobin 13 currently 10.9 No overt GI bleeding Potentially secondary to CKD/anemia of chronic disease -Continue to trend hemoglobin  CHF EF 30%, history of AICD Echo yesterday EF 30 to 35% grade 1 diastolic mild MR no aortic stenosis   Nonobstructive CAD with elevated troponins .No chest pain currently, most recent troponin 47 and stable   End-stage renal disease  on dialysis 3 times weekly   Plan:   - Continue to wait for pathology results - Fluid replacement lactated ringers  125-150cc/hr (3 cc/kg/hr) can decrease after 12-24 hours to 100-125cc/hr. (prevents ATN/necrosis) -Pain control per the inpatient medical team. -Encourage early ambulation -Advance diet as tolerated.  -If the patient not tolerating an oral diet, would recommend consideration of nutritional support with a nasogastric or nasojejunal postpyloric feedings. Monitor ileus. -Repeat CT scan if concern for necrotizing pancreatitis, pseudocyst, or abscess or no improvement in 48 hours- consider IR or surgical consult.  -Trend CBC, CMP, lipase.  Bayley CHRISTELLA Blower  12/15/2023, 4:40 PM   Attending physician's note   I have taken a history, reviewed the chart and examined the patient. I performed a substantive portion of this encounter, including complete performance of at least one of the key components, in conjunction with the APP. I agree with the APP's note, impression and recommendations.    Epigastric pain with elevated lipase concerning for pancreatitis s/p EUS with FNA Continue supportive care Await pathology results  IV fluids Pain control Plan to obtain repeat imaging with CT pancreas protocol tomorrow if continues to have persistent severe abdominal pain     K. Veena Kalima Saylor , MD [phone]

## 2023-12-15 NOTE — Progress Notes (Signed)
 Patient received back on the floor at1515Pm after HD. Alert and oriented. Will continue to monitor

## 2023-12-15 NOTE — Plan of Care (Signed)

## 2023-12-15 NOTE — Telephone Encounter (Signed)
 Victor Delon Gibson, PA  [place], [place], [place] Please call patient and check on him.  He was having appetite loss with nausea and occasional vomiting.  If he is no better we may need to discuss EGD at the hospital given that he is on dialysis.  Would also need to hold blood thinner.  Thanks, JL L

## 2023-12-15 NOTE — Hospital Course (Signed)
 63 year old African-American male, with past medical history significant for ESRD on HD TTS schedule, ischemic cardiomyopathy, congestive heart failure with reduced EF (25 to 30%), history of CAD, VT storm status post ICD, currently on Eliquis  and amiodarone , reactive airway disease and hyperlipidemia presented with epigastric pain, shortness of breath, cough and chest pain.  Further workup in the ED showed pancreatic mass concerning for malignancy.  Patient was transferred to [facility] as patient will likely need endoscopic ultrasound guided biopsy of pancreatic mass.  GI is following him.   12/14/2023:  EUS guided biopsy of the pancreatic mass planned for today.  On heparin  drip.  Nephrology on board for Hemodialysis needs.

## 2023-12-15 NOTE — Progress Notes (Deleted)
 Cardiology Office Note:  .   Date:  12/15/2023  ID:  Victor Thompson, DOB Aug 01, 1961, MRN [id number] PCP: [name], MD  [facility] Providers Cardiologist:  Ezra Shuck, MD Electrophysiologist:  Danelle Birmingham, MD {  History of Present Illness: .   Victor Thompson is a 63 y.o. male w/PMHx of CAD (PCI 2007), ICM w/hx of arrest and ICD (at [facility] in 2015), HTN, HLD, ESRF on HD, chronic CHF (systolic), OSA w/CPAP, morbid obesity, he has h/o seeing/following with pulmonary with episodes of angioedema of unknown trigger that requires epi pen use , AFib, flutter  Sept 2023, hospitalized with VT storm/refractory VT, multiple antiarrhythmic tried, required intubation, sedation, paralyzation >> 10/4 Palliative care consulted due to continued refractory VT. Pt ultimately decided to become DNR/DNI and ICD was deactivated  Suspect sarcoidosis and treatment initiated this stay HF described as end stage  No hospitalizations since  He last saw Dr. Birmingham 12/14/22, feeling suprisingly well, amioadrone 200mg  BID continued, no changes were made  Has seen Dr. Birmingham and the HF team several visits out patient,  most recently the HF team, 08/02/23, feeling well, DOE with stairs, his insurance denied cardiac PET x 2. ICD remains deactivated. Using CPAP  No med changes were made.  ER visit 09/07/23 for recurrent, brief palpitations, discharged from the ER  10/2: K+ 4.0 BUN/Creat 26/9.98 Mag 2.2 HS Trop 102 > 97 BNP 44.1 WBC 4.3 H/H 15/48 Plts 188  Pending a colonoscopy, requiring pre-procedure evaluation/visit RCRI score is 3, 11% I saw him 09/16/23, CP is atypical, reproducible today with palpation of his epigastrium, No-obstructive CAD by cath Sept 2023. He confirmed his wish to keep his ICD OFF. Volume managed with HD Elevated CV risk for procedures, though no symptoms to suggest any new cardiac testing needed prior to colonoscopy He declined labs for amiodarone   survellience, asked his HD center get them done  He saw Dr. Shuck 10/25/23, reported his optometrist noted deposits in his eyes with concerns about his amiodarone , doing well Pending insurance for PET, on steroid for suspect sarcoid, off bactrim  Prednisone  dose reduced to wean to off Planned for echo and to reach out to EP to reduce amiodarone  dose  In d/w Dr. Birmingham >> OK to reduce amiodarone  to 200mg  daily  Admitted 12/09/22 SOB, cough  > imaging for this noted pancreatic mass Resp viral panel neg Breo Ellipta , Xopenex , HD for volume > Biopsy > *** ***   Today's visit is scheduled as a 2 mo  ROS:   *** echo is pending *** volume, symptoms *** amio dose? 200daily?? BID?? Any new symptoms *** eliquis , dose, bleeding, labs *** amio labs  Device information BSCi S-ICD, implanted 2015 at [facility] + h/o VT, VF and appropriate shocks DEVICE IS OFF at pt request  Arrhythmia/AAD hx Has had hospitalizations with VT storms/refractory VT (last Oct 2023, required intubation and sedation ) Mexiletine added Oct 2023 Not felt to be a candidate for VT ablation (not likely to be successful) Amiodarone  started June 2023 REPORTED ALLERGY  TO LIDOCAINE  Quinidine and procainamide  tried oct 2023 both ineffective  Studies Reviewed: SABRA    EKG not done today  DEVICE interrogation done today and reviewed by myself Device is OFF No data will be available, not interrogated   [facility] 08/25/2022   1st Diag lesion is 60% stenosed.   Ost LAD to Prox LAD lesion is 40% stenosed.   Mid Cx lesion is 30% stenosed.   Prox  RCA lesion is 30% stenosed.   1. Elevated right and left heart filling pressures with pulmonary venous hypertension.  2. Nonobstructive CAD.    Echocardiogram 08/25/2022 LVEF 20-25%, Grade III DD, Mod LAE, mild RAE, mild to mod MR, CVP 15, cannot exclude small PFO.      Risk Assessment/Calculations:    Physical Exam:   VS:  There were no vitals taken for this visit.    Wt Readings from Last 3 Encounters:  12/15/23 223 lb 1.7 oz (101.2 kg)  12/01/23 228 lb (103.4 kg)  11/25/23 230 lb (104.3 kg)    GEN: Well nourished, well developed in no acute distress NECK: No JVD; No carotid bruits CARDIAC: *** RRR, 2/6 SM, no rubs, gallops RESPIRATORY:  *** CTA b/l without rales, wheezing or rhonchi  ABDOMEN: Soft, non-tender, non-distended EXTREMITIES:  *** No edema; No deformity   *** ICD site: is stable, no thinning, fluctuation, tethering  ASSESSMENT AND PLAN: .    VT Long history Numerous AAD tried Current is amiodarone  and mexiletine ?lidocaine  allergy   ***  ICD Remains off, pt has confirmed this is his wish Inquires about having it removed, though in discussing the procedure today, he decided not to want to have it extracted  CAD *** CP is atypical, reproducible today with palpation of his epigrastrum No-obstructive CAD by cath Sept 2023  ICM Chronic CHF (systolic) Suspect sarcoid Volume management with HD *** DOE with stairs especially, or longer distances of walking *** No rest SOB *** Intermittent bloating, he reports intermittently they have to slow his HD 2/2 low BPs but they are able to complete it    Paroxysmal Afib CHA2DS2Vasc is 3, on eliquis , *** appropriately dosed Intermittent palpitations described as intermittent/brief ?AF vs V ectopy, NSVTs  Secondary hypercoagulable state 2/2 AFib      Dispo: ***  Signed, Charlies Macario Arthur, PA-C

## 2023-12-15 NOTE — Progress Notes (Addendum)
 [facility] Progress Note  Subjective: seen in HD. 1.5 L off today.   Vitals:   12/15/23 1404 12/15/23 1415 12/15/23 1434 12/15/23 1525  BP: 105/71 108/68  93/67  Pulse: 72 73  72  Resp: 17 (!) 23  16  Temp: 97.8 F (36.6 C)     TempSrc: Oral     SpO2: 100% 100%  100%  Weight:   97.5 kg   Height:        Exam: Gen alert, no distress No jvd or bruits Chest clear bilat to bases, no rales/ wheezing RRR no MRG Abd soft ntnd no mass or ascites +bs GU defer MS no joint effusions or deformity Ext no LE or UE edema, no wounds or ulcers Neuro is alert, Ox 3 , nf    AVF+bruit         Renal-related home meds: - eliquis  5 bid - coreg  6.25 bid, bidil  20-37.5 bid - fosrenol  2 gm ac tid - others: mexiletine 300 bid, PPI, symbicort , statin, amiodarone       [facility] HD: [facility]   4h   450 /1.5  102kg  2/2 bath   AVF   Heparin  3000 - hect 9 micrograms three times per week - no esa   CXR 1/04 on admission - no acute disease   Assessment/ Plan: Pancreatic mass - w/ abd pain, CT +for mass a head of pancreas, also multiple local enlarged LN's. GI consulting. Per pmd / GI.  ESKD - on HD TTS. Had HD here Tuesday. Next HD today.  HTN - BP's are wnl, cont home meds Volume - no gross vol overload. CXR negative. 3.5 L off Tuesday. BP's dropped, only 1.5 off today. Is well under dry wt. Keep even next HD.  Anemia of eskd - Hb 11, not on esa at OP unit. Follow.  Secondary hyperparathyroidism - CCa in range. Cont binders w/ meals.  H/o combined heart failure H/o VT storm - sp ICD and takes mexilitene, BB and amiodarone      Myer Fret MD  [facility] 12/15/2023, 5:49 PM  Recent Labs  Lab 12/12/23 0051 12/12/23 0551 12/14/23 0653 12/15/23 0656  HGB  --    < > 12.3* 12.1*  ALBUMIN   --    < > 2.2* 2.2*  CALCIUM   --    < > 9.3 9.5  PHOS 3.5  --   --   --   CREATININE  --    < > 7.01* 8.82*  K  --    < > 4.0 4.6   < > = values in this interval not displayed.   Recent  Labs  Lab 12/13/23 0816  IRON 61  TIBC 150*  FERRITIN 2,103*   Inpatient medications:  amiodarone   200 mg Oral BID   atorvastatin   80 mg Oral QHS   carvedilol   6.25 mg Oral BID   Chlorhexidine  Gluconate Cloth  6 each Topical Q0600   Chlorhexidine  Gluconate Cloth  6 each Topical Q0600   docusate sodium   100 mg Oral BID   fluticasone  furoate-vilanterol  1 puff Inhalation Daily   hydrOXYzine   25 mg Oral TID   isosorbide -hydrALAZINE   1 tablet Oral TID   lanthanum   2,000 mg Oral TID WC   And   lanthanum   1,000 mg Oral BID BM   mexiletine  300 mg Oral BID   pantoprazole   40 mg Oral BID   polyethylene glycol  17 g Oral BID   sodium chloride  flush  3 mL Intravenous Q12H    heparin  700 Units/hr (12/15/23 1106)   acetaminophen  **OR** acetaminophen , bisacodyl , calcium  carbonate, levalbuterol , ondansetron  **OR** ondansetron  (ZOFRAN ) IV, senna, simethicone , sodium chloride  flush

## 2023-12-15 NOTE — Progress Notes (Signed)
 Patient was taken for the HD via bed at 1110 am

## 2023-12-15 NOTE — Progress Notes (Signed)
  Progress Note   Patient: Victor Thompson FMW:[id number] DOB: [birth year] DOA: 12/10/2023     5 DOS: the patient was seen and examined on 12/15/2023   Brief hospital course: 63 year old African-American male, with past medical history significant for ESRD on HD TTS schedule, ischemic cardiomyopathy, congestive heart failure with reduced EF (25 to 30%), history of CAD, VT storm status post ICD, currently on Eliquis  and amiodarone , reactive airway disease and hyperlipidemia presented with epigastric pain, shortness of breath, cough and chest pain.  Further workup in the ED showed pancreatic mass concerning for malignancy.  Patient was transferred to [facility] as patient will likely need endoscopic ultrasound guided biopsy of pancreatic mass.  GI is following him.   12/14/2023:  EUS guided biopsy of the pancreatic mass planned for today.  On heparin  drip.  Nephrology on board for Hemodialysis needs.  Assessment and Plan: Pancreatic Mass-  GI follow up appreciated. Pt now s/p EUS on 1/8 Follow cytology. Heparin  drip to be restarted post procedure as instructed. Continue to trend LFT, Lipase levels. Continue pain control, antiemetics PRN. Per GI, hold DOAC x for 48hrs after procedure (24 more hrs)   Essential Hypertension Caution with BP meds as his BP borderline low.   Combined systolic and diastolic CHF: Continue Coreg , bidil . No acute CHF exacerbation.   H/o ventricular tachycardia s/p ICD. Continue Amiodarone , Coreg , heparin  drip. Hold Eliquis .   Reactive airway disease- COVID, flu and RSV negative.   Chest x-ray unremarkable.  CTA chest unremarkable. Continue with Breo Ellipta  twice daily and Xopenex  every 6 hour as needed for any wheezing shortness of breath   ESRD on dialysis TTS schedule Last dialysis 12/12/22 with 3.5L removed. No sob. Nephrology on board for HD needs.   Obesity, Class II BMI 36.64 Diet, exercise and weight reduction advised. Continue CPAP at night for  OSA.  Constipation -Continue with scheduled cathartics -Increased miralax  to BID. Added senna -If no improvement, may need enema   Subjective: Complains of constipation. Seen on HD  Physical Exam: Vitals:   12/15/23 1354 12/15/23 1404 12/15/23 1415 12/15/23 1434  BP: 94/74 105/71 108/68   Pulse: 71 72 73   Resp: 18 17 (!) 23   Temp:  97.8 F (36.6 C)    TempSrc:  Oral    SpO2: 100% 100% 100%   Weight:    97.5 kg  Height:       General exam: Awake, laying in bed, in nad Respiratory system: Normal respiratory effort, no wheezing Cardiovascular system: regular rate, s1, s2 Gastrointestinal system: Soft, nondistended, positive BS Central nervous system: CN2-12 grossly intact, strength intact Extremities: Perfused, no clubbing Skin: Normal skin turgor, no notable skin lesions seen Psychiatry: Mood normal // no visual hallucinations   Data Reviewed:  Labs reviewed: Na 133, k 4.6, Lipase 501, AST 129, ALT 141  Family Communication: Pt in room, family not at bedside  Disposition: Status is: Inpatient Remains inpatient appropriate because: severity of illness  Planned Discharge Destination: Home    Author: Garnette Pelt, MD 12/15/2023 3:13 PM  For on call review [email or web address].

## 2023-12-15 NOTE — Progress Notes (Addendum)
 Received patient in bed to unit.  Alert and oriented.  Informed consent signed and in chart.   TX duration:3:16  Patient tolerated well.  Transported back to the room  Alert, without acute distress.  Hand-off given to patient's nurse.   Access used: right AVF Access issues: none  Total UF removed: 1.6L Medication(s) given:MYLICON   12/15/23 1404  Vitals  Temp Source Oral  BP 105/71  MAP (mmHg) 81  BP Location Left Arm  BP Method Automatic  Patient Position (if appropriate) Lying  Pulse Rate 72  Pulse Rate Source Monitor  ECG Heart Rate 72  Resp 17  Oxygen  Therapy  SpO2 100 %  O2 Device Nasal Cannula  O2 Flow Rate (L/min) 2 L/min  During Treatment Monitoring  Blood Flow Rate (mL/min) 349 mL/min  Arterial Pressure (mmHg) -142.21 mmHg  Venous Pressure (mmHg) 224.64 mmHg  TMP (mmHg) -10.1 mmHg  Ultrafiltration Rate (mL/min) 0 mL/min  Dialysate Flow Rate (mL/min) 299 ml/min  Dialysate Potassium Concentration 2  Dialysate Calcium  Concentration 2.5  Duration of HD Treatment -hour(s) 2.25 hour(s)  Cumulative Fluid Removed (mL) per Treatment  8706.76  HD Safety Checks Performed Yes  Intra-Hemodialysis Comments  (tx terminated per pt request Izetta Boehringer, PA at bedside blood was not returned due to machine screen freezing and not allowing manual return. early termination paper signed.)      Sydni Elizarraraz S [facility]

## 2023-12-15 NOTE — Progress Notes (Signed)
 PHARMACY - ANTICOAGULATION CONSULT NOTE  Pharmacy Consult for Heparin  (Eliquis  on hold) Indication: atrial fibrillation  Allergies  Allergen Reactions   Ace Inhibitors Swelling    Swelling of the tongue   Influenza Vaccines Hives and Swelling    SWELLING REACTION UNSPECIFIED    Ketorolac Swelling    SWELLING REACTION UNSPECIFIED    Lidocaine  Swelling    TONGUE SWELLS   Lisinopril Swelling    TONGUE SWELLING Pt reported problem with a BP med which sounded like  lisinopril  But as of 09/19/06,pt had tolerated altace without problem   Penicillins Swelling    TONGUE SWELLS Has patient had a PCN reaction causing immediate rash, facial/tongue/throat swelling, SOB or lightheadedness with hypotension: Yes Has patient had a PCN reaction causing severe rash involving mucus membranes or skin necrosis: No Has patient had a PCN reaction that required hospitalization No Has patient had a PCN reaction occurring within the last 10 years: Yes If all of the above answers are NO, then may proceed with Cephalosporin use.     Patient Measurements: Height: 5' 7 (170.2 cm) Weight: 101.2 kg (223 lb 1.7 oz) IBW/kg (Calculated) : 66.1 Heparin  Dosing Weight: 89 kg  Vital Signs: Temp: 97.7 F (36.5 C) (01/09 0757) Temp Source: Axillary (01/09 0409) BP: 144/88 (01/09 0757) Pulse Rate: 73 (01/09 0757)  Labs: Recent Labs    12/13/23 0548 12/13/23 0816 12/13/23 0816 12/13/23 2137 12/14/23 0031 12/14/23 0653 12/15/23 0656 12/15/23 0845  HGB 10.9*  --   --   --   --  12.3* 12.1*  --   HCT 31.8*  --   --   --   --  35.5* 36.1*  --   PLT 186  --   --   --   --  232 214  --   APTT  --  >200*   < > 152* 151*  --   --  37*  HEPARINUNFRC  --  >1.10*  --   --   --   --   --  0.54  CREATININE 9.74*  --   --   --   --  7.01* 8.82*  --    < > = values in this interval not displayed.    Estimated Creatinine Clearance: 10.1 mL/min (A) (by C-G formula based on SCr of 8.82 mg/dL  (H)).  Assessment: 63 yr old male on Eliquis  5 mg BID prior to admission for hx atrial fibrillation.  Eliquis  now held for EUS with FN biopsy of pancreatic head mass. Pharmacy consulted to restart  IV heparin  bridge at midnight 1/9 post procedure  Eliquis  to not restart until at least 48 hours post procedure.   Heparin  level therapeutic  Goal of Therapy:  Heparin  level 0.3-0.7 Monitor platelets by anticoagulation protocol: Yes   Plan:  Heparin  to 700 units / hr (to prevent drop to <0.3) Heparin  level and CBC daily.  Thank you Olam Monte, PharmD  12/15/2023,10:56 AM

## 2023-12-15 NOTE — Telephone Encounter (Signed)
 Beather Delon Gibson, PA  [place], [place], RN [place]- looks like he had EUS/EGD already. Thanks-JLL   ----- Message ----- From: Mercer Cristino SAILOR, RN Sent: 12/15/2023  11:52 AM EST To: Delon Gibson Beather, PA Subject: RE: please call                                He is still admitted it looks like. Do you know when he may be discharged?

## 2023-12-16 ENCOUNTER — Other Ambulatory Visit: Payer: Self-pay | Admitting: Internal Medicine

## 2023-12-16 ENCOUNTER — Ambulatory Visit: Payer: 59 | Admitting: Physician Assistant

## 2023-12-16 ENCOUNTER — Encounter (HOSPITAL_COMMUNITY): Payer: Self-pay | Admitting: Gastroenterology

## 2023-12-16 ENCOUNTER — Other Ambulatory Visit (HOSPITAL_COMMUNITY): Payer: Self-pay | Admitting: Family Medicine

## 2023-12-16 DIAGNOSIS — K8689 Other specified diseases of pancreas: Secondary | ICD-10-CM | POA: Diagnosis not present

## 2023-12-16 DIAGNOSIS — R7989 Other specified abnormal findings of blood chemistry: Secondary | ICD-10-CM | POA: Diagnosis not present

## 2023-12-16 DIAGNOSIS — K861 Other chronic pancreatitis: Secondary | ICD-10-CM

## 2023-12-16 DIAGNOSIS — R109 Unspecified abdominal pain: Secondary | ICD-10-CM | POA: Diagnosis not present

## 2023-12-16 DIAGNOSIS — K5903 Drug induced constipation: Secondary | ICD-10-CM

## 2023-12-16 DIAGNOSIS — R079 Chest pain, unspecified: Secondary | ICD-10-CM | POA: Diagnosis not present

## 2023-12-16 LAB — COMPREHENSIVE METABOLIC PANEL
ALT: 142 U/L — ABNORMAL HIGH (ref 0–44)
AST: 187 U/L — ABNORMAL HIGH (ref 15–41)
Albumin: 2.1 g/dL — ABNORMAL LOW (ref 3.5–5.0)
Alkaline Phosphatase: 111 U/L (ref 38–126)
Anion gap: 15 (ref 5–15)
BUN: 17 mg/dL (ref 6–20)
CO2: 24 mmol/L (ref 22–32)
Calcium: 9.4 mg/dL (ref 8.9–10.3)
Chloride: 95 mmol/L — ABNORMAL LOW (ref 98–111)
Creatinine, Ser: 7.66 mg/dL — ABNORMAL HIGH (ref 0.61–1.24)
GFR, Estimated: 7 mL/min — ABNORMAL LOW (ref >60)
Glucose, Bld: 79 mg/dL (ref 70–99)
Potassium: 4.5 mmol/L (ref 3.5–5.1)
Sodium: 134 mmol/L — ABNORMAL LOW (ref 135–145)
Total Bilirubin: 1 mg/dL (ref 0.0–1.2)
Total Protein: 6.2 g/dL — ABNORMAL LOW (ref 6.5–8.1)

## 2023-12-16 LAB — CBC
HCT: 37 % — ABNORMAL LOW (ref 39.0–52.0)
Hemoglobin: 12.5 g/dL — ABNORMAL LOW (ref 13.0–17.0)
MCH: 29.7 pg (ref 26.0–34.0)
MCHC: 33.8 g/dL (ref 30.0–36.0)
MCV: 87.9 fL (ref 80.0–100.0)
Platelets: 191 10*3/uL (ref 150–400)
RBC: 4.21 MIL/uL — ABNORMAL LOW (ref 4.22–5.81)
RDW: 17.5 % — ABNORMAL HIGH (ref 11.5–15.5)
WBC: 12.9 10*3/uL — ABNORMAL HIGH (ref 4.0–10.5)
nRBC: 0 % (ref 0.0–0.2)

## 2023-12-16 LAB — MAGNESIUM: Magnesium: 1.9 mg/dL (ref 1.7–2.4)

## 2023-12-16 LAB — HEPARIN LEVEL (UNFRACTIONATED): Heparin Unfractionated: 0.54 [IU]/mL (ref 0.30–0.70)

## 2023-12-16 LAB — LIPASE, BLOOD: Lipase: 590 U/L — ABNORMAL HIGH (ref 11–51)

## 2023-12-16 LAB — CYTOLOGY - NON PAP

## 2023-12-16 MED ORDER — CARVEDILOL 12.5 MG PO TABS
12.5000 mg | ORAL_TABLET | Freq: Two times a day (BID) | ORAL | Status: DC
  Administered 2023-12-17: 12.5 mg via ORAL
  Filled 2023-12-16: qty 1

## 2023-12-16 MED ORDER — CHLORHEXIDINE GLUCONATE CLOTH 2 % EX PADS
6.0000 | MEDICATED_PAD | Freq: Every day | CUTANEOUS | Status: DC
  Administered 2023-12-17 – 2023-12-26 (×8): 6 via TOPICAL

## 2023-12-16 NOTE — Progress Notes (Signed)
  Progress Note   Patient: Victor Thompson FMW:[id number] DOB: 05-29-1961 DOA: 12/10/2023     6 DOS: the patient was seen and examined on 12/16/2023   Brief hospital course: 63 year old African-American male, with past medical history significant for ESRD on HD TTS schedule, ischemic cardiomyopathy, congestive heart failure with reduced EF (25 to 30%), history of CAD, VT storm status post ICD, currently on Eliquis  and amiodarone , reactive airway disease and hyperlipidemia presented with epigastric pain, shortness of breath, cough and chest pain.  Further workup in the ED showed pancreatic mass concerning for malignancy.  Patient was transferred to [facility] as patient will likely need endoscopic ultrasound guided biopsy of pancreatic mass.  GI is following him.   12/14/2023:  EUS guided biopsy of the pancreatic mass planned for today.  On heparin  drip.  Nephrology on board for Hemodialysis needs.  Assessment and Plan: Pancreatic Mass-  GI follow up appreciated. Pt now s/p EUS on 1/8 Follow cytology. Heparin  drip to be restarted post procedure as instructed. LFT, Lipase levels trending up today, see below Continue pain control, antiemetics PRN. Per GI, hold DOAC x for 48hrs after procedure    Essential Hypertension BP stable   Combined systolic and diastolic CHF: Continue Coreg , bidil . No evidence of acute CHF exacerbation.   H/o ventricular tachycardia s/p ICD. Continue Amiodarone , Coreg , heparin  drip. Hold Eliquis .   Reactive airway disease- COVID, flu and RSV negative.   Chest x-ray unremarkable.  CTA chest unremarkable. Continue with Breo Ellipta  twice daily and Xopenex  every 6 hour as needed for any wheezing shortness of breath   ESRD on dialysis TTS schedule Last dialysis 12/12/22 with 3.5L removed. No sob. Nephrology on board for HD needs.   Obesity, Class II BMI 36.64 Diet, exercise and weight reduction advised. Continue CPAP at night for  OSA.  Constipation -Continue with scheduled cathartics -two bowel movements reported overnight  Post-ercp pancreatitis -Lipase is up to 590 -Per GI, plan to advance diet if he can tolerate. If pt is unable to tolerate diet, then consider nutritional support with NG and tube feed   Subjective: Reports several BM overnight. Still having abd discomfort  Physical Exam: Vitals:   12/16/23 0533 12/16/23 0557 12/16/23 0801 12/16/23 1317  BP: (!) 102/56 (!) 102/59 103/64 105/64  Pulse: 77 75 71 69  Resp: 19  16 16   Temp: 98.6 F (37 C)  98.2 F (36.8 C) 98.3 F (36.8 C)  TempSrc: Oral   Oral  SpO2: 97%  93% 96%  Weight:      Height:       General exam: Awake, laying in bed, in nad Respiratory system: Normal respiratory effort, no wheezing Cardiovascular system: regular rate, s1, s2 Gastrointestinal system: Soft, nondistended, positive BS Central nervous system: CN2-12 grossly intact, strength intact Extremities: Perfused, no clubbing Skin: Normal skin turgor, no notable skin lesions seen Psychiatry: Mood normal // no visual hallucinations   Data Reviewed:  Labs reviewed: Na 134, k 4.5, Lipase 590, AST 187, ALT 142  Family Communication: Pt in room, family at bedside  Disposition: Status is: Inpatient Remains inpatient appropriate because: severity of illness  Planned Discharge Destination:  Need PT eval    Author: Garnette Pelt, MD 12/16/2023 2:15 PM  For on call review [email or web address].

## 2023-12-16 NOTE — Plan of Care (Signed)
  Problem: Education: Goal: Knowledge of General Education information will improve Description: Including pain rating scale, medication(s)/side effects and non-pharmacologic comfort measures Outcome: Progressing   Problem: Health Behavior/Discharge Planning: Goal: Ability to manage health-related needs will improve Outcome: Progressing   Problem: Activity: Goal: Risk for activity intolerance will decrease Outcome: Progressing   Problem: Nutrition: Goal: Adequate nutrition will be maintained Outcome: Progressing   Problem: Coping: Goal: Level of anxiety will decrease Outcome: Progressing   Problem: Elimination: Goal: Will not experience complications related to bowel motility Outcome: Progressing Goal: Will not experience complications related to urinary retention Outcome: Progressing   Problem: Pain Management: Goal: General experience of comfort will improve Outcome: Progressing   Problem: Safety: Goal: Ability to remain free from injury will improve Outcome: Progressing   Problem: Skin Integrity: Goal: Risk for impaired skin integrity will decrease Outcome: Progressing

## 2023-12-16 NOTE — Progress Notes (Signed)
 Mobility Specialist Progress Note:    12/16/23 1514  Mobility  Activity Ambulated with assistance to bathroom  Level of Assistance Minimal assist, patient does 75% or more  Assistive Device Front wheel walker  Distance Ambulated (ft) 12 ft  Activity Response Tolerated well  Mobility Referral Yes  Mobility visit 1 Mobility  Mobility Specialist Start Time (ACUTE ONLY) 1505  Mobility Specialist Stop Time (ACUTE ONLY) 1514  Mobility Specialist Time Calculation (min) (ACUTE ONLY) 9 min   Pt requested assistance to sit up in bed. Received lying diagonal in bed. MinA to dangle at EOB. Tolerated well, c/o dizziness which resolved after a few moments. Required MinA to stand with RW and ambulate to bathroom. Left pt in bathroom, encouraged to pull bathroom call light when finished. Left with all needs met.    Harneet Noblett Mobility Specialist Please contact via Special Educational Needs Teacher or  Rehab office at [phone]

## 2023-12-16 NOTE — Progress Notes (Signed)
 Pt places self on/off. States home unit @ +10   12/15/23 2154  BiPAP/CPAP/SIPAP  $ Non-Invasive Home Ventilator  Subsequent  BiPAP/CPAP/SIPAP Pt Type Adult  BiPAP/CPAP/SIPAP Resmed  Mask Type Full face mask  EPAP 8 cmH2O  FiO2 (%) 21 %  BiPAP/CPAP /SiPAP Vitals  Pulse Rate 86  Resp 20  BP (!) 66/43  SpO2 96 %  MEWS Score/Color  MEWS Score 3  MEWS Score Color Yellow

## 2023-12-16 NOTE — Progress Notes (Signed)
 [facility] Progress Note  Subjective: seen in room. Having post ercp pancreatitis, and IVFs were given yesterday consisting of 1 L NS bolus. Had HD yest w/ 1600 cc off, BP's dropped into the 100s. Pt on RA this am.   Vitals:   12/16/23 0557 12/16/23 0801 12/16/23 1317 12/16/23 1635  BP: (!) 102/59 103/64 105/64 96/61  Pulse: 75 71 69 71  Resp:  16 16 16   Temp:  98.2 F (36.8 C) 98.3 F (36.8 C) 98.2 F (36.8 C)  TempSrc:   Oral Oral  SpO2:  93% 96% 95%  Weight:      Height:        Exam: Gen alert, no distress No jvd or bruits Chest clear bilat to bases, no rales/ wheezing RRR no MRG Abd soft ntnd no mass or ascites +bs GU defer MS no joint effusions or deformity Ext no LE or UE edema, no wounds or ulcers Neuro is alert, Ox 3 , nf    AVF+bruit         Renal-related home meds: - eliquis  5 bid - coreg  6.25 bid, bidil  20-37.5 bid - fosrenol  2 gm ac tid - others: mexiletine 300 bid, PPI, symbicort , statin, amiodarone       [facility] HD: [facility]   4h   450 /1.5  102kg  2/2 bath   AVF   Heparin  3000 - hect 9 micrograms three times per week - no esa   CXR 1/04 on admission - no acute disease   Assessment/ Plan: Pancreatic mass - w/ abd pain, CT +for mass a head of pancreas, also multiple local enlarged LN's. GI consulting. Per pmd / GI.  ESKD - on HD TTS. Had HD here tues/ Thursday. Next HD Sat.  HTN - need to lower the BP meds (bidil , coreg ) for now, BP's are soft, have d/w pmd. Will dc bidil  and cont coreg  6.25 bid w/ hold orders.  Volume - no gross vol overload. CXR negative. 3.5 L off tuesday and 1.6 off Thursday. Will lower vol goal w/ HD tomorrow.  Anemia of eskd - Hb 11, not on esa at OP unit. Follow.  Secondary hyperparathyroidism - CCa in range. Cont binders w/ meals.  H/o combined heart failure H/o VT storm - sp ICD and takes mexilitene, BB and amiodarone      Myer Fret MD  [facility] 12/16/2023, 5:21 PM  Recent Labs  Lab 12/12/23 0051  12/12/23 0551 12/15/23 0656 12/16/23 0759  HGB  --    < > 12.1* 12.5*  ALBUMIN   --    < > 2.2* 2.1*  CALCIUM   --    < > 9.5 9.4  PHOS 3.5  --   --   --   CREATININE  --    < > 8.82* 7.66*  K  --    < > 4.6 4.5   < > = values in this interval not displayed.   Recent Labs  Lab 12/13/23 0816  IRON 61  TIBC 150*  FERRITIN 2,103*   Inpatient medications:  amiodarone   200 mg Oral BID   atorvastatin   80 mg Oral QHS   carvedilol   12.5 mg Oral BID   Chlorhexidine  Gluconate Cloth  6 each Topical Q0600   Chlorhexidine  Gluconate Cloth  6 each Topical Q0600   docusate sodium   100 mg Oral BID   fluticasone  furoate-vilanterol  1 puff Inhalation Daily   hydrOXYzine   25 mg Oral TID   isosorbide -hydrALAZINE   1 tablet Oral TID  lanthanum   2,000 mg Oral TID WC   And   lanthanum   1,000 mg Oral BID BM   mexiletine  300 mg Oral BID   pantoprazole   40 mg Oral BID   polyethylene glycol  17 g Oral BID   sodium chloride  flush  3 mL Intravenous Q12H    heparin  700 Units/hr (12/16/23 1528)   acetaminophen  **OR** acetaminophen , bisacodyl , calcium  carbonate, levalbuterol , ondansetron  **OR** ondansetron  (ZOFRAN ) IV, senna, simethicone , sodium chloride  flush

## 2023-12-16 NOTE — Progress Notes (Signed)
SCD placed per order. 

## 2023-12-16 NOTE — Progress Notes (Signed)
 Inpatient Rehabilitation Admissions Coordinator   Per therapy recommendations patient was screened for CIR candidacy by Heron Leavell RN MSN. Current payor trends with [facility] medicare are unlikely to approve a CIR/AIR level rehab for this diagnosis and at  supervision level with mobility. I will not place a Rehab Consult at this time. Recommend other rehab venues to be pursued.   Jabier Leavell, RN, MSN Rehab Admissions Coordinator [phone] 12/16/2023 4:51 PM

## 2023-12-16 NOTE — Progress Notes (Addendum)
 Progress Note   LOS: 6 days   Chief Complaint: pancreatic head mass   Subjective   Patient found laying in hospital bed unable to get up without assistance. With aid of mobility specialist we were able to sit him up and provide him with his walker to use the bathroom. Patient state his pain is better compared to yesterday though he does note some GERD.   Objective   Vital signs in last 24 hours: Temp:  [98.2 F (36.8 C)-98.8 F (37.1 C)] 98.3 F (36.8 C) (01/10 1317) Pulse Rate:  [69-86] 69 (01/10 1317) Resp:  [16-20] 16 (01/10 1317) BP: (66-105)/(43-67) 105/64 (01/10 1317) SpO2:  [93 %-100 %] 96 % (01/10 1317) FiO2 (%):  [21 %] 21 % (01/09 2154) Weight:  [103.3 kg] 103.3 kg (01/10 0500) Last BM Date : 12/15/23 Last BM recorded by nurses in past 5 days No data recorded  General:   male in no acute distress Heart:  Regular rate and rhythm; no murmurs Pulm: Clear anteriorly; no wheezing Abdomen: soft, nondistended, normal bowel sounds in all quadrants. Nontender without guarding. No organomegaly appreciated. Extremities:  No edema Neurologic:  Alert and  oriented x4;  No focal deficits.  Psych:  Cooperative. Normal mood and affect.  Intake/Output from previous day: 01/09 0701 - 01/10 0700 In: 1207.6 [P.O.:200; I.V.:3; IV Piggyback:1004.6] Out: 1600  Intake/Output this shift: Total I/O In: 200 [P.O.:200] Out: -   Studies/Results: No results found.  Lab Results: Recent Labs    12/14/23 0653 12/15/23 0656 12/16/23 0759  WBC 4.6 6.1 12.9*  HGB 12.3* 12.1* 12.5*  HCT 35.5* 36.1* 37.0*  PLT 232 214 191   BMET Recent Labs    12/14/23 0653 12/15/23 0656 12/16/23 0759  NA 134* 133* 134*  K 4.0 4.6 4.5  CL 93* 96* 95*  CO2 25 24 24   GLUCOSE 79 73 79  BUN 13 19 17   CREATININE 7.01* 8.82* 7.66*  CALCIUM  9.3 9.5 9.4   LFT Recent Labs    12/16/23 0759  PROT 6.2*  ALBUMIN  2.1*  AST 187*  ALT 142*  ALKPHOS 111  BILITOT 1.0   PT/INR No results for  input(s): LABPROT, INR in the last 72 hours.   Scheduled Meds:  amiodarone   200 mg Oral BID   atorvastatin   80 mg Oral QHS   carvedilol   12.5 mg Oral BID   Chlorhexidine  Gluconate Cloth  6 each Topical Q0600   Chlorhexidine  Gluconate Cloth  6 each Topical Q0600   docusate sodium   100 mg Oral BID   fluticasone  furoate-vilanterol  1 puff Inhalation Daily   hydrOXYzine   25 mg Oral TID   isosorbide -hydrALAZINE   1 tablet Oral TID   lanthanum   2,000 mg Oral TID WC   And   lanthanum   1,000 mg Oral BID BM   mexiletine  300 mg Oral BID   pantoprazole   40 mg Oral BID   polyethylene glycol  17 g Oral BID   sodium chloride  flush  3 mL Intravenous Q12H   Continuous Infusions:  heparin  700 Units/hr (12/15/23 1106)      Patient profile:   63 year old male with history of CHF status post AICD, CAD, end-stage renal disease on dialysis, sleep apnea presents with epigastric discomfort shortness of breath and chest pain. CT abdomen pelvis shows large heterogeneously hypodense mass arising from pancreatic head also inseparable from descending duodenum measuring 5 x 3.8 x 4.7 cm concerning for malignancy.  Enlarged gastrohepatic ligament and celiac axis  lymph nodes numerous prominent retroperitoneal lymph nodes, hypodense hepatic lesion adjacent to the falciform ligament generally characteristic and location appearance for focal fatty deposition but could be hepatic metastasis    Impression:   Pancreatic head mass with lymph node metastatic disease and elevated LFTs concerning for primary malignancy  EGD/EUS 1/8 showed white nummular lesions in esophageal mucosa, gastritis, prominent major and minor papilla, single duodenal polyp.  Masslike region in the superior pancreatic head.  Cytology results pending.  EUS suspicious for undifferentiated malignancy.  Dilation of the gallbladder measuring up to 1 mm.  Pathology shows changes compatible with chronic pancreatitis. Negative for malignancy   The  cell block contains predominant benign pancreatic parenchyma with chronic pancreatitis, focal fibrosis and islet cell hyperplasia. Immunohistochemical stain show the islet cells are positive for synaptophysin and chromogranin A. Ki 67 proliferation index is low (<1%). The overall features are not diagnostic of neoplasm or malignancy. However, an unsampled mass lesion or malignancy cannot be excluded. If there is clinical suspicion for a malignancy, re-sampling for more representative materials will be helpful.    Post ERCP pancreatitis ERCP 1/8 now with worsening upper abdominal pain post op and lipase of 501.  Likely post ERCP pancreatitis. Currently resolving abdominal pain today   Normocytic anemia Hgb stable No overt GI bleeding Potentially secondary to CKD/anemia of chronic disease -Continue to trend hemoglobin   CHF EF 30%, history of AICD Echo yesterday EF 30 to 35% grade 1 diastolic mild MR no aortic stenosis   Nonobstructive CAD with elevated troponins .No chest pain currently, most recent troponin 47 and stable   End-stage renal disease  on dialysis 3 times weekly    Plan:   -- defer to Dr.  Wilhelmenia in regards to pathology results and decision to repeat EUS with biopsies or refer to tertiary center due to availability -- continue supportive care -- advance diet as tolerated -- continue IVF -- continue pain control -- if worsening pain, recommend CT scan -- trend CBC, CMP  Victor Thompson  12/16/2023, 3:10 PM   Attending physician's note   I have taken a history, reviewed the chart and examined the patient. I performed a substantive portion of this encounter, including complete performance of at least one of the key components, in conjunction with the APP. I agree with the APP's note, impression and recommendations.   EUS with FNA negative for malignant cell, possible sampling error. May need repeat imaging , EUS and FNA, will defer to Dr.Mansouraty for  management. He will be taking over inpatient GI ERCP service on Monday. Abd pain is improving, advance diet as tolerated Continue supportive care   The patient was provided an opportunity to ask questions and all were answered. The patient agreed with the plan and demonstrated an understanding of the instructions.   LOIS Wilkie Mcgee , MD [phone]

## 2023-12-16 NOTE — Telephone Encounter (Signed)
 OPENED IN ERROR

## 2023-12-16 NOTE — Progress Notes (Signed)
 PHARMACY - ANTICOAGULATION CONSULT NOTE  Pharmacy Consult for Heparin  (Eliquis  on hold) Indication: atrial fibrillation  Allergies  Allergen Reactions   Ace Inhibitors Swelling    Swelling of the tongue   Influenza Vaccines Hives and Swelling    SWELLING REACTION UNSPECIFIED    Ketorolac Swelling    SWELLING REACTION UNSPECIFIED    Lidocaine  Swelling    TONGUE SWELLS   Lisinopril Swelling    TONGUE SWELLING Pt reported problem with a BP med which sounded like  lisinopril  But as of 09/19/06,pt had tolerated altace without problem   Penicillins Swelling    TONGUE SWELLS Has patient had a PCN reaction causing immediate rash, facial/tongue/throat swelling, SOB or lightheadedness with hypotension: Yes Has patient had a PCN reaction causing severe rash involving mucus membranes or skin necrosis: No Has patient had a PCN reaction that required hospitalization No Has patient had a PCN reaction occurring within the last 10 years: Yes If all of the above answers are NO, then may proceed with Cephalosporin use.     Patient Measurements: Height: 5' 7 (170.2 cm) Weight: 103.3 kg (227 lb 11.8 oz) IBW/kg (Calculated) : 66.1 Heparin  Dosing Weight: 89 kg  Vital Signs: Temp: 98.2 F (36.8 C) (01/10 0801) Temp Source: Oral (01/10 0533) BP: 103/64 (01/10 0801) Pulse Rate: 71 (01/10 0801)  Labs: Recent Labs    12/13/23 2137 12/14/23 0031 12/14/23 9346 12/14/23 9346 12/15/23 0656 12/15/23 0845 12/16/23 0759  HGB  --   --  12.3*   < > 12.1*  --  12.5*  HCT  --   --  35.5*  --  36.1*  --  37.0*  PLT  --   --  232  --  214  --  191  APTT 152* 151*  --   --   --  37*  --   HEPARINUNFRC  --   --   --   --   --  0.54 0.54  CREATININE  --   --  7.01*  --  8.82*  --  7.66*   < > = values in this interval not displayed.    Estimated Creatinine Clearance: 11.7 mL/min (A) (by C-G formula based on SCr of 7.66 mg/dL (H)).  Assessment: 63 yr old male on Eliquis  5 mg BID prior to  admission for hx atrial fibrillation.  Eliquis  now held for EUS with FN biopsy of pancreatic head mass. Pharmacy consulted to restart  IV heparin  bridge at midnight 1/9 post procedure  Eliquis  to not restart until at least 48 hours post procedure completed on 1/8  Heparin  level therapeutic  Goal of Therapy:  Heparin  level 0.3-0.7 Monitor platelets by anticoagulation protocol: Yes   Plan:  Heparin  at 700 units / hr Heparin  level and CBC daily. Follow up plans to resume Eliquis   Thank you Olam Monte, PharmD  12/16/2023,10:12 AM

## 2023-12-16 NOTE — Evaluation (Signed)
 Physical Therapy Evaluation Patient Details Name: Victor Thompson MRN: [id number] DOB: [birth year] Today's Date: 12/16/2023  History of Present Illness  Pt is a 63 y/o M admitted on 12/10/23 after presenting with c/o epigastric pain, SOB, cough & chest pain. Workup showed pancreatic mass concerning for malignancy. PMH: ESRD on HD TTS, ischemic cardiomyopathy, CHF with reduced EF, CAD, VT storm s/p ICD, reactive airway disease, HLD  Clinical Impression  Pt seen for PT evaluation with pt received in bathroom. Pt able to transfer STS from toilet without assistance, ambulate to sink with RW & supervision. While standing at sink to perform hand hygiene nurse enters room & reports pt with abnormal telemetry & need to get back in bed. Pt assisted to bed, requiring mod assist for sit>supine. Pt endorses dizziness with standing, notes mid chest heartburn pain. Nurse in room to assess pt.  Pt reports prior to admission he was independent without AD, notes 1 fall recently, working at [employer], driving, & living with cousin in a 2 level home. Pt would benefit from ongoing acute PT services to address strengthening, balance, endurance to increase independence & progress to independent PLOF. Pt is a great candidate for post acute rehab >3 hours therapy/day for ongoing PT services with potential to return to mod I<>independent level.      If plan is discharge home, recommend the following: A little help with walking and/or transfers;A little help with bathing/dressing/bathroom;Assistance with cooking/housework;Assist for transportation;Help with stairs or ramp for entrance   Can travel by private vehicle        Equipment Recommendations Rolling walker (2 wheels)  Recommendations for Other Services  Rehab consult    Functional Status Assessment Patient has had a recent decline in their functional status and demonstrates the ability to make significant improvements in function in a reasonable and  predictable amount of time.     Precautions / Restrictions Precautions Precautions: Fall Precaution Comments: watch HR Restrictions Weight Bearing Restrictions Per Provider Order: No      Mobility  Bed Mobility Overal bed mobility: Needs Assistance Bed Mobility: Sit to Supine       Sit to supine: Mod assist (assistance to elevate BLE onto bed)        Transfers Overall transfer level: Needs assistance   Transfers: Sit to/from Stand Sit to Stand: Supervision           General transfer comment: Pt able to transfer STS from toilet without assistance    Ambulation/Gait Ambulation/Gait assistance: Contact guard assist, Supervision Gait Distance (Feet): 10 Feet Assistive device: Rolling walker (2 wheels) Gait Pattern/deviations: Decreased step length - left, Decreased step length - right, Decreased stride length Gait velocity: decreased        Stairs            Wheelchair Mobility     Tilt Bed    Modified Rankin (Stroke Patients Only)       Balance Overall balance assessment: Needs assistance Sitting-balance support: Feet supported Sitting balance-Leahy Scale: Good     Standing balance support: During functional activity, Bilateral upper extremity supported Standing balance-Leahy Scale: Fair Standing balance comment: hand hygiene standing at sink, pt leaning on sink with BUE                             Pertinent Vitals/Pain Pain Assessment Pain Assessment: Faces Faces Pain Scale: Hurts little more Pain Location: mid chest Pain Descriptors / Indicators:  (heartburn)  Pain Intervention(s):  (nurse in room & aware)    Home Living Family/patient expects to be discharged to:: Private residence Living Arrangements: Other relatives (cousin) Available Help at Discharge: Family;Available PRN/intermittently;Friend(s) Type of Home: House Home Access: Level entry     Alternate Level Stairs-Number of Steps: flight (13) Home Layout:  Two level;1/2 bath on main level;Bed/bath upstairs        Prior Function Prior Level of Function : Working/employed;Driving             Mobility Comments: Independent, works at [employer], notes 1 fall in the past 6 months 2/2 HR & falling, driving.       Extremity/Trunk Assessment   Upper Extremity Assessment Upper Extremity Assessment: Overall WFL for tasks assessed    Lower Extremity Assessment Lower Extremity Assessment: Generalized weakness    Cervical / Trunk Assessment Cervical / Trunk Assessment: Normal  Communication   Communication Communication: No apparent difficulties  Cognition Arousal: Alert Behavior During Therapy: WFL for tasks assessed/performed Overall Cognitive Status: Within Functional Limits for tasks assessed (need to assess further)                                          General Comments      Exercises     Assessment/Plan    PT Assessment Patient needs continued PT services  PT Problem List Decreased strength;Decreased activity tolerance;Decreased balance;Decreased mobility;Decreased knowledge of use of DME       PT Treatment Interventions Balance training;DME instruction;Modalities;Gait training;Neuromuscular re-education;Stair training;Functional mobility training;Therapeutic activities;Therapeutic exercise;Manual techniques;Patient/family education    PT Goals (Current goals can be found in the Care Plan section)  Acute Rehab PT Goals Patient Stated Goal: get better PT Goal Formulation: With patient Time For Goal Achievement: 12/30/23 Potential to Achieve Goals: Good    Frequency Min 1X/week     Co-evaluation               AM-PAC PT 6 Clicks Mobility  Outcome Measure Help needed turning from your back to your side while in a flat bed without using bedrails?: None Help needed moving from lying on your back to sitting on the side of a flat bed without using bedrails?: A Little Help  needed moving to and from a bed to a chair (including a wheelchair)?: A Little Help needed standing up from a chair using your arms (e.g., wheelchair or bedside chair)?: A Little Help needed to walk in hospital room?: A Little Help needed climbing 3-5 steps with a railing? : A Lot 6 Click Score: 18    End of Session   Activity Tolerance: Treatment limited secondary to medical complications (Comment) Patient left: in bed;with nursing/sitter in room Nurse Communication: Mobility status PT Visit Diagnosis: Muscle weakness (generalized) (M62.81);Other abnormalities of gait and mobility (R26.89)    Time: 8485-8476 PT Time Calculation (min) (ACUTE ONLY): 9 min   Charges:   PT Evaluation $PT Eval Moderate Complexity: 1 Mod   PT General Charges $$ ACUTE PT VISIT: 1 Visit         Richerd Pinal, PT, DPT 12/16/23, 3:32 PM   Richerd CHRISTELLA Pinal 12/16/2023, 3:30 PM

## 2023-12-17 ENCOUNTER — Other Ambulatory Visit: Payer: Self-pay | Admitting: Internal Medicine

## 2023-12-17 ENCOUNTER — Encounter: Payer: Self-pay | Admitting: Gastroenterology

## 2023-12-17 ENCOUNTER — Other Ambulatory Visit (HOSPITAL_COMMUNITY): Payer: Self-pay | Admitting: Cardiology

## 2023-12-17 ENCOUNTER — Encounter: Payer: Self-pay | Admitting: Physician Assistant

## 2023-12-17 DIAGNOSIS — R109 Unspecified abdominal pain: Secondary | ICD-10-CM | POA: Diagnosis not present

## 2023-12-17 DIAGNOSIS — R079 Chest pain, unspecified: Secondary | ICD-10-CM | POA: Diagnosis not present

## 2023-12-17 DIAGNOSIS — R7989 Other specified abnormal findings of blood chemistry: Secondary | ICD-10-CM | POA: Diagnosis not present

## 2023-12-17 DIAGNOSIS — I472 Ventricular tachycardia, unspecified: Secondary | ICD-10-CM | POA: Diagnosis not present

## 2023-12-17 DIAGNOSIS — K861 Other chronic pancreatitis: Secondary | ICD-10-CM | POA: Diagnosis not present

## 2023-12-17 DIAGNOSIS — K8689 Other specified diseases of pancreas: Secondary | ICD-10-CM | POA: Diagnosis not present

## 2023-12-17 LAB — CBC
HCT: 34.3 % — ABNORMAL LOW (ref 39.0–52.0)
Hemoglobin: 11.9 g/dL — ABNORMAL LOW (ref 13.0–17.0)
MCH: 29.8 pg (ref 26.0–34.0)
MCHC: 34.7 g/dL (ref 30.0–36.0)
MCV: 86 fL (ref 80.0–100.0)
Platelets: 205 10*3/uL (ref 150–400)
RBC: 3.99 MIL/uL — ABNORMAL LOW (ref 4.22–5.81)
RDW: 17.6 % — ABNORMAL HIGH (ref 11.5–15.5)
WBC: 14.8 10*3/uL — ABNORMAL HIGH (ref 4.0–10.5)
nRBC: 0 % (ref 0.0–0.2)

## 2023-12-17 LAB — COMPREHENSIVE METABOLIC PANEL
ALT: 201 U/L — ABNORMAL HIGH (ref 0–44)
AST: 381 U/L — ABNORMAL HIGH (ref 15–41)
Albumin: 2 g/dL — ABNORMAL LOW (ref 3.5–5.0)
Alkaline Phosphatase: 135 U/L — ABNORMAL HIGH (ref 38–126)
Anion gap: 14 (ref 5–15)
BUN: 32 mg/dL — ABNORMAL HIGH (ref 6–20)
CO2: 24 mmol/L (ref 22–32)
Calcium: 9.4 mg/dL (ref 8.9–10.3)
Chloride: 94 mmol/L — ABNORMAL LOW (ref 98–111)
Creatinine, Ser: 9.42 mg/dL — ABNORMAL HIGH (ref 0.61–1.24)
GFR, Estimated: 6 mL/min — ABNORMAL LOW (ref >60)
Glucose, Bld: 85 mg/dL (ref 70–99)
Potassium: 4.3 mmol/L (ref 3.5–5.1)
Sodium: 132 mmol/L — ABNORMAL LOW (ref 135–145)
Total Bilirubin: 1.1 mg/dL (ref 0.0–1.2)
Total Protein: 6.1 g/dL — ABNORMAL LOW (ref 6.5–8.1)

## 2023-12-17 LAB — TROPONIN I (HIGH SENSITIVITY)
Troponin I (High Sensitivity): 381 ng/L (ref <18)
Troponin I (High Sensitivity): 361 ng/L (ref <18)

## 2023-12-17 LAB — HEPARIN LEVEL (UNFRACTIONATED): Heparin Unfractionated: 0.34 [IU]/mL (ref 0.30–0.70)

## 2023-12-17 LAB — LIPASE, BLOOD: Lipase: 116 U/L — ABNORMAL HIGH (ref 11–51)

## 2023-12-17 MED ORDER — NEPRO/CARBSTEADY PO LIQD: 237.0000 mL | ORAL | Status: DC | PRN

## 2023-12-17 MED ORDER — METOPROLOL SUCCINATE ER 50 MG PO TB24
100.0000 mg | ORAL_TABLET | Freq: Every day | ORAL | Status: DC
  Administered 2023-12-17 – 2023-12-18 (×2): 100 mg via ORAL
  Filled 2023-12-17 (×2): qty 1
  Filled 2023-12-17 (×2): qty 2
  Filled 2023-12-17 (×2): qty 1

## 2023-12-17 MED ORDER — ALTEPLASE 2 MG IJ SOLR: 2.0000 mg | Freq: Once | INTRAMUSCULAR | Status: DC | PRN

## 2023-12-17 MED ORDER — NITROGLYCERIN 0.4 MG SL SUBL: 0.4000 mg | SUBLINGUAL_TABLET | SUBLINGUAL | Status: DC | PRN

## 2023-12-17 MED ORDER — ANTICOAGULANT SODIUM CITRATE 4% (200MG/5ML) IV SOLN: 5.0000 mL | Status: DC | PRN

## 2023-12-17 NOTE — Consult Note (Signed)
 Cardiology Consultation   Patient ID: SACHIN FERENCZ MRN: [id number]; DOB: 1961/08/30  Admit date: 12/10/2023 Date of Consult: 12/17/2023  PCP:  [name], MD   [facility] Providers Cardiologist:  Ezra Shuck, MD  Electrophysiologist:  Danelle Birmingham, MD       Patient Profile:   Victor Thompson is a 63 y.o. male with a hx of  Coronary artery disease, nonobstructive  Hx of PCI in 2005 Cardiac catheterization 08/2022: oLAD 40, D1 60, mLCx 30, pRCA 30 (HFrEF) heart failure with reduced ejection fraction  Ischemic CM ?Cardiac sarcoid  Cardiac PET ([facility]) not done (insurance denied) High Res CT in 09/2022 w no pulmonary sarcoid findings Started on empiric prednisone   ACE level elevated in 10/2023 >> PET pending (scheduled 12/21/23) PYP neg; unable to get MRI (artifact from [facility] ICD) TTE 08/2022: EF 20-25 TTE 12/11/23: EF 30-35, global HK, Gr 1 DD, NL RVSF, mild MR Ventricular Tachycardia S/p VT arrest in 2015 S/p [place] ICD VT storm in 05/2022, s/p shock VT storm 08/2022 >> required intubation, multiple Amio boluses; procainamide  >> mexiletine  EP: recommended no VT ablation (high risk, not likely to be successful) Paroxysmal atrial fibrillation  Eliquis   Left Bundle Branch Block  DNR (Do Not Resuscitate)  ICD deactivated in 08/2022 ESRD OSA  Hypertension  Hyperlipidemia   who is being seen 12/17/2023 for the evaluation of recurrent VT at the request of Dr. Cindy.  History of Present Illness:   Victor Thompson was last seen by Dr. Shuck in CHF clinic in Nov. He has been set up for Cardiac PET to evaluated for cardiac sarcoid. This is scheduled for 12/21/23.  He was admitted to [facility] 12/10/23 with epigastric pain, shortness of breath, cough and chest pain. CT was neg for PE but revealed pancreatic mass concerning for malignancy. He is s/p EUS guided bx. Cytology neg for malignancy; findings c/w chronic pancreatitis. He developed post ERCP pancreatitis.  Echocardiogram  this admit with EF 30-35.   EKG demonstrates NSR, Left Bundle Branch Block, QRS 190. Labs: Na 132, K 4.3, SCr 9.42, Alb 2, Magnesium  1.9, hsT 381, Hgb 11.9  He has been having episodes of VT on tele. Cardiology is asked to manage VT. He is resting in bed currently. He notes occasional chest burning. He does feel his heart racing with these episodes. He had a questionable syncopal episode the other day.   Past Medical History:  Diagnosis Date   Acute on chronic systolic CHF (congestive heart failure) (HCC) 06/14/2020   Acute respiratory failure (HCC) 05/28/2022   AICD (automatic cardioverter/defibrillator) present    2015   Allergy     Asthma    no meds   Cardiac arrest (HCC) 2015   Chest pain 05/25/2022   CHF (congestive heart failure) (HCC)    Coronary artery disease    ESRD on hemodialysis (HCC)    ckd -stage 5   Hyperlipidemia    Hypertension    Myocardial infarction (HCC)    Obesity    Pneumonia    Shortness of breath dyspnea    Sleep apnea    USES CPAP   Wears glasses     Past Surgical History:  Procedure Laterality Date   AV FISTULA PLACEMENT Right 03/15/2019   Procedure: RIGHT ARM ARTERIOVENOUS (AV) FISTULA CREATION;  Surgeon: Eliza Lonni RAMAN, MD;  Location: [facility] OR;  Service: Vascular;  Laterality: Right;   BIOPSY  12/14/2023   Procedure: BIOPSY;  Surgeon: Wilhelmenia Aloha Raddle., MD;  Location: [facility] ENDOSCOPY;  Service: Gastroenterology;;   COLONOSCOPY     COLONOSCOPY W/ BIOPSIES AND POLYPECTOMY     COLONOSCOPY WITH PROPOFOL  N/A 09/28/2023   Procedure: COLONOSCOPY WITH PROPOFOL ;  Surgeon: Leigh Elspeth SQUIBB, MD;  Location: [facility] ENDOSCOPY;  Service: Gastroenterology;  Laterality: N/A;   CORONARY STENT PLACEMENT  2007   ESOPHAGOGASTRODUODENOSCOPY (EGD) WITH PROPOFOL  N/A 12/14/2023   Procedure: ESOPHAGOGASTRODUODENOSCOPY (EGD) WITH PROPOFOL ;  Surgeon: Wilhelmenia Aloha Raddle., MD;  Location: [facility] ENDOSCOPY;  Service: Gastroenterology;  Laterality: N/A;   FINE NEEDLE  ASPIRATION  12/14/2023   Procedure: FINE NEEDLE ASPIRATION (FNA) LINEAR;  Surgeon: Wilhelmenia Aloha Raddle., MD;  Location: [facility] ENDOSCOPY;  Service: Gastroenterology;;   HEMOSTASIS CLIP PLACEMENT  12/14/2023   Procedure: HEMOSTASIS CLIP PLACEMENT;  Surgeon: Wilhelmenia Aloha Raddle., MD;  Location: [facility] ENDOSCOPY;  Service: Gastroenterology;;   IMPLANTABLE CARDIOVERTER DEFIBRILLATOR IMPLANT  2015   POLYPECTOMY  09/28/2023   Procedure: POLYPECTOMY;  Surgeon: Leigh Elspeth SQUIBB, MD;  Location: [facility] ENDOSCOPY;  Service: Gastroenterology;;   POLYPECTOMY  12/14/2023   Procedure: POLYPECTOMY;  Surgeon: Wilhelmenia Aloha Raddle., MD;  Location: [facility] ENDOSCOPY;  Service: Gastroenterology;;   RIGHT/LEFT HEART CATH AND CORONARY ANGIOGRAPHY N/A 07/08/2021   Procedure: RIGHT/LEFT HEART CATH AND CORONARY ANGIOGRAPHY;  Surgeon: Rolan Ezra RAMAN, MD;  Location: [facility] INVASIVE CV LAB;  Service: Cardiovascular;  Laterality: N/A;   RIGHT/LEFT HEART CATH AND CORONARY ANGIOGRAPHY N/A 08/25/2022   Procedure: RIGHT/LEFT HEART CATH AND CORONARY ANGIOGRAPHY;  Surgeon: Rolan Ezra RAMAN, MD;  Location: [facility] INVASIVE CV LAB;  Service: Cardiovascular;  Laterality: N/A;   SUBQ ICD CHANGEOUT N/A 10/13/2020   Procedure: SUBQ ICD CHANGEOUT;  Surgeon: Waddell Danelle ORN, MD;  Location: [facility] INVASIVE CV LAB;  Service: Cardiovascular;  Laterality: N/A;   UPPER ESOPHAGEAL ENDOSCOPIC ULTRASOUND (EUS) N/A 12/14/2023   Procedure: UPPER ESOPHAGEAL ENDOSCOPIC ULTRASOUND (EUS);  Surgeon: Wilhelmenia Aloha Raddle., MD;  Location: [facility] ENDOSCOPY;  Service: Gastroenterology;  Laterality: N/A;       Inpatient Medications: Scheduled Meds:  amiodarone   200 mg Oral BID   atorvastatin   80 mg Oral QHS   carvedilol   12.5 mg Oral BID   Chlorhexidine  Gluconate Cloth  6 each Topical Q0600   Chlorhexidine  Gluconate Cloth  6 each Topical Q0600   Chlorhexidine  Gluconate Cloth  6 each Topical Q0600   docusate sodium   100 mg Oral BID   fluticasone  furoate-vilanterol  1 puff  Inhalation Daily   hydrOXYzine   25 mg Oral TID   lanthanum   2,000 mg Oral TID WC   And   lanthanum   1,000 mg Oral BID BM   mexiletine  300 mg Oral BID   pantoprazole   40 mg Oral BID   polyethylene glycol  17 g Oral BID   sodium chloride  flush  3 mL Intravenous Q12H   Continuous Infusions:  anticoagulant sodium citrate      heparin  700 Units/hr (12/16/23 1528)   PRN Meds: acetaminophen  **OR** acetaminophen , alteplase , anticoagulant sodium citrate , bisacodyl , calcium  carbonate, feeding supplement (NEPRO CARB STEADY), levalbuterol , nitroGLYCERIN , ondansetron  **OR** ondansetron  (ZOFRAN ) IV, senna, simethicone , sodium chloride  flush  Allergies:    Allergies  Allergen Reactions   Ace Inhibitors Swelling    Swelling of the tongue   Influenza Vaccines Hives and Swelling    SWELLING REACTION UNSPECIFIED    Ketorolac Swelling    SWELLING REACTION UNSPECIFIED    Lidocaine  Swelling    TONGUE SWELLS   Lisinopril Swelling    TONGUE SWELLING Pt reported problem with a BP med which sounded like  lisinopril  But as of 09/19/06,pt had tolerated altace without problem   Penicillins Swelling    TONGUE SWELLS Has patient had a PCN reaction causing immediate rash, facial/tongue/throat swelling, SOB or lightheadedness with hypotension: Yes Has patient had a PCN reaction causing severe rash involving mucus membranes or skin necrosis: No Has patient had a PCN reaction that required hospitalization No Has patient had a PCN reaction occurring within the last 10 years: Yes If all of the above answers are NO, then may proceed with Cephalosporin use.     Social History:   Social History   Socioeconomic History   Marital status: Single    Spouse name: Not on file   Number of children: 3   Years of education: Not on file   Highest education level: Not on file  Occupational History   Occupation: retired  Tobacco Use   Smoking status: Never   Smokeless tobacco: Never  Vaping Use   Vaping  status: Never Used  Substance and Sexual Activity   Alcohol use: No   Drug use: No   Sexual activity: Yes  Other Topics Concern   Not on file  Social History Narrative   ** Merged History Encounter **       Social Drivers of Health   Financial Resource Strain: Low Risk  (12/09/2023)   Overall Financial Resource Strain (CARDIA)    Difficulty of Paying Living Expenses: Not hard at all  Food Insecurity: No Food Insecurity (12/11/2023)   Hunger Vital Sign    Worried About Running Out of Food in the Last Year: Never true    Ran Out of Food in the Last Year: Never true  Transportation Needs: No Transportation Needs (12/11/2023)   PRAPARE - Administrator, Civil Service (Medical): No    Lack of Transportation (Non-Medical): No  Physical Activity: Inactive (02/17/2023)   Exercise Vital Sign    Days of Exercise per Week: 0 days    Minutes of Exercise per Session: 0 min  Stress: No Stress Concern Present (02/17/2023)   [employer] of Occupational Health - Occupational Stress Questionnaire    Feeling of Stress : Only a little  Social Connections: Not on file  Intimate Partner Violence: Not At Risk (12/11/2023)   Humiliation, Afraid, Rape, and Kick questionnaire    Fear of Current or Ex-Partner: No    Emotionally Abused: No    Physically Abused: No    Sexually Abused: No    Family History:    Family History  Problem Relation Age of Onset   Hypertension Mother    Liver disease Father    Colon cancer Neg Hx    Esophageal cancer Neg Hx    Rectal cancer Neg Hx    Stomach cancer Neg Hx    Colon polyps Neg Hx      ROS:  Please see the history of present illness.  All other ROS reviewed and negative.     Physical Exam/Data:   Vitals:   12/17/23 0755 12/17/23 1158 12/17/23 1309 12/17/23 1416  BP:  (!) 90/58 (!) 96/55 (!) 101/59  Pulse: 78 69 69 68  Resp:  18  (!) 25  Temp:    98.7 F (37.1 C)  TempSrc:      SpO2: 95%   90%  Weight:    97.4 kg  Height:         Intake/Output Summary (Last 24 hours) at 12/17/2023 1444 Last data filed at 12/17/2023 0910 Gross per 24  hour  Intake 60 ml  Output --  Net 60 ml      12/17/2023    2:16 PM 12/17/2023    4:18 AM 12/16/2023    5:00 AM  Last 3 Weights  Weight (lbs) 214 lb 11.7 oz 228 lb 6.3 oz 227 lb 11.8 oz  Weight (kg) 97.4 kg 103.6 kg 103.3 kg     Body mass index is 33.63 kg/m.  General:  Well nourished, well developed, in no acute distress HEENT: normal Neck: no JVD Cardiac:  normal S1, S2; RRR; no murmur  Lungs:  clear to auscultation bilaterally, no wheezing, rhonchi or rales  Abd: soft  Ext: no edema Musculoskeletal:  No deformities Skin: warm and dry  Neuro:  CNs 2-12 intact, no focal abnormalities noted Psych:  Normal affect   EKG:  The EKG was personally reviewed and demonstrates:  see HPI Telemetry:  Telemetry was personally reviewed and demonstrates:  VT and NSVT  Laboratory Data:  High Sensitivity Troponin:   Recent Labs  Lab 12/10/23 2329 12/11/23 0216 12/11/23 0400 12/17/23 0929 12/17/23 1135  TROPONINIHS 50* 47* 47* 381* 361*     Chemistry Recent Labs  Lab 12/14/23 0653 12/15/23 0656 12/16/23 0759 12/16/23 1010 12/17/23 0706  NA 134* 133* 134*  --  132*  K 4.0 4.6 4.5  --  4.3  CL 93* 96* 95*  --  94*  CO2 25 24 24   --  24  GLUCOSE 79 73 79  --  85  BUN 13 19 17   --  32*  CREATININE 7.01* 8.82* 7.66*  --  9.42*  CALCIUM  9.3 9.5 9.4  --  9.4  MG 2.0  --   --  1.9  --   GFRNONAA 8* 6* 7*  --  6*  ANIONGAP 16* 13 15  --  14    Recent Labs  Lab 12/15/23 0656 12/16/23 0759 12/17/23 0706  PROT 6.3* 6.2* 6.1*  ALBUMIN  2.2* 2.1* 2.0*  AST 129* 187* 381*  ALT 141* 142* 201*  ALKPHOS 109 111 135*  BILITOT 1.1 1.0 1.1   Lipids No results for input(s): CHOL, TRIG, HDL, LABVLDL, LDLCALC, CHOLHDL in the last 168 hours.  Hematology Recent Labs  Lab 12/15/23 0656 12/16/23 0759 12/17/23 0706  WBC 6.1 12.9* 14.8*  RBC 4.13* 4.21* 3.99*   HGB 12.1* 12.5* 11.9*  HCT 36.1* 37.0* 34.3*  MCV 87.4 87.9 86.0  MCH 29.3 29.7 29.8  MCHC 33.5 33.8 34.7  RDW 17.4* 17.5* 17.6*  PLT 214 191 205      Assessment and Plan:   1. Ventricular Tachycardia He has a hx of recurrent VT, VT storm that has been difficult to manage. He is currently on Amiodarone  and Mexiletine. His device was deactivated in 2023 after recurrent shocks. He was listed as DNR (Do Not Resuscitate) then but is full code here. We discussed with him whether he would want his device turned back on. Benefits of ATP vs risks of recurrent shock discussed. The patient does not want to be shocked. Therefore, ICD will not be turned back on. Management limited. He is pretty large doses of Amiodarone  and Mexiletine. Will try to advance beta-blocker further to see if this can mitigate VT further.  - Continue Amiodarone  200 mg twice daily  - Mexiletine 300 mg twice daily  - DC Coreg  - Start Metoprolol  succinate 100 mg this PM and titrate up as he can tolerate  2. (HFrEF) heart failure with reduced ejection fraction EF  30-35. Volume managed by dialysis. Change Coreg  to Metoprolol  succinate as noted.   3. Cardiomyopathy Hx of ischemic CM. PYP neg in the past. MRI could not be done b/c of [place] ICD. There is a question of sarcoid. Sarcoid workup is pending. PET is scheduled next week. If he is going to be here for several more days, would try to get PET done on the day it is scheduled. Scar from Sarcoid could be reason he is having more VT. If PET c/w Sarcoid, could initiate prednisone  prior to DC.   4. Coronary artery disease  Nonobstructive by cath in 2023. Hx of remote PCI. Elevated hsT likely demand ischemia in setting of VT, ESRD. Continue Atorvastatin  80 mg once daily, Coreg  12.5 mg twice daily. He has noted some chest pain that sounds noncardiac. No plans for ischemic testing at this time.   4. Paroxysmal atrial fibrillation  Maintaining NSR. On Eliquis  at home. Currently on IV  Heparin .   5. Pancreatic Mass Per IM, GI.  6. ESRD Per nephrology  Risk Assessment/Risk Scores:        CHA2DS2-VASc Score = 3   This indicates a 3.2% annual risk of stroke. The patient's score is based upon: CHF History: 1 HTN History: 1 Diabetes History: 0 Stroke History: 0 Vascular Disease History: 1 Age Score: 0 Gender Score: 0       For questions or updates, please contact [facility] Please consult [email or web address] for contact info under   Signed, Glendia Ferrier, PA-C  12/17/2023 2:44 PM

## 2023-12-17 NOTE — Progress Notes (Signed)
 OT Cancellation Note  Patient Details Name: Victor Thompson MRN: [id number] DOB: 1961/02/23   Cancelled Treatment:    Reason Eval/Treat Not Completed: Patient at procedure or test/ unavailable (HD)  Donia Pounds 12/17/2023, 2:21 PM

## 2023-12-17 NOTE — Progress Notes (Signed)
 Progress Note   Patient: Victor Thompson FMW:[id number] DOB: 31-Dec-1960 DOA: 12/10/2023     7 DOS: the patient was seen and examined on 12/17/2023   Brief hospital course: 63 year old African-American male, with past medical history significant for ESRD on HD TTS schedule, ischemic cardiomyopathy, congestive heart failure with reduced EF (25 to 30%), history of CAD, VT storm status post ICD, currently on Eliquis  and amiodarone , reactive airway disease and hyperlipidemia presented with epigastric pain, shortness of breath, cough and chest pain.  Further workup in the ED showed pancreatic mass concerning for malignancy.  Patient was transferred to [facility] as patient will likely need endoscopic ultrasound guided biopsy of pancreatic mass.  GI is following him.   12/14/2023:  EUS guided biopsy of the pancreatic mass planned for today.  On heparin  drip.  Nephrology on board for Hemodialysis needs.  Assessment and Plan: Pancreatic Mass-  GI follow up appreciated. Pt now s/p EUS on 1/8 Follow cytology. Heparin  drip to be restarted post procedure as instructed. LFT, Lipase levels trending up today, see below Continue pain control, antiemetics PRN. Per GI, hold DOAC x for 48hrs after procedure, currently on heparin  gtt -Per GI, tissue appears not malignant and is more consistent with chronic pancreatitis   Essential Hypertension BP stable   Combined systolic and diastolic CHF: Continued Coreg , bidil . No evidence of acute CHF exacerbation.   H/o ventricular tachycardia s/p ICD. Continue Amiodarone , coreg , mexiletine Continues to have runs of VT here, asymptomatic Per records, pt had elected to disable ICD in the past Given hx of difficult to manage VT, have consulted Cardiology for assistance  Elevated trop -Trop over 300 today in the setting of VT -Pt is continued on heparin  gtt -F/u with Cardiology recs   Reactive airway disease- COVID, flu and RSV negative.   Chest x-ray  unremarkable.  CTA chest unremarkable. Continue with Breo Ellipta  twice daily and Xopenex  every 6 hour as needed for any wheezing shortness of breath   ESRD on dialysis TTS schedule Last dialysis 12/12/22 with 3.5L removed. No sob. Nephrology on board for HD needs.   Obesity, Class II BMI 36.64 Diet, exercise and weight reduction advised. Continue CPAP at night for OSA.  Constipation -Continue with scheduled cathartics -two bowel movements reported overnight  Post-ercp pancreatitis -Lipase is down to 116 -Per GI, plan to advance diet if he can tolerate. If pt is unable to tolerate diet, then consider nutritional support with NG and tube feed   Subjective: Some abd fullness this AM. When seen, pt denied chest pain. However, later in the day, pt did report some chest pressure  Physical Exam: Vitals:   12/17/23 1416 12/17/23 1430 12/17/23 1501 12/17/23 1530  BP: (!) 101/59 (!) 101/59 98/60 100/61  Pulse: 68 67 68 69  Resp: (!) 25 (!) 22 (!) 25 19  Temp: 98.7 F (37.1 C)     TempSrc:      SpO2: 90% 92% 94% 94%  Weight: 97.4 kg     Height:       General exam: Conversant, in no acute distress Respiratory system: normal chest rise, clear, no audible wheezing Cardiovascular system: regular rhythm, s1-s2 Gastrointestinal system: Nondistended, nontender, pos BS Central nervous system: No seizures, no tremors Extremities: No cyanosis, no joint deformities Skin: No rashes, no pallor Psychiatry: Affect normal // no auditory hallucinations   Data Reviewed:  Labs reviewed: Na 132, k 4.3, Cr 9.42, Trop 381, WBC 14.8, hgb 11.9, Plts 205   Family Communication:  Pt in room, family at bedside  Disposition: Status is: Inpatient Remains inpatient appropriate because: severity of illness  Planned Discharge Destination: Rehab    Author: Garnette Pelt, MD 12/17/2023 3:41 PM  For on call review [email or web address].

## 2023-12-17 NOTE — Procedures (Addendum)
 HD Note:  Some information was entered later than the data was gathered due to patient care needs. The stated time with the data is accurate.  Received patient in bed to unit.   Alert and oriented.   Informed consent signed and in chart.   Access used: Right upper arm fistula Access issues: None  Patient began to have scrotal tightness during treatment.  100 ml NS given with moderate results.  Patient stated it did not go away completely.  TX duration: 2.25  Alert, without acute distress.  The patient received his BP medication this morning for concerning high BP readings.  Dr. Geralynn was notified. Verbal orders received to keep patient even with fluids and/or give some fluids to the patient to allow him to get a full treatment of dialysis.   Patient BP after treatment was 87/52, prior to removing the needles.  Patient bed put back into trendelenburg position.  Patient BP did not respond post rinse back.  Dr. Geralynn notified and new order to give 250 ml NS bolus to patient prior to the removal of the needles. No fluid was removed from the patient, 550 ml of NS was delivered during treatment.  Patient stable, hand-off given to patient's nurse.   Transported back to the room   Tanuj Mullens L. Lenon, RN [facility].

## 2023-12-17 NOTE — Progress Notes (Signed)
 PHARMACY - ANTICOAGULATION CONSULT NOTE  Pharmacy Consult for Heparin  (Eliquis  on hold) Indication: atrial fibrillation  Allergies  Allergen Reactions   Ace Inhibitors Swelling    Swelling of the tongue   Influenza Vaccines Hives and Swelling    SWELLING REACTION UNSPECIFIED    Ketorolac Swelling    SWELLING REACTION UNSPECIFIED    Lidocaine  Swelling    TONGUE SWELLS   Lisinopril Swelling    TONGUE SWELLING Pt reported problem with a BP med which sounded like  lisinopril  But as of 09/19/06,pt had tolerated altace without problem   Penicillins Swelling    TONGUE SWELLS Has patient had a PCN reaction causing immediate rash, facial/tongue/throat swelling, SOB or lightheadedness with hypotension: Yes Has patient had a PCN reaction causing severe rash involving mucus membranes or skin necrosis: No Has patient had a PCN reaction that required hospitalization No Has patient had a PCN reaction occurring within the last 10 years: Yes If all of the above answers are NO, then may proceed with Cephalosporin use.     Patient Measurements: Height: 5' 7 (170.2 cm) Weight: 103.6 kg (228 lb 6.3 oz) IBW/kg (Calculated) : 66.1 Heparin  Dosing Weight: 89 kg  Vital Signs: Temp: 99.2 F (37.3 C) (01/11 0754) Temp Source: Axillary (01/11 0754) BP: 101/66 (01/11 0754) Pulse Rate: 78 (01/11 0755)  Labs: Recent Labs    12/15/23 0656 12/15/23 0845 12/16/23 0759 12/17/23 0706  HGB 12.1*  --  12.5* 11.9*  HCT 36.1*  --  37.0* 34.3*  PLT 214  --  191 205  APTT  --  37*  --   --   HEPARINUNFRC  --  0.54 0.54 0.34  CREATININE 8.82*  --  7.66* 9.42*    Estimated Creatinine Clearance: 9.6 mL/min (A) (by C-G formula based on SCr of 9.42 mg/dL (H)).  Assessment: 63 yr old male on Eliquis  5 mg BID prior to admission for hx atrial fibrillation.  Eliquis  now held for EUS with FN biopsy of pancreatic head mass. Pharmacy consulted to restart  IV heparin  bridge at midnight 1/9 post  procedure  Eliquis  to not restart until at least 48 hours post procedure completed on 1/8  Heparin  level this morning therapeutic at 0.34 on 700 units/hr. No signs of bleeding noted.  Goal of Therapy:  Heparin  level 0.3-0.7 Monitor platelets by anticoagulation protocol: Yes   Plan:  Continue heparin  at 700 units / hr Heparin  level and CBC daily. Follow up plans to resume Eliquis   Thank you for involving pharmacy in the patient's care.   Hubert Ruths, PharmD PGY1 Acute Care Pharmacy Resident  12/17/2023 8:15 AM

## 2023-12-17 NOTE — Plan of Care (Signed)

## 2023-12-17 NOTE — Progress Notes (Addendum)
 Progress Note   LOS: 7 days   Chief Complaint: pancreatic head mass    Subjective   Patient lying in bed with CPAP on during my evaluation.  He states he is feeling okay today and pain remains improved.  Tolerating diet without difficulty.   Objective   Vital signs in last 24 hours: Temp:  [98.2 F (36.8 C)-99.7 F (37.6 C)] 99.2 F (37.3 C) (01/11 0754) Pulse Rate:  [52-82] 69 (01/11 1158) Resp:  [16-19] 18 (01/11 1158) BP: (90-105)/(58-71) 90/58 (01/11 1158) SpO2:  [90 %-97 %] 95 % (01/11 0755) FiO2 (%):  [21 %] 21 % (01/10 2136) Weight:  [103.6 kg] 103.6 kg (01/11 0418) Last BM Date : 12/16/23 Last BM recorded by nurses in past 5 days No data recorded  General:   male in no acute distress  Heart:  Regular rate and rhythm; no murmurs Pulm: Clear anteriorly; no wheezing Abdomen: soft, nondistended, normal bowel sounds in all quadrants. Nontender without guarding. No organomegaly appreciated. Extremities:  No edema Neurologic:  Alert and  oriented x4;  No focal deficits.  Psych:  Cooperative. Normal mood and affect.  Intake/Output from previous day: 01/10 0701 - 01/11 0700 In: 300 [P.O.:300] Out: -  Intake/Output this shift: Total I/O In: 60 [P.O.:60] Out: -   Studies/Results: No results found.  Lab Results: Recent Labs    12/15/23 0656 12/16/23 0759 12/17/23 0706  WBC 6.1 12.9* 14.8*  HGB 12.1* 12.5* 11.9*  HCT 36.1* 37.0* 34.3*  PLT 214 191 205   BMET Recent Labs    12/15/23 0656 12/16/23 0759 12/17/23 0706  NA 133* 134* 132*  K 4.6 4.5 4.3  CL 96* 95* 94*  CO2 24 24 24   GLUCOSE 73 79 85  BUN 19 17 32*  CREATININE 8.82* 7.66* 9.42*  CALCIUM  9.5 9.4 9.4   LFT Recent Labs    12/17/23 0706  PROT 6.1*  ALBUMIN  2.0*  AST 381*  ALT 201*  ALKPHOS 135*  BILITOT 1.1   PT/INR No results for input(s): LABPROT, INR in the last 72 hours.   Scheduled Meds:  amiodarone   200 mg Oral BID   atorvastatin   80 mg Oral QHS   carvedilol    12.5 mg Oral BID   Chlorhexidine  Gluconate Cloth  6 each Topical Q0600   Chlorhexidine  Gluconate Cloth  6 each Topical Q0600   Chlorhexidine  Gluconate Cloth  6 each Topical Q0600   docusate sodium   100 mg Oral BID   fluticasone  furoate-vilanterol  1 puff Inhalation Daily   hydrOXYzine   25 mg Oral TID   lanthanum   2,000 mg Oral TID WC   And   lanthanum   1,000 mg Oral BID BM   mexiletine  300 mg Oral BID   pantoprazole   40 mg Oral BID   polyethylene glycol  17 g Oral BID   sodium chloride  flush  3 mL Intravenous Q12H   Continuous Infusions:  heparin  700 Units/hr (12/16/23 1528)      Patient profile:   63 year old male with history of CHF status post AICD, CAD, end-stage renal disease on dialysis, sleep apnea presents with epigastric discomfort shortness of breath and chest pain. CT abdomen pelvis shows large heterogeneously hypodense mass arising from pancreatic head also inseparable from descending duodenum measuring 5 x 3.8 x 4.7 cm concerning for malignancy.  Enlarged gastrohepatic ligament and celiac axis lymph nodes numerous prominent retroperitoneal lymph nodes, hypodense hepatic lesion adjacent to the falciform ligament generally characteristic and location appearance for  focal fatty deposition but could be hepatic metastasis       Impression:   Pancreatic head mass with lymph node metastatic disease and elevated LFTs concerning for primary malignancy  EGD/EUS 1/8 showed white nummular lesions in esophageal mucosa, gastritis, prominent major and minor papilla, single duodenal polyp.  Masslike region in the superior pancreatic head.  Cytology results pending.  EUS suspicious for undifferentiated malignancy.  Dilation of the gallbladder measuring up to 1 mm.  Pathology shows changes compatible with chronic pancreatitis. Negative for malignancy   The cell block contains predominant benign pancreatic parenchyma with chronic pancreatitis, focal fibrosis and islet cell hyperplasia.  Immunohistochemical stain show the islet cells are positive for synaptophysin and chromogranin A. Ki 67 proliferation index is low (<1%). The overall features are not diagnostic of neoplasm or malignancy. However, an unsampled mass lesion or malignancy cannot be excluded. If there is clinical suspicion for a malignancy, re-sampling for more representative materials will be helpful.    Post ERCP pancreatitis ERCP 1/8 now with worsening upper abdominal pain post op and lipase of 501.  Likely post ERCP pancreatitis. Currently resolving abdominal pain today   Normocytic anemia Hgb stable No overt GI bleeding Potentially secondary to CKD/anemia of chronic disease -Continue to trend hemoglobin   CHF EF 30%, history of AICD Echo yesterday EF 30 to 35% grade 1 diastolic mild MR no aortic stenosis   Nonobstructive CAD with elevated troponins .No chest pain currently, most recent troponin 47 and stable   End-stage renal disease  on dialysis 3 times weekly     Plan:   --EUS with FNA negative for malignant cells, possible sampling error.  MRCP ordered for further evaluation and any endoscopic procedures required based on MRCP defer to Dr. Wilhelmenia when he comes on the service. -- continue supportive care  Bayley CHRISTELLA Blower  61/10/2024, 12:20 PM   Attending physician's note   I have taken a history, reviewed the chart and examined the patient. I performed a substantive portion of this encounter, including complete performance of at least one of the key components, in conjunction with the APP. I agree with the APP's note, impression and recommendations.    Discussed with Dr. Wilhelmenia, ordered MRCP to better evaluate pancreas and peripancreatic area.  Tentatively plan for repeat EUS with FNA either Monday or Tuesday next week based on MRCP findings Continue diet as tolerated Supportive care  The patient was provided an opportunity to ask questions and all were answered. The patient  agreed with the plan and demonstrated an understanding of the instructions.   LOIS Wilkie Mcgee , MD [phone]

## 2023-12-17 NOTE — Progress Notes (Signed)
 [facility] Progress Note  Subjective: seen in room. Having post ercp pancreatitis, and IVFs were given yesterday consisting of 1 L NS bolus. Had HD yest w/ 1600 cc off, BP's dropped into the 100s. Pt on RA this am.   Vitals:   12/17/23 1430 12/17/23 1501 12/17/23 1530 12/17/23 1600  BP: (!) 101/59 98/60 100/61 (!) 96/56  Pulse: 67 68 69 71  Resp: (!) 22 (!) 25 19 (!) 26  Temp:      TempSrc:      SpO2: 92% 94% 94% 94%  Weight:      Height:        Exam: Gen alert, no distress No jvd or bruits Chest clear bilat to bases, no rales/ wheezing RRR no MRG Abd soft ntnd no mass or ascites +bs GU defer MS no joint effusions or deformity Ext no LE or UE edema, no wounds or ulcers Neuro is alert, Ox 3 , nf    AVF+bruit         Renal-related home meds: - eliquis  5 bid - coreg  6.25 bid, bidil  20-37.5 bid - fosrenol  2 gm ac tid - others: mexiletine 300 bid, PPI, symbicort , statin, amiodarone       [facility]   4h   450 /1.5  102kg  2/2 bath   AVF   Heparin  3000 - hect 9 micrograms three times per week - no esa   CXR 1/04 on admission - no acute disease   Assessment/ Plan: Pancreatic mass - w/ abd pain, CT +for mass a head of pancreas, also multiple local enlarged LN's. GI consulting. Per pmd / GI.  ESKD - on HD TTS. Had HD here tues/ Thursday. Next HD today.  HTN - BP's soft, have d/w pmd. DC'd bidil  for now. Getting coreg  per cardiology for his arrhythmias.   Volume - CXR negative. 3.5 L off tuesday and 1.6 off Thursday. 4-5kg under dry wt, no edema on exam. Keep even w/ HD today. Let volume come back up a bit so that beta-blocker can be titrated if needed per cardiology.  Anemia of eskd - Hb 11, not on esa at OP unit. Follow.  Secondary hyperparathyroidism - CCa in range. Cont binders w/ meals.  H/o combined heart failure H/o VT storm/ ischemic CM/ PAF - sp ICD and takes mexilitene, BB and amiodarone . Cardiology is following.      Myer Fret  MD  [facility] 12/17/2023, 4:03 PM  Recent Labs  Lab 12/12/23 0051 12/12/23 0551 12/16/23 0759 12/17/23 0706  HGB  --    < > 12.5* 11.9*  ALBUMIN   --    < > 2.1* 2.0*  CALCIUM   --    < > 9.4 9.4  PHOS 3.5  --   --   --   CREATININE  --    < > 7.66* 9.42*  K  --    < > 4.5 4.3   < > = values in this interval not displayed.   Recent Labs  Lab 12/13/23 0816  IRON 61  TIBC 150*  FERRITIN 2,103*   Inpatient medications:  amiodarone   200 mg Oral BID   atorvastatin   80 mg Oral QHS   Chlorhexidine  Gluconate Cloth  6 each Topical Q0600   Chlorhexidine  Gluconate Cloth  6 each Topical Q0600   Chlorhexidine  Gluconate Cloth  6 each Topical Q0600   docusate sodium   100 mg Oral BID   fluticasone  furoate-vilanterol  1 puff Inhalation Daily   hydrOXYzine   25 mg Oral TID   lanthanum   2,000 mg Oral TID WC   And   lanthanum   1,000 mg Oral BID BM   metoprolol  succinate  100 mg Oral Daily   mexiletine  300 mg Oral BID   pantoprazole   40 mg Oral BID   polyethylene glycol  17 g Oral BID   sodium chloride  flush  3 mL Intravenous Q12H    anticoagulant sodium citrate      heparin  700 Units/hr (12/16/23 1528)   acetaminophen  **OR** acetaminophen , alteplase , anticoagulant sodium citrate , bisacodyl , calcium  carbonate, feeding supplement (NEPRO CARB STEADY), levalbuterol , nitroGLYCERIN , ondansetron  **OR** ondansetron  (ZOFRAN ) IV, senna, simethicone , sodium chloride  flush

## 2023-12-17 NOTE — Telephone Encounter (Signed)
 This encounter was created in error - please disregard.

## 2023-12-18 ENCOUNTER — Inpatient Hospital Stay (HOSPITAL_COMMUNITY): Payer: 59

## 2023-12-18 DIAGNOSIS — R109 Unspecified abdominal pain: Secondary | ICD-10-CM | POA: Diagnosis not present

## 2023-12-18 DIAGNOSIS — K8689 Other specified diseases of pancreas: Secondary | ICD-10-CM | POA: Diagnosis not present

## 2023-12-18 DIAGNOSIS — R079 Chest pain, unspecified: Secondary | ICD-10-CM | POA: Diagnosis not present

## 2023-12-18 DIAGNOSIS — I472 Ventricular tachycardia, unspecified: Secondary | ICD-10-CM | POA: Diagnosis not present

## 2023-12-18 LAB — COMPREHENSIVE METABOLIC PANEL
ALT: 288 U/L — ABNORMAL HIGH (ref 0–44)
AST: 595 U/L — ABNORMAL HIGH (ref 15–41)
Albumin: 1.8 g/dL — ABNORMAL LOW (ref 3.5–5.0)
Alkaline Phosphatase: 159 U/L — ABNORMAL HIGH (ref 38–126)
Anion gap: 12 (ref 5–15)
BUN: 26 mg/dL — ABNORMAL HIGH (ref 6–20)
CO2: 27 mmol/L (ref 22–32)
Calcium: 9 mg/dL (ref 8.9–10.3)
Chloride: 95 mmol/L — ABNORMAL LOW (ref 98–111)
Creatinine, Ser: 7.52 mg/dL — ABNORMAL HIGH (ref 0.61–1.24)
GFR, Estimated: 8 mL/min — ABNORMAL LOW (ref >60)
Glucose, Bld: 86 mg/dL (ref 70–99)
Potassium: 4.1 mmol/L (ref 3.5–5.1)
Sodium: 134 mmol/L — ABNORMAL LOW (ref 135–145)
Total Bilirubin: 1.7 mg/dL — ABNORMAL HIGH (ref 0.0–1.2)
Total Protein: 6 g/dL — ABNORMAL LOW (ref 6.5–8.1)

## 2023-12-18 LAB — CBC
HCT: 32.2 % — ABNORMAL LOW (ref 39.0–52.0)
Hemoglobin: 11.1 g/dL — ABNORMAL LOW (ref 13.0–17.0)
MCH: 29.7 pg (ref 26.0–34.0)
MCHC: 34.5 g/dL (ref 30.0–36.0)
MCV: 86.1 fL (ref 80.0–100.0)
Platelets: 181 10*3/uL (ref 150–400)
RBC: 3.74 MIL/uL — ABNORMAL LOW (ref 4.22–5.81)
RDW: 17.6 % — ABNORMAL HIGH (ref 11.5–15.5)
WBC: 11.3 10*3/uL — ABNORMAL HIGH (ref 4.0–10.5)
nRBC: 0 % (ref 0.0–0.2)

## 2023-12-18 LAB — HEPARIN LEVEL (UNFRACTIONATED): Heparin Unfractionated: 0.3 [IU]/mL (ref 0.30–0.70)

## 2023-12-18 MED ORDER — MIDODRINE HCL 5 MG PO TABS: 5.0000 mg | ORAL_TABLET | Freq: Once | ORAL | Status: DC

## 2023-12-18 MED ORDER — MIDODRINE HCL 5 MG PO TABS
5.0000 mg | ORAL_TABLET | Freq: Three times a day (TID) | ORAL | Status: DC | PRN
  Administered 2023-12-18 – 2023-12-23 (×7): 5 mg via ORAL
  Filled 2023-12-18 (×7): qty 1

## 2023-12-18 MED ORDER — ORAL CARE MOUTH RINSE: 15.0000 mL | OROMUCOSAL | Status: DC | PRN

## 2023-12-18 NOTE — Progress Notes (Signed)
 PHARMACY - ANTICOAGULATION CONSULT NOTE  Pharmacy Consult for Heparin  (Eliquis  on hold) Indication: atrial fibrillation  Allergies  Allergen Reactions   Ace Inhibitors Swelling    Swelling of the tongue   Influenza Vaccines Hives and Swelling    SWELLING REACTION UNSPECIFIED    Ketorolac Swelling    SWELLING REACTION UNSPECIFIED    Lidocaine  Swelling    TONGUE SWELLS   Lisinopril Swelling    TONGUE SWELLING Pt reported problem with a BP med which sounded like  lisinopril  But as of 09/19/06,pt had tolerated altace without problem   Penicillins Swelling    TONGUE SWELLS Has patient had a PCN reaction causing immediate rash, facial/tongue/throat swelling, SOB or lightheadedness with hypotension: Yes Has patient had a PCN reaction causing severe rash involving mucus membranes or skin necrosis: No Has patient had a PCN reaction that required hospitalization No Has patient had a PCN reaction occurring within the last 10 years: Yes If all of the above answers are NO, then may proceed with Cephalosporin use.     Patient Measurements: Height: 5' 7 (170.2 cm) Weight: 103.3 kg (227 lb 11.8 oz) IBW/kg (Calculated) : 66.1 Heparin  Dosing Weight: 89 kg  Vital Signs: Temp: 98 F (36.7 C) (01/12 0356) Temp Source: Axillary (01/12 0206) BP: 102/63 (01/12 0356) Pulse Rate: 69 (01/12 0356)  Labs: Recent Labs    12/15/23 0845 12/15/23 0845 12/16/23 0759 12/17/23 0706 12/17/23 0929 12/17/23 1135 12/18/23 0653  HGB  --    < > 12.5* 11.9*  --   --  11.1*  HCT  --   --  37.0* 34.3*  --   --  32.2*  PLT  --   --  191 205  --   --  181  APTT 37*  --   --   --   --   --   --   HEPARINUNFRC 0.54  --  0.54 0.34  --   --  0.30  CREATININE  --   --  7.66* 9.42*  --   --   --   TROPONINIHS  --   --   --   --  381* 361*  --    < > = values in this interval not displayed.    Estimated Creatinine Clearance: 9.6 mL/min (A) (by C-G formula based on SCr of 9.42 mg/dL  (H)).  Assessment: 63 yr old male on Eliquis  5 mg BID prior to admission for hx atrial fibrillation.  Eliquis  now held for EUS with FN biopsy of pancreatic head mass. Pharmacy consulted to restart  IV heparin  bridge at midnight 1/9 post procedure  Eliquis  to not restart until at least 48 hours post procedure completed on 1/8  Heparin  level on lower end of therapeutic today at 0.3 on 700 units/hr. Hgb 11.1, plt wnl. No issues with infusion per RN and no signs of bleeding noted. Per discussion with provider wants to continue heparin  for now with concerns for potential obstructing stone and elevated troponins.   Goal of Therapy:  Heparin  level 0.3-0.7 Monitor platelets by anticoagulation protocol: Yes   Plan:  Increase heparin  slightly to 750 untis/hr to keep in therapeutic range Heparin  level and CBC daily. Follow up plans to resume Eliquis   Thank you for involving pharmacy in the patient's care.   Hubert Ruths, PharmD PGY1 Acute Care Pharmacy Resident  12/18/2023 7:58 AM

## 2023-12-18 NOTE — Progress Notes (Signed)
 RT Note: Patient currently on the CPAP. States he is still napping and wants it on

## 2023-12-18 NOTE — Progress Notes (Signed)
 [facility] Progress Note  Subjective: seen in room.  Abd pains are improved today. No c/os'. BP's soft and no UF w/ HD last night.   Vitals:   12/18/23 0206 12/18/23 0207 12/18/23 0356 12/18/23 0759  BP: (!) 75/55 (!) 81/52 102/63 99/75  Pulse: 99  69 68  Resp: 18  18 16   Temp: 98.2 F (36.8 C)  98 F (36.7 C) 98.4 F (36.9 C)  TempSrc: Axillary     SpO2: 100%  92% 98%  Weight:   103.3 kg   Height:        Exam: Gen alert, no distress No jvd or bruits Chest clear bilat to bases RRR no MRG Abd soft ntnd no mass or ascites +bs Ext no LE edema Neuro is alert, Ox 3 , nf    AVF+bruit         Renal-related home meds: - eliquis  5 bid - coreg  6.25 bid, bidil  20-37.5 bid - fosrenol  2 gm ac tid - others: mexiletine 300 bid, PPI, symbicort , statin, amiodarone       [facility]   4h   450 /1.5  102kg  2/2 bath   AVF   Heparin  3000 - hect 9 micrograms three times per week - no esa   CXR 1/04 on admission - no acute disease   Assessment/ Plan: Pancreatic mass - w/ abd pain, CT +for mass a head of pancreas, also multiple local enlarged LN's. SP EUS on 1/08 w/ biopsy appearing not malignant and more c/w with chronic pancreatitis.  ESKD - on HD TTS. Next HD 1/14.  HTN - BP's soft. DC'd bidil  for now. Getting coreg  per cardiology for his arrhythmias.   Volume - CXR negative. 3.5 L off tuesday and 1.6 off Thursday. 4-5kg under dry wt, no edema on exam. No UF w/ HD Sat. Will let wt's come up a bit. Bed weights are highly inaccurate due to the numerous blankets he has on most likely. Standing wt this am was 96.5kg, well under dry wt.  Anemia of eskd - Hb 11, not on esa at OP unit. Follow.  Secondary hyperparathyroidism - CCa in range. Cont binders w/ meals.  H/o combined heart failure H/o VT storm/ ischemic CM/ PAF - sp ICD and takes mexilitene, BB and amiodarone . Cardiology is following.      Myer Fret MD  [facility] 12/18/2023, 11:08 AM  Recent Labs  Lab  12/12/23 0051 12/12/23 0551 12/17/23 0706 12/18/23 0653  HGB  --    < > 11.9* 11.1*  ALBUMIN   --    < > 2.0* 1.8*  CALCIUM   --    < > 9.4 9.0  PHOS 3.5  --   --   --   CREATININE  --    < > 9.42* 7.52*  K  --    < > 4.3 4.1   < > = values in this interval not displayed.   Recent Labs  Lab 12/13/23 0816  IRON 61  TIBC 150*  FERRITIN 2,103*   Inpatient medications:  amiodarone   200 mg Oral BID   atorvastatin   80 mg Oral QHS   Chlorhexidine  Gluconate Cloth  6 each Topical Q0600   Chlorhexidine  Gluconate Cloth  6 each Topical Q0600   Chlorhexidine  Gluconate Cloth  6 each Topical Q0600   docusate sodium   100 mg Oral BID   fluticasone  furoate-vilanterol  1 puff Inhalation Daily   hydrOXYzine   25 mg Oral TID   lanthanum   2,000 mg Oral TID WC   And   lanthanum   1,000 mg Oral BID BM   metoprolol  succinate  100 mg Oral Daily   mexiletine  300 mg Oral BID   pantoprazole   40 mg Oral BID   polyethylene glycol  17 g Oral BID   sodium chloride  flush  3 mL Intravenous Q12H    heparin  750 Units/hr (12/18/23 0857)   acetaminophen  **OR** acetaminophen , bisacodyl , calcium  carbonate, levalbuterol , nitroGLYCERIN , ondansetron  **OR** ondansetron  (ZOFRAN ) IV, mouth rinse, senna, simethicone , sodium chloride  flush

## 2023-12-18 NOTE — Progress Notes (Signed)
 Patient noted with heart rate in the 30s on monitor. Patient assessed BP 75/55 and rechecked at 81/52. Heart rate returned to >70 as well. Provider notified of vital signs not with in normal limits. Continue to monitor

## 2023-12-18 NOTE — Progress Notes (Signed)
 Progress Note   Patient: Victor Thompson DOB: [birth year] DOA: 12/10/2023     8 DOS: the patient was seen and examined on 12/18/2023   Brief hospital course: 62 year old African-American male, with past medical history significant for ESRD on HD TTS schedule, ischemic cardiomyopathy, congestive heart failure with reduced EF (25 to 30%), history of CAD, VT storm status post ICD, currently on Eliquis  and amiodarone , reactive airway disease and hyperlipidemia presented with epigastric pain, shortness of breath, cough and chest pain.  Further workup in the ED showed pancreatic mass concerning for malignancy.  Patient was transferred to [facility] as patient will likely need endoscopic ultrasound guided biopsy of pancreatic mass.  GI is following him.   12/14/2023:  EUS guided biopsy of the pancreatic mass planned for today.  On heparin  drip.  Nephrology on board for Hemodialysis needs.  Assessment and Plan: Pancreatic Mass-  GI follow up appreciated. Pt now s/p EUS on 1/8 Follow cytology. Heparin  drip to be restarted post procedure as instructed. LFT, Lipase levels trending up today, see below Continue pain control, antiemetics PRN. Per GI, hold DOAC x for 48hrs after procedure, currently on heparin  gtt -Per GI, tissue appears not malignant and is more consistent with chronic pancreatitis -LFT's continue to trend up. Pt is pending MRCP per GI   Essential Hypertension BP stable   Combined systolic and diastolic CHF: Continued metoprolol , bidil . No evidence of acute CHF exacerbation.   H/o ventricular tachycardia s/p ICD. Continue Amiodarone , metoprolol , mexiletine Continues to have runs of VT here, asymptomatic Per records, pt had elected to disable ICD in the past, does not want it on again Given hx of difficult to manage VT, had consulted Cardiology for assistance  Elevated trop -Trop over 300 today in the setting of VT -Pt is continued on heparin  gtt -F/u with  Cardiology recs   Reactive airway disease- COVID, flu and RSV negative.   Chest x-ray unremarkable.  CTA chest unremarkable. Continue with Breo Ellipta  twice daily and Xopenex  every 6 hour as needed for any wheezing shortness of breath   ESRD on dialysis TTS schedule Last dialysis 12/12/22 with 3.5L removed. No sob. Nephrology on board for HD needs.   Obesity, Class II BMI 36.64 Diet, exercise and weight reduction advised. Continue CPAP at night for OSA.  Constipation -Continue with scheduled cathartics -two bowel movements reported overnight  Post-ercp pancreatitis -Lipase is down to 116 -Per GI, plan to advance diet if he can tolerate. If pt is unable to tolerate diet, then consider nutritional support with NG and tube feed  Code status -Pt reaffirms Full Code status   Subjective: Asking about dialysis today  Physical Exam: Vitals:   12/18/23 0356 12/18/23 0759 12/18/23 1000 12/18/23 1345  BP: 102/63 99/75  (!) 90/46  Pulse: 69 68    Resp: 18 16    Temp: 98 F (36.7 C) 98.4 F (36.9 C)    TempSrc:      SpO2: 92% 98%    Weight: 103.3 kg  96.5 kg   Height:       General exam: Awake, laying in bed, in nad Respiratory system: Normal respiratory effort, no wheezing Cardiovascular system: regular rate, s1, s2 Gastrointestinal system: Soft, nondistended, positive BS Central nervous system: CN2-12 grossly intact, strength intact Extremities: Perfused, no clubbing Skin: Normal skin turgor, no notable skin lesions seen Psychiatry: Mood normal // no visual hallucinations   Data Reviewed:  There are no new results to review at this time.  Family Communication: Pt in room, family at bedside  Disposition: Status is: Inpatient Remains inpatient appropriate because: severity of illness  Planned Discharge Destination: Rehab    Author: Garnette Pelt, MD 12/18/2023 3:12 PM  For on call review [email or web address].

## 2023-12-18 NOTE — Progress Notes (Signed)
 Spoke with radiology. Due to ICD, MRCP will have to be during the week for certain precautions that need to be taken.   MRCP planned for tomorrow per radiology.

## 2023-12-18 NOTE — Progress Notes (Addendum)
 Patient Name: OWENS HARA Date of Encounter: 12/18/2023 [facility] Cardiologist: Ezra Shuck, MD   Interval Summary  .    No acute overnight events.  Patient did have some bradycardia on telemetry overnight but reports that he did take off his CPAP machine.  Burden of ventricular arrhythmia is decreased significantly since yesterday.  He does not report any real change in symptoms.  He has no new or acute complaints today.  Vital Signs .    Vitals:   12/18/23 0207 12/18/23 0356 12/18/23 0759 12/18/23 1000  BP: (!) 81/52 102/63 99/75   Pulse:  69 68   Resp:  18 16   Temp:  98 F (36.7 C) 98.4 F (36.9 C)   TempSrc:      SpO2:  92% 98%   Weight:  103.3 kg  96.5 kg  Height:        Intake/Output Summary (Last 24 hours) at 12/18/2023 1258 Last data filed at 12/18/2023 0903 Gross per 24 hour  Intake 610 ml  Output 0 ml  Net 610 ml      12/18/2023   10:00 AM 12/18/2023    3:56 AM 12/17/2023    2:16 PM  Last 3 Weights  Weight (lbs) 212 lb 11.9 oz 227 lb 11.8 oz 214 lb 11.7 oz  Weight (kg) 96.5 kg 103.3 kg 97.4 kg      Telemetry/ECG    Predominantly sinus rhythm.  Some nocturnal bradycardia.  Intermittent episodes of NSVT that are much less frequent and shorter in duration than yesterday.- Personally Reviewed  Physical Exam .   GEN: No acute distress.   Neck: No JVD Cardiac: Normal rate and regular rhythm. Respiratory: Clear to auscultation bilaterally. GI: Soft, non-distended  MS: No edema  Assessment & Plan .     Echo 12/11/23:   1. Left ventricular ejection fraction, by estimation, is 30 to 35%. The  left ventricle has moderately decreased function. The left ventricle  demonstrates global hypokinesis. The left ventricular internal cavity size  was moderately dilated. Left  ventricular diastolic parameters are consistent with Grade I diastolic  dysfunction (impaired relaxation).   2. Right ventricular systolic function is normal. The right  ventricular  size is normal. Tricuspid regurgitation signal is inadequate for assessing  PA pressure.   3. The mitral valve is abnormal. Mild mitral valve regurgitation. No  evidence of mitral stenosis.   4. The tricuspid valve is abnormal.   5. The aortic valve is tricuspid. Aortic valve regurgitation is not  visualized. No aortic stenosis is present.   6. Aortic dilatation noted. There is mild dilatation of the ascending  aorta, measuring 36 mm.   7. The inferior vena cava is normal in size with greater than 50%  respiratory variability, suggesting right atrial pressure of 3 mmHg.    RHC/LHC 08/25/22:   1st Diag lesion is 60% stenosed.   Ost LAD to Prox LAD lesion is 40% stenosed.   Mid Cx lesion is 30% stenosed.   Prox RCA lesion is 30% stenosed.   1. Elevated right and left heart filling pressures with pulmonary venous hypertension.  2. Nonobstructive CAD.    Assessment: Mr. Andrian Sabala is a 63 year old male with a past medical history notable for CAD (PCI 2007), chronic systolic heart failure secondary to ICM w/hx of VT arrest and ICD (at [facility] center in 2015), recurrent ventricular tachycardia, dismal atrial fibrillation, HTN, HLD, ESRD on HD who is currently admitted with pancreatitis.  Hospital course has been complicated by frequent episodes of merrily nonsustained ventricular tachycardia.  Patient has a complex history of ventricular tachycardia.  He has failed several antiarrhythmics previously.  He has had admissions for VT storm.  Because of all this he has requested that his ICD be turned off.  I discussed with him regarding reactivating his ICD for the possibility of ATP therapies, although this would also come with the risk of ICD shock and he does not wish to have his ICD turned back on.  It is possible that his VT is just exacerbated by increased sympathetic tone in the setting of acute illness.  Alternatively, he does have a increase in his cardiac troponin in the  setting of more frequent episodes.  He is not actively having chest pain but has chest pain chronically.  There is also been suspicion for cardiac sarcoidosis, which if actively inflamed would increase his arrhythmia burden.  He has a cardiac PET scan for sarcoidosis on 12/21/2023, which has been difficult for him to get (denied by insurance multiple times over the years).  I think we should perform cardiac PET study as well as PET stress to look for contributing factors to his VT.  In the past 24 hours, VT burden has decreased significantly.  Likely either related to volume removal from hemodialysis or changing to higher doses of beta-blockade.   Problem List:  #Recurrent ventricular tachycardia #NSTEMI -likely type II from demand ischemia in the setting of VT, but has known coronary disease #Ischemic cardiomyopathy #Chronic systolic heart failure #Paroxysmal atrial fibrillation   Plan:  -Continue oral amiodarone  200 mg twice daily. -Continue mexiletine 300 mg twice daily. -Continue metoprolol  XL 100 mg once daily. -Continue heparin  drip. -Volume management with hemodialysis. -Patient should undergo scheduled cardiac PET on 12/21/2023.  If he remains inpatient at that time, then we should arrange for transportation so study can be completed. -Patient does not want ICD therapies reactivated at this time.  Confirmed again today.  For questions or updates, please contact [facility] Please consult [email or web address] for contact info under        Signed, Fonda Kitty, MD

## 2023-12-18 NOTE — Progress Notes (Signed)
   12/18/23 0009  BiPAP/CPAP/SIPAP  $ Non-Invasive Home Ventilator  Subsequent  BiPAP/CPAP/SIPAP Pt Type Adult  BiPAP/CPAP/SIPAP Resmed  Mask Type Full face mask  Mask Size Medium  EPAP 8 cmH2O  FiO2 (%) 21 %  Patient Home Equipment No  CPAP/SIPAP surface wiped down Yes

## 2023-12-18 NOTE — Treatment Plan (Signed)
 Notified by RN of sbp in the 60's, repeat manual cuff sbp in the 80's. Pt awake, conversant. Suspect low bp may be related to recent change in BB from coreg  to metoprolol  100mg  by Cardiology. Discussed with on-call cardiologist who has recommended, and ordered, PRN midodrine  5mg  for hypotension

## 2023-12-18 NOTE — Plan of Care (Signed)
  Problem: Education: Goal: Knowledge of General Education information will improve Description: Including pain rating scale, medication(s)/side effects and non-pharmacologic comfort measures Outcome: Progressing   Problem: Health Behavior/Discharge Planning: Goal: Ability to manage health-related needs will improve Outcome: Progressing   Problem: Clinical Measurements: Goal: Ability to maintain clinical measurements within normal limits will improve Outcome: Progressing Goal: Will remain free from infection Outcome: Progressing Goal: Diagnostic test results will improve Outcome: Progressing Goal: Respiratory complications will improve Outcome: Progressing   Problem: Nutrition: Goal: Adequate nutrition will be maintained Outcome: Progressing   Problem: Coping: Goal: Level of anxiety will decrease Outcome: Progressing   Problem: Elimination: Goal: Will not experience complications related to bowel motility Outcome: Progressing Goal: Will not experience complications related to urinary retention Outcome: Progressing   Problem: Pain Management: Goal: General experience of comfort will improve Outcome: Progressing   Problem: Skin Integrity: Goal: Risk for impaired skin integrity will decrease Outcome: Progressing

## 2023-12-19 ENCOUNTER — Inpatient Hospital Stay (HOSPITAL_COMMUNITY): Payer: 59

## 2023-12-19 ENCOUNTER — Encounter (HOSPITAL_COMMUNITY): Payer: Self-pay | Admitting: Internal Medicine

## 2023-12-19 DIAGNOSIS — K8500 Idiopathic acute pancreatitis without necrosis or infection: Secondary | ICD-10-CM | POA: Diagnosis not present

## 2023-12-19 DIAGNOSIS — R079 Chest pain, unspecified: Secondary | ICD-10-CM | POA: Diagnosis not present

## 2023-12-19 DIAGNOSIS — R109 Unspecified abdominal pain: Secondary | ICD-10-CM | POA: Diagnosis not present

## 2023-12-19 DIAGNOSIS — Z792 Long term (current) use of antibiotics: Secondary | ICD-10-CM

## 2023-12-19 DIAGNOSIS — K8689 Other specified diseases of pancreas: Secondary | ICD-10-CM | POA: Diagnosis not present

## 2023-12-19 DIAGNOSIS — N186 End stage renal disease: Secondary | ICD-10-CM | POA: Diagnosis not present

## 2023-12-19 DIAGNOSIS — I472 Ventricular tachycardia, unspecified: Secondary | ICD-10-CM | POA: Diagnosis not present

## 2023-12-19 LAB — HEPARIN LEVEL (UNFRACTIONATED)
Heparin Unfractionated: 0.29 [IU]/mL — ABNORMAL LOW (ref 0.30–0.70)
Heparin Unfractionated: 0.29 [IU]/mL — ABNORMAL LOW (ref 0.30–0.70)

## 2023-12-19 LAB — CBC
HCT: 33.4 % — ABNORMAL LOW (ref 39.0–52.0)
Hemoglobin: 11.5 g/dL — ABNORMAL LOW (ref 13.0–17.0)
MCH: 29.3 pg (ref 26.0–34.0)
MCHC: 34.4 g/dL (ref 30.0–36.0)
MCV: 85 fL (ref 80.0–100.0)
Platelets: 216 10*3/uL (ref 150–400)
RBC: 3.93 MIL/uL — ABNORMAL LOW (ref 4.22–5.81)
RDW: 17.7 % — ABNORMAL HIGH (ref 11.5–15.5)
WBC: 9.1 10*3/uL (ref 4.0–10.5)
nRBC: 0 % (ref 0.0–0.2)

## 2023-12-19 LAB — COMPREHENSIVE METABOLIC PANEL
ALT: 295 U/L — ABNORMAL HIGH (ref 0–44)
AST: 452 U/L — ABNORMAL HIGH (ref 15–41)
Albumin: 1.9 g/dL — ABNORMAL LOW (ref 3.5–5.0)
Alkaline Phosphatase: 194 U/L — ABNORMAL HIGH (ref 38–126)
Anion gap: 14 (ref 5–15)
BUN: 36 mg/dL — ABNORMAL HIGH (ref 6–20)
CO2: 26 mmol/L (ref 22–32)
Calcium: 9.2 mg/dL (ref 8.9–10.3)
Chloride: 92 mmol/L — ABNORMAL LOW (ref 98–111)
Creatinine, Ser: 8.98 mg/dL — ABNORMAL HIGH (ref 0.61–1.24)
GFR, Estimated: 6 mL/min — ABNORMAL LOW (ref >60)
Glucose, Bld: 95 mg/dL (ref 70–99)
Potassium: 4.3 mmol/L (ref 3.5–5.1)
Sodium: 132 mmol/L — ABNORMAL LOW (ref 135–145)
Total Bilirubin: 1.7 mg/dL — ABNORMAL HIGH (ref 0.0–1.2)
Total Protein: 6.4 g/dL — ABNORMAL LOW (ref 6.5–8.1)

## 2023-12-19 LAB — AMMONIA: Ammonia: 30 umol/L (ref 9–35)

## 2023-12-19 MED ORDER — MIDODRINE HCL 5 MG PO TABS
10.0000 mg | ORAL_TABLET | ORAL | Status: AC
  Administered 2023-12-19: 10 mg via ORAL
  Filled 2023-12-19: qty 2

## 2023-12-19 MED ORDER — IOHEXOL 350 MG/ML SOLN
75.0000 mL | Freq: Once | INTRAVENOUS | Status: AC | PRN
  Administered 2023-12-19: 75 mL via INTRAVENOUS

## 2023-12-19 NOTE — Progress Notes (Signed)
 Physical Therapy Treatment Patient Details Name: Victor Thompson MRN: [id number] DOB: 1960-12-08 Today's Date: 12/19/2023   History of Present Illness Pt is a 63 y/o M admitted on 12/10/23 after presenting with c/o epigastric pain, SOB, cough & chest pain. Workup showed pancreatic mass concerning for malignancy. PMH: ESRD on HD TTS, ischemic cardiomyopathy, CHF with reduced EF, CAD, VT storm s/p ICD, reactive airway disease, HLD    PT Comments  Pt very different from initial eval. Confused, mildly agitated. Oriented to self only. RN reports he fell yesterday. Seen in session with OT for safety, cog assessment. +2 mod assist sup<>sit. Pt sat EOB supervision, good sitting balance. Soft BP today. Currently NPO awaiting MRCP. Pt in R sidelying in bed at end of session.    If plan is discharge home, recommend the following: A little help with walking and/or transfers;A little help with bathing/dressing/bathroom;Assistance with cooking/housework;Assist for transportation;Help with stairs or ramp for entrance   Can travel by private vehicle        Equipment Recommendations  Rolling walker (2 wheels)    Recommendations for Other Services       Precautions / Restrictions Precautions Precautions: Fall;Other (comment) Precaution Comments: watch HR and BP Restrictions Weight Bearing Restrictions Per Provider Order: No     Mobility  Bed Mobility Overal bed mobility: Needs Assistance Bed Mobility: Sidelying to Sit, Sit to Sidelying   Sidelying to sit: Mod assist, +2 for safety/equipment     Sit to sidelying: Mod assist, +2 for safety/equipment General bed mobility comments: assist with trunk and BLE    Transfers                   General transfer comment: deferred due to hypotension and confusion    Ambulation/Gait                   Stairs             Wheelchair Mobility     Tilt Bed    Modified Rankin (Stroke Patients Only)       Balance Overall  balance assessment: Needs assistance Sitting-balance support: Feet supported, No upper extremity supported Sitting balance-Leahy Scale: Good                                      Cognition Arousal: Alert Behavior During Therapy: Flat affect, Agitated Overall Cognitive Status: Impaired/Different from baseline Area of Impairment: Orientation, Attention, Memory, Following commands, Safety/judgement, Awareness, Problem solving                 Orientation Level: Disoriented to, Place, Time, Situation Current Attention Level: Focused Memory: Decreased short-term memory Following Commands: Follows one step commands inconsistently Safety/Judgement: Decreased awareness of safety, Decreased awareness of deficits Awareness: Intellectual Problem Solving: Slow processing, Decreased initiation, Difficulty sequencing, Requires verbal cues General Comments: perseverating on taking medications after refusing most with nurse        Exercises      General Comments General comments (skin integrity, edema, etc.): soft BP (81/40 pre-rx and 93/52 post)      Pertinent Vitals/Pain Pain Assessment Pain Assessment: Faces Faces Pain Scale: No hurt    Home Living Family/patient expects to be discharged to:: Private residence Living Arrangements: Other relatives (cousin) Available Help at Discharge: Family;Available PRN/intermittently;Friend(s) Type of Home: House Home Access: Level entry     Alternate Level Stairs-Number of Steps: flight (13) Home  Layout: Two level;1/2 bath on main level;Bed/bath upstairs        Prior Function            PT Goals (current goals can now be found in the care plan section) Acute Rehab PT Goals Patient Stated Goal: not stated Progress towards PT goals: Not progressing toward goals - comment (increased confusion)    Frequency    Min 1X/week      PT Plan      Co-evaluation PT/OT/SLP Co-Evaluation/Treatment: Yes Reason for  Co-Treatment: Necessary to address cognition/behavior during functional activity PT goals addressed during session: Mobility/safety with mobility;Balance OT goals addressed during session: ADL's and self-care      AM-PAC PT 6 Clicks Mobility   Outcome Measure  Help needed turning from your back to your side while in a flat bed without using bedrails?: A Little Help needed moving from lying on your back to sitting on the side of a flat bed without using bedrails?: A Lot Help needed moving to and from a bed to a chair (including a wheelchair)?: Total Help needed standing up from a chair using your arms (e.g., wheelchair or bedside chair)?: Total Help needed to walk in hospital room?: Total Help needed climbing 3-5 steps with a railing? : Total 6 Click Score: 9    End of Session   Activity Tolerance: Treatment limited secondary to agitation;Treatment limited secondary to medical complications (Comment) (soft BP, confusion) Patient left: in bed;with call bell/phone within reach;with bed alarm set Nurse Communication: Mobility status PT Visit Diagnosis: Muscle weakness (generalized) (M62.81);Other abnormalities of gait and mobility (R26.89)     Time: 8888-8868 PT Time Calculation (min) (ACUTE ONLY): 20 min  Charges:    $Therapeutic Activity: 8-22 mins PT General Charges $$ ACUTE PT VISIT: 1 Visit                     Victor MATSU., PT  Office # [phone]    Victor Thompson 12/19/2023, 12:43 PM

## 2023-12-19 NOTE — Progress Notes (Signed)
 Patient refused AM labs

## 2023-12-19 NOTE — Progress Notes (Signed)
 [facility] Progress Note  Subjective: Seen in room No c/o; soft BPs overnight given midodrine  For MRCP with inc AST/ALT, ALP, TBili  Vitals:   12/19/23 0009 12/19/23 0126 12/19/23 0128 12/19/23 0227  BP: (!) 80/47 (!) 71/43 (!) 80/48 (!) 87/60  Pulse: 61 61  (!) 59  Resp: 18   18  Temp:    97.6 F (36.4 C)  TempSrc:      SpO2: 95% 100%  94%  Weight:      Height:        Exam: Gen alert, no distress No jvd or bruits Chest clear bilat to bases RRR no MRG Abd soft ntnd no mass or ascites +bs Ext no LE edema Neuro is alert, Ox 3 , nf    AVF+bruit        Renal-related home meds: - eliquis  5 bid - coreg  6.25 bid, bidil  20-37.5 bid - fosrenol  2 gm ac tid - others: mexiletine 300 bid, PPI, symbicort , statin, amiodarone       OP HD: [facility]   4h   450 /1.5  102kg  2/2 bath   AVF   Heparin  3000 - hect 9 micrograms three times per week - no esa   CXR 1/04 on admission - no acute disease   Assessment/ Plan: Pancreatic mass - w/ abd pain, CT +for mass a head of pancreas, also multiple local enlarged LN's. SP EUS on 1/08 w/ biopsy appearing not malignant and more c/w with chronic pancreatitis. For MRCP today given inc LFTs ESKD - on HD TTS. Next HD 1/14.  HTN - BP's soft. DC'd bidil  for now. On MTP and prn midodrine ; per cardiology 2/2 #8; reduce dose of MTP? Volume - CXR negative. Under outpt EDW, adjust down at DC.  Gentle UF moving forward Anemia of eskd - Hb 11s, not on esa at OP unit. Follow.  Secondary hyperparathyroidism - CCa in range. Cont binders w/ meals.  H/o combined heart failure H/o VT storm/ ischemic CM/ PAF - sp ICD and takes mexilitene, BB and amiodarone . Cardiology is following.   Victor KATHEE Gasman, MD  12/19/2023, 9:18 AM  Recent Labs  Lab 12/17/23 [phone] 12/18/23 0653 12/19/23 0809  HGB 11.9* 11.1* 11.5*  ALBUMIN  2.0* 1.8*  --   CALCIUM  9.4 9.0  --   CREATININE 9.42* 7.52*  --   K 4.3 4.1  --    Recent Labs  Lab  12/13/23 0816  IRON 61  TIBC 150*  FERRITIN 2,103*   Inpatient medications:  amiodarone   200 mg Oral BID   Chlorhexidine  Gluconate Cloth  6 each Topical Q0600   Chlorhexidine  Gluconate Cloth  6 each Topical Q0600   Chlorhexidine  Gluconate Cloth  6 each Topical Q0600   docusate sodium   100 mg Oral BID   fluticasone  furoate-vilanterol  1 puff Inhalation Daily   hydrOXYzine   25 mg Oral TID   lanthanum   2,000 mg Oral TID WC   And   lanthanum   1,000 mg Oral BID BM   metoprolol  succinate  100 mg Oral Daily   mexiletine  300 mg Oral BID   pantoprazole   40 mg Oral BID   polyethylene glycol  17 g Oral BID   sodium chloride  flush  3 mL Intravenous Q12H    heparin  750 Units/hr (12/18/23 0857)   acetaminophen  **OR** acetaminophen , bisacodyl , calcium  carbonate, levalbuterol , midodrine , nitroGLYCERIN , ondansetron  **OR** ondansetron  (ZOFRAN ) IV, mouth rinse, senna, simethicone , sodium chloride  flush

## 2023-12-19 NOTE — Progress Notes (Signed)
 PHARMACY - ANTICOAGULATION CONSULT NOTE  Pharmacy Consult for Heparin  (Eliquis  on hold) Indication: atrial fibrillation  Allergies  Allergen Reactions   Ace Inhibitors Swelling    Swelling of the tongue   Influenza Vaccines Hives and Swelling    SWELLING REACTION UNSPECIFIED    Ketorolac Swelling    SWELLING REACTION UNSPECIFIED    Lidocaine  Swelling    TONGUE SWELLS   Lisinopril Swelling    TONGUE SWELLING Pt reported problem with a BP med which sounded like  lisinopril  But as of 09/19/06,pt had tolerated altace without problem   Penicillins Swelling    TONGUE SWELLS Has patient had a PCN reaction causing immediate rash, facial/tongue/throat swelling, SOB or lightheadedness with hypotension: Yes Has patient had a PCN reaction causing severe rash involving mucus membranes or skin necrosis: No Has patient had a PCN reaction that required hospitalization No Has patient had a PCN reaction occurring within the last 10 years: Yes If all of the above answers are NO, then may proceed with Cephalosporin use.     Patient Measurements: Height: 5' 7 (170.2 cm) Weight: 96.5 kg (212 lb 11.9 oz) IBW/kg (Calculated) : 66.1 Heparin  Dosing Weight: 89 kg  Vital Signs: Temp: 97.6 F (36.4 C) (01/13 0227) BP: 87/60 (01/13 0227) Pulse Rate: 59 (01/13 0227)  Labs: Recent Labs    12/16/23 0759 12/17/23 0706 12/17/23 0929 12/17/23 1135 12/18/23 0653  HGB 12.5* 11.9*  --   --  11.1*  HCT 37.0* 34.3*  --   --  32.2*  PLT 191 205  --   --  181  HEPARINUNFRC 0.54 0.34  --   --  0.30  CREATININE 7.66* 9.42*  --   --  7.52*  TROPONINIHS  --   --  381* 361*  --     Estimated Creatinine Clearance: 11.6 mL/min (A) (by C-G formula based on SCr of 7.52 mg/dL (H)).  Assessment: 63 yr old male on Eliquis  5 mg BID prior to admission for hx atrial fibrillation.  Eliquis  now held for EUS with FN biopsy of pancreatic head mass. Pharmacy consulted to restart IV heparin  bridge at midnight 1/9  post procedure  Eliquis  to not restart until at least 48 hours post procedure completed on 1/8  Heparin  level 0.29 just below therapeutic range despite small rate increase to 750 units/hr (for HL 0.30).  Hgb 11.5, pltc 216 - stable.  No issues with infusion and no signs of bleeding noted per RN. Per discussion with provider wants to continue heparin  for now with concerns for potential obstructing stone and elevated troponins.  For MRCP today.  Goal of Therapy:  Heparin  level 0.3-0.7 Monitor platelets by anticoagulation protocol: Yes   Plan:  Increase heparin  to 850 untis/hr  8 hour heparin  level Heparin  level and CBC daily. Follow up plans to resume Eliquis  after procedures  Thank you for involving pharmacy in the patient's care.   Maurilio Fila, PharmD Clinical Pharmacist 12/19/2023  9:57 AM

## 2023-12-19 NOTE — Progress Notes (Signed)
 Transition of Care [facility]) - Inpatient Brief Assessment   Patient Details  Name: Victor Thompson MRN: [id number] Date of Birth: February 01, 1961  Transition of Care [facility] CM/SW Contact:    Rosaline JONELLE Joe, RN Phone Number: 12/19/2023, 4:19 PM   Clinical Narrative: CM noted that patient was confused today and only oriented to self.  No family members are present in the hospital room at this time.  Patient was independent prior to admission and normally worked a full-time job at [employer] and drove self to [employer] independently.  I called spoke with the patient's cousin, Erminio, on the phone and she provided agreement/ permission to fax patient out for SNF placement.  No DME at the home at the present.  TOC Team will continue to follow the patient for SNF placement considering patient has cognitive problems at this time and is only oriented to self.   Transition of Care Asessment: Insurance and Status: (P) Insurance coverage has been reviewed Patient has primary care physician: (P) Yes Home environment has been reviewed: (P) from home with cousin Prior level of function:: (P) Independent prior to admission Prior/Current Home Services: (P) No current home services Social Drivers of Health Review: (P) SDOH reviewed needs interventions Readmission risk has been reviewed: (P) Yes Transition of care needs: (P) transition of care needs identified, TOC will continue to follow

## 2023-12-19 NOTE — Progress Notes (Signed)
 Progress Note   Patient: Victor Thompson FMW:[id number] DOB: 14-Mar-1961 DOA: 12/10/2023     9 DOS: the patient was seen and examined on 12/19/2023   Brief hospital course: 63 year old African-American male, with past medical history significant for ESRD on HD TTS schedule, ischemic cardiomyopathy, congestive heart failure with reduced EF (25 to 30%), history of CAD, VT storm status post ICD, currently on Eliquis  and amiodarone , reactive airway disease and hyperlipidemia presented with epigastric pain, shortness of breath, cough and chest pain.  Further workup in the ED showed pancreatic mass concerning for malignancy.  Patient was transferred to [facility] as patient will likely need endoscopic ultrasound guided biopsy of pancreatic mass.  GI is following him.   12/14/2023:  EUS guided biopsy of the pancreatic mass planned for today.  On heparin  drip.  Nephrology on board for Hemodialysis needs.  Assessment and Plan: Pancreatic Mass-  GI follow up appreciated. Pt now s/p EUS on 1/8 Follow cytology. Heparin  drip to be restarted post procedure as instructed. LFT, Lipase levels trending up today, see below Continue pain control, antiemetics PRN. Per GI, hold DOAC x for 48hrs after procedure, currently on heparin  gtt -Per GI, tissue appears not malignant and is more consistent with chronic pancreatitis -alk phos, ALT trending up but AST trended down slightly -MRCP is pending per GI   Essential Hypertension Recently hypotensive requiring PRN midodrine  Have discussed with Cardiology who are aware.  -had placed hold parameters on metoprolol  that was started by EP   Combined systolic and diastolic CHF: Continued metoprolol  with hold parameters, bidil  discontinued No evidence of acute CHF exacerbation.   H/o ventricular tachycardia s/p ICD. Continue Amiodarone , metoprolol , mexiletine Continues to have runs of VT here, asymptomatic Per records, pt had elected to disable ICD in the past,  does not want it on again Given hx of difficult to manage VT, had consulted Cardiology for assistance  Elevated trop -Trop over 300 today in the setting of VT -Pt is continued on heparin  gtt -F/u with Cardiology recs   Reactive airway disease- COVID, flu and RSV negative.   Chest x-ray unremarkable.  CTA chest unremarkable. Continue with Breo Ellipta  twice daily and Xopenex  every 6 hour as needed for any wheezing shortness of breath   ESRD on dialysis TTS schedule Last dialysis 12/12/22 with 3.5L removed. No sob. Nephrology on board for HD needs.   Obesity, Class II BMI 36.64 Diet, exercise and weight reduction advised. Continue CPAP at night for OSA.  Constipation -Continue with scheduled cathartics -two bowel movements reported overnight  Post-ercp pancreatitis -Lipase is down to 116 -Per GI, plan to advance diet if he can tolerate. If pt is unable to tolerate diet, then consider nutritional support with NG and tube feed  Code status -Pt reaffirmed Full Code status -Have consulted Palliative Care to readdress GOC   Subjective: Without complaints this AM. Noted to have some confusion, CT head pending  Physical Exam: Vitals:   12/19/23 0128 12/19/23 0227 12/19/23 0956 12/19/23 1226  BP: (!) 80/48 (!) 87/60 (!) 81/40 (!) 93/52  Pulse:  (!) 59 73 69  Resp:  18    Temp:  97.6 F (36.4 C) 98.1 F (36.7 C) 97.9 F (36.6 C)  TempSrc:   Oral   SpO2:  94% 93% 91%  Weight:      Height:       General exam: Conversant, in no acute distress Respiratory system: normal chest rise, clear, no audible wheezing Cardiovascular system: regular rhythm,  s1-s2 Gastrointestinal system: Nondistended, nontender, pos BS Central nervous system: No seizures, no tremors Extremities: No cyanosis, no joint deformities Skin: No rashes, no pallor Psychiatry: Affect normal // no auditory hallucinations   Data Reviewed:  Labs reviewed: na 132, k 4.3, Alk phos 194, AST 452, ALT 295, WBC 9.1,  Hgb 11.5  Family Communication: Pt in room, family not at bedside  Disposition: Status is: Inpatient Remains inpatient appropriate because: severity of illness  Planned Discharge Destination: Rehab    Author: Garnette Pelt, MD 12/19/2023 12:56 PM  For on call review [email or web address].

## 2023-12-19 NOTE — Evaluation (Signed)
 Occupational Therapy Evaluation Patient Details Name: Victor Thompson MRN: [id number] DOB: 04/23/1961 Today's Date: 12/19/2023   History of Present Illness Pt is a 63 y/o M admitted on 12/10/23 after presenting with c/o epigastric pain, SOB, cough & chest pain. Workup showed pancreatic mass concerning for malignancy. PMH: ESRD on HD TTS, ischemic cardiomyopathy, CHF with reduced EF, CAD, VT storm s/p ICD, reactive airway disease, HLD   Clinical Impression   Pt is typically independent, drive and works at [employer]. Presents with confusion, borderline agitation. He is disoriented to place, time and situation, refusing medications and demonstrates poor awareness of deficits and safety. Pt requiring moderate assistance for bed mobility, second person for safety due to cognition. He demonstrated good sitting balance at EOB. Pt with hypotension, per nurse, did not progress to OOB as pt was symptomatic earlier today. Cognitively and functionally, this is a significant change vs when pt was evaluated by PT on 1/10. Patient will benefit from continued inpatient follow up therapy, <3 hours/day, but hopefully cognition with improve for return home.       If plan is discharge home, recommend the following: Two people to help with walking and/or transfers;A lot of help with bathing/dressing/bathroom;Assistance with cooking/housework;Assistance with feeding;Direct supervision/assist for medications management;Direct supervision/assist for financial management;Assist for transportation;Help with stairs or ramp for entrance    Functional Status Assessment  Patient has had a recent decline in their functional status and demonstrates the ability to make significant improvements in function in a reasonable and predictable amount of time.  Equipment Recommendations  Other (comment) (defer)    Recommendations for Other Services       Precautions / Restrictions Precautions Precautions:  Fall Precaution Comments: watch HR and BP Restrictions Weight Bearing Restrictions Per Provider Order: No      Mobility Bed Mobility Overal bed mobility: Needs Assistance Bed Mobility: Sidelying to Sit, Sit to Sidelying   Sidelying to sit: Mod assist, +2 for safety/equipment     Sit to sidelying: Mod assist, +2 for safety/equipment General bed mobility comments: assist to raise trunk, to guide trunk and assist LEs back into bed    Transfers                   General transfer comment: deferred due to hypotension      Balance Overall balance assessment: Needs assistance   Sitting balance-Leahy Scale: Good                                     ADL either performed or assessed with clinical judgement   ADL                                         General ADL Comments: demonstrated ability to drink from cup, held sock in hand, but did not appear to recognize it     Vision Ability to See in Adequate Light: 0 Adequate       Perception         Praxis         Pertinent Vitals/Pain Pain Assessment Pain Assessment: Faces Faces Pain Scale: No hurt     Extremity/Trunk Assessment Upper Extremity Assessment Upper Extremity Assessment: Overall WFL for tasks assessed   Lower Extremity Assessment Lower Extremity Assessment: Defer to PT evaluation   Cervical /  Trunk Assessment Cervical / Trunk Assessment: Other exceptions (weakness, obesity)   Communication Communication Communication: No apparent difficulties   Cognition Arousal: Alert Behavior During Therapy: Flat affect Overall Cognitive Status: Impaired/Different from baseline Area of Impairment: Orientation, Attention, Memory, Following commands, Safety/judgement, Awareness, Problem solving                 Orientation Level: Disoriented to, Place, Time, Situation Current Attention Level: Focused Memory: Decreased short-term memory Following Commands: Follows one  step commands inconsistently Safety/Judgement: Decreased awareness of safety, Decreased awareness of deficits Awareness: Intellectual Problem Solving: Slow processing, Decreased initiation, Difficulty sequencing, Requires verbal cues General Comments: perseverating on taking medications after refusing most with nurse     General Comments       Exercises     Shoulder Instructions      Home Living Family/patient expects to be discharged to:: Private residence Living Arrangements: Other relatives (cousin) Available Help at Discharge: Family;Available PRN/intermittently;Friend(s) Type of Home: House Home Access: Level entry     Home Layout: Two level;1/2 bath on main level;Bed/bath upstairs Alternate Level Stairs-Number of Steps: flight (13) Alternate Level Stairs-Rails: Left                      Prior Functioning/Environment Prior Level of Function : Working/employed;Driving             Mobility Comments: Independent, works at [employer], notes 1 fall in the past 6 months 2/2 HR & falling, driving. ADLs Comments: independent        OT Problem List: Decreased strength;Obesity;Decreased safety awareness;Decreased cognition;Impaired balance (sitting and/or standing)      OT Treatment/Interventions: Self-care/ADL training;DME and/or AE instruction;Therapeutic activities;Cognitive remediation/compensation;Patient/family education;Balance training    OT Goals(Current goals can be found in the care plan section) Acute Rehab OT Goals OT Goal Formulation: Patient unable to participate in goal setting Time For Goal Achievement: 01/02/24 Potential to Achieve Goals: Good ADL Goals Pt Will Perform Grooming: with supervision;standing Pt Will Perform Upper Body Dressing: with supervision;sitting Pt Will Perform Lower Body Dressing: with supervision;sit to/from stand Pt Will Transfer to Toilet: with supervision;ambulating;regular height toilet Pt Will Perform  Toileting - Clothing Manipulation and hygiene: with supervision;sit to/from stand Additional ADL Goal #1: Pt will complete bed mobility modified independently in preparation for ADLs. Additional ADL Goal #2: Pt will be oriented to place, time and situation.  OT Frequency: Min 1X/week    Co-evaluation PT/OT/SLP Co-Evaluation/Treatment: Yes Reason for Co-Treatment: Necessary to address cognition/behavior during functional activity   OT goals addressed during session: ADL's and self-care      AM-PAC OT 6 Clicks Daily Activity     Outcome Measure Help from another person eating meals?: A Little Help from another person taking care of personal grooming?: A Little Help from another person toileting, which includes using toliet, bedpan, or urinal?: A Lot Help from another person bathing (including washing, rinsing, drying)?: A Lot Help from another person to put on and taking off regular upper body clothing?: A Lot Help from another person to put on and taking off regular lower body clothing?: A Lot 6 Click Score: 14   End of Session Nurse Communication: Other (comment) (hypotensive)  Activity Tolerance: Treatment limited secondary to agitation Patient left: in bed;with call bell/phone within reach;with bed alarm set  OT Visit Diagnosis: Muscle weakness (generalized) (M62.81);History of falling (Z91.81);Other symptoms and signs involving cognitive function  Time: 8886-8867 OT Time Calculation (min): 19 min Charges:  OT General Charges $OT Visit: 1 Visit OT Evaluation $OT Eval Moderate Complexity: 1 Mod Mliss HERO, OTR/L Acute Rehabilitation Services Office: [phone]  Kennth Mliss Helling 12/19/2023, 11:52 AM

## 2023-12-19 NOTE — Progress Notes (Signed)
 Progress Note   LOS: 9 days   Chief Complaint: pancreatic head mass    Subjective   Patient denies ab pain currently. He is NPO for MRI/MRCP today and states that he is very hungry. LFTs remain stably elevated.   Objective   Vital signs in last 24 hours: Temp:  [97.6 F (36.4 C)-98.1 F (36.7 C)] 98.1 F (36.7 C) (01/13 0956) Pulse Rate:  [59-73] 73 (01/13 0956) Resp:  [14-18] 18 (01/13 0227) BP: (67-90)/(40-61) 81/40 (01/13 0956) SpO2:  [93 %-100 %] 93 % (01/13 0956) FiO2 (%):  [21 %] 21 % (01/12 2345) Last BM Date : 12/18/23 Last BM recorded by nurses in past 5 days Stool Type: Type 7 (Liquid consistency with no solid pieces) (12/18/2023  2:00 PM)  General:   male in no acute distress  Heart:  Regular rate and rhythm; no murmurs Pulm: Clear anteriorly; no wheezing Abdomen: soft, nondistended, normal bowel sounds in all quadrants. Nontender without guarding. No organomegaly appreciated. Extremities:  No edema Neurologic:  Alert and  oriented x4;  No focal deficits.  Psych:  Cooperative. Normal mood and affect.  Intake/Output from previous day: 01/12 0701 - 01/13 0700 In: 60 [P.O.:60] Out: -  Intake/Output this shift: No intake/output data recorded.  Studies/Results: No results found.  Lab Results: Recent Labs    12/17/23 0706 12/18/23 0653 12/19/23 0809  WBC 14.8* 11.3* 9.1  HGB 11.9* 11.1* 11.5*  HCT 34.3* 32.2* 33.4*  PLT 205 181 216   BMET Recent Labs    12/17/23 0706 12/18/23 0653 12/19/23 0809  NA 132* 134* 132*  K 4.3 4.1 4.3  CL 94* 95* 92*  CO2 24 27 26   GLUCOSE 85 86 95  BUN 32* 26* 36*  CREATININE 9.42* 7.52* 8.98*  CALCIUM  9.4 9.0 9.2   LFT Recent Labs    12/19/23 0809  PROT 6.4*  ALBUMIN  1.9*  AST 452*  ALT 295*  ALKPHOS 194*  BILITOT 1.7*   PT/INR No results for input(s): LABPROT, INR in the last 72 hours.   Scheduled Meds:  amiodarone   200 mg Oral BID   Chlorhexidine  Gluconate Cloth  6 each Topical Q0600    Chlorhexidine  Gluconate Cloth  6 each Topical Q0600   Chlorhexidine  Gluconate Cloth  6 each Topical Q0600   docusate sodium   100 mg Oral BID   fluticasone  furoate-vilanterol  1 puff Inhalation Daily   hydrOXYzine   25 mg Oral TID   lanthanum   2,000 mg Oral TID WC   And   lanthanum   1,000 mg Oral BID BM   metoprolol  succinate  100 mg Oral Daily   mexiletine  300 mg Oral BID   pantoprazole   40 mg Oral BID   polyethylene glycol  17 g Oral BID   sodium chloride  flush  3 mL Intravenous Q12H   Continuous Infusions:  heparin  850 Units/hr (12/19/23 1107)      Patient profile:   63 year old male with history of CHF status post AICD, CAD, end-stage renal disease on dialysis, sleep apnea presents with epigastric discomfort shortness of breath and chest pain. CT abdomen pelvis shows large heterogeneously hypodense mass arising from pancreatic head also inseparable from descending duodenum measuring 5 x 3.8 x 4.7 cm concerning for malignancy.  Enlarged gastrohepatic ligament and celiac axis lymph nodes numerous prominent retroperitoneal lymph nodes, hypodense hepatic lesion adjacent to the falciform ligament generally characteristic and location appearance for focal fatty deposition but could be hepatic metastasis  Impression:   Pancreatic head mass with lymph node metastatic disease and elevated LFTs concerning for primary malignancy  EGD/EUS 1/8 showed white nummular lesions in esophageal mucosa, gastritis, prominent major and minor papilla, single duodenal polyp.  Masslike region in the superior pancreatic head that was biopsied. Pathology from EUS showed changes compatible with chronic pancreatitis and was negative for malignancy. Gastric biopsies showed gastropathy that was negative for H pylori. Esophageal biopsies potentially suggests esophagitis superficialis dissecans. Duodenal polyp was benign. Currently awaiting MRI/MRCP for further evaluation.   Post ERCP pancreatitis ERCP 1/8 now  with worsening upper abdominal pain post op and lipase of 501.  Likely post ERCP pancreatitis. Currently resolving abdominal pain today   Normocytic anemia Hgb stable No overt GI bleeding Potentially secondary to CKD/anemia of chronic disease -Continue to trend hemoglobin   CHF EF 30%, history of AICD Echo with EF 30 to 35% grade 1 diastolic mild MR no aortic stenosis   Nonobstructive CAD with elevated troponins .No chest pain currently, most recent troponin 47 and stable   End-stage renal disease  on dialysis 3 times weekly     Plan:   - Awaiting MRCP, which was delayed due to presence of ICD - Will consider repeat EUS with FNA later this week, potentially Wed.  Rosario JAYSON Kidney MD 12/19/2023, 11:34 AM

## 2023-12-19 NOTE — Progress Notes (Signed)
 Pt taken off cpap at this time. SpO2 on room air 94%. Pt refused scheduled inhaler therapy this am. RT will continue to monitor and be available as needed.

## 2023-12-19 NOTE — Progress Notes (Signed)
 Mobility Specialist: Progress Note   12/19/23 1521  Mobility  Activity Ambulated with assistance in hallway  Level of Assistance Moderate assist, patient does 50-74%  Assistive Device Front wheel walker  Distance Ambulated (ft) 160 ft  Activity Response Tolerated well  Mobility Referral Yes  Mobility visit 1 Mobility  Mobility Specialist Start Time (ACUTE ONLY) 1410  Mobility Specialist Stop Time (ACUTE ONLY) 1435  Mobility Specialist Time Calculation (min) (ACUTE ONLY) 25 min    Immediate Post Mobility: 168/146  Pt was agreeable to mobility session but very confused today - received dangling off bed trying to get up. MinA for STS and initially minA for ambulation but pt very unsteady with a heavy posterior lean that would cause LOBs - requiring ModA for correction. Often letting go of the RW as well. Returned to room without fault. Left in bed with all needs met, call bell in reach.   Ileana Lute Mobility Specialist Please contact via SecureChat or Rehab office at [phone]

## 2023-12-19 NOTE — Progress Notes (Signed)
  [name] Date of Encounter: 12/19/2023  Primary Cardiologist: Ezra Shuck, MD Electrophysiologist: Danelle Birmingham, MD  Interval Summary   Hypotensive overnight requiring additional PRN midodrine  He is resting this AM No acute complaints  Vital Signs    Vitals:   12/19/23 0009 12/19/23 0126 12/19/23 0128 12/19/23 0227  BP: (!) 80/47 (!) 71/43 (!) 80/48 (!) 87/60  Pulse: 61 61  (!) 59  Resp: 18   18  Temp:    97.6 F (36.4 C)  TempSrc:      SpO2: 95% 100%  94%  Weight:      Height:       No intake or output data in the 24 hours ending 12/19/23 0913 Filed Weights   12/17/23 1416 12/18/23 0356 12/18/23 1000  Weight: 97.4 kg 103.3 kg 96.5 kg    Physical Exam    GEN- The patient is well appearing, alert, appears somewhat confused   Lungs- Clear to ausculation bilaterally, normal work of breathing Cardiac- Regular rate and rhythm, no murmurs, rubs or gallops GI- soft, NT, ND, + BS Extremities- no clubbing or cyanosis. No edema  Telemetry    SR with intermittent worsening LBBB   (personally reviewed)  Hospital Course    Victor Thompson is a 63 y.o. male with a past medical history notable for CAD (PCI 2007), chronic systolic heart failure secondary to ICM w/hx of VT arrest and ICD (at [facility] center in 2015), recurrent ventricular tachycardia, dismal atrial fibrillation, HTN, HLD, ESRD on HD who is currently admitted with pancreatitis.   ICD therapies are currently off  Assessment & Plan    #) VT #) NSTEMI #) ICM s/p ICD with therapies off #) HFrEF #) parox AFib  VT burden seems improved, though having hypotension and some bradycardia Continue amiodarone  200mg  BID Continue mexiletine 300mg  BID Continue toprol  100mg  daily at this time Not a candidate for advanced EP procedures.  Consider palliative care with GOC discussion ICD therapies remain off  #) hypotension Can consider IVF for hypotension vs lactate and RHC if concerned for  low-output HF. Will defer to primary team +/- gen cards for further eval Do not favor midodrine  as long-term strategy for managing hypotension  #) Pancreatitis managed by primary team #) ESRD on HD - Fluid status managed by dialysis    Dr. Kennyth to see      For questions or updates, please contact [facility] Please consult [email or web address] for contact info under Cardiology/STEMI.  Signed, Chantal Needle, NP  12/19/2023, 9:13 AM

## 2023-12-19 NOTE — Progress Notes (Signed)
   12/18/23 2345  BiPAP/CPAP/SIPAP  $ Non-Invasive Home Ventilator  Subsequent  BiPAP/CPAP/SIPAP Pt Type Adult  BiPAP/CPAP/SIPAP Resmed  Mask Type Full face mask  Mask Size Medium  EPAP 8 cmH2O  FiO2 (%) 21 %  Patient Home Equipment No  Auto Titrate No  CPAP/SIPAP surface wiped down Yes  BiPAP/CPAP /SiPAP Vitals  SpO2 95 %

## 2023-12-19 NOTE — Progress Notes (Addendum)
 Rounding Note    [name] Date of Encounter: 12/19/2023  [facility] Cardiologist: Ezra Shuck, MD   Subjective   Upon entering the room, 2 therapists and RN at bedside - pt is confused and mildly irritated with staff. RN states patient was intermittently confused by report yesterday, but had been persistently confused since waking him up this morning. Therapists at bedside report today is a significant change in mental status since yesterday.   Pt refused exam by me. He denies pain and does not feel dizzy or pre-syncopal.  Currently BP is 81/40. Was given amiodarone  and midodrine , but now refusing additional medications.   Pt is due to go to MRCP today.  Inpatient Medications    Scheduled Meds:  amiodarone   200 mg Oral BID   Chlorhexidine  Gluconate Cloth  6 each Topical Q0600   Chlorhexidine  Gluconate Cloth  6 each Topical Q0600   Chlorhexidine  Gluconate Cloth  6 each Topical Q0600   docusate sodium   100 mg Oral BID   fluticasone  furoate-vilanterol  1 puff Inhalation Daily   hydrOXYzine   25 mg Oral TID   lanthanum   2,000 mg Oral TID WC   And   lanthanum   1,000 mg Oral BID BM   metoprolol  succinate  100 mg Oral Daily   mexiletine  300 mg Oral BID   pantoprazole   40 mg Oral BID   polyethylene glycol  17 g Oral BID   sodium chloride  flush  3 mL Intravenous Q12H   Continuous Infusions:  heparin  750 Units/hr (12/18/23 0857)   PRN Meds: acetaminophen  **OR** acetaminophen , bisacodyl , calcium  carbonate, levalbuterol , midodrine , nitroGLYCERIN , ondansetron  **OR** ondansetron  (ZOFRAN ) IV, mouth rinse, senna, simethicone , sodium chloride  flush   Vital Signs    Vitals:   12/19/23 0126 12/19/23 0128 12/19/23 0227 12/19/23 0956  BP: (!) 71/43 (!) 80/48 (!) 87/60 (!) 81/40  Pulse: 61  (!) 59 73  Resp:   18   Temp:   97.6 F (36.4 C) 98.1 F (36.7 C)  TempSrc:    Oral  SpO2: 100%  94% 93%  Weight:      Height:       No intake or output  data in the 24 hours ending 12/19/23 1026    12/18/2023   10:00 AM 12/18/2023    3:56 AM 12/17/2023    2:16 PM  Last 3 Weights  Weight (lbs) 212 lb 11.9 oz 227 lb 11.8 oz 214 lb 11.7 oz  Weight (kg) 96.5 kg 103.3 kg 97.4 kg      Telemetry    Currently in sinus rhythm with HR 70s - Personally Reviewed  ECG    No new tracings - Personally Reviewed  Physical Exam   GEN: not in distress, oriented to self and year Remainder of EP by MD Pt refused exam by me  Labs    High Sensitivity Troponin:   Recent Labs  Lab 12/10/23 2329 12/11/23 0216 12/11/23 0400 12/17/23 0929 12/17/23 1135  TROPONINIHS 50* 47* 47* 381* 361*     Chemistry Recent Labs  Lab 12/14/23 0653 12/15/23 0656 12/16/23 1010 12/17/23 0706 12/18/23 0653 12/19/23 0809  NA 134*   < >  --  132* 134* 132*  K 4.0   < >  --  4.3 4.1 4.3  CL 93*   < >  --  94* 95* 92*  CO2 25   < >  --  24 27 26   GLUCOSE 79   < >  --  85 86 95  BUN 13   < >  --  32* 26* 36*  CREATININE 7.01*   < >  --  9.42* 7.52* 8.98*  CALCIUM  9.3   < >  --  9.4 9.0 9.2  MG 2.0  --  1.9  --   --   --   PROT 6.2*   < >  --  6.1* 6.0* 6.4*  ALBUMIN  2.2*   < >  --  2.0* 1.8* 1.9*  AST 160*   < >  --  381* 595* 452*  ALT 162*   < >  --  201* 288* 295*  ALKPHOS 104   < >  --  135* 159* 194*  BILITOT 1.1   < >  --  1.1 1.7* 1.7*  GFRNONAA 8*   < >  --  6* 8* 6*  ANIONGAP 16*   < >  --  14 12 14    < > = values in this interval not displayed.    Lipids No results for input(s): CHOL, TRIG, HDL, LABVLDL, LDLCALC, CHOLHDL in the last 168 hours.  Hematology Recent Labs  Lab 12/17/23 0706 12/18/23 0653 12/19/23 0809  WBC 14.8* 11.3* 9.1  RBC 3.99* 3.74* 3.93*  HGB 11.9* 11.1* 11.5*  HCT 34.3* 32.2* 33.4*  MCV 86.0 86.1 85.0  MCH 29.8 29.7 29.3  MCHC 34.7 34.5 34.4  RDW 17.6* 17.6* 17.7*  PLT 205 181 216   Thyroid  No results for input(s): TSH, FREET4 in the last 168 hours.  BNPNo results for input(s): BNP, PROBNP  in the last 168 hours.  DDimer No results for input(s): DDIMER in the last 168 hours.   Radiology    No results found.  Cardiac Studies   Echo 12/11/23: 1. Left ventricular ejection fraction, by estimation, is 30 to 35%. The  left ventricle has moderately decreased function. The left ventricle  demonstrates global hypokinesis. The left ventricular internal cavity size  was moderately dilated. Left  ventricular diastolic parameters are consistent with Grade I diastolic  dysfunction (impaired relaxation).   2. Right ventricular systolic function is normal. The right ventricular  size is normal. Tricuspid regurgitation signal is inadequate for assessing  PA pressure.   3. The mitral valve is abnormal. Mild mitral valve regurgitation. No  evidence of mitral stenosis.   4. The tricuspid valve is abnormal.   5. The aortic valve is tricuspid. Aortic valve regurgitation is not  visualized. No aortic stenosis is present.   6. Aortic dilatation noted. There is mild dilatation of the ascending  aorta, measuring 36 mm.   7. The inferior vena cava is normal in size with greater than 50%  respiratory variability, suggesting right atrial pressure of 3 mmHg.   Patient Profile     63 y.o. male  CAD (PCI 2007), chronic systolic heart failure secondary to ICM w/hx of VT arrest and ICD (at [facility] center in 2015), recurrent ventricular tachycardia, paroxysmal atrial fibrillation, HTN, HLD, ESRD on HD who is currently admitted with epigastric pain and SOB found to have pancreatitis post ERCP  Assessment & Plan    Hypotension - BP 71/43 a 0126 --> now 81/40 - pt reports no symptoms but confused - received 10 mg midodrine  at 0138 and 5 mg at 1109 - appears that BP baseline has been in the 80s systolic this admission   Hx of VT arrest (2015) with ICD in place - therapies turned off Recurrent VT - VT storm 05/2022, 08/2022,  ICD deactivated 08/2022 HTN - admissions in the past for VT storm - pt  requested therapies for ICD be turned off - upcoming cardiac PET for sarcoidosis scheduled 12/21/23 - no MRI due to [facility] ICD - EP consulted and has recommended amiodarone  and mexiletine - BB was increased to 100 mg toprol  - BB and amiodarone  now complicated by hypotension and elevated LFTs, respectively - given amiodarone 's long half life, could potentially pause, but this decision is complicated by VT   Chronic systolic heart failure with LVEF 30-35% Felt secondary to ICM Volume status managed with HD - GDMT with toprol  - sarcoid evaluation as above   CAD x/p PCI 2007 Hyperlipidemia with LDL goal < 70 - has chronic chest pain - initial troponin low and flat in the 40-50 range (12/10/23) - hs troponin 381 --> 361 ( 12/17/23) - had elevated troponin this admission, could be demand ischemia in the setting of acute illness and VT, but would favor ischemic evaluation given increased VT - plan for PET stress test - can be done OP   Pancreatic mass - initial biopsy negative for malignancy - developed post ERCP pancreatitis - now with rising LFTs, planned for repeat MRCP today   Confusion - pt with AMS since waking up this morning, per RN - RN reports patient had a fall yesterday but no reported injuries - I have reached out to primary to see if an ammonia level +/- CT head would be appropriate   ESRD on HD - per nephrology      For questions or updates, please contact [facility] Please consult [email or web address] for contact info under        Signed, Jon Nat Hails, PA  12/19/2023, 10:26 AM     ATTENDING ATTESTATION:  After conducting a review of all available clinical information with the care team, interviewing the patient, and performing a physical exam, I agree with the findings and plan described in this note.   GEN: No acute distress.  Alert and oriented x 2 HEENT:  MMM, no JVD, no scleral icterus Cardiac: RRR, no murmurs, rubs, or gallops.  Respiratory:  Clear to auscultation bilaterally. GI: Soft, nontender, non-distended  MS: No edema; No deformity. Neuro:  Nonfocal  Vasc:  +2 radial pulses right upper extremity fistula  The patient sustained a fall yesterday.  He remains somewhat hypotensive and mildly confused.  Currently he has been treated with Toprol  and amiodarone  for NSVT.  He has had no VT overnight.  He is to have an MRCP today.  His LFTs continue to increase.  Unfortunately I believe the patient's confusion and hypotension necessitate a medical evaluation.  We have reached out to the primary team regarding head CT and further evaluation.  Victor Red, MD Pager [phone]

## 2023-12-19 NOTE — [place] FL2 (Signed)
 [place]  MEDICAID FL2 LEVEL OF CARE FORM     IDENTIFICATION  [name] Birthdate: [birth year] Sex: male Admission Date (Current Location): 12/10/2023  [place] and [place] Number:  [place] [id number] O Facility and Address:  [facility], [address] [place], [place] [address]      Provider Number: [id number]  Attending Physician Name and Address:  Cindy Garnette POUR, MD  Relative Name and Phone Number:       Current Level of Care: Hospital Recommended Level of Care: Skilled Nursing Facility Prior Approval Number:    Date Approved/Denied:   PASRR Number: [id number]  Discharge Plan: SNF    Current Diagnoses: Patient Active Problem List   Diagnosis Date Noted   Gastritis and gastroduodenitis 12/15/2023   Idiopathic acute pancreatitis without infection or necrosis 12/15/2023   Abdominal pain 12/12/2023   Pancreatic mass 12/10/2023   Hypotension 12/10/2023   Hypokalemia 12/10/2023   Elevated troponin 12/10/2023   History of ventricular tachycardia s/p ICD 12/10/2023   Chest pain 12/10/2023   Epigastric pain 11/25/2023   History of colonic polyps 09/28/2023   Benign neoplasm of colon 09/28/2023   ICD (implantable cardioverter-defibrillator) in place 11/05/2022   Paroxysmal atrial fibrillation (HCC)    Nausea and vomiting    Paroxysmal ventricular tachycardia (HCC) 08/24/2022   VT (ventricular tachycardia) (HCC) 08/16/2022   Chronic obstructive pulmonary disease, unspecified COPD type (HCC) 06/18/2022   AICD discharge 05/25/2022   Seasonal and perennial allergic rhinitis 11/12/2021   Moderate persistent asthma, uncomplicated 11/12/2021   STD exposure 08/27/2021   ESRD (end stage renal disease) (HCC) 02/06/2021   Hypervolemia associated with renal insufficiency 01/12/2021   Angioedema 09/22/2020   Secondary hyperparathyroidism of renal origin (HCC) 07/15/2019   Vitamin D  deficiency 07/15/2019   CAD (coronary artery  disease), native coronary artery 04/10/2019   Chronic midline low back pain without sciatica 02/17/2018   Ventricular tachycardia (HCC) 03/29/2016   Defibrillator discharge    HFrEF (heart failure with reduced ejection fraction) (HCC) 01/22/2016   Erectile dysfunction 01/22/2016   Allergic rhinitis 01/22/2016   GERD (gastroesophageal reflux disease) 01/22/2016   PROTEINURIA 02/17/2010   Dilated cardiomyopathy (HCC) 02/16/2010   Obstructive sleep apnea 02/16/2010   External hemorrhoids 09/26/2009   Tobacco use disorder in remission 06/03/2009   Essential hypertension 10/01/2008   Rectal bleeding 10/01/2008   Hyperlipidemia 07/14/2007   Class 3 severe obesity due to excess calories with serious comorbidity and body mass index (BMI) of 40.0 to 44.9 in adult (HCC) 07/14/2007   Combined congestive systolic and diastolic heart failure (HCC) 07/14/2007   Reactive airway disease 07/14/2007    Orientation RESPIRATION BLADDER Height & Weight     Self  Normal Continent Weight: 96.5 kg Height:  5' 7 (170.2 cm)  BEHAVIORAL SYMPTOMS/MOOD NEUROLOGICAL BOWEL NUTRITION STATUS      Continent Diet (See Discharge Summary)  AMBULATORY STATUS COMMUNICATION OF NEEDS Skin   Extensive Assist Verbally Normal                       Personal Care Assistance Level of Assistance  Bathing, Feeding, Dressing Bathing Assistance: Limited assistance Feeding assistance: Independent Dressing Assistance: Maximum assistance     Functional Limitations Info             SPECIAL CARE FACTORS FREQUENCY  PT (By licensed PT), OT (By licensed OT)     PT Frequency: 5 x per week OT Frequency: 5 x per week  Contractures Contractures Info: Not present    Additional Factors Info  Code Status, Allergies Code Status Info: Full code Allergies Info: Ace Inhibitors, Influenza, Influenza vaccine, Ketorolac, LIdocaine , Lisinopril, Penicillin           Current Medications (12/19/2023):  This is  the current hospital active medication list Current Facility-Administered Medications  Medication Dose Route Frequency Provider Last Rate Last Admin   acetaminophen  (TYLENOL ) tablet 650 mg  650 mg Oral Q6H PRN Sundil, Subrina, MD   650 mg at 12/15/23 9490   Or   acetaminophen  (TYLENOL ) suppository 650 mg  650 mg Rectal Q6H PRN Sundil, Subrina, MD       amiodarone  (PACERONE ) tablet 200 mg  200 mg Oral BID Sundil, Subrina, MD   200 mg at 12/19/23 1108   bisacodyl  (DULCOLAX) suppository 10 mg  10 mg Rectal Daily PRN Fritz Belvie DEL, NP       calcium  carbonate (TUMS - dosed in mg elemental calcium ) chewable tablet 200 mg of elemental calcium   1 tablet Oral TID PRN Chiu, Stephen K, MD   200 mg of elemental calcium  at 12/16/23 0856   Chlorhexidine  Gluconate Cloth 2 % PADS 6 each  6 each Topical Q0600 Geralynn Charleston, MD   6 each at 12/19/23 0600   Chlorhexidine  Gluconate Cloth 2 % PADS 6 each  6 each Topical Q0600 Geralynn Charleston, MD   6 each at 12/19/23 0600   Chlorhexidine  Gluconate Cloth 2 % PADS 6 each  6 each Topical Q0600 Geralynn Charleston, MD   6 each at 12/19/23 0600   docusate sodium  (COLACE) capsule 100 mg  100 mg Oral BID Sundil, Subrina, MD   100 mg at 12/18/23 9095   fluticasone  furoate-vilanterol (BREO ELLIPTA ) 100-25 MCG/ACT 1 puff  1 puff Inhalation Daily Sundil, Subrina, MD   1 puff at 12/14/23 0936   heparin  ADULT infusion 100 units/mL (25000 units/250mL)  850 Units/hr Intravenous Continuous Cindy Garnette POUR, MD 8.5 mL/hr at 12/19/23 1453 850 Units/hr at 12/19/23 1453   hydrOXYzine  (ATARAX ) tablet 25 mg  25 mg Oral TID Ogbata, Sylvester I, MD   25 mg at 12/18/23 2205   lanthanum  (FOSRENOL ) chewable tablet 2,000 mg  2,000 mg Oral TID WC Rosario Eland I, MD   2,000 mg at 12/18/23 1800   And   lanthanum  (FOSRENOL ) chewable tablet 1,000 mg  1,000 mg Oral BID BM Ogbata, Sylvester I, MD   1,000 mg at 12/16/23 1528   levalbuterol  (XOPENEX ) nebulizer solution 0.63 mg  0.63 mg  Nebulization Q6H PRN Sundil, Subrina, MD       metoprolol  succinate (TOPROL -XL) 24 hr tablet 100 mg  100 mg Oral Daily Cindy Garnette POUR, MD   100 mg at 12/18/23 0903   mexiletine (MEXITIL ) capsule 300 mg  300 mg Oral BID Sundil, Subrina, MD   300 mg at 12/18/23 2205   midodrine  (PROAMATINE ) tablet 5 mg  5 mg Oral TID PRN Cindy Garnette POUR, MD   5 mg at 12/19/23 1109   nitroGLYCERIN  (NITROSTAT ) SL tablet 0.4 mg  0.4 mg Sublingual Q5 min PRN Cindy Garnette POUR, MD       ondansetron  (ZOFRAN ) tablet 4 mg  4 mg Oral Q6H PRN Sundil, Subrina, MD   4 mg at 12/14/23 2259   Or   ondansetron  (ZOFRAN ) injection 4 mg  4 mg Intravenous Q6H PRN Sundil, Subrina, MD   4 mg at 12/18/23 1408   Oral care mouth rinse  15 mL Mouth Rinse PRN Cindy,  Garnette POUR, MD       pantoprazole  (PROTONIX ) EC tablet 40 mg  40 mg Oral BID Sundil, Subrina, MD   40 mg at 12/18/23 2205   polyethylene glycol (MIRALAX  / GLYCOLAX ) packet 17 g  17 g Oral BID Cindy Garnette POUR, MD   17 g at 12/18/23 9095   senna (SENOKOT) tablet 8.6 mg  1 tablet Oral Daily PRN Cindy Garnette POUR, MD   8.6 mg at 12/15/23 1647   simethicone  (MYLICON) 40 MG/0.6ML suspension 40 mg  40 mg Oral QID PRN Cindy Garnette POUR, MD   40 mg at 12/15/23 1319   sodium chloride  flush (NS) 0.9 % injection 3 mL  3 mL Intravenous Q12H Sundil, Subrina, MD   3 mL at 12/19/23 1110   sodium chloride  flush (NS) 0.9 % injection 3 mL  3 mL Intravenous PRN Sundil, Subrina, MD         Discharge Medications: Please see discharge summary for a list of discharge medications.  Relevant Imaging Results:  Relevant Lab Results:   Additional Information Metronic ICD, HD patient T, Th, Saturday  Rosaline JONELLE Joe, RN

## 2023-12-19 NOTE — Progress Notes (Signed)
 PHARMACY - ANTICOAGULATION CONSULT NOTE  Pharmacy Consult for Heparin  (Eliquis  on hold) Indication: atrial fibrillation  Allergies  Allergen Reactions   Ace Inhibitors Swelling    Swelling of the tongue   Influenza Vaccines Hives and Swelling    SWELLING REACTION UNSPECIFIED    Ketorolac Swelling    SWELLING REACTION UNSPECIFIED    Lidocaine  Swelling    TONGUE SWELLS   Lisinopril Swelling    TONGUE SWELLING Pt reported problem with a BP med which sounded like  lisinopril  But as of 09/19/06,pt had tolerated altace without problem   Penicillins Swelling    TONGUE SWELLS Has patient had a PCN reaction causing immediate rash, facial/tongue/throat swelling, SOB or lightheadedness with hypotension: Yes Has patient had a PCN reaction causing severe rash involving mucus membranes or skin necrosis: No Has patient had a PCN reaction that required hospitalization No Has patient had a PCN reaction occurring within the last 10 years: Yes If all of the above answers are NO, then may proceed with Cephalosporin use.     Patient Measurements: Height: 5' 7 (170.2 cm) Weight: 96.5 kg (212 lb 11.9 oz) IBW/kg (Calculated) : 66.1 Heparin  Dosing Weight: 89 kg  Vital Signs: Temp: 97.9 F (36.6 C) (01/13 1226) Temp Source: Oral (01/13 0956) BP: 125/90 (01/13 1817) Pulse Rate: 69 (01/13 1817)  Labs: Recent Labs    12/17/23 0706 12/17/23 0929 12/17/23 1135 12/18/23 0653 12/19/23 0809 12/19/23 1805  HGB 11.9*  --   --  11.1* 11.5*  --   HCT 34.3*  --   --  32.2* 33.4*  --   PLT 205  --   --  181 216  --   HEPARINUNFRC 0.34  --   --  0.30 0.29* 0.29*  CREATININE 9.42*  --   --  7.52* 8.98*  --   TROPONINIHS  --  381* 361*  --   --   --     Estimated Creatinine Clearance: 9.7 mL/min (A) (by C-G formula based on SCr of 8.98 mg/dL (H)).  Assessment: 63 yr old male on Eliquis  5 mg BID prior to admission for hx atrial fibrillation.  Eliquis  now held for EUS with FN biopsy of  pancreatic head mass. Pharmacy consulted to restart IV heparin  bridge at midnight 1/9 post procedure  Eliquis  to not restart until at least 48 hours post procedure completed on 1/8  Heparin  level 0.29 just below therapeutic range despite small rate increase to 750 units/hr (for HL 0.30).  Hgb 11.5, pltc 216 - stable.  No issues with infusion and no signs of bleeding noted per RN. Per discussion with provider wants to continue heparin  for now with concerns for potential obstructing stone and elevated troponins.  For MRCP today.  PM Update: Heparin  level remains slightly low at 0.29 after increasing to 850 units/hr.  CBC is stable.   Goal of Therapy:  Heparin  level 0.3-0.7 Monitor platelets by anticoagulation protocol: Yes   Plan:  Increase heparin  to 950 units/hr  Follow-up AM level Heparin  level and CBC daily. Follow up plans to resume Eliquis  after procedures  Thank you for involving pharmacy in the patient's care.   Harlene Boga, PharmD, BCPS, BCCCP Please refer to [facility] for [facility] numbers 12/19/2023  7:25 PM

## 2023-12-19 NOTE — Progress Notes (Signed)
 Received a call from bedside regarding the patient being hypotensive with BP 71/43 MAP 53 after receiving as needed 5 mg of p.o. midodrine .  The patient is on 100 mg of Toprol -XL and amiodarone  200 mg twice daily per cardiology, last dose of Toprol -XL was at 9 AM on 12/18/2023.  A dose of midodrine  10 mg x 1 ordered to be administered to maintain MAP greater than 65.  Cardiology is following.  Will continue to closely monitor and treat as indicated.   No charge note.

## 2023-12-20 ENCOUNTER — Inpatient Hospital Stay (HOSPITAL_COMMUNITY): Payer: 59

## 2023-12-20 ENCOUNTER — Encounter: Payer: Self-pay | Admitting: Physician Assistant

## 2023-12-20 DIAGNOSIS — Z515 Encounter for palliative care: Secondary | ICD-10-CM | POA: Diagnosis not present

## 2023-12-20 DIAGNOSIS — Z66 Do not resuscitate: Secondary | ICD-10-CM

## 2023-12-20 DIAGNOSIS — N186 End stage renal disease: Secondary | ICD-10-CM | POA: Diagnosis not present

## 2023-12-20 DIAGNOSIS — K8689 Other specified diseases of pancreas: Secondary | ICD-10-CM | POA: Diagnosis not present

## 2023-12-20 DIAGNOSIS — I472 Ventricular tachycardia, unspecified: Secondary | ICD-10-CM | POA: Diagnosis not present

## 2023-12-20 DIAGNOSIS — I5042 Chronic combined systolic (congestive) and diastolic (congestive) heart failure: Secondary | ICD-10-CM | POA: Diagnosis not present

## 2023-12-20 DIAGNOSIS — Z792 Long term (current) use of antibiotics: Secondary | ICD-10-CM | POA: Diagnosis not present

## 2023-12-20 DIAGNOSIS — R109 Unspecified abdominal pain: Secondary | ICD-10-CM | POA: Diagnosis not present

## 2023-12-20 DIAGNOSIS — R079 Chest pain, unspecified: Secondary | ICD-10-CM | POA: Diagnosis not present

## 2023-12-20 DIAGNOSIS — K8500 Idiopathic acute pancreatitis without necrosis or infection: Secondary | ICD-10-CM | POA: Diagnosis not present

## 2023-12-20 LAB — CBC
HCT: 30.2 % — ABNORMAL LOW (ref 39.0–52.0)
Hemoglobin: 10.7 g/dL — ABNORMAL LOW (ref 13.0–17.0)
MCH: 29.8 pg (ref 26.0–34.0)
MCHC: 35.4 g/dL (ref 30.0–36.0)
MCV: 84.1 fL (ref 80.0–100.0)
Platelets: 228 10*3/uL (ref 150–400)
RBC: 3.59 MIL/uL — ABNORMAL LOW (ref 4.22–5.81)
RDW: 17.6 % — ABNORMAL HIGH (ref 11.5–15.5)
WBC: 9.5 10*3/uL (ref 4.0–10.5)
nRBC: 0 % (ref 0.0–0.2)

## 2023-12-20 LAB — COMPREHENSIVE METABOLIC PANEL
ALT: 237 U/L — ABNORMAL HIGH (ref 0–44)
AST: 305 U/L — ABNORMAL HIGH (ref 15–41)
Albumin: 1.7 g/dL — ABNORMAL LOW (ref 3.5–5.0)
Alkaline Phosphatase: 189 U/L — ABNORMAL HIGH (ref 38–126)
Anion gap: 14 (ref 5–15)
BUN: 50 mg/dL — ABNORMAL HIGH (ref 6–20)
CO2: 23 mmol/L (ref 22–32)
Calcium: 8.8 mg/dL — ABNORMAL LOW (ref 8.9–10.3)
Chloride: 95 mmol/L — ABNORMAL LOW (ref 98–111)
Creatinine, Ser: 10.96 mg/dL — ABNORMAL HIGH (ref 0.61–1.24)
GFR, Estimated: 5 mL/min — ABNORMAL LOW (ref >60)
Glucose, Bld: 80 mg/dL (ref 70–99)
Potassium: 4.6 mmol/L (ref 3.5–5.1)
Sodium: 132 mmol/L — ABNORMAL LOW (ref 135–145)
Total Bilirubin: 1.8 mg/dL — ABNORMAL HIGH (ref 0.0–1.2)
Total Protein: 5.7 g/dL — ABNORMAL LOW (ref 6.5–8.1)

## 2023-12-20 LAB — HEPARIN LEVEL (UNFRACTIONATED)
Heparin Unfractionated: 0.18 [IU]/mL — ABNORMAL LOW (ref 0.30–0.70)
Heparin Unfractionated: 0.28 [IU]/mL — ABNORMAL LOW (ref 0.30–0.70)

## 2023-12-20 LAB — SURGICAL PATHOLOGY

## 2023-12-20 NOTE — Progress Notes (Signed)
  [name] Date of Encounter: 12/20/2023  Primary Cardiologist: Ezra Shuck, MD Electrophysiologist: Danelle Birmingham, MD  Interval Summary   Became agitated overnight, removed periph IV Remains drowsy and somewhat confused this AM  He denies chest pain, chest pressure, palpitaitons   Vital Signs    Vitals:   12/19/23 1817 12/19/23 2039 12/19/23 2100 12/20/23 0048  BP: (!) 125/90 91/65  (!) 144/92  Pulse: 69 70 73 70  Resp:  19 18 18   Temp:  98.3 F (36.8 C)  97.6 F (36.4 C)  TempSrc:  Oral  Oral  SpO2:  90% 94% 94%  Weight:      Height:       No intake or output data in the 24 hours ending 12/20/23 0757 Filed Weights   12/17/23 1416 12/18/23 0356 12/18/23 1000  Weight: 97.4 kg 103.3 kg 96.5 kg    Physical Exam    GEN- The patient is alert, confused. Sleeping with CPAP mask on forehead without oxygen  hooked up  Lungs- Clear to ausculation bilaterally, normal work of breathing Cardiac- Regular rate and rhythm, no murmurs, rubs or gallops GI- soft, NT, ND, + BS Extremities- no clubbing or cyanosis. No edema  Telemetry    SR with intermittent worsening LBBB  Limited tele available for review   (personally reviewed)  Hospital Course    Victor Thompson is a 63 y.o. male with a past medical history notable for CAD (PCI 2007), chronic systolic heart failure secondary to ICM w/hx of VT arrest and ICD (at [facility] center in 2015), recurrent ventricular tachycardia, dismal atrial fibrillation, HTN, HLD, ESRD on HD who is currently admitted with pancreatitis.   ICD therapies are currently off  Assessment & Plan    #) VT #) NSTEMI #) ICM s/p ICD with therapies off #) HFrEF #) parox AFib #) elevated LFTs  VT quiescent  Continue amiodarone  200mg  BID. Risk/benefit of continuing amio vs elevated LFTs favor continuing medication Continue mexiletine 300mg  BID Continue toprol  100mg  daily at this time Not a candidate for advanced EP procedures.   Consider palliative care with GOC discussion ICD therapies remain off  #) hypotension Can consider IVF for hypotension vs lactate and RHC if concerned for low-output HF. Will defer to primary team +/- gen cards for further eval Do not favor midodrine  as long-term strategy for managing hypotension  #) Pancreatitis managed by primary team #) ESRD on HD - Fluid status managed by dialysis         For questions or updates, please contact [facility] Please consult [email or web address] for contact info under Cardiology/STEMI.  Signed, Chantal Needle, NP  12/20/2023, 7:57 AM

## 2023-12-20 NOTE — Progress Notes (Signed)
 Progress Note   LOS: 10 days   Chief Complaint: pancreatic head mass    Subjective   Denies any abdominal pain.  Patient refused to go down for MRI so patient got a CT pancreas protocol instead.   Objective   Vital signs in last 24 hours: Temp:  [97.6 F (36.4 C)-98.3 F (36.8 C)] 97.6 F (36.4 C) (01/14 0048) Pulse Rate:  [68-73] 70 (01/14 0048) Resp:  [17-19] 18 (01/14 0048) BP: (84-144)/(32-92) 144/92 (01/14 0048) SpO2:  [90 %-96 %] 94 % (01/14 0048) FiO2 (%):  [21 %] 21 % (01/13 2100) Last BM Date : 12/17/22 Last BM recorded by nurses in past 5 days Stool Type: Type 7 (Liquid consistency with no solid pieces) (12/18/2023  2:00 PM)  General:   male in no acute distress  Heart:  Regular rate and rhythm; no murmurs Pulm: Clear anteriorly; no wheezing Abdomen: soft, nondistended, normal bowel sounds in all quadrants. Nontender without guarding. No organomegaly appreciated. Extremities:  No edema Neurologic:  Alert and  oriented x4;  No focal deficits.  Psych:  Cooperative. Normal mood and affect.  Intake/Output from previous day: No intake/output data recorded. Intake/Output this shift: No intake/output data recorded.  Studies/Results: CT ABDOMEN PELVIS W CONTRAST Result Date: 12/19/2023 CLINICAL DATA:  pancreas lesion EXAM: CT ABDOMEN AND PELVIS WITH CONTRAST TECHNIQUE: Multidetector CT imaging of the abdomen and pelvis was performed using the standard protocol following bolus administration of intravenous contrast. RADIATION DOSE REDUCTION: This exam was performed according to the departmental dose-optimization program which includes automated exposure control, adjustment of the mA and/or kV according to patient size and/or use of iterative reconstruction technique. CONTRAST:  75mL OMNIPAQUE  IOHEXOL  350 MG/ML SOLN COMPARISON:  CT abdomen pelvis 12/10/2023 FINDINGS: Lower chest: Interval development of bilateral lower lobe atelectasis. Interval development of left pleural  effusion. Cardiomegaly. Hepatobiliary: No focal liver abnormality. Hyperdensity of the gallbladder lumen consistent with vicarious excretion of previously administered intravenous contrast. No gallstones, gallbladder wall thickening, or pericholecystic fluid. No biliary dilatation. Pancreas: Redemonstration of a large heterogeneous hypodense mass along the pancreatic head measuring up to at least 5 cm (2:35). Interval increase in size and conspicuity of a fluid dense lesion of the distal pancreatic body measuring up to 2.4 x 1.4 cm (from 1.6 x 1.2 cm). Interval development of a hazy contour of the pancreatic parenchyma with associated peripancreatic fat stranding and simple free fluid. No definite main pancreatic ductal dilatation. Spleen: Normal in size without focal abnormality. Adrenals/Urinary Tract: No adrenal nodule bilaterally. A trophic kidneys.  Bilateral kidneys enhance symmetrically. No hydronephrosis. No hydroureter. The urinary bladder is decompressed and grossly unremarkable. No excretion of intravenous contrast of either kidneys on delayed view. Stomach/Bowel: Inspissated PO contrast within the bowel. Stomach is within normal limits. No evidence of bowel wall thickening or dilatation. Appendix appears normal. Vascular/Lymphatic: No abdominal aorta or iliac aneurysm. Moderate atherosclerotic plaque of the aorta and its branches. Persistent upper abdominal lymphadenopathy with slightly less conspicuous appearance due to surrounding inflammatory changes. No pelvic or inguinal lymphadenopathy. Reproductive: Prostate is unremarkable. Other: Interval development of trace simple free fluid within the abdomen. No intraperitoneal free gas. No organized fluid collection. Musculoskeletal: No abdominal wall hernia or abnormality. No suspicious lytic or blastic osseous lesions. No acute displaced fracture. Multilevel degenerative changes of the spine. IMPRESSION: 1. Interval development of acute pancreatitis. 2.  Redemonstration of a 5 cm pancreatic head mass with persistent upper abdominal lymphadenopathy concerning for metastatic malignancy. Recommend further evaluation with  MRI abdomen with and without contrast. 3. Interval increase in size and conspicuity of a fluid dense lesion of the distal pancreatic body measuring up to 2.4 x 1.4 cm. Finding could represent pseudocyst versus focal pancreatic duct dilatation. 4. Interval development of left pleural effusion. 5. Atrophic kidneys with no excretion of intravenous contrast on delayed view from either kidneys. Vicarious excretion of previously administered intravenous contrast from the gallbladder. Correlate with renal function tests. Electronically Signed   By: Morgane  Naveau M.D.   On: 12/19/2023 21:25   CT HEAD WO CONTRAST ( ) Result Date: 12/19/2023 CLINICAL DATA:  Head trauma.  Moderate-severe. EXAM: CT HEAD WITHOUT CONTRAST TECHNIQUE: Contiguous axial images were obtained from the base of the skull through the vertex without intravenous contrast. RADIATION DOSE REDUCTION: This exam was performed according to the departmental dose-optimization program which includes automated exposure control, adjustment of the mA and/or kV according to patient size and/or use of iterative reconstruction technique. COMPARISON:  12/31/2018 FINDINGS: Brain: The brain shows a normal appearance without evidence of malformation, atrophy, old or acute small or large vessel infarction, mass lesion, hemorrhage, hydrocephalus or extra-axial collection. Vascular: There is atherosclerotic calcification of the major vessels at the base of the brain. Skull: Normal.  No traumatic finding.  No focal bone lesion. Sinuses/Orbits: Sinuses are clear. Orbits appear normal. Mastoids are clear. Other: None significant IMPRESSION: No acute or traumatic finding. Normal appearance of the brain itself. Atherosclerotic calcification of the major vessels at the base of the brain. Electronically Signed    By: Oneil Officer M.D.   On: 12/19/2023 14:12    Lab Results: Recent Labs    12/18/23 0653 12/19/23 0809  WBC 11.3* 9.1  HGB 11.1* 11.5*  HCT 32.2* 33.4*  PLT 181 216   BMET Recent Labs    12/18/23 0653 12/19/23 0809  NA 134* 132*  K 4.1 4.3  CL 95* 92*  CO2 27 26  GLUCOSE 86 95  BUN 26* 36*  CREATININE 7.52* 8.98*  CALCIUM  9.0 9.2   LFT Recent Labs    12/19/23 0809  PROT 6.4*  ALBUMIN  1.9*  AST 452*  ALT 295*  ALKPHOS 194*  BILITOT 1.7*   PT/INR No results for input(s): LABPROT, INR in the last 72 hours.   Scheduled Meds:  amiodarone   200 mg Oral BID   Chlorhexidine  Gluconate Cloth  6 each Topical Q0600   Chlorhexidine  Gluconate Cloth  6 each Topical Q0600   Chlorhexidine  Gluconate Cloth  6 each Topical Q0600   docusate sodium   100 mg Oral BID   fluticasone  furoate-vilanterol  1 puff Inhalation Daily   hydrOXYzine   25 mg Oral TID   lanthanum   2,000 mg Oral TID WC   And   lanthanum   1,000 mg Oral BID BM   metoprolol  succinate  100 mg Oral Daily   mexiletine  300 mg Oral BID   pantoprazole   40 mg Oral BID   polyethylene glycol  17 g Oral BID   sodium chloride  flush  3 mL Intravenous Q12H   Continuous Infusions:  heparin  950 Units/hr (12/19/23 2041)      Patient profile:   63 year old male with history of CHF status post AICD, CAD, end-stage renal disease on dialysis, sleep apnea presents with epigastric discomfort shortness of breath and chest pain, found to have a CT scan with a pancreas head mass with surrounding LAN and possible hypodense hepatic lesion.    Impression:   Pancreatic head mass with LAN and elevated LFTs  concerning for primary malignancy  Pancreatitis EGD/EUS 1/8 showed white nummular lesions in esophageal mucosa, gastritis, prominent major and minor papilla, single duodenal polyp.  Masslike region in the superior pancreatic head that was biopsied. Pathology from EUS showed changes compatible with chronic pancreatitis and was  negative for malignancy. Gastric biopsies showed gastropathy that was negative for H pylori. 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 Duodenal polyp was benign.  Patient refused to go down for MRI so we opted for CT pancreas protocol instead.  CT pancreas protocol again showed a 5 cm pancreatic head mass with persistent upper abdominal lymphadenopathy concerning for metastatic malignancy.  Patient also has signs of pancreatitis and increased interval size in size of a fluid density lesion in the distal pancreatic body that could represent a pseudocyst or focal pancreatic duct dilation. Patient currently denies any abdominal pain.  Patient is open to getting another EUS procedure to get biopsies of his pancreatic head mass   Post EUS pancreatitis with possible pancreatic pseudocyst EUS 1/8 now with worsening upper abdominal pain.   Patient's lipase was elevated a few days ago and subsequently down trended.     Normocytic anemia Hgb stable No overt GI bleeding Potentially secondary to CKD/anemia of chronic disease -Continue to trend hemoglobin   CHF EF 30%, history of AICD Echo with EF 30 to 35% grade 1 diastolic mild MR no aortic stenosis   Nonobstructive CAD with elevated troponins .No chest pain currently, most recent troponin 47 and stable   End-stage renal disease  on dialysis 3 times weekly     Plan:   - Plan for repeat EUS with FNA tomorrow.  N.p.o. after midnight.  Patient's heparin  drip will need to be stopped 6 hours before his procedure.  I have placed a pharmacy consult order to help with stopping his heparin .  Rosario JAYSON Kidney MD 12/20/2023, 10:42 AM

## 2023-12-20 NOTE — Progress Notes (Signed)
 Patient continues to be confused this AM. Slept some throughout the night. Refused CHG bath, medication, and labs this morning. unable to redirect

## 2023-12-20 NOTE — Progress Notes (Signed)
 Pt receives out-pt HD at [facility] [place] on TTS 5:30 am chair time. Will assist as needed.   Olivia Canter Renal Navigator [phone]

## 2023-12-20 NOTE — Progress Notes (Signed)
 Progress Note   Patient: Victor Thompson FMW:[id number] DOB: [birth year] DOA: 12/10/2023     10 DOS: the patient was seen and examined on 12/20/2023   Brief hospital course: 63 year old African-American male, with past medical history significant for ESRD on HD TTS schedule, ischemic cardiomyopathy, congestive heart failure with reduced EF (25 to 30%), history of CAD, VT storm status post ICD, currently on Eliquis  and amiodarone , reactive airway disease and hyperlipidemia presented with epigastric pain, shortness of breath, cough and chest pain.  Further workup in the ED showed pancreatic mass concerning for malignancy.  Patient was transferred to [facility] as patient will likely need endoscopic ultrasound guided biopsy of pancreatic mass.  GI is following him.   12/14/2023:  EUS guided biopsy of the pancreatic mass planned for today.  On heparin  drip.  Nephrology on board for Hemodialysis needs.  Assessment and Plan: Pancreatic Mass-  GI follow up appreciated. Pt now s/p EUS on 1/8 with biopsy consistent with chronic pancreatitis, does not appear malignant -LFT's had been trending up post-procedure -Follow up MRCP was recommended, however pt refused. Now plan for EUS with FNA tomorrow per GI   Essential Hypertension Recently hypotensive requiring PRN midodrine  Cardiology following  -had placed hold parameters on metoprolol  that was started by EP -Pt refused metoprolol  today   Combined systolic and diastolic CHF: Continued metoprolol  with hold parameters, bidil  was discontinued No evidence of acute CHF exacerbation.   H/o ventricular tachycardia s/p ICD. Continue Amiodarone , metoprolol , mexiletine Earlier pt continued to have runs of VT here, asymptomatic Per records, pt had elected to disable ICD in the past, does not want it on again EP and Cardiology following  Elevated trop -Suspect secondary to demand from recurrent VT -Pt is continued on heparin  gtt -Cardiology  following   Reactive airway disease- COVID, flu and RSV negative.   Chest x-ray unremarkable.  CTA chest unremarkable. Continue with Breo Ellipta  twice daily and Xopenex  every 6 hour as needed for any wheezing shortness of breath   ESRD on dialysis TTS schedule Last dialysis 12/12/22 with 3.5L removed. No sob. Nephrology on board for HD   Obesity, Class II BMI 36.64 Diet, exercise and weight reduction advised. Continue CPAP at night for OSA.  Constipation -Continue with scheduled cathartics  Post-ercp pancreatitis -Lipase peaked to over 500's, later improved -LFT's had trended up. Now showing downtrend -Pt refused MRCP. Now plan for EUS with FNA 1/15  End of Life -Palliative Care consulted, wishes confirmed to be DNR -On-going goals of care  Toxic metabolic encephalopathy -Unclear etiology, but suspect may be related to hypotension recently -Head CT reviewed, neg for acute process -Ordered CXR and blood cx to r/o infectious causes, however pt refused CXR and blood draws this AM -Ammonia was normal   Subjective: Feeling frustrated this AM. Refusing CXR and blood draws  Physical Exam: Vitals:   12/19/23 2039 12/19/23 2100 12/20/23 0048 12/20/23 1155  BP: 91/65  (!) 144/92 115/69  Pulse: 70 73 70 69  Resp: 19 18 18  (!) 22  Temp: 98.3 F (36.8 C)  97.6 F (36.4 C)   TempSrc: Oral  Oral   SpO2: 90% 94% 94% 96%  Weight:      Height:       General exam: Awake, laying in bed, in nad Respiratory system: Normal respiratory effort, no wheezing Cardiovascular system: regular rate, s1, s2 Gastrointestinal system: Soft, nondistended, positive BS Central nervous system: CN2-12 grossly intact, strength intact Extremities: Perfused, no clubbing Skin: Normal  skin turgor, no notable skin lesions seen Psychiatry: Mood normal // no visual hallucinations   Data Reviewed:  There are no new results to review at this time.  Family Communication: Pt in room, family not at bedside.  Did discuss with pt's wife over phone on 1/13  Disposition: Status is: Inpatient Remains inpatient appropriate because: severity of illness  Planned Discharge Destination: Rehab    Author: Garnette Pelt, MD 12/20/2023 12:56 PM  For on call review [email or web address].

## 2023-12-20 NOTE — Progress Notes (Signed)
 Patient has continued to be confused throughout the night. Removed IV and continues to remove telemetry. He is uncertain to time, place, or situation. Unable to redirect most of the time. Provider notified.

## 2023-12-20 NOTE — Progress Notes (Signed)
 [facility] Progress Note  Subjective: Seen in room Confused, expresses vague feelings of distrust and paranoia Refuses physical exam Still plan is for MRCP  Vitals:   12/19/23 1817 12/19/23 2039 12/19/23 2100 12/20/23 0048  BP: (!) 125/90 91/65  (!) 144/92  Pulse: 69 70 73 70  Resp:  19 18 18   Temp:  98.3 F (36.8 C)  97.6 F (36.4 C)  TempSrc:  Oral  Oral  SpO2:  90% 94% 94%  Weight:      Height:        Exam: NAD, confused Paranoid affect Refuses remaining exam       Renal-related home meds: - eliquis  5 bid - coreg  6.25 bid, bidil  20-37.5 bid - fosrenol  2 gm ac tid - others: mexiletine 300 bid, PPI, symbicort , statin, amiodarone       [facility]   4h   450 /1.5  102kg  2/2 bath   AVF   Heparin  3000 - hect 9 micrograms three times per week - no esa   CXR 1/04 on admission - no acute disease   Assessment/ Plan: Pancreatic mass - w/ abd pain, CT +for mass a head of pancreas, also multiple local enlarged LN's. SP EUS on 1/08 w/ biopsy appearing not malignant and more c/w with chronic pancreatitis. For MRCP today given inc LFTs  ESKD - on HD TTS. Next HD 1/14.  HTN - BP's soft. DC'd bidil  for now. On MTP and prn midodrine ; per cardiology 2/2 #8; reduce dose of MTP? Volume - CXR negative. Under outpt EDW, adjust down at DC.  Gentle UF moving forward Anemia of eskd - Hb 11s, not on esa at OP unit. Follow.  Secondary hyperparathyroidism - CCa in range. Cont binders w/ meals.  H/o combined heart failure H/o VT storm/ ischemic CM/ PAF - sp ICD and takes mexilitene, BB and amiodarone . Cardiology is following. Goal for Cardiac PET 1/15  Given current cardiac, GI, and ongoing ESRD seems would be reasonable to involve palliative for GOC.  Poor prognosis.   Victor KATHEE Gasman, MD  12/20/2023, 10:28 AM  Recent Labs  Lab 12/18/23 [phone] 12/19/23 0809  HGB 11.1* 11.5*  ALBUMIN  1.8* 1.9*  CALCIUM  9.0 9.2  CREATININE 7.52* 8.98*  K 4.1 4.3   No  results for input(s): IRON, TIBC, FERRITIN in the last 168 hours.  Inpatient medications:  amiodarone   200 mg Oral BID   Chlorhexidine  Gluconate Cloth  6 each Topical Q0600   Chlorhexidine  Gluconate Cloth  6 each Topical Q0600   Chlorhexidine  Gluconate Cloth  6 each Topical Q0600   docusate sodium   100 mg Oral BID   fluticasone  furoate-vilanterol  1 puff Inhalation Daily   hydrOXYzine   25 mg Oral TID   lanthanum   2,000 mg Oral TID WC   And   lanthanum   1,000 mg Oral BID BM   metoprolol  succinate  100 mg Oral Daily   mexiletine  300 mg Oral BID   pantoprazole   40 mg Oral BID   polyethylene glycol  17 g Oral BID   sodium chloride  flush  3 mL Intravenous Q12H    heparin  950 Units/hr (12/19/23 2041)   acetaminophen  **OR** acetaminophen , bisacodyl , calcium  carbonate, levalbuterol , midodrine , nitroGLYCERIN , ondansetron  **OR** ondansetron  (ZOFRAN ) IV, mouth rinse, senna, simethicone , sodium chloride  flush

## 2023-12-20 NOTE — Progress Notes (Signed)
   12/20/23 1715  Vitals  Temp 98.3 F (36.8 C)  Pulse Rate 67  Resp (!) 22  BP 92/63  SpO2 100 %  O2 Device Room Air  Weight 97.4 kg  Type of Weight Post-Dialysis  Oxygen  Therapy  Patient Activity (if Appropriate) In bed  Pulse Oximetry Type Continuous  Oximetry Probe Site Changed No  Post Treatment  Dialyzer Clearance Lightly streaked  Hemodialysis Intake (mL) 0 mL  Liters Processed 72  Fluid Removed (mL) 1300 mL  Tolerated HD Treatment Yes  AVG/AVF Arterial Site Held (minutes) 10 minutes  AVG/AVF Venous Site Held (minutes) 10 minutes   Received patient in bed to unit.  Alert and oriented.  Informed consent signed and in chart.   TX duration:3hrs  Patient tolerated well.  Transported back to the room  Alert, without acute distress.  Hand-off given to patient's nurse.   Access used: RAVF Access issues: none  Total UF removed: 1.3L Medication(s) given: midodrine    Na'Shaminy T Dijon [facility]

## 2023-12-20 NOTE — Progress Notes (Signed)
 PHARMACY - ANTICOAGULATION CONSULT NOTE  Pharmacy Consult for Heparin  (Eliquis  on hold) Indication: atrial fibrillation  Allergies  Allergen Reactions   Ace Inhibitors Swelling    Swelling of the tongue   Influenza Vaccines Hives and Swelling    SWELLING REACTION UNSPECIFIED    Ketorolac Swelling    SWELLING REACTION UNSPECIFIED    Lidocaine  Swelling    TONGUE SWELLS   Lisinopril Swelling    TONGUE SWELLING Pt reported problem with a BP med which sounded like  lisinopril  But as of 09/19/06,pt had tolerated altace without problem   Penicillins Swelling    TONGUE SWELLS Has patient had a PCN reaction causing immediate rash, facial/tongue/throat swelling, SOB or lightheadedness with hypotension: Yes Has patient had a PCN reaction causing severe rash involving mucus membranes or skin necrosis: No Has patient had a PCN reaction that required hospitalization No Has patient had a PCN reaction occurring within the last 10 years: Yes If all of the above answers are NO, then may proceed with Cephalosporin use.     Patient Measurements: Height: 5' 7 (170.2 cm) Weight: 97.4 kg (214 lb 11.7 oz) IBW/kg (Calculated) : 66.1 Heparin  Dosing Weight: 89 kg  Vital Signs: Temp: 97.9 F (36.6 C) (01/14 2016) Temp Source: Oral (01/14 2016) BP: 98/69 (01/14 2016) Pulse Rate: 73 (01/14 2016)  Labs: Recent Labs    12/18/23 0653 12/19/23 0809 12/19/23 1805 12/20/23 1404 12/20/23 2236  HGB 11.1* 11.5*  --  10.7*  --   HCT 32.2* 33.4*  --  30.2*  --   PLT 181 216  --  228  --   HEPARINUNFRC 0.30 0.29* 0.29* 0.18* 0.28*  CREATININE 7.52* 8.98*  --  10.96*  --     Estimated Creatinine Clearance: 8 mL/min (A) (by C-G formula based on SCr of 10.96 mg/dL (H)).  Assessment: 63 yr old male on Eliquis  5 mg BID prior to admission for hx atrial fibrillation.  Eliquis  now held for EUS with FN biopsy of pancreatic head mass.  Pharmacy consulted to restart IV heparin  bridge at midnight 1/9 post  procedure.  Continuing on heparin  with ongoing procedures.    Heparin  level 0.28 is subtherapeutic on 1200 units/hr. Hgb 10.7, pltc 228. Per RN, no issues with the heparin  running continuously or signs/symptoms of bleeding.   For EUS tomorrow @1430p .  Consult placed to hold 8h before procedure.  Goal of Therapy:  Heparin  level 0.3-0.7 Monitor platelets by anticoagulation protocol: Yes   Plan:  Increase heparin  to 1300 units/hr  Stop heparin  @0630  (entered) Follow up plans to resume Eliquis  after procedures  Thank you for involving pharmacy in the patient's care.   Lynwood Poplar, PharmD. Clinical Pharmacist 12/20/2023 11:34 PM

## 2023-12-20 NOTE — Consult Note (Signed)
 Consultation Note Date: 12/20/2023   [name]  DOB: [birth year]  MRN: [id number]  Age / Sex: 63 y.o., male  PCP: [name], MD Referring Physician: Cindy Garnette POUR, MD  Reason for Consultation: Establishing goals of care  HPI/Patient Profile: 63 y.o. male   admitted on 12/10/2023 with  medical history significant of ESRD on HD TTS schedule, ischemic cardiomyopathy, congestive heart reduced EF 25 to 30%, history of CAD, VT storm status post ICD currently on Eliquis  and amiodarone , reactive airway disease and hyperlipidemia but emergency department complaining of epigastric pain, shortness of breath, cough and chest pain.    Further workup in the ED showed pancreatic mass concerning for malignancy.   Biopsy 12/14/2023 does not appear to be malignant, consistent with chronic pancreatitis  Hospitalization complicated by altered mental status  Patient and his family face treatment option decisions, advanced directive decisions and anticipatory care needs.  Clinical Assessment and Goals of Care:  This NP Ronal Plants reviewed medical records, received report from team, assessed the patient and then meet at the patient's bedside  to discuss diagnosis, prognosis, GOC, EOL wishes disposition and options.         Currently patient is refusing medications, lab draws and recommended chest x-ray.   He is oriented to person and  place however his conversation is set in paranoia that people are trying to do things to him that are harmful, people are talking about him saying things that I did not do!! .  Initially I was able to speak to Gene Spiegel (she  is his fiance, not a spouse,  they do not live together), I also spoke by phone  to patient's mother/ Anuj Summons (she is next of kin and main decision maker in the event that he cannot  make decisions for himself ) and also on the phone call  was his niece    Detailed education offered to all above contacts.  Education offered on his complex medical situation of multiple comorbidities.  Education offered on the limitations of medical interventions to prolong quality of life when the body fails to thrive.   Education offered on the concept of Palliative Care was introduced as specialized medical care for people and their families living with serious illness.  If focuses on providing relief from the symptoms and stress of a serious illness.  The goal is to improve quality of life for both the patient and the family.  Values and goals of care important to patient and family were attempted to be elicited.  All family members verbalize understanding of the seriousness of his current medical situation, his mother states he has suffered for so long  A  discussion was had today regarding advanced directives.  Concepts specific to code status, artifical feeding and hydration, continued IV antibiotics and rehospitalization was had.            Today family made decision for DNR/DNI status.  I will place order.  The difference between a aggressive medical intervention  path  and a palliative comfort care path for this patient at this time was had.       Questions and concerns addressed.  Family encouraged to call with questions or concerns.     PMT will continue to support holistically.             NEXT OF KIN    SUMMARY OF RECOMMENDATIONS    Code Status/Advance Care Planning: DNR/DNI-documented today after detailed conversation and education to family.  All 3 support persons today support DNR/DNI.   Palliative Prophylaxis:  Bowel Regimen, Delirium Protocol, and Frequent Pain Assessment  Additional Recommendations (Limitations, Scope, Preferences): Full Scope Treatment  Psycho-social/Spiritual:  Desire for further Chaplaincy support:no Additional Recommendations: Education on Hospice  Prognosis:  Unable to  determine  Discharge Planning: To Be Determined      Primary Diagnoses: Present on Admission:  Pancreatic mass  Combined congestive systolic and diastolic heart failure (HCC)  ESRD (end stage renal disease) (HCC)  Reactive airway disease   I have reviewed the medical record, interviewed the patient and family, and examined the patient. The following aspects are pertinent.  Past Medical History:  Diagnosis Date   Acute on chronic systolic CHF (congestive heart failure) (HCC) 06/14/2020   Acute respiratory failure (HCC) 05/28/2022   AICD (automatic cardioverter/defibrillator) present    2015   Allergy     Asthma    no meds   Cardiac arrest (HCC) 2015   Chest pain 05/25/2022   CHF (congestive heart failure) (HCC)    Coronary artery disease    ESRD on hemodialysis (HCC)    ckd -stage 5   Hyperlipidemia    Hypertension    Myocardial infarction (HCC)    Obesity    Pneumonia    Shortness of breath dyspnea    Sleep apnea    USES CPAP   Wears glasses    Social History   Socioeconomic History   Marital status: Single    Spouse name: Not on file   Number of children: 3   Years of education: Not on file   Highest education level: Not on file  Occupational History   Occupation: retired  Tobacco Use   Smoking status: Never   Smokeless tobacco: Never  Vaping Use   Vaping status: Never Used  Substance and Sexual Activity   Alcohol use: No   Drug use: No   Sexual activity: Yes  Other Topics Concern   Not on file  Social History Narrative   ** Merged History Encounter **       Social Drivers of Health   Financial Resource Strain: Low Risk  (12/09/2023)   Overall Financial Resource Strain (CARDIA)    Difficulty of Paying Living Expenses: Not hard at all  Food Insecurity: No Food Insecurity (12/11/2023)   Hunger Vital Sign    Worried About Running Out of Food in the Last Year: Never true    Ran Out of Food in the Last Year: Never true  Transportation Needs: No  Transportation Needs (12/11/2023)   PRAPARE - Administrator, Civil Service (Medical): No    Lack of Transportation (Non-Medical): No  Physical Activity: Inactive (02/17/2023)   Exercise Vital Sign    Days of Exercise per Week: 0 days    Minutes of Exercise per Session: 0 min  Stress: No Stress Concern Present (02/17/2023)   [employer] of Occupational Health - Occupational Stress Questionnaire    Feeling of Stress : Only a little  Social  Connections: Not on file   Family History  Problem Relation Age of Onset   Hypertension Mother    Liver disease Father    Colon cancer Neg Hx    Esophageal cancer Neg Hx    Rectal cancer Neg Hx    Stomach cancer Neg Hx    Colon polyps Neg Hx    Scheduled Meds:  amiodarone   200 mg Oral BID   Chlorhexidine  Gluconate Cloth  6 each Topical Q0600   Chlorhexidine  Gluconate Cloth  6 each Topical Q0600   Chlorhexidine  Gluconate Cloth  6 each Topical Q0600   docusate sodium   100 mg Oral BID   fluticasone  furoate-vilanterol  1 puff Inhalation Daily   hydrOXYzine   25 mg Oral TID   lanthanum   2,000 mg Oral TID WC   And   lanthanum   1,000 mg Oral BID BM   metoprolol  succinate  100 mg Oral Daily   mexiletine  300 mg Oral BID   pantoprazole   40 mg Oral BID   polyethylene glycol  17 g Oral BID   sodium chloride  flush  3 mL Intravenous Q12H   Continuous Infusions:  heparin  950 Units/hr (12/19/23 2041)   PRN Meds:.acetaminophen  **OR** acetaminophen , bisacodyl , calcium  carbonate, levalbuterol , midodrine , nitroGLYCERIN , ondansetron  **OR** ondansetron  (ZOFRAN ) IV, mouth rinse, senna, simethicone , sodium chloride  flush Medications Prior to Admission:  Prior to Admission medications   Medication Sig Start Date End Date Taking? Authorizing Provider  albuterol  (PROVENTIL ) (2.5 MG/3ML) 0.083% nebulizer solution INHALE THE CONTENTS OF 1 VIAL BY MOUTH EVERY 6 HOURS AS NEEDED 10/04/23  Yes [name], MD  albuterol  (VENTOLIN  HFA) 108 (90  Base) MCG/ACT inhaler INHALE TWO PUFFS BY MOUTH EVERY 4 HOURS AS NEEDED 04/14/23  Yes [name], MD  ALLERGY  RELIEF CETIRIZINE  10 MG tablet Take 10 mg by mouth 2 (two) times daily. 08/23/23  Yes [provider]  AMBULATORY NON FORMULARY MEDICATION Medication Name: diltiazem 2% / lidocaine  5% ointment - pea sized amount PR TID 09/28/23  Yes Armbruster, Elspeth SQUIBB, MD  apixaban  (ELIQUIS ) 5 MG TABS tablet NEW PRESCRIPTION REQUEST: Eliquis  5 Mg - TAKE ONE TABLET BY MOUTH TWICE DAILY 10/03/23  Yes Rolan Ezra RAMAN, MD  atorvastatin  (LIPITOR ) 80 MG tablet Take 1 tablet (80 mg total) by mouth at bedtime. 11/08/23 11/07/24 Yes Rolan Ezra RAMAN, MD  budesonide -formoterol  (SYMBICORT ) 80-4.5 MCG/ACT inhaler INHALE TWO PUFFS BY MOUTH TWICE DAILY. 11/23/23  Yes [name], MD  Cholecalciferol  125 MCG (5000 UT) TABS Take 1 tablet (5,000 Units total) by mouth daily. 12/01/22  Yes [name], MD  docusate sodium  (COLACE) 100 MG capsule TAKE ONE CAPSULE BY MOUTH DAILY 07/27/23  Yes [name], MD  EPINEPHrine  0.3 mg/0.3 mL IJ SOAJ injection Inject 0.3 mg into the muscle once as needed for up to 2 doses (if worsening tongue swelling, SOB, hypoxia, or other concerns for progressive anaphylaxis). 12/01/22  Yes [name], MD  ethyl chloride spray SPRAY A SMALL AMOUNT THREE TIMES A WEEK JUST PRIOR TO NEEDLE INSERTION 06/27/23  Yes [name], MD  fluticasone  (FLONASE ) 50 MCG/ACT nasal spray INSTILL 1 SPRAY IN EACH NOTRIL DAILY AS NEEDED 10/04/23  Yes [name], MD  hydrOXYzine  (ATARAX ) 25 MG tablet Take 3 tablets (75 mg total) by mouth at bedtime. 06/27/23  Yes [name], MD  isosorbide -hydrALAZINE  (BIDIL ) 20-37.5 MG tablet TAKE 1/2 TABLET BY MOUTH THREE TIMES DAILY ON MONDAY, WEDNESDAY, FRIDAY AND SUNDAY 12/08/23  Yes [place], [place],  FNP  lanthanum  (FOSRENOL ) 1000 MG chewable tablet Chew 2 tablets (2,000 mg total) by mouth 3 (three) times daily AND 1  tablet (1,000 mg total) 2 (two) times daily as needed with snacks 06/14/23  Yes Dennise Hoes, MD  linaclotide  (LINZESS ) 72 MCG capsule TAKE ONE CAPSULE BY MOUTH ON MONDAY, WEDNESDAY, FRIDAY AND SATURDAY 10/19/23  Yes [name], MD  mexiletine (MEXITIL ) 150 MG capsule Take 2 capsules (300 mg total) by mouth 2 (two) times daily. 10/03/23  Yes Rolan Ezra RAMAN, MD  multivitamin (RENA-VIT) TABS tablet Take 1 tablet by mouth daily. 08/23/23  Yes [provider]  ondansetron  (ZOFRAN ) 4 MG tablet Take 1 tablet (4 mg total) by mouth 3 (three) times daily before meals. 12/01/23  Yes Beather Delon Gibson, PA  pantoprazole  (PROTONIX ) 40 MG tablet Take 1 tablet (40 mg total) by mouth 2 (two) times daily before a meal. 12/01/23  Yes Lemmon, Delon Gibson, PA  Polyethylene Glycol 3350  (PEG 3350 ) 17 g PACK NEW PRESCRIPTION REQUEST: Polyethylene Glycol 3350 - 1 PACKET BY MOUTH DAILY AS NEEDED 12/01/22  Yes [name], MD  tadalafil  (CIALIS ) 10 MG tablet Take 1 tablet (10 mg total) by mouth daily as needed 30 min prior to sexual activity.  Do not take with Imdur  (isosorbide ) or nitroglycerin  02/22/23  Yes   amiodarone  (PACERONE ) 200 MG tablet TAKE ONE (1) TABLET BY MOUTH TWICE DAILY 12/19/23   McLean, Dalton S, MD  carvedilol  (COREG ) 6.25 MG tablet TAKE ONE TABLET BY MOUTH TWICE DAILY 12/19/23   Rolan Ezra RAMAN, MD  doxycycline  (VIBRA -TABS) 100 MG tablet Take 1 tablet (100 mg total) by mouth 2 (two) times daily as directed Patient not taking: Reported on 12/11/2023 11/15/23      Allergies  Allergen Reactions   Ace Inhibitors Swelling    Swelling of the tongue   Influenza Vaccines Hives and Swelling    SWELLING REACTION UNSPECIFIED    Ketorolac Swelling    SWELLING REACTION UNSPECIFIED    Lidocaine  Swelling    TONGUE SWELLS   Lisinopril Swelling    TONGUE SWELLING Pt reported problem with a BP med which sounded like  lisinopril  But as of 09/19/06,pt had tolerated altace without problem    Penicillins Swelling    TONGUE SWELLS Has patient had a PCN reaction causing immediate rash, facial/tongue/throat swelling, SOB or lightheadedness with hypotension: Yes Has patient had a PCN reaction causing severe rash involving mucus membranes or skin necrosis: No Has patient had a PCN reaction that required hospitalization No Has patient had a PCN reaction occurring within the last 10 years: Yes If all of the above answers are NO, then may proceed with Cephalosporin use.    Review of Systems  Unable to perform ROS: Mental status change    Physical Exam Cardiovascular:     Rate and Rhythm: Normal rate.  Pulmonary:     Effort: Pulmonary effort is normal.  Musculoskeletal:     Comments: Generalized weakness and muscle atrophy   Skin:    General: Skin is warm and dry.  Neurological:     Mental Status: He is alert.  Psychiatric:        Thought Content: Thought content is paranoid.        Cognition and Memory: Cognition is impaired.    Vital Signs: BP (!) 144/92 (BP Location: Left Arm)   Pulse 70   Temp 97.6 F (36.4 C) (Oral)   Resp 18   Ht 5' 7 (1.702 m)  Wt 96.5 kg   SpO2 94%   BMI 33.32 kg/m  Pain Scale: 0-10 POSS *See Group Information*: 1-Acceptable,Awake and alert Pain Score: 0-No pain   SpO2: SpO2: 94 % O2 Device:SpO2: 94 % O2 Flow Rate: .O2 Flow Rate (L/min): 1 L/min  IO: Intake/output summary: No intake or output data in the 24 hours ending 12/20/23 0921  LBM: Last BM Date : 12/17/22 Baseline Weight: Weight: 100.8 kg Most recent weight: Weight: 96.5 kg     Palliative Assessment/Data:     Time 90 minutes  Discussed with attending and bedside RN  Signed by: Ronal Plants, NP   Please contact Palliative Medicine Team phone at [phone] for questions and concerns.  For individual provider: See Tracey

## 2023-12-20 NOTE — Progress Notes (Signed)
 PHARMACY - ANTICOAGULATION CONSULT NOTE  Pharmacy Consult for Heparin  (Eliquis  on hold) Indication: atrial fibrillation  Allergies  Allergen Reactions   Ace Inhibitors Swelling    Swelling of the tongue   Influenza Vaccines Hives and Swelling    SWELLING REACTION UNSPECIFIED    Ketorolac Swelling    SWELLING REACTION UNSPECIFIED    Lidocaine  Swelling    TONGUE SWELLS   Lisinopril Swelling    TONGUE SWELLING Pt reported problem with a BP med which sounded like  lisinopril  But as of 09/19/06,pt had tolerated altace without problem   Penicillins Swelling    TONGUE SWELLS Has patient had a PCN reaction causing immediate rash, facial/tongue/throat swelling, SOB or lightheadedness with hypotension: Yes Has patient had a PCN reaction causing severe rash involving mucus membranes or skin necrosis: No Has patient had a PCN reaction that required hospitalization No Has patient had a PCN reaction occurring within the last 10 years: Yes If all of the above answers are NO, then may proceed with Cephalosporin use.     Patient Measurements: Height: 5' 7 (170.2 cm) Weight: 98.7 kg (217 lb 9.5 oz) IBW/kg (Calculated) : 66.1 Heparin  Dosing Weight: 89 kg  Vital Signs: Temp: 98.7 F (37.1 C) (01/14 1357) Temp Source: Oral (01/14 1357) BP: 113/77 (01/14 1430) Pulse Rate: 67 (01/14 1430)  Labs: Recent Labs    12/18/23 0653 12/19/23 0809 12/19/23 1805 12/20/23 1404  HGB 11.1* 11.5*  --  10.7*  HCT 32.2* 33.4*  --  30.2*  PLT 181 216  --  228  HEPARINUNFRC 0.30 0.29* 0.29* 0.18*  CREATININE 7.52* 8.98*  --   --     Estimated Creatinine Clearance: 9.8 mL/min (A) (by C-G formula based on SCr of 8.98 mg/dL (H)).  Assessment: 64 yr old male on Eliquis  5 mg BID prior to admission for hx atrial fibrillation.  Eliquis  now held for EUS with FN biopsy of pancreatic head mass.  Pharmacy consulted to restart IV heparin  bridge at midnight 1/9 post procedure.  Continuing on heparin  with  ongoing procedures.    Heparin  level 0.18 is subtherapeutic on 950 units/hr.   Lab draw delayed as patient refused all labs/medications this morning.  Hgb 10.7, pltc 228.  Removed PIV overnight but was able to switch heparin  to 2nd PIV with minimal interruption.    For EUS tomorrow @1430p .  Consult placed to hold 8h before procedure.  Goal of Therapy:  Heparin  level 0.3-0.7 Monitor platelets by anticoagulation protocol: Yes   Plan:  Increase heparin  to 1200 units/hr (on the conservative end with ongoing refusal of care) 8 hour anti-Xa level  Daily anti-Xa level level and CBC Stop heparin  @0630  (entered) Follow up plans to resume Eliquis  after procedures  Thank you for involving pharmacy in the patient's care.   Maurilio Fila, PharmD Clinical Pharmacist 12/20/2023  2:50 PM

## 2023-12-21 ENCOUNTER — Encounter (HOSPITAL_COMMUNITY): Payer: Self-pay | Admitting: Internal Medicine

## 2023-12-21 ENCOUNTER — Encounter (HOSPITAL_COMMUNITY): Admission: RE | Admit: 2023-12-21 | Payer: 59 | Source: Ambulatory Visit

## 2023-12-21 DIAGNOSIS — R627 Adult failure to thrive: Secondary | ICD-10-CM

## 2023-12-21 DIAGNOSIS — N186 End stage renal disease: Secondary | ICD-10-CM | POA: Diagnosis not present

## 2023-12-21 DIAGNOSIS — R109 Unspecified abdominal pain: Secondary | ICD-10-CM | POA: Diagnosis not present

## 2023-12-21 DIAGNOSIS — K8689 Other specified diseases of pancreas: Secondary | ICD-10-CM | POA: Diagnosis not present

## 2023-12-21 DIAGNOSIS — Z7189 Other specified counseling: Secondary | ICD-10-CM

## 2023-12-21 DIAGNOSIS — K8500 Idiopathic acute pancreatitis without necrosis or infection: Secondary | ICD-10-CM | POA: Diagnosis not present

## 2023-12-21 LAB — COMPREHENSIVE METABOLIC PANEL
ALT: 251 U/L — ABNORMAL HIGH (ref 0–44)
AST: 354 U/L — ABNORMAL HIGH (ref 15–41)
Albumin: 1.8 g/dL — ABNORMAL LOW (ref 3.5–5.0)
Alkaline Phosphatase: 191 U/L — ABNORMAL HIGH (ref 38–126)
Anion gap: 14 (ref 5–15)
BUN: 32 mg/dL — ABNORMAL HIGH (ref 6–20)
CO2: 25 mmol/L (ref 22–32)
Calcium: 8.6 mg/dL — ABNORMAL LOW (ref 8.9–10.3)
Chloride: 94 mmol/L — ABNORMAL LOW (ref 98–111)
Creatinine, Ser: 7.09 mg/dL — ABNORMAL HIGH (ref 0.61–1.24)
GFR, Estimated: 8 mL/min — ABNORMAL LOW (ref >60)
Glucose, Bld: 91 mg/dL (ref 70–99)
Potassium: 4.6 mmol/L (ref 3.5–5.1)
Sodium: 133 mmol/L — ABNORMAL LOW (ref 135–145)
Total Bilirubin: 2.5 mg/dL — ABNORMAL HIGH (ref 0.0–1.2)
Total Protein: 6.1 g/dL — ABNORMAL LOW (ref 6.5–8.1)

## 2023-12-21 MED ORDER — SODIUM CHLORIDE 0.9 % IV BOLUS
500.0000 mL | Freq: Once | INTRAVENOUS | Status: AC
  Administered 2023-12-21: 500 mL via INTRAVENOUS

## 2023-12-21 MED ORDER — SODIUM CHLORIDE 0.9 % IV BOLUS
250.0000 mL | Freq: Once | INTRAVENOUS | Status: AC
  Administered 2023-12-21: 250 mL via INTRAVENOUS

## 2023-12-21 MED ORDER — HEPARIN (PORCINE) 25000 UT/250ML-% IV SOLN
1400.0000 [IU]/h | INTRAVENOUS | Status: AC
  Administered 2023-12-21: 1350 [IU]/h via INTRAVENOUS
  Administered 2023-12-22: 1400 [IU]/h via INTRAVENOUS
  Filled 2023-12-21 (×2): qty 250

## 2023-12-21 NOTE — Progress Notes (Addendum)
Patient ID: Victor Thompson, male   DOB: December 05, 1961, 63 y.o.   MRN: [id number]    Progress Note from the Palliative Medicine Team at [facility]   [name]        Date: 12/21/2023 DOB: 1961/05/26  Age: 63 y.o. MRN#: [id number] Attending Physician: Willeen Niece, MD Primary Care Physician: [name], MD Admit Date: 12/10/2023   Reason for Consultation/Follow-up   Establishing Goals of Care   HPI/ Brief Hospital Review  63 y.o. male   admitted on 12/10/2023 with  medical history significant of ESRD on HD TTS schedule, ischemic cardiomyopathy, congestive heart reduced EF 25 to 30%, history of CAD, VT storm status post ICD currently on Eliquis and amiodarone, reactive airway disease and hyperlipidemia but emergency department complaining of epigastric pain, shortness of breath, cough and chest pain.     Further workup in the ED showed pancreatic mass concerning for malignancy.   Biopsy 12/14/2023 does not appear to be malignant, consistent with chronic pancreatitis   Hospitalization complicated by altered mental status   Patient and his family face treatment option decisions, advanced directive decisions and anticipatory care needs.   Subjective  Extensive chart review has been completed prior to meeting with patient/family  including labs, vital signs, imaging, progress/consult notes, orders, medications and available advance directive documents.    This NP assessed patient at the bedside as a follow up to  yesterday's GOCs meeting.  Patient is more alert and oriented, family at bedside.   Ongoing education regarding patient's multiple co-morbidities specific to ESRD/HD dependent, CHF, CAD, Immobility, severe protein calorie malnutrition  (albumin 1.8) immobility.  He continues to fail to thrive, he is high risk for further decompensation.  Patient and his family understand this.  Plan of care -Full code-reversed today from DNR/DNI at patient request  and family support -Continue all offered and available medical interventions to prolong life -SNF for rehab on discharge when medically stable -Recommend outpatient palliative services  Education offered today regarding  the importance of continued conversation with family and their  medical providers regarding overall plan of care and treatment options,  ensuring decisions are within the context of the patients values and GOCs.  Questions and concerns addressed   Discussed with primary team and nursing staff  Family encouraged to call PMT with  questions or concerns, contact info given.   Time: 50   minutes  Detailed review of medical records ( labs, imaging, vital signs), medically appropriate exam ( MS, skin, cardiac,  resp)   discussed with treatment team, counseling and education to patient, family, staff, documenting clinical information, medication management, coordination of care    Lorinda Creed NP  Palliative Medicine Team Team Phone # [phone] Pager [phone]

## 2023-12-21 NOTE — Progress Notes (Signed)
 [facility] Progress Note  Subjective: Seen in room, mother present HD yesterday 1.3L UF For EGD today More awake and cogent but still confused  Vitals:   12/21/23 0729 12/21/23 0743 12/21/23 1126 12/21/23 1128  BP:   (!) 79/45 (!) 89/48  Pulse:  69 66 67  Resp:  18    Temp:      TempSrc:      SpO2: 95%   94%  Weight:      Height:        Exam: NAD, confused on CPAP RRR no rub CTAB, coarse bs throughout +B/T of AVF S/nt/nd       Renal-related home meds: - eliquis  5 bid - coreg  6.25 bid, bidil  20-37.5 bid - fosrenol  2 gm ac tid - others: mexiletine 300 bid, PPI, symbicort , statin, amiodarone       OP HD: [facility] TTS   4h   450 /1.5  102kg  2/2 bath   AVF   Heparin  3000 - hect 9 micrograms three times per week - no esa   CXR 1/04 on admission - no acute disease   Assessment/ Plan: Pancreatic mass - w/ abd pain, CT +for mass a head of pancreas, also multiple local enlarged LN's. SP EUS on 1/08 w/ biopsy appearing not malignant and more c/w with chronic pancreatitis. Rpt EGD today given high concern for malignancy ESKD - on HD TTS. Next HD 1/16 UF 1L HTN - BP's soft. DC'd bidil  for now. On MTP and prn midodrine ; per cardiology 2/2 #8;  Volume - CXR negative. Under outpt EDW, adjust down at DC.  Gentle UF moving forward Anemia of eskd - Hb 11s, not on esa at OP unit. Follow.  Secondary hyperparathyroidism - CCa in range. Cont binders w/ meals.  H/o combined heart failure H/o VT storm/ ischemic CM/ PAF - sp ICD and takes mexilitene, BB and amiodarone . Cardiology is following. Goal for Cardiac PET 1/15  Given current cardiac, GI, and ongoing ESRD seems would be reasonable to involve palliative for GOC.  Maybe see what pancreatic process is. Poor prognosis.   Charletta Cons, MD  12/21/2023, 11:57 AM  Recent Labs  Lab 12/19/23 0809 12/20/23 1404 12/21/23 0640  HGB 11.5* 10.7*  --   ALBUMIN  1.9* 1.7* 1.8*  CALCIUM  9.2 8.8* 8.6*  CREATININE 8.98*  10.96* 7.09*  K 4.3 4.6 4.6   No results for input(s): "IRON", "TIBC", "FERRITIN" in the last 168 hours.  Inpatient medications:  amiodarone   200 mg Oral BID   Chlorhexidine  Gluconate Cloth  6 each Topical Q0600   Chlorhexidine  Gluconate Cloth  6 each Topical Q0600   Chlorhexidine  Gluconate Cloth  6 each Topical Q0600   docusate sodium   100 mg Oral BID   fluticasone  furoate-vilanterol  1 puff Inhalation Daily   hydrOXYzine   25 mg Oral TID   lanthanum   2,000 mg Oral TID WC   And   lanthanum   1,000 mg Oral BID BM   metoprolol  succinate  100 mg Oral Daily   mexiletine  300 mg Oral BID   pantoprazole   40 mg Oral BID   polyethylene glycol  17 g Oral BID   sodium chloride  flush  3 mL Intravenous Q12H     acetaminophen  **OR** acetaminophen , bisacodyl , calcium  carbonate, levalbuterol , midodrine , nitroGLYCERIN , ondansetron  **OR** ondansetron  (ZOFRAN ) IV, mouth rinse, senna, simethicone , sodium chloride  flush

## 2023-12-21 NOTE — Progress Notes (Signed)
 VAST consult received to obtain 2nd PIV access for heparin  drip as current IV access has fluid bolus infusing. Right arm is restricted due to AVF. Left arm assessed utilizing ultrasound. No appropriate vessels visualized for USGIV.  Educated Catering manager via Special educational needs teacher that while not ideal, heparin  drip can infuse with NS bolus. Further educated unit RN that if patient needs further access, physician will need to be contacted to insert a central line. Destiny, RN acknowledged information.

## 2023-12-21 NOTE — Progress Notes (Signed)
 Progress Note   LOS: 11 days   Chief Complaint: pancreatic head mass    Subjective   Unfortunately patient had low blood pressure today and thus his EUS had to be canceled.  Patient is feeling weak overall.  Still having some abdominal discomfort.  Patient is currently getting a bolus of normal saline.   Objective   Vital signs in last 24 hours: Temp:  [97.9 F (36.6 C)-98.3 F (36.8 C)] 98.2 F (36.8 C) (01/15 0556) Pulse Rate:  [64-73] 68 (01/15 1328) Resp:  [18-25] 18 (01/15 0743) BP: (77-103)/(45-69) 96/58 (01/15 1328) SpO2:  [93 %-100 %] 94 % (01/15 1128) FiO2 (%):  [21 %] 21 % (01/14 2240) Weight:  [97.4 kg-101.8 kg] 101.8 kg (01/15 0631) Last BM Date : 12/21/23 Last BM recorded by nurses in past 5 days Stool Type: Type 7 (Liquid consistency with no solid pieces) (12/21/2023 12:25 AM)  General:   male in no acute distress  Heart:  Regular rate and rhythm; no murmurs Pulm: Clear anteriorly; no wheezing Abdomen: soft, nondistended, normal bowel sounds in all quadrants.  Tender to palpation in the epigastric area.  No organomegaly appreciated. Extremities:  No edema Neurologic:  Alert and  oriented x4;  No focal deficits.  Psych:  Cooperative. Normal mood and affect.  Intake/Output from previous day: 01/14 0701 - 01/15 0700 In: -  Out: 1300  Intake/Output this shift: No intake/output data recorded.  Studies/Results: CT ABDOMEN PELVIS W CONTRAST Result Date: 12/19/2023 CLINICAL DATA:  pancreas lesion EXAM: CT ABDOMEN AND PELVIS WITH CONTRAST TECHNIQUE: Multidetector CT imaging of the abdomen and pelvis was performed using the standard protocol following bolus administration of intravenous contrast. RADIATION DOSE REDUCTION: This exam was performed according to the departmental dose-optimization program which includes automated exposure control, adjustment of the mA and/or kV according to patient size and/or use of iterative reconstruction technique. CONTRAST:  75mL  OMNIPAQUE  IOHEXOL  350 MG/ML SOLN COMPARISON:  CT abdomen pelvis 12/10/2023 FINDINGS: Lower chest: Interval development of bilateral lower lobe atelectasis. Interval development of left pleural effusion. Cardiomegaly. Hepatobiliary: No focal liver abnormality. Hyperdensity of the gallbladder lumen consistent with vicarious excretion of previously administered intravenous contrast. No gallstones, gallbladder wall thickening, or pericholecystic fluid. No biliary dilatation. Pancreas: Redemonstration of a large heterogeneous hypodense mass along the pancreatic head measuring up to at least 5 cm (2:35). Interval increase in size and conspicuity of a fluid dense lesion of the distal pancreatic body measuring up to 2.4 x 1.4 cm (from 1.6 x 1.2 cm). Interval development of a hazy contour of the pancreatic parenchyma with associated peripancreatic fat stranding and simple free fluid. No definite main pancreatic ductal dilatation. Spleen: Normal in size without focal abnormality. Adrenals/Urinary Tract: No adrenal nodule bilaterally. A trophic kidneys.  Bilateral kidneys enhance symmetrically. No hydronephrosis. No hydroureter. The urinary bladder is decompressed and grossly unremarkable. No excretion of intravenous contrast of either kidneys on delayed view. Stomach/Bowel: Inspissated PO contrast within the bowel. Stomach is within normal limits. No evidence of bowel wall thickening or dilatation. Appendix appears normal. Vascular/Lymphatic: No abdominal aorta or iliac aneurysm. Moderate atherosclerotic plaque of the aorta and its branches. Persistent upper abdominal lymphadenopathy with slightly less conspicuous appearance due to surrounding inflammatory changes. No pelvic or inguinal lymphadenopathy. Reproductive: Prostate is unremarkable. Other: Interval development of trace simple free fluid within the abdomen. No intraperitoneal free gas. No organized fluid collection. Musculoskeletal: No abdominal wall hernia or  abnormality. No suspicious lytic or blastic osseous lesions. No  acute displaced fracture. Multilevel degenerative changes of the spine. IMPRESSION: 1. Interval development of acute pancreatitis. 2. Redemonstration of a 5 cm pancreatic head mass with persistent upper abdominal lymphadenopathy concerning for metastatic malignancy. Recommend further evaluation with MRI abdomen with and without contrast. 3. Interval increase in size and conspicuity of a fluid dense lesion of the distal pancreatic body measuring up to 2.4 x 1.4 cm. Finding could represent pseudocyst versus focal pancreatic duct dilatation. 4. Interval development of left pleural effusion. 5. Atrophic kidneys with no excretion of intravenous contrast on delayed view from either kidneys. Vicarious excretion of previously administered intravenous contrast from the gallbladder. Correlate with renal function tests. Electronically Signed   By: Morgane  Naveau M.D.   On: 12/19/2023 21:25    Lab Results: Recent Labs    12/19/23 0809 12/20/23 1404  WBC 9.1 9.5  HGB 11.5* 10.7*  HCT 33.4* 30.2*  PLT 216 228   BMET Recent Labs    12/19/23 0809 12/20/23 1404 12/21/23 0640  NA 132* 132* 133*  K 4.3 4.6 4.6  CL 92* 95* 94*  CO2 26 23 25   GLUCOSE 95 80 91  BUN 36* 50* 32*  CREATININE 8.98* 10.96* 7.09*  CALCIUM  9.2 8.8* 8.6*   LFT Recent Labs    12/21/23 0640  PROT 6.1*  ALBUMIN  1.8*  AST 354*  ALT 251*  ALKPHOS 191*  BILITOT 2.5*   PT/INR No results for input(s): "LABPROT", "INR" in the last 72 hours.   Scheduled Meds:  amiodarone   200 mg Oral BID   Chlorhexidine  Gluconate Cloth  6 each Topical Q0600   docusate sodium   100 mg Oral BID   fluticasone  furoate-vilanterol  1 puff Inhalation Daily   hydrOXYzine   25 mg Oral TID   lanthanum   2,000 mg Oral TID WC   And   lanthanum   1,000 mg Oral BID BM   metoprolol  succinate  100 mg Oral Daily   mexiletine  300 mg Oral BID   pantoprazole   40 mg Oral BID   polyethylene  glycol  17 g Oral BID   sodium chloride  flush  3 mL Intravenous Q12H   Continuous Infusions:  heparin         Patient profile:   63 year old male with history of CHF status post AICD, CAD, end-stage renal disease on dialysis, sleep apnea presents with epigastric discomfort shortness of breath and chest pain, found to have a CT scan with a pancreas head mass with surrounding LAN and possible hypodense hepatic lesion.    Impression:   Pancreatic head mass with LAN and elevated LFTs concerning for primary malignancy  Pancreatitis EGD/EUS 1/8 showed white nummular lesions in esophageal mucosa, gastritis, prominent major and minor papilla, single duodenal polyp.  Masslike region in the superior pancreatic head that was biopsied. Pathology from EUS showed changes compatible with chronic pancreatitis and was negative for malignancy. Gastric biopsies showed gastropathy that was negative for H pylori. 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 Duodenal polyp was benign.  Patient refused to go down for MRI so we opted for CT pancreas protocol instead.  CT pancreas protocol again showed a 5 cm pancreatic head mass with persistent upper abdominal lymphadenopathy concerning for metastatic malignancy.  Patient also has signs of pancreatitis and increased interval size in size of a fluid density lesion in the distal pancreatic body that could represent a pseudocyst or focal pancreatic duct dilation.  Patient does appear to have some tenderness to palpation today.  His blood pressures were low  today, which could potentially be due to poor p.o. intake.  Patient is getting IV fluids today.  Hold parameters were added to his metoprolol .   Post EUS pancreatitis with possible pancreatic pseudocyst EUS 1/8 and had pain afterwards.   Patient's lipase was elevated a few days ago and subsequently down trended.     Normocytic anemia Hgb stable No overt GI bleeding Potentially secondary to  CKD/anemia of chronic disease -Continue to trend hemoglobin   CHF EF 30%, history of AICD Echo with EF 30 to 35% grade 1 diastolic mild MR no aortic stenosis   Nonobstructive CAD with elevated troponins .No chest pain currently, most recent troponin 47 and stable   End-stage renal disease  on dialysis 3 times weekly   Plan:   -Patient is getting IV fluids today to help with low blood pressures -Restarted diet today -Plan for repeat EUS this Friday 1/17.  Patient will need his IV heparin  drip held before his procedure.  Daina Drum MD 12/21/2023, 4:30 PM

## 2023-12-21 NOTE — Progress Notes (Signed)
 Occupational Therapy Treatment Patient Details Name: FINCH PFUHL MRN: [id number] DOB: July 17, 1961 Today's Date: 12/21/2023   History of present illness Pt is a 63 y/o M admitted on 12/10/23 after presenting with c/o epigastric pain, SOB, cough & chest pain. Workup showed pancreatic mass concerning for malignancy. PMH: ESRD on HD TTS, ischemic cardiomyopathy, CHF with reduced EF, CAD, VT storm s/p ICD, reactive airway disease, HLD   OT comments  Pt seen with mom at bedside and more family members arriving near end of session. Less agitated and suspicious vs evaluation, but continues to demonstrate disorientation to date and situation. Requires increased time to follow commands. 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 Pt requiring +2 max assist for side to sit. Stood with +2 max assist and RW, but with increasing lightheadedness and need to return to sitting. BP 80/55. Pt returned to supine with CGA. RN notified of BP. Patient will benefit from continued inpatient follow up therapy, <3 hours/day.      If plan is discharge home, recommend the following:  Two people to help with walking and/or transfers;A lot of help with bathing/dressing/bathroom;Assistance with cooking/housework;Assistance with feeding;Direct supervision/assist for medications management;Direct supervision/assist for financial management;Assist for transportation;Help with stairs or ramp for entrance   Equipment Recommendations  Other (comment) (defer)    Recommendations for Other Services      Precautions / Restrictions Precautions Precautions: Fall Precaution Comments: watch BP Restrictions Weight Bearing Restrictions Per Provider Order: No       Mobility Bed Mobility Overal bed mobility: Needs Assistance Bed Mobility: Sidelying to Sit, Supine to Sit   Sidelying to sit: +2 for physical assistance, Max assist   Sit to supine: Contact guard assist   General bed mobility comments: assist for LEs  over EOB and to raise trunk, pt able to return to supine without physical assist    Transfers Overall transfer level: Needs assistance Equipment used: Rolling walker (2 wheels) Transfers: Sit to/from Stand Sit to Stand: +2 physical assistance, Max assist           General transfer comment: increased time, assist to rise and steady     Balance Overall balance assessment: Needs assistance   Sitting balance-Leahy Scale: Fair     Standing balance support: During functional activity, Bilateral upper extremity supported Standing balance-Leahy Scale: Poor Standing balance comment: reliant on RW and +2 min assist statically                           ADL either performed or assessed with clinical judgement   ADL                                              Extremity/Trunk Assessment              Vision       Perception     Praxis      Cognition Arousal: Alert Behavior During Therapy: Flat affect Overall Cognitive Status: Impaired/Different from baseline Area of Impairment: Attention, Memory, Following commands, Safety/judgement, Awareness, Problem solving, Orientation                 Orientation Level: Disoriented to, Situation, Time Current Attention Level: Sustained Memory: Decreased short-term memory Following Commands: Follows one step commands with increased time Safety/Judgement: Decreased awareness of safety, Decreased awareness  of deficits Awareness: Intellectual Problem Solving: Slow processing, Decreased initiation, Difficulty sequencing, Requires verbal cues General Comments: Pt with decreased self monitoring of hypotension symptoms.        Exercises      Shoulder Instructions       General Comments      Pertinent Vitals/ Pain       Pain Assessment Pain Assessment: Faces Faces Pain Scale: Hurts little more Pain Location: mid chest Pain Descriptors / Indicators: Discomfort, Grimacing Pain  Intervention(s): Monitored during session, Repositioned, Other (comment) (RN aware)  Home Living                                          Prior Functioning/Environment              Frequency  Min 1X/week        Progress Toward Goals  OT Goals(current goals can now be found in the care plan section)  Progress towards OT goals: Progressing toward goals  Acute Rehab OT Goals OT Goal Formulation: With patient Time For Goal Achievement: 01/02/24 Potential to Achieve Goals: Good  Plan      Co-evaluation    PT/OT/SLP Co-Evaluation/Treatment: Yes Reason for Co-Treatment: For patient/therapist safety          AM-PAC OT "6 Clicks" Daily Activity     Outcome Measure   Help from another person eating meals?: A Little Help from another person taking care of personal grooming?: A Lot Help from another person toileting, which includes using toliet, bedpan, or urinal?: Total Help from another person bathing (including washing, rinsing, drying)?: A Lot Help from another person to put on and taking off regular upper body clothing?: A Lot Help from another person to put on and taking off regular lower body clothing?: Total 6 Click Score: 11    End of Session Equipment Utilized During Treatment: Gait belt;Rolling walker (2 wheels)  OT Visit Diagnosis: Muscle weakness (generalized) (M62.81);Other symptoms and signs involving cognitive function;Unsteadiness on feet (R26.81)   Activity Tolerance Treatment limited secondary to medical complications (Comment) (symptomatic hypotension)   Patient Left in bed;with call bell/phone within reach;with bed alarm set;with family/visitor present   Nurse Communication Other (comment) (aware BP dropped in sitting/standing)        Time: 1010-1034 OT Time Calculation (min): 24 min  Charges: OT General Charges $OT Visit: 1 Visit OT Treatments $Therapeutic Activity: 8-22 mins  Avanell Leigh, OTR/L Acute Rehabilitation  Services Office: [phone]   Jonette Nestle 12/21/2023, 1:24 PM

## 2023-12-21 NOTE — Progress Notes (Addendum)
  [name] Date of Encounter: 12/21/2023  Primary Cardiologist: Peder Bourdon, MD Electrophysiologist: Manya Sells, MD  Interval Summary   No new complaints this am. Still with abdominal pain.   Vital Signs    Vitals:   12/21/23 0631 12/21/23 0728 12/21/23 0729 12/21/23 0743  BP:  103/62    Pulse:  69  69  Resp:    18  Temp:      TempSrc:      SpO2:  93% 95%   Weight: 101.8 kg     Height:        Intake/Output Summary (Last 24 hours) at 12/21/2023 0900 Last data filed at 12/20/2023 1715 Gross per 24 hour  Intake --  Output 1300 ml  Net -1300 ml   Filed Weights   12/20/23 1715 12/21/23 0500 12/21/23 0631  Weight: 97.4 kg 98 kg 101.8 kg    Physical Exam    GEN- The patient is chronically ill appearing.  Lungs- Clear to ausculation bilaterally, normal work of breathing Cardiac- Regular rate and rhythm, no murmurs, rubs or gallops GI- soft, NT, ND, + BS Extremities- no clubbing or cyanosis. No edema  Telemetry    NSR with occasional PVCs 60s mostly (personally reviewed)  Hospital Course    Victor Thompson is a 63 y.o. male with a past medical history notable for CAD (PCI 2007), chronic systolic heart failure secondary to ICM w/hx of VT arrest and ICD (at [facility] center in 2015), recurrent ventricular tachycardia, dismal atrial fibrillation, HTN, HLD, ESRD on HD who is currently admitted with pancreatitis.    ICD therapies are currently off  Assessment & Plan    VT ICM s/p ICD with therapies OFF NSTEMI Quiescent on monitoring.  Continue amiodarone  200 mg BID for now. Can decrease at follow up.  Continue mexitil  300 mg BID He is not stable for transfer for cardiac sarcoid PET, will   HFrEF Volume management per HD Appears stable to dry today.   Parox AFib Stable. On heparin  drip for possible procedures  Elevated LFTs ? Chronic pancreatitis Likely in setting of chronic pancreatitis Work up ongoing.   EP will follow at a  distance and see as needed while remains here.  Please call with concerns.   For questions or updates, please contact [facility] Please consult [email or web address] for contact info under Cardiology/STEMI.  Signed, Tylene Galla, PA-C  12/21/2023, 9:00 AM   EP Attending  Patient seen and examined. CV with a RRR and lungs are clear. He is well controlled maintaining NSR. Continue amiodarone . The patient's long term prognosis from his cardiac condition is poor which is why along with his insistence after receiving so many shocks to keep the tachy therapies turned off. His ICD is MRI compatible.   Pete Brand Ines Rebel,MD

## 2023-12-21 NOTE — Progress Notes (Signed)
 PROGRESS NOTE    Victor Thompson  ZOX:[id number] DOB: 01/11/1961 DOA: 12/10/2023 PCP: Lawrance Presume, MD   Brief Narrative:  This 63 years old African-American male, with past medical history significant for ESRD on HD TTS schedule, Ischemic cardiomyopathy, congestive heart failure with reduced EF (25 to 30%), history of CAD, VT storm status post ICD, currently on Eliquis  and amiodarone , reactive airway disease and hyperlipidemia presented with epigastric pain, shortness of breath, cough and chest pain.  Further workup in the ED showed pancreatic mass concerning for malignancy.  Patient was transferred to [facility] as patient will likely need endoscopic ultrasound guided biopsy of pancreatic mass.  GI is following him.  12/14/2023: He underwent EUS guided biopsy of the pancreatic mass .  On heparin  drip.  Nephrology on board for Hemodialysis needs.  Assessment & Plan:   Principal Problem:   Pancreatic mass Active Problems:   Combined congestive systolic and diastolic heart failure (HCC)   Hypotension   Hypokalemia   Elevated troponin   Chest pain   Reactive airway disease   ESRD (end stage renal disease) (HCC)   History of ventricular tachycardia s/p ICD   Abdominal pain   Gastritis and gastroduodenitis   Idiopathic acute pancreatitis without infection or necrosis  Pancreatic Mass: GI consulted. Patient now s/p EUS on 1/8 with biopsy consistent with chronic pancreatitis, does not appear malignant. LFT's had been trending up post-procedure Follow up MRCP was recommended, however patient refused.  Now plan for EUS with FNA on 1/15 as per GI.   Essential Hypertension: Patient is now hypotensive requiring midodrine  as needed, Cardiology following , Continue metoprolol  with hold parameters.  Combined systolic and diastolic CHF: Continued metoprolol  with hold parameters, bidil  was discontinued. No evidence of acute CHF exacerbation.   H/o ventricular tachycardia s/p  ICD: Continue Amiodarone , metoprolol , mexiletine. Earlier patient continued to have runs of VT here, asymptomatic Per records, patient had elected to disable ICD in the past, does not want it on again. EP and Cardiology following.   Elevated troponin: Suspect secondary to demand from recurrent VT Continue IV heparin .  Reactive airway disease: COVID, flu and RSV negative.   Chest x-ray unremarkable.  CTA chest unremarkable. Continue with Breo Ellipta  twice daily and Xopenex  every 6 hour as needed for any wheezing,  shortness of breath   ESRD on dialysis TTS schedule: Last dialysis 12/12/22 with 3.5L removed. No sob. Nephrology on board for HD.   Obesity, Class II: BMI 36.64 Diet, exercise and weight reduction advised. Continue CPAP at night for OSA.   Constipation: Continue bowel regimen.   Post-ERCP pancreatitis: Lipase peaked to over 500's, later improved LFT's had trended up. Now showing downtrend Patient refused MRCP. Now plan for EUS with FNA on 1/15   End of Life: Palliative Care consulted, wishes confirmed to be DNR On-going goals of care.  Patient with poor prognosis   Toxic metabolic encephalopathy: Unclear etiology, but suspect may be related to hypotension recently Head CT reviewed, Negative for acute process Ordered CXR and blood cx to r/o infectious causes, however pt refused CXR and blood draws this AM Ammonia was normal  DVT prophylaxis: Heparin  Code Status: DNR Family Communication: Mother at bedside Disposition Plan:    Status is: Inpatient Remains inpatient appropriate because: Severity of illness   Consultants:  Gastroenterology  Procedures:  EUS with FNA Antimicrobials:  Anti-infectives (From admission, onward)    None      Subjective: Patient was seen and examined at bedside.  Overnight  events noted.   Patient was wearing BiPAP,  reports feeling same.   Patient is having endoscopic ultrasound with FNA today.  Objective: Vitals:    12/21/23 0729 12/21/23 0743 12/21/23 1126 12/21/23 1128  BP:   (!) 79/45 (!) 89/48  Pulse:  69 66 67  Resp:  18    Temp:      TempSrc:      SpO2: 95%   94%  Weight:      Height:        Intake/Output Summary (Last 24 hours) at 12/21/2023 1146 Last data filed at 12/20/2023 1715 Gross per 24 hour  Intake --  Output 1300 ml  Net -1300 ml   Filed Weights   12/20/23 1715 12/21/23 0500 12/21/23 0631  Weight: 97.4 kg 98 kg 101.8 kg    Examination:  General exam: Appears calm and comfortable, deconditioned, sick looking wearing BiPAP. Respiratory system: Clear to auscultation. Respiratory effort normal.  RR 15 Cardiovascular system: S1 & S2 heard, RRR. No JVD, murmurs, rubs, gallops or clicks.  Gastrointestinal system: Abdomen is non distended, soft and  mildly tender.  Normal bowel sounds heard. Central nervous system: Alert and oriented x 3. No focal neurological deficits. Extremities: No edema, no cyanosis, no clubbing Skin: No rashes, lesions or ulcers Psychiatry: Judgement and insight appear normal. Mood & affect appropriate.     Data Reviewed: I have personally reviewed following labs and imaging studies  CBC: Recent Labs  Lab 12/16/23 0759 12/17/23 0706 12/18/23 0653 12/19/23 0809 12/20/23 1404  WBC 12.9* 14.8* 11.3* 9.1 9.5  HGB 12.5* 11.9* 11.1* 11.5* 10.7*  HCT 37.0* 34.3* 32.2* 33.4* 30.2*  MCV 87.9 86.0 86.1 85.0 84.1  PLT 191 205 181 216 228   Basic Metabolic Panel: Recent Labs  Lab 12/16/23 1010 12/17/23 0706 12/18/23 0653 12/19/23 0809 12/20/23 1404 12/21/23 0640  NA  --  132* 134* 132* 132* 133*  K  --  4.3 4.1 4.3 4.6 4.6  CL  --  94* 95* 92* 95* 94*  CO2  --  24 27 26 23 25   GLUCOSE  --  85 86 95 80 91  BUN  --  32* 26* 36* 50* 32*  CREATININE  --  9.42* 7.52* 8.98* 10.96* 7.09*  CALCIUM   --  9.4 9.0 9.2 8.8* 8.6*  MG 1.9  --   --   --   --   --    GFR: Estimated Creatinine Clearance: 12.6 mL/min (A) (by C-G formula based on SCr of 7.09  mg/dL (H)). Liver Function Tests: Recent Labs  Lab 12/17/23 0706 12/18/23 0653 12/19/23 0809 12/20/23 1404 12/21/23 0640  AST 381* 595* 452* 305* 354*  ALT 201* 288* 295* 237* 251*  ALKPHOS 135* 159* 194* 189* 191*  BILITOT 1.1 1.7* 1.7* 1.8* 2.5*  PROT 6.1* 6.0* 6.4* 5.7* 6.1*  ALBUMIN  2.0* 1.8* 1.9* 1.7* 1.8*   Recent Labs  Lab 12/15/23 0656 12/16/23 0759 12/17/23 0929  LIPASE 501* 590* 116*   Recent Labs  Lab 12/19/23 1217  AMMONIA 30   Coagulation Profile: No results for input(s): "INR", "PROTIME" in the last 168 hours. Cardiac Enzymes: No results for input(s): "CKTOTAL", "CKMB", "CKMBINDEX", "TROPONINI" in the last 168 hours. BNP (last 3 results) No results for input(s): "PROBNP" in the last 8760 hours. HbA1C: No results for input(s): "HGBA1C" in the last 72 hours. CBG: No results for input(s): "GLUCAP" in the last 168 hours. Lipid Profile: No results for input(s): "CHOL", "HDL", "LDLCALC", "TRIG", "CHOLHDL", "  LDLDIRECT" in the last 72 hours. Thyroid  Function Tests: No results for input(s): "TSH", "T4TOTAL", "FREET4", "T3FREE", "THYROIDAB" in the last 72 hours. Anemia Panel: No results for input(s): "VITAMINB12", "FOLATE", "FERRITIN", "TIBC", "IRON", "RETICCTPCT" in the last 72 hours. Sepsis Labs: No results for input(s): "PROCALCITON", "LATICACIDVEN" in the last 168 hours.  Recent Results (from the past 240 hours)  Culture, blood (Routine X 2) w Reflex to ID Panel     Status: None (Preliminary result)   Collection Time: 12/20/23 10:38 PM   Specimen: BLOOD  Result Value Ref Range Status   Specimen Description BLOOD SITE NOT SPECIFIED  Final   Special Requests   Final    BOTTLES DRAWN AEROBIC AND ANAEROBIC Blood Culture results may not be optimal due to an inadequate volume of blood received in culture bottles   Culture   Final    NO GROWTH < 12 HOURS Performed at [facility], [address], [place], [place] [address]    Report Status PENDING   Incomplete  Culture, blood (Routine X 2) w Reflex to ID Panel     Status: None (Preliminary result)   Collection Time: 12/20/23 10:40 PM   Specimen: BLOOD  Result Value Ref Range Status   Specimen Description BLOOD SITE NOT SPECIFIED  Final   Special Requests   Final    BOTTLES DRAWN AEROBIC AND ANAEROBIC Blood Culture results may not be optimal due to an inadequate volume of blood received in culture bottles   Culture   Final    NO GROWTH < 12 HOURS Performed at [facility], [address] [place], [place] [address]    Report Status PENDING  Incomplete    Radiology Studies: CT ABDOMEN PELVIS W CONTRAST Result Date: 12/19/2023 CLINICAL DATA:  pancreas lesion EXAM: CT ABDOMEN AND PELVIS WITH CONTRAST TECHNIQUE: Multidetector CT imaging of the abdomen and pelvis was performed using the standard protocol following bolus administration of intravenous contrast. RADIATION DOSE REDUCTION: This exam was performed according to the departmental dose-optimization program which includes automated exposure control, adjustment of the mA and/or kV according to patient size and/or use of iterative reconstruction technique. CONTRAST:  75mL OMNIPAQUE  IOHEXOL  350 MG/ML SOLN COMPARISON:  CT abdomen pelvis 12/10/2023 FINDINGS: Lower chest: Interval development of bilateral lower lobe atelectasis. Interval development of left pleural effusion. Cardiomegaly. Hepatobiliary: No focal liver abnormality. Hyperdensity of the gallbladder lumen consistent with vicarious excretion of previously administered intravenous contrast. No gallstones, gallbladder wall thickening, or pericholecystic fluid. No biliary dilatation. Pancreas: Redemonstration of a large heterogeneous hypodense mass along the pancreatic head measuring up to at least 5 cm (2:35). Interval increase in size and conspicuity of a fluid dense lesion of the distal pancreatic body measuring up to 2.4 x 1.4 cm (from 1.6 x 1.2 cm). Interval development of a hazy  contour of the pancreatic parenchyma with associated peripancreatic fat stranding and simple free fluid. No definite main pancreatic ductal dilatation. Spleen: Normal in size without focal abnormality. Adrenals/Urinary Tract: No adrenal nodule bilaterally. A trophic kidneys.  Bilateral kidneys enhance symmetrically. No hydronephrosis. No hydroureter. The urinary bladder is decompressed and grossly unremarkable. No excretion of intravenous contrast of either kidneys on delayed view. Stomach/Bowel: Inspissated PO contrast within the bowel. Stomach is within normal limits. No evidence of bowel wall thickening or dilatation. Appendix appears normal. Vascular/Lymphatic: No abdominal aorta or iliac aneurysm. Moderate atherosclerotic plaque of the aorta and its branches. Persistent upper abdominal lymphadenopathy with slightly less conspicuous appearance due to surrounding  inflammatory changes. No pelvic or inguinal lymphadenopathy. Reproductive: Prostate is unremarkable. Other: Interval development of trace simple free fluid within the abdomen. No intraperitoneal free gas. No organized fluid collection. Musculoskeletal: No abdominal wall hernia or abnormality. No suspicious lytic or blastic osseous lesions. No acute displaced fracture. Multilevel degenerative changes of the spine. IMPRESSION: 1. Interval development of acute pancreatitis. 2. Redemonstration of a 5 cm pancreatic head mass with persistent upper abdominal lymphadenopathy concerning for metastatic malignancy. Recommend further evaluation with MRI abdomen with and without contrast. 3. Interval increase in size and conspicuity of a fluid dense lesion of the distal pancreatic body measuring up to 2.4 x 1.4 cm. Finding could represent pseudocyst versus focal pancreatic duct dilatation. 4. Interval development of left pleural effusion. 5. Atrophic kidneys with no excretion of intravenous contrast on delayed view from either kidneys. Vicarious excretion of  previously administered intravenous contrast from the gallbladder. Correlate with renal function tests. Electronically Signed   By: Morgane  Naveau M.D.   On: 12/19/2023 21:25   CT HEAD WO CONTRAST ( ) Result Date: 12/19/2023 CLINICAL DATA:  Head trauma.  Moderate-severe. EXAM: CT HEAD WITHOUT CONTRAST TECHNIQUE: Contiguous axial images were obtained from the base of the skull through the vertex without intravenous contrast. RADIATION DOSE REDUCTION: This exam was performed according to the departmental dose-optimization program which includes automated exposure control, adjustment of the mA and/or kV according to patient size and/or use of iterative reconstruction technique. COMPARISON:  12/31/2018 FINDINGS: Brain: The brain shows a normal appearance without evidence of malformation, atrophy, old or acute small or large vessel infarction, mass lesion, hemorrhage, hydrocephalus or extra-axial collection. Vascular: There is atherosclerotic calcification of the major vessels at the base of the brain. Skull: Normal.  No traumatic finding.  No focal bone lesion. Sinuses/Orbits: Sinuses are clear. Orbits appear normal. Mastoids are clear. Other: None significant IMPRESSION: No acute or traumatic finding. Normal appearance of the brain itself. Atherosclerotic calcification of the major vessels at the base of the brain. Electronically Signed   By: Bettylou Brunner M.D.   On: 12/19/2023 14:12   Scheduled Meds:  amiodarone   200 mg Oral BID   Chlorhexidine  Gluconate Cloth  6 each Topical Q0600   Chlorhexidine  Gluconate Cloth  6 each Topical Q0600   Chlorhexidine  Gluconate Cloth  6 each Topical Q0600   docusate sodium   100 mg Oral BID   fluticasone  furoate-vilanterol  1 puff Inhalation Daily   hydrOXYzine   25 mg Oral TID   lanthanum   2,000 mg Oral TID WC   And   lanthanum   1,000 mg Oral BID BM   metoprolol  succinate  100 mg Oral Daily   mexiletine  300 mg Oral BID   pantoprazole   40 mg Oral BID   polyethylene  glycol  17 g Oral BID   sodium chloride  flush  3 mL Intravenous Q12H   Continuous Infusions:   LOS: 11 days    Time spent: 50 mins    Magdalene School, MD [facility]   If 7PM-7AM, please contact night-coverage

## 2023-12-21 NOTE — Progress Notes (Signed)
 Pt unable to go down for procedure due to low BP. BP 77/55, MAP 64. No symptoms. MD made aware.

## 2023-12-21 NOTE — Progress Notes (Signed)
 Physical Therapy Treatment Patient Details Name: Victor Thompson MRN: [id number] DOB: 1960/12/13 Today's Date: 12/21/2023   History of Present Illness Pt is a 63 y/o M admitted on 12/10/23 after presenting with c/o epigastric pain, SOB, cough & chest pain. Workup showed pancreatic mass concerning for malignancy. PMH: ESRD on HD TTS, ischemic cardiomyopathy, CHF with reduced EF, CAD, VT storm s/p ICD, reactive airway disease, HLD    PT Comments  Pt resting in bed sleeping with BiPAP in place (wears when sleeping). Pt listening and motioning with "thumbs up" in agreement to mobilize with therapy. Pt requires mod-max assist to raise trunk upright and stabilize balance at EOB. Once steady and eyes open pt able to maintain seated balance with CGA. Sit<>stand completed 1x with RW and Max+2 for rise and safety. Pt c/o dizziness and BP noted to be 80/55 on return to sitting. Cued to return to supine and pt able to bring bil LE's onto bed with extra time and CGA. Min assist ultimately to reposition/center in bed at EOS. Pt remained confused today and limited by BP but motivated to progress. Will continue to progress as able. Patient will benefit from continued inpatient follow up therapy, <3 hours/day.    If plan is discharge home, recommend the following: A lot of help with walking and/or transfers;A lot of help with bathing/dressing/bathroom;Assistance with cooking/housework;Direct supervision/assist for medications management;Assist for transportation;Help with stairs or ramp for entrance;Supervision due to cognitive status   Can travel by private vehicle     No  Equipment Recommendations  Rolling walker (2 wheels)    Recommendations for Other Services       Precautions / Restrictions Precautions Precautions: Fall Precaution Comments: watch BP Restrictions Weight Bearing Restrictions Per Provider Order: No     Mobility  Bed Mobility Overal bed mobility: Needs Assistance Bed Mobility:  Sidelying to Sit, Supine to Sit   Sidelying to sit: +2 for physical assistance, Max assist, Mod assist   Sit to supine: Contact guard assist   General bed mobility comments: assist for LEs over EOB and to raise trunk, pt able to return to supine with CGA, min assist required to reposition/center in bed.    Transfers Overall transfer level: Needs assistance Equipment used: Rolling walker (2 wheels) Transfers: Sit to/from Stand Sit to Stand: +2 physical assistance, Max assist, Mod assist           General transfer comment: cues to initiate and assist to power up and stabilize standing balance.    Ambulation/Gait                   Stairs             Wheelchair Mobility     Tilt Bed    Modified Rankin (Stroke Patients Only)       Balance Overall balance assessment: Needs assistance Sitting-balance support: Feet supported, No upper extremity supported Sitting balance-Leahy Scale: Fair     Standing balance support: During functional activity, Bilateral upper extremity supported Standing balance-Leahy Scale: Poor Standing balance comment: reliant on RW and +2 min assist statically                            Cognition Arousal: Alert Behavior During Therapy: Flat affect Overall Cognitive Status: Impaired/Different from baseline Area of Impairment: Attention, Memory, Following commands, Safety/judgement, Awareness, Problem solving, Orientation  Orientation Level: Disoriented to, Situation, Time Current Attention Level: Sustained Memory: Decreased short-term memory Following Commands: Follows one step commands with increased time Safety/Judgement: Decreased awareness of safety, Decreased awareness of deficits Awareness: Intellectual Problem Solving: Slow processing, Decreased initiation, Difficulty sequencing, Requires verbal cues General Comments: Pt with decreased self monitoring of hypotension symptoms.         Exercises      General Comments        Pertinent Vitals/Pain Pain Assessment Pain Assessment: Faces Faces Pain Scale: Hurts little more Pain Location: mid chest Pain Descriptors / Indicators: Discomfort, Grimacing Pain Intervention(s): Limited activity within patient's tolerance, Monitored during session, Repositioned (RN aware of chest discomfort)    Home Living                          Prior Function            PT Goals (current goals can now be found in the care plan section) Acute Rehab PT Goals Patient Stated Goal: not stated PT Goal Formulation: With patient/family Time For Goal Achievement: 12/30/23 Potential to Achieve Goals: Good Progress towards PT goals: Progressing toward goals (limited by low BP and confusion)    Frequency    Min 1X/week      PT Plan      Co-evaluation PT/OT/SLP Co-Evaluation/Treatment: Yes Reason for Co-Treatment: For patient/therapist safety PT goals addressed during session: Mobility/safety with mobility;Balance OT goals addressed during session: ADL's and self-care      AM-PAC PT "6 Clicks" Mobility   Outcome Measure  Help needed turning from your back to your side while in a flat bed without using bedrails?: A Little Help needed moving from lying on your back to sitting on the side of a flat bed without using bedrails?: A Lot Help needed moving to and from a bed to a chair (including a wheelchair)?: Total Help needed standing up from a chair using your arms (e.g., wheelchair or bedside chair)?: Total Help needed to walk in hospital room?: Total Help needed climbing 3-5 steps with a railing? : Total 6 Click Score: 9    End of Session Equipment Utilized During Treatment: Gait belt Activity Tolerance: Patient tolerated treatment well;Other (comment);Patient limited by lethargy (hypotension) Patient left: in bed;with call bell/phone within reach;with bed alarm set;with family/visitor present;with nursing/sitter in  room Nurse Communication: Mobility status PT Visit Diagnosis: Muscle weakness (generalized) (M62.81);Other abnormalities of gait and mobility (R26.89)     Time: 1610-9604 PT Time Calculation (min) (ACUTE ONLY): 23 min  Charges:    $Therapeutic Activity: 8-22 mins PT General Charges $$ ACUTE PT VISIT: 1 Visit                     Tish Forge, DPT Acute Rehabilitation Services Office [phone]  12/21/23 2:20 PM

## 2023-12-21 NOTE — Progress Notes (Signed)
 PHARMACY - ANTICOAGULATION CONSULT NOTE  Pharmacy Consult for Heparin  (Eliquis  on hold) Indication: atrial fibrillation  Allergies  Allergen Reactions   Ace Inhibitors Swelling    Swelling of the tongue   Influenza Vaccines Hives and Swelling    SWELLING REACTION UNSPECIFIED    Ketorolac Swelling    SWELLING REACTION UNSPECIFIED    Lidocaine  Swelling    TONGUE SWELLS   Lisinopril Swelling    TONGUE SWELLING Pt reported problem with a BP med which sounded like  lisinopril  But as of 09/19/06,pt had tolerated altace without problem   Penicillins Swelling    TONGUE SWELLS Has patient had a PCN reaction causing immediate rash, facial/tongue/throat swelling, SOB or lightheadedness with hypotension: Yes Has patient had a PCN reaction causing severe rash involving mucus membranes or skin necrosis: No Has patient had a PCN reaction that required hospitalization No Has patient had a PCN reaction occurring within the last 10 years: Yes If all of the above answers are "NO", then may proceed with Cephalosporin use.     Patient Measurements: Height: 5\' 7"  (170.2 cm) Weight: 101.8 kg (224 lb 6.9 oz) IBW/kg (Calculated) : 66.1 Heparin  Dosing Weight: 89 kg  Vital Signs: Temp: 98.2 F (36.8 C) (01/15 0556) BP: 96/58 (01/15 1328) Pulse Rate: 68 (01/15 1328)  Labs: Recent Labs    12/19/23 0809 12/19/23 1805 12/20/23 1404 12/20/23 2236 12/21/23 0640  HGB 11.5*  --  10.7*  --   --   HCT 33.4*  --  30.2*  --   --   PLT 216  --  228  --   --   HEPARINUNFRC 0.29* 0.29* 0.18* 0.28*  --   CREATININE 8.98*  --  10.96*  --  7.09*    Estimated Creatinine Clearance: 12.6 mL/min (A) (by C-G formula based on SCr of 7.09 mg/dL (H)).  Assessment: 63 yr old male on Eliquis  5 mg BID prior to admission for hx atrial fibrillation.  Eliquis  now held for EUS with FN biopsy of pancreatic head mass.  Pharmacy consulted to restart IV heparin  bridge at midnight 1/9 post procedure.  Continuing on  heparin  with ongoing procedures.    Plan for EUS 1/17. Ok to restart heparin  per GI. Last heparin  leve subtherapeutic 0.28 on 1200 units/hr.   Goal of Therapy:  Heparin  level 0.3-0.7 Monitor platelets by anticoagulation protocol: Yes   Plan:  Restart heparin  at 1350 units/hr   F/u 8hr heparin  level  Monitor daily heparin  level, CBC, signs/symptoms of bleeding  F/u EUS timing, hold heparin  8hrs before Follow up plans to resume Eliquis  after procedures  Thank you for involving pharmacy in the patient's care.   Dorene Gang, PharmD, BCPS, BCCP Clinical Pharmacist  Please check AMION for all [facility] phone numbers After 10:00 PM, call Main Pharmacy [phone]

## 2023-12-22 ENCOUNTER — Other Ambulatory Visit: Payer: Self-pay | Admitting: Internal Medicine

## 2023-12-22 DIAGNOSIS — R109 Unspecified abdominal pain: Secondary | ICD-10-CM | POA: Diagnosis not present

## 2023-12-22 DIAGNOSIS — K8500 Idiopathic acute pancreatitis without necrosis or infection: Secondary | ICD-10-CM | POA: Diagnosis not present

## 2023-12-22 DIAGNOSIS — K8689 Other specified diseases of pancreas: Secondary | ICD-10-CM | POA: Diagnosis not present

## 2023-12-22 DIAGNOSIS — N186 End stage renal disease: Secondary | ICD-10-CM | POA: Diagnosis not present

## 2023-12-22 LAB — CBC
HCT: 30.6 % — ABNORMAL LOW (ref 39.0–52.0)
Hemoglobin: 10.7 g/dL — ABNORMAL LOW (ref 13.0–17.0)
MCH: 29.5 pg (ref 26.0–34.0)
MCHC: 35 g/dL (ref 30.0–36.0)
MCV: 84.3 fL (ref 80.0–100.0)
Platelets: 232 10*3/uL (ref 150–400)
RBC: 3.63 MIL/uL — ABNORMAL LOW (ref 4.22–5.81)
RDW: 17.4 % — ABNORMAL HIGH (ref 11.5–15.5)
WBC: 7.3 10*3/uL (ref 4.0–10.5)
nRBC: 0 % (ref 0.0–0.2)

## 2023-12-22 LAB — HEPARIN LEVEL (UNFRACTIONATED)
Heparin Unfractionated: 0.22 [IU]/mL — ABNORMAL LOW (ref 0.30–0.70)
Heparin Unfractionated: 0.32 [IU]/mL (ref 0.30–0.70)

## 2023-12-22 LAB — RENAL FUNCTION PANEL
Albumin: 1.6 g/dL — ABNORMAL LOW (ref 3.5–5.0)
Anion gap: 15 (ref 5–15)
BUN: 49 mg/dL — ABNORMAL HIGH (ref 6–20)
CO2: 23 mmol/L (ref 22–32)
Calcium: 8.9 mg/dL (ref 8.9–10.3)
Chloride: 94 mmol/L — ABNORMAL LOW (ref 98–111)
Creatinine, Ser: 9.04 mg/dL — ABNORMAL HIGH (ref 0.61–1.24)
GFR, Estimated: 6 mL/min — ABNORMAL LOW (ref >60)
Glucose, Bld: 79 mg/dL (ref 70–99)
Phosphorus: 4.5 mg/dL (ref 2.5–4.6)
Potassium: 4.5 mmol/L (ref 3.5–5.1)
Sodium: 132 mmol/L — ABNORMAL LOW (ref 135–145)

## 2023-12-22 LAB — BASIC METABOLIC PANEL
Anion gap: 12 (ref 5–15)
BUN: 41 mg/dL — ABNORMAL HIGH (ref 6–20)
CO2: 24 mmol/L (ref 22–32)
Calcium: 9 mg/dL (ref 8.9–10.3)
Chloride: 95 mmol/L — ABNORMAL LOW (ref 98–111)
Creatinine, Ser: 7.78 mg/dL — ABNORMAL HIGH (ref 0.61–1.24)
GFR, Estimated: 7 mL/min — ABNORMAL LOW (ref >60)
Glucose, Bld: 86 mg/dL (ref 70–99)
Potassium: 4.4 mmol/L (ref 3.5–5.1)
Sodium: 131 mmol/L — ABNORMAL LOW (ref 135–145)

## 2023-12-22 LAB — MAGNESIUM: Magnesium: 2.1 mg/dL (ref 1.7–2.4)

## 2023-12-22 LAB — PHOSPHORUS: Phosphorus: 4.4 mg/dL (ref 2.5–4.6)

## 2023-12-22 MED ORDER — HEPARIN SODIUM (PORCINE) 1000 UNIT/ML DIALYSIS: 2000.0000 [IU] | Freq: Once | INTRAMUSCULAR | Status: DC

## 2023-12-22 MED ORDER — HEPARIN SODIUM (PORCINE) 1000 UNIT/ML DIALYSIS: 3000.0000 [IU] | INTRAMUSCULAR | Status: DC | PRN

## 2023-12-22 MED ORDER — HEPARIN SODIUM (PORCINE) 1000 UNIT/ML DIALYSIS: 1000.0000 [IU] | INTRAMUSCULAR | Status: DC | PRN

## 2023-12-22 NOTE — Progress Notes (Addendum)
Progress Note   LOS: 12 days   Chief Complaint: pancreatic head mass    Subjective   Patient went for hemodialysis today.  His abdominal pain has improved   Objective   Vital signs in last 24 hours: Temp:  [97.7 F (36.5 C)-98.5 F (36.9 C)] 97.7 F (36.5 C) (01/16 1724) Pulse Rate:  [68-75] 69 (01/16 1730) Resp:  [18-28] 24 (01/16 1730) BP: (84-115)/(29-69) 96/53 (01/16 1730) SpO2:  [92 %-99 %] 99 % (01/16 1730) Weight:  [99.5 kg-100 kg] 99.7 kg (01/16 1729) Last BM Date : 12/21/23 Last BM recorded by nurses in past 5 days Stool Type: Type 7 (Liquid consistency with no solid pieces) (12/21/2023 12:25 AM)  General:   male in no acute distress  Heart:  Regular rate and rhythm; no murmurs Pulm: Clear anteriorly; no wheezing Abdomen: soft, nondistended, normal bowel sounds in all quadrants.  Nontender Extremities:  No edema Neurologic:  Alert and  oriented x4;  No focal deficits.  Psych:  Cooperative. Normal mood and affect.  Intake/Output from previous day: 01/15 0701 - 01/16 0700 In: 73.5 [I.V.:73.5] Out: -  Intake/Output this shift: Total I/O In: -  Out: -300   Studies/Results: No results found.   Lab Results: Recent Labs    12/20/23 1404 12/21/23 2343  WBC 9.5 7.3  HGB 10.7* 10.7*  HCT 30.2* 30.6*  PLT 228 232   BMET Recent Labs    12/21/23 0640 12/21/23 2343 12/22/23 1221  NA 133* 131* 132*  K 4.6 4.4 4.5  CL 94* 95* 94*  CO2 25 24 23   GLUCOSE 91 86 79  BUN 32* 41* 49*  CREATININE 7.09* 7.78* 9.04*  CALCIUM 8.6* 9.0 8.9   LFT Recent Labs    12/21/23 0640 12/22/23 1221  PROT 6.1*  --   ALBUMIN 1.8* 1.6*  AST 354*  --   ALT 251*  --   ALKPHOS 191*  --   BILITOT 2.5*  --    PT/INR No results for input(s): "LABPROT", "INR" in the last 72 hours.   Scheduled Meds:  amiodarone  200 mg Oral BID   Chlorhexidine Gluconate Cloth  6 each Topical Q0600   docusate sodium  100 mg Oral BID   fluticasone furoate-vilanterol  1 puff  Inhalation Daily   [START ON 12/23/2023] heparin  2,000 Units Dialysis Once in dialysis   hydrOXYzine  25 mg Oral TID   lanthanum  2,000 mg Oral TID WC   And   lanthanum  1,000 mg Oral BID BM   metoprolol succinate  100 mg Oral Daily   mexiletine  300 mg Oral BID   pantoprazole  40 mg Oral BID   polyethylene glycol  17 g Oral BID   sodium chloride flush  3 mL Intravenous Q12H   Continuous Infusions:  heparin 1,400 Units/hr (12/22/23 1419)      Patient profile:   63 year old male with history of CHF status post AICD, CAD, end-stage renal disease on dialysis, sleep apnea presents with epigastric discomfort shortness of breath and chest pain, found to have a CT scan with a pancreas head mass with surrounding LAN and possible hypodense hepatic lesion.    Impression:   Pancreatic head mass with LAN and elevated LFTs concerning for primary malignancy  Pancreatitis EGD/EUS 1/8 showed white nummular lesions in esophageal mucosa, gastritis, prominent major and minor papilla, single duodenal polyp.  Masslike region in the superior pancreatic head that was biopsied. Pathology from EUS showed changes compatible  with chronic pancreatitis and was negative for malignancy. Gastric biopsies showed gastropathy that was negative for H pylori. 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 Duodenal polyp was benign.  Patient refused to go down for MRI so we opted for CT pancreas protocol instead.  CT pancreas protocol again showed a 5 cm pancreatic head mass with persistent upper abdominal lymphadenopathy concerning for metastatic malignancy.  Patient also has signs of pancreatitis and increased interval size in size of a fluid density lesion in the distal pancreatic body that could represent a pseudocyst or focal pancreatic duct dilation. CA19-9 was normal. Patient's abdominal pain has improved.  An attempt at repeat EUS is planned for tomorrow.   Post EUS pancreatitis with possible  pancreatic pseudocyst EUS 1/8 and had pain afterwards.   Patient's lipase was elevated a few days ago and subsequently down trended.     Normocytic anemia Hgb stable No overt GI bleeding Potentially secondary to CKD/anemia of chronic disease -Continue to trend hemoglobin   CHF EF 30%, history of AICD Echo with EF 30 to 35% grade 1 diastolic mild MR no aortic stenosis   Nonobstructive CAD with elevated troponins .No chest pain currently, most recent troponin 47 and stable   End-stage renal disease  on dialysis 3 times weekly   Plan:   -Ordered chromogranin A and CEA -Plan for repeat EUS this Friday 1/17.  Placed pharmacy consult to hold heparin drip before EUS.  N.p.o. after midnight.  Imogene Burn MD 12/22/2023, 6:20 PM

## 2023-12-22 NOTE — TOC Progression Note (Signed)
Transition of Care [facility]) - Progression Note    Patient Details  Name: Victor Thompson MRN: [id number] Date of Birth: [birth year]  Transition of Care [facility]) CM/SW Contact  Gregorey Nabor A [place], [place] Phone Number: 12/22/2023, 5:06 PM  Clinical Narrative:      CSW contacted pt's mother, [name] to provide update on discharge plan for SNF. There was no answer. CSW unable to leave VM.  CSW to follow up again at another more opportune time.    TOC will continue to follow.       Expected Discharge Plan and Services                                               Social Determinants of Health (SDOH) Interventions SDOH Screenings   Food Insecurity: No Food Insecurity (12/11/2023)  Housing: Unknown (12/11/2023)  Transportation Needs: No Transportation Needs (12/11/2023)  Utilities: Not At Risk (12/11/2023)  Alcohol Screen: Low Risk  (08/20/2022)  Depression (PHQ2-9): Low Risk  (07/27/2023)  Financial Resource Strain: Low Risk  (12/09/2023)  Physical Activity: Inactive (02/17/2023)  Stress: No Stress Concern Present (02/17/2023)  Tobacco Use: Low Risk  (12/21/2023)    Readmission Risk Interventions    12/19/2023    4:18 PM  Readmission Risk Prevention Plan  Transportation Screening Complete  PCP or Specialist Appt within 3-5 Days Complete  HRI or Home Care Consult Complete  Social Work Consult for Recovery Care Planning/Counseling Complete  Palliative Care Screening Complete  Medication Review Oceanographer) Complete

## 2023-12-22 NOTE — Progress Notes (Signed)
PROGRESS NOTE    Victor Thompson  WUJ:[id number] DOB: [birth year] DOA: 12/10/2023 PCP: [name], MD   Brief Narrative:  This 63 years old African-American male, with past medical history significant for ESRD on HD TTS schedule, Ischemic cardiomyopathy, congestive heart failure with reduced EF (25 to 30%), history of CAD, VT storm status post ICD, currently on Eliquis and amiodarone, reactive airway disease and hyperlipidemia presented with epigastric pain, shortness of breath, cough and chest pain.  Further workup in the ED showed pancreatic mass concerning for malignancy.  Patient was transferred to [facility] as patient will likely need endoscopic ultrasound guided biopsy of pancreatic mass.  GI is following him.  12/14/2023: He underwent EUS guided biopsy of the pancreatic mass .  On heparin drip.  Nephrology on board for Hemodialysis needs.  Assessment & Plan:   Principal Problem:   Pancreatic mass Active Problems:   Combined congestive systolic and diastolic heart failure (HCC)   Hypotension   Hypokalemia   Elevated troponin   Chest pain   Reactive airway disease   ESRD (end stage renal disease) (HCC)   History of ventricular tachycardia s/p ICD   Abdominal pain   Gastritis and gastroduodenitis   Idiopathic acute pancreatitis without infection or necrosis  Pancreatic Mass: GI consulted. Patient now s/p EUS on 1/8 with biopsy consistent with chronic pancreatitis, does not appear malignant. LFT's had been trending up post-procedure Follow up MRCP was recommended, however patient refused.  Now plan for EUS with FNA on 1/17 as per GI.  EUS is limited by hypotension.   Essential Hypertension: Patient is now hypotensive requiring midodrine as needed, Cardiology following , Continue metoprolol with hold parameters.  Combined systolic and diastolic CHF: Continued metoprolol with hold parameters, bidil was discontinued. No evidence of acute CHF exacerbation.   H/o  ventricular tachycardia s/p ICD: Continue Amiodarone, metoprolol, mexiletine. Earlier patient continued to have runs of VT here, asymptomatic Per records, patient had elected to disable ICD in the past, does not want it on again. EP and Cardiology following.   Elevated troponin: Suspect secondary to demand from recurrent VT. Continue IV heparin.  Reactive airway disease: COVID, flu and RSV negative.   Chest x-ray unremarkable.  CTA chest unremarkable. Continue with Breo Ellipta twice daily and Xopenex every 6 hour as needed for any wheezing,  shortness of breath   ESRD on dialysis TTS schedule: Last dialysis 12/12/22 with 3.5L removed. No sob. Nephrology on board for HD.   Obesity, Class II: BMI 36.64 Diet, exercise and weight reduction advised. Continue CPAP at night for OSA.   Constipation: Continue bowel regimen.   Post-ERCP pancreatitis: Lipase peaked to over 500's, later improved. LFT's had trended up. Now showing downtrend Patient refused MRCP. Now plan for EUS with FNA on 1/17   End of Life: Palliative Care consulted, wishes confirmed to be DNR On-going goals of care.  Patient with poor prognosis. Patient and family transitioned code status to full code now.   Toxic metabolic encephalopathy: > Resolved. Unclear etiology, but suspect may be related to hypotension recently Head CT reviewed, Negative for acute process Ordered CXR and blood cx to r/o infectious causes, however pt refused CXR and blood draws this AM Ammonia was normal  DVT prophylaxis: Heparin Code Status: DNR Family Communication: Mother at bedside Disposition Plan:    Status is: Inpatient Remains inpatient appropriate because: Severity of illness   Consultants:  Gastroenterology  Procedures:  EUS with FNA Antimicrobials:  Anti-infectives (From admission, onward)  None      Subjective: Patient was seen and examined at bedside.  Overnight events noted.   Patient reports feeling  improved. He is frustrated with the rescheduling of his endoscopy. Patient is having endoscopic ultrasound with FNA on Friday.  Objective: Vitals:   12/22/23 0503 12/22/23 0509 12/22/23 0734 12/22/23 0736  BP: (!) 115/49  (!) 94/29 (!) 106/49  Pulse: 75  73 73  Resp: 18  20   Temp: 97.8 F (36.6 C)  97.9 F (36.6 C)   TempSrc: Oral     SpO2: 92%  94%   Weight:  100 kg    Height:        Intake/Output Summary (Last 24 hours) at 12/22/2023 1114 Last data filed at 12/22/2023 0117 Gross per 24 hour  Intake 73.53 ml  Output --  Net 73.53 ml   Filed Weights   12/21/23 0500 12/21/23 0631 12/22/23 0509  Weight: 98 kg 101.8 kg 100 kg    Examination:  General exam: Appears comfortable, deconditioned, sick looking, not in any distress. Respiratory system: CTA bilaterally. Respiratory effort normal.  RR 15 Cardiovascular system: S1 & S2 heard, RRR. No JVD, murmurs, rubs, gallops or clicks.  Gastrointestinal system: Abdomen is non distended, soft and  mildly tender.  Normal bowel sounds heard. Central nervous system: Alert and oriented x 3. No focal neurological deficits. Extremities: No edema, no cyanosis, no clubbing Skin: No rashes, lesions or ulcers Psychiatry: Judgement and insight appear normal. Mood & affect appropriate.     Data Reviewed: I have personally reviewed following labs and imaging studies  CBC: Recent Labs  Lab 12/17/23 0706 12/18/23 0653 12/19/23 0809 12/20/23 1404 12/21/23 2343  WBC 14.8* 11.3* 9.1 9.5 7.3  HGB 11.9* 11.1* 11.5* 10.7* 10.7*  HCT 34.3* 32.2* 33.4* 30.2* 30.6*  MCV 86.0 86.1 85.0 84.1 84.3  PLT 205 181 216 228 232   Basic Metabolic Panel: Recent Labs  Lab 12/16/23 1010 12/17/23 0706 12/18/23 0653 12/19/23 0809 12/20/23 1404 12/21/23 0640 12/21/23 2343  NA  --    < > 134* 132* 132* 133* 131*  K  --    < > 4.1 4.3 4.6 4.6 4.4  CL  --    < > 95* 92* 95* 94* 95*  CO2  --    < > 27 26 23 25 24   GLUCOSE  --    < > 86 95 80 91 86   BUN  --    < > 26* 36* 50* 32* 41*  CREATININE  --    < > 7.52* 8.98* 10.96* 7.09* 7.78*  CALCIUM  --    < > 9.0 9.2 8.8* 8.6* 9.0  MG 1.9  --   --   --   --   --  2.1  PHOS  --   --   --   --   --   --  4.4   < > = values in this interval not displayed.   GFR: Estimated Creatinine Clearance: 11.4 mL/min (A) (by C-G formula based on SCr of 7.78 mg/dL (H)). Liver Function Tests: Recent Labs  Lab 12/17/23 0706 12/18/23 0653 12/19/23 0809 12/20/23 1404 12/21/23 0640  AST 381* 595* 452* 305* 354*  ALT 201* 288* 295* 237* 251*  ALKPHOS 135* 159* 194* 189* 191*  BILITOT 1.1 1.7* 1.7* 1.8* 2.5*  PROT 6.1* 6.0* 6.4* 5.7* 6.1*  ALBUMIN 2.0* 1.8* 1.9* 1.7* 1.8*   Recent Labs  Lab 12/16/23 0759 12/17/23 1610  LIPASE 590* 116*   Recent Labs  Lab 12/19/23 1217  AMMONIA 30   Coagulation Profile: No results for input(s): "INR", "PROTIME" in the last 168 hours. Cardiac Enzymes: No results for input(s): "CKTOTAL", "CKMB", "CKMBINDEX", "TROPONINI" in the last 168 hours. BNP (last 3 results) No results for input(s): "PROBNP" in the last 8760 hours. HbA1C: No results for input(s): "HGBA1C" in the last 72 hours. CBG: No results for input(s): "GLUCAP" in the last 168 hours. Lipid Profile: No results for input(s): "CHOL", "HDL", "LDLCALC", "TRIG", "CHOLHDL", "LDLDIRECT" in the last 72 hours. Thyroid Function Tests: No results for input(s): "TSH", "T4TOTAL", "FREET4", "T3FREE", "THYROIDAB" in the last 72 hours. Anemia Panel: No results for input(s): "VITAMINB12", "FOLATE", "FERRITIN", "TIBC", "IRON", "RETICCTPCT" in the last 72 hours. Sepsis Labs: No results for input(s): "PROCALCITON", "LATICACIDVEN" in the last 168 hours.  Recent Results (from the past 240 hours)  Culture, blood (Routine X 2) w Reflex to ID Panel     Status: None (Preliminary result)   Collection Time: 12/20/23 10:38 PM   Specimen: BLOOD  Result Value Ref Range Status   Specimen Description BLOOD SITE NOT  SPECIFIED  Final   Special Requests   Final    BOTTLES DRAWN AEROBIC AND ANAEROBIC Blood Culture results may not be optimal due to an inadequate volume of blood received in culture bottles   Culture   Final    NO GROWTH 2 DAYS Performed at [facility], [address], [place], [place] [address]    Report Status PENDING  Incomplete  Culture, blood (Routine X 2) w Reflex to ID Panel     Status: None (Preliminary result)   Collection Time: 12/20/23 10:40 PM   Specimen: BLOOD  Result Value Ref Range Status   Specimen Description BLOOD SITE NOT SPECIFIED  Final   Special Requests   Final    BOTTLES DRAWN AEROBIC AND ANAEROBIC Blood Culture results may not be optimal due to an inadequate volume of blood received in culture bottles   Culture   Final    NO GROWTH 2 DAYS Performed at [facility], [address], [place], [place] [address]    Report Status PENDING  Incomplete    Radiology Studies: No results found.  Scheduled Meds:  amiodarone  200 mg Oral BID   Chlorhexidine Gluconate Cloth  6 each Topical Q0600   docusate sodium  100 mg Oral BID   fluticasone furoate-vilanterol  1 puff Inhalation Daily   hydrOXYzine  25 mg Oral TID   lanthanum  2,000 mg Oral TID WC   And   lanthanum  1,000 mg Oral BID BM   metoprolol succinate  100 mg Oral Daily   mexiletine  300 mg Oral BID   pantoprazole  40 mg Oral BID   polyethylene glycol  17 g Oral BID   sodium chloride flush  3 mL Intravenous Q12H   Continuous Infusions:  heparin 1,400 Units/hr (12/22/23 0117)     LOS: 12 days    Time spent: 35 mins    Willeen Niece, MD [facility]   If 7PM-7AM, please contact night-coverage

## 2023-12-22 NOTE — Progress Notes (Signed)
PHARMACY - ANTICOAGULATION CONSULT NOTE  Pharmacy Consult for Heparin (Eliquis on hold) Indication: atrial fibrillation  Patient Measurements: Height: 5\' 7"  (170.2 cm) Weight: 101.8 kg (224 lb 6.9 oz) IBW/kg (Calculated) : 66.1 Heparin Dosing Weight: 89 kg  Vital Signs: Temp: 97.7 F (36.5 C) (01/16 0010) Temp Source: Oral (01/16 0010) BP: 108/60 (01/16 0010) Pulse Rate: 68 (01/16 0010)  Labs: Recent Labs    12/19/23 0809 12/19/23 1805 12/20/23 1404 12/20/23 2236 12/21/23 0640 12/21/23 2343  HGB 11.5*  --  10.7*  --   --  10.7*  HCT 33.4*  --  30.2*  --   --  30.6*  PLT 216  --  228  --   --  232  HEPARINUNFRC 0.29*   < > 0.18* 0.28*  --  0.22*  CREATININE 8.98*  --  10.96*  --  7.09*  --    < > = values in this interval not displayed.    Estimated Creatinine Clearance: 12.6 mL/min (A) (by C-G formula based on SCr of 7.09 mg/dL (H)).  Assessment: 63 yr old male on Eliquis 5 mg BID prior to admission for hx atrial fibrillation.  Eliquis now held for EUS with FN biopsy of pancreatic head mass.  Pharmacy consulted to restart IV heparin bridge at midnight 1/9 post procedure.  Continuing on heparin with ongoing procedures.    1/16 AM: heparin level subtherapeutic on 1350 units/hr (drawn ~4h after start at 1930). Per RN, no issues with heparin running continuously or signs/symptoms of bleeding. Plan for EUS 1/17. CBC stable  Goal of Therapy:  Heparin level 0.3-0.7 Monitor platelets by anticoagulation protocol: Yes   Plan:  Increase heparin slightly to 1400 units/hr   F/u 8hr heparin level  Monitor daily heparin level, CBC, signs/symptoms of bleeding  F/u EUS timing, hold heparin 8hrs before Follow up plans to resume Eliquis after procedures  Thank you for involving pharmacy in the patient's care.   Arabella Merles, PharmD. Clinical Pharmacist 12/22/2023 1:03 AM

## 2023-12-22 NOTE — Progress Notes (Signed)
Victor Thompson Progress Note  Subjective: Seen in room, mother present Remains confused and paranoid For HD today No ERCP yesterday 2/2 low BPs   Vitals:   12/22/23 0503 12/22/23 0509 12/22/23 0734 12/22/23 0736  BP: (!) 115/49  (!) 94/29 (!) 106/49  Pulse: 75  73 73  Resp: 18  20   Temp: 97.8 F (36.6 C)  97.9 F (36.6 C)   TempSrc: Oral     SpO2: 92%  94%   Weight:  100 kg    Height:        Exam: NAD, confuseed RRR no rub CTAB, coarse bs throughout +B/T of AVF S/nt/nd No LEE       Renal-related home meds: - eliquis 5 bid - coreg 6.25 bid, bidil 20-37.5 bid - fosrenol 2 gm ac tid - others: mexiletine 300 bid, PPI, symbicort, statin, amiodarone      OP HD: [facility]   4h   450 /1.5  102kg  2/2 bath   AVF   Heparin 3000 - hect 9 micrograms three times per week - no esa   CXR 1/04 on admission - no acute disease   Assessment/ Plan: Pancreatic mass - w/ abd pain, CT +for mass a head of pancreas, also multiple local enlarged LN's. SP EUS on 1/08 w/ biopsy appearing not malignant and more c/w with chronic pancreatitis. Rpt EGD is the goal but limited by low BPs;  High concern for malignancy ESKD - on HD TTS. Next HD 1/16 UF 1L max HTN - BP's soft. DC'd bidil for now. On MTP and prn midodrine; per cardiology 2/2 #8;  Volume - CXR negative. Under outpt EDW, adjust down at DC.  Gentle UF moving forward Anemia of eskd - Hb 11s, not on esa at OP unit. Follow.  Secondary hyperparathyroidism - CCa in range. Cont binders w/ meals.  H/o combined heart failure H/o VT storm/ ischemic CM/ PAF - sp ICD and takes mexilitene, BB and amiodarone. Cardiology is following. Goal for Cardiac PET 1/15 Debility / Decline: palliative following, FC  Arita Miss, MD  12/22/2023, 9:55 AM  Recent Labs  Lab 12/20/23 1404 12/21/23 0640 12/21/23 2343  HGB 10.7*  --  10.7*  ALBUMIN 1.7* 1.8*  --   CALCIUM 8.8* 8.6* 9.0  PHOS  --   --  4.4  CREATININE 10.96* 7.09*  7.78*  K 4.6 4.6 4.4   No results for input(s): "IRON", "TIBC", "FERRITIN" in the last 168 hours.  Inpatient medications:  amiodarone  200 mg Oral BID   Chlorhexidine Gluconate Cloth  6 each Topical Q0600   docusate sodium  100 mg Oral BID   fluticasone furoate-vilanterol  1 puff Inhalation Daily   hydrOXYzine  25 mg Oral TID   lanthanum  2,000 mg Oral TID WC   And   lanthanum  1,000 mg Oral BID BM   metoprolol succinate  100 mg Oral Daily   mexiletine  300 mg Oral BID   pantoprazole  40 mg Oral BID   polyethylene glycol  17 g Oral BID   sodium chloride flush  3 mL Intravenous Q12H    heparin 1,400 Units/hr (12/22/23 0117)    acetaminophen **OR** acetaminophen, bisacodyl, calcium carbonate, levalbuterol, midodrine, nitroGLYCERIN, ondansetron **OR** ondansetron (ZOFRAN) IV, mouth rinse, senna, simethicone, sodium chloride flush

## 2023-12-22 NOTE — Progress Notes (Signed)
This chaplain responded to PMT [facility] consult for creating the Pt. Advance Directive. The consult requested HCPOA. The chaplain provided education on HCPOA and Living Will. An incomplete document was left at the bedside for review. The Pt. mother-[name] and NP-Susan are at the bedside.  The Pt. acknowledged his interest in completing an AD along with naming the Pt. Victor Thompson as healthcare agent.  During this visit [name] asked the majority of the questions while the Pt. interjected intermittently, if questions were asked of him. The chaplain provided education on the hospital's policy of not completing Durable POA with the Pt.  The chaplain will revisit the Pt. on Friday. Education was provided on how to contact the chaplain for F/U.  Chaplain Stephanie Acre [phone]

## 2023-12-22 NOTE — Progress Notes (Signed)
Received patient in bed to unit.  Alert and oriented.  Informed consent signed and in chart.   TX duration:3   Patient tolerated well.  Transported back to the room  Alert, without acute distress.  Hand-off given to patient's nurse.   Access used: right AVF Access issues: none  Total UF removed: -300 Medication(s) given: none    12/22/23 1724  Vitals  Temp 97.7 F (36.5 C)  Temp Source Oral  BP (!) 95/58  MAP (mmHg) 68  BP Location Left Arm  BP Method Automatic  Patient Position (if appropriate) Lying  Pulse Rate 69  Pulse Rate Source Monitor  ECG Heart Rate 69  Resp (!) 24  Oxygen Therapy  SpO2 98 %  O2 Device Nasal Cannula  O2 Flow Rate (L/min) 2 L/min  During Treatment Monitoring  Blood Flow Rate (mL/min) 299 mL/min  Arterial Pressure (mmHg) -81.41 mmHg  Venous Pressure (mmHg) 258.57 mmHg  TMP (mmHg) -5.05 mmHg  Ultrafiltration Rate (mL/min) 0 mL/min  Dialysate Flow Rate (mL/min) 300 ml/min  Duration of HD Treatment -hour(s) 2.96 hour(s)  Cumulative Fluid Removed (mL) per Treatment  -156.32  HD Safety Checks Performed Yes  Intra-Hemodialysis Comments Tx completed (pt didnt tolerate tx well. uf remaind off majority of tx due to low sbp not staying above 90 with uf on per order.)  Dialysis Fluid Bolus Normal Saline  Bolus Amount (mL) 400 mL    [facility] [facility]

## 2023-12-22 NOTE — Progress Notes (Signed)
PHARMACY - ANTICOAGULATION CONSULT NOTE  Pharmacy Consult for Heparin (Eliquis on hold) Indication: atrial fibrillation  Patient Measurements: Height: 5\' 7"  (170.2 cm) Weight: 100 kg (220 lb 7.4 oz) IBW/kg (Calculated) : 66.1 Heparin Dosing Weight: 89 kg  Vital Signs: Temp: 97.9 F (36.6 C) (01/16 0734) Temp Source: Oral (01/16 0503) BP: 106/49 (01/16 0736) Pulse Rate: 73 (01/16 0736)  Labs: Recent Labs    12/20/23 1404 12/20/23 2236 12/21/23 0640 12/21/23 2343 12/22/23 0922  HGB 10.7*  --   --  10.7*  --   HCT 30.2*  --   --  30.6*  --   PLT 228  --   --  232  --   HEPARINUNFRC 0.18* 0.28*  --  0.22* 0.32  CREATININE 10.96*  --  7.09* 7.78*  --    ESRD  Assessment: 63 yr old male on Eliquis 5 mg BID prior to admission for hx atrial fibrillation. Eliquis now held for EUS with FN biopsy of pancreatic head mass. Pharmacy consulted to restart IV heparin bridge at midnight 1/9 post procedure. Continuing on heparin with ongoing procedures. EUS planned for 1/17.  Heparin level is now low therapeutic (0.32) on 1400 units/hr. CBC stable. No bleeding reported.  Per GI today, heparin to be held 6 hours prior to procedure on 1/17 (rather than prior plan to hold for 8 hours). EUS is scheduled for 10am on 1/17.  Goal of Therapy:  Heparin level 0.3-0.7 units/ml Monitor platelets by anticoagulation protocol: Yes   Plan:  Continue heparin drip at 1400 units/hr. Heparin drip to be held at 4am on 1/17 for procedure at 10am.  RN aware and will pass on to night RN.  Cancelled heparin level for 1/17 with drip stopping at 4am. Will follow up post-procedure on 1/17 for anticoagulation plans. Eliquis on hold.  Dennie Fetters, RPh 12/22/2023,11:44 AM

## 2023-12-22 NOTE — Progress Notes (Signed)
Pt arrived to unit with empty heparin bag. Floor nurse notified for a new bag.

## 2023-12-22 NOTE — Plan of Care (Signed)

## 2023-12-22 NOTE — H&P (View-Only) (Signed)
 Progress Note   LOS: 12 days   Chief Complaint: pancreatic head mass    Subjective   Patient went for hemodialysis today.  His abdominal pain has improved   Objective   Vital signs in last 24 hours: Temp:  [97.7 F (36.5 C)-98.5 F (36.9 C)] 97.7 F (36.5 C) (01/16 1724) Pulse Rate:  [68-75] 69 (01/16 1730) Resp:  [18-28] 24 (01/16 1730) BP: (84-115)/(29-69) 96/53 (01/16 1730) SpO2:  [92 %-99 %] 99 % (01/16 1730) Weight:  [99.5 kg-100 kg] 99.7 kg (01/16 1729) Last BM Date : 12/21/23 Last BM recorded by nurses in past 5 days Stool Type: Type 7 (Liquid consistency with no solid pieces) (12/21/2023 12:25 AM)  General:   male in no acute distress  Heart:  Regular rate and rhythm; no murmurs Pulm: Clear anteriorly; no wheezing Abdomen: soft, nondistended, normal bowel sounds in all quadrants.  Nontender Extremities:  No edema Neurologic:  Alert and  oriented x4;  No focal deficits.  Psych:  Cooperative. Normal mood and affect.  Intake/Output from previous day: 01/15 0701 - 01/16 0700 In: 73.5 [I.V.:73.5] Out: -  Intake/Output this shift: Total I/O In: -  Out: -300   Studies/Results: No results found.   Lab Results: Recent Labs    12/20/23 1404 12/21/23 2343  WBC 9.5 7.3  HGB 10.7* 10.7*  HCT 30.2* 30.6*  PLT 228 232   BMET Recent Labs    12/21/23 0640 12/21/23 2343 12/22/23 1221  NA 133* 131* 132*  K 4.6 4.4 4.5  CL 94* 95* 94*  CO2 25 24 23   GLUCOSE 91 86 79  BUN 32* 41* 49*  CREATININE 7.09* 7.78* 9.04*  CALCIUM 8.6* 9.0 8.9   LFT Recent Labs    12/21/23 0640 12/22/23 1221  PROT 6.1*  --   ALBUMIN 1.8* 1.6*  AST 354*  --   ALT 251*  --   ALKPHOS 191*  --   BILITOT 2.5*  --    PT/INR No results for input(s): "LABPROT", "INR" in the last 72 hours.   Scheduled Meds:  amiodarone  200 mg Oral BID   Chlorhexidine Gluconate Cloth  6 each Topical Q0600   docusate sodium  100 mg Oral BID   fluticasone furoate-vilanterol  1 puff  Inhalation Daily   [START ON 12/23/2023] heparin  2,000 Units Dialysis Once in dialysis   hydrOXYzine  25 mg Oral TID   lanthanum  2,000 mg Oral TID WC   And   lanthanum  1,000 mg Oral BID BM   metoprolol succinate  100 mg Oral Daily   mexiletine  300 mg Oral BID   pantoprazole  40 mg Oral BID   polyethylene glycol  17 g Oral BID   sodium chloride flush  3 mL Intravenous Q12H   Continuous Infusions:  heparin 1,400 Units/hr (12/22/23 1419)      Patient profile:   63 year old male with history of CHF status post AICD, CAD, end-stage renal disease on dialysis, sleep apnea presents with epigastric discomfort shortness of breath and chest pain, found to have a CT scan with a pancreas head mass with surrounding LAN and possible hypodense hepatic lesion.    Impression:   Pancreatic head mass with LAN and elevated LFTs concerning for primary malignancy  Pancreatitis EGD/EUS 1/8 showed white nummular lesions in esophageal mucosa, gastritis, prominent major and minor papilla, single duodenal polyp.  Masslike region in the superior pancreatic head that was biopsied. Pathology from EUS showed changes compatible  with chronic pancreatitis and was negative for malignancy. Gastric biopsies showed gastropathy that was negative for H pylori. 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 Duodenal polyp was benign.  Patient refused to go down for MRI so we opted for CT pancreas protocol instead.  CT pancreas protocol again showed a 5 cm pancreatic head mass with persistent upper abdominal lymphadenopathy concerning for metastatic malignancy.  Patient also has signs of pancreatitis and increased interval size in size of a fluid density lesion in the distal pancreatic body that could represent a pseudocyst or focal pancreatic duct dilation. CA19-9 was normal. Patient's abdominal pain has improved.  An attempt at repeat EUS is planned for tomorrow.   Post EUS pancreatitis with possible  pancreatic pseudocyst EUS 1/8 and had pain afterwards.   Patient's lipase was elevated a few days ago and subsequently down trended.     Normocytic anemia Hgb stable No overt GI bleeding Potentially secondary to CKD/anemia of chronic disease -Continue to trend hemoglobin   CHF EF 30%, history of AICD Echo with EF 30 to 35% grade 1 diastolic mild MR no aortic stenosis   Nonobstructive CAD with elevated troponins .No chest pain currently, most recent troponin 47 and stable   End-stage renal disease  on dialysis 3 times weekly   Plan:   -Ordered chromogranin A and CEA -Plan for repeat EUS this Friday 1/17.  Placed pharmacy consult to hold heparin drip before EUS.  N.p.o. after midnight.  Imogene Burn MD 12/22/2023, 6:20 PM

## 2023-12-23 ENCOUNTER — Inpatient Hospital Stay (HOSPITAL_COMMUNITY): Payer: 59 | Admitting: Certified Registered"

## 2023-12-23 ENCOUNTER — Encounter (HOSPITAL_COMMUNITY): Admission: EM | Disposition: E | Payer: Self-pay | Source: Home / Self Care | Attending: Internal Medicine

## 2023-12-23 ENCOUNTER — Encounter (HOSPITAL_COMMUNITY): Payer: Self-pay | Admitting: Internal Medicine

## 2023-12-23 DIAGNOSIS — R933 Abnormal findings on diagnostic imaging of other parts of digestive tract: Secondary | ICD-10-CM | POA: Diagnosis not present

## 2023-12-23 DIAGNOSIS — K8689 Other specified diseases of pancreas: Secondary | ICD-10-CM | POA: Diagnosis not present

## 2023-12-23 DIAGNOSIS — K3189 Other diseases of stomach and duodenum: Secondary | ICD-10-CM | POA: Diagnosis not present

## 2023-12-23 DIAGNOSIS — K2970 Gastritis, unspecified, without bleeding: Secondary | ICD-10-CM

## 2023-12-23 DIAGNOSIS — Z8719 Personal history of other diseases of the digestive system: Secondary | ICD-10-CM

## 2023-12-23 DIAGNOSIS — K869 Disease of pancreas, unspecified: Secondary | ICD-10-CM

## 2023-12-23 DIAGNOSIS — I2510 Atherosclerotic heart disease of native coronary artery without angina pectoris: Secondary | ICD-10-CM

## 2023-12-23 DIAGNOSIS — R935 Abnormal findings on diagnostic imaging of other abdominal regions, including retroperitoneum: Secondary | ICD-10-CM

## 2023-12-23 DIAGNOSIS — J449 Chronic obstructive pulmonary disease, unspecified: Secondary | ICD-10-CM

## 2023-12-23 HISTORY — PX: UPPER ESOPHAGEAL ENDOSCOPIC ULTRASOUND (EUS): SHX6562

## 2023-12-23 HISTORY — PX: ESOPHAGOGASTRODUODENOSCOPY (EGD) WITH PROPOFOL: SHX5813

## 2023-12-23 HISTORY — PX: FINE NEEDLE ASPIRATION: SHX5430

## 2023-12-23 LAB — CBC
HCT: 29.9 % — ABNORMAL LOW (ref 39.0–52.0)
Hemoglobin: 10.4 g/dL — ABNORMAL LOW (ref 13.0–17.0)
MCH: 29.1 pg (ref 26.0–34.0)
MCHC: 34.8 g/dL (ref 30.0–36.0)
MCV: 83.8 fL (ref 80.0–100.0)
Platelets: 233 10*3/uL (ref 150–400)
RBC: 3.57 MIL/uL — ABNORMAL LOW (ref 4.22–5.81)
RDW: 17.4 % — ABNORMAL HIGH (ref 11.5–15.5)
WBC: 7.1 10*3/uL (ref 4.0–10.5)
nRBC: 0 % (ref 0.0–0.2)

## 2023-12-23 LAB — BASIC METABOLIC PANEL
Anion gap: 14 (ref 5–15)
BUN: 31 mg/dL — ABNORMAL HIGH (ref 6–20)
CO2: 24 mmol/L (ref 22–32)
Calcium: 8.5 mg/dL — ABNORMAL LOW (ref 8.9–10.3)
Chloride: 95 mmol/L — ABNORMAL LOW (ref 98–111)
Creatinine, Ser: 6.36 mg/dL — ABNORMAL HIGH (ref 0.61–1.24)
GFR, Estimated: 9 mL/min — ABNORMAL LOW (ref >60)
Glucose, Bld: 81 mg/dL (ref 70–99)
Potassium: 4.3 mmol/L (ref 3.5–5.1)
Sodium: 133 mmol/L — ABNORMAL LOW (ref 135–145)

## 2023-12-23 SURGERY — ESOPHAGOGASTRODUODENOSCOPY (EGD) WITH PROPOFOL
Anesthesia: Monitor Anesthesia Care

## 2023-12-23 MED ORDER — MIDODRINE HCL 5 MG PO TABS: 10.0000 mg | ORAL_TABLET | Freq: Three times a day (TID) | ORAL | Status: DC | PRN

## 2023-12-23 MED ORDER — EPHEDRINE SULFATE-NACL 50-0.9 MG/10ML-% IV SOSY
PREFILLED_SYRINGE | INTRAVENOUS | Status: DC | PRN
  Administered 2023-12-23: 25 mg via INTRAVENOUS

## 2023-12-23 MED ORDER — SODIUM CHLORIDE 0.9 % IV BOLUS
500.0000 mL | Freq: Once | INTRAVENOUS | Status: AC
  Administered 2023-12-23: 500 mL via INTRAVENOUS

## 2023-12-23 MED ORDER — VASOPRESSIN 20 UNIT/ML IV SOLN
INTRAVENOUS | Status: DC | PRN
  Administered 2023-12-23: 1 [IU] via INTRAVENOUS
  Administered 2023-12-23: 2 [IU] via INTRAVENOUS

## 2023-12-23 MED ORDER — MIDODRINE HCL 5 MG PO TABS
5.0000 mg | ORAL_TABLET | Freq: Once | ORAL | Status: AC
  Administered 2023-12-23: 5 mg via ORAL
  Filled 2023-12-23: qty 1

## 2023-12-23 MED ORDER — PHENYLEPHRINE 80 MCG/ML (10ML) SYRINGE FOR IV PUSH (FOR BLOOD PRESSURE SUPPORT)
PREFILLED_SYRINGE | INTRAVENOUS | Status: DC | PRN
  Administered 2023-12-23: 80 ug via INTRAVENOUS

## 2023-12-23 MED ORDER — HEPARIN (PORCINE) 25000 UT/250ML-% IV SOLN
1500.0000 [IU]/h | INTRAVENOUS | Status: DC
  Administered 2023-12-23 – 2023-12-24 (×2): 1400 [IU]/h via INTRAVENOUS
  Administered 2023-12-25: 1500 [IU]/h via INTRAVENOUS
  Filled 2023-12-23 (×2): qty 250

## 2023-12-23 MED ORDER — PROPOFOL 10 MG/ML IV BOLUS
INTRAVENOUS | Status: DC | PRN
  Administered 2023-12-23: 20 mg via INTRAVENOUS
  Administered 2023-12-23: 30 mg via INTRAVENOUS
  Administered 2023-12-23: 20 mg via INTRAVENOUS

## 2023-12-23 MED ORDER — MIDODRINE HCL 5 MG PO TABS
10.0000 mg | ORAL_TABLET | Freq: Three times a day (TID) | ORAL | Status: DC
  Administered 2023-12-23 – 2023-12-26 (×8): 10 mg via ORAL
  Filled 2023-12-23 (×8): qty 2

## 2023-12-23 MED ORDER — SODIUM CHLORIDE 0.9 % IV SOLN: INTRAVENOUS | Status: DC

## 2023-12-23 SURGICAL SUPPLY — 14 items

## 2023-12-23 NOTE — Progress Notes (Signed)
Pt off unit going to endo

## 2023-12-23 NOTE — Anesthesia Preprocedure Evaluation (Signed)
Anesthesia Evaluation  Patient identified by MRN, date of birth, ID band Patient awake    Reviewed: Allergy & Precautions, NPO status , Patient's Chart, lab work & pertinent test results, reviewed documented beta blocker date and time   Airway Mallampati: II  TM Distance: >3 FB Neck ROM: Full    Dental  (+) Teeth Intact, Dental Advisory Given   Pulmonary asthma , sleep apnea and Continuous Positive Airway Pressure Ventilation , COPD,  COPD inhaler   Pulmonary exam normal breath sounds clear to auscultation       Cardiovascular hypertension, Pt. on home beta blockers and Pt. on medications + CAD, + Past MI and +CHF  + Cardiac Defibrillator + Valvular Problems/Murmurs MR  Rhythm:Regular Rate:Normal + Systolic murmurs Echo 12/11/23: 1. Left ventricular ejection fraction, by estimation, is 30 to 35%. The  left ventricle has moderately decreased function. The left ventricle  demonstrates global hypokinesis. The left ventricular internal cavity size  was moderately dilated. Left  ventricular diastolic parameters are consistent with Grade I diastolic  dysfunction (impaired relaxation).   2. Right ventricular systolic function is normal. The right ventricular  size is normal. Tricuspid regurgitation signal is inadequate for assessing  PA pressure.   3. The mitral valve is abnormal. Mild mitral valve regurgitation. No  evidence of mitral stenosis.   4. The tricuspid valve is abnormal.   5. The aortic valve is tricuspid. Aortic valve regurgitation is not  visualized. No aortic stenosis is present.   6. Aortic dilatation noted. There is mild dilatation of the ascending  aorta, measuring 36 mm.   7. The inferior vena cava is normal in size with greater than 50%  respiratory variability, suggesting right atrial pressure of 3 mmHg.     Neuro/Psych negative neurological ROS     GI/Hepatic ,GERD  Medicated,,Pancreas mass   Endo/Other   Obesity   Renal/GU ESRF and DialysisRenal disease     Musculoskeletal negative musculoskeletal ROS (+)    Abdominal   Peds  Hematology  (+) Blood dyscrasia (Eliquis), anemia   Anesthesia Other Findings Day of surgery medications reviewed with the patient.  Reproductive/Obstetrics                              Anesthesia Physical Anesthesia Plan  ASA: 4  Anesthesia Plan: MAC   Post-op Pain Management: Minimal or no pain anticipated   Induction: Intravenous  PONV Risk Score and Plan: 1 and TIVA and Treatment may vary due to age or medical condition  Airway Management Planned: Natural Airway and Simple Face Mask  Additional Equipment:   Intra-op Plan:   Post-operative Plan:   Informed Consent: I have reviewed the patients History and Physical, chart, labs and discussed the procedure including the risks, benefits and alternatives for the proposed anesthesia with the patient or authorized representative who has indicated his/her understanding and acceptance.     Dental advisory given  Plan Discussed with: CRNA  Anesthesia Plan Comments:          Anesthesia Quick Evaluation

## 2023-12-23 NOTE — Op Note (Addendum)
[facility] [name] Procedure Date : 12/23/2023 MRN: [id number] Attending MD: Corliss Parish , MD, [id number] Date of Birth: Nov 19, 1961 CSN: [id number] Age: 63 Admit Type: Inpatient Procedure:                Upper EUS Indications:              Suspected mass in pancreas on CT scan Providers:                Corliss Parish, MD, Stephens Shire RN, RN, Priscella Mann, Technician Referring MD:              Medicines:                Monitored Anesthesia Care Complications:            Hypotension Estimated Blood Loss:     Estimated blood loss was minimal. Procedure:                Pre-Anesthesia Assessment:                           - Prior to the procedure, a History and Physical                            was performed, and patient medications and                            allergies were reviewed. The patient's tolerance of                            previous anesthesia was also reviewed. The risks                            and benefits of the procedure and the sedation                            options and risks were discussed with the patient.                            All questions were answered, and informed consent                            was obtained. Prior Anticoagulants: The patient has                            taken heparin, last dose was day of procedure. ASA                            Grade Assessment: III - A patient with severe                            systemic disease. After reviewing the risks and  benefits, the patient was deemed in satisfactory                            condition to undergo the procedure.                           After obtaining informed consent, the endoscope was                            passed under direct vision. Throughout the                            procedure, the patient's blood pressure, pulse, and                            oxygen  saturations were monitored continuously. The                            [id number]-[id number] ([id number]) Olympus endoscope was introduced                            through the mouth, and advanced to the second part                            of duodenum. The [id number] ([id number]) Olympus linear                            ultrasound scope was introduced through the mouth,                            and advanced to the duodenum for ultrasound                            examination from the stomach and duodenum. The                            upper EUS was accomplished without difficulty. The                            patient tolerated the procedure poorly due to the                            patient's cardiovascular instability (hypotension). Scope In: Scope Out: Findings:      ENDOSCOPIC FINDING: :      No gross lesions were noted in the entire esophagus. The esophagus is       much smoother than on previous EUS.      Patchy mild inflammation characterized by erosions and erythema was       found in the entire examined stomach. Previously biopsied so not redone.      Patchy mildly erythematous mucosa without active bleeding was found in       the duodenal bulb, in the first portion of the duodenum and in the       second portion of the duodenum.  ENDOSONOGRAPHIC FINDING: :      An irregular masslike region was identified in the pancreatic head. The       mass was heterogenous in appearance. The area measured 34 mm by 25 mm in       maximal cross-sectional diameter. The outer margins were irregular. An       intact interface was seen between the mass and the superior mesenteric       artery and celiac trunk suggesting a lack of invasion. There was       evidence of peripancreatic free fluid surrounding the pancreas most       likely in the setting of recent pancreatitis. Fine needle biopsy was       performed of the masslike area. Color Doppler imaging was utilized prior       to needle puncture  to confirm a lack of significant vascular structures       within the needle path. Six passes were made with the 22 gauge Acquire       biopsy needle using a transduodenal approach. A visible core of tissue       was obtained. Preliminary cytologic examination and touch preps were       performed. Final cytology results are pending. Due to patient's clinical       status, we expedited the biopsies and did not get absolute visualization       of the vasculature in station 3/station 4.      Endosonographic imaging in the visualized portion of the liver showed no.      The celiac region was visualized. Impression:               EGD impression:                           - No gross lesions in the entire esophagus.                           - Gastritis.                           - Erythematous duodenopathy.                           EUS impression:                           - A mass was identified in the pancreatic head.                            Cytology results are pending. However, the                            endosonographic appearance is not overtly                            suggestive of malignancy, but with imaging findings                            concerning and the potential sample error on last  EUS, repeat sampling was performed. Expedited                            biopsies taken, due to patient's clinical status                            during procedure. Recommendation:           - The patient will be observed post-procedure,                            until all discharge criteria are met.                           - Return patient to hospital ward for ongoing care.                           - Observe patient's clinical course. Pending                            stability will head back to floor but blood                            pressure needs to be monitored closely.                           - Await cytology results.                            - May restart heparin drip at 10 PM. DOAC can be                            reinitiated in 36 hours (12/25/2023). This is in an                            effort of trying to decrease risk of post                            interventional bleeding.                           - Watch for signs/symptoms of pancreatitis                            development with today's procedure.                           -If repeat sampling still shows no evidence of                            malignancy, then I would recommend trying to                            minimize repeat procedures for at least a few  weeks. Repeat imaging as an outpatient could be                            considered and if there is still a masslike region,                            then consideration of repeat EUS at quatern[facility]                            center could be had for another in the sonographer                            to evaluate the area as well.                           - The findings and recommendations were discussed                            with the patient.                           - The findings and recommendations were discussed                            with the referring physician. Procedure Code(s):        --- Professional ---                           [id number], Esophagogastroduodenoscopy, flexible,                            transoral; with transendoscopic ultrasound-guided                            intramural or transmural fine needle                            aspiration/biopsy(s), (includes endoscopic                            ultrasound examination limited to the esophagus,                            stomach or duodenum, and adjacent structures) Diagnosis Code(s):        --- Professional ---                           K29.70, Gastritis, unspecified, without bleeding                           K31.89, Other diseases of stomach and duodenum                           K86.89,  Other specified diseases of pancreas  R93.3, Abnormal findings on diagnostic imaging of                            other parts of digestive tract CPT copyright 2022 [employer]. All rights reserved. The codes documented in this report are preliminary and upon coder review may  be revised to meet current compliance requirements. Corliss Parish, MD 12/23/2023 10:35:16 AM Number of Addenda: 0

## 2023-12-23 NOTE — Transfer of Care (Signed)
Immediate Anesthesia Transfer of Care Note  Patient: Victor Thompson  Procedure(s) Performed: ESOPHAGOGASTRODUODENOSCOPY (EGD) WITH PROPOFOL UPPER ESOPHAGEAL ENDOSCOPIC ULTRASOUND (EUS) FINE NEEDLE ASPIRATION (FNA) LINEAR  Patient Location: PACU  Anesthesia Type:MAC  Level of Consciousness: oriented and sedated  Airway & Oxygen Therapy: Patient Spontanous Breathing and Patient connected to nasal cannula oxygen  Post-op Assessment: Report given to RN and Post -op Vital signs reviewed and stable  Post vital signs: Reviewed and stable  Last Vitals:  Vitals Value Taken Time  BP 96/75 12/23/23 1024  Temp    Pulse 77 12/23/23 1027  Resp 23 12/23/23 1027  SpO2 96 % 12/23/23 1027  Vitals shown include unfiled device data.  Last Pain:  Vitals:   12/23/23 0841  TempSrc: Temporal  PainSc: 0-No pain      Patients Stated Pain Goal: 0 (12/22/23 1349)  Complications: No notable events documented.

## 2023-12-23 NOTE — Interval H&P Note (Signed)
History and Physical Interval Note:  12/23/2023 9:14 AM  Victor Thompson  has presented today for surgery, with the diagnosis of Pancreas mass.  The various methods of treatment have been discussed with the patient and family. After consideration of risks, benefits and other options for treatment, the patient has consented to  Procedure(s): ESOPHAGOGASTRODUODENOSCOPY (EGD) WITH PROPOFOL (N/A) UPPER ESOPHAGEAL ENDOSCOPIC ULTRASOUND (EUS) (N/A) as a surgical intervention.  The patient's history has been reviewed, patient examined, no change in status, stable for surgery.  I have reviewed the patient's chart and labs.  Questions were answered to the patient's satisfaction.    The risks of an EUS including intestinal perforation, bleeding, infection, aspiration, and medication effects were discussed as was the possibility it may not give a definitive diagnosis if a biopsy is performed.  When a biopsy of the pancreas is done as part of the EUS, there is an additional risk of pancreatitis at the rate of about 1-2%.  It was explained that procedure related pancreatitis is typically mild, although it can be severe and even life threatening, which is why we do not perform random pancreatic biopsies and only biopsy a lesion/area we feel is concerning enough to warrant the risk.    [employer]

## 2023-12-23 NOTE — Progress Notes (Signed)
[facility] Progress Note  Subjective: Seen in room, mother present; resting Had EGD with bx earlier today HD yesterday: 0.3L UF  Vitals:   12/23/23 1200 12/23/23 1300 12/23/23 1332 12/23/23 1421  BP: (!) 74/52 (!) 77/50 (!) 77/50 (!) 81/50  Pulse: 87     Resp:      Temp:      TempSrc:      SpO2: 100%   91%  Weight:      Height:        Exam: NAD, confused RRR no rub CTAB, coarse bs throughout +B/T of AVF S/nt/nd No LEE       Renal-related home meds: - eliquis 5 bid - coreg 6.25 bid, bidil 20-37.5 bid - fosrenol 2 gm ac tid - others: mexiletine 300 bid, PPI, symbicort, statin, amiodarone      OP HD: [facility]   4h   450 /1.5  102kg  2/2 bath   AVF   Heparin 3000 - hect 9 micrograms three times per week - no esa   CXR 1/04 on admission - no acute disease   Assessment/ Plan: Pancreatic mass - w/ abd pain, CT +for mass a head of pancreas, also multiple local enlarged LN's. SP EUS on 1/08 w/ biopsy appearing not malignant and more c/w with chronic pancreatitis. Rpt EGD 1/17 path pending;  High concern for malignancy ESKD - on HD TTS. Next HD 1/18 UF 1L max HTN - BP's soft. DC'd bidil for now. On MTP and prn midodrine; per cardiology 2/2 #8;  Volume - CXR negative. Under outpt EDW, adjust down at DC.  Gentle UF moving forward Anemia of eskd - Hb 11s, not on esa at OP unit. Follow.  Secondary hyperparathyroidism - CCa in range. Cont binders w/ meals.  H/o combined heart failure H/o VT storm/ ischemic CM/ PAF - sp ICD and takes mexilitene, BB and amiodarone. Cardiology is following. Goal for Cardiac PET 1/15 Debility / Decline: palliative following, FC  Arita Miss, MD  12/23/2023, 3:12 PM  Recent Labs  Lab 12/21/23 0640 12/21/23 2343 12/22/23 1221 12/23/23 0756  HGB  --  10.7*  --  10.4*  ALBUMIN 1.8*  --  1.6*  --   CALCIUM 8.6* 9.0 8.9 8.5*  PHOS  --  4.4 4.5  --   CREATININE 7.09* 7.78* 9.04* 6.36*  K 4.6 4.4 4.5 4.3   No  results for input(s): "IRON", "TIBC", "FERRITIN" in the last 168 hours.  Inpatient medications:  amiodarone  200 mg Oral BID   Chlorhexidine Gluconate Cloth  6 each Topical Q0600   docusate sodium  100 mg Oral BID   fluticasone furoate-vilanterol  1 puff Inhalation Daily   hydrOXYzine  25 mg Oral TID   lanthanum  2,000 mg Oral TID WC   And   lanthanum  1,000 mg Oral BID BM   metoprolol succinate  100 mg Oral Daily   mexiletine  300 mg Oral BID   midodrine  10 mg Oral TID WC   pantoprazole  40 mg Oral BID   polyethylene glycol  17 g Oral BID   sodium chloride flush  3 mL Intravenous Q12H    heparin      acetaminophen **OR** acetaminophen, bisacodyl, calcium carbonate, levalbuterol, nitroGLYCERIN, ondansetron **OR** ondansetron (ZOFRAN) IV, mouth rinse, senna, simethicone, sodium chloride flush

## 2023-12-23 NOTE — Progress Notes (Signed)
PT Cancellation Note  Patient Details Name: Victor Thompson MRN: [id number] DOB: October 31, 1961   Cancelled Treatment:    Reason Eval/Treat Not Completed: Patient at procedure or test/unavailable  Off unit for EGD this morning, will check back this afternoon as schedule permits.  Kathlyn Sacramento, PT, DPT [facility]  Re[facility] Services Physical Therapist Office: [phone] Website: [email or web address]  Berton Mount 12/23/2023, 9:22 AM

## 2023-12-23 NOTE — Progress Notes (Signed)
PROGRESS NOTE    Victor Thompson  WUJ:[id number] DOB: [birth year] DOA: 12/10/2023 PCP: [name], MD   Brief Narrative:  This 63 years old African-American male, with past medical history significant for ESRD on HD TTS schedule, Ischemic cardiomyopathy, congestive heart failure with reduced EF (25 to 30%), history of CAD, VT storm status post ICD, currently on Eliquis and amiodarone, reactive airway disease and hyperlipidemia presented with epigastric pain, shortness of breath, cough and chest pain.  Further workup in the ED showed pancreatic mass concerning for malignancy.  Patient was transferred to [facility] as patient will likely need endoscopic ultrasound guided biopsy of pancreatic mass.  GI is following him.  12/14/2023: He underwent EUS guided biopsy of the pancreatic mass .  On heparin drip.  Nephrology on board for Hemodialysis needs.  1/17: He underwent repeat EUS guided biopsy of pancreatic mass.  GI following  Assessment & Plan:   Principal Problem:   Pancreatic mass Active Problems:   Combined congestive systolic and diastolic heart failure (HCC)   Hypotension   Hypokalemia   Elevated troponin   Chest pain   Reactive airway disease   ESRD (end stage renal disease) (HCC)   History of ventricular tachycardia s/p ICD   Abdominal pain   Gastritis and gastroduodenitis   Idiopathic acute pancreatitis without infection or necrosis   Pancreatic lesion   Abnormal CT of the abdomen   History of pancreatitis  Pancreatic Mass: GI consulted. Patient s/p EUS on 12/14/23 with biopsy consistent with chronic pancreatitis, does not appear malignant. LFT's had been trending up post-procedure Follow up MRCP was recommended, however patient refused.  He underwent Repeat EUS with FNA on 1/17 as per GI.  Follow-up GI for further recommendation.  Essential Hypertension: Patient is now hypotensive requiring midodrine as needed, Cardiology following , Continue metoprolol  with hold parameters.  Combined systolic and diastolic CHF: Continued metoprolol with hold parameters, bidil was discontinued. No evidence of acute CHF exacerbation.   H/o ventricular tachycardia s/p ICD: Continue Amiodarone, metoprolol, mexiletine. Earlier patient continued to have runs of VT here, asymptomatic Per records, patient had elected to disable ICD in the past, does not want it on again. EP and Cardiology following.   Elevated troponin: Suspect secondary to demand from recurrent VT. Continue IV heparin.  Reactive airway disease: COVID, flu and RSV negative.   Chest x-ray unremarkable.  CTA chest unremarkable. Continue with Breo Ellipta twice daily and Xopenex every 6 hour as needed for any wheezing,  shortness of breath   ESRD on dialysis TTS schedule: Last dialysis 12/22/22 with 3.5L removed. No sob. Nephrology on board for HD.   Obesity, Class II: BMI 36.64 Diet, exercise and weight reduction advised. Continue CPAP at night for OSA.   Constipation: Continue bowel regimen.   Post-ERCP pancreatitis: Lipase peaked to over 500's, later improved. LFT's had trended up. Now showing downtrend Patient refused MRCP. Now underwent EUS with FNA on 1/17.   End of Life: Palliative Care consulted, wishes confirmed to be DNR Later family and patient decided to be full code.   Toxic metabolic encephalopathy: > Resolved. Unclear etiology, but suspect may be related to hypotension recently Head CT reviewed, Negative for acute process Ordered CXR and blood cx to r/o infectious causes, however pt refused CXR and blood draws this AM Ammonia was normal  DVT prophylaxis: Heparin Code Status: DNR Family Communication: Mother at bedside. Disposition Plan:    Status is: Inpatient Remains inpatient appropriate because: Severity of illness  Consultants:  Gastroenterology  Procedures:  EUS with FNA Antimicrobials:  Anti-infectives (From admission, onward)    None       Subjective: Patient was seen and examined at bedside. Overnight events noted.   Patient reports feeling better,  He underwent EUS with pancreatic biopsy today Postprocedure his blood pressure remains low. He has received fluid boluses.  Objective: Vitals:   12/23/23 1157 12/23/23 1200 12/23/23 1300 12/23/23 1332  BP: (!) 71/49 (!) 74/52 (!) 77/50 (!) 77/50  Pulse:  87    Resp:      Temp:      TempSrc:      SpO2:  100%    Weight:      Height:        Intake/Output Summary (Last 24 hours) at 12/23/2023 1342 Last data filed at 12/23/2023 1027 Gross per 24 hour  Intake 556.41 ml  Output -300 ml  Net 856.41 ml   Filed Weights   12/23/23 0426 12/23/23 0427 12/23/23 0841  Weight: 103 kg 103.3 kg 103.3 kg    Examination:  General exam: Appears comfortable, deconditioned, sick looking, not in any distress. Respiratory system: CTA bilaterally. Respiratory effort normal.  RR 15 Cardiovascular system: S1 & S2 heard, RRR. No JVD, murmurs, rubs, gallops or clicks.  Gastrointestinal system: Abdomen is non distended, soft and  mildly tender.  Normal bowel sounds heard. Central nervous system: Alert and oriented x 3. No focal neurological deficits. Extremities: No edema, no cyanosis, no clubbing Skin: No rashes, lesions or ulcers Psychiatry: Judgement and insight appear normal. Mood & affect appropriate.     Data Reviewed: I have personally reviewed following labs and imaging studies  CBC: Recent Labs  Lab 12/18/23 0653 12/19/23 0809 12/20/23 1404 12/21/23 2343 12/23/23 0756  WBC 11.3* 9.1 9.5 7.3 7.1  HGB 11.1* 11.5* 10.7* 10.7* 10.4*  HCT 32.2* 33.4* 30.2* 30.6* 29.9*  MCV 86.1 85.0 84.1 84.3 83.8  PLT 181 216 228 232 233   Basic Metabolic Panel: Recent Labs  Lab 12/20/23 1404 12/21/23 0640 12/21/23 2343 12/22/23 1221 12/23/23 0756  NA 132* 133* 131* 132* 133*  K 4.6 4.6 4.4 4.5 4.3  CL 95* 94* 95* 94* 95*  CO2 23 25 24 23 24   GLUCOSE 80 91 86 79 81  BUN 50*  32* 41* 49* 31*  CREATININE 10.96* 7.09* 7.78* 9.04* 6.36*  CALCIUM 8.8* 8.6* 9.0 8.9 8.5*  MG  --   --  2.1  --   --   PHOS  --   --  4.4 4.5  --    GFR: Estimated Creatinine Clearance: 14.2 mL/min (A) (by C-G formula based on SCr of 6.36 mg/dL (H)). Liver Function Tests: Recent Labs  Lab 12/17/23 0706 12/18/23 0653 12/19/23 0809 12/20/23 1404 12/21/23 0640 12/22/23 1221  AST 381* 595* 452* 305* 354*  --   ALT 201* 288* 295* 237* 251*  --   ALKPHOS 135* 159* 194* 189* 191*  --   BILITOT 1.1 1.7* 1.7* 1.8* 2.5*  --   PROT 6.1* 6.0* 6.4* 5.7* 6.1*  --   ALBUMIN 2.0* 1.8* 1.9* 1.7* 1.8* 1.6*   Recent Labs  Lab 12/17/23 0929  LIPASE 116*   Recent Labs  Lab 12/19/23 1217  AMMONIA 30   Coagulation Profile: No results for input(s): "INR", "PROTIME" in the last 168 hours. Cardiac Enzymes: No results for input(s): "CKTOTAL", "CKMB", "CKMBINDEX", "TROPONINI" in the last 168 hours. BNP (last 3 results) No results for input(s): "PROBNP" in the  last 8760 hours. HbA1C: No results for input(s): "HGBA1C" in the last 72 hours. CBG: No results for input(s): "GLUCAP" in the last 168 hours. Lipid Profile: No results for input(s): "CHOL", "HDL", "LDLCALC", "TRIG", "CHOLHDL", "LDLDIRECT" in the last 72 hours. Thyroid Function Tests: No results for input(s): "TSH", "T4TOTAL", "FREET4", "T3FREE", "THYROIDAB" in the last 72 hours. Anemia Panel: No results for input(s): "VITAMINB12", "FOLATE", "FERRITIN", "TIBC", "IRON", "RETICCTPCT" in the last 72 hours. Sepsis Labs: No results for input(s): "PROCALCITON", "LATICACIDVEN" in the last 168 hours.  Recent Results (from the past 240 hours)  Culture, blood (Routine X 2) w Reflex to ID Panel     Status: None (Preliminary result)   Collection Time: 12/20/23 10:38 PM   Specimen: BLOOD  Result Value Ref Range Status   Specimen Description BLOOD SITE NOT SPECIFIED  Final   Special Requests   Final    BOTTLES DRAWN AEROBIC AND ANAEROBIC Blood  Culture results may not be optimal due to an inadequate volume of blood received in culture bottles   Culture   Final    NO GROWTH 3 DAYS Performed at [facility], [address], [place], [place] [address]    Report Status PENDING  Incomplete  Culture, blood (Routine X 2) w Reflex to ID Panel     Status: None (Preliminary result)   Collection Time: 12/20/23 10:40 PM   Specimen: BLOOD  Result Value Ref Range Status   Specimen Description BLOOD SITE NOT SPECIFIED  Final   Special Requests   Final    BOTTLES DRAWN AEROBIC AND ANAEROBIC Blood Culture results may not be optimal due to an inadequate volume of blood received in culture bottles   Culture   Final    NO GROWTH 3 DAYS Performed at [facility], [address] [place], [place] [address]    Report Status PENDING  Incomplete    Radiology Studies: No results found.  Scheduled Meds:  amiodarone  200 mg Oral BID   Chlorhexidine Gluconate Cloth  6 each Topical Q0600   docusate sodium  100 mg Oral BID   fluticasone furoate-vilanterol  1 puff Inhalation Daily   hydrOXYzine  25 mg Oral TID   lanthanum  2,000 mg Oral TID WC   And   lanthanum  1,000 mg Oral BID BM   metoprolol succinate  100 mg Oral Daily   mexiletine  300 mg Oral BID   pantoprazole  40 mg Oral BID   polyethylene glycol  17 g Oral BID   sodium chloride flush  3 mL Intravenous Q12H   Continuous Infusions:     LOS: 13 days    Time spent: 35 mins    Willeen Niece, MD [facility]   If 7PM-7AM, please contact night-coverage

## 2023-12-23 NOTE — Progress Notes (Signed)
Pt arrived from endoscopy with complaints of feeling nauseous. BP taken of 67/49 MAP 56. MD notified. 5 mg of midodrine given. Provider increased dosage to 10 mg  and fluid bolus administered. Pt alert and oriented.x 4. Vital sign checks per protocol

## 2023-12-23 NOTE — Plan of Care (Signed)
  Problem: Education: Goal: Knowledge of General Education information will improve Description: Including pain rating scale, medication(s)/side effects and non-pharmacologic comfort measures Outcome: Progressing   Problem: Health Behavior/Discharge Planning: Goal: Ability to manage health-related needs will improve Outcome: Progressing   Problem: Nutrition: Goal: Adequate nutrition will be maintained Outcome: Progressing   Problem: Activity: Goal: Risk for activity intolerance will decrease Outcome: Progressing   Problem: Elimination: Goal: Will not experience complications related to bowel motility Outcome: Progressing Goal: Will not experience complications related to urinary retention Outcome: Progressing

## 2023-12-23 NOTE — Progress Notes (Signed)
Physical Therapy Treatment Patient Details Name: Victor Thompson MRN: [id number] DOB: 08/15/1961 Today's Date: 12/23/2023   History of Present Illness Pt is a 63 y/o M admitted on 12/10/23 after presenting with c/o epigastric pain, SOB, cough & chest pain. Workup showed pancreatic mass concerning for malignancy. S/p Upper EUS 1/17. PMH: ESRD on HD TTS, ischemic cardiomyopathy, CHF with reduced EF, CAD, VT storm s/p ICD, reactive airway disease, HLD    PT Comments  Eager to work with therapy this afternoon, family present and supportive in room. Compression stockings donned due to soft BP throughout the day. Seated EOB BP from dynamap 124/89 HR 79; but after returning to supine while still wearing compression stockings 91/61 HR 78. Question accuracy of reading. He required less assistance from prior visits to rise to EOB, Mod assist +1, progressed to min assist +1 with practice from sidelying. Stood one time for <30 seconds with Mod assist, difficulty fully extending hips into upright posture but able to lock his knees for a period of time. Reviewed LE exercises and encouraged OOB with nursing staff over the weekend (RN Notified) as BP permits. Would like for CIR to take another look at patient due to his significant decline after initial PT evaluation, and now slowly improving function. Will continue to follow and work with pt as tolerated. Patient will continue to benefit from skilled physical therapy services to further improve independence with functional mobility.     If plan is discharge home, recommend the following: A lot of help with bathing/dressing/bathroom;Assistance with cooking/housework;Direct supervision/assist for medications management;Assist for transportation;Help with stairs or ramp for entrance;Supervision due to cognitive status;Two people to help with walking and/or transfers   Can travel by private vehicle     No  Equipment Recommendations  Rolling walker (2 wheels)     Recommendations for Other Services Rehab consult     Precautions / Restrictions Precautions Precautions: Fall Precaution Comments: watch BP Restrictions Weight Bearing Restrictions Per Provider Order: No     Mobility  Bed Mobility Overal bed mobility: Needs Assistance Bed Mobility: Supine to Sit, Sit to Supine, Sidelying to Sit   Sidelying to sit: Min assist, HOB elevated Supine to sit: Mod assist, HOB elevated, Used rails Sit to supine: Min assist   General bed mobility comments: Mod assist for trunk support and tactile cues to facilitate LEs out of bed. Took a break resting in sidelying and requires light min assist to rise from Lt sidelying pushing through elbow with cues and HOB elevated modestly. Min assist for LE support only to return to supine. Pt able to bridge and pull himself up in bed with LUE on rail.    Transfers Overall transfer level: Needs assistance Equipment used: 1 person hand held assist Transfers: Sit to/from Stand Sit to Stand: Mod assist           General transfer comment: Mod assist for boost and balance. Gross weakness, difficulty extending hips to stand fully upright with increased sway but capable of locking knees. Fatigued quickly.    Ambulation/Gait                   Stairs             Wheelchair Mobility     Tilt Bed    Modified Rankin (Stroke Patients Only)       Balance Overall balance assessment: Needs assistance Sitting-balance support: Feet supported, No upper extremity supported Sitting balance-Leahy Scale: Fair     Standing balance  support: During functional activity, Single extremity supported Standing balance-Leahy Scale: Poor Standing balance comment: Mod assist to remain standing                            Cognition Arousal: Lethargic Behavior During Therapy: WFL for tasks assessed/performed Overall Cognitive Status: Impaired/Different from baseline                                  General Comments: Suspect related to anesthesia. Pt able to joke with therapist but still off at times.        Exercises General Exercises - Lower Extremity Ankle Circles/Pumps: AROM, Both, 15 reps, Supine Quad Sets: Strengthening, Both, 10 reps, Supine Gluteal Sets: Strengthening, Both, 10 reps, Supine Long Arc Quad: Strengthening, Both, Seated, 10 reps    General Comments General comments (skin integrity, edema, etc.): Sitting EOB with compression stockings BP 124/89 HR 79 + dizziness. After returning to supine with stockings still on BP 91/61 HR 78.      Pertinent Vitals/Pain Pain Assessment Pain Assessment: Faces Faces Pain Scale: Hurts little more Pain Location: feet and LEs Pain Descriptors / Indicators: Discomfort, Grimacing Pain Intervention(s): Monitored during session, Repositioned    Home Living                          Prior Function            PT Goals (current goals can now be found in the care plan section) Acute Rehab PT Goals Patient Stated Goal: not stated PT Goal Formulation: With patient/family Time For Goal Achievement: 12/30/23 Potential to Achieve Goals: Good Progress towards PT goals: Progressing toward goals    Frequency    Min 1X/week      PT Plan      Co-evaluation              AM-PAC PT "6 Clicks" Mobility   Outcome Measure  Help needed turning from your back to your side while in a flat bed without using bedrails?: A Lot Help needed moving from lying on your back to sitting on the side of a flat bed without using bedrails?: A Lot Help needed moving to and from a bed to a chair (including a wheelchair)?: Total Help needed standing up from a chair using your arms (e.g., wheelchair or bedside chair)?: A Lot Help needed to walk in hospital room?: Total Help needed climbing 3-5 steps with a railing? : Total 6 Click Score: 9    End of Session Equipment Utilized During Treatment: Gait  belt;Oxygen Activity Tolerance: Patient tolerated treatment well;Patient limited by fatigue Patient left: in bed;with call bell/phone within reach;with bed alarm set;with family/visitor present Nurse Communication: Mobility status (BP) PT Visit Diagnosis: Muscle weakness (generalized) (M62.81);Other abnormalities of gait and mobility (R26.89);Unsteadiness on feet (R26.81);Difficulty in walking, not elsewhere classified (R26.2);Pain Pain - Right/Left: Right Pain - part of body:  (leg)     Time: 4098-1191 PT Time Calculation (min) (ACUTE ONLY): 29 min  Charges:    $Therapeutic Exercise: 8-22 mins $Therapeutic Activity: 8-22 mins PT General Charges $$ ACUTE PT VISIT: 1 Visit                     Kathlyn Sacramento, PT, DPT [facility]  Rehabilitation Services Physical Therapist Office: [phone] Website: [email or web address]    Berton Mount  12/23/2023, 5:44 PM

## 2023-12-24 DIAGNOSIS — K869 Disease of pancreas, unspecified: Secondary | ICD-10-CM

## 2023-12-24 DIAGNOSIS — Z8719 Personal history of other diseases of the digestive system: Secondary | ICD-10-CM | POA: Diagnosis not present

## 2023-12-24 DIAGNOSIS — K8500 Idiopathic acute pancreatitis without necrosis or infection: Secondary | ICD-10-CM | POA: Diagnosis not present

## 2023-12-24 DIAGNOSIS — K8689 Other specified diseases of pancreas: Secondary | ICD-10-CM | POA: Diagnosis not present

## 2023-12-24 DIAGNOSIS — R109 Unspecified abdominal pain: Secondary | ICD-10-CM | POA: Diagnosis not present

## 2023-12-24 DIAGNOSIS — N186 End stage renal disease: Secondary | ICD-10-CM | POA: Diagnosis not present

## 2023-12-24 DIAGNOSIS — I5042 Chronic combined systolic (congestive) and diastolic (congestive) heart failure: Secondary | ICD-10-CM | POA: Diagnosis not present

## 2023-12-24 LAB — COMPREHENSIVE METABOLIC PANEL
ALT: 178 U/L — ABNORMAL HIGH (ref 0–44)
AST: 197 U/L — ABNORMAL HIGH (ref 15–41)
Albumin: 1.6 g/dL — ABNORMAL LOW (ref 3.5–5.0)
Alkaline Phosphatase: 166 U/L — ABNORMAL HIGH (ref 38–126)
Anion gap: 14 (ref 5–15)
BUN: 43 mg/dL — ABNORMAL HIGH (ref 6–20)
CO2: 23 mmol/L (ref 22–32)
Calcium: 9 mg/dL (ref 8.9–10.3)
Chloride: 95 mmol/L — ABNORMAL LOW (ref 98–111)
Creatinine, Ser: 7.8 mg/dL — ABNORMAL HIGH (ref 0.61–1.24)
GFR, Estimated: 7 mL/min — ABNORMAL LOW (ref >60)
Glucose, Bld: 102 mg/dL — ABNORMAL HIGH (ref 70–99)
Potassium: 4.1 mmol/L (ref 3.5–5.1)
Sodium: 132 mmol/L — ABNORMAL LOW (ref 135–145)
Total Bilirubin: 2.5 mg/dL — ABNORMAL HIGH (ref 0.0–1.2)
Total Protein: 5.7 g/dL — ABNORMAL LOW (ref 6.5–8.1)

## 2023-12-24 LAB — CBC
HCT: 27.8 % — ABNORMAL LOW (ref 39.0–52.0)
Hemoglobin: 9.7 g/dL — ABNORMAL LOW (ref 13.0–17.0)
MCH: 29.4 pg (ref 26.0–34.0)
MCHC: 34.9 g/dL (ref 30.0–36.0)
MCV: 84.2 fL (ref 80.0–100.0)
Platelets: 273 10*3/uL (ref 150–400)
RBC: 3.3 MIL/uL — ABNORMAL LOW (ref 4.22–5.81)
RDW: 17.5 % — ABNORMAL HIGH (ref 11.5–15.5)
WBC: 7.9 10*3/uL (ref 4.0–10.5)
nRBC: 0 % (ref 0.0–0.2)

## 2023-12-24 LAB — CEA: CEA: 5.6 ng/mL — ABNORMAL HIGH (ref 0.0–4.7)

## 2023-12-24 LAB — HEPARIN LEVEL (UNFRACTIONATED)
Heparin Unfractionated: 0.26 [IU]/mL — ABNORMAL LOW (ref 0.30–0.70)
Heparin Unfractionated: 0.38 [IU]/mL (ref 0.30–0.70)

## 2023-12-24 MED ORDER — PENTAFLUOROPROP-TETRAFLUOROETH EX AERO: 1.0000 | INHALATION_SPRAY | CUTANEOUS | Status: DC | PRN

## 2023-12-24 NOTE — Progress Notes (Signed)
Pt tolerated tx well.UF off d/t hypotension.  12/24/23 1924  Vitals  Temp 98 F (36.7 C)  Temp Source Oral  BP 112/88  BP Location Left Arm  BP Method Automatic  Patient Position (if appropriate) Lying  Pulse Rate 73  Pulse Rate Source Monitor  Resp 19  Oxygen Therapy  SpO2 96 %  O2 Device Nasal Cannula  O2 Flow Rate (L/min) 2 L/min  Patient Activity (if Appropriate) In bed  Pulse Oximetry Type Continuous  During Treatment Monitoring  Blood Flow Rate (mL/min) 399 mL/min  Arterial Pressure (mmHg) -158.58 mmHg  Venous Pressure (mmHg) 249.89 mmHg  TMP (mmHg) -15.55 mmHg  Ultrafiltration Rate (mL/min) 0 mL/min  Dialysate Flow Rate (mL/min) 299 ml/min  Dialysate Potassium Concentration 3  Dialysate Calcium Concentration 2.5  Duration of HD Treatment -hour(s) 3 hour(s)  Cumulative Fluid Removed (mL) per Treatment  414.16  HD Safety Checks Performed Yes  Intra-Hemodialysis Comments Tx completed  Post Treatment  Dialyzer Clearance Lightly streaked  Hemodialysis Intake (mL) 0 mL  Liters Processed 72  Fluid Removed (mL) 400 mL  Tolerated HD Treatment Yes  Post-Hemodialysis Comments Pt stable.  Fistula / Graft Right Upper arm Arteriovenous fistula  Placement Date/Time: 05/25/22 0000   Orientation: Right  Access Location: Upper arm  Access Type: Arteriovenous fistula  Site Condition No complications  Fistula / Graft Assessment Present;Thrill;Bruit  Status Deaccessed  Needle Size 15  Drainage Description None

## 2023-12-24 NOTE — Progress Notes (Signed)
   12/24/23 0015  BiPAP/CPAP/SIPAP  $ Non-Invasive Home Ventilator  Subsequent  BiPAP/CPAP/SIPAP Pt Type Adult  BiPAP/CPAP/SIPAP Resmed  Mask Type Full face mask  Mask Size Large  EPAP 8 cmH2O  Flow Rate 2 lpm  Patient Home Equipment No  Auto Titrate No  CPAP/SIPAP surface wiped down Yes  BiPAP/CPAP /SiPAP Vitals  SpO2 97 %

## 2023-12-24 NOTE — Progress Notes (Signed)
Progress Note   LOS: 14 days   Chief Complaint: pancreatic head mass    Subjective   Patient denies any abdominal pain today.  His appetite has not been great per the patient's mom.  Patient passed a stool today but it was nonbloody   Objective   Vital signs in last 24 hours: Temp:  [97.5 F (36.4 C)-98 F (36.7 C)] 98 F (36.7 C) (01/18 1527) Pulse Rate:  [72-80] 73 (01/18 1800) Resp:  [17-28] 24 (01/18 1800) BP: (91-121)/(44-79) 91/44 (01/18 1800) SpO2:  [91 %-97 %] 97 % (01/18 1800) FiO2 (%):  [21 %] 21 % (01/18 0015) Weight:  [100.8 kg-106.3 kg] 100.8 kg (01/18 1527) Last BM Date : 12/24/23 Last BM recorded by nurses in past 5 days Stool Type: Type 4 (Like a smooth, soft sausage or snake) (12/23/2023  1:00 PM)  General:   male in no acute distress  Heart:  Regular rate and rhythm; no murmurs Pulm: Clear anteriorly; no wheezing Abdomen: soft, nondistended, normal bowel sounds in all quadrants.  Nontender Extremities:  No edema Neurologic:  Alert and  oriented x4;  No focal deficits.  Psych:  Cooperative. Normal mood and affect.  Intake/Output from previous day: 01/17 0701 - 01/18 0700 In: 200 [I.V.:200] Out: -  Intake/Output this shift: No intake/output data recorded.  Studies/Results: No results found.   Lab Results: Recent Labs    12/21/23 2343 12/23/23 0756 12/24/23 0706  WBC 7.3 7.1 7.9  HGB 10.7* 10.4* 9.7*  HCT 30.6* 29.9* 27.8*  PLT 232 233 273   BMET Recent Labs    12/22/23 1221 12/23/23 0756 12/24/23 0706  NA 132* 133* 132*  K 4.5 4.3 4.1  CL 94* 95* 95*  CO2 23 24 23   GLUCOSE 79 81 102*  BUN 49* 31* 43*  CREATININE 9.04* 6.36* 7.80*  CALCIUM 8.9 8.5* 9.0   LFT Recent Labs    12/24/23 0706  PROT 5.7*  ALBUMIN 1.6*  AST 197*  ALT 178*  ALKPHOS 166*  BILITOT 2.5*   PT/INR No results for input(s): "LABPROT", "INR" in the last 72 hours.   Scheduled Meds:  amiodarone  200 mg Oral BID   Chlorhexidine Gluconate Cloth  6  each Topical Q0600   docusate sodium  100 mg Oral BID   fluticasone furoate-vilanterol  1 puff Inhalation Daily   hydrOXYzine  25 mg Oral TID   lanthanum  2,000 mg Oral TID WC   And   lanthanum  1,000 mg Oral BID BM   metoprolol succinate  100 mg Oral Daily   mexiletine  300 mg Oral BID   midodrine  10 mg Oral TID WC   pantoprazole  40 mg Oral BID   polyethylene glycol  17 g Oral BID   sodium chloride flush  3 mL Intravenous Q12H   Continuous Infusions:  heparin 1,500 Units/hr (12/24/23 0957)      Patient profile:   63 year old male with history of CHF status post AICD, CAD, end-stage renal disease on dialysis, sleep apnea presents with epigastric discomfort shortness of breath and chest pain, found to have a CT scan with a pancreas head mass with surrounding LAN and possible hypodense hepatic lesion.    Impression:   Pancreatic head mass with LAN and elevated LFTs concerning for primary malignancy  Pancreatitis EGD/EUS 1/8 showed white nummular lesions in esophageal mucosa, gastritis, prominent major and minor papilla, single duodenal polyp.  Masslike region in the superior pancreatic head that was  biopsied. Pathology from EUS showed changes compatible with chronic pancreatitis and was negative for malignancy. Gastric biopsies showed gastropathy that was negative for H pylori. 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 Duodenal polyp was benign.  Patient refused to go down for MRI so we opted for CT pancreas protocol instead.  CT pancreas protocol again showed a 5 cm pancreatic head mass with persistent upper abdominal lymphadenopathy concerning for metastatic malignancy.  Patient also has signs of pancreatitis and increased interval size in size of a fluid density lesion in the distal pancreatic body that could represent a pseudocyst or focal pancreatic duct dilation. CA19-9 was normal.  CEA was very mildly elevated at 5.6.  Patient underwent repeat EUS on 1/17  with pancreatic mass identified and biopsied.  His repeat EUS procedure had to be done quickly as a result of patient hypotension.  Patient's blood pressures remain low, though his blood pressures were low even before his repeat EUS procedure.  At this time the patient is not demonstrating any signs of post ERCP complications.  His hemoglobin down drifted slightly today, but the patient does not describe any signs of blood in his stools.  LFTs are down drifting and patient does not describe any worsening abdominal pain   Post EUS pancreatitis with possible pancreatic pseudocyst EUS 1/8 and had pain afterwards.   Patient's lipase was elevated a few days ago and subsequently down trended.     Normocytic anemia Hgb stable No overt GI bleeding Potentially secondary to CKD/anemia of chronic disease -Continue to trend hemoglobin   CHF EF 30%, history of AICD Echo with EF 30 to 35% grade 1 diastolic mild MR no aortic stenosis   Nonobstructive CAD with elevated troponins .No chest pain currently, most recent troponin 47 and stable   End-stage renal disease  on dialysis 3 times weekly   Plan:   -Follow-up results of chromogranin A -Follow-up cytology results from pancreatic mass fine-needle biopsy.  If this does not show any evidence of malignancy, repeat imaging as an outpatient could be considered and then a potential repeat EUS at a [facility] in the future -Patient's DOAC can be reinitiated 36 hours after his EUS procedure -GI will sign off for now.  Please call back if any new questions arise  Imogene Burn MD 12/24/2023, 6:23 PM

## 2023-12-24 NOTE — Progress Notes (Signed)
[facility] Progress Note  Subjective: Seen in room, mother present;  Asked questions about his prognosis, I told him it was very poor and explained why given GI, cardiac, ESRD issues. Explored goals of care, he is consistent in his desire to be a full code.  Suggested that we might not be able to help him get better as he desires For dialysis today   Vitals:   12/24/23 0015 12/24/23 0042 12/24/23 0534 12/24/23 0734  BP:  101/72 103/67 (!) 100/59  Pulse:  76 73 74  Resp:  18 17 18   Temp:  97.9 F (36.6 C) (!) 97.5 F (36.4 C) 98 F (36.7 C)  TempSrc:  Oral Oral Oral  SpO2: 97% 95% 93% 92%  Weight:   106.3 kg   Height:        Exam: NAD, confused RRR no rub CTAB, coarse bs throughout +B/T of AVF S/nt/nd No LEE       Renal-related home meds: - eliquis 5 bid - coreg 6.25 bid, bidil 20-37.5 bid - fosrenol 2 gm ac tid - others: mexiletine 300 bid, PPI, symbicort, statin, amiodarone      OP HD: [facility]   4h   450 /1.5  102kg  2/2 bath   AVF   Heparin 3000 - hect 9 micrograms three times per week - no esa   CXR 1/04 on admission - no acute disease   Assessment/ Plan: Pancreatic mass - w/ abd pain, CT +for mass a head of pancreas, also multiple local enlarged LN's. SP EUS on 1/08 w/ biopsy appearing not malignant and more c/w with chronic pancreatitis. Rpt EGD 1/17 path pending;  High concern for malignancy ESKD - on HD TTS. Next HD 1/18 UF 1L max HTN - BP's soft. DC'd bidil for now. On MTP and prn midodrine; per cardiology 2/2 #8;  Volume - CXR negative. Under outpt EDW, adjust down at DC.  Gentle UF moving forward Anemia of eskd - Hb 11s, not on esa at OP unit. Follow.  Secondary hyperparathyroidism - CCa in range. Cont binders w/ meals.  H/o combined heart failure H/o VT storm/ ischemic CM/ PAF - sp ICD and takes mexilitene, BB and amiodarone. Cardiology is following. Goal for Cardiac PET 1/15 Debility / Decline: palliative following, FC;    Arita Miss, MD  12/24/2023, 9:58 AM  Recent Labs  Lab 12/21/23 2343 12/22/23 1221 12/23/23 0756 12/24/23 0706  HGB 10.7*  --  10.4* 9.7*  ALBUMIN  --  1.6*  --  1.6*  CALCIUM 9.0 8.9 8.5* 9.0  PHOS 4.4 4.5  --   --   CREATININE 7.78* 9.04* 6.36* 7.80*  K 4.4 4.5 4.3 4.1   No results for input(s): "IRON", "TIBC", "FERRITIN" in the last 168 hours.  Inpatient medications:  amiodarone  200 mg Oral BID   Chlorhexidine Gluconate Cloth  6 each Topical Q0600   docusate sodium  100 mg Oral BID   fluticasone furoate-vilanterol  1 puff Inhalation Daily   hydrOXYzine  25 mg Oral TID   lanthanum  2,000 mg Oral TID WC   And   lanthanum  1,000 mg Oral BID BM   metoprolol succinate  100 mg Oral Daily   mexiletine  300 mg Oral BID   midodrine  10 mg Oral TID WC   pantoprazole  40 mg Oral BID   polyethylene glycol  17 g Oral BID   sodium chloride flush  3 mL Intravenous Q12H  heparin 1,500 Units/hr (12/24/23 0957)    acetaminophen **OR** acetaminophen, bisacodyl, calcium carbonate, levalbuterol, nitroGLYCERIN, ondansetron **OR** ondansetron (ZOFRAN) IV, mouth rinse, senna, simethicone, sodium chloride flush

## 2023-12-24 NOTE — Plan of Care (Signed)
  Problem: Education: Goal: Knowledge of General Education information will improve Description: Including pain rating scale, medication(s)/side effects and non-pharmacologic comfort measures Outcome: Progressing   Problem: Health Behavior/Discharge Planning: Goal: Ability to manage health-related needs will improve Outcome: Progressing   Problem: Clinical Measurements: Goal: Ability to maintain clinical measurements within normal limits will improve Outcome: Progressing Goal: Will remain free from infection Outcome: Progressing Goal: Diagnostic test results will improve Outcome: Progressing Goal: Respiratory complications will improve Outcome: Progressing Goal: Cardiovascular complication will be avoided Outcome: Progressing   Problem: Coping: Goal: Level of anxiety will decrease Outcome: Progressing   Problem: Elimination: Goal: Will not experience complications related to bowel motility Outcome: Progressing Goal: Will not experience complications related to urinary retention Outcome: Progressing   

## 2023-12-24 NOTE — Progress Notes (Signed)
PHARMACY - ANTICOAGULATION CONSULT NOTE  Pharmacy Consult for Heparin (Eliquis on hold) Indication: atrial fibrillation  Patient Measurements: Height: 5\' 7"  (170.2 cm) Weight: 106.3 kg (234 lb 5.6 oz) IBW/kg (Calculated) : 66.1 Heparin Dosing Weight: 89 kg  Vital Signs: Temp: 98 F (36.7 C) (01/18 0734) Temp Source: Oral (01/18 0734) BP: 97/62 (01/18 1132) Pulse Rate: 80 (01/18 1132)  Labs: Recent Labs    12/21/23 2343 12/22/23 0922 12/22/23 1221 12/23/23 0756 12/24/23 0706 12/24/23 0707 12/24/23 1507  HGB 10.7*  --   --  10.4* 9.7*  --   --   HCT 30.6*  --   --  29.9* 27.8*  --   --   PLT 232  --   --  233 273  --   --   HEPARINUNFRC 0.22* 0.32  --   --   --  0.26* 0.38  CREATININE 7.78*  --  9.04* 6.36* 7.80*  --   --    ESRD  Assessment: 63 yr old male on Eliquis 5 mg BID prior to admission for hx atrial fibrillation. Eliquis now held for EUS with FN biopsy of pancreatic head mass. Pharmacy consulted to restart IV heparin bridge at midnight 1/9 post procedure. Continuing on heparin with ongoing procedures. EUS planned for 1/17.  1/18 AM: Heparin level 0.26, subtherapeutic on heparin 1400 units/hr. Heparin appropriately held at 0420 yesterday for EUS. Heparin resumed at 2308 on 1/17. No issues with infusion running or signs of bleeding per RN. Hgb trending down to 9.7, PLT stable at 283.  Follow up level therapeutic   Goal of Therapy:  Heparin level 0.3-0.7 units/ml Monitor platelets by anticoagulation protocol: Yes   Plan:  Heparin drip at 1500 units/hr. Monitor heparin level, CBC, and s/sx of bleeding daily while on heparin F/u resuming PTA Eliquis  Okey Regal, PharmD 12/24/2023 3:44 PM

## 2023-12-24 NOTE — Progress Notes (Signed)
Pt off unit in dialysis

## 2023-12-24 NOTE — Progress Notes (Signed)
PHARMACY - ANTICOAGULATION CONSULT NOTE  Pharmacy Consult for Heparin (Eliquis on hold) Indication: atrial fibrillation  Patient Measurements: Height: 5\' 7"  (170.2 cm) Weight: 106.3 kg (234 lb 5.6 oz) IBW/kg (Calculated) : 66.1 Heparin Dosing Weight: 89 kg  Vital Signs: Temp: 98 F (36.7 C) (01/18 0734) Temp Source: Oral (01/18 0734) BP: 100/59 (01/18 0734) Pulse Rate: 74 (01/18 0734)  Labs: Recent Labs    12/21/23 2343 12/22/23 0922 12/22/23 1221 12/23/23 0756 12/24/23 0706 12/24/23 0707  HGB 10.7*  --   --  10.4* 9.7*  --   HCT 30.6*  --   --  29.9* 27.8*  --   PLT 232  --   --  233 273  --   HEPARINUNFRC 0.22* 0.32  --   --   --  0.26*  CREATININE 7.78*  --  9.04* 6.36* 7.80*  --    ESRD  Assessment: 63 yr old male on Eliquis 5 mg BID prior to admission for hx atrial fibrillation. Eliquis now held for EUS with FN biopsy of pancreatic head mass. Pharmacy consulted to restart IV heparin bridge at midnight 1/9 post procedure. Continuing on heparin with ongoing procedures. EUS planned for 1/17.  1/18 AM: Heparin level 0.26, subtherapeutic on heparin 1400 units/hr. Heparin appropriately held at 0420 yesterday for EUS. Heparin resumed at 2308 on 1/17. No issues with infusion running or signs of bleeding per RN. Hgb trending down to 9.7, PLT stable at 283.  Goal of Therapy:  Heparin level 0.3-0.7 units/ml Monitor platelets by anticoagulation protocol: Yes   Plan:  Increase heparin drip at 1500 units/hr. Recheck heparin level in 6 hours Monitor heparin level, CBC, and s/sx of bleeding daily while on heparin F/u resuming PTA Eliquis  Enos Fling, PharmD PGY-1 Acute Care Pharmacy Resident 12/24/2023 8:19 AM

## 2023-12-24 NOTE — Progress Notes (Signed)
[facility]                                                                               Craige Rzepka, is a 63 y.o. male, DOB - 12-Jul-1961, ZOX:[id number] Admit date - 12/10/2023    Outpatient Primary MD for the patient is [name], MD  LOS - 14  days    Brief summary   63 year old African-American male, with past medical history significant for ESRD on HD TTS schedule, ischemic cardiomyopathy, congestive heart failure with reduced EF (25 to 30%), history of CAD, VT storm status post ICD, currently on Eliquis and amiodarone, reactive airway disease and hyperlipidemia presented with epigastric pain, shortness of breath, cough and chest pain.  Further workup in the ED showed pancreatic mass concerning for malignancy.  Patient was transferred to [facility] as patient will likely need endoscopic ultrasound guided biopsy of pancreatic mass.  GI is following him.   12/14/2023:  EUS guided biopsy of the pancreatic mass planned for today.  On heparin drip.  Nephrology on board for Hemodialysis needs.  1/17: He underwent repeat EUS guided biopsy of pancreatic mass.  GI following    Assessment & Plan    Assessment and Plan:   Pancreatic mass S/p EUS on 1/8 and 1/17 Biopsies done and results pending.  GI on board .     Hypertension Better bp parameters this morning.  Asymptomatic.    Chronic combined systolic and diastolic CHF No evidence of CHF exacerbation.     H/o ventricular tachycardia as well as ICD Continue with amiodarone metoprolol and mexiletine.   Elevated troponin Probably secondary to demand ischemia from recurrent VT Patient currently on IV heparin    Reactive airway disease Continue with Breo Ellipta twice daily and Xopenex as needed Of note COVID, flu, RSV negative CTA chest unremarkable.  End-stage renal disease on dialysis with TTS schedule Nephrology on board   Obesity Diet, exercise and weight reduction  advised. Continue CPAP at night for OSA.   Constipation Resolved.    Post ERCP pancreatitis Slowly improving.  Liver enzymes are improved.     Toxic metabolic encephalopathy: > Resolved. Unclear etiology, but suspect may be related to hypotension recently Head CT reviewed, Negative for acute process Ordered CXR and blood cx to r/o infectious causes, however pt refused CXR and blood draws this AM Ammonia was normal     Estimated body mass index is 36.7 kg/m as calculated from the following:   Height as of this encounter: 5\' 7"  (1.702 m).   Weight as of this encounter: 106.3 kg.  Code Status: full code.  DVT Prophylaxis:  SCDs Start: 12/10/23 2201 Place TED hose Start: 12/10/23 2201   Level of Care: Level of care: Telemetry Medical Family Communication: Updated patient's family at bedside.   Disposition Plan:     PENDING.   Procedures:  EGD / EUS A mass was identified in the pancreatic head. , repeat sampling was performed.   Consultants:   Nephrology Gastroenterology.   Antimicrobials:   Anti-infectives (From admission, onward)    None        Medications  Scheduled Meds:  amiodarone  200  mg Oral BID   Chlorhexidine Gluconate Cloth  6 each Topical Q0600   docusate sodium  100 mg Oral BID   fluticasone furoate-vilanterol  1 puff Inhalation Daily   hydrOXYzine  25 mg Oral TID   lanthanum  2,000 mg Oral TID WC   And   lanthanum  1,000 mg Oral BID BM   metoprolol succinate  100 mg Oral Daily   mexiletine  300 mg Oral BID   midodrine  10 mg Oral TID WC   pantoprazole  40 mg Oral BID   polyethylene glycol  17 g Oral BID   sodium chloride flush  3 mL Intravenous Q12H   Continuous Infusions:  heparin 1,500 Units/hr (12/24/23 0957)   PRN Meds:.acetaminophen **OR** acetaminophen, bisacodyl, calcium carbonate, levalbuterol, nitroGLYCERIN, ondansetron **OR** ondansetron (ZOFRAN) IV, mouth rinse, pentafluoroprop-tetrafluoroeth, senna, simethicone, sodium  chloride flush    Subjective:   Victor Thompson was seen and examined today.   No new complaints.    Objective:   Vitals:   12/24/23 0042 12/24/23 0534 12/24/23 0734 12/24/23 1132  BP: 101/72 103/67 (!) 100/59 97/62  Pulse: 76 73 74 80  Resp: 18 17 18 19   Temp: 97.9 F (36.6 C) (!) 97.5 F (36.4 C) 98 F (36.7 C)   TempSrc: Oral Oral Oral   SpO2: 95% 93% 92% 93%  Weight:  106.3 kg    Height:       No intake or output data in the 24 hours ending 12/24/23 1454 Filed Weights   12/23/23 0427 12/23/23 0841 12/24/23 0534  Weight: 103.3 kg 103.3 kg 106.3 kg     Exam General exam: Appears calm and comfortable  Respiratory system: Clear to auscultation. Respiratory effort normal. Cardiovascular system: S1 & S2 heard, RRR. No JVD,  Gastrointestinal system: Abdomen is nondistended, soft and nontender.  Central nervous system: Alert and oriented. Extremities: Symmetric 5 x 5 power. Skin: No rashes,  Psychiatry: Mood & affect appropriate.     Data Reviewed:  I have personally reviewed following labs and imaging studies   CBC Lab Results  Component Value Date   WBC 7.9 12/24/2023   RBC 3.30 (L) 12/24/2023   HGB 9.7 (L) 12/24/2023   HCT 27.8 (L) 12/24/2023   MCV 84.2 12/24/2023   MCH 29.4 12/24/2023   PLT 273 12/24/2023   MCHC 34.9 12/24/2023   RDW 17.5 (H) 12/24/2023   LYMPHSABS 0.6 (L) 09/29/2022   MONOABS 0.8 09/29/2022   EOSABS 0.0 09/29/2022   BASOSABS 0.0 09/29/2022     Last metabolic panel Lab Results  Component Value Date   NA 132 (L) 12/24/2023   K 4.1 12/24/2023   CL 95 (L) 12/24/2023   CO2 23 12/24/2023   BUN 43 (H) 12/24/2023   CREATININE 7.80 (H) 12/24/2023   GLUCOSE 102 (H) 12/24/2023   GFRNONAA 7 (L) 12/24/2023   GFRAA 6 (L) 10/10/2020   CALCIUM 9.0 12/24/2023   PHOS 4.5 12/22/2023   PROT 5.7 (L) 12/24/2023   ALBUMIN 1.6 (L) 12/24/2023   LABGLOB 2.9 09/08/2021   AGRATIO 1.5 09/08/2021   BILITOT 2.5 (H) 12/24/2023   ALKPHOS 166 (H)  12/24/2023   AST 197 (H) 12/24/2023   ALT 178 (H) 12/24/2023   ANIONGAP 14 12/24/2023    CBG (last 3)  No results for input(s): "GLUCAP" in the last 72 hours.    Coagulation Profile: No results for input(s): "INR", "PROTIME" in the last 168 hours.   Radiology Studies: No results found.  Kathlen Mody M.D. [facility] 12/24/2023, 2:54 PM  Available via Epic secure chat 7am-7pm After 7 pm, please refer to night coverage provider listed on amion.

## 2023-12-25 ENCOUNTER — Encounter (HOSPITAL_COMMUNITY): Payer: Self-pay | Admitting: Gastroenterology

## 2023-12-25 DIAGNOSIS — Z8719 Personal history of other diseases of the digestive system: Secondary | ICD-10-CM | POA: Diagnosis not present

## 2023-12-25 DIAGNOSIS — K8689 Other specified diseases of pancreas: Secondary | ICD-10-CM | POA: Diagnosis not present

## 2023-12-25 DIAGNOSIS — K869 Disease of pancreas, unspecified: Secondary | ICD-10-CM | POA: Diagnosis not present

## 2023-12-25 DIAGNOSIS — I5042 Chronic combined systolic (congestive) and diastolic (congestive) heart failure: Secondary | ICD-10-CM | POA: Diagnosis not present

## 2023-12-25 LAB — CULTURE, BLOOD (ROUTINE X 2)
Culture: NO GROWTH
Culture: NO GROWTH

## 2023-12-25 LAB — CBC
HCT: 29.9 % — ABNORMAL LOW (ref 39.0–52.0)
Hemoglobin: 10.4 g/dL — ABNORMAL LOW (ref 13.0–17.0)
MCH: 29 pg (ref 26.0–34.0)
MCHC: 34.8 g/dL (ref 30.0–36.0)
MCV: 83.3 fL (ref 80.0–100.0)
Platelets: 300 10*3/uL (ref 150–400)
RBC: 3.59 MIL/uL — ABNORMAL LOW (ref 4.22–5.81)
RDW: 17.3 % — ABNORMAL HIGH (ref 11.5–15.5)
WBC: 11.1 10*3/uL — ABNORMAL HIGH (ref 4.0–10.5)
nRBC: 0.3 % — ABNORMAL HIGH (ref 0.0–0.2)

## 2023-12-25 LAB — HEPARIN LEVEL (UNFRACTIONATED): Heparin Unfractionated: 0.35 [IU]/mL (ref 0.30–0.70)

## 2023-12-25 MED ORDER — APIXABAN 5 MG PO TABS
5.0000 mg | ORAL_TABLET | Freq: Two times a day (BID) | ORAL | Status: DC
Start: 2023-12-25 — End: 2023-12-28
  Administered 2023-12-25 – 2023-12-27 (×5): 5 mg via ORAL
  Filled 2023-12-25 (×5): qty 1

## 2023-12-25 NOTE — Plan of Care (Signed)
  Problem: Education: Goal: Knowledge of General Education information will improve Description: Including pain rating scale, medication(s)/side effects and non-pharmacologic comfort measures Outcome: Progressing   Problem: Clinical Measurements: Goal: Ability to maintain clinical measurements within normal limits will improve Outcome: Progressing Goal: Will remain free from infection Outcome: Progressing Goal: Diagnostic test results will improve Outcome: Progressing Goal: Respiratory complications will improve Outcome: Progressing Goal: Cardiovascular complication will be avoided Outcome: Progressing   Problem: Nutrition: Goal: Adequate nutrition will be maintained Outcome: Progressing   Problem: Coping: Goal: Level of anxiety will decrease Outcome: Progressing   Problem: Elimination: Goal: Will not experience complications related to bowel motility Outcome: Progressing Goal: Will not experience complications related to urinary retention Outcome: Progressing   

## 2023-12-25 NOTE — Progress Notes (Signed)
   12/25/23 0018  BiPAP/CPAP/SIPAP  $ Non-Invasive Home Ventilator  Subsequent  BiPAP/CPAP/SIPAP Pt Type Adult  BiPAP/CPAP/SIPAP Resmed  Mask Type Full face mask  Mask Size Large  EPAP 8 cmH2O  FiO2 (%) 21 %  Patient Home Equipment No  Auto Titrate No  CPAP/SIPAP surface wiped down Yes  BiPAP/CPAP /SiPAP Vitals  SpO2 97 %

## 2023-12-25 NOTE — Progress Notes (Signed)
[facility] Progress Note  Subjective: Seen in room, mother present;  HD yesterday with 0.4 L UF   Vitals:   12/24/23 1955 12/25/23 0018 12/25/23 0429 12/25/23 0743  BP:   98/67 (!) 92/56  Pulse:   79 78  Resp:   17 18  Temp:   98.6 F (37 C) 98.6 F (37 C)  TempSrc:      SpO2:  97% 100% 94%  Weight: 100.4 kg  104.1 kg   Height:        Exam: NAD, confused intermittently RRR no rub CTAB, coarse bs throughout +B/T of AVF S/nt/nd No LEE       Renal-related home meds: - eliquis 5 bid - coreg 6.25 bid, bidil 20-37.5 bid - fosrenol 2 gm ac tid - others: mexiletine 300 bid, PPI, symbicort, statin, amiodarone      OP HD: [facility]   4h   450 /1.5  102kg  2/2 bath   AVF   Heparin 3000 - hect 9 micrograms three times per week - no esa   CXR 1/04 on admission - no acute disease   Assessment/ Plan: Pancreatic mass - w/ abd pain, CT +for mass a head of pancreas, also multiple local enlarged LN's. SP EUS on 1/08 w/ biopsy appearing not malignant and more c/w with chronic pancreatitis. Rpt EGD 1/17 path pending;  High concern for malignancy ESKD - on HD TTS. Next HD 1/21 UF 1L max HTN - BP's soft. DC'd bidil for now. On MTP and prn midodrine; per cardiology 2/2 #8;  Volume - CXR negative. Under outpt EDW, adjust down at DC.  Gentle UF moving forward Anemia of eskd - Hb 10.4, not on esa at OP unit. Follow.  No ESA while we evaluate for malignancy Secondary hyperparathyroidism - CCa in range. Cont binders w/ meals.  Phosphorus well-controlled H/o combined heart failure H/o VT storm/ ischemic CM/ PAF - sp ICD and takes mexilitene, BB and amiodarone. Cardiology is following. Goal for Cardiac PET 1/15 Debility / Decline: palliative following, FC;   Victor Miss, MD  12/25/2023, 10:01 AM  Recent Labs  Lab 12/21/23 2343 12/22/23 1221 12/23/23 0756 12/24/23 0706 12/25/23 0701  HGB 10.7*  --  10.4* 9.7* 10.4*  ALBUMIN  --  1.6*  --  1.6*  --   CALCIUM  9.0 8.9 8.5* 9.0  --   PHOS 4.4 4.5  --   --   --   CREATININE 7.78* 9.04* 6.36* 7.80*  --   K 4.4 4.5 4.3 4.1  --    No results for input(s): "IRON", "TIBC", "FERRITIN" in the last 168 hours.  Inpatient medications:  amiodarone  200 mg Oral BID   Chlorhexidine Gluconate Cloth  6 each Topical Q0600   docusate sodium  100 mg Oral BID   fluticasone furoate-vilanterol  1 puff Inhalation Daily   hydrOXYzine  25 mg Oral TID   lanthanum  2,000 mg Oral TID WC   And   lanthanum  1,000 mg Oral BID BM   metoprolol succinate  100 mg Oral Daily   mexiletine  300 mg Oral BID   midodrine  10 mg Oral TID WC   pantoprazole  40 mg Oral BID   polyethylene glycol  17 g Oral BID   sodium chloride flush  3 mL Intravenous Q12H    heparin 1,500 Units/hr (12/25/23 0214)    acetaminophen **OR** acetaminophen, bisacodyl, calcium carbonate, levalbuterol, nitroGLYCERIN, ondansetron **OR** ondansetron (ZOFRAN) IV, mouth  rinse, senna, simethicone, sodium chloride flush

## 2023-12-25 NOTE — Progress Notes (Signed)
[facility]                                                                               [name], is a 63 y.o. male, DOB - 30-Mar-1961, MVH:[id number] Admit date - 12/10/2023    Outpatient Primary MD for the patient is [name], MD  LOS - 15  days    Brief summary   63 year old African-American male, with past medical history significant for ESRD on HD TTS schedule, ischemic cardiomyopathy, congestive heart failure with reduced EF (25 to 30%), history of CAD, VT storm status post ICD, currently on Eliquis and amiodarone, reactive airway disease and hyperlipidemia presented with epigastric pain, shortness of breath, cough and chest pain.  Further workup in the ED showed pancreatic mass concerning for malignancy.  Patient was transferred to [facility] as patient will likely need endoscopic ultrasound guided biopsy of pancreatic mass.  GI is following him.   12/14/2023:  EUS guided biopsy of the pancreatic mass planned for today.  On heparin drip.  Nephrology on board for Hemodialysis needs.  1/17: He underwent repeat EUS guided biopsy of pancreatic mass, results are pending.  GI  signed off.    Assessment & Plan    Assessment and Plan:   Pancreatic mass S/p EUS on 1/8 and 1/17 Biopsies done and results pending. Patient requests to wait for the results as he reports having intermittent abdominal pain.  GI  signed off.     Hypertension Better BP parameters today.  Currently on midodrine 10 mg TID   Chronic combined systolic and diastolic CHF No evidence of CHF exacerbation.  Fluid management with HD.     H/o ventricular tachycardia as well as ICD Continue with amiodarone 200 mg BID, metoprolol 100 mg daily and mexiletine 300 MG DAILY . Continue with Eliquis.    Elevated troponin Probably secondary to demand ischemia from recurrent VT Currently denies any chest pain.     Reactive airway disease Continue with Breo Ellipta twice  daily and Xopenex as needed Of note COVID, flu, RSV negative CTA chest unremarkable.  End-stage renal disease on dialysis with TTS schedule Nephrology on board    Obesity Diet, exercise and weight reduction advised. Continue CPAP at night for OSA. Body mass index is 35.94 kg/m.    Constipation Resolved.    Post ERCP pancreatitis Slowly improving.  Liver enzymes are improved.  CT abd and pelvis on 1/13 showing pancreatitis.     Toxic metabolic encephalopathy: > Resolved. Unclear etiology, but suspect may be related to hypotension  CT head negative for acute pathology.  Mild leukocytosis.  Ammonia was normal  Anemia of chronic disease Hemoglobin stable around 10.    Hyponatremia from ESRD.      Estimated body mass index is 35.94 kg/m as calculated from the following:   Height as of this encounter: 5\' 7"  (1.702 m).   Weight as of this encounter: 104.1 kg.  Code Status: full code.  DVT Prophylaxis:  SCDs Start: 12/10/23 2201 Place TED hose Start: 12/10/23 2201 apixaban (ELIQUIS) tablet 5 mg   Level of Care: Level of care: Telemetry Medical Family Communication: Updated patient's family at  bedside.   Disposition Plan:     pending biopsy results.   Procedures:  EGD / EUS A mass was identified in the pancreatic head. , repeat sampling was performed.   Consultants:   Nephrology Gastroenterology.   Antimicrobials:   Anti-infectives (From admission, onward)    None        Medications  Scheduled Meds:  amiodarone  200 mg Oral BID   apixaban  5 mg Oral BID   Chlorhexidine Gluconate Cloth  6 each Topical Q0600   docusate sodium  100 mg Oral BID   fluticasone furoate-vilanterol  1 puff Inhalation Daily   hydrOXYzine  25 mg Oral TID   lanthanum  2,000 mg Oral TID WC   And   lanthanum  1,000 mg Oral BID BM   metoprolol succinate  100 mg Oral Daily   mexiletine  300 mg Oral BID   midodrine  10 mg Oral TID WC   pantoprazole  40 mg Oral BID    polyethylene glycol  17 g Oral BID   sodium chloride flush  3 mL Intravenous Q12H   Continuous Infusions:   PRN Meds:.acetaminophen **OR** acetaminophen, bisacodyl, calcium carbonate, levalbuterol, nitroGLYCERIN, ondansetron **OR** ondansetron (ZOFRAN) IV, mouth rinse, senna, simethicone, sodium chloride flush    Subjective:   Victor Thompson was seen and examined today.    Continues to have intermittent abdominal pain.   Objective:   Vitals:   12/25/23 0743 12/25/23 1018 12/25/23 1049 12/25/23 1544  BP: (!) 92/56 (!) 95/54 101/61 111/63  Pulse: 78  82 83  Resp: 18  17 18   Temp: 98.6 F (37 C)   98 F (36.7 C)  TempSrc:      SpO2: 94%   100%  Weight:      Height:        Intake/Output Summary (Last 24 hours) at 12/25/2023 1742 Last data filed at 12/25/2023 1700 Gross per 24 hour  Intake 600 ml  Output 400 ml  Net 200 ml   Filed Weights   12/24/23 1527 12/24/23 1955 12/25/23 0429  Weight: 100.8 kg 100.4 kg 104.1 kg     Exam General exam: Appears calm and comfortable  Respiratory system: Clear to auscultation. Respiratory effort normal. Cardiovascular system: S1 & S2 heard, RRR.  Gastrointestinal system: Abdomen is soft mild tenderness int he epigastric area. . Central nervous system: Alert and oriented.  Extremities: Symmetric 5 x 5 power. Skin: No rashes,  Psychiatry: Mood & affect appropriate.      Data Reviewed:  I have personally reviewed following labs and imaging studies   CBC Lab Results  Component Value Date   WBC 11.1 (H) 12/25/2023   RBC 3.59 (L) 12/25/2023   HGB 10.4 (L) 12/25/2023   HCT 29.9 (L) 12/25/2023   MCV 83.3 12/25/2023   MCH 29.0 12/25/2023   PLT 300 12/25/2023   MCHC 34.8 12/25/2023   RDW 17.3 (H) 12/25/2023   LYMPHSABS 0.6 (L) 09/29/2022   MONOABS 0.8 09/29/2022   EOSABS 0.0 09/29/2022   BASOSABS 0.0 09/29/2022     Last metabolic panel Lab Results  Component Value Date   NA 132 (L) 12/24/2023   K 4.1 12/24/2023   CL 95  (L) 12/24/2023   CO2 23 12/24/2023   BUN 43 (H) 12/24/2023   CREATININE 7.80 (H) 12/24/2023   GLUCOSE 102 (H) 12/24/2023   GFRNONAA 7 (L) 12/24/2023   GFRAA 6 (L) 10/10/2020   CALCIUM 9.0 12/24/2023   PHOS 4.5 12/22/2023  PROT 5.7 (L) 12/24/2023   ALBUMIN 1.6 (L) 12/24/2023   LABGLOB 2.9 09/08/2021   AGRATIO 1.5 09/08/2021   BILITOT 2.5 (H) 12/24/2023   ALKPHOS 166 (H) 12/24/2023   AST 197 (H) 12/24/2023   ALT 178 (H) 12/24/2023   ANIONGAP 14 12/24/2023    CBG (last 3)  No results for input(s): "GLUCAP" in the last 72 hours.    Coagulation Profile: No results for input(s): "INR", "PROTIME" in the last 168 hours.   Radiology Studies: No results found.     Kathlen Mody M.D. [facility] 12/25/2023, 5:42 PM  Available via Epic secure chat 7am-7pm After 7 pm, please refer to night coverage provider listed on amion.

## 2023-12-25 NOTE — Anesthesia Postprocedure Evaluation (Signed)
Anesthesia Post Note  Patient: Victor Thompson  Procedure(s) Performed: ESOPHAGOGASTRODUODENOSCOPY (EGD) WITH PROPOFOL UPPER ESOPHAGEAL ENDOSCOPIC ULTRASOUND (EUS) FINE NEEDLE ASPIRATION (FNA) LINEAR     Patient location during evaluation: Endoscopy Anesthesia Type: MAC Level of consciousness: oriented, awake and alert and awake Pain management: pain level controlled Vital Signs Assessment: post-procedure vital signs reviewed and stable Respiratory status: spontaneous breathing, nonlabored ventilation, respiratory function stable and patient connected to nasal cannula oxygen Cardiovascular status: blood pressure returned to baseline and stable Postop Assessment: no headache, no backache and no apparent nausea or vomiting Anesthetic complications: no   No notable events documented.  Last Vitals:    Last Pain:                 Collene Schlichter

## 2023-12-25 NOTE — Progress Notes (Addendum)
PHARMACY - ANTICOAGULATION CONSULT NOTE  Pharmacy Consult for Heparin (Eliquis on hold) Indication: atrial fibrillation  Patient Measurements: Height: 5\' 7"  (170.2 cm) Weight: 104.1 kg (229 lb 8 oz) IBW/kg (Calculated) : 66.1 Heparin Dosing Weight: 89 kg  Vital Signs: Temp: 98.6 F (37 C) (01/19 0429) BP: 98/67 (01/19 0429) Pulse Rate: 79 (01/19 0429)  Labs: Recent Labs    12/22/23 1221 12/23/23 0756 12/23/23 0756 12/24/23 0706 12/24/23 0707 12/24/23 1507 12/25/23 0701  HGB  --  10.4*   < > 9.7*  --   --  10.4*  HCT  --  29.9*  --  27.8*  --   --  29.9*  PLT  --  233  --  273  --   --  300  HEPARINUNFRC  --   --   --   --  0.26* 0.38 0.35  CREATININE 9.04* 6.36*  --  7.80*  --   --   --    < > = values in this interval not displayed.   ESRD  Assessment: 63 yr old male on Eliquis 5 mg BID prior to admission for hx atrial fibrillation. Eliquis now held for EUS with FN biopsy of pancreatic head mass. Pharmacy consulted to restart IV heparin bridge at midnight 1/9 post procedure. Continuing on heparin with ongoing procedures. EUS planned for 1/17.  1/19 AM: Heparin level 0.35, therapeutic on 1500 units/hr. No issues with infusion running or signs of bleeding per RN. CBC remains stable (Hgb 10.4, PLT 300).   Goal of Therapy:  Heparin level 0.3-0.7 units/ml Monitor platelets by anticoagulation protocol: Yes   Plan:  Continue heparin drip at 1500 units/hr. Monitor heparin level, CBC, and s/sx of bleeding daily while on heparin F/u resuming PTA Eliquis  ADDENDUM 1000: Now 36-hours after GI procedure, stop heparin and resume patient's home Eliquis 5 mg PO BID today per MD.  Enos Fling, PharmD PGY-1 Acute Care Pharmacy Resident 12/25/2023 7:44 AM

## 2023-12-25 NOTE — Plan of Care (Signed)

## 2023-12-26 ENCOUNTER — Inpatient Hospital Stay (HOSPITAL_COMMUNITY): Payer: 59

## 2023-12-26 DIAGNOSIS — I959 Hypotension, unspecified: Secondary | ICD-10-CM

## 2023-12-26 DIAGNOSIS — J9601 Acute respiratory failure with hypoxia: Secondary | ICD-10-CM

## 2023-12-26 DIAGNOSIS — N179 Acute kidney failure, unspecified: Secondary | ICD-10-CM | POA: Diagnosis not present

## 2023-12-26 DIAGNOSIS — I5043 Acute on chronic combined systolic (congestive) and diastolic (congestive) heart failure: Secondary | ICD-10-CM | POA: Diagnosis not present

## 2023-12-26 DIAGNOSIS — K8689 Other specified diseases of pancreas: Secondary | ICD-10-CM | POA: Diagnosis not present

## 2023-12-26 DIAGNOSIS — R531 Weakness: Secondary | ICD-10-CM

## 2023-12-26 LAB — CBC
HCT: 32.9 % — ABNORMAL LOW (ref 39.0–52.0)
HCT: 30.6 % — ABNORMAL LOW (ref 39.0–52.0)
Hemoglobin: 11.4 g/dL — ABNORMAL LOW (ref 13.0–17.0)
Hemoglobin: 10.6 g/dL — ABNORMAL LOW (ref 13.0–17.0)
MCH: 29.1 pg (ref 26.0–34.0)
MCH: 29.4 pg (ref 26.0–34.0)
MCHC: 34.7 g/dL (ref 30.0–36.0)
MCHC: 34.6 g/dL (ref 30.0–36.0)
MCV: 83.9 fL (ref 80.0–100.0)
MCV: 85 fL (ref 80.0–100.0)
Platelets: 384 10*3/uL (ref 150–400)
Platelets: 401 10*3/uL — ABNORMAL HIGH (ref 150–400)
RBC: 3.92 MIL/uL — ABNORMAL LOW (ref 4.22–5.81)
RBC: 3.6 MIL/uL — ABNORMAL LOW (ref 4.22–5.81)
RDW: 17.9 % — ABNORMAL HIGH (ref 11.5–15.5)
RDW: 17.9 % — ABNORMAL HIGH (ref 11.5–15.5)
WBC: 13.2 10*3/uL — ABNORMAL HIGH (ref 4.0–10.5)
WBC: 15.4 10*3/uL — ABNORMAL HIGH (ref 4.0–10.5)
nRBC: 0.5 % — ABNORMAL HIGH (ref 0.0–0.2)
nRBC: 0.5 % — ABNORMAL HIGH (ref 0.0–0.2)

## 2023-12-26 LAB — BLOOD GAS, VENOUS
Acid-Base Excess: 1.7 mmol/L (ref 0.0–2.0)
Bicarbonate: 26.6 mmol/L (ref 20.0–28.0)
O2 Saturation: 71.5 %
Patient temperature: 37
pCO2, Ven: 42 mm[Hg] — ABNORMAL LOW (ref 44–60)
pH, Ven: 7.41 (ref 7.25–7.43)
pO2, Ven: 42 mm[Hg] (ref 32–45)

## 2023-12-26 LAB — COMPREHENSIVE METABOLIC PANEL
ALT: 137 U/L — ABNORMAL HIGH (ref 0–44)
AST: 135 U/L — ABNORMAL HIGH (ref 15–41)
Albumin: 1.7 g/dL — ABNORMAL LOW (ref 3.5–5.0)
Alkaline Phosphatase: 169 U/L — ABNORMAL HIGH (ref 38–126)
Anion gap: 15 (ref 5–15)
BUN: 33 mg/dL — ABNORMAL HIGH (ref 6–20)
CO2: 24 mmol/L (ref 22–32)
Calcium: 9.3 mg/dL (ref 8.9–10.3)
Chloride: 93 mmol/L — ABNORMAL LOW (ref 98–111)
Creatinine, Ser: 7.28 mg/dL — ABNORMAL HIGH (ref 0.61–1.24)
GFR, Estimated: 8 mL/min — ABNORMAL LOW (ref >60)
Glucose, Bld: 89 mg/dL (ref 70–99)
Potassium: 4.5 mmol/L (ref 3.5–5.1)
Sodium: 132 mmol/L — ABNORMAL LOW (ref 135–145)
Total Bilirubin: 3.3 mg/dL — ABNORMAL HIGH (ref 0.0–1.2)
Total Protein: 6.4 g/dL — ABNORMAL LOW (ref 6.5–8.1)

## 2023-12-26 LAB — CYTOLOGY - NON PAP

## 2023-12-26 LAB — LACTIC ACID, PLASMA: Lactic Acid, Venous: 1.4 mmol/L (ref 0.5–1.9)

## 2023-12-26 LAB — GLUCOSE, CAPILLARY: Glucose-Capillary: 79 mg/dL (ref 70–99)

## 2023-12-26 LAB — BRAIN NATRIURETIC PEPTIDE: B Natriuretic Peptide: 450 pg/mL — ABNORMAL HIGH (ref 0.0–100.0)

## 2023-12-26 LAB — CHROMOGRANIN A: Chromogranin A (ng/mL): 1023 ng/mL — ABNORMAL HIGH (ref 0.0–101.8)

## 2023-12-26 MED ORDER — MIDODRINE HCL 5 MG PO TABS
15.0000 mg | ORAL_TABLET | Freq: Three times a day (TID) | ORAL | Status: DC
  Administered 2023-12-26 – 2023-12-27 (×2): 15 mg via ORAL
  Filled 2023-12-26 (×3): qty 3

## 2023-12-26 MED ORDER — LANTHANUM CARBONATE 500 MG PO CHEW
2000.0000 mg | CHEWABLE_TABLET | Freq: Three times a day (TID) | ORAL | Status: DC
  Filled 2023-12-26 (×5): qty 4

## 2023-12-26 MED ORDER — ALBUMIN HUMAN 5 % IV SOLN
25.0000 g | Freq: Once | INTRAVENOUS | Status: DC
  Filled 2023-12-26: qty 500

## 2023-12-26 MED ORDER — SODIUM CHLORIDE 0.9 % IV BOLUS
500.0000 mL | Freq: Once | INTRAVENOUS | Status: AC | PRN
  Administered 2023-12-26: 500 mL via INTRAVENOUS

## 2023-12-26 MED ORDER — LANTHANUM CARBONATE 500 MG PO CHEW: 1000.0000 mg | CHEWABLE_TABLET | Freq: Two times a day (BID) | ORAL | Status: DC | PRN

## 2023-12-26 NOTE — Plan of Care (Signed)

## 2023-12-26 NOTE — Progress Notes (Signed)
PROGRESS NOTE    Victor Thompson  ZOX:[id number] DOB: 1961/10/16 DOA: 12/10/2023 PCP: [name], MD  Brief Narrative:  This 63 year old African-American male, with past medical history significant for ESRD on HD TTS schedule, ischemic cardiomyopathy, congestive heart failure with reduced EF (25 to 30%), history of CAD, VT storm status post ICD, currently on Eliquis and amiodarone, reactive airway disease and hyperlipidemia presented with epigastric pain, shortness of breath, cough and chest pain.  Further workup in the ED showed pancreatic mass concerning for malignancy.  Patient was transferred to [facility] as patient will likely need endoscopic ultrasound guided biopsy of pancreatic mass.  GI is following him.   12/14/2023:  EUS guided biopsy of the pancreatic mass planned for today.  On heparin drip.  Nephrology on board for Hemodialysis needs.  1/17: He underwent repeat EUS guided biopsy of pancreatic mass, results are pending.  GI  signed off.   Assessment & Plan:   Principal Problem:   Pancreatic mass Active Problems:   Combined congestive systolic and diastolic heart failure (HCC)   Hypotension   Hypokalemia   Elevated troponin   Chest pain   Reactive airway disease   ESRD (end stage renal disease) (HCC)   History of ventricular tachycardia s/p ICD   Abdominal pain   Gastritis and gastroduodenitis   Idiopathic acute pancreatitis without infection or necrosis   Pancreatic lesion   Abnormal CT of the abdomen   History of pancreatitis  Pancreatic mass: S/p EUS on 1/8 and 1/17. Biopsies done and results pending.  Previous biopsy findings consistent with chronic pancreatitis. Patient requests to wait for the results as he reports having intermittent abdominal pain.  GI  signed off.    Essential Hypertension: Blood pressure remains on the soft side. Currently on midodrine 10 mg TID.   Chronic combined systolic and diastolic CHF No evidence of CHF  exacerbation.  Fluid management with HD.    H/o ventricular tachycardia as well as ICD Continue with amiodarone 200 mg BID. Metoprolol 100 mg daily and mexiletine 300 mg daily on hold due to hypotension. . Continue with Eliquis.    Elevated troponin: Probably secondary to demand ischemia from recurrent VT Currently denies any chest pain.    Reactive airway disease: Continue with Breo Ellipta twice daily and Xopenex as needed. Of note COVID, flu, RSV negative. CTA chest unremarkable.  Acute hypoxic respiratory failure: Overnight oxygen requirement has gone up from 2 L to 6 L now with sats 90%. Patient denies any cough or shortness of breath,.  Obtain chest x-ray Continue supplemental oxygen and wean as tolerated   End-stage renal disease on dialysis with TTS schedule Nephrology on board.  Continue hemodialysis as per schedule   Obesity: Diet, exercise and weight reduction advised. Continue CPAP at night for OSA. Body mass index is 35.94 kg/m.   Constipation: Resolved.    Post ERCP pancreatitis: Slowly improving.  Liver enzymes are improved.  CT abd and pelvis on 1/13 showing pancreatitis.    Toxic metabolic encephalopathy: > Resolved. Unclear etiology, but suspect may be related to hypotension  CT head negative for acute pathology.  Mild leukocytosis.  Ammonia was normal   Anemia of chronic disease: Hemoglobin stable around 10.    Hyponatremia from ESRD.     Estimated body mass index is 35.94 kg/m as calculated from the following:   Height as of this encounter: 5\' 7"  (1.702 m).   Weight as of this encounter: 104.1 kg.   DVT  prophylaxis: SCDs, Eliquis Code Status: Full code Family Communication: Mother at bedside Disposition Plan:   Status is: Inpatient Remains inpatient appropriate because: Pending biopsy results.   Consultants:  Nephrology Gastroenterology  Procedures: EGD / EUS  mass was identified in the pancreatic head. , repeat sampling was  performed.     Antimicrobials:  Anti-infectives (From admission, onward)    None      Subjective: Patient was seen and examined at bedside.  Overnight events noted. Overnight patient oxygen requirement has gone up to 6 L now saturating 90%. Patient denies any cough or shortness of breath.  Objective: Vitals:   12/26/23 0047 12/26/23 0647 12/26/23 0700 12/26/23 0930  BP:  92/65 97/66 97/66   Pulse: 92 82  82  Resp: 16 18    Temp:  98.5 F (36.9 C)    TempSrc:  Oral    SpO2: 100% 100% 92%   Weight:  100.2 kg    Height:        Intake/Output Summary (Last 24 hours) at 12/26/2023 1052 Last data filed at 12/25/2023 1700 Gross per 24 hour  Intake 480 ml  Output --  Net 480 ml   Filed Weights   12/24/23 1955 12/25/23 0429 12/26/23 0647  Weight: 100.4 kg 104.1 kg 100.2 kg    Examination:  General exam: Appears calm and comfortable, deconditioned, not in any acute distress. Respiratory system: Decreased breath sounds. Respiratory effort normal.  RR 15 Cardiovascular system: S1 & S2 heard, RRR. No JVD, murmurs, rubs, gallops or clicks. Gastrointestinal system: Abdomen is non distended, soft and non tender.  Normal bowel sounds heard. Central nervous system: Alert and oriented x 3. No focal neurological deficits. Extremities: No edema, no cyanosis, no clubbing Skin: No rashes, lesions or ulcers Psychiatry: Judgement and insight appear normal. Mood & affect appropriate.     Data Reviewed: I have personally reviewed following labs and imaging studies  CBC: Recent Labs  Lab 12/21/23 2343 12/23/23 0756 12/24/23 0706 12/25/23 0701 12/26/23 0858  WBC 7.3 7.1 7.9 11.1* 13.2*  HGB 10.7* 10.4* 9.7* 10.4* 11.4*  HCT 30.6* 29.9* 27.8* 29.9* 32.9*  MCV 84.3 83.8 84.2 83.3 83.9  PLT 232 233 273 300 384   Basic Metabolic Panel: Recent Labs  Lab 12/21/23 2343 12/22/23 1221 12/23/23 0756 12/24/23 0706 12/26/23 0858  NA 131* 132* 133* 132* 132*  K 4.4 4.5 4.3 4.1 4.5   CL 95* 94* 95* 95* 93*  CO2 24 23 24 23 24   GLUCOSE 86 79 81 102* 89  BUN 41* 49* 31* 43* 33*  CREATININE 7.78* 9.04* 6.36* 7.80* 7.28*  CALCIUM 9.0 8.9 8.5* 9.0 9.3  MG 2.1  --   --   --   --   PHOS 4.4 4.5  --   --   --    GFR: Estimated Creatinine Clearance: 12.2 mL/min (A) (by C-G formula based on SCr of 7.28 mg/dL (H)). Liver Function Tests: Recent Labs  Lab 12/20/23 1404 12/21/23 0640 12/22/23 1221 12/24/23 0706 12/26/23 0858  AST 305* 354*  --  197* 135*  ALT 237* 251*  --  178* 137*  ALKPHOS 189* 191*  --  166* 169*  BILITOT 1.8* 2.5*  --  2.5* 3.3*  PROT 5.7* 6.1*  --  5.7* 6.4*  ALBUMIN 1.7* 1.8* 1.6* 1.6* 1.7*   No results for input(s): "LIPASE", "AMYLASE" in the last 168 hours. Recent Labs  Lab 12/19/23 1217  AMMONIA 30   Coagulation Profile: No results for input(s): "  INR", "PROTIME" in the last 168 hours. Cardiac Enzymes: No results for input(s): "CKTOTAL", "CKMB", "CKMBINDEX", "TROPONINI" in the last 168 hours. BNP (last 3 results) No results for input(s): "PROBNP" in the last 8760 hours. HbA1C: No results for input(s): "HGBA1C" in the last 72 hours. CBG: No results for input(s): "GLUCAP" in the last 168 hours. Lipid Profile: No results for input(s): "CHOL", "HDL", "LDLCALC", "TRIG", "CHOLHDL", "LDLDIRECT" in the last 72 hours. Thyroid Function Tests: No results for input(s): "TSH", "T4TOTAL", "FREET4", "T3FREE", "THYROIDAB" in the last 72 hours. Anemia Panel: No results for input(s): "VITAMINB12", "FOLATE", "FERRITIN", "TIBC", "IRON", "RETICCTPCT" in the last 72 hours. Sepsis Labs: No results for input(s): "PROCALCITON", "LATICACIDVEN" in the last 168 hours.  Recent Results (from the past 240 hours)  Culture, blood (Routine X 2) w Reflex to ID Panel     Status: None   Collection Time: 12/20/23 10:38 PM   Specimen: BLOOD  Result Value Ref Range Status   Specimen Description BLOOD SITE NOT SPECIFIED  Final   Special Requests   Final    BOTTLES  DRAWN AEROBIC AND ANAEROBIC Blood Culture results may not be optimal due to an inadequate volume of blood received in culture bottles   Culture   Final    NO GROWTH 5 DAYS Performed at [facility], [address] [place], [place] [address]    Report Status 12/25/2023 FINAL  Final  Culture, blood (Routine X 2) w Reflex to ID Panel     Status: None   Collection Time: 12/20/23 10:40 PM   Specimen: BLOOD  Result Value Ref Range Status   Specimen Description BLOOD SITE NOT SPECIFIED  Final   Special Requests   Final    BOTTLES DRAWN AEROBIC AND ANAEROBIC Blood Culture results may not be optimal due to an inadequate volume of blood received in culture bottles   Culture   Final    NO GROWTH 5 DAYS Performed at [facility], [address] [place], [place] [address]    Report Status 12/25/2023 FINAL  Final    Radiology Studies: No results found.  Scheduled Meds:  amiodarone  200 mg Oral BID   apixaban  5 mg Oral BID   Chlorhexidine Gluconate Cloth  6 each Topical Q0600   docusate sodium  100 mg Oral BID   fluticasone furoate-vilanterol  1 puff Inhalation Daily   hydrOXYzine  25 mg Oral TID   lanthanum  2,000 mg Oral TID WC   And   lanthanum  1,000 mg Oral BID BM   metoprolol succinate  100 mg Oral Daily   mexiletine  300 mg Oral BID   midodrine  10 mg Oral TID WC   pantoprazole  40 mg Oral BID   polyethylene glycol  17 g Oral BID   sodium chloride flush  3 mL Intravenous Q12H   Continuous Infusions:   LOS: 16 days    Time spent: 35 mins    Willeen Niece, MD [facility]   If 7PM-7AM, please contact night-coverage

## 2023-12-26 NOTE — Significant Event (Addendum)
Rapid Response Event Note   Reason for Call :  Possible seizure like activity  Initial Focused Assessment:  Patient lying in bed with eyes closed, responsive to voice able to answer questions. Per family, pt had just transferred form chair to bed and "shaking all over and eyes rolled back in head upward." Upon arrival patient in trendelenberg position due to SBP low 80s. Skin warm and dry. Lungs with rhonchi on left/ diminished. Patient without complaints.   85/53 (63) HR 79 RR 20 O2 96% [place] 6L  Interventions/Plan of Care:  Trendelenberg EKG NS bolus  MD at bedside RT to bedside d/t desaturation low 80s: placed on bipap mode with dreamstation Transfer to PCU  98/81 (88)  Event Summary:  MD Notified: Lindi Adie MD Call Time: 1914 Arrival Time: 1242 End Time:   Addendum 1445 Bed clean and ready 4E. Bolus finished, BP down trending. MD notified by primary RN, PCCM consulted prior to transfer. Patient transferred to 4E11. Albumin orders placed, though per CCM MD hold d/t fluid status unless MAP <60.   Truddie Crumble, RN

## 2023-12-26 NOTE — Progress Notes (Signed)
Patient ID: Victor Thompson, male   DOB: 1961/10/27, 63 y.o.   MRN: [id number]   Brief GI note  EUS with fine-needle biopsy of pancreatic head mass on 1/17 /2025- Cytology has returned negative for malignancy, acute and chronic inflammation and necrosis present.   Dr. Meridee Score suggest patient have repeat CT abdomen with contrast in 3 to 4 weeks and if masslike area still present then will need referral to [facility] ( [facility]) for repeat EUS.   Patient just had a rapid response event and is being transferred to ICU  GI will follow up tomorrow, and discussed with the patient/family

## 2023-12-26 NOTE — Progress Notes (Signed)
Pt able to tolerate being off BIPAP on NRB mask to take PO meds. No distress. Oxygen Saturations 100% on NRB mask at 15L. Pt weaned to [place] HFNC at 6L with sats 97%.

## 2023-12-26 NOTE — Progress Notes (Signed)
MD at the bedside, assessing pt

## 2023-12-26 NOTE — Progress Notes (Signed)
Patient ID: NAMIR ALLEE, male   DOB: [birth year], 63 y.o.   MRN: [id number]    Progress Note from the Palliative Medicine Team at [facility]   [name]        Date: 12/26/2023 DOB: 1961-07-20  Age: 63 y.o. MRN#: [id number] Attending Physician: Willeen Niece, MD Primary Care Physician: [name], MD Admit Date: 12/10/2023   Reason for Consultation/Follow-up   Establishing Goals of Care   HPI/ Brief Hospital Review  63 y.o. male   admitted on 12/10/2023 with past  medical history significant of ESRD on HD TTS schedule, ischemic cardiomyopathy, congestive heart reduced EF 25 to 30%, history of CAD, VT storm status post ICD currently on Eliquis and amiodarone, reactive airway disease and hyperlipidemia but emergency department complaining of epigastric pain, shortness of breath, cough and chest pain.     Further workup in the ED showed pancreatic mass concerning for malignancy.   Biopsy 12/14/2023 does not appear to be malignant, consistent with chronic pancreatitis   Hospitalization complicated by altered mental status.  Today is day 16 of this hospitalization   Patient and his family face treatment option decisions, advanced directive decisions and anticipatory care needs.   Subjective  Extensive chart review has been completed prior to meeting with patient/family  including labs, vital signs, imaging, progress/consult notes, orders, medications and available advance directive documents.    This NP assessed patient at the bedside as a follow up for palliative medicine needs and emotional support  Patient is more alert and oriented, mother at bedside and fianc is on the telephone.   Ongoing education regarding patient's multiple co-morbidities specific to ESRD/HD dependent, CHF, CAD, Immobility, severe protein calorie malnutrition  (albumin 1.8) immobility.  Patient is aggravated and short with his mother and fianc who is on the phone.  He is telling  them that he wants to "do things his way"  Ongoing conversation/education to patient regarding his anticipation and responsibility in his own treatment plan and overall state of wellness.  He continues to fail to thrive, he is high risk for further decompensation.  Patient and his family understand this.  Plan of care -Full code       -Educated patient/family to consider DNR/DNI status understanding evidenced based poor outcomes in similar hospitalized patient, as the cause of arrest is likely associated with advanced chronic illness rather than an easily reversible acute cardio-pulmonary event.  -Continue all offered and available medical interventions to prolong life -SNF for rehab on discharge when medically stable -Recommend outpatient palliative services  Education offered today regarding  the importance of continued conversation with family and their  medical providers regarding overall plan of care and treatment options,  ensuring decisions are within the context of the patients values and GOCs.  Questions and concerns addressed   Discussed with primary team and nursing staff  PMT will continue to support holistically  Family encouraged to call PMT with  questions or concerns, contact info given.   Time: 50   minutes  Detailed review of medical records ( labs, imaging, vital signs), medically appropriate exam ( MS, skin, cardiac,  resp)   discussed with treatment team, counseling and education to patient, family, staff, documenting clinical information, medication management, coordination of care    Lorinda Creed NP  Palliative Medicine Team Team Phone # [phone] Pager [phone]

## 2023-12-26 NOTE — Progress Notes (Signed)
BP

## 2023-12-26 NOTE — Progress Notes (Signed)
RT note. Patient placed on bipap at this time per order. RT will continue to monitor.    12/26/23 1548  BiPAP/CPAP/SIPAP  $ Non-Invasive Ventilator  Non-Invasive Vent Initial  $ Face Mask Medium Yes  BiPAP/CPAP/SIPAP Pt Type Adult  BiPAP/CPAP/SIPAP V60  Mask Type Full face mask  Mask Size Medium  Set Rate 16 breaths/min  Respiratory Rate 28 breaths/min  IPAP 16 cmH20  EPAP 8 cmH2O  FiO2 (%) 40 %  Minute Ventilation 16  Leak 0  Peak Inspiratory Pressure (PIP) 13  Tidal Volume (Vt) 525  Patient Home Equipment No  Auto Titrate No  Press High Alarm 25 cmH2O  BiPAP/CPAP /SiPAP Vitals  Pulse Rate 76  Resp (!) 28  SpO2 98 %  MEWS Score/Color  MEWS Score 3  MEWS Score Color Yellow

## 2023-12-26 NOTE — Progress Notes (Signed)
PCCM note: I was called by the hospitalist for low BP 60/40 He was not given Midodrine after am dose today because being on BiPAP and could not take him off the mask for meds. Rapid response team put him on HC 6 L [place], SpO2 97% Given Amiodarone and Midodrine 30 min ago, BP 81/47  PE: General: A&O x4 Neuro: awake following commands Chest: Bilateral basal crackles, no wheezes Heart: sinus, no murmurs or gallops Abdomen: soft, nontender, nondistended LL: trace edema   Labs and images were reviewed   Assessment: Acute hypoxic respiratory failure due to pulmonary edema Acute on chronic HFrEF, EF 30%, G1DD AICD VT OSA ESRD on HD Chronic hypotension Hyponatremia Acute on chronic pancreatitis Obesity Shock liver due to congestive hepatopathy Anemia of chronic disease  Plan: CPAP when sleeping [place], keeping SpO2 >90% HD in am I/O chart Avoid fluid boluses Monitor MAP Hold antihypertensive meds Continue Midodrine and Amiodarone   CCM 35 min  D/w RN

## 2023-12-26 NOTE — Progress Notes (Signed)
Unable to give schedule medicine to patient as patient remains in BIPAP and was not able to tolerate in high flow nasal cannula or non breather mask ,MD notified

## 2023-12-26 NOTE — Progress Notes (Signed)
RT note. RT attempted x3 with RN and lab at bedside with doppler in use both radial and brachial.  Lab able to get blood, VBG sent down to lab. Lab notified. RT will continue to monitor.

## 2023-12-26 NOTE — Progress Notes (Signed)
[facility] Progress Note   Subjective: Seen in room -mother at bedside Per nursing, some confusion last night.  Alert, on 6L [place] this am. Denies sob, cp  Objective Vitals:   12/25/23 2029 12/26/23 0047 12/26/23 0647 12/26/23 0700  BP: 106/65  92/65 97/66  Pulse: 90 92 82   Resp: 18 16 18    Temp: 99.3 F (37.4 C)  98.5 F (36.9 C)   TempSrc: Oral  Oral   SpO2: 100% 100% 100% 92%  Weight:   100.2 kg   Height:          Additional Objective Labs: Basic Metabolic Panel: Recent Labs  Lab 12/21/23 2343 12/22/23 1221 12/23/23 0756 12/24/23 0706  NA 131* 132* 133* 132*  K 4.4 4.5 4.3 4.1  CL 95* 94* 95* 95*  CO2 24 23 24 23   GLUCOSE 86 79 81 102*  BUN 41* 49* 31* 43*  CREATININE 7.78* 9.04* 6.36* 7.80*  CALCIUM 9.0 8.9 8.5* 9.0  PHOS 4.4 4.5  --   --    CBC: Recent Labs  Lab 12/20/23 1404 12/21/23 2343 12/23/23 0756 12/24/23 0706 12/25/23 0701  WBC 9.5 7.3 7.1 7.9 11.1*  HGB 10.7* 10.7* 10.4* 9.7* 10.4*  HCT 30.2* 30.6* 29.9* 27.8* 29.9*  MCV 84.1 84.3 83.8 84.2 83.3  PLT 228 232 233 273 300   Blood Culture    Component Value Date/Time   SDES BLOOD SITE NOT SPECIFIED 12/20/2023 2240   SPECREQUEST  12/20/2023 2240    BOTTLES DRAWN AEROBIC AND ANAEROBIC Blood Culture results may not be optimal due to an inadequate volume of blood received in culture bottles   CULT  12/20/2023 2240    NO GROWTH 5 DAYS Performed at [facility], [address], [place], [place] [address]    REPTSTATUS 12/25/2023 FINAL 12/20/2023 2240     Physical Exam General: Alert, lying in bed, nad Heart: RRR No rub Lungs: Clear, coarse breath sounds Abdomen: soft non-tender  Extremities: No LE edema  Dialysis Access: R AVF +bruit   Medications:   amiodarone  200 mg Oral BID   apixaban  5 mg Oral BID   Chlorhexidine Gluconate Cloth  6 each Topical Q0600   docusate sodium  100 mg Oral BID   fluticasone furoate-vilanterol  1 puff Inhalation Daily    hydrOXYzine  25 mg Oral TID   lanthanum  2,000 mg Oral TID WC   And   lanthanum  1,000 mg Oral BID BM   metoprolol succinate  100 mg Oral Daily   mexiletine  300 mg Oral BID   midodrine  10 mg Oral TID WC   pantoprazole  40 mg Oral BID   polyethylene glycol  17 g Oral BID   sodium chloride flush  3 mL Intravenous Q12H    Dialysis Orders: [place] [facility]   4h   450 /1.5  102kg  2/2 bath   AVF   Heparin 3000 - hect 9 micrograms three times per week  Assessment/Plan: Pancreatic mass - w/ abd pain, CT +for mass a head of pancreas, also multiple local enlarged LN's. SP EUS on 1/08 w/ biopsy appearing not malignant and more c/w with chronic pancreatitis. Rpt EGD 1/17 with biopsy - path pending;  High concern for malignancy ESKD - on HD TTS. Next HD 1/21 UF 1L max HTN - BP's soft. DC'd bidil for now. On MTP and prn midodrine; per cardiology 2/2 #8;  Volume - CXR negative. Under outpt EDW,  adjust down at DC.  Gentle UF moving forward Anemia of eskd - Hb 10-11, not on esa at OP unit. Follow.  Secondary hyperparathyroidism - CCa in range. Cont binders w/ meals.  H/o combined heart failure H/o VT storm/ ischemic CM/ PAF - sp ICD and takes mexilitene, BB and amiodarone. Cardiology following - not stable for cardiac PET Debility / Decline: Overall poor prognosis, palliative following, FC   Jadelyn Elks Susann Givens PA-C [facility] 12/26/2023,9:10 AM

## 2023-12-26 NOTE — Progress Notes (Signed)
Patient arrived at the unit,pt is in BIPAP,pt alert and oriented x4,CCMD notified ,vitals taken his systolic bp is in 80s, rapid response and critical care MD at bedside,MD advice not to give albumin as it might lead him to fluid overload,advice to keep MAP above 60 and systolic BP above 80,will monitor him closely

## 2023-12-26 NOTE — Progress Notes (Signed)
RT called to bedside for desaturation. Pt placed on cpap and mode was later changed to bipap. Pressures titrated on pt comfort, ending on 16/8 with 8L oxygen bled in. SpO2 increased to 95%. Pt in no acute distress during entire stay of RT. RT will continue to monitor and be available as needed.

## 2023-12-26 NOTE — Progress Notes (Addendum)
BP 66/34, MAP 43. Loney Loh, MD paged. Mental status unchanged from earlier. Pt alert, but somewhat lethargic. Pt skin dry and warm to touch.  Loney Loh, MD will re consult critical care. MD made aware of multiple Vtach runs and short episode of Afib, pt back to NSR. OK to give Amiodarone.  This RN able to place pt on HFNC and give Amiodarone, Eliquis and Midodrine(scheduled earlier). Pt takes meds w/o difficulties, maintaining SpO2 96-99% on 6L/min HFNC. No respiratory distress at this time. Family at the bedside updated. Rapid at the bedside

## 2023-12-26 NOTE — Progress Notes (Signed)
Physical Therapy Treatment Patient Details Name: Victor Thompson MRN: [id number] DOB: 1961/07/07 Today's Date: 12/26/2023   History of Present Illness Pt is a 63 y/o M admitted on 12/10/23 after presenting with c/o epigastric pain, SOB, cough & chest pain. Workup showed pancreatic mass concerning for malignancy. S/p Upper EUS 1/17. PMH: ESRD on HD TTS, ischemic cardiomyopathy, CHF with reduced EF, CAD, VT storm s/p ICD, reactive airway disease, HLD.    PT Comments  Tolerated transfer training today, utilized Stedy which provided increased support and control; Mod assist, (close to min assist but needing additional cues for sequencing/facilitation as he is lethargic.)  Able to stand with this device and did not require bil knee block from knee pad, showing some improved strength and control compared to Friday. He is certainly more lethargic and delayed. Follows 75% simple of commands appropriately. Reviewed LE exercises. See vitals below.  Pre-activity supine in bed: BP 89/60, HR 77, SPO2 88% on 6L supplemental O2; Sitting EOB BP 108/70, HR 78, SpO2 96% on 6L; After transfer to chair, sitting upright with LEs elevated, BP 93/64 HR 81, SpO2 91% on 6L. Patient had compression stockings on with all testing and during mobility today - he felt dizzy while standing.   If plan is discharge home, recommend the following: A lot of help with bathing/dressing/bathroom;Assistance with cooking/housework;Direct supervision/assist for medications management;Assist for transportation;Help with stairs or ramp for entrance;Supervision due to cognitive status;Two people to help with walking and/or transfers   Can travel by private vehicle     No  Equipment Recommendations  Rolling walker (2 wheels)    Recommendations for Other Services Rehab consult     Precautions / Restrictions Precautions Precautions: Fall Precaution Comments: watch BP Restrictions Weight Bearing Restrictions Per Provider Order: No      Mobility  Bed Mobility Overal bed mobility: Needs Assistance Bed Mobility: Supine to Sit     Supine to sit: Mod assist, HOB elevated, Used rails     General bed mobility comments: Mod assist for trunk support, tactile and verbal cues for sequencing and to facilitate. Lethargic.    Transfers Overall transfer level: Needs assistance Equipment used: Ambulation equipment used Transfers: Sit to/from Stand Sit to Stand: Mod assist           General transfer comment: Mod assist for boost to stand with tactile cues to facilitate forward weight shift and hip extension for upright stance. Stood once from bed, and once from perched position on [place]. Tolerated with improved balance and duration <30 seconds today compared to last visit with hand held support. Transfer via Lift Equipment: Stedy  Ambulation/Gait                   Stairs             Wheelchair Mobility     Tilt Bed    Modified Rankin (Stroke Patients Only)       Balance Overall balance assessment: Needs assistance Sitting-balance support: Feet supported, No upper extremity supported Sitting balance-Leahy Scale: Fair     Standing balance support: During functional activity, Bilateral upper extremity supported Standing balance-Leahy Scale: Poor Standing balance comment: BIL UEs in Stedy; knees did not need blocked.                            Cognition Arousal: Lethargic Behavior During Therapy: Flat affect Overall Cognitive Status: Impaired/Different from baseline  General Comments: He is oriented to place, month, year, and self. Follows simple commands 75% of the time but needs cues to facilitate, a little delayed, lethargic.        Exercises General Exercises - Lower Extremity Ankle Circles/Pumps: AROM, Both, 15 reps, Supine Quad Sets: Strengthening, Both, 10 reps, Supine Gluteal Sets: Strengthening, Both, 10 reps, Supine     General Comments General comments (skin integrity, edema, etc.): Pre activity supine in bed: BP 89/60, HR 77, SPO2 88% on 6L supplemental O2; Sitting EOB BP 108/70, HR 78, SpO2 96% on 6L; After transfer to chair, sitting upright with LEs elevated, BP 93/64 HR 81, SpO2 91% on 6L. Patient had compression stockings on with all testing and during mobility today - he felt dizzy while standing.      Pertinent Vitals/Pain Pain Assessment Pain Assessment: No/denies pain    Home Living                          Prior Function            PT Goals (current goals can now be found in the care plan section) Acute Rehab PT Goals Patient Stated Goal: not stated PT Goal Formulation: With patient/family Time For Goal Achievement: 12/30/23 Potential to Achieve Goals: Good Progress towards PT goals: Progressing toward goals    Frequency    Min 1X/week      PT Plan      Co-evaluation              AM-PAC PT "6 Clicks" Mobility   Outcome Measure  Help needed turning from your back to your side while in a flat bed without using bedrails?: A Lot Help needed moving from lying on your back to sitting on the side of a flat bed without using bedrails?: A Lot Help needed moving to and from a bed to a chair (including a wheelchair)?: A Lot Help needed standing up from a chair using your arms (e.g., wheelchair or bedside chair)?: A Lot Help needed to walk in hospital room?: Total Help needed climbing 3-5 steps with a railing? : Total 6 Click Score: 10    End of Session Equipment Utilized During Treatment: Gait belt;Oxygen Activity Tolerance: Patient tolerated treatment well;Patient limited by lethargy Patient left: with call bell/phone within reach;with family/visitor present;in chair;with chair alarm set Nurse Communication: Mobility status;Need for lift equipment (vitals during therapy; Recommended stedy during transfer.) PT Visit Diagnosis: Muscle weakness (generalized)  (M62.81);Other abnormalities of gait and mobility (R26.89);Unsteadiness on feet (R26.81);Difficulty in walking, not elsewhere classified (R26.2);Pain Pain - Right/Left: Right Pain - part of body:  (leg)     Time: 0981-1914 PT Time Calculation (min) (ACUTE ONLY): 35 min  Charges:    $Therapeutic Activity: 23-37 mins PT General Charges $$ ACUTE PT VISIT: 1 Visit                     Kathlyn Sacramento, PT, DPT [facility] Services Physical Therapist Office: [phone] Website: [email or web address]    Berton Mount 12/26/2023, 11:40 AM

## 2023-12-26 NOTE — Progress Notes (Signed)
[facility]ST consult received to obtain labwork. SecureChat sent to unit nurse and charge nurse, as well as Idelle Leech, MD;  "I understand phlebotomy has not been able to obtain lab work from this patient. VAST/IV team does not stick patients for labwork. An order can be placed for respiratory therapy to complete an arterial stick for labwork. The other option is to have labs drawn at beginning of dialysis treatments. Sorry we are unable to assist you."

## 2023-12-26 NOTE — Consult Note (Signed)
NAME:  Victor Thompson, MRN:  [id number], DOB:  [birth year], LOS: 16 ADMISSION DATE:  12/10/2023, CONSULTATION DATE: 1/20  REFERRING MD:  MD Idelle Leech CHIEF COMPLAINT:    History of Present Illness:  63 yr old, African American male with a significant pmhx for ischemic cardiomyopthy, HfrEF, CAD, VT storm s/p ICD on eliquis, ESRD on HD ( TTS), Asthma, Sleep Apnea and hyperlipidemia who presented to [facility] ED with SOB, chest pain, and epigastric pain. Initial ED work up revealed pancreatic /small bowel mass concerning for metastatic disease on CT abdomen and pelvis. GI originally consulted and patient was transferred to [facility] for consultation of Endoscopic ultrasounded guided biopsy, which occurred on 1/8 and on 1/17. Biopsy was obtained and pathology still pending. Upon transferring from chair to bed, patient had near syncopal event and was found to be hypotensive. Rapid Response called to beside and TRH consulted PCCM to evaluate hypotension.   Pertinent  Medical History   Past Medical History:  Diagnosis Date   Acute on chronic systolic CHF (congestive heart failure) (HCC) 06/14/2020   Acute respiratory failure (HCC) 05/28/2022   AICD (automatic cardioverter/defibrillator) present    2015   Allergy    Asthma    no meds   Cardiac arrest [facility]) 2015   Chest pain 05/25/2022   CHF (congestive heart failure) (HCC)    Coronary artery disease    ESRD on hemodialysis (HCC)    ckd -stage 5   Hyperlipidemia    Hypertension    Myocardial infarction (HCC)    Obesity    Pneumonia    Shortness of breath dyspnea    Sleep apnea    USES CPAP   Wears glasses      Significant Hospital Events: Including procedures, antibiotic start and stop dates in addition to other pertinent events   1/4 Admitted with chest pain, SOB, and epigastric pain- found to have pancreatic mass 1/8 EUS with biopsy- indicated chronic pancreatitis and negative for malignancy  1/17 EUS with biopsy repeat to evaluate  pancreatic mass, pending biopsy  1/20 PCCM consult, hypotension  Interim History / Subjective:  Called to evaluate due to hypotension, rapid response at beside Patient on BIPAP, following commands  Objective   Blood pressure (!) 90/54, pulse 74, temperature 98.5 F (36.9 C), temperature source Oral, resp. rate 18, height 5\' 7"  (1.702 m), weight 100.2 kg, SpO2 93%.       Intake/Output Summary (Last 24 hours) at 12/26/2023 1520 Last data filed at 12/25/2023 1700 Gross per 24 hour  Intake 120 ml  Output --  Net 120 ml   Filed Weights   12/24/23 1955 12/25/23 0429 12/26/23 0647  Weight: 100.4 kg 104.1 kg 100.2 kg    Examination: General: chronically ill appearing adult male, lying on floor bed HENT: normocephalic, poor dentition, pink MM  Lungs: clear,diminished throughout chest  Cardiovascular: s1,s2, RRR, no JVD, no MRG  Abdomen: BS active, soft, non tender  Extremities: able to move all extremities  Neuro: RASS -1, able to arouse, follows commands, GU: Anuric, intact   Resolved Hospital Problem list   N/A   Assessment & Plan:   Hypotension  Hx of HTN Hx of VT storm with ICD  Suspecting vagal response vs hypovolemia in setting of ESRD Received 500 cc NS bolus per Rapid Response  P:  Obtain EKG Obtain lactic acid, CBC and f/u  Transfer to progressive, MAP goal >65 Will reassess in 1 to 2 hrs Increase Midodrine from 10mg  to  15mg  TID  Hold metoprolol remainder of shift Okay to give Amio for now  No need for vasopressors at this time  Continue GOC with palliative care   Acute hypoxic respiratory failure  Secondary to CHF exacerb in setting of ESRD  HfrEF Asthma  Chest X-ray 1/20- concern for pulmonary vascular congestion/pulmonary edema  ECHO 1/5 EF 30-35%, reduced LV function RV function normal, mild MR  Troponin on 1/11 361  P:  Continue BIPAP now Will need HD scheduled for tomorrow 1/21  Continue with Breo Ellipta BID  Xopenex as needed  Repeat BNP    ESRD (TTS)  Hyponatremia P: Nephro following Scheduled for HD tomorrow 1/21 Monitor and trend BMETs daily Optimize electrolytes  Pancreatitis  1/13 CT abdomen and pelvis indicating pancreatitis  LFTS trending down P: Gi following Continue to trend LFTs intermittently   Best Practice per primary     Labs   CBC: Recent Labs  Lab 12/21/23 2343 12/23/23 0756 12/24/23 0706 12/25/23 0701 12/26/23 0858  WBC 7.3 7.1 7.9 11.1* 13.2*  HGB 10.7* 10.4* 9.7* 10.4* 11.4*  HCT 30.6* 29.9* 27.8* 29.9* 32.9*  MCV 84.3 83.8 84.2 83.3 83.9  PLT 232 233 273 300 384    Basic Metabolic Panel: Recent Labs  Lab 12/21/23 2343 12/22/23 1221 12/23/23 0756 12/24/23 0706 12/26/23 0858  NA 131* 132* 133* 132* 132*  K 4.4 4.5 4.3 4.1 4.5  CL 95* 94* 95* 95* 93*  CO2 24 23 24 23 24   GLUCOSE 86 79 81 102* 89  BUN 41* 49* 31* 43* 33*  CREATININE 7.78* 9.04* 6.36* 7.80* 7.28*  CALCIUM 9.0 8.9 8.5* 9.0 9.3  MG 2.1  --   --   --   --   PHOS 4.4 4.5  --   --   --    GFR: Estimated Creatinine Clearance: 12.2 mL/min (A) (by C-G formula based on SCr of 7.28 mg/dL (H)). Recent Labs  Lab 12/23/23 0756 12/24/23 0706 12/25/23 0701 12/26/23 0858  WBC 7.1 7.9 11.1* 13.2*    Liver Function Tests: Recent Labs  Lab 12/20/23 1404 12/21/23 0640 12/22/23 1221 12/24/23 0706 12/26/23 0858  AST 305* 354*  --  197* 135*  ALT 237* 251*  --  178* 137*  ALKPHOS 189* 191*  --  166* 169*  BILITOT 1.8* 2.5*  --  2.5* 3.3*  PROT 5.7* 6.1*  --  5.7* 6.4*  ALBUMIN 1.7* 1.8* 1.6* 1.6* 1.7*   No results for input(s): "LIPASE", "AMYLASE" in the last 168 hours. No results for input(s): "AMMONIA" in the last 168 hours.  ABG    Component Value Date/Time   PHART 7.373 09/04/2022 1122   PCO2ART 40.4 09/04/2022 1122   PO2ART 54 (L) 09/04/2022 1122   HCO3 23.7 09/04/2022 1122   TCO2 30 09/28/2023 1037   ACIDBASEDEF 2.0 09/04/2022 1122   O2SAT 88 09/04/2022 1122     Coagulation Profile: No  results for input(s): "INR", "PROTIME" in the last 168 hours.  Cardiac Enzymes: No results for input(s): "CKTOTAL", "CKMB", "CKMBINDEX", "TROPONINI" in the last 168 hours.  HbA1C: Hemoglobin A1C  Date/Time Value Ref Range Status  12/22/2015 03:22 PM 5.90  Final   Hgb A1c MFr Bld  Date/Time Value Ref Range Status  02/21/2023 10:59 AM 6.1 (H) 4.8 - 5.6 % Final    Comment:             Prediabetes: 5.7 - 6.4          Diabetes: >6.4  Glycemic control for adults with diabetes: <7.0     CBG: No results for input(s): "GLUCAP" in the last 168 hours.  Review of Systems:   N/a   Past Medical History:  He,  has a past medical history of Acute on chronic systolic CHF (congestive heart failure) (HCC) (06/14/2020), Acute respiratory failure (HCC) (05/28/2022), AICD (automatic cardioverter/defibrillator) present, Allergy, Asthma, Cardiac arrest (HCC) (2015), Chest pain (05/25/2022), CHF (congestive heart failure) (HCC), Coronary artery disease, ESRD on hemodialysis (HCC), Hyperlipidemia, Hypertension, Myocardial infarction (HCC), Obesity, Pneumonia, Shortness of breath dyspnea, Sleep apnea, and Wears glasses.   Surgical History:   Past Surgical History:  Procedure Laterality Date   AV FISTULA PLACEMENT Right 03/15/2019   Procedure: RIGHT ARM ARTERIOVENOUS (AV) FISTULA CREATION;  Surgeon: Chuck Hint, MD;  [facility] OR;  Service: Vascular;  Laterality: Right;   BIOPSY  12/14/2023   Procedure: BIOPSY;  Surgeon: Lemar Lofty., MD;  Location[facility] ENDOSCOPY;  Service: Gastroenterology;;   COLONOSCOPY     COLONOSCOPY W/ BIOPSIES AND POLYPECTOMY     COLONOSCOPY WITH PROPOFOL N/A 09/28/2023   Procedure: COLONOSCOPY WITH PROPOFOL;  Surgeon: Benancio Deeds, MD;  Location: [facility] ENDOSCOPY;  Service: Gastroenterology;  Laterality: N/A;   CORONARY STENT PLACEMENT  2007   ESOPHAGOGASTRODUODENOSCOPY (EGD) WITH PROPOFOL N/A 12/14/2023   Procedure: ESOPHAGOGASTRODUODENOSCOPY  (EGD) WITH PROPOFOL;  Surgeon: Meridee Score Netty Starring., MD;  Location: [facility] ENDOSCOPY;  Service: Gastroenterology;  Laterality: N/A;   ESOPHAGOGASTRODUODENOSCOPY (EGD) WITH PROPOFOL N/A 12/23/2023   Procedure: ESOPHAGOGASTRODUODENOSCOPY (EGD) WITH PROPOFOL;  Surgeon: Meridee Score Netty Starring., MD;  Location: [facility] ENDOSCOPY;  Service: Gastroenterology;  Laterality: N/A;   FINE NEEDLE ASPIRATION  12/14/2023   Procedure: FINE NEEDLE ASPIRATION (FNA) LINEAR;  Surgeon: Lemar Lofty., MD;  Location: [facility] ENDOSCOPY;  Service: Gastroenterology;;   FINE NEEDLE ASPIRATION  12/23/2023   Procedure: FINE NEEDLE ASPIRATION (FNA) LINEAR;  Surgeon: Lemar Lofty., MD;  Location: [facility] ENDOSCOPY;  Service: Gastroenterology;;   HEMOSTASIS CLIP PLACEMENT  12/14/2023   Procedure: HEMOSTASIS CLIP PLACEMENT;  Surgeon: Lemar Lofty., MD;  Location: [facility] ENDOSCOPY;  Service: Gastroenterology;;   IMPLANTABLE CARDIOVERTER DEFIBRILLATOR IMPLANT  2015   POLYPECTOMY  09/28/2023   Procedure: POLYPECTOMY;  Surgeon: Benancio Deeds, MD;  Location: [facility] ENDOSCOPY;  Service: Gastroenterology;;   POLYPECTOMY  12/14/2023   Procedure: POLYPECTOMY;  Surgeon: Lemar Lofty., MD;  Location: [facility] ENDOSCOPY;  Service: Gastroenterology;;   RIGHT/LEFT HEART CATH AND CORONARY ANGIOGRAPHY N/A 07/08/2021   Procedure: RIGHT/LEFT HEART CATH AND CORONARY ANGIOGRAPHY;  Surgeon: Laurey Morale, MD;  Location: [facility] INVASIVE CV LAB;  Service: Cardiovascular;  Laterality: N/A;   RIGHT/LEFT HEART CATH AND CORONARY ANGIOGRAPHY N/A 08/25/2022   Procedure: RIGHT/LEFT HEART CATH AND CORONARY ANGIOGRAPHY;  Surgeon: Laurey Morale, MD;  Location: [facility] INVASIVE CV LAB;  Service: Cardiovascular;  Laterality: N/A;   SUBQ ICD CHANGEOUT N/A 10/13/2020   Procedure: SUBQ ICD CHANGEOUT;  Surgeon: Marinus Maw, MD;  Location: [facility] INVASIVE CV LAB;  Service: Cardiovascular;  Laterality: N/A;   UPPER ESOPHAGEAL ENDOSCOPIC ULTRASOUND (EUS) N/A 12/14/2023    Procedure: UPPER ESOPHAGEAL ENDOSCOPIC ULTRASOUND (EUS);  Surgeon: Lemar Lofty., MD;  Location: [facility] ENDOSCOPY;  Service: Gastroenterology;  Laterality: N/A;   UPPER ESOPHAGEAL ENDOSCOPIC ULTRASOUND (EUS) N/A 12/23/2023   Procedure: UPPER ESOPHAGEAL ENDOSCOPIC ULTRASOUND (EUS);  Surgeon: Lemar Lofty., MD;  Location: [facility] ENDOSCOPY;  Service: Gastroenterology;  Laterality: N/A;     Social History:   reports that he has never smoked. He  has never used smokeless tobacco. He reports that he does not drink alcohol and does not use drugs.   Family History:  His family history includes Hypertension in his mother; Liver disease in his father. There is no history of Colon cancer, Esophageal cancer, Rectal cancer, Stomach cancer, or Colon polyps.   Allergies Allergies  Allergen Reactions   Ace Inhibitors Swelling    Swelling of the tongue   Influenza Vaccines Hives and Swelling    SWELLING REACTION UNSPECIFIED    Ketorolac Swelling    SWELLING REACTION UNSPECIFIED    Lidocaine Swelling    TONGUE SWELLS   Lisinopril Swelling    TONGUE SWELLING Pt reported problem with a BP med which sounded like  lisinopril  But as of 09/19/06,pt had tolerated altace without problem   Penicillins Swelling    TONGUE SWELLS Has patient had a PCN reaction causing immediate rash, facial/tongue/throat swelling, SOB or lightheadedness with hypotension: Yes Has patient had a PCN reaction causing severe rash involving mucus membranes or skin necrosis: No Has patient had a PCN reaction that required hospitalization No Has patient had a PCN reaction occurring within the last 10 years: Yes If all of the above answers are "NO", then may proceed with Cephalosporin use.      Home Medications  Prior to Admission medications   Medication Sig Start Date End Date Taking? Authorizing Provider  albuterol (PROVENTIL) (2.5 MG/3ML) 0.083% nebulizer solution INHALE THE CONTENTS OF 1 VIAL BY MOUTH EVERY 6  HOURS AS NEEDED 10/04/23  Yes [name], MD  albuterol (VENTOLIN HFA) 108 (90 Base) MCG/ACT inhaler INHALE TWO PUFFS BY MOUTH EVERY 4 HOURS AS NEEDED 04/14/23  Yes [name], MD  ALLERGY RELIEF CETIRIZINE 10 MG tablet Take 10 mg by mouth 2 (two) times daily. 08/23/23  Yes [provider]  AMBULATORY NON FORMULARY MEDICATION Medication Name: diltiazem 2% / lidocaine 5% ointment - pea sized amount PR TID 09/28/23  Yes Armbruster, Willaim Rayas, MD  apixaban (ELIQUIS) 5 MG TABS tablet NEW PRESCRIPTION REQUEST: Eliquis 5 Mg - TAKE ONE TABLET BY MOUTH TWICE DAILY 10/03/23  Yes Laurey Morale, MD  atorvastatin (LIPITOR) 80 MG tablet Take 1 tablet (80 mg total) by mouth at bedtime. 11/08/23 11/07/24 Yes Laurey Morale, MD  Cholecalciferol 125 MCG (5000 UT) TABS Take 1 tablet (5,000 Units total) by mouth daily. 12/01/22  Yes [name], MD  docusate sodium (COLACE) 100 MG capsule TAKE ONE CAPSULE BY MOUTH DAILY 07/27/23  Yes [name], MD  EPINEPHrine 0.3 mg/0.3 mL IJ SOAJ injection Inject 0.3 mg into the muscle once as needed for up to 2 doses (if worsening tongue swelling, SOB, hypoxia, or other concerns for progressive anaphylaxis). 12/01/22  Yes [name], MD  ethyl chloride spray SPRAY A SMALL AMOUNT THREE TIMES A WEEK JUST PRIOR TO NEEDLE INSERTION 06/27/23  Yes [name], MD  fluticasone [facility]) 50 MCG/ACT nasal spray INSTILL 1 SPRAY IN EACH NOTRIL DAILY AS NEEDED 10/04/23  Yes [name], MD  hydrOXYzine (ATARAX) 25 MG tablet Take 3 tablets (75 mg total) by mouth at bedtime. 06/27/23  Yes [name], MD  isosorbide-hydrALAZINE (BIDIL) 20-37.5 MG tablet TAKE 1/2 TABLET BY MOUTH THREE TIMES DAILY ON MONDAY, WEDNESDAY, FRIDAY AND SUNDAY 12/08/23  Yes Milford, Anderson Malta, FNP  lanthanum (FOSRENOL) 1000 MG chewable tablet Chew 2 tablets (2,000 mg total) by mouth 3 (three) times daily AND 1 tablet (1,000 mg total) 2 (two)  times daily as  needed with snacks 06/14/23  Yes Anthony Sar, MD  linaclotide [facility]) 72 MCG capsule TAKE ONE CAPSULE BY MOUTH ON MONDAY, WEDNESDAY, FRIDAY AND SATURDAY 10/19/23  Yes [name], MD  mexiletine (MEXITIL) 150 MG capsule Take 2 capsules (300 mg total) by mouth 2 (two) times daily. 10/03/23  Yes Laurey Morale, MD  multivitamin (RENA-VIT) TABS tablet Take 1 tablet by mouth daily. 08/23/23  Yes [provider]  ondansetron (ZOFRAN) 4 MG tablet Take 1 tablet (4 mg total) by mouth 3 (three) times daily before meals. 12/01/23  Yes Unk Lightning, PA  pantoprazole (PROTONIX) 40 MG tablet Take 1 tablet (40 mg total) by mouth 2 (two) times daily before a meal. 12/01/23  Yes Unk Lightning, [place]  Polyethylene Glycol 3350 (PEG 3350) 17 g PACK NEW PRESCRIPTION REQUEST: Polyethylene Glycol 3350- 1 PACKET BY MOUTH DAILY AS NEEDED 12/01/22  Yes [name], MD  tadalafil (CIALIS) 10 MG tablet Take 1 tablet (10 mg total) by mouth daily as needed 30 min prior to sexual activity.  Do not take with Imdur (isosorbide) or nitroglycerin 02/22/23  Yes   amiodarone (PACERONE) 200 MG tablet TAKE ONE (1) TABLET BY MOUTH TWICE DAILY 12/19/23   Laurey Morale, MD  budesonide-formoterol [facility]) 80-4.5 MCG/ACT inhaler INHALE TWO PUFFS BY MOUTH TWICE DAILY 12/23/23   [name], MD  carvedilol (COREG) 6.25 MG tablet TAKE ONE TABLET BY MOUTH TWICE DAILY 12/19/23   Laurey Morale, MD  doxycycline (VIBRA-TABS) 100 MG tablet Take 1 tablet (100 mg total) by mouth 2 (two) times daily as directed Patient not taking: Reported on 12/11/2023 11/15/23        Critical care time: 40 mins     Christian Ralph Benavidez AGACNP-BC   [facility] 12/26/2023, 4:19 PM  Please see [email or web address] for pager details.  From 7A-7P if no response, please call [phone]. After hours, please call ELink [phone].

## 2023-12-27 ENCOUNTER — Inpatient Hospital Stay (HOSPITAL_COMMUNITY): Payer: 59

## 2023-12-27 DIAGNOSIS — I5022 Chronic systolic (congestive) heart failure: Secondary | ICD-10-CM

## 2023-12-27 DIAGNOSIS — I472 Ventricular tachycardia, unspecified: Secondary | ICD-10-CM | POA: Diagnosis not present

## 2023-12-27 DIAGNOSIS — I469 Cardiac arrest, cause unspecified: Secondary | ICD-10-CM

## 2023-12-27 DIAGNOSIS — K8689 Other specified diseases of pancreas: Secondary | ICD-10-CM | POA: Diagnosis not present

## 2023-12-27 LAB — POCT I-STAT 7, (LYTES, BLD GAS, ICA,H+H)
Acid-base deficit: 3 mmol/L — ABNORMAL HIGH (ref 0.0–2.0)
Bicarbonate: 23.1 mmol/L (ref 20.0–28.0)
Calcium, Ion: 1.17 mmol/L (ref 1.15–1.40)
HCT: 32 % — ABNORMAL LOW (ref 39.0–52.0)
Hemoglobin: 10.9 g/dL — ABNORMAL LOW (ref 13.0–17.0)
O2 Saturation: 96 %
Potassium: 6.2 mmol/L — ABNORMAL HIGH (ref 3.5–5.1)
Sodium: 134 mmol/L — ABNORMAL LOW (ref 135–145)
TCO2: 24 mmol/L (ref 22–32)
pCO2 arterial: 44 mm[Hg] (ref 32–48)
pH, Arterial: 7.329 — ABNORMAL LOW (ref 7.35–7.45)
pO2, Arterial: 88 mm[Hg] (ref 83–108)

## 2023-12-27 LAB — BASIC METABOLIC PANEL
Anion gap: 16 — ABNORMAL HIGH (ref 5–15)
BUN: 40 mg/dL — ABNORMAL HIGH (ref 6–20)
CO2: 22 mmol/L (ref 22–32)
Calcium: 9.3 mg/dL (ref 8.9–10.3)
Chloride: 96 mmol/L — ABNORMAL LOW (ref 98–111)
Creatinine, Ser: 8.31 mg/dL — ABNORMAL HIGH (ref 0.61–1.24)
GFR, Estimated: 7 mL/min — ABNORMAL LOW (ref >60)
Glucose, Bld: 86 mg/dL (ref 70–99)
Potassium: 5.1 mmol/L (ref 3.5–5.1)
Sodium: 134 mmol/L — ABNORMAL LOW (ref 135–145)

## 2023-12-27 LAB — LACTIC ACID, PLASMA: Lactic Acid, Venous: 1.4 mmol/L (ref 0.5–1.9)

## 2023-12-27 LAB — CBC
HCT: 30.5 % — ABNORMAL LOW (ref 39.0–52.0)
Hemoglobin: 10.6 g/dL — ABNORMAL LOW (ref 13.0–17.0)
MCH: 29 pg (ref 26.0–34.0)
MCHC: 34.8 g/dL (ref 30.0–36.0)
MCV: 83.3 fL (ref 80.0–100.0)
Platelets: 385 10*3/uL (ref 150–400)
RBC: 3.66 MIL/uL — ABNORMAL LOW (ref 4.22–5.81)
RDW: 17.7 % — ABNORMAL HIGH (ref 11.5–15.5)
WBC: 15.4 10*3/uL — ABNORMAL HIGH (ref 4.0–10.5)
nRBC: 0.4 % — ABNORMAL HIGH (ref 0.0–0.2)

## 2023-12-27 LAB — BLOOD GAS, ARTERIAL
Acid-Base Excess: 1 mmol/L (ref 0.0–2.0)
Bicarbonate: 25.2 mmol/L (ref 20.0–28.0)
Drawn by: 33176
O2 Saturation: 96.7 %
Patient temperature: 36.4
pCO2 arterial: 37 mm[Hg] (ref 32–48)
pH, Arterial: 7.44 (ref 7.35–7.45)
pO2, Arterial: 73 mm[Hg] — ABNORMAL LOW (ref 83–108)

## 2023-12-27 LAB — MAGNESIUM: Magnesium: 2.1 mg/dL (ref 1.7–2.4)

## 2023-12-27 LAB — GLUCOSE, CAPILLARY: Glucose-Capillary: 92 mg/dL (ref 70–99)

## 2023-12-27 LAB — PHOSPHORUS: Phosphorus: 4.6 mg/dL (ref 2.5–4.6)

## 2023-12-27 MED ORDER — VANCOMYCIN HCL 2000 MG/400ML IV SOLN
2000.0000 mg | Freq: Once | INTRAVENOUS | Status: DC
  Filled 2023-12-27: qty 400

## 2023-12-27 MED ORDER — FENTANYL CITRATE PF 50 MCG/ML IJ SOSY
PREFILLED_SYRINGE | INTRAMUSCULAR | Status: AC
  Administered 2023-12-27: 100 ug via INTRAVENOUS
  Filled 2023-12-27: qty 2

## 2023-12-27 MED ORDER — SODIUM BICARBONATE 8.4 % IV SOLN
50.0000 meq | Freq: Once | INTRAVENOUS | Status: AC
  Administered 2023-12-27: 50 meq via INTRAVENOUS

## 2023-12-27 MED ORDER — PRISMASOL BGK 0/2.5 32-2.5 MEQ/L EC SOLN
Status: DC
  Filled 2023-12-27 (×8): qty 5000

## 2023-12-27 MED ORDER — SODIUM CHLORIDE 0.9 % IV SOLN: 2.0000 g | Freq: Once | INTRAVENOUS | Status: DC

## 2023-12-27 MED ORDER — MIDAZOLAM HCL 2 MG/2ML IJ SOLN
2.0000 mg | Freq: Once | INTRAMUSCULAR | Status: AC
  Administered 2023-12-27: 2 mg via INTRAVENOUS
  Filled 2023-12-27: qty 2

## 2023-12-27 MED ORDER — MEXILETINE HCL 150 MG PO CAPS
300.0000 mg | ORAL_CAPSULE | Freq: Two times a day (BID) | ORAL | Status: DC
  Filled 2023-12-27: qty 2

## 2023-12-27 MED ORDER — NOREPINEPHRINE 4 MG/250ML-% IV SOLN
0.0000 ug/min | INTRAVENOUS | Status: DC
Start: 2023-12-27 — End: 2023-12-28
  Administered 2023-12-27: 30 ug/min via INTRAVENOUS

## 2023-12-27 MED ORDER — MIDAZOLAM-SODIUM CHLORIDE 100-0.9 MG/100ML-% IV SOLN: 0.5000 mg/h | INTRAVENOUS | Status: DC

## 2023-12-27 MED ORDER — ETOMIDATE 2 MG/ML IV SOLN
10.0000 mg | Freq: Once | INTRAVENOUS | Status: AC
  Administered 2023-12-27: 10 mg via INTRAVENOUS
  Filled 2023-12-27: qty 10

## 2023-12-27 MED ORDER — MIDAZOLAM HCL 2 MG/2ML IJ SOLN
INTRAMUSCULAR | Status: AC
  Filled 2023-12-27: qty 2

## 2023-12-27 MED ORDER — DOCUSATE SODIUM 50 MG/5ML PO LIQD: 100.0000 mg | Freq: Two times a day (BID) | ORAL | Status: DC

## 2023-12-27 MED ORDER — FENTANYL CITRATE PF 50 MCG/ML IJ SOSY
50.0000 ug | PREFILLED_SYRINGE | Freq: Once | INTRAMUSCULAR | Status: AC
  Administered 2023-12-27: 50 ug via INTRAVENOUS
  Filled 2023-12-27: qty 1

## 2023-12-27 MED ORDER — PANTOPRAZOLE SODIUM 40 MG IV SOLR: 40.0000 mg | Freq: Two times a day (BID) | INTRAVENOUS | Status: DC

## 2023-12-27 MED ORDER — SODIUM BICARBONATE 8.4 % IV SOLN
INTRAVENOUS | Status: AC
  Filled 2023-12-27: qty 100

## 2023-12-27 MED ORDER — SODIUM BICARBONATE 8.4 % IV SOLN
INTRAVENOUS | Status: AC
  Filled 2023-12-27: qty 50

## 2023-12-27 MED ORDER — EPINEPHRINE 1 MG/10ML IJ SOSY
PREFILLED_SYRINGE | INTRAMUSCULAR | Status: AC
  Filled 2023-12-27: qty 10

## 2023-12-27 MED ORDER — VANCOMYCIN HCL IN DEXTROSE 1-5 GM/200ML-% IV SOLN: 1000.0000 mg | Freq: Once | INTRAVENOUS | Status: DC

## 2023-12-27 MED ORDER — POLYETHYLENE GLYCOL 3350 17 G PO PACK: 17.0000 g | PACK | Freq: Every day | ORAL | Status: DC

## 2023-12-27 MED ORDER — KETAMINE HCL 50 MG/5ML IJ SOSY
PREFILLED_SYRINGE | INTRAMUSCULAR | Status: AC
  Filled 2023-12-27: qty 10

## 2023-12-27 MED ORDER — AMIODARONE HCL IN DEXTROSE 360-4.14 MG/200ML-% IV SOLN
30.0000 mg/h | INTRAVENOUS | Status: DC
  Filled 2023-12-27: qty 200

## 2023-12-27 MED ORDER — EPINEPHRINE HCL 5 MG/250ML IV SOLN IN NS
5.0000 ug/min | INTRAVENOUS | Status: DC
  Administered 2023-12-27: 2 ug/min via INTRAVENOUS

## 2023-12-27 MED ORDER — PRISMASOL BGK 0/2.5 32-2.5 MEQ/L EC SOLN
Status: DC
  Filled 2023-12-27 (×3): qty 5000

## 2023-12-27 MED ORDER — FENTANYL CITRATE PF 50 MCG/ML IJ SOSY: 100.0000 ug | PREFILLED_SYRINGE | Freq: Once | INTRAMUSCULAR | Status: AC

## 2023-12-27 MED ORDER — HEPARIN SODIUM (PORCINE) 1000 UNIT/ML DIALYSIS: 1000.0000 [IU] | INTRAMUSCULAR | Status: DC | PRN

## 2023-12-27 MED ORDER — ROCURONIUM BROMIDE 10 MG/ML (PF) SYRINGE
PREFILLED_SYRINGE | INTRAVENOUS | Status: AC
  Filled 2023-12-27: qty 10

## 2023-12-27 MED ORDER — MIDAZOLAM HCL 2 MG/2ML IJ SOLN
1.0000 mg | INTRAMUSCULAR | Status: DC | PRN
  Administered 2023-12-27: 2 mg via INTRAVENOUS

## 2023-12-27 MED ORDER — AMIODARONE HCL IN DEXTROSE 360-4.14 MG/200ML-% IV SOLN
INTRAVENOUS | Status: AC
  Administered 2023-12-27: 30 mg/h via INTRAVENOUS
  Filled 2023-12-27: qty 200

## 2023-12-27 MED ORDER — EPINEPHRINE HCL 5 MG/250ML IV SOLN IN NS
INTRAVENOUS | Status: AC
  Filled 2023-12-27: qty 250

## 2023-12-27 MED ORDER — SODIUM CHLORIDE 0.9 % IV SOLN
1.0000 g | Freq: Once | INTRAVENOUS | Status: DC
  Filled 2023-12-27: qty 10

## 2023-12-27 MED ORDER — VASOPRESSIN 20 UNITS/100 ML INFUSION FOR SHOCK
0.0000 [IU]/min | INTRAVENOUS | Status: DC
  Administered 2023-12-27: 0.04 [IU]/min via INTRAVENOUS
  Filled 2023-12-27: qty 100

## 2023-12-27 MED ORDER — NOREPINEPHRINE 4 MG/250ML-% IV SOLN
0.0000 ug/min | INTRAVENOUS | Status: DC
  Administered 2023-12-27: 6 ug/min via INTRAVENOUS
  Filled 2023-12-27 (×2): qty 250

## 2023-12-27 MED ORDER — FENTANYL 2500MCG IN NS 250ML (10MCG/ML) PREMIX INFUSION
25.0000 ug/h | INTRAVENOUS | Status: DC
  Administered 2023-12-27: 25 ug/h via INTRAVENOUS
  Filled 2023-12-27: qty 250

## 2023-12-27 MED ORDER — FENTANYL BOLUS VIA INFUSION
50.0000 ug | INTRAVENOUS | Status: DC | PRN
  Administered 2023-12-27: 100 ug via INTRAVENOUS

## 2023-12-27 MED ORDER — NOREPINEPHRINE 4 MG/250ML-% IV SOLN
INTRAVENOUS | Status: AC
  Filled 2023-12-27: qty 250

## 2023-12-27 MED ORDER — SODIUM CHLORIDE 0.9 % IV SOLN: 250.0000 mL | INTRAVENOUS | Status: DC

## 2023-12-27 MED ORDER — ETOMIDATE 2 MG/ML IV SOLN
INTRAVENOUS | Status: AC
  Filled 2023-12-27: qty 20

## 2023-12-28 ENCOUNTER — Other Ambulatory Visit: Payer: Self-pay | Admitting: Internal Medicine

## 2023-12-29 ENCOUNTER — Ambulatory Visit: Payer: 59 | Admitting: Physician Assistant

## 2024-01-07 NOTE — Progress Notes (Signed)
Night Chaplain followed up with additional support for family, especially pt's mother who had just arrived.  Chaplain offered ministry of compassion as mother told the story of her son sending her home yesterday because he didn't want her to be with him when he died.  Chaplain supported mother's belief that her son did that out of love for her.  This family is one of deep faith, expressing they are confident Humberto is with Jesus now.  We all imagined what that looks like.  Chaplain prayed at bedside with mother and other family members, prayers of thanksgiving for the life of Kavish and prayers of commendation, releasing him to his Father in [place].  Vernell Morgans Chaplain

## 2024-01-07 NOTE — Procedures (Signed)
Central Venous Catheter Insertion Procedure Note  Victor Thompson  [id number]  14-Mar-1961  Date:  Time:3:46 PM   Provider Performing:Masato Pettie   Procedure: Insertion of Non-tunneled Central Venous Catheter(36556) with US guidance (08657)   Indication(s) Medication administration  Consent Unable to obtain consent due to emergent nature of procedure.  Anesthesia Topical only with 1% lidocaine   Timeout Verified patient identification, verified procedure, site/side was marked, verified correct patient position, special equipment/implants available, medications/allergies/relevant history reviewed, required imaging and test results available.  Sterile Technique Maximal sterile technique including full sterile barrier drape, hand hygiene, sterile gown, sterile gloves, mask, hair covering, sterile ultrasound probe cover (if used).  Procedure Description Area of catheter insertion was cleaned with chlorhexidine and draped in sterile fashion.  With real-time ultrasound guidance a central venous catheter was placed into the left subclavian vein. Nonpulsatile blood flow and easy flushing noted in all ports.  The catheter was sutured in place and sterile dressing applied.  Complications/Tolerance None; patient tolerated the procedure well. Chest X-ray is ordered to verify placement for internal jugular or subclavian cannulation.   Chest x-ray is not ordered for femoral cannulation.  EBL Minimal  Specimen(s) None   Lynnell Catalan, MD [facility] ICU Physician [facility]  Critical Care  Pager: [phone] Or Epic Secure Chat After hours: [phone].  , 3:47 PM

## 2024-01-07 NOTE — Procedures (Addendum)
Central Venous Catheter Insertion Procedure Note  BENARD DESIMONE  [id number]  Nov 17, 1961  Date:  Time:3:42 PM   Provider Performing:Brailon Don   Procedure: Insertion of Non-tunneled Central Venous Catheter(36556) with US guidance (46962)   Indication(s) Hemodialysis  Consent Unable to obtain consent due to emergent nature of procedure.  Anesthesia Topical only with 1% lidocaine   Timeout Verified patient identification, verified procedure, site/side was marked, verified correct patient position, special equipment/implants available, medications/allergies/relevant history reviewed, required imaging and test results available.  Sterile Technique Maximal sterile technique including full sterile barrier drape, hand hygiene, sterile gown, sterile gloves, mask, hair covering, sterile ultrasound probe cover (if used).  Procedure Description Area of catheter insertion was cleaned with chlorhexidine and draped in sterile fashion.  With real-time ultrasound guidance a HD catheter was placed into the right femoral vein. Nonpulsatile blood flow and easy flushing noted in all ports.  The catheter was sutured in place and sterile dressing applied.  Complications/Tolerance None; patient tolerated the procedure well. Chest X-ray is ordered to verify placement for internal jugular or subclavian cannulation.   Chest x-ray is not ordered for femoral cannulation.  EBL Minimal  Specimen(s)  Lynnell Catalan, MD [facility] ICU Physician [facility] Critical Care  Pager: [phone] Or Epic Secure Chat After hours: [phone].  , 3:43 PM

## 2024-01-07 NOTE — Progress Notes (Addendum)
Patient arrived to unit with Levo infusing in PIV. Amiodarone started. Dialysis catheter & A-line placed. Vaso & fentanyl started. Patient intubated & central line placed. Patient going in and out of slow v-tach while all of this was taking place. Pressors requirements steadily increasing. Shortly after patient was intubated, patient A-line damped and patient began having agonal breathing. Code started. See code documentation.

## 2024-01-07 NOTE — Progress Notes (Signed)
    1700  Spiritual Encounters  Type of Visit Follow up  Care provided to: [facility] partners present during encounter Nurse  Referral source Code page  Reason for visit Patient death  OnCall Visit Yes  Spiritual Framework  Presenting Themes Meaning/purpose/sources of inspiration;Values and beliefs;Significant life change;Community and relationships  Community/Connection Family;Faith community  Family Stress Factors Loss   Chaplain visited the family for follow up. Pt's mother, sister and niece arrived. Whole family was grieving. Chaplain was present in the room and normalized emotions and feelings.   Lindell Spar Chaplain Resident [phone]

## 2024-01-07 NOTE — Progress Notes (Signed)
    1500  Spiritual Encounters  Type of Visit Initial  Care provided to: [facility] partners present during encounter Nurse  Referral source Code page  Reason for visit Patient death  OnCall Visit Yes  Spiritual Framework  Presenting Themes Impactful experiences and emotions;Coping tools;Community and relationships  Community/Connection Family;Faith community  Family Stress Factors Loss  Interventions  Spiritual Care Interventions Made Established relationship of care and support;Compassionate presence;Reflective listening;Supported grief process  Intervention Outcomes  Outcomes Connection to spiritual care;Awareness around self/spiritual resourses;Awareness of support  Spiritual Care Plan  Spiritual Care Issues Still Outstanding Chaplain will continue to follow    Chaplain was paged to the code blue. Chaplain stayed with pt's fiance and supported her in grief process. She shared some beautiful memories that they had together. His niece and sister was in bedside. Chaplain reflectively listened and provided spiritual support.  Lindell Spar Chaplain Resident, BA [phone]

## 2024-01-07 NOTE — Progress Notes (Signed)
[facility] Progress Note   Subjective:  RR called overnight - hypotension/hypoxia. Moved to progressive unit Runs of VT noted - asymptomatic  On HFNC. SBP 90s this am. CXR with pulm edema  He's alert but lethargic,  no particular complaints. For dialysis today   Objective Vitals:    0320  0430  0500  0808  BP: (!) 79/65 (!) 90/54  (!) 92/45  Pulse: 76 78  86  Resp: (!) 30 (!) 28  (!) 31  Temp:  98.4 F (36.9 C) 98.3 F (36.8 C) 97.6 F (36.4 C)  TempSrc:  Axillary Oral Oral  SpO2: 95% 98%  95%  Weight:   102.9 kg   Height:          Additional Objective Labs: Basic Metabolic Panel: Recent Labs  Lab 12/21/23 2343 12/22/23 1221 12/23/23 0756 12/24/23 0706 12/26/23 0858  0320  NA 131* 132*   < > 132* 132* 134*  K 4.4 4.5   < > 4.1 4.5 5.1  CL 95* 94*   < > 95* 93* 96*  CO2 24 23   < > 23 24 22   GLUCOSE 86 79   < > 102* 89 86  BUN 41* 49*   < > 43* 33* 40*  CREATININE 7.78* 9.04*   < > 7.80* 7.28* 8.31*  CALCIUM 9.0 8.9   < > 9.0 9.3 9.3  PHOS 4.4 4.5  --   --   --  4.6   < > = values in this interval not displayed.   CBC: Recent Labs  Lab 12/24/23 0706 12/25/23 0701 12/26/23 0858 12/26/23 1711  0320  WBC 7.9 11.1* 13.2* 15.4* 15.4*  HGB 9.7* 10.4* 11.4* 10.6* 10.6*  HCT 27.8* 29.9* 32.9* 30.6* 30.5*  MCV 84.2 83.3 83.9 85.0 83.3  PLT 273 300 384 401* 385   Blood Culture    Component Value Date/Time   SDES BLOOD SITE NOT SPECIFIED 12/20/2023 2240   SPECREQUEST  12/20/2023 2240    BOTTLES DRAWN AEROBIC AND ANAEROBIC Blood Culture results may not be optimal due to an inadequate volume of blood received in culture bottles   CULT  12/20/2023 2240    NO GROWTH 5 DAYS Performed at [facility], [address], [place], [place] [address]    REPTSTATUS 12/25/2023 FINAL 12/20/2023 2240     Physical Exam General: Alert, lying in bed, nad Heart: RRR No rub Lungs: Clear, coarse breath  sounds Abdomen: soft non-tender  Extremities: No LE edema  Dialysis Access: R AVF +bruit   Medications:  albumin human      amiodarone  200 mg Oral BID   apixaban  5 mg Oral BID   Chlorhexidine Gluconate Cloth  6 each Topical Q0600   docusate sodium  100 mg Oral BID   fluticasone furoate-vilanterol  1 puff Inhalation Daily   hydrOXYzine  25 mg Oral TID   lanthanum  2,000 mg Oral TID WC   mexiletine  300 mg Oral BID   midodrine  15 mg Oral TID WC   pantoprazole  40 mg Oral BID   polyethylene glycol  17 g Oral BID    Dialysis Orders: [facility]   4h   450 /1.5  102kg  2/2 bath   AVF   Heparin 3000 - hect 9 micrograms three times per week  Assessment/Plan: Pancreatic mass - w/ abd pain, CT +for mass a head of pancreas, also multiple local enlarged LN's. SP EUS on  1/08 w/ biopsy appearing not malignant and more c/w with chronic pancreatitis. Rpt EGD 1/17 with biopsy - cytology negative for malignancy. Repeat CT recommended 3-4 weeks.  ESKD - on HD TTS. Next HD 1/21 UF as tolerated 1-2L  HTN - BP's soft. DC'd bidil for now. On MTP and prn midodrine; per cardiology 2/2 #8;  Volume - CXR  1/20 -with pulm edema. Unclear how much UF he can tolerate. Limited by hypotension/CM Anemia of eskd - Hb 10-11, not on esa at OP unit. Follow.  Secondary hyperparathyroidism - CCa in range. Cont binders w/ meals.  H/o combined heart failure H/o VT storm/ ischemic CM/ PAF - sp ICD and takes mexilitene, BB and amiodarone. Cardiology following - not stable for cardiac PET Debility / Decline: Overall poor prognosis, palliative following, FC   Tomasa Blase PA-C [facility] ,9:12 AM

## 2024-01-07 NOTE — Progress Notes (Signed)
OT Cancellation Note  Patient Details Name: Victor Thompson MRN: [id number] DOB: 1961/01/22   Cancelled Treatment:    Reason Eval/Treat Not Completed: Other (comment) (Nursing asking to hold at this time.)  Presley Raddle OTR/L  Acute Rehab Services  [phone] office number   Alphia Moh , 8:26 AM

## 2024-01-07 NOTE — Progress Notes (Signed)
Critical care progress note  63 year old man with known advanced HFrEF with an EF of less than 20% and recurrent ventricular tachycardia who presented with abdominal pain and was found to have a pancreatic mass and was diagnosed with chronic pancreatitis. He had had episodes of ventricular storm during this admission and dialysis treatment was complicated by hypotension.  Overnight he developed shortness of breath and decreased responsiveness.  He was placed on BiPAP and was transferred in the morning when he was hypotensive and still somnolent.  On examination he is an obese man who is awake but minimally responsive in respiratory distress.  Extremities were cool.  Given his hypotension we opted to place a dialysis catheter and arterial line for medication administration blood pressure monitoring prior to proceeding to intubation.  He was started on increasing doses of Levophed.  At the time of intubation he was minimally responsive and had what appeared to be twitching possibly consistent with seizure activity.  Following intubation his oxygenation and blood pressure improved initially.  A triple-lumen catheter was inserted for CVP and SCV O2 monitoring.  While he initially stabilized he then suddenly became hypotensive and increasingly bradycardic with a widening QRS.  He was given bicarbonate and calcium.  Amiodarone was stopped and he was administered epinephrine.  Unfortunately he suffered recurrent cardiac arrest eventually no longer responsive to boluses of epinephrine.  Dr. Shirlee Latch and I then decided to stop resuscitative efforts and the patient was pronounced deceased at 19.  CRITICAL CARE Performed by: Lynnell Catalan   Total critical care time: 60 minutes  Critical care time was exclusive of separately billable procedures and treating other patients.  Critical care was necessary to treat or prevent imminent or life-threatening deterioration.  Critical care was time spent personally  by me on the following activities: development of treatment plan with patient and/or surrogate as well as nursing, discussions with consultants, evaluation of patient's response to treatment, examination of patient, obtaining history from patient or surrogate, ordering and performing treatments and interventions, ordering and review of laboratory studies, ordering and review of radiographic studies, pulse oximetry, re-evaluation of patient's condition and participation in multidisciplinary rounds.  Lynnell Catalan, MD [facility] ICU Physician [facility]  Pager: [phone] Mobile: [phone] After hours: [phone].

## 2024-01-07 NOTE — Death Summary Note (Signed)
  Name: Victor Thompson MRN: [id number] DOB: 1961-08-28 63 y.o.  Date of Admission: 12/10/2023 11:02 AM Date of Discharge:  Attending Physician: Lynnell Catalan, MD  Discharge Diagnosis: Principal Problem:   Pancreatic mass Active Problems:   Combined congestive systolic and diastolic heart failure (HCC)   Reactive airway disease   ESRD (end stage renal disease) (HCC)   Hypotension   Hypokalemia   Elevated troponin   History of ventricular tachycardia s/p ICD   Chest pain   Abdominal pain   Gastritis and gastroduodenitis   Idiopathic acute pancreatitis without infection or necrosis   Pancreatic lesion   Abnormal CT of the abdomen   History of pancreatitis   Cause of death: Severe septic/distributive shock; asystole  Time of death: 1324  Disposition and follow-up:   [name] was discharged from [facility] in expired condition.    Hospital Course: Victor Thompson is a 63 yo male with ESRD, end-stage HFrEF, VT storm s/p AICD, and OSA who presented with shortness of breath and abdominal pain and admitted for necrotizing pancreatitis. He became hypotensive on 1/20 and critical care was consulted. CXR was suggestive of bilateral pulmonary edema - he was not diuresed as he no longer makes urine. Antihypertensive medications were held and the patient was started on midodrine. Overnight he remained hypotensive and started having frequent runs of VT on telemetry, though he was asymptomatic. As the day progressed he remained hypotensive with intermittent runs of VT and became somnolent. He was then transferred to the ICU and started on pressor support with levophed. Vasopressin added for additional BP support. Arterial line was placed better BP regulation and central access was obtained for medication delivery.  Patient initially stabilized and it was thought that he most likely had distributive/septic shock that was superimposed on his baseline HFrEF. His blood  pressure subsequently deteriorated and he was started on an epinephrine drip and received multiple amps of bicarb. Bedside echo revealed global hypokinesis of the right ventricle. He then developed PEA with a slow, wide complex rhythm. He had CPR with multiple rounds of epi and bicarb given. Ultimately he developed asystole and expired at 1324.   SignedChauncey Mann, DO , 3:23 PM

## 2024-01-07 NOTE — Progress Notes (Cosign Needed)
Rounding Note    [name] Date of Encounter:   [facility] Cardiologist: Marca Ancona, MD   Subjective   Lethargic, but wakes, warm  Inpatient Medications    Scheduled Meds:  apixaban  5 mg Oral BID   Chlorhexidine Gluconate Cloth  6 each Topical Q0600   docusate sodium  100 mg Oral BID   EPINEPHrine       etomidate       fentaNYL       fluticasone furoate-vilanterol  1 puff Inhalation Daily   hydrOXYzine  25 mg Oral TID   lanthanum  2,000 mg Oral TID WC   mexiletine  300 mg Oral BID   midazolam       midodrine  15 mg Oral TID WC   pantoprazole  40 mg Oral BID   polyethylene glycol  17 g Oral BID   rocuronium       Continuous Infusions:  sodium chloride     albumin human     amiodarone     amiodarone     ceFEPime (MAXIPIME) IV     norepinephrine (LEVOPHED) Adult infusion 6 mcg/min ( 1034)   vancomycin     PRN Meds: acetaminophen **OR** acetaminophen, amiodarone, bisacodyl, calcium carbonate, EPINEPHrine, etomidate, fentaNYL, lanthanum **AND** lanthanum, levalbuterol, midazolam, nitroGLYCERIN, mouth rinse, rocuronium, senna, simethicone   Vital Signs    Vitals:    1039  1042  1045  1100  BP: 91/75 (!) 86/69 (!) 83/65 92/73  Pulse: 77 77 77 99  Resp: (!) 28 (!) 27 (!) 24 (!) 31  Temp:      TempSrc:      SpO2: 94% 94% 92% (!) 85%  Weight:      Height:       No intake or output data in the 24 hours ending  1139        5:00 AM 12/26/2023    6:47 AM 12/25/2023    4:29 AM  Last 3 Weights  Weight (lbs) 226 lb 13.7 oz 221 lb 229 lb 8 oz  Weight (kg) 102.9 kg 100.245 kg 104.1 kg      Telemetry    SR > overnight/early this AM had some runs of VT now with more persistent/sustained runs of VT, at times very broad, >> with BP support/pressor on board, rhythm is improving - Personally Reviewed  ECG    Not done at the time of visit - Personally  Reviewed  Physical Exam   GEN: lethargic, but wakes, follows commands  Neck: No JVD Cardiac: RRR, no murmurs, rubs, or gallops.  Respiratory: CTA b/l (ant auscultation only). GI: Soft, nontender, non-distended  MS: No edema; No deformity. Neuro:  unable to assess well  Labs    High Sensitivity Troponin:   Recent Labs  Lab 12/10/23 2329 12/11/23 0216 12/11/23 0400 12/17/23 0929 12/17/23 1135  TROPONINIHS 50* 47* 47* 381* 361*     Chemistry Recent Labs  Lab 12/21/23 0640 12/21/23 2343 12/22/23 1221 12/23/23 0756 12/24/23 0706 12/26/23 0858  0320  NA 133* 131* 132*   < > 132* 132* 134*  K 4.6 4.4 4.5   < > 4.1 4.5 5.1  CL 94* 95* 94*   < > 95* 93* 96*  CO2 25 24 23    < > 23 24 22   GLUCOSE 91 86 79   < > 102* 89 86  BUN 32* 41* 49*   < > 43* 33* 40*  CREATININE 7.09* 7.78*  9.04*   < > 7.80* 7.28* 8.31*  CALCIUM 8.6* 9.0 8.9   < > 9.0 9.3 9.3  MG  --  2.1  --   --   --   --  2.1  PROT 6.1*  --   --   --  5.7* 6.4*  --   ALBUMIN 1.8*  --  1.6*  --  1.6* 1.7*  --   AST 354*  --   --   --  197* 135*  --   ALT 251*  --   --   --  178* 137*  --   ALKPHOS 191*  --   --   --  166* 169*  --   BILITOT 2.5*  --   --   --  2.5* 3.3*  --   GFRNONAA 8* 7* 6*   < > 7* 8* 7*  ANIONGAP 14 12 15    < > 14 15 16*   < > = values in this interval not displayed.    Lipids No results for input(s): "CHOL", "TRIG", "HDL", "LABVLDL", "LDLCALC", "CHOLHDL" in the last 168 hours.  Hematology Recent Labs  Lab 12/26/23 0858 12/26/23 1711  0320  WBC 13.2* 15.4* 15.4*  RBC 3.92* 3.60* 3.66*  HGB 11.4* 10.6* 10.6*  HCT 32.9* 30.6* 30.5*  MCV 83.9 85.0 83.3  MCH 29.1 29.4 29.0  MCHC 34.7 34.6 34.8  RDW 17.9* 17.9* 17.7*  PLT 384 401* 385   Thyroid No results for input(s): "TSH", "FREET4" in the last 168 hours.  BNP Recent Labs  Lab 12/26/23 1711  BNP 450.0*    DDimer No results for input(s): "DDIMER" in the last 168 hours.   Radiology    DG CHEST PORT 1  VIEW Result Date: 12/26/2023 CLINICAL DATA:  Shortness of breath. EXAM: PORTABLE CHEST 1 VIEW COMPARISON:  December 10, 2023. FINDINGS: Mild cardiomegaly is noted with central pulmonary vascular congestion. Probable bilateral pulmonary edema is noted with small pleural effusions. Bony thorax is unremarkable. IMPRESSION: Mild cardiomegaly with mild central pulmonary vascular congestion and probable bilateral pulmonary edema and small pleural effusions. Electronically Signed   By: Lupita Raider M.D.   On: 12/26/2023 10:53    Cardiac Studies   Echo 12/11/23:   1. Left ventricular ejection fraction, by estimation, is 30 to 35%. The  left ventricle has moderately decreased function. The left ventricle  demonstrates global hypokinesis. The left ventricular internal cavity size  was moderately dilated. Left  ventricular diastolic parameters are consistent with Grade I diastolic  dysfunction (impaired relaxation).   2. Right ventricular systolic function is normal. The right ventricular  size is normal. Tricuspid regurgitation signal is inadequate for assessing  PA pressure.   3. The mitral valve is abnormal. Mild mitral valve regurgitation. No  evidence of mitral stenosis.   4. The tricuspid valve is abnormal.   5. The aortic valve is tricuspid. Aortic valve regurgitation is not  visualized. No aortic stenosis is present.   6. Aortic dilatation noted. There is mild dilatation of the ascending  aorta, measuring 36 mm.   7. The inferior vena cava is normal in size with greater than 50%  respiratory variability, suggesting right atrial pressure of 3 mmHg.    RHC/LHC 08/25/22:   1st Diag lesion is 60% stenosed.   Ost LAD to Prox LAD lesion is 40% stenosed.   Mid Cx lesion is 30% stenosed.   Prox RCA lesion is 30% stenosed.   1. Elevated right and  left heart filling pressures with pulmonary venous hypertension.  2. Nonobstructive CAD.   Patient Profile     63 y.o. male w/PMHx of CAD (PCI 2007),  ICM w/hx of arrest and ICD (at [facility] in 2015), HTN, HLD, ESRF on HD, chronic CHF (systolic), OSA w/CPAP, morbid obesity, he has h/o seeing/following with pulmonary with episodes of angioedema of unknown trigger that requires epi pen use , AFib, flutter    Device information BSCi S-ICD, implanted 2015 at [facility] + h/o VT, VF and appropriate shocks Has had hospitalizations with VT storms/refractory VT (last Oct 2023, required intubation and sedation ) Mexiletine added Oct 2023 Not felt to be a candidate for VT ablation (not likely to be successful)   Arrhythmia/AAD hx Amiodarone started June 2023 REPORTED ALLERGY TO LIDOCAINE Quinidine and procainamide tried oct 2023 both ineffective  Presented 12/10/23 with abdominal pain and found to have pancreatic head mass by CT scan. EUS with biopsy 1/17 returned negative for malignancy, but positive for acute and chronic inflammation and necrosis. Follow up MRCP recommended but refused. Admission has been complicated by recurrence of VT storm, hypotension, and confusion. EP consulted and recommended amio, toprol XL, and mexiletine. Management has been complicated by hypotension,  on Midodrine up to 15 mg tid. 12/26/23 developed shortness of breath and somnolence and was placed on BiPAP.    Of note, Palliative Care discussion notes 1/14 resulting in DNR/DNI from patient and family, however was reversed to Full Code 12/21/23.   Assessment & Plan    VT VT storm EP had signed off his case 115/24 on PO amiodarone a couple days with stable rhythm Discussed turning his ICD back on though he did not want that NSTEMI type II in setting of VT Amiodarone gtt >> transitioned to PO Mexiletine added Volume management with HD  Overnight started to become hypotensive, requiring HF O2 Seems perhaps late/interrupted meds given he had been on BIPAP as well.   PCCM and hospitalist on board. Started to have episodes of WCT, reported as  asymptomatic,  Recalled today with recurrent sustained and nonsuatined VT (though slow) Overnight cards as well called and in their review by IM note tele with NSVTs and made no med changes from rhythm standpoint  He became more hypotensive this morning,> more frequent VTs, (slow 130's or so) and at time very broad.   AHF team at bedside and called for EP to weigh in Dr. Ladona Ridgel and myself to bedside Pressor started with some improvement in BP  Pt wakes and follows commands Plan to transition back to amiodarone gtt  Labs this morning/lytes looked OK  In d/w Dr. Shirlee Latch, no advanced therapy options Moving to ICU Prognosis is poor   For questions or updates, please contact [facility] Please consult [email or web address] for contact info under   Signed, Sheilah Pigeon, PA-C  , 11:39 AM    EP Attending  Patient well known to me. He is critically ill, in shock and has had recurrent VT, though now back in NSR. He awakens on command and has a bp of 84. Contin amiodarone. His prognosis is very poor.   Sharlot Gowda Michaelann Gunnoe,MD

## 2024-01-07 NOTE — Progress Notes (Addendum)
PROGRESS NOTE    ANTONIS ARREDONDO  WUJ:[id number] DOB: 02/21/1961 DOA: 12/10/2023 PCP: [name], MD  Brief Narrative:  This 63 year old African-American male, with past medical history significant for ESRD on HD TTS schedule, ischemic cardiomyopathy, congestive heart failure with reduced EF (25 to 30%), history of CAD, VT storm status post ICD, currently on Eliquis and amiodarone, reactive airway disease and hyperlipidemia presented with epigastric pain, shortness of breath, cough and chest pain.  Further workup in the ED showed pancreatic mass concerning for malignancy.  Patient was transferred to [facility] as patient will likely need endoscopic ultrasound guided biopsy of pancreatic mass.  GI is following him.   12/14/2023:  EUS guided biopsy of the pancreatic mass completed .  On heparin drip.  Nephrology on board for Hemodialysis needs.  1/17: He underwent repeat EUS guided biopsy of pancreatic mass, results are consistent with chronic pancreatitis.  GI  signed off.   1/21: Patient continued to remain hypotensive, has recurrent  VT, now requiring Levophed and amiodarone infusion.  Patient transferred to ICU.  Assessment & Plan:   Principal Problem:   Pancreatic mass Active Problems:   Combined congestive systolic and diastolic heart failure (HCC)   Hypotension   Hypokalemia   Elevated troponin   Chest pain   Reactive airway disease   ESRD (end stage renal disease) (HCC)   History of ventricular tachycardia s/p ICD   Abdominal pain   Gastritis and gastroduodenitis   Idiopathic acute pancreatitis without infection or necrosis   Pancreatic lesion   Abnormal CT of the abdomen   History of pancreatitis  Pancreatic mass: S/p EUS on 1/8 and 1/17. Biopsies done and results were consistent with chronic pancreatitis.   Previous biopsy findings consistent with chronic pancreatitis. GI  signed off.    Essential Hypertension: Blood pressure remains on the soft  side. Currently on midodrine 10 mg TID. Cardiology increased midodrine to 15 mg 3 times daily.   Chronic combined systolic and diastolic CHF No evidence of CHF exacerbation.  Fluid management with HD.    H/o ventricular tachycardia as well as ICD Continue with amiodarone 200 mg BID. Metoprolol 100 mg daily and mexiletine 300 mg daily on hold due to hypotension. . Continue with Eliquis.    Elevated troponin: Probably secondary to demand ischemia from recurrent VT Currently denies any chest pain.    Reactive airway disease: Continue with Breo Ellipta twice daily and Xopenex as needed. Of note COVID, flu, RSV negative. CTA chest unremarkable.  Acute hypoxic respiratory failure: Overnight oxygen requirement has gone up from 2 L to 6 L now with sats 90%. Patient denies any cough or shortness of breath,.  Continue supplemental oxygen and wean as tolerated   End-stage renal disease on dialysis with TTS schedule Nephrology on board.  Continue hemodialysis as per schedule   Obesity: Diet, exercise and weight reduction advised. Continue CPAP at night for OSA. Body mass index is 35.94 kg/m.   Constipation: Resolved.    Post ERCP pancreatitis: Slowly improving.  Liver enzymes are improved.  CT abd and pelvis on 1/13 showing pancreatitis.    Toxic metabolic encephalopathy: > Resolved. Unclear etiology, but suspect may be related to hypotension . CT head negative for acute pathology.  Mild leukocytosis.  Ammonia was normal   Anemia of chronic disease: Hemoglobin stable around 10.    Hyponatremia from ESRD.     Hypotension /VT storm: Patient continued to remain hypotensive, lethargic with recurrent VT on telemetry. Cardiology  started patient on Levophed and amiodarone infusion. Patient transferred to ICU.  Estimated body mass index is 35.94 kg/m as calculated from the following:   Height as of this encounter: 5\' 7"  (1.702 m).   Weight as of this encounter: 104.1  kg.   DVT prophylaxis: SCDs, Eliquis Code Status: Full code Family Communication: Mother at bedside Disposition Plan:   Status is: Inpatient Remains inpatient appropriate because: Patient is being transferred to ICU.   Consultants:  Nephrology Gastroenterology  Procedures: EGD / EUS  mass was identified in the pancreatic head. , repeat sampling was performed.     Antimicrobials:  Anti-infectives (From admission, onward)    Start     Dose/Rate Route Frequency Ordered Stop    1315  vancomycin (VANCOCIN) IVPB 1000 mg/200 mL premix       Placed in "Followed by" Linked Group   1,000 mg 200 mL/hr over 60 Minutes Intravenous  Once  1217      1315  vancomycin (VANCOCIN) IVPB 1000 mg/200 mL premix       Placed in "Followed by" Linked Group   1,000 mg 200 mL/hr over 60 Minutes Intravenous  Once  1217      1145  ceFEPIme (MAXIPIME) 2 g in sodium chloride 0.9 % 100 mL IVPB        2 g 200 mL/hr over 30 Minutes Intravenous  Once  1048      1130  ceFEPIme (MAXIPIME) 1 g in sodium chloride 0.9 % 100 mL IVPB  Status:  Discontinued        1 g 200 mL/hr over 30 Minutes Intravenous  Once  1033  1048    1115  vancomycin (VANCOREADY) IVPB 2000 mg/400 mL  Status:  Discontinued        2,000 mg 200 mL/hr over 120 Minutes Intravenous  Once  1027  1217      Subjective: Patient was seen and examined at bedside.  Overnight events noted. Patient remained confused but following commands.  He continued to remain hypotensive. Overnight patient oxygen requirement has gone up to 6 L now saturating 90%.   Objective: Vitals:    1042  1045  1100  1155  BP: (!) 86/69 (!) 83/65 92/73 (!) 114/54  Pulse: 77 77 99 78  Resp: (!) 27 (!) 24 (!) 31 (!) 29  Temp:      TempSrc:      SpO2: 94% 92% (!) 85%   Weight:      Height:       No intake or output data in the 24 hours  ending  1326  Filed Weights   12/25/23 0429 12/26/23 0647  0500  Weight: 104.1 kg 100.2 kg 102.9 kg    Examination:  General exam: Appears calm and comfortable, deconditioned, not in any acute distress. Respiratory system: Decreased breath sounds. Respiratory effort normal.  RR 18 Cardiovascular system: S1 & S2 heard, RRR. No JVD, murmurs, rubs, gallops or clicks. Gastrointestinal system: Abdomen is non distended, soft and non tender.  Normal bowel sounds heard. Central nervous system: Confused but arousable, no focal neurological deficits. Extremities: No edema, no cyanosis, no clubbing Skin: No rashes, lesions or ulcers Psychiatry: Not assessed.    Data Reviewed: I have personally reviewed following labs and imaging studies  CBC: Recent Labs  Lab 12/24/23 0706 12/25/23 0701 12/26/23 0858 12/26/23 1711  0320  1250  WBC 7.9 11.1* 13.2* 15.4* 15.4*  --   HGB 9.7* 10.4* 11.4* 10.6* 10.6* 10.9*  HCT 27.8* 29.9* 32.9* 30.6* 30.5* 32.0*  MCV 84.2 83.3 83.9 85.0 83.3  --   PLT 273 300 384 401* 385  --    Basic Metabolic Panel: Recent Labs  Lab 12/21/23 2343 12/22/23 1221 12/23/23 0756 12/24/23 0706 12/26/23 0858  0320  1250  NA 131* 132* 133* 132* 132* 134* 134*  K 4.4 4.5 4.3 4.1 4.5 5.1 6.2*  CL 95* 94* 95* 95* 93* 96*  --   CO2 24 23 24 23 24 22   --   GLUCOSE 86 79 81 102* 89 86  --   BUN 41* 49* 31* 43* 33* 40*  --   CREATININE 7.78* 9.04* 6.36* 7.80* 7.28* 8.31*  --   CALCIUM 9.0 8.9 8.5* 9.0 9.3 9.3  --   MG 2.1  --   --   --   --  2.1  --   PHOS 4.4 4.5  --   --   --  4.6  --    GFR: Estimated Creatinine Clearance: 10.8 mL/min (A) (by C-G formula based on SCr of 8.31 mg/dL (H)). Liver Function Tests: Recent Labs  Lab 12/20/23 1404 12/21/23 0640 12/22/23 1221 12/24/23 0706 12/26/23 0858  AST 305* 354*  --  197* 135*  ALT 237* 251*  --  178* 137*  ALKPHOS 189* 191*  --  166* 169*  BILITOT 1.8* 2.5*   --  2.5* 3.3*  PROT 5.7* 6.1*  --  5.7* 6.4*  ALBUMIN 1.7* 1.8* 1.6* 1.6* 1.7*   No results for input(s): "LIPASE", "AMYLASE" in the last 168 hours. No results for input(s): "AMMONIA" in the last 168 hours.  Coagulation Profile: No results for input(s): "INR", "PROTIME" in the last 168 hours. Cardiac Enzymes: No results for input(s): "CKTOTAL", "CKMB", "CKMBINDEX", "TROPONINI" in the last 168 hours. BNP (last 3 results) No results for input(s): "PROBNP" in the last 8760 hours. HbA1C: No results for input(s): "HGBA1C" in the last 72 hours. CBG: Recent Labs  Lab 12/26/23 2022  1100  GLUCAP 79 92   Lipid Profile: No results for input(s): "CHOL", "HDL", "LDLCALC", "TRIG", "CHOLHDL", "LDLDIRECT" in the last 72 hours. Thyroid Function Tests: No results for input(s): "TSH", "T4TOTAL", "FREET4", "T3FREE", "THYROIDAB" in the last 72 hours. Anemia Panel: No results for input(s): "VITAMINB12", "FOLATE", "FERRITIN", "TIBC", "IRON", "RETICCTPCT" in the last 72 hours. Sepsis Labs: Recent Labs  Lab 12/26/23 1829  0954  LATICACIDVEN 1.4 1.4    Recent Results (from the past 240 hours)  Culture, blood (Routine X 2) w Reflex to ID Panel     Status: None   Collection Time: 12/20/23 10:38 PM   Specimen: BLOOD  Result Value Ref Range Status   Specimen Description BLOOD SITE NOT SPECIFIED  Final   Special Requests   Final    BOTTLES DRAWN AEROBIC AND ANAEROBIC Blood Culture results may not be optimal due to an inadequate volume of blood received in culture bottles   Culture   Final    NO GROWTH 5 DAYS Performed at [facility], [address] [place], [place] [address]    Report Status 12/25/2023 FINAL  Final  Culture, blood (Routine X 2) w Reflex to ID Panel     Status: None   Collection Time: 12/20/23 10:40 PM   Specimen: BLOOD  Result Value Ref Range Status   Specimen Description BLOOD SITE NOT SPECIFIED  Final   Special Requests   Final    BOTTLES DRAWN  AEROBIC AND ANAEROBIC Blood Culture  results may not be optimal due to an inadequate volume of blood received in culture bottles   Culture   Final    NO GROWTH 5 DAYS Performed at [facility], [address] [place], [place] [address]    Report Status 12/25/2023 FINAL  Final    Radiology Studies: DG CHEST PORT 1 VIEW Result Date: 12/26/2023 CLINICAL DATA:  Shortness of breath. EXAM: PORTABLE CHEST 1 VIEW COMPARISON:  December 10, 2023. FINDINGS: Mild cardiomegaly is noted with central pulmonary vascular congestion. Probable bilateral pulmonary edema is noted with small pleural effusions. Bony thorax is unremarkable. IMPRESSION: Mild cardiomegaly with mild central pulmonary vascular congestion and probable bilateral pulmonary edema and small pleural effusions. Electronically Signed   By: Lupita Raider M.D.   On: 12/26/2023 10:53    Scheduled Meds:  apixaban  5 mg Oral BID   Chlorhexidine Gluconate Cloth  6 each Topical Q0600   docusate  100 mg Per Tube BID   EPINEPHrine       EPINEPHrine NaCl       fluticasone furoate-vilanterol  1 puff Inhalation Daily   hydrOXYzine  25 mg Oral TID   lanthanum  2,000 mg Oral TID WC   mexiletine  300 mg Per Tube BID   midazolam       midodrine  15 mg Oral TID WC   pantoprazole (PROTONIX) IV  40 mg Intravenous Q12H   polyethylene glycol  17 g Per Tube Daily   rocuronium       sodium bicarbonate       sodium bicarbonate       sodium bicarbonate       Continuous Infusions:  sodium chloride     albumin human     amiodarone 30 mg/hr ( 1130)   ceFEPime (MAXIPIME) IV     epinephrine 2 mcg/min ( 1307)   fentaNYL infusion INTRAVENOUS 25 mcg/hr ( 1200)   midazolam     norepinephrine (LEVOPHED) Adult infusion 30 mcg/min ( 1200)   prismasol BGK 2/2.5 dialysis solution     prismasol BGK 2/2.5 replacement solution     prismasol BGK 2/2.5 replacement solution     vancomycin     Followed by   vancomycin      vasopressin 0.04 Units/min ( 1200)     LOS: 17 days    Time spent: 50 mins    Willeen Niece, MD [facility]   If 7PM-7AM, please contact night-coverage

## 2024-01-07 NOTE — Procedures (Signed)
Intubation Procedure Note  UVALDO PARLETTE  [id number]  February 23, 1961  Date:  Time:4:42 PM   Provider Performing:Jennavieve Arrick Wilford Grist    Procedure: Intubation (31500)  Indication(s) Respiratory Failure  Consent Unable to obtain consent due to emergent nature of procedure.   Anesthesia Etomidate and Rocuronium   Time Out Verified patient identification, verified procedure, site/side was marked, verified correct patient position, special equipment/implants available, medications/allergies/relevant history reviewed, required imaging and test results available.   Sterile Technique Usual hand hygeine, masks, and gloves were used   Procedure Description Patient positioned in bed supine.  Sedation given as noted above.  Patient was intubated with endotracheal tube using Glidescope.  View was Grade 1 full glottis .  Number of attempts was 1.  Colorimetric CO2 detector was consistent with tracheal placement.   Complications/Tolerance None; patient tolerated the procedure well. Chest X-ray is ordered to verify placement.   EBL Minimal   Specimen(s) None

## 2024-01-07 NOTE — Progress Notes (Signed)
I arrived to bedside following one of his cardiac arrests. He remained in shock, and very bradycardic. PCCM and HF were evaluating and managing the pt when he lost pulse again.   I spoke with family -- fiance in waiting room + pts mother on speaker phone regarding code status. There was some confusion regarding why his code status is full code (previously dnr/I, pt reversed this days ago). As we were concluding our discussion to change his code status to DNR, we received news that ongoing resuscitative efforts were terminated and the patient had died.   Mother is working on getting a ride to the hospital Fiance in family room and appreciative of all efforts, will be visited by chaplain shortly.    Tessie Fass MSN, AGACNP-BC [facility] , 1:33 PM

## 2024-01-07 NOTE — Progress Notes (Signed)
Late Note Entry- Jan. 22, 2025  Contacted [facility] this morning to be advised of pt's passing yesterday.   Olivia Canter Renal Navigator [phone]

## 2024-01-07 NOTE — Progress Notes (Signed)
Called by CCMD pt had about 1 minute long episode of Vtach. Pt asymptomatic, BP 87/61, Temp 98.3, RR 30, SpO2 98% on Bipap. Loney Loh, MD notified.

## 2024-01-07 NOTE — Progress Notes (Addendum)
Informed by RN that patient has having frequent runs of V. tach on telemetry.  He is asymptomatic.  Blood pressure remains low with systolic in the 80s.  Patient was seen by critical care tonight and given midodrine for his hypotension, see note.  Patient with history of VT storm status post ICD. He is on amiodarone 200 mg twice daily and mexiletine 300 mg twice daily.  His home Coreg is on hold due to hypotension.  Potassium and magnesium have been normal on labs.  TSH was previously normal on labs 2 months ago.  I spoke to Dr. Geraldo Pitter with cardiology who reviewed the patient's telemetry and saw only a few brief episodes of NSVT, no changes to medication regimen recommended at this time.  Appreciate cardiology help.  Addendum/update: Notified by RN that telemetry called this morning to inform her that the patient had another episode of V. tach which lasted 1 minute.  Per RN, patient remained asymptomatic and she did not witness his ICD shocking him.  Blood pressure 87/61.  I have spoken to Dr. Scharlene Gloss, cardiology will formally consult.

## 2024-01-07 NOTE — Consult Note (Addendum)
Advanced Heart Failure Team Consult Note   Primary Physician: [name], MD Cardiologist:  Marca Ancona, MD  Reason for Consultation: Acute on Chronic Systolic Heart Failure  HPI:    Victor Thompson is seen today for evaluation of acute on chronic systolic heart failure at the request of Dr. Alverda Skeans.   Victor Thompson is a 63 y.o. male with history of CAD, ischemic CMP, and ESRD.  He had PCI in 2005, uncertain what vessel was involved.  He has not had coronary angiography since that time due to advanced CKD.  He has had ischemic cardiomyopathy x years.  Echo in 2/22 showed EF 25-30% with moderate LV dilation and normal RV.  He had VT arrest in 2015, has St Jude subcutaneous ICD.  [name] in 2/22.  LHC/RHC was done in 8/22, showing no significant CAD and stable hemodynamics.    Echo 11/22 EF 25-30%, moderate LV dilation, diffuse hypokinesis, normal RV.    Patient had ICD shock in 6/23 while at HD => VT storm.  He was noted to be volume overloaded and started on amiodarone.     Admitted 9/23 with VT storm. Required intubation with multiple amiodarone boluses. Echo showed EF 20-25%, GIIIDD (restrictive), RV normal. R/LHC showed nonobstructive CAD and elevated right and left filling pressures. Underwent CRRT for volume removal. EP consulted and started on procainamide. Started on prednisone for suspected sarcoid. Mexiletine later added for VT suppression. Able to wean pressors off and transition to iHD for volume removal. Decided on DNR, and ICD deactivated.  Hospitalization c/b PNA and paroxysmal atrial fibrillation. Planned to have cardiac PET at [facility] for cardiac sarcoidosis work up but insurance denied.  Echo 12/11/23: EF 30-35%, G1DD, mild MR  Presented 12/10/23 with abdominal pain and found to have pancreatic head mass by CT scan. EUS with biopsy 1/17 returned negative for malignancy, but positive for acute and chronic pancreatitis with necrosis. Follow up MRCP recommended  but refused. Admission has been complicated by recurrence of VT storm (ICD therapies previously turned off at patient request), hypotension, and confusion. EP consulted 1/14 and recommended continuing heparin gtt, amio, toprol XL, and mexiletine. Volume has been managed per nephrology and iHD on TTS schedule, however ultrafiltration and VT management has been complicated by hypotension, now on Midodrine up to 15 mg tid. Last iHD session 12/24/23 with minimal UF of 400cc. On 12/26/23 developed shortness of breath and somnolence rapid response was called and he was placed on BiPAP. Critical care consulted and thought not to be appropriate for the unit at the time. This morning developed VT lasting up to 1 minute with hypotension. AHF consulted.   Today on evaluation patient remains hypotensive SBP 70-90s with intermittent runs of VT with somnolence. Unable to follow commands. Answering yes/no questions only with repeated questioning. Appeared dyspnea on 6L . Sister at the bedside and reported that shortness of breath and somnolence has been occurring intermittently over the last 2-3 days, although worse this morning. LA, ABG, procal ordered. MD called to discuss ICU transfer.   On continued exam with MD, patient began to decompensate further. SBP 50-60s with long runs of VT. Patient was placed in trendelenburg and peripheral levophed started at 6>12, with increase of BP to SBP 80s. He was placed on Zoll monitoring and emergently transported to ICU with MD, NP, PharmD, and RN.  Of note, Palliative Care discussion notes 1/14 resulting in DNR/DNI from patient and family, however was reversed to Full Code  12/21/23. Confirmed with patient at the bedside: Full Code.    Home Medications Prior to Admission medications   Medication Sig Start Date End Date Taking? Authorizing Provider  albuterol (PROVENTIL) (2.5 MG/3ML) 0.083% nebulizer solution INHALE THE CONTENTS OF 1 VIAL BY MOUTH EVERY 6 HOURS AS NEEDED 10/04/23   Yes [name], MD  albuterol (VENTOLIN HFA) 108 (90 Base) MCG/ACT inhaler INHALE TWO PUFFS BY MOUTH EVERY 4 HOURS AS NEEDED 04/14/23  Yes [name], MD  ALLERGY RELIEF CETIRIZINE 10 MG tablet Take 10 mg by mouth 2 (two) times daily. 08/23/23  Yes [provider]  AMBULATORY NON FORMULARY MEDICATION Medication Name: diltiazem 2% / lidocaine 5% ointment - pea sized amount PR TID 09/28/23  Yes Armbruster, Willaim Rayas, MD  apixaban (ELIQUIS) 5 MG TABS tablet NEW PRESCRIPTION REQUEST: Eliquis 5 Mg - TAKE ONE TABLET BY MOUTH TWICE DAILY 10/03/23  Yes Laurey Morale, MD  atorvastatin (LIPITOR) 80 MG tablet Take 1 tablet (80 mg total) by mouth at bedtime. 11/08/23 11/07/24 Yes Laurey Morale, MD  Cholecalciferol 125 MCG (5000 UT) TABS Take 1 tablet (5,000 Units total) by mouth daily. 12/01/22  Yes [name], MD  docusate sodium (COLACE) 100 MG capsule TAKE ONE CAPSULE BY MOUTH DAILY 07/27/23  Yes [name], MD  EPINEPHrine 0.3 mg/0.3 mL IJ SOAJ injection Inject 0.3 mg into the muscle once as needed for up to 2 doses (if worsening tongue swelling, SOB, hypoxia, or other concerns for progressive anaphylaxis). 12/01/22  Yes [name], MD  ethyl chloride spray SPRAY A SMALL AMOUNT THREE TIMES A WEEK JUST PRIOR TO NEEDLE INSERTION 06/27/23  Yes [name], MD  fluticasone [facility]) 50 MCG/ACT nasal spray INSTILL 1 SPRAY IN EACH NOTRIL DAILY AS NEEDED 10/04/23  Yes [name], MD  hydrOXYzine (ATARAX) 25 MG tablet Take 3 tablets (75 mg total) by mouth at bedtime. 06/27/23  Yes [name], MD  isosorbide-hydrALAZINE (BIDIL) 20-37.5 MG tablet TAKE 1/2 TABLET BY MOUTH THREE TIMES DAILY ON MONDAY, WEDNESDAY, FRIDAY AND SUNDAY 12/08/23  Yes Milford, Anderson Malta, FNP  lanthanum (FOSRENOL) 1000 MG chewable tablet Chew 2 tablets (2,000 mg total) by mouth 3 (three) times daily AND 1 tablet (1,000 mg total) 2 (two) times daily as needed with snacks 06/14/23  Yes  Anthony Sar, MD  linaclotide [facility]) 72 MCG capsule TAKE ONE CAPSULE BY MOUTH ON MONDAY, WEDNESDAY, FRIDAY AND SATURDAY 10/19/23  Yes [name], MD  mexiletine (MEXITIL) 150 MG capsule Take 2 capsules (300 mg total) by mouth 2 (two) times daily. 10/03/23  Yes Laurey Morale, MD  multivitamin (RENA-VIT) TABS tablet Take 1 tablet by mouth daily. 08/23/23  Yes [provider]  ondansetron (ZOFRAN) 4 MG tablet Take 1 tablet (4 mg total) by mouth 3 (three) times daily before meals. 12/01/23  Yes Unk Lightning, PA  pantoprazole (PROTONIX) 40 MG tablet Take 1 tablet (40 mg total) by mouth 2 (two) times daily before a meal. 12/01/23  Yes Unk Lightning, [place]  Polyethylene Glycol 3350 (PEG 3350) 17 g PACK NEW PRESCRIPTION REQUEST: Polyethylene Glycol 3350- 1 PACKET BY MOUTH DAILY AS NEEDED 12/01/22  Yes [name], MD  tadalafil (CIALIS) 10 MG tablet Take 1 tablet (10 mg total) by mouth daily as needed 30 min prior to sexual activity.  Do not take with Imdur (isosorbide) or nitroglycerin 02/22/23  Yes   amiodarone (PACERONE) 200 MG tablet TAKE ONE (1) TABLET  BY MOUTH TWICE DAILY 12/19/23   Laurey Morale, MD  budesonide-formoterol [facility]) 80-4.5 MCG/ACT inhaler INHALE TWO PUFFS BY MOUTH TWICE DAILY 12/23/23   [name], MD  carvedilol (COREG) 6.25 MG tablet TAKE ONE TABLET BY MOUTH TWICE DAILY 12/19/23   Laurey Morale, MD  doxycycline (VIBRA-TABS) 100 MG tablet Take 1 tablet (100 mg total) by mouth 2 (two) times daily as directed Patient not taking: Reported on 12/11/2023 11/15/23       Past Medical History: Past Medical History:  Diagnosis Date   Acute on chronic systolic CHF (congestive heart failure) (HCC) 06/14/2020   Acute respiratory failure (HCC) 05/28/2022   AICD (automatic cardioverter/defibrillator) present    2015   Allergy    Asthma    no meds   Cardiac arrest (HCC) 2015   Chest pain 05/25/2022   CHF (congestive heart failure)  (HCC)    Coronary artery disease    ESRD on hemodialysis (HCC)    ckd -stage 5   Hyperlipidemia    Hypertension    Myocardial infarction (HCC)    Obesity    Pneumonia    Shortness of breath dyspnea    Sleep apnea    USES CPAP   Wears glasses     Past Surgical History: Past Surgical History:  Procedure Laterality Date   AV FISTULA PLACEMENT Right 03/15/2019   Procedure: RIGHT ARM ARTERIOVENOUS (AV) FISTULA CREATION;  Surgeon: Chuck Hint, MD;  Location: [facility] OR;  Service: Vascular;  Laterality: Right;   BIOPSY  12/14/2023   Procedure: BIOPSY;  Surgeon: Lemar Lofty., MD;  Location: [facility] ENDOSCOPY;  Service: Gastroenterology;;   COLONOSCOPY     COLONOSCOPY W/ BIOPSIES AND POLYPECTOMY     COLONOSCOPY WITH PROPOFOL N/A 09/28/2023   Procedure: COLONOSCOPY WITH PROPOFOL;  Surgeon: Benancio Deeds, MD;  Location: [facility] ENDOSCOPY;  Service: Gastroenterology;  Laterality: N/A;   CORONARY STENT PLACEMENT  2007   ESOPHAGOGASTRODUODENOSCOPY (EGD) WITH PROPOFOL N/A 12/14/2023   Procedure: ESOPHAGOGASTRODUODENOSCOPY (EGD) WITH PROPOFOL;  Surgeon: Meridee Score Netty Starring., MD;  Location: [facility] ENDOSCOPY;  Service: Gastroenterology;  Laterality: N/A;   ESOPHAGOGASTRODUODENOSCOPY (EGD) WITH PROPOFOL N/A 12/23/2023   Procedure: ESOPHAGOGASTRODUODENOSCOPY (EGD) WITH PROPOFOL;  Surgeon: Meridee Score Netty Starring., MD;  Location: [facility] ENDOSCOPY;  Service: Gastroenterology;  Laterality: N/A;   FINE NEEDLE ASPIRATION  12/14/2023   Procedure: FINE NEEDLE ASPIRATION (FNA) LINEAR;  Surgeon: Lemar Lofty., MD;  Location: [facility] ENDOSCOPY;  Service: Gastroenterology;;   FINE NEEDLE ASPIRATION  12/23/2023   Procedure: FINE NEEDLE ASPIRATION (FNA) LINEAR;  Surgeon: Lemar Lofty., MD;  Location: [facility] ENDOSCOPY;  Service: Gastroenterology;;   HEMOSTASIS CLIP PLACEMENT  12/14/2023   Procedure: HEMOSTASIS CLIP PLACEMENT;  Surgeon: Lemar Lofty., MD;  Location: [facility] ENDOSCOPY;  Service:  Gastroenterology;;   IMPLANTABLE CARDIOVERTER DEFIBRILLATOR IMPLANT  2015   POLYPECTOMY  09/28/2023   Procedure: POLYPECTOMY;  Surgeon: Benancio Deeds, MD;  Location: [facility] ENDOSCOPY;  Service: Gastroenterology;;   POLYPECTOMY  12/14/2023   Procedure: POLYPECTOMY;  Surgeon: Lemar Lofty., MD;  Location: [facility] ENDOSCOPY;  Service: Gastroenterology;;   RIGHT/LEFT HEART CATH AND CORONARY ANGIOGRAPHY N/A 07/08/2021   Procedure: RIGHT/LEFT HEART CATH AND CORONARY ANGIOGRAPHY;  Surgeon: Laurey Morale, MD;  Location: [facility] INVASIVE CV LAB;  Service: Cardiovascular;  Laterality: N/A;   RIGHT/LEFT HEART CATH AND CORONARY ANGIOGRAPHY N/A 08/25/2022   Procedure: RIGHT/LEFT HEART CATH AND CORONARY ANGIOGRAPHY;  Surgeon: Laurey Morale, MD;  Location: [facility] INVASIVE CV LAB;  Service: Cardiovascular;  Laterality: N/A;   SUBQ ICD CHANGEOUT N/A 10/13/2020   Procedure: SUBQ ICD CHANGEOUT;  Surgeon: Marinus Maw, MD;  Location: [facility] INVASIVE CV LAB;  Service: Cardiovascular;  Laterality: N/A;   UPPER ESOPHAGEAL ENDOSCOPIC ULTRASOUND (EUS) N/A 12/14/2023   Procedure: UPPER ESOPHAGEAL ENDOSCOPIC ULTRASOUND (EUS);  Surgeon: Lemar Lofty., MD;  Location: [facility] ENDOSCOPY;  Service: Gastroenterology;  Laterality: N/A;   UPPER ESOPHAGEAL ENDOSCOPIC ULTRASOUND (EUS) N/A 12/23/2023   Procedure: UPPER ESOPHAGEAL ENDOSCOPIC ULTRASOUND (EUS);  Surgeon: Lemar Lofty., MD;  Location: [facility] ENDOSCOPY;  Service: Gastroenterology;  Laterality: N/A;    Family History: Family History  Problem Relation Age of Onset   Hypertension Mother    Liver disease Father    Colon cancer Neg Hx    Esophageal cancer Neg Hx    Rectal cancer Neg Hx    Stomach cancer Neg Hx    Colon polyps Neg Hx     Social History: Social History   Socioeconomic History   Marital status: Single    Spouse name: Not on file   Number of children: 3   Years of education: Not on file   Highest education level: Not on file  Occupational  History   Occupation: retired  Tobacco Use   Smoking status: Never   Smokeless tobacco: Never  Vaping Use   Vaping status: Never Used  Substance and Sexual Activity   Alcohol use: No   Drug use: No   Sexual activity: Yes  Other Topics Concern   Not on file  Social History Narrative   ** Merged History Encounter **       Social Drivers of Health   Financial Resource Strain: Low Risk  (12/09/2023)   Overall Financial Resource Strain (CARDIA)    Difficulty of Paying Living Expenses: Not hard at all  Food Insecurity: No Food Insecurity (12/11/2023)   Hunger Vital Sign    Worried About Running Out of Food in the Last Year: Never true    Ran Out of Food in the Last Year: Never true  Transportation Needs: No Transportation Needs (12/11/2023)   PRAPARE - Administrator, Civil Service (Medical): No    Lack of Transportation (Non-Medical): No  Physical Activity: Inactive (02/17/2023)   Exercise Vital Sign    Days of Exercise per Week: 0 days    Minutes of Exercise per Session: 0 min  Stress: No Stress Concern Present (02/17/2023)   [employer] of Occupational Health - Occupational Stress Questionnaire    Feeling of Stress : Only a little  Social Connections: Not on file   Allergies:  Allergies  Allergen Reactions   Ace Inhibitors Swelling    Swelling of the tongue   Influenza Vaccines Hives and Swelling    SWELLING REACTION UNSPECIFIED    Ketorolac Swelling    SWELLING REACTION UNSPECIFIED    Lidocaine Swelling    TONGUE SWELLS   Lisinopril Swelling    TONGUE SWELLING Pt reported problem with a BP med which sounded like  lisinopril  But as of 09/19/06,pt had tolerated altace without problem   Penicillins Swelling    TONGUE SWELLS Has patient had a PCN reaction causing immediate rash, facial/tongue/throat swelling, SOB or lightheadedness with hypotension: Yes Has patient had a PCN reaction causing severe rash involving mucus membranes or skin necrosis: No Has  patient had a PCN reaction that required hospitalization No Has patient had a PCN reaction occurring within the last 10 years: Yes If all of the above  answers are "NO", then may proceed with Cephalosporin use.     Objective:    Vital Signs:   Temp:  [97.6 F (36.4 C)-99.3 F (37.4 C)] 97.6 F (36.4 C) (01/21 0808) Pulse Rate:  [70-122] 86 (01/21 0808) Resp:  [18-31] 31 (01/21 0808) BP: (66-123)/(33-101) 92/45 (01/21 0808) SpO2:  [54 %-100 %] 95 % (01/21 0808) FiO2 (%):  [40 %] 40 % (01/20 1915) Weight:  [102.9 kg] 102.9 kg (01/21 0500) Last BM Date : 12/25/23  Weight change: Filed Weights   12/25/23 0429 12/26/23 0647  0500  Weight: 104.1 kg 100.2 kg 102.9 kg    Intake/Output:  No intake or output data in the 24 hours ending  0911    Physical Exam    General: Critically ill appearing. Severely weak. Dyspneic on [place] HEENT: neck supple.   Cardiac: JVP ~10cm. S1 and S2 present. No murmurs or rub. Resp: Diminished lung sounds, low lungs volumes Abdomen: Soft, non-tender, non-distended. + BS. RUE fistula Extremities: Cool. No rash, cyanosis. Trace peripheral edema.  Neuro: Somnolent. Unable to follow commands.  Telemetry   SR with wide QRS in 80 with intermittent runs VT (personally reviewed)  EKG    No new EKG  Labs   Basic Metabolic Panel: Recent Labs  Lab 12/21/23 2343 12/22/23 1221 12/23/23 0756 12/24/23 0706 12/26/23 0858  0320  NA 131* 132* 133* 132* 132* 134*  K 4.4 4.5 4.3 4.1 4.5 5.1  CL 95* 94* 95* 95* 93* 96*  CO2 24 23 24 23 24 22   GLUCOSE 86 79 81 102* 89 86  BUN 41* 49* 31* 43* 33* 40*  CREATININE 7.78* 9.04* 6.36* 7.80* 7.28* 8.31*  CALCIUM 9.0 8.9 8.5* 9.0 9.3 9.3  MG 2.1  --   --   --   --  2.1  PHOS 4.4 4.5  --   --   --  4.6    Liver Function Tests: Recent Labs  Lab 12/20/23 1404 12/21/23 0640 12/22/23 1221 12/24/23 0706 12/26/23 0858  AST 305* 354*  --  197* 135*  ALT 237* 251*  --  178* 137*   ALKPHOS 189* 191*  --  166* 169*  BILITOT 1.8* 2.5*  --  2.5* 3.3*  PROT 5.7* 6.1*  --  5.7* 6.4*  ALBUMIN 1.7* 1.8* 1.6* 1.6* 1.7*   No results for input(s): "LIPASE", "AMYLASE" in the last 168 hours. No results for input(s): "AMMONIA" in the last 168 hours.  CBC: Recent Labs  Lab 12/24/23 0706 12/25/23 0701 12/26/23 0858 12/26/23 1711  0320  WBC 7.9 11.1* 13.2* 15.4* 15.4*  HGB 9.7* 10.4* 11.4* 10.6* 10.6*  HCT 27.8* 29.9* 32.9* 30.6* 30.5*  MCV 84.2 83.3 83.9 85.0 83.3  PLT 273 300 384 401* 385    Cardiac Enzymes: No results for input(s): "CKTOTAL", "CKMB", "CKMBINDEX", "TROPONINI" in the last 168 hours.  BNP: BNP (last 3 results) Recent Labs    11/14/23 2129 12/10/23 1125 12/26/23 1711  BNP 1,094.0* 368.3* 450.0*    ProBNP (last 3 results) No results for input(s): "PROBNP" in the last 8760 hours.   CBG: Recent Labs  Lab 12/26/23 2022  GLUCAP 79    Coagulation Studies: No results for input(s): "LABPROT", "INR" in the last 72 hours.   Imaging   DG CHEST PORT 1 VIEW Result Date: 12/26/2023 CLINICAL DATA:  Shortness of breath. EXAM: PORTABLE CHEST 1 VIEW COMPARISON:  December 10, 2023. FINDINGS: Mild cardiomegaly is noted with central pulmonary vascular congestion. Probable bilateral  pulmonary edema is noted with small pleural effusions. Bony thorax is unremarkable. IMPRESSION: Mild cardiomegaly with mild central pulmonary vascular congestion and probable bilateral pulmonary edema and small pleural effusions. Electronically Signed   By: Lupita Raider M.D.   On: 12/26/2023 10:53     Medications:     Current Medications:  amiodarone  200 mg Oral BID   apixaban  5 mg Oral BID   Chlorhexidine Gluconate Cloth  6 each Topical Q0600   docusate sodium  100 mg Oral BID   fluticasone furoate-vilanterol  1 puff Inhalation Daily   hydrOXYzine  25 mg Oral TID   lanthanum  2,000 mg Oral TID WC   mexiletine  300 mg Oral BID   midodrine  15 mg Oral TID  WC   pantoprazole  40 mg Oral BID   polyethylene glycol  17 g Oral BID    Infusions:  albumin human        Patient Profile   Victor Thompson is a 63 y.o. male with a past medical history notable for CAD (PCI 2007), chronic systolic heart failure secondary to ICM w/hx of VT arrest and ICD (at [facility] in [facility]), recurrent ventricular tachycardia, dismal atrial fibrillation, HTN, HLD, ESRD on HD who is currently admitted with pancreatitis.   Assessment/Plan   1. VT storm: Has has a Science writer ICD.  Admission in 9/23 with refractory monomorphic VT. Multiple shocks.  Later alternating NSR with a slow wide complex AIVR-like rhythm. Recurrent monomorphic VT that same admission w/ repeated shocks>>re-intubated, rebolused with amio, quinidine discontinued, started on procainamide.  Given concern for possible cardiac sarcoid, started empirically on prednisone. HiRes Chest CT this admit with no evidence of pulmonary sarcoid. Cath this admit with nonobstructive CAD. Echo 9/23 with EF 20-25%, GIIIDD (restrictive), RV normal. Recurrent VT on 09/07/22 with ICD shock. - In and out of VT on exam in the setting of severe hypotension with pulse. Peripheral levo started at the bedside 6>12. Patient emergently transferred to ICU. - Start amio bolus and gtt for VT - Hold GDMT and beta blocker with hypotension - Appears volume up on exam. UF at HD has been limited by hypotension. Last session 400cc on 1/18. Needs volume removal, but too unstable for iHD. Will likely need CRRT with BP pressure support. - Coreg held for hypotension (prev 12.5 mg bid, hold before HD) - EP previously decided against VT ablation, do not think it would be likely to be successful and very high risk. ICD is now de-activated.  - Of note, h/o recent concern from optometry appointment recently of ocular deposition of amiodarone.  - Previously on empiric prednisone for possible cardiac sarcoidosis, now off. He still has not had  cardiac PET as insurance has denied this x 2. Had been scheduled at [facility] as OP, however he was hospitalized at time of scan. 2. Chronic systolic CHF: H/o NICM. St Jude subcutaneous ICD.  Echo (9/23) with EF 20-25%, normal RV function, mild-moderate MR, IVC dilated.  In 9/23, unable to complete cardiac MRI due to artifact from [facility] ICD.  PYP scan was negative. He has a wide LBBB but has subcutaneous ICD and questionable benefit to change from [facility] ICD to BiV device in dialysis patient.  - Echo 1/25: EF 30-35%, G1DD, mild MR - Hold GDMT with hypotension - Needs volume removal once stable. Will need HD line for CRRT.  - Continue Levo for BP support 3. CAD: ?PCI in 2005, unsure what vessel.  Coronary angiography in 8/22 showed no significant CAD and also does not appear to show prior stent so h/o CAD is questionable. LHC in 9/23 with nonobstructive CAD. No chest pain. - Continue statin - No ASA with anticoagulation.  4. ESRD: Since 2/22. TTS HD.  - started on midodrine 15 mg tid - Last HD session 1/18 with 400cc UF due to hypotension. Too unstable for iHD will need line for CRRT. 5. Atrial fibrillation: Paroxysmal.   - Amio as above. Concern for ocular deposition of amiodarone, will speak with EP about lowering dose.  - On Eliquis. 6. Necrotizing pancreatitis: admit for abd pain. EUS + for pancreatic head mass, biopsy negative for malignancy but notable for acute and chronic inflammation and necrosis. Now with elevated liver enzymes. - GI following 7. Leukocytosis - WBC 11>13>15.4, afebrile. With AMS, hypotension, and increasing O2 requirement. Would start empiric antibiotics with Cefepime.  - LA 1.4, procal pending (with renal function would expect to be more elevated)  Length of Stay: 82  [place] Lee, NP  , 9:11 AM  Advanced Heart Failure Team Pager [phone] (M-F; 7a - 5p)  Please contact [facility] for night-coverage after hours (4p -7a ) and weekends on [email or web address]  Patient seen  with NP, agree with the above note.   History as noted above.  Patient has a history of end-stage CHF with refractory VT on mexiletine and amiodarone.  He has ESRD.  Patient was admitted with abdominal pain and found to have chronic necrotizing pancreatitis.   Patient was hypotensive last night, started on midodrine.  Noted to have short runs of relatively slow VT.  Cardiology called to see patient this morning, on my arrival, he was lethargic but able to answer questions.  SBP 60s-70s.    General: Lethargic.  Neck: JVP 10 cm, no thyromegaly or thyroid nodule.  Lungs: Clear to auscultation bilaterally with normal respiratory effort. CV: Nondisplaced PMI.  Heart regular S1/S2, no S3/S4, no murmur.  1+ ankle edema.   Abdomen: Soft, nontender, no hepatosplenomegaly, no distention.  Skin: Intact without lesions or rashes.  Neurologic: Sleeping but awakens.  Extremities: No clubbing or cyanosis.  HEENT: Normal.   Patient was initially started on peripheral norepinephrine and transferred to the MICU.  We initiated broad spectrum antibiotics with vancomycin/cefepime.  He had runs of slow VT frequently.  Amiodarone was infused IV. On arrival to MICU,  dialysis catheter and arterial line placed.  NE continued, vasopressin added. Patient intially stabilized.  He was thought most likely to have septic/distributive shock superimposed on baseline cardiomyopathy.  However, patient's BP subsequently deteriorated.  He was started on epinephrine gtt and received HCO3.  Ultimately, he developed PEA with a slow, wide complex rhythm.  He had CPR with multiple rounds of epinephrine and HCO3. Ultimately, he developed asystole and expired.    Patient's sister was present in the ICD and updated.   CRITICAL CARE Performed by: Marca Ancona  Total critical care time: 70 minutes  Critical care time was exclusive of separately billable procedures and treating other patients.  Critical care was necessary to treat or  prevent imminent or life-threatening deterioration.  Critical care was time spent personally by me on the following activities: development of treatment plan with patient and/or surrogate as well as nursing, discussions with consultants, evaluation of patient's response to treatment, examination of patient, obtaining history from patient or surrogate, ordering and performing treatments and interventions, ordering and review of laboratory studies, ordering and review of radiographic studies, pulse  oximetry and re-evaluation of patient's condition.  Marca Ancona  1:59 PM

## 2024-01-07 NOTE — Progress Notes (Signed)
Pt had multiple occurences of Vtach, longest one was 10 beats. Loney Loh, MD notified.  No changes on assessment, pt condition unchanged from previous.

## 2024-01-07 NOTE — Progress Notes (Signed)
VAST received code blue page. Upon arrival, VAST RN informed that appropriate access in place.

## 2024-01-07 NOTE — Progress Notes (Signed)
Pt was noted to have vtach last night. However, this morning it is frequent and patient unable to take PO meds. He is lethargic and bp currently 92/45 but unable to take midodrine. Will notify MD. Pt resting with call bell within reach.  Will continue to monitor. Wife at bedside.

## 2024-01-07 NOTE — Procedures (Signed)
Arterial Catheter Insertion Procedure Note  Victor Thompson  [id number]  16-Nov-1961  Date:  Time:3:45 PM    Provider Performing: Lynnell Catalan    Procedure: Insertion of Arterial Line (40981) with US guidance (19147)   Indication(s) Blood pressure monitoring and/or need for frequent ABGs  Consent Unable to obtain consent due to emergent nature of procedure.  Anesthesia None   Time Out Verified patient identification, verified procedure, site/side was marked, verified correct patient position, special equipment/implants available, medications/allergies/relevant history reviewed, required imaging and test results available.   Sterile Technique Maximal sterile technique including full sterile barrier drape, hand hygiene, sterile gown, sterile gloves, mask, hair covering, sterile ultrasound probe cover (if used).   Procedure Description Area of catheter insertion was cleaned with chlorhexidine and draped in sterile fashion. With real-time ultrasound guidance an arterial catheter was placed into the right femoral artery.  Appropriate arterial tracings confirmed on monitor.     Complications/Tolerance None; patient tolerated the procedure well.   EBL Minimal   Specimen(s) None  Lynnell Catalan, MD [facility] ICU Physician [facility] [place] [facility]  Pager: [phone] Or Epic Secure Chat After hours: [phone].  , 3:45 PM

## 2024-01-07 NOTE — Significant Event (Signed)
Rapid Response Event Note   Reason for Call :  Hypotension and ventricular ectopy, decreased LOC  Initial Focused Assessment:  Patient is poorly responsive. BP 59/49  HR 44  RR 20  O2 sat 89 on [place]   Dr Shirlee Latch and team at bedside  Interventions:  Levophed started at per orders, increased to 12 mcg per orders Placed patient on NRB  Placed patient on Zoll   BP 91/75  HR 78  O2 sat 96% on NRB  Transported to [id number] with Cardiology team at bedside.  82M Staff at bedside to receive patient CCM MD came to bedside  Plan of Care:     Event Summary:   MD Notified: Dr Shirlee Latch at bedside Call Time: 1026 Arrival Time: 1028 End Time: 1100  Marcellina Millin, RN

## 2024-01-07 DEATH — deceased

## 2024-01-16 ENCOUNTER — Encounter: Payer: 59 | Admitting: Student

## 2024-01-16 ENCOUNTER — Other Ambulatory Visit: Payer: Self-pay | Admitting: Internal Medicine

## 2024-01-23 ENCOUNTER — Other Ambulatory Visit: Payer: Self-pay | Admitting: Internal Medicine

## 2024-01-26 ENCOUNTER — Encounter (HOSPITAL_COMMUNITY): Payer: 59

## 2024-02-07 MED FILL — Medication: Qty: 1 | Status: AC

## 2024-02-22 ENCOUNTER — Ambulatory Visit: Payer: 59 | Admitting: Gastroenterology

## 2024-02-24 ENCOUNTER — Ambulatory Visit: Payer: Medicare Other | Admitting: Internal Medicine

## 2024-07-06 NOTE — Procedures (Signed)
Mask fit
